# Patient Record
Sex: Male | Born: 1935
Health system: Southern US, Community
[De-identification: ages and names within clinical notes are randomized; demographics above are authoritative.]

## PROBLEM LIST (undated history)

## (undated) DIAGNOSIS — Z7901 Long term (current) use of anticoagulants: Secondary | ICD-10-CM

## (undated) DIAGNOSIS — Z974 Presence of external hearing-aid: Secondary | ICD-10-CM

## (undated) DIAGNOSIS — G709 Myoneural disorder, unspecified: Secondary | ICD-10-CM

## (undated) DIAGNOSIS — N183 Chronic kidney disease, stage 3 unspecified: Secondary | ICD-10-CM

## (undated) DIAGNOSIS — E781 Pure hyperglyceridemia: Secondary | ICD-10-CM

## (undated) DIAGNOSIS — I739 Peripheral vascular disease, unspecified: Secondary | ICD-10-CM

## (undated) DIAGNOSIS — I2510 Atherosclerotic heart disease of native coronary artery without angina pectoris: Secondary | ICD-10-CM

## (undated) DIAGNOSIS — Z972 Presence of dental prosthetic device (complete) (partial): Secondary | ICD-10-CM

## (undated) DIAGNOSIS — Z87442 Personal history of urinary calculi: Secondary | ICD-10-CM

## (undated) DIAGNOSIS — I10 Essential (primary) hypertension: Secondary | ICD-10-CM

## (undated) DIAGNOSIS — I4819 Other persistent atrial fibrillation: Secondary | ICD-10-CM

## (undated) DIAGNOSIS — H269 Unspecified cataract: Secondary | ICD-10-CM

## (undated) DIAGNOSIS — I4892 Unspecified atrial flutter: Secondary | ICD-10-CM

## (undated) DIAGNOSIS — G4733 Obstructive sleep apnea (adult) (pediatric): Secondary | ICD-10-CM

## (undated) HISTORY — DX: Atherosclerotic heart disease of native coronary artery without angina pectoris: I25.10

## (undated) HISTORY — PX: FRACTURE SURGERY: SHX138

## (undated) HISTORY — DX: Long term (current) use of anticoagulants: Z79.01

## (undated) HISTORY — DX: Pure hyperglyceridemia: E78.1

## (undated) HISTORY — DX: Chronic kidney disease, stage 3 (moderate): N18.3

## (undated) HISTORY — PX: OTHER SURGICAL HISTORY: SHX169

## (undated) HISTORY — DX: Myoneural disorder, unspecified: G70.9

## (undated) HISTORY — DX: Chronic kidney disease, stage 3 unspecified: N18.30

## (undated) HISTORY — DX: Peripheral vascular disease, unspecified: I73.9

## (undated) HISTORY — PX: CARPAL TUNNEL RELEASE: SHX101

## (undated) HISTORY — DX: Essential (primary) hypertension: I10

## (undated) HISTORY — DX: Obstructive sleep apnea (adult) (pediatric): G47.33

## (undated) HISTORY — PX: BLEPHAROPLASTY: SUR158

## (undated) HISTORY — DX: Unspecified atrial flutter: I48.92

## (undated) HISTORY — DX: Other persistent atrial fibrillation: I48.19

## (undated) HISTORY — DX: Unspecified cataract: H26.9

---

## 1997-08-10 HISTORY — PX: ANGIOPLASTY: SHX39

## 1997-11-19 ENCOUNTER — Encounter: Payer: Self-pay | Admitting: Family Medicine

## 1997-11-19 LAB — CONVERTED CEMR LAB: PSA: 0.4 ng/mL

## 1998-08-10 HISTORY — PX: CORONARY ARTERY BYPASS GRAFT: SHX141

## 1998-08-10 HISTORY — PX: CARDIAC CATHETERIZATION: SHX172

## 1998-09-10 ENCOUNTER — Observation Stay (HOSPITAL_COMMUNITY): Admission: AD | Admit: 1998-09-10 | Discharge: 1998-09-11 | Payer: Self-pay | Admitting: *Deleted

## 1998-09-18 ENCOUNTER — Encounter: Payer: Self-pay | Admitting: Emergency Medicine

## 1998-09-18 ENCOUNTER — Emergency Department (HOSPITAL_COMMUNITY): Admission: EM | Admit: 1998-09-18 | Discharge: 1998-09-18 | Payer: Self-pay | Admitting: Emergency Medicine

## 1999-01-20 ENCOUNTER — Emergency Department (HOSPITAL_COMMUNITY): Admission: EM | Admit: 1999-01-20 | Discharge: 1999-01-20 | Payer: Self-pay | Admitting: Emergency Medicine

## 1999-01-29 ENCOUNTER — Emergency Department (HOSPITAL_COMMUNITY): Admission: EM | Admit: 1999-01-29 | Discharge: 1999-01-29 | Payer: Self-pay | Admitting: Emergency Medicine

## 1999-04-15 ENCOUNTER — Inpatient Hospital Stay (HOSPITAL_COMMUNITY): Admission: AD | Admit: 1999-04-15 | Discharge: 1999-04-28 | Payer: Self-pay | Admitting: Cardiology

## 1999-04-16 ENCOUNTER — Encounter: Payer: Self-pay | Admitting: Surgery

## 1999-04-17 ENCOUNTER — Encounter: Payer: Self-pay | Admitting: Surgery

## 1999-04-18 ENCOUNTER — Encounter: Payer: Self-pay | Admitting: Surgery

## 1999-04-19 ENCOUNTER — Encounter: Payer: Self-pay | Admitting: Surgery

## 2000-05-12 ENCOUNTER — Encounter: Payer: Self-pay | Admitting: Internal Medicine

## 2000-05-12 ENCOUNTER — Ambulatory Visit (HOSPITAL_COMMUNITY): Admission: RE | Admit: 2000-05-12 | Discharge: 2000-05-13 | Payer: Self-pay | Admitting: Internal Medicine

## 2001-11-08 HISTORY — PX: CORONARY ANGIOPLASTY: SHX604

## 2001-11-29 ENCOUNTER — Inpatient Hospital Stay (HOSPITAL_COMMUNITY): Admission: AD | Admit: 2001-11-29 | Discharge: 2001-12-06 | Payer: Self-pay | Admitting: Cardiology

## 2001-11-29 ENCOUNTER — Encounter: Payer: Self-pay | Admitting: Cardiology

## 2002-08-10 HISTORY — PX: CARDIOVERSION: SHX1299

## 2002-09-07 ENCOUNTER — Ambulatory Visit (HOSPITAL_COMMUNITY): Admission: RE | Admit: 2002-09-07 | Discharge: 2002-09-07 | Payer: Self-pay | Admitting: Cardiology

## 2003-12-26 ENCOUNTER — Encounter: Admission: RE | Admit: 2003-12-26 | Discharge: 2003-12-26 | Payer: Self-pay | Admitting: Cardiology

## 2004-01-08 ENCOUNTER — Ambulatory Visit (HOSPITAL_COMMUNITY): Admission: RE | Admit: 2004-01-08 | Discharge: 2004-01-08 | Payer: Self-pay | Admitting: Cardiology

## 2004-01-10 ENCOUNTER — Emergency Department (HOSPITAL_COMMUNITY): Admission: EM | Admit: 2004-01-10 | Discharge: 2004-01-10 | Payer: Self-pay | Admitting: Emergency Medicine

## 2004-06-11 ENCOUNTER — Ambulatory Visit: Payer: Self-pay | Admitting: *Deleted

## 2004-07-02 ENCOUNTER — Ambulatory Visit: Payer: Self-pay | Admitting: Cardiology

## 2004-07-16 ENCOUNTER — Ambulatory Visit: Payer: Self-pay | Admitting: *Deleted

## 2004-07-22 ENCOUNTER — Ambulatory Visit: Payer: Self-pay | Admitting: Cardiology

## 2004-08-06 ENCOUNTER — Ambulatory Visit: Payer: Self-pay | Admitting: Internal Medicine

## 2004-08-20 ENCOUNTER — Ambulatory Visit: Payer: Self-pay | Admitting: Internal Medicine

## 2004-09-03 ENCOUNTER — Ambulatory Visit: Payer: Self-pay | Admitting: Cardiology

## 2004-09-16 ENCOUNTER — Encounter: Payer: Self-pay | Admitting: Family Medicine

## 2004-09-16 ENCOUNTER — Ambulatory Visit: Payer: Self-pay | Admitting: Family Medicine

## 2004-09-16 LAB — CONVERTED CEMR LAB: PSA: 0.55 ng/mL

## 2004-10-01 ENCOUNTER — Ambulatory Visit: Payer: Self-pay | Admitting: Cardiology

## 2004-10-08 HISTORY — PX: OTHER SURGICAL HISTORY: SHX169

## 2004-10-16 ENCOUNTER — Ambulatory Visit: Payer: Self-pay | Admitting: Cardiology

## 2004-10-22 ENCOUNTER — Ambulatory Visit: Payer: Self-pay

## 2004-10-29 ENCOUNTER — Ambulatory Visit: Payer: Self-pay | Admitting: Cardiovascular Disease

## 2004-11-26 ENCOUNTER — Ambulatory Visit: Payer: Self-pay | Admitting: Internal Medicine

## 2004-12-15 ENCOUNTER — Ambulatory Visit: Payer: Self-pay | Admitting: Family Medicine

## 2004-12-24 ENCOUNTER — Ambulatory Visit: Payer: Self-pay | Admitting: Cardiovascular Disease

## 2004-12-30 ENCOUNTER — Ambulatory Visit: Payer: Self-pay | Admitting: Internal Medicine

## 2005-01-14 ENCOUNTER — Ambulatory Visit: Payer: Self-pay | Admitting: Cardiovascular Disease

## 2005-01-30 ENCOUNTER — Ambulatory Visit: Payer: Self-pay | Admitting: Cardiology

## 2005-02-20 ENCOUNTER — Ambulatory Visit: Payer: Self-pay | Admitting: Cardiovascular Disease

## 2005-03-20 ENCOUNTER — Ambulatory Visit: Payer: Self-pay | Admitting: Cardiology

## 2005-04-15 ENCOUNTER — Ambulatory Visit: Payer: Self-pay | Admitting: Cardiology

## 2005-04-15 ENCOUNTER — Ambulatory Visit: Payer: Self-pay | Admitting: Cardiovascular Disease

## 2005-05-13 ENCOUNTER — Ambulatory Visit: Payer: Self-pay | Admitting: Cardiology

## 2005-05-27 ENCOUNTER — Ambulatory Visit: Payer: Self-pay | Admitting: Cardiovascular Disease

## 2005-06-10 ENCOUNTER — Ambulatory Visit: Payer: Self-pay | Admitting: Family Medicine

## 2005-06-17 ENCOUNTER — Ambulatory Visit: Payer: Self-pay | Admitting: Cardiology

## 2005-06-30 ENCOUNTER — Ambulatory Visit: Payer: Self-pay | Admitting: Cardiology

## 2005-07-07 ENCOUNTER — Ambulatory Visit: Payer: Self-pay | Admitting: *Deleted

## 2005-07-21 ENCOUNTER — Ambulatory Visit: Payer: Self-pay | Admitting: Cardiology

## 2005-08-05 ENCOUNTER — Ambulatory Visit: Payer: Self-pay | Admitting: Cardiology

## 2005-08-07 ENCOUNTER — Ambulatory Visit: Payer: Self-pay | Admitting: Family Medicine

## 2005-08-17 ENCOUNTER — Ambulatory Visit: Payer: Self-pay | Admitting: Family Medicine

## 2005-08-26 ENCOUNTER — Ambulatory Visit: Payer: Self-pay | Admitting: Cardiovascular Disease

## 2005-09-10 ENCOUNTER — Ambulatory Visit: Payer: Self-pay | Admitting: Cardiology

## 2005-10-01 ENCOUNTER — Ambulatory Visit: Payer: Self-pay | Admitting: Cardiology

## 2005-10-15 ENCOUNTER — Ambulatory Visit: Payer: Self-pay | Admitting: Cardiology

## 2005-10-29 ENCOUNTER — Ambulatory Visit: Payer: Self-pay | Admitting: Cardiology

## 2005-11-12 ENCOUNTER — Ambulatory Visit: Payer: Self-pay | Admitting: Cardiology

## 2005-11-26 ENCOUNTER — Ambulatory Visit: Payer: Self-pay | Admitting: Cardiology

## 2005-12-08 HISTORY — PX: CARDIOVERSION: SHX1299

## 2005-12-10 ENCOUNTER — Ambulatory Visit: Payer: Self-pay | Admitting: Cardiology

## 2005-12-10 ENCOUNTER — Inpatient Hospital Stay (HOSPITAL_COMMUNITY): Admission: EM | Admit: 2005-12-10 | Discharge: 2005-12-11 | Payer: Self-pay | Admitting: Emergency Medicine

## 2005-12-10 ENCOUNTER — Ambulatory Visit: Payer: Self-pay | Admitting: Family Medicine

## 2005-12-17 ENCOUNTER — Ambulatory Visit: Payer: Self-pay | Admitting: Cardiology

## 2006-01-05 ENCOUNTER — Ambulatory Visit: Payer: Self-pay | Admitting: Cardiology

## 2006-01-05 ENCOUNTER — Ambulatory Visit: Payer: Self-pay | Admitting: Internal Medicine

## 2006-01-19 ENCOUNTER — Ambulatory Visit: Payer: Self-pay | Admitting: *Deleted

## 2006-01-27 ENCOUNTER — Ambulatory Visit: Payer: Self-pay | Admitting: Internal Medicine

## 2006-02-16 ENCOUNTER — Ambulatory Visit: Payer: Self-pay | Admitting: Cardiology

## 2006-03-16 ENCOUNTER — Ambulatory Visit: Payer: Self-pay | Admitting: Cardiology

## 2006-04-06 ENCOUNTER — Ambulatory Visit: Payer: Self-pay | Admitting: Family Medicine

## 2006-04-13 ENCOUNTER — Ambulatory Visit: Payer: Self-pay | Admitting: Cardiovascular Disease

## 2006-04-20 ENCOUNTER — Ambulatory Visit: Payer: Self-pay | Admitting: Cardiology

## 2006-04-21 ENCOUNTER — Ambulatory Visit: Payer: Self-pay | Admitting: Family Medicine

## 2006-04-27 ENCOUNTER — Ambulatory Visit: Payer: Self-pay | Admitting: Cardiology

## 2006-04-28 ENCOUNTER — Ambulatory Visit: Payer: Self-pay | Admitting: Family Medicine

## 2006-05-25 ENCOUNTER — Ambulatory Visit: Payer: Self-pay | Admitting: Cardiology

## 2006-05-26 ENCOUNTER — Ambulatory Visit: Payer: Self-pay | Admitting: Family Medicine

## 2006-06-08 ENCOUNTER — Ambulatory Visit: Payer: Self-pay | Admitting: Cardiology

## 2006-07-06 ENCOUNTER — Ambulatory Visit: Payer: Self-pay | Admitting: Cardiology

## 2006-07-16 ENCOUNTER — Ambulatory Visit: Payer: Self-pay | Admitting: Cardiology

## 2006-07-27 ENCOUNTER — Emergency Department (HOSPITAL_COMMUNITY): Admission: EM | Admit: 2006-07-27 | Discharge: 2006-07-27 | Payer: Self-pay | Admitting: Emergency Medicine

## 2006-07-29 ENCOUNTER — Ambulatory Visit: Payer: Self-pay | Admitting: Cardiology

## 2006-07-30 ENCOUNTER — Ambulatory Visit: Payer: Self-pay | Admitting: Cardiovascular Disease

## 2006-08-16 ENCOUNTER — Ambulatory Visit: Payer: Self-pay | Admitting: Family Medicine

## 2006-08-18 ENCOUNTER — Ambulatory Visit: Payer: Self-pay

## 2006-08-20 ENCOUNTER — Ambulatory Visit: Payer: Self-pay | Admitting: Cardiology

## 2006-08-27 ENCOUNTER — Ambulatory Visit: Payer: Self-pay | Admitting: Cardiovascular Disease

## 2006-09-07 ENCOUNTER — Ambulatory Visit: Payer: Self-pay | Admitting: Family Medicine

## 2006-09-07 LAB — CONVERTED CEMR LAB
AST: 34 units/L (ref 0–37)
BUN: 15 mg/dL (ref 6–23)
Basophils Absolute: 0.1 10*3/uL (ref 0.0–0.1)
CO2: 29 meq/L (ref 19–32)
Chloride: 106 meq/L (ref 96–112)
Creatinine, Ser: 1.3 mg/dL (ref 0.4–1.5)
Eosinophils Absolute: 0.1 10*3/uL (ref 0.0–0.6)
GFR calc non Af Amer: 57 mL/min
Glucose, Bld: 135 mg/dL — ABNORMAL HIGH (ref 70–99)
HDL: 28.3 mg/dL — ABNORMAL LOW (ref >39.0)
MCV: 90.1 fL (ref 78.0–100.0)
Monocytes Relative: 8.9 % (ref 3.0–11.0)
Platelets: 154 10*3/uL (ref 150–400)
Potassium: 5.1 meq/L (ref 3.5–5.1)
Sodium: 140 meq/L (ref 135–145)
TSH: 1.88 microintl units/mL (ref 0.35–5.50)
Total CHOL/HDL Ratio: 4.7
Total Protein: 6.4 g/dL (ref 6.0–8.3)
Triglycerides: 460 mg/dL (ref 0–149)
WBC: 6.3 10*3/uL (ref 4.5–10.5)

## 2006-09-10 ENCOUNTER — Ambulatory Visit: Payer: Self-pay | Admitting: Family Medicine

## 2006-09-10 ENCOUNTER — Ambulatory Visit: Payer: Self-pay | Admitting: Cardiology

## 2006-09-21 ENCOUNTER — Ambulatory Visit: Payer: Self-pay | Admitting: Gastroenterology

## 2006-10-01 ENCOUNTER — Encounter (INDEPENDENT_AMBULATORY_CARE_PROVIDER_SITE_OTHER): Payer: Self-pay | Admitting: Specialist

## 2006-10-01 ENCOUNTER — Ambulatory Visit: Payer: Self-pay | Admitting: Gastroenterology

## 2006-10-01 ENCOUNTER — Encounter (INDEPENDENT_AMBULATORY_CARE_PROVIDER_SITE_OTHER): Payer: Self-pay | Admitting: *Deleted

## 2006-10-01 HISTORY — PX: COLONOSCOPY W/ BIOPSIES: SHX1374

## 2006-10-08 ENCOUNTER — Ambulatory Visit: Payer: Self-pay | Admitting: Cardiology

## 2006-10-15 ENCOUNTER — Ambulatory Visit: Payer: Self-pay | Admitting: Family Medicine

## 2006-10-15 LAB — CONVERTED CEMR LAB
ALT: 30 units/L (ref 0–40)
HDL: 31.5 mg/dL — ABNORMAL LOW (ref >39.0)
Hgb A1c MFr Bld: 7 %
Hgb A1c MFr Bld: 7 % — ABNORMAL HIGH (ref 4.6–6.0)
Total CHOL/HDL Ratio: 4.2
Triglycerides: 292 mg/dL (ref 0–149)
VLDL: 58 mg/dL — ABNORMAL HIGH (ref 0–40)

## 2006-10-22 ENCOUNTER — Ambulatory Visit: Payer: Self-pay | Admitting: Cardiology

## 2006-11-02 ENCOUNTER — Ambulatory Visit: Payer: Self-pay | Admitting: Family Medicine

## 2006-11-19 ENCOUNTER — Ambulatory Visit: Payer: Self-pay | Admitting: Internal Medicine

## 2006-11-24 ENCOUNTER — Encounter: Payer: Self-pay | Admitting: Family Medicine

## 2006-11-24 DIAGNOSIS — E781 Pure hyperglyceridemia: Secondary | ICD-10-CM

## 2006-11-24 DIAGNOSIS — E785 Hyperlipidemia, unspecified: Secondary | ICD-10-CM | POA: Insufficient documentation

## 2006-11-24 DIAGNOSIS — I10 Essential (primary) hypertension: Secondary | ICD-10-CM

## 2006-11-24 DIAGNOSIS — E1140 Type 2 diabetes mellitus with diabetic neuropathy, unspecified: Secondary | ICD-10-CM

## 2006-11-26 ENCOUNTER — Ambulatory Visit: Payer: Self-pay | Admitting: Cardiology

## 2006-12-08 ENCOUNTER — Ambulatory Visit: Payer: Self-pay | Admitting: Internal Medicine

## 2006-12-13 ENCOUNTER — Ambulatory Visit: Payer: Self-pay | Admitting: Family Medicine

## 2006-12-13 LAB — CONVERTED CEMR LAB
BUN: 15 mg/dL (ref 6–23)
CO2: 29 meq/L (ref 19–32)
Calcium: 8.8 mg/dL (ref 8.4–10.5)
Chloride: 106 meq/L (ref 96–112)
GFR calc Af Amer: 76 mL/min
GFR calc non Af Amer: 63 mL/min
Potassium: 4.6 meq/L (ref 3.5–5.1)
Sodium: 141 meq/L (ref 135–145)

## 2006-12-15 ENCOUNTER — Ambulatory Visit: Payer: Self-pay | Admitting: Family Medicine

## 2006-12-22 ENCOUNTER — Ambulatory Visit: Payer: Self-pay | Admitting: Cardiovascular Disease

## 2007-01-10 ENCOUNTER — Ambulatory Visit: Payer: Self-pay | Admitting: Internal Medicine

## 2007-02-08 ENCOUNTER — Ambulatory Visit: Payer: Self-pay | Admitting: Cardiology

## 2007-02-18 ENCOUNTER — Encounter: Payer: Self-pay | Admitting: Family Medicine

## 2007-02-18 ENCOUNTER — Ambulatory Visit: Payer: Self-pay

## 2007-02-18 HISTORY — PX: OTHER SURGICAL HISTORY: SHX169

## 2007-03-10 ENCOUNTER — Ambulatory Visit: Payer: Self-pay | Admitting: Cardiology

## 2007-03-11 ENCOUNTER — Ambulatory Visit: Payer: Self-pay | Admitting: Family Medicine

## 2007-03-11 LAB — CONVERTED CEMR LAB
Calcium: 9.2 mg/dL (ref 8.4–10.5)
Hgb A1c MFr Bld: 6.2 % — ABNORMAL HIGH (ref 4.6–6.0)
Sodium: 142 meq/L (ref 135–145)

## 2007-03-18 ENCOUNTER — Ambulatory Visit: Payer: Self-pay | Admitting: Family Medicine

## 2007-04-12 ENCOUNTER — Ambulatory Visit: Payer: Self-pay | Admitting: Internal Medicine

## 2007-05-10 ENCOUNTER — Ambulatory Visit: Payer: Self-pay | Admitting: Cardiology

## 2007-05-20 ENCOUNTER — Ambulatory Visit: Payer: Self-pay | Admitting: Family Medicine

## 2007-06-06 ENCOUNTER — Ambulatory Visit: Payer: Self-pay | Admitting: Cardiology

## 2007-06-17 ENCOUNTER — Ambulatory Visit: Payer: Self-pay | Admitting: Family Medicine

## 2007-06-28 ENCOUNTER — Ambulatory Visit: Payer: Self-pay | Admitting: Family Medicine

## 2007-07-06 ENCOUNTER — Ambulatory Visit: Payer: Self-pay | Admitting: Cardiology

## 2007-07-13 ENCOUNTER — Telehealth (INDEPENDENT_AMBULATORY_CARE_PROVIDER_SITE_OTHER): Payer: Self-pay | Admitting: *Deleted

## 2007-07-18 ENCOUNTER — Encounter: Payer: Self-pay | Admitting: Family Medicine

## 2007-07-21 ENCOUNTER — Telehealth (INDEPENDENT_AMBULATORY_CARE_PROVIDER_SITE_OTHER): Payer: Self-pay | Admitting: *Deleted

## 2007-07-27 ENCOUNTER — Ambulatory Visit: Payer: Self-pay | Admitting: Family Medicine

## 2007-07-27 ENCOUNTER — Ambulatory Visit: Payer: Self-pay | Admitting: Cardiology

## 2007-08-16 ENCOUNTER — Ambulatory Visit: Payer: Self-pay | Admitting: Cardiology

## 2007-08-23 ENCOUNTER — Ambulatory Visit: Payer: Self-pay | Admitting: Cardiovascular Disease

## 2007-09-14 ENCOUNTER — Ambulatory Visit: Payer: Self-pay | Admitting: Family Medicine

## 2007-09-14 LAB — CONVERTED CEMR LAB
AST: 37 units/L (ref 0–37)
Bilirubin, Direct: 0.1 mg/dL (ref 0.0–0.3)
Calcium: 9.6 mg/dL (ref 8.4–10.5)
Chloride: 103 meq/L (ref 96–112)
Cholesterol: 145 mg/dL (ref 0–200)
Creatinine, Ser: 1.1 mg/dL (ref 0.4–1.5)
Creatinine,U: 77.8 mg/dL
HDL: 34.2 mg/dL — ABNORMAL LOW (ref >39.0)
Microalb Creat Ratio: 60.4 mg/g — ABNORMAL HIGH (ref 0.0–30.0)
Microalb, Ur: 4.7 mg/dL — ABNORMAL HIGH (ref 0.0–1.9)
Potassium: 4.7 meq/L (ref 3.5–5.1)
Total Bilirubin: 1.1 mg/dL (ref 0.3–1.2)
Total Protein: 7.2 g/dL (ref 6.0–8.3)
Triglycerides: 291 mg/dL (ref 0–149)
VLDL: 58 mg/dL — ABNORMAL HIGH (ref 0–40)

## 2007-09-20 ENCOUNTER — Ambulatory Visit: Payer: Self-pay | Admitting: Internal Medicine

## 2007-10-12 ENCOUNTER — Ambulatory Visit: Payer: Self-pay | Admitting: Internal Medicine

## 2007-10-26 ENCOUNTER — Ambulatory Visit: Payer: Self-pay | Admitting: Cardiology

## 2007-10-27 ENCOUNTER — Ambulatory Visit: Payer: Self-pay | Admitting: Family Medicine

## 2007-11-16 ENCOUNTER — Ambulatory Visit: Payer: Self-pay | Admitting: Internal Medicine

## 2007-11-29 ENCOUNTER — Ambulatory Visit: Payer: Self-pay | Admitting: Cardiovascular Disease

## 2007-12-13 ENCOUNTER — Ambulatory Visit: Payer: Self-pay | Admitting: Cardiology

## 2007-12-22 ENCOUNTER — Ambulatory Visit: Payer: Self-pay | Admitting: Family Medicine

## 2007-12-22 LAB — CONVERTED CEMR LAB
Chloride: 103 meq/L (ref 96–112)
Creatinine, Ser: 1.1 mg/dL (ref 0.4–1.5)
GFR calc Af Amer: 83 mL/min
Glucose, Bld: 106 mg/dL — ABNORMAL HIGH (ref 70–99)
Potassium: 4.7 meq/L (ref 3.5–5.1)

## 2007-12-27 ENCOUNTER — Ambulatory Visit: Payer: Self-pay | Admitting: Internal Medicine

## 2008-01-17 ENCOUNTER — Ambulatory Visit: Payer: Self-pay | Admitting: Cardiovascular Disease

## 2008-02-01 ENCOUNTER — Telehealth: Payer: Self-pay | Admitting: Family Medicine

## 2008-02-07 ENCOUNTER — Ambulatory Visit: Payer: Self-pay | Admitting: Family Medicine

## 2008-02-07 LAB — CONVERTED CEMR LAB
ALT: 23 units/L (ref 0–53)
Alkaline Phosphatase: 42 units/L (ref 39–117)
Basophils Absolute: 0 10*3/uL (ref 0.0–0.1)
Basophils Relative: 0.5 % (ref 0.0–1.0)
Chloride: 98 meq/L (ref 96–112)
Eosinophils Relative: 0.5 % (ref 0.0–5.0)
Glucose, Bld: 133 mg/dL
Lymphocytes Relative: 8.1 % — ABNORMAL LOW (ref 12.0–46.0)
Monocytes Relative: 7.9 % (ref 3.0–12.0)
Neutro Abs: 8 10*3/uL — ABNORMAL HIGH (ref 1.4–7.7)
Potassium: 4.3 meq/L (ref 3.5–5.1)
Protein, U semiquant: 30
Sodium: 133 meq/L — ABNORMAL LOW (ref 135–145)
Specific Gravity, Urine: 1.01
Total Protein: 7 g/dL (ref 6.0–8.3)
Urobilinogen, UA: 0.2
WBC Urine, dipstick: NEGATIVE
WBC: 9.6 10*3/uL (ref 4.5–10.5)

## 2008-02-08 ENCOUNTER — Encounter: Payer: Self-pay | Admitting: Family Medicine

## 2008-02-08 ENCOUNTER — Telehealth: Payer: Self-pay | Admitting: Family Medicine

## 2008-02-09 ENCOUNTER — Ambulatory Visit: Payer: Self-pay | Admitting: Cardiology

## 2008-02-14 ENCOUNTER — Ambulatory Visit: Payer: Self-pay | Admitting: Cardiology

## 2008-03-08 ENCOUNTER — Ambulatory Visit: Payer: Self-pay | Admitting: Internal Medicine

## 2008-03-12 ENCOUNTER — Ambulatory Visit: Payer: Self-pay | Admitting: Family Medicine

## 2008-03-16 ENCOUNTER — Ambulatory Visit: Payer: Self-pay | Admitting: Cardiology

## 2008-03-26 ENCOUNTER — Ambulatory Visit: Payer: Self-pay | Admitting: Cardiology

## 2008-03-26 ENCOUNTER — Ambulatory Visit: Payer: Self-pay | Admitting: Internal Medicine

## 2008-03-26 LAB — CONVERTED CEMR LAB
CO2: 32 meq/L (ref 19–32)
Chloride: 105 meq/L (ref 96–112)
Sodium: 140 meq/L (ref 135–145)

## 2008-03-27 ENCOUNTER — Ambulatory Visit: Payer: Self-pay | Admitting: Family Medicine

## 2008-04-09 ENCOUNTER — Ambulatory Visit: Payer: Self-pay | Admitting: Internal Medicine

## 2008-04-30 ENCOUNTER — Ambulatory Visit: Payer: Self-pay | Admitting: Internal Medicine

## 2008-05-15 ENCOUNTER — Ambulatory Visit: Payer: Self-pay | Admitting: Cardiology

## 2008-05-21 ENCOUNTER — Ambulatory Visit: Payer: Self-pay | Admitting: Family Medicine

## 2008-06-12 ENCOUNTER — Ambulatory Visit: Payer: Self-pay | Admitting: Internal Medicine

## 2008-07-03 ENCOUNTER — Ambulatory Visit: Payer: Self-pay | Admitting: Cardiovascular Disease

## 2008-07-17 ENCOUNTER — Ambulatory Visit: Payer: Self-pay | Admitting: Cardiology

## 2008-08-07 ENCOUNTER — Ambulatory Visit: Payer: Self-pay | Admitting: Cardiology

## 2008-08-21 ENCOUNTER — Ambulatory Visit: Payer: Self-pay | Admitting: Internal Medicine

## 2008-09-04 ENCOUNTER — Ambulatory Visit: Payer: Self-pay | Admitting: Cardiology

## 2008-09-12 ENCOUNTER — Ambulatory Visit: Payer: Self-pay | Admitting: Family Medicine

## 2008-09-12 ENCOUNTER — Ambulatory Visit: Payer: Self-pay | Admitting: Cardiology

## 2008-09-12 LAB — CONVERTED CEMR LAB
AST: 35 units/L (ref 0–37)
BUN: 18 mg/dL (ref 6–23)
Basophils Absolute: 0 10*3/uL (ref 0.0–0.1)
Bilirubin, Direct: 0.1 mg/dL (ref 0.0–0.3)
CO2: 31 meq/L (ref 19–32)
Cholesterol: 156 mg/dL (ref 0–200)
Creatinine, Ser: 1.1 mg/dL (ref 0.4–1.5)
Direct LDL: 45.6 mg/dL
Eosinophils Absolute: 0.2 10*3/uL (ref 0.0–0.7)
Glucose, Bld: 117 mg/dL — ABNORMAL HIGH (ref 70–99)
Hemoglobin: 14.7 g/dL (ref 13.0–17.0)
Lymphocytes Relative: 27.8 % (ref 12.0–46.0)
Microalb Creat Ratio: 201.6 mg/g — ABNORMAL HIGH (ref 0.0–30.0)
Monocytes Absolute: 0.6 10*3/uL (ref 0.1–1.0)
Neutro Abs: 4.3 10*3/uL (ref 1.4–7.7)
Neutrophils Relative %: 61.2 % (ref 43.0–77.0)
Platelets: 159 10*3/uL (ref 150–400)
RDW: 13.7 % (ref 11.5–14.6)
TSH: 2.02 microintl units/mL (ref 0.35–5.50)
Total Bilirubin: 0.7 mg/dL (ref 0.3–1.2)
Total Protein: 6.8 g/dL (ref 6.0–8.3)
Triglycerides: 523 mg/dL (ref 0–149)
VLDL: 105 mg/dL — ABNORMAL HIGH (ref 0–40)
WBC: 7.1 10*3/uL (ref 4.5–10.5)

## 2008-09-18 ENCOUNTER — Ambulatory Visit: Payer: Self-pay | Admitting: Family Medicine

## 2008-09-19 ENCOUNTER — Telehealth: Payer: Self-pay | Admitting: Family Medicine

## 2008-09-24 ENCOUNTER — Ambulatory Visit: Payer: Self-pay | Admitting: Cardiology

## 2008-10-01 ENCOUNTER — Ambulatory Visit: Payer: Self-pay | Admitting: Cardiology

## 2008-10-22 ENCOUNTER — Ambulatory Visit: Payer: Self-pay | Admitting: Cardiovascular Disease

## 2008-11-01 ENCOUNTER — Ambulatory Visit: Payer: Self-pay | Admitting: Family Medicine

## 2008-11-01 LAB — CONVERTED CEMR LAB
ALT: 24 units/L (ref 0–53)
AST: 31 units/L (ref 0–37)

## 2008-11-08 ENCOUNTER — Encounter: Payer: Self-pay | Admitting: Family Medicine

## 2008-11-08 ENCOUNTER — Telehealth: Payer: Self-pay | Admitting: Family Medicine

## 2008-11-12 ENCOUNTER — Ambulatory Visit: Payer: Self-pay | Admitting: Cardiology

## 2008-12-05 ENCOUNTER — Ambulatory Visit: Payer: Self-pay | Admitting: Family Medicine

## 2008-12-10 ENCOUNTER — Ambulatory Visit: Payer: Self-pay | Admitting: Internal Medicine

## 2008-12-25 DIAGNOSIS — I4891 Unspecified atrial fibrillation: Secondary | ICD-10-CM | POA: Insufficient documentation

## 2008-12-25 DIAGNOSIS — I4892 Unspecified atrial flutter: Secondary | ICD-10-CM

## 2008-12-31 ENCOUNTER — Ambulatory Visit: Payer: Self-pay | Admitting: Cardiology

## 2009-01-08 ENCOUNTER — Encounter: Payer: Self-pay | Admitting: *Deleted

## 2009-01-10 ENCOUNTER — Ambulatory Visit: Payer: Self-pay | Admitting: Cardiology

## 2009-01-10 LAB — CONVERTED CEMR LAB: POC INR: 2.7

## 2009-02-07 ENCOUNTER — Ambulatory Visit: Payer: Self-pay | Admitting: Internal Medicine

## 2009-02-08 ENCOUNTER — Ambulatory Visit: Payer: Self-pay | Admitting: Cardiology

## 2009-02-13 ENCOUNTER — Encounter: Payer: Self-pay | Admitting: *Deleted

## 2009-02-15 ENCOUNTER — Ambulatory Visit: Payer: Self-pay

## 2009-02-22 ENCOUNTER — Ambulatory Visit: Payer: Self-pay | Admitting: Cardiology

## 2009-02-27 ENCOUNTER — Ambulatory Visit: Payer: Self-pay | Admitting: Family Medicine

## 2009-02-27 LAB — CONVERTED CEMR LAB
BUN: 26 mg/dL — ABNORMAL HIGH (ref 6–23)
CO2: 28 meq/L (ref 19–32)
Chloride: 106 meq/L (ref 96–112)
GFR calc non Af Amer: 44.55 mL/min (ref >60)
Glucose, Bld: 151 mg/dL — ABNORMAL HIGH (ref 70–99)
Hgb A1c MFr Bld: 6.2 % (ref 4.6–6.5)
Sodium: 140 meq/L (ref 135–145)
Vitamin B-12: 119 pg/mL — ABNORMAL LOW (ref 211–911)

## 2009-03-04 ENCOUNTER — Ambulatory Visit: Payer: Self-pay | Admitting: Family Medicine

## 2009-03-04 DIAGNOSIS — E538 Deficiency of other specified B group vitamins: Secondary | ICD-10-CM

## 2009-03-07 ENCOUNTER — Ambulatory Visit: Payer: Self-pay | Admitting: Cardiovascular Disease

## 2009-03-07 LAB — CONVERTED CEMR LAB
POC INR: 3.3
Prothrombin Time: 21.8 s

## 2009-04-04 ENCOUNTER — Ambulatory Visit: Payer: Self-pay | Admitting: Internal Medicine

## 2009-04-04 LAB — CONVERTED CEMR LAB: POC INR: 3.2

## 2009-04-05 ENCOUNTER — Ambulatory Visit: Payer: Self-pay | Admitting: Family Medicine

## 2009-04-06 LAB — CONVERTED CEMR LAB
HDL: 28.8 mg/dL — ABNORMAL LOW (ref >39.00)
Total CHOL/HDL Ratio: 6
Triglycerides: 168 mg/dL — ABNORMAL HIGH (ref 0.0–149.0)

## 2009-04-17 ENCOUNTER — Ambulatory Visit: Payer: Self-pay | Admitting: Family Medicine

## 2009-04-18 ENCOUNTER — Ambulatory Visit: Payer: Self-pay | Admitting: Cardiology

## 2009-05-01 ENCOUNTER — Ambulatory Visit: Payer: Self-pay | Admitting: Family Medicine

## 2009-05-16 ENCOUNTER — Ambulatory Visit: Payer: Self-pay | Admitting: Cardiology

## 2009-05-16 LAB — CONVERTED CEMR LAB: POC INR: 2.5

## 2009-05-17 ENCOUNTER — Ambulatory Visit: Payer: Self-pay | Admitting: Family Medicine

## 2009-05-21 ENCOUNTER — Ambulatory Visit: Payer: Self-pay | Admitting: Cardiology

## 2009-06-13 ENCOUNTER — Ambulatory Visit: Payer: Self-pay | Admitting: Internal Medicine

## 2009-06-13 LAB — CONVERTED CEMR LAB: POC INR: 2.7

## 2009-06-18 ENCOUNTER — Ambulatory Visit: Payer: Self-pay | Admitting: Family Medicine

## 2009-07-11 ENCOUNTER — Ambulatory Visit: Payer: Self-pay | Admitting: Cardiology

## 2009-07-19 ENCOUNTER — Ambulatory Visit: Payer: Self-pay | Admitting: Family Medicine

## 2009-07-26 ENCOUNTER — Telehealth: Payer: Self-pay | Admitting: Cardiology

## 2009-08-12 ENCOUNTER — Ambulatory Visit: Payer: Self-pay | Admitting: Cardiology

## 2009-08-12 LAB — CONVERTED CEMR LAB: POC INR: 2.3

## 2009-08-22 ENCOUNTER — Ambulatory Visit: Payer: Self-pay | Admitting: Family Medicine

## 2009-08-27 HISTORY — PX: HAND SURGERY: SHX662

## 2009-09-03 ENCOUNTER — Encounter (INDEPENDENT_AMBULATORY_CARE_PROVIDER_SITE_OTHER): Payer: Self-pay | Admitting: *Deleted

## 2009-09-09 ENCOUNTER — Encounter (INDEPENDENT_AMBULATORY_CARE_PROVIDER_SITE_OTHER): Payer: Self-pay | Admitting: Cardiology

## 2009-09-09 ENCOUNTER — Ambulatory Visit: Payer: Self-pay | Admitting: Cardiology

## 2009-09-09 LAB — CONVERTED CEMR LAB: POC INR: 1.7

## 2009-09-17 ENCOUNTER — Encounter (INDEPENDENT_AMBULATORY_CARE_PROVIDER_SITE_OTHER): Payer: Self-pay | Admitting: *Deleted

## 2009-09-20 ENCOUNTER — Ambulatory Visit: Payer: Self-pay | Admitting: Family Medicine

## 2009-09-21 LAB — CONVERTED CEMR LAB
AST: 36 units/L (ref 0–37)
Alkaline Phosphatase: 27 units/L — ABNORMAL LOW (ref 39–117)
Basophils Relative: 2.7 % (ref 0.0–3.0)
CO2: 27 meq/L (ref 19–32)
Cholesterol: 175 mg/dL (ref 0–200)
Creatinine,U: 85.6 mg/dL
GFR calc non Af Amer: 41.48 mL/min (ref >60)
Glucose, Bld: 104 mg/dL — ABNORMAL HIGH (ref 70–99)
HDL: 34.2 mg/dL — ABNORMAL LOW (ref >39.00)
Hemoglobin: 14.2 g/dL (ref 13.0–17.0)
Lymphocytes Relative: 26.2 % (ref 12.0–46.0)
MCV: 95.4 fL (ref 78.0–100.0)
Microalb, Ur: 3.4 mg/dL — ABNORMAL HIGH (ref 0.0–1.9)
Monocytes Relative: 6.7 % (ref 3.0–12.0)
Neutro Abs: 4.3 10*3/uL (ref 1.4–7.7)
Neutrophils Relative %: 61.9 % (ref 43.0–77.0)
PSA: 0.6 ng/mL (ref 0.10–4.00)
Platelets: 167 10*3/uL (ref 150.0–400.0)
Potassium: 5 meq/L (ref 3.5–5.1)
RDW: 14.1 % (ref 11.5–14.6)
Total Bilirubin: 0.4 mg/dL (ref 0.3–1.2)
Total CHOL/HDL Ratio: 5
Total Protein: 7 g/dL (ref 6.0–8.3)
Triglycerides: 203 mg/dL — ABNORMAL HIGH (ref 0.0–149.0)
Vit D, 25-Hydroxy: 26 ng/mL — ABNORMAL LOW (ref 30–89)
Vitamin B-12: 283 pg/mL (ref 211–911)

## 2009-09-25 ENCOUNTER — Ambulatory Visit: Payer: Self-pay | Admitting: Family Medicine

## 2009-09-30 ENCOUNTER — Telehealth: Payer: Self-pay | Admitting: Family Medicine

## 2009-09-30 ENCOUNTER — Ambulatory Visit: Payer: Self-pay | Admitting: Cardiovascular Disease

## 2009-09-30 LAB — CONVERTED CEMR LAB: POC INR: 3.4

## 2009-10-15 ENCOUNTER — Telehealth: Payer: Self-pay | Admitting: Family Medicine

## 2009-10-21 ENCOUNTER — Ambulatory Visit: Payer: Self-pay | Admitting: Cardiology

## 2009-10-25 ENCOUNTER — Encounter (INDEPENDENT_AMBULATORY_CARE_PROVIDER_SITE_OTHER): Payer: Self-pay | Admitting: *Deleted

## 2009-10-25 ENCOUNTER — Encounter: Payer: Self-pay | Admitting: Cardiology

## 2009-10-25 ENCOUNTER — Ambulatory Visit: Payer: Self-pay | Admitting: Gastroenterology

## 2009-10-25 DIAGNOSIS — Z860100 Personal history of colon polyps, unspecified: Secondary | ICD-10-CM | POA: Insufficient documentation

## 2009-11-05 ENCOUNTER — Telehealth (INDEPENDENT_AMBULATORY_CARE_PROVIDER_SITE_OTHER): Payer: Self-pay | Admitting: *Deleted

## 2009-11-08 ENCOUNTER — Ambulatory Visit: Payer: Self-pay | Admitting: Cardiology

## 2009-11-08 HISTORY — PX: NM MYOVIEW LTD: HXRAD82

## 2009-11-13 ENCOUNTER — Telehealth (INDEPENDENT_AMBULATORY_CARE_PROVIDER_SITE_OTHER): Payer: Self-pay | Admitting: *Deleted

## 2009-11-14 ENCOUNTER — Encounter: Payer: Self-pay | Admitting: Family Medicine

## 2009-11-14 ENCOUNTER — Ambulatory Visit: Payer: Self-pay | Admitting: Cardiology

## 2009-11-14 ENCOUNTER — Encounter (HOSPITAL_COMMUNITY): Admission: RE | Admit: 2009-11-14 | Discharge: 2010-01-08 | Payer: Self-pay | Admitting: Internal Medicine

## 2009-11-14 ENCOUNTER — Ambulatory Visit: Payer: Self-pay

## 2009-11-14 ENCOUNTER — Telehealth: Payer: Self-pay | Admitting: Family Medicine

## 2009-11-21 ENCOUNTER — Ambulatory Visit: Payer: Self-pay | Admitting: Family Medicine

## 2009-11-28 ENCOUNTER — Ambulatory Visit: Payer: Self-pay | Admitting: Internal Medicine

## 2009-12-04 ENCOUNTER — Ambulatory Visit: Payer: Self-pay | Admitting: Family Medicine

## 2009-12-04 LAB — CONVERTED CEMR LAB: AST: 39 units/L — ABNORMAL HIGH (ref 0–37)

## 2009-12-16 ENCOUNTER — Ambulatory Visit: Payer: Self-pay | Admitting: Internal Medicine

## 2009-12-18 ENCOUNTER — Encounter (INDEPENDENT_AMBULATORY_CARE_PROVIDER_SITE_OTHER): Payer: Self-pay | Admitting: *Deleted

## 2009-12-20 ENCOUNTER — Ambulatory Visit: Payer: Self-pay | Admitting: Family Medicine

## 2009-12-23 LAB — CONVERTED CEMR LAB
ALT: 25 units/L (ref 0–53)
HDL: 29.5 mg/dL — ABNORMAL LOW (ref >39.00)
VLDL: 37 mg/dL (ref 0.0–40.0)

## 2009-12-24 ENCOUNTER — Ambulatory Visit: Payer: Self-pay | Admitting: Family Medicine

## 2010-01-10 ENCOUNTER — Ambulatory Visit: Payer: Self-pay | Admitting: Gastroenterology

## 2010-01-15 ENCOUNTER — Encounter: Payer: Self-pay | Admitting: Gastroenterology

## 2010-01-17 ENCOUNTER — Ambulatory Visit: Payer: Self-pay | Admitting: Cardiology

## 2010-01-27 ENCOUNTER — Ambulatory Visit: Payer: Self-pay | Admitting: Cardiology

## 2010-01-29 ENCOUNTER — Ambulatory Visit: Payer: Self-pay | Admitting: Family Medicine

## 2010-01-29 LAB — HM DIABETES FOOT EXAM

## 2010-02-17 ENCOUNTER — Ambulatory Visit: Payer: Self-pay | Admitting: Cardiology

## 2010-02-17 LAB — CONVERTED CEMR LAB: POC INR: 2.8

## 2010-03-18 ENCOUNTER — Encounter (INDEPENDENT_AMBULATORY_CARE_PROVIDER_SITE_OTHER): Payer: Self-pay | Admitting: *Deleted

## 2010-03-18 ENCOUNTER — Ambulatory Visit: Payer: Self-pay | Admitting: Cardiology

## 2010-04-16 ENCOUNTER — Ambulatory Visit: Payer: Self-pay | Admitting: Cardiovascular Disease

## 2010-05-14 ENCOUNTER — Ambulatory Visit: Payer: Self-pay | Admitting: Cardiovascular Disease

## 2010-05-14 LAB — CONVERTED CEMR LAB: POC INR: 1.2

## 2010-05-21 ENCOUNTER — Ambulatory Visit: Payer: Self-pay | Admitting: Cardiology

## 2010-05-21 LAB — CONVERTED CEMR LAB: POC INR: 2.7

## 2010-05-28 ENCOUNTER — Ambulatory Visit: Payer: Self-pay | Admitting: Internal Medicine

## 2010-05-28 ENCOUNTER — Ambulatory Visit: Payer: Self-pay | Admitting: Family Medicine

## 2010-05-30 ENCOUNTER — Encounter: Payer: Self-pay | Admitting: Family Medicine

## 2010-06-02 ENCOUNTER — Encounter: Payer: Self-pay | Admitting: Family Medicine

## 2010-06-04 ENCOUNTER — Ambulatory Visit: Payer: Self-pay | Admitting: Cardiovascular Disease

## 2010-07-02 ENCOUNTER — Ambulatory Visit: Payer: Self-pay | Admitting: Internal Medicine

## 2010-07-10 DIAGNOSIS — G4733 Obstructive sleep apnea (adult) (pediatric): Secondary | ICD-10-CM

## 2010-07-10 HISTORY — DX: Obstructive sleep apnea (adult) (pediatric): G47.33

## 2010-07-18 ENCOUNTER — Ambulatory Visit: Payer: Self-pay | Admitting: Family Medicine

## 2010-07-24 ENCOUNTER — Encounter: Payer: Self-pay | Admitting: Family Medicine

## 2010-07-30 ENCOUNTER — Telehealth (INDEPENDENT_AMBULATORY_CARE_PROVIDER_SITE_OTHER): Payer: Self-pay | Admitting: *Deleted

## 2010-07-30 ENCOUNTER — Ambulatory Visit: Payer: Self-pay | Admitting: Cardiology

## 2010-07-30 ENCOUNTER — Ambulatory Visit: Payer: Self-pay

## 2010-07-30 ENCOUNTER — Telehealth: Payer: Self-pay | Admitting: Cardiology

## 2010-07-30 ENCOUNTER — Encounter: Payer: Self-pay | Admitting: Physician Assistant

## 2010-07-30 LAB — CONVERTED CEMR LAB: POC INR: 2.5

## 2010-08-01 ENCOUNTER — Ambulatory Visit: Payer: Self-pay | Admitting: Cardiology

## 2010-08-01 LAB — CONVERTED CEMR LAB
BUN: 31 mg/dL — ABNORMAL HIGH (ref 6–23)
CO2: 27 meq/L (ref 19–32)
Calcium: 9.4 mg/dL (ref 8.4–10.5)
Chloride: 102 meq/L (ref 96–112)
Creatinine, Ser: 1.7 mg/dL — ABNORMAL HIGH (ref 0.4–1.5)

## 2010-08-02 ENCOUNTER — Encounter: Payer: Self-pay | Admitting: Cardiology

## 2010-08-07 ENCOUNTER — Ambulatory Visit (HOSPITAL_BASED_OUTPATIENT_CLINIC_OR_DEPARTMENT_OTHER)
Admission: RE | Admit: 2010-08-07 | Discharge: 2010-08-07 | Payer: Self-pay | Source: Home / Self Care | Attending: Cardiology | Admitting: Cardiology

## 2010-08-07 ENCOUNTER — Encounter: Payer: Self-pay | Admitting: Pulmonary Disease

## 2010-08-12 LAB — CONVERTED CEMR LAB: Metanephrines, Ur: 58 — ABNORMAL LOW (ref 90–315)

## 2010-08-13 ENCOUNTER — Encounter: Payer: Self-pay | Admitting: Cardiology

## 2010-08-13 ENCOUNTER — Ambulatory Visit
Admission: RE | Admit: 2010-08-13 | Discharge: 2010-08-13 | Payer: Self-pay | Source: Home / Self Care | Attending: Cardiology | Admitting: Cardiology

## 2010-08-14 ENCOUNTER — Telehealth: Payer: Self-pay | Admitting: Cardiology

## 2010-08-21 ENCOUNTER — Encounter: Payer: Self-pay | Admitting: Family Medicine

## 2010-08-22 ENCOUNTER — Ambulatory Visit: Admit: 2010-08-22 | Payer: Self-pay | Admitting: Cardiology

## 2010-08-26 ENCOUNTER — Encounter
Admission: RE | Admit: 2010-08-26 | Discharge: 2010-08-26 | Payer: Self-pay | Source: Home / Self Care | Attending: Orthopedic Surgery | Admitting: Orthopedic Surgery

## 2010-08-26 ENCOUNTER — Ambulatory Visit
Admission: RE | Admit: 2010-08-26 | Discharge: 2010-08-26 | Payer: Self-pay | Source: Home / Self Care | Attending: Cardiology | Admitting: Cardiology

## 2010-08-26 ENCOUNTER — Other Ambulatory Visit: Payer: Self-pay | Admitting: Cardiology

## 2010-08-26 ENCOUNTER — Other Ambulatory Visit: Payer: Self-pay | Admitting: Family Medicine

## 2010-08-26 ENCOUNTER — Ambulatory Visit: Admission: RE | Admit: 2010-08-26 | Discharge: 2010-08-26 | Payer: Self-pay | Source: Home / Self Care

## 2010-08-26 ENCOUNTER — Ambulatory Visit
Admission: RE | Admit: 2010-08-26 | Discharge: 2010-08-26 | Payer: Self-pay | Source: Home / Self Care | Attending: Family Medicine | Admitting: Family Medicine

## 2010-08-26 DIAGNOSIS — G4730 Sleep apnea, unspecified: Secondary | ICD-10-CM | POA: Insufficient documentation

## 2010-08-26 LAB — BASIC METABOLIC PANEL
BUN: 36 mg/dL — ABNORMAL HIGH (ref 6–23)
CO2: 27 mEq/L (ref 19–32)
Calcium: 9.6 mg/dL (ref 8.4–10.5)
Chloride: 104 mEq/L (ref 96–112)
Creatinine, Ser: 1.6 mg/dL — ABNORMAL HIGH (ref 0.4–1.5)
GFR: 43.75 mL/min — ABNORMAL LOW (ref >60.00)
Glucose, Bld: 122 mg/dL — ABNORMAL HIGH (ref 70–99)
Potassium: 4.4 mEq/L (ref 3.5–5.1)
Sodium: 139 mEq/L (ref 135–145)

## 2010-08-26 LAB — HEMOGLOBIN A1C: Hgb A1c MFr Bld: 6.2 % (ref 4.6–6.5)

## 2010-08-27 ENCOUNTER — Ambulatory Visit
Admission: RE | Admit: 2010-08-27 | Discharge: 2010-08-27 | Payer: Self-pay | Source: Home / Self Care | Attending: Orthopedic Surgery | Admitting: Orthopedic Surgery

## 2010-08-27 LAB — BASIC METABOLIC PANEL
BUN: 34 mg/dL — ABNORMAL HIGH (ref 6–23)
CO2: 28 mEq/L (ref 19–32)
Calcium: 9.9 mg/dL (ref 8.4–10.5)
Chloride: 104 mEq/L (ref 96–112)
Creatinine, Ser: 1.63 mg/dL — ABNORMAL HIGH (ref 0.4–1.5)
GFR calc Af Amer: 50 mL/min — ABNORMAL LOW (ref >60)
GFR calc non Af Amer: 41 mL/min — ABNORMAL LOW (ref >60)
Glucose, Bld: 113 mg/dL — ABNORMAL HIGH (ref 70–99)
Potassium: 5 mEq/L (ref 3.5–5.1)
Sodium: 141 mEq/L (ref 135–145)

## 2010-08-27 LAB — PROTIME-INR
INR: 1.31 (ref 0.00–1.49)
Prothrombin Time: 16.5 seconds — ABNORMAL HIGH (ref 11.6–15.2)

## 2010-08-27 LAB — APTT: aPTT: 27 seconds (ref 24–37)

## 2010-09-01 LAB — GLUCOSE, CAPILLARY
Glucose-Capillary: 100 mg/dL — ABNORMAL HIGH (ref 70–99)
Glucose-Capillary: 84 mg/dL (ref 70–99)

## 2010-09-04 ENCOUNTER — Ambulatory Visit: Admission: RE | Admit: 2010-09-04 | Discharge: 2010-09-04 | Payer: Self-pay | Source: Home / Self Care

## 2010-09-07 LAB — CONVERTED CEMR LAB
Albumin: 3.8 g/dL (ref 3.5–5.2)
Alkaline Phosphatase: 27 units/L — ABNORMAL LOW (ref 39–117)
CO2: 28 meq/L (ref 19–32)
Chloride: 106 meq/L (ref 96–112)
Creatinine, Ser: 1.3 mg/dL (ref 0.4–1.5)
Sodium: 139 meq/L (ref 135–145)
TSH: 2.5 microintl units/mL (ref 0.35–5.50)
Total Protein: 6.7 g/dL (ref 6.0–8.3)

## 2010-09-09 NOTE — Progress Notes (Signed)
   Phone Note Outgoing Call   Summary of Call: Message left for pt. to call back as we have rec'd instructions re:  Coumadin prior to colon.from Dr.Wall. Initial call taken by: Teryl Lucy RN,  November 05, 2009 12:03 PM     Appended Document:  pt aware of Dr Vern Claude instructions regarding coumadin

## 2010-09-09 NOTE — Medication Information (Signed)
  Anticoagulant Therapy  Managed by: Shelby Dubin, PharmD, BCPS, CPP Referring MD: Valera Castle MD PCP: Laurita Quint MD Supervising MD: Myrtis Ser MD, Tinnie Gens Indication 1: Atrial Flutter (ICD-427.32) Lab Used: [facility] Site: [facility] INR POC 1.7 INR RANGE 2 - 3  Dietary changes: no    Health status changes: no    Bleeding/hemorrhagic complications: no    Recent/future hospitalizations: no    Any changes in medication regimen? no    Recent/future dental: no  Any missed doses?: no       Is patient compliant with meds? yes       Current Medications (verified): 1)  Metoprolol Tartrate 100 Mg Tabs (Metoprolol Tartrate) .... 1/2 Tab Qam..1 Whole Tab At Bedtime 2)  Lovastatin 40 Mg  Tabs (Lovastatin) .... 2 Tabs By Mouth At Bedtime 3)  Lisinopril 5 Mg Tabs (Lisinopril) .... Take One Tablet By Mouth in The Morning and 1/2 Tablet in The Evening. 4)  Aspir-Low 81 Mg Tbec (Aspirin) .[name].. 1  Daily By Mouth 5)  Fish Oil 1000 Mg Caps (Omega-3 Fatty Acids) .[name].. 1tablet Twice A Day By Mouth 6)  Daily Multiple Vitamins  Tabs (Multiple Vitamin) .[name].. 1 Tablet Daily By Mouth 7)  Metformin Hcl 500 Mg  Tabs (Metformin Hcl) .... One Tab By Mouth Two Times A Day 8)  Tylenol Extra Strength 500 Mg  Tabs (Acetaminophen) .... 2 Tablets By Mouth As Needed Pain 9)  Hydrochlorothiazide 12.5 Mg  Tabs (Hydrochlorothiazide) .[name].. 1 Daily By Mouth 10)  Fenofibrate 160 Mg Tabs (Fenofibrate) .... One Tab By Mouth Once Daily 11)  Coumadin 5 Mg Tabs (Warfarin Sodium) .... Take As Directed By Coumadin Clinic.  Allergies (verified): 1)  ! * Altace 2)  ! Imdur 3)  ! * Quinidine Gluconate 4)  ! * Reopro  Anticoagulation Management History:      The patient is taking warfarin and comes in today for a routine follow up visit.  Positive risk factors for bleeding include an age of 3 years or older and presence of serious comorbidities.  The bleeding index is 'intermediate risk'.  Positive CHADS2 values include  History of HTN, Age > 70 years old, and History of Diabetes.  The start date was 04/21/1999.  Anticoagulation responsible provider: Myrtis Ser MD, Tinnie Gens.  INR POC: 1.7.  Cuvette Lot#: [id number].  Exp: 11/2010.    Anticoagulation Management Assessment/Plan:      The patient's current anticoagulation dose is Coumadin 5 mg tabs: Take as directed by coumadin clinic..  The target INR is 2.0-3.0.  The next INR is due 09/30/2009.  Anticoagulation instructions were given to patient.  Results were reviewed/authorized by Shelby Dubin, PharmD, BCPS, CPP.  He was notified by Shelby Dubin PharmD, BCPS, CPP.         Prior Anticoagulation Instructions: INR 2.3  Continue on same dosage 2.5mg  daily.  Recheck in 4 weeks.    Current Anticoagulation Instructions: INR 1.7  Take 1 tab each Monday and 0.5 tab on all other days.    Recheck in 3 - 4 weeks.

## 2010-09-09 NOTE — Miscellaneous (Signed)
Summary: hand update  Per recommendations by Dr. Patsy Lager, will set up with ortho evaluation of scaphoid fracture.  Called [facility] and notified him of this, advised to wait for call from [place] later today to set up ortho early next week. Eric Boyden  MD  May 30, 2010 11:18 AM  Appt made with Hand Surgeon Dr Cindee Salt on  Monday 06/02/2010 at 10:30. Patient has been notified. Eric [name]  May 30, 2010 12:33 PM  Clinical Lists Changes  Orders: Added new Referral order of Orthopedic Referral (Ortho) - Signed

## 2010-09-09 NOTE — Letter (Signed)
Summary: [facility] Instructions  [facility]  [address] [place], [place] [address]   Phone: [phone]  Fax: [phone]       Eric [name]    1927/09/30    MRN: [id number]        Procedure Day /Date:12/04/09     Arrival Time:730 am     Procedure Time:8:30 am     Location of Procedure:                     X  [facility] ( Outpatient Registration)                        PREPARATION FOR COLONOSCOPY WITH MOVIPREP   Starting 5 days prior to your procedure 11/29/09 do not eat nuts, seeds, popcorn, corn, beans, peas,  salads, or any raw vegetables.  Do not take any fiber supplements (e.g. Metamucil, Citrucel, and Benefiber).  THE DAY BEFORE YOUR PROCEDURE         DATE: 12/03/09  DAY: TUE  1.  Drink clear liquids the entire day-NO SOLID FOOD  2.  Do not drink anything colored red or purple.  Avoid juices with pulp.  No orange juice.  3.  Drink at least 64 oz. (8 glasses) of fluid/clear liquids during the day to prevent dehydration and help the prep work efficiently.  CLEAR LIQUIDS INCLUDE: Water Jello Ice Popsicles Tea (sugar ok, no milk/cream) Powdered fruit flavored drinks Coffee (sugar ok, no milk/cream) Gatorade Juice: apple, white grape, white cranberry  Lemonade Clear bullion, consomm, broth Carbonated beverages (any kind) Strained chicken noodle soup Hard Candy                             4.  In the morning, mix first dose of MoviPrep solution:    Empty 1 Pouch A and 1 Pouch B into the disposable container    Add lukewarm drinking water to the top line of the container. Mix to dissolve    Refrigerate (mixed solution should be used within 24 hrs)  5.  Begin drinking the prep at 5:00 p.m. The MoviPrep container is divided by 4 marks.   Every 15 minutes drink the solution down to the next mark (approximately 8 oz) until the full liter is complete.   6.  Follow completed prep with 16 oz of clear liquid of your choice (Nothing red or  purple).  Continue to drink clear liquids until bedtime.  7.  Before going to bed, mix second dose of MoviPrep solution:    Empty 1 Pouch A and 1 Pouch B into the disposable container    Add lukewarm drinking water to the top line of the container. Mix to dissolve    Refrigerate  THE DAY OF YOUR PROCEDURE      DATE: 12/04/09 DAY: WED  Beginning at 330 a.m. (5 hours before procedure):         1. Every 15 minutes, drink the solution down to the next mark (approx 8 oz) until the full liter is complete.  2. Follow completed prep with 16 oz. of clear liquid of your choice.    3. You may drink clear liquids until 430 am (4HOURS BEFORE PROCEDURE).   MEDICATION INSTRUCTIONS  Unless otherwise instructed, you should take regular prescription medications with a small sip of water   as early as possible the morning of your procedure.  Diabetic  patients - see separate instructions.    Stop taking Coumadin on  11/29/09  (5 days before procedure).  You will be contaced by our office prior to your procedure for directions on holding your Coumadin/Warfarin.  If you do not hear from our office 1 week prior to your scheduled procedure, please call [phone] to discuss.           OTHER INSTRUCTIONS  You will need a responsible adult at least 75 years of age to accompany you and drive you home.   This person must remain in the waiting room during your procedure.  Wear loose fitting clothing that is easily removed.  Leave jewelry and other valuables at home.  However, you may wish to bring a book to read or  an iPod/MP3 player to listen to music as you wait for your procedure to start.  Remove all body piercing jewelry and leave at home.  Total time from sign-in until discharge is approximately 2-3 hours.  You should go home directly after your procedure and rest.  You can resume normal activities the  day after your procedure.  The day of your procedure you should not:   Drive    Make legal decisions   Operate machinery   Drink alcohol   Return to work  You will receive specific instructions about eating, activities and medications before you leave.    The above instructions have been reviewed and explained to me by   _______________________    I fully understand and can verbalize these instructions _____________________________ Date _________  Appended Document: Moviprep Instructions date changed to 12/18/09  Location [facility]

## 2010-09-09 NOTE — Procedures (Signed)
Summary: Colonoscopy  Patient: Lisa Blakeman Note: All result statuses are Final unless otherwise noted.  Tests: (1) Colonoscopy (COL)   COL Colonoscopy           DONE     [facility]     [address]     [place], [place]  [address]           COLONOSCOPY PROCEDURE REPORT           PATIENT:  Li, Fragoso  MR#:  [id number]     BIRTHDATE:  December 05, 1927, 81 yrs. old  GENDER:  male     ENDOSCOPIST:  Rachael Fee, MD     PROCEDURE DATE:  01/10/2010     PROCEDURE:  Colonoscopy with snare polypectomy     ASA CLASS:  Class II     INDICATIONS:  history of pre-cancerous (adenomatous) colon polyps           MEDICATIONS:   Fentanyl 25 mcg IV, Versed 3 mg IV           DESCRIPTION OF PROCEDURE:   After the risks benefits and     alternatives of the procedure were thoroughly explained, informed     consent was obtained.  Digital rectal exam was performed and     revealed no rectal masses.   The LB [id number] endoscope     was introduced through the anus and advanced to the cecum, which     was identified by both the appendix and ileocecal valve, without     limitations.  The quality of the prep was good, using MoviPrep.     The instrument was then slowly withdrawn as the colon was fully     examined.     <<PROCEDUREIMAGES>>           FINDINGS:  Moderate diverticulosis was found throughout the colon     (see image3).  There were multiple polyps identified and removed.     A total of four sessile polpys were found. All were removed with     cold snare and three were retrieved, sent to pathology in jar #.     These were 3-15mm across, located in transverse and sigmoid     segments(see image4, image5, and image6).  This was otherwise a     normal examination of the colon (see image2, image1, and image7).     Retroflexed views in the rectum revealed no abnormalities.    The     scope was then withdrawn from the patient and the procedure     completed.        COMPLICATIONS:  None     ENDOSCOPIC IMPRESSION:     1) Moderate diverticulosis throughout the colon     2) Four small polyps, all removed; three were retrieved and sent     to pathology     3) Otherwise normal examination           RECOMMENDATIONS:     1) You will receive a letter within 1-2 weeks with the results     of your biopsy as well as final recommendations. Please call my     office if you have not received a letter after 3 weeks.  Given     age, probably will not need further polyp surveillance     examinations.     2) Since your loose stools have improved with fiber supplements,     you should continue taking the supplements daily.  ______________________________     Rachael Fee, MD           n.     eSIGNED:   Rachael Fee at 01/10/2010 10:59 AM           Tamera Stands, [id number]  Note: An exclamation mark (!) indicates a result that was not dispersed into the flowsheet. Document Creation Date: 01/10/2010 11:00 AM _______________________________________________________________________  (1) Order result status: Final Collection or observation date-time: 01/10/2010 10:53 Requested date-time:  Receipt date-time:  Reported date-time:  Referring Physician:   Ordering Physician: Rob Bunting [phone]) Specimen Source:  Source: Launa Grill Order Number: [id number] Lab site:

## 2010-09-09 NOTE — Assessment & Plan Note (Signed)
Summary: ONE MTH F/U BP CHECK / LFW   Vital Signs:  Patient profile:   77 year old male Weight:      179.75 pounds Temp:     98.5 degrees F oral Pulse rate:   72 / minute Pulse rhythm:   irregular BP sitting:   140 / 84  (left arm) Cuff size:   large  Vitals Entered By: Sydell Axon LPN (January 29, 2010 8:41 AM) CC: One month follow-up on BP, BP seems to be up first thing in the morning and gradually comes down   History of Present Illness: Pt here for one month BP recheck. He has tolerated his Metoprolol increase in the AM well and finds his morning BP coming down quicker than previously. He finds his BP in the AM always higher. It then normalizes late morning but is doingt so earlier now with the increased Metoprolol dose. He feels well otherwise and has no other complaints.  Problems Prior to Update: 1)  Diarrhea  (ICD-787.91) 2)  Personal History of Colonic Polyps  (ICD-V12.72) 3)  Vitamin B12 Deficiency  (ICD-266.2) 4)  Coronary Artery Disease, S/p Cabg 2000  (ICD-414.00) 5)  Atrial Fibrillation  (ICD-427.31) 6)  Atrial Flutter  (ICD-427.32) 7)  Coumadin Therapy, Sec. A-fib  (ICD-V58.61) 8)  Hypertriglyceridemia  (ICD-272.1) 9)  Hypertension  (ICD-401.9) 10)  Diabetes Mellitus, Type II  (ICD-250.00) 11)  Special Screening Malignant Neoplasm of Prostate  (ICD-V76.44)  Medications Prior to Update: 1)  Metoprolol Tartrate 100 Mg Tabs (Metoprolol Tartrate) .[name].. 1 Tab By Mouth Two Times A Day 2)  Lisinopril 5 Mg Tabs (Lisinopril) .... Take One Tablet By Mouth in The Morning and 1/2 Tablet in The Evening. 3)  Aspir-Low 81 Mg Tbec (Aspirin) .[name].. 1  Daily By Mouth 4)  Fish Oil 1000 Mg Caps (Omega-3 Fatty Acids) .[name].. 1tablet Twice A Day By Mouth 5)  Daily Multiple Vitamins  Tabs (Multiple Vitamin) .[name].. 1 Tablet Daily By Mouth 6)  Metformin Hcl 500 Mg  Tabs (Metformin Hcl) .... One Tab By Mouth Two Times A Day 7)  Tylenol Extra Strength 500 Mg  Tabs (Acetaminophen) .... 2 Tablets By  Mouth As Needed Pain 8)  Hydrochlorothiazide 12.5 Mg  Tabs (Hydrochlorothiazide) .[name].. 1 Daily By Mouth 9)  Fenofibrate 160 Mg Tabs (Fenofibrate) .... One Tab By Mouth Once Daily 10)  Coumadin 5 Mg Tabs (Warfarin Sodium) .... Take As Directed By Coumadin Clinic. 11)  Simvastatin 40 Mg Tabs (Simvastatin) .... Take One Tablet By Mouth Daily At Bedtime 12)  B Complex-B12  Tabs (B Complex Vitamins) .[name].. 1000 Micrograms (1 Tablet) Daily. 13)  Vitamin D 1000 Unit Tabs (Cholecalciferol) .... Take Twice A Week 14)  Trimox 500 Mg Caps (Amoxicillin) .... 2 Tabs By Mouth Two Times A Day  Allergies: 1)  ! * Altace 2)  ! Imdur 3)  ! * Quinidine Gluconate 4)  ! * Reopro  Physical Exam  General:  Well-developed,well-nourished,in no acute distress; alert,appropriate and cooperative throughout examination, mildly obese and clear. Head:  Normocephalic and atraumatic without obvious abnormalities. No apparent alopecia but mild  balding. Sinuses NT.  Eyes:  Conjunctiva clear bilaterally. Ears:  External ear exam shows no significant lesions or deformities.  Otoscopic examination reveals clear canals, tympanic membranes are intact bilaterally without bulging, retraction, inflammation or discharge. Hearing is grossly normal bilaterally. Hearing aids in place bilat, remotely controlled. Nose:  External nasal examination shows no deformity or inflammation. Nasal mucosa are pink and moist without lesions  or exudates. Minimal congestion with clear discharge. Mouth:  Oral mucosa and oropharynx without lesions or exudates.  Teeth in good repair.  Neck:  No deformities, masses, or tenderness noted. Chest Wall:  No deformities, masses, tenderness or gynecomastia noted. Lungs:  Normal respiratory effort, chest expands symmetrically. Lungs are clear to auscultation, no crackles or wheezes.  Heart:  Normal rate and regular rhythm. S1 and S2 normal without gallop, murmur, click, rub or other extra sounds. Extremities:  No  clubbing, cyanosis, edema, or deformity noted with normal full range of motion of all joints.    Diabetes Management Exam:    Foot Exam (with socks and/or shoes not present):       Sensory-Pinprick/Light touch:          Left medial foot (L-4): normal          Left dorsal foot (L-5): normal          Left lateral foot (S-1): normal          Right medial foot (L-4): normal          Right dorsal foot (L-5): normal          Right lateral foot (S-1): normal       Sensory-Monofilament:          Left foot: normal          Right foot: normal       Inspection:          Left foot: normal          Right foot: normal       Nails:          Left foot: normal          Right foot: normal   Impression & Recommendations:  Problem # 1:  HYPERTENSION (ICD-401.9) Assessment Improved Continue curr meds. Will follow in the future and adjust as necessary. His updated medication list for this problem includes:    Metoprolol Tartrate 100 Mg Tabs (Metoprolol tartrate) .[name]... 1 tab by mouth two times a day    Lisinopril 5 Mg Tabs (Lisinopril) .[name]... Take one tablet by mouth in the morning and 1/2 tablet in the evening.    Hydrochlorothiazide 12.5 Mg Tabs (Hydrochlorothiazide) .[name]... 1 daily by mouth  BP today: 140/84 Prior BP: 160/100 (12/24/2009)  Labs Reviewed: K+: 5.0 (09/20/2009) Creat: : 1.7 (09/20/2009)   Chol: 159 (12/20/2009)   HDL: 29.50 (12/20/2009)   LDL: 93 (12/20/2009)   TG: 185.0 (12/20/2009)  Complete Medication List: 1)  Metoprolol Tartrate 100 Mg Tabs (Metoprolol tartrate) .[name].. 1 tab by mouth two times a day 2)  Lisinopril 5 Mg Tabs (Lisinopril) .... Take one tablet by mouth in the morning and 1/2 tablet in the evening. 3)  Aspir-low 81 Mg Tbec (Aspirin) .[name].. 1  daily by mouth 4)  Fish Oil 1000 Mg Caps (Omega-3 fatty acids) .[name].. 1tablet twice a day by mouth 5)  Daily Multiple Vitamins Tabs (Multiple vitamin) .[name].. 1 tablet daily by mouth 6)  Metformin Hcl 500 Mg Tabs (Metformin hcl) ....  One tab by mouth two times a day 7)  Tylenol Extra Strength 500 Mg Tabs (Acetaminophen) .... 2 tablets by mouth as needed pain 8)  Hydrochlorothiazide 12.5 Mg Tabs (Hydrochlorothiazide) .[name].. 1 daily by mouth 9)  Fenofibrate 160 Mg Tabs (Fenofibrate) .... One tab by mouth once daily 10)  Coumadin 5 Mg Tabs (Warfarin sodium) .... Take as directed by coumadin clinic. 11)  Simvastatin 40 Mg Tabs (Simvastatin) .Marland KitchenMarland KitchenMarland Kitchen  Take one tablet by mouth daily at bedtime 12)  B Complex-b12 Tabs (B complex vitamins) .[name].. 1000 micrograms (1 tablet) daily. 13)  Vitamin D 1000 Unit Tabs (Cholecalciferol) .... Take twice a week  Patient Instructions: 1)  RTC 6 mos, sooner as needed.  Current Allergies (reviewed today): ! * ALTACE ! IMDUR ! * QUINIDINE GLUCONATE ! * REOPRO

## 2010-09-09 NOTE — Letter (Signed)
Summary: CMN for [employer]/[facility]  CMN for [employer]/[facility]   Imported By: Lanelle Bal 09/28/2009 11:19:53  _____________________________________________________________________  External Attachment:    Type:   Image     Comment:   External Document

## 2010-09-09 NOTE — Medication Information (Signed)
Summary: rov/tm  Anticoagulant Therapy  Managed by: Elaina Pattee, PharmD Referring MD: Valera Castle, MD PCP: Laurita Quint MD Supervising MD: Myrtis Ser MD, Tinnie Gens Indication 1: Atrial Flutter (ICD-427.32) Lab Used: [facility] Site: [facility] INR POC 1.6 INR RANGE 2 - 3  Dietary changes: no    Health status changes: yes       Details: Several polyps removed during colonoscopy.  No report from biopsy yet. No bleeding noted.  Bleeding/hemorrhagic complications: no    Recent/future hospitalizations: no    Any changes in medication regimen? no    Recent/future dental: no  Any missed doses?: no       Is patient compliant with meds? yes       Allergies: 1)  ! * Altace 2)  ! Imdur 3)  ! * Quinidine Gluconate 4)  ! * Reopro  Anticoagulation Management History:      The patient is taking warfarin and comes in today for a routine follow up visit.  Positive risk factors for bleeding include an age of 75 years or older and presence of serious comorbidities.  The bleeding index is 'intermediate risk'.  Positive CHADS2 values include History of HTN, Age > 10 years old, and History of Diabetes.  The start date was 04/21/1999.  Anticoagulation responsible provider: Myrtis Ser MD, Tinnie Gens.  INR POC: 1.6.  Cuvette Lot#: [id number].  Exp: 01/2011.    Anticoagulation Management Assessment/Plan:      The patient's current anticoagulation dose is Coumadin 5 mg tabs: Take as directed by coumadin clinic..  The target INR is 2.0-3.0.  The next INR is due 01/27/2010.  Anticoagulation instructions were given to patient.  Results were reviewed/authorized by Elaina Pattee, PharmD.  He was notified by Elaina Pattee, PharmD.         Prior Anticoagulation Instructions: INR 2.6 Continue 1/2 piil everyday. Take last dose on 01/04/10. Colonoscopy has been rescheduled to 01/10/10. You have to be there at 930am.   Current Anticoagulation Instructions: INR 1.6. Take 1 tablet today, then continue 0.5 tablet daily.  Recheck in 10 days.

## 2010-09-09 NOTE — Procedures (Signed)
Summary: Colon   Colonoscopy  Procedure date:  10/01/2006  Findings:      Location:  [facility].   Patient Name: Eric [name], Eric [name]. MRN:  Procedure Procedures: Colonoscopy CPT: [phone].    with polypectomy. CPT: A3573898.  Personnel: Endoscopist: Rachael Fee, MD.  Referred By: Laurita Quint, MD.  Exam Location: Exam performed in Endoscopy Suite. Outpatient  Patient Consent: Procedure, Alternatives, Risks and Benefits discussed, consent obtained, from patient. Consent was obtained by the RN.  Indications  Increased Risk Screening: Family History of Polyps.  History  Current Medications: Patient is currently taking Coumadin.  Comments: Patient history reviewed and updated, pre-procedure physical performed prior to initiation of sedation? yes Pre-Exam Physical: Performed Oct 01, 2006. Cardio-pulmonary exam, Abdominal exam, Mental status exam WNL.  Exam Exam: Extent of exam reached: Cecum, extent intended: Cecum.  The cecum was identified by appendiceal orifice and IC valve. Patient position: on left side. Time to Cecum: 00:01:53. Time for Withdrawl: 00:12:08. Colon retroflexion performed. Images taken. ASA Classification: II. Tolerance: good.  Monitoring: Pulse and BP monitoring, Oximetry used. Supplemental O2 given.  Colon Prep Prep results: good.  Sedation Meds: Patient assessed and found to be appropriate for moderate (conscious) sedation. Fentanyl 50 mcg. given IV. Versed 5 mg. given IV.  Findings POLYP: Descending Colon, Maximum size: 3 mm. sessile polyp. Procedure:  snare without cautery, removed, retrieved, Polyp sent to pathology. Polyp sent to pathology.  POLYP: Ascending Colon, Maximum size: 2 mm. sessile polyp. Procedure:  snare without cautery, removed, retrieved, sent to pathology. sent to pathology.  - DIVERTICULOSIS: Cecum to Sigmoid Colon. Comments: multiple large diverticulum throughout entire colon.  POLYP: Sigmoid Colon,  Maximum size: 4 mm. sessile polyp. Procedure:  snare without cautery, removed, retrieved, sent to pathology. sent to pathology.  - NORMAL EXAM: Cecum to Rectum. Comments: otherwise normal examination.   Assessment Abnormal examination, see findings above.  Comments: Three small colon polyps, no cancers. Events  Unplanned Interventions: No intervention was required.  Unplanned Events: There were no complications. Plans Comments: If the polyps are tubular adenomas, will need a surveillance colonoscopy in 3 years.  Scheduling/Referral: Await pathology to schedule patient.  This report was created from the original endoscopy report, which was reviewed and signed by the above listed endoscopist.

## 2010-09-09 NOTE — Procedures (Signed)
Summary: Request to hold Coumadin prior to procedure/[facility]  Request to hold Coumadin prior to procedure/[facility]   Imported By: Sherian Rein 11/12/2009 08:21:02  _____________________________________________________________________  External Attachment:    Type:   Image     Comment:   External Document

## 2010-09-09 NOTE — Letter (Signed)
Summary: Diabetic Instructions  [facility]  [address] [place], [place] [address]   Phone: [phone]  Fax: [phone]    Eric [name] 1928-04-23 MRN: [id number]   X   ORAL DIABETIC MEDICATION INSTRUCTIONS  The day before your procedure:   Take your diabetic pill as you do normally  The day of your procedure:   Do not take your diabetic pill    We will check your blood sugar levels during the admission process and again in Recovery before discharging you home  ________________________________________________________________________

## 2010-09-09 NOTE — Assessment & Plan Note (Signed)
Summary: INJURED HAND   Vital Signs:  Patient profile:   77 year old male Weight:      178.75 pounds Temp:     97.4 degrees F oral Pulse rate:   66 / minute Pulse rhythm:   regular BP sitting:   160 / 100  (left arm) Cuff size:   large  Vitals Entered By: Selena Batten Dance CMA Duncan Dull) (May 28, 2010 9:11 AM) CC: Check Right Hand Comments Patient tripped and fell approx. 2 weeks ago and has had right hand pain and swelling since   History of Present Illness: CC: right hand injury  tripped over gas hose 2 wks ago today, fell to ground, hit right wrist and right forehead (05/14/2010).  Started hurting right away dorsal hand and wrist, ace wrapped, took a few tylenols, putting up with pain.  + swelling.  No previous injuries to hand/arm.  No numbness.  + weakness 2/2 pain.  Hit head as well, abrasion to R lateral to eye and had black eye, now resolved.  On coumadin too.  last week INR was 2.7.  Current Medications (verified): 1)  Metoprolol Tartrate 100 Mg Tabs (Metoprolol Tartrate) .[name].. 1 Tab By Mouth Two Times A Day 2)  Lisinopril 5 Mg Tabs (Lisinopril) .... Take One Tablet By Mouth in The Morning and 1 Tablet At Bedtime 3)  Aspir-Low 81 Mg Tbec (Aspirin) .[name].. 1  Daily By Mouth 4)  Fish Oil 1000 Mg Caps (Omega-3 Fatty Acids) .[name].. 1tablet Twice A Day By Mouth 5)  Daily Multiple Vitamins  Tabs (Multiple Vitamin) .[name].. 1 Tablet Daily By Mouth 6)  Metformin Hcl 500 Mg  Tabs (Metformin Hcl) .... One Tab By Mouth Two Times A Day 7)  Tylenol Extra Strength 500 Mg  Tabs (Acetaminophen) .... 2 Tablets By Mouth As Needed Pain 8)  Hydrochlorothiazide 12.5 Mg  Tabs (Hydrochlorothiazide) .[name].. 1 Daily By Mouth 9)  Fenofibrate 160 Mg Tabs (Fenofibrate) .... One Tab By Mouth Once Daily 10)  Coumadin 5 Mg Tabs (Warfarin Sodium) .... Take As Directed By Coumadin Clinic. 11)  Simvastatin 40 Mg Tabs (Simvastatin) .... Take One Tablet By Mouth Daily At Bedtime 12)  B Complex-B12  Tabs (B Complex Vitamins)  .[name].. 1000 Micrograms (1 Tablet) Daily. 13)  Vitamin D 1000 Unit Tabs (Cholecalciferol) .... Take Twice A Week  Allergies: 1)  ! * Altace 2)  ! Imdur 3)  ! * Quinidine Gluconate 4)  ! * Reopro  Past History:  Past Medical History: Last updated: 12/25/2008 CORONARY ARTERY DISEASE, S/P CABG 2000 (ICD-414.00) ATRIAL FIBRILLATION (ICD-427.31) ATRIAL FLUTTER (ICD-427.32) COUMADIN THERAPY, SEC. A-FIB (ICD-V58.61) HYPERTRIGLYCERIDEMIA (ICD-272.1) HYPERTENSION (ICD-401.9) DIABETES MELLITUS, TYPE II (ICD-250.00) BRONCHITIS- ACUTE (ICD-466.0) SPECIAL SCREENING MALIGNANT NEOPLASM OF PROSTATE (ICD-V76.44)    Social History: Last updated: 09/18/2008 Occupation: [employer] Retired   Now does antiques Married 5 Chidren Former Smoker Alcohol use-no Drug use-no  Review of Systems       per HPI  Physical Exam  General:  Well-developed,well-nourished,in no acute distress; alert,appropriate and cooperative throughout examination, mildly obese and clear. Head:  healed abrasion right lateral eyebrow.  resolving ecchymosis R medial superior eyelid Eyes:  Conjunctiva clear bilaterally. Msk:  L hand - FROM, no tenderness to palpation  R hand - no deformity, slight swelling, no warmth or ecchymosis.  decreased ROM at wrist 2/2 pain.  + tender to palpation 3rd and 4th MC shafts.  + slight tenderness anatomical snuff box, max tenderness at wrist flexion. Pulses:  2+  rad pulses Extremities:  no edema Neurologic:  sensation intact   Impression & Recommendations:  Problem # 1:  HAND PAIN, RIGHT (ICD-729.5) Xrays - scaphoid fracture, nondisplaced.  treat with immobilization x 2 wks and f/u with Dr. Patsy Lager.  Return if not improving as expected.  Possible wrist sprain as well.  Treat with thumb spica brace for 2 more wks, tylenol scheduled, ice.  Orders: T-Wrist Comp Right (73110TC)  Complete Medication List: 1)  Metoprolol Tartrate 100 Mg Tabs (Metoprolol tartrate) .[name].. 1 tab by mouth  two times a day 2)  Lisinopril 5 Mg Tabs (Lisinopril) .... Take one tablet by mouth in the morning and 1 tablet at bedtime 3)  Aspir-low 81 Mg Tbec (Aspirin) .[name].. 1  daily by mouth 4)  Fish Oil 1000 Mg Caps (Omega-3 fatty acids) .[name].. 1tablet twice a day by mouth 5)  Daily Multiple Vitamins Tabs (Multiple vitamin) .[name].. 1 tablet daily by mouth 6)  Metformin Hcl 500 Mg Tabs (Metformin hcl) .... One tab by mouth two times a day 7)  Tylenol Extra Strength 500 Mg Tabs (Acetaminophen) .... 2 tablets by mouth as needed pain 8)  Hydrochlorothiazide 12.5 Mg Tabs (Hydrochlorothiazide) .[name].. 1 daily by mouth 9)  Fenofibrate 160 Mg Tabs (Fenofibrate) .... One tab by mouth once daily 10)  Coumadin 5 Mg Tabs (Warfarin sodium) .... Take as directed by coumadin clinic. 11)  Simvastatin 40 Mg Tabs (Simvastatin) .... Take one tablet by mouth daily at bedtime 12)  B Complex-b12 Tabs (B complex vitamins) .[name].. 1000 micrograms (1 tablet) daily. 13)  Vitamin D 1000 Unit Tabs (Cholecalciferol) .... Take twice a week  Patient Instructions: 1)  Xray with scaphoid fracture.  Use brace daily and nightly, may remove for icing. 2)  Return to see Dr. Patsy Lager in 2 weeks for follow up, sooner if not improving as expected or any concerns. 3)  tylenol scheduled 1000mg  3 times a day.   Orders Added: 1)  T-Wrist Comp Right [73110TC] 2)  Est. Patient Level III [[address]]    Current Allergies (reviewed today): ! * ALTACE ! IMDUR ! * QUINIDINE GLUCONATE ! * REOPRO  Appended Document: INJURED HAND     Clinical Lists Changes  Orders: Added new Service order of Flu Vaccine 96yrs + [phone]) - Signed Added new Service order of Admin 1st Vaccine (09811) - Signed Observations: Added new observation of FLU VAX#1VIS: 03/04/10 version given May 28, 2010. (05/28/2010 12:23) Added new observation of FLU VAXLOT: [id number] (05/28/2010 12:23) Added new observation of FLU VAX EXP: 02/07/2011 (05/28/2010 12:23) Added new  observation of FLU VAXBY: Kim Dance CMA (AAMA) (05/28/2010 12:23) Added new observation of FLU VAXRTE: IM (05/28/2010 12:23) Added new observation of FLU VAX DSE: 0.5 ml (05/28/2010 12:23) Added new observation of FLU VAXMFR: GlaxoSmithKline (05/28/2010 12:23) Added new observation of FLU VAX SITE: left deltoid (05/28/2010 12:23) Added new observation of FLU VAX: Fluvax 3+ (05/28/2010 12:23)       Immunizations Administered:  Influenza Vaccine # 1:    Vaccine Type: Fluvax 3+    Site: left deltoid    Mfr: GlaxoSmithKline    Dose: 0.5 ml    Route: IM    Given by: Selena Batten Dance CMA (AAMA)    Exp. Date: 02/07/2011    Lot #: [id number]    VIS given: 03/04/10 version given May 28, 2010.  Flu Vaccine Consent Questions:    Do you have a history of severe allergic reactions to this vaccine? no    Any prior  history of allergic reactions to egg and/or gelatin? no    Do you have a sensitivity to the preservative Thimersol? no    Do you have a past history of Guillan-Barre Syndrome? no    Do you currently have an acute febrile illness? no    Have you ever had a severe reaction to latex? no    Vaccine information given and explained to patient? yes

## 2010-09-09 NOTE — Medication Information (Signed)
Summary: rov/sp   Anticoagulant Therapy  Managed by: Weston Brass, PharmD Referring MD: Valera Castle, MD PCP: Laurita Quint MD Supervising MD: Daleen Squibb MD, Maisie Fus Indication 1: Atrial Flutter (ICD-427.32) Lab Used: [facility] Site: [facility] INR POC 2.7 INR RANGE 2 - 3  Dietary changes: no    Health status changes: no    Bleeding/hemorrhagic complications: no    Recent/future hospitalizations: no    Any changes in medication regimen? no    Recent/future dental: no  Any missed doses?: no       Is patient compliant with meds? yes       Allergies: 1)  ! * Altace 2)  ! Imdur 3)  ! * Quinidine Gluconate 4)  ! * Reopro  Anticoagulation Management History:      The patient is taking warfarin and comes in today for a routine follow up visit.  Positive risk factors for bleeding include an age of 75 years or older and presence of serious comorbidities.  The bleeding index is 'intermediate risk'.  Positive CHADS2 values include History of HTN, Age > 25 years old, and History of Diabetes.  The start date was 04/21/1999.  Anticoagulation responsible Maveryck Bahri: Daleen Squibb MD, Maisie Fus.  INR POC: 2.7.  Cuvette Lot#: [id number].  Exp: 06/2011.    Anticoagulation Management Assessment/Plan:      The patient's current anticoagulation dose is Coumadin 5 mg tabs: Take as directed by coumadin clinic..  The target INR is 2.0-3.0.  The next INR is due 06/04/2010.  Anticoagulation instructions were given to patient.  Results were reviewed/authorized by Weston Brass, PharmD.  He was notified by Haynes Hoehn, PharmD Candidate.         Prior Anticoagulation Instructions: INR 1.2  Take an extra 1 tablet today, 1 tablet tomorrow then resume 1/2 tablet every day.  Recheck INR in 1 week.   Current Anticoagulation Instructions: INR 2.7  Continue Coumadin as scheduled:  1/2 tablet every day of the week.   Return in 2 weeks.

## 2010-09-09 NOTE — Medication Information (Signed)
Summary: rov/eac  Anticoagulant Therapy  Managed by: Cloyde Reams, RN, BSN Referring MD: Valera Castle MD PCP: Laurita Quint MD Supervising MD: Myrtis Ser MD, Tinnie Gens Indication 1: Atrial Flutter (ICD-427.32) Lab Used: [facility] Site: [facility] INR POC 2.7 INR RANGE 2 - 3  Dietary changes: no    Health status changes: no    Bleeding/hemorrhagic complications: no    Recent/future hospitalizations: no    Any changes in medication regimen? no    Recent/future dental: no  Any missed doses?: no       Is patient compliant with meds? yes       Allergies: 1)  ! * Altace 2)  ! Imdur 3)  ! * Quinidine Gluconate 4)  ! * Reopro  Anticoagulation Management History:      The patient is taking warfarin and comes in today for a routine follow up visit.  Positive risk factors for bleeding include an age of 65 years or older and presence of serious comorbidities.  The bleeding index is 'intermediate risk'.  Positive CHADS2 values include History of HTN, Age > 33 years old, and History of Diabetes.  The start date was 04/21/1999.  Anticoagulation responsible provider: Myrtis Ser MD, Tinnie Gens.  INR POC: 2.7.  Cuvette Lot#: [id number].  Exp: 12/2010.    Anticoagulation Management Assessment/Plan:      The patient's current anticoagulation dose is Coumadin 5 mg tabs: Take as directed by coumadin clinic..  The target INR is 2.0-3.0.  The next INR is due 11/18/2009.  Anticoagulation instructions were given to patient.  Results were reviewed/authorized by Cloyde Reams, RN, BSN.  He was notified by Cloyde Reams RN.         Prior Anticoagulation Instructions: INR 3.4  Skip tomorrow's dose.  Then on Wednesday, return to original dosing schedule of 1/2 tablet daily.  Return to clinic in 3 weeks.   Current Anticoagulation Instructions: INR 2.7  Continue on same dosage 1/2 tablet daily except 1 tbalet on Mondays.  Recheck in 4 weeks.

## 2010-09-09 NOTE — Assessment & Plan Note (Signed)
Summary: b12 dlo   Nurse Visit   Allergies: 1)  ! * Altace 2)  ! Imdur 3)  ! * Quinidine Gluconate 4)  ! * Reopro  Medication Administration  Injection # 1:    Medication: Vit B12 1000 mcg    Diagnosis: VITAMIN B12 DEFICIENCY (ICD-266.2)    Route: IM    Site: L deltoid    Exp Date: 12/09/2010    Lot #: [id number]    Mfr: [employer] Regent    Patient tolerated injection without complications    Given by: Mervin Hack CMA Duncan Dull) (August 22, 2009 10:17 AM)  Orders Added: 1)  Vit B12 1000 mcg [J3420] 2)  Admin of Therapeutic Inj  intramuscular or subcutaneous [96372]   Medication Administration  Injection # 1:    Medication: Vit B12 1000 mcg    Diagnosis: VITAMIN B12 DEFICIENCY (ICD-266.2)    Route: IM    Site: L deltoid    Exp Date: 12/09/2010    Lot #: [id number]    Mfr: [employer] Regent    Patient tolerated injection without complications    Given by: Mervin Hack CMA (AAMA) (August 22, 2009 10:17 AM)  Orders Added: 1)  Vit B12 1000 mcg [J3420] 2)  Admin of Therapeutic Inj  intramuscular or subcutaneous [54098]

## 2010-09-09 NOTE — Medication Information (Signed)
Summary: rov/ewj  Anticoagulant Therapy  Managed by: Bethena Midget, RN, BSN Referring MD: Valera Castle, MD PCP: Laurita Quint MD Supervising MD: Tenny Craw MD, Gunnar Fusi Indication 1: Atrial Flutter (ICD-427.32) Lab Used: [facility] Site: [facility] INR POC 2.6 INR RANGE 2 - 3  Dietary changes: no    Health status changes: no    Bleeding/hemorrhagic complications: no    Recent/future hospitalizations: no    Any changes in medication regimen? no    Recent/future dental: no  Any missed doses?: no       Is patient compliant with meds? yes      Comments: Pt has appt for Colonoscopy on 12/18/09 and he forgot. He is still on his coumadin. After speaking with Dr Christella Hartigan Nurse, we rescheduled to 01/10/10 at 930am arrival and procedure at 1030am.  Allergies: 1)  ! * Altace 2)  ! Imdur 3)  ! * Quinidine Gluconate 4)  ! * Reopro  Anticoagulation Management History:      The patient is taking warfarin and comes in today for a routine follow up visit.  Positive risk factors for bleeding include an age of 75 years or older and presence of serious comorbidities.  The bleeding index is 'intermediate risk'.  Positive CHADS2 values include History of HTN, Age > 78 years old, and History of Diabetes.  The start date was 04/21/1999.  Anticoagulation responsible provider: Tenny Craw MD, Gunnar Fusi.  INR POC: 2.6.  Cuvette Lot#: [id number].  Exp: 01/2011.    Anticoagulation Management Assessment/Plan:      The patient's current anticoagulation dose is Coumadin 5 mg tabs: Take as directed by coumadin clinic..  The target INR is 2.0-3.0.  The next INR is due 01/17/2010.  Anticoagulation instructions were given to patient.  Results were reviewed/authorized by Bethena Midget, RN, BSN.  He was notified by Bethena Midget, RN, BSN.         Prior Anticoagulation Instructions: INR 3.9  Skip tomorrow's dosage of coumadin, then start taking 1/2 tablet daily.Recheck in 2 weeks.    Current Anticoagulation Instructions: INR  2.6 Continue 1/2 piil everyday. Take last dose on 01/04/10. Colonoscopy has been rescheduled to 01/10/10. You have to be there at 930am.

## 2010-09-09 NOTE — Medication Information (Signed)
Summary: rov/cs   Anticoagulant Therapy  Managed by: Weston Brass, PharmD Referring MD: Valera Castle, MD PCP: Laurita Quint MD Supervising MD: Eden Emms MD, Theron Arista Indication 1: Atrial Flutter (ICD-427.32) Lab Used: [facility] Site: [facility] INR POC 2.6 INR RANGE 2 - 3  Dietary changes: no    Health status changes: no    Bleeding/hemorrhagic complications: no    Recent/future hospitalizations: no    Any changes in medication regimen? no    Recent/future dental: no  Any missed doses?: no       Is patient compliant with meds? yes       Allergies: 1)  ! * Altace 2)  ! Imdur 3)  ! * Quinidine Gluconate 4)  ! * Reopro  Anticoagulation Management History:      The patient is taking warfarin and comes in today for a routine follow up visit.  Positive risk factors for bleeding include an age of 75 years or older and presence of serious comorbidities.  The bleeding index is 'intermediate risk'.  Positive CHADS2 values include History of HTN, Age > 75 years old, and History of Diabetes.  The start date was 04/21/1999.  Anticoagulation responsible provider: Eden Emms MD, Theron Arista.  INR POC: 2.6.  Cuvette Lot#: [id number].  Exp: 06/2011.    Anticoagulation Management Assessment/Plan:      The patient's current anticoagulation dose is Coumadin 5 mg tabs: Take as directed by coumadin clinic..  The target INR is 2.0-3.0.  The next INR is due 07/02/2010.  Anticoagulation instructions were given to patient.  Results were reviewed/authorized by Weston Brass, PharmD.  He was notified by Ilean Skill D candidate.         Prior Anticoagulation Instructions: INR 2.7  Continue Coumadin as scheduled:  1/2 tablet every day of the week.   Return in 2 weeks.   Current Anticoagulation Instructions: INR 2.6  Continue taking same dose of 1/2 tablet everyday. Recheck in 4 weeks.

## 2010-09-09 NOTE — Letter (Signed)
Summary: Handout Printed  Printed Handout:  - Coumadin Instructions-w/out Meds 

## 2010-09-09 NOTE — Letter (Signed)
Summary: Anticoagulation Modification Letter  [facility]  [address] [place], [place] [address]   Phone: [phone]  Fax: [phone]    October 25, 2009  Re:    Eric [name] DOB:    11-03-1927 MRN:    [id number]    Dear Dr Daleen Squibb:  We have scheduled the above patient for an endoscopic procedure. Our records show that  he/she is on anticoagulation therapy. Please advise as to how long the patient may come off their therapy of coumadin prior to the scheduled procedure(s) on 12/04/09.   Please fax back/or route the completed form to Patty at [phone].  Thank you for your help with this matter.  Sincerely,  Chales Abrahams CMA Duncan Dull)   Physician Recommendation:  Hold Plavix 7 days prior ________________  Hold Coumadin 5 days prior ____________  Other ______________________________     Appended Document: Anticoagulation Modification Letter response recieved pt aware letter to be scanned

## 2010-09-09 NOTE — Medication Information (Signed)
Summary: rov/tm  Anticoagulant Therapy  Managed by: Cloyde Reams, RN, BSN Referring MD: Valera Castle MD PCP: Laurita Quint MD Supervising MD: Jens Som MD, Arlys John Indication 1: Atrial Flutter (ICD-427.32) Lab Used: [facility] Site: [facility] INR POC 2.3 INR RANGE 2 - 3  Dietary changes: no    Health status changes: no    Bleeding/hemorrhagic complications: no    Recent/future hospitalizations: no    Any changes in medication regimen? no    Recent/future dental: no  Any missed doses?: yes     Details: Missed 1 dose approx 1 month ago.  Is patient compliant with meds? yes       Current Medications (verified): 1)  Metoprolol Tartrate 100 Mg Tabs (Metoprolol Tartrate) .... 1/2 Tab Qam..1 Whole Tab At Bedtime 2)  Lovastatin 40 Mg  Tabs (Lovastatin) .... 2 Tabs By Mouth At Bedtime 3)  Lisinopril 5 Mg Tabs (Lisinopril) .... Take One Tablet By Mouth in The Morning and 1/2 Tablet in The Evening. 4)  Aspir-Low 81 Mg Tbec (Aspirin) .[name].. 1  Daily By Mouth 5)  Fish Oil 1000 Mg Caps (Omega-3 Fatty Acids) .[name].. 1tablet Twice A Day By Mouth 6)  Daily Multiple Vitamins  Tabs (Multiple Vitamin) .[name].. 1 Tablet Daily By Mouth 7)  Metformin Hcl 500 Mg  Tabs (Metformin Hcl) .... One Tab By Mouth Two Times A Day 8)  Tylenol Extra Strength 500 Mg  Tabs (Acetaminophen) .... 2 Tablets By Mouth As Needed Pain 9)  Hydrochlorothiazide 12.5 Mg  Tabs (Hydrochlorothiazide) .[name].. 1 Daily By Mouth 10)  Fenofibrate 160 Mg Tabs (Fenofibrate) .... One Tab By Mouth Once Daily 11)  Coumadin 5 Mg Tabs (Warfarin Sodium) .... Take As Directed By Coumadin Clinic.  Allergies (verified): 1)  ! * Altace 2)  ! Imdur 3)  ! * Quinidine Gluconate 4)  ! * Reopro  Anticoagulation Management History:      The patient is taking warfarin and comes in today for a routine follow up visit.  Positive risk factors for bleeding include an age of 47 years or older and presence of serious comorbidities.  The bleeding index is  'intermediate risk'.  Positive CHADS2 values include History of HTN, Age > 73 years old, and History of Diabetes.  The start date was 04/21/1999.  Anticoagulation responsible provider: Jens Som MD, Arlys John.  INR POC: 2.3.  Cuvette Lot#: [id number].  Exp: 09/2010.    Anticoagulation Management Assessment/Plan:      The patient's current anticoagulation dose is Coumadin 5 mg tabs: Take as directed by coumadin clinic..  The target INR is 2.0-3.0.  The next INR is due 09/09/2009.  Anticoagulation instructions were given to patient.  Results were reviewed/authorized by Cloyde Reams, RN, BSN.  He was notified by Cloyde Reams RN.         Prior Anticoagulation Instructions: INR 2.7 Continue 2.5mg s daily. Recheck in 4 weeks.   Current Anticoagulation Instructions: INR 2.3  Continue on same dosage 2.5mg  daily.  Recheck in 4 weeks.

## 2010-09-09 NOTE — Letter (Signed)
Summary: Colonoscopy Letter  [facility]  [address] [place], [place] [address]   Phone: [phone]  Fax: [phone]      September 03, 2009 MRN: [id number]   [facility] [address] [place], [place]  [address]   Dear Mr. THEARD,   According to your medical record, it is time for you to schedule a Colonoscopy. The American Cancer Society recommends this procedure as a method to detect early colon cancer. Patients with a family history of colon cancer, or a personal history of colon polyps or inflammatory bowel disease are at increased risk.  This letter has beeen generated based on the recommendations made at the time of your procedure. If you feel that in your particular situation this may no longer apply, please contact our office.  Please call our office at [phone] to schedule this appointment or to update your records at your earliest convenience.  Thank you for cooperating with [place] to provide you with the very best care possible.   Sincerely,  Rachael Fee, M.D.  [facility] [phone]

## 2010-09-09 NOTE — Medication Information (Signed)
Summary: rov/jm   Anticoagulant Therapy  Managed by: Weston Brass, PharmD Referring MD: Valera Castle, MD PCP: Laurita Quint MD Supervising MD: Joy Haegele Indication 1: Atrial Flutter (ICD-427.32) Lab Used: [facility] Site: [facility] INR POC 1.2 INR RANGE 2 - 3  Dietary changes: no    Health status changes: no    Bleeding/hemorrhagic complications: no    Recent/future hospitalizations: no    Any changes in medication regimen? no    Recent/future dental: no  Any missed doses?: no       Is patient compliant with meds? yes       Allergies: 1)  ! * Altace 2)  ! Imdur 3)  ! * Quinidine Gluconate 4)  ! * Reopro  Anticoagulation Management History:      The patient is taking warfarin and comes in today for a routine follow up visit.  Positive risk factors for bleeding include an age of 75 years or older and presence of serious comorbidities.  The bleeding index is 'intermediate risk'.  Positive CHADS2 values include History of HTN, Age > 65 years old, and History of Diabetes.  The start date was 04/21/1999.  Anticoagulation responsible provider: Iley Breeden.  INR POC: 1.2.  Exp: 05/2011.    Anticoagulation Management Assessment/Plan:      The patient's current anticoagulation dose is Coumadin 5 mg tabs: Take as directed by coumadin clinic..  The target INR is 2.0-3.0.  The next INR is due 05/21/2010.  Anticoagulation instructions were given to patient.  Results were reviewed/authorized by Weston Brass, PharmD.  He was notified by Weston Brass PharmD.         Prior Anticoagulation Instructions: INR 2.8   Continue taking one-half tablet every day.  We will see you in four weeks.  Current Anticoagulation Instructions: INR 1.2  Take an extra 1 tablet today, 1 tablet tomorrow then resume 1/2 tablet every day.  Recheck INR in 1 week.

## 2010-09-09 NOTE — Medication Information (Signed)
Summary: rov/tm  Anticoagulant Therapy  Managed by: Bethena Midget, RN, BSN Referring MD: Valera Castle, MD PCP: Laurita Quint MD Supervising MD: Shirlee Latch MD, Kallie Depolo Indication 1: Atrial Flutter (ICD-427.32) Lab Used: [facility] Site: [facility] INR POC 2.8 INR RANGE 2 - 3  Dietary changes: no    Health status changes: no    Bleeding/hemorrhagic complications: no    Recent/future hospitalizations: no    Any changes in medication regimen? no    Recent/future dental: no  Any missed doses?: no       Is patient compliant with meds? yes       Allergies: 1)  ! * Altace 2)  ! Imdur 3)  ! * Quinidine Gluconate 4)  ! * Reopro  Anticoagulation Management History:      The patient is taking warfarin and comes in today for a routine follow up visit.  Positive risk factors for bleeding include an age of 19 years or older and presence of serious comorbidities.  The bleeding index is 'intermediate risk'.  Positive CHADS2 values include History of HTN, Age > 29 years old, and History of Diabetes.  The start date was 04/21/1999.  Anticoagulation responsible provider: Shirlee Latch MD, Maisen Schmit.  INR POC: 2.8.  Cuvette Lot#: [id number].  Exp: 04/2011.    Anticoagulation Management Assessment/Plan:      The patient's current anticoagulation dose is Coumadin 5 mg tabs: Take as directed by coumadin clinic..  The target INR is 2.0-3.0.  The next INR is due 03/17/2010.  Anticoagulation instructions were given to patient.  Results were reviewed/authorized by Bethena Midget, RN, BSN.  He was notified by Bethena Midget, RN, BSN.         Prior Anticoagulation Instructions: INR 2.5 Continue 1/2 pill everyday. Recheck in 3 weeks.   Current Anticoagulation Instructions: INR 2.8 Continue 2.5mg s daily. Recheck in 4 weeks.

## 2010-09-09 NOTE — Medication Information (Signed)
Summary: Eric [name]      Allergies Added:  Anticoagulant Therapy  Managed by: Reina Fuse, PharmD Referring MD: Valera Castle, MD PCP: Laurita Quint MD Supervising MD: Daleen Squibb MD, Maisie Fus Indication 1: Atrial Flutter (ICD-427.32) Lab Used: [facility] Site: [facility] INR POC 2.8 INR RANGE 2 - 3  Dietary changes: no    Health status changes: no    Bleeding/hemorrhagic complications: no    Recent/future hospitalizations: no    Any changes in medication regimen? no    Recent/future dental: no  Any missed doses?: no       Is patient compliant with meds? yes       Current Medications (verified): 1)  Metoprolol Tartrate 100 Mg Tabs (Metoprolol Tartrate) .[name].. 1 Tab By Mouth Two Times A Day 2)  Lisinopril 5 Mg Tabs (Lisinopril) .... Take One Tablet By Mouth in The Morning and 1/2 Tablet in The Evening. 3)  Aspir-Low 81 Mg Tbec (Aspirin) .[name].. 1  Daily By Mouth 4)  Fish Oil 1000 Mg Caps (Omega-3 Fatty Acids) .[name].. 1tablet Twice A Day By Mouth 5)  Daily Multiple Vitamins  Tabs (Multiple Vitamin) .[name].. 1 Tablet Daily By Mouth 6)  Metformin Hcl 500 Mg  Tabs (Metformin Hcl) .... One Tab By Mouth Two Times A Day 7)  Tylenol Extra Strength 500 Mg  Tabs (Acetaminophen) .... 2 Tablets By Mouth As Needed Pain 8)  Hydrochlorothiazide 12.5 Mg  Tabs (Hydrochlorothiazide) .[name].. 1 Daily By Mouth 9)  Fenofibrate 160 Mg Tabs (Fenofibrate) .... One Tab By Mouth Once Daily 10)  Coumadin 5 Mg Tabs (Warfarin Sodium) .... Take As Directed By Coumadin Clinic. 11)  Simvastatin 40 Mg Tabs (Simvastatin) .... Take One Tablet By Mouth Daily At Bedtime 12)  B Complex-B12  Tabs (B Complex Vitamins) .[name].. 1000 Micrograms (1 Tablet) Daily. 13)  Vitamin D 1000 Unit Tabs (Cholecalciferol) .... Take Twice A Week  Allergies (verified): 1)  ! * Altace 2)  ! Imdur 3)  ! * Quinidine Gluconate 4)  ! * Reopro  Anticoagulation Management History:      The patient is taking warfarin and comes in today for a routine follow  up visit.  Positive risk factors for bleeding include an age of 75 years or older and presence of serious comorbidities.  The bleeding index is 'intermediate risk'.  Positive CHADS2 values include History of HTN, Age > 21 years old, and History of Diabetes.  The start date was 04/21/1999.  Anticoagulation responsible provider: Daleen Squibb MD, Maisie Fus.  INR POC: 2.8.  Cuvette Lot#: [id number].  Exp: 04/2011.    Anticoagulation Management Assessment/Plan:      The patient's current anticoagulation dose is Coumadin 5 mg tabs: Take as directed by coumadin clinic..  The target INR is 2.0-3.0.  The next INR is due 04/15/2010.  Anticoagulation instructions were given to patient.  Results were reviewed/authorized by Reina Fuse, PharmD.  He was notified by Reina Fuse PharmD.         Prior Anticoagulation Instructions: INR 2.8 Continue 2.5mg s daily. Recheck in 4 weeks.   Current Anticoagulation Instructions: INR 2.8  Take Coumadin 0.5 tab (2.5 mg) every day. Return to clinic in 4 weeks.

## 2010-09-09 NOTE — Assessment & Plan Note (Signed)
Review of gastrointestinal problems: 1. Adenomatous colon polyps removed from colon February 2008, recall colonoscopy at 3 year interval     History of Present Illness Visit Type: Initial Visit Primary GI MD: Rob Bunting MD Primary Provider: Laurita Quint MD Chief Complaint: consult colonoscopy History of Present Illness:     very pleasant 77 year old man whom I last saw about 3 ago. Since then he's had no serious medical issues (no major CAD, diagnosis of cancer). He's been taking coumadin for many years for a fibrillation, atrial flutter.  he has noticed a gradual loosening of his stools over the past year or so. He will go 3-6 times a day usually. This can be loose, never bloody however. He has never tried Imodium or fiber supplements to see if that makes a difference.           Current Medications (verified): 1)  Metoprolol Tartrate 100 Mg Tabs (Metoprolol Tartrate) .... 1/2 Tab Every Am..1 Whole Tab At Bedtime 2)  Lisinopril 5 Mg Tabs (Lisinopril) .... Take One Tablet By Mouth in The Morning and 1/2 Tablet in The Evening. 3)  Aspir-Low 81 Mg Tbec (Aspirin) .[name].. 1  Daily By Mouth 4)  Fish Oil 1000 Mg Caps (Omega-3 Fatty Acids) .[name].. 1tablet Twice A Day By Mouth 5)  Daily Multiple Vitamins  Tabs (Multiple Vitamin) .[name].. 1 Tablet Daily By Mouth 6)  Metformin Hcl 500 Mg  Tabs (Metformin Hcl) .... One Tab By Mouth Two Times A Day 7)  Tylenol Extra Strength 500 Mg  Tabs (Acetaminophen) .... 2 Tablets By Mouth As Needed Pain 8)  Hydrochlorothiazide 12.5 Mg  Tabs (Hydrochlorothiazide) .[name].. 1 Daily By Mouth 9)  Fenofibrate 160 Mg Tabs (Fenofibrate) .... One Tab By Mouth Once Daily 10)  Coumadin 5 Mg Tabs (Warfarin Sodium) .... Take As Directed By Coumadin Clinic. 11)  Lovastatin 40 Mg Tabs (Lovastatin) .... Take 2 Tablets By Mouth At Bedtime 12)  B Complex-B12  Tabs (B Complex Vitamins) .[name].. 1000 Micrograms (1 Tablet) Daily. 13)  Vitamin D 1000 Unit Tabs (Cholecalciferol) ....  Take Twice A Week  Allergies (verified): 1)  ! * Altace 2)  ! Imdur 3)  ! * Quinidine Gluconate 4)  ! * Reopro  Vital Signs:  Patient profile:   77 year old male Height:      65 inches Weight:      186 pounds BMI:     31.06 BSA:     1.92 Pulse rate:   64 / minute Pulse rhythm:   irregular BP sitting:   166 / 90  (left arm)  Vitals Entered By: Merri Ray CMA Duncan Dull) (October 25, 2009 1:16 PM)  Physical Exam  Additional Exam:  Constitutional: generally well appearing Psychiatric: alert and oriented times 3 Abdomen: soft, non-tender, non-distended, normal bowel sounds    Impression & Recommendations:  Problem # 1:  Personal history of precancerous colon polyps he is due for repeat colonoscopy around now. We will schedule this for many his soonest convenience. He is at increased risk for bleeding complications since he takes Coumadin for atrial fibrillation. We will contact his cardiologist to make sure that it is safe for him to hold for 5 days prior to his colonoscopy.  Problem # 2:  loose stools I recommended a trial of fiber supplements to see if that helps bulk his stools up a bit. He will continue this until his colonoscopy which we will not schedule for about one month from now so I can really get  a good idea of whether the fiber supplements have helped him or made it worse.  Patient Instructions: 1)  WE will get in contact with Dr. Daleen Squibb to consider holding your coumadin for 5 days prior to colonoscopy. 2)  You will be scheduled to have a colonoscopy. 3)  You should begin taking citrucel powder fiber supplement (orange flavor).  Start with a small spoonful and increase this over 1 week to a full, heaping spoonful daily.  You may notice some bloating when you first start the fiber, but that usually resolves after a few days. 4)  The medication list was reviewed and reconciled.  All changed / newly prescribed medications were explained.  A complete medication list was  provided to the patient / caregiver.  Appended Document: Orders Update/movi    Clinical Lists Changes  Problems: Added new problem of PERSONAL HISTORY OF COLONIC POLYPS (ICD-V12.72) Added new problem of DIARRHEA (ICD-787.91) Medications: Added new medication of MOVIPREP 100 GM  SOLR (PEG-KCL-NACL-NASULF-NA ASC-C) As per prep instructions. - Signed Rx of MOVIPREP 100 GM  SOLR (PEG-KCL-NACL-NASULF-NA ASC-C) As per prep instructions.;  #1 x 0;  Signed;  Entered by: Chales Abrahams CMA (AAMA);  Authorized by: Rachael Fee MD;  Method used: Electronically to [facility] [address], [place], [place], [place]  [address], Ph: [phone], Fax: [phone] Orders: Added new Test order of Colonoscopy (Colon) - Signed    Prescriptions: MOVIPREP 100 GM  SOLR (PEG-KCL-NACL-NASULF-NA ASC-C) As per prep instructions.  #1 x 0   Entered by:   Chales Abrahams CMA (AAMA)   Authorized by:   Rachael Fee MD   Signed by:   Chales Abrahams CMA (AAMA) on 10/25/2009   Method used:   Electronically to        [facility] [address] (retail)       [address], [address]       [place], [place]  [address]       Ph: [phone]       Fax: [phone]   RxID:   [id number]

## 2010-09-09 NOTE — Medication Information (Signed)
Summary: [facility] Physician Order  [facility] Physician Order   Imported By: Beau Fanny 11/15/2009 10:13:31  _____________________________________________________________________  External Attachment:    Type:   Image     Comment:   External Document

## 2010-09-09 NOTE — Assessment & Plan Note (Signed)
Summary: Cardiology Nuclear Study  Nuclear Med Background Indications for Stress Test: Evaluation for Ischemia, Graft Patency, PTCA Patency   History: Angioplasty, CABG, Cardioversion, COPD, Heart Catheterization, Myocardial Perfusion Study  History Comments: '00 CABG x 3; 4/03 PTCA-LAD; '07 Cardioversion:for atrial flutter;7/08 MPS: normal, EF=65%; h/o afib  Symptoms: DOE    Nuclear Pre-Procedure Cardiac Risk Factors: Family History - CAD, History of Smoking, Hypertension, Lipids, NIDDM Caffeine/Decaff Intake: none NPO After: 9:30 PM Lungs: Clear.  O2 Sat 98% on RA. IV 0.9% NS with Angio Cath: 20g     IV Site: (R) AC IV Started by: Stanton Kidney EMT-P Chest Size (in) 42     Height (in): 66.5 Weight (lb): 184 BMI: 29.36 Tech Comments: CBG= 106 @ 7:00 am, per Pt. Metoprolol held x 17 hours.  Nuclear Med Study 1 or 2 day study:  1 day     Stress Test Type:  Eugenie Birks Reading MD:  Marca Ancona, MD     Referring MD:  Valera Castle, MD Resting Radionuclide:  Technetium 55m Tetrofosmin     Resting Radionuclide Dose:  11 mCi  Stress Radionuclide:  Technetium 53m Tetrofosmin     Stress Radionuclide Dose:  33 mCi   Stress Protocol   Lexiscan: 0.4 mg   Stress Test Technologist:  Rea College CMA-N     Nuclear Technologist:  Domenic Polite CNMT  Rest Procedure  Myocardial perfusion imaging was performed at rest 45 minutes following the intravenous administration of Myoview Technetium 90m Tetrofosmin.  Stress Procedure  The patient received IV Lexiscan 0.4 mg over 15-seconds.  Myoview injected at 30-seconds.  There were no significant changes with lexiscan.  Quantitative spect images were obtained after a 45 minute delay.  QPS Raw Data Images:  Normal; no motion artifact; normal heart/lung ratio. Stress Images:  NI: Uniform and normal uptake of tracer in all myocardial segments. Rest Images:  Uniform and normal uptake of tracer in all myocardial segments. Subtraction (SDS):  There  is no evidence of scar or ischemia. Transient Ischemic Dilatation:  1.02  (Normal <1.22)  Lung/Heart Ratio:  .29  (Normal <0.45)  Quantitative Gated Spect Images QGS EDV:  94 ml QGS ESV:  36 ml QGS EF:  62 % QGS cine images:  Normal wall motion.    Overall Impression  Exercise Capacity: Lexiscan study BP Response: Normal blood pressure response. Clinical Symptoms: Throat tickling ECG Impression: Baseline atrial fibrillation, no significant change with Lexiscan.  Overall Impression: Normal stress nuclear study.  Appended Document: Cardiology Nuclear Study STABLE, REASSURANCE, NO CHANGE IN MEDS.  Appended Document: Cardiology Nuclear Study PT AWARW./CY

## 2010-09-09 NOTE — Assessment & Plan Note (Signed)
Summary: ROA 3 MTHS CYD   Vital Signs:  Patient profile:   77 year old male Weight:      182.75 pounds Temp:     98.1 degrees F oral Pulse rate:   76 / minute Pulse rhythm:   irregular BP sitting:   160 / 100  (left arm) Cuff size:   regular  Vitals Entered By: Sydell Axon LPN (Dec 24, 2009 9:58 AM) CC: 3 Month follow-up, patient states that his BP has been up recently in the mornings and usually comes down by lunch   History of Present Illness: Pt here for 3 mo followup. We switched his Lovastatin to  Simva and increased slightly to better control LDL. He is tolerating it fine. He complains today of BP being elevated in the morning, gets better as the day goes on.  He got over the coold I saw him for last month and is scheduled to see Dr Christella Hartigan for colonoscopy the beggining of next month. He saw Dr Daleen Squibb recently and was stable.  Problems Prior to Update: 1)  Diarrhea  (ICD-787.91) 2)  Personal History of Colonic Polyps  (ICD-V12.72) 3)  Vitamin B12 Deficiency  (ICD-266.2) 4)  Coronary Artery Disease, S/p Cabg 2000  (ICD-414.00) 5)  Atrial Fibrillation  (ICD-427.31) 6)  Atrial Flutter  (ICD-427.32) 7)  Coumadin Therapy, Sec. A-fib  (ICD-V58.61) 8)  Hypertriglyceridemia  (ICD-272.1) 9)  Hypertension  (ICD-401.9) 10)  Diabetes Mellitus, Type II  (ICD-250.00) 11)  Special Screening Malignant Neoplasm of Prostate  (ICD-V76.44)  Medications Prior to Update: 1)  Metoprolol Tartrate 100 Mg Tabs (Metoprolol Tartrate) .... 1/2 Tab Every Am..1 Whole Tab At Bedtime 2)  Lisinopril 5 Mg Tabs (Lisinopril) .... Take One Tablet By Mouth in The Morning and 1/2 Tablet in The Evening. 3)  Aspir-Low 81 Mg Tbec (Aspirin) .[name].. 1  Daily By Mouth 4)  Fish Oil 1000 Mg Caps (Omega-3 Fatty Acids) .[name].. 1tablet Twice A Day By Mouth 5)  Daily Multiple Vitamins  Tabs (Multiple Vitamin) .[name].. 1 Tablet Daily By Mouth 6)  Metformin Hcl 500 Mg  Tabs (Metformin Hcl) .... One Tab By Mouth Two Times A Day 7)   Tylenol Extra Strength 500 Mg  Tabs (Acetaminophen) .... 2 Tablets By Mouth As Needed Pain 8)  Hydrochlorothiazide 12.5 Mg  Tabs (Hydrochlorothiazide) .[name].. 1 Daily By Mouth 9)  Fenofibrate 160 Mg Tabs (Fenofibrate) .... One Tab By Mouth Once Daily 10)  Coumadin 5 Mg Tabs (Warfarin Sodium) .... Take As Directed By Coumadin Clinic. 11)  Simvastatin 40 Mg Tabs (Simvastatin) .... Take One Tablet By Mouth Daily At Bedtime 12)  B Complex-B12  Tabs (B Complex Vitamins) .[name].. 1000 Micrograms (1 Tablet) Daily. 13)  Vitamin D 1000 Unit Tabs (Cholecalciferol) .... Take Twice A Week 14)  Trimox 500 Mg Caps (Amoxicillin) .... 2 Tabs By Mouth Two Times A Day  Allergies: 1)  ! * Altace 2)  ! Imdur 3)  ! * Quinidine Gluconate 4)  ! * Reopro  Physical Exam  General:  Well-developed,well-nourished,in no acute distress; alert,appropriate and cooperative throughout examination, mildly obese and clear. Head:  Normocephalic and atraumatic without obvious abnormalities. No apparent alopecia but mild  balding. Sinuses NT.  Eyes:  Conjunctiva clear bilaterally. Ears:  External ear exam shows no significant lesions or deformities.  Otoscopic examination reveals clear canals, tympanic membranes are intact bilaterally without bulging, retraction, inflammation or discharge. Hearing is grossly normal bilaterally. Hearing aids in place bilat, remotely controlled. Nose:  External nasal  examination shows no deformity or inflammation. Nasal mucosa are pink and moist without lesions or exudates. Minimal congestion with clear discharge. Mouth:  Oral mucosa and oropharynx without lesions or exudates.  Teeth in good repair.  Neck:  No deformities, masses, or tenderness noted. Lungs:  Normal respiratory effort, chest expands symmetrically. Lungs are clear to auscultation, no crackles or wheezes.  Heart:  Normal rate and regular rhythm. S1 and S2 normal without gallop, murmur, click, rub or other extra sounds.   Impression &  Recommendations:  Problem # 1:  HYPERTENSION (ICD-401.9) Assessment Deteriorated  Incr Metoprolol to 100mg  two times a day. His updated medication list for this problem includes:    Metoprolol Tartrate 100 Mg Tabs (Metoprolol tartrate) .[name]... 1 tab by mouth two times a day    Lisinopril 5 Mg Tabs (Lisinopril) .[name]... Take one tablet by mouth in the morning and 1/2 tablet in the evening.    Hydrochlorothiazide 12.5 Mg Tabs (Hydrochlorothiazide) .[name]... 1 daily by mouth  BP today: 160/100 Prior BP: 122/78 (11/21/2009)  Labs Reviewed: K+: 5.0 (09/20/2009) Creat: : 1.7 (09/20/2009)   Chol: 159 (12/20/2009)   HDL: 29.50 (12/20/2009)   LDL: 93 (12/20/2009)   TG: 185.0 (12/20/2009)  Orders: Prescription Created Electronically [id number])  Problem # 2:  HYPERTRIGLYCERIDEMIA (ICD-272.1)  LDL better, HDL low and Trig high. Will try to avoid adding. Encouraged more exertional activity. His updated medication list for this problem includes:    Fenofibrate 160 Mg Tabs (Fenofibrate) ..... One tab by mouth once daily    Simvastatin 40 Mg Tabs (Simvastatin) .[name]... Take one tablet by mouth daily at bedtime  Labs Reviewed: SGOT: 33 (12/20/2009)   SGPT: 25 (12/20/2009)   HDL:29.50 (12/20/2009), 34.20 (09/20/2009)  LDL:93 (12/20/2009), 99 (04/05/2009)  Chol:159 (12/20/2009), 175 (09/20/2009)  Trig:185.0 (12/20/2009), 203.0 (09/20/2009)  Problem # 3:  DIABETES MELLITUS, TYPE II (ICD-250.00) Assessment: Unchanged  Encouraged to watch intake. His updated medication list for this problem includes:    Lisinopril 5 Mg Tabs (Lisinopril) .[name]... Take one tablet by mouth in the morning and 1/2 tablet in the evening.    Aspir-low 81 Mg Tbec (Aspirin) .[name]... 1  daily by mouth    Metformin Hcl 500 Mg Tabs (Metformin hcl) ..... One tab by mouth two times a day  Labs Reviewed: Creat: 1.7 (09/20/2009)     Last Eye Exam: normal (03/24/2009) Reviewed HgBA1c results: 6.3 (09/20/2009)  6.1 (04/05/2009)  Complete  Medication List: 1)  Metoprolol Tartrate 100 Mg Tabs (Metoprolol tartrate) .[name].. 1 tab by mouth two times a day 2)  Lisinopril 5 Mg Tabs (Lisinopril) .... Take one tablet by mouth in the morning and 1/2 tablet in the evening. 3)  Aspir-low 81 Mg Tbec (Aspirin) .[name].. 1  daily by mouth 4)  Fish Oil 1000 Mg Caps (Omega-3 fatty acids) .[name].. 1tablet twice a day by mouth 5)  Daily Multiple Vitamins Tabs (Multiple vitamin) .[name].. 1 tablet daily by mouth 6)  Metformin Hcl 500 Mg Tabs (Metformin hcl) .... One tab by mouth two times a day 7)  Tylenol Extra Strength 500 Mg Tabs (Acetaminophen) .... 2 tablets by mouth as needed pain 8)  Hydrochlorothiazide 12.5 Mg Tabs (Hydrochlorothiazide) .[name].. 1 daily by mouth 9)  Fenofibrate 160 Mg Tabs (Fenofibrate) .... One tab by mouth once daily 10)  Coumadin 5 Mg Tabs (Warfarin sodium) .... Take as directed by coumadin clinic. 11)  Simvastatin 40 Mg Tabs (Simvastatin) .... Take one tablet by mouth daily at bedtime 12)  B Complex-b12 Tabs (  B complex vitamins) .[name].. 1000 micrograms (1 tablet) daily. 13)  Vitamin D 1000 Unit Tabs (Cholecalciferol) .... Take twice a week 14)  Trimox 500 Mg Caps (Amoxicillin) .... 2 tabs by mouth two times a day  Patient Instructions: 1)  RTC one month for BP check.  2)  Increase Metoprolol to 100mg  two times a day.  Prescriptions: METOPROLOL TARTRATE 100 MG TABS (METOPROLOL TARTRATE) 1 tab by mouth two times a day  #60 x 12   Entered and Authorized by:   Shaune Leeks MD   Signed by:   Shaune Leeks MD on 12/24/2009   Method used:   Electronically to        [facility] [address] (retail)       [address]       [place], [place]  [address]       Ph: [phone]       Fax: [phone]   RxID:   [id number]   Current Allergies (reviewed today): ! * ALTACE ! IMDUR ! * QUINIDINE GLUCONATE ! * REOPRO

## 2010-09-09 NOTE — Progress Notes (Signed)
Summary: Nuclear Pre-Procedure  Phone Note Outgoing Call Call back at [facility] Phone [phone]   Call placed by: Stanton Kidney, EMT-P,  November 13, 2009 3:35 PM Call placed to: Patient Action Taken: Phone Call Completed Summary of Call: Reviewed information on Myoview Information Sheet (see scanned document for further details).  Spoke with Patient.    Nuclear Med Background Indications for Stress Test: Evaluation for Ischemia, Graft Patency, PTCA Patency   History: Angioplasty, CABG, Cardioversion, COPD, Heart Catheterization, Myocardial Perfusion Study  History Comments: '00 CABG x 3 4/03 Angioplasty:LAD '07 Cardioversion: A. Flutter 7/08 MPS: EF=65%, (-)  Symptoms: DOE    Nuclear Pre-Procedure Cardiac Risk Factors: Family History - CAD, History of Smoking, Hypertension, Lipids, NIDDM Height (in): 65  Nuclear Med Study Referring MD:  Valera Castle MD

## 2010-09-09 NOTE — Progress Notes (Signed)
Summary: form for diabetic supplies  Phone Note From Pharmacy   Caller: [employer] of Call: Form for diabetic supplies is on your shelf. Initial call taken by: Lowella Petties CMA,  November 14, 2009 3:56 PM  Follow-up for Phone Call        Signed. Thank You. Follow-up by: Shaune Leeks MD,  November 14, 2009 4:40 PM  Additional Follow-up for Phone Call Additional follow up Details #1::        Form faxed, placed up front for scanning. Additional Follow-up by: Lowella Petties CMA,  November 14, 2009 4:56 PM

## 2010-09-09 NOTE — Letter (Signed)
Summary: New Patient letter  [facility]  [address] [place], [place] [address]   Phone: [phone]  Fax: [phone]       09/17/2009 MRN: [id number]  [facility] [address] [place], [place]  [address]  Dear Eric [name],  Welcome to the Gastroenterology Division at [facility].    You are scheduled to see Dr. Christella Hartigan on 10-25-09 at 1:30p.m. on the 3rd floor at [facility], [address]  We ask that you try to arrive at our office 15 minutes prior to your appointment time to allow for check-in.  We would like you to complete the enclosed self-administered evaluation form prior to your visit and bring it with you on the day of your appointment.  We will review it with you.  Also, please bring a complete list of all your medications or, if you prefer, bring the medication bottles and we will list them.  Please bring your insurance card so that we may make a copy of it.  If your insurance requires a referral to see a specialist, please bring your referral form from your primary care physician.  Co-payments are due at the time of your visit and may be paid by cash, check or credit card.     Your office visit will consist of a consult with your physician (includes a physical exam), any laboratory testing he/she may order, scheduling of any necessary diagnostic testing (e.g. x-ray, ultrasound, CT-scan), and scheduling of a procedure (e.g. Endoscopy, Colonoscopy) if required.  Please allow enough time on your schedule to allow for any/all of these possibilities.    If you cannot keep your appointment, please call [phone] to cancel or reschedule prior to your appointment date.  This allows [place] the opportunity to schedule an appointment for another patient in need of care.  If you do not cancel or reschedule by 5 p.m. the business day prior to your appointment date, you will be charged a $50.00 late cancellation/no-show fee.    Thank you for  choosing [facility] for your medical needs.  We appreciate the opportunity to care for you.  Please visit [place] at our website  to learn more about our practice.                     Sincerely,                                                             The Gastroenterology Division

## 2010-09-09 NOTE — Assessment & Plan Note (Signed)
Summary: CPX/RBH   Vital Signs:  Patient profile:   77 year old male Weight:      183.75 pounds Temp:     98.3 degrees F oral Pulse rate:   68 / minute Pulse rhythm:   irregular BP sitting:   124 / 84  (left arm) Cuff size:   regular  Vitals Entered By: Sydell Axon LPN (September 25, 2009 9:25 AM) CC: 30 Minute checkup, scheduled to have a colonoscopy by Dr. Christella Hartigan in the next couple of months per patient   History of Present Illness: Pt here for Comp Exam. Occas he has mild anterior chest discomfort, nausea or diaphoresis. It happens irreg, doesn't seem to be able to provoke it. Rolling over to other side often helps. Rolling left to right eases it.  Preventive Screening-Counseling & Management  Alcohol-Tobacco     Alcohol drinks/day: 0     Smoking Status: never     Passive Smoke Exposure: no  Caffeine-Diet-Exercise     Caffeine use/day: 2     Does Patient Exercise: yes     Type of exercise: walks at mall     Times/week: <3  Problems Prior to Update: 1)  Upper Urinary Tract Infection  (ICD-590.80) 2)  Vitamin B12 Deficiency  (ICD-266.2) 3)  Coronary Artery Disease, S/p Cabg 2000  (ICD-414.00) 4)  Atrial Fibrillation  (ICD-427.31) 5)  Atrial Flutter  (ICD-427.32) 6)  Coumadin Therapy, Sec. A-fib  (ICD-V58.61) 7)  Hypertriglyceridemia  (ICD-272.1) 8)  Hypertension  (ICD-401.9) 9)  Diabetes Mellitus, Type II  (ICD-250.00) 10)  Special Screening Malignant Neoplasm of Prostate  (ICD-V76.44)  Medications Prior to Update: 1)  Metoprolol Tartrate 100 Mg Tabs (Metoprolol Tartrate) .... 1/2 Tab Qam..1 Whole Tab At Bedtime 2)  Lovastatin 40 Mg  Tabs (Lovastatin) .... 2 Tabs By Mouth At Bedtime 3)  Lisinopril 5 Mg Tabs (Lisinopril) .... Take One Tablet By Mouth in The Morning and 1/2 Tablet in The Evening. 4)  Aspir-Low 81 Mg Tbec (Aspirin) .[name].. 1  Daily By Mouth 5)  Fish Oil 1000 Mg Caps (Omega-3 Fatty Acids) .[name].. 1tablet Twice A Day By Mouth 6)  Daily Multiple Vitamins  Tabs  (Multiple Vitamin) .[name].. 1 Tablet Daily By Mouth 7)  Metformin Hcl 500 Mg  Tabs (Metformin Hcl) .... One Tab By Mouth Two Times A Day 8)  Tylenol Extra Strength 500 Mg  Tabs (Acetaminophen) .... 2 Tablets By Mouth As Needed Pain 9)  Hydrochlorothiazide 12.5 Mg  Tabs (Hydrochlorothiazide) .[name].. 1 Daily By Mouth 10)  Fenofibrate 160 Mg Tabs (Fenofibrate) .... One Tab By Mouth Once Daily 11)  Coumadin 5 Mg Tabs (Warfarin Sodium) .... Take As Directed By Coumadin Clinic.  Allergies: 1)  ! * Altace 2)  ! Imdur 3)  ! * Quinidine Gluconate 4)  ! * Reopro  Past History:  Past Medical History: Last updated: 12/25/2008 CORONARY ARTERY DISEASE, S/P CABG 2000 (ICD-414.00) ATRIAL FIBRILLATION (ICD-427.31) ATRIAL FLUTTER (ICD-427.32) COUMADIN THERAPY, SEC. A-FIB (ICD-V58.61) HYPERTRIGLYCERIDEMIA (ICD-272.1) HYPERTENSION (ICD-401.9) DIABETES MELLITUS, TYPE II (ICD-250.00) BRONCHITIS- ACUTE (ICD-466.0) SPECIAL SCREENING MALIGNANT NEOPLASM OF PROSTATE (ICD-V76.44)    Past Surgical History: Last updated: 03/03/2007 Angioplasty, CAD- diagonal  with rotational atherectomy 01/99 Carpel tunnel (?) bilateral Cath (cardiac ) angioplasty 01/00 Arthrectomy of LAD & PTCA CABG, CAD 2000 Admit unstable angina 04/03 PTCA Cardioversion 01/04 Hospital- a flutter/ cardioversion 05/07 Adenosine Myoview EF 56%, neg. Ischemia 03/06 Colonoscopy/ sigmoid polyps Bx. negative 10/01/06 (3 yrs) Adenosine Myoview nml 02/18/07  Family History: Last updated: 09/25/2009  Fatrher dec 59 CVA Aneurysm Htn Mother dec 85 Htn Brother dec CABG Prostate Ca  Brother dec Lung Ca (smoker) blood clot Brother dec RF Valve disorder (smoker) Brother dec Lung Ca (smoker) Sister dec  19 Cerebral hemm  Sister A 75  Sister dec Breast Ca   Social History: Last updated: 09/18/2008 Occupation: [employer] Retired   Now does Air traffic controller Married 5 Chidren Former Smoker Alcohol use-no Drug use-no  Risk Factors: Alcohol Use:  0 (09/25/2009) Caffeine Use: 2 (09/25/2009) Exercise: yes (09/25/2009)  Risk Factors: Smoking Status: never (09/25/2009) Passive Smoke Exposure: no (09/25/2009)  Family History: Fatrher dec 59 CVA Aneurysm Htn Mother dec 85 Htn Brother dec CABG Prostate Ca  Brother dec Lung Ca (smoker) blood clot Brother dec RF Valve disorder (smoker) Brother dec Lung Ca (smoker) Sister dec  20 Cerebral hemm  Sister A 24  Sister dec Breast Ca   Review of Systems General:  Denies chills, fatigue, fever, sweats, weakness, and weight loss. Eyes:  Denies blurring, discharge, and eye pain. ENT:  Complains of decreased hearing; denies earache and ringing in ears; wears hearing aids. CV:  Complains of chest pain or discomfort; denies fainting, fatigue, palpitations, shortness of breath with exertion, swelling of feet, and swelling of hands; see hpi. Resp:  Denies cough, shortness of breath, and wheezing. GI:  Denies abdominal pain, bloody stools, change in bowel habits, constipation, dark tarry stools, diarrhea, indigestion, loss of appetite, nausea, vomiting, vomiting blood, and yellowish skin color. GU:  Complains of nocturia; denies discharge, dysuria, and urinary frequency; ONE TO TWO. MS:  Complains of stiffness; denies joint pain, low back pain, muscle aches, and cramps; MILD. Derm:  Complains of dryness; denies itching and rash; L ankles during winter. Neuro:  Denies numbness, poor balance, tingling, and tremors.  Physical Exam  General:  Well-developed,well-nourished,in no acute distress; alert,appropriate and cooperative throughout examination, mildly obese. Head:  Normocephalic and atraumatic without obvious abnormalities. No apparent alopecia but mild  balding.  Eyes:  Conjunctiva clear bilaterally. Ears:  External ear exam shows no significant lesions or deformities.  Otoscopic examination reveals clear canals, tympanic membranes are intact bilaterally without bulging, retraction,  inflammation or discharge. Hearing is grossly normal bilaterally. Hearing aids in place bilat, remotely controlled. Nose:  External nasal examination shows no deformity or inflammation. Nasal mucosa are pink and moist without lesions or exudates. Minimal congestion. Mouth:  Oral mucosa and oropharynx without lesions or exudates.  Teeth in good repair. Neck:  No deformities, masses, or tenderness noted. Chest Wall:  No deformities, masses, tenderness or gynecomastia noted. Breasts:  No masses but mild  gynecomastia noted Lungs:  Normal respiratory effort, chest expands symmetrically. Lungs are clear to auscultation, no crackles or wheezes.  Heart:  Normal rate and regular rhythm. S1 and S2 normal without gallop, murmur, click, rub or other extra sounds. Abdomen:  Bowel sounds positive,abdomen soft and non-tender without masses or organomegaly. Chrronic right inguinal hernia into the scrotum, reducible. Protuberant abd. Rectal:  No external abnormalities noted. Normal sphincter tone. No rectal masses or tenderness. Mild deflated circumferential ext hemms. Genitalia:  Testes bilaterally descended without nodularity, tenderness or masses. No scrotal masses or lesions except hernia on the right descended. No penis lesions or urethral discharge. Prostate:  Prostate gland firm and smooth, no enlargement, nodularity, tenderness, mass, asymmetry or induration. 10-20mg s. Msk:  No deformity or scoliosis noted of thoracic or lumbar spine.   Pulses:  R and L carotid,radial,femoral,dorsalis pedis and posterior tibial pulses are  full and equal bilaterally Extremities:  No clubbing, cyanosis, edema, or deformity noted with normal full range of motion of all joints.   Neurologic:  No cranial nerve deficits noted. Station and gait are normal.  Sensory, motor and coordinative functions appear intact. Skin:  Intact without suspicious lesions or rashes Cervical Nodes:  No lymphadenopathy noted Inguinal Nodes:  No  significant adenopathy Psych:  Cognition and judgment appear intact. Alert and cooperative with normal attention span and concentration. No apparent delusions, illusions, hallucinations  Diabetes Management Exam:    Foot Exam (with socks and/or shoes not present):       Sensory-Pinprick/Light touch:          Left medial foot (L-4): diminished          Left dorsal foot (L-5): diminished          Left lateral foot (S-1): diminished          Right medial foot (L-4): diminished          Right dorsal foot (L-5): diminished          Right lateral foot (S-1): diminished       Sensory-Monofilament:          Left foot: diminished          Right foot: diminished       Inspection:          Left foot: normal          Right foot: normal       Nails:          Left foot: thickened          Right foot: thickened    Eye Exam:       Eye Exam done elsewhere          Date: 03/24/2009          Results: normal          Done by: [facility]   Impression & Recommendations:  Problem # 1:  VITAMIN B12 DEFICIENCY (ICD-266.2) Assessment Improved Start oral OTC repl.  Problem # 2:  ATRIAL FIBRILLATION (ICD-427.31) Assessment: Improved  Stable w/ good rate control. His updated medication list for this problem includes:    Metoprolol Tartrate 100 Mg Tabs (Metoprolol tartrate) .[name]... 1/2 tab every am..1 whole tab at bedtime    Aspir-low 81 Mg Tbec (Aspirin) .[name]... 1  daily by mouth    Coumadin 5 Mg Tabs (Warfarin sodium) .[name]... Take as directed by coumadin clinic.  Reviewed the following: PT: 21.8 (03/07/2009)    Coumadin Dose (weekly): 20 mg (09/09/2009) Prior Coumadin Dose (weekly): 20 mg (09/09/2009) Next Protime: 09/30/2009 (dated on 09/09/2009)  Problem # 3:  HYPERTRIGLYCERIDEMIA (ICD-272.1) Assessment: Unchanged  LDL too high, change Lovastatin to Smva. His updated medication list for this problem includes:    Simvastatin 40 Mg Tabs (Simvastatin) ..... One tab by mouth at night    Fenofibrate  160 Mg Tabs (Fenofibrate) ..... One tab by mouth once daily  Labs Reviewed: SGOT: 36 (09/20/2009)   SGPT: 26 (09/20/2009)   HDL:34.20 (09/20/2009), 28.80 (04/05/2009)  LDL:99 (04/05/2009), DEL (04/54/0981)  Chol:175 (09/20/2009), 161 (04/05/2009)  Trig:203.0 (09/20/2009), 168.0 (04/05/2009)  Problem # 4:  HYPERTENSION (ICD-401.9) Assessment: Unchanged Adequate, cont curr meds. His updated medication list for this problem includes:    Metoprolol Tartrate 100 Mg Tabs (Metoprolol tartrate) .[name]... 1/2 tab every am..1 whole tab at bedtime    Lisinopril 5 Mg Tabs (Lisinopril) .[name]... Take one tablet by mouth  in the morning and 1/2 tablet in the evening.    Hydrochlorothiazide 12.5 Mg Tabs (Hydrochlorothiazide) .[name]... 1 daily by mouth  BP today: 124/84 Prior BP: 118/70 (05/21/2009)  Labs Reviewed: K+: 5.0 (09/20/2009) Creat: : 1.7 (09/20/2009)   Chol: 175 (09/20/2009)   HDL: 34.20 (09/20/2009)   LDL: 99 (04/05/2009)   TG: 203.0 (09/20/2009)  Problem # 5:  DIABETES MELLITUS, TYPE II (ICD-250.00) Assessment: Unchanged Adequate control. His updated medication list for this problem includes:    Lisinopril 5 Mg Tabs (Lisinopril) .[name]... Take one tablet by mouth in the morning and 1/2 tablet in the evening.    Aspir-low 81 Mg Tbec (Aspirin) .[name]... 1  daily by mouth    Metformin Hcl 500 Mg Tabs (Metformin hcl) ..... One tab by mouth two times a day  Labs Reviewed: Creat: 1.7 (09/20/2009)     Last Eye Exam: normal (03/24/2009) Reviewed HgBA1c results: 6.3 (09/20/2009)  6.1 (04/05/2009)  Problem # 6:  SPECIAL SCREENING MALIGNANT NEOPLASM OF PROSTATE (ICD-V76.44) Assessment: Unchanged Stable PSA and exam.  Complete Medication List: 1)  Metoprolol Tartrate 100 Mg Tabs (Metoprolol tartrate) .... 1/2 tab every am..1 whole tab at bedtime 2)  Simvastatin 40 Mg Tabs (Simvastatin) .... One tab by mouth at night 3)  Lisinopril 5 Mg Tabs (Lisinopril) .... Take one tablet by mouth in the morning and 1/2  tablet in the evening. 4)  Aspir-low 81 Mg Tbec (Aspirin) .[name].. 1  daily by mouth 5)  Fish Oil 1000 Mg Caps (Omega-3 fatty acids) .[name].. 1tablet twice a day by mouth 6)  Daily Multiple Vitamins Tabs (Multiple vitamin) .[name].. 1 tablet daily by mouth 7)  Metformin Hcl 500 Mg Tabs (Metformin hcl) .... One tab by mouth two times a day 8)  Tylenol Extra Strength 500 Mg Tabs (Acetaminophen) .... 2 tablets by mouth as needed pain 9)  Hydrochlorothiazide 12.5 Mg Tabs (Hydrochlorothiazide) .[name].. 1 daily by mouth 10)  Fenofibrate 160 Mg Tabs (Fenofibrate) .... One tab by mouth once daily 11)  Coumadin 5 Mg Tabs (Warfarin sodium) .... Take as directed by coumadin clinic.  Patient Instructions: 1)  Stop Lovastatin. 2)  Start Simvastatin 40mg  at night. 3)  SGOT, SGPT 272.1    6 WEEKS 4)  See me in 3 mos, SGOT, SGPT, CHOL PROFILE  272.1     prior  5)  Forms for diabetic shoes filled out and signed. Prescriptions: SIMVASTATIN 40 MG TABS (SIMVASTATIN) one tab by mouth at night  #30 x 12   Entered and Authorized by:   Shaune Leeks MD   Signed by:   Shaune Leeks MD on 09/25/2009   Method used:   Electronically to        [facility]  [address] (retail)       [address]       [place], [place]  [address]       Ph: [phone]       Fax: [phone]   RxID:   [id number]   Current Allergies (reviewed today): ! * ALTACE ! IMDUR ! * QUINIDINE GLUCONATE ! * REOPRO

## 2010-09-09 NOTE — Medication Information (Signed)
Summary: rov/sl  Anticoagulant Therapy  Managed by: Eda Keys, PharmD Referring MD: Valera Castle, MD PCP: Laurita Quint MD Supervising MD: Eden Emms MD, Theron Arista Indication 1: Atrial Flutter (ICD-427.32) Lab Used: [facility] Site: [facility] INR POC 2.8 INR RANGE 2 - 3  Dietary changes: no    Health status changes: no    Bleeding/hemorrhagic complications: no    Recent/future hospitalizations: no    Any changes in medication regimen? no    Recent/future dental: no  Any missed doses?: no       Is patient compliant with meds? yes       Allergies: 1)  ! * Altace 2)  ! Imdur 3)  ! * Quinidine Gluconate 4)  ! * Reopro  Anticoagulation Management History:      Positive risk factors for bleeding include an age of 41 years or older and presence of serious comorbidities.  The bleeding index is 'intermediate risk'.  Positive CHADS2 values include History of HTN, Age > 34 years old, and History of Diabetes.  The start date was 04/21/1999.  Anticoagulation responsible provider: Eden Emms MD, Theron Arista.  INR POC: 2.8.  Cuvette Lot#: [id number].  Exp: 05/2011.    Anticoagulation Management Assessment/Plan:      The patient's current anticoagulation dose is Coumadin 5 mg tabs: Take as directed by coumadin clinic..  The target INR is 2.0-3.0.  The next INR is due 05/14/2010.  Anticoagulation instructions were given to patient.  Results were reviewed/authorized by Eda Keys, PharmD.         Prior Anticoagulation Instructions: INR 2.8  Take Coumadin 0.5 tab (2.5 mg) every day. Return to clinic in 4 weeks.   Current Anticoagulation Instructions: INR 2.8   Continue taking one-half tablet every day.  We will see you in four weeks.

## 2010-09-09 NOTE — Medication Information (Signed)
Summary: trov.mp  Anticoagulant Therapy  Managed by: Eda Keys, PharmD Referring MD: Valera Castle MD PCP: Laurita Quint MD Supervising MD: Eden Emms MD, Theron Arista Indication 1: Atrial Flutter (ICD-427.32) Lab Used: [facility] Site: [facility] INR POC 3.4 INR RANGE 2 - 3  Dietary changes: no    Health status changes: no    Bleeding/hemorrhagic complications: no    Recent/future hospitalizations: no    Any changes in medication regimen? yes       Details: see updated med list  Recent/future dental: no  Any missed doses?: no       Is patient compliant with meds? yes       Current Medications (verified): 1)  Metoprolol Tartrate 100 Mg Tabs (Metoprolol Tartrate) .... 1/2 Tab Every Am..1 Whole Tab At Bedtime 2)  Lisinopril 5 Mg Tabs (Lisinopril) .... Take One Tablet By Mouth in The Morning and 1/2 Tablet in The Evening. 3)  Aspir-Low 81 Mg Tbec (Aspirin) .[name].. 1  Daily By Mouth 4)  Fish Oil 1000 Mg Caps (Omega-3 Fatty Acids) .[name].. 1tablet Twice A Day By Mouth 5)  Daily Multiple Vitamins  Tabs (Multiple Vitamin) .[name].. 1 Tablet Daily By Mouth 6)  Metformin Hcl 500 Mg  Tabs (Metformin Hcl) .... One Tab By Mouth Two Times A Day 7)  Tylenol Extra Strength 500 Mg  Tabs (Acetaminophen) .... 2 Tablets By Mouth As Needed Pain 8)  Hydrochlorothiazide 12.5 Mg  Tabs (Hydrochlorothiazide) .[name].. 1 Daily By Mouth 9)  Fenofibrate 160 Mg Tabs (Fenofibrate) .... One Tab By Mouth Once Daily 10)  Coumadin 5 Mg Tabs (Warfarin Sodium) .... Take As Directed By Coumadin Clinic. 11)  Lovastatin 40 Mg Tabs (Lovastatin) .... Take 2 Tablets By Mouth At Bedtime 12)  B Complex-B12  Tabs (B Complex Vitamins) .[name].. 1000 Micrograms (1 Tablet) Daily.  Allergies (verified): 1)  ! * Altace 2)  ! Imdur 3)  ! * Quinidine Gluconate 4)  ! * Reopro  Anticoagulation Management History:      The patient is taking warfarin and comes in today for a routine follow up visit.  Positive risk factors for bleeding include  an age of 75 years or older and presence of serious comorbidities.  The bleeding index is 'intermediate risk'.  Positive CHADS2 values include History of HTN, Age > 73 years old, and History of Diabetes.  The start date was 04/21/1999.  Anticoagulation responsible provider: Eden Emms MD, Theron Arista.  INR POC: 3.4.  Cuvette Lot#: [id number].  Exp: 11/2010.    Anticoagulation Management Assessment/Plan:      The patient's current anticoagulation dose is Coumadin 5 mg tabs: Take as directed by coumadin clinic..  The target INR is 2.0-3.0.  The next INR is due 10/21/2009.  Anticoagulation instructions were given to patient.  Results were reviewed/authorized by Eda Keys, PharmD.  He was notified by Eda Keys.         Prior Anticoagulation Instructions: INR 1.7  Take 1 tab each Monday and 0.5 tab on all other days.    Recheck in 3 - 4 weeks.    Current Anticoagulation Instructions: INR 3.4  Skip tomorrow's dose.  Then on Wednesday, return to original dosing schedule of 1/2 tablet daily.  Return to clinic in 3 weeks.

## 2010-09-09 NOTE — Letter (Signed)
Summary: Results Letter  [facility]  [address] [place], [place] [address]   Phone: [phone]  Fax: [phone]        January 15, 2010 MRN: [id number]    [facility] [address] [place], [place]  [address]    Dear Eric [name],   At least one of the polyps removed during your recent procedure was proven to be adenomatous.  Given your age, I do not recommend further colonoscopies to check for new polyps.  This assumes that you have no new, concerning GI symptoms.  Please call if you have any questions or concerns.     Sincerely,  Rachael Fee MD  This letter has been electronically signed by your physician.  Appended Document: Results Letter letter mailed.

## 2010-09-09 NOTE — Consult Note (Signed)
Summary: [facility]   Imported By: Maryln Gottron 06/26/2010 16:00:30  _____________________________________________________________________  External Attachment:    Type:   Image     Comment:   External Document

## 2010-09-09 NOTE — Medication Information (Signed)
Summary: rov/tm  Anticoagulant Therapy  Managed by: Cloyde Reams, RN, BSN Referring MD: Valera Castle, MD PCP: Laurita Quint MD Supervising MD: Gala Romney MD, Reuel Boom Indication 1: Atrial Flutter (ICD-427.32) Lab Used: [facility] Site: [facility] INR POC 3.9 INR RANGE 2 - 3  Dietary changes: no    Health status changes: no    Bleeding/hemorrhagic complications: no    Recent/future hospitalizations: no    Any changes in medication regimen? yes       Details: Amoxicillin 500mg  bidx 10 days, 3 days left  Recent/future dental: no  Any missed doses?: no       Is patient compliant with meds? yes       Allergies: 1)  ! * Altace 2)  ! Imdur 3)  ! * Quinidine Gluconate 4)  ! * Reopro  Anticoagulation Management History:      The patient is taking warfarin and comes in today for a routine follow up visit.  Positive risk factors for bleeding include an age of 75 years or older and presence of serious comorbidities.  The bleeding index is 'intermediate risk'.  Positive CHADS2 values include History of HTN, Age > 57 years old, and History of Diabetes.  The start date was 04/21/1999.  Anticoagulation responsible provider: Bensimhon MD, Reuel Boom.  INR POC: 3.9.  Cuvette Lot#: [id number].  Exp: 01/2011.    Anticoagulation Management Assessment/Plan:      The patient's current anticoagulation dose is Coumadin 5 mg tabs: Take as directed by coumadin clinic..  The target INR is 2.0-3.0.  The next INR is due 12/16/2009.  Anticoagulation instructions were given to patient.  Results were reviewed/authorized by Cloyde Reams, RN, BSN.  He was notified by Cloyde Reams RN.         Prior Anticoagulation Instructions: INR 4.0 Skip today's dose then resume 2.5mg s daily except 5mg s on Mondays. Recheck in 2 weeks.   Current Anticoagulation Instructions: INR 3.9  Skip tomorrow's dosage of coumadin, then start taking 1/2 tablet daily.Recheck in 2 weeks.

## 2010-09-09 NOTE — Letter (Signed)
Summary: [facility] Surgical Clearance   [facility] Surgical Clearance   Imported By: Roderic Ovens 12/31/2009 11:52:23  _____________________________________________________________________  External Attachment:    Type:   Image     Comment:   External Document

## 2010-09-09 NOTE — Medication Information (Signed)
Summary: rov/tm  Anticoagulant Therapy  Managed by: Bethena Midget, RN, BSN Referring MD: Valera Castle MD PCP: Laurita Quint MD Supervising MD: Shirlee Latch MD, Pallie Swigert Indication 1: Atrial Flutter (ICD-427.32) Lab Used: [facility] Site: [facility] INR POC 4.0 INR RANGE 2 - 3  Dietary changes: no    Health status changes: no    Bleeding/hemorrhagic complications: no    Recent/future hospitalizations: yes       Details: Pending Colonoscopy in May   Any changes in medication regimen? no    Recent/future dental: no  Any missed doses?: no       Is patient compliant with meds? yes      Comments: Having Stress Test today. Also has on 5/2 Colonoscopy and has been approved to stop on 4/27.  Allergies: 1)  ! * Altace 2)  ! Imdur 3)  ! * Quinidine Gluconate 4)  ! * Reopro  Anticoagulation Management History:      The patient is taking warfarin and comes in today for a routine follow up visit.  Positive risk factors for bleeding include an age of 12 years or older and presence of serious comorbidities.  The bleeding index is 'intermediate risk'.  Positive CHADS2 values include History of HTN, Age > 38 years old, and History of Diabetes.  The start date was 04/21/1999.  Anticoagulation responsible provider: Shirlee Latch MD, Delailah Spieth.  INR POC: 4.0.  Cuvette Lot#: [id number].  Exp: 12/2010.    Anticoagulation Management Assessment/Plan:      The patient's current anticoagulation dose is Coumadin 5 mg tabs: Take as directed by coumadin clinic..  The target INR is 2.0-3.0.  The next INR is due 11/28/2009.  Anticoagulation instructions were given to patient.  Results were reviewed/authorized by Bethena Midget, RN, BSN.  He was notified by Bethena Midget, RN, BSN.         Prior Anticoagulation Instructions: INR 2.7  Continue on same dosage 1/2 tablet daily except 1 tbalet on Mondays.  Recheck in 4 weeks.    Current Anticoagulation Instructions: INR 4.0 Skip today's dose then resume 2.5mg s daily except  5mg s on Mondays. Recheck in 2 weeks.

## 2010-09-09 NOTE — Assessment & Plan Note (Signed)
Summary: SINUS SYMPTOMS/ 12:00   lb   Vital Signs:  Patient profile:   77 year old male Height:      66.5 inches Weight:      182.75 pounds BMI:     29.16 Temp:     97.6 degrees F oral Pulse rate:   66 / minute Pulse rhythm:   regular BP sitting:   130 / 92  (left arm) Cuff size:   large CC: congestion, ears, cough   History of Present Illness: CC: congestion, ears, cough  4-5 d h/o stuffed up sinuses, tends to have trouble with them sometimes.  + spitting up white thick sputum.  + coughing.  + ear pain L>R.  + sinus pressure.  ST but better today.  Feels like coughing up fine.  Main bother is nighttime congestion, awakens because so stopped up.  Robitussin helping cough.    No fevers/chills, abd pain, n/v, rashes, myalgias, arthralgias.  + wife sick as well, told had fluid in ears.  h/o CAD, afib, DM, HTN.  Allergies: 1)  ! * Altace 2)  ! Imdur 3)  ! * Quinidine Gluconate 4)  ! * Reopro  Past History:  Past Medical History: Last updated: 12/25/2008 CORONARY ARTERY DISEASE, S/P CABG 2000 (ICD-414.00) ATRIAL FIBRILLATION (ICD-427.31) ATRIAL FLUTTER (ICD-427.32) COUMADIN THERAPY, SEC. A-FIB (ICD-V58.61) HYPERTRIGLYCERIDEMIA (ICD-272.1) HYPERTENSION (ICD-401.9) DIABETES MELLITUS, TYPE II (ICD-250.00) BRONCHITIS- ACUTE (ICD-466.0) SPECIAL SCREENING MALIGNANT NEOPLASM OF PROSTATE (ICD-V76.44)    Social History: Reviewed history from 09/18/2008 and no changes required. 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 Head:  Normocephalic and atraumatic without obvious abnormalities. No apparent alopecia or balding. Eyes:  Conjunctiva clear bilaterally. Ears:  hearing aids.  L ear with dry cerumen  covering most of ear canal,  able to clear enough to get view of TM, pearly greay. R TM clear Nose:  External nasal examination shows no deformity or inflammation. Nasal mucosa are pink and moist without lesions or exudates. congestion Mouth:  Oral mucosa and oropharynx without lesions or exudates.  Teeth in good repair.  some PND Neck:  No deformities, masses, or tenderness noted. Lungs:  Normal respiratory effort, chest expands symmetrically. Lungs are clear to auscultation, no crackles or wheezes.  Heart:  Normal rate and regular rhythm. S1 and S2 normal without gallop, murmur, click, rub or other extra sounds. Pulses:  2+ rad pulses Extremities:  no edema.  R hand in flexible cast Skin:  Intact without suspicious lesions or rashes   Impression & Recommendations:  Problem # 1:  VIRAL URI (ICD-465.9) Instructed on symptomatic treatment. Call if symptoms persist or worsen.   given weekend and age/comorbidities, Azithro script to hold on to.  His updated medication list for this problem includes:    Aspir-low 81 Mg Tbec (Aspirin) .[name]... 1  daily by mouth    Tylenol Extra Strength 500 Mg Tabs (Acetaminophen) .[name]... 2 tablets by mouth as needed pain  Complete Medication List: 1)  Metoprolol Tartrate 100 Mg Tabs (Metoprolol tartrate) .[name].. 1 tab by mouth two times a day 2)  Lisinopril 5 Mg Tabs (Lisinopril) .... Take one tablet by mouth in the morning and 1 tablet at bedtime 3)  Aspir-low 81 Mg Tbec (Aspirin) .[name].. 1  daily by  mouth 4)  Fish Oil 1000 Mg Caps (Omega-3 fatty acids) .[name].. 1tablet twice a day by mouth 5)  Daily Multiple Vitamins Tabs (Multiple vitamin) .[name].. 1 tablet daily by mouth 6)  Metformin Hcl 500 Mg Tabs (Metformin hcl) .... One tab by mouth two times a day 7)  Tylenol Extra Strength 500 Mg Tabs (Acetaminophen) .... 2 tablets by mouth as needed pain 8)  Hydrochlorothiazide 12.5 Mg Tabs (Hydrochlorothiazide) .[name].. 1 daily by mouth 9)  Fenofibrate 160 Mg Tabs (Fenofibrate) ....  One tab by mouth once daily 10)  Coumadin 5 Mg Tabs (Warfarin sodium) .... Take as directed by coumadin clinic. 11)  Simvastatin 40 Mg Tabs (Simvastatin) .... Take one tablet by mouth daily at bedtime 12)  B Complex-b12 Tabs (B complex vitamins) .[name].. 1000 micrograms (1 tablet) daily. 13)  Vitamin D 1000 Unit Tabs (Cholecalciferol) .... Take twice a week 14)  Zithromax Z-pak 250 Mg Tabs (Azithromycin) .... Use as directed  Patient Instructions: 1)  Make sure robitussin is not decongestant (no pseudophed or phenylephrine) 2)  Sounds like you have a viral upper respiratory infection. 3)  Antibiotics are not needed for this.  Viral infections usually take 7-10 days to resolve.  The cough can last up to 4 weeks to go away. 4)  use guaifenesin 400mg  IR 1 pill in am and at noon with plenty of fluid to mobilize mucous out. 5)  Use nasal saline spray or neti pot to drain sinuses 6)  Please return if you are not improving as expected, or if you have high fevers (>101.5) or difficulty swallowing. 7)  Call clinic with questions.  Pleasure to see you today!  Prescriptions: ZITHROMAX Z-PAK 250 MG TABS (AZITHROMYCIN) use as directed  #1 x 0   Entered and Authorized by:   Eustaquio Boyden  MD   Signed by:   Eustaquio Boyden  MD on 07/18/2010   Method used:   Print then Give to Patient   RxID:   [id number]    Orders Added: 1)  Est. Patient Level III [14782]    Current Allergies (reviewed today): ! * ALTACE ! IMDUR ! * QUINIDINE GLUCONATE ! * REOPRO

## 2010-09-09 NOTE — Assessment & Plan Note (Signed)
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  History of Present Illness: Mr. Crisostomo returns today for evaluation and management of his complex cardiovascular issues.  He was pumping gas last Friday and tripped over the hose struck his head and arm. He headache that day but did not get it checked out. He remains on Coumadin. He has had no sleepiness, headache, or visual changes sense.  He also notes his systolic blood pressures over 324 checks after 10:00 at night. He doesn't take his p.m. dosages of metoprolol her lisinopril until bedtime which is around 10. He takes his morning doses around 7 to 7:30.  Denies any chest pain, orthopnea, palpitations, syncope.  Clinical Reports Reviewed:  Nuclear Study:  11/14/2009:  Excerise capacity: Lexiscan study  Blood Pressure response: Normal blood pressure response  Clinical symptoms: Throat tickling  ECG impression: Baseline atrial fibrillation, no significant change with Lexiscan  Overall impression: Normal stress nuclear study.  Marca Ancona, MD   02/18/2007:  Exercise capacity - [place]ne study with no exercise  Blood Pressure -  Clinical Symptoms -   ECG impression - No significant ST segment change suggestive of ischemia; patient in atrial fibrillation during the study  Overall impression -  Therre is no sign of scar or ischemia  10/22/2004:  Normal adenosine Myoview. Ejection fraction 56%. Baseline rhythm was atrial fibrillation.   Current Medications (verified): 1)  Metoprolol Tartrate 100 Mg Tabs (Metoprolol Tartrate) .[name].. 1 Tab By Mouth Two Times A Day 2)  Lisinopril 5 Mg Tabs (Lisinopril)  .... Take One Tablet By Mouth in The Morning and 1/2 Tablet in The Evening. 3)  Aspir-Low 81 Mg Tbec (Aspirin) .[name].. 1  Daily By Mouth 4)  Fish Oil 1000 Mg Caps (Omega-3 Fatty Acids) .[name].. 1tablet Twice A Day By Mouth 5)  Daily Multiple Vitamins  Tabs (Multiple Vitamin) .[name].. 1 Tablet Daily By Mouth 6)  Metformin Hcl 500 Mg  Tabs (Metformin Hcl) .... One Tab By Mouth Two Times A Day 7)  Tylenol Extra Strength 500 Mg  Tabs (Acetaminophen) .... 2 Tablets By Mouth As Needed Pain 8)  Hydrochlorothiazide 12.5 Mg  Tabs (Hydrochlorothiazide) .[name].. 1 Daily By Mouth 9)  Fenofibrate 160 Mg Tabs (Fenofibrate) .... One Tab By Mouth Once Daily 10)  Coumadin 5 Mg Tabs (Warfarin Sodium) .... Take As Directed By Coumadin Clinic. 11)  Simvastatin 40 Mg Tabs (Simvastatin) .... Take One Tablet By Mouth Daily At Bedtime 12)  B Complex-B12  Tabs (B Complex Vitamins) .[name].. 1000 Micrograms (1 Tablet) Daily. 13)  Vitamin D 1000 Unit Tabs (Cholecalciferol) .... Take Twice A Week  Allergies: 1)  ! * Altace 2)  ! Imdur 3)  ! * Quinidine Gluconate 4)  ! * Reopro  Past History:  Past Medical History: Last updated: 12/25/2008 CORONARY ARTERY DISEASE, S/P CABG 2000 (ICD-414.00) ATRIAL FIBRILLATION (ICD-427.31) ATRIAL FLUTTER (ICD-427.32) COUMADIN THERAPY, SEC. A-FIB (ICD-V58.61) HYPERTRIGLYCERIDEMIA (ICD-272.1) HYPERTENSION (ICD-401.9) DIABETES MELLITUS, TYPE II (ICD-250.00) BRONCHITIS- ACUTE (ICD-466.0) SPECIAL SCREENING MALIGNANT NEOPLASM OF PROSTATE (ICD-V76.44)    Past Surgical History: Last updated: 03/03/2007 Angioplasty, CAD- diagonal  with rotational atherectomy 01/99 Carpel tunnel (?)  bilateral Cath (cardiac ) angioplasty 01/00 Arthrectomy of LAD & PTCA CABG, CAD 2000 Admit unstable angina 04/03 PTCA Cardioversion 01/04 Hospital- a flutter/ cardioversion 05/07 Adenosine Myoview EF 56%, neg. Ischemia 03/06 Colonoscopy/ sigmoid polyps Bx. negative 10/01/06 (3 yrs) Adenosine Myoview nml  02/18/07  Family History: Last updated: 09/25/2009 Fatrher dec 59 CVA Aneurysm Htn Mother dec 85 Htn Brother dec CABG Prostate Ca  Brother dec Lung Ca (smoker) blood clot Brother dec RF Valve disorder (smoker) Brother dec Lung Ca (smoker) Sister dec  76 Cerebral hemm  Sister A 80  Sister dec Breast Ca   Social History: Last updated: 09/18/2008 Occupation: [employer] Retired   Now does Air traffic controller Married 5 Chidren Former Smoker Alcohol use-no Drug use-no  Risk Factors: Alcohol Use: 0 (09/25/2009) Caffeine Use: 2 (09/25/2009) Exercise: yes (09/25/2009)  Risk Factors: Smoking Status: never (09/25/2009) Passive Smoke Exposure: no (09/25/2009)  Review of Systems       negative other than history of present illness  Vital Signs:  Patient profile:   77 year old male Height:      66.5 inches Weight:      180.8 pounds BMI:     28.85 Pulse rate:   75 / minute Pulse rhythm:   irregular BP sitting:   150 / 72  (left arm) Cuff size:   large  Vitals Entered By: Danielle Rankin, CMA (May 21, 2010 3:14 PM)  Physical Exam  General:  obese.   Head:  normocephalic and atraumatic Eyes:  residual black eye on the right Neck:  Neck supple, no JVD. No masses, thyromegaly or abnormal cervical nodes. Chest Wall:  no deformities or breast masses noted Lungs:  Clear bilaterally to auscultation and percussion. Heart:  PMI nondisplaced, regular rate and rhythm, no obvious murmur, carotid upstrokes equal bilaterally without bruits Msk:  decreased ROM.   Pulses:  pulses normal in all 4 extremities Extremities:  No clubbing or cyanosis. Neurologic:  Alert and oriented x 3. Skin:  Intact without lesions or rashes. Psych:  Normal affect.   EKG  Procedure date:  05/21/2010  Findings:      particular relation with an occasional PVC, nonspecific changes,  Impression & Recommendations:  Problem # 1:  ATRIAL FIBRILLATION (ICD-427.31) Assessment Unchanged  His updated  medication list for this problem includes:    Metoprolol Tartrate 100 Mg Tabs (Metoprolol tartrate) .[name]... 1 tab by mouth two times a day    Aspir-low 81 Mg Tbec (Aspirin) .[name]... 1  daily by mouth    Coumadin 5 Mg Tabs (Warfarin sodium) .[name]... Take as directed by coumadin clinic.  Orders: EKG w/ Interpretation (93000)  Problem # 2:  ATRIAL FLUTTER (ICD-427.32) Assessment: Improved  His updated medication list for this problem includes:    Metoprolol Tartrate 100 Mg Tabs (Metoprolol tartrate) .[name]... 1 tab by mouth two times a day    Aspir-low 81 Mg Tbec (Aspirin) .[name]... 1  daily by mouth    Coumadin 5 Mg Tabs (Warfarin sodium) .[name]... Take as directed by coumadin clinic.  Problem # 3:  COUMADIN THERAPY, SEC. A-FIB (ICD-V58.61) Assessment: Unchanged He has been advised to have an immediate checkup if he hits his head again.  Problem # 4:  HYPERTENSION (ICD-401.9) He has been taking his medicines on her regular schedule particulars twice a day meds. His blood pressures been very high at night as noted. I have increased his lisinopril to 5 mg twice a day. He'll continue with metoprolol at the current dose and  HCTZ. He has had problems with low blood pressure the past where we were to aggressive. He will continue to monitor with blood pressure goal of less than 150 systolic over less than 90 diastolic. His updated medication list for this problem includes:    Metoprolol Tartrate 100 Mg Tabs (Metoprolol tartrate) .[name]... 1 tab by mouth two times a day    Lisinopril 5 Mg Tabs (Lisinopril) .[name]... Take one tablet by mouth in the morning and 1 tablet at bedtime    Aspir-low 81 Mg Tbec (Aspirin) .[name]... 1  daily by mouth    Hydrochlorothiazide 12.5 Mg Tabs (Hydrochlorothiazide) .[name]... 1 daily by mouth  Patient Instructions: 1)  Your physician recommends that you schedule a follow-up appointment in: 3 months with Dr. Daleen Squibb 2)  Your physician has recommended you make the following change in your medication:  3)   Your physician has requested that you regularly monitor and record your blood pressure readings at home.  Please use the same machine at the same time of day to check your readings and record them to bring to your follow-up visit.  Goal bp less than 150/90. 4)  If you should fall and/or hit your head again it is important that you have an immediate check up as you are on coumadin

## 2010-09-09 NOTE — Progress Notes (Signed)
Summary: regarding form for diabetic shoes  Phone Note Other Incoming   Caller: [employer] Summary of Call: Form was faxed last month to [facility] for diabetic shoes.  They said they didnt get this form and they are also asking for last office note.  Is it ok to fax the CPX notes from 2/16? Initial call taken by: Lowella Petties CMA,  October 15, 2009 3:57 PM  Follow-up for Phone Call        Yes. Follow-up by: Shaune Leeks MD,  October 15, 2009 4:59 PM  Additional Follow-up for Phone Call Additional follow up Details #1::        Notes and form faxed. Additional Follow-up by: Lowella Petties CMA,  October 15, 2009 5:12 PM

## 2010-09-09 NOTE — Assessment & Plan Note (Signed)
Summary: per check out/sf  Medications Added SIMVASTATIN 40 MG TABS (SIMVASTATIN) Take one tablet by mouth daily at bedtime      Allergies Added:   Visit Type:  Follow-up Referring Provider:  n/a Primary Provider:  Laurita Quint MD  CC:  no complaints.  History of Present Illness: Eric [name] comes in today for evaluation and management his coronary disease and history of atrial for ablation.  He is currently doing well except for some mild dyspnea on exertion. He's had no angina per se. His last stress Myoview was over 2-1/2 years ago. Please see the last report below.  He's had no palpitations, orthopnea, PND, syncope. His blood pressure done under good control.  Recent blood work by his primary care I reviewed in detail today. His lipids are not at goal. He is on simvastatin and fenofibrate.  Current Medications (verified): 1)  Metoprolol Tartrate 100 Mg Tabs (Metoprolol Tartrate) .... 1/2 Tab Every Am..1 Whole Tab At Bedtime 2)  Lisinopril 5 Mg Tabs (Lisinopril) .... Take One Tablet By Mouth in The Morning and 1/2 Tablet in The Evening. 3)  Aspir-Low 81 Mg Tbec (Aspirin) .[name].. 1  Daily By Mouth 4)  Fish Oil 1000 Mg Caps (Omega-3 Fatty Acids) .[name].. 1tablet Twice A Day By Mouth 5)  Daily Multiple Vitamins  Tabs (Multiple Vitamin) .[name].. 1 Tablet Daily By Mouth 6)  Metformin Hcl 500 Mg  Tabs (Metformin Hcl) .... One Tab By Mouth Two Times A Day 7)  Tylenol Extra Strength 500 Mg  Tabs (Acetaminophen) .... 2 Tablets By Mouth As Needed Pain 8)  Hydrochlorothiazide 12.5 Mg  Tabs (Hydrochlorothiazide) .[name].. 1 Daily By Mouth 9)  Fenofibrate 160 Mg Tabs (Fenofibrate) .... One Tab By Mouth Once Daily 10)  Coumadin 5 Mg Tabs (Warfarin Sodium) .... Take As Directed By Coumadin Clinic. 11)  Simvastatin 40 Mg Tabs (Simvastatin) .... Take One Tablet By Mouth Daily At Bedtime 12)  B Complex-B12  Tabs (B Complex Vitamins) .[name].. 1000 Micrograms (1 Tablet) Daily. 13)  Vitamin D 1000 Unit Tabs  (Cholecalciferol) .... Take Twice A Week  Allergies (verified): 1)  ! * Altace 2)  ! Imdur 3)  ! * Quinidine Gluconate 4)  ! * Reopro  Past History:  Past Medical History: Last updated: 12/25/2008 CORONARY ARTERY DISEASE, S/P CABG 2000 (ICD-414.00) ATRIAL FIBRILLATION (ICD-427.31) ATRIAL FLUTTER (ICD-427.32) COUMADIN THERAPY, SEC. A-FIB (ICD-V58.61) HYPERTRIGLYCERIDEMIA (ICD-272.1) HYPERTENSION (ICD-401.9) DIABETES MELLITUS, TYPE II (ICD-250.00) BRONCHITIS- ACUTE (ICD-466.0) SPECIAL SCREENING MALIGNANT NEOPLASM OF PROSTATE (ICD-V76.44)    Past Surgical History: Last updated: 03/03/2007 Angioplasty, CAD- diagonal  with rotational atherectomy 01/99 Carpel tunnel (?) bilateral Cath (cardiac ) angioplasty 01/00 Arthrectomy of LAD & PTCA CABG, CAD 2000 Admit unstable angina 04/03 PTCA Cardioversion 01/04 Hospital- a flutter/ cardioversion 05/07 Adenosine Myoview EF 56%, neg. Ischemia 03/06 Colonoscopy/ sigmoid polyps Bx. 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 5 Chidren Former Smoker Alcohol use-no Drug use-no  Risk Factors: Alcohol Use: 0 (09/25/2009) Caffeine Use: 2 (09/25/2009) Exercise: yes (09/25/2009)  Risk Factors: Smoking Status: never (09/25/2009) Passive Smoke Exposure: no (  09/25/2009)  Review of Systems       negative other than history of present illness  Vital Signs:  Patient profile:   77 year old male Height:      65 inches Weight:      185 pounds BMI:     30.90 Pulse rate:   75 / minute BP sitting:   146 / 74  (left  arm) Cuff size:   regular  Vitals Entered By: Hardin Negus, RMA (November 08, 2009 3:48 PM)  Physical Exam  General:  Well developed, well nourished, in no acute distress. Head:  normocephalic and atraumatic Eyes:  PERRLA/EOM intact; conjunctiva and lids normal. Neck:  Neck supple, no JVD. No masses, thyromegaly or abnormal cervical nodes. Chest Sumayya Muha:  no deformities or breast masses noted Lungs:  Clear bilaterally to auscultation and percussion. Heart:  Non-displaced PMI, chest non-tender; regular rate and rhythm, S1, S2 without murmurs, rubs or gallops. Carotid upstroke normal, no bruit. Normal abdominal aortic size, no bruits. Femorals normal pulses, no bruits. Pedals normal pulses. No edema, no varicosities. Msk:  decreased ROM.  decreased ROM.   Pulses:  pulses normal in all 4 extremities Extremities:  trace left pedal edema and trace right pedal edema.  trace left pedal edema.   Neurologic:  Alert and oriented x 3. Skin:  Intact without lesions or rashes. Psych:  Normal affect.   Impression & Recommendations:  Problem # 1:  CORONARY ARTERY DISEASE, S/P CABG 2000 (ICD-414.00) I have recommended a followup Myoview. His last objective assessment was 2-1/2 years ago. This is negative, we'll see him back again in 6 months. His updated medication list for this problem includes:    Metoprolol Tartrate 100 Mg Tabs (Metoprolol tartrate) .[name]... 1/2 tab every am..1 whole tab at bedtime    Lisinopril 5 Mg Tabs (Lisinopril) .[name]... Take one tablet by mouth in the morning and 1/2 tablet in the evening.    Aspir-low 81 Mg Tbec (Aspirin) .[name]... 1  daily by mouth    Coumadin 5 Mg Tabs (Warfarin sodium) .[name]... Take as directed by coumadin clinic.  Orders: Nuclear Stress Test (Nuc Stress Test)  Problem # 2:  ATRIAL FIBRILLATION (ICD-427.31) Assessment: Unchanged  His updated medication list for this problem includes:    Metoprolol Tartrate 100 Mg Tabs (Metoprolol tartrate) .[name]... 1/2 tab every  am..1 whole tab at bedtime    Aspir-low 81 Mg Tbec (Aspirin) .[name]... 1  daily by mouth    Coumadin 5 Mg Tabs (Warfarin sodium) .[name]... Take as directed by coumadin clinic.  Problem # 3:  ATRIAL FLUTTER (ICD-427.32) Assessment: Improved  His updated medication list for this problem includes:    Metoprolol Tartrate 100 Mg Tabs (Metoprolol tartrate) .[name]... 1/2 tab every am..1 whole tab at bedtime    Aspir-low 81 Mg Tbec (Aspirin) .[name]... 1  daily by mouth    Coumadin 5 Mg Tabs (Warfarin sodium) .[name]... Take as directed by coumadin clinic.  Problem # 4:  COUMADIN THERAPY, SEC. A-FIB (ICD-V58.61) Assessment: Unchanged  Problem # 5:  HYPERTENSION (ICD-401.9) Assessment: Unchanged  His updated medication list for this problem includes:    Metoprolol Tartrate 100 Mg Tabs (Metoprolol tartrate) .[name]... 1/2 tab every am..1 whole tab at bedtime    Lisinopril 5 Mg Tabs (Lisinopril) .[name]... Take one tablet by mouth in the morning and 1/2 tablet in the evening.    Aspir-low 81 Mg Tbec (Aspirin) .[name]... 1  daily by mouth    Hydrochlorothiazide 12.5 Mg Tabs (Hydrochlorothiazide) .Marland KitchenMarland KitchenMarland KitchenMarland Kitchen  1 daily by mouth  Problem # 6:  HYPERTRIGLYCERIDEMIA (ICD-272.1)  His updated medication list for this problem includes:    Fenofibrate 160 Mg Tabs (Fenofibrate) ..... One tab by mouth once daily    Simvastatin 40 Mg Tabs (Simvastatin) .[name]... Take one tablet by mouth daily at bedtime  His updated medication list for this problem includes:    Fenofibrate 160 Mg Tabs (Fenofibrate) ..... One tab by mouth once daily    Simvastatin 40 Mg Tabs (Simvastatin) .[name]... Take one tablet by mouth daily at bedtime  Problem # 7:  DIABETES MELLITUS, TYPE II (ICD-250.00) Assessment: Improved  His updated medication list for this problem includes:    Lisinopril 5 Mg Tabs (Lisinopril) .[name]... Take one tablet by mouth in the morning and 1/2 tablet in the evening.    Aspir-low 81 Mg Tbec (Aspirin) .[name]... 1  daily by mouth    Metformin Hcl 500 Mg Tabs  (Metformin hcl) ..... One tab by mouth two times a day  His updated medication list for this problem includes:    Lisinopril 5 Mg Tabs (Lisinopril) .[name]... Take one tablet by mouth in the morning and 1/2 tablet in the evening.    Aspir-low 81 Mg Tbec (Aspirin) .[name]... 1  daily by mouth    Metformin Hcl 500 Mg Tabs (Metformin hcl) ..... One tab by mouth two times a day  Clinical Reports Reviewed:  Cardiac Cath:  12/05/2001: Cardiac Cath Findings:  CONCLUSIONS: Successful percutaneous Cutting Balloon angioplasty of the left anterior descending artery.  DISPOSITION: The patient will be treated medically. Followup will be with Dr. Daleen Squibb. Dictated by:   Arturo Morton Riley Kill, M.D. [facility] Attending Physician:  Mirian Mo  12/02/2001: Cardiac Cath Findings:  CONCLUSIONS: 1. Preserved overall left ventricular function with mild mitral regurgitation. 2. No evidence of aortic dissection with evidence of occluded vein grafts. 3. Occlusion of the saphenous vein graft to the diagonal. 4. Occlusion of the saphenous vein graft to the nondominant right. 5. Patent internal mammary to the distal left anterior descending artery with    probable ostial spasm but 50% ostial stenosis cannot be excluded.  DISPOSITION:  I plan to review with my colleagues.  My leaning is in the direction of considering percutaneous intervention of the LAD leading into the diagonal as this is the only remaining vessel off of the LAD proper.  We would like to attempt this with a drug-eluting stent.  ADDENDUM:  Aortic root aortography was also performed. Dictated by:   Arturo Morton Riley Kill, M.D. [facility] Attending Physician:  Mirian Mo  Nuclear Study:  02/18/2007:  Exercise capacity - [place]ne study with no exercise  Blood Pressure -  Clinical Symptoms -   ECG impression - No significant ST segment change suggestive of ischemia; patient in atrial fibrillation during the study  Overall impression -  Therre is no  sign of scar or ischemia  10/22/2004:  Normal adenosine Myoview. Ejection fraction 56%. Baseline rhythm was atrial fibrillation.   Patient Instructions: 1)  Your physician recommends that you schedule a follow-up appointment in: 6 MONTHS WITH DR Theda Payer 2)  Your physician recommends that you continue on your current medications as directed. Please refer to the Current Medication list given to you today. 3)  Your physician has requested that you have an exercise stress myoview.  For further information please visit [email or web address]  Please follow instruction sheet, as given. Prescriptions: HYDROCHLOROTHIAZIDE 12.5 MG  TABS (HYDROCHLOROTHIAZIDE) 1 DAILY BY MOUTH  #90 x 3  Entered by:   Scherrie Bateman, LPN   Authorized by:   Gaylord Shih, MD, [facility]   Signed by:   Scherrie Bateman, LPN on 16/05/9603   Method used:   Electronically to        [facility] [address] (retail)       [address], [address]       [place], [place]  [address]       Ph: [phone]       Fax: [phone]   RxID:   [id number]

## 2010-09-09 NOTE — Medication Information (Signed)
Summary: ROV/SP   Anticoagulant Therapy  Managed by: Reina Fuse, PharmD Referring MD: Valera Castle, MD PCP: Laurita Quint MD Supervising MD: Gala Romney MD, Reuel Boom Indication 1: Atrial Flutter (ICD-427.32) Lab Used: [facility] Site: [facility] INR POC 2.4 INR RANGE 2 - 3  Dietary changes: no    Health status changes: no    Bleeding/hemorrhagic complications: no    Recent/future hospitalizations: no    Any changes in medication regimen? no    Recent/future dental: no  Any missed doses?: no       Is patient compliant with meds? yes       Allergies: 1)  ! * Altace 2)  ! Imdur 3)  ! * Quinidine Gluconate 4)  ! * Reopro  Anticoagulation Management History:      The patient is taking warfarin and comes in today for a routine follow up visit.  Positive risk factors for bleeding include an age of 15 years or older and presence of serious comorbidities.  The bleeding index is 'intermediate risk'.  Positive CHADS2 values include History of HTN, Age > 49 years old, and History of Diabetes.  The start date was 04/21/1999.  Anticoagulation responsible Alayne Estrella: Bensimhon MD, Reuel Boom.  INR POC: 2.4.  Cuvette Lot#: [id number].  Exp: 06/2011.    Anticoagulation Management Assessment/Plan:      The patient's current anticoagulation dose is Coumadin 5 mg tabs: Take as directed by coumadin clinic..  The target INR is 2.0-3.0.  The next INR is due 07/30/2010.  Anticoagulation instructions were given to patient.  Results were reviewed/authorized by Reina Fuse, PharmD.  He was notified by Reina Fuse PharmD.         Prior Anticoagulation Instructions: INR 2.6  Continue taking same dose of 1/2 tablet everyday. Recheck in 4 weeks.   Current Anticoagulation Instructions: INR 2.4  Continue taking Coumadin 0.5 tab (2.5 mg) every day. Return to clinic in 4 weeks.

## 2010-09-09 NOTE — Medication Information (Signed)
Summary: ccr/mt  Anticoagulant Therapy  Managed by: Bethena Midget, RN, BSN Referring MD: Valera Castle, MD PCP: Laurita Quint MD Supervising MD: Shirlee Latch MD, Payson Evrard Indication 1: Atrial Flutter (ICD-427.32) Lab Used: [facility] Site: [facility] INR POC 2.5 INR RANGE 2 - 3  Dietary changes: no    Health status changes: no    Bleeding/hemorrhagic complications: no    Recent/future hospitalizations: no    Any changes in medication regimen? no    Recent/future dental: no  Any missed doses?: no       Is patient compliant with meds? yes       Allergies: 1)  ! * Altace 2)  ! Imdur 3)  ! * Quinidine Gluconate 4)  ! * Reopro  Anticoagulation Management History:      The patient is taking warfarin and comes in today for a routine follow up visit.  Positive risk factors for bleeding include an age of 75 years or older and presence of serious comorbidities.  The bleeding index is 'intermediate risk'.  Positive CHADS2 values include History of HTN, Age > 73 years old, and History of Diabetes.  The start date was 04/21/1999.  Anticoagulation responsible provider: Shirlee Latch MD, Mayela Bullard.  INR POC: 2.5.  Cuvette Lot#: [id number].  Exp: 04/2011.    Anticoagulation Management Assessment/Plan:      The patient's current anticoagulation dose is Coumadin 5 mg tabs: Take as directed by coumadin clinic..  The target INR is 2.0-3.0.  The next INR is due 02/17/2010.  Anticoagulation instructions were given to patient.  Results were reviewed/authorized by Bethena Midget, RN, BSN.  He was notified by Bethena Midget, RN, BSN.         Prior Anticoagulation Instructions: INR 1.6. Take 1 tablet today, then continue 0.5 tablet daily. Recheck in 10 days.  Current Anticoagulation Instructions: INR 2.5 Continue 1/2 pill everyday. Recheck in 3 weeks.

## 2010-09-09 NOTE — Assessment & Plan Note (Signed)
Summary: COUGH AND CONGESTION/ 2:15  -lb   Vital Signs:  Patient profile:   77 year old male Weight:      184.12 pounds O2 Sat:      97 % on Room air Temp:     98.5 degrees F oral Pulse rate:   72 / minute Pulse rhythm:   irregular BP sitting:   122 / 78  (left arm) Cuff size:   regular  Vitals Entered By: Sydell Axon LPN (November 21, 2009 2:01 PM)  O2 Flow:  Room air CC: Productive cough/white, head and chest congestion, ears are sore   History of Present Illness: Pt here for acute appt for cough and congestion. He is not sure if the pollen is bothering him. He had trouble last year with Last year he had bronchitis and was treatred with Zithromax. He is not quite as bad as last year. He has not had fever (does not check at home), he has minimal to no headache, throat is raw and is sore behind his eardrums, rhinitis that is clear, cough productive white thick mucous. He is minimally SOB, no N/V.  He has not taken anything except tyl once 2 days ago.  Problems Prior to Update: 1)  Diarrhea  (ICD-787.91) 2)  Personal History of Colonic Polyps  (ICD-V12.72) 3)  Vitamin B12 Deficiency  (ICD-266.2) 4)  Coronary Artery Disease, S/p Cabg 2000  (ICD-414.00) 5)  Atrial Fibrillation  (ICD-427.31) 6)  Atrial Flutter  (ICD-427.32) 7)  Coumadin Therapy, Sec. A-fib  (ICD-V58.61) 8)  Hypertriglyceridemia  (ICD-272.1) 9)  Hypertension  (ICD-401.9) 10)  Diabetes Mellitus, Type II  (ICD-250.00) 11)  Special Screening Malignant Neoplasm of Prostate  (ICD-V76.44)  Medications Prior to Update: 1)  Metoprolol Tartrate 100 Mg Tabs (Metoprolol Tartrate) .... 1/2 Tab Every Am..1 Whole Tab At Bedtime 2)  Lisinopril 5 Mg Tabs (Lisinopril) .... Take One Tablet By Mouth in The Morning and 1/2 Tablet in The Evening. 3)  Aspir-Low 81 Mg Tbec (Aspirin) .[name].. 1  Daily By Mouth 4)  Fish Oil 1000 Mg Caps (Omega-3 Fatty Acids) .[name].. 1tablet Twice A Day By Mouth 5)  Daily Multiple Vitamins  Tabs (Multiple Vitamin)  .[name].. 1 Tablet Daily By Mouth 6)  Metformin Hcl 500 Mg  Tabs (Metformin Hcl) .... One Tab By Mouth Two Times A Day 7)  Tylenol Extra Strength 500 Mg  Tabs (Acetaminophen) .... 2 Tablets By Mouth As Needed Pain 8)  Hydrochlorothiazide 12.5 Mg  Tabs (Hydrochlorothiazide) .[name].. 1 Daily By Mouth 9)  Fenofibrate 160 Mg Tabs (Fenofibrate) .... One Tab By Mouth Once Daily 10)  Coumadin 5 Mg Tabs (Warfarin Sodium) .... Take As Directed By Coumadin Clinic. 11)  Simvastatin 40 Mg Tabs (Simvastatin) .... Take One Tablet By Mouth Daily At Bedtime 12)  B Complex-B12  Tabs (B Complex Vitamins) .[name].. 1000 Micrograms (1 Tablet) Daily. 13)  Vitamin D 1000 Unit Tabs (Cholecalciferol) .... Take Twice A Week  Allergies: 1)  ! * Altace 2)  ! Imdur 3)  ! * Quinidine Gluconate 4)  ! * Reopro  Physical Exam  General:  Well-developed,well-nourished,in no acute distress; alert,appropriate and cooperative throughout examination, mildly obese slightly congested. Head:  Normocephalic and atraumatic without obvious abnormalities. No apparent alopecia but mild  balding. Sinuses min  tender max sinuses  Eyes:  Conjunctiva clear bilaterally. Ears:  External ear exam shows no significant lesions or deformities.  Otoscopic examination reveals clear canals, tympanic membranes are intact bilaterally without bulging, retraction, inflammation or discharge.  Hearing is grossly normal bilaterally. Hearing aids in place bilat, remotely controlled. Nose:  External nasal examination shows no deformity or inflammation. Nasal mucosa are pink and moist without lesions or exudates. Minimal congestion with clear discharge. Mouth:  Oral mucosa and oropharynx without lesions or exudates.  Teeth in good repair. Mild thick white PND. Neck:  No deformities, masses, or tenderness noted. Chest Wall:  No deformities, masses, tenderness or gynecomastia noted. Lungs:  Normal respiratory effort, chest expands symmetrically. Lungs are clear to  auscultation, no crackles or wheezes. Sligjht anterior crackles. Heart:  Normal rate and regular rhythm. S1 and S2 normal without gallop, murmur, click, rub or other extra sounds.   Impression & Recommendations:  Problem # 1:  URI (ICD-465.9) Assessment New  See instructions. His updated medication list for this problem includes:    Aspir-low 81 Mg Tbec (Aspirin) .[name]... 1  daily by mouth    Tylenol Extra Strength 500 Mg Tabs (Acetaminophen) .[name]... 2 tablets by mouth as needed pain  Instructed on symptomatic treatment. Call if symptoms persist or worsen.   Orders: Prescription Created Electronically [id number])  Complete Medication List: 1)  Metoprolol Tartrate 100 Mg Tabs (Metoprolol tartrate) .... 1/2 tab every am..1 whole tab at bedtime 2)  Lisinopril 5 Mg Tabs (Lisinopril) .... Take one tablet by mouth in the morning and 1/2 tablet in the evening. 3)  Aspir-low 81 Mg Tbec (Aspirin) .[name].. 1  daily by mouth 4)  Fish Oil 1000 Mg Caps (Omega-3 fatty acids) .[name].. 1tablet twice a day by mouth 5)  Daily Multiple Vitamins Tabs (Multiple vitamin) .[name].. 1 tablet daily by mouth 6)  Metformin Hcl 500 Mg Tabs (Metformin hcl) .... One tab by mouth two times a day 7)  Tylenol Extra Strength 500 Mg Tabs (Acetaminophen) .... 2 tablets by mouth as needed pain 8)  Hydrochlorothiazide 12.5 Mg Tabs (Hydrochlorothiazide) .[name].. 1 daily by mouth 9)  Fenofibrate 160 Mg Tabs (Fenofibrate) .... One tab by mouth once daily 10)  Coumadin 5 Mg Tabs (Warfarin sodium) .... Take as directed by coumadin clinic. 11)  Simvastatin 40 Mg Tabs (Simvastatin) .... Take one tablet by mouth daily at bedtime 12)  B Complex-b12 Tabs (B complex vitamins) .[name].. 1000 micrograms (1 tablet) daily. 13)  Vitamin D 1000 Unit Tabs (Cholecalciferol) .... Take twice a week 14)  Trimox 500 Mg Caps (Amoxicillin) .... 2 tabs by mouth two times a day  Patient Instructions: 1)  Take Amox. 2)  Take Guaifenesin by going to [facility] or  [facility] and getting MUCOUS RELIEF CONGESTION (400mg ), take 11/2 tabs by mouth AM and NOON. 3)  Drink lots of fluids anytime taking Guaifenesin.  4)  Take Tyl ES 2 tabs by mouth three times a day  5)  Gargle with warm salt water every 1/2 hr for 2 days. 6)  Keep lozenge in mouth otherwise. Prescriptions: TRIMOX 500 MG CAPS (AMOXICILLIN) 2 tabs by mouth two times a day  #40 x 0   Entered and Authorized by:   Shaune Leeks MD   Signed by:   Shaune Leeks MD on 11/21/2009   Method used:   Electronically to        [facility] (retail)       [address]       [place], [place]  [address]       Ph: [phone]       Fax:  [phone]   RxID:   [id number]   Current Allergies (reviewed today): ! * ALTACE ! IMDUR ! * QUINIDINE GLUCONATE ! * REOPRO

## 2010-09-09 NOTE — Letter (Signed)
Summary: [place] No Show Letter  [place] [facility]  [address] [place], [place] [address]   Phone: [phone]  Fax: [phone]    12/18/2009 MRN: [id number]  [facility] [address] [place], [place]  [address]   Dear Eric [name],   Our records indicate that you missed your scheduled appointment with _lab____________________ on __5.11.11__________.  Please contact this office to reschedule your appointment as soon as possible.  It is important that you keep your scheduled appointments with your physician, so we can provide you the best care possible.  Please be advised that there may be a charge for "no show" appointments.    Sincerely,   [place] [facility]

## 2010-09-09 NOTE — Progress Notes (Signed)
Summary: form regarding possible drug interaction  Phone Note From Pharmacy   Caller: [employer] Summary of Call: Letter regarding possible drug interaction is on your shelf. Initial call taken by: Lowella Petties CMA,  September 30, 2009 12:58 PM  Follow-up for Phone Call        Noted. PT checked regularly so "interaction" is being monitored. Follow-up by: Shaune Leeks MD,  September 30, 2009 1:40 PM

## 2010-09-09 NOTE — Letter (Signed)
Summary: Nadara Eaton letter  [place] at [facility]  [address] [place], [place] [address]   Phone: [phone]  Fax: [phone]       03/18/2010 MRN: [id number]  [facility] [address] [place], [place]  [address]  Dear Mr. HARING,  [facility] [place], and [facility] announce the retirement of Arta Silence, M.D., from full-time practice at the [facility] office effective February 06, 2010 and his plans of returning part-time.  It is important to Dr. Hetty Ely and to our practice that you understand that [facility] has seven physicians in our office for your health care needs.  We will continue to offer the same exceptional care that you have today.    Dr. Hetty Ely has spoken to many of you about his plans for retirement and returning part-time in the fall.   We will continue to work with you through the transition to schedule appointments for you in the office and meet the high standards that [place] is committed to.   Again, it is with great pleasure that we share the news that Dr. Hetty Ely will return to [facility] at [facility] in October of 2011 with a reduced schedule.    If you have any questions, or would like to request an appointment with one of our physicians, please call us at [phone] and press the option for Scheduling an appointment.  We take pleasure in providing you with excellent patient care and look forward to seeing you at your next office visit.  Our [facility] Physicians are:  Tillman Abide, M.D. Laurita Quint, M.D. Roxy Manns, M.D. Kerby Nora, M.D. Hannah Beat, M.D. Ruthe Mannan, M.D. We proudly welcomed Raechel Ache, M.D. and Eustaquio Boyden, M.D. to the practice in July/August 2011.  Sincerely,  [facility]

## 2010-09-11 NOTE — Medication Information (Signed)
Summary: ccr   Anticoagulant Therapy  Managed by: Weston Brass, PharmD Referring MD: Valera Castle, MD PCP: Laurita Quint MD Supervising MD: Patty Sermons, MD Indication 1: Atrial Flutter (ICD-427.32) Lab Used: [facility]  Site: [facility] INR POC 2.2 INR RANGE 2 - 3  Dietary changes: no    Health status changes: no    Bleeding/hemorrhagic complications: no    Recent/future hospitalizations: no    Any changes in medication regimen? yes       Details: Started taking amlodipine  Recent/future dental: no  Any missed doses?: no       Is patient compliant with meds? yes       Allergies: 1)  ! * Altace 2)  ! Imdur 3)  ! * Quinidine Gluconate 4)  ! * Reopro  Anticoagulation Management History:      The patient is taking warfarin and comes in today for a routine follow up visit.  Positive risk factors for bleeding include an age of 75 years or older and presence of serious comorbidities.  The bleeding index is 'intermediate risk'.  Positive CHADS2 values include History of HTN, Age > 75 years old, and History of Diabetes.  The start date was 04/21/1999.  Anticoagulation responsible provider: Patty Sermons, MD.  INR POC: 2.2.  Cuvette Lot#: [id number].  Exp: 09/2011.    Anticoagulation Management Assessment/Plan:      The patient's current anticoagulation dose is Coumadin 5 mg tabs: Take as directed by coumadin clinic..  The target INR is 2.0-3.0.  The next INR is due 10/02/2010.  Anticoagulation instructions were given to patient.  Results were reviewed/authorized by Weston Brass, PharmD.  He was notified by Linward Headland, PharmD candidate.         Prior Anticoagulation Instructions: INR:  2.5  Your INR is at goal today.  Continue to take Coumadin 2.5 mg every day.  Please give [place] a call when you find our your surgery schedule with your doctor.    Current Anticoagulation Instructions: INR 2.2 (INR goal: 2-3)  Take 1/2 tablet everyday.  Recheck in 4 weeks.

## 2010-09-11 NOTE — Progress Notes (Signed)
   Walk in Patient Form Recieved " Pt left BP readings" sent to Message Nurse [facility]  July 30, 2010 11:47 AM

## 2010-09-11 NOTE — Assessment & Plan Note (Signed)
Summary: f70m/dfg  Medications Added LISINOPRIL 5 MG TABS (LISINOPRIL) Take 1 tablet by mouth two times a day LISINOPRIL 20 MG TABS (LISINOPRIL) Take one tablet by mouth daily      Allergies Added:   Visit Type:  Follow-up Referring Provider:  n/a Primary Provider:  Laurita Quint MD  CC:  no complaints.  History of Present Illness: Eric [name] comes in today for a close follow up of his hypertension. He saw recently Eric [name] in the office. Please see that note. 24-hour urine showed no evidence of a neuroendocrine tumor. He has had labile hypertension for years. We have done a renal scan in the past.  He is also here for preoperative clearance for right scaphoid surgery.  He brings in her blood pressure log today that shows systolics in the one 8190 range on occasion 2 hours after taking his meds. He is splitting his low-dose lisinopril which we reviewed today. He is also on a beta blocker. He's had problems blood blood pressures in the past as well.  He had a sleep study that we obtained this week. We are waiting on the results. This could be contributing to his labile hypertension.  Current Medications (verified): 1)  Metoprolol Tartrate 100 Mg Tabs (Metoprolol Tartrate) .[name].. 1 Tab By Mouth Two Times A Day 2)  Lisinopril 5 Mg Tabs (Lisinopril) .... Take 1 Tablet By Mouth Two Times A Day 3)  Aspir-Low 81 Mg Tbec (Aspirin) .[name].. 1  Daily By Mouth 4)  Fish Oil 1000 Mg Caps (Omega-3 Fatty Acids) .[name].. 1tablet Twice A Day By Mouth 5)  Daily Multiple Vitamins  Tabs (Multiple Vitamin) .[name].. 1 Tablet Daily By Mouth 6)  Metformin Hcl 500 Mg  Tabs (Metformin Hcl) .... One Tab By Mouth Two Times A Day 7)  Tylenol Extra Strength 500 Mg  Tabs (Acetaminophen) .... 2 Tablets By Mouth As Needed Pain 8)  Hydrochlorothiazide 12.5 Mg  Tabs (Hydrochlorothiazide) .[name].. 1 Daily By Mouth 9)  Fenofibrate 160 Mg Tabs (Fenofibrate) .... One Tab By Mouth Once Daily 10)  Coumadin 5 Mg Tabs (Warfarin Sodium)  .... Take As Directed By Coumadin Clinic. 11)  Simvastatin 40 Mg Tabs (Simvastatin) .... Take One Tablet By Mouth Daily At Bedtime 12)  B Complex-B12  Tabs (B Complex Vitamins) .[name].. 1000 Micrograms (1 Tablet) Daily. 13)  Vitamin D 1000 Unit Tabs (Cholecalciferol) .... Take Twice A Week  Allergies (verified): 1)  ! * Altace 2)  ! Imdur 3)  ! * Quinidine Gluconate 4)  ! * Reopro  Past History:  Past Medical History: Last updated: 07/30/2010 CORONARY ARTERY DISEASE, S/P CABG 2000 (ICD-414.00)   a.  cath 2003: occluded S-RCA, occluded S-Dx; L-LAD ok; s/p PTCA to LAD   b.  myoview 11/2009: normal ATRIAL FIBRILLATION (ICD-427.31) ATRIAL FLUTTER (ICD-427.32)   a.  s/p RFCA COUMADIN THERAPY, SEC. A-FIB (ICD-V58.61) HYPERTRIGLYCERIDEMIA (ICD-272.1) HYPERTENSION (ICD-401.9) DIABETES MELLITUS, TYPE II (ICD-250.00) BRONCHITIS- ACUTE (ICD-466.0) SPECIAL SCREENING MALIGNANT NEOPLASM OF PROSTATE (ICD-V76.44)    Past Surgical History: Last updated: 03/03/2007 Angioplasty, CAD- diagonal  with rotational atherectomy 01/99 Carpel tunnel (?) bilateral Cath (cardiac ) angioplasty 01/00 Arthrectomy of LAD & PTCA CABG, CAD 2000 Admit unstable angina 04/03 PTCA Cardioversion 01/04 Hospital- a flutter/ cardioversion 05/07 Adenosine Myoview EF 56%, neg. Ischemia 03/06 Colonoscopy/ sigmoid polyps Bx. negative 10/01/06 (3 yrs) Adenosine Myoview nml 02/18/07  Family History: Last updated: 09/25/2009 Fatrher dec 59 CVA Aneurysm Htn Mother dec 85 Htn Brother dec CABG Prostate Ca  Brother dec Lung  Ca (smoker) blood clot Brother dec RF Valve disorder (smoker) Brother dec Lung Ca (smoker) Sister dec  68 Cerebral hemm  Sister A 59  Sister dec Breast Ca   Social History: Last updated: 09/18/2008 Occupation: [employer] Retired   Now does Air traffic controller Married 5 Chidren Former Smoker Alcohol use-no Drug use-no  Risk Factors: Alcohol Use: 0 (09/25/2009) Caffeine Use: 2  (09/25/2009) Exercise: yes (09/25/2009)  Risk Factors: Smoking Status: never (09/25/2009) Passive Smoke Exposure: no (09/25/2009)  Review of Systems       negative other than history of present illness  Vital Signs:  Patient profile:   77 year old male Height:      66.5 inches Weight:      182 pounds BMI:     29.04 Pulse rate:   70 / minute BP sitting:   124 / 68  (left arm) Cuff size:   regular  Vitals Entered By: Hardin Negus, RMA (August 13, 2010 11:32 AM)  Physical Exam  General:  obese.  no acute distress Head:  normocephalic and atraumatic Eyes:  PERRLA/EOM intact; conjunctiva and lids normal. Neck:  Neck supple, no JVD. No masses, thyromegaly or abnormal cervical nodes. Lungs:  Clear bilaterally to auscultation and percussion. Heart:  PMI nondisplaced regular rate and rhythm, no murmur rub or gallop, no obvious carotid bruits Abdomen:  soft, nondistended, good bowel sounds Msk:  decreased ROM.  decreased ROM.   Pulses:  pulses normal in all 4 extremities Extremities:  trace left pedal edema and trace right pedal edema.  trace left pedal edema and trace right pedal edema.   Neurologic:  Alert and oriented x 3. Skin:  Intact without lesions or rashes. Psych:  Normal affect.   Impression & Recommendations:  Problem # 1:  HYPERTENSION (ICD-401.9)  His updated medication list for this problem includes:    Metoprolol Tartrate 100 Mg Tabs (Metoprolol tartrate) .[name]... 1 tab by mouth two times a day    Lisinopril 20 Mg Tabs (Lisinopril) .[name]... Take one tablet by mouth daily    Aspir-low 81 Mg Tbec (Aspirin) .[name]... 1  daily by mouth    Hydrochlorothiazide 12.5 Mg Tabs (Hydrochlorothiazide) .[name]... 1 daily by mouth  His updated medication list for this problem includes:    Metoprolol Tartrate 100 Mg Tabs (Metoprolol tartrate) .[name]... 1 tab by mouth two times a day    Lisinopril 5 Mg Tabs (Lisinopril) .[name]... Take 1 tablet by mouth two times a day    Aspir-low 81 Mg Tbec  (Aspirin) .[name]... 1  daily by mouth    Hydrochlorothiazide 12.5 Mg Tabs (Hydrochlorothiazide) .[name]... 1 daily by mouth  Problem # 2:  CORONARY ARTERY DISEASE, S/P CABG 2000 (ICD-414.00)  His updated medication list for this problem includes:    Metoprolol Tartrate 100 Mg Tabs (Metoprolol tartrate) .[name]... 1 tab by mouth two times a day    Lisinopril 20 Mg Tabs (Lisinopril) .[name]... Take one tablet by mouth daily    Aspir-low 81 Mg Tbec (Aspirin) .[name]... 1  daily by mouth    Coumadin 5 Mg Tabs (Warfarin sodium) .[name]... Take as directed by coumadin clinic.  His updated medication list for this problem includes:    Metoprolol Tartrate 100 Mg Tabs (Metoprolol tartrate) .[name]... 1 tab by mouth two times a day    Lisinopril 20 Mg Tabs (Lisinopril) .[name]... Take one tablet by mouth daily    Aspir-low 81 Mg Tbec (Aspirin) .[name]... 1  daily by mouth    Coumadin 5  Mg Tabs (Warfarin sodium) .[name]... Take as directed by coumadin clinic.  Problem # 3:  ATRIAL FIBRILLATION (ICD-427.31) Assessment: Unchanged  His updated medication list for this problem includes:    Metoprolol Tartrate 100 Mg Tabs (Metoprolol tartrate) .[name]... 1 tab by mouth two times a day    Aspir-low 81 Mg Tbec (Aspirin) .[name]... 1  daily by mouth    Coumadin 5 Mg Tabs (Warfarin sodium) .[name]... Take as directed by coumadin clinic.  His updated medication list for this problem includes:    Metoprolol Tartrate 100 Mg Tabs (Metoprolol tartrate) .[name]... 1 tab by mouth two times a day    Aspir-low 81 Mg Tbec (Aspirin) .[name]... 1  daily by mouth    Coumadin 5 Mg Tabs (Warfarin sodium) .[name]... Take as directed by coumadin clinic.  Problem # 4:  ATRIAL FLUTTER (ICD-427.32) Assessment: Improved  His updated medication list for this problem includes:    Metoprolol Tartrate 100 Mg Tabs (Metoprolol tartrate) .[name]... 1 tab by mouth two times a day    Aspir-low 81 Mg Tbec (Aspirin) .[name]... 1  daily by mouth    Coumadin 5 Mg Tabs (Warfarin sodium) .[name]... Take as directed by coumadin  clinic.  His updated medication list for this problem includes:    Metoprolol Tartrate 100 Mg Tabs (Metoprolol tartrate) .[name]... 1 tab by mouth two times a day    Aspir-low 81 Mg Tbec (Aspirin) .[name]... 1  daily by mouth    Coumadin 5 Mg Tabs (Warfarin sodium) .[name]... Take as directed by coumadin clinic.  Problem # 5:  COUMADIN THERAPY, SEC. A-FIB (ICD-V58.61) Assessment: Unchanged  Patient Instructions: 1)  Your physician recommends that you schedule a follow-up appointment in: 6 months with Dr. Daleen Squibb 2)  Follow up in 2 weeks with a nurse room visit here: Bring BP log book, blood pressure check 2 hours after you have taken your morning medications and lab work BMET 3)  Your physician recommends that you return for lab work in: 4)  Your physician has recommended you make the following change in your medication:  Prescriptions: LISINOPRIL 20 MG TABS (LISINOPRIL) Take one tablet by mouth daily  #90 x 3   Entered by:   Lisabeth Devoid RN   Authorized by:   Gaylord Shih, MD, [facility]   Signed by:   Gaylord Shih, MD, [facility] on 08/13/2010   Method used:   Historical   RxID:   [id number]

## 2010-09-11 NOTE — Progress Notes (Signed)
Summary: name of pharmacy    Phone Note Call from Patient   Caller: Spouse Reason for Call: Talk to Nurse Summary of Call: pt's wife calling to let us know which pharmacy he uses[facility] Initial call taken by: Glynda Jaeger,  August 14, 2010 9:32 AM    Prescriptions: LISINOPRIL 20 MG TABS (LISINOPRIL) Take one tablet by mouth daily  #90 x 3   Entered by:   Lisabeth Devoid RN   Authorized by:   Gaylord Shih, MD, [facility]   Signed by:   Lisabeth Devoid RN on 08/14/2010   Method used:   Electronically to        [facility] (mail-order)       [address] [place]  [address]       Ph: [phone]       Fax: [phone]   RxID:   [id number]

## 2010-09-11 NOTE — Assessment & Plan Note (Signed)
Summary: bp check/401.9/wa  Nurse Visit   Vital Signs:  Patient profile:   77 year old male Height:      66.5 inches Weight:      182 pounds Pulse rate:   64 / minute BP sitting:   132 / 72  (left arm)  Vitals Entered By: Ellender Hose RN (August 26, 2010 11:10 AM) CC: NO CARDIAC COMPLAINTS TODAY Pain Assessment Patient in pain? no      Comments patient came in to the office today for a BP check. He stated his BP has been running high @ home 180-190s/90-100s. His BP was 132/72 today and 130/80 in the right arm. He will call us if he continues to get those readings. He said his BP cuff has been checked with the one here and is accurate. Whitney Maeola Sarah RN  August 26, 2010 11:13 AM    Allergies: 1)  ! * Altace 2)  ! Imdur 3)  ! * Quinidine Gluconate 4)  ! * Reopro

## 2010-09-11 NOTE — Letter (Signed)
Summary: Clearance Letter  [facility], Main Office  [address]   [place], [place] [address]   Phone: [phone]  Fax: [phone]    August 13, 2010  Re:     [facility] Address:   [address]     [place], [place]  [address] DOB:     11-May-1928 MRN:     [id number]   Dear Barry Brunner:   I have seen Mr. Westwood in the office today for surgical clearance.  He is cleared from a cardiac standpoint for his orthopaedic surgery with a low operative risk.  His Coumadin should be stopped 3 days prior to surgery. Should you have any questions please feel free to call us at [phone].         Sincerely,     Dr. Valera Castle Lisabeth Devoid RN

## 2010-09-11 NOTE — Medication Information (Signed)
Summary: ccr/tp   Anticoagulant Therapy  Managed by: Geoffry Paradise, PharmD Referring MD: Valera Castle, MD PCP: Laurita Quint MD Supervising MD: Gala Romney MD, Reuel Boom Indication 1: Atrial Flutter (ICD-427.32) Lab Used: [facility] [place] Site: [facility] INR POC 2.5 INR RANGE 2 - 3  Dietary changes: no    Health status changes: no    Bleeding/hemorrhagic complications: no    Recent/future hospitalizations: no    Any changes in medication regimen? no    Recent/future dental: no  Any missed doses?: no       Is patient compliant with meds? yes       Allergies: 1)  ! * Altace 2)  ! Imdur 3)  ! * Quinidine Gluconate 4)  ! * Reopro  Anticoagulation Management History:      Positive risk factors for bleeding include an age of 75 years or older and presence of serious comorbidities.  The bleeding index is 'intermediate risk'.  Positive CHADS2 values include History of HTN, Age > 75 years old, and History of Diabetes.  The start date was 04/21/1999.  Anticoagulation responsible provider: Bensimhon MD, Reuel Boom.  INR POC: 2.5.  Cuvette Lot#: [id number].  Exp: 09/2011.    Anticoagulation Management Assessment/Plan:      The patient's current anticoagulation dose is Coumadin 5 mg tabs: Take as directed by coumadin clinic..  The target INR is 2.0-3.0.  The next INR is due 07/30/2010.  Anticoagulation instructions were given to patient.  Results were reviewed/authorized by Geoffry Paradise, PharmD.         Prior Anticoagulation Instructions: INR 2.4  Continue taking Coumadin 0.5 tab (2.5 mg) every day. Return to clinic in 4 weeks.   Current Anticoagulation Instructions: INR:  2.5  Your INR is at goal today.  Continue to take Coumadin 2.5 mg every day.  Please give [place] a call when you find our your surgery schedule with your doctor.

## 2010-09-11 NOTE — Letter (Signed)
Summary: [facility]  [facility]   Imported By: Maryln Gottron 09/01/2010 12:19:54  _____________________________________________________________________  External Attachment:    Type:   Image     Comment:   External Document

## 2010-09-11 NOTE — Progress Notes (Signed)
   Phone Note Outgoing Call   Call placed by: Lisabeth Devoid RN,  July 30, 2010 1:52 PM Call placed to: Patient Summary of Call: blood pressure  Follow-up for Phone Call        Eric [name] brought in recent blood pressures since last visit with Eric [name] in October.  Bp remained consistently 190;-200/95-113.  Pt denies any blurred vision or dizziness.  Does c/o headaches "once in a while or so".  He will see Dr. Vern Claude PA Eric [name] this afternoon.  He also has surgery scheduled for pin placement in his right thumb on January 18,2012. [name] RN

## 2010-09-11 NOTE — Letter (Signed)
Summary: [facility]   Imported By: Lanelle Bal 08/05/2010 14:57:31  _____________________________________________________________________  External Attachment:    Type:   Image     Comment:   External Document

## 2010-09-11 NOTE — Assessment & Plan Note (Signed)
Summary: 6 MTH FU/CLE  R/S FROM 08/28/10   Vital Signs:  Patient profile:   77 year old male Weight:      182 pounds Temp:     97.2 degrees F oral Pulse rate:   68 / minute Pulse rhythm:   irregular BP sitting:   160 / 98  (left arm) Cuff size:   regular  Vitals Entered By: Sydell Axon LPN (August 26, 2010 8:08 AM) CC: 6 Month follow-up   History of Present Illness: Pt here for followup. He has soft cast on his right wrist after falling about 14 weeks ago. Is to have screws placed tomm.  He has been seen by Cardiology a few times since here for BP control. His Lisinopril was increased to 20mg  and takes it in AM. His BP continues to vacillate.  His sugar has been pretty good....this AM 81, yest AM 80, day before 79.  He feels well. His wrist does not bother him..no pain per se. He was tested for Sleep Apnea which he has mildly but not felt necessary to treat. Could BP facility be reason to treat?  Problems Prior to Update: 1)  Hypertriglyceridemia  (ICD-272.1) 2)  Hypertension  (ICD-401.9) 3)  Diabetes Mellitus, Type II  (ICD-250.00) 4)  Fracture, Wrist, Right (SCAPHOID)  (ICD-814.00) 5)  Personal History of Colonic Polyps  (ICD-V12.72) 6)  Vitamin B12 Deficiency  (ICD-266.2) 7)  Coronary Artery Disease, S/p Cabg 2000  (ICD-414.00) 8)  Atrial Fibrillation  (ICD-427.31) 9)  Atrial Flutter  (ICD-427.32) 10)  Coumadin Therapy, Sec. A-fib  (ICD-V58.61) 11)  Special Screening Malignant Neoplasm of Prostate  (ICD-V76.44)  Medications Prior to Update: 1)  Metoprolol Tartrate 100 Mg Tabs (Metoprolol Tartrate) .[name].. 1 Tab By Mouth Two Times A Day 2)  Lisinopril 20 Mg Tabs (Lisinopril) .... Take One Tablet By Mouth Daily 3)  Aspir-Low 81 Mg Tbec (Aspirin) .[name].. 1  Daily By Mouth 4)  Fish Oil 1000 Mg Caps (Omega-3 Fatty Acids) .[name].. 1tablet Twice A Day By Mouth 5)  Daily Multiple Vitamins  Tabs (Multiple Vitamin) .[name].. 1 Tablet Daily By Mouth 6)  Metformin Hcl 500 Mg  Tabs (Metformin Hcl)  .... One Tab By Mouth Two Times A Day 7)  Tylenol Extra Strength 500 Mg  Tabs (Acetaminophen) .... 2 Tablets By Mouth As Needed Pain 8)  Hydrochlorothiazide 12.5 Mg  Tabs (Hydrochlorothiazide) .[name].. 1 Daily By Mouth 9)  Fenofibrate 160 Mg Tabs (Fenofibrate) .... One Tab By Mouth Once Daily 10)  Coumadin 5 Mg Tabs (Warfarin Sodium) .... Take As Directed By Coumadin Clinic. 11)  Simvastatin 40 Mg Tabs (Simvastatin) .... Take One Tablet By Mouth Daily At Bedtime 12)  B Complex-B12  Tabs (B Complex Vitamins) .[name].. 1000 Micrograms (1 Tablet) Daily. 13)  Vitamin D 1000 Unit Tabs (Cholecalciferol) .... Take Twice A Week  Allergies: 1)  ! * Altace 2)  ! Imdur 3)  ! * Quinidine Gluconate 4)  ! * Reopro  Physical Exam  General:  Well-developed,well-nourished,in no acute distress; alert,appropriate and cooperative throughout examination, mildly obese and clear. Head:  Normocephalic and atraumatic without obvious abnormalities. No apparent alopecia or balding. Eyes:  Conjunctiva clear bilaterally. Ears:  hearing aids.  L ear with dry cerumen covering most of ear canal,  able to clear enough to get view of TM, pearly greay. R TM clear Nose:  External nasal examination shows no deformity or inflammation. Nasal mucosa are pink and moist without lesions or exudates. congestion Mouth:  Oral mucosa  and oropharynx without lesions or exudates.  Teeth in good repair.  some PND Neck:  No deformities, masses, or tenderness noted. Lungs:  Normal respiratory effort, chest expands symmetrically. Lungs are clear to auscultation, no crackles or wheezes.  Heart:  Normal rate and regular rhythm. S1 and S2 normal without gallop, murmur, click, rub or other extra sounds.   Impression & Recommendations:  Problem # 1:  HYPERTENSION (ICD-401.9) Assessment Unchanged Continues out of control recently...add Amlodipine 5mg  at night. SWould be reasonable to wait until seen by Cardiology next week if he so desires. His  updated medication list for this problem includes:    Metoprolol Tartrate 100 Mg Tabs (Metoprolol tartrate) .[name]... 1 tab by mouth two times a day    Lisinopril 20 Mg Tabs (Lisinopril) .[name]... Take one tablet by mouth daily    Hydrochlorothiazide 12.5 Mg Tabs (Hydrochlorothiazide) .[name]... 1 daily by mouth    Amlodipine Besylate 5 Mg Tabs (Amlodipine besylate) ..... One tab by mouth at night  BP today: 160/98 Prior BP: 124/68 (08/13/2010)  Labs Reviewed: K+: 4.3 (07/30/2010) Creat: : 1.7 (07/30/2010)   Chol: 159 (12/20/2009)   HDL: 29.50 (12/20/2009)   LDL: 93 (12/20/2009)   TG: 185.0 (12/20/2009)  Problem # 2:  DIABETES MELLITUS, TYPE II (ICD-250.00) Get A1C today. Suspect he is not real careful with his diet. His updated medication list for this problem includes:    Lisinopril 20 Mg Tabs (Lisinopril) .[name]... Take one tablet by mouth daily    Aspir-low 81 Mg Tbec (Aspirin) .[name]... 1  daily by mouth    Metformin Hcl 500 Mg Tabs (Metformin hcl) ..... One tab by mouth two times a day  Labs Reviewed: Creat: 1.7 (07/30/2010)     Last Eye Exam: normal (03/24/2009) Reviewed HgBA1c results: 6.3 (09/20/2009)  6.1 (04/05/2009)  Orders: Venipuncture (11914) TLB-A1C / Hgb A1C (Glycohemoglobin) (83036-A1C)  Complete Medication List: 1)  Metoprolol Tartrate 100 Mg Tabs (Metoprolol tartrate) .[name].. 1 tab by mouth two times a day 2)  Lisinopril 20 Mg Tabs (Lisinopril) .... Take one tablet by mouth daily 3)  Aspir-low 81 Mg Tbec (Aspirin) .[name].. 1  daily by mouth 4)  Fish Oil 1000 Mg Caps (Omega-3 fatty acids) .[name].. 1tablet twice a day by mouth 5)  Daily Multiple Vitamins Tabs (Multiple vitamin) .[name].. 1 tablet daily by mouth 6)  Metformin Hcl 500 Mg Tabs (Metformin hcl) .... One tab by mouth two times a day 7)  Tylenol Extra Strength 500 Mg Tabs (Acetaminophen) .... 2 tablets by mouth as needed pain 8)  Hydrochlorothiazide 12.5 Mg Tabs (Hydrochlorothiazide) .[name].. 1 daily by mouth 9)  Fenofibrate 160 Mg Tabs  (Fenofibrate) .... One tab by mouth once daily 10)  Coumadin 5 Mg Tabs (Warfarin sodium) .... Take as directed by coumadin clinic. 11)  Simvastatin 40 Mg Tabs (Simvastatin) .... Take one tablet by mouth daily at bedtime 12)  B Complex-b12 Tabs (B complex vitamins) .[name].. 1000 micrograms (1 tablet) daily. 13)  Vitamin D 1000 Unit Tabs (Cholecalciferol) .... Take twice a week 14)  Amlodipine Besylate 5 Mg Tabs (Amlodipine besylate) .... One tab by mouth at night  Patient Instructions: 1)  RTC 6 mos for Comp Exam, labs prior. 2)  Add Amlodipine at night. Can wait until seen by Cardiology if so desires. 3)  Get eye exam. Prescriptions: AMLODIPINE BESYLATE 5 MG TABS (AMLODIPINE BESYLATE) one tab by mouth at night  #30 x 12   Entered and Authorized by:   Shaune Leeks MD  Signed by:   Shaune Leeks MD on 08/26/2010   Method used:   Print then Give to Patient   RxID:   [id number]    Orders Added: 1)  Venipuncture [14782] 2)  TLB-A1C / Hgb A1C (Glycohemoglobin) [83036-A1C] 3)  Est. Patient Level IV [95621]    Current Allergies (reviewed today): ! * ALTACE ! IMDUR ! * QUINIDINE GLUCONATE ! * REOPRO

## 2010-09-11 NOTE — Assessment & Plan Note (Signed)
Summary: rov/dfg    Visit Type:  rov Referring Provider:  n/a Primary Provider:  Laurita Quint MD  CC:  BP has been running high...denies any other cardiac complaints today.  History of Present Illness: Primary Cardiologist:  Dr. Valera Castle  Eric [name] is a 77 year old male with a history of CAD, s/p CABG in 2000, cath in 2003 demonstrating an occluded vein graft to the RCA, occluded vein graft to the diagonal and a patent L.-LAD.  He underwent cutting balloon angioplasty to the LAD in April 2003.  His most recent nuclear scan was done in April 2011 and was normal.  He has a history of labile hypertension, atrial fibrillation on chronic Coumadin therapy and atrial flutter status post ablation in the past.  He had medications adjusted recently by Dr. Daleen Squibb.  He dropped off blood pressure readings today.  These ranged anywhere from 222/112-151/90.  The nurse contacted him and brought him in for followup on his blood pressure.  He feels well.  He denies chest pain, shortness of breath or syncope.  He denies exertional chest heaviness or tightness.  He describes NYHA class II-IIb symptoms.  He is able to do such activities as vacuum a room without chest pain or shortness of breath.  He denies orthopnea, PND or edema.  He denies any headaches or blurry vision.  He does not report any focal neurologic symptoms.  He checks his blood pressure in the morning and at night around the same time he takes his medications.  He does admit to eating "anything he wants."  It sounds as though he does have a high salt diet.  Of note, he needs hand surgery 08/27/2009.  Current Medications (verified): 1)  Metoprolol Tartrate 100 Mg Tabs (Metoprolol Tartrate) .[name].. 1 Tab By Mouth Two Times A Day 2)  Lisinopril 5 Mg Tabs (Lisinopril) .... Take One Tablet By Mouth in The Morning and 1 Tablet At Bedtime 3)  Aspir-Low 81 Mg Tbec (Aspirin) .[name].. 1  Daily By Mouth 4)  Fish Oil 1000 Mg Caps (Omega-3 Fatty Acids) .[name]..  1tablet Twice A Day By Mouth 5)  Daily Multiple Vitamins  Tabs (Multiple Vitamin) .[name].. 1 Tablet Daily By Mouth 6)  Metformin Hcl 500 Mg  Tabs (Metformin Hcl) .... One Tab By Mouth Two Times A Day 7)  Tylenol Extra Strength 500 Mg  Tabs (Acetaminophen) .... 2 Tablets By Mouth As Needed Pain 8)  Hydrochlorothiazide 12.5 Mg  Tabs (Hydrochlorothiazide) .[name].. 1 Daily By Mouth 9)  Fenofibrate 160 Mg Tabs (Fenofibrate) .... One Tab By Mouth Once Daily 10)  Coumadin 5 Mg Tabs (Warfarin Sodium) .... Take As Directed By Coumadin Clinic. 11)  Simvastatin 40 Mg Tabs (Simvastatin) .... Take One Tablet By Mouth Daily At Bedtime 12)  B Complex-B12  Tabs (B Complex Vitamins) .[name].. 1000 Micrograms (1 Tablet) Daily. 13)  Vitamin D 1000 Unit Tabs (Cholecalciferol) .... Take Twice A Week  Allergies: 1)  ! * Altace 2)  ! Imdur 3)  ! * Quinidine Gluconate 4)  ! * Reopro  Past History:  Past Medical History: CORONARY ARTERY DISEASE, S/P CABG 2000 (ICD-414.00)   a.  cath 2003: occluded S-RCA, occluded S-Dx; L-LAD ok; s/p PTCA to LAD   b.  myoview 11/2009: normal ATRIAL FIBRILLATION (ICD-427.31) ATRIAL FLUTTER (ICD-427.32)   a.  s/p RFCA COUMADIN THERAPY, SEC. A-FIB (ICD-V58.61) HYPERTRIGLYCERIDEMIA (ICD-272.1) HYPERTENSION (ICD-401.9) DIABETES MELLITUS, TYPE II (ICD-250.00) BRONCHITIS- ACUTE (ICD-466.0) SPECIAL SCREENING MALIGNANT NEOPLASM OF PROSTATE (ICD-V76.44)    Social History:  Reviewed history from 09/18/2008 and no changes required. 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  All other systems reviewed and negative.   Vital Signs:  Patient profile:   77 year old male Height:      66.5 inches Weight:      184 pounds BMI:     29.36 Pulse rate:   58 / minute Pulse rhythm:   irregular BP sitting:   120 / 80  (left arm) Cuff size:   large  Vitals Entered By: Danielle Rankin,  CMA (July 30, 2010 3:24 PM)  Physical Exam  General:  Well nourished, well developed, in no acute distress HEENT: normal Neck: no JVD Cardiac:  normal S1, S2; irreg irreg Lungs:  clear to auscultation bilaterally, no wheezing, rhonchi or rales Abd: soft, nontender, no hepatomegaly; no bruits Ext: no  edema Vascular: no carotid  bruits; no supraclavicular bruits Skin: warm and dry Neuro:  CNs 2-12 intact, no focal abnormalities noted    EKG  Procedure date:  07/30/2010  Findings:      Atrial Fibrillation Heart rate 58 Normal axis T-wave inversions in 1, aVL and V6 No significant change compared to prior tracings  Impression & Recommendations:  Problem # 1:  HYPERTENSION (ICD-401.9)  His blood pressure upon my check is 110/60 on the right and 118/62 on the left.  His machine records a pressure of 124/68. He has no supraclavicular bruits.  He has no abdominal bruits.  He had a normal renal artery ultrasound in 2007. He raised the question of whether or not he could have pheochromocytoma.  I seriously doubt this.  However, we will go ahead and get a 24-hour urine.  I think his diet is suspect.  He is also checking his blood pressure before taking his medicines.  Therefore, I have recommended that he check his pressure one to 2 hours after taking his medicines in the morning and in the evening and record these.  I have asked him to reduce his salt intake.  His wife notes that he does snore and she has witnessed apneic episodes.  He reports some daytime hypersomnolence.  He should be evaluated for sleep apnea which may be impacting his blood pressure.  Therefore, we will arrange a sleep study.  He has hand surgery upcoming.  Therefore, the sleep study will be arranged in several weeks.  I will bring him back in a couple of weeks with either Dr. Daleen Squibb or myself to review his blood pressures and make further recommendations, if any.  Orders: EKG w/ Interpretation (93000) TLB-BMP  (Basic Metabolic Panel-BMET) (80048-METABOL) T-Urine 24 Hr. Catecholamines [phone]) T-Urine 24 Hr. Metanephrines [phone]) T-Urine 24hr. VMA/HVA (82570/[id number]) Sleep Study (Sleep Study)  Problem # 2:  FRACTURE, WRIST, RIGHT (SCAPHOID) (ICD-814.00) The patient had a normal Myoview in April of this year.  He denies chest pain or shortness of breath.  He does not have any unstable cardiac conditions.  He is able to achieve 4 METS without chest pain or shortness of breath.  His heart rate is controlled with his atrial fibrillation.  According to [facility] and AHA guidelines, he requires no further cardiac workup prior to his noncardiac surgery and should be at  acceptable risk.  Our service is available in the perioperative period as necessary.  He has no history of stroke, and can  hold his coumadin prior to his surgery.  It should be restarted post-op when felt to be safe.  Problem # 3:  CORONARY ARTERY DISEASE, S/P CABG 2000 (ICD-414.00)  Stable. No angina. See above.  Problem # 4:  ATRIAL FIBRILLATION (ICD-427.31)  Rate controlled.  On coumadin. See above regarding surgery.  Patient Instructions: 1)  Your physician recommends that you schedule a follow-up appointment in: 2WEEKS WITH DR.WALL 2)  Your physician recommends that you return for lab work [id number]:[id number] FOR BMET AND 24 HR URINES 3)  Your physician has requested that you limit the intake of sodium (salt) in your diet to four grams daily. Please see [facility] handout. 4)  Your physician has requested that you regularly monitor and record your blood pressure readings at home.  Please use the same machine at the same time of day to check your readings and record them to bring to your follow-up visit. CHECK BP 1-2 HOURS AFTER TAKING MEDICINE. 5)  Your physician has recommended that you have a sleep study.  This test records several body functions during sleep, including:  brain activity, eye movement, oxygen and carbon dioxide blood  levels, heart rate and rhythm, breathing rate and rhythm, the flow of air through your mouth and nose, snoring, body muscle movements, and chest and belly movement. TO HAVE THIS IN FEBRUARY AFTER HAND SURGERY.

## 2010-09-12 ENCOUNTER — Encounter: Payer: Self-pay | Admitting: Family Medicine

## 2010-09-12 NOTE — Consult Note (Signed)
Summary: [facility] - scaphoid [facility]  [facility]   Imported By: Lanelle Bal 06/13/2010 15:14:27  _____________________________________________________________________  External Attachment:    Type:   Image     Comment:   External Document

## 2010-09-15 ENCOUNTER — Institutional Professional Consult (permissible substitution) (INDEPENDENT_AMBULATORY_CARE_PROVIDER_SITE_OTHER): Payer: Medicare Other | Admitting: Pulmonary Disease

## 2010-09-15 ENCOUNTER — Encounter: Payer: Self-pay | Admitting: Pulmonary Disease

## 2010-09-15 DIAGNOSIS — G4733 Obstructive sleep apnea (adult) (pediatric): Secondary | ICD-10-CM

## 2010-09-15 DIAGNOSIS — G2589 Other specified extrapyramidal and movement disorders: Secondary | ICD-10-CM

## 2010-09-25 NOTE — Letter (Signed)
Summary: [facility]  [facility]   Imported By: Kassie Mends 09/15/2010 08:25:10  _____________________________________________________________________  External Attachment:    Type:   Image     Comment:   External Document

## 2010-09-29 NOTE — Op Note (Signed)
NAMECYNCERE, SONTAG               ACCOUNT NO.:  [id number]  MEDICAL RECORD NO.:  [id number]          PATIENT TYPE:  AMB  LOCATION:  [facility]                          FACILITY:  [facility]  PHYSICIAN:  Cindee Salt, M.D.       DATE OF BIRTH:  [birth year]  DATE OF PROCEDURE:  08/27/2010 DATE OF DISCHARGE:                              OPERATIVE REPORT   PREOPERATIVE DIAGNOSIS:  Carpometacarpal arthritis right thumb, nonunion of right scaphoid.  POSTOPERATIVE DIAGNOSIS:  Carpometacarpal arthritis right thumb, nonunion of right scaphoid.  OPERATION:  Excision trapezium with TightRope suspensionplasty, bone graft scaphoid nonunion with Acutrak screw fixation x2.  SURGEON:  Cindee Salt, MD  ASSISTANT:  Artist Pais. Mina Marble, MD  ANESTHESIA:  Axillary general.  ANESTHESIOLOGIST:  Janetta Hora. Gelene Mink, MD  HISTORY:  The patient is an 77 year old male who suffered a fall suffering a fracture of his scaphoid.  This has gone on to nonunion.  He has significant degenerative changes of carpometacarpal joint of his thumb with pain at both areas.  He has elected to undergo surgical repair of the scaphoid nonunion with bone graft.  We will also treat the [facility] arthritis with the plan on a suspensionplasty with excision of trapezium, stabilization with a TightRope, screw fixation of the scaphoid nonunion.  Pre and postoperative course have been discussed along with risks and complications.  He is aware that there is no guarantee with the surgery, possibility of infection, recurrence, injury to arteries, nerves, and tendons, incomplete relief of symptoms, dystrophy, breakdown of the TightRope, and the possibility of continued nonunion of the scaphoid.  He has elected to proceed.  In the preoperative area, the patient is seen.  The extremity marked by both the patient and surgeon.  Antibiotic given.  PROCEDURE:  The patient is brought to the operating room where an axillary block was carried out without  difficulty.  He was prepped using DuraPrep, supine position, left arm free after general anesthetic was also given.  This was done under the direction of Dr. Gelene Mink. A 3- minute dry time was allowed.  Time-out taken confirming the patient and procedure.  The limb was exsanguinated with an Esmarch bandage. Tourniquet placed high on the arm was inflated to 250 mmHg.  A curvilinear incision was made over the radial side of the hand to the volar aspect along the flexor carpi radialis tendon, carried down through subcutaneous tissue.  The radial sensory nerves were identified and protected.  Dissection was carried down to the interval between the extensor brevis and abductor pollicis longus.  The radial artery was identified.  Significant scarring was present with significant deformity of the trapezium was noted.  The trapezium well was then isolated after identification of both the carpometacarpal joint and STT joint.  The radial artery protected.  The trapezium was morselized and removed in pieces.  This was extremely large with significant osteophyte formation. There was no cartilage present either proximally or distally.  There was virtually no motion between the STT joint.  The scaphoid nonunion was then identified after making a volar incision through the radial scaphocapitate ligament.  The scaphoid nonunion was  immediately apparent.  This was debrided.  A bone graft was then selected from the trapezium.  This was shaped into a disk fitting into the nonunion site. The guide for the ankle pin was then selected.  This was placed over the proximal pole of scaphoid and used to align a guide pin down the shaft of the scaphoid.  X-rays confirmed its position, however, it was slightly ulnar.  A second guide pin was then placed.  This appeared to be in good position.  This was then drilled for a mini Acutrak screw. This measured at 18 mm.  This was inserted, it was slightly radial.  A second  screw was then placed after placing a third guide wire.  This was a 16-mm screw.  This firmly fixed the scaphoid.  After irrigation, the radial scaphocapitate ligament was repaired with 4-0 Vicryl sutures. The trapezium was then discarded.  The drill holes were placed for TightRope to the base of the metacarpal from a distal to proximal direction exiting through the base.  A drill hole was then placed through the base of the second metacarpal.  A small TightRope was then inserted from the thumb over to the index.  This was engaged into the index metacarpal.  A right-angle clamp was placed between the sutures and the button tied over the thumb metacarpal suspending the thumb from the index metacarpal.  This was firm and showed no proximal translation. The wounds were copiously irrigated with saline.  X-rays revealed that the scaphoid was in good position.  The trapezium was excised with a good suspension of the thumb from the index via the TightRope.  The wounds were irrigated.  The capsule was closed with figure-of-eight 4-0 Vicryl sutures, the subcutaneous tissue with interrupted 4-0 Vicryl, and skin with interrupted 4-0 Vicryl Rapide sutures.  Sterile compressive dressing, dorsal palmar thumb spica splint applied.  On deflation of the tourniquet, all fingers immediately pinked.  He was taken to the recovery room for observation in satisfactory condition.  He will be admitted for overnight stay.  If he is comfortable, he will be discharged to home on Percocet.  He is to return to the [facility] [place] [facility] in 1 week.          ______________________________ Cindee Salt, M.D.     GK/MEDQ  D:  08/27/2010  T:  08/28/2010  Job:  [id number]  cc:   Arta Silence, MD  Electronically Signed by Cindee Salt M.D. on 09/29/2010 84:69:62 PM

## 2010-10-01 DIAGNOSIS — I4892 Unspecified atrial flutter: Secondary | ICD-10-CM

## 2010-10-01 DIAGNOSIS — I4891 Unspecified atrial fibrillation: Secondary | ICD-10-CM

## 2010-10-01 NOTE — Assessment & Plan Note (Signed)
Summary: consult for osa, ?plms   Copy to:  Valera Castle Primary Provider/Referring Provider:  Laurita Quint MD  CC:  Sleep Consult.  History of Present Illness: The pt is an 77y/o male who I have been asked to see for an abnormal sleep study.  He had a npsg last month where he was found to have very mild osa with AHI 7/hr, and desat to 83% transiently.  He was also found to have 198 PLMS, with 3/hr resulting in arousal or awakening.  The pt has been noted to have loud snoring, as well as an abnormal breathing pattern during sleep.  He goes to bed at 11pm, and arises at 730am to start his day.  He feels rested most am's upon arising.  He denies classic RLS symptoms, but does note a "stinging in feet" during the night.  His wife sleeps in a different room, but he states the bedcovers are not dishevelled in the am upon arising.  The pt does not feel he is bothered by daytime sleepiness, but his wife states that he will doze quite a bit if he sits down for a period of time.  He admits that he can doze watching tv if not interesting.  He denies any sleepiness while driving.  He tells me that his weight is neutral over the past 2 years.    Medications Prior to Update: 1)  Metoprolol Tartrate 100 Mg Tabs (Metoprolol Tartrate) .[name].. 1 Tab By Mouth Two Times A Day 2)  Lisinopril 20 Mg Tabs (Lisinopril) .... Take One Tablet By Mouth Daily 3)  Aspir-Low 81 Mg Tbec (Aspirin) .[name].. 1  Daily By Mouth 4)  Fish Oil 1000 Mg Caps (Omega-3 Fatty Acids) .[name].. 1tablet Twice A Day By Mouth 5)  Daily Multiple Vitamins  Tabs (Multiple Vitamin) .[name].. 1 Tablet Daily By Mouth 6)  Metformin Hcl 500 Mg  Tabs (Metformin Hcl) .... One Tab By Mouth Two Times A Day 7)  Tylenol Extra Strength 500 Mg  Tabs (Acetaminophen) .... 2 Tablets By Mouth As Needed Pain 8)  Hydrochlorothiazide 12.5 Mg  Tabs (Hydrochlorothiazide) .[name].. 1 Daily By Mouth 9)  Fenofibrate 160 Mg Tabs (Fenofibrate) .... One Tab By Mouth Once Daily 10)  Coumadin 5  Mg Tabs (Warfarin Sodium) .... Take As Directed By Coumadin Clinic. 11)  Simvastatin 40 Mg Tabs (Simvastatin) .... Take One Tablet By Mouth Daily At Bedtime 12)  B Complex-B12  Tabs (B Complex Vitamins) .[name].. 1000 Micrograms (1 Tablet) Daily. 13)  Vitamin D 1000 Unit Tabs (Cholecalciferol) .... Take Twice A Week 14)  Amlodipine Besylate 5 Mg Tabs (Amlodipine Besylate) .... One Tab By Mouth At Night  Allergies (verified): 1)  ! * Altace 2)  ! Imdur 3)  ! * Quinidine Gluconate 4)  ! * Reopro  Past History:  Past Medical History: CORONARY ARTERY DISEASE, S/P CABG 2000 (ICD-414.00)   a.  cath 2003: occluded S-RCA, occluded S-Dx; L-LAD ok; s/p PTCA to LAD   b.  myoview 11/2009: normal ATRIAL FIBRILLATION (ICD-427.31) ATRIAL FLUTTER (ICD-427.32)   a.  s/p RFCA COUMADIN THERAPY, SEC. A-FIB (ICD-V58.61) HYPERTRIGLYCERIDEMIA (ICD-272.1) HYPERTENSION (ICD-401.9) DIABETES MELLITUS, TYPE II (ICD-250.00) OSA--very mild.  AHI 7/hr 07/2010 SPECIAL SCREENING MALIGNANT NEOPLASM OF PROSTATE (ICD-V76.44)    Past Surgical History: Reviewed history from 09/02/2010 and no changes required. Angioplasty, CAD- diagonal  with rotational atherectomy 01/99 Carpel tunnel (?) bilateral Cath (cardiac ) angioplasty 01/00 Arthrectomy of LAD & PTCA CABG, CAD 2000 Admit unstable angina 04/03 PTCA Cardioversion 01/04  Hospital- a flutter/ cardioversion 05/07 Adenosine Myoview EF 56%, neg. Ischemia 03/06 Colonoscopy/ sigmoid polyps Bx. negative 10/01/06 (3 yrs) Adenosine Myoview nml 02/18/07 R Thumb Procedure with Scaphoid Graft and Screws (Dr Merlyn Lot) 08/27/09  Family History: Reviewed history from 09/25/2009 and no changes required. Fatrher dec 59 CVA Aneurysm Htn Mother dec 85 Htn Brother dec CABG Prostate Ca  Brother dec Lung Ca (smoker) blood clot Brother dec RF Valve disorder (smoker) Brother dec Lung Ca (smoker) Sister dec  25 Cerebral hemm  Sister A 65  Sister dec Breast Ca   Social  History: Reviewed history from 09/18/2008 and no changes required. Occupation: [employer] Retired   Now does antiques Married 5 Chidren Never smoker.  Alcohol use-no Drug use-no  Review of Systems       The patient complains of shortness of breath with activity, non-productive cough, and irregular heartbeats.  The patient denies shortness of breath at rest, productive cough, coughing up blood, chest pain, acid heartburn, indigestion, loss of appetite, weight change, abdominal pain, difficulty swallowing, sore throat, tooth/dental problems, headaches, nasal congestion/difficulty breathing through nose, sneezing, itching, ear ache, anxiety, depression, hand/feet swelling, joint stiffness or pain, rash, change in color of mucus, and fever.    Vital Signs:  Patient profile:   77 year old male Height:      66.5 inches Weight:      186.13 pounds BMI:     29.70 O2 Sat:      97 % on Room air Temp:     97.5 degrees F oral Pulse rate:   58 / minute BP sitting:   132 / 80  (left arm) Cuff size:   regular  Vitals Entered By: Arman Filter LPN (September 15, 2010 1:39 PM)  O2 Flow:  Room air CC: Sleep Consult Comments Medications reviewed with patient. Arman Filter LPN  September 15, 2010 1:45 PM    Physical Exam  General:  4 male in nad Eyes:  PERRLA and EOMI.   Nose:  narrowed bilat, but patent Mouth:  moderate elongation of soft palate and uvula no exudates seen. Neck:  no jvd, tmg, LN Lungs:  clear to auscultation no wheezing or rhonchi Heart:  rrr, no mrg Abdomen:  soft and nontender, bs+ Extremities:  no significant edema, no cyanosis pulses intact distally Neurologic:  alert and oriented, moves all 4. does not appear sleepy today   Impression & Recommendations:  Problem # 1:  OBSTRUCTIVE SLEEP APNEA (ICD-327.23) the pt has very mild osa by his sleep study, and even though he snores, it is unclear if this is an issue for him wrt impact on QOL.  It is not  significant enough to increase his CV risks, and therefore the only reason to treat aggressively would be for QOL issues.  He does have some daytime sleepiness with periods of inactivity, but also has very large numbers of leg jerks during the night (see below).  For now, I have asked him to work on weight loss.  Problem # 2:  PERIODIC LIMB MOVEMENT DISORDER (ICD-333.99) Assessment: Unchanged the pt had large numbers of PLMS on his sleep study, with associated sleep disruption.  He denies any classic symptoms of RLS, but sleeps alone and therefore unclear if he kicks during the night.  He thinks he does not.  I think it is worth giving a trial of a dopamine agonist to see if he notices a difference, and the pt is agreeable.  Medications Added to  Medication List This Visit: 1)  Requip 0.5 Mg Tabs (Ropinirole hcl) .... Take one after dinner each night  Other Orders: Consultation Level IV (04540)  Patient Instructions: 1)  will try on requip for the next 3 weeks.  Take one after dinner 8-9pm on full stomach each night 2)  please call me in 3 weeks to let me know how things are going.  I am looking for improved sleep and better daytime alertness. 3)  work on weight loss to improve your snoring.     Prescriptions: REQUIP 0.5 MG  TABS (ROPINIROLE HCL) take one after dinner each night  #25 x 0   Entered and Authorized by:   Barbaraann Share MD   Signed by:   Barbaraann Share MD on 09/15/2010   Method used:   Print then Give to Patient   RxID:   [id number]

## 2010-10-02 ENCOUNTER — Encounter (INDEPENDENT_AMBULATORY_CARE_PROVIDER_SITE_OTHER): Payer: Medicare Other

## 2010-10-02 ENCOUNTER — Encounter: Payer: Self-pay | Admitting: Cardiology

## 2010-10-02 DIAGNOSIS — Z7901 Long term (current) use of anticoagulants: Secondary | ICD-10-CM

## 2010-10-02 DIAGNOSIS — I4892 Unspecified atrial flutter: Secondary | ICD-10-CM

## 2010-10-07 ENCOUNTER — Encounter: Payer: Self-pay | Admitting: Family Medicine

## 2010-10-07 LAB — HM DIABETES EYE EXAM: HM Diabetic Eye Exam: NORMAL

## 2010-10-07 NOTE — Medication Information (Signed)
Summary: rov/sp  Anticoagulant Therapy  Managed by: Eda Keys, PharmD Referring MD: Valera Castle, MD PCP: Laurita Quint MD Supervising MD: Myrtis Ser MD, Tinnie Gens Indication 1: Atrial Flutter (ICD-427.32) Lab Used: [facility] Site: [facility] INR POC 2.4 INR RANGE 2 - 3  Dietary changes: no    Health status changes: no    Bleeding/hemorrhagic complications: no    Recent/future hospitalizations: no    Any changes in medication regimen? no    Recent/future dental: no  Any missed doses?: no       Is patient compliant with meds? yes       Current Medications (verified): 1)  Metoprolol Tartrate 100 Mg Tabs (Metoprolol Tartrate) .[name].. 1 Tab By Mouth Two Times A Day 2)  Lisinopril 20 Mg Tabs (Lisinopril) .... Take One Tablet By Mouth Daily 3)  Aspir-Low 81 Mg Tbec (Aspirin) .[name].. 1  Daily By Mouth 4)  Fish Oil 1000 Mg Caps (Omega-3 Fatty Acids) .[name].. 1tablet Twice A Day By Mouth 5)  Daily Multiple Vitamins  Tabs (Multiple Vitamin) .[name].. 1 Tablet Daily By Mouth 6)  Metformin Hcl 500 Mg  Tabs (Metformin Hcl) .... One Tab By Mouth Two Times A Day 7)  Tylenol Extra Strength 500 Mg  Tabs (Acetaminophen) .... 2 Tablets By Mouth As Needed Pain 8)  Hydrochlorothiazide 12.5 Mg  Tabs (Hydrochlorothiazide) .[name].. 1 Daily By Mouth 9)  Fenofibrate 160 Mg Tabs (Fenofibrate) .... One Tab By Mouth Once Daily 10)  Coumadin 5 Mg Tabs (Warfarin Sodium) .... Take As Directed By Coumadin Clinic. 11)  Simvastatin 40 Mg Tabs (Simvastatin) .... Take One Tablet By Mouth Daily At Bedtime 12)  B Complex-B12  Tabs (B Complex Vitamins) .[name].. 1000 Micrograms (1 Tablet) Daily. 13)  Vitamin D 1000 Unit Tabs (Cholecalciferol) .... Take Twice A Week 14)  Amlodipine Besylate 5 Mg Tabs (Amlodipine Besylate) .... One Tab By Mouth At Night 15)  Requip 0.5 Mg  Tabs (Ropinirole Hcl) .... Take One After Dinner Each Night  Allergies (verified): 1)  ! * Altace 2)  ! Imdur 3)  ! * Quinidine Gluconate 4)  ! *  Reopro  Anticoagulation Management History:      The patient is taking warfarin and comes in today for a routine follow up visit.  Positive risk factors for bleeding include an age of 31 years or older and presence of serious comorbidities.  The bleeding index is 'intermediate risk'.  Positive CHADS2 values include History of HTN, Age > 65 years old, and History of Diabetes.  The start date was 04/21/1999.  Anticoagulation responsible provider: Myrtis Ser MD, Tinnie Gens.  INR POC: 2.4.  Cuvette Lot#: [id number].  Exp: 08/2011.    Anticoagulation Management Assessment/Plan:      The patient's current anticoagulation dose is Coumadin 5 mg tabs: Take as directed by coumadin clinic..  The target INR is 2.0-3.0.  The next INR is due 10/30/2010.  Anticoagulation instructions were given to patient.  Results were reviewed/authorized by Eda Keys, PharmD.  He was notified by Eda Keys.         Prior Anticoagulation Instructions: INR 2.2 (INR goal: 2-3)  Take 1/2 tablet everyday.  Recheck in 4 weeks.  Current Anticoagulation Instructions: INR 2.4  Continue taking 1/2 tablet every day.  Return to clinic in 4 weeks.

## 2010-10-07 NOTE — Letter (Signed)
Summary: [facility]   Imported By: Lanelle Bal 09/29/2010 15:59:28  _____________________________________________________________________  External Attachment:    Type:   Image     Comment:   External Document

## 2010-10-16 ENCOUNTER — Encounter: Payer: Self-pay | Admitting: Family Medicine

## 2010-10-21 NOTE — Miscellaneous (Signed)
   Clinical Lists Changes  Observations: Added new observation of DMEYEEXAMNXT: 10/2011 (10/16/2010 7:47) Added new observation of DMEYEEXMRES: normal (10/07/2010 7:48) Added new observation of EYE EXAM BY: Dr Fransico Michael (10/07/2010 7:48) Added new observation of DIAB EYE EX: normal (10/07/2010 7:48)       Diabetes Management Exam:    Eye Exam:       Eye Exam done elsewhere          Date: 10/07/2010          Results: normal          Done by: Dr Fransico Michael

## 2010-10-27 LAB — GLUCOSE, CAPILLARY: Glucose-Capillary: 90 mg/dL (ref 70–99)

## 2010-10-28 NOTE — Letter (Signed)
Summary: [facility]   Imported By: Kassie Mends 10/20/2010 10:20:59  _____________________________________________________________________  External Attachment:    Type:   Image     Comment:   External Document

## 2010-10-30 ENCOUNTER — Ambulatory Visit (INDEPENDENT_AMBULATORY_CARE_PROVIDER_SITE_OTHER): Payer: Medicare Other | Admitting: *Deleted

## 2010-10-30 DIAGNOSIS — Z7901 Long term (current) use of anticoagulants: Secondary | ICD-10-CM

## 2010-10-30 DIAGNOSIS — I4891 Unspecified atrial fibrillation: Secondary | ICD-10-CM

## 2010-10-30 DIAGNOSIS — I4892 Unspecified atrial flutter: Secondary | ICD-10-CM

## 2010-10-30 NOTE — Patient Instructions (Signed)
INR 2.7 Continue 1/2 pill everyday. Recheck in 4 weeks.

## 2010-11-27 ENCOUNTER — Ambulatory Visit (INDEPENDENT_AMBULATORY_CARE_PROVIDER_SITE_OTHER): Payer: Medicare Other | Admitting: *Deleted

## 2010-11-27 DIAGNOSIS — I4892 Unspecified atrial flutter: Secondary | ICD-10-CM

## 2010-11-27 DIAGNOSIS — I4891 Unspecified atrial fibrillation: Secondary | ICD-10-CM

## 2010-11-27 LAB — POCT INR: INR: 2.6

## 2010-12-23 ENCOUNTER — Ambulatory Visit (INDEPENDENT_AMBULATORY_CARE_PROVIDER_SITE_OTHER): Payer: Medicare Other | Admitting: *Deleted

## 2010-12-23 DIAGNOSIS — I4891 Unspecified atrial fibrillation: Secondary | ICD-10-CM

## 2010-12-23 DIAGNOSIS — I4892 Unspecified atrial flutter: Secondary | ICD-10-CM

## 2010-12-23 DIAGNOSIS — Z7901 Long term (current) use of anticoagulants: Secondary | ICD-10-CM | POA: Insufficient documentation

## 2010-12-23 LAB — POCT INR: INR: 2.4

## 2010-12-23 NOTE — Assessment & Plan Note (Signed)
Eric [name]                            CARDIOLOGY OFFICE NOTE   Eric [name], Eric [name]                      MRN:          [id number]  DATE:08/16/2007                            DOB:          September 24, 1927    Eric [name] returns today.  Other than some dizziness when he bends over  is doing well.   PROBLEM LIST:  1. Coronary disease.  Status post coronary bypass grafting in 2000.      Stress Myoview February 18, 2007 showed EF of 65% with no sign of scar      or ischemia.  2. Hyperlipidemia followed by Dr. Hetty Ely.  His LDL was at goal and      March 2008.  3. Labile hypertension which has been much better.  He has normal      renal arteries by renal ultrasound during a recent evaluation for      his hypertension.  4. Permanent atrial fibrillation on chronic Coumadin.  5. History of paroxysmal atrial flutter status post cardioversion      status post ablation 2001.   CURRENT MEDS:  1. Metoprolol 100 mg p.o. b.i.d.  2. Warfarin as directed.  3. Aspirin 81 mg a day.  4. Multivitamin.  5. Hydrochlorothiazide 12.5 mg a day.  6. Lovastatin 40 mg 2 tabs a day.  7. Metformin 500 mg p.o. b.i.d.   EXAM:  Blood pressure 133/80, his pulse 79 and regular.  Weight is 188.  HEENT:  Unchanged.  Carotids were equal bilaterally with without bruits.  There is no  thyromegaly.  Trachea is midline.  Lungs were clear.  HEART:  Reveals a nondisplaced PMI.  Soft S1-S2 irregular rate and  rhythm.  ABDOMEN:  Protuberant.  Good bowel sounds.  No midline bruit.  No  hepatomegaly.  EXTREMITIES:  No sinus clubbing or edema.  Pulses were reduced.  Feet  where slightly cool.  Capillary refill was down.  There is no sign of  DVT.  NEURO:  Exam is intact.   ASSESSMENT/PLAN:  Eric [name] is stable.  I have made no changes in  medical program.  We will see him back this summer.     Thomas C. Daleen Squibb, MD, [facility]  Electronically Signed    TCW/MedQ  DD: 08/16/2007  DT: 08/16/2007   Job #: [id number]   cc:   Arta Silence, MD

## 2010-12-23 NOTE — Assessment & Plan Note (Signed)
[facility]                            CARDIOLOGY OFFICE NOTE   Eric [name], Eric [name]                      MRN:          [id number]  DATE:09/12/2008                            DOB:          [birth year]    [name] comes in today for further management of his chronic atrial  fib which is relatively asymptomatic.  He has been doing remarkably  well, but he has gained about 9 pounds during the holidays.  He states  he has been cooking too much for his children!   He denies any orthopnea, PND, or peripheral edema.   He does have a history of atrial flutter, status post ablation.  He also  has a history of coronary artery disease, but is having no angina.  His  last stress Myoview was February 18, 2007.  EF 65% with no sinus scar  ischemia.   His blood pressure as well as his lipids are followed by Dr. Hetty Ely.   His meds are unchanged since his last visit.  Please refer to the  maintenance medication list.   PHYSICAL EXAMINATION:  VITAL SIGNS:  Blood pressure 130/90, pulse is 80  and irregular.  EKG shows atrial fib, well-controlled ventricular rate.  His weight is up to 189.  HEENT:  Normal.  NECK:  Carotids were equal bilaterally without bruits.  Thyroid is not  enlarged.  Trachea is midline.  LUNGS:  Clear to auscultation to percussion.  HEART:  Soft S1-S2.  PMI was hard to appreciate.  It is in regular rate  and rhythm.  ABDOMEN:  Protuberant, good bowel sounds.  No midline bruit.  No  pulsatile mass.  EXTREMITIES:  No cyanosis, clubbing, or edema.  Pulses were intact.  NEUROLOGIC:  Intact.  No sign of DVT.   [name] is doing well from my standpoint.  I have made no changes to  medical program.  I plan on seeing him back again in 6 months.  He will  need objective assessments of his coronaries at that time.     Thomas C. Daleen Squibb, MD, [facility]  Electronically Signed    TCW/MedQ  DD: 09/12/2008  DT: 09/13/2008  Job #: [id number]

## 2010-12-23 NOTE — Assessment & Plan Note (Signed)
[facility]                            CARDIOLOGY OFFICE NOTE   [name], [name]                      MRN:          [id number]  DATE:03/26/2008                            DOB:          April 19, 1928    Eric [name] comes in for close followup after having problems with  hypotension and some weakness and fatigue.  Please refer to my note on  February 14, 2008.   We started him back on metoprolol at lower dose of 50 mg b.i.d.  He has  been doing remarkably well with no fatigue and his blood pressures have  been good.  His blood pressure today is 128/88, his pulse is 78 and  regular, and his weight is 180.  Face is remarkable for a ruddy  complexion otherwise unchanged.  Carotid upstrokes are equal bilaterally  without bruits.  No JVD.  Thyroid is not enlarged.  Trachea is midline.  Lungs are clear.  Heart reveals soft S1 and S2.  Regular rate and  rhythm.  Abdomen examination soft, good bowel sounds.  No midline bruit.  No pulsatile mass. Extremities reveal no edema.  Pulses are intact.  Neuro exam is intact.   Eric [name] is doing well on metoprolol tartrate 50 b.i.d..  He is on low-  dose lisinopril 2.5 q.h.s. for his diabetes and hypertension.  All of  his other medications were reviewed.   I have made no change in his program.  We will plan on seeing him back  again in February 2010.     Eric C. Daleen Squibb, MD, [facility]  Electronically Signed    TCW/MedQ  DD: 03/26/2008  DT: 03/27/2008  Job #: [id number]   cc:   Eric Silence, MD

## 2010-12-23 NOTE — Assessment & Plan Note (Signed)
[facility]                            CARDIOLOGY OFFICE NOTE   Eric [name], Eric [name]                      MRN:          [id number]  DATE:02/14/2008                            DOB:          [birth year]    HISTORY OF PRESENT ILLNESS:  Eric [name] comes in today.  He had some  sort of viral illness last week that caused nausea and some upset  stomach and feeling weak and fatigued.   He saw Dr. Hetty Ely who apparently said his blood pressure was running  real low.  They discontinued his metoprolol at a 100 mg p.o. b.i.d.  He  has been checking his blood pressures since, and now his blood pressure  are running around 140 to about 176 over mid 90s.  His heart rate has  been running over 100 on most checks.   He is still fatigued, but he has increased shortness of breath while  climbing steps.   He has been placed on lisinopril 2.5 mg nightly since I last saw him.  I  guess, this is to protect his kidneys with his diabetes.   His other medications were unchanged.   PHYSICAL EXAMINATION:  VITAL SIGNS:  His blood pressure today is 142/85.  His pulse is 100 and irregular.  His EKG shows atrial fib with  nonspecific ST-segment changes.  His weight is 182, which is down 6.  SKIN:  Warm and dry.  GENERAL:  He  is alert and oriented x3.  NECK: Carotid upstrokes are equal bilaterally without bruits.  No JVD.  Thyroid is not enlarged.  Trachea is midline.  LUNGS: Clear.  HEART:  Reveals an irregular rate and rhythm.  No gallops.  ABDOMEN:  Soft.  EXTREMITIES:  Reveal trace edema. Pulses are intact.  NEURO:  Intact.   I had a long talk with Mr. and Ms. [name] today.  I have started his  metoprolol back at half the dose at 50 mg p.o. b.i.d.  He will check  blood pressures and heart rates for me, and I will see him back in the  office in about 4-6 weeks.     Thomas C. Daleen Squibb, MD, [facility]  Electronically Signed    TCW/MedQ  DD: 02/14/2008  DT: 02/15/2008  Job  #: [id number]

## 2010-12-23 NOTE — Assessment & Plan Note (Signed)
[facility]                            CARDIOLOGY OFFICE NOTE   ALEX, LEAHY                      MRN:          [id number]  DATE:02/08/2007                            DOB:          1928/07/07    Mr. Sheppard returns today for further management of the following issues:   1. Coronary artery disease, status post coronary artery bypass      grafting in 2000.  He is due for an adenosine Myoview.  He is      having no symptoms of ischemia.  He has normal left ventricular      systolic function.  2. Hyperlipidemia, followed by Dr. Hetty Ely.  3. Labile hypertension, which is improved.  4. Permanent atrial fibrillation, on chronic Coumadin therapy.  5. History of paroxysmal atrial flutter, status post cardioversion,      status post ablation in 2001.   He has had a cough for about 2 weeks.  It is really nonproductive.  He  denies any hemoptysis.  He has had no fever, chills, sweats.  He does  have a lot of allergies and hay fever.  These keep him awake at night.   MEDICATIONS:  1. Metoprolol 100 mg b.i.d.  2. Warfarin as directed.  3. Aspirin 81 mg a day.  4. Multivitamin.  5. Hydrochlorothiazide 12.5 mg daily.  6. Fish oil.  7. Lovastatin 40 mg q.h.s.  8. Metformin 500 mg b.i.d.   PHYSICAL EXAMINATION:  He coughed off and on throughout his interview  and exam.  Deep breaths seemed to provoke it.  His blood pressure was 140/88, pulse 76 and regular.  His weight is 181.  HEENT:  Normocephalic, atraumatic.  PERRLA.  Sclerae clear.  Facial  symmetry is normal.  NECK:  Supple.  Carotids are full without bruits.  There is no JVD.  Thyroid is not enlarged.  Trachea is midline.  LUNGS:  Clear breath sounds with no rhonchi or wheezes.  CARDIAC:  A normal S1, S2, without murmur or gallop.  ABDOMEN:  Protuberant, good bowel sounds.  EXTREMITIES:  No edema.  Pulses are present.  NEUROLOGIC:  Intact.   Chest x-ray was obtained, which showed no acute  cardiopulmonary disease.  He has some COPD, chronic degenerative joint disease in his back, and is  status post coronary bypass grafting.  We will get an official read from  radiology.  Electrocardiogram shows atrial fibrillation with a well-  controlled ventricular rate.   ASSESSMENT AND PLAN:  Mr. Hughett is stable from his cardiovascular  status.  He is due a stress Myoview.  We will arrange for this at his  convenience.  I will plan on seeing him back in 6 months.   It was suggested that he take Robitussin DM at night to suppress his  cough and try to ensure better sleep.  If it continues, he has been  asked to contact Dr. Hetty Ely.     Thomas C. Daleen Squibb, MD, [facility]  Electronically Signed    TCW/MedQ  DD: 02/08/2007  DT: 02/09/2007  Job #: [id number]   cc:   Molly Maduro  Madolyn Frieze, MD

## 2010-12-25 ENCOUNTER — Encounter: Payer: Medicare Other | Admitting: *Deleted

## 2010-12-26 NOTE — Op Note (Signed)
   NAME:  Eric [name], Eric [name] NO.:  [id number]   MEDICAL RECORD NO.:  [id number]                   PATIENT TYPE:  OIB   LOCATION:  2860                                 FACILITY:   PHYSICIAN:  Maisie Fus C. Wall, M.D. [facility]            DATE OF BIRTH:  [birth year]   DATE OF PROCEDURE:  09/07/2002  DATE OF DISCHARGE:                                 OPERATIVE REPORT   PROCEDURE:  DC cardioversion.   INDICATIONS FOR PROCEDURE:  Recurrent atrial flutter.   PROCEDURE IN DETAIL:  The patient was brought to the outpatient  cardioversion area.  Laboratory data is unremarkable with a potassium of  4.5, INR of 2.6.  After informed consent he was given 150 of Pentothal by  Dr. Katrinka Blazing of anesthesia.  He was cardioverted with 50 Joules.  He converted  to sinus rhythm with no complications.  Post procedure EKG is pending.   PLAN:  1. Continue current medications.  2. Follow up with Dr. Daleen Squibb in the office in 2-3 weeks.  Appointment will be     arranged.  3. If he reverts to atrial flutter EP consultation with question atrial     flutter ablation.                                               Thomas C. Daleen Squibb, M.D. [facility]    TCW/MEDQ  D:  09/07/2002  T:  09/07/2002  Job:  [id number]   cc:   Marne A. Tower, M.D. [facility]  [address], [place]  [place] [address]  Fax: 1

## 2010-12-26 NOTE — Discharge Summary (Signed)
Eric [name]               ACCOUNT NO.:  [id number]   MEDICAL RECORD NO.:  [id number]          PATIENT TYPE:  INP   LOCATION:  3704                         FACILITY:  [facility]   PHYSICIAN:  Charlton Haws, M.D.     DATE OF BIRTH:  February 04, 1928   DATE OF ADMISSION:  12/10/2005  DATE OF DISCHARGE:  12/11/2005                                 DISCHARGE SUMMARY   PROCEDURES:  Direct current cardioversion   DISCHARGE DIAGNOSES:  1.  Paroxysmal atrial flutter, status post ablation, in 2001, with direct      current cardioversion in 2004-2005 and this admission.  2.  Status post aortocoronary bypass surgery, in 2000, with left internal      mammary artery to left anterior descending artery, saphenous vein graft      to diagonal, and saphenous vein graft to right coronary artery.  3.  Status post cardiac catheterization, in 2003, with the saphenous vein      graft to right coronary artery and saphenous vein graft to diagonal      totaled, patent left internal mammary artery to left anterior descending      artery, and a cutting balloon percutaneous transluminal coronary      angioplasty to the left anterior descending artery.  4.  Hypertension.  5.  Hyperlipidemia.  6.  History of chronic thrombocytopenia, secondary ReoPro.  7.  Chronic anticoagulation with Coumadin.  8.  History of percutaneous transluminal coronary angioplasty and high-speed      rotational atherectomy in 1999, before his bypass.  9.  Status post gated exercise treadmill and Myoview, in 2006, showing no      ischemia and 56%.  10. Allergy or intolerance to quinidine and ReoPro.  11. Family history of coronary artery disease.   TIME OF DISCHARGE:  Thirty seven minutes.   HOSPITAL COURSE:  Eric [name] is a 77 year old male with known coronary  artery disease and known flutter.  Approximately 48 hours prior to  admission, he started having abdominal pain that he states started around  the umbilicus and radiated up into  some his subxiphoid area.  He had  palpitations associated with this.  He thought it was an irritated stomach,  although he has had these symptoms in the past when he had atrial flutter.  He went to his primary MD's office and because his symptoms, he was  transferred by EMS [facility].  He was admitted for further  evaluation and treatment.   His ventricular rate was controlled and so no medication changes were made  acutely.  His cardiac enzymes were cycled and an amylase and lipase were  obtained as well to make sure he had no acute process going on.  His INR was  therapeutic at 2.1.  His total bilirubin was slightly elevated at 1.3 but  other LFTs on his C-MET were within normal limits, except for a mildly low  albumin at 3.3.  Amylase was within normal limits at 36 and lipase was  slightly low at 16.  Serial CK-MB and troponin I were negative for MI.  Total  cholesterol was 125, triglycerides 101, HDL 28, LDL 77 on therapy.   The next day, he had not spontaneously converted.  He was evaluated by Dr.  Eden Emms who felt that direct current cardioversion was the best option.  This  was performed with sedation and biphasic defibrillation at 150 joules and  then 200 joules.  Initially, he converted to atrial fibrillation but then  sinus rhythm Wenckebach.  An EKG was performed which showed sinus rhythm  with first-degree AV block. The T waves were biphasic in some leads, but the  Wenckebach resolved.   By Dec 11, 2005 - p.m., Eric [name] was maintaining a sinus rhythm.  His vital  signs were stable.  He was considered stable for discharge with outpatient  followup arranged.   DISCHARGE INSTRUCTIONS:  1.  His activity level is to be increased gradually.  2.  He is to call our office for any signs of infection at the cardioversion      marks.  3.  He is to follow up with the [facility] as scheduled on May 10.  4.  He is to see Dr. Daleen Squibb on Jan 01, 2006 at 9:30.   DISCHARGE  MEDICATIONS:  1.  Vytorin 10/20 mg every day.  2.  Coumadin 5 mg, 1/2 tablet every day, except for one tablet on Monday and      Friday.  3.  Metoprolol 100 mg p.o. b.i.d.  4.  HCTZ 12.5 mg every day.  5.  TriCor 48 mg every day.  6.  Aspirin 81 mg every day.      Eric [name], P.A. [facility]    ______________________________  Charlton Haws, M.D.    RB/MEDQ  D:  12/11/2005  T:  12/12/2005  Job:  [id number]   cc:   Marne A. Tower, M.D. [facility]  [address], [place]  [place] [address]

## 2010-12-26 NOTE — Assessment & Plan Note (Signed)
[facility]                              CARDIOLOGY OFFICE NOTE   BELDON, NOWLING                      MRN:          [id number]  DATE:04/20/2006                            DOB:          06/25/1928    Mr. Alarie returns to day for management of his recurrent atrial  fibrillation.   Please see my note from Jan 05, 2006.  He has seen Dr. Graciela Husbands per my  recommendation.  I do not have a copy of Dr. Odessa Fleming note today, but Dr.  Graciela Husbands told me that he would just leave it alone with rate control and  anticoagulation.  I think this is quite reasonable.   He is not symptomatic.   MEDICATIONS:  His meds are unchanged since his last visit, except now his  Tricor is at 48 mg per day.   PHYSICAL EXAMINATION:  VITAL SIGNS:  His blood pressure is 126/72 with  pulses 72 and irregular.  He is in atrial fib by EKG.  His weight is 186.  SKIN:  Color is good.  NECK:  Carotids are full without bruits.  There is no JVD.  Thyroid is not  enlarged.  LUNGS: Clear.  HEART:  Irregular rate and rhythm.  ABDOMEN:  Clear with good bowel sounds.  EXTREMITIES:  Reveal no edema.  Pulses are present.   Mr. Highfill is doing well now with what appears to be chronic atrial fib.  I  have asked him to continue with his current medical program.  If he is  asymptomatic, we will plan on seeing him back in March of 2008.  At that  time, he will need a stress nuclear study.                               Thomas C. Daleen Squibb, MD, [facility]    TCW/MedQ  DD:  04/20/2006  DT:  04/21/2006  Job #:  [id number]   cc:   Arta Silence, MD

## 2010-12-26 NOTE — Procedures (Signed)
[place]. [facility]  Patient:    Eric [name], Eric [name]                      MRN: [id number] Proc. Date: 05/12/00 Adm. Date:  [id number] Disc. Date: [id number] Attending:  Nathen May                           Procedure Report  OPTICAL DISK:  150-A.  PREOPERATIVE DIAGNOSIS:  Atrial flutter.  POSTOPERATIVE DIAGNOSIS:  Atrial flutter.  PROCEDURE:  Invasive electrophysiological study and radiofrequency catheter ablation.  HISTORY:  Mr. Meininger is a 77 year old gentleman with a history of bypass surgery, atrial arrhythmias intermittent atrial flutter that appeared to be typical on surface electrocardiogram.  These were initially presented postoperatively because of recurrent arrhythmias, he was submitted for atrial flutter ablation.  DESCRIPTION OF PROCEDURE:  Cardiac catheterization was performed under general anesthesia.  At the end of the procedure, the catheters were removed, hemostasis was obtained and the patient was transferred to the PACU in stable condition.  CATHETERS:  A 5-French quadripolar catheter was inserted via the left femoral vein to the A-V junction.  A 7-French Duodecapolar catheter was inserted via the left femoral vein to the high right atrium and into the coronary sinus.  An 7-French, 8 mm deflectable tip catheter was inserted via the right femoral vein to ablation sites in the posterior isthmus.  Surface leads I, aVF and V1 were monitored continuously throughout the procedure.  Following insertion of the catheters, the stimulation protocol included atrial pacing.  Incremental ventricular pacing.  Burst atrial stimulation.  A single atrial extrastimuli paced cycle length of 400 msec.  Burst coronary sinus pacing.  RESULTS:  SURFACE ELECTROCARDIOGRAM:    Initial     Final. RHYTHM:                       Sinus       Sinus CYCLE LENGTH:                 1429        1524 msec. P-R INTERVAL:                  215          233 QRS DURATION:                   92         106 Q-T INTERVAL:                  501         538 P-WAVE DURATION:               131         159 PREEXCITATION:                Absent      Absent BUNDLE BRANCH BLOCK:          Absent      Absent  A-V NODAL FUNCTION: A-H INTERVAL:  110 msec with an A-V Wenckebach cycle length of 400 msec. A-V CONDUCTION:  Continuous.  HIS PURKINJE SYSTEM FUNCTION: H-V INTERVAL:  43 msec with a His bundle duration of 12 msec.  ACCESSORY PATHWAY:  No evidence of an accessory pathway was identified.  ARRHYTHMIAS INDUCED:  Atrial flutter was not inducible with programmed stimulation.  Repeated efforts to reduce atrial flutter  resulted in atrial fibrillation.  Because of that ablation was undertaken of the isthmus during the coronary sinus pacing.  This however proved to be unsuccessful despite multiple applications of RF energy ______ of which were recorded in the patients procedural folder.  Intermittent block was accomplished, but persistent block could not be accomplished.  FLUOROSCOPY TIME:  A total of 30 minutes of fluoroscopy time was utilized at 15 frames per second.  IMPRESSION: 1. Normal bradycardia. 2. Abnormal interatrial conduction with inducible atrial fibrillation  and    previous clinical atrial flutter. 3. Normal A-V nodal function. 4. Normal His Purkinje system function. 5. No accessory pathway. 6. Normal ventricular response to programmed stimulation as described above.  SUMMARY/CONCLUSION:  The results of electrophysiological testing failed to induce the patients clinical atrial flutter.  Because of that with the patients prior electrocardiograms, transisthmus conduction was subjected to ablation with the attempts of trying to eliminate the substrate for atrial flutter.  This however, could not be completely accomplished.  The patient had persistent transisthmus conduction at the end of the study despite numerous attempts to  eliminate this conduction.  RECOMMENDATIONS:  Consideration can be given to repeat ablation with alternative mapping systems, wherein transisthmus conduction and the break in the transisthmus block can be specifically identified or perhaps a voltage map accomplished to facilitate ablation. DD:  08/23/00 TD:  08/23/00 Job: [id number]

## 2010-12-26 NOTE — Op Note (Signed)
NAME:  Eric [name], Eric [name] NO.:  [id number]   MEDICAL RECORD NO.:  [id number]                   PATIENT TYPE:  OIB   LOCATION:  2899                                 FACILITY:  [facility]   PHYSICIAN:  Jesse Sans. Wall, M.D.                DATE OF BIRTH:  [birth year]   DATE OF PROCEDURE:  01/08/2004  DATE OF DISCHARGE:                                 OPERATIVE REPORT   PROCEDURE:  Cardioversion.   CARDIOLOGIST:  Jesse Sans. Wall, M.D.   INDICATIONS FOR PROCEDURE:  Recurrent atrial flutter, symptomatic with  fatigue and shortness of breath - code #427.32.   DESCRIPTION OF PROCEDURE:  After an informed consent, the patient was given  100 mg of Pentothal by Dr. Maren Beach.  A DC cardioversion was  successful with 50 joules.  He converted to sinus bradycardia with a rate of  58 beats per minute.  There were no complications.   PRE-PROCEDURE LABORATORY DATA:  Were within normal limits.  His INR was 2.9.  We are waiting for a post-procedure electrocardiogram.   DISPOSITION:  Discharge from the outpatient unit.  I will follow up with him  on January 30, 2004, at 2:15 p.m. at my office.                                               Thomas C. Wall, M.D.    TCW/MEDQ  D:  01/08/2004  T:  01/08/2004  Job:  [id number]   cc:   Thomas C. Wall, M.D.   Marne A. Milinda Antis, M.D. [facility]

## 2010-12-26 NOTE — Assessment & Plan Note (Signed)
[facility]                            CARDIOLOGY OFFICE NOTE   Eric [name], Eric [name]                      MRN:          [id number]  DATE:07/30/2006                            DOB:          1928-02-27    ADDENDUM   We did give Eric [name] a  prescription for Lisinopril 10 mg daily p.r.n.  He as instructed to take this if his blood pressure shot up like it had  in previous days.  In looking through the patient's chart, it was noted  that he has ALTACE LISTED AS AN ALLERGY.  The notes in the chart  indicate that Eric [name] had some tachy palpitations when he was placed  on ALTACE back in 2000.  There is no documented allergy in the records.  I will be in touch with Eric [name] by telephone to confirm this.  If he  did have a true allergy, we will tell him to discontinue the Lisinopril  and suggest another medication to take on a p.r.n. basis.   I actually spoke to Eric [name].  He notes a cough with Altace in the  past.  He can take the lisinopril prn.  If he needs a new, daily  antihypertensive, we should use an ARB instead of an ACE inhibitor.      Eric Newcomer, Eric [name]  Electronically Signed      Eric Pick. Eden Emms, MD, [facility]  Electronically Signed   SW/MedQ  DD: 07/30/2006  DT: 07/31/2006  Job #: [id number]

## 2010-12-26 NOTE — Cardiovascular Report (Signed)
[place]. [facility]  Patient:    Eric [name], Eric [name] Visit Number: [id number] MRN: [id number]          Service Type: MED Location: [phone] Attending Physician:  Mirian Mo Dictated by:   Arturo Morton Riley Kill, M.D. [facility] Proc. Date: 12/05/01 Admit Date:  11/29/2001   CC:         Thomas C. Daleen Squibb, M.D. [facility]  Roxy Manns, M.D. [facility]  CV Laboratory   Cardiac Catheterization  INDICATIONS: The patient is a delightful 77 year old, who has had previously revascularization surgery. This was done in 2000. He had occluded vein grafts to the nondominant right and also an occluded vein graft to a diagonal. The internal mammary is still intact, although there is some mild ostial narrowing of the mammary itself. The current study was done because of recurrent angina pectoris. I had reviewed the films with Dr. Juanda Chance and we had elected to recommend percutaneous angioplasty of the LAD leading into the diagonal and there was a lesion also in the nondominant right, which we felt should be left alone. There is also some segmental disease of the circumflex ______ PDA but this was also fairly distal and felt to be better treated medically. Preparations were then made for percutaneous coronary intervention after thorough discussion with the patient and consideration of all options.  PROCEDURES: Percutaneous angioplasty of the left anterior descending leading into the diagonal with a distally protected left anterior descending from an internal mammary.  DESCRIPTION OF PROCEDURE: The patient was brought to the catheterization lab and prepped and draped in the usual fashion. Through an anterior puncture, the left femoral artery was easily entered. A 7 French sheath was placed. Angiomax was given according to protocol. The patient had prior thrombocytopenia on abciximab. A JL4 guide was initially used then exchanged for a JL 3.5 guide for better seating. A Hi-Torque Floppy  wire was passed after the ACT was confirmed to be over 300 seconds. The lesion was then dilated using a 2.5 Cutting Balloon times 10 mm with fairly marked improvement in the appearance of the artery. To slightly improve on this, we used a 3 mm, Quantum Maverick balloon and this was taken up to 6 atmospheres or essentially 2.75. The vessel remained stable for 10 minutes after the procedure, and we therefore discontinued the procedure. I elected not to stent the lesion largely related to a septal perforator which had its exit right at the origin of this vessel. This is the proximal septal perforator, likely the supply source for the bundle of His. In addition, there was a fairly good angiographic result. We also did not elect to use drug-eluting stent in part related to the need for prolonged anticoagulation in a patient on Coumadin. The patient did have paroxysmal atrial fibrillation during the course of the procedure, and therefore will be started back on his Coumadin later tonight.  ANGIOGRAPHIC DATA: The LAD beyond an intermediate vessel demonstrates 95% stenosis leading into the diagonal. This covers the origin of a septal perforator which has its takeoff just beyond the stenosis. Following the dilatation, this was reduced from 80% down to 20% with good TIMI-3 flow. The remainder of the angiogram demonstrates no significant change in the remainder of the vessels from earlier. There is an intermediate multiple marginal branches. There is a PDA that is derived from the circumflex system that has some diffuse luminal irregularity with mid disease. We also took a picture of the left subclavian and there is good  flow down the internal mammary. There may a mild degree of constriction, but it does not appear to be critical. Also, following the LAD dilatation, the septal perforator remained intact without reduction in flow.  CONCLUSIONS: Successful percutaneous Cutting Balloon angioplasty of  the left anterior descending artery.  DISPOSITION: The patient will be treated medically. Followup will be with Dr. Daleen Squibb. Dictated by:   Arturo Morton Riley Kill, M.D. [facility] Attending Physician:  Mirian Mo DD:  12/05/01 TD:  12/06/01 Job: [id number]

## 2010-12-26 NOTE — Assessment & Plan Note (Signed)
[facility]                            CARDIOLOGY OFFICE NOTE   Eric [name], Eric [name]                      MRN:          [id number]  DATE:07/30/2006                            DOB:          1928-05-06    PRIMARY CARDIOLOGIST:  Dr. Valera Castle.   PRIMARY CARE PHYSICIAN:  Dr. Laurita Quint.   HISTORY OF PRESENT ILLNESS:  Eric [name] is a 77 year old male patient  followed by Dr. Daleen Squibb with a history of coronary artery disease status  post CABG in 2000, treated dyslipidemia, permanent atrial fibrillation  on chronic anticoagulation therapy and hypertension who presented to the  office today with complaints of labile hypertension. He went to the  emergency room 2 nights ago when his blood pressure went up into the  200s systolically. By the time he got to the ER, his blood pressure was  180/88. He was quite dizzy with this with unsteady gait. The ER actually  gave him Antivert prescription and sent him home. In the emergency room,  his pressure went down to 126/64 without treatment. He had lab work  done. His sodium was 132, potassium 4.5, BUN 29, creatinine 1.5. Point  of care markers negative x1. Hemoglobin 13.6, platelet count 172,000,  the white count 6400, INR 2.4. The patient was asked to followup with [place]  today. The patient notes that he has had fluctuations in his blood  pressure quite often since then. They have not gone as high as they were  the date he went to the emergency room. He has not had dizziness like he  had when he went to the emergency room but he often feels somewhat light  headed. There is no spinning sensation. He notes light headedness with  standing. Denies any syncope. The day he went to the emergency room he  did feel somewhat presyncopal but has not had any near syncope since  then. He denies orthopnea or paroxysmal nocturnal dyspnea. He denies any  lower extremity edema. He denies chest discomfort. He does note some  shortness of breath with exertion but this seems to be chronic without  changes.   CURRENT MEDICATIONS:  1. Metoprolol 100 mg twice daily.  2. Warfarin as directed.  3. Aspirin 81 mg daily.  4. Multivitamin.  5. Vytorin 10/20 mg daily.  6. HCTZ 12.5 mg a day.  7. Tricor 48 mg a day.  8. Fish oil.   ALLERGIES:  ALTACE, Respro , QUINIDINE.   PHYSICAL EXAMINATION:  GENERAL:  He is a well-nourished, well-developed  male in no distress.  VITAL SIGNS:  Blood pressure is 131/79, pulse 89, weight 185 pounds.  Orthostatic vital signs:  Blood pressure lying is 120/82, pulse 76;  sitting 115/76, pulse is 70; standing 133/79, pulse 72; after 2 minutes  144/88 with pulse 67; after 5 minutes 134/88 with a pulse 67.  HEENT:  Unremarkable without JVD.  CARDIOVASCULAR:  Normal S1, S2, irregularly irregular rhythm.  LUNGS:  Clear to auscultation bilaterally. No wheezing, rhonchi or  rales.  ABDOMEN:  Soft, nontender.  EXTREMITIES:  Without edema. Calves soft, nontender.  SKIN:  Warm and dry.  NEUROLOGIC:  He is alert and oriented x3. Cranial nerves II-XII grossly  intact.   Electrocardiogram  reveals atrial fibrillation with a heart rate of 76,  no acute changes.   IMPRESSION:  1. Labile hypertension - controlled today.  2. Coronary artery disease status post coronary artery bypass graft in      2000.      a.     Adenosine Myoview March 2006, ejection fraction 56%, no       ischemia.  3. Treated dyslipidemia.  4. Permanent atrial fibrillation.      a.     Chronic Coumadin therapy.  5. History of paroxysmal atrial flutter status post cardioversion and      status post ablation in 2001.   PLAN:  The patient has had fluctuations in his blood pressure since last  week. He is not having symptoms of angina. I discussed the patient's  case today with Dr. Eden Emms who also saw the patient. We have elected to  go ahead and get renal arterial Dopplers. Will also get a head CT since  he is on  Coumadin and had high blood pressures and dizziness. This will  be without contrast to rule out bleed. The patient will followup with  Dr. Daleen Squibb in the next couple of weeks.   ADDENDUM   We did give Eric [name] a  prescription for Lisinopril 10 mg daily p.r.n.  He as instructed to take this if his blood pressure shot up like it had  in previous days.  In looking through the patient's chart, it was noted  that he has ALTACE LISTED AS AN ALLERGY.  The notes in the chart  indicate that Eric [name] had some tachy palpitations when he was placed  on ALTACE back in 2000.  There is no documented allergy in the records.  I will be in touch with Eric [name] by telephone to confirm this.  If he  did have a true allergy, we will tell him to discontinue the Lisinopril  and suggest another medication to take on a p.r.n. basis.   I actually spoke to Eric [name].  He notes a cough with Altace in the  past.  He can take the lisinopril prn.  If he needs to start on a new,  daily anti-hypertensive, we should try an ARB instead of an ACE  inhibitor.      [name], PA-C  Electronically Signed      Noralyn Pick. Eden Emms, MD, [facility]  Electronically Signed   SW/MedQ  DD: 07/30/2006  DT: 07/31/2006  Job #: [id number]   cc:   Arta Silence, MD

## 2010-12-26 NOTE — Assessment & Plan Note (Signed)
[facility] [place] [facility]                            CARDIOLOGY OFFICE NOTE   Eric [name], Eric [name]                      MRN:          [id number]  DATE:08/20/2006                            DOB:          May 15, 1928    Eric [name] returns today for followup of his labile hypertension.  Please see the note from July 30, 2006.   PROBLEM LIST:  1. Coronary artery disease, status post coronary artery bypass      grafting in 2000.  Adenosine Myoview in March 2006 EF 56% with no      ischemia.  2. Hyperlipidemia, followed by Dr. Hetty Ely.  3. Labile hypertension, much better today.  4. Permanent atrial fib, chronic Coumadin therapy.  5. History of paroxysmal atrial flutter, status post cardioversion and      status post ablation 2001.   Tereso [name] obtained renal Dopplers, which were normal.  His aorta was  normal as well.  He had a head CT which was negative.  This was because  of some dizziness and headache.   His blood pressure has been good since then.   MEDICATIONS:  1. Metoprolol 100 b.i.d.  2. Warfarin.  3. Aspirin 81 mg a day.  4. Multivitamin.  5. Fish oil.  6. Hydrochlorothiazide 12.5 daily.  7. Lovastatin 40 mg two tabs daily.   He was given lisinopril, but has not taken it.  ALTACE CAUSES A COUGH.   PHYSICAL EXAMINATION:  His blood pressure today is 110/70, his pulse is  80 and regular.  His weight is 198.  HEENT:  Normocephalic, atraumatic.  PERRLA, extraocular movements  intact.  Skin is slightly ruddy.  Carotid upstrokes are equal bilaterally without bruits, no JVD.  The  thyroid is not enlarged.  Trachea is midline.  LUNGS:  Clear.  HEART:  Reveals a soft S1, S2.  ABDOMEN:  Exam is soft with good bowel sounds.  EXTREMITIES:  Reveal no edema.  Pulses are present.  NEUROLOGIC:  Exam is intact.   ASSESSMENT AND PLAN:  Eric [name] labile hypertension is now well  controlled.  His renal ultrasound and Dopplers were normal.  His head CT  was negative.   I made no change in his program today.  We will plan on seeing him back  again in 6 months.     Thomas C. Daleen Squibb, MD, [facility]  Electronically Signed    TCW/MedQ  DD: 08/20/2006  DT: 08/21/2006  Job #: [id number]   cc:   Arta Silence, MD

## 2010-12-26 NOTE — H&P (Signed)
NAMEELIBERTO, [name]               ACCOUNT NO.:  [id number]   MEDICAL RECORD NO.:  [id number]          PATIENT TYPE:  INP   LOCATION:  1844                         FACILITY:  [facility]   PHYSICIAN:  Theodore Demark, P.A. LHCDATE OF BIRTH:  [birth year]   DATE OF ADMISSION:  12/10/2005  DATE OF DISCHARGE:                                HISTORY & PHYSICAL   PRIMARY CARE PHYSICIAN:  Dr. Roxy Manns   PRIMARY CARDIOLOGIST:  Dr. Valera Castle   CHIEF COMPLAINT:  Abdominal pain and palpitations.   HISTORY OF PRESENT ILLNESS:  Eric [name] is a 77 year old male with a history  of coronary artery disease as well as atrial flutter.  He complains of  abdominal pain which started a little bit over 48 hours ago.  He thought he  had a irritated stomach and stated his stools were a little more loose than  usual but did not have diarrhea.  The pain became slightly worse yesterday  and he still having it today.  He went to his primary physician's office and  he was noted to be in atrial flutter.  He was seen by Dr. Clelia Croft who sent him  to [facility] ER by EMS.   Eric [name] describes the abdominal pain as a pressure.  He states that it  starts about his umbilicus  and radiates up towards the subxiphoid area.  He  states he also noticed palpitations but he did not have any shortness of  breath, dizziness or chest pain.  He has had these symptoms before and each  time he ended up getting cardioverted.  Until 2 days ago he was in his usual  state of health of having the symptoms.  He also does complain of some  dysuria and burning with urination.   PAST MEDICAL HISTORY:  1.  Status post aortocoronary bypass surgery in 2000 with LIMA to LAD, SVG      to diagonal and SVG to RCA.  2.  Status post cardiac catheterization in 2003 with cutting balloon PTCA to      the LAD, LIMA to LAD patent, but SVG to RCA and SVG to diagonal in      total.  3.  History of atrial flutter, status post ablation 2001.  4.  Status  post atrial flutter with direct current cardioversion in 2004 as      well as 2005.  5.  Treadmill Myoview in 2006 showing no ischemia and an ejection fraction      of 56%.  6.  Hypertension.  7.  Hyperlipidemia.  8.  History thrombocytopenia on ReoPro.  9.  Chronic anticoagulation with Coumadin.  10. History of PTCA and in ______________ 1999 prior to bypass surgery.  11. Hiatal hernia, possible reflux symptoms.   SURGICAL HISTORY:  Status post radiofrequency catheter ablation of atrial  flutter as well as cardiac catheterizations and bypass surgery.   ALLERGIES:  QUINIDINE AND REOPRO   MEDICATIONS:  1.  Tricor 48 mg a day.  2.  HCTZ 12.5 mg a day.  3.  Metoprolol 1 mg b.i.d.  4.  Vytorin 10/20  daily.  5.  Coumadin 5 mg 1/2 tablet daily.   SOCIAL HISTORY:  He lives in [place] with his wife.  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  He does not  abuse alcohol, tobacco or drugs.   FAMILY HISTORY:  His  mother died at age 36 with a history of arrhythmia but  no heart disease.  His father died at age 105 of a CVA, also had a history of  heart disease.  Two of his four brothers have a history of heart disease.   REVIEW OF SYSTEMS:  He has had a slight fever and chills today.  Dysuria and  abdominal pain as described above.  He has  occasional arthralgias.  He denies chest pain, shortness of breath,  coughing, wheezing.  Review of systems is otherwise negative.   PHYSICAL EXAMINATION:  VITAL SIGNS:  Temperature is 100.7 with a blood  pressure of 132/89, heart rate 113, respiratory rate 20, O2 saturation 100%  on three liters.  GENERAL:  He is a well-developed, elderly white male in no acute distress.  HEENT:  Head is normocephalic and atraumatic with pupils equal, round, and  react to light and accommodation.  Extraocular movements intact.  Sclera  clear.  Nares without discharge.  NECK:  There is no lymphadenopathy, thyromegaly,  bruit or JVD noted.  CARDIOVASCULAR:  Irregular rate and rhythm with an S1-S3 and no murmur, rub  or gallop is noted.  Distal pulses are 2+ in all her extremities and no  bruits are appreciated.  LUNGS:  Clear to auscultation bilaterally.  SKIN:  No rashes or lesions are noted.  ABDOMEN:  Is soft and has active bowel sounds and there is slight diffuse  tenderness.  EXTREMITIES:  There is no cyanosis, clubbing or edema.  MUSCULOSKELETAL:  There is no joint deformity or effusion. No spine or CVA  tenderness.  NEURO:  He is alert and oriented with cranial nerves II-XII  grossly intact.   CHEST X-RAY:  And labs are pending at the time of dictation.   EKG is atrial flutter rate 109 with no acute ischemic changes.   IMPRESSION:  1.  Recurrent atrial flutter with abdominal discomfort:  He also may have a      urinary tract infection or an upper respiratory tract infection.  He is      also chronically anticoagulated with Coumadin.  Plan will be to continue      his current medications.  Will check his INR and if his INR is greater      than 2.0, he will get a direct current cardioversion.  If his INR is      less than 2.0 and has not been greater than 2.0 for the last 4-6 weeks,      he will be scheduled for an TEE cardioversion.  We will also check a      chest x-ray and CBC for white count and urinalysis      with culture.  We will check liver function tests as well as an amylase      and lipase.  Will be followed closely while he is in the hospital.  At      this time has heart rate is generally less than 110.  Will continue him      on his prior dose of beta blockers with IV Cardizem p.r.n. for poorly      controlled heart rate.  Theodore Demark, P.A. [facility]     RB/MEDQ  D:  12/10/2005  T:  12/10/2005  Job:  [id number]   cc:   Marne A. Tower, M.D. [facility]  [address], [place]  [place] [address]

## 2010-12-26 NOTE — Discharge Summary (Signed)
[place]  Patient:    Eric [name], Eric [name] Visit Number: [id number] MRN: [id number]          Service Type: MED Location: [id number] Attending Physician:  Mirian Mo Dictated by:   Rozell Searing, P.A. Admit Date:  11/29/2001 Disc. Date: 12/06/01   CC:         Roxy Manns, M.D. [facility]   Referring Physician Discharge [place]  REASON FOR ADMISSION:  Please refer to dictated admission note.  LABORATORY DATA:  Cardiac enzymes:  CPK/MB negative x3; troponin I 0.01 (x2), less than 0.01.  Lipid profile:  Total cholesterol 148, triglycerides 215, HDL 39, LDL 66 (ratio 3.8).  TSH 1.741.  INR 3.7 on admission - 1.1 at discharge. WBC 9.5, HGB 12.8, HCT 36.7, platelets 167 at discharge.  Sodium 142, potassium 4.2, glucose 121, BUN 13, creatinine 1.1 at discharge.  Admission CXR:  NAD.  HOSPITAL COURSE:  The patient was admitted directly from the office for further evaluation of progressive dyspnea in the setting of previous bypass surgery and history of atrial flutter on chronic Coumadin.  Serial cardiac enzymes were negative for myocardial infarction.  Patient was treated with vitamin K for reversal of therapeutic INR.  Patient remained in NSR during his hospital stay save for a brief episode of paroxysmal atrial flutter following his catheterization.  He reverted back to NSR by time of discharge.  Coronary angiography performed December 02, 2001 by Dr. Riley Kill (see report for full details) notable for total occlusion of the SVG-DX and SVG-RCA grafts with 50% ostial LIMA-LAD graft and normal left ventricular function. Regarding the native arteries, there was a 75/80% LAD lesion at the bifurcation of the diagonal and septal perforator.  Dr. Riley Kill declined to use a drug eluting stent which would require treatment with Plavix for three months, given that the patient is on Coumadin.  He therefore proceeded with cutting balloon angioplasty of the lesion  with reduction to 20% residual stenosis.  He did have some transient paroxysmal atrial flutter and Coumadin was resumed.  As noted earlier, the patient had reverted back to NSR by time of discharge.  Dr. Riley Kill also recommended placement on Plavix for five to six days.  MEDICATIONS AT DISCHARGE: 1. Plavix 75 mg q.d. (x7 days). 2. Coated aspirin 81 mg q.d. 3. Coumadin as previously directed. 4. Tricor 160 mg q.d. 5. Lipitor 10 mg q.d. 6. Lopressor 75 mg b.i.d. 7. Multivitamin. 8. Nitrostat 0.4 mg p.r.n.  INSTRUCTIONS:  ACTIVITY:  No heavy lifting/driving x 2 days.  WOUND CARE:  Call the office if there is any swelling/bleeding of the groin.  DIET:  Low fat/cholesterol diet.  FOLLOW-UP:  Patient is scheduled to return for a protime check to the [facility] on Monday (Dec 12, 2001) at 12:45 p.m.  Patient is scheduled to follow up with Dr. Elijah Birk [facility] Clinic on Tuesday, Dec 20, 2001 at 10:30 a.m.  DISCHARGE DIAGNOSES: 1. Coronary artery disease progression.    a. Status post "cutting balloon" percutaneous transluminal coronary       angioplasty of 80% diagonal - December 05, 2001.    b. Residual 100% occlusion of saphenous vein to diagonal and saphenous       vein to right coronary artery grafts; patent left internal mammary       artery to left anterior descending artery graft with 50% ostial       stenosis (question spasm).    c. Normal left ventricular function.  d. Status post coronary artery bypass grafting in 2000. 2. Recurrent paroxysmal atrial flutter.    Chronic Coumadin. 3. History of thrombocytopenia.    On ReoPro. 4. Dislipidemia. 5. Hypertension. Dictated by:   Rozell Searing, P.A. Attending Physician:  Mirian Mo DD:  12/06/01 TD:  12/06/01 Job: [id number]

## 2010-12-26 NOTE — Cardiovascular Report (Signed)
[place]. [facility]  Patient:    Eric [name], Eric [name] Visit Number: [id number] MRN: [id number]          Service Type: MED Location: [phone] Attending Physician:  Mirian Mo Dictated by:   Arturo Morton Riley Kill, M.D. [facility] Proc. Date: 12/02/01 Admit Date:  11/29/2001   CC:         Cardiac Catheterization Lab  Eric [name]. Daleen Squibb, M.D. [facility]  Roxy Manns, M.D. [facility]   Cardiac Catheterization  INDICATIONS:  [name] is a delightful 77 year old who has had an extensive prior cardiac history.  He underwent percutaneous coronary rotational atherectomy in January 1999 and subsequently had restenosis requiring repeat intervention.  When he developed restenosis for the third time, he subsequently underwent revascularization surgery with an internal mammary placed to the LAD, a saphenous vein graft to a nondominant right coronary, and saphenous vein graft to a diagonal branch.  He has recently developed recurrent symptoms and findings have been suggestive of recurrent ischemia. Because of atrial flutter, he was on Coumadin and he was given vitamin K by Dr. Daleen Squibb to reverse this.  He was subsequently brought to the lab for further evaluation.  PROCEDURES: 1. Left heart catheterization. 2. Selective coronary arteriography. 3. Selective left ventriculography. 4. Saphenous vein graft angiography x2. 5. Selective left internal mammary artery angiography x1.  DESCRIPTION OF PROCEDURE:  The procedure was performed from the right femoral artery using #6 French catheters.  He tolerated the procedure well and there were no complications.  Because of elevated blood pressure at completion, he was given 1.25 mg of intravenous enalapril.  He was taken to the holding area in satisfactory clinical condition.  HEMODYNAMIC DATA: 1. The initial central aortic pressure was 136/78. 2. Left ventricular pressure was 159/22. 3. There is no gradient on pullback across the aortic  valve.  ANGIOGRAPHIC DATA: 1. Ventriculography was performed in the RAO projection.  Overall systolic    function was well preserved.  No definite segmental wall motion    abnormalities were noted.  Ejection fraction appeared to be well    preserved.  There was 1+ mitral regurgitation.  2. The aortic root was injected to attempt to identify the grafts and to    make sure that we were not missing any graft with selective injection.    Both rings appeared to be occluded.  There was no significant aortic    regurgitation.  No evidence of dissection noted.  3. The left main coronary artery was free of critical disease.  4. The LAD was totally occluded after the origin of the large diagonal.    Leading into the diagonal, there was an 80% LAD stenosis as has been    present on the previous studies at the bifurcation.  This involves the    diagonal branch.  The diagonal branch beyond this is modest in size.  It    would measure about a 2.5-mm artery angiographically.  The LAD proper is    occluded.  5. The internal mammary of the distal LAD is patent.  There is some narrowing    right at the ostium which, with the catheter in place, appeared to be about    50-60% but slightly less when the subselective injection suggesting that    there was some ostial spasm at the catheter tip.  There was excellent flow    into the distal LAD.  6. The saphenous vein graft to the diagonal branch was totally occluded at  its    ostium.  There is a small ramus intermedius with about 50% mid narrowing.  7. The circumflex proper was a dominant vessel that, in the main vessel, was    free of critical disease.  There was a distal marginal branch with about    50% ostial narrowing.  In the PDA which was relatively small, had about    70% mid narrowing and was segmentally diseased.  8. The right coronary artery was a dominant vessel with some ostial spasm and    then a mid 80% stenosis.  9. The saphenous vein  graft to the right coronary was occluded.  CONCLUSIONS: 1. Preserved overall left ventricular function with mild mitral regurgitation. 2. No evidence of aortic dissection with evidence of occluded vein grafts. 3. Occlusion of the saphenous vein graft to the diagonal. 4. Occlusion of the saphenous vein graft to the nondominant right. 5. Patent internal mammary to the distal left anterior descending artery with    probable ostial spasm but 50% ostial stenosis cannot be excluded.  DISPOSITION:  I plan to review with my colleagues.  My leaning is in the direction of considering percutaneous intervention of the LAD leading into the diagonal as this is the only remaining vessel off of the LAD proper.  We would like to attempt this with a drug-eluting stent.  ADDENDUM:  Aortic root aortography was also performed. Dictated by:   Arturo Morton Riley Kill, M.D. [facility] Attending Physician:  Mirian Mo DD:  12/02/01 TD:  12/02/01 Job: [id number]

## 2010-12-26 NOTE — Assessment & Plan Note (Signed)
[name]                         GASTROENTEROLOGY OFFICE NOTE   YAMIN, SWINGLER                      MRN:          [id number]  DATE:09/21/2006                            DOB:          Apr 03, 1928    REFERRING PHYSICIAN:  Arta Silence, MD   REASON FOR REFERRAL:  Dr. Hetty Ely asked me to evaluate [name] in  consultation regarding colorectal cancer screening.  He is currently  taking Coumadin for atrial fibrillation.   HISTORY OF PRESENT ILLNESS:  Eric [name] is a very pleasant 77 year old  man who had a colonoscopy 2 to 3 decades ago.  He remembers that it did  not show anything at that point.  He has had no troubles with his  bowels, including no constipation, no diarrhea, no bleeding.  He has not  had a repeat colorectal cancer screening since then.   REVIEW OF SYSTEMS:  Notable for stable weight.  Otherwise, essentially  normal and is available on his nursing intake sheet.   PAST MEDICAL HISTORY:  Coronary artery disease with a CABG in 2000.  Intermittent a fib currently on Coumadin, elevated cholesterol, kidney  stones.   CURRENT MEDICATIONS:  Metoprolol.  Coumadin.  Aspirin.  Multivitamin.  Hydrochlorothiazide.  Fish oil.  Lovastatin.   ALLERGIES:  ALTACE, __________, QUINIDINE.   SOCIAL HISTORY:  Married with 5 children.  Nonsmoker.  Nondrinker.   FAMILY HISTORY:  His son recently was told that he had colon polyps.  No  colon cancer in family.   PHYSICAL EXAM:  Height 5 feet 5 inches, weight 198 pounds.  Blood  pressure 140/82, pulse 54.  CONSTITUTIONAL:  Generally well-appearing.  NEUROLOGIC:  Alert and oriented x3.  EYES:  Extraocular muscles are intact.  MOUTH:  Oropharynx moist with no lesions.  NECK:  Supple.  No lymphadenopathy.  CARDIOVASCULAR:  Heart regular rate and rhythm.  LUNGS:  Clear to auscultation bilaterally.  ABDOMEN:  Soft and nontender.  Nondistended.  Normal bowel sounds.  EXTREMITIES:  No lower  extremity edema.  SKIN:  No rash or lesions on his lower extremities.   ASSESSMENT AND PLAN:  A 77 year old man at slightly elevated risk of  colon cancer with a family history of colon polyps, at elevated risk for  procedural complications given his ongoing Coumadin use.   I will arrange for Eric [name] to have a colonoscopy at his soonest  convenience.  He will hold his Coumadin for 5 days prior to then.  We  discussed the risks and benefits of the procedure and he understands and  wishes to proceed.  I seen no reason for any further blood tests or  imaging studies prior to then.  I did not note it above, but he has had  recent CBC 2 weeks ago and it was normal.  He also had a complete  metabolic profile and that was normal as well.     Rachael Fee, MD  Electronically Signed    DPJ/MedQ  DD: 09/21/2006  DT: 09/21/2006  Job #: [id number]   cc:   Arta Silence, MD

## 2011-01-20 ENCOUNTER — Ambulatory Visit (INDEPENDENT_AMBULATORY_CARE_PROVIDER_SITE_OTHER): Payer: Medicare Other | Admitting: *Deleted

## 2011-01-20 DIAGNOSIS — I4892 Unspecified atrial flutter: Secondary | ICD-10-CM

## 2011-01-20 DIAGNOSIS — I4891 Unspecified atrial fibrillation: Secondary | ICD-10-CM

## 2011-01-20 LAB — POCT INR: INR: 2.7

## 2011-02-09 ENCOUNTER — Telehealth: Payer: Self-pay | Admitting: Cardiology

## 2011-02-09 NOTE — Telephone Encounter (Signed)
Pt needs refill HTCZ 12.5mg  qd sent to [facility]

## 2011-02-10 MED ORDER — HYDROCHLOROTHIAZIDE 12.5 MG PO CAPS: 12.5000 mg | ORAL_CAPSULE | Freq: Every day | ORAL | Status: DC

## 2011-02-13 ENCOUNTER — Encounter: Payer: Self-pay | Admitting: Family Medicine

## 2011-02-17 ENCOUNTER — Ambulatory Visit (INDEPENDENT_AMBULATORY_CARE_PROVIDER_SITE_OTHER): Payer: Medicare Other | Admitting: *Deleted

## 2011-02-17 DIAGNOSIS — I4892 Unspecified atrial flutter: Secondary | ICD-10-CM

## 2011-02-17 DIAGNOSIS — I4891 Unspecified atrial fibrillation: Secondary | ICD-10-CM

## 2011-02-18 ENCOUNTER — Other Ambulatory Visit: Payer: Self-pay | Admitting: Family Medicine

## 2011-02-18 DIAGNOSIS — E781 Pure hyperglyceridemia: Secondary | ICD-10-CM

## 2011-02-18 DIAGNOSIS — G2589 Other specified extrapyramidal and movement disorders: Secondary | ICD-10-CM

## 2011-02-18 DIAGNOSIS — E538 Deficiency of other specified B group vitamins: Secondary | ICD-10-CM

## 2011-02-18 DIAGNOSIS — I4891 Unspecified atrial fibrillation: Secondary | ICD-10-CM

## 2011-02-18 DIAGNOSIS — E119 Type 2 diabetes mellitus without complications: Secondary | ICD-10-CM

## 2011-02-19 ENCOUNTER — Other Ambulatory Visit (INDEPENDENT_AMBULATORY_CARE_PROVIDER_SITE_OTHER): Payer: Medicare Other | Admitting: Family Medicine

## 2011-02-19 DIAGNOSIS — G2589 Other specified extrapyramidal and movement disorders: Secondary | ICD-10-CM

## 2011-02-19 DIAGNOSIS — E119 Type 2 diabetes mellitus without complications: Secondary | ICD-10-CM

## 2011-02-19 DIAGNOSIS — E781 Pure hyperglyceridemia: Secondary | ICD-10-CM

## 2011-02-19 DIAGNOSIS — I4891 Unspecified atrial fibrillation: Secondary | ICD-10-CM

## 2011-02-19 DIAGNOSIS — E538 Deficiency of other specified B group vitamins: Secondary | ICD-10-CM

## 2011-02-19 LAB — RENAL FUNCTION PANEL
Albumin: 4.2 g/dL (ref 3.5–5.2)
Calcium: 9.4 mg/dL (ref 8.4–10.5)
Chloride: 103 mEq/L (ref 96–112)
Potassium: 5.1 mEq/L (ref 3.5–5.1)
Sodium: 139 mEq/L (ref 135–145)

## 2011-02-19 LAB — LIPID PANEL
Total CHOL/HDL Ratio: 5
Triglycerides: 208 mg/dL — ABNORMAL HIGH (ref 0.0–149.0)
VLDL: 41.6 mg/dL — ABNORMAL HIGH (ref 0.0–40.0)

## 2011-02-19 LAB — HEPATIC FUNCTION PANEL
ALT: 23 U/L (ref 0–53)
AST: 33 U/L (ref 0–37)
Bilirubin, Direct: 0 mg/dL (ref 0.0–0.3)
Total Bilirubin: 0.5 mg/dL (ref 0.3–1.2)

## 2011-02-19 LAB — CBC WITH DIFFERENTIAL/PLATELET
Basophils Absolute: 0 10*3/uL (ref 0.0–0.1)
Eosinophils Absolute: 0.2 10*3/uL (ref 0.0–0.7)
HCT: 39.7 % (ref 39.0–52.0)
Hemoglobin: 13.4 g/dL (ref 13.0–17.0)
Lymphs Abs: 1.8 10*3/uL (ref 0.7–4.0)
MCHC: 33.6 g/dL (ref 30.0–36.0)
MCV: 93.2 fl (ref 78.0–100.0)
Monocytes Absolute: 0.6 10*3/uL (ref 0.1–1.0)
Neutro Abs: 4.8 10*3/uL (ref 1.4–7.7)
RDW: 16.1 % — ABNORMAL HIGH (ref 11.5–14.6)

## 2011-02-19 LAB — TSH: TSH: 1.09 u[IU]/mL (ref 0.35–5.50)

## 2011-02-19 LAB — LDL CHOLESTEROL, DIRECT: Direct LDL: 99.1 mg/dL

## 2011-02-19 LAB — MAGNESIUM: Magnesium: 2.1 mg/dL (ref 1.5–2.5)

## 2011-02-25 ENCOUNTER — Encounter: Payer: Self-pay | Admitting: Family Medicine

## 2011-02-25 ENCOUNTER — Ambulatory Visit (INDEPENDENT_AMBULATORY_CARE_PROVIDER_SITE_OTHER): Payer: Medicare Other | Admitting: Family Medicine

## 2011-02-25 VITALS — BP 120/70 | HR 80 | Temp 97.0°F | Ht 66.5 in | Wt 180.5 lb

## 2011-02-25 DIAGNOSIS — E781 Pure hyperglyceridemia: Secondary | ICD-10-CM

## 2011-02-25 DIAGNOSIS — I10 Essential (primary) hypertension: Secondary | ICD-10-CM

## 2011-02-25 DIAGNOSIS — Z0000 Encounter for general adult medical examination without abnormal findings: Secondary | ICD-10-CM

## 2011-02-25 DIAGNOSIS — E538 Deficiency of other specified B group vitamins: Secondary | ICD-10-CM

## 2011-02-25 DIAGNOSIS — E119 Type 2 diabetes mellitus without complications: Secondary | ICD-10-CM

## 2011-02-25 DIAGNOSIS — N289 Disorder of kidney and ureter, unspecified: Secondary | ICD-10-CM

## 2011-02-25 DIAGNOSIS — G4730 Sleep apnea, unspecified: Secondary | ICD-10-CM

## 2011-02-25 DIAGNOSIS — I4891 Unspecified atrial fibrillation: Secondary | ICD-10-CM

## 2011-02-25 MED ORDER — FUROSEMIDE 20 MG PO TABS: 20.0000 mg | ORAL_TABLET | Freq: Every day | ORAL | Status: DC

## 2011-02-25 NOTE — Assessment & Plan Note (Signed)
Sounds regular today via auscultation. Per cardiology.

## 2011-02-25 NOTE — Assessment & Plan Note (Signed)
Slightly high. Avoid sweets and carbs.

## 2011-02-25 NOTE — Assessment & Plan Note (Signed)
Need sugar and BP as low as possible.  Will change HCTZ to Lasix and recheck in 3 mos. Push fluids, avoid dehydration.

## 2011-02-25 NOTE — Assessment & Plan Note (Signed)
Adequate  On the low side. CBC ok. Cont as is.

## 2011-02-25 NOTE — Patient Instructions (Addendum)
RTC 2 mos for recheck, Bmet prior 401.9 RTC 6 mos with Dr Para March, get acquainted.

## 2011-02-25 NOTE — Assessment & Plan Note (Addendum)
Adequate control. Feet look good. Will prescribe shoes. Monofilament positive, does not feel it. Shoes important and checking his feet nightly important. Kidney function off slightly . Will continue Lisinopril. Might be good to change HCTZ to Lasix. Script for Lasix sent in, when arrives stop HCTZ and start Lasix.

## 2011-02-25 NOTE — Progress Notes (Signed)
Subjective:    Patient ID: Eric [name], male    DOB: 1928-02-14, 77 y.o.   MRN: [id number]  HPI Pt is here for Comp Exam. He hasn't seen me in a while as he is seen by cardiology frequently and doesn't like going to the doctor in general so stays away from here! He has a form for diabetic shoes to be filled out. He has pain on the lateral side of the right foot and also on the heels. The diabetic shoes help but he has worn them out and is wearing tennis shoes today.  He has no otnher complaints and feels well.     Review of Systems  Constitutional: Negative for fever, chills, diaphoresis, appetite change, fatigue and unexpected weight change.  HENT: Positive for hearing loss. Negative for ear pain, tinnitus and ear discharge.        He has hearing aids, wears to church. Doesn't wear as a rule because in the heat he sweats and they do not stay in well.  Eyes: Negative for pain, discharge and visual disturbance.       Saw eye doctor three months ago.  Respiratory: Negative for cough, shortness of breath and wheezing.   Cardiovascular: Negative for chest pain and palpitations.       No SOB w/ exertion  Gastrointestinal: Positive for diarrhea (mild episode a few weeks ago, resolved spontaneously. ). Negative for nausea, vomiting, abdominal pain, constipation and blood in stool.       No heartburn or swallowing problems.  Genitourinary: Negative for dysuria, frequency and difficulty urinating.       Has some  Nocturia with some hesitancy.   Musculoskeletal: Negative for myalgias, back pain and arthralgias.  Skin: Negative for rash.       No itching or dryness.  Neurological: Negative for tremors and numbness.       No tingling or balance problems.  Hematological: Negative for adenopathy. Does not bruise/bleed easily.  Psychiatric/Behavioral: Negative for dysphoric mood and agitation.       Objective:   Physical Exam  Constitutional: He is oriented to person, place, and time. He  appears well-developed and well-nourished. No distress.  HENT:  Head: Normocephalic and atraumatic.  Right Ear: External ear normal.  Left Ear: External ear normal.  Nose: Nose normal.  Mouth/Throat: Oropharynx is clear and moist.  Eyes: Conjunctivae and EOM are normal. Pupils are equal, round, and reactive to light. Right eye exhibits no discharge. Left eye exhibits no discharge. No scleral icterus.  Neck: Normal range of motion. Neck supple. No thyromegaly present.  Cardiovascular: Normal rate, regular rhythm, normal heart sounds and intact distal pulses.   No murmur heard. Pulmonary/Chest: Effort normal and breath sounds normal. No respiratory distress. He has no wheezes.  Abdominal: Soft. Bowel sounds are normal. He exhibits no distension and no mass. There is no tenderness. There is no rebound and no guarding.  Genitourinary: Rectum normal, prostate normal and penis normal. Guaiac negative stool.  Musculoskeletal: Normal range of motion. He exhibits no edema.  Lymphadenopathy:    He has no cervical adenopathy.  Neurological: He is alert and oriented to person, place, and time. Coordination normal.  Skin: Skin is warm and dry. No rash noted. He is not diaphoretic.  Psychiatric: He has a normal mood and affect. His behavior is normal. Judgment and thought content normal.          Assessment & Plan:  HMPE    I have personally reviewed the Medicare  Annual Wellness questionnaire and have noted 1. The patient's medical and social history 2. Their use of alcohol, tobacco or illicit drugs 3. Their current medications and supplements 4. The patient's functional ability including ADL's, fall risks, home safety risks and hearing or visual             impairment. 5. Diet and physical activities 6. Evidence for depression or mood disorders

## 2011-02-25 NOTE — Assessment & Plan Note (Signed)
Good control. Cont curr meds. 

## 2011-02-25 NOTE — Assessment & Plan Note (Signed)
Encouraged to use CPAP. 

## 2011-03-10 ENCOUNTER — Other Ambulatory Visit: Payer: Self-pay | Admitting: *Deleted

## 2011-03-10 ENCOUNTER — Ambulatory Visit (INDEPENDENT_AMBULATORY_CARE_PROVIDER_SITE_OTHER): Payer: Medicare Other | Admitting: *Deleted

## 2011-03-10 ENCOUNTER — Telehealth: Payer: Self-pay | Admitting: Cardiology

## 2011-03-10 DIAGNOSIS — I4891 Unspecified atrial fibrillation: Secondary | ICD-10-CM

## 2011-03-10 DIAGNOSIS — I4892 Unspecified atrial flutter: Secondary | ICD-10-CM

## 2011-03-10 MED ORDER — WARFARIN SODIUM 5 MG PO TABS: ORAL_TABLET | ORAL | Status: DC

## 2011-03-10 MED ORDER — FENOFIBRATE 160 MG PO TABS: 160.0000 mg | ORAL_TABLET | Freq: Every day | ORAL | Status: DC

## 2011-03-10 MED ORDER — METOPROLOL TARTRATE 100 MG PO TABS: 100.0000 mg | ORAL_TABLET | Freq: Two times a day (BID) | ORAL | Status: DC

## 2011-03-10 MED ORDER — SIMVASTATIN 40 MG PO TABS: 40.0000 mg | ORAL_TABLET | Freq: Every day | ORAL | Status: DC

## 2011-03-10 MED ORDER — METFORMIN HCL 500 MG PO TABS: 500.0000 mg | ORAL_TABLET | Freq: Two times a day (BID) | ORAL | Status: DC

## 2011-03-10 NOTE — Telephone Encounter (Signed)
Rx sent electronically.  

## 2011-03-10 NOTE — Telephone Encounter (Signed)
Walk In Pt Form " Pt Needs Prescription Filled" sent to [facility]  03/10/11/km

## 2011-03-17 ENCOUNTER — Telehealth: Payer: Self-pay | Admitting: *Deleted

## 2011-03-17 NOTE — Telephone Encounter (Signed)
Please have pt come in to discuss

## 2011-03-17 NOTE — Telephone Encounter (Signed)
Concerns about Amlodipine and high dose of Simvastatin as a medication reaction.  Generic Lipitor is now available as a substitute.  Please advise.

## 2011-03-18 NOTE — Telephone Encounter (Signed)
Left message on machine for patient to call back.

## 2011-03-19 NOTE — Telephone Encounter (Signed)
Patient notified as instructed by telephone. Appointment scheduled. 

## 2011-03-25 ENCOUNTER — Ambulatory Visit (INDEPENDENT_AMBULATORY_CARE_PROVIDER_SITE_OTHER): Payer: Medicare Other | Admitting: Cardiology

## 2011-03-25 ENCOUNTER — Telehealth: Payer: Self-pay | Admitting: Cardiology

## 2011-03-25 ENCOUNTER — Encounter: Payer: Self-pay | Admitting: Cardiology

## 2011-03-25 VITALS — BP 116/69 | HR 64 | Resp 14 | Ht 65.0 in | Wt 177.0 lb

## 2011-03-25 DIAGNOSIS — I4891 Unspecified atrial fibrillation: Secondary | ICD-10-CM

## 2011-03-25 DIAGNOSIS — Z7901 Long term (current) use of anticoagulants: Secondary | ICD-10-CM

## 2011-03-25 DIAGNOSIS — E781 Pure hyperglyceridemia: Secondary | ICD-10-CM

## 2011-03-25 DIAGNOSIS — I2510 Atherosclerotic heart disease of native coronary artery without angina pectoris: Secondary | ICD-10-CM

## 2011-03-25 MED ORDER — ATORVASTATIN CALCIUM 20 MG PO TABS: 20.0000 mg | ORAL_TABLET | Freq: Every day | ORAL | Status: DC

## 2011-03-25 NOTE — Assessment & Plan Note (Signed)
On simvastatin and amlodipine. We'll change to atorvastatin 20 mg q.h.s. Check labs in 6 weeks.

## 2011-03-25 NOTE — Assessment & Plan Note (Signed)
Stable. No change in treatment. 

## 2011-03-25 NOTE — Patient Instructions (Signed)
Your physician wants you to follow-up in: 6 Months. You will receive a reminder letter in the mail two months in advance. If you don't receive a letter, please call our office to schedule the follow-up appointment.  Your physician has recommended you make the following change in your medication: STOP SIMVASTATIN.  Please begin 20 mg Atorvastatin (Lipitor) one tablet daily.Your physician recommends that you return for a FASTING lipid profile: 6 Weeks.

## 2011-03-25 NOTE — Telephone Encounter (Signed)
Per pt wife call, pt was in office today and Dr. Daleen Squibb changed pt medications due to drug interactions. Pt was put on Lipitor generic today. However pt wife said Lipitor generic is $72. Pt wife wanting to know if Dr. Daleen Squibb can write pt RX for some other medication that is cheaper. Please return pt wife call.

## 2011-03-25 NOTE — Progress Notes (Signed)
HPI Mr. Murphyreturns today for evaluation and management of his chronic A. Fib, anticoagulation, hypertension, and hyperlipidemia, and CAD.  He's having no symptoms of atrial fib. He has not had any more dizzy spells. His blood pressures but under good control.  She's had no problems with Coumadin.  I Noticed he is in amlodipine and simvastatin.  EKG shows atrial fib with a PVC. Past Medical History  Diagnosis Date  . CAD (coronary artery disease)     cath 2003, occluded S-RCA, occluded S-Dx, L-LAD ok, s/p PTCA to LAD  . Atrial fibrillation   . Atrial flutter     s/p RFCA  . Encounter for long-term (current) use of anticoagulants   . Pure hyperglyceridemia   . Unspecified essential hypertension   . Diabetes mellitus   . OSA (obstructive sleep apnea) 12/11    very mild, AHI 7/hr    Past Surgical History  Procedure Date  . Nm myoview ltd 4/11    normal  . Angioplasty 1/99    CAD- diogonal with rotational artherectomy  . Carpal tunnel release     ? bilateral  . Cardiac catheterization 1/00  . Arthrectomy     of LAD & PTCA  . Coronary artery bypass graft 2000  . Ptca 4/03    admit unstable angina  . Cardioversion 1/04  . Cardioversion 5/07    hospital- a flutter  . Adenosine myoview 3/06    EF 56%, neg. Ischemia  . Adenosine myoview 02/18/07    nml  . Hand surgery 08/27/09    R thumb procedure wit Scaphoid Gragt and screws, Dr Merlyn Lot  . Colonoscopy w/ biopsies 10/01/06    sigmoid polyp bx neg, 3 years    Family History  Problem Relation Age of Onset  . Stroke Father   . Aneurysm Father   . Hypertension Father   . Prostate cancer Brother   . Hypertension Mother   . Lung cancer Brother     smoker  . Thrombosis Brother   . Other Brother     RF valve disorder (smoker)  . Lung cancer Brother     smoker  . Other Sister     cerebral hemm  . Breast cancer Sister     History   Social History  . Marital Status: Married    Spouse Name: N/A    Number of  Children: 5  . Years of Education: N/A   Occupational History  . [employer]    Social History Main Topics  . Smoking status: Never Smoker   . Smokeless tobacco: Former Neurosurgeon  . Alcohol Use: No  . Drug Use: No  . Sexually Active: Yes   Other Topics Concern  . Not on file   Social History Narrative   Retired now does antiques    Allergies  Allergen Reactions  . Isosorbide Mononitrate     REACTION: unspecified  . Quinidine Gluconate     REACTION: unspecified  . Ramipril     REACTION: cough    Current Outpatient Prescriptions  Medication Sig Dispense Refill  . acetaminophen (TYLENOL) 500 MG tablet 2 tablets by mouth as needed pain       . amLODipine (NORVASC) 5 MG tablet Take 5 mg by mouth every evening.        [name] aspirin 81 MG tablet Take 81 mg by mouth daily.        . B Complex Vitamins (B COMPLEX-B12) TABS Take 1 tablet by mouth. 1000 mcg  daily       . cholecalciferol (VITAMIN D) 1000 UNITS tablet Take 1,000 Units by mouth 2 (two) times a week.        [name] CINNAMON PO Take by mouth daily.        . fenofibrate 160 MG tablet Take 1 tablet (160 mg total) by mouth daily.  30 tablet  11  . fish oil-omega-3 fatty acids 1000 MG capsule Take 2 g by mouth 3 (three) times daily.       . hydrochlorothiazide (,MICROZIDE/HYDRODIURIL,) 12.5 MG capsule Take 1 tablet by mouth Daily.      [name] lisinopril (PRINIVIL,ZESTRIL) 20 MG tablet Take 20 mg by mouth daily.        . metFORMIN (GLUCOPHAGE) 500 MG tablet Take 1 tablet (500 mg total) by mouth 2 (two) times daily with a meal.  60 tablet  11  . metoprolol (LOPRESSOR) 100 MG tablet Take 1 tablet (100 mg total) by mouth 2 (two) times daily.  60 tablet  11  . Multiple Vitamin (DAILY MULTIVITAMIN PO) Take 1 tablet by mouth daily.        [name] rOPINIRole (REQUIP) 0.5 MG tablet Take 0.5 mg by mouth daily after supper.        . simvastatin (ZOCOR) 40 MG tablet Take 1 tablet (40 mg total) by mouth at bedtime.  30 tablet  11  . warfarin (COUMADIN) 5 MG  tablet Take as directed by Anticoagulation clinic   90 tablet  1    ROS Negative other than HPI.   PE General Appearance: well developed, well nourished in no acute distress, overweight HEENT: symmetrical face, PERRLA, good dentition  Neck: no JVD, thyromegaly, or adenopathy, trachea midline Chest: symmetric without deformity Cardiac: PMI non-displaced, irregular rate and rhythm, normal S1, S2, no gallop , soft systolic murmur at the apexLung: clear to ausculation and percussion Vascular: decreased pulses in the lower extremities  Abdominal: nondistended, nontender, good bowel sounds, no HSM, no bruits Extremities: no cyanosis, clubbing or edema, no sign of DVT, no varicosities  Skin: normal color, no rashes Neuro: alert and oriented x 3, non-focal Pysch: normal affect Filed Vitals:   03/25/11 1159  BP: 116/69  Pulse: 64  Resp: 14  Height: 5\' 5"  (1.651 m)  Weight: 177 lb (80.287 kg)    EKG  Labs and Studies Reviewed.   Lab Results  Component Value Date   WBC 7.5 02/19/2011   HGB 13.4 02/19/2011   HCT 39.7 02/19/2011   MCV 93.2 02/19/2011   PLT 155.0 02/19/2011      Chemistry      Component Value Date/Time   NA 139 02/19/2011 0823   K 5.1 02/19/2011 0823   CL 103 02/19/2011 0823   CO2 26 02/19/2011 0823   BUN 19 02/19/2011 0823   CREATININE 1.6* 02/19/2011 0823      Component Value Date/Time   CALCIUM 9.4 02/19/2011 0823   ALKPHOS 24* 02/19/2011 0823   AST 33 02/19/2011 0823   ALT 23 02/19/2011 0823   BILITOT 0.5 02/19/2011 0823       Lab Results  Component Value Date   CHOL 152 02/19/2011   CHOL 159 12/20/2009   CHOL 175 09/20/2009   Lab Results  Component Value Date   HDL 31.20* 02/19/2011   HDL 29.50* 12/20/2009   HDL 34.20* 09/20/2009   Lab Results  Component Value Date   LDLCALC 93 12/20/2009   LDLCALC 99 04/05/2009   Lab Results  Component Value Date  TRIG 208.0* 02/19/2011   TRIG 185.0* 12/20/2009   TRIG 203.0* 09/20/2009   Lab Results  Component Value  Date   CHOLHDL 5 02/19/2011   CHOLHDL 5 12/20/2009   CHOLHDL 5 09/20/2009   Lab Results  Component Value Date   HGBA1C 6.6* 02/19/2011   Lab Results  Component Value Date   ALT 23 02/19/2011   AST 33 02/19/2011   ALKPHOS 24* 02/19/2011   BILITOT 0.5 02/19/2011   Lab Results  Component Value Date   TSH 1.09 02/19/2011

## 2011-03-26 ENCOUNTER — Ambulatory Visit: Payer: Medicare Other | Admitting: Family Medicine

## 2011-03-27 MED ORDER — SIMVASTATIN 20 MG PO TABS: 20.0000 mg | ORAL_TABLET | Freq: Every day | ORAL | Status: DC

## 2011-03-27 NOTE — Telephone Encounter (Signed)
I spoke with pt wife about the atorvastatin.  Dr. Daleen Squibb has reviewed and pt can decrease simvastatin to acceptable dose in conjunction with his amlodipine.  She is aware the cholesterol results may not be at optimum but due to the cost of the medication pt is willing to accept change. Simvastatin is $8.00/90day  And the atorvastatin is $78.00/90day. Pt does not need repeat cholesterol labs with this change and is aware. [name] RN

## 2011-04-07 ENCOUNTER — Encounter: Payer: Medicare Other | Admitting: *Deleted

## 2011-04-07 ENCOUNTER — Ambulatory Visit (INDEPENDENT_AMBULATORY_CARE_PROVIDER_SITE_OTHER): Payer: Medicare Other | Admitting: *Deleted

## 2011-04-07 DIAGNOSIS — I4891 Unspecified atrial fibrillation: Secondary | ICD-10-CM

## 2011-04-07 DIAGNOSIS — I4892 Unspecified atrial flutter: Secondary | ICD-10-CM

## 2011-04-07 LAB — POCT INR: INR: 2.1

## 2011-04-17 ENCOUNTER — Other Ambulatory Visit: Payer: Self-pay | Admitting: Family Medicine

## 2011-04-17 DIAGNOSIS — E119 Type 2 diabetes mellitus without complications: Secondary | ICD-10-CM

## 2011-04-21 ENCOUNTER — Other Ambulatory Visit (INDEPENDENT_AMBULATORY_CARE_PROVIDER_SITE_OTHER): Payer: Medicare Other

## 2011-04-21 DIAGNOSIS — E119 Type 2 diabetes mellitus without complications: Secondary | ICD-10-CM

## 2011-04-22 LAB — RENAL FUNCTION PANEL
Albumin: 4 g/dL (ref 3.5–5.2)
GFR: 32.02 mL/min — ABNORMAL LOW (ref >60.00)
Glucose, Bld: 100 mg/dL — ABNORMAL HIGH (ref 70–99)
Phosphorus: 3.8 mg/dL (ref 2.3–4.6)
Potassium: 4.9 mEq/L (ref 3.5–5.1)
Sodium: 138 mEq/L (ref 135–145)

## 2011-05-04 ENCOUNTER — Other Ambulatory Visit (INDEPENDENT_AMBULATORY_CARE_PROVIDER_SITE_OTHER): Payer: Medicare Other | Admitting: *Deleted

## 2011-05-04 ENCOUNTER — Ambulatory Visit (INDEPENDENT_AMBULATORY_CARE_PROVIDER_SITE_OTHER): Payer: Medicare Other | Admitting: *Deleted

## 2011-05-04 DIAGNOSIS — I2510 Atherosclerotic heart disease of native coronary artery without angina pectoris: Secondary | ICD-10-CM

## 2011-05-04 DIAGNOSIS — E781 Pure hyperglyceridemia: Secondary | ICD-10-CM

## 2011-05-04 DIAGNOSIS — I4891 Unspecified atrial fibrillation: Secondary | ICD-10-CM

## 2011-05-04 DIAGNOSIS — I4892 Unspecified atrial flutter: Secondary | ICD-10-CM

## 2011-05-04 LAB — HEPATIC FUNCTION PANEL
ALT: 23 U/L (ref 0–53)
Total Bilirubin: 0.7 mg/dL (ref 0.3–1.2)

## 2011-05-04 LAB — LIPID PANEL
Cholesterol: 155 mg/dL (ref 0–200)
LDL Cholesterol: 89 mg/dL (ref 0–99)
Triglycerides: 160 mg/dL — ABNORMAL HIGH (ref 0.0–149.0)

## 2011-05-04 LAB — POCT INR: INR: 2.3

## 2011-05-06 ENCOUNTER — Telehealth: Payer: Self-pay | Admitting: Cardiology

## 2011-05-06 ENCOUNTER — Encounter: Payer: Medicare Other | Admitting: *Deleted

## 2011-05-06 NOTE — Telephone Encounter (Signed)
Pt returning call to [facility].Pt will be out of town until Monday. Please return pt call then.

## 2011-05-06 NOTE — Telephone Encounter (Signed)
LMTCB Debbie Ricco Dershem RN  

## 2011-05-06 NOTE — Telephone Encounter (Signed)
Returning your call. °

## 2011-05-11 ENCOUNTER — Encounter: Payer: Self-pay | Admitting: *Deleted

## 2011-05-13 NOTE — Telephone Encounter (Signed)
LMOVM Copy mailed to pt. [name] RN

## 2011-06-01 ENCOUNTER — Ambulatory Visit (INDEPENDENT_AMBULATORY_CARE_PROVIDER_SITE_OTHER): Payer: Medicare Other | Admitting: *Deleted

## 2011-06-01 DIAGNOSIS — I4892 Unspecified atrial flutter: Secondary | ICD-10-CM

## 2011-06-01 DIAGNOSIS — Z7901 Long term (current) use of anticoagulants: Secondary | ICD-10-CM

## 2011-06-01 DIAGNOSIS — I4891 Unspecified atrial fibrillation: Secondary | ICD-10-CM

## 2011-06-22 ENCOUNTER — Ambulatory Visit (INDEPENDENT_AMBULATORY_CARE_PROVIDER_SITE_OTHER): Payer: Medicare Other | Admitting: *Deleted

## 2011-06-22 DIAGNOSIS — Z7901 Long term (current) use of anticoagulants: Secondary | ICD-10-CM

## 2011-06-22 DIAGNOSIS — I4891 Unspecified atrial fibrillation: Secondary | ICD-10-CM

## 2011-06-22 DIAGNOSIS — I4892 Unspecified atrial flutter: Secondary | ICD-10-CM

## 2011-06-22 LAB — POCT INR: INR: 2.8

## 2011-07-14 ENCOUNTER — Ambulatory Visit (INDEPENDENT_AMBULATORY_CARE_PROVIDER_SITE_OTHER): Payer: Medicare Other

## 2011-07-14 DIAGNOSIS — Z23 Encounter for immunization: Secondary | ICD-10-CM

## 2011-07-20 ENCOUNTER — Ambulatory Visit (INDEPENDENT_AMBULATORY_CARE_PROVIDER_SITE_OTHER): Payer: Medicare Other | Admitting: *Deleted

## 2011-07-20 DIAGNOSIS — Z7901 Long term (current) use of anticoagulants: Secondary | ICD-10-CM

## 2011-07-20 DIAGNOSIS — I4891 Unspecified atrial fibrillation: Secondary | ICD-10-CM

## 2011-07-20 DIAGNOSIS — I4892 Unspecified atrial flutter: Secondary | ICD-10-CM

## 2011-07-20 LAB — POCT INR: INR: 3.3

## 2011-08-10 ENCOUNTER — Ambulatory Visit (INDEPENDENT_AMBULATORY_CARE_PROVIDER_SITE_OTHER): Payer: Medicare Other | Admitting: *Deleted

## 2011-08-10 DIAGNOSIS — Z7901 Long term (current) use of anticoagulants: Secondary | ICD-10-CM

## 2011-08-10 DIAGNOSIS — I4892 Unspecified atrial flutter: Secondary | ICD-10-CM

## 2011-08-10 DIAGNOSIS — I4891 Unspecified atrial fibrillation: Secondary | ICD-10-CM

## 2011-08-25 ENCOUNTER — Ambulatory Visit (INDEPENDENT_AMBULATORY_CARE_PROVIDER_SITE_OTHER): Payer: Medicare Other | Admitting: Family Medicine

## 2011-08-25 ENCOUNTER — Encounter: Payer: Self-pay | Admitting: Family Medicine

## 2011-08-25 VITALS — BP 142/82 | HR 60 | Temp 97.4°F | Wt 179.8 lb

## 2011-08-25 DIAGNOSIS — E119 Type 2 diabetes mellitus without complications: Secondary | ICD-10-CM

## 2011-08-25 DIAGNOSIS — I10 Essential (primary) hypertension: Secondary | ICD-10-CM

## 2011-08-25 DIAGNOSIS — M79606 Pain in leg, unspecified: Secondary | ICD-10-CM

## 2011-08-25 DIAGNOSIS — M79609 Pain in unspecified limb: Secondary | ICD-10-CM

## 2011-08-25 DIAGNOSIS — I4891 Unspecified atrial fibrillation: Secondary | ICD-10-CM

## 2011-08-25 DIAGNOSIS — N183 Chronic kidney disease, stage 3 unspecified: Secondary | ICD-10-CM

## 2011-08-25 LAB — BASIC METABOLIC PANEL
BUN: 39 mg/dL — ABNORMAL HIGH (ref 6–23)
Calcium: 9.6 mg/dL (ref 8.4–10.5)
GFR: 34.82 mL/min — ABNORMAL LOW (ref >60.00)
Glucose, Bld: 111 mg/dL — ABNORMAL HIGH (ref 70–99)
Potassium: 5 mEq/L (ref 3.5–5.1)

## 2011-08-25 NOTE — Patient Instructions (Addendum)
Check with your insurance to see if they will cover the shingles shot. You can get your results through our phone system.  Follow the instructions on the blue card. Check your feet daily and let me know if you get a red spot or an ulcer.  Come back and see me in 3 months.  30 min visit.  Check labs ahead of time.   Stop the metformin for now.

## 2011-08-25 NOTE — Progress Notes (Signed)
He had some R knee and shin pain, skin was itchy and sensitive over last week but is getting better now.  No fevers.  No sx on L leg.    Diabetes:  Using medications without difficulties:yes Hypoglycemic episodes: yes Hyperglycemic episodes:no Feet problems: no Blood Sugars averaging: ~100 eye exam within last year: yes  Afib Aflutter.  Anticoag per cards.  BP has been controlled usually, occ higher in AM.  Recently it has been controlled better. No edema. No CP.  No heart racing.    Elevated Cr.  Due for BMET.  Prev notes reviewed and d/w pt.   PMH and SH reviewed  Meds, vitals, and allergies reviewed.   ROS: See HPI.  Otherwise negative.    GEN: nad, alert and oriented HEENT: mucous membranes moist NECK: supple w/o LA CV: IRR PULM: ctab, no inc wob ABD: soft, +bs EXT: no edema SKIN: no acute rash  R knee with flaky skin and some mild erythema  (it looks like a resolving bursitis).  rom okay.  Can weight bear.   Diabetic foot exam: Normal inspection No skin breakdown calluses noted 1+ DP pulses no sensation to light touch and monofilament on feet Nails onychomycotic

## 2011-08-26 ENCOUNTER — Telehealth: Payer: Self-pay | Admitting: Family Medicine

## 2011-08-26 DIAGNOSIS — N179 Acute kidney failure, unspecified: Secondary | ICD-10-CM | POA: Insufficient documentation

## 2011-08-26 DIAGNOSIS — M79606 Pain in leg, unspecified: Secondary | ICD-10-CM | POA: Insufficient documentation

## 2011-08-26 DIAGNOSIS — N183 Chronic kidney disease, stage 3 unspecified: Secondary | ICD-10-CM

## 2011-08-26 NOTE — Assessment & Plan Note (Signed)
Stop metformin, recheck A1c in 3 months. D/wpt about foot care, diet.

## 2011-08-26 NOTE — Telephone Encounter (Signed)
Patient notified as instructed by telephone. Follow-up appointments already scheduled. Advised patient that Shirlee Limerick will be in touch with him regarding the referral to the renal clinic.

## 2011-08-26 NOTE — Assessment & Plan Note (Addendum)
A resolving bursitis and I don't think it's infected.  Observe for now and call back as needed.

## 2011-08-26 NOTE — Assessment & Plan Note (Signed)
Stop metfomin, refer to renal

## 2011-08-26 NOTE — Assessment & Plan Note (Signed)
No change in meds today

## 2011-08-26 NOTE — Telephone Encounter (Signed)
Please call pt.  I want him off the metformin totally.  We should recheck his sugar before and OV in 3 months.  In the meantime, I'd like him to see the renal clinic.  His kidney function had dropped off some during the last year.  It's about the same level as when [place] last checked it.  He isn't near the point of dialysis, but I want to make sure this doesn't get worse (so I'd like their input).  Orders are in.  Thanks.

## 2011-08-26 NOTE — Assessment & Plan Note (Signed)
Per cards  

## 2011-09-01 ENCOUNTER — Other Ambulatory Visit: Payer: Self-pay | Admitting: *Deleted

## 2011-09-01 MED ORDER — FUROSEMIDE 20 MG PO TABS: 20.0000 mg | ORAL_TABLET | Freq: Every day | ORAL | Status: DC

## 2011-09-04 ENCOUNTER — Other Ambulatory Visit: Payer: Self-pay | Admitting: Family Medicine

## 2011-09-04 NOTE — Telephone Encounter (Signed)
New pharmacy program and refills need to be sent to [facility].  Call back is [phone].  Thanks

## 2011-09-05 MED ORDER — METOPROLOL TARTRATE 100 MG PO TABS: 100.0000 mg | ORAL_TABLET | Freq: Two times a day (BID) | ORAL | Status: DC

## 2011-09-05 MED ORDER — FENOFIBRATE 160 MG PO TABS: 160.0000 mg | ORAL_TABLET | Freq: Every day | ORAL | Status: DC

## 2011-09-05 MED ORDER — LISINOPRIL 20 MG PO TABS: 20.0000 mg | ORAL_TABLET | Freq: Every day | ORAL | Status: DC

## 2011-09-05 MED ORDER — WARFARIN SODIUM 5 MG PO TABS: ORAL_TABLET | ORAL | Status: DC

## 2011-09-05 MED ORDER — FUROSEMIDE 20 MG PO TABS: 20.0000 mg | ORAL_TABLET | Freq: Every day | ORAL | Status: DC

## 2011-09-05 MED ORDER — AMLODIPINE BESYLATE 5 MG PO TABS: 5.0000 mg | ORAL_TABLET | Freq: Every evening | ORAL | Status: DC

## 2011-09-05 MED ORDER — ATORVASTATIN CALCIUM 20 MG PO TABS: 20.0000 mg | ORAL_TABLET | Freq: Every day | ORAL | Status: DC

## 2011-09-05 NOTE — Telephone Encounter (Signed)
I can't get these to go through electronically.  I've printed them.  Please give to patient.  Thanks.

## 2011-09-07 ENCOUNTER — Other Ambulatory Visit: Payer: Self-pay | Admitting: *Deleted

## 2011-09-07 ENCOUNTER — Ambulatory Visit (INDEPENDENT_AMBULATORY_CARE_PROVIDER_SITE_OTHER): Payer: Medicare Other

## 2011-09-07 DIAGNOSIS — I4891 Unspecified atrial fibrillation: Secondary | ICD-10-CM

## 2011-09-07 DIAGNOSIS — I4892 Unspecified atrial flutter: Secondary | ICD-10-CM

## 2011-09-07 DIAGNOSIS — Z7901 Long term (current) use of anticoagulants: Secondary | ICD-10-CM

## 2011-09-07 LAB — POCT INR: INR: 4.1

## 2011-09-07 MED ORDER — LISINOPRIL 20 MG PO TABS: 20.0000 mg | ORAL_TABLET | Freq: Every day | ORAL | Status: DC

## 2011-09-07 MED ORDER — FENOFIBRATE 160 MG PO TABS: 160.0000 mg | ORAL_TABLET | Freq: Every day | ORAL | Status: DC

## 2011-09-07 MED ORDER — WARFARIN SODIUM 5 MG PO TABS: ORAL_TABLET | ORAL | Status: DC

## 2011-09-07 MED ORDER — FUROSEMIDE 20 MG PO TABS: 20.0000 mg | ORAL_TABLET | Freq: Every day | ORAL | Status: DC

## 2011-09-07 MED ORDER — ATORVASTATIN CALCIUM 20 MG PO TABS: 20.0000 mg | ORAL_TABLET | Freq: Every day | ORAL | Status: DC

## 2011-09-07 MED ORDER — AMLODIPINE BESYLATE 5 MG PO TABS: 5.0000 mg | ORAL_TABLET | Freq: Every evening | ORAL | Status: DC

## 2011-09-07 MED ORDER — METOPROLOL TARTRATE 100 MG PO TABS: 100.0000 mg | ORAL_TABLET | Freq: Two times a day (BID) | ORAL | Status: DC

## 2011-09-07 NOTE — Telephone Encounter (Signed)
Wife phoned in and gave the Fax Number.  The mail order company is [employer].  Rx's faxed [phone]

## 2011-09-07 NOTE — Telephone Encounter (Signed)
LMOVM to return call for pickup or see if they can provide a fax number.

## 2011-09-10 ENCOUNTER — Encounter: Payer: Self-pay | Admitting: Family Medicine

## 2011-09-14 ENCOUNTER — Other Ambulatory Visit: Payer: Self-pay | Admitting: *Deleted

## 2011-09-14 MED ORDER — FENOFIBRATE 160 MG PO TABS: 160.0000 mg | ORAL_TABLET | Freq: Every day | ORAL | Status: DC

## 2011-09-14 MED ORDER — FUROSEMIDE 20 MG PO TABS: 20.0000 mg | ORAL_TABLET | Freq: Every day | ORAL | Status: DC

## 2011-09-14 MED ORDER — LISINOPRIL 20 MG PO TABS: 20.0000 mg | ORAL_TABLET | Freq: Every day | ORAL | Status: DC

## 2011-09-28 ENCOUNTER — Ambulatory Visit (INDEPENDENT_AMBULATORY_CARE_PROVIDER_SITE_OTHER): Payer: Medicare Other

## 2011-09-28 DIAGNOSIS — I4891 Unspecified atrial fibrillation: Secondary | ICD-10-CM

## 2011-09-28 DIAGNOSIS — Z7901 Long term (current) use of anticoagulants: Secondary | ICD-10-CM

## 2011-09-28 DIAGNOSIS — I4892 Unspecified atrial flutter: Secondary | ICD-10-CM

## 2011-10-15 ENCOUNTER — Ambulatory Visit (INDEPENDENT_AMBULATORY_CARE_PROVIDER_SITE_OTHER): Payer: Medicare Other | Admitting: Family Medicine

## 2011-10-15 ENCOUNTER — Encounter: Payer: Self-pay | Admitting: Family Medicine

## 2011-10-15 ENCOUNTER — Ambulatory Visit (INDEPENDENT_AMBULATORY_CARE_PROVIDER_SITE_OTHER)
Admission: RE | Admit: 2011-10-15 | Discharge: 2011-10-15 | Disposition: A | Discharge status: Home / Self Care | Payer: Medicare Other | Source: Ambulatory Visit | Attending: Family Medicine | Admitting: Family Medicine

## 2011-10-15 VITALS — BP 122/64 | HR 72 | Temp 97.5°F | Wt 180.0 lb

## 2011-10-15 DIAGNOSIS — L97509 Non-pressure chronic ulcer of other part of unspecified foot with unspecified severity: Secondary | ICD-10-CM

## 2011-10-15 DIAGNOSIS — E119 Type 2 diabetes mellitus without complications: Secondary | ICD-10-CM

## 2011-10-15 MED ORDER — DOXYCYCLINE HYCLATE 100 MG PO TABS: 100.0000 mg | ORAL_TABLET | Freq: Two times a day (BID) | ORAL | Status: AC

## 2011-10-15 MED ORDER — HYDROCODONE-ACETAMINOPHEN 5-500 MG PO TABS: 1.0000 | ORAL_TABLET | Freq: Four times a day (QID) | ORAL | Status: AC | PRN

## 2011-10-15 NOTE — Progress Notes (Signed)
R foot and ankle pain.  Had been having more pain over last few days.  Very sensitive to pressure, even the sheet laying on his foot at night.  No h/o trauma.  No known h/o gout.  R foot is red and slightly puffy, diffusely.  H/o DM2. Sugar usually 100-120 in AM.   nad ncat R foot with skin breakdown on plantar side of 1st digit on R foot, doesn't appear to probe to bone.   calluses noted  1+ DP pulses  no sensation to light touch and monofilament on feet  Nails onychomycotic   xrays reviewed.

## 2011-10-15 NOTE — Patient Instructions (Addendum)
See Shirlee Limerick about your referral before you leave today. Start the antibiotics today.  Take the pain meds as needed.  It can make your drowsy.  Take 2.5mg  of coumadin on Monday, Wednesday and Friday.  Recheck INR in 1 week here.

## 2011-10-19 NOTE — Assessment & Plan Note (Addendum)
Now with ulcer and possible fx.  Refer to ortho. D/w pt.  He's in post op shoe in meantime.  Appreciate ortho help. Will cut back coumadin in meantime as he'll be on doxy.  >25 min spent with face to face with patient, >50% counseling and/or coordinating care.

## 2011-10-21 ENCOUNTER — Telehealth: Payer: Self-pay | Admitting: *Deleted

## 2011-10-21 NOTE — Telephone Encounter (Signed)
Received form from [place] requesting, order for diabetic shoes, diagnosis and copy of office notes. Order written by Dr. Para March and office notes faxed back as requested. Sent to be scanned into the chart.

## 2011-10-22 ENCOUNTER — Ambulatory Visit (INDEPENDENT_AMBULATORY_CARE_PROVIDER_SITE_OTHER): Payer: Medicare Other | Admitting: Family Medicine

## 2011-10-22 DIAGNOSIS — I4891 Unspecified atrial fibrillation: Secondary | ICD-10-CM

## 2011-10-22 DIAGNOSIS — Z7902 Long term (current) use of antithrombotics/antiplatelets: Secondary | ICD-10-CM

## 2011-10-22 DIAGNOSIS — I4892 Unspecified atrial flutter: Secondary | ICD-10-CM

## 2011-10-22 DIAGNOSIS — Z5181 Encounter for therapeutic drug level monitoring: Secondary | ICD-10-CM

## 2011-10-22 DIAGNOSIS — Z7901 Long term (current) use of anticoagulants: Secondary | ICD-10-CM

## 2011-10-22 LAB — POCT INR: INR: 2

## 2011-10-22 NOTE — Progress Notes (Signed)
Continue current 2.5mg  qod until recheck Monday at coumadin clinic, due to pt being on abx.

## 2011-10-26 ENCOUNTER — Ambulatory Visit (INDEPENDENT_AMBULATORY_CARE_PROVIDER_SITE_OTHER): Payer: Medicare Other | Admitting: Pharmacist

## 2011-10-26 DIAGNOSIS — I4891 Unspecified atrial fibrillation: Secondary | ICD-10-CM

## 2011-10-26 DIAGNOSIS — Z7901 Long term (current) use of anticoagulants: Secondary | ICD-10-CM

## 2011-10-26 DIAGNOSIS — I4892 Unspecified atrial flutter: Secondary | ICD-10-CM

## 2011-10-26 LAB — POCT INR: INR: 1.6

## 2011-10-28 ENCOUNTER — Telehealth: Payer: Self-pay | Admitting: *Deleted

## 2011-10-28 NOTE — Telephone Encounter (Signed)
Done, please send in.  

## 2011-10-28 NOTE — Telephone Encounter (Signed)
Form faxed to [phone].

## 2011-10-28 NOTE — Telephone Encounter (Signed)
Received form for diabetes supplies, spoke with patient and he does use this company.

## 2011-10-30 ENCOUNTER — Other Ambulatory Visit: Payer: Self-pay

## 2011-10-30 NOTE — Telephone Encounter (Signed)
pts wife called to verify that form for  Diabetic supplies had been sent in. 10/28/11 note said form had been faxed in.

## 2011-11-03 ENCOUNTER — Encounter: Payer: Self-pay | Admitting: Cardiology

## 2011-11-03 ENCOUNTER — Ambulatory Visit (INDEPENDENT_AMBULATORY_CARE_PROVIDER_SITE_OTHER): Payer: Medicare Other | Admitting: Cardiology

## 2011-11-03 VITALS — BP 146/80 | HR 66 | Resp 18 | Ht 66.0 in | Wt 179.0 lb

## 2011-11-03 DIAGNOSIS — I4891 Unspecified atrial fibrillation: Secondary | ICD-10-CM

## 2011-11-03 DIAGNOSIS — I4892 Unspecified atrial flutter: Secondary | ICD-10-CM

## 2011-11-03 DIAGNOSIS — I10 Essential (primary) hypertension: Secondary | ICD-10-CM

## 2011-11-03 DIAGNOSIS — I2510 Atherosclerotic heart disease of native coronary artery without angina pectoris: Secondary | ICD-10-CM

## 2011-11-03 MED ORDER — NITROGLYCERIN 0.4 MG SL SUBL: 0.4000 mg | SUBLINGUAL_TABLET | SUBLINGUAL | Status: DC | PRN

## 2011-11-03 NOTE — Progress Notes (Signed)
HPI [name] comes in today  For the evaluation and management of his coronary disease and history of atrial fib  and flutter.  He's had no chest discomfort or angina. He denies any palpitations.his blood pressure running around 140/90 or less. He is compliant with his medications. Blood work followup Dr. Para March primary care.  Past Medical History  Diagnosis Date  . CAD (coronary artery disease)     cath 2003, occluded S-RCA, occluded S-Dx, L-LAD ok, s/p PTCA to LAD  . Atrial fibrillation   . Atrial flutter     s/p RFCA  . Encounter for long-term (current) use of anticoagulants   . Pure hyperglyceridemia   . Unspecified essential hypertension   . Diabetes mellitus   . OSA (obstructive sleep apnea) 12/11    very mild, AHI 7/hr    Current Outpatient Prescriptions  Medication Sig Dispense Refill  . acetaminophen (TYLENOL) 500 MG tablet 2 tablets by mouth as needed pain       . amLODipine (NORVASC) 5 MG tablet Take 1 tablet (5 mg total) by mouth every evening.  90 tablet  3  . aspirin 81 MG tablet Take 81 mg by mouth daily.        [name] atorvastatin (LIPITOR) 20 MG tablet Take 1 tablet (20 mg total) by mouth daily.  90 tablet  3  . B Complex Vitamins (B COMPLEX-B12) TABS Take 1 tablet by mouth. 1000 mcg daily       . cholecalciferol (VITAMIN D) 1000 UNITS tablet Take 1,000 Units by mouth 2 (two) times a week.        [name] CINNAMON PO Take by mouth daily.        . fenofibrate 160 MG tablet Take 1 tablet (160 mg total) by mouth daily.  90 tablet  3  . fish oil-omega-3 fatty acids 1000 MG capsule Take 2 g by mouth 3 (three) times daily.       . furosemide (LASIX) 20 MG tablet Take 1 tablet (20 mg total) by mouth daily.  90 tablet  3  . lisinopril (PRINIVIL,ZESTRIL) 20 MG tablet Take 1 tablet (20 mg total) by mouth daily.  90 tablet  3  . metoprolol (LOPRESSOR) 100 MG tablet Take 1 tablet (100 mg total) by mouth 2 (two) times daily.  180 tablet  3  . Multiple Vitamin (DAILY MULTIVITAMIN PO) Take  1 tablet by mouth daily.        [name] warfarin (COUMADIN) 5 MG tablet Take as directed by Anticoagulation clinic  90 tablet  3    Allergies  Allergen Reactions  . Isosorbide Mononitrate     REACTION: unspecified  . Quinidine Gluconate     REACTION: unspecified  . Ramipril     REACTION: cough    Family History  Problem Relation Age of Onset  . Stroke Father   . Aneurysm Father   . Hypertension Father   . Prostate cancer Brother   . Hypertension Mother   . Lung cancer Brother     smoker  . Thrombosis Brother   . Other Brother     RF valve disorder (smoker)  . Lung cancer Brother     smoker  . Other Sister     cerebral hemm  . Breast cancer Sister     History   Social History  . Marital Status: Married    Spouse Name: N/A    Number of Children: 5  . Years of Education: N/A   Occupational History  .  [employer]    Social History Main Topics  . Smoking status: Never Smoker   . Smokeless tobacco: Former Neurosurgeon  . Alcohol Use: No  . Drug Use: No  . Sexually Active: Yes   Other Topics Concern  . 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  Abdominal: nondistended, nontender, good bowel sounds, no HSM, no bruits Extremities: no cyanosis, clubbing or edema, no sign of DVT, no varicosities  Skin: normal color, no rashes Neuro: alert and oriented x 3, non-focal Pysch: normal affect  EKG Chronic A. Fib, rate well controlled  BMET      Component Value Date/Time   NA 140 08/25/2011 0943   K 5.0 08/25/2011 0943   CL 104 08/25/2011 0943   CO2 28 08/25/2011 0943   GLUCOSE 111* 08/25/2011 0943   BUN 39* 08/25/2011 0943   CREATININE 2.0* 08/25/2011 0943   CALCIUM 9.6 08/25/2011 0943   GFRNONAA 41* 08/26/2010 1146   GFRAA  Value: 50        The eGFR has been calculated using the MDRD equation. This calculation has not been validated in all clinical situations. eGFR's persistently <60 mL/min signify possible Chronic Kidney Disease.* 08/26/2010 1146    Lipid Panel     Component Value Date/Time   CHOL 155 05/04/2011 0835   TRIG 160.0* 05/04/2011 0835   HDL 34.00* 05/04/2011 0835   CHOLHDL 5 05/04/2011 0835   VLDL 32.0 05/04/2011 0835   LDLCALC 89 05/04/2011 0835    CBC    Component Value Date/Time   WBC 7.5 02/19/2011 0823   RBC 4.26 02/19/2011 0823   HGB 13.4 02/19/2011 0823   HCT 39.7 02/19/2011 0823   PLT 155.0 02/19/2011 0823   MCV 93.2 02/19/2011 0823   MCHC 33.6 02/19/2011 0823   RDW 16.1* 02/19/2011 0823   LYMPHSABS 1.8 02/19/2011 0823   MONOABS 0.6 02/19/2011 0823   EOSABS 0.2 02/19/2011 0823   BASOSABS 0.0 02/19/2011 6045

## 2011-11-03 NOTE — Patient Instructions (Signed)
Your physician wants you to follow-up in: 6 months with Dr. Daleen Squibb.  You will receive a reminder letter in the mail two months in advance. If you don't receive a letter, please call our office to schedule the follow-up appointment.  Keep Nitroglycerin 0.4mg  SL with you at all times and take as directed.

## 2011-11-03 NOTE — Assessment & Plan Note (Signed)
Continue rate control and anticoagulate him.

## 2011-11-03 NOTE — Assessment & Plan Note (Signed)
Stable. No change in medications. Nitroglycerin given with instructions.

## 2011-11-09 ENCOUNTER — Ambulatory Visit (INDEPENDENT_AMBULATORY_CARE_PROVIDER_SITE_OTHER): Payer: Medicare Other | Admitting: *Deleted

## 2011-11-09 DIAGNOSIS — I4891 Unspecified atrial fibrillation: Secondary | ICD-10-CM

## 2011-11-09 DIAGNOSIS — I4892 Unspecified atrial flutter: Secondary | ICD-10-CM

## 2011-11-09 DIAGNOSIS — Z7901 Long term (current) use of anticoagulants: Secondary | ICD-10-CM

## 2011-11-10 ENCOUNTER — Telehealth: Payer: Self-pay

## 2011-11-10 NOTE — Telephone Encounter (Signed)
pts wife called to see if we might know why [employer] denied sending diabetic test strips. I told pt Dr Para March filled out form and sent to [facility] and had pt checked with [employer] or insurance co to see why denied. Pts wife said she would ck with them and pt has appt to see Dr Para March soon and will talk with him then.

## 2011-11-16 ENCOUNTER — Encounter: Payer: Self-pay | Admitting: Family Medicine

## 2011-11-16 ENCOUNTER — Other Ambulatory Visit (INDEPENDENT_AMBULATORY_CARE_PROVIDER_SITE_OTHER): Payer: Medicare Other

## 2011-11-16 DIAGNOSIS — N400 Enlarged prostate without lower urinary tract symptoms: Secondary | ICD-10-CM | POA: Insufficient documentation

## 2011-11-16 DIAGNOSIS — E119 Type 2 diabetes mellitus without complications: Secondary | ICD-10-CM

## 2011-11-16 DIAGNOSIS — R3129 Other microscopic hematuria: Secondary | ICD-10-CM | POA: Insufficient documentation

## 2011-11-16 LAB — BASIC METABOLIC PANEL
BUN: 28 mg/dL — ABNORMAL HIGH (ref 6–23)
Calcium: 9.6 mg/dL (ref 8.4–10.5)
Creatinine, Ser: 1.8 mg/dL — ABNORMAL HIGH (ref 0.4–1.5)
GFR: 39.38 mL/min — ABNORMAL LOW (ref >60.00)
Glucose, Bld: 100 mg/dL — ABNORMAL HIGH (ref 70–99)
Sodium: 139 mEq/L (ref 135–145)

## 2011-11-16 NOTE — Progress Notes (Signed)
Addended by: Judithe Modest D on: 11/16/2011 09:54 AM   Modules accepted: Orders

## 2011-11-23 ENCOUNTER — Ambulatory Visit (INDEPENDENT_AMBULATORY_CARE_PROVIDER_SITE_OTHER): Payer: Medicare Other | Admitting: Family Medicine

## 2011-11-23 ENCOUNTER — Ambulatory Visit (INDEPENDENT_AMBULATORY_CARE_PROVIDER_SITE_OTHER): Payer: Medicare Other | Admitting: *Deleted

## 2011-11-23 ENCOUNTER — Encounter: Payer: Self-pay | Admitting: Family Medicine

## 2011-11-23 VITALS — BP 132/80 | HR 60 | Temp 97.5°F | Wt 183.0 lb

## 2011-11-23 DIAGNOSIS — N183 Chronic kidney disease, stage 3 unspecified: Secondary | ICD-10-CM

## 2011-11-23 DIAGNOSIS — E1140 Type 2 diabetes mellitus with diabetic neuropathy, unspecified: Secondary | ICD-10-CM

## 2011-11-23 DIAGNOSIS — Z7901 Long term (current) use of anticoagulants: Secondary | ICD-10-CM

## 2011-11-23 DIAGNOSIS — E1142 Type 2 diabetes mellitus with diabetic polyneuropathy: Secondary | ICD-10-CM

## 2011-11-23 DIAGNOSIS — E119 Type 2 diabetes mellitus without complications: Secondary | ICD-10-CM

## 2011-11-23 DIAGNOSIS — I4892 Unspecified atrial flutter: Secondary | ICD-10-CM

## 2011-11-23 DIAGNOSIS — E1149 Type 2 diabetes mellitus with other diabetic neurological complication: Secondary | ICD-10-CM

## 2011-11-23 DIAGNOSIS — I4891 Unspecified atrial fibrillation: Secondary | ICD-10-CM

## 2011-11-23 LAB — POCT INR: INR: 2.4

## 2011-11-23 NOTE — Progress Notes (Signed)
Diabetes:  Using medications without difficulties:no meds.  Hypoglycemic episodes:no Hyperglycemic episodes:no Feet problems: see below Blood Sugars averaging: ~80s-100.  He was seen by ortho for his foot ulcer.  Still with ulceration on plantar side of R 1st toe.  Pain is decreased.  No drainage recently.  He's been wearing a postop shoe.  He didn't have a fracture per ortho per patient.   CKD with Cr stable.  Labs noted and reviewed with patient.    PMH and SH reviewed  ROS: See HPI, otherwise noncontributory.  Meds, vitals, and allergies reviewed.   GEN: nad, alert and oriented HEENT: mucous membranes moist NECK: supple w/o LA CV: IRR. PULM: ctab, no inc wob ABD: soft, +bs EXT: no edema SKIN: no acute rash  Diabetic foot exam: Dec DP pulses Dec sensation to light touch and monofilament 15x53mm clean based ulcer on plantar side of R 1st toe.

## 2011-11-23 NOTE — Patient Instructions (Signed)
Recheck A1c in 3 months.  Come see me after that.   Keep the appointment with ortho about your foot ulcer.  If you can get a form for the diabetic strips, then send it to me.

## 2011-11-24 NOTE — Assessment & Plan Note (Signed)
A1c at goal. No change in meds.  D/w pt about labs, sugar, foot care, and complications.  He'll f/u with ortho and then back with me later in 2013. >25 min spent with face to face with patient, >50% counseling and/or coordinating care.

## 2011-11-24 NOTE — Assessment & Plan Note (Signed)
At baseline on current labs.  D/w pt.  No change in meds.

## 2011-12-07 ENCOUNTER — Telehealth: Payer: Self-pay | Admitting: *Deleted

## 2011-12-07 NOTE — Telephone Encounter (Signed)
Form is in your in box for diabetic supplies.  Patient has signified that he does wish to use this company.

## 2011-12-07 NOTE — Telephone Encounter (Signed)
Done, please send

## 2011-12-08 NOTE — Telephone Encounter (Signed)
Faxed form.

## 2011-12-21 ENCOUNTER — Ambulatory Visit (INDEPENDENT_AMBULATORY_CARE_PROVIDER_SITE_OTHER): Payer: Medicare Other | Admitting: Pharmacist

## 2011-12-21 DIAGNOSIS — I4892 Unspecified atrial flutter: Secondary | ICD-10-CM

## 2011-12-21 DIAGNOSIS — I4891 Unspecified atrial fibrillation: Secondary | ICD-10-CM

## 2011-12-21 DIAGNOSIS — Z7901 Long term (current) use of anticoagulants: Secondary | ICD-10-CM

## 2011-12-21 LAB — POCT INR: INR: 3.8

## 2012-01-11 ENCOUNTER — Ambulatory Visit (INDEPENDENT_AMBULATORY_CARE_PROVIDER_SITE_OTHER): Payer: Medicare Other

## 2012-01-11 DIAGNOSIS — Z7901 Long term (current) use of anticoagulants: Secondary | ICD-10-CM

## 2012-01-11 DIAGNOSIS — I4892 Unspecified atrial flutter: Secondary | ICD-10-CM

## 2012-01-11 DIAGNOSIS — I4891 Unspecified atrial fibrillation: Secondary | ICD-10-CM

## 2012-01-25 ENCOUNTER — Ambulatory Visit (INDEPENDENT_AMBULATORY_CARE_PROVIDER_SITE_OTHER): Payer: Medicare Other

## 2012-01-25 DIAGNOSIS — I4891 Unspecified atrial fibrillation: Secondary | ICD-10-CM

## 2012-01-25 DIAGNOSIS — Z7901 Long term (current) use of anticoagulants: Secondary | ICD-10-CM

## 2012-01-25 DIAGNOSIS — I4892 Unspecified atrial flutter: Secondary | ICD-10-CM

## 2012-02-15 ENCOUNTER — Ambulatory Visit (INDEPENDENT_AMBULATORY_CARE_PROVIDER_SITE_OTHER): Payer: Medicare Other | Admitting: *Deleted

## 2012-02-15 DIAGNOSIS — I4892 Unspecified atrial flutter: Secondary | ICD-10-CM

## 2012-02-15 DIAGNOSIS — Z7901 Long term (current) use of anticoagulants: Secondary | ICD-10-CM

## 2012-02-15 DIAGNOSIS — I4891 Unspecified atrial fibrillation: Secondary | ICD-10-CM

## 2012-02-18 ENCOUNTER — Other Ambulatory Visit: Payer: Medicare Other

## 2012-02-25 ENCOUNTER — Encounter: Payer: Self-pay | Admitting: Family Medicine

## 2012-02-25 ENCOUNTER — Ambulatory Visit (INDEPENDENT_AMBULATORY_CARE_PROVIDER_SITE_OTHER): Payer: Medicare Other | Admitting: Family Medicine

## 2012-02-25 VITALS — BP 130/72 | HR 62 | Temp 98.2°F | Wt 179.8 lb

## 2012-02-25 DIAGNOSIS — E1142 Type 2 diabetes mellitus with diabetic polyneuropathy: Secondary | ICD-10-CM

## 2012-02-25 DIAGNOSIS — E1140 Type 2 diabetes mellitus with diabetic neuropathy, unspecified: Secondary | ICD-10-CM

## 2012-02-25 DIAGNOSIS — E119 Type 2 diabetes mellitus without complications: Secondary | ICD-10-CM

## 2012-02-25 DIAGNOSIS — E1149 Type 2 diabetes mellitus with other diabetic neurological complication: Secondary | ICD-10-CM

## 2012-02-25 DIAGNOSIS — I10 Essential (primary) hypertension: Secondary | ICD-10-CM

## 2012-02-25 MED ORDER — AMLODIPINE BESYLATE 5 MG PO TABS: 2.5000 mg | ORAL_TABLET | Freq: Every evening | ORAL | Status: DC

## 2012-02-25 NOTE — Assessment & Plan Note (Signed)
No meds for DM at this point.  Check A1c today and recheck in 3 months.  Continue with current routine and he'll monitor foot.  D/w pt.

## 2012-02-25 NOTE — Progress Notes (Signed)
Diabetes:  No meds Hypoglycemic episodes:no Hyperglycemic episodes:no Feet problems: Prev with foot ulcer Blood Sugars averaging: 80-90s Due for A1c He's working on diet and weight.    HTN. Compliant with meds. He'll occ get lightheaded with he leans over for a period of time and then stands up quickly.  No other ADE.  No CP.   PMH and SH reviewed  Meds, vitals, and allergies reviewed.   ROS: See HPI.  Otherwise negative.    GEN: nad, alert and oriented HEENT: mucous membranes moist NECK: supple w/o LA CV: IRR not tachy PULM: ctab, no inc wob ABD: soft, +bs EXT: no edema SKIN: no acute rash  Diabetic foot exam: 13x6 mm callus at the prev ulcer site.  Not ttp. Dec sensation in the feet B Symmetric DP pulses, 1+ B

## 2012-02-25 NOTE — Assessment & Plan Note (Signed)
With likely postural hypotension.  Will dec amlodipine to 2.5mg  a day.  See instructions.

## 2012-02-25 NOTE — Patient Instructions (Addendum)
Come back for labs in 3 months and then see me a few days after that.   Cut the amlodipine back to 2.5 mg a day (1/2 tab of the 5mg ).  See if that helps the lightheaded feeling.  Keep checking your feet. Go to the lab on the way out. We'll contact you with your lab report.

## 2012-02-26 ENCOUNTER — Encounter: Payer: Self-pay | Admitting: Family Medicine

## 2012-02-29 ENCOUNTER — Encounter: Payer: Self-pay | Admitting: *Deleted

## 2012-03-14 ENCOUNTER — Ambulatory Visit (INDEPENDENT_AMBULATORY_CARE_PROVIDER_SITE_OTHER): Payer: Medicare Other

## 2012-03-14 DIAGNOSIS — Z7901 Long term (current) use of anticoagulants: Secondary | ICD-10-CM

## 2012-03-14 DIAGNOSIS — I4892 Unspecified atrial flutter: Secondary | ICD-10-CM

## 2012-03-14 DIAGNOSIS — I4891 Unspecified atrial fibrillation: Secondary | ICD-10-CM

## 2012-04-04 ENCOUNTER — Ambulatory Visit (INDEPENDENT_AMBULATORY_CARE_PROVIDER_SITE_OTHER): Payer: Medicare Other | Admitting: *Deleted

## 2012-04-04 ENCOUNTER — Telehealth: Payer: Self-pay | Admitting: Cardiology

## 2012-04-04 DIAGNOSIS — I4892 Unspecified atrial flutter: Secondary | ICD-10-CM

## 2012-04-04 DIAGNOSIS — I4891 Unspecified atrial fibrillation: Secondary | ICD-10-CM

## 2012-04-04 DIAGNOSIS — Z7901 Long term (current) use of anticoagulants: Secondary | ICD-10-CM

## 2012-04-04 NOTE — Telephone Encounter (Signed)
Walk In pt Form " Renewal Of Disability Card" Dropped Off By Pt Sent to Debby [facility]  04/04/12/KM

## 2012-04-28 ENCOUNTER — Ambulatory Visit: Payer: Self-pay | Admitting: Vascular Surgery

## 2012-04-28 LAB — BASIC METABOLIC PANEL
Anion Gap: 8 (ref 7–16)
BUN: 32 mg/dL — ABNORMAL HIGH (ref 7–18)
Calcium, Total: 8.9 mg/dL (ref 8.5–10.1)
Co2: 25 mmol/L (ref 21–32)
Creatinine: 1.65 mg/dL — ABNORMAL HIGH (ref 0.60–1.30)
EGFR (African American): 44 — ABNORMAL LOW
Sodium: 139 mmol/L (ref 136–145)

## 2012-04-28 LAB — PROTIME-INR: INR: 1.3

## 2012-05-01 ENCOUNTER — Encounter: Payer: Self-pay | Admitting: Family Medicine

## 2012-05-01 DIAGNOSIS — I739 Peripheral vascular disease, unspecified: Secondary | ICD-10-CM | POA: Insufficient documentation

## 2012-05-09 ENCOUNTER — Ambulatory Visit (INDEPENDENT_AMBULATORY_CARE_PROVIDER_SITE_OTHER): Payer: Medicare Other | Admitting: *Deleted

## 2012-05-09 DIAGNOSIS — I4892 Unspecified atrial flutter: Secondary | ICD-10-CM

## 2012-05-09 DIAGNOSIS — Z7901 Long term (current) use of anticoagulants: Secondary | ICD-10-CM

## 2012-05-09 DIAGNOSIS — I4891 Unspecified atrial fibrillation: Secondary | ICD-10-CM

## 2012-05-17 ENCOUNTER — Ambulatory Visit (INDEPENDENT_AMBULATORY_CARE_PROVIDER_SITE_OTHER): Payer: Medicare Other | Admitting: Cardiology

## 2012-05-17 ENCOUNTER — Ambulatory Visit (INDEPENDENT_AMBULATORY_CARE_PROVIDER_SITE_OTHER): Payer: Medicare Other | Admitting: *Deleted

## 2012-05-17 ENCOUNTER — Encounter: Payer: Self-pay | Admitting: Cardiology

## 2012-05-17 VITALS — BP 126/68 | HR 60 | Ht 65.0 in | Wt 185.0 lb

## 2012-05-17 DIAGNOSIS — E119 Type 2 diabetes mellitus without complications: Secondary | ICD-10-CM | POA: Insufficient documentation

## 2012-05-17 DIAGNOSIS — I4892 Unspecified atrial flutter: Secondary | ICD-10-CM

## 2012-05-17 DIAGNOSIS — E1159 Type 2 diabetes mellitus with other circulatory complications: Secondary | ICD-10-CM

## 2012-05-17 DIAGNOSIS — E1151 Type 2 diabetes mellitus with diabetic peripheral angiopathy without gangrene: Secondary | ICD-10-CM

## 2012-05-17 DIAGNOSIS — E1140 Type 2 diabetes mellitus with diabetic neuropathy, unspecified: Secondary | ICD-10-CM

## 2012-05-17 DIAGNOSIS — I4891 Unspecified atrial fibrillation: Secondary | ICD-10-CM

## 2012-05-17 DIAGNOSIS — I739 Peripheral vascular disease, unspecified: Secondary | ICD-10-CM

## 2012-05-17 DIAGNOSIS — I2510 Atherosclerotic heart disease of native coronary artery without angina pectoris: Secondary | ICD-10-CM

## 2012-05-17 DIAGNOSIS — Z7901 Long term (current) use of anticoagulants: Secondary | ICD-10-CM

## 2012-05-17 DIAGNOSIS — I10 Essential (primary) hypertension: Secondary | ICD-10-CM

## 2012-05-17 DIAGNOSIS — E1149 Type 2 diabetes mellitus with other diabetic neurological complication: Secondary | ICD-10-CM

## 2012-05-17 DIAGNOSIS — E1142 Type 2 diabetes mellitus with diabetic polyneuropathy: Secondary | ICD-10-CM

## 2012-05-17 NOTE — Progress Notes (Signed)
HPI Eric [name] returns today for evaluation and management of his coronary artery disease, history bypass surgery, history of chronic A. fib, history of a flutter status post ablation, long-term use of anticoagulation was, hypertension, and peripheral vascular disease.  He pulled a bunion off the bottom of his foot about 6 months ago. It did not heal. He ended up having what sounds like an angioplasty of his right lower extremity in [place] by Dr.[name].  It is now healed. He still cuts his own toenails.  He also has diabetes with peripheral neuropathy.  He denies any angina or chest discomfort. He will spontaneously have sharp chest pains that go away    He denies any palpitations, presyncope or syncope. He's had no bleeding from his anticoagulation.  Past Medical History  Diagnosis Date  . CAD (coronary artery disease)     cath 2003, occluded S-RCA, occluded S-Dx, L-LAD ok, s/p PTCA to LAD  . Atrial fibrillation   . Atrial flutter     s/p RFCA  . Long term (current) use of anticoagulants   . Pure hyperglyceridemia   . Unspecified essential hypertension   . Diabetes mellitus   . OSA (obstructive sleep apnea) 12/11    very mild, AHI 7/hr    Current Outpatient Prescriptions  Medication Sig Dispense Refill  . acetaminophen (TYLENOL) 500 MG tablet 2 tablets by mouth as needed pain       . amLODipine (NORVASC) 5 MG tablet Take 0.5 tablets (2.5 mg total) by mouth every evening.      [name] aspirin 81 MG tablet Take 81 mg by mouth daily.        [name] atorvastatin (LIPITOR) 20 MG tablet Take 1 tablet (20 mg total) by mouth daily.  90 tablet  3  . B Complex Vitamins (B COMPLEX-B12) TABS Take 1 tablet by mouth. 1000 mcg daily       . cholecalciferol (VITAMIN D) 1000 UNITS tablet Take 1,000 Units by mouth 2 (two) times a week.        [name] CINNAMON PO Take by mouth daily.        . fenofibrate 160 MG tablet Take 1 tablet (160 mg total) by mouth daily.  90 tablet  3  . fish oil-omega-3 fatty acids 1000  MG capsule Take 2 g by mouth 3 (three) times daily.       . furosemide (LASIX) 20 MG tablet Take 1 tablet (20 mg total) by mouth daily.  90 tablet  3  . lisinopril (PRINIVIL,ZESTRIL) 20 MG tablet Take 1 tablet (20 mg total) by mouth daily.  90 tablet  3  . metoprolol (LOPRESSOR) 100 MG tablet Take 1 tablet (100 mg total) by mouth 2 (two) times daily.  180 tablet  3  . Multiple Vitamin (DAILY MULTIVITAMIN PO) Take 1 tablet by mouth daily.        . nitroGLYCERIN (NITROSTAT) 0.4 MG SL tablet Place 1 tablet (0.4 mg total) under the tongue every 5 (five) minutes as needed for chest pain.  25 tablet  3  . Tamsulosin HCl (FLOMAX) 0.4 MG CAPS Take 0.4 mg by mouth daily after breakfast.      . warfarin (COUMADIN) 5 MG tablet Take as directed by Anticoagulation clinic  90 tablet  3    Allergies  Allergen Reactions  . Isosorbide Mononitrate     REACTION: unspecified  . Quinidine Gluconate     REACTION: unspecified  . Ramipril     REACTION: cough    Family  History  Problem Relation Age of Onset  . Stroke Father   . Aneurysm Father   . Hypertension Father   . Prostate cancer Brother   . Hypertension Mother   . Lung cancer Brother     smoker  . Thrombosis Brother   . Other Brother     RF valve disorder (smoker)  . Lung cancer Brother     smoker  . Other Sister     cerebral hemm  . Breast cancer Sister     History   Social History  . Marital Status: Married    Spouse Name: N/A    Number of Children: 5  . Years of Education: N/A   Occupational History  . [employer]    Social History Main Topics  . Smoking status: Never Smoker   . Smokeless tobacco: Former Neurosurgeon  . Alcohol Use: No  . Drug Use: No  . Sexually Active: Yes   Other Topics Concern  . 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 Abdominal: nondistended, nontender, good bowel sounds, no HSM, no bruits Extremities: no cyanosis, clubbing or edema, no sign of DVT, no varicosities , no lesions, toenails cut way too short. , Neuro: alert and oriented x 3, non-focal Pysch: normal affect  EKG Chronic A. fib, ST segment changes, no acute changes.  BMET    Component Value Date/Time   NA 139 11/16/2011 0846   K 5.1 11/16/2011 0846   CL 104 11/16/2011 0846   CO2 25 11/16/2011 0846   GLUCOSE 100* 11/16/2011 0846   BUN 28* 11/16/2011 0846   CREATININE 1.8* 11/16/2011 0846   CALCIUM 9.6 11/16/2011 0846   GFRNONAA 41* 08/26/2010 1146   GFRAA  Value: 50        The eGFR has been calculated using the MDRD equation. This calculation has not been validated in all clinical situations. eGFR's persistently <60 mL/min signify possible Chronic Kidney Disease.* 08/26/2010 1146    Lipid Panel     Component Value Date/Time   CHOL 155 05/04/2011 0835   TRIG 160.0* 05/04/2011 0835   HDL 34.00* 05/04/2011 0835   CHOLHDL 5 05/04/2011 0835   VLDL 32.0 05/04/2011 0835   LDLCALC 89 05/04/2011 0835    CBC    Component Value Date/Time   WBC 7.5 02/19/2011 0823   RBC 4.26 02/19/2011 0823   HGB 13.4 02/19/2011 0823   HCT 39.7 02/19/2011 0823   PLT 155.0 02/19/2011 0823   MCV 93.2 02/19/2011 0823   MCHC 33.6 02/19/2011 0823   RDW 16.1* 02/19/2011 0823   LYMPHSABS 1.8 02/19/2011 0823   MONOABS 0.6 02/19/2011 0823   EOSABS 0.2 02/19/2011 0823   BASOSABS 0.0 02/19/2011 9562

## 2012-05-17 NOTE — Patient Instructions (Signed)
**Note De-identified Eric [name] Obfuscation** Your physician recommends that you continue on your current medications as directed. Please refer to the Current Medication list given to you today.  Your physician wants you to follow-up in: 6 months. You will receive a reminder letter in the mail two months in advance. If you don't receive a letter, please call our office to schedule the follow-up appointment.  

## 2012-05-17 NOTE — Assessment & Plan Note (Signed)
Stable. Continue secondary preventative therapy. 

## 2012-05-17 NOTE — Assessment & Plan Note (Signed)
Continue rate control and anticoagulation.  

## 2012-05-17 NOTE — Assessment & Plan Note (Signed)
Patient and wife told to watch his feet almost on a daily basis. I've advised him not to cut his toenails anymore. I've also advised him to not go barefooted with his neuropathy. No changes in secondary preventative therapy. Return to the office in 6 months.

## 2012-05-27 ENCOUNTER — Other Ambulatory Visit (INDEPENDENT_AMBULATORY_CARE_PROVIDER_SITE_OTHER): Payer: Medicare Other

## 2012-05-27 DIAGNOSIS — Z79899 Other long term (current) drug therapy: Secondary | ICD-10-CM

## 2012-05-27 DIAGNOSIS — E1149 Type 2 diabetes mellitus with other diabetic neurological complication: Secondary | ICD-10-CM

## 2012-05-27 DIAGNOSIS — E1142 Type 2 diabetes mellitus with diabetic polyneuropathy: Secondary | ICD-10-CM

## 2012-05-27 DIAGNOSIS — E1140 Type 2 diabetes mellitus with diabetic neuropathy, unspecified: Secondary | ICD-10-CM

## 2012-05-27 LAB — HEMOGLOBIN A1C: Hgb A1c MFr Bld: 6.3 % (ref 4.6–6.5)

## 2012-06-02 ENCOUNTER — Ambulatory Visit (INDEPENDENT_AMBULATORY_CARE_PROVIDER_SITE_OTHER): Payer: Medicare Other | Admitting: *Deleted

## 2012-06-02 ENCOUNTER — Ambulatory Visit (INDEPENDENT_AMBULATORY_CARE_PROVIDER_SITE_OTHER): Payer: Medicare Other | Admitting: Family Medicine

## 2012-06-02 ENCOUNTER — Encounter: Payer: Self-pay | Admitting: Family Medicine

## 2012-06-02 VITALS — BP 122/62 | HR 80 | Temp 97.5°F | Wt 183.0 lb

## 2012-06-02 DIAGNOSIS — E1159 Type 2 diabetes mellitus with other circulatory complications: Secondary | ICD-10-CM

## 2012-06-02 DIAGNOSIS — Z23 Encounter for immunization: Secondary | ICD-10-CM

## 2012-06-02 DIAGNOSIS — Z7901 Long term (current) use of anticoagulants: Secondary | ICD-10-CM

## 2012-06-02 DIAGNOSIS — E1151 Type 2 diabetes mellitus with diabetic peripheral angiopathy without gangrene: Secondary | ICD-10-CM

## 2012-06-02 DIAGNOSIS — I4892 Unspecified atrial flutter: Secondary | ICD-10-CM

## 2012-06-02 DIAGNOSIS — I739 Peripheral vascular disease, unspecified: Secondary | ICD-10-CM | POA: Insufficient documentation

## 2012-06-02 DIAGNOSIS — I4891 Unspecified atrial fibrillation: Secondary | ICD-10-CM

## 2012-06-02 LAB — POCT INR: INR: 3

## 2012-06-02 NOTE — Patient Instructions (Addendum)
Schedule a physical in about 4-5 months with labs ahead of time. Take care.  Glad to see you.

## 2012-06-02 NOTE — Progress Notes (Signed)
PVD.  He pulled a bunion off the bottom of his foot about 6 months ago. It did not heal. He ended up having what sounds like an angioplasty of his right lower extremity in [place] by Dr.[name].  He is much improved.  He's walking much better.    Diabetes:  No meds.  Hypoglycemic episodes:no Hyperglycemic episodes:no Feet problems: as above. Blood Sugars averaging: ~100.   He has some recent URI/allergy sx with some transient lightheadedness.  No syncope.  No CP.  It has all resolved.    PMH and SH reviewed  Meds, vitals, and allergies reviewed.   ROS: See HPI.  Otherwise negative.    GEN: nad, alert and oriented HEENT: mucous membranes moist, TM and nasal/OP exam wnl NECK: supple w/o LA CV: IRR, not tachy PULM: ctab, no inc wob ABD: soft, +bs EXT: no edema SKIN: no acute rash  Diabetic foot exam: Normal inspection No skin breakdown Healing callus on R foot noted 1+ DP pulses Dec sensation to light touch and monofilament Nails thickened

## 2012-06-02 NOTE — Assessment & Plan Note (Signed)
A1c acceptable, continue to work on diet.  He'll monitor his feet and notify us of changes.  He does appear to be healing.

## 2012-06-02 NOTE — Assessment & Plan Note (Signed)
Improved healing and exercise tolerance after R leg angioplasty.  Continue risk factor modification.

## 2012-06-23 ENCOUNTER — Ambulatory Visit (INDEPENDENT_AMBULATORY_CARE_PROVIDER_SITE_OTHER): Payer: Medicare Other | Admitting: *Deleted

## 2012-06-23 DIAGNOSIS — I4892 Unspecified atrial flutter: Secondary | ICD-10-CM

## 2012-06-23 DIAGNOSIS — I4891 Unspecified atrial fibrillation: Secondary | ICD-10-CM

## 2012-06-23 DIAGNOSIS — Z7901 Long term (current) use of anticoagulants: Secondary | ICD-10-CM

## 2012-06-23 LAB — POCT INR: INR: 4.8

## 2012-07-04 ENCOUNTER — Ambulatory Visit (INDEPENDENT_AMBULATORY_CARE_PROVIDER_SITE_OTHER): Payer: Medicare Other | Admitting: Pharmacist

## 2012-07-04 DIAGNOSIS — I4891 Unspecified atrial fibrillation: Secondary | ICD-10-CM

## 2012-07-04 DIAGNOSIS — Z7901 Long term (current) use of anticoagulants: Secondary | ICD-10-CM

## 2012-07-04 DIAGNOSIS — I4892 Unspecified atrial flutter: Secondary | ICD-10-CM

## 2012-07-04 LAB — POCT INR: INR: 1.7

## 2012-07-18 ENCOUNTER — Ambulatory Visit (INDEPENDENT_AMBULATORY_CARE_PROVIDER_SITE_OTHER): Payer: Medicare Other | Admitting: *Deleted

## 2012-07-18 DIAGNOSIS — Z7901 Long term (current) use of anticoagulants: Secondary | ICD-10-CM

## 2012-07-18 DIAGNOSIS — I4891 Unspecified atrial fibrillation: Secondary | ICD-10-CM

## 2012-07-18 DIAGNOSIS — I4892 Unspecified atrial flutter: Secondary | ICD-10-CM

## 2012-07-18 LAB — POCT INR: INR: 2.3

## 2012-08-15 ENCOUNTER — Ambulatory Visit (INDEPENDENT_AMBULATORY_CARE_PROVIDER_SITE_OTHER): Payer: Medicare Other | Admitting: *Deleted

## 2012-08-15 DIAGNOSIS — Z7901 Long term (current) use of anticoagulants: Secondary | ICD-10-CM

## 2012-08-15 DIAGNOSIS — I4891 Unspecified atrial fibrillation: Secondary | ICD-10-CM

## 2012-08-15 DIAGNOSIS — I4892 Unspecified atrial flutter: Secondary | ICD-10-CM

## 2012-08-15 LAB — POCT INR: INR: 1.7

## 2012-08-29 ENCOUNTER — Ambulatory Visit (INDEPENDENT_AMBULATORY_CARE_PROVIDER_SITE_OTHER): Payer: Medicare Other | Admitting: *Deleted

## 2012-08-29 DIAGNOSIS — I4892 Unspecified atrial flutter: Secondary | ICD-10-CM

## 2012-08-29 DIAGNOSIS — I4891 Unspecified atrial fibrillation: Secondary | ICD-10-CM

## 2012-08-29 DIAGNOSIS — Z7901 Long term (current) use of anticoagulants: Secondary | ICD-10-CM

## 2012-08-29 LAB — POCT INR: INR: 1.7

## 2012-08-30 ENCOUNTER — Telehealth: Payer: Self-pay | Admitting: Family Medicine

## 2012-08-30 NOTE — Telephone Encounter (Signed)
Pt's wife called and stated that they lost paperwork that Dr. Para March gave them for pt to get diabetic shoes and is requesting another form.

## 2012-09-01 ENCOUNTER — Telehealth: Payer: Self-pay | Admitting: Family Medicine

## 2012-09-01 NOTE — Telephone Encounter (Signed)
LMOVM to return call.

## 2012-09-01 NOTE — Telephone Encounter (Signed)
Pt returned your call and requests you call her back. Thank you.

## 2012-09-01 NOTE — Telephone Encounter (Signed)
rx written for diabetic shoes. H/o decreased sensation and h/o foot calluses.

## 2012-09-01 NOTE — Telephone Encounter (Signed)
Ms. Anguiano says that they did not get the diabetic shoes immediately because they thought that his toe was going to be okay.  However, he saw vein and vascular and they found a blockage behind his knee and corrected that and recommended the shoes.  When they submitted the previous Rx for the shoes, they told him the Rx had expired and that he would need a new one.

## 2012-09-01 NOTE — Telephone Encounter (Signed)
Faxed Rx to patient per wife's request at home number.

## 2012-09-19 ENCOUNTER — Ambulatory Visit (INDEPENDENT_AMBULATORY_CARE_PROVIDER_SITE_OTHER): Payer: Medicare Other | Admitting: *Deleted

## 2012-09-19 DIAGNOSIS — I4891 Unspecified atrial fibrillation: Secondary | ICD-10-CM

## 2012-09-19 DIAGNOSIS — I4892 Unspecified atrial flutter: Secondary | ICD-10-CM

## 2012-09-19 DIAGNOSIS — Z7901 Long term (current) use of anticoagulants: Secondary | ICD-10-CM

## 2012-09-19 LAB — POCT INR: INR: 3.3

## 2012-09-21 ENCOUNTER — Other Ambulatory Visit: Payer: Self-pay | Admitting: Family Medicine

## 2012-09-21 ENCOUNTER — Other Ambulatory Visit: Payer: Self-pay | Admitting: *Deleted

## 2012-09-21 DIAGNOSIS — E538 Deficiency of other specified B group vitamins: Secondary | ICD-10-CM

## 2012-09-21 DIAGNOSIS — E119 Type 2 diabetes mellitus without complications: Secondary | ICD-10-CM

## 2012-09-21 DIAGNOSIS — I2510 Atherosclerotic heart disease of native coronary artery without angina pectoris: Secondary | ICD-10-CM

## 2012-09-21 MED ORDER — METOPROLOL TARTRATE 100 MG PO TABS: 100.0000 mg | ORAL_TABLET | Freq: Two times a day (BID) | ORAL | Status: DC

## 2012-09-21 MED ORDER — FENOFIBRATE 160 MG PO TABS: 160.0000 mg | ORAL_TABLET | Freq: Every day | ORAL | Status: DC

## 2012-09-21 MED ORDER — FUROSEMIDE 20 MG PO TABS: 20.0000 mg | ORAL_TABLET | Freq: Every day | ORAL | Status: DC

## 2012-09-21 MED ORDER — LISINOPRIL 20 MG PO TABS: 20.0000 mg | ORAL_TABLET | Freq: Every day | ORAL | Status: DC

## 2012-09-21 MED ORDER — AMLODIPINE BESYLATE 5 MG PO TABS: 2.5000 mg | ORAL_TABLET | Freq: Every evening | ORAL | Status: DC

## 2012-09-30 ENCOUNTER — Other Ambulatory Visit (INDEPENDENT_AMBULATORY_CARE_PROVIDER_SITE_OTHER): Payer: Medicare Other

## 2012-09-30 DIAGNOSIS — I2510 Atherosclerotic heart disease of native coronary artery without angina pectoris: Secondary | ICD-10-CM

## 2012-09-30 DIAGNOSIS — E538 Deficiency of other specified B group vitamins: Secondary | ICD-10-CM

## 2012-09-30 DIAGNOSIS — E119 Type 2 diabetes mellitus without complications: Secondary | ICD-10-CM

## 2012-09-30 LAB — HEMOGLOBIN A1C: Hgb A1c MFr Bld: 6.8 % — ABNORMAL HIGH (ref 4.6–6.5)

## 2012-09-30 LAB — CBC WITH DIFFERENTIAL/PLATELET
Basophils Absolute: 0.1 10*3/uL (ref 0.0–0.1)
Eosinophils Absolute: 0.2 10*3/uL (ref 0.0–0.7)
Lymphocytes Relative: 19.5 % (ref 12.0–46.0)
MCHC: 32.8 g/dL (ref 30.0–36.0)
MCV: 90 fl (ref 78.0–100.0)
Monocytes Absolute: 0.8 10*3/uL (ref 0.1–1.0)
Neutrophils Relative %: 68.7 % (ref 43.0–77.0)
Platelets: 166 10*3/uL (ref 150.0–400.0)
RBC: 4.38 Mil/uL (ref 4.22–5.81)
RDW: 16.5 % — ABNORMAL HIGH (ref 11.5–14.6)

## 2012-09-30 LAB — COMPREHENSIVE METABOLIC PANEL
ALT: 22 U/L (ref 0–53)
Albumin: 3.7 g/dL (ref 3.5–5.2)
CO2: 27 mEq/L (ref 19–32)
Calcium: 9.6 mg/dL (ref 8.4–10.5)
Chloride: 106 mEq/L (ref 96–112)
GFR: 47.2 mL/min — ABNORMAL LOW (ref >60.00)
Glucose, Bld: 101 mg/dL — ABNORMAL HIGH (ref 70–99)
Sodium: 138 mEq/L (ref 135–145)
Total Bilirubin: 0.8 mg/dL (ref 0.3–1.2)
Total Protein: 7 g/dL (ref 6.0–8.3)

## 2012-09-30 LAB — LIPID PANEL
HDL: 25.9 mg/dL — ABNORMAL LOW (ref >39.00)
Triglycerides: 123 mg/dL (ref 0.0–149.0)

## 2012-10-07 ENCOUNTER — Ambulatory Visit (INDEPENDENT_AMBULATORY_CARE_PROVIDER_SITE_OTHER): Payer: Medicare Other | Admitting: Family Medicine

## 2012-10-07 ENCOUNTER — Encounter: Payer: Self-pay | Admitting: Family Medicine

## 2012-10-07 VITALS — BP 128/76 | HR 76 | Temp 97.2°F | Ht 65.0 in | Wt 185.0 lb

## 2012-10-07 DIAGNOSIS — E119 Type 2 diabetes mellitus without complications: Secondary | ICD-10-CM

## 2012-10-07 DIAGNOSIS — N400 Enlarged prostate without lower urinary tract symptoms: Secondary | ICD-10-CM

## 2012-10-07 DIAGNOSIS — Z0000 Encounter for general adult medical examination without abnormal findings: Secondary | ICD-10-CM

## 2012-10-07 DIAGNOSIS — E1151 Type 2 diabetes mellitus with diabetic peripheral angiopathy without gangrene: Secondary | ICD-10-CM

## 2012-10-07 DIAGNOSIS — I4891 Unspecified atrial fibrillation: Secondary | ICD-10-CM

## 2012-10-07 DIAGNOSIS — I739 Peripheral vascular disease, unspecified: Secondary | ICD-10-CM

## 2012-10-07 NOTE — Patient Instructions (Addendum)
Check with your insurance to see if they will cover the shingles shot. Don't change your meds for now and recheck in 6 months with labs ahead of time.  You don't have to fast for the labs in 6 months.   Take care.

## 2012-10-07 NOTE — Progress Notes (Signed)
Patient did not get Medicare Form in the mail and doesn't have it completed.  He does not have a list of his meds either but states "everything is the same".  Ninetta Lights, CMA  Med list verified with patient.   I have personally reviewed the Medicare Annual Wellness questionnaire and have noted 1. The patient's medical and social history 2. Their use of alcohol, tobacco or illicit drugs 3. Their current medications and supplements 4. The patient's functional ability including ADL's, fall risks, home safety risks and hearing or visual             impairment. 5. Diet and physical activities 6. Evidence for depression or mood disorders  The patients weight, height, BMI have been recorded in the chart and visual acuity is per eye clinic.  I have made referrals, counseling and provided education to the patient based review of the above and I have provided the pt with a written personalized care plan for preventive services.  See scanned forms.  Routine anticipatory guidance given to patient.  See health maintenance. Flu 2013 Shingles d/w pt.  PNA d/w pt Tetanus 2005 Colonoscopy 2012 Prostate cancer screening per urology Advance directive d/w pt.  Wife designated if incapacitated.  Cognitive function addressed- see scanned forms- and if abnormal then additional documentation follows.  Fall risk clarified with patient- isolated incident prev and d/w pt about fall cautions.  BPH per urology.    H/o PVD.  He feels better and stronger in his legs after angioplasty.  No rest pain. No ulceration in the feet now.   Diabetes:  No meds Hypoglycemic episodes:no Hyperglycemic episodes:no Feet problems:not now, see above Blood Sugars averaging: 90-110  AF- no bleeding.  He occ feels an irregular pulse. No CP/SOB/BLE edema.   H/o CKD and labs d/w pt. Cr stable.  No BLE edema.  No recent changes in meds.    PMH and SH reviewed  Meds, vitals, and allergies reviewed.   ROS: See HPI.  Otherwise  negative.    GEN: nad, alert and oriented HEENT: mucous membranes moist NECK: supple w/o LA CV: sounds to be rrr w/o ectopy today.  PULM: ctab, no inc wob ABD: soft, +bs EXT: no edema SKIN: no acute rash  Diabetic foot exam: Normal inspection No skin breakdown Small callus on ball of R foot 1+ DP pulses Dec sensation to light touch and monofilament

## 2012-10-10 ENCOUNTER — Ambulatory Visit (INDEPENDENT_AMBULATORY_CARE_PROVIDER_SITE_OTHER): Payer: Medicare Other | Admitting: *Deleted

## 2012-10-10 DIAGNOSIS — I4892 Unspecified atrial flutter: Secondary | ICD-10-CM

## 2012-10-10 DIAGNOSIS — Z7901 Long term (current) use of anticoagulants: Secondary | ICD-10-CM

## 2012-10-10 DIAGNOSIS — I4891 Unspecified atrial fibrillation: Secondary | ICD-10-CM

## 2012-10-10 DIAGNOSIS — Z0000 Encounter for general adult medical examination without abnormal findings: Secondary | ICD-10-CM | POA: Insufficient documentation

## 2012-10-10 LAB — POCT INR: INR: 2.2

## 2012-10-10 NOTE — Assessment & Plan Note (Signed)
No meds.  Continue as is for now. Recheck later in 2014.  D/w pt about diet and A1c.

## 2012-10-10 NOTE — Assessment & Plan Note (Signed)
No reason to stop coumadin at this point.  CP free and rate is reasonable.

## 2012-10-10 NOTE — Assessment & Plan Note (Signed)
Per uro.  

## 2012-10-10 NOTE — Assessment & Plan Note (Signed)
See scanned forms.  Routine anticipatory guidance given to patient.  See health maintenance. Flu 2013 Shingles d/w pt.  PNA d/w pt Tetanus 2005 Colonoscopy 2012 Prostate cancer screening per urology Advance directive d/w pt.  Wife designated if incapacitated.  Cognitive function addressed- see scanned forms- and if abnormal then additional documentation follows.  Fall risk clarified with patient- isolated incident prev and d/w pt about fall cautions.

## 2012-10-10 NOTE — Assessment & Plan Note (Signed)
Cr stable.  No change in meds.

## 2012-10-10 NOTE — Assessment & Plan Note (Signed)
Improved exercise tolerance and no ulceration now.

## 2012-10-17 ENCOUNTER — Other Ambulatory Visit: Payer: Self-pay

## 2012-10-17 MED ORDER — ATORVASTATIN CALCIUM 20 MG PO TABS: 20.0000 mg | ORAL_TABLET | Freq: Every day | ORAL | Status: DC

## 2012-10-17 NOTE — Telephone Encounter (Signed)
pts wife request refill atorvastatin 20 mg to [facility]. Advised pts wife # [phone] x 1 done.

## 2012-11-07 ENCOUNTER — Ambulatory Visit (INDEPENDENT_AMBULATORY_CARE_PROVIDER_SITE_OTHER): Payer: Medicare Other | Admitting: Pharmacist

## 2012-11-07 DIAGNOSIS — Z7901 Long term (current) use of anticoagulants: Secondary | ICD-10-CM

## 2012-11-07 DIAGNOSIS — I4891 Unspecified atrial fibrillation: Secondary | ICD-10-CM

## 2012-11-07 DIAGNOSIS — I4892 Unspecified atrial flutter: Secondary | ICD-10-CM

## 2012-11-07 LAB — POCT INR: INR: 2.3

## 2012-11-29 ENCOUNTER — Encounter: Payer: Self-pay | Admitting: Cardiology

## 2012-11-29 ENCOUNTER — Ambulatory Visit (INDEPENDENT_AMBULATORY_CARE_PROVIDER_SITE_OTHER): Payer: Medicare Other

## 2012-11-29 ENCOUNTER — Ambulatory Visit (INDEPENDENT_AMBULATORY_CARE_PROVIDER_SITE_OTHER): Payer: Medicare Other | Admitting: Cardiology

## 2012-11-29 VITALS — BP 124/68 | HR 55 | Ht 65.0 in | Wt 187.0 lb

## 2012-11-29 DIAGNOSIS — Z7901 Long term (current) use of anticoagulants: Secondary | ICD-10-CM

## 2012-11-29 DIAGNOSIS — I2510 Atherosclerotic heart disease of native coronary artery without angina pectoris: Secondary | ICD-10-CM

## 2012-11-29 DIAGNOSIS — I4892 Unspecified atrial flutter: Secondary | ICD-10-CM

## 2012-11-29 DIAGNOSIS — I739 Peripheral vascular disease, unspecified: Secondary | ICD-10-CM

## 2012-11-29 DIAGNOSIS — I4891 Unspecified atrial fibrillation: Secondary | ICD-10-CM

## 2012-11-29 DIAGNOSIS — E1159 Type 2 diabetes mellitus with other circulatory complications: Secondary | ICD-10-CM

## 2012-11-29 DIAGNOSIS — I10 Essential (primary) hypertension: Secondary | ICD-10-CM

## 2012-11-29 DIAGNOSIS — E1151 Type 2 diabetes mellitus with diabetic peripheral angiopathy without gangrene: Secondary | ICD-10-CM

## 2012-11-29 DIAGNOSIS — G4733 Obstructive sleep apnea (adult) (pediatric): Secondary | ICD-10-CM

## 2012-11-29 LAB — POCT INR: INR: 2.1

## 2012-11-29 NOTE — Assessment & Plan Note (Signed)
Stable. Continue secondary preventative therapy. 

## 2012-11-29 NOTE — Progress Notes (Signed)
HPI [name] comes in today for evaluation and management of his coronary artery disease, history of coronary bypass grafting, history of chronic A. fib, history of anticoagulation, history of atrial flutter status post ablation, and peripheral vascular disease.  He's currently having no angina or ischemic symptoms. He denies any claudication. He denies any bleeding or melena. He denies any presyncope, palpitations.  Past Medical History  Diagnosis Date  . CAD (coronary artery disease)     cath 2003, occluded S-RCA, occluded S-Dx, L-LAD ok, s/p PTCA to LAD  . Atrial fibrillation   . Atrial flutter     s/p RFCA  . Long term (current) use of anticoagulants   . Pure hyperglyceridemia   . Unspecified essential hypertension   . Diabetes mellitus   . OSA (obstructive sleep apnea) 12/11    very mild, AHI 7/hr  . PVD (peripheral vascular disease)     angioplasty of his right lower extremity in [place] by Dr.[name] 2013    Current Outpatient Prescriptions  Medication Sig Dispense Refill  . acetaminophen (TYLENOL) 500 MG tablet 2 tablets by mouth as needed pain       . amLODipine (NORVASC) 5 MG tablet Take 0.5 tablets (2.5 mg total) by mouth every evening.  90 tablet  1  . aspirin 81 MG tablet Take 81 mg by mouth daily.        [name] atorvastatin (LIPITOR) 20 MG tablet Take 1 tablet (20 mg total) by mouth daily.  90 tablet  1  . B Complex Vitamins (B COMPLEX-B12) TABS Take 1 tablet by mouth. 1000 mcg daily       . cholecalciferol (VITAMIN D) 1000 UNITS tablet Take 1,000 Units by mouth 2 (two) times a week.        [name] CINNAMON PO Take by mouth daily.        . fenofibrate 160 MG tablet Take 1 tablet (160 mg total) by mouth daily.  90 tablet  1  . finasteride (PROSCAR) 5 MG tablet Take 1 tablet by mouth daily.      . fish oil-omega-3 fatty acids 1000 MG capsule Take 2 g by mouth 3 (three) times daily.       . furosemide (LASIX) 20 MG tablet Take 1 tablet (20 mg total) by mouth daily.  90 tablet  1   . lisinopril (PRINIVIL,ZESTRIL) 20 MG tablet Take 1 tablet (20 mg total) by mouth daily.  90 tablet  1  . metoprolol (LOPRESSOR) 100 MG tablet Take 1 tablet (100 mg total) by mouth 2 (two) times daily.  180 tablet  1  . Multiple Vitamin (DAILY MULTIVITAMIN PO) Take 1 tablet by mouth daily.        . Tamsulosin HCl (FLOMAX) 0.4 MG CAPS Take 0.4 mg by mouth daily after breakfast.      . warfarin (COUMADIN) 5 MG tablet As directed       No current facility-administered medications for this visit.    Allergies  Allergen Reactions  . Isosorbide Mononitrate     REACTION: unspecified  . Quinidine Gluconate     REACTION: unspecified  . Ramipril     REACTION: cough    Family History  Problem Relation Age of Onset  . Stroke Father   . Aneurysm Father   . Hypertension Father   . Prostate cancer Brother   . Hypertension Mother   . Lung cancer Brother     smoker  . Thrombosis Brother   . Other Brother  RF valve disorder (smoker)  . Lung cancer Brother     smoker  . Other Sister     cerebral hemm  . Breast cancer Sister     History   Social History  . Marital Status: Married    Spouse Name: N/A    Number of Children: 5  . Years of Education: N/A   Occupational History  . [employer]    Social History Main Topics  . Smoking status: Never Smoker   . Smokeless tobacco: Former Neurosurgeon  . Alcohol Use: No  . Drug Use: No  . Sexually Active: Yes   Other Topics Concern  . Not on file   Social History Narrative   Retired now does Air traffic controller   Retired from Architect and dye work   Married 1951   5 kids    ROS ALL NEGATIVE EXCEPT THOSE NOTED IN HPI  PE  General Appearance: well developed, well nourished in no acute distress, overweight HEENT: symmetrical face, PERRLA, good dentition  Neck: no JVD, thyromegaly, or adenopathy, trachea midline Chest: symmetric without deformity Cardiac: PMI non-displaced, irregular rate and rhythm, normal S1, S2, no gallop or  murmur Lung: clear to ausculation and percussion Vascular: all pulses full without bruits  Abdominal: nondistended, nontender, good bowel sounds,  Extremities: no cyanosis, clubbing or edema, no sign of DVT, no varicosities  Skin: normal color, no rashes Neuro: alert and oriented x 3, non-focal Pysch: normal affect  EKG Chronic A. fib with a well-controlled ventricular response, nonspecific ST segment changes, no acute changes. BMET    Component Value Date/Time   NA 138 09/30/2012 0838   K 4.7 09/30/2012 0838   CL 106 09/30/2012 0838   CO2 27 09/30/2012 0838   GLUCOSE 101* 09/30/2012 0838   BUN 23 09/30/2012 0838   CREATININE 1.5 09/30/2012 0838   CALCIUM 9.6 09/30/2012 0838   GFRNONAA 41* 08/26/2010 1146   GFRAA  Value: 50        The eGFR has been calculated using the MDRD equation. This calculation has not been validated in all clinical situations. eGFR's persistently <60 mL/min signify possible Chronic Kidney Disease.* 08/26/2010 1146    Lipid Panel     Component Value Date/Time   CHOL 124 09/30/2012 0838   TRIG 123.0 09/30/2012 0838   HDL 25.90* 09/30/2012 0838   CHOLHDL 5 09/30/2012 0838   VLDL 24.6 09/30/2012 0838   LDLCALC 74 09/30/2012 0838    CBC    Component Value Date/Time   WBC 8.9 09/30/2012 0838   RBC 4.38 09/30/2012 0838   HGB 12.9* 09/30/2012 0838   HCT 39.4 09/30/2012 0838   PLT 166.0 09/30/2012 0838   MCV 90.0 09/30/2012 0838   MCHC 32.8 09/30/2012 0838   RDW 16.5* 09/30/2012 0838   LYMPHSABS 1.7 09/30/2012 0838   MONOABS 0.8 09/30/2012 0838   EOSABS 0.2 09/30/2012 0838   BASOSABS 0.1 09/30/2012 1478

## 2012-11-29 NOTE — Patient Instructions (Addendum)
Your physician wants you to follow-up in: 6 months with Dr.Crenshaw. You will receive a reminder letter in the mail two months in advance. If you don't receive a letter, please call our office to schedule the follow-up appointment.  

## 2012-11-29 NOTE — Assessment & Plan Note (Signed)
Status post ablation. Now chronic A. fib.

## 2012-11-29 NOTE — Assessment & Plan Note (Signed)
Under good control. No change in current therapy.

## 2012-11-29 NOTE — Assessment & Plan Note (Signed)
Continue rate control and anticoagulation. Followup Dr. Jens Som.

## 2013-01-10 ENCOUNTER — Ambulatory Visit (INDEPENDENT_AMBULATORY_CARE_PROVIDER_SITE_OTHER): Payer: Medicare Other

## 2013-01-10 DIAGNOSIS — I4892 Unspecified atrial flutter: Secondary | ICD-10-CM

## 2013-01-10 DIAGNOSIS — I4891 Unspecified atrial fibrillation: Secondary | ICD-10-CM

## 2013-01-10 DIAGNOSIS — Z7901 Long term (current) use of anticoagulants: Secondary | ICD-10-CM

## 2013-01-10 LAB — POCT INR: INR: 1.9

## 2013-02-16 ENCOUNTER — Other Ambulatory Visit: Payer: Self-pay

## 2013-02-21 ENCOUNTER — Ambulatory Visit (INDEPENDENT_AMBULATORY_CARE_PROVIDER_SITE_OTHER): Payer: Self-pay | Admitting: *Deleted

## 2013-02-21 DIAGNOSIS — I4891 Unspecified atrial fibrillation: Secondary | ICD-10-CM

## 2013-02-21 DIAGNOSIS — I4892 Unspecified atrial flutter: Secondary | ICD-10-CM

## 2013-02-21 DIAGNOSIS — Z7901 Long term (current) use of anticoagulants: Secondary | ICD-10-CM

## 2013-02-21 LAB — POCT INR: INR: 2.6

## 2013-03-10 ENCOUNTER — Encounter: Payer: Self-pay | Admitting: Family Medicine

## 2013-03-28 ENCOUNTER — Other Ambulatory Visit: Payer: Self-pay | Admitting: *Deleted

## 2013-03-28 MED ORDER — FUROSEMIDE 20 MG PO TABS: 20.0000 mg | ORAL_TABLET | Freq: Every day | ORAL | Status: DC

## 2013-03-28 MED ORDER — FENOFIBRATE 160 MG PO TABS: 160.0000 mg | ORAL_TABLET | Freq: Every day | ORAL | Status: DC

## 2013-03-28 MED ORDER — LISINOPRIL 20 MG PO TABS: 20.0000 mg | ORAL_TABLET | Freq: Every day | ORAL | Status: DC

## 2013-03-28 MED ORDER — ATORVASTATIN CALCIUM 20 MG PO TABS: 20.0000 mg | ORAL_TABLET | Freq: Every day | ORAL | Status: DC

## 2013-04-03 ENCOUNTER — Other Ambulatory Visit (INDEPENDENT_AMBULATORY_CARE_PROVIDER_SITE_OTHER): Payer: Medicare Other

## 2013-04-03 DIAGNOSIS — E119 Type 2 diabetes mellitus without complications: Secondary | ICD-10-CM

## 2013-04-03 LAB — HEMOGLOBIN A1C: Hgb A1c MFr Bld: 6.7 % — ABNORMAL HIGH (ref 4.6–6.5)

## 2013-04-04 ENCOUNTER — Ambulatory Visit (INDEPENDENT_AMBULATORY_CARE_PROVIDER_SITE_OTHER): Payer: Medicare Other | Admitting: *Deleted

## 2013-04-04 ENCOUNTER — Telehealth: Payer: Self-pay | Admitting: Cardiology

## 2013-04-04 DIAGNOSIS — I4891 Unspecified atrial fibrillation: Secondary | ICD-10-CM

## 2013-04-04 DIAGNOSIS — Z7901 Long term (current) use of anticoagulants: Secondary | ICD-10-CM

## 2013-04-04 DIAGNOSIS — I4892 Unspecified atrial flutter: Secondary | ICD-10-CM

## 2013-04-04 LAB — POCT INR: INR: 2.1

## 2013-04-04 NOTE — Telephone Encounter (Signed)
New problem   [facility] want to know if it's ok to stop coumadin for 5 day and stay on Asprin

## 2013-04-04 NOTE — Telephone Encounter (Signed)
Pt is having cooled microwave thermotherapy. Pt has a follow up appt with dr Jens Som 05-29-13. His procedure is scheduled for 04-17-13. Will forward for dr Jens Som review

## 2013-04-06 ENCOUNTER — Ambulatory Visit: Payer: Medicare Other | Admitting: Family Medicine

## 2013-04-11 ENCOUNTER — Encounter: Payer: Self-pay | Admitting: Family Medicine

## 2013-04-11 ENCOUNTER — Ambulatory Visit (INDEPENDENT_AMBULATORY_CARE_PROVIDER_SITE_OTHER): Payer: Medicare Other | Admitting: Family Medicine

## 2013-04-11 VITALS — BP 124/68 | HR 63 | Temp 98.2°F | Ht 65.0 in | Wt 189.5 lb

## 2013-04-11 DIAGNOSIS — I10 Essential (primary) hypertension: Secondary | ICD-10-CM

## 2013-04-11 DIAGNOSIS — Z23 Encounter for immunization: Secondary | ICD-10-CM

## 2013-04-11 DIAGNOSIS — E1159 Type 2 diabetes mellitus with other circulatory complications: Secondary | ICD-10-CM

## 2013-04-11 DIAGNOSIS — E1151 Type 2 diabetes mellitus with diabetic peripheral angiopathy without gangrene: Secondary | ICD-10-CM

## 2013-04-11 DIAGNOSIS — I739 Peripheral vascular disease, unspecified: Secondary | ICD-10-CM

## 2013-04-11 NOTE — Patient Instructions (Addendum)
Call the foot clinic about getting seen.  Stop the amlodipine.  See if you have less lightheaded episodes.  Recheck in 6 months at a physical.

## 2013-04-11 NOTE — Assessment & Plan Note (Addendum)
Advised to f/u with the foot clinic.  He'll work on that.  He understands that he needs f/u about this. A1c controlled. No new meds for DM2. Recheck in about 6 months.

## 2013-04-11 NOTE — Progress Notes (Signed)
Diabetes:  No meds Hypoglycemic episodes: no Hyperglycemic episodes:no Feet problems:see below Blood Sugars averaging: ~95, usually in the 90s eye exam within last year:yes A1c <7.   Hypertension:    Using medication without problems or lightheadedness: occ lightheaded with position changes Chest pain with exertion: no Edema: no Short of breath:no  Vital signs, Meds and allergies reviewed.  ROS: See HPI.  Otherwise nontributory.   GEN: nad, alert and oriented HEENT: mucous membranes moist NECK: supple w/o LA CV: IRR PULM: ctab, no inc wob ABD: soft, +bs EXT: trace edema SKIN: no acute rash  Diabetic foot exam: Skin breakdown noted on R 1st toe, shallow 3mm ulcer on the plantar side of R 1st toe Calluses noted on R foot, at the 4th toe Decreased DP pulses Normal sensation to light tough and monofilament Nails thickened

## 2013-04-11 NOTE — Assessment & Plan Note (Signed)
Stop amlodipine since he has been lightheaded. He agrees.  Continue other meds. He has f/u with cards pending.

## 2013-04-13 ENCOUNTER — Ambulatory Visit (INDEPENDENT_AMBULATORY_CARE_PROVIDER_SITE_OTHER): Payer: Medicare Other | Admitting: Cardiology

## 2013-04-13 ENCOUNTER — Encounter: Payer: Self-pay | Admitting: Cardiology

## 2013-04-13 VITALS — BP 146/66 | HR 59 | Wt 188.0 lb

## 2013-04-13 DIAGNOSIS — I4891 Unspecified atrial fibrillation: Secondary | ICD-10-CM

## 2013-04-13 DIAGNOSIS — I10 Essential (primary) hypertension: Secondary | ICD-10-CM

## 2013-04-13 DIAGNOSIS — E781 Pure hyperglyceridemia: Secondary | ICD-10-CM

## 2013-04-13 DIAGNOSIS — I739 Peripheral vascular disease, unspecified: Secondary | ICD-10-CM

## 2013-04-13 DIAGNOSIS — Z01810 Encounter for preprocedural cardiovascular examination: Secondary | ICD-10-CM

## 2013-04-13 DIAGNOSIS — I4892 Unspecified atrial flutter: Secondary | ICD-10-CM

## 2013-04-13 DIAGNOSIS — I2510 Atherosclerotic heart disease of native coronary artery without angina pectoris: Secondary | ICD-10-CM

## 2013-04-13 NOTE — Assessment & Plan Note (Signed)
Continue statin. 

## 2013-04-13 NOTE — Telephone Encounter (Signed)
Follow up  Eric [name] w/ urology calling back about coumadin... Was told to advise Eric [name] that a response will be given after the pt is seen today at 11:15am.

## 2013-04-13 NOTE — Progress Notes (Signed)
HPI: 79 year old male previously followed by Dr. Daleen Squibb for followup of atrial fibrillation, coronary artery disease and preoperative evaluation. Patient is status post coronary artery bypassing graft in 2000 with a LIMA to the LAD, saphenous vein graft to the diagonal and saphenous vein graft to the PDA. Cardiac catheterization in 2003 revealed normal left main, occluded LAD after the first diagonal, 80% LAD leading into the diagonal, 50% ramus, 50% obtuse marginal 70% mid PDA and an 80% right coronary artery. The saphenous vein graft to the right coronary and diagonal were occluded. The LIMA to the LAD was patent. LV function was normal. Patient had PCI of the LAD at that time. Last nuclear study in April of 2011 showed an ejection fraction of 62% and normal perfusion. Patient also with peripheral vascular disease and has had prior angioplasty of his right lower extremity in [facility] 2013. He also has permanent atrial fibrillation. Since he was last seen he has some dyspnea on exertion which is unchanged. No orthopnea, PND, pedal edema, syncope or exertional chest pain. He is scheduled for urological surgery and cardiology was asked to evaluate preoperatively.  Current Outpatient Prescriptions  Medication Sig Dispense Refill  . acetaminophen (TYLENOL) 500 MG tablet 2 tablets by mouth as needed pain       . aspirin 81 MG tablet Take 81 mg by mouth daily.        [name] atorvastatin (LIPITOR) 20 MG tablet Take 1 tablet (20 mg total) by mouth daily.  90 tablet  1  . B Complex Vitamins (B COMPLEX-B12) TABS Take 1 tablet by mouth. 1000 mcg daily       . cholecalciferol (VITAMIN D) 1000 UNITS tablet Take 1,000 Units by mouth 2 (two) times a week.        [name] CINNAMON PO Take by mouth 2 (two) times daily.       . fenofibrate 160 MG tablet Take 1 tablet (160 mg total) by mouth daily.  90 tablet  1  . finasteride (PROSCAR) 5 MG tablet Take 1 tablet by mouth daily.      . fish oil-omega-3 fatty acids 1000 MG capsule  Take 2 g by mouth 3 (three) times daily.       . furosemide (LASIX) 20 MG tablet Take 1 tablet (20 mg total) by mouth daily.  90 tablet  1  . lisinopril (PRINIVIL,ZESTRIL) 20 MG tablet Take 1 tablet (20 mg total) by mouth daily.  90 tablet  1  . metoprolol (LOPRESSOR) 100 MG tablet Take 1 tablet (100 mg total) by mouth 2 (two) times daily.  180 tablet  1  . Multiple Vitamin (DAILY MULTIVITAMIN PO) Take 1 tablet by mouth daily.        . Tamsulosin HCl (FLOMAX) 0.4 MG CAPS Take 0.4 mg by mouth daily after breakfast.      . warfarin (COUMADIN) 5 MG tablet As directed       No current facility-administered medications for this visit.     Past Medical History  Diagnosis Date  . CAD (coronary artery disease)     cath 2003, occluded S-RCA, occluded S-Dx, L-LAD ok, s/p PTCA to LAD  . Atrial fibrillation   . Atrial flutter     s/p RFCA  . Long term (current) use of anticoagulants   . Pure hyperglyceridemia   . Unspecified essential hypertension   . Diabetes mellitus   . OSA (obstructive sleep apnea) 12/11    very mild, AHI 7/hr  . PVD (peripheral vascular  disease)     angioplasty of his right lower extremity in [place] by Dr.[name] 2013    Past Surgical History  Procedure Laterality Date  . Nm myoview ltd  4/11    normal  . Angioplasty  1/99    CAD- diogonal with rotational artherectomy  . Carpal tunnel release      ? bilateral  . Cardiac catheterization  1/00  . Arthrectomy      of LAD & PTCA  . Coronary artery bypass graft  2000  . Ptca  4/03    admit unstable angina  . Cardioversion  1/04  . Cardioversion  5/07    hospital- a flutter  . Adenosine myoview  3/06    EF 56%, neg. Ischemia  . Adenosine myoview  02/18/07    nml  . Hand surgery  08/27/09    R thumb procedure wit Scaphoid Gragt and screws, Dr Merlyn Lot  . Colonoscopy w/ biopsies  10/01/06    sigmoid polyp bx neg, 3 years    History   Social History  . Marital Status: Married    Spouse Name: N/A    Number of  Children: 5  . Years of Education: N/A   Occupational History  . [employer]    Social History Main Topics  . Smoking status: Never Smoker   . Smokeless tobacco: Former Neurosurgeon  . Alcohol Use: No  . Drug Use: No  . Sexual Activity: Yes   Other Topics Concern  . Not on file   Social History Narrative   Retired now does antiques   Retired from Architect and dye work   Married 1951   5 kids    ROS: no fevers or chills, productive cough, hemoptysis, dysphasia, odynophagia, melena, hematochezia, dysuria, hematuria, rash, seizure activity, orthopnea, PND, pedal edema, claudication. Remaining systems are negative.  Physical Exam: Well-developed well-nourished in no acute distress.  Skin is warm and dry.  HEENT is normal.  Neck is supple.  Chest is clear to auscultation with normal expansion.  Cardiovascular exam is irregular Abdominal exam nontender or distended. No masses palpated. Extremities show no edema. neuro grossly intact  ECG atrial fibrillation at a rate of 59. No significant ST changes.

## 2013-04-13 NOTE — Assessment & Plan Note (Signed)
Plan continue beta blocker for rate control. Continue Coumadin. I have given him the name of apixaban. He will check with his insurance company and if it is covered he will consider changing.

## 2013-04-13 NOTE — Assessment & Plan Note (Signed)
Last nuclear study showed no ischemia. No exertional chest pain. Patient may proceed with surgery without further cardiac evaluation. Hold Coumadin 4 days prior to procedure and resume date of procedure.

## 2013-04-13 NOTE — Assessment & Plan Note (Signed)
Continue present blood pressure medications. 

## 2013-04-13 NOTE — Patient Instructions (Addendum)
Your physician wants you to follow-up in: 6 MONTHS WITH DR Jens Som You will receive a reminder letter in the mail two months in advance. If you don't receive a letter, please call our office to schedule the follow-up appointment.   ELIQUIS TO REPLACE WARFARIN  STOP ASPIRIN AFTER RESTART COUMADIN

## 2013-04-13 NOTE — Assessment & Plan Note (Signed)
Continue present medications. 

## 2013-04-13 NOTE — Telephone Encounter (Signed)
Office note containing clearance and directions for coumadin placed in medical records for faxing.

## 2013-04-13 NOTE — Assessment & Plan Note (Signed)
History of ablation. 

## 2013-04-13 NOTE — Assessment & Plan Note (Signed)
Continue statin. Discontinue aspirin given need for Coumadin. 

## 2013-05-01 ENCOUNTER — Other Ambulatory Visit: Payer: Self-pay | Admitting: *Deleted

## 2013-05-01 MED ORDER — METOPROLOL TARTRATE 100 MG PO TABS: 100.0000 mg | ORAL_TABLET | Freq: Two times a day (BID) | ORAL | Status: DC

## 2013-05-16 ENCOUNTER — Ambulatory Visit (INDEPENDENT_AMBULATORY_CARE_PROVIDER_SITE_OTHER): Payer: Medicare Other | Admitting: General Practice

## 2013-05-16 DIAGNOSIS — Z7901 Long term (current) use of anticoagulants: Secondary | ICD-10-CM

## 2013-05-16 DIAGNOSIS — I4891 Unspecified atrial fibrillation: Secondary | ICD-10-CM

## 2013-05-16 DIAGNOSIS — I4892 Unspecified atrial flutter: Secondary | ICD-10-CM

## 2013-05-16 LAB — POCT INR: INR: 1.7

## 2013-05-29 ENCOUNTER — Encounter: Payer: Medicare Other | Admitting: Cardiology

## 2013-05-30 ENCOUNTER — Ambulatory Visit (INDEPENDENT_AMBULATORY_CARE_PROVIDER_SITE_OTHER): Payer: Medicare Other | Admitting: *Deleted

## 2013-05-30 DIAGNOSIS — I4891 Unspecified atrial fibrillation: Secondary | ICD-10-CM

## 2013-05-30 DIAGNOSIS — I4892 Unspecified atrial flutter: Secondary | ICD-10-CM

## 2013-05-30 DIAGNOSIS — Z7901 Long term (current) use of anticoagulants: Secondary | ICD-10-CM

## 2013-05-30 LAB — POCT INR: INR: 2.7

## 2013-06-27 ENCOUNTER — Ambulatory Visit (INDEPENDENT_AMBULATORY_CARE_PROVIDER_SITE_OTHER): Payer: Medicare Other | Admitting: *Deleted

## 2013-06-27 DIAGNOSIS — I4891 Unspecified atrial fibrillation: Secondary | ICD-10-CM

## 2013-06-27 DIAGNOSIS — I4892 Unspecified atrial flutter: Secondary | ICD-10-CM

## 2013-06-27 DIAGNOSIS — Z7901 Long term (current) use of anticoagulants: Secondary | ICD-10-CM

## 2013-07-25 ENCOUNTER — Ambulatory Visit (INDEPENDENT_AMBULATORY_CARE_PROVIDER_SITE_OTHER): Payer: Medicare Other | Admitting: General Practice

## 2013-07-25 DIAGNOSIS — Z7901 Long term (current) use of anticoagulants: Secondary | ICD-10-CM

## 2013-07-25 DIAGNOSIS — I4891 Unspecified atrial fibrillation: Secondary | ICD-10-CM

## 2013-07-25 DIAGNOSIS — I4892 Unspecified atrial flutter: Secondary | ICD-10-CM

## 2013-07-25 LAB — POCT INR: INR: 2.1

## 2013-09-05 ENCOUNTER — Ambulatory Visit (INDEPENDENT_AMBULATORY_CARE_PROVIDER_SITE_OTHER): Payer: Medicare Other | Admitting: Pharmacist

## 2013-09-05 DIAGNOSIS — I4891 Unspecified atrial fibrillation: Secondary | ICD-10-CM

## 2013-09-05 DIAGNOSIS — I4892 Unspecified atrial flutter: Secondary | ICD-10-CM

## 2013-09-05 DIAGNOSIS — Z7901 Long term (current) use of anticoagulants: Secondary | ICD-10-CM

## 2013-09-05 DIAGNOSIS — Z5181 Encounter for therapeutic drug level monitoring: Secondary | ICD-10-CM

## 2013-09-05 LAB — POCT INR: INR: 2.6

## 2013-09-22 ENCOUNTER — Other Ambulatory Visit: Payer: Self-pay | Admitting: *Deleted

## 2013-09-22 MED ORDER — LISINOPRIL 20 MG PO TABS: 20.0000 mg | ORAL_TABLET | Freq: Every day | ORAL | Status: DC

## 2013-09-22 MED ORDER — FENOFIBRATE 160 MG PO TABS: 160.0000 mg | ORAL_TABLET | Freq: Every day | ORAL | Status: DC

## 2013-09-22 MED ORDER — FUROSEMIDE 20 MG PO TABS: 20.0000 mg | ORAL_TABLET | Freq: Every day | ORAL | Status: DC

## 2013-10-01 ENCOUNTER — Other Ambulatory Visit: Payer: Self-pay | Admitting: Family Medicine

## 2013-10-01 DIAGNOSIS — E1140 Type 2 diabetes mellitus with diabetic neuropathy, unspecified: Secondary | ICD-10-CM

## 2013-10-01 DIAGNOSIS — I4891 Unspecified atrial fibrillation: Secondary | ICD-10-CM

## 2013-10-01 DIAGNOSIS — E538 Deficiency of other specified B group vitamins: Secondary | ICD-10-CM

## 2013-10-03 ENCOUNTER — Other Ambulatory Visit: Payer: Medicare Other

## 2013-10-10 ENCOUNTER — Encounter: Payer: Medicare Other | Admitting: Family Medicine

## 2013-10-10 ENCOUNTER — Other Ambulatory Visit (INDEPENDENT_AMBULATORY_CARE_PROVIDER_SITE_OTHER): Payer: Medicare Other

## 2013-10-10 DIAGNOSIS — I4891 Unspecified atrial fibrillation: Secondary | ICD-10-CM

## 2013-10-10 DIAGNOSIS — E1142 Type 2 diabetes mellitus with diabetic polyneuropathy: Secondary | ICD-10-CM

## 2013-10-10 DIAGNOSIS — E1140 Type 2 diabetes mellitus with diabetic neuropathy, unspecified: Secondary | ICD-10-CM

## 2013-10-10 DIAGNOSIS — E1149 Type 2 diabetes mellitus with other diabetic neurological complication: Secondary | ICD-10-CM

## 2013-10-10 DIAGNOSIS — E781 Pure hyperglyceridemia: Secondary | ICD-10-CM

## 2013-10-10 DIAGNOSIS — I798 Other disorders of arteries, arterioles and capillaries in diseases classified elsewhere: Secondary | ICD-10-CM

## 2013-10-10 DIAGNOSIS — E1151 Type 2 diabetes mellitus with diabetic peripheral angiopathy without gangrene: Secondary | ICD-10-CM

## 2013-10-10 DIAGNOSIS — E1159 Type 2 diabetes mellitus with other circulatory complications: Secondary | ICD-10-CM

## 2013-10-10 DIAGNOSIS — I10 Essential (primary) hypertension: Secondary | ICD-10-CM

## 2013-10-10 DIAGNOSIS — E538 Deficiency of other specified B group vitamins: Secondary | ICD-10-CM

## 2013-10-10 LAB — COMPREHENSIVE METABOLIC PANEL
ALK PHOS: 33 U/L — AB (ref 39–117)
ALT: 20 U/L (ref 0–53)
AST: 28 U/L (ref 0–37)
Albumin: 3.8 g/dL (ref 3.5–5.2)
BUN: 23 mg/dL (ref 6–23)
CALCIUM: 9.9 mg/dL (ref 8.4–10.5)
CHLORIDE: 103 meq/L (ref 96–112)
CO2: 29 mEq/L (ref 19–32)
CREATININE: 1.5 mg/dL (ref 0.4–1.5)
GFR: 48.95 mL/min — ABNORMAL LOW (ref >60.00)
Glucose, Bld: 105 mg/dL — ABNORMAL HIGH (ref 70–99)
Potassium: 4.7 mEq/L (ref 3.5–5.1)
Sodium: 137 mEq/L (ref 135–145)
Total Bilirubin: 0.8 mg/dL (ref 0.3–1.2)
Total Protein: 7.2 g/dL (ref 6.0–8.3)

## 2013-10-10 LAB — LIPID PANEL
Cholesterol: 145 mg/dL (ref 0–200)
HDL: 30.5 mg/dL — AB (ref >39.00)
LDL Cholesterol: 87 mg/dL (ref 0–99)
TRIGLYCERIDES: 139 mg/dL (ref 0.0–149.0)
Total CHOL/HDL Ratio: 5
VLDL: 27.8 mg/dL (ref 0.0–40.0)

## 2013-10-10 LAB — CBC WITH DIFFERENTIAL/PLATELET
BASOS ABS: 0 10*3/uL (ref 0.0–0.1)
Basophils Relative: 0.3 % (ref 0.0–3.0)
EOS ABS: 0.2 10*3/uL (ref 0.0–0.7)
Eosinophils Relative: 2 % (ref 0.0–5.0)
HCT: 43.2 % (ref 39.0–52.0)
HEMOGLOBIN: 14.1 g/dL (ref 13.0–17.0)
LYMPHS PCT: 20.1 % (ref 12.0–46.0)
Lymphs Abs: 1.8 10*3/uL (ref 0.7–4.0)
MCHC: 32.6 g/dL (ref 30.0–36.0)
MCV: 93.2 fl (ref 78.0–100.0)
MONO ABS: 0.6 10*3/uL (ref 0.1–1.0)
Monocytes Relative: 6.9 % (ref 3.0–12.0)
NEUTROS ABS: 6.5 10*3/uL (ref 1.4–7.7)
Neutrophils Relative %: 70.7 % (ref 43.0–77.0)
Platelets: 176 10*3/uL (ref 150.0–400.0)
RBC: 4.64 Mil/uL (ref 4.22–5.81)
RDW: 16.4 % — ABNORMAL HIGH (ref 11.5–14.6)
WBC: 9.1 10*3/uL (ref 4.5–10.5)

## 2013-10-10 LAB — HEMOGLOBIN A1C: Hgb A1c MFr Bld: 6.9 % — ABNORMAL HIGH (ref 4.6–6.5)

## 2013-10-10 LAB — VITAMIN B12: Vitamin B-12: 798 pg/mL (ref 211–911)

## 2013-10-11 ENCOUNTER — Telehealth: Payer: Self-pay | Admitting: Family Medicine

## 2013-10-11 NOTE — Telephone Encounter (Signed)
Relevant patient education mailed to patient.  

## 2013-10-12 ENCOUNTER — Encounter: Payer: Self-pay | Admitting: *Deleted

## 2013-10-12 ENCOUNTER — Ambulatory Visit (INDEPENDENT_AMBULATORY_CARE_PROVIDER_SITE_OTHER): Payer: Medicare Other | Admitting: Cardiology

## 2013-10-12 ENCOUNTER — Encounter: Payer: Self-pay | Admitting: Cardiology

## 2013-10-12 ENCOUNTER — Ambulatory Visit (INDEPENDENT_AMBULATORY_CARE_PROVIDER_SITE_OTHER): Payer: Medicare Other | Admitting: *Deleted

## 2013-10-12 VITALS — BP 150/74 | HR 50 | Ht 65.0 in | Wt 188.0 lb

## 2013-10-12 DIAGNOSIS — I2510 Atherosclerotic heart disease of native coronary artery without angina pectoris: Secondary | ICD-10-CM

## 2013-10-12 DIAGNOSIS — I739 Peripheral vascular disease, unspecified: Secondary | ICD-10-CM

## 2013-10-12 DIAGNOSIS — I4891 Unspecified atrial fibrillation: Secondary | ICD-10-CM

## 2013-10-12 DIAGNOSIS — Z5181 Encounter for therapeutic drug level monitoring: Secondary | ICD-10-CM

## 2013-10-12 DIAGNOSIS — I4892 Unspecified atrial flutter: Secondary | ICD-10-CM

## 2013-10-12 DIAGNOSIS — Z7901 Long term (current) use of anticoagulants: Secondary | ICD-10-CM

## 2013-10-12 LAB — POCT INR: INR: 2.3

## 2013-10-12 NOTE — Assessment & Plan Note (Signed)
Continue present blood pressure medications. 

## 2013-10-12 NOTE — Assessment & Plan Note (Signed)
Continue metoprolol for rate control. Continue Coumadin.

## 2013-10-12 NOTE — Patient Instructions (Signed)
Your physician wants you to follow-up in: ONE YEAR WITH DR CRENSHAW You will receive a reminder letter in the mail two months in advance. If you don't receive a letter, please call our office to schedule the follow-up appointment.   Your physician has requested that you have a lexiscan myoview. For further information please visit [email or web address]. Please follow instruction sheet, as given.   

## 2013-10-12 NOTE — Progress Notes (Signed)
HPI: FU atrial fibrillation, coronary artery disease. Patient is status post coronary artery bypassing graft in 2000 with a LIMA to the LAD, saphenous vein graft to the diagonal and saphenous vein graft to the PDA. Cardiac catheterization in 2003 revealed normal left main, occluded LAD after the first diagonal, 80% LAD leading into the diagonal, 50% ramus, 50% obtuse marginal 70% mid PDA and an 80% right coronary artery. The saphenous vein graft to the right coronary and diagonal were occluded. The LIMA to the LAD was patent. LV function was normal. Patient had PCI of the LAD at that time. Last nuclear study in April of 2011 showed an ejection fraction of 62% and normal perfusion. Patient also with peripheral vascular disease and has had prior angioplasty of his right lower extremity in [place] 2013. He also has permanent atrial fibrillation. Since he was last seen he has dyspnea with more extreme activities. Occasional brief chest pain for several minutes not related to activities. No associated symptoms. Occasional mild pedal edema.   Current Outpatient Prescriptions  Medication Sig Dispense Refill  . acetaminophen (TYLENOL) 500 MG tablet 2 tablets by mouth as needed pain       . atorvastatin (LIPITOR) 20 MG tablet Take 1 tablet (20 mg total) by mouth daily.  90 tablet  1  . B Complex Vitamins (B COMPLEX-B12) TABS Take 1 tablet by mouth. 1000 mcg daily       . cholecalciferol (VITAMIN D) 1000 UNITS tablet Take 1,000 Units by mouth 2 (two) times a week.        [name] CINNAMON PO Take by mouth 2 (two) times daily.       . fenofibrate 160 MG tablet Take 1 tablet (160 mg total) by mouth daily.  90 tablet  0  . fish oil-omega-3 fatty acids 1000 MG capsule Take 2 g by mouth 3 (three) times daily.       . furosemide (LASIX) 20 MG tablet Take 1 tablet (20 mg total) by mouth daily.  90 tablet  0  . lisinopril (PRINIVIL,ZESTRIL) 20 MG tablet Take 1 tablet (20 mg total) by mouth daily.  90 tablet  0  .  metoprolol (LOPRESSOR) 100 MG tablet Take 1 tablet (100 mg total) by mouth 2 (two) times daily.  180 tablet  1  . Multiple Vitamin (DAILY MULTIVITAMIN PO) Take 1 tablet by mouth daily.        [name] warfarin (COUMADIN) 5 MG tablet As directed       No current facility-administered medications for this visit.     Past Medical History  Diagnosis Date  . CAD (coronary artery disease)     cath 2003, occluded S-RCA, occluded S-Dx, L-LAD ok, s/p PTCA to LAD  . Atrial fibrillation   . Atrial flutter     s/p RFCA  . Long term (current) use of anticoagulants   . Pure hyperglyceridemia   . Unspecified essential hypertension   . Diabetes mellitus   . OSA (obstructive sleep apnea) 12/11    very mild, AHI 7/hr  . PVD (peripheral vascular disease)     angioplasty of his right lower extremity in [place] by Dr.[name] 2013    Past Surgical History  Procedure Laterality Date  . Nm myoview ltd  4/11    normal  . Angioplasty  1/99    CAD- diogonal with rotational artherectomy  . Carpal tunnel release      ? bilateral  . Cardiac catheterization  1/00  .  Arthrectomy      of LAD & PTCA  . Coronary artery bypass graft  2000  . Ptca  4/03    admit unstable angina  . Cardioversion  1/04  . Cardioversion  5/07    hospital- a flutter  . Adenosine myoview  3/06    EF 56%, neg. Ischemia  . Adenosine myoview  02/18/07    nml  . Hand surgery  08/27/09    R thumb procedure wit Scaphoid Gragt and screws, Dr [name]  . Colonoscopy w/ biopsies  10/01/06    sigmoid polyp bx neg, 3 years    History   Social History  . Marital Status: Married    Spouse Name: N/A    Number of Children: 14  . Years of Education: N/A   Occupational History  . [employer]    Social History Main Topics  . Smoking status: Never Smoker   . Smokeless tobacco: Former Systems developer  . Alcohol Use: No  . Drug Use: No  . Sexual Activity: Yes   Other Topics Concern  . Not on file   Social History Narrative   Retired now does  antiques   Retired from Theatre manager and dye work   Married 1951   5 kids    ROS: no fevers or chills, productive cough, hemoptysis, dysphasia, odynophagia, melena, hematochezia, dysuria, hematuria, rash, seizure activity, orthopnea, PND, claudication. Remaining systems are negative.  Physical Exam: Well-developed well-nourished in no acute distress.  Skin is warm and dry.  HEENT is normal.  Neck is supple.  Chest is clear to auscultation with normal expansion.  Cardiovascular exam is irregular Abdominal exam nontender or distended. No masses palpated. Extremities show trace edema. neuro grossly intact  ECG Atrial fibrillation at a rate of 50. Nonspecific ST changes.

## 2013-10-12 NOTE — Assessment & Plan Note (Signed)
Continue statin. Lipids and liver monitored by primary care. 

## 2013-10-12 NOTE — Assessment & Plan Note (Signed)
He is having occasional mild chest pain. Plan nuclear study for risk stratification. Continue statin.

## 2013-10-12 NOTE — Assessment & Plan Note (Signed)
Continue statin. Not on aspirin given need for Coumadin. 

## 2013-10-13 ENCOUNTER — Other Ambulatory Visit: Payer: Self-pay | Admitting: *Deleted

## 2013-10-13 MED ORDER — ATORVASTATIN CALCIUM 20 MG PO TABS: 20.0000 mg | ORAL_TABLET | Freq: Every day | ORAL | Status: DC

## 2013-10-17 ENCOUNTER — Encounter: Payer: Self-pay | Admitting: Family Medicine

## 2013-10-17 ENCOUNTER — Ambulatory Visit (INDEPENDENT_AMBULATORY_CARE_PROVIDER_SITE_OTHER): Payer: Medicare Other | Admitting: Family Medicine

## 2013-10-17 VITALS — BP 142/84 | HR 78 | Temp 97.7°F | Ht 65.5 in | Wt 186.5 lb

## 2013-10-17 DIAGNOSIS — I798 Other disorders of arteries, arterioles and capillaries in diseases classified elsewhere: Secondary | ICD-10-CM

## 2013-10-17 DIAGNOSIS — I10 Essential (primary) hypertension: Secondary | ICD-10-CM

## 2013-10-17 DIAGNOSIS — Z0000 Encounter for general adult medical examination without abnormal findings: Secondary | ICD-10-CM

## 2013-10-17 DIAGNOSIS — Z23 Encounter for immunization: Secondary | ICD-10-CM

## 2013-10-17 DIAGNOSIS — E1151 Type 2 diabetes mellitus with diabetic peripheral angiopathy without gangrene: Secondary | ICD-10-CM

## 2013-10-17 DIAGNOSIS — E1159 Type 2 diabetes mellitus with other circulatory complications: Secondary | ICD-10-CM

## 2013-10-17 DIAGNOSIS — N183 Chronic kidney disease, stage 3 unspecified: Secondary | ICD-10-CM

## 2013-10-17 DIAGNOSIS — I4891 Unspecified atrial fibrillation: Secondary | ICD-10-CM

## 2013-10-17 DIAGNOSIS — E781 Pure hyperglyceridemia: Secondary | ICD-10-CM

## 2013-10-17 NOTE — Patient Instructions (Addendum)
Check with your insurance to see if they will cover the shingles shot. Recheck A1c before a visit in about 6 months.   Take care.  Glad to see you.

## 2013-10-17 NOTE — Progress Notes (Signed)
Pre visit review using our clinic review tool, if applicable. No additional management support is needed unless otherwise documented below in the visit note.  Routine anticipatory guidance given to patient.  See health maintenance. Flu 2014 Shingles d/w pt.   PNA d/w pt.   Tetanus 2005 Colonoscopy not indicated.  Prostate cancer screening per uro Advance directive.  Wife designated if pt is incapacitated.  Cognitive function addressed- see scanned forms- and if abnormal then additional documentation follows.   Fall risk d/w pt.  Fall precautions d/w pt.  Had slipped getting out of the shower this AM.  Back is slightly sore.  Hasn't used ice yet.  No LOC.  He didn't really want to go to PT.  He isn't lightheaded.    Diabetes:  No meds Hypoglycemic episodes: no Hyperglycemic episodes:no Feet problems: not now.  He had vascular surgery intervention prev and his foot has healed.  Blood Sugars averaging: ~ 100 usually eye exam within last year:f/u pending, soon  Hypertension:    Using medication without problems or lightheadedness: yes Chest pain with exertion: see prev cards note.  Mild occ twinges in the chest Edema:no Short of breath: occ with exertion, likely age appropriate per patient  Elevated Cholesterol: Using medications without problems:yes Muscle aches: no Diet compliance:yes Exercise:yes  PMH and SH reviewed.   Vital signs, Meds and allergies reviewed.  ROS: See HPI.  Otherwise nontributory.   GEN: nad, alert and oriented HEENT: mucous membranes moist NECK: supple w/o LA CV: IRR PULM: ctab, no inc wob ABD: soft, +bs EXT: no edema SKIN: no acute rash  Diabetic foot exam: Normal inspection No skin breakdown Callus on R 1st toe and L distal 5th MT 1+ DP pulses Dec sensation to light tough and monofilament Nails normal

## 2013-10-18 NOTE — Assessment & Plan Note (Signed)
Controlled, continue current med.  Labs d/w pt.  

## 2013-10-18 NOTE — Assessment & Plan Note (Signed)
Not tachy today, continue warfarin for now.

## 2013-10-18 NOTE — Assessment & Plan Note (Signed)
Cr improved, continue as is.

## 2013-10-18 NOTE — Assessment & Plan Note (Signed)
Flu 2014 Shingles d/w pt.   PNA d/w pt.   Tetanus 2005 Colonoscopy not indicated.  Prostate cancer screening per uro Advance directive.  Wife designated if pt is incapacitated.  Cognitive function addressed- see scanned forms- and if abnormal then additional documentation follows.

## 2013-10-18 NOTE — Assessment & Plan Note (Signed)
Reasonable control, he has f/u with cards pending.

## 2013-10-18 NOTE — Assessment & Plan Note (Signed)
Foot exam improved, continue off DM2 meds.  Labs d/w pt.

## 2013-10-20 ENCOUNTER — Encounter: Payer: Self-pay | Admitting: Podiatry

## 2013-10-20 ENCOUNTER — Telehealth: Payer: Self-pay

## 2013-10-20 ENCOUNTER — Ambulatory Visit (INDEPENDENT_AMBULATORY_CARE_PROVIDER_SITE_OTHER): Payer: BLUE CROSS/BLUE SHIELD | Admitting: Podiatry

## 2013-10-20 VITALS — BP 178/91 | HR 53 | Resp 16 | Ht 65.0 in | Wt 182.0 lb

## 2013-10-20 DIAGNOSIS — Q828 Other specified congenital malformations of skin: Secondary | ICD-10-CM

## 2013-10-20 DIAGNOSIS — B351 Tinea unguium: Secondary | ICD-10-CM

## 2013-10-20 DIAGNOSIS — E1159 Type 2 diabetes mellitus with other circulatory complications: Secondary | ICD-10-CM

## 2013-10-20 DIAGNOSIS — M79609 Pain in unspecified limb: Secondary | ICD-10-CM

## 2013-10-20 NOTE — Telephone Encounter (Signed)
Relevant patient education mailed to patient.  

## 2013-10-20 NOTE — Progress Notes (Signed)
   Subjective:    Patient ID: Eric [name]., male    DOB: 10/24/1927, 80 y.o.   MRN: [id number]  HPI Comments: N sore L callus b/l under 5th digits D ? O ? C ? A ? T pt trims callus, dr regal trimmed callus      Review of Systems  Constitutional: Negative.   HENT:       Sinus problems Hearing loss  Eyes: Negative.   Respiratory: Negative.   Cardiovascular: Negative.   Gastrointestinal: Negative.   Endocrine: Negative.   Genitourinary: Negative.   Musculoskeletal:       Joint pain  Skin: Negative.   Allergic/Immunologic: Negative.   Neurological: Positive for numbness.  Hematological: Bruises/bleeds easily.  Psychiatric/Behavioral: Negative.        Objective:   Physical Exam        Assessment & Plan:

## 2013-10-20 NOTE — Progress Notes (Signed)
Subjective:     Patient ID: Eric [name]., male   DOB: 03-09-1928, 80 y.o.   MRN: [id number]  HPI patient presents stating he has problems with lesions under his feet his nails that he is a long-term diabetic who has vascular neurological issues and he has hammertoe deformity   Review of Systems  All other systems reviewed and are negative.       Objective:   Physical Exam  Nursing note and vitals reviewed. Constitutional: He is oriented to person, place, and time.  Musculoskeletal: Normal range of motion.  Neurological: He is oriented to person, place, and time.  Skin: Skin is dry.   patient has diminishment of vascular status with DP and PT pulses with coolness to his digits noted and diminished Fill time of both feet. Is found to have keratotic lesions sub-fifth metatarsal of both feet in someone of his left foot with pain and nail disease 1-5 both feet that are thick and incurvated in the corners. Range of motion and muscle strength is found to be diminished on both feet     Assessment:     At risk diabetic with keratotic Boro type lesions and nail disease that are painful 1-5 both feet and has prescription for diabetic shoes from family physician Dr. Damita Dunnings    Plan:     H&P and condition discussed. Debridement of nailbeds and debridement of lesions accomplished with no iatrogenic bleeding noted and he is casted for diabetic shoes with customized insole to reduce all plantar pressure on his feet

## 2013-10-30 ENCOUNTER — Encounter (HOSPITAL_COMMUNITY): Payer: Medicare Other

## 2013-10-30 ENCOUNTER — Encounter: Payer: Self-pay | Admitting: Cardiology

## 2013-10-31 ENCOUNTER — Ambulatory Visit (HOSPITAL_COMMUNITY): Payer: Medicare Other | Attending: Cardiology | Admitting: Radiology

## 2013-10-31 VITALS — BP 197/101 | HR 54 | Ht 65.5 in | Wt 181.0 lb

## 2013-10-31 DIAGNOSIS — I2510 Atherosclerotic heart disease of native coronary artery without angina pectoris: Secondary | ICD-10-CM

## 2013-10-31 DIAGNOSIS — I4891 Unspecified atrial fibrillation: Secondary | ICD-10-CM | POA: Insufficient documentation

## 2013-10-31 DIAGNOSIS — I739 Peripheral vascular disease, unspecified: Secondary | ICD-10-CM | POA: Insufficient documentation

## 2013-10-31 DIAGNOSIS — R079 Chest pain, unspecified: Secondary | ICD-10-CM

## 2013-10-31 MED ORDER — TECHNETIUM TC 99M SESTAMIBI GENERIC - CARDIOLITE
30.0000 | Freq: Once | INTRAVENOUS | Status: AC | PRN
  Administered 2013-10-31: 30 via INTRAVENOUS

## 2013-10-31 MED ORDER — REGADENOSON 0.4 MG/5ML IV SOLN
0.4000 mg | Freq: Once | INTRAVENOUS | Status: AC
  Administered 2013-10-31: 0.4 mg via INTRAVENOUS

## 2013-10-31 MED ORDER — TECHNETIUM TC 99M SESTAMIBI GENERIC - CARDIOLITE
10.0000 | Freq: Once | INTRAVENOUS | Status: AC | PRN
  Administered 2013-10-31: 10 via INTRAVENOUS

## 2013-10-31 NOTE — Progress Notes (Signed)
Eric [name], Eric [id number] [phone]    Cardiology Nuclear Med Study  Dhiren [name]. is a 80 y.o. male     MRN : [id number]     DOB: 1928/04/19  Procedure Date: 10/31/2013  Nuclear Med Background Indication for Stress Test:  Evaluation for Ischemia, Graft Patency and PTCA Patency History:  CAD, Cath, CABG, PTCA to LAD, Afib, Echo, MPI 2011 (normal) EF 62% Cardiac Risk Factors: Strong Family History - CAD, Hypertension, Lipids, NIDDM and PVD  Symptoms:  Chest Pain and DOE   Nuclear Pre-Procedure Caffeine/Decaff Intake:  None > 12 hrs NPO After: 10:00pm   Lungs:  clear O2 Sat: 96% on room air. IV 0.9% NS with Angio Cath:  22g  IV Site: R Antecubital x 1, tolerated well IV Started by:  Irven Baltimore, RN  Chest Size (in):  40 Cup Size: n/a  Height: 5' 5.5" (1.664 m)  Weight:  181 lb (82.101 kg)  BMI:  Body mass index is 29.65 kg/(m^2). Tech Comments:  Took lopressor this am. Fasting CBG was 108 at 0600 today. Irven Baltimore, RN.    Nuclear Med Study 1 or 2 day study: 1 day  Stress Test Type:  Carlton Adam  Reading MD: N/A  Order Authorizing Provider:  Kirk Ruths, MD  Resting Radionuclide: Technetium 33m Sestamibi  Resting Radionuclide Dose: 11.0 mCi   Stress Radionuclide:  Technetium 56m Sestamibi  Stress Radionuclide Dose: 33.0 mCi           Stress Protocol Rest HR: 54 Stress HR: 63  Rest BP: 197/101 Stress BP: 130/63  Exercise Time (min): n/a METS: n/a           Dose of Adenosine (mg):  n/a Dose of Lexiscan: 0.4 mg  Dose of Atropine (mg): n/a Dose of Dobutamine: n/a mcg/kg/min (at max HR)  Stress Test Technologist: Glade Lloyd, BS-ES  Nuclear Technologist:  Charlton Amor, CNMT     Rest Procedure:  Myocardial perfusion imaging was performed at rest 45 minutes following the intravenous administration of Technetium 8m Sestamibi. Rest ECG: Atrial Fibrilliation, no ST changes.  Stress Procedure:  The  patient received IV Lexiscan 0.4 mg over 15-seconds.  Technetium 59m Sestamibi injected at 30-seconds.  Quantitative spect images were obtained after a 45 minute delay.  During the infusion of Lexiscan, the patient complained of SOB.  This resolved in recovery.  Stress ECG: No significant change from baseline ECG  QPS Raw Data Images:  Mild diaphragmatic attenuation.  Normal left ventricular size. Stress Images:  There is subtle decreased uptake along mid anterior wall distrubution. Rest Images:  There is homogeneous radiotracer uptake.  Subtraction (SDS):  3. Small amount of ischemia can not be excluded.  Transient Ischemic Dilatation (Normal <1.22):  1.06 Lung/Heart Ratio (Normal <0.45):  0.35  Quantitative Gated Spect Images QGS EDV:  n/a ml QGS ESV:  n/a ml  Impression Exercise Capacity:  Lexiscan with no exercise. BP Response:  Normal blood pressure response. Clinical Symptoms:  There is dyspnea. Typical Lexiscan.  ECG Impression:  No significant ST segment change suggestive of ischemia. Comparison with Prior Nuclear Study: No images to compare  Overall Impression:  Low risk stress nuclear study with very small amount of ischemia which can not be excluded in the mid anterior wall distribution. .  LV Ejection Fraction: Study not gated.  LV Wall Motion:  NL LV Function; NL Wall Motion  If symptoms worsen or become more worrisome,  consider further cardiac testing.  Patient is status post coronary artery bypassing graft in 2000 with a LIMA to the LAD, saphenous vein graft to the diagonal and saphenous vein graft to the PDA. Cardiac catheterization in 2003 revealed normal left main, occluded LAD after the first diagonal, 80% LAD leading into the diagonal, 50% ramus, 50% obtuse marginal 70% mid PDA and an 80% right coronary artery. The saphenous vein graft to the right coronary and diagonal were occluded. The LIMA to the LAD was patent. LV function was normal. Patient had PCI of the LAD at  that time.   Candee Furbish, MD

## 2013-11-07 ENCOUNTER — Other Ambulatory Visit: Payer: Self-pay | Admitting: *Deleted

## 2013-11-07 LAB — HM DIABETES EYE EXAM

## 2013-11-07 MED ORDER — METOPROLOL TARTRATE 100 MG PO TABS: 100.0000 mg | ORAL_TABLET | Freq: Two times a day (BID) | ORAL | Status: DC

## 2013-11-17 ENCOUNTER — Telehealth: Payer: Self-pay | Admitting: *Deleted

## 2013-11-17 NOTE — Telephone Encounter (Signed)
CALLED AND L/M FOR PT TO CALL BACK AND SCHEDULE AN APPT TO PICK UP DIABETIC SHOES.

## 2013-11-20 ENCOUNTER — Encounter: Payer: Self-pay | Admitting: Family Medicine

## 2013-11-21 ENCOUNTER — Encounter: Payer: Self-pay | Admitting: Podiatry

## 2013-11-21 ENCOUNTER — Ambulatory Visit (INDEPENDENT_AMBULATORY_CARE_PROVIDER_SITE_OTHER): Payer: BLUE CROSS/BLUE SHIELD | Admitting: Podiatry

## 2013-11-21 VITALS — BP 167/86 | HR 86 | Resp 12

## 2013-11-21 DIAGNOSIS — E1159 Type 2 diabetes mellitus with other circulatory complications: Secondary | ICD-10-CM

## 2013-11-21 DIAGNOSIS — M2040 Other hammer toe(s) (acquired), unspecified foot: Secondary | ICD-10-CM

## 2013-11-21 NOTE — Progress Notes (Signed)
   Subjective:    Patient ID: Eric [name]., male    DOB: 1927/08/26, 80 y.o.   MRN: [id number]  HPI PT STATED THE DIABETIC SHOES ARE TOO BIG AND WANTS 7.5 INSTEAD OF 8.5.   Review of Systems     Objective:   Physical Exam        Assessment & Plan:

## 2013-11-22 NOTE — Progress Notes (Signed)
Subjective:     Patient ID: Eric [name]., male   DOB: 1927/08/30, 80 y.o.   MRN: [id number]  HPI presents to pick up diabetic shoes   Review of Systems     Objective:   Physical Exam Neurovascular status unchanged    Assessment:     At risk diabetic    Plan:     Shoes do not fit well and will need to be sent back for a smaller size

## 2013-11-23 ENCOUNTER — Ambulatory Visit (INDEPENDENT_AMBULATORY_CARE_PROVIDER_SITE_OTHER): Payer: Medicare Other | Admitting: Pharmacist Clinician (PhC)/ Clinical Pharmacy Specialist

## 2013-11-23 DIAGNOSIS — I4892 Unspecified atrial flutter: Secondary | ICD-10-CM

## 2013-11-23 DIAGNOSIS — I4891 Unspecified atrial fibrillation: Secondary | ICD-10-CM

## 2013-11-23 DIAGNOSIS — Z5181 Encounter for therapeutic drug level monitoring: Secondary | ICD-10-CM

## 2013-11-23 DIAGNOSIS — Z7901 Long term (current) use of anticoagulants: Secondary | ICD-10-CM

## 2013-11-23 LAB — POCT INR: INR: 3.8

## 2013-12-04 ENCOUNTER — Ambulatory Visit (INDEPENDENT_AMBULATORY_CARE_PROVIDER_SITE_OTHER): Payer: Medicare Other | Admitting: *Deleted

## 2013-12-04 ENCOUNTER — Ambulatory Visit (INDEPENDENT_AMBULATORY_CARE_PROVIDER_SITE_OTHER): Payer: Medicare Other | Admitting: Cardiology

## 2013-12-04 ENCOUNTER — Encounter: Payer: Self-pay | Admitting: Cardiology

## 2013-12-04 VITALS — BP 132/80 | HR 64 | Ht 65.0 in | Wt 187.1 lb

## 2013-12-04 DIAGNOSIS — I2510 Atherosclerotic heart disease of native coronary artery without angina pectoris: Secondary | ICD-10-CM

## 2013-12-04 DIAGNOSIS — I4891 Unspecified atrial fibrillation: Secondary | ICD-10-CM

## 2013-12-04 DIAGNOSIS — I4892 Unspecified atrial flutter: Secondary | ICD-10-CM

## 2013-12-04 DIAGNOSIS — I739 Peripheral vascular disease, unspecified: Secondary | ICD-10-CM

## 2013-12-04 DIAGNOSIS — Z5181 Encounter for therapeutic drug level monitoring: Secondary | ICD-10-CM

## 2013-12-04 DIAGNOSIS — I10 Essential (primary) hypertension: Secondary | ICD-10-CM

## 2013-12-04 DIAGNOSIS — E781 Pure hyperglyceridemia: Secondary | ICD-10-CM

## 2013-12-04 DIAGNOSIS — Z7901 Long term (current) use of anticoagulants: Secondary | ICD-10-CM

## 2013-12-04 LAB — POCT INR: INR: 3.1

## 2013-12-04 NOTE — Assessment & Plan Note (Signed)
Continue beta blocker and Coumadin. 

## 2013-12-04 NOTE — Progress Notes (Signed)
HPI: FU atrial fibrillation, coronary artery disease. Patient is status post coronary artery bypassing graft in 2000 with a LIMA to the LAD, saphenous vein graft to the diagonal and saphenous vein graft to the PDA. Cardiac catheterization in 2003 revealed normal left main, occluded LAD after the first diagonal, 80% LAD leading into the diagonal, 50% ramus, 50% obtuse marginal 70% mid PDA and an 80% right coronary artery. The saphenous vein graft to the right coronary and diagonal were occluded. The LIMA to the LAD was patent. LV function was normal. Patient had PCI of the LAD at that time. Patient also with peripheral vascular disease and has had prior angioplasty of his right lower extremity in [place] 2013. He also has permanent atrial fibrillation. Last nuclear study in March of 2015 was not gated. There was a small amount of ischemia in the mid anterior wall. Since he was last seen he has dyspnea with more extreme activities. Occasional brief chest pain for several minutes not related to activities. No associated symptoms. Occasional mild pedal edema.   Current Outpatient Prescriptions  Medication Sig Dispense Refill  . acetaminophen (TYLENOL) 500 MG tablet 2 tablets by mouth as needed pain       . atorvastatin (LIPITOR) 20 MG tablet Take 1 tablet (20 mg total) by mouth daily.  90 tablet  3  . B Complex Vitamins (B COMPLEX-B12) TABS Take 1 tablet by mouth. 1000 mcg daily       . cholecalciferol (VITAMIN D) 1000 UNITS tablet Take 1,000 Units by mouth 2 (two) times a week.        [name] CINNAMON PO Take by mouth 2 (two) times daily.       . fenofibrate 160 MG tablet Take 1 tablet (160 mg total) by mouth daily.  90 tablet  0  . fish oil-omega-3 fatty acids 1000 MG capsule Take 2 g by mouth 3 (three) times daily.       . furosemide (LASIX) 20 MG tablet Take 1 tablet (20 mg total) by mouth daily.  90 tablet  0  . lisinopril (PRINIVIL,ZESTRIL) 20 MG tablet Take 1 tablet (20 mg total) by mouth  daily.  90 tablet  0  . metoprolol (LOPRESSOR) 100 MG tablet Take 1 tablet (100 mg total) by mouth 2 (two) times daily.  180 tablet  01  . Multiple Vitamin (DAILY MULTIVITAMIN PO) Take 1 tablet by mouth daily.        [name] warfarin (COUMADIN) 5 MG tablet As directed       No current facility-administered medications for this visit.     Past Medical History  Diagnosis Date  . CAD (coronary artery disease)     cath 2003, occluded S-RCA, occluded S-Dx, L-LAD ok, s/p PTCA to LAD  . Atrial fibrillation   . Atrial flutter     s/p RFCA  . Long term (current) use of anticoagulants   . Pure hyperglyceridemia   . Unspecified essential hypertension   . Diabetes mellitus   . OSA (obstructive sleep apnea) 12/11    very mild, AHI 7/hr  . PVD (peripheral vascular disease)     angioplasty of his right lower extremity in [place] by Dr.[name] 2013    Past Surgical History  Procedure Laterality Date  . Nm myoview ltd  4/11    normal  . Angioplasty  1/99    CAD- diogonal with rotational artherectomy  . Carpal tunnel release      ? bilateral  .  Cardiac catheterization  1/00  . Arthrectomy      of LAD & PTCA  . Coronary artery bypass graft  2000  . Ptca  4/03    admit unstable angina  . Cardioversion  1/04  . Cardioversion  5/07    hospital- a flutter  . Adenosine myoview  3/06    EF 56%, neg. Ischemia  . Adenosine myoview  02/18/07    nml  . Hand surgery  08/27/09    R thumb procedure wit Scaphoid Gragt and screws, Dr [name]  . Colonoscopy w/ biopsies  10/01/06    sigmoid polyp bx neg, 3 years    History   Social History  . Marital Status: Married    Spouse Name: N/A    Number of Children: 72  . Years of Education: N/A   Occupational History  . [employer]    Social History Main Topics  . Smoking status: Never Smoker   . Smokeless tobacco: Former Systems developer  . Alcohol Use: No  . Drug Use: No  . Sexual Activity: Yes   Other Topics Concern  . Not on file   Social History  Narrative   Retired now does some antiques   Retired from Theatre manager and dye work   Married 1951   5 kids    ROS: no fevers or chills, productive cough, hemoptysis, dysphasia, odynophagia, melena, hematochezia, dysuria, hematuria, rash, seizure activity, orthopnea, PND, pedal edema, claudication. Remaining systems are negative.  Physical Exam: Well-developed well-nourished in no acute distress.  Skin is warm and dry.  HEENT is normal.  Neck is supple.  Chest is clear to auscultation with normal expansion.  Cardiovascular exam is regular rate and rhythm.  Abdominal exam nontender or distended. No masses palpated. Extremities show Trace edema. neuro grossly intact

## 2013-12-04 NOTE — Assessment & Plan Note (Signed)
Continue present medications. 

## 2013-12-04 NOTE — Assessment & Plan Note (Signed)
Continue statin. Not on aspirin given need for Coumadin. I reviewed his nuclear study with him today. There is possible mild anterior ischemia. However he is not particularly symptomatic and I do not consider this at high risk study. Plan medical therapy for now. If symptoms worsen we will consider cardiac catheterization.

## 2013-12-04 NOTE — Patient Instructions (Signed)
Your physician wants you to follow-up in: 6 MONTHS WITH DR CRENSHAW You will receive a reminder letter in the mail two months in advance. If you don't receive a letter, please call our office to schedule the follow-up appointment.  

## 2013-12-04 NOTE — Assessment & Plan Note (Signed)
Blood pressure controlled. Continue present medications. 

## 2013-12-04 NOTE — Assessment & Plan Note (Signed)
Continue statin. Not on aspirin given need for Coumadin. 

## 2013-12-05 ENCOUNTER — Encounter: Payer: Self-pay | Admitting: Podiatry

## 2013-12-05 ENCOUNTER — Ambulatory Visit (INDEPENDENT_AMBULATORY_CARE_PROVIDER_SITE_OTHER): Payer: BLUE CROSS/BLUE SHIELD | Admitting: Podiatry

## 2013-12-05 VITALS — BP 168/91 | HR 63 | Resp 18

## 2013-12-05 DIAGNOSIS — E1159 Type 2 diabetes mellitus with other circulatory complications: Secondary | ICD-10-CM

## 2013-12-05 DIAGNOSIS — M2040 Other hammer toe(s) (acquired), unspecified foot: Secondary | ICD-10-CM

## 2013-12-05 DIAGNOSIS — E1149 Type 2 diabetes mellitus with other diabetic neurological complication: Secondary | ICD-10-CM

## 2013-12-05 NOTE — Progress Notes (Signed)
Pick up diabetic shoes. 7.5 wide black shoe 510

## 2013-12-05 NOTE — Progress Notes (Signed)
Subjective:     Patient ID: Eric [name]., male   DOB: 05/19/1928, 80 y.o.   MRN: [id number]  HPI patient presents stating I'm here for my diabetic shoes because of my feet and my diabetes. I've had no changes   Review of Systems     Objective:   Physical Exam Neurovascular status unchanged with structural deformity and diminishment of circulatory status along with neurological status both feet with long-term diabetes    Assessment:     At risk diabetic with structural deformity    Plan:     Dispensed diabetic shoes with all instructions. They fit well and we gave him an instruction her how to break them in

## 2013-12-15 ENCOUNTER — Other Ambulatory Visit: Payer: Self-pay | Admitting: *Deleted

## 2013-12-15 NOTE — Telephone Encounter (Signed)
Patient faxed for refills on these medications to [facility].  Amlodipine was not on the current meds list.  I phoned the patient and he states he is taking one half tablet by mouth daily.  Please advise.

## 2013-12-17 MED ORDER — AMLODIPINE BESYLATE 5 MG PO TABS: ORAL_TABLET | ORAL | Status: DC

## 2013-12-17 MED ORDER — LISINOPRIL 20 MG PO TABS: 20.0000 mg | ORAL_TABLET | Freq: Every day | ORAL | Status: DC

## 2013-12-17 NOTE — Telephone Encounter (Signed)
Noted, thanks.  Sent.  

## 2013-12-18 ENCOUNTER — Ambulatory Visit (INDEPENDENT_AMBULATORY_CARE_PROVIDER_SITE_OTHER): Payer: Medicare Other | Admitting: *Deleted

## 2013-12-18 DIAGNOSIS — I4892 Unspecified atrial flutter: Secondary | ICD-10-CM

## 2013-12-18 DIAGNOSIS — Z5181 Encounter for therapeutic drug level monitoring: Secondary | ICD-10-CM

## 2013-12-18 DIAGNOSIS — I4891 Unspecified atrial fibrillation: Secondary | ICD-10-CM

## 2013-12-18 DIAGNOSIS — Z7901 Long term (current) use of anticoagulants: Secondary | ICD-10-CM

## 2013-12-18 LAB — POCT INR: INR: 3

## 2014-01-04 ENCOUNTER — Ambulatory Visit (INDEPENDENT_AMBULATORY_CARE_PROVIDER_SITE_OTHER): Payer: Medicare Other | Admitting: Pharmacist

## 2014-01-04 DIAGNOSIS — I4892 Unspecified atrial flutter: Secondary | ICD-10-CM

## 2014-01-04 DIAGNOSIS — Z7901 Long term (current) use of anticoagulants: Secondary | ICD-10-CM

## 2014-01-04 DIAGNOSIS — I4891 Unspecified atrial fibrillation: Secondary | ICD-10-CM

## 2014-01-04 DIAGNOSIS — Z5181 Encounter for therapeutic drug level monitoring: Secondary | ICD-10-CM

## 2014-01-04 LAB — POCT INR: INR: 3

## 2014-01-05 ENCOUNTER — Other Ambulatory Visit: Payer: Self-pay | Admitting: *Deleted

## 2014-01-05 MED ORDER — FUROSEMIDE 20 MG PO TABS: 20.0000 mg | ORAL_TABLET | Freq: Every day | ORAL | Status: DC

## 2014-01-05 MED ORDER — FENOFIBRATE 160 MG PO TABS: 160.0000 mg | ORAL_TABLET | Freq: Every day | ORAL | Status: DC

## 2014-02-01 ENCOUNTER — Ambulatory Visit (INDEPENDENT_AMBULATORY_CARE_PROVIDER_SITE_OTHER): Payer: Medicare Other | Admitting: Pharmacist Clinician (PhC)/ Clinical Pharmacy Specialist

## 2014-02-01 DIAGNOSIS — I4891 Unspecified atrial fibrillation: Secondary | ICD-10-CM

## 2014-02-01 DIAGNOSIS — I4892 Unspecified atrial flutter: Secondary | ICD-10-CM

## 2014-02-01 DIAGNOSIS — Z5181 Encounter for therapeutic drug level monitoring: Secondary | ICD-10-CM

## 2014-02-01 DIAGNOSIS — Z7901 Long term (current) use of anticoagulants: Secondary | ICD-10-CM

## 2014-02-01 LAB — POCT INR: INR: 3.5

## 2014-02-15 ENCOUNTER — Ambulatory Visit (INDEPENDENT_AMBULATORY_CARE_PROVIDER_SITE_OTHER): Payer: Medicare Other

## 2014-02-15 DIAGNOSIS — Z7901 Long term (current) use of anticoagulants: Secondary | ICD-10-CM

## 2014-02-15 DIAGNOSIS — I4891 Unspecified atrial fibrillation: Secondary | ICD-10-CM

## 2014-02-15 DIAGNOSIS — Z5181 Encounter for therapeutic drug level monitoring: Secondary | ICD-10-CM

## 2014-02-15 DIAGNOSIS — I4892 Unspecified atrial flutter: Secondary | ICD-10-CM

## 2014-02-15 LAB — POCT INR: INR: 2.4

## 2014-03-08 ENCOUNTER — Ambulatory Visit (INDEPENDENT_AMBULATORY_CARE_PROVIDER_SITE_OTHER): Payer: Medicare Other | Admitting: *Deleted

## 2014-03-08 DIAGNOSIS — Z7901 Long term (current) use of anticoagulants: Secondary | ICD-10-CM

## 2014-03-08 DIAGNOSIS — I4892 Unspecified atrial flutter: Secondary | ICD-10-CM

## 2014-03-08 DIAGNOSIS — I4891 Unspecified atrial fibrillation: Secondary | ICD-10-CM

## 2014-03-08 DIAGNOSIS — Z5181 Encounter for therapeutic drug level monitoring: Secondary | ICD-10-CM

## 2014-03-08 LAB — POCT INR: INR: 2.4

## 2014-04-06 ENCOUNTER — Ambulatory Visit (INDEPENDENT_AMBULATORY_CARE_PROVIDER_SITE_OTHER): Payer: Medicare Other | Admitting: *Deleted

## 2014-04-06 DIAGNOSIS — I4891 Unspecified atrial fibrillation: Secondary | ICD-10-CM

## 2014-04-06 DIAGNOSIS — Z7901 Long term (current) use of anticoagulants: Secondary | ICD-10-CM

## 2014-04-06 DIAGNOSIS — Z5181 Encounter for therapeutic drug level monitoring: Secondary | ICD-10-CM

## 2014-04-06 DIAGNOSIS — I4892 Unspecified atrial flutter: Secondary | ICD-10-CM

## 2014-04-06 LAB — POCT INR: INR: 3.1

## 2014-04-08 ENCOUNTER — Other Ambulatory Visit: Payer: Self-pay | Admitting: Family Medicine

## 2014-04-08 DIAGNOSIS — E1140 Type 2 diabetes mellitus with diabetic neuropathy, unspecified: Secondary | ICD-10-CM

## 2014-04-13 ENCOUNTER — Other Ambulatory Visit (INDEPENDENT_AMBULATORY_CARE_PROVIDER_SITE_OTHER): Payer: Medicare Other

## 2014-04-13 ENCOUNTER — Telehealth: Payer: Self-pay | Admitting: Family Medicine

## 2014-04-13 DIAGNOSIS — E1142 Type 2 diabetes mellitus with diabetic polyneuropathy: Secondary | ICD-10-CM

## 2014-04-13 DIAGNOSIS — E1149 Type 2 diabetes mellitus with other diabetic neurological complication: Secondary | ICD-10-CM

## 2014-04-13 DIAGNOSIS — E1140 Type 2 diabetes mellitus with diabetic neuropathy, unspecified: Secondary | ICD-10-CM

## 2014-04-13 LAB — HEMOGLOBIN A1C: Hgb A1c MFr Bld: 6.9 % — ABNORMAL HIGH (ref 4.6–6.5)

## 2014-04-13 NOTE — Telephone Encounter (Signed)
Pt left walk in form requesting enough Warfarin to be send to [facility] to get him through until he can get his normal refill from [facility].  He also needs refills sent to [facility].  Managed at [facility].

## 2014-04-17 ENCOUNTER — Other Ambulatory Visit: Payer: Self-pay | Admitting: *Deleted

## 2014-04-17 MED ORDER — WARFARIN SODIUM 5 MG PO TABS: 5.0000 mg | ORAL_TABLET | ORAL | Status: DC

## 2014-04-20 ENCOUNTER — Encounter: Payer: Self-pay | Admitting: Family Medicine

## 2014-04-20 ENCOUNTER — Ambulatory Visit (INDEPENDENT_AMBULATORY_CARE_PROVIDER_SITE_OTHER): Payer: Medicare Other | Admitting: Family Medicine

## 2014-04-20 VITALS — BP 124/72 | HR 55 | Temp 98.0°F | Wt 188.5 lb

## 2014-04-20 DIAGNOSIS — E1151 Type 2 diabetes mellitus with diabetic peripheral angiopathy without gangrene: Secondary | ICD-10-CM

## 2014-04-20 DIAGNOSIS — I10 Essential (primary) hypertension: Secondary | ICD-10-CM

## 2014-04-20 DIAGNOSIS — I798 Other disorders of arteries, arterioles and capillaries in diseases classified elsewhere: Secondary | ICD-10-CM

## 2014-04-20 DIAGNOSIS — M7020 Olecranon bursitis, unspecified elbow: Secondary | ICD-10-CM

## 2014-04-20 DIAGNOSIS — E1159 Type 2 diabetes mellitus with other circulatory complications: Secondary | ICD-10-CM

## 2014-04-20 DIAGNOSIS — Z23 Encounter for immunization: Secondary | ICD-10-CM

## 2014-04-20 NOTE — Progress Notes (Signed)
Pre visit review using our clinic review tool, if applicable. No additional management support is needed unless otherwise documented below in the visit note.  HTN/AF.  With exertion he has noted more SOB, not CP.  He thinks his exercise tolerance is decreased.  His pacing is slower.  These are gradual changes.  No BLE edema.  Compliant with meds.    Diabetes:  No rx meds Hypoglycemic episodes: no Hyperglycemic episodes:no Feet problems:no Blood Sugars averaging: ~100, 95 this AM eye exam within last year:yes A1c unchanged at 6.9.  D/w pt.   Flu shot today.    L olecranon bursitis, present for about 6-8 weeks.  Not sore.  Puffy.   Not red.  New problem, not prev evaluated.   PMH and SH reviewed  ROS: See HPI, otherwise noncontributory.  Meds, vitals, and allergies reviewed.   GEN: nad, alert and oriented HEENT: mucous membranes moist NECK: supple w/o LA CV: IRR, mildly brady on exam.   PULM: ctab, no inc wob ABD: soft, +bs EXT: no edema SKIN: no acute rash L olecranon puffy but not red or tender.  Not bruised.  Normal elbow ROM.  Distally nv intact.   Diabetic foot exam: Normal inspection No skin breakdown Calluses noted dec DP pulses B Dec sensation to light touch and monofilament Nails thickened

## 2014-04-20 NOTE — Patient Instructions (Signed)
Call cardiology about a follow up appointment if you don't hear from them first.  Don't change your meds for now.  Recheck at a physical in 6 months.  Labs ahead of time.  Glad to see you.  Take care.

## 2014-04-23 ENCOUNTER — Encounter: Payer: Self-pay | Admitting: Family Medicine

## 2014-04-23 DIAGNOSIS — M7020 Olecranon bursitis, unspecified elbow: Secondary | ICD-10-CM | POA: Insufficient documentation

## 2014-04-23 NOTE — Assessment & Plan Note (Signed)
Would not intervene given the risk of infection/bleeding with aspiration, the current lack of pain.  D/w pt.  He agree.

## 2014-04-23 NOTE — Assessment & Plan Note (Signed)
Controlled, but with dec in exercise tolerance.  Will d/w cards.  I didn't want to dec his BB in the meantime, before he saw cards.  He doesn't have chest pain and he appears euvolemic.

## 2014-04-23 NOTE — Assessment & Plan Note (Signed)
Controlled, no DM2 rx meds, continue as is with diet.  Labs d/w pt.

## 2014-04-27 ENCOUNTER — Ambulatory Visit (INDEPENDENT_AMBULATORY_CARE_PROVIDER_SITE_OTHER): Payer: Medicare Other | Admitting: Pharmacist

## 2014-04-27 DIAGNOSIS — Z5181 Encounter for therapeutic drug level monitoring: Secondary | ICD-10-CM

## 2014-04-27 DIAGNOSIS — I4891 Unspecified atrial fibrillation: Secondary | ICD-10-CM

## 2014-04-27 DIAGNOSIS — I4892 Unspecified atrial flutter: Secondary | ICD-10-CM

## 2014-04-27 DIAGNOSIS — Z7901 Long term (current) use of anticoagulants: Secondary | ICD-10-CM

## 2014-04-27 LAB — POCT INR: INR: 2.7

## 2014-05-07 ENCOUNTER — Ambulatory Visit (INDEPENDENT_AMBULATORY_CARE_PROVIDER_SITE_OTHER): Payer: Medicare Other | Admitting: Cardiology

## 2014-05-07 ENCOUNTER — Encounter: Payer: Self-pay | Admitting: Cardiology

## 2014-05-07 VITALS — BP 140/70 | HR 50 | Ht 65.5 in | Wt 187.9 lb

## 2014-05-07 DIAGNOSIS — I739 Peripheral vascular disease, unspecified: Secondary | ICD-10-CM

## 2014-05-07 DIAGNOSIS — I4891 Unspecified atrial fibrillation: Secondary | ICD-10-CM

## 2014-05-07 DIAGNOSIS — I10 Essential (primary) hypertension: Secondary | ICD-10-CM

## 2014-05-07 DIAGNOSIS — I2510 Atherosclerotic heart disease of native coronary artery without angina pectoris: Secondary | ICD-10-CM

## 2014-05-07 DIAGNOSIS — I482 Chronic atrial fibrillation, unspecified: Secondary | ICD-10-CM

## 2014-05-07 MED ORDER — AMLODIPINE BESYLATE 5 MG PO TABS: 5.0000 mg | ORAL_TABLET | Freq: Every day | ORAL | Status: DC

## 2014-05-07 MED ORDER — METOPROLOL TARTRATE 50 MG PO TABS: 75.0000 mg | ORAL_TABLET | Freq: Two times a day (BID) | ORAL | Status: DC

## 2014-05-07 NOTE — Progress Notes (Signed)
HPI: FU atrial fibrillation, coronary artery disease. Patient is status post coronary artery bypassing graft in 2000 with a LIMA to the LAD, saphenous vein graft to the diagonal and saphenous vein graft to the PDA. Cardiac catheterization in 2003 revealed normal left main, occluded LAD after the first diagonal, 80% LAD leading into the diagonal, 50% ramus, 50% obtuse marginal 70% mid PDA and an 80% right coronary artery. The saphenous vein graft to the right coronary and diagonal were occluded. The LIMA to the LAD was patent. LV function was normal. Patient had PCI of the LAD at that time. Patient also with peripheral vascular disease and has had prior angioplasty of his right lower extremity in [place] 2013. He also has permanent atrial fibrillation. Last nuclear study in March of 2015 was not gated. There was a small amount of ischemia in the mid anterior wall. Since he was last seen He noted some increased dyspnea on exertion. No orthopnea, PND or pedal edema. No chest pain.   Current Outpatient Prescriptions  Medication Sig Dispense Refill  . acetaminophen (TYLENOL) 500 MG tablet 2 tablets by mouth as needed pain       . amLODipine (NORVASC) 5 MG tablet Take one half (2.5 mg.) tablet by mouth daily.  45 tablet  3  . atorvastatin (LIPITOR) 20 MG tablet Take 1 tablet (20 mg total) by mouth daily.  90 tablet  3  . B Complex Vitamins (B COMPLEX-B12) TABS Take 1 tablet by mouth. 1000 mcg daily       . cholecalciferol (VITAMIN D) 1000 UNITS tablet Take 1,000 Units by mouth 2 (two) times a week.        [name] CINNAMON PO Take by mouth 2 (two) times daily.       . fenofibrate 160 MG tablet Take 1 tablet (160 mg total) by mouth daily.  90 tablet  3  . fish oil-omega-3 fatty acids 1000 MG capsule Take 2 g by mouth 3 (three) times daily.       . furosemide (LASIX) 20 MG tablet Take 1 tablet (20 mg total) by mouth daily.  90 tablet  3  . lisinopril (PRINIVIL,ZESTRIL) 20 MG tablet Take 1 tablet (20 mg  total) by mouth daily.  90 tablet  3  . metoprolol (LOPRESSOR) 100 MG tablet Take 1 tablet (100 mg total) by mouth 2 (two) times daily.  180 tablet  01  . Multiple Vitamin (DAILY MULTIVITAMIN PO) Take 1 tablet by mouth daily.        [name] warfarin (COUMADIN) 5 MG tablet Take 1 tablet (5 mg total) by mouth as directed. As directed  30 tablet  1   No current facility-administered medications for this visit.     Past Medical History  Diagnosis Date  . CAD (coronary artery disease)     cath 2003, occluded S-RCA, occluded S-Dx, L-LAD ok, s/p PTCA to LAD  . Atrial fibrillation   . Atrial flutter     s/p RFCA  . Long term (current) use of anticoagulants   . Pure hyperglyceridemia   . Unspecified essential hypertension   . Diabetes mellitus   . OSA (obstructive sleep apnea) 12/11    very mild, AHI 7/hr  . PVD (peripheral vascular disease)     angioplasty of his right lower extremity in [place] by Dr.[name] 2013    Past Surgical History  Procedure Laterality Date  . Nm myoview ltd  4/11    normal  . Angioplasty  1/99    CAD- diogonal with rotational artherectomy  . Carpal tunnel release      ? bilateral  . Cardiac catheterization  1/00  . Arthrectomy      of LAD & PTCA  . Coronary artery bypass graft  2000  . Ptca  4/03    admit unstable angina  . Cardioversion  1/04  . Cardioversion  5/07    hospital- a flutter  . Adenosine myoview  3/06    EF 56%, neg. Ischemia  . Adenosine myoview  02/18/07    nml  . Hand surgery  08/27/09    R thumb procedure wit Scaphoid Gragt and screws, Dr [name]  . Colonoscopy w/ biopsies  10/01/06    sigmoid polyp bx neg, 3 years    History   Social History  . Marital Status: Married    Spouse Name: N/A    Number of Children: 23  . Years of Education: N/A   Occupational History  . [employer]    Social History Main Topics  . Smoking status: Never Smoker   . Smokeless tobacco: Former Systems developer  . Alcohol Use: No  . Drug Use: No  . Sexual  Activity: Yes   Other Topics Concern  . Not on file   Social History Narrative   Retired now does some antiques   Retired from Theatre manager and dye work   Married 1951   5 kids    ROS: no fevers or chills, productive cough, hemoptysis, dysphasia, odynophagia, melena, hematochezia, dysuria, hematuria, rash, seizure activity, orthopnea, PND, pedal edema, claudication. Remaining systems are negative.  Physical Exam: Well-developed well-nourished in no acute distress.  Skin is warm and dry.  HEENT is normal.  Neck is supple.  Chest is clear to auscultation with normal expansion.  Cardiovascular exam is bradycardic and irregular Abdominal exam nontender or distended. No masses palpated. Extremities show no edema. neuro grossly intact  ECG Atrial fibrillation at a rate of 50. Nonspecific T-wave changes.

## 2014-05-07 NOTE — Assessment & Plan Note (Signed)
Continue statin. Not on aspirin given need for anticoagulation. 

## 2014-05-07 NOTE — Assessment & Plan Note (Signed)
Continue statin. 

## 2014-05-07 NOTE — Patient Instructions (Signed)
Your physician wants you to follow-up in: Eric [name] will receive a reminder letter in the mail two months in advance. If you don't receive a letter, please call our office to schedule the follow-up appointment.   DECREASE METOPROLOL TO 75 MG TWICE DAILY  INCREASE AMLODIPINE TO 5 MG ONCE DAILY

## 2014-05-07 NOTE — Assessment & Plan Note (Signed)
Continue present medications. 

## 2014-05-07 NOTE — Assessment & Plan Note (Signed)
Decrease metoprolol to 75 mg by mouth twice a day secondary to bradycardia. Increase amlodipine to 5 mg daily. Follow blood pressure and adjust regimen as needed.

## 2014-05-07 NOTE — Assessment & Plan Note (Signed)
Patient remains in permanent atrial fibrillation. Heart rate borderline. Decrease metoprolol to 75 mg by mouth twice a day. Continue Coumadin.

## 2014-05-08 ENCOUNTER — Ambulatory Visit: Payer: Medicare Other | Admitting: Cardiology

## 2014-05-24 ENCOUNTER — Telehealth: Payer: Self-pay

## 2014-05-24 NOTE — Telephone Encounter (Signed)
Pt sent msg via fax--stating that he has a "lump" on his elbow, and you stated at his last OV that it was a bag of fluid and it was best to let it come down on it's own. Pt states the lump has become larger and he is in a lot of pain. Pt wants to know if he should have an OV to be reevaluated or if you can send something in for her pain--please advise

## 2014-05-24 NOTE — Telephone Encounter (Signed)
See if he can get in tomorrow for [place] to recheck it.  If red, hot, febrile in the meantime, then to ER.  Thanks.

## 2014-05-24 NOTE — Telephone Encounter (Signed)
Spoke to pt and he is aware--pt made an appt for tomorrow at 10:30 to see you

## 2014-05-25 ENCOUNTER — Ambulatory Visit (INDEPENDENT_AMBULATORY_CARE_PROVIDER_SITE_OTHER): Payer: Medicare Other | Admitting: *Deleted

## 2014-05-25 ENCOUNTER — Encounter: Payer: Self-pay | Admitting: Family Medicine

## 2014-05-25 ENCOUNTER — Ambulatory Visit (INDEPENDENT_AMBULATORY_CARE_PROVIDER_SITE_OTHER): Payer: Medicare Other | Admitting: Family Medicine

## 2014-05-25 VITALS — BP 144/74 | HR 60 | Temp 97.6°F | Wt 186.2 lb

## 2014-05-25 DIAGNOSIS — I4892 Unspecified atrial flutter: Secondary | ICD-10-CM

## 2014-05-25 DIAGNOSIS — M7020 Olecranon bursitis, unspecified elbow: Secondary | ICD-10-CM

## 2014-05-25 DIAGNOSIS — I4891 Unspecified atrial fibrillation: Secondary | ICD-10-CM

## 2014-05-25 DIAGNOSIS — Z5181 Encounter for therapeutic drug level monitoring: Secondary | ICD-10-CM

## 2014-05-25 DIAGNOSIS — Z7901 Long term (current) use of anticoagulants: Secondary | ICD-10-CM

## 2014-05-25 LAB — POCT INR: INR: 7.1

## 2014-05-25 LAB — PROTIME-INR
INR: 7.6 ratio (ref 0.8–1.0)
PROTHROMBIN TIME: 79.4 s — AB (ref 9.6–13.1)

## 2014-05-25 MED ORDER — TRAMADOL HCL 50 MG PO TABS: 50.0000 mg | ORAL_TABLET | Freq: Two times a day (BID) | ORAL | Status: DC | PRN

## 2014-05-25 NOTE — Patient Instructions (Addendum)
When your INR is below 3, I can work on this.  Take tramadol for pain if needed.  If the elbow if red or suddenly very painful (or if you have a fever), then go to the ER.  Call back to schedule an appointment when your INR is below 3.  Take care.  No charge for visit.

## 2014-05-25 NOTE — Telephone Encounter (Signed)
Thanks

## 2014-05-25 NOTE — Progress Notes (Signed)
Pre visit review using our clinic review tool, if applicable. No additional management support is needed unless otherwise documented below in the visit note.  L olecranon bursitis.  Puffier recently.  Was more painful a few days ago, less so now.  No fevers.  Not draining.  Minimally sore to the touch, but as expected.   INR was 7 today.  D/w pt.  I didn't know about that until the OV.  He has some bruising on the B medial thighs.  Meds, vitals, and allergies reviewed.   ROS: See HPI.  Otherwise, noncontributory.  nad L olecranon bursitis minimally ttp, puffy but not red.  bruising on the B medial thighs noted.

## 2014-05-27 NOTE — Assessment & Plan Note (Signed)
Doesn't look infected now. Doesn't need emergent ortho eval.   I can't/shouldn't drain this with INR of 7.   No intervention now.  He'll notify if he wants aspiration after INR back to usual range.  If any emergent sx in the meantime, to ER.  Can use tramadol for discomfort in the meantime but if severe pain, then to ER.  He agrees.  No charge since no other intervention today.

## 2014-05-29 ENCOUNTER — Telehealth: Payer: Self-pay | Admitting: *Deleted

## 2014-05-29 ENCOUNTER — Ambulatory Visit (INDEPENDENT_AMBULATORY_CARE_PROVIDER_SITE_OTHER): Payer: Medicare Other | Admitting: *Deleted

## 2014-05-29 ENCOUNTER — Telehealth: Payer: Self-pay | Admitting: Family Medicine

## 2014-05-29 DIAGNOSIS — I4892 Unspecified atrial flutter: Secondary | ICD-10-CM

## 2014-05-29 DIAGNOSIS — Z7901 Long term (current) use of anticoagulants: Secondary | ICD-10-CM

## 2014-05-29 DIAGNOSIS — Z5181 Encounter for therapeutic drug level monitoring: Secondary | ICD-10-CM

## 2014-05-29 DIAGNOSIS — I4891 Unspecified atrial fibrillation: Secondary | ICD-10-CM

## 2014-05-29 LAB — POCT INR: INR: 1.7

## 2014-05-29 MED ORDER — WARFARIN SODIUM 2 MG PO TABS: ORAL_TABLET | ORAL | Status: DC

## 2014-05-29 NOTE — Telephone Encounter (Signed)
Spoke with patient and advised results appt made for 05/31/14 @ 3:15

## 2014-05-29 NOTE — Telephone Encounter (Signed)
Message copied by Margretta Sidle on Tue May 29, 2014  2:14 PM ------      Message from: Tonia Ghent      Created: Tue May 29, 2014 12:52 PM       We'll be in contact with patient.  Thanks.       Brigitte Pulse            ----- Message -----         From: Margretta Sidle, RN         Sent: 05/29/2014  11:44 AM           To: Tonia Ghent, MD            Dr Damita Dunnings saw pt in coumadin clinic today and his INR was 1.7. Still has edema and redness noted left elbow and pt states is not as painful but does continue to have stinging sensation. Did not know if you wanted to see him since INR is 1.7 today. Did adjust his coumadin dose and will recheck INR in 1 week       Thanks      Elbert Ewings RN       ------

## 2014-05-29 NOTE — Telephone Encounter (Signed)
Please call pt.  INR is now lower.  I can try to drain his bursa on his L elbow this week.  If he wants it done, then please set him up for later this week.  Thanks.

## 2014-05-29 NOTE — Telephone Encounter (Signed)
Message copied by Margretta Sidle on Tue May 29, 2014  1:31 PM ------      Message from: Tonia Ghent      Created: Tue May 29, 2014 12:52 PM       We'll be in contact with patient.  Thanks.       Brigitte Pulse            ----- Message -----         From: Margretta Sidle, RN         Sent: 05/29/2014  11:44 AM           To: Tonia Ghent, MD            Dr Damita Dunnings saw pt in coumadin clinic today and his INR was 1.7. Still has edema and redness noted left elbow and pt states is not as painful but does continue to have stinging sensation. Did not know if you wanted to see him since INR is 1.7 today. Did adjust his coumadin dose and will recheck INR in 1 week       Thanks      Elbert Ewings RN       ------

## 2014-05-29 NOTE — Telephone Encounter (Signed)
Spoke with pt's wife and informed her that had received message back from Dr Damita Dunnings that they will contact him regarding seeing him and she states understanding.

## 2014-05-30 ENCOUNTER — Telehealth: Payer: Self-pay

## 2014-05-30 ENCOUNTER — Telehealth: Payer: Self-pay | Admitting: Cardiology

## 2014-05-30 NOTE — Telephone Encounter (Signed)
Returned call to pt, pt states he did not take his dosage of Coumadin yesterday.  States pharmacy did not have new tablet size ordered and he was told it would be ready for pick up today.  Pt has appt with Dr Damita Dunnings tomorrow to drain elbow.  Advised pt to resume Coumadin today, INR was sub therapeutic yesterday at 1.7 and will continue to drop even lower since he missed yesterday's dosage of Coumadin.  Advised pt the dosage of Coumadin he takes today will not change his INR for 2-3 days so he should definitely pick up new rx of Warfarin and resume taking at directed at [place] on 05/29/14.  Pt verbalized understanding.

## 2014-05-30 NOTE — Telephone Encounter (Signed)
Pt is scheduled for elbow surgery on 05/31/14 and wants to know if needs to adjust dosage on blood thinner. Pt states Dr Jacalyn Lefevre office takes care of his blood thinner; transferred pts call to [facility] cardiology [phone].

## 2014-05-30 NOTE — Telephone Encounter (Signed)
New message           Pt wants to know if he should start his coumadin medication today

## 2014-05-31 ENCOUNTER — Ambulatory Visit (INDEPENDENT_AMBULATORY_CARE_PROVIDER_SITE_OTHER): Payer: Medicare Other | Admitting: Family Medicine

## 2014-05-31 ENCOUNTER — Encounter: Payer: Self-pay | Admitting: Family Medicine

## 2014-05-31 ENCOUNTER — Ambulatory Visit: Payer: Medicare Other | Admitting: Cardiology

## 2014-05-31 ENCOUNTER — Encounter (INDEPENDENT_AMBULATORY_CARE_PROVIDER_SITE_OTHER): Payer: Self-pay

## 2014-05-31 VITALS — BP 120/64 | HR 71 | Temp 97.6°F | Wt 186.0 lb

## 2014-05-31 DIAGNOSIS — M7020 Olecranon bursitis, unspecified elbow: Secondary | ICD-10-CM

## 2014-05-31 MED ORDER — CEPHALEXIN 500 MG PO CAPS: 500.0000 mg | ORAL_CAPSULE | Freq: Three times a day (TID) | ORAL | Status: DC

## 2014-05-31 NOTE — Patient Instructions (Signed)
Keep the dressing on for about 5 days.  If it gets soiled or wet, then replace the wrapping.  Keep it clean and covered after that.  If you have any bleeding, then put direct pressure on the area and keep it covered.  Start the antibiotics today.  If you have any fever or spreading redness, then go to the ER.  Take care.  Glad to see you.

## 2014-05-31 NOTE — Progress Notes (Signed)
Pre visit review using our clinic review tool, if applicable. No additional management support is needed unless otherwise documented below in the visit note.  Bursa aspiration.   Meds, vitals, and allergies reviewed.   Indication: pain and swelling, but no sign of infection.   Pt complaints of: pain, swelling  Location: L olecranon  Informed consent obtained.  Pt aware of risks not limited to but including infection, bleeding, damage to near by organs.  He is aware of risk of re-accumulation and need for intervention if it becomes infected.   Prep: etoh/betadine  Anesthesia: ethyl chloride  18 needle introduced easily and w/o pain.  ~10 cc of thick dark blood aspirated but the lesion couldn't be fully decompressed.    Tolerated well

## 2014-06-01 ENCOUNTER — Telehealth: Payer: Self-pay | Admitting: *Deleted

## 2014-06-01 NOTE — Telephone Encounter (Signed)
Message copied by Margretta Sidle on Fri Jun 01, 2014 10:19 AM ------      Message from: Tonia Ghent      Created: Fri Jun 01, 2014  6:03 AM       FYI.  Started on keflex. I didn't adjust his coumadin.  Please see office note. Thanks.       Brigitte Pulse       ------

## 2014-06-01 NOTE — Telephone Encounter (Signed)
Spoke with pt and instructed that there is no interaction between Keflex and Coumadin and he states understanding

## 2014-06-01 NOTE — Assessment & Plan Note (Signed)
Could not be fully decompressed. Partial aspiration. D/w pt.  Likely with blood in the bursa that has coagulated.   His INR wasn't elevated on last check.  See prev notes. At this point, covered with compression dressing.  Leave on for a few days.  If any sign/sx of infection, to ER or notify us.  Start keflex in the meantime to proph tx.  He'll f/u with the coumadin clinic.   All done with sterile technique, no complications.

## 2014-06-06 ENCOUNTER — Ambulatory Visit (INDEPENDENT_AMBULATORY_CARE_PROVIDER_SITE_OTHER): Payer: Medicare Other | Admitting: *Deleted

## 2014-06-06 DIAGNOSIS — I4892 Unspecified atrial flutter: Secondary | ICD-10-CM

## 2014-06-06 DIAGNOSIS — I4891 Unspecified atrial fibrillation: Secondary | ICD-10-CM

## 2014-06-06 DIAGNOSIS — Z5181 Encounter for therapeutic drug level monitoring: Secondary | ICD-10-CM

## 2014-06-06 DIAGNOSIS — Z7901 Long term (current) use of anticoagulants: Secondary | ICD-10-CM

## 2014-06-06 LAB — POCT INR: INR: 2.1

## 2014-06-13 ENCOUNTER — Encounter: Payer: Self-pay | Admitting: *Deleted

## 2014-06-20 ENCOUNTER — Ambulatory Visit (INDEPENDENT_AMBULATORY_CARE_PROVIDER_SITE_OTHER): Payer: Medicare Other | Admitting: *Deleted

## 2014-06-20 DIAGNOSIS — I4891 Unspecified atrial fibrillation: Secondary | ICD-10-CM

## 2014-06-20 DIAGNOSIS — I4892 Unspecified atrial flutter: Secondary | ICD-10-CM

## 2014-06-20 DIAGNOSIS — Z7901 Long term (current) use of anticoagulants: Secondary | ICD-10-CM

## 2014-06-20 DIAGNOSIS — Z5181 Encounter for therapeutic drug level monitoring: Secondary | ICD-10-CM

## 2014-06-20 LAB — POCT INR: INR: 1.6

## 2014-06-27 ENCOUNTER — Ambulatory Visit (INDEPENDENT_AMBULATORY_CARE_PROVIDER_SITE_OTHER): Payer: Medicare Other | Admitting: *Deleted

## 2014-06-27 DIAGNOSIS — Z5181 Encounter for therapeutic drug level monitoring: Secondary | ICD-10-CM

## 2014-06-27 DIAGNOSIS — Z7901 Long term (current) use of anticoagulants: Secondary | ICD-10-CM

## 2014-06-27 DIAGNOSIS — I4891 Unspecified atrial fibrillation: Secondary | ICD-10-CM

## 2014-06-27 DIAGNOSIS — I4892 Unspecified atrial flutter: Secondary | ICD-10-CM

## 2014-06-27 LAB — POCT INR: INR: 2

## 2014-07-02 ENCOUNTER — Other Ambulatory Visit: Payer: Self-pay

## 2014-07-02 ENCOUNTER — Other Ambulatory Visit: Payer: Self-pay | Admitting: Cardiology

## 2014-07-02 MED ORDER — WARFARIN SODIUM 2 MG PO TABS: ORAL_TABLET | ORAL | Status: DC

## 2014-07-11 ENCOUNTER — Ambulatory Visit (INDEPENDENT_AMBULATORY_CARE_PROVIDER_SITE_OTHER): Payer: Medicare Other | Admitting: *Deleted

## 2014-07-11 DIAGNOSIS — Z7901 Long term (current) use of anticoagulants: Secondary | ICD-10-CM

## 2014-07-11 DIAGNOSIS — I4892 Unspecified atrial flutter: Secondary | ICD-10-CM

## 2014-07-11 DIAGNOSIS — I4891 Unspecified atrial fibrillation: Secondary | ICD-10-CM

## 2014-07-11 DIAGNOSIS — Z5181 Encounter for therapeutic drug level monitoring: Secondary | ICD-10-CM

## 2014-07-11 LAB — POCT INR: INR: 1.7

## 2014-07-17 ENCOUNTER — Encounter: Payer: Self-pay | Admitting: Family Medicine

## 2014-07-17 ENCOUNTER — Ambulatory Visit (INDEPENDENT_AMBULATORY_CARE_PROVIDER_SITE_OTHER): Payer: BLUE CROSS/BLUE SHIELD | Admitting: Family Medicine

## 2014-07-17 VITALS — BP 128/80 | HR 60 | Temp 97.5°F | Resp 20 | Ht 65.0 in | Wt 185.0 lb

## 2014-07-17 DIAGNOSIS — E781 Pure hyperglyceridemia: Secondary | ICD-10-CM

## 2014-07-17 DIAGNOSIS — N183 Chronic kidney disease, stage 3 unspecified: Secondary | ICD-10-CM

## 2014-07-17 DIAGNOSIS — E1151 Type 2 diabetes mellitus with diabetic peripheral angiopathy without gangrene: Secondary | ICD-10-CM

## 2014-07-17 DIAGNOSIS — I739 Peripheral vascular disease, unspecified: Secondary | ICD-10-CM

## 2014-07-17 DIAGNOSIS — I482 Chronic atrial fibrillation, unspecified: Secondary | ICD-10-CM

## 2014-07-17 DIAGNOSIS — E1159 Type 2 diabetes mellitus with other circulatory complications: Secondary | ICD-10-CM

## 2014-07-17 NOTE — Progress Notes (Signed)
Subjective:    Patient ID: Eric [name]., male    DOB: 21-Jul-1928, 80 y.o.   MRN: [id number]  HPI ppatient is a very pleasant 80 year old white male here today to establish care. He has a significant history of coronary artery disease and peripheral vascular disease. He also has a history of atrial fibrillation. He is currently on long-term anticoagulation with Coumadin. He is rate controlled and still in atrial fibrillation. He also has a past medical history of hypertension, hyperlipidemia, and diet-controlled diabetes mellitus. He also has a history of stage III chronic kidney disease. His flu shot is up-to-date. He is due for Prevnar 13. He has had Pneumovax.  He has no major concerns today other than to establish care. His blood pressures well controlled 128/80. He denies any chest pain shortness of breath or dyspnea on exertion. His diabetic eye exam is up-to-date. His diabetic foot exam today shows pre-ulcerative calluses on the lateral aspects of both fifth MTP joints.  I recommended that he see his podiatrist Dr. Felisa Bonier. He is overdue for hemoglobin A1c. He is also overdue for fasting lipid panel.  He denies any myalgias or right upper quadrant pain. Past Medical History  Diagnosis Date  . CAD (coronary artery disease)     cath 2003, occluded S-RCA, occluded S-Dx, L-LAD ok, s/p PTCA to LAD  . Atrial fibrillation   . Atrial flutter     s/p RFCA  . Long term (current) use of anticoagulants   . Pure hyperglyceridemia   . Unspecified essential hypertension   . Diabetes mellitus   . OSA (obstructive sleep apnea) 12/11    very mild, AHI 7/hr  . PVD (peripheral vascular disease)     angioplasty of his right lower extremity in [place] by Dr.[name] 2013  . Cataract   . Neuromuscular disorder    Past Surgical History  Procedure Laterality Date  . Nm myoview ltd  4/11    normal  . Angioplasty  1/99    CAD- diogonal with rotational artherectomy  . Carpal tunnel release      ?  bilateral  . Cardiac catheterization  1/00  . Arthrectomy      of LAD & PTCA  . Coronary artery bypass graft  2000  . Ptca  4/03    admit unstable angina  . Cardioversion  1/04  . Cardioversion  5/07    hospital- a flutter  . Adenosine myoview  3/06    EF 56%, neg. Ischemia  . Adenosine myoview  02/18/07    nml  . Hand surgery  08/27/09    R thumb procedure wit Scaphoid Gragt and screws, Dr [name]  . Colonoscopy w/ biopsies  10/01/06    sigmoid polyp bx neg, 3 years  . Fracture surgery     Current Outpatient Prescriptions on File Prior to Visit  Medication Sig Dispense Refill  . acetaminophen (TYLENOL) 500 MG tablet 2 tablets by mouth as needed pain     . amLODipine (NORVASC) 5 MG tablet Take 1 tablet (5 mg total) by mouth daily. 90 tablet 3  . atorvastatin (LIPITOR) 20 MG tablet Take 1 tablet (20 mg total) by mouth daily. 90 tablet 3  . B Complex Vitamins (B COMPLEX-B12) TABS Take 1 tablet by mouth. 1000 mcg daily     . cholecalciferol (VITAMIN D) 1000 UNITS tablet Take 1,000 Units by mouth 2 (two) times a week.      [name] CINNAMON PO Take by mouth 2 (two) times daily.     [name]  fenofibrate 160 MG tablet Take 1 tablet (160 mg total) by mouth daily. 90 tablet 3  . fish oil-omega-3 fatty acids 1000 MG capsule Take 2 g by mouth 3 (three) times daily.     . furosemide (LASIX) 20 MG tablet Take 1 tablet (20 mg total) by mouth daily. 90 tablet 3  . lisinopril (PRINIVIL,ZESTRIL) 20 MG tablet Take 1 tablet (20 mg total) by mouth daily. 90 tablet 3  . metoprolol (LOPRESSOR) 50 MG tablet Take 1.5 tablets (75 mg total) by mouth 2 (two) times daily. 135 tablet 3  . Multiple Vitamin (DAILY MULTIVITAMIN PO) Take 1 tablet by mouth daily.      [name] warfarin (COUMADIN) 2 MG tablet Take as directed by coumadin clinic 90 tablet 1  . traMADol (ULTRAM) 50 MG tablet Take 1 tablet (50 mg total) by mouth every 12 (twelve) hours as needed for moderate pain. (Patient not taking: Reported on 07/17/2014) 20 tablet 0   No  current facility-administered medications on file prior to visit.   Allergies  Allergen Reactions  . Isosorbide Mononitrate     REACTION: unspecified  . Quinidine Gluconate     REACTION: unspecified  . Ramipril     REACTION: cough   History   Social History  . Marital Status: Married    Spouse Name: N/A    Number of Children: 31  . Years of Education: N/A   Occupational History  . [employer]    Social History Main Topics  . Smoking status: Never Smoker   . Smokeless tobacco: Former Systems developer  . Alcohol Use: No  . Drug Use: No  . Sexual Activity: No   Other Topics Concern  . 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      Objective:   Physical Exam  Constitutional: He is oriented to person, place, and time. He appears well-developed and well-nourished.  Eyes: Conjunctivae are normal.  Neck: Neck supple. No JVD present. No thyromegaly present.  Cardiovascular: Normal rate.  An irregularly irregular rhythm present. Exam reveals no gallop and no friction rub.   No murmur heard. Pulmonary/Chest: Effort normal and breath sounds normal. No respiratory distress. He has no wheezes. He has no rales. He exhibits no tenderness.  Abdominal: Soft. Bowel sounds are normal. He exhibits no distension and no mass. There is no tenderness. There is no rebound and no guarding.  Musculoskeletal: He exhibits no edema.  Lymphadenopathy:    He has no cervical adenopathy.  Neurological: He is alert and oriented to person, place, and time. He has normal reflexes. He displays normal reflexes. No cranial nerve deficit. He exhibits normal muscle tone. Coordination normal.  Skin: No rash noted. No erythema. No pallor.  Vitals reviewed.         Assessment & Plan:  Diabetes mellitus type 2 with peripheral artery  disease  Chronic atrial fibrillation  HYPERTRIGLYCERIDEMIA  Chronic kidney disease, stage III (moderate)  PVD (peripheral vascular disease)  Patient's blood pressures well controlled today. I like the patient return fasting so that I can check a hemoglobin A1c. He is also overdue for a CMP as well as a fasting lipid panel. LDL cholesterol is less than 70. Goal hemoglobin A1c is less than 6.5. I would also like  to monitor his creatinine to monitor his chronic kidney disease. Patient's diabetic foot exam is up-to-date. I recommended he follow-up with his podiatrist to monitor and treat the calluses that are forming on his feet particularly given his significant diabetic neuropathy. His immunizations are up-to-date. I did recommend Prevnar 13.

## 2014-07-18 ENCOUNTER — Other Ambulatory Visit: Payer: BLUE CROSS/BLUE SHIELD

## 2014-07-18 DIAGNOSIS — I739 Peripheral vascular disease, unspecified: Secondary | ICD-10-CM

## 2014-07-18 DIAGNOSIS — E1151 Type 2 diabetes mellitus with diabetic peripheral angiopathy without gangrene: Secondary | ICD-10-CM

## 2014-07-18 DIAGNOSIS — E781 Pure hyperglyceridemia: Secondary | ICD-10-CM

## 2014-07-18 DIAGNOSIS — I482 Chronic atrial fibrillation, unspecified: Secondary | ICD-10-CM

## 2014-07-18 LAB — COMPLETE METABOLIC PANEL WITH GFR
ALBUMIN: 4.3 g/dL (ref 3.5–5.2)
ALT: 23 U/L (ref 0–53)
AST: 34 U/L (ref 0–37)
Alkaline Phosphatase: 29 U/L — ABNORMAL LOW (ref 39–117)
BUN: 19 mg/dL (ref 6–23)
CO2: 27 meq/L (ref 19–32)
CREATININE: 1.4 mg/dL — AB (ref 0.50–1.35)
Calcium: 9.8 mg/dL (ref 8.4–10.5)
Chloride: 102 mEq/L (ref 96–112)
GFR, EST AFRICAN AMERICAN: 53 mL/min — AB
GFR, Est Non African American: 46 mL/min — ABNORMAL LOW
Glucose, Bld: 124 mg/dL — ABNORMAL HIGH (ref 70–99)
POTASSIUM: 5.3 meq/L (ref 3.5–5.3)
Sodium: 139 mEq/L (ref 135–145)
Total Bilirubin: 0.7 mg/dL (ref 0.2–1.2)
Total Protein: 7.4 g/dL (ref 6.0–8.3)

## 2014-07-18 LAB — CBC WITH DIFFERENTIAL/PLATELET
Basophils Absolute: 0.1 10*3/uL (ref 0.0–0.1)
Basophils Relative: 1 % (ref 0–1)
EOS PCT: 4 % (ref 0–5)
Eosinophils Absolute: 0.4 10*3/uL (ref 0.0–0.7)
HCT: 42.7 % (ref 39.0–52.0)
Hemoglobin: 14.6 g/dL (ref 13.0–17.0)
LYMPHS PCT: 32 % (ref 12–46)
Lymphs Abs: 2.8 10*3/uL (ref 0.7–4.0)
MCH: 31.3 pg (ref 26.0–34.0)
MCHC: 34.2 g/dL (ref 30.0–36.0)
MCV: 91.4 fL (ref 78.0–100.0)
MPV: 9.8 fL (ref 9.4–12.4)
Monocytes Absolute: 0.6 10*3/uL (ref 0.1–1.0)
Monocytes Relative: 7 % (ref 3–12)
Neutro Abs: 5 10*3/uL (ref 1.7–7.7)
Neutrophils Relative %: 56 % (ref 43–77)
PLATELETS: 191 10*3/uL (ref 150–400)
RBC: 4.67 MIL/uL (ref 4.22–5.81)
RDW: 15.5 % (ref 11.5–15.5)
WBC: 8.9 10*3/uL (ref 4.0–10.5)

## 2014-07-18 LAB — LIPID PANEL
Cholesterol: 183 mg/dL (ref 0–200)
HDL: 27 mg/dL — ABNORMAL LOW (ref >39)
LDL CALC: 102 mg/dL — AB (ref 0–99)
TRIGLYCERIDES: 269 mg/dL — AB (ref <150)
Total CHOL/HDL Ratio: 6.8 Ratio
VLDL: 54 mg/dL — ABNORMAL HIGH (ref 0–40)

## 2014-07-18 LAB — HEMOGLOBIN A1C
Hgb A1c MFr Bld: 6.4 % — ABNORMAL HIGH (ref <5.7)
Mean Plasma Glucose: 137 mg/dL — ABNORMAL HIGH (ref <117)

## 2014-07-18 LAB — MICROALBUMIN, URINE: MICROALB UR: 7.4 mg/dL — AB (ref <2.0)

## 2014-07-20 ENCOUNTER — Other Ambulatory Visit: Payer: Self-pay | Admitting: *Deleted

## 2014-07-20 MED ORDER — LISINOPRIL 40 MG PO TABS: 40.0000 mg | ORAL_TABLET | Freq: Every day | ORAL | Status: DC

## 2014-07-20 MED ORDER — ATORVASTATIN CALCIUM 40 MG PO TABS: 40.0000 mg | ORAL_TABLET | Freq: Every day | ORAL | Status: DC

## 2014-07-26 ENCOUNTER — Ambulatory Visit (INDEPENDENT_AMBULATORY_CARE_PROVIDER_SITE_OTHER): Payer: Medicare Other | Admitting: *Deleted

## 2014-07-26 DIAGNOSIS — I4892 Unspecified atrial flutter: Secondary | ICD-10-CM

## 2014-07-26 DIAGNOSIS — Z5181 Encounter for therapeutic drug level monitoring: Secondary | ICD-10-CM

## 2014-07-26 DIAGNOSIS — I482 Chronic atrial fibrillation, unspecified: Secondary | ICD-10-CM

## 2014-07-26 DIAGNOSIS — I4891 Unspecified atrial fibrillation: Secondary | ICD-10-CM

## 2014-07-26 DIAGNOSIS — Z7901 Long term (current) use of anticoagulants: Secondary | ICD-10-CM

## 2014-07-26 LAB — POCT INR: INR: 1.6

## 2014-08-09 ENCOUNTER — Ambulatory Visit (INDEPENDENT_AMBULATORY_CARE_PROVIDER_SITE_OTHER): Payer: Medicare Other | Admitting: Pharmacist

## 2014-08-09 DIAGNOSIS — Z7901 Long term (current) use of anticoagulants: Secondary | ICD-10-CM

## 2014-08-09 DIAGNOSIS — I482 Chronic atrial fibrillation, unspecified: Secondary | ICD-10-CM

## 2014-08-09 DIAGNOSIS — I4892 Unspecified atrial flutter: Secondary | ICD-10-CM

## 2014-08-09 DIAGNOSIS — Z5181 Encounter for therapeutic drug level monitoring: Secondary | ICD-10-CM

## 2014-08-09 DIAGNOSIS — I4891 Unspecified atrial fibrillation: Secondary | ICD-10-CM

## 2014-08-09 LAB — POCT INR: INR: 2

## 2014-08-29 ENCOUNTER — Ambulatory Visit (INDEPENDENT_AMBULATORY_CARE_PROVIDER_SITE_OTHER): Payer: PPO | Admitting: *Deleted

## 2014-08-29 DIAGNOSIS — I482 Chronic atrial fibrillation, unspecified: Secondary | ICD-10-CM

## 2014-08-29 DIAGNOSIS — I4891 Unspecified atrial fibrillation: Secondary | ICD-10-CM

## 2014-08-29 DIAGNOSIS — Z7901 Long term (current) use of anticoagulants: Secondary | ICD-10-CM

## 2014-08-29 DIAGNOSIS — I4892 Unspecified atrial flutter: Secondary | ICD-10-CM

## 2014-08-29 DIAGNOSIS — Z5181 Encounter for therapeutic drug level monitoring: Secondary | ICD-10-CM

## 2014-08-29 LAB — POCT INR: INR: 2.8

## 2014-09-08 ENCOUNTER — Encounter (HOSPITAL_COMMUNITY): Payer: Self-pay | Admitting: Emergency Medicine

## 2014-09-08 ENCOUNTER — Emergency Department (HOSPITAL_COMMUNITY)
Admission: EM | Admit: 2014-09-08 | Discharge: 2014-09-08 | Disposition: A | Discharge status: Home / Self Care | Payer: PPO | Attending: Emergency Medicine | Admitting: Emergency Medicine

## 2014-09-08 ENCOUNTER — Emergency Department (HOSPITAL_COMMUNITY): Payer: PPO

## 2014-09-08 DIAGNOSIS — Z9981 Dependence on supplemental oxygen: Secondary | ICD-10-CM | POA: Insufficient documentation

## 2014-09-08 DIAGNOSIS — Y9389 Activity, other specified: Secondary | ICD-10-CM | POA: Insufficient documentation

## 2014-09-08 DIAGNOSIS — Y998 Other external cause status: Secondary | ICD-10-CM | POA: Diagnosis not present

## 2014-09-08 DIAGNOSIS — Z79899 Other long term (current) drug therapy: Secondary | ICD-10-CM | POA: Insufficient documentation

## 2014-09-08 DIAGNOSIS — G4730 Sleep apnea, unspecified: Secondary | ICD-10-CM | POA: Diagnosis not present

## 2014-09-08 DIAGNOSIS — H269 Unspecified cataract: Secondary | ICD-10-CM | POA: Diagnosis not present

## 2014-09-08 DIAGNOSIS — E781 Pure hyperglyceridemia: Secondary | ICD-10-CM | POA: Insufficient documentation

## 2014-09-08 DIAGNOSIS — I2510 Atherosclerotic heart disease of native coronary artery without angina pectoris: Secondary | ICD-10-CM | POA: Diagnosis not present

## 2014-09-08 DIAGNOSIS — Z9861 Coronary angioplasty status: Secondary | ICD-10-CM | POA: Insufficient documentation

## 2014-09-08 DIAGNOSIS — I10 Essential (primary) hypertension: Secondary | ICD-10-CM | POA: Diagnosis not present

## 2014-09-08 DIAGNOSIS — Z951 Presence of aortocoronary bypass graft: Secondary | ICD-10-CM | POA: Insufficient documentation

## 2014-09-08 DIAGNOSIS — S42255A Nondisplaced fracture of greater tuberosity of left humerus, initial encounter for closed fracture: Secondary | ICD-10-CM | POA: Insufficient documentation

## 2014-09-08 DIAGNOSIS — E119 Type 2 diabetes mellitus without complications: Secondary | ICD-10-CM | POA: Diagnosis not present

## 2014-09-08 DIAGNOSIS — Z7901 Long term (current) use of anticoagulants: Secondary | ICD-10-CM | POA: Insufficient documentation

## 2014-09-08 DIAGNOSIS — Y9289 Other specified places as the place of occurrence of the external cause: Secondary | ICD-10-CM | POA: Insufficient documentation

## 2014-09-08 DIAGNOSIS — S42202A Unspecified fracture of upper end of left humerus, initial encounter for closed fracture: Secondary | ICD-10-CM

## 2014-09-08 DIAGNOSIS — I4891 Unspecified atrial fibrillation: Secondary | ICD-10-CM | POA: Diagnosis not present

## 2014-09-08 DIAGNOSIS — W010XXA Fall on same level from slipping, tripping and stumbling without subsequent striking against object, initial encounter: Secondary | ICD-10-CM | POA: Insufficient documentation

## 2014-09-08 DIAGNOSIS — S4992XA Unspecified injury of left shoulder and upper arm, initial encounter: Secondary | ICD-10-CM | POA: Diagnosis present

## 2014-09-08 NOTE — ED Notes (Addendum)
Pt reports slipped on ice approximately 2 hours ago. Pt reports is on coumadin. Pt denies hitting head or loc. Distal pulses moderate in strength.

## 2014-09-08 NOTE — Discharge Instructions (Signed)
Keep the sling in place. Follow-up with orthopedics. Take Tylenol as needed for pain. Return for any new or worse symptoms.

## 2014-09-08 NOTE — ED Provider Notes (Signed)
CSN: [id number]     Arrival date & time 09/08/14  1511 History   First MD Initiated Contact with Patient 09/08/14 1629     Chief Complaint  Patient presents with  . Fall     (Consider location/radiation/quality/duration/timing/severity/associated sxs/prior Treatment) Patient is a 81 y.o. male presenting with fall. The history is provided by the patient.  Fall This is a new problem. Pertinent negatives include no chest pain, no abdominal pain, no headaches and no shortness of breath.   Patient with history of fall today approximate 2 hours prior to arrival. Patient with complaint of pain to his left shoulder no other injuries. Did not strike his head no loss of consciousness. Patient is on Coumadin. Patient denies neck pain or back pain. No injuries to the legs.  Past Medical History  Diagnosis Date  . CAD (coronary artery disease)     cath 2003, occluded S-RCA, occluded S-Dx, L-LAD ok, s/p PTCA to LAD  . Atrial fibrillation   . Atrial flutter     s/p RFCA  . Long term (current) use of anticoagulants   . Pure hyperglyceridemia   . Unspecified essential hypertension   . Diabetes mellitus   . OSA (obstructive sleep apnea) 12/11    very mild, AHI 7/hr  . PVD (peripheral vascular disease)     angioplasty of his right lower extremity in [place] by Dr.[name] 2013  . Cataract   . Neuromuscular disorder    Past Surgical History  Procedure Laterality Date  . Nm myoview ltd  4/11    normal  . Angioplasty  1/99    CAD- diogonal with rotational artherectomy  . Carpal tunnel release      ? bilateral  . Cardiac catheterization  1/00  . Arthrectomy      of LAD & PTCA  . Coronary artery bypass graft  2000  . Ptca  4/03    admit unstable angina  . Cardioversion  1/04  . Cardioversion  5/07    hospital- a flutter  . Adenosine myoview  3/06    EF 56%, neg. Ischemia  . Adenosine myoview  02/18/07    nml  . Hand surgery  08/27/09    R thumb procedure wit Scaphoid Gragt and screws, Dr  [name]  . Colonoscopy w/ biopsies  10/01/06    sigmoid polyp bx neg, 3 years  . Fracture surgery     Family History  Problem Relation Age of Onset  . Stroke Father   . Aneurysm Father   . Hypertension Father   . Prostate cancer Brother   . Hypertension Mother   . Lung cancer Brother     smoker  . Thrombosis Brother   . Other Brother     RF valve disorder (smoker)  . Lung cancer Brother     smoker  . Other Sister     cerebral hemm  . Breast cancer Sister   . Liver cancer Brother    History  Substance Use Topics  . Smoking status: Never Smoker   . Smokeless tobacco: Former Systems developer  . Alcohol Use: No    Review of Systems  Constitutional: Negative for fever.  HENT: Negative for congestion.   Eyes: Negative for redness.  Respiratory: Negative for shortness of breath.   Cardiovascular: Negative for chest pain.  Gastrointestinal: Negative for nausea, vomiting and abdominal pain.  Genitourinary: Negative for dysuria.  Musculoskeletal: Negative for back pain and neck pain.  Skin: Negative for rash.  Neurological: Negative for headaches.  Hematological: Does not bruise/bleed easily.  Psychiatric/Behavioral: Negative for confusion.      Allergies  Isosorbide mononitrate; Quinidine gluconate; and Ramipril  Home Medications   Prior to Admission medications   Medication Sig Start Date End Date Taking? Authorizing Provider  amLODipine (NORVASC) 5 MG tablet Take 1 tablet (5 mg total) by mouth daily. 05/07/14  Yes Lelon Perla, MD  atorvastatin (LIPITOR) 40 MG tablet Take 1 tablet (40 mg total) by mouth daily. 07/20/14  Yes [name], MD  B Complex Vitamins (B COMPLEX-B12) TABS Take 1 tablet by mouth daily.    Yes Historical Provider, MD  cholecalciferol (VITAMIN D) 1000 UNITS tablet Take 1,000 Units by mouth 2 (two) times a week. Takes on Mon and Fri   Yes Historical Provider, MD  CINNAMON PO Take by mouth 2 (two) times daily.    Yes Historical Provider, MD   fenofibrate 160 MG tablet Take 1 tablet (160 mg total) by mouth daily. 01/05/14  Yes Tonia Ghent, MD  fish oil-omega-3 fatty acids 1000 MG capsule Take 3 g by mouth daily.    Yes Historical Provider, MD  furosemide (LASIX) 20 MG tablet Take 1 tablet (20 mg total) by mouth daily. 01/05/14  Yes Tonia Ghent, MD  lisinopril (PRINIVIL,ZESTRIL) 40 MG tablet Take 1 tablet (40 mg total) by mouth daily. 07/20/14  Yes [name], MD  metoprolol (LOPRESSOR) 50 MG tablet Take 1.5 tablets (75 mg total) by mouth 2 (two) times daily. 05/07/14  Yes Lelon Perla, MD  Multiple Vitamin (DAILY MULTIVITAMIN PO) Take 1 tablet by mouth daily.     Yes Historical Provider, MD  warfarin (COUMADIN) 2 MG tablet Take as directed by coumadin clinic Patient taking differently: Take 1-2 mg by mouth daily. Takes 2 mg everyday except for Wednesday when patient takes 1 mg 07/02/14  Yes Lelon Perla, MD  acetaminophen (TYLENOL) 500 MG tablet Take 1,000 mg by mouth every 8 (eight) hours as needed for mild pain.     Historical Provider, MD  traMADol (ULTRAM) 50 MG tablet Take 1 tablet (50 mg total) by mouth every 12 (twelve) hours as needed for moderate pain. Patient not taking: Reported on 07/17/2014 05/25/14   Tonia Ghent, MD   BP 171/66 mmHg  Pulse 59  Temp(Src) 97.7 F (36.5 C) (Oral)  Resp 16  SpO2 100% Physical Exam  Constitutional: He is oriented to person, place, and time. He appears well-developed and well-nourished. No distress.  HENT:  Head: Normocephalic and atraumatic.  Mouth/Throat: Oropharynx is clear and moist.  Eyes: Conjunctivae and EOM are normal. Pupils are equal, round, and reactive to light.  Neck: Normal range of motion.  Cardiovascular: Normal rate and normal heart sounds.   Pulmonary/Chest: Effort normal and breath sounds normal.  Abdominal: Soft. Bowel sounds are normal. There is no tenderness.  Musculoskeletal: Normal range of motion. He exhibits tenderness.  Except for left  shoulder with tenderness approximately no deformity. No tenderness at elbow no tenderness at the wrist. Sensation intact in the left hand cap refills are 1 second. Radial pulses 2+.  Neurological: He is alert and oriented to person, place, and time. No cranial nerve deficit. He exhibits normal muscle tone. Coordination normal.  Skin: Skin is warm. No rash noted.  Nursing note and vitals reviewed.   ED Course  Procedures (including critical care time) Labs Review Labs Reviewed - No data to display  Imaging Review Dg Humerus Left  09/08/2014   CLINICAL  DATA:  Status post fall on ice today. Left upper arm pain. Initial encounter.  EXAM: LEFT HUMERUS - 2+ VIEW  COMPARISON:  None.  FINDINGS: The patient has a mildly displaced fracture of the left greater tuberosity. No other acute bony or joint abnormality is identified.  IMPRESSION: Nondisplaced fracture left greater tuberosity.   Electronically Signed   By: Inge Rise M.D.   On: 09/08/2014 16:18     EKG Interpretation None      MDM   Final diagnoses:  Proximal humerus fracture, left, closed, initial encounter    Patient is on Coumadin. Patient with a fall today slipped on the ice pain to left shoulder. No other injuries. Denied his head. No loss of consciousness.   X-rays show a nondisplaced proximal humerus fracture. Patient was treated with the sling and follow-up with orthopedics. Patient does not want any significant pain medicine states he is comfortable with taking just Tylenol.    Fredia Sorrow, MD 09/08/14 1800

## 2014-09-17 ENCOUNTER — Ambulatory Visit (INDEPENDENT_AMBULATORY_CARE_PROVIDER_SITE_OTHER): Payer: PPO

## 2014-09-17 DIAGNOSIS — I4891 Unspecified atrial fibrillation: Secondary | ICD-10-CM

## 2014-09-17 DIAGNOSIS — I4892 Unspecified atrial flutter: Secondary | ICD-10-CM

## 2014-09-17 DIAGNOSIS — Z7901 Long term (current) use of anticoagulants: Secondary | ICD-10-CM

## 2014-09-17 DIAGNOSIS — Z5181 Encounter for therapeutic drug level monitoring: Secondary | ICD-10-CM

## 2014-09-17 LAB — POCT INR: INR: 2.4

## 2014-09-26 ENCOUNTER — Other Ambulatory Visit: Payer: Self-pay | Admitting: Family Medicine

## 2014-09-26 DIAGNOSIS — I739 Peripheral vascular disease, unspecified: Secondary | ICD-10-CM

## 2014-09-26 DIAGNOSIS — I482 Chronic atrial fibrillation, unspecified: Secondary | ICD-10-CM

## 2014-09-26 DIAGNOSIS — I10 Essential (primary) hypertension: Secondary | ICD-10-CM

## 2014-09-26 DIAGNOSIS — I2510 Atherosclerotic heart disease of native coronary artery without angina pectoris: Secondary | ICD-10-CM

## 2014-09-26 DIAGNOSIS — I4891 Unspecified atrial fibrillation: Secondary | ICD-10-CM

## 2014-09-26 MED ORDER — METOPROLOL TARTRATE 50 MG PO TABS: 75.0000 mg | ORAL_TABLET | Freq: Two times a day (BID) | ORAL | Status: DC

## 2014-09-26 NOTE — Telephone Encounter (Signed)
Pt sent hand written note stating that he needed a refill on Metoprolol to be sent to [facility] mail order - med sent to requested pharm.

## 2014-10-01 ENCOUNTER — Telehealth: Payer: Self-pay | Admitting: Family Medicine

## 2014-10-01 DIAGNOSIS — I10 Essential (primary) hypertension: Secondary | ICD-10-CM

## 2014-10-01 DIAGNOSIS — I4891 Unspecified atrial fibrillation: Secondary | ICD-10-CM

## 2014-10-01 DIAGNOSIS — I482 Chronic atrial fibrillation, unspecified: Secondary | ICD-10-CM

## 2014-10-01 DIAGNOSIS — I2510 Atherosclerotic heart disease of native coronary artery without angina pectoris: Secondary | ICD-10-CM

## 2014-10-01 DIAGNOSIS — I739 Peripheral vascular disease, unspecified: Secondary | ICD-10-CM

## 2014-10-01 NOTE — Telephone Encounter (Signed)
Patient calling to rx sent to [facility]  For test strips and lancets  Best number for patient is [phone] [place] is [phone]

## 2014-10-02 NOTE — Telephone Encounter (Signed)
Tried to call - no answer and no vm - Need to know what kind of test strips and lancets and how often ck BS

## 2014-10-03 NOTE — Telephone Encounter (Signed)
Tried to call no answer no vm 

## 2014-10-05 MED ORDER — FUROSEMIDE 20 MG PO TABS: 20.0000 mg | ORAL_TABLET | Freq: Every day | ORAL | Status: DC

## 2014-10-05 MED ORDER — ATORVASTATIN CALCIUM 40 MG PO TABS: 40.0000 mg | ORAL_TABLET | Freq: Every day | ORAL | Status: DC

## 2014-10-05 MED ORDER — FENOFIBRATE 160 MG PO TABS: 160.0000 mg | ORAL_TABLET | Freq: Every day | ORAL | Status: DC

## 2014-10-05 MED ORDER — METOPROLOL TARTRATE 50 MG PO TABS: 75.0000 mg | ORAL_TABLET | Freq: Two times a day (BID) | ORAL | Status: DC

## 2014-10-05 MED ORDER — LISINOPRIL 40 MG PO TABS: 40.0000 mg | ORAL_TABLET | Freq: Every day | ORAL | Status: DC

## 2014-10-05 MED ORDER — AMLODIPINE BESYLATE 5 MG PO TABS: 5.0000 mg | ORAL_TABLET | Freq: Every day | ORAL | Status: DC

## 2014-10-05 NOTE — Telephone Encounter (Signed)
Spoke to pt's wife and pt needed on med filled for 90 days and the rest of his meds as well but just to put on file. Med sent to pharm and refilled other meds to be put on file.

## 2014-10-15 ENCOUNTER — Ambulatory Visit (INDEPENDENT_AMBULATORY_CARE_PROVIDER_SITE_OTHER): Payer: PPO | Admitting: Pharmacist

## 2014-10-15 DIAGNOSIS — Z5181 Encounter for therapeutic drug level monitoring: Secondary | ICD-10-CM

## 2014-10-15 DIAGNOSIS — Z7901 Long term (current) use of anticoagulants: Secondary | ICD-10-CM

## 2014-10-15 DIAGNOSIS — I4891 Unspecified atrial fibrillation: Secondary | ICD-10-CM

## 2014-10-15 DIAGNOSIS — I4892 Unspecified atrial flutter: Secondary | ICD-10-CM

## 2014-10-15 LAB — POCT INR: INR: 2.3

## 2014-10-24 ENCOUNTER — Ambulatory Visit: Payer: PPO | Attending: Orthopaedic Surgery | Admitting: Physical Therapy

## 2014-10-24 DIAGNOSIS — M25512 Pain in left shoulder: Secondary | ICD-10-CM

## 2014-10-24 DIAGNOSIS — M25612 Stiffness of left shoulder, not elsewhere classified: Secondary | ICD-10-CM | POA: Diagnosis present

## 2014-10-24 DIAGNOSIS — R29898 Other symptoms and signs involving the musculoskeletal system: Secondary | ICD-10-CM | POA: Diagnosis not present

## 2014-10-24 DIAGNOSIS — R293 Abnormal posture: Secondary | ICD-10-CM | POA: Diagnosis not present

## 2014-10-24 NOTE — Patient Instructions (Signed)
Pendulum Circles    Let right arm move in circle clockwise, then counterclockwise, by rocking body weight in circular pattern. Circle __10__ times each direction per set. Do _1-2___ sets per session. Do _2___ sessions per day.  http://orth.exer.us/794      Copyright  VHI. All rights reserved.  Pendulum Side to Side   Bend forward 90 at waist, leaning on table for support. Rock body from side to side and let arm swing freely. Repeat _10___ times. Do __2__ sessions per day.  Copyright  VHI. All rights reserved.  Closed Chain: Shoulder Flexion - Counter Level   Hands forward on counter, step backward. Stepping causes shoulder flexion. THEN SLIDE HAND FORWARD x 5. SLIDE HAND OUT TO THE SIDE X5 If your back hurts, sit to slide hands forward and out to the side Step backward __5_ times, each foot, 2___ times per day.  http://ss.exer.us/256   Copyright  VHI. All rights reserved.

## 2014-10-24 NOTE — Therapy (Signed)
[place], [place], [address] Phone: [phone]   Fax:  [phone]  Physical Therapy Evaluation  Patient Details  Name: Eric [name]. MRN: [id number] Date of Birth: [birth year] Referring Provider:  [name], MD  Encounter Date: 10/24/2014      PT End of Session - 10/24/14 1010    Visit Number 1   Number of Visits 16   Date for PT Re-Evaluation 12/19/14   PT Start Time 0932   PT Stop Time 1010   PT Time Calculation (min) 38 min   Activity Tolerance Patient tolerated treatment well      Past Medical History  Diagnosis Date  . CAD (coronary artery disease)     cath 2003, occluded S-RCA, occluded S-Dx, L-LAD ok, s/p PTCA to LAD  . Atrial fibrillation   . Atrial flutter     s/p RFCA  . Long term (current) use of anticoagulants   . Pure hyperglyceridemia   . Unspecified essential hypertension   . Diabetes mellitus   . OSA (obstructive sleep apnea) 12/11    very mild, AHI 7/hr  . PVD (peripheral vascular disease)     angioplasty of his right lower extremity in [place] by Dr.[name] 2013  . Cataract   . Neuromuscular disorder     Past Surgical History  Procedure Laterality Date  . Nm myoview ltd  4/11    normal  . Angioplasty  1/99    CAD- diogonal with rotational artherectomy  . Carpal tunnel release      ? bilateral  . Cardiac catheterization  1/00  . Arthrectomy      of LAD & PTCA  . Coronary artery bypass graft  2000  . Ptca  4/03    admit unstable angina  . Cardioversion  1/04  . Cardioversion  5/07    hospital- a flutter  . Adenosine myoview  3/06    EF 56%, neg. Ischemia  . Adenosine myoview  02/18/07    nml  . Hand surgery  08/27/09    R thumb procedure wit Scaphoid Gragt and screws, Dr [name]  . Colonoscopy w/ biopsies  10/01/06    sigmoid polyp bx neg, 3 years  . Fracture surgery      There were no vitals filed for this visit.  Visit Diagnosis:  Stiffness of  shoulder joint, left  Weakness of left upper extremity  Pain in joint, shoulder region, left  Poor posture      Subjective Assessment - 10/24/14 0941    Symptoms Pt fell approx. 8 weeks ago on ice and suffered a fx.     Limitations Lifting;House hold activities   How long can you sit comfortably? as needed   How long can you stand comfortably? as needed as long as arm is supported   How long can you walk comfortably? as needed as long as arm is supported   Diagnostic tests XR   Patient Stated Goals Patient would like to gain more strength/use of LUE   Pain Score 2    Pain Location Arm   Pain Orientation Left;Anterior;Mid   Pain Descriptors / Indicators Sore;Aching   Pain Type Chronic pain   Pain Radiating Towards occ into lower arm   Pain Onset More than a month ago   Pain Frequency Intermittent   Aggravating Factors  using LUE, dependent position   Pain Relieving Factors sling, rest, Tylenol/Tramadol   Multiple Pain Sites No  [facility] PT Assessment - 10/24/14 0946    Assessment   Medical Diagnosis L Gr tubercle fx   Onset Date 09/08/14   Next MD Visit not known   Prior Therapy none   Precautions   Precautions None   Precaution Comments advised AAROM, PROM, pendulum   Restrictions   Weight Bearing Restrictions No   Balance Screen   Has the patient fallen in the past 6 months Yes   How many times? 1   Has the patient had a decrease in activity level because of a fear of falling?  No   Is the patient reluctant to leave their home because of a fear of falling?  No   Home Environment   Living Enviornment Private residence   Living Arrangements Spouse/significant other   Prior Function   Level of Independence Independent with basic ADLs   Cognition   Overall Cognitive Status Within Functional Limits for tasks assessed   Observation/Other Assessments   Quick DASH  34.1 (66% impaired)   Sensation   Light Touch Appears Intact   Posture/Postural Control    Posture/Postural Control Postural limitations   Postural Limitations Rounded Shoulders;Forward head;Posterior pelvic tilt   AROM   AROM Assessment Site --  seated   Right Shoulder Flexion 134 Degrees   Right Shoulder ABduction 135 Degrees   Left Shoulder Flexion 83 Degrees  seated   Left Shoulder ABduction 38 Degrees  seated   PROM   Left Shoulder Flexion 110 Degrees   Left Shoulder ABduction 90 Degrees   Left Shoulder Internal Rotation 75 Degrees   Left Shoulder External Rotation 55 Degrees   Strength   Right Shoulder Flexion 4-/5   Left Shoulder Flexion 3-/5  pain    Left Shoulder Internal Rotation 3+/5   Left Shoulder External Rotation 3/5   Right Elbow Flexion 5/5   Left Elbow Flexion 4/5   Palpation   Palpation min tenderness ant/lat deltoid     Demo pendulums and table slides in standing and sitting for AAROM, PROM.       PT Education - 10/24/14 1009    Education provided Yes   Education Details PT/POC, HEP   Person(s) Educated Patient   Methods Explanation   Comprehension Verbalized understanding;Returned demonstration;Need further instruction          PT Short Term Goals - 10/24/14 1416    PT SHORT TERM GOAL #1   Title Pt will be I with initial HEP for AAROM, strength   Time 4   Period Weeks   Status New   PT SHORT TERM GOAL #2   Title Pt will be able to demo 4/5 strength in LUE flexion and abd in avail. ROM   Time 4   Period Weeks   Status New   PT SHORT TERM GOAL #3   Title Pt will be able to complete ADLs with min difficulty   Time 4   Period Weeks   Status New           PT Long Term Goals - 10/24/14 1417    PT LONG TERM GOAL #1   Title Pt will be I with advanced HEP and concepts of RICE, posture    Time 8   Period Weeks   Status New   PT LONG TERM GOAL #2   Title Pt will be able to lift LUE to 120 deg with min trunk compensation for improved functional reach.    Time 4   Period Weeks   Status  New   PT LONG TERM GOAL #3   Title  Pt will be able to report no pain with UE activities below shoulder height   Time 8   Period Weeks   Status New   PT LONG TERM GOAL #4   Title Pt will be able to reach to shoulder height for lifting light items in the home without lasting pain   Time 8   Period Weeks   Status New               Plan - 2014-10-29 1010    Clinical Impression Statement This patient presents with decreased L UE strength, ROM and functional use of LUE for home activities.  Eric [name] will benefit from skilled PT to restore maximal functional mobility.    Pt will benefit from skilled therapeutic intervention in order to improve on the following deficits Pain;Decreased mobility;Increased fascial restricitons;Decreased strength;Decreased range of motion;Decreased activity tolerance;Impaired UE functional use   Rehab Potential Excellent   PT Frequency 2x / week   PT Duration 8 weeks   PT Treatment/Interventions ADLs/Self Care Home Management;Electrical Stimulation;Therapeutic exercise;Patient/family education;Passive range of motion;Manual techniques;Neuromuscular re-education;Functional mobility training;Cryotherapy;Ultrasound;Therapeutic activities   PT Next Visit Plan check PROM, AAROM advance as tolerated, begin isometrics or scapular pull, ask about balance   PT Home Exercise Plan pendulum, table slides   Consulted and Agree with Plan of Care Patient          G-Codes - 10/29/14 1023    Functional Assessment Tool Used Quick DASH   Functional Limitation Carrying, moving and handling objects   Carrying, Moving and Handling Objects Current Status (V3710) At least 60 percent but less than 80 percent impaired, limited or restricted   Carrying, Moving and Handling Objects Goal Status (G2694) At least 40 percent but less than 60 percent impaired, limited or restricted       Problem List Patient Active Problem List   Diagnosis Date Noted  . Olecranon bursitis 04/23/2014  . Encounter for therapeutic drug  monitoring 09/05/2013  . Preop cardiovascular exam 04/13/2013  . Routine general medical examination at a health care facility 10/10/2012  . PVD (peripheral vascular disease) 06/02/2012  . Diabetes mellitus type 2 with peripheral artery disease 05/17/2012  . Microhematuria 11/16/2011  . BPH (benign prostatic hyperplasia) 11/16/2011  . Chronic kidney disease, stage III (moderate) 08/26/2011  . Leg pain 08/26/2011  . Coronary artery disease 03/25/2011  . Encounter for long-term (current) use of anticoagulants 12/23/2010  . OBSTRUCTIVE SLEEP APNEA 09/15/2010  . PERIODIC LIMB MOVEMENT DISORDER 09/15/2010  . SLEEP APNEA 08/26/2010  . PERSONAL HISTORY OF COLONIC POLYPS 10/25/2009  . VITAMIN B12 DEFICIENCY 03/04/2009  . ATRIAL FIBRILLATION 12/25/2008  . ATRIAL FLUTTER 12/25/2008  . Diabetes mellitus with neuropathy 11/24/2006  . HYPERTRIGLYCERIDEMIA 11/24/2006  . HYPERTENSION 11/24/2006    Detron Carras 10-29-14, 2:21 PM  [facility] [address] [place], [place], [address] Phone: [phone]   Fax:  [phone]

## 2014-10-26 NOTE — Progress Notes (Signed)
HPI: FU atrial fibrillation, coronary artery disease. Patient is status post coronary artery bypassing graft in 2000 with a LIMA to the LAD, saphenous vein graft to the diagonal and saphenous vein graft to the PDA. Cardiac catheterization in 2003 revealed normal left main, occluded LAD after the first diagonal, 80% LAD leading into the diagonal, 50% ramus, 50% obtuse marginal 70% mid PDA and an 80% right coronary artery. The saphenous vein graft to the right coronary and diagonal were occluded. The LIMA to the LAD was patent. LV function was normal. Patient had PCI of the LAD at that time. Patient also with peripheral vascular disease and has had prior angioplasty of his right lower extremity in [place] 2013. He also has permanent atrial fibrillation. Last nuclear study in March of 2015 was not gated. There was a small amount of ischemia in the mid anterior wall. Since he was last seen he denies dyspnea, chest pain, palpitations or syncope.  Current Outpatient Prescriptions  Medication Sig Dispense Refill  . acetaminophen (TYLENOL) 500 MG tablet Take 1,000 mg by mouth every 8 (eight) hours as needed for mild pain.     [name] amLODipine (NORVASC) 5 MG tablet Take 1 tablet (5 mg total) by mouth daily. 90 tablet 3  . atorvastatin (LIPITOR) 40 MG tablet Take 1 tablet (40 mg total) by mouth daily. 90 tablet 3  . B Complex Vitamins (B COMPLEX-B12) TABS Take 1 tablet by mouth daily.     . cholecalciferol (VITAMIN D) 1000 UNITS tablet Take 1,000 Units by mouth 2 (two) times a week. Takes on Mon and Fri    . CINNAMON PO Take by mouth 2 (two) times daily.     . fenofibrate 160 MG tablet Take 1 tablet (160 mg total) by mouth daily. 90 tablet 3  . fish oil-omega-3 fatty acids 1000 MG capsule Take 3 g by mouth daily.     . furosemide (LASIX) 20 MG tablet Take 1 tablet (20 mg total) by mouth daily. 90 tablet 3  . glucose blood test strip Pt uses true test - test strips for true results meter    . lisinopril  (PRINIVIL,ZESTRIL) 40 MG tablet Take 1 tablet (40 mg total) by mouth daily. 90 tablet 3  . metoprolol (LOPRESSOR) 50 MG tablet Take 1.5 tablets (75 mg total) by mouth 2 (two) times daily. 435 tablet 3  . Multiple Vitamin (DAILY MULTIVITAMIN PO) Take 1 tablet by mouth daily.      . traMADol (ULTRAM) 50 MG tablet Take 1 tablet (50 mg total) by mouth every 12 (twelve) hours as needed for moderate pain. 20 tablet 0  . warfarin (COUMADIN) 2 MG tablet Take as directed by coumadin clinic (Patient taking differently: Take 1-2 mg by mouth daily. Takes 2 mg everyday except for Wednesday when patient takes 1 mg) 90 tablet 1   No current facility-administered medications for this visit.     Past Medical History  Diagnosis Date  . CAD (coronary artery disease)     cath 2003, occluded S-RCA, occluded S-Dx, L-LAD ok, s/p PTCA to LAD  . Atrial fibrillation   . Atrial flutter     s/p RFCA  . Long term (current) use of anticoagulants   . Pure hyperglyceridemia   . Unspecified essential hypertension   . Diabetes mellitus   . OSA (obstructive sleep apnea) 12/11    very mild, AHI 7/hr  . PVD (peripheral vascular disease)     angioplasty of his right lower  extremity in [place] by Dr.[name] 2013  . Cataract   . Neuromuscular disorder     Past Surgical History  Procedure Laterality Date  . Nm myoview ltd  4/11    normal  . Angioplasty  1/99    CAD- diogonal with rotational artherectomy  . Carpal tunnel release      ? bilateral  . Cardiac catheterization  1/00  . Arthrectomy      of LAD & PTCA  . Coronary artery bypass graft  2000  . Ptca  4/03    admit unstable angina  . Cardioversion  1/04  . Cardioversion  5/07    hospital- a flutter  . Adenosine myoview  3/06    EF 56%, neg. Ischemia  . Adenosine myoview  02/18/07    nml  . Hand surgery  08/27/09    R thumb procedure wit Scaphoid Gragt and screws, Dr [name]  . Colonoscopy w/ biopsies  10/01/06    sigmoid polyp bx neg, 3 years  . Fracture  surgery      History   Social History  . Marital Status: Married    Spouse Name: N/A  . Number of Children: 5  . Years of Education: N/A   Occupational History  . [employer]    Social History Main Topics  . Smoking status: Never Smoker   . Smokeless tobacco: Former Systems developer  . Alcohol Use: No  . Drug Use: No  . Sexual Activity: No   Other Topics Concern  . Not on file   Social History Narrative   Retired now does some antiques   Retired from Theatre manager and dye work   Married 1951   5 kids    ROS: no fevers or chills, productive cough, hemoptysis, dysphasia, odynophagia, melena, hematochezia, dysuria, hematuria, rash, seizure activity, orthopnea, PND, pedal edema, claudication. Remaining systems are negative.  Physical Exam: Well-developed well-nourished in no acute distress.  Skin is warm and dry.  HEENT is normal.  Neck is supple.  Chest is clear to auscultation with normal expansion.  Cardiovascular exam is irregular Abdominal exam nontender or distended. No masses palpated. Extremities show trace edema. neuro grossly intact  ECG atrial fibrillation at a rate of 51. Nonspecific ST changes.

## 2014-10-29 ENCOUNTER — Other Ambulatory Visit: Payer: Self-pay | Admitting: Family Medicine

## 2014-10-29 ENCOUNTER — Ambulatory Visit (INDEPENDENT_AMBULATORY_CARE_PROVIDER_SITE_OTHER): Payer: PPO | Admitting: Cardiology

## 2014-10-29 ENCOUNTER — Encounter: Payer: Self-pay | Admitting: Cardiology

## 2014-10-29 VITALS — BP 131/70 | HR 51 | Ht 65.0 in | Wt 187.0 lb

## 2014-10-29 DIAGNOSIS — I739 Peripheral vascular disease, unspecified: Secondary | ICD-10-CM | POA: Diagnosis not present

## 2014-10-29 DIAGNOSIS — I4891 Unspecified atrial fibrillation: Secondary | ICD-10-CM

## 2014-10-29 DIAGNOSIS — I10 Essential (primary) hypertension: Secondary | ICD-10-CM | POA: Diagnosis not present

## 2014-10-29 DIAGNOSIS — I2510 Atherosclerotic heart disease of native coronary artery without angina pectoris: Secondary | ICD-10-CM | POA: Diagnosis not present

## 2014-10-29 DIAGNOSIS — E781 Pure hyperglyceridemia: Secondary | ICD-10-CM

## 2014-10-29 DIAGNOSIS — I482 Chronic atrial fibrillation, unspecified: Secondary | ICD-10-CM

## 2014-10-29 MED ORDER — METOPROLOL TARTRATE 50 MG PO TABS: 50.0000 mg | ORAL_TABLET | Freq: Two times a day (BID) | ORAL | Status: DC

## 2014-10-29 NOTE — Telephone Encounter (Signed)
[phone] [facility] [place], [place] - [place] PT is needing a refill on diabetic supplies: He checks his sugar once daily He uses True Result Meter

## 2014-10-29 NOTE — Patient Instructions (Signed)
Your physician wants you to follow-up in: [place] will receive a reminder letter in the mail two months in advance. If you don't receive a letter, please call our office to schedule the follow-up appointment.   DECREASE METOPROLOL TO 50 MG TWICE DAILY

## 2014-10-29 NOTE — Assessment & Plan Note (Signed)
Continue statin. Not on aspirin given need for Coumadin. 

## 2014-10-29 NOTE — Assessment & Plan Note (Signed)
Continue present medications. 

## 2014-10-29 NOTE — Assessment & Plan Note (Signed)
Continue statin. 

## 2014-10-29 NOTE — Assessment & Plan Note (Signed)
Continue remaining blood pressure medications at present dose.

## 2014-10-29 NOTE — Assessment & Plan Note (Addendum)
Patient remains in permanent atrial fibrillation. Heart rate is decreased. Decrease metoprolol to 50 mg by mouth twice a day. Continue Coumadin. Hemoglobin monitored by primary care. Declines NOACs due to expense.

## 2014-10-30 MED ORDER — GLUCOSE BLOOD VI STRP: ORAL_STRIP | Status: DC

## 2014-10-30 NOTE — Telephone Encounter (Signed)
Test strips and lancets sent to pharmacy

## 2014-11-01 ENCOUNTER — Telehealth: Payer: Self-pay | Admitting: Family Medicine

## 2014-11-01 MED ORDER — ONETOUCH ULTRA SYSTEM W/DEVICE KIT: 1.0000 | PACK | Freq: Once | Status: DC

## 2014-11-01 MED ORDER — GLUCOSE BLOOD VI STRP: ORAL_STRIP | Status: DC

## 2014-11-01 MED ORDER — ONETOUCH ULTRASOFT LANCETS MISC: Status: DC

## 2014-11-01 NOTE — Telephone Encounter (Signed)
Rx for diabetic supplies printed and faxed to pharmacy

## 2014-11-05 ENCOUNTER — Ambulatory Visit: Payer: PPO | Admitting: Physical Therapy

## 2014-11-05 ENCOUNTER — Other Ambulatory Visit: Payer: Self-pay | Admitting: Family Medicine

## 2014-11-05 DIAGNOSIS — M25612 Stiffness of left shoulder, not elsewhere classified: Secondary | ICD-10-CM | POA: Diagnosis not present

## 2014-11-05 DIAGNOSIS — M25512 Pain in left shoulder: Secondary | ICD-10-CM

## 2014-11-05 DIAGNOSIS — R29898 Other symptoms and signs involving the musculoskeletal system: Secondary | ICD-10-CM

## 2014-11-05 MED ORDER — ONETOUCH DELICA LANCETS FINE MISC: 1.0000 | Freq: Every day | Status: DC

## 2014-11-05 NOTE — Patient Instructions (Addendum)
EXTENSION: Standing - Resistance Band: Stable (Active)   Stand, right arm at side. Against yellow resistance band, draw arm backward, as far as possible, keeping elbow straight. Complete _2__ sets of _10__ repetitions. Perform __2_ sessions per day.  Copyright  VHI. All rights reserved.  Resistive Band Rowing   With resistive band anchored in door, grasp both ends. Keeping elbows bent, pull back, squeezing shoulder blades together. Hold __5__ seconds. Repeat __10__ times. Do _2___ sessions per day.  http://gt2.exer.us/97   Copyright  VHI. All rights reserved.  SHOULDER: External Rotation - Supine (Cane)   Hold cane with both hands. Rotate arm away from body. Keep elbow on floor and next to body. _10-20__ reps per set, __2_ sets per day, _7__ days per week Add towel to keep elbow at side.  Copyright  VHI. All rights reserved.  Cane Horizontal - Supine   With straight arms holding cane above shoulders, bring cane out to right, center, out to left, and back to above head. Repeat __10-20_ times. Do __2_ times per day.  Copyright  VHI. All rights reserved.  Cane Exercise: Flexion   Lie on back, holding cane above chest. Keeping arms as straight as possible, lower cane toward floor beyond head. Hold __5__ seconds. Repeat _10-20___ times. Do __2__ sessions per day.  http://gt2.exer.us/91   Copyright  VHI. All rights reserved.

## 2014-11-05 NOTE — Therapy (Signed)
[place], [place], [address] Phone: [phone]   Fax:  [phone]  Physical Therapy Treatment  Patient Details  Name: Eric [name]. MRN: [id number] Date of Birth: 07/04/1928 Referring Provider:  [name], MD  Encounter Date: 11/05/2014      PT End of Session - 11/05/14 1153    Visit Number 2   Number of Visits 16   Date for PT Re-Evaluation 12/19/14   PT Start Time 1660   PT Stop Time 1230   PT Time Calculation (min) 45 min      Past Medical History  Diagnosis Date  . CAD (coronary artery disease)     cath 2003, occluded S-RCA, occluded S-Dx, L-LAD ok, s/p PTCA to LAD  . Atrial fibrillation   . Atrial flutter     s/p RFCA  . Long term (current) use of anticoagulants   . Pure hyperglyceridemia   . Unspecified essential hypertension   . Diabetes mellitus   . OSA (obstructive sleep apnea) 12/11    very mild, AHI 7/hr  . PVD (peripheral vascular disease)     angioplasty of his right lower extremity in [place] by Dr.[name] 2013  . Cataract   . Neuromuscular disorder     Past Surgical History  Procedure Laterality Date  . Nm myoview ltd  4/11    normal  . Angioplasty  1/99    CAD- diogonal with rotational artherectomy  . Carpal tunnel release      ? bilateral  . Cardiac catheterization  1/00  . Arthrectomy      of LAD & PTCA  . Coronary artery bypass graft  2000  . Ptca  4/03    admit unstable angina  . Cardioversion  1/04  . Cardioversion  5/07    hospital- a flutter  . Adenosine myoview  3/06    EF 56%, neg. Ischemia  . Adenosine myoview  02/18/07    nml  . Hand surgery  08/27/09    R thumb procedure wit Scaphoid Gragt and screws, Dr [name]  . Colonoscopy w/ biopsies  10/01/06    sigmoid polyp bx neg, 3 years  . Fracture surgery      There were no vitals filed for this visit.  Visit Diagnosis:  Stiffness of shoulder joint, left  Weakness of left upper  extremity  Pain in joint, shoulder region, left      Subjective Assessment - 11/05/14 1153    Currently in Pain? No/denies            [facility] PT Assessment - 11/05/14 1146    ROM / Strength   AROM / PROM / Strength AROM   AROM   AROM Assessment Site Shoulder   Right/Left Shoulder Left   Left Shoulder Flexion 100 Degrees   Left Shoulder ABduction 85 Degrees   Left Shoulder Internal Rotation --  reach to T-12   Left Shoulder External Rotation --  reach to T2   PROM   Left Shoulder Flexion 130 Degrees  supine   Left Shoulder ABduction 125 Degrees  supine   Left Shoulder Internal Rotation 80 Degrees  supine @ 50 degrees abdct   Left Shoulder External Rotation 72 Degrees  @ 45 degrees abdct                   [facility] Adult PT Treatment/Exercise - 11/05/14 1200    Standardized Balance Assessment   Standardized Balance Assessment Berg Balance Test  Berg Balance Test   Sit to Stand Able to stand without using hands and stabilize independently   Standing Unsupported Able to stand safely 2 minutes   Sitting with Back Unsupported but Feet Supported on Floor or Stool Able to sit safely and securely 2 minutes   Stand to Sit Sits safely with minimal use of hands   Transfers Able to transfer safely, minor use of hands   Standing Unsupported with Eyes Closed Able to stand 10 seconds safely   Standing Ubsupported with Feet Together Able to place feet together independently and stand 1 minute safely   From Standing, Reach Forward with Outstretched Arm Can reach confidently >25 cm (10")   From Standing Position, Pick up Object from Floor Able to pick up shoe safely and easily   From Standing Position, Turn to Look Behind Over each Shoulder Looks behind from both sides and weight shifts well   Turn 360 Degrees Able to turn 360 degrees safely in 4 seconds or less   Standing Unsupported, Alternately Place Feet on Step/Stool Able to stand independently and safely and complete 8  steps in 20 seconds   Standing Unsupported, One Foot in Front Able to place foot tandem independently and hold 30 seconds   Standing on One Leg Tries to lift leg/unable to hold 3 seconds but remains standing independently   Total Score 53   Shoulder Exercises: Supine   External Rotation Both;15 reps;Theraband   Other Supine Exercises Supine cane exercises x 20 each  most pulling felt with horiz abdct   Shoulder Exercises: Standing   Extension Both;15 reps   Theraband Level (Shoulder Extension) Level 1 (Yellow)   Row Both;15 reps   Theraband Level (Shoulder Row) Level 1 (Yellow)   Retraction AROM;Both;10 reps   Shoulder Exercises: Pulleys   Flexion 2 minutes   ABduction 1 minute   Shoulder Exercises: ROM/Strengthening   Other ROM/Strengthening Exercises Wall ladder x 5   flexion   Other ROM/Strengthening Exercises Wall slide x 10 flexion                PT Education - 11/05/14 1255    Education provided Yes   Education Details Rows, shoulder extension with yellow band, Supine cane   Person(s) Educated Patient   Methods Explanation;Handout   Comprehension Verbalized understanding          PT Short Term Goals - 11/05/14 1219    PT SHORT TERM GOAL #1   Title Pt will be I with initial HEP for AAROM, strength   Time 4   Period Weeks   Status Achieved   PT SHORT TERM GOAL #2   Title Pt will be able to demo 4/5 strength in LUE flexion and abd in avail. ROM   Time 4   Period Weeks   Status On-going   PT SHORT TERM GOAL #3   Title Pt will be able to complete ADLs with min difficulty   Time 4   Period Weeks   Status Achieved           PT Long Term Goals - 10/24/14 1417    PT LONG TERM GOAL #1   Title Pt will be I with advanced HEP and concepts of RICE, posture    Time 8   Period Weeks   Status New   PT LONG TERM GOAL #2   Title Pt will be able to lift LUE to 120 deg with min trunk compensation for improved functional reach.  Time 4   Period Weeks    Status New   PT LONG TERM GOAL #3   Title Pt will be able to report no pain with UE activities below shoulder height   Time 8   Period Weeks   Status New   PT LONG TERM GOAL #4   Title Pt will be able to reach to shoulder height for lifting light items in the home without lasting pain   Time 8   Period Weeks   Status New               Plan - 11/05/14 1220    Clinical Impression Statement  BERG 53/56. Pt reports he slipped on ice when he fell and broke his arm. He frequently walks with a walking stick. Pt reports no difficulty with dressing/bathing using LUE. He can drive with LUE for 20 minutes at a time before fatigue, not pain. No increased pain with therex today. Pt tolerated AAROM, AAROM and some resisted exercises without increased pain. Added supine cane and scapular band exercises.  Pt reports desire to finish P.T. quickly.    PT Next Visit Plan add rockwood to HEP if pt reports no increased pain after todays treatment        Problem List Patient Active Problem List   Diagnosis Date Noted  . Olecranon bursitis 04/23/2014  . Encounter for therapeutic drug monitoring 09/05/2013  . Preop cardiovascular exam 04/13/2013  . Routine general medical examination at a health care facility 10/10/2012  . PVD (peripheral vascular disease) 06/02/2012  . Diabetes mellitus type 2 with peripheral artery disease 05/17/2012  . Microhematuria 11/16/2011  . BPH (benign prostatic hyperplasia) 11/16/2011  . Chronic kidney disease, stage III (moderate) 08/26/2011  . Leg pain 08/26/2011  . Coronary artery disease 03/25/2011  . Encounter for long-term (current) use of anticoagulants 12/23/2010  . OBSTRUCTIVE SLEEP APNEA 09/15/2010  . PERIODIC LIMB MOVEMENT DISORDER 09/15/2010  . SLEEP APNEA 08/26/2010  . PERSONAL HISTORY OF COLONIC POLYPS 10/25/2009  . VITAMIN B12 DEFICIENCY 03/04/2009  . ATRIAL FIBRILLATION 12/25/2008  . ATRIAL FLUTTER 12/25/2008  . Diabetes mellitus with neuropathy  11/24/2006  . HYPERTRIGLYCERIDEMIA 11/24/2006  . Essential hypertension 11/24/2006    Dorene Ar, PTA 11/05/2014, 12:56 PM  [facility] [address] [place], [place], [address] Phone: [phone]   Fax:  [phone]

## 2014-11-05 NOTE — Telephone Encounter (Signed)
Lancets refilled for correct device

## 2014-11-07 ENCOUNTER — Ambulatory Visit: Payer: PPO | Admitting: Physical Therapy

## 2014-11-08 ENCOUNTER — Ambulatory Visit: Payer: PPO | Admitting: Physical Therapy

## 2014-11-08 DIAGNOSIS — R293 Abnormal posture: Secondary | ICD-10-CM

## 2014-11-08 DIAGNOSIS — R29898 Other symptoms and signs involving the musculoskeletal system: Secondary | ICD-10-CM

## 2014-11-08 DIAGNOSIS — M25612 Stiffness of left shoulder, not elsewhere classified: Secondary | ICD-10-CM

## 2014-11-08 DIAGNOSIS — M25512 Pain in left shoulder: Secondary | ICD-10-CM

## 2014-11-08 NOTE — Patient Instructions (Signed)
Strengthening: Resisted Internal Rotation   Hold tubing in left hand, elbow at side and forearm out. Rotate forearm in across body. Repeat __10__ times per set. Do __1-2__ sets per session. Do __2_ sessions per day.  http://orth.exer.us/830   Copyright  VHI. All rights reserved.  Strengthening: Resisted External Rotation   Hold tubing in right hand, elbow at side and forearm across body. Rotate forearm out. Repeat __10__ times per set. Do __1-2__ sets per session. Do ___2_ sessions per day.  http://orth.exer.us/828   Copyright  VHI. All rights reserved.  Strengthening: Resisted Flexion   Hold tubing with left arm at side. Pull forward and up. Move shoulder through pain-free range of motion. Repeat _10___ times per set. Do 1-2____ sets per session. Do _2__ sessions per day.  http://orth.exer.us/824   Copyright  VHI. All rights reserved.  Strengthening: Resisted Extension   Hold tubing in right hand, arm forward. Pull arm back, elbow straight. Repeat __10__ times per set. Do __1-2__ sets per session. Do _2_ sessions per day.  http://orth.exer.us/832   Copyright  VHI. All rights reserved.

## 2014-11-08 NOTE — Therapy (Signed)
[place], [place], [address] Phone: [phone]   Fax:  [phone]  Physical Therapy Treatment  Patient Details  Name: Eric [name]. MRN: [id number] Date of Birth: January 23, 1928 Referring Provider:  [name], MD  Encounter Date: 11/08/2014      PT End of Session - 11/08/14 1249    Visit Number 3   Number of Visits 16   Date for PT Re-Evaluation 12/19/14   PT Start Time 0933   PT Stop Time 1015   PT Time Calculation (min) 42 min      Past Medical History  Diagnosis Date  . CAD (coronary artery disease)     cath 2003, occluded S-RCA, occluded S-Dx, L-LAD ok, s/p PTCA to LAD  . Atrial fibrillation   . Atrial flutter     s/p RFCA  . Long term (current) use of anticoagulants   . Pure hyperglyceridemia   . Unspecified essential hypertension   . Diabetes mellitus   . OSA (obstructive sleep apnea) 12/11    very mild, AHI 7/hr  . PVD (peripheral vascular disease)     angioplasty of his right lower extremity in [place] by Dr.[name] 2013  . Cataract   . Neuromuscular disorder     Past Surgical History  Procedure Laterality Date  . Nm myoview ltd  4/11    normal  . Angioplasty  1/99    CAD- diogonal with rotational artherectomy  . Carpal tunnel release      ? bilateral  . Cardiac catheterization  1/00  . Arthrectomy      of LAD & PTCA  . Coronary artery bypass graft  2000  . Ptca  4/03    admit unstable angina  . Cardioversion  1/04  . Cardioversion  5/07    hospital- a flutter  . Adenosine myoview  3/06    EF 56%, neg. Ischemia  . Adenosine myoview  02/18/07    nml  . Hand surgery  08/27/09    R thumb procedure wit Scaphoid Gragt and screws, Dr [name]  . Colonoscopy w/ biopsies  10/01/06    sigmoid polyp bx neg, 3 years  . Fracture surgery      There were no vitals filed for this visit.  Visit Diagnosis:  Weakness of left upper extremity  Stiffness of shoulder joint,  left  Pain in joint, shoulder region, left  Poor posture      Subjective Assessment - 11/08/14 1257    Symptoms A little sore after last time   Currently in Pain? No/denies            [facility] PT Assessment - 11/08/14 0001    AROM   Left Shoulder Flexion 108 Degrees   Left Shoulder ABduction 88 Degrees   Left Shoulder Internal Rotation --  reach to T-12   Left Shoulder External Rotation --  reach to back of neck                   [facility] Adult PT Treatment/Exercise - 11/08/14 0001    Shoulder Exercises: Seated   Extension Both;20 reps;Theraband  cues to only pull back to hips   Theraband Level (Shoulder Extension) Level 1 (Yellow)   Row Both;20 reps;Theraband   Theraband Level (Shoulder Row) Level 1 (Yellow)   External Rotation Strengthening;Both;20 reps;Theraband   Theraband Level (Shoulder External Rotation) Other (comment)  beige band   Shoulder Exercises: Standing   External Rotation Left;10 reps  Theraband Level (Shoulder External Rotation) --  beige   Internal Rotation Left;10 reps  beige   Flexion 10 reps  punch   Theraband Level (Shoulder Flexion) --  beige   Other Standing Exercises ball on wall circles in flexion and abduction 1 minute flexion, 30 sec abduction   Shoulder Exercises: Pulleys   Flexion 2 minutes   ABduction 1 minute   Shoulder Exercises: ROM/Strengthening   Other ROM/Strengthening Exercises Wall ladder x 8 each   flexion and scaption   Other ROM/Strengthening Exercises Wall slide x 10 flexion  and abduction                  PT Short Term Goals - 11/05/14 1219    PT SHORT TERM GOAL #1   Title Pt will be I with initial HEP for AAROM, strength   Time 4   Period Weeks   Status Achieved   PT SHORT TERM GOAL #2   Title Pt will be able to demo 4/5 strength in LUE flexion and abd in avail. ROM   Time 4   Period Weeks   Status On-going   PT SHORT TERM GOAL #3   Title Pt will be able to complete ADLs with min  difficulty   Time 4   Period Weeks   Status Achieved           PT Long Term Goals - 10/24/14 1417    PT LONG TERM GOAL #1   Title Pt will be I with advanced HEP and concepts of RICE, posture    Time 8   Period Weeks   Status New   PT LONG TERM GOAL #2   Title Pt will be able to lift LUE to 120 deg with min trunk compensation for improved functional reach.    Time 4   Period Weeks   Status New   PT LONG TERM GOAL #3   Title Pt will be able to report no pain with UE activities below shoulder height   Time 8   Period Weeks   Status New   PT LONG TERM GOAL #4   Title Pt will be able to reach to shoulder height for lifting light items in the home without lasting pain   Time 8   Period Weeks   Status New               Plan - 11/08/14 1250    Clinical Impression Statement Left shoulder flexion and abduction improved. Pt reports increased soreness after last visit. He tolerates therex well in clinic with minimal increase pain. Pt educated to perform HEP without pain increase and to stop if painful. He declines ice after treatment today.   PT Next Visit Plan reprint and review [place] with beige band        Problem List Patient Active Problem List   Diagnosis Date Noted  . Olecranon bursitis 04/23/2014  . Encounter for therapeutic drug monitoring 09/05/2013  . Preop cardiovascular exam 04/13/2013  . Routine general medical examination at a health care facility 10/10/2012  . PVD (peripheral vascular disease) 06/02/2012  . Diabetes mellitus type 2 with peripheral artery disease 05/17/2012  . Microhematuria 11/16/2011  . BPH (benign prostatic hyperplasia) 11/16/2011  . Chronic kidney disease, stage III (moderate) 08/26/2011  . Leg pain 08/26/2011  . Coronary artery disease 03/25/2011  . Encounter for long-term (current) use of anticoagulants 12/23/2010  . OBSTRUCTIVE SLEEP APNEA 09/15/2010  . PERIODIC LIMB MOVEMENT DISORDER 09/15/2010  .  SLEEP APNEA 08/26/2010   . PERSONAL HISTORY OF COLONIC POLYPS 10/25/2009  . VITAMIN B12 DEFICIENCY 03/04/2009  . ATRIAL FIBRILLATION 12/25/2008  . ATRIAL FLUTTER 12/25/2008  . Diabetes mellitus with neuropathy 11/24/2006  . HYPERTRIGLYCERIDEMIA 11/24/2006  . Essential hypertension 11/24/2006    Dorene Ar, PTA 11/08/2014, 12:58 PM  [facility] [address] [place], [place], [address] Phone: [phone]   Fax:  [phone]

## 2014-11-12 ENCOUNTER — Ambulatory Visit: Payer: PPO | Attending: Orthopaedic Surgery | Admitting: Physical Therapy

## 2014-11-12 ENCOUNTER — Encounter: Payer: Self-pay | Admitting: Physical Therapy

## 2014-11-12 DIAGNOSIS — M25512 Pain in left shoulder: Secondary | ICD-10-CM | POA: Diagnosis not present

## 2014-11-12 DIAGNOSIS — R29898 Other symptoms and signs involving the musculoskeletal system: Secondary | ICD-10-CM | POA: Diagnosis not present

## 2014-11-12 DIAGNOSIS — R293 Abnormal posture: Secondary | ICD-10-CM | POA: Insufficient documentation

## 2014-11-12 DIAGNOSIS — M25612 Stiffness of left shoulder, not elsewhere classified: Secondary | ICD-10-CM | POA: Diagnosis not present

## 2014-11-12 NOTE — Patient Instructions (Signed)
Regular HEP! Reprinted full.

## 2014-11-12 NOTE — Therapy (Signed)
[place], [place], [address] Phone: [phone]   Fax:  [phone]  Physical Therapy Treatment  Patient Details  Name: Eric [name]. MRN: [id number] Date of Birth: 26-Mar-1928 Referring Provider:  [name], MD  Encounter Date: 11/12/2014      PT End of Session - 11/12/14 1007    Visit Number 4   Number of Visits 16   Date for PT Re-Evaluation 12/19/14   PT Start Time 0938   PT Stop Time 1027   PT Time Calculation (min) 49 min   Activity Tolerance Patient tolerated treatment well      Past Medical History  Diagnosis Date  . CAD (coronary artery disease)     cath 2003, occluded S-RCA, occluded S-Dx, L-LAD ok, s/p PTCA to LAD  . Atrial fibrillation   . Atrial flutter     s/p RFCA  . Long term (current) use of anticoagulants   . Pure hyperglyceridemia   . Unspecified essential hypertension   . Diabetes mellitus   . OSA (obstructive sleep apnea) 12/11    very mild, AHI 7/hr  . PVD (peripheral vascular disease)     angioplasty of his right lower extremity in [place] by Dr.[name] 2013  . Cataract   . Neuromuscular disorder     Past Surgical History  Procedure Laterality Date  . Nm myoview ltd  4/11    normal  . Angioplasty  1/99    CAD- diogonal with rotational artherectomy  . Carpal tunnel release      ? bilateral  . Cardiac catheterization  1/00  . Arthrectomy      of LAD & PTCA  . Coronary artery bypass graft  2000  . Ptca  4/03    admit unstable angina  . Cardioversion  1/04  . Cardioversion  5/07    hospital- a flutter  . Adenosine myoview  3/06    EF 56%, neg. Ischemia  . Adenosine myoview  02/18/07    nml  . Hand surgery  08/27/09    R thumb procedure wit Scaphoid Gragt and screws, Dr [name]  . Colonoscopy w/ biopsies  10/01/06    sigmoid polyp bx neg, 3 years  . Fracture surgery      There were no vitals filed for this visit.  Visit Diagnosis:  Stiffness of  shoulder joint, left  Weakness of left upper extremity  Pain in joint, shoulder region, left  Poor posture      Subjective Assessment - 11/12/14 0942    Subjective 1/10 today L shoulder. I slept on it.              [facility] Adult PT Treatment/Exercise - 11/12/14 0946    Shoulder Exercises: Supine   Protraction Strengthening;Both;10 reps   Horizontal ABduction AAROM;Both;20 reps   External Rotation AAROM;Both;20 reps   Flexion AAROM;Both;20 reps   Theraband Level (Shoulder Flexion) Other (comment)   Shoulder Flexion Weight (lbs) magic circle add   Shoulder Exercises: Standing   External Rotation Left;20 reps   Theraband Level (Shoulder External Rotation) Level 1 (Yellow)  beige   Internal Rotation Left;20 reps  beige   Theraband Level (Shoulder Internal Rotation) Level 1 (Yellow)   Flexion Strengthening;Left;20 reps   Theraband Level (Shoulder Flexion) Level 1 (Yellow)   ABduction Strengthening;Left;10 reps   Theraband Level (Shoulder ABduction) Level 1 (Yellow)   Shoulder ABduction Weight (lbs) --  min pain    Extension Strengthening;Both;20 reps;Theraband  Theraband Level (Shoulder Extension) Level 1 (Yellow)   Row Strengthening;Both;20 reps;Theraband   Theraband Level (Shoulder Row) Level 2 (Red)   Other Standing Exercises AAROM on wall flexion and scaption   Other Standing Exercises wall push ups x 20 , 2 sets elbows in and elbows out   Shoulder Exercises: Pulleys   Flexion 2 minutes   ABduction 2 minutes   Modalities   Modalities Moist Heat   Moist Heat Therapy   Number Minutes Moist Heat 10 Minutes   Moist Heat Location Shoulder   Manual Therapy   Manual Therapy Passive ROM   Passive ROM ER Flex and Abd to tolerance                PT Education - 11/12/14 1007    Education provided Yes   Education Details HEP review   Person(s) Educated Patient   Methods Explanation;Handout   Comprehension Verbalized understanding;Returned demonstration           PT Short Term Goals - 11/12/14 0959    PT SHORT TERM GOAL #1   Title Pt will be I with initial HEP for AAROM, strength   Status Achieved   PT SHORT TERM GOAL #2   Title Pt will be able to demo 4/5 strength in LUE flexion and abd in avail. ROM   Status On-going   PT SHORT TERM GOAL #3   Title Pt will be able to complete ADLs with min difficulty   Status Achieved           PT Long Term Goals - 11/12/14 0957    PT LONG TERM GOAL #1   Title Pt will be I with advanced HEP and concepts of RICE, posture    Status On-going   PT LONG TERM GOAL #2   Title Pt will be able to lift LUE to 120 deg with min trunk compensation for improved functional reach.    Status On-going   PT LONG TERM GOAL #3   Title Pt will be able to report no pain with UE activities below shoulder height   Status Achieved   PT LONG TERM GOAL #4   Title Pt will be able to reach to shoulder height for lifting light items in the home without lasting pain   Status On-going               Plan - 11/12/14 1016    Clinical Impression Statement Patient tolerates exercises well.  Still weak in all planes approx. 4-/5 throughout. Offered heat today, which felt good to him.         Problem List Patient Active Problem List   Diagnosis Date Noted  . Olecranon bursitis 04/23/2014  . Encounter for therapeutic drug monitoring 09/05/2013  . Preop cardiovascular exam 04/13/2013  . Routine general medical examination at a health care facility 10/10/2012  . PVD (peripheral vascular disease) 06/02/2012  . Diabetes mellitus type 2 with peripheral artery disease 05/17/2012  . Microhematuria 11/16/2011  . BPH (benign prostatic hyperplasia) 11/16/2011  . Chronic kidney disease, stage III (moderate) 08/26/2011  . Leg pain 08/26/2011  . Coronary artery disease 03/25/2011  . Encounter for long-term (current) use of anticoagulants 12/23/2010  . OBSTRUCTIVE SLEEP APNEA 09/15/2010  . PERIODIC LIMB MOVEMENT DISORDER  09/15/2010  . SLEEP APNEA 08/26/2010  . PERSONAL HISTORY OF COLONIC POLYPS 10/25/2009  . VITAMIN B12 DEFICIENCY 03/04/2009  . ATRIAL FIBRILLATION 12/25/2008  . ATRIAL FLUTTER 12/25/2008  . Diabetes mellitus with neuropathy 11/24/2006  .  HYPERTRIGLYCERIDEMIA 11/24/2006  . Essential hypertension 11/24/2006    PAA,JENNIFER 11/12/2014, 12:23 PM  [facility] [address] [place], [place], [address] Phone: [phone]   Fax:  [phone]

## 2014-11-14 ENCOUNTER — Ambulatory Visit: Payer: PPO | Admitting: Physical Therapy

## 2014-11-14 DIAGNOSIS — M25612 Stiffness of left shoulder, not elsewhere classified: Secondary | ICD-10-CM | POA: Diagnosis not present

## 2014-11-14 DIAGNOSIS — R29898 Other symptoms and signs involving the musculoskeletal system: Secondary | ICD-10-CM

## 2014-11-14 DIAGNOSIS — M25512 Pain in left shoulder: Secondary | ICD-10-CM

## 2014-11-14 DIAGNOSIS — R293 Abnormal posture: Secondary | ICD-10-CM

## 2014-11-14 NOTE — Therapy (Signed)
[place], [place], [address] Phone: [phone]   Fax:  [phone]  Physical Therapy Treatment  Patient Details  Name: Eric [name]. MRN: [id number] Date of Birth: Sep 06, 1927 Referring Provider:  [name], MD  Encounter Date: 11/14/2014      PT End of Session - 11/14/14 1040    Visit Number 5   Number of Visits 16   Date for PT Re-Evaluation 12/19/14   PT Start Time 0932   PT Stop Time 1025   PT Time Calculation (min) 53 min   Activity Tolerance Patient tolerated treatment well   Behavior During Therapy [facility] for tasks assessed/performed      Past Medical History  Diagnosis Date  . CAD (coronary artery disease)     cath 2003, occluded S-RCA, occluded S-Dx, L-LAD ok, s/p PTCA to LAD  . Atrial fibrillation   . Atrial flutter     s/p RFCA  . Long term (current) use of anticoagulants   . Pure hyperglyceridemia   . Unspecified essential hypertension   . Diabetes mellitus   . OSA (obstructive sleep apnea) 12/11    very mild, AHI 7/hr  . PVD (peripheral vascular disease)     angioplasty of his right lower extremity in [place] by Dr.[name] 2013  . Cataract   . Neuromuscular disorder     Past Surgical History  Procedure Laterality Date  . Nm myoview ltd  4/11    normal  . Angioplasty  1/99    CAD- diogonal with rotational artherectomy  . Carpal tunnel release      ? bilateral  . Cardiac catheterization  1/00  . Arthrectomy      of LAD & PTCA  . Coronary artery bypass graft  2000  . Ptca  4/03    admit unstable angina  . Cardioversion  1/04  . Cardioversion  5/07    hospital- a flutter  . Adenosine myoview  3/06    EF 56%, neg. Ischemia  . Adenosine myoview  02/18/07    nml  . Hand surgery  08/27/09    R thumb procedure wit Scaphoid Gragt and screws, Dr [name]  . Colonoscopy w/ biopsies  10/01/06    sigmoid polyp bx neg, 3 years  . Fracture surgery      There were no  vitals filed for this visit.  Visit Diagnosis:  Stiffness of shoulder joint, left  Weakness of left upper extremity  Pain in joint, shoulder region, left  Poor posture      Subjective Assessment - 11/14/14 0933    Subjective No pain today. Slept on left shoulder again without increase in pain. Loaded a trailer all by himself, lifting 35- 50 lbs and carrying it for short distance, states it didn't aggrevate his symptoms.   Limitations House hold activities   Currently in Pain? No/denies   Multiple Pain Sites No           [facility] Adult PT Treatment/Exercise - 11/14/14 0938    Shoulder Exercises: Standing   Horizontal ABduction Strengthening;20 reps;Theraband  2 sets   Theraband Level (Shoulder Horizontal ABduction) Level 2 (Red)   External Rotation 15 reps;Strengthening;Left;Theraband  2 sets   Theraband Level (Shoulder External Rotation) Level 4 (Blue)   Internal Rotation Strengthening;Left;15 reps;Theraband   Theraband Level (Shoulder Internal Rotation) Level 4 (Blue)   Flexion Strengthening;Both;20 reps;Theraband  2 sets   Theraband Level (Shoulder Flexion) Level 4 (Blue)   ABduction Strengthening;15 reps;Theraband  2 sets   Theraband Level (Shoulder ABduction) Level 2 (Red)   Extension Strengthening;Both;15 reps;Theraband  2 sets   Theraband Level (Shoulder Extension) Level 4 (Blue)   Row Strengthening;Both;15 reps;Theraband  2 sets   Theraband Level (Shoulder Row) Level 4 (Blue)   Other Standing Exercises biceps curls, 4 lbs, 15 reps, 2 sets   Other Standing Exercises shoulder flexion, elbow xtended, 2 lbs wt, 10 reps, 2 sets, limited ROM   Shoulder Exercises: Pulleys   Flexion 2 minutes   ABduction 2 minutes   Shoulder Exercises: ROM/Strengthening   UBE (Upper Arm Bike) 6 minutes level 1.5, 3 mins frwd 3 mins bckwrds   Rhythmic Stabilization, Supine All planes, shoulder slight abd, elbow 90 deg, wrist neutral, 5 minutes    Other ROM/Strengthening Exercises Wall  ladder abduction 10 reps, flexion 10 reps   Moist Heat Therapy   Number Minutes Moist Heat 10 Minutes   Moist Heat Location Shoulder   Manual Therapy   Manual Therapy Passive ROM   Passive ROM Flex/ER/Abd with humeral distraction for incr range and decr pain                 PT Education - 11/14/14 1040    Education provided Yes   Education Details Lifting precautions, body mechanics, not overdoing it.   Person(s) Educated Patient   Methods Explanation   Comprehension Verbalized understanding          PT Short Term Goals - 11/12/14 0959    PT SHORT TERM GOAL #1   Title Pt will be I with initial HEP for AAROM, strength   Status Achieved   PT SHORT TERM GOAL #2   Title Pt will be able to demo 4/5 strength in LUE flexion and abd in avail. ROM   Status On-going   PT SHORT TERM GOAL #3   Title Pt will be able to complete ADLs with min difficulty   Status Achieved           PT Long Term Goals - 11/12/14 0957    PT LONG TERM GOAL #1   Title Pt will be I with advanced HEP and concepts of RICE, posture    Status On-going   PT LONG TERM GOAL #2   Title Pt will be able to lift LUE to 120 deg with min trunk compensation for improved functional reach.    Status On-going   PT LONG TERM GOAL #3   Title Pt will be able to report no pain with UE activities below shoulder height   Status Achieved   PT LONG TERM GOAL #4   Title Pt will be able to reach to shoulder height for lifting light items in the home without lasting pain   Status On-going          Plan - 11/14/14 1041    Clinical Impression Statement Pt gains strength in LUE, states that it is rarely painful now and is not aggrevated by lifting heavy objects >30lbs. Pt requested more strengthening during the session, which he tolerated well, no pain post ther ex. MHP was applied at the end for comfort and soothing of the musculature.    PT Next Visit Plan Continue strengthening of LUE, review HEP and update for more  strengthening.    PT Home Exercise Plan as previous   Consulted and Agree with Plan of Care Patient        Problem List Patient Active Problem List   Diagnosis Date Noted  .  Olecranon bursitis 04/23/2014  . Encounter for therapeutic drug monitoring 09/05/2013  . Preop cardiovascular exam 04/13/2013  . Routine general medical examination at a health care facility 10/10/2012  . PVD (peripheral vascular disease) 06/02/2012  . Diabetes mellitus type 2 with peripheral artery disease 05/17/2012  . Microhematuria 11/16/2011  . BPH (benign prostatic hyperplasia) 11/16/2011  . Chronic kidney disease, stage III (moderate) 08/26/2011  . Leg pain 08/26/2011  . Coronary artery disease 03/25/2011  . Encounter for long-term (current) use of anticoagulants 12/23/2010  . OBSTRUCTIVE SLEEP APNEA 09/15/2010  . PERIODIC LIMB MOVEMENT DISORDER 09/15/2010  . SLEEP APNEA 08/26/2010  . PERSONAL HISTORY OF COLONIC POLYPS 10/25/2009  . VITAMIN B12 DEFICIENCY 03/04/2009  . ATRIAL FIBRILLATION 12/25/2008  . ATRIAL FLUTTER 12/25/2008  . Diabetes mellitus with neuropathy 11/24/2006  . HYPERTRIGLYCERIDEMIA 11/24/2006  . Essential hypertension 11/24/2006    Ileana Roup 11/14/2014, 10:50 AM  [place] [address] [place], [place], [address] Phone: [phone]   Fax:  [phone]

## 2014-11-20 ENCOUNTER — Ambulatory Visit: Payer: PPO | Admitting: Physical Therapy

## 2014-11-20 DIAGNOSIS — M25612 Stiffness of left shoulder, not elsewhere classified: Secondary | ICD-10-CM | POA: Diagnosis not present

## 2014-11-20 DIAGNOSIS — R29898 Other symptoms and signs involving the musculoskeletal system: Secondary | ICD-10-CM

## 2014-11-20 DIAGNOSIS — R293 Abnormal posture: Secondary | ICD-10-CM

## 2014-11-20 NOTE — Therapy (Signed)
[place], [place], [address] Phone: [phone]   Fax:  [phone]  Physical Therapy Treatment  Patient Details  Name: Eric [name]. MRN: [id number] Date of Birth: [birth year] Referring Provider:  [name], MD  Encounter Date: 11/20/2014      PT End of Session - 11/20/14 1019    Visit Number 6   Number of Visits 16   Date for PT Re-Evaluation 12/19/14   PT Start Time 0933   PT Stop Time 1019   PT Time Calculation (min) 46 min   Activity Tolerance Patient tolerated treatment well      Past Medical History  Diagnosis Date  . CAD (coronary artery disease)     cath 2003, occluded S-RCA, occluded S-Dx, L-LAD ok, s/p PTCA to LAD  . Atrial fibrillation   . Atrial flutter     s/p RFCA  . Long term (current) use of anticoagulants   . Pure hyperglyceridemia   . Unspecified essential hypertension   . Diabetes mellitus   . OSA (obstructive sleep apnea) 12/11    very mild, AHI 7/hr  . PVD (peripheral vascular disease)     angioplasty of his right lower extremity in [place] by Dr.[name] 2013  . Cataract   . Neuromuscular disorder     Past Surgical History  Procedure Laterality Date  . Nm myoview ltd  4/11    normal  . Angioplasty  1/99    CAD- diogonal with rotational artherectomy  . Carpal tunnel release      ? bilateral  . Cardiac catheterization  1/00  . Arthrectomy      of LAD & PTCA  . Coronary artery bypass graft  2000  . Ptca  4/03    admit unstable angina  . Cardioversion  1/04  . Cardioversion  5/07    hospital- a flutter  . Adenosine myoview  3/06    EF 56%, neg. Ischemia  . Adenosine myoview  02/18/07    nml  . Hand surgery  08/27/09    R thumb procedure wit Scaphoid Gragt and screws, Dr [name]  . Colonoscopy w/ biopsies  10/01/06    sigmoid polyp bx neg, 3 years  . Fracture surgery      There were no vitals filed for this visit.  Visit Diagnosis:  Stiffness of  shoulder joint, left  Weakness of left upper extremity  Poor posture      Subjective Assessment - 11/20/14 0937    Subjective No pain.  Able to do what he needs to do around house.  Worse with sleeping on it .     Currently in Pain? No/denies   Pain Score 2    Pain Location Shoulder  Joint line   Pain Orientation Left   Pain Type Chronic pain   Aggravating Factors  Sleeping on shoulder   Pain Relieving Factors Better with exercises.  (Has not taken pain meds in a long time)   Multiple Pain Sites No                       [facility] Adult PT Treatment/Exercise - 11/20/14 0941    Elbow Exercises   Elbow Flexion Left;10 reps;Supine;Bar weights/barbell  4 lbs.flex, Extension 20 reps, PTA holding elbow 4 LBS   Shoulder Exercises: Supine   Other Supine Exercises band exercises (Think osteo porosis course)  10 reps each, narrow, wide,horizontal Diagonal and ER, green   Manual Therapy  Manual Therapy Passive ROM   Passive ROM Flex/ER/Abd with humeral distraction for incr range and decr pain                   PT Short Term Goals - 11/12/14 0959    PT SHORT TERM GOAL #1   Title Pt will be I with initial HEP for AAROM, strength   Status Achieved   PT SHORT TERM GOAL #2   Title Pt will be able to demo 4/5 strength in LUE flexion and abd in avail. ROM   Status On-going   PT SHORT TERM GOAL #3   Title Pt will be able to complete ADLs with min difficulty   Status Achieved           PT Long Term Goals - 11/20/14 1020    PT LONG TERM GOAL #1   Title Pt will be I with advanced HEP and concepts of RICE, posture    Baseline understands, but does not do as often as he should.   Time 8   Period Weeks   Status On-going   PT LONG TERM GOAL #2   Title Pt will be able to lift LUE to 120 deg with min trunk compensation for improved functional reach.    Baseline 120 degree supine   Time 4   Period Weeks   Status On-going   PT LONG TERM GOAL #3   Title Pt will  be able to report no pain with UE activities below shoulder height   Status Achieved   PT LONG TERM GOAL #4   Title Pt will be able to reach to shoulder height for lifting light items in the home without lasting pain   Time 8   Period Weeks   Status Unable to assess               Problem List Patient Active Problem List   Diagnosis Date Noted  . Olecranon bursitis 04/23/2014  . Encounter for therapeutic drug monitoring 09/05/2013  . Preop cardiovascular exam 04/13/2013  . Routine general medical examination at a health care facility 10/10/2012  . PVD (peripheral vascular disease) 06/02/2012  . Diabetes mellitus type 2 with peripheral artery disease 05/17/2012  . Microhematuria 11/16/2011  . BPH (benign prostatic hyperplasia) 11/16/2011  . Chronic kidney disease, stage III (moderate) 08/26/2011  . Leg pain 08/26/2011  . Coronary artery disease 03/25/2011  . Encounter for long-term (current) use of anticoagulants 12/23/2010  . OBSTRUCTIVE SLEEP APNEA 09/15/2010  . PERIODIC LIMB MOVEMENT DISORDER 09/15/2010  . SLEEP APNEA 08/26/2010  . PERSONAL HISTORY OF COLONIC POLYPS 10/25/2009  . VITAMIN B12 DEFICIENCY 03/04/2009  . ATRIAL FIBRILLATION 12/25/2008  . ATRIAL FLUTTER 12/25/2008  . Diabetes mellitus with neuropathy 11/24/2006  . HYPERTRIGLYCERIDEMIA 11/24/2006  . Essential hypertension 11/24/2006    [facility] 11/20/2014, 10:22 AM  [facility] [address] [place], [place], [address] Phone: [phone]   Fax:  [phone]   Melvenia Needles, PTA 11/20/2014 10:22 AM Phone: [phone] Fax: [phone]

## 2014-11-21 ENCOUNTER — Encounter: Payer: Self-pay | Admitting: Physical Therapy

## 2014-11-23 ENCOUNTER — Ambulatory Visit: Payer: PPO | Admitting: Physical Therapy

## 2014-11-23 DIAGNOSIS — R293 Abnormal posture: Secondary | ICD-10-CM

## 2014-11-23 DIAGNOSIS — M25612 Stiffness of left shoulder, not elsewhere classified: Secondary | ICD-10-CM

## 2014-11-23 DIAGNOSIS — R29898 Other symptoms and signs involving the musculoskeletal system: Secondary | ICD-10-CM

## 2014-11-23 DIAGNOSIS — M25512 Pain in left shoulder: Secondary | ICD-10-CM

## 2014-11-23 NOTE — Therapy (Signed)
[place], [place], [address] Phone: [phone]   Fax:  [phone]  Physical Therapy Treatment  Patient Details  Name: Eric [name]. MRN: [id number] Date of Birth: 05/27/1928 Referring Provider:  [name], MD  Encounter Date: 11/23/2014      PT End of Session - 11/23/14 1025    Visit Number 7   Number of Visits 16   Date for PT Re-Evaluation 12/19/14   PT Start Time 0932   PT Stop Time 2878   PT Time Calculation (min) 63 min   Activity Tolerance Patient tolerated treatment well;No increased pain   Behavior During Therapy [facility] for tasks assessed/performed      Past Medical History  Diagnosis Date  . CAD (coronary artery disease)     cath 2003, occluded S-RCA, occluded S-Dx, L-LAD ok, s/p PTCA to LAD  . Atrial fibrillation   . Atrial flutter     s/p RFCA  . Long term (current) use of anticoagulants   . Pure hyperglyceridemia   . Unspecified essential hypertension   . Diabetes mellitus   . OSA (obstructive sleep apnea) 12/11    very mild, AHI 7/hr  . PVD (peripheral vascular disease)     angioplasty of his right lower extremity in [place] by Dr.[name] 2013  . Cataract   . Neuromuscular disorder     Past Surgical History  Procedure Laterality Date  . Nm myoview ltd  4/11    normal  . Angioplasty  1/99    CAD- diogonal with rotational artherectomy  . Carpal tunnel release      ? bilateral  . Cardiac catheterization  1/00  . Arthrectomy      of LAD & PTCA  . Coronary artery bypass graft  2000  . Ptca  4/03    admit unstable angina  . Cardioversion  1/04  . Cardioversion  5/07    hospital- a flutter  . Adenosine myoview  3/06    EF 56%, neg. Ischemia  . Adenosine myoview  02/18/07    nml  . Hand surgery  08/27/09    R thumb procedure wit Scaphoid Gragt and screws, Dr [name]  . Colonoscopy w/ biopsies  10/01/06    sigmoid polyp bx neg, 3 years  . Fracture surgery       There were no vitals filed for this visit.  Visit Diagnosis:  Weakness of left upper extremity  Stiffness of shoulder joint, left  Poor posture  Pain in joint, shoulder region, left      Subjective Assessment - 11/23/14 0952    Subjective Pt is in pain today because was lifting 5 ft 60-70 lbs cedar chest by himself. He thinks he strained a muscles in his left shoulder. Agreed to exercise today and states that it does not hurt to the point that he needs to see a doctor.       Currently in Pain? Yes   Pain Score 5    Pain Location Shoulder   Pain Orientation Left   Pain Descriptors / Indicators Aching;Sore;Tightness   Pain Type Chronic pain   Multiple Pain Sites No          [facility] Adult PT Treatment/Exercise - 11/23/14 0957    Shoulder Exercises: Seated   Row Strengthening;Both;20 reps;Theraband  2 sets, green   Internal Rotation Strengthening;Left;10 reps   Other Seated Exercises biceps curl with 3 lb wt x 30 reps, didn't want to stop  Shoulder Exercises: Standing   Horizontal ABduction Strengthening;Both;10 reps;Theraband  red, 2 sets   External Rotation Strengthening;Both;Theraband;15 reps  green   ABduction Left;15 reps;Strengthening;Theraband  red   Retraction Strengthening;Both;15 reps  2 sets   Shoulder Exercises: ROM/Strengthening   UBE (Upper Arm Bike) 7 minutes, level 2   Shoulder Exercises: Stretch   Wall Stretch - Flexion 5 reps;10 seconds  3 sets   Wall Stretch - ABduction 5 reps;10 seconds  3 sets   Moist Heat Therapy   Number Minutes Moist Heat 15 Minutes   Moist Heat Location Shoulder   Manual Therapy   Manual Therapy Passive ROM   Passive ROM scapular mobilization           PT Education - 11/23/14 1332    Education provided Yes   Education Details posture, body mechanics, and on danger of lifting heavy weights   Person(s) Educated Patient   Methods Explanation   Comprehension Verbalized understanding          PT Short Term  Goals - 11/23/14 1022    PT SHORT TERM GOAL #1   Title Pt will be I with initial HEP for AAROM, strength   Status Achieved   PT SHORT TERM GOAL #2   Title Pt will be able to demo 4/5 strength in LUE flexion and abd in avail. ROM   Status On-going   PT SHORT TERM GOAL #3   Title Pt will be able to complete ADLs with min difficulty   Status Achieved           PT Long Term Goals - 11/23/14 1023    PT LONG TERM GOAL #1   Title Pt will be I with advanced HEP and concepts of RICE, posture    Status On-going   PT LONG TERM GOAL #2   Title Pt will be able to lift LUE to 120 deg with min trunk compensation for improved functional reach.    Status On-going   PT LONG TERM GOAL #3   Title Pt will be able to report no pain with UE activities below shoulder height   Status Achieved   PT LONG TERM GOAL #4   Title Pt will be able to reach to shoulder height for lifting light items in the home without lasting pain   Status Achieved          Plan - 11/23/14 1334    Clinical Impression Statement Mr. Bevill is ready to be DC next week, agreed to be seen 2 more visits. He states he has minimal pain and is doing evrything functionally he needs to do around the house. Had increased pain today due to heavy lifting yesterday, pt was educated on necessity of protecting the healing shoulder. Tolerated treatment well without increase in pain.     PT Next Visit Plan Cont srengthening shoulder and scapular mm. Finalize home exercise? ( Patient's request)     Consulted and Agree with Plan of Care Patient        Problem List Patient Active Problem List   Diagnosis Date Noted  . Olecranon bursitis 04/23/2014  . Encounter for therapeutic drug monitoring 09/05/2013  . Preop cardiovascular exam 04/13/2013  . Routine general medical examination at a health care facility 10/10/2012  . PVD (peripheral vascular disease) 06/02/2012  . Diabetes mellitus type 2 with peripheral artery disease 05/17/2012  .  Microhematuria 11/16/2011  . BPH (benign prostatic hyperplasia) 11/16/2011  . Chronic kidney disease, stage III (moderate) 08/26/2011  .  Leg pain 08/26/2011  . Coronary artery disease 03/25/2011  . Long term (current) use of anticoagulants 12/23/2010  . OBSTRUCTIVE SLEEP APNEA 09/15/2010  . PERIODIC LIMB MOVEMENT DISORDER 09/15/2010  . SLEEP APNEA 08/26/2010  . PERSONAL HISTORY OF COLONIC POLYPS 10/25/2009  . VITAMIN B12 DEFICIENCY 03/04/2009  . ATRIAL FIBRILLATION 12/25/2008  . ATRIAL FLUTTER 12/25/2008  . Diabetes mellitus with neuropathy 11/24/2006  . HYPERTRIGLYCERIDEMIA 11/24/2006  . Essential hypertension 11/24/2006    Ileana Roup 11/23/2014, 1:42 PM  [facility] [address] [place], [place], [address] Phone: [phone]   Fax:  [phone]

## 2014-11-26 ENCOUNTER — Ambulatory Visit (INDEPENDENT_AMBULATORY_CARE_PROVIDER_SITE_OTHER): Payer: PPO | Admitting: *Deleted

## 2014-11-26 ENCOUNTER — Ambulatory Visit: Payer: PPO | Admitting: Physical Therapy

## 2014-11-26 DIAGNOSIS — R29898 Other symptoms and signs involving the musculoskeletal system: Secondary | ICD-10-CM

## 2014-11-26 DIAGNOSIS — I482 Chronic atrial fibrillation, unspecified: Secondary | ICD-10-CM

## 2014-11-26 DIAGNOSIS — Z5181 Encounter for therapeutic drug level monitoring: Secondary | ICD-10-CM

## 2014-11-26 DIAGNOSIS — M25612 Stiffness of left shoulder, not elsewhere classified: Secondary | ICD-10-CM

## 2014-11-26 DIAGNOSIS — Z7901 Long term (current) use of anticoagulants: Secondary | ICD-10-CM

## 2014-11-26 DIAGNOSIS — R293 Abnormal posture: Secondary | ICD-10-CM

## 2014-11-26 DIAGNOSIS — I4892 Unspecified atrial flutter: Secondary | ICD-10-CM | POA: Diagnosis not present

## 2014-11-26 DIAGNOSIS — I4891 Unspecified atrial fibrillation: Secondary | ICD-10-CM | POA: Diagnosis not present

## 2014-11-26 DIAGNOSIS — M25512 Pain in left shoulder: Secondary | ICD-10-CM

## 2014-11-26 LAB — POCT INR: INR: 2.5

## 2014-11-26 NOTE — Therapy (Signed)
[place], [place], [address] Phone: [phone]   Fax:  [phone]  Physical Therapy Treatment  Patient Details  Name: Eric [name]. MRN: [id number] Date of Birth: 1928-02-05 Referring Provider:  [name], MD  Encounter Date: 11/26/2014      PT End of Session - 11/26/14 1344    Visit Number 8   Number of Visits 16   Date for PT Re-Evaluation 12/19/14   PT Start Time 0800   PT Stop Time 0845   PT Time Calculation (min) 45 min   Activity Tolerance Patient tolerated treatment well;No increased pain   Behavior During Therapy [facility] for tasks assessed/performed      Past Medical History  Diagnosis Date  . CAD (coronary artery disease)     cath 2003, occluded S-RCA, occluded S-Dx, L-LAD ok, s/p PTCA to LAD  . Atrial fibrillation   . Atrial flutter     s/p RFCA  . Long term (current) use of anticoagulants   . Pure hyperglyceridemia   . Unspecified essential hypertension   . Diabetes mellitus   . OSA (obstructive sleep apnea) 12/11    very mild, AHI 7/hr  . PVD (peripheral vascular disease)     angioplasty of his right lower extremity in [place] by Dr.[name] 2013  . Cataract   . Neuromuscular disorder     Past Surgical History  Procedure Laterality Date  . Nm myoview ltd  4/11    normal  . Angioplasty  1/99    CAD- diogonal with rotational artherectomy  . Carpal tunnel release      ? bilateral  . Cardiac catheterization  1/00  . Arthrectomy      of LAD & PTCA  . Coronary artery bypass graft  2000  . Ptca  4/03    admit unstable angina  . Cardioversion  1/04  . Cardioversion  5/07    hospital- a flutter  . Adenosine myoview  3/06    EF 56%, neg. Ischemia  . Adenosine myoview  02/18/07    nml  . Hand surgery  08/27/09    R thumb procedure wit Scaphoid Gragt and screws, Dr [name]  . Colonoscopy w/ biopsies  10/01/06    sigmoid polyp bx neg, 3 years  . Fracture surgery       There were no vitals filed for this visit.  Visit Diagnosis:  Weakness of left upper extremity  Stiffness of shoulder joint, left  Poor posture  Pain in joint, shoulder region, left      Subjective Assessment - 11/26/14 0807    Subjective Pt describes his pain as stiffness in L shoulder at about 3/10. He states he does his HEP at home and also lifts and carries light objects as neded. He is ready to be D/C next visit, uses his L UE for all functional activities at home.    Patient Stated Goals Pt achieved his goals   Currently in Pain? Yes   Pain Score 3    Pain Location Shoulder   Pain Orientation Left   Pain Descriptors / Indicators Tightness   Pain Type Chronic pain   Multiple Pain Sites No            [facility] PT Assessment - 11/26/14 0001    AROM   Left Shoulder Flexion 120 Degrees  supine   PROM   Left Shoulder Flexion 140 Degrees  supine   Left Shoulder ABduction 135 Degrees  supine  [facility] Adult PT Treatment/Exercise - 11/26/14 0815    Shoulder Exercises: Standing   Horizontal ABduction Strengthening;Both;15 reps;Theraband   Theraband Level (Shoulder Horizontal ABduction) Level 2 (Red)   External Rotation Both;15 reps;Theraband  2 sets, green   Theraband Level (Shoulder External Rotation) Level 3 (Green)   Internal Rotation Strengthening;Right;15 reps;Theraband  2 sets   Theraband Level (Shoulder Internal Rotation) Level 4 (Blue)   Flexion Both;20 reps;Theraband  2 sets   Theraband Level (Shoulder Flexion) Level 3 (Green)   Extension Strengthening;Both;15 reps;Theraband   Theraband Level (Shoulder Extension) Level 3 (Green)   Row Strengthening;15 reps;Theraband   Theraband Level (Shoulder Row) Level 3 (Green)   Shoulder Exercises: Pulleys   Flexion 2 minutes   ABduction 2 minutes   Manual Therapy   Manual Therapy Passive ROM  contract/relax into ER/Abd/flex   Passive ROM flex/ad/ER           PT Education - 11/26/14 1342     Education provided Yes   Education Details HEP progression, posture   Person(s) Educated Patient   Methods Explanation   Comprehension Verbalized understanding          PT Short Term Goals - 11/23/14 1022    PT SHORT TERM GOAL #1   Title Pt will be I with initial HEP for AAROM, strength   Status Achieved   PT SHORT TERM GOAL #2   Title Pt will be able to demo 4/5 strength in LUE flexion and abd in avail. ROM   Status On-going   PT SHORT TERM GOAL #3   Title Pt will be able to complete ADLs with min difficulty   Status Achieved           PT Long Term Goals - 11/23/14 1023    PT LONG TERM GOAL #1   Title Pt will be I with advanced HEP and concepts of RICE, posture    Status On-going   PT LONG TERM GOAL #2   Title Pt will be able to lift LUE to 120 deg with min trunk compensation for improved functional reach.    Status On-going   PT LONG TERM GOAL #3   Title Pt will be able to report no pain with UE activities below shoulder height   Status Achieved   PT LONG TERM GOAL #4   Title Pt will be able to reach to shoulder height for lifting light items in the home without lasting pain   Status Achieved           Plan - 11/26/14 1344    Clinical Impression Statement Eric [name] is progressing in L shoulder strength and AROM, he uses LLE in all daily activities and doesn't feel like he has limitations usenh left shoulder. Discussed HEP tofay an dprogression at home, he agreed to be D/C next visit.      PT Next Visit Plan Go over HEP, print couple more exercises to cont strengthening at home, give blue thera band, D/C, fill out quick DASH, update goals   PT Home Exercise Plan as previous   Consulted and Agree with Plan of Care Patient        Problem List Patient Active Problem List   Diagnosis Date Noted  . Olecranon bursitis 04/23/2014  . Encounter for therapeutic drug monitoring 09/05/2013  . Preop cardiovascular exam 04/13/2013  . Routine general medical  examination at a health care facility 10/10/2012  . PVD (peripheral vascular disease) 06/02/2012  . Diabetes mellitus type 2 with peripheral  artery disease 05/17/2012  . Microhematuria 11/16/2011  . BPH (benign prostatic hyperplasia) 11/16/2011  . Chronic kidney disease, stage III (moderate) 08/26/2011  . Leg pain 08/26/2011  . Coronary artery disease 03/25/2011  . Long term (current) use of anticoagulants 12/23/2010  . OBSTRUCTIVE SLEEP APNEA 09/15/2010  . PERIODIC LIMB MOVEMENT DISORDER 09/15/2010  . SLEEP APNEA 08/26/2010  . PERSONAL HISTORY OF COLONIC POLYPS 10/25/2009  . VITAMIN B12 DEFICIENCY 03/04/2009  . ATRIAL FIBRILLATION 12/25/2008  . ATRIAL FLUTTER 12/25/2008  . Diabetes mellitus with neuropathy 11/24/2006  . HYPERTRIGLYCERIDEMIA 11/24/2006  . Essential hypertension 11/24/2006    Ileana Roup 11/26/2014, 1:51 PM  [facility] [address] [place], [place], [address] Phone: [phone]   Fax:  [phone]

## 2014-11-27 NOTE — Op Note (Signed)
PATIENT NAME:  Eric [name], HOFFMANN MR#:  [id number] DATE OF BIRTH:  10-11-1927  DATE OF PROCEDURE:  04/28/2012  PREOPERATIVE DIAGNOSES:  1. Peripheral arterial disease with ulceration, right lower extremity.  2. Claudication right lower extremity.  3. Hypertension.  4. Chronic kidney disease.   POSTOPERATIVE DIAGNOSES:  1. Peripheral arterial disease with ulceration, right lower extremity.  2. Claudication right lower extremity.  3. Hypertension.  4. Chronic kidney disease.   PROCEDURE: 1. Ultrasound guidance for vascular access, left femoral artery.  2. Catheter placement into right posterior tibial artery from left femoral approach.  3. Aortogram and selective right lower extremity angiogram.  4. Percutaneous transluminal angioplasty of right tibioperoneal trunk and popliteal artery with 4-mm diameter angioplasty balloon.  5. Percutaneous transluminal angioplasty of right popliteal artery with 5-mm diameter angioplasty balloon.  6. StarClose closure device, left femoral artery.   SURGEON: Leotis Pain, M.D.   ANESTHESIA: Local with moderate conscious sedation.   ESTIMATED BLOOD LOSS: Approximately 25 mL.  CONTRAST USED: 62 mL of Visipaque.   INDICATION FOR PROCEDURE: This is an 81 year old male with nonhealing ulcer of the right lower extremity as well as pain with activity. He had noninvasive studies showing significant reduced perfusion on the right. Intervention is performed for limb salvage. Risks and benefits were discussed.   DESCRIPTION OF PROCEDURE: The patient was brought to the vascular and interventional radiology suite. Groins were shaved and prepped and a sterile surgical field was created. The left femoral head was localized with fluoroscopy and the left femoral artery was accessed without difficulty with a Seldinger needle under direct ultrasound guidance. A permanent image was recorded. A J-wire and 5 French sheath were placed. Pigtail catheter was placed into the aorta at  the L1 level and AP aortogram was performed. This showed single renal arteries bilaterally which were widely patent. The aortoiliac segments were widely patent. I then hooked the aortic bifurcation and advanced to the right femoral head and selective right lower extremity angiogram was then performed. This showed no significant stenosis in the common femoral artery, profunda femoris artery, or superficial femoral artery. The popliteal artery just above the knee occluded and this occluded down through the tibioperoneal trunk with reconstitution into the posterior tibial artery as the best runoff distally. At the level of the ankle the posterior tibial artery actually went through collateral vessels into the anterior tibial artery into the foot. The anterior tibial artery had a flush occlusion proximally. The patient was systemically heparinized with 4000 units intravenous heparin for systemic anticoagulation and a 6 French high flex Ansel sheath was placed over a Terumo advantage wire. I then across the lesion without difficulty with the Terumo advantage wire and Kumpe catheter, confirming intraluminal flow in the posterior tibial artery. I then replaced the wire and treated the tibioperoneal trunk down to the origin of the posterior tibial artery and up into the popliteal artery with 4-mm diameter angioplasty balloon. Following this there was still residual waist at the level of the knee and a 5-mm diameter angioplasty balloon was inflated in the popliteal artery only and not in the tibioperoneal trunk. Completion angiogram following this showed markedly improved flow with a non-flow-limiting stenosis at the level of the knee in the 20% to 30% range, and maintained perfusion distally. At this point I elected to terminate the procedure. The sheath was pulled back to the ipsilateral external iliac artery and oblique arteriogram was performed. StarClose closure device was deployed in the usual fashion with excellent  hemostatic  result. The patient tolerated the procedure well and was taken to the recovery room in stable condition.    ____________________________ Algernon [name], MD [id number] D: 04/28/2012 08:57:01 ET T: 04/28/2012 13:49:40 ET JOB#: [id number]  cc: Algernon [name], MD, <Dictator> Elveria Rising. Damita Dunnings, MD Algernon [name] MD ELECTRONICALLY SIGNED 05/05/2012 13:42

## 2014-11-28 ENCOUNTER — Ambulatory Visit: Payer: PPO | Admitting: Physical Therapy

## 2014-11-28 DIAGNOSIS — R29898 Other symptoms and signs involving the musculoskeletal system: Secondary | ICD-10-CM

## 2014-11-28 DIAGNOSIS — M25512 Pain in left shoulder: Secondary | ICD-10-CM

## 2014-11-28 DIAGNOSIS — M25612 Stiffness of left shoulder, not elsewhere classified: Secondary | ICD-10-CM

## 2014-11-28 DIAGNOSIS — R293 Abnormal posture: Secondary | ICD-10-CM

## 2014-11-28 NOTE — Therapy (Signed)
[place], [place], [address] Phone: [phone]   Fax:  [phone]  Physical Therapy Evaluation / Discharge  Patient Details  Name: Eric [name]. MRN: [id number] Date of Birth: 08/14/1927 Referring Provider:  [name], MD  Encounter Date: 11/28/2014      PT End of Session - 11/28/14 1038    Visit Number 9   Number of Visits 9  DC today   Date for PT Re-Evaluation 12/19/14   PT Start Time 0930   PT Stop Time 6226   PT Time Calculation (min) 62 min   Activity Tolerance Patient tolerated treatment well;No increased pain   Behavior During Therapy [facility] for tasks assessed/performed      Past Medical History  Diagnosis Date  . CAD (coronary artery disease)     cath 2003, occluded S-RCA, occluded S-Dx, L-LAD ok, s/p PTCA to LAD  . Atrial fibrillation   . Atrial flutter     s/p RFCA  . Long term (current) use of anticoagulants   . Pure hyperglyceridemia   . Unspecified essential hypertension   . Diabetes mellitus   . OSA (obstructive sleep apnea) 12/11    very mild, AHI 7/hr  . PVD (peripheral vascular disease)     angioplasty of his right lower extremity in [place] by Eric [name] 2013  . Cataract   . Neuromuscular disorder     Past Surgical History  Procedure Laterality Date  . Nm myoview ltd  4/11    normal  . Angioplasty  1/99    CAD- diogonal with rotational artherectomy  . Carpal tunnel release      ? bilateral  . Cardiac catheterization  1/00  . Arthrectomy      of LAD & PTCA  . Coronary artery bypass graft  2000  . Ptca  4/03    admit unstable angina  . Cardioversion  1/04  . Cardioversion  5/07    hospital- a flutter  . Adenosine myoview  3/06    EF 56%, neg. Ischemia  . Adenosine myoview  02/18/07    nml  . Hand surgery  08/27/09    R thumb procedure wit Scaphoid Gragt and screws, Eric [name]  . Colonoscopy w/ biopsies  10/01/06    sigmoid polyp bx neg, 3 years  .  Fracture surgery      There were no vitals filed for this visit.  Visit Diagnosis:  Weakness of left upper extremity  Stiffness of shoulder joint, left  Poor posture  Pain in joint, shoulder region, left      Subjective Assessment - 11/28/14 1019    Subjective Eric [name] is in today for his last visit, he has no pain, and continues to use his LLE functionally including lifting and carrying objects, reaching to top shelves and cabinets, and peforming ADLs.      Limitations --  None   Patient Stated Goals Pt achieved his goals   Currently in Pain? No/denies          [facility] PT Assessment - 11/28/14 1024    Strength   Right Shoulder Flexion 4/5   Left Shoulder Flexion 4-/5   Left Shoulder ABduction 4-/5   Left Shoulder Internal Rotation 4/5   Left Shoulder External Rotation 4/5   Left Elbow Flexion 4+/5          [facility] Adult PT Treatment/Exercise - 11/28/14 1029    Shoulder Exercises: Supine   Horizontal ABduction AAROM;Left;20 reps;Other (comment)  cane   External Rotation AAROM;Left;20 reps;Other (comment)  cane   Flexion AAROM;Left;20 reps;Other (comment)  cane   ABduction AAROM;Left;20 reps;Other (comment)  cane   Shoulder Exercises: Standing   Horizontal ABduction Strengthening;Both;20 reps;Theraband   Theraband Level (Shoulder Horizontal ABduction) Level 4 (Blue)   External Rotation Strengthening;Left;20 reps;Theraband   Theraband Level (Shoulder External Rotation) Level 4 (Blue)   Internal Rotation Strengthening;Left;20 reps;Theraband   Theraband Level (Shoulder Internal Rotation) Level 4 (Blue)   Flexion Strengthening;Both;20 reps;Theraband   Theraband Level (Shoulder Flexion) Level 2 (Red)   Extension Strengthening;Both;20 reps;Theraband   Theraband Level (Shoulder Extension) Level 3 (Green)   Row Strengthening;Both;20 reps;Theraband   Theraband Level (Shoulder Row) Level 4 (Blue)   Shoulder Exercises: ROM/Strengthening   UBE (Upper Arm Bike) 6 minutes,  level 2   Modalities   Modalities Moist Heat   Moist Heat Therapy   Number Minutes Moist Heat 15 Minutes   Moist Heat Location Shoulder  Left   Manual Therapy   Manual Therapy Joint mobilization;Passive ROM   Joint Mobilization L GH joint AP grades 3-4 for incr flex, distraction grades 3-4 for abd   Passive ROM flex/abd/ER          PT Short Term Goals - 11/28/14 1037    PT SHORT TERM GOAL #1   Title Pt will be I with initial HEP for AAROM, strength   Status Achieved   PT SHORT TERM GOAL #2   Title Pt will be able to demo 4/5 strength in LUE flexion and abd in avail. ROM   Status Partially Met   PT SHORT TERM GOAL #3   Title Pt will be able to complete ADLs with min difficulty   Status Achieved           PT Long Term Goals - 11/28/14 1038    PT LONG TERM GOAL #1   Title Pt will be I with advanced HEP and concepts of RICE, posture    Status Achieved   PT LONG TERM GOAL #2   Title Pt will be able to lift LUE to 120 deg with min trunk compensation for improved functional reach.    Status Achieved   PT LONG TERM GOAL #3   Title Pt will be able to report no pain with UE activities below shoulder height   Status Achieved   PT LONG TERM GOAL #4   Title Pt will be able to reach to shoulder height for lifting light items in the home without lasting pain   Status Achieved           Plan - 11/28/14 1039    Clinical Impression Statement Eric [name] is being D/C today, he agrees to being DC as he met almost all of his goals and doesn't feel the need to continue therapy. He partially acieved strength goal (4-/5) into L shoulder flexion and abd. He functions without feeling any limitations in LUE or pain.  We reviewed all his HEP, including stretches and talked about ways to progress them. Eric [name] was made aware that he can contact our clinic if he needs to talk to a therapist.    PT Next [place], reviewed all today   Consulted and Agree  with Plan of Care Patient      Problem List Patient Active Problem List   Diagnosis Date Noted  . Olecranon bursitis 04/23/2014  . Encounter for therapeutic drug monitoring 09/05/2013  . Preop cardiovascular  exam 04/13/2013  . Routine general medical examination at a health care facility 10/10/2012  . PVD (peripheral vascular disease) 06/02/2012  . Diabetes mellitus type 2 with peripheral artery disease 05/17/2012  . Microhematuria 11/16/2011  . BPH (benign prostatic hyperplasia) 11/16/2011  . Chronic kidney disease, stage III (moderate) 08/26/2011  . Leg pain 08/26/2011  . Coronary artery disease 03/25/2011  . Long term (current) use of anticoagulants 12/23/2010  . OBSTRUCTIVE SLEEP APNEA 09/15/2010  . PERIODIC LIMB MOVEMENT DISORDER 09/15/2010  . SLEEP APNEA 08/26/2010  . PERSONAL HISTORY OF COLONIC POLYPS 10/25/2009  . VITAMIN B12 DEFICIENCY 03/04/2009  . ATRIAL FIBRILLATION 12/25/2008  . ATRIAL FLUTTER 12/25/2008  . Diabetes mellitus with neuropathy 12-09-06  . HYPERTRIGLYCERIDEMIA Dec 09, 2006  . Essential hypertension 12/09/06       G-Codes - Dec 13, 2014 1304    Functional Assessment Tool Used clinical judgement   Functional Limitation Carrying, moving and handling objects   Carrying, Moving and Handling Objects Current Status [phone]) At least 40 percent but less than 60 percent impaired, limited or restricted   Carrying, Moving and Handling Objects Goal Status (X0123) At least 40 percent but less than 60 percent impaired, limited or restricted   Carrying, Moving and Handling Objects Discharge Status [phone]) At least 40 percent but less than 60 percent impaired, limited or restricted        PHYSICAL THERAPY DISCHARGE SUMMARY  Visits from Start of Care: 9  Current functional level related to goals / functional outcomes: Met most goals, see above   Remaining deficits: LUE strength 4-/5 (goal 4/5)   Education / Equipment: HEP progression, use caution with heavy  lifting  Plan: Patient agrees to discharge.  Patient goals were partially met. Patient is being discharged due to being pleased with the current functional level.  ?????        Ileana Roup 12/13/2014, 1:07 PM  [facility] [address] [place], [place], [address] Phone: [phone]   Fax:  [phone]

## 2014-12-07 ENCOUNTER — Other Ambulatory Visit: Payer: Self-pay | Admitting: Physical Therapy

## 2014-12-07 DIAGNOSIS — R29898 Other symptoms and signs involving the musculoskeletal system: Secondary | ICD-10-CM

## 2014-12-07 DIAGNOSIS — M25512 Pain in left shoulder: Secondary | ICD-10-CM

## 2014-12-07 DIAGNOSIS — R293 Abnormal posture: Secondary | ICD-10-CM

## 2014-12-07 DIAGNOSIS — M25612 Stiffness of left shoulder, not elsewhere classified: Secondary | ICD-10-CM

## 2014-12-10 LAB — HM DIABETES EYE EXAM

## 2015-01-03 ENCOUNTER — Encounter: Payer: Self-pay | Admitting: Family Medicine

## 2015-01-08 ENCOUNTER — Ambulatory Visit (INDEPENDENT_AMBULATORY_CARE_PROVIDER_SITE_OTHER): Payer: PPO | Admitting: *Deleted

## 2015-01-08 DIAGNOSIS — I4892 Unspecified atrial flutter: Secondary | ICD-10-CM | POA: Diagnosis not present

## 2015-01-08 DIAGNOSIS — Z7901 Long term (current) use of anticoagulants: Secondary | ICD-10-CM

## 2015-01-08 DIAGNOSIS — I482 Chronic atrial fibrillation, unspecified: Secondary | ICD-10-CM

## 2015-01-08 DIAGNOSIS — Z5181 Encounter for therapeutic drug level monitoring: Secondary | ICD-10-CM

## 2015-01-08 DIAGNOSIS — I4891 Unspecified atrial fibrillation: Secondary | ICD-10-CM

## 2015-01-08 LAB — POCT INR: INR: 3.3

## 2015-01-11 ENCOUNTER — Other Ambulatory Visit: Payer: Self-pay | Admitting: *Deleted

## 2015-01-11 MED ORDER — WARFARIN SODIUM 2 MG PO TABS: ORAL_TABLET | ORAL | Status: DC

## 2015-01-22 ENCOUNTER — Ambulatory Visit (INDEPENDENT_AMBULATORY_CARE_PROVIDER_SITE_OTHER): Payer: PPO | Admitting: *Deleted

## 2015-01-22 DIAGNOSIS — I4891 Unspecified atrial fibrillation: Secondary | ICD-10-CM | POA: Diagnosis not present

## 2015-01-22 DIAGNOSIS — I4892 Unspecified atrial flutter: Secondary | ICD-10-CM | POA: Diagnosis not present

## 2015-01-22 DIAGNOSIS — Z5181 Encounter for therapeutic drug level monitoring: Secondary | ICD-10-CM | POA: Diagnosis not present

## 2015-01-22 DIAGNOSIS — I482 Chronic atrial fibrillation, unspecified: Secondary | ICD-10-CM

## 2015-01-22 DIAGNOSIS — Z7901 Long term (current) use of anticoagulants: Secondary | ICD-10-CM

## 2015-01-22 LAB — POCT INR: INR: 1.7

## 2015-02-04 ENCOUNTER — Other Ambulatory Visit: Payer: Self-pay

## 2015-02-05 ENCOUNTER — Ambulatory Visit (INDEPENDENT_AMBULATORY_CARE_PROVIDER_SITE_OTHER): Payer: PPO | Admitting: *Deleted

## 2015-02-05 DIAGNOSIS — I4892 Unspecified atrial flutter: Secondary | ICD-10-CM | POA: Diagnosis not present

## 2015-02-05 DIAGNOSIS — I4891 Unspecified atrial fibrillation: Secondary | ICD-10-CM | POA: Diagnosis not present

## 2015-02-05 DIAGNOSIS — Z7901 Long term (current) use of anticoagulants: Secondary | ICD-10-CM

## 2015-02-05 DIAGNOSIS — I482 Chronic atrial fibrillation, unspecified: Secondary | ICD-10-CM

## 2015-02-05 DIAGNOSIS — Z5181 Encounter for therapeutic drug level monitoring: Secondary | ICD-10-CM | POA: Diagnosis not present

## 2015-02-05 LAB — POCT INR: INR: 1.3

## 2015-02-18 ENCOUNTER — Ambulatory Visit (INDEPENDENT_AMBULATORY_CARE_PROVIDER_SITE_OTHER): Payer: PPO | Admitting: *Deleted

## 2015-02-18 DIAGNOSIS — I4892 Unspecified atrial flutter: Secondary | ICD-10-CM

## 2015-02-18 DIAGNOSIS — Z5181 Encounter for therapeutic drug level monitoring: Secondary | ICD-10-CM

## 2015-02-18 DIAGNOSIS — I4891 Unspecified atrial fibrillation: Secondary | ICD-10-CM

## 2015-02-18 DIAGNOSIS — I482 Chronic atrial fibrillation, unspecified: Secondary | ICD-10-CM

## 2015-02-18 DIAGNOSIS — Z7901 Long term (current) use of anticoagulants: Secondary | ICD-10-CM | POA: Diagnosis not present

## 2015-02-18 LAB — POCT INR: INR: 2.2

## 2015-03-04 ENCOUNTER — Ambulatory Visit (INDEPENDENT_AMBULATORY_CARE_PROVIDER_SITE_OTHER): Payer: PPO | Admitting: Pharmacist

## 2015-03-04 DIAGNOSIS — I4892 Unspecified atrial flutter: Secondary | ICD-10-CM

## 2015-03-04 DIAGNOSIS — Z7901 Long term (current) use of anticoagulants: Secondary | ICD-10-CM

## 2015-03-04 DIAGNOSIS — I4891 Unspecified atrial fibrillation: Secondary | ICD-10-CM

## 2015-03-04 DIAGNOSIS — Z5181 Encounter for therapeutic drug level monitoring: Secondary | ICD-10-CM | POA: Diagnosis not present

## 2015-03-04 DIAGNOSIS — I482 Chronic atrial fibrillation, unspecified: Secondary | ICD-10-CM

## 2015-03-04 LAB — POCT INR: INR: 1.8

## 2015-03-18 ENCOUNTER — Ambulatory Visit (INDEPENDENT_AMBULATORY_CARE_PROVIDER_SITE_OTHER): Payer: PPO | Admitting: *Deleted

## 2015-03-18 DIAGNOSIS — I482 Chronic atrial fibrillation, unspecified: Secondary | ICD-10-CM

## 2015-03-18 DIAGNOSIS — Z7901 Long term (current) use of anticoagulants: Secondary | ICD-10-CM

## 2015-03-18 DIAGNOSIS — Z5181 Encounter for therapeutic drug level monitoring: Secondary | ICD-10-CM

## 2015-03-18 DIAGNOSIS — I4892 Unspecified atrial flutter: Secondary | ICD-10-CM | POA: Diagnosis not present

## 2015-03-18 DIAGNOSIS — I4891 Unspecified atrial fibrillation: Secondary | ICD-10-CM | POA: Diagnosis not present

## 2015-03-18 LAB — POCT INR: INR: 4

## 2015-04-01 ENCOUNTER — Ambulatory Visit (INDEPENDENT_AMBULATORY_CARE_PROVIDER_SITE_OTHER): Payer: PPO

## 2015-04-01 DIAGNOSIS — Z7901 Long term (current) use of anticoagulants: Secondary | ICD-10-CM

## 2015-04-01 DIAGNOSIS — I482 Chronic atrial fibrillation, unspecified: Secondary | ICD-10-CM

## 2015-04-01 DIAGNOSIS — I4892 Unspecified atrial flutter: Secondary | ICD-10-CM | POA: Diagnosis not present

## 2015-04-01 DIAGNOSIS — Z5181 Encounter for therapeutic drug level monitoring: Secondary | ICD-10-CM

## 2015-04-01 DIAGNOSIS — I4891 Unspecified atrial fibrillation: Secondary | ICD-10-CM | POA: Diagnosis not present

## 2015-04-01 LAB — POCT INR: INR: 2.2

## 2015-04-22 ENCOUNTER — Ambulatory Visit (INDEPENDENT_AMBULATORY_CARE_PROVIDER_SITE_OTHER): Payer: PPO | Admitting: *Deleted

## 2015-04-22 DIAGNOSIS — I4892 Unspecified atrial flutter: Secondary | ICD-10-CM

## 2015-04-22 DIAGNOSIS — I482 Chronic atrial fibrillation, unspecified: Secondary | ICD-10-CM

## 2015-04-22 DIAGNOSIS — I4891 Unspecified atrial fibrillation: Secondary | ICD-10-CM

## 2015-04-22 DIAGNOSIS — Z7901 Long term (current) use of anticoagulants: Secondary | ICD-10-CM | POA: Diagnosis not present

## 2015-04-22 DIAGNOSIS — Z5181 Encounter for therapeutic drug level monitoring: Secondary | ICD-10-CM

## 2015-04-22 LAB — POCT INR: INR: 2.4

## 2015-04-24 ENCOUNTER — Ambulatory Visit (INDEPENDENT_AMBULATORY_CARE_PROVIDER_SITE_OTHER): Payer: PPO | Admitting: Podiatry

## 2015-04-24 ENCOUNTER — Encounter: Payer: Self-pay | Admitting: Podiatry

## 2015-04-24 VITALS — BP 142/64 | HR 56 | Resp 16

## 2015-04-24 DIAGNOSIS — E1151 Type 2 diabetes mellitus with diabetic peripheral angiopathy without gangrene: Secondary | ICD-10-CM | POA: Diagnosis not present

## 2015-04-24 DIAGNOSIS — M79676 Pain in unspecified toe(s): Secondary | ICD-10-CM | POA: Diagnosis not present

## 2015-04-24 DIAGNOSIS — B351 Tinea unguium: Secondary | ICD-10-CM | POA: Diagnosis not present

## 2015-04-24 DIAGNOSIS — E0940 Drug or chemical induced diabetes mellitus with neurological complications with diabetic neuropathy, unspecified: Secondary | ICD-10-CM | POA: Diagnosis not present

## 2015-04-24 DIAGNOSIS — Q828 Other specified congenital malformations of skin: Secondary | ICD-10-CM | POA: Diagnosis not present

## 2015-04-24 NOTE — Progress Notes (Signed)
Subjective:     Patient ID: Eric [name]., male   DOB: 06-14-1928, 81 y.o.   MRN: [id number]  HPI long-term diabetic who presents with lesion underneath the fifth metatarsal head bilateral with slight breakdown of tissue underneath the right fifth metatarsal and admitting he should've been here earlier. Also complains and nail disease bilateral that he cannot cut and can become painful   Review of Systems     Objective:   Physical Exam Neurovascular status mildly diminished bilateral with diminishment of sharp Dole vibratory and noted to have a keratotic lesion sub-fifth right that has hemorrhagic changes and localized discomfort but no proximal edema erythema drainage and lesion also on the left with nail disease 1-5 both feet that are thick yellow with brittle debris    Assessment:     Lesion that is secondary to trauma with neuropathy and diabetic vascular disease plan oval along with symptomatic nail disease 1-5 both feet with mycosis noted    Plan:     Reviewed both condition and diabetic education rendered. Debridement of nailbeds 1-5 both feet accomplished and debridement of lesions on both feet accomplished with no iatrogenic bleeding and encouraged him that this needs to be done on a regular basis. Reappoint in 3 months

## 2015-04-30 ENCOUNTER — Other Ambulatory Visit: Payer: PPO

## 2015-04-30 ENCOUNTER — Telehealth: Payer: Self-pay | Admitting: Family Medicine

## 2015-04-30 DIAGNOSIS — N400 Enlarged prostate without lower urinary tract symptoms: Secondary | ICD-10-CM

## 2015-04-30 DIAGNOSIS — E785 Hyperlipidemia, unspecified: Secondary | ICD-10-CM

## 2015-04-30 DIAGNOSIS — E119 Type 2 diabetes mellitus without complications: Secondary | ICD-10-CM

## 2015-04-30 DIAGNOSIS — Z79899 Other long term (current) drug therapy: Secondary | ICD-10-CM

## 2015-04-30 DIAGNOSIS — I10 Essential (primary) hypertension: Secondary | ICD-10-CM

## 2015-04-30 LAB — COMPLETE METABOLIC PANEL WITH GFR
ALT: 17 U/L (ref 9–46)
AST: 24 U/L (ref 10–35)
Albumin: 4.1 g/dL (ref 3.6–5.1)
Alkaline Phosphatase: 25 U/L — ABNORMAL LOW (ref 40–115)
BUN: 22 mg/dL (ref 7–25)
CALCIUM: 9.4 mg/dL (ref 8.6–10.3)
CHLORIDE: 103 mmol/L (ref 98–110)
CO2: 26 mmol/L (ref 20–31)
CREATININE: 1.53 mg/dL — AB (ref 0.70–1.11)
GFR, Est African American: 47 mL/min — ABNORMAL LOW (ref >=60)
GFR, Est Non African American: 41 mL/min — ABNORMAL LOW (ref >=60)
GLUCOSE: 107 mg/dL — AB (ref 70–99)
POTASSIUM: 4.2 mmol/L (ref 3.5–5.3)
Sodium: 139 mmol/L (ref 135–146)
Total Bilirubin: 0.7 mg/dL (ref 0.2–1.2)
Total Protein: 6.8 g/dL (ref 6.1–8.1)

## 2015-04-30 LAB — CBC WITH DIFFERENTIAL/PLATELET
Basophils Absolute: 0.1 10*3/uL (ref 0.0–0.1)
Basophils Relative: 1 % (ref 0–1)
EOS ABS: 0.2 10*3/uL (ref 0.0–0.7)
EOS PCT: 3 % (ref 0–5)
HEMATOCRIT: 39.9 % (ref 39.0–52.0)
Hemoglobin: 13 g/dL (ref 13.0–17.0)
LYMPHS PCT: 30 % (ref 12–46)
Lymphs Abs: 1.9 10*3/uL (ref 0.7–4.0)
MCH: 30.9 pg (ref 26.0–34.0)
MCHC: 32.6 g/dL (ref 30.0–36.0)
MCV: 94.8 fL (ref 78.0–100.0)
MONO ABS: 0.5 10*3/uL (ref 0.1–1.0)
MPV: 10.6 fL (ref 8.6–12.4)
Monocytes Relative: 8 % (ref 3–12)
Neutro Abs: 3.7 10*3/uL (ref 1.7–7.7)
Neutrophils Relative %: 58 % (ref 43–77)
PLATELETS: 177 10*3/uL (ref 150–400)
RBC: 4.21 MIL/uL — AB (ref 4.22–5.81)
RDW: 15.2 % (ref 11.5–15.5)
WBC: 6.4 10*3/uL (ref 4.0–10.5)

## 2015-04-30 LAB — HEMOGLOBIN A1C
Hgb A1c MFr Bld: 6.7 % — ABNORMAL HIGH (ref <5.7)
MEAN PLASMA GLUCOSE: 146 mg/dL — AB (ref <117)

## 2015-04-30 LAB — LIPID PANEL
CHOL/HDL RATIO: 8.9 ratio — AB (ref <=5.0)
Cholesterol: 143 mg/dL (ref 125–200)
HDL: 16 mg/dL — ABNORMAL LOW (ref >=40)
LDL Cholesterol: 88 mg/dL (ref <130)
Triglycerides: 196 mg/dL — ABNORMAL HIGH (ref <150)
VLDL: 39 mg/dL — ABNORMAL HIGH (ref <30)

## 2015-04-30 LAB — TSH: TSH: 3.428 u[IU]/mL (ref 0.350–4.500)

## 2015-04-30 MED ORDER — FENOFIBRATE 160 MG PO TABS: 160.0000 mg | ORAL_TABLET | Freq: Every day | ORAL | Status: DC

## 2015-04-30 NOTE — Telephone Encounter (Signed)
Medication called/sent to requested pharmacy  

## 2015-04-30 NOTE — Telephone Encounter (Signed)
Patient needs refill on fenofibrate [facility] at [phone]

## 2015-05-01 LAB — PSA, MEDICARE: PSA: 0.4 ng/mL (ref <=4.00)

## 2015-05-06 ENCOUNTER — Encounter: Payer: Self-pay | Admitting: Family Medicine

## 2015-05-06 ENCOUNTER — Ambulatory Visit (INDEPENDENT_AMBULATORY_CARE_PROVIDER_SITE_OTHER): Payer: PPO | Admitting: Family Medicine

## 2015-05-06 VITALS — BP 102/58 | HR 56 | Temp 98.1°F | Resp 22 | Wt 184.0 lb

## 2015-05-06 DIAGNOSIS — I10 Essential (primary) hypertension: Secondary | ICD-10-CM | POA: Diagnosis not present

## 2015-05-06 DIAGNOSIS — I2510 Atherosclerotic heart disease of native coronary artery without angina pectoris: Secondary | ICD-10-CM | POA: Diagnosis not present

## 2015-05-06 DIAGNOSIS — Z23 Encounter for immunization: Secondary | ICD-10-CM

## 2015-05-06 DIAGNOSIS — N183 Chronic kidney disease, stage 3 (moderate): Secondary | ICD-10-CM | POA: Diagnosis not present

## 2015-05-06 DIAGNOSIS — E785 Hyperlipidemia, unspecified: Secondary | ICD-10-CM

## 2015-05-06 DIAGNOSIS — E119 Type 2 diabetes mellitus without complications: Secondary | ICD-10-CM

## 2015-05-06 DIAGNOSIS — I739 Peripheral vascular disease, unspecified: Secondary | ICD-10-CM

## 2015-05-06 MED ORDER — ROSUVASTATIN CALCIUM 40 MG PO TABS: 40.0000 mg | ORAL_TABLET | Freq: Every day | ORAL | Status: DC

## 2015-05-06 NOTE — Progress Notes (Signed)
Subjective:    Patient ID: Eric [name]., male    DOB: 24-Jun-1928, 81 y.o.   MRN: [id number]  HPI  Patient is a very pleasant 81 year old white male with a history of type 2 diabetes mellitus, chronic kidney disease stage III, hypertension, hyperlipidemia, hypertriglyceridemia, coronary artery disease status post CABG, and peripheral vascular disease. He is here today for follow-up of his chronic medical problems. He denies any chest pain shortness of breath or dyspnea on exertion. He denies any myalgias or right upper quadrant pain. He denies any polyuria, polydipsia, or blurred vision. He denies any hypoglycemic episodes. He denies significant claudication with ambulation. He is due for a flu shot as well as Prevnar 13. His Coumadin was checked at his cardiologist office. He denies any syncope or presyncope or tachycardia. Lab on 04/30/2015  Component Date Value Ref Range Status  . PSA 04/30/2015 0.40  <=4.00 ng/mL Final   Comment: Test Methodology: ECLIA PSA (Electrochemiluminescence Immunoassay)   For PSA values from 2.5-4.0, particularly in younger men <34 years old, the AUA and NCCN suggest testing for % Free PSA (3515) and evaluation of the rate of increase in PSA (PSA velocity).   Footnotes:  (1) ** Please note change in unit of measure and reference range(s). **     . Sodium 04/30/2015 139  135 - 146 mmol/L Final  . Potassium 04/30/2015 4.2  3.5 - 5.3 mmol/L Final  . Chloride 04/30/2015 103  98 - 110 mmol/L Final  . CO2 04/30/2015 26  20 - 31 mmol/L Final  . Glucose, Bld 04/30/2015 107* 70 - 99 mg/dL Final  . BUN 04/30/2015 22  7 - 25 mg/dL Final  . Creat 04/30/2015 1.53* 0.70 - 1.11 mg/dL Final  . Total Bilirubin 04/30/2015 0.7  0.2 - 1.2 mg/dL Final  . Alkaline Phosphatase 04/30/2015 25* 40 - 115 U/L Final  . AST 04/30/2015 24  10 - 35 U/L Final  . ALT 04/30/2015 17  9 - 46 U/L Final  . Total Protein 04/30/2015 6.8  6.1 - 8.1 g/dL Final  . Albumin 04/30/2015 4.1   3.6 - 5.1 g/dL Final  . Calcium 04/30/2015 9.4  8.6 - 10.3 mg/dL Final  . GFR, Est African American 04/30/2015 47* >=60 mL/min Final  . GFR, Est Non African American 04/30/2015 41* >=60 mL/min Final   Comment:   The estimated GFR is a calculation valid for adults (>=22 years old) that uses the CKD-EPI algorithm to adjust for age and sex. It is   not to be used for children, pregnant women, hospitalized patients,    patients on dialysis, or with rapidly changing kidney function. According to the NKDEP, eGFR >89 is normal, 60-89 shows mild impairment, 30-59 shows moderate impairment, 15-29 shows severe impairment and <15 is ESRD.     . TSH 04/30/2015 3.428  0.350 - 4.500 uIU/mL Final  . Cholesterol 04/30/2015 143  125 - 200 mg/dL Final  . Triglycerides 04/30/2015 196* <150 mg/dL Final  . HDL 04/30/2015 16* >=40 mg/dL Final  . Total CHOL/HDL Ratio 04/30/2015 8.9* <=5.0 Ratio Final  . VLDL 04/30/2015 39* <30 mg/dL Final  . LDL Cholesterol 04/30/2015 88  <130 mg/dL Final   Comment:   Total Cholesterol/HDL Ratio:CHD Risk                        Coronary Heart Disease Risk Table  Men       Women          1/2 Average Risk              3.4        3.3              Average Risk              5.0        4.4           2X Average Risk              9.6        7.1           3X Average Risk             23.4       11.0 Use the calculated Patient Ratio above and the CHD Risk table  to determine the patient's CHD Risk.   . WBC 04/30/2015 6.4  4.0 - 10.5 K/uL Final  . RBC 04/30/2015 4.21* 4.22 - 5.81 MIL/uL Final  . Hemoglobin 04/30/2015 13.0  13.0 - 17.0 g/dL Final  . HCT 04/30/2015 39.9  39.0 - 52.0 % Final  . MCV 04/30/2015 94.8  78.0 - 100.0 fL Final  . MCH 04/30/2015 30.9  26.0 - 34.0 pg Final  . MCHC 04/30/2015 32.6  30.0 - 36.0 g/dL Final  . RDW 04/30/2015 15.2  11.5 - 15.5 % Final  . Platelets 04/30/2015 177  150 - 400 K/uL Final  . MPV 04/30/2015 10.6   8.6 - 12.4 fL Final  . Neutrophils Relative % 04/30/2015 58  43 - 77 % Final  . Neutro Abs 04/30/2015 3.7  1.7 - 7.7 K/uL Final  . Lymphocytes Relative 04/30/2015 30  12 - 46 % Final  . Lymphs Abs 04/30/2015 1.9  0.7 - 4.0 K/uL Final  . Monocytes Relative 04/30/2015 8  3 - 12 % Final  . Monocytes Absolute 04/30/2015 0.5  0.1 - 1.0 K/uL Final  . Eosinophils Relative 04/30/2015 3  0 - 5 % Final  . Eosinophils Absolute 04/30/2015 0.2  0.0 - 0.7 K/uL Final  . Basophils Relative 04/30/2015 1  0 - 1 % Final  . Basophils Absolute 04/30/2015 0.1  0.0 - 0.1 K/uL Final  . Smear Review 04/30/2015 Criteria for review not met   Final  . Hgb A1c MFr Bld 04/30/2015 6.7* <5.7 % Final   Comment:                                                                        According to the ADA Clinical Practice Recommendations for 2011, when HbA1c is used as a screening test:     >=6.5%   Diagnostic of Diabetes Mellitus            (if abnormal result is confirmed)   5.7-6.4%   Increased risk of developing Diabetes Mellitus   References:Diagnosis and Classification of Diabetes Mellitus,Diabetes TKWI,0973,53(GDJME 1):S62-S69 and Standards of Medical Care in         Diabetes - 2011,Diabetes QAST,4196,22 (Suppl 1):S11-S61.     . Mean Plasma Glucose 04/30/2015 146* <117 mg/dL Final  Anti-coag visit on 04/22/2015  Component  Date Value Ref Range Status  . INR 04/22/2015 2.4   Final   Past Medical History  Diagnosis Date  . CAD (coronary artery disease)     cath 2003, occluded S-RCA, occluded S-Dx, L-LAD ok, s/p PTCA to LAD  . Atrial fibrillation   . Atrial flutter     s/p RFCA  . Long term (current) use of anticoagulants   . Pure hyperglyceridemia   . Unspecified essential hypertension   . Diabetes mellitus   . OSA (obstructive sleep apnea) 12/11    very mild, AHI 7/hr  . PVD (peripheral vascular disease)     angioplasty of his right lower extremity in [place] by Dr.[name] 2013  . Cataract   .  Neuromuscular disorder    Past Surgical History  Procedure Laterality Date  . Nm myoview ltd  4/11    normal  . Angioplasty  1/99    CAD- diogonal with rotational artherectomy  . Carpal tunnel release      ? bilateral  . Cardiac catheterization  1/00  . Arthrectomy      of LAD & PTCA  . Coronary artery bypass graft  2000  . Ptca  4/03    admit unstable angina  . Cardioversion  1/04  . Cardioversion  5/07    hospital- a flutter  . Adenosine myoview  3/06    EF 56%, neg. Ischemia  . Adenosine myoview  02/18/07    nml  . Hand surgery  08/27/09    R thumb procedure wit Scaphoid Gragt and screws, Dr [name]  . Colonoscopy w/ biopsies  10/01/06    sigmoid polyp bx neg, 3 years  . Fracture surgery     Current Outpatient Prescriptions on File Prior to Visit  Medication Sig Dispense Refill  . acetaminophen (TYLENOL) 500 MG tablet Take 1,000 mg by mouth every 8 (eight) hours as needed for mild pain.     [name] amLODipine (NORVASC) 5 MG tablet Take 1 tablet (5 mg total) by mouth daily. 90 tablet 3  . atorvastatin (LIPITOR) 40 MG tablet Take 1 tablet (40 mg total) by mouth daily. 90 tablet 3  . B Complex Vitamins (B COMPLEX-B12) TABS Take 1 tablet by mouth daily.     . Blood Glucose Monitoring Suppl (ONE TOUCH ULTRA SYSTEM KIT) W/DEVICE KIT 1 kit by Does not apply route once. 1 each 0  . cholecalciferol (VITAMIN D) 1000 UNITS tablet Take 1,000 Units by mouth 2 (two) times a week. Takes on Mon and Fri    . CINNAMON PO Take by mouth 2 (two) times daily.     . fenofibrate 160 MG tablet Take 1 tablet (160 mg total) by mouth daily. 90 tablet 3  . fish oil-omega-3 fatty acids 1000 MG capsule Take 3 g by mouth daily.     . furosemide (LASIX) 20 MG tablet Take 1 tablet (20 mg total) by mouth daily. 90 tablet 3  . glucose blood test strip Use as instructed 100 each 3  . lisinopril (PRINIVIL,ZESTRIL) 40 MG tablet Take 1 tablet (40 mg total) by mouth daily. 90 tablet 3  . metoprolol (LOPRESSOR) 50 MG  tablet Take 1 tablet (50 mg total) by mouth 2 (two) times daily. 180 tablet 3  . Multiple Vitamin (DAILY MULTIVITAMIN PO) Take 1 tablet by mouth daily.      [name] DELICA LANCETS FINE MISC 1 each by Does not apply route daily. 100 each 3  . traMADol (ULTRAM) 50 MG tablet Take 1  tablet (50 mg total) by mouth every 12 (twelve) hours as needed for moderate pain. 20 tablet 0  . warfarin (COUMADIN) 2 MG tablet Take as directed by coumadin clinic 90 tablet 1   No current facility-administered medications on file prior to visit.   Allergies  Allergen Reactions  . Isosorbide Mononitrate Other (See Comments)    Unknown   . Quinidine Gluconate Other (See Comments)    Unknown  . Ramipril Other (See Comments)    Unknown   Social History   Social History  . Marital Status: Married    Spouse Name: N/A  . Number of Children: 5  . Years of Education: N/A   Occupational History  . [employer]    Social History Main Topics  . Smoking status: Never Smoker   . Smokeless tobacco: Former Systems developer  . Alcohol Use: No  . Drug Use: No  . Sexual Activity: No   Other Topics Concern  . Not on file   Social History Narrative   Retired now does some antiques   Retired from Theatre manager and dye work   Married 1951   5 kids       Review of Systems  All other systems reviewed and are negative.      Objective:   Physical Exam  Constitutional: He is oriented to person, place, and time. He appears well-developed and well-nourished. No distress.  HENT:  Mouth/Throat: No oropharyngeal exudate.  Eyes: Conjunctivae are normal.  Neck: Neck supple. No JVD present. No thyromegaly present.  Cardiovascular: Normal rate and normal heart sounds.  An irregularly irregular rhythm present. Exam reveals no gallop and no friction rub.   No murmur heard. Pulmonary/Chest: Effort normal and breath sounds normal. No respiratory distress. He has no wheezes. He has no rales.  Abdominal: Soft. Bowel sounds are  normal. He exhibits no distension. There is no tenderness. There is no rebound and no guarding.  Musculoskeletal: He exhibits no edema.  Lymphadenopathy:    He has no cervical adenopathy.  Neurological: He is alert and oriented to person, place, and time. He has normal reflexes. No cranial nerve deficit.  Skin: He is not diaphoretic.  Vitals reviewed.         Assessment & Plan:  HLD (hyperlipidemia) - Plan: rosuvastatin (CRESTOR) 40 MG tablet  Coronary artery disease involving native coronary artery of native heart without angina pectoris  PVD (peripheral vascular disease)  Essential hypertension  Diabetes mellitus type II, controlled  CKD (chronic kidney disease), stage 3 (moderate)  I reviewed the patient's labs were significant for an LDL cholesterol of 88 and an HDL cholesterol 16. For that reason I will discontinue Lipitor and placed the patient on maximum dose Crestor 40 mg by mouth daily and recheck fasting lab work in 3 months. Hemoglobin A1c is 6.7 is acceptable. Blood pressure today is outstanding. Renal function is stable. Diabetic foot exam is normal. Recommended diabetic eye exam. Patient received a flu shot today as well as Prevnar 13.

## 2015-05-06 NOTE — Addendum Note (Signed)
Addended by: Shary Decamp B on: 05/06/2015 10:04 AM   Modules accepted: Orders

## 2015-05-13 ENCOUNTER — Telehealth: Payer: Self-pay | Admitting: Family Medicine

## 2015-05-13 MED ORDER — GLUCOSE BLOOD VI STRP: ORAL_STRIP | Status: DC

## 2015-05-13 NOTE — Telephone Encounter (Signed)
Medication called/sent to requested pharmacy  

## 2015-05-13 NOTE — Telephone Encounter (Signed)
Patient is calling to get rx [facility] for test strips  [phone]

## 2015-05-14 ENCOUNTER — Other Ambulatory Visit: Payer: Self-pay | Admitting: *Deleted

## 2015-05-14 MED ORDER — ONETOUCH DELICA LANCETS FINE MISC: 1.0000 | Freq: Every day | Status: DC

## 2015-05-14 NOTE — Telephone Encounter (Signed)
Pt wife called stating that he never received refill on lancets, I refilled and sent to pharmacy

## 2015-05-20 ENCOUNTER — Ambulatory Visit (INDEPENDENT_AMBULATORY_CARE_PROVIDER_SITE_OTHER): Payer: PPO | Admitting: *Deleted

## 2015-05-20 DIAGNOSIS — I4892 Unspecified atrial flutter: Secondary | ICD-10-CM

## 2015-05-20 DIAGNOSIS — Z5181 Encounter for therapeutic drug level monitoring: Secondary | ICD-10-CM

## 2015-05-20 DIAGNOSIS — I482 Chronic atrial fibrillation, unspecified: Secondary | ICD-10-CM

## 2015-05-20 DIAGNOSIS — Z7901 Long term (current) use of anticoagulants: Secondary | ICD-10-CM

## 2015-05-20 DIAGNOSIS — I4891 Unspecified atrial fibrillation: Secondary | ICD-10-CM

## 2015-05-20 LAB — POCT INR: INR: 2.9

## 2015-06-03 ENCOUNTER — Other Ambulatory Visit: Payer: Self-pay

## 2015-06-03 DIAGNOSIS — I739 Peripheral vascular disease, unspecified: Secondary | ICD-10-CM

## 2015-06-03 DIAGNOSIS — I4891 Unspecified atrial fibrillation: Secondary | ICD-10-CM

## 2015-06-03 DIAGNOSIS — I482 Chronic atrial fibrillation, unspecified: Secondary | ICD-10-CM

## 2015-06-03 DIAGNOSIS — I10 Essential (primary) hypertension: Secondary | ICD-10-CM

## 2015-06-03 DIAGNOSIS — I2510 Atherosclerotic heart disease of native coronary artery without angina pectoris: Secondary | ICD-10-CM

## 2015-06-03 MED ORDER — AMLODIPINE BESYLATE 5 MG PO TABS: 5.0000 mg | ORAL_TABLET | Freq: Every day | ORAL | Status: DC

## 2015-06-17 ENCOUNTER — Ambulatory Visit (INDEPENDENT_AMBULATORY_CARE_PROVIDER_SITE_OTHER): Payer: PPO | Admitting: *Deleted

## 2015-06-17 DIAGNOSIS — I4891 Unspecified atrial fibrillation: Secondary | ICD-10-CM | POA: Diagnosis not present

## 2015-06-17 DIAGNOSIS — I482 Chronic atrial fibrillation, unspecified: Secondary | ICD-10-CM

## 2015-06-17 DIAGNOSIS — Z5181 Encounter for therapeutic drug level monitoring: Secondary | ICD-10-CM

## 2015-06-17 DIAGNOSIS — I4892 Unspecified atrial flutter: Secondary | ICD-10-CM

## 2015-06-17 DIAGNOSIS — Z7901 Long term (current) use of anticoagulants: Secondary | ICD-10-CM | POA: Diagnosis not present

## 2015-06-17 LAB — POCT INR: INR: 2.2

## 2015-06-24 ENCOUNTER — Other Ambulatory Visit: Payer: Self-pay | Admitting: Vascular Surgery

## 2015-06-27 ENCOUNTER — Encounter
Admission: RE | Disposition: A | Discharge status: Home / Self Care | Payer: Self-pay | Source: Ambulatory Visit | Attending: Vascular Surgery

## 2015-06-27 ENCOUNTER — Encounter: Payer: Self-pay | Admitting: *Deleted

## 2015-06-27 ENCOUNTER — Ambulatory Visit
Admission: RE | Admit: 2015-06-27 | Discharge: 2015-06-27 | Disposition: A | Discharge status: Home / Self Care | Payer: PPO | Source: Ambulatory Visit | Attending: Vascular Surgery | Admitting: Vascular Surgery

## 2015-06-27 DIAGNOSIS — E119 Type 2 diabetes mellitus without complications: Secondary | ICD-10-CM | POA: Diagnosis not present

## 2015-06-27 DIAGNOSIS — L97519 Non-pressure chronic ulcer of other part of right foot with unspecified severity: Secondary | ICD-10-CM | POA: Insufficient documentation

## 2015-06-27 DIAGNOSIS — E785 Hyperlipidemia, unspecified: Secondary | ICD-10-CM | POA: Insufficient documentation

## 2015-06-27 DIAGNOSIS — I499 Cardiac arrhythmia, unspecified: Secondary | ICD-10-CM | POA: Diagnosis not present

## 2015-06-27 DIAGNOSIS — I70235 Atherosclerosis of native arteries of right leg with ulceration of other part of foot: Secondary | ICD-10-CM | POA: Diagnosis present

## 2015-06-27 DIAGNOSIS — Z79899 Other long term (current) drug therapy: Secondary | ICD-10-CM | POA: Insufficient documentation

## 2015-06-27 DIAGNOSIS — I10 Essential (primary) hypertension: Secondary | ICD-10-CM | POA: Insufficient documentation

## 2015-06-27 DIAGNOSIS — Z7901 Long term (current) use of anticoagulants: Secondary | ICD-10-CM | POA: Insufficient documentation

## 2015-06-27 HISTORY — PX: PERIPHERAL VASCULAR CATHETERIZATION: SHX172C

## 2015-06-27 LAB — BASIC METABOLIC PANEL
ANION GAP: 10 (ref 5–15)
BUN: 30 mg/dL — ABNORMAL HIGH (ref 6–20)
CO2: 25 mmol/L (ref 22–32)
Calcium: 9.8 mg/dL (ref 8.9–10.3)
Chloride: 104 mmol/L (ref 101–111)
Creatinine, Ser: 1.52 mg/dL — ABNORMAL HIGH (ref 0.61–1.24)
GFR, EST AFRICAN AMERICAN: 46 mL/min — AB (ref >60)
GFR, EST NON AFRICAN AMERICAN: 40 mL/min — AB (ref >60)
GLUCOSE: 121 mg/dL — AB (ref 65–99)
POTASSIUM: 4.7 mmol/L (ref 3.5–5.1)
SODIUM: 139 mmol/L (ref 135–145)

## 2015-06-27 LAB — PROTIME-INR
INR: 1.67
PROTHROMBIN TIME: 19.7 s — AB (ref 11.4–15.0)

## 2015-06-27 SURGERY — LOWER EXTREMITY ANGIOGRAPHY
Anesthesia: Moderate Sedation | Laterality: Right

## 2015-06-27 MED ORDER — FENTANYL CITRATE (PF) 100 MCG/2ML IJ SOLN
INTRAMUSCULAR | Status: DC | PRN
  Administered 2015-06-27: 50 ug via INTRAVENOUS

## 2015-06-27 MED ORDER — LIDOCAINE HCL (PF) 1 % IJ SOLN
INTRAMUSCULAR | Status: AC
  Filled 2015-06-27: qty 30

## 2015-06-27 MED ORDER — HEPARIN (PORCINE) IN NACL 2-0.9 UNIT/ML-% IJ SOLN
INTRAMUSCULAR | Status: AC
  Filled 2015-06-27: qty 1000

## 2015-06-27 MED ORDER — ONDANSETRON HCL 4 MG/2ML IJ SOLN: 4.0000 mg | Freq: Four times a day (QID) | INTRAMUSCULAR | Status: DC | PRN

## 2015-06-27 MED ORDER — PHENOL 1.4 % MT LIQD: 1.0000 | OROMUCOSAL | Status: DC | PRN

## 2015-06-27 MED ORDER — HEPARIN SODIUM (PORCINE) 1000 UNIT/ML IJ SOLN
INTRAMUSCULAR | Status: AC
  Filled 2015-06-27: qty 1

## 2015-06-27 MED ORDER — MIDAZOLAM HCL 2 MG/2ML IJ SOLN
INTRAMUSCULAR | Status: AC
  Filled 2015-06-27: qty 2

## 2015-06-27 MED ORDER — ACETAMINOPHEN 325 MG PO TABS: 325.0000 mg | ORAL_TABLET | ORAL | Status: DC | PRN

## 2015-06-27 MED ORDER — GUAIFENESIN-DM 100-10 MG/5ML PO SYRP: 15.0000 mL | ORAL_SOLUTION | ORAL | Status: DC | PRN

## 2015-06-27 MED ORDER — MIDAZOLAM HCL 2 MG/2ML IJ SOLN
INTRAMUSCULAR | Status: DC | PRN
  Administered 2015-06-27: 2 mg via INTRAVENOUS

## 2015-06-27 MED ORDER — METOPROLOL TARTRATE 1 MG/ML IV SOLN: 2.0000 mg | INTRAVENOUS | Status: DC | PRN

## 2015-06-27 MED ORDER — SODIUM BICARBONATE 8.4 % IV SOLN
Freq: Once | INTRAVENOUS | Status: AC
  Administered 2015-06-27: 10:00:00 via INTRAVENOUS
  Filled 2015-06-27: qty 1000

## 2015-06-27 MED ORDER — FENTANYL CITRATE (PF) 100 MCG/2ML IJ SOLN
INTRAMUSCULAR | Status: AC
  Filled 2015-06-27: qty 2

## 2015-06-27 MED ORDER — SODIUM CHLORIDE 0.9 % IV SOLN
INTRAVENOUS | Status: DC
  Administered 2015-06-27: 08:00:00 via INTRAVENOUS

## 2015-06-27 MED ORDER — LABETALOL HCL 5 MG/ML IV SOLN: 10.0000 mg | INTRAVENOUS | Status: DC | PRN

## 2015-06-27 MED ORDER — SODIUM CHLORIDE 0.9 % IV SOLN: 500.0000 mL | Freq: Once | INTRAVENOUS | Status: DC | PRN

## 2015-06-27 MED ORDER — IOHEXOL 300 MG/ML  SOLN
INTRAMUSCULAR | Status: DC | PRN
  Administered 2015-06-27: 65 mL via INTRA_ARTERIAL

## 2015-06-27 MED ORDER — DEXTROSE 5 % IV SOLN
1.5000 g | INTRAVENOUS | Status: AC
  Administered 2015-06-27: 1.5 g via INTRAVENOUS
  Filled 2015-06-27: qty 1.5

## 2015-06-27 MED ORDER — OXYCODONE-ACETAMINOPHEN 5-325 MG PO TABS: 1.0000 | ORAL_TABLET | ORAL | Status: DC | PRN

## 2015-06-27 MED ORDER — LIDOCAINE-EPINEPHRINE (PF) 1 %-1:200000 IJ SOLN
INTRAMUSCULAR | Status: DC | PRN
  Administered 2015-06-27: 10 mL

## 2015-06-27 MED ORDER — MIDAZOLAM HCL 5 MG/5ML IJ SOLN
INTRAMUSCULAR | Status: AC
  Filled 2015-06-27: qty 5

## 2015-06-27 MED ORDER — ACETAMINOPHEN 325 MG RE SUPP: 325.0000 mg | RECTAL | Status: DC | PRN

## 2015-06-27 MED ORDER — HYDRALAZINE HCL 20 MG/ML IJ SOLN: 5.0000 mg | INTRAMUSCULAR | Status: DC | PRN

## 2015-06-27 MED ORDER — HYDROMORPHONE HCL 1 MG/ML IJ SOLN: 0.5000 mg | INTRAMUSCULAR | Status: DC | PRN

## 2015-06-27 MED ORDER — INSULIN ASPART 100 UNIT/ML ~~LOC~~ SOLN: 0.0000 [IU] | SUBCUTANEOUS | Status: DC

## 2015-06-27 MED ORDER — HEPARIN SODIUM (PORCINE) 1000 UNIT/ML IJ SOLN
INTRAMUSCULAR | Status: DC | PRN
  Administered 2015-06-27: 4000 [IU] via INTRAVENOUS

## 2015-06-27 MED ORDER — SODIUM BICARBONATE BOLUS VIA INFUSION
Freq: Once | INTRAVENOUS | Status: AC
  Administered 2015-06-27: 10:00:00 via INTRAVENOUS
  Filled 2015-06-27: qty 1

## 2015-06-27 SURGICAL SUPPLY — 16 items
BALLN LUTONIX 5X150X130 (BALLOONS) ×4
BALLN ULTRVRSE 3X100X130C (BALLOONS) ×4
BALLOON LUTONIX 5X150X130 (BALLOONS) IMPLANT
BALLOON ULTRVRSE 3X100X130C (BALLOONS) IMPLANT
CATH PIG 70CM (CATHETERS) ×2 IMPLANT
CATH VERT 100CM (CATHETERS) ×2 IMPLANT
DEVICE PRESTO INFLATION (MISCELLANEOUS) ×2 IMPLANT
DEVICE STARCLOSE SE CLOSURE (Vascular Products) ×2 IMPLANT
GLIDEWIRE ADV .035X260CM (WIRE) ×2 IMPLANT
KIT 5FR STIFF NT/TG (MISCELLANEOUS) ×2 IMPLANT
PACK ANGIOGRAPHY (CUSTOM PROCEDURE TRAY) ×2 IMPLANT
SHEATH ANL2 6FRX45 HC (SHEATH) ×2 IMPLANT
SHEATH BRITE TIP 5FRX11 (SHEATH) ×2 IMPLANT
SYR MEDRAD MARK V 150ML (SYRINGE) ×2 IMPLANT
TUBING CONTRAST HIGH PRESS 72 (TUBING) ×2 IMPLANT
WIRE J 3MM .035X145CM (WIRE) ×2 IMPLANT

## 2015-06-27 NOTE — Discharge Instructions (Signed)
Angiogram, Care After °Refer to this sheet in the next few weeks. These instructions provide you with information about caring for yourself after your procedure. Your health care provider may also give you more specific instructions. Your treatment has been planned according to current medical practices, but problems sometimes occur. Call your health care provider if you have any problems or questions after your procedure. °WHAT TO EXPECT AFTER THE PROCEDURE °After your procedure, it is typical to have the following: °· Bruising at the catheter insertion site that usually fades within 1-2 weeks. °· Blood collecting in the tissue (hematoma) that may be painful to the touch. It should usually decrease in size and tenderness within 1-2 weeks. °HOME CARE INSTRUCTIONS °· Take medicines only as directed by your health care provider. °· You may shower 24-48 hours after the procedure or as directed by your health care provider. Remove the bandage (dressing) and gently wash the site with plain soap and water. Pat the area dry with a clean towel. Do not rub the site, because this may cause bleeding. °· Do not take baths, swim, or use a hot tub until your health care provider approves. °· Check your insertion site every day for redness, swelling, or drainage. °· Do not apply powder or lotion to the site. °· Do not lift over 10 lb (4.5 kg) for 5 days after your procedure or as directed by your health care provider. °· Ask your health care provider when it is okay to: °¨ Return to work or school. °¨ Resume usual physical activities or sports. °¨ Resume sexual activity. °· Do not drive home if you are discharged the same day as the procedure. Have someone else drive you. °· You may drive 24 hours after the procedure unless otherwise instructed by your health care provider. °· Do not operate machinery or power tools for 24 hours after the procedure or as directed by your health care provider. °· If your procedure was done as an  outpatient procedure, which means that you went home the same day as your procedure, a responsible adult should be with you for the first 24 hours after you arrive home. °· Keep all follow-up visits as directed by your health care provider. This is important. °SEEK MEDICAL CARE IF: °· You have a fever. °· You have chills. °· You have increased bleeding from the catheter insertion site. Hold pressure on the site. °SEEK IMMEDIATE MEDICAL CARE IF: °· You have unusual pain at the catheter insertion site. °· You have redness, warmth, or swelling at the catheter insertion site. °· You have drainage (other than a small amount of blood on the dressing) from the catheter insertion site. °· The catheter insertion site is bleeding, and the bleeding does not stop after 30 minutes of holding steady pressure on the site. °· The area near or just beyond the catheter insertion site becomes pale, cool, tingly, or numb. °  °This information is not intended to replace advice given to you by your health care provider. Make sure you discuss any questions you have with your health care provider. °  °Document Released: 02/12/2005 Document Revised: 08/17/2014 Document Reviewed: 12/28/2012 °Elsevier Interactive Patient Education ©2016 [employer]. ° °

## 2015-06-27 NOTE — Op Note (Signed)
Eric [name] Percutaneous Study/Intervention Procedural Note   Date of Surgery: 06/27/2015  Surgeon(s):Ayra Hodgdon   Assistants:none  Pre-operative Diagnosis: PAD with ulceration RLE, DM, HTN  Post-operative diagnosis: Same  Procedure(s) Performed: 1. Ultrasound guidance for vascular access  left femoral artery 2. Catheter placement into  Right posterior tibial artery from left femoral approach 3. Aortogram and selective  right lower extremity angiogram 4. Percutaneous transluminal angioplasty of  Distal right posterior tibial artery with 3 mm diameter by 10 cm length angioplasty balloon 5.  Percutaneous transluminal angioplasty of right popliteal artery with 5 mm diameter by 15 cm length Lutonix drug-coated angioplasty balloon  6.   StarClose closure device  left femoral artery  EBL:  minimal  Indications: Patient is a  81 year old male with  Diabetes and a nonhealing ulceration of the right foot. Has known peripheral vascular disease and is status post previous intervention.. The patient has noninvasive study showing  Diffuse atherosclerosis with what appeared to be severe tibial disease and possible SFA and popliteal disease. The patient is brought in for angiography for further evaluation and potential treatment. Risks and benefits are discussed and informed consent is obtained  Procedure: The patient was identified and appropriate procedural time out was performed. The patient was then placed supine on the table and prepped and draped in the usual sterile fashion. Ultrasound was used to evaluate the  left common femoral artery. It was patent . A digital ultrasound image was acquired. A Seldinger needle was used to access the  left common femoral artery under direct ultrasound guidance and a permanent image was performed. A 0.035 J wire was advanced without resistance and a 5Fr  sheath was placed. Pigtail catheter was placed into the aorta and an AP aortogram was performed. This demonstrated normal renal arteries and normal aorta and iliac segments without significant stenosis. I then crossed the aortic bifurcation and advanced to the  right femoral head and into the proximal superficial femoral artery. Selective  right lower extremity angiogram was then performed. This demonstrated  SFA with a couple of areas of 20-40% stenosis which was not flow limiting. The popliteal artery below the knee had about an 80-85% stenosis. The posterior tibial artery was the dominant runoff but it had about a 75-80% stenosis in the distal calf area. The posterior tibial artery then had a large collateral that reconstituted the anterior tibial artery at the ankle. The peroneal artery was small. The patient was systemically heparinized and a 6 [place] Ansell sheath was then placed over the [employer] wire. I then used a Kumpe catheter and the advantage wire to  Navigate through the popliteal artery stenosis in the posterior tibial artery stenosis. To treat the posterior tibial artery, a 3 mm diameter by 10 cm length angioplasty balloon was inflated in the mid to distal posterior tibial artery area this was inflated to 10 atm for 1 minute. Completion angiogram showed only about a 10-15% residual stenosis. I then turned my attention to the popliteal artery stenosis. A 5 mm diameter by 15 cm length Lutonix drug-coated angioplasty balloon was used to treat the lesion. This was inflated to 12 atm for 1 minute. Completion angiogram following angioplasty showed markedly improved flow with about a 15% residual stenosis in the popliteal artery. At this point, his perfusion had been markedly improved. I elected to terminate the procedure. The sheath was removed and StarClose closure device was deployed in the left femoral artery with excellent hemostatic result. The patient was taken  to the recovery room in stable  condition having tolerated the procedure well.  Findings:  Aortogram: normal renal arteries, normal aorta and iliac segments Right Lower Extremity: <40% SFA stenosis, 80-85% below knee popliteal stenosis, PT artery runoff distally with 75-80% stenosis distally, peroneal artery small, AT with long segment occlusion   Disposition: Patient was taken to the recovery room in stable condition having tolerated the procedure well.  Complications: None  Eric [name] 06/27/2015 10:52 AM

## 2015-06-27 NOTE — OR Nursing (Signed)
Dr dew in to speak with pt and family, plan follow up in 3-4 weeks with ABI's

## 2015-06-27 NOTE — H&P (Signed)
  [facility] History & Physical Update  The patient was interviewed and re-examined.  The patient's previous History and Physical has been reviewed and is unchanged.  There is no change in the plan of care. We plan to proceed with the scheduled procedure.  Tahra Hitzeman, MD  06/27/2015, 8:12 AM

## 2015-06-28 ENCOUNTER — Encounter: Payer: Self-pay | Admitting: Vascular Surgery

## 2015-07-26 ENCOUNTER — Other Ambulatory Visit: Payer: Self-pay | Admitting: Pharmacist Clinician (PhC)/ Clinical Pharmacy Specialist

## 2015-07-26 MED ORDER — WARFARIN SODIUM 2 MG PO TABS: ORAL_TABLET | ORAL | Status: DC

## 2015-07-29 ENCOUNTER — Other Ambulatory Visit: Payer: Self-pay | Admitting: Cardiology

## 2015-07-29 NOTE — Telephone Encounter (Addendum)
°*  STAT* If patient is at the pharmacy, call can be transferred to refill team.   1. Which medications need to be refilled? (please list name of each medication and dose if known) Warfarin   2. Which pharmacy/location (including street and city if local pharmacy) is medication to be sent to? [facility] on [address] in [place]  3. Do they need a 30 day or 90 day supply? [place]

## 2015-07-30 ENCOUNTER — Encounter: Payer: Self-pay | Admitting: Podiatry

## 2015-07-30 ENCOUNTER — Other Ambulatory Visit: Payer: Self-pay

## 2015-07-30 ENCOUNTER — Ambulatory Visit (INDEPENDENT_AMBULATORY_CARE_PROVIDER_SITE_OTHER): Payer: PPO | Admitting: Podiatry

## 2015-07-30 DIAGNOSIS — M79676 Pain in unspecified toe(s): Secondary | ICD-10-CM

## 2015-07-30 DIAGNOSIS — E1151 Type 2 diabetes mellitus with diabetic peripheral angiopathy without gangrene: Secondary | ICD-10-CM

## 2015-07-30 DIAGNOSIS — Q828 Other specified congenital malformations of skin: Secondary | ICD-10-CM

## 2015-07-30 DIAGNOSIS — E0940 Drug or chemical induced diabetes mellitus with neurological complications with diabetic neuropathy, unspecified: Secondary | ICD-10-CM

## 2015-07-30 DIAGNOSIS — B351 Tinea unguium: Secondary | ICD-10-CM

## 2015-07-30 MED ORDER — WARFARIN SODIUM 2 MG PO TABS: ORAL_TABLET | ORAL | Status: DC

## 2015-07-30 NOTE — Telephone Encounter (Signed)
rx for 30 days was send into [facility], [facility] was already send in by Erasmo Downer (pharmacist) to [facility]

## 2015-07-30 NOTE — Progress Notes (Signed)
Patient ID: Eric [name]., male   DOB: 16-Sep-1927, 81 y.o.   MRN: [id number] Complaint:  Visit Type: Patient returns to my office for continued preventative foot care services. Complaint: Patient states" my nails have grown long and thick and become painful to walk and wear shoes" Patient has been diagnosed with DM with neuropathy.. The patient presents for preventative foot care services. No changes to ROS.  He has painful callus under the outside ball of both feet.  He has developed black discoloration extending from callus right foot.  Podiatric Exam: Vascular: dorsalis pedis and posterior tibial pulses are mildly palpable   bilateral. Capillary return is immediate. Temperature gradient is WNL. Skin turgor WNL  Sensorium: Absent  Semmes Weinstein monofilament test. Normal tactile sensation bilaterally. Nail Exam: Pt has thick disfigured discolored nails with subungual debris noted bilateral entire nail hallux through fifth toenails  Orthopedic Exam: Muscle tone and strength are WNL. No limitations in general ROM. No crepitus or effusions noted. Foot type and digits show no abnormalities. Bony prominences are unremarkable. Skin: Porokeratosis sub 5th metatarsal left foot.  There is blackened porokeratotic lesion sub 5th met right foot.  Diagnosis:  Onychomycosis, , Pain in right toe, pain in left toes,  Porokeratosis sub5th met B/L  Treatment & Plan Procedures and Treatment: Consent by patient was obtained for treatment procedures. The patient understood the discussion of treatment and procedures well. All questions were answered thoroughly reviewed. Debridement of mycotic and hypertrophic toenails, 1 through 5 bilateral and clearing of subungual debris. No ulceration, no infection noted.  Debridement of porokeratosis.  Reviewing 09/14 note, this patient had this same blackened porokeratosis right foot.  There appears to be a skin necrosis lateral to porokeratosis.  No fluctuance or drainage  noted.   Padding added to diabetic insole.  Patient asks to initiate diabetic shoe paperwork. Return Visit-Office Procedure: Patient instructed to return to the office for a follow up visit 10 weeks. for continued evaluation and treatment.    Gardiner Barefoot DPM

## 2015-07-31 MED ORDER — WARFARIN SODIUM 2 MG PO TABS: ORAL_TABLET | ORAL | Status: DC

## 2015-08-08 ENCOUNTER — Ambulatory Visit (INDEPENDENT_AMBULATORY_CARE_PROVIDER_SITE_OTHER): Payer: PPO | Admitting: Pharmacist

## 2015-08-08 DIAGNOSIS — I4892 Unspecified atrial flutter: Secondary | ICD-10-CM | POA: Diagnosis not present

## 2015-08-08 DIAGNOSIS — I4891 Unspecified atrial fibrillation: Secondary | ICD-10-CM

## 2015-08-08 DIAGNOSIS — Z7901 Long term (current) use of anticoagulants: Secondary | ICD-10-CM | POA: Diagnosis not present

## 2015-08-08 DIAGNOSIS — I482 Chronic atrial fibrillation, unspecified: Secondary | ICD-10-CM

## 2015-08-08 DIAGNOSIS — Z5181 Encounter for therapeutic drug level monitoring: Secondary | ICD-10-CM | POA: Diagnosis not present

## 2015-08-08 LAB — POCT INR: INR: 3.2

## 2015-08-23 ENCOUNTER — Encounter: Payer: Self-pay | Admitting: Family Medicine

## 2015-08-23 ENCOUNTER — Ambulatory Visit (INDEPENDENT_AMBULATORY_CARE_PROVIDER_SITE_OTHER): Payer: PPO | Admitting: Family Medicine

## 2015-08-23 VITALS — BP 130/74 | HR 56 | Temp 98.6°F | Resp 16 | Ht 65.0 in | Wt 186.0 lb

## 2015-08-23 DIAGNOSIS — E119 Type 2 diabetes mellitus without complications: Secondary | ICD-10-CM | POA: Diagnosis not present

## 2015-08-23 DIAGNOSIS — I2510 Atherosclerotic heart disease of native coronary artery without angina pectoris: Secondary | ICD-10-CM

## 2015-08-23 DIAGNOSIS — I10 Essential (primary) hypertension: Secondary | ICD-10-CM

## 2015-08-23 DIAGNOSIS — I739 Peripheral vascular disease, unspecified: Secondary | ICD-10-CM | POA: Diagnosis not present

## 2015-08-23 DIAGNOSIS — R7303 Prediabetes: Secondary | ICD-10-CM | POA: Diagnosis not present

## 2015-08-23 DIAGNOSIS — N183 Chronic kidney disease, stage 3 unspecified: Secondary | ICD-10-CM

## 2015-08-23 DIAGNOSIS — E785 Hyperlipidemia, unspecified: Secondary | ICD-10-CM | POA: Diagnosis not present

## 2015-08-23 LAB — CBC WITH DIFFERENTIAL/PLATELET
Basophils Absolute: 0.1 10*3/uL (ref 0.0–0.1)
Basophils Relative: 1 % (ref 0–1)
EOS ABS: 0.2 10*3/uL (ref 0.0–0.7)
EOS PCT: 2 % (ref 0–5)
HEMATOCRIT: 41.6 % (ref 39.0–52.0)
Hemoglobin: 13.4 g/dL (ref 13.0–17.0)
LYMPHS ABS: 2.4 10*3/uL (ref 0.7–4.0)
LYMPHS PCT: 28 % (ref 12–46)
MCH: 30.5 pg (ref 26.0–34.0)
MCHC: 32.2 g/dL (ref 30.0–36.0)
MCV: 94.5 fL (ref 78.0–100.0)
MONOS PCT: 6 % (ref 3–12)
MPV: 10.3 fL (ref 8.6–12.4)
Monocytes Absolute: 0.5 10*3/uL (ref 0.1–1.0)
Neutro Abs: 5.3 10*3/uL (ref 1.7–7.7)
Neutrophils Relative %: 63 % (ref 43–77)
Platelets: 182 10*3/uL (ref 150–400)
RBC: 4.4 MIL/uL (ref 4.22–5.81)
RDW: 14.9 % (ref 11.5–15.5)
WBC: 8.4 10*3/uL (ref 4.0–10.5)

## 2015-08-23 LAB — COMPLETE METABOLIC PANEL WITH GFR
ALK PHOS: 29 U/L — AB (ref 40–115)
ALT: 19 U/L (ref 9–46)
AST: 35 U/L (ref 10–35)
Albumin: 4.3 g/dL (ref 3.6–5.1)
BILIRUBIN TOTAL: 0.7 mg/dL (ref 0.2–1.2)
BUN: 25 mg/dL (ref 7–25)
CALCIUM: 10 mg/dL (ref 8.6–10.3)
CO2: 27 mmol/L (ref 20–31)
CREATININE: 1.57 mg/dL — AB (ref 0.70–1.11)
Chloride: 100 mmol/L (ref 98–110)
GFR, EST AFRICAN AMERICAN: 46 mL/min — AB (ref >=60)
GFR, EST NON AFRICAN AMERICAN: 40 mL/min — AB (ref >=60)
Glucose, Bld: 94 mg/dL (ref 70–99)
Potassium: 4.7 mmol/L (ref 3.5–5.3)
Sodium: 137 mmol/L (ref 135–146)
TOTAL PROTEIN: 7.2 g/dL (ref 6.1–8.1)

## 2015-08-23 LAB — LIPID PANEL
CHOLESTEROL: 137 mg/dL (ref 125–200)
HDL: 26 mg/dL — ABNORMAL LOW (ref >=40)
LDL Cholesterol: 81 mg/dL (ref <130)
TRIGLYCERIDES: 151 mg/dL — AB (ref <150)
Total CHOL/HDL Ratio: 5.3 Ratio — ABNORMAL HIGH (ref <=5.0)
VLDL: 30 mg/dL (ref <30)

## 2015-08-23 LAB — HEMOGLOBIN A1C
Hgb A1c MFr Bld: 6.5 % — ABNORMAL HIGH (ref <5.7)
Mean Plasma Glucose: 140 mg/dL — ABNORMAL HIGH (ref <117)

## 2015-08-23 NOTE — Progress Notes (Signed)
Subjective:    Patient ID: Eric [name]., male    DOB: [birth year], 82 y.o.   MRN: [id number]  HPI  05/06/15 Patient is a very pleasant 82 year old white male with a history of type 2 diabetes mellitus, chronic kidney disease stage III, hypertension, hyperlipidemia, hypertriglyceridemia, coronary artery disease status post CABG, and peripheral vascular disease. He is here today for follow-up of his chronic medical problems. He denies any chest pain shortness of breath or dyspnea on exertion. He denies any myalgias or right upper quadrant pain. He denies any polyuria, polydipsia, or blurred vision. He denies any hypoglycemic episodes. He denies significant claudication with ambulation. He is due for a flu shot as well as Prevnar 13. His Coumadin was checked at his cardiologist office. He denies any syncope or presyncope or tachycardia.  At that time, my plan was: I reviewed the patient's labs were significant for an LDL cholesterol of 88 and an HDL cholesterol 16. For that reason I will discontinue Lipitor and placed the patient on maximum dose Crestor 40 mg by mouth daily and recheck fasting lab work in 3 months. Hemoglobin A1c is 6.7 is acceptable. Blood pressure today is outstanding. Renal function is stable. Diabetic foot exam is normal. Recommended diabetic eye exam. Patient received a flu shot today as well as Prevnar 13.  08/23/15 He is here today for follow-up of his diabetes. He denies any myalgias or right upper quadrant pain. He denies any chest pain or shortness of breath. His blood pressure today is well controlled at 130/74. He denies any bleeding or bruising on Coumadin which is currently being monitored at his cardiologist's office. Immunizations are up-to-date. He is due to recheck a fasting lipid panel. At his last visit we discontinued Lipitor and replaced with Crestor to try to drive his LDL cholesterol below 70. Anti-coag visit on 08/08/2015  Component Date Value Ref Range  Status  . INR 08/08/2015 3.2   Final   Past Medical History  Diagnosis Date  . CAD (coronary artery disease)     cath 2003, occluded S-RCA, occluded S-Dx, L-LAD ok, s/p PTCA to LAD  . Atrial fibrillation ([place])   . Atrial flutter ([place])     s/p RFCA  . Long term (current) use of anticoagulants   . Pure hyperglyceridemia   . Unspecified essential hypertension   . Diabetes mellitus   . OSA (obstructive sleep apnea) 12/11    very mild, AHI 7/hr  . PVD (peripheral vascular disease) ([place])     angioplasty of his right lower extremity in [place] by Dr.[name] 2013  . Cataract   . Neuromuscular disorder [facility])    Past Surgical History  Procedure Laterality Date  . Nm myoview ltd  4/11    normal  . Angioplasty  1/99    CAD- diogonal with rotational artherectomy  . Carpal tunnel release      ? bilateral  . Cardiac catheterization  1/00  . Arthrectomy      of LAD & PTCA  . Coronary artery bypass graft  2000  . Ptca  4/03    admit unstable angina  . Cardioversion  1/04  . Cardioversion  5/07    hospital- a flutter  . Adenosine myoview  3/06    EF 56%, neg. Ischemia  . Adenosine myoview  02/18/07    nml  . Hand surgery  08/27/09    R thumb procedure wit Scaphoid Gragt and screws, Dr [name]  . Colonoscopy w/ biopsies  10/01/06  sigmoid polyp bx neg, 3 years  . Fracture surgery    . Peripheral vascular catheterization Right 06/27/2015    Procedure: Lower Extremity Angiography;  Surgeon: Algernon [name], MD;  Location: [facility];  Service: Cardiovascular;  Laterality: Right;  . Peripheral vascular catheterization  06/27/2015    Procedure: Lower Extremity Intervention;  Surgeon: Algernon [name], MD;  Location: [facility];  Service: Cardiovascular;;   Current Outpatient Prescriptions on File Prior to Visit  Medication Sig Dispense Refill  . acetaminophen (TYLENOL) 500 MG tablet Take 1,000 mg by mouth every 8 (eight) hours as needed for mild pain.     [name] amLODipine (NORVASC)  5 MG tablet Take 1 tablet (5 mg total) by mouth daily. 90 tablet 1  . atorvastatin (LIPITOR) 40 MG tablet Take 1 tablet (40 mg total) by mouth daily. 90 tablet 3  . B Complex Vitamins (B COMPLEX-B12) TABS Take 1 tablet by mouth daily.     . Blood Glucose Monitoring Suppl (ONE TOUCH ULTRA SYSTEM KIT) W/DEVICE KIT 1 kit by Does not apply route once. 1 each 0  . cholecalciferol (VITAMIN D) 1000 UNITS tablet Take 1,000 Units by mouth 2 (two) times a week. Takes on Mon and Fri    . CINNAMON PO Take by mouth 2 (two) times daily.     . fenofibrate 160 MG tablet Take 1 tablet (160 mg total) by mouth daily. 90 tablet 3  . fish oil-omega-3 fatty acids 1000 MG capsule Take 3 g by mouth daily.     . furosemide (LASIX) 20 MG tablet Take 1 tablet (20 mg total) by mouth daily. 90 tablet 3  . glucose blood test strip Use as instructed 100 each 3  . lisinopril (PRINIVIL,ZESTRIL) 40 MG tablet Take 1 tablet (40 mg total) by mouth daily. 90 tablet 3  . metoprolol (LOPRESSOR) 50 MG tablet Take 1 tablet (50 mg total) by mouth 2 (two) times daily. 180 tablet 3  . Multiple Vitamin (DAILY MULTIVITAMIN PO) Take 1 tablet by mouth daily.      [name] DELICA LANCETS FINE MISC 1 each by Does not apply route daily. 100 each 3  . rosuvastatin (CRESTOR) 40 MG tablet Take 1 tablet (40 mg total) by mouth daily. 90 tablet 3  . traMADol (ULTRAM) 50 MG tablet Take 1 tablet (50 mg total) by mouth every 12 (twelve) hours as needed for moderate pain. 20 tablet 0  . warfarin (COUMADIN) 2 MG tablet Take 1 tablet by mouth daily or as directed by coumadin clinic 30 tablet 0   No current facility-administered medications on file prior to visit.   Allergies  Allergen Reactions  . Isosorbide Mononitrate Other (See Comments)    Unknown   . Quinidine Gluconate Other (See Comments)    Unknown  . Ramipril Other (See Comments)    Unknown   Social History   Social History  . Marital Status: Married    Spouse Name: N/A  . Number of  Children: 5  . Years of Education: N/A   Occupational History  . [employer]    Social History Main Topics  . Smoking status: Never Smoker   . Smokeless tobacco: Former Systems developer  . Alcohol Use: No  . Drug Use: No  . Sexual Activity: No   Other Topics Concern  . Not on file   Social History Narrative   Retired now does some antiques   Retired from Theatre manager and dye work  Married 1951   5 kids       Review of Systems  All other systems reviewed and are negative.      Objective:   Physical Exam  Constitutional: He is oriented to person, place, and time. He appears well-developed and well-nourished. No distress.  HENT:  Mouth/Throat: No oropharyngeal exudate.  Eyes: Conjunctivae are normal.  Neck: Neck supple. No JVD present. No thyromegaly present.  Cardiovascular: Normal rate and normal heart sounds.  An irregularly irregular rhythm present. Exam reveals no gallop and no friction rub.   No murmur heard. Pulmonary/Chest: Effort normal and breath sounds normal. No respiratory distress. He has no wheezes. He has no rales.  Abdominal: Soft. Bowel sounds are normal. He exhibits no distension. There is no tenderness. There is no rebound and no guarding.  Musculoskeletal: He exhibits no edema.  Lymphadenopathy:    He has no cervical adenopathy.  Neurological: He is alert and oriented to person, place, and time. He has normal reflexes. No cranial nerve deficit.  Skin: He is not diaphoretic.  Vitals reviewed.         Assessment & Plan:  Prediabetes - Plan: CBC with Differential/Platelet, COMPLETE METABOLIC PANEL WITH GFR, Lipid panel, Hemoglobin A1c  HLD (hyperlipidemia)  Chronic kidney disease, stage III (moderate)  Coronary artery disease involving native coronary artery of native heart without angina pectoris  PVD (peripheral vascular disease) ([place])  Essential hypertension  Patient denies any symptoms of angina or unstable angina. His blood pressures well  controlled. I will check a fasting lipid panel. Goal LDL cholesterol is less than 70. He denies any claudication or leg pain. He denies any peripheral neuropathy. I will check a hemoglobin A1c. Diagnosis listed in assessment and plan is incorrect. It should state diabetes mellitus type 2 controlled by diet. I will also monitor his renal function. Immunizations are up-to-date. Patient is not due for any cancer screening due to his advanced age.

## 2015-08-27 ENCOUNTER — Encounter: Payer: Self-pay | Admitting: Family Medicine

## 2015-09-03 DIAGNOSIS — H02132 Senile ectropion of right lower eyelid: Secondary | ICD-10-CM | POA: Diagnosis not present

## 2015-09-03 DIAGNOSIS — H02135 Senile ectropion of left lower eyelid: Secondary | ICD-10-CM | POA: Diagnosis not present

## 2015-09-06 ENCOUNTER — Ambulatory Visit (INDEPENDENT_AMBULATORY_CARE_PROVIDER_SITE_OTHER): Payer: PPO | Admitting: Pharmacist

## 2015-09-06 DIAGNOSIS — Z7901 Long term (current) use of anticoagulants: Secondary | ICD-10-CM | POA: Diagnosis not present

## 2015-09-06 DIAGNOSIS — Z5181 Encounter for therapeutic drug level monitoring: Secondary | ICD-10-CM | POA: Diagnosis not present

## 2015-09-06 DIAGNOSIS — I4891 Unspecified atrial fibrillation: Secondary | ICD-10-CM | POA: Diagnosis not present

## 2015-09-06 DIAGNOSIS — I4892 Unspecified atrial flutter: Secondary | ICD-10-CM

## 2015-09-06 DIAGNOSIS — I482 Chronic atrial fibrillation, unspecified: Secondary | ICD-10-CM

## 2015-09-06 LAB — POCT INR: INR: 2.8

## 2015-09-26 ENCOUNTER — Encounter: Payer: Self-pay | Admitting: Physician Assistant

## 2015-09-26 ENCOUNTER — Ambulatory Visit (INDEPENDENT_AMBULATORY_CARE_PROVIDER_SITE_OTHER): Payer: PPO | Admitting: Physician Assistant

## 2015-09-26 VITALS — BP 140/70 | HR 68 | Ht 65.0 in | Wt 188.0 lb

## 2015-09-26 DIAGNOSIS — I4819 Other persistent atrial fibrillation: Secondary | ICD-10-CM

## 2015-09-26 DIAGNOSIS — I481 Persistent atrial fibrillation: Secondary | ICD-10-CM | POA: Diagnosis not present

## 2015-09-26 DIAGNOSIS — I480 Paroxysmal atrial fibrillation: Secondary | ICD-10-CM | POA: Diagnosis not present

## 2015-09-26 DIAGNOSIS — I10 Essential (primary) hypertension: Secondary | ICD-10-CM

## 2015-09-26 DIAGNOSIS — Z9229 Personal history of other drug therapy: Secondary | ICD-10-CM | POA: Insufficient documentation

## 2015-09-26 DIAGNOSIS — Z7901 Long term (current) use of anticoagulants: Secondary | ICD-10-CM

## 2015-09-26 DIAGNOSIS — I25810 Atherosclerosis of coronary artery bypass graft(s) without angina pectoris: Secondary | ICD-10-CM

## 2015-09-26 HISTORY — DX: Other persistent atrial fibrillation: I48.19

## 2015-09-26 NOTE — Progress Notes (Signed)
Cardiology Office Note   Date:  09/26/2015   ID:  Eric Hough., DOB 12/23/1927, MRN [id number]  PCP:  Odette Fraction, MD  Cardiologist:  Dr Alcide Evener, PA-C   Chief Complaint  Patient presents with  . Follow-up    occ chest pain, some shortness of breath, no swelling, occ cramping, no dizziness or lightheadedness    History of Present Illness: Eric Lasecki. is a 82 y.o. male with a history of DM, CKD III, HTN, HLD, CABG, Perm Afib on coumadin, PAD.  06/2015 had PV cath w/ PTA R-post tib & R-popliteal arteries  Eric Hough. presents for preop evaluation for eye surgery.  He follows his BP at home. It is high in the morning, frequently greater than 150. It gets better during the day, generally in the 130s, but occasionally as low as 101. He is not aware of the atrial fib, never gets palpitations, presyncope or syncope. His heart rate occasionally reads low on his home BP machine, but he is never aware of it.  He has not been having chest pain. He does not do stairs anymore at home (has a Chief Operating Officer), but is able to keep wood in a wood stove without problems. He is also able to walk distances on flat ground without problems.   He needs eye surgery to have a small lesion removed from his upper lid and possibly fix the ectropion of his lower lid. He will need to be off coumadin for this.  His wife is on Eliquis and he is interested in getting on something besides coumadin. He worries about the cost but will ask his pharmacist. They are currently affording Eliquis for his wife.   Past Medical History  Diagnosis Date  . CAD (coronary artery disease)     cath 2003, occluded S-RCA, occluded S-Dx, L-LAD ok, s/p PTCA to LAD  . Atrial fibrillation ([place])   . Atrial flutter ([place])     s/p RFCA  . Long term (current) use of anticoagulants   . Pure hyperglyceridemia   . Unspecified essential hypertension   . Diabetes mellitus   . OSA (obstructive  sleep apnea) 12/11    very mild, AHI 7/hr  . PVD (peripheral vascular disease) ([place])     angioplasty of his right lower extremity in [place] by Dr.[name] 2013  . Cataract   . Neuromuscular disorder [facility])     Past Surgical History  Procedure Laterality Date  . Nm myoview ltd  4/11    normal  . Angioplasty  1/99    CAD- diogonal with rotational artherectomy  . Carpal tunnel release      ? bilateral  . Cardiac catheterization  1/00  . Arthrectomy      of LAD & PTCA  . Coronary artery bypass graft  2000    LIMA-LAD, SVG-RCA, SVG-Diag; SVG-Diag & SVG-RCA occluded 2003  . Coronary angioplasty  4/03    cutting balloon PTCA pLAD into Diag  . Cardioversion  1/04  . Cardioversion  5/07    hospital- a flutter  . Adenosine myoview  3/06    EF 56%, neg. Ischemia  . Adenosine myoview  02/18/07    nml  . Hand surgery  08/27/09    R thumb procedure wit Scaphoid Gragt and screws, Dr [name]  . Colonoscopy w/ biopsies  10/01/06    sigmoid polyp bx neg, 3 years  . Fracture surgery    . Peripheral vascular catheterization Right  06/27/2015    Procedure: Lower Extremity Angiography;  Surgeon: Algernon [name], MD;  Location: [place] [facility];  Service: Cardiovascular;  Laterality: Right;  . Peripheral vascular catheterization  06/27/2015    Procedure: Lower Extremity Intervention;  Surgeon: Algernon [name], MD;  Location: [place] [facility];  Service: Cardiovascular;;    Current Outpatient Prescriptions  Medication Sig Dispense Refill  . acetaminophen (TYLENOL) 500 MG tablet Take 1,000 mg by mouth every 8 (eight) hours as needed for mild pain.     [name] amLODipine (NORVASC) 5 MG tablet Take 1 tablet (5 mg total) by mouth daily. 90 tablet 1  . atorvastatin (LIPITOR) 40 MG tablet Take 1 tablet (40 mg total) by mouth daily. 90 tablet 3  . B Complex Vitamins (B COMPLEX-B12) TABS Take 1 tablet by mouth daily.     . Blood Glucose Monitoring Suppl (ONE TOUCH ULTRA SYSTEM KIT) W/DEVICE KIT 1 kit by Does not  apply route once. 1 each 0  . cholecalciferol (VITAMIN D) 1000 UNITS tablet Take 1,000 Units by mouth 2 (two) times a week. Takes on Mon and Fri    . CINNAMON PO Take by mouth 2 (two) times daily.     . fenofibrate 160 MG tablet Take 1 tablet (160 mg total) by mouth daily. 90 tablet 3  . fish oil-omega-3 fatty acids 1000 MG capsule Take 3 g by mouth daily.     . furosemide (LASIX) 20 MG tablet Take 1 tablet (20 mg total) by mouth daily. 90 tablet 3  . glucose blood test strip Use as instructed 100 each 3  . lisinopril (PRINIVIL,ZESTRIL) 40 MG tablet Take 1 tablet (40 mg total) by mouth daily. 90 tablet 3  . metoprolol (LOPRESSOR) 50 MG tablet Take 1 tablet (50 mg total) by mouth 2 (two) times daily. 180 tablet 3  . Multiple Vitamin (DAILY MULTIVITAMIN PO) Take 1 tablet by mouth daily.      [name] DELICA LANCETS FINE MISC 1 each by Does not apply route daily. 100 each 3  . rosuvastatin (CRESTOR) 40 MG tablet Take 1 tablet (40 mg total) by mouth daily. 90 tablet 3  . traMADol (ULTRAM) 50 MG tablet Take 1 tablet (50 mg total) by mouth every 12 (twelve) hours as needed for moderate pain. 20 tablet 0  . warfarin (COUMADIN) 2 MG tablet Take 1 tablet by mouth daily or as directed by coumadin clinic 30 tablet 0   No current facility-administered medications for this visit.    Allergies:   Isosorbide mononitrate; Quinidine gluconate; and Ramipril    Social History:  The patient  reports that he has never smoked. He has quit using smokeless tobacco. He reports that he does not drink alcohol or use illicit drugs.   Family History:  The patient's family history includes Aneurysm in his father; Breast cancer in his sister; Hypertension in his father and mother; Liver cancer in his brother; Lung cancer in his brother and brother; Other in his brother and sister; Prostate cancer in his brother; Stroke in his father; Thrombosis in his brother.    ROS:  Please see the history of present illness. All  other systems are reviewed and negative.    PHYSICAL EXAM: VS:  BP 140/70 mmHg  Pulse 68  Ht _0  (1.651 m)  Wt 188 lb (85.276 kg)  BMI 31.28 kg/m2 , BMI Body mass index is 31.28 kg/(m^2). GEN: Well nourished, well developed, male in no acute distress HEENT: normal for  age with ectropion. Neck: no JVD, no carotid bruit, no masses Cardiac: Irreg R&R; no murmur, no rubs, or gallops Respiratory:  clear to auscultation bilaterally, normal work of breathing GI: soft, nontender, nondistended, + BS MS: no deformity or atrophy; no edema; distal pulses are 2+ in upper extremities; decreased but palpable in both LE, R>L  Skin: warm and dry, no rash Neuro:  Strength and sensation are intact Psych: euthymic mood, full affect   EKG:  EKG is ordered today. The ekg ordered today demonstrates atrial fib, rate 52, no acute changes   Recent Labs: 04/30/2015: TSH 3.428 08/23/2015: ALT 19; BUN 25; Creat 1.57*; Hemoglobin 13.4; Platelets 182; Potassium 4.7; Sodium 137    Lipid Panel    Component Value Date/Time   CHOL 137 08/23/2015 0905   TRIG 151* 08/23/2015 0905   HDL 26* 08/23/2015 0905   CHOLHDL 5.3* 08/23/2015 0905   VLDL 30 08/23/2015 0905   LDLCALC 81 08/23/2015 0905   LDLDIRECT 99.1 02/19/2011 0823     Wt Readings from Last 3 Encounters:  09/26/15 188 lb (85.276 kg)  08/23/15 186 lb (84.369 kg)  06/27/15 186 lb (84.369 kg)     Other studies Reviewed: Additional studies/ records that were reviewed today include: office visits, testing.  ASSESSMENT AND PLAN:  1.  CAD: no ischemic symptoms, MV 2015 was low risk w/ nl EF. Continue current therapy w/ BB, statin, ACE. No ASA 2nd coumadin.  2. Atrial fib: on BB, no change due to bradycardia. He has no sx, so no dose decrease needed  3. Chronic anticoagulation w/ coumadin: He is at acceptable risk to come off the coumadin for the surgery. As he will already be off coumadin, will schedule him an appointment with Cyril Mourning at that  time to review NOAC and see if he would want one.   4. HTN: his BP is consistently high in the morning, but better controlled by the afternoon. Change Norvasc to bedtime to try to get more even BP control.  Current medicines are reviewed at length with the patient today.  The patient does not have concerns regarding medicines.  The following changes have been made:  Norvasc at bedtime, no dose change  Labs/ tests ordered today include:   Orders Placed This Encounter  Procedures  . EKG 12-Lead     Disposition:   FU with Dr Stanford Breed  Signed, Lenoard Aden  09/26/2015 12:25 PM    [facility] [address], [place], [place]  [address] Phone: [phone]; Fax: [phone]

## 2015-09-26 NOTE — Patient Instructions (Signed)
Your physician has recommended you make the following change in your medication: if you are not already, take the amlodipine at night.  You have been cleared to have your surgery. Letter has been provided to you today.  Your physician recommends that you schedule a follow-up appointment on February 23 rd to see [facility] for medication change.  Your physician recommends that you schedule a follow-up appointment in: 3 months with Dr Stanford Breed.

## 2015-09-27 DIAGNOSIS — N183 Chronic kidney disease, stage 3 (moderate): Secondary | ICD-10-CM | POA: Diagnosis not present

## 2015-09-27 DIAGNOSIS — I4891 Unspecified atrial fibrillation: Secondary | ICD-10-CM | POA: Diagnosis not present

## 2015-09-27 DIAGNOSIS — Z01818 Encounter for other preprocedural examination: Secondary | ICD-10-CM | POA: Diagnosis not present

## 2015-09-27 DIAGNOSIS — G4733 Obstructive sleep apnea (adult) (pediatric): Secondary | ICD-10-CM | POA: Diagnosis not present

## 2015-09-27 DIAGNOSIS — I10 Essential (primary) hypertension: Secondary | ICD-10-CM | POA: Diagnosis not present

## 2015-09-27 DIAGNOSIS — I2510 Atherosclerotic heart disease of native coronary artery without angina pectoris: Secondary | ICD-10-CM | POA: Diagnosis not present

## 2015-10-03 ENCOUNTER — Ambulatory Visit (INDEPENDENT_AMBULATORY_CARE_PROVIDER_SITE_OTHER): Payer: PPO | Admitting: Pharmacist Clinician (PhC)/ Clinical Pharmacy Specialist

## 2015-10-03 DIAGNOSIS — I481 Persistent atrial fibrillation: Secondary | ICD-10-CM | POA: Diagnosis not present

## 2015-10-03 DIAGNOSIS — I4819 Other persistent atrial fibrillation: Secondary | ICD-10-CM

## 2015-10-03 MED ORDER — APIXABAN 2.5 MG PO TABS: 2.5000 mg | ORAL_TABLET | Freq: Two times a day (BID) | ORAL | Status: DC

## 2015-10-04 DIAGNOSIS — G4733 Obstructive sleep apnea (adult) (pediatric): Secondary | ICD-10-CM | POA: Diagnosis not present

## 2015-10-04 DIAGNOSIS — S01101A Unspecified open wound of right eyelid and periocular area, initial encounter: Secondary | ICD-10-CM | POA: Diagnosis not present

## 2015-10-04 DIAGNOSIS — S01102A Unspecified open wound of left eyelid and periocular area, initial encounter: Secondary | ICD-10-CM | POA: Diagnosis not present

## 2015-10-04 DIAGNOSIS — H02206 Unspecified lagophthalmos left eye, unspecified eyelid: Secondary | ICD-10-CM | POA: Diagnosis not present

## 2015-10-04 DIAGNOSIS — H02135 Senile ectropion of left lower eyelid: Secondary | ICD-10-CM | POA: Diagnosis not present

## 2015-10-04 DIAGNOSIS — H02203 Unspecified lagophthalmos right eye, unspecified eyelid: Secondary | ICD-10-CM | POA: Diagnosis not present

## 2015-10-04 DIAGNOSIS — Z951 Presence of aortocoronary bypass graft: Secondary | ICD-10-CM | POA: Diagnosis not present

## 2015-10-04 DIAGNOSIS — H02132 Senile ectropion of right lower eyelid: Secondary | ICD-10-CM | POA: Diagnosis not present

## 2015-10-04 DIAGNOSIS — Z79891 Long term (current) use of opiate analgesic: Secondary | ICD-10-CM | POA: Diagnosis not present

## 2015-10-04 DIAGNOSIS — I4891 Unspecified atrial fibrillation: Secondary | ICD-10-CM | POA: Diagnosis not present

## 2015-10-04 DIAGNOSIS — N400 Enlarged prostate without lower urinary tract symptoms: Secondary | ICD-10-CM | POA: Diagnosis not present

## 2015-10-04 DIAGNOSIS — N183 Chronic kidney disease, stage 3 (moderate): Secondary | ICD-10-CM | POA: Diagnosis not present

## 2015-10-04 DIAGNOSIS — I2510 Atherosclerotic heart disease of native coronary artery without angina pectoris: Secondary | ICD-10-CM | POA: Diagnosis not present

## 2015-10-04 DIAGNOSIS — I129 Hypertensive chronic kidney disease with stage 1 through stage 4 chronic kidney disease, or unspecified chronic kidney disease: Secondary | ICD-10-CM | POA: Diagnosis not present

## 2015-10-04 DIAGNOSIS — D492 Neoplasm of unspecified behavior of bone, soft tissue, and skin: Secondary | ICD-10-CM | POA: Diagnosis not present

## 2015-10-04 DIAGNOSIS — Z888 Allergy status to other drugs, medicaments and biological substances status: Secondary | ICD-10-CM | POA: Diagnosis not present

## 2015-10-04 DIAGNOSIS — B079 Viral wart, unspecified: Secondary | ICD-10-CM | POA: Diagnosis not present

## 2015-10-04 DIAGNOSIS — Z7901 Long term (current) use of anticoagulants: Secondary | ICD-10-CM | POA: Diagnosis not present

## 2015-10-04 DIAGNOSIS — E539 Vitamin B deficiency, unspecified: Secondary | ICD-10-CM | POA: Diagnosis not present

## 2015-10-04 DIAGNOSIS — E1122 Type 2 diabetes mellitus with diabetic chronic kidney disease: Secondary | ICD-10-CM | POA: Diagnosis not present

## 2015-10-04 DIAGNOSIS — E785 Hyperlipidemia, unspecified: Secondary | ICD-10-CM | POA: Diagnosis not present

## 2015-10-04 DIAGNOSIS — E11618 Type 2 diabetes mellitus with other diabetic arthropathy: Secondary | ICD-10-CM | POA: Diagnosis not present

## 2015-10-04 DIAGNOSIS — Z79899 Other long term (current) drug therapy: Secondary | ICD-10-CM | POA: Diagnosis not present

## 2015-10-05 ENCOUNTER — Ambulatory Visit (INDEPENDENT_AMBULATORY_CARE_PROVIDER_SITE_OTHER): Payer: PPO | Admitting: Pharmacist Clinician (PhC)/ Clinical Pharmacy Specialist

## 2015-10-05 DIAGNOSIS — I482 Chronic atrial fibrillation, unspecified: Secondary | ICD-10-CM

## 2015-10-05 DIAGNOSIS — Z5181 Encounter for therapeutic drug level monitoring: Secondary | ICD-10-CM

## 2015-10-05 DIAGNOSIS — I4892 Unspecified atrial flutter: Secondary | ICD-10-CM

## 2015-10-05 MED ORDER — APIXABAN 2.5 MG PO TABS: 2.5000 mg | ORAL_TABLET | Freq: Two times a day (BID) | ORAL | Status: DC

## 2015-10-05 NOTE — Progress Notes (Signed)
Patient off warfarin for upcoming eye surgery on Monday.  Had previously discusses switching to Eliquis, as his wife takes that, and figured this would be a good time to make that change.  Copay is around $90 for 90 day supply.  Pt was started on Eliquis for Tuesday Feb 28 for atrial fibrillation.    Reviewed patients medication list.  Pt is not currently on any combined P-gp and strong CYP3A4 inhibitors/inducers (ketoconazole, traconazole, ritonavir, carbamazepine, phenytoin, rifampin, St. John's wort).  Reviewed labs.  SCr 1.57, Weight 85.3 kg, CrCl- 41.5.  Dose appropriate based on CrCl.   Hgb and HCT within normal limits  A full discussion of the nature of anticoagulants has been carried out.  A benefit/risk analysis has been presented to the patient, so that they understand the justification for choosing anticoagulation with Eliquis at this time.  The need for compliance is stressed.  Pt is aware to take the medication twice daily.  Side effects of potential bleeding are discussed, including unusual colored urine or stools, coughing up blood or coffee ground emesis, nose bleeds or serious fall or head trauma.  Discussed signs and symptoms of stroke. The patient should avoid any OTC items containing aspirin or ibuprofen.  Avoid alcohol consumption.   Call if any signs of abnormal bleeding.  Discussed financial obligations and resolved any difficulty in obtaining medication.

## 2015-10-08 ENCOUNTER — Ambulatory Visit: Payer: PPO | Admitting: Podiatry

## 2015-10-08 ENCOUNTER — Ambulatory Visit: Payer: PPO | Admitting: Family Medicine

## 2015-10-10 ENCOUNTER — Encounter: Payer: Self-pay | Admitting: Cardiology

## 2015-10-10 ENCOUNTER — Telehealth: Payer: Self-pay | Admitting: Cardiology

## 2015-10-11 NOTE — Telephone Encounter (Signed)
Close encounter 

## 2015-10-14 ENCOUNTER — Encounter: Payer: Self-pay | Admitting: Pharmacist Clinician (PhC)/ Clinical Pharmacy Specialist

## 2015-10-18 ENCOUNTER — Encounter: Payer: Self-pay | Admitting: Podiatry

## 2015-10-18 ENCOUNTER — Ambulatory Visit (INDEPENDENT_AMBULATORY_CARE_PROVIDER_SITE_OTHER): Payer: PPO | Admitting: Podiatry

## 2015-10-18 DIAGNOSIS — Q828 Other specified congenital malformations of skin: Secondary | ICD-10-CM

## 2015-10-18 DIAGNOSIS — E1151 Type 2 diabetes mellitus with diabetic peripheral angiopathy without gangrene: Secondary | ICD-10-CM

## 2015-10-18 DIAGNOSIS — B351 Tinea unguium: Secondary | ICD-10-CM | POA: Diagnosis not present

## 2015-10-18 DIAGNOSIS — M79673 Pain in unspecified foot: Secondary | ICD-10-CM

## 2015-10-18 NOTE — Progress Notes (Signed)
Patient ID: Eric W Folse [name], male   DOB: 06/27/1928, 82 y.o.   MRN: [id number] Complaint:  Visit Type: Patient returns to my office for continued preventative foot care services. Complaint: Patient states" my nails have grown long and thick and become painful to walk and wear shoes" Patient has been diagnosed with DM with neuropathy.. The patient presents for preventative foot care services. No changes to ROS.  He has painful callus under the outside ball of both feet.  He has developed black discoloration extending from callus right foot.  Podiatric Exam: Vascular: dorsalis pedis and posterior tibial pulses are mildly palpable   bilateral. Capillary return is immediate. Temperature gradient is WNL. Skin turgor WNL  Sensorium: Absent  Semmes Weinstein monofilament test. Normal tactile sensation bilaterally. Nail Exam: Pt has thick disfigured discolored nails with subungual debris noted bilateral entire nail hallux through fifth toenails  Orthopedic Exam: Muscle tone and strength are WNL. No limitations in general ROM. No crepitus or effusions noted. Foot type and digits show no abnormalities. Bony prominences are unremarkable. Skin: Porokeratosis sub 5th metatarsal left foot.  There is blackened porokeratotic lesion sub 5th met right foot.  Diagnosis:  Onychomycosis, , Pain in right toe, pain in left toes,  Porokeratosis sub 5th met B/L  Treatment & Plan Procedures and Treatment: Consent by patient was obtained for treatment procedures. The patient understood the discussion of treatment and procedures well. All questions were answered thoroughly reviewed. Debridement of mycotic and hypertrophic toenails, 1 through 5 bilateral and clearing of subungual debris. No ulceration, no infection noted.  Debridement of porokeratosis.  Reviewing 09/14 note, this patient had this same blackened porokeratosis right foot. .  No fluctuance or drainage noted.    Return Visit-Office Procedure: Patient instructed  to return to the office for a follow up visit 10 weeks. for continued evaluation and treatment.    Gregory Mayer DPM 

## 2015-10-29 DIAGNOSIS — I70239 Atherosclerosis of native arteries of right leg with ulceration of unspecified site: Secondary | ICD-10-CM | POA: Diagnosis not present

## 2015-10-29 DIAGNOSIS — I739 Peripheral vascular disease, unspecified: Secondary | ICD-10-CM | POA: Diagnosis not present

## 2015-10-29 DIAGNOSIS — I10 Essential (primary) hypertension: Secondary | ICD-10-CM | POA: Diagnosis not present

## 2015-10-29 DIAGNOSIS — E119 Type 2 diabetes mellitus without complications: Secondary | ICD-10-CM | POA: Diagnosis not present

## 2015-10-29 DIAGNOSIS — L97511 Non-pressure chronic ulcer of other part of right foot limited to breakdown of skin: Secondary | ICD-10-CM | POA: Diagnosis not present

## 2015-10-29 DIAGNOSIS — I70235 Atherosclerosis of native arteries of right leg with ulceration of other part of foot: Secondary | ICD-10-CM | POA: Diagnosis not present

## 2015-11-05 ENCOUNTER — Other Ambulatory Visit: Payer: Self-pay | Admitting: Family Medicine

## 2015-11-05 MED ORDER — LISINOPRIL 40 MG PO TABS: 40.0000 mg | ORAL_TABLET | Freq: Every day | ORAL | Status: DC

## 2015-11-18 ENCOUNTER — Other Ambulatory Visit: Payer: Self-pay

## 2015-11-18 DIAGNOSIS — I10 Essential (primary) hypertension: Secondary | ICD-10-CM

## 2015-11-18 DIAGNOSIS — I739 Peripheral vascular disease, unspecified: Secondary | ICD-10-CM

## 2015-11-18 DIAGNOSIS — I482 Chronic atrial fibrillation, unspecified: Secondary | ICD-10-CM

## 2015-11-18 DIAGNOSIS — I4891 Unspecified atrial fibrillation: Secondary | ICD-10-CM

## 2015-11-18 DIAGNOSIS — I2510 Atherosclerotic heart disease of native coronary artery without angina pectoris: Secondary | ICD-10-CM

## 2015-11-18 MED ORDER — AMLODIPINE BESYLATE 5 MG PO TABS: 5.0000 mg | ORAL_TABLET | Freq: Every day | ORAL | Status: DC

## 2015-12-16 ENCOUNTER — Ambulatory Visit (INDEPENDENT_AMBULATORY_CARE_PROVIDER_SITE_OTHER): Payer: PPO | Admitting: Family Medicine

## 2015-12-16 ENCOUNTER — Other Ambulatory Visit: Payer: PPO

## 2015-12-16 ENCOUNTER — Other Ambulatory Visit: Payer: Self-pay | Admitting: Family Medicine

## 2015-12-16 ENCOUNTER — Encounter: Payer: Self-pay | Admitting: Family Medicine

## 2015-12-16 VITALS — BP 110/58 | HR 76 | Temp 97.6°F | Resp 14 | Wt 178.0 lb

## 2015-12-16 DIAGNOSIS — N400 Enlarged prostate without lower urinary tract symptoms: Secondary | ICD-10-CM | POA: Diagnosis not present

## 2015-12-16 DIAGNOSIS — Z79899 Other long term (current) drug therapy: Secondary | ICD-10-CM

## 2015-12-16 DIAGNOSIS — I10 Essential (primary) hypertension: Secondary | ICD-10-CM

## 2015-12-16 DIAGNOSIS — N410 Acute prostatitis: Secondary | ICD-10-CM

## 2015-12-16 DIAGNOSIS — R350 Frequency of micturition: Secondary | ICD-10-CM | POA: Diagnosis not present

## 2015-12-16 DIAGNOSIS — E119 Type 2 diabetes mellitus without complications: Secondary | ICD-10-CM | POA: Diagnosis not present

## 2015-12-16 DIAGNOSIS — E785 Hyperlipidemia, unspecified: Secondary | ICD-10-CM

## 2015-12-16 LAB — URINALYSIS, MICROSCOPIC ONLY
CASTS: NONE SEEN [LPF]
CRYSTALS: NONE SEEN [HPF]
Squamous Epithelial / LPF: NONE SEEN [HPF] (ref <=5)
YEAST: NONE SEEN [HPF]

## 2015-12-16 LAB — CBC WITH DIFFERENTIAL/PLATELET
BASOS PCT: 1 %
Basophils Absolute: 94 cells/uL (ref 0–200)
EOS PCT: 2 %
Eosinophils Absolute: 188 cells/uL (ref 15–500)
HCT: 40.2 % (ref 38.5–50.0)
Hemoglobin: 13 g/dL (ref 13.0–17.0)
Lymphocytes Relative: 43 %
Lymphs Abs: 4042 cells/uL — ABNORMAL HIGH (ref 850–3900)
MCH: 30.1 pg (ref 27.0–33.0)
MCHC: 32.3 g/dL (ref 32.0–36.0)
MCV: 93.1 fL (ref 80.0–100.0)
MONO ABS: 752 {cells}/uL (ref 200–950)
MONOS PCT: 8 %
MPV: 9.5 fL (ref 7.5–12.5)
Neutro Abs: 4324 cells/uL (ref 1500–7800)
Neutrophils Relative %: 46 %
Platelets: 218 10*3/uL (ref 140–400)
RBC: 4.32 MIL/uL (ref 4.20–5.80)
RDW: 16.4 % — AB (ref 11.0–15.0)
WBC: 9.4 10*3/uL (ref 3.8–10.8)

## 2015-12-16 LAB — URINALYSIS, ROUTINE W REFLEX MICROSCOPIC
Bilirubin Urine: NEGATIVE
GLUCOSE, UA: NEGATIVE
KETONES UR: NEGATIVE
NITRITE: NEGATIVE
PH: 6 (ref 5.0–8.0)
Specific Gravity, Urine: 1.015 (ref 1.001–1.035)

## 2015-12-16 MED ORDER — TAMSULOSIN HCL 0.4 MG PO CAPS: 0.4000 mg | ORAL_CAPSULE | Freq: Every day | ORAL | Status: DC

## 2015-12-16 MED ORDER — CIPROFLOXACIN HCL 500 MG PO TABS: 500.0000 mg | ORAL_TABLET | Freq: Two times a day (BID) | ORAL | Status: DC

## 2015-12-16 NOTE — Progress Notes (Signed)
Subjective:    Patient ID: Eric [name]., male    DOB: October 13, 1927, 82 y.o.   MRN: [id number]  HPI  Patient presents with 6 weeks of dysuria, urgency, and hesitancy. He reports 3-4 episodes of nocturia every evening. He reports weak dribbling stream. He denies any fever or chills. He is also due for fasting lab work pertaining to his diabetes. Past Medical History  Diagnosis Date  . CAD (coronary artery disease)     cath 2003, occluded S-RCA, occluded S-Dx, L-LAD ok, s/p PTCA to LAD  . Atrial fibrillation ([place])   . Atrial flutter ([place])     s/p RFCA  . Long term (current) use of anticoagulants   . Pure hyperglyceridemia   . Unspecified essential hypertension   . Diabetes mellitus   . OSA (obstructive sleep apnea) 12/11    very mild, AHI 7/hr  . PVD (peripheral vascular disease) ([place])     angioplasty of his right lower extremity in [place] by Dr.[name] 2013  . Cataract   . Neuromuscular disorder ([place])   . Persistent atrial fibrillation ([place]) 09/26/2015   Past Surgical History  Procedure Laterality Date  . Nm myoview ltd  4/11    normal  . Angioplasty  1/99    CAD- diogonal with rotational artherectomy  . Carpal tunnel release      ? bilateral  . Cardiac catheterization  1/00  . Arthrectomy      of LAD & PTCA  . Coronary artery bypass graft  2000    LIMA-LAD, SVG-RCA, SVG-Diag; SVG-Diag & SVG-RCA occluded 2003  . Coronary angioplasty  4/03    cutting balloon PTCA pLAD into Diag  . Cardioversion  1/04  . Cardioversion  5/07    hospital- a flutter  . Adenosine myoview  3/06    EF 56%, neg. Ischemia  . Adenosine myoview  02/18/07    nml  . Hand surgery  08/27/09    R thumb procedure wit Scaphoid Gragt and screws, Dr [name]  . Colonoscopy w/ biopsies  10/01/06    sigmoid polyp bx neg, 3 years  . Fracture surgery    . Peripheral vascular catheterization Right 06/27/2015    Procedure: Lower Extremity Angiography;  Surgeon: Algernon [name], MD;  Location: [place] [facility];  Service: Cardiovascular;  Laterality: Right;  . Peripheral vascular catheterization  06/27/2015    Procedure: Lower Extremity Intervention;  Surgeon: Algernon [name], MD;  Location: [place] [facility];  Service: Cardiovascular;;   Current Outpatient Prescriptions on File Prior to Visit  Medication Sig Dispense Refill  . acetaminophen (TYLENOL) 500 MG tablet Take 1,000 mg by mouth every 8 (eight) hours as needed for mild pain.     [name] amLODipine (NORVASC) 5 MG tablet Take 1 tablet (5 mg total) by mouth daily. 90 tablet 3  . apixaban (ELIQUIS) 2.5 MG TABS tablet Take 1 tablet (2.5 mg total) by mouth 2 (two) times daily. 60 tablet 2  . apixaban (ELIQUIS) 2.5 MG TABS tablet Take 1 tablet (2.5 mg total) by mouth 2 (two) times daily. 180 tablet 1  . atorvastatin (LIPITOR) 40 MG tablet Take 1 tablet (40 mg total) by mouth daily. 90 tablet 3  . B Complex Vitamins (B COMPLEX-B12) TABS Take 1 tablet by mouth daily.     . Blood Glucose Monitoring Suppl (ONE TOUCH ULTRA SYSTEM KIT) W/DEVICE KIT 1 kit by Does not apply route once. 1 each 0  . cholecalciferol (VITAMIN D) 1000 UNITS  tablet Take 1,000 Units by mouth 2 (two) times a week. Takes on Mon and Fri    . CINNAMON PO Take by mouth 2 (two) times daily.     . fenofibrate 160 MG tablet Take 1 tablet (160 mg total) by mouth daily. 90 tablet 3  . fish oil-omega-3 fatty acids 1000 MG capsule Take 3 g by mouth daily.     . furosemide (LASIX) 20 MG tablet Take 1 tablet (20 mg total) by mouth daily. 90 tablet 3  . glucose blood test strip Use as instructed 100 each 3  . lisinopril (PRINIVIL,ZESTRIL) 40 MG tablet Take 1 tablet (40 mg total) by mouth daily. 90 tablet 3  . metoprolol (LOPRESSOR) 50 MG tablet Take 1 tablet (50 mg total) by mouth 2 (two) times daily. 180 tablet 3  . Multiple Vitamin (DAILY MULTIVITAMIN PO) Take 1 tablet by mouth daily.      [name] DELICA LANCETS FINE MISC 1 each by Does not apply route daily. 100 each 3  . rosuvastatin  (CRESTOR) 40 MG tablet Take 1 tablet (40 mg total) by mouth daily. 90 tablet 3  . traMADol (ULTRAM) 50 MG tablet Take 1 tablet (50 mg total) by mouth every 12 (twelve) hours as needed for moderate pain. 20 tablet 0   No current facility-administered medications on file prior to visit.   Allergies  Allergen Reactions  . Isosorbide Mononitrate Other (See Comments)    Unknown   . Quinidine Gluconate Other (See Comments)    Unknown  . Ramipril Other (See Comments)    Unknown   Social History   Social History  . Marital Status: Married    Spouse Name: N/A  . Number of Children: 5  . Years of Education: N/A   Occupational History  . [employer]    Social History Main Topics  . Smoking status: Never Smoker   . Smokeless tobacco: Former Systems developer  . Alcohol Use: No  . Drug Use: No  . Sexual Activity: No   Other Topics Concern  . 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      Objective:   Physical Exam  Cardiovascular: Normal rate and normal heart sounds.   Pulmonary/Chest: Effort normal and breath sounds normal.  Genitourinary: Prostate is enlarged and tender.  Vitals reviewed.         Assessment & Plan:  Prostatitis, acute - Plan: PSA, tamsulosin (FLOMAX) 0.4 MG CAPS capsule, ciprofloxacin (CIPRO) 500 MG tablet, DISCONTINUED: ciprofloxacin (CIPRO) 500 MG tablet, DISCONTINUED: tamsulosin (FLOMAX) 0.4 MG CAPS capsule  Controlled type 2 diabetes mellitus without complication, without long-term current use of insulin (HCC) - Plan: CBC with Differential/Platelet, COMPLETE METABOLIC PANEL WITH GFR, Lipid panel, Hemoglobin A1c  Begin Flomax 0.4 mg by mouth daily at bedtime along with Cipro 500 mg by mouth twice a day for 10 days. Recheck here in 2 weeks. Low pressure is low, I will have the patient discontinue  amlodipine. While drawing lab work, I will check a PSA, CBC, CMP, fasting lipid panel, and hemoglobin A1c.

## 2015-12-17 LAB — COMPREHENSIVE METABOLIC PANEL
ALK PHOS: 32 U/L — AB (ref 40–115)
ALT: 20 U/L (ref 9–46)
AST: 35 U/L (ref 10–35)
Albumin: 3.6 g/dL (ref 3.6–5.1)
BILIRUBIN TOTAL: 0.8 mg/dL (ref 0.2–1.2)
BUN: 24 mg/dL (ref 7–25)
CO2: 26 mmol/L (ref 20–31)
Calcium: 9.5 mg/dL (ref 8.6–10.3)
Chloride: 104 mmol/L (ref 98–110)
Creat: 1.57 mg/dL — ABNORMAL HIGH (ref 0.70–1.11)
GLUCOSE: 137 mg/dL — AB (ref 70–99)
Potassium: 4.9 mmol/L (ref 3.5–5.3)
SODIUM: 138 mmol/L (ref 135–146)
Total Protein: 7.1 g/dL (ref 6.1–8.1)

## 2015-12-17 LAB — LIPID PANEL
CHOL/HDL RATIO: 11.4 ratio — AB (ref <=5.0)
Cholesterol: 114 mg/dL — ABNORMAL LOW (ref 125–200)
HDL: 10 mg/dL — ABNORMAL LOW (ref >=40)
LDL CALC: 57 mg/dL (ref <130)
Triglycerides: 235 mg/dL — ABNORMAL HIGH (ref <150)
VLDL: 47 mg/dL — AB (ref <30)

## 2015-12-17 LAB — HEMOGLOBIN A1C
Hgb A1c MFr Bld: 7.2 % — ABNORMAL HIGH (ref <5.7)
Mean Plasma Glucose: 160 mg/dL

## 2015-12-18 LAB — PSA: PSA: 0.7 ng/mL (ref <=4.00)

## 2015-12-23 ENCOUNTER — Encounter: Payer: Self-pay | Admitting: *Deleted

## 2015-12-23 NOTE — Progress Notes (Signed)
    HPI: FU atrial fibrillation, coronary artery disease. Patient is status post coronary artery bypassing graft in 2000 with a LIMA to the LAD, saphenous vein graft to the diagonal and saphenous vein graft to the PDA. Cardiac catheterization in 2003 revealed normal left main, occluded LAD after the first diagonal, 80% LAD leading into the diagonal, 50% ramus, 50% obtuse marginal 70% mid PDA and an 80% right coronary artery. The saphenous vein graft to the right coronary and diagonal were occluded. The LIMA to the LAD was patent. LV function was normal. Patient had PCI of the LAD at that time. Patient also with peripheral vascular disease and has had prior angioplasty of his right lower extremity in [place] 2013. He also has permanent atrial fibrillation. Last nuclear study in March of 2015 was not gated. There was a small amount of ischemia in the mid anterior wall. Since he was last seen He denies dyspnea, chest pain, palpitations or syncope. His blood pressure has been running low and his amlodipine was recently discontinued. He still states his systolic is occasionally 100 and even lower.  Current Outpatient Prescriptions  Medication Sig Dispense Refill  . acetaminophen (TYLENOL) 500 MG tablet Take 1,000 mg by mouth every 8 (eight) hours as needed for mild pain.     . apixaban (ELIQUIS) 2.5 MG TABS tablet Take 1 tablet (2.5 mg total) by mouth 2 (two) times daily. 60 tablet 2  . atorvastatin (LIPITOR) 40 MG tablet Take 1 tablet (40 mg total) by mouth daily. 90 tablet 3  . B Complex Vitamins (B COMPLEX-B12) TABS Take 1 tablet by mouth daily.     . Blood Glucose Monitoring Suppl (ONE TOUCH ULTRA SYSTEM KIT) W/DEVICE KIT 1 kit by Does not apply route once. 1 each 0  . cholecalciferol (VITAMIN D) 1000 UNITS tablet Take 1,000 Units by mouth 2 (two) times a week. Takes on Mon and Fri    . CINNAMON PO Take by mouth 2 (two) times daily.     . fenofibrate 160 MG tablet Take 1 tablet (160 mg total) by  mouth daily. 90 tablet 3  . fish oil-omega-3 fatty acids 1000 MG capsule Take 3 g by mouth daily.     . furosemide (LASIX) 20 MG tablet Take 1 tablet (20 mg total) by mouth daily. 90 tablet 3  . glucose blood test strip Use as instructed 100 each 3  . lisinopril (PRINIVIL,ZESTRIL) 40 MG tablet Take 1 tablet (40 mg total) by mouth daily. 90 tablet 3  . metoprolol (LOPRESSOR) 50 MG tablet Take 1 tablet (50 mg total) by mouth 2 (two) times daily. 180 tablet 3  . Multiple Vitamin (DAILY MULTIVITAMIN PO) Take 1 tablet by mouth daily.      . ONETOUCH DELICA LANCETS FINE MISC 1 each by Does not apply route daily. 100 each 3   No current facility-administered medications for this visit.     Past Medical History  Diagnosis Date  . CAD (coronary artery disease)     cath 2003, occluded S-RCA, occluded S-Dx, L-LAD ok, s/p PTCA to LAD  . Atrial fibrillation (HCC)   . Atrial flutter (HCC)     s/p RFCA  . Long term (current) use of anticoagulants   . Pure hyperglyceridemia   . Unspecified essential hypertension   . Diabetes mellitus   . OSA (obstructive sleep apnea) 12/11    very mild, AHI 7/hr  . PVD (peripheral vascular disease) (HCC)     angioplasty of his   right lower extremity in [place] by Dr.[name] 2013  . Cataract   . Neuromuscular disorder ([place])   . Persistent atrial fibrillation ([place]) 09/26/2015    Past Surgical History  Procedure Laterality Date  . Nm myoview ltd  4/11    normal  . Angioplasty  1/99    CAD- diogonal with rotational artherectomy  . Carpal tunnel release      ? bilateral  . Cardiac catheterization  1/00  . Arthrectomy      of LAD & PTCA  . Coronary artery bypass graft  2000    LIMA-LAD, SVG-RCA, SVG-Diag; SVG-Diag & SVG-RCA occluded 2003  . Coronary angioplasty  4/03    cutting balloon PTCA pLAD into Diag  . Cardioversion  1/04  . Cardioversion  5/07    hospital- a flutter  . Adenosine myoview  3/06    EF 56%, neg. Ischemia  . Adenosine myoview  02/18/07      nml  . Hand surgery  08/27/09    R thumb procedure wit Scaphoid Gragt and screws, Dr [name]  . Colonoscopy w/ biopsies  10/01/06    sigmoid polyp bx neg, 3 years  . Fracture surgery    . Peripheral vascular catheterization Right 06/27/2015    Procedure: Lower Extremity Angiography;  Surgeon: Algernon [name], MD;  Location: [facility];  Service: Cardiovascular;  Laterality: Right;  . Peripheral vascular catheterization  06/27/2015    Procedure: Lower Extremity Intervention;  Surgeon: Algernon [name], MD;  Location: [facility];  Service: Cardiovascular;;    Social History   Social History  . Marital Status: Married    Spouse Name: N/A  . Number of Children: 5  . Years of Education: N/A   Occupational History  . [employer]    Social History Main Topics  . Smoking status: Never Smoker   . Smokeless tobacco: Former Systems developer  . Alcohol Use: No  . Drug Use: No  . Sexual Activity: No   Other Topics Concern  . Not on file   Social History Narrative   Retired now does some antiques   Retired from Theatre manager and dye work   Married 1951   5 kids    Family History  Problem Relation Age of Onset  . Stroke Father   . Aneurysm Father   . Hypertension Father   . Prostate cancer Brother   . Hypertension Mother   . Lung cancer Brother     smoker  . Thrombosis Brother   . Other Brother     RF valve disorder (smoker)  . Lung cancer Brother     smoker  . Breast cancer Sister   . Liver cancer Brother   . Other Sister     cerebral hemorrhage    ROS: no fevers or chills, productive cough, hemoptysis, dysphasia, odynophagia, melena, hematochezia, dysuria, hematuria, rash, seizure activity, orthopnea, PND, pedal edema, claudication. Remaining systems are negative.  Physical Exam: Well-developed well-nourished in no acute distress.  Skin is warm and dry.  HEENT is normal.  Neck is supple.  Chest is clear to auscultation with normal expansion.  Cardiovascular exam is  irregular Abdominal exam nontender or distended. No masses palpated. Extremities show no edema. neuro grossly intact

## 2015-12-24 ENCOUNTER — Ambulatory Visit (INDEPENDENT_AMBULATORY_CARE_PROVIDER_SITE_OTHER): Payer: PPO | Admitting: Family Medicine

## 2015-12-24 ENCOUNTER — Encounter: Payer: Self-pay | Admitting: Family Medicine

## 2015-12-24 VITALS — BP 114/82 | HR 64 | Temp 97.6°F | Resp 18 | Wt 181.0 lb

## 2015-12-24 DIAGNOSIS — N410 Acute prostatitis: Secondary | ICD-10-CM | POA: Diagnosis not present

## 2015-12-24 NOTE — Progress Notes (Signed)
Subjective:    Patient ID: Eric Hough., male    DOB: 1928/08/09, 82 y.o.   MRN: [id number]  HPI  12/16/15 Patient presents with 6 weeks of dysuria, urgency, and hesitancy. He reports 3-4 episodes of nocturia every evening. He reports weak dribbling stream. He denies any fever or chills. He is also due for fasting lab work pertaining to his diabetes.  At that time, my plan was: Begin Flomax 0.4 mg by mouth daily at bedtime along with Cipro 500 mg by mouth twice a day for 10 days. Recheck here in 2 weeks. Low pressure is low, I will have the patient discontinue amlodipine. While drawing lab work, I will check a PSA, CBC, CMP, fasting lipid panel, and hemoglobin A1c.  12/24/15 Patient is doing much better today. He denies any urinary hesitancy. He denies any urinary retention. He denies any dribbling or dysuria. Past Medical History  Diagnosis Date  . CAD (coronary artery disease)     cath 2003, occluded S-RCA, occluded S-Dx, L-LAD ok, s/p PTCA to LAD  . Atrial fibrillation ([place])   . Atrial flutter ([place])     s/p RFCA  . Long term (current) use of anticoagulants   . Pure hyperglyceridemia   . Unspecified essential hypertension   . Diabetes mellitus   . OSA (obstructive sleep apnea) 12/11    very mild, AHI 7/hr  . PVD (peripheral vascular disease) ([place])     angioplasty of his right lower extremity in [place] by Dr.[name] 2013  . Cataract   . Neuromuscular disorder ([place])   . Persistent atrial fibrillation ([place]) 09/26/2015   Past Surgical History  Procedure Laterality Date  . Nm myoview ltd  4/11    normal  . Angioplasty  1/99    CAD- diogonal with rotational artherectomy  . Carpal tunnel release      ? bilateral  . Cardiac catheterization  1/00  . Arthrectomy      of LAD & PTCA  . Coronary artery bypass graft  2000    LIMA-LAD, SVG-RCA, SVG-Diag; SVG-Diag & SVG-RCA occluded 2003  . Coronary angioplasty  4/03    cutting balloon PTCA pLAD into Diag  . Cardioversion  1/04    . Cardioversion  5/07    hospital- a flutter  . Adenosine myoview  3/06    EF 56%, neg. Ischemia  . Adenosine myoview  02/18/07    nml  . Hand surgery  08/27/09    R thumb procedure wit Scaphoid Gragt and screws, Dr [name]  . Colonoscopy w/ biopsies  10/01/06    sigmoid polyp bx neg, 3 years  . Fracture surgery    . Peripheral vascular catheterization Right 06/27/2015    Procedure: Lower Extremity Angiography;  Surgeon: Algernon [name], MD;  Location: [facility];  Service: Cardiovascular;  Laterality: Right;  . Peripheral vascular catheterization  06/27/2015    Procedure: Lower Extremity Intervention;  Surgeon: Algernon [name], MD;  Location: [facility];  Service: Cardiovascular;;   Current Outpatient Prescriptions on File Prior to Visit  Medication Sig Dispense Refill  . acetaminophen (TYLENOL) 500 MG tablet Take 1,000 mg by mouth every 8 (eight) hours as needed for mild pain.     [name] apixaban (ELIQUIS) 2.5 MG TABS tablet Take 1 tablet (2.5 mg total) by mouth 2 (two) times daily. 60 tablet 2  . atorvastatin (LIPITOR) 40 MG tablet Take 1 tablet (40 mg total) by mouth daily. 90 tablet 3  .  B Complex Vitamins (B COMPLEX-B12) TABS Take 1 tablet by mouth daily.     . Blood Glucose Monitoring Suppl (ONE TOUCH ULTRA SYSTEM KIT) W/DEVICE KIT 1 kit by Does not apply route once. 1 each 0  . cholecalciferol (VITAMIN D) 1000 UNITS tablet Take 1,000 Units by mouth 2 (two) times a week. Takes on Mon and Fri    . CINNAMON PO Take by mouth 2 (two) times daily.     . ciprofloxacin (CIPRO) 500 MG tablet Take 1 tablet (500 mg total) by mouth 2 (two) times daily. 20 tablet 0  . fenofibrate 160 MG tablet Take 1 tablet (160 mg total) by mouth daily. 90 tablet 3  . fish oil-omega-3 fatty acids 1000 MG capsule Take 3 g by mouth daily.     . furosemide (LASIX) 20 MG tablet Take 1 tablet (20 mg total) by mouth daily. 90 tablet 3  . glucose blood test strip Use as instructed 100 each 3  . lisinopril  (PRINIVIL,ZESTRIL) 40 MG tablet Take 1 tablet (40 mg total) by mouth daily. 90 tablet 3  . metoprolol (LOPRESSOR) 50 MG tablet Take 1 tablet (50 mg total) by mouth 2 (two) times daily. 180 tablet 3  . Multiple Vitamin (DAILY MULTIVITAMIN PO) Take 1 tablet by mouth daily.      [name] DELICA LANCETS FINE MISC 1 each by Does not apply route daily. 100 each 3  . rosuvastatin (CRESTOR) 40 MG tablet Take 1 tablet (40 mg total) by mouth daily. 90 tablet 3  . tamsulosin (FLOMAX) 0.4 MG CAPS capsule Take 1 capsule (0.4 mg total) by mouth daily. 30 capsule 3  . traMADol (ULTRAM) 50 MG tablet Take 1 tablet (50 mg total) by mouth every 12 (twelve) hours as needed for moderate pain. 20 tablet 0   No current facility-administered medications on file prior to visit.   Allergies  Allergen Reactions  . Isosorbide Mononitrate Other (See Comments)    Unknown   . Quinidine Gluconate Other (See Comments)    Unknown  . Ramipril Other (See Comments)    Unknown   Social History   Social History  . Marital Status: Married    Spouse Name: N/A  . Number of Children: 5  . Years of Education: N/A   Occupational History  . [employer]    Social History Main Topics  . Smoking status: Never Smoker   . Smokeless tobacco: Former Systems developer  . Alcohol Use: No  . Drug Use: No  . Sexual Activity: No   Other Topics Concern  . 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      Objective:   Physical Exam  Cardiovascular: Normal rate and normal heart sounds.   Pulmonary/Chest: Effort normal and breath sounds normal.  Genitourinary: Prostate is enlarged and tender.  Vitals reviewed.         Assessment & Plan:  Acute prostatitis Symptomatically resolved. Complete Cipro. Finish a full one-month prescription for Flomax and discontinue the  Flomax. Follow up if symptoms return

## 2015-12-25 ENCOUNTER — Other Ambulatory Visit: Payer: Self-pay | Admitting: *Deleted

## 2015-12-25 MED ORDER — FUROSEMIDE 20 MG PO TABS: 20.0000 mg | ORAL_TABLET | Freq: Every day | ORAL | Status: DC

## 2015-12-25 NOTE — Telephone Encounter (Signed)
Received fax requesting refill on lasix.   Refill appropriate and filled per protocol.

## 2015-12-27 ENCOUNTER — Encounter: Payer: Self-pay | Admitting: Cardiology

## 2015-12-27 ENCOUNTER — Ambulatory Visit (INDEPENDENT_AMBULATORY_CARE_PROVIDER_SITE_OTHER): Payer: PPO | Admitting: Cardiology

## 2015-12-27 ENCOUNTER — Ambulatory Visit: Payer: PPO | Admitting: Cardiology

## 2015-12-27 VITALS — BP 116/64 | HR 55 | Ht 65.0 in | Wt 179.0 lb

## 2015-12-27 DIAGNOSIS — I4819 Other persistent atrial fibrillation: Secondary | ICD-10-CM

## 2015-12-27 DIAGNOSIS — I10 Essential (primary) hypertension: Secondary | ICD-10-CM | POA: Diagnosis not present

## 2015-12-27 DIAGNOSIS — I2510 Atherosclerotic heart disease of native coronary artery without angina pectoris: Secondary | ICD-10-CM | POA: Diagnosis not present

## 2015-12-27 DIAGNOSIS — I2583 Coronary atherosclerosis due to lipid rich plaque: Secondary | ICD-10-CM

## 2015-12-27 DIAGNOSIS — I481 Persistent atrial fibrillation: Secondary | ICD-10-CM

## 2015-12-27 DIAGNOSIS — I739 Peripheral vascular disease, unspecified: Secondary | ICD-10-CM | POA: Diagnosis not present

## 2015-12-27 DIAGNOSIS — E781 Pure hyperglyceridemia: Secondary | ICD-10-CM

## 2015-12-27 MED ORDER — LISINOPRIL 40 MG PO TABS: 20.0000 mg | ORAL_TABLET | Freq: Every day | ORAL | Status: DC

## 2015-12-27 NOTE — Assessment & Plan Note (Signed)
Continue present medications. 

## 2015-12-27 NOTE — Assessment & Plan Note (Signed)
- 

## 2015-12-27 NOTE — Assessment & Plan Note (Signed)
Continue statin. No aspirin given the for anticoagulation.

## 2015-12-27 NOTE — Patient Instructions (Signed)
DECREASE TO 20 MG  FROM 40 MG -- 1/2 TABLET OF LINSINOPRIL  NO OTHER CHANGES AT PRESENT TIME   Your physician wants you to follow-up in [place].  You will receive a reminder letter in the mail two months in advance. If you don't receive a letter, please call our office to schedule the follow-up appointment.  If you need a refill on your cardiac medications before your next appointment, please call your pharmacy.

## 2015-12-27 NOTE — Assessment & Plan Note (Signed)
Blood pressure is running low.Change lisinopril to 20 mg daily and follow.

## 2015-12-27 NOTE — Assessment & Plan Note (Signed)
Continue statin. 

## 2016-01-03 ENCOUNTER — Ambulatory Visit (INDEPENDENT_AMBULATORY_CARE_PROVIDER_SITE_OTHER): Payer: PPO | Admitting: Podiatry

## 2016-01-03 DIAGNOSIS — E1140 Type 2 diabetes mellitus with diabetic neuropathy, unspecified: Secondary | ICD-10-CM

## 2016-01-03 DIAGNOSIS — L84 Corns and callosities: Secondary | ICD-10-CM | POA: Diagnosis not present

## 2016-01-03 DIAGNOSIS — M2040 Other hammer toe(s) (acquired), unspecified foot: Secondary | ICD-10-CM | POA: Diagnosis not present

## 2016-01-03 DIAGNOSIS — E1159 Type 2 diabetes mellitus with other circulatory complications: Secondary | ICD-10-CM

## 2016-01-03 NOTE — Progress Notes (Signed)
Patient ID: Eric Franca., male   DOB: [birth year], 82 y.o.   MRN: [id number] Patient presents for diabetic shoe pick up, shoes are tried on for good fit.  Patient received 1 Pair Apex B3000M Ambulatory single Velcro black in men's size 8 wide and 3 pairs custom molded diabetic inserts with bilateral 5th met head pockets.  Verbal and written break in and wear instructions given.  This patient presents to the office to pick up his diabetic shoes.  He has diabetic neuropathy with angiopathy  Dorsalis pedis and posterior tibial pulses are mildly palpable.  CR WNL  Cold feet Semmes wienstein diminished.Muscle power WNL Patient has blackened porokeratotic lesion   Diabetic neuropathy with angiopathy.   Porokeratosis  TX.  Dispense diabetic shoes.  Patient presents today and was dispensed 0ne pair ( two units) of medically necessary extra depth shoes with three pair( six units) of custom molded multiple density inserts. The shoes and the inserts are fitted to the patients ' feet and are noted to fit well and are free of defect.  Length and width of the shoes are also acceptable.  Patient was given written and verbal  instructions for wearing.  If any concerns arrive with the shoes or inserts, the patient is to call the office.Patient is to follow up with doctor in six weeks.    Gardiner Barefoot DPM

## 2016-01-03 NOTE — Patient Instructions (Signed)

## 2016-01-23 DIAGNOSIS — H2513 Age-related nuclear cataract, bilateral: Secondary | ICD-10-CM | POA: Diagnosis not present

## 2016-01-23 LAB — HM DIABETES EYE EXAM

## 2016-01-24 ENCOUNTER — Encounter: Payer: Self-pay | Admitting: Podiatry

## 2016-01-24 ENCOUNTER — Ambulatory Visit (INDEPENDENT_AMBULATORY_CARE_PROVIDER_SITE_OTHER): Payer: PPO | Admitting: Podiatry

## 2016-01-24 VITALS — BP 153/76 | HR 64 | Resp 14

## 2016-01-24 DIAGNOSIS — M79676 Pain in unspecified toe(s): Secondary | ICD-10-CM | POA: Diagnosis not present

## 2016-01-24 DIAGNOSIS — E1140 Type 2 diabetes mellitus with diabetic neuropathy, unspecified: Secondary | ICD-10-CM

## 2016-01-24 DIAGNOSIS — L84 Corns and callosities: Secondary | ICD-10-CM

## 2016-01-24 DIAGNOSIS — B351 Tinea unguium: Secondary | ICD-10-CM | POA: Diagnosis not present

## 2016-01-24 NOTE — Progress Notes (Signed)
Patient ID: Eric Rion., male   DOB: 24-Dec-1927, 82 y.o.   MRN: [id number] Complaint:  Visit Type: Patient returns to my office for continued preventative foot care services. Complaint: Patient states" my nails have grown long and thick and become painful to walk and wear shoes" Patient has been diagnosed with DM with neuropathy.. The patient presents for preventative foot care services. No changes to ROS.  He has painful callus under the outside ball of both feet.  He has picked up his diabetic shoes and has been wearing the shoes without problems.  He has drainage on his sock left foot which he was unaware of being there.  Podiatric Exam: Vascular: dorsalis pedis and posterior tibial pulses are mildly palpable   bilateral. Capillary return is immediate. Temperature gradient is WNL. Skin turgor WNL  Sensorium: Absent  Semmes Weinstein monofilament test. Normal tactile sensation bilaterally. Nail Exam: Pt has thick disfigured discolored nails with subungual debris noted bilateral entire nail hallux through fifth toenails  Orthopedic Exam: Muscle tone and strength are WNL. No limitations in general ROM. No crepitus or effusions noted. Foot type and digits show no abnormalities. Bony prominences are unremarkable. Skin: Porokeratosis sub 5th metatarsal left foot.  There is porokeratotic lesion sub 5th met right foot. Distal clavi 3rd toe left foot with small ulceration noted.  Diagnosis:  Onychomycosis, , Pain in right toe, pain in left toes,  Porokeratosis sub 5th met B/L. Distal clavi 3rd toe left foot.  Treatment & Plan Procedures and Treatment: Consent by patient was obtained for treatment procedures. The patient understood the discussion of treatment and procedures well. All questions were answered thoroughly reviewed. Debridement of mycotic and hypertrophic toenails, 1 through 5 bilateral and clearing of subungual debris. No ulceration, no infection noted.  Debridement of porokeratosis.   .   No fluctuance or drainage noted.  Debride distal clavi.  Silvadene/DSD.  Digital pad on 3rd toe left foot.   Return Visit-Office Procedure: Patient instructed to return to the office for a follow up visit 10 weeks. for continued evaluation and treatment.    Gardiner Barefoot DPM

## 2016-02-07 ENCOUNTER — Other Ambulatory Visit: Payer: Self-pay | Admitting: Family Medicine

## 2016-02-07 DIAGNOSIS — I482 Chronic atrial fibrillation, unspecified: Secondary | ICD-10-CM

## 2016-02-07 MED ORDER — METOPROLOL TARTRATE 50 MG PO TABS: 50.0000 mg | ORAL_TABLET | Freq: Two times a day (BID) | ORAL | Status: DC

## 2016-03-13 ENCOUNTER — Encounter: Payer: Self-pay | Admitting: Family Medicine

## 2016-03-13 ENCOUNTER — Ambulatory Visit (INDEPENDENT_AMBULATORY_CARE_PROVIDER_SITE_OTHER): Payer: PPO | Admitting: Family Medicine

## 2016-03-13 VITALS — BP 126/64 | HR 68 | Temp 97.5°F | Resp 14 | Wt 182.0 lb

## 2016-03-13 DIAGNOSIS — R300 Dysuria: Secondary | ICD-10-CM | POA: Diagnosis not present

## 2016-03-13 DIAGNOSIS — R1031 Right lower quadrant pain: Secondary | ICD-10-CM | POA: Diagnosis not present

## 2016-03-13 LAB — URINALYSIS, MICROSCOPIC ONLY

## 2016-03-13 LAB — URINALYSIS, ROUTINE W REFLEX MICROSCOPIC
BILIRUBIN URINE: NEGATIVE
Glucose, UA: NEGATIVE
Ketones, ur: NEGATIVE
LEUKOCYTES UA: NEGATIVE
NITRITE: NEGATIVE
PROTEIN: NEGATIVE
Specific Gravity, Urine: 1.02 (ref 1.001–1.035)
pH: 6.5 (ref 5.0–8.0)

## 2016-03-13 NOTE — Progress Notes (Signed)
Subjective:    Patient ID: Eric [name]., male    DOB: 1928-03-07, 82 y.o.   MRN: [id number]  HPI Patient reports a three-week history of pain in the right lower quadrant of his abdomen. The pain is actually in his pannus at the very superior portion of the right inguinal canal. He is extremely sensitive to touch in that area and a superficial fashion. Patient states that it hurts when he does Valsalva maneuver. It hurts when he bears down hard to cough. It hurts to get up and move or to bend over. The pain is sharp. It is certainly exacerbated by movement and twisting. He denies any hematuria or dysuria. He denies any fevers or chills. He denies any change in his bowel habits. He denies any blood in his stool or black tarry stools. The pain certainly sounds muscular in nature. I'm suspicious that he may have put on his lower abdominal muscle and be developing a direct inguinal hernia. However on his exam I cannot palpate any hernia Past Medical History:  Diagnosis Date  . Atrial fibrillation ([place])   . Atrial flutter ([place])    s/p RFCA  . CAD (coronary artery disease)    cath 2003, occluded S-RCA, occluded S-Dx, L-LAD ok, s/p PTCA to LAD  . Cataract   . Diabetes mellitus   . Long term (current) use of anticoagulants   . Neuromuscular disorder ([place])   . OSA (obstructive sleep apnea) 12/11   very mild, AHI 7/hr  . Persistent atrial fibrillation ([place]) 09/26/2015  . Pure hyperglyceridemia   . PVD (peripheral vascular disease) ([place])    angioplasty of his right lower extremity in [place] by Dr.[name] 2013  . Unspecified essential hypertension    Past Surgical History:  Procedure Laterality Date  . Adenosine Myoview  3/06   EF 56%, neg. Ischemia  . Adenosine Myoview  02/18/07   nml  . ANGIOPLASTY  1/99   CAD- diogonal with rotational artherectomy  . Arthrectomy     of LAD & PTCA  . CARDIAC CATHETERIZATION  1/00  . CARDIOVERSION  1/04  . CARDIOVERSION  5/07   hospital- a flutter    . CARPAL TUNNEL RELEASE     ? bilateral  . COLONOSCOPY W/ BIOPSIES  10/01/06   sigmoid polyp bx neg, 3 years  . CORONARY ANGIOPLASTY  4/03   cutting balloon PTCA pLAD into Diag  . CORONARY ARTERY BYPASS GRAFT  2000   LIMA-LAD, SVG-RCA, SVG-Diag; SVG-Diag & SVG-RCA occluded 2003  . FRACTURE SURGERY    . HAND SURGERY  08/27/09   R thumb procedure wit Scaphoid Gragt and screws, Dr [name]  . NM MYOVIEW LTD  4/11   normal  . PERIPHERAL VASCULAR CATHETERIZATION Right 06/27/2015   Procedure: Lower Extremity Angiography;  Surgeon: Algernon [name], MD;  Location: [facility];  Service: Cardiovascular;  Laterality: Right;  . PERIPHERAL VASCULAR CATHETERIZATION  06/27/2015   Procedure: Lower Extremity Intervention;  Surgeon: Algernon [name], MD;  Location: [facility];  Service: Cardiovascular;;   Current Outpatient Prescriptions on File Prior to Visit  Medication Sig Dispense Refill  . acetaminophen (TYLENOL) 500 MG tablet Take 1,000 mg by mouth every 8 (eight) hours as needed for mild pain.     [name] apixaban (ELIQUIS) 2.5 MG TABS tablet Take 1 tablet (2.5 mg total) by mouth 2 (two) times daily. 60 tablet 2  . atorvastatin (LIPITOR) 40 MG tablet Take 1 tablet (40 mg total) by  mouth daily. 90 tablet 3  . B Complex Vitamins (B COMPLEX-B12) TABS Take 1 tablet by mouth daily.     . Blood Glucose Monitoring Suppl (ONE TOUCH ULTRA SYSTEM KIT) W/DEVICE KIT 1 kit by Does not apply route once. 1 each 0  . cholecalciferol (VITAMIN D) 1000 UNITS tablet Take 1,000 Units by mouth 2 (two) times a week. Takes on Mon and Fri    . CINNAMON PO Take by mouth 2 (two) times daily.     . fenofibrate 160 MG tablet Take 1 tablet (160 mg total) by mouth daily. 90 tablet 3  . fish oil-omega-3 fatty acids 1000 MG capsule Take 3 g by mouth daily.     . furosemide (LASIX) 20 MG tablet Take 1 tablet (20 mg total) by mouth daily. 90 tablet 3  . glucose blood test strip Use as instructed 100 each 3  . lisinopril  (PRINIVIL,ZESTRIL) 40 MG tablet Take 0.5 tablets (20 mg total) by mouth daily. 90 tablet 3  . metoprolol (LOPRESSOR) 50 MG tablet Take 1 tablet (50 mg total) by mouth 2 (two) times daily. 180 tablet 3  . Multiple Vitamin (DAILY MULTIVITAMIN PO) Take 1 tablet by mouth daily.      [name] DELICA LANCETS FINE MISC 1 each by Does not apply route daily. 100 each 3   No current facility-administered medications on file prior to visit.    Allergies  Allergen Reactions  . Isosorbide Mononitrate Other (See Comments)    Unknown   . Quinidine Gluconate Other (See Comments)    Unknown  . Ramipril Other (See Comments)    Unknown   Social History   Social History  . Marital status: Married    Spouse name: N/A  . Number of children: 5  . Years of education: N/A   Occupational History  . [employer] Retired   Social History Main Topics  . Smoking status: Never Smoker  . Smokeless tobacco: Former Systems developer  . Alcohol use No  . Drug use: No  . Sexual activity: No   Other Topics Concern  . 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      Objective:   Physical Exam  Cardiovascular: Normal rate, regular rhythm and normal heart sounds.   Pulmonary/Chest: Breath sounds normal. No respiratory distress. He has no wheezes. He has no rales.  Abdominal: Soft. He exhibits no distension and no mass. There is tenderness. There is no rebound and no guarding. Hernia confirmed negative in the right inguinal area and confirmed negative in the left inguinal area.  Genitourinary: Testes normal.    Right testis shows no mass and no tenderness. Left testis shows no mass.  Lymphadenopathy:       Right: No inguinal adenopathy present.       Left: No inguinal adenopathy present.  Vitals reviewed.  location of the pain is marked with a red  "X"        Assessment & Plan:  Dysuria - Plan: Urinalysis, Routine w reflex microscopic (not at [facility])  RLQ abdominal pain  The patient's pain is very superficial in nature and certainly seems to be in the lower abdominal muscle. His history sounds like he may have poor and that muscle and be developing  a direct inguinal hernia. However there are no physical exam signs of an inguinal hernia. Therefore I recommended tincture of time for the next 2-3 weeks. Pain gradually improves, given his advanced age, I would recommend no further workup. If pain persists, I would reevaluate the patient. If there is no palpable hernia at that point, I recommend a CT scan to verify. Even if he has a hernia we may not operate to fix a given his age I think it would be important to confirm that that is in fact what it is versus just a simple muscle pull in that area is a pathologic process

## 2016-04-02 ENCOUNTER — Telehealth: Payer: Self-pay | Admitting: Family Medicine

## 2016-04-02 NOTE — Telephone Encounter (Signed)
Ms. Quenneville called asking if Jakim's A1C can be checked during his appointment here tomorrow. She said he only checks his blood sugar levels once a day and usually in the morning it's around 130 or so. She said he wasn't placed on Metformin due to his history of renal issues. She'd like a phone call regarding this if he will or won't be able to have the A1C checked tomorrow.   [facility] ph# [phone] Thank you.

## 2016-04-03 ENCOUNTER — Ambulatory Visit (INDEPENDENT_AMBULATORY_CARE_PROVIDER_SITE_OTHER): Payer: PPO | Admitting: Family Medicine

## 2016-04-03 ENCOUNTER — Encounter: Payer: Self-pay | Admitting: Family Medicine

## 2016-04-03 ENCOUNTER — Encounter: Payer: Self-pay | Admitting: Podiatry

## 2016-04-03 ENCOUNTER — Other Ambulatory Visit: Payer: Self-pay | Admitting: Family Medicine

## 2016-04-03 ENCOUNTER — Ambulatory Visit (INDEPENDENT_AMBULATORY_CARE_PROVIDER_SITE_OTHER): Payer: PPO | Admitting: Podiatry

## 2016-04-03 VITALS — BP 140/70 | HR 58 | Temp 97.4°F | Resp 14 | Wt 179.0 lb

## 2016-04-03 DIAGNOSIS — E1140 Type 2 diabetes mellitus with diabetic neuropathy, unspecified: Secondary | ICD-10-CM

## 2016-04-03 DIAGNOSIS — R1031 Right lower quadrant pain: Secondary | ICD-10-CM

## 2016-04-03 DIAGNOSIS — M79676 Pain in unspecified toe(s): Secondary | ICD-10-CM | POA: Diagnosis not present

## 2016-04-03 DIAGNOSIS — Q828 Other specified congenital malformations of skin: Secondary | ICD-10-CM | POA: Diagnosis not present

## 2016-04-03 DIAGNOSIS — E119 Type 2 diabetes mellitus without complications: Secondary | ICD-10-CM | POA: Diagnosis not present

## 2016-04-03 DIAGNOSIS — B351 Tinea unguium: Secondary | ICD-10-CM

## 2016-04-03 LAB — COMPLETE METABOLIC PANEL WITH GFR
ALT: 17 U/L (ref 9–46)
AST: 33 U/L (ref 10–35)
Albumin: 4 g/dL (ref 3.6–5.1)
Alkaline Phosphatase: 24 U/L — ABNORMAL LOW (ref 40–115)
BUN: 22 mg/dL (ref 7–25)
CALCIUM: 10.2 mg/dL (ref 8.6–10.3)
CHLORIDE: 101 mmol/L (ref 98–110)
CO2: 23 mmol/L (ref 20–31)
Creat: 1.42 mg/dL — ABNORMAL HIGH (ref 0.70–1.11)
GFR, EST AFRICAN AMERICAN: 52 mL/min — AB (ref >=60)
GFR, Est Non African American: 45 mL/min — ABNORMAL LOW (ref >=60)
Glucose, Bld: 83 mg/dL (ref 70–99)
POTASSIUM: 4.7 mmol/L (ref 3.5–5.3)
Sodium: 136 mmol/L (ref 135–146)
Total Bilirubin: 0.8 mg/dL (ref 0.2–1.2)
Total Protein: 7.1 g/dL (ref 6.1–8.1)

## 2016-04-03 LAB — CBC WITH DIFFERENTIAL/PLATELET
BASOS ABS: 104 {cells}/uL (ref 0–200)
BASOS PCT: 1 %
EOS PCT: 2 %
Eosinophils Absolute: 208 cells/uL (ref 15–500)
HCT: 41.2 % (ref 38.5–50.0)
Hemoglobin: 13.7 g/dL (ref 13.0–17.0)
Lymphocytes Relative: 37 %
Lymphs Abs: 3848 cells/uL (ref 850–3900)
MCH: 31 pg (ref 27.0–33.0)
MCHC: 33.3 g/dL (ref 32.0–36.0)
MCV: 93.2 fL (ref 80.0–100.0)
MONOS PCT: 6 %
MPV: 10.8 fL (ref 7.5–12.5)
Monocytes Absolute: 624 cells/uL (ref 200–950)
NEUTROS ABS: 5616 {cells}/uL (ref 1500–7800)
Neutrophils Relative %: 54 %
PLATELETS: 196 10*3/uL (ref 140–400)
RBC: 4.42 MIL/uL (ref 4.20–5.80)
RDW: 14.7 % (ref 11.0–15.0)
WBC: 10.4 10*3/uL (ref 3.8–10.8)

## 2016-04-03 NOTE — Progress Notes (Signed)
Subjective:    Patient ID: Eric [name]., male    DOB: 1928/06/26, 82 y.o.   MRN: [id number]  HPI  03/13/16 Patient reports a three-week history of pain in the right lower quadrant of his abdomen. The pain is actually in his pannus at the very superior portion of the right inguinal canal. He is extremely sensitive to touch in that area and a superficial fashion. Patient states that it hurts when he does Valsalva maneuver. It hurts when he bears down hard to cough. It hurts to get up and move or to bend over. The pain is sharp. It is certainly exacerbated by movement and twisting. He denies any hematuria or dysuria. He denies any fevers or chills. He denies any change in his bowel habits. He denies any blood in his stool or black tarry stools. The pain certainly sounds muscular in nature. I'm suspicious that he may have put on his lower abdominal muscle and be developing a direct inguinal hernia. However on his exam I cannot palpate any hernia.  At that time, my plan was: The patient's pain is very superficial in nature and certainly seems to be in the lower abdominal muscle. His history sounds like he may have strained a muscle or be developing a direct inguinal hernia. However there are no physical exam signs of an inguinal hernia. Therefore I recommended tincture of time for the next 2-3 weeks. Pain gradually improves, given his advanced age, I would recommend no further workup. If pain persists, I would reevaluate the patient. If there is no palpable hernia at that point, I recommend a CT scan to verify. Even if he has a hernia we may not operate to fix a given his age I think it would be important to confirm that that is in fact what it is versus just a simple muscle pull in that area is a pathologic process  04/03/16 Patient is here today for follow-up. He states that the pain in the right lower quadrant is getting worse. He is now tender to palpation even gentle palpation in the right lower  quadrant. He denies any nausea or vomiting or constipation. He denies any blood in his stools. He denies any fevers or chills. He is nontoxic. He does not appear ill. However he walks very gingerly because the pain in his right lower quadrant. He has no pain with internal or external rotation of the right hip. He has a negative FABER/FADIR.  However he literally jumps on the exam table palpate in that area. There is no palpable inguinal hernia. There is no visible or palpable mass in that area Past Medical History:  Diagnosis Date  . Atrial fibrillation ([place])   . Atrial flutter ([place])    s/p RFCA  . CAD (coronary artery disease)    cath 2003, occluded S-RCA, occluded S-Dx, L-LAD ok, s/p PTCA to LAD  . Cataract   . Diabetes mellitus   . Long term (current) use of anticoagulants   . Neuromuscular disorder ([place])   . OSA (obstructive sleep apnea) 12/11   very mild, AHI 7/hr  . Persistent atrial fibrillation ([place]) 09/26/2015  . Pure hyperglyceridemia   . PVD (peripheral vascular disease) ([place])    angioplasty of his right lower extremity in [place] by Dr.[name] 2013  . Unspecified essential hypertension    Past Surgical History:  Procedure Laterality Date  . Adenosine Myoview  3/06   EF 56%, neg. Ischemia  . Adenosine Myoview  02/18/07   nml  .  ANGIOPLASTY  1/99   CAD- diogonal with rotational artherectomy  . Arthrectomy     of LAD & PTCA  . CARDIAC CATHETERIZATION  1/00  . CARDIOVERSION  1/04  . CARDIOVERSION  5/07   hospital- a flutter  . CARPAL TUNNEL RELEASE     ? bilateral  . COLONOSCOPY W/ BIOPSIES  10/01/06   sigmoid polyp bx neg, 3 years  . CORONARY ANGIOPLASTY  4/03   cutting balloon PTCA pLAD into Diag  . CORONARY ARTERY BYPASS GRAFT  2000   LIMA-LAD, SVG-RCA, SVG-Diag; SVG-Diag & SVG-RCA occluded 2003  . FRACTURE SURGERY    . HAND SURGERY  08/27/09   R thumb procedure wit Scaphoid Gragt and screws, Dr [name]  . NM MYOVIEW LTD  4/11   normal  . PERIPHERAL VASCULAR  CATHETERIZATION Right 06/27/2015   Procedure: Lower Extremity Angiography;  Surgeon: Algernon [name], MD;  Location: [facility];  Service: Cardiovascular;  Laterality: Right;  . PERIPHERAL VASCULAR CATHETERIZATION  06/27/2015   Procedure: Lower Extremity Intervention;  Surgeon: Algernon [name], MD;  Location: [facility];  Service: Cardiovascular;;   Current Outpatient Prescriptions on File Prior to Visit  Medication Sig Dispense Refill  . acetaminophen (TYLENOL) 500 MG tablet Take 1,000 mg by mouth every 8 (eight) hours as needed for mild pain.     [name] apixaban (ELIQUIS) 2.5 MG TABS tablet Take 1 tablet (2.5 mg total) by mouth 2 (two) times daily. 60 tablet 2  . atorvastatin (LIPITOR) 40 MG tablet Take 1 tablet (40 mg total) by mouth daily. 90 tablet 3  . B Complex Vitamins (B COMPLEX-B12) TABS Take 1 tablet by mouth daily.     . Blood Glucose Monitoring Suppl (ONE TOUCH ULTRA SYSTEM KIT) W/DEVICE KIT 1 kit by Does not apply route once. 1 each 0  . cholecalciferol (VITAMIN D) 1000 UNITS tablet Take 1,000 Units by mouth 2 (two) times a week. Takes on Mon and Fri    . CINNAMON PO Take by mouth 2 (two) times daily.     . fenofibrate 160 MG tablet Take 1 tablet (160 mg total) by mouth daily. 90 tablet 3  . fish oil-omega-3 fatty acids 1000 MG capsule Take 3 g by mouth daily.     . furosemide (LASIX) 20 MG tablet Take 1 tablet (20 mg total) by mouth daily. 90 tablet 3  . glucose blood test strip Use as instructed 100 each 3  . lisinopril (PRINIVIL,ZESTRIL) 40 MG tablet Take 0.5 tablets (20 mg total) by mouth daily. 90 tablet 3  . metoprolol (LOPRESSOR) 50 MG tablet Take 1 tablet (50 mg total) by mouth 2 (two) times daily. 180 tablet 3  . Multiple Vitamin (DAILY MULTIVITAMIN PO) Take 1 tablet by mouth daily.      [name] DELICA LANCETS FINE MISC 1 each by Does not apply route daily. 100 each 3   No current facility-administered medications on file prior to visit.    Allergies    Allergen Reactions  . Isosorbide Mononitrate Other (See Comments)    Unknown   . Quinidine Gluconate Other (See Comments)    Unknown  . Ramipril Other (See Comments)    Unknown   Social History   Social History  . Marital status: Married    Spouse name: N/A  . Number of children: 5  . Years of education: N/A   Occupational History  . [employer] Retired   Social History Main Topics  .  Smoking status: Never Smoker  . Smokeless tobacco: Former Systems developer  . Alcohol use No  . Drug use: No  . Sexual activity: No   Other Topics Concern  . 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      Objective:   Physical Exam  Cardiovascular: Normal rate, regular rhythm and normal heart sounds.   Pulmonary/Chest: Breath sounds normal. No respiratory distress. He has no wheezes. He has no rales.  Abdominal: Soft. He exhibits no distension and no mass. There is tenderness. There is no rebound and no guarding. Hernia confirmed negative in the right inguinal area and confirmed negative in the left inguinal area.  Genitourinary: Testes normal.    Right testis shows no mass and no tenderness. Left testis shows no mass.  Lymphadenopathy:       Right: No inguinal adenopathy present.       Left: No inguinal adenopathy present.  Vitals reviewed.  location of the pain is marked with a red "X"        Assessment & Plan:  Controlled type 2 diabetes mellitus without complication, without long-term current use of insulin ([place]) - Plan: COMPLETE METABOLIC PANEL WITH GFR, Hemoglobin A1c, CBC with Differential/Platelet  RLQ abdominal pain - Plan: CT Abdomen Pelvis Wo Contrast  This could certainly be muscular however the symptoms have been persisting 6 weeks and now seem to be getting worse. The pain is worsening. This point I believe  we need to get imaging of the right lower quadrant to evaluate further to ensure no pathologic process. I doubt appendicitis given the chronicity. I will arrange a CT scan without contrast given the patient's chronic kidney disease. Patient is also requesting lab work was here. In May his hemoglobin A1c was acceptable at 7.2. His cholesterol was good. I will repeat his hemoglobin A1c today.

## 2016-04-03 NOTE — Addendum Note (Signed)
Addended by: Shary Decamp B on: 04/03/2016 04:01 PM   Modules accepted: Orders

## 2016-04-03 NOTE — Telephone Encounter (Signed)
Pt's wife aware that we can do a1c today at [facility]

## 2016-04-03 NOTE — Progress Notes (Signed)
Patient ID: [name]., male   DOB: 1928/07/25, 82 y.o.   MRN: [id number] Complaint:  Visit Type: Patient returns to my office for continued preventative foot care services. Complaint: Patient states" my nails have grown long and thick and become painful to walk and wear shoes" Patient has been diagnosed with DM with neuropathy.. The patient presents for preventative foot care services. No changes to ROS.  He has painful callus under the outside ball of both feet.  He has picked up his diabetic shoes and has been wearing the shoes without problems.    Podiatric Exam: Vascular: dorsalis pedis and posterior tibial pulses are mildly palpable   bilateral. Capillary return is immediate. Temperature gradient is WNL. Skin turgor WNL  Sensorium: Absent  Semmes Weinstein monofilament test. Normal tactile sensation bilaterally. Nail Exam: Pt has thick disfigured discolored nails with subungual debris noted bilateral entire nail hallux through fifth toenails  Orthopedic Exam: Muscle tone and strength are WNL. No limitations in general ROM. No crepitus or effusions noted. Foot type and digits show no abnormalities. Bony prominences are unremarkable. Skin: Porokeratosis sub 5th metatarsal left foot.  There is porokeratotic lesion sub 5th met right foot. Distal clavi 3rd toe left foot .  Diagnosis:  Onychomycosis, , Pain in right toe, pain in left toes,  Porokeratosis sub 5th met B/L. Distal clavi 3rd toe left foot.  Treatment & Plan Procedures and Treatment: Consent by patient was obtained for treatment procedures. The patient understood the discussion of treatment and procedures well. All questions were answered thoroughly reviewed. Debridement of mycotic and hypertrophic toenails, 1 through 5 bilateral and clearing of subungual debris. No ulceration, no infection noted.  Debridement of porokeratosis and clavi.  Return Visit-Office Procedure: Patient instructed to return to the office for a follow up  visit 10 weeks. for continued evaluation and treatment.    Gardiner Barefoot DPM

## 2016-04-04 LAB — HEMOGLOBIN A1C
HEMOGLOBIN A1C: 6.2 % — AB (ref <5.7)
MEAN PLASMA GLUCOSE: 131 mg/dL

## 2016-04-06 ENCOUNTER — Encounter: Payer: Self-pay | Admitting: *Deleted

## 2016-04-09 ENCOUNTER — Other Ambulatory Visit: Payer: Self-pay | Admitting: Family Medicine

## 2016-04-09 DIAGNOSIS — R1031 Right lower quadrant pain: Secondary | ICD-10-CM

## 2016-04-15 ENCOUNTER — Ambulatory Visit: Payer: PPO | Admitting: Physician Assistant

## 2016-04-21 ENCOUNTER — Ambulatory Visit
Admission: RE | Admit: 2016-04-21 | Discharge: 2016-04-21 | Disposition: A | Discharge status: Home / Self Care | Payer: PPO | Source: Ambulatory Visit | Attending: Physician Assistant | Admitting: Physician Assistant

## 2016-04-21 DIAGNOSIS — R1031 Right lower quadrant pain: Secondary | ICD-10-CM

## 2016-04-21 DIAGNOSIS — K5790 Diverticulosis of intestine, part unspecified, without perforation or abscess without bleeding: Secondary | ICD-10-CM | POA: Diagnosis not present

## 2016-04-23 ENCOUNTER — Encounter: Payer: Self-pay | Admitting: Family Medicine

## 2016-04-29 ENCOUNTER — Other Ambulatory Visit: Payer: Self-pay | Admitting: Family Medicine

## 2016-04-29 MED ORDER — FENOFIBRATE 160 MG PO TABS: 160.0000 mg | ORAL_TABLET | Freq: Every day | ORAL | 3 refills | Status: DC

## 2016-04-29 NOTE — Telephone Encounter (Signed)
Medication called/sent to requested pharmacy  

## 2016-05-01 ENCOUNTER — Other Ambulatory Visit: Payer: Self-pay | Admitting: Cardiology

## 2016-05-01 NOTE — Telephone Encounter (Signed)
SCr now less than 1.5, though all previous have been >1.5.   He is now technically a candidate for 5mg  BID Eliquis (Scr<1.5, Wt>60kg). Will leave on current dose for now and follow trend of Scr.

## 2016-05-06 ENCOUNTER — Other Ambulatory Visit: Payer: Self-pay | Admitting: Family Medicine

## 2016-05-06 DIAGNOSIS — I70239 Atherosclerosis of native arteries of right leg with ulceration of unspecified site: Secondary | ICD-10-CM | POA: Diagnosis not present

## 2016-05-06 DIAGNOSIS — I70235 Atherosclerosis of native arteries of right leg with ulceration of other part of foot: Secondary | ICD-10-CM | POA: Diagnosis not present

## 2016-05-06 DIAGNOSIS — M79609 Pain in unspecified limb: Secondary | ICD-10-CM | POA: Diagnosis not present

## 2016-05-06 DIAGNOSIS — L97511 Non-pressure chronic ulcer of other part of right foot limited to breakdown of skin: Secondary | ICD-10-CM | POA: Diagnosis not present

## 2016-05-06 DIAGNOSIS — I739 Peripheral vascular disease, unspecified: Secondary | ICD-10-CM | POA: Diagnosis not present

## 2016-05-06 DIAGNOSIS — E785 Hyperlipidemia, unspecified: Secondary | ICD-10-CM | POA: Diagnosis not present

## 2016-05-06 DIAGNOSIS — E119 Type 2 diabetes mellitus without complications: Secondary | ICD-10-CM | POA: Diagnosis not present

## 2016-05-06 DIAGNOSIS — I10 Essential (primary) hypertension: Secondary | ICD-10-CM | POA: Diagnosis not present

## 2016-05-06 MED ORDER — GLUCOSE BLOOD VI STRP: ORAL_STRIP | 3 refills | Status: DC

## 2016-05-20 ENCOUNTER — Other Ambulatory Visit: Payer: Self-pay | Admitting: Family Medicine

## 2016-05-20 MED ORDER — ROSUVASTATIN CALCIUM 40 MG PO TABS: 40.0000 mg | ORAL_TABLET | Freq: Every day | ORAL | 3 refills | Status: DC

## 2016-05-20 NOTE — Telephone Encounter (Signed)
Medication called/sent to requested pharmacy  

## 2016-06-02 ENCOUNTER — Ambulatory Visit (INDEPENDENT_AMBULATORY_CARE_PROVIDER_SITE_OTHER): Payer: PPO

## 2016-06-02 DIAGNOSIS — Z23 Encounter for immunization: Secondary | ICD-10-CM

## 2016-06-19 ENCOUNTER — Ambulatory Visit (INDEPENDENT_AMBULATORY_CARE_PROVIDER_SITE_OTHER): Payer: PPO | Admitting: Podiatry

## 2016-06-19 VITALS — Resp 14 | Ht 65.0 in | Wt 179.0 lb

## 2016-06-19 DIAGNOSIS — Q828 Other specified congenital malformations of skin: Secondary | ICD-10-CM

## 2016-06-19 DIAGNOSIS — E1140 Type 2 diabetes mellitus with diabetic neuropathy, unspecified: Secondary | ICD-10-CM | POA: Diagnosis not present

## 2016-06-19 DIAGNOSIS — B351 Tinea unguium: Secondary | ICD-10-CM

## 2016-06-19 DIAGNOSIS — M79676 Pain in unspecified toe(s): Secondary | ICD-10-CM | POA: Diagnosis not present

## 2016-06-19 DIAGNOSIS — L84 Corns and callosities: Secondary | ICD-10-CM

## 2016-06-19 NOTE — Progress Notes (Signed)
Patient ID: Eric Beem., male   DOB: 1928/08/10, 82 y.o.   MRN: [id number] Complaint:  Visit Type: Patient returns to my office for continued preventative foot care services. Complaint: Patient states" my nails have grown long and thick and become painful to walk and wear shoes" Patient has been diagnosed with DM with neuropathy.. The patient presents for preventative foot care services. No changes to ROS.  He has painful callus under the outside ball of both feet.  He has picked up his diabetic shoes and has been wearing the shoes without problems.    Podiatric Exam: Vascular: dorsalis pedis and posterior tibial pulses are mildly palpable   bilateral. Capillary return is immediate. Temperature gradient is WNL. Skin turgor WNL  Sensorium: Absent  Semmes Weinstein monofilament test. Normal tactile sensation bilaterally. Nail Exam: Pt has thick disfigured discolored nails with subungual debris noted bilateral entire nail hallux through fifth toenails  Orthopedic Exam: Muscle tone and strength are WNL. No limitations in general ROM. No crepitus or effusions noted. Foot type and digits show no abnormalities. Bony prominences are unremarkable. Skin: Porokeratosis sub 5th metatarsal left foot.  There is porokeratotic lesion sub 5th met B/L.[name] Distal clavi 3rd toe left foot .  Diagnosis:  Onychomycosis, , Pain in right toe, pain in left toes,  Porokeratosis sub 5th met B/L. Distal clavi 3rd toe left foot.  Treatment & Plan Procedures and Treatment: Consent by patient was obtained for treatment procedures. The patient understood the discussion of treatment and procedures well. All questions were answered thoroughly reviewed. Debridement of mycotic and hypertrophic toenails, 1 through 5 bilateral and clearing of subungual debris. No ulceration, no infection noted.  Debridement of porokeratosis and clavi.  Return Visit-Office Procedure: Patient instructed to return to the office for a follow up visit 10  weeks. for continued evaluation and treatment.    Gardiner Barefoot DPM

## 2016-07-17 ENCOUNTER — Other Ambulatory Visit: Payer: Self-pay | Admitting: Family Medicine

## 2016-07-17 MED ORDER — ONETOUCH DELICA LANCETS FINE MISC: 1.0000 | Freq: Every day | 3 refills | Status: DC

## 2016-07-20 ENCOUNTER — Encounter: Payer: Self-pay | Admitting: Cardiology

## 2016-08-04 ENCOUNTER — Encounter: Payer: Self-pay | Admitting: Family Medicine

## 2016-08-05 ENCOUNTER — Encounter: Payer: Self-pay | Admitting: Family Medicine

## 2016-08-07 ENCOUNTER — Other Ambulatory Visit: Payer: Self-pay | Admitting: Pharmacist

## 2016-08-07 ENCOUNTER — Other Ambulatory Visit: Payer: Self-pay | Admitting: Cardiology

## 2016-08-07 ENCOUNTER — Encounter: Payer: Self-pay | Admitting: Family Medicine

## 2016-08-07 MED ORDER — APIXABAN 2.5 MG PO TABS: 2.5000 mg | ORAL_TABLET | Freq: Two times a day (BID) | ORAL | 0 refills | Status: DC

## 2016-08-11 ENCOUNTER — Encounter: Payer: Self-pay | Admitting: *Deleted

## 2016-08-11 NOTE — Telephone Encounter (Signed)
This encounter was created in error - please disregard.

## 2016-08-12 ENCOUNTER — Other Ambulatory Visit: Payer: Self-pay | Admitting: *Deleted

## 2016-08-12 MED ORDER — APIXABAN 2.5 MG PO TABS: 2.5000 mg | ORAL_TABLET | Freq: Two times a day (BID) | ORAL | 0 refills | Status: DC

## 2016-08-13 ENCOUNTER — Other Ambulatory Visit: Payer: PPO

## 2016-08-13 DIAGNOSIS — E1151 Type 2 diabetes mellitus with diabetic peripheral angiopathy without gangrene: Secondary | ICD-10-CM | POA: Diagnosis not present

## 2016-08-13 DIAGNOSIS — N189 Chronic kidney disease, unspecified: Secondary | ICD-10-CM

## 2016-08-13 DIAGNOSIS — E119 Type 2 diabetes mellitus without complications: Secondary | ICD-10-CM

## 2016-08-13 DIAGNOSIS — Z79899 Other long term (current) drug therapy: Secondary | ICD-10-CM

## 2016-08-13 DIAGNOSIS — E785 Hyperlipidemia, unspecified: Secondary | ICD-10-CM | POA: Diagnosis not present

## 2016-08-13 DIAGNOSIS — E1142 Type 2 diabetes mellitus with diabetic polyneuropathy: Secondary | ICD-10-CM | POA: Diagnosis not present

## 2016-08-13 DIAGNOSIS — I10 Essential (primary) hypertension: Secondary | ICD-10-CM

## 2016-08-13 LAB — CBC WITH DIFFERENTIAL/PLATELET
BASOS PCT: 1 %
Basophils Absolute: 82 cells/uL (ref 0–200)
EOS ABS: 246 {cells}/uL (ref 15–500)
Eosinophils Relative: 3 %
HEMATOCRIT: 41.9 % (ref 38.5–50.0)
Hemoglobin: 13.4 g/dL (ref 13.0–17.0)
LYMPHS ABS: 3116 {cells}/uL (ref 850–3900)
Lymphocytes Relative: 38 %
MCH: 30.8 pg (ref 27.0–33.0)
MCHC: 32 g/dL (ref 32.0–36.0)
MCV: 96.3 fL (ref 80.0–100.0)
MONO ABS: 574 {cells}/uL (ref 200–950)
MPV: 10.4 fL (ref 7.5–12.5)
Monocytes Relative: 7 %
NEUTROS ABS: 4182 {cells}/uL (ref 1500–7800)
Neutrophils Relative %: 51 %
Platelets: 185 10*3/uL (ref 140–400)
RBC: 4.35 MIL/uL (ref 4.20–5.80)
RDW: 14.7 % (ref 11.0–15.0)
WBC: 8.2 10*3/uL (ref 3.8–10.8)

## 2016-08-13 LAB — COMPREHENSIVE METABOLIC PANEL
ALK PHOS: 28 U/L — AB (ref 40–115)
ALT: 20 U/L (ref 9–46)
AST: 38 U/L — AB (ref 10–35)
Albumin: 4.2 g/dL (ref 3.6–5.1)
BILIRUBIN TOTAL: 0.7 mg/dL (ref 0.2–1.2)
BUN: 39 mg/dL — AB (ref 7–25)
CALCIUM: 10 mg/dL (ref 8.6–10.3)
CO2: 23 mmol/L (ref 20–31)
Chloride: 102 mmol/L (ref 98–110)
Creat: 1.65 mg/dL — ABNORMAL HIGH (ref 0.70–1.11)
GLUCOSE: 123 mg/dL — AB (ref 70–99)
POTASSIUM: 5.3 mmol/L (ref 3.5–5.3)
Sodium: 138 mmol/L (ref 135–146)
TOTAL PROTEIN: 7.4 g/dL (ref 6.1–8.1)

## 2016-08-13 LAB — HEMOGLOBIN A1C
HEMOGLOBIN A1C: 6.2 % — AB (ref <5.7)
Mean Plasma Glucose: 131 mg/dL

## 2016-08-13 LAB — LIPID PANEL
CHOLESTEROL: 131 mg/dL (ref <200)
HDL: 24 mg/dL — ABNORMAL LOW (ref >40)
LDL CALC: 77 mg/dL (ref <100)
TRIGLYCERIDES: 148 mg/dL (ref <150)
Total CHOL/HDL Ratio: 5.5 Ratio — ABNORMAL HIGH (ref <5.0)
VLDL: 30 mg/dL (ref <30)

## 2016-08-14 ENCOUNTER — Ambulatory Visit (INDEPENDENT_AMBULATORY_CARE_PROVIDER_SITE_OTHER): Payer: PPO | Admitting: Family Medicine

## 2016-08-14 ENCOUNTER — Encounter: Payer: Self-pay | Admitting: Family Medicine

## 2016-08-14 VITALS — BP 120/60 | HR 74 | Temp 98.5°F | Resp 18 | Wt 184.0 lb

## 2016-08-14 DIAGNOSIS — L84 Corns and callosities: Secondary | ICD-10-CM

## 2016-08-14 DIAGNOSIS — E119 Type 2 diabetes mellitus without complications: Secondary | ICD-10-CM

## 2016-08-14 DIAGNOSIS — I739 Peripheral vascular disease, unspecified: Secondary | ICD-10-CM | POA: Diagnosis not present

## 2016-08-14 DIAGNOSIS — I2510 Atherosclerotic heart disease of native coronary artery without angina pectoris: Secondary | ICD-10-CM | POA: Diagnosis not present

## 2016-08-14 DIAGNOSIS — E7800 Pure hypercholesterolemia, unspecified: Secondary | ICD-10-CM

## 2016-08-14 DIAGNOSIS — N183 Chronic kidney disease, stage 3 unspecified: Secondary | ICD-10-CM

## 2016-08-14 LAB — MICROALBUMIN / CREATININE URINE RATIO
CREATININE, URINE: 62 mg/dL (ref 20–370)
MICROALB UR: 4.8 mg/dL
Microalb Creat Ratio: 77 mcg/mg creat — ABNORMAL HIGH (ref <30)

## 2016-08-14 NOTE — Progress Notes (Signed)
Subjective:    Patient ID: Eric [name]., male    DOB: 1928/07/10, 83 y.o.   MRN: [id number]  Medication Refill    Patient is doing very well. He denies any chest pain shortness of breath or dyspnea on exertion. He denies any myalgias or right upper quadrant pain. He denies any nausea vomiting diarrhea. He denies any polyuria polydipsia or blurry vision. He does have daily neuropathy in his feet with numbness and tingling in his toes. Otherwise he's been doing well with no problems. Past Medical History:  Diagnosis Date  . Atrial fibrillation ([place])   . Atrial flutter ([place])    s/p RFCA  . CAD (coronary artery disease)    cath 2003, occluded S-RCA, occluded S-Dx, L-LAD ok, s/p PTCA to LAD  . Cataract   . Diabetes mellitus   . Long term (current) use of anticoagulants   . Neuromuscular disorder ([place])   . OSA (obstructive sleep apnea) 12/11   very mild, AHI 7/hr  . Persistent atrial fibrillation ([place]) 09/26/2015  . Pure hyperglyceridemia   . PVD (peripheral vascular disease) ([place])    angioplasty of his right lower extremity in [place] by Dr.[name] 2013  . Unspecified essential hypertension    Past Surgical History:  Procedure Laterality Date  . Adenosine Myoview  3/06   EF 56%, neg. Ischemia  . Adenosine Myoview  02/18/07   nml  . ANGIOPLASTY  1/99   CAD- diogonal with rotational artherectomy  . Arthrectomy     of LAD & PTCA  . CARDIAC CATHETERIZATION  1/00  . CARDIOVERSION  1/04  . CARDIOVERSION  5/07   hospital- a flutter  . CARPAL TUNNEL RELEASE     ? bilateral  . COLONOSCOPY W/ BIOPSIES  10/01/06   sigmoid polyp bx neg, 3 years  . CORONARY ANGIOPLASTY  4/03   cutting balloon PTCA pLAD into Diag  . CORONARY ARTERY BYPASS GRAFT  2000   LIMA-LAD, SVG-RCA, SVG-Diag; SVG-Diag & SVG-RCA occluded 2003  . FRACTURE SURGERY    . HAND SURGERY  08/27/09   R thumb procedure wit Scaphoid Gragt and screws, Dr [name]  . NM MYOVIEW LTD  4/11   normal  . PERIPHERAL VASCULAR  CATHETERIZATION Right 06/27/2015   Procedure: Lower Extremity Angiography;  Surgeon: Algernon [name], MD;  Location: [facility];  Service: Cardiovascular;  Laterality: Right;  . PERIPHERAL VASCULAR CATHETERIZATION  06/27/2015   Procedure: Lower Extremity Intervention;  Surgeon: Algernon [name], MD;  Location: [facility];  Service: Cardiovascular;;   Current Outpatient Prescriptions on File Prior to Visit  Medication Sig Dispense Refill  . acetaminophen (TYLENOL) 500 MG tablet Take 1,000 mg by mouth every 8 (eight) hours as needed for mild pain.     [name] apixaban (ELIQUIS) 2.5 MG TABS tablet Take 1 tablet (2.5 mg total) by mouth 2 (two) times daily. 60 tablet 0  . B Complex Vitamins (B COMPLEX-B12) TABS Take 1 tablet by mouth daily.     . Blood Glucose Monitoring Suppl (ONE TOUCH ULTRA SYSTEM KIT) W/DEVICE KIT 1 kit by Does not apply route once. 1 each 0  . cholecalciferol (VITAMIN D) 1000 UNITS tablet Take 1,000 Units by mouth 2 (two) times a week. Takes on Mon and Fri    . CINNAMON PO Take by mouth 2 (two) times daily.     . fenofibrate 160 MG tablet Take 1 tablet (160 mg total) by mouth daily. 90 tablet 3  . fish  oil-omega-3 fatty acids 1000 MG capsule Take 3 g by mouth daily.     . furosemide (LASIX) 20 MG tablet Take 1 tablet (20 mg total) by mouth daily. 90 tablet 3  . glucose blood test strip Use as instructed 100 each 3  . lisinopril (PRINIVIL,ZESTRIL) 40 MG tablet Take 0.5 tablets (20 mg total) by mouth daily. 90 tablet 3  . metoprolol (LOPRESSOR) 50 MG tablet Take 1 tablet (50 mg total) by mouth 2 (two) times daily. 180 tablet 3  . Multiple Vitamin (DAILY MULTIVITAMIN PO) Take 1 tablet by mouth daily.      [name] DELICA LANCETS FINE MISC 1 each by Does not apply route daily. 100 each 3  . rosuvastatin (CRESTOR) 40 MG tablet Take 1 tablet (40 mg total) by mouth daily. 90 tablet 3   No current facility-administered medications on file prior to visit.    Allergies    Allergen Reactions  . Isosorbide Mononitrate Other (See Comments)    Unknown   . Quinidine Gluconate Other (See Comments)    Unknown  . Ramipril Other (See Comments)    Unknown   Social History   Social History  . Marital status: Married    Spouse name: N/A  . Number of children: 5  . Years of education: N/A   Occupational History  . [employer] Retired   Social History Main Topics  . Smoking status: Never Smoker  . Smokeless tobacco: Former Systems developer  . Alcohol use No  . Drug use: No  . Sexual activity: No   Other Topics Concern  . 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      Objective:   Physical Exam  Cardiovascular: Normal rate, regular rhythm and normal heart sounds.   Pulmonary/Chest: Breath sounds normal. No respiratory distress. He has no wheezes. He has no rales.  Abdominal: Soft. He exhibits no distension and no mass. There is no tenderness. There is no rebound and no guarding. Hernia confirmed negative in the right inguinal area and confirmed negative in the left inguinal area.  Genitourinary: Testes normal.    Right testis shows no mass and no tenderness. Left testis shows no mass.  Lymphadenopathy:       Right: No inguinal adenopathy present.       Left: No inguinal adenopathy present.  Vitals reviewed.         Assessment & Plan:  Controlled type 2 diabetes mellitus without complication, without long-term current use of insulin (H[facility])  Pure hypercholesterolemia  Coronary artery disease involving native coronary artery of native heart without angina pectoris  Chronic kidney disease, stage III (moderate)  PVD (peripheral vascular disease) ([place])  Lab on 08/13/2016  Component Date Value Ref Range Status  . Creatinine, Urine 08/14/2016 62  20 - 370 mg/dL Final  . Microalb, Ur  08/14/2016 4.8  Not estab mg/dL Final  . Microalb Creat Ratio 08/14/2016 77* <30 mcg/mg creat Final   Comment: The ADA has defined abnormalities in albumin excretion as follows:           Category           Result                            (  mcg/mg creatinine)                 Normal:    <30       Microalbuminuria:    30 - 299   Clinical albuminuria:    > or = 300   The ADA recommends that at least two of three specimens collected within a 3 - 6 month period be abnormal before considering a patient to be within a diagnostic category.     . WBC 08/13/2016 8.2  3.8 - 10.8 K/uL Final  . RBC 08/13/2016 4.35  4.20 - 5.80 MIL/uL Final  . Hemoglobin 08/13/2016 13.4  13.0 - 17.0 g/dL Final  . HCT 08/13/2016 41.9  38.5 - 50.0 % Final  . MCV 08/13/2016 96.3  80.0 - 100.0 fL Final  . MCH 08/13/2016 30.8  27.0 - 33.0 pg Final  . MCHC 08/13/2016 32.0  32.0 - 36.0 g/dL Final  . RDW 08/13/2016 14.7  11.0 - 15.0 % Final  . Platelets 08/13/2016 185  140 - 400 K/uL Final  . MPV 08/13/2016 10.4  7.5 - 12.5 fL Final  . Neutro Abs 08/13/2016 4182  1,500 - 7,800 cells/uL Final  . Lymphs Abs 08/13/2016 3116  850 - 3,900 cells/uL Final  . Monocytes Absolute 08/13/2016 574  200 - 950 cells/uL Final  . Eosinophils Absolute 08/13/2016 246  15 - 500 cells/uL Final  . Basophils Absolute 08/13/2016 82  0 - 200 cells/uL Final  . Neutrophils Relative % 08/13/2016 51  % Final  . Lymphocytes Relative 08/13/2016 38  % Final  . Monocytes Relative 08/13/2016 7  % Final  . Eosinophils Relative 08/13/2016 3  % Final  . Basophils Relative 08/13/2016 1  % Final  . Smear Review 08/13/2016 Criteria for review not met   Final  . Sodium 08/13/2016 138  135 - 146 mmol/L Final  . Potassium 08/13/2016 5.3  3.5 - 5.3 mmol/L Final  . Chloride 08/13/2016 102  98 - 110 mmol/L Final  . CO2 08/13/2016 23  20 - 31 mmol/L Final  . Glucose, Bld 08/13/2016 123* 70 - 99 mg/dL Final  . BUN 08/13/2016 39* 7 - 25 mg/dL Final  . Creat  08/13/2016 1.65* 0.70 - 1.11 mg/dL Final   Comment:   For patients > or = 81 years of age: The upper reference limit for Creatinine is approximately 13% higher for people identified as African-American.     . Total Bilirubin 08/13/2016 0.7  0.2 - 1.2 mg/dL Final  . Alkaline Phosphatase 08/13/2016 28* 40 - 115 U/L Final  . AST 08/13/2016 38* 10 - 35 U/L Final  . ALT 08/13/2016 20  9 - 46 U/L Final  . Total Protein 08/13/2016 7.4  6.1 - 8.1 g/dL Final  . Albumin 08/13/2016 4.2  3.6 - 5.1 g/dL Final  . Calcium 08/13/2016 10.0  8.6 - 10.3 mg/dL Final  . Hgb A1c MFr Bld 08/13/2016 6.2* <5.7 % Final   Comment:   For someone without known diabetes, a hemoglobin A1c value between 5.7% and 6.4% is consistent with prediabetes and should be confirmed with a follow-up test.   For someone with known diabetes, a value <7% indicates that their diabetes is well controlled. A1c targets should be individualized based on duration of diabetes, age, co-morbid conditions and other considerations.   This assay result is consistent with an increased risk of diabetes.   Currently, no consensus exists regarding use of hemoglobin A1c for diagnosis of diabetes in  children.     . Mean Plasma Glucose 08/13/2016 131  mg/dL Final  . Cholesterol 08/13/2016 131  <200 mg/dL Final  . Triglycerides 08/13/2016 148  <150 mg/dL Final  . HDL 08/13/2016 24* >40 mg/dL Final  . Total CHOL/HDL Ratio 08/13/2016 5.5* <5.0 Ratio Final  . VLDL 08/13/2016 30  <30 mg/dL Final  . LDL Cholesterol 08/13/2016 77  <100 mg/dL Final   CKD is stable. Hemoglobin A1c is excellent at 6.2. LDL cholesterol is very close to his goal of 70 at 77. Liver function test and CBC are within normal limits. Blood pressures excellent.  Patient has a pre-ulcerative callus on the tip of his left third toe. I spent 20 minutes debriding the thick hyperkeratotic skin using a razor blade down to the healthy viable underlying tissue. There is no skin  breakdown. We need to continue to do this every 2 weeks until the pre-ulcerative callus resolves. Recheck the patient in 2 weeks

## 2016-08-28 ENCOUNTER — Ambulatory Visit (INDEPENDENT_AMBULATORY_CARE_PROVIDER_SITE_OTHER): Payer: PPO | Admitting: Family Medicine

## 2016-08-28 ENCOUNTER — Encounter: Payer: Self-pay | Admitting: Family Medicine

## 2016-08-28 VITALS — BP 132/76 | HR 56 | Temp 98.2°F | Resp 18 | Wt 185.0 lb

## 2016-08-28 DIAGNOSIS — L97501 Non-pressure chronic ulcer of other part of unspecified foot limited to breakdown of skin: Secondary | ICD-10-CM

## 2016-08-28 DIAGNOSIS — E08621 Diabetes mellitus due to underlying condition with foot ulcer: Secondary | ICD-10-CM | POA: Diagnosis not present

## 2016-08-28 NOTE — Progress Notes (Signed)
Subjective:    Patient ID: Eric [name]., male    DOB: [birth year], 83 y.o.   MRN: [id number]  Medication Refill   Please see the last office visit. At the patient's last visit for.I found an ulcerative callus on the tip of his third left toe. It is approximately 1 cm in diameter. I debrided down to healthy tissue removing the hyperkeratotic tissue. He is here today for follow-up. On the tip of the left third toe is a patch approximately 1.3 cm in diameter of thick hyperkeratotic tissue. However the central ulcer appears almost completely healed. It is now 2 mm in size. Past Medical History:  Diagnosis Date  . Atrial fibrillation ([place])   . Atrial flutter ([place])    s/p RFCA  . CAD (coronary artery disease)    cath 2003, occluded S-RCA, occluded S-Dx, L-LAD ok, s/p PTCA to LAD  . Cataract   . Diabetes mellitus   . Long term (current) use of anticoagulants   . Neuromuscular disorder ([place])   . OSA (obstructive sleep apnea) 12/11   very mild, AHI 7/hr  . Persistent atrial fibrillation ([place]) 09/26/2015  . Pure hyperglyceridemia   . PVD (peripheral vascular disease) ([place])    angioplasty of his right lower extremity in [place] by Dr.[name] 2013  . Unspecified essential hypertension    Past Surgical History:  Procedure Laterality Date  . Adenosine Myoview  3/06   EF 56%, neg. Ischemia  . Adenosine Myoview  02/18/07   nml  . ANGIOPLASTY  1/99   CAD- diogonal with rotational artherectomy  . Arthrectomy     of LAD & PTCA  . CARDIAC CATHETERIZATION  1/00  . CARDIOVERSION  1/04  . CARDIOVERSION  5/07   hospital- a flutter  . CARPAL TUNNEL RELEASE     ? bilateral  . COLONOSCOPY W/ BIOPSIES  10/01/06   sigmoid polyp bx neg, 3 years  . CORONARY ANGIOPLASTY  4/03   cutting balloon PTCA pLAD into Diag  . CORONARY ARTERY BYPASS GRAFT  2000   LIMA-LAD, SVG-RCA, SVG-Diag; SVG-Diag & SVG-RCA occluded 2003  . FRACTURE SURGERY    . HAND SURGERY  08/27/09   R thumb procedure wit Scaphoid  Gragt and screws, Dr [name]  . NM MYOVIEW LTD  4/11   normal  . PERIPHERAL VASCULAR CATHETERIZATION Right 06/27/2015   Procedure: Lower Extremity Angiography;  Surgeon: Algernon [name], MD;  Location: [facility];  Service: Cardiovascular;  Laterality: Right;  . PERIPHERAL VASCULAR CATHETERIZATION  06/27/2015   Procedure: Lower Extremity Intervention;  Surgeon: Algernon [name], MD;  Location: [facility];  Service: Cardiovascular;;   Current Outpatient Prescriptions on File Prior to Visit  Medication Sig Dispense Refill  . acetaminophen (TYLENOL) 500 MG tablet Take 1,000 mg by mouth every 8 (eight) hours as needed for mild pain.     [name] apixaban (ELIQUIS) 2.5 MG TABS tablet Take 1 tablet (2.5 mg total) by mouth 2 (two) times daily. 60 tablet 0  . B Complex Vitamins (B COMPLEX-B12) TABS Take 1 tablet by mouth daily.     . Blood Glucose Monitoring Suppl (ONE TOUCH ULTRA SYSTEM KIT) W/DEVICE KIT 1 kit by Does not apply route once. 1 each 0  . cholecalciferol (VITAMIN D) 1000 UNITS tablet Take 1,000 Units by mouth 2 (two) times a week. Takes on Mon and Fri    . CINNAMON PO Take by mouth 2 (two) times daily.     . fenofibrate  160 MG tablet Take 1 tablet (160 mg total) by mouth daily. 90 tablet 3  . fish oil-omega-3 fatty acids 1000 MG capsule Take 3 g by mouth daily.     . furosemide (LASIX) 20 MG tablet Take 1 tablet (20 mg total) by mouth daily. 90 tablet 3  . glucose blood test strip Use as instructed 100 each 3  . lisinopril (PRINIVIL,ZESTRIL) 40 MG tablet Take 0.5 tablets (20 mg total) by mouth daily. 90 tablet 3  . metoprolol (LOPRESSOR) 50 MG tablet Take 1 tablet (50 mg total) by mouth 2 (two) times daily. 180 tablet 3  . Multiple Vitamin (DAILY MULTIVITAMIN PO) Take 1 tablet by mouth daily.      [name] DELICA LANCETS FINE MISC 1 each by Does not apply route daily. 100 each 3  . rosuvastatin (CRESTOR) 40 MG tablet Take 1 tablet (40 mg total) by mouth daily. 90 tablet 3   No  current facility-administered medications on file prior to visit.    Allergies  Allergen Reactions  . Isosorbide Mononitrate Other (See Comments)    Unknown   . Quinidine Gluconate Other (See Comments)    Unknown  . Ramipril Other (See Comments)    Unknown   Social History   Social History  . Marital status: Married    Spouse name: N/A  . Number of children: 5  . Years of education: N/A   Occupational History  . [employer] Retired   Social History Main Topics  . Smoking status: Never Smoker  . Smokeless tobacco: Former Systems developer  . Alcohol use No  . Drug use: No  . Sexual activity: No   Other Topics Concern  . 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      Objective:   Physical Exam  Cardiovascular: Normal rate, regular rhythm and normal heart sounds.   Pulmonary/Chest: Breath sounds normal. No respiratory distress. He has no wheezes. He has no rales.  Vitals reviewed.         Assessment & Plan:  Diabetic ulcer on the tip of the left third toe. I debrided the hyperkeratotic tissue from around the ulcer. Ulcers possibly 2 mm in size and there is no evidence of cellulitis. Is extremely superficial. Recheck in 2 weeks and repeat process until healed

## 2016-09-08 NOTE — Progress Notes (Signed)
HPI: FU atrial fibrillation, coronary artery disease. Patient is status post coronary artery bypassing graft in 2000 with a LIMA to the LAD, saphenous vein graft to the diagonal and saphenous vein graft to the PDA. Cardiac catheterization in 2003 revealed normal left main, occluded LAD after the first diagonal, 80% LAD leading into the diagonal, 50% ramus, 50% obtuse marginal 70% mid PDA and an 80% right coronary artery. The saphenous vein graft to the right coronary and diagonal were occluded. The LIMA to the LAD was patent. LV function was normal. Patient had PCI of the LAD at that time. Patient also with peripheral vascular disease and has had prior angioplasty of his right lower extremity in [place] 2013. He also has permanent atrial fibrillation. Last nuclear study in March of 2015 was not gated. There was a small amount of ischemia in the mid anterior wall. Since he was last seen patient has dyspnea with more extreme activities. Not routine activities. No orthopnea, PND, pedal edema, palpitations, syncope, chest pain or bleeding.  Current Outpatient Prescriptions  Medication Sig Dispense Refill  . acetaminophen (TYLENOL) 500 MG tablet Take 1,000 mg by mouth every 8 (eight) hours as needed for mild pain.     [name] apixaban (ELIQUIS) 2.5 MG TABS tablet Take 1 tablet (2.5 mg total) by mouth 2 (two) times daily. 60 tablet 0  . B Complex Vitamins (B COMPLEX-B12) TABS Take 1 tablet by mouth daily.     . Blood Glucose Monitoring Suppl (ONE TOUCH ULTRA SYSTEM KIT) W/DEVICE KIT 1 kit by Does not apply route once. 1 each 0  . cholecalciferol (VITAMIN D) 1000 UNITS tablet Take 1,000 Units by mouth 2 (two) times a week. Takes on Mon and Fri    . CINNAMON PO Take by mouth 2 (two) times daily.     . fenofibrate 160 MG tablet Take 1 tablet (160 mg total) by mouth daily. 90 tablet 3  . fish oil-omega-3 fatty acids 1000 MG capsule Take 3 g by mouth daily.     . furosemide (LASIX) 20 MG tablet Take 1 tablet  (20 mg total) by mouth daily. 90 tablet 3  . glucose blood test strip Use as instructed 100 each 3  . lisinopril (PRINIVIL,ZESTRIL) 40 MG tablet Take 0.5 tablets (20 mg total) by mouth daily. 90 tablet 3  . metoprolol (LOPRESSOR) 50 MG tablet Take 1 tablet (50 mg total) by mouth 2 (two) times daily. 180 tablet 3  . Multiple Vitamin (DAILY MULTIVITAMIN PO) Take 1 tablet by mouth daily.      [name] DELICA LANCETS FINE MISC 1 each by Does not apply route daily. 100 each 3  . rosuvastatin (CRESTOR) 40 MG tablet Take 1 tablet (40 mg total) by mouth daily. 90 tablet 3   No current facility-administered medications for this visit.      Past Medical History:  Diagnosis Date  . Atrial fibrillation ([place])   . Atrial flutter ([place])    s/p RFCA  . CAD (coronary artery disease)    cath 2003, occluded S-RCA, occluded S-Dx, L-LAD ok, s/p PTCA to LAD  . Cataract   . Diabetes mellitus   . Long term (current) use of anticoagulants   . Neuromuscular disorder ([place])   . OSA (obstructive sleep apnea) 12/11   very mild, AHI 7/hr  . Persistent atrial fibrillation ([place]) 09/26/2015  . Pure hyperglyceridemia   . PVD (peripheral vascular disease) ([place])    angioplasty of his right lower extremity  in [place] by Dr.[name] 2013  . Unspecified essential hypertension     Past Surgical History:  Procedure Laterality Date  . Adenosine Myoview  3/06   EF 56%, neg. Ischemia  . Adenosine Myoview  02/18/07   nml  . ANGIOPLASTY  1/99   CAD- diogonal with rotational artherectomy  . Arthrectomy     of LAD & PTCA  . CARDIAC CATHETERIZATION  1/00  . CARDIOVERSION  1/04  . CARDIOVERSION  5/07   hospital- a flutter  . CARPAL TUNNEL RELEASE     ? bilateral  . COLONOSCOPY W/ BIOPSIES  10/01/06   sigmoid polyp bx neg, 3 years  . CORONARY ANGIOPLASTY  4/03   cutting balloon PTCA pLAD into Diag  . CORONARY ARTERY BYPASS GRAFT  2000   LIMA-LAD, SVG-RCA, SVG-Diag; SVG-Diag & SVG-RCA occluded 2003  . FRACTURE SURGERY     . HAND SURGERY  08/27/09   R thumb procedure wit Scaphoid Gragt and screws, Dr [name]  . NM MYOVIEW LTD  4/11   normal  . PERIPHERAL VASCULAR CATHETERIZATION Right 06/27/2015   Procedure: Lower Extremity Angiography;  Surgeon: Algernon [name], MD;  Location: [facility];  Service: Cardiovascular;  Laterality: Right;  . PERIPHERAL VASCULAR CATHETERIZATION  06/27/2015   Procedure: Lower Extremity Intervention;  Surgeon: Algernon [name], MD;  Location: [facility];  Service: Cardiovascular;;    Social History   Social History  . Marital status: Married    Spouse name: N/A  . Number of children: 5  . Years of education: N/A   Occupational History  . [employer] Retired   Social History Main Topics  . Smoking status: Never Smoker  . Smokeless tobacco: Former Systems developer  . Alcohol use No  . Drug use: No  . Sexual activity: No   Other Topics Concern  . Not on file   Social History Narrative   Retired now does some antiques   Retired from Theatre manager and dye work   Married 1951   5 kids    Family History  Problem Relation Age of Onset  . Stroke Father   . Aneurysm Father   . Hypertension Father   . Prostate cancer Brother   . Hypertension Mother   . Lung cancer Brother     smoker  . Thrombosis Brother   . Other Brother     RF valve disorder (smoker)  . Lung cancer Brother     smoker  . Breast cancer Sister   . Liver cancer Brother   . Other Sister     cerebral hemorrhage    ROS: no fevers or chills, productive cough, hemoptysis, dysphasia, odynophagia, melena, hematochezia, dysuria, hematuria, rash, seizure activity, orthopnea, PND, pedal edema, claudication. Remaining systems are negative.  Physical Exam: Well-developed well-nourished in no acute distress.  Skin is warm and dry.  HEENT is normal.  Neck is supple. No bruits Chest is clear to auscultation with normal expansion.  Cardiovascular exam is irregular Abdominal exam nontender or distended. No  masses palpated. Extremities show no edema. neuro grossly intact  ECG-Atrial fibrillation at a rate of 60. Nonspecific ST changes.   A/P  1 Coronary artery disease-continue statin. No aspirin given need for anticoagulation.  2 permanent atrial fibrillation-continue metoprolol for rate control. Continue apixaban. Recent creatinine 1.65. Continue 2.5 mg twice a day.  3 Hypertension-blood pressure mildly elevated and elevated at home per pt; add amlodipine 2.5 mg q hs and follow.  4 peripheral  vascular disease-continue statin.  5 hyperlipidemia-continue present medications.   Kirk Ruths, MD

## 2016-09-11 ENCOUNTER — Ambulatory Visit (INDEPENDENT_AMBULATORY_CARE_PROVIDER_SITE_OTHER): Payer: PPO | Admitting: Family Medicine

## 2016-09-11 ENCOUNTER — Encounter: Payer: Self-pay | Admitting: Family Medicine

## 2016-09-11 VITALS — BP 140/70 | HR 58 | Temp 98.4°F | Resp 18 | Ht 65.0 in | Wt 184.0 lb

## 2016-09-11 DIAGNOSIS — L97501 Non-pressure chronic ulcer of other part of unspecified foot limited to breakdown of skin: Secondary | ICD-10-CM | POA: Diagnosis not present

## 2016-09-11 DIAGNOSIS — E08621 Diabetes mellitus due to underlying condition with foot ulcer: Secondary | ICD-10-CM | POA: Diagnosis not present

## 2016-09-11 NOTE — Progress Notes (Signed)
Subjective:    Patient ID: Eric [name]., male    DOB: [birth year], 83 y.o.   MRN: [id number]  Medication Refill   Please see the last office visit. At the patient's last visit for.I found an ulcerative callus on the tip of his third left toe. It is approximately 1 cm in diameter. I debrided down to healthy tissue removing the hyperkeratotic tissue. He is here today for follow-up. On the tip of the left third toe is a patch approximately 1.3 cm in diameter of thick hyperkeratotic tissue. However the central ulcer appears almost completely healed. It is now 2 mm in size.  AT that time, my plan was: I debrided the hyperkeratotic tissue from around the ulcer. Ulcers possibly 2 mm in size and there is no evidence of cellulitis. Is extremely superficial. Recheck in 2 weeks and repeat process until healed  09/11/16 Here today for recheck. The ulcer has completely healed and with healthy tissue. There is still a halo of thick hyperkeratotic pre-ulcerative callus which I removed with a razor blade down to healthy tissue. There is no evidence of cellulitis. Thankfully there is no longer any skin breakdown Past Medical History:  Diagnosis Date  . Atrial fibrillation ([place])   . Atrial flutter ([place])    s/p RFCA  . CAD (coronary artery disease)    cath 2003, occluded S-RCA, occluded S-Dx, L-LAD ok, s/p PTCA to LAD  . Cataract   . Diabetes mellitus   . Long term (current) use of anticoagulants   . Neuromuscular disorder ([place])   . OSA (obstructive sleep apnea) 12/11   very mild, AHI 7/hr  . Persistent atrial fibrillation ([place]) 09/26/2015  . Pure hyperglyceridemia   . PVD (peripheral vascular disease) ([place])    angioplasty of his right lower extremity in [place] by Dr.[name] 2013  . Unspecified essential hypertension    Past Surgical History:  Procedure Laterality Date  . Adenosine Myoview  3/06   EF 56%, neg. Ischemia  . Adenosine Myoview  02/18/07   nml  . ANGIOPLASTY  1/99   CAD- diogonal  with rotational artherectomy  . Arthrectomy     of LAD & PTCA  . CARDIAC CATHETERIZATION  1/00  . CARDIOVERSION  1/04  . CARDIOVERSION  5/07   hospital- a flutter  . CARPAL TUNNEL RELEASE     ? bilateral  . COLONOSCOPY W/ BIOPSIES  10/01/06   sigmoid polyp bx neg, 3 years  . CORONARY ANGIOPLASTY  4/03   cutting balloon PTCA pLAD into Diag  . CORONARY ARTERY BYPASS GRAFT  2000   LIMA-LAD, SVG-RCA, SVG-Diag; SVG-Diag & SVG-RCA occluded 2003  . FRACTURE SURGERY    . HAND SURGERY  08/27/09   R thumb procedure wit Scaphoid Gragt and screws, Dr [name]  . NM MYOVIEW LTD  4/11   normal  . PERIPHERAL VASCULAR CATHETERIZATION Right 06/27/2015   Procedure: Lower Extremity Angiography;  Surgeon: Algernon [name], MD;  Location: [facility];  Service: Cardiovascular;  Laterality: Right;  . PERIPHERAL VASCULAR CATHETERIZATION  06/27/2015   Procedure: Lower Extremity Intervention;  Surgeon: Algernon [name], MD;  Location: [facility];  Service: Cardiovascular;;   Current Outpatient Prescriptions on File Prior to Visit  Medication Sig Dispense Refill  . acetaminophen (TYLENOL) 500 MG tablet Take 1,000 mg by mouth every 8 (eight) hours as needed for mild pain.     [name] apixaban (ELIQUIS) 2.5 MG TABS tablet Take 1 tablet (2.5 mg total) by  mouth 2 (two) times daily. 60 tablet 0  . B Complex Vitamins (B COMPLEX-B12) TABS Take 1 tablet by mouth daily.     . Blood Glucose Monitoring Suppl (ONE TOUCH ULTRA SYSTEM KIT) W/DEVICE KIT 1 kit by Does not apply route once. 1 each 0  . cholecalciferol (VITAMIN D) 1000 UNITS tablet Take 1,000 Units by mouth 2 (two) times a week. Takes on Mon and Fri    . CINNAMON PO Take by mouth 2 (two) times daily.     . fenofibrate 160 MG tablet Take 1 tablet (160 mg total) by mouth daily. 90 tablet 3  . fish oil-omega-3 fatty acids 1000 MG capsule Take 3 g by mouth daily.     . furosemide (LASIX) 20 MG tablet Take 1 tablet (20 mg total) by mouth daily. 90 tablet 3  .  glucose blood test strip Use as instructed 100 each 3  . lisinopril (PRINIVIL,ZESTRIL) 40 MG tablet Take 0.5 tablets (20 mg total) by mouth daily. 90 tablet 3  . metoprolol (LOPRESSOR) 50 MG tablet Take 1 tablet (50 mg total) by mouth 2 (two) times daily. 180 tablet 3  . Multiple Vitamin (DAILY MULTIVITAMIN PO) Take 1 tablet by mouth daily.      [name] DELICA LANCETS FINE MISC 1 each by Does not apply route daily. 100 each 3  . rosuvastatin (CRESTOR) 40 MG tablet Take 1 tablet (40 mg total) by mouth daily. 90 tablet 3   No current facility-administered medications on file prior to visit.    Allergies  Allergen Reactions  . Isosorbide Mononitrate Other (See Comments)    Unknown   . Quinidine Gluconate Other (See Comments)    Unknown  . Ramipril Other (See Comments)    Unknown   Social History   Social History  . Marital status: Married    Spouse name: N/A  . Number of children: 5  . Years of education: N/A   Occupational History  . [employer] Retired   Social History Main Topics  . Smoking status: Never Smoker  . Smokeless tobacco: Former Systems developer  . Alcohol use No  . Drug use: No  . Sexual activity: No   Other Topics Concern  . 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      Objective:   Physical Exam  Cardiovascular: Normal rate, regular rhythm and normal heart sounds.   Pulmonary/Chest: Breath sounds normal. No respiratory distress. He has no wheezes. He has no rales.  Vitals reviewed.         Assessment & Plan:  Diabetic ulcer on the tip of the left third toe.  Now healed. We discussed modifying his orthotic to prevent this from recurring. I explained to the patient that this is a pressure sore. Therefore I recommended taking his orthotic out and removing material were the toe is rubbing to  create a pocket/pad for this toe.

## 2016-09-14 ENCOUNTER — Encounter: Payer: Self-pay | Admitting: Cardiology

## 2016-09-14 ENCOUNTER — Ambulatory Visit (INDEPENDENT_AMBULATORY_CARE_PROVIDER_SITE_OTHER): Payer: PPO | Admitting: Cardiology

## 2016-09-14 VITALS — BP 144/82 | HR 60 | Ht 65.0 in | Wt 189.0 lb

## 2016-09-14 DIAGNOSIS — I10 Essential (primary) hypertension: Secondary | ICD-10-CM | POA: Diagnosis not present

## 2016-09-14 DIAGNOSIS — I2510 Atherosclerotic heart disease of native coronary artery without angina pectoris: Secondary | ICD-10-CM

## 2016-09-14 DIAGNOSIS — I252 Old myocardial infarction: Secondary | ICD-10-CM | POA: Diagnosis not present

## 2016-09-14 DIAGNOSIS — E7800 Pure hypercholesterolemia, unspecified: Secondary | ICD-10-CM | POA: Diagnosis not present

## 2016-09-14 MED ORDER — AMLODIPINE BESYLATE 2.5 MG PO TABS
2.5000 mg | ORAL_TABLET | Freq: Every day | ORAL | 3 refills | Status: DC
Start: 2016-09-14 — End: 2016-10-28

## 2016-09-14 NOTE — Patient Instructions (Signed)
Medication Instructions:   START AMLODIPINE 2.5 MG ONCE DAILY AT BEDTIME  Follow-Up:  Your physician wants you to follow-up in: [place] will receive a reminder letter in the mail two months in advance. If you don't receive a letter, please call our office to schedule the follow-up appointment.    If you need a refill on your cardiac medications before your next appointment, please call your pharmacy.

## 2016-09-18 ENCOUNTER — Ambulatory Visit (INDEPENDENT_AMBULATORY_CARE_PROVIDER_SITE_OTHER): Payer: PPO | Admitting: Podiatry

## 2016-09-18 ENCOUNTER — Encounter: Payer: Self-pay | Admitting: Podiatry

## 2016-09-18 DIAGNOSIS — Q828 Other specified congenital malformations of skin: Secondary | ICD-10-CM | POA: Diagnosis not present

## 2016-09-18 DIAGNOSIS — E1140 Type 2 diabetes mellitus with diabetic neuropathy, unspecified: Secondary | ICD-10-CM

## 2016-09-18 DIAGNOSIS — B351 Tinea unguium: Secondary | ICD-10-CM | POA: Diagnosis not present

## 2016-09-18 DIAGNOSIS — M79676 Pain in unspecified toe(s): Secondary | ICD-10-CM

## 2016-09-18 NOTE — Progress Notes (Signed)
Patient ID: Eric [name]., male   DOB: [birth year], 83 y.o.   MRN: [id number] Complaint:  Visit Type: Patient returns to my office for continued preventative foot care services. Complaint: Patient states" my nails have grown long and thick and become painful to walk and wear shoes" Patient has been diagnosed with DM with neuropathy.. The patient presents for preventative foot care services. No changes to ROS.  He has painful callus under the outside ball of both feet.  He has picked up his diabetic shoes and has been wearing the shoes without problems.    Podiatric Exam: Vascular: dorsalis pedis and posterior tibial pulses are mildly palpable   bilateral. Capillary return is immediate. Temperature gradient is WNL. Skin turgor WNL  Sensorium: Absent  Semmes Weinstein monofilament test. Normal tactile sensation bilaterally. Nail Exam: Pt has thick disfigured discolored nails with subungual debris noted bilateral entire nail hallux through fifth toenails  Orthopedic Exam: Muscle tone and strength are WNL. No limitations in general ROM. No crepitus or effusions noted. Foot type and digits show no abnormalities. Bony prominences are unremarkable. Initiate diabetic shoe paperwork for DPN and pre-ulcer skin lesion sub 5th  B/L Skin: Porokeratosis sub 5th metatarsal left foot.  There is porokeratotic lesion sub 5th met B/L.[name] Distal clavi 3rd toe left foot .  Diagnosis:  Onychomycosis, , Pain in right toe, pain in left toes,  Porokeratosis sub 5th met B/L. Distal clavi 3rd toe left foot.  Treatment & Plan Procedures and Treatment: Consent by patient was obtained for treatment procedures. The patient understood the discussion of treatment and procedures well. All questions were answered thoroughly reviewed. Debridement of mycotic and hypertrophic toenails, 1 through 5 bilateral and clearing of subungual debris. No ulceration, no infection noted.  Debridement of porokeratosis and clavi.  Return Visit-Office  Procedure: Patient instructed to return to the office for a follow up visit 10 weeks. for continued evaluation and treatment.    Gardiner Barefoot DPM

## 2016-10-28 ENCOUNTER — Telehealth: Payer: Self-pay

## 2016-10-28 DIAGNOSIS — I2510 Atherosclerotic heart disease of native coronary artery without angina pectoris: Secondary | ICD-10-CM

## 2016-10-28 DIAGNOSIS — I252 Old myocardial infarction: Principal | ICD-10-CM

## 2016-10-28 MED ORDER — AMLODIPINE BESYLATE 5 MG PO TABS: 5.0000 mg | ORAL_TABLET | Freq: Every day | ORAL | 3 refills | Status: DC

## 2016-10-28 NOTE — Telephone Encounter (Signed)
Pt wife (ok per pt) notified she will have pt take 2 pills of current amlodipine 2.5mg  then when finished and receive new rx just 1 tab. Pt notified

## 2016-10-28 NOTE — Telephone Encounter (Signed)
Increase amlodipine to 5 mg daily and follow Kirk Ruths

## 2016-10-28 NOTE — Telephone Encounter (Signed)
Lm2cb Per e-mail sent on wife's account: I am sending this message concerning Eric [name]. When he was in the office last time Dr. Stanford Breed gave him another Medication for his blood pressure. His blood pressure still checks in the morning 200+over 100+. It does come down some durning the day but not normal. I wonder if there is more that can be done  Pt denies any other symptoms, nausea, vomiting, chest pain or pressure, shortness of breath, no lightheaded or dizziness

## 2016-10-28 NOTE — Telephone Encounter (Signed)
Spoke with pt/wife states that pt's BP has been high ever since las OV. Pt denies any symptoms, nausea, vomiting, chest pain or pressure, shortness of breath, no headache, lightheaded or dizziness(only when bend over), states that he is taking all medications as ordered.  BP has been running  today@ 1215pm 162/80 This morn 220/108 HR 54 Yesterday 225/105 HR 54                  176/87 HR 54  Please advise

## 2016-11-06 ENCOUNTER — Ambulatory Visit (INDEPENDENT_AMBULATORY_CARE_PROVIDER_SITE_OTHER): Payer: PPO | Admitting: Vascular Surgery

## 2016-11-06 ENCOUNTER — Encounter (INDEPENDENT_AMBULATORY_CARE_PROVIDER_SITE_OTHER): Payer: PPO

## 2016-11-09 ENCOUNTER — Telehealth: Payer: Self-pay | Admitting: *Deleted

## 2016-11-09 DIAGNOSIS — I2510 Atherosclerotic heart disease of native coronary artery without angina pectoris: Secondary | ICD-10-CM

## 2016-11-09 DIAGNOSIS — I252 Old myocardial infarction: Principal | ICD-10-CM

## 2016-11-09 MED ORDER — AMLODIPINE BESYLATE 10 MG PO TABS: 10.0000 mg | ORAL_TABLET | Freq: Every day | ORAL | 3 refills | Status: DC

## 2016-11-09 NOTE — Telephone Encounter (Signed)
Increase norvasc to 10 mg daily Kirk Ruths

## 2016-11-09 NOTE — Telephone Encounter (Signed)
Spoke with wife she states that pt has been taking his medications as ordered and his BP is still runnig high  Today 9am  155/73 HR 52,  8am 195/93 HR 58.  Yesterday 7am 169/82 HR 56, 8am 205/92 HR 55, 11 am 176/92 hr 49, 8pm 156/77 hr 60.  Wife states that pt is not having any other sx was just wondering if he should continue the amlodipine 5mg  daily.please advise

## 2016-11-09 NOTE — Telephone Encounter (Signed)
called about  Amlodipine to see about adjustments  bc his BP is still running high in the 200's please give [name] a call as the husband cant hear well,

## 2016-11-09 NOTE — Telephone Encounter (Signed)
Spoke with pt wife, Aware of dr crenshaw's recommendations.  New script sent to the pharmacy  

## 2016-11-12 ENCOUNTER — Other Ambulatory Visit: Payer: Self-pay | Admitting: Family Medicine

## 2016-11-12 MED ORDER — LISINOPRIL 40 MG PO TABS: 20.0000 mg | ORAL_TABLET | Freq: Every day | ORAL | 1 refills | Status: DC

## 2016-11-12 NOTE — Telephone Encounter (Signed)
Medication refilled per protocol. 

## 2016-11-13 ENCOUNTER — Other Ambulatory Visit: Payer: Self-pay | Admitting: Cardiology

## 2016-11-17 ENCOUNTER — Other Ambulatory Visit: Payer: Self-pay | Admitting: Family Medicine

## 2016-11-17 NOTE — Telephone Encounter (Signed)
Pharm calling to clarify Lisinopril prescription.  Pt is ordered Lisinopril 40 mg to take 1/2 tablet (20mg ) daily.  Pharm called and clarified.

## 2016-11-19 ENCOUNTER — Encounter: Payer: Self-pay | Admitting: Family Medicine

## 2016-11-19 MED ORDER — LISINOPRIL 40 MG PO TABS: 20.0000 mg | ORAL_TABLET | Freq: Every day | ORAL | 1 refills | Status: DC

## 2016-11-25 ENCOUNTER — Encounter: Payer: Self-pay | Admitting: Family Medicine

## 2016-11-26 MED ORDER — LISINOPRIL 40 MG PO TABS: 40.0000 mg | ORAL_TABLET | Freq: Every day | ORAL | 1 refills | Status: DC

## 2016-11-27 ENCOUNTER — Encounter: Payer: Self-pay | Admitting: Podiatry

## 2016-11-27 ENCOUNTER — Ambulatory Visit (INDEPENDENT_AMBULATORY_CARE_PROVIDER_SITE_OTHER): Payer: PPO | Admitting: Podiatry

## 2016-11-27 DIAGNOSIS — B351 Tinea unguium: Secondary | ICD-10-CM

## 2016-11-27 DIAGNOSIS — M79676 Pain in unspecified toe(s): Secondary | ICD-10-CM

## 2016-11-27 DIAGNOSIS — Q828 Other specified congenital malformations of skin: Secondary | ICD-10-CM

## 2016-11-27 NOTE — Progress Notes (Signed)
Patient ID: Eric Tisdel., male   DOB: 1927-10-30, 83 y.o.   MRN: [id number] Complaint:  Visit Type: Patient returns to my office for continued preventative foot care services. Complaint: Patient states" my nails have grown long and thick and become painful to walk and wear shoes" Patient has been diagnosed with DM with neuropathy.. The patient presents for preventative foot care services. No changes to ROS.  He has painful callus under the outside ball of both feet.    Podiatric Exam: Vascular: dorsalis pedis and posterior tibial pulses are mildly palpable   bilateral. Capillary return is immediate. Temperature gradient is WNL. Skin turgor WNL  Sensorium: Absent  Semmes Weinstein monofilament test. Normal tactile sensation bilaterally. Nail Exam: Pt has thick disfigured discolored nails with subungual debris noted bilateral entire nail hallux through fifth toenails  Orthopedic Exam: Muscle tone and strength are WNL. No limitations in general ROM. No crepitus or effusions noted. Foot type and digits show no abnormalities.  Skin:   There is porokeratotic lesion sub 5th met B/L. Corn 5th toe left foot.  Diagnosis:  Onychomycosis, , Pain in right toe, pain in left toes,  Porokeratosis sub 5th met B/L. Distal clavi 3rd toe left foot.  Treatment & Plan Procedures and Treatment: Consent by patient was obtained for treatment procedures. The patient understood the discussion of treatment and procedures well. All questions were answered thoroughly reviewed. Debridement of mycotic and hypertrophic toenails, 1 through 5 bilateral and clearing of subungual debris. No ulceration, no infection noted.  Debridement of porokeratosis   Return Visit-Office Procedure: Patient instructed to return to the office for a follow up visit 10 weeks. for continued evaluation and treatment. Check on diabetic paperwork.    Gardiner Barefoot DPM

## 2016-12-09 ENCOUNTER — Encounter: Payer: Self-pay | Admitting: Family Medicine

## 2016-12-16 ENCOUNTER — Other Ambulatory Visit: Payer: Self-pay | Admitting: Family Medicine

## 2016-12-16 MED ORDER — FUROSEMIDE 20 MG PO TABS: 20.0000 mg | ORAL_TABLET | Freq: Every day | ORAL | 3 refills | Status: DC

## 2016-12-22 ENCOUNTER — Ambulatory Visit (INDEPENDENT_AMBULATORY_CARE_PROVIDER_SITE_OTHER): Payer: PPO

## 2016-12-22 ENCOUNTER — Ambulatory Visit (INDEPENDENT_AMBULATORY_CARE_PROVIDER_SITE_OTHER): Payer: PPO | Admitting: Vascular Surgery

## 2016-12-22 ENCOUNTER — Encounter (INDEPENDENT_AMBULATORY_CARE_PROVIDER_SITE_OTHER): Payer: Self-pay | Admitting: Vascular Surgery

## 2016-12-22 ENCOUNTER — Other Ambulatory Visit (INDEPENDENT_AMBULATORY_CARE_PROVIDER_SITE_OTHER): Payer: Self-pay | Admitting: Vascular Surgery

## 2016-12-22 VITALS — BP 119/63 | HR 49 | Resp 16 | Wt 187.0 lb

## 2016-12-22 DIAGNOSIS — I10 Essential (primary) hypertension: Secondary | ICD-10-CM

## 2016-12-22 DIAGNOSIS — I739 Peripheral vascular disease, unspecified: Secondary | ICD-10-CM

## 2016-12-22 DIAGNOSIS — E0940 Drug or chemical induced diabetes mellitus with neurological complications with diabetic neuropathy, unspecified: Secondary | ICD-10-CM | POA: Diagnosis not present

## 2016-12-22 NOTE — Assessment & Plan Note (Signed)
blood glucose control important in reducing the progression of atherosclerotic disease. Also, involved in wound healing. On appropriate medications.  

## 2016-12-22 NOTE — Assessment & Plan Note (Signed)
His ABIs today are normal at 1.04 on the right and 1.13 on the left. The right anterior tibial artery has a hemodynamically significant stenosis, but the remainder of the tibial vessels appear to have pretty good flow as can be identified. All vessels do have significant medial calcification. Although his digital pressures are decreased, his waveforms are pretty good. His perfusion is currently fairly good and no intervention would be required. He does not have any current limb threatening symptoms. Plan to recheck in 1 year.

## 2016-12-22 NOTE — Progress Notes (Signed)
MRN : [id number]  Eric [name]. is a 83 y.o. (February 07, 1928) male who presents with chief complaint of  Chief Complaint  Patient presents with  . Follow-up  .  History of Present Illness: Patient returns today in follow up of PAD. He is doing well and currently has no ischemic rest pain, ulceration, or infection. He denies lifestyle limiting claudication and has only mild calf pain with activity. He has undergone previous revascularization in the past, but none recently. His ABIs today are normal at 1.04 on the right and 1.13 on the left. The right anterior tibial artery has a hemodynamically significant stenosis, but the remainder of the tibial vessels appear to have pretty good flow as can be identified. All vessels do have significant medial calcification. Although his digital pressures are decreased, his waveforms are pretty good.  Current Outpatient Prescriptions  Medication Sig Dispense Refill  . acetaminophen (TYLENOL) 500 MG tablet Take 1,000 mg by mouth every 8 (eight) hours as needed for mild pain.     [name] amLODipine (NORVASC) 10 MG tablet Take 1 tablet (10 mg total) by mouth at bedtime. 90 tablet 3  . apixaban (ELIQUIS) 2.5 MG TABS tablet Take 1 tablet (2.5 mg total) by mouth 2 (two) times daily. 60 tablet 0  . B Complex Vitamins (B COMPLEX-B12) TABS Take 1 tablet by mouth daily.     . Blood Glucose Monitoring Suppl (ONE TOUCH ULTRA SYSTEM KIT) W/DEVICE KIT 1 kit by Does not apply route once. 1 each 0  . cholecalciferol (VITAMIN D) 1000 UNITS tablet Take 1,000 Units by mouth 2 (two) times a week. Takes on Mon and Fri    . CINNAMON PO Take by mouth 2 (two) times daily.     [name] ELIQUIS 2.5 MG TABS tablet Take 1 tablet by mouth twice a day 180 tablet 0  . fenofibrate 160 MG tablet Take 1 tablet (160 mg total) by mouth daily. 90 tablet 3  . fish oil-omega-3 fatty acids 1000 MG capsule Take 3 g by mouth daily.     . furosemide (LASIX) 20 MG tablet Take 1 tablet (20 mg total) by mouth  daily. 90 tablet 3  . glucose blood test strip Use as instructed 100 each 3  . lisinopril (PRINIVIL,ZESTRIL) 40 MG tablet Take 1 tablet (40 mg total) by mouth daily. 90 tablet 1  . metoprolol (LOPRESSOR) 50 MG tablet Take 1 tablet (50 mg total) by mouth 2 (two) times daily. 180 tablet 3  . Multiple Vitamin (DAILY MULTIVITAMIN PO) Take 1 tablet by mouth daily.      [name] DELICA LANCETS FINE MISC 1 each by Does not apply route daily. 100 each 3  . rosuvastatin (CRESTOR) 40 MG tablet Take 1 tablet (40 mg total) by mouth daily. 90 tablet 3   No current facility-administered medications for this visit.     Past Medical History:  Diagnosis Date  . Atrial fibrillation ([place])   . Atrial flutter ([place])    s/p RFCA  . CAD (coronary artery disease)    cath 2003, occluded S-RCA, occluded S-Dx, L-LAD ok, s/p PTCA to LAD  . Cataract   . Diabetes mellitus   . Long term (current) use of anticoagulants   . Neuromuscular disorder ([place])   . OSA (obstructive sleep apnea) 12/11   very mild, AHI 7/hr  . Persistent atrial fibrillation ([place]) 09/26/2015  . Pure hyperglyceridemia   . PVD (peripheral vascular disease) ([place])    angioplasty of his  right lower extremity in [place] by Dr.Belenda Alviar 2013  . Unspecified essential hypertension     Past Surgical History:  Procedure Laterality Date  . Adenosine Myoview  3/06   EF 56%, neg. Ischemia  . Adenosine Myoview  02/18/07   nml  . ANGIOPLASTY  1/99   CAD- diogonal with rotational artherectomy  . Arthrectomy     of LAD & PTCA  . CARDIAC CATHETERIZATION  1/00  . CARDIOVERSION  1/04  . CARDIOVERSION  5/07   hospital- a flutter  . CARPAL TUNNEL RELEASE     ? bilateral  . COLONOSCOPY W/ BIOPSIES  10/01/06   sigmoid polyp bx neg, 3 years  . CORONARY ANGIOPLASTY  4/03   cutting balloon PTCA pLAD into Diag  . CORONARY ARTERY BYPASS GRAFT  2000   LIMA-LAD, SVG-RCA, SVG-Diag; SVG-Diag & SVG-RCA occluded 2003  . FRACTURE SURGERY    . HAND SURGERY  08/27/09     R thumb procedure wit Scaphoid Gragt and screws, Dr [name]  . NM MYOVIEW LTD  4/11   normal  . PERIPHERAL VASCULAR CATHETERIZATION Right 06/27/2015   Procedure: Lower Extremity Angiography;  Surgeon: Algernon [name], MD;  Location: [facility];  Service: Cardiovascular;  Laterality: Right;  . PERIPHERAL VASCULAR CATHETERIZATION  06/27/2015   Procedure: Lower Extremity Intervention;  Surgeon: Algernon [name], MD;  Location: [facility];  Service: Cardiovascular;;    Social History Social History  Substance Use Topics  . Smoking status: Never Smoker  . Smokeless tobacco: Former Systems developer  . Alcohol use No     Family History Family History  Problem Relation Age of Onset  . Stroke Father   . Aneurysm Father   . Hypertension Father   . Prostate cancer Brother   . Hypertension Mother   . Lung cancer Brother        smoker  . Thrombosis Brother   . Other Brother        RF valve disorder (smoker)  . Lung cancer Brother        smoker  . Breast cancer Sister   . Liver cancer Brother   . Other Sister        cerebral hemorrhage    Allergies  Allergen Reactions  . Isosorbide Mononitrate Other (See Comments)    Unknown   . Quinidine Gluconate Other (See Comments)    Unknown  . Ramipril Other (See Comments)    Unknown     REVIEW OF SYSTEMS (Negative unless checked)  Constitutional: '[]' Weight loss  '[]' Fever  '[]' Chills Cardiac: '[]' Chest pain   '[]' Chest pressure   '[x]' Palpitations   '[]' Shortness of breath when laying flat   '[]' Shortness of breath at rest   '[x]' Shortness of breath with exertion. Vascular:  '[x]' Pain in legs with walking   '[]' Pain in legs at rest   '[]' Pain in legs when laying flat   '[x]' Claudication   '[]' Pain in feet when walking  '[]' Pain in feet at rest  '[]' Pain in feet when laying flat   '[]' History of DVT   '[]' Phlebitis   '[]' Swelling in legs   '[]' Varicose veins   '[]' Non-healing ulcers Pulmonary:   '[]' Uses home oxygen   '[]' Productive cough   '[]' Hemoptysis   '[]' Wheeze  '[]' COPD    '[]' Asthma Neurologic:  '[]' Dizziness  '[]' Blackouts   '[]' Seizures   '[]' History of stroke   '[]' History of TIA  '[]' Aphasia   '[]' Temporary blindness   '[]' Dysphagia   '[]' Weakness or numbness in arms   '[x]' Weakness or numbness in legs  Musculoskeletal:  '[]' Arthritis   '[]' Joint swelling   '[]' Joint pain   '[]' Low back pain Hematologic:  '[]' Easy bruising  '[]' Easy bleeding   '[]' Hypercoagulable state   '[]' Anemic   Gastrointestinal:  '[]' Blood in stool   '[]' Vomiting blood  '[]' Gastroesophageal reflux/heartburn   '[]' Abdominal pain Genitourinary:  '[]' Chronic kidney disease   '[]' Difficult urination  '[]' Frequent urination  '[]' Burning with urination   '[]' Hematuria Skin:  '[]' Rashes   '[]' Ulcers   '[]' Wounds Psychological:  '[]' History of anxiety   '[]'  History of major depression.  Physical Examination  BP 119/63   Pulse (!) 49   Resp 16   Wt 187 lb (84.8 kg)   BMI 31.12 kg/m  Gen:  WD/WN, NAD. Appears younger than stated age. Head: [place]/AT, No temporalis wasting. Ear/Nose/Throat: Hearing grossly intact, nares w/o erythema or drainage, trachea midline Eyes: Conjunctiva clear. Sclera non-icteric Neck: Supple.  No JVD.  Pulmonary:  Good air movement, no use of accessory muscles.  Cardiac: Bradycardic and irregular Vascular:  Vessel Right Left  Radial Palpable Palpable  Ulnar Palpable Palpable  Brachial Palpable Palpable  Carotid Palpable, without bruit Palpable, without bruit  Aorta Not palpable N/A  Femoral Palpable Palpable  Popliteal Palpable Palpable  PT Palpable Palpable  DP Not Palpable Palpable   Gastrointestinal: soft, non-tender/non-distended.  Musculoskeletal: M/S 5/5 throughout.  No deformity or atrophy. 1-2+ BLE edema. Neurologic: Sensation grossly intact in extremities.  Symmetrical.  Speech is fluent.  Psychiatric: Judgment intact, Mood & affect appropriate for pt's clinical situation. Dermatologic: No rashes or ulcers noted.  No cellulitis or open wounds.       Labs No results found for this or any previous visit  (from the past 2160 hour(s)).  Radiology No results found.    Assessment/Plan  Diabetes mellitus with neuropathy blood glucose control important in reducing the progression of atherosclerotic disease. Also, involved in wound healing. On appropriate medications.   Essential hypertension blood pressure control important in reducing the progression of atherosclerotic disease. On appropriate oral medications.   PVD (peripheral vascular disease) His ABIs today are normal at 1.04 on the right and 1.13 on the left. The right anterior tibial artery has a hemodynamically significant stenosis, but the remainder of the tibial vessels appear to have pretty good flow as can be identified. All vessels do have significant medial calcification. Although his digital pressures are decreased, his waveforms are pretty good. His perfusion is currently fairly good and no intervention would be required. He does not have any current limb threatening symptoms. Plan to recheck in 1 year.    Leotis Pain, MD  12/22/2016 1:06 PM    This note was created with Dragon medical transcription system.  Any errors from dictation are purely unintentional

## 2016-12-22 NOTE — Patient Instructions (Signed)
Peripheral Vascular Disease Peripheral vascular disease (PVD) is a disease of the blood vessels that are not part of your heart and brain. A simple term for PVD is poor circulation. In most cases, PVD narrows the blood vessels that carry blood from your heart to the rest of your body. This can result in a decreased supply of blood to your arms, legs, and internal organs, like your stomach or kidneys. However, it most often affects a person's lower legs and feet. There are two types of PVD.  Organic PVD. This is the more common type. It is caused by damage to the structure of blood vessels.  Functional PVD. This is caused by conditions that make blood vessels contract and tighten (spasm).  Without treatment, PVD tends to get worse over time. PVD can also lead to acute ischemic limb. This is when an arm or limb suddenly has trouble getting enough blood. This is a medical emergency. What are the causes? Each type of PVD has many different causes. The most common cause of PVD is buildup of a fatty material (plaque) inside of your arteries (atherosclerosis). Small amounts of plaque can break off from the walls of the blood vessels and become lodged in a smaller artery. This blocks blood flow and can cause acute ischemic limb. Other common causes of PVD include:  Blood clots that form inside of blood vessels.  Injuries to blood vessels.  Diseases that cause inflammation of blood vessels or cause blood vessel spasms.  Health behaviors and health history that increase your risk of developing PVD.  What increases the risk? You may have a greater risk of PVD if you:  Have a family history of PVD.  Have certain medical conditions, including: ? High cholesterol. ? Diabetes. ? High blood pressure (hypertension). ? Coronary heart disease. ? Past problems with blood clots. ? Past injury, such as burns or a broken bone. These may have damaged blood vessels in your limbs. ? Buerger disease. This is  caused by inflamed blood vessels in your hands and feet. ? Some forms of arthritis. ? Rare birth defects that affect the arteries in your legs.  Use tobacco.  Do not get enough exercise.  Are obese.  Are age 50 or older.  What are the signs or symptoms? PVD may cause many different symptoms. Your symptoms depend on what part of your body is not getting enough blood. Some common signs and symptoms include:  Cramps in your lower legs. This may be a symptom of poor leg circulation (claudication).  Pain and weakness in your legs while you are physically active that goes away when you rest (intermittent claudication).  Leg pain when at rest.  Leg numbness, tingling, or weakness.  Coldness in a leg or foot, especially when compared with the other leg.  Skin or hair changes. These can include: ? Hair loss. ? Shiny skin. ? Pale or bluish skin. ? Thick toenails.  Inability to get or maintain an erection (erectile dysfunction).  People with PVD are more prone to developing ulcers and sores on their toes, feet, or legs. These may take longer than normal to heal. How is this diagnosed? Your health care provider may diagnose PVD from your signs and symptoms. The health care provider will also do a physical exam. You may have tests to find out what is causing your PVD and determine its severity. Tests may include:  Blood pressure recordings from your arms and legs and measurements of the strength of your pulses (  pulse volume recordings).  Imaging studies using sound waves to take pictures of the blood flow through your blood vessels (Doppler ultrasound).  Injecting a dye into your blood vessels before having imaging studies using: ? X-rays (angiogram or arteriogram). ? Computer-generated X-rays (CT angiogram). ? A powerful electromagnetic field and a computer (magnetic resonance angiogram or MRA).  How is this treated? Treatment for PVD depends on the cause of your condition and the  severity of your symptoms. It also depends on your age. Underlying causes need to be treated and controlled. These include long-lasting (chronic) conditions, such as diabetes, high cholesterol, and high blood pressure. You may need to first try making lifestyle changes and taking medicines. Surgery may be needed if these do not work. Lifestyle changes may include:  Quitting smoking.  Exercising regularly.  Following a low-fat, low-cholesterol diet.  Medicines may include:  Blood thinners to prevent blood clots.  Medicines to improve blood flow.  Medicines to improve your blood cholesterol levels.  Surgical procedures may include:  A procedure that uses an inflated balloon to open a blocked artery and improve blood flow (angioplasty).  A procedure to put in a tube (stent) to keep a blocked artery open (stent implant).  Surgery to reroute blood flow around a blocked artery (peripheral bypass surgery).  Surgery to remove dead tissue from an infected wound on the affected limb.  Amputation. This is surgical removal of the affected limb. This may be necessary in cases of acute ischemic limb that are not improved through medical or surgical treatments.  Follow these instructions at home:  Take medicines only as directed by your health care provider.  Do not use any tobacco products, including cigarettes, chewing tobacco, or electronic cigarettes. If you need help quitting, ask your health care provider.  Lose weight if you are overweight, and maintain a healthy weight as directed by your health care provider.  Eat a diet that is low in fat and cholesterol. If you need help, ask your health care provider.  Exercise regularly. Ask your health care provider to suggest some good activities for you.  Use compression stockings or other mechanical devices as directed by your health care provider.  Take good care of your feet. ? Wear comfortable shoes that fit well. ? Check your feet  often for any cuts or sores. Contact a health care provider if:  You have cramps in your legs while walking.  You have leg pain when you are at rest.  You have coldness in a leg or foot.  Your skin changes.  You have erectile dysfunction.  You have cuts or sores on your feet that are not healing. Get help right away if:  Your arm or leg turns cold and blue.  Your arms or legs become red, warm, swollen, painful, or numb.  You have chest pain or trouble breathing.  You suddenly have weakness in your face, arm, or leg.  You become very confused or lose the ability to speak.  You suddenly have a very bad headache or lose your vision. This information is not intended to replace advice given to you by your health care provider. Make sure you discuss any questions you have with your health care provider. Document Released: 09/03/2004 Document Revised: 01/02/2016 Document Reviewed: 01/04/2014 Elsevier Interactive Patient Education  2017 [employer]  

## 2016-12-22 NOTE — Assessment & Plan Note (Signed)
blood pressure control important in reducing the progression of atherosclerotic disease. On appropriate oral medications.  

## 2017-02-19 ENCOUNTER — Ambulatory Visit: Payer: PPO | Admitting: Family Medicine

## 2017-02-22 ENCOUNTER — Other Ambulatory Visit: Payer: Self-pay | Admitting: Family Medicine

## 2017-02-22 DIAGNOSIS — I482 Chronic atrial fibrillation, unspecified: Secondary | ICD-10-CM

## 2017-02-22 MED ORDER — METOPROLOL TARTRATE 50 MG PO TABS: 50.0000 mg | ORAL_TABLET | Freq: Two times a day (BID) | ORAL | 3 refills | Status: DC

## 2017-02-26 ENCOUNTER — Encounter: Payer: Self-pay | Admitting: Podiatry

## 2017-02-26 ENCOUNTER — Ambulatory Visit (INDEPENDENT_AMBULATORY_CARE_PROVIDER_SITE_OTHER): Payer: PPO | Admitting: Podiatry

## 2017-02-26 DIAGNOSIS — E1151 Type 2 diabetes mellitus with diabetic peripheral angiopathy without gangrene: Secondary | ICD-10-CM | POA: Diagnosis not present

## 2017-02-26 DIAGNOSIS — M2021 Hallux rigidus, right foot: Secondary | ICD-10-CM

## 2017-02-26 DIAGNOSIS — Q828 Other specified congenital malformations of skin: Secondary | ICD-10-CM

## 2017-02-26 DIAGNOSIS — M79676 Pain in unspecified toe(s): Secondary | ICD-10-CM

## 2017-02-26 DIAGNOSIS — M205X1 Other deformities of toe(s) (acquired), right foot: Secondary | ICD-10-CM

## 2017-02-26 DIAGNOSIS — M205X2 Other deformities of toe(s) (acquired), left foot: Secondary | ICD-10-CM

## 2017-02-26 DIAGNOSIS — B351 Tinea unguium: Secondary | ICD-10-CM

## 2017-02-26 DIAGNOSIS — E1142 Type 2 diabetes mellitus with diabetic polyneuropathy: Secondary | ICD-10-CM

## 2017-02-26 NOTE — Progress Notes (Addendum)
Patient ID: Eric Lantzy., male   DOB: Dec 18, 1927, 83 y.o.   MRN: [id number] Complaint:  Visit Type: Patient returns to my office for continued preventative foot care services. Complaint: Patient states" my nails have grown long and thick and become painful to walk and wear shoes" Patient has been diagnosed with DM with neuropathy.. The patient presents for preventative foot care services. No changes to ROS.  He has painful callus under the outside ball of both feet.    Podiatric Exam: Vascular: dorsalis pedis and posterior tibial pulses are mildly palpable   bilateral. Capillary return is immediate. Temperature gradient is WNL. Skin turgor WNL  Sensorium: Absent  Semmes Weinstein monofilament test. Normal tactile sensation bilaterally. Nail Exam: Pt has thick disfigured discolored nails with subungual debris noted bilateral entire nail hallux through fifth toenails  Orthopedic Exam: Muscle tone and strength are WNL. No limitations in general ROM. No crepitus or effusions noted. Foot type and digits show no abnormalities.  Skin:   There is porokeratotic lesion sub 5th met B/L. Corn 5th toe left foot.  Diagnosis:  Onychomycosis, , Pain in right toe, pain in left toes,  Porokeratosis sub 5th met B/L. Distal clavi 3rd toe left foot.  Treatment & Plan Procedures and Treatment: Consent by patient was obtained for treatment procedures. The patient understood the discussion of treatment and procedures well. All questions were answered thoroughly reviewed. Debridement of mycotic and hypertrophic toenails, 1 through 5 bilateral and clearing of subungual debris. No ulceration, no infection noted.  Debridement of porokeratosis  .  Initiate diabetic footgear for DPN and hallux limitus  B/L Return Visit-Office Procedure: Patient instructed to return to the office for a follow up visit 10 weeks. for continued evaluation and treatment. Check on diabetic paperwork.    Gardiner Barefoot DPM

## 2017-03-01 ENCOUNTER — Encounter: Payer: Self-pay | Admitting: Family Medicine

## 2017-03-01 ENCOUNTER — Ambulatory Visit (INDEPENDENT_AMBULATORY_CARE_PROVIDER_SITE_OTHER): Payer: PPO | Admitting: Family Medicine

## 2017-03-01 VITALS — BP 134/60 | HR 56 | Temp 97.6°F | Resp 16 | Ht 65.0 in | Wt 182.0 lb

## 2017-03-01 DIAGNOSIS — N183 Chronic kidney disease, stage 3 unspecified: Secondary | ICD-10-CM

## 2017-03-01 DIAGNOSIS — I2510 Atherosclerotic heart disease of native coronary artery without angina pectoris: Secondary | ICD-10-CM

## 2017-03-01 DIAGNOSIS — E119 Type 2 diabetes mellitus without complications: Secondary | ICD-10-CM

## 2017-03-01 DIAGNOSIS — I739 Peripheral vascular disease, unspecified: Secondary | ICD-10-CM

## 2017-03-01 DIAGNOSIS — E7800 Pure hypercholesterolemia, unspecified: Secondary | ICD-10-CM | POA: Diagnosis not present

## 2017-03-01 DIAGNOSIS — I10 Essential (primary) hypertension: Secondary | ICD-10-CM

## 2017-03-01 LAB — COMPLETE METABOLIC PANEL WITH GFR
ALBUMIN: 4.2 g/dL (ref 3.6–5.1)
ALK PHOS: 28 U/L — AB (ref 40–115)
ALT: 20 U/L (ref 9–46)
AST: 35 U/L (ref 10–35)
BUN: 24 mg/dL (ref 7–25)
CHLORIDE: 105 mmol/L (ref 98–110)
CO2: 25 mmol/L (ref 20–31)
Calcium: 9.7 mg/dL (ref 8.6–10.3)
Creat: 1.63 mg/dL — ABNORMAL HIGH (ref 0.70–1.11)
GFR, EST NON AFRICAN AMERICAN: 38 mL/min — AB (ref >=60)
GFR, Est African American: 43 mL/min — ABNORMAL LOW (ref >=60)
GLUCOSE: 111 mg/dL — AB (ref 70–99)
POTASSIUM: 5.2 mmol/L (ref 3.5–5.3)
SODIUM: 138 mmol/L (ref 135–146)
Total Bilirubin: 0.7 mg/dL (ref 0.2–1.2)
Total Protein: 7.1 g/dL (ref 6.1–8.1)

## 2017-03-01 LAB — CBC WITH DIFFERENTIAL/PLATELET
BASOS ABS: 91 {cells}/uL (ref 0–200)
Basophils Relative: 1 %
EOS PCT: 3 %
Eosinophils Absolute: 273 cells/uL (ref 15–500)
HCT: 37.7 % — ABNORMAL LOW (ref 38.5–50.0)
Hemoglobin: 11.9 g/dL — ABNORMAL LOW (ref 13.0–17.0)
LYMPHS PCT: 33 %
Lymphs Abs: 3003 cells/uL (ref 850–3900)
MCH: 30.7 pg (ref 27.0–33.0)
MCHC: 31.6 g/dL — AB (ref 32.0–36.0)
MCV: 97.4 fL (ref 80.0–100.0)
MPV: 10.6 fL (ref 7.5–12.5)
Monocytes Absolute: 637 cells/uL (ref 200–950)
Monocytes Relative: 7 %
NEUTROS PCT: 56 %
Neutro Abs: 5096 cells/uL (ref 1500–7800)
PLATELETS: 196 10*3/uL (ref 140–400)
RBC: 3.87 MIL/uL — ABNORMAL LOW (ref 4.20–5.80)
RDW: 16.1 % — AB (ref 11.0–15.0)
WBC: 9.1 10*3/uL (ref 3.8–10.8)

## 2017-03-01 LAB — LIPID PANEL
CHOL/HDL RATIO: 16.7 ratio — AB (ref <5.0)
Cholesterol: 100 mg/dL (ref <200)
HDL: 6 mg/dL — AB (ref >40)
LDL Cholesterol: 55 mg/dL (ref <100)
Triglycerides: 193 mg/dL — ABNORMAL HIGH (ref <150)
VLDL: 39 mg/dL — ABNORMAL HIGH (ref <30)

## 2017-03-01 NOTE — Progress Notes (Signed)
Subjective:    Patient ID: Eric [name]., male    DOB: 1927/09/21, 83 y.o.   MRN: [id number]  Medication Refill   Please see the last office visit. At the patient's last visit for.I found an ulcerative callus on the tip of his third left toe. It is approximately 1 cm in diameter. I debrided down to healthy tissue removing the hyperkeratotic tissue. He is here today for follow-up. On the tip of the left third toe is a patch approximately 1.3 cm in diameter of thick hyperkeratotic tissue. However the central ulcer appears almost completely healed. It is now 2 mm in size.  AT that time, my plan was: I debrided the hyperkeratotic tissue from around the ulcer. Ulcers possibly 2 mm in size and there is no evidence of cellulitis. Is extremely superficial. Recheck in 2 weeks and repeat process until healed  09/11/16 Here today for recheck. The ulcer has completely healed and with healthy tissue. There is still a halo of thick hyperkeratotic pre-ulcerative callus which I removed with a razor blade down to healthy tissue. There is no evidence of cellulitis. Thankfully there is no longer any skin breakdown.  At that time, my plan was: Now healed. We discussed modifying his orthotic to prevent this from recurring. I explained to the patient that this is a pressure sore. Therefore I recommended taking his orthotic out and removing material where the toe is rubbing to create a pocket/pad for this toe.  03/01/17 Here for follow up.  Patient's blood pressure today is well controlled. He denies any chest pain shortness of breath or dyspnea on exertion. He denies any angina. He denies any palpitations. He denies any syncope. He saw his podiatrist recently who made some changes in his orthotics. Diabetic foot exam is performed today and is significant for a pre-ulcerative callus on the plantar aspects of the fifth MTP joint bilaterally as well as on the tip of the left third toe similar to where he had the ulcer  previously. He denies any polyuria polydipsia or blurry vision. He denies any myalgias or right upper quadrant pain. He is compliant taking his anticoagulant. He denies any tachycardia or lightheadedness Past Medical History:  Diagnosis Date  . Atrial fibrillation ([place])   . Atrial flutter ([place])    s/p RFCA  . CAD (coronary artery disease)    cath 2003, occluded S-RCA, occluded S-Dx, L-LAD ok, s/p PTCA to LAD  . Cataract   . Diabetes mellitus   . Long term (current) use of anticoagulants   . Neuromuscular disorder ([place])   . OSA (obstructive sleep apnea) 12/11   very mild, AHI 7/hr  . Persistent atrial fibrillation ([place]) 09/26/2015  . Pure hyperglyceridemia   . PVD (peripheral vascular disease) ([place])    angioplasty of his right lower extremity in [place] by Dr.[name] 2013  . Unspecified essential hypertension    Past Surgical History:  Procedure Laterality Date  . Adenosine Myoview  3/06   EF 56%, neg. Ischemia  . Adenosine Myoview  02/18/07   nml  . ANGIOPLASTY  1/99   CAD- diogonal with rotational artherectomy  . Arthrectomy     of LAD & PTCA  . CARDIAC CATHETERIZATION  1/00  . CARDIOVERSION  1/04  . CARDIOVERSION  5/07   hospital- a flutter  . CARPAL TUNNEL RELEASE     ? bilateral  . COLONOSCOPY W/ BIOPSIES  10/01/06   sigmoid polyp bx neg, 3 years  . CORONARY ANGIOPLASTY  4/03  cutting balloon PTCA pLAD into Diag  . CORONARY ARTERY BYPASS GRAFT  2000   LIMA-LAD, SVG-RCA, SVG-Diag; SVG-Diag & SVG-RCA occluded 2003  . FRACTURE SURGERY    . HAND SURGERY  08/27/09   R thumb procedure wit Scaphoid Gragt and screws, Dr [name]  . NM MYOVIEW LTD  4/11   normal  . PERIPHERAL VASCULAR CATHETERIZATION Right 06/27/2015   Procedure: Lower Extremity Angiography;  Surgeon: Algernon [name], MD;  Location: [facility];  Service: Cardiovascular;  Laterality: Right;  . PERIPHERAL VASCULAR CATHETERIZATION  06/27/2015   Procedure: Lower Extremity Intervention;  Surgeon: Algernon [name],  MD;  Location: [facility];  Service: Cardiovascular;;   Current Outpatient Prescriptions on File Prior to Visit  Medication Sig Dispense Refill  . acetaminophen (TYLENOL) 500 MG tablet Take 1,000 mg by mouth every 8 (eight) hours as needed for mild pain.     [name] amLODipine (NORVASC) 10 MG tablet Take 1 tablet (10 mg total) by mouth at bedtime. 90 tablet 3  . apixaban (ELIQUIS) 2.5 MG TABS tablet Take 1 tablet (2.5 mg total) by mouth 2 (two) times daily. 60 tablet 0  . B Complex Vitamins (B COMPLEX-B12) TABS Take 1 tablet by mouth daily.     . Blood Glucose Monitoring Suppl (ONE TOUCH ULTRA SYSTEM KIT) W/DEVICE KIT 1 kit by Does not apply route once. 1 each 0  . cholecalciferol (VITAMIN D) 1000 UNITS tablet Take 1,000 Units by mouth 2 (two) times a week. Takes on Mon and Fri    . CINNAMON PO Take by mouth 2 (two) times daily.     [name] ELIQUIS 2.5 MG TABS tablet Take 1 tablet by mouth twice a day 180 tablet 0  . fenofibrate 160 MG tablet Take 1 tablet (160 mg total) by mouth daily. 90 tablet 3  . fish oil-omega-3 fatty acids 1000 MG capsule Take 3 g by mouth daily.     . furosemide (LASIX) 20 MG tablet Take 1 tablet (20 mg total) by mouth daily. 90 tablet 3  . glucose blood test strip Use as instructed 100 each 3  . lisinopril (PRINIVIL,ZESTRIL) 40 MG tablet Take 1 tablet (40 mg total) by mouth daily. 90 tablet 1  . metoprolol tartrate (LOPRESSOR) 50 MG tablet Take 1 tablet (50 mg total) by mouth 2 (two) times daily. 180 tablet 3  . Multiple Vitamin (DAILY MULTIVITAMIN PO) Take 1 tablet by mouth daily.      [name] DELICA LANCETS FINE MISC 1 each by Does not apply route daily. 100 each 3  . rosuvastatin (CRESTOR) 40 MG tablet Take 1 tablet (40 mg total) by mouth daily. 90 tablet 3   No current facility-administered medications on file prior to visit.    Allergies  Allergen Reactions  . Isosorbide Mononitrate Other (See Comments)    Unknown   . Quinidine Gluconate Other (See  Comments)    Unknown  . Ramipril Other (See Comments)    Unknown   Social History   Social History  . Marital status: Married    Spouse name: N/A  . Number of children: 5  . Years of education: N/A   Occupational History  . [employer] Retired   Social History Main Topics  . Smoking status: Never Smoker  . Smokeless tobacco: Former Systems developer  . Alcohol use No  . Drug use: No  . Sexual activity: No   Other Topics Concern  . Not on file  Social History Narrative   Retired now does some antiques   Retired from Theatre manager and dye work   Married 1951   5 kids      Review of [place] other systems reviewed and are negative.      Objective:   Physical Exam  Constitutional: He appears well-developed and well-nourished. No distress.  HENT:  Mouth/Throat: Oropharynx is clear and moist.  Neck: Neck supple. No JVD present. No thyromegaly present.  Cardiovascular: Normal rate and normal heart sounds.  An irregularly irregular rhythm present.  Pulmonary/Chest: Breath sounds normal. No respiratory distress. He has no wheezes. He has no rales.  Abdominal: Soft. Bowel sounds are normal. He exhibits no distension and no mass. There is no tenderness. There is no rebound and no guarding.  Musculoskeletal: He exhibits no edema.  Lymphadenopathy:    He has no cervical adenopathy.  Skin: No rash noted. He is not diaphoretic. No erythema.  Vitals reviewed.         Assessment & Plan:  Controlled type 2 diabetes mellitus without complication, without long-term current use of insulin ([place]) - Plan: CBC with Differential/Platelet, COMPLETE METABOLIC PANEL WITH GFR, Lipid panel, Microalbumin, urine, Hemoglobin A1c  Pure hypercholesterolemia - Plan: CBC with Differential/Platelet, COMPLETE METABOLIC PANEL WITH GFR, Lipid panel  Coronary artery disease involving native coronary artery of native heart without angina pectoris - Plan: CBC with Differential/Platelet, COMPLETE METABOLIC  PANEL WITH GFR, Lipid panel  PVD (peripheral vascular disease) ([facility])  Chronic kidney disease, stage III (moderate)  Essential hypertension  Blood pressure today is well controlled. I will make no changes in his antihypertensives. I will check a fasting lipid panel. Goal LDL cholesterol is less than 70. I will also check his hemoglobin A1c. I'll hemoglobin A1c is less than 6.5. Immunizations are up-to-date. Pre-ulcerative calluses have been addressed by his podiatrist by altering his orthotic. I will check a urine microalbumin. I will also monitor his renal function with a BMP.

## 2017-03-02 ENCOUNTER — Encounter: Payer: Self-pay | Admitting: Family Medicine

## 2017-03-02 LAB — HEMOGLOBIN A1C
HEMOGLOBIN A1C: 5.9 % — AB (ref <5.7)
Mean Plasma Glucose: 123 mg/dL

## 2017-03-02 LAB — MICROALBUMIN, URINE: MICROALB UR: 2.4 mg/dL

## 2017-03-10 ENCOUNTER — Ambulatory Visit (INDEPENDENT_AMBULATORY_CARE_PROVIDER_SITE_OTHER): Payer: PPO | Admitting: Physician Assistant

## 2017-03-10 ENCOUNTER — Encounter: Payer: Self-pay | Admitting: Physician Assistant

## 2017-03-10 VITALS — BP 128/64 | HR 52 | Ht 65.0 in | Wt 183.0 lb

## 2017-03-10 DIAGNOSIS — I10 Essential (primary) hypertension: Secondary | ICD-10-CM | POA: Diagnosis not present

## 2017-03-10 DIAGNOSIS — I25810 Atherosclerosis of coronary artery bypass graft(s) without angina pectoris: Secondary | ICD-10-CM

## 2017-03-10 DIAGNOSIS — I4821 Permanent atrial fibrillation: Secondary | ICD-10-CM

## 2017-03-10 DIAGNOSIS — Z7901 Long term (current) use of anticoagulants: Secondary | ICD-10-CM | POA: Diagnosis not present

## 2017-03-10 DIAGNOSIS — I482 Chronic atrial fibrillation: Secondary | ICD-10-CM | POA: Diagnosis not present

## 2017-03-10 NOTE — Patient Instructions (Signed)
Medication Instructions:  Your physician recommends that you continue on your current medications as directed. Please refer to the Current Medication list given to you today.   Labwork: None ordered  Testing/Procedures: None ordered  Follow-Up: Your physician wants you to follow-up in: [place] DR. CRENSHAW  You will receive a reminder letter in the mail two months in advance. If you don't receive a letter, please call our office to schedule the follow-up appointment.   Any Other Special Instructions Will Be Listed Below (If Applicable).  1.  CALL OUR OFFICE IF YOU HAVE INCREASED LOWER EXTREMITY EDEMA (SWELLING) 2.  Your physician recommends that you weigh, daily, at the same time every day, and in the same amount of clothing. Please record your daily weights on the handout provided and bring it to your next appointment. 3.  CALL OUR OFFICE IF YOU HAVE INCREASED DIZZINESS 4.  CONTIUE TO CHECK YOUR BLOOD PRESSURE PERIODICALLY (1-2 X'S A WEEK) CALL us IF YOU NOTICE THE TOP # IS CONTINUOUSLY RUNNING 160 OR HIGHER       If you need a refill on your cardiac medications before your next appointment, please call your pharmacy.

## 2017-03-10 NOTE — Progress Notes (Signed)
Cardiology Office Note   Date:  03/10/2017   ID:  Eric [name]., DOB [birth year], MRN [id number]  PCP:  [name], MD  Cardiologist:  Dr. Stanford Breed, 09/14/2016  Eric Ferries, PA-C    History of Present Illness: Eric [name]. is a 83 y.o. male with a history of CABG 2000 w/ LIMA-LAD, SVG-Diag, SVG-PDA, Cath 2003 w/ SVG-Diag & SVG-PDA 100% & PTCA LAD/Diag, perm afib, aflutter w/ ablation, CKD III, DM, PAD w/ PTCA R lower extrem, MV 2015 w/ sm amt ischemia, med rx, on Eliquis w/ CHADS2VASC= (DM, CAD, age x 2)  Eric [name]. presents for cardiology follow up.   He is not having any bleeding problems with the Eliquis.   His BP is still running high in the afternoons. It will get up to 180s/80s. His BP was up this am as well.  He is not having SOB, minimal daytime LE edema. His Cr is elevated, but has been stable at 1.5-1.6.   He never gets chest pain, never did. His only anginal symptoms was SOB. He exerts himself regularly bringing in wood for heat, he works on [employer]. He has not been getting the SOB he was getting prior to his CABG.    His feet have been numb for years, no recent change. No cramping pain with ambulation. He uses a cane when he walks long distance.  He will get a little light-headed when he bends over and stays. Otherwise, no dizziness or presyncope.  He is never aware of his HR, never feels palpitations or his heart skipping/racing     Past Medical History:  Diagnosis Date  . Atrial fibrillation ([place])   . Atrial flutter ([place])    s/p RFCA  . CAD (coronary artery disease)    cath 2003, occluded S-RCA, occluded S-Dx, L-LAD ok, s/p PTCA to LAD  . Cataract   . CKD (chronic kidney disease) stage 3, GFR 30-59 ml/min   . Diabetes mellitus   . Long term (current) use of anticoagulants   . Neuromuscular disorder ([place])   . OSA (obstructive sleep apnea) 12/11   very mild, AHI 7/hr  . Persistent atrial fibrillation ([place]) 09/26/2015   . Pure hyperglyceridemia   . PVD (peripheral vascular disease) ([place])    angioplasty of his right lower extremity in [place] by Dr.[name] 2013  . Unspecified essential hypertension     Past Surgical History:  Procedure Laterality Date  . Adenosine Myoview  3/06   EF 56%, neg. Ischemia  . Adenosine Myoview  02/18/07   nml  . ANGIOPLASTY  1/99   CAD- diogonal with rotational artherectomy  . Arthrectomy     of LAD & PTCA  . CARDIAC CATHETERIZATION  1/00  . CARDIOVERSION  1/04  . CARDIOVERSION  5/07   hospital- a flutter  . CARPAL TUNNEL RELEASE     ? bilateral  . COLONOSCOPY W/ BIOPSIES  10/01/06   sigmoid polyp bx neg, 3 years  . CORONARY ANGIOPLASTY  4/03   cutting balloon PTCA pLAD into Diag  . CORONARY ARTERY BYPASS GRAFT  2000   LIMA-LAD, SVG-RCA, SVG-Diag; SVG-Diag & SVG-RCA occluded 2003  . FRACTURE SURGERY    . HAND SURGERY  08/27/09   R thumb procedure wit Scaphoid Gragt and screws, Dr [name]  . NM MYOVIEW LTD  4/11   normal  . PERIPHERAL VASCULAR CATHETERIZATION Right 06/27/2015   Procedure: Lower Extremity Angiography;  Surgeon: Algernon [name], MD;  Location:  [facility];  Service: Cardiovascular;  Laterality: Right;  . PERIPHERAL VASCULAR CATHETERIZATION  06/27/2015   Procedure: Lower Extremity Intervention;  Surgeon: Algernon [name], MD;  Location: [facility];  Service: Cardiovascular;;    Current Outpatient Prescriptions  Medication Sig Dispense Refill  . acetaminophen (TYLENOL) 500 MG tablet Take 1,000 mg by mouth every 8 (eight) hours as needed for mild pain.     [name] apixaban (ELIQUIS) 2.5 MG TABS tablet Take 1 tablet (2.5 mg total) by mouth 2 (two) times daily. 60 tablet 0  . B Complex Vitamins (B COMPLEX-B12) TABS Take 1 tablet by mouth daily.     . Blood Glucose Monitoring Suppl (ONE TOUCH ULTRA SYSTEM KIT) W/DEVICE KIT 1 kit by Does not apply route once. 1 each 0  . cholecalciferol (VITAMIN D) 1000 UNITS tablet Take 1,000 Units by mouth 2 (two)  times a week. Takes on Mon and Fri    . CINNAMON PO Take by mouth 2 (two) times daily.     [name] ELIQUIS 2.5 MG TABS tablet Take 1 tablet by mouth twice a day 180 tablet 0  . fenofibrate 160 MG tablet Take 1 tablet (160 mg total) by mouth daily. 90 tablet 3  . fish oil-omega-3 fatty acids 1000 MG capsule Take 3 g by mouth daily.     . furosemide (LASIX) 20 MG tablet Take 1 tablet (20 mg total) by mouth daily. 90 tablet 3  . glucose blood test strip Use as instructed 100 each 3  . lisinopril (PRINIVIL,ZESTRIL) 40 MG tablet Take 1 tablet (40 mg total) by mouth daily. 90 tablet 1  . metoprolol tartrate (LOPRESSOR) 50 MG tablet Take 1 tablet (50 mg total) by mouth 2 (two) times daily. 180 tablet 3  . Multiple Vitamin (DAILY MULTIVITAMIN PO) Take 1 tablet by mouth daily.      [name] DELICA LANCETS FINE MISC 1 each by Does not apply route daily. 100 each 3  . rosuvastatin (CRESTOR) 40 MG tablet Take 1 tablet (40 mg total) by mouth daily. 90 tablet 3  . amLODipine (NORVASC) 10 MG tablet Take 1 tablet (10 mg total) by mouth at bedtime. 90 tablet 3   No current facility-administered medications for this visit.     Allergies:   Isosorbide mononitrate; Quinidine gluconate; and Ramipril    Social History:  The patient  reports that he has never smoked. He has quit using smokeless tobacco. He reports that he does not drink alcohol or use drugs.   Family History:  The patient's family history includes Aneurysm in his father; Breast cancer in his sister; Hypertension in his father and mother; Liver cancer in his brother; Lung cancer in his brother and brother; Other in his brother and sister; Prostate cancer in his brother; Stroke in his father; Thrombosis in his brother.    ROS:  Please see the history of present illness. All other systems are reviewed and negative.    PHYSICAL EXAM: VS:  BP 128/64   Pulse (!) 52   Ht '5\' 5"'  (1.651 m)   Wt 183 lb (83 kg)   BMI 30.45 kg/m  , BMI Body mass index is  30.45 kg/m. GEN: Well nourished, well developed, male in no acute distress  HEENT: normal for age  Neck: no JVD, no carotid bruit, no masses Cardiac: Irreg R&R; soft murmur, no rubs, or gallops Respiratory:  clear to auscultation bilaterally, normal work of breathing GI: soft, nontender, nondistended, + BS MS:  no deformity or atrophy; 1+ edema; distal pulses are 1-2+ in all 4 extremities   Skin: warm and dry, no rash; mild erythema and multiple healing minor scratches and abrasions are noted. Neuro:  Strength and sensation are intact Psych: euthymic mood, full affect   EKG:  EKG is not ordered today.   Recent Labs: 03/01/2017: ALT 20; BUN 24; Creat 1.63; Hemoglobin 11.9; Platelets 196; Potassium 5.2; Sodium 138    Lipid Panel    Component Value Date/Time   CHOL 100 03/01/2017 0828   TRIG 193 (H) 03/01/2017 0828   HDL 6 (L) 03/01/2017 0828   CHOLHDL 16.7 (H) 03/01/2017 0828   VLDL 39 (H) 03/01/2017 0828   LDLCALC 55 03/01/2017 0828   LDLDIRECT 99.1 02/19/2011 0823     Wt Readings from Last 3 Encounters:  03/10/17 183 lb (83 kg)  03/01/17 182 lb (82.6 kg)  12/22/16 187 lb (84.8 kg)     Other studies Reviewed: Additional studies/ records that were reviewed today include: office notes, hospital records and testing.  ASSESSMENT AND PLAN:  1.  CAD: No ongoing ischemic sx. He and his wife are to watch for the Shortness of breath that he had prior to his bypass surgery. He has not been having. He is encouraged to maintain a good level of activity. He is not on aspirin because of the Eliquis. He is on an ACE inhibitor and metoprolol as well as omega-3 fatty acids, fenofibrate, and high-dose Crestor.  2. Hypertension: I explained to the patient and his wife that he is on the maximum dose of amlodipine, the maximum dose of lisinopril, and I could not increase the metoprolol because of resting bradycardia. I advised that I could get him hydralazine to take in the afternoon as needed  for high blood pressure but that might make his orthostatic dizziness worse. He says he will hold off for now and is long as his blood pressure is only up for a little while in the afternoons, he feels this will be okay. He is to let us know if he gets any worse or if his blood pressure is up all the time.  3. Permanent atrial fibrillation: His heart rate is in the low 50s today at rest. On exam, there were no significant pauses. The only time he gets disease when he bends over. He is instructed to contact us at the dizziness gets worse. He does not feel like he is in danger of falling.  Discuss with Dr. Stanford Breed if we should decrease the metoprolol.  4. Chronic anticoagulation: He is not having problems with the Eliquis and there are no signs of anemia.  5. Chronic kidney disease stage III: He is followed closely by Dr. Debroah Baller. His recent labs showed a BUN of 24 and creatinine 1.63. This is slightly improved from January labs and not significantly different from January 2017 labs.   Current medicines are reviewed at length with the patient today.  The patient does not have concerns regarding medicines.  The following changes have been made:  no change  Labs/ tests ordered today include:  No orders of the defined types were placed in this encounter.    Disposition:   FU with Dr. Stanford Breed  Signed, Eric Ferries, PA-C  03/10/2017 8:06 AM    [place] Phone: [phone]; Fax: [phone]  This note was written with the assistance of speech recognition software. Please excuse any transcriptional errors.

## 2017-03-12 ENCOUNTER — Telehealth: Payer: Self-pay | Admitting: Podiatry

## 2017-03-12 NOTE — Telephone Encounter (Signed)
Left message for pt to call to schedule an appt for diabetic shoe measurements °

## 2017-03-17 ENCOUNTER — Other Ambulatory Visit: Payer: PPO

## 2017-04-06 ENCOUNTER — Ambulatory Visit (INDEPENDENT_AMBULATORY_CARE_PROVIDER_SITE_OTHER): Payer: PPO | Admitting: Orthotics

## 2017-04-06 DIAGNOSIS — M205X1 Other deformities of toe(s) (acquired), right foot: Secondary | ICD-10-CM

## 2017-04-06 DIAGNOSIS — L84 Corns and callosities: Secondary | ICD-10-CM

## 2017-04-06 DIAGNOSIS — E1151 Type 2 diabetes mellitus with diabetic peripheral angiopathy without gangrene: Secondary | ICD-10-CM | POA: Diagnosis not present

## 2017-04-06 DIAGNOSIS — M2021 Hallux rigidus, right foot: Secondary | ICD-10-CM

## 2017-04-06 NOTE — Progress Notes (Signed)

## 2017-04-14 ENCOUNTER — Other Ambulatory Visit: Payer: Self-pay | Admitting: Cardiology

## 2017-04-20 ENCOUNTER — Other Ambulatory Visit: Payer: Self-pay | Admitting: Family Medicine

## 2017-04-23 ENCOUNTER — Encounter: Payer: Self-pay | Admitting: Family Medicine

## 2017-04-23 MED ORDER — FENOFIBRATE 160 MG PO TABS: 160.0000 mg | ORAL_TABLET | Freq: Every day | ORAL | 2 refills | Status: DC

## 2017-04-27 DIAGNOSIS — E119 Type 2 diabetes mellitus without complications: Secondary | ICD-10-CM | POA: Diagnosis not present

## 2017-04-27 LAB — HM DIABETES EYE EXAM

## 2017-05-03 ENCOUNTER — Ambulatory Visit (INDEPENDENT_AMBULATORY_CARE_PROVIDER_SITE_OTHER): Payer: PPO | Admitting: Family Medicine

## 2017-05-03 ENCOUNTER — Encounter: Payer: Self-pay | Admitting: Family Medicine

## 2017-05-03 VITALS — BP 142/72 | HR 54 | Temp 97.8°F | Resp 16 | Ht 65.0 in | Wt 178.0 lb

## 2017-05-03 DIAGNOSIS — Z0000 Encounter for general adult medical examination without abnormal findings: Secondary | ICD-10-CM

## 2017-05-03 DIAGNOSIS — Z23 Encounter for immunization: Secondary | ICD-10-CM | POA: Diagnosis not present

## 2017-05-03 NOTE — Addendum Note (Signed)
Addended by: Shary Decamp B on: 05/03/2017 01:00 PM   Modules accepted: Orders

## 2017-05-03 NOTE — Progress Notes (Signed)
Subjective:    Patient ID: Eric [name]., male    DOB: 02-02-1928, 83 y.o.   MRN: [id number]  Medication Refill   Please see the last office visit. At the patient's last visit for.I found an ulcerative callus on the tip of his third left toe. It is approximately 1 cm in diameter. I debrided down to healthy tissue removing the hyperkeratotic tissue. He is here today for follow-up. On the tip of the left third toe is a patch approximately 1.3 cm in diameter of thick hyperkeratotic tissue. However the central ulcer appears almost completely healed. It is now 2 mm in size.  AT that time, my plan was: I debrided the hyperkeratotic tissue from around the ulcer. Ulcers possibly 2 mm in size and there is no evidence of cellulitis. Is extremely superficial. Recheck in 2 weeks and repeat process until healed  09/11/16 Here today for recheck. The ulcer has completely healed and with healthy tissue. There is still a halo of thick hyperkeratotic pre-ulcerative callus which I removed with a razor blade down to healthy tissue. There is no evidence of cellulitis. Thankfully there is no longer any skin breakdown.  At that time, my plan was: Now healed. We discussed modifying his orthotic to prevent this from recurring. I explained to the patient that this is a pressure sore. Therefore I recommended taking his orthotic out and removing material where the toe is rubbing to create a pocket/pad for this toe.  03/01/17 Here for follow up.  Patient's blood pressure today is well controlled. He denies any chest pain shortness of breath or dyspnea on exertion. He denies any angina. He denies any palpitations. He denies any syncope. He saw his podiatrist recently who made some changes in his orthotics. Diabetic foot exam is performed today and is significant for a pre-ulcerative callus on the plantar aspects of the fifth MTP joint bilaterally as well as on the tip of the left third toe similar to where he had the ulcer  previously. He denies any polyuria polydipsia or blurry vision. He denies any myalgias or right upper quadrant pain. He is compliant taking his anticoagulant. He denies any tachycardia or lightheadedness.  At that time, my plan was:  Blood pressure today is well controlled. I will make no changes in his antihypertensives. I will check a fasting lipid panel. Goal LDL cholesterol is less than 70. I will also check his hemoglobin A1c. I'll hemoglobin A1c is less than 6.5. Immunizations are up-to-date. Pre-ulcerative calluses have been addressed by his podiatrist by altering his orthotic. I will check a urine microalbumin. I will also monitor his renal function with a BMP.  05/03/17 Patient is here today for his complete physical exam. He denies any concerns. He denies any falls in the last year. He denies any symptoms of depression. Lab work from July was outstanding as evidenced below: Office Visit on 03/01/2017  Component Date Value Ref Range Status  . WBC 03/01/2017 9.1  3.8 - 10.8 K/uL Final  . RBC 03/01/2017 3.87* 4.20 - 5.80 MIL/uL Final  . Hemoglobin 03/01/2017 11.9* 13.0 - 17.0 g/dL Final  . HCT 03/01/2017 37.7* 38.5 - 50.0 % Final  . MCV 03/01/2017 97.4  80.0 - 100.0 fL Final  . MCH 03/01/2017 30.7  27.0 - 33.0 pg Final  . MCHC 03/01/2017 31.6* 32.0 - 36.0 g/dL Final  . RDW 03/01/2017 16.1* 11.0 - 15.0 % Final  . Platelets 03/01/2017 196  140 - 400 K/uL Final  .  MPV 03/01/2017 10.6  7.5 - 12.5 fL Final  . Neutro Abs 03/01/2017 5096  1,500 - 7,800 cells/uL Final  . Lymphs Abs 03/01/2017 3003  850 - 3,900 cells/uL Final  . Monocytes Absolute 03/01/2017 637  200 - 950 cells/uL Final  . Eosinophils Absolute 03/01/2017 273  15 - 500 cells/uL Final  . Basophils Absolute 03/01/2017 91  0 - 200 cells/uL Final  . Neutrophils Relative % 03/01/2017 56  % Final  . Lymphocytes Relative 03/01/2017 33  % Final  . Monocytes Relative 03/01/2017 7  % Final  . Eosinophils Relative 03/01/2017 3  % Final    . Basophils Relative 03/01/2017 1  % Final  . Smear Review 03/01/2017 Criteria for review not met   Final  . Sodium 03/01/2017 138  135 - 146 mmol/L Final  . Potassium 03/01/2017 5.2  3.5 - 5.3 mmol/L Final  . Chloride 03/01/2017 105  98 - 110 mmol/L Final  . CO2 03/01/2017 25  20 - 31 mmol/L Final  . Glucose, Bld 03/01/2017 111* 70 - 99 mg/dL Final  . BUN 03/01/2017 24  7 - 25 mg/dL Final  . Creat 03/01/2017 1.63* 0.70 - 1.11 mg/dL Final   Comment:   For patients > or = 81 years of age: The upper reference limit for Creatinine is approximately 13% higher for people identified as African-American.     . Total Bilirubin 03/01/2017 0.7  0.2 - 1.2 mg/dL Final  . Alkaline Phosphatase 03/01/2017 28* 40 - 115 U/L Final  . AST 03/01/2017 35  10 - 35 U/L Final  . ALT 03/01/2017 20  9 - 46 U/L Final  . Total Protein 03/01/2017 7.1  6.1 - 8.1 g/dL Final  . Albumin 03/01/2017 4.2  3.6 - 5.1 g/dL Final  . Calcium 03/01/2017 9.7  8.6 - 10.3 mg/dL Final  . GFR, Est African American 03/01/2017 43* >=60 mL/min Final  . GFR, Est Non African American 03/01/2017 38* >=60 mL/min Final  . Cholesterol 03/01/2017 100  <200 mg/dL Final  . Triglycerides 03/01/2017 193* <150 mg/dL Final  . HDL 03/01/2017 6* >40 mg/dL Final  . Total CHOL/HDL Ratio 03/01/2017 16.7* <5.0 Ratio Final  . VLDL 03/01/2017 39* <30 mg/dL Final  . LDL Cholesterol 03/01/2017 55  <100 mg/dL Final  . Microalb, Ur 03/01/2017 2.4  Not estab mg/dL Final   Comment: The ADA has defined abnormalities in albumin excretion as follows:           Category           Result                            (mcg/mg creatinine)                 Normal:    <30       Microalbuminuria:    30 - 299   Clinical albuminuria:    > or = 300   The ADA recommends that at least two of three specimens collected within a 3 - 6 month period be abnormal before considering a patient to be within a diagnostic category.     . Hgb A1c MFr Bld 03/01/2017 5.9* <5.7 %  Final   Comment:   For someone without known diabetes, a hemoglobin A1c value between 5.7% and 6.4% is consistent with prediabetes and should be confirmed with a follow-up test.   For someone with known diabetes, a value <7%  indicates that their diabetes is well controlled. A1c targets should be individualized based on duration of diabetes, age, co-morbid conditions and other considerations.   This assay result is consistent with an increased risk of diabetes.   Currently, no consensus exists regarding use of hemoglobin A1c for diagnosis of diabetes in children.     . Mean Plasma Glucose 03/01/2017 123  mg/dL Final   Hemoglobin A1c was excellent and LDL cholesterol was well below goal. Last significant only for mild anemia likely related to chronic disease/chronic kidney disease/chronic anticoagulation. Due to his advanced age, he no long requires prostate cancer screening or colonoscopies. His immunizations are listed below: Immunization History  Administered Date(s) Administered  . Influenza Split 07/14/2011, 06/02/2012  . Influenza Whole 05/24/2005, 05/20/2007, 05/21/2008, 07/19/2009, 05/28/2010  . Influenza,inj,Quad PF,6+ Mos 04/11/2013, 04/20/2014, 05/06/2015, 06/02/2016  . Pneumococcal Conjugate-13 05/06/2015  . Pneumococcal Polysaccharide-23 10/17/2013  . Td 03/13/2004   Patient is due for a flu shot today. He is a good candidate for high-dose flu shot. Diabetic foot exam was performed today and does show pre-ulcerative calluses on the plantar surfaces of both fifth MTP joints. He has diminished sensation bilaterally to 10 g monofilament in that he cannot feel it at all. He has adequate circulation. Patient sees his podiatrist 3 times year and has custom fitting diabetic shoes to prevent this from worsening. Past Medical History:  Diagnosis Date  . Atrial fibrillation ([place])   . Atrial flutter ([place])    s/p RFCA  . CAD (coronary artery disease)    cath 2003, occluded S-RCA,  occluded S-Dx, L-LAD ok, s/p PTCA to LAD  . Cataract   . CKD (chronic kidney disease) stage 3, GFR 30-59 ml/min   . Diabetes mellitus   . Long term (current) use of anticoagulants   . Neuromuscular disorder ([place])   . OSA (obstructive sleep apnea) 12/11   very mild, AHI 7/hr  . Persistent atrial fibrillation ([place]) 09/26/2015  . Pure hyperglyceridemia   . PVD (peripheral vascular disease) ([place])    angioplasty of his right lower extremity in [place] by Dr.[name] 2013  . Unspecified essential hypertension    Past Surgical History:  Procedure Laterality Date  . Adenosine Myoview  3/06   EF 56%, neg. Ischemia  . Adenosine Myoview  02/18/07   nml  . ANGIOPLASTY  1/99   CAD- diogonal with rotational artherectomy  . Arthrectomy     of LAD & PTCA  . CARDIAC CATHETERIZATION  1/00  . CARDIOVERSION  1/04  . CARDIOVERSION  5/07   hospital- a flutter  . CARPAL TUNNEL RELEASE     ? bilateral  . COLONOSCOPY W/ BIOPSIES  10/01/06   sigmoid polyp bx neg, 3 years  . CORONARY ANGIOPLASTY  4/03   cutting balloon PTCA pLAD into Diag  . CORONARY ARTERY BYPASS GRAFT  2000   LIMA-LAD, SVG-RCA, SVG-Diag; SVG-Diag & SVG-RCA occluded 2003  . FRACTURE SURGERY    . HAND SURGERY  08/27/09   R thumb procedure wit Scaphoid Gragt and screws, Dr [name]  . NM MYOVIEW LTD  4/11   normal  . PERIPHERAL VASCULAR CATHETERIZATION Right 06/27/2015   Procedure: Lower Extremity Angiography;  Surgeon: Algernon [name], MD;  Location: [facility];  Service: Cardiovascular;  Laterality: Right;  . PERIPHERAL VASCULAR CATHETERIZATION  06/27/2015   Procedure: Lower Extremity Intervention;  Surgeon: Algernon [name], MD;  Location: [facility];  Service: Cardiovascular;;   Current Outpatient Prescriptions on File Prior to  Visit  Medication Sig Dispense Refill  . acetaminophen (TYLENOL) 500 MG tablet Take 1,000 mg by mouth every 8 (eight) hours as needed for mild pain.     [name] amLODipine (NORVASC) 10 MG tablet Take 1  tablet (10 mg total) by mouth at bedtime. 90 tablet 3  . apixaban (ELIQUIS) 2.5 MG TABS tablet Take 1 tablet (2.5 mg total) by mouth 2 (two) times daily. 60 tablet 0  . B Complex Vitamins (B COMPLEX-B12) TABS Take 1 tablet by mouth daily.     . Blood Glucose Monitoring Suppl (ONE TOUCH ULTRA SYSTEM KIT) W/DEVICE KIT 1 kit by Does not apply route once. 1 each 0  . cholecalciferol (VITAMIN D) 1000 UNITS tablet Take 1,000 Units by mouth 2 (two) times a week. Takes on Mon and Fri    . CINNAMON PO Take by mouth 2 (two) times daily.     [name] ELIQUIS 2.5 MG TABS tablet Take 1 tablet by mouth twice a day 180 tablet 1  . fenofibrate 160 MG tablet Take 1 tablet (160 mg total) by mouth daily. 90 tablet 2  . fish oil-omega-3 fatty acids 1000 MG capsule Take 3 g by mouth daily.     . furosemide (LASIX) 20 MG tablet Take 1 tablet (20 mg total) by mouth daily. 90 tablet 3  . glucose blood test strip Use as instructed 100 each 3  . lisinopril (PRINIVIL,ZESTRIL) 40 MG tablet Take 1 tablet (40 mg total) by mouth daily. 90 tablet 1  . metoprolol tartrate (LOPRESSOR) 50 MG tablet Take 1 tablet (50 mg total) by mouth 2 (two) times daily. 180 tablet 3  . Multiple Vitamin (DAILY MULTIVITAMIN PO) Take 1 tablet by mouth daily.      [name] DELICA LANCETS FINE MISC 1 each by Does not apply route daily. 100 each 3  . rosuvastatin (CRESTOR) 40 MG tablet Take 1 tablet (40 mg total) by mouth daily. 90 tablet 3   No current facility-administered medications on file prior to visit.    Allergies  Allergen Reactions  . Isosorbide Mononitrate Other (See Comments)    Unknown   . Quinidine Gluconate Other (See Comments)    Unknown  . Ramipril Other (See Comments)    Unknown   Social History   Social History  . Marital status: Married    Spouse name: N/A  . Number of children: 5  . Years of education: N/A   Occupational History  . [employer] Retired   Social History Main Topics  . Smoking status: Never  Smoker  . Smokeless tobacco: Former Systems developer  . Alcohol use No  . Drug use: No  . Sexual activity: No   Other Topics Concern  . Not on file   Social History Narrative   Retired now does some antiques   Retired from Theatre manager and dye work   Married 1951   5 kids    Family History  Problem Relation Age of Onset  . Stroke Father   . Aneurysm Father   . Hypertension Father   . Prostate cancer Brother   . Hypertension Mother   . Lung cancer Brother        smoker  . Thrombosis Brother   . Other Brother        RF valve disorder (smoker)  . Lung cancer Brother        smoker  . Breast cancer Sister   . Liver cancer Brother   . Other Sister  cerebral hemorrhage     Review of Systems  All other systems reviewed and are negative.      Objective:   Physical Exam  Constitutional: He is oriented to person, place, and time. He appears well-developed and well-nourished. No distress.  HENT:  Head: Normocephalic and atraumatic.  Right Ear: External ear normal.  Left Ear: External ear normal.  Nose: Nose normal.  Mouth/Throat: Oropharynx is clear and moist. No oropharyngeal exudate.  Eyes: Pupils are equal, round, and reactive to light. Conjunctivae and EOM are normal. Right eye exhibits no discharge. Left eye exhibits no discharge. No scleral icterus.  Neck: Neck supple. No JVD present. No tracheal deviation present. No thyromegaly present.  Cardiovascular: Normal rate and normal heart sounds.  An irregularly irregular rhythm present.  No murmur heard. Pulmonary/Chest: Breath sounds normal. No stridor. No respiratory distress. He has no wheezes. He has no rales. He exhibits no tenderness.  Abdominal: Soft. Bowel sounds are normal. He exhibits no distension and no mass. There is no tenderness. There is no rebound and no guarding.  Musculoskeletal: He exhibits no edema, tenderness or deformity.  Lymphadenopathy:    He has no cervical adenopathy.  Neurological: He is alert and  oriented to person, place, and time. He has normal reflexes. He displays normal reflexes. No cranial nerve deficit. He exhibits normal muscle tone. Coordination normal.  Skin: No rash noted. He is not diaphoretic. No erythema. No pallor.  Psychiatric: He has a normal mood and affect. His behavior is normal. Judgment and thought content normal.  Vitals reviewed.         Assessment & Plan:  General medical exam physical exam today is completely normal aside from his neuropathy in his feet in the pre-ulcerative calluses over the plantar surfaces of the fifth MTP joints bilaterally. His blood pressure today is acceptable. His immunizations are now up-to-date. He is not due for any cancer screening. Fall and depression screenings are normal. Diabetic eye exam is up-to-date. I reviewed his lab work from July which was outstanding. I recommended repeating this again in December or January. No changes at the present time

## 2017-05-07 ENCOUNTER — Other Ambulatory Visit: Payer: Self-pay | Admitting: Family Medicine

## 2017-05-10 NOTE — Telephone Encounter (Signed)
Refill appropriate 

## 2017-05-25 ENCOUNTER — Encounter: Payer: Self-pay | Admitting: Family Medicine

## 2017-05-25 ENCOUNTER — Telehealth: Payer: Self-pay | Admitting: Family Medicine

## 2017-05-25 NOTE — Telephone Encounter (Signed)
Wife called requesting a refill on the test strips for patient. She states he has a one touch meter and she would like these called into [facility].   CB# [phone]

## 2017-05-26 MED ORDER — GLUCOSE BLOOD VI STRP: ORAL_STRIP | 3 refills | Status: DC

## 2017-05-26 NOTE — Telephone Encounter (Signed)
Test strip refills sent to the [facility] as requested.  Pt made aware

## 2017-06-02 ENCOUNTER — Ambulatory Visit (INDEPENDENT_AMBULATORY_CARE_PROVIDER_SITE_OTHER): Payer: PPO | Admitting: Podiatry

## 2017-06-02 ENCOUNTER — Encounter: Payer: Self-pay | Admitting: Podiatry

## 2017-06-02 DIAGNOSIS — B351 Tinea unguium: Secondary | ICD-10-CM | POA: Diagnosis not present

## 2017-06-02 DIAGNOSIS — H2513 Age-related nuclear cataract, bilateral: Secondary | ICD-10-CM | POA: Diagnosis not present

## 2017-06-02 DIAGNOSIS — E1151 Type 2 diabetes mellitus with diabetic peripheral angiopathy without gangrene: Secondary | ICD-10-CM

## 2017-06-02 DIAGNOSIS — Q828 Other specified congenital malformations of skin: Secondary | ICD-10-CM

## 2017-06-02 DIAGNOSIS — M79676 Pain in unspecified toe(s): Secondary | ICD-10-CM

## 2017-06-02 DIAGNOSIS — E1142 Type 2 diabetes mellitus with diabetic polyneuropathy: Secondary | ICD-10-CM | POA: Diagnosis not present

## 2017-06-02 NOTE — Progress Notes (Signed)
Patient ID: Eric [name]., male   DOB: 10-Jul-1928, 83 y.o.   MRN: [id number] Complaint:  Visit Type: Patient returns to my office for continued preventative foot care services. Complaint: Patient states" my nails have grown long and thick and become painful to walk and wear shoes" Patient has been diagnosed with DM with neuropathy.. The patient presents for preventative foot care services. No changes to ROS.  He has painful callus under the outside ball of both feet.    Podiatric Exam: Vascular: dorsalis pedis and posterior tibial pulses are mildly palpable   bilateral. Capillary return is immediate. Temperature gradient is WNL. Skin turgor WNL  Sensorium: Absent  Semmes Weinstein monofilament test. Normal tactile sensation bilaterally. Nail Exam: Pt has thick disfigured discolored nails with subungual debris noted bilateral entire nail hallux through fifth toenails  Orthopedic Exam: Muscle tone and strength are WNL. No limitations in general ROM. No crepitus or effusions noted. Foot type and digits show no abnormalities.  Skin:   There is porokeratotic lesion sub 5th met B/L.   Diagnosis:  Onychomycosis, , Pain in right toe, pain in left toes,  Porokeratosis sub 5th met B/L.  Treatment & Plan Procedures and Treatment: Consent by patient was obtained for treatment procedures. The patient understood the discussion of treatment and procedures well. All questions were answered thoroughly reviewed. Debridement of mycotic and hypertrophic toenails, 1 through 5 bilateral and clearing of subungual debris. No ulceration, no infection noted.  Debridement of porokeratosis  .   Return Visit-Office Procedure: Patient instructed to return to the office for a follow up visit 10 weeks. for continued evaluation and treatment.     Gardiner Barefoot DPM

## 2017-06-30 ENCOUNTER — Encounter: Payer: Self-pay | Admitting: Family Medicine

## 2017-06-30 ENCOUNTER — Other Ambulatory Visit: Payer: Self-pay

## 2017-06-30 ENCOUNTER — Ambulatory Visit: Payer: PPO | Admitting: Family Medicine

## 2017-06-30 VITALS — BP 130/60 | HR 52 | Temp 98.4°F | Resp 12 | Ht 65.0 in | Wt 184.0 lb

## 2017-06-30 DIAGNOSIS — M7989 Other specified soft tissue disorders: Secondary | ICD-10-CM

## 2017-06-30 LAB — BASIC METABOLIC PANEL WITH GFR
BUN/Creatinine Ratio: 16 (calc) (ref 6–22)
BUN: 26 mg/dL — AB (ref 7–25)
CALCIUM: 9.8 mg/dL (ref 8.6–10.3)
CO2: 26 mmol/L (ref 20–32)
CREATININE: 1.59 mg/dL — AB (ref 0.70–1.11)
Chloride: 103 mmol/L (ref 98–110)
GFR, EST NON AFRICAN AMERICAN: 38 mL/min/{1.73_m2} — AB (ref >=60)
GFR, Est African American: 45 mL/min/{1.73_m2} — ABNORMAL LOW (ref >=60)
Glucose, Bld: 81 mg/dL (ref 65–99)
Potassium: 5.2 mmol/L (ref 3.5–5.3)
SODIUM: 137 mmol/L (ref 135–146)

## 2017-06-30 MED ORDER — FUROSEMIDE 40 MG PO TABS: 40.0000 mg | ORAL_TABLET | Freq: Every day | ORAL | 3 refills | Status: DC

## 2017-06-30 MED ORDER — MOMETASONE FUROATE 0.1 % EX CREA: 1.0000 "application " | TOPICAL_CREAM | Freq: Every day | CUTANEOUS | 0 refills | Status: DC

## 2017-06-30 NOTE — Progress Notes (Signed)
Subjective:    Patient ID: Eric Hough., male    DOB: 01/15/1928, 83 y.o.   MRN: [id number]  HPI  Patient is here today with a week of leg swelling.  His weight is up 6 pounds since his last office visit.  He has +1 pitting edema in both legs to the knees.  There is an erythematous papular rash on the anterior surfaces of both shins that has only occurred since the leg swelling has begun.  He denies any change in his diet.  He denies any more salt.  He has persistent atrial fibrillation but he is not in RVR today.  His lungs are clear to auscultation without evidence of pulmonary edema.  He denies any chest pain.  He denies any shortness of breath or dyspnea on exertion.  He denies any orthopnea. Past Medical History:  Diagnosis Date  . Atrial fibrillation ([place])   . Atrial flutter ([place])    s/p RFCA  . CAD (coronary artery disease)    cath 2003, occluded S-RCA, occluded S-Dx, L-LAD ok, s/p PTCA to LAD  . Cataract   . CKD (chronic kidney disease) stage 3, GFR 30-59 ml/min ([facility])   . Diabetes mellitus   . Long term (current) use of anticoagulants   . Neuromuscular disorder ([place])   . OSA (obstructive sleep apnea) 12/11   very mild, AHI 7/hr  . Persistent atrial fibrillation ([place]) 09/26/2015  . Pure hyperglyceridemia   . PVD (peripheral vascular disease) ([place])    angioplasty of his right lower extremity in [place] by Dr.[name] 2013  . Unspecified essential hypertension    Past Surgical History:  Procedure Laterality Date  . Adenosine Myoview  3/06   EF 56%, neg. Ischemia  . Adenosine Myoview  02/18/07   nml  . ANGIOPLASTY  1/99   CAD- diogonal with rotational artherectomy  . Arthrectomy     of LAD & PTCA  . CARDIAC CATHETERIZATION  1/00  . CARDIOVERSION  1/04  . CARDIOVERSION  5/07   hospital- a flutter  . CARPAL TUNNEL RELEASE     ? bilateral  . COLONOSCOPY W/ BIOPSIES  10/01/06   sigmoid polyp bx neg, 3 years  . CORONARY ANGIOPLASTY  4/03   cutting balloon PTCA pLAD  into Diag  . CORONARY ARTERY BYPASS GRAFT  2000   LIMA-LAD, SVG-RCA, SVG-Diag; SVG-Diag & SVG-RCA occluded 2003  . FRACTURE SURGERY    . HAND SURGERY  08/27/09   R thumb procedure wit Scaphoid Gragt and screws, Dr [name]  . NM MYOVIEW LTD  4/11   normal  . PERIPHERAL VASCULAR CATHETERIZATION Right 06/27/2015   Procedure: Lower Extremity Angiography;  Surgeon: Algernon [name], MD;  Location: [facility];  Service: Cardiovascular;  Laterality: Right;  . PERIPHERAL VASCULAR CATHETERIZATION  06/27/2015   Procedure: Lower Extremity Intervention;  Surgeon: Algernon [name], MD;  Location: [facility];  Service: Cardiovascular;;   Current Outpatient Medications on File Prior to Visit  Medication Sig Dispense Refill  . acetaminophen (TYLENOL) 500 MG tablet Take 1,000 mg by mouth every 8 (eight) hours as needed for mild pain.     [name] amLODipine (NORVASC) 10 MG tablet Take 1 tablet (10 mg total) by mouth at bedtime. 90 tablet 3  . apixaban (ELIQUIS) 2.5 MG TABS tablet Take 1 tablet (2.5 mg total) by mouth 2 (two) times daily. 60 tablet 0  . B Complex Vitamins (B COMPLEX-B12) TABS Take 1 tablet by mouth daily.     [name]  Blood Glucose Monitoring Suppl (ONE TOUCH ULTRA SYSTEM KIT) W/DEVICE KIT 1 kit by Does not apply route once. 1 each 0  . cholecalciferol (VITAMIN D) 1000 UNITS tablet Take 1,000 Units by mouth 2 (two) times a week. Takes on Mon and Fri    . CINNAMON PO Take by mouth 2 (two) times daily.     [name] ELIQUIS 2.5 MG TABS tablet Take 1 tablet by mouth twice a day 180 tablet 1  . fenofibrate 160 MG tablet Take 1 tablet (160 mg total) by mouth daily. 90 tablet 2  . fish oil-omega-3 fatty acids 1000 MG capsule Take 3 g by mouth daily.     [name] glucose blood test strip Use as instructed 100 each 3  . lisinopril (PRINIVIL,ZESTRIL) 40 MG tablet Take 1 tablet by mouth every day 90 tablet 0  . metoprolol tartrate (LOPRESSOR) 50 MG tablet Take 1 tablet (50 mg total) by mouth 2 (two) times daily. 180  tablet 3  . Multiple Vitamin (DAILY MULTIVITAMIN PO) Take 1 tablet by mouth daily.      [name] DELICA LANCETS FINE MISC 1 each by Does not apply route daily. 100 each 3  . rosuvastatin (CRESTOR) 40 MG tablet Take 1 tablet by mouth daily 90 tablet 2   No current facility-administered medications on file prior to visit.    Allergies  Allergen Reactions  . Isosorbide Mononitrate Other (See Comments)    Unknown   . Quinidine Gluconate Other (See Comments)    Unknown  . Ramipril Other (See Comments)    Unknown   Social History   Socioeconomic History  . Marital status: Married    Spouse name: Not on file  . Number of children: 5  . Years of education: Not on file  . Highest education level: Not on file  Social Needs  . Financial resource strain: Not on file  . Food insecurity - worry: Not on file  . Food insecurity - inability: Not on file  . Transportation needs - medical: Not on file  . Transportation needs - non-medical: Not on file  Occupational History  . Occupation: [employer]    Employer: RETIRED  Tobacco Use  . Smoking status: Never Smoker  . Smokeless tobacco: Former Network engineer and Sexual Activity  . Alcohol use: No  . Drug use: No  . Sexual activity: No  Other Topics Concern  . Not on file  Social History Narrative   Retired now does some antiques   Retired from Theatre manager and dye work   Married 1951   5 kids      Review of Systems  All other systems reviewed and are negative.      Objective:   Physical Exam  Constitutional: He appears well-developed and well-nourished. No distress.  HENT:  Mouth/Throat: Oropharynx is clear and moist.  Neck: Neck supple. No JVD present. No thyromegaly present.  Cardiovascular: Normal rate and normal heart sounds. An irregularly irregular rhythm present.  Pulmonary/Chest: Breath sounds normal. No respiratory distress. He has no wheezes. He has no rales.  Abdominal: Soft. Bowel sounds are normal. He exhibits  no distension and no mass. There is no tenderness. There is no rebound and no guarding.  Musculoskeletal: He exhibits edema.  Lymphadenopathy:    He has no cervical adenopathy.  Skin: Rash noted. He is not diaphoretic. No erythema.  Vitals reviewed.   +1 edema to the knee bilaterally with venous stasis dermatitis on both legs (anterior shins)  Assessment & Plan:  Leg swelling - Plan: BASIC METABOLIC PANEL WITH GFR  Check BMP to monitor renal function.  Discontinue amlodipine as I believe this is contributing to the swelling.  Recheck blood pressure in 1 week to ensure blood pressure is not adversely affected by discontinuation of amlodipine.  Increase Lasix from 20 mg a day to 40 mg a day.  Also recheck potassium in 1 week on the higher dose diuretic.  Hopefully by increasing the dose of the diuretic and discontinuing the amlodipine, the swelling of the legs will improve.  I believe the rash is venous stasis dermatitis.  If the swelling improves, the rash will improve.  However I will treat the rash symptomatically with Elocon cream applied once daily for comfort.  Reassess the rash in 1 week.

## 2017-07-07 ENCOUNTER — Other Ambulatory Visit: Payer: Self-pay

## 2017-07-07 ENCOUNTER — Encounter: Payer: Self-pay | Admitting: Family Medicine

## 2017-07-07 ENCOUNTER — Ambulatory Visit: Payer: PPO | Admitting: Family Medicine

## 2017-07-07 VITALS — BP 136/70 | HR 58 | Temp 97.9°F | Resp 14 | Ht 65.0 in | Wt 178.0 lb

## 2017-07-07 DIAGNOSIS — I10 Essential (primary) hypertension: Secondary | ICD-10-CM | POA: Diagnosis not present

## 2017-07-07 DIAGNOSIS — I872 Venous insufficiency (chronic) (peripheral): Secondary | ICD-10-CM | POA: Diagnosis not present

## 2017-07-07 DIAGNOSIS — N183 Chronic kidney disease, stage 3 unspecified: Secondary | ICD-10-CM

## 2017-07-07 DIAGNOSIS — I739 Peripheral vascular disease, unspecified: Secondary | ICD-10-CM | POA: Diagnosis not present

## 2017-07-07 DIAGNOSIS — R609 Edema, unspecified: Secondary | ICD-10-CM

## 2017-07-07 LAB — BASIC METABOLIC PANEL WITH GFR
BUN/Creatinine Ratio: 19 (calc) (ref 6–22)
BUN: 36 mg/dL — AB (ref 7–25)
CALCIUM: 10.3 mg/dL (ref 8.6–10.3)
CO2: 28 mmol/L (ref 20–32)
CREATININE: 1.91 mg/dL — AB (ref 0.70–1.11)
Chloride: 99 mmol/L (ref 98–110)
GFR, EST AFRICAN AMERICAN: 36 mL/min/{1.73_m2} — AB (ref >=60)
GFR, EST NON AFRICAN AMERICAN: 31 mL/min/{1.73_m2} — AB (ref >=60)
Glucose, Bld: 133 mg/dL — ABNORMAL HIGH (ref 65–99)
POTASSIUM: 4.8 mmol/L (ref 3.5–5.3)
Sodium: 135 mmol/L (ref 135–146)

## 2017-07-07 NOTE — Assessment & Plan Note (Signed)
Recheck renal function Overall weight down to baseline, edema has resolved. Blood pressuer looks good Stay off amlodipine Will recheck labs on the 40mg  of lasix, may be able to drop back to 20mg  once a a day and see how he does.  Elocon has helped per patient, he can use as needed and try to moiturize dry areas

## 2017-07-07 NOTE — Patient Instructions (Addendum)
We will call with lab results  Continue the potassium 40mg  once a day, until we call  Stay off the amlodipine  F/U 3-4 months with [name]

## 2017-07-07 NOTE — Progress Notes (Signed)
   Subjective:    Patient ID: Eric [name]., male    DOB: [birth year], 83 y.o.   MRN: [id number]  Patient presents for Follow-up (is not fasting)  Patient here for interim follow-up.  He was seen by PCP on November 21 at that time he presented with leg swelling and his weight was up 6 pounds.  He 83 years old has history of heart disease as well as A. fib chronic kidney disease His amlodipine was discontinued as this could be a culprit to his edema.  His Lasix was increased from 20 mg a day to 40 mg a day.  He also had venous stasis rash in which Elocon cream was prescribed. Review of labs from the 21st BUN was 26 creatinine 1.59 potassium was normal at 5.2  His previous BUN was 24 creatinine 1.63 in July 2018 His weight on the 21st was 184 pounds today 178 pounds  No concerns today, no SOB, no chest pain     Review Of Systems:  GEN- denies fatigue, fever, weight loss,weakness, recent illness HEENT- denies eye drainage, change in vision, nasal discharge, CVS- denies chest pain, palpitations RESP- denies SOB, cough, wheeze ABD- denies N/V, change in stools, abd pain GU- denies dysuria, hematuria, dribbling, incontinence MSK- denies joint pain, muscle aches, injury Neuro- denies headache, dizziness, syncope, seizure activity       Objective:    BP 136/70   Pulse (!) 58   Temp 97.9 F (36.6 C) (Oral)   Resp 14   Ht 5\' 5"  (1.651 m)   Wt 178 lb (80.7 kg)   SpO2 96%   BMI 29.62 kg/m  GEN- NAD, alert and oriented x3 HEENT- MMM  CVS- irregular rhythem, no murmur RESP-CTAB EXT- No edema, venous stasis changes, dry skin  Pulses- Radial,  2+        Assessment & Plan:      Problem List Items Addressed This Visit      Unprioritized   PVD (peripheral vascular disease) ([place])   Essential hypertension   Relevant Orders   BASIC METABOLIC PANEL WITH GFR   Chronic kidney disease, stage III (moderate) (HCC) - Primary    Recheck renal function Overall weight down to  baseline, edema has resolved. Blood pressuer looks good Stay off amlodipine Will recheck labs on the 40mg  of lasix, may be able to drop back to 20mg  once a a day and see how he does.  Elocon has helped per patient, he can use as needed and try to moiturize dry areas      Relevant Orders   BASIC METABOLIC PANEL WITH GFR    Other Visit Diagnoses    Peripheral edema       Venous stasis dermatitis of both lower extremities          Note: This dictation was prepared with Dragon dictation along with smaller phrase technology. Any transcriptional errors that result from this process are unintentional.

## 2017-07-08 ENCOUNTER — Other Ambulatory Visit: Payer: Self-pay | Admitting: *Deleted

## 2017-07-08 DIAGNOSIS — R7989 Other specified abnormal findings of blood chemistry: Secondary | ICD-10-CM

## 2017-07-08 MED ORDER — FUROSEMIDE 20 MG PO TABS: 20.0000 mg | ORAL_TABLET | Freq: Every day | ORAL | 3 refills | Status: DC

## 2017-07-13 DIAGNOSIS — H2511 Age-related nuclear cataract, right eye: Secondary | ICD-10-CM | POA: Diagnosis not present

## 2017-07-21 ENCOUNTER — Encounter: Payer: Self-pay | Admitting: *Deleted

## 2017-07-21 ENCOUNTER — Other Ambulatory Visit: Payer: Self-pay

## 2017-07-21 NOTE — Discharge Instructions (Signed)

## 2017-07-28 ENCOUNTER — Ambulatory Visit: Payer: PPO | Admitting: Anesthesiology

## 2017-07-28 ENCOUNTER — Encounter
Admission: RE | Disposition: A | Discharge status: Home / Self Care | Payer: Self-pay | Source: Ambulatory Visit | Attending: Ophthalmology

## 2017-07-28 ENCOUNTER — Ambulatory Visit
Admission: RE | Admit: 2017-07-28 | Discharge: 2017-07-28 | Disposition: A | Discharge status: Home / Self Care | Payer: PPO | Source: Ambulatory Visit | Attending: Ophthalmology | Admitting: Ophthalmology

## 2017-07-28 DIAGNOSIS — E1151 Type 2 diabetes mellitus with diabetic peripheral angiopathy without gangrene: Secondary | ICD-10-CM | POA: Diagnosis not present

## 2017-07-28 DIAGNOSIS — N189 Chronic kidney disease, unspecified: Secondary | ICD-10-CM | POA: Insufficient documentation

## 2017-07-28 DIAGNOSIS — Z888 Allergy status to other drugs, medicaments and biological substances status: Secondary | ICD-10-CM | POA: Diagnosis not present

## 2017-07-28 DIAGNOSIS — H2511 Age-related nuclear cataract, right eye: Secondary | ICD-10-CM | POA: Diagnosis not present

## 2017-07-28 DIAGNOSIS — I4891 Unspecified atrial fibrillation: Secondary | ICD-10-CM | POA: Insufficient documentation

## 2017-07-28 DIAGNOSIS — E7800 Pure hypercholesterolemia, unspecified: Secondary | ICD-10-CM | POA: Insufficient documentation

## 2017-07-28 DIAGNOSIS — I2510 Atherosclerotic heart disease of native coronary artery without angina pectoris: Secondary | ICD-10-CM | POA: Insufficient documentation

## 2017-07-28 DIAGNOSIS — G4730 Sleep apnea, unspecified: Secondary | ICD-10-CM | POA: Insufficient documentation

## 2017-07-28 DIAGNOSIS — Z951 Presence of aortocoronary bypass graft: Secondary | ICD-10-CM | POA: Insufficient documentation

## 2017-07-28 DIAGNOSIS — I129 Hypertensive chronic kidney disease with stage 1 through stage 4 chronic kidney disease, or unspecified chronic kidney disease: Secondary | ICD-10-CM | POA: Diagnosis not present

## 2017-07-28 DIAGNOSIS — E1122 Type 2 diabetes mellitus with diabetic chronic kidney disease: Secondary | ICD-10-CM | POA: Insufficient documentation

## 2017-07-28 DIAGNOSIS — M1990 Unspecified osteoarthritis, unspecified site: Secondary | ICD-10-CM | POA: Insufficient documentation

## 2017-07-28 HISTORY — DX: Presence of dental prosthetic device (complete) (partial): Z97.2

## 2017-07-28 HISTORY — PX: CATARACT EXTRACTION W/PHACO: SHX586

## 2017-07-28 HISTORY — DX: Presence of external hearing-aid: Z97.4

## 2017-07-28 LAB — GLUCOSE, CAPILLARY: GLUCOSE-CAPILLARY: 113 mg/dL — AB (ref 65–99)

## 2017-07-28 SURGERY — PHACOEMULSIFICATION, CATARACT, WITH IOL INSERTION
Anesthesia: Monitor Anesthesia Care | Site: Eye | Laterality: Right | Wound class: Clean

## 2017-07-28 MED ORDER — ARMC OPHTHALMIC DILATING DROPS
1.0000 "application " | OPHTHALMIC | Status: DC | PRN
  Administered 2017-07-28 (×3): 1 via OPHTHALMIC

## 2017-07-28 MED ORDER — NA HYALUR & NA CHOND-NA HYALUR 0.4-0.35 ML IO KIT
PACK | INTRAOCULAR | Status: DC | PRN
  Administered 2017-07-28: 1 mL via INTRAOCULAR

## 2017-07-28 MED ORDER — MOXIFLOXACIN HCL 0.5 % OP SOLN
1.0000 [drp] | OPHTHALMIC | Status: DC | PRN
  Administered 2017-07-28 (×3): 1 [drp] via OPHTHALMIC

## 2017-07-28 MED ORDER — GLYCOPYRROLATE 0.2 MG/ML IJ SOLN
INTRAMUSCULAR | Status: DC | PRN
  Administered 2017-07-28: 0.2 mg via INTRAVENOUS

## 2017-07-28 MED ORDER — BRIMONIDINE TARTRATE-TIMOLOL 0.2-0.5 % OP SOLN
OPHTHALMIC | Status: DC | PRN
  Administered 2017-07-28: 1 [drp] via OPHTHALMIC

## 2017-07-28 MED ORDER — FENTANYL CITRATE (PF) 100 MCG/2ML IJ SOLN
INTRAMUSCULAR | Status: DC | PRN
  Administered 2017-07-28: 50 ug via INTRAVENOUS

## 2017-07-28 MED ORDER — LIDOCAINE HCL (PF) 2 % IJ SOLN
INTRAOCULAR | Status: DC | PRN
  Administered 2017-07-28: 1 mL via INTRAOCULAR

## 2017-07-28 MED ORDER — CEFUROXIME OPHTHALMIC INJECTION 1 MG/0.1 ML
INJECTION | OPHTHALMIC | Status: DC | PRN
  Administered 2017-07-28: 0.1 mL via INTRACAMERAL

## 2017-07-28 MED ORDER — MIDAZOLAM HCL 2 MG/2ML IJ SOLN
INTRAMUSCULAR | Status: DC | PRN
  Administered 2017-07-28: 0.5 mg via INTRAVENOUS

## 2017-07-28 MED ORDER — EPINEPHRINE PF 1 MG/ML IJ SOLN
INTRAOCULAR | Status: DC | PRN
  Administered 2017-07-28: 75 mL via OPHTHALMIC

## 2017-07-28 SURGICAL SUPPLY — 26 items
CANNULA ANT/CHMB 27G (MISCELLANEOUS) ×1 IMPLANT
CANNULA ANT/CHMB 27GA (MISCELLANEOUS) ×3 IMPLANT
GLOVE SURG LX 7.5 STRW (GLOVE) ×2
GLOVE SURG LX STRL 7.5 STRW (GLOVE) ×1 IMPLANT
GLOVE SURG TRIUMPH 8.0 PF LTX (GLOVE) ×3 IMPLANT
GOWN STRL REUS W/ TWL LRG LVL3 (GOWN DISPOSABLE) ×2 IMPLANT
GOWN STRL REUS W/TWL LRG LVL3 (GOWN DISPOSABLE) ×6
LENS IOL TECNIS ITEC 21.0 (Intraocular Lens) ×2 IMPLANT
MARKER SKIN DUAL TIP RULER LAB (MISCELLANEOUS) ×3 IMPLANT
NDL FILTER BLUNT 18X1 1/2 (NEEDLE) ×1 IMPLANT
NDL RETROBULBAR .5 NSTRL (NEEDLE) IMPLANT
NEEDLE FILTER BLUNT 18X 1/2SAF (NEEDLE) ×2
NEEDLE FILTER BLUNT 18X1 1/2 (NEEDLE) ×1 IMPLANT
PACK CATARACT BRASINGTON (MISCELLANEOUS) ×3 IMPLANT
PACK EYE AFTER SURG (MISCELLANEOUS) ×3 IMPLANT
PACK OPTHALMIC (MISCELLANEOUS) ×3 IMPLANT
RING MALYGIN 7.0 (MISCELLANEOUS) IMPLANT
SUT ETHILON 10-0 CS-B-6CS-B-6 (SUTURE)
SUT VICRYL  9 0 (SUTURE)
SUT VICRYL 9 0 (SUTURE) IMPLANT
SUTURE EHLN 10-0 CS-B-6CS-B-6 (SUTURE) IMPLANT
SYR 3ML LL SCALE MARK (SYRINGE) ×3 IMPLANT
SYR 5ML LL (SYRINGE) ×3 IMPLANT
SYR TB 1ML LUER SLIP (SYRINGE) ×3 IMPLANT
WATER STERILE IRR 250ML POUR (IV SOLUTION) ×3 IMPLANT
WIPE NON LINTING 3.25X3.25 (MISCELLANEOUS) ×3 IMPLANT

## 2017-07-28 NOTE — H&P (Signed)
The History and Physical notes are on paper, have been signed, and are to be scanned. The patient remains stable and unchanged from the H&P.   Previous H&P reviewed, patient examined, and there are no changes.  Eric [name] 07/28/2017 7:46 AM   

## 2017-07-28 NOTE — Anesthesia Procedure Notes (Signed)
Procedure Name: MAC Date/Time: 07/28/2017 8:25 AM Performed by: Janna Arch, CRNA Pre-anesthesia Checklist: Patient identified, Emergency Drugs available, Suction available and Patient being monitored Patient Re-evaluated:Patient Re-evaluated prior to induction Oxygen Delivery Method: Nasal cannula

## 2017-07-28 NOTE — Transfer of Care (Signed)
Immediate Anesthesia Transfer of Care Note  Patient: Eric [name].  Procedure(s) Performed: CATARACT EXTRACTION PHACO AND INTRAOCULAR LENS PLACEMENT (IOC) RIGHT DIABETIC (Right Eye)  Patient Location: PACU  Anesthesia Type: MAC  Level of Consciousness: awake, alert  and patient cooperative  Airway and Oxygen Therapy: Patient Spontanous Breathing and Patient connected to supplemental oxygen  Post-op Assessment: Post-op Vital signs reviewed, Patient's Cardiovascular Status Stable, Respiratory Function Stable, Patent Airway and No signs of Nausea or vomiting  Post-op Vital Signs: Reviewed and stable  Complications: No apparent anesthesia complications

## 2017-07-28 NOTE — Anesthesia Preprocedure Evaluation (Signed)
Anesthesia Evaluation  Patient identified by MRN, date of birth, ID band Patient awake    Reviewed: Allergy & Precautions, H&P , NPO status , Patient's Chart, lab work & pertinent test results, reviewed documented beta blocker date and time   Airway Mallampati: II  TM Distance: >3 FB Neck ROM: full    Dental no notable dental hx.    Pulmonary sleep apnea (mild) ,    Pulmonary exam normal breath sounds clear to auscultation       Cardiovascular Exercise Tolerance: Good hypertension, + CAD and + Peripheral Vascular Disease  + dysrhythmias Atrial Fibrillation  Rhythm:regular Rate:Normal     Neuro/Psych negative neurological ROS  negative psych ROS   GI/Hepatic negative GI ROS, Neg liver ROS,   Endo/Other  diabetes, Type 2  Renal/GU CRFRenal disease  negative genitourinary   Musculoskeletal   Abdominal   Peds  Hematology negative hematology ROS (+)   Anesthesia Other Findings   Reproductive/Obstetrics negative OB ROS                             Anesthesia Physical  Anesthesia Plan  ASA: II  Anesthesia Plan: MAC   Post-op Pain Management:    Induction:   PONV Risk Score and Plan:   Airway Management Planned:   Additional Equipment:   Intra-op Plan:   Post-operative Plan:   Informed Consent: I have reviewed the patients History and Physical, chart, labs and discussed the procedure including the risks, benefits and alternatives for the proposed anesthesia with the patient or authorized representative who has indicated his/her understanding and acceptance.   Dental Advisory Given  Plan Discussed with: CRNA  Anesthesia Plan Comments:         Anesthesia Quick Evaluation  

## 2017-07-28 NOTE — Anesthesia Postprocedure Evaluation (Signed)
Anesthesia Post Note  Patient: Eric [name].  Procedure(s) Performed: CATARACT EXTRACTION PHACO AND INTRAOCULAR LENS PLACEMENT (IOC) RIGHT DIABETIC (Right Eye)  Patient location during evaluation: PACU Anesthesia Type: MAC Level of consciousness: awake and alert Pain management: pain level controlled Vital Signs Assessment: post-procedure vital signs reviewed and stable Respiratory status: spontaneous breathing, nonlabored ventilation, respiratory function stable and patient connected to nasal cannula oxygen Cardiovascular status: stable and blood pressure returned to baseline Postop Assessment: no apparent nausea or vomiting Anesthetic complications: no    Alisa Graff

## 2017-07-28 NOTE — Op Note (Signed)
LOCATION:  [place]   PREOPERATIVE DIAGNOSIS:    Nuclear sclerotic cataract right eye. H25.11   POSTOPERATIVE DIAGNOSIS:  Nuclear sclerotic cataract right eye.     PROCEDURE:  Phacoemusification with posterior chamber intraocular lens placement of the right eye   LENS:   Implant Name Type Inv. Item Serial No. Manufacturer Lot No. LRB No. Used  LENS IOL DIOP 21.0 - [id number] Intraocular Lens LENS IOL DIOP 21.0 [id number]  Right 1        ULTRASOUND TIME: 26 % of 1 minutes, 44 seconds.  CDE 27.4   SURGEON:  [name], MD   ANESTHESIA:  Topical with tetracaine drops and 2% Xylocaine jelly, augmented with 1% preservative-free intracameral lidocaine.    COMPLICATIONS:  None.   DESCRIPTION OF PROCEDURE:  The patient was identified in the holding room and transported to the operating room and placed in the supine position under the operating microscope.  The right eye was identified as the operative eye and it was prepped and draped in the usual sterile ophthalmic fashion.   A 1 millimeter clear-corneal paracentesis was made at the 12:00 position.  0.5 ml of preservative-free 1% lidocaine was injected into the anterior chamber. The anterior chamber was filled with Viscoat viscoelastic.  A 2.4 millimeter keratome was used to make a near-clear corneal incision at the 9:00 position.  A curvilinear capsulorrhexis was made with a cystotome and capsulorrhexis forceps.  Balanced salt solution was used to hydrodissect and hydrodelineate the nucleus.   Phacoemulsification was then used in stop and chop fashion to remove the lens nucleus and epinucleus.  The remaining cortex was then removed using the irrigation and aspiration handpiece. Provisc was then placed into the capsular bag to distend it for lens placement.  A lens was then injected into the capsular bag.  The remaining viscoelastic was aspirated.   Wounds were hydrated with balanced salt solution.  The anterior  chamber was inflated to a physiologic pressure with balanced salt solution.  No wound leaks were noted. Cefuroxime 0.1 ml of a 10mg /ml solution was injected into the anterior chamber for a dose of 1 mg of intracameral antibiotic at the completion of the case.   Timolol and Brimonidine drops were applied to the eye.  The patient was taken to the recovery room in stable condition without complications of anesthesia or surgery.   Jeremy Mclamb 07/28/2017, 8:46 AM

## 2017-07-30 ENCOUNTER — Encounter: Payer: Self-pay | Admitting: Ophthalmology

## 2017-08-04 DIAGNOSIS — H2512 Age-related nuclear cataract, left eye: Secondary | ICD-10-CM | POA: Diagnosis not present

## 2017-08-05 ENCOUNTER — Other Ambulatory Visit: Payer: Self-pay

## 2017-08-05 ENCOUNTER — Encounter: Payer: Self-pay | Admitting: *Deleted

## 2017-08-11 NOTE — Discharge Instructions (Signed)

## 2017-08-16 ENCOUNTER — Other Ambulatory Visit: Payer: PPO

## 2017-08-16 ENCOUNTER — Other Ambulatory Visit: Payer: Self-pay | Admitting: Family Medicine

## 2017-08-16 DIAGNOSIS — R7989 Other specified abnormal findings of blood chemistry: Secondary | ICD-10-CM

## 2017-08-16 LAB — COMPLETE METABOLIC PANEL WITH GFR
AG RATIO: 1.4 (calc) (ref 1.0–2.5)
ALBUMIN MSPROF: 4.1 g/dL (ref 3.6–5.1)
ALT: 17 U/L (ref 9–46)
AST: 31 U/L (ref 10–35)
Alkaline phosphatase (APISO): 37 U/L — ABNORMAL LOW (ref 40–115)
BUN / CREAT RATIO: 23 (calc) — AB (ref 6–22)
BUN: 31 mg/dL — ABNORMAL HIGH (ref 7–25)
CO2: 25 mmol/L (ref 20–32)
Calcium: 9.6 mg/dL (ref 8.6–10.3)
Chloride: 102 mmol/L (ref 98–110)
Creat: 1.32 mg/dL — ABNORMAL HIGH (ref 0.70–1.11)
GFR, EST AFRICAN AMERICAN: 56 mL/min/{1.73_m2} — AB (ref >=60)
GFR, Est Non African American: 48 mL/min/{1.73_m2} — ABNORMAL LOW (ref >=60)
GLOBULIN: 2.9 g/dL (ref 1.9–3.7)
Glucose, Bld: 121 mg/dL — ABNORMAL HIGH (ref 65–99)
POTASSIUM: 4.6 mmol/L (ref 3.5–5.3)
SODIUM: 135 mmol/L (ref 135–146)
TOTAL PROTEIN: 7 g/dL (ref 6.1–8.1)
Total Bilirubin: 0.6 mg/dL (ref 0.2–1.2)

## 2017-08-16 LAB — EXTRA LAV TOP TUBE

## 2017-08-18 ENCOUNTER — Ambulatory Visit: Payer: PPO | Admitting: Anesthesiology

## 2017-08-18 ENCOUNTER — Encounter
Admission: RE | Disposition: A | Discharge status: Home / Self Care | Payer: Self-pay | Source: Ambulatory Visit | Attending: Ophthalmology

## 2017-08-18 ENCOUNTER — Ambulatory Visit
Admission: RE | Admit: 2017-08-18 | Discharge: 2017-08-18 | Disposition: A | Discharge status: Home / Self Care | Payer: PPO | Source: Ambulatory Visit | Attending: Ophthalmology | Admitting: Ophthalmology

## 2017-08-18 DIAGNOSIS — I739 Peripheral vascular disease, unspecified: Secondary | ICD-10-CM | POA: Diagnosis not present

## 2017-08-18 DIAGNOSIS — I10 Essential (primary) hypertension: Secondary | ICD-10-CM | POA: Insufficient documentation

## 2017-08-18 DIAGNOSIS — I4891 Unspecified atrial fibrillation: Secondary | ICD-10-CM | POA: Diagnosis not present

## 2017-08-18 DIAGNOSIS — E119 Type 2 diabetes mellitus without complications: Secondary | ICD-10-CM | POA: Insufficient documentation

## 2017-08-18 DIAGNOSIS — I2510 Atherosclerotic heart disease of native coronary artery without angina pectoris: Secondary | ICD-10-CM | POA: Diagnosis not present

## 2017-08-18 DIAGNOSIS — H2512 Age-related nuclear cataract, left eye: Secondary | ICD-10-CM | POA: Diagnosis not present

## 2017-08-18 DIAGNOSIS — G4730 Sleep apnea, unspecified: Secondary | ICD-10-CM | POA: Insufficient documentation

## 2017-08-18 HISTORY — PX: CATARACT EXTRACTION W/PHACO: SHX586

## 2017-08-18 LAB — GLUCOSE, CAPILLARY: Glucose-Capillary: 100 mg/dL — ABNORMAL HIGH (ref 65–99)

## 2017-08-18 SURGERY — PHACOEMULSIFICATION, CATARACT, WITH IOL INSERTION
Anesthesia: Monitor Anesthesia Care | Site: Eye | Laterality: Left | Wound class: Clean

## 2017-08-18 MED ORDER — EPINEPHRINE PF 1 MG/ML IJ SOLN
INTRAOCULAR | Status: DC | PRN
  Administered 2017-08-18: 61 mL via OPHTHALMIC

## 2017-08-18 MED ORDER — NA HYALUR & NA CHOND-NA HYALUR 0.4-0.35 ML IO KIT
PACK | INTRAOCULAR | Status: DC | PRN
  Administered 2017-08-18: 1 mL via INTRAOCULAR

## 2017-08-18 MED ORDER — GLYCOPYRROLATE 0.2 MG/ML IJ SOLN
INTRAMUSCULAR | Status: DC | PRN
  Administered 2017-08-18: 0.2 mg via INTRAVENOUS

## 2017-08-18 MED ORDER — FENTANYL CITRATE (PF) 100 MCG/2ML IJ SOLN
INTRAMUSCULAR | Status: DC | PRN
  Administered 2017-08-18: 50 ug via INTRAVENOUS

## 2017-08-18 MED ORDER — LIDOCAINE HCL (PF) 2 % IJ SOLN
INTRAOCULAR | Status: DC | PRN
  Administered 2017-08-18: 1 mL

## 2017-08-18 MED ORDER — BRIMONIDINE TARTRATE-TIMOLOL 0.2-0.5 % OP SOLN
OPHTHALMIC | Status: DC | PRN
  Administered 2017-08-18: 1 [drp] via OPHTHALMIC

## 2017-08-18 MED ORDER — MIDAZOLAM HCL 2 MG/2ML IJ SOLN
INTRAMUSCULAR | Status: DC | PRN
  Administered 2017-08-18: 1 mg via INTRAVENOUS

## 2017-08-18 MED ORDER — CEFUROXIME OPHTHALMIC INJECTION 1 MG/0.1 ML
INJECTION | OPHTHALMIC | Status: DC | PRN
  Administered 2017-08-18: 0.1 mL via INTRACAMERAL

## 2017-08-18 MED ORDER — LACTATED RINGERS IV SOLN: INTRAVENOUS | Status: DC

## 2017-08-18 MED ORDER — MOXIFLOXACIN HCL 0.5 % OP SOLN
1.0000 [drp] | OPHTHALMIC | Status: DC | PRN
  Administered 2017-08-18 (×3): 1 [drp] via OPHTHALMIC

## 2017-08-18 MED ORDER — ARMC OPHTHALMIC DILATING DROPS
1.0000 "application " | OPHTHALMIC | Status: DC | PRN
  Administered 2017-08-18 (×3): 1 via OPHTHALMIC

## 2017-08-18 SURGICAL SUPPLY — 27 items
CANNULA ANT/CHMB 27G (MISCELLANEOUS) ×1 IMPLANT
CANNULA ANT/CHMB 27GA (MISCELLANEOUS) ×3 IMPLANT
CARTRIDGE ABBOTT (MISCELLANEOUS) IMPLANT
GLOVE SURG LX 7.5 STRW (GLOVE) ×4
GLOVE SURG LX STRL 7.5 STRW (GLOVE) ×1 IMPLANT
GLOVE SURG TRIUMPH 8.0 PF LTX (GLOVE) ×3 IMPLANT
GOWN STRL REUS W/ TWL LRG LVL3 (GOWN DISPOSABLE) ×2 IMPLANT
GOWN STRL REUS W/TWL LRG LVL3 (GOWN DISPOSABLE) ×6
LENS IOL TECNIS ITEC 21.0 (Intraocular Lens) ×2 IMPLANT
MARKER SKIN DUAL TIP RULER LAB (MISCELLANEOUS) ×3 IMPLANT
NDL FILTER BLUNT 18X1 1/2 (NEEDLE) ×1 IMPLANT
NDL RETROBULBAR .5 NSTRL (NEEDLE) IMPLANT
NEEDLE FILTER BLUNT 18X 1/2SAF (NEEDLE) ×2
NEEDLE FILTER BLUNT 18X1 1/2 (NEEDLE) ×1 IMPLANT
PACK CATARACT BRASINGTON (MISCELLANEOUS) ×3 IMPLANT
PACK EYE AFTER SURG (MISCELLANEOUS) ×3 IMPLANT
PACK OPTHALMIC (MISCELLANEOUS) ×3 IMPLANT
RING MALYGIN 7.0 (MISCELLANEOUS) IMPLANT
SUT ETHILON 10-0 CS-B-6CS-B-6 (SUTURE)
SUT VICRYL  9 0 (SUTURE)
SUT VICRYL 9 0 (SUTURE) IMPLANT
SUTURE EHLN 10-0 CS-B-6CS-B-6 (SUTURE) IMPLANT
SYR 3ML LL SCALE MARK (SYRINGE) ×3 IMPLANT
SYR 5ML LL (SYRINGE) ×3 IMPLANT
SYR TB 1ML LUER SLIP (SYRINGE) ×3 IMPLANT
WATER STERILE IRR 250ML POUR (IV SOLUTION) ×3 IMPLANT
WIPE NON LINTING 3.25X3.25 (MISCELLANEOUS) ×3 IMPLANT

## 2017-08-18 NOTE — H&P (Signed)
The History and Physical notes are on paper, have been signed, and are to be scanned. The patient remains stable and unchanged from the H&P.   Previous H&P reviewed, patient examined, and there are no changes.  Eric [name] 08/18/2017 7:20 AM '

## 2017-08-18 NOTE — Op Note (Signed)
OPERATIVE NOTE  Eric [name] [id number] 08/18/2017   PREOPERATIVE DIAGNOSIS:  Nuclear sclerotic cataract left eye. H25.12   POSTOPERATIVE DIAGNOSIS:    Nuclear sclerotic cataract left eye.     PROCEDURE:  Phacoemusification with posterior chamber intraocular lens placement of the left eye   LENS:   Implant Name Type Inv. Item Serial No. Manufacturer Lot No. LRB No. Used  LENS IOL DIOP 21.0 - [id number] Intraocular Lens LENS IOL DIOP 21.0 [id number] AMO  Left 1        ULTRASOUND TIME: 19  % of 1 minutes 24 seconds, CDE 15.9  SURGEON:  [name], MD   ANESTHESIA:  Topical with tetracaine drops and 2% Xylocaine jelly, augmented with 1% preservative-free intracameral lidocaine.    COMPLICATIONS:  None.   DESCRIPTION OF PROCEDURE:  The patient was identified in the holding room and transported to the operating room and placed in the supine position under the operating microscope.  The left eye was identified as the operative eye and it was prepped and draped in the usual sterile ophthalmic fashion.   A 1 millimeter clear-corneal paracentesis was made at the 1:30 position.  0.5 ml of preservative-free 1% lidocaine was injected into the anterior chamber.  The anterior chamber was filled with Viscoat viscoelastic.  A 2.4 millimeter keratome was used to make a near-clear corneal incision at the 10:30 position.  .  A curvilinear capsulorrhexis was made with a cystotome and capsulorrhexis forceps.  Balanced salt solution was used to hydrodissect and hydrodelineate the nucleus.   Phacoemulsification was then used in stop and chop fashion to remove the lens nucleus and epinucleus.  The remaining cortex was then removed using the irrigation and aspiration handpiece. Provisc was then placed into the capsular bag to distend it for lens placement.  A lens was then injected into the capsular bag.  The remaining viscoelastic was aspirated.   Wounds were hydrated with balanced salt  solution.  The anterior chamber was inflated to a physiologic pressure with balanced salt solution.  No wound leaks were noted. Cefuroxime 0.1 ml of a 10mg /ml solution was injected into the anterior chamber for a dose of 1 mg of intracameral antibiotic at the completion of the case.   Timolol and Brimonidine drops were applied to the eye.  The patient was taken to the recovery room in stable condition without complications of anesthesia or surgery.  [name] 08/18/2017, 7:43 AM

## 2017-08-18 NOTE — Anesthesia Postprocedure Evaluation (Signed)
Anesthesia Post Note  Patient: Eric [name].  Procedure(s) Performed: CATARACT EXTRACTION PHACO AND INTRAOCULAR LENS PLACEMENT (IOC) (Left Eye)  Patient location during evaluation: PACU Anesthesia Type: MAC Level of consciousness: awake and alert Pain management: pain level controlled Vital Signs Assessment: post-procedure vital signs reviewed and stable Respiratory status: spontaneous breathing, nonlabored ventilation, respiratory function stable and patient connected to nasal cannula oxygen Cardiovascular status: blood pressure returned to baseline and stable Postop Assessment: no apparent nausea or vomiting Anesthetic complications: no    SCOURAS, NICOLE ELAINE

## 2017-08-18 NOTE — Transfer of Care (Signed)
Immediate Anesthesia Transfer of Care Note  Patient: Eric [name].  Procedure(s) Performed: CATARACT EXTRACTION PHACO AND INTRAOCULAR LENS PLACEMENT (IOC) (Left Eye)  Patient Location: PACU  Anesthesia Type: MAC  Level of Consciousness: awake, alert  and patient cooperative  Airway and Oxygen Therapy: Patient Spontanous Breathing and Patient connected to supplemental oxygen  Post-op Assessment: Post-op Vital signs reviewed, Patient's Cardiovascular Status Stable, Respiratory Function Stable, Patent Airway and No signs of Nausea or vomiting  Post-op Vital Signs: Reviewed and stable  Complications: No apparent anesthesia complications

## 2017-08-18 NOTE — Anesthesia Preprocedure Evaluation (Signed)
Anesthesia Evaluation  Patient identified by MRN, date of birth, ID band Patient awake    Reviewed: Allergy & Precautions, H&P , NPO status , Patient's Chart, lab work & pertinent test results, reviewed documented beta blocker date and time   Airway Mallampati: II  TM Distance: >3 FB Neck ROM: full    Dental no notable dental hx.    Pulmonary sleep apnea (mild) ,    Pulmonary exam normal breath sounds clear to auscultation       Cardiovascular Exercise Tolerance: Good hypertension, + CAD and + Peripheral Vascular Disease  + dysrhythmias Atrial Fibrillation  Rhythm:regular Rate:Normal     Neuro/Psych negative neurological ROS  negative psych ROS   GI/Hepatic negative GI ROS, Neg liver ROS,   Endo/Other  diabetes, Type 2  Renal/GU CRFRenal disease  negative genitourinary   Musculoskeletal   Abdominal   Peds  Hematology negative hematology ROS (+)   Anesthesia Other Findings   Reproductive/Obstetrics negative OB ROS                             Anesthesia Physical  Anesthesia Plan  ASA: II  Anesthesia Plan: MAC   Post-op Pain Management:    Induction:   PONV Risk Score and Plan:   Airway Management Planned:   Additional Equipment:   Intra-op Plan:   Post-operative Plan:   Informed Consent: I have reviewed the patients History and Physical, chart, labs and discussed the procedure including the risks, benefits and alternatives for the proposed anesthesia with the patient or authorized representative who has indicated his/her understanding and acceptance.   Dental Advisory Given  Plan Discussed with: CRNA  Anesthesia Plan Comments:         Anesthesia Quick Evaluation

## 2017-08-18 NOTE — Anesthesia Procedure Notes (Signed)
Procedure Name: MAC Date/Time: 08/18/2017 7:23 AM Performed by: Janna Arch, CRNA Pre-anesthesia Checklist: Patient identified, Emergency Drugs available, Suction available and Patient being monitored Patient Re-evaluated:Patient Re-evaluated prior to induction Oxygen Delivery Method: Nasal cannula

## 2017-08-20 ENCOUNTER — Encounter: Payer: Self-pay | Admitting: Family Medicine

## 2017-08-20 DIAGNOSIS — I482 Chronic atrial fibrillation, unspecified: Secondary | ICD-10-CM

## 2017-08-20 MED ORDER — METOPROLOL TARTRATE 50 MG PO TABS: 50.0000 mg | ORAL_TABLET | Freq: Two times a day (BID) | ORAL | 3 refills | Status: DC

## 2017-08-20 MED ORDER — LISINOPRIL 40 MG PO TABS: 40.0000 mg | ORAL_TABLET | Freq: Every day | ORAL | 3 refills | Status: DC

## 2017-08-20 MED ORDER — ROSUVASTATIN CALCIUM 40 MG PO TABS: 40.0000 mg | ORAL_TABLET | Freq: Every day | ORAL | 2 refills | Status: DC

## 2017-09-01 ENCOUNTER — Ambulatory Visit: Payer: PPO | Admitting: Podiatry

## 2017-09-01 ENCOUNTER — Encounter: Payer: Self-pay | Admitting: Podiatry

## 2017-09-01 DIAGNOSIS — M79676 Pain in unspecified toe(s): Secondary | ICD-10-CM | POA: Diagnosis not present

## 2017-09-01 DIAGNOSIS — Q828 Other specified congenital malformations of skin: Secondary | ICD-10-CM | POA: Diagnosis not present

## 2017-09-01 DIAGNOSIS — B351 Tinea unguium: Secondary | ICD-10-CM

## 2017-09-01 DIAGNOSIS — L089 Local infection of the skin and subcutaneous tissue, unspecified: Secondary | ICD-10-CM

## 2017-09-01 DIAGNOSIS — E1151 Type 2 diabetes mellitus with diabetic peripheral angiopathy without gangrene: Secondary | ICD-10-CM

## 2017-09-01 DIAGNOSIS — E1142 Type 2 diabetes mellitus with diabetic polyneuropathy: Secondary | ICD-10-CM

## 2017-09-01 DIAGNOSIS — S90821A Blister (nonthermal), right foot, initial encounter: Secondary | ICD-10-CM | POA: Diagnosis not present

## 2017-09-01 MED ORDER — DOXYCYCLINE HYCLATE 100 MG PO TABS: 100.0000 mg | ORAL_TABLET | Freq: Two times a day (BID) | ORAL | 0 refills | Status: DC

## 2017-09-01 NOTE — Progress Notes (Signed)
Patient ID: Eric Rendell., male   DOB: 29-Dec-1927, 84 y.o.   MRN: [id number] Complaint:  Visit Type: Patient returns to my office for continued preventative foot care services. Complaint: Patient states" my nails have grown long and thick and become painful to walk and wear shoes" Patient has been diagnosed with DM with neuropathy.. The patient presents for preventative foot care services. No changes to ROS.  He has painful callus under the outside ball of right foot greater than his left foot.   Podiatric Exam: Vascular: dorsalis pedis and posterior tibial pulses are mildly palpable   bilateral. Capillary return is immediate. Temperature gradient is WNL. Skin turgor WNL  Sensorium: Absent  Semmes Weinstein monofilament test. Absent  tactile sensation bilaterally. Nail Exam: Pt has thick disfigured discolored nails with subungual debris noted bilateral entire nail hallux through fifth toenails  Orthopedic Exam: Muscle tone and strength are WNL. No limitations in general ROM. No crepitus or effusions noted. Foot type and digits show no abnormalities.  Skin:   There is porokeratotic lesion sub 5th met B/L.  there are 2 blood-filled blisters noted distal to the poor keratoses sub-fifth right foot and proximal to the fifth porokeratosis right foot..  These 2 blisters do not seem to be communicating.  The proximal blister has broken and drained and the distal blister has fluctuance and needs to be drained.  Diagnosis:  Onychomycosis, , Pain in right toe, pain in left toes,  Porokeratosis sub 5th met B/L. Infected blister right foot.  Treatment & Plan Procedures and Treatment: Consent by patient was obtained for treatment procedures. The patient understood the discussion of treatment and procedures well. All questions were answered thoroughly reviewed. Debridement of mycotic and hypertrophic toenails, 1 through 5 bilateral and clearing of subungual debris. No ulceration, no infection noted.   Debridement of porokeratosis   incision and drainage of fluctuant blister right foot.  The distal  was mostly blood-filled but there was some coagulation noted in the drainage.. This blister was bandaged with Neosporin and a dry sterile dressing.  . Home instructions were given to this patient for soaks.  Prescribed antibiotics to be taken.  Return to the clinic in one week.   Return Visit-Office Procedure: Patient instructed to return to the office for a follow up visit 10 weeks. for continued evaluation and treatment.     Gardiner Barefoot DPM

## 2017-09-02 ENCOUNTER — Encounter: Payer: Self-pay | Admitting: Family Medicine

## 2017-09-02 ENCOUNTER — Other Ambulatory Visit: Payer: Self-pay | Admitting: Family Medicine

## 2017-09-02 DIAGNOSIS — E781 Pure hyperglyceridemia: Secondary | ICD-10-CM

## 2017-09-02 DIAGNOSIS — E0940 Drug or chemical induced diabetes mellitus with neurological complications with diabetic neuropathy, unspecified: Secondary | ICD-10-CM

## 2017-09-02 DIAGNOSIS — E1151 Type 2 diabetes mellitus with diabetic peripheral angiopathy without gangrene: Secondary | ICD-10-CM

## 2017-09-03 ENCOUNTER — Other Ambulatory Visit: Payer: Self-pay | Admitting: Family Medicine

## 2017-09-03 DIAGNOSIS — E781 Pure hyperglyceridemia: Secondary | ICD-10-CM

## 2017-09-03 DIAGNOSIS — Z79899 Other long term (current) drug therapy: Secondary | ICD-10-CM

## 2017-09-03 DIAGNOSIS — E0940 Drug or chemical induced diabetes mellitus with neurological complications with diabetic neuropathy, unspecified: Secondary | ICD-10-CM

## 2017-09-07 ENCOUNTER — Other Ambulatory Visit: Payer: PPO

## 2017-09-07 DIAGNOSIS — E781 Pure hyperglyceridemia: Secondary | ICD-10-CM | POA: Diagnosis not present

## 2017-09-07 DIAGNOSIS — E1151 Type 2 diabetes mellitus with diabetic peripheral angiopathy without gangrene: Secondary | ICD-10-CM | POA: Diagnosis not present

## 2017-09-08 ENCOUNTER — Ambulatory Visit: Payer: PPO | Admitting: Podiatry

## 2017-09-08 ENCOUNTER — Encounter: Payer: Self-pay | Admitting: Podiatry

## 2017-09-08 DIAGNOSIS — L089 Local infection of the skin and subcutaneous tissue, unspecified: Secondary | ICD-10-CM | POA: Diagnosis not present

## 2017-09-08 DIAGNOSIS — S90821A Blister (nonthermal), right foot, initial encounter: Secondary | ICD-10-CM

## 2017-09-08 DIAGNOSIS — D689 Coagulation defect, unspecified: Secondary | ICD-10-CM

## 2017-09-08 DIAGNOSIS — E1142 Type 2 diabetes mellitus with diabetic polyneuropathy: Secondary | ICD-10-CM

## 2017-09-08 LAB — LIPID PANEL
CHOL/HDL RATIO: 4.9 (calc) (ref <5.0)
CHOLESTEROL: 103 mg/dL (ref <200)
HDL: 21 mg/dL — AB (ref >40)
LDL CHOLESTEROL (CALC): 61 mg/dL
Non-HDL Cholesterol (Calc): 82 mg/dL (calc) (ref <130)
Triglycerides: 128 mg/dL (ref <150)

## 2017-09-08 LAB — HEMOGLOBIN A1C
HEMOGLOBIN A1C: 6 %{Hb} — AB (ref <5.7)
Mean Plasma Glucose: 126 (calc)
eAG (mmol/L): 7 (calc)

## 2017-09-08 NOTE — Progress Notes (Signed)
This patient presents the office follow-up for an evaluation of an infected blister on his right foot.  He says that his foot is better but there still is bleeding and drainage on the bandages.  He says he has been taking his antibiotics as prescribed.  He is walking with diabetic insoles in his shoes.  . He states that there is not much discomfort, but he knows there still needs to be more healing at the site of the blister.  [name] He presents the office today for continued evaluation and treatment of his previously infected blister.   General Appearance  Alert, conversant and in no acute stress.  Vascular  Dorsalis pedis and posterior pulses are mildly  palpable  bilaterally.  Capillary return is within normal limits  bilaterally. Temperature is within normal limits  Bilaterally.  Neurologic  Senn-Weinstein monofilament wire test absent  bilaterally. Muscle power within normal limits bilaterally.  Nails Thick disfigured discolored nails with subungual debris bilaterally from hallux to fifth toes bilaterally. No evidence of bacterial infection or drainage bilaterally.  Orthopedic  No limitations of motion of motion feet bilaterally.  No crepitus or effusions noted.  No bony pathology or digital deformities noted.  Skin  normotropic skin with no porokeratosis noted bilaterally.  No signs of infections or ulcers noted.  He blisters under the fifth metatarsal head on the right foot have healed and no evidence of any fluctuance is noted.  There is no evidence of any redness or streaking noted.  There is blackened necrotic tissue noted at the site of the blister/ulcer.    S/P infected blister right foot.   ROV.  Patient was told to continue taking his antibiotics until completed.  He is to continue soaks and bandages on his right foot until the drainage stopped.  He is to return to the office in 2 weeks for continued evaluation and treatment.  Dispersion padding was applied to his diabetic insole in an  effort to off weight bear his fifth metatarsal head, right foot.   Gardiner Barefoot DPM

## 2017-09-10 ENCOUNTER — Encounter: Payer: Self-pay | Admitting: Family Medicine

## 2017-09-10 MED ORDER — FUROSEMIDE 20 MG PO TABS: 20.0000 mg | ORAL_TABLET | Freq: Every day | ORAL | 3 refills | Status: DC

## 2017-09-24 ENCOUNTER — Encounter: Payer: Self-pay | Admitting: Podiatry

## 2017-09-24 ENCOUNTER — Ambulatory Visit: Payer: PPO | Admitting: Podiatry

## 2017-09-24 DIAGNOSIS — L089 Local infection of the skin and subcutaneous tissue, unspecified: Secondary | ICD-10-CM

## 2017-09-24 DIAGNOSIS — D689 Coagulation defect, unspecified: Secondary | ICD-10-CM

## 2017-09-24 DIAGNOSIS — E1142 Type 2 diabetes mellitus with diabetic polyneuropathy: Secondary | ICD-10-CM | POA: Diagnosis not present

## 2017-09-24 DIAGNOSIS — S90821A Blister (nonthermal), right foot, initial encounter: Secondary | ICD-10-CM

## 2017-09-24 NOTE — Progress Notes (Signed)
This patient presents the office follow-up for an evaluation of an infected blister on his right foot.  He says that his foot is better and he is still wearing a bandage.  He says there is no pain or drainage from the blister site.  He says he is having no problems with the dispersion padding added to his insole.  He presents the office today for continued evaluation and treatment of this blister right foot   General Appearance  Alert, conversant and in no acute stress.  Vascular  Dorsalis pedis and posterior pulses are mildly  palpable  bilaterally.  Capillary return is within normal limits  bilaterally. Temperature is within normal limits  Bilaterally.  Neurologic  Senn-Weinstein monofilament wire test absent  bilaterally. Muscle power within normal limits bilaterally.  Nails Thick disfigured discolored nails with subungual debris bilaterally from hallux to fifth toes bilaterally. No evidence of bacterial infection or drainage bilaterally.  Orthopedic  No limitations of motion of motion feet bilaterally.  No crepitus or effusions noted.  No bony pathology or digital deformities noted.  Skin  normotropic skin with no porokeratosis noted bilaterally.  No signs of infections or ulcers noted.  The  blister under the fifth metatarsal head on the right foot has  healed and no evidence of any fluctuance is noted.  There is no evidence of any redness or streaking noted.  Hard crust is covering the forefoot sub 5th in the absence of infection or drainage.    S/P infected blister right foot.   ROV.  Patient was told to discontinue any soaks or bandaging since there is no evidence of any drainage.  I chose to leave the crusted cover at the site of the blister since it will serve as a biological cover to the blister site.  He is to continue to wear the dispersion pad on his orthotic. In his shoe  . He was told to return to the office as needed for treatment of the blister.  He was also told to return to the  office before preventative foot care services as needed   Gardiner Barefoot DPM

## 2017-09-27 DIAGNOSIS — Z961 Presence of intraocular lens: Secondary | ICD-10-CM | POA: Diagnosis not present

## 2017-09-29 ENCOUNTER — Encounter (HOSPITAL_COMMUNITY): Payer: Self-pay | Admitting: Emergency Medicine

## 2017-09-29 ENCOUNTER — Emergency Department (HOSPITAL_COMMUNITY): Payer: PPO

## 2017-09-29 ENCOUNTER — Other Ambulatory Visit: Payer: Self-pay

## 2017-09-29 ENCOUNTER — Emergency Department (HOSPITAL_COMMUNITY)
Admission: EM | Admit: 2017-09-29 | Discharge: 2017-09-29 | Disposition: A | Discharge status: Home / Self Care | Payer: PPO | Attending: Emergency Medicine | Admitting: Emergency Medicine

## 2017-09-29 DIAGNOSIS — Z79899 Other long term (current) drug therapy: Secondary | ICD-10-CM | POA: Diagnosis not present

## 2017-09-29 DIAGNOSIS — Z951 Presence of aortocoronary bypass graft: Secondary | ICD-10-CM | POA: Diagnosis not present

## 2017-09-29 DIAGNOSIS — I2510 Atherosclerotic heart disease of native coronary artery without angina pectoris: Secondary | ICD-10-CM | POA: Diagnosis not present

## 2017-09-29 DIAGNOSIS — M545 Low back pain: Secondary | ICD-10-CM | POA: Diagnosis not present

## 2017-09-29 DIAGNOSIS — M5431 Sciatica, right side: Secondary | ICD-10-CM | POA: Insufficient documentation

## 2017-09-29 DIAGNOSIS — N183 Chronic kidney disease, stage 3 (moderate): Secondary | ICD-10-CM | POA: Insufficient documentation

## 2017-09-29 DIAGNOSIS — M5441 Lumbago with sciatica, right side: Secondary | ICD-10-CM | POA: Diagnosis not present

## 2017-09-29 DIAGNOSIS — Z7901 Long term (current) use of anticoagulants: Secondary | ICD-10-CM | POA: Insufficient documentation

## 2017-09-29 DIAGNOSIS — E1122 Type 2 diabetes mellitus with diabetic chronic kidney disease: Secondary | ICD-10-CM | POA: Diagnosis not present

## 2017-09-29 DIAGNOSIS — I129 Hypertensive chronic kidney disease with stage 1 through stage 4 chronic kidney disease, or unspecified chronic kidney disease: Secondary | ICD-10-CM | POA: Diagnosis not present

## 2017-09-29 LAB — URINALYSIS, ROUTINE W REFLEX MICROSCOPIC
BACTERIA UA: NONE SEEN
Bilirubin Urine: NEGATIVE
GLUCOSE, UA: NEGATIVE mg/dL
HGB URINE DIPSTICK: NEGATIVE
Ketones, ur: NEGATIVE mg/dL
NITRITE: NEGATIVE
PH: 6 (ref 5.0–8.0)
Protein, ur: 30 mg/dL — AB
SPECIFIC GRAVITY, URINE: 1.014 (ref 1.005–1.030)
Squamous Epithelial / LPF: NONE SEEN

## 2017-09-29 MED ORDER — TRAMADOL HCL 50 MG PO TABS
50.0000 mg | ORAL_TABLET | Freq: Once | ORAL | Status: AC
  Administered 2017-09-29: 50 mg via ORAL
  Filled 2017-09-29: qty 1

## 2017-09-29 MED ORDER — DEXAMETHASONE SODIUM PHOSPHATE 10 MG/ML IJ SOLN
10.0000 mg | Freq: Once | INTRAMUSCULAR | Status: AC
  Administered 2017-09-29: 10 mg via INTRAMUSCULAR
  Filled 2017-09-29: qty 1

## 2017-09-29 MED ORDER — TRAMADOL HCL 50 MG PO TABS: 50.0000 mg | ORAL_TABLET | Freq: Four times a day (QID) | ORAL | 0 refills | Status: DC | PRN

## 2017-09-29 NOTE — ED Triage Notes (Signed)
Back pain for the last 2 weeks   Has been able to get around until last night now "when I walk it is like I got a couple of sticks up there"

## 2017-09-29 NOTE — Discharge Instructions (Signed)
Do not drive within 4 hours of taking tramadol as this will make you drowsy.  Avoid lifting,  Bending,  Twisting or any other activity that worsens your pain over the next week.  Apply a heating pad to your lower back for 15 minutes several times daily.  You should get rechecked if your symptoms are not better over the next 5 days,  Or you develop increased pain,  Weakness in your leg(s) or loss of bladder or bowel function - these are symptoms of a worse injury.

## 2017-09-29 NOTE — ED Provider Notes (Signed)
[facility] EMERGENCY DEPARTMENT Provider Note   CSN: [id number] Arrival date & time: 09/29/17  1102     History   Chief Complaint Chief Complaint  Patient presents with  . Back Pain    radiates to Leg    HPI Eric [name]. is a 84 y.o. male with a history as outlined below, pertinent for atrial fibrillation on Xarelto, CAD, chronic stage III kidney disease and distant history of kidney stones presenting with right lower back pain which radiates into his lateral distal thigh and has been present since last night.  He denies any falls or injury and denies previous history of problems with his back.  He took Tylenol yesterday evening and was able to sleep so feels it may have been helpful but his pain has returned today.  He denies abdominal pain, distention, nausea, vomiting or fevers.  He also denies dysuria or hematuria, but reports a long-standing history of a weakened urine stream.  He denies weakness or numbness in his extremities.  The history is provided by the patient, the spouse and a relative.    Past Medical History:  Diagnosis Date  . Atrial fibrillation ([place])   . Atrial flutter ([place])    s/p RFCA  . CAD (coronary artery disease)    cath 2003, occluded S-RCA, occluded S-Dx, L-LAD ok, s/p PTCA to LAD  . Cataract   . CKD (chronic kidney disease) stage 3, GFR 30-59 ml/min (HCC)   . Diabetes mellitus    diet controlled  . Hearing aid worn    bilateral  . Long term (current) use of anticoagulants   . Neuromuscular disorder ([place])   . OSA (obstructive sleep apnea) 12/11   very mild, AHI 7/hr  . Persistent atrial fibrillation ([place]) 09/26/2015  . Pure hyperglyceridemia   . PVD (peripheral vascular disease) ([place])    angioplasty of his right lower extremity in [place] by Dr.[name] 2013  . Unspecified essential hypertension   . Wears dentures    full upper and lower    Patient Active Problem List   Diagnosis Date Noted  . Chronic anticoagulation 09/26/2015  .  Persistent atrial fibrillation ([place]) 09/26/2015  . Olecranon bursitis 04/23/2014  . Preop cardiovascular exam 04/13/2013  . Routine general medical examination at a health care facility 10/10/2012  . PVD (peripheral vascular disease) ([place]) 06/02/2012  . Diabetes mellitus type 2 with peripheral artery disease ([place]) 05/17/2012  . Microhematuria 11/16/2011  . BPH (benign prostatic hyperplasia) 11/16/2011  . Chronic kidney disease, stage III (moderate) ([place]) 08/26/2011  . Leg pain 08/26/2011  . Coronary artery disease 03/25/2011  . Long term (current) use of anticoagulants 12/23/2010  . OBSTRUCTIVE SLEEP APNEA 09/15/2010  . PERIODIC LIMB MOVEMENT DISORDER 09/15/2010  . SLEEP APNEA 08/26/2010  . PERSONAL HISTORY OF COLONIC POLYPS 10/25/2009  . VITAMIN B12 DEFICIENCY 03/04/2009  . Diabetes mellitus with neuropathy ([place]) 11/24/2006  . HYPERTRIGLYCERIDEMIA 11/24/2006  . Essential hypertension 11/24/2006    Past Surgical History:  Procedure Laterality Date  . Adenosine Myoview  3/06   EF 56%, neg. Ischemia  . Adenosine Myoview  02/18/07   nml  . ANGIOPLASTY  1/99   CAD- diogonal with rotational artherectomy  . Arthrectomy     of LAD & PTCA  . BLEPHAROPLASTY Bilateral   . CARDIAC CATHETERIZATION  1/00  . CARDIOVERSION  1/04  . CARDIOVERSION  5/07   hospital- a flutter  . CARPAL TUNNEL RELEASE     ? bilateral  . CATARACT EXTRACTION  W/PHACO Right 07/28/2017   Procedure: CATARACT EXTRACTION PHACO AND INTRAOCULAR LENS PLACEMENT ([place]) RIGHT DIABETIC;  Surgeon: [name], MD;  Location: [place];  Service: Ophthalmology;  Laterality: Right;  Diabetic - diet controlled  . CATARACT EXTRACTION W/PHACO Left 08/18/2017   Procedure: CATARACT EXTRACTION PHACO AND INTRAOCULAR LENS PLACEMENT (IOC);  Surgeon: [name], MD;  Location: [place];  Service: Ophthalmology;  Laterality: Left;  DIABETES - oral meds  . COLONOSCOPY W/ BIOPSIES  10/01/06   sigmoid  polyp bx neg, 3 years  . CORONARY ANGIOPLASTY  4/03   cutting balloon PTCA pLAD into Diag  . CORONARY ARTERY BYPASS GRAFT  2000   LIMA-LAD, SVG-RCA, SVG-Diag; SVG-Diag & SVG-RCA occluded 2003  . FRACTURE SURGERY    . HAND SURGERY  08/27/09   R thumb procedure wit Scaphoid Gragt and screws, Dr [name]  . NM MYOVIEW LTD  4/11   normal  . PERIPHERAL VASCULAR CATHETERIZATION Right 06/27/2015   Procedure: Lower Extremity Angiography;  Surgeon: Algernon [name], MD;  Location: [place] [facility];  Service: Cardiovascular;  Laterality: Right;  . PERIPHERAL VASCULAR CATHETERIZATION  06/27/2015   Procedure: Lower Extremity Intervention;  Surgeon: Algernon [name], MD;  Location: [place] [facility];  Service: Cardiovascular;;       Home Medications    Prior to Admission medications   Medication Sig Start Date End Date Taking? Authorizing Provider  acetaminophen (TYLENOL) 500 MG tablet Take 1,000 mg by mouth every 8 (eight) hours as needed for mild pain.     [provider]  B Complex Vitamins (B COMPLEX-B12) TABS Take 1 tablet by mouth daily.     [provider]  Blood Glucose Monitoring Suppl (ONE TOUCH ULTRA SYSTEM KIT) W/DEVICE KIT 1 kit by Does not apply route once. 11/01/14   [name], MD  cholecalciferol (VITAMIN D) 1000 UNITS tablet Take 1,000 Units by mouth 2 (two) times a week. Takes on Mon and Fri    [provider]  CINNAMON PO Take by mouth 2 (two) times daily.     [provider]  doxycycline (VIBRA-TABS) 100 MG tablet Take 1 tablet (100 mg total) by mouth 2 (two) times daily. 09/01/17   Gardiner Barefoot, DPM  ELIQUIS 2.5 MG TABS tablet Take 1 tablet by mouth twice a day 04/14/17   Lelon Perla, MD  fenofibrate 160 MG tablet Take 1 tablet (160 mg total) by mouth daily. 04/23/17   [name], MD  fish oil-omega-3 fatty acids 1000 MG capsule Take 3 g by mouth daily.     [provider]  furosemide (LASIX) 20 MG tablet Take 1 tablet  (20 mg total) by mouth daily. 09/10/17   [name], MD  glucose blood test strip Use as instructed 05/26/17   [name], MD  lisinopril (PRINIVIL,ZESTRIL) 40 MG tablet Take 1 tablet (40 mg total) by mouth daily. 08/20/17   [name], MD  metoprolol tartrate (LOPRESSOR) 50 MG tablet Take 1 tablet (50 mg total) by mouth 2 (two) times daily. 08/20/17   [name], MD  mometasone (ELOCON) 0.1 % cream Apply 1 application topically daily. 06/30/17   [name], MD  Multiple Vitamin (DAILY MULTIVITAMIN PO) Take 1 tablet by mouth daily.      [provider]  [facility] DELICA LANCETS FINE MISC check sugar once daily 08/17/17   [name], MD  rosuvastatin (CRESTOR) 40 MG tablet Take 1 tablet (40  mg total) by mouth daily. 08/20/17   [name], MD  traMADol (ULTRAM) 50 MG tablet Take 1 tablet (50 mg total) by mouth every 6 (six) hours as needed. 09/29/17   Evalee Jefferson, PA-C    Family History Family History  Problem Relation Age of Onset  . Stroke Father   . Aneurysm Father   . Hypertension Father   . Prostate cancer Brother   . Hypertension Mother   . Lung cancer Brother        smoker  . Thrombosis Brother   . Other Brother        RF valve disorder (smoker)  . Lung cancer Brother        smoker  . Breast cancer Sister   . Liver cancer Brother   . Other Sister        cerebral hemorrhage    Social History Social History   Tobacco Use  . Smoking status: Never Smoker  . Smokeless tobacco: Former Network engineer Use Topics  . Alcohol use: No  . Drug use: No     Allergies   Isosorbide mononitrate; Ramipril; and Quinidine gluconate   Review of Systems Review of Systems  Constitutional: Negative for fever.  Respiratory: Negative for shortness of breath.   Cardiovascular: Negative for chest pain and leg swelling.  Gastrointestinal: Negative for abdominal distention, abdominal pain and constipation.  Genitourinary: Positive for  difficulty urinating (chronic). Negative for dysuria, flank pain, frequency and urgency.       (chronic)  Musculoskeletal: Positive for back pain. Negative for gait problem and joint swelling.  Skin: Negative for rash.  Neurological: Negative for weakness and numbness.     Physical Exam Updated Vital Signs BP (!) 167/76   Pulse (!) 49   Temp (!) 96.8 F (36 C) (Oral)   Resp 17   Ht 5' 5.5" (1.664 m)   Wt 81.6 kg (180 lb)   SpO2 100%   BMI 29.50 kg/m   Physical Exam  Constitutional: He appears well-developed and well-nourished.  HENT:  Head: Normocephalic.  Eyes: Conjunctivae are normal.  Neck: Normal range of motion. Neck supple.  Cardiovascular: Normal rate and intact distal pulses.  Pedal pulses normal.  Pulmonary/Chest: Effort normal.  Abdominal: Soft. Bowel sounds are normal. He exhibits no distension and no mass.  Musculoskeletal: Normal range of motion. He exhibits no edema.       Lumbar back: He exhibits tenderness. He exhibits no bony tenderness, no swelling, no edema and no spasm.       Back:  Positive straight leg raise right.  Neurological: He is alert. He has normal strength. He displays no atrophy and no tremor. No sensory deficit. Gait normal.  Reflex Scores:      Patellar reflexes are 2+ on the right side and 2+ on the left side. No strength deficit noted in hip and knee flexor and extensor muscle groups.  Ankle flexion and extension intact.  Skin: Skin is warm and dry.  Psychiatric: He has a normal mood and affect.  Nursing note and vitals reviewed.    ED Treatments / Results  Labs (all labs ordered are listed, but only abnormal results are displayed) Labs Reviewed  URINALYSIS, ROUTINE W REFLEX MICROSCOPIC - Abnormal; Notable for the following components:      Result Value   Protein, ur 30 (*)    Leukocytes, UA TRACE (*)    All other components within normal limits    EKG  EKG Interpretation None  Radiology Dg Lumbar Spine  Complete  Result Date: 09/29/2017 CLINICAL DATA:  Low back pain EXAM: LUMBAR SPINE - COMPLETE 4+ VIEW COMPARISON:  None. FINDINGS: No fracture. Trace anterolisthesis of L5 on S1 associated with mild loss of disc height. Lower lumbar facet degeneration evident. SI joints unremarkable. Atherosclerotic calcification noted in the wall of the abdominal aorta with cholelithiasis again noted. IMPRESSION: 1. Mild spondylolisthesis at L5-S1. 2. Cholelithiasis. 3.  Aortic Atherosclerois (ICD10-170.0) Electronically Signed   By: Misty Stanley M.D.   On: 09/29/2017 12:53    Procedures Procedures (including critical care time)  Medications Ordered in ED Medications  traMADol (ULTRAM) tablet 50 mg (50 mg Oral Given 09/29/17 1230)  dexamethasone (DECADRON) injection 10 mg (10 mg Intramuscular Given 09/29/17 1341)     Initial Impression / Assessment and Plan / ED Course  I have reviewed the triage vital signs and the nursing notes.  Pertinent labs & imaging results that were available during my care of the patient were reviewed by me and considered in my medical decision making (see chart for details).     Imaging and labs reviewed and discussed with patient and family.  He was given tramadol here along with a Decadron injection and he had significant improvement in his symptoms.  He was prescribed a small quantity of tramadol and caution regarding possible sedation.  Also discussed heat therapy.  Close watch of his CBGs for the next couple of days given the steroid injection.  He states his blood sugars run in the 100-120 range.  Advised follow-up with his PCP for any persistent or worsening symptoms.  Of note patient had bradycardia during this visit however review of the chart reveals that this is actually a chronic finding for him.  He is asymptomatic without complaint of dizziness or weakness.  Patient was seen by Dr. Gilford Raid during this ED visit.  Final Clinical Impressions(s) / ED Diagnoses   Final  diagnoses:  Sciatica of right side    ED Discharge Orders        Ordered    traMADol (ULTRAM) 50 MG tablet  Every 6 hours PRN     09/29/17 1433       Evalee Jefferson, PA-C 09/29/17 1746    Isla Pence, MD 10/01/17 [phone]

## 2017-10-11 ENCOUNTER — Ambulatory Visit (INDEPENDENT_AMBULATORY_CARE_PROVIDER_SITE_OTHER): Payer: PPO | Admitting: Family Medicine

## 2017-10-11 VITALS — BP 172/68 | HR 66 | Temp 97.6°F | Wt 178.0 lb

## 2017-10-11 DIAGNOSIS — N183 Chronic kidney disease, stage 3 unspecified: Secondary | ICD-10-CM

## 2017-10-11 DIAGNOSIS — E119 Type 2 diabetes mellitus without complications: Secondary | ICD-10-CM | POA: Diagnosis not present

## 2017-10-11 DIAGNOSIS — I482 Chronic atrial fibrillation, unspecified: Secondary | ICD-10-CM

## 2017-10-11 DIAGNOSIS — E7800 Pure hypercholesterolemia, unspecified: Secondary | ICD-10-CM

## 2017-10-11 DIAGNOSIS — I739 Peripheral vascular disease, unspecified: Secondary | ICD-10-CM

## 2017-10-11 DIAGNOSIS — I10 Essential (primary) hypertension: Secondary | ICD-10-CM | POA: Diagnosis not present

## 2017-10-11 MED ORDER — AMLODIPINE BESYLATE 10 MG PO TABS: 10.0000 mg | ORAL_TABLET | Freq: Every day | ORAL | 3 refills | Status: DC

## 2017-10-11 NOTE — Progress Notes (Signed)
Subjective:    Patient ID: Eric Hough., male    DOB: [birth year], 84 y.o.   MRN: [id number]  HPI   Here for follow up.  Blood pressure recently at home has been elevated.  Blood pressure averages between 160 and 200/70-90.  Here today his blood pressure is 172/68.  He states that he is compliant with his lisinopril and his metoprolol.  He denies any chest pain shortness of breath or dyspnea on exertion.  Overall he states that he feels well.  He does have neuropathy in both feet and diminished sensation.  On the plantar surface of his right fifth MTP joint, there is a pre-ulcerative callus roughly the size of a nickel.  There is no skin breakdown.  There is some bruising within the callus.  He denies any myalgias or right upper quadrant pain.  He denies any orthopnea or paroxysmal nocturnal dyspnea.  Immunizations are up-to-date.  Past Medical History:  Diagnosis Date  . Atrial fibrillation ([place])   . Atrial flutter ([place])    s/p RFCA  . CAD (coronary artery disease)    cath 2003, occluded S-RCA, occluded S-Dx, L-LAD ok, s/p PTCA to LAD  . Cataract   . CKD (chronic kidney disease) stage 3, GFR 30-59 ml/min (HCC)   . Diabetes mellitus    diet controlled  . Hearing aid worn    bilateral  . Long term (current) use of anticoagulants   . Neuromuscular disorder ([place])   . OSA (obstructive sleep apnea) 12/11   very mild, AHI 7/hr  . Persistent atrial fibrillation ([place]) 09/26/2015  . Pure hyperglyceridemia   . PVD (peripheral vascular disease) ([place])    angioplasty of his right lower extremity in [place] by Dr.[name] 2013  . Unspecified essential hypertension   . Wears dentures    full upper and lower   Past Surgical History:  Procedure Laterality Date  . Adenosine Myoview  3/06   EF 56%, neg. Ischemia  . Adenosine Myoview  02/18/07   nml  . ANGIOPLASTY  1/99   CAD- diogonal with rotational artherectomy  . Arthrectomy     of LAD & PTCA  . BLEPHAROPLASTY Bilateral   . CARDIAC  CATHETERIZATION  1/00  . CARDIOVERSION  1/04  . CARDIOVERSION  5/07   hospital- a flutter  . CARPAL TUNNEL RELEASE     ? bilateral  . CATARACT EXTRACTION W/PHACO Right 07/28/2017   Procedure: CATARACT EXTRACTION PHACO AND INTRAOCULAR LENS PLACEMENT ([place]) RIGHT DIABETIC;  Surgeon: [name], MD;  Location: [place];  Service: Ophthalmology;  Laterality: Right;  Diabetic - diet controlled  . CATARACT EXTRACTION W/PHACO Left 08/18/2017   Procedure: CATARACT EXTRACTION PHACO AND INTRAOCULAR LENS PLACEMENT ([facility]);  Surgeon: [name], MD;  Location: [place];  Service: Ophthalmology;  Laterality: Left;  DIABETES - oral meds  . COLONOSCOPY W/ BIOPSIES  10/01/06   sigmoid polyp bx neg, 3 years  . CORONARY ANGIOPLASTY  4/03   cutting balloon PTCA pLAD into Diag  . CORONARY ARTERY BYPASS GRAFT  2000   LIMA-LAD, SVG-RCA, SVG-Diag; SVG-Diag & SVG-RCA occluded 2003  . FRACTURE SURGERY    . HAND SURGERY  08/27/09   R thumb procedure wit Scaphoid Gragt and screws, Dr [name]  . NM MYOVIEW LTD  4/11   normal  . PERIPHERAL VASCULAR CATHETERIZATION Right 06/27/2015   Procedure: Lower Extremity Angiography;  Surgeon: Algernon [name], MD;  Location: [place] [facility];  Service: Cardiovascular;  Laterality: Right;  .  PERIPHERAL VASCULAR CATHETERIZATION  06/27/2015   Procedure: Lower Extremity Intervention;  Surgeon: Algernon [name], MD;  Location: [place] [facility];  Service: Cardiovascular;;   Current Outpatient Medications on File Prior to Visit  Medication Sig Dispense Refill  . acetaminophen (TYLENOL) 500 MG tablet Take 1,000 mg by mouth every 8 (eight) hours as needed for mild pain.     . B Complex Vitamins (B COMPLEX-B12) TABS Take 1 tablet by mouth daily.     . Blood Glucose Monitoring Suppl (ONE TOUCH ULTRA SYSTEM KIT) W/DEVICE KIT 1 kit by Does not apply route once. 1 each 0  . cholecalciferol (VITAMIN D) 1000 UNITS tablet Take 1,000 Units by mouth 2 (two) times  a week. Takes on Mon and Fri    . CINNAMON PO Take by mouth 2 (two) times daily.     [name] doxycycline (VIBRA-TABS) 100 MG tablet Take 1 tablet (100 mg total) by mouth 2 (two) times daily. 20 tablet 0  . ELIQUIS 2.5 MG TABS tablet Take 1 tablet by mouth twice a day 180 tablet 1  . fenofibrate 160 MG tablet Take 1 tablet (160 mg total) by mouth daily. 90 tablet 2  . fish oil-omega-3 fatty acids 1000 MG capsule Take 3 g by mouth daily.     . furosemide (LASIX) 20 MG tablet Take 1 tablet (20 mg total) by mouth daily. 90 tablet 3  . glucose blood test strip Use as instructed 100 each 3  . lisinopril (PRINIVIL,ZESTRIL) 40 MG tablet Take 1 tablet (40 mg total) by mouth daily. 90 tablet 3  . metoprolol tartrate (LOPRESSOR) 50 MG tablet Take 1 tablet (50 mg total) by mouth 2 (two) times daily. 180 tablet 3  . mometasone (ELOCON) 0.1 % cream Apply 1 application topically daily. 45 g 0  . Multiple Vitamin (DAILY MULTIVITAMIN PO) Take 1 tablet by mouth daily.      [name] DELICA LANCETS FINE MISC check sugar once daily 100 each 2  . rosuvastatin (CRESTOR) 40 MG tablet Take 1 tablet (40 mg total) by mouth daily. 90 tablet 2  . traMADol (ULTRAM) 50 MG tablet Take 1 tablet (50 mg total) by mouth every 6 (six) hours as needed. 20 tablet 0   No current facility-administered medications on file prior to visit.    Allergies  Allergen Reactions  . Isosorbide Mononitrate Other (See Comments)    Unknown, doesn't remember   . Ramipril Cough  . Quinidine Gluconate Rash   Social History   Socioeconomic History  . Marital status: Married    Spouse name: Not on file  . Number of children: 5  . Years of education: Not on file  . Highest education level: Not on file  Social Needs  . Financial resource strain: Not on file  . Food insecurity - worry: Not on file  . Food insecurity - inability: Not on file  . Transportation needs - medical: Not on file  . Transportation needs - non-medical: Not on file    Occupational History  . Occupation: [employer]    Employer: RETIRED  Tobacco Use  . Smoking status: Never Smoker  . Smokeless tobacco: Former Network engineer and Sexual Activity  . Alcohol use: No  . Drug use: No  . Sexual activity: No  Other Topics Concern  . Not on file  Social History Narrative   Retired now does some antiques   Retired from Theatre manager and dye work   Married 1951  5 kids      Review of Systems  All other systems reviewed and are negative.      Objective:   Physical Exam  Constitutional: He appears well-developed and well-nourished. No distress.  HENT:  Mouth/Throat: Oropharynx is clear and moist.  Neck: Neck supple. No JVD present. No thyromegaly present.  Cardiovascular: Normal rate and normal heart sounds. An irregularly irregular rhythm present.  Pulmonary/Chest: Breath sounds normal. No respiratory distress. He has no wheezes. He has no rales.  Abdominal: Soft. Bowel sounds are normal. He exhibits no distension and no mass. There is no tenderness. There is no rebound and no guarding.  Musculoskeletal: He exhibits no edema.  Lymphadenopathy:    He has no cervical adenopathy.  Skin: No rash noted. He is not diaphoretic. No erythema.  Vitals reviewed.   Most recent labs: Admission on 09/29/2017, Discharged on 09/29/2017  Component Date Value Ref Range Status  . Color, Urine 09/29/2017 YELLOW  YELLOW Final  . APPearance 09/29/2017 CLEAR  CLEAR Final  . Specific Gravity, Urine 09/29/2017 1.014  1.005 - 1.030 Final  . pH 09/29/2017 6.0  5.0 - 8.0 Final  . Glucose, UA 09/29/2017 NEGATIVE  NEGATIVE mg/dL Final  . Hgb urine dipstick 09/29/2017 NEGATIVE  NEGATIVE Final  . Bilirubin Urine 09/29/2017 NEGATIVE  NEGATIVE Final  . Ketones, ur 09/29/2017 NEGATIVE  NEGATIVE mg/dL Final  . Protein, ur 09/29/2017 30* NEGATIVE mg/dL Final  . Nitrite 09/29/2017 NEGATIVE  NEGATIVE Final  . Leukocytes, UA 09/29/2017 TRACE* NEGATIVE Final  . RBC / HPF  09/29/2017 0-5  0 - 5 RBC/hpf Final  . WBC, UA 09/29/2017 0-5  0 - 5 WBC/hpf Final  . Bacteria, UA 09/29/2017 NONE SEEN  NONE SEEN Final  . Squamous Epithelial / LPF 09/29/2017 NONE SEEN  NONE SEEN Final   Performed at [facility], [address] [place],  [address]  Appointment on 09/07/2017  Component Date Value Ref Range Status  . Hgb A1c MFr Bld 09/07/2017 6.0* <5.7 % of total Hgb Final   Comment: For someone without known diabetes, a hemoglobin  A1c value between 5.7% and 6.4% is consistent with prediabetes and should be confirmed with a  follow-up test. . For someone with known diabetes, a value <7% indicates that their diabetes is well controlled. A1c targets should be individualized based on duration of diabetes, age, comorbid conditions, and other considerations. . This assay result is consistent with an increased risk of diabetes. . Currently, no consensus exists regarding use of hemoglobin A1c for diagnosis of diabetes for children. .   . Mean Plasma Glucose 09/07/2017 126  (calc) Final  . eAG (mmol/L) 09/07/2017 7.0  (calc) Final  . Cholesterol 09/07/2017 103  <200 mg/dL Final  . HDL 09/07/2017 21* >40 mg/dL Final  . Triglycerides 09/07/2017 128  <150 mg/dL Final  . LDL Cholesterol (Calc) 09/07/2017 61  mg/dL (calc) Final   Comment: Reference range: <100 . Desirable range <100 mg/dL for primary prevention;   <70 mg/dL for patients with CHD or diabetic patients  with > or = 2 CHD risk factors. [name] LDL-C is now calculated using the Martin-Hopkins  calculation, which is a validated novel method providing  better accuracy than the Friedewald equation in the  estimation of LDL-C.  Cresenciano Genre et al. Annamaria Helling. 6384;665(99): 2061-2068  (http://education.[facility].com/faq/FAQ164)   . Total CHOL/HDL Ratio 09/07/2017 4.9  <5.0 (calc) Final  . Non-HDL Cholesterol (Calc) 09/07/2017 82  <130 mg/dL (calc) Final   Comment: For patients  with diabetes plus 1 major  ASCVD risk  factor, treating to a non-HDL-C goal of <100 mg/dL  (LDL-C of <70 mg/dL) is considered a therapeutic  option.   Admission on 08/18/2017, Discharged on 08/18/2017  Component Date Value Ref Range Status  . Glucose-Capillary 08/18/2017 100* 65 - 99 mg/dL Final  Appointment on 08/16/2017  Component Date Value Ref Range Status  . Glucose, Bld 08/16/2017 121* 65 - 99 mg/dL Final   Comment: .            Fasting reference interval . For someone without known diabetes, a glucose value between 100 and 125 mg/dL is consistent with prediabetes and should be confirmed with a follow-up test. .   . BUN 08/16/2017 31* 7 - 25 mg/dL Final  . Creat 08/16/2017 1.32* 0.70 - 1.11 mg/dL Final   Comment: For patients >51 years of age, the reference limit for Creatinine is approximately 13% higher for people identified as African-American. .   . GFR, Est Non African American 08/16/2017 48* > OR = 60 mL/min/1.64m Final  . GFR, Est African American 08/16/2017 56* > OR = 60 mL/min/1.761mFinal  . BUN/Creatinine Ratio 08/16/2017 23* 6 - 22 (calc) Final  . Sodium 08/16/2017 135  135 - 146 mmol/L Final  . Potassium 08/16/2017 4.6  3.5 - 5.3 mmol/L Final  . Chloride 08/16/2017 102  98 - 110 mmol/L Final  . CO2 08/16/2017 25  20 - 32 mmol/L Final  . Calcium 08/16/2017 9.6  8.6 - 10.3 mg/dL Final  . Total Protein 08/16/2017 7.0  6.1 - 8.1 g/dL Final  . Albumin 08/16/2017 4.1  3.6 - 5.1 g/dL Final  . Globulin 08/16/2017 2.9  1.9 - 3.7 g/dL (calc) Final  . AG Ratio 08/16/2017 1.4  1.0 - 2.5 (calc) Final  . Total Bilirubin 08/16/2017 0.6  0.2 - 1.2 mg/dL Final  . Alkaline phosphatase (APISO) 08/16/2017 37* 40 - 115 U/L Final  . AST 08/16/2017 31  10 - 35 U/L Final  . ALT 08/16/2017 17  9 - 46 U/L Final  . EXTRA LAVENDER-TOP TUBE 08/16/2017    Final   Comment: We received an extra specimen with no test requested. If any test is desired for this specimen please call client services and advise.            Assessment & Plan:  Chronic atrial fibrillation (HCC)  Chronic kidney disease, stage III (moderate) (HCC)  PVD (peripheral vascular disease) ([place] Essential hypertension  Controlled type 2 diabetes mellitus without complication, without long-term current use of insulin ([place] Pure hypercholesterolemia  His lab work is excellent.  Hemoglobin A1c indicates excellent control.  LDL cholesterol is below 70.  Immunizations are up-to-date.  I am very concerned by his blood pressure.  Add amlodipine 10 mg a day and recheck blood pressure in 3 weeks.  Also recheck pre-ulcerative callus on the fifth MTP joint in 3 weeks and debride hyperkeratotic tissue if necessary

## 2017-11-01 ENCOUNTER — Ambulatory Visit (INDEPENDENT_AMBULATORY_CARE_PROVIDER_SITE_OTHER): Payer: PPO | Admitting: Family Medicine

## 2017-11-01 ENCOUNTER — Encounter: Payer: Self-pay | Admitting: Family Medicine

## 2017-11-01 VITALS — BP 158/64 | HR 46 | Temp 98.4°F | Resp 16 | Ht 65.0 in | Wt 183.0 lb

## 2017-11-01 DIAGNOSIS — I10 Essential (primary) hypertension: Secondary | ICD-10-CM | POA: Diagnosis not present

## 2017-11-01 MED ORDER — HYDROCHLOROTHIAZIDE 25 MG PO TABS: 25.0000 mg | ORAL_TABLET | Freq: Every day | ORAL | 3 refills | Status: DC

## 2017-11-01 NOTE — Progress Notes (Signed)
Subjective:    Patient ID: Eric [name]., male    DOB: 1928/04/15, 84 y.o.   MRN: [id number]  HPI  10/11/17 Here for follow up.  Blood pressure recently at home has been elevated.  Blood pressure averages between 160 and 200/70-90.  Here today his blood pressure is 172/68.  He states that he is compliant with his lisinopril and his metoprolol.  He denies any chest pain shortness of breath or dyspnea on exertion.  Overall he states that he feels well.  He does have neuropathy in both feet and diminished sensation.  On the plantar surface of his right fifth MTP joint, there is a pre-ulcerative callus roughly the size of a nickel.  There is no skin breakdown.  There is some bruising within the callus.  He denies any myalgias or right upper quadrant pain.  He denies any orthopnea or paroxysmal nocturnal dyspnea.  Immunizations are up-to-date.  At that time, my plan was: His lab work is excellent.  Hemoglobin A1c indicates excellent control.  LDL cholesterol is below 70.  Immunizations are up-to-date.  I am very concerned by his blood pressure.  Add amlodipine 10 mg a day and recheck blood pressure in 3 weeks.  Also recheck pre-ulcerative callus on the fifth MTP joint in 3 weeks and debride hyperkeratotic tissue if necessary  11/01/17 Since starting amlodipine 10 mg a day, his blood pressure has been averaging between 786 and 754 systolic over 49-20 diastolic.  He denies any significant leg swelling.  He denies any chest pain shortness of breath or dyspnea on exertion.  He is checking his blood pressure multiple times a day and seeing no improvement on the medication.  He denies any hypotensive episodes.  He is taking Lasix every day as needed for leg swelling however he has very little leg swelling and no evidence of pulmonary edema  Past Medical History:  Diagnosis Date  . Atrial fibrillation ([place])   . Atrial flutter ([place])    s/p RFCA  . CAD (coronary artery disease)    cath 2003, occluded S-RCA,  occluded S-Dx, L-LAD ok, s/p PTCA to LAD  . Cataract   . CKD (chronic kidney disease) stage 3, GFR 30-59 ml/min (HCC)   . Diabetes mellitus    diet controlled  . Hearing aid worn    bilateral  . Long term (current) use of anticoagulants   . Neuromuscular disorder ([place])   . OSA (obstructive sleep apnea) 12/11   very mild, AHI 7/hr  . Persistent atrial fibrillation ([place]) 09/26/2015  . Pure hyperglyceridemia   . PVD (peripheral vascular disease) ([place])    angioplasty of his right lower extremity in [place] by Dr.[name] 2013  . Unspecified essential hypertension   . Wears dentures    full upper and lower   Past Surgical History:  Procedure Laterality Date  . Adenosine Myoview  3/06   EF 56%, neg. Ischemia  . Adenosine Myoview  02/18/07   nml  . ANGIOPLASTY  1/99   CAD- diogonal with rotational artherectomy  . Arthrectomy     of LAD & PTCA  . BLEPHAROPLASTY Bilateral   . CARDIAC CATHETERIZATION  1/00  . CARDIOVERSION  1/04  . CARDIOVERSION  5/07   hospital- a flutter  . CARPAL TUNNEL RELEASE     ? bilateral  . CATARACT EXTRACTION W/PHACO Right 07/28/2017   Procedure: CATARACT EXTRACTION PHACO AND INTRAOCULAR LENS PLACEMENT (IOC) RIGHT DIABETIC;  Surgeon: [name], MD;  Location: [facility];  Service: Ophthalmology;  Laterality: Right;  Diabetic - diet controlled  . CATARACT EXTRACTION W/PHACO Left 08/18/2017   Procedure: CATARACT EXTRACTION PHACO AND INTRAOCULAR LENS PLACEMENT (IOC);  Surgeon: [name], MD;  Location: [facility];  Service: Ophthalmology;  Laterality: Left;  DIABETES - oral meds  . COLONOSCOPY W/ BIOPSIES  10/01/06   sigmoid polyp bx neg, 3 years  . CORONARY ANGIOPLASTY  4/03   cutting balloon PTCA pLAD into Diag  . CORONARY ARTERY BYPASS GRAFT  2000   LIMA-LAD, SVG-RCA, SVG-Diag; SVG-Diag & SVG-RCA occluded 2003  . FRACTURE SURGERY    . HAND SURGERY  08/27/09   R thumb procedure wit Scaphoid Gragt and screws, Dr [name]  .  NM MYOVIEW LTD  4/11   normal  . PERIPHERAL VASCULAR CATHETERIZATION Right 06/27/2015   Procedure: Lower Extremity Angiography;  Surgeon: Algernon [name], MD;  Location: [facility];  Service: Cardiovascular;  Laterality: Right;  . PERIPHERAL VASCULAR CATHETERIZATION  06/27/2015   Procedure: Lower Extremity Intervention;  Surgeon: Algernon [name], MD;  Location: [facility];  Service: Cardiovascular;;   Current Outpatient Medications on File Prior to Visit  Medication Sig Dispense Refill  . acetaminophen (TYLENOL) 500 MG tablet Take 1,000 mg by mouth every 8 (eight) hours as needed for mild pain.     [name] amLODipine (NORVASC) 10 MG tablet Take 1 tablet (10 mg total) by mouth daily. 90 tablet 3  . B Complex Vitamins (B COMPLEX-B12) TABS Take 1 tablet by mouth daily.     . Blood Glucose Monitoring Suppl (ONE TOUCH ULTRA SYSTEM KIT) W/DEVICE KIT 1 kit by Does not apply route once. 1 each 0  . cholecalciferol (VITAMIN D) 1000 UNITS tablet Take 1,000 Units by mouth 2 (two) times a week. Takes on Mon and Fri    . CINNAMON PO Take by mouth 2 (two) times daily.     [name] ELIQUIS 2.5 MG TABS tablet Take 1 tablet by mouth twice a day 180 tablet 1  . fenofibrate 160 MG tablet Take 1 tablet (160 mg total) by mouth daily. 90 tablet 2  . fish oil-omega-3 fatty acids 1000 MG capsule Take 3 g by mouth daily.     . furosemide (LASIX) 20 MG tablet Take 1 tablet (20 mg total) by mouth daily. 90 tablet 3  . glucose blood test strip Use as instructed 100 each 3  . lisinopril (PRINIVIL,ZESTRIL) 40 MG tablet Take 1 tablet (40 mg total) by mouth daily. 90 tablet 3  . metoprolol tartrate (LOPRESSOR) 50 MG tablet Take 1 tablet (50 mg total) by mouth 2 (two) times daily. 180 tablet 3  . mometasone (ELOCON) 0.1 % cream Apply 1 application topically daily. (Patient not taking: Reported on 10/11/2017) 45 g 0  . Multiple Vitamin (DAILY MULTIVITAMIN PO) Take 1 tablet by mouth daily.      [name] DELICA LANCETS FINE MISC  check sugar once daily 100 each 2  . rosuvastatin (CRESTOR) 40 MG tablet Take 1 tablet (40 mg total) by mouth daily. 90 tablet 2  . traMADol (ULTRAM) 50 MG tablet Take 1 tablet (50 mg total) by mouth every 6 (six) hours as needed. (Patient not taking: Reported on 10/11/2017) 20 tablet 0   No current facility-administered medications on file prior to visit.    Allergies  Allergen Reactions  . Isosorbide Mononitrate Other (See Comments)    Unknown, doesn't remember   . Ramipril Cough  . Quinidine Gluconate Rash  Social History   Socioeconomic History  . Marital status: Married    Spouse name: Not on file  . Number of children: 5  . Years of education: Not on file  . Highest education level: Not on file  Occupational History  . Occupation: [employer]    Employer: RETIRED  Social Needs  . Financial resource strain: Not on file  . Food insecurity:    Worry: Not on file    Inability: Not on file  . Transportation needs:    Medical: Not on file    Non-medical: Not on file  Tobacco Use  . Smoking status: Never Smoker  . Smokeless tobacco: Former Network engineer and Sexual Activity  . Alcohol use: No  . Drug use: No  . Sexual activity: Never  Lifestyle  . Physical activity:    Days per week: Not on file    Minutes per session: Not on file  . Stress: Not on file  Relationships  . Social connections:    Talks on phone: Not on file    Gets together: Not on file    Attends religious service: Not on file    Active member of club or organization: Not on file    Attends meetings of clubs or organizations: Not on file    Relationship status: Not on file  . Intimate partner violence:    Fear of current or ex partner: Not on file    Emotionally abused: Not on file    Physically abused: Not on file    Forced sexual activity: Not on file  Other Topics Concern  . Not on file  Social History Narrative   Retired now does some antiques   Retired from Theatre manager and dye work    Married 1951   5 kids      Review of Systems  All other systems reviewed and are negative.      Objective:   Physical Exam  Constitutional: He appears well-developed and well-nourished. No distress.  HENT:  Mouth/Throat: Oropharynx is clear and moist.  Neck: Neck supple. No JVD present. No thyromegaly present.  Cardiovascular: Normal rate and normal heart sounds. An irregularly irregular rhythm present.  Pulmonary/Chest: Breath sounds normal. No respiratory distress. He has no wheezes. He has no rales.  Abdominal: Soft. Bowel sounds are normal. He exhibits no distension and no mass. There is no tenderness. There is no rebound and no guarding.  Musculoskeletal: He exhibits no edema.  Lymphadenopathy:    He has no cervical adenopathy.  Skin: No rash noted. He is not diaphoretic. No erythema.  Vitals reviewed.   Most recent labs: Admission on 09/29/2017, Discharged on 09/29/2017  Component Date Value Ref Range Status  . Color, Urine 09/29/2017 YELLOW  YELLOW Final  . APPearance 09/29/2017 CLEAR  CLEAR Final  . Specific Gravity, Urine 09/29/2017 1.014  1.005 - 1.030 Final  . pH 09/29/2017 6.0  5.0 - 8.0 Final  . Glucose, UA 09/29/2017 NEGATIVE  NEGATIVE mg/dL Final  . Hgb urine dipstick 09/29/2017 NEGATIVE  NEGATIVE Final  . Bilirubin Urine 09/29/2017 NEGATIVE  NEGATIVE Final  . Ketones, ur 09/29/2017 NEGATIVE  NEGATIVE mg/dL Final  . Protein, ur 09/29/2017 30* NEGATIVE mg/dL Final  . Nitrite 09/29/2017 NEGATIVE  NEGATIVE Final  . Leukocytes, UA 09/29/2017 TRACE* NEGATIVE Final  . RBC / HPF 09/29/2017 0-5  0 - 5 RBC/hpf Final  . WBC, UA 09/29/2017 0-5  0 - 5 WBC/hpf Final  . Bacteria, UA 09/29/2017  NONE SEEN  NONE SEEN Final  . Squamous Epithelial / LPF 09/29/2017 NONE SEEN  NONE SEEN Final   Performed at [facility], [address] [place], [place] [address]  Appointment on 09/07/2017  Component Date Value Ref Range Status  . Hgb A1c MFr Bld 09/07/2017 6.0* <5.7 % of  total Hgb Final   Comment: For someone without known diabetes, a hemoglobin  A1c value between 5.7% and 6.4% is consistent with prediabetes and should be confirmed with a  follow-up test. . For someone with known diabetes, a value <7% indicates that their diabetes is well controlled. A1c targets should be individualized based on duration of diabetes, age, comorbid conditions, and other considerations. . This assay result is consistent with an increased risk of diabetes. . Currently, no consensus exists regarding use of hemoglobin A1c for diagnosis of diabetes for children. .   . Mean Plasma Glucose 09/07/2017 126  (calc) Final  . eAG (mmol/L) 09/07/2017 7.0  (calc) Final  . Cholesterol 09/07/2017 103  <200 mg/dL Final  . HDL 09/07/2017 21* >40 mg/dL Final  . Triglycerides 09/07/2017 128  <150 mg/dL Final  . LDL Cholesterol (Calc) 09/07/2017 61  mg/dL (calc) Final   Comment: Reference range: <100 . Desirable range <100 mg/dL for primary prevention;   <70 mg/dL for patients with CHD or diabetic patients  with > or = 2 CHD risk factors. [name] LDL-C is now calculated using the Martin-Hopkins  calculation, which is a validated novel method providing  better accuracy than the Friedewald equation in the  estimation of LDL-C.  Cresenciano Genre et al. Annamaria Helling. 3568;616(83): 2061-2068  (http://education.[employer].com/faq/FAQ164)   . Total CHOL/HDL Ratio 09/07/2017 4.9  <5.0 (calc) Final  . Non-HDL Cholesterol (Calc) 09/07/2017 82  <130 mg/dL (calc) Final   Comment: For patients with diabetes plus 1 major ASCVD risk  factor, treating to a non-HDL-C goal of <100 mg/dL  (LDL-C of <70 mg/dL) is considered a therapeutic  option.          Assessment & Plan:  Essential hypertension - Plan: hydrochlorothiazide (HYDRODIURIL) 25 MG tablet  Discontinue Lasix and replace with hydrochlorothiazide 25 mg a day.  I do not feel that the patient needs a strong diuretic but would benefit from a  better more effective blood pressure medication.  Continue amlodipine 10 mg a day and recheck blood pressure in 1 month.

## 2017-11-05 ENCOUNTER — Encounter: Payer: Self-pay | Admitting: Family Medicine

## 2017-11-05 MED ORDER — APIXABAN 2.5 MG PO TABS: 2.5000 mg | ORAL_TABLET | Freq: Two times a day (BID) | ORAL | 1 refills | Status: DC

## 2017-11-09 ENCOUNTER — Ambulatory Visit (INDEPENDENT_AMBULATORY_CARE_PROVIDER_SITE_OTHER): Payer: PPO | Admitting: Family Medicine

## 2017-11-09 VITALS — BP 136/58 | HR 60 | Temp 97.8°F | Resp 18 | Ht 65.0 in | Wt 184.0 lb

## 2017-11-09 DIAGNOSIS — L03116 Cellulitis of left lower limb: Secondary | ICD-10-CM

## 2017-11-09 MED ORDER — SULFAMETHOXAZOLE-TRIMETHOPRIM 800-160 MG PO TABS: 1.0000 | ORAL_TABLET | Freq: Two times a day (BID) | ORAL | 0 refills | Status: DC

## 2017-11-09 MED ORDER — AMOXICILLIN-POT CLAVULANATE 875-125 MG PO TABS: 1.0000 | ORAL_TABLET | Freq: Two times a day (BID) | ORAL | 0 refills | Status: DC

## 2017-11-09 NOTE — Progress Notes (Signed)
Subjective:    Patient ID: Eric Hough., male    DOB: 10-20-1927, 84 y.o.   MRN: [id number]  HPI  Patient presents with redness and swelling in his left foot.  There is an area approximately the size of my hand on the dorsal surface of his left foot that is erythematous indurated and painful.  It is warm to the touch.  On the medial aspect of the left great toe is a large blood blister that appears to be the point of entry for infection.  Patient appears to have developed a superficial ulcer on the medial aspect of the great toe.  This appears to have become secondarily infected and now has cellulitis on the dorsum of the foot Past Medical History:  Diagnosis Date  . Atrial fibrillation ([place])   . Atrial flutter ([place])    s/p RFCA  . CAD (coronary artery disease)    cath 2003, occluded S-RCA, occluded S-Dx, L-LAD ok, s/p PTCA to LAD  . Cataract   . CKD (chronic kidney disease) stage 3, GFR 30-59 ml/min (HCC)   . Diabetes mellitus    diet controlled  . Hearing aid worn    bilateral  . Long term (current) use of anticoagulants   . Neuromuscular disorder ([place])   . OSA (obstructive sleep apnea) 12/11   very mild, AHI 7/hr  . Persistent atrial fibrillation ([place]) 09/26/2015  . Pure hyperglyceridemia   . PVD (peripheral vascular disease) ([place])    angioplasty of his right lower extremity in [place] by Dr.[name] 2013  . Unspecified essential hypertension   . Wears dentures    full upper and lower   Past Surgical History:  Procedure Laterality Date  . Adenosine Myoview  3/06   EF 56%, neg. Ischemia  . Adenosine Myoview  02/18/07   nml  . ANGIOPLASTY  1/99   CAD- diogonal with rotational artherectomy  . Arthrectomy     of LAD & PTCA  . BLEPHAROPLASTY Bilateral   . CARDIAC CATHETERIZATION  1/00  . CARDIOVERSION  1/04  . CARDIOVERSION  5/07   hospital- a flutter  . CARPAL TUNNEL RELEASE     ? bilateral  . CATARACT EXTRACTION W/PHACO Right 07/28/2017   Procedure: CATARACT  EXTRACTION PHACO AND INTRAOCULAR LENS PLACEMENT ([place]) RIGHT DIABETIC;  Surgeon: [name], MD;  Location: [facility];  Service: Ophthalmology;  Laterality: Right;  Diabetic - diet controlled  . CATARACT EXTRACTION W/PHACO Left 08/18/2017   Procedure: CATARACT EXTRACTION PHACO AND INTRAOCULAR LENS PLACEMENT (IOC);  Surgeon: [name], MD;  Location: [facility];  Service: Ophthalmology;  Laterality: Left;  DIABETES - oral meds  . COLONOSCOPY W/ BIOPSIES  10/01/06   sigmoid polyp bx neg, 3 years  . CORONARY ANGIOPLASTY  4/03   cutting balloon PTCA pLAD into Diag  . CORONARY ARTERY BYPASS GRAFT  2000   LIMA-LAD, SVG-RCA, SVG-Diag; SVG-Diag & SVG-RCA occluded 2003  . FRACTURE SURGERY    . HAND SURGERY  08/27/09   R thumb procedure wit Scaphoid Gragt and screws, Dr [name]  . NM MYOVIEW LTD  4/11   normal  . PERIPHERAL VASCULAR CATHETERIZATION Right 06/27/2015   Procedure: Lower Extremity Angiography;  Surgeon: Algernon [name], MD;  Location: [facility];  Service: Cardiovascular;  Laterality: Right;  . PERIPHERAL VASCULAR CATHETERIZATION  06/27/2015   Procedure: Lower Extremity Intervention;  Surgeon: Algernon [name], MD;  Location: [facility];  Service: Cardiovascular;;   Current Outpatient Medications  on File Prior to Visit  Medication Sig Dispense Refill  . acetaminophen (TYLENOL) 500 MG tablet Take 1,000 mg by mouth every 8 (eight) hours as needed for mild pain.     [name] amLODipine (NORVASC) 10 MG tablet Take 1 tablet (10 mg total) by mouth daily. 90 tablet 3  . apixaban (ELIQUIS) 2.5 MG TABS tablet Take 1 tablet (2.5 mg total) by mouth 2 (two) times daily. 180 tablet 1  . B Complex Vitamins (B COMPLEX-B12) TABS Take 1 tablet by mouth daily.     . Blood Glucose Monitoring Suppl (ONE TOUCH ULTRA SYSTEM KIT) W/DEVICE KIT 1 kit by Does not apply route once. 1 each 0  . cholecalciferol (VITAMIN D) 1000 UNITS tablet Take 1,000 Units by mouth 2 (two) times  a week. Takes on Mon and Fri    . CINNAMON PO Take by mouth 2 (two) times daily.     . fenofibrate 160 MG tablet Take 1 tablet (160 mg total) by mouth daily. 90 tablet 2  . fish oil-omega-3 fatty acids 1000 MG capsule Take 3 g by mouth daily.     [name] glucose blood test strip Use as instructed 100 each 3  . hydrochlorothiazide (HYDRODIURIL) 25 MG tablet Take 1 tablet (25 mg total) by mouth daily. 90 tablet 3  . lisinopril (PRINIVIL,ZESTRIL) 40 MG tablet Take 1 tablet (40 mg total) by mouth daily. 90 tablet 3  . metoprolol tartrate (LOPRESSOR) 50 MG tablet Take 1 tablet (50 mg total) by mouth 2 (two) times daily. 180 tablet 3  . Multiple Vitamin (DAILY MULTIVITAMIN PO) Take 1 tablet by mouth daily.      [name] DELICA LANCETS FINE MISC check sugar once daily 100 each 2  . rosuvastatin (CRESTOR) 40 MG tablet Take 1 tablet (40 mg total) by mouth daily. 90 tablet 2   No current facility-administered medications on file prior to visit.    Allergies  Allergen Reactions  . Isosorbide Mononitrate Other (See Comments)    Unknown, doesn't remember   . Ramipril Cough  . Quinidine Gluconate Rash   Social History   Socioeconomic History  . Marital status: Married    Spouse name: Not on file  . Number of children: 5  . Years of education: Not on file  . Highest education level: Not on file  Occupational History  . Occupation: [employer]    Employer: RETIRED  Social Needs  . Financial resource strain: Not on file  . Food insecurity:    Worry: Not on file    Inability: Not on file  . Transportation needs:    Medical: Not on file    Non-medical: Not on file  Tobacco Use  . Smoking status: Never Smoker  . Smokeless tobacco: Former Network engineer and Sexual Activity  . Alcohol use: No  . Drug use: No  . Sexual activity: Never  Lifestyle  . Physical activity:    Days per week: Not on file    Minutes per session: Not on file  . Stress: Not on file  Relationships  . Social  connections:    Talks on phone: Not on file    Gets together: Not on file    Attends religious service: Not on file    Active member of club or organization: Not on file    Attends meetings of clubs or organizations: Not on file    Relationship status: Not on file  . Intimate partner violence:  Fear of current or ex partner: Not on file    Emotionally abused: Not on file    Physically abused: Not on file    Forced sexual activity: Not on file  Other Topics Concern  . Not on file  Social History Narrative   Retired now does some antiques   Retired from Theatre manager and dye work   Married 1951   5 kids     Review of Systems  All other systems reviewed and are negative.      Objective:   Physical Exam  Cardiovascular: Normal rate, regular rhythm and normal heart sounds.  Pulmonary/Chest: Effort normal and breath sounds normal. No respiratory distress. He has no wheezes.  Musculoskeletal:       Left foot: There is tenderness, swelling and deformity.       Feet:  Skin: Skin is warm. There is erythema.  Vitals reviewed.         Assessment & Plan:  Cellulitis of left lower extremity  The wound on the medial aspect of the left great toe is covered in Silvadene and then wrapped in nonadherent gauze and the foot was wrapped in Coban.  I would like him to return tomorrow for dressing change.  I will treat the cellulitis in the left foot is a diabetic foot infection.  I will start the patient on Augmentin for coverage of strep as well as anaerobes.  I will also add Bactrim to cover for MRSA.  Patient will be rechecked tomorrow by my partner.  The area of cellulitis is outlined in black cheek on the dorsum of the foot.  If erythema streaking beyond the black ink, the patient will need to go the emergency room for IV antibiotics.  I will receive recheck the patient on Thrusday.

## 2017-11-10 ENCOUNTER — Encounter: Payer: Self-pay | Admitting: Family Medicine

## 2017-11-10 ENCOUNTER — Encounter (HOSPITAL_COMMUNITY): Payer: Self-pay | Admitting: *Deleted

## 2017-11-10 ENCOUNTER — Ambulatory Visit (INDEPENDENT_AMBULATORY_CARE_PROVIDER_SITE_OTHER): Payer: PPO | Admitting: Family Medicine

## 2017-11-10 ENCOUNTER — Other Ambulatory Visit: Payer: Self-pay

## 2017-11-10 ENCOUNTER — Inpatient Hospital Stay (HOSPITAL_COMMUNITY)
Admission: EM | Admit: 2017-11-10 | Discharge: 2017-11-16 | DRG: 300 | Disposition: A | Discharge status: Home / Self Care | Payer: PPO | Attending: Internal Medicine | Admitting: Internal Medicine

## 2017-11-10 ENCOUNTER — Emergency Department (HOSPITAL_COMMUNITY): Payer: PPO

## 2017-11-10 VITALS — BP 128/66 | HR 64 | Temp 98.1°F | Resp 16 | Ht 65.0 in | Wt 182.0 lb

## 2017-11-10 DIAGNOSIS — M7989 Other specified soft tissue disorders: Secondary | ICD-10-CM | POA: Diagnosis not present

## 2017-11-10 DIAGNOSIS — E1152 Type 2 diabetes mellitus with diabetic peripheral angiopathy with gangrene: Secondary | ICD-10-CM | POA: Diagnosis not present

## 2017-11-10 DIAGNOSIS — E781 Pure hyperglyceridemia: Secondary | ICD-10-CM | POA: Diagnosis not present

## 2017-11-10 DIAGNOSIS — E11621 Type 2 diabetes mellitus with foot ulcer: Secondary | ICD-10-CM | POA: Diagnosis not present

## 2017-11-10 DIAGNOSIS — I4892 Unspecified atrial flutter: Secondary | ICD-10-CM | POA: Diagnosis present

## 2017-11-10 DIAGNOSIS — Z800 Family history of malignant neoplasm of digestive organs: Secondary | ICD-10-CM

## 2017-11-10 DIAGNOSIS — L0390 Cellulitis, unspecified: Secondary | ICD-10-CM | POA: Diagnosis present

## 2017-11-10 DIAGNOSIS — Z7901 Long term (current) use of anticoagulants: Secondary | ICD-10-CM | POA: Diagnosis not present

## 2017-11-10 DIAGNOSIS — I481 Persistent atrial fibrillation: Secondary | ICD-10-CM | POA: Diagnosis not present

## 2017-11-10 DIAGNOSIS — Z951 Presence of aortocoronary bypass graft: Secondary | ICD-10-CM | POA: Diagnosis not present

## 2017-11-10 DIAGNOSIS — E875 Hyperkalemia: Secondary | ICD-10-CM | POA: Diagnosis not present

## 2017-11-10 DIAGNOSIS — L03116 Cellulitis of left lower limb: Secondary | ICD-10-CM | POA: Diagnosis not present

## 2017-11-10 DIAGNOSIS — Z803 Family history of malignant neoplasm of breast: Secondary | ICD-10-CM

## 2017-11-10 DIAGNOSIS — Z9842 Cataract extraction status, left eye: Secondary | ICD-10-CM

## 2017-11-10 DIAGNOSIS — N183 Chronic kidney disease, stage 3 (moderate): Secondary | ICD-10-CM | POA: Diagnosis present

## 2017-11-10 DIAGNOSIS — I129 Hypertensive chronic kidney disease with stage 1 through stage 4 chronic kidney disease, or unspecified chronic kidney disease: Secondary | ICD-10-CM | POA: Diagnosis not present

## 2017-11-10 DIAGNOSIS — I70262 Atherosclerosis of native arteries of extremities with gangrene, left leg: Secondary | ICD-10-CM | POA: Diagnosis not present

## 2017-11-10 DIAGNOSIS — I2510 Atherosclerotic heart disease of native coronary artery without angina pectoris: Secondary | ICD-10-CM | POA: Diagnosis not present

## 2017-11-10 DIAGNOSIS — N400 Enlarged prostate without lower urinary tract symptoms: Secondary | ICD-10-CM | POA: Diagnosis present

## 2017-11-10 DIAGNOSIS — L97529 Non-pressure chronic ulcer of other part of left foot with unspecified severity: Secondary | ICD-10-CM | POA: Diagnosis not present

## 2017-11-10 DIAGNOSIS — E119 Type 2 diabetes mellitus without complications: Secondary | ICD-10-CM | POA: Diagnosis present

## 2017-11-10 DIAGNOSIS — Z974 Presence of external hearing-aid: Secondary | ICD-10-CM | POA: Diagnosis not present

## 2017-11-10 DIAGNOSIS — N179 Acute kidney failure, unspecified: Secondary | ICD-10-CM | POA: Diagnosis present

## 2017-11-10 DIAGNOSIS — E1151 Type 2 diabetes mellitus with diabetic peripheral angiopathy without gangrene: Secondary | ICD-10-CM

## 2017-11-10 DIAGNOSIS — E1122 Type 2 diabetes mellitus with diabetic chronic kidney disease: Secondary | ICD-10-CM | POA: Diagnosis present

## 2017-11-10 DIAGNOSIS — I10 Essential (primary) hypertension: Secondary | ICD-10-CM | POA: Diagnosis present

## 2017-11-10 DIAGNOSIS — G4733 Obstructive sleep apnea (adult) (pediatric): Secondary | ICD-10-CM | POA: Diagnosis not present

## 2017-11-10 DIAGNOSIS — G4761 Periodic limb movement disorder: Secondary | ICD-10-CM | POA: Diagnosis present

## 2017-11-10 DIAGNOSIS — M79672 Pain in left foot: Secondary | ICD-10-CM | POA: Diagnosis not present

## 2017-11-10 DIAGNOSIS — I4819 Other persistent atrial fibrillation: Secondary | ICD-10-CM

## 2017-11-10 DIAGNOSIS — Z9841 Cataract extraction status, right eye: Secondary | ICD-10-CM

## 2017-11-10 DIAGNOSIS — Z823 Family history of stroke: Secondary | ICD-10-CM

## 2017-11-10 DIAGNOSIS — Z8249 Family history of ischemic heart disease and other diseases of the circulatory system: Secondary | ICD-10-CM

## 2017-11-10 DIAGNOSIS — I739 Peripheral vascular disease, unspecified: Secondary | ICD-10-CM | POA: Diagnosis not present

## 2017-11-10 DIAGNOSIS — Z801 Family history of malignant neoplasm of trachea, bronchus and lung: Secondary | ICD-10-CM

## 2017-11-10 DIAGNOSIS — Z961 Presence of intraocular lens: Secondary | ICD-10-CM | POA: Diagnosis present

## 2017-11-10 DIAGNOSIS — S91302A Unspecified open wound, left foot, initial encounter: Secondary | ICD-10-CM | POA: Diagnosis not present

## 2017-11-10 DIAGNOSIS — I96 Gangrene, not elsewhere classified: Secondary | ICD-10-CM | POA: Diagnosis not present

## 2017-11-10 DIAGNOSIS — Z86010 Personal history of colonic polyps: Secondary | ICD-10-CM | POA: Diagnosis not present

## 2017-11-10 DIAGNOSIS — Z8042 Family history of malignant neoplasm of prostate: Secondary | ICD-10-CM

## 2017-11-10 DIAGNOSIS — Z888 Allergy status to other drugs, medicaments and biological substances status: Secondary | ICD-10-CM

## 2017-11-10 DIAGNOSIS — Z9861 Coronary angioplasty status: Secondary | ICD-10-CM

## 2017-11-10 DIAGNOSIS — Z87891 Personal history of nicotine dependence: Secondary | ICD-10-CM

## 2017-11-10 DIAGNOSIS — E1140 Type 2 diabetes mellitus with diabetic neuropathy, unspecified: Secondary | ICD-10-CM | POA: Diagnosis present

## 2017-11-10 LAB — CBC WITH DIFFERENTIAL/PLATELET
BASOS ABS: 0 10*3/uL (ref 0.0–0.1)
Basophils Relative: 0 %
Eosinophils Absolute: 0.1 10*3/uL (ref 0.0–0.7)
Eosinophils Relative: 1 %
HEMATOCRIT: 34.2 % — AB (ref 39.0–52.0)
Hemoglobin: 11.2 g/dL — ABNORMAL LOW (ref 13.0–17.0)
LYMPHS ABS: 2.7 10*3/uL (ref 0.7–4.0)
LYMPHS PCT: 24 %
MCH: 30.5 pg (ref 26.0–34.0)
MCHC: 32.7 g/dL (ref 30.0–36.0)
MCV: 93.2 fL (ref 78.0–100.0)
MONO ABS: 1 10*3/uL (ref 0.1–1.0)
Monocytes Relative: 9 %
NEUTROS ABS: 7.3 10*3/uL (ref 1.7–7.7)
Neutrophils Relative %: 66 %
Platelets: 221 10*3/uL (ref 150–400)
RBC: 3.67 MIL/uL — ABNORMAL LOW (ref 4.22–5.81)
RDW: 15.2 % (ref 11.5–15.5)
WBC: 11.2 10*3/uL — ABNORMAL HIGH (ref 4.0–10.5)

## 2017-11-10 LAB — URINALYSIS, ROUTINE W REFLEX MICROSCOPIC
Bilirubin Urine: NEGATIVE
GLUCOSE, UA: NEGATIVE mg/dL
Hgb urine dipstick: NEGATIVE
Ketones, ur: NEGATIVE mg/dL
LEUKOCYTES UA: NEGATIVE
Nitrite: NEGATIVE
PH: 6 (ref 5.0–8.0)
Protein, ur: NEGATIVE mg/dL
Specific Gravity, Urine: 1.009 (ref 1.005–1.030)

## 2017-11-10 LAB — COMPREHENSIVE METABOLIC PANEL
ALBUMIN: 3.3 g/dL — AB (ref 3.5–5.0)
ALT: 19 U/L (ref 17–63)
AST: 38 U/L (ref 15–41)
Alkaline Phosphatase: 38 U/L (ref 38–126)
Anion gap: 9 (ref 5–15)
BILIRUBIN TOTAL: 0.8 mg/dL (ref 0.3–1.2)
BUN: 32 mg/dL — ABNORMAL HIGH (ref 6–20)
CHLORIDE: 98 mmol/L — AB (ref 101–111)
CO2: 23 mmol/L (ref 22–32)
Calcium: 9.4 mg/dL (ref 8.9–10.3)
Creatinine, Ser: 1.79 mg/dL — ABNORMAL HIGH (ref 0.61–1.24)
GFR calc Af Amer: 38 mL/min — ABNORMAL LOW (ref >60)
GFR calc non Af Amer: 32 mL/min — ABNORMAL LOW (ref >60)
GLUCOSE: 87 mg/dL (ref 65–99)
POTASSIUM: 5.7 mmol/L — AB (ref 3.5–5.1)
Sodium: 130 mmol/L — ABNORMAL LOW (ref 135–145)
TOTAL PROTEIN: 6.9 g/dL (ref 6.5–8.1)

## 2017-11-10 LAB — I-STAT CG4 LACTIC ACID, ED: LACTIC ACID, VENOUS: 1.2 mmol/L (ref 0.5–1.9)

## 2017-11-10 LAB — C-REACTIVE PROTEIN: CRP: 7.1 mg/dL — AB (ref <1.0)

## 2017-11-10 LAB — SEDIMENTATION RATE: SED RATE: 78 mm/h — AB (ref 0–16)

## 2017-11-10 LAB — GLUCOSE, CAPILLARY
Glucose-Capillary: 94 mg/dL (ref 65–99)
Glucose-Capillary: 119 mg/dL — ABNORMAL HIGH (ref 65–99)

## 2017-11-10 LAB — PREALBUMIN: Prealbumin: 12.3 mg/dL — ABNORMAL LOW (ref 18–38)

## 2017-11-10 MED ORDER — ONDANSETRON HCL 4 MG PO TABS: 4.0000 mg | ORAL_TABLET | Freq: Four times a day (QID) | ORAL | Status: DC | PRN

## 2017-11-10 MED ORDER — AMLODIPINE BESYLATE 10 MG PO TABS
10.0000 mg | ORAL_TABLET | Freq: Every day | ORAL | Status: DC
  Administered 2017-11-11 – 2017-11-16 (×6): 10 mg via ORAL
  Filled 2017-11-10 (×6): qty 1

## 2017-11-10 MED ORDER — ROSUVASTATIN CALCIUM 20 MG PO TABS
40.0000 mg | ORAL_TABLET | Freq: Every day | ORAL | Status: DC
  Administered 2017-11-11 – 2017-11-16 (×6): 40 mg via ORAL
  Filled 2017-11-10 (×6): qty 2

## 2017-11-10 MED ORDER — ACETAMINOPHEN 325 MG PO TABS
650.0000 mg | ORAL_TABLET | Freq: Four times a day (QID) | ORAL | Status: DC | PRN
  Administered 2017-11-13: 650 mg via ORAL

## 2017-11-10 MED ORDER — ONDANSETRON HCL 4 MG/2ML IJ SOLN: 4.0000 mg | Freq: Four times a day (QID) | INTRAMUSCULAR | Status: DC | PRN

## 2017-11-10 MED ORDER — SILVER SULFADIAZINE 1 % EX CREA
TOPICAL_CREAM | Freq: Every day | CUTANEOUS | Status: DC
  Administered 2017-11-11 – 2017-11-16 (×5): via TOPICAL
  Filled 2017-11-10 (×2): qty 85

## 2017-11-10 MED ORDER — ACETAMINOPHEN 650 MG RE SUPP: 650.0000 mg | Freq: Four times a day (QID) | RECTAL | Status: DC | PRN

## 2017-11-10 MED ORDER — MORPHINE SULFATE (PF) 2 MG/ML IV SOLN: 2.0000 mg | INTRAVENOUS | Status: DC | PRN

## 2017-11-10 MED ORDER — DOCUSATE SODIUM 100 MG PO CAPS
100.0000 mg | ORAL_CAPSULE | Freq: Two times a day (BID) | ORAL | Status: DC
  Administered 2017-11-11 – 2017-11-16 (×8): 100 mg via ORAL
  Filled 2017-11-10 (×10): qty 1

## 2017-11-10 MED ORDER — VANCOMYCIN HCL IN DEXTROSE 750-5 MG/150ML-% IV SOLN
750.0000 mg | INTRAVENOUS | Status: DC
Start: 2017-11-11 — End: 2017-11-13
  Administered 2017-11-11 – 2017-11-12 (×2): 750 mg via INTRAVENOUS
  Filled 2017-11-10 (×3): qty 150

## 2017-11-10 MED ORDER — METOPROLOL TARTRATE 25 MG PO TABS
50.0000 mg | ORAL_TABLET | Freq: Two times a day (BID) | ORAL | Status: DC
  Administered 2017-11-10 – 2017-11-16 (×10): 50 mg via ORAL
  Filled 2017-11-10: qty 2
  Filled 2017-11-10 (×5): qty 1
  Filled 2017-11-10 (×2): qty 2
  Filled 2017-11-10 (×3): qty 1

## 2017-11-10 MED ORDER — SODIUM CHLORIDE 0.9 % IV BOLUS
1000.0000 mL | Freq: Once | INTRAVENOUS | Status: AC
  Administered 2017-11-10: 1000 mL via INTRAVENOUS

## 2017-11-10 MED ORDER — SODIUM CHLORIDE 0.9 % IV SOLN
2.0000 g | INTRAVENOUS | Status: DC
  Administered 2017-11-10 – 2017-11-15 (×6): 2 g via INTRAVENOUS
  Filled 2017-11-10 (×8): qty 20

## 2017-11-10 MED ORDER — VANCOMYCIN HCL 10 G IV SOLR
1250.0000 mg | Freq: Once | INTRAVENOUS | Status: AC
  Administered 2017-11-10: 1250 mg via INTRAVENOUS
  Filled 2017-11-10: qty 1250

## 2017-11-10 MED ORDER — METRONIDAZOLE IN NACL 5-0.79 MG/ML-% IV SOLN
500.0000 mg | Freq: Three times a day (TID) | INTRAVENOUS | Status: DC
  Administered 2017-11-10 – 2017-11-16 (×17): 500 mg via INTRAVENOUS
  Filled 2017-11-10 (×18): qty 100

## 2017-11-10 MED ORDER — PIPERACILLIN-TAZOBACTAM 3.375 G IVPB 30 MIN
3.3750 g | Freq: Once | INTRAVENOUS | Status: AC
  Administered 2017-11-10: 3.375 g via INTRAVENOUS
  Filled 2017-11-10: qty 50

## 2017-11-10 MED ORDER — APIXABAN 2.5 MG PO TABS
2.5000 mg | ORAL_TABLET | Freq: Two times a day (BID) | ORAL | Status: DC
  Administered 2017-11-10: 2.5 mg via ORAL
  Filled 2017-11-10 (×2): qty 1

## 2017-11-10 MED ORDER — LACTATED RINGERS IV SOLN
INTRAVENOUS | Status: DC
  Administered 2017-11-10 – 2017-11-12 (×4): via INTRAVENOUS

## 2017-11-10 NOTE — ED Triage Notes (Signed)
Pt sent from PCP office for IV antibiotics, pt has a wound to the bottom of his left foot, redness up to mid calf, denies fever

## 2017-11-10 NOTE — Patient Instructions (Signed)
Go directly to [facility] ER

## 2017-11-10 NOTE — Progress Notes (Signed)
   Subjective:    Patient ID: Eric [name]., male    DOB: 23-Dec-1927, 84 y.o.   MRN: [id number]  Patient presents for Follow-up (left Foot Cellulitis)  Here for interim follow-up on left foot cellulitis.  He was seen yesterday by my partner.  Dates that he does not remember a particular injury to his foot but the skin was cut and then started to peel off.  Here to be a possible blood blister which she is actually had before.  He has peripheral vascular disease as well.  He has been using some type of salve at home for about a week before coming in.  He denies any fever or chills but states that he does have significant pain of his foot to his mid shin. Yesterday Silvadene was applied and his foot was wrapped he was also started on Augmentin and Bactrim as he is a diabetic.  Outline was placed because of his history 24-hour follow-up was arranged to make sure that this was not progressing.  Today he has progression of the cellulitis through his previous markings as well as increased pain up the leg.   Review Of Systems:  GEN- denies fatigue, fever, weight loss,weakness, recent illness HEENT- denies eye drainage, change in vision, nasal discharge, CVS- denies chest pain, palpitations RESP- denies SOB, cough, wheeze ABD- denies N/V, change in stools, abd pain GU- denies dysuria, hematuria, dribbling, incontinence MSK-+joint pain, muscle aches, injury Neuro- denies headache, dizziness, syncope, seizure activity       Objective:    BP 128/66   Pulse 64   Temp 98.1 F (36.7 C) (Oral)   Resp 16   Ht 5\' 5"  (1.651 m)   Wt 182 lb (82.6 kg)   SpO2 96%   BMI 30.29 kg/m  GEN- NAD, alert and oriented x3, sitting comfortably   Right foot- has crusted blister right sole,  Left foot, cellulutis covering most of foot spreading laterally, mild erythema on shin, TTP across entire foot and up to mid shin, sloughing skin between toes, great toe large black appearing blister vs early  necrosis Pulse- diminished bilat        Assessment & Plan:      Problem List Items Addressed This Visit      Unprioritized   PVD (peripheral vascular disease) ([place])    Other Visit Diagnoses    Cellulitis of left foot    -  Primary   Worsening cellulitis and pain, no systemic symptoms, heis also diabetic, PVD. recommend xray of foot, IV antibiotics with his comorbidites and age 84 to [facility], pt and son agreeable to plan      Note: This dictation was prepared with Dragon dictation along with smaller phrase technology. Any transcriptional errors that result from this process are unintentional.

## 2017-11-10 NOTE — Progress Notes (Signed)
ANTIBIOTIC CONSULT NOTE - INITIAL  Pharmacy Consult for Vancomycin Indication: Cellulitis  Allergies  Allergen Reactions  . Isosorbide Mononitrate Other (See Comments)    Unknown, doesn't remember   . Ramipril Cough  . Quinidine Gluconate Rash    Patient Measurements:   Adjusted Body Weight: 82.6  Vital Signs: Temp: 97.7 F (36.5 C) (04/03 1022) Temp Source: Oral (04/03 1022) BP: 113/66 (04/03 1022) Pulse Rate: 59 (04/03 1022) Intake/Output from previous day: No intake/output data recorded. Intake/Output from this shift: No intake/output data recorded.  Labs: Recent Labs    11/10/17 1036  WBC 11.2*  HGB 11.2*  PLT 221  CREATININE 1.79*   Estimated Creatinine Clearance: 28.7 mL/min (A) (by C-G formula based on SCr of 1.79 mg/dL (H)). No results for input(s): VANCOTROUGH, VANCOPEAK, VANCORANDOM, GENTTROUGH, GENTPEAK, GENTRANDOM, TOBRATROUGH, TOBRAPEAK, TOBRARND, AMIKACINPEAK, AMIKACINTROU, AMIKACIN in the last 72 hours.   Microbiology: No results found for this or any previous visit (from the past 720 hour(s)).  Medical History: Past Medical History:  Diagnosis Date  . Atrial fibrillation ([place])   . Atrial flutter ([place])    s/p RFCA  . CAD (coronary artery disease)    cath 2003, occluded S-RCA, occluded S-Dx, L-LAD ok, s/p PTCA to LAD  . Cataract   . CKD (chronic kidney disease) stage 3, GFR 30-59 ml/min (HCC)   . Diabetes mellitus    diet controlled  . Hearing aid worn    bilateral  . Long term (current) use of anticoagulants   . Neuromuscular disorder ([place])   . OSA (obstructive sleep apnea) 12/11   very mild, AHI 7/hr  . Persistent atrial fibrillation ([place]) 09/26/2015  . Pure hyperglyceridemia   . PVD (peripheral vascular disease) ([place])    angioplasty of his right lower extremity in [place] by Dr.[name] 2013  . Unspecified essential hypertension   . Wears dentures    full upper and lower     Assessment:84 yo male who presents with left foot  cellulitis who was seen yesterday and started on PO augmentin and Bactrim for cellulitis and now has progression. CrCl ~28.7 ml/min.Afebrile, WBC 11.2. Pharmacy is consulted for vancomycin therapy.   Goal of Therapy:  Vancomycin trough level 10-15 mcg/ml  Plan:  Vancomycin 1250mg  IV x 1, then 750mg  IV q24h Troughs when clinically appropriate Will follow fever curve, LOT, and cultures  Theotis Burrow, PharmD BCPS Clinical Pharmacist 11/10/2017,12:18 PM

## 2017-11-10 NOTE — H&P (Signed)
History and Physical    Eric [name]. LFY:[id number] DOB: 04-Jul-1928 DOA: 11/10/2017  PCP: [name], MD Consultants:  Stanford Breed - cardiology; [name] - vasscular Patient coming from: Home - lives with wife; NOK: wife, [phone]  Chief Complaint:  Foot infection  HPI: Eric [name]. is a 84 y.o. male with medical history significant of HTNl HLD; PVD s/p RLE angioplasty; afib on AC; OSA; DM; stage 3 CKD; and CAD s/p PCI presenting with left foot cellulitis.  When asked why he came to the hospital, he said, "The doctor said I needed to".  He has a sore on his foot and "it's real red, raised, and they're afraid it's gonna go in deep if he don't get something done about it".  It's been there "a while".  No fevers.  He feels well otherwise.  He previously had an infected blister on his right foot for which he was followed by podiatry.  He was seen yesterday by [name] for this issue and started on Bactrim and Augmentin.  He was seen again today by [name] and sent to the ER due to progressive infection.   ED Course:  R foot cellulitis.  Xray negative for osteo.  Diet-controlled DM.  Great toe is concerning for gangrene.  AKI.  Given Vanc/Zosyn.  Started on outpatient antibiotics yesterday without improvement.  Review of Systems: As per HPI; otherwise review of systems reviewed and negative.   Ambulatory Status:  Ambulated without assistance until recently, now uses a cane while oiutside on uneven ground  Past Medical History:  Diagnosis Date  . Atrial flutter ([place])    s/p RFCA  . CAD (coronary artery disease)    cath 2003, occluded S-RCA, occluded S-Dx, L-LAD ok, s/p PTCA to LAD  . Cataract   . CKD (chronic kidney disease) stage 3, GFR 30-59 ml/min (HCC)   . Diabetes mellitus    diet controlled  . Hearing aid worn    bilateral  . Long term (current) use of anticoagulants   . Neuromuscular disorder ([place])   . OSA (obstructive sleep apnea) 12/11   very mild, AHI 7/hr    . Persistent atrial fibrillation ([place]) 09/26/2015  . Pure hyperglyceridemia   . PVD (peripheral vascular disease) ([place])    angioplasty of his right lower extremity in [place] by Dr.[name] 2013  . Unspecified essential hypertension   . Wears dentures    full upper and lower    Past Surgical History:  Procedure Laterality Date  . Adenosine Myoview  3/06   EF 56%, neg. Ischemia  . Adenosine Myoview  02/18/07   nml  . ANGIOPLASTY  1/99   CAD- diogonal with rotational artherectomy  . Arthrectomy     of LAD & PTCA  . BLEPHAROPLASTY Bilateral   . CARDIAC CATHETERIZATION  1/00  . CARDIOVERSION  1/04  . CARDIOVERSION  5/07   hospital- a flutter  . CARPAL TUNNEL RELEASE     ? bilateral  . CATARACT EXTRACTION W/PHACO Right 07/28/2017   Procedure: CATARACT EXTRACTION PHACO AND INTRAOCULAR LENS PLACEMENT ([place]) RIGHT DIABETIC;  Surgeon: [name], MD;  Location: [facility];  Service: Ophthalmology;  Laterality: Right;  Diabetic - diet controlled  . CATARACT EXTRACTION W/PHACO Left 08/18/2017   Procedure: CATARACT EXTRACTION PHACO AND INTRAOCULAR LENS PLACEMENT ([facility]);  Surgeon: [name], MD;  Location: [facility];  Service: Ophthalmology;  Laterality: Left;  DIABETES - oral meds  . COLONOSCOPY W/ BIOPSIES  10/01/06  sigmoid polyp bx neg, 3 years  . CORONARY ANGIOPLASTY  4/03   cutting balloon PTCA pLAD into Diag  . CORONARY ARTERY BYPASS GRAFT  2000   LIMA-LAD, SVG-RCA, SVG-Diag; SVG-Diag & SVG-RCA occluded 2003  . FRACTURE SURGERY    . HAND SURGERY  08/27/09   R thumb procedure wit Scaphoid Gragt and screws, Dr [name]  . NM MYOVIEW LTD  4/11   normal  . PERIPHERAL VASCULAR CATHETERIZATION Right 06/27/2015   Procedure: Lower Extremity Angiography;  Surgeon: Algernon [name], MD;  Location: [facility];  Service: Cardiovascular;  Laterality: Right;  . PERIPHERAL VASCULAR CATHETERIZATION  06/27/2015   Procedure: Lower Extremity Intervention;   Surgeon: Algernon [name], MD;  Location: [facility];  Service: Cardiovascular;;    Social History   Socioeconomic History  . Marital status: Married    Spouse name: Not on file  . Number of children: 5  . Years of education: Not on file  . Highest education level: Not on file  Occupational History  . Occupation: [employer]    Employer: RETIRED  Social Needs  . Financial resource strain: Not on file  . Food insecurity:    Worry: Not on file    Inability: Not on file  . Transportation needs:    Medical: Not on file    Non-medical: Not on file  Tobacco Use  . Smoking status: Never Smoker  . Smokeless tobacco: Former Network engineer and Sexual Activity  . Alcohol use: No  . Drug use: No  . Sexual activity: Never  Lifestyle  . Physical activity:    Days per week: Not on file    Minutes per session: Not on file  . Stress: Not on file  Relationships  . Social connections:    Talks on phone: Not on file    Gets together: Not on file    Attends religious service: Not on file    Active member of club or organization: Not on file    Attends meetings of clubs or organizations: Not on file    Relationship status: Not on file  . Intimate partner violence:    Fear of current or ex partner: Not on file    Emotionally abused: Not on file    Physically abused: Not on file    Forced sexual activity: Not on file  Other Topics Concern  . Not on file  Social History Narrative   Retired now does some antiques   Retired from Theatre manager and dye work   Married 1951   5 kids    Allergies  Allergen Reactions  . Isosorbide Mononitrate Other (See Comments)    Unknown, doesn't remember   . Ramipril Cough  . Quinidine Gluconate Rash    Family History  Problem Relation Age of Onset  . Stroke Father   . Aneurysm Father   . Hypertension Father   . Prostate cancer Brother   . Hypertension Mother   . Lung cancer Brother        smoker  . Thrombosis Brother   . Other Brother         RF valve disorder (smoker)  . Lung cancer Brother        smoker  . Breast cancer Sister   . Liver cancer Brother   . Other Sister        cerebral hemorrhage    Prior to Admission medications   Medication Sig Start Date End Date Taking?  Authorizing Provider  acetaminophen (TYLENOL) 500 MG tablet Take 1,000 mg by mouth every 8 (eight) hours as needed for mild pain.     [provider]  amLODipine (NORVASC) 10 MG tablet Take 1 tablet (10 mg total) by mouth daily. 10/11/17   [name], MD  amoxicillin-clavulanate (AUGMENTIN) 875-125 MG tablet Take 1 tablet by mouth 2 (two) times daily. 11/09/17   [name], MD  apixaban (ELIQUIS) 2.5 MG TABS tablet Take 1 tablet (2.5 mg total) by mouth 2 (two) times daily. 11/05/17   [name], MD  B Complex Vitamins (B COMPLEX-B12) TABS Take 1 tablet by mouth daily.     [provider]  Blood Glucose Monitoring Suppl (ONE TOUCH ULTRA SYSTEM KIT) W/DEVICE KIT 1 kit by Does not apply route once. 11/01/14   [name], MD  cholecalciferol (VITAMIN D) 1000 UNITS tablet Take 1,000 Units by mouth 2 (two) times a week. Takes on Mon and Fri    [provider]  CINNAMON PO Take by mouth 2 (two) times daily.     [provider]  fenofibrate 160 MG tablet Take 1 tablet (160 mg total) by mouth daily. 04/23/17   [name], MD  fish oil-omega-3 fatty acids 1000 MG capsule Take 3 g by mouth daily.     [provider]  glucose blood test strip Use as instructed 05/26/17   [name], MD  hydrochlorothiazide (HYDRODIURIL) 25 MG tablet Take 1 tablet (25 mg total) by mouth daily. 11/01/17   [name], MD  lisinopril (PRINIVIL,ZESTRIL) 40 MG tablet Take 1 tablet (40 mg total) by mouth daily. 08/20/17   [name], MD  metoprolol tartrate (LOPRESSOR) 50 MG tablet Take 1 tablet (50 mg total) by mouth 2 (two) times daily. 08/20/17   [name], MD  Multiple Vitamin (DAILY  MULTIVITAMIN PO) Take 1 tablet by mouth daily.      [provider]  [facility] DELICA LANCETS FINE MISC check sugar once daily 08/17/17   [name], MD  rosuvastatin (CRESTOR) 40 MG tablet Take 1 tablet (40 mg total) by mouth daily. 08/20/17   [name], MD  sulfamethoxazole-trimethoprim (BACTRIM DS,SEPTRA DS) 800-160 MG tablet Take 1 tablet by mouth 2 (two) times daily. 11/09/17   [name], MD    Physical Exam: Vitals:   11/10/17 1022 11/10/17 1200 11/10/17 1215 11/10/17 1330  BP: 113/66 (!) 132/59 (!) 134/57 127/60  Pulse: (!) 59 (!) 54 (!) 57 (!) 56  Resp: 20     Temp: 97.7 F (36.5 C)     TempSrc: Oral     SpO2: 100% 100% 100% 98%     General:  Appears calm and comfortable and is NAD Eyes:  PERRL, EOMI, normal lids, iris ENT:  grossly normal hearing, lips & tongue, mmm; dentures in place Neck:  no LAD, masses or thyromegaly Cardiovascular:  Irregularly irregular, no m/r/g. No LE edema.  Respiratory:   CTA bilaterally with no wheezes/rales/rhonchi.  Normal respiratory effort. Abdomen:  soft, NT, ND, NABS Skin:  no rash or induration seen on limited exam other than as described below Musculoskeletal:  grossly normal tone BUE/BLE, good ROM, no bony abnormality other than as described below Lower extremity: Diminished but reportedly Dopplerable pulses on the LLE.  Ulceration with eschar along plantar great toe.  Surrounding cellulitis of the left dorsal foot extending to near the ankle.  Patchy erythema of the lower leg, as  well.        Psychiatric:  grossly normal mood and affect, speech fluent and appropriate, AOx3 Neurologic:  CN 2-12 grossly intact, moves all extremities in coordinated fashion, sensation intact other than the left foot (limited exam due to current issue)    Radiological Exams on Admission: Dg Foot Complete Left  Result Date: 11/10/2017 CLINICAL DATA:  Wound at bottom of LEFT foot with redness up to mid calf, history of  cellulitis, diabetes mellitus EXAM: LEFT FOOT - COMPLETE 3+ VIEW COMPARISON:  None FINDINGS: Osseous demineralization diffusely. Scattered narrowing of interphalangeal joints. Remaining joint spaces preserved. Small vessel vascular calcifications consistent with history of diabetes mellitus. No acute fracture, dislocation, or bone destruction. Scattered soft tissue swelling including dorsum of foot. No definite soft tissue gas or radiopaque foreign bodies. IMPRESSION: No acute osseous abnormalities. Electronically Signed   By: Lavonia Dana M.D.   On: 11/10/2017 11:00    EKG: not done   Labs on Admission: I have personally reviewed the available labs and imaging studies at the time of the admission.  Pertinent labs:   Na++ 130; prior 135 in 1/19 K+ 5.7 BUN 32/Creatinine 1.79/GFR 32; 31/1.32/48 in 1/19 Albumin 3.3 Lactate 1.2 WBC 11.2 Hgb 11.2 LDL 61 in 1/19 A1c 6.0 in 1/19  Assessment/Plan Principal Problem:   Cellulitis Active Problems:   Essential hypertension   Chronic kidney disease, stage III (moderate) (HCC)   Diabetes mellitus type 2 with peripheral artery disease (HCC)   PVD (peripheral vascular disease) (HCC)   Chronic anticoagulation   Persistent atrial fibrillation (HCC)   Lower extremity wound infection with cellulitis and PVD -While this may simply be a superficial wound with cellulitis, this has the potential to be a much deeper infection and is concerning for osteomyelitis -Prior ABI in 5/18 showed stable severe stenosis of right anterior tibial artery; he appeared to not have marked disease on the left at that time.  Will repeat. -Will admit, Med Surg -Antibiotics with Rocephin, Flagyl, and Vancomycin per lower extremity foot ulcer order set -With negative X-ray, will need MRI to assess for osteomyelitis -Likely to need consult by vascular and/or orthopedics, depending on ABI and MRI results -Lower extremity foot infection order set utilized, including orders for  CM, SW, wound care, and nutrition consult. -Goal would be for glucose <150 to facilitate wound healing. -Patient should be on bed rest, non-weight bearing. -Excellent BP control is needed  -He was only started yesterday on Augmentin and Bactrim but worsened on it.  DM -09/07/17 A1c was 6.0 -He is diet controlled -Will monitor with AM labs for now  Afib on Eliquis -Rate controlled on Lopressor -Continue Eliquis for now, but if there needs to be intervention for his foot infection this will need to be held or changed  HTN -Continue Norvasc and Lopressor -Hold HCTZ and lisinopril due to renal failure (see below)  CKD -Appears to be at/near baseline -Would hold and consider discontinuation of both HCTZ and lisinopril -Gentle hydration and follow daily BMP for now  DVT prophylaxis: Coumadin Code Status:  Full - confirmed with patient/family.  They have not discussed this as a family and I have strongly encouraged them to discuss it amongst themselves and with [name] once the patient is clinically improved. Family Communication: Wife and son present throughout evaluation  Disposition Plan:  Home once clinically improved Consults called: SM, CM, wound care, nutrition  Admission status: Admit - It is my clinical opinion that admission to INPATIENT is reasonable  and necessary because of the expectation that this patient will require hospital care that crosses at least 2 midnights to treat this condition based on the medical complexity of the problems presented.  Given the aforementioned information, the predictability of an adverse outcome is felt to be significant.    [name] MD [facility]  If note is complete, please contact covering daytime or nighttime physician. [email or web address] Password [email or web address]  11/10/2017, 3:15 PM

## 2017-11-10 NOTE — ED Triage Notes (Signed)
Patient sent from MD office due to worsening cellulitis after starting po antibiotics yesterday

## 2017-11-10 NOTE — Consult Note (Addendum)
[facility] Nurse wound consult note Reason for Consult: Consult requested for left foot and toe.  Reviewed photo in the EMR and progress notes. Wound type: Continue Silvadene for enzymatic debridement of nonviable tissue.Full thickness necrotic area to left great toe, with erythremia and edema extending to the left foot and leg surrounding the wound. X-ray did not indicate osteomyelitis, but sometimes this is not definitive.  Pt could benefit from an ABI to assess perfusion and an ortho consult for possible debridement of nonviable tissue. Called ER nurse to inform  them of need for ortho consult. Please re-consult if further assistance is needed.  Thank-you,  Julien Girt MSN, [place], [place], [place], [place]

## 2017-11-10 NOTE — ED Provider Notes (Signed)
[facility] EMERGENCY DEPARTMENT Provider Note   CSN: [id number] Arrival date & time: 11/10/17  1005     History   Chief Complaint Chief Complaint  Patient presents with  . cellulitis/foot redness    HPI Eric [name]. is a 84 y.o. male.  HPI  83 year old male with a history of atrial fibrillation and flutter, diet-controlled diabetes, peripheral vascular disease presents with foot swelling and redness.  He states overall this is started about 2 weeks ago.  Starts with a wound in his left great toe.  He has had swelling and redness both of his foot as well as his lower leg.  No fevers but has had warmth over the extremity.  Occasionally has drained blood but no other drainage.  He states it is moderately painful.  He saw his doctor yesterday where they wrapped it and placed him on an antibiotic.  He was on Augmentin and Bactrim.  Went to see the PCP for follow-up today and he is having worsening redness so was sent to the ER.  Past Medical History:  Diagnosis Date  . Atrial flutter ([place])    s/p RFCA  . CAD (coronary artery disease)    cath 2003, occluded S-RCA, occluded S-Dx, L-LAD ok, s/p PTCA to LAD  . Cataract   . CKD (chronic kidney disease) stage 3, GFR 30-59 ml/min (HCC)   . Diabetes mellitus    diet controlled  . Hearing aid worn    bilateral  . Long term (current) use of anticoagulants   . Neuromuscular disorder ([place])   . OSA (obstructive sleep apnea) 12/11   very mild, AHI 7/hr  . Persistent atrial fibrillation ([place]) 09/26/2015  . Pure hyperglyceridemia   . PVD (peripheral vascular disease) ([place])    angioplasty of his right lower extremity in [place] by Dr.[name] 2013  . Unspecified essential hypertension   . Wears dentures    full upper and lower    Patient Active Problem List   Diagnosis Date Noted  . Chronic anticoagulation 09/26/2015  . Persistent atrial fibrillation ([place]) 09/26/2015  . Olecranon bursitis 04/23/2014  . Preop  cardiovascular exam 04/13/2013  . Routine general medical examination at a health care facility 10/10/2012  . PVD (peripheral vascular disease) ([place]) 06/02/2012  . Diabetes mellitus type 2 with peripheral artery disease ([place]) 05/17/2012  . Microhematuria 11/16/2011  . BPH (benign prostatic hyperplasia) 11/16/2011  . Chronic kidney disease, stage III (moderate) ([place]) 08/26/2011  . Leg pain 08/26/2011  . Coronary artery disease 03/25/2011  . Long term (current) use of anticoagulants 12/23/2010  . OBSTRUCTIVE SLEEP APNEA 09/15/2010  . PERIODIC LIMB MOVEMENT DISORDER 09/15/2010  . SLEEP APNEA 08/26/2010  . PERSONAL HISTORY OF COLONIC POLYPS 10/25/2009  . VITAMIN B12 DEFICIENCY 03/04/2009  . Diabetes mellitus with neuropathy ([place]) 11/24/2006  . HYPERTRIGLYCERIDEMIA 11/24/2006  . Essential hypertension 11/24/2006    Past Surgical History:  Procedure Laterality Date  . Adenosine Myoview  3/06   EF 56%, neg. Ischemia  . Adenosine Myoview  02/18/07   nml  . ANGIOPLASTY  1/99   CAD- diogonal with rotational artherectomy  . Arthrectomy     of LAD & PTCA  . BLEPHAROPLASTY Bilateral   . CARDIAC CATHETERIZATION  1/00  . CARDIOVERSION  1/04  . CARDIOVERSION  5/07   hospital- a flutter  . CARPAL TUNNEL RELEASE     ? bilateral  . CATARACT EXTRACTION W/PHACO Right 07/28/2017   Procedure: CATARACT EXTRACTION PHACO AND INTRAOCULAR LENS PLACEMENT (  IOC) RIGHT DIABETIC;  Surgeon: [name], MD;  Location: [place];  Service: Ophthalmology;  Laterality: Right;  Diabetic - diet controlled  . CATARACT EXTRACTION W/PHACO Left 08/18/2017   Procedure: CATARACT EXTRACTION PHACO AND INTRAOCULAR LENS PLACEMENT (IOC);  Surgeon: [name], MD;  Location: [place];  Service: Ophthalmology;  Laterality: Left;  DIABETES - oral meds  . COLONOSCOPY W/ BIOPSIES  10/01/06   sigmoid polyp bx neg, 3 years  . CORONARY ANGIOPLASTY  4/03   cutting balloon PTCA pLAD into Diag  .  CORONARY ARTERY BYPASS GRAFT  2000   LIMA-LAD, SVG-RCA, SVG-Diag; SVG-Diag & SVG-RCA occluded 2003  . FRACTURE SURGERY    . HAND SURGERY  08/27/09   R thumb procedure wit Scaphoid Gragt and screws, Dr [name]  . NM MYOVIEW LTD  4/11   normal  . PERIPHERAL VASCULAR CATHETERIZATION Right 06/27/2015   Procedure: Lower Extremity Angiography;  Surgeon: Algernon [name], MD;  Location: [facility];  Service: Cardiovascular;  Laterality: Right;  . PERIPHERAL VASCULAR CATHETERIZATION  06/27/2015   Procedure: Lower Extremity Intervention;  Surgeon: Algernon [name], MD;  Location: [facility];  Service: Cardiovascular;;        Home Medications    Prior to Admission medications   Medication Sig Start Date End Date Taking? Authorizing Provider  acetaminophen (TYLENOL) 500 MG tablet Take 1,000 mg by mouth every 8 (eight) hours as needed for mild pain.     [provider]  amLODipine (NORVASC) 10 MG tablet Take 1 tablet (10 mg total) by mouth daily. 10/11/17   [name], MD  amoxicillin-clavulanate (AUGMENTIN) 875-125 MG tablet Take 1 tablet by mouth 2 (two) times daily. 11/09/17   [name], MD  apixaban (ELIQUIS) 2.5 MG TABS tablet Take 1 tablet (2.5 mg total) by mouth 2 (two) times daily. 11/05/17   [name], MD  B Complex Vitamins (B COMPLEX-B12) TABS Take 1 tablet by mouth daily.     [provider]  Blood Glucose Monitoring Suppl (ONE TOUCH ULTRA SYSTEM KIT) W/DEVICE KIT 1 kit by Does not apply route once. 11/01/14   [name], MD  cholecalciferol (VITAMIN D) 1000 UNITS tablet Take 1,000 Units by mouth 2 (two) times a week. Takes on Mon and Fri    [provider]  CINNAMON PO Take by mouth 2 (two) times daily.     [provider]  fenofibrate 160 MG tablet Take 1 tablet (160 mg total) by mouth daily. 04/23/17   [name], MD  fish oil-omega-3 fatty acids 1000 MG capsule Take 3 g by mouth daily.     [provider]  glucose blood test strip Use as instructed 05/26/17   [name], MD  hydrochlorothiazide (HYDRODIURIL) 25 MG tablet Take 1 tablet (25 mg total) by mouth daily. 11/01/17   [name], MD  lisinopril (PRINIVIL,ZESTRIL) 40 MG tablet Take 1 tablet (40 mg total) by mouth daily. 08/20/17   [name], MD  metoprolol tartrate (LOPRESSOR) 50 MG tablet Take 1 tablet (50 mg total) by mouth 2 (two) times daily. 08/20/17   [name], MD  Multiple Vitamin (DAILY MULTIVITAMIN PO) Take 1 tablet by mouth daily.      [provider]  [facility] DELICA LANCETS FINE MISC check sugar once daily 08/17/17   [name], MD  rosuvastatin (CRESTOR) 40 MG tablet Take 1 tablet (40 mg total) by mouth daily.  08/20/17   [name], MD  sulfamethoxazole-trimethoprim (BACTRIM DS,SEPTRA DS) 800-160 MG tablet Take 1 tablet by mouth 2 (two) times daily. 11/09/17   [name], MD    Family History Family History  Problem Relation Age of Onset  . Stroke Father   . Aneurysm Father   . Hypertension Father   . Prostate cancer Brother   . Hypertension Mother   . Lung cancer Brother        smoker  . Thrombosis Brother   . Other Brother        RF valve disorder (smoker)  . Lung cancer Brother        smoker  . Breast cancer Sister   . Liver cancer Brother   . Other Sister        cerebral hemorrhage    Social History Social History   Tobacco Use  . Smoking status: Never Smoker  . Smokeless tobacco: Former Network engineer Use Topics  . Alcohol use: No  . Drug use: No     Allergies   Isosorbide mononitrate; Ramipril; and Quinidine gluconate   Review of Systems Review of Systems  Constitutional: Negative for fever.  Musculoskeletal: Positive for arthralgias and joint swelling.  Skin: Positive for color change and wound.  All other systems reviewed and are negative.    Physical Exam Updated Vital Signs BP 113/66 (BP Location: Left Arm)   Pulse (!) 59    Temp 97.7 F (36.5 C) (Oral)   Resp 20   SpO2 100%   Physical Exam  Constitutional: He is oriented to person, place, and time. He appears well-developed and well-nourished. No distress.  HENT:  Head: Normocephalic and atraumatic.  Right Ear: External ear normal.  Left Ear: External ear normal.  Nose: Nose normal.  Eyes: Right eye exhibits no discharge. Left eye exhibits no discharge.  Neck: Neck supple.  Cardiovascular: Normal rate and regular rhythm.  Pulses:      Dorsalis pedis pulses are Detected w/ doppler on the right side, and Detected w/ doppler on the left side.  Pulmonary/Chest: Effort normal.  Musculoskeletal: He exhibits edema.  Left foot with dorsal cellulitis. Mild erythema to anterior shin. Diffuse symmetric swelling of LLE below knee. Black covering to left great toe.  Feet:  Left Foot:  Skin Integrity: Positive for skin breakdown and erythema.  Neurological: He is alert and oriented to person, place, and time.  Skin: Skin is warm and dry. He is not diaphoretic.  Nursing note and vitals reviewed.        ED Treatments / Results  Labs (all labs ordered are listed, but only abnormal results are displayed) Labs Reviewed  COMPREHENSIVE METABOLIC PANEL - Abnormal; Notable for the following components:      Result Value   Sodium 130 (*)    Potassium 5.7 (*)    Chloride 98 (*)    BUN 32 (*)    Creatinine, Ser 1.79 (*)    Albumin 3.3 (*)    GFR calc non Af Amer 32 (*)    GFR calc Af Amer 38 (*)    All other components within normal limits  CBC WITH DIFFERENTIAL/PLATELET - Abnormal; Notable for the following components:   WBC 11.2 (*)    RBC 3.67 (*)    Hemoglobin 11.2 (*)    HCT 34.2 (*)    All other components within normal limits  CULTURE, BLOOD (ROUTINE X 2)  CULTURE, BLOOD (ROUTINE X 2)  URINALYSIS, ROUTINE W  REFLEX MICROSCOPIC  I-STAT CG4 LACTIC ACID, ED    EKG None  Radiology Dg Foot Complete Left  Result Date: 11/10/2017 CLINICAL DATA:   Wound at bottom of LEFT foot with redness up to mid calf, history of cellulitis, diabetes mellitus EXAM: LEFT FOOT - COMPLETE 3+ VIEW COMPARISON:  None FINDINGS: Osseous demineralization diffusely. Scattered narrowing of interphalangeal joints. Remaining joint spaces preserved. Small vessel vascular calcifications consistent with history of diabetes mellitus. No acute fracture, dislocation, or bone destruction. Scattered soft tissue swelling including dorsum of foot. No definite soft tissue gas or radiopaque foreign bodies. IMPRESSION: No acute osseous abnormalities. Electronically Signed   By: Lavonia Dana M.D.   On: 11/10/2017 11:00    Procedures Procedures (including critical care time)  Medications Ordered in ED Medications  piperacillin-tazobactam (ZOSYN) IVPB 3.375 g (3.375 g Intravenous New Bag/Given 11/10/17 1313)  sodium chloride 0.9 % bolus 1,000 mL (1,000 mLs Intravenous New Bag/Given 11/10/17 1313)  vancomycin (VANCOCIN) 1,250 mg in sodium chloride 0.9 % 250 mL IVPB (has no administration in time range)  vancomycin (VANCOCIN) IVPB 750 mg/150 ml premix (has no administration in time range)     Initial Impression / Assessment and Plan / ED Course  I have reviewed the triage vital signs and the nursing notes.  Pertinent labs & imaging results that were available during my care of the patient were reviewed by me and considered in my medical decision making (see chart for details).     Vital signs are stable.  The patient is in no distress and his initial lactic acid is normal.  However he does have a mild bump in his WBC as well as a bump in his creatinine with some mild hyperkalemia.  His foot appears obviously infected, concerned that this could be coming from his toe given the black covering.  Initial x-ray negative but will need further workup for osteomyelitis and IV antibiotics.  Discussed with Dr. Lorin Mercy who will admit to the hospitalist service.  Final Clinical Impressions(s) / ED  Diagnoses   Final diagnoses:  Cellulitis of left foot  Acute kidney injury [facility])  Hyperkalemia    ED Discharge Orders    None       Sherwood Gambler, MD 11/10/17 1319

## 2017-11-11 ENCOUNTER — Inpatient Hospital Stay (HOSPITAL_COMMUNITY): Payer: PPO

## 2017-11-11 ENCOUNTER — Ambulatory Visit: Payer: PPO | Admitting: Family Medicine

## 2017-11-11 DIAGNOSIS — E875 Hyperkalemia: Secondary | ICD-10-CM

## 2017-11-11 DIAGNOSIS — L03116 Cellulitis of left lower limb: Secondary | ICD-10-CM

## 2017-11-11 DIAGNOSIS — I739 Peripheral vascular disease, unspecified: Secondary | ICD-10-CM

## 2017-11-11 DIAGNOSIS — I96 Gangrene, not elsewhere classified: Secondary | ICD-10-CM

## 2017-11-11 DIAGNOSIS — I481 Persistent atrial fibrillation: Secondary | ICD-10-CM

## 2017-11-11 DIAGNOSIS — I10 Essential (primary) hypertension: Secondary | ICD-10-CM

## 2017-11-11 DIAGNOSIS — Z7901 Long term (current) use of anticoagulants: Secondary | ICD-10-CM

## 2017-11-11 DIAGNOSIS — N179 Acute kidney failure, unspecified: Secondary | ICD-10-CM

## 2017-11-11 DIAGNOSIS — E1151 Type 2 diabetes mellitus with diabetic peripheral angiopathy without gangrene: Secondary | ICD-10-CM

## 2017-11-11 DIAGNOSIS — N183 Chronic kidney disease, stage 3 (moderate): Secondary | ICD-10-CM

## 2017-11-11 DIAGNOSIS — L0390 Cellulitis, unspecified: Secondary | ICD-10-CM

## 2017-11-11 LAB — BASIC METABOLIC PANEL
ANION GAP: 9 (ref 5–15)
BUN: 27 mg/dL — AB (ref 6–20)
CALCIUM: 8.7 mg/dL — AB (ref 8.9–10.3)
CO2: 19 mmol/L — AB (ref 22–32)
CREATININE: 1.71 mg/dL — AB (ref 0.61–1.24)
Chloride: 106 mmol/L (ref 101–111)
GFR calc Af Amer: 40 mL/min — ABNORMAL LOW (ref >60)
GFR, EST NON AFRICAN AMERICAN: 34 mL/min — AB (ref >60)
GLUCOSE: 86 mg/dL (ref 65–99)
Potassium: 4.9 mmol/L (ref 3.5–5.1)
Sodium: 134 mmol/L — ABNORMAL LOW (ref 135–145)

## 2017-11-11 LAB — GLUCOSE, CAPILLARY
GLUCOSE-CAPILLARY: 81 mg/dL (ref 65–99)
GLUCOSE-CAPILLARY: 90 mg/dL (ref 65–99)
GLUCOSE-CAPILLARY: 108 mg/dL — AB (ref 65–99)
Glucose-Capillary: 138 mg/dL — ABNORMAL HIGH (ref 65–99)

## 2017-11-11 LAB — HIV ANTIBODY (ROUTINE TESTING W REFLEX): HIV SCREEN 4TH GENERATION: NONREACTIVE

## 2017-11-11 LAB — CBC
HCT: 31.8 % — ABNORMAL LOW (ref 39.0–52.0)
Hemoglobin: 10.6 g/dL — ABNORMAL LOW (ref 13.0–17.0)
MCH: 31 pg (ref 26.0–34.0)
MCHC: 33.3 g/dL (ref 30.0–36.0)
MCV: 93 fL (ref 78.0–100.0)
PLATELETS: 206 10*3/uL (ref 150–400)
RBC: 3.42 MIL/uL — ABNORMAL LOW (ref 4.22–5.81)
RDW: 15.3 % (ref 11.5–15.5)
WBC: 10.2 10*3/uL (ref 4.0–10.5)

## 2017-11-11 LAB — APTT: aPTT: 40 seconds — ABNORMAL HIGH (ref 24–36)

## 2017-11-11 LAB — HEPARIN LEVEL (UNFRACTIONATED): Heparin Unfractionated: 0.84 IU/mL — ABNORMAL HIGH (ref 0.30–0.70)

## 2017-11-11 MED ORDER — ADULT MULTIVITAMIN LIQUID CH
15.0000 mL | Freq: Every day | ORAL | Status: DC
  Administered 2017-11-11 – 2017-11-16 (×5): 15 mL via ORAL
  Filled 2017-11-11 (×6): qty 15

## 2017-11-11 MED ORDER — JUVEN PO PACK
1.0000 | PACK | Freq: Two times a day (BID) | ORAL | Status: DC
  Administered 2017-11-11 – 2017-11-16 (×7): 1 via ORAL
  Filled 2017-11-11 (×10): qty 1

## 2017-11-11 MED ORDER — SODIUM CHLORIDE 0.9% FLUSH
10.0000 mL | Freq: Two times a day (BID) | INTRAVENOUS | Status: DC
  Administered 2017-11-16: 10 mL

## 2017-11-11 MED ORDER — HEPARIN (PORCINE) IN NACL 100-0.45 UNIT/ML-% IJ SOLN
1400.0000 [IU]/h | INTRAMUSCULAR | Status: DC
  Administered 2017-11-11 – 2017-11-12 (×2): 1050 [IU]/h via INTRAVENOUS
  Administered 2017-11-14: 1400 [IU]/h via INTRAVENOUS
  Administered 2017-11-14: 1450 [IU]/h via INTRAVENOUS
  Filled 2017-11-11 (×6): qty 250

## 2017-11-11 MED ORDER — SODIUM CHLORIDE 0.9% FLUSH
10.0000 mL | INTRAVENOUS | Status: DC | PRN
  Administered 2017-11-13: 10 mL
  Filled 2017-11-11: qty 40

## 2017-11-11 NOTE — Clinical Social Work Note (Signed)
CSW received consult for access meds for discharge. Inappropriate consult for social work. Nurse case manager will be advised of request. [place] signing off as this consult will be handed off to RN case manager.  Cieanna Stormes Givens, MSW, LCSW Licensed Clinical Social Worker [place] [phone]

## 2017-11-11 NOTE — Progress Notes (Signed)
PROGRESS NOTE    [name]  QGB:[id number] DOB: February 01, 1928 DOA: 11/10/2017 PCP: [name], MD   Brief Narrative:  HPI on 11/10/2017 by [name] [name] Rayquan Amrhein. is a 84 y.o. male with medical history significant of HTNl HLD; PVD s/p RLE angioplasty; afib on AC; OSA; DM; stage 3 CKD; and CAD s/p PCI presenting with left foot cellulitis.  When asked why he came to the hospital, he said, "The doctor said I needed to".  He has a sore on his foot and "it's real red, raised, and they're afraid it's gonna go in deep if he don't get something done about it".  It's been there "a while".  No fevers.  He feels well otherwise.  He previously had an infected blister on his right foot for which he was followed by podiatry.  He was seen yesterday by [name] for this issue and started on Bactrim and Augmentin.  He was seen again today by [name] and sent to the ER due to progressive infection.  Assessment & Plan   Left lower extremity colitis and wound infection/PVD -Patient was placed on oral antibiotics, Augmentin and Bactrim prior to admission.  He states that his wound has been there for several weeks.  Patient was seeing a foot doctor for his right foot previously. -Left foot x-ray showed no acute osseous abnormalities -Obtained MRI without contrast of the left foot: Showing no septic arthritis or osteomyelitis.  Cellulitis without discrete drainable soft tissue abscess, mild fasciitis or pyomyositis -Currently on vancomycin, Flagyl, ceftriaxone -CRP 7.1, ESR 78 -ABI R 0.91, L 0.89 -Given wound appearance, have insulted orthopedics for further evaluation -Cellulitis does appear to be improving  Diabetes mellitus, type II -Currently diet controlled, last hemoglobin A1c was 6 on 09/07/2017  Atrial fibrillation -Eliquis currently held, will place on heparin in case of possible surgical intervention -Currently rate controlled, continue Lopressor  Essential  hypertension -Continue metoprolol, amlodipine -Lisinopril and HCTZ currently held  Chronic kidney disease, stage III -Creatinine currently within stage III, continue to monitor BMP  DVT Prophylaxis  heparin  Code Status: Full  Family Communication: Wife at bedside.  Disposition Plan: Admitted. Pending further improvement in cellulitis as well as evaluation by orthopedics  Consultants Orthopedic surgery  Procedures  ABI  Antibiotics   Anti-infectives (From admission, onward)   Start     Dose/Rate Route Frequency Ordered Stop   11/11/17 1300  vancomycin (VANCOCIN) IVPB 750 mg/150 ml premix     750 mg 150 mL/hr over 60 Minutes Intravenous Every 24 hours 11/10/17 1229     11/10/17 1830  cefTRIAXone (ROCEPHIN) 2 g in sodium chloride 0.9 % 100 mL IVPB     2 g 200 mL/hr over 30 Minutes Intravenous Every 24 hours 11/10/17 1737     11/10/17 1830  metroNIDAZOLE (FLAGYL) IVPB 500 mg     500 mg 100 mL/hr over 60 Minutes Intravenous Every 8 hours 11/10/17 1737     11/10/17 1300  vancomycin (VANCOCIN) 1,250 mg in sodium chloride 0.9 % 250 mL IVPB     1,250 mg 166.7 mL/hr over 90 Minutes Intravenous  Once 11/10/17 1229 11/10/17 1527   11/10/17 1215  piperacillin-tazobactam (ZOSYN) IVPB 3.375 g     3.375 g 100 mL/hr over 30 Minutes Intravenous  Once 11/10/17 1211 11/10/17 1356      Subjective:   [name] seen and examined today.  Patient states the pain in his left leg and foot have  improved.  Feels the redness is also improved mildly.  Denies current chest pain, shortness breath, abdominal pain, nausea vomiting, diarrhea constipation.  Objective:   Vitals:   11/10/17 1733 11/10/17 2122 11/11/17 0530 11/11/17 1258  BP: (!) 155/95 128/64 (!) 149/71 (!) 146/60  Pulse: 67 (!) 54 (!) 56 62  Resp: '20 18 18   ' Temp: 98.5 F (36.9 C) 99.3 F (37.4 C) 97.9 F (36.6 C)   TempSrc: Oral Oral Oral   SpO2: 100% 99% 100% 99%  Weight: 79 kg (174 lb 2.6 oz) 78.8 kg (173 lb 11.6 oz)     Height: 5' 5.2" (1.656 m)       Intake/Output Summary (Last 24 hours) at 11/11/2017 1611 Last data filed at 11/11/2017 1419 Gross per 24 hour  Intake 1223.33 ml  Output 0 ml  Net 1223.33 ml   Filed Weights   11/10/17 1733 11/10/17 2122  Weight: 79 kg (174 lb 2.6 oz) 78.8 kg (173 lb 11.6 oz)    Exam  General: Well developed, well nourished, NAD, appears stated age  HEENT: NCAT, mucous membranes moist.   Neck: Supple  Cardiovascular: S1 S2 auscultated, no rubs, murmurs or gallops. Regular rate and rhythm.  Respiratory: Clear to auscultation bilaterally with equal chest rise  Abdomen: Soft, nontender, nondistended, + bowel sounds  Extremities: warm dry without cyanosis clubbing.  L Great toe wound with surrounding erythema  Neuro: AAOx3, nonfocal  Skin: Erythema noted on LLE, currently marked  Psych: Normal affect and demeanor with intact judgement and insight  Data Reviewed: I have personally reviewed following labs and imaging studies  CBC: Recent Labs  Lab 11/10/17 1036 11/11/17 0512  WBC 11.2* 10.2  NEUTROABS 7.3  --   HGB 11.2* 10.6*  HCT 34.2* 31.8*  MCV 93.2 93.0  PLT 221 073   Basic Metabolic Panel: Recent Labs  Lab 11/10/17 1036 11/11/17 0512  NA 130* 134*  K 5.7* 4.9  CL 98* 106  CO2 23 19*  GLUCOSE 87 86  BUN 32* 27*  CREATININE 1.79* 1.71*  CALCIUM 9.4 8.7*   GFR: Estimated Creatinine Clearance: 29.6 mL/min (A) (by C-G formula based on SCr of 1.71 mg/dL (H)). Liver Function Tests: Recent Labs  Lab 11/10/17 1036  AST 38  ALT 19  ALKPHOS 38  BILITOT 0.8  PROT 6.9  ALBUMIN 3.3*   No results for input(s): LIPASE, AMYLASE in the last 168 hours. No results for input(s): AMMONIA in the last 168 hours. Coagulation Profile: No results for input(s): INR, PROTIME in the last 168 hours. Cardiac Enzymes: No results for input(s): CKTOTAL, CKMB, CKMBINDEX, TROPONINI in the last 168 hours. BNP (last 3 results) No results for input(s): PROBNP  in the last 8760 hours. HbA1C: No results for input(s): HGBA1C in the last 72 hours. CBG: Recent Labs  Lab 11/10/17 1731 11/10/17 2118 11/11/17 0745 11/11/17 1115  GLUCAP 94 119* 81 90   Lipid Profile: No results for input(s): CHOL, HDL, LDLCALC, TRIG, CHOLHDL, LDLDIRECT in the last 72 hours. Thyroid Function Tests: No results for input(s): TSH, T4TOTAL, FREET4, T3FREE, THYROIDAB in the last 72 hours. Anemia Panel: No results for input(s): VITAMINB12, FOLATE, FERRITIN, TIBC, IRON, RETICCTPCT in the last 72 hours. Urine analysis:    Component Value Date/Time   COLORURINE YELLOW 11/10/2017 [place] 11/10/2017 1545   LABSPEC 1.009 11/10/2017 1545   PHURINE 6.0 11/10/2017 1545   GLUCOSEU NEGATIVE 11/10/2017 1545   HGBUR NEGATIVE 11/10/2017 1545   HGBUR trace-lysed  02/07/2008 [place] 11/10/2017 [place] 11/10/2017 1545   PROTEINUR NEGATIVE 11/10/2017 1545   UROBILINOGEN 0.2 02/07/2008 1101   NITRITE NEGATIVE 11/10/2017 1545   LEUKOCYTESUR NEGATIVE 11/10/2017 1545   Sepsis Labs: '@LABRCNTIP' (procalcitonin:4,lacticidven:4)  ) Recent Results (from the past 240 hour(s))  Blood culture (routine x 2)     Status: None (Preliminary result)   Collection Time: 11/10/17  1:08 PM  Result Value Ref Range Status   Specimen Description BLOOD RIGHT ANTECUBITAL  Final   Special Requests   Final    BOTTLES DRAWN AEROBIC AND ANAEROBIC Blood Culture adequate volume   Culture   Final    NO GROWTH < 24 HOURS Performed at [facility], [place] [address] [place], [place] [address]    Report Status PENDING  Incomplete  Blood culture (routine x 2)     Status: None (Preliminary result)   Collection Time: 11/10/17  1:08 PM  Result Value Ref Range Status   Specimen Description BLOOD BLOOD RIGHT WRIST  Final   Special Requests   Final    BOTTLES DRAWN AEROBIC ONLY Blood Culture adequate volume   Culture   Final    NO GROWTH < 24  HOURS Performed at [facility], [place] [address], [place],  [address]    Report Status PENDING  Incomplete      Radiology Studies: Mr Foot Left Wo Contrast  Result Date: 11/11/2017 CLINICAL DATA:  Pain and swelling.  Great toe ulcer. EXAM: MRI OF THE LEFT FOOT WITHOUT CONTRAST TECHNIQUE: Multiplanar, multisequence MR imaging of the left foot was performed. No intravenous contrast was administered. COMPARISON:  Left foot radiographs 11/10/2017 FINDINGS: Examination is quite limited by patient motion. Subcutaneous soft tissue swelling/edema suggesting cellulitis. No findings for myofasciitis or pyomyositis. No discrete drainable fluid collection is identified. No definite findings for septic arthritis or osteomyelitis. IMPRESSION: Very limited examination but no definite MR findings to suggest septic arthritis or osteomyelitis. Cellulitis without discrete drainable soft tissue abscess, myofasciitis or pyomyositis. Electronically Signed   By: Marijo Sanes M.D.   On: 11/11/2017 11:06   Dg Foot Complete Left  Result Date: 11/10/2017 CLINICAL DATA:  Wound at bottom of LEFT foot with redness up to mid calf, history of cellulitis, diabetes mellitus EXAM: LEFT FOOT - COMPLETE 3+ VIEW COMPARISON:  None FINDINGS: Osseous demineralization diffusely. Scattered narrowing of interphalangeal joints. Remaining joint spaces preserved. Small vessel vascular calcifications consistent with history of diabetes mellitus. No acute fracture, dislocation, or bone destruction. Scattered soft tissue swelling including dorsum of foot. No definite soft tissue gas or radiopaque foreign bodies. IMPRESSION: No acute osseous abnormalities. Electronically Signed   By: Lavonia Dana M.D.   On: 11/10/2017 11:00     Scheduled Meds: . amLODipine  10 mg Oral Daily  . docusate sodium  100 mg Oral BID  . metoprolol tartrate  50 mg Oral BID  . multivitamin  15 mL Oral Daily  . nutrition supplement (JUVEN)  1 packet Oral BID BM   . rosuvastatin  40 mg Oral Daily  . silver sulfADIAZINE   Topical Daily   Continuous Infusions: . cefTRIAXone (ROCEPHIN)  IV Stopped (11/10/17 2151)  . heparin 1,050 Units/hr (11/11/17 1606)  . lactated ringers 50 mL/hr at 11/10/17 1844  . metronidazole Stopped (11/11/17 1606)  . vancomycin Stopped (11/11/17 1606)     LOS: 1 day   Time Spent in minutes   30 minutes  Augusta Mirkin D.O. on  11/11/2017 at 4:11 PM  Between 7am to 7pm - Pager - [phone]  After 7pm go to [email or web address] - password [email or web address]  And look for the night coverage person covering for me after hours  [facility] Office  [phone]

## 2017-11-11 NOTE — Progress Notes (Signed)
ANTICOAGULATION CONSULT NOTE - Initial Consult  Pharmacy Consult for Heparin (while Apixaban held) Indication:  H/o Afib   Allergies  Allergen Reactions  . Isosorbide Mononitrate Other (See Comments)    Unknown, doesn't remember   . Ramipril Cough  . Quinidine Gluconate Rash    Patient Measurements: Height: 5' 5.2" (165.6 cm) Weight: 173 lb 11.6 oz (78.8 kg) IBW/kg (Calculated) : 61.96 Heparin Dosing Weight: 77.8 kg  Vital Signs: Temp: 97.9 F (36.6 C) (04/04 0530) Temp Source: Oral (04/04 0530) BP: 146/60 (04/04 1258) Pulse Rate: 62 (04/04 1258)  Labs: Recent Labs    11/10/17 1036 11/11/17 0512 11/11/17 1309  HGB 11.2* 10.6*  --   HCT 34.2* 31.8*  --   PLT 221 206  --   HEPARINUNFRC  --   --  0.84*  CREATININE 1.79* 1.71*  --     Estimated Creatinine Clearance: 29.6 mL/min (A) (by C-G formula based on SCr of 1.71 mg/dL (H)).   Medical History: Past Medical History:  Diagnosis Date  . Atrial flutter ([place])    s/p RFCA  . CAD (coronary artery disease)    cath 2003, occluded S-RCA, occluded S-Dx, L-LAD ok, s/p PTCA to LAD  . Cataract   . CKD (chronic kidney disease) stage 3, GFR 30-59 ml/min (HCC)   . Diabetes mellitus    diet controlled  . Hearing aid worn    bilateral  . Long term (current) use of anticoagulants   . Neuromuscular disorder ([place])   . OSA (obstructive sleep apnea) 12/11   very mild, AHI 7/hr  . Persistent atrial fibrillation ([place]) 09/26/2015  . Pure hyperglyceridemia   . PVD (peripheral vascular disease) ([place])    angioplasty of his right lower extremity in [place] by Dr.[name] 2013  . Unspecified essential hypertension   . Wears dentures    full upper and lower    Medications:  Medications Prior to Admission  Medication Sig Dispense Refill Last Dose  . acetaminophen (TYLENOL) 500 MG tablet Take 1,000 mg by mouth every 8 (eight) hours as needed for mild pain.    11/09/2017 at Unknown time  . amLODipine (NORVASC) 10 MG tablet Take 1  tablet (10 mg total) by mouth daily. 90 tablet 3 11/10/2017 at Unknown time  . amoxicillin-clavulanate (AUGMENTIN) 875-125 MG tablet Take 1 tablet by mouth 2 (two) times daily. 20 tablet 0 11/09/2017 at Unknown time  . apixaban (ELIQUIS) 2.5 MG TABS tablet Take 1 tablet (2.5 mg total) by mouth 2 (two) times daily. 180 tablet 1 11/10/2017 at 8a  . B Complex Vitamins (B COMPLEX-B12) TABS Take 1 tablet by mouth daily.    11/09/2017 at Unknown time  . Blood Glucose Monitoring Suppl (ONE TOUCH ULTRA SYSTEM KIT) W/DEVICE KIT 1 kit by Does not apply route once. 1 each 0 11/09/2017 at Unknown time  . cholecalciferol (VITAMIN D) 1000 UNITS tablet Take 1,000 Units by mouth 2 (two) times a week. Takes on Mon and Fri   11/09/2017 at Unknown time  . CINNAMON PO Take by mouth 2 (two) times daily.    11/09/2017 at Unknown time  . fenofibrate 160 MG tablet Take 1 tablet (160 mg total) by mouth daily. 90 tablet 2 11/09/2017 at Unknown time  . fish oil-omega-3 fatty acids 1000 MG capsule Take 3 g by mouth daily.    11/09/2017 at Unknown time  . glucose blood test strip Use as instructed 100 each 3 Past Week at Unknown time  . hydrochlorothiazide (HYDRODIURIL) 25 MG  tablet Take 1 tablet (25 mg total) by mouth daily. 90 tablet 3 11/09/2017 at Unknown time  . lisinopril (PRINIVIL,ZESTRIL) 40 MG tablet Take 1 tablet (40 mg total) by mouth daily. 90 tablet 3 11/09/2017 at Unknown time  . metoprolol tartrate (LOPRESSOR) 50 MG tablet Take 1 tablet (50 mg total) by mouth 2 (two) times daily. 180 tablet 3 11/09/2017 at 8a  . Multiple Vitamin (DAILY MULTIVITAMIN PO) Take 1 tablet by mouth daily.     11/09/2017 at Unknown time  . ONETOUCH DELICA LANCETS FINE MISC check sugar once daily 100 each 2 11/09/2017 at Unknown time  . rosuvastatin (CRESTOR) 40 MG tablet Take 1 tablet (40 mg total) by mouth daily. 90 tablet 2 11/09/2017 at Unknown time  . sulfamethoxazole-trimethoprim (BACTRIM DS,SEPTRA DS) 800-160 MG tablet Take 1 tablet by mouth 2 (two) times daily.  14 tablet 0 11/09/2017 at Unknown time    Assessment: 84 y.o male admitted 11/10/17 with left foot cellulitis.  He takes apixaban prior to admission for h/o Afib.  Apixaban last given on 4/3 at 21:23 on admit. Now Apixaban held for possible need for surgery for L foot infection. Ortho consult pending. H/o CKD stage 3, Scr 1.71, CrCl ~ 29 ml/min.   STAT heparin level (baseline) = 0.84,  Elevated 2/2 on Apixaban PTA.  STAT aPTT pending We will use aPTTs to monitor/adjust heparin drip rate until effect of apixaban on heparin levels has stopped.  Goal of Therapy:  aPTT 66-102 seconds Monitor platelets by anticoagulation protocol: Yes   Plan:  Check baseline PTT and heparin level -drawn,aPTT pending. Start IV heparin drip at 1050 units/hr (no bolus) Check aPTT 8 hours after heparin started.  Daily aPTT and Heparin level   Nicole Cella, RPh Clinical Pharmacist Pager: [phone] 11/11/2017,1:44 PM

## 2017-11-11 NOTE — Progress Notes (Signed)
VASCULAR LAB PRELIMINARY    ABI completed:    RIGHT    LEFT    PRESSURE WAVEFORM  PRESSURE WAVEFORM  BRACHIAL 150 Triphasic BRACHIAL 151 triphasic  DP 137 biphasic DP 134 monophasic  AT   AT    PT 136 biphasic PT 95 monophasic  PER   PER    GREAT TOE  NA GREAT TOE  NA    RIGHT LEFT  ABI 0.91 0.89     [name]  Eric [name], RVT 11/11/2017, 10:52 AM

## 2017-11-11 NOTE — Consult Note (Signed)
ORTHOPAEDIC CONSULTATION  REQUESTING PHYSICIAN: Cristal Ford, DO  Chief Complaint: Cellulitis with gangrenous ulcer left great toe.  HPI: Eric [name]. is a 84 y.o. male who presents with history of peripheral vascular disease with increasing swelling redness and black gangrenous changes to the medial border of the left great toe.  Patient reports a history that he did have popliteal angioplasty performed in [place] which took his ABI from 40% to 80%.  Patient currently has an ABI that was obtained in the hospital which states his ABI is approximately 89% with monophasic flow.  Past Medical History:  Diagnosis Date  . Atrial flutter ([place])    s/p RFCA  . CAD (coronary artery disease)    cath 2003, occluded S-RCA, occluded S-Dx, L-LAD ok, s/p PTCA to LAD  . Cataract   . CKD (chronic kidney disease) stage 3, GFR 30-59 ml/min (HCC)   . Diabetes mellitus    diet controlled  . Hearing aid worn    bilateral  . Long term (current) use of anticoagulants   . Neuromuscular disorder ([place])   . OSA (obstructive sleep apnea) 12/11   very mild, AHI 7/hr  . Persistent atrial fibrillation ([place]) 09/26/2015  . Pure hyperglyceridemia   . PVD (peripheral vascular disease) ([place])    angioplasty of his right lower extremity in [place] by Dr.[name] 2013  . Unspecified essential hypertension   . Wears dentures    full upper and lower   Past Surgical History:  Procedure Laterality Date  . Adenosine Myoview  3/06   EF 56%, neg. Ischemia  . Adenosine Myoview  02/18/07   nml  . ANGIOPLASTY  1/99   CAD- diogonal with rotational artherectomy  . Arthrectomy     of LAD & PTCA  . BLEPHAROPLASTY Bilateral   . CARDIAC CATHETERIZATION  1/00  . CARDIOVERSION  1/04  . CARDIOVERSION  5/07   hospital- a flutter  . CARPAL TUNNEL RELEASE     ? bilateral  . CATARACT EXTRACTION W/PHACO Right 07/28/2017   Procedure: CATARACT EXTRACTION PHACO AND INTRAOCULAR LENS PLACEMENT ([place]) RIGHT DIABETIC;   Surgeon: [name], MD;  Location: [facility];  Service: Ophthalmology;  Laterality: Right;  Diabetic - diet controlled  . CATARACT EXTRACTION W/PHACO Left 08/18/2017   Procedure: CATARACT EXTRACTION PHACO AND INTRAOCULAR LENS PLACEMENT ([facility]);  Surgeon: [name], MD;  Location: [facility];  Service: Ophthalmology;  Laterality: Left;  DIABETES - oral meds  . COLONOSCOPY W/ BIOPSIES  10/01/06   sigmoid polyp bx neg, 3 years  . CORONARY ANGIOPLASTY  4/03   cutting balloon PTCA pLAD into Diag  . CORONARY ARTERY BYPASS GRAFT  2000   LIMA-LAD, SVG-RCA, SVG-Diag; SVG-Diag & SVG-RCA occluded 2003  . FRACTURE SURGERY    . HAND SURGERY  08/27/09   R thumb procedure wit Scaphoid Gragt and screws, Dr [name]  . NM MYOVIEW LTD  4/11   normal  . PERIPHERAL VASCULAR CATHETERIZATION Right 06/27/2015   Procedure: Lower Extremity Angiography;  Surgeon: Algernon [name], MD;  Location: [facility];  Service: Cardiovascular;  Laterality: Right;  . PERIPHERAL VASCULAR CATHETERIZATION  06/27/2015   Procedure: Lower Extremity Intervention;  Surgeon: Algernon [name], MD;  Location: [facility];  Service: Cardiovascular;;   Social History   Socioeconomic History  . Marital status: Married    Spouse name: Not on file  . Number of children: 5  . Years of education: Not on file  . Highest  education level: Not on file  Occupational History  . Occupation: [employer]    Employer: RETIRED  Social Needs  . Financial resource strain: Not on file  . Food insecurity:    Worry: Not on file    Inability: Not on file  . Transportation needs:    Medical: Not on file    Non-medical: Not on file  Tobacco Use  . Smoking status: Never Smoker  . Smokeless tobacco: Former Network engineer and Sexual Activity  . Alcohol use: No  . Drug use: No  . Sexual activity: Never  Lifestyle  . Physical activity:    Days per week: Not on file    Minutes per session: Not on file    . Stress: Not on file  Relationships  . Social connections:    Talks on phone: Not on file    Gets together: Not on file    Attends religious service: Not on file    Active member of club or organization: Not on file    Attends meetings of clubs or organizations: Not on file    Relationship status: Not on file  Other Topics Concern  . Not on file  Social History Narrative   Retired now does some antiques   Retired from Theatre manager and dye work   Married 1951   5 kids   Family History  Problem Relation Age of Onset  . Stroke Father   . Aneurysm Father   . Hypertension Father   . Prostate cancer Brother   . Hypertension Mother   . Lung cancer Brother        smoker  . Thrombosis Brother   . Other Brother        RF valve disorder (smoker)  . Lung cancer Brother        smoker  . Breast cancer Sister   . Liver cancer Brother   . Other Sister        cerebral hemorrhage   - negative except otherwise stated in the family history section Allergies  Allergen Reactions  . Isosorbide Mononitrate Other (See Comments)    Unknown, doesn't remember   . Ramipril Cough  . Quinidine Gluconate Rash   Prior to Admission medications   Medication Sig Start Date End Date Taking? Authorizing Provider  acetaminophen (TYLENOL) 500 MG tablet Take 1,000 mg by mouth every 8 (eight) hours as needed for mild pain.    Yes [provider]  amLODipine (NORVASC) 10 MG tablet Take 1 tablet (10 mg total) by mouth daily. 10/11/17  Yes [name], MD  amoxicillin-clavulanate (AUGMENTIN) 875-125 MG tablet Take 1 tablet by mouth 2 (two) times daily. 11/09/17  Yes [name], MD  apixaban (ELIQUIS) 2.5 MG TABS tablet Take 1 tablet (2.5 mg total) by mouth 2 (two) times daily. 11/05/17  Yes [name], MD  B Complex Vitamins (B COMPLEX-B12) TABS Take 1 tablet by mouth daily.    Yes [provider]  Blood Glucose Monitoring Suppl (ONE TOUCH ULTRA SYSTEM KIT) W/DEVICE KIT 1 kit by  Does not apply route once. 11/01/14  Yes [name], MD  cholecalciferol (VITAMIN D) 1000 UNITS tablet Take 1,000 Units by mouth 2 (two) times a week. Takes on Mon and Fri   Yes [provider]  CINNAMON PO Take by mouth 2 (two) times daily.    Yes [provider]  fenofibrate 160 MG tablet Take 1 tablet (160 mg total) by mouth  daily. 04/23/17  Yes [name], MD  fish oil-omega-3 fatty acids 1000 MG capsule Take 3 g by mouth daily.    Yes [provider]  glucose blood test strip Use as instructed 05/26/17  Yes Pickard, Cammie Mcgee, MD  hydrochlorothiazide (HYDRODIURIL) 25 MG tablet Take 1 tablet (25 mg total) by mouth daily. 11/01/17  Yes [name], MD  lisinopril (PRINIVIL,ZESTRIL) 40 MG tablet Take 1 tablet (40 mg total) by mouth daily. 08/20/17  Yes [name], MD  metoprolol tartrate (LOPRESSOR) 50 MG tablet Take 1 tablet (50 mg total) by mouth 2 (two) times daily. 08/20/17  Yes [name], MD  Multiple Vitamin (DAILY MULTIVITAMIN PO) Take 1 tablet by mouth daily.     Yes [provider]  [facility] DELICA LANCETS FINE MISC check sugar once daily 08/17/17  Yes [name], MD  rosuvastatin (CRESTOR) 40 MG tablet Take 1 tablet (40 mg total) by mouth daily. 08/20/17  Yes [name], MD  sulfamethoxazole-trimethoprim (BACTRIM DS,SEPTRA DS) 800-160 MG tablet Take 1 tablet by mouth 2 (two) times daily. 11/09/17  Yes [name], MD   Mr Foot Left Wo Contrast  Result Date: 11/11/2017 CLINICAL DATA:  Pain and swelling.  Great toe ulcer. EXAM: MRI OF THE LEFT FOOT WITHOUT CONTRAST TECHNIQUE: Multiplanar, multisequence MR imaging of the left foot was performed. No intravenous contrast was administered. COMPARISON:  Left foot radiographs 11/10/2017 FINDINGS: Examination is quite limited by patient motion. Subcutaneous soft tissue swelling/edema suggesting cellulitis. No findings for myofasciitis or pyomyositis. No discrete  drainable fluid collection is identified. No definite findings for septic arthritis or osteomyelitis. IMPRESSION: Very limited examination but no definite MR findings to suggest septic arthritis or osteomyelitis. Cellulitis without discrete drainable soft tissue abscess, myofasciitis or pyomyositis. Electronically Signed   By: Marijo Sanes M.D.   On: 11/11/2017 11:06   Dg Foot Complete Left  Result Date: 11/10/2017 CLINICAL DATA:  Wound at bottom of LEFT foot with redness up to mid calf, history of cellulitis, diabetes mellitus EXAM: LEFT FOOT - COMPLETE 3+ VIEW COMPARISON:  None FINDINGS: Osseous demineralization diffusely. Scattered narrowing of interphalangeal joints. Remaining joint spaces preserved. Small vessel vascular calcifications consistent with history of diabetes mellitus. No acute fracture, dislocation, or bone destruction. Scattered soft tissue swelling including dorsum of foot. No definite soft tissue gas or radiopaque foreign bodies. IMPRESSION: No acute osseous abnormalities. Electronically Signed   By: Lavonia Dana M.D.   On: 11/10/2017 11:00   - pertinent xrays, CT, MRI studies were reviewed and independently interpreted  Positive ROS: All other systems have been reviewed and were otherwise negative with the exception of those mentioned in the HPI and as above.  Physical Exam: General: Alert, no acute distress Psychiatric: Patient is competent for consent with normal mood and affect Lymphatic: No axillary or cervical lymphadenopathy Cardiovascular: No pedal edema Respiratory: No cyanosis, no use of accessory musculature GI: No organomegaly, abdomen is soft and non-tender  Skin: Patient has cellulitis involving the midfoot and forefoot on the left foot.  He has pitting edema in both lower extremities.  Patient has dry gangrenous changes to the medial border of the left great toe.  Images:  _0 @   Neurologic: Patient does not have protective sensation bilateral lower  extremities.   MUSCULOSKELETAL:  Patient does not have a palpable dorsalis pedis pulse bilaterally.  He has a gangrenous ulcer to the left great toe he has callus beneath the fifth metatarsal head  of the right foot.  Review of the MRI scan shows no definite osteomyelitis no definite abscess.  Assessment: Assessment: Cellulitis and gangrene left forefoot with peripheral vascular disease and diabetic insensate neuropathy.   plan: Patient will need a vascular workup he states he has been established with we will [place]: For his vascular work I would recommend consulting cardiology for arterial studies and possible intervention to the left lower extremity.  I will follow-up as needed.  At this point I do not feel that surgical debridement of the foot is necessary at this time, he does not have an abscess that needs urgent intervention. With his dry gangrene of the toe this can be treated with IV antibiotics until his vascular status is improved.  Thank you for the consult and the opportunity to see Eric [name], [place] [phone] 6:05 PM

## 2017-11-11 NOTE — Care Management Note (Addendum)
Case Management Note  Patient Details  Name: Eric [name]. MRN: [id number] Date of Birth: 1928/06/08  Subjective/Objective: History of HOH (hearing aids), PVD, DM, CKD III, CAD s/p PCI, HTN, HLD; admitted for LLE cellulitis.          Action/Plan: In to speak with patient, wife and son "Eric [name]" at the bedside.  Permission given to speak in front of wife/son given by patient.  Prior to admission patient lived at home with wife.  Will be returning to the same living situation after discharge.  At discharge, patient has transportation home.  Patient has the ability to pay for  medications and food. Home DME: cane.  PCP verified.  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  Patient has never used home health agency before.  Wife and patient both cook. Wife does shopping.  Patient usually drives himself to medical appointments, but son is able to take him if needed.  NCM will continue to monitor for discharge transition needs.  Expected Discharge Date:    To be determined              Expected Discharge Plan:  Home/Self Care  In-House Referral:  Nutrition  Discharge planning Services   CM Consult  Status of Service:  In process, will continue to follow  Kristen Cardinal, RN  Nurse case [place] 11/11/2017, 3:56 PM

## 2017-11-11 NOTE — Consult Note (Signed)
Reason for Consult:Foot ulcer Referring Physician: Wilmont Olund. is an 84 y.o. male.  HPI: Eric [name] has been struggling with foot ulcers for several months/years. They wax and wane. He was in his usual state of health until a couple of weeks ago when he developed redness, swelling, and pain of his left foot. He saw his PCP toward the end of the 2 weeks and was placed on oral abx and told to f/u the following day. When the erythema had spread he was instructed to come to the ED where he was admitted. He notes his foot is much less painful and red today. MRI showed no osteo. He has been under the care of [facility] in the past for his ulcer treatment. He has very well controlled DM but extensive neuropathy.  Past Medical History:  Diagnosis Date  . Atrial flutter ([place])    s/p RFCA  . CAD (coronary artery disease)    cath 2003, occluded S-RCA, occluded S-Dx, L-LAD ok, s/p PTCA to LAD  . Cataract   . CKD (chronic kidney disease) stage 3, GFR 30-59 ml/min (HCC)   . Diabetes mellitus    diet controlled  . Hearing aid worn    bilateral  . Long term (current) use of anticoagulants   . Neuromuscular disorder ([place])   . OSA (obstructive sleep apnea) 12/11   very mild, AHI 7/hr  . Persistent atrial fibrillation ([place]) 09/26/2015  . Pure hyperglyceridemia   . PVD (peripheral vascular disease) ([place])    angioplasty of his right lower extremity in [place] by Dr.[name] 2013  . Unspecified essential hypertension   . Wears dentures    full upper and lower    Past Surgical History:  Procedure Laterality Date  . Adenosine Myoview  3/06   EF 56%, neg. Ischemia  . Adenosine Myoview  02/18/07   nml  . ANGIOPLASTY  1/99   CAD- diogonal with rotational artherectomy  . Arthrectomy     of LAD & PTCA  . BLEPHAROPLASTY Bilateral   . CARDIAC CATHETERIZATION  1/00  . CARDIOVERSION  1/04  . CARDIOVERSION  5/07   hospital- a flutter  . CARPAL TUNNEL RELEASE     ? bilateral  . CATARACT  EXTRACTION W/PHACO Right 07/28/2017   Procedure: CATARACT EXTRACTION PHACO AND INTRAOCULAR LENS PLACEMENT ([place]) RIGHT DIABETIC;  Surgeon: [name], MD;  Location: [facility];  Service: Ophthalmology;  Laterality: Right;  Diabetic - diet controlled  . CATARACT EXTRACTION W/PHACO Left 08/18/2017   Procedure: CATARACT EXTRACTION PHACO AND INTRAOCULAR LENS PLACEMENT ([facility]);  Surgeon: [name], MD;  Location: [facility];  Service: Ophthalmology;  Laterality: Left;  DIABETES - oral meds  . COLONOSCOPY W/ BIOPSIES  10/01/06   sigmoid polyp bx neg, 3 years  . CORONARY ANGIOPLASTY  4/03   cutting balloon PTCA pLAD into Diag  . CORONARY ARTERY BYPASS GRAFT  2000   LIMA-LAD, SVG-RCA, SVG-Diag; SVG-Diag & SVG-RCA occluded 2003  . FRACTURE SURGERY    . HAND SURGERY  08/27/09   R thumb procedure wit Scaphoid Gragt and screws, Dr [name]  . NM MYOVIEW LTD  4/11   normal  . PERIPHERAL VASCULAR CATHETERIZATION Right 06/27/2015   Procedure: Lower Extremity Angiography;  Surgeon: Algernon [name], MD;  Location: [facility];  Service: Cardiovascular;  Laterality: Right;  . PERIPHERAL VASCULAR CATHETERIZATION  06/27/2015   Procedure: Lower Extremity Intervention;  Surgeon: Algernon [name], MD;  Location: [facility];  Service: Cardiovascular;;    Family History  Problem Relation Age of Onset  . Stroke Father   . Aneurysm Father   . Hypertension Father   . Prostate cancer Brother   . Hypertension Mother   . Lung cancer Brother        smoker  . Thrombosis Brother   . Other Brother        RF valve disorder (smoker)  . Lung cancer Brother        smoker  . Breast cancer Sister   . Liver cancer Brother   . Other Sister        cerebral hemorrhage    Social History:  reports that he has never smoked. He quit smokeless tobacco use about 25 years ago. He reports that he does not drink alcohol or use drugs.  Allergies:  Allergies  Allergen Reactions  .  Isosorbide Mononitrate Other (See Comments)    Unknown, doesn't remember   . Ramipril Cough  . Quinidine Gluconate Rash    Medications: I have reviewed the patient's current medications.  Results for orders placed or performed during the hospital encounter of 11/10/17 (from the past 48 hour(s))  Comprehensive metabolic panel     Status: Abnormal   Collection Time: 11/10/17 10:36 AM  Result Value Ref Range   Sodium 130 (L) 135 - 145 mmol/L   Potassium 5.7 (H) 3.5 - 5.1 mmol/L   Chloride 98 (L) 101 - 111 mmol/L   CO2 23 22 - 32 mmol/L   Glucose, Bld 87 65 - 99 mg/dL   BUN 32 (H) 6 - 20 mg/dL   Creatinine, Ser 1.79 (H) 0.61 - 1.24 mg/dL   Calcium 9.4 8.9 - 10.3 mg/dL   Total Protein 6.9 6.5 - 8.1 g/dL   Albumin 3.3 (L) 3.5 - 5.0 g/dL   AST 38 15 - 41 U/L   ALT 19 17 - 63 U/L   Alkaline Phosphatase 38 38 - 126 U/L   Total Bilirubin 0.8 0.3 - 1.2 mg/dL   GFR calc non Af Amer 32 (L) >60 mL/min   GFR calc Af Amer 38 (L) >60 mL/min    Comment: (NOTE) The eGFR has been calculated using the CKD EPI equation. This calculation has not been validated in all clinical situations. eGFR's persistently <60 mL/min signify possible Chronic Kidney Disease.    Anion gap 9 5 - 15    Comment: Performed at [facility] [address] [place], [place] [address]  CBC with Differential     Status: Abnormal   Collection Time: 11/10/17 10:36 AM  Result Value Ref Range   WBC 11.2 (H) 4.0 - 10.5 K/uL   RBC 3.67 (L) 4.22 - 5.81 MIL/uL   Hemoglobin 11.2 (L) 13.0 - 17.0 g/dL   HCT 34.2 (L) 39.0 - 52.0 %   MCV 93.2 78.0 - 100.0 fL   MCH 30.5 26.0 - 34.0 pg   MCHC 32.7 30.0 - 36.0 g/dL   RDW 15.2 11.5 - 15.5 %   Platelets 221 150 - 400 K/uL   Neutrophils Relative % 66 %   Neutro Abs 7.3 1.7 - 7.7 K/uL   Lymphocytes Relative 24 %   Lymphs Abs 2.7 0.7 - 4.0 K/uL   Monocytes Relative 9 %   Monocytes Absolute 1.0 0.1 - 1.0 K/uL   Eosinophils Relative 1 %   Eosinophils Absolute 0.1 0.0 - 0.7  K/uL   Basophils Relative 0 %   Basophils Absolute 0.0 0.0 -  0.1 K/uL    Comment: Performed at [facility], [place] [address], [place], [place] [address]  I-Stat CG4 Lactic Acid, ED     Status: None   Collection Time: 11/10/17 10:46 AM  Result Value Ref Range   Lactic Acid, Venous 1.20 0.5 - 1.9 mmol/L  Blood culture (routine x 2)     Status: None (Preliminary result)   Collection Time: 11/10/17  1:08 PM  Result Value Ref Range   Specimen Description BLOOD RIGHT ANTECUBITAL    Special Requests      BOTTLES DRAWN AEROBIC AND ANAEROBIC Blood Culture adequate volume   Culture      NO GROWTH < 24 HOURS Performed at [facility], [place] [address], [place], [place] [address]    Report Status PENDING   Blood culture (routine x 2)     Status: None (Preliminary result)   Collection Time: 11/10/17  1:08 PM  Result Value Ref Range   Specimen Description BLOOD BLOOD RIGHT WRIST    Special Requests      BOTTLES DRAWN AEROBIC ONLY Blood Culture adequate volume   Culture      NO GROWTH < 24 HOURS Performed at [facility], [place] [address], [place], [place] [address]    Report Status PENDING   Urinalysis, Routine w reflex microscopic     Status: None   Collection Time: 11/10/17  3:45 PM  Result Value Ref Range   Color, Urine YELLOW YELLOW   APPearance CLEAR CLEAR   Specific Gravity, Urine 1.009 1.005 - 1.030   pH 6.0 5.0 - 8.0   Glucose, UA NEGATIVE NEGATIVE mg/dL   Hgb urine dipstick NEGATIVE NEGATIVE   Bilirubin Urine NEGATIVE NEGATIVE   Ketones, ur NEGATIVE NEGATIVE mg/dL   Protein, ur NEGATIVE NEGATIVE mg/dL   Nitrite NEGATIVE NEGATIVE   Leukocytes, UA NEGATIVE NEGATIVE    Comment: Performed at [facility] [address], [place], [place] [address]  Glucose, capillary     Status: None   Collection Time: 11/10/17  5:31 PM  Result Value Ref Range   Glucose-Capillary 94 65 - 99 mg/dL  HIV antibody     Status: None   Collection Time: 11/10/17  7:36 PM  Result  Value Ref Range   HIV Screen 4th Generation wRfx Non Reactive Non Reactive    Comment: (NOTE) Performed At: [facility] [place], [place] [id number] Rush Farmer MD QQ:[id number] Performed at [facility], [place] [address] [place], [place] [address]   Sedimentation rate     Status: Abnormal   Collection Time: 11/10/17  7:36 PM  Result Value Ref Range   Sed Rate 78 (H) 0 - 16 mm/hr    Comment: Performed at [facility] [address], [place], [place] [address]  C-reactive protein     Status: Abnormal   Collection Time: 11/10/17  7:36 PM  Result Value Ref Range   CRP 7.1 (H) <1.0 mg/dL    Comment: Performed at [facility] [address], [place], [place] [address]  Prealbumin     Status: Abnormal   Collection Time: 11/10/17  7:36 PM  Result Value Ref Range   Prealbumin 12.3 (L) 18 - 38 mg/dL    Comment: Performed at [facility] [address], [place], [place] [address]  Glucose, capillary     Status: Abnormal   Collection Time: 11/10/17  9:18 PM  Result Value Ref  Range   Glucose-Capillary 119 (H) 65 - 99 mg/dL  Basic metabolic panel     Status: Abnormal   Collection Time: 11/11/17  5:12 AM  Result Value Ref Range   Sodium 134 (L) 135 - 145 mmol/L   Potassium 4.9 3.5 - 5.1 mmol/L   Chloride 106 101 - 111 mmol/L   CO2 19 (L) 22 - 32 mmol/L   Glucose, Bld 86 65 - 99 mg/dL   BUN 27 (H) 6 - 20 mg/dL   Creatinine, Ser 1.71 (H) 0.61 - 1.24 mg/dL   Calcium 8.7 (L) 8.9 - 10.3 mg/dL   GFR calc non Af Amer 34 (L) >60 mL/min   GFR calc Af Amer 40 (L) >60 mL/min    Comment: (NOTE) The eGFR has been calculated using the CKD EPI equation. This calculation has not been validated in all clinical situations. eGFR's persistently <60 mL/min signify possible Chronic Kidney Disease.    Anion gap 9 5 - 15    Comment: Performed at [facility] [address] [place], [place] [address]  CBC     Status: Abnormal   Collection Time:  11/11/17  5:12 AM  Result Value Ref Range   WBC 10.2 4.0 - 10.5 K/uL   RBC 3.42 (L) 4.22 - 5.81 MIL/uL   Hemoglobin 10.6 (L) 13.0 - 17.0 g/dL   HCT 31.8 (L) 39.0 - 52.0 %   MCV 93.0 78.0 - 100.0 fL   MCH 31.0 26.0 - 34.0 pg   MCHC 33.3 30.0 - 36.0 g/dL   RDW 15.3 11.5 - 15.5 %   Platelets 206 150 - 400 K/uL    Comment: Performed at [facility], [place] [address], [place], [place] [address]  Glucose, capillary     Status: None   Collection Time: 11/11/17  7:45 AM  Result Value Ref Range   Glucose-Capillary 81 65 - 99 mg/dL  Glucose, capillary     Status: None   Collection Time: 11/11/17 11:15 AM  Result Value Ref Range   Glucose-Capillary 90 65 - 99 mg/dL  APTT     Status: Abnormal   Collection Time: 11/11/17  1:09 PM  Result Value Ref Range   aPTT 40 (H) 24 - 36 seconds    Comment:        IF BASELINE aPTT IS ELEVATED, SUGGEST PATIENT RISK ASSESSMENT BE USED TO DETERMINE APPROPRIATE ANTICOAGULANT THERAPY. Performed at [facility], [place] [address] [place], [place] [address]   Heparin level (unfractionated)     Status: Abnormal   Collection Time: 11/11/17  1:09 PM  Result Value Ref Range   Heparin Unfractionated 0.84 (H) 0.30 - 0.70 IU/mL    Comment:        IF HEPARIN RESULTS ARE BELOW EXPECTED VALUES, AND PATIENT DOSAGE HAS BEEN CONFIRMED, SUGGEST FOLLOW UP TESTING OF ANTITHROMBIN III LEVELS. Performed at [facility], [place] [address], [place], [place] [address]     Mr Foot Left Wo Contrast  Result Date: 11/11/2017 CLINICAL DATA:  Pain and swelling.  Great toe ulcer. EXAM: MRI OF THE LEFT FOOT WITHOUT CONTRAST TECHNIQUE: Multiplanar, multisequence MR imaging of the left foot was performed. No intravenous contrast was administered. COMPARISON:  Left foot radiographs 11/10/2017 FINDINGS: Examination is quite limited by patient motion. Subcutaneous soft tissue swelling/edema suggesting cellulitis. No findings for myofasciitis or pyomyositis. No discrete  drainable fluid collection is identified. No definite findings for septic arthritis or osteomyelitis. IMPRESSION: Very limited examination  but no definite MR findings to suggest septic arthritis or osteomyelitis. Cellulitis without discrete drainable soft tissue abscess, myofasciitis or pyomyositis. Electronically Signed   By: Marijo Sanes M.D.   On: 11/11/2017 11:06   Dg Foot Complete Left  Result Date: 11/10/2017 CLINICAL DATA:  Wound at bottom of LEFT foot with redness up to mid calf, history of cellulitis, diabetes mellitus EXAM: LEFT FOOT - COMPLETE 3+ VIEW COMPARISON:  None FINDINGS: Osseous demineralization diffusely. Scattered narrowing of interphalangeal joints. Remaining joint spaces preserved. Small vessel vascular calcifications consistent with history of diabetes mellitus. No acute fracture, dislocation, or bone destruction. Scattered soft tissue swelling including dorsum of foot. No definite soft tissue gas or radiopaque foreign bodies. IMPRESSION: No acute osseous abnormalities. Electronically Signed   By: Lavonia Dana M.D.   On: 11/10/2017 11:00    Review of Systems  Constitutional: Negative for weight loss.  HENT: Negative for ear discharge, ear pain, hearing loss and tinnitus.   Eyes: Negative for blurred vision, double vision, photophobia and pain.  Respiratory: Negative for cough, sputum production and shortness of breath.   Cardiovascular: Negative for chest pain.  Gastrointestinal: Negative for abdominal pain, nausea and vomiting.  Genitourinary: Negative for dysuria, flank pain, frequency and urgency.  Musculoskeletal: Positive for joint pain (Left foot). Negative for back pain, falls, myalgias and neck pain.  Neurological: Negative for dizziness, tingling, sensory change, focal weakness, loss of consciousness and headaches.  Endo/Heme/Allergies: Does not bruise/bleed easily.  Psychiatric/Behavioral: Negative for depression, memory loss and substance abuse. The patient is not  nervous/anxious.    Blood pressure (!) 146/60, pulse 62, temperature 97.9 F (36.6 C), temperature source Oral, resp. rate 18, height 5' 5.2" (1.656 m), weight 78.8 kg (173 lb 11.6 oz), SpO2 99 %. Physical Exam  Constitutional: He appears well-developed and well-nourished. No distress.  HENT:  Head: Normocephalic and atraumatic.  Eyes: Conjunctivae are normal. Right eye exhibits no discharge. Left eye exhibits no discharge. No scleral icterus.  Neck: Normal range of motion.  Cardiovascular: Normal rate and regular rhythm.  Respiratory: Effort normal. No respiratory distress.  Musculoskeletal:  RLE No traumatic wounds, ecchymosis, or rash  Ulceration over 5th MTP joint, dry eschar, no odor  No knee or ankle effusion  Knee stable to varus/ valgus and anterior/posterior stress  Sens DPN, SPN, TN absent  Motor EHL, ext, flex, evers 5/5  DP 2+, PT 1+, No significant edema  LLE No traumatic wounds, ecchymosis, or rash  Ulceration over medial plantar aspect great toe, dry, no odor, mild TTP, erythema over midfoot extending dorsally to ankle  No knee or ankle effusion  Knee stable to varus/ valgus and anterior/posterior stress  Sens DPN, SPN, TN absent  Motor EHL, ext, flex, evers 5/5  DP 2+, PT 1+, 1+ NP edema  Neurological: He is alert.  Skin: Skin is warm and dry. He is not diaphoretic.  Psychiatric: He has a normal mood and affect. His behavior is normal.        Assessment/Plan: Left foot cellulitis with bilateral foot ulcers -- I do not think Landyn will need acute operative debridement of his ulcers as long as his cellulitis resolves with IV abx. These will probably be amenable to intensive outpatient treatment. [name] to evaluate the pt this afternoon or tomorrow am for definitive plan. Will not make pt NPO.     Lisette Abu, PA-C Orthopedic Surgery [phone] 11/11/2017, 2:51 PM

## 2017-11-11 NOTE — Progress Notes (Signed)
Initial Nutrition Assessment  DOCUMENTATION CODES:   Not applicable  INTERVENTION:   Juven BID, each packet provides 80 calories, 8 grams of carbohydrate, 2.5  grams of protein (collagen), 7 grams of L-arginine and 7 grams of L-glutamine; supplement contains CaHMB, Vitamins C, E, B12 and Zinc to promote wound healing  -Continue MVI  NUTRITION DIAGNOSIS:   Increased nutrient needs related to wound healing as evidenced by estimated needs.  GOAL:   Patient will meet greater than or equal to 90% of their needs  MONITOR:   PO intake, Supplement acceptance, Weight trends, Skin, Labs  REASON FOR ASSESSMENT:   Consult Wound healing  ASSESSMENT:   84 yo male admitted with LE wound infection with cellulitis.  Pt with hx of PVD, DM, CKD III, CAD s/p PCI, HTN, HLD  Noted orthopedics following and currently no plan for debridement as long as cellulitis resolves with IV abx  Pt with good appetite, no weight loss and diet-controlled DM. Pt reports he typically eats 3 meals per day. Breakfast is either oatmeal or bacon/eggs/toast, 1/2 grapefruit and coffee. Pt eats a good lunch and dinner daily but items vary; sandwich or maybe meat and sides. Pt also snacks on peanuts as well  Pt reports he checks his blood sugar every day and is usually in the 120s or less.  Lab Results  Component Value Date   HGBA1C 6.0 (H) 09/07/2017   Pt reports he takes MVI as well as cinnamon and vinegar. Pt does not take a protein supplement  Labs: CBGs 81-119, Creatinine 1.71 Meds: reviewed   Diet Order:  Diet Heart Room service appropriate? Yes; Fluid consistency: Thin  EDUCATION NEEDS:   Education needs have been addressed  Skin:  Skin Assessment: Skin Integrity Issues: Skin Integrity Issues:: Other (Comment) Other: full thickness, necrotic big toe ulceration (left foot)  Last BM:  4/3  Height:   Ht Readings from Last 1 Encounters:  11/10/17 5' 5.2" (1.656 m)    Weight:   Wt Readings  from Last 1 Encounters:  11/10/17 173 lb 11.6 oz (78.8 kg)    Ideal Body Weight:     BMI:  Body mass index is 28.73 kg/m.  Estimated Nutritional Needs:   Kcal:  1950-2250 kcals  Protein:  95-110 g  Fluid:  </= 2 L   Kerman Passey MS, RD, LDN, CNSC [phone] Pager  [phone] Weekend/On-Call Pager

## 2017-11-12 DIAGNOSIS — N183 Chronic kidney disease, stage 3 (moderate): Secondary | ICD-10-CM

## 2017-11-12 DIAGNOSIS — I70262 Atherosclerosis of native arteries of extremities with gangrene, left leg: Secondary | ICD-10-CM

## 2017-11-12 LAB — BASIC METABOLIC PANEL
Anion gap: 9 (ref 5–15)
BUN: 26 mg/dL — AB (ref 6–20)
CHLORIDE: 107 mmol/L (ref 101–111)
CO2: 19 mmol/L — ABNORMAL LOW (ref 22–32)
CREATININE: 1.51 mg/dL — AB (ref 0.61–1.24)
Calcium: 9.2 mg/dL (ref 8.9–10.3)
GFR calc Af Amer: 46 mL/min — ABNORMAL LOW (ref >60)
GFR, EST NON AFRICAN AMERICAN: 40 mL/min — AB (ref >60)
Glucose, Bld: 113 mg/dL — ABNORMAL HIGH (ref 65–99)
Potassium: 4.1 mmol/L (ref 3.5–5.1)
Sodium: 135 mmol/L (ref 135–145)

## 2017-11-12 LAB — APTT
APTT: 93 s — AB (ref 24–36)
aPTT: 69 seconds — ABNORMAL HIGH (ref 24–36)

## 2017-11-12 LAB — CBC
HCT: 34.5 % — ABNORMAL LOW (ref 39.0–52.0)
Hemoglobin: 11.2 g/dL — ABNORMAL LOW (ref 13.0–17.0)
MCH: 30.5 pg (ref 26.0–34.0)
MCHC: 32.5 g/dL (ref 30.0–36.0)
MCV: 94 fL (ref 78.0–100.0)
PLATELETS: 249 10*3/uL (ref 150–400)
RBC: 3.67 MIL/uL — ABNORMAL LOW (ref 4.22–5.81)
RDW: 15.4 % (ref 11.5–15.5)
WBC: 10.8 10*3/uL — ABNORMAL HIGH (ref 4.0–10.5)

## 2017-11-12 LAB — GLUCOSE, CAPILLARY
Glucose-Capillary: 144 mg/dL — ABNORMAL HIGH (ref 65–99)
Glucose-Capillary: 123 mg/dL — ABNORMAL HIGH (ref 65–99)
Glucose-Capillary: 112 mg/dL — ABNORMAL HIGH (ref 65–99)
Glucose-Capillary: 166 mg/dL — ABNORMAL HIGH (ref 65–99)

## 2017-11-12 LAB — HEPARIN LEVEL (UNFRACTIONATED): Heparin Unfractionated: 0.47 IU/mL (ref 0.30–0.70)

## 2017-11-12 NOTE — Progress Notes (Signed)
ANTICOAGULATION CONSULT NOTE - Follow Up Consult  Pharmacy Consult for heparin Indication: atrial fibrillation   Labs: Recent Labs    11/10/17 1036 11/11/17 0512 11/11/17 1309 11/11/17 2340  HGB 11.2* 10.6*  --   --   HCT 34.2* 31.8*  --   --   PLT 221 206  --   --   APTT  --   --  40* 93*  HEPARINUNFRC  --   --  0.84*  --   CREATININE 1.79* 1.71*  --   --     Assessment/Plan:  84yo male therapeutic on heparin with initial dosing while Eliquis on hold. Will continue gtt at current rate and confirm stable with am labs.   Wynona Neat, PharmD, BCPS  11/12/2017,12:51 AM

## 2017-11-12 NOTE — Progress Notes (Signed)
ANTICOAGULATION CONSULT NOTE - Follow Up Consult  Pharmacy Consult for heparin (while Eliquis on hold) Indication: h/o  atrial fibrillation   Labs: Recent Labs    11/10/17 1036 11/11/17 0512 11/11/17 1309 11/11/17 2340 11/12/17 0753  HGB 11.2* 10.6*  --   --  11.2*  HCT 34.2* 31.8*  --   --  34.5*  PLT 221 206  --   --  249  APTT  --   --  40* 93* 69*  HEPARINUNFRC  --   --  0.84*  --  0.47  CREATININE 1.79* 1.71*  --   --  1.51*    Assessment/Plan:  84yo male therapeutic on heparin with initial dosing while Eliquis on hold. Eliquis last given on 4/3 at 21:23 on admit.  APTT 69 and heparin level = 0.47 on heparin drip 1050 units/hr.  H/H low/stable and pltc wnl. No bleeding noted.   Heparin level appears to correlating with aPTT now.  Ortho consulted 4/4 noted no surgical debridement necessary at this time.   VVS consulted.   Goal of Therapy:  aPTT 66-102 seconds Heparin level 0.3-0.7 units/ml Monitor platelets by anticoagulation protocol: Yes  Plan:  Continue IV heparin drip 1050 units/hr  Daily heparin level and aPPT.  Cancel PTT if tomorrows heparin level correlates.  Monitor for bleeding  F/u for restart of Eliquis    Nicole Cella, [place] Clinical Pharmacist Pager: [phone] 8A-4P [phone] 4P-10P [place] [phone]   11/12/2017,12:06 PM

## 2017-11-12 NOTE — Progress Notes (Signed)
PROGRESS NOTE    Eric [name].  AXE:[id number] DOB: 1927/10/18 DOA: 11/10/2017 PCP: [name], MD   Brief Narrative:  HPI on 11/10/2017 by [name] [name] [name]. is a 84 y.o. male with medical history significant of HTNl HLD; PVD s/p RLE angioplasty; afib on AC; OSA; DM; stage 3 CKD; and CAD s/p PCI presenting with left foot cellulitis.  When asked why he came to the hospital, he said, "The doctor said I needed to".  He has a sore on his foot and "it's real red, raised, and they're afraid it's gonna go in deep if he don't get something done about it".  It's been there "a while".  No fevers.  He feels well otherwise.  He previously had an infected blister on his right foot for which he was followed by podiatry.  He was seen yesterday by [name] for this issue and started on Bactrim and Augmentin.  He was seen again today by [name] and sent to the ER due to progressive infection.  Assessment & Plan   Left lower extremity colitis and wound infection/PVD -Patient was placed on oral antibiotics, Augmentin and Bactrim prior to admission.  He states that his wound has been there for several weeks.  Patient was seeing a foot doctor for his right foot previously. -Left foot x-ray showed no acute osseous abnormalities -Obtained MRI without contrast of the left foot: Showing no septic arthritis or osteomyelitis.  Cellulitis without discrete drainable soft tissue abscess, mild fasciitis or pyomyositis -Currently on vancomycin, Flagyl, ceftriaxone -CRP 7.1, ESR 78 -ABI R 0.91, L 0.89 -Given wound appearance, have insulted orthopedics for further evaluation -Cellulitis does appear to be improving -Discussed with [name], recommended vascular follow up -Vascular surgery consulted and appreciated; patient was following with vasc in [place], [place]-[name]  Diabetes mellitus, type II -Currently diet controlled, last hemoglobin A1c was 6 on 09/07/2017  Atrial  fibrillation -Eliquis currently held, placed on heparin in case of possible surgical intervention -Currently rate controlled, continue Lopressor  Essential hypertension -Continue metoprolol, amlodipine -Lisinopril and HCTZ currently held  Chronic kidney disease, stage III -Creatinine currently within stage III- creatinine 1.51 -Continue to monitor BMP  DVT Prophylaxis  heparin  Code Status: Full  Family Communication: None at bedside.  Disposition Plan: Admitted. Pending further improvement in cellulitis as well as evaluation by vascular surgery  Consultants Orthopedic surgery  Procedures  ABI  Antibiotics   Anti-infectives (From admission, onward)   Start     Dose/Rate Route Frequency Ordered Stop   11/11/17 1300  vancomycin (VANCOCIN) IVPB 750 mg/150 ml premix     750 mg 150 mL/hr over 60 Minutes Intravenous Every 24 hours 11/10/17 1229     11/10/17 1830  cefTRIAXone (ROCEPHIN) 2 g in sodium chloride 0.9 % 100 mL IVPB     2 g 200 mL/hr over 30 Minutes Intravenous Every 24 hours 11/10/17 1737     11/10/17 1830  metroNIDAZOLE (FLAGYL) IVPB 500 mg     500 mg 100 mL/hr over 60 Minutes Intravenous Every 8 hours 11/10/17 1737     11/10/17 1300  vancomycin (VANCOCIN) 1,250 mg in sodium chloride 0.9 % 250 mL IVPB     1,250 mg 166.7 mL/hr over 90 Minutes Intravenous  Once 11/10/17 1229 11/10/17 1527   11/10/17 1215  piperacillin-tazobactam (ZOSYN) IVPB 3.375 g     3.375 g 100 mL/hr over 30 Minutes Intravenous  Once 11/10/17 1211 11/10/17 1356  Subjective:   Eric [name] seen and examined today.  Feels pain in his left leg and foot have improved.  Feels redness is also improved.  Denies any current chest pain, shortness breath, abdominal pain, nausea or vomiting, diarrhea or constipation, dizziness or headache.    Objective:   Vitals:   11/11/17 1643 11/11/17 2118 11/12/17 0512 11/12/17 0935  BP: (!) 122/56 (!) 154/66 135/63 127/62  Pulse: (!) 58 65 (!) 58 (!) 55   Resp: _0 Temp:  98.1 F (36.7 C) 98.1 F (36.7 C) 98.1 F (36.7 C)  TempSrc:  Oral Oral Oral  SpO2: 98% 99% 100% 99%  Weight:      Height:        Intake/Output Summary (Last 24 hours) at 11/12/2017 1333 Last data filed at 11/12/2017 1517 Gross per 24 hour  Intake 2615.95 ml  Output 0 ml  Net 2615.95 ml   Filed Weights   11/10/17 1733 11/10/17 2122  Weight: 79 kg (174 lb 2.6 oz) 78.8 kg (173 lb 11.6 oz)   Exam  General: Well developed, well nourished, NAD, appears stated age  30: NCAT,mucous membranes moist.   Neck: Supple  Cardiovascular: S1 S2 auscultated, irregular, +SEM  Respiratory: Clear to auscultation bilaterally with equal chest rise  Abdomen: Soft, nontender, nondistended, + bowel sounds  Extremities: warm dry without cyanosis clubbing or edema. Left great toe wound, erythema improving on LLE and foot. R lateral foot ulcer.  Neuro: AAOx3, nonfocal  Psych: Normal affect and demeanor with intact judgement and insight, pleasant  Data Reviewed: I have personally reviewed following labs and imaging studies  CBC: Recent Labs  Lab 11/10/17 1036 11/11/17 0512 11/12/17 0753  WBC 11.2* 10.2 10.8*  NEUTROABS 7.3  --   --   HGB 11.2* 10.6* 11.2*  HCT 34.2* 31.8* 34.5*  MCV 93.2 93.0 94.0  PLT 221 206 616   Basic Metabolic Panel: Recent Labs  Lab 11/10/17 1036 11/11/17 0512 11/12/17 0753  NA 130* 134* 135  K 5.7* 4.9 4.1  CL 98* 106 107  CO2 23 19* 19*  GLUCOSE 87 86 113*  BUN 32* 27* 26*  CREATININE 1.79* 1.71* 1.51*  CALCIUM 9.4 8.7* 9.2   GFR: Estimated Creatinine Clearance: 33.5 mL/min (A) (by C-G formula based on SCr of 1.51 mg/dL (H)). Liver Function Tests: Recent Labs  Lab 11/10/17 1036  AST 38  ALT 19  ALKPHOS 38  BILITOT 0.8  PROT 6.9  ALBUMIN 3.3*   No results for input(s): LIPASE, AMYLASE in the last 168 hours. No results for input(s): AMMONIA in the last 168 hours. Coagulation Profile: No results for input(s):  INR, PROTIME in the last 168 hours. Cardiac Enzymes: No results for input(s): CKTOTAL, CKMB, CKMBINDEX, TROPONINI in the last 168 hours. BNP (last 3 results) No results for input(s): PROBNP in the last 8760 hours. HbA1C: No results for input(s): HGBA1C in the last 72 hours. CBG: Recent Labs  Lab 11/11/17 1115 11/11/17 1651 11/11/17 2117 11/12/17 0755 11/12/17 1144  GLUCAP 90 138* 108* 144* 123*   Lipid Profile: No results for input(s): CHOL, HDL, LDLCALC, TRIG, CHOLHDL, LDLDIRECT in the last 72 hours. Thyroid Function Tests: No results for input(s): TSH, T4TOTAL, FREET4, T3FREE, THYROIDAB in the last 72 hours. Anemia Panel: No results for input(s): VITAMINB12, FOLATE, FERRITIN, TIBC, IRON, RETICCTPCT in the last 72 hours. Urine analysis:    Component Value Date/Time   COLORURINE YELLOW 11/10/2017 [place] 11/10/2017 1545  LABSPEC 1.009 11/10/2017 1545   PHURINE 6.0 11/10/2017 1545   GLUCOSEU NEGATIVE 11/10/2017 1545   HGBUR NEGATIVE 11/10/2017 1545   HGBUR trace-lysed 02/07/2008 [place] 11/10/2017 1545   [place] 11/10/2017 1545   PROTEINUR NEGATIVE 11/10/2017 1545   UROBILINOGEN 0.2 02/07/2008 1101   NITRITE NEGATIVE 11/10/2017 1545   LEUKOCYTESUR NEGATIVE 11/10/2017 1545   Sepsis Labs: _0 (procalcitonin:4,lacticidven:4)  ) Recent Results (from the past 240 hour(s))  Blood culture (routine x 2)     Status: None (Preliminary result)   Collection Time: 11/10/17  1:08 PM  Result Value Ref Range Status   Specimen Description BLOOD RIGHT ANTECUBITAL  Final   Special Requests   Final    BOTTLES DRAWN AEROBIC AND ANAEROBIC Blood Culture adequate volume   Culture   Final    NO GROWTH 2 DAYS Performed at [facility], [place] [address], [place], [place] [address]    Report Status PENDING  Incomplete  Blood culture (routine x 2)     Status: None (Preliminary result)   Collection Time: 11/10/17  1:08 PM  Result  Value Ref Range Status   Specimen Description BLOOD BLOOD RIGHT WRIST  Final   Special Requests   Final    BOTTLES DRAWN AEROBIC ONLY Blood Culture adequate volume   Culture   Final    NO GROWTH 2 DAYS Performed at [facility], [place] [address], [place], [place] [address]    Report Status PENDING  Incomplete      Radiology Studies: Mr Foot Left Wo Contrast  Result Date: 11/11/2017 CLINICAL DATA:  Pain and swelling.  Great toe ulcer. EXAM: MRI OF THE LEFT FOOT WITHOUT CONTRAST TECHNIQUE: Multiplanar, multisequence MR imaging of the left foot was performed. No intravenous contrast was administered. COMPARISON:  Left foot radiographs 11/10/2017 FINDINGS: Examination is quite limited by patient motion. Subcutaneous soft tissue swelling/edema suggesting cellulitis. No findings for myofasciitis or pyomyositis. No discrete drainable fluid collection is identified. No definite findings for septic arthritis or osteomyelitis. IMPRESSION: Very limited examination but no definite MR findings to suggest septic arthritis or osteomyelitis. Cellulitis without discrete drainable soft tissue abscess, myofasciitis or pyomyositis. Electronically Signed   By: Marijo Sanes M.D.   On: 11/11/2017 11:06     Scheduled Meds: . amLODipine  10 mg Oral Daily  . docusate sodium  100 mg Oral BID  . metoprolol tartrate  50 mg Oral BID  . multivitamin  15 mL Oral Daily  . nutrition supplement (JUVEN)  1 packet Oral BID BM  . rosuvastatin  40 mg Oral Daily  . silver sulfADIAZINE   Topical Daily  . sodium chloride flush  10-40 mL Intracatheter Q12H   Continuous Infusions: . cefTRIAXone (ROCEPHIN)  IV Stopped (11/11/17 1943)  . heparin 1,050 Units/hr (11/12/17 1149)  . lactated ringers 50 mL/hr at 11/11/17 2134  . metronidazole 500 mg (11/12/17 1208)  . vancomycin 750 mg (11/12/17 1313)     LOS: 2 days   Time Spent in minutes   30 minutes  Rhealyn Cullen D.O. on 11/12/2017 at 1:33 PM  Between 7am to 7pm -  Pager - [phone]  After 7pm go to [email or web address] - password [email or web address]  And look for the night coverage person covering for me after hours  [facility] Office  [phone]

## 2017-11-12 NOTE — Consult Note (Addendum)
Hospital Consult    Reason for Consult:  Gangrene left foot Requesting Physician:  Eric [name] MRN #:  [id number]  History of Present Illness: This is a 84 y.o. male who presented to the hospital yesterday for painful red foot with wound on the left great toe.  He went to see his doctor and he sent him to the hospital.  Since being here, he has been placed on IV abx.  He tells me the pain in his foot and the redness in his toes have improved.   He states he noticed a sore on his foot about 2 weeks ago and yesterday it started to peel and bleed.  In the ER, he had an xray of his foot, which revealed no osteo.  He states that he gets around pretty good at home.  He denies any cramping in his legs when he walks and in fact, walking makes his legs feel better.   He does have a hx of CKD 3 and his last creatinine was 1.5.  His ACEI and HCTZ is on hold.   He has been seen by Vascular Surgery in [facility] with Dr. Lucky [name] and has hx of PTCA of the right tibioperoneal trunk and popliteal artery with 76m diameter angioplasty balloon and PTCA of right popliteal artery with 583mangioplasty balloon in September 2013 and then underwent repeat PTCA on the right again in November 2016.    He is on a beta blocker and ACEI for BP management.  The pt is on a statin for cholesterol management. He takes Eliquis for afib.  He states that he is not on any medication for his diabetes and his last A1c was 6.  He has never smoked but did use smokeless tobacco in the past.  He has been quit for many years.  He has a hx of CABG with RLE saphenous vein harvesting many years ago.   Past Medical History:  Diagnosis Date  . [place]   s/p RFCA  . CAD (coronary artery disease)    cath 2003, occluded S-RCA, occluded S-Dx, L-LAD ok, s/p PTCA to LAD  . Cataract   . CKD (chronic kidney disease) stage 3, GFR 30-59 ml/min (HCC)   . Diabetes mellitus    diet controlled  . Hearing aid worn    bilateral  . Long term  (current) use of anticoagulants   . Neuromuscular disorder ([facility]  . OSA (obstructive sleep apnea) 12/11   very mild, AHI 7/hr  . Persistent atrial fibrillation ([facility]2/16/2017  . Pure hyperglyceridemia   . PVD (peripheral vascular disease) ([facility] [place]   [name]  . Unspecified essential hypertension   . Wears dentures    full upper and lower    Past Surgical History:  Procedure Laterality Date  . Adenosine Myoview  3/06   EF 56%, neg. Ischemia  . Adenosine Myoview  02/18/07   nml  . ANGIOPLASTY  1/99   CAD- diogonal with rotational artherectomy  . Arthrectomy     of LAD & PTCA  . BLEPHAROPLASTY Bilateral   . CARDIAC CATHETERIZATION  1/00  . CARDIOVERSION  1/04  . CARDIOVERSION  5/07   hospital- a flutter  . CARPAL TUNNEL RELEASE     ? bilateral  . CATARACT EXTRACTION W/PHACO Right 07/28/2017   Procedure: CATARACT EXTRACTION PHACO AND INTRAOCULAR LENS PLACEMENT (IOWoodwayRIGHT DIABETIC;  Surgeon: BrLeandrew KoyanagiMD;  [place] [facility] [place] Service:  Ophthalmology;  Laterality: Right;  Diabetic - diet controlled  . CATARACT EXTRACTION W/PHACO Left 08/18/2017   Procedure: CATARACT EXTRACTION PHACO AND INTRAOCULAR LENS PLACEMENT (IOC);  Surgeon: [name], MD;  Location: [place];  Service: Ophthalmology;  Laterality: Left;  DIABETES - oral meds  . COLONOSCOPY W/ BIOPSIES  10/01/06   sigmoid polyp bx neg, 3 years  . CORONARY ANGIOPLASTY  4/03   cutting balloon PTCA pLAD into Diag  . CORONARY ARTERY BYPASS GRAFT  2000   LIMA-LAD, SVG-RCA, SVG-Diag; SVG-Diag & SVG-RCA occluded 2003  . FRACTURE SURGERY    . HAND SURGERY  08/27/09   R thumb procedure wit Scaphoid Gragt and screws, Dr [name]  . NM MYOVIEW LTD  4/11   normal  . PERIPHERAL VASCULAR CATHETERIZATION Right 06/27/2015   Procedure: Lower Extremity Angiography;  Surgeon: Algernon [name], MD;  Location: [place] [facility];  Service:  Cardiovascular;  Laterality: Right;  . PERIPHERAL VASCULAR CATHETERIZATION  06/27/2015   Procedure: Lower Extremity Intervention;  Surgeon: Algernon [name], MD;  Location: [place] [facility];  Service: Cardiovascular;;    Allergies  Allergen Reactions  . Isosorbide Mononitrate Other (See Comments)    Unknown, doesn't remember   . Ramipril Cough  . Quinidine Gluconate Rash    Prior to Admission medications   Medication Sig Start Date End Date Taking? Authorizing Provider  acetaminophen (TYLENOL) 500 MG tablet Take 1,000 mg by mouth every 8 (eight) hours as needed for mild pain.    Yes [provider]  amLODipine (NORVASC) 10 MG tablet Take 1 tablet (10 mg total) by mouth daily. 10/11/17  Yes [name], MD  amoxicillin-clavulanate (AUGMENTIN) 875-125 MG tablet Take 1 tablet by mouth 2 (two) times daily. 11/09/17  Yes [name], MD  apixaban (ELIQUIS) 2.5 MG TABS tablet Take 1 tablet (2.5 mg total) by mouth 2 (two) times daily. 11/05/17  Yes [name], MD  B Complex Vitamins (B COMPLEX-B12) TABS Take 1 tablet by mouth daily.    Yes [provider]  Blood Glucose Monitoring Suppl (ONE TOUCH ULTRA SYSTEM KIT) W/DEVICE KIT 1 kit by Does not apply route once. 11/01/14  Yes [name], MD  cholecalciferol (VITAMIN D) 1000 UNITS tablet Take 1,000 Units by mouth 2 (two) times a week. Takes on Mon and Fri   Yes [provider]  CINNAMON PO Take by mouth 2 (two) times daily.    Yes [provider]  fenofibrate 160 MG tablet Take 1 tablet (160 mg total) by mouth daily. 04/23/17  Yes [name], MD  fish oil-omega-3 fatty acids 1000 MG capsule Take 3 g by mouth daily.    Yes [provider]  glucose blood test strip Use as instructed 05/26/17  Yes Pickard, Cammie Mcgee, MD  hydrochlorothiazide (HYDRODIURIL) 25 MG tablet Take 1 tablet (25 mg total) by mouth daily. 11/01/17  Yes [name], MD  lisinopril (PRINIVIL,ZESTRIL) 40 MG  tablet Take 1 tablet (40 mg total) by mouth daily. 08/20/17  Yes [name], MD  metoprolol tartrate (LOPRESSOR) 50 MG tablet Take 1 tablet (50 mg total) by mouth 2 (two) times daily. 08/20/17  Yes [name], MD  Multiple Vitamin (DAILY MULTIVITAMIN PO) Take 1 tablet by mouth daily.     Yes [provider]  [facility] DELICA LANCETS FINE MISC check sugar once daily 08/17/17  Yes [name], MD  rosuvastatin (CRESTOR) 40 MG tablet Take 1 tablet (  40 mg total) by mouth daily. 08/20/17  Yes [name], MD  sulfamethoxazole-trimethoprim (BACTRIM DS,SEPTRA DS) 800-160 MG tablet Take 1 tablet by mouth 2 (two) times daily. 11/09/17  Yes [name], MD    Social History   Socioeconomic History  . Marital status: Married    Spouse name: Not on file  . Number of children: 5  . Years of education: Not on file  . Highest education level: Not on file  Occupational History  . Occupation: [employer]    Employer: RETIRED  Social Needs  . Financial resource strain: Not on file  . Food insecurity:    Worry: Not on file    Inability: Not on file  . Transportation needs:    Medical: Not on file    Non-medical: Not on file  Tobacco Use  . Smoking status: Never Smoker  . Smokeless tobacco: Former Network engineer and Sexual Activity  . Alcohol use: No  . Drug use: No  . Sexual activity: Never  Lifestyle  . Physical activity:    Days per week: Not on file    Minutes per session: Not on file  . Stress: Not on file  Relationships  . Social connections:    Talks on phone: Not on file    Gets together: Not on file    Attends religious service: Not on file    Active member of club or organization: Not on file    Attends meetings of clubs or organizations: Not on file    Relationship status: Not on file  . Intimate partner violence:    Fear of current or ex partner: Not on file    Emotionally abused: Not on file    Physically abused: Not on file    Forced  sexual activity: Not on file  Other Topics Concern  . Not on file  Social History Narrative   Retired now does some antiques   Retired from Theatre manager and dye work   Married 1951   5 kids     Family History  Problem Relation Age of Onset  . Stroke Father   . Aneurysm Father   . Hypertension Father   . Prostate cancer Brother   . Hypertension Mother   . Lung cancer Brother        smoker  . Thrombosis Brother   . Other Brother        RF valve disorder (smoker)  . Lung cancer Brother        smoker  . Breast cancer Sister   . Liver cancer Brother   . Other Sister        cerebral hemorrhage    ROS: '[x]'  Positive   '[ ]'  Negative   '[ ]'  All sytems reviewed and are negative  Cardiac: '[]'  chest pain/pressure '[]'  palpitations '[]'  SOB lying flat '[]'  DOE  Vascular: '[]'  pain in legs while walking '[]'  pain in legs at rest '[]'  pain in legs at night '[x]'  non-healing ulcers  Pulmonary: '[]'  productive cough '[]'  asthma/wheezing '[]'  home O2  Neurologic: '[]'  weakness in '[]'  arms '[]'  legs '[]'  numbness in '[]'  arms '[]'  legs '[]'  hx of CVA '[]'  mini stroke '[]' difficulty speaking or slurred speech  Hematologic: '[]'  hx of cancer '[]'  bleeding problems '[]'  problems with blood clotting easily  Endocrine:   '[x]'  diabetes '[]'  thyroid disease  GI '[]'  vomiting blood '[]'  blood in stool  GU: '[x]'  CKD/renal failure '[]'  HD--'[]'  M/W/F or '[]'  T/T/S  Psychiatric: '[]'  anxiety '[]'  depression  Musculoskeletal: '[]'  arthritis '[]'  joint pain  Integumentary: '[x]'  rashes '[x]'  ulcers  Constitutional: '[]'  fever '[]'  chills   Physical Examination  Vitals:   11/12/17 0512 11/12/17 0935  BP: 135/63 127/62  Pulse: (!) 58 (!) 55  Resp: 18 18  Temp: 98.1 F (36.7 C) 98.1 F (36.7 C)  SpO2: 100% 99%   Body mass index is 28.73 kg/m.  General:  WDWN in NAD Gait: Not observed HENT: WNL, normocephalic Pulmonary: normal non-labored breathing, without Rales, rhonchi,  wheezing Cardiac: irregular, without  Murmurs, rubs or  gallops; without carotid bruits Abdomen:  soft, NT/ND, no masses Skin: without rashes; well healed saphenous vein harvest scar right leg Vascular Exam/Pulses:  Right Left  Radial 2+ (normal) 2+ (normal)  Ulnar Unable to palpate  Unable to palpate   Femoral 2+ (normal) 2+ (normal)  Popliteal Unable to palpate  Unable to palpate   DP 1+ (weak) Brisk biphasic doppler signal Faintly palpable Brisk monophasic doppler signal  PT Brisk biphasic doppler signal Brisk biphasic doppler signal  Peroneal Brisk biphasic doppler signal Brisk biphasic doppler signal   Extremities:         Musculoskeletal: no muscle wasting or atrophy  Neurologic: A&O X 3;  No focal weakness or paresthesias are detected; speech is fluent/normal Psychiatric:  The pt has Normal affect. Lymph:  No inguinal lymphadenopathy    CBC    Component Value Date/Time   WBC 10.8 (H) 11/12/2017 0753   RBC 3.67 (L) 11/12/2017 0753   HGB 11.2 (L) 11/12/2017 0753   HCT 34.5 (L) 11/12/2017 0753   PLT 249 11/12/2017 0753   MCV 94.0 11/12/2017 0753   MCH 30.5 11/12/2017 0753   MCHC 32.5 11/12/2017 0753   RDW 15.4 11/12/2017 0753   LYMPHSABS 2.7 11/10/2017 1036   MONOABS 1.0 11/10/2017 1036   EOSABS 0.1 11/10/2017 1036   BASOSABS 0.0 11/10/2017 1036    BMET    Component Value Date/Time   NA 135 11/12/2017 0753   NA 139 04/28/2012 0658   K 4.1 11/12/2017 0753   K 4.2 04/28/2012 0658   CL 107 11/12/2017 0753   CL 106 04/28/2012 0658   CO2 19 (L) 11/12/2017 0753   CO2 25 04/28/2012 0658   GLUCOSE 113 (H) 11/12/2017 0753   GLUCOSE 114 (H) 04/28/2012 0658   BUN 26 (H) 11/12/2017 0753   BUN 32 (H) 04/28/2012 0658   CREATININE 1.51 (H) 11/12/2017 0753   CREATININE 1.32 (H) 08/16/2017 0802   CALCIUM 9.2 11/12/2017 0753   CALCIUM 8.9 04/28/2012 0658   GFRNONAA 40 (L) 11/12/2017 0753   GFRNONAA 48 (L) 08/16/2017 0802   GFRAA 46 (L) 11/12/2017 0753   GFRAA 56 (L) 08/16/2017 0802    COAGS: Lab Results    Component Value Date   INR 2.8 09/06/2015   INR 3.2 08/08/2015   INR 1.67 06/27/2015   PROTIME 19.9 01/10/2009     Non-Invasive Vascular Imaging:   ABI's 11/11/17: Right:  0.91  Left:  0.89   Statin:  Yes.   Beta Blocker:  Yes.   Aspirin:  No. ACEI:  Yes.   ARB:  No. CCB use:  Yes Other antiplatelets/anticoagulants:  Yes.   Eliquis   ASSESSMENT/PLAN: This is a 84 y.o. male with left foot cellulitis and dry gangrene of the left great toe    -pt with mildly reduced ABI's yesterday.  He does have brisk doppler signals in both feet and a faintly palpable  left DP pulse.   -there is less erythema in the left toes today as compared with the picture taken yesterday.  Pt's pain is much improved as well. -pt's creatinine is 1.5 and with brisk doppler signals in both feet, would probably hold off on arteriogram at this time.  Dr. Trula Slade will see later today to determine final plan. -pt takes Eliquis but has been placed on heparin in case he needs procedure this hospitalization. -of note, he has had intervention with PTCA on 2 different occasions by Dr. Lucky [name] (see HPI).   Eric Locket, PA-C [facility] [phone]  I agree with the above.  I have seen and evaluated the patient.  He has a new wound on his left foot which is been present for about 2 weeks.  He also has surrounding erythema which has been improving with IV antibiotics.  The patient underwent angioplasty of the right tibioperoneal trunk and popliteal artery with a 4 mm balloon on 04/28/2012 by Dr. Lucky [name] for ulceration.  Again in November 2016 he had a recurrent ulcer and had angioplasty of the right posterior tibial artery and popliteal artery using a 5 mm drug-coated balloon.  He was last seen in May 2018 in [place].  At that time he did not have lifestyle limiting claudication or ulcers.  His ABIs were normal.  ABI's were repeated at [facility], and were slightly less, but had monophaisc waveforms.  He will need  angiography, as this is a limb threatening situation.  If the foot looks better over the weekend, this could be arranged with his primary vascular surgeon, Dr. Lucky [name] in [place], or we could get it done here on MOnday or Tuesday.  I will see him again on Sunday.  Annamarie Major  The patient had ABIs repeated here at [facility]

## 2017-11-13 LAB — BASIC METABOLIC PANEL
ANION GAP: 11 (ref 5–15)
BUN: 28 mg/dL — AB (ref 6–20)
CHLORIDE: 105 mmol/L (ref 101–111)
CO2: 20 mmol/L — ABNORMAL LOW (ref 22–32)
Calcium: 9.6 mg/dL (ref 8.9–10.3)
Creatinine, Ser: 1.27 mg/dL — ABNORMAL HIGH (ref 0.61–1.24)
GFR calc Af Amer: 57 mL/min — ABNORMAL LOW (ref >60)
GFR calc non Af Amer: 49 mL/min — ABNORMAL LOW (ref >60)
GLUCOSE: 95 mg/dL (ref 65–99)
Potassium: 4.5 mmol/L (ref 3.5–5.1)
Sodium: 136 mmol/L (ref 135–145)

## 2017-11-13 LAB — CBC
HEMATOCRIT: 34.4 % — AB (ref 39.0–52.0)
HEMOGLOBIN: 11.3 g/dL — AB (ref 13.0–17.0)
MCH: 30.9 pg (ref 26.0–34.0)
MCHC: 32.8 g/dL (ref 30.0–36.0)
MCV: 94 fL (ref 78.0–100.0)
Platelets: 294 10*3/uL (ref 150–400)
RBC: 3.66 MIL/uL — ABNORMAL LOW (ref 4.22–5.81)
RDW: 15.6 % — ABNORMAL HIGH (ref 11.5–15.5)
WBC: 12.3 10*3/uL — AB (ref 4.0–10.5)

## 2017-11-13 LAB — GLUCOSE, CAPILLARY
GLUCOSE-CAPILLARY: 93 mg/dL (ref 65–99)
GLUCOSE-CAPILLARY: 93 mg/dL (ref 65–99)
Glucose-Capillary: 116 mg/dL — ABNORMAL HIGH (ref 65–99)
Glucose-Capillary: 102 mg/dL — ABNORMAL HIGH (ref 65–99)
Glucose-Capillary: 145 mg/dL — ABNORMAL HIGH (ref 65–99)

## 2017-11-13 LAB — APTT: aPTT: 64 seconds — ABNORMAL HIGH (ref 24–36)

## 2017-11-13 LAB — HEPARIN LEVEL (UNFRACTIONATED)
Heparin Unfractionated: 0.14 IU/mL — ABNORMAL LOW (ref 0.30–0.70)
Heparin Unfractionated: 0.29 IU/mL — ABNORMAL LOW (ref 0.30–0.70)

## 2017-11-13 MED ORDER — HEPARIN BOLUS VIA INFUSION
2000.0000 [IU] | Freq: Once | INTRAVENOUS | Status: AC
  Administered 2017-11-13: 2000 [IU] via INTRAVENOUS
  Filled 2017-11-13: qty 2000

## 2017-11-13 NOTE — Progress Notes (Signed)
ANTICOAGULATION CONSULT NOTE - Follow Up Consult  Pharmacy Consult for heparin Indication: atrial fibrillation  Labs: Recent Labs    11/11/17 0512  11/11/17 1309 11/11/17 2340 11/12/17 0753 11/13/17 0423  HGB 10.6*  --   --   --  11.2* 11.3*  HCT 31.8*  --   --   --  34.5* 34.4*  PLT 206  --   --   --  249 294  APTT  --    < > 40* 93* 69* 64*  HEPARINUNFRC  --   --  0.84*  --  0.47 0.14*  CREATININE 1.71*  --   --   --  1.51* 1.27*   < > = values in this interval not displayed.    Assessment: 84yo male now subtherapeutic on heparin after two PTTs at goal, likely effect from Eliquis wearing off.  Goal of Therapy:  Heparin level 0.3-0.7 units/ml   Plan:  Will give small heparin bolus of 2000 units and increase heparin gtt by 3 units/kg/hr to 1300 units/hr and check level in 8 hours.    Wynona Neat, PharmD, BCPS  11/13/2017,6:09 AM

## 2017-11-13 NOTE — Progress Notes (Signed)
PROGRESS NOTE    [name]  AJG:[id number] DOB: [birth year] DOA: 11/10/2017 PCP: [name], MD   Brief Narrative:  HPI on 11/10/2017 by [name] [name] Ignatius Kloos. is a 84 y.o. male with medical history significant of HTNl HLD; PVD s/p RLE angioplasty; afib on AC; OSA; DM; stage 3 CKD; and CAD s/p PCI presenting with left foot cellulitis.  When asked why he came to the hospital, he said, "The doctor said I needed to".  He has a sore on his foot and "it's real red, raised, and they're afraid it's gonna go in deep if he don't get something done about it".  It's been there "a while".  No fevers.  He feels well otherwise.  He previously had an infected blister on his right foot for which he was followed by podiatry.  He was seen yesterday by [name] for this issue and started on Bactrim and Augmentin.  He was seen again today by [name] and sent to the ER due to progressive infection.  Assessment & Plan   Left lower extremity colitis and wound infection/PVD -Patient was placed on oral antibiotics, Augmentin and Bactrim prior to admission.  He states that his wound has been there for several weeks.  Patient was seeing a foot doctor for his right foot previously. -Left foot x-ray showed no acute osseous abnormalities -Obtained MRI without contrast of the left foot: Showing no septic arthritis or osteomyelitis.  Cellulitis without discrete drainable soft tissue abscess, mild fasciitis or pyomyositis -Was placed on vancomycin, Flagyl, ceftriaxone. Will discontinue vancomycin today. -CRP 7.1, ESR 78 -blood cultures show no growth to date -ABI R 0.91, L 0.89 (with monophasic waves??) -Cellulitis does appear to be improving -Discussed with [name], patient could still have osteo, but recommended vascular follow up -Vascular surgery consulted and appreciated; patient sees with vasc in [place], [place]-[name] and has intervention on RLE -Vascular surgery recommended  angiography- however this could be down inpatient vs outpatient- Dr. Trula Slade will discuss with the patient on 11/14/2017  Diabetes mellitus, type II -Currently diet controlled, last hemoglobin A1c was 6 on 09/07/2017  Atrial fibrillation -Eliquis currently held, placed on heparin in case of possible surgical intervention -Currently rate controlled, continue Lopressor  Essential hypertension -Continue metoprolol, amlodipine -Lisinopril and HCTZ currently held  Chronic kidney disease, stage III -Creatinine currently within stage III- creatinine 1.27 -Continue to monitor BMP  Leukocytosis -not sure why patient's WBC is increasing, currently 12.3 -currently on antibiotics -patient afebrile, with no complaints of urinary issues or upper respiratory issues -blood cultures shows no growth to date -will continue to monitor CBC  DVT Prophylaxis  heparin  Code Status: Full  Family Communication: None at bedside.  Disposition Plan: Admitted. Pending   Consultants Orthopedic surgery  Procedures  ABI  Antibiotics   Anti-infectives (From admission, onward)   Start     Dose/Rate Route Frequency Ordered Stop   11/11/17 1300  vancomycin (VANCOCIN) IVPB 750 mg/150 ml premix     750 mg 150 mL/hr over 60 Minutes Intravenous Every 24 hours 11/10/17 1229     11/10/17 1830  cefTRIAXone (ROCEPHIN) 2 g in sodium chloride 0.9 % 100 mL IVPB     2 g 200 mL/hr over 30 Minutes Intravenous Every 24 hours 11/10/17 1737     11/10/17 1830  metroNIDAZOLE (FLAGYL) IVPB 500 mg     500 mg 100 mL/hr over 60 Minutes Intravenous Every 8 hours 11/10/17 1737  11/10/17 1300  vancomycin (VANCOCIN) 1,250 mg in sodium chloride 0.9 % 250 mL IVPB     1,250 mg 166.7 mL/hr over 90 Minutes Intravenous  Once 11/10/17 1229 11/10/17 1527   11/10/17 1215  piperacillin-tazobactam (ZOSYN) IVPB 3.375 g     3.375 g 100 mL/hr over 30 Minutes Intravenous  Once 11/10/17 1211 11/10/17 1356      Subjective:   [name] seen and examined today.  Feels pain in his left leg and foot have improved.  Feels redness is also improved.  Denies any current chest pain, shortness breath, abdominal pain, nausea or vomiting, diarrhea or constipation, dizziness or headache.    Objective:   Vitals:   11/12/17 1801 11/12/17 2045 11/13/17 0622 11/13/17 1001  BP: (!) 155/69 140/63 (!) 143/66 (!) 141/61  Pulse: 64 (!) 51 (!) 56 (!) 50  Resp: '18  14 16  ' Temp: 97.7 F (36.5 C) 98.3 F (36.8 C) 98.3 F (36.8 C) 98.1 F (36.7 C)  TempSrc: Oral Oral Oral Oral  SpO2: 100% 100% 99% 100%  Weight:      Height:        Intake/Output Summary (Last 24 hours) at 11/13/2017 1126 Last data filed at 11/13/2017 0935 Gross per 24 hour  Intake 2754.56 ml  Output 0 ml  Net 2754.56 ml   Filed Weights   11/10/17 1733 11/10/17 2122  Weight: 79 kg (174 lb 2.6 oz) 78.8 kg (173 lb 11.6 oz)   Exam  General: Well developed, well nourished, NAD, appears stated age  HEENT: NCAT, mucous membranes moist.   Neck: Supple  Cardiovascular: S1 S2 auscultated, SEM, irregular  Respiratory: Clear to auscultation bilaterally with equal chest rise  Abdomen: Soft, nontender, nondistended, + bowel sounds   Extremities: warm dry without cyanosis clubbing or edema. Left great toe wound, erythema improving on LLE. Right lateral foot ucler  Neuro: AAOx3, nonfocal  Psych: appropriate mood and affect, pleasant  Data Reviewed: I have personally reviewed following labs and imaging studies  CBC: Recent Labs  Lab 11/10/17 1036 11/11/17 0512 11/12/17 0753 11/13/17 0423  WBC 11.2* 10.2 10.8* 12.3*  NEUTROABS 7.3  --   --   --   HGB 11.2* 10.6* 11.2* 11.3*  HCT 34.2* 31.8* 34.5* 34.4*  MCV 93.2 93.0 94.0 94.0  PLT 221 206 249 620   Basic Metabolic Panel: Recent Labs  Lab 11/10/17 1036 11/11/17 0512 11/12/17 0753 11/13/17 0423  NA 130* 134* 135 136  K 5.7* 4.9 4.1 4.5  CL 98* 106 107 105  CO2 23 19* 19* 20*  GLUCOSE 87 86 113*  95  BUN 32* 27* 26* 28*  CREATININE 1.79* 1.71* 1.51* 1.27*  CALCIUM 9.4 8.7* 9.2 9.6   GFR: Estimated Creatinine Clearance: 39.8 mL/min (A) (by C-G formula based on SCr of 1.27 mg/dL (H)). Liver Function Tests: Recent Labs  Lab 11/10/17 1036  AST 38  ALT 19  ALKPHOS 38  BILITOT 0.8  PROT 6.9  ALBUMIN 3.3*   No results for input(s): LIPASE, AMYLASE in the last 168 hours. No results for input(s): AMMONIA in the last 168 hours. Coagulation Profile: No results for input(s): INR, PROTIME in the last 168 hours. Cardiac Enzymes: No results for input(s): CKTOTAL, CKMB, CKMBINDEX, TROPONINI in the last 168 hours. BNP (last 3 results) No results for input(s): PROBNP in the last 8760 hours. HbA1C: No results for input(s): HGBA1C in the last 72 hours. CBG: Recent Labs  Lab 11/12/17 1144 11/12/17 1641 11/12/17 2047  11/13/17 0819 11/13/17 0837  GLUCAP 123* 112* 166* 93 93   Lipid Profile: No results for input(s): CHOL, HDL, LDLCALC, TRIG, CHOLHDL, LDLDIRECT in the last 72 hours. Thyroid Function Tests: No results for input(s): TSH, T4TOTAL, FREET4, T3FREE, THYROIDAB in the last 72 hours. Anemia Panel: No results for input(s): VITAMINB12, FOLATE, FERRITIN, TIBC, IRON, RETICCTPCT in the last 72 hours. Urine analysis:    Component Value Date/Time   COLORURINE YELLOW 11/10/2017 [place] 11/10/2017 1545   LABSPEC 1.009 11/10/2017 1545   PHURINE 6.0 11/10/2017 1545   GLUCOSEU NEGATIVE 11/10/2017 1545   HGBUR NEGATIVE 11/10/2017 1545   HGBUR trace-lysed 02/07/2008 1101   BILIRUBINUR NEGATIVE 11/10/2017 1545   KETONESUR NEGATIVE 11/10/2017 1545   PROTEINUR NEGATIVE 11/10/2017 1545   UROBILINOGEN 0.2 02/07/2008 1101   NITRITE NEGATIVE 11/10/2017 1545   LEUKOCYTESUR NEGATIVE 11/10/2017 1545   Sepsis Labs: '@[email or web address]' (procalcitonin:4,lacticidven:4)  ) Recent Results (from the past 240 hour(s))  Blood culture (routine x 2)     Status: None (Preliminary result)    Collection Time: 11/10/17  1:08 PM  Result Value Ref Range Status   Specimen Description BLOOD RIGHT ANTECUBITAL  Final   Special Requests   Final    BOTTLES DRAWN AEROBIC AND ANAEROBIC Blood Culture adequate volume   Culture   Final    NO GROWTH 2 DAYS Performed at [facility], [place] [address], [place], [place] [address]    Report Status PENDING  Incomplete  Blood culture (routine x 2)     Status: None (Preliminary result)   Collection Time: 11/10/17  1:08 PM  Result Value Ref Range Status   Specimen Description BLOOD BLOOD RIGHT WRIST  Final   Special Requests   Final    BOTTLES DRAWN AEROBIC ONLY Blood Culture adequate volume   Culture   Final    NO GROWTH 2 DAYS Performed at [facility], [place] [address], [place], [place] [address]    Report Status PENDING  Incomplete      Radiology Studies: No results found.   Scheduled Meds: . amLODipine  10 mg Oral Daily  . docusate sodium  100 mg Oral BID  . metoprolol tartrate  50 mg Oral BID  . multivitamin  15 mL Oral Daily  . nutrition supplement (JUVEN)  1 packet Oral BID BM  . rosuvastatin  40 mg Oral Daily  . silver sulfADIAZINE   Topical Daily  . sodium chloride flush  10-40 mL Intracatheter Q12H   Continuous Infusions: . cefTRIAXone (ROCEPHIN)  IV 2 g (11/12/17 1741)  . heparin 1,300 Units/hr (11/13/17 0102)  . lactated ringers 50 mL/hr at 11/12/17 2146  . metronidazole Stopped (11/13/17 0251)  . vancomycin 750 mg (11/12/17 1313)     LOS: 3 days   Time Spent in minutes   30 minutes  Zarria Towell D.O. on 11/13/2017 at 11:26 AM  Between 7am to 7pm - Pager - [phone]  After 7pm go to [email or web address] - password [email or web address]  And look for the night coverage person covering for me after hours  [facility] Office  [phone]

## 2017-11-13 NOTE — Progress Notes (Signed)
ANTICOAGULATION CONSULT NOTE - [place] for Heparin (while Apixaban held) Indication:  H/o Afib   Patient Measurements: Height: 5' 5.2" (165.6 cm) Weight: 173 lb 11.6 oz (78.8 kg) IBW/kg (Calculated) : 61.96 Heparin Dosing Weight: 78 kg  Vital Signs: Temp: 98.1 F (36.7 C) (04/06 1001) Temp Source: Oral (04/06 1001) BP: 141/61 (04/06 1001) Pulse Rate: 50 (04/06 1001)  Labs: Recent Labs    11/11/17 0512  11/11/17 2340 11/12/17 0753 11/13/17 0423 11/13/17 1440  HGB 10.6*  --   --  11.2* 11.3*  --   HCT 31.8*  --   --  34.5* 34.4*  --   PLT 206  --   --  249 294  --   APTT  --    < > 93* 69* 64*  --   HEPARINUNFRC  --    < >  --  0.47 0.14* 0.29*  CREATININE 1.71*  --   --  1.51* 1.27*  --    < > = values in this interval not displayed.    Estimated Creatinine Clearance: 39.8 mL/min (A) (by C-G formula based on SCr of 1.27 mg/dL (H)).   Medical History: Past Medical History:  Diagnosis Date  . Atrial flutter ([place])    s/p RFCA  . CAD (coronary artery disease)    cath 2003, occluded S-RCA, occluded S-Dx, L-LAD ok, s/p PTCA to LAD  . Cataract   . CKD (chronic kidney disease) stage 3, GFR 30-59 ml/min (HCC)   . Diabetes mellitus    diet controlled  . Hearing aid worn    bilateral  . Long term (current) use of anticoagulants   . Neuromuscular disorder ([place])   . OSA (obstructive sleep apnea) 12/11   very mild, AHI 7/hr  . Persistent atrial fibrillation ([place]) 09/26/2015  . Pure hyperglyceridemia   . PVD (peripheral vascular disease) ([place])    angioplasty of his right lower extremity in [place] by Dr.[name] 2013  . Unspecified essential hypertension   . Wears dentures    full upper and lower    Assessment: 84 y.o male admitted 11/10/17 with left foot cellulitis.  He takes apixaban prior to admission for h/o Afib.  Apixaban last given on 4/3 at 21:23 on admit. Now Apixaban held for possible need for surgery for L foot infection. Ortho  consult pending. H/o CKD stage 3, Scr 1.27.  Heparin level slightly subtherapeutic this afternoon, CBC stable. No bleeding noted.  Goal of Therapy:  Heparin level 0.3-0.7 Monitor platelets by anticoagulation protocol: Yes   Plan:  Increase IV heparin drip at 1450 units/hr  Daily heparin level, CBC, monitor signs/symptoms of bleeding   Choua Chalker L. Kyung Rudd, PharmD, [place] PGY1 Pharmacy Resident Pager: [phone]

## 2017-11-14 LAB — HEPARIN LEVEL (UNFRACTIONATED)
Heparin Unfractionated: 0.39 IU/mL (ref 0.30–0.70)
Heparin Unfractionated: 0.67 IU/mL (ref 0.30–0.70)
Heparin Unfractionated: 2.04 IU/mL — ABNORMAL HIGH (ref 0.30–0.70)

## 2017-11-14 LAB — CBC
HEMATOCRIT: 31.8 % — AB (ref 39.0–52.0)
HEMOGLOBIN: 10.5 g/dL — AB (ref 13.0–17.0)
MCH: 30.7 pg (ref 26.0–34.0)
MCHC: 33 g/dL (ref 30.0–36.0)
MCV: 93 fL (ref 78.0–100.0)
Platelets: 247 10*3/uL (ref 150–400)
RBC: 3.42 MIL/uL — AB (ref 4.22–5.81)
RDW: 15.3 % (ref 11.5–15.5)
WBC: 9.9 10*3/uL (ref 4.0–10.5)

## 2017-11-14 LAB — GLUCOSE, CAPILLARY
GLUCOSE-CAPILLARY: 107 mg/dL — AB (ref 65–99)
GLUCOSE-CAPILLARY: 107 mg/dL — AB (ref 65–99)
GLUCOSE-CAPILLARY: 158 mg/dL — AB (ref 65–99)
Glucose-Capillary: 126 mg/dL — ABNORMAL HIGH (ref 65–99)

## 2017-11-14 NOTE — Progress Notes (Signed)
PROGRESS NOTE    Eric [name].  LXB:[id number] DOB: 03-14-1928 DOA: 11/10/2017 PCP: [name], MD   Brief Narrative:  HPI on 11/10/2017 by [name] Eric [name]. is a 84 y.o. male with medical history significant of HTNl HLD; PVD s/p RLE angioplasty; afib on AC; OSA; DM; stage 3 CKD; and CAD s/p PCI presenting with left foot cellulitis.  When asked why he came to the hospital, he said, "The doctor said I needed to".  He has a sore on his foot and "it's real red, raised, and they're afraid it's gonna go in deep if he don't get something done about it".  It's been there "a while".  No fevers.  He feels well otherwise.  He previously had an infected blister on his right foot for which he was followed by podiatry.  He was seen yesterday by [name] for this issue and started on Bactrim and Augmentin.  He was seen again today by [name] and sent to the ER due to progressive infection.  Assessment & Plan   Left lower extremity colitis and wound infection/PVD -Patient was placed on oral antibiotics, Augmentin and Bactrim prior to admission.  He states that his wound has been there for several weeks.  Patient was seeing a foot doctor for his right foot previously. -Left foot x-ray showed no acute osseous abnormalities -Obtained MRI without contrast of the left foot: Showing no septic arthritis or osteomyelitis.  Cellulitis without discrete drainable soft tissue abscess, mild fasciitis or pyomyositis -Was placed on vancomycin, Flagyl, ceftriaxone. Will discontinue vancomycin today. -CRP 7.1, ESR 78 -blood cultures show no growth to date -ABI R 0.91, L 0.89 (with monophasic waves??) -Cellulitis does appear to be improving -Discussed with [name], patient could still have osteo, but recommended vascular follow up -Vascular surgery consulted and appreciated; patient sees with vasc in [place], [place]-[name] and has intervention on RLE -Vascular surgery recommended  angiography- however this could be down inpatient vs outpatient- Dr. Trula Slade will discuss with the patient on 11/14/2017 -patient is decided that he would like to move forward with angiography on 11/15/2017 while admitted  Diabetes mellitus, type II -Currently diet controlled, last hemoglobin A1c was 6 on 09/07/2017  Atrial fibrillation -Eliquis currently held, placed on heparin in case of possible surgical intervention -Continue metoprolol   Essential hypertension -Continue metoprolol, amlodipine -Lisinopril and HCTZ currently held  Chronic kidney disease, stage III -Creatinine currently within stage III- creatinine 1.27 -Continue to monitor BMP  Leukocytosis -Resolved, currently 9.9 -currently on antibiotics -patient afebrile, with no complaints of urinary issues or upper respiratory issues -blood cultures shows no growth to date -Continue to monitor CBC  DVT Prophylaxis  heparin  Code Status: Full  Family Communication: None at bedside.  Disposition Plan: Admitted. Pending angiography on 11/15/2017  Consultants Orthopedic surgery  Procedures  ABI  Antibiotics   Anti-infectives (From admission, onward)   Start     Dose/Rate Route Frequency Ordered Stop   11/11/17 1300  vancomycin (VANCOCIN) IVPB 750 mg/150 ml premix  Status:  Discontinued     750 mg 150 mL/hr over 60 Minutes Intravenous Every 24 hours 11/10/17 1229 11/13/17 1134   11/10/17 1830  cefTRIAXone (ROCEPHIN) 2 g in sodium chloride 0.9 % 100 mL IVPB     2 g 200 mL/hr over 30 Minutes Intravenous Every 24 hours 11/10/17 1737     11/10/17 1830  metroNIDAZOLE (FLAGYL) IVPB 500 mg     500 mg  100 mL/hr over 60 Minutes Intravenous Every 8 hours 11/10/17 1737     11/10/17 1300  vancomycin (VANCOCIN) 1,250 mg in sodium chloride 0.9 % 250 mL IVPB     1,250 mg 166.7 mL/hr over 90 Minutes Intravenous  Once 11/10/17 1229 11/10/17 1527   11/10/17 1215  piperacillin-tazobactam (ZOSYN) IVPB 3.375 g     3.375 g 100 mL/hr  over 30 Minutes Intravenous  Once 11/10/17 1211 11/10/17 1356      Subjective:   Eric [name] seen and examined today.  Feels his foot pain and redness have improved. Denies chest pain, shortness of breath, abdominal pain, nausea or vomiting, diarrhea or constipation.     Objective:   Vitals:   11/13/17 1722 11/13/17 2044 11/14/17 0601 11/14/17 0936  BP: (!) 147/65 127/65 (!) 162/70 130/74  Pulse: (!) 53 (!) 54 (!) 49 (!) 56  Resp: '18 18 19 16  ' Temp: 97.9 F (36.6 C) 98.4 F (36.9 C) 98.2 F (36.8 C) 98.4 F (36.9 C)  TempSrc: Oral Oral Oral Oral  SpO2: 100% 100% 100% 100%  Weight:  78.9 kg (174 lb)    Height:        Intake/Output Summary (Last 24 hours) at 11/14/2017 1121 Last data filed at 11/14/2017 0900 Gross per 24 hour  Intake 2307.06 ml  Output 0 ml  Net 2307.06 ml   Filed Weights   11/10/17 1733 11/10/17 2122 11/13/17 2044  Weight: 79 kg (174 lb 2.6 oz) 78.8 kg (173 lb 11.6 oz) 78.9 kg (174 lb)   Exam  General: Well developed, well nourished, NAD, appears stated age  HEENT: NCAT, mucous membranes moist.   Neck: Supple  Cardiovascular: S1 S2 auscultated, SEM, IRR  Respiratory: Clear to auscultation bilaterally with equal chest rise  Abdomen: Soft, nontender, nondistended, + bowel sounds  Extremities: warm dry without cyanosis clubbing.  Left great toe wound/eschar, erythema improving on left lower extremity and foot.  Right lateral foot ulcer  Neuro: AAOx3, nonfocal  Psych: pleasant, appropriate mood and affect  Data Reviewed: I have personally reviewed following labs and imaging studies  CBC: Recent Labs  Lab 11/10/17 1036 11/11/17 0512 11/12/17 0753 11/13/17 0423 11/14/17 0359  WBC 11.2* 10.2 10.8* 12.3* 9.9  NEUTROABS 7.3  --   --   --   --   HGB 11.2* 10.6* 11.2* 11.3* 10.5*  HCT 34.2* 31.8* 34.5* 34.4* 31.8*  MCV 93.2 93.0 94.0 94.0 93.0  PLT 221 206 249 294 585   Basic Metabolic Panel: Recent Labs  Lab 11/10/17 1036 11/11/17 0512  11/12/17 0753 11/13/17 0423  NA 130* 134* 135 136  K 5.7* 4.9 4.1 4.5  CL 98* 106 107 105  CO2 23 19* 19* 20*  GLUCOSE 87 86 113* 95  BUN 32* 27* 26* 28*  CREATININE 1.79* 1.71* 1.51* 1.27*  CALCIUM 9.4 8.7* 9.2 9.6   GFR: Estimated Creatinine Clearance: 39.9 mL/min (A) (by C-G formula based on SCr of 1.27 mg/dL (H)). Liver Function Tests: Recent Labs  Lab 11/10/17 1036  AST 38  ALT 19  ALKPHOS 38  BILITOT 0.8  PROT 6.9  ALBUMIN 3.3*   No results for input(s): LIPASE, AMYLASE in the last 168 hours. No results for input(s): AMMONIA in the last 168 hours. Coagulation Profile: No results for input(s): INR, PROTIME in the last 168 hours. Cardiac Enzymes: No results for input(s): CKTOTAL, CKMB, CKMBINDEX, TROPONINI in the last 168 hours. BNP (last 3 results) No results for input(s): PROBNP in the  last 8760 hours. HbA1C: No results for input(s): HGBA1C in the last 72 hours. CBG: Recent Labs  Lab 11/13/17 0837 11/13/17 1144 11/13/17 1645 11/13/17 2045 11/14/17 0732  GLUCAP 93 116* 102* 145* 107*   Lipid Profile: No results for input(s): CHOL, HDL, LDLCALC, TRIG, CHOLHDL, LDLDIRECT in the last 72 hours. Thyroid Function Tests: No results for input(s): TSH, T4TOTAL, FREET4, T3FREE, THYROIDAB in the last 72 hours. Anemia Panel: No results for input(s): VITAMINB12, FOLATE, FERRITIN, TIBC, IRON, RETICCTPCT in the last 72 hours. Urine analysis:    Component Value Date/Time   COLORURINE YELLOW 11/10/2017 [place] 11/10/2017 1545   LABSPEC 1.009 11/10/2017 1545   PHURINE 6.0 11/10/2017 1545   GLUCOSEU NEGATIVE 11/10/2017 1545   HGBUR NEGATIVE 11/10/2017 1545   HGBUR trace-lysed 02/07/2008 1101   BILIRUBINUR NEGATIVE 11/10/2017 1545   KETONESUR NEGATIVE 11/10/2017 1545   PROTEINUR NEGATIVE 11/10/2017 1545   UROBILINOGEN 0.2 02/07/2008 1101   NITRITE NEGATIVE 11/10/2017 1545   LEUKOCYTESUR NEGATIVE 11/10/2017 1545   Sepsis  Labs: '@LABRCNTIP' (procalcitonin:4,lacticidven:4)  ) Recent Results (from the past 240 hour(s))  Blood culture (routine x 2)     Status: None (Preliminary result)   Collection Time: 11/10/17  1:08 PM  Result Value Ref Range Status   Specimen Description BLOOD RIGHT ANTECUBITAL  Final   Special Requests   Final    BOTTLES DRAWN AEROBIC AND ANAEROBIC Blood Culture adequate volume   Culture   Final    NO GROWTH 3 DAYS Performed at [facility], [place] [address], [place], [place] [address]    Report Status PENDING  Incomplete  Blood culture (routine x 2)     Status: None (Preliminary result)   Collection Time: 11/10/17  1:08 PM  Result Value Ref Range Status   Specimen Description BLOOD BLOOD RIGHT WRIST  Final   Special Requests   Final    BOTTLES DRAWN AEROBIC ONLY Blood Culture adequate volume   Culture   Final    NO GROWTH 3 DAYS Performed at [facility], [place] [address], [place],  [address]    Report Status PENDING  Incomplete      Radiology Studies: No results found.   Scheduled Meds: . amLODipine  10 mg Oral Daily  . docusate sodium  100 mg Oral BID  . metoprolol tartrate  50 mg Oral BID  . multivitamin  15 mL Oral Daily  . nutrition supplement (JUVEN)  1 packet Oral BID BM  . rosuvastatin  40 mg Oral Daily  . silver sulfADIAZINE   Topical Daily  . sodium chloride flush  10-40 mL Intracatheter Q12H   Continuous Infusions: . cefTRIAXone (ROCEPHIN)  IV 2 g (11/13/17 1647)  . heparin 1,400 Units/hr (11/14/17 0905)  . metronidazole 500 mg (11/14/17 1020)     LOS: 4 days   Time Spent in minutes   30 minutes  Hensley Treat D.O. on 11/14/2017 at 11:21 AM  Between 7am to 7pm - Pager - [phone]  After 7pm go to [email or web address] - password [email or web address]  And look for the night coverage person covering for me after hours  [facility] Office  [phone]

## 2017-11-14 NOTE — Progress Notes (Signed)
ANTICOAGULATION CONSULT NOTE  Pharmacy Consult for Heparin (while Apixaban held) Indication:  H/o Afib   Patient Measurements: Height: 5' 5.2" (165.6 cm) Weight: 174 lb (78.9 kg) IBW/kg (Calculated) : 61.96 Heparin Dosing Weight: 78 kg  Vital Signs: Temp: 98.4 F (36.9 C) (04/06 2044) Temp Source: Oral (04/06 2044) BP: 127/65 (04/06 2044) Pulse Rate: 54 (04/06 2044)  Labs: Recent Labs    11/11/17 0512  11/11/17 2340 11/12/17 0753 11/13/17 0423 11/13/17 1440 11/14/17 0006  HGB 10.6*  --   --  11.2* 11.3*  --   --   HCT 31.8*  --   --  34.5* 34.4*  --   --   PLT 206  --   --  249 294  --   --   APTT  --    < > 93* 69* 64*  --   --   HEPARINUNFRC  --    < >  --  0.47 0.14* 0.29* 0.67  CREATININE 1.71*  --   --  1.51* 1.27*  --   --    < > = values in this interval not displayed.    Assessment: 84 y.o male admitted 11/10/17 with left foot cellulitis.  He takes apixaban prior to admission for h/o Afib.  Apixaban last given on 4/3 at 21:23 on admit. Now Apixaban held for possible need for surgery for L foot infection. Ortho consult pending. H/o CKD stage 3, Scr 1.27.  Heparin level is wnl. CBC ok.  Goal of Therapy:  Heparin level 0.3-0.7 Monitor platelets by anticoagulation protocol: Yes   Plan:  Continue heparin drip at 1450 units/hr  Daily heparin level, CBC, monitor signs/symptoms of bleeding   Harvel Quale 11/14/2017 1:08 AM

## 2017-11-14 NOTE — H&P (View-Only) (Signed)
    Subjective  -   No interval changes. States his left foot feel better   Physical Exam:  Stable erythema and eschar to the left great toe       Assessment/Plan:    Discussed proceeding with angiography and intervention at [facility] tomorrow.  Continue IV heparin until on call to cath lab.  Details of procedure discussed with patient.  All questions answered.  This will be from a right femoral approach.  Wells Brabham 11/14/2017 11:25 AM --  Vitals:   11/14/17 0601 11/14/17 0936  BP: (!) 162/70 130/74  Pulse: (!) 49 (!) 56  Resp: 19 16  Temp: 98.2 F (36.8 C) 98.4 F (36.9 C)  SpO2: 100% 100%    Intake/Output Summary (Last 24 hours) at 11/14/2017 1125 Last data filed at 11/14/2017 0900 Gross per 24 hour  Intake 2307.06 ml  Output 0 ml  Net 2307.06 ml     Laboratory CBC    Component Value Date/Time   WBC 9.9 11/14/2017 0359   HGB 10.5 (L) 11/14/2017 0359   HCT 31.8 (L) 11/14/2017 0359   PLT 247 11/14/2017 0359    BMET    Component Value Date/Time   NA 136 11/13/2017 0423   NA 139 04/28/2012 0658   K 4.5 11/13/2017 0423   K 4.2 04/28/2012 0658   CL 105 11/13/2017 0423   CL 106 04/28/2012 0658   CO2 20 (L) 11/13/2017 0423   CO2 25 04/28/2012 0658   GLUCOSE 95 11/13/2017 0423   GLUCOSE 114 (H) 04/28/2012 0658   BUN 28 (H) 11/13/2017 0423   BUN 32 (H) 04/28/2012 0658   CREATININE 1.27 (H) 11/13/2017 0423   CREATININE 1.32 (H) 08/16/2017 0802   CALCIUM 9.6 11/13/2017 0423   CALCIUM 8.9 04/28/2012 0658   GFRNONAA 49 (L) 11/13/2017 0423   GFRNONAA 48 (L) 08/16/2017 0802   GFRAA 57 (L) 11/13/2017 0423   GFRAA 56 (L) 08/16/2017 0802    COAG Lab Results  Component Value Date   INR 2.8 09/06/2015   INR 3.2 08/08/2015   INR 1.67 06/27/2015   PROTIME 19.9 01/10/2009   No results found for: PTT  Antibiotics Anti-infectives (From admission, onward)   Start     Dose/Rate Route Frequency Ordered Stop   11/11/17 1300  vancomycin (VANCOCIN) IVPB 750  mg/150 ml premix  Status:  Discontinued     750 mg 150 mL/hr over 60 Minutes Intravenous Every 24 hours 11/10/17 1229 11/13/17 1134   11/10/17 1830  cefTRIAXone (ROCEPHIN) 2 g in sodium chloride 0.9 % 100 mL IVPB     2 g 200 mL/hr over 30 Minutes Intravenous Every 24 hours 11/10/17 1737     11/10/17 1830  metroNIDAZOLE (FLAGYL) IVPB 500 mg     500 mg 100 mL/hr over 60 Minutes Intravenous Every 8 hours 11/10/17 1737     11/10/17 1300  vancomycin (VANCOCIN) 1,250 mg in sodium chloride 0.9 % 250 mL IVPB     1,250 mg 166.7 mL/hr over 90 Minutes Intravenous  Once 11/10/17 1229 11/10/17 1527   11/10/17 1215  piperacillin-tazobactam (ZOSYN) IVPB 3.375 g     3.375 g 100 mL/hr over 30 Minutes Intravenous  Once 11/10/17 1211 11/10/17 1356       V. Leia Alf, M.D. [facility] [place] Office: [phone] Pager:  [phone]

## 2017-11-14 NOTE — Progress Notes (Signed)
    Subjective  -   No interval changes. States his left foot feel better   Physical Exam:  Stable erythema and eschar to the left great toe       Assessment/Plan:    Discussed proceeding with angiography and intervention at [facility] tomorrow.  Continue IV heparin until on call to cath lab.  Details of procedure discussed with patient.  All questions answered.  This will be from a right femoral approach.  Eric [name] 11/14/2017 11:25 AM --  Vitals:   11/14/17 0601 11/14/17 0936  BP: (!) 162/70 130/74  Pulse: (!) 49 (!) 56  Resp: 19 16  Temp: 98.2 F (36.8 C) 98.4 F (36.9 C)  SpO2: 100% 100%    Intake/Output Summary (Last 24 hours) at 11/14/2017 1125 Last data filed at 11/14/2017 0900 Gross per 24 hour  Intake 2307.06 ml  Output 0 ml  Net 2307.06 ml     Laboratory CBC    Component Value Date/Time   WBC 9.9 11/14/2017 0359   HGB 10.5 (L) 11/14/2017 0359   HCT 31.8 (L) 11/14/2017 0359   PLT 247 11/14/2017 0359    BMET    Component Value Date/Time   NA 136 11/13/2017 0423   NA 139 04/28/2012 0658   K 4.5 11/13/2017 0423   K 4.2 04/28/2012 0658   CL 105 11/13/2017 0423   CL 106 04/28/2012 0658   CO2 20 (L) 11/13/2017 0423   CO2 25 04/28/2012 0658   GLUCOSE 95 11/13/2017 0423   GLUCOSE 114 (H) 04/28/2012 0658   BUN 28 (H) 11/13/2017 0423   BUN 32 (H) 04/28/2012 0658   CREATININE 1.27 (H) 11/13/2017 0423   CREATININE 1.32 (H) 08/16/2017 0802   CALCIUM 9.6 11/13/2017 0423   CALCIUM 8.9 04/28/2012 0658   GFRNONAA 49 (L) 11/13/2017 0423   GFRNONAA 48 (L) 08/16/2017 0802   GFRAA 57 (L) 11/13/2017 0423   GFRAA 56 (L) 08/16/2017 0802    COAG Lab Results  Component Value Date   INR 2.8 09/06/2015   INR 3.2 08/08/2015   INR 1.67 06/27/2015   PROTIME 19.9 01/10/2009   No results found for: PTT  Antibiotics Anti-infectives (From admission, onward)   Start     Dose/Rate Route Frequency Ordered Stop   11/11/17 1300  vancomycin (VANCOCIN) IVPB 750  mg/150 ml premix  Status:  Discontinued     750 mg 150 mL/hr over 60 Minutes Intravenous Every 24 hours 11/10/17 1229 11/13/17 1134   11/10/17 1830  cefTRIAXone (ROCEPHIN) 2 g in sodium chloride 0.9 % 100 mL IVPB     2 g 200 mL/hr over 30 Minutes Intravenous Every 24 hours 11/10/17 1737     11/10/17 1830  metroNIDAZOLE (FLAGYL) IVPB 500 mg     500 mg 100 mL/hr over 60 Minutes Intravenous Every 8 hours 11/10/17 1737     11/10/17 1300  vancomycin (VANCOCIN) 1,250 mg in sodium chloride 0.9 % 250 mL IVPB     1,250 mg 166.7 mL/hr over 90 Minutes Intravenous  Once 11/10/17 1229 11/10/17 1527   11/10/17 1215  piperacillin-tazobactam (ZOSYN) IVPB 3.375 g     3.375 g 100 mL/hr over 30 Minutes Intravenous  Once 11/10/17 1211 11/10/17 1356       Eric [name], M.D. [facility] [place] Office: [phone] Pager:  [phone]

## 2017-11-14 NOTE — Progress Notes (Signed)
Messaged MD on call regarding Pulse of 48 and scheduled Lopressor. Awaiting response.   Eleanora Neighbor, RN

## 2017-11-14 NOTE — Progress Notes (Signed)
ANTICOAGULATION CONSULT NOTE - [place] for Heparin (while Apixaban held) Indication:  H/o Afib   Patient Measurements: Height: 5' 5.2" (165.6 cm) Weight: 174 lb (78.9 kg) IBW/kg (Calculated) : 61.96 Heparin Dosing Weight: 78 kg  Vital Signs: Temp: 98.4 F (36.9 C) (04/07 0936) Temp Source: Oral (04/07 0936) BP: 130/74 (04/07 0936) Pulse Rate: 56 (04/07 0936)  Labs: Recent Labs    11/11/17 2340  11/12/17 0753 11/13/17 0423  11/14/17 0006 11/14/17 0339 11/14/17 0359 11/14/17 0813  HGB  --    < > 11.2* 11.3*  --   --   --  10.5*  --   HCT  --   --  34.5* 34.4*  --   --   --  31.8*  --   PLT  --   --  249 294  --   --   --  247  --   APTT 93*  --  69* 64*  --   --   --   --   --   HEPARINUNFRC  --   --  0.47 0.14*   < > 0.67 2.04*  --  0.39  CREATININE  --   --  1.51* 1.27*  --   --   --   --   --    < > = values in this interval not displayed.   Assessment: 84 y.o male admitted 11/10/17 with left foot cellulitis.  He takes apixaban prior to admission for h/o Afib.  Apixaban last given on 4/3 at 21:23 on admit. Now Apixaban held for possible need for surgery for L foot infection. Ortho consult pending. H/o CKD stage 3, Scr 1.27.  *0300 Heparin Level came back >2 after being therapeutic overnight. Got a STAT level to confirm after speaking with RN and came back therapeutic*   Heparin level therapeutic after verification this morning, CBC stable. No bleeding noted.  Goal of Therapy:  Heparin level 0.3-0.7 Monitor platelets by anticoagulation protocol: Yes   Plan:  Continue heparin 1,400 units/hr Daily heparin level, CBC, monitor signs/symptoms of bleeding   Diana L. Kyung Rudd, PharmD, [place] PGY1 Pharmacy Resident Pager: [phone]

## 2017-11-15 ENCOUNTER — Inpatient Hospital Stay (HOSPITAL_COMMUNITY)
Admission: EM | Disposition: A | Discharge status: Home / Self Care | Payer: Self-pay | Source: Home / Self Care | Attending: Internal Medicine

## 2017-11-15 HISTORY — PX: ABDOMINAL AORTOGRAM W/LOWER EXTREMITY: CATH118223

## 2017-11-15 LAB — GLUCOSE, CAPILLARY
GLUCOSE-CAPILLARY: 178 mg/dL — AB (ref 65–99)
Glucose-Capillary: 103 mg/dL — ABNORMAL HIGH (ref 65–99)
Glucose-Capillary: 86 mg/dL (ref 65–99)
Glucose-Capillary: 125 mg/dL — ABNORMAL HIGH (ref 65–99)

## 2017-11-15 LAB — CBC
HCT: 36.2 % — ABNORMAL LOW (ref 39.0–52.0)
HEMOGLOBIN: 11.6 g/dL — AB (ref 13.0–17.0)
MCH: 30 pg (ref 26.0–34.0)
MCHC: 32 g/dL (ref 30.0–36.0)
MCV: 93.5 fL (ref 78.0–100.0)
Platelets: 315 10*3/uL (ref 150–400)
RBC: 3.87 MIL/uL — AB (ref 4.22–5.81)
RDW: 15.3 % (ref 11.5–15.5)
WBC: 12.7 10*3/uL — ABNORMAL HIGH (ref 4.0–10.5)

## 2017-11-15 LAB — BASIC METABOLIC PANEL
ANION GAP: 11 (ref 5–15)
BUN: 23 mg/dL — ABNORMAL HIGH (ref 6–20)
CHLORIDE: 106 mmol/L (ref 101–111)
CO2: 19 mmol/L — AB (ref 22–32)
Calcium: 9.6 mg/dL (ref 8.9–10.3)
Creatinine, Ser: 1.14 mg/dL (ref 0.61–1.24)
GFR calc non Af Amer: 56 mL/min — ABNORMAL LOW (ref >60)
GLUCOSE: 95 mg/dL (ref 65–99)
Potassium: 3.9 mmol/L (ref 3.5–5.1)
Sodium: 136 mmol/L (ref 135–145)

## 2017-11-15 LAB — SURGICAL PCR SCREEN
MRSA, PCR: NEGATIVE
STAPHYLOCOCCUS AUREUS: NEGATIVE

## 2017-11-15 LAB — PROTIME-INR
INR: 1.27
PROTHROMBIN TIME: 15.8 s — AB (ref 11.4–15.2)

## 2017-11-15 LAB — CULTURE, BLOOD (ROUTINE X 2)
CULTURE: NO GROWTH
Culture: NO GROWTH
SPECIAL REQUESTS: ADEQUATE
Special Requests: ADEQUATE

## 2017-11-15 LAB — HEPARIN LEVEL (UNFRACTIONATED): Heparin Unfractionated: 0.64 IU/mL (ref 0.30–0.70)

## 2017-11-15 SURGERY — ABDOMINAL AORTOGRAM W/LOWER EXTREMITY
Anesthesia: LOCAL

## 2017-11-15 MED ORDER — HEPARIN (PORCINE) IN NACL 100-0.45 UNIT/ML-% IJ SOLN: 1400.0000 [IU]/h | INTRAMUSCULAR | Status: DC

## 2017-11-15 MED ORDER — LABETALOL HCL 5 MG/ML IV SOLN: 10.0000 mg | INTRAVENOUS | Status: DC | PRN

## 2017-11-15 MED ORDER — SODIUM CHLORIDE 0.9% FLUSH: 3.0000 mL | INTRAVENOUS | Status: DC | PRN

## 2017-11-15 MED ORDER — SODIUM CHLORIDE 0.9 % IV SOLN: 250.0000 mL | INTRAVENOUS | Status: DC | PRN

## 2017-11-15 MED ORDER — LIDOCAINE HCL 1 % IJ SOLN
INTRAMUSCULAR | Status: AC
  Filled 2017-11-15: qty 20

## 2017-11-15 MED ORDER — SODIUM CHLORIDE 0.9 % IV SOLN
INTRAVENOUS | Status: DC
  Administered 2017-11-15: 07:00:00 via INTRAVENOUS

## 2017-11-15 MED ORDER — SODIUM CHLORIDE 0.9 % IV SOLN: INTRAVENOUS | Status: AC

## 2017-11-15 MED ORDER — SODIUM CHLORIDE 0.9% FLUSH
3.0000 mL | Freq: Two times a day (BID) | INTRAVENOUS | Status: DC
  Administered 2017-11-15 – 2017-11-16 (×2): 3 mL via INTRAVENOUS

## 2017-11-15 MED ORDER — HEPARIN (PORCINE) IN NACL 100-0.45 UNIT/ML-% IJ SOLN
1400.0000 [IU]/h | INTRAMUSCULAR | Status: DC
  Administered 2017-11-15 – 2017-11-16 (×2): 1400 [IU]/h via INTRAVENOUS
  Filled 2017-11-15 (×2): qty 250

## 2017-11-15 MED ORDER — LIDOCAINE HCL (PF) 1 % IJ SOLN
INTRAMUSCULAR | Status: DC | PRN
  Administered 2017-11-15: 15 mL

## 2017-11-15 MED ORDER — HEPARIN (PORCINE) IN NACL 2-0.9 UNIT/ML-% IJ SOLN
INTRAMUSCULAR | Status: AC | PRN
  Administered 2017-11-15: 1000 mL via INTRA_ARTERIAL

## 2017-11-15 MED ORDER — HYDRALAZINE HCL 20 MG/ML IJ SOLN
5.0000 mg | INTRAMUSCULAR | Status: DC | PRN
  Administered 2017-11-15: 18:00:00 5 mg via INTRAVENOUS
  Filled 2017-11-15: qty 1

## 2017-11-15 MED ORDER — ONDANSETRON HCL 4 MG/2ML IJ SOLN: 4.0000 mg | Freq: Four times a day (QID) | INTRAMUSCULAR | Status: DC | PRN

## 2017-11-15 MED ORDER — IODIXANOL 320 MG/ML IV SOLN
INTRAVENOUS | Status: DC | PRN
  Administered 2017-11-15: 147 mL via INTRA_ARTERIAL

## 2017-11-15 MED ORDER — ACETAMINOPHEN 325 MG PO TABS: 650.0000 mg | ORAL_TABLET | ORAL | Status: DC | PRN

## 2017-11-15 MED ORDER — HEPARIN (PORCINE) IN NACL 2-0.9 UNIT/ML-% IJ SOLN
INTRAMUSCULAR | Status: AC
  Filled 2017-11-15: qty 1000

## 2017-11-15 SURGICAL SUPPLY — 12 items
CATH ANGIO 5F PIGTAIL 65CM (CATHETERS) ×1 IMPLANT
CATH STRAIGHT 5FR 65CM (CATHETERS) ×1 IMPLANT
CATH TEMPO 5F RIM 65CM (CATHETERS) ×1 IMPLANT
COVER PRB 48X5XTLSCP FOLD TPE (BAG) IMPLANT
COVER PROBE 5X48 (BAG) ×4
KIT PV (KITS) ×2 IMPLANT
SHEATH AVANTI 11CM 5FR (SHEATH) ×1 IMPLANT
SYR MEDRAD MARK V 150ML (SYRINGE) ×2 IMPLANT
TRANSDUCER W/STOPCOCK (MISCELLANEOUS) ×2 IMPLANT
TRAY PV CATH (CUSTOM PROCEDURE TRAY) ×2 IMPLANT
WIRE AQUATRAK .035X150 ANG (WIRE) ×1 IMPLANT
WIRE HITORQ VERSACORE ST 145CM (WIRE) ×1 IMPLANT

## 2017-11-15 NOTE — Op Note (Signed)
Procedure: Abdominal aortogram with bilateral lower extremity runoff third order catheterization left superficial femoral artery  Preoperative diagnosis: Cellulitis left foot  Postoperative diagnosis: Same  Anesthesia: Local  Operative findings: #1 primarily pedal disease with intact in-line flow left lower extremity one-vessel posterior tibial artery runoff  Operative details: After obtaining informed consent, the patient was taken the PV lab.  The patient was placed in supine position Angio table.  Both groins were prepped and draped in usual sterile fashion.  Local anesthesia was infiltrated over the right common femoral artery.  Ultrasound was used to identify the right common femoral artery and femoral bifurcation.  An introducer needle was then used to cannulate the right common femoral artery and 035 versacore wire threaded in the abdominal aorta under fluoroscopic guidance.  Next a 5 French sheath was placed over the guidewire in the right common femoral artery.  This was thoroughly flushed with heparinized saline.  At this point a 5 French pigtail catheter was advanced over the guidewire in the abdominal aorta and abdominal aortogram was obtained in AP projection.  Left and right renal arteries are widely patent.  The infrarenal abdominal aorta is patent.  The left and right common external and internal iliac arteries are widely patent.  Next the pigtail catheter was pulled down just above the aortic bifurcation and oblique view of the pelvis was performed to confirm the above findings.  At this point bilateral lower extremity runoff views were obtained through the pigtail catheter.  In the right lower extremity the right common femoral profunda femoris and superficial femoral artery is widely patent.  The right popliteal is patent.  There is one vessel runoff via the peroneal artery.  In the left lower extremity the left common femoral and superficial femoral artery is patent.  The left  popliteal artery was poorly opacified and some haziness suggesting calcification.  The tibial peroneal trunk is patent.  The peroneal artery is patent proximally but occludes in the distal leg.  There is one vessel runoff via the posterior tibial artery however there is very poor filling of digital vessels in the left foot.  At this point to get better opacification of the left lower extremity the pigtail catheter was removed over guidewire and exchanged for a 5 [place] crossover catheter.  This was used to selectively catheterize the left common iliac artery.  An 035 angled Glidewire was then advanced down into the left superficial femoral artery and across her catheter removed and exchanged for a 5 French straight catheter.  Additional left lower extremity runoff views were then obtained.  The left superficial femoral artery is widely patent.  The left popliteal artery is widely patent.  The posterior tibial artery is the runoff vessel to the left foot the peroneal artery occludes in the distal leg the anterior tibial artery is occluded.  There are scattered collaterals coming off the posterior tibial artery and one less than 1 mm segment of subtotal occlusion at the level of the ankle.  However, no obvious flow-limiting lesion that would be amenable to a percutaneous procedure.  At this point the straight catheter was removed over guidewire and a 5 French sheath left in place to be pulled in the holding area.  The patient tired procedure well and there were no complications.  The patient was taken in the holding area in stable condition.  Operative management: The patient has in-line flow to his left lower extremity.  He has unreconstructable pedal disease.  Would continue medical management  with IV antibiotics and local wound care.  No vascular intervention possible for the left lower extremity.  Patent previous intervention on the right lower extremity is intact.  Patient can resume his heparin and 8 hours  after his sheath pull.  Ruta Hinds, MD [facility] [place] Office: [phone] Pager: [phone]

## 2017-11-15 NOTE — Progress Notes (Signed)
PROGRESS NOTE    Eric [name].  JOI:[id number] DOB: 01/02/1928 DOA: 11/10/2017 PCP: [name], MD   Brief Narrative:  HPI on 11/10/2017 by [name] Eric [name]. is a 84 y.o. male with medical history significant of HTNl HLD; PVD s/p RLE angioplasty; afib on AC; OSA; DM; stage 3 CKD; and CAD s/p PCI presenting with left foot cellulitis.  When asked why he came to the hospital, he said, "The doctor said I needed to".  He has a sore on his foot and "it's real red, raised, and they're afraid it's gonna go in deep if he don't get something done about it".  It's been there "a while".  No fevers.  He feels well otherwise.  He previously had an infected blister on his right foot for which he was followed by podiatry.  He was seen yesterday by [name] for this issue and started on Bactrim and Augmentin.  He was seen again today by [name] and sent to the ER due to progressive infection.  Interim history Admitted for left lower extremity cellulitis and wound infection, placed on IV antibiotics. S/p angiography. Assessment & Plan   Left lower extremity cellulitis and wound infection/PVD -Patient was placed on oral antibiotics, Augmentin and Bactrim prior to admission.  He states that his wound has been there for several weeks.  Patient was seeing a foot doctor for his right foot previously. -Left foot x-ray showed no acute osseous abnormalities -Obtained MRI without contrast of the left foot: Showing no septic arthritis or osteomyelitis.  Cellulitis without discrete drainable soft tissue abscess, mild fasciitis or pyomyositis -Was placed on vancomycin, Flagyl, ceftriaxone. Will discontinue vancomycin today. -CRP 7.1, ESR 78 -blood cultures show no growth to date -ABI R 0.91, L 0.89 (with monophasic waves??) -Cellulitis does appear to be improving -Discussed with [name], patient could still have osteo, but recommended vascular follow up -Vascular surgery consulted and  appreciated; patient sees with vasc in [place], [place]-[name] and has intervention on RLE -Vascular surgery recommended angiography-has unreconstructable pedal disease, recommend continued medical management with IV antibiotics and local wound care.  No vascular intervention possible for the left lower extremity.  Diabetes mellitus, type II -Currently diet controlled, last hemoglobin A1c was 6 on 09/07/2017  Atrial fibrillation -Eliquis currently held, placed on heparin in case of possible surgical intervention -Continue metoprolol   Essential hypertension -Continue metoprolol, amlodipine -Lisinopril and HCTZ currently held  Chronic kidney disease, stage III -Creatinine currently within stage III- creatinine 1.14 -Continue to monitor BMP  Leukocytosis -currently on antibiotics -patient afebrile, with no complaints of urinary issues or upper respiratory issues -blood cultures shows no growth to date -Continue to monitor CBC  DVT Prophylaxis  heparin  Code Status: Full  Family Communication: None at bedside.  Disposition Plan: Admitted. Continue IV antibiotics, will discuss angiography results with [name].   Consultants Orthopedic surgery Vascular surgery  Procedures  ABI Abdominal aortogram with bilateral lower extremity runoff third order catheterization left superficial femoral artery  Antibiotics   Anti-infectives (From admission, onward)   Start     Dose/Rate Route Frequency Ordered Stop   11/11/17 1300  vancomycin (VANCOCIN) IVPB 750 mg/150 ml premix  Status:  Discontinued     750 mg 150 mL/hr over 60 Minutes Intravenous Every 24 hours 11/10/17 1229 11/13/17 1134   11/10/17 1830  [MAR Hold]  cefTRIAXone (ROCEPHIN) 2 g in sodium chloride 0.9 % 100 mL IVPB     (  MAR Hold since Mon 11/15/2017 at 0733. Reason: Transfer to a Procedural area.)   2 g 200 mL/hr over 30 Minutes Intravenous Every 24 hours 11/10/17 1737     11/10/17 1830  [MAR Hold]  metroNIDAZOLE (FLAGYL)  IVPB 500 mg     (MAR Hold since Mon 11/15/2017 at 0733. Reason: Transfer to a Procedural area.)   500 mg 100 mL/hr over 60 Minutes Intravenous Every 8 hours 11/10/17 1737     11/10/17 1300  vancomycin (VANCOCIN) 1,250 mg in sodium chloride 0.9 % 250 mL IVPB     1,250 mg 166.7 mL/hr over 90 Minutes Intravenous  Once 11/10/17 1229 11/10/17 1527   11/10/17 1215  piperacillin-tazobactam (ZOSYN) IVPB 3.375 g     3.375 g 100 mL/hr over 30 Minutes Intravenous  Once 11/10/17 1211 11/10/17 1356      Subjective:   Eric [name] seen and examined today.  Feels that pain has improved and redness is improved.  Denies any current chest pain, shortness breath, abdominal pain, nausea or vomiting, diarrhea or constipation.   Objective:   Vitals:   11/15/17 1300 11/15/17 1305 11/15/17 1310 11/15/17 1315  BP: (!) 198/94   (!) 155/93  Pulse: 93     Resp: _0 (!) 24  Temp:      TempSrc:      SpO2: 99% 98% 98% 99%  Weight:      Height:        Intake/Output Summary (Last 24 hours) at 11/15/2017 1507 Last data filed at 11/15/2017 0648 Gross per 24 hour  Intake 360 ml  Output 0 ml  Net 360 ml   Filed Weights   11/10/17 1733 11/10/17 2122 11/13/17 2044  Weight: 79 kg (174 lb 2.6 oz) 78.8 kg (173 lb 11.6 oz) 78.9 kg (174 lb)   Exam  General: Well developed, well nourished, NAD, appears stated age  24: NCAT, mucous membranes moist.   Neck: Supple  Cardiovascular: S1 S2 auscultated, IRR, SEM  Respiratory: Clear to auscultation bilaterally with equal chest rise  Abdomen: Soft, nontender, nondistended, + bowel sounds  Extremities: warm dry without cyanosis clubbing  Neuro: AAOx3, nonfocal  Psych: Pleasant, appropriate mood and affect  Data Reviewed: I have personally reviewed following labs and imaging studies  CBC: Recent Labs  Lab 11/10/17 1036 11/11/17 0512 11/12/17 0753 11/13/17 0423 11/14/17 0359 11/15/17 0342  WBC 11.2* 10.2 10.8* 12.3* 9.9 12.7*  NEUTROABS 7.3  --    --   --   --   --   HGB 11.2* 10.6* 11.2* 11.3* 10.5* 11.6*  HCT 34.2* 31.8* 34.5* 34.4* 31.8* 36.2*  MCV 93.2 93.0 94.0 94.0 93.0 93.5  PLT 221 206 249 294 247 888   Basic Metabolic Panel: Recent Labs  Lab 11/10/17 1036 11/11/17 0512 11/12/17 0753 11/13/17 0423 11/15/17 0342  NA 130* 134* 135 136 136  K 5.7* 4.9 4.1 4.5 3.9  CL 98* 106 107 105 106  CO2 23 19* 19* 20* 19*  GLUCOSE 87 86 113* 95 95  BUN 32* 27* 26* 28* 23*  CREATININE 1.79* 1.71* 1.51* 1.27* 1.14  CALCIUM 9.4 8.7* 9.2 9.6 9.6   GFR: Estimated Creatinine Clearance: 44.4 mL/min (by C-G formula based on SCr of 1.14 mg/dL). Liver Function Tests: Recent Labs  Lab 11/10/17 1036  AST 38  ALT 19  ALKPHOS 38  BILITOT 0.8  PROT 6.9  ALBUMIN 3.3*   No results for input(s): LIPASE, AMYLASE in the last 168 hours. No results  for input(s): AMMONIA in the last 168 hours. Coagulation Profile: Recent Labs  Lab 11/15/17 0342  INR 1.27   Cardiac Enzymes: No results for input(s): CKTOTAL, CKMB, CKMBINDEX, TROPONINI in the last 168 hours. BNP (last 3 results) No results for input(s): PROBNP in the last 8760 hours. HbA1C: No results for input(s): HGBA1C in the last 72 hours. CBG: Recent Labs  Lab 11/14/17 1127 11/14/17 1636 11/14/17 2056 11/15/17 0708 11/15/17 0905  GLUCAP 126* 107* 158* 103* 86   Lipid Profile: No results for input(s): CHOL, HDL, LDLCALC, TRIG, CHOLHDL, LDLDIRECT in the last 72 hours. Thyroid Function Tests: No results for input(s): TSH, T4TOTAL, FREET4, T3FREE, THYROIDAB in the last 72 hours. Anemia Panel: No results for input(s): VITAMINB12, FOLATE, FERRITIN, TIBC, IRON, RETICCTPCT in the last 72 hours. Urine analysis:    Component Value Date/Time   COLORURINE YELLOW 11/10/2017 [place] 11/10/2017 1545   LABSPEC 1.009 11/10/2017 1545   PHURINE 6.0 11/10/2017 1545   GLUCOSEU NEGATIVE 11/10/2017 1545   HGBUR NEGATIVE 11/10/2017 1545   HGBUR trace-lysed 02/07/2008  1101   BILIRUBINUR NEGATIVE 11/10/2017 1545   KETONESUR NEGATIVE 11/10/2017 1545   PROTEINUR NEGATIVE 11/10/2017 1545   UROBILINOGEN 0.2 02/07/2008 1101   NITRITE NEGATIVE 11/10/2017 1545   LEUKOCYTESUR NEGATIVE 11/10/2017 1545   Sepsis Labs: _0 (procalcitonin:4,lacticidven:4)  ) Recent Results (from the past 240 hour(s))  Blood culture (routine x 2)     Status: None   Collection Time: 11/10/17  1:08 PM  Result Value Ref Range Status   Specimen Description BLOOD RIGHT ANTECUBITAL  Final   Special Requests   Final    BOTTLES DRAWN AEROBIC AND ANAEROBIC Blood Culture adequate volume   Culture   Final    NO GROWTH 5 DAYS Performed at [facility], [place] [address] [place], [place] [address]    Report Status 11/15/2017 FINAL  Final  Blood culture (routine x 2)     Status: None   Collection Time: 11/10/17  1:08 PM  Result Value Ref Range Status   Specimen Description BLOOD BLOOD RIGHT WRIST  Final   Special Requests   Final    BOTTLES DRAWN AEROBIC ONLY Blood Culture adequate volume   Culture   Final    NO GROWTH 5 DAYS Performed at [facility], [place] [address], [place], [place] [address]    Report Status 11/15/2017 FINAL  Final  Surgical pcr screen     Status: None   Collection Time: 11/14/17 10:17 PM  Result Value Ref Range Status   MRSA, PCR NEGATIVE NEGATIVE Final   Staphylococcus aureus NEGATIVE NEGATIVE Final    Comment: (NOTE) The Xpert SA Assay (FDA approved for NASAL specimens in patients 29 years of age and older), is one component of a comprehensive surveillance program. It is not intended to diagnose infection nor to guide or monitor treatment. Performed at [facility], [place] [address] [place], [place] [address]       Radiology Studies: No results found.   Scheduled Meds: . [MAR Hold] amLODipine  10 mg Oral Daily  . [MAR Hold] docusate sodium  100 mg Oral BID  . [MAR Hold] metoprolol tartrate  50 mg Oral BID  . [MAR Hold]  multivitamin  15 mL Oral Daily  . [MAR Hold] nutrition supplement (JUVEN)  1 packet Oral BID BM  . [MAR Hold] rosuvastatin  40 mg Oral Daily  . [MAR Hold] silver sulfADIAZINE   Topical Daily  . [  MAR Hold] sodium chloride flush  10-40 mL Intracatheter Q12H   Continuous Infusions: . sodium chloride 100 mL/hr at 11/15/17 0635  . [MAR Hold] cefTRIAXone (ROCEPHIN)  IV 2 g (11/14/17 1825)  . heparin    . [MAR Hold] metronidazole Stopped (11/15/17 0430)     LOS: 5 days   Time Spent in minutes   30 minutes  Mistina Coatney D.O. on 11/15/2017 at 3:07 PM  Between 7am to 7pm - Pager - [phone]  After 7pm go to [email or web address] - password [email or web address]  And look for the night coverage person covering for me after hours  [facility] Office  [phone]

## 2017-11-15 NOTE — Interval H&P Note (Signed)
History and Physical Interval Note:  11/15/2017 7:57 AM  Eric [name].  has presented today for surgery, with the diagnosis of claudication  The various methods of treatment have been discussed with the patient and family. After consideration of risks, benefits and other options for treatment, the patient has consented to  Procedure(s): ABDOMINAL AORTOGRAM W/LOWER EXTREMITY (N/A) as a surgical intervention .  The patient's history has been reviewed, patient examined, no change in status, stable for surgery.  I have reviewed the patient's chart and labs.  Questions were answered to the patient's satisfaction.     Ruta Hinds

## 2017-11-15 NOTE — Progress Notes (Addendum)
Site area: RFA Site Prior to Removal:  Level 0 Pressure Applied For:20 min Manual:   yes Patient Status During Pull:  stable Post Pull Site:  Level 0 Post Pull Instructions Given:  yes Post Pull Pulses Present: rt PT by doppler Dressing Applied:  clear Bedrest begins @ 8786 till 1325 Comments: removed by Suella Broad

## 2017-11-15 NOTE — Progress Notes (Signed)
ANTICOAGULATION CONSULT NOTE - Follow-Up Consult  Pharmacy Consult for Heparin (while apixaban held) Indication:  Afib   Patient Measurements: Height: 5' 5.2" (165.6 cm) Weight: 174 lb (78.9 kg) IBW/kg (Calculated) : 61.96 Heparin Dosing Weight: 78 kg  Vital Signs: Temp: 98.6 F (37 C) (04/08 0405) Temp Source: Oral (04/08 0405) BP: 178/59 (04/08 0933) Pulse Rate: 66 (04/08 0933)  Labs: Recent Labs    11/13/17 0423  11/14/17 0339 11/14/17 0359 11/14/17 0813 11/15/17 0342  HGB 11.3*  --   --  10.5*  --  11.6*  HCT 34.4*  --   --  31.8*  --  36.2*  PLT 294  --   --  247  --  315  APTT 64*  --   --   --   --   --   LABPROT  --   --   --   --   --  15.8*  INR  --   --   --   --   --  1.27  HEPARINUNFRC 0.14*   < > 2.04*  --  0.39 0.64  CREATININE 1.27*  --   --   --   --  1.14   < > = values in this interval not displayed.   Assessment: 84 y.o male admitted 11/10/17 with left foot cellulitis.  He takes apixaban prior to admission for h/o Afib.  Apixaban last given on 4/3. Apixaban held for possible need for surgery for L foot infection. He has abdominal aortogram today. Pharmacy consulted to resume heparin drip 8 hrs after sheath removed (out ~9:15a). No bleeding noted, Hgb stable in 10-11s, platelets are normal. Last heparin level was therapeutic on 1400 units/hr.  Goal of Therapy:  Heparin level 0.3-0.7 units/ml Monitor platelets by anticoagulation protocol: Yes   Plan:  Resume heparin drip at 1400 units/hr with no bolus at 17:30 8 hr heparin level Daily heparin level, CBC Monitor signs/symptoms of bleeding F/U resuming apixaban if no procedures planned   Renold Genta, PharmD, BCPS Clinical Pharmacist Clinical phone for 11/15/2017 until 4p is [phone] After 4p, please call Main Rx at [phone] for assistance 11/15/2017 9:57 AM

## 2017-11-16 ENCOUNTER — Encounter (HOSPITAL_COMMUNITY): Payer: Self-pay | Admitting: Vascular Surgery

## 2017-11-16 LAB — HEPARIN LEVEL (UNFRACTIONATED): HEPARIN UNFRACTIONATED: 0.57 [IU]/mL (ref 0.30–0.70)

## 2017-11-16 LAB — CBC
HCT: 34.8 % — ABNORMAL LOW (ref 39.0–52.0)
HEMOGLOBIN: 11.1 g/dL — AB (ref 13.0–17.0)
MCH: 29.8 pg (ref 26.0–34.0)
MCHC: 31.9 g/dL (ref 30.0–36.0)
MCV: 93.5 fL (ref 78.0–100.0)
PLATELETS: 255 10*3/uL (ref 150–400)
RBC: 3.72 MIL/uL — ABNORMAL LOW (ref 4.22–5.81)
RDW: 15.4 % (ref 11.5–15.5)
WBC: 9.8 10*3/uL (ref 4.0–10.5)

## 2017-11-16 LAB — POCT ACTIVATED CLOTTING TIME: Activated Clotting Time: 164 seconds

## 2017-11-16 LAB — BASIC METABOLIC PANEL
Anion gap: 7 (ref 5–15)
BUN: 16 mg/dL (ref 6–20)
CALCIUM: 8.8 mg/dL — AB (ref 8.9–10.3)
CHLORIDE: 108 mmol/L (ref 101–111)
CO2: 21 mmol/L — ABNORMAL LOW (ref 22–32)
CREATININE: 1.1 mg/dL (ref 0.61–1.24)
GFR calc Af Amer: >60 mL/min (ref >60)
GFR, EST NON AFRICAN AMERICAN: 58 mL/min — AB (ref >60)
Glucose, Bld: 107 mg/dL — ABNORMAL HIGH (ref 65–99)
Potassium: 3.6 mmol/L (ref 3.5–5.1)
SODIUM: 136 mmol/L (ref 135–145)

## 2017-11-16 LAB — GLUCOSE, CAPILLARY: GLUCOSE-CAPILLARY: 95 mg/dL (ref 65–99)

## 2017-11-16 MED ORDER — JUVEN PO PACK: 1.0000 | PACK | Freq: Two times a day (BID) | ORAL | 0 refills | Status: DC

## 2017-11-16 MED FILL — Heparin Sodium (Porcine) 2 Unit/ML in Sodium Chloride 0.9%: INTRAMUSCULAR | Qty: 1000 | Status: AC

## 2017-11-16 MED FILL — Lidocaine HCl Local Inj 1%: INTRAMUSCULAR | Qty: 20 | Status: AC

## 2017-11-16 NOTE — Progress Notes (Addendum)
  Progress Note    11/16/2017 7:56 AM 1 Day Post-Op  Subjective:  Patient states tenderness to L foot and leg has greatly improved since admission   Vitals:   11/15/17 2001 11/16/17 0257  BP: (!) 127/46 (!) 143/60  Pulse: 62 69  Resp: 18 13  Temp: 98.6 F (37 C) 98.5 F (36.9 C)  SpO2: 97% 96%   Physical Exam: Cardiac:  Irregular Lungs:  Non labored Incisions:  R groin catheterization site is soft without palpable pseudo or hematoma Extremities:  R AT and peroneal by doppler; L soft PT, brisk ATA but very diminished DP Abdomen:  Soft Neurologic: baseline  CBC    Component Value Date/Time   WBC 9.8 11/16/2017 0254   RBC 3.72 (L) 11/16/2017 0254   HGB 11.1 (L) 11/16/2017 0254   HCT 34.8 (L) 11/16/2017 0254   PLT 255 11/16/2017 0254   MCV 93.5 11/16/2017 0254   MCH 29.8 11/16/2017 0254   MCHC 31.9 11/16/2017 0254   RDW 15.4 11/16/2017 0254   LYMPHSABS 2.7 11/10/2017 1036   MONOABS 1.0 11/10/2017 1036   EOSABS 0.1 11/10/2017 1036   BASOSABS 0.0 11/10/2017 1036    BMET    Component Value Date/Time   NA 136 11/16/2017 0254   NA 139 04/28/2012 0658   K 3.6 11/16/2017 0254   K 4.2 04/28/2012 0658   CL 108 11/16/2017 0254   CL 106 04/28/2012 0658   CO2 21 (L) 11/16/2017 0254   CO2 25 04/28/2012 0658   GLUCOSE 107 (H) 11/16/2017 0254   GLUCOSE 114 (H) 04/28/2012 0658   BUN 16 11/16/2017 0254   BUN 32 (H) 04/28/2012 0658   CREATININE 1.10 11/16/2017 0254   CREATININE 1.32 (H) 08/16/2017 0802   CALCIUM 8.8 (L) 11/16/2017 0254   CALCIUM 8.9 04/28/2012 0658   GFRNONAA 58 (L) 11/16/2017 0254   GFRNONAA 48 (L) 08/16/2017 0802   GFRAA >60 11/16/2017 0254   GFRAA 56 (L) 08/16/2017 0802    INR    Component Value Date/Time   INR 1.27 11/15/2017 0342   INR 1.3 04/28/2012 0658   INR 2.4 10/02/2010 0816     Intake/Output Summary (Last 24 hours) at 11/16/2017 0756 Last data filed at 11/16/2017 0600 Gross per 24 hour  Intake 2167.41 ml  Output -  Net 2167.41 ml       Assessment/Plan:  84 y.o. male is s/p diagnostic BLE runoff 1 Day Post-Op   R groin catheterization site unremarkable Unreconstructable pedal disease, not a revascularization candidate Continue medical management with IV antibiotics Defer wound care to Ortho  DVT prophylaxis:  IV heparin  Dagoberto Ligas, PA-C [facility] [phone] 11/16/2017 7:56 AM  No intervention performed as his disease is mainly out on the foot.  OK to d/c home with PO abx.  Resume Eliquis.  F/u with [facility]  WB

## 2017-11-16 NOTE — Progress Notes (Deleted)
HPI: FU atrial fibrillation, coronary artery disease. Patient is status post coronary artery bypassing graft in 2000 with a LIMA to the LAD, saphenous vein graft to the diagonal and saphenous vein graft to the PDA. Cardiac catheterization in 2003 revealed normal left main, occluded LAD after the first diagonal, 80% LAD leading into the diagonal, 50% ramus, 50% obtuse marginal 70% mid PDA and an 80% right coronary artery. The saphenous vein graft to the right coronary and diagonal were occluded. The LIMA to the LAD was patent. LV function was normal. Patient had PCI of the LAD at that time. Patient also with peripheral vascular disease and has had prior angioplasty of his right lower extremity in [place] 2013. He also has permanent atrial fibrillation. Last nuclear study in March of 2015 was not gated. There was a small amount of ischemia in the mid anterior wall.  Admitted with left foot cellulitis April.  Patient was treated with antibiotics.  He had an arteriogram but no vascular intervention was felt possible for left lower extremity.  Patient was asked to follow-up in [place] with Dr.  [name]. Since he was last seen    Current Outpatient Medications  Medication Sig Dispense Refill  . acetaminophen (TYLENOL) 500 MG tablet Take 1,000 mg by mouth every 8 (eight) hours as needed for mild pain.     [name] amLODipine (NORVASC) 10 MG tablet Take 1 tablet (10 mg total) by mouth daily. 90 tablet 3  . amoxicillin-clavulanate (AUGMENTIN) 875-125 MG tablet Take 1 tablet by mouth 2 (two) times daily. 20 tablet 0  . apixaban (ELIQUIS) 2.5 MG TABS tablet Take 1 tablet (2.5 mg total) by mouth 2 (two) times daily. 180 tablet 1  . B Complex Vitamins (B COMPLEX-B12) TABS Take 1 tablet by mouth daily.     . Blood Glucose Monitoring Suppl (ONE TOUCH ULTRA SYSTEM KIT) W/DEVICE KIT 1 kit by Does not apply route once. 1 each 0  . cholecalciferol (VITAMIN D) 1000 UNITS tablet Take 1,000 Units by mouth 2 (two) times a  week. Takes on Mon and Fri    . CINNAMON PO Take by mouth 2 (two) times daily.     . fenofibrate 160 MG tablet Take 1 tablet (160 mg total) by mouth daily. 90 tablet 2  . fish oil-omega-3 fatty acids 1000 MG capsule Take 3 g by mouth daily.     [name] glucose blood test strip Use as instructed 100 each 3  . hydrochlorothiazide (HYDRODIURIL) 25 MG tablet Take 1 tablet (25 mg total) by mouth daily. 90 tablet 3  . lisinopril (PRINIVIL,ZESTRIL) 40 MG tablet Take 1 tablet (40 mg total) by mouth daily. 90 tablet 3  . metoprolol tartrate (LOPRESSOR) 50 MG tablet Take 1 tablet (50 mg total) by mouth 2 (two) times daily. 180 tablet 3  . Multiple Vitamin (DAILY MULTIVITAMIN PO) Take 1 tablet by mouth daily.      . nutrition supplement, JUVEN, (JUVEN) PACK Take 1 packet by mouth 2 (two) times daily between meals. 60 packet 0  . ONETOUCH DELICA LANCETS FINE MISC check sugar once daily 100 each 2  . rosuvastatin (CRESTOR) 40 MG tablet Take 1 tablet (40 mg total) by mouth daily. 90 tablet 2   No current facility-administered medications for this visit.    Facility-Administered Medications Ordered in Other Visits  Medication Dose Route Frequency Provider Last Rate Last Dose  . 0.9 %  sodium chloride infusion   Intravenous Continuous [name], MD  10 mL/hr at 11/16/17 0600    . 0.9 %  sodium chloride infusion  250 mL Intravenous PRN [name], MD      . acetaminophen (TYLENOL) tablet 650 mg  650 mg Oral Q6H PRN [name], MD   650 mg at 11/13/17 2110   Or  . acetaminophen (TYLENOL) suppository 650 mg  650 mg Rectal Q6H PRN [name], MD      . acetaminophen (TYLENOL) tablet 650 mg  650 mg Oral Q4H PRN [name], MD      . amLODipine (NORVASC) tablet 10 mg  10 mg Oral Daily [name], MD   10 mg at 11/16/17 9892  . cefTRIAXone (ROCEPHIN) 2 g in sodium chloride 0.9 % 100 mL IVPB  2 g Intravenous Q24H [name], MD   Stopped at 11/15/17 2030  . docusate sodium  (COLACE) capsule 100 mg  100 mg Oral BID [name], MD   100 mg at 11/16/17 1194  . heparin ADULT infusion 100 units/mL (25000 units/224m sodium chloride 0.45%)  1,400 Units/hr Intravenous Continuous WLyndee Leo RPH 14 mL/hr at 11/16/17 017401,400 Units/hr at 11/16/17 0952  . hydrALAZINE (APRESOLINE) injection 5 mg  5 mg Intravenous Q20 Min PRN FElam Dutch MD   5 mg at 11/15/17 1733  . labetalol (NORMODYNE,TRANDATE) injection 10 mg  10 mg Intravenous Q10 min PRN FElam Dutch MD      . metoprolol tartrate (LOPRESSOR) tablet 50 mg  50 mg Oral BID FElam Dutch MD   50 mg at 11/16/17 08144 . metroNIDAZOLE (FLAGYL) IVPB 500 mg  500 mg Intravenous Q8H FElam Dutch MD 100 mL/hr at 11/16/17 0950 500 mg at 11/16/17 0950  . morphine 2 MG/ML injection 2 mg  2 mg Intravenous Q2H PRN FElam Dutch MD      . multivitamin liquid 15 mL  15 mL Oral Daily FElam Dutch MD   15 mL at 11/16/17 08185 . nutrition supplement (JUVEN) (JUVEN) powder packet 1 packet  1 packet Oral BID BM FElam Dutch MD   1 packet at 11/16/17 0940  . ondansetron (ZOFRAN) injection 4 mg  4 mg Intravenous Q6H PRN FElam Dutch MD      . ondansetron ([facility] tablet 4 mg  4 mg Oral Q6H PRN FElam Dutch MD      . rosuvastatin (CRESTOR) tablet 40 mg  40 mg Oral Daily FElam Dutch MD   40 mg at 11/16/17 06314 . silver sulfADIAZINE (SILVADENE) 1 % cream   Topical Daily Fields, Charles E, MD      . sodium chloride flush (NS) 0.9 % injection 10-40 mL  10-40 mL Intracatheter Q12H FElam Dutch MD   10 mL at 11/16/17 0941  . sodium chloride flush (NS) 0.9 % injection 10-40 mL  10-40 mL Intracatheter PRN FElam Dutch MD   10 mL at 11/13/17 0433  . sodium chloride flush (NS) 0.9 % injection 3 mL  3 mL Intravenous Q12H FElam Dutch MD   3 mL at 11/16/17 0941     Past Medical History:  Diagnosis Date  . Atrial flutter ([place]    s/p RFCA  . CAD (coronary artery  disease)    cath 2003, occluded S-RCA, occluded S-Dx, L-LAD ok, s/p PTCA to LAD  . Cataract   . CKD (chronic kidney disease) stage 3, GFR 30-59 ml/min (HCC)   .  Diabetes mellitus    diet controlled  . Hearing aid worn    bilateral  . Long term (current) use of anticoagulants   . Neuromuscular disorder ([place])   . OSA (obstructive sleep apnea) 12/11   very mild, AHI 7/hr  . Persistent atrial fibrillation ([place]) 09/26/2015  . Pure hyperglyceridemia   . PVD (peripheral vascular disease) ([place])    angioplasty of his right lower extremity in [place] by Dr.[name] 2013  . Unspecified essential hypertension   . Wears dentures    full upper and lower    Past Surgical History:  Procedure Laterality Date  . ABDOMINAL AORTOGRAM W/LOWER EXTREMITY N/A 11/15/2017   Procedure: ABDOMINAL AORTOGRAM W/LOWER EXTREMITY;  Surgeon: [name], MD;  Location: [place] [facility];  Service: Cardiovascular;  Laterality: N/A;  . Adenosine Myoview  3/06   EF 56%, neg. Ischemia  . Adenosine Myoview  02/18/07   nml  . ANGIOPLASTY  1/99   CAD- diogonal with rotational artherectomy  . Arthrectomy     of LAD & PTCA  . BLEPHAROPLASTY Bilateral   . CARDIAC CATHETERIZATION  1/00  . CARDIOVERSION  1/04  . CARDIOVERSION  5/07   hospital- a flutter  . CARPAL TUNNEL RELEASE     ? bilateral  . CATARACT EXTRACTION W/PHACO Right 07/28/2017   Procedure: CATARACT EXTRACTION PHACO AND INTRAOCULAR LENS PLACEMENT ([place]) RIGHT DIABETIC;  Surgeon: [name], MD;  Location: [place];  Service: Ophthalmology;  Laterality: Right;  Diabetic - diet controlled  . CATARACT EXTRACTION W/PHACO Left 08/18/2017   Procedure: CATARACT EXTRACTION PHACO AND INTRAOCULAR LENS PLACEMENT ([facility]);  Surgeon: [name], MD;  Location: [place];  Service: Ophthalmology;  Laterality: Left;  DIABETES - oral meds  . COLONOSCOPY W/ BIOPSIES  10/01/06   sigmoid polyp bx neg, 3 years  . CORONARY ANGIOPLASTY  4/03    cutting balloon PTCA pLAD into Diag  . CORONARY ARTERY BYPASS GRAFT  2000   LIMA-LAD, SVG-RCA, SVG-Diag; SVG-Diag & SVG-RCA occluded 2003  . FRACTURE SURGERY    . HAND SURGERY  08/27/09   R thumb procedure wit Scaphoid Gragt and screws, Dr [name]  . NM MYOVIEW LTD  4/11   normal  . PERIPHERAL VASCULAR CATHETERIZATION Right 06/27/2015   Procedure: Lower Extremity Angiography;  Surgeon: Algernon [name], MD;  Location: [facility] [place] [facility];  Service: Cardiovascular;  Laterality: Right;  . PERIPHERAL VASCULAR CATHETERIZATION  06/27/2015   Procedure: Lower Extremity Intervention;  Surgeon: Algernon [name], MD;  Location: [place] [facility];  Service: Cardiovascular;;    Social History   Socioeconomic History  . Marital status: Married    Spouse name: Not on file  . Number of children: 5  . Years of education: Not on file  . Highest education level: Not on file  Occupational History  . Occupation: [employer]    Employer: RETIRED  Social Needs  . Financial resource strain: Not on file  . Food insecurity:    Worry: Not on file    Inability: Not on file  . Transportation needs:    Medical: Not on file    Non-medical: Not on file  Tobacco Use  . Smoking status: Never Smoker  . Smokeless tobacco: Former Network engineer and Sexual Activity  . Alcohol use: No  . Drug use: No  . Sexual activity: Never  Lifestyle  . Physical activity:    Days per week: Not on file    Minutes per  session: Not on file  . Stress: Not on file  Relationships  . Social connections:    Talks on phone: Not on file    Gets together: Not on file    Attends religious service: Not on file    Active member of club or organization: Not on file    Attends meetings of clubs or organizations: Not on file    Relationship status: Not on file  . Intimate partner violence:    Fear of current or ex partner: Not on file    Emotionally abused: Not on file    Physically abused: Not on file    Forced sexual  activity: Not on file  Other Topics Concern  . Not on file  Social History Narrative   Retired now does some antiques   Retired from Theatre manager and dye work   Married 1951   5 kids    Family History  Problem Relation Age of Onset  . Stroke Father   . Aneurysm Father   . Hypertension Father   . Prostate cancer Brother   . Hypertension Mother   . Lung cancer Brother        smoker  . Thrombosis Brother   . Other Brother        RF valve disorder (smoker)  . Lung cancer Brother        smoker  . Breast cancer Sister   . Liver cancer Brother   . Other Sister        cerebral hemorrhage    ROS: no fevers or chills, productive cough, hemoptysis, dysphasia, odynophagia, melena, hematochezia, dysuria, hematuria, rash, seizure activity, orthopnea, PND, pedal edema, claudication. Remaining systems are negative.  Physical Exam: Well-developed well-nourished in no acute distress.  Skin is warm and dry.  HEENT is normal.  Neck is supple.  Chest is clear to auscultation with normal expansion.  Cardiovascular exam is regular rate and rhythm.  Abdominal exam nontender or distended. No masses palpated. Extremities show no edema. neuro grossly intact  ECG- personally reviewed  A/P  1  Kirk Ruths, MD

## 2017-11-16 NOTE — Discharge Summary (Signed)
Physician Discharge Summary  Eric [name]. HYW:[id number] DOB: 09/24/1927 DOA: 11/10/2017  PCP: Eric Frizzle, MD  Admit date: 11/10/2017 Discharge date: 11/16/2017  Time spent: 45 minutes  Recommendations for Outpatient Follow-up:  Patient will be discharged to home.  Patient will need to follow up with primary care provider within one week of discharge.  Follow up with [name], vascular surgery. Follow up with [name], orthopedics, in one week. Patient should continue medications as prescribed.  Patient should follow a heart healthy/carb modified diet.   Discharge Diagnoses:  Left lower extremity cellulitis and wound infection/PVD Diabetes mellitus, type II Atrial fibrillation Essential hypertension Chronic kidney disease, stage III Leukocytosis  Discharge Condition: Stable  Diet recommendation: heart healthy/carb modified  Filed Weights   11/10/17 2122 11/13/17 2044 11/16/17 0257  Weight: 78.8 kg (173 lb 11.6 oz) 78.9 kg (174 lb) 80.4 kg (177 lb 4 oz)    History of present illness:  on 11/10/2017 by [name] Eric Mckayis a 84 y.o.malewith medical history significant ofHTNl HLD; PVD s/p RLE angioplasty; afib on AC; OSA; DM; stage 3 CKD; and CAD s/p PCI presenting withleftfoot cellulitis. When asked why he came to the hospital, he said, "The doctor said I needed to". He has a sore on his foot and "it's real red, raised, and they're afraid it's gonna go in deep if he don'tget something done about it". It's been there "a while". No fevers. He feels well otherwise.He previously had an infected blister on his right foot for which he was followed by podiatry. He was seen yesterday by [name] for this issue and started on Bactrim and Augmentin. He was seen again today by [name] and sent to the ER due to progressive infection.  Hospital Course:  Left lower extremity cellulitis and wound infection/PVD -Patient was placed on oral antibiotics,  Augmentin and Bactrim prior to admission.  He states that his wound has been there for several weeks.  Patient was seeing a foot doctor for his right foot previously. -Left foot x-ray showed no acute osseous abnormalities -Obtained MRI without contrast of the left foot: Showing no septic arthritis or osteomyelitis.  Cellulitis without discrete drainable soft tissue abscess, mild fasciitis or pyomyositis -Was placed on vancomycin, Flagyl, ceftriaxone.  -CRP 7.1, ESR 78 -blood cultures show no growth to date -ABI R 0.91, L 0.89 (with monophasic waves??) -Cellulitis does appear to be improving -Discussed with [name], patient could still have osteo, but recommended vascular follow up -Vascular surgery consulted and appreciated; patient sees with vasc in [place], [place][name] and has intervention on RLE -Vascular surgery recommended angiography-has unreconstructable pedal disease, recommend continued medical management with IV antibiotics and local wound care.  No vascular intervention possible for the left lower extremity. -Discussed with [name] and [name]. Will place on oral antibiotics and patient can follow up with [name] (vasc surg) and [name] upon discharge. -Will discharge on Augmentin for additional 9 days  Diabetes mellitus, type II -Currently diet controlled, last hemoglobin A1c was 6 on 09/07/2017  Atrial fibrillation -Continue Eliquis -Continue metoprolol   Essential hypertension -Continue metoprolol, amlodipine -Lisinopril and HCTZ currently held- may resume on discharge  Chronic kidney disease, stage III -Creatinine currently within stage III- creatinine 1.10 -Continue to monitor BMP  Leukocytosis -currently on antibiotics -Resolved -patient afebrile, with no complaints of urinary issues or upper respiratory issues -blood cultures shows no growth to date  Consultants Orthopedic surgery Vascular surgery  Procedures  ABI Abdominal aortogram with  bilateral lower extremity runoff third order catheterization left superficial femoral artery  Discharge Exam: Vitals:   11/16/17 0257 11/16/17 0822  BP: (!) 143/60 (!) 161/53  Pulse: 69 69  Resp: 13 20  Temp: 98.5 F (36.9 C) 98.3 F (36.8 C)  SpO2: 96% 97%   Chest pain, shortness breath, abdominal pain, nausea vomiting, diarrhea constipation.  Feels his redness and pain have gotten better in his foot.   General: Well developed, well nourished, NAD, appears stated age  HEENT: NCAT, mucous membranes moist.  Cardiovascular: S1 S2 auscultated, irregular, +SEM  Respiratory: Clear to auscultation bilaterally with equal chest rise  Abdomen: Soft, nontender, nondistended, + bowel sounds  Extremities: warm dry without cyanosis clubbing or edema. Left great toe with eschar, erythema improving.  Neuro: AAOx3, nonfocal  Psych: Normal affect and demeanor with intact judgement and insight  Discharge Instructions Discharge Instructions    Discharge instructions   Complete by:  As directed    Patient will be discharged to home.  Patient will need to follow up with primary care provider within one week of discharge.  Follow up with [name], vascular surgery. Follow up with [name], orthopedics, in one week. Patient should continue medications as prescribed.  Patient should follow a heart healthy/carb modified diet.     Allergies as of 11/16/2017      Reactions   Isosorbide Mononitrate Other (See Comments)   Unknown, doesn't remember    Ramipril Cough   Quinidine Gluconate Rash      Medication List    STOP taking these medications   sulfamethoxazole-trimethoprim 800-160 MG tablet Commonly known as:  BACTRIM DS,SEPTRA DS     TAKE these medications   acetaminophen 500 MG tablet Commonly known as:  TYLENOL Take 1,000 mg by mouth every 8 (eight) hours as needed for mild pain.   amLODipine 10 MG tablet Commonly known as:  NORVASC Take 1 tablet (10 mg total) by mouth daily.     amoxicillin-clavulanate 875-125 MG tablet Commonly known as:  AUGMENTIN Take 1 tablet by mouth 2 (two) times daily.   apixaban 2.5 MG Tabs tablet Commonly known as:  ELIQUIS Take 1 tablet (2.5 mg total) by mouth 2 (two) times daily.   B Complex-B12 Tabs Take 1 tablet by mouth daily.   cholecalciferol 1000 units tablet Commonly known as:  VITAMIN D Take 1,000 Units by mouth 2 (two) times a week. Takes on Mon and Fri   CINNAMON PO Take by mouth 2 (two) times daily.   DAILY MULTIVITAMIN PO Take 1 tablet by mouth daily.   fenofibrate 160 MG tablet Take 1 tablet (160 mg total) by mouth daily.   fish oil-omega-3 fatty acids 1000 MG capsule Take 3 g by mouth daily.   glucose blood test strip Use as instructed   hydrochlorothiazide 25 MG tablet Commonly known as:  HYDRODIURIL Take 1 tablet (25 mg total) by mouth daily.   lisinopril 40 MG tablet Commonly known as:  PRINIVIL,ZESTRIL Take 1 tablet (40 mg total) by mouth daily.   metoprolol tartrate 50 MG tablet Commonly known as:  LOPRESSOR Take 1 tablet (50 mg total) by mouth 2 (two) times daily.   nutrition supplement (JUVEN) Pack Take 1 packet by mouth 2 (two) times daily between meals.   ONE TOUCH ULTRA SYSTEM KIT w/Device Kit 1 kit by Does not apply route once.   ONETOUCH DELICA LANCETS FINE Misc check sugar once daily   rosuvastatin 40  MG tablet Commonly known as:  CRESTOR Take 1 tablet (40 mg total) by mouth daily.      Allergies  Allergen Reactions  . Isosorbide Mononitrate Other (See Comments)    Unknown, doesn't remember   . Ramipril Cough  . Quinidine Gluconate Rash   Follow-up Information    Eric Frizzle, MD Follow up.   Specialty:  Family Medicine Contact information: [address] [place] [address] [phone]        Algernon [name], MD. Schedule an appointment as soon as possible for a visit in 1 week(s).   Specialties:  Vascular Surgery, Radiology, Interventional  Cardiology Why:  Hospital follow up Contact information: [address] [place] [address] [phone]        Newt Minion, MD. Schedule an appointment as soon as possible for a visit in 1 week(s).   Specialty:  Orthopedic Surgery Why:  Hospital follow up Contact information: [place] [address] [phone]            The results of significant diagnostics from this hospitalization (including imaging, microbiology, ancillary and laboratory) are listed below for reference.    Significant Diagnostic Studies: Mr Foot Left Wo Contrast  Result Date: 11/11/2017 CLINICAL DATA:  Pain and swelling.  Great toe ulcer. EXAM: MRI OF THE LEFT FOOT WITHOUT CONTRAST TECHNIQUE: Multiplanar, multisequence MR imaging of the left foot was performed. No intravenous contrast was administered. COMPARISON:  Left foot radiographs 11/10/2017 FINDINGS: Examination is quite limited by patient motion. Subcutaneous soft tissue swelling/edema suggesting cellulitis. No findings for myofasciitis or pyomyositis. No discrete drainable fluid collection is identified. No definite findings for septic arthritis or osteomyelitis. IMPRESSION: Very limited examination but no definite MR findings to suggest septic arthritis or osteomyelitis. Cellulitis without discrete drainable soft tissue abscess, myofasciitis or pyomyositis. Electronically Signed   By: Marijo Sanes M.D.   On: 11/11/2017 11:06   Dg Foot Complete Left  Result Date: 11/10/2017 CLINICAL DATA:  Wound at bottom of LEFT foot with redness up to mid calf, history of cellulitis, diabetes mellitus EXAM: LEFT FOOT - COMPLETE 3+ VIEW COMPARISON:  None FINDINGS: Osseous demineralization diffusely. Scattered narrowing of interphalangeal joints. Remaining joint spaces preserved. Small vessel vascular calcifications consistent with history of diabetes mellitus. No acute fracture, dislocation, or bone destruction. Scattered soft tissue  swelling including dorsum of foot. No definite soft tissue gas or radiopaque foreign bodies. IMPRESSION: No acute osseous abnormalities. Electronically Signed   By: Lavonia Dana M.D.   On: 11/10/2017 11:00    Microbiology: Recent Results (from the past 240 hour(s))  Blood culture (routine x 2)     Status: None   Collection Time: 11/10/17  1:08 PM  Result Value Ref Range Status   Specimen Description BLOOD RIGHT ANTECUBITAL  Final   Special Requests   Final    BOTTLES DRAWN AEROBIC AND ANAEROBIC Blood Culture adequate volume   Culture   Final    NO GROWTH 5 DAYS Performed at [facility], [address], [place], [place] [address]    Report Status 11/15/2017 FINAL  Final  Blood culture (routine x 2)     Status: None   Collection Time: 11/10/17  1:08 PM  Result Value Ref Range Status   Specimen Description BLOOD BLOOD RIGHT WRIST  Final   Special Requests   Final    BOTTLES DRAWN AEROBIC ONLY Blood Culture adequate volume   Culture   Final  NO GROWTH 5 DAYS Performed at [facility], [place] [address] [place], [place] [address]    Report Status 11/15/2017 FINAL  Final  Surgical pcr screen     Status: None   Collection Time: 11/14/17 10:17 PM  Result Value Ref Range Status   MRSA, PCR NEGATIVE NEGATIVE Final   Staphylococcus aureus NEGATIVE NEGATIVE Final    Comment: (NOTE) The Xpert SA Assay (FDA approved for NASAL specimens in patients 24 years of age and older), is one component of a comprehensive surveillance program. It is not intended to diagnose infection nor to guide or monitor treatment. Performed at [facility], [place] [address], [place], [place] [address]      Labs: Basic Metabolic Panel: Recent Labs  Lab 11/11/17 [id number] 11/12/17 0753 11/13/17 0423 11/15/17 0342 11/16/17 0254  NA 134* 135 136 136 136  K 4.9 4.1 4.5 3.9 3.6  CL 106 107 105 106 108  CO2 19* 19* 20* 19* 21*  GLUCOSE 86 113* 95 95 107*  BUN 27* 26* 28* 23* 16  CREATININE  1.71* 1.51* 1.27* 1.14 1.10  CALCIUM 8.7* 9.2 9.6 9.6 8.8*   Liver Function Tests: Recent Labs  Lab 11/10/17 1036  AST 38  ALT 19  ALKPHOS 38  BILITOT 0.8  PROT 6.9  ALBUMIN 3.3*   No results for input(s): LIPASE, AMYLASE in the last 168 hours. No results for input(s): AMMONIA in the last 168 hours. CBC: Recent Labs  Lab 11/10/17 1036  11/12/17 0753 11/13/17 0423 11/14/17 0359 11/15/17 0342 11/16/17 0254  WBC 11.2*   < > 10.8* 12.3* 9.9 12.7* 9.8  NEUTROABS 7.3  --   --   --   --   --   --   HGB 11.2*   < > 11.2* 11.3* 10.5* 11.6* 11.1*  HCT 34.2*   < > 34.5* 34.4* 31.8* 36.2* 34.8*  MCV 93.2   < > 94.0 94.0 93.0 93.5 93.5  PLT 221   < > 249 294 247 315 255   < > = values in this interval not displayed.   Cardiac Enzymes: No results for input(s): CKTOTAL, CKMB, CKMBINDEX, TROPONINI in the last 168 hours. BNP: BNP (last 3 results) No results for input(s): BNP in the last 8760 hours.  ProBNP (last 3 results) No results for input(s): PROBNP in the last 8760 hours.  CBG: Recent Labs  Lab 11/15/17 0708 11/15/17 0905 11/15/17 1553 11/15/17 2101 11/16/17 0701  GLUCAP 103* 86 125* 178* 95       Signed:  Delynn Olvera  [facility] 11/16/2017, 10:44 AM

## 2017-11-16 NOTE — Progress Notes (Signed)
ANTICOAGULATION CONSULT NOTE - [place] for Heparin (while apixaban held) Indication:  Afib   Patient Measurements: Height: 5' 5.2" (165.6 cm) Weight: 174 lb (78.9 kg) IBW/kg (Calculated) : 61.96 Heparin Dosing Weight: 78 kg  Vital Signs: Temp: 98.6 F (37 C) (04/08 2001) Temp Source: Oral (04/08 2001) BP: 127/46 (04/08 2001) Pulse Rate: 62 (04/08 2001)  Labs: Recent Labs    11/13/17 0423  11/14/17 0359 11/14/17 0813 11/15/17 0342 11/16/17 0254  HGB 11.3*  --  10.5*  --  11.6* 11.1*  HCT 34.4*  --  31.8*  --  36.2* 34.8*  PLT 294  --  247  --  315 255  APTT 64*  --   --   --   --   --   LABPROT  --   --   --   --  15.8*  --   INR  --   --   --   --  1.27  --   HEPARINUNFRC 0.14*   < >  --  0.39 0.64 0.57  CREATININE 1.27*  --   --   --  1.14  --    < > = values in this interval not displayed.    Assessment: 84 yo male admitted 11/10/17 with left foot cellulitis.  He takes apixaban prior to admission for h/o Afib.  Apixaban last given on 4/3. Apixaban held for possible need for surgery for L foot infection. He had an abdominal aortogram yesterday.  Heparin drip was resumed 8 hrs after sheath removed. Heparin level is therapeutic.    Goal of Therapy:  Heparin level 0.3-0.7 units/ml Monitor platelets by anticoagulation protocol: Yes    Plan:  Continue heparin at 1400 units/hr Confirmatory level later today Daily heparin level, CBC    Harvel Quale 11/16/2017 3:23 AM

## 2017-11-16 NOTE — Consult Note (Signed)
[facility] CM Primary Care Navigator  11/16/2017  Eric [name]. 1928-05-16 [id number]  Met with patient and wife Eric [name]) at the bedsideto identify possible discharge needs. Patientreports having "increased pain, redness, drainage, swelling and warm to touch on left foot"that had led to this admission (left lower extremity cellulitis and wound infection/peripheral vascular disease).  PatientendorsesDr.Warren [name] with [place] primary care provider.   [facility] [address] [place] [facility] [facility].   Patientstatesthat he has been managinghismedications at [employer] wife's assistance using "pill box" system filled weekly.  Patient reports that he has been driving prior to admission but his wife will be able toprovidetransportation to hisdoctors'appointments if needed after discharge.  Patientliveswith wife at home whowill serve ashisprimary caregiver whendischarged.   Anticipated discharge plan ishomeperpatient.  Patient and wife voiced understanding to call primary care provider's office whenhereturns home, for a post discharge follow-up appointment within1- 2 weeksor sooner if needed.Patient letter (with PCP's contact number) was provided asareminder.  Explained topatientregardingTHN CM services available for health managementat homebut hedeniesanycurrent needs or concernsat thispoint and he statesbeing able to manage himselfso far with wife's assistance.  Patientand wife expressedunderstandingto seek referral from primary care provider to [facility] care management ifnecessaryand deemed appropriate for anyservices in the future.  [name] care management information provided for future needs thathemay have.  Patienthowever,verbally agreedand opted forEMMIcalls tofollowup his recoveryat home.  Referral made for [facility]  General calls after discharge.   For additional questions please contact:  Eric [name], BSN, RN-BC [facility] PRIMARY CARE Navigator Cell: [phone]

## 2017-11-16 NOTE — Discharge Instructions (Signed)

## 2017-11-16 NOTE — Care Management Important Message (Signed)
Important Message  Patient Details  Name: Eric [name]. MRN: [id number] Date of Birth: 21-Apr-1928   Medicare Important Message Given:  Yes    Barb Merino Monisha Siebel 11/16/2017, 12:48 PM

## 2017-11-18 ENCOUNTER — Encounter: Payer: Self-pay | Admitting: Family Medicine

## 2017-11-19 ENCOUNTER — Other Ambulatory Visit: Payer: Self-pay | Admitting: Family Medicine

## 2017-11-19 NOTE — Telephone Encounter (Signed)
Pharm sent refill request for Amox - pt has a hosp f/u on 11/25/17 - OK to refill

## 2017-11-21 ENCOUNTER — Encounter: Payer: Self-pay | Admitting: Cardiology

## 2017-11-23 ENCOUNTER — Ambulatory Visit (INDEPENDENT_AMBULATORY_CARE_PROVIDER_SITE_OTHER): Payer: PPO

## 2017-11-23 ENCOUNTER — Encounter (INDEPENDENT_AMBULATORY_CARE_PROVIDER_SITE_OTHER): Payer: Self-pay | Admitting: Vascular Surgery

## 2017-11-23 ENCOUNTER — Ambulatory Visit (INDEPENDENT_AMBULATORY_CARE_PROVIDER_SITE_OTHER): Payer: PPO | Admitting: Vascular Surgery

## 2017-11-23 VITALS — BP 135/71 | HR 60 | Resp 16 | Ht 65.0 in | Wt 179.0 lb

## 2017-11-23 DIAGNOSIS — I7025 Atherosclerosis of native arteries of other extremities with ulceration: Secondary | ICD-10-CM | POA: Insufficient documentation

## 2017-11-23 DIAGNOSIS — I10 Essential (primary) hypertension: Secondary | ICD-10-CM | POA: Diagnosis not present

## 2017-11-23 DIAGNOSIS — I739 Peripheral vascular disease, unspecified: Secondary | ICD-10-CM

## 2017-11-23 DIAGNOSIS — E1151 Type 2 diabetes mellitus with diabetic peripheral angiopathy without gangrene: Secondary | ICD-10-CM

## 2017-11-23 DIAGNOSIS — I7090 Unspecified atherosclerosis: Secondary | ICD-10-CM | POA: Insufficient documentation

## 2017-11-23 NOTE — Patient Instructions (Signed)

## 2017-11-23 NOTE — Progress Notes (Signed)
MRN : [id number]  Dimitri Shakespeare. is a 84 y.o. (03-Sep-1927) male who presents with chief complaint of  Chief Complaint  Patient presents with  . Follow-up  .  History of Present Illness: Patient returns today in follow up of peripheral arterial disease.  He has developed a left great toe ulceration and infection.  He has been treated with antibiotics and local wound care.  They are putting Silvadene on the toe.  He has what appears to be a dark eschar on the medial and inferior aspect of the toe with mild to moderate surrounding erythema.  There is some mild drainage.  No foul odor is present.  He was recently admitted to [facility] where an angiogram was performed showing only small vessel disease in the foot.  His ABIs today were normal bilaterally but his digital pressure was only 30 on the left and 70 on the right.  He has no fevers or chills.  This is intermittently painful.  Current Outpatient Medications  Medication Sig Dispense Refill  . acetaminophen (TYLENOL) 500 MG tablet Take 1,000 mg by mouth every 8 (eight) hours as needed for mild pain.     [name] amLODipine (NORVASC) 10 MG tablet Take 1 tablet (10 mg total) by mouth daily. 90 tablet 3  . amoxicillin-clavulanate (AUGMENTIN) 875-125 MG tablet TAKE 1 TABLET BY MOUTH TWICE DAILY 20 tablet 0  . apixaban (ELIQUIS) 2.5 MG TABS tablet Take 1 tablet (2.5 mg total) by mouth 2 (two) times daily. 180 tablet 1  . B Complex Vitamins (B COMPLEX-B12) TABS Take 1 tablet by mouth daily.     . Blood Glucose Monitoring Suppl (ONE TOUCH ULTRA SYSTEM KIT) W/DEVICE KIT 1 kit by Does not apply route once. 1 each 0  . cholecalciferol (VITAMIN D) 1000 UNITS tablet Take 1,000 Units by mouth 2 (two) times a week. Takes on Mon and Fri    . CINNAMON PO Take by mouth 2 (two) times daily.     . fenofibrate 160 MG tablet Take 1 tablet (160 mg total) by mouth daily. 90 tablet 2  . fish oil-omega-3 fatty acids 1000 MG capsule Take 3 g by mouth daily.      [name] glucose blood test strip Use as instructed 100 each 3  . hydrochlorothiazide (HYDRODIURIL) 25 MG tablet Take 1 tablet (25 mg total) by mouth daily. 90 tablet 3  . lisinopril (PRINIVIL,ZESTRIL) 40 MG tablet Take 1 tablet (40 mg total) by mouth daily. 90 tablet 3  . metoprolol tartrate (LOPRESSOR) 50 MG tablet Take 1 tablet (50 mg total) by mouth 2 (two) times daily. 180 tablet 3  . Multiple Vitamin (DAILY MULTIVITAMIN PO) Take 1 tablet by mouth daily.      . nutrition supplement, JUVEN, (JUVEN) PACK Take 1 packet by mouth 2 (two) times daily between meals. 60 packet 0  . ONETOUCH DELICA LANCETS FINE MISC check sugar once daily 100 each 2  . rosuvastatin (CRESTOR) 40 MG tablet Take 1 tablet (40 mg total) by mouth daily. 90 tablet 2   No current facility-administered medications for this visit.     Past Medical History:  Diagnosis Date  . Atrial flutter ([place])    s/p RFCA  . CAD (coronary artery disease)    cath 2003, occluded S-RCA, occluded S-Dx, L-LAD ok, s/p PTCA to LAD  . Cataract   . CKD (chronic kidney disease) stage 3, GFR 30-59 ml/min (HCC)   . Diabetes mellitus    diet  controlled  . Hearing aid worn    bilateral  . Long term (current) use of anticoagulants   . Neuromuscular disorder ([place])   . OSA (obstructive sleep apnea) 12/11   very mild, AHI 7/hr  . Persistent atrial fibrillation ([place]) 09/26/2015  . Pure hyperglyceridemia   . PVD (peripheral vascular disease) ([place])    angioplasty of his right lower extremity in [place] by Dr.[name] 2013  . Unspecified essential hypertension   . Wears dentures    full upper and lower    Past Surgical History:  Procedure Laterality Date  . ABDOMINAL AORTOGRAM W/LOWER EXTREMITY N/A 11/15/2017   Procedure: ABDOMINAL AORTOGRAM W/LOWER EXTREMITY;  Surgeon: [name], MD;  Location: [facility] [facility];  Service: Cardiovascular;  Laterality: N/A;  . Adenosine Myoview  3/06   EF 56%, neg. Ischemia  . Adenosine Myoview  02/18/07    nml  . ANGIOPLASTY  1/99   CAD- diogonal with rotational artherectomy  . Arthrectomy     of LAD & PTCA  . BLEPHAROPLASTY Bilateral   . CARDIAC CATHETERIZATION  1/00  . CARDIOVERSION  1/04  . CARDIOVERSION  5/07   hospital- a flutter  . CARPAL TUNNEL RELEASE     ? bilateral  . CATARACT EXTRACTION W/PHACO Right 07/28/2017   Procedure: CATARACT EXTRACTION PHACO AND INTRAOCULAR LENS PLACEMENT ([place]) RIGHT DIABETIC;  Surgeon: [name], MD;  Location: [facility];  Service: Ophthalmology;  Laterality: Right;  Diabetic - diet controlled  . CATARACT EXTRACTION W/PHACO Left 08/18/2017   Procedure: CATARACT EXTRACTION PHACO AND INTRAOCULAR LENS PLACEMENT (I[facility]);  Surgeon: [name], MD;  Location: [facility];  Service: Ophthalmology;  Laterality: Left;  DIABETES - oral meds  . COLONOSCOPY W/ BIOPSIES  10/01/06   sigmoid polyp bx neg, 3 years  . CORONARY ANGIOPLASTY  4/03   cutting balloon PTCA pLAD into Diag  . CORONARY ARTERY BYPASS GRAFT  2000   LIMA-LAD, SVG-RCA, SVG-Diag; SVG-Diag & SVG-RCA occluded 2003  . FRACTURE SURGERY    . HAND SURGERY  08/27/09   R thumb procedure wit Scaphoid Gragt and screws, Dr [name]  . NM MYOVIEW LTD  4/11   normal  . PERIPHERAL VASCULAR CATHETERIZATION Right 06/27/2015   Procedure: Lower Extremity Angiography;  Surgeon: Algernon [name], MD;  Location: [facility];  Service: Cardiovascular;  Laterality: Right;  . PERIPHERAL VASCULAR CATHETERIZATION  06/27/2015   Procedure: Lower Extremity Intervention;  Surgeon: Algernon [name], MD;  Location: [facility];  Service: Cardiovascular;;    Social History Social History   Tobacco Use  . Smoking status: Never Smoker  . Smokeless tobacco: Former Network engineer Use Topics  . Alcohol use: No  . Drug use: No    Family History Family History  Problem Relation Age of Onset  . Stroke Father   . Aneurysm Father   . Hypertension Father   . Prostate cancer Brother     . Hypertension Mother   . Lung cancer Brother        smoker  . Thrombosis Brother   . Other Brother        RF valve disorder (smoker)  . Lung cancer Brother        smoker  . Breast cancer Sister   . Liver cancer Brother   . Other Sister        cerebral hemorrhage    Allergies  Allergen Reactions  . Isosorbide Mononitrate Other (See Comments)    Unknown,  doesn't remember   . Ramipril Cough  . Quinidine Gluconate Rash    REVIEW OF SYSTEMS (Negative unless checked)  Constitutional: '[]' Weight loss  '[]' Fever  '[]' Chills Cardiac: '[]' Chest pain   '[]' Chest pressure   '[x]' Palpitations   '[]' Shortness of breath when laying flat   '[]' Shortness of breath at rest   '[x]' Shortness of breath with exertion. Vascular:  '[x]' Pain in legs with walking   '[]' Pain in legs at rest   '[]' Pain in legs when laying flat   '[x]' Claudication   '[]' Pain in feet when walking  '[]' Pain in feet at rest  '[]' Pain in feet when laying flat   '[]' History of DVT   '[]' Phlebitis   '[]' Swelling in legs   '[]' Varicose veins   '[]' Non-healing ulcers Pulmonary:   '[]' Uses home oxygen   '[]' Productive cough   '[]' Hemoptysis   '[]' Wheeze  '[]' COPD   '[]' Asthma Neurologic:  '[]' Dizziness  '[]' Blackouts   '[]' Seizures   '[]' History of stroke   '[]' History of TIA  '[]' Aphasia   '[]' Temporary blindness   '[]' Dysphagia   '[]' Weakness or numbness in arms   '[x]' Weakness or numbness in legs Musculoskeletal:  '[x]' Arthritis   '[]' Joint swelling   '[]' Joint pain   '[]' Low back pain Hematologic:  '[]' Easy bruising  '[]' Easy bleeding   '[]' Hypercoagulable state   '[]' Anemic   Gastrointestinal:  '[]' Blood in stool   '[]' Vomiting blood  '[]' Gastroesophageal reflux/heartburn   '[]' Abdominal pain Genitourinary:  '[]' Chronic kidney disease   '[]' Difficult urination  '[]' Frequent urination  '[]' Burning with urination   '[]' Hematuria Skin:  '[]' Rashes   '[x]' Ulcers   '[x]' Wounds Psychological:  '[]' History of anxiety   '[]'  History of major depression.   Physical Examination  BP 135/71 (BP Location: Right Arm)   Pulse 60   Resp 16   Ht '5\' 5"'   (1.651 m)   Wt 81.2 kg (179 lb)   BMI 29.79 kg/m  Gen:  WD/WN, NAD.  Appears younger than stated age Head: [place]/AT, No temporalis wasting. Ear/Nose/Throat: Hearing grossly intact, nares w/o erythema or drainage Eyes: Conjunctiva clear. Sclera non-icteric Neck: Supple.  Trachea midline Pulmonary:  Good air movement, no use of accessory muscles.  Cardiac: RRR, no JVD Vascular:  Vessel Right Left  Radial Palpable Palpable                          PT  1+ palpable  trace palpable  DP  1+ palpable  1+ palpable    Musculoskeletal: M/S 5/5 throughout.  No deformity or atrophy. Left great toe with ulceration and black eschar as described above. Mild to moderate erythema. Mild swelling.   Neurologic: Sensation grossly intact in extremities.  Symmetrical.  Speech is fluent.  Psychiatric: Judgment intact, Mood & affect appropriate for pt's clinical situation. Dermatologic: left great toe as above.        Labs Recent Results (from the past 2160 hour(s))  Hemoglobin A1c     Status: Abnormal   Collection Time: 09/07/17  8:09 AM  Result Value Ref Range   Hgb A1c MFr Bld 6.0 (H) <5.7 % of total Hgb    Comment: For someone without known diabetes, a hemoglobin  A1c value between 5.7% and 6.4% is consistent with prediabetes and should be confirmed with a  follow-up test. . For someone with known diabetes, a value <7% indicates that their diabetes is well controlled. A1c targets should be individualized based on duration of diabetes, age, comorbid conditions, and other considerations. . This assay result is consistent with an increased risk of  diabetes. . Currently, no consensus exists regarding use of hemoglobin A1c for diagnosis of diabetes for children. .    Mean Plasma Glucose 126 (calc)   eAG (mmol/L) 7.0 (calc)  Lipid panel     Status: Abnormal   Collection Time: 09/07/17  8:09 AM  Result Value Ref Range   Cholesterol 103 <200 mg/dL   HDL 21 (L) >40 mg/dL    Triglycerides 128 <150 mg/dL   LDL Cholesterol (Calc) 61 mg/dL (calc)    Comment: Reference range: <100 . Desirable range <100 mg/dL for primary prevention;   <70 mg/dL for patients with CHD or diabetic patients  with > or = 2 CHD risk factors. [name] LDL-C is now calculated using the Martin-Hopkins  calculation, which is a validated novel method providing  better accuracy than the Friedewald equation in the  estimation of LDL-C.  Cresenciano Genre et al. Annamaria Helling. 0258;527(78): 2061-2068  (http://education.[employer].com/faq/FAQ164)    Total CHOL/HDL Ratio 4.9 <5.0 (calc)   Non-HDL Cholesterol (Calc) 82 <130 mg/dL (calc)    Comment: For patients with diabetes plus 1 major ASCVD risk  factor, treating to a non-HDL-C goal of <100 mg/dL  (LDL-C of <70 mg/dL) is considered a therapeutic  option.   Urinalysis, Routine w reflex microscopic     Status: Abnormal   Collection Time: 09/29/17 12:25 PM  Result Value Ref Range   Color, Urine YELLOW YELLOW   APPearance CLEAR CLEAR   Specific Gravity, Urine 1.014 1.005 - 1.030   pH 6.0 5.0 - 8.0   Glucose, UA NEGATIVE NEGATIVE mg/dL   Hgb urine dipstick NEGATIVE NEGATIVE   Bilirubin Urine NEGATIVE NEGATIVE   Ketones, ur NEGATIVE NEGATIVE mg/dL   Protein, ur 30 (A) NEGATIVE mg/dL   Nitrite NEGATIVE NEGATIVE   Leukocytes, UA TRACE (A) NEGATIVE   RBC / HPF 0-5 0 - 5 RBC/hpf   WBC, UA 0-5 0 - 5 WBC/hpf   Bacteria, UA NONE SEEN NONE SEEN   Squamous Epithelial / LPF NONE SEEN NONE SEEN    Comment: Performed at [facility], [address] [place], [place] [address]  Comprehensive metabolic panel     Status: Abnormal   Collection Time: 11/10/17 10:36 AM  Result Value Ref Range   Sodium 130 (L) 135 - 145 mmol/L   Potassium 5.7 (H) 3.5 - 5.1 mmol/L   Chloride 98 (L) 101 - 111 mmol/L   CO2 23 22 - 32 mmol/L   Glucose, Bld 87 65 - 99 mg/dL   BUN 32 (H) 6 - 20 mg/dL   Creatinine, Ser 1.79 (H) 0.61 - 1.24 mg/dL   Calcium 9.4 8.9 - 10.3 mg/dL   Total  Protein 6.9 6.5 - 8.1 g/dL   Albumin 3.3 (L) 3.5 - 5.0 g/dL   AST 38 15 - 41 U/L   ALT 19 17 - 63 U/L   Alkaline Phosphatase 38 38 - 126 U/L   Total Bilirubin 0.8 0.3 - 1.2 mg/dL   GFR calc non Af Amer 32 (L) >60 mL/min   GFR calc Af Amer 38 (L) >60 mL/min    Comment: (NOTE) The eGFR has been calculated using the CKD EPI equation. This calculation has not been validated in all clinical situations. eGFR's persistently <60 mL/min signify possible Chronic Kidney Disease.    Anion gap 9 5 - 15    Comment: Performed at [facility] [address], [place], [place] [address]  CBC with Differential     Status: Abnormal   Collection  Time: 11/10/17 10:36 AM  Result Value Ref Range   WBC 11.2 (H) 4.0 - 10.5 K/uL   RBC 3.67 (L) 4.22 - 5.81 MIL/uL   Hemoglobin 11.2 (L) 13.0 - 17.0 g/dL   HCT 34.2 (L) 39.0 - 52.0 %   MCV 93.2 78.0 - 100.0 fL   MCH 30.5 26.0 - 34.0 pg   MCHC 32.7 30.0 - 36.0 g/dL   RDW 15.2 11.5 - 15.5 %   Platelets 221 150 - 400 K/uL   Neutrophils Relative % 66 %   Neutro Abs 7.3 1.7 - 7.7 K/uL   Lymphocytes Relative 24 %   Lymphs Abs 2.7 0.7 - 4.0 K/uL   Monocytes Relative 9 %   Monocytes Absolute 1.0 0.1 - 1.0 K/uL   Eosinophils Relative 1 %   Eosinophils Absolute 0.1 0.0 - 0.7 K/uL   Basophils Relative 0 %   Basophils Absolute 0.0 0.0 - 0.1 K/uL    Comment: Performed at [facility], [address] [place], [place] [address]  I-Stat CG4 Lactic Acid, ED     Status: None   Collection Time: 11/10/17 10:46 AM  Result Value Ref Range   Lactic Acid, Venous 1.20 0.5 - 1.9 mmol/L  Blood culture (routine x 2)     Status: None   Collection Time: 11/10/17  1:08 PM  Result Value Ref Range   Specimen Description BLOOD RIGHT ANTECUBITAL    Special Requests      BOTTLES DRAWN AEROBIC AND ANAEROBIC Blood Culture adequate volume   Culture      NO GROWTH 5 DAYS Performed at [facility], [place] [address], [place], [place] [address]    Report Status  11/15/2017 FINAL   Blood culture (routine x 2)     Status: None   Collection Time: 11/10/17  1:08 PM  Result Value Ref Range   Specimen Description BLOOD BLOOD RIGHT WRIST    Special Requests      BOTTLES DRAWN AEROBIC ONLY Blood Culture adequate volume   Culture      NO GROWTH 5 DAYS Performed at [facility], [place] [address] [place], [place] [address]    Report Status 11/15/2017 FINAL   Urinalysis, Routine w reflex microscopic     Status: None   Collection Time: 11/10/17  3:45 PM  Result Value Ref Range   Color, Urine YELLOW YELLOW   APPearance CLEAR CLEAR   Specific Gravity, Urine 1.009 1.005 - 1.030   pH 6.0 5.0 - 8.0   Glucose, UA NEGATIVE NEGATIVE mg/dL   Hgb urine dipstick NEGATIVE NEGATIVE   Bilirubin Urine NEGATIVE NEGATIVE   Ketones, ur NEGATIVE NEGATIVE mg/dL   Protein, ur NEGATIVE NEGATIVE mg/dL   Nitrite NEGATIVE NEGATIVE   Leukocytes, UA NEGATIVE NEGATIVE    Comment: Performed at [facility] [address], [place], [place] [address]  Glucose, capillary     Status: None   Collection Time: 11/10/17  5:31 PM  Result Value Ref Range   Glucose-Capillary 94 65 - 99 mg/dL  HIV antibody     Status: None   Collection Time: 11/10/17  7:36 PM  Result Value Ref Range   HIV Screen 4th Generation wRfx Non Reactive Non Reactive    Comment: (NOTE) Performed At: [facility] [place], [place] [id number] Rush Farmer MD [id number] Performed at [facility], [place] [address] [place], [place] [address]   Sedimentation rate     Status:  Abnormal   Collection Time: 11/10/17  7:36 PM  Result Value Ref Range   Sed Rate 78 (H) 0 - 16 mm/hr    Comment: Performed at [facility] [address] [place], [place] [address]  C-reactive protein     Status: Abnormal   Collection Time: 11/10/17  7:36 PM  Result Value Ref Range   CRP 7.1 (H) <1.0 mg/dL    Comment: Performed at [facility] [address] [place], [place]  [address]  Prealbumin     Status: Abnormal   Collection Time: 11/10/17  7:36 PM  Result Value Ref Range   Prealbumin 12.3 (L) 18 - 38 mg/dL    Comment: Performed at [facility] [address], [place], [place] [address]  Glucose, capillary     Status: Abnormal   Collection Time: 11/10/17  9:18 PM  Result Value Ref Range   Glucose-Capillary 119 (H) 65 - 99 mg/dL  Basic metabolic panel     Status: Abnormal   Collection Time: 11/11/17  5:12 AM  Result Value Ref Range   Sodium 134 (L) 135 - 145 mmol/L   Potassium 4.9 3.5 - 5.1 mmol/L   Chloride 106 101 - 111 mmol/L   CO2 19 (L) 22 - 32 mmol/L   Glucose, Bld 86 65 - 99 mg/dL   BUN 27 (H) 6 - 20 mg/dL   Creatinine, Ser 1.71 (H) 0.61 - 1.24 mg/dL   Calcium 8.7 (L) 8.9 - 10.3 mg/dL   GFR calc non Af Amer 34 (L) >60 mL/min   GFR calc Af Amer 40 (L) >60 mL/min    Comment: (NOTE) The eGFR has been calculated using the CKD EPI equation. This calculation has not been validated in all clinical situations. eGFR's persistently <60 mL/min signify possible Chronic Kidney Disease.    Anion gap 9 5 - 15    Comment: Performed at [facility] [address] [place], [place] [address]  CBC     Status: Abnormal   Collection Time: 11/11/17  5:12 AM  Result Value Ref Range   WBC 10.2 4.0 - 10.5 K/uL   RBC 3.42 (L) 4.22 - 5.81 MIL/uL   Hemoglobin 10.6 (L) 13.0 - 17.0 g/dL   HCT 31.8 (L) 39.0 - 52.0 %   MCV 93.0 78.0 - 100.0 fL   MCH 31.0 26.0 - 34.0 pg   MCHC 33.3 30.0 - 36.0 g/dL   RDW 15.3 11.5 - 15.5 %   Platelets 206 150 - 400 K/uL    Comment: Performed at [facility], [place] [address] [place], [place] [address]  Glucose, capillary     Status: None   Collection Time: 11/11/17  7:45 AM  Result Value Ref Range   Glucose-Capillary 81 65 - 99 mg/dL  Glucose, capillary     Status: None   Collection Time: 11/11/17 11:15 AM  Result Value Ref Range   Glucose-Capillary 90 65 - 99 mg/dL  APTT     Status: Abnormal   Collection  Time: 11/11/17  1:09 PM  Result Value Ref Range   aPTT 40 (H) 24 - 36 seconds    Comment:        IF BASELINE aPTT IS ELEVATED, SUGGEST PATIENT RISK ASSESSMENT BE USED TO DETERMINE APPROPRIATE ANTICOAGULANT THERAPY. Performed at [facility], [place] [address], [place], [place] [address]   Heparin level (unfractionated)     Status: Abnormal   Collection Time: 11/11/17  1:09 PM  Result Value Ref Range   Heparin Unfractionated 0.84 (H) 0.30 - 0.70 IU/mL    Comment:        IF HEPARIN RESULTS ARE BELOW EXPECTED VALUES, AND PATIENT DOSAGE HAS BEEN CONFIRMED, SUGGEST FOLLOW UP TESTING OF ANTITHROMBIN III LEVELS. Performed at [facility], [place] [address], [place], [place] [address]   Glucose, capillary     Status: Abnormal   Collection Time: 11/11/17  4:51 PM  Result Value Ref Range   Glucose-Capillary 138 (H) 65 - 99 mg/dL  Glucose, capillary     Status: Abnormal   Collection Time: 11/11/17  9:17 PM  Result Value Ref Range   Glucose-Capillary 108 (H) 65 - 99 mg/dL  APTT     Status: Abnormal   Collection Time: 11/11/17 11:40 PM  Result Value Ref Range   aPTT 93 (H) 24 - 36 seconds    Comment:        IF BASELINE aPTT IS ELEVATED, SUGGEST PATIENT RISK ASSESSMENT BE USED TO DETERMINE APPROPRIATE ANTICOAGULANT THERAPY. Performed at [facility], [place] [address], [place], [place] [address]   APTT     Status: Abnormal   Collection Time: 11/12/17  7:53 AM  Result Value Ref Range   aPTT 69 (H) 24 - 36 seconds    Comment:        IF BASELINE aPTT IS ELEVATED, SUGGEST PATIENT RISK ASSESSMENT BE USED TO DETERMINE APPROPRIATE ANTICOAGULANT THERAPY. Performed at [facility], [place] [address] [place], [place] [address]   Heparin level (unfractionated)     Status: None   Collection Time: 11/12/17  7:53 AM  Result Value Ref Range   Heparin Unfractionated 0.47 0.30 - 0.70 IU/mL    Comment:        IF HEPARIN RESULTS ARE BELOW EXPECTED VALUES, AND PATIENT DOSAGE HAS  BEEN CONFIRMED, SUGGEST FOLLOW UP TESTING OF ANTITHROMBIN III LEVELS. Performed at [facility], [place] [address] [place], [place] [address]   CBC     Status: Abnormal   Collection Time: 11/12/17  7:53 AM  Result Value Ref Range   WBC 10.8 (H) 4.0 - 10.5 K/uL   RBC 3.67 (L) 4.22 - 5.81 MIL/uL   Hemoglobin 11.2 (L) 13.0 - 17.0 g/dL   HCT 34.5 (L) 39.0 - 52.0 %   MCV 94.0 78.0 - 100.0 fL   MCH 30.5 26.0 - 34.0 pg   MCHC 32.5 30.0 - 36.0 g/dL   RDW 15.4 11.5 - 15.5 %   Platelets 249 150 - 400 K/uL    Comment: Performed at [facility], [place] [address], [place], [place] [address]  Basic metabolic panel     Status: Abnormal   Collection Time: 11/12/17  7:53 AM  Result Value Ref Range   Sodium 135 135 - 145 mmol/L   Potassium 4.1 3.5 - 5.1 mmol/L   Chloride 107 101 - 111 mmol/L   CO2 19 (L) 22 - 32 mmol/L   Glucose, Bld 113 (H) 65 - 99 mg/dL   BUN 26 (H) 6 - 20 mg/dL   Creatinine, Ser 1.51 (H) 0.61 - 1.24 mg/dL   Calcium 9.2 8.9 - 10.3 mg/dL   GFR calc non Af Amer 40 (L) >60 mL/min   GFR calc Af Amer 46 (L) >60 mL/min    Comment: (NOTE) The eGFR has been calculated using the CKD EPI equation. This calculation has not been validated in all clinical situations. eGFR's persistently <  60 mL/min signify possible Chronic Kidney Disease.    Anion gap 9 5 - 15    Comment: Performed at [facility] [address] [place], [place] [address]  Glucose, capillary     Status: Abnormal   Collection Time: 11/12/17  7:55 AM  Result Value Ref Range   Glucose-Capillary 144 (H) 65 - 99 mg/dL  Glucose, capillary     Status: Abnormal   Collection Time: 11/12/17 11:44 AM  Result Value Ref Range   Glucose-Capillary 123 (H) 65 - 99 mg/dL  Glucose, capillary     Status: Abnormal   Collection Time: 11/12/17  4:41 PM  Result Value Ref Range   Glucose-Capillary 112 (H) 65 - 99 mg/dL  Glucose, capillary     Status: Abnormal   Collection Time: 11/12/17  8:47 PM  Result Value Ref  Range   Glucose-Capillary 166 (H) 65 - 99 mg/dL  APTT     Status: Abnormal   Collection Time: 11/13/17  4:23 AM  Result Value Ref Range   aPTT 64 (H) 24 - 36 seconds    Comment:        IF BASELINE aPTT IS ELEVATED, SUGGEST PATIENT RISK ASSESSMENT BE USED TO DETERMINE APPROPRIATE ANTICOAGULANT THERAPY. Performed at [facility], [place] [address], [place], [place] [address]   Heparin level (unfractionated)     Status: Abnormal   Collection Time: 11/13/17  4:23 AM  Result Value Ref Range   Heparin Unfractionated 0.14 (L) 0.30 - 0.70 IU/mL    Comment:        IF HEPARIN RESULTS ARE BELOW EXPECTED VALUES, AND PATIENT DOSAGE HAS BEEN CONFIRMED, SUGGEST FOLLOW UP TESTING OF ANTITHROMBIN III LEVELS. Performed at [facility], [place] [address], [place], [place] [address]   CBC     Status: Abnormal   Collection Time: 11/13/17  4:23 AM  Result Value Ref Range   WBC 12.3 (H) 4.0 - 10.5 K/uL   RBC 3.66 (L) 4.22 - 5.81 MIL/uL   Hemoglobin 11.3 (L) 13.0 - 17.0 g/dL   HCT 34.4 (L) 39.0 - 52.0 %   MCV 94.0 78.0 - 100.0 fL   MCH 30.9 26.0 - 34.0 pg   MCHC 32.8 30.0 - 36.0 g/dL   RDW 15.6 (H) 11.5 - 15.5 %   Platelets 294 150 - 400 K/uL    Comment: Performed at [facility], [place] [address], [place],  [address]  Basic metabolic panel     Status: Abnormal   Collection Time: 11/13/17  4:23 AM  Result Value Ref Range   Sodium 136 135 - 145 mmol/L   Potassium 4.5 3.5 - 5.1 mmol/L   Chloride 105 101 - 111 mmol/L   CO2 20 (L) 22 - 32 mmol/L   Glucose, Bld 95 65 - 99 mg/dL   BUN 28 (H) 6 - 20 mg/dL   Creatinine, Ser 1.27 (H) 0.61 - 1.24 mg/dL   Calcium 9.6 8.9 - 10.3 mg/dL   GFR calc non Af Amer 49 (L) >60 mL/min   GFR calc Af Amer 57 (L) >60 mL/min    Comment: (NOTE) The eGFR has been calculated using the CKD EPI equation. This calculation has not been validated in all clinical situations. eGFR's persistently <60 mL/min signify possible Chronic Kidney Disease.     Anion gap 11 5 - 15    Comment: Performed at [facility] [address], [place], [place] [address]  Glucose, capillary  Status: None   Collection Time: 11/13/17  8:19 AM  Result Value Ref Range   Glucose-Capillary 93 65 - 99 mg/dL  Glucose, capillary     Status: None   Collection Time: 11/13/17  8:37 AM  Result Value Ref Range   Glucose-Capillary 93 65 - 99 mg/dL  Glucose, capillary     Status: Abnormal   Collection Time: 11/13/17 11:44 AM  Result Value Ref Range   Glucose-Capillary 116 (H) 65 - 99 mg/dL  Heparin level (unfractionated)     Status: Abnormal   Collection Time: 11/13/17  2:40 PM  Result Value Ref Range   Heparin Unfractionated 0.29 (L) 0.30 - 0.70 IU/mL    Comment:        IF HEPARIN RESULTS ARE BELOW EXPECTED VALUES, AND PATIENT DOSAGE HAS BEEN CONFIRMED, SUGGEST FOLLOW UP TESTING OF ANTITHROMBIN III LEVELS. Performed at [facility], [place] [address], [place], [place] [address]   Glucose, capillary     Status: Abnormal   Collection Time: 11/13/17  4:45 PM  Result Value Ref Range   Glucose-Capillary 102 (H) 65 - 99 mg/dL  Glucose, capillary     Status: Abnormal   Collection Time: 11/13/17  8:45 PM  Result Value Ref Range   Glucose-Capillary 145 (H) 65 - 99 mg/dL  Heparin level (unfractionated)     Status: None   Collection Time: 11/14/17 12:06 AM  Result Value Ref Range   Heparin Unfractionated 0.67 0.30 - 0.70 IU/mL    Comment:        IF HEPARIN RESULTS ARE BELOW EXPECTED VALUES, AND PATIENT DOSAGE HAS BEEN CONFIRMED, SUGGEST FOLLOW UP TESTING OF ANTITHROMBIN III LEVELS. Performed at [facility], [place] [address], [place], [place] [address]   Heparin level (unfractionated)     Status: Abnormal   Collection Time: 11/14/17  3:39 AM  Result Value Ref Range   Heparin Unfractionated 2.04 (H) 0.30 - 0.70 IU/mL    Comment: RESULTS CONFIRMED BY MANUAL DILUTION IF HEPARIN RESULTS ARE BELOW EXPECTED VALUES, AND PATIENT DOSAGE HAS BEEN  CONFIRMED, SUGGEST FOLLOW UP TESTING OF ANTITHROMBIN III LEVELS. Performed at [facility], [place] [address] [place], [place] [address]   CBC     Status: Abnormal   Collection Time: 11/14/17  3:59 AM  Result Value Ref Range   WBC 9.9 4.0 - 10.5 K/uL   RBC 3.42 (L) 4.22 - 5.81 MIL/uL   Hemoglobin 10.5 (L) 13.0 - 17.0 g/dL   HCT 31.8 (L) 39.0 - 52.0 %   MCV 93.0 78.0 - 100.0 fL   MCH 30.7 26.0 - 34.0 pg   MCHC 33.0 30.0 - 36.0 g/dL   RDW 15.3 11.5 - 15.5 %   Platelets 247 150 - 400 K/uL    Comment: Performed at [facility], [place] [address] [place], [place] [address]  Glucose, capillary     Status: Abnormal   Collection Time: 11/14/17  7:32 AM  Result Value Ref Range   Glucose-Capillary 107 (H) 65 - 99 mg/dL  Heparin level (unfractionated)     Status: None   Collection Time: 11/14/17  8:13 AM  Result Value Ref Range   Heparin Unfractionated 0.39 0.30 - 0.70 IU/mL    Comment:        IF HEPARIN RESULTS ARE BELOW EXPECTED VALUES, AND PATIENT DOSAGE HAS BEEN CONFIRMED, SUGGEST FOLLOW UP TESTING OF ANTITHROMBIN III LEVELS. Performed at [facility], [place] [address] [place], [place] [address]  Glucose, capillary     Status: Abnormal   Collection Time: 11/14/17 11:27 AM  Result Value Ref Range   Glucose-Capillary 126 (H) 65 - 99 mg/dL  Glucose, capillary     Status: Abnormal   Collection Time: 11/14/17  4:36 PM  Result Value Ref Range   Glucose-Capillary 107 (H) 65 - 99 mg/dL  Glucose, capillary     Status: Abnormal   Collection Time: 11/14/17  8:56 PM  Result Value Ref Range   Glucose-Capillary 158 (H) 65 - 99 mg/dL  Surgical pcr screen     Status: None   Collection Time: 11/14/17 10:17 PM  Result Value Ref Range   MRSA, PCR NEGATIVE NEGATIVE   Staphylococcus aureus NEGATIVE NEGATIVE    Comment: (NOTE) The Xpert SA Assay (FDA approved for NASAL specimens in patients 73 years of age and older), is one component of a comprehensive surveillance program. It  is not intended to diagnose infection nor to guide or monitor treatment. Performed at [facility], [place] [address], [place], [place] [address]   Heparin level (unfractionated)     Status: None   Collection Time: 11/15/17  3:42 AM  Result Value Ref Range   Heparin Unfractionated 0.64 0.30 - 0.70 IU/mL    Comment:        IF HEPARIN RESULTS ARE BELOW EXPECTED VALUES, AND PATIENT DOSAGE HAS BEEN CONFIRMED, SUGGEST FOLLOW UP TESTING OF ANTITHROMBIN III LEVELS. Performed at [facility], [place] [address], [place], [place] [address]   CBC     Status: Abnormal   Collection Time: 11/15/17  3:42 AM  Result Value Ref Range   WBC 12.7 (H) 4.0 - 10.5 K/uL   RBC 3.87 (L) 4.22 - 5.81 MIL/uL   Hemoglobin 11.6 (L) 13.0 - 17.0 g/dL   HCT 36.2 (L) 39.0 - 52.0 %   MCV 93.5 78.0 - 100.0 fL   MCH 30.0 26.0 - 34.0 pg   MCHC 32.0 30.0 - 36.0 g/dL   RDW 15.3 11.5 - 15.5 %   Platelets 315 150 - 400 K/uL    Comment: Performed at [facility], [place] [address], [place], [place] [address]  Protime-INR     Status: Abnormal   Collection Time: 11/15/17  3:42 AM  Result Value Ref Range   Prothrombin Time 15.8 (H) 11.4 - 15.2 seconds   INR 1.27     Comment: Performed at [facility] [address] [place], [place] [address]  Basic metabolic panel     Status: Abnormal   Collection Time: 11/15/17  3:42 AM  Result Value Ref Range   Sodium 136 135 - 145 mmol/L   Potassium 3.9 3.5 - 5.1 mmol/L   Chloride 106 101 - 111 mmol/L   CO2 19 (L) 22 - 32 mmol/L   Glucose, Bld 95 65 - 99 mg/dL   BUN 23 (H) 6 - 20 mg/dL   Creatinine, Ser 1.14 0.61 - 1.24 mg/dL   Calcium 9.6 8.9 - 10.3 mg/dL   GFR calc non Af Amer 56 (L) >60 mL/min   GFR calc Af Amer >60 >60 mL/min    Comment: (NOTE) The eGFR has been calculated using the CKD EPI equation. This calculation has not been validated in all clinical situations. eGFR's persistently <60 mL/min signify possible Chronic Kidney Disease.    Anion gap  11 5 - 15    Comment: Performed at [facility] [address], [place],  [address]  Glucose, capillary     Status: Abnormal   Collection Time: 11/15/17  7:08 AM  Result Value Ref Range   Glucose-Capillary 103 (H) 65 - 99 mg/dL  POCT Activated clotting time     Status: None   Collection Time: 11/15/17  8:41 AM  Result Value Ref Range   Activated Clotting Time 164 seconds  Glucose, capillary     Status: None   Collection Time: 11/15/17  9:05 AM  Result Value Ref Range   Glucose-Capillary 86 65 - 99 mg/dL  Glucose, capillary     Status: Abnormal   Collection Time: 11/15/17  3:53 PM  Result Value Ref Range   Glucose-Capillary 125 (H) 65 - 99 mg/dL  Glucose, capillary     Status: Abnormal   Collection Time: 11/15/17  9:01 PM  Result Value Ref Range   Glucose-Capillary 178 (H) 65 - 99 mg/dL   Comment 1 Notify RN    Comment 2 Document in Chart   Heparin level (unfractionated)     Status: None   Collection Time: 11/16/17  2:54 AM  Result Value Ref Range   Heparin Unfractionated 0.57 0.30 - 0.70 IU/mL    Comment:        IF HEPARIN RESULTS ARE BELOW EXPECTED VALUES, AND PATIENT DOSAGE HAS BEEN CONFIRMED, SUGGEST FOLLOW UP TESTING OF ANTITHROMBIN III LEVELS. Performed at [facility], [place] [address] [place], [place] [address]   CBC     Status: Abnormal   Collection Time: 11/16/17  2:54 AM  Result Value Ref Range   WBC 9.8 4.0 - 10.5 K/uL   RBC 3.72 (L) 4.22 - 5.81 MIL/uL   Hemoglobin 11.1 (L) 13.0 - 17.0 g/dL   HCT 34.8 (L) 39.0 - 52.0 %   MCV 93.5 78.0 - 100.0 fL   MCH 29.8 26.0 - 34.0 pg   MCHC 31.9 30.0 - 36.0 g/dL   RDW 15.4 11.5 - 15.5 %   Platelets 255 150 - 400 K/uL    Comment: Performed at [facility], [place] [address] [place], [place] [address]  Basic metabolic panel     Status: Abnormal   Collection Time: 11/16/17  2:54 AM  Result Value Ref Range   Sodium 136 135 - 145 mmol/L   Potassium 3.6 3.5 - 5.1 mmol/L   Chloride 108 101 - 111  mmol/L   CO2 21 (L) 22 - 32 mmol/L   Glucose, Bld 107 (H) 65 - 99 mg/dL   BUN 16 6 - 20 mg/dL   Creatinine, Ser 1.10 0.61 - 1.24 mg/dL   Calcium 8.8 (L) 8.9 - 10.3 mg/dL   GFR calc non Af Amer 58 (L) >60 mL/min   GFR calc Af Amer >60 >60 mL/min    Comment: (NOTE) The eGFR has been calculated using the CKD EPI equation. This calculation has not been validated in all clinical situations. eGFR's persistently <60 mL/min signify possible Chronic Kidney Disease.    Anion gap 7 5 - 15    Comment: Performed at [facility] [address], [place], [place] [address]  Glucose, capillary     Status: None   Collection Time: 11/16/17  7:01 AM  Result Value Ref Range   Glucose-Capillary 95 65 - 99 mg/dL   Comment 1 Notify RN    Comment 2 Document in Chart     Radiology Mr Foot Left Wo Contrast  Result Date: 11/11/2017 CLINICAL DATA:  Pain and swelling.  Great toe ulcer.  EXAM: MRI OF THE LEFT FOOT WITHOUT CONTRAST TECHNIQUE: Multiplanar, multisequence MR imaging of the left foot was performed. No intravenous contrast was administered. COMPARISON:  Left foot radiographs 11/10/2017 FINDINGS: Examination is quite limited by patient motion. Subcutaneous soft tissue swelling/edema suggesting cellulitis. No findings for myofasciitis or pyomyositis. No discrete drainable fluid collection is identified. No definite findings for septic arthritis or osteomyelitis. IMPRESSION: Very limited examination but no definite MR findings to suggest septic arthritis or osteomyelitis. Cellulitis without discrete drainable soft tissue abscess, myofasciitis or pyomyositis. Electronically Signed   By: Marijo Sanes M.D.   On: 11/11/2017 11:06   Dg Foot Complete Left  Result Date: 11/10/2017 CLINICAL DATA:  Wound at bottom of LEFT foot with redness up to mid calf, history of cellulitis, diabetes mellitus EXAM: LEFT FOOT - COMPLETE 3+ VIEW COMPARISON:  None FINDINGS: Osseous demineralization diffusely. Scattered narrowing  of interphalangeal joints. Remaining joint spaces preserved. Small vessel vascular calcifications consistent with history of diabetes mellitus. No acute fracture, dislocation, or bone destruction. Scattered soft tissue swelling including dorsum of foot. No definite soft tissue gas or radiopaque foreign bodies. IMPRESSION: No acute osseous abnormalities. Electronically Signed   By: Lavonia Dana M.D.   On: 11/10/2017 11:00    Assessment/Plan Diabetes mellitus with neuropathy blood glucose control important in reducing the progression of atherosclerotic disease. Also, involved in wound healing. On appropriate medications.   Essential hypertension blood pressure control important in reducing the progression of atherosclerotic disease. On appropriate oral medications.   Atherosclerosis of native arteries of the extremities with ulceration [facility]) He was recently admitted to [facility] where an angiogram was performed showing only small vessel disease in the foot.  His ABIs today were normal bilaterally but his digital pressure was only 30 on the left and 70 on the right. At this point, I do not think there is anything further from a vascular standpoint that can be done other than local wound care on the foot and toe.  I think the likely outcome is a left great toe amputation as I am afraid the large eschar will essentially become an open wound down to the bone.  Have discussed this with he and his wife.  We will continue to monitor this closely and I will see him back in about 2-3 weeks.    Leotis Pain, MD  11/23/2017 11:21 AM    This note was created with Dragon medical transcription system.  Any errors from dictation are purely unintentional

## 2017-11-23 NOTE — Assessment & Plan Note (Signed)
He was recently admitted to [facility] where an angiogram was performed showing only small vessel disease in the foot.  His ABIs today were normal bilaterally but his digital pressure was only 30 on the left and 70 on the right. At this point, I do not think there is anything further from a vascular standpoint that can be done other than local wound care on the foot and toe.  I think the likely outcome is a left great toe amputation as I am afraid the large eschar will essentially become an open wound down to the bone.  Have discussed this with he and his wife.  We will continue to monitor this closely and I will see him back in about 2-3 weeks.

## 2017-11-24 ENCOUNTER — Encounter (INDEPENDENT_AMBULATORY_CARE_PROVIDER_SITE_OTHER): Payer: Self-pay | Admitting: Orthopedic Surgery

## 2017-11-24 ENCOUNTER — Encounter: Payer: Self-pay | Admitting: Family Medicine

## 2017-11-24 ENCOUNTER — Ambulatory Visit (INDEPENDENT_AMBULATORY_CARE_PROVIDER_SITE_OTHER): Payer: PPO | Admitting: Orthopedic Surgery

## 2017-11-24 DIAGNOSIS — I7025 Atherosclerosis of native arteries of other extremities with ulceration: Secondary | ICD-10-CM

## 2017-11-24 MED ORDER — NITROGLYCERIN 0.2 MG/HR TD PT24: 0.2000 mg | MEDICATED_PATCH | Freq: Every day | TRANSDERMAL | 12 refills | Status: DC

## 2017-11-24 MED ORDER — PENTOXIFYLLINE ER 400 MG PO TBCR: 400.0000 mg | EXTENDED_RELEASE_TABLET | Freq: Three times a day (TID) | ORAL | 3 refills | Status: DC

## 2017-11-24 NOTE — Progress Notes (Signed)
Office Visit Note   Patient: Eric [name].           Date of Birth: 08-Feb-1928           MRN: [id number] Visit Date: 11/24/2017              Requested by: [name], MD [address] [place], [place] [address] PCP: [name], MD  Chief Complaint  Patient presents with  . Left Foot - Pain, Edema      HPI: Patient is a 84 year old gentleman with severe peripheral vascular disease.  He has a gangrenous ulcer to the left great toe.  Patient has been worked up with vascular vein surgery and has good in-line flow down to the ankle and no microcirculation distal to the ankle.  He has had an MRI scan as well as radiographs which were reviewed which shows no definite osteomyelitis no definite abscess.  Patient does have A. fib and is on Eliquis for this.  Assessment & Plan: Visit Diagnoses:  1. Atherosclerosis of native arteries of the extremities with ulceration ([place])     Plan: We will add Trental and nitroglycerin to help improve the microcirculation to his foot he will continue his antibiotic ointment and continue with oral antibiotics.  Will reevaluate in 1 week.  Follow-Up Instructions: Return in about 1 week (around 12/01/2017).   Ortho Exam  Patient is alert, oriented, no adenopathy, well-dressed, normal affect, normal respiratory effort. Examination I cannot palpate a dorsalis pedis or posterior tibial pulse.  Doppler was used he has a dampened monophasic dorsalis pedis and posterior tibial pulse on the left.  He has an irregular rate consistent with atrial fibrillation.  He has a black eschar over the medial aspect of the MTP joint with dermatitis.  There is no exposed bone or tendon.  Imaging: No results found. No images are attached to the encounter.  Labs: Lab Results  Component Value Date   HGBA1C 6.0 (H) 09/07/2017   HGBA1C 5.9 (H) 03/01/2017   HGBA1C 6.2 (H) 08/13/2016   ESRSEDRATE 78 (H) 11/10/2017   CRP 7.1 (H) 11/10/2017   REPTSTATUS 11/15/2017 FINAL 11/10/2017   REPTSTATUS 11/15/2017 FINAL 11/10/2017   CULT  11/10/2017    NO GROWTH 5 DAYS Performed at [facility], [place] [address] [place], [place] [address]    CULT  11/10/2017    NO GROWTH 5 DAYS Performed at [facility] [address] [place], [place] [address]     @LABSALLVALUES (HGBA1)@  There is no height or weight on file to calculate BMI.  Orders:  No orders of the defined types were placed in this encounter.  Meds ordered this encounter  Medications  . nitroGLYCERIN (NITRODUR - DOSED IN MG/24 HR) 0.2 mg/hr patch    Sig: Place 1 patch (0.2 mg total) onto the skin daily.    Dispense:  30 patch    Refill:  12  . pentoxifylline (TRENTAL) 400 MG CR tablet    Sig: Take 1 tablet (400 mg total) by mouth 3 (three) times daily with meals.    Dispense:  90 tablet    Refill:  3     Procedures: No procedures performed  Clinical Data: No additional findings.  ROS:  All other systems negative, except as noted in the HPI. Review of Systems  Objective: Vital Signs: There were no vitals taken for this visit.  Specialty Comments:  No specialty comments available.  PMFS History:  Patient Active Problem List   Diagnosis Date Noted  . Atherosclerosis of native arteries of the extremities with ulceration ([place]) 11/23/2017  . Cellulitis 11/10/2017  . Chronic anticoagulation 09/26/2015  . Persistent atrial fibrillation ([place]) 09/26/2015  . Olecranon bursitis 04/23/2014  . Preop cardiovascular exam 04/13/2013  . Routine general medical examination at a health care facility 10/10/2012  . PVD (peripheral vascular disease) ([place]) 06/02/2012  . Diabetes mellitus type 2 with peripheral artery disease ([place]) 05/17/2012  . Microhematuria 11/16/2011  . BPH (benign prostatic hyperplasia) 11/16/2011  . Chronic kidney disease, stage III (moderate) ([place]) 08/26/2011  . Leg pain 08/26/2011  . Coronary artery disease 03/25/2011  . OBSTRUCTIVE  SLEEP APNEA 09/15/2010  . PERIODIC LIMB MOVEMENT DISORDER 09/15/2010  . SLEEP APNEA 08/26/2010  . PERSONAL HISTORY OF COLONIC POLYPS 10/25/2009  . VITAMIN B12 DEFICIENCY 03/04/2009  . Diabetes mellitus with neuropathy ([place]) 11/24/2006  . HYPERTRIGLYCERIDEMIA 11/24/2006  . Essential hypertension 11/24/2006   Past Medical History:  Diagnosis Date  . Atrial flutter ([place])    s/p RFCA  . CAD (coronary artery disease)    cath 2003, occluded S-RCA, occluded S-Dx, L-LAD ok, s/p PTCA to LAD  . Cataract   . CKD (chronic kidney disease) stage 3, GFR 30-59 ml/min (H[facility])   . Diabetes mellitus    diet controlled  . Hearing aid worn    bilateral  . Long term (current) use of anticoagulants   . Neuromuscular disorder ([place])   . OSA (obstructive sleep apnea) 12/11   very mild, AHI 7/hr  . Persistent atrial fibrillation ([place]) 09/26/2015  . Pure hyperglyceridemia   . PVD (peripheral vascular disease) ([place])    angioplasty of his right lower extremity in [place] by Dr.[name] 2013  . Unspecified essential hypertension   . Wears dentures    full upper and lower    Family History  Problem Relation Age of Onset  . Stroke Father   . Aneurysm Father   . Hypertension Father   . Prostate cancer Brother   . Hypertension Mother   . Lung cancer Brother        smoker  . Thrombosis Brother   . Other Brother        RF valve disorder (smoker)  . Lung cancer Brother        smoker  . Breast cancer Sister   . Liver cancer Brother   . Other Sister        cerebral hemorrhage    Past Surgical History:  Procedure Laterality Date  . ABDOMINAL AORTOGRAM W/LOWER EXTREMITY N/A 11/15/2017   Procedure: ABDOMINAL AORTOGRAM W/LOWER EXTREMITY;  Surgeon: [name], MD;  Location: [facility];  Service: Cardiovascular;  Laterality: N/A;  . Adenosine Myoview  3/06   EF 56%, neg. Ischemia  . Adenosine Myoview  02/18/07   nml  . ANGIOPLASTY  1/99   CAD- diogonal with rotational artherectomy  .  Arthrectomy     of LAD & PTCA  . BLEPHAROPLASTY Bilateral   . CARDIAC CATHETERIZATION  1/00  . CARDIOVERSION  1/04  . CARDIOVERSION  5/07   hospital- a flutter  . CARPAL TUNNEL RELEASE     ? bilateral  . CATARACT EXTRACTION W/PHACO Right 07/28/2017   Procedure: CATARACT EXTRACTION PHACO AND INTRAOCULAR LENS PLACEMENT ([place]) RIGHT DIABETIC;  Surgeon: [name], MD;  Location: [facility];  Service: Ophthalmology;  Laterality: Right;  Diabetic - diet controlled  . CATARACT EXTRACTION W/PHACO Left 08/18/2017   Procedure: CATARACT  EXTRACTION PHACO AND INTRAOCULAR LENS PLACEMENT (I[facility]);  Surgeon: [name], MD;  Location: [facility];  Service: Ophthalmology;  Laterality: Left;  DIABETES - oral meds  . COLONOSCOPY W/ BIOPSIES  10/01/06   sigmoid polyp bx neg, 3 years  . CORONARY ANGIOPLASTY  4/03   cutting balloon PTCA pLAD into Diag  . CORONARY ARTERY BYPASS GRAFT  2000   LIMA-LAD, SVG-RCA, SVG-Diag; SVG-Diag & SVG-RCA occluded 2003  . FRACTURE SURGERY    . HAND SURGERY  08/27/09   R thumb procedure wit Scaphoid Gragt and screws, Dr [name]  . NM MYOVIEW LTD  4/11   normal  . PERIPHERAL VASCULAR CATHETERIZATION Right 06/27/2015   Procedure: Lower Extremity Angiography;  Surgeon: Algernon [name], MD;  Location: [facility];  Service: Cardiovascular;  Laterality: Right;  . PERIPHERAL VASCULAR CATHETERIZATION  06/27/2015   Procedure: Lower Extremity Intervention;  Surgeon: Algernon [name], MD;  Location: [facility];  Service: Cardiovascular;;   Social History   Occupational History  . Occupation: [employer]    Employer: RETIRED  Tobacco Use  . Smoking status: Never Smoker  . Smokeless tobacco: Former Network engineer and Sexual Activity  . Alcohol use: No  . Drug use: No  . Sexual activity: Never

## 2017-11-25 ENCOUNTER — Encounter: Payer: Self-pay | Admitting: Family Medicine

## 2017-11-25 ENCOUNTER — Ambulatory Visit (INDEPENDENT_AMBULATORY_CARE_PROVIDER_SITE_OTHER): Payer: PPO | Admitting: Family Medicine

## 2017-11-25 VITALS — BP 134/58 | HR 48 | Temp 97.6°F | Resp 14 | Ht 65.0 in | Wt 169.0 lb

## 2017-11-25 DIAGNOSIS — Z09 Encounter for follow-up examination after completed treatment for conditions other than malignant neoplasm: Secondary | ICD-10-CM | POA: Diagnosis not present

## 2017-11-25 MED ORDER — GLUCOSE BLOOD VI STRP: ORAL_STRIP | 3 refills | Status: DC

## 2017-11-25 NOTE — Progress Notes (Signed)
Subjective:    Patient ID: Eric [name]., male    DOB: 1927/09/06, 84 y.o.   MRN: [id number]  HPI  Patient was admitted to the hospital.  I have copied relevant portions of the discharge summary below for my reference:  Admit date: 11/10/2017 Discharge date: 11/16/2017  Time spent: 45 minutes  Recommendations for Outpatient Follow-up:  Patient will be discharged to home.  Patient will need to follow up with primary care provider within one week of discharge.  Follow up with [name], vascular surgery. Follow up with [name], orthopedics, in one week. Patient should continue medications as prescribed.  Patient should follow a heart healthy/carb modified diet.   Discharge Diagnoses:  Left lower extremitycellulitisand wound infection/PVD Diabetes mellitus, type II Atrial fibrillation Essential hypertension Chronic kidney disease, stage III Leukocytosis  Discharge Condition: Stable  Diet recommendation: heart healthy/carb modified       Filed Weights   11/10/17 2122 11/13/17 2044 11/16/17 0257  Weight: 78.8 kg (173 lb 11.6 oz) 78.9 kg (174 lb) 80.4 kg (177 lb 4 oz)    History of present illness:  on 11/10/2017 by Eric [name] Eric Mckayis a 84 y.o.malewith medical history significant ofHTNl HLD; PVD s/p RLE angioplasty; afib on AC; OSA; DM; stage 3 CKD; and CAD s/p PCI presenting withleftfoot cellulitis. When asked why he came to the hospital, he said, "The doctor said I needed to". He has a sore on his foot and "it's real red, raised, and they're afraid it's gonna go in deep if he don'tget something done about it". It's been there "a while". No fevers. He feels well otherwise.He previously had an infected blister on his right foot for which he was followed by podiatry. He was seen yesterday by [name] for this issue and started on Bactrim and Augmentin. He was seen again today by [name] and sent to the ER due to progressive  infection.  Hospital Course:  Left lower extremitycellulitisand wound infection/PVD -Patient was placed on oral antibiotics, Augmentin and Bactrim prior to admission. He states that his wound has been there for several weeks. Patient was seeing a foot doctor for his right foot previously. -Left foot x-ray showed no acute osseous abnormalities -Obtained MRI without contrast of the left foot: Showing no septic arthritis or osteomyelitis. Cellulitis without discrete drainable soft tissue abscess, mild fasciitis or pyomyositis -Was placed on vancomycin, Flagyl, ceftriaxone.  -CRP 7.1, ESR 78 -blood cultures show no growth to date -ABI R 0.91, L 0.89 (with monophasic waves??) -Cellulitis does appear to be improving -Discussed with [name], patient could still have osteo, but recommended vascular follow up -Vascular surgery consulted and appreciated; patient sees with vasc in [place], [place]-[name] and has intervention on RLE -Vascular surgery recommended angiography-has unreconstructable pedal disease, recommend continued medical management with IV antibiotics and local wound care. No vascular intervention possible for the left lower extremity. -Discussed with [name] and [name]. Will place on oral antibiotics and patient can follow up with [name] (vasc surg) and [name] upon discharge. -Will discharge on Augmentin for additional 9 days  Diabetes mellitus, type II -Currently diet controlled, last hemoglobin A1c was 6 on 09/07/2017  Atrial fibrillation -Continue Eliquis -Continue metoprolol   Essential hypertension -Continue metoprolol, amlodipine -Lisinopril and HCTZ currently held- may resume on discharge  Chronic kidney disease, stage III -Creatinine currently within stage III- creatinine 1.10 -Continue to monitor BMP  Leukocytosis -currently on antibiotics -Resolved -patient afebrile,  with no complaints of urinary issues or upper respiratory issues -blood  cultures shows no growth to date  Consultants Orthopedic surgery Vascular surgery  Procedures  ABI Abdominal aortogram with bilateral lower extremity runoff third order catheterization left superficial femoral artery   Patient is here today for follow-up.  The erythema in his left leg has improved dramatically after antibiotics.  There is still residual erythema on the distal third of his midfoot however there is no warmth or pain.  There is black eschar on the medial aspect of the left great toe.  There is also desquamation around the first MTP joint with callus formation on the plantar aspect of the first MTP joint.  Patient has palpable pedal pulses however his issues have been distal to the MTP joints due to inadequate blood flow to the toes.  Patient has seen his orthopedist as well as the vascular surgeon this week.  They recommended continuing Augmentin for an additional week and have started the patient on nitroglycerin patches to be applied to the dorsum of the foot in an effort to improve blood flow to the distal toes. Past Medical History:  Diagnosis Date  . Atrial flutter ([place])    s/p RFCA  . CAD (coronary artery disease)    cath 2003, occluded S-RCA, occluded S-Dx, L-LAD ok, s/p PTCA to LAD  . Cataract   . CKD (chronic kidney disease) stage 3, GFR 30-59 ml/min (HCC)   . Diabetes mellitus    diet controlled  . Hearing aid worn    bilateral  . Long term (current) use of anticoagulants   . Neuromuscular disorder ([place])   . OSA (obstructive sleep apnea) 12/11   very mild, AHI 7/hr  . Persistent atrial fibrillation ([place]) 09/26/2015  . Pure hyperglyceridemia   . PVD (peripheral vascular disease) ([place])    angioplasty of his right lower extremity in [place] by EricDew 2013  . Unspecified essential hypertension   . Wears dentures    full upper and lower   Past Surgical History:  Procedure Laterality Date  . ABDOMINAL AORTOGRAM W/LOWER EXTREMITY N/A 11/15/2017   Procedure:  ABDOMINAL AORTOGRAM W/LOWER EXTREMITY;  Surgeon: [name], MD;  Location: [facility];  Service: Cardiovascular;  Laterality: N/A;  . Adenosine Myoview  3/06   EF 56%, neg. Ischemia  . Adenosine Myoview  02/18/07   nml  . ANGIOPLASTY  1/99   CAD- diogonal with rotational artherectomy  . Arthrectomy     of LAD & PTCA  . BLEPHAROPLASTY Bilateral   . CARDIAC CATHETERIZATION  1/00  . CARDIOVERSION  1/04  . CARDIOVERSION  5/07   hospital- a flutter  . CARPAL TUNNEL RELEASE     ? bilateral  . CATARACT EXTRACTION W/PHACO Right 07/28/2017   Procedure: CATARACT EXTRACTION PHACO AND INTRAOCULAR LENS PLACEMENT ([place]) RIGHT DIABETIC;  Surgeon: [name], MD;  Location: [facility];  Service: Ophthalmology;  Laterality: Right;  Diabetic - diet controlled  . CATARACT EXTRACTION W/PHACO Left 08/18/2017   Procedure: CATARACT EXTRACTION PHACO AND INTRAOCULAR LENS PLACEMENT ([facility]);  Surgeon: [name], MD;  Location: [facility];  Service: Ophthalmology;  Laterality: Left;  DIABETES - oral meds  . COLONOSCOPY W/ BIOPSIES  10/01/06   sigmoid polyp bx neg, 3 years  . CORONARY ANGIOPLASTY  4/03   cutting balloon PTCA pLAD into Diag  . CORONARY ARTERY BYPASS GRAFT  2000   LIMA-LAD, SVG-RCA, SVG-Diag; SVG-Diag & SVG-RCA occluded 2003  . FRACTURE SURGERY    .  HAND SURGERY  08/27/09   R thumb procedure wit Scaphoid Gragt and screws, Dr [name]  . NM MYOVIEW LTD  4/11   normal  . PERIPHERAL VASCULAR CATHETERIZATION Right 06/27/2015   Procedure: Lower Extremity Angiography;  Surgeon: Algernon [name], MD;  Location: [facility];  Service: Cardiovascular;  Laterality: Right;  . PERIPHERAL VASCULAR CATHETERIZATION  06/27/2015   Procedure: Lower Extremity Intervention;  Surgeon: Algernon [name], MD;  Location: [facility];  Service: Cardiovascular;;   Current Outpatient Medications on File Prior to Visit  Medication Sig Dispense Refill  . acetaminophen  (TYLENOL) 500 MG tablet Take 1,000 mg by mouth every 8 (eight) hours as needed for mild pain.     [name] amLODipine (NORVASC) 10 MG tablet Take 1 tablet (10 mg total) by mouth daily. 90 tablet 3  . amoxicillin-clavulanate (AUGMENTIN) 875-125 MG tablet TAKE 1 TABLET BY MOUTH TWICE DAILY 20 tablet 0  . apixaban (ELIQUIS) 2.5 MG TABS tablet Take 1 tablet (2.5 mg total) by mouth 2 (two) times daily. 180 tablet 1  . B Complex Vitamins (B COMPLEX-B12) TABS Take 1 tablet by mouth daily.     . Blood Glucose Monitoring Suppl (ONE TOUCH ULTRA SYSTEM KIT) W/DEVICE KIT 1 kit by Does not apply route once. 1 each 0  . cholecalciferol (VITAMIN D) 1000 UNITS tablet Take 1,000 Units by mouth 2 (two) times a week. Takes on Mon and Fri    . CINNAMON PO Take by mouth 2 (two) times daily.     . fenofibrate 160 MG tablet Take 1 tablet (160 mg total) by mouth daily. 90 tablet 2  . fish oil-omega-3 fatty acids 1000 MG capsule Take 3 g by mouth daily.     [name] glucose blood test strip Use as instructed 100 each 3  . hydrochlorothiazide (HYDRODIURIL) 25 MG tablet Take 1 tablet (25 mg total) by mouth daily. 90 tablet 3  . lisinopril (PRINIVIL,ZESTRIL) 40 MG tablet Take 1 tablet (40 mg total) by mouth daily. 90 tablet 3  . metoprolol tartrate (LOPRESSOR) 50 MG tablet Take 1 tablet (50 mg total) by mouth 2 (two) times daily. 180 tablet 3  . Multiple Vitamin (DAILY MULTIVITAMIN PO) Take 1 tablet by mouth daily.      . nitroGLYCERIN (NITRODUR - DOSED IN MG/24 HR) 0.2 mg/hr patch Place 1 patch (0.2 mg total) onto the skin daily. 30 patch 12  . nutrition supplement, JUVEN, (JUVEN) PACK Take 1 packet by mouth 2 (two) times daily between meals. 60 packet 0  . ONETOUCH DELICA LANCETS FINE MISC check sugar once daily 100 each 2  . pentoxifylline (TRENTAL) 400 MG CR tablet Take 1 tablet (400 mg total) by mouth 3 (three) times daily with meals. 90 tablet 3  . rosuvastatin (CRESTOR) 40 MG tablet Take 1 tablet (40 mg total) by mouth daily. 90  tablet 2   No current facility-administered medications on file prior to visit.    Allergies  Allergen Reactions  . Isosorbide Mononitrate Other (See Comments)    Unknown, doesn't remember   . Ramipril Cough  . Quinidine Gluconate Rash   Social History   Socioeconomic History  . Marital status: Married    Spouse name: Not on file  . Number of children: 5  . Years of education: Not on file  . Highest education level: Not on file  Occupational History  . Occupation: [employer]    Employer: RETIRED  Social Needs  . Financial  resource strain: Not on file  . Food insecurity:    Worry: Not on file    Inability: Not on file  . Transportation needs:    Medical: Not on file    Non-medical: Not on file  Tobacco Use  . Smoking status: Never Smoker  . Smokeless tobacco: Former Network engineer and Sexual Activity  . Alcohol use: No  . Drug use: No  . Sexual activity: Never  Lifestyle  . Physical activity:    Days per week: Not on file    Minutes per session: Not on file  . Stress: Not on file  Relationships  . Social connections:    Talks on phone: Not on file    Gets together: Not on file    Attends religious service: Not on file    Active member of club or organization: Not on file    Attends meetings of clubs or organizations: Not on file    Relationship status: Not on file  . Intimate partner violence:    Fear of current or ex partner: Not on file    Emotionally abused: Not on file    Physically abused: Not on file    Forced sexual activity: Not on file  Other Topics Concern  . Not on file  Social History Narrative   Retired now does some antiques   Retired from Theatre manager and dye work   Married 1951   5 kids     Review of Systems  All other systems reviewed and are negative.      Objective:   Physical Exam  Cardiovascular: Normal rate and normal heart sounds. An irregularly irregular rhythm present.  No murmur heard. Pulmonary/Chest: Effort normal and  breath sounds normal. No respiratory distress. He has no wheezes. He has no rales.  Musculoskeletal:       Feet:  Skin: There is erythema.  Vitals reviewed.         Assessment & Plan:  Hospital discharge follow-up.  There has been dramatic improvement in the erythema of his foot however there is persistent erythema in the area marked and red.  This is much lighter and appears to be fading suggest possibly resolving infection.  There is black eschar suggesting dry gangrene on the medial aspect of the left great toe is marked in black.  If this area is not healing, the patient may ultimately require amputation of the toe.  He is following up with his vascular surgeon and his orthopedist next week.  I would like him to remain on Augmentin until seen back in follow-up.  I will recheck the patient myself in 1 week to monitor progression of the erythema

## 2017-11-26 ENCOUNTER — Encounter: Payer: Self-pay | Admitting: Family Medicine

## 2017-11-29 ENCOUNTER — Encounter: Payer: Self-pay | Admitting: Family Medicine

## 2017-11-29 DIAGNOSIS — I482 Chronic atrial fibrillation, unspecified: Secondary | ICD-10-CM

## 2017-11-29 MED ORDER — LISINOPRIL 40 MG PO TABS: 40.0000 mg | ORAL_TABLET | Freq: Every day | ORAL | 3 refills | Status: DC

## 2017-11-29 MED ORDER — AMOXICILLIN-POT CLAVULANATE 875-125 MG PO TABS: 1.0000 | ORAL_TABLET | Freq: Two times a day (BID) | ORAL | 0 refills | Status: DC

## 2017-11-29 MED ORDER — METOPROLOL TARTRATE 50 MG PO TABS: 50.0000 mg | ORAL_TABLET | Freq: Two times a day (BID) | ORAL | 3 refills | Status: DC

## 2017-11-30 ENCOUNTER — Ambulatory Visit: Payer: PPO | Admitting: Cardiology

## 2017-12-02 ENCOUNTER — Encounter (INDEPENDENT_AMBULATORY_CARE_PROVIDER_SITE_OTHER): Payer: Self-pay | Admitting: Orthopedic Surgery

## 2017-12-02 ENCOUNTER — Ambulatory Visit (INDEPENDENT_AMBULATORY_CARE_PROVIDER_SITE_OTHER): Payer: PPO | Admitting: Orthopedic Surgery

## 2017-12-02 ENCOUNTER — Encounter: Payer: Self-pay | Admitting: Family Medicine

## 2017-12-02 ENCOUNTER — Ambulatory Visit (INDEPENDENT_AMBULATORY_CARE_PROVIDER_SITE_OTHER): Payer: PPO | Admitting: Family Medicine

## 2017-12-02 VITALS — BP 118/60 | HR 52 | Temp 97.8°F | Resp 18 | Ht 65.0 in | Wt 174.0 lb

## 2017-12-02 DIAGNOSIS — I7025 Atherosclerosis of native arteries of other extremities with ulceration: Secondary | ICD-10-CM | POA: Diagnosis not present

## 2017-12-02 DIAGNOSIS — I96 Gangrene, not elsewhere classified: Secondary | ICD-10-CM

## 2017-12-02 NOTE — Progress Notes (Signed)
Subjective:    Patient ID: Eric [name]., male    DOB: 1928-05-12, 84 y.o.   MRN: [id number]  HPI   Patient was admitted to the hospital.  I have copied relevant portions of the discharge summary below for my reference:  Admit date: 11/10/2017 Discharge date: 11/16/2017  Time spent: 45 minutes  Recommendations for Outpatient Follow-up:  Patient will be discharged to home.  Patient will need to follow up with primary care provider within one week of discharge.  Follow up with [name], vascular surgery. Follow up with [name], orthopedics, in one week. Patient should continue medications as prescribed.  Patient should follow a heart healthy/carb modified diet.   Discharge Diagnoses:  Left lower extremitycellulitisand wound infection/PVD Diabetes mellitus, type II Atrial fibrillation Essential hypertension Chronic kidney disease, stage III Leukocytosis  Discharge Condition: Stable  Diet recommendation: heart healthy/carb modified       Filed Weights   11/10/17 2122 11/13/17 2044 11/16/17 0257  Weight: 78.8 kg (173 lb 11.6 oz) 78.9 kg (174 lb) 80.4 kg (177 lb 4 oz)    History of present illness:  on 11/10/2017 by [name] Eric [name].is a 84 y.o.malewith medical history significant ofHTNl HLD; PVD s/p RLE angioplasty; afib on AC; OSA; DM; stage 3 CKD; and CAD s/p PCI presenting withleftfoot cellulitis. When asked why he came to the hospital, he said, "The doctor said I needed to". He has a sore on his foot and "it's real red, raised, and they're afraid it's gonna go in deep if he don'tget something done about it". It's been there "a while". No fevers. He feels well otherwise.He previously had an infected blister on his right foot for which he was followed by podiatry. He was seen yesterday by [name] for this issue and started on Bactrim and Augmentin. He was seen again today by [name] and sent to the ER due to progressive  infection.  Hospital Course:  Left lower extremitycellulitisand wound infection/PVD -Patient was placed on oral antibiotics, Augmentin and Bactrim prior to admission. He states that his wound has been there for several weeks. Patient was seeing a foot doctor for his right foot previously. -Left foot x-ray showed no acute osseous abnormalities -Obtained MRI without contrast of the left foot: Showing no septic arthritis or osteomyelitis. Cellulitis without discrete drainable soft tissue abscess, mild fasciitis or pyomyositis -Was placed on vancomycin, Flagyl, ceftriaxone.  -CRP 7.1, ESR 78 -blood cultures show no growth to date -ABI R 0.91, L 0.89 (with monophasic waves??) -Cellulitis does appear to be improving -Discussed with [name], patient could still have osteo, but recommended vascular follow up -Vascular surgery consulted and appreciated; patient sees with vasc in [place], [place]-[name] and has intervention on RLE -Vascular surgery recommended angiography-has unreconstructable pedal disease, recommend continued medical management with IV antibiotics and local wound care. No vascular intervention possible for the left lower extremity. -Discussed with [name] and [name]. Will place on oral antibiotics and patient can follow up with [name] (vasc surg) and [name] upon discharge. -Will discharge on Augmentin for additional 9 days  Diabetes mellitus, type II -Currently diet controlled, last hemoglobin A1c was 6 on 09/07/2017  Atrial fibrillation -Continue Eliquis -Continue metoprolol   Essential hypertension -Continue metoprolol, amlodipine -Lisinopril and HCTZ currently held- may resume on discharge  Chronic kidney disease, stage III -Creatinine currently within stage III- creatinine 1.10 -Continue to monitor BMP  Leukocytosis -currently on antibiotics -Resolved -patient  afebrile, with no complaints of urinary issues or upper respiratory issues -blood  cultures shows no growth to date  Consultants Orthopedic surgery Vascular surgery  Procedures  ABI Abdominal aortogram with bilateral lower extremity runoff third order catheterization left superficial femoral artery   Patient is here today for follow-up.  The erythema in his left leg has improved dramatically after antibiotics.  There is still residual erythema on the distal third of his midfoot however there is no warmth or pain.  There is black eschar on the medial aspect of the left great toe.  There is also desquamation around the first MTP joint with callus formation on the plantar aspect of the first MTP joint.  Patient has palpable pedal pulses however his issues have been distal to the MTP joints due to inadequate blood flow to the toes.  Patient has seen his orthopedist as well as the vascular surgeon this week.  They recommended continuing Augmentin for an additional week and have started the patient on nitroglycerin patches to be applied to the dorsum of the foot in an effort to improve blood flow to the distal toes. At that time, my plan was: Hospital discharge follow-up.  There has been dramatic improvement in the erythema of his foot however there is persistent erythema in the area marked and red.  This is much lighter and appears to be fading suggest possibly resolving infection.  There is black eschar suggesting dry gangrene on the medial aspect of the left great toe is marked in black.  If this area is not healing, the patient may ultimately require amputation of the toe.  He is following up with his vascular surgeon and his orthopedist next week.  I would like him to remain on Augmentin until seen back in follow-up.  I will recheck the patient myself in 1 week to monitor progression of the erythema  12/02/17 The improvement has stalled.  Despite continuing to remain on Augmentin, there is persistent erythema distal to the MTP joint.  Patient continues to have an area of black  necrotic eschar and dry gangrene.  However there is now erythema spreading onto the surface of the toe around the dry gangrene.  The skin is becoming white and macerated.  Skin is gradually peeling away all over the toe distal to the MTP joint.  There is also now breakdown of the skin on the plantar and lateral surface of the MTP joint with ulceration. Past Medical History:  Diagnosis Date  . Atrial flutter ([place])    s/p RFCA  . CAD (coronary artery disease)    cath 2003, occluded S-RCA, occluded S-Dx, L-LAD ok, s/p PTCA to LAD  . Cataract   . CKD (chronic kidney disease) stage 3, GFR 30-59 ml/min (HCC)   . Diabetes mellitus    diet controlled  . Hearing aid worn    bilateral  . Long term (current) use of anticoagulants   . Neuromuscular disorder ([place])   . OSA (obstructive sleep apnea) 12/11   very mild, AHI 7/hr  . Persistent atrial fibrillation ([place]) 09/26/2015  . Pure hyperglyceridemia   . PVD (peripheral vascular disease) ([place])    angioplasty of his right lower extremity in [place] by Dr.[name] 2013  . Unspecified essential hypertension   . Wears dentures    full upper and lower   Past Surgical History:  Procedure Laterality Date  . ABDOMINAL AORTOGRAM W/LOWER EXTREMITY N/A 11/15/2017   Procedure: ABDOMINAL AORTOGRAM W/LOWER EXTREMITY;  Surgeon: [name], MD;  Location: [facility];  Service: Cardiovascular;  Laterality: N/A;  . Adenosine Myoview  3/06   EF 56%, neg. Ischemia  . Adenosine Myoview  02/18/07   nml  . ANGIOPLASTY  1/99   CAD- diogonal with rotational artherectomy  . Arthrectomy     of LAD & PTCA  . BLEPHAROPLASTY Bilateral   . CARDIAC CATHETERIZATION  1/00  . CARDIOVERSION  1/04  . CARDIOVERSION  5/07   hospital- a flutter  . CARPAL TUNNEL RELEASE     ? bilateral  . CATARACT EXTRACTION W/PHACO Right 07/28/2017   Procedure: CATARACT EXTRACTION PHACO AND INTRAOCULAR LENS PLACEMENT ([place]) RIGHT DIABETIC;  Surgeon: [name], MD;   Location: [facility];  Service: Ophthalmology;  Laterality: Right;  Diabetic - diet controlled  . CATARACT EXTRACTION W/PHACO Left 08/18/2017   Procedure: CATARACT EXTRACTION PHACO AND INTRAOCULAR LENS PLACEMENT ([facility]);  Surgeon: [name], MD;  Location: [facility];  Service: Ophthalmology;  Laterality: Left;  DIABETES - oral meds  . COLONOSCOPY W/ BIOPSIES  10/01/06   sigmoid polyp bx neg, 3 years  . CORONARY ANGIOPLASTY  4/03   cutting balloon PTCA pLAD into Diag  . CORONARY ARTERY BYPASS GRAFT  2000   LIMA-LAD, SVG-RCA, SVG-Diag; SVG-Diag & SVG-RCA occluded 2003  . FRACTURE SURGERY    . HAND SURGERY  08/27/09   R thumb procedure wit Scaphoid Gragt and screws, Dr [name]  . NM MYOVIEW LTD  4/11   normal  . PERIPHERAL VASCULAR CATHETERIZATION Right 06/27/2015   Procedure: Lower Extremity Angiography;  Surgeon: Algernon [name], MD;  Location: [facility];  Service: Cardiovascular;  Laterality: Right;  . PERIPHERAL VASCULAR CATHETERIZATION  06/27/2015   Procedure: Lower Extremity Intervention;  Surgeon: Algernon [name], MD;  Location: [facility];  Service: Cardiovascular;;   Current Outpatient Medications on File Prior to Visit  Medication Sig Dispense Refill  . acetaminophen (TYLENOL) 500 MG tablet Take 1,000 mg by mouth every 8 (eight) hours as needed for mild pain.     [name] amLODipine (NORVASC) 10 MG tablet Take 1 tablet (10 mg total) by mouth daily. 90 tablet 3  . amoxicillin-clavulanate (AUGMENTIN) 875-125 MG tablet Take 1 tablet by mouth 2 (two) times daily. 20 tablet 0  . apixaban (ELIQUIS) 2.5 MG TABS tablet Take 1 tablet (2.5 mg total) by mouth 2 (two) times daily. 180 tablet 1  . B Complex Vitamins (B COMPLEX-B12) TABS Take 1 tablet by mouth daily.     . Blood Glucose Monitoring Suppl (ONE TOUCH ULTRA SYSTEM KIT) W/DEVICE KIT 1 kit by Does not apply route once. 1 each 0  . cholecalciferol (VITAMIN D) 1000 UNITS tablet Take 1,000 Units by mouth 2  (two) times a week. Takes on Mon and Fri    . CINNAMON PO Take by mouth 2 (two) times daily.     . fenofibrate 160 MG tablet Take 1 tablet (160 mg total) by mouth daily. 90 tablet 2  . fish oil-omega-3 fatty acids 1000 MG capsule Take 3 g by mouth daily.     [name] glucose blood test strip Use as instructed 100 each 3  . hydrochlorothiazide (HYDRODIURIL) 25 MG tablet Take 1 tablet (25 mg total) by mouth daily. 90 tablet 3  . lisinopril (PRINIVIL,ZESTRIL) 40 MG tablet Take 1 tablet (40 mg total) by mouth daily. 90 tablet 3  . metoprolol tartrate (LOPRESSOR) 50 MG tablet Take 1 tablet (50 mg total) by mouth 2 (two) times daily.  180 tablet 3  . Multiple Vitamin (DAILY MULTIVITAMIN PO) Take 1 tablet by mouth daily.      . nitroGLYCERIN (NITRODUR - DOSED IN MG/24 HR) 0.2 mg/hr patch Place 1 patch (0.2 mg total) onto the skin daily. 30 patch 12  . nutrition supplement, JUVEN, (JUVEN) PACK Take 1 packet by mouth 2 (two) times daily between meals. 60 packet 0  . ONETOUCH DELICA LANCETS FINE MISC check sugar once daily 100 each 2  . pentoxifylline (TRENTAL) 400 MG CR tablet Take 1 tablet (400 mg total) by mouth 3 (three) times daily with meals. 90 tablet 3  . rosuvastatin (CRESTOR) 40 MG tablet Take 1 tablet (40 mg total) by mouth daily. 90 tablet 2   No current facility-administered medications on file prior to visit.    Allergies  Allergen Reactions  . Isosorbide Mononitrate Other (See Comments)    Unknown, doesn't remember   . Ramipril Cough  . Quinidine Gluconate Rash   Social History   Socioeconomic History  . Marital status: Married    Spouse name: Not on file  . Number of children: 5  . Years of education: Not on file  . Highest education level: Not on file  Occupational History  . Occupation: [employer]    Employer: RETIRED  Social Needs  . Financial resource strain: Not on file  . Food insecurity:    Worry: Not on file    Inability: Not on file  . Transportation needs:     Medical: Not on file    Non-medical: Not on file  Tobacco Use  . Smoking status: Never Smoker  . Smokeless tobacco: Former Network engineer and Sexual Activity  . Alcohol use: No  . Drug use: No  . Sexual activity: Never  Lifestyle  . Physical activity:    Days per week: Not on file    Minutes per session: Not on file  . Stress: Not on file  Relationships  . Social connections:    Talks on phone: Not on file    Gets together: Not on file    Attends religious service: Not on file    Active member of club or organization: Not on file    Attends meetings of clubs or organizations: Not on file    Relationship status: Not on file  . Intimate partner violence:    Fear of current or ex partner: Not on file    Emotionally abused: Not on file    Physically abused: Not on file    Forced sexual activity: Not on file  Other Topics Concern  . Not on file  Social History Narrative   Retired now does some antiques   Retired from Theatre manager and dye work   Married 1951   5 kids     Review of Systems  All other systems reviewed and are negative.      Objective:   Physical Exam  Cardiovascular: Normal rate and normal heart sounds. An irregularly irregular rhythm present.  No murmur heard. Pulmonary/Chest: Effort normal and breath sounds normal. No respiratory distress. He has no wheezes. He has no rales.  Musculoskeletal:       Feet:  Skin: There is erythema.  Vitals reviewed.         Assessment & Plan:  Dry gangrene ([place])  I believe that the tissue distal to the MTP joint is not viable due to vascular insufficiency.  I believe that there may be an element of osteomyelitis in  addition to dry gangrene.  Despite antibiotics and dressing changes and topical nitroglycerin, the situation distal to the MTP joint appears to be worsening.  Patient has an appointment today with his orthopedist.  Unfortunately it appears as though he needs to have the toe removed distal to the MTP joint.   Continue Augmentin at the present time and await the results of the orthopedic consultation this afternoon

## 2017-12-02 NOTE — Progress Notes (Signed)
Office Visit Note   Patient: Eric [name].           Date of Birth: 06-Jun-1928           MRN: [id number] Visit Date: 12/02/2017              Requested by: [name], MD [address] [place], [place] [address] PCP: [name], MD  Chief Complaint  Patient presents with  . Left Foot - Edema, Pain      HPI: Patient is an 84 year old gentleman with peripheral vascular disease left lower extremity.  Patient was consulted with vascular vein surgery arteriogram showed a microcirculatory disease involving the foot he does not have revascularization options.  Patient has been using Trileptal to improve circulation nitroglycerin patch and has increasing pain and increasing ischemic changes to the great toe.  Assessment & Plan: Visit Diagnoses:  1. Atherosclerosis of native arteries of the extremities with ulceration ([place])     Plan: Discussed the patient with the microcirculatory disease of the foot I do not feel that a first ray amputation would heal.  Feel that his best option would be a transtibial amputation.  Patient states that with his increased pain he cannot continue like this.  I have discussed with his family recommendations for below-knee amputation with discharge to skilled nursing.  Patient states he would like to have some time to think about this and he will call us back and anticipate surgery next week.  Follow-Up Instructions: Return in about 2 weeks (around 12/16/2017).   Ortho Exam  Patient is alert, oriented, no adenopathy, well-dressed, normal affect, normal respiratory effort. Examination patient has a dampened monophasic dorsalis pedis and posterior tibial pulse.  He has redness around the first metatarsal head with gangrenous changes to the great toe.  There is no purulent drainage there is no abscess there is no ascending cellulitis he does not have palpable pulses.  Imaging: No results found. No images are attached to the  encounter.  Labs: Lab Results  Component Value Date   HGBA1C 6.0 (H) 09/07/2017   HGBA1C 5.9 (H) 03/01/2017   HGBA1C 6.2 (H) 08/13/2016   ESRSEDRATE 78 (H) 11/10/2017   CRP 7.1 (H) 11/10/2017   REPTSTATUS 11/15/2017 FINAL 11/10/2017   REPTSTATUS 11/15/2017 FINAL 11/10/2017   CULT  11/10/2017    NO GROWTH 5 DAYS Performed at [facility], [place] [address], [place], [place] [address]    CULT  11/10/2017    NO GROWTH 5 DAYS Performed at [facility] [address] [place], [place] [address]     @LABSALLVALUES (HGBA1)@  There is no height or weight on file to calculate BMI.  Orders:  No orders of the defined types were placed in this encounter.  No orders of the defined types were placed in this encounter.    Procedures: No procedures performed  Clinical Data: No additional findings.  ROS:  All other systems negative, except as noted in the HPI. Review of Systems  Objective: Vital Signs: There were no vitals taken for this visit.  Specialty Comments:  No specialty comments available.  PMFS History: Patient Active Problem List   Diagnosis Date Noted  . Atherosclerosis of native arteries of the extremities with ulceration ([place]) 11/23/2017  . Cellulitis 11/10/2017  . Chronic anticoagulation 09/26/2015  . Persistent atrial fibrillation ([place]) 09/26/2015  . Olecranon bursitis 04/23/2014  . Preop cardiovascular exam 04/13/2013  . Routine general medical examination at  a health care facility 10/10/2012  . PVD (peripheral vascular disease) ([place]) 06/02/2012  . Diabetes mellitus type 2 with peripheral artery disease ([place]) 05/17/2012  . Microhematuria 11/16/2011  . BPH (benign prostatic hyperplasia) 11/16/2011  . Chronic kidney disease, stage III (moderate) ([place]) 08/26/2011  . Leg pain 08/26/2011  . Coronary artery disease 03/25/2011  . OBSTRUCTIVE SLEEP APNEA 09/15/2010  . PERIODIC LIMB MOVEMENT DISORDER 09/15/2010  . SLEEP APNEA 08/26/2010  . PERSONAL  HISTORY OF COLONIC POLYPS 10/25/2009  . VITAMIN B12 DEFICIENCY 03/04/2009  . Diabetes mellitus with neuropathy ([place]) 11/24/2006  . HYPERTRIGLYCERIDEMIA 11/24/2006  . Essential hypertension 11/24/2006   Past Medical History:  Diagnosis Date  . Atrial flutter ([place])    s/p RFCA  . CAD (coronary artery disease)    cath 2003, occluded S-RCA, occluded S-Dx, L-LAD ok, s/p PTCA to LAD  . Cataract   . CKD (chronic kidney disease) stage 3, GFR 30-59 ml/min (HCC)   . Diabetes mellitus    diet controlled  . Hearing aid worn    bilateral  . Long term (current) use of anticoagulants   . Neuromuscular disorder ([place])   . OSA (obstructive sleep apnea) 12/11   very mild, AHI 7/hr  . Persistent atrial fibrillation ([place]) 09/26/2015  . Pure hyperglyceridemia   . PVD (peripheral vascular disease) ([place])    angioplasty of his right lower extremity in [place] by Dr.[name] 2013  . Unspecified essential hypertension   . Wears dentures    full upper and lower    Family History  Problem Relation Age of Onset  . Stroke Father   . Aneurysm Father   . Hypertension Father   . Prostate cancer Brother   . Hypertension Mother   . Lung cancer Brother        smoker  . Thrombosis Brother   . Other Brother        RF valve disorder (smoker)  . Lung cancer Brother        smoker  . Breast cancer Sister   . Liver cancer Brother   . Other Sister        cerebral hemorrhage    Past Surgical History:  Procedure Laterality Date  . ABDOMINAL AORTOGRAM W/LOWER EXTREMITY N/A 11/15/2017   Procedure: ABDOMINAL AORTOGRAM W/LOWER EXTREMITY;  Surgeon: [name], MD;  Location: [facility];  Service: Cardiovascular;  Laterality: N/A;  . Adenosine Myoview  3/06   EF 56%, neg. Ischemia  . Adenosine Myoview  02/18/07   nml  . ANGIOPLASTY  1/99   CAD- diogonal with rotational artherectomy  . Arthrectomy     of LAD & PTCA  . BLEPHAROPLASTY Bilateral   . CARDIAC CATHETERIZATION  1/00  . CARDIOVERSION  1/04   . CARDIOVERSION  5/07   hospital- a flutter  . CARPAL TUNNEL RELEASE     ? bilateral  . CATARACT EXTRACTION W/PHACO Right 07/28/2017   Procedure: CATARACT EXTRACTION PHACO AND INTRAOCULAR LENS PLACEMENT ([place]) RIGHT DIABETIC;  Surgeon: [name], MD;  Location: [facility];  Service: Ophthalmology;  Laterality: Right;  Diabetic - diet controlled  . CATARACT EXTRACTION W/PHACO Left 08/18/2017   Procedure: CATARACT EXTRACTION PHACO AND INTRAOCULAR LENS PLACEMENT ([facility]);  Surgeon: [name], MD;  Location: [facility];  Service: Ophthalmology;  Laterality: Left;  DIABETES - oral meds  . COLONOSCOPY W/ BIOPSIES  10/01/06   sigmoid polyp bx neg, 3 years  . CORONARY ANGIOPLASTY  4/03   cutting balloon PTCA pLAD into  Diag  . CORONARY ARTERY BYPASS GRAFT  2000   LIMA-LAD, SVG-RCA, SVG-Diag; SVG-Diag & SVG-RCA occluded 2003  . FRACTURE SURGERY    . HAND SURGERY  08/27/09   R thumb procedure wit Scaphoid Gragt and screws, Dr [name]  . NM MYOVIEW LTD  4/11   normal  . PERIPHERAL VASCULAR CATHETERIZATION Right 06/27/2015   Procedure: Lower Extremity Angiography;  Surgeon: Algernon [name], MD;  Location: [facility];  Service: Cardiovascular;  Laterality: Right;  . PERIPHERAL VASCULAR CATHETERIZATION  06/27/2015   Procedure: Lower Extremity Intervention;  Surgeon: Algernon [name], MD;  Location: [facility];  Service: Cardiovascular;;   Social History   Occupational History  . Occupation: [employer]    Employer: RETIRED  Tobacco Use  . Smoking status: Never Smoker  . Smokeless tobacco: Former Network engineer and Sexual Activity  . Alcohol use: No  . Drug use: No  . Sexual activity: Never

## 2017-12-07 ENCOUNTER — Encounter (INDEPENDENT_AMBULATORY_CARE_PROVIDER_SITE_OTHER): Payer: Self-pay | Admitting: Vascular Surgery

## 2017-12-07 ENCOUNTER — Telehealth (INDEPENDENT_AMBULATORY_CARE_PROVIDER_SITE_OTHER): Payer: Self-pay | Admitting: Vascular Surgery

## 2017-12-07 ENCOUNTER — Ambulatory Visit (INDEPENDENT_AMBULATORY_CARE_PROVIDER_SITE_OTHER): Payer: PPO | Admitting: Vascular Surgery

## 2017-12-07 VITALS — BP 105/63 | HR 46 | Resp 16 | Ht 67.0 in | Wt 172.0 lb

## 2017-12-07 DIAGNOSIS — I7025 Atherosclerosis of native arteries of other extremities with ulceration: Secondary | ICD-10-CM

## 2017-12-07 DIAGNOSIS — I739 Peripheral vascular disease, unspecified: Secondary | ICD-10-CM | POA: Diagnosis not present

## 2017-12-07 DIAGNOSIS — M79605 Pain in left leg: Secondary | ICD-10-CM

## 2017-12-07 NOTE — Progress Notes (Signed)
Subjective:    Patient ID: Eric [name]., male    DOB: 1927-09-25, 84 y.o.   MRN: [id number] Chief Complaint  Patient presents with  . Follow-up    2 week follow up of toe   Patient presents for a 2-week wound follow-up.  The patient was seen by an orthopedic surgeon from [facility] [name] who recommends left below the knee amputation.  The patient was seen with his wife and son.  The patient's toe ulceration is not improving.  The surrounding erythema continues to track further up the foot now ending at the patient's midfoot.  The area of necrosis to the lateral aspect of the big toe is stable however there are two new areas of necrosis to the plantar aspect of the big toe.  It continues to be dry gangrene.  We had a long conversation with [name], the patient, his wife, his son and me present in the room discussing the patient's options.  The patient has the option of a left transmetatarsal amputation.  We discussed how this amputation may not be successful due to the low probability that he will be able to heal the amputation site.  [name] recommends an above-the-knee amputation as this would offer the patient the best chance at healing his amputation site and eventually walking with a prosthetic.  We do not recommend a toe amputation at this point due to the necrosis/ischemia progressing.  He understands that if he chooses this option that he is at risk for both sepsis and osteomyelitis.  The patient does note his pain progressively worsens.  The patient at this point denies any fever, nausea or vomiting.  Review of Systems  Constitutional: Negative.   HENT: Negative.   Eyes: Negative.   Respiratory: Negative.   Cardiovascular:       PAD  Gastrointestinal: Negative.   Endocrine: Negative.   Genitourinary: Negative.   Musculoskeletal: Negative.   Skin: Positive for wound.  Allergic/Immunologic: Negative.   Neurological: Negative.   Hematological: Negative.     Psychiatric/Behavioral: Negative.       Objective:   Physical Exam  Constitutional: He is oriented to person, place, and time. He appears well-developed and well-nourished. No distress.  HENT:  Head: Normocephalic and atraumatic.  Right Ear: External ear normal.  Left Ear: External ear normal.  Eyes: Pupils are equal, round, and reactive to light. EOM are normal.  Neck: Normal range of motion.  Cardiovascular: Normal rate, regular rhythm, normal heart sounds and intact distal pulses.  Pulses:      Radial pulses are 2+ on the right side, and 2+ on the left side.  Hard to palpate pedal pulses to the bilateral feet  Pulmonary/Chest: Effort normal and breath sounds normal.  Musculoskeletal: Normal range of motion. He exhibits no edema.  Neurological: He is alert and oriented to person, place, and time.  Skin: He is not diaphoretic.  Left foot: Erythema is now tracking to approximately mid midfoot.  Area of necrotic tissue to the lateral aspect of the left first toe is stable.  There are 2 new areas of necrotic tissue to the plantar aspect of the left first toe.  He continues to be dry gangrene.  Psychiatric: He has a normal mood and affect. His behavior is normal. Judgment and thought content normal.  Vitals reviewed.  BP 105/63 (BP Location: Right Arm, Patient Position: Sitting)   Pulse (!) 46   Resp 16   Ht _0  (1.702 m)  Wt 172 lb (78 kg)   BMI 26.94 kg/m   Past Medical History:  Diagnosis Date  . Atrial flutter ([place])    s/p RFCA  . CAD (coronary artery disease)    cath 2003, occluded S-RCA, occluded S-Dx, L-LAD ok, s/p PTCA to LAD  . Cataract   . CKD (chronic kidney disease) stage 3, GFR 30-59 ml/min (HCC)   . Diabetes mellitus    diet controlled  . Hearing aid worn    bilateral  . Long term (current) use of anticoagulants   . Neuromuscular disorder ([place])   . OSA (obstructive sleep apnea) 12/11   very mild, AHI 7/hr  . Persistent atrial fibrillation ([place])  09/26/2015  . Pure hyperglyceridemia   . PVD (peripheral vascular disease) ([place])    angioplasty of his right lower extremity in [place] by Dr.[name] 2013  . Unspecified essential hypertension   . Wears dentures    full upper and lower   Social History   Socioeconomic History  . Marital status: Married    Spouse name: Not on file  . Number of children: 5  . Years of education: Not on file  . Highest education level: Not on file  Occupational History  . Occupation: [employer]    Employer: RETIRED  Social Needs  . Financial resource strain: Not on file  . Food insecurity:    Worry: Not on file    Inability: Not on file  . Transportation needs:    Medical: Not on file    Non-medical: Not on file  Tobacco Use  . Smoking status: Never Smoker  . Smokeless tobacco: Former Network engineer and Sexual Activity  . Alcohol use: No  . Drug use: No  . Sexual activity: Never  Lifestyle  . Physical activity:    Days per week: Not on file    Minutes per session: Not on file  . Stress: Not on file  Relationships  . Social connections:    Talks on phone: Not on file    Gets together: Not on file    Attends religious service: Not on file    Active member of club or organization: Not on file    Attends meetings of clubs or organizations: Not on file    Relationship status: Not on file  . Intimate partner violence:    Fear of current or ex partner: Not on file    Emotionally abused: Not on file    Physically abused: Not on file    Forced sexual activity: Not on file  Other Topics Concern  . Not on file  Social History Narrative   Retired now does some antiques   Retired from Theatre manager and dye work   Married 1951   5 kids   Past Surgical History:  Procedure Laterality Date  . ABDOMINAL AORTOGRAM W/LOWER EXTREMITY N/A 11/15/2017   Procedure: ABDOMINAL AORTOGRAM W/LOWER EXTREMITY;  Surgeon: [name], MD;  Location: [place] [facility];  Service: Cardiovascular;  Laterality:  N/A;  . Adenosine Myoview  3/06   EF 56%, neg. Ischemia  . Adenosine Myoview  02/18/07   nml  . ANGIOPLASTY  1/99   CAD- diogonal with rotational artherectomy  . Arthrectomy     of LAD & PTCA  . BLEPHAROPLASTY Bilateral   . CARDIAC CATHETERIZATION  1/00  . CARDIOVERSION  1/04  . CARDIOVERSION  5/07   hospital- a flutter  . CARPAL TUNNEL RELEASE     ? bilateral  .  CATARACT EXTRACTION W/PHACO Right 07/28/2017   Procedure: CATARACT EXTRACTION PHACO AND INTRAOCULAR LENS PLACEMENT ([place]) RIGHT DIABETIC;  Surgeon: [name], MD;  Location: [place];  Service: Ophthalmology;  Laterality: Right;  Diabetic - diet controlled  . CATARACT EXTRACTION W/PHACO Left 08/18/2017   Procedure: CATARACT EXTRACTION PHACO AND INTRAOCULAR LENS PLACEMENT ([facility]);  Surgeon: [name], MD;  Location: [facility];  Service: Ophthalmology;  Laterality: Left;  DIABETES - oral meds  . COLONOSCOPY W/ BIOPSIES  10/01/06   sigmoid polyp bx neg, 3 years  . CORONARY ANGIOPLASTY  4/03   cutting balloon PTCA pLAD into Diag  . CORONARY ARTERY BYPASS GRAFT  2000   LIMA-LAD, SVG-RCA, SVG-Diag; SVG-Diag & SVG-RCA occluded 2003  . FRACTURE SURGERY    . HAND SURGERY  08/27/09   R thumb procedure wit Scaphoid Gragt and screws, Dr [name]  . NM MYOVIEW LTD  4/11   normal  . PERIPHERAL VASCULAR CATHETERIZATION Right 06/27/2015   Procedure: Lower Extremity Angiography;  Surgeon: Algernon [name], MD;  Location: [facility];  Service: Cardiovascular;  Laterality: Right;  . PERIPHERAL VASCULAR CATHETERIZATION  06/27/2015   Procedure: Lower Extremity Intervention;  Surgeon: Algernon [name], MD;  Location: [place];  Service: Cardiovascular;;   Family History  Problem Relation Age of Onset  . Stroke Father   . Aneurysm Father   . Hypertension Father   . Prostate cancer Brother   . Hypertension Mother   . Lung cancer Brother        smoker  . Thrombosis Brother   . Other Brother         RF valve disorder (smoker)  . Lung cancer Brother        smoker  . Breast cancer Sister   . Liver cancer Brother   . Other Sister        cerebral hemorrhage   Allergies  Allergen Reactions  . Isosorbide Mononitrate Other (See Comments)    Unknown, doesn't remember   . Ramipril Cough  . Quinidine Gluconate Rash      Assessment & Plan:  Patient presents for a 2-week wound follow-up.  The patient was seen by an orthopedic surgeon from [facility] [name] who recommends left below the knee amputation.  The patient was seen with his wife and son.  The patient's toe ulceration is not improving.  The surrounding erythema continues to track further up the foot now ending at the patient's midfoot.  The area of necrosis to the lateral aspect of the big toe is stable however there are two new areas of necrosis to the plantar aspect of the big toe.  It continues to be dry gangrene.  We had a long conversation with [name], the patient, his wife, his son and me present in the room discussing the patient's options.  The patient has the option of a left transmetatarsal amputation.  We discussed how this amputation may not be successful due to the low probability that he will be able to heal the amputation site.  [name] recommends an above-the-knee amputation as this would offer the patient the best chance at healing his amputation site and eventually walking with a prosthetic.  We do not recommend a toe amputation at this point due to the necrosis/ischemia progressing.  He understands that if he chooses this option that he is at risk for both sepsis and osteomyelitis.  The patient does note his pain progressively worsens.  The patient at  this point denies any fever, nausea or vomiting.  1. PVD (peripheral vascular disease) ([place]) - Stable Patient with micro vascular disease to the left foot. Relatively normal ABIs with dampened great toe waveforms. Unfortunately we are unable to offer any endovascular  intervention for microvascular disease to the toes.  2. Atherosclerosis of native arteries of the extremities with ulceration ([place]) - Stable Due to the progressive nature of the patient's left foot ischemia.  We do not recommend transmetatarsal amputation or auto amputation.  Recommends a left below the knee amputation.  This will give the patient the best option to heal and walk with the prosthesis moving forward The patient is not ready to make a decision at this time.  I have written down all of this information and given it to the patient.  Once the patient makes his decision he should call the office and if he would like [name] to do his amputation we will place him on the OR schedule.  3. Pain of left lower extremity - Stable Due to the progressive nature of the patient's left foot ischemia  Current Outpatient Medications on File Prior to Visit  Medication Sig Dispense Refill  . acetaminophen (TYLENOL) 500 MG tablet Take 1,000 mg by mouth every 8 (eight) hours as needed for mild pain.     [name] amLODipine (NORVASC) 10 MG tablet Take 1 tablet (10 mg total) by mouth daily. 90 tablet 3  . amoxicillin-clavulanate (AUGMENTIN) 875-125 MG tablet Take 1 tablet by mouth 2 (two) times daily. 20 tablet 0  . apixaban (ELIQUIS) 2.5 MG TABS tablet Take 1 tablet (2.5 mg total) by mouth 2 (two) times daily. 180 tablet 1  . B Complex Vitamins (B COMPLEX-B12) TABS Take 1 tablet by mouth daily.     . Blood Glucose Monitoring Suppl (ONE TOUCH ULTRA SYSTEM KIT) W/DEVICE KIT 1 kit by Does not apply route once. 1 each 0  . cholecalciferol (VITAMIN D) 1000 UNITS tablet Take 1,000 Units by mouth 2 (two) times a week. Takes on Mon and Fri    . CINNAMON PO Take by mouth 2 (two) times daily.     . fenofibrate 160 MG tablet Take 1 tablet (160 mg total) by mouth daily. 90 tablet 2  . fish oil-omega-3 fatty acids 1000 MG capsule Take 3 g by mouth daily.     [name] glucose blood test strip Use as instructed 100 each 3  .  hydrochlorothiazide (HYDRODIURIL) 25 MG tablet Take 1 tablet (25 mg total) by mouth daily. 90 tablet 3  . lisinopril (PRINIVIL,ZESTRIL) 40 MG tablet Take 1 tablet (40 mg total) by mouth daily. 90 tablet 3  . metoprolol tartrate (LOPRESSOR) 50 MG tablet Take 1 tablet (50 mg total) by mouth 2 (two) times daily. 180 tablet 3  . Multiple Vitamin (DAILY MULTIVITAMIN PO) Take 1 tablet by mouth daily.      . nitroGLYCERIN (NITRODUR - DOSED IN MG/24 HR) 0.2 mg/hr patch Place 1 patch (0.2 mg total) onto the skin daily. 30 patch 12  . nutrition supplement, JUVEN, (JUVEN) PACK Take 1 packet by mouth 2 (two) times daily between meals. 60 packet 0  . ONETOUCH DELICA LANCETS FINE MISC check sugar once daily 100 each 2  . pentoxifylline (TRENTAL) 400 MG CR tablet Take 1 tablet (400 mg total) by mouth 3 (three) times daily with meals. 90 tablet 3  . rosuvastatin (CRESTOR) 40 MG tablet Take 1 tablet (40 mg total) by mouth daily. 90 tablet 2  No current facility-administered medications on file prior to visit.    There are no Patient Instructions on file for this visit. No follow-ups on file.  Xaviera Flaten A Alma Muegge, PA-C

## 2017-12-07 NOTE — Telephone Encounter (Signed)
As per the request of the patient and his family I placed a call to Eric [name] [phone].  The patient and his family wanted me to update Eric [name] as to his condition and what his options were as we discussed today during his visit.  Eric [name] did not answer the phone so I left a message asking her to please return my phone call at her convenience.

## 2017-12-08 ENCOUNTER — Telehealth (INDEPENDENT_AMBULATORY_CARE_PROVIDER_SITE_OTHER): Payer: Self-pay | Admitting: Vascular Surgery

## 2017-12-08 NOTE — Telephone Encounter (Signed)
I have called the patient back and spoken to his daughter-in-law.

## 2017-12-09 ENCOUNTER — Encounter (INDEPENDENT_AMBULATORY_CARE_PROVIDER_SITE_OTHER): Payer: Self-pay

## 2017-12-09 ENCOUNTER — Other Ambulatory Visit (INDEPENDENT_AMBULATORY_CARE_PROVIDER_SITE_OTHER): Payer: Self-pay | Admitting: Vascular Surgery

## 2017-12-10 ENCOUNTER — Ambulatory Visit (INDEPENDENT_AMBULATORY_CARE_PROVIDER_SITE_OTHER): Payer: PPO | Admitting: Family Medicine

## 2017-12-10 ENCOUNTER — Encounter: Payer: Self-pay | Admitting: Family Medicine

## 2017-12-10 VITALS — BP 120/50 | HR 54 | Temp 97.4°F | Resp 18 | Ht 65.0 in | Wt 167.0 lb

## 2017-12-10 DIAGNOSIS — I96 Gangrene, not elsewhere classified: Secondary | ICD-10-CM | POA: Diagnosis not present

## 2017-12-10 DIAGNOSIS — R112 Nausea with vomiting, unspecified: Secondary | ICD-10-CM | POA: Diagnosis not present

## 2017-12-10 LAB — COMPLETE METABOLIC PANEL WITH GFR
AG Ratio: 1.3 (calc) (ref 1.0–2.5)
ALT: 16 U/L (ref 9–46)
AST: 29 U/L (ref 10–35)
Albumin: 3.7 g/dL (ref 3.6–5.1)
Alkaline phosphatase (APISO): 39 U/L — ABNORMAL LOW (ref 40–115)
BUN/Creatinine Ratio: 20 (calc) (ref 6–22)
BUN: 29 mg/dL — AB (ref 7–25)
CO2: 26 mmol/L (ref 20–32)
CREATININE: 1.44 mg/dL — AB (ref 0.70–1.11)
Calcium: 9.8 mg/dL (ref 8.6–10.3)
Chloride: 99 mmol/L (ref 98–110)
GFR, EST NON AFRICAN AMERICAN: 43 mL/min/{1.73_m2} — AB (ref >=60)
GFR, Est African American: 50 mL/min/{1.73_m2} — ABNORMAL LOW (ref >=60)
Globulin: 2.9 g/dL (calc) (ref 1.9–3.7)
Glucose, Bld: 128 mg/dL — ABNORMAL HIGH (ref 65–99)
POTASSIUM: 4.2 mmol/L (ref 3.5–5.3)
Sodium: 131 mmol/L — ABNORMAL LOW (ref 135–146)
Total Bilirubin: 0.6 mg/dL (ref 0.2–1.2)
Total Protein: 6.6 g/dL (ref 6.1–8.1)

## 2017-12-10 NOTE — Progress Notes (Signed)
Subjective:    Patient ID: Eric [name]., male    DOB: 1927/09/06, 84 y.o.   MRN: [id number]  HPI  Patient was admitted to the hospital.  I have copied relevant portions of the discharge summary below for my reference:  Admit date: 11/10/2017 Discharge date: 11/16/2017  Time spent: 45 minutes  Recommendations for Outpatient Follow-up:  Patient will be discharged to home.  Patient will need to follow up with primary care provider within one week of discharge.  Follow up with [name], vascular surgery. Follow up with [name], orthopedics, in one week. Patient should continue medications as prescribed.  Patient should follow a heart healthy/carb modified diet.   Discharge Diagnoses:  Left lower extremitycellulitisand wound infection/PVD Diabetes mellitus, type II Atrial fibrillation Essential hypertension Chronic kidney disease, stage III Leukocytosis  Discharge Condition: Stable  Diet recommendation: heart healthy/carb modified       Filed Weights   11/10/17 2122 11/13/17 2044 11/16/17 0257  Weight: 78.8 kg (173 lb 11.6 oz) 78.9 kg (174 lb) 80.4 kg (177 lb 4 oz)    History of present illness:  on 11/10/2017 by [name] Eric Mckayis a 84 y.o.malewith medical history significant ofHTNl HLD; PVD s/p RLE angioplasty; afib on AC; OSA; DM; stage 3 CKD; and CAD s/p PCI presenting withleftfoot cellulitis. When asked why he came to the hospital, he said, "The doctor said I needed to". He has a sore on his foot and "it's real red, raised, and they're afraid it's gonna go in deep if he don'tget something done about it". It's been there "a while". No fevers. He feels well otherwise.He previously had an infected blister on his right foot for which he was followed by podiatry. He was seen yesterday by [name] for this issue and started on Bactrim and Augmentin. He was seen again today by [name] and sent to the ER due to progressive  infection.  Hospital Course:  Left lower extremitycellulitisand wound infection/PVD -Patient was placed on oral antibiotics, Augmentin and Bactrim prior to admission. He states that his wound has been there for several weeks. Patient was seeing a foot doctor for his right foot previously. -Left foot x-ray showed no acute osseous abnormalities -Obtained MRI without contrast of the left foot: Showing no septic arthritis or osteomyelitis. Cellulitis without discrete drainable soft tissue abscess, mild fasciitis or pyomyositis -Was placed on vancomycin, Flagyl, ceftriaxone.  -CRP 7.1, ESR 78 -blood cultures show no growth to date -ABI R 0.91, L 0.89 (with monophasic waves??) -Cellulitis does appear to be improving -Discussed with [name], patient could still have osteo, but recommended vascular follow up -Vascular surgery consulted and appreciated; patient sees with vasc in [place], [place]-[name] and has intervention on RLE -Vascular surgery recommended angiography-has unreconstructable pedal disease, recommend continued medical management with IV antibiotics and local wound care. No vascular intervention possible for the left lower extremity. -Discussed with [name] and [name]. Will place on oral antibiotics and patient can follow up with [name] (vasc surg) and [name] upon discharge. -Will discharge on Augmentin for additional 9 days  Diabetes mellitus, type II -Currently diet controlled, last hemoglobin A1c was 6 on 09/07/2017  Atrial fibrillation -Continue Eliquis -Continue metoprolol   Essential hypertension -Continue metoprolol, amlodipine -Lisinopril and HCTZ currently held- may resume on discharge  Chronic kidney disease, stage III -Creatinine currently within stage III- creatinine 1.10 -Continue to monitor BMP  Leukocytosis -currently on antibiotics -Resolved -patient afebrile,  with no complaints of urinary issues or upper respiratory issues -blood  cultures shows no growth to date  Consultants Orthopedic surgery Vascular surgery  Procedures  ABI Abdominal aortogram with bilateral lower extremity runoff third order catheterization left superficial femoral artery   Patient is here today for follow-up.  The erythema in his left leg has improved dramatically after antibiotics.  There is still residual erythema on the distal third of his midfoot however there is no warmth or pain.  There is black eschar on the medial aspect of the left great toe.  There is also desquamation around the first MTP joint with callus formation on the plantar aspect of the first MTP joint.  Patient has palpable pedal pulses however his issues have been distal to the MTP joints due to inadequate blood flow to the toes.  Patient has seen his orthopedist as well as the vascular surgeon this week.  They recommended continuing Augmentin for an additional week and have started the patient on nitroglycerin patches to be applied to the dorsum of the foot in an effort to improve blood flow to the distal toes. At that time, my plan was: Hospital discharge follow-up.  There has been dramatic improvement in the erythema of his foot however there is persistent erythema in the area marked and red.  This is much lighter and appears to be fading suggest possibly resolving infection.  There is black eschar suggesting dry gangrene on the medial aspect of the left great toe is marked in black.  If this area is not healing, the patient may ultimately require amputation of the toe.  He is following up with his vascular surgeon and his orthopedist next week.  I would like him to remain on Augmentin until seen back in follow-up.  I will recheck the patient myself in 1 week to monitor progression of the erythema  12/02/17 The improvement has stalled.  Despite continuing to remain on Augmentin, there is persistent erythema distal to the MTP joint.  Patient continues to have an area of black  necrotic eschar and dry gangrene.  However there is now erythema spreading onto the surface of the toe around the dry gangrene.  The skin is becoming white and macerated.  Skin is gradually peeling away all over the toe distal to the MTP joint.  There is also now breakdown of the skin on the plantar and lateral surface of the MTP joint with ulceration.  At that time, my plan was: I believe that the tissue distal to the MTP joint is not viable due to vascular insufficiency.  I believe that there may be an element of osteomyelitis in addition to dry gangrene.  Despite antibiotics and dressing changes and topical nitroglycerin, the situation distal to the MTP joint appears to be worsening.  Patient has an appointment today with his orthopedist.  Unfortunately it appears as though he needs to have the toe removed distal to the MTP joint.  Continue Augmentin at the present time and await the results of the orthopedic consultation this afternoon  12/10/17 Unfortunately, the patient saw his orthopedist, [name] to as well as his vascular surgeon [name].  They have both recommended amputation however below the knee due to vascular insufficiency in order to allow the wound to heal.  He is scheduled to have the surgery performed next week on May 9.  However over the last 48 hours, patient started developing nausea.  Last night he experienced vomiting.  He states that he believes  the antibiotics are upsetting his stomach.  He also reports a burning sensation with food in his epigastric area.  He denies any fevers or chills.  He is reporting some black stool however he is on an iron tablet.  Rectal exam was performed today.  Stool was yellowish-brown.  It was guaiac negative.  The left great toe distal to the MTP joint shows dry gangrene in addition to erythema proximal to the MCP joint.  However there is no spreading erythema up the leg.  It seems to be confined just to area around the MTP joint and distally.  I see no  evidence of spreading cellulitis. Past Medical History:  Diagnosis Date  . Atrial flutter ([place])    s/p RFCA  . CAD (coronary artery disease)    cath 2003, occluded S-RCA, occluded S-Dx, L-LAD ok, s/p PTCA to LAD  . Cataract   . CKD (chronic kidney disease) stage 3, GFR 30-59 ml/min (HCC)   . Diabetes mellitus    diet controlled  . Hearing aid worn    bilateral  . Long term (current) use of anticoagulants   . Neuromuscular disorder ([place])   . OSA (obstructive sleep apnea) 12/11   very mild, AHI 7/hr  . Persistent atrial fibrillation ([place]) 09/26/2015  . Pure hyperglyceridemia   . PVD (peripheral vascular disease) ([place])    angioplasty of his right lower extremity in [place] by Dr.[name] 2013  . Unspecified essential hypertension   . Wears dentures    full upper and lower   Past Surgical History:  Procedure Laterality Date  . ABDOMINAL AORTOGRAM W/LOWER EXTREMITY N/A 11/15/2017   Procedure: ABDOMINAL AORTOGRAM W/LOWER EXTREMITY;  Surgeon: [name], MD;  Location: [facility];  Service: Cardiovascular;  Laterality: N/A;  . Adenosine Myoview  3/06   EF 56%, neg. Ischemia  . Adenosine Myoview  02/18/07   nml  . ANGIOPLASTY  1/99   CAD- diogonal with rotational artherectomy  . Arthrectomy     of LAD & PTCA  . BLEPHAROPLASTY Bilateral   . CARDIAC CATHETERIZATION  1/00  . CARDIOVERSION  1/04  . CARDIOVERSION  5/07   hospital- a flutter  . CARPAL TUNNEL RELEASE     ? bilateral  . CATARACT EXTRACTION W/PHACO Right 07/28/2017   Procedure: CATARACT EXTRACTION PHACO AND INTRAOCULAR LENS PLACEMENT ([place]) RIGHT DIABETIC;  Surgeon: [name], MD;  Location: [facility];  Service: Ophthalmology;  Laterality: Right;  Diabetic - diet controlled  . CATARACT EXTRACTION W/PHACO Left 08/18/2017   Procedure: CATARACT EXTRACTION PHACO AND INTRAOCULAR LENS PLACEMENT ([facility]);  Surgeon: [name], MD;  Location: [facility];  Service: Ophthalmology;   Laterality: Left;  DIABETES - oral meds  . COLONOSCOPY W/ BIOPSIES  10/01/06   sigmoid polyp bx neg, 3 years  . CORONARY ANGIOPLASTY  4/03   cutting balloon PTCA pLAD into Diag  . CORONARY ARTERY BYPASS GRAFT  2000   LIMA-LAD, SVG-RCA, SVG-Diag; SVG-Diag & SVG-RCA occluded 2003  . FRACTURE SURGERY    . HAND SURGERY  08/27/09   R thumb procedure wit Scaphoid Gragt and screws, Dr [name]  . NM MYOVIEW LTD  4/11   normal  . PERIPHERAL VASCULAR CATHETERIZATION Right 06/27/2015   Procedure: Lower Extremity Angiography;  Surgeon: Algernon [name], MD;  Location: [facility];  Service: Cardiovascular;  Laterality: Right;  . PERIPHERAL VASCULAR CATHETERIZATION  06/27/2015   Procedure: Lower Extremity Intervention;  Surgeon: Algernon [name], MD;  Location: [facility];  Service: Cardiovascular;;   Current Outpatient Medications on File Prior to Visit  Medication Sig Dispense Refill  . acetaminophen (TYLENOL) 500 MG tablet Take 1,000 mg by mouth every 8 (eight) hours as needed for mild pain.     [name] amLODipine (NORVASC) 10 MG tablet Take 1 tablet (10 mg total) by mouth daily. 90 tablet 3  . amoxicillin-clavulanate (AUGMENTIN) 875-125 MG tablet Take 1 tablet by mouth 2 (two) times daily. 20 tablet 0  . apixaban (ELIQUIS) 2.5 MG TABS tablet Take 1 tablet (2.5 mg total) by mouth 2 (two) times daily. 180 tablet 1  . B Complex Vitamins (B COMPLEX-B12) TABS Take 1 tablet by mouth daily.     . Blood Glucose Monitoring Suppl (ONE TOUCH ULTRA SYSTEM KIT) W/DEVICE KIT 1 kit by Does not apply route once. 1 each 0  . calcium carbonate (TUMS - DOSED IN MG ELEMENTAL CALCIUM) 500 MG chewable tablet Chew 1 tablet by mouth daily as needed for indigestion or heartburn.    . cholecalciferol (VITAMIN D) 1000 UNITS tablet Take 1,000 Units by mouth 2 (two) times a week. Takes on Mon and Fri    . CINNAMON PO Take 1,000 mg by mouth 2 (two) times daily.     . fenofibrate 160 MG tablet Take 1 tablet (160 mg total) by  mouth daily. 90 tablet 2  . fish oil-omega-3 fatty acids 1000 MG capsule Take 1,000 mg by mouth daily.     [name] glucose blood test strip Use as instructed 100 each 3  . hydrochlorothiazide (HYDRODIURIL) 25 MG tablet Take 1 tablet (25 mg total) by mouth daily. 90 tablet 3  . lisinopril (PRINIVIL,ZESTRIL) 40 MG tablet Take 1 tablet (40 mg total) by mouth daily. 90 tablet 3  . metoprolol tartrate (LOPRESSOR) 50 MG tablet Take 1 tablet (50 mg total) by mouth 2 (two) times daily. 180 tablet 3  . Multiple Vitamin (DAILY MULTIVITAMIN PO) Take 1 tablet by mouth daily.      . nitroGLYCERIN (NITRODUR - DOSED IN MG/24 HR) 0.2 mg/hr patch Place 1 patch (0.2 mg total) onto the skin daily. 30 patch 12  . nutrition supplement, JUVEN, (JUVEN) PACK Take 1 packet by mouth 2 (two) times daily between meals. 60 packet 0  . ONETOUCH DELICA LANCETS FINE MISC check sugar once daily 100 each 2  . pentoxifylline (TRENTAL) 400 MG CR tablet Take 1 tablet (400 mg total) by mouth 3 (three) times daily with meals. 90 tablet 3  . rosuvastatin (CRESTOR) 40 MG tablet Take 1 tablet (40 mg total) by mouth daily. 90 tablet 2   No current facility-administered medications on file prior to visit.    Allergies  Allergen Reactions  . Isosorbide Mononitrate Other (See Comments)    Unknown, doesn't remember   . Ramipril Cough  . Quinidine Gluconate Rash   Social History   Socioeconomic History  . Marital status: Married    Spouse name: Not on file  . Number of children: 5  . Years of education: Not on file  . Highest education level: Not on file  Occupational History  . Occupation: [employer]    Employer: RETIRED  Social Needs  . Financial resource strain: Not on file  . Food insecurity:    Worry: Not on file    Inability: Not on file  . Transportation needs:    Medical: Not on file    Non-medical: Not on file  Tobacco Use  . Smoking status:  Never Smoker  . Smokeless tobacco: Former Network engineer and Sexual  Activity  . Alcohol use: No  . Drug use: No  . Sexual activity: Never  Lifestyle  . Physical activity:    Days per week: Not on file    Minutes per session: Not on file  . Stress: Not on file  Relationships  . Social connections:    Talks on phone: Not on file    Gets together: Not on file    Attends religious service: Not on file    Active member of club or organization: Not on file    Attends meetings of clubs or organizations: Not on file    Relationship status: Not on file  . Intimate partner violence:    Fear of current or ex partner: Not on file    Emotionally abused: Not on file    Physically abused: Not on file    Forced sexual activity: Not on file  Other Topics Concern  . Not on file  Social History Narrative   Retired now does some antiques   Retired from Theatre manager and dye work   Married 1951   5 kids     Review of Systems  All other systems reviewed and are negative.      Objective:   Physical Exam  Cardiovascular: Normal rate and normal heart sounds. An irregularly irregular rhythm present.  No murmur heard. Pulmonary/Chest: Effort normal and breath sounds normal. No respiratory distress. He has no wheezes. He has no rales.  Musculoskeletal:       Feet:  Skin: There is erythema.  Vitals reviewed.         Assessment & Plan:  Dry gangrene ([place]) - Plan: CBC with Differential/Platelet, COMPLETE METABOLIC PANEL WITH GFR  Non-intractable vomiting with nausea, unspecified vomiting type It is possible that the patient is experiencing nausea secondary to the antibiotics after a prolonged course.  I have recommended discontinuation of Augmentin today as there is no evidence of active infection.  Instead there is gangrenous tissue distal to the MTP joint and I do not believe that the antibiotics are providing any benefit to the patient at the present time.  In fact due to arterial insufficiency, I doubt that the antibiotic is penetrating into the affected area.   Therefore I believe it is only likely causing complications.  Given the burning epigastric discomfort, I will also empirically start the patient on dexilant 60 mg a day.  He is guaiac negative today and there is no acute sign of GI bleed.  If the patient's nausea and vomiting get worse, it could be a sign of systemic illness and I would recommend going to the hospital for IV antibiotics however he is already scheduled for surgery next week to remove the necrotic tissue and at the present time there is no indication that this needs to be expedited.

## 2017-12-11 LAB — CBC WITH DIFFERENTIAL/PLATELET
Basophils Absolute: 124 cells/uL (ref 0–200)
Basophils Relative: 1 %
EOS PCT: 6.6 %
Eosinophils Absolute: 818 cells/uL — ABNORMAL HIGH (ref 15–500)
HCT: 34.8 % — ABNORMAL LOW (ref 38.5–50.0)
Hemoglobin: 11.8 g/dL — ABNORMAL LOW (ref 13.2–17.1)
LYMPHS ABS: 3112 {cells}/uL (ref 850–3900)
MCH: 30.9 pg (ref 27.0–33.0)
MCHC: 33.9 g/dL (ref 32.0–36.0)
MCV: 91.1 fL (ref 80.0–100.0)
MONOS PCT: 7.8 %
MPV: 10 fL (ref 7.5–12.5)
NEUTROS PCT: 59.5 %
Neutro Abs: 7378 cells/uL (ref 1500–7800)
PLATELETS: 264 10*3/uL (ref 140–400)
RBC: 3.82 10*6/uL — AB (ref 4.20–5.80)
RDW: 13.6 % (ref 11.0–15.0)
Total Lymphocyte: 25.1 %
WBC mixed population: 967 cells/uL — ABNORMAL HIGH (ref 200–950)
WBC: 12.4 10*3/uL — ABNORMAL HIGH (ref 3.8–10.8)

## 2017-12-13 ENCOUNTER — Other Ambulatory Visit: Payer: Self-pay

## 2017-12-13 ENCOUNTER — Encounter
Admission: RE | Admit: 2017-12-13 | Discharge: 2017-12-13 | Disposition: A | Discharge status: Home / Self Care | Payer: PPO | Source: Ambulatory Visit | Attending: Vascular Surgery | Admitting: Vascular Surgery

## 2017-12-13 DIAGNOSIS — E1152 Type 2 diabetes mellitus with diabetic peripheral angiopathy with gangrene: Secondary | ICD-10-CM | POA: Diagnosis present

## 2017-12-13 DIAGNOSIS — I4892 Unspecified atrial flutter: Secondary | ICD-10-CM | POA: Diagnosis not present

## 2017-12-13 DIAGNOSIS — E1151 Type 2 diabetes mellitus with diabetic peripheral angiopathy without gangrene: Secondary | ICD-10-CM | POA: Diagnosis not present

## 2017-12-13 DIAGNOSIS — I70262 Atherosclerosis of native arteries of extremities with gangrene, left leg: Secondary | ICD-10-CM | POA: Diagnosis not present

## 2017-12-13 DIAGNOSIS — I739 Peripheral vascular disease, unspecified: Secondary | ICD-10-CM | POA: Diagnosis not present

## 2017-12-13 DIAGNOSIS — I10 Essential (primary) hypertension: Secondary | ICD-10-CM | POA: Diagnosis not present

## 2017-12-13 DIAGNOSIS — Z7401 Bed confinement status: Secondary | ICD-10-CM | POA: Diagnosis not present

## 2017-12-13 DIAGNOSIS — E871 Hypo-osmolality and hyponatremia: Secondary | ICD-10-CM | POA: Diagnosis present

## 2017-12-13 DIAGNOSIS — N179 Acute kidney failure, unspecified: Secondary | ICD-10-CM | POA: Diagnosis not present

## 2017-12-13 DIAGNOSIS — R279 Unspecified lack of coordination: Secondary | ICD-10-CM | POA: Diagnosis not present

## 2017-12-13 DIAGNOSIS — E1122 Type 2 diabetes mellitus with diabetic chronic kidney disease: Secondary | ICD-10-CM | POA: Diagnosis present

## 2017-12-13 DIAGNOSIS — Z978 Presence of other specified devices: Secondary | ICD-10-CM | POA: Diagnosis not present

## 2017-12-13 DIAGNOSIS — I481 Persistent atrial fibrillation: Secondary | ICD-10-CM | POA: Diagnosis not present

## 2017-12-13 DIAGNOSIS — R001 Bradycardia, unspecified: Secondary | ICD-10-CM | POA: Diagnosis not present

## 2017-12-13 DIAGNOSIS — E876 Hypokalemia: Secondary | ICD-10-CM | POA: Diagnosis not present

## 2017-12-13 DIAGNOSIS — Z7901 Long term (current) use of anticoagulants: Secondary | ICD-10-CM | POA: Diagnosis not present

## 2017-12-13 DIAGNOSIS — R339 Retention of urine, unspecified: Secondary | ICD-10-CM | POA: Diagnosis present

## 2017-12-13 DIAGNOSIS — E781 Pure hyperglyceridemia: Secondary | ICD-10-CM | POA: Diagnosis not present

## 2017-12-13 DIAGNOSIS — Z01810 Encounter for preprocedural cardiovascular examination: Secondary | ICD-10-CM | POA: Diagnosis not present

## 2017-12-13 DIAGNOSIS — M6281 Muscle weakness (generalized): Secondary | ICD-10-CM | POA: Diagnosis not present

## 2017-12-13 DIAGNOSIS — I7389 Other specified peripheral vascular diseases: Secondary | ICD-10-CM | POA: Diagnosis not present

## 2017-12-13 DIAGNOSIS — S88112D Complete traumatic amputation at level between knee and ankle, left lower leg, subsequent encounter: Secondary | ICD-10-CM | POA: Diagnosis not present

## 2017-12-13 DIAGNOSIS — G4739 Other sleep apnea: Secondary | ICD-10-CM | POA: Diagnosis not present

## 2017-12-13 DIAGNOSIS — N183 Chronic kidney disease, stage 3 (moderate): Secondary | ICD-10-CM | POA: Diagnosis present

## 2017-12-13 DIAGNOSIS — E785 Hyperlipidemia, unspecified: Secondary | ICD-10-CM | POA: Diagnosis present

## 2017-12-13 DIAGNOSIS — A0472 Enterocolitis due to Clostridium difficile, not specified as recurrent: Secondary | ICD-10-CM | POA: Diagnosis present

## 2017-12-13 DIAGNOSIS — R197 Diarrhea, unspecified: Secondary | ICD-10-CM

## 2017-12-13 DIAGNOSIS — Z8679 Personal history of other diseases of the circulatory system: Secondary | ICD-10-CM | POA: Diagnosis not present

## 2017-12-13 DIAGNOSIS — Z974 Presence of external hearing-aid: Secondary | ICD-10-CM | POA: Diagnosis not present

## 2017-12-13 DIAGNOSIS — I4891 Unspecified atrial fibrillation: Secondary | ICD-10-CM | POA: Diagnosis present

## 2017-12-13 DIAGNOSIS — I2510 Atherosclerotic heart disease of native coronary artery without angina pectoris: Secondary | ICD-10-CM | POA: Diagnosis present

## 2017-12-13 DIAGNOSIS — I129 Hypertensive chronic kidney disease with stage 1 through stage 4 chronic kidney disease, or unspecified chronic kidney disease: Secondary | ICD-10-CM | POA: Diagnosis present

## 2017-12-13 DIAGNOSIS — R262 Difficulty in walking, not elsewhere classified: Secondary | ICD-10-CM | POA: Diagnosis not present

## 2017-12-13 DIAGNOSIS — I96 Gangrene, not elsewhere classified: Secondary | ICD-10-CM | POA: Diagnosis present

## 2017-12-13 DIAGNOSIS — Z955 Presence of coronary angioplasty implant and graft: Secondary | ICD-10-CM | POA: Diagnosis not present

## 2017-12-13 HISTORY — DX: Personal history of urinary calculi: Z87.442

## 2017-12-13 LAB — PROTIME-INR
INR: 1.46
PROTHROMBIN TIME: 17.6 s — AB (ref 11.4–15.2)

## 2017-12-13 LAB — TYPE AND SCREEN
ABO/RH(D): B POS
Antibody Screen: NEGATIVE

## 2017-12-13 LAB — APTT: APTT: 40 s — AB (ref 24–36)

## 2017-12-13 NOTE — Patient Instructions (Signed)
Your procedure is scheduled on: Thursday, Dec 16, 2017 Report to Day Surgery on the 2nd floor of the [facility]. To find out your arrival time, please call [phone] between 1PM - 3PM on: Wednesday, Dec 15, 2017  REMEMBER: Instructions that are not followed completely may result in serious medical risk, up to and including death; or upon the discretion of your surgeon and anesthesiologist your surgery may need to be rescheduled.  Do not eat food after midnight the night before your procedure.  No gum chewing, lozengers or hard candies.  You may however, drink water up to 2 hours before you are scheduled to arrive for your surgery. Do not drink anything within 2 hours of the start of your surgery.  No Alcohol for 24 hours before or after surgery.  No Smoking including e-cigarettes for 24 hours prior to surgery.  No chewable tobacco products for at least 6 hours prior to surgery.  No nicotine patches on the day of surgery.  On the morning of surgery brush your teeth with toothpaste and water, you may rinse your mouth with mouthwash if you wish. Do not swallow any toothpaste or mouthwash.  Notify your doctor if there is any change in your medical condition (cold, fever, infection).  Do not wear jewelry, make-up, hairpins, clips or nail polish.  Do not wear lotions, powders, or perfumes. You may wear deodorant.  Do not shave 48 hours prior to surgery. Men may shave face and neck.  Contacts and dentures may not be worn into surgery.  Do not bring valuables to the hospital, including drivers license, insurance or credit cards.  [facility] is not responsible for any belongings or valuables.   TAKE THESE MEDICATIONS THE MORNING OF SURGERY:  1.  AMLODIPINE 2.  METOPROLOL  Use CHG Soap or wipes as directed on instruction sheet.  Follow recommendations from Cardiologist, Pulmonologist or PCP regarding stopping Eliquis.   NOW!  Stop Anti-inflammatories (NSAIDS) such as Advil,  Aleve, Ibuprofen, Motrin, Naproxen, Naprosyn and Aspirin based products such as Excedrin, Goodys Powder, BC Powder. (May take Tylenol or Acetaminophen if needed.)  NOW!  Stop ANY OVER THE COUNTER supplements until after surgery. (CINNAMON, FISH OIL) (May continue Vitamin D, Vitamin B, and multivitamin.)  Wear comfortable clothing (specific to your surgery type) to the hospital.  Plan for stool softeners for home use.  If you are being admitted to the hospital overnight, leave your suitcase in the car. After surgery it may be brought to your room.  Please call [phone] if you have any questions about these instructions.

## 2017-12-14 ENCOUNTER — Other Ambulatory Visit: Payer: PPO

## 2017-12-14 DIAGNOSIS — R197 Diarrhea, unspecified: Secondary | ICD-10-CM

## 2017-12-14 MED ORDER — CEFAZOLIN SODIUM-DEXTROSE 2-4 GM/100ML-% IV SOLN
2.0000 g | INTRAVENOUS | Status: AC
  Administered 2017-12-15: 2 g via INTRAVENOUS

## 2017-12-15 ENCOUNTER — Inpatient Hospital Stay: Payer: PPO | Admitting: Anesthesiology

## 2017-12-15 ENCOUNTER — Other Ambulatory Visit: Payer: Self-pay

## 2017-12-15 ENCOUNTER — Inpatient Hospital Stay
Admission: RE | Admit: 2017-12-15 | Discharge: 2017-12-21 | DRG: 240 | Disposition: A | Discharge status: Skilled Nursing Facility | Payer: PPO | Source: Ambulatory Visit | Attending: Internal Medicine | Admitting: Internal Medicine

## 2017-12-15 ENCOUNTER — Encounter
Admission: RE | Disposition: A | Discharge status: Skilled Nursing Facility | Payer: Self-pay | Source: Ambulatory Visit | Attending: Internal Medicine

## 2017-12-15 DIAGNOSIS — E785 Hyperlipidemia, unspecified: Secondary | ICD-10-CM | POA: Diagnosis present

## 2017-12-15 DIAGNOSIS — N183 Chronic kidney disease, stage 3 (moderate): Secondary | ICD-10-CM | POA: Diagnosis present

## 2017-12-15 DIAGNOSIS — I129 Hypertensive chronic kidney disease with stage 1 through stage 4 chronic kidney disease, or unspecified chronic kidney disease: Secondary | ICD-10-CM | POA: Diagnosis present

## 2017-12-15 DIAGNOSIS — N179 Acute kidney failure, unspecified: Secondary | ICD-10-CM | POA: Diagnosis not present

## 2017-12-15 DIAGNOSIS — Z7901 Long term (current) use of anticoagulants: Secondary | ICD-10-CM | POA: Diagnosis not present

## 2017-12-15 DIAGNOSIS — I2510 Atherosclerotic heart disease of native coronary artery without angina pectoris: Secondary | ICD-10-CM | POA: Diagnosis present

## 2017-12-15 DIAGNOSIS — I4891 Unspecified atrial fibrillation: Secondary | ICD-10-CM | POA: Diagnosis present

## 2017-12-15 DIAGNOSIS — R339 Retention of urine, unspecified: Secondary | ICD-10-CM | POA: Diagnosis present

## 2017-12-15 DIAGNOSIS — E876 Hypokalemia: Secondary | ICD-10-CM | POA: Diagnosis not present

## 2017-12-15 DIAGNOSIS — Z955 Presence of coronary angioplasty implant and graft: Secondary | ICD-10-CM | POA: Diagnosis not present

## 2017-12-15 DIAGNOSIS — E1122 Type 2 diabetes mellitus with diabetic chronic kidney disease: Secondary | ICD-10-CM | POA: Diagnosis present

## 2017-12-15 DIAGNOSIS — E871 Hypo-osmolality and hyponatremia: Secondary | ICD-10-CM | POA: Diagnosis present

## 2017-12-15 DIAGNOSIS — E1152 Type 2 diabetes mellitus with diabetic peripheral angiopathy with gangrene: Secondary | ICD-10-CM | POA: Diagnosis present

## 2017-12-15 DIAGNOSIS — I70262 Atherosclerosis of native arteries of extremities with gangrene, left leg: Secondary | ICD-10-CM | POA: Diagnosis not present

## 2017-12-15 DIAGNOSIS — A0472 Enterocolitis due to Clostridium difficile, not specified as recurrent: Secondary | ICD-10-CM | POA: Diagnosis present

## 2017-12-15 DIAGNOSIS — I4892 Unspecified atrial flutter: Secondary | ICD-10-CM | POA: Diagnosis not present

## 2017-12-15 DIAGNOSIS — I96 Gangrene, not elsewhere classified: Secondary | ICD-10-CM | POA: Diagnosis present

## 2017-12-15 HISTORY — PX: AMPUTATION: SHX166

## 2017-12-15 LAB — CBC
HEMATOCRIT: 32.5 % — AB (ref 40.0–52.0)
Hemoglobin: 10.9 g/dL — ABNORMAL LOW (ref 13.0–18.0)
MCH: 31 pg (ref 26.0–34.0)
MCHC: 33.5 g/dL (ref 32.0–36.0)
MCV: 92.4 fL (ref 80.0–100.0)
Platelets: 232 10*3/uL (ref 150–440)
RBC: 3.52 MIL/uL — ABNORMAL LOW (ref 4.40–5.90)
RDW: 15.4 % — AB (ref 11.5–14.5)
WBC: 7.4 10*3/uL (ref 3.8–10.6)

## 2017-12-15 LAB — PROTIME-INR
INR: 1.11
PROTHROMBIN TIME: 14.2 s (ref 11.4–15.2)

## 2017-12-15 LAB — GLUCOSE, CAPILLARY
GLUCOSE-CAPILLARY: 76 mg/dL (ref 65–99)
Glucose-Capillary: 102 mg/dL — ABNORMAL HIGH (ref 65–99)

## 2017-12-15 LAB — C. DIFFICILE GDH AND TOXIN A/B
GDH ANTIGEN: DETECTED
MICRO NUMBER: 90555602
SPECIMEN QUALITY: ADEQUATE
TOXIN A AND B: DETECTED

## 2017-12-15 LAB — CREATININE, SERUM
CREATININE: 1.46 mg/dL — AB (ref 0.61–1.24)
GFR calc Af Amer: 48 mL/min — ABNORMAL LOW (ref >60)
GFR calc non Af Amer: 41 mL/min — ABNORMAL LOW (ref >60)

## 2017-12-15 LAB — ABO/RH: ABO/RH(D): B POS

## 2017-12-15 SURGERY — AMPUTATION BELOW KNEE
Anesthesia: General | Site: Leg Lower | Laterality: Left | Wound class: Dirty or Infected

## 2017-12-15 MED ORDER — FENOFIBRATE 160 MG PO TABS
160.0000 mg | ORAL_TABLET | Freq: Every day | ORAL | Status: DC
  Administered 2017-12-15 – 2017-12-20 (×6): 160 mg via ORAL
  Filled 2017-12-15 (×7): qty 1

## 2017-12-15 MED ORDER — FENTANYL CITRATE (PF) 100 MCG/2ML IJ SOLN
25.0000 ug | INTRAMUSCULAR | Status: DC | PRN
  Administered 2017-12-15 (×2): 50 ug via INTRAVENOUS
  Administered 2017-12-15 (×2): 25 ug via INTRAVENOUS

## 2017-12-15 MED ORDER — ACETAMINOPHEN 650 MG RE SUPP: 325.0000 mg | RECTAL | Status: DC | PRN

## 2017-12-15 MED ORDER — FAMOTIDINE IN NACL 20-0.9 MG/50ML-% IV SOLN
20.0000 mg | INTRAVENOUS | Status: DC
  Administered 2017-12-16 – 2017-12-20 (×5): 20 mg via INTRAVENOUS
  Filled 2017-12-15 (×6): qty 50

## 2017-12-15 MED ORDER — PROPOFOL 10 MG/ML IV BOLUS
INTRAVENOUS | Status: AC
  Filled 2017-12-15: qty 20

## 2017-12-15 MED ORDER — FENTANYL CITRATE (PF) 100 MCG/2ML IJ SOLN
INTRAMUSCULAR | Status: AC
  Filled 2017-12-15: qty 2

## 2017-12-15 MED ORDER — ONDANSETRON HCL 4 MG/2ML IJ SOLN
INTRAMUSCULAR | Status: AC
  Filled 2017-12-15: qty 2

## 2017-12-15 MED ORDER — PROPOFOL 10 MG/ML IV BOLUS
INTRAVENOUS | Status: DC | PRN
  Administered 2017-12-15: 100 mg via INTRAVENOUS

## 2017-12-15 MED ORDER — OXYCODONE HCL 5 MG PO TABS: 5.0000 mg | ORAL_TABLET | Freq: Once | ORAL | Status: DC | PRN

## 2017-12-15 MED ORDER — VITAMIN D 1000 UNITS PO TABS
1000.0000 [IU] | ORAL_TABLET | ORAL | Status: DC
  Administered 2017-12-17 – 2017-12-20 (×2): 1000 [IU] via ORAL
  Filled 2017-12-15 (×2): qty 1

## 2017-12-15 MED ORDER — ALUM & MAG HYDROXIDE-SIMETH 200-200-20 MG/5ML PO SUSP: 15.0000 mL | ORAL | Status: DC | PRN

## 2017-12-15 MED ORDER — ACETAMINOPHEN 10 MG/ML IV SOLN
INTRAVENOUS | Status: DC | PRN
  Administered 2017-12-15: 1000 mg via INTRAVENOUS

## 2017-12-15 MED ORDER — FAMOTIDINE 20 MG PO TABS
20.0000 mg | ORAL_TABLET | Freq: Once | ORAL | Status: AC
  Administered 2017-12-15: 20 mg via ORAL

## 2017-12-15 MED ORDER — HYDROCHLOROTHIAZIDE 25 MG PO TABS
25.0000 mg | ORAL_TABLET | Freq: Every day | ORAL | Status: DC
  Administered 2017-12-15 – 2017-12-18 (×4): 25 mg via ORAL
  Filled 2017-12-15 (×4): qty 1

## 2017-12-15 MED ORDER — ACETAMINOPHEN 500 MG PO TABS: 1000.0000 mg | ORAL_TABLET | Freq: Three times a day (TID) | ORAL | Status: DC | PRN

## 2017-12-15 MED ORDER — ROSUVASTATIN CALCIUM 10 MG PO TABS
40.0000 mg | ORAL_TABLET | Freq: Every day | ORAL | Status: DC
  Administered 2017-12-15 – 2017-12-20 (×6): 40 mg via ORAL
  Filled 2017-12-15 (×6): qty 4

## 2017-12-15 MED ORDER — FAMOTIDINE 20 MG PO TABS
ORAL_TABLET | ORAL | Status: AC
  Administered 2017-12-15: 20 mg via ORAL
  Filled 2017-12-15: qty 1

## 2017-12-15 MED ORDER — METOPROLOL TARTRATE 5 MG/5ML IV SOLN: 2.0000 mg | INTRAVENOUS | Status: DC | PRN

## 2017-12-15 MED ORDER — POTASSIUM CHLORIDE CRYS ER 20 MEQ PO TBCR: 20.0000 meq | EXTENDED_RELEASE_TABLET | Freq: Every day | ORAL | Status: DC | PRN

## 2017-12-15 MED ORDER — PHENOL 1.4 % MT LIQD
1.0000 | OROMUCOSAL | Status: DC | PRN
  Filled 2017-12-15: qty 177

## 2017-12-15 MED ORDER — HYDROMORPHONE HCL 1 MG/ML IJ SOLN
INTRAMUSCULAR | Status: AC
  Administered 2017-12-15: 0.25 mg via INTRAVENOUS
  Filled 2017-12-15: qty 1

## 2017-12-15 MED ORDER — FENTANYL CITRATE (PF) 100 MCG/2ML IJ SOLN
INTRAMUSCULAR | Status: DC | PRN
  Administered 2017-12-15: 25 ug via INTRAVENOUS
  Administered 2017-12-15: 50 ug via INTRAVENOUS
  Administered 2017-12-15: 25 ug via INTRAVENOUS

## 2017-12-15 MED ORDER — LIDOCAINE HCL (PF) 2 % IJ SOLN
INTRAMUSCULAR | Status: AC
  Filled 2017-12-15: qty 20

## 2017-12-15 MED ORDER — METOPROLOL TARTRATE 50 MG PO TABS
50.0000 mg | ORAL_TABLET | Freq: Two times a day (BID) | ORAL | Status: DC
  Administered 2017-12-15 – 2017-12-18 (×7): 50 mg via ORAL
  Filled 2017-12-15 (×8): qty 1

## 2017-12-15 MED ORDER — OXYCODONE-ACETAMINOPHEN 5-325 MG PO TABS
1.0000 | ORAL_TABLET | ORAL | Status: DC | PRN
  Administered 2017-12-15 – 2017-12-17 (×7): 2 via ORAL
  Administered 2017-12-17 – 2017-12-19 (×2): 1 via ORAL
  Filled 2017-12-15 (×2): qty 2
  Filled 2017-12-15: qty 1
  Filled 2017-12-15 (×2): qty 2
  Filled 2017-12-15: qty 1
  Filled 2017-12-15 (×3): qty 2

## 2017-12-15 MED ORDER — EPHEDRINE SULFATE 50 MG/ML IJ SOLN
INTRAMUSCULAR | Status: DC | PRN
  Administered 2017-12-15: 10 mg via INTRAVENOUS

## 2017-12-15 MED ORDER — SORBITOL 70 % SOLN
30.0000 mL | Freq: Every day | Status: DC | PRN
  Filled 2017-12-15: qty 30

## 2017-12-15 MED ORDER — DEXAMETHASONE SODIUM PHOSPHATE 10 MG/ML IJ SOLN
INTRAMUSCULAR | Status: AC
  Filled 2017-12-15: qty 1

## 2017-12-15 MED ORDER — ONDANSETRON HCL 4 MG/2ML IJ SOLN
4.0000 mg | Freq: Four times a day (QID) | INTRAMUSCULAR | Status: DC | PRN
  Administered 2017-12-17 (×3): 4 mg via INTRAVENOUS
  Filled 2017-12-15 (×4): qty 2

## 2017-12-15 MED ORDER — HYDRALAZINE HCL 20 MG/ML IJ SOLN: 5.0000 mg | INTRAMUSCULAR | Status: DC | PRN

## 2017-12-15 MED ORDER — PENTOXIFYLLINE ER 400 MG PO TBCR
400.0000 mg | EXTENDED_RELEASE_TABLET | Freq: Three times a day (TID) | ORAL | Status: DC
  Administered 2017-12-15 – 2017-12-21 (×18): 400 mg via ORAL
  Filled 2017-12-15 (×19): qty 1

## 2017-12-15 MED ORDER — PHENYLEPHRINE HCL 10 MG/ML IJ SOLN
INTRAMUSCULAR | Status: DC | PRN
  Administered 2017-12-15 (×2): 80 ug via INTRAVENOUS

## 2017-12-15 MED ORDER — LISINOPRIL 20 MG PO TABS
40.0000 mg | ORAL_TABLET | Freq: Every day | ORAL | Status: DC
  Administered 2017-12-15 – 2017-12-18 (×4): 40 mg via ORAL
  Filled 2017-12-15 (×5): qty 2

## 2017-12-15 MED ORDER — ONDANSETRON HCL 4 MG/2ML IJ SOLN
INTRAMUSCULAR | Status: DC | PRN
  Administered 2017-12-15: 4 mg via INTRAVENOUS

## 2017-12-15 MED ORDER — APIXABAN 2.5 MG PO TABS
2.5000 mg | ORAL_TABLET | Freq: Two times a day (BID) | ORAL | Status: DC
  Administered 2017-12-15 – 2017-12-20 (×11): 2.5 mg via ORAL
  Filled 2017-12-15 (×11): qty 1

## 2017-12-15 MED ORDER — ADULT MULTIVITAMIN W/MINERALS CH
1.0000 | ORAL_TABLET | Freq: Every day | ORAL | Status: DC
  Administered 2017-12-15 – 2017-12-20 (×6): 1 via ORAL
  Filled 2017-12-15 (×6): qty 1

## 2017-12-15 MED ORDER — ACETAMINOPHEN 325 MG PO TABS
325.0000 mg | ORAL_TABLET | ORAL | Status: DC | PRN
  Administered 2017-12-19: 650 mg via ORAL
  Filled 2017-12-15: qty 2

## 2017-12-15 MED ORDER — SODIUM CHLORIDE 0.9 % IV SOLN
INTRAVENOUS | Status: DC
  Administered 2017-12-15: 07:00:00 via INTRAVENOUS

## 2017-12-15 MED ORDER — LABETALOL HCL 5 MG/ML IV SOLN: 10.0000 mg | INTRAVENOUS | Status: DC | PRN

## 2017-12-15 MED ORDER — FAMOTIDINE IN NACL 20-0.9 MG/50ML-% IV SOLN: 20.0000 mg | Freq: Two times a day (BID) | INTRAVENOUS | Status: DC

## 2017-12-15 MED ORDER — SODIUM CHLORIDE 0.9 % IV SOLN
INTRAVENOUS | Status: DC
  Administered 2017-12-16 – 2017-12-19 (×6): via INTRAVENOUS

## 2017-12-15 MED ORDER — HYDROMORPHONE HCL 1 MG/ML IJ SOLN
0.2500 mg | INTRAMUSCULAR | Status: DC | PRN
  Administered 2017-12-15 (×4): 0.25 mg via INTRAVENOUS

## 2017-12-15 MED ORDER — DOCUSATE SODIUM 100 MG PO CAPS
100.0000 mg | ORAL_CAPSULE | Freq: Every day | ORAL | Status: DC
  Administered 2017-12-16 – 2017-12-20 (×3): 100 mg via ORAL
  Filled 2017-12-15 (×3): qty 1

## 2017-12-15 MED ORDER — MAGNESIUM SULFATE 2 GM/50ML IV SOLN: 2.0000 g | Freq: Every day | INTRAVENOUS | Status: DC | PRN

## 2017-12-15 MED ORDER — MORPHINE SULFATE (PF) 2 MG/ML IV SOLN
2.0000 mg | INTRAVENOUS | Status: DC | PRN
  Administered 2017-12-15: 2 mg via INTRAVENOUS
  Administered 2017-12-15 (×2): 4 mg via INTRAVENOUS
  Administered 2017-12-15: 3 mg via INTRAVENOUS
  Administered 2017-12-16: 4 mg via INTRAVENOUS
  Administered 2017-12-16: 2 mg via INTRAVENOUS
  Administered 2017-12-17: 4 mg via INTRAVENOUS
  Administered 2017-12-19: 2 mg via INTRAVENOUS
  Filled 2017-12-15 (×2): qty 1
  Filled 2017-12-15 (×3): qty 2
  Filled 2017-12-15: qty 1
  Filled 2017-12-15 (×2): qty 2
  Filled 2017-12-15: qty 1

## 2017-12-15 MED ORDER — DEXAMETHASONE SODIUM PHOSPHATE 10 MG/ML IJ SOLN
INTRAMUSCULAR | Status: DC | PRN
  Administered 2017-12-15: 10 mg via INTRAVENOUS

## 2017-12-15 MED ORDER — FENTANYL CITRATE (PF) 100 MCG/2ML IJ SOLN
INTRAMUSCULAR | Status: AC
  Administered 2017-12-15: 25 ug via INTRAVENOUS
  Filled 2017-12-15: qty 2

## 2017-12-15 MED ORDER — CINNAMON 500 MG PO CAPS: 1000.0000 mg | ORAL_CAPSULE | Freq: Two times a day (BID) | ORAL | Status: DC

## 2017-12-15 MED ORDER — AMLODIPINE BESYLATE 10 MG PO TABS
10.0000 mg | ORAL_TABLET | Freq: Every day | ORAL | Status: DC
  Administered 2017-12-16 – 2017-12-18 (×3): 10 mg via ORAL
  Filled 2017-12-15 (×4): qty 1

## 2017-12-15 MED ORDER — OMEGA-3-ACID ETHYL ESTERS 1 G PO CAPS
1000.0000 mg | ORAL_CAPSULE | Freq: Every day | ORAL | Status: DC
  Administered 2017-12-15 – 2017-12-20 (×6): 1000 mg via ORAL
  Filled 2017-12-15 (×6): qty 1

## 2017-12-15 MED ORDER — MEPERIDINE HCL 50 MG/ML IJ SOLN
6.2500 mg | INTRAMUSCULAR | Status: DC | PRN
Start: 2017-12-15 — End: 2017-12-15

## 2017-12-15 MED ORDER — GUAIFENESIN-DM 100-10 MG/5ML PO SYRP: 15.0000 mL | ORAL_SOLUTION | ORAL | Status: DC | PRN

## 2017-12-15 MED ORDER — HEPARIN SODIUM (PORCINE) 5000 UNIT/ML IJ SOLN: 5000.0000 [IU] | Freq: Three times a day (TID) | INTRAMUSCULAR | Status: DC

## 2017-12-15 MED ORDER — B COMPLEX-C PO TABS
1.0000 | ORAL_TABLET | Freq: Every day | ORAL | Status: DC
  Administered 2017-12-16 – 2017-12-20 (×5): 1 via ORAL
  Filled 2017-12-15 (×7): qty 1

## 2017-12-15 MED ORDER — CEFAZOLIN SODIUM-DEXTROSE 2-4 GM/100ML-% IV SOLN
INTRAVENOUS | Status: AC
  Filled 2017-12-15: qty 100

## 2017-12-15 MED ORDER — FENTANYL CITRATE (PF) 100 MCG/2ML IJ SOLN
INTRAMUSCULAR | Status: AC
  Administered 2017-12-15: 50 ug via INTRAVENOUS
  Filled 2017-12-15: qty 2

## 2017-12-15 MED ORDER — CALCIUM CARBONATE ANTACID 500 MG PO CHEW: 1.0000 | CHEWABLE_TABLET | Freq: Every day | ORAL | Status: DC | PRN

## 2017-12-15 MED ORDER — PROMETHAZINE HCL 25 MG/ML IJ SOLN: 6.2500 mg | INTRAMUSCULAR | Status: DC | PRN

## 2017-12-15 MED ORDER — ONETOUCH ULTRA SYSTEM W/DEVICE KIT: 1.0000 | PACK | Freq: Once | Status: DC

## 2017-12-15 MED ORDER — CHLORHEXIDINE GLUCONATE CLOTH 2 % EX PADS: 6.0000 | MEDICATED_PAD | Freq: Once | CUTANEOUS | Status: DC

## 2017-12-15 MED ORDER — LIDOCAINE 2% (20 MG/ML) 5 ML SYRINGE
INTRAMUSCULAR | Status: DC | PRN
  Administered 2017-12-15: 100 mg via INTRAVENOUS

## 2017-12-15 MED ORDER — JUVEN PO PACK
1.0000 | PACK | Freq: Two times a day (BID) | ORAL | Status: DC
  Administered 2017-12-15 – 2017-12-19 (×8): 1 via ORAL

## 2017-12-15 MED ORDER — POLYETHYLENE GLYCOL 3350 17 G PO PACK: 17.0000 g | PACK | Freq: Every day | ORAL | Status: DC | PRN

## 2017-12-15 MED ORDER — ACETAMINOPHEN 10 MG/ML IV SOLN
INTRAVENOUS | Status: AC
  Filled 2017-12-15: qty 100

## 2017-12-15 MED ORDER — OXYCODONE HCL 5 MG/5ML PO SOLN: 5.0000 mg | Freq: Once | ORAL | Status: DC | PRN

## 2017-12-15 MED ORDER — CEFAZOLIN SODIUM-DEXTROSE 2-4 GM/100ML-% IV SOLN
2.0000 g | Freq: Three times a day (TID) | INTRAVENOUS | Status: AC
  Administered 2017-12-15 – 2017-12-16 (×2): 2 g via INTRAVENOUS
  Filled 2017-12-15 (×3): qty 100

## 2017-12-15 MED ORDER — ROCURONIUM BROMIDE 50 MG/5ML IV SOLN
INTRAVENOUS | Status: AC
  Filled 2017-12-15: qty 1

## 2017-12-15 SURGICAL SUPPLY — 37 items
BANDAGE ELASTIC 6 LF NS (GAUZE/BANDAGES/DRESSINGS) ×3 IMPLANT
BLADE SAGITTAL WIDE XTHICK NO (BLADE) ×3 IMPLANT
BNDG CMPR MED 5X6 ELC HKLP NS (GAUZE/BANDAGES/DRESSINGS) ×1
BNDG COHESIVE 4X5 TAN STRL (GAUZE/BANDAGES/DRESSINGS) ×3 IMPLANT
BNDG GAUZE 4.5X4.1 6PLY STRL (MISCELLANEOUS) ×6 IMPLANT
BRUSH SCRUB EZ  4% CHG (MISCELLANEOUS) ×2
BRUSH SCRUB EZ 4% CHG (MISCELLANEOUS) ×1 IMPLANT
CANISTER SUCT 1200ML W/VALVE (MISCELLANEOUS) ×3 IMPLANT
DRAIN PENROSE 1/4X12 LTX (DRAIN) ×3 IMPLANT
DURAPREP 26ML APPLICATOR (WOUND CARE) ×5 IMPLANT
ELECT CAUTERY BLADE 6.4 (BLADE) ×3 IMPLANT
ELECT REM PT RETURN 9FT ADLT (ELECTROSURGICAL) ×3
ELECTRODE REM PT RTRN 9FT ADLT (ELECTROSURGICAL) ×1 IMPLANT
GAUZE PETRO XEROFOAM 1X8 (MISCELLANEOUS) ×6 IMPLANT
GLOVE BIO SURGEON STRL SZ7 (GLOVE) ×6 IMPLANT
GLOVE INDICATOR 7.5 STRL GRN (GLOVE) ×3 IMPLANT
GOWN STRL REUS W/ TWL LRG LVL3 (GOWN DISPOSABLE) ×2 IMPLANT
GOWN STRL REUS W/ TWL XL LVL3 (GOWN DISPOSABLE) ×1 IMPLANT
GOWN STRL REUS W/TWL LRG LVL3 (GOWN DISPOSABLE) ×3
GOWN STRL REUS W/TWL XL LVL3 (GOWN DISPOSABLE) ×3
HANDLE YANKAUER SUCT BULB TIP (MISCELLANEOUS) ×3 IMPLANT
KIT TURNOVER KIT A (KITS) ×3 IMPLANT
LABEL OR SOLS (LABEL) ×3 IMPLANT
NS IRRIG 1000ML POUR BTL (IV SOLUTION) ×3 IMPLANT
PACK EXTREMITY ARMC (MISCELLANEOUS) ×3 IMPLANT
PAD ABD DERMACEA PRESS 5X9 (GAUZE/BANDAGES/DRESSINGS) ×6 IMPLANT
PAD PREP 24X41 OB/GYN DISP (PERSONAL CARE ITEMS) ×3 IMPLANT
SPONGE LAP 18X18 5 PK (GAUZE/BANDAGES/DRESSINGS) ×5 IMPLANT
STAPLER SKIN PROX 35W (STAPLE) ×5 IMPLANT
STOCKINETTE M/LG 89821 (MISCELLANEOUS) ×3 IMPLANT
SUT SILK 2 0 (SUTURE) ×3
SUT SILK 2 0 SH (SUTURE) ×6 IMPLANT
SUT SILK 2-0 18XBRD TIE 12 (SUTURE) ×1 IMPLANT
SUT SILK 3 0 (SUTURE) ×3
SUT SILK 3-0 18XBRD TIE 12 (SUTURE) ×1 IMPLANT
SUT VIC AB 0 CT1 36 (SUTURE) ×8 IMPLANT
SUT VIC AB 2-0 CT1 (SUTURE) ×6 IMPLANT

## 2017-12-15 NOTE — H&P (Signed)
[facility] History & Physical Update  The patient was interviewed and re-examined.  The patient's previous History and Physical has been reviewed and is unchanged.  There is no change in the plan of care. We plan to proceed with the scheduled procedure.  Leotis Pain, MD  12/15/2017, 7:23 AM

## 2017-12-15 NOTE — Anesthesia Procedure Notes (Signed)
Procedure Name: LMA Insertion Date/Time: 12/15/2017 8:05 AM Performed by: Marsh Dolly, CRNA Pre-anesthesia Checklist: Patient identified, Patient being monitored, Timeout performed, Emergency Drugs available and Suction available Patient Re-evaluated:Patient Re-evaluated prior to induction Oxygen Delivery Method: Circle system utilized Preoxygenation: Pre-oxygenation with 100% oxygen Induction Type: IV induction Ventilation: Mask ventilation without difficulty LMA: LMA inserted LMA Size: 5.0 Tube type: Oral Number of attempts: 1 Placement Confirmation: positive ETCO2 and breath sounds checked- equal and bilateral Tube secured with: Tape Dental Injury: Teeth and Oropharynx as per pre-operative assessment

## 2017-12-15 NOTE — Anesthesia Preprocedure Evaluation (Addendum)
Anesthesia Evaluation  Patient identified by MRN, date of birth, ID band Patient awake    Reviewed: Allergy & Precautions, NPO status , Patient's Chart, lab work & pertinent test results  History of Anesthesia Complications Negative for: history of anesthetic complications  Airway Mallampati: III  TM Distance: >3 FB Neck ROM: Full    Dental  (+) Edentulous Upper, Edentulous Lower   Pulmonary sleep apnea , neg COPD,    breath sounds clear to auscultation- rhonchi (-) wheezing      Cardiovascular hypertension, + CAD, + Cardiac Stents, + CABG and + Peripheral Vascular Disease  + dysrhythmias Atrial Fibrillation  Rhythm:Regular Rate:Normal - Systolic murmurs and - Diastolic murmurs    Neuro/Psych negative neurological ROS  negative psych ROS   GI/Hepatic negative GI ROS, Neg liver ROS,   Endo/Other  diabetes (diet controlled)  Renal/GU Renal InsufficiencyRenal disease     Musculoskeletal negative musculoskeletal ROS (+)   Abdominal (+) - obese,   Peds  Hematology negative hematology ROS (+)   Anesthesia Other Findings Past Medical History: No date: Atrial flutter (HCC)     Comment:  s/p RFCA No date: CAD (coronary artery disease)     Comment:  cath 2003, occluded S-RCA, occluded S-Dx, L-LAD ok, s/p               PTCA to LAD No date: Cataract No date: CKD (chronic kidney disease) stage 3, GFR 30-59 ml/min (HCC) No date: Diabetes mellitus     Comment:  diet controlled No date: Hearing aid worn     Comment:  bilateral No date: History of kidney stones No date: Long term (current) use of anticoagulants No date: Neuromuscular disorder ([place]) 12/11: OSA (obstructive sleep apnea)     Comment:  very mild, AHI 7/hr 09/26/2015: Persistent atrial fibrillation (HCC) No date: Pure hyperglyceridemia No date: PVD (peripheral vascular disease) ([place])     Comment:  angioplasty of his right lower extremity in [place]         by Dr.[name] 2013 No date: Unspecified essential hypertension No date: Wears dentures     Comment:  full upper and lower   Reproductive/Obstetrics                             Anesthesia Physical Anesthesia Plan  ASA: III  Anesthesia Plan: General   Post-op Pain Management:    Induction: Intravenous  PONV Risk Score and Plan: 1 and Ondansetron  Airway Management Planned: LMA  Additional Equipment:   Intra-op Plan:   Post-operative Plan:   Informed Consent: I have reviewed the patients History and Physical, chart, labs and discussed the procedure including the risks, benefits and alternatives for the proposed anesthesia with the patient or authorized representative who has indicated his/her understanding and acceptance.   Dental advisory given  Plan Discussed with: CRNA and Anesthesiologist  Anesthesia Plan Comments:         Anesthesia Quick Evaluation

## 2017-12-15 NOTE — Progress Notes (Signed)
[facility]  Daily Progress Note   Subjective  - Day of Surgery  Doing quite well.  Mild pain.  Able to keep his knee fairly straight  Objective Vitals:   12/15/17 1100 12/15/17 1150 12/15/17 1205 12/15/17 1212  BP:  130/74 132/65 (!) 141/51  Pulse:  64 69 62  Resp:  17 14 16   Temp:  98 F (36.7 C) 97.6 F (36.4 C)   TempSrc:  Oral Oral   SpO2:  100% 100% 90%  Weight: 173 lb 11.6 oz (78.8 kg)     Height: 5\' 5"  (1.651 m)       Intake/Output Summary (Last 24 hours) at 12/15/2017 1734 Last data filed at 12/15/2017 1705 Gross per 24 hour  Intake 1167.5 ml  Output 50 ml  Net 1117.5 ml     VASC  dressing clean, dry, and intact  Laboratory CBC    Component Value Date/Time   WBC 7.4 12/15/2017 1052   HGB 10.9 (L) 12/15/2017 1052   HCT 32.5 (L) 12/15/2017 1052   PLT 232 12/15/2017 1052    BMET    Component Value Date/Time   NA 131 (L) 12/10/2017 1038   NA 139 04/28/2012 0658   K 4.2 12/10/2017 1038   K 4.2 04/28/2012 0658   CL 99 12/10/2017 1038   CL 106 04/28/2012 0658   CO2 26 12/10/2017 1038   CO2 25 04/28/2012 0658   GLUCOSE 128 (H) 12/10/2017 1038   GLUCOSE 114 (H) 04/28/2012 0658   BUN 29 (H) 12/10/2017 1038   BUN 32 (H) 04/28/2012 0658   CREATININE 1.46 (H) 12/15/2017 1052   CREATININE 1.44 (H) 12/10/2017 1038   CALCIUM 9.8 12/10/2017 1038   CALCIUM 8.9 04/28/2012 0658   GFRNONAA 41 (L) 12/15/2017 1052   GFRNONAA 43 (L) 12/10/2017 1038   GFRAA 48 (L) 12/15/2017 1052   GFRAA 50 (L) 12/10/2017 1038    Assessment/Planning: POD #0 s/p left BKA   Has already been out of bed  PT and OT tomorrow  Likely discharge to rehab on Friday    Eric [name]  12/15/2017, 5:34 PM

## 2017-12-15 NOTE — Transfer of Care (Signed)
Immediate Anesthesia Transfer of Care Note  Patient: Eric [name].  Procedure(s) Performed: AMPUTATION BELOW KNEE (Left Leg Lower)  Patient Location: PACU  Anesthesia Type:General  Level of Consciousness: awake, alert  and oriented  Airway & Oxygen Therapy: Patient Spontanous Breathing and Patient connected to face mask oxygen  Post-op Assessment: Report given to RN  Post vital signs: Reviewed and stable  Last Vitals:  Vitals Value Taken Time  BP    Temp    Pulse    Resp    SpO2      Last Pain:  Vitals:   12/15/17 0609  TempSrc: Tympanic  PainSc: 10-Worst pain ever         Complications: No apparent anesthesia complications

## 2017-12-15 NOTE — NC FL2 (Signed)
[place] LEVEL OF CARE SCREENING TOOL     IDENTIFICATION  Patient Name: Eric [name]. Birthdate: 12/26/1927 Sex: male Admission Date (Current Location): 12/15/2017  [place] and [place] Number:  Engineering geologist and Address:  [facility], [address] [place], [place] [address]      Provider Number: [id number]  Attending Physician Name and Address:  Algernon [name], MD  Relative Name and Phone Number:       Current Level of Care: Hospital Recommended Level of Care: [place] Prior Approval Number:    Date Approved/Denied:   PASRR Number: [id number]  Discharge Plan: SNF    Current Diagnoses: Patient Active Problem List   Diagnosis Date Noted  . Gangrene of left foot ([place]) 12/15/2017  . Atherosclerosis of native arteries of the extremities with ulceration ([place]) 11/23/2017  . Cellulitis 11/10/2017  . Chronic anticoagulation 09/26/2015  . Persistent atrial fibrillation ([place]) 09/26/2015  . Olecranon bursitis 04/23/2014  . Preop cardiovascular exam 04/13/2013  . Routine general medical examination at a health care facility 10/10/2012  . PVD (peripheral vascular disease) ([place]) 06/02/2012  . Diabetes mellitus type 2 with peripheral artery disease ([place]) 05/17/2012  . Microhematuria 11/16/2011  . BPH (benign prostatic hyperplasia) 11/16/2011  . Chronic kidney disease, stage III (moderate) ([place]) 08/26/2011  . Leg pain 08/26/2011  . Coronary artery disease 03/25/2011  . OBSTRUCTIVE SLEEP APNEA 09/15/2010  . PERIODIC LIMB MOVEMENT DISORDER 09/15/2010  . SLEEP APNEA 08/26/2010  . PERSONAL HISTORY OF COLONIC POLYPS 10/25/2009  . VITAMIN B12 DEFICIENCY 03/04/2009  . Diabetes mellitus with neuropathy ([place]) 11/24/2006  . HYPERTRIGLYCERIDEMIA 11/24/2006  . Essential hypertension 11/24/2006    Orientation RESPIRATION BLADDER Height & Weight     Self, Time, Situation, Place  Normal Incontinent Weight: 173 lb 11.6  oz (78.8 kg) Height:  5\' 5"  (165.1 cm)  BEHAVIORAL SYMPTOMS/MOOD NEUROLOGICAL BOWEL NUTRITION STATUS  (none) (none) Continent Diet(Carb Modified )  AMBULATORY STATUS COMMUNICATION OF NEEDS Skin   Extensive Assist Verbally Surgical wounds(New BKA)                       Personal Care Assistance Level of Assistance  Bathing, Feeding, Dressing Bathing Assistance: Limited assistance Feeding assistance: Independent Dressing Assistance: Limited assistance     Functional Limitations Info  Sight, Hearing, Speech Sight Info: Adequate Hearing Info: Impaired Speech Info: Adequate    SPECIAL CARE FACTORS FREQUENCY  OT (By licensed OT), PT (By licensed PT)                    Contractures Contractures Info: Not present    Additional Factors Info  Code Status, Allergies Code Status Info: Full code  Allergies Info: Isosorbide Mononitrate, Ramipril, Quinidine Gluconate           Current Medications (12/15/2017):  This is the current hospital active medication list Current Facility-Administered Medications  Medication Dose Route Frequency Provider Last Rate Last Dose  . 0.9 %  sodium chloride infusion   Intravenous Continuous Algernon [name], MD 75 mL/hr at 12/15/17 1243    . acetaminophen (TYLENOL) tablet 325-650 mg  325-650 mg Oral Q4H PRN Algernon [name], MD       Or  . acetaminophen (TYLENOL) suppository 325-650 mg  325-650 mg Rectal Q4H PRN Algernon [name], MD      . alum & mag hydroxide-simeth (MAALOX/MYLANTA) 200-200-20 MG/5ML suspension 15-30 mL  15-30 mL Oral Q2H PRN [name],  Erskine Squibb, MD      . Derrill Memo ON 12/16/2017] amLODipine (NORVASC) tablet 10 mg  10 mg Oral Daily [name], Erskine Squibb, MD      . apixaban (ELIQUIS) tablet 2.5 mg  2.5 mg Oral BID Algernon [name], MD      . B-complex with vitamin C tablet 1 tablet  1 tablet Oral Daily [name], Erskine Squibb, MD      . calcium carbonate (TUMS - dosed in mg elemental calcium) chewable tablet 200 mg of elemental calcium  1 tablet Oral Daily PRN Algernon [name],  MD      . ceFAZolin (ANCEF) 2-4 GM/100ML-% IVPB           . ceFAZolin (ANCEF) IVPB 2g/100 mL premix  2 g Intravenous Q8H Algernon [name], MD      . Derrill Memo ON 12/17/2017] cholecalciferol (VITAMIN D) tablet 1,000 Units  1,000 Units Oral Once per day on Mon Fri [name], Jason S, MD      . Derrill Memo ON 12/16/2017] docusate sodium (COLACE) capsule 100 mg  100 mg Oral Daily Algernon [name], MD      . Derrill Memo ON 12/16/2017] famotidine (PEPCID) IVPB 20 mg premix  20 mg Intravenous Q24H Algernon [name], MD      . fenofibrate tablet 160 mg  160 mg Oral Daily Algernon [name], MD   160 mg at 12/15/17 1257  . guaiFENesin-dextromethorphan (ROBITUSSIN DM) 100-10 MG/5ML syrup 15 mL  15 mL Oral Q4H PRN Algernon [name], MD      . hydrALAZINE (APRESOLINE) injection 5 mg  5 mg Intravenous Q20 Min PRN Algernon [name], MD      . hydrochlorothiazide (HYDRODIURIL) tablet 25 mg  25 mg Oral Daily Algernon [name], MD   25 mg at 12/15/17 1252  . labetalol (NORMODYNE,TRANDATE) injection 10 mg  10 mg Intravenous Q10 min PRN Algernon [name], MD      . lisinopril (PRINIVIL,ZESTRIL) tablet 40 mg  40 mg Oral Daily Algernon [name], MD   40 mg at 12/15/17 1252  . magnesium sulfate IVPB 2 g 50 mL  2 g Intravenous Daily PRN Algernon [name], MD      . metoprolol tartrate (LOPRESSOR) injection 2-5 mg  2-5 mg Intravenous Q2H PRN Algernon [name], MD      . metoprolol tartrate (LOPRESSOR) tablet 50 mg  50 mg Oral BID Algernon [name], MD      . morphine 2 MG/ML injection 2-5 mg  2-5 mg Intravenous Q1H PRN Algernon [name], MD   3 mg at 12/15/17 1251  . multivitamin with minerals tablet 1 tablet  1 tablet Oral Daily Algernon [name], MD   1 tablet at 12/15/17 1252  . nutrition supplement (JUVEN) (JUVEN) powder packet 1 packet  1 packet Oral BID BM Algernon [name], MD   1 packet at 12/15/17 1300  . omega-3 acid ethyl esters (LOVAZA) capsule 1,000 mg  1,000 mg Oral Daily Algernon [name], MD   1,000 mg at 12/15/17 1252  . ondansetron (ZOFRAN) injection 4 mg  4 mg Intravenous Q6H PRN Algernon [name], MD       . oxyCODONE-acetaminophen (PERCOCET/ROXICET) 5-325 MG per tablet 1-2 tablet  1-2 tablet Oral Q4H PRN Algernon [name], MD   2 tablet at 12/15/17 1201  . pentoxifylline (TRENTAL) CR tablet 400 mg  400 mg Oral TID WC Algernon [name], MD   400 mg at 12/15/17 1257  . phenol (  CHLORASEPTIC) mouth spray 1 spray  1 spray Mouth/Throat PRN [name], Erskine Squibb, MD      . polyethylene glycol (MIRALAX / GLYCOLAX) packet 17 g  17 g Oral Daily PRN Algernon [name], MD      . potassium chloride SA (K-DUR,KLOR-CON) CR tablet 20-40 mEq  20-40 mEq Oral Daily PRN Algernon [name], MD      . rosuvastatin (CRESTOR) tablet 40 mg  40 mg Oral Daily Algernon [name], MD   40 mg at 12/15/17 1252  . sorbitol 70 % solution 30 mL  30 mL Oral Daily PRN Algernon [name], MD         Discharge Medications: Please see discharge summary for a list of discharge medications.  Relevant Imaging Results:  Relevant Lab Results:   Additional Information SSN [id number]  Annamaria Boots, [place]

## 2017-12-15 NOTE — Evaluation (Signed)
Physical Therapy Evaluation Patient Details Name: Eric [name]. MRN: [id number] DOB: 1927/09/14 Today's Date: 12/15/2017   History of Present Illness  Patient is an 84 year old male admitted s/p L BKA following gangrene of the foot.  PMH includes PVD, hyperglycemia, persistent atrial fibrillation, NM disorder, DM, CKD III, cataracts, CAD and atrial flutter.  Clinical Impression  Patient is an 84 year old male who lives in a multi-story home with his wife.  He is generally active at baseline with occasional use of a SPC.  Pt is in bed upon PT arrival with L LE elevated and reports 9/10 pain.  He is able to perform bed mobility mod I and sit at EOB with min VC's for RLE placement.  Pt attempted a STS with RW and achieved partial range without assistance but required max A to stand upright.  Pt able to complete sliding board transfer bed to chair with mod A for education concerning proper use of sliding board and safety.  PT assisted pt in positioning L LE and answered pt's and family's questions regarding rehabilitation going forward.  Pt is eager to work with PT and appears to be in good spirits.  Pt will continue to benefit from skilled PT with focus on strength, functional mobility, use of AD and balance.    Follow Up Recommendations SNF    Equipment Recommendations  (To be determined at next venue of care.)    Recommendations for Other Services       Precautions / Restrictions Precautions Precautions: Fall Restrictions Weight Bearing Restrictions: Yes      Mobility  Bed Mobility Overal bed mobility: Modified Independent             General bed mobility comments: Pt performed bed mobility slowly but refused PT's hand when offered.  Transfers Overall transfer level: Needs assistance Equipment used: Rolling walker (2 wheeled)(Sliding board) Transfers: Sit to/from Stand;Lateral/Scoot Transfers Sit to Stand: Max assist        Lateral/Scoot Transfers: Mod assist General  transfer comment: Performed partial STS but was unable to produce enough R LE strength to complete transfer without Max A.  Also unable to use RW properly due to struggle.  Pt was able to complete sliding board transfer bed to chair with mod A for education regarding hand placement and use of sliding board as well as some physical assist for safe positioning.  Ambulation/Gait                Stairs            Wheelchair Mobility    Modified Rankin (Stroke Patients Only)       Balance Overall balance assessment: Needs assistance Sitting-balance support: Feet supported   Sitting balance - Comments: Able to balance with unilateral foot support on floor and no UE   Standing balance support: Bilateral upper extremity supported                                 Pertinent Vitals/Pain Pain Assessment: 0-10 Pain Score: 9  Pain Location: Area of surgical site L LE Pain Intervention(s): Limited activity within patient's tolerance;Monitored during session;Premedicated before session    Home Living Family/patient expects to be discharged to:: Skilled nursing facility Living Arrangements: Spouse/significant other                    Prior Function Level of Independence: Independent with assistive device(s)  Comments: Used a cane intermittently. Has a lift chair for use of multiple steps to basement.     Hand Dominance        Extremity/Trunk Assessment   Upper Extremity Assessment Upper Extremity Assessment: Overall WFL for tasks assessed    Lower Extremity Assessment Lower Extremity Assessment: Generalized weakness    Cervical / Trunk Assessment Cervical / Trunk Assessment: Normal  Communication   Communication: No difficulties  Cognition Arousal/Alertness: Awake/alert Behavior During Therapy: WFL for tasks assessed/performed Overall Cognitive Status: Within Functional Limits for tasks assessed                                  General Comments: Pt A&O x4, pleasant and eager to work with PT.      General Comments      Exercises Other Exercises Other Exercises: Educated regarding positioning of L LE and answered questions regarding rehabilitation timelines and what to expect.   Assessment/Plan    PT Assessment Patient needs continued PT services  PT Problem List Decreased strength;Decreased mobility;Decreased balance;Decreased knowledge of use of DME       PT Treatment Interventions DME instruction;Therapeutic activities;Gait training;Therapeutic exercise;Balance training;Functional mobility training;Neuromuscular re-education;Patient/family education;Wheelchair mobility training    PT Goals (Current goals can be found in the Care Plan section)  Acute Rehab PT Goals Patient Stated Goal: To eventually return home and be as mobile as possible. PT Goal Formulation: With patient Time For Goal Achievement: 12/29/17 Potential to Achieve Goals: Good    Frequency 7X/week   Barriers to discharge        Co-evaluation               AM-PAC PT "6 Clicks" Daily Activity  Outcome Measure Difficulty turning over in bed (including adjusting bedclothes, sheets and blankets)?: A Little Difficulty moving from lying on back to sitting on the side of the bed? : A Little Difficulty sitting down on and standing up from a chair with arms (e.g., wheelchair, bedside commode, etc,.)?: A Little Help needed moving to and from a bed to chair (including a wheelchair)?: A Lot Help needed walking in hospital room?: A Lot Help needed climbing 3-5 steps with a railing? : A Lot 6 Click Score: 15    End of Session Equipment Utilized During Treatment: Gait belt Activity Tolerance: Patient tolerated treatment well Patient left: in chair;with call bell/phone within reach;with chair alarm set Nurse Communication: Mobility status PT Visit Diagnosis: Unsteadiness on feet (R26.81);Muscle weakness (generalized)  (M62.81)    Time: 1340-1405 PT Time Calculation (min) (ACUTE ONLY): 25 min   Charges:   PT Evaluation $PT Eval Moderate Complexity: 1 Mod PT Treatments $Therapeutic Activity: 8-22 mins   PT G Codes:   PT G-Codes **NOT FOR INPATIENT CLASS** Functional Assessment Tool Used: AM-PAC 6 Clicks Basic Mobility    Roxanne Gates, PT, DPT   Roxanne Gates 12/15/2017, 2:20 PM

## 2017-12-15 NOTE — Progress Notes (Signed)
Patient with CrCl < 90ml/min with famotidine IV ordered 20mg  IV q12h. Per [facility] protocol dose has been adjusted to 20mg  IV q24h.

## 2017-12-15 NOTE — Op Note (Signed)
   OPERATIVE NOTE   PROCEDURE: Left below-the-knee amputation  PRE-OPERATIVE DIAGNOSIS: Left foot gangrene  POST-OPERATIVE DIAGNOSIS: same as above  SURGEON: Leotis Pain, MD  ASSISTANT(S): none  ANESTHESIA: general  ESTIMATED BLOOD LOSS: 150 cc  FINDING(S): none  SPECIMEN(S):  Left below-the-knee amputation  INDICATIONS:   Eric [name]. is a 84 y.o. male who presents with left leg gangrene.  The patient is scheduled for a left below-the-knee amputation.  I discussed in depth with the patient the risks, benefits, and alternatives to this procedure.  The patient is aware that the risk of this operation included but are not limited to:  bleeding, infection, myocardial infarction, stroke, death, failure to heal amputation wound, and possible need for more proximal amputation.  The patient is aware of the risks and agrees proceed forward with the procedure.  DESCRIPTION:  After full informed written consent was obtained from the patient, the patient was brought back to the operating room, and placed supine upon the operating table.  Prior to induction, the patient received IV antibiotics.  The patient was then prepped and draped in the standard fashion for a below-the-knee amputation.  After obtaining adequate anesthesia, the patient was prepped and draped in the standard fashion for a left below-the-knee amputation.  I marked out the anterior incision two finger breadths below the tibial tuberosity and then the marked out a posterior flap that was one third of the circumference of the calf in length.   I made the incisions for these flaps, and then dissected through the subcutaneous tissue, fascia, and muscle anteriorly.  I elevated  the periosteal tissue superiorly so that the tibia was about 3-4 cm shorter than the anterior skin flap.  I then transected the tibia with a power saw and then took a wedge off the tibia anteriorly with the power saw.  Then I smoothed out the rough edges.  In  a similar fashion, I cut back the fibula about two centimeters higher than the level of the tibia with a bone cutter.  I put a bone hook into the distal tibia and then used a large amputation knife to sharply develop a tissue plane through the muscle along the fibula.  In such fashion, the posterior flap was developed.  At this point, the specimen was passed off the field as the below-the-knee amputation.  At this point, I clamped all visibly bleeding arteries and veins using a combination of suture ligation with Silk suture and electrocautery.  Bleeding continued to be controlled with electrocautery and suture ligature.  The stump was washed off with sterile normal saline and no further active bleeding was noted.  I reapproximated the anterior and posterior fascia  with interrupted stitches of 0 Vicryl.  This was completed along the entire length of anterior and posterior fascia until there were no more loose space in the fascial line. I then placed a layer of 2-0 Vicryl sutures in the subcutaneous tissue. The skin was then  reapproximated with staples.  The stump was washed off and dried.  The incision was dressed with Xeroform and  then fluffs were applied.  Kerlix was wrapped around the leg and then gently an ACE wrap was applied.    COMPLICATIONS: none  CONDITION: stable   Leotis Pain  12/15/2017, 8:57 AM    This note was created with Dragon Medical transcription system. Any errors in dictation are purely unintentional.

## 2017-12-15 NOTE — Anesthesia Post-op Follow-up Note (Signed)
Anesthesia QCDR form completed.        

## 2017-12-15 NOTE — Anesthesia Postprocedure Evaluation (Signed)
Anesthesia Post Note  Patient: Eric [name].  Procedure(s) Performed: AMPUTATION BELOW KNEE (Left Leg Lower)  Patient location during evaluation: PACU Anesthesia Type: General Level of consciousness: awake and alert and oriented Pain management: pain level controlled Vital Signs Assessment: post-procedure vital signs reviewed and stable Respiratory status: spontaneous breathing, nonlabored ventilation and respiratory function stable Cardiovascular status: blood pressure returned to baseline and stable Postop Assessment: no signs of nausea or vomiting Anesthetic complications: no     Last Vitals:  Vitals:   12/15/17 1000 12/15/17 1020  BP: 125/62 (!) 125/58  Pulse: 62 (!) 58  Resp: 15 16  Temp: 36.6 C 36.7 C  SpO2: 100% 100%    Last Pain:  Vitals:   12/15/17 1020  TempSrc: Oral  PainSc:                  Laycee Fitzsimmons

## 2017-12-15 NOTE — Progress Notes (Signed)
PHARMACIST - PHYSICIAN ORDER COMMUNICATION  CONCERNING: P&T Medication Policy on Herbal Medications  DESCRIPTION:  This patient's order for:  cinnamon  has been noted.  This product(s) is classified as an "herbal" or natural product. Due to a lack of definitive safety studies or FDA approval, nonstandard manufacturing practices, plus the potential risk of unknown drug-drug interactions while on inpatient medications, the Pharmacy and Therapeutics Committee does not permit the use of "herbal" or natural products of this type within [facility].   ACTION TAKEN: The pharmacy department is unable to verify this order at this time.  Please reevaluate patient's clinical condition at discharge and address if the herbal or natural product(s) should be resumed at that time.

## 2017-12-16 ENCOUNTER — Encounter: Payer: Self-pay | Admitting: Vascular Surgery

## 2017-12-16 LAB — CBC
HEMATOCRIT: 30.2 % — AB (ref 40.0–52.0)
Hemoglobin: 10.3 g/dL — ABNORMAL LOW (ref 13.0–18.0)
MCH: 31.8 pg (ref 26.0–34.0)
MCHC: 34 g/dL (ref 32.0–36.0)
MCV: 93.4 fL (ref 80.0–100.0)
Platelets: 230 10*3/uL (ref 150–440)
RBC: 3.23 MIL/uL — ABNORMAL LOW (ref 4.40–5.90)
RDW: 15.7 % — AB (ref 11.5–14.5)
WBC: 9.3 10*3/uL (ref 3.8–10.6)

## 2017-12-16 LAB — BASIC METABOLIC PANEL
Anion gap: 5 (ref 5–15)
BUN: 26 mg/dL — AB (ref 6–20)
CHLORIDE: 106 mmol/L (ref 101–111)
CO2: 22 mmol/L (ref 22–32)
CREATININE: 1.25 mg/dL — AB (ref 0.61–1.24)
Calcium: 7.9 mg/dL — ABNORMAL LOW (ref 8.9–10.3)
GFR calc Af Amer: 58 mL/min — ABNORMAL LOW (ref >60)
GFR calc non Af Amer: 50 mL/min — ABNORMAL LOW (ref >60)
Glucose, Bld: 131 mg/dL — ABNORMAL HIGH (ref 65–99)
POTASSIUM: 4.5 mmol/L (ref 3.5–5.1)
SODIUM: 133 mmol/L — AB (ref 135–145)

## 2017-12-16 MED ORDER — VANCOMYCIN 50 MG/ML ORAL SOLUTION
125.0000 mg | Freq: Four times a day (QID) | ORAL | Status: DC
Start: 2017-12-16 — End: 2017-12-21
  Administered 2017-12-16 – 2017-12-21 (×20): 125 mg via ORAL
  Filled 2017-12-16 (×23): qty 2.5

## 2017-12-16 NOTE — Progress Notes (Signed)
Pt alarm went off and as soon as I was going to room I saw pt sitting on side of bed saw him stand up and I ran in. Pt fell on his right knee and left stump. Did not hit no other part of his body. Called for help. Team assisted pt to sit on bottom to use lift to place back in bed. rn for this pt in room and post fall instructions followed and family notified.

## 2017-12-16 NOTE — Consult Note (Signed)
Eric Bergeson [name] ZHG:[id number] DOB: Dec 26, 1927 DOA: 12/15/2017 PCP: [name], MD   Requesting physician: Dr [name] Date of consultation: 12/16/2017 Reason for consultation: C. difficile colitis  CHIEF COMPLAINT: Diarrhea infectious  HISTORY OF PRESENT ILLNESS: Eric [name]  is a 84 y.o. male with a known history of atrial flutter, coronary artery disease, chronic kidney disease stage III, diabetes mellitus diet-controlled, sleep apnea, peripheral vascular disease currently under vascular surgery service.  Patient has left lower extremity gangrene and underwent left below-knee amputation patient tolerated procedure well.. He had watery diarrhea for couple of days prior to admission.  Patient completed a course of antibiotics as outpatient prior to getting admitted to the hospital and after finishing antibiotics he had this watery diarrhea which was foul-smelling.  Stool was tested by primary care physician which showed C. difficile toxin.  Hospitalist service was consulted for diarrhea.  No new episodes of diarrhea in the hospital.  Tolerating diet well.  No abdominal pain, no nausea and vomiting.  PAST MEDICAL HISTORY:   Past Medical History:  Diagnosis Date  . Atrial flutter ([place])    s/p RFCA  . CAD (coronary artery disease)    cath 2003, occluded S-RCA, occluded S-Dx, L-LAD ok, s/p PTCA to LAD  . Cataract   . CKD (chronic kidney disease) stage 3, GFR 30-59 ml/min (H[facility])   . Diabetes mellitus    diet controlled  . Hearing aid worn    bilateral  . History of kidney stones   . Long term (current) use of anticoagulants   . Neuromuscular disorder ([place])   . OSA (obstructive sleep apnea) 12/11   very mild, AHI 7/hr  . Persistent atrial fibrillation ([place]) 09/26/2015  . Pure hyperglyceridemia   . PVD (peripheral vascular disease) ([place])    angioplasty of his right lower extremity in [place] by Dr.[name] 2013  . Unspecified essential hypertension    . Wears dentures    full upper and lower    PAST SURGICAL HISTORY:  Past Surgical History:  Procedure Laterality Date  . ABDOMINAL AORTOGRAM W/LOWER EXTREMITY N/A 11/15/2017   Procedure: ABDOMINAL AORTOGRAM W/LOWER EXTREMITY;  Surgeon: [name], MD;  Location: [facility];  Service: Cardiovascular;  Laterality: N/A;  . Adenosine Myoview  3/06   EF 56%, neg. Ischemia  . Adenosine Myoview  02/18/07   nml  . AMPUTATION Left 12/15/2017   Procedure: AMPUTATION BELOW KNEE;  Surgeon: Algernon [name], MD;  Location: [facility];  Service: Vascular;  Laterality: Left;  . ANGIOPLASTY  1/99   CAD- diogonal with rotational artherectomy  . Arthrectomy     of LAD & PTCA  . BLEPHAROPLASTY Bilateral   . CARDIAC CATHETERIZATION  1/00  . CARDIOVERSION  1/04  . CARDIOVERSION  5/07   hospital- a flutter  . CARPAL TUNNEL RELEASE     ? bilateral  . CATARACT EXTRACTION W/PHACO Right 07/28/2017   Procedure: CATARACT EXTRACTION PHACO AND INTRAOCULAR LENS PLACEMENT ([place]) RIGHT DIABETIC;  Surgeon: [name], MD;  Location: [facility];  Service: Ophthalmology;  Laterality: Right;  Diabetic - diet controlled  . CATARACT EXTRACTION W/PHACO Left 08/18/2017   Procedure: CATARACT EXTRACTION PHACO AND INTRAOCULAR LENS PLACEMENT ([facility]);  Surgeon: [name], MD;  Location: [place];  Service: Ophthalmology;  Laterality: Left;  DIABETES - oral meds  . COLONOSCOPY W/ BIOPSIES  10/01/06   sigmoid polyp bx neg, 3 years  . CORONARY ANGIOPLASTY  4/03   cutting  balloon PTCA pLAD into Diag  . CORONARY ARTERY BYPASS GRAFT  2000   LIMA-LAD, SVG-RCA, SVG-Diag; SVG-Diag & SVG-RCA occluded 2003  . FRACTURE SURGERY    . HAND SURGERY  08/27/09   R thumb procedure wit Scaphoid Gragt and screws, Dr [name]  . NM MYOVIEW LTD  4/11   normal  . PERIPHERAL VASCULAR CATHETERIZATION Right 06/27/2015   Procedure: Lower Extremity Angiography;  Surgeon: Algernon [name], MD;  Location: [facility];  Service: Cardiovascular;  Laterality: Right;  . PERIPHERAL VASCULAR CATHETERIZATION  06/27/2015   Procedure: Lower Extremity Intervention;  Surgeon: Algernon [name], MD;  Location: [facility];  Service: Cardiovascular;;    SOCIAL HISTORY:  Social History   Tobacco Use  . Smoking status: Never Smoker  . Smokeless tobacco: Former Network engineer Use Topics  . Alcohol use: No    FAMILY HISTORY:  Family History  Problem Relation Age of Onset  . Stroke Father   . Aneurysm Father   . Hypertension Father   . Prostate cancer Brother   . Hypertension Mother   . Lung cancer Brother        smoker  . Thrombosis Brother   . Other Brother        RF valve disorder (smoker)  . Lung cancer Brother        smoker  . Breast cancer Sister   . Liver cancer Brother   . Other Sister        cerebral hemorrhage    DRUG ALLERGIES:  Allergies  Allergen Reactions  . Isosorbide Mononitrate Other (See Comments)    Unknown, doesn't remember   . Ramipril Cough  . Quinidine Gluconate Rash    REVIEW OF SYSTEMS:   CONSTITUTIONAL: No fever, fatigue or weakness.  EYES: No blurred or double vision.  EARS, NOSE, AND THROAT: No tinnitus or ear pain.  RESPIRATORY: No cough, shortness of breath, wheezing or hemoptysis.  CARDIOVASCULAR: No chest pain, orthopnea, edema.  GASTROINTESTINAL: No nausea, vomiting, diarrhea or abdominal pain.  GENITOURINARY: No dysuria, hematuria.  ENDOCRINE: No polyuria, nocturia,  HEMATOLOGY: No anemia, easy bruising or bleeding SKIN: No rash or lesion. MUSCULOSKELETAL: Left lower extremity pain NEUROLOGIC: No tingling, numbness, weakness.  PSYCHIATRY: No anxiety or depression.   MEDICATIONS AT HOME:  Prior to Admission medications   Medication Sig Start Date End Date Taking? Authorizing Provider  acetaminophen (TYLENOL) 500 MG tablet Take 1,000 mg by mouth every 8 (eight) hours as needed for mild pain.    Yes [provider]   amLODipine (NORVASC) 10 MG tablet Take 1 tablet (10 mg total) by mouth daily. 10/11/17  Yes [name], MD  apixaban (ELIQUIS) 2.5 MG TABS tablet Take 1 tablet (2.5 mg total) by mouth 2 (two) times daily. 11/05/17  Yes [name], MD  B Complex Vitamins (B COMPLEX-B12) TABS Take 1 tablet by mouth daily.    Yes [provider]  Blood Glucose Monitoring Suppl (ONE TOUCH ULTRA SYSTEM KIT) W/DEVICE KIT 1 kit by Does not apply route once. 11/01/14  Yes [name], MD  calcium carbonate (TUMS - DOSED IN MG ELEMENTAL CALCIUM) 500 MG chewable tablet Chew 1 tablet by mouth daily as needed for indigestion or heartburn.   Yes [provider]  cholecalciferol (VITAMIN D) 1000 UNITS tablet Take 1,000 Units by mouth 2 (two) times a week. Takes on Mon and Fri   Yes [provider]  CINNAMON PO Take 1,000 mg  by mouth 2 (two) times daily.    Yes [provider]  fenofibrate 160 MG tablet Take 1 tablet (160 mg total) by mouth daily. 04/23/17  Yes [name], MD  fish oil-omega-3 fatty acids 1000 MG capsule Take 1,000 mg by mouth daily.    Yes [provider]  glucose blood test strip Use as instructed 11/25/17  Yes Pickard, Cammie Mcgee, MD  hydrochlorothiazide (HYDRODIURIL) 25 MG tablet Take 1 tablet (25 mg total) by mouth daily. 11/01/17  Yes [name], MD  lisinopril (PRINIVIL,ZESTRIL) 40 MG tablet Take 1 tablet (40 mg total) by mouth daily. 11/29/17  Yes [name], MD  metoprolol tartrate (LOPRESSOR) 50 MG tablet Take 1 tablet (50 mg total) by mouth 2 (two) times daily. 11/29/17  Yes [name], MD  Multiple Vitamin (DAILY MULTIVITAMIN PO) Take 1 tablet by mouth daily.     Yes [provider]  nutrition supplement, JUVEN, (JUVEN) PACK Take 1 packet by mouth 2 (two) times daily between meals. 11/16/17  Yes Mikhail, [place], DO  [facility] DELICA LANCETS FINE MISC check sugar once daily 08/17/17  Yes [name], MD   pentoxifylline (TRENTAL) 400 MG CR tablet Take 1 tablet (400 mg total) by mouth 3 (three) times daily with meals. 11/24/17  Yes Newt Minion, MD  rosuvastatin (CRESTOR) 40 MG tablet Take 1 tablet (40 mg total) by mouth daily. 08/20/17  Yes [name], MD  amoxicillin-clavulanate (AUGMENTIN) 875-125 MG tablet Take 1 tablet by mouth 2 (two) times daily. Patient not taking: Reported on 12/15/2017 11/29/17   [name], MD  nitroGLYCERIN (NITRODUR - DOSED IN MG/24 HR) 0.2 mg/hr patch Place 1 patch (0.2 mg total) onto the skin daily. Patient not taking: Reported on 12/15/2017 11/24/17   Newt Minion, MD      PHYSICAL EXAMINATION:   VITAL SIGNS: Blood pressure 113/81, pulse (!) 52, temperature 97.9 F (36.6 C), temperature source Oral, resp. rate 20, height _0  (1.651 m), weight 78.8 kg (173 lb 11.6 oz), SpO2 98 %.  GENERAL:  84 y.o.-year-old patient lying in the bed with no acute distress.  EYES: Pupils equal, round, reactive to light and accommodation. No scleral icterus. Extraocular muscles intact.  HEENT: Head atraumatic, normocephalic. Oropharynx and nasopharynx clear.  NECK:  Supple, no jugular venous distention. No thyroid enlargement, no tenderness.  LUNGS: Normal breath sounds bilaterally, no wheezing, rales,rhonchi or crepitation. No use of accessory muscles of respiration.  CARDIOVASCULAR: S1, S2 normal. No murmurs, rubs, or gallops.  ABDOMEN: Soft, nontender, nondistended. Bowel sounds present. No organomegaly or mass.  EXTREMITIES: No pedal edema right lower extremity Has left BKA Has bandage to stump NEUROLOGIC: Cranial nerves II through XII are intact. Muscle strength 5/5 in all extremities. Sensation intact. Gait not checked.  PSYCHIATRIC: The patient is alert and oriented x 3.  SKIN: No obvious rash, lesion, or ulcer.   LABORATORY PANEL:   CBC Recent Labs  Lab 12/10/17 1038 12/15/17 1052 12/16/17 0420  WBC 12.4* 7.4 9.3  HGB 11.8* 10.9* 10.3*  HCT 34.8*  32.5* 30.2*  PLT 264 232 230  MCV 91.1 92.4 93.4  MCH 30.9 31.0 31.8  MCHC 33.9 33.5 34.0  RDW 13.6 15.4* 15.7*  LYMPHSABS 3,112  --   --   EOSABS 818*  --   --   BASOSABS 124  --   --    ------------------------------------------------------------------------------------------------------------------  Chemistries  Recent Labs  Lab 12/10/17 1038 12/15/17 1052 12/16/17  0420  NA 131*  --  133*  K 4.2  --  4.5  CL 99  --  106  CO2 26  --  22  GLUCOSE 128*  --  131*  BUN 29*  --  26*  CREATININE 1.44* 1.46* 1.25*  CALCIUM 9.8  --  7.9*  AST 29  --   --   ALT 16  --   --   BILITOT 0.6  --   --    ------------------------------------------------------------------------------------------------------------------ estimated creatinine clearance is 40.3 mL/min (A) (by C-G formula based on SCr of 1.25 mg/dL (H)). ------------------------------------------------------------------------------------------------------------------ No results for input(s): TSH, T4TOTAL, T3FREE, THYROIDAB in the last 72 hours.  Invalid input(s): FREET3   Coagulation profile Recent Labs  Lab 12/13/17 0839 12/15/17 0630  INR 1.46 1.11   ------------------------------------------------------------------------------------------------------------------- No results for input(s): DDIMER in the last 72 hours. -------------------------------------------------------------------------------------------------------------------  Cardiac Enzymes No results for input(s): CKMB, TROPONINI, MYOGLOBIN in the last 168 hours.  Invalid input(s): CK ------------------------------------------------------------------------------------------------------------------ Invalid input(s): POCBNP  ---------------------------------------------------------------------------------------------------------------  Urinalysis    Component Value Date/Time   COLORURINE YELLOW 11/10/2017 [place] 11/10/2017 1545    LABSPEC 1.009 11/10/2017 1545   PHURINE 6.0 11/10/2017 1545   GLUCOSEU NEGATIVE 11/10/2017 1545   HGBUR NEGATIVE 11/10/2017 1545   HGBUR trace-lysed 02/07/2008 1101   BILIRUBINUR NEGATIVE 11/10/2017 1545   KETONESUR NEGATIVE 11/10/2017 1545   PROTEINUR NEGATIVE 11/10/2017 1545   UROBILINOGEN 0.2 02/07/2008 1101   NITRITE NEGATIVE 11/10/2017 1545   LEUKOCYTESUR NEGATIVE 11/10/2017 1545     RADIOLOGY: No results found.  EKG: Orders placed or performed during the hospital encounter of 12/13/17  . EKG test  . EKG test    IMPRESSION AND PLAN: Eric [name]  is a 84 y.o. male with a known history of atrial flutter, coronary artery disease, chronic kidney disease stage III, diabetes mellitus diet-controlled, sleep apnea, peripheral vascular disease currently under vascular surgery service.  -C. difficile toxigenic colitis Continue oral vancomycin 125 mg every 6 hourly for 14 days Enteric precautions  -Hyponatremia IV fluid hydration Monitor electrolytes  -Dry gangrene left lower extremity Status post left below-knee amputation Vascular surgery follow-up and wound care management Daily wound dressings  -DVT prophylaxis with oral Eliquis  -Diet controlled diabetes mellitus  consistent carb diet  -Atrial flutter Continue metoprolol for rate control  -Thank you for consultation We will follow patient closely   All the records are reviewed and case discussed with ED provider. Management plans discussed with the patient, family and they are in agreement.  CODE STATUS:Full code    Code Status Orders  (From admission, onward)        Start     Ordered   12/15/17 1045  Full code  Continuous     12/15/17 1044    Code Status History    Date Active Date Inactive Code Status Order ID Comments User Context   11/10/2017 1737 11/16/2017 1744 Full Code [id number]  [name], MD Inpatient   06/27/2015 1123 06/27/2015 1813 Full Code [id number]  Algernon [name], MD Inpatient        TOTAL TIME TAKING CARE OF THIS PATIENT: 52 minutes.    Saundra Shelling M.D on 12/16/2017 at 2:44 PM  Between 7am to 6pm - Pager - [phone]  After 6pm go to [email or web address] - password [email or web address] [facility]  [phone]  CC: Primary care physician; [name], MD

## 2017-12-16 NOTE — Progress Notes (Signed)
Encouraged frequent po intake, acknowledged; Attempted to void X 2; unsuccessful; bladder scanned >750 ml; [name] paged; awaiting callback; Barbaraann Faster, RN 6:57 AM 12/16/2017

## 2017-12-16 NOTE — Evaluation (Signed)
Occupational Therapy Evaluation Patient Details Name: Eric [name]. MRN: [id number] DOB: [birth year] Today's Date: 12/16/2017    History of Present Illness Patient is an 84 year old male admitted s/p L BKA following gangrene of the foot.  PMH includes PVD, hyperglycemia, persistent atrial fibrillation, NM disorder, DM, CKD III, cataracts, CAD and atrial flutter.   Clinical Impression   Pt seen for OT evaluation this date. Prior to hospital admission, pt was independent with ADL, driving, and intermittently using a SPC for mobility.  Pt lives in a 2 story home (main floor and walk-up basement) with a chair lift for stairs from main floor to basement.  Currently pt demonstrates impairments in strength, edema in residual limb, decreased knowledge of AE/DME, and now NWBing on LLE secondary to the BKA, significantly impairing pt's functional mobility requiring min assist for LB ADL from bed level or sitting EOB and for functional transfers and OOB mobility. Pt educated in positioning of LLE and desensitization strategies to support pain management and maximize potentional for possible prosthesis in the future. Pt also educated in risk of contracture with knee in flexion. Pt would benefit from skilled OT to address noted impairments and functional limitations (see below for any additional details) in order to maximize safety and independence while minimizing falls risk and caregiver burden while recovering from recent amputation. Upon hospital discharge, recommend pt discharge to SNF for further rehab.    Follow Up Recommendations  SNF    Equipment Recommendations  3 in 1 bedside commode(3:1 with drop arm; likely require a wheelchair)    Recommendations for Other Services       Precautions / Restrictions Precautions Precautions: Fall Precaution Comments: enteric Restrictions Weight Bearing Restrictions: Yes LLE Weight Bearing: Non weight bearing      Mobility Bed Mobility Overal bed  mobility: Modified Independent             General bed mobility comments: additional time/effort  Transfers                      Balance Overall balance assessment: Needs assistance Sitting-balance support: Feet supported;Feet unsupported Sitting balance-Leahy Scale: Good                                     ADL either performed or assessed with clinical judgement   ADL Overall ADL's : Needs assistance/impaired Eating/Feeding: Sitting;Independent   Grooming: Sitting;Independent   Upper Body Bathing: Sitting;Supervision/ safety;Set up   Lower Body Bathing: Sitting/lateral leans;Min guard;Set up   Upper Body Dressing : Sitting;Set up;Supervision/safety   Lower Body Dressing: Minimal assistance;Sitting/lateral leans   Toilet Transfer: BSC;Transfer board;Moderate assistance;Cueing for sequencing   Toileting- Clothing Manipulation and Hygiene: Min guard;Sitting/lateral lean               Vision Patient Visual Report: No change from baseline       Perception     Praxis      Pertinent Vitals/Pain Pain Assessment: 0-10 Pain Score: 1  Pain Location: Area of surgical site L LE Pain Descriptors / Indicators: Aching Pain Intervention(s): Limited activity within patient's tolerance;Monitored during session     Hand Dominance Right(has become more left handed due to decreased sensation/light touch in R hand)   Extremity/Trunk Assessment Upper Extremity Assessment Upper Extremity Assessment: Overall WFL for tasks assessed(grossly 4+/5 bilaterally, decreased sensation/light touch on R hand but no difficulty holding heavier objects in  R hand)   Lower Extremity Assessment Lower Extremity Assessment: Overall WFL for tasks assessed(L BKA; RLE grossly 4+/5 )   Cervical / Trunk Assessment Cervical / Trunk Assessment: Normal   Communication Communication Communication: No difficulties   Cognition Arousal/Alertness: Awake/alert Behavior During  Therapy: WFL for tasks assessed/performed Overall Cognitive Status: Within Functional Limits for tasks assessed                                     General Comments       Exercises Other Exercises Other Exercises: Pt educated in positioning of LLE and desensitization strategies to support pain management and maximize potentional for possible prosthesis in the future Other Exercises: Pt instructed in importance of keeping L knee straight to minimize risk of contracture   Shoulder Instructions      Home Living Family/patient expects to be discharged to:: Skilled nursing facility Living Arrangements: Spouse/significant other                               Additional Comments: level entry walk in basement with chair lift to main floor.       Prior Functioning/Environment Level of Independence: Independent with assistive device(s)        Comments: Used a cane or walking stick intermittently when out walking. Has a lift chair for use of multiple steps to basement. Indep with ADL, med mgt. Pt is the primary driver, but spouse does drive        OT Problem List: Decreased strength;Decreased knowledge of use of DME or AE;Increased edema;Decreased range of motion;Decreased knowledge of precautions;Impaired balance (sitting and/or standing)      OT Treatment/Interventions: Self-care/ADL training;Balance training;Therapeutic exercise;Therapeutic activities;DME and/or AE instruction;Patient/family education;Manual therapy    OT Goals(Current goals can be found in the care plan section) Acute Rehab OT Goals Patient Stated Goal: To eventually return home and be as mobile as possible. OT Goal Formulation: With patient Time For Goal Achievement: 12/30/17 Potential to Achieve Goals: Good ADL Goals Pt Will Perform Lower Body Dressing: with supervision;with set-up;sitting/lateral leans Pt Will Transfer to Toilet: with min guard assist;bedside commode;with transfer  board Additional ADL Goal #1: Pt will return demo proper techniques for desensitization of residual limb and implement into daily routines.  OT Frequency: Min 1X/week   Barriers to D/C:            Co-evaluation              AM-PAC PT "6 Clicks" Daily Activity     Outcome Measure Help from another person eating meals?: None Help from another person taking care of personal grooming?: None Help from another person toileting, which includes using toliet, bedpan, or urinal?: A Lot Help from another person bathing (including washing, rinsing, drying)?: A Lot Help from another person to put on and taking off regular upper body clothing?: A Little Help from another person to put on and taking off regular lower body clothing?: A Little 6 Click Score: 18   End of Session    Activity Tolerance: Patient tolerated treatment well Patient left: in bed;with call bell/phone within reach;with bed alarm set;Other (comment)(LLE elevated)  OT Visit Diagnosis: Other abnormalities of gait and mobility (R26.89);Muscle weakness (generalized) (M62.81)                Time: 1517-6160 OT Time Calculation (min): 24 min Charges:  OT General Charges $OT Visit: 1 Visit OT Evaluation $OT Eval Moderate Complexity: 1 Mod OT Treatments $Self Care/Home Management : 8-22 mins  Jeni Salles, MPH, MS, OTR/L ascom [phone] 12/16/17, 10:14 AM

## 2017-12-16 NOTE — Progress Notes (Signed)
2205: Patient fall.  Patient stated that he was getting up because "..I didn't want to mess on myself..." Patient didn't call out for assistance to [facility], got up onto side of bed, alarm went off, staff responded immediately, patient then stood up and immediately fell onto right knee and grabbed end of bed; incontinent of loose stool (enteric precautions in place);   2215: assisted back to bed with use of hoyer lift and multiple staff members; right knee pink in color w/o skin tear or hematoma; VSS; peri care and foley care given; bed alarm reengaged. [name] paged (on call). 2220: [name] paged. Morphine given for Left BKA pain, no bleeding noted on Left BKA dsg. Left stump elevated on pilllow. 2230: [name] cell phone called, no answer, just rang.  Patient's wife, [name], notified of fall, thanked staff for the notification. 2244: [name] home phone called, disconnected. 2300: Tele-sitter initiated for fall prevention. Patient stated that his stump was "easing up". 2313: Dr. Duane Boston notified of patient fall (consulted with Internal Medicine/Hospitalist). Awaiting callback from [name] (on call MD). 2325: Supervisor, [name] notified of Dr. Duane Boston notification and no callback from [name]. Barbaraann Faster, RN 11:35 PM 12/16/2017

## 2017-12-16 NOTE — Progress Notes (Signed)
[facility]  Daily Progress Note   Subjective  - 1 Day Post-Op  Doing quite well Working with PT.  Pain is well controlled No major events overnight  Objective Vitals:   12/15/17 2033 12/15/17 2125 12/16/17 0448 12/16/17 1021  BP: (!) 118/51  132/61 113/81  Pulse: (!) 110 (!) 54 60 (!) 52  Resp: 18  20   Temp: (!) 97.5 F (36.4 C)  97.9 F (36.6 C)   TempSrc: Oral  Oral   SpO2: 99%  98%   Weight:      Height:        Intake/Output Summary (Last 24 hours) at 12/16/2017 1627 Last data filed at 12/16/2017 1450 Gross per 24 hour  Intake 1998.5 ml  Output 1400 ml  Net 598.5 ml    PULM  CTAB CV  RRR VASC  Dressing left BKA C/D/I  Laboratory CBC    Component Value Date/Time   WBC 9.3 12/16/2017 0420   HGB 10.3 (L) 12/16/2017 0420   HCT 30.2 (L) 12/16/2017 0420   PLT 230 12/16/2017 0420    BMET    Component Value Date/Time   NA 133 (L) 12/16/2017 0420   NA 139 04/28/2012 0658   K 4.5 12/16/2017 0420   K 4.2 04/28/2012 0658   CL 106 12/16/2017 0420   CL 106 04/28/2012 0658   CO2 22 12/16/2017 0420   CO2 25 04/28/2012 0658   GLUCOSE 131 (H) 12/16/2017 0420   GLUCOSE 114 (H) 04/28/2012 0658   BUN 26 (H) 12/16/2017 0420   BUN 32 (H) 04/28/2012 0658   CREATININE 1.25 (H) 12/16/2017 0420   CREATININE 1.44 (H) 12/10/2017 1038   CALCIUM 7.9 (L) 12/16/2017 0420   CALCIUM 8.9 04/28/2012 0658   GFRNONAA 50 (L) 12/16/2017 0420   GFRNONAA 43 (L) 12/10/2017 1038   GFRAA 58 (L) 12/16/2017 0420   GFRAA 50 (L) 12/10/2017 1038    Assessment/Planning: POD #1 s/p left BKA   Doing quite well  Care managers looking for facility  Plan remove OR dressing tomorrow and replacing wrap.  Oral vanc for C. Diff positive as an outpatient  Plan discharge to rehab facility tomorrow.    Leotis Pain  12/16/2017, 4:27 PM

## 2017-12-16 NOTE — Clinical Social Work Note (Addendum)
Clinical Social Work Assessment  Patient Details  Name: Eric [name]. MRN: [id number] Date of Birth: [birth year]  Date of referral:  12/16/17               Reason for consult:  Facility Placement                Permission sought to share information with:  Facility Sport and exercise psychologist, Tourist information centre manager, Family Supports Permission granted to share information::  Yes, Verbal Permission Granted  Name::      SNF   Agency::   [place]   Relationship::     Contact Information:     Housing/Transportation Living arrangements for the past 2 months:  [place] of Information:  Patient Patient Interpreter Needed:  None Criminal Activity/Legal Involvement Pertinent to Current Situation/Hospitalization:  No - Comment as needed Significant Relationships:  Adult Children, Spouse, Community Support Lives with:  Spouse Do you feel safe going back to the place where you live?  Yes Need for family participation in patient care:  Yes (Comment)  Care giving concerns: Patient lives with wife in [place].    Social Worker assessment / plan:  CSW consulted due to patient needs SNF at discharge. CSW met with patient, wife Uvaldo Rybacki [phone] and son [name] [phone] to discuss discharge needs. Patient states that he was "free as a bird" before coming to have surgery (BKA). Patient used a cane at home and was independent with most activities of daily living. Patient and wife are in agreement for patient to go to SNF at discharge for STR. Patient would like to go to a facility in [place] because it is closer to his home and his children live there. CSW explained the bed search process and that we would look into facilities in [place] but if they did not have availability then we would have to look at [facility]. Patient gave CSW permission to do a bed search in [place] and [place]. CSW explained to patient and wife that we would have to  get authorization from [place] before going to SNF. CSW provided patient with a list of SNF facilities in the area. CSW will continue with bed search and inform family with bed offers.   Employment status:  Retired Nurse, adult PT Recommendations:  [place] / Referral to community resources:  [place]  Patient/Family's Response to care:  Patient expressed understanding and acceptance of SNF at discharge.   Patient/Family's Understanding of and Emotional Response to Diagnosis, Current Treatment, and Prognosis:  Patient, wife and son all express understanding and acceptance of patient's current status as well as SNF placement.   Emotional Assessment Appearance:  Appears younger than stated age Attitude/Demeanor/Rapport:    Affect (typically observed):  Accepting, Calm, Appropriate Orientation:  Oriented to Self, Oriented to Place, Oriented to  Time, Oriented to Situation Alcohol / Substance use:  Not Applicable Psych involvement (Current and /or in the community):  No (Comment)  Discharge Needs  Concerns to be addressed:  Discharge Planning Concerns Readmission within the last 30 days:  No Current discharge risk:  Dependent with Mobility Barriers to Discharge:  Continued Medical Work up   [employer], [place] 12/16/2017, 11:32 AM

## 2017-12-16 NOTE — Clinical Social Work Note (Signed)
CSW gave patient bed offers. Patient's family chose [facility]. CSW attempted to contact [facility] but admissions coordinator has already left for the day. CSW will contact tomorrow regarding bed offer.   [place], [place]

## 2017-12-16 NOTE — Plan of Care (Signed)
Pts pain has been tolerable today. Sat in chair most of the day

## 2017-12-16 NOTE — Progress Notes (Signed)
Physical Therapy Treatment Patient Details Name: Eric [name]. MRN: [id number] DOB: November 11, 1927 Today's Date: 12/16/2017    History of Present Illness Patient is an 84 year old male admitted s/p L BKA following gangrene of the foot.  PMH includes PVD, hyperglycemia, persistent atrial fibrillation, NM disorder, DM, CKD III, cataracts, CAD and atrial flutter.    PT Comments    Pt in bed, ready for session.  Participated in exercises as described below.  Pt transitioned to edge of bed with rail and increased time.  During static sitting balance is good.  Once initiating sliding board transfer, pt with frequent post LOB requiring assist to prevent post fall.  He was able to scoot without physical assist.  He remained up in recliner after session.   Follow Up Recommendations  SNF     Equipment Recommendations       Recommendations for Other Services       Precautions / Restrictions Precautions Precautions: Fall Precaution Comments: enteric Restrictions Weight Bearing Restrictions: Yes LLE Weight Bearing: Non weight bearing    Mobility  Bed Mobility Overal bed mobility: Modified Independent             General bed mobility comments: additional time/effort  Transfers Overall transfer level: Needs assistance Equipment used: None Transfers: Lateral/Scoot Transfers          Lateral/Scoot Transfers: Min assist;Mod assist General transfer comment: for balance, able to scoot without assist.  Ambulation/Gait                 Stairs             Wheelchair Mobility    Modified Rankin (Stroke Patients Only)       Balance Overall balance assessment: Needs assistance Sitting-balance support: Feet supported;Feet unsupported Sitting balance-Leahy Scale: Poor Sitting balance - Comments: good balance static, poor with sliding board transfer with frequent LOB post and assist needed to prevent post fall.       Standing balance comment: deferred                             Cognition Arousal/Alertness: Awake/alert Behavior During Therapy: WFL for tasks assessed/performed Overall Cognitive Status: Within Functional Limits for tasks assessed                                 General Comments: pt joking a lot during session.      Exercises Other Exercises Other Exercises: LLE arom in supine and sitting for quad sets, SLR, Ab/Ad, hip/knee flexion, LAQ  Other Exercises: Pt instructed in importance of keeping L knee straight to minimize risk of contracture    General Comments        Pertinent Vitals/Pain Pain Assessment: 0-10 Pain Score: 1  Pain Location: Area of surgical site L LE Pain Descriptors / Indicators: Sore Pain Intervention(s): Monitored during session;Limited activity within patient's tolerance    Home Living Family/patient expects to be discharged to:: Skilled nursing facility Living Arrangements: Spouse/significant other             Additional Comments: level entry walk in basement with chair lift to main floor.     Prior Function Level of Independence: Independent with assistive device(s)      Comments: Used a cane or walking stick intermittently when out walking. Has a lift chair for use of multiple steps to basement. Indep with ADL,  med mgt. Pt is the primary driver, but spouse does drive   PT Goals (current goals can now be found in the care plan section) Acute Rehab PT Goals Patient Stated Goal: To eventually return home and be as mobile as possible. Progress towards PT goals: Progressing toward goals    Frequency    7X/week      PT Plan Current plan remains appropriate    Co-evaluation              AM-PAC PT "6 Clicks" Daily Activity  Outcome Measure  Difficulty turning over in bed (including adjusting bedclothes, sheets and blankets)?: None Difficulty moving from lying on back to sitting on the side of the bed? : None Difficulty sitting down on and standing up  from a chair with arms (e.g., wheelchair, bedside commode, etc,.)?: Unable Help needed moving to and from a bed to chair (including a wheelchair)?: A Lot Help needed walking in hospital room?: Total Help needed climbing 3-5 steps with a railing? : Total 6 Click Score: 13    End of Session Equipment Utilized During Treatment: Gait belt Activity Tolerance: Patient tolerated treatment well Patient left: in chair;with call bell/phone within reach;with chair alarm set         Time: 0946-1010 PT Time Calculation (min) (ACUTE ONLY): 24 min  Charges:  $Therapeutic Exercise: 8-22 mins $Therapeutic Activity: 8-22 mins                    G Codes:       Chesley Noon, PTA 12/16/17, 10:58 AM

## 2017-12-17 LAB — CBC
HEMATOCRIT: 29.4 % — AB (ref 40.0–52.0)
HEMOGLOBIN: 10 g/dL — AB (ref 13.0–18.0)
MCH: 31.4 pg (ref 26.0–34.0)
MCHC: 34.1 g/dL (ref 32.0–36.0)
MCV: 92.1 fL (ref 80.0–100.0)
Platelets: 236 10*3/uL (ref 150–440)
RBC: 3.19 MIL/uL — ABNORMAL LOW (ref 4.40–5.90)
RDW: 15.6 % — AB (ref 11.5–14.5)
WBC: 7.4 10*3/uL (ref 3.8–10.6)

## 2017-12-17 LAB — BASIC METABOLIC PANEL
ANION GAP: 6 (ref 5–15)
BUN: 33 mg/dL — AB (ref 6–20)
CALCIUM: 8 mg/dL — AB (ref 8.9–10.3)
CO2: 22 mmol/L (ref 22–32)
Chloride: 105 mmol/L (ref 101–111)
Creatinine, Ser: 1.18 mg/dL (ref 0.61–1.24)
GFR calc Af Amer: >60 mL/min (ref >60)
GFR calc non Af Amer: 54 mL/min — ABNORMAL LOW (ref >60)
GLUCOSE: 106 mg/dL — AB (ref 65–99)
Potassium: 4.1 mmol/L (ref 3.5–5.1)
Sodium: 133 mmol/L — ABNORMAL LOW (ref 135–145)

## 2017-12-17 LAB — SURGICAL PATHOLOGY

## 2017-12-17 LAB — GLUCOSE, CAPILLARY: GLUCOSE-CAPILLARY: 81 mg/dL (ref 65–99)

## 2017-12-17 MED ORDER — OXYCODONE HCL 5 MG PO TABS: ORAL_TABLET | ORAL | 0 refills | Status: DC

## 2017-12-17 MED ORDER — SODIUM CHLORIDE 1 G PO TABS
2.0000 g | ORAL_TABLET | Freq: Once | ORAL | Status: AC
  Administered 2017-12-17: 2 g via ORAL
  Filled 2017-12-17: qty 2

## 2017-12-17 MED ORDER — VANCOMYCIN 50 MG/ML ORAL SOLUTION: 125.0000 mg | Freq: Four times a day (QID) | ORAL | 0 refills | Status: DC

## 2017-12-17 MED ORDER — ENSURE ENLIVE PO LIQD
237.0000 mL | Freq: Three times a day (TID) | ORAL | Status: DC
  Administered 2017-12-18 – 2017-12-20 (×5): 237 mL via ORAL

## 2017-12-17 MED ORDER — ONDANSETRON HCL 4 MG/2ML IJ SOLN
4.0000 mg | Freq: Three times a day (TID) | INTRAMUSCULAR | Status: DC
  Administered 2017-12-17 – 2017-12-19 (×6): 4 mg via INTRAVENOUS
  Filled 2017-12-17 (×6): qty 2

## 2017-12-17 NOTE — Discharge Summary (Addendum)
[facility]    Discharge Summary   Patient ID:  Eric W Billig [name] MRN: [id number] DOB/AGE: 01/30/1928 84 y.o.  Admit date: 12/15/2017 Discharge date: 12/21/17 Date of Surgery: 12/15/2017 Surgeon: Surgeon(s): [name], Jason S, MD  Admission Diagnosis: ASO WITH GANGRENE C. Diff  Discharge Diagnoses:  ASO WITH GANGRENE C. Diff  Secondary Diagnoses: Past Medical History:  Diagnosis Date  . Atrial flutter (HCC)    s/p RFCA  . CAD (coronary artery disease)    cath 2003, occluded S-RCA, occluded S-Dx, L-LAD ok, s/p PTCA to LAD  . Cataract   . CKD (chronic kidney disease) stage 3, GFR 30-59 ml/min (HCC)   . Diabetes mellitus    diet controlled  . Hearing aid worn    bilateral  . History of kidney stones   . Long term (current) use of anticoagulants   . Neuromuscular disorder (HCC)   . OSA (obstructive sleep apnea) 12/11   very mild, AHI 7/hr  . Persistent atrial fibrillation (HCC) 09/26/2015  . Pure hyperglyceridemia   . PVD (peripheral vascular disease) (HCC)    angioplasty of his right lower extremity in [place] by Dr.[name] 2013  . Unspecified essential hypertension   . Wears dentures    full upper and lower   Procedure(s): AMPUTATION BELOW KNEE  Discharged Condition: fair  HPI:  The patient is an 84-year-old male with a known history of atrial flutter, coronary artery disease, chronic kidney disease stage III, diabetes mitis diet-controlled, sleep apnea, peripheral vascular disease who is status post a left lower extremity below the knee amputation which was completed on Dec 15, 2017.  The patient tolerated the procedure well and was transferred to the surgical floor without issue.  The patient's night of surgery was unremarkable.  During the patient's brief inpatient stay, the patient began to experience multiple loose watery foul-smelling diarrhea.  The patient was positive for C. difficile.  He began treatment with oral vancomycin on Dec 12, 2017.  This should be continued for a total of 14 days.  Postop day 2 the patient had an accident and unfortunately fell out of bed.  There was no injury to his stump.  The patient received physical therapy.  He was also followed by our medicine service. He has had some persistent diarrhea from his C. Diff colitis and some concern about dehydration over the weekend.  A couple of days of hydration and continued PT and he was ready for discharge on 5/14. Upon discharge, the patient was tolerating a regular diet, his pain was controlled through the use of PO pain medication and urinating / experiencing bowel movements.  Hospital Course:  Eric W Ambs [name] is a 84 y.o. male is S/P Left  Procedure(s): AMPUTATION BELOW KNEE  Extubated: POD # 0  Physical exam:  A&Ox3, NAD CV: RRR Pulmonary: CTA Bilaterally Abdomen: Soft, Nontender, Nondistended, Hyperactive bowel sounds Vascular:             Left lower extremity: OR dressing intact.  Dressing clean and dry.  Thigh is soft  Post-op wounds clean, dry, intact or healing well  Pt. voiding and taking PO diet without difficulty.  Pt pain controlled with PO pain meds.  Labs as below  Complications: None  Consults:  Internal Medicine  Significant Diagnostic Studies: CBC Lab Results  Component Value Date   WBC 7.4 12/17/2017   HGB 10.0 (L) 12/17/2017   HCT 29.4 (L) 12/17/2017   MCV 92.1 12/17/2017   PLT 236   12/17/2017   BMET    Component Value Date/Time   NA 133 (L) 12/17/2017 0340   NA 139 04/28/2012 0658   K 4.1 12/17/2017 0340   K 4.2 04/28/2012 0658   CL 105 12/17/2017 0340   CL 106 04/28/2012 0658   CO2 22 12/17/2017 0340   CO2 25 04/28/2012 0658   GLUCOSE 106 (H) 12/17/2017 0340   GLUCOSE 114 (H) 04/28/2012 0658   BUN 33 (H) 12/17/2017 0340   BUN 32 (H) 04/28/2012 0658   CREATININE 1.18 12/17/2017 0340   CREATININE 1.44 (H) 12/10/2017 1038   CALCIUM 8.0 (L) 12/17/2017 0340   CALCIUM 8.9 04/28/2012 0658   GFRNONAA  54 (L) 12/17/2017 0340   GFRNONAA 43 (L) 12/10/2017 1038   GFRAA >60 12/17/2017 0340   GFRAA 50 (L) 12/10/2017 1038   COAG Lab Results  Component Value Date   INR 1.11 12/15/2017   INR 1.46 12/13/2017   INR 1.27 11/15/2017   PROTIME 19.9 01/10/2009   Disposition:  Discharge to :Rehab  Allergies as of 12/17/2017      Reactions   Isosorbide Mononitrate Other (See Comments)   Unknown, doesn't remember    Ramipril Cough   Quinidine Gluconate Rash      Medication List    STOP taking these medications   amoxicillin-clavulanate 875-125 MG tablet Commonly known as:  AUGMENTIN     TAKE these medications   acetaminophen 500 MG tablet Commonly known as:  TYLENOL Take 1,000 mg by mouth every 8 (eight) hours as needed for mild pain.   amLODipine 10 MG tablet Commonly known as:  NORVASC Take 1 tablet (10 mg total) by mouth daily.   apixaban 2.5 MG Tabs tablet Commonly known as:  ELIQUIS Take 1 tablet (2.5 mg total) by mouth 2 (two) times daily.   B Complex-B12 Tabs Take 1 tablet by mouth daily.   calcium carbonate 500 MG chewable tablet Commonly known as:  TUMS - dosed in mg elemental calcium Chew 1 tablet by mouth daily as needed for indigestion or heartburn.   cholecalciferol 1000 units tablet Commonly known as:  VITAMIN D Take 1,000 Units by mouth 2 (two) times a week. Takes on Mon and Fri   CINNAMON PO Take 1,000 mg by mouth 2 (two) times daily.   DAILY MULTIVITAMIN PO Take 1 tablet by mouth daily.   fenofibrate 160 MG tablet Take 1 tablet (160 mg total) by mouth daily.   fish oil-omega-3 fatty acids 1000 MG capsule Take 1,000 mg by mouth daily.   glucose blood test strip Use as instructed   hydrochlorothiazide 25 MG tablet Commonly known as:  HYDRODIURIL Take 1 tablet (25 mg total) by mouth daily.   lisinopril 40 MG tablet Commonly known as:  PRINIVIL,ZESTRIL Take 1 tablet (40 mg total) by mouth daily.   metoprolol tartrate 50 MG tablet Commonly  known as:  LOPRESSOR Take 1 tablet (50 mg total) by mouth 2 (two) times daily.   nitroGLYCERIN 0.2 mg/hr patch Commonly known as:  NITRODUR - Dosed in mg/24 hr Place 1 patch (0.2 mg total) onto the skin daily.   nutrition supplement (JUVEN) Pack Take 1 packet by mouth 2 (two) times daily between meals.   ONE TOUCH ULTRA SYSTEM KIT w/Device Kit 1 kit by Does not apply route once.   ONETOUCH DELICA LANCETS FINE Misc check sugar once daily   oxyCODONE 5 MG immediate release tablet Commonly known as:  Oxy IR/ROXICODONE One to two tabs every four to   six hours as needed for pain   pentoxifylline 400 MG CR tablet Commonly known as:  TRENTAL Take 1 tablet (400 mg total) by mouth 3 (three) times daily with meals.   rosuvastatin 40 MG tablet Commonly known as:  CRESTOR Take 1 tablet (40 mg total) by mouth daily.   vancomycin 50 mg/mL oral solution Commonly known as:  VANCOCIN Take 2.5 mLs (125 mg total) by mouth every 6 (six) hours for 14 days.      Verbal and written Discharge instructions given to the patient. Wound care per Discharge AVS  Contact information for follow-up providers    [name], Jason S, MD Follow up in 10 day(s).   Specialties:  Vascular Surgery, Radiology, Interventional Cardiology Why:  First post-op visit. First staple removal.  Contact information: [address] [place] [address] [phone]            Contact information for after-discharge care    Destination    [facility] .   Service:  Skilled Nursing Contact information: [address] [place] [address] [phone]                 Signed: KIMBERLY A STEGMAYER, PA-C  12/17/2017, 2:47 PM   

## 2017-12-17 NOTE — Progress Notes (Signed)
[place] at [facility]                                                                                                                                                                                  Patient Demographics   Eric [name], is a 84 y.o. male, DOB - [birth year], DXI:[id number]  Admit date - 12/15/2017   Admitting Physician Algernon [name], MD  Outpatient Primary MD for the patient is [name], MD   LOS - 2  Subjective: Patient continues to have a lot of diarrhea.  He is not eating or drinking much    Review of Systems:   CONSTITUTIONAL: No documented fever. No fatigue, weakness. No weight gain, no weight loss.  EYES: No blurry or double vision.  ENT: No tinnitus. No postnasal drip. No redness of the oropharynx.  RESPIRATORY: No cough, no wheeze, no hemoptysis. No dyspnea.  CARDIOVASCULAR: No chest pain. No orthopnea. No palpitations. No syncope.  GASTROINTESTINAL: No nausea, no vomiting or positive diarrhea. No abdominal pain. No melena or hematochezia.  GENITOURINARY: No dysuria or hematuria.  ENDOCRINE: No polyuria or nocturia. No heat or cold intolerance.  HEMATOLOGY: No anemia. No bruising. No bleeding.  INTEGUMENTARY: No rashes. No lesions.  MUSCULOSKELETAL: No arthritis. No swelling. No gout.  NEUROLOGIC: No numbness, tingling, or ataxia. No seizure-type activity.  PSYCHIATRIC: No anxiety. No insomnia. No ADD.    Vitals:   Vitals:   12/17/17 0630 12/17/17 0821 12/17/17 1008 12/17/17 1229  BP: (!) 126/56 (!) 141/55  125/67  Pulse:  69  64  Resp:    14  Temp:   98.7 F (37.1 C) 98.7 F (37.1 C)  TempSrc:   Axillary Oral  SpO2: 98%   97%  Weight:      Height:        Wt Readings from Last 3 Encounters:  12/15/17 78.8 kg (173 lb 11.6 oz)  12/13/17 75.8 kg (167 lb)  12/10/17 75.8 kg (167 lb)     Intake/Output Summary (Last 24 hours) at 12/17/2017 1502 Last data filed at 12/17/2017 1112 Gross per 24 hour  Intake 1145  ml  Output 2650 ml  Net -1505 ml    Physical Exam:   GENERAL: Pleasant-appearing in no apparent distress.  HEAD, EYES, EARS, NOSE AND THROAT: Atraumatic, normocephalic. Extraocular muscles are intact. Pupils equal and reactive to light. Sclerae anicteric. No conjunctival injection. No oro-pharyngeal erythema.  NECK: Supple. There is no jugular venous distention. No bruits, no lymphadenopathy, no thyromegaly.  HEART: Regular rate and rhythm,. No murmurs, no rubs, no clicks.  LUNGS: Clear to auscultation bilaterally. No rales or rhonchi. No wheezes.  ABDOMEN:  Soft, flat, nontender, nondistended. Has good bowel sounds. No hepatosplenomegaly appreciated.  EXTREMITIES: No evidence of any cyanosis, clubbing, or peripheral edema.  +2 pedal and radial pulses bilaterally.  NEUROLOGIC: The patient is alert, awake, and oriented x3 with no focal motor or sensory deficits appreciated bilaterally.  SKIN: Moist and warm with no rashes appreciated.  Psych: Not anxious, depressed LN: No inguinal LN enlargement    Antibiotics   Anti-infectives (From admission, onward)   Start     Dose/Rate Route Frequency Ordered Stop   12/17/17 0000  vancomycin (VANCOCIN) 50 mg/mL oral solution     125 mg Oral Every 6 hours 12/17/17 1445 12/31/17 2359   12/16/17 1200  vancomycin (VANCOCIN) 50 mg/mL oral solution 125 mg     125 mg Oral Every 6 hours 12/16/17 1127     12/15/17 1600  ceFAZolin (ANCEF) IVPB 2g/100 mL premix     2 g 200 mL/hr over 30 Minutes Intravenous Every 8 hours 12/15/17 1044 12/16/17 0217   12/15/17 0603  ceFAZolin (ANCEF) 2-4 GM/100ML-% IVPB    Note to Pharmacy:  [place]: cabinet override      12/15/17 0603 12/15/17 1814   12/15/17 0600  ceFAZolin (ANCEF) IVPB 2g/100 mL premix     2 g 200 mL/hr over 30 Minutes Intravenous On call to O.R. 12/14/17 2249 12/15/17 0745      Medications   Scheduled Meds: . amLODipine  10 mg Oral Daily  . apixaban  2.5 mg Oral BID  . B-complex  with vitamin C  1 tablet Oral Daily  . cholecalciferol  1,000 Units Oral Once per day on Mon Fri  . docusate sodium  100 mg Oral Daily  . feeding supplement (ENSURE ENLIVE)  237 mL Oral TID BM  . fenofibrate  160 mg Oral Daily  . hydrochlorothiazide  25 mg Oral Daily  . lisinopril  40 mg Oral Daily  . metoprolol tartrate  50 mg Oral BID  . multivitamin with minerals  1 tablet Oral Daily  . nutrition supplement (JUVEN)  1 packet Oral BID BM  . omega-3 acid ethyl esters  1,000 mg Oral Daily  . ondansetron (ZOFRAN) IV  4 mg Intravenous Q8H  . pentoxifylline  400 mg Oral TID WC  . rosuvastatin  40 mg Oral Daily  . vancomycin  125 mg Oral Q6H   Continuous Infusions: . sodium chloride 75 mL/hr at 12/17/17 0649  . famotidine (PEPCID) IV Stopped (12/17/17 0945)  . magnesium sulfate 1 - 4 g bolus IVPB     PRN Meds:.acetaminophen **OR** acetaminophen, alum & mag hydroxide-simeth, calcium carbonate, guaiFENesin-dextromethorphan, hydrALAZINE, labetalol, magnesium sulfate 1 - 4 g bolus IVPB, metoprolol tartrate, morphine injection, ondansetron, oxyCODONE-acetaminophen, phenol, polyethylene glycol, potassium chloride, sorbitol   Data Review:   Micro Results No results found for this or any previous visit (from the past 240 hour(s)).  Radiology Reports No results found.   CBC Recent Labs  Lab 12/15/17 1052 12/16/17 0420 12/17/17 0340  WBC 7.4 9.3 7.4  HGB 10.9* 10.3* 10.0*  HCT 32.5* 30.2* 29.4*  PLT 232 230 236  MCV 92.4 93.4 92.1  MCH 31.0 31.8 31.4  MCHC 33.5 34.0 34.1  RDW 15.4* 15.7* 15.6*    Chemistries  Recent Labs  Lab 12/15/17 1052 12/16/17 0420 12/17/17 0340  NA  --  133* 133*  K  --  4.5 4.1  CL  --  106 105  CO2  --  22 22  GLUCOSE  --  131* 106*  BUN  --  26* 33*  CREATININE 1.46* 1.25* 1.18  CALCIUM  --  7.9* 8.0*   ------------------------------------------------------------------------------------------------------------------ estimated creatinine  clearance is 42.7 mL/min (by C-G formula based on SCr of 1.18 mg/dL). ------------------------------------------------------------------------------------------------------------------ No results for input(s): HGBA1C in the last 72 hours. ------------------------------------------------------------------------------------------------------------------ No results for input(s): CHOL, HDL, LDLCALC, TRIG, CHOLHDL, LDLDIRECT in the last 72 hours. ------------------------------------------------------------------------------------------------------------------ No results for input(s): TSH, T4TOTAL, T3FREE, THYROIDAB in the last 72 hours.  Invalid input(s): FREET3 ------------------------------------------------------------------------------------------------------------------ No results for input(s): VITAMINB12, FOLATE, FERRITIN, TIBC, IRON, RETICCTPCT in the last 72 hours.  Coagulation profile Recent Labs  Lab 12/13/17 0839 12/15/17 0630  INR 1.46 1.11    No results for input(s): DDIMER in the last 72 hours.  Cardiac Enzymes No results for input(s): CKMB, TROPONINI, MYOGLOBIN in the last 168 hours.  Invalid input(s): CK ------------------------------------------------------------------------------------------------------------------ Invalid input(s): [place]   Andriy Sherk  is a 84 y.o. male with a known history of atrial flutter, coronary artery disease, chronic kidney disease stage III, diabetes mellitus diet-controlled, sleep apnea, peripheral vascular disease currently under vascular surgery service.  -C. difficile toxigenic colitis Continue oral vancomycin 125 mg every 6 hourly for 14 days Enteric precautions Patient still having significant diarrhea I would continue IV fluids for now  -Hyponatremia IV fluid hydration Monitor electrolytes   - ARF due to dehydration -improved with IV fluids  -Dry gangrene left lower extremity Status post left  below-knee amputation Vascular surgery follow-up and wound care management Daily wound dressings   -Diet controlled diabetes mellitus  consistent carb diet contiue ssi  -Atrial flutter Continue metoprolol for rate control, continue eliquis         Code Status Orders  (From admission, onward)        Start     Ordered   12/15/17 1045  Full code  Continuous     12/15/17 1044    Code Status History    Date Active Date Inactive Code Status Order ID Comments User Context   11/10/2017 1737 11/16/2017 1744 Full Code [id number]  [name], MD Inpatient   06/27/2015 1123 06/27/2015 1813 Full Code [id number]  Algernon [name], MD Inpatient             DVT Prophylaxis eliquis  Lab Results  Component Value Date   PLT 236 12/17/2017     Time Spent in minutes   5min Greater than 50% of time spent in care coordination and counseling patient regarding the condition and plan of care.   Dustin Flock M.D on 12/17/2017 at 3:02 PM  Between 7am to 6pm - Pager - [phone]  After 6pm go to [email or web address] - Proofreader  [employer]   Office  [phone]

## 2017-12-17 NOTE — Progress Notes (Signed)
PT Cancellation Note  Patient Details Name: Eric [name]. MRN: [id number] DOB: 03-13-1928   Cancelled Treatment:    Reason Eval/Treat Not Completed: (Chart reviewed for planned treatment session.  Per primary RN, vascular group requested hold on therapy this date (note not yet entered) due to patient not feeling well, unable to tolerate this date.  Will continue efforts next date as medically appropriate.)   Shahidah Nesbitt H. Owens Shark, PT, DPT, NCS 12/17/17, 11:11 AM [phone]

## 2017-12-17 NOTE — Progress Notes (Signed)
Patient called out to desk, stated that he had to "get up"; found patient fidgeting with covers and depends and seemingly in a hurry; encouraged patient to slow down, that we were here to help him and not try to try to use the potty or try to get OOB without assistance; instructed patient that he was not getting OOB tonight, that he would be using the bedpan; tele-sitter suspended; assisted patient with bedpan for BM; peri care given; alarm re-engaged; telesitter reinstated; warm blanket given; lights out; all green lights at end of bed. Barbaraann Faster, RN 1:14 AM 12/17/2017

## 2017-12-17 NOTE — Clinical Social Work Note (Signed)
CSW spoke with Abigail Butts at [facility] to accept bed offer for patient. CSW explained that patient would not be ready for discharge today due to fall last night and tele sitter. CSW also informed Abigail Butts that patient was positive for C-diff and would need a private room. Abigail Butts reported that patient was already slated for a private room and that would not be a problem. Also Abigail Butts stated that weekend discharge would be fine if needed. Will continue to follow for discharge needs.   Annamaria Boots MSW, [place] [phone]

## 2017-12-17 NOTE — Progress Notes (Signed)
[name] called back; notified of uninjured fall; VSS; left stump elevated on pillow; bed alarm engaged; acknowledged; appologized for not calling back earlier, stated that he had his phone with him, but was otherwise engaged and was not told that his phone had rung.  Stated that the numbers were correct. Thanked him for his callback. Will continue to monitor patient. Barbaraann Faster, RN

## 2017-12-17 NOTE — Progress Notes (Signed)
[facility]  Daily Progress Note   Subjective: 2 Days Post-Op: Left below-the-knee amputation  Patient status post fall last night.  Patient has C. difficile and was experiencing a bowel movement.  The patient did not want to soil himself in bed tried to get up to use the commode and fell.  A sitter was placed in his room.  The patient is complaining of being "very tired" today.  Objective: Vitals:   12/17/17 0533 12/17/17 0630 12/17/17 0821 12/17/17 1008  BP: 117/69 (!) 126/56 (!) 141/55   Pulse:   69   Resp:      Temp:    98.7 F (37.1 C)  TempSrc:    Axillary  SpO2:  98%    Weight:      Height:        Intake/Output Summary (Last 24 hours) at 12/17/2017 1022 Last data filed at 12/17/2017 7262 Gross per 24 hour  Intake 1856 ml  Output 2200 ml  Net -344 ml   Physical Exam: A&Ox3, NAD CV: RRR Pulmonary: CTA Bilaterally Abdomen: Soft, Nontender, Nondistended Vascular:  Left lower extremity: OR dressing intact.  Dressing clean and dry.  Thigh is soft.   Laboratory: CBC    Component Value Date/Time   WBC 7.4 12/17/2017 0340   HGB 10.0 (L) 12/17/2017 0340   HCT 29.4 (L) 12/17/2017 0340   PLT 236 12/17/2017 0340   BMET    Component Value Date/Time   NA 133 (L) 12/17/2017 0340   NA 139 04/28/2012 0658   K 4.1 12/17/2017 0340   K 4.2 04/28/2012 0658   CL 105 12/17/2017 0340   CL 106 04/28/2012 0658   CO2 22 12/17/2017 0340   CO2 25 04/28/2012 0658   GLUCOSE 106 (H) 12/17/2017 0340   GLUCOSE 114 (H) 04/28/2012 0658   BUN 33 (H) 12/17/2017 0340   BUN 32 (H) 04/28/2012 0658   CREATININE 1.18 12/17/2017 0340   CREATININE 1.44 (H) 12/10/2017 1038   CALCIUM 8.0 (L) 12/17/2017 0340   CALCIUM 8.9 04/28/2012 0658   GFRNONAA 54 (L) 12/17/2017 0340   GFRNONAA 43 (L) 12/10/2017 1038   GFRAA >60 12/17/2017 0340   GFRAA 50 (L) 12/10/2017 1038   Assessment/Planning: The patient is a 84 year old male status post a left below the knee amputation for  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  The patient is also very lethargic.  Will hold off on discharge today.  If the patient's condition improves possible discharge to rehab tomorrow. 2) C.Diff: On vancomycin.  Patient's rehab is going to accept patient with C. difficile.  He will have a private room. 3) AM Labs 4) Remove OR dressing before discharge to rehab.  Discussed with Dr. Ellis Parents Daily Crate PA-C 12/17/2017 10:22 AM

## 2017-12-17 NOTE — Clinical Social Work Note (Signed)
CSW spoke with Tammy at [facility] to begin SNF authorization.   Annamaria Boots MSW, [place]  [phone]

## 2017-12-17 NOTE — Discharge Instructions (Signed)
Please remove the patient's operating room dressing on Monday Dec 20, 2017. The patient's incision has been closed with staples.  The patient will follow-up in our office for his first postoperative visit in approximately 10 days to have his first set of staples removed. Please keep the stump incision site clean and dry.  Please keep the stump covered with a Kerlix and an Ace wrap.  Please change the dressing on a daily basis or sooner as needed if drainage is noted. Please keep the patient's extremity elevated. Please encourage the patient to straighten his left knee joint to avoid contracture.

## 2017-12-17 NOTE — Clinical Social Work Note (Signed)
CSW received call back from [place] at [facility] with authorization for patient to transfer to [facility] in [place]. Auth provided: [id number]. Vascular to prepare discharge info for today as they will have the hospitalist round tomorrow to officially clear and place the discharge order. Shela Leff MSW,LCSW [phone]

## 2017-12-17 NOTE — Care Management Important Message (Signed)
Important Message  Patient Details  Name: Eric [name]. MRN: [id number] Date of Birth: 1928-03-23   Medicare Important Message Given:  Yes    Marshell Garfinkel, RN 12/17/2017, 1:41 PM

## 2017-12-17 NOTE — Progress Notes (Signed)
OT Cancellation Note  Patient Details Name: Eric [name]. MRN: [id number] DOB: March 11, 1928   Cancelled Treatment:    Reason Eval/Treat Not Completed: Other (comment). Spoke with PT. Per primary RN, vascular group requested hold on therapy this date (note not yet entered) due to patient not feeling well, unable to tolerate this date.  Will continue efforts next date as medically appropriate.  Jeni Salles, MPH, MS, OTR/L ascom [phone] 12/17/17, 1:12 PM

## 2017-12-18 LAB — CBC
HCT: 29.3 % — ABNORMAL LOW (ref 40.0–52.0)
Hemoglobin: 10 g/dL — ABNORMAL LOW (ref 13.0–18.0)
MCH: 31.5 pg (ref 26.0–34.0)
MCHC: 34.2 g/dL (ref 32.0–36.0)
MCV: 92.1 fL (ref 80.0–100.0)
Platelets: 229 10*3/uL (ref 150–440)
RBC: 3.18 MIL/uL — ABNORMAL LOW (ref 4.40–5.90)
RDW: 15.2 % — AB (ref 11.5–14.5)
WBC: 8.8 10*3/uL (ref 3.8–10.6)

## 2017-12-18 LAB — BASIC METABOLIC PANEL
Anion gap: 4 — ABNORMAL LOW (ref 5–15)
BUN: 22 mg/dL — AB (ref 6–20)
CALCIUM: 8.2 mg/dL — AB (ref 8.9–10.3)
CO2: 25 mmol/L (ref 22–32)
Chloride: 103 mmol/L (ref 101–111)
Creatinine, Ser: 0.99 mg/dL (ref 0.61–1.24)
GFR calc Af Amer: >60 mL/min (ref >60)
GFR calc non Af Amer: >60 mL/min (ref >60)
GLUCOSE: 118 mg/dL — AB (ref 65–99)
Potassium: 3.7 mmol/L (ref 3.5–5.1)
Sodium: 132 mmol/L — ABNORMAL LOW (ref 135–145)

## 2017-12-18 LAB — GLUCOSE, CAPILLARY: Glucose-Capillary: 100 mg/dL — ABNORMAL HIGH (ref 65–99)

## 2017-12-18 LAB — MAGNESIUM: Magnesium: 1.3 mg/dL — ABNORMAL LOW (ref 1.7–2.4)

## 2017-12-18 MED ORDER — MAGNESIUM SULFATE 4 GM/100ML IV SOLN
4.0000 g | Freq: Once | INTRAVENOUS | Status: AC
Start: 2017-12-18 — End: 2017-12-18
  Administered 2017-12-18: 4 g via INTRAVENOUS
  Filled 2017-12-18: qty 100

## 2017-12-18 MED ORDER — POTASSIUM CHLORIDE CRYS ER 20 MEQ PO TBCR
40.0000 meq | EXTENDED_RELEASE_TABLET | Freq: Once | ORAL | Status: AC
  Administered 2017-12-18: 40 meq via ORAL
  Filled 2017-12-18: qty 2

## 2017-12-18 NOTE — Progress Notes (Signed)
Physical Therapy Treatment Patient Details Name: Eric [name]. MRN: [id number] DOB: 1927-12-23 Today's Date: 12/18/2017    History of Present Illness Patient is an 84 year old male admitted s/p L BKA following gangrene of the foot.  PMH includes PVD, hyperglycemia, persistent atrial fibrillation, NM disorder, DM, CKD III, cataracts, CAD and atrial flutter.    PT Comments    Chart reviewed.  Aware of fall, pt reported no increased soreness during session.  Participated in exercises as described below.  To edge of bed with rail and increased time but no physical assist.  Remained sitting with min guard.  Lateral scoot transfer to drop arm recliner.  Sliding board not used today.  Pt with overall improved transfer today.  He was given increased time to figure out movements.  Remained up and comfortable in chair.  Reviewed use of call bell.   Follow Up Recommendations  SNF     Equipment Recommendations       Recommendations for Other Services       Precautions / Restrictions Precautions Precautions: Fall Precaution Comments: enteric Restrictions Weight Bearing Restrictions: Yes LLE Weight Bearing: Non weight bearing    Mobility  Bed Mobility Overal bed mobility: Needs Assistance Bed Mobility: Supine to Sit           General bed mobility comments: additional time/effort and supervision for  Transfers Overall transfer level: Needs assistance Equipment used: None Transfers: Lateral/Scoot Transfers          Lateral/Scoot Transfers: Min assist;Min guard General transfer comment: for balance, able to scoot without assist.  Ambulation/Gait                 Stairs             Wheelchair Mobility    Modified Rankin (Stroke Patients Only)       Balance Overall balance assessment: Needs assistance Sitting-balance support: Feet supported;Feet unsupported Sitting balance-Leahy Scale: Poor         Standing balance comment: deferred                             Cognition Arousal/Alertness: Awake/alert Behavior During Therapy: WFL for tasks assessed/performed Overall Cognitive Status: Within Functional Limits for tasks assessed                                        Exercises Other Exercises Other Exercises: LLE arom in supine and sitting for quad sets, SLR, Ab/Ad, hip/knee flexion, LAQ     General Comments        Pertinent Vitals/Pain Pain Assessment: 0-10 Pain Score: 1  Pain Location: Area of surgical site L LE Pain Descriptors / Indicators: Sore Pain Intervention(s): Limited activity within patient's tolerance    Home Living                      Prior Function            PT Goals (current goals can now be found in the care plan section) Progress towards PT goals: Progressing toward goals    Frequency    7X/week      PT Plan Current plan remains appropriate    Co-evaluation              AM-PAC PT "6 Clicks" Daily Activity  Outcome Measure  Difficulty turning  over in bed (including adjusting bedclothes, sheets and blankets)?: None Difficulty moving from lying on back to sitting on the side of the bed? : A Little Difficulty sitting down on and standing up from a chair with arms (e.g., wheelchair, bedside commode, etc,.)?: Unable Help needed moving to and from a bed to chair (including a wheelchair)?: A Lot Help needed walking in hospital room?: Total Help needed climbing 3-5 steps with a railing? : Total 6 Click Score: 12    End of Session Equipment Utilized During Treatment: Gait belt Activity Tolerance: Patient tolerated treatment well Patient left: in chair;with call bell/phone within reach;with chair alarm set         Time: 7628-3151 PT Time Calculation (min) (ACUTE ONLY): 23 min  Charges:  $Therapeutic Exercise: 8-22 mins $Therapeutic Activity: 8-22 mins                    G Codes:       Chesley Noon, PTA 12/18/17, 12:03 PM

## 2017-12-18 NOTE — Plan of Care (Signed)
Pt worked with PT today and did well. Sat in the chair for awhile

## 2017-12-18 NOTE — Progress Notes (Signed)
Occupational Therapy Treatment Patient Details Name: Eric [name]. MRN: [id number] DOB: August 01, 1928 Today's Date: 12/18/2017    History of present illness Patient is an 84 year old male who was admitted s/p L BKA following gangrene of the foot.  PMH includes PVD, hyperglycemia, persistent atrial fibrillation, NM disorder, DM, CKD III, cataracts, CAD and atrial flutter.   OT comments  Pt. Continues to be on contact precautions.Pt. education was provided about A/E use for LE ADLs. Pt. worked on challenging static, and dynamic unsupported sitting balance. Pt. Could benefit from OT services for ADL training, A/E training, UE there. Ex, and pt. Education about home modification, and DME. Pt. Continues benefit from follow-up OT services at SNF level of care.   Follow Up Recommendations  SNF    Equipment Recommendations  3 in 1 bedside commode    Recommendations for Other Services      Precautions / Restrictions Precautions Precautions: Fall Restrictions Weight Bearing Restrictions: Yes LLE Weight Bearing: Non weight bearing       Mobility Bed Mobility Overal bed mobility: Needs Assistance Bed Mobility: Supine to Sit           General bed mobility comments: Supervison  Transfers                      Balance Overall balance assessment: Needs assistance   Sitting balance-Leahy Scale: Fair                                     ADL either performed or assessed with clinical judgement   ADL Overall ADL's : Needs assistance/impaired Eating/Feeding: Independent;Sitting   Grooming: Sitting;Independent   Upper Body Bathing: Sitting;Supervision/ safety   Lower Body Bathing: Sitting/lateral leans;Min guard;Set up   Upper Body Dressing : Sitting;Set up;Supervision/safety   Lower Body Dressing: Minimal assistance;Sitting/lateral leans                 General ADL Comments: Pt. edcuation was porvided about A/E for LE ADLs.      Vision  Patient Visual Report: No change from baseline     Perception     Praxis      Cognition Arousal/Alertness: Awake/alert Behavior During Therapy: WFL for tasks assessed/performed Overall Cognitive Status: Within Functional Limits for tasks assessed                                          Exercises     Shoulder Instructions       General Comments      Pertinent Vitals/ Pain       Pain Assessment: 0-10  Home Living                                          Prior Functioning/Environment              Frequency  Min 1X/week        Progress Toward Goals  OT Goals(current goals can now be found in the care plan section)     Acute Rehab OT Goals Patient Stated Goal: To regain independence Time For Goal Achievement: 12/30/17 Potential to Achieve Goals: Good  Plan      Co-evaluation  AM-PAC PT "6 Clicks" Daily Activity     Outcome Measure   Help from another person eating meals?: None Help from another person taking care of personal grooming?: None Help from another person toileting, which includes using toliet, bedpan, or urinal?: A Lot Help from another person bathing (including washing, rinsing, drying)?: A Lot Help from another person to put on and taking off regular upper body clothing?: A Little Help from another person to put on and taking off regular lower body clothing?: A Little 6 Click Score: 18    End of Session        Activity Tolerance Patient tolerated treatment well   Patient Left in bed;with call bell/phone within reach;with bed alarm set;Other (comment)   Nurse Communication          Time: 9518-8416 OT Time Calculation (min): 24 min  Charges: OT General Charges $OT Visit: 1 Visit OT Treatments $Self Care/Home Management : 23-37 mins  Harrel Carina, MS, OTR/L    Harrel Carina, MS, OTR/L 12/18/2017, 5:46 PM

## 2017-12-18 NOTE — Progress Notes (Signed)
Patient ID: Eric Hilbun., male   DOB: Apr 07, 1928, 84 y.o.   MRN: [id number]  [facility] PROGRESS NOTE  Eric [name]. [id number] DOB: 16-Jun-1928 DOA: 12/15/2017 PCP: [name], MD  HPI/Subjective: Patient thinks his diarrhea is settling down.  Some abdominal discomfort.  Some nausea.  Tolerating diet.  Objective: Vitals:   12/18/17 0829 12/18/17 1120  BP: (!) 159/66 (!) 149/72  Pulse: 62 75  Resp:  15  Temp:  98.7 F (37.1 C)  SpO2:  100%    Filed Weights   12/15/17 1100  Weight: 78.8 kg (173 lb 11.6 oz)    ROS: Review of Systems  Constitutional: Negative for chills and fever.  Eyes: Negative for blurred vision.  Respiratory: Negative for cough and shortness of breath.   Cardiovascular: Negative for chest pain.  Gastrointestinal: Positive for abdominal pain and diarrhea. Negative for constipation, nausea and vomiting.  Genitourinary: Negative for dysuria.  Musculoskeletal: Negative for joint pain.  Neurological: Negative for dizziness and headaches.   Exam: Physical Exam  HENT:  Nose: No mucosal edema.  Mouth/Throat: No oropharyngeal exudate or posterior oropharyngeal edema.  Eyes: Pupils are equal, round, and reactive to light. Conjunctivae, EOM and lids are normal.  Neck: No JVD present. Carotid bruit is not present. No edema present. No thyroid mass and no thyromegaly present.  Cardiovascular: S1 normal and S2 normal. Exam reveals no gallop.  No murmur heard. Pulses:      Dorsalis pedis pulses are 2+ on the right side.  Respiratory: No respiratory distress. He has no wheezes. He has no rhonchi. He has no rales.  GI: Soft. Bowel sounds are normal. There is no tenderness.  Musculoskeletal:       Left knee: He exhibits swelling.       Right ankle: He exhibits no swelling.  Lymphadenopathy:    He has no cervical adenopathy.  Neurological: He is alert. No cranial nerve deficit.  Skin: Skin is warm. No rash noted. Nails show no clubbing.   Psychiatric: He has a normal mood and affect.      Data Reviewed: Basic Metabolic Panel: Recent Labs  Lab 12/15/17 1052 12/16/17 0420 12/17/17 0340 12/18/17 0630  NA  --  133* 133* 132*  K  --  4.5 4.1 3.7  CL  --  106 105 103  CO2  --  22 22 25   GLUCOSE  --  131* 106* 118*  BUN  --  26* 33* 22*  CREATININE 1.46* 1.25* 1.18 0.99  CALCIUM  --  7.9* 8.0* 8.2*  MG  --   --   --  1.3*   CBC: Recent Labs  Lab 12/15/17 1052 12/16/17 0420 12/17/17 0340 12/18/17 0630  WBC 7.4 9.3 7.4 8.8  HGB 10.9* 10.3* 10.0* 10.0*  HCT 32.5* 30.2* 29.4* 29.3*  MCV 92.4 93.4 92.1 92.1  PLT 232 230 236 229   CBG: Recent Labs  Lab 12/15/17 0611 12/15/17 0909 12/17/17 0640 12/18/17 0749  GLUCAP 76 102* 81 100*     Scheduled Meds: . amLODipine  10 mg Oral Daily  . apixaban  2.5 mg Oral BID  . B-complex with vitamin C  1 tablet Oral Daily  . cholecalciferol  1,000 Units Oral Once per day on Mon Fri  . docusate sodium  100 mg Oral Daily  . feeding supplement (ENSURE ENLIVE)  237 mL Oral TID BM  . fenofibrate  160 mg Oral Daily  . lisinopril  40 mg Oral Daily  .  metoprolol tartrate  50 mg Oral BID  . multivitamin with minerals  1 tablet Oral Daily  . nutrition supplement (JUVEN)  1 packet Oral BID BM  . omega-3 acid ethyl esters  1,000 mg Oral Daily  . ondansetron (ZOFRAN) IV  4 mg Intravenous Q8H  . pentoxifylline  400 mg Oral TID WC  . potassium chloride  40 mEq Oral Once  . rosuvastatin  40 mg Oral Daily  . vancomycin  125 mg Oral Q6H   Continuous Infusions: . sodium chloride 75 mL/hr at 12/18/17 0824  . famotidine (PEPCID) IV Stopped (12/18/17 0957)  . magnesium sulfate 1 - 4 g bolus IVPB      Assessment/Plan:  1. C. difficile colitis.  Continue oral vancomycin.  Diarrhea is starting to slow down than previous.  Hopefully will be able to go to rehab either tomorrow or Monday depending on how much diarrhea as today. 2. Hypo-Magness anemia and hypokalemia.  Replace  magnesium IV and potassium orally 3. Gangrene of the left lower extremity status post left below-knee amputation 4. Hyponatremia.  Discontinue hydrochlorothiazide.  This should also help the magnesium and potassium too. 5. Acute kidney injury improved with IV fluids 6. Atrial flutter on Eliquis and metoprolol 7. Hypertension on Norvasc and metoprolol 8. Hyperlipidemia unspecified on Crestor and fenofibrate 9. Type 2 diabetes mellitus.  All sugars are good with diet control.  Last hemoglobin A1c 6.0.  Code Status:     Code Status Orders  (From admission, onward)        Start     Ordered   12/15/17 1045  Full code  Continuous     12/15/17 1044    Code Status History    Date Active Date Inactive Code Status Order ID Comments User Context   11/10/2017 1737 11/16/2017 1744 Full Code [id number]  [name], MD Inpatient   06/27/2015 1123 06/27/2015 1813 Full Code [id number]  Algernon [name], MD Inpatient     Family Communication: Family at the bedside Disposition Plan: If diarrhea subsides may be able to go out to rehab tomorrow  Antibiotics:  Oral vancomycin  Time spent: 28 minutes  [place]

## 2017-12-19 LAB — BASIC METABOLIC PANEL
ANION GAP: 3 — AB (ref 5–15)
BUN: 21 mg/dL — ABNORMAL HIGH (ref 6–20)
CHLORIDE: 106 mmol/L (ref 101–111)
CO2: 26 mmol/L (ref 22–32)
Calcium: 8.3 mg/dL — ABNORMAL LOW (ref 8.9–10.3)
Creatinine, Ser: 1.03 mg/dL (ref 0.61–1.24)
GFR calc Af Amer: >60 mL/min (ref >60)
GLUCOSE: 109 mg/dL — AB (ref 65–99)
POTASSIUM: 3.9 mmol/L (ref 3.5–5.1)
Sodium: 135 mmol/L (ref 135–145)

## 2017-12-19 LAB — MAGNESIUM: Magnesium: 2 mg/dL (ref 1.7–2.4)

## 2017-12-19 MED ORDER — METOPROLOL TARTRATE 25 MG PO TABS
12.5000 mg | ORAL_TABLET | Freq: Two times a day (BID) | ORAL | Status: DC
  Administered 2017-12-19 – 2017-12-20 (×3): 12.5 mg via ORAL
  Filled 2017-12-19 (×4): qty 1

## 2017-12-19 MED ORDER — LISINOPRIL 20 MG PO TABS
20.0000 mg | ORAL_TABLET | Freq: Every day | ORAL | Status: DC
  Administered 2017-12-19 – 2017-12-20 (×2): 20 mg via ORAL
  Filled 2017-12-19: qty 1

## 2017-12-19 MED ORDER — AMLODIPINE BESYLATE 5 MG PO TABS
2.5000 mg | ORAL_TABLET | Freq: Every day | ORAL | Status: DC
  Administered 2017-12-19 – 2017-12-20 (×2): 2.5 mg via ORAL
  Filled 2017-12-19 (×2): qty 1

## 2017-12-19 NOTE — Progress Notes (Signed)
Physical Therapy Treatment Patient Details Name: Eric [name]. MRN: [id number] DOB: April 06, 1928 Today's Date: 12/19/2017    History of Present Illness Patient is an 84 year old male who was admitted s/p L BKA following gangrene of the foot.  PMH includes PVD, hyperglycemia, persistent atrial fibrillation, NM disorder, DM, CKD III, cataracts, CAD and atrial flutter.    PT Comments    Feeling better.  Participated in exercises as described below.  Pt with overall improved sitting balance and transfer today.  Was able to transition to chair with set up and min guard for balance only during lateral scoot transfer.  Pt was given increased time and overall did well with transfer.     Follow Up Recommendations  SNF     Equipment Recommendations       Recommendations for Other Services       Precautions / Restrictions Precautions Precautions: Fall Precaution Comments: enteric Restrictions Weight Bearing Restrictions: Yes LLE Weight Bearing: Non weight bearing    Mobility  Bed Mobility Overal bed mobility: Needs Assistance Bed Mobility: Supine to Sit           General bed mobility comments: Supervison  Transfers Overall transfer level: Needs assistance Equipment used: None Transfers: Lateral/Scoot Transfers          Lateral/Scoot Transfers: Min guard General transfer comment: Pt able to scoot today with no physcial assist but min gaurd for balance.    Ambulation/Gait                 Stairs             Wheelchair Mobility    Modified Rankin (Stroke Patients Only)       Balance Overall balance assessment: Needs assistance Sitting-balance support: Feet supported;Feet unsupported Sitting balance-Leahy Scale: Fair Sitting balance - Comments: Overall improved static and dynamic balance today.       Standing balance comment: deferred                            Cognition Arousal/Alertness: Awake/alert Behavior During Therapy:  WFL for tasks assessed/performed Overall Cognitive Status: Within Functional Limits for tasks assessed                                        Exercises Other Exercises Other Exercises: LLE arom in supine and sitting for quad sets, SLR, Ab/Ad, hip/knee flexion, LAQ  Other Exercises: Pt instructed in importance of keeping L knee straight to minimize risk of contracture    General Comments        Pertinent Vitals/Pain Pain Assessment: 0-10 Pain Score: 1  Pain Location: Area of surgical site L LE Pain Descriptors / Indicators: Sore Pain Intervention(s): Limited activity within patient's tolerance    Home Living                      Prior Function            PT Goals (current goals can now be found in the care plan section) Progress towards PT goals: Progressing toward goals    Frequency    7X/week      PT Plan Current plan remains appropriate    Co-evaluation              AM-PAC PT "6 Clicks" Daily Activity  Outcome Measure  Difficulty turning  over in bed (including adjusting bedclothes, sheets and blankets)?: None Difficulty moving from lying on back to sitting on the side of the bed? : A Little Difficulty sitting down on and standing up from a chair with arms (e.g., wheelchair, bedside commode, etc,.)?: Unable Help needed moving to and from a bed to chair (including a wheelchair)?: Total Help needed walking in hospital room?: Total Help needed climbing 3-5 steps with a railing? : Total 6 Click Score: 11    End of Session Equipment Utilized During Treatment: Gait belt Activity Tolerance: Patient tolerated treatment well Patient left: in chair;with call bell/phone within reach;with chair alarm set         Time: 3545-6256 PT Time Calculation (min) (ACUTE ONLY): 16 min  Charges:  $Therapeutic Exercise: 8-22 mins                    G Codes:       Chesley Noon, PTA 12/19/17, 10:25 AM

## 2017-12-19 NOTE — Progress Notes (Signed)
Patient ID: Eric [name]., male   DOB: 1927-10-19, 84 y.o.   MRN: [id number]   [facility] PROGRESS NOTE  Eric [name] . [name] DOB: 12-18-1927 DOA: 12/15/2017 PCP: Eric Frizzle, MD  HPI/Subjective: Patient seen this morning.  He had quite a few bowel movements yesterday evening.  Feels okay.  Some abdominal discomfort.  Objective: Vitals:   12/19/17 1023 12/19/17 1437  BP: (!) 137/57 (!) 136/50  Pulse:  64  Resp:  16  Temp:  98.1 F (36.7 C)  SpO2:  100%    Filed Weights   12/15/17 1100  Weight: 78.8 kg (173 lb 11.6 oz)    ROS: Review of Systems  Constitutional: Negative for chills and fever.  Eyes: Negative for blurred vision.  Respiratory: Negative for cough and shortness of breath.   Cardiovascular: Negative for chest pain.  Gastrointestinal: Positive for abdominal pain and diarrhea. Negative for constipation, nausea and vomiting.  Genitourinary: Negative for dysuria.  Musculoskeletal: Negative for joint pain.  Neurological: Negative for dizziness and headaches.   Exam: Physical Exam  HENT:  Nose: No mucosal edema.  Mouth/Throat: No oropharyngeal exudate or posterior oropharyngeal edema.  Eyes: Pupils are equal, round, and reactive to light. Conjunctivae, EOM and lids are normal.  Neck: No JVD present. Carotid bruit is not present. No edema present. No thyroid mass and no thyromegaly present.  Cardiovascular: S1 normal and S2 normal. Exam reveals no gallop.  No murmur heard. Pulses:      Dorsalis pedis pulses are 2+ on the right side.  Respiratory: No respiratory distress. He has no wheezes. He has no rhonchi. He has no rales.  GI: Soft. Bowel sounds are normal. There is no tenderness.  Musculoskeletal:       Left knee: He exhibits swelling.       Right ankle: He exhibits no swelling.  Lymphadenopathy:    He has no cervical adenopathy.  Neurological: He is alert. No cranial nerve deficit.  Skin: Skin is warm. No rash noted. Nails  show no clubbing.  Psychiatric: He has a normal mood and affect.      Data Reviewed: Basic Metabolic Panel: Recent Labs  Lab 12/15/17 1052 12/16/17 0420 12/17/17 0340 12/18/17 0630 12/19/17 0606  NA  --  133* 133* 132* 135  K  --  4.5 4.1 3.7 3.9  CL  --  106 105 103 106  CO2  --  22 22 25 26   GLUCOSE  --  131* 106* 118* 109*  BUN  --  26* 33* 22* 21*  CREATININE 1.46* 1.25* 1.18 0.99 1.03  CALCIUM  --  7.9* 8.0* 8.2* 8.3*  MG  --   --   --  1.3* 2.0   CBC: Recent Labs  Lab 12/15/17 1052 12/16/17 0420 12/17/17 0340 12/18/17 0630  WBC 7.4 9.3 7.4 8.8  HGB 10.9* 10.3* 10.0* 10.0*  HCT 32.5* 30.2* 29.4* 29.3*  MCV 92.4 93.4 92.1 92.1  PLT 232 230 236 229   CBG: Recent Labs  Lab 12/15/17 0611 12/15/17 0909 12/17/17 0640 12/18/17 0749  GLUCAP 76 102* 81 100*     Scheduled Meds: . amLODipine  2.5 mg Oral Daily  . apixaban  2.5 mg Oral BID  . B-complex with vitamin C  1 tablet Oral Daily  . cholecalciferol  1,000 Units Oral Once per day on Mon Fri  . docusate sodium  100 mg Oral Daily  . feeding supplement (ENSURE ENLIVE)  237 mL Oral TID BM  .  fenofibrate  160 mg Oral Daily  . lisinopril  20 mg Oral Daily  . metoprolol tartrate  12.5 mg Oral BID  . multivitamin with minerals  1 tablet Oral Daily  . nutrition supplement (JUVEN)  1 packet Oral BID BM  . omega-3 acid ethyl esters  1,000 mg Oral Daily  . ondansetron (ZOFRAN) IV  4 mg Intravenous Q8H  . pentoxifylline  400 mg Oral TID WC  . rosuvastatin  40 mg Oral Daily  . vancomycin  125 mg Oral Q6H   Continuous Infusions: . famotidine (PEPCID) IV Stopped (12/19/17 1024)    Assessment/Plan:  1. C. difficile colitis.  Continue oral vancomycin.  Diarrhea is starting to slow down than previous.  Hopefully will be able to go to rehab Monday depending on how much diarrhea today. 2. Hypomagnesemia and hypokalemia.  Replaced 3. Gangrene of the left lower extremity status post left below-knee  amputation 4. Hyponatremia.  Discontinue hydrochlorothiazide.  This should also help the magnesium and potassium too. 5. Acute kidney injury improved with IV fluids 6. Bradycardia with atrial flutter on Eliquis.  Decrease dose of metoprolol 7. Hypertension.  Cut back on dosages of Norvasc and lisinopril 8. Hyperlipidemia unspecified on Crestor and fenofibrate 9. Type 2 diabetes mellitus.  All sugars are good with diet control.  Last hemoglobin A1c 6.0.  Code Status:     Code Status Orders  (From admission, onward)        Start     Ordered   12/15/17 1045  Full code  Continuous     12/15/17 1044    Code Status History    Date Active Date Inactive Code Status Order ID Comments User Context   11/10/2017 1737 11/16/2017 1744 Full Code [id number]  [name], MD Inpatient   06/27/2015 1123 06/27/2015 1813 Full Code [id number]  Algernon [name], MD Inpatient     Family Communication: Family at the bedside Disposition Plan: If diarrhea subsides may be able to go out to rehab tomorrow  Antibiotics:  Oral vancomycin  Time spent: 27 minutes  [place]

## 2017-12-20 MED ORDER — TAMSULOSIN HCL 0.4 MG PO CAPS
0.4000 mg | ORAL_CAPSULE | Freq: Every day | ORAL | Status: DC
Start: 2017-12-20 — End: 2017-12-21
  Administered 2017-12-20: 0.4 mg via ORAL
  Filled 2017-12-20: qty 1

## 2017-12-20 NOTE — Progress Notes (Signed)
Patient ID: Eric [name]., male   DOB: [birth year], 84 y.o.   MRN: [id number]   [facility] PROGRESS NOTE  Eric [name]. [id number] DOB: 1928-05-23 DOA: 12/15/2017 PCP: [name], MD  HPI/Subjective: Patient feeling better.  Less diarrhea.  I did not realize the telemetry sitter was in the room this was discontinued today.  Objective: Vitals:   12/20/17 1135 12/20/17 1507  BP: 132/70 (!) 128/51  Pulse: 76 64  Resp:  18  Temp:  98.4 F (36.9 C)  SpO2:  100%    Filed Weights   12/15/17 1100  Weight: 78.8 kg (173 lb 11.6 oz)    ROS: Review of Systems  Constitutional: Negative for chills and fever.  Eyes: Negative for blurred vision.  Respiratory: Negative for cough and shortness of breath.   Cardiovascular: Negative for chest pain.  Gastrointestinal: Positive for abdominal pain and diarrhea. Negative for constipation, nausea and vomiting.  Genitourinary: Negative for dysuria.  Musculoskeletal: Negative for joint pain.  Neurological: Negative for dizziness and headaches.   Exam: Physical Exam  HENT:  Nose: No mucosal edema.  Mouth/Throat: No oropharyngeal exudate or posterior oropharyngeal edema.  Eyes: Pupils are equal, round, and reactive to light. Conjunctivae, EOM and lids are normal.  Neck: No JVD present. Carotid bruit is not present. No edema present. No thyroid mass and no thyromegaly present.  Cardiovascular: S1 normal and S2 normal. Exam reveals no gallop.  No murmur heard. Pulses:      Dorsalis pedis pulses are 2+ on the right side.  Respiratory: No respiratory distress. He has no wheezes. He has no rhonchi. He has no rales.  GI: Soft. Bowel sounds are normal. There is no tenderness.  Musculoskeletal:       Left knee: He exhibits swelling.       Right ankle: He exhibits no swelling.  Lymphadenopathy:    He has no cervical adenopathy.  Neurological: He is alert. No cranial nerve deficit.  Skin: Skin is warm. No rash noted. Nails  show no clubbing.  Psychiatric: He has a normal mood and affect.      Data Reviewed: Basic Metabolic Panel: Recent Labs  Lab 12/15/17 1052 12/16/17 0420 12/17/17 0340 12/18/17 0630 12/19/17 0606  NA  --  133* 133* 132* 135  K  --  4.5 4.1 3.7 3.9  CL  --  106 105 103 106  CO2  --  22 22 25 26   GLUCOSE  --  131* 106* 118* 109*  BUN  --  26* 33* 22* 21*  CREATININE 1.46* 1.25* 1.18 0.99 1.03  CALCIUM  --  7.9* 8.0* 8.2* 8.3*  MG  --   --   --  1.3* 2.0   CBC: Recent Labs  Lab 12/15/17 1052 12/16/17 0420 12/17/17 0340 12/18/17 0630  WBC 7.4 9.3 7.4 8.8  HGB 10.9* 10.3* 10.0* 10.0*  HCT 32.5* 30.2* 29.4* 29.3*  MCV 92.4 93.4 92.1 92.1  PLT 232 230 236 229   CBG: Recent Labs  Lab 12/15/17 0611 12/15/17 0909 12/17/17 0640 12/18/17 0749  GLUCAP 76 102* 81 100*     Scheduled Meds: . amLODipine  2.5 mg Oral Daily  . apixaban  2.5 mg Oral BID  . B-complex with vitamin C  1 tablet Oral Daily  . cholecalciferol  1,000 Units Oral Once per day on Mon Fri  . docusate sodium  100 mg Oral Daily  . feeding supplement (ENSURE ENLIVE)  237 mL Oral TID BM  .  fenofibrate  160 mg Oral Daily  . lisinopril  20 mg Oral Daily  . metoprolol tartrate  12.5 mg Oral BID  . multivitamin with minerals  1 tablet Oral Daily  . nutrition supplement (JUVEN)  1 packet Oral BID BM  . omega-3 acid ethyl esters  1,000 mg Oral Daily  . ondansetron (ZOFRAN) IV  4 mg Intravenous Q8H  . pentoxifylline  400 mg Oral TID WC  . rosuvastatin  40 mg Oral Daily  . vancomycin  125 mg Oral Q6H   Continuous Infusions: . famotidine (PEPCID) IV Stopped (12/20/17 0831)    Assessment/Plan:  1. C. difficile colitis.  Continue oral vancomycin.  Diarrhea  she has slowed down and started to form.  Was hoping to get out of hospital today to rehab but there was a [employer] manager.  Unable to get out to rehab today.  2. Hypomagnesemia and hypokalemia.  Replaced 3. Gangrene of the left lower extremity status  post left below-knee amputation 4. Hyponatremia.  Discontinued hydrochlorothiazide.  This should also help the magnesium and potassium too. 5. Acute kidney injury improved with IV fluids 6. Bradycardia with atrial flutter on Eliquis.  Decrease dose of metoprolol 7. Hypertension.  Cut back on dosages of Norvasc and lisinopril 8. Hyperlipidemia unspecified on Crestor and fenofibrate 9. Type 2 diabetes mellitus.  All sugars are good with diet control.  Last hemoglobin A1c 6.0. 10. Discontinue foley  Code Status:     Code Status Orders  (From admission, onward)        Start     Ordered   12/15/17 1045  Full code  Continuous     12/15/17 1044    Code Status History    Date Active Date Inactive Code Status Order ID Comments User Context   11/10/2017 1737 11/16/2017 1744 Full Code [id number]  [name], MD Inpatient   06/27/2015 1123 06/27/2015 1813 Full Code [id number]  Algernon [name], MD Inpatient     Family Communication: Family at the bedside Disposition Plan: Out to rehab tomorrow.  Discontinue Foley.  Antibiotics:  Oral vancomycin  Time spent: 26 minutes  [place]

## 2017-12-20 NOTE — Clinical Social Work Note (Signed)
Patient could not discharge because no one discontinued the tele sitter. Tele sitter discontinued today and will need to be without it for 24 hours prior to transfer to [facility]. Shela Leff MSW,LCSW [phone]

## 2017-12-20 NOTE — Progress Notes (Signed)
Physical Therapy Treatment Patient Details Name: Eric [name]. MRN: [id number] DOB: Dec 29, 1927 Today's Date: 12/20/2017    History of Present Illness Patient is an 84 year old male who was admitted s/p L BKA following gangrene of the foot.  PMH includes PVD, hyperglycemia, persistent atrial fibrillation, NM disorder, DM, CKD III, cataracts, CAD and atrial flutter.    PT Comments    Pt reported increased pain last night in LE but has improved this afternoon.  Participated in exercises as described below. Transferred to drop arm recliner at bedside with set up and min guard x 1.  No physical assist but requires +1 for balance.  Good safety awareness by pt.  Discussed standing attempts with walker today.  Pt hesitant stating his RLE is weak and has "never been strong".  Pt voiced fear it would not hold him.  Deferred standing trials at this time as pt may do better in parallel bar set up for extra support and safety.  Anticipate discharge to rehab tomorrow.   Follow Up Recommendations  SNF     Equipment Recommendations       Recommendations for Other Services       Precautions / Restrictions Precautions Precautions: Fall Precaution Comments: enteric Restrictions Weight Bearing Restrictions: Yes LLE Weight Bearing: Non weight bearing    Mobility  Bed Mobility Overal bed mobility: Needs Assistance Bed Mobility: Supine to Sit           General bed mobility comments: Supervison - unsafe to do alone due to balance  Transfers Overall transfer level: Needs assistance Equipment used: None Transfers: Lateral/Scoot Transfers          Lateral/Scoot Transfers: Min guard;Min assist General transfer comment: Pt able to scoot today with no physcial assist but min gaurd for balance.    Ambulation/Gait                 Stairs             Wheelchair Mobility    Modified Rankin (Stroke Patients Only)       Balance Overall balance assessment: Needs  assistance Sitting-balance support: Feet supported;Feet unsupported Sitting balance-Leahy Scale: Fair Sitting balance - Comments: continues to need min gaurd for transfers due to balance with lateral scoot. good safety awareness       Standing balance comment: deferred                            Cognition Arousal/Alertness: Awake/alert Behavior During Therapy: WFL for tasks assessed/performed Overall Cognitive Status: Within Functional Limits for tasks assessed                                        Exercises Other Exercises Other Exercises: BLE arom in supine and sitting for quad sets, SLR, Ab/Ad, hip/knee flexion, LAQ, LE push downs into bed - 2 x 10 Other Exercises: Pt instructed in importance of keeping L knee straight to minimize risk of contracture    General Comments        Pertinent Vitals/Pain Pain Assessment: 0-10 Pain Score: 2  Pain Location: Area of surgical site L LE Pain Descriptors / Indicators: Sore    Home Living                      Prior Function  PT Goals (current goals can now be found in the care plan section) Progress towards PT goals: Progressing toward goals    Frequency    7X/week      PT Plan Current plan remains appropriate    Co-evaluation              AM-PAC PT "6 Clicks" Daily Activity  Outcome Measure  Difficulty turning over in bed (including adjusting bedclothes, sheets and blankets)?: None Difficulty moving from lying on back to sitting on the side of the bed? : A Little Difficulty sitting down on and standing up from a chair with arms (e.g., wheelchair, bedside commode, etc,.)?: Unable Help needed moving to and from a bed to chair (including a wheelchair)?: Total Help needed walking in hospital room?: Total Help needed climbing 3-5 steps with a railing? : Total 6 Click Score: 11    End of Session Equipment Utilized During Treatment: Gait belt Activity Tolerance:  Patient tolerated treatment well Patient left: in chair;with call bell/phone within reach;with chair alarm set         Time: 1350-1414 PT Time Calculation (min) (ACUTE ONLY): 24 min  Charges:  $Therapeutic Exercise: 8-22 mins $Therapeutic Activity: 8-22 mins                    G Codes:       Chesley Noon, PTA 12/20/17, 2:29 PM

## 2017-12-20 NOTE — Progress Notes (Signed)
[facility]  Daily Progress Note   Subjective  - 5 Days Post-Op  Doing better today.  Some pain overnight.  Diarrhea better    Objective Vitals:   12/19/17 1437 12/19/17 2020 12/20/17 0646 12/20/17 1135  BP: (!) 136/50 (!) 132/53 (!) 154/71 132/70  Pulse: 64 75 78 76  Resp: 16  16   Temp: 98.1 F (36.7 C) 98.9 F (37.2 C) 98.5 F (36.9 C)   TempSrc: Oral Oral Oral   SpO2: 100% 99% 99%   Weight:      Height:        Intake/Output Summary (Last 24 hours) at 12/20/2017 1359 Last data filed at 12/20/2017 0800 Gross per 24 hour  Intake -  Output 2615 ml  Net -2615 ml    PULM  CTAB CV  RRR VASC  Dressing removed and wound looks good.  No bleeding drainage, or erythema  Laboratory CBC    Component Value Date/Time   WBC 8.8 12/18/2017 0630   HGB 10.0 (L) 12/18/2017 0630   HCT 29.3 (L) 12/18/2017 0630   PLT 229 12/18/2017 0630    BMET    Component Value Date/Time   NA 135 12/19/2017 0606   NA 139 04/28/2012 0658   K 3.9 12/19/2017 0606   K 4.2 04/28/2012 0658   CL 106 12/19/2017 0606   CL 106 04/28/2012 0658   CO2 26 12/19/2017 0606   CO2 25 04/28/2012 0658   GLUCOSE 109 (H) 12/19/2017 0606   GLUCOSE 114 (H) 04/28/2012 0658   BUN 21 (H) 12/19/2017 0606   BUN 32 (H) 04/28/2012 0658   CREATININE 1.03 12/19/2017 0606   CREATININE 1.44 (H) 12/10/2017 1038   CALCIUM 8.3 (L) 12/19/2017 0606   CALCIUM 8.9 04/28/2012 0658   GFRNONAA >60 12/19/2017 0606   GFRNONAA 43 (L) 12/10/2017 1038   GFRAA >60 12/19/2017 0606   GFRAA 50 (L) 12/10/2017 1038    Assessment/Planning: POD #5 s/p left BKA   Doing well with PT  C. Diff colitis. On oral VANC and seems to be doing better  Likely discharge to rehab tomorrow    Leotis Pain  12/20/2017, 1:59 PM

## 2017-12-21 ENCOUNTER — Inpatient Hospital Stay
Admission: RE | Admit: 2017-12-21 | Discharge: 2017-12-22 | Disposition: A | Discharge status: Other Acute Inpatient Hospital | Payer: PPO | Source: Ambulatory Visit | Attending: Internal Medicine | Admitting: Internal Medicine

## 2017-12-21 DIAGNOSIS — Z96652 Presence of left artificial knee joint: Secondary | ICD-10-CM | POA: Diagnosis not present

## 2017-12-21 DIAGNOSIS — I481 Persistent atrial fibrillation: Secondary | ICD-10-CM | POA: Diagnosis not present

## 2017-12-21 DIAGNOSIS — Z7901 Long term (current) use of anticoagulants: Secondary | ICD-10-CM | POA: Diagnosis not present

## 2017-12-21 DIAGNOSIS — N182 Chronic kidney disease, stage 2 (mild): Secondary | ICD-10-CM | POA: Diagnosis not present

## 2017-12-21 DIAGNOSIS — N390 Urinary tract infection, site not specified: Secondary | ICD-10-CM | POA: Diagnosis not present

## 2017-12-21 DIAGNOSIS — R509 Fever, unspecified: Secondary | ICD-10-CM | POA: Diagnosis not present

## 2017-12-21 DIAGNOSIS — I2583 Coronary atherosclerosis due to lipid rich plaque: Secondary | ICD-10-CM | POA: Diagnosis not present

## 2017-12-21 DIAGNOSIS — I96 Gangrene, not elsewhere classified: Secondary | ICD-10-CM | POA: Diagnosis not present

## 2017-12-21 DIAGNOSIS — D631 Anemia in chronic kidney disease: Secondary | ICD-10-CM | POA: Diagnosis not present

## 2017-12-21 DIAGNOSIS — E861 Hypovolemia: Secondary | ICD-10-CM | POA: Diagnosis not present

## 2017-12-21 DIAGNOSIS — E1122 Type 2 diabetes mellitus with diabetic chronic kidney disease: Secondary | ICD-10-CM | POA: Diagnosis not present

## 2017-12-21 DIAGNOSIS — E1165 Type 2 diabetes mellitus with hyperglycemia: Secondary | ICD-10-CM | POA: Diagnosis not present

## 2017-12-21 DIAGNOSIS — Z974 Presence of external hearing-aid: Secondary | ICD-10-CM | POA: Diagnosis not present

## 2017-12-21 DIAGNOSIS — R338 Other retention of urine: Secondary | ICD-10-CM | POA: Diagnosis not present

## 2017-12-21 DIAGNOSIS — S88112D Complete traumatic amputation at level between knee and ankle, left lower leg, subsequent encounter: Secondary | ICD-10-CM | POA: Diagnosis not present

## 2017-12-21 DIAGNOSIS — I10 Essential (primary) hypertension: Secondary | ICD-10-CM | POA: Diagnosis not present

## 2017-12-21 DIAGNOSIS — Z89512 Acquired absence of left leg below knee: Secondary | ICD-10-CM | POA: Diagnosis not present

## 2017-12-21 DIAGNOSIS — N401 Enlarged prostate with lower urinary tract symptoms: Secondary | ICD-10-CM | POA: Diagnosis not present

## 2017-12-21 DIAGNOSIS — E781 Pure hyperglyceridemia: Secondary | ICD-10-CM | POA: Diagnosis not present

## 2017-12-21 DIAGNOSIS — E1152 Type 2 diabetes mellitus with diabetic peripheral angiopathy with gangrene: Secondary | ICD-10-CM | POA: Diagnosis not present

## 2017-12-21 DIAGNOSIS — E871 Hypo-osmolality and hyponatremia: Secondary | ICD-10-CM | POA: Diagnosis not present

## 2017-12-21 DIAGNOSIS — Z951 Presence of aortocoronary bypass graft: Secondary | ICD-10-CM | POA: Diagnosis not present

## 2017-12-21 DIAGNOSIS — B965 Pseudomonas (aeruginosa) (mallei) (pseudomallei) as the cause of diseases classified elsewhere: Secondary | ICD-10-CM | POA: Diagnosis not present

## 2017-12-21 DIAGNOSIS — Z87891 Personal history of nicotine dependence: Secondary | ICD-10-CM | POA: Diagnosis not present

## 2017-12-21 DIAGNOSIS — Z978 Presence of other specified devices: Secondary | ICD-10-CM | POA: Diagnosis not present

## 2017-12-21 DIAGNOSIS — N183 Chronic kidney disease, stage 3 (moderate): Secondary | ICD-10-CM | POA: Diagnosis not present

## 2017-12-21 DIAGNOSIS — E876 Hypokalemia: Secondary | ICD-10-CM | POA: Diagnosis not present

## 2017-12-21 DIAGNOSIS — A498 Other bacterial infections of unspecified site: Secondary | ICD-10-CM | POA: Diagnosis not present

## 2017-12-21 DIAGNOSIS — R262 Difficulty in walking, not elsewhere classified: Secondary | ICD-10-CM | POA: Diagnosis not present

## 2017-12-21 DIAGNOSIS — R339 Retention of urine, unspecified: Secondary | ICD-10-CM | POA: Diagnosis not present

## 2017-12-21 DIAGNOSIS — R001 Bradycardia, unspecified: Secondary | ICD-10-CM | POA: Diagnosis not present

## 2017-12-21 DIAGNOSIS — K8020 Calculus of gallbladder without cholecystitis without obstruction: Secondary | ICD-10-CM | POA: Diagnosis not present

## 2017-12-21 DIAGNOSIS — I739 Peripheral vascular disease, unspecified: Secondary | ICD-10-CM | POA: Diagnosis not present

## 2017-12-21 DIAGNOSIS — Z7401 Bed confinement status: Secondary | ICD-10-CM | POA: Diagnosis not present

## 2017-12-21 DIAGNOSIS — I2510 Atherosclerotic heart disease of native coronary artery without angina pectoris: Secondary | ICD-10-CM | POA: Diagnosis not present

## 2017-12-21 DIAGNOSIS — I129 Hypertensive chronic kidney disease with stage 1 through stage 4 chronic kidney disease, or unspecified chronic kidney disease: Secondary | ICD-10-CM | POA: Diagnosis not present

## 2017-12-21 DIAGNOSIS — A0472 Enterocolitis due to Clostridium difficile, not specified as recurrent: Secondary | ICD-10-CM | POA: Diagnosis not present

## 2017-12-21 DIAGNOSIS — A419 Sepsis, unspecified organism: Secondary | ICD-10-CM | POA: Diagnosis not present

## 2017-12-21 DIAGNOSIS — R918 Other nonspecific abnormal finding of lung field: Secondary | ICD-10-CM | POA: Diagnosis not present

## 2017-12-21 DIAGNOSIS — M6281 Muscle weakness (generalized): Secondary | ICD-10-CM | POA: Diagnosis not present

## 2017-12-21 DIAGNOSIS — Z79899 Other long term (current) drug therapy: Secondary | ICD-10-CM | POA: Diagnosis not present

## 2017-12-21 DIAGNOSIS — L8990 Pressure ulcer of unspecified site, unspecified stage: Secondary | ICD-10-CM | POA: Diagnosis not present

## 2017-12-21 DIAGNOSIS — R279 Unspecified lack of coordination: Secondary | ICD-10-CM | POA: Diagnosis not present

## 2017-12-21 DIAGNOSIS — D649 Anemia, unspecified: Secondary | ICD-10-CM | POA: Diagnosis not present

## 2017-12-21 DIAGNOSIS — G4733 Obstructive sleep apnea (adult) (pediatric): Secondary | ICD-10-CM | POA: Diagnosis not present

## 2017-12-21 DIAGNOSIS — G4739 Other sleep apnea: Secondary | ICD-10-CM | POA: Diagnosis not present

## 2017-12-21 MED ORDER — FINASTERIDE 5 MG PO TABS: 5.0000 mg | ORAL_TABLET | Freq: Every day | ORAL | 0 refills | Status: DC

## 2017-12-21 MED ORDER — AMLODIPINE BESYLATE 2.5 MG PO TABS: 2.5000 mg | ORAL_TABLET | Freq: Every day | ORAL | 0 refills | Status: DC

## 2017-12-21 MED ORDER — FINASTERIDE 5 MG PO TABS: 5.0000 mg | ORAL_TABLET | Freq: Every day | ORAL | Status: DC

## 2017-12-21 MED ORDER — LISINOPRIL 20 MG PO TABS: 20.0000 mg | ORAL_TABLET | Freq: Every day | ORAL | 0 refills | Status: DC

## 2017-12-21 MED ORDER — OXYCODONE HCL 5 MG PO TABS: ORAL_TABLET | ORAL | 0 refills | Status: DC

## 2017-12-21 MED ORDER — VANCOMYCIN 50 MG/ML ORAL SOLUTION: 125.0000 mg | Freq: Four times a day (QID) | ORAL | 0 refills | Status: DC

## 2017-12-21 MED ORDER — METOPROLOL TARTRATE 25 MG PO TABS: 12.5000 mg | ORAL_TABLET | Freq: Two times a day (BID) | ORAL | 0 refills | Status: DC

## 2017-12-21 MED ORDER — TAMSULOSIN HCL 0.4 MG PO CAPS: 0.4000 mg | ORAL_CAPSULE | Freq: Every day | ORAL | 0 refills | Status: DC

## 2017-12-21 MED ORDER — ACETAMINOPHEN 325 MG PO TABS: 650.0000 mg | ORAL_TABLET | Freq: Four times a day (QID) | ORAL | Status: AC | PRN

## 2017-12-21 NOTE — Clinical Social Work Note (Signed)
Patient discharged earlier this morning approximately at 10:30 am. Patient's nurse stated that she did not place the pain med script in patient's packet before he left. CSW left message for [facility] Admission's Coordinator regarding the pain med script not going with patient. The Admission's Coordinator never returned [place] phone call and so CSW attempted to call multiple times to [facility] this afternoon and the receptionist answering kept transferring me to different people but no voice mails picked up. CSW will attempt to call in the morning. Shela Leff MSW,LCSW [phone]

## 2017-12-21 NOTE — Clinical Social Work Note (Signed)
Physician discharging patient. CSW has notified admission's coordinator at [facility] and sent discharge information. Patient's family is aware of discharge and nurse to call report and patient to transport via EMS.  Shela Leff MSW,LCSW [phone]

## 2017-12-21 NOTE — Progress Notes (Signed)
Report given to EMS. Update given to pt. All questions answered to pt satisfaction.  IV removed. Pt discharged via EMS.

## 2017-12-21 NOTE — Discharge Summary (Signed)
[facility] [place] NAME: Eric [name]    MR#:  [id number]  DATE OF BIRTH:  [birth year]  DATE OF ADMISSION:  12/15/2017 ADMITTING PHYSICIAN: Algernon [name], MD  DATE OF DISCHARGE: 12/21/2017  PRIMARY CARE PHYSICIAN: [name], MD    ADMISSION DIAGNOSIS:  ASO WITH GANGRENE  DISCHARGE DIAGNOSIS:  Active Problems:   Gangrene of left foot ([place])   SECONDARY DIAGNOSIS:   Past Medical History:  Diagnosis Date  . Atrial flutter ([place])    s/p RFCA  . CAD (coronary artery disease)    cath 2003, occluded S-RCA, occluded S-Dx, L-LAD ok, s/p PTCA to LAD  . Cataract   . CKD (chronic kidney disease) stage 3, GFR 30-59 ml/min (HCC)   . Diabetes mellitus    diet controlled  . Hearing aid worn    bilateral  . History of kidney stones   . Long term (current) use of anticoagulants   . Neuromuscular disorder ([place])   . OSA (obstructive sleep apnea) 12/11   very mild, AHI 7/hr  . Persistent atrial fibrillation ([place]) 09/26/2015  . Pure hyperglyceridemia   . PVD (peripheral vascular disease) ([place])    angioplasty of his right lower extremity in [place] by Dr.[name] 2013  . Unspecified essential hypertension   . Wears dentures    full upper and lower    HOSPITAL COURSE:   1.  C. difficile colitis.  Continue oral vancomycin.  Diarrhea has slowed down and starting to form.  Continue vancomycin orally for another 11 days.  I prescribed the liquid form since this is what we have in our hospital but it is okay to substitute with pills.  Dosage is 125 mg orally 4 times a day. 2.  Hypomagnesemia and hypokalemia this was replaced during the hospital course.  Hydrochlorothiazide was discontinued. 3.  Gangrene of the left lower extremity status post below knee amputation on the left.  Patient will go out to rehab.  When swelling goes down can potentially be set up for prosthesis.  Follow-up with Dr. do as outpatient 4.  Hyponatremia.  Discontinue  hydrochlorothiazide 5.  Bradycardia with atrial  fibrillation on Eliquis.  I decrease the dose of the metoprolol.  Heart rate has improved.  6.  Acute kidney injury improved with IV fluids 7.  Essential hypertension I cut back on the dosages of Norvasc, lisinopril and metoprolol. 8.  Hyperlipidemia unspecified on Crestor and fenofibrate 9.  Impaired fasting glucose.  Last hemoglobin A1c 6.0 just watch with diet at this point. 10.  Urinary retention.  Started on Flomax and finasteride.Foley catheter had to be replaced.  Can follow-up with urology as outpatient.  Will need a voiding trial in 9 to 10 days.   DISCHARGE CONDITIONS:   Satisfactory  CONSULTS OBTAINED:  Vascular surgery  DRUG ALLERGIES:   Allergies  Allergen Reactions  . Isosorbide Mononitrate Other (See Comments)    Unknown, doesn't remember   . Ramipril Cough  . Quinidine Gluconate Rash    DISCHARGE MEDICATIONS:   Allergies as of 12/21/2017      Reactions   Isosorbide Mononitrate Other (See Comments)   Unknown, doesn't remember    Ramipril Cough   Quinidine Gluconate Rash      Medication List    STOP taking these medications   amoxicillin-clavulanate 875-125 MG tablet Commonly known as:  AUGMENTIN   hydrochlorothiazide 25 MG tablet Commonly known as:  HYDRODIURIL     TAKE these medications  acetaminophen 325 MG tablet Commonly known as:  TYLENOL Take 2 tablets (650 mg total) by mouth every 6 (six) hours as needed for mild pain (or temp >/= 101 F). What changed:    medication strength  how much to take  when to take this  reasons to take this   amLODipine 2.5 MG tablet Commonly known as:  NORVASC Take 1 tablet (2.5 mg total) by mouth daily. What changed:    medication strength  how much to take   apixaban 2.5 MG Tabs tablet Commonly known as:  ELIQUIS Take 1 tablet (2.5 mg total) by mouth 2 (two) times daily.   B Complex-B12 Tabs Take 1 tablet by mouth daily.   calcium carbonate  500 MG chewable tablet Commonly known as:  TUMS - dosed in mg elemental calcium Chew 1 tablet by mouth daily as needed for indigestion or heartburn.   cholecalciferol 1000 units tablet Commonly known as:  VITAMIN D Take 1,000 Units by mouth 2 (two) times a week. Takes on Mon and Fri   CINNAMON PO Take 1,000 mg by mouth 2 (two) times daily.   DAILY MULTIVITAMIN PO Take 1 tablet by mouth daily.   fenofibrate 160 MG tablet Take 1 tablet (160 mg total) by mouth daily.   finasteride 5 MG tablet Commonly known as:  PROSCAR Take 1 tablet (5 mg total) by mouth daily.   fish oil-omega-3 fatty acids 1000 MG capsule Take 1,000 mg by mouth daily.   glucose blood test strip Use as instructed   lisinopril 20 MG tablet Commonly known as:  PRINIVIL,ZESTRIL Take 1 tablet (20 mg total) by mouth daily. What changed:    medication strength  how much to take   metoprolol tartrate 25 MG tablet Commonly known as:  LOPRESSOR Take 0.5 tablets (12.5 mg total) by mouth 2 (two) times daily. What changed:    medication strength  how much to take   nitroGLYCERIN 0.2 mg/hr patch Commonly known as:  NITRODUR - Dosed in mg/24 hr Place 1 patch (0.2 mg total) onto the skin daily.   nutrition supplement (JUVEN) Pack Take 1 packet by mouth 2 (two) times daily between meals.   ONE TOUCH ULTRA SYSTEM KIT w/Device Kit 1 kit by Does not apply route once.   ONETOUCH DELICA LANCETS FINE Misc check sugar once daily   oxyCODONE 5 MG immediate release tablet Commonly known as:  Oxy IR/ROXICODONE One tab every six hours as needed for pain   pentoxifylline 400 MG CR tablet Commonly known as:  TRENTAL Take 1 tablet (400 mg total) by mouth 3 (three) times daily with meals.   rosuvastatin 40 MG tablet Commonly known as:  CRESTOR Take 1 tablet (40 mg total) by mouth daily.   tamsulosin 0.4 MG Caps capsule Commonly known as:  FLOMAX Take 1 capsule (0.4 mg total) by mouth daily.   vancomycin 50  mg/mL oral solution Commonly known as:  VANCOCIN Take 2.5 mLs (125 mg total) by mouth every 6 (six) hours for 11 days.        DISCHARGE INSTRUCTIONS:    Follow-up with Dr. at rehab 1 day Follow-up with vascular surgery 10 days Follow-up with urology 9 days   If you experience worsening of your admission symptoms, develop shortness of breath, life threatening emergency, suicidal or homicidal thoughts you must seek medical attention immediately by calling 911 or calling your MD immediately  if symptoms less severe.  You Must read complete instructions/literature along with all the possible  adverse reactions/side effects for all the Medicines you take and that have been prescribed to you. Take any new Medicines after you have completely understood and accept all the possible adverse reactions/side effects.   Please note  You were cared for by a hospitalist during your hospital stay. If you have any questions about your discharge medications or the care you received while you were in the hospital after you are discharged, you can call the unit and asked to speak with the hospitalist on call if the hospitalist that took care of you is not available. Once you are discharged, your primary care physician will handle any further medical issues. Please note that NO REFILLS for any discharge medications will be authorized once you are discharged, as it is imperative that you return to your primary care physician (or establish a relationship with a primary care physician if you do not have one) for your aftercare needs so that they can reassess your need for medications and monitor your lab values.    Today   CHIEF COMPLAINT:  No chief complaint on file.   HISTORY OF PRESENT ILLNESS:  Eric [name]  is a 84 y.o. male presented with gangrene   VITAL SIGNS:  Blood pressure 129/65, pulse 67, temperature 98.3 F (36.8 C), temperature source Oral, resp. rate 16, height '5\' 5"'  (1.651 m), weight  78.8 kg (173 lb 11.6 oz), SpO2 100 %.    PHYSICAL EXAMINATION:  GENERAL:  84 y.o.-year-old patient lying in the bed with no acute distress.  EYES: Pupils equal, round, reactive to light and accommodation. No scleral icterus. Extraocular muscles intact.  HEENT: Head atraumatic, normocephalic. Oropharynx and nasopharynx clear.  NECK:  Supple, no jugular venous distention. No thyroid enlargement, no tenderness.  LUNGS: Normal breath sounds bilaterally, no wheezing, rales,rhonchi or crepitation. No use of accessory muscles of respiration.  CARDIOVASCULAR: S1, S2 normal. No murmurs, rubs, or gallops.  ABDOMEN: Soft, non-tender, non-distended. Bowel sounds present. No organomegaly or mass.  EXTREMITIES: No pedal edema, cyanosis, or clubbing.  NEUROLOGIC: Cranial nerves II through XII are intact. Muscle strength 5/5 in all extremities. Sensation intact. Gait not checked.  PSYCHIATRIC: The patient is alert and oriented x 3.  SKIN:  left leg stump covered with Kerlix wrap  DATA REVIEW:   CBC Recent Labs  Lab 12/18/17 0630  WBC 8.8  HGB 10.0*  HCT 29.3*  PLT 229    Chemistries  Recent Labs  Lab 12/19/17 0606  NA 135  K 3.9  CL 106  CO2 26  GLUCOSE 109*  BUN 21*  CREATININE 1.03  CALCIUM 8.3*  MG 2.0     Microbiology Results  Results for orders placed or performed during the hospital encounter of 11/10/17  Blood culture (routine x 2)     Status: None   Collection Time: 11/10/17  1:08 PM  Result Value Ref Range Status   Specimen Description BLOOD RIGHT ANTECUBITAL  Final   Special Requests   Final    BOTTLES DRAWN AEROBIC AND ANAEROBIC Blood Culture adequate volume   Culture   Final    NO GROWTH 5 DAYS Performed at [facility], [place] [address], [place], [place] [address]    Report Status 11/15/2017 FINAL  Final  Blood culture (routine x 2)     Status: None   Collection Time: 11/10/17  1:08 PM  Result Value Ref Range Status   Specimen Description BLOOD BLOOD  RIGHT WRIST  Final   Special Requests  Final    BOTTLES DRAWN AEROBIC ONLY Blood Culture adequate volume   Culture   Final    NO GROWTH 5 DAYS Performed at [facility], [place] [address], [place], [place] [address]    Report Status 11/15/2017 FINAL  Final  Surgical pcr screen     Status: None   Collection Time: 11/14/17 10:17 PM  Result Value Ref Range Status   MRSA, PCR NEGATIVE NEGATIVE Final   Staphylococcus aureus NEGATIVE NEGATIVE Final    Comment: (NOTE) The Xpert SA Assay (FDA approved for NASAL specimens in patients 44 years of age and older), is one component of a comprehensive surveillance program. It is not intended to diagnose infection nor to guide or monitor treatment. Performed at [facility], [place] [address], [place], [place] [address]      Management plans discussed with the patient, family and they are in agreement.  CODE STATUS:     Code Status Orders  (From admission, onward)        Start     Ordered   12/15/17 1045  Full code  Continuous     12/15/17 1044    Code Status History    Date Active Date Inactive Code Status Order ID Comments User Context   11/10/2017 1737 11/16/2017 1744 Full Code [id number]  [name], MD Inpatient   06/27/2015 1123 06/27/2015 1813 Full Code [id number]  Algernon [name], MD Inpatient      TOTAL TIME TAKING CARE OF THIS PATIENT: 35 minutes.    Loletha Grayer M.D on 12/21/2017 at 7:55 AM  Between 7am to 6pm - Pager - [phone]  After 6pm go to [email or web address] - password [email or web address] [facility]  [phone]  CC: Primary care physician; [name], MD

## 2017-12-21 NOTE — Progress Notes (Signed)
Called report to [place] at [facility]. Called for EMS transport.

## 2017-12-22 ENCOUNTER — Inpatient Hospital Stay (HOSPITAL_COMMUNITY)
Admission: EM | Admit: 2017-12-22 | Discharge: 2017-12-25 | DRG: 690 | Disposition: A | Discharge status: Skilled Nursing Facility | Payer: PPO | Attending: Family Medicine | Admitting: Family Medicine

## 2017-12-22 ENCOUNTER — Encounter: Payer: Self-pay | Admitting: Internal Medicine

## 2017-12-22 ENCOUNTER — Non-Acute Institutional Stay (SKILLED_NURSING_FACILITY): Payer: PPO | Admitting: Internal Medicine

## 2017-12-22 ENCOUNTER — Inpatient Hospital Stay (HOSPITAL_COMMUNITY): Payer: PPO | Attending: Internal Medicine

## 2017-12-22 ENCOUNTER — Encounter (HOSPITAL_COMMUNITY): Payer: Self-pay

## 2017-12-22 ENCOUNTER — Emergency Department (HOSPITAL_COMMUNITY): Payer: PPO

## 2017-12-22 ENCOUNTER — Encounter (HOSPITAL_COMMUNITY)
Admission: AD | Admit: 2017-12-22 | Discharge: 2017-12-22 | Disposition: A | Discharge status: Home / Self Care | Payer: PPO | Source: Other Acute Inpatient Hospital | Attending: Internal Medicine | Admitting: Internal Medicine

## 2017-12-22 DIAGNOSIS — M6281 Muscle weakness (generalized): Secondary | ICD-10-CM | POA: Diagnosis not present

## 2017-12-22 DIAGNOSIS — Z978 Presence of other specified devices: Secondary | ICD-10-CM | POA: Diagnosis not present

## 2017-12-22 DIAGNOSIS — A419 Sepsis, unspecified organism: Secondary | ICD-10-CM

## 2017-12-22 DIAGNOSIS — R001 Bradycardia, unspecified: Secondary | ICD-10-CM | POA: Diagnosis not present

## 2017-12-22 DIAGNOSIS — E1152 Type 2 diabetes mellitus with diabetic peripheral angiopathy with gangrene: Secondary | ICD-10-CM | POA: Diagnosis not present

## 2017-12-22 DIAGNOSIS — I481 Persistent atrial fibrillation: Secondary | ICD-10-CM

## 2017-12-22 DIAGNOSIS — Z951 Presence of aortocoronary bypass graft: Secondary | ICD-10-CM | POA: Diagnosis not present

## 2017-12-22 DIAGNOSIS — E781 Pure hyperglyceridemia: Secondary | ICD-10-CM | POA: Diagnosis present

## 2017-12-22 DIAGNOSIS — D649 Anemia, unspecified: Secondary | ICD-10-CM

## 2017-12-22 DIAGNOSIS — Z96652 Presence of left artificial knee joint: Secondary | ICD-10-CM | POA: Diagnosis not present

## 2017-12-22 DIAGNOSIS — Z89512 Acquired absence of left leg below knee: Secondary | ICD-10-CM | POA: Diagnosis not present

## 2017-12-22 DIAGNOSIS — R509 Fever, unspecified: Secondary | ICD-10-CM | POA: Diagnosis not present

## 2017-12-22 DIAGNOSIS — S88112D Complete traumatic amputation at level between knee and ankle, left lower leg, subsequent encounter: Secondary | ICD-10-CM | POA: Diagnosis not present

## 2017-12-22 DIAGNOSIS — Z974 Presence of external hearing-aid: Secondary | ICD-10-CM | POA: Diagnosis not present

## 2017-12-22 DIAGNOSIS — Z7901 Long term (current) use of anticoagulants: Secondary | ICD-10-CM | POA: Diagnosis not present

## 2017-12-22 DIAGNOSIS — N390 Urinary tract infection, site not specified: Principal | ICD-10-CM | POA: Diagnosis present

## 2017-12-22 DIAGNOSIS — I4892 Unspecified atrial flutter: Secondary | ICD-10-CM | POA: Diagnosis present

## 2017-12-22 DIAGNOSIS — G4739 Other sleep apnea: Secondary | ICD-10-CM | POA: Diagnosis not present

## 2017-12-22 DIAGNOSIS — B965 Pseudomonas (aeruginosa) (mallei) (pseudomallei) as the cause of diseases classified elsewhere: Secondary | ICD-10-CM | POA: Diagnosis present

## 2017-12-22 DIAGNOSIS — I2510 Atherosclerotic heart disease of native coronary artery without angina pectoris: Secondary | ICD-10-CM | POA: Diagnosis present

## 2017-12-22 DIAGNOSIS — E861 Hypovolemia: Secondary | ICD-10-CM | POA: Diagnosis present

## 2017-12-22 DIAGNOSIS — I10 Essential (primary) hypertension: Secondary | ICD-10-CM

## 2017-12-22 DIAGNOSIS — N401 Enlarged prostate with lower urinary tract symptoms: Secondary | ICD-10-CM | POA: Diagnosis present

## 2017-12-22 DIAGNOSIS — G4733 Obstructive sleep apnea (adult) (pediatric): Secondary | ICD-10-CM | POA: Diagnosis not present

## 2017-12-22 DIAGNOSIS — E871 Hypo-osmolality and hyponatremia: Secondary | ICD-10-CM | POA: Diagnosis not present

## 2017-12-22 DIAGNOSIS — R338 Other retention of urine: Secondary | ICD-10-CM | POA: Diagnosis present

## 2017-12-22 DIAGNOSIS — L8990 Pressure ulcer of unspecified site, unspecified stage: Secondary | ICD-10-CM | POA: Diagnosis not present

## 2017-12-22 DIAGNOSIS — Z79899 Other long term (current) drug therapy: Secondary | ICD-10-CM

## 2017-12-22 DIAGNOSIS — A0472 Enterocolitis due to Clostridium difficile, not specified as recurrent: Secondary | ICD-10-CM

## 2017-12-22 DIAGNOSIS — I96 Gangrene, not elsewhere classified: Secondary | ICD-10-CM | POA: Diagnosis not present

## 2017-12-22 DIAGNOSIS — E1165 Type 2 diabetes mellitus with hyperglycemia: Secondary | ICD-10-CM | POA: Diagnosis not present

## 2017-12-22 DIAGNOSIS — R279 Unspecified lack of coordination: Secondary | ICD-10-CM | POA: Diagnosis not present

## 2017-12-22 DIAGNOSIS — I129 Hypertensive chronic kidney disease with stage 1 through stage 4 chronic kidney disease, or unspecified chronic kidney disease: Secondary | ICD-10-CM | POA: Diagnosis not present

## 2017-12-22 DIAGNOSIS — I2583 Coronary atherosclerosis due to lipid rich plaque: Secondary | ICD-10-CM | POA: Diagnosis not present

## 2017-12-22 DIAGNOSIS — I739 Peripheral vascular disease, unspecified: Secondary | ICD-10-CM | POA: Diagnosis not present

## 2017-12-22 DIAGNOSIS — Z87891 Personal history of nicotine dependence: Secondary | ICD-10-CM | POA: Diagnosis not present

## 2017-12-22 DIAGNOSIS — A498 Other bacterial infections of unspecified site: Secondary | ICD-10-CM | POA: Diagnosis not present

## 2017-12-22 DIAGNOSIS — D631 Anemia in chronic kidney disease: Secondary | ICD-10-CM | POA: Diagnosis not present

## 2017-12-22 DIAGNOSIS — N179 Acute kidney failure, unspecified: Secondary | ICD-10-CM | POA: Diagnosis present

## 2017-12-22 DIAGNOSIS — N183 Chronic kidney disease, stage 3 unspecified: Secondary | ICD-10-CM

## 2017-12-22 DIAGNOSIS — E1122 Type 2 diabetes mellitus with diabetic chronic kidney disease: Secondary | ICD-10-CM | POA: Diagnosis not present

## 2017-12-22 DIAGNOSIS — I4819 Other persistent atrial fibrillation: Secondary | ICD-10-CM

## 2017-12-22 DIAGNOSIS — R339 Retention of urine, unspecified: Secondary | ICD-10-CM | POA: Diagnosis not present

## 2017-12-22 DIAGNOSIS — N182 Chronic kidney disease, stage 2 (mild): Secondary | ICD-10-CM | POA: Diagnosis not present

## 2017-12-22 DIAGNOSIS — R918 Other nonspecific abnormal finding of lung field: Secondary | ICD-10-CM | POA: Diagnosis not present

## 2017-12-22 DIAGNOSIS — K8020 Calculus of gallbladder without cholecystitis without obstruction: Secondary | ICD-10-CM | POA: Diagnosis not present

## 2017-12-22 LAB — COMPREHENSIVE METABOLIC PANEL
ALBUMIN: 2.3 g/dL — AB (ref 3.5–5.0)
ALK PHOS: 71 U/L (ref 38–126)
ALT: 31 U/L (ref 17–63)
ALT: 32 U/L (ref 17–63)
ANION GAP: 8 (ref 5–15)
AST: 54 U/L — ABNORMAL HIGH (ref 15–41)
AST: 53 U/L — ABNORMAL HIGH (ref 15–41)
Albumin: 2.5 g/dL — ABNORMAL LOW (ref 3.5–5.0)
Alkaline Phosphatase: 76 U/L (ref 38–126)
Anion gap: 7 (ref 5–15)
BILIRUBIN TOTAL: 0.6 mg/dL (ref 0.3–1.2)
BUN: 24 mg/dL — ABNORMAL HIGH (ref 6–20)
BUN: 24 mg/dL — ABNORMAL HIGH (ref 6–20)
CALCIUM: 8.4 mg/dL — AB (ref 8.9–10.3)
CO2: 23 mmol/L (ref 22–32)
CO2: 24 mmol/L (ref 22–32)
CREATININE: 1 mg/dL (ref 0.61–1.24)
Calcium: 8.5 mg/dL — ABNORMAL LOW (ref 8.9–10.3)
Chloride: 96 mmol/L — ABNORMAL LOW (ref 101–111)
Chloride: 95 mmol/L — ABNORMAL LOW (ref 101–111)
Creatinine, Ser: 1.08 mg/dL (ref 0.61–1.24)
GFR calc Af Amer: >60 mL/min (ref >60)
GFR calc Af Amer: >60 mL/min (ref >60)
GFR calc non Af Amer: >60 mL/min (ref >60)
GFR calc non Af Amer: 60 mL/min — ABNORMAL LOW (ref >60)
GLUCOSE: 263 mg/dL — AB (ref 65–99)
GLUCOSE: 177 mg/dL — AB (ref 65–99)
POTASSIUM: 4.1 mmol/L (ref 3.5–5.1)
Potassium: 4.2 mmol/L (ref 3.5–5.1)
SODIUM: 126 mmol/L — AB (ref 135–145)
Sodium: 127 mmol/L — ABNORMAL LOW (ref 135–145)
TOTAL PROTEIN: 5.6 g/dL — AB (ref 6.5–8.1)
TOTAL PROTEIN: 6 g/dL — AB (ref 6.5–8.1)
Total Bilirubin: 0.9 mg/dL (ref 0.3–1.2)

## 2017-12-22 LAB — CBC WITH DIFFERENTIAL/PLATELET
BASOS ABS: 0 10*3/uL (ref 0.0–0.1)
BASOS PCT: 0 %
Basophils Absolute: 0 10*3/uL (ref 0.0–0.1)
Basophils Relative: 0 %
Eosinophils Absolute: 0.1 10*3/uL (ref 0.0–0.7)
Eosinophils Absolute: 0.1 10*3/uL (ref 0.0–0.7)
Eosinophils Relative: 0 %
Eosinophils Relative: 0 %
HEMATOCRIT: 27.9 % — AB (ref 39.0–52.0)
HEMATOCRIT: 28.3 % — AB (ref 39.0–52.0)
HEMOGLOBIN: 9.2 g/dL — AB (ref 13.0–17.0)
HEMOGLOBIN: 9.4 g/dL — AB (ref 13.0–17.0)
LYMPHS PCT: 10 %
LYMPHS PCT: 11 %
Lymphs Abs: 2.9 10*3/uL (ref 0.7–4.0)
Lymphs Abs: 3.4 10*3/uL (ref 0.7–4.0)
MCH: 30.1 pg (ref 26.0–34.0)
MCH: 30.6 pg (ref 26.0–34.0)
MCHC: 33 g/dL (ref 30.0–36.0)
MCHC: 33.2 g/dL (ref 30.0–36.0)
MCV: 91.2 fL (ref 78.0–100.0)
MCV: 92.2 fL (ref 78.0–100.0)
MONO ABS: 1.9 10*3/uL (ref 0.1–1.0)
MONOS PCT: 7 %
Monocytes Absolute: 2.3 10*3/uL — ABNORMAL HIGH (ref 0.1–1.0)
Monocytes Relative: 8 %
NEUTROS ABS: 24.4 10*3/uL (ref 1.7–7.7)
NEUTROS ABS: 24.9 10*3/uL — AB (ref 1.7–7.7)
NEUTROS PCT: 83 %
Neutrophils Relative %: 81 %
Platelets: 304 10*3/uL (ref 150–400)
Platelets: 315 10*3/uL (ref 150–400)
RBC: 3.06 MIL/uL — ABNORMAL LOW (ref 4.22–5.81)
RBC: 3.07 MIL/uL — AB (ref 4.22–5.81)
RDW: 15 % (ref 11.5–15.5)
RDW: 15.1 % (ref 11.5–15.5)
WBC: 29.3 10*3/uL — ABNORMAL HIGH (ref 4.0–10.5)
WBC: 30.7 10*3/uL — AB (ref 4.0–10.5)

## 2017-12-22 LAB — URINALYSIS, ROUTINE W REFLEX MICROSCOPIC
BILIRUBIN URINE: NEGATIVE
Glucose, UA: >=500 mg/dL — AB
KETONES UR: NEGATIVE mg/dL
Nitrite: NEGATIVE
Protein, ur: 100 mg/dL — AB
RBC / HPF: >50 RBC/hpf — ABNORMAL HIGH (ref 0–5)
SPECIFIC GRAVITY, URINE: 1.015 (ref 1.005–1.030)
WBC, UA: >50 WBC/hpf — ABNORMAL HIGH (ref 0–5)
pH: 5 (ref 5.0–8.0)

## 2017-12-22 LAB — I-STAT CG4 LACTIC ACID, ED: Lactic Acid, Venous: 1.49 mmol/L (ref 0.5–1.9)

## 2017-12-22 LAB — GLUCOSE, CAPILLARY: GLUCOSE-CAPILLARY: 132 mg/dL — AB (ref 65–99)

## 2017-12-22 MED ORDER — NITROGLYCERIN 0.2 MG/HR TD PT24
0.2000 mg | MEDICATED_PATCH | Freq: Every day | TRANSDERMAL | Status: DC
  Administered 2017-12-23 – 2017-12-25 (×3): 0.2 mg via TRANSDERMAL
  Filled 2017-12-22 (×5): qty 1

## 2017-12-22 MED ORDER — SODIUM CHLORIDE 0.9% FLUSH: 3.0000 mL | INTRAVENOUS | Status: DC | PRN

## 2017-12-22 MED ORDER — TAMSULOSIN HCL 0.4 MG PO CAPS
0.4000 mg | ORAL_CAPSULE | Freq: Every day | ORAL | Status: DC
  Administered 2017-12-23 – 2017-12-25 (×3): 0.4 mg via ORAL
  Filled 2017-12-22 (×3): qty 1

## 2017-12-22 MED ORDER — B COMPLEX-C PO TABS
1.0000 | ORAL_TABLET | Freq: Every day | ORAL | Status: DC
  Administered 2017-12-23 – 2017-12-25 (×3): 1 via ORAL
  Filled 2017-12-22 (×5): qty 1

## 2017-12-22 MED ORDER — AMLODIPINE BESYLATE 5 MG PO TABS
2.5000 mg | ORAL_TABLET | Freq: Every day | ORAL | Status: DC
  Administered 2017-12-23 – 2017-12-25 (×3): 2.5 mg via ORAL
  Filled 2017-12-22 (×3): qty 1

## 2017-12-22 MED ORDER — ADULT MULTIVITAMIN W/MINERALS CH
ORAL_TABLET | Freq: Every day | ORAL | Status: DC
  Administered 2017-12-23 – 2017-12-25 (×3): 1 via ORAL
  Filled 2017-12-22 (×3): qty 1

## 2017-12-22 MED ORDER — SODIUM CHLORIDE 0.9 % IV SOLN
1.0000 g | Freq: Once | INTRAVENOUS | Status: AC
  Administered 2017-12-22: 1 g via INTRAVENOUS
  Filled 2017-12-22: qty 10

## 2017-12-22 MED ORDER — LISINOPRIL 10 MG PO TABS
20.0000 mg | ORAL_TABLET | Freq: Every day | ORAL | Status: DC
  Administered 2017-12-23 – 2017-12-25 (×3): 20 mg via ORAL
  Filled 2017-12-22 (×2): qty 2
  Filled 2017-12-22: qty 4

## 2017-12-22 MED ORDER — SODIUM CHLORIDE 0.9% FLUSH
3.0000 mL | Freq: Two times a day (BID) | INTRAVENOUS | Status: DC
  Administered 2017-12-24 – 2017-12-25 (×3): 3 mL via INTRAVENOUS

## 2017-12-22 MED ORDER — JUVEN PO PACK
1.0000 | PACK | Freq: Two times a day (BID) | ORAL | Status: DC
  Administered 2017-12-23 (×2): 1 via ORAL
  Filled 2017-12-22 (×3): qty 1

## 2017-12-22 MED ORDER — APIXABAN 2.5 MG PO TABS
2.5000 mg | ORAL_TABLET | Freq: Two times a day (BID) | ORAL | Status: DC
  Administered 2017-12-23 – 2017-12-25 (×5): 2.5 mg via ORAL
  Filled 2017-12-22 (×9): qty 1

## 2017-12-22 MED ORDER — ACETAMINOPHEN 650 MG RE SUPP: 650.0000 mg | Freq: Four times a day (QID) | RECTAL | Status: DC | PRN

## 2017-12-22 MED ORDER — SODIUM CHLORIDE 0.9 % IV SOLN: 250.0000 mL | INTRAVENOUS | Status: DC | PRN

## 2017-12-22 MED ORDER — ONDANSETRON HCL 4 MG/2ML IJ SOLN
4.0000 mg | Freq: Four times a day (QID) | INTRAMUSCULAR | Status: DC | PRN
  Administered 2017-12-23: 4 mg via INTRAVENOUS
  Filled 2017-12-22 (×2): qty 2

## 2017-12-22 MED ORDER — VANCOMYCIN 50 MG/ML ORAL SOLUTION
125.0000 mg | Freq: Four times a day (QID) | ORAL | Status: DC
  Administered 2017-12-23 – 2017-12-25 (×10): 125 mg via ORAL
  Filled 2017-12-22 (×22): qty 2.5

## 2017-12-22 MED ORDER — OMEGA-3-ACID ETHYL ESTERS 1 G PO CAPS
1000.0000 mg | ORAL_CAPSULE | Freq: Every day | ORAL | Status: DC
  Administered 2017-12-23 – 2017-12-25 (×3): 1000 mg via ORAL
  Filled 2017-12-22 (×4): qty 1

## 2017-12-22 MED ORDER — PENTOXIFYLLINE ER 400 MG PO TBCR
400.0000 mg | EXTENDED_RELEASE_TABLET | Freq: Three times a day (TID) | ORAL | Status: DC
  Administered 2017-12-23 – 2017-12-25 (×8): 400 mg via ORAL
  Filled 2017-12-22 (×8): qty 1

## 2017-12-22 MED ORDER — ONDANSETRON HCL 4 MG PO TABS: 4.0000 mg | ORAL_TABLET | Freq: Four times a day (QID) | ORAL | Status: DC | PRN

## 2017-12-22 MED ORDER — SODIUM CHLORIDE 0.9 % IV BOLUS
500.0000 mL | Freq: Once | INTRAVENOUS | Status: AC
  Administered 2017-12-22: 500 mL via INTRAVENOUS

## 2017-12-22 MED ORDER — INSULIN ASPART 100 UNIT/ML ~~LOC~~ SOLN: 0.0000 [IU] | Freq: Every day | SUBCUTANEOUS | Status: DC

## 2017-12-22 MED ORDER — OXYCODONE HCL 5 MG PO TABS
5.0000 mg | ORAL_TABLET | Freq: Four times a day (QID) | ORAL | Status: DC | PRN
  Administered 2017-12-23 – 2017-12-24 (×2): 5 mg via ORAL
  Filled 2017-12-22 (×2): qty 1

## 2017-12-22 MED ORDER — FINASTERIDE 5 MG PO TABS
5.0000 mg | ORAL_TABLET | Freq: Every day | ORAL | Status: DC
  Administered 2017-12-23 – 2017-12-25 (×3): 5 mg via ORAL
  Filled 2017-12-22 (×5): qty 1

## 2017-12-22 MED ORDER — ACETAMINOPHEN 325 MG PO TABS
650.0000 mg | ORAL_TABLET | Freq: Four times a day (QID) | ORAL | Status: DC | PRN
  Administered 2017-12-24: 650 mg via ORAL
  Filled 2017-12-22: qty 2

## 2017-12-22 MED ORDER — INSULIN ASPART 100 UNIT/ML ~~LOC~~ SOLN
0.0000 [IU] | Freq: Three times a day (TID) | SUBCUTANEOUS | Status: DC
  Administered 2017-12-23 (×3): 1 [IU] via SUBCUTANEOUS
  Administered 2017-12-24: 2 [IU] via SUBCUTANEOUS
  Administered 2017-12-24: 1 [IU] via SUBCUTANEOUS

## 2017-12-22 MED ORDER — ROSUVASTATIN CALCIUM 20 MG PO TABS
40.0000 mg | ORAL_TABLET | Freq: Every day | ORAL | Status: DC
  Administered 2017-12-23 – 2017-12-24 (×2): 40 mg via ORAL
  Filled 2017-12-22: qty 2
  Filled 2017-12-22: qty 1
  Filled 2017-12-22: qty 2

## 2017-12-22 MED ORDER — VENELEX EX OINT: 1.0000 "application " | TOPICAL_OINTMENT | Freq: Three times a day (TID) | CUTANEOUS | Status: DC

## 2017-12-22 MED ORDER — METOPROLOL TARTRATE 25 MG PO TABS
12.5000 mg | ORAL_TABLET | Freq: Two times a day (BID) | ORAL | Status: DC
  Administered 2017-12-23 – 2017-12-25 (×5): 12.5 mg via ORAL
  Filled 2017-12-22 (×5): qty 1

## 2017-12-22 MED ORDER — CALCIUM CARBONATE ANTACID 500 MG PO CHEW: 1.0000 | CHEWABLE_TABLET | Freq: Every day | ORAL | Status: DC | PRN

## 2017-12-22 NOTE — H&P (Signed)
History and Physical    Eric [name]. GUY:[id number] DOB: 1928-07-22 DOA: 12/22/2017  PCP: [name], MD   Patient coming from: SNF   Chief Complaint: Fever, lethargy   HPI: Eric Gladu. is a 84 y.o. male with medical history significant for coronary artery disease, atrial fibrillation on Eliquis, chronic kidney disease stage II, diet-controlled diabetes mellitus, gangrene of the left foot status post left BKA on 12/15/2017, and recent C. difficile colitis, now presenting from his SNF for evaluation of fever and lethargy.  Patient was discharged from the hospital to SNF yesterday after management of C. difficile colitis.  His diarrhea had essentially resolved and he had been doing much better until today when he was noted to have fever and lethargy.  He denies abdominal pain, but reports nausea without vomiting.  He has a general lethargy, but denies focal numbness or weakness.  He continues oral vancomycin for C. difficile.  Was evaluated by his vascular surgeon 2 days ago and the BKA stump was healing well at that time.  ED Course: Upon arrival to the ED, patient is found to be afebrile, saturating well on room air, and with vitals otherwise normal.  Chest x-ray is negative for acute cardiopulmonary disease, notable for mild cardiomegaly.  CT of the abdomen and pelvis reveals thick-walled irregular appearance to the bladder, but otherwise negative for acute intra-abdominal or pelvic process.  Chemistry panel is notable for a hyponatremia to 127 and creatinine 1.08, similar to priors.  CBC is notable for leukocytosis to 30,700 and a normocytic anemia with hemoglobin of 9.4.  Lactic acid is reassuring at 1.49.  Patient was given Rocephin in the ED, remains hemodynamically stable, and will be admitted for ongoing evaluation and management of acute UTI.  Review of Systems:  All other systems reviewed and apart from HPI, are negative.  Past Medical History:  Diagnosis Date  .  Atrial flutter ([place])    s/p RFCA  . CAD (coronary artery disease)    cath 2003, occluded S-RCA, occluded S-Dx, L-LAD ok, s/p PTCA to LAD  . Cataract   . CKD (chronic kidney disease) stage 3, GFR 30-59 ml/min (HCC)   . Diabetes mellitus    diet controlled  . Hearing aid worn    bilateral  . History of kidney stones   . Long term (current) use of anticoagulants   . Neuromuscular disorder ([place])   . OSA (obstructive sleep apnea) 12/11   very mild, AHI 7/hr  . Persistent atrial fibrillation ([place]) 09/26/2015  . Pure hyperglyceridemia   . PVD (peripheral vascular disease) ([place])    angioplasty of his right lower extremity in [place] by Dr.[name] 2013  . Unspecified essential hypertension   . Wears dentures    full upper and lower    Past Surgical History:  Procedure Laterality Date  . ABDOMINAL AORTOGRAM W/LOWER EXTREMITY N/A 11/15/2017   Procedure: ABDOMINAL AORTOGRAM W/LOWER EXTREMITY;  Surgeon: [name], MD;  Location: [place] [facility];  Service: Cardiovascular;  Laterality: N/A;  . Adenosine Myoview  3/06   EF 56%, neg. Ischemia  . Adenosine Myoview  02/18/07   nml  . AMPUTATION Left 12/15/2017   Procedure: AMPUTATION BELOW KNEE;  Surgeon: Algernon [name], MD;  Location: [facility];  Service: Vascular;  Laterality: Left;  . ANGIOPLASTY  1/99   CAD- diogonal with rotational artherectomy  . Arthrectomy     of LAD & PTCA  . BLEPHAROPLASTY Bilateral   . CARDIAC  CATHETERIZATION  1/00  . CARDIOVERSION  1/04  . CARDIOVERSION  5/07   hospital- a flutter  . CARPAL TUNNEL RELEASE     ? bilateral  . CATARACT EXTRACTION W/PHACO Right 07/28/2017   Procedure: CATARACT EXTRACTION PHACO AND INTRAOCULAR LENS PLACEMENT ([place]) RIGHT DIABETIC;  Surgeon: [name], MD;  Location: [place];  Service: Ophthalmology;  Laterality: Right;  Diabetic - diet controlled  . CATARACT EXTRACTION W/PHACO Left 08/18/2017   Procedure: CATARACT EXTRACTION PHACO AND INTRAOCULAR LENS  PLACEMENT (I[facility]);  Surgeon: [name], MD;  Location: [place];  Service: Ophthalmology;  Laterality: Left;  DIABETES - oral meds  . COLONOSCOPY W/ BIOPSIES  10/01/06   sigmoid polyp bx neg, 3 years  . CORONARY ANGIOPLASTY  4/03   cutting balloon PTCA pLAD into Diag  . CORONARY ARTERY BYPASS GRAFT  2000   LIMA-LAD, SVG-RCA, SVG-Diag; SVG-Diag & SVG-RCA occluded 2003  . FRACTURE SURGERY    . HAND SURGERY  08/27/09   R thumb procedure wit Scaphoid Gragt and screws, Dr [name]  . NM MYOVIEW LTD  4/11   normal  . PERIPHERAL VASCULAR CATHETERIZATION Right 06/27/2015   Procedure: Lower Extremity Angiography;  Surgeon: Algernon [name], MD;  Location: [place] [facility];  Service: Cardiovascular;  Laterality: Right;  . PERIPHERAL VASCULAR CATHETERIZATION  06/27/2015   Procedure: Lower Extremity Intervention;  Surgeon: Algernon [name], MD;  Location: [place] [facility];  Service: Cardiovascular;;     reports that he has never smoked. He quit smokeless tobacco use about 25 years ago. He reports that he does not drink alcohol or use drugs.  Allergies  Allergen Reactions  . Isosorbide Mononitrate Other (See Comments)    Unknown, doesn't remember   . Ramipril Cough  . Quinidine Gluconate Rash    Family History  Problem Relation Age of Onset  . Stroke Father   . Aneurysm Father   . Hypertension Father   . Prostate cancer Brother   . Hypertension Mother   . Lung cancer Brother        smoker  . Thrombosis Brother   . Other Brother        RF valve disorder (smoker)  . Lung cancer Brother        smoker  . Breast cancer Sister   . Liver cancer Brother   . Other Sister        cerebral hemorrhage     Prior to Admission medications   Medication Sig Start Date End Date Taking? Authorizing Provider  acetaminophen (TYLENOL) 325 MG tablet Take 2 tablets (650 mg total) by mouth every 6 (six) hours as needed for mild pain (or temp >/= 101 F). 12/21/17  Yes Wieting, Richard, MD    amLODipine (NORVASC) 2.5 MG tablet Take 1 tablet (2.5 mg total) by mouth daily. 12/21/17  Yes Wieting, Richard, MD  apixaban (ELIQUIS) 2.5 MG TABS tablet Take 1 tablet (2.5 mg total) by mouth 2 (two) times daily. 11/05/17  Yes [name], MD  B Complex Vitamins (B COMPLEX-B12) TABS Take 1 tablet by mouth daily.    Yes [provider]  Janne Lab Oil [facility]) OINT Apply 1 application topically 3 (three) times daily. Applied at every shift day, evening, and night to bilateral buttocks/sacrum area   Yes [provider]  calcium carbonate (TUMS - DOSED IN MG ELEMENTAL CALCIUM) 500 MG chewable tablet Chew 1 tablet by mouth daily as needed for indigestion or heartburn.   Yes [provider]  cholecalciferol (VITAMIN D) 1000 UNITS tablet Take 1,000 Units by mouth 2 (two) times a week. Takes on Mon and Fri   Yes [provider]  Cinnamon 500 MG capsule Take 1,000 mg by mouth 2 (two) times daily.    Yes [provider]  fenofibrate 160 MG tablet Take 1 tablet (160 mg total) by mouth daily. 04/23/17  Yes [name], MD  finasteride (PROSCAR) 5 MG tablet Take 1 tablet (5 mg total) by mouth daily. 12/21/17  Yes Wieting, Richard, MD  fish oil-omega-3 fatty acids 1000 MG capsule Take 1,000 mg by mouth daily.    Yes [provider]  lisinopril (PRINIVIL,ZESTRIL) 20 MG tablet Take 1 tablet (20 mg total) by mouth daily. 12/21/17  Yes Wieting, Richard, MD  metoprolol tartrate (LOPRESSOR) 25 MG tablet Take 0.5 tablets (12.5 mg total) by mouth 2 (two) times daily. 12/21/17  Yes Wieting, Richard, MD  Multiple Vitamin (DAILY MULTIVITAMIN PO) Take 1 tablet by mouth daily.     Yes [provider]  nitroGLYCERIN (NITRODUR - DOSED IN MG/24 HR) 0.2 mg/hr patch Place 1 patch (0.2 mg total) onto the skin daily. 11/24/17  Yes Newt Minion, MD  ondansetron (ZOFRAN) 4 MG tablet Take 4 mg by mouth every 6 (six) hours as needed for nausea or vomiting.    Yes [provider]  oxyCODONE (OXY IR/ROXICODONE) 5 MG immediate release tablet One tab every six hours as needed for pain Patient taking differently: Take 5 mg by mouth every 6 (six) hours as needed for moderate pain.  12/21/17  Yes Wieting, Richard, MD  pentoxifylline (TRENTAL) 400 MG CR tablet Take 1 tablet (400 mg total) by mouth 3 (three) times daily with meals. 11/24/17  Yes Newt Minion, MD  rosuvastatin (CRESTOR) 40 MG tablet Take 1 tablet (40 mg total) by mouth daily. 08/20/17  Yes [name], MD  tamsulosin (FLOMAX) 0.4 MG CAPS capsule Take 1 capsule (0.4 mg total) by mouth daily. 12/21/17  Yes Wieting, Richard, MD  vancomycin (VANCOCIN) 50 mg/mL oral solution Take 2.5 mLs (125 mg total) by mouth every 6 (six) hours for 11 days. 12/21/17 01/01/18 Yes Wieting, Richard, MD  Blood Glucose Monitoring Suppl (ONE TOUCH ULTRA SYSTEM KIT) W/DEVICE KIT 1 kit by Does not apply route once. 11/01/14   [name], MD  glucose blood test strip Use as instructed 11/25/17   [name], MD  nutrition supplement, JUVEN, Fanny Dance) PACK Take 1 packet by mouth 2 (two) times daily between meals. 11/16/17   Cristal Ford, DO  [facility] DELICA LANCETS FINE MISC check sugar once daily 08/17/17   [name], MD    Physical Exam: Vitals:   12/22/17 2000 12/22/17 2125  BP: 118/60 133/75  Pulse: 85 81  Resp: 15 20  Temp: 98.9 F (37.2 C)   TempSrc: Oral   SpO2: 98% 100%  Weight: 72.6 kg (160 lb)   Height: '5\' 5"'  (1.651 m)       Constitutional: NAD, calm, lethargic Eyes: PERTLA, lids and conjunctivae normal ENMT: Mucous membranes are moist. Posterior pharynx clear of any exudate or lesions.   Neck: normal, supple, no masses, no thyromegaly Respiratory: clear to auscultation bilaterally, no wheezing, no crackles. Normal respiratory effort.    Cardiovascular: Rate ~80 and irregular. Mild pretibial edema. No significant JVD. Abdomen: No distension, no tenderness, soft. Bowel  sounds normal.  Musculoskeletal: no clubbing / cyanosis. Status-post left BKA.  Skin: no significant rashes,  lesions, ulcers. Warm, dry, well-perfused. Neurologic: CN 2-12 grossly intact. Sensation intact. Strength 5/5 in all 4 limbs.  Psychiatric: lethargic, easily roused and oriented x 3. Normal mood and affect.     Labs on Admission: I have personally reviewed following labs and imaging studies  CBC: Recent Labs  Lab 12/16/17 0420 12/17/17 0340 12/18/17 0630 12/22/17 1750 12/22/17 2035  WBC 9.3 7.4 8.8 29.3* 30.7*  NEUTROABS  --   --   --  24.4 24.9*  HGB 10.3* 10.0* 10.0* 9.2* 9.4*  HCT 30.2* 29.4* 29.3* 27.9* 28.3*  MCV 93.4 92.1 92.1 91.2 92.2  PLT 230 236 229 304 097   Basic Metabolic Panel: Recent Labs  Lab 12/17/17 0340 12/18/17 0630 12/19/17 0606 12/22/17 1750 12/22/17 2035  NA 133* 132* 135 126* 127*  K 4.1 3.7 3.9 4.2 4.1  CL 105 103 106 96* 95*  CO2 '22 25 26 23 24  ' GLUCOSE 106* 118* 109* 263* 177*  BUN 33* 22* 21* 24* 24*  CREATININE 1.18 0.99 1.03 1.00 1.08  CALCIUM 8.0* 8.2* 8.3* 8.4* 8.5*  MG  --  1.3* 2.0  --   --    GFR: Estimated Creatinine Clearance: 41.9 mL/min (by C-G formula based on SCr of 1.08 mg/dL). Liver Function Tests: Recent Labs  Lab 12/22/17 1750 12/22/17 2035  AST 54* 53*  ALT 31 32  ALKPHOS 71 76  BILITOT 0.6 0.9  PROT 5.6* 6.0*  ALBUMIN 2.3* 2.5*   No results for input(s): LIPASE, AMYLASE in the last 168 hours. No results for input(s): AMMONIA in the last 168 hours. Coagulation Profile: No results for input(s): INR, PROTIME in the last 168 hours. Cardiac Enzymes: No results for input(s): CKTOTAL, CKMB, CKMBINDEX, TROPONINI in the last 168 hours. BNP (last 3 results) No results for input(s): PROBNP in the last 8760 hours. HbA1C: No results for input(s): HGBA1C in the last 72 hours. CBG: Recent Labs  Lab 12/17/17 0640 12/18/17 0749  GLUCAP 81 100*   Lipid Profile: No results for input(s): CHOL, HDL, LDLCALC,  TRIG, CHOLHDL, LDLDIRECT in the last 72 hours. Thyroid Function Tests: No results for input(s): TSH, T4TOTAL, FREET4, T3FREE, THYROIDAB in the last 72 hours. Anemia Panel: No results for input(s): VITAMINB12, FOLATE, FERRITIN, TIBC, IRON, RETICCTPCT in the last 72 hours. Urine analysis:    Component Value Date/Time   COLORURINE YELLOW 12/22/2017 1750   APPEARANCEUR CLOUDY (A) 12/22/2017 1750   LABSPEC 1.015 12/22/2017 1750   PHURINE 5.0 12/22/2017 1750   GLUCOSEU >=500 (A) 12/22/2017 1750   HGBUR LARGE (A) 12/22/2017 1750   HGBUR trace-lysed 02/07/2008 1101   BILIRUBINUR NEGATIVE 12/22/2017 1750   KETONESUR NEGATIVE 12/22/2017 1750   PROTEINUR 100 (A) 12/22/2017 1750   UROBILINOGEN 0.2 02/07/2008 1101   NITRITE NEGATIVE 12/22/2017 1750   LEUKOCYTESUR LARGE (A) 12/22/2017 1750   Sepsis Labs: '@LABRCNTIP' (procalcitonin:4,lacticidven:4) )No results found for this or any previous visit (from the past 240 hour(s)).   Radiological Exams on Admission: Ct Abdomen Pelvis Wo Contrast  Result Date: 12/22/2017 CLINICAL DATA:  Fever and elevated white count EXAM: CT ABDOMEN AND PELVIS WITHOUT CONTRAST TECHNIQUE: Multidetector CT imaging of the abdomen and pelvis was performed following the standard protocol without IV contrast. COMPARISON:  04/21/2016 FINDINGS: Lower chest: Lung bases demonstrate no acute consolidation or pleural effusion. Lower sternotomy changes. Cardiomegaly with coronary vascular calcification. Hepatobiliary: Cyst in the left hepatic lobe. Additional subcentimeter hypodensities in the right lobe too small to further characterize. Calcified stone in the gallbladder. No  biliary dilatation. Pancreas: Unremarkable. No pancreatic ductal dilatation or surrounding inflammatory changes. Spleen: Normal in size without focal abnormality. Adrenals/Urinary Tract: Adrenal glands are within normal limits. No hydronephrosis. Air in the bladder. Foley catheter in the bladder. Thick-walled  appearance of the bladder with irregular margin. Stomach/Bowel: Stomach is within normal limits. Appendix appears normal. No evidence of bowel wall thickening, distention, or inflammatory changes. Sigmoid and descending colon diverticular disease without acute inflammation Vascular/Lymphatic: Moderate aortic atherosclerosis. No aneurysmal dilatation. No significantly enlarged lymph nodes Reproductive: Enlarged prostate gland Other: Negative for free air or free fluid. Musculoskeletal: Degenerative changes. No acute or suspicious abnormality. IMPRESSION: 1. Thick wall irregular appearance of the bladder, which may be secondary to cystitis or chronic obstruction. Bladder is decompressed by Foley catheter. 2. Otherwise no CT evidence for acute intra-abdominal or pelvic abnormality. 3. Gallstone 4. Diverticular disease of the colon without acute inflammation 5. Cardiomegaly Electronically Signed   By: Donavan Foil M.D.   On: 12/22/2017 22:12   Dg Chest 2 View  Result Date: 12/22/2017 CLINICAL DATA:  Fever with weakness and nausea EXAM: CHEST - 2 VIEW COMPARISON:  08/26/2010 FINDINGS: Post sternotomy changes. Mild hyperinflation. No focal airspace disease or pleural effusion. Mild cardiomegaly. No pneumothorax. Mild degenerative changes of the spine. IMPRESSION: No active cardiopulmonary disease.  Mild cardiomegaly. Electronically Signed   By: Donavan Foil M.D.   On: 12/22/2017 20:05    EKG: Not performed.   Assessment/Plan  1. Acute UTI  - Presents with fever and lethargy, found to have UA suggestive of UTI  - Urine being cultured at SNF, empiric Rocephin administered in ED  - Continue empiric abx with cefepime while following culture and clinical course   2. C diff colitis  - Diarrhea has resolved  - CT abd/pelvis without acute inflammation or complication  - Continue oral vancomycin to complete course   3. Hyponatremia  - Serum sodium is 127 on admission  - Appears euvolemic; mild  hyperglycemia noted  - HCTZ discontinued last week  - Given 500 cc NS in ED  - Repeat chem panel in am    4. Normocytic anemia  - Hgb is 9.4 on admission, slightly lower than recent priors  - No active bleeding, likely d/t chronic disease and recent amputation  - Continue b12 supplementation, repeat CBC in am    5. Atrial fibrillation  - In rate-controlled a fib on admission  - CHADS-VASc is 33 (age x2, hx DM, HTN, CAD)  - Continue Eliquis and metoprolol    6. CKD stage II  - SCr is 1.08 on admission, improved from recent AKI  - Renally-dose medications, avoid nephrotoxins   7. POD #7 left BKA  - Underwent left DKA on 12/15/17 for tx of left foot gangrene  - Appears stable, saw surgeon 12/20/17  - Continue PT    8. Type II DM  - Diet-controlled  - Check CBG's and use low-intensity SSI with Novolog as needed while in hospital    DVT prophylaxis: Eliquis  Code Status: Full  Family Communication: Daughter and wife updated at bedside Consults called: none Admission status: Inpatient    Eric Bulls, MD [facility] Pager [phone]  If 7PM-7AM, please contact night-coverage [email or web address] Password [facility]  12/22/2017, 10:51 PM

## 2017-12-22 NOTE — Progress Notes (Signed)
Location:   [facility] Room Number: 133/P Place of Service:  SNF (31) Provider:  Elige Radon, MD  Patient Care Team: [name], MD as PCP - [facility])  Extended Emergency Contact Information Primary Emergency Contact: Wiley,Lillie Address: [address]          [place], [place] [address] [name] of [place] Phone: [phone] Mobile Phone: [phone] Relation: Spouse Secondary Emergency Contact: [place], [place], [place] [address] [name] of [place] Mobile Phone: [phone] Relation: Son  Code Status:  Full Code Goals of care: Advanced Directive information Advanced Directives 12/22/2017  Does Patient Have a Medical Advance Directive? Yes  Type of Advance Directive (No Data)  Does patient want to make changes to medical advance directive? No - Patient declined  Copy of [place] in Chart? -  Would patient like information on creating a medical advance directive? No - Patient declined     Chief Complaint  Patient presents with  . Hospitalization Follow-up    Patients being seen for Hospitalization F/U  With history of C. difficile- also history of gangrene in the left lower extremity status post left below the knee amputation  HPI:  Pt is a 84 y.o. male seen today for hospitalization follow-up.  Patient does not have a history of gangrene left lower extremity with vascular insufficiency.  It was decided by his vascular doctor as well as orthopedist [name] that amputation would be necessary since wound apparently did not heal because of the insufficiency.  It appears this was performed on May 8.  He also developed C. difficile colitis it appears and has been discharged on oral vancomycin diarrhea apparently was improving recommendation was to continue for an additional 11 days status post discharge.  He also had low magnesium and potassium which  was supplemented his hydrochlorothiazide was discontinued.  Regards to amputation this was secondary to gangrene of the left lower extremity-follow-up with orthopedics will be needed.  He also had bradycardia with atrial fibrillation his Lopressor was decreased he is on Eliquis for anticoagulation.  Regards to hypertension his dosages of Norvasc lisinopril metoprolol apparently were reduced in the hospital because of hypotension concerns.  He also has urinary retention and was started on Flomax and Proscar and needed replacement of his Foley catheter he will need urology follow-up.  Currently he is complaining of some nausea and we have prescribed Zofran says he just does not feel good-apparently he had some of this in the hospital as well thought to be antibiotic related.  He has developed a low-grade fever of high 99's and up to 100.1.  He is not complaining of any shortness of breath increased cough or chest pain.           Past Medical History:  Diagnosis Date  . Atrial flutter ([place])    s/p RFCA  . CAD (coronary artery disease)    cath 2003, occluded S-RCA, occluded S-Dx, L-LAD ok, s/p PTCA to LAD  . Cataract   . CKD (chronic kidney disease) stage 3, GFR 30-59 ml/min (HCC)   . Diabetes mellitus    diet controlled  . Hearing aid worn    bilateral  . History of kidney stones   . Long term (current) use of anticoagulants   . Neuromuscular disorder ([place])   . OSA (obstructive sleep apnea) 12/11   very mild, AHI 7/hr  .  Persistent atrial fibrillation ([place]) 09/26/2015  . Pure hyperglyceridemia   . PVD (peripheral vascular disease) ([place])    angioplasty of his right lower extremity in [place] by Dr.[name] 2013  . Unspecified essential hypertension   . Wears dentures    full upper and lower   Past Surgical History:  Procedure Laterality Date  . ABDOMINAL AORTOGRAM W/LOWER EXTREMITY N/A 11/15/2017   Procedure: ABDOMINAL AORTOGRAM W/LOWER EXTREMITY;  Surgeon: [name], MD;  Location: [facility];  Service: Cardiovascular;  Laterality: N/A;  . Adenosine Myoview  3/06   EF 56%, neg. Ischemia  . Adenosine Myoview  02/18/07   nml  . AMPUTATION Left 12/15/2017   Procedure: AMPUTATION BELOW KNEE;  Surgeon: Algernon [name], MD;  Location: [facility];  Service: Vascular;  Laterality: Left;  . ANGIOPLASTY  1/99   CAD- diogonal with rotational artherectomy  . Arthrectomy     of LAD & PTCA  . BLEPHAROPLASTY Bilateral   . CARDIAC CATHETERIZATION  1/00  . CARDIOVERSION  1/04  . CARDIOVERSION  5/07   hospital- a flutter  . CARPAL TUNNEL RELEASE     ? bilateral  . CATARACT EXTRACTION W/PHACO Right 07/28/2017   Procedure: CATARACT EXTRACTION PHACO AND INTRAOCULAR LENS PLACEMENT ([place]) RIGHT DIABETIC;  Surgeon: [name], MD;  Location: [facility];  Service: Ophthalmology;  Laterality: Right;  Diabetic - diet controlled  . CATARACT EXTRACTION W/PHACO Left 08/18/2017   Procedure: CATARACT EXTRACTION PHACO AND INTRAOCULAR LENS PLACEMENT (IOC);  Surgeon: [name], MD;  Location: [facility];  Service: Ophthalmology;  Laterality: Left;  DIABETES - oral meds  . COLONOSCOPY W/ BIOPSIES  10/01/06   sigmoid polyp bx neg, 3 years  . CORONARY ANGIOPLASTY  4/03   cutting balloon PTCA pLAD into Diag  . CORONARY ARTERY BYPASS GRAFT  2000   LIMA-LAD, SVG-RCA, SVG-Diag; SVG-Diag & SVG-RCA occluded 2003  . FRACTURE SURGERY    . HAND SURGERY  08/27/09   R thumb procedure wit Scaphoid Gragt and screws, Dr [name]  . NM MYOVIEW LTD  4/11   normal  . PERIPHERAL VASCULAR CATHETERIZATION Right 06/27/2015   Procedure: Lower Extremity Angiography;  Surgeon: Algernon [name], MD;  Location: [facility];  Service: Cardiovascular;  Laterality: Right;  . PERIPHERAL VASCULAR CATHETERIZATION  06/27/2015   Procedure: Lower Extremity Intervention;  Surgeon: Algernon [name], MD;  Location: [facility];  Service: Cardiovascular;;    Allergies    Allergen Reactions  . Isosorbide Mononitrate Other (See Comments)    Unknown, doesn't remember   . Ramipril Cough  . Quinidine Gluconate Rash    Outpatient Encounter Medications as of 12/22/2017  Medication Sig  . acetaminophen (TYLENOL) 325 MG tablet Take 2 tablets (650 mg total) by mouth every 6 (six) hours as needed for mild pain (or temp >/= 101 F).  [name] amLODipine (NORVASC) 2.5 MG tablet Take 1 tablet (2.5 mg total) by mouth daily.  [name] apixaban (ELIQUIS) 2.5 MG TABS tablet Take 1 tablet (2.5 mg total) by mouth 2 (two) times daily.  . B Complex Vitamins (B COMPLEX-B12) TABS Take 1 tablet by mouth daily.   . Blood Glucose Monitoring Suppl (ONE TOUCH ULTRA SYSTEM KIT) W/DEVICE KIT 1 kit by Does not apply route once.  . calcium carbonate (TUMS - DOSED IN MG ELEMENTAL CALCIUM) 500 MG chewable tablet Chew 1 tablet by mouth daily as needed for indigestion or heartburn.  . cholecalciferol (VITAMIN D) 1000 UNITS tablet Take  1,000 Units by mouth 2 (two) times a week. Takes on Mon and Fri  . CINNAMON PO Take 1,000 mg by mouth 2 (two) times daily.   . fenofibrate 160 MG tablet Take 1 tablet (160 mg total) by mouth daily.  . finasteride (PROSCAR) 5 MG tablet Take 1 tablet (5 mg total) by mouth daily.  . fish oil-omega-3 fatty acids 1000 MG capsule Take 1,000 mg by mouth daily.   [name] glucose blood test strip Use as instructed  . lisinopril (PRINIVIL,ZESTRIL) 20 MG tablet Take 1 tablet (20 mg total) by mouth daily.  . metoprolol tartrate (LOPRESSOR) 25 MG tablet Take 0.5 tablets (12.5 mg total) by mouth 2 (two) times daily.  . Multiple Vitamin (DAILY MULTIVITAMIN PO) Take 1 tablet by mouth daily.    . nitroGLYCERIN (NITRODUR - DOSED IN MG/24 HR) 0.2 mg/hr patch Place 1 patch (0.2 mg total) onto the skin daily.  . nutrition supplement, JUVEN, (JUVEN) PACK Take 1 packet by mouth 2 (two) times daily between meals.  [name] DELICA LANCETS FINE MISC check sugar once daily  . oxyCODONE (OXY IR/ROXICODONE)  5 MG immediate release tablet One tab every six hours as needed for pain  . pentoxifylline (TRENTAL) 400 MG CR tablet Take 1 tablet (400 mg total) by mouth 3 (three) times daily with meals.  . rosuvastatin (CRESTOR) 40 MG tablet Take 1 tablet (40 mg total) by mouth daily.  . tamsulosin (FLOMAX) 0.4 MG CAPS capsule Take 1 capsule (0.4 mg total) by mouth daily.  . vancomycin (VANCOCIN) 50 mg/mL oral solution Take 2.5 mLs (125 mg total) by mouth every 6 (six) hours for 11 days.   No facility-administered encounter medications on file as of 12/22/2017.      Review of Systems   In general is not complaining of any fever or chills.  Skin does not complain of rashes or itching or diaphoresis surgical site left stump site staples in place without drainage there is some tenderness which appears to be somewhat chronic since operation.  He also has a small hardened lesion lateral right foot.   Eyes nose mouth and throat does not complain of visual changes or sore throat.  Respiratory does not complain of increased shortness of breath or cough.  Cardiac denies chest pain does not have really much significant lower extremity edema.  GI does not complain of acute abdominal discomfort says it feels more like a sour stomach- is being treated for C. difficile does have nausea apparently with a small amount of vomiting earlier.  GU does have an indwelling Foley catheter is not really complaining of dysuria.  Musculoskeletal has weakness but is not complaining of joint pain.  Neurologic does not complain of dizziness headache or syncope.  Psych is not complaining of depression or anxiety but does appear to be feeling somewhat down because of the nausea.  .    Immunization History  Administered Date(s) Administered  . Influenza Split 07/14/2011, 06/02/2012  . Influenza Whole 05/24/2005, 05/20/2007, 05/21/2008, 07/19/2009, 05/28/2010  . Influenza, High Dose Seasonal PF 05/03/2017  .  Influenza,inj,Quad PF,6+ Mos 04/11/2013, 04/20/2014, 05/06/2015, 06/02/2016  . Influenza-Unspecified 05/03/2017  . Pneumococcal Conjugate-13 05/06/2015  . Pneumococcal Polysaccharide-23 10/17/2013  . Td 03/13/2004   Pertinent  Health Maintenance Due  Topic Date Due  . FOOT EXAM  03/01/2018  . HEMOGLOBIN A1C  03/07/2018  . INFLUENZA VACCINE  03/10/2018  . OPHTHALMOLOGY EXAM  05/03/2018  . PNA vac Low Risk Adult  Completed   Fall  Risk  07/07/2017 06/30/2017 05/03/2017 05/03/2017 12/24/2015  Falls in the past year? No No No Yes No  Number falls in past yr: - - - 1 -  Injury with Fall? - - - No -  Risk for fall due to : - - - - -  Risk for fall due to: Comment - - - - -  Follow up - - Falls evaluation completed;Falls prevention discussed Falls evaluation completed -   Functional Status Survey:    Vitals:   12/22/17 1107  BP: (!) 149/67  Pulse: 95  Resp: 20  Temp: 99.3 F (37.4 C)  TempSrc: Oral  Recent Of note manual blood pressure this afternoon was 140/60-  Temperatures have ranged from 99 up to 100.1  Physical Exam   In general this is a pleasant elderly male in no acute distress -- He appears well-nourished.   His skin is warm and dry amputation site left stump site staples are in place I do not really see concerning drainage or erythema there is some tenderness to the area.  He also has a small hardened--hyperpigmented area lateral right foot without sign of infection.  Eyes sclera and conjunctive are clear visual acuity appears grossly intact.  Oropharynx clear mucous membranes moist.  Chest is clear to auscultation with shallow air entry I could not really appreciate any labored breathing.  Heart is irregular irregular rate and rhythm without murmur gallop or rub he has scant lower extremity edema pedal pulse on the right is difficult to palpate.  Abdomen is somewhat obese soft is not acutely tender to palpation-bowel sounds are active.    GU he does have an  indwelling Foley catheter draining amber-colored urine    Musculoskeletal Limited exam since he is in bed he is status post left below the knee amputation with surgical site as noted above he does have a protective boot over his right foot-.  Able to move his upper extremities at baseline it appears he turns in bed without difficulty.  Neurologic as noted above I could not really appreciate lateralizing findings cranial nerves appear to be intact his speech is clear.  Psych he is alert and oriented pleasant and appropriate   Labs reviewed: Recent Labs    12/17/17 0340 12/18/17 0630 12/19/17 0606  NA 133* 132* 135  K 4.1 3.7 3.9  CL 105 103 106  CO2 '22 25 26  ' GLUCOSE 106* 118* 109*  BUN 33* 22* 21*  CREATININE 1.18 0.99 1.03  CALCIUM 8.0* 8.2* 8.3*  MG  --  1.3* 2.0   Recent Labs    03/01/17 0828 08/16/17 0802 11/10/17 1036 12/10/17 1038  AST 35 31 38 29  ALT '20 17 19 16  ' ALKPHOS 28*  --  38  --   BILITOT 0.7 0.6 0.8 0.6  PROT 7.1 7.0 6.9 6.6  ALBUMIN 4.2  --  3.3*  --    Recent Labs    03/01/17 0828 11/10/17 1036  12/10/17 1038  12/16/17 0420 12/17/17 0340 12/18/17 0630  WBC 9.1 11.2*   < > 12.4*   < > 9.3 7.4 8.8  NEUTROABS 5,096 7.3  --  7,378  --   --   --   --   HGB 11.9* 11.2*   < > 11.8*   < > 10.3* 10.0* 10.0*  HCT 37.7* 34.2*   < > 34.8*   < > 30.2* 29.4* 29.3*  MCV 97.4 93.2   < > 91.1   < > 93.4  92.1 92.1  PLT 196 221   < > 264   < > 230 236 229   < > = values in this interval not displayed.   Lab Results  Component Value Date   TSH 3.428 04/30/2015   Lab Results  Component Value Date   HGBA1C 6.0 (H) 09/07/2017   Lab Results  Component Value Date   CHOL 103 09/07/2017   HDL 21 (L) 09/07/2017   LDLCALC 61 09/07/2017   LDLDIRECT 99.1 02/19/2011   TRIG 128 09/07/2017   CHOLHDL 4.9 09/07/2017    Significant Diagnostic Results in last 30 days:  No results found.  Assessment/Plan  #1- low-grade fever- he is being treated for C.  difficile with oral vancomycin for appears an additional 10 days- will order a chest x-ray as well as a UA CNS-also will order a stat CBC with differential and metabolic panel-monitor vital signs every 4 hours x 4 and then every shift.  Also will order blood cultures for fever greater than 101    He does not appear overtly septic but does not appear to be feeling well and I suspect the nausea is contributing to this as well.  2.  --#2- history of hypo-kalemia and low magnesium and this was replenished and hydrochlorothiazide was discontinued again will update labs.  3.  C. difficile he is on oral vancomycin for 11 days status post discharge apparently diarrhea has improved somewhat he is having some nausea as noted above.  4.  Atrial fibrillation metoprolol was decreased in hospital because of bradycardia concerned she is on Eliquis for anticoagulation at this point appears to be rate controlled.  5.  Hypertension he is on lisinopril as well as low-dose Norvasc and Lopressor- systolic is mildly elevated today at this point will monitor would be hesitant to be too aggressive here without more readings.  6.  History of acute kidney injury apparently this improved with IV fluids in the hospital creatinine was 1.03 BUN 21 on lab done on May 12 again this is being updated.  7.  History of nausea again we have started PRN Zofran this will have to be monitored as well.  8.  History of urinary retention he does have a Foley catheter he has been started on Flomax and Proscar urology follow-up will be scheduled.  10.  History of left below the knee amputation secondary to gangrene and nonhealing wound- he will have orthopedic follow-up- he does have oxycodone as needed for pain have to be monitored whether this may be contributing to his nausea as well.  [id number] of note greater than 50 minutes spent assessing patient reviewing his chart and labs discussing status with nursing staff as well as with  his wife at bedside- coordinating and formulating a plan of care for numerous diagnoses- of note greater than 50% of time spent coordinating plan of care   --Addendum we have received the updated lab work which is concerning for a significantly elevated white count of 29,300.  Nursing staff has contacted the patient who is actually in radiology getting a chest x-ray-will send him to the ER for expedient evaluation

## 2017-12-22 NOTE — ED Triage Notes (Signed)
Pt sent from the [facility] due to fever and elevated wbc.  Pt states he feels "weak all over"  With mild abd discomfort.

## 2017-12-22 NOTE — ED Notes (Signed)
Patient states that he is not having any pain, but feeling very nauseated.

## 2017-12-22 NOTE — ED Provider Notes (Signed)
[facility] EMERGENCY DEPARTMENT Provider Note   CSN: [id number] Arrival date & time: 12/22/17  1945     History   Chief Complaint Chief Complaint  Patient presents with  . Fever    HPI Eric Petro. is a 83 y.o. male.  HPI Patient presents from our adjacent rehabilitation center due to concern of nausea, vomiting, fatigue. Patient arrived at that facility yesterday after a hospitalization for abdominal discomfort, with discovery of C. difficile infection. Family notes that on arrival patient had been improving since his initial diagnosis, with cessation of loose stool, no notable changes. He is currently taking vancomycin orally, no other recent medicine changes. Today, without clear precipitant he developed nausea, has had several episodes of vomiting, has developed p.o. intolerance as well as diffuse abdominal discomfort. With concern of fever as well, the patient was sent here for evaluation. The patient himself states that he feels very poorly, nauseous. He denies abdominal pain, but with minimal palpation does have discomfort.  Past Medical History:  Diagnosis Date  . Atrial flutter ([place])    s/p RFCA  . CAD (coronary artery disease)    cath 2003, occluded S-RCA, occluded S-Dx, L-LAD ok, s/p PTCA to LAD  . Cataract   . CKD (chronic kidney disease) stage 3, GFR 30-59 ml/min (HCC)   . Diabetes mellitus    diet controlled  . Hearing aid worn    bilateral  . History of kidney stones   . Long term (current) use of anticoagulants   . Neuromuscular disorder ([place])   . OSA (obstructive sleep apnea) 12/11   very mild, AHI 7/hr  . Persistent atrial fibrillation ([place]) 09/26/2015  . Pure hyperglyceridemia   . PVD (peripheral vascular disease) ([place])    angioplasty of his right lower extremity in [place] by Dr.[name] 2013  . Unspecified essential hypertension   . Wears dentures    full upper and lower    Patient Active Problem List   Diagnosis Date Noted  .  Gangrene of left foot ([place]) 12/15/2017  . Atherosclerosis of native arteries of the extremities with ulceration ([place]) 11/23/2017  . Cellulitis 11/10/2017  . Chronic anticoagulation 09/26/2015  . Persistent atrial fibrillation ([place]) 09/26/2015  . Olecranon bursitis 04/23/2014  . Preop cardiovascular exam 04/13/2013  . Routine general medical examination at a health care facility 10/10/2012  . PVD (peripheral vascular disease) ([place]) 06/02/2012  . Diabetes mellitus type 2 with peripheral artery disease ([place]) 05/17/2012  . Microhematuria 11/16/2011  . BPH (benign prostatic hyperplasia) 11/16/2011  . Chronic kidney disease, stage III (moderate) ([place]) 08/26/2011  . Leg pain 08/26/2011  . Coronary artery disease 03/25/2011  . OBSTRUCTIVE SLEEP APNEA 09/15/2010  . PERIODIC LIMB MOVEMENT DISORDER 09/15/2010  . SLEEP APNEA 08/26/2010  . PERSONAL HISTORY OF COLONIC POLYPS 10/25/2009  . VITAMIN B12 DEFICIENCY 03/04/2009  . Diabetes mellitus with neuropathy ([place]) 11/24/2006  . HYPERTRIGLYCERIDEMIA 11/24/2006  . Essential hypertension 11/24/2006    Past Surgical History:  Procedure Laterality Date  . ABDOMINAL AORTOGRAM W/LOWER EXTREMITY N/A 11/15/2017   Procedure: ABDOMINAL AORTOGRAM W/LOWER EXTREMITY;  Surgeon: [name], MD;  Location: [place];  Service: Cardiovascular;  Laterality: N/A;  . Adenosine Myoview  3/06   EF 56%, neg. Ischemia  . Adenosine Myoview  02/18/07   nml  . AMPUTATION Left 12/15/2017   Procedure: AMPUTATION BELOW KNEE;  Surgeon: Algernon [name], MD;  Location: [facility];  Service: Vascular;  Laterality: Left;  . ANGIOPLASTY  1/99  CAD- diogonal with rotational artherectomy  . Arthrectomy     of LAD & PTCA  . BLEPHAROPLASTY Bilateral   . CARDIAC CATHETERIZATION  1/00  . CARDIOVERSION  1/04  . CARDIOVERSION  5/07   hospital- a flutter  . CARPAL TUNNEL RELEASE     ? bilateral  . CATARACT EXTRACTION W/PHACO Right 07/28/2017   Procedure: CATARACT  EXTRACTION PHACO AND INTRAOCULAR LENS PLACEMENT ([place]) RIGHT DIABETIC;  Surgeon: [name], MD;  Location: [facility];  Service: Ophthalmology;  Laterality: Right;  Diabetic - diet controlled  . CATARACT EXTRACTION W/PHACO Left 08/18/2017   Procedure: CATARACT EXTRACTION PHACO AND INTRAOCULAR LENS PLACEMENT (IOC);  Surgeon: [name], MD;  Location: [facility];  Service: Ophthalmology;  Laterality: Left;  DIABETES - oral meds  . COLONOSCOPY W/ BIOPSIES  10/01/06   sigmoid polyp bx neg, 3 years  . CORONARY ANGIOPLASTY  4/03   cutting balloon PTCA pLAD into Diag  . CORONARY ARTERY BYPASS GRAFT  2000   LIMA-LAD, SVG-RCA, SVG-Diag; SVG-Diag & SVG-RCA occluded 2003  . FRACTURE SURGERY    . HAND SURGERY  08/27/09   R thumb procedure wit Scaphoid Gragt and screws, Dr [name]  . NM MYOVIEW LTD  4/11   normal  . PERIPHERAL VASCULAR CATHETERIZATION Right 06/27/2015   Procedure: Lower Extremity Angiography;  Surgeon: Algernon [name], MD;  Location: [facility];  Service: Cardiovascular;  Laterality: Right;  . PERIPHERAL VASCULAR CATHETERIZATION  06/27/2015   Procedure: Lower Extremity Intervention;  Surgeon: Algernon [name], MD;  Location: [facility];  Service: Cardiovascular;;        Home Medications    Prior to Admission medications   Medication Sig Start Date End Date Taking? Authorizing Provider  acetaminophen (TYLENOL) 325 MG tablet Take 2 tablets (650 mg total) by mouth every 6 (six) hours as needed for mild pain (or temp >/= 101 F). 12/21/17   Loletha Grayer, MD  amLODipine (NORVASC) 2.5 MG tablet Take 1 tablet (2.5 mg total) by mouth daily. 12/21/17   Loletha Grayer, MD  apixaban (ELIQUIS) 2.5 MG TABS tablet Take 1 tablet (2.5 mg total) by mouth 2 (two) times daily. 11/05/17   [name], MD  B Complex Vitamins (B COMPLEX-B12) TABS Take 1 tablet by mouth daily.     [provider]  Blood Glucose Monitoring Suppl (ONE TOUCH ULTRA  SYSTEM KIT) W/DEVICE KIT 1 kit by Does not apply route once. 11/01/14   [name], MD  calcium carbonate (TUMS - DOSED IN MG ELEMENTAL CALCIUM) 500 MG chewable tablet Chew 1 tablet by mouth daily as needed for indigestion or heartburn.    [provider]  cholecalciferol (VITAMIN D) 1000 UNITS tablet Take 1,000 Units by mouth 2 (two) times a week. Takes on Mon and Fri    [provider]  CINNAMON PO Take 1,000 mg by mouth 2 (two) times daily.     [provider]  fenofibrate 160 MG tablet Take 1 tablet (160 mg total) by mouth daily. 04/23/17   [name], MD  finasteride (PROSCAR) 5 MG tablet Take 1 tablet (5 mg total) by mouth daily. 12/21/17   Loletha Grayer, MD  fish oil-omega-3 fatty acids 1000 MG capsule Take 1,000 mg by mouth daily.     [provider]  glucose blood test strip Use as instructed 11/25/17   [name], MD  lisinopril (PRINIVIL,ZESTRIL) 20 MG tablet Take 1 tablet (20 mg total) by  mouth daily. 12/21/17   Loletha Grayer, MD  metoprolol tartrate (LOPRESSOR) 25 MG tablet Take 0.5 tablets (12.5 mg total) by mouth 2 (two) times daily. 12/21/17   Loletha Grayer, MD  Multiple Vitamin (DAILY MULTIVITAMIN PO) Take 1 tablet by mouth daily.      [provider]  nitroGLYCERIN (NITRODUR - DOSED IN MG/24 HR) 0.2 mg/hr patch Place 1 patch (0.2 mg total) onto the skin daily. 11/24/17   Newt Minion, MD  nutrition supplement, JUVEN, Fanny Dance) PACK Take 1 packet by mouth 2 (two) times daily between meals. 11/16/17   Cristal Ford, DO  [facility] DELICA LANCETS FINE MISC check sugar once daily 08/17/17   [name], MD  oxyCODONE (OXY IR/ROXICODONE) 5 MG immediate release tablet One tab every six hours as needed for pain 12/21/17   Loletha Grayer, MD  pentoxifylline (TRENTAL) 400 MG CR tablet Take 1 tablet (400 mg total) by mouth 3 (three) times daily with meals. 11/24/17   Newt Minion, MD  rosuvastatin (CRESTOR) 40 MG tablet  Take 1 tablet (40 mg total) by mouth daily. 08/20/17   [name], MD  tamsulosin (FLOMAX) 0.4 MG CAPS capsule Take 1 capsule (0.4 mg total) by mouth daily. 12/21/17   Loletha Grayer, MD  vancomycin (VANCOCIN) 50 mg/mL oral solution Take 2.5 mLs (125 mg total) by mouth every 6 (six) hours for 11 days. 12/21/17 01/01/18  Loletha Grayer, MD    Family History Family History  Problem Relation Age of Onset  . Stroke Father   . Aneurysm Father   . Hypertension Father   . Prostate cancer Brother   . Hypertension Mother   . Lung cancer Brother        smoker  . Thrombosis Brother   . Other Brother        RF valve disorder (smoker)  . Lung cancer Brother        smoker  . Breast cancer Sister   . Liver cancer Brother   . Other Sister        cerebral hemorrhage    Social History Social History   Tobacco Use  . Smoking status: Never Smoker  . Smokeless tobacco: Former Network engineer Use Topics  . Alcohol use: No  . Drug use: No     Allergies   Isosorbide mononitrate; Ramipril; and Quinidine gluconate   Review of Systems Review of Systems  Constitutional:       Per HPI, otherwise negative  HENT:       Per HPI, otherwise negative  Respiratory:       Per HPI, otherwise negative  Cardiovascular:       Per HPI, otherwise negative  Gastrointestinal: Positive for nausea and vomiting.  Endocrine:       Negative aside from HPI  Genitourinary:       Neg aside from HPI   Musculoskeletal:       Per HPI, otherwise negative  Skin: Positive for pallor.  Neurological: Positive for weakness. Negative for syncope.     Physical Exam Updated Vital Signs BP 118/60 (BP Location: Left Arm)   Pulse 85   Temp 98.9 F (37.2 C) (Oral)   Resp 15   Ht '5\' 5"'$  (1.651 m)   Wt 72.6 kg (160 lb)   SpO2 98%   BMI 26.63 kg/m   Physical Exam  Constitutional: He is oriented to person, place, and time. He appears well-developed.  unwell-appearing elderly male  HENT:  Head:  Normocephalic  and atraumatic.  Eyes: Conjunctivae and EOM are normal.  Cardiovascular: Normal rate and regular rhythm.  Pulmonary/Chest: Effort normal. No stridor. No respiratory distress.  Abdominal: He exhibits no distension. There is generalized tenderness. There is guarding.  Genitourinary:  Genitourinary Comments: Foley catheter in place draining yellow urine  Musculoskeletal: He exhibits no deformity.  Neurological: He is oriented to person, place, and time.  Patient is oriented appropriately, but has towel over his eyes, is uncomfortable in appearance, but moving all extremities spontaneously, has no gross asymmetry of strength, normal coordination.  Skin: Skin is warm and dry.  Psychiatric: He is withdrawn.  Nursing note and vitals reviewed.    ED Treatments / Results  Labs (all labs ordered are listed, but only abnormal results are displayed) Labs Reviewed  COMPREHENSIVE METABOLIC PANEL - Abnormal; Notable for the following components:      Result Value   Sodium 127 (*)    Chloride 95 (*)    Glucose, Bld 177 (*)    BUN 24 (*)    Calcium 8.5 (*)    Total Protein 6.0 (*)    Albumin 2.5 (*)    AST 53 (*)    GFR calc non Af Amer 60 (*)    All other components within normal limits  CBC WITH DIFFERENTIAL/PLATELET  I-STAT CG4 LACTIC ACID, ED  I-STAT CG4 LACTIC ACID, ED     Radiology No results found.  Procedures Procedures (including critical care time)  Medications Ordered in ED Medications  sodium chloride 0.9 % bolus 500 mL (has no administration in time range)     Initial Impression / Assessment and Plan / ED Course  I have reviewed the triage vital signs and the nursing notes.  Pertinent labs & imaging results that were available during my care of the patient were reviewed by me and considered in my medical decision making (see chart for details).     After the initial evaluation I reviewed the patient's chart including lab studies from earlier tonight,  with notation of sodium value 126, leukocytosis of 29,000 Patient also has evidence for urinary tract infection, with innumerable white blood cells, patient will receive ceftriaxone, he is receiving IV fluids.   Update: Labs here also notable for hyponatremia, 127.   10:07 PM Patient in similar condition, weak in appearance, labs notable for leukocytosis similar to prior, hyponatremia similar to prior, and with her for urinary tract infection, patient has already received ceftriaxone.  Vision is already taking oral vancomycin for his prior C. difficile infection. CT scan reviewed by me not notable. Given the patient's indwelling Foley catheter, concern for sepsis, with evidence for possible urine as a source, the patient will require admission for further evaluation and management.  Final Clinical Impressions(s) / ED Diagnoses  Sepsis Hyponatremia  CRITICAL CARE Performed by: Carmin Muskrat Total critical care time: 35 minutes Critical care time was exclusive of separately billable procedures and treating other patients. Critical care was necessary to treat or prevent imminent or life-threatening deterioration. Critical care was time spent personally by me on the following activities: development of treatment plan with patient and/or surrogate as well as nursing, discussions with consultants, evaluation of patient's response to treatment, examination of patient, obtaining history from patient or surrogate, ordering and performing treatments and interventions, ordering and review of laboratory studies, ordering and review of radiographic studies, pulse oximetry and re-evaluation of patient's condition.    Carmin Muskrat, MD 12/22/17 2215

## 2017-12-23 ENCOUNTER — Encounter (HOSPITAL_COMMUNITY): Payer: Self-pay | Admitting: *Deleted

## 2017-12-23 ENCOUNTER — Other Ambulatory Visit: Payer: Self-pay

## 2017-12-23 ENCOUNTER — Encounter (HOSPITAL_COMMUNITY)
Admission: AD | Admit: 2017-12-23 | Discharge: 2017-12-23 | Disposition: A | Discharge status: Home / Self Care | Payer: PPO | Source: Skilled Nursing Facility | Attending: Internal Medicine | Admitting: Internal Medicine

## 2017-12-23 DIAGNOSIS — S88112D Complete traumatic amputation at level between knee and ankle, left lower leg, subsequent encounter: Secondary | ICD-10-CM | POA: Insufficient documentation

## 2017-12-23 DIAGNOSIS — Z7901 Long term (current) use of anticoagulants: Secondary | ICD-10-CM | POA: Insufficient documentation

## 2017-12-23 DIAGNOSIS — Z4781 Encounter for orthopedic aftercare following surgical amputation: Secondary | ICD-10-CM | POA: Insufficient documentation

## 2017-12-23 DIAGNOSIS — L8990 Pressure ulcer of unspecified site, unspecified stage: Secondary | ICD-10-CM

## 2017-12-23 DIAGNOSIS — I10 Essential (primary) hypertension: Secondary | ICD-10-CM | POA: Insufficient documentation

## 2017-12-23 LAB — CBC WITH DIFFERENTIAL/PLATELET
BASOS PCT: 0 %
Basophils Absolute: 0 10*3/uL (ref 0.0–0.1)
EOS PCT: 0 %
Eosinophils Absolute: 0 10*3/uL (ref 0.0–0.7)
HEMATOCRIT: 27.6 % — AB (ref 39.0–52.0)
Hemoglobin: 9.3 g/dL — ABNORMAL LOW (ref 13.0–17.0)
LYMPHS ABS: 2.2 10*3/uL (ref 0.7–4.0)
Lymphocytes Relative: 8 %
MCH: 30.8 pg (ref 26.0–34.0)
MCHC: 33.7 g/dL (ref 30.0–36.0)
MCV: 91.4 fL (ref 78.0–100.0)
MONOS PCT: 5 %
Monocytes Absolute: 1.4 10*3/uL — ABNORMAL HIGH (ref 0.1–1.0)
NEUTROS ABS: 24.5 10*3/uL — AB (ref 1.7–7.7)
Neutrophils Relative %: 87 %
Platelets: 306 10*3/uL (ref 150–400)
RBC: 3.02 MIL/uL — AB (ref 4.22–5.81)
RDW: 15 % (ref 11.5–15.5)
WBC: 28.1 10*3/uL — AB (ref 4.0–10.5)

## 2017-12-23 LAB — GLUCOSE, CAPILLARY
GLUCOSE-CAPILLARY: 142 mg/dL — AB (ref 65–99)
GLUCOSE-CAPILLARY: 145 mg/dL — AB (ref 65–99)
GLUCOSE-CAPILLARY: 115 mg/dL — AB (ref 65–99)
Glucose-Capillary: 123 mg/dL — ABNORMAL HIGH (ref 65–99)

## 2017-12-23 LAB — BASIC METABOLIC PANEL
Anion gap: 8 (ref 5–15)
BUN: 20 mg/dL (ref 6–20)
CHLORIDE: 97 mmol/L — AB (ref 101–111)
CO2: 25 mmol/L (ref 22–32)
Calcium: 8.4 mg/dL — ABNORMAL LOW (ref 8.9–10.3)
Creatinine, Ser: 0.96 mg/dL (ref 0.61–1.24)
GFR calc non Af Amer: >60 mL/min (ref >60)
Glucose, Bld: 149 mg/dL — ABNORMAL HIGH (ref 65–99)
Potassium: 3.9 mmol/L (ref 3.5–5.1)
SODIUM: 130 mmol/L — AB (ref 135–145)

## 2017-12-23 LAB — MRSA PCR SCREENING: MRSA by PCR: NEGATIVE

## 2017-12-23 MED ORDER — ZINC OXIDE 20 % EX OINT
TOPICAL_OINTMENT | Freq: Three times a day (TID) | CUTANEOUS | Status: DC
  Administered 2017-12-23 – 2017-12-24 (×3): via TOPICAL
  Filled 2017-12-23: qty 28.35

## 2017-12-23 MED ORDER — ONDANSETRON HCL 4 MG/2ML IJ SOLN
4.0000 mg | Freq: Once | INTRAMUSCULAR | Status: AC
  Administered 2017-12-23: 4 mg via INTRAVENOUS

## 2017-12-23 MED ORDER — SODIUM CHLORIDE 0.9 % IV SOLN
1.0000 g | Freq: Two times a day (BID) | INTRAVENOUS | Status: DC
Start: 2017-12-23 — End: 2017-12-25
  Administered 2017-12-23 – 2017-12-25 (×5): 1 g via INTRAVENOUS
  Filled 2017-12-23 (×9): qty 1

## 2017-12-23 MED ORDER — PROMETHAZINE HCL 25 MG/ML IJ SOLN: 12.5000 mg | Freq: Four times a day (QID) | INTRAMUSCULAR | Status: DC | PRN

## 2017-12-23 MED ORDER — ZINC OXIDE 20 % EX OINT
TOPICAL_OINTMENT | Freq: Three times a day (TID) | CUTANEOUS | Status: DC
  Filled 2017-12-23: qty 28.35

## 2017-12-23 MED ORDER — ZINC OXIDE 40 % EX OINT
TOPICAL_OINTMENT | Freq: Three times a day (TID) | CUTANEOUS | Status: DC
  Administered 2017-12-24 – 2017-12-25 (×3): via TOPICAL
  Filled 2017-12-23: qty 113

## 2017-12-23 MED ORDER — SODIUM CHLORIDE 0.9 % IV SOLN
INTRAVENOUS | Status: AC
  Administered 2017-12-23: 10:00:00 via INTRAVENOUS

## 2017-12-23 NOTE — Progress Notes (Signed)
Pharmacy Antibiotic Note  Eric [name]. is a 84 y.o. male admitted on 12/22/2017 with UTI.  Pharmacy has been consulted for cefepime dosing.  Plan: Cefepime 1gm IV q12h F/U cxs and clinical progress Monitor V/S and labs  Height: 5\' 5"  (165.1 cm) Weight: 163 lb 12.8 oz (74.3 kg) IBW/kg (Calculated) : 61.5  Temp (24hrs), Avg:98.9 F (37.2 C), Min:98.6 F (37 C), Max:99.3 F (37.4 C)  Recent Labs  Lab 12/17/17 0340 12/18/17 0630 12/19/17 0606 12/22/17 1750 12/22/17 2035 12/22/17 2045 12/23/17 0700  WBC 7.4 8.8  --  29.3* 30.7*  --  28.1*  CREATININE 1.18 0.99 1.03 1.00 1.08  --  0.96  LATICACIDVEN  --   --   --   --   --  1.49  --     Estimated Creatinine Clearance: 51.1 mL/min (by C-G formula based on SCr of 0.96 mg/dL).    Allergies  Allergen Reactions  . Isosorbide Mononitrate Other (See Comments)    Unknown, doesn't remember   . Ramipril Cough  . Quinidine Gluconate Rash    Antimicrobials this admission: Cefepime 5/16 >>  Ceftriaxone 1gm IV x 1 in ED 5/15 Dose adjustments this admission: N/a  Microbiology results: 5/15 UCx: pending 5/16 MRSA PCR: negative  Thank you for allowing pharmacy to be a part of this patient's care.  Isac Sarna, BS Pharm D, BCPS Clinical Pharmacist Pager [phone] 12/23/2017 9:20 AM

## 2017-12-23 NOTE — Consult Note (Signed)
[facility] Nurse wound consult note Reason for Consult: gluteal fold/buttock area previously treated with a product at home, not on our formulary.  Staff wanted recommendations for what to do. Wound type: MASD to gluteal fold.  Treatment plan is for Desitin 40% three times daily.  While on remote phone cart consult primary RN, Lilia Pro, asked me to look at the darkened area on the lateral right foot close to the fifth toe.  According to the answers I received, there is no drainage from the wound, no odor, no erythema.  I have ordered betadine to be applied twice daily and area covered with Kerlex and the use of a Prevalon boot to the right foot at all times that the patient is in bed.  The patient is diabetic.  The area appears dark purple and per the nurse "Like a blister has burst". Monitor the wound area(s) for worsening of condition such as: Signs/symptoms of infection,  Increase in size,  Development of or worsening of odor, Development of pain, or increased pain at the affected locations.  Notify the medical team if any of these develop.  Thank you for the consult.  Discussed plan of care with the patient and bedside nurse.  [facility] nurse will not follow at this time.  Please re-consult the [facility] team if needed.  Val Riles, RN, MSN, CWOCN, CNS-BC, pager [phone]

## 2017-12-23 NOTE — Progress Notes (Signed)
Pt and his son both state that pt doesn't wear CPAP and has never worn CPAP. Pt is refusing use of CPAP while at [facility]

## 2017-12-23 NOTE — Plan of Care (Signed)
  Problem: Acute Rehab PT Goals(only PT should resolve) Goal: Pt Will Go Supine/Side To Sit Outcome: Progressing Flowsheets (Taken 12/23/2017 1216) Pt will go Supine/Side to Sit: with modified independence Goal: Patient Will Transfer Sit To/From Stand Outcome: Progressing Flowsheets (Taken 12/23/2017 1216) Patient will transfer sit to/from stand: with moderate assist Goal: Pt Will Transfer Bed To Chair/Chair To Bed Outcome: Progressing Flowsheets (Taken 12/23/2017 1216) Pt will Transfer Bed to Chair/Chair to Bed: with mod assist   12:16 PM, 12/23/17 Lonell Grandchild, MPT Physical Therapist with [facility] [phone] office [phone] mobile phone

## 2017-12-23 NOTE — Evaluation (Signed)
Physical Therapy Evaluation Patient Details Name: Eric [name]. MRN: [id number] DOB: 1928-03-27 Today's Date: 12/23/2017   History of Present Illness  Eric [name]. is a 84 y.o. male with medical history significant for coronary artery disease, atrial fibrillation on Eliquis, chronic kidney disease stage II, diet-controlled diabetes mellitus, gangrene of the left foot status post left BKA on 12/15/2017, and recent C. difficile colitis, now presenting from his SNF for evaluation of fever and lethargy.  Patient was discharged from the hospital to SNF yesterday after management of C. difficile colitis.  His diarrhea had essentially resolved and he had been doing much better until today when he was noted to have fever and lethargy.  He denies abdominal pain, but reports nausea without vomiting.  He has a general lethargy, but denies focal numbness or weakness.  He continues oral vancomycin for C. difficile.  Was evaluated by his vascular surgeon 2 days ago and the BKA stump was healing well at that time.    Clinical Impression  Patient demonstrates slow labored movement for sitting up at bedside and scooting forward/laterally at bedside.  Patent apprehensive to attempt sit to stands due to c/o fatigue and sit to stands/transfers not attempted due to bleeding from wound on lateral side of right foot - RN aware in room to dress wound.  Patient will benefit from continued physical therapy in hospital and recommended venue below to increase strength, balance, endurance for safe ADLs, functional mobility and pre-gait.    Follow Up Recommendations SNF;Supervision for mobility/OOB    Equipment Recommendations  None recommended by PT    Recommendations for Other Services       Precautions / Restrictions Precautions Precautions: Fall Precaution Comments: enteric Restrictions Weight Bearing Restrictions: Yes LLE Weight Bearing: Non weight bearing      Mobility  Bed Mobility Overal bed  mobility: Needs Assistance Bed Mobility: Supine to Sit;Sit to Supine     Supine to sit: Min guard Sit to supine: Min guard      Transfers                    Ambulation/Gait                Stairs            Wheelchair Mobility    Modified Rankin (Stroke Patients Only)       Balance Overall balance assessment: Needs assistance Sitting-balance support: Feet supported;No upper extremity supported Sitting balance-Leahy Scale: Good                                       Pertinent Vitals/Pain Pain Assessment: Faces Faces Pain Scale: Hurts little more Pain Location: chest/abdominal areas due to coughing Pain Descriptors / Indicators: Sore;Discomfort Pain Intervention(s): Limited activity within patient's tolerance;Monitored during session    Home Living   Living Arrangements: Spouse/significant other Available Help at Discharge: Family Type of Home: House Home Access: Level entry     Home Layout: Laundry or work area in [place]: Kasandra Knudsen - single point;Walker - 4 wheels;Bedside commode;Shower seat;Grab bars - tub/shower Additional Comments: level entry walk in basement with chair lift to main floor.     Prior Function Level of Independence: Independent with assistive device(s)         Comments: h[place]uehold, short distanced community with cane, drives     Hand Dominance   Dominant Hand:  Right    Extremity/Trunk Assessment   Upper Extremity Assessment Upper Extremity Assessment: Generalized weakness    Lower Extremity Assessment Lower Extremity Assessment: Generalized weakness;LLE deficits/detail LLE Deficits / Details: grossly -3/5    Cervical / Trunk Assessment Cervical / Trunk Assessment: Normal  Communication   Communication: No difficulties  Cognition Arousal/Alertness: Awake/alert Behavior During Therapy: WFL for tasks assessed/performed Overall Cognitive Status: Within Functional Limits for tasks  assessed                                        General Comments      Exercises     Assessment/Plan    PT Assessment Patient needs continued PT services  PT Problem List Decreased strength;Decreased activity tolerance;Decreased balance;Decreased mobility;Decreased knowledge of use of DME       PT Treatment Interventions Gait training;Functional mobility training;Therapeutic activities;Therapeutic exercise;Patient/family education;Wheelchair mobility training    PT Goals (Current goals can be found in the Care Plan section)  Acute Rehab PT Goals Patient Stated Goal: return home after rehab PT Goal Formulation: With patient/family Time For Goal Achievement: 01/06/18 Potential to Achieve Goals: Good    Frequency Min 4X/week   Barriers to discharge        Co-evaluation               AM-PAC PT "6 Clicks" Daily Activity  Outcome Measure Difficulty turning over in bed (including adjusting bedclothes, sheets and blankets)?: A Little Difficulty moving from lying on back to sitting on the side of the bed? : A Little Difficulty sitting down on and standing up from a chair with arms (e.g., wheelchair, bedside commode, etc,.)?: Unable Help needed moving to and from a bed to chair (including a wheelchair)?: Total Help needed walking in hospital room?: Total Help needed climbing 3-5 steps with a railing? : Total 6 Click Score: 10    End of Session   Activity Tolerance: Patient tolerated treatment well;Patient limited by fatigue(Patient limited due to bleeding from wound lateral side right foot) Patient left: in bed;with call bell/phone within reach Nurse Communication: Mobility status PT Visit Diagnosis: Unsteadiness on feet (R26.81);Other abnormalities of gait and mobility (R26.89);Muscle weakness (generalized) (M62.81)    Time: 4268-3419 PT Time Calculation (min) (ACUTE ONLY): 27 min   Charges:   PT Evaluation $PT Eval Moderate Complexity: 1 Mod PT  Treatments $Therapeutic Activity: 23-37 mins   PT G Codes:        12:13 PM, 01/11/2018 Lonell Grandchild, MPT Physical Therapist with [facility] [phone] office [phone] mobile phone

## 2017-12-23 NOTE — Progress Notes (Addendum)
PROGRESS NOTE    Eric [name].  CHE:[id number]  DOB: 1928-08-01  DOA: 12/22/2017 PCP: Eric Frizzle, MD   Brief Admission Hx: [name]. is a 84 y.o. male with medical history significant for coronary artery disease, atrial fibrillation on Eliquis, chronic kidney disease stage II, diet-controlled diabetes mellitus, gangrene of the left foot status post left BKA on 12/15/2017, and recent C. difficile colitis, now presenting from his SNF for evaluation of fever and lethargy.  He presented with UTI.   MDM/Assessment & Plan:   1. UTI - continue empiric antibiotic coverage pending urine culture findings.  Continue supportive care. 2. C. difficile colitis- he is having formed stools now.  Continue oral vancomycin to complete course of treatment.  The CT of the abdomen and pelvis did not show acute inflammation or complications. 3. Hyponatremia-likely secondary to hypovolemia-treating with normal saline.  Recheck in the morning.  Discontinued hydrochlorothiazide last week. 4. Normocytic anemia- will follow closely, continue B12 supplementation.  Suspect this is secondary to chronic disease and recent amputation. 5. Chronic atrial fibrillation-rate is controlled, continue Eliquis for anticoagulation and metoprolol for rate control. 6. Stage II CKD- follow closely with serial BMP testing.  Avoid nephrotoxins. 7. Postop day #8 status post left BKA-patient had left TKA on 12/15/2017 for treatment of left foot gangrene, he did see the surgeon on 5/13 and was doing well.  Continue physical therapy and wound care. 8. Diet-controlled type 2 diabetes mellitus-continue supplemental sliding scale as needed while in the hospital and test blood glucose regularly. 9. BPH with urinary retention-resume Flomax therapy, continue foley cath, follow up with urology outpatient for voiding trial foley removal.  DVT prophylaxis: Eliquis Code Status: Full Family Communication: Daughter and wife at  bedside Disposition Plan: Ongoing acute inpatient treatment needed  Subjective: The patient reports that he feels very tired and worn out.  Objective: Vitals:   12/22/17 2200 12/22/17 2230 12/23/17 0045 12/23/17 0451  BP: (!) 142/68 (!) 154/72 (!) 143/65 (!) 143/57  Pulse: 86 91 90 90  Resp:   18 16  Temp:   98.9 F (37.2 C) 98.6 F (37 C)  TempSrc:   Oral Oral  SpO2: 96% 100% 97% 97%  Weight:   74.2 kg (163 lb 9.3 oz) 74.3 kg (163 lb 12.8 oz)  Height:   5\' 5"  (1.651 m)     Intake/Output Summary (Last 24 hours) at 12/23/2017 1119 Last data filed at 12/23/2017 0800 Gross per 24 hour  Intake 120 ml  Output 1075 ml  Net -955 ml   Filed Weights   12/22/17 2000 12/23/17 0045 12/23/17 0451  Weight: 72.6 kg (160 lb) 74.2 kg (163 lb 9.3 oz) 74.3 kg (163 lb 12.8 oz)     REVIEW OF SYSTEMS  As per history otherwise all reviewed and reported negative  Exam:  General exam: Chronically ill-appearing elderly male awake alert in no apparent distress cooperative. Respiratory system: Clear. No increased work of breathing. Cardiovascular system: Normal S1 & S2 heard. No JVD, murmurs, gallops, clicks or pedal edema. Gastrointestinal system: Abdomen is nondistended, soft and nontender. Normal bowel sounds heard. Central nervous system: Alert and oriented. No focal neurological deficits. Extremities: Left BKA wound healing well.  Data Reviewed: Basic Metabolic Panel: Recent Labs  Lab 12/18/17 0630 12/19/17 0606 12/22/17 1750 12/22/17 2035 12/23/17 0700  NA 132* 135 126* 127* 130*  K 3.7 3.9 4.2 4.1 3.9  CL 103 106 96* 95* 97*  CO2 25 26 23  24  25  GLUCOSE 118* 109* 263* 177* 149*  BUN 22* 21* 24* 24* 20  CREATININE 0.99 1.03 1.00 1.08 0.96  CALCIUM 8.2* 8.3* 8.4* 8.5* 8.4*  MG 1.3* 2.0  --   --   --    Liver Function Tests: Recent Labs  Lab 12/22/17 1750 12/22/17 2035  AST 54* 53*  ALT 31 32  ALKPHOS 71 76  BILITOT 0.6 0.9  PROT 5.6* 6.0*  ALBUMIN 2.3* 2.5*   No  results for input(s): LIPASE, AMYLASE in the last 168 hours. No results for input(s): AMMONIA in the last 168 hours. CBC: Recent Labs  Lab 12/17/17 0340 12/18/17 0630 12/22/17 1750 12/22/17 2035 12/23/17 0700  WBC 7.4 8.8 29.3* 30.7* 28.1*  NEUTROABS  --   --  24.4 24.9* 24.5*  HGB 10.0* 10.0* 9.2* 9.4* 9.3*  HCT 29.4* 29.3* 27.9* 28.3* 27.6*  MCV 92.1 92.1 91.2 92.2 91.4  PLT 236 229 304 315 306   Cardiac Enzymes: No results for input(s): CKTOTAL, CKMB, CKMBINDEX, TROPONINI in the last 168 hours. CBG (last 3)  Recent Labs    12/22/17 2355 12/23/17 0715 12/23/17 1115  GLUCAP 132* 142* 145*   Recent Results (from the past 240 hour(s))  MRSA PCR Screening     Status: None   Collection Time: 12/23/17  4:33 AM  Result Value Ref Range Status   MRSA by PCR NEGATIVE NEGATIVE Final    Comment:        The GeneXpert MRSA Assay (FDA approved for NASAL specimens only), is one component of a comprehensive MRSA colonization surveillance program. It is not intended to diagnose MRSA infection nor to guide or monitor treatment for MRSA infections. Performed at [facility], [address], [place], [place] [address]      Studies: Ct Abdomen Pelvis Wo Contrast  Result Date: 12/22/2017 CLINICAL DATA:  Fever and elevated white count EXAM: CT ABDOMEN AND PELVIS WITHOUT CONTRAST TECHNIQUE: Multidetector CT imaging of the abdomen and pelvis was performed following the standard protocol without IV contrast. COMPARISON:  04/21/2016 FINDINGS: Lower chest: Lung bases demonstrate no acute consolidation or pleural effusion. Lower sternotomy changes. Cardiomegaly with coronary vascular calcification. Hepatobiliary: Cyst in the left hepatic lobe. Additional subcentimeter hypodensities in the right lobe too small to further characterize. Calcified stone in the gallbladder. No biliary dilatation. Pancreas: Unremarkable. No pancreatic ductal dilatation or surrounding inflammatory changes. Spleen:  Normal in size without focal abnormality. Adrenals/Urinary Tract: Adrenal glands are within normal limits. No hydronephrosis. Air in the bladder. Foley catheter in the bladder. Thick-walled appearance of the bladder with irregular margin. Stomach/Bowel: Stomach is within normal limits. Appendix appears normal. No evidence of bowel wall thickening, distention, or inflammatory changes. Sigmoid and descending colon diverticular disease without acute inflammation Vascular/Lymphatic: Moderate aortic atherosclerosis. No aneurysmal dilatation. No significantly enlarged lymph nodes Reproductive: Enlarged prostate gland Other: Negative for free air or free fluid. Musculoskeletal: Degenerative changes. No acute or suspicious abnormality. IMPRESSION: 1. Thick wall irregular appearance of the bladder, which may be secondary to cystitis or chronic obstruction. Bladder is decompressed by Foley catheter. 2. Otherwise no CT evidence for acute intra-abdominal or pelvic abnormality. 3. Gallstone 4. Diverticular disease of the colon without acute inflammation 5. Cardiomegaly Electronically Signed   By: Donavan Foil M.D.   On: 12/22/2017 22:12   Dg Chest 2 View  Result Date: 12/22/2017 CLINICAL DATA:  Fever with weakness and nausea EXAM: CHEST - 2 VIEW COMPARISON:  08/26/2010 FINDINGS: Post sternotomy changes. Mild hyperinflation. No focal  airspace disease or pleural effusion. Mild cardiomegaly. No pneumothorax. Mild degenerative changes of the spine. IMPRESSION: No active cardiopulmonary disease.  Mild cardiomegaly. Electronically Signed   By: Donavan Foil M.D.   On: 12/22/2017 20:05     Scheduled Meds: . amLODipine  2.5 mg Oral Daily  . apixaban  2.5 mg Oral BID  . B-complex with vitamin C  1 tablet Oral Daily  . finasteride  5 mg Oral Daily  . insulin aspart  0-5 Units Subcutaneous QHS  . insulin aspart  0-9 Units Subcutaneous TID WC  . lisinopril  20 mg Oral Daily  . liver oil-zinc oxide   Topical TID BM  .  metoprolol tartrate  12.5 mg Oral BID  . multivitamin with minerals   Oral Daily  . nitroGLYCERIN  0.2 mg Transdermal Daily  . nutrition supplement (JUVEN)  1 packet Oral BID BM  . omega-3 acid ethyl esters  1,000 mg Oral Daily  . pentoxifylline  400 mg Oral TID WC  . rosuvastatin  40 mg Oral q1800  . sodium chloride flush  3 mL Intravenous Q12H  . tamsulosin  0.4 mg Oral Daily  . vancomycin  125 mg Oral Q6H  . VENELEX  1 application Apply externally TID   Continuous Infusions: . sodium chloride    . sodium chloride 60 mL/hr at 12/23/17 0943  . ceFEPime (MAXIPIME) IV      Principal Problem:   Acute lower UTI Active Problems:   Essential hypertension   Coronary artery disease   CKD (chronic kidney disease), stage II   Persistent atrial fibrillation (HCC)   Gangrene of left foot (HCC)   C. difficile colitis   Hyponatremia   Normocytic anemia   Acute UTI   Pressure injury of skin   Time spent:   Irwin Brakeman, MD, FAAFP [facility] Pager [phone]  If 7PM-7AM, please contact night-coverage [email or web address] Password [email or web address] 12/23/2017, 11:19 AM    LOS: 1 day

## 2017-12-23 NOTE — Progress Notes (Signed)
Rehab Admissions Coordinator Note:  Patient was screened by Retta Diones for appropriateness for an Inpatient Acute Rehab Consult.  At this time, we are recommending [place].  Patient was admitted from SNF and is most appropriate for SNF at this point.  Retta Diones 12/23/2017, 1:59 PM  I can be reached at [phone].

## 2017-12-23 NOTE — Clinical Social Work Note (Signed)
LCSW notified [facility] that patient had been rehospitalized. LCSW restarted new [facility] STR SNF authorization.     Kaileia Flow, Clydene Pugh, LCSW

## 2017-12-24 ENCOUNTER — Ambulatory Visit (INDEPENDENT_AMBULATORY_CARE_PROVIDER_SITE_OTHER): Payer: PPO | Admitting: Vascular Surgery

## 2017-12-24 ENCOUNTER — Encounter (INDEPENDENT_AMBULATORY_CARE_PROVIDER_SITE_OTHER): Payer: PPO

## 2017-12-24 LAB — CBC WITH DIFFERENTIAL/PLATELET
BASOS ABS: 0 10*3/uL (ref 0.0–0.1)
BASOS PCT: 0 %
EOS ABS: 0.4 10*3/uL (ref 0.0–0.7)
EOS PCT: 2 %
HCT: 26.5 % — ABNORMAL LOW (ref 39.0–52.0)
Hemoglobin: 8.7 g/dL — ABNORMAL LOW (ref 13.0–17.0)
Lymphocytes Relative: 12 %
Lymphs Abs: 2.3 10*3/uL (ref 0.7–4.0)
MCH: 30.4 pg (ref 26.0–34.0)
MCHC: 32.8 g/dL (ref 30.0–36.0)
MCV: 92.7 fL (ref 78.0–100.0)
MONO ABS: 0.9 10*3/uL (ref 0.1–1.0)
Monocytes Relative: 4 %
Neutro Abs: 15.8 10*3/uL — ABNORMAL HIGH (ref 1.7–7.7)
Neutrophils Relative %: 82 %
PLATELETS: 303 10*3/uL (ref 150–400)
RBC: 2.86 MIL/uL — ABNORMAL LOW (ref 4.22–5.81)
RDW: 15.1 % (ref 11.5–15.5)
WBC: 19.4 10*3/uL — ABNORMAL HIGH (ref 4.0–10.5)

## 2017-12-24 LAB — COMPREHENSIVE METABOLIC PANEL
ALT: 27 U/L (ref 17–63)
AST: 42 U/L — ABNORMAL HIGH (ref 15–41)
Albumin: 2.1 g/dL — ABNORMAL LOW (ref 3.5–5.0)
Alkaline Phosphatase: 69 U/L (ref 38–126)
Anion gap: 6 (ref 5–15)
BUN: 21 mg/dL — ABNORMAL HIGH (ref 6–20)
CO2: 23 mmol/L (ref 22–32)
Calcium: 8.2 mg/dL — ABNORMAL LOW (ref 8.9–10.3)
Chloride: 101 mmol/L (ref 101–111)
Creatinine, Ser: 0.88 mg/dL (ref 0.61–1.24)
GFR calc Af Amer: >60 mL/min (ref >60)
GFR calc non Af Amer: >60 mL/min (ref >60)
Glucose, Bld: 180 mg/dL — ABNORMAL HIGH (ref 65–99)
Potassium: 4.3 mmol/L (ref 3.5–5.1)
Sodium: 130 mmol/L — ABNORMAL LOW (ref 135–145)
Total Bilirubin: 0.5 mg/dL (ref 0.3–1.2)
Total Protein: 5.4 g/dL — ABNORMAL LOW (ref 6.5–8.1)

## 2017-12-24 LAB — GLUCOSE, CAPILLARY
GLUCOSE-CAPILLARY: 156 mg/dL — AB (ref 65–99)
Glucose-Capillary: 118 mg/dL — ABNORMAL HIGH (ref 65–99)
Glucose-Capillary: 130 mg/dL — ABNORMAL HIGH (ref 65–99)
Glucose-Capillary: 140 mg/dL — ABNORMAL HIGH (ref 65–99)

## 2017-12-24 LAB — MAGNESIUM: Magnesium: 1.4 mg/dL — ABNORMAL LOW (ref 1.7–2.4)

## 2017-12-24 NOTE — Progress Notes (Signed)
PROGRESS NOTE    Eric [name].  EVO:[id number]  DOB: 20-May-1928  DOA: 12/22/2017 PCP: [name], MD   Brief Admission Hx: Audrey Eller. is a 84 y.o. male with medical history significant for coronary artery disease, atrial fibrillation on Eliquis, chronic kidney disease stage II, diet-controlled diabetes mellitus, gangrene of the left foot status post left BKA on 12/15/2017, and recent C. difficile colitis, now presenting from his SNF for evaluation of fever and lethargy.  He presented with UTI.   MDM/Assessment & Plan:   1. UTI - continue empiric antibiotic coverage pending urine culture findings.  Continue supportive care. 2. C. difficile colitis- he is having formed stools now.  Continue oral vancomycin to complete course of treatment.  The CT of the abdomen and pelvis did not show acute inflammation or complications. 3. Hyponatremia-likely secondary to hypovolemia-treating with normal saline.  Recheck in the morning.  Discontinued hydrochlorothiazide last week. 4. Normocytic anemia- will follow closely, continue B12 supplementation.  Suspect this is secondary to chronic disease and recent amputation. 5. Chronic atrial fibrillation-rate is controlled, continue Eliquis for anticoagulation and metoprolol for rate control. 6. Stage II CKD- follow closely with serial BMP testing.  Avoid nephrotoxins. 7. Postop status post left BKA-patient had left TKA on 12/15/2017 for treatment of left foot gangrene, he did see the surgeon on 5/13 and was doing well.  Continue physical therapy and wound care. 8. Diet-controlled type 2 diabetes mellitus-continue supplemental sliding scale as needed while in the hospital and test blood glucose regularly. 9. BPH with urinary retention-resume Flomax therapy, continue foley cath, follow up with urology outpatient for voiding trial foley removal.  DVT prophylaxis: Eliquis Code Status: Full Family Communication: Daughter and wife at  bedside Disposition Plan: Ongoing acute inpatient treatment needed  Subjective: The patient says that he feels better today.  He is eating better this morning.    Objective: Vitals:   12/23/17 2132 12/24/17 0500 12/24/17 0545 12/24/17 1018  BP: 129/66  (!) 161/77 140/72  Pulse: 79  80 81  Resp: 17  16   Temp: 98.8 F (37.1 C)     TempSrc: Oral     SpO2: 100%  100% 100%  Weight:  75.6 kg (166 lb 10.7 oz)    Height:        Intake/Output Summary (Last 24 hours) at 12/24/2017 1348 Last data filed at 12/24/2017 1032 Gross per 24 hour  Intake 2140 ml  Output 2850 ml  Net -710 ml   Filed Weights   12/23/17 0045 12/23/17 0451 12/24/17 0500  Weight: 74.2 kg (163 lb 9.3 oz) 74.3 kg (163 lb 12.8 oz) 75.6 kg (166 lb 10.7 oz)     REVIEW OF SYSTEMS  As per history otherwise all reviewed and reported negative  Exam:  General exam: Chronically ill-appearing elderly male awake alert in no apparent distress cooperative. Respiratory system: Clear. No increased work of breathing. Cardiovascular system: Normal S1 & S2 heard. No JVD, murmurs, gallops, clicks or pedal edema. Gastrointestinal system: Abdomen is nondistended, soft and nontender. Normal bowel sounds heard. Central nervous system: Alert and oriented. No focal neurological deficits. Extremities: Left BKA wound healing well.  Data Reviewed: Basic Metabolic Panel: Recent Labs  Lab 12/18/17 0630 12/19/17 0606 12/22/17 1750 12/22/17 2035 12/23/17 0700 12/24/17 0529  NA 132* 135 126* 127* 130* 130*  K 3.7 3.9 4.2 4.1 3.9 4.3  CL 103 106 96* 95* 97* 101  CO2 25 26 23 24 25  23  GLUCOSE 118* 109* 263* 177* 149* 180*  BUN 22* 21* 24* 24* 20 21*  CREATININE 0.99 1.03 1.00 1.08 0.96 0.88  CALCIUM 8.2* 8.3* 8.4* 8.5* 8.4* 8.2*  MG 1.3* 2.0  --   --   --  1.4*   Liver Function Tests: Recent Labs  Lab 12/22/17 1750 12/22/17 2035 12/24/17 0529  AST 54* 53* 42*  ALT 31 32 27  ALKPHOS 71 76 69  BILITOT 0.6 0.9 0.5  PROT  5.6* 6.0* 5.4*  ALBUMIN 2.3* 2.5* 2.1*   No results for input(s): LIPASE, AMYLASE in the last 168 hours. No results for input(s): AMMONIA in the last 168 hours. CBC: Recent Labs  Lab 12/18/17 0630 12/22/17 1750 12/22/17 2035 12/23/17 0700 12/24/17 0529  WBC 8.8 29.3* 30.7* 28.1* 19.4*  NEUTROABS  --  24.4 24.9* 24.5* 15.8*  HGB 10.0* 9.2* 9.4* 9.3* 8.7*  HCT 29.3* 27.9* 28.3* 27.6* 26.5*  MCV 92.1 91.2 92.2 91.4 92.7  PLT 229 304 315 306 303   Cardiac Enzymes: No results for input(s): CKTOTAL, CKMB, CKMBINDEX, TROPONINI in the last 168 hours. CBG (last 3)  Recent Labs    12/23/17 2133 12/24/17 0731 12/24/17 1127  GLUCAP 115* 118* 130*   Recent Results (from the past 240 hour(s))  Culture, Urine     Status: Abnormal (Preliminary result)   Collection Time: 12/22/17  5:50 PM  Result Value Ref Range Status   Specimen Description   Final    URINE, CATHETERIZED Performed at [facility], [address] [place], [place] [address]    Special Requests   Final    NONE Performed at [facility], [address] [place], [place] [address]    Culture >=100,000 COLONIES/mL PSEUDOMONAS AERUGINOSA (A)  Final   Report Status PENDING  Incomplete  MRSA PCR Screening     Status: None   Collection Time: 12/23/17  4:33 AM  Result Value Ref Range Status   MRSA by PCR NEGATIVE NEGATIVE Final    Comment:        The GeneXpert MRSA Assay (FDA approved for NASAL specimens only), is one component of a comprehensive MRSA colonization surveillance program. It is not intended to diagnose MRSA infection nor to guide or monitor treatment for MRSA infections. Performed at [facility], [address], [place], [place] [address]      Studies: Ct Abdomen Pelvis Wo Contrast  Result Date: 12/22/2017 CLINICAL DATA:  Fever and elevated white count EXAM: CT ABDOMEN AND PELVIS WITHOUT CONTRAST TECHNIQUE: Multidetector CT imaging of the abdomen and pelvis was performed following the standard  protocol without IV contrast. COMPARISON:  04/21/2016 FINDINGS: Lower chest: Lung bases demonstrate no acute consolidation or pleural effusion. Lower sternotomy changes. Cardiomegaly with coronary vascular calcification. Hepatobiliary: Cyst in the left hepatic lobe. Additional subcentimeter hypodensities in the right lobe too small to further characterize. Calcified stone in the gallbladder. No biliary dilatation. Pancreas: Unremarkable. No pancreatic ductal dilatation or surrounding inflammatory changes. Spleen: Normal in size without focal abnormality. Adrenals/Urinary Tract: Adrenal glands are within normal limits. No hydronephrosis. Air in the bladder. Foley catheter in the bladder. Thick-walled appearance of the bladder with irregular margin. Stomach/Bowel: Stomach is within normal limits. Appendix appears normal. No evidence of bowel wall thickening, distention, or inflammatory changes. Sigmoid and descending colon diverticular disease without acute inflammation Vascular/Lymphatic: Moderate aortic atherosclerosis. No aneurysmal dilatation. No significantly enlarged lymph nodes Reproductive: Enlarged prostate gland Other: Negative for free air or free fluid. Musculoskeletal: Degenerative changes. No acute or  suspicious abnormality. IMPRESSION: 1. Thick wall irregular appearance of the bladder, which may be secondary to cystitis or chronic obstruction. Bladder is decompressed by Foley catheter. 2. Otherwise no CT evidence for acute intra-abdominal or pelvic abnormality. 3. Gallstone 4. Diverticular disease of the colon without acute inflammation 5. Cardiomegaly Electronically Signed   By: Donavan Foil M.D.   On: 12/22/2017 22:12   Dg Chest 2 View  Result Date: 12/22/2017 CLINICAL DATA:  Fever with weakness and nausea EXAM: CHEST - 2 VIEW COMPARISON:  08/26/2010 FINDINGS: Post sternotomy changes. Mild hyperinflation. No focal airspace disease or pleural effusion. Mild cardiomegaly. No pneumothorax. Mild  degenerative changes of the spine. IMPRESSION: No active cardiopulmonary disease.  Mild cardiomegaly. Electronically Signed   By: Donavan Foil M.D.   On: 12/22/2017 20:05     Scheduled Meds: . amLODipine  2.5 mg Oral Daily  . apixaban  2.5 mg Oral BID  . B-complex with vitamin C  1 tablet Oral Daily  . finasteride  5 mg Oral Daily  . insulin aspart  0-5 Units Subcutaneous QHS  . insulin aspart  0-9 Units Subcutaneous TID WC  . lisinopril  20 mg Oral Daily  . liver oil-zinc oxide   Topical TID BM  . metoprolol tartrate  12.5 mg Oral BID  . multivitamin with minerals   Oral Daily  . nitroGLYCERIN  0.2 mg Transdermal Daily  . nutrition supplement (JUVEN)  1 packet Oral BID BM  . omega-3 acid ethyl esters  1,000 mg Oral Daily  . pentoxifylline  400 mg Oral TID WC  . rosuvastatin  40 mg Oral q1800  . sodium chloride flush  3 mL Intravenous Q12H  . tamsulosin  0.4 mg Oral Daily  . vancomycin  125 mg Oral Q6H   Continuous Infusions: . sodium chloride    . ceFEPime (MAXIPIME) IV Stopped (12/24/17 1120)    Principal Problem:   Acute lower UTI Active Problems:   Essential hypertension   Coronary artery disease   CKD (chronic kidney disease), stage II   Persistent atrial fibrillation (HCC)   Gangrene of left foot (HCC)   C. difficile colitis   Hyponatremia   Normocytic anemia   Acute UTI   Pressure injury of skin  Time spent:   Irwin Brakeman, MD, FAAFP [facility] Pager [phone]  If 7PM-7AM, please contact night-coverage [email or web address] Password [email or web address] 12/24/2017, 1:48 PM    LOS: 2 days

## 2017-12-24 NOTE — Progress Notes (Signed)
Pt has refused nutritional supplement Juven. Pt states that he feels "very sick" whenever he drinks it.

## 2017-12-24 NOTE — Care Management Important Message (Signed)
Important Message  Patient Details  Name: Eric [name]. MRN: [id number] Date of Birth: 1927-12-17   Medicare Important Message Given:  Yes    Shelda Altes 12/24/2017, 11:09 AM

## 2017-12-25 DIAGNOSIS — E1165 Type 2 diabetes mellitus with hyperglycemia: Secondary | ICD-10-CM | POA: Diagnosis not present

## 2017-12-25 DIAGNOSIS — Z87891 Personal history of nicotine dependence: Secondary | ICD-10-CM | POA: Diagnosis not present

## 2017-12-25 DIAGNOSIS — T83511D Infection and inflammatory reaction due to indwelling urethral catheter, subsequent encounter: Secondary | ICD-10-CM | POA: Diagnosis not present

## 2017-12-25 DIAGNOSIS — N183 Chronic kidney disease, stage 3 (moderate): Secondary | ICD-10-CM | POA: Diagnosis not present

## 2017-12-25 DIAGNOSIS — I4891 Unspecified atrial fibrillation: Secondary | ICD-10-CM | POA: Diagnosis not present

## 2017-12-25 DIAGNOSIS — I739 Peripheral vascular disease, unspecified: Secondary | ICD-10-CM | POA: Diagnosis not present

## 2017-12-25 DIAGNOSIS — Z7901 Long term (current) use of anticoagulants: Secondary | ICD-10-CM | POA: Diagnosis not present

## 2017-12-25 DIAGNOSIS — A498 Other bacterial infections of unspecified site: Secondary | ICD-10-CM

## 2017-12-25 DIAGNOSIS — T368X5A Adverse effect of other systemic antibiotics, initial encounter: Secondary | ICD-10-CM | POA: Diagnosis not present

## 2017-12-25 DIAGNOSIS — D649 Anemia, unspecified: Secondary | ICD-10-CM | POA: Diagnosis not present

## 2017-12-25 DIAGNOSIS — I10 Essential (primary) hypertension: Secondary | ICD-10-CM | POA: Diagnosis not present

## 2017-12-25 DIAGNOSIS — Z9861 Coronary angioplasty status: Secondary | ICD-10-CM | POA: Diagnosis not present

## 2017-12-25 DIAGNOSIS — I2583 Coronary atherosclerosis due to lipid rich plaque: Secondary | ICD-10-CM | POA: Diagnosis not present

## 2017-12-25 DIAGNOSIS — R279 Unspecified lack of coordination: Secondary | ICD-10-CM | POA: Diagnosis not present

## 2017-12-25 DIAGNOSIS — I481 Persistent atrial fibrillation: Secondary | ICD-10-CM | POA: Diagnosis not present

## 2017-12-25 DIAGNOSIS — R001 Bradycardia, unspecified: Secondary | ICD-10-CM | POA: Diagnosis not present

## 2017-12-25 DIAGNOSIS — R339 Retention of urine, unspecified: Secondary | ICD-10-CM | POA: Diagnosis not present

## 2017-12-25 DIAGNOSIS — E1151 Type 2 diabetes mellitus with diabetic peripheral angiopathy without gangrene: Secondary | ICD-10-CM | POA: Diagnosis not present

## 2017-12-25 DIAGNOSIS — Z89512 Acquired absence of left leg below knee: Secondary | ICD-10-CM | POA: Diagnosis not present

## 2017-12-25 DIAGNOSIS — M6281 Muscle weakness (generalized): Secondary | ICD-10-CM | POA: Diagnosis not present

## 2017-12-25 DIAGNOSIS — I2510 Atherosclerotic heart disease of native coronary artery without angina pectoris: Secondary | ICD-10-CM | POA: Diagnosis not present

## 2017-12-25 DIAGNOSIS — R1111 Vomiting without nausea: Secondary | ICD-10-CM | POA: Diagnosis not present

## 2017-12-25 DIAGNOSIS — Z978 Presence of other specified devices: Secondary | ICD-10-CM | POA: Diagnosis not present

## 2017-12-25 DIAGNOSIS — Z8249 Family history of ischemic heart disease and other diseases of the circulatory system: Secondary | ICD-10-CM | POA: Diagnosis not present

## 2017-12-25 DIAGNOSIS — Z974 Presence of external hearing-aid: Secondary | ICD-10-CM | POA: Diagnosis not present

## 2017-12-25 DIAGNOSIS — G4733 Obstructive sleep apnea (adult) (pediatric): Secondary | ICD-10-CM | POA: Diagnosis not present

## 2017-12-25 DIAGNOSIS — E1152 Type 2 diabetes mellitus with diabetic peripheral angiopathy with gangrene: Secondary | ICD-10-CM | POA: Diagnosis not present

## 2017-12-25 DIAGNOSIS — N401 Enlarged prostate with lower urinary tract symptoms: Secondary | ICD-10-CM | POA: Diagnosis not present

## 2017-12-25 DIAGNOSIS — E1122 Type 2 diabetes mellitus with diabetic chronic kidney disease: Secondary | ICD-10-CM | POA: Diagnosis not present

## 2017-12-25 DIAGNOSIS — I1310 Hypertensive heart and chronic kidney disease without heart failure, with stage 1 through stage 4 chronic kidney disease, or unspecified chronic kidney disease: Secondary | ICD-10-CM | POA: Diagnosis not present

## 2017-12-25 DIAGNOSIS — N182 Chronic kidney disease, stage 2 (mild): Secondary | ICD-10-CM | POA: Diagnosis not present

## 2017-12-25 DIAGNOSIS — N179 Acute kidney failure, unspecified: Secondary | ICD-10-CM | POA: Diagnosis not present

## 2017-12-25 DIAGNOSIS — B965 Pseudomonas (aeruginosa) (mallei) (pseudomallei) as the cause of diseases classified elsewhere: Secondary | ICD-10-CM | POA: Diagnosis not present

## 2017-12-25 DIAGNOSIS — R112 Nausea with vomiting, unspecified: Secondary | ICD-10-CM | POA: Diagnosis not present

## 2017-12-25 DIAGNOSIS — K858 Other acute pancreatitis without necrosis or infection: Secondary | ICD-10-CM | POA: Diagnosis not present

## 2017-12-25 DIAGNOSIS — A0472 Enterocolitis due to Clostridium difficile, not specified as recurrent: Secondary | ICD-10-CM | POA: Diagnosis not present

## 2017-12-25 DIAGNOSIS — E781 Pure hyperglyceridemia: Secondary | ICD-10-CM | POA: Diagnosis not present

## 2017-12-25 DIAGNOSIS — I96 Gangrene, not elsewhere classified: Secondary | ICD-10-CM | POA: Diagnosis not present

## 2017-12-25 DIAGNOSIS — G4739 Other sleep apnea: Secondary | ICD-10-CM | POA: Diagnosis not present

## 2017-12-25 DIAGNOSIS — S88112D Complete traumatic amputation at level between knee and ankle, left lower leg, subsequent encounter: Secondary | ICD-10-CM | POA: Diagnosis not present

## 2017-12-25 DIAGNOSIS — N390 Urinary tract infection, site not specified: Secondary | ICD-10-CM | POA: Diagnosis not present

## 2017-12-25 DIAGNOSIS — E871 Hypo-osmolality and hyponatremia: Secondary | ICD-10-CM | POA: Diagnosis not present

## 2017-12-25 LAB — CBC WITH DIFFERENTIAL/PLATELET
BASOS ABS: 0.1 10*3/uL (ref 0.0–0.1)
Basophils Relative: 0 %
EOS PCT: 3 %
Eosinophils Absolute: 0.5 10*3/uL (ref 0.0–0.7)
HCT: 32.6 % — ABNORMAL LOW (ref 39.0–52.0)
Hemoglobin: 10.4 g/dL — ABNORMAL LOW (ref 13.0–17.0)
LYMPHS PCT: 16 %
Lymphs Abs: 2.6 10*3/uL (ref 0.7–4.0)
MCH: 29.6 pg (ref 26.0–34.0)
MCHC: 31.9 g/dL (ref 30.0–36.0)
MCV: 92.9 fL (ref 78.0–100.0)
MONO ABS: 0.8 10*3/uL (ref 0.1–1.0)
Monocytes Relative: 5 %
Neutro Abs: 11.9 10*3/uL — ABNORMAL HIGH (ref 1.7–7.7)
Neutrophils Relative %: 76 %
Platelets: 364 10*3/uL (ref 150–400)
RBC: 3.51 MIL/uL — ABNORMAL LOW (ref 4.22–5.81)
RDW: 15.1 % (ref 11.5–15.5)
WBC: 15.9 10*3/uL — ABNORMAL HIGH (ref 4.0–10.5)

## 2017-12-25 LAB — COMPREHENSIVE METABOLIC PANEL
ALK PHOS: 82 U/L (ref 38–126)
ALT: 35 U/L (ref 17–63)
ANION GAP: 8 (ref 5–15)
AST: 60 U/L — ABNORMAL HIGH (ref 15–41)
Albumin: 2.6 g/dL — ABNORMAL LOW (ref 3.5–5.0)
BUN: 19 mg/dL (ref 6–20)
CO2: 25 mmol/L (ref 22–32)
CREATININE: 0.79 mg/dL (ref 0.61–1.24)
Calcium: 8.8 mg/dL — ABNORMAL LOW (ref 8.9–10.3)
Chloride: 100 mmol/L — ABNORMAL LOW (ref 101–111)
Glucose, Bld: 115 mg/dL — ABNORMAL HIGH (ref 65–99)
Potassium: 4.7 mmol/L (ref 3.5–5.1)
SODIUM: 133 mmol/L — AB (ref 135–145)
TOTAL PROTEIN: 6.3 g/dL — AB (ref 6.5–8.1)
Total Bilirubin: 0.6 mg/dL (ref 0.3–1.2)

## 2017-12-25 LAB — MAGNESIUM: MAGNESIUM: 1.5 mg/dL — AB (ref 1.7–2.4)

## 2017-12-25 LAB — URINE CULTURE: Culture: >=100000 — AB

## 2017-12-25 LAB — GLUCOSE, CAPILLARY
GLUCOSE-CAPILLARY: 107 mg/dL — AB (ref 65–99)
GLUCOSE-CAPILLARY: 109 mg/dL — AB (ref 65–99)

## 2017-12-25 MED ORDER — VANCOMYCIN 50 MG/ML ORAL SOLUTION: 125.0000 mg | Freq: Four times a day (QID) | ORAL | 0 refills | Status: AC

## 2017-12-25 MED ORDER — CIPROFLOXACIN HCL 500 MG PO TABS: 500.0000 mg | ORAL_TABLET | Freq: Two times a day (BID) | ORAL | 0 refills | Status: DC

## 2017-12-25 MED ORDER — ZINC OXIDE 40 % EX OINT: TOPICAL_OINTMENT | Freq: Three times a day (TID) | CUTANEOUS | 0 refills | Status: DC

## 2017-12-25 NOTE — Progress Notes (Signed)
Pt's IV catheter removed and intact. Pt's IV site clean dry and intact. Report called and given to [facility], [facility]. All questions were answered and no further questions at this time. Pt in stable condition and in no acute distress at time of discharge. Pt will be escorted by nurse tech.

## 2017-12-25 NOTE — Discharge Instructions (Signed)
Follow with Primary MD  [name], MD  and other consultant's as instructed your Hospitalist MD  Please get a complete blood count and chemistry panel checked by your Primary MD at your next visit, and again as instructed by your Primary MD.  Get Medicines reviewed and adjusted: Please take all your medications with you for your next visit with your Primary MD  Laboratory/radiological data: Please request your Primary MD to go over all hospital tests and procedure/radiological results at the follow up, please ask your Primary MD to get all Hospital records sent to his/her office.  In some cases, they will be blood work, cultures and biopsy results pending at the time of your discharge. Please request that your primary care M.D. follows up on these results.  Also Note the following: If you experience worsening of your admission symptoms, develop shortness of breath, life threatening emergency, suicidal or homicidal thoughts you must seek medical attention immediately by calling 911 or calling your MD immediately  if symptoms less severe.  You must read complete instructions/literature along with all the possible adverse reactions/side effects for all the Medicines you take and that have been prescribed to you. Take any new Medicines after you have completely understood and accpet all the possible adverse reactions/side effects.   Do not drive when taking Pain medications or sleeping medications (Benzodaizepines)  Do not take more than prescribed Pain, Sleep and Anxiety Medications. It is not advisable to combine anxiety,sleep and pain medications without talking with your primary care practitioner  Special Instructions: If you have smoked or chewed Tobacco  in the last 2 yrs please stop smoking, stop any regular Alcohol  and or any Recreational drug use.  Wear Seat belts while driving.  Please note: You were cared for by a hospitalist during your hospital stay. Once you are  discharged, your primary care physician will handle any further medical issues. Please note that NO REFILLS for any discharge medications will be authorized once you are discharged, as it is imperative that you return to your primary care physician (or establish a relationship with a primary care physician if you do not have one) for your post hospital discharge needs so that they can reassess your need for medications and monitor your lab values.   Acute Urinary Retention, Male Acute urinary retention is when you are unable to pee (urinate). Acute urinary retention is common in older men. Prostates can get bigger, which blocks the flow of pee. Follow these instructions at home:  Drink enough fluids to keep your pee clear or pale yellow.  If you are sent home with a tube that drains the bladder (catheter), there will be a drainage bag attached to it. There are two types of bags. One is big that you can wear at night without having to empty it. One is smaller and needs to be emptied more often. ? Keep the drainage bag empty. ? Keep the drainage bag lower than your catheter.  Only take medicine as told by your doctor. Contact a doctor if:  You have a low-grade fever.  You have spasms or you are leaking pee when you have spasms. Get help right away if:  You have chills or a fever.  Your catheter stops draining pee.  Your catheter falls out.  You have increased bleeding that does not stop after you have rested and increased the amount of fluids you had been drinking. This information is not intended to replace advice given to you by  your health care provider. Make sure you discuss any questions you have with your health care provider. Document Released: 01/13/2008 Document Revised: 01/02/2016 Document Reviewed: 01/05/2013 Elsevier Interactive Patient Education  2017 [employer]   Indwelling Urinary Catheter Care, Adult Take good care of your catheter to keep it working and to prevent  problems. How to wear your catheter Attach your catheter to your leg with tape (adhesive tape) or a leg strap. Make sure it is not too tight. If you use tape, remove any bits of tape that are already on the catheter. How to wear a drainage bag You should have:  A large overnight bag.  A small leg bag.  Overnight Bag You may wear the overnight bag at any time. Always keep the bag below the level of your bladder but off the floor. When you sleep, put a clean plastic bag in a wastebasket. Then hang the bag inside the wastebasket. Leg Bag Never wear the leg bag at night. Always wear the leg bag below your knee. Keep the leg bag secure with a leg strap or tape. How to care for your skin  Clean the skin around the catheter at least once every day.  Shower every day. Do not take baths.  Put creams, lotions, or ointments on your genital area only as told by your doctor.  Do not use powders, sprays, or lotions on your genital area. How to clean your catheter and your skin 1. Wash your hands with soap and water. 2. Wet a washcloth in warm water and gentle (mild) soap. 3. Use the washcloth to clean the skin where the catheter enters your body. Clean downward and wipe away from the catheter in small circles. Do not wipe toward the catheter. 4. Pat the area dry with a clean towel. Make sure to clean off all soap. How to care for your drainage bags Empty your drainage bag when it is ?- full or at least 2-3 times a day. Replace your drainage bag once a month or sooner if it starts to smell bad or look dirty. Do not clean your drainage bag unless told by your doctor. Emptying a drainage bag  Supplies Needed  Rubbing alcohol.  Gauze pad or cotton ball.  Tape or a leg strap.  Steps 1. Wash your hands with soap and water. 2. Separate (detach) the bag from your leg. 3. Hold the bag over the toilet or a clean container. Keep the bag below your hips and bladder. This stops pee (urine) from  going back into the tube. 4. Open the pour spout at the bottom of the bag. 5. Empty the pee into the toilet or container. Do not let the pour spout touch any surface. 6. Put rubbing alcohol on a gauze pad or cotton ball. 7. Use the gauze pad or cotton ball to clean the pour spout. 8. Close the pour spout. 9. Attach the bag to your leg with tape or a leg strap. 10. Wash your hands.  Changing a drainage bag Supplies Needed  Alcohol wipes.  A clean drainage bag.  Adhesive tape or a leg strap.  Steps 1. Wash your hands with soap and water. 2. Separate the dirty bag from your leg. 3. Pinch the rubber catheter with your fingers so that pee does not spill out. 4. Separate the catheter tube from the drainage tube where these tubes connect (at the connection valve). Do not let the tubes touch any surface. 5. Clean the end of the catheter  tube with an alcohol wipe. Use a different alcohol wipe to clean the end of the drainage tube. 6. Connect the catheter tube to the drainage tube of the clean bag. 7. Attach the new bag to the leg with adhesive tape or a leg strap. 8. Wash your hands.  How to prevent infection and other problems  Never pull on your catheter or try to remove it. Pulling can damage tissue in your body.  Always wash your hands before and after touching your catheter.  If a leg strap gets wet, replace it with a dry one.  Drink enough fluids to keep your pee clear or pale yellow, or as told by your doctor.  Do not let the drainage bag or tubing touch the floor.  Wear cotton underwear.  If you are male, wipe from front to back after you poop (have a bowel movement).  Check on the catheter often to make sure it works and the tubing is not twisted. Get help if:  Your pee is cloudy.  Your pee smells unusually bad.  Your pee is not draining into the bag.  Your tube gets clogged.  Your catheter starts to leak.  Your bladder feels full. Get help right away  if:  You have redness, swelling, or pain where the catheter enters your body.  You have fluid, pus, or a bad smell coming from the area where the catheter enters your body.  The area where the catheter enters your body feels warm.  You have a fever.  You have pain in your: ? Stomach (abdomen). ? Legs. ? Lower back. ? Bladder.  You see blood fill the catheter.  Your pee is pink or red.  You feel sick to your stomach (nauseous).  You throw up (vomit).  You have chills.  Your catheter gets pulled out. This information is not intended to replace advice given to you by your health care provider. Make sure you discuss any questions you have with your health care provider. Document Released: 11/21/2012 Document Revised: 06/24/2016 Document Reviewed: 01/09/2014 Elsevier Interactive Patient Education  2018 [employer].   Urinary Tract Infection, Adult A urinary tract infection (UTI) is an infection of any part of the urinary tract. The urinary tract includes the:  Kidneys.  Ureters.  Bladder.  Urethra.  These organs make, store, and get rid of pee (urine) in the body. Follow these instructions at home:  Take over-the-counter and prescription medicines only as told by your doctor.  If you were prescribed an antibiotic medicine, take it as told by your doctor. Do not stop taking the antibiotic even if you start to feel better.  Avoid the following drinks: ? Alcohol. ? Caffeine. ? Tea. ? Carbonated drinks.  Drink enough fluid to keep your pee clear or pale yellow.  Keep all follow-up visits as told by your doctor. This is important.  Make sure to: ? Empty your bladder often and completely. Do not to hold pee for long periods of time. ? Empty your bladder before and after sex. ? Wipe from front to back after a bowel movement if you are male. Use each tissue one time when you wipe. Contact a doctor if:  You have back pain.  You have a fever.  You feel sick  to your stomach (nauseous).  You throw up (vomit).  Your symptoms do not get better after 3 days.  Your symptoms go away and then come back. Get help right away if:  You have very bad  back pain.  You have very bad lower belly (abdominal) pain.  You are throwing up and cannot keep down any medicines or water. This information is not intended to replace advice given to you by your health care provider. Make sure you discuss any questions you have with your health care provider. Document Released: 01/13/2008 Document Revised: 01/02/2016 Document Reviewed: 06/17/2015 Elsevier Interactive Patient Education  [employer].

## 2017-12-25 NOTE — Progress Notes (Signed)
Pt's family stated that Nitroglycerin (Nitrodur) transdermal patch is to be applied to pt's foot per their MD's order. Pharmacy was contacted about irregular placement of patch and I was informed that there are no contraindications to placing patch on patient's foot per family request. Pt's family states that Nitrodur transdermal patch was not for chest pain but rather circulation. Patch was placed on patient's right foot.

## 2017-12-25 NOTE — Progress Notes (Signed)
Patient will Discharge To:[facility] Anticipated DC Date:12/25/17 Family Notified:yes, Damarius Karnes [phone] Transport By: Harriette Bouillon   Per MD patient ready for DC to [facility] . RN, patient, patient's family, and facility notified of DC. Assessment, Fl2/Pasrr, and Discharge Summary sent to facility. RN given number for report ( Ask for [facility] [phone]). DC packet on chart. Ambulance transport requested for patient.   CSW signing off.  Reed Breech LCSWA [phone]

## 2017-12-25 NOTE — Discharge Summary (Signed)
Physician Discharge Summary  [name] NAT:[id number] DOB: 1928-04-08 DOA: 12/22/2017  PCP: [name], MD  Admit date: 12/22/2017 Discharge date: 12/25/2017  Admitted From: SNF Disposition: SNF  Recommendations for Outpatient Follow-up:  1. Follow up with urologist in 1 week for void trial and follow up urinary retention 2. Please obtain BMP/CBC in one week 3. Please follow up on the following pending results: Final Culture Data 4. Resume wound care per skin care protocols.  5. Continue ciprofloxacin x 5 days, then discontinue  Discharge Condition: STABLE   CODE STATUS: FULL    Brief Hospitalization Summary: Please see all hospital notes, images, labs for full details of the hospitalization.  HPI: Eric [name]. is a 84 y.o. male with medical history significant for coronary artery disease, atrial fibrillation on Eliquis, chronic kidney disease stage II, diet-controlled diabetes mellitus, gangrene of the left foot status post left BKA on 12/15/2017, and recent C. difficile colitis, now presenting from his SNF for evaluation of fever and lethargy.  Patient was discharged from the hospital to SNF yesterday after management of C. difficile colitis.  His diarrhea had essentially resolved and he had been doing much better until today when he was noted to have fever and lethargy.  He denies abdominal pain, but reports nausea without vomiting.  He has a general lethargy, but denies focal numbness or weakness.  He continues oral vancomycin for C. difficile.  Was evaluated by his vascular surgeon 2 days ago and the BKA stump was healing well at that time.  ED Course: Upon arrival to the ED, patient is found to be afebrile, saturating well on room air, and with vitals otherwise normal.  Chest x-ray is negative for acute cardiopulmonary disease, notable for mild cardiomegaly.  CT of the abdomen and pelvis reveals thick-walled irregular appearance to the bladder, but otherwise negative  for acute intra-abdominal or pelvic process.  Chemistry panel is notable for a hyponatremia to 127 and creatinine 1.08, similar to priors.  CBC is notable for leukocytosis to 30,700 and a normocytic anemia with hemoglobin of 9.4.  Lactic acid is reassuring at 1.49.  Patient was given Rocephin in the ED, remains hemodynamically stable, and will be admitted for ongoing evaluation and management of acute UTI.   Brief Admission Hx: Eric Mckayis a 84 y.o.malewith medical history significant forcoronary artery disease, atrial fibrillation on Eliquis, chronic kidney disease stage II, diet-controlled diabetes mellitus, gangrene of the left foot status post left BKA on 12/15/2017, and recent C. difficile colitis, now presenting from his SNF for evaluation of fever and lethargy.  He presented with UTI.   MDM/Assessment & Plan:   1. Pseudomonas UTI - Treated with cefepime with good results and improvement, discharging on oral ciprofloxacin x 5 more days, final culture and sensitivities not back yet, will need to follow up with them outpatient.   2. C. difficile colitis- he is having formed stools now.  Continue oral vancomycin to complete course of treatment for 5 more days, then stop the oral vancomycin.  The CT of the abdomen and pelvis did not show acute inflammation or complications. 3. Hyponatremia-much improved.  Likely secondary to hypovolemia-treated with normal saline.   Discontinued hydrochlorothiazide last week. 4. Normocytic anemia- Hg improved to 10.4.   Suspect this is secondary to chronic disease and recent amputation acute blood loss anemia. 5. Chronic atrial fibrillation-rate is controlled, continue Eliquis for anticoagulation and metoprolol for rate control. 6. Stage II CKD- follow closely with  serial BMP testing.  Avoid nephrotoxins. 7. Postop status post left BKA-patient had left TKA on 12/15/2017 for treatment of left foot gangrene, he did see the surgeon on 5/13 and was doing well.   Continue physical therapy and wound care. 8. Diet-controlled type 2 diabetes mellitus-continue supplemental sliding scale as needed while in the hospital and test blood glucose regularly. 9. BPH with urinary retention-resume Flomax therapy, continue foley cath, follow up with urology outpatient in 1 week for voiding trial foley removal.  DVT prophylaxis: Eliquis Code Status: Full Family Communication: Daughter and wife at bedside Disposition Plan: Discharge to SNF   Discharge Diagnoses:  Principal Problem:   Acute lower UTI Active Problems:   Essential hypertension   Coronary artery disease   CKD (chronic kidney disease), stage II   Persistent atrial fibrillation (HCC)   Gangrene of left foot (HCC)   C. difficile colitis   Hyponatremia   Normocytic anemia   Acute UTI   Pressure injury of skin  Discharge Instructions: Discharge Instructions    Call MD for:  difficulty breathing, headache or visual disturbances   Complete by:  As directed    Call MD for:  extreme fatigue   Complete by:  As directed    Call MD for:  hives   Complete by:  As directed    Call MD for:  persistant dizziness or light-headedness   Complete by:  As directed    Call MD for:  persistant nausea and vomiting   Complete by:  As directed    Call MD for:  severe uncontrolled pain   Complete by:  As directed    Diet - low sodium heart healthy   Complete by:  As directed    Increase activity slowly   Complete by:  As directed      Allergies as of 12/25/2017      Reactions   Isosorbide Mononitrate Other (See Comments)   Unknown, doesn't remember    Ramipril Cough   Quinidine Gluconate Rash      Medication List    STOP taking these medications   oxyCODONE 5 MG immediate release tablet Commonly known as:  Oxy IR/ROXICODONE     TAKE these medications   acetaminophen 325 MG tablet Commonly known as:  TYLENOL Take 2 tablets (650 mg total) by mouth every 6 (six) hours as needed for mild pain (or  temp >/= 101 F).   amLODipine 2.5 MG tablet Commonly known as:  NORVASC Take 1 tablet (2.5 mg total) by mouth daily.   apixaban 2.5 MG Tabs tablet Commonly known as:  ELIQUIS Take 1 tablet (2.5 mg total) by mouth 2 (two) times daily.   B Complex-B12 Tabs Take 1 tablet by mouth daily.   calcium carbonate 500 MG chewable tablet Commonly known as:  TUMS - dosed in mg elemental calcium Chew 1 tablet by mouth daily as needed for indigestion or heartburn.   cholecalciferol 1000 units tablet Commonly known as:  VITAMIN D Take 1,000 Units by mouth 2 (two) times a week. Takes on Mon and Fri   Cinnamon 500 MG capsule Take 1,000 mg by mouth 2 (two) times daily.   ciprofloxacin 500 MG tablet Commonly known as:  CIPRO Take 1 tablet (500 mg total) by mouth 2 (two) times daily for 5 days.   DAILY MULTIVITAMIN PO Take 1 tablet by mouth daily.   fenofibrate 160 MG tablet Take 1 tablet (160 mg total) by mouth daily.   finasteride 5 MG tablet  Commonly known as:  PROSCAR Take 1 tablet (5 mg total) by mouth daily.   fish oil-omega-3 fatty acids 1000 MG capsule Take 1,000 mg by mouth daily.   glucose blood test strip Use as instructed   lisinopril 20 MG tablet Commonly known as:  PRINIVIL,ZESTRIL Take 1 tablet (20 mg total) by mouth daily.   liver oil-zinc oxide 40 % ointment Commonly known as:  DESITIN Apply topically 3 (three) times daily between meals.   metoprolol tartrate 25 MG tablet Commonly known as:  LOPRESSOR Take 0.5 tablets (12.5 mg total) by mouth 2 (two) times daily.   nitroGLYCERIN 0.2 mg/hr patch Commonly known as:  NITRODUR - Dosed in mg/24 hr Place 1 patch (0.2 mg total) onto the skin daily.   nutrition supplement (JUVEN) Pack Take 1 packet by mouth 2 (two) times daily between meals.   ondansetron 4 MG tablet Commonly known as:  ZOFRAN Take 4 mg by mouth every 6 (six) hours as needed for nausea or vomiting.   ONE TOUCH ULTRA SYSTEM KIT w/Device Kit 1  kit by Does not apply route once.   ONETOUCH DELICA LANCETS FINE Misc check sugar once daily   pentoxifylline 400 MG CR tablet Commonly known as:  TRENTAL Take 1 tablet (400 mg total) by mouth 3 (three) times daily with meals.   rosuvastatin 40 MG tablet Commonly known as:  CRESTOR Take 1 tablet (40 mg total) by mouth daily.   tamsulosin 0.4 MG Caps capsule Commonly known as:  FLOMAX Take 1 capsule (0.4 mg total) by mouth daily.   vancomycin 50 mg/mL oral solution Commonly known as:  VANCOCIN Take 2.5 mLs (125 mg total) by mouth every 6 (six) hours for 5 days.   VENELEX Oint Apply 1 application topically 3 (three) times daily. Applied at every shift day, evening, and night to bilateral buttocks/sacrum area      Follow-up [place]. Schedule an appointment as soon as possible for a visit in 1 week(s).   Why:  Follow up urinary retention, foley catheter care Contact information: [address], [address] [place] [address] [phone]         Allergies  Allergen Reactions  . Isosorbide Mononitrate Other (See Comments)    Unknown, doesn't remember   . Ramipril Cough  . Quinidine Gluconate Rash   Allergies as of 12/25/2017      Reactions   Isosorbide Mononitrate Other (See Comments)   Unknown, doesn't remember    Ramipril Cough   Quinidine Gluconate Rash      Medication List    STOP taking these medications   oxyCODONE 5 MG immediate release tablet Commonly known as:  Oxy IR/ROXICODONE     TAKE these medications   acetaminophen 325 MG tablet Commonly known as:  TYLENOL Take 2 tablets (650 mg total) by mouth every 6 (six) hours as needed for mild pain (or temp >/= 101 F).   amLODipine 2.5 MG tablet Commonly known as:  NORVASC Take 1 tablet (2.5 mg total) by mouth daily.   apixaban 2.5 MG Tabs tablet Commonly known as:  ELIQUIS Take 1 tablet (2.5 mg total) by mouth 2 (two) times daily.   B Complex-B12  Tabs Take 1 tablet by mouth daily.   calcium carbonate 500 MG chewable tablet Commonly known as:  TUMS - dosed in mg elemental calcium Chew 1 tablet by mouth daily as needed for indigestion or heartburn.   cholecalciferol 1000 units tablet Commonly  known as:  VITAMIN D Take 1,000 Units by mouth 2 (two) times a week. Takes on Mon and Fri   Cinnamon 500 MG capsule Take 1,000 mg by mouth 2 (two) times daily.   ciprofloxacin 500 MG tablet Commonly known as:  CIPRO Take 1 tablet (500 mg total) by mouth 2 (two) times daily for 5 days.   DAILY MULTIVITAMIN PO Take 1 tablet by mouth daily.   fenofibrate 160 MG tablet Take 1 tablet (160 mg total) by mouth daily.   finasteride 5 MG tablet Commonly known as:  PROSCAR Take 1 tablet (5 mg total) by mouth daily.   fish oil-omega-3 fatty acids 1000 MG capsule Take 1,000 mg by mouth daily.   glucose blood test strip Use as instructed   lisinopril 20 MG tablet Commonly known as:  PRINIVIL,ZESTRIL Take 1 tablet (20 mg total) by mouth daily.   liver oil-zinc oxide 40 % ointment Commonly known as:  DESITIN Apply topically 3 (three) times daily between meals.   metoprolol tartrate 25 MG tablet Commonly known as:  LOPRESSOR Take 0.5 tablets (12.5 mg total) by mouth 2 (two) times daily.   nitroGLYCERIN 0.2 mg/hr patch Commonly known as:  NITRODUR - Dosed in mg/24 hr Place 1 patch (0.2 mg total) onto the skin daily.   nutrition supplement (JUVEN) Pack Take 1 packet by mouth 2 (two) times daily between meals.   ondansetron 4 MG tablet Commonly known as:  ZOFRAN Take 4 mg by mouth every 6 (six) hours as needed for nausea or vomiting.   ONE TOUCH ULTRA SYSTEM KIT w/Device Kit 1 kit by Does not apply route once.   ONETOUCH DELICA LANCETS FINE Misc check sugar once daily   pentoxifylline 400 MG CR tablet Commonly known as:  TRENTAL Take 1 tablet (400 mg total) by mouth 3 (three) times daily with meals.   rosuvastatin 40 MG  tablet Commonly known as:  CRESTOR Take 1 tablet (40 mg total) by mouth daily.   tamsulosin 0.4 MG Caps capsule Commonly known as:  FLOMAX Take 1 capsule (0.4 mg total) by mouth daily.   vancomycin 50 mg/mL oral solution Commonly known as:  VANCOCIN Take 2.5 mLs (125 mg total) by mouth every 6 (six) hours for 5 days.   VENELEX Oint Apply 1 application topically 3 (three) times daily. Applied at every shift day, evening, and night to bilateral buttocks/sacrum area       Procedures/Studies: Ct Abdomen Pelvis Wo Contrast  Result Date: 12/22/2017 CLINICAL DATA:  Fever and elevated white count EXAM: CT ABDOMEN AND PELVIS WITHOUT CONTRAST TECHNIQUE: Multidetector CT imaging of the abdomen and pelvis was performed following the standard protocol without IV contrast. COMPARISON:  04/21/2016 FINDINGS: Lower chest: Lung bases demonstrate no acute consolidation or pleural effusion. Lower sternotomy changes. Cardiomegaly with coronary vascular calcification. Hepatobiliary: Cyst in the left hepatic lobe. Additional subcentimeter hypodensities in the right lobe too small to further characterize. Calcified stone in the gallbladder. No biliary dilatation. Pancreas: Unremarkable. No pancreatic ductal dilatation or surrounding inflammatory changes. Spleen: Normal in size without focal abnormality. Adrenals/Urinary Tract: Adrenal glands are within normal limits. No hydronephrosis. Air in the bladder. Foley catheter in the bladder. Thick-walled appearance of the bladder with irregular margin. Stomach/Bowel: Stomach is within normal limits. Appendix appears normal. No evidence of bowel wall thickening, distention, or inflammatory changes. Sigmoid and descending colon diverticular disease without acute inflammation Vascular/Lymphatic: Moderate aortic atherosclerosis. No aneurysmal dilatation. No significantly enlarged lymph nodes Reproductive: Enlarged prostate gland Other:  Negative for free air or free fluid.  Musculoskeletal: Degenerative changes. No acute or suspicious abnormality. IMPRESSION: 1. Thick wall irregular appearance of the bladder, which may be secondary to cystitis or chronic obstruction. Bladder is decompressed by Foley catheter. 2. Otherwise no CT evidence for acute intra-abdominal or pelvic abnormality. 3. Gallstone 4. Diverticular disease of the colon without acute inflammation 5. Cardiomegaly Electronically Signed   By: Donavan Foil M.D.   On: 12/22/2017 22:12   Dg Chest 2 View  Result Date: 12/22/2017 CLINICAL DATA:  Fever with weakness and nausea EXAM: CHEST - 2 VIEW COMPARISON:  08/26/2010 FINDINGS: Post sternotomy changes. Mild hyperinflation. No focal airspace disease or pleural effusion. Mild cardiomegaly. No pneumothorax. Mild degenerative changes of the spine. IMPRESSION: No active cardiopulmonary disease.  Mild cardiomegaly. Electronically Signed   By: Donavan Foil M.D.   On: 12/22/2017 20:05      Subjective: Pt says that he feels much better and feels back to his baseline.    Discharge Exam: Vitals:   12/24/17 2235 12/25/17 0658  BP: 140/69 (!) 166/84  Pulse: 74 66  Resp: 15 20  Temp: 98.8 F (37.1 C) (!) 97.5 F (36.4 C)  SpO2: 99% 100%   Vitals:   12/24/17 1458 12/24/17 2235 12/25/17 0500 12/25/17 0658  BP: 139/62 140/69  (!) 166/84  Pulse: 71 74  66  Resp: _0 Temp: 98.6 F (37 C) 98.8 F (37.1 C)  (!) 97.5 F (36.4 C)  TempSrc: Oral Oral  Oral  SpO2: 98% 99%  100%  Weight:   73.4 kg (161 lb 13.1 oz)   Height:        General exam: Chronically ill-appearing elderly male awake alert in no apparent distress cooperative. Respiratory system: Clear. No increased work of breathing. Cardiovascular system: Normal S1 & S2 heard. No JVD, murmurs, gallops, clicks or pedal edema. Gastrointestinal system: Abdomen is nondistended, soft and nontender. Normal bowel sounds heard. Central nervous system: Alert and oriented. No focal neurological  deficits. Extremities: Left BKA wound healing well.   The results of significant diagnostics from this hospitalization (including imaging, microbiology, ancillary and laboratory) are listed below for reference.     Microbiology: Recent Results (from the past 240 hour(s))  Culture, Urine     Status: Abnormal (Preliminary result)   Collection Time: 12/22/17  5:50 PM  Result Value Ref Range Status   Specimen Description   Final    URINE, CATHETERIZED Performed at [facility], [address] [place], [place] [address]    Special Requests   Final    NONE Performed at [facility], [address], [place], [place] [address]    Culture >=100,000 COLONIES/mL PSEUDOMONAS AERUGINOSA (A)  Final   Report Status PENDING  Incomplete  MRSA PCR Screening     Status: None   Collection Time: 12/23/17  4:33 AM  Result Value Ref Range Status   MRSA by PCR NEGATIVE NEGATIVE Final    Comment:        The GeneXpert MRSA Assay (FDA approved for NASAL specimens only), is one component of a comprehensive MRSA colonization surveillance program. It is not intended to diagnose MRSA infection nor to guide or monitor treatment for MRSA infections. Performed at [facility], [address] [place], [place] [address]      Labs: BNP (last 3 results) No results for input(s): BNP in the last 8760 hours. Basic Metabolic Panel: Recent Labs  Lab 12/19/17 0606 12/22/17 1750 12/22/17 2035 12/23/17  0700 12/24/17 0529 12/25/17 0715  NA 135 126* 127* 130* 130* 133*  K 3.9 4.2 4.1 3.9 4.3 4.7  CL 106 96* 95* 97* 101 100*  CO2 _0 GLUCOSE 109* 263* 177* 149* 180* 115*  BUN 21* 24* 24* 20 21* 19  CREATININE 1.03 1.00 1.08 0.96 0.88 0.79  CALCIUM 8.3* 8.4* 8.5* 8.4* 8.2* 8.8*  MG 2.0  --   --   --  1.4* 1.5*   Liver Function Tests: Recent Labs  Lab 12/22/17 1750 12/22/17 2035 12/24/17 0529 12/25/17 0715  AST 54* 53* 42* 60*  ALT 31 32 27 35  ALKPHOS 71 76 69 82  BILITOT 0.6 0.9  0.5 0.6  PROT 5.6* 6.0* 5.4* 6.3*  ALBUMIN 2.3* 2.5* 2.1* 2.6*   No results for input(s): LIPASE, AMYLASE in the last 168 hours. No results for input(s): AMMONIA in the last 168 hours. CBC: Recent Labs  Lab 12/22/17 1750 12/22/17 2035 12/23/17 0700 12/24/17 0529 12/25/17 0715  WBC 29.3* 30.7* 28.1* 19.4* 15.9*  NEUTROABS 24.4 24.9* 24.5* 15.8* 11.9*  HGB 9.2* 9.4* 9.3* 8.7* 10.4*  HCT 27.9* 28.3* 27.6* 26.5* 32.6*  MCV 91.2 92.2 91.4 92.7 92.9  PLT 304 315 306 303 364   Cardiac Enzymes: No results for input(s): CKTOTAL, CKMB, CKMBINDEX, TROPONINI in the last 168 hours. BNP: Invalid input(s): POCBNP CBG: Recent Labs  Lab 12/24/17 0731 12/24/17 1127 12/24/17 1709 12/24/17 2150 12/25/17 0800  GLUCAP 118* 130* 156* 140* 107*   D-Dimer No results for input(s): DDIMER in the last 72 hours. Hgb A1c No results for input(s): HGBA1C in the last 72 hours. Lipid Profile No results for input(s): CHOL, HDL, LDLCALC, TRIG, CHOLHDL, LDLDIRECT in the last 72 hours. Thyroid function studies No results for input(s): TSH, T4TOTAL, T3FREE, THYROIDAB in the last 72 hours.  Invalid input(s): FREET3 Anemia work up No results for input(s): VITAMINB12, FOLATE, FERRITIN, TIBC, IRON, RETICCTPCT in the last 72 hours. Urinalysis    Component Value Date/Time   COLORURINE YELLOW 12/22/2017 1750   APPEARANCEUR CLOUDY (A) 12/22/2017 1750   LABSPEC 1.015 12/22/2017 1750   PHURINE 5.0 12/22/2017 1750   GLUCOSEU >=500 (A) 12/22/2017 1750   HGBUR LARGE (A) 12/22/2017 1750   HGBUR trace-lysed 02/07/2008 1101   BILIRUBINUR NEGATIVE 12/22/2017 1750   KETONESUR NEGATIVE 12/22/2017 1750   PROTEINUR 100 (A) 12/22/2017 1750   UROBILINOGEN 0.2 02/07/2008 1101   NITRITE NEGATIVE 12/22/2017 1750   LEUKOCYTESUR LARGE (A) 12/22/2017 1750   Sepsis Labs Invalid input(s): PROCALCITONIN,  WBC,  LACTICIDVEN Microbiology Recent Results (from the past 240 hour(s))  Culture, Urine     Status: Abnormal  (Preliminary result)   Collection Time: 12/22/17  5:50 PM  Result Value Ref Range Status   Specimen Description   Final    URINE, CATHETERIZED Performed at [facility], [address], [place], [place] [address]    Special Requests   Final    NONE Performed at [facility], [address] [place], [place] [address]    Culture >=100,000 COLONIES/mL PSEUDOMONAS AERUGINOSA (A)  Final   Report Status PENDING  Incomplete  MRSA PCR Screening     Status: None   Collection Time: 12/23/17  4:33 AM  Result Value Ref Range Status   MRSA by PCR NEGATIVE NEGATIVE Final    Comment:        The GeneXpert MRSA Assay (FDA approved for NASAL specimens only), is one component of a comprehensive MRSA colonization  surveillance program. It is not intended to diagnose MRSA infection nor to guide or monitor treatment for MRSA infections. Performed at [facility], [address] [place], [place] [address]    Time coordinating discharge: 34 minutes  SIGNED:  Irwin Brakeman, MD  [facility] 12/25/2017, 9:33 AM Pager [phone]  If 7PM-7AM, please contact night-coverage [email or web address] Password [email or web address]

## 2017-12-26 ENCOUNTER — Inpatient Hospital Stay (HOSPITAL_COMMUNITY)
Admission: EM | Admit: 2017-12-26 | Discharge: 2017-12-28 | DRG: 683 | Disposition: A | Discharge status: Skilled Nursing Facility | Payer: PPO | Attending: Internal Medicine | Admitting: Internal Medicine

## 2017-12-26 ENCOUNTER — Other Ambulatory Visit: Payer: Self-pay

## 2017-12-26 ENCOUNTER — Emergency Department (HOSPITAL_COMMUNITY): Payer: PPO

## 2017-12-26 ENCOUNTER — Encounter (HOSPITAL_COMMUNITY): Payer: Self-pay | Admitting: Emergency Medicine

## 2017-12-26 DIAGNOSIS — T83511D Infection and inflammatory reaction due to indwelling urethral catheter, subsequent encounter: Secondary | ICD-10-CM

## 2017-12-26 DIAGNOSIS — B965 Pseudomonas (aeruginosa) (mallei) (pseudomallei) as the cause of diseases classified elsewhere: Secondary | ICD-10-CM | POA: Diagnosis present

## 2017-12-26 DIAGNOSIS — I481 Persistent atrial fibrillation: Secondary | ICD-10-CM | POA: Diagnosis not present

## 2017-12-26 DIAGNOSIS — E1122 Type 2 diabetes mellitus with diabetic chronic kidney disease: Secondary | ICD-10-CM | POA: Diagnosis not present

## 2017-12-26 DIAGNOSIS — Z9861 Coronary angioplasty status: Secondary | ICD-10-CM | POA: Diagnosis not present

## 2017-12-26 DIAGNOSIS — N179 Acute kidney failure, unspecified: Secondary | ICD-10-CM | POA: Diagnosis not present

## 2017-12-26 DIAGNOSIS — I1310 Hypertensive heart and chronic kidney disease without heart failure, with stage 1 through stage 4 chronic kidney disease, or unspecified chronic kidney disease: Secondary | ICD-10-CM | POA: Diagnosis not present

## 2017-12-26 DIAGNOSIS — E781 Pure hyperglyceridemia: Secondary | ICD-10-CM | POA: Diagnosis present

## 2017-12-26 DIAGNOSIS — T368X5A Adverse effect of other systemic antibiotics, initial encounter: Secondary | ICD-10-CM | POA: Diagnosis not present

## 2017-12-26 DIAGNOSIS — Z974 Presence of external hearing-aid: Secondary | ICD-10-CM | POA: Diagnosis not present

## 2017-12-26 DIAGNOSIS — R339 Retention of urine, unspecified: Secondary | ICD-10-CM | POA: Diagnosis not present

## 2017-12-26 DIAGNOSIS — Z4781 Encounter for orthopedic aftercare following surgical amputation: Secondary | ICD-10-CM | POA: Diagnosis not present

## 2017-12-26 DIAGNOSIS — R279 Unspecified lack of coordination: Secondary | ICD-10-CM | POA: Diagnosis not present

## 2017-12-26 DIAGNOSIS — I4819 Other persistent atrial fibrillation: Secondary | ICD-10-CM

## 2017-12-26 DIAGNOSIS — D631 Anemia in chronic kidney disease: Secondary | ICD-10-CM | POA: Diagnosis not present

## 2017-12-26 DIAGNOSIS — I4891 Unspecified atrial fibrillation: Secondary | ICD-10-CM | POA: Diagnosis not present

## 2017-12-26 DIAGNOSIS — R262 Difficulty in walking, not elsewhere classified: Secondary | ICD-10-CM | POA: Diagnosis not present

## 2017-12-26 DIAGNOSIS — Z89512 Acquired absence of left leg below knee: Secondary | ICD-10-CM | POA: Diagnosis not present

## 2017-12-26 DIAGNOSIS — I2510 Atherosclerotic heart disease of native coronary artery without angina pectoris: Secondary | ICD-10-CM | POA: Diagnosis not present

## 2017-12-26 DIAGNOSIS — I739 Peripheral vascular disease, unspecified: Secondary | ICD-10-CM | POA: Diagnosis not present

## 2017-12-26 DIAGNOSIS — G4739 Other sleep apnea: Secondary | ICD-10-CM | POA: Diagnosis not present

## 2017-12-26 DIAGNOSIS — N401 Enlarged prostate with lower urinary tract symptoms: Secondary | ICD-10-CM | POA: Diagnosis not present

## 2017-12-26 DIAGNOSIS — G4733 Obstructive sleep apnea (adult) (pediatric): Secondary | ICD-10-CM | POA: Diagnosis present

## 2017-12-26 DIAGNOSIS — Z87891 Personal history of nicotine dependence: Secondary | ICD-10-CM

## 2017-12-26 DIAGNOSIS — E1165 Type 2 diabetes mellitus with hyperglycemia: Secondary | ICD-10-CM | POA: Diagnosis not present

## 2017-12-26 DIAGNOSIS — I10 Essential (primary) hypertension: Secondary | ICD-10-CM | POA: Diagnosis present

## 2017-12-26 DIAGNOSIS — K858 Other acute pancreatitis without necrosis or infection: Secondary | ICD-10-CM

## 2017-12-26 DIAGNOSIS — G9009 Other idiopathic peripheral autonomic neuropathy: Secondary | ICD-10-CM | POA: Diagnosis not present

## 2017-12-26 DIAGNOSIS — E1152 Type 2 diabetes mellitus with diabetic peripheral angiopathy with gangrene: Secondary | ICD-10-CM | POA: Diagnosis not present

## 2017-12-26 DIAGNOSIS — R112 Nausea with vomiting, unspecified: Secondary | ICD-10-CM

## 2017-12-26 DIAGNOSIS — Z8249 Family history of ischemic heart disease and other diseases of the circulatory system: Secondary | ICD-10-CM

## 2017-12-26 DIAGNOSIS — A0472 Enterocolitis due to Clostridium difficile, not specified as recurrent: Secondary | ICD-10-CM | POA: Diagnosis not present

## 2017-12-26 DIAGNOSIS — N183 Chronic kidney disease, stage 3 (moderate): Secondary | ICD-10-CM | POA: Diagnosis not present

## 2017-12-26 DIAGNOSIS — I4892 Unspecified atrial flutter: Secondary | ICD-10-CM | POA: Diagnosis present

## 2017-12-26 DIAGNOSIS — N390 Urinary tract infection, site not specified: Secondary | ICD-10-CM | POA: Diagnosis not present

## 2017-12-26 DIAGNOSIS — Z7901 Long term (current) use of anticoagulants: Secondary | ICD-10-CM

## 2017-12-26 DIAGNOSIS — R1111 Vomiting without nausea: Secondary | ICD-10-CM | POA: Diagnosis not present

## 2017-12-26 DIAGNOSIS — E871 Hypo-osmolality and hyponatremia: Secondary | ICD-10-CM

## 2017-12-26 DIAGNOSIS — E1151 Type 2 diabetes mellitus with diabetic peripheral angiopathy without gangrene: Secondary | ICD-10-CM | POA: Diagnosis not present

## 2017-12-26 DIAGNOSIS — S88112D Complete traumatic amputation at level between knee and ankle, left lower leg, subsequent encounter: Secondary | ICD-10-CM | POA: Diagnosis not present

## 2017-12-26 DIAGNOSIS — D649 Anemia, unspecified: Secondary | ICD-10-CM | POA: Diagnosis not present

## 2017-12-26 DIAGNOSIS — R001 Bradycardia, unspecified: Secondary | ICD-10-CM | POA: Diagnosis not present

## 2017-12-26 LAB — URINALYSIS, ROUTINE W REFLEX MICROSCOPIC
Bilirubin Urine: NEGATIVE
GLUCOSE, UA: NEGATIVE mg/dL
KETONES UR: NEGATIVE mg/dL
Nitrite: NEGATIVE
PROTEIN: 100 mg/dL — AB
RBC / HPF: >50 RBC/hpf — ABNORMAL HIGH (ref 0–5)
Specific Gravity, Urine: 1.016 (ref 1.005–1.030)
WBC, UA: >50 WBC/hpf — ABNORMAL HIGH (ref 0–5)
pH: 5 (ref 5.0–8.0)

## 2017-12-26 LAB — COMPREHENSIVE METABOLIC PANEL
ALBUMIN: 2.6 g/dL — AB (ref 3.5–5.0)
ALT: 44 U/L (ref 17–63)
AST: 70 U/L — AB (ref 15–41)
Alkaline Phosphatase: 98 U/L (ref 38–126)
Anion gap: 8 (ref 5–15)
BUN: 36 mg/dL — AB (ref 6–20)
CALCIUM: 9.1 mg/dL (ref 8.9–10.3)
CO2: 26 mmol/L (ref 22–32)
Chloride: 95 mmol/L — ABNORMAL LOW (ref 101–111)
Creatinine, Ser: 1.36 mg/dL — ABNORMAL HIGH (ref 0.61–1.24)
GFR calc Af Amer: 52 mL/min — ABNORMAL LOW (ref >60)
GFR calc non Af Amer: 45 mL/min — ABNORMAL LOW (ref >60)
GLUCOSE: 211 mg/dL — AB (ref 65–99)
Potassium: 4.7 mmol/L (ref 3.5–5.1)
SODIUM: 129 mmol/L — AB (ref 135–145)
TOTAL PROTEIN: 6.2 g/dL — AB (ref 6.5–8.1)
Total Bilirubin: 0.6 mg/dL (ref 0.3–1.2)

## 2017-12-26 LAB — CBC WITH DIFFERENTIAL/PLATELET
BASOS PCT: 1 %
Basophils Absolute: 0.1 10*3/uL (ref 0.0–0.1)
EOS ABS: 0.2 10*3/uL (ref 0.0–0.7)
EOS PCT: 1 %
HCT: 30.2 % — ABNORMAL LOW (ref 39.0–52.0)
HEMOGLOBIN: 9.9 g/dL — AB (ref 13.0–17.0)
LYMPHS ABS: 2 10*3/uL (ref 0.7–4.0)
Lymphocytes Relative: 13 %
MCH: 30.1 pg (ref 26.0–34.0)
MCHC: 32.8 g/dL (ref 30.0–36.0)
MCV: 91.8 fL (ref 78.0–100.0)
MONOS PCT: 6 %
Monocytes Absolute: 0.9 10*3/uL (ref 0.1–1.0)
NEUTROS PCT: 79 %
Neutro Abs: 12.9 10*3/uL — ABNORMAL HIGH (ref 1.7–7.7)
PLATELETS: 345 10*3/uL (ref 150–400)
RBC: 3.29 MIL/uL — ABNORMAL LOW (ref 4.22–5.81)
RDW: 14.7 % (ref 11.5–15.5)
WBC: 16 10*3/uL — AB (ref 4.0–10.5)

## 2017-12-26 LAB — LACTIC ACID, PLASMA: Lactic Acid, Venous: 2 mmol/L (ref 0.5–1.9)

## 2017-12-26 LAB — TROPONIN I

## 2017-12-26 LAB — LIPASE, BLOOD: Lipase: 66 U/L — ABNORMAL HIGH (ref 11–51)

## 2017-12-26 MED ORDER — ONDANSETRON HCL 4 MG/2ML IJ SOLN: 4.0000 mg | Freq: Four times a day (QID) | INTRAMUSCULAR | Status: DC | PRN

## 2017-12-26 MED ORDER — PENTOXIFYLLINE ER 400 MG PO TBCR
400.0000 mg | EXTENDED_RELEASE_TABLET | Freq: Three times a day (TID) | ORAL | Status: DC
  Administered 2017-12-27 – 2017-12-28 (×6): 400 mg via ORAL
  Filled 2017-12-26 (×7): qty 1

## 2017-12-26 MED ORDER — TAMSULOSIN HCL 0.4 MG PO CAPS
0.4000 mg | ORAL_CAPSULE | Freq: Every day | ORAL | Status: DC
  Administered 2017-12-27 – 2017-12-28 (×2): 0.4 mg via ORAL
  Filled 2017-12-26 (×2): qty 1

## 2017-12-26 MED ORDER — JUVEN PO PACK
1.0000 | PACK | Freq: Two times a day (BID) | ORAL | Status: DC
  Administered 2017-12-27 – 2017-12-28 (×2): 1 via ORAL
  Filled 2017-12-26 (×2): qty 1

## 2017-12-26 MED ORDER — SODIUM CHLORIDE 0.9 % IV SOLN
2.0000 g | Freq: Two times a day (BID) | INTRAVENOUS | Status: DC
  Administered 2017-12-27: 2 g via INTRAVENOUS
  Filled 2017-12-26 (×4): qty 2

## 2017-12-26 MED ORDER — SODIUM CHLORIDE 0.9 % IV BOLUS
500.0000 mL | Freq: Once | INTRAVENOUS | Status: AC
  Administered 2017-12-26: 500 mL via INTRAVENOUS

## 2017-12-26 MED ORDER — ROSUVASTATIN CALCIUM 20 MG PO TABS
40.0000 mg | ORAL_TABLET | Freq: Every day | ORAL | Status: DC
  Administered 2017-12-27 – 2017-12-28 (×2): 40 mg via ORAL
  Filled 2017-12-26 (×2): qty 1
  Filled 2017-12-26 (×3): qty 2

## 2017-12-26 MED ORDER — FAMOTIDINE IN NACL 20-0.9 MG/50ML-% IV SOLN
20.0000 mg | Freq: Once | INTRAVENOUS | Status: AC
  Administered 2017-12-26: 20 mg via INTRAVENOUS
  Filled 2017-12-26: qty 50

## 2017-12-26 MED ORDER — HYDROCODONE-ACETAMINOPHEN 5-325 MG PO TABS
1.0000 | ORAL_TABLET | ORAL | Status: DC | PRN
  Administered 2017-12-27: 2 via ORAL
  Filled 2017-12-26 (×2): qty 1

## 2017-12-26 MED ORDER — VANCOMYCIN 50 MG/ML ORAL SOLUTION
125.0000 mg | Freq: Four times a day (QID) | ORAL | Status: DC
  Administered 2017-12-27 – 2017-12-28 (×6): 125 mg via ORAL
  Filled 2017-12-26 (×13): qty 2.5

## 2017-12-26 MED ORDER — B COMPLEX-C PO TABS
1.0000 | ORAL_TABLET | Freq: Every day | ORAL | Status: DC
  Administered 2017-12-27 – 2017-12-28 (×2): 1 via ORAL
  Filled 2017-12-26 (×2): qty 1

## 2017-12-26 MED ORDER — FINASTERIDE 5 MG PO TABS
5.0000 mg | ORAL_TABLET | Freq: Every day | ORAL | Status: DC
Start: 2017-12-27 — End: 2017-12-28
  Administered 2017-12-27 – 2017-12-28 (×2): 5 mg via ORAL
  Filled 2017-12-26 (×3): qty 1

## 2017-12-26 MED ORDER — ONDANSETRON HCL 4 MG PO TABS: 4.0000 mg | ORAL_TABLET | Freq: Four times a day (QID) | ORAL | Status: DC | PRN

## 2017-12-26 MED ORDER — SODIUM CHLORIDE 0.9 % IV SOLN
INTRAVENOUS | Status: AC
  Administered 2017-12-27: 03:00:00 via INTRAVENOUS

## 2017-12-26 MED ORDER — SODIUM CHLORIDE 0.9 % IV SOLN
INTRAVENOUS | Status: DC
  Administered 2017-12-26: 21:00:00 via INTRAVENOUS

## 2017-12-26 MED ORDER — ACETAMINOPHEN 650 MG RE SUPP
650.0000 mg | Freq: Four times a day (QID) | RECTAL | Status: DC | PRN
Start: 2017-12-26 — End: 2017-12-28

## 2017-12-26 MED ORDER — APIXABAN 2.5 MG PO TABS
2.5000 mg | ORAL_TABLET | Freq: Two times a day (BID) | ORAL | Status: DC
  Administered 2017-12-27 – 2017-12-28 (×4): 2.5 mg via ORAL
  Filled 2017-12-26 (×6): qty 1

## 2017-12-26 MED ORDER — ONDANSETRON HCL 4 MG/2ML IJ SOLN: 4.0000 mg | INTRAMUSCULAR | Status: DC | PRN

## 2017-12-26 MED ORDER — VITAMIN D 1000 UNITS PO TABS
1000.0000 [IU] | ORAL_TABLET | ORAL | Status: DC
  Administered 2017-12-27: 1000 [IU] via ORAL
  Filled 2017-12-26: qty 1

## 2017-12-26 MED ORDER — INSULIN ASPART 100 UNIT/ML ~~LOC~~ SOLN: 0.0000 [IU] | Freq: Every day | SUBCUTANEOUS | Status: DC

## 2017-12-26 MED ORDER — INSULIN ASPART 100 UNIT/ML ~~LOC~~ SOLN
0.0000 [IU] | Freq: Three times a day (TID) | SUBCUTANEOUS | Status: DC
  Administered 2017-12-28: 1 [IU] via SUBCUTANEOUS

## 2017-12-26 MED ORDER — METOPROLOL TARTRATE 25 MG PO TABS
12.5000 mg | ORAL_TABLET | Freq: Two times a day (BID) | ORAL | Status: DC
  Administered 2017-12-27 – 2017-12-28 (×4): 12.5 mg via ORAL
  Filled 2017-12-26 (×4): qty 1

## 2017-12-26 MED ORDER — ACETAMINOPHEN 325 MG PO TABS
650.0000 mg | ORAL_TABLET | Freq: Four times a day (QID) | ORAL | Status: DC | PRN
Start: 2017-12-26 — End: 2017-12-28

## 2017-12-26 MED ORDER — ZINC OXIDE 40 % EX OINT
TOPICAL_OINTMENT | Freq: Three times a day (TID) | CUTANEOUS | Status: DC
  Administered 2017-12-27 (×3): via TOPICAL
  Filled 2017-12-26: qty 113

## 2017-12-26 MED ORDER — OMEGA-3-ACID ETHYL ESTERS 1 G PO CAPS
1000.0000 mg | ORAL_CAPSULE | Freq: Every day | ORAL | Status: DC
  Administered 2017-12-27 – 2017-12-28 (×2): 1000 mg via ORAL
  Filled 2017-12-26 (×2): qty 1

## 2017-12-26 MED ORDER — AMLODIPINE BESYLATE 5 MG PO TABS
2.5000 mg | ORAL_TABLET | Freq: Every day | ORAL | Status: DC
  Administered 2017-12-27 – 2017-12-28 (×2): 2.5 mg via ORAL
  Filled 2017-12-26 (×2): qty 1

## 2017-12-26 NOTE — H&P (Signed)
History and Physical    Eric [name]. GQQ:[id number] DOB: 02/26/1928 DOA: 12/26/2017  PCP: [name], MD   Patient coming from: SNF   Chief Complaint: Nausea and vomiting   HPI: Eric [name]. is a 84 y.o. male with medical history significant for persistent atrial fibrillation on Eliquis, coronary artery disease, diet-controlled diabetes mellitus, recent left BKA, recent C. difficile colitis with diarrhea resolved, and recent pseudomonal UTI, discharged to SNF yesterday, now returning with nausea and vomiting.  Patient was discharged from the hospital yesterday after treatment for UTI with culture growing Pseudomonas.  He had been treated with cefepime in the hospital beginning 12/22/2017 and was discharged yesterday with ciprofloxacin.  He had an uneventful morning before developing acute nausea with nonbloody vomiting this afternoon.  He denies fevers, chills, or abdominal pain.  He has not had any diarrhea in several days.  There has been several episodes of vomiting and he was unable to tolerate his oral medications at the SNF.  ED Course: Upon arrival to the ED, patient is found to be afebrile, saturating well on room air, and with vitals otherwise stable.  EKG features atrial fibrillation and chest x-ray is negative for acute cardiopulmonary disease.  Chemistry panel is notable for sodium of 129, glucose 211, and creatinine of 1.36, up from 0.79 yesterday.  CBC is notable for stable leukocytosis at 16,000 and a stable normocytic anemia with hemoglobin 9.9.  Lactic acid is 2.0, troponin is undetectable, and lipase is 66.  CT of the abdomen and pelvis features possible minimal stranding about the pancreatic head.  Patient was given 500 cc normal saline, Pepcid, and Zofran in the ED.  He remains hemodynamically stable, in no apparent respiratory distress, and will be admitted for ongoing evaluation and management of nausea and vomiting with acute kidney injury.  Review of Systems:   All other systems reviewed and apart from HPI, are negative.  Past Medical History:  Diagnosis Date  . Atrial flutter ([place])    s/p RFCA  . CAD (coronary artery disease)    cath 2003, occluded S-RCA, occluded S-Dx, L-LAD ok, s/p PTCA to LAD  . Cataract   . CKD (chronic kidney disease) stage 3, GFR 30-59 ml/min (HCC)   . Diabetes mellitus    diet controlled  . Hearing aid worn    bilateral  . History of kidney stones   . Long term (current) use of anticoagulants   . Neuromuscular disorder ([place])   . OSA (obstructive sleep apnea) 12/11   very mild, AHI 7/hr  . Persistent atrial fibrillation ([place]) 09/26/2015  . Pure hyperglyceridemia   . PVD (peripheral vascular disease) ([place])    angioplasty of his right lower extremity in [place] by Dr.[name] 2013  . Unspecified essential hypertension   . Wears dentures    full upper and lower    Past Surgical History:  Procedure Laterality Date  . ABDOMINAL AORTOGRAM W/LOWER EXTREMITY N/A 11/15/2017   Procedure: ABDOMINAL AORTOGRAM W/LOWER EXTREMITY;  Surgeon: [name], MD;  Location: [facility];  Service: Cardiovascular;  Laterality: N/A;  . Adenosine Myoview  3/06   EF 56%, neg. Ischemia  . Adenosine Myoview  02/18/07   nml  . AMPUTATION Left 12/15/2017   Procedure: AMPUTATION BELOW KNEE;  Surgeon: Algernon [name], MD;  Location: [facility];  Service: Vascular;  Laterality: Left;  . ANGIOPLASTY  1/99   CAD- diogonal with rotational artherectomy  . Arthrectomy     of  LAD & PTCA  . BLEPHAROPLASTY Bilateral   . CARDIAC CATHETERIZATION  1/00  . CARDIOVERSION  1/04  . CARDIOVERSION  5/07   hospital- a flutter  . CARPAL TUNNEL RELEASE     ? bilateral  . CATARACT EXTRACTION W/PHACO Right 07/28/2017   Procedure: CATARACT EXTRACTION PHACO AND INTRAOCULAR LENS PLACEMENT ([place]) RIGHT DIABETIC;  Surgeon: [name], MD;  Location: [facility];  Service: Ophthalmology;  Laterality: Right;  Diabetic - diet controlled  .  CATARACT EXTRACTION W/PHACO Left 08/18/2017   Procedure: CATARACT EXTRACTION PHACO AND INTRAOCULAR LENS PLACEMENT (I[facility]);  Surgeon: [name], MD;  Location: [place];  Service: Ophthalmology;  Laterality: Left;  DIABETES - oral meds  . COLONOSCOPY W/ BIOPSIES  10/01/06   sigmoid polyp bx neg, 3 years  . CORONARY ANGIOPLASTY  4/03   cutting balloon PTCA pLAD into Diag  . CORONARY ARTERY BYPASS GRAFT  2000   LIMA-LAD, SVG-RCA, SVG-Diag; SVG-Diag & SVG-RCA occluded 2003  . FRACTURE SURGERY    . HAND SURGERY  08/27/09   R thumb procedure wit Scaphoid Gragt and screws, Dr [name]  . NM MYOVIEW LTD  4/11   normal  . PERIPHERAL VASCULAR CATHETERIZATION Right 06/27/2015   Procedure: Lower Extremity Angiography;  Surgeon: Algernon [name], MD;  Location: [facility] [facility];  Service: Cardiovascular;  Laterality: Right;  . PERIPHERAL VASCULAR CATHETERIZATION  06/27/2015   Procedure: Lower Extremity Intervention;  Surgeon: Algernon [name], MD;  Location: [place] [facility];  Service: Cardiovascular;;     reports that he has never smoked. He quit smokeless tobacco use about 25 years ago. He reports that he does not drink alcohol or use drugs.  Allergies  Allergen Reactions  . Isosorbide Mononitrate Other (See Comments)    Unknown, doesn't remember   . Ramipril Cough  . Quinidine Gluconate Rash    Family History  Problem Relation Age of Onset  . Stroke Father   . Aneurysm Father   . Hypertension Father   . Prostate cancer Brother   . Hypertension Mother   . Lung cancer Brother        smoker  . Thrombosis Brother   . Other Brother        RF valve disorder (smoker)  . Lung cancer Brother        smoker  . Breast cancer Sister   . Liver cancer Brother   . Other Sister        cerebral hemorrhage     Prior to Admission medications   Medication Sig Start Date End Date Taking? Authorizing Provider  amLODipine (NORVASC) 2.5 MG tablet Take 1 tablet (2.5 mg total) by mouth  daily. 12/21/17  Yes Wieting, Richard, MD  apixaban (ELIQUIS) 2.5 MG TABS tablet Take 1 tablet (2.5 mg total) by mouth 2 (two) times daily. 11/05/17  Yes [name], MD  acetaminophen (TYLENOL) 325 MG tablet Take 2 tablets (650 mg total) by mouth every 6 (six) hours as needed for mild pain (or temp >/= 101 F). 12/21/17   Loletha Grayer, MD  B Complex Vitamins (B COMPLEX-B12) TABS Take 1 tablet by mouth daily.     [provider]  [facility] Oil [facility]) OINT Apply 1 application topically 3 (three) times daily. Applied at every shift day, evening, and night to bilateral buttocks/sacrum area    [provider]  Blood Glucose Monitoring Suppl (ONE TOUCH ULTRA SYSTEM KIT) W/DEVICE KIT 1 kit by Does not apply route once.  11/01/14   [name], MD  calcium carbonate (TUMS - DOSED IN MG ELEMENTAL CALCIUM) 500 MG chewable tablet Chew 1 tablet by mouth daily as needed for indigestion or heartburn.    [provider]  cholecalciferol (VITAMIN D) 1000 UNITS tablet Take 1,000 Units by mouth 2 (two) times a week. Takes on Mon and Fri    [provider]  Cinnamon 500 MG capsule Take 1,000 mg by mouth 2 (two) times daily.     [provider]  ciprofloxacin (CIPRO) 500 MG tablet Take 1 tablet (500 mg total) by mouth 2 (two) times daily for 5 days. 12/25/17 12/30/17  Johnson, Clanford L, MD  fenofibrate 160 MG tablet Take 1 tablet (160 mg total) by mouth daily. 04/23/17   [name], MD  finasteride (PROSCAR) 5 MG tablet Take 1 tablet (5 mg total) by mouth daily. 12/21/17   Loletha Grayer, MD  fish oil-omega-3 fatty acids 1000 MG capsule Take 1,000 mg by mouth daily.     [provider]  glucose blood test strip Use as instructed 11/25/17   [name], MD  lisinopril (PRINIVIL,ZESTRIL) 20 MG tablet Take 1 tablet (20 mg total) by mouth daily. 12/21/17   Loletha Grayer, MD  liver oil-zinc oxide (DESITIN) 40 % ointment Apply topically  3 (three) times daily between meals. 12/25/17   Johnson, Clanford L, MD  metoprolol tartrate (LOPRESSOR) 25 MG tablet Take 0.5 tablets (12.5 mg total) by mouth 2 (two) times daily. 12/21/17   Loletha Grayer, MD  Multiple Vitamin (DAILY MULTIVITAMIN PO) Take 1 tablet by mouth daily.      [provider]  nutrition supplement, JUVEN, (JUVEN) PACK Take 1 packet by mouth 2 (two) times daily between meals. 11/16/17   Mikhail, Velta Addison, DO  ondansetron (ZOFRAN) 4 MG tablet Take 4 mg by mouth every 6 (six) hours as needed for nausea or vomiting.    [provider]  [facility] DELICA LANCETS FINE MISC check sugar once daily 08/17/17   [name], MD  pentoxifylline (TRENTAL) 400 MG CR tablet Take 1 tablet (400 mg total) by mouth 3 (three) times daily with meals. 11/24/17   Newt Minion, MD  rosuvastatin (CRESTOR) 40 MG tablet Take 1 tablet (40 mg total) by mouth daily. 08/20/17   [name], MD  tamsulosin (FLOMAX) 0.4 MG CAPS capsule Take 1 capsule (0.4 mg total) by mouth daily. 12/21/17   Loletha Grayer, MD  vancomycin (VANCOCIN) 50 mg/mL oral solution Take 2.5 mLs (125 mg total) by mouth every 6 (six) hours for 5 days. 12/25/17 12/30/17  Murlean Iba, MD    Physical Exam: Vitals:   12/26/17 2020 12/26/17 2021  BP: 125/60   Pulse: 80   Resp: 16   Temp: 97.9 F (36.6 C)   TempSrc: Oral   SpO2: 99%   Weight:  73 kg (161 lb)  Height:  '5\' 5"'  (1.651 m)     Constitutional: NAD, calm  Eyes: PERTLA, lids and conjunctivae normal ENMT: Mucous membranes are moist. Posterior pharynx clear of any exudate or lesions.   Neck: normal, supple, no masses, no thyromegaly Respiratory: clear to auscultation bilaterally, no wheezing, no crackles. Normal respiratory effort.    Cardiovascular: Rate ~80 and irregular. Mild pretibial edema. No significant JVD. Abdomen: No distension, no tenderness, soft. Bowel sounds normal.  Musculoskeletal: no clubbing / cyanosis. Status-post left  BKA.  Skin: no significant rashes, lesions, ulcers. Warm, dry, well-perfused. Neurologic: CN 2-12 grossly  intact. Sensation intact. Strength 5/5 in all 4 limbs.  Psychiatric: Alert and oriented x 3. Pleasant and cooperative.    Labs on Admission: I have personally reviewed following labs and imaging studies  CBC: Recent Labs  Lab 12/22/17 2035 12/23/17 0700 12/24/17 0529 12/25/17 0715 12/26/17 2041  WBC 30.7* 28.1* 19.4* 15.9* 16.0*  NEUTROABS 24.9* 24.5* 15.8* 11.9* 12.9*  HGB 9.4* 9.3* 8.7* 10.4* 9.9*  HCT 28.3* 27.6* 26.5* 32.6* 30.2*  MCV 92.2 91.4 92.7 92.9 91.8  PLT 315 306 303 364 494   Basic Metabolic Panel: Recent Labs  Lab 12/22/17 2035 12/23/17 0700 12/24/17 0529 12/25/17 0715 12/26/17 2041  NA 127* 130* 130* 133* 129*  K 4.1 3.9 4.3 4.7 4.7  CL 95* 97* 101 100* 95*  CO2 '24 25 23 25 26  ' GLUCOSE 177* 149* 180* 115* 211*  BUN 24* 20 21* 19 36*  CREATININE 1.08 0.96 0.88 0.79 1.36*  CALCIUM 8.5* 8.4* 8.2* 8.8* 9.1  MG  --   --  1.4* 1.5*  --    GFR: Estimated Creatinine Clearance: 33.3 mL/min (A) (by C-G formula based on SCr of 1.36 mg/dL (H)). Liver Function Tests: Recent Labs  Lab 12/22/17 1750 12/22/17 2035 12/24/17 0529 12/25/17 0715 12/26/17 2041  AST 54* 53* 42* 60* 70*  ALT 31 32 27 35 44  ALKPHOS 71 76 69 82 98  BILITOT 0.6 0.9 0.5 0.6 0.6  PROT 5.6* 6.0* 5.4* 6.3* 6.2*  ALBUMIN 2.3* 2.5* 2.1* 2.6* 2.6*   Recent Labs  Lab 12/26/17 2041  LIPASE 66*   No results for input(s): AMMONIA in the last 168 hours. Coagulation Profile: No results for input(s): INR, PROTIME in the last 168 hours. Cardiac Enzymes: Recent Labs  Lab 12/26/17 2041  TROPONINI <0.03   BNP (last 3 results) No results for input(s): PROBNP in the last 8760 hours. HbA1C: No results for input(s): HGBA1C in the last 72 hours. CBG: Recent Labs  Lab 12/24/17 1127 12/24/17 1709 12/24/17 2150 12/25/17 0800 12/25/17 1153  GLUCAP 130* 156* 140* 107* 109*   Lipid  Profile: No results for input(s): CHOL, HDL, LDLCALC, TRIG, CHOLHDL, LDLDIRECT in the last 72 hours. Thyroid Function Tests: No results for input(s): TSH, T4TOTAL, FREET4, T3FREE, THYROIDAB in the last 72 hours. Anemia Panel: No results for input(s): VITAMINB12, FOLATE, FERRITIN, TIBC, IRON, RETICCTPCT in the last 72 hours. Urine analysis:    Component Value Date/Time   COLORURINE AMBER (A) 12/26/2017 2140   APPEARANCEUR CLOUDY (A) 12/26/2017 2140   LABSPEC 1.016 12/26/2017 2140   PHURINE 5.0 12/26/2017 2140   GLUCOSEU NEGATIVE 12/26/2017 2140   HGBUR LARGE (A) 12/26/2017 2140   HGBUR trace-lysed 02/07/2008 1101   BILIRUBINUR NEGATIVE 12/26/2017 2140   KETONESUR NEGATIVE 12/26/2017 2140   PROTEINUR 100 (A) 12/26/2017 2140   UROBILINOGEN 0.2 02/07/2008 1101   NITRITE NEGATIVE 12/26/2017 2140   LEUKOCYTESUR SMALL (A) 12/26/2017 2140   Sepsis Labs: '@LABRCNTIP' (procalcitonin:4,lacticidven:4) ) Recent Results (from the past 240 hour(s))  Culture, Urine     Status: Abnormal   Collection Time: 12/22/17  5:50 PM  Result Value Ref Range Status   Specimen Description   Final    URINE, CATHETERIZED Performed at [facility], [address], [place], [place] [address]    Special Requests   Final    NONE Performed at [facility], [address] [place], [place] [address]    Culture >=100,000 COLONIES/mL PSEUDOMONAS AERUGINOSA (A)  Final   Report Status 12/25/2017 FINAL  Final  Organism ID, Bacteria PSEUDOMONAS AERUGINOSA (A)  Final      Susceptibility   Pseudomonas aeruginosa - MIC*    CEFTAZIDIME 4 SENSITIVE Sensitive     CIPROFLOXACIN <=0.25 SENSITIVE Sensitive     GENTAMICIN 2 SENSITIVE Sensitive     IMIPENEM 2 SENSITIVE Sensitive     PIP/TAZO 8 SENSITIVE Sensitive     CEFEPIME 2 SENSITIVE Sensitive     * >=100,000 COLONIES/mL PSEUDOMONAS AERUGINOSA  MRSA PCR Screening     Status: None   Collection Time: 12/23/17  4:33 AM  Result Value Ref Range Status   MRSA by PCR  NEGATIVE NEGATIVE Final    Comment:        The GeneXpert MRSA Assay (FDA approved for NASAL specimens only), is one component of a comprehensive MRSA colonization surveillance program. It is not intended to diagnose MRSA infection nor to guide or monitor treatment for MRSA infections. Performed at [facility], [address] [place], [place] [address]      Radiological Exams on Admission: Ct Abdomen Pelvis Wo Contrast  Result Date: 12/26/2017 CLINICAL DATA:  84 year old male with acute abdominal pain, nausea and vomiting today. EXAM: CT ABDOMEN AND PELVIS WITHOUT CONTRAST TECHNIQUE: Multidetector CT imaging of the abdomen and pelvis was performed following the standard protocol without IV contrast. COMPARISON:  12/22/2017 and prior CTs FINDINGS: Please note that parenchymal abnormalities may be missed without intravenous contrast. Lower chest: No acute abnormality.  Cardiomegaly identified. Hepatobiliary: Probable hepatic cysts again noted. A 1.5 cm gallstone is identified without CT evidence of acute cholecystitis. No biliary dilatation. Pancreas: Equivocal minimal stranding along the pancreatic head noted and could represent mild pancreatitis. No other pancreatic abnormalities noted. Spleen: Unremarkable Adrenals/Urinary Tract: The kidneys and adrenal glands are unremarkable except for bilateral renal cortical atrophy. No hydronephrosis or urinary calculi. A Foley catheter is present within the bladder. Bladder diverticula again identified. Stomach/Bowel: Stomach is within normal limits. Appendix appears normal. No evidence of bowel wall thickening, distention, or inflammatory changes. Colonic diverticulosis noted without evidence of diverticulitis. Vascular/Lymphatic: Aortic atherosclerosis. No enlarged abdominal or pelvic lymph nodes. Reproductive: Prostate is unremarkable. Other: No free fluid, focal collection or pneumoperitoneum. Musculoskeletal: No acute bony abnormality. Bilateral L5 pars  defects with grade 1 anterolisthesis of L5 on S1 again noted. No suspicious bony lesions identified. IMPRESSION: 1. Equivocal minimal stranding along the pancreatic head which could represent mild pancreatitis. No acute fluid collection. 2. Cholelithiasis without CT evidence of acute cholecystitis 3. Cardiomegaly 4. Unchanged bilateral L5 pars defects with grade 1 spondylolisthesis at L5-S1 5.  Aortic Atherosclerosis (ICD10-I70.0). Electronically Signed   By: Margarette Canada M.D.   On: 12/26/2017 21:41   Dg Chest 2 View  Result Date: 12/26/2017 CLINICAL DATA:  Nausea and vomiting EXAM: CHEST - 2 VIEW COMPARISON:  12/22/2017, 08/26/2010 FINDINGS: Post sternotomy changes. No pleural effusion. Cardiomegaly. No focal airspace disease. No pneumothorax. Mild ankylosis of the spine with mild mid to lower vertebral wedging. IMPRESSION: No active cardiopulmonary disease.  Cardiomegaly Electronically Signed   By: Donavan Foil M.D.   On: 12/26/2017 21:41    EKG: Independently reviewed. Atrial fibrillation.   Assessment/Plan   1. Acute kidney injury  - SCr is 1.36 on admission, up from 0.79 the day before  - No hydronephrosis on CT  - Likely prerenal in setting of nausea and vomiting  - Treated in ED with 500 cc NS  - Continue gentle IVF hydration, renally-dose medications, avoid nephrotoxins, repeat chem panel in am  2. Nausea, vomiting  - Presents from SNF with N/V, no diarrhea, no abd pain, and no fever/chills  - CT abd/pelvis with possible stranding about the pancreas and lipase is slightly elevated at 66  - Doubt pancreatitis given absence of abdominal pain  - Suspected secondary to ciprofloxacin which he had just started; may only need 1-2 days more of abx for UTI as discussed below and will be treated with cefepime for now which he tolerated well recently - Continue gentle IVF hydration and prn antiemetics    3. Hyponatremia  - Serum sodium is 129 on admission  - Hyperglycemia is contributing  some and he may be hypovolemic after N/V  - Treated in ED with 500 cc NS  - Continue gentle IVF hydration, correct sugars, treat N/V, repeat chem panel in am    4. UTI  - Was admitted on 5/15 with UTI, culture growing pan-sensitive pseudomonas  - He was treated with cefepime for 4 days, switched to ciprofloxacin at discharge on 5/18 and now presents with N/V that is possible secondary to ciprofloxacin  - Plan to resume cefepime for now, repeat UA and culture, and consider stopping abx in the next 1-2 days    5. C diff colitis  - Diarrhea has resolved  - CT abd/pelvis without acute inflammation or complication  - Continue oral vancomycin to complete course   6. Atrial fibrillation  - In rate-controlled a fib on admission  - CHADS-VASc is 58 (age x2, hx DM, HTN, CAD)  - Continue Eliquis and metoprolol    7. Type II DM  - Currently diet-controlled  - Serum glucose low 200's  - Check CBG's and use low-intensity SSI while in hospital    8. Anemia  - Hgb is stable on admission at 9.9  - No bleeding  - Continue B12 supplementation     DVT prophylaxis: Eliquis  Code Status: Full  Family Communication: Wife and son updated at bedside  Consults called: None Admission status: Inpatient    Vianne Bulls, MD [facility] Pager [phone]  If 7PM-7AM, please contact night-coverage [email or web address] Password [facility]  12/26/2017, 10:22 PM

## 2017-12-26 NOTE — ED Triage Notes (Signed)
Pt brought from [facility] for evaluation d/t N/V/ that started after given oral antibx. Being tx for c-diff and UTI per SNF staff. Pt arrived with foley in place.

## 2017-12-26 NOTE — ED Provider Notes (Signed)
[facility] EMERGENCY DEPARTMENT Provider Note   CSN: [id number] Arrival date & time: 12/26/17  2016     History   Chief Complaint Chief Complaint  Patient presents with  . Emesis    HPI Eric [name]. is a 84 y.o. male.  HPI  Pt was seen at 2030.  Per NH report, family, and pt, c/o gradual onset and persistence of multiple intermittent episodes of N/V that began approximately 1630 PTA. States he was given zofran without improvement. Has been associated with "sour feeling" in his mid-upper abd. Pt was d/c from hospital yesterday for dx cdiff, hyponatremia, UTI.  Denies abd pain, no CP/SOB, no back pain, no fevers, no black or blood in stools or emesis.    Past Medical History:  Diagnosis Date  . Atrial flutter ([place])    s/p RFCA  . CAD (coronary artery disease)    cath 2003, occluded S-RCA, occluded S-Dx, L-LAD ok, s/p PTCA to LAD  . Cataract   . CKD (chronic kidney disease) stage 3, GFR 30-59 ml/min (HCC)   . Diabetes mellitus    diet controlled  . Hearing aid worn    bilateral  . History of kidney stones   . Long term (current) use of anticoagulants   . Neuromuscular disorder ([place])   . OSA (obstructive sleep apnea) 12/11   very mild, AHI 7/hr  . Persistent atrial fibrillation ([place]) 09/26/2015  . Pure hyperglyceridemia   . PVD (peripheral vascular disease) ([place])    angioplasty of his right lower extremity in [place] by Dr.[name] 2013  . Unspecified essential hypertension   . Wears dentures    full upper and lower    Patient Active Problem List   Diagnosis Date Noted  . Pressure injury of skin 12/23/2017  . C. difficile colitis 12/22/2017  . Acute lower UTI 12/22/2017  . Hyponatremia 12/22/2017  . Normocytic anemia 12/22/2017  . Acute UTI 12/22/2017  . Gangrene of left foot ([place]) 12/15/2017  . Chronic anticoagulation 09/26/2015  . Persistent atrial fibrillation ([place]) 09/26/2015  . Olecranon bursitis 04/23/2014  . PVD (peripheral vascular disease) ([place])  06/02/2012  . Microhematuria 11/16/2011  . BPH (benign prostatic hyperplasia) 11/16/2011  . CKD (chronic kidney disease), stage II 08/26/2011  . Leg pain 08/26/2011  . Coronary artery disease 03/25/2011  . OBSTRUCTIVE SLEEP APNEA 09/15/2010  . PERIODIC LIMB MOVEMENT DISORDER 09/15/2010  . PERSONAL HISTORY OF COLONIC POLYPS 10/25/2009  . VITAMIN B12 DEFICIENCY 03/04/2009  . HYPERTRIGLYCERIDEMIA 11/24/2006  . Essential hypertension 11/24/2006    Past Surgical History:  Procedure Laterality Date  . ABDOMINAL AORTOGRAM W/LOWER EXTREMITY N/A 11/15/2017   Procedure: ABDOMINAL AORTOGRAM W/LOWER EXTREMITY;  Surgeon: [name], MD;  Location: [place] [facility];  Service: Cardiovascular;  Laterality: N/A;  . Adenosine Myoview  3/06   EF 56%, neg. Ischemia  . Adenosine Myoview  02/18/07   nml  . AMPUTATION Left 12/15/2017   Procedure: AMPUTATION BELOW KNEE;  Surgeon: Algernon [name], MD;  Location: [facility];  Service: Vascular;  Laterality: Left;  . ANGIOPLASTY  1/99   CAD- diogonal with rotational artherectomy  . Arthrectomy     of LAD & PTCA  . BLEPHAROPLASTY Bilateral   . CARDIAC CATHETERIZATION  1/00  . CARDIOVERSION  1/04  . CARDIOVERSION  5/07   hospital- a flutter  . CARPAL TUNNEL RELEASE     ? bilateral  . CATARACT EXTRACTION W/PHACO Right 07/28/2017   Procedure: CATARACT EXTRACTION PHACO AND INTRAOCULAR LENS PLACEMENT (  IOC) RIGHT DIABETIC;  Surgeon: [name], MD;  Location: [place];  Service: Ophthalmology;  Laterality: Right;  Diabetic - diet controlled  . CATARACT EXTRACTION W/PHACO Left 08/18/2017   Procedure: CATARACT EXTRACTION PHACO AND INTRAOCULAR LENS PLACEMENT (IOC);  Surgeon: [name], MD;  Location: [place];  Service: Ophthalmology;  Laterality: Left;  DIABETES - oral meds  . COLONOSCOPY W/ BIOPSIES  10/01/06   sigmoid polyp bx neg, 3 years  . CORONARY ANGIOPLASTY  4/03   cutting balloon PTCA pLAD into Diag  . CORONARY  ARTERY BYPASS GRAFT  2000   LIMA-LAD, SVG-RCA, SVG-Diag; SVG-Diag & SVG-RCA occluded 2003  . FRACTURE SURGERY    . HAND SURGERY  08/27/09   R thumb procedure wit Scaphoid Gragt and screws, Dr [name]  . NM MYOVIEW LTD  4/11   normal  . PERIPHERAL VASCULAR CATHETERIZATION Right 06/27/2015   Procedure: Lower Extremity Angiography;  Surgeon: Algernon [name], MD;  Location: [place] [facility];  Service: Cardiovascular;  Laterality: Right;  . PERIPHERAL VASCULAR CATHETERIZATION  06/27/2015   Procedure: Lower Extremity Intervention;  Surgeon: Algernon [name], MD;  Location: [place] [facility];  Service: Cardiovascular;;        Home Medications    Prior to Admission medications   Medication Sig Start Date End Date Taking? Authorizing Provider  acetaminophen (TYLENOL) 325 MG tablet Take 2 tablets (650 mg total) by mouth every 6 (six) hours as needed for mild pain (or temp >/= 101 F). 12/21/17   Loletha Grayer, MD  amLODipine (NORVASC) 2.5 MG tablet Take 1 tablet (2.5 mg total) by mouth daily. 12/21/17   Loletha Grayer, MD  apixaban (ELIQUIS) 2.5 MG TABS tablet Take 1 tablet (2.5 mg total) by mouth 2 (two) times daily. 11/05/17   [name], MD  B Complex Vitamins (B COMPLEX-B12) TABS Take 1 tablet by mouth daily.     [provider]  Janne [facility] Oil [facility]) OINT Apply 1 application topically 3 (three) times daily. Applied at every shift day, evening, and night to bilateral buttocks/sacrum area    [provider]  Blood Glucose Monitoring Suppl (ONE TOUCH ULTRA SYSTEM KIT) W/DEVICE KIT 1 kit by Does not apply route once. 11/01/14   [name], MD  calcium carbonate (TUMS - DOSED IN MG ELEMENTAL CALCIUM) 500 MG chewable tablet Chew 1 tablet by mouth daily as needed for indigestion or heartburn.    [provider]  cholecalciferol (VITAMIN D) 1000 UNITS tablet Take 1,000 Units by mouth 2 (two) times a week. Takes on Mon and Fri    [provider]    Cinnamon 500 MG capsule Take 1,000 mg by mouth 2 (two) times daily.     [provider]  ciprofloxacin (CIPRO) 500 MG tablet Take 1 tablet (500 mg total) by mouth 2 (two) times daily for 5 days. 12/25/17 12/30/17  Johnson, Clanford L, MD  fenofibrate 160 MG tablet Take 1 tablet (160 mg total) by mouth daily. 04/23/17   [name], MD  finasteride (PROSCAR) 5 MG tablet Take 1 tablet (5 mg total) by mouth daily. 12/21/17   Loletha Grayer, MD  fish oil-omega-3 fatty acids 1000 MG capsule Take 1,000 mg by mouth daily.     [provider]  glucose blood test strip Use as instructed 11/25/17   [name], MD  lisinopril (PRINIVIL,ZESTRIL) 20 MG tablet Take 1 tablet (20 mg total) by mouth daily. 12/21/17   Wieting, Richard,  MD  liver oil-zinc oxide (DESITIN) 40 % ointment Apply topically 3 (three) times daily between meals. 12/25/17   Johnson, Clanford L, MD  metoprolol tartrate (LOPRESSOR) 25 MG tablet Take 0.5 tablets (12.5 mg total) by mouth 2 (two) times daily. 12/21/17   Loletha Grayer, MD  Multiple Vitamin (DAILY MULTIVITAMIN PO) Take 1 tablet by mouth daily.      [provider]  nitroGLYCERIN (NITRODUR - DOSED IN MG/24 HR) 0.2 mg/hr patch Place 1 patch (0.2 mg total) onto the skin daily. 11/24/17   Newt Minion, MD  nutrition supplement, JUVEN, Fanny Dance) PACK Take 1 packet by mouth 2 (two) times daily between meals. 11/16/17   Mikhail, Velta Addison, DO  ondansetron (ZOFRAN) 4 MG tablet Take 4 mg by mouth every 6 (six) hours as needed for nausea or vomiting.    [provider]  [facility] DELICA LANCETS FINE MISC check sugar once daily 08/17/17   [name], MD  pentoxifylline (TRENTAL) 400 MG CR tablet Take 1 tablet (400 mg total) by mouth 3 (three) times daily with meals. 11/24/17   Newt Minion, MD  rosuvastatin (CRESTOR) 40 MG tablet Take 1 tablet (40 mg total) by mouth daily. 08/20/17   [name], MD  tamsulosin (FLOMAX) 0.4 MG CAPS capsule Take  1 capsule (0.4 mg total) by mouth daily. 12/21/17   Loletha Grayer, MD  vancomycin (VANCOCIN) 50 mg/mL oral solution Take 2.5 mLs (125 mg total) by mouth every 6 (six) hours for 5 days. 12/25/17 12/30/17  Murlean Iba, MD    Family History Family History  Problem Relation Age of Onset  . Stroke Father   . Aneurysm Father   . Hypertension Father   . Prostate cancer Brother   . Hypertension Mother   . Lung cancer Brother        smoker  . Thrombosis Brother   . Other Brother        RF valve disorder (smoker)  . Lung cancer Brother        smoker  . Breast cancer Sister   . Liver cancer Brother   . Other Sister        cerebral hemorrhage    Social History Social History   Tobacco Use  . Smoking status: Never Smoker  . Smokeless tobacco: Former Network engineer Use Topics  . Alcohol use: No  . Drug use: No     Allergies   Isosorbide mononitrate; Ramipril; and Quinidine gluconate   Review of Systems Review of Systems ROS: Statement: All systems negative except as marked or noted in the HPI; Constitutional: Negative for fever and chills. ; ; Eyes: Negative for eye pain, redness and discharge. ; ; ENMT: Negative for ear pain, hoarseness, nasal congestion, sinus pressure and sore throat. ; ; Cardiovascular: Negative for chest pain, palpitations, diaphoresis, dyspnea and peripheral edema. ; ; Respiratory: Negative for cough, wheezing and stridor. ; ; Gastrointestinal: +N/V. Negative for diarrhea, abdominal pain, blood in stool, hematemesis, jaundice and rectal bleeding. . ; ; Genitourinary: Negative for dysuria, flank pain and hematuria. ; ; Musculoskeletal: Negative for back pain and neck pain. Negative for swelling and trauma.; ; Skin: Negative for pruritus, rash, abrasions, blisters, bruising and skin lesion.; ; Neuro: Negative for headache, lightheadedness and neck stiffness. Negative for weakness, altered level of consciousness, altered mental status, extremity weakness,  paresthesias, involuntary movement, seizure and syncope.       Physical Exam Updated Vital Signs BP 125/60 (BP Location: Right Arm)  Pulse 80   Temp 97.9 F (36.6 C) (Oral)   Resp 16   Ht 5' 5" (1.651 m)   Wt 73 kg (161 lb)   SpO2 99%   BMI 26.79 kg/m    Patient Vitals for the past 24 hrs:  BP Temp Temp src Pulse Resp SpO2 Height Weight  12/26/17 2021 - - - - - - 5' 5" (1.651 m) 73 kg (161 lb)  12/26/17 2020 125/60 97.9 F (36.6 C) Oral 80 16 99 % - -     Physical Exam 2035: Physical examination:  Nursing notes reviewed; Vital signs and O2 SAT reviewed;  Constitutional: Well developed, Well nourished, In no acute distress; Head:  Normocephalic, atraumatic; Eyes: EOMI, PERRL, No scleral icterus; ENMT: Mouth and pharynx normal, Mucous membranes dry; Neck: Supple, Full range of motion, No lymphadenopathy; Cardiovascular: Irregular rate and rhythm, No gallop; Respiratory: Breath sounds clear & equal bilaterally, No wheezes.  Speaking full sentences with ease, Normal respiratory effort/excursion; Chest: Nontender, Movement normal; Abdomen: Soft, +mid-epigastric tenderness to palp. No rebound or guarding. Nondistended, Normal bowel sounds; Genitourinary: No CVA tenderness. Foley draining dark yellow urine.; Extremities: Peripheral pulses normal, No tenderness, No edema, +left BKA..; Neuro: AA&Ox3, Major CN grossly intact.  Speech clear. No gross focal motor or sensory deficits in extremities.; Skin: Color normal, Warm, Dry.   ED Treatments / Results  Labs (all labs ordered are listed, but only abnormal results are displayed)   EKG EKG Interpretation  Date/Time:  Sunday Dec 26 2017 20:45:29 EDT Ventricular Rate:  79 PR Interval:    QRS Duration: 94 QT Interval:  389 QTC Calculation: 446 R Axis:   19 Text Interpretation:  Atrial fibrillation When compared with ECG of 12/13/2017 No significant change was found Confirmed by Francine Graven [id number]) on 12/26/2017 8:49:36  PM   Radiology   Procedures Procedures (including critical care time)  Medications Ordered in ED Medications  0.9 %  sodium chloride infusion (has no administration in time range)  ondansetron (ZOFRAN) injection 4 mg (has no administration in time range)  famotidine (PEPCID) IVPB 20 mg premix (has no administration in time range)     Initial Impression / Assessment and Plan / ED Course  I have reviewed the triage vital signs and the nursing notes.  Pertinent labs & imaging results that were available during my care of the patient were reviewed by me and considered in my medical decision making (see chart for details).  MDM Reviewed: previous chart, nursing note and vitals Reviewed previous: labs, ECG and CT scan Interpretation: labs, ECG, x-ray and CT scan Total time providing critical care: 30-74 minutes. This excludes time spent performing separately reportable procedures and services. Consults: admitting MD   CRITICAL CARE Performed by: Alfonzo Feller Total critical care time: 35 minutes Critical care time was exclusive of separately billable procedures and treating other patients. Critical care was necessary to treat or prevent imminent or life-threatening deterioration. Critical care was time spent personally by me on the following activities: development of treatment plan with patient and/or surrogate as well as nursing, discussions with consultants, evaluation of patient's response to treatment, examination of patient, obtaining history from patient or surrogate, ordering and performing treatments and interventions, ordering and review of laboratory studies, ordering and review of radiographic studies, pulse oximetry and re-evaluation of patient's condition.   Results for orders placed or performed during the hospital encounter of 12/26/17  Lipase, blood  Result Value Ref Range   Lipase 66 (H) 11 -  51 U/L  Comprehensive metabolic panel  Result Value Ref Range    Sodium 129 (L) 135 - 145 mmol/L   Potassium 4.7 3.5 - 5.1 mmol/L   Chloride 95 (L) 101 - 111 mmol/L   CO2 26 22 - 32 mmol/L   Glucose, Bld 211 (H) 65 - 99 mg/dL   BUN 36 (H) 6 - 20 mg/dL   Creatinine, Ser 1.36 (H) 0.61 - 1.24 mg/dL   Calcium 9.1 8.9 - 10.3 mg/dL   Total Protein 6.2 (L) 6.5 - 8.1 g/dL   Albumin 2.6 (L) 3.5 - 5.0 g/dL   AST 70 (H) 15 - 41 U/L   ALT 44 17 - 63 U/L   Alkaline Phosphatase 98 38 - 126 U/L   Total Bilirubin 0.6 0.3 - 1.2 mg/dL   GFR calc non Af Amer 45 (L) >60 mL/min   GFR calc Af Amer 52 (L) >60 mL/min   Anion gap 8 5 - 15  Troponin I  Result Value Ref Range   Troponin I <0.03 <0.03 ng/mL  Lactic acid, plasma  Result Value Ref Range   Lactic Acid, Venous 2.0 (HH) 0.5 - 1.9 mmol/L  CBC with Differential  Result Value Ref Range   WBC 16.0 (H) 4.0 - 10.5 K/uL   RBC 3.29 (L) 4.22 - 5.81 MIL/uL   Hemoglobin 9.9 (L) 13.0 - 17.0 g/dL   HCT 30.2 (L) 39.0 - 52.0 %   MCV 91.8 78.0 - 100.0 fL   MCH 30.1 26.0 - 34.0 pg   MCHC 32.8 30.0 - 36.0 g/dL   RDW 14.7 11.5 - 15.5 %   Platelets 345 150 - 400 K/uL   Neutrophils Relative % 79 %   Neutro Abs 12.9 (H) 1.7 - 7.7 K/uL   Lymphocytes Relative 13 %   Lymphs Abs 2.0 0.7 - 4.0 K/uL   Monocytes Relative 6 %   Monocytes Absolute 0.9 0.1 - 1.0 K/uL   Eosinophils Relative 1 %   Eosinophils Absolute 0.2 0.0 - 0.7 K/uL   Basophils Relative 1 %   Basophils Absolute 0.1 0.0 - 0.1 K/uL   Ct Abdomen Pelvis Wo Contrast Result Date: 12/26/2017 CLINICAL DATA:  84 year old male with acute abdominal pain, nausea and vomiting today. EXAM: CT ABDOMEN AND PELVIS WITHOUT CONTRAST TECHNIQUE: Multidetector CT imaging of the abdomen and pelvis was performed following the standard protocol without IV contrast. COMPARISON:  12/22/2017 and prior CTs FINDINGS: Please note that parenchymal abnormalities may be missed without intravenous contrast. Lower chest: No acute abnormality.  Cardiomegaly identified. Hepatobiliary: Probable  hepatic cysts again noted. A 1.5 cm gallstone is identified without CT evidence of acute cholecystitis. No biliary dilatation. Pancreas: Equivocal minimal stranding along the pancreatic head noted and could represent mild pancreatitis. No other pancreatic abnormalities noted. Spleen: Unremarkable Adrenals/Urinary Tract: The kidneys and adrenal glands are unremarkable except for bilateral renal cortical atrophy. No hydronephrosis or urinary calculi. A Foley catheter is present within the bladder. Bladder diverticula again identified. Stomach/Bowel: Stomach is within normal limits. Appendix appears normal. No evidence of bowel wall thickening, distention, or inflammatory changes. Colonic diverticulosis noted without evidence of diverticulitis. Vascular/Lymphatic: Aortic atherosclerosis. No enlarged abdominal or pelvic lymph nodes. Reproductive: Prostate is unremarkable. Other: No free fluid, focal collection or pneumoperitoneum. Musculoskeletal: No acute bony abnormality. Bilateral L5 pars defects with grade 1 anterolisthesis of L5 on S1 again noted. No suspicious bony lesions identified. IMPRESSION: 1. Equivocal minimal stranding along the pancreatic head which could represent mild pancreatitis. No acute  fluid collection. 2. Cholelithiasis without CT evidence of acute cholecystitis 3. Cardiomegaly 4. Unchanged bilateral L5 pars defects with grade 1 spondylolisthesis at L5-S1 5.  Aortic Atherosclerosis (ICD10-I70.0). Electronically Signed   By: Margarette Canada M.D.   On: 12/26/2017 21:41    Dg Chest 2 View Result Date: 12/26/2017 CLINICAL DATA:  Nausea and vomiting EXAM: CHEST - 2 VIEW COMPARISON:  12/22/2017, 08/26/2010 FINDINGS: Post sternotomy changes. No pleural effusion. Cardiomegaly. No focal airspace disease. No pneumothorax. Mild ankylosis of the spine with mild mid to lower vertebral wedging. IMPRESSION: No active cardiopulmonary disease.  Cardiomegaly Electronically Signed   By: Donavan Foil M.D.   On:  12/26/2017 21:41     Results for Eric [name], Eric [name] (MRN [id number]) as of 12/26/2017 21:18  Ref. Range 12/23/2017 07:00 12/24/2017 05:29 12/25/2017 07:15 12/26/2017 20:41  Hemoglobin Latest Ref Range: 13.0 - 17.0 g/dL 9.3 (L) 8.7 (L) 10.4 (L) 9.9 (L)  HCT Latest Ref Range: 39.0 - 52.0 % 27.6 (L) 26.5 (L) 32.6 (L) 30.2 (L)    Results for Eric [name], Eric [name] (MRN [id number]) as of 12/26/2017 21:18  Ref. Range 12/23/2017 07:00 12/24/2017 05:29 12/25/2017 07:15 12/26/2017 20:41  Sodium Latest Ref Range: 135 - 145 mmol/L 130 (L) 130 (L) 133 (L) 129 (L)  Potassium Latest Ref Range: 3.5 - 5.1 mmol/L 3.9 4.3 4.7 4.7  BUN Latest Ref Range: 6 - 20 mg/dL 20 21 (H) 19 36 (H)  Creatinine Latest Ref Range: 0.61 - 1.24 mg/dL 0.96 0.88 0.79 1.36 (H)    2205:  Recurrent/new hyponatremia on labs; judicious IVF given. Lipase mildly elevated and CT with mild pancreatitis. Pt already being tx for UTI with cipro. Will admit.  T/C returned from [facility] Dr. Myna Hidalgo, case discussed, including:  HPI, pertinent PM/SHx, VS/PE, dx testing, ED course and treatment:  Agreeable to admit.     Final Clinical Impressions(s) / ED Diagnoses   Final diagnoses:  None    ED Discharge Orders    None       Francine Graven, DO 12/29/17 [id number]

## 2017-12-26 NOTE — ED Notes (Addendum)
Critical Lactic Acid of 2.0 called by lab  Dr Lenna Sciara apprised

## 2017-12-27 ENCOUNTER — Encounter (HOSPITAL_COMMUNITY)
Admission: RE | Admit: 2017-12-27 | Discharge: 2017-12-27 | Disposition: A | Discharge status: Home / Self Care | Payer: PPO | Source: Skilled Nursing Facility | Attending: Internal Medicine | Admitting: Internal Medicine

## 2017-12-27 DIAGNOSIS — I10 Essential (primary) hypertension: Secondary | ICD-10-CM | POA: Insufficient documentation

## 2017-12-27 DIAGNOSIS — S88112D Complete traumatic amputation at level between knee and ankle, left lower leg, subsequent encounter: Secondary | ICD-10-CM | POA: Insufficient documentation

## 2017-12-27 DIAGNOSIS — N390 Urinary tract infection, site not specified: Secondary | ICD-10-CM | POA: Insufficient documentation

## 2017-12-27 DIAGNOSIS — Z4781 Encounter for orthopedic aftercare following surgical amputation: Secondary | ICD-10-CM | POA: Insufficient documentation

## 2017-12-27 DIAGNOSIS — N179 Acute kidney failure, unspecified: Principal | ICD-10-CM

## 2017-12-27 LAB — COMPREHENSIVE METABOLIC PANEL
ALBUMIN: 2.3 g/dL — AB (ref 3.5–5.0)
ALK PHOS: 77 U/L (ref 38–126)
ALT: 35 U/L (ref 17–63)
ANION GAP: 7 (ref 5–15)
AST: 50 U/L — ABNORMAL HIGH (ref 15–41)
BILIRUBIN TOTAL: 0.6 mg/dL (ref 0.3–1.2)
BUN: 34 mg/dL — ABNORMAL HIGH (ref 6–20)
CALCIUM: 8.6 mg/dL — AB (ref 8.9–10.3)
CO2: 24 mmol/L (ref 22–32)
CREATININE: 1.23 mg/dL (ref 0.61–1.24)
Chloride: 101 mmol/L (ref 101–111)
GFR calc non Af Amer: 51 mL/min — ABNORMAL LOW (ref >60)
GFR, EST AFRICAN AMERICAN: 59 mL/min — AB (ref >60)
GLUCOSE: 103 mg/dL — AB (ref 65–99)
Potassium: 4.9 mmol/L (ref 3.5–5.1)
Sodium: 132 mmol/L — ABNORMAL LOW (ref 135–145)
TOTAL PROTEIN: 5.5 g/dL — AB (ref 6.5–8.1)

## 2017-12-27 LAB — GLUCOSE, CAPILLARY
GLUCOSE-CAPILLARY: 100 mg/dL — AB (ref 65–99)
GLUCOSE-CAPILLARY: 83 mg/dL (ref 65–99)
Glucose-Capillary: 87 mg/dL (ref 65–99)
Glucose-Capillary: 102 mg/dL — ABNORMAL HIGH (ref 65–99)
Glucose-Capillary: 94 mg/dL (ref 65–99)

## 2017-12-27 MED ORDER — SODIUM CHLORIDE 0.9 % IV SOLN
2.0000 g | INTRAVENOUS | Status: DC
  Administered 2017-12-28: 2 g via INTRAVENOUS
  Filled 2017-12-27 (×2): qty 2

## 2017-12-27 MED ORDER — SODIUM CHLORIDE 0.9 % IV SOLN
INTRAVENOUS | Status: AC
  Administered 2017-12-27: 18:00:00 via INTRAVENOUS

## 2017-12-27 NOTE — Progress Notes (Signed)
PROGRESS NOTE    Eric [name].  RWE:[id number] DOB: 1927/09/07 DOA: 12/26/2017 PCP: [name], MD     Brief Narrative:  84 year old man admitted from SNF on 5/19 due to vomiting.  He was recently discharged with a Pseudomonas UTI.  In the ED he was found to have acute renal failure and admission was requested.   Assessment & Plan:   Principal Problem:   AKI (acute kidney injury) ([facility]) Active Problems:   Essential hypertension   Coronary artery disease   Persistent atrial fibrillation ([facility])   C. difficile colitis   UTI (urinary tract infection)   Hyponatremia   Normocytic anemia   Nausea and vomiting in adult   Acute renal failure -Creatinine was 1.36 on admission, today is down to 1.23. -Suspect due to prerenal azotemia and dehydration with acute GI losses. -No abnormalities on CT scan. -Did have some emesis today after breakfast, suspect related to Cipro that he was sent home on to complete treatment for his pseudomonal UTI. -Give some more IV fluids over the next 12 hours.  Vomiting -As above, suspect related to stomach upset from Cipro.  No emesis since this morning. -Will observe another 24 hours to proceed with discharge back to SNF if stable.  Pseudomonas UTI -1 more day of cefepime remaining.  C. difficile colitis -Diarrhea has resolved, continue oral vancomycin to complete course.  Type 2 diabetes -Well-controlled.   DVT prophylaxis: Eliquis Code Status: Full code Family Communication: Patient's wife and granddaughter at bedside updated on plan of care and questions answered Disposition Plan: Back to SNF, suspect in 24 hours  Consultants:   None  Procedures:   None  Antimicrobials:  Anti-infectives (From admission, onward)   Start     Dose/Rate Route Frequency Ordered Stop   12/28/17 0156  ceFEPIme (MAXIPIME) 2 g in sodium chloride 0.9 % 100 mL IVPB     2 g 200 mL/hr over 30 Minutes Intravenous Every 24 hours 12/27/17 1143     12/27/17 0000  vancomycin (VANCOCIN) 50 mg/mL oral solution 125 mg     125 mg Oral Every 6 hours 12/26/17 2242 12/31/17 0159   12/26/17 2245  ceFEPIme (MAXIPIME) 2 g in sodium chloride 0.9 % 100 mL IVPB  Status:  Discontinued     2 g 200 mL/hr over 30 Minutes Intravenous Every 12 hours 12/26/17 2242 12/27/17 1143       Subjective: Lying in bed, currently feels well but had some vomiting after breakfast, denies abdominal pain  Objective: Vitals:   12/26/17 2300 12/26/17 2344 12/27/17 0621 12/27/17 1345  BP: 130/63 (!) 155/61 119/65 129/60  Pulse: 80 72 (!) 55 68  Resp: 16 19 15 18   Temp:  98.3 F (36.8 C) 98.3 F (36.8 C) 98.5 F (36.9 C)  TempSrc:  Oral Oral Oral  SpO2: 93% 99% 100% 97%  Weight:  73.6 kg (162 lb 4.1 oz) 74.5 kg (164 lb 3.9 oz)   Height:  5\' 5"  (1.651 m)      Intake/Output Summary (Last 24 hours) at 12/27/2017 1607 Last data filed at 12/27/2017 1200 Gross per 24 hour  Intake 828.75 ml  Output 350 ml  Net 478.75 ml   Filed Weights   12/26/17 2021 12/26/17 2344 12/27/17 0621  Weight: 73 kg (161 lb) 73.6 kg (162 lb 4.1 oz) 74.5 kg (164 lb 3.9 oz)    Examination:  General exam: Alert, awake, oriented x 3 Respiratory system: Clear to auscultation. Respiratory effort normal. Cardiovascular  system:RRR. No murmurs, rubs, gallops. Gastrointestinal system: Abdomen is nondistended, soft and nontender. No organomegaly or masses felt. Normal bowel sounds heard. Central nervous system: Alert and oriented. No focal neurological deficits. Extremities: No C/C/E, +pedal pulses Skin: No rashes, lesions or ulcers Psychiatry: Judgement and insight appear normal. Mood & affect appropriate.     Data Reviewed: I have personally reviewed following labs and imaging studies  CBC: Recent Labs  Lab 12/22/17 2035 12/23/17 0700 12/24/17 0529 12/25/17 0715 12/26/17 2041  WBC 30.7* 28.1* 19.4* 15.9* 16.0*  NEUTROABS 24.9* 24.5* 15.8* 11.9* 12.9*  HGB 9.4* 9.3* 8.7* 10.4*  9.9*  HCT 28.3* 27.6* 26.5* 32.6* 30.2*  MCV 92.2 91.4 92.7 92.9 91.8  PLT 315 306 303 364 409   Basic Metabolic Panel: Recent Labs  Lab 12/23/17 0700 12/24/17 0529 12/25/17 0715 12/26/17 2041 12/27/17 0457  NA 130* 130* 133* 129* 132*  K 3.9 4.3 4.7 4.7 4.9  CL 97* 101 100* 95* 101  CO2 25 23 25 26 24   GLUCOSE 149* 180* 115* 211* 103*  BUN 20 21* 19 36* 34*  CREATININE 0.96 0.88 0.79 1.36* 1.23  CALCIUM 8.4* 8.2* 8.8* 9.1 8.6*  MG  --  1.4* 1.5*  --   --    GFR: Estimated Creatinine Clearance: 39.9 mL/min (by C-G formula based on SCr of 1.23 mg/dL). Liver Function Tests: Recent Labs  Lab 12/22/17 2035 12/24/17 0529 12/25/17 0715 12/26/17 2041 12/27/17 0457  AST 53* 42* 60* 70* 50*  ALT 32 27 35 44 35  ALKPHOS 76 69 82 98 77  BILITOT 0.9 0.5 0.6 0.6 0.6  PROT 6.0* 5.4* 6.3* 6.2* 5.5*  ALBUMIN 2.5* 2.1* 2.6* 2.6* 2.3*   Recent Labs  Lab 12/26/17 2041  LIPASE 66*   No results for input(s): AMMONIA in the last 168 hours. Coagulation Profile: No results for input(s): INR, PROTIME in the last 168 hours. Cardiac Enzymes: Recent Labs  Lab 12/26/17 2041  TROPONINI <0.03   BNP (last 3 results) No results for input(s): PROBNP in the last 8760 hours. HbA1C: No results for input(s): HGBA1C in the last 72 hours. CBG: Recent Labs  Lab 12/25/17 0800 12/25/17 1153 12/27/17 0026 12/27/17 0722 12/27/17 1130  GLUCAP 107* 109* 83 87 102*   Lipid Profile: No results for input(s): CHOL, HDL, LDLCALC, TRIG, CHOLHDL, LDLDIRECT in the last 72 hours. Thyroid Function Tests: No results for input(s): TSH, T4TOTAL, FREET4, T3FREE, THYROIDAB in the last 72 hours. Anemia Panel: No results for input(s): VITAMINB12, FOLATE, FERRITIN, TIBC, IRON, RETICCTPCT in the last 72 hours. Urine analysis:    Component Value Date/Time   COLORURINE AMBER (A) 12/26/2017 2140   APPEARANCEUR CLOUDY (A) 12/26/2017 2140   LABSPEC 1.016 12/26/2017 2140   PHURINE 5.0 12/26/2017 2140    GLUCOSEU NEGATIVE 12/26/2017 2140   HGBUR LARGE (A) 12/26/2017 2140   HGBUR trace-lysed 02/07/2008 1101   BILIRUBINUR NEGATIVE 12/26/2017 2140   KETONESUR NEGATIVE 12/26/2017 2140   PROTEINUR 100 (A) 12/26/2017 2140   UROBILINOGEN 0.2 02/07/2008 1101   NITRITE NEGATIVE 12/26/2017 2140   LEUKOCYTESUR SMALL (A) 12/26/2017 2140   Sepsis Labs: @LABRCNTIP (procalcitonin:4,lacticidven:4)  ) Recent Results (from the past 240 hour(s))  Culture, Urine     Status: Abnormal   Collection Time: 12/22/17  5:50 PM  Result Value Ref Range Status   Specimen Description   Final    URINE, CATHETERIZED Performed at [facility], [address], [place], [place] [address]    Special Requests   Final  NONE Performed at [facility], [address] [place], [place] [address]    Culture >=100,000 COLONIES/mL PSEUDOMONAS AERUGINOSA (A)  Final   Report Status 12/25/2017 FINAL  Final   Organism ID, Bacteria PSEUDOMONAS AERUGINOSA (A)  Final      Susceptibility   Pseudomonas aeruginosa - MIC*    CEFTAZIDIME 4 SENSITIVE Sensitive     CIPROFLOXACIN <=0.25 SENSITIVE Sensitive     GENTAMICIN 2 SENSITIVE Sensitive     IMIPENEM 2 SENSITIVE Sensitive     PIP/TAZO 8 SENSITIVE Sensitive     CEFEPIME 2 SENSITIVE Sensitive     * >=100,000 COLONIES/mL PSEUDOMONAS AERUGINOSA  MRSA PCR Screening     Status: None   Collection Time: 12/23/17  4:33 AM  Result Value Ref Range Status   MRSA by PCR NEGATIVE NEGATIVE Final    Comment:        The GeneXpert MRSA Assay (FDA approved for NASAL specimens only), is one component of a comprehensive MRSA colonization surveillance program. It is not intended to diagnose MRSA infection nor to guide or monitor treatment for MRSA infections. Performed at [facility], [address], [place], [place] [address]          Radiology Studies: Ct Abdomen Pelvis Wo Contrast  Result Date: 12/26/2017 CLINICAL DATA:  84 year old male with acute abdominal pain, nausea and  vomiting today. EXAM: CT ABDOMEN AND PELVIS WITHOUT CONTRAST TECHNIQUE: Multidetector CT imaging of the abdomen and pelvis was performed following the standard protocol without IV contrast. COMPARISON:  12/22/2017 and prior CTs FINDINGS: Please note that parenchymal abnormalities may be missed without intravenous contrast. Lower chest: No acute abnormality.  Cardiomegaly identified. Hepatobiliary: Probable hepatic cysts again noted. A 1.5 cm gallstone is identified without CT evidence of acute cholecystitis. No biliary dilatation. Pancreas: Equivocal minimal stranding along the pancreatic head noted and could represent mild pancreatitis. No other pancreatic abnormalities noted. Spleen: Unremarkable Adrenals/Urinary Tract: The kidneys and adrenal glands are unremarkable except for bilateral renal cortical atrophy. No hydronephrosis or urinary calculi. A Foley catheter is present within the bladder. Bladder diverticula again identified. Stomach/Bowel: Stomach is within normal limits. Appendix appears normal. No evidence of bowel wall thickening, distention, or inflammatory changes. Colonic diverticulosis noted without evidence of diverticulitis. Vascular/Lymphatic: Aortic atherosclerosis. No enlarged abdominal or pelvic lymph nodes. Reproductive: Prostate is unremarkable. Other: No free fluid, focal collection or pneumoperitoneum. Musculoskeletal: No acute bony abnormality. Bilateral L5 pars defects with grade 1 anterolisthesis of L5 on S1 again noted. No suspicious bony lesions identified. IMPRESSION: 1. Equivocal minimal stranding along the pancreatic head which could represent mild pancreatitis. No acute fluid collection. 2. Cholelithiasis without CT evidence of acute cholecystitis 3. Cardiomegaly 4. Unchanged bilateral L5 pars defects with grade 1 spondylolisthesis at L5-S1 5.  Aortic Atherosclerosis (ICD10-I70.0). Electronically Signed   By: Margarette Canada M.D.   On: 12/26/2017 21:41   Dg Chest 2 View  Result  Date: 12/26/2017 CLINICAL DATA:  Nausea and vomiting EXAM: CHEST - 2 VIEW COMPARISON:  12/22/2017, 08/26/2010 FINDINGS: Post sternotomy changes. No pleural effusion. Cardiomegaly. No focal airspace disease. No pneumothorax. Mild ankylosis of the spine with mild mid to lower vertebral wedging. IMPRESSION: No active cardiopulmonary disease.  Cardiomegaly Electronically Signed   By: Donavan Foil M.D.   On: 12/26/2017 21:41        Scheduled Meds: . amLODipine  2.5 mg Oral Daily  . apixaban  2.5 mg Oral BID  . B-complex with vitamin C  1 tablet Oral Daily  .  cholecalciferol  1,000 Units Oral Once per day on Mon Thu  . finasteride  5 mg Oral Daily  . insulin aspart  0-5 Units Subcutaneous QHS  . insulin aspart  0-9 Units Subcutaneous TID WC  . liver oil-zinc oxide   Topical TID BM  . metoprolol tartrate  12.5 mg Oral BID  . nutrition supplement (JUVEN)  1 packet Oral BID BM  . omega-3 acid ethyl esters  1,000 mg Oral Daily  . pentoxifylline  400 mg Oral TID WC  . rosuvastatin  40 mg Oral q1800  . tamsulosin  0.4 mg Oral Daily  . vancomycin  125 mg Oral Q6H   Continuous Infusions: . [START ON 12/28/2017] ceFEPime (MAXIPIME) IV       LOS: 1 day    Time spent: 25 minutes.    Lelon Frohlich, MD [facility] Pager [phone]  If 7PM-7AM, please contact night-coverage [email or web address] Password [email or web address] 12/27/2017, 4:07 PM

## 2017-12-28 ENCOUNTER — Inpatient Hospital Stay
Admission: RE | Admit: 2017-12-28 | Discharge: 2018-01-22 | Disposition: A | Discharge status: Home / Self Care | Payer: PPO | Source: Ambulatory Visit | Attending: Internal Medicine | Admitting: Internal Medicine

## 2017-12-28 DIAGNOSIS — N390 Urinary tract infection, site not specified: Secondary | ICD-10-CM | POA: Diagnosis not present

## 2017-12-28 DIAGNOSIS — R262 Difficulty in walking, not elsewhere classified: Secondary | ICD-10-CM | POA: Diagnosis not present

## 2017-12-28 DIAGNOSIS — E1122 Type 2 diabetes mellitus with diabetic chronic kidney disease: Secondary | ICD-10-CM | POA: Diagnosis not present

## 2017-12-28 DIAGNOSIS — I739 Peripheral vascular disease, unspecified: Secondary | ICD-10-CM | POA: Diagnosis not present

## 2017-12-28 DIAGNOSIS — I2510 Atherosclerotic heart disease of native coronary artery without angina pectoris: Secondary | ICD-10-CM | POA: Diagnosis not present

## 2017-12-28 DIAGNOSIS — Z7901 Long term (current) use of anticoagulants: Secondary | ICD-10-CM | POA: Diagnosis not present

## 2017-12-28 DIAGNOSIS — N183 Chronic kidney disease, stage 3 (moderate): Secondary | ICD-10-CM | POA: Diagnosis not present

## 2017-12-28 DIAGNOSIS — T83511D Infection and inflammatory reaction due to indwelling urethral catheter, subsequent encounter: Secondary | ICD-10-CM | POA: Diagnosis not present

## 2017-12-28 DIAGNOSIS — E875 Hyperkalemia: Secondary | ICD-10-CM | POA: Diagnosis not present

## 2017-12-28 DIAGNOSIS — Z974 Presence of external hearing-aid: Secondary | ICD-10-CM | POA: Diagnosis not present

## 2017-12-28 DIAGNOSIS — E781 Pure hyperglyceridemia: Secondary | ICD-10-CM | POA: Diagnosis not present

## 2017-12-28 DIAGNOSIS — R112 Nausea with vomiting, unspecified: Secondary | ICD-10-CM | POA: Diagnosis not present

## 2017-12-28 DIAGNOSIS — N138 Other obstructive and reflux uropathy: Secondary | ICD-10-CM | POA: Diagnosis not present

## 2017-12-28 DIAGNOSIS — R279 Unspecified lack of coordination: Secondary | ICD-10-CM | POA: Diagnosis not present

## 2017-12-28 DIAGNOSIS — A0472 Enterocolitis due to Clostridium difficile, not specified as recurrent: Secondary | ICD-10-CM | POA: Diagnosis not present

## 2017-12-28 DIAGNOSIS — R001 Bradycardia, unspecified: Secondary | ICD-10-CM | POA: Diagnosis not present

## 2017-12-28 DIAGNOSIS — R339 Retention of urine, unspecified: Secondary | ICD-10-CM | POA: Diagnosis not present

## 2017-12-28 DIAGNOSIS — E1152 Type 2 diabetes mellitus with diabetic peripheral angiopathy with gangrene: Secondary | ICD-10-CM | POA: Diagnosis not present

## 2017-12-28 DIAGNOSIS — S88112D Complete traumatic amputation at level between knee and ankle, left lower leg, subsequent encounter: Secondary | ICD-10-CM | POA: Diagnosis not present

## 2017-12-28 DIAGNOSIS — R338 Other retention of urine: Secondary | ICD-10-CM | POA: Diagnosis not present

## 2017-12-28 DIAGNOSIS — N401 Enlarged prostate with lower urinary tract symptoms: Secondary | ICD-10-CM | POA: Diagnosis not present

## 2017-12-28 DIAGNOSIS — G4739 Other sleep apnea: Secondary | ICD-10-CM | POA: Diagnosis not present

## 2017-12-28 DIAGNOSIS — N179 Acute kidney failure, unspecified: Secondary | ICD-10-CM | POA: Diagnosis not present

## 2017-12-28 DIAGNOSIS — I10 Essential (primary) hypertension: Secondary | ICD-10-CM | POA: Diagnosis not present

## 2017-12-28 DIAGNOSIS — Z89512 Acquired absence of left leg below knee: Secondary | ICD-10-CM | POA: Diagnosis not present

## 2017-12-28 DIAGNOSIS — D72829 Elevated white blood cell count, unspecified: Secondary | ICD-10-CM | POA: Diagnosis not present

## 2017-12-28 DIAGNOSIS — D649 Anemia, unspecified: Secondary | ICD-10-CM | POA: Diagnosis not present

## 2017-12-28 DIAGNOSIS — I481 Persistent atrial fibrillation: Secondary | ICD-10-CM | POA: Diagnosis not present

## 2017-12-28 DIAGNOSIS — B965 Pseudomonas (aeruginosa) (mallei) (pseudomallei) as the cause of diseases classified elsewhere: Secondary | ICD-10-CM | POA: Diagnosis not present

## 2017-12-28 DIAGNOSIS — G9009 Other idiopathic peripheral autonomic neuropathy: Secondary | ICD-10-CM | POA: Diagnosis not present

## 2017-12-28 DIAGNOSIS — D631 Anemia in chronic kidney disease: Secondary | ICD-10-CM | POA: Diagnosis not present

## 2017-12-28 DIAGNOSIS — E871 Hypo-osmolality and hyponatremia: Secondary | ICD-10-CM | POA: Diagnosis not present

## 2017-12-28 DIAGNOSIS — Z4781 Encounter for orthopedic aftercare following surgical amputation: Secondary | ICD-10-CM | POA: Diagnosis not present

## 2017-12-28 LAB — URINE CULTURE: Culture: NO GROWTH

## 2017-12-28 LAB — GLUCOSE, CAPILLARY
GLUCOSE-CAPILLARY: 96 mg/dL (ref 65–99)
GLUCOSE-CAPILLARY: 120 mg/dL — AB (ref 65–99)
Glucose-Capillary: 141 mg/dL — ABNORMAL HIGH (ref 65–99)

## 2017-12-28 LAB — BASIC METABOLIC PANEL
Anion gap: 6 (ref 5–15)
BUN: 28 mg/dL — AB (ref 6–20)
CHLORIDE: 104 mmol/L (ref 101–111)
CO2: 26 mmol/L (ref 22–32)
Calcium: 9 mg/dL (ref 8.9–10.3)
Creatinine, Ser: 1.1 mg/dL (ref 0.61–1.24)
GFR, EST NON AFRICAN AMERICAN: 58 mL/min — AB (ref >60)
Glucose, Bld: 104 mg/dL — ABNORMAL HIGH (ref 65–99)
POTASSIUM: 5.2 mmol/L — AB (ref 3.5–5.1)
SODIUM: 136 mmol/L (ref 135–145)

## 2017-12-28 NOTE — Discharge Summary (Signed)
Physician Discharge Summary  Eric [name]. ZOX:[id number] DOB: 1928-07-25 DOA: 12/26/2017  PCP: [name], MD  Admit date: 12/26/2017 Discharge date: 12/28/2017  Time spent: 45 minutes  Recommendations for Outpatient Follow-up:  -To be discharged home today. -Advise follow-up with PCP in 2 weeks.  Discharge Diagnoses:  Principal Problem:   AKI (acute kidney injury) ([place]) Active Problems:   Essential hypertension   Coronary artery disease   Persistent atrial fibrillation (HCC)   C. difficile colitis   UTI (urinary tract infection)   Hyponatremia   Normocytic anemia   Nausea and vomiting in adult   Discharge Condition: Stable and improved  Filed Weights   12/26/17 2344 12/27/17 0621 12/28/17 0455  Weight: 73.6 kg (162 lb 4.1 oz) 74.5 kg (164 lb 3.9 oz) 72.8 kg (160 lb 7.9 oz)    History of present illness:  As per Dr. Myna Hidalgo on 5/19: Eric Sauer Arvle Grabe. is a 84 y.o. male with medical history significant for persistent atrial fibrillation on Eliquis, coronary artery disease, diet-controlled diabetes mellitus, recent left BKA, recent C. difficile colitis with diarrhea resolved, and recent pseudomonal UTI, discharged to SNF yesterday, now returning with nausea and vomiting.  Patient was discharged from the hospital yesterday after treatment for UTI with culture growing Pseudomonas.  He had been treated with cefepime in the hospital beginning 12/22/2017 and was discharged yesterday with ciprofloxacin.  He had an uneventful morning before developing acute nausea with nonbloody vomiting this afternoon.  He denies fevers, chills, or abdominal pain.  He has not had any diarrhea in several days.  There has been several episodes of vomiting and he was unable to tolerate his oral medications at the SNF.  ED Course: Upon arrival to the ED, patient is found to be afebrile, saturating well on room air, and with vitals otherwise stable.  EKG features atrial fibrillation and chest  x-ray is negative for acute cardiopulmonary disease.  Chemistry panel is notable for sodium of 129, glucose 211, and creatinine of 1.36, up from 0.79 yesterday.  CBC is notable for stable leukocytosis at 16,000 and a stable normocytic anemia with hemoglobin 9.9.  Lactic acid is 2.0, troponin is undetectable, and lipase is 66.  CT of the abdomen and pelvis features possible minimal stranding about the pancreatic head.  Patient was given 500 cc normal saline, Pepcid, and Zofran in the ED.  He remains hemodynamically stable, in no apparent respiratory distress, and will be admitted for ongoing evaluation and management of nausea and vomiting with acute kidney injury.    Hospital Course:   Acute renal failure -Creatinine was 1.36 on admission, today is down to 1.10 on DC. -Suspect due to prerenal azotemia and dehydration with acute GI losses. -No abnormalities on CT scan.  Vomiting -As above, suspect related to stomach upset from Cipro, less likely acute viral gastroenteritis.  Only one episode of emesis throughout his entire hospitalization and none over the past 24 hours.  Pseudomonas UTI - has completed antibiotic course.  C. difficile colitis -Diarrhea has resolved, continue oral vancomycin to complete course until 5/23..  Type 2 diabetes -Well-controlled.  Procedures:  None   Consultations:  None  Discharge Instructions  Discharge Instructions    Diet - low sodium heart healthy   Complete by:  As directed    Increase activity slowly   Complete by:  As directed      Allergies as of 12/28/2017      Reactions   Isosorbide Mononitrate Other (  See Comments)   Unknown, doesn't remember    Ramipril Cough   Quinidine Gluconate Rash      Medication List    STOP taking these medications   ciprofloxacin 500 MG tablet Commonly known as:  CIPRO   nitroGLYCERIN 0.2 mg/hr patch Commonly known as:  NITRODUR - Dosed in mg/24 hr     TAKE these medications   acetaminophen  325 MG tablet Commonly known as:  TYLENOL Take 2 tablets (650 mg total) by mouth every 6 (six) hours as needed for mild pain (or temp >/= 101 F).   amLODipine 2.5 MG tablet Commonly known as:  NORVASC Take 1 tablet (2.5 mg total) by mouth daily.   apixaban 2.5 MG Tabs tablet Commonly known as:  ELIQUIS Take 1 tablet (2.5 mg total) by mouth 2 (two) times daily.   B Complex-B12 Tabs Take 1 tablet by mouth daily.   calcium carbonate 500 MG chewable tablet Commonly known as:  TUMS - dosed in mg elemental calcium Chew 1 tablet by mouth daily as needed for indigestion or heartburn.   cholecalciferol 1000 units tablet Commonly known as:  VITAMIN D Take 1,000 Units by mouth 2 (two) times a week. Takes on Mon and Fri   Cinnamon 500 MG capsule Take 1,000 mg by mouth 2 (two) times daily.   DAILY MULTIVITAMIN PO Take 1 tablet by mouth daily.   fenofibrate 160 MG tablet Take 1 tablet (160 mg total) by mouth daily.   finasteride 5 MG tablet Commonly known as:  PROSCAR Take 1 tablet (5 mg total) by mouth daily.   fish oil-omega-3 fatty acids 1000 MG capsule Take 1,000 mg by mouth daily.   glucose blood test strip Use as instructed   lisinopril 20 MG tablet Commonly known as:  PRINIVIL,ZESTRIL Take 1 tablet (20 mg total) by mouth daily.   liver oil-zinc oxide 40 % ointment Commonly known as:  DESITIN Apply topically 3 (three) times daily between meals.   metoprolol tartrate 25 MG tablet Commonly known as:  LOPRESSOR Take 0.5 tablets (12.5 mg total) by mouth 2 (two) times daily.   nutrition supplement (JUVEN) Pack Take 1 packet by mouth 2 (two) times daily between meals.   ondansetron 4 MG tablet Commonly known as:  ZOFRAN Take 4 mg by mouth every 6 (six) hours as needed for nausea or vomiting.   ONE TOUCH ULTRA SYSTEM KIT w/Device Kit 1 kit by Does not apply route once.   ONETOUCH DELICA LANCETS FINE Misc check sugar once daily   pentoxifylline 400 MG CR  tablet Commonly known as:  TRENTAL Take 1 tablet (400 mg total) by mouth 3 (three) times daily with meals.   rosuvastatin 40 MG tablet Commonly known as:  CRESTOR Take 1 tablet (40 mg total) by mouth daily.   tamsulosin 0.4 MG Caps capsule Commonly known as:  FLOMAX Take 1 capsule (0.4 mg total) by mouth daily.   vancomycin 50 mg/mL oral solution Commonly known as:  VANCOCIN Take 2.5 mLs (125 mg total) by mouth every 6 (six) hours for 5 days.   VENELEX Oint Apply 1 application topically 3 (three) times daily. Applied at every shift day, evening, and night to bilateral buttocks/sacrum area      Allergies  Allergen Reactions  . Isosorbide Mononitrate Other (See Comments)    Unknown, doesn't remember   . Ramipril Cough  . Quinidine Gluconate Rash      The results of significant diagnostics from this hospitalization (including imaging, microbiology,  ancillary and laboratory) are listed below for reference.    Significant Diagnostic Studies: Ct Abdomen Pelvis Wo Contrast  Result Date: 12/26/2017 CLINICAL DATA:  84 year old male with acute abdominal pain, nausea and vomiting today. EXAM: CT ABDOMEN AND PELVIS WITHOUT CONTRAST TECHNIQUE: Multidetector CT imaging of the abdomen and pelvis was performed following the standard protocol without IV contrast. COMPARISON:  12/22/2017 and prior CTs FINDINGS: Please note that parenchymal abnormalities may be missed without intravenous contrast. Lower chest: No acute abnormality.  Cardiomegaly identified. Hepatobiliary: Probable hepatic cysts again noted. A 1.5 cm gallstone is identified without CT evidence of acute cholecystitis. No biliary dilatation. Pancreas: Equivocal minimal stranding along the pancreatic head noted and could represent mild pancreatitis. No other pancreatic abnormalities noted. Spleen: Unremarkable Adrenals/Urinary Tract: The kidneys and adrenal glands are unremarkable except for bilateral renal cortical atrophy. No  hydronephrosis or urinary calculi. A Foley catheter is present within the bladder. Bladder diverticula again identified. Stomach/Bowel: Stomach is within normal limits. Appendix appears normal. No evidence of bowel wall thickening, distention, or inflammatory changes. Colonic diverticulosis noted without evidence of diverticulitis. Vascular/Lymphatic: Aortic atherosclerosis. No enlarged abdominal or pelvic lymph nodes. Reproductive: Prostate is unremarkable. Other: No free fluid, focal collection or pneumoperitoneum. Musculoskeletal: No acute bony abnormality. Bilateral L5 pars defects with grade 1 anterolisthesis of L5 on S1 again noted. No suspicious bony lesions identified. IMPRESSION: 1. Equivocal minimal stranding along the pancreatic head which could represent mild pancreatitis. No acute fluid collection. 2. Cholelithiasis without CT evidence of acute cholecystitis 3. Cardiomegaly 4. Unchanged bilateral L5 pars defects with grade 1 spondylolisthesis at L5-S1 5.  Aortic Atherosclerosis (ICD10-I70.0). Electronically Signed   By: Margarette Canada M.D.   On: 12/26/2017 21:41   Ct Abdomen Pelvis Wo Contrast  Result Date: 12/22/2017 CLINICAL DATA:  Fever and elevated white count EXAM: CT ABDOMEN AND PELVIS WITHOUT CONTRAST TECHNIQUE: Multidetector CT imaging of the abdomen and pelvis was performed following the standard protocol without IV contrast. COMPARISON:  04/21/2016 FINDINGS: Lower chest: Lung bases demonstrate no acute consolidation or pleural effusion. Lower sternotomy changes. Cardiomegaly with coronary vascular calcification. Hepatobiliary: Cyst in the left hepatic lobe. Additional subcentimeter hypodensities in the right lobe too small to further characterize. Calcified stone in the gallbladder. No biliary dilatation. Pancreas: Unremarkable. No pancreatic ductal dilatation or surrounding inflammatory changes. Spleen: Normal in size without focal abnormality. Adrenals/Urinary Tract: Adrenal glands are  within normal limits. No hydronephrosis. Air in the bladder. Foley catheter in the bladder. Thick-walled appearance of the bladder with irregular margin. Stomach/Bowel: Stomach is within normal limits. Appendix appears normal. No evidence of bowel wall thickening, distention, or inflammatory changes. Sigmoid and descending colon diverticular disease without acute inflammation Vascular/Lymphatic: Moderate aortic atherosclerosis. No aneurysmal dilatation. No significantly enlarged lymph nodes Reproductive: Enlarged prostate gland Other: Negative for free air or free fluid. Musculoskeletal: Degenerative changes. No acute or suspicious abnormality. IMPRESSION: 1. Thick wall irregular appearance of the bladder, which may be secondary to cystitis or chronic obstruction. Bladder is decompressed by Foley catheter. 2. Otherwise no CT evidence for acute intra-abdominal or pelvic abnormality. 3. Gallstone 4. Diverticular disease of the colon without acute inflammation 5. Cardiomegaly Electronically Signed   By: Donavan Foil M.D.   On: 12/22/2017 22:12   Dg Chest 2 View  Result Date: 12/26/2017 CLINICAL DATA:  Nausea and vomiting EXAM: CHEST - 2 VIEW COMPARISON:  12/22/2017, 08/26/2010 FINDINGS: Post sternotomy changes. No pleural effusion. Cardiomegaly. No focal airspace disease. No pneumothorax. Mild ankylosis of the spine with mild  mid to lower vertebral wedging. IMPRESSION: No active cardiopulmonary disease.  Cardiomegaly Electronically Signed   By: Donavan Foil M.D.   On: 12/26/2017 21:41   Dg Chest 2 View  Result Date: 12/22/2017 CLINICAL DATA:  Fever with weakness and nausea EXAM: CHEST - 2 VIEW COMPARISON:  08/26/2010 FINDINGS: Post sternotomy changes. Mild hyperinflation. No focal airspace disease or pleural effusion. Mild cardiomegaly. No pneumothorax. Mild degenerative changes of the spine. IMPRESSION: No active cardiopulmonary disease.  Mild cardiomegaly. Electronically Signed   By: Donavan Foil M.D.    On: 12/22/2017 20:05    Microbiology: Recent Results (from the past 240 hour(s))  Culture, Urine     Status: Abnormal   Collection Time: 12/22/17  5:50 PM  Result Value Ref Range Status   Specimen Description   Final    URINE, CATHETERIZED Performed at [facility], [address], [place], [place] [address]    Special Requests   Final    NONE Performed at [facility], [address], [place], [place] [address]    Culture >=100,000 COLONIES/mL PSEUDOMONAS AERUGINOSA (A)  Final   Report Status 12/25/2017 FINAL  Final   Organism ID, Bacteria PSEUDOMONAS AERUGINOSA (A)  Final      Susceptibility   Pseudomonas aeruginosa - MIC*    CEFTAZIDIME 4 SENSITIVE Sensitive     CIPROFLOXACIN <=0.25 SENSITIVE Sensitive     GENTAMICIN 2 SENSITIVE Sensitive     IMIPENEM 2 SENSITIVE Sensitive     PIP/TAZO 8 SENSITIVE Sensitive     CEFEPIME 2 SENSITIVE Sensitive     * >=100,000 COLONIES/mL PSEUDOMONAS AERUGINOSA  MRSA PCR Screening     Status: None   Collection Time: 12/23/17  4:33 AM  Result Value Ref Range Status   MRSA by PCR NEGATIVE NEGATIVE Final    Comment:        The GeneXpert MRSA Assay (FDA approved for NASAL specimens only), is one component of a comprehensive MRSA colonization surveillance program. It is not intended to diagnose MRSA infection nor to guide or monitor treatment for MRSA infections. Performed at [facility], [address], [place], [place] [address]   Urine culture     Status: None   Collection Time: 12/26/17  9:40 PM  Result Value Ref Range Status   Specimen Description   Final    URINE, CLEAN CATCH Performed at [facility], [address], [place], [place] [address]    Special Requests   Final    NONE Performed at [facility], [address], [place], [place] [address]    Culture   Final    NO GROWTH Performed at [facility], [place] [address] [place], [place] [address]    Report Status 12/28/2017 FINAL  Final     Labs: Basic Metabolic  Panel: Recent Labs  Lab 12/24/17 0529 12/25/17 0715 12/26/17 2041 12/27/17 0457 12/28/17 0516  NA 130* 133* 129* 132* 136  K 4.3 4.7 4.7 4.9 5.2*  CL 101 100* 95* 101 104  CO2 _0 GLUCOSE 180* 115* 211* 103* 104*  BUN 21* 19 36* 34* 28*  CREATININE 0.88 0.79 1.36* 1.23 1.10  CALCIUM 8.2* 8.8* 9.1 8.6* 9.0  MG 1.4* 1.5*  --   --   --    Liver Function Tests: Recent Labs  Lab 12/22/17 2035 12/24/17 0529 12/25/17 0715 12/26/17 2041 12/27/17 0457  AST 53* 42* 60* 70* 50*  ALT 32 27 35 44 35  ALKPHOS 76 69  82 98 77  BILITOT 0.9 0.5 0.6 0.6 0.6  PROT 6.0* 5.4* 6.3* 6.2* 5.5*  ALBUMIN 2.5* 2.1* 2.6* 2.6* 2.3*   Recent Labs  Lab 12/26/17 2041  LIPASE 66*   No results for input(s): AMMONIA in the last 168 hours. CBC: Recent Labs  Lab 12/22/17 2035 12/23/17 0700 12/24/17 0529 12/25/17 0715 12/26/17 2041  WBC 30.7* 28.1* 19.4* 15.9* 16.0*  NEUTROABS 24.9* 24.5* 15.8* 11.9* 12.9*  HGB 9.4* 9.3* 8.7* 10.4* 9.9*  HCT 28.3* 27.6* 26.5* 32.6* 30.2*  MCV 92.2 91.4 92.7 92.9 91.8  PLT 315 306 303 364 345   Cardiac Enzymes: Recent Labs  Lab 12/26/17 2041  TROPONINI <0.03   BNP: BNP (last 3 results) No results for input(s): BNP in the last 8760 hours.  ProBNP (last 3 results) No results for input(s): PROBNP in the last 8760 hours.  CBG: Recent Labs  Lab 12/27/17 1130 12/27/17 1627 12/27/17 2028 12/28/17 0726 12/28/17 1127  GLUCAP 102* 100* 94 96 120*       Signed:  [facility] Pager: [phone] 12/28/2017, 12:23 PM

## 2017-12-28 NOTE — Progress Notes (Signed)
Report called to the [facility], Patient will eat supper here, then will be taken to the [facility]

## 2017-12-28 NOTE — Clinical Social Work Note (Signed)
Patient's authorization number is [id number]. Medical director approved SNF and skilled him based on his foot wound.   Facility aware. Family aware.   LCSW signing off.     Nozomi Mettler, Clydene Pugh, LCSW

## 2017-12-28 NOTE — Evaluation (Signed)
Physical Therapy Evaluation Patient Details Name: Eric [name]. MRN: [id number] DOB: April 19, 1928 Today's Date: 12/28/2017   History of Present Illness  Eric [name]. is a 84 y.o. male with medical history significant for coronary artery disease, atrial fibrillation on Eliquis, chronic kidney disease stage II, diet-controlled diabetes mellitus, gangrene of the left foot status post left BKA on 12/15/2017, and recent C. difficile colitis, now presenting from his SNF for evaluation of fever and lethargy.  Patient was discharged from the hospital to SNF yesterday after management of C. difficile colitis.  His diarrhea had essentially resolved and he had been doing much better until today when he was noted to have fever and lethargy.  He denies abdominal pain, but reports nausea without vomiting.  He has a general lethargy, but denies focal numbness or weakness.  He continues oral vancomycin for C. difficile.  Was evaluated by his vascular surgeon 2 days ago and the BKA stump was healing well at that time.    Clinical Impression  Patient was able to perform sit-to-stand transfer for first time since Lt BKA surgery. Min guard for bed mobility; min guard to min assist for safety with lateral scoots; min to mod assist for sit-to-stands limited by muscle weakness and fatigue. Pt admitted with above diagnosis. Pt currently with functional limitations due to the deficits listed below (see PT Problem List).  Pt will benefit from skilled PT to increase their independence and safety with mobility to allow discharge to the venue listed below.       Follow Up Recommendations SNF;Supervision for mobility/OOB    Equipment Recommendations  None recommended by PT    Recommendations for Other Services       Precautions / Restrictions Precautions Precautions: Fall Restrictions Weight Bearing Restrictions: Yes LLE Weight Bearing: Non weight bearing      Mobility  Bed Mobility Overal bed mobility:  Needs Assistance Bed Mobility: Supine to Sit     Supine to sit: Min guard        Transfers Overall transfer level: Needs assistance Equipment used: None;Rolling walker (2 wheeled)(RW for sit-to-stand) Transfers: Lateral/Scoot Transfers;Sit to/from Stand Sit to Stand: Mod assist        Lateral/Scoot Transfers: Min guard General transfer comment: Pt able to scoot today with no physcial assist but min gaurd for balance; sit-to-stand x4 w/ one complete stand for 30 seconds. Other 3 unable to attain trunk extension.  Ambulation/Gait                Stairs            Wheelchair Mobility    Modified Rankin (Stroke Patients Only)       Balance Overall balance assessment: Needs assistance Sitting-balance support: Feet supported;Feet unsupported Sitting balance-Leahy Scale: Fair Sitting balance - Comments: continues to need min gaurd for transfers due to balance with lateral scoot. good safety awareness   Standing balance support: Bilateral upper extremity supported;During functional activity Standing balance-Leahy Scale: Poor                               Pertinent Vitals/Pain Pain Assessment: No/denies pain    Home Living Family/patient expects to be discharged to:: Skilled nursing facility Living Arrangements: Spouse/significant other Available Help at Discharge: Family Type of Home: House Home Access: Level entry     Home Layout: Laundry or work area in [place]: Kasandra Knudsen - single point;Walker - 4 wheels;Bedside commode;Shower  seat;Grab bars - tub/shower Additional Comments: level entry walk in basement with chair lift to main floor.     Prior Function Level of Independence: Independent with assistive device(s)         Comments: hosuehold, short distanced community with cane, drives     Hand Dominance   Dominant Hand: Right    Extremity/Trunk Assessment        Lower Extremity Assessment LLE Deficits / Details:  grossly -3/5; Lt BKA    Cervical / Trunk Assessment Cervical / Trunk Assessment: Normal  Communication   Communication: No difficulties  Cognition Arousal/Alertness: Awake/alert Behavior During Therapy: WFL for tasks assessed/performed Overall Cognitive Status: Within Functional Limits for tasks assessed                                        General Comments      Exercises     Assessment/Plan    PT Assessment Patient needs continued PT services  PT Problem List Decreased strength;Decreased activity tolerance;Decreased balance;Decreased mobility;Decreased knowledge of use of DME       PT Treatment Interventions Gait training;Functional mobility training;Therapeutic activities;Therapeutic exercise;Patient/family education;Wheelchair mobility training    PT Goals (Current goals can be found in the Care Plan section)       Frequency Min 4X/week   Barriers to discharge        Co-evaluation               AM-PAC PT "6 Clicks" Daily Activity  Outcome Measure Difficulty turning over in bed (including adjusting bedclothes, sheets and blankets)?: A Little Difficulty moving from lying on back to sitting on the side of the bed? : A Little Difficulty sitting down on and standing up from a chair with arms (e.g., wheelchair, bedside commode, etc,.)?: A Lot Help needed moving to and from a bed to chair (including a wheelchair)?: Total Help needed walking in hospital room?: Total Help needed climbing 3-5 steps with a railing? : Total 6 Click Score: 11    End of Session Equipment Utilized During Treatment: Gait belt Activity Tolerance: Patient tolerated treatment well;Patient limited by fatigue Patient left: with call bell/phone within reach(in bathroom on commode.) Nurse Communication: Mobility status PT Visit Diagnosis: Unsteadiness on feet (R26.81);Other abnormalities of gait and mobility (R26.89);Muscle weakness (generalized) (M62.81)    Time:  1030-1100 PT Time Calculation (min) (ACUTE ONLY): 30 min   Charges:   PT Evaluation $PT Eval Moderate Complexity: 1 Mod PT Treatments $Therapeutic Activity: 8-22 mins   PT G Codes:        Nivek Powley D. Hartnett-Rands, MS, PT Per [place] [phone] 12/28/2017, 11:11 AM

## 2017-12-28 NOTE — Clinical Social Work Note (Signed)
Pt discharged to [facility] over the weekend and was re-admitted the next day. Pt now ready for dc back to [facility]. Awaiting insurance re-authorization and then pt can dc.   Message left for Kerri at [facility]. Will update RN.

## 2017-12-28 NOTE — Plan of Care (Signed)
  Problem: Acute Rehab PT Goals(only PT should resolve) Goal: Pt will Roll Supine to Side 12/28/2017 1122 by Hartnett-Rands, Pamala Hurry, PT Flowsheets (Taken 12/28/2017 1122) Pt will Roll Supine to Side: with supervision 12/28/2017 1113 by Hartnett-Rands, Pamala Hurry, PT Outcome: Progressing Goal: Pt Will Go Supine/Side To Sit 12/28/2017 1122 by Hartnett-Rands, Pamala Hurry, [place] (Taken 12/28/2017 1122) Pt will go Supine/Side to Sit: with supervision 12/28/2017 1113 by Hartnett-Rands, Pamala Hurry, PT Outcome: Progressing Goal: Pt Will Go Sit To Supine/Side 12/28/2017 1122 by Hartnett-Rands, Pamala Hurry, [place] (Taken 12/28/2017 1122) Pt will go Sit to Supine/Side: with supervision 12/28/2017 1113 by Hartnett-Rands, Pamala Hurry, PT Outcome: Progressing Goal: Patient Will Perform Sitting Balance 12/28/2017 1122 by Jeannie Done, [place] (Taken 12/28/2017 1122) Patient will perform sitting balance: with min guard assist 12/28/2017 1113 by Hartnett-Rands, Pamala Hurry, PT Outcome: Progressing Goal: Patient Will Transfer Sit To/From Stand 12/28/2017 1122 by Jeannie Done, [place] (Taken 12/28/2017 1122) Patient will transfer sit to/from stand: with minimal assist 12/28/2017 1113 by Hartnett-Rands, Pamala Hurry, PT Outcome: Progressing Goal: Pt Will Transfer Bed To Chair/Chair To Bed 12/28/2017 1122 by Hartnett-Rands, Pamala Hurry, [place] (Taken 12/28/2017 1122) Pt will Transfer Bed to Chair/Chair to Bed: min guard assist Note:  Lateral scoots  12/28/2017 1113 by Hartnett-Rands, Pamala Hurry, PT Outcome: Progressing Goal: Pt/caregiver will Perform Home Exercise Program 12/28/2017 1122 by Jeannie Done, [place] (Taken 12/28/2017 1122) Pt/caregiver will Perform Home Exercise Program: For increased strengthening;For improved balance;With minimal assist 12/28/2017 1113 by Jeannie Done, PT Outcome: [place] D. Hartnett-Rands, MS, PT Per [place] [phone]

## 2017-12-29 ENCOUNTER — Non-Acute Institutional Stay (SKILLED_NURSING_FACILITY): Payer: PPO | Admitting: Internal Medicine

## 2017-12-29 ENCOUNTER — Encounter: Payer: Self-pay | Admitting: Internal Medicine

## 2017-12-29 DIAGNOSIS — A0472 Enterocolitis due to Clostridium difficile, not specified as recurrent: Secondary | ICD-10-CM

## 2017-12-29 DIAGNOSIS — Z89512 Acquired absence of left leg below knee: Secondary | ICD-10-CM | POA: Diagnosis not present

## 2017-12-29 DIAGNOSIS — N179 Acute kidney failure, unspecified: Secondary | ICD-10-CM

## 2017-12-29 DIAGNOSIS — N390 Urinary tract infection, site not specified: Secondary | ICD-10-CM

## 2017-12-29 DIAGNOSIS — I10 Essential (primary) hypertension: Secondary | ICD-10-CM | POA: Diagnosis not present

## 2017-12-29 DIAGNOSIS — E871 Hypo-osmolality and hyponatremia: Secondary | ICD-10-CM | POA: Diagnosis not present

## 2017-12-29 DIAGNOSIS — T83511D Infection and inflammatory reaction due to indwelling urethral catheter, subsequent encounter: Secondary | ICD-10-CM

## 2017-12-29 DIAGNOSIS — R112 Nausea with vomiting, unspecified: Secondary | ICD-10-CM

## 2017-12-29 NOTE — Progress Notes (Signed)
Location:   [facility] Room Number: 133/P Place of Service:  SNF (31) Provider:  Elige Radon, MD  Patient Care Team: [name], MD as PCP - General [facility] Medicine)  Extended Emergency Contact Information Primary Emergency Contact: Kauffmann,Lillie Address: [address]          [place], [place] [address] [name] of [place] Phone: [phone] Mobile Phone: [phone] Relation: Spouse Secondary Emergency Contact: Amarion, [place], [place] [address] [name] of [place] Mobile Phone: [phone] Relation: Son  Code Status:  Full Code Goals of care: Advanced Directive information Advanced Directives 12/29/2017  Does Patient Have a Medical Advance Directive? Yes  Type of Advance Directive (No Data)  Does patient want to make changes to medical advance directive? No - Patient declined  Copy of [place] in Chart? -  Would patient like information on creating a medical advance directive? No - Patient declined     Chief complaint- acute visit follow-up hospitalization for nausea and vomiting with history of UTI- previous history of left BKA secondary to gangrene- also history of C. difficile  HPI:  Pt is a 84 y.o. male seen today status post hospitalization for recurrent nausea and vomiting.  Patient has a medical history including atrial fibrillation coronary artery disease diet-controlled diabetes as well as a recent left BKA and recent C. difficile colitis she is completing a prolonged course of vancomycin.  Also recent pseudomonal UTI.  He was here for a short time initially and was sent back secondary to fever and elevated white count.--And was found to have a Pseudomonas UTI.  He was treated with Cefepime with good results-been discharged on oral Cipro.  However shortly after readmission he developed recurrent nausea and vomiting could not really tolerate anything p.o.  and did go back to the hospital.  He was found to have a rising creatinine of 1.36 and had been apparently 0.79 previously.  Is thought secondary to dehydration with acute GI losses CT scan did not show anything acutely abnormal.  Was thought the vomiting was secondary to the Cipro less likely gastroenteritis- this resolved during his hospitalization.  Regards C. difficile colitis diarrhea also resolved he is on oral vancomycin until later this month.  His other medical issues appear to be stable in the hospital.  He appears to be feeling better today he is up in his wheelchair is still having trouble eating some food he is taking fluids well however.  Currently his left knee amputation site is wrapped he still has staples in place and would like expedient evaluation by surgeon which we are trying to arrange.  Vital signs appear to be stable he is afebrile he is actually up in chair visiting with family and friends today.            Past Medical History:  Diagnosis Date  . Atrial flutter ([place])    s/p RFCA  . CAD (coronary artery disease)    cath 2003, occluded S-RCA, occluded S-Dx, L-LAD ok, s/p PTCA to LAD  . Cataract   . CKD (chronic kidney disease) stage 3, GFR 30-59 ml/min (H[facility])   . Diabetes mellitus    diet controlled  . Hearing aid worn    bilateral  . History of kidney stones   . Long term (current) use of anticoagulants   . Neuromuscular disorder ([place])   . OSA (obstructive sleep apnea) 12/11  very mild, AHI 7/hr  . Persistent atrial fibrillation ([place]) 09/26/2015  . Pure hyperglyceridemia   . PVD (peripheral vascular disease) ([place])    angioplasty of his right lower extremity in [place] by Dr.[name] 2013  . Unspecified essential hypertension   . Wears dentures    full upper and lower   Past Surgical History:  Procedure Laterality Date  . ABDOMINAL AORTOGRAM W/LOWER EXTREMITY N/A 11/15/2017   Procedure: ABDOMINAL AORTOGRAM W/LOWER EXTREMITY;  Surgeon:  [name], MD;  Location: [facility];  Service: Cardiovascular;  Laterality: N/A;  . Adenosine Myoview  3/06   EF 56%, neg. Ischemia  . Adenosine Myoview  02/18/07   nml  . AMPUTATION Left 12/15/2017   Procedure: AMPUTATION BELOW KNEE;  Surgeon: Algernon [name], MD;  Location: [facility] ORS;  Service: Vascular;  Laterality: Left;  . ANGIOPLASTY  1/99   CAD- diogonal with rotational artherectomy  . Arthrectomy     of LAD & PTCA  . BLEPHAROPLASTY Bilateral   . CARDIAC CATHETERIZATION  1/00  . CARDIOVERSION  1/04  . CARDIOVERSION  5/07   hospital- a flutter  . CARPAL TUNNEL RELEASE     ? bilateral  . CATARACT EXTRACTION W/PHACO Right 07/28/2017   Procedure: CATARACT EXTRACTION PHACO AND INTRAOCULAR LENS PLACEMENT ([place]) RIGHT DIABETIC;  Surgeon: [name], MD;  Location: [facility];  Service: Ophthalmology;  Laterality: Right;  Diabetic - diet controlled  . CATARACT EXTRACTION W/PHACO Left 08/18/2017   Procedure: CATARACT EXTRACTION PHACO AND INTRAOCULAR LENS PLACEMENT ([facility]);  Surgeon: [name], MD;  Location: [facility];  Service: Ophthalmology;  Laterality: Left;  DIABETES - oral meds  . COLONOSCOPY W/ BIOPSIES  10/01/06   sigmoid polyp bx neg, 3 years  . CORONARY ANGIOPLASTY  4/03   cutting balloon PTCA pLAD into Diag  . CORONARY ARTERY BYPASS GRAFT  2000   LIMA-LAD, SVG-RCA, SVG-Diag; SVG-Diag & SVG-RCA occluded 2003  . FRACTURE SURGERY    . HAND SURGERY  08/27/09   R thumb procedure wit Scaphoid Gragt and screws, Dr [name]  . NM MYOVIEW LTD  4/11   normal  . PERIPHERAL VASCULAR CATHETERIZATION Right 06/27/2015   Procedure: Lower Extremity Angiography;  Surgeon: Algernon [name], MD;  Location: [facility];  Service: Cardiovascular;  Laterality: Right;  . PERIPHERAL VASCULAR CATHETERIZATION  06/27/2015   Procedure: Lower Extremity Intervention;  Surgeon: Algernon [name], MD;  Location: [facility];  Service: Cardiovascular;;     Allergies  Allergen Reactions  . Isosorbide Mononitrate Other (See Comments)    Unknown, doesn't remember   . Ramipril Cough  . Quinidine Gluconate Rash    Outpatient Encounter Medications as of 12/29/2017  Medication Sig  . acetaminophen (TYLENOL) 325 MG tablet Take 2 tablets (650 mg total) by mouth every 6 (six) hours as needed for mild pain (or temp >/= 101 F).  [name] amLODipine (NORVASC) 2.5 MG tablet Take 1 tablet (2.5 mg total) by mouth daily.  [name] apixaban (ELIQUIS) 2.5 MG TABS tablet Take 1 tablet (2.5 mg total) by mouth 2 (two) times daily.  . B Complex Vitamins (B COMPLEX-B12) TABS Take 1 tablet by mouth daily.   Roseanne Kaufman [place]-Castor Oil (VENELEX) OINT Apply 1 application topically 3 (three) times daily. Applied at every shift day, evening, and night to bilateral buttocks/sacrum area  . Blood Glucose Monitoring Suppl (ONE TOUCH ULTRA SYSTEM KIT) W/DEVICE KIT 1 kit by Does not apply route once.  . calcium carbonate (  TUMS - DOSED IN MG ELEMENTAL CALCIUM) 500 MG chewable tablet Chew 1 tablet by mouth daily as needed for indigestion or heartburn.  . cholecalciferol (VITAMIN D) 1000 UNITS tablet Take 1,000 Units by mouth 2 (two) times a week. Takes on Mon and Fri  . Cinnamon 500 MG capsule Take 1,000 mg by mouth 2 (two) times daily.   . fenofibrate 160 MG tablet Take 1 tablet (160 mg total) by mouth daily.  . finasteride (PROSCAR) 5 MG tablet Take 1 tablet (5 mg total) by mouth daily.  . fish oil-omega-3 fatty acids 1000 MG capsule Take 1,000 mg by mouth daily.   [name] glucose blood test strip Use as instructed  . lisinopril (PRINIVIL,ZESTRIL) 20 MG tablet Take 1 tablet (20 mg total) by mouth daily.  [name] liver oil-zinc oxide (DESITIN) 40 % ointment Apply topically 3 (three) times daily between meals.  . metoprolol tartrate (LOPRESSOR) 25 MG tablet Take 0.5 tablets (12.5 mg total) by mouth 2 (two) times daily.  . Multiple Vitamin (DAILY MULTIVITAMIN PO) Take 1 tablet by mouth daily.    .  nutrition supplement, JUVEN, (JUVEN) PACK Take 1 packet by mouth 2 (two) times daily between meals.  . ondansetron (ZOFRAN) 4 MG tablet Take 4 mg by mouth every 6 (six) hours as needed for nausea or vomiting.  [name] DELICA LANCETS FINE MISC check sugar once daily  . pentoxifylline (TRENTAL) 400 MG CR tablet Take 1 tablet (400 mg total) by mouth 3 (three) times daily with meals.  . rosuvastatin (CRESTOR) 40 MG tablet Take 1 tablet (40 mg total) by mouth daily.  . tamsulosin (FLOMAX) 0.4 MG CAPS capsule Take 1 capsule (0.4 mg total) by mouth daily.  . vancomycin (VANCOCIN) 50 mg/mL oral solution Take 2.5 mLs (125 mg total) by mouth every 6 (six) hours for 5 days.   No facility-administered encounter medications on file as of 12/29/2017.      Review of Systems   General is not complaining of fever chills.  Skin does not complain of rashes or itching he does have staples in place left below the knee amputation as well as apparently a foot lesion which is currently covered.  Head ears eyes nose mouth and throat is not complaining of visual changes or sore throat.  Respiratory is not complaining shortness of breath or cough.  Cardiac does not complain of chest pain has some quite mild right lower extremity edema.  GI is not complaining of abdominal pain nausea vomiting or constipation says his stools now have some form to it but are not completely solid.  GU does have a history of urinary retention he is on Flomax and Proscar as well as an indwelling Foley catheter.  Musculoskeletal is not complaining of joint pain at this time.  Neurologic does not complain of dizziness headache syncope or numbness.  And psych does not complain of being overtly depressed or anxious  Immunization History  Administered Date(s) Administered  . Influenza Split 07/14/2011, 06/02/2012  . Influenza Whole 05/24/2005, 05/20/2007, 05/21/2008, 07/19/2009, 05/28/2010  . Influenza, High Dose Seasonal PF  05/03/2017  . Influenza,inj,Quad PF,6+ Mos 04/11/2013, 04/20/2014, 05/06/2015, 06/02/2016  . Influenza-Unspecified 05/03/2017  . Pneumococcal Conjugate-13 05/06/2015  . Pneumococcal Polysaccharide-23 10/17/2013  . Td 03/13/2004   Pertinent  Health Maintenance Due  Topic Date Due  . FOOT EXAM  03/01/2018  . HEMOGLOBIN A1C  03/07/2018  . INFLUENZA VACCINE  03/10/2018  . OPHTHALMOLOGY EXAM  05/03/2018  . PNA vac Low Risk Adult  Completed   Fall Risk  07/07/2017 06/30/2017 05/03/2017 05/03/2017 12/24/2015  Falls in the past year? No No No Yes No  Number falls in past yr: - - - 1 -  Injury with Fall? - - - No -  Risk for fall due to : - - - - -  Risk for fall due to: Comment - - - - -  Follow up - - Falls evaluation completed;Falls prevention discussed Falls evaluation completed -   Functional Status Survey:    Vitals:   12/29/17 1105  BP: (!) 141/70  Pulse: 80  Resp: 20  Temp: 98.3 F (36.8 C)  TempSrc: Oral  Of note manual blood pressure was 112/68  Physical Exam   In general this is a pleasant elderly male in no distress sitting in his wheelchair.  His skin is warm and dry he is not diaphoretic.  Eyes sclera conjunctive are clear visual acuity appears grossly intact.  Oropharynx is clear mucous membranes moist.  Chest is clear to auscultation there is no labored breathing.  Heart is regular irregular rate and rhythm without murmur gallop or rub he has mild right lower extremity edema.  Abdomen is soft nontender with positive bowel sounds.  GU he has an indwelling Foley catheter draining amber-colored urine.  Musculoskeletal he is status post left below the knee amputation area is currently wrapped he also has wrapping of his right foot-strength appears to be intact upper extremities as well as right lower extremity but limited exam since he is in wheelchair.  Neurologic as noted above his speech is clear no lateralizing findings.  Psych he is alert and oriented  pleasant and appropriate  Labs reviewed: Recent Labs    12/19/17 0606  12/24/17 0529 12/25/17 0715 12/26/17 2041 12/27/17 0457 12/28/17 0516  NA 135   < > 130* 133* 129* 132* 136  K 3.9   < > 4.3 4.7 4.7 4.9 5.2*  CL 106   < > 101 100* 95* 101 104  CO2 26   < > '23 25 26 24 26  ' GLUCOSE 109*   < > 180* 115* 211* 103* 104*  BUN 21*   < > 21* 19 36* 34* 28*  CREATININE 1.03   < > 0.88 0.79 1.36* 1.23 1.10  CALCIUM 8.3*   < > 8.2* 8.8* 9.1 8.6* 9.0  MG 2.0  --  1.4* 1.5*  --   --   --    < > = values in this interval not displayed.   Recent Labs    12/25/17 0715 12/26/17 2041 12/27/17 0457  AST 60* 70* 50*  ALT 35 44 35  ALKPHOS 82 98 77  BILITOT 0.6 0.6 0.6  PROT 6.3* 6.2* 5.5*  ALBUMIN 2.6* 2.6* 2.3*   Recent Labs    12/24/17 0529 12/25/17 0715 12/26/17 2041  WBC 19.4* 15.9* 16.0*  NEUTROABS 15.8* 11.9* 12.9*  HGB 8.7* 10.4* 9.9*  HCT 26.5* 32.6* 30.2*  MCV 92.7 92.9 91.8  PLT 303 364 345   Lab Results  Component Value Date   TSH 3.428 04/30/2015   Lab Results  Component Value Date   HGBA1C 6.0 (H) 09/07/2017   Lab Results  Component Value Date   CHOL 103 09/07/2017   HDL 21 (L) 09/07/2017   LDLCALC 61 09/07/2017   LDLDIRECT 99.1 02/19/2011   TRIG 128 09/07/2017   CHOLHDL 4.9 09/07/2017    Significant Diagnostic Results in last 30 days:  No results found.  Assessment/Plan  #  1-UTI he is completed antibiotic treatment he appears to be feeling better at this point will monitor-    white count was 16,000 done on May 19 will update this he is afebrile appears to be feeling better  #2 nausea and vomiting again this was thought secondary to the Cipro which he is completed-he is taking fluids well but still does not have a good solid food intake will monitor for now.  3.  History of acute renal insufficiency this thought secondary to dehydration from GI losses with nausea and vomiting--creatinine has improved somewhat at 1.23 on most recent lab will  update this.  Result this was most likely caused by the Cipro.  Less likely viral gastroenteritis.   #4- History of C. difficile-this appears to be improving on vancomycin but he is improving  #5 history of left BKA with history of gangrenous left foot- he will need expedient follow-up with surgery-also will need follow-up of his left stump site.  At this point pain appears to be controlled.  6.-  History of atrial fibrillation he continues on low-dose Eliquis as well as Lopressor for rate control blood pressure previously was reduced secondary to bradycardia.  7  History o urinary retention he has been started on Proscar and Flomax he does have an indwelling Foley catheterf and most likely will need urology follow-up.  8.-History of type 2 diabetes this appears to be stable diet-controlled CBGs this morning was 120 appear to be well controlled in the hospital as well.  9.  History of coronary artery disease this appears to be relatively stable he is on Crestor as well as anticoagulation and Lopressor.  10.  History of PVDhe is on [place].  --- #11 history of leukocytosis this was thought secondary to UTI.  Slowly trending down was 16,000 on lab done on May 19 will update this is.   #12-  History of hyponatremia this appears somewhat chronic thought to be volume related previously-last sodium was 132 will update this as well   #13--he..  History of low magnesium and potassium in the past-this is been supplemented and normalized metabolic panel will be updated  again will update a CBC to keep an eye on his white count as well as a metabolic panel to look at renal function and sodium-also will need expedient surgical follow-up with history of recent amputation   [id number]-of note greater than 40 minutes spent assessing patient reviewing his chart labs and coordinating and formulating plan of care for numerous diagnoses-of note greater than 50% of time coordinating a plan of  care

## 2017-12-30 ENCOUNTER — Ambulatory Visit: Payer: Self-pay

## 2017-12-30 ENCOUNTER — Encounter: Payer: Self-pay | Admitting: Internal Medicine

## 2017-12-30 ENCOUNTER — Encounter (HOSPITAL_COMMUNITY)
Admission: RE | Admit: 2017-12-30 | Discharge: 2017-12-30 | Disposition: A | Discharge status: Home / Self Care | Payer: PPO | Source: Skilled Nursing Facility | Attending: Internal Medicine | Admitting: Internal Medicine

## 2017-12-30 ENCOUNTER — Non-Acute Institutional Stay (SKILLED_NURSING_FACILITY): Payer: PPO | Admitting: Internal Medicine

## 2017-12-30 DIAGNOSIS — I10 Essential (primary) hypertension: Secondary | ICD-10-CM | POA: Diagnosis not present

## 2017-12-30 DIAGNOSIS — Z7901 Long term (current) use of anticoagulants: Secondary | ICD-10-CM | POA: Diagnosis not present

## 2017-12-30 DIAGNOSIS — N401 Enlarged prostate with lower urinary tract symptoms: Secondary | ICD-10-CM | POA: Diagnosis not present

## 2017-12-30 DIAGNOSIS — Z4781 Encounter for orthopedic aftercare following surgical amputation: Secondary | ICD-10-CM | POA: Diagnosis not present

## 2017-12-30 DIAGNOSIS — N179 Acute kidney failure, unspecified: Secondary | ICD-10-CM | POA: Diagnosis not present

## 2017-12-30 DIAGNOSIS — N390 Urinary tract infection, site not specified: Secondary | ICD-10-CM

## 2017-12-30 DIAGNOSIS — D649 Anemia, unspecified: Secondary | ICD-10-CM

## 2017-12-30 DIAGNOSIS — I481 Persistent atrial fibrillation: Secondary | ICD-10-CM

## 2017-12-30 DIAGNOSIS — N138 Other obstructive and reflux uropathy: Secondary | ICD-10-CM

## 2017-12-30 DIAGNOSIS — R112 Nausea with vomiting, unspecified: Secondary | ICD-10-CM | POA: Diagnosis not present

## 2017-12-30 DIAGNOSIS — S88112D Complete traumatic amputation at level between knee and ankle, left lower leg, subsequent encounter: Secondary | ICD-10-CM | POA: Insufficient documentation

## 2017-12-30 DIAGNOSIS — T83511D Infection and inflammatory reaction due to indwelling urethral catheter, subsequent encounter: Secondary | ICD-10-CM

## 2017-12-30 DIAGNOSIS — I739 Peripheral vascular disease, unspecified: Secondary | ICD-10-CM | POA: Diagnosis not present

## 2017-12-30 DIAGNOSIS — A0472 Enterocolitis due to Clostridium difficile, not specified as recurrent: Secondary | ICD-10-CM | POA: Diagnosis not present

## 2017-12-30 DIAGNOSIS — I4819 Other persistent atrial fibrillation: Secondary | ICD-10-CM

## 2017-12-30 LAB — CBC WITH DIFFERENTIAL/PLATELET
BASOS ABS: 0.1 10*3/uL (ref 0.0–0.1)
Basophils Relative: 1 %
EOS PCT: 6 %
Eosinophils Absolute: 0.6 10*3/uL (ref 0.0–0.7)
HEMATOCRIT: 30.3 % — AB (ref 39.0–52.0)
HEMOGLOBIN: 9.9 g/dL — AB (ref 13.0–17.0)
LYMPHS PCT: 22 %
Lymphs Abs: 2.3 10*3/uL (ref 0.7–4.0)
MCH: 30.3 pg (ref 26.0–34.0)
MCHC: 32.7 g/dL (ref 30.0–36.0)
MCV: 92.7 fL (ref 78.0–100.0)
Monocytes Absolute: 0.8 10*3/uL (ref 0.1–1.0)
Monocytes Relative: 8 %
NEUTROS ABS: 6.3 10*3/uL (ref 1.7–7.7)
NEUTROS PCT: 63 %
PLATELETS: 376 10*3/uL (ref 150–400)
RBC: 3.27 MIL/uL — AB (ref 4.22–5.81)
RDW: 15 % (ref 11.5–15.5)
WBC: 10.2 10*3/uL (ref 4.0–10.5)

## 2017-12-30 LAB — BASIC METABOLIC PANEL
Anion gap: 6 (ref 5–15)
BUN: 23 mg/dL — ABNORMAL HIGH (ref 6–20)
CO2: 25 mmol/L (ref 22–32)
Calcium: 8.9 mg/dL (ref 8.9–10.3)
Chloride: 103 mmol/L (ref 101–111)
Creatinine, Ser: 1.12 mg/dL (ref 0.61–1.24)
GFR calc non Af Amer: 57 mL/min — ABNORMAL LOW (ref >60)
GLUCOSE: 101 mg/dL — AB (ref 65–99)
POTASSIUM: 4.8 mmol/L (ref 3.5–5.1)
SODIUM: 134 mmol/L — AB (ref 135–145)

## 2017-12-30 NOTE — Progress Notes (Signed)
Provider: Veleta [name]  Location:   [facility] Room Number: 133/P Place of Service:  SNF (31)  PCP: Eric Frizzle, MD Patient Care Team: Eric Frizzle, MD as PCP - [facility])  Extended Emergency Contact Information Primary Emergency Contact: Eric [name] Address: [address] [place], [place] [address] [name] of [place] Phone: [phone] Mobile Phone: [phone] Relation: Spouse Secondary Emergency Contact: [place], [place], [place] [address] [name] of [employer] Phone: [phone] Relation: Son  Code Status: Full Code Goals of Care: Advanced Directive information Advanced Directives 12/30/2017  Does Patient Have a Medical Advance Directive? Yes  Type of Advance Directive (No Data)  Does patient want to make changes to medical advance directive? No - Patient declined  Copy of [place] in Chart? -  Would patient like information on creating a medical advance directive? No - Patient declined      Chief Complaint  Patient presents with  . New Admit To SNF    Patient being seen for New Admission Visit    HPI: Patient is a 84 y.o. male seen today for admission to [facility] for therapy.  Patient has h/o CAD ,Atrial Fibrillation on Eliquis, CKD Diabetes Mellitus , PAD and Hypertension He underwent BKA of Left Foot on 05/09 followed by C. Diff Colitis. He was discharged to SNF on 05/15 to SNF but was send back to hospital for Fever.and Leucocytosis. He was found to have Pseudomonas Urosepsis. Was send back to SNF on 05/18 but was send back to the hospital again for Nausea and vomiting.This time he stayed from 05/19-05/21. His vomiting resolved and he is back in the SNF for therapy. Patient says he continues to have Nausea but no Vomiting. His Appetite is still suppressed. He denies any Abdominal Pain. His diarrhea is now resolved. Pain in the stump is controlled. He  was very active before the BKA. And was driving. He lives with his wife and had son and daughter in the room He is already able to balance himself on his 1 foot.  Past Medical History:  Diagnosis Date  . Atrial flutter ([place])    s/p RFCA  . CAD (coronary artery disease)    cath 2003, occluded S-RCA, occluded S-Dx, L-LAD ok, s/p PTCA to LAD  . Cataract   . CKD (chronic kidney disease) stage 3, GFR 30-59 ml/min (HCC)   . Diabetes mellitus    diet controlled  . Hearing aid worn    bilateral  . History of kidney stones   . Long term (current) use of anticoagulants   . Neuromuscular disorder ([place])   . OSA (obstructive sleep apnea) 12/11   very mild, AHI 7/hr  . Persistent atrial fibrillation ([place]) 09/26/2015  . Pure hyperglyceridemia   . PVD (peripheral vascular disease) ([place])    angioplasty of his right lower extremity in [place] by Dr.[name] 2013  . Unspecified essential hypertension   . Wears dentures    full upper and lower   Past Surgical History:  Procedure Laterality Date  . ABDOMINAL AORTOGRAM W/LOWER EXTREMITY N/A 11/15/2017   Procedure: ABDOMINAL AORTOGRAM W/LOWER EXTREMITY;  Surgeon: [name], MD;  Location: [facility] CV LAB;  Service: Cardiovascular;  Laterality: N/A;  . Adenosine Myoview  3/06   EF 56%, neg. Ischemia  . Adenosine Myoview  02/18/07   nml  . AMPUTATION Left  12/15/2017   Procedure: AMPUTATION BELOW KNEE;  Surgeon: Algernon [name], MD;  Location: [facility] ORS;  Service: Vascular;  Laterality: Left;  . ANGIOPLASTY  1/99   CAD- diogonal with rotational artherectomy  . Arthrectomy     of LAD & PTCA  . BLEPHAROPLASTY Bilateral   . CARDIAC CATHETERIZATION  1/00  . CARDIOVERSION  1/04  . CARDIOVERSION  5/07   hospital- a flutter  . CARPAL TUNNEL RELEASE     ? bilateral  . CATARACT EXTRACTION W/PHACO Right 07/28/2017   Procedure: CATARACT EXTRACTION PHACO AND INTRAOCULAR LENS PLACEMENT ([place]) RIGHT DIABETIC;  Surgeon: [name], MD;  Location:  [place] [facility];  Service: Ophthalmology;  Laterality: Right;  Diabetic - diet controlled  . CATARACT EXTRACTION W/PHACO Left 08/18/2017   Procedure: CATARACT EXTRACTION PHACO AND INTRAOCULAR LENS PLACEMENT ([facility]);  Surgeon: [name], MD;  Location: [facility];  Service: Ophthalmology;  Laterality: Left;  DIABETES - oral meds  . COLONOSCOPY W/ BIOPSIES  10/01/06   sigmoid polyp bx neg, 3 years  . CORONARY ANGIOPLASTY  4/03   cutting balloon PTCA pLAD into Diag  . CORONARY ARTERY BYPASS GRAFT  2000   LIMA-LAD, SVG-RCA, SVG-Diag; SVG-Diag & SVG-RCA occluded 2003  . FRACTURE SURGERY    . HAND SURGERY  08/27/09   R thumb procedure wit Scaphoid Gragt and screws, Dr [name]  . NM MYOVIEW LTD  4/11   normal  . PERIPHERAL VASCULAR CATHETERIZATION Right 06/27/2015   Procedure: Lower Extremity Angiography;  Surgeon: Algernon [name], MD;  Location: [facility];  Service: Cardiovascular;  Laterality: Right;  . PERIPHERAL VASCULAR CATHETERIZATION  06/27/2015   Procedure: Lower Extremity Intervention;  Surgeon: Algernon [name], MD;  Location: [place] CV [facility];  Service: Cardiovascular;;    reports that he has never smoked. He quit smokeless tobacco use about 25 years ago. He reports that he does not drink alcohol or use drugs. Social History   Socioeconomic History  . Marital status: Married    Spouse name: Not on file  . Number of children: 5  . Years of education: Not on file  . Highest education level: Not on file  Occupational History  . Occupation: [employer]    Employer: RETIRED  Social Needs  . Financial resource strain: Not on file  . Food insecurity:    Worry: Not on file    Inability: Not on file  . Transportation needs:    Medical: Not on file    Non-medical: Not on file  Tobacco Use  . Smoking status: Never Smoker  . Smokeless tobacco: Former Network engineer and Sexual Activity  . Alcohol use: No  . Drug use: No  . Sexual activity: Never    Lifestyle  . Physical activity:    Days per week: Not on file    Minutes per session: Not on file  . Stress: Not on file  Relationships  . Social connections:    Talks on phone: Not on file    Gets together: Not on file    Attends religious service: Not on file    Active member of club or organization: Not on file    Attends meetings of clubs or organizations: Not on file    Relationship status: Not on file  . Intimate partner violence:    Fear of current or ex partner: Not on file    Emotionally abused: Not on file    Physically abused: Not on file  Forced sexual activity: Not on file  Other Topics Concern  . Not on file  Social History Narrative   Retired now does some antiques   Retired from Theatre manager and dye work   Married 1951   5 kids    Functional Status Survey:    Family History  Problem Relation Age of Onset  . Stroke Father   . Aneurysm Father   . Hypertension Father   . Prostate cancer Brother   . Hypertension Mother   . Lung cancer Brother        smoker  . Thrombosis Brother   . Other Brother        RF valve disorder (smoker)  . Lung cancer Brother        smoker  . Breast cancer Sister   . Liver cancer Brother   . Other Sister        cerebral hemorrhage    Health Maintenance  Topic Date Due  . TETANUS/TDAP  01/22/2018 (Originally 03/13/2014)  . FOOT EXAM  03/01/2018  . HEMOGLOBIN A1C  03/07/2018  . INFLUENZA VACCINE  03/10/2018  . OPHTHALMOLOGY EXAM  05/03/2018  . PNA vac Low Risk Adult  Completed    Allergies  Allergen Reactions  . Isosorbide Mononitrate Other (See Comments)    Unknown, doesn't remember   . Ramipril Cough  . Quinidine Gluconate Rash    Outpatient Encounter Medications as of 12/30/2017  Medication Sig  . acetaminophen (TYLENOL) 325 MG tablet Take 2 tablets (650 mg total) by mouth every 6 (six) hours as needed for mild pain (or temp >/= 101 F).  [name] amLODipine (NORVASC) 2.5 MG tablet Take 1 tablet (2.5 mg total) by mouth  daily.  [name] apixaban (ELIQUIS) 2.5 MG TABS tablet Take 1 tablet (2.5 mg total) by mouth 2 (two) times daily.  . B Complex Vitamins (B COMPLEX-B12) TABS Take 1 tablet by mouth daily.   Roseanne Kaufman [place]-Castor Oil (VENELEX) OINT Apply 1 application topically 3 (three) times daily. Applied at every shift day, evening, and night to bilateral buttocks/sacrum area  . Blood Glucose Monitoring Suppl (ONE TOUCH ULTRA SYSTEM KIT) W/DEVICE KIT 1 kit by Does not apply route once.  . calcium carbonate (TUMS - DOSED IN MG ELEMENTAL CALCIUM) 500 MG chewable tablet Chew 1 tablet by mouth daily as needed for indigestion or heartburn.  . cholecalciferol (VITAMIN D) 1000 UNITS tablet Take 1,000 Units by mouth 2 (two) times a week. Takes on Mon and Fri  . Cinnamon 500 MG capsule Take 1,000 mg by mouth 2 (two) times daily.   . fenofibrate 160 MG tablet Take 1 tablet (160 mg total) by mouth daily.  . finasteride (PROSCAR) 5 MG tablet Take 1 tablet (5 mg total) by mouth daily.  . fish oil-omega-3 fatty acids 1000 MG capsule Take 1,000 mg by mouth daily.   [name] glucose blood test strip Use as instructed  . lisinopril (PRINIVIL,ZESTRIL) 20 MG tablet Take 1 tablet (20 mg total) by mouth daily.  [name] liver oil-zinc oxide (DESITIN) 40 % ointment Apply topically 3 (three) times daily between meals.  . metoprolol tartrate (LOPRESSOR) 25 MG tablet Take 0.5 tablets (12.5 mg total) by mouth 2 (two) times daily.  . Multiple Vitamin (DAILY MULTIVITAMIN PO) Take 1 tablet by mouth daily.    . nutrition supplement, JUVEN, (JUVEN) PACK Take 1 packet by mouth 2 (two) times daily between meals.  . ondansetron (ZOFRAN) 4 MG tablet Take 4 mg by mouth every 6 (six) hours  as needed for nausea or vomiting.  [name] DELICA LANCETS FINE MISC check sugar once daily  . pentoxifylline (TRENTAL) 400 MG CR tablet Take 1 tablet (400 mg total) by mouth 3 (three) times daily with meals.  . rosuvastatin (CRESTOR) 40 MG tablet Take 1 tablet (40 mg total) by  mouth daily.  . tamsulosin (FLOMAX) 0.4 MG CAPS capsule Take 1 capsule (0.4 mg total) by mouth daily.  . vancomycin (VANCOCIN) 50 mg/mL oral solution Take 2.5 mLs (125 mg total) by mouth every 6 (six) hours for 5 days.   No facility-administered encounter medications on file as of 12/30/2017.      Review of Systems  Review of Systems  Constitutional: Negative for activity change, appetite change, chills, diaphoresis, fatigue and fever.  HENT: Negative for mouth sores, postnasal drip, rhinorrhea, sinus pain and sore throat.   Respiratory: Negative for apnea, cough, chest tightness, shortness of breath and wheezing.   Cardiovascular: Negative for chest pain, palpitations and leg swelling.  Gastrointestinal: Negative for abdominal distention, abdominal pain, constipation, diarrhea, Genitourinary: Negative for dysuria and frequency. Has Foley Cathter. Musculoskeletal: Negative for arthralgias, joint swelling and myalgias.  Skin: Negative for rash.  Neurological: Negative for dizziness, syncope, weakness, light-headedness and numbness.  Psychiatric/Behavioral: Negative for behavioral problems, confusion and sleep disturbance.     Vitals:   12/30/17 1031  BP: 129/69  Pulse: 68  Resp: 18  Temp: 98.4 F (36.9 C)  TempSrc: Oral   There is no height or weight on file to calculate BMI. Physical Exam  Constitutional: He is oriented to person, place, and time. He appears well-developed and well-nourished.  HENT:  Head: Normocephalic.  Mouth/Throat: Oropharynx is clear and moist.  Eyes: Pupils are equal, round, and reactive to light.  Neck: Neck supple.  Cardiovascular: Normal rate. An irregular rhythm present.  No murmur heard. Pulmonary/Chest: Effort normal and breath sounds normal. No stridor. No respiratory distress. He has no wheezes.  Abdominal: Soft. Bowel sounds are normal. He exhibits no distension. There is no tenderness. There is no guarding.  Musculoskeletal: He exhibits no  edema.  Has Callous in Right Foot. D/W the Wound care nurse.  Lymphadenopathy:    He has no cervical adenopathy.  Neurological: He is alert and oriented to person, place, and time.  No Focal deficit  Skin: Skin is warm and dry.  Psychiatric: He has a normal mood and affect. His behavior is normal. Thought content normal.    Labs reviewed: Basic Metabolic Panel: Recent Labs    12/19/17 0606  12/24/17 0529 12/25/17 0715  12/27/17 0457 12/28/17 0516 12/30/17 0740  NA 135   < > 130* 133*   < > 132* 136 134*  K 3.9   < > 4.3 4.7   < > 4.9 5.2* 4.8  CL 106   < > 101 100*   < > 101 104 103  CO2 26   < > 23 25   < > _0 GLUCOSE 109*   < > 180* 115*   < > 103* 104* 101*  BUN 21*   < > 21* 19   < > 34* 28* 23*  CREATININE 1.03   < > 0.88 0.79   < > 1.23 1.10 1.12  CALCIUM 8.3*   < > 8.2* 8.8*   < > 8.6* 9.0 8.9  MG 2.0  --  1.4* 1.5*  --   --   --   --    < > =  values in this interval not displayed.   Liver Function Tests: Recent Labs    12/25/17 0715 12/26/17 2041 12/27/17 0457  AST 60* 70* 50*  ALT 35 44 35  ALKPHOS 82 98 77  BILITOT 0.6 0.6 0.6  PROT 6.3* 6.2* 5.5*  ALBUMIN 2.6* 2.6* 2.3*   Recent Labs    12/26/17 2041  LIPASE 66*   No results for input(s): AMMONIA in the last 8760 hours. CBC: Recent Labs    12/25/17 0715 12/26/17 2041 12/30/17 0740  WBC 15.9* 16.0* 10.2  NEUTROABS 11.9* 12.9* 6.3  HGB 10.4* 9.9* 9.9*  HCT 32.6* 30.2* 30.3*  MCV 92.9 91.8 92.7  PLT 364 345 376   Cardiac Enzymes: Recent Labs    12/26/17 2041  TROPONINI <0.03   BNP: Invalid input(s): POCBNP Lab Results  Component Value Date   HGBA1C 6.0 (H) 09/07/2017   Lab Results  Component Value Date   TSH 3.428 04/30/2015   Lab Results  Component Value Date   [id number] 536 10/10/2013   No results found for: FOLATE No results found for: IRON, TIBC, FERRITIN  Imaging and Procedures obtained prior to SNF admission: No results found.  Assessment/Plan  PVD  (peripheral vascular disease)  S/P BKA Patient doing well. Pain is controlled Has follow up tomorrow with Dr [name] Patient is working with therapy and is doing well. Continue Statin and Pentoxyfilline UTI Due to Pseudomonas  Afebrile Completed the Course of Antibiotics Foley Cathter Due to Urinary Obstruction  Will try Voiding trial in Facility. Urology Consult On Flomax and Finasteride.  C. difficile colitis Continue PO Vanco till 05/23 Nausea and Vomiting Vomiting resolved Continues to have Nausea Will monitor weight and PO intake. Normocytic anemia Hgb Stable  Atrial Fibrillation On Chronic Eliquis Rate Control on Metoprolol  Hypertension On Metoprolol and Lisinopril and Amlodipine. Disposition Patient plan to go home with his wife.        Family/ staff Communication:   Labs/tests ordered: Total time spent in this patient care encounter was 45_ minutes; greater than 50% of the visit spent counseling patient, reviewing records , Labs and coordinating care for problems addressed at this encounter.

## 2017-12-31 ENCOUNTER — Encounter (INDEPENDENT_AMBULATORY_CARE_PROVIDER_SITE_OTHER): Payer: Self-pay | Admitting: Vascular Surgery

## 2017-12-31 ENCOUNTER — Ambulatory Visit (INDEPENDENT_AMBULATORY_CARE_PROVIDER_SITE_OTHER): Payer: PPO | Admitting: Vascular Surgery

## 2017-12-31 VITALS — BP 139/63 | HR 70 | Resp 15 | Ht 69.0 in | Wt 160.0 lb

## 2017-12-31 DIAGNOSIS — I739 Peripheral vascular disease, unspecified: Secondary | ICD-10-CM

## 2017-12-31 NOTE — Progress Notes (Signed)
Subjective:    Patient ID: Eric [name]., male    DOB: 1927/12/29, 84 y.o.   MRN: [id number] Chief Complaint  Patient presents with  . Follow-up    10 day Amputation follow up   Patient presents for his first postoperative follow-up.  The patient continues to reside at rehab.  The patient's postoperative course has remained uneventful.  Patient seen with wife and family member.  The patient is able to completely straighten his leg.  The patient notes having a blood blister located to the right lateral aspect of his foot which he says was " popped and cut" by someone at rehab.  Patient denies any drainage or skin breakdown to his stump.  Patient denies any fever, nausea vomiting.  Review of Systems  Constitutional: Negative.   HENT: Negative.   Eyes: Negative.   Respiratory: Negative.   Cardiovascular:       S/p Left BKA  Gastrointestinal: Negative.   Endocrine: Negative.   Genitourinary: Negative.   Musculoskeletal: Negative.   Skin: Positive for wound.  Allergic/Immunologic: Negative.   Neurological: Negative.   Hematological: Negative.   Psychiatric/Behavioral: Negative.       Objective:   Physical Exam  Constitutional: He is oriented to person, place, and time. He appears well-developed and well-nourished. No distress.  HENT:  Head: Normocephalic and atraumatic.  Right Ear: External ear normal.  Left Ear: External ear normal.  Eyes: Pupils are equal, round, and reactive to light. Conjunctivae and EOM are normal.  Neck: Normal range of motion.  Cardiovascular: Normal rate, regular rhythm and normal heart sounds.  Pulses:      Radial pulses are 2+ on the right side, and 2+ on the left side.  Status post left BKA To palpate right pedal pulses due to edema  Pulmonary/Chest: Effort normal and breath sounds normal.  Musculoskeletal: Normal range of motion. He exhibits edema (Mild to moderate right lower extremity edema).  Neurological: He is alert and oriented to  person, place, and time.  Skin: He is not diaphoretic.  Left BKA: Some ecchymosis noted surrounding the stump.  Stump is healing relatively well.  There is a small area at the lateral aspect of the stump incision line which looks like possible beginning of dehiscence.  Otherwise there is some scab along the incision however the incision is clean dry and intact.  Staples are intact.  Right foot: 4 cm x 2 cm wound noted to the lateral aspect just by the fifth toe.  There is no drainage there is no necrotic tissue there is no cellulitis.  Vitals reviewed.  BP 139/63 (BP Location: Right Arm, Patient Position: Sitting)   Pulse 70   Resp 15   Ht _0  (1.753 m)   Wt 160 lb (72.6 kg)   BMI 23.63 kg/m   Past Medical History:  Diagnosis Date  . Atrial flutter ([place])    s/p RFCA  . CAD (coronary artery disease)    cath 2003, occluded S-RCA, occluded S-Dx, L-LAD ok, s/p PTCA to LAD  . Cataract   . CKD (chronic kidney disease) stage 3, GFR 30-59 ml/min (HCC)   . Diabetes mellitus    diet controlled  . Hearing aid worn    bilateral  . History of kidney stones   . Long term (current) use of anticoagulants   . Neuromuscular disorder ([place])   . OSA (obstructive sleep apnea) 12/11   very mild, AHI 7/hr  . Persistent atrial fibrillation ([place]) 09/26/2015  .  Pure hyperglyceridemia   . PVD (peripheral vascular disease) ([place])    angioplasty of his right lower extremity in [place] by Dr.[name] 2013  . Unspecified essential hypertension   . Wears dentures    full upper and lower   Social History   Socioeconomic History  . Marital status: Married    Spouse name: Not on file  . Number of children: 5  . Years of education: Not on file  . Highest education level: Not on file  Occupational History  . Occupation: [employer]    Employer: RETIRED  Social Needs  . Financial resource strain: Not on file  . Food insecurity:    Worry: Not on file    Inability: Not on file  . Transportation  needs:    Medical: Not on file    Non-medical: Not on file  Tobacco Use  . Smoking status: Never Smoker  . Smokeless tobacco: Former Network engineer and Sexual Activity  . Alcohol use: No  . Drug use: No  . Sexual activity: Never  Lifestyle  . Physical activity:    Days per week: Not on file    Minutes per session: Not on file  . Stress: Not on file  Relationships  . Social connections:    Talks on phone: Not on file    Gets together: Not on file    Attends religious service: Not on file    Active member of club or organization: Not on file    Attends meetings of clubs or organizations: Not on file    Relationship status: Not on file  . Intimate partner violence:    Fear of current or ex partner: Not on file    Emotionally abused: Not on file    Physically abused: Not on file    Forced sexual activity: Not on file  Other Topics Concern  . Not on file  Social History Narrative   Retired now does some antiques   Retired from Theatre manager and dye work   Married 1951   5 kids   Past Surgical History:  Procedure Laterality Date  . ABDOMINAL AORTOGRAM W/LOWER EXTREMITY N/A 11/15/2017   Procedure: ABDOMINAL AORTOGRAM W/LOWER EXTREMITY;  Surgeon: [name], MD;  Location: [facility];  Service: Cardiovascular;  Laterality: N/A;  . Adenosine Myoview  3/06   EF 56%, neg. Ischemia  . Adenosine Myoview  02/18/07   nml  . AMPUTATION Left 12/15/2017   Procedure: AMPUTATION BELOW KNEE;  Surgeon: Algernon [name], MD;  Location: [facility];  Service: Vascular;  Laterality: Left;  . ANGIOPLASTY  1/99   CAD- diogonal with rotational artherectomy  . Arthrectomy     of LAD & PTCA  . BLEPHAROPLASTY Bilateral   . CARDIAC CATHETERIZATION  1/00  . CARDIOVERSION  1/04  . CARDIOVERSION  5/07   hospital- a flutter  . CARPAL TUNNEL RELEASE     ? bilateral  . CATARACT EXTRACTION W/PHACO Right 07/28/2017   Procedure: CATARACT EXTRACTION PHACO AND INTRAOCULAR LENS PLACEMENT ([place]) RIGHT  DIABETIC;  Surgeon: [name], MD;  Location: [facility];  Service: Ophthalmology;  Laterality: Right;  Diabetic - diet controlled  . CATARACT EXTRACTION W/PHACO Left 08/18/2017   Procedure: CATARACT EXTRACTION PHACO AND INTRAOCULAR LENS PLACEMENT ([facility]);  Surgeon: [name], MD;  Location: [facility];  Service: Ophthalmology;  Laterality: Left;  DIABETES - oral meds  . COLONOSCOPY W/ BIOPSIES  10/01/06   sigmoid polyp bx neg, 3 years  .  CORONARY ANGIOPLASTY  4/03   cutting balloon PTCA pLAD into Diag  . CORONARY ARTERY BYPASS GRAFT  2000   LIMA-LAD, SVG-RCA, SVG-Diag; SVG-Diag & SVG-RCA occluded 2003  . FRACTURE SURGERY    . HAND SURGERY  08/27/09   R thumb procedure wit Scaphoid Gragt and screws, Dr [name]  . NM MYOVIEW LTD  4/11   normal  . PERIPHERAL VASCULAR CATHETERIZATION Right 06/27/2015   Procedure: Lower Extremity Angiography;  Surgeon: Algernon [name], MD;  Location: [facility];  Service: Cardiovascular;  Laterality: Right;  . PERIPHERAL VASCULAR CATHETERIZATION  06/27/2015   Procedure: Lower Extremity Intervention;  Surgeon: Algernon [name], MD;  Location: [facility];  Service: Cardiovascular;;   Family History  Problem Relation Age of Onset  . Stroke Father   . Aneurysm Father   . Hypertension Father   . Prostate cancer Brother   . Hypertension Mother   . Lung cancer Brother        smoker  . Thrombosis Brother   . Other Brother        RF valve disorder (smoker)  . Lung cancer Brother        smoker  . Breast cancer Sister   . Liver cancer Brother   . Other Sister        cerebral hemorrhage   Allergies  Allergen Reactions  . Isosorbide Mononitrate Other (See Comments)    Unknown, doesn't remember   . Ramipril Cough  . Quinidine Gluconate Rash      Assessment & Plan:  Patient presents for his first postoperative follow-up.  The patient continues to reside at rehab.  The patient's postoperative course has remained  uneventful.  Patient seen with wife and family member.  The patient is able to completely straighten his leg.  The patient notes having a blood blister located to the right lateral aspect of his foot which he says was " popped and cut" by someone at rehab.  Patient denies any drainage or skin breakdown to his stump.  Patient denies any fever, nausea vomiting.  1) s/p Left BKA - New Stump is healing relatively well.  Will have to watch out for that lateral aspect that looks like it may be starting to dehisce.  I have written to the habilitation facility to please place Silvadene to that area.  When the patient is not involved in activities/physical therapy to please leave his stump open to air.  The patient is to follow-up in 1 week for possible removal of the first set of staples.  2) right foot wound - new Unsure as to why staff at the rehabilitation facility would pop a blister however now there is a wound that we will have to watch closely.  I have advised the rehabilitation center to place Silvadene to this area as well.  Encourage the patient to elevate his right leg as there is some edema.  Wound check in 1 week  Current Outpatient Medications on File Prior to Visit  Medication Sig Dispense Refill  . acetaminophen (TYLENOL) 325 MG tablet Take 2 tablets (650 mg total) by mouth every 6 (six) hours as needed for mild pain (or temp >/= 101 F).    [name] amLODipine (NORVASC) 2.5 MG tablet Take 1 tablet (2.5 mg total) by mouth daily. 30 tablet 0  . apixaban (ELIQUIS) 2.5 MG TABS tablet Take 1 tablet (2.5 mg total) by mouth 2 (two) times daily. 180 tablet 1  . B Complex Vitamins (B  COMPLEX-B12) TABS Take 1 tablet by mouth daily.     Roseanne Kaufman [place]-Castor Oil (VENELEX) OINT Apply 1 application topically 3 (three) times daily. Applied at every shift day, evening, and night to bilateral buttocks/sacrum area    . Blood Glucose Monitoring Suppl (ONE TOUCH ULTRA SYSTEM KIT) W/DEVICE KIT 1 kit by Does not apply  route once. 1 each 0  . calcium carbonate (TUMS - DOSED IN MG ELEMENTAL CALCIUM) 500 MG chewable tablet Chew 1 tablet by mouth daily as needed for indigestion or heartburn.    . cholecalciferol (VITAMIN D) 1000 UNITS tablet Take 1,000 Units by mouth 2 (two) times a week. Takes on Mon and Fri    . Cinnamon 500 MG capsule Take 1,000 mg by mouth 2 (two) times daily.     . fenofibrate 160 MG tablet Take 1 tablet (160 mg total) by mouth daily. 90 tablet 2  . finasteride (PROSCAR) 5 MG tablet Take 1 tablet (5 mg total) by mouth daily. 30 tablet 0  . fish oil-omega-3 fatty acids 1000 MG capsule Take 1,000 mg by mouth daily.     [name] glucose blood test strip Use as instructed 100 each 3  . lisinopril (PRINIVIL,ZESTRIL) 20 MG tablet Take 1 tablet (20 mg total) by mouth daily. 30 tablet 0  . liver oil-zinc oxide (DESITIN) 40 % ointment Apply topically 3 (three) times daily between meals. 56.7 g 0  . metoprolol tartrate (LOPRESSOR) 25 MG tablet Take 0.5 tablets (12.5 mg total) by mouth 2 (two) times daily. 30 tablet 0  . Multiple Vitamin (DAILY MULTIVITAMIN PO) Take 1 tablet by mouth daily.      . nutrition supplement, JUVEN, (JUVEN) PACK Take 1 packet by mouth 2 (two) times daily between meals. 60 packet 0  . ondansetron (ZOFRAN) 4 MG tablet Take 4 mg by mouth every 6 (six) hours as needed for nausea or vomiting.    [name] DELICA LANCETS FINE MISC check sugar once daily 100 each 2  . pentoxifylline (TRENTAL) 400 MG CR tablet Take 1 tablet (400 mg total) by mouth 3 (three) times daily with meals. 90 tablet 3  . rosuvastatin (CRESTOR) 40 MG tablet Take 1 tablet (40 mg total) by mouth daily. 90 tablet 2  . tamsulosin (FLOMAX) 0.4 MG CAPS capsule Take 1 capsule (0.4 mg total) by mouth daily. 30 capsule 0   No current facility-administered medications on file prior to visit.    There are no Patient Instructions on file for this visit. No follow-ups on file.  Collier Bohnet A Lorilyn Laitinen, PA-C

## 2018-01-04 ENCOUNTER — Telehealth: Payer: Self-pay | Admitting: Cardiology

## 2018-01-04 NOTE — Telephone Encounter (Signed)
Any of the 3 would be good [facility]

## 2018-01-04 NOTE — Telephone Encounter (Signed)
New message    Janett Billow from [facility] calling on behalf of the patient, requesting to switch to [facility]. The family has issues with transportation. [facility] would be closer. Please advise who would be willing to accept as new patient.

## 2018-01-06 ENCOUNTER — Encounter (HOSPITAL_COMMUNITY)
Admission: RE | Admit: 2018-01-06 | Discharge: 2018-01-06 | Disposition: A | Discharge status: Home / Self Care | Payer: PPO | Source: Skilled Nursing Facility | Attending: Internal Medicine | Admitting: Internal Medicine

## 2018-01-06 ENCOUNTER — Ambulatory Visit: Payer: PPO | Admitting: Cardiology

## 2018-01-06 DIAGNOSIS — N179 Acute kidney failure, unspecified: Secondary | ICD-10-CM | POA: Insufficient documentation

## 2018-01-06 DIAGNOSIS — Z4781 Encounter for orthopedic aftercare following surgical amputation: Secondary | ICD-10-CM | POA: Insufficient documentation

## 2018-01-06 DIAGNOSIS — S88112D Complete traumatic amputation at level between knee and ankle, left lower leg, subsequent encounter: Secondary | ICD-10-CM | POA: Diagnosis not present

## 2018-01-06 DIAGNOSIS — D631 Anemia in chronic kidney disease: Secondary | ICD-10-CM | POA: Diagnosis not present

## 2018-01-06 LAB — BASIC METABOLIC PANEL
Anion gap: 9 (ref 5–15)
BUN: 32 mg/dL — ABNORMAL HIGH (ref 6–20)
CHLORIDE: 102 mmol/L (ref 101–111)
CO2: 24 mmol/L (ref 22–32)
Calcium: 9.3 mg/dL (ref 8.9–10.3)
Creatinine, Ser: 1.32 mg/dL — ABNORMAL HIGH (ref 0.61–1.24)
GFR calc Af Amer: 54 mL/min — ABNORMAL LOW (ref >60)
GFR calc non Af Amer: 47 mL/min — ABNORMAL LOW (ref >60)
Glucose, Bld: 113 mg/dL — ABNORMAL HIGH (ref 65–99)
POTASSIUM: 4.2 mmol/L (ref 3.5–5.1)
SODIUM: 135 mmol/L (ref 135–145)

## 2018-01-06 LAB — CBC WITH DIFFERENTIAL/PLATELET
Basophils Absolute: 0.1 10*3/uL (ref 0.0–0.1)
Basophils Relative: 1 %
EOS ABS: 0.3 10*3/uL (ref 0.0–0.7)
Eosinophils Relative: 3 %
HEMATOCRIT: 35.1 % — AB (ref 39.0–52.0)
HEMOGLOBIN: 11.2 g/dL — AB (ref 13.0–17.0)
LYMPHS ABS: 3 10*3/uL (ref 0.7–4.0)
Lymphocytes Relative: 31 %
MCH: 29.6 pg (ref 26.0–34.0)
MCHC: 31.9 g/dL (ref 30.0–36.0)
MCV: 92.9 fL (ref 78.0–100.0)
Monocytes Absolute: 0.6 10*3/uL (ref 0.1–1.0)
Monocytes Relative: 6 %
NEUTROS ABS: 5.7 10*3/uL (ref 1.7–7.7)
NEUTROS PCT: 59 %
Platelets: 343 10*3/uL (ref 150–400)
RBC: 3.78 MIL/uL — AB (ref 4.22–5.81)
RDW: 15.2 % (ref 11.5–15.5)
WBC: 9.7 10*3/uL (ref 4.0–10.5)

## 2018-01-07 ENCOUNTER — Ambulatory Visit: Payer: Self-pay | Admitting: Urology

## 2018-01-11 ENCOUNTER — Ambulatory Visit (INDEPENDENT_AMBULATORY_CARE_PROVIDER_SITE_OTHER): Payer: PPO | Admitting: Vascular Surgery

## 2018-01-11 DIAGNOSIS — R338 Other retention of urine: Secondary | ICD-10-CM | POA: Diagnosis not present

## 2018-01-13 ENCOUNTER — Encounter (HOSPITAL_COMMUNITY)
Admission: RE | Admit: 2018-01-13 | Discharge: 2018-01-13 | Disposition: A | Discharge status: Home / Self Care | Payer: PPO | Source: Skilled Nursing Facility | Attending: Internal Medicine | Admitting: Internal Medicine

## 2018-01-13 ENCOUNTER — Ambulatory Visit (INDEPENDENT_AMBULATORY_CARE_PROVIDER_SITE_OTHER): Payer: PPO | Admitting: Vascular Surgery

## 2018-01-13 ENCOUNTER — Non-Acute Institutional Stay (SKILLED_NURSING_FACILITY): Payer: PPO | Admitting: Internal Medicine

## 2018-01-13 ENCOUNTER — Encounter (INDEPENDENT_AMBULATORY_CARE_PROVIDER_SITE_OTHER): Payer: Self-pay | Admitting: Vascular Surgery

## 2018-01-13 VITALS — BP 111/62 | HR 68 | Resp 16 | Ht 65.0 in | Wt 156.8 lb

## 2018-01-13 DIAGNOSIS — T83511D Infection and inflammatory reaction due to indwelling urethral catheter, subsequent encounter: Secondary | ICD-10-CM

## 2018-01-13 DIAGNOSIS — Z4781 Encounter for orthopedic aftercare following surgical amputation: Secondary | ICD-10-CM | POA: Insufficient documentation

## 2018-01-13 DIAGNOSIS — E871 Hypo-osmolality and hyponatremia: Secondary | ICD-10-CM | POA: Diagnosis not present

## 2018-01-13 DIAGNOSIS — D72829 Elevated white blood cell count, unspecified: Secondary | ICD-10-CM | POA: Diagnosis not present

## 2018-01-13 DIAGNOSIS — N390 Urinary tract infection, site not specified: Secondary | ICD-10-CM | POA: Diagnosis not present

## 2018-01-13 DIAGNOSIS — I481 Persistent atrial fibrillation: Secondary | ICD-10-CM | POA: Diagnosis not present

## 2018-01-13 DIAGNOSIS — N179 Acute kidney failure, unspecified: Secondary | ICD-10-CM | POA: Diagnosis not present

## 2018-01-13 DIAGNOSIS — Z89512 Acquired absence of left leg below knee: Secondary | ICD-10-CM | POA: Diagnosis not present

## 2018-01-13 DIAGNOSIS — S88112D Complete traumatic amputation at level between knee and ankle, left lower leg, subsequent encounter: Secondary | ICD-10-CM | POA: Insufficient documentation

## 2018-01-13 DIAGNOSIS — D631 Anemia in chronic kidney disease: Secondary | ICD-10-CM | POA: Insufficient documentation

## 2018-01-13 DIAGNOSIS — I4819 Other persistent atrial fibrillation: Secondary | ICD-10-CM

## 2018-01-13 LAB — CBC
HCT: 34.3 % — ABNORMAL LOW (ref 39.0–52.0)
HEMOGLOBIN: 10.9 g/dL — AB (ref 13.0–17.0)
MCH: 29.8 pg (ref 26.0–34.0)
MCHC: 31.8 g/dL (ref 30.0–36.0)
MCV: 93.7 fL (ref 78.0–100.0)
Platelets: 292 10*3/uL (ref 150–400)
RBC: 3.66 MIL/uL — ABNORMAL LOW (ref 4.22–5.81)
RDW: 15.4 % (ref 11.5–15.5)
WBC: 14.2 10*3/uL — ABNORMAL HIGH (ref 4.0–10.5)

## 2018-01-13 LAB — BASIC METABOLIC PANEL
Anion gap: 8 (ref 5–15)
BUN: 42 mg/dL — AB (ref 6–20)
CALCIUM: 9.4 mg/dL (ref 8.9–10.3)
CHLORIDE: 101 mmol/L (ref 101–111)
CO2: 24 mmol/L (ref 22–32)
CREATININE: 1.66 mg/dL — AB (ref 0.61–1.24)
GFR calc Af Amer: 41 mL/min — ABNORMAL LOW (ref >60)
GFR calc non Af Amer: 35 mL/min — ABNORMAL LOW (ref >60)
Glucose, Bld: 109 mg/dL — ABNORMAL HIGH (ref 65–99)
Potassium: 4.4 mmol/L (ref 3.5–5.1)
SODIUM: 133 mmol/L — AB (ref 135–145)

## 2018-01-13 LAB — URINALYSIS, ROUTINE W REFLEX MICROSCOPIC
Bilirubin Urine: NEGATIVE
Glucose, UA: NEGATIVE mg/dL
Ketones, ur: NEGATIVE mg/dL
Nitrite: POSITIVE — AB
PROTEIN: NEGATIVE mg/dL
Specific Gravity, Urine: 1.006 (ref 1.005–1.030)
WBC, UA: >50 WBC/hpf — ABNORMAL HIGH (ref 0–5)
pH: 6 (ref 5.0–8.0)

## 2018-01-13 NOTE — Progress Notes (Signed)
Subjective:    Patient ID: Eric [name]., male    DOB: 10/14/1927, 84 y.o.   MRN: [id number] Chief Complaint  Patient presents with  . Follow-up    14monthwound check   Patient presents for his second postoperative follow-up/wound check.  The patient's rehab center is refusing to allow his stump to be left open to air.  They said "it would not be a good idea".  They have been placing Silvadene to the left stump incision and his right lateral foot wound.  The patient and his family seem to feel the Silvadene has made improvement to the appearance of both wounds.  Patient continues to reside at rehab.  His postoperative course continues to be unremarkable. Denies any drainage from his stump site. Denies any wound dehiscence. Patient denies any fever, nausea vomiting.  Review of Systems  Constitutional: Negative.   HENT: Negative.   Eyes: Negative.   Respiratory: Negative.   Cardiovascular:       S/p Left BKA  Gastrointestinal: Negative.   Endocrine: Negative.   Genitourinary: Negative.   Musculoskeletal: Negative.   Skin: Positive for wound.  Allergic/Immunologic: Negative.   Neurological: Negative.   Hematological: Negative.   Psychiatric/Behavioral: Negative.       Objective:   Physical Exam  Constitutional: He is oriented to person, place, and time. He appears well-developed and well-nourished. No distress.  HENT:  Head: Normocephalic and atraumatic.  Right Ear: External ear normal.  Left Ear: External ear normal.  Eyes: Pupils are equal, round, and reactive to light. Conjunctivae and EOM are normal.  Neck: Normal range of motion.  Cardiovascular: Normal rate, regular rhythm, normal heart sounds and intact distal pulses.  Status post left BKA. Unable to palpate pedal pulses to the right foot however the foot is warm.  Pulmonary/Chest: Effort normal and breath sounds normal.  Musculoskeletal: Normal range of motion. He exhibits edema (Mild non-pitting edema to the  right lower extremity).  Neurological: He is alert and oriented to person, place, and time.  Skin: He is not diaphoretic.  Left BKA incision: Less erythema noted at this visit.  Some dehiscence noted to the lateral aspect of the wound.  No drainage.  No necrotic tissue. Right lateral foot wound: Is dry now a scab.  No necrotic tissue or cellulitis noted.  Psychiatric: He has a normal mood and affect. His behavior is normal. Judgment and thought content normal.  Vitals reviewed.  BP 111/62 (BP Location: Right Arm)   Pulse 68   Resp 16   Ht _0  (1.651 m)   Wt 156 lb 12.8 oz (71.1 kg)   BMI 26.09 kg/m   Past Medical History:  Diagnosis Date  . [place]    s/p RFCA  . CAD (coronary artery disease)    cath 2003, occluded S-RCA, occluded S-Dx, L-LAD ok, s/p PTCA to LAD  . Cataract   . CKD (chronic kidney disease) stage 3, GFR 30-59 ml/min (HCC)   . Diabetes mellitus    diet controlled  . Hearing aid worn    bilateral  . History of kidney stones   . Long term (current) use of anticoagulants   . Neuromuscular disorder ([place]   . OSA (obstructive sleep apnea) 12/11   very mild, AHI 7/hr  . Persistent atrial fibrillation ([place] 09/26/2015  . Pure hyperglyceridemia   . PVD [place]ipheral vascular disease) [place]    [name]  .  Unspecified essential hypertension   . Wears dentures    full upper and lower   Social History   Socioeconomic History  . Marital status: Married    Spouse name: Not on file  . Number of children: 5  . Years of education: Not on file  . Highest education level: Not on file  Occupational History  . Occupation: [employer]    Employer: RETIRED  Social Needs  . Financial resource strain: Not on file  . Food insecurity:    Worry: Not on file    Inability: Not on file  . Transportation needs:    Medical: Not on file    Non-medical: Not on file  Tobacco Use  . Smoking status:  Never Smoker  . Smokeless tobacco: Former Network engineer and Sexual Activity  . Alcohol use: No  . Drug use: No  . Sexual activity: Never  Lifestyle  . Physical activity:    Days per week: Not on file    Minutes per session: Not on file  . Stress: Not on file  Relationships  . Social connections:    Talks on phone: Not on file    Gets together: Not on file    Attends religious service: Not on file    Active member of club or organization: Not on file    Attends meetings of clubs or organizations: Not on file    Relationship status: Not on file  . Intimate partner violence:    Fear of current or ex partner: Not on file    Emotionally abused: Not on file    Physically abused: Not on file    Forced sexual activity: Not on file  Other Topics Concern  . Not on file  Social History Narrative   Retired now does some antiques   Retired from Theatre manager and dye work   Married 1951   5 kids   Past Surgical History:  Procedure Laterality Date  . ABDOMINAL AORTOGRAM W/LOWER EXTREMITY N/A 11/15/2017   Procedure: ABDOMINAL AORTOGRAM W/LOWER EXTREMITY;  Surgeon: [name], MD;  Location: [facility];  Service: Cardiovascular;  Laterality: N/A;  . Adenosine Myoview  3/06   EF 56%, neg. Ischemia  . Adenosine Myoview  02/18/07   nml  . AMPUTATION Left 12/15/2017   Procedure: AMPUTATION BELOW KNEE;  Surgeon: Algernon [name], MD;  Location: [facility];  Service: Vascular;  Laterality: Left;  . ANGIOPLASTY  1/99   CAD- diogonal with rotational artherectomy  . Arthrectomy     of LAD & PTCA  . BLEPHAROPLASTY Bilateral   . CARDIAC CATHETERIZATION  1/00  . CARDIOVERSION  1/04  . CARDIOVERSION  5/07   hospital- a flutter  . CARPAL TUNNEL RELEASE     ? bilateral  . CATARACT EXTRACTION W/PHACO Right 07/28/2017   Procedure: CATARACT EXTRACTION PHACO AND INTRAOCULAR LENS PLACEMENT ([place]) RIGHT DIABETIC;  Surgeon: [name], MD;  Location: [facility];  Service: Ophthalmology;   Laterality: Right;  Diabetic - diet controlled  . CATARACT EXTRACTION W/PHACO Left 08/18/2017   Procedure: CATARACT EXTRACTION PHACO AND INTRAOCULAR LENS PLACEMENT ([facility]);  Surgeon: [name], MD;  Location: [facility];  Service: Ophthalmology;  Laterality: Left;  DIABETES - oral meds  . COLONOSCOPY W/ BIOPSIES  10/01/06   sigmoid polyp bx neg, 3 years  . CORONARY ANGIOPLASTY  4/03   cutting balloon PTCA pLAD into Diag  . CORONARY ARTERY BYPASS GRAFT  2000   LIMA-LAD, SVG-RCA,  SVG-Diag; SVG-Diag & SVG-RCA occluded 2003  . FRACTURE SURGERY    . HAND SURGERY  08/27/09   R thumb procedure wit Scaphoid Gragt and screws, Dr [name]  . NM MYOVIEW LTD  4/11   normal  . PERIPHERAL VASCULAR CATHETERIZATION Right 06/27/2015   Procedure: Lower Extremity Angiography;  Surgeon: Algernon [name], MD;  Location: [facility];  Service: Cardiovascular;  Laterality: Right;  . PERIPHERAL VASCULAR CATHETERIZATION  06/27/2015   Procedure: Lower Extremity Intervention;  Surgeon: Algernon [name], MD;  Location: [facility];  Service: Cardiovascular;;   Family History  Problem Relation Age of Onset  . Stroke Father   . Aneurysm Father   . Hypertension Father   . Prostate cancer Brother   . Hypertension Mother   . Lung cancer Brother        smoker  . Thrombosis Brother   . Other Brother        RF valve disorder (smoker)  . Lung cancer Brother        smoker  . Breast cancer Sister   . Liver cancer Brother   . Other Sister        cerebral hemorrhage   Allergies  Allergen Reactions  . Isosorbide Mononitrate Other (See Comments)    Unknown, doesn't remember   . Ramipril Cough  . Quinidine Gluconate Rash      Assessment & Plan:  Patient presents for his second postoperative follow-up/wound check.  The patient's rehab center is refusing to allow his stump to be left open to air.  They said "it would not be a good idea".  They have been placing Silvadene to the left stump  incision and his right lateral foot wound.  The patient and his family seem to feel the Silvadene has made improvement to the appearance of both wounds.  Patient continues to reside at rehab.  His postoperative course continues to be unremarkable. Denies any drainage from his stump site. Denies any wound dehiscence. Patient denies any fever, nausea vomiting.  1. Hx of BKA, left ([place]) - Stable The patient's incision is looking better this week however I do not feel comfortable removing his staples just yet. I fear if I remove his staples due to the erythema and inflammation the top letter made to his. The patient is to continue placing the Silvadene dressing to the incision daily.  This seems to be helping with the erythema and irritation. I will see the patient back in 2 weeks hopefully at that time we can start to remove some staples.  2) Right Foot Wound - Stable Continue to place Silvadene to this area as well. Will have to continue to watch this area is dry without necrosis or cellulitis however is not showing much signs of improvement.  Current Outpatient Medications on File Prior to Visit  Medication Sig Dispense Refill  . acetaminophen (TYLENOL) 325 MG tablet Take 2 tablets (650 mg total) by mouth every 6 (six) hours as needed for mild pain (or temp >/= 101 F).    [name] amLODipine (NORVASC) 2.5 MG tablet Take 1 tablet (2.5 mg total) by mouth daily. 30 tablet 0  . apixaban (ELIQUIS) 2.5 MG TABS tablet Take 1 tablet (2.5 mg total) by mouth 2 (two) times daily. 180 tablet 1  . B Complex Vitamins (B COMPLEX-B12) TABS Take 1 tablet by mouth daily.     Roseanne Kaufman [place]-Castor Oil (VENELEX) OINT Apply 1 application topically 3 (three) times daily. Applied at every  shift day, evening, and night to bilateral buttocks/sacrum area    . Blood Glucose Monitoring Suppl (ONE TOUCH ULTRA SYSTEM KIT) W/DEVICE KIT 1 kit by Does not apply route once. 1 each 0  . calcium carbonate (TUMS - DOSED IN MG ELEMENTAL  CALCIUM) 500 MG chewable tablet Chew 1 tablet by mouth daily as needed for indigestion or heartburn.    . cholecalciferol (VITAMIN D) 1000 UNITS tablet Take 1,000 Units by mouth 2 (two) times a week. Takes on Mon and Fri    . Cinnamon 500 MG capsule Take 1,000 mg by mouth 2 (two) times daily.     . fenofibrate 160 MG tablet Take 1 tablet (160 mg total) by mouth daily. 90 tablet 2  . finasteride (PROSCAR) 5 MG tablet Take 1 tablet (5 mg total) by mouth daily. 30 tablet 0  . fish oil-omega-3 fatty acids 1000 MG capsule Take 1,000 mg by mouth daily.     [name] glucose blood test strip Use as instructed 100 each 3  . lisinopril (PRINIVIL,ZESTRIL) 20 MG tablet Take 1 tablet (20 mg total) by mouth daily. 30 tablet 0  . liver oil-zinc oxide (DESITIN) 40 % ointment Apply topically 3 (three) times daily between meals. 56.7 g 0  . metoprolol tartrate (LOPRESSOR) 25 MG tablet Take 0.5 tablets (12.5 mg total) by mouth 2 (two) times daily. 30 tablet 0  . Multiple Vitamin (DAILY MULTIVITAMIN PO) Take 1 tablet by mouth daily.      . nutrition supplement, JUVEN, (JUVEN) PACK Take 1 packet by mouth 2 (two) times daily between meals. 60 packet 0  . ondansetron (ZOFRAN) 4 MG tablet Take 4 mg by mouth every 6 (six) hours as needed for nausea or vomiting.    [name] DELICA LANCETS FINE MISC check sugar once daily 100 each 2  . pentoxifylline (TRENTAL) 400 MG CR tablet Take 1 tablet (400 mg total) by mouth 3 (three) times daily with meals. 90 tablet 3  . rosuvastatin (CRESTOR) 40 MG tablet Take 1 tablet (40 mg total) by mouth daily. 90 tablet 2  . tamsulosin (FLOMAX) 0.4 MG CAPS capsule Take 1 capsule (0.4 mg total) by mouth daily. 30 capsule 0   No current facility-administered medications on file prior to visit.    There are no Patient Instructions on file for this visit. No follow-ups on file.  Kien Mirsky A Jasline Buskirk, PA-C

## 2018-01-13 NOTE — Progress Notes (Signed)
This is an acute visit.  Level of care is skilled.  Facility is [facility].  Chief complaint.  Acute visit secondary to elevated white count   History of present illness.  Patient is a pleasant 84 year old maleseen today for an elevated white count of 14,200  He does have a history of coronary artery disease as well as atrial fibrillation chronic kidney disease type 2 diabetes peripheral arterial disease and hypertension  He is status post left below the knee amputation.as well as a right foot wound that apparently was previously a blister. He did see vein and vascular today and provider did make recommendations to--treatment the stump site with Silvadene and when he is not involved in activities physical therapy to leave it open to air open to air-with follow-up in one week for possible removal of staples Regards to the right foot wound  recommendation also is for Silvadene with a wound recheck next week  He underwent the BKA of the left leg in early May followed by a bout of C. Difficile colitis.  He was discharged to skilled nursingwas sent back for fever and elevated white count.  He was found to had Pseudomonas urosepsis.  He was sent back to skilled nursing and was sent back to the hospital for nausea and vomiting he  His vomiting resolved. Thought possibly secondary to antibiotics Cipro.  He continues to have some poor appetite but the vomiting has resolvedstill has some nausea at times and is on Zofran as needed.  Routine lab work done today shows an elevated white count of 14,200.  He is not really complaining of any fever or chills says he has a small amount of burning with urination he does have urinary retention and is on Proscar as well as Flomax.  He is not complaining of any fever or chills or increased cough or congestion  Vital signs are stable he is not running a fever  Past Medical History:  Diagnosis Date  . Atrial flutter ([place])    s/p RFCA  .  CAD (coronary artery disease)    cath 2003, occluded S-RCA, occluded S-Dx, L-LAD ok, s/p PTCA to LAD  . Cataract   . CKD (chronic kidney disease) stage 3, GFR 30-59 ml/min (HCC)   . Diabetes mellitus    diet controlled  . Hearing aid worn    bilateral  . History of kidney stones   . Long term (current) use of anticoagulants   . Neuromuscular disorder ([place])   . OSA (obstructive sleep apnea) 12/11   very mild, AHI 7/hr  . Persistent atrial fibrillation ([place]) 09/26/2015  . Pure hyperglyceridemia   . PVD (peripheral vascular disease) ([place])    angioplasty of his right lower extremity in [place] by Dr.[name] 2013  . Unspecified essential hypertension   . Wears dentures    full upper and lower        Past Surgical History:  Procedure Laterality Date  . ABDOMINAL AORTOGRAM W/LOWER EXTREMITY N/A 11/15/2017   Procedure: ABDOMINAL AORTOGRAM W/LOWER EXTREMITY;  Surgeon: [name], MD;  Location: [facility];  Service: Cardiovascular;  Laterality: N/A;  . Adenosine Myoview  3/06   EF 56%, neg. Ischemia  . Adenosine Myoview  02/18/07   nml  . AMPUTATION Left 12/15/2017   Procedure: AMPUTATION BELOW KNEE;  Surgeon: Algernon [name], MD;  Location: [facility];  Service: Vascular;  Laterality: Left;  . ANGIOPLASTY  1/99   CAD- diogonal with rotational artherectomy  . Arthrectomy  of LAD & PTCA  . BLEPHAROPLASTY Bilateral   . CARDIAC CATHETERIZATION  1/00  . CARDIOVERSION  1/04  . CARDIOVERSION  5/07   hospital- a flutter  . CARPAL TUNNEL RELEASE     ? bilateral  . CATARACT EXTRACTION W/PHACO Right 07/28/2017   Procedure: CATARACT EXTRACTION PHACO AND INTRAOCULAR LENS PLACEMENT ([place]) RIGHT DIABETIC;  Surgeon: [name], MD;  Location: [facility];  Service: Ophthalmology;  Laterality: Right;  Diabetic - diet controlled  . CATARACT EXTRACTION W/PHACO Left 08/18/2017   Procedure: CATARACT EXTRACTION PHACO AND INTRAOCULAR LENS  PLACEMENT ([facility]);  Surgeon: [name], MD;  Location: [facility];  Service: Ophthalmology;  Laterality: Left;  DIABETES - oral meds  . COLONOSCOPY W/ BIOPSIES  10/01/06   sigmoid polyp bx neg, 3 years  . CORONARY ANGIOPLASTY  4/03   cutting balloon PTCA pLAD into Diag  . CORONARY ARTERY BYPASS GRAFT  2000   LIMA-LAD, SVG-RCA, SVG-Diag; SVG-Diag & SVG-RCA occluded 2003  . FRACTURE SURGERY    . HAND SURGERY  08/27/09   R thumb procedure wit Scaphoid Gragt and screws, Dr [name]  . NM MYOVIEW LTD  4/11   normal  . PERIPHERAL VASCULAR CATHETERIZATION Right 06/27/2015   Procedure: Lower Extremity Angiography;  Surgeon: Algernon [name], MD;  Location: [facility];  Service: Cardiovascular;  Laterality: Right;  . PERIPHERAL VASCULAR CATHETERIZATION  06/27/2015   Procedure: Lower Extremity Intervention;  Surgeon: Algernon [name], MD;  Location: [facility];  Service: Cardiovascular;;    reports that he has never smoked. He quit smokeless tobacco use about 25 years ago. He reports that he does not drink alcohol or use drugs. Social History        Socioeconomic History  . Marital status: Married    Spouse name: Not on file  . Number of children: 5  . Years of education: Not on file  . Highest education level: Not on file  Occupational History  . Occupation: [employer]    Employer: RETIRED  Social Needs  . Financial resource strain: Not on file  . Food insecurity:    Worry: Not on file    Inability: Not on file  . Transportation needs:    Medical: Not on file    Non-medical: Not on file  Tobacco Use  . Smoking status: Never Smoker  . Smokeless tobacco: Former Network engineer and Sexual Activity  . Alcohol use: No  . Drug use: No  . Sexual activity: Never  Lifestyle  . Physical activity:    Days per week: Not on file    Minutes per session: Not on file  . Stress: Not on file  Relationships  . Social connections:      Talks on phone: Not on file    Gets together: Not on file    Attends religious service: Not on file    Active member of club or organization: Not on file    Attends meetings of clubs or organizations: Not on file    Relationship status: Not on file  . Intimate partner violence:    Fear of current or ex partner: Not on file    Emotionally abused: Not on file    Physically abused: Not on file    Forced sexual activity: Not on file  Other Topics Concern  . Not on file  Social History Narrative   Retired now does some antiques   Retired from Theatre manager and dye work  Married 1951   5 kids    Functional Status Survey:       Family History  Problem Relation Age of Onset  . Stroke Father   . Aneurysm Father   . Hypertension Father   . Prostate cancer Brother   . Hypertension Mother   . Lung cancer Brother        smoker  . Thrombosis Brother   . Other Brother        RF valve disorder (smoker)  . Lung cancer Brother        smoker  . Breast cancer Sister   . Liver cancer Brother   . Other Sister        cerebral hemorrhage        Health Maintenance  Topic Date Due  . TETANUS/TDAP  01/22/2018 (Originally 03/13/2014)  . FOOT EXAM  03/01/2018  . HEMOGLOBIN A1C  03/07/2018  . INFLUENZA VACCINE  03/10/2018  . OPHTHALMOLOGY EXAM  05/03/2018  . PNA vac Low Risk Adult  Completed         Allergies  Allergen Reactions  . Isosorbide Mononitrate Other (See Comments)    Unknown, doesn't remember   . Ramipril Cough  . Quinidine Gluconate Rash        Outpatient Encounter Medications as of 12/30/2017  Medication Sig  . acetaminophen (TYLENOL) 325 MG tablet Take 2 tablets (650 mg total) by mouth every 6 (six) hours as needed for mild pain (or temp >/= 101 F).  [name] amLODipine (NORVASC) 2.5 MG tablet Take 1 tablet (2.5 mg total) by mouth daily.  [name] apixaban (ELIQUIS) 2.5 MG TABS tablet Take 1 tablet (2.5 mg total) by mouth 2 (two) times  daily.  . B Complex Vitamins (B COMPLEX-B12) TABS Take 1 tablet by mouth daily.   Roseanne Kaufman Peru-Castor Oil (VENELEX) OINT Apply 1 application topically 3 (three) times daily. Applied at every shift day, evening, and night to bilateral buttocks/sacrum area  . Blood Glucose Monitoring Suppl (ONE TOUCH ULTRA SYSTEM KIT) W/DEVICE KIT 1 kit by Does not apply route once.  . calcium carbonate (TUMS - DOSED IN MG ELEMENTAL CALCIUM) 500 MG chewable tablet Chew 1 tablet by mouth daily as needed for indigestion or heartburn.  . cholecalciferol (VITAMIN D) 1000 UNITS tablet Take 1,000 Units by mouth 2 (two) times a week. Takes on Mon and Fri  . Cinnamon 500 MG capsule Take 1,000 mg by mouth 2 (two) times daily.   . fenofibrate 160 MG tablet Take 1 tablet (160 mg total) by mouth daily.  . finasteride (PROSCAR) 5 MG tablet Take 1 tablet (5 mg total) by mouth daily.  . fish oil-omega-3 fatty acids 1000 MG capsule Take 1,000 mg by mouth daily.   [name] glucose blood test strip Use as instructed  . lisinopril (PRINIVIL,ZESTRIL) 20 MG tablet Take 1 tablet (20 mg total) by mouth daily.  [name] liver oil-zinc oxide (DESITIN) 40 % ointment Apply topically 3 (three) times daily between meals.  . metoprolol tartrate (LOPRESSOR) 25 MG tablet Take 0.5 tablets (12.5 mg total) by mouth 2 (two) times daily.  . Multiple Vitamin (DAILY MULTIVITAMIN PO) Take 1 tablet by mouth daily.    . nutrition supplement, JUVEN, (JUVEN) PACK Take 1 packet by mouth 2 (two) times daily between meals.  . ondansetron (ZOFRAN) 4 MG tablet Take 4 mg by mouth every 6 (six) hours as needed for nausea or vomiting.  [name] DELICA LANCETS FINE MISC check sugar once daily  . pentoxifylline (  TRENTAL) 400 MG CR tablet Take 1 tablet (400 mg total) by mouth 3 (three) times daily with meals.  . rosuvastatin (CRESTOR) 40 MG tablet Take 1 tablet (40 mg total) by mouth daily.  . tamsulosin (FLOMAX) 0.4 MG CAPS capsule Take 1 capsule (0.4 mg total) by mouth daily.    . vancomycin (VANCOCIN) 50 mg/mL oral solution Take 2.5 mLs (125 mg total) by mouth every 6 (six) hours for 5 days.   No facility-administered encounter medications on file as of 12/30/2017.       review of systems.  In general he is not complaining of any fever or chills.  Skin does not complain of diaphoresis rashes or itching.  Head ears eyes nose mouth and throat does not complaining of sore throat or visual changes says he has an occasional headache   Respiratory is not complaining of increased shortness of breath or cough beyond baseline says occasionally has a little dry throat and subsequent cough  \cardiac is not complaining of chest pain.  GI is not complaining of constipation or any further diarrhea-he does continue to have some nausea is on Zofran  GU is not complaining of overt burning with urination but says it does sting a little bit when he passes urine.  Musculoskeletal is not complaining of joint pain at this time.  Neurologic is not complaining ofdizziness or syncope again says he has an occasional headache.  Psych he is not complaining of overt anxiety or depression does have somewhat of a flat effect   Physical exam.  Temperature is 97.5 pulse89 respirations 20--blood pressure is 120/65  n general this is a pleasant elderly male in no distress sitting omfortably in his wheelchair  His skin is warm and dry he is not diaphoretic.  Eyes sclera and conjunctiva are clear visual acuity appears grossly intact  Oropharynx is clear mucous membranes moist  Chest is clear to auscultation there is no labored breathing.  Heart is regular  irregular rate and rhythm without murmur gallop or rub.  Abdomen is soft does not appear to be tender there are positive bowel sounds.  Musculoskeletal --he is status post below the knee amputationon the left-stump site is currently wrapped perassessment by vein and vascular today this is thought to be healing fairly  well-some concern for possibility of Dehiscence on the lateral aspec  No sign of infection  In regards to the right foot wound this is also wrapped--per vein and  vascular there is a wound that will have to be watched--apparently this was a blister previously  He appears to move his other extremities at baseline  Neurologic is grossly intact to speech is clear no true lateralizing findings.  Psych he is alert and oriented pleasant and appropriate.  Labs.  01/13/2018.  Sodium is 133 potassium 4.4 BUN 42 creatinine 1.66.  White count is 14,200 hemoglobin 10.9 platelets 292  Assessment and plan.  Leukocytosis-was discussed with Dr. Bethena Midget he does have a history of urosepsis will obtain urinalysis and culture  He is also complaining of some mild stinging with urination.  Per assessment by vein vascular today apparently there is not a sign of infection of the stump side or the foot wound  These areas are currently covered.  He is not complaining of any increased cough or shortness of breath either  per patient he is not having diarrhea and staff does not note this either  Will also order vital signs pulse ox every shift notify provider of increased temperature  And will update a CBC with differential and metabolic panel on Monday, June 10.  I do note his creatinine has risen somewhat to 1.66 as well we will need to keep an eye on this.  #2 history of renal insufficiency creatinine has risen somewhat up to 1.66 baseline is in the lower ones we will monitor this and update a lab on Monday this was discussed with Dr. Lyndel Safe as well.    #3 history of C. Difficile this appears resolved it does not appear the elevated white count is recurrent C. Difficile but this l have to be monitored  #4-atrial fibrillation this appears to be rate controlled on Lopressor he continues on ELiquis  for anticoagulation  #5 history of urinary retention he continues on Proscar and Flomax and will  have urology follow-up apparently he still has some hesitancy when urinating  #6 history of type 2 diabetes this appears stable recent CBGs have been in the lower to mid 100s this is diet-controlled  #7 history of hyponatremia  he does have some chronicity to this this appears stable with a sodium of 133 on today's lab again this will be updated early next week  #8 history of peripheral vascular disease he is on [place] all he is followed by veinand vascular as noted above  And is status post left but note the knee amputation  He will have follow-up by vein and vascular next week  9 continued nausea he does have an order for Zofran as needed apparently this is helping some--I did speak with patient and family-I also spoke with Dr. Lyndel Safe about possible GI consult again will monitor   --consultcould be a real possibility here in the near future if does not improve  Patient does not report having a previous significant GI history or seeing a gastroenterologist in the past  [id number]-of note greater than 40 minutes spent assessing patient-reviewing his chart and labs as well as consult notes-discussing his status with nursing as well as with Dr. Corrinne Eagle coordinating and formulating a plan of care for numerous diagnoses-of note greater than 50% of time spent coordinating a plan of care with input as noted above

## 2018-01-14 ENCOUNTER — Other Ambulatory Visit: Payer: Self-pay | Admitting: *Deleted

## 2018-01-14 ENCOUNTER — Encounter: Payer: Self-pay | Admitting: Internal Medicine

## 2018-01-14 NOTE — Patient Outreach (Signed)
[facility]) Care Management  01/14/2018  Eric [name]. 04-18-1928 [id number]  Met with patient, his wife, and his daughter Eric [name]. Patient sitting up in wheelchair, neatly groomed and dressed. He states he is much better, he still has the contact precautions on door, however he says that they are not in effect anymore.  Patient looking forward to going home soon, however, he has not heard of a discharge date.  Patient wife asking about transportation resources, they actually live in [place].   RNCM reviewed [facility] program and patient agrees to program.  Plan to refer to [facility] LCSW to follow while in the SNF and to check for any available transportation resources.  Will request that LCSW refer to [facility] for Transition of care upon discharge.  Eric [name]. Eric Purser, RN, BSN, [place] [phone]) Business Cell  [phone]) Toll Free Office

## 2018-01-16 LAB — URINE CULTURE: Culture: >=100000 — AB

## 2018-01-17 ENCOUNTER — Encounter (HOSPITAL_COMMUNITY)
Admission: RE | Admit: 2018-01-17 | Discharge: 2018-01-17 | Disposition: A | Discharge status: Home / Self Care | Payer: PPO | Source: Skilled Nursing Facility | Attending: Internal Medicine | Admitting: Internal Medicine

## 2018-01-17 DIAGNOSIS — D631 Anemia in chronic kidney disease: Secondary | ICD-10-CM | POA: Insufficient documentation

## 2018-01-17 DIAGNOSIS — S88112D Complete traumatic amputation at level between knee and ankle, left lower leg, subsequent encounter: Secondary | ICD-10-CM | POA: Insufficient documentation

## 2018-01-17 DIAGNOSIS — Z4781 Encounter for orthopedic aftercare following surgical amputation: Secondary | ICD-10-CM | POA: Insufficient documentation

## 2018-01-17 DIAGNOSIS — N179 Acute kidney failure, unspecified: Secondary | ICD-10-CM | POA: Insufficient documentation

## 2018-01-18 ENCOUNTER — Encounter (HOSPITAL_COMMUNITY)
Admission: RE | Admit: 2018-01-18 | Discharge: 2018-01-18 | Disposition: A | Discharge status: Home / Self Care | Payer: PPO | Source: Skilled Nursing Facility | Attending: Internal Medicine | Admitting: Internal Medicine

## 2018-01-18 ENCOUNTER — Other Ambulatory Visit: Payer: Self-pay | Admitting: *Deleted

## 2018-01-18 DIAGNOSIS — R338 Other retention of urine: Secondary | ICD-10-CM | POA: Diagnosis not present

## 2018-01-18 DIAGNOSIS — M6281 Muscle weakness (generalized): Secondary | ICD-10-CM | POA: Insufficient documentation

## 2018-01-18 LAB — CBC WITH DIFFERENTIAL/PLATELET
BASOS PCT: 1 %
Basophils Absolute: 0.1 10*3/uL (ref 0.0–0.1)
EOS ABS: 0.5 10*3/uL (ref 0.0–0.7)
Eosinophils Relative: 5 %
HCT: 32.2 % — ABNORMAL LOW (ref 39.0–52.0)
HEMOGLOBIN: 10.5 g/dL — AB (ref 13.0–17.0)
Lymphocytes Relative: 29 %
Lymphs Abs: 3.2 10*3/uL (ref 0.7–4.0)
MCH: 29.8 pg (ref 26.0–34.0)
MCHC: 32.6 g/dL (ref 30.0–36.0)
MCV: 91.5 fL (ref 78.0–100.0)
Monocytes Absolute: 1 10*3/uL (ref 0.1–1.0)
Monocytes Relative: 9 %
NEUTROS PCT: 56 %
Neutro Abs: 6.1 10*3/uL (ref 1.7–7.7)
PLATELETS: 244 10*3/uL (ref 150–400)
RBC: 3.52 MIL/uL — AB (ref 4.22–5.81)
RDW: 15.2 % (ref 11.5–15.5)
WBC: 10.9 10*3/uL — AB (ref 4.0–10.5)

## 2018-01-18 LAB — BASIC METABOLIC PANEL
Anion gap: 8 (ref 5–15)
BUN: 35 mg/dL — ABNORMAL HIGH (ref 6–20)
CALCIUM: 9.2 mg/dL (ref 8.9–10.3)
CO2: 23 mmol/L (ref 22–32)
CREATININE: 1.34 mg/dL — AB (ref 0.61–1.24)
Chloride: 100 mmol/L — ABNORMAL LOW (ref 101–111)
GFR, EST AFRICAN AMERICAN: 53 mL/min — AB (ref >60)
GFR, EST NON AFRICAN AMERICAN: 46 mL/min — AB (ref >60)
Glucose, Bld: 84 mg/dL (ref 65–99)
Potassium: 5.3 mmol/L — ABNORMAL HIGH (ref 3.5–5.1)
SODIUM: 131 mmol/L — AB (ref 135–145)

## 2018-01-18 NOTE — Patient Outreach (Signed)
[facility]) Care Management  01/18/2018  Eric [name]. 10-07-1927 [id number]   CSW had received referral from [facility] that patient was admitted to [facility] SNF. CSW met with patient at SNF (room#: [id number]) to discuss discharge plans. Patient reports that he plans to return home with his wife, Eric [name] and help from his 5 children, 11 grandchildren & 14 great-grandchildren. Patient has a very positive attitude, despite having had left leg amputated. Patient requested information regarding transportation as he will now be wheelchair bound until he is able to get a prosthesis. CSW spoke with patient about SCAT and application process. CSW will plan to visit patient in 1 week to provide application for SCAT.    Raynaldo Opitz, LCSW [facility]  Clinical Social Worker cell #: [phone]

## 2018-01-19 ENCOUNTER — Non-Acute Institutional Stay (SKILLED_NURSING_FACILITY): Payer: PPO | Admitting: Internal Medicine

## 2018-01-19 ENCOUNTER — Encounter: Payer: Self-pay | Admitting: Internal Medicine

## 2018-01-19 DIAGNOSIS — E871 Hypo-osmolality and hyponatremia: Secondary | ICD-10-CM | POA: Diagnosis not present

## 2018-01-19 DIAGNOSIS — N390 Urinary tract infection, site not specified: Secondary | ICD-10-CM | POA: Diagnosis not present

## 2018-01-19 DIAGNOSIS — E875 Hyperkalemia: Secondary | ICD-10-CM

## 2018-01-19 DIAGNOSIS — I10 Essential (primary) hypertension: Secondary | ICD-10-CM

## 2018-01-19 DIAGNOSIS — T83511D Infection and inflammatory reaction due to indwelling urethral catheter, subsequent encounter: Secondary | ICD-10-CM | POA: Diagnosis not present

## 2018-01-19 DIAGNOSIS — I481 Persistent atrial fibrillation: Secondary | ICD-10-CM | POA: Diagnosis not present

## 2018-01-19 DIAGNOSIS — I4819 Other persistent atrial fibrillation: Secondary | ICD-10-CM

## 2018-01-19 NOTE — Progress Notes (Signed)
Location:   [facility] Room Number: 133/P Place of Service:  SNF [phone]) Provider:  Elige Radon, MD  Patient Care Team: [name], MD as PCP - General (Family Medicine) Standley Brooking, LCSW as [facility] (Licensed Clinical Social Worker)  Extended Emergency Contact Information Primary Emergency Contact: Omara,Lillie Address: [address], [place] [address] [name] of [place] Phone: [phone] Mobile Phone: [phone] Relation: Spouse Secondary Emergency Contact: Larri, Yehle,  [address] [name] of [place] Phone: [phone] Relation: Son  Code Status:  Full Code Goals of care: Advanced Directive information Advanced Directives 01/19/2018  Does Patient Have a Medical Advance Directive? Yes  Type of Advance Directive (No Data)  Does patient want to make changes to medical advance directive? No - Patient declined  Copy of [place] in Chart? -  Would patient like information on creating a medical advance directive? No - Patient declined     Chief Complaint  Patient presents with  . Acute Visit    Patient being seen for Hyperkalemia  Also follow-up UTI  HPI:  Pt is a 84 y.o. male seen today for an acute visit for follow-up of mild hyperkalemia- as well as follow-up of UTI  He does have a history of coronary artery disease as well as atrial fibrillation chronic kidney disease type 2 diabetes peripheral arterial disease and hypertension   He is status post left below the knee amputation.as well as a right foot wound that apparently was previously a blister. He did see vein and vascular last week   and provider did make recommendations to--treatment the stump site with Silvadene and when he is not involved in activities physical therapy to leave it open to air open to air-with follow-up in one week for  possible removal of staples Regards to the right foot wound  recommendation also is for Silvadene with a wound recheck in one week.[name]  He did undergo a BKA of the left leg in early May followed by course of C. difficile colitis-.  Subsequently he was discharged to skilled nursing but was sent back for fever and elevated white count and was found to have Pseudomonas urosepsis  He was treated and discharged on Cipro but was sent back to the hospital because of nausea and vomiting and this did resolve a thought secondary to the antibiotic.  When I saw him last week he continued to have a poor appetite -- vomiting had improved he did receive Zofran as needed for nausea  He was noted to have a white count of over 14,000- we ordered a urinalysis and culture which came back positive for Pseudomonas.  He was started on a course of Rocephin which he is completing In today he appears to be feeling better.  White count has come down to 10,900 he is afebrile and actually was rolling about the facility in his wheelchair when I found.him  He says he is feeling significantly better   The lab done yesterday did show mild hyperkalemia at 5.3 it appears he has had some of this in the past with a potassium of 5.4 about 3 weeks ago and then became normal.  He is not on any potassium supplementation he is on lisinopril 20 mg a day  His blood pressure appears to be stable at about 120/58  manually today I see previous readings 122/65- 123/62-141/70- I do see one 100/59 but this appears fairly rare a         Past Medical History:  Diagnosis Date  . Atrial flutter ([place] [facility])    s/p RFCA  . CAD (coronary artery disease)    cath 2003, occluded S-RCA, occluded S-Dx, L-LAD ok, s/p PTCA to LAD  . Cataract   . CKD (chronic kidney disease) stage 3, GFR 30-59 ml/min (HCC)   . Diabetes mellitus    diet controlled  . Hearing aid worn    bilateral  . History of kidney stones   . Long term (current) use of  anticoagulants   . Neuromuscular disorder ([place])   . OSA (obstructive sleep apnea) 12/11   very mild, AHI 7/hr  . Persistent atrial fibrillation ([facility]) 09/26/2015  . Pure hyperglyceridemia   . PVD (peripheral vascular disease) ([facility] [place])    angioplasty of his right lower extremity in [place] by Dr.[name] 2013  . Unspecified essential hypertension   . Wears dentures    full upper and lower   Past Surgical History:  Procedure Laterality Date  . ABDOMINAL AORTOGRAM W/LOWER EXTREMITY N/A 11/15/2017   Procedure: ABDOMINAL AORTOGRAM W/LOWER EXTREMITY;  Surgeon: [name], MD;  Location: [facility];  Service: Cardiovascular;  Laterality: N/A;  . Adenosine Myoview  3/06   EF 56%, neg. Ischemia  . Adenosine Myoview  02/18/07   nml  . AMPUTATION Left 12/15/2017   Procedure: AMPUTATION BELOW KNEE;  Surgeon: Algernon [name], MD;  Location: [facility];  Service: Vascular;  Laterality: Left;  . ANGIOPLASTY  1/99   CAD- diogonal with rotational artherectomy  . Arthrectomy     of LAD & PTCA  . BLEPHAROPLASTY Bilateral   . CARDIAC CATHETERIZATION  1/00  . CARDIOVERSION  1/04  . CARDIOVERSION  5/07   hospital- a flutter  . CARPAL TUNNEL RELEASE     ? bilateral  . CATARACT EXTRACTION W/PHACO Right 07/28/2017   Procedure: CATARACT EXTRACTION PHACO AND INTRAOCULAR LENS PLACEMENT ([facility]) RIGHT DIABETIC;  Surgeon: [name], MD;  Location: [facility];  Service: Ophthalmology;  Laterality: Right;  Diabetic - diet controlled  . CATARACT EXTRACTION W/PHACO Left 08/18/2017   Procedure: CATARACT EXTRACTION PHACO AND INTRAOCULAR LENS PLACEMENT ([facility]);  Surgeon: [name], MD;  Location: [facility];  Service: Ophthalmology;  Laterality: Left;  DIABETES - oral meds  . COLONOSCOPY W/ BIOPSIES  10/01/06   sigmoid polyp bx neg, 3 years  . CORONARY ANGIOPLASTY  4/03   cutting balloon PTCA pLAD into Diag  . CORONARY ARTERY BYPASS GRAFT  2000   LIMA-LAD, SVG-RCA, SVG-Diag;  SVG-Diag & SVG-RCA occluded 2003  . FRACTURE SURGERY    . HAND SURGERY  08/27/09   R thumb procedure wit Scaphoid Gragt and screws, Dr [name]  . NM MYOVIEW LTD  4/11   normal  . PERIPHERAL VASCULAR CATHETERIZATION Right 06/27/2015   Procedure: Lower Extremity Angiography;  Surgeon: Algernon [name], MD;  Location: [facility];  Service: Cardiovascular;  Laterality: Right;  . PERIPHERAL VASCULAR CATHETERIZATION  06/27/2015   Procedure: Lower Extremity Intervention;  Surgeon: Algernon [name], MD;  Location: [facility];  Service: Cardiovascular;;    Allergies  Allergen Reactions  . Isosorbide Mononitrate Other (See Comments)    Unknown, doesn't remember   . Ramipril Cough  . Quinidine Gluconate Rash    Outpatient Encounter Medications as of 01/19/2018  Medication Sig  .  acetaminophen (TYLENOL) 325 MG tablet Take 2 tablets (650 mg total) by mouth every 6 (six) hours as needed for mild pain (or temp >/= 101 F).  [name] amLODipine (NORVASC) 2.5 MG tablet Take 1 tablet (2.5 mg total) by mouth daily.  [name] apixaban (ELIQUIS) 2.5 MG TABS tablet Take 1 tablet (2.5 mg total) by mouth 2 (two) times daily.  . B Complex Vitamins (B COMPLEX-B12) TABS Take 1 tablet by mouth daily.   Roseanne Kaufman [place]-Castor Oil (VENELEX) OINT Apply 1 application topically 3 (three) times daily. Applied at every shift day, evening, and night to bilateral buttocks/sacrum area  . Blood Glucose Monitoring Suppl (ONE TOUCH ULTRA SYSTEM KIT) W/DEVICE KIT 1 kit by Does not apply route once.  . calcium carbonate (TUMS - DOSED IN MG ELEMENTAL CALCIUM) 500 MG chewable tablet Chew 1 tablet by mouth daily as needed for indigestion or heartburn.  . cefTRIAXone (ROCEPHIN) 1 g injection Inject 1 g into the muscle daily. Form 01/15/2018-01/22/2018  . cholecalciferol (VITAMIN D) 1000 UNITS tablet Take 1,000 Units by mouth 2 (two) times a week. Takes on Mon and Fri  . Cinnamon 500 MG capsule Take 1,000 mg by mouth 2 (two) times daily.   .  fenofibrate 160 MG tablet Take 1 tablet (160 mg total) by mouth daily.  . finasteride (PROSCAR) 5 MG tablet Take 1 tablet (5 mg total) by mouth daily.  . fish oil-omega-3 fatty acids 1000 MG capsule Take 1,000 mg by mouth daily.   [name] glucose blood test strip Use as instructed  . lisinopril (PRINIVIL,ZESTRIL) 20 MG tablet Take 1 tablet (20 mg total) by mouth daily.  [name] liver oil-zinc oxide (DESITIN) 40 % ointment Apply topically 3 (three) times daily between meals.  . metoprolol tartrate (LOPRESSOR) 25 MG tablet Take 0.5 tablets (12.5 mg total) by mouth 2 (two) times daily.  . Multiple Vitamin (DAILY MULTIVITAMIN PO) Take 1 tablet by mouth daily.    . ondansetron (ZOFRAN) 4 MG tablet Take 4 mg by mouth every 6 (six) hours as needed for nausea or vomiting.  [name] DELICA LANCETS FINE MISC check sugar once daily  . pentoxifylline (TRENTAL) 400 MG CR tablet Take 1 tablet (400 mg total) by mouth 3 (three) times daily with meals.  . Probiotic Product (RISA-BID PROBIOTIC) TABS Give 1 tablet by mouth twice a day from 01/15/2018-01/25/2018  . rosuvastatin (CRESTOR) 40 MG tablet Take 1 tablet (40 mg total) by mouth daily.  . silver sulfADIAZINE (SILVADENE) 1 % cream Apply 1 application topically daily. Place to left lateral staple incision also place to right lateral foot  . tamsulosin (FLOMAX) 0.4 MG CAPS capsule Take 1 capsule (0.4 mg total) by mouth daily.  . [DISCONTINUED] nutrition supplement, JUVEN, (JUVEN) PACK Take 1 packet by mouth 2 (two) times daily between meals.   No facility-administered encounter medications on file as of 01/19/2018.     Review of Systems   In general he is not complaining of fever chills says he feels significantly better  Head ears eyes nose mouth and throat does not complain of any sore throat or visual changes  Respiratory does not complain of increased shortness of breath or cough.  Cardiac is not complaining of any chest pain.  GI- says his nausea has improved  -- says his appetite has improved some--does not complain of constipation or diarrhea does have a history of C. Difficile  GU s he has occasional some burning with urination  Musculoskeletal is not complaining of joint  pain.   Neurologic is not complaining of dizziness syncope says occasionally he will have a headache but is not complaining of that today  Psych he does not appear anxious or depressed says he is going home later this week and looking forward to it  Immunization History  Administered Date(s) Administered  . Influenza Split 07/14/2011, 06/02/2012  . Influenza Whole 05/24/2005, 05/20/2007, 05/21/2008, 07/19/2009, 05/28/2010  . Influenza, High Dose Seasonal PF 05/03/2017  . Influenza,inj,Quad PF,6+ Mos 04/11/2013, 04/20/2014, 05/06/2015, 06/02/2016  . Influenza-Unspecified 05/03/2017  . Pneumococcal Conjugate-13 05/06/2015  . Pneumococcal Polysaccharide-23 10/17/2013  . Td 03/13/2004   Pertinent  Health Maintenance Due  Topic Date Due  . FOOT EXAM  03/01/2018  . HEMOGLOBIN A1C  03/07/2018  . INFLUENZA VACCINE  03/10/2018  . OPHTHALMOLOGY EXAM  05/03/2018  . PNA vac Low Risk Adult  Completed   Fall Risk  07/07/2017 06/30/2017 05/03/2017 05/03/2017 12/24/2015  Falls in the past year? No No No Yes No  Number falls in past yr: - - - 1 -  Injury with Fall? - - - No -  Risk for fall due to : - - - - -  Risk for fall due to: Comment - - - - -  Follow up - - Falls evaluation completed;Falls prevention discussed Falls evaluation completed -   Functional Status Survey:     He is afebrile pulse of 70 respirations of 18 blood pressure of 120/58 Physical Exam   In general this is a pleasant elderly male in no distress sitting comfortably in his wheelchair.  He appears to be a bit more energetic than I have seen him previously.  His skin is warm and dry.  Eyes sclera and conjunctive are clear visual acuity appears intact.  Oropharynx is clear mucous membranes  moist.  Chest is clear to auscultation there is no labored breathing.  Heart is irregular irregular rate and rhythm without murmur gallop or rub.  Abdomen is soft nontender with positive bowel sounds.  Muscle skeletal is status post below the knee amputation on the left-stump site currently is wrapped.  Moves other extremities at baseline is ambulatory in wheelchair   Also has a right foot wound which is wrapped.  Neurologic is grossly intact his speech is clear no lateralizing findings.  Psych he continues to be alert and oriented pleasant and appropriate   Labs reviewed: Recent Labs    12/19/17 0606  12/24/17 0529 12/25/17 0715  01/06/18 0749 01/13/18 0700 01/18/18 0230  NA 135   < > 130* 133*   < > 135 133* 131*  K 3.9   < > 4.3 4.7   < > 4.2 4.4 5.3*  CL 106   < > 101 100*   < > 102 101 100*  CO2 26   < > 23 25   < > '24 24 23  ' GLUCOSE 109*   < > 180* 115*   < > 113* 109* 84  BUN 21*   < > 21* 19   < > 32* 42* 35*  CREATININE 1.03   < > 0.88 0.79   < > 1.32* 1.66* 1.34*  CALCIUM 8.3*   < > 8.2* 8.8*   < > 9.3 9.4 9.2  MG 2.0  --  1.4* 1.5*  --   --   --   --    < > = values in this interval not displayed.   Recent Labs    12/25/17 0715 12/26/17 2041 12/27/17 0457  AST 60* 70* 50*  ALT 35 44 35  ALKPHOS 82 98 77  BILITOT 0.6 0.6 0.6  PROT 6.3* 6.2* 5.5*  ALBUMIN 2.6* 2.6* 2.3*   Recent Labs    12/30/17 0740 01/06/18 0749 01/13/18 0700 01/18/18 0230  WBC 10.2 9.7 14.2* 10.9*  NEUTROABS 6.3 5.7  --  6.1  HGB 9.9* 11.2* 10.9* 10.5*  HCT 30.3* 35.1* 34.3* 32.2*  MCV 92.7 92.9 93.7 91.5  PLT 376 343 292 244   Lab Results  Component Value Date   TSH 3.428 04/30/2015   Lab Results  Component Value Date   HGBA1C 6.0 (H) 09/07/2017   Lab Results  Component Value Date   CHOL 103 09/07/2017   HDL 21 (L) 09/07/2017   LDLCALC 61 09/07/2017   LDLDIRECT 99.1 02/19/2011   TRIG 128 09/07/2017   CHOLHDL 4.9 09/07/2017    Significant Diagnostic  Results in last 30 days:  Ct Abdomen Pelvis Wo Contrast  Result Date: 12/26/2017 CLINICAL DATA:  84 year old male with acute abdominal pain, nausea and vomiting today. EXAM: CT ABDOMEN AND PELVIS WITHOUT CONTRAST TECHNIQUE: Multidetector CT imaging of the abdomen and pelvis was performed following the standard protocol without IV contrast. COMPARISON:  12/22/2017 and prior CTs FINDINGS: Please note that parenchymal abnormalities may be missed without intravenous contrast. Lower chest: No acute abnormality.  Cardiomegaly identified. Hepatobiliary: Probable hepatic cysts again noted. A 1.5 cm gallstone is identified without CT evidence of acute cholecystitis. No biliary dilatation. Pancreas: Equivocal minimal stranding along the pancreatic head noted and could represent mild pancreatitis. No other pancreatic abnormalities noted. Spleen: Unremarkable Adrenals/Urinary Tract: The kidneys and adrenal glands are unremarkable except for bilateral renal cortical atrophy. No hydronephrosis or urinary calculi. A Foley catheter is present within the bladder. Bladder diverticula again identified. Stomach/Bowel: Stomach is within normal limits. Appendix appears normal. No evidence of bowel wall thickening, distention, or inflammatory changes. Colonic diverticulosis noted without evidence of diverticulitis. Vascular/Lymphatic: Aortic atherosclerosis. No enlarged abdominal or pelvic lymph nodes. Reproductive: Prostate is unremarkable. Other: No free fluid, focal collection or pneumoperitoneum. Musculoskeletal: No acute bony abnormality. Bilateral L5 pars defects with grade 1 anterolisthesis of L5 on S1 again noted. No suspicious bony lesions identified. IMPRESSION: 1. Equivocal minimal stranding along the pancreatic head which could represent mild pancreatitis. No acute fluid collection. 2. Cholelithiasis without CT evidence of acute cholecystitis 3. Cardiomegaly 4. Unchanged bilateral L5 pars defects with grade 1  spondylolisthesis at L5-S1 5.  Aortic Atherosclerosis (ICD10-I70.0). Electronically Signed   By: Margarette Canada M.D.   On: 12/26/2017 21:41   Ct Abdomen Pelvis Wo Contrast  Result Date: 12/22/2017 CLINICAL DATA:  Fever and elevated white count EXAM: CT ABDOMEN AND PELVIS WITHOUT CONTRAST TECHNIQUE: Multidetector CT imaging of the abdomen and pelvis was performed following the standard protocol without IV contrast. COMPARISON:  04/21/2016 FINDINGS: Lower chest: Lung bases demonstrate no acute consolidation or pleural effusion. Lower sternotomy changes. Cardiomegaly with coronary vascular calcification. Hepatobiliary: Cyst in the left hepatic lobe. Additional subcentimeter hypodensities in the right lobe too small to further characterize. Calcified stone in the gallbladder. No biliary dilatation. Pancreas: Unremarkable. No pancreatic ductal dilatation or surrounding inflammatory changes. Spleen: Normal in size without focal abnormality. Adrenals/Urinary Tract: Adrenal glands are within normal limits. No hydronephrosis. Air in the bladder. Foley catheter in the bladder. Thick-walled appearance of the bladder with irregular margin. Stomach/Bowel: Stomach is within normal limits. Appendix appears normal. No evidence of bowel wall thickening, distention, or inflammatory changes. Sigmoid and descending colon  diverticular disease without acute inflammation Vascular/Lymphatic: Moderate aortic atherosclerosis. No aneurysmal dilatation. No significantly enlarged lymph nodes Reproductive: Enlarged prostate gland Other: Negative for free air or free fluid. Musculoskeletal: Degenerative changes. No acute or suspicious abnormality. IMPRESSION: 1. Thick wall irregular appearance of the bladder, which may be secondary to cystitis or chronic obstruction. Bladder is decompressed by Foley catheter. 2. Otherwise no CT evidence for acute intra-abdominal or pelvic abnormality. 3. Gallstone 4. Diverticular disease of the colon without  acute inflammation 5. Cardiomegaly Electronically Signed   By: Donavan Foil M.D.   On: 12/22/2017 22:12   Dg Chest 2 View  Result Date: 12/26/2017 CLINICAL DATA:  Nausea and vomiting EXAM: CHEST - 2 VIEW COMPARISON:  12/22/2017, 08/26/2010 FINDINGS: Post sternotomy changes. No pleural effusion. Cardiomegaly. No focal airspace disease. No pneumothorax. Mild ankylosis of the spine with mild mid to lower vertebral wedging. IMPRESSION: No active cardiopulmonary disease.  Cardiomegaly Electronically Signed   By: Donavan Foil M.D.   On: 12/26/2017 21:41   Dg Chest 2 View  Result Date: 12/22/2017 CLINICAL DATA:  Fever with weakness and nausea EXAM: CHEST - 2 VIEW COMPARISON:  08/26/2010 FINDINGS: Post sternotomy changes. Mild hyperinflation. No focal airspace disease or pleural effusion. Mild cardiomegaly. No pneumothorax. Mild degenerative changes of the spine. IMPRESSION: No active cardiopulmonary disease.  Mild cardiomegaly. Electronically Signed   By: Donavan Foil M.D.   On: 12/22/2017 20:05    Assessment/Plan  #1 UTI positive for Pseudomonas he is completing a course of Rocephin --leukocytosis has improved- is afebrile he says he is feeling better--at this point will monitor.  He is also on a probiotic he does have a history of C. difficile in the past.  2.  Minimal hyperkalemia- this was discussed with Dr. Lyndel Safe and will monitor for now- will update a metabolic panel on Friday-he is not on potassium he is on lisinopril but blood pressures appear to be well controlled with this- again at this point will monitor it appears at times he will have mild elevations that resolved  #3 history of renal insufficiency this actually appears improved from previous level creatinine was 1.66 on earlier lab but on lab yesterday is down to 1.34--suspect treatment for UTI may be helping with this.  4.  Atrial fibrillation-- he is on Lopressor and Eliquis for anticoagulation   5 history of urinary retention he  is on Proscar and Flomax and will have urology follow-up again he is being treated for UTI  #6 history of hyponatremia appears relatively stable with a sodium of 131 on lab done yesterday again this will be updated on Friday  [id number]

## 2018-01-20 ENCOUNTER — Non-Acute Institutional Stay (SKILLED_NURSING_FACILITY): Payer: PPO | Admitting: Internal Medicine

## 2018-01-20 ENCOUNTER — Encounter: Payer: Self-pay | Admitting: Internal Medicine

## 2018-01-20 DIAGNOSIS — T83511D Infection and inflammatory reaction due to indwelling urethral catheter, subsequent encounter: Secondary | ICD-10-CM | POA: Diagnosis not present

## 2018-01-20 DIAGNOSIS — N390 Urinary tract infection, site not specified: Secondary | ICD-10-CM

## 2018-01-20 DIAGNOSIS — I739 Peripheral vascular disease, unspecified: Secondary | ICD-10-CM

## 2018-01-20 DIAGNOSIS — I4819 Other persistent atrial fibrillation: Secondary | ICD-10-CM

## 2018-01-20 DIAGNOSIS — R112 Nausea with vomiting, unspecified: Secondary | ICD-10-CM | POA: Diagnosis not present

## 2018-01-20 DIAGNOSIS — I481 Persistent atrial fibrillation: Secondary | ICD-10-CM | POA: Diagnosis not present

## 2018-01-20 DIAGNOSIS — Z89512 Acquired absence of left leg below knee: Secondary | ICD-10-CM

## 2018-01-20 DIAGNOSIS — I10 Essential (primary) hypertension: Secondary | ICD-10-CM | POA: Diagnosis not present

## 2018-01-20 NOTE — Progress Notes (Signed)
Location:   [facility] Room Number: 133/P Place of Service:  SNF (31)  Provider: Granville Lewis  PCP: [name], MD Patient Care Team: [name], MD as PCP - General (Family Medicine) Estill Dooms as [place] Management (Licensed Clinical Social Worker)  Extended Emergency Contact Information Primary Emergency Contact: Griffing,Lillie Address: [address], [place] [address] [name] of [place] Phone: [phone] Mobile Phone: [phone] Relation: Spouse Secondary Emergency Contact: [place], [place], [place] [address] [name] of [place] Phone: [phone] Relation: Son  Code Status: Full Code  Goals of care:  Advanced Directive information Advanced Directives 01/20/2018  Does Patient Have a Medical Advance Directive? Yes  Type of Advance Directive (No Data)  Does patient want to make changes to medical advance directive? No - Patient declined  Copy of [place] in Chart? -  Would patient like information on creating a medical advance directive? No - Patient declined     Allergies  Allergen Reactions  . Isosorbide Mononitrate Other (See Comments)    Unknown, doesn't remember   . Ramipril Cough  . Quinidine Gluconate Rash    Chief Complaint  Patient presents with  . Discharge Note    Patient being sen for Discharge Visit    HPI:  84 y.o. male is seen today for discharge from facility this weekend   He does have a history of coronary artery disease as well as atrial fibrillation chronic kidney disease type 2 diabetes peripheral arterial disease and hypertension   He is status post left below the knee amputation.as well as a right foot wound that apparently was previously a blister. He did see vein and vascular last week   and provider did make recommendations to--treatment the stump site with Silvadene and when he is  not involved in activities  like physical therapy to leave it open to air open to air-with follow-up in one week for possible removal of staples Regards to the right foot wound recommendation awas for Silvadene with a wound recheck in one week.[name]  He did undergo a BKA of the left leg in early May followed by course of C. difficile colitis-.  Subsequently he was discharged to skilled nursing but was sent back for fever and elevated white count and was found to have Pseudomonas urosepsis  He was treated and discharged on Cipro but was sent back to the hospital because of nausea and vomiting and this did resolve a thought secondary to the antibiotic.  When I saw him last week he continued to have a poor appetite -- vomiting had improved he did receive Zofran as needed for nausea  He was noted to have a white count of over 14,000- we ordered a urinalysis and culture which came back positive for Pseudomonas.  He was started on a course of Rocephin which he is completing last dose will be on June 15 his white count has come down to 10,900 doing better he is afebrile continues to say his nausea has improved  he is sitting in his wheelchair visiting with family this afternoon--continues to be in good spirits looking forward to going home--he will be with his wife agraffe he will need continued PT and OT as well as follow-up by vascular  and nursing support  Lab done earlier this week did show mild hyperkalemia at  5.3- we are updating this tomorrow-he is not on any potassium-he is on an ACE inhibitor-  He appears to have some mild hyperkalemia in the past that resolved Past Medical History:  Diagnosis Date  . Atrial flutter ([place])    s/p RFCA  . CAD (coronary artery disease)    cath 2003, occluded S-RCA, occluded S-Dx, L-LAD ok, s/p PTCA to LAD  . Cataract   . CKD (chronic kidney disease) stage 3, GFR 30-59 ml/min (HCC)   . Diabetes mellitus    diet controlled  . Hearing aid worn     bilateral  . History of kidney stones   . Long term (current) use of anticoagulants   . Neuromuscular disorder ([place])   . OSA (obstructive sleep apnea) 12/11   very mild, AHI 7/hr  . Persistent atrial fibrillation ([place]) 09/26/2015  . Pure hyperglyceridemia   . PVD (peripheral vascular disease) ([place])    angioplasty of his right lower extremity in [place] by Dr.[name] 2013  . Unspecified essential hypertension   . Wears dentures    full upper and lower    Past Surgical History:  Procedure Laterality Date  . ABDOMINAL AORTOGRAM W/LOWER EXTREMITY N/A 11/15/2017   Procedure: ABDOMINAL AORTOGRAM W/LOWER EXTREMITY;  Surgeon: [name], MD;  Location: [place] [facility];  Service: Cardiovascular;  Laterality: N/A;  . Adenosine Myoview  3/06   EF 56%, neg. Ischemia  . Adenosine Myoview  02/18/07   nml  . AMPUTATION Left 12/15/2017   Procedure: AMPUTATION BELOW KNEE;  Surgeon: Algernon [name], MD;  Location: [facility] ORS;  Service: Vascular;  Laterality: Left;  . ANGIOPLASTY  1/99   CAD- diogonal with rotational artherectomy  . Arthrectomy     of LAD & PTCA  . BLEPHAROPLASTY Bilateral   . CARDIAC CATHETERIZATION  1/00  . CARDIOVERSION  1/04  . CARDIOVERSION  5/07   hospital- a flutter  . CARPAL TUNNEL RELEASE     ? bilateral  . CATARACT EXTRACTION W/PHACO Right 07/28/2017   Procedure: CATARACT EXTRACTION PHACO AND INTRAOCULAR LENS PLACEMENT ([place]) RIGHT DIABETIC;  Surgeon: [name], MD;  Location: [facility];  Service: Ophthalmology;  Laterality: Right;  Diabetic - diet controlled  . CATARACT EXTRACTION W/PHACO Left 08/18/2017   Procedure: CATARACT EXTRACTION PHACO AND INTRAOCULAR LENS PLACEMENT (IOC);  Surgeon: [name], MD;  Location: [facility];  Service: Ophthalmology;  Laterality: Left;  DIABETES - oral meds  . COLONOSCOPY W/ BIOPSIES  10/01/06   sigmoid polyp bx neg, 3 years  . CORONARY ANGIOPLASTY  4/03   cutting balloon PTCA pLAD into Diag  .  CORONARY ARTERY BYPASS GRAFT  2000   LIMA-LAD, SVG-RCA, SVG-Diag; SVG-Diag & SVG-RCA occluded 2003  . FRACTURE SURGERY    . HAND SURGERY  08/27/09   R thumb procedure wit Scaphoid Gragt and screws, Dr [name]  . NM MYOVIEW LTD  4/11   normal  . PERIPHERAL VASCULAR CATHETERIZATION Right 06/27/2015   Procedure: Lower Extremity Angiography;  Surgeon: Algernon [name], MD;  Location: [facility];  Service: Cardiovascular;  Laterality: Right;  . PERIPHERAL VASCULAR CATHETERIZATION  06/27/2015   Procedure: Lower Extremity Intervention;  Surgeon: Algernon [name], MD;  Location: [facility];  Service: Cardiovascular;;      reports that he has never smoked. He quit smokeless tobacco use about 25 years ago. He reports that he does not drink alcohol or use drugs. Social History   Socioeconomic History  . Marital status:  Married    Spouse name: Not on file  . Number of children: 5  . Years of education: Not on file  . Highest education level: Not on file  Occupational History  . Occupation: [employer]    Employer: RETIRED  Social Needs  . Financial resource strain: Not on file  . Food insecurity:    Worry: Not on file    Inability: Not on file  . Transportation needs:    Medical: Not on file    Non-medical: Not on file  Tobacco Use  . Smoking status: Never Smoker  . Smokeless tobacco: Former Network engineer and Sexual Activity  . Alcohol use: No  . Drug use: No  . Sexual activity: Never  Lifestyle  . Physical activity:    Days per week: Not on file    Minutes per session: Not on file  . Stress: Not on file  Relationships  . Social connections:    Talks on phone: Not on file    Gets together: Not on file    Attends religious service: Not on file    Active member of club or organization: Not on file    Attends meetings of clubs or organizations: Not on file    Relationship status: Not on file  . Intimate partner violence:    Fear of current or ex partner: Not on  file    Emotionally abused: Not on file    Physically abused: Not on file    Forced sexual activity: Not on file  Other Topics Concern  . Not on file  Social History Narrative   Retired now does some antiques   Retired from Theatre manager and dye work   Married 1951   5 kids   Functional Status Survey:    Allergies  Allergen Reactions  . Isosorbide Mononitrate Other (See Comments)    Unknown, doesn't remember   . Ramipril Cough  . Quinidine Gluconate Rash    Pertinent  Health Maintenance Due  Topic Date Due  . FOOT EXAM  03/01/2018  . HEMOGLOBIN A1C  03/07/2018  . INFLUENZA VACCINE  03/10/2018  . OPHTHALMOLOGY EXAM  05/03/2018  . PNA vac Low Risk Adult  Completed    Medications: Outpatient Encounter Medications as of 01/20/2018  Medication Sig  . acetaminophen (TYLENOL) 325 MG tablet Take 2 tablets (650 mg total) by mouth every 6 (six) hours as needed for mild pain (or temp >/= 101 F).  [name] amLODipine (NORVASC) 2.5 MG tablet Take 1 tablet (2.5 mg total) by mouth daily.  [name] apixaban (ELIQUIS) 2.5 MG TABS tablet Take 1 tablet (2.5 mg total) by mouth 2 (two) times daily.  . B Complex Vitamins (B COMPLEX-B12) TABS Take 1 tablet by mouth daily.   Roseanne Kaufman [place]-Castor Oil (VENELEX) OINT Apply 1 application topically 3 (three) times daily. Applied at every shift day, evening, and night to bilateral buttocks/sacrum area  . Blood Glucose Monitoring Suppl (ONE TOUCH ULTRA SYSTEM KIT) W/DEVICE KIT 1 kit by Does not apply route once.  . calcium carbonate (TUMS - DOSED IN MG ELEMENTAL CALCIUM) 500 MG chewable tablet Chew 1 tablet by mouth daily as needed for indigestion or heartburn.  . cefTRIAXone (ROCEPHIN) 1 g injection Inject 1 g into the muscle daily. Form 01/15/2018-01/22/2018  . cholecalciferol (VITAMIN D) 1000 UNITS tablet Take 1,000 Units by mouth 2 (two) times a week. Takes on Mon and Fri  . Cinnamon 500 MG capsule Take 1,000 mg by mouth 2 (  two) times daily.   . fenofibrate 160 MG tablet  Take 1 tablet (160 mg total) by mouth daily.  . finasteride (PROSCAR) 5 MG tablet Take 1 tablet (5 mg total) by mouth daily.  . fish oil-omega-3 fatty acids 1000 MG capsule Take 1,000 mg by mouth daily.   [name] glucose blood test strip Use as instructed  . lisinopril (PRINIVIL,ZESTRIL) 20 MG tablet Take 1 tablet (20 mg total) by mouth daily.  [name] liver oil-zinc oxide (DESITIN) 40 % ointment Apply topically 3 (three) times daily between meals.  . metoprolol tartrate (LOPRESSOR) 25 MG tablet Take 0.5 tablets (12.5 mg total) by mouth 2 (two) times daily.  . Multiple Vitamin (DAILY MULTIVITAMIN PO) Take 1 tablet by mouth daily.    . ondansetron (ZOFRAN) 4 MG tablet Take 4 mg by mouth every 6 (six) hours as needed for nausea or vomiting.  [name] DELICA LANCETS FINE MISC check sugar once daily  . pentoxifylline (TRENTAL) 400 MG CR tablet Take 1 tablet (400 mg total) by mouth 3 (three) times daily with meals.  . Probiotic Product (RISA-BID PROBIOTIC) TABS Give 1 tablet by mouth twice a day from 01/15/2018-01/25/2018  . rosuvastatin (CRESTOR) 40 MG tablet Take 1 tablet (40 mg total) by mouth daily.  . silver sulfADIAZINE (SILVADENE) 1 % cream Apply 1 application topically daily. Place to left lateral staple incision also place to right lateral foot  . tamsulosin (FLOMAX) 0.4 MG CAPS capsule Take 1 capsule (0.4 mg total) by mouth daily.   No facility-administered encounter medications on file as of 01/20/2018.      Review of Systems   In general he is not complaining of fever chills says he continues to feel better  Head ears eyes nose mouth and throat does not complain of any sore throat or visual changes  Respiratory does not complain of increased shortness of breath or cough.  Cardiac is not complaining of any chest pain.  Or increased edema  GI-  Says his nausea continues to improve is not really complaining about today does not complain of abdominal pain diarrhea constipation  GU s he has  occasional some burning with urination--but indicates this is quite minor  Musculoskeletal is not complaining of joint pain.   Neurologic is not complaining of dizziness syncope  Is not complaining of a headache today has complained of this at times in the past  Psych he does not appear anxious or depressed --looking forward to going home  Vitals:   01/20/18 1532  BP: (!) 147/83  Pulse: 92  Resp: 20  Temp: (!) 96.9 F (36.1 C)  TempSrc: Oral  Recent blood pressures vary from systolics in the low 201E up to systolics in the 071Q I do not see consistent elevations.  Weight is 158.2 Physical Exam In general this is a pleasant elderly male in no distress sitting comfortably in his wheelchair.--Visiting with his wife and daughter    His skin is warm and dry.  Eyes sclera and conjunctive are clear visual acuity appears intact.  Oropharynx is clear mucous membranes moist.  Chest is clear to auscultation there is no labored breathing.  Heart is irregular irregular rate and rhythm without murmur gallop or rub.  Abdomen is soft nontender with active bowel sounds.  Muscle skeletal is status post below the knee amputation on the left-stump site currently is wrapped per nursing staples are in place  Moves other extremities at baseline is ambulatory in wheelchair   Also has a right  foot wound which is wrapped.--Per nursing this is a chronic callus  Neurologic is grossly intact his speech is clear no lateralizing findings.  Psych he continues to be alert and oriented pleasant and appropriate--appears to be in improving spirits and feeling significantly better than earlier in his stay   Labs reviewed: Basic Metabolic Panel: Recent Labs    12/19/17 0606  12/24/17 0529 12/25/17 0715  01/06/18 0749 01/13/18 0700 01/18/18 0230  NA 135   < > 130* 133*   < > 135 133* 131*  K 3.9   < > 4.3 4.7   < > 4.2 4.4 5.3*  CL 106   < > 101 100*   < > 102 101 100*  CO2  26   < > 23 25   < > '24 24 23  ' GLUCOSE 109*   < > 180* 115*   < > 113* 109* 84  BUN 21*   < > 21* 19   < > 32* 42* 35*  CREATININE 1.03   < > 0.88 0.79   < > 1.32* 1.66* 1.34*  CALCIUM 8.3*   < > 8.2* 8.8*   < > 9.3 9.4 9.2  MG 2.0  --  1.4* 1.5*  --   --   --   --    < > = values in this interval not displayed.   Liver Function Tests: Recent Labs    12/25/17 0715 12/26/17 2041 12/27/17 0457  AST 60* 70* 50*  ALT 35 44 35  ALKPHOS 82 98 77  BILITOT 0.6 0.6 0.6  PROT 6.3* 6.2* 5.5*  ALBUMIN 2.6* 2.6* 2.3*   Recent Labs    12/26/17 2041  LIPASE 66*   No results for input(s): AMMONIA in the last 8760 hours. CBC: Recent Labs    12/30/17 0740 01/06/18 0749 01/13/18 0700 01/18/18 0230  WBC 10.2 9.7 14.2* 10.9*  NEUTROABS 6.3 5.7  --  6.1  HGB 9.9* 11.2* 10.9* 10.5*  HCT 30.3* 35.1* 34.3* 32.2*  MCV 92.7 92.9 93.7 91.5  PLT 376 343 292 244   Cardiac Enzymes: Recent Labs    12/26/17 2041  TROPONINI <0.03   BNP: Invalid input(s): POCBNP CBG: Recent Labs    12/28/17 0726 12/28/17 1127 12/28/17 1627  GLUCAP 96 120* 141*    Procedures and Imaging Studies During Stay: Ct Abdomen Pelvis Wo Contrast  Result Date: 12/26/2017 CLINICAL DATA:  84 year old male with acute abdominal pain, nausea and vomiting today. EXAM: CT ABDOMEN AND PELVIS WITHOUT CONTRAST TECHNIQUE: Multidetector CT imaging of the abdomen and pelvis was performed following the standard protocol without IV contrast. COMPARISON:  12/22/2017 and prior CTs FINDINGS: Please note that parenchymal abnormalities may be missed without intravenous contrast. Lower chest: No acute abnormality.  Cardiomegaly identified. Hepatobiliary: Probable hepatic cysts again noted. A 1.5 cm gallstone is identified without CT evidence of acute cholecystitis. No biliary dilatation. Pancreas: Equivocal minimal stranding along the pancreatic head noted and could represent mild pancreatitis. No other pancreatic abnormalities noted.  Spleen: Unremarkable Adrenals/Urinary Tract: The kidneys and adrenal glands are unremarkable except for bilateral renal cortical atrophy. No hydronephrosis or urinary calculi. A Foley catheter is present within the bladder. Bladder diverticula again identified. Stomach/Bowel: Stomach is within normal limits. Appendix appears normal. No evidence of bowel wall thickening, distention, or inflammatory changes. Colonic diverticulosis noted without evidence of diverticulitis. Vascular/Lymphatic: Aortic atherosclerosis. No enlarged abdominal or pelvic lymph nodes. Reproductive: Prostate is unremarkable. Other: No free fluid, focal collection or pneumoperitoneum. Musculoskeletal:  No acute bony abnormality. Bilateral L5 pars defects with grade 1 anterolisthesis of L5 on S1 again noted. No suspicious bony lesions identified. IMPRESSION: 1. Equivocal minimal stranding along the pancreatic head which could represent mild pancreatitis. No acute fluid collection. 2. Cholelithiasis without CT evidence of acute cholecystitis 3. Cardiomegaly 4. Unchanged bilateral L5 pars defects with grade 1 spondylolisthesis at L5-S1 5.  Aortic Atherosclerosis (ICD10-I70.0). Electronically Signed   By: Margarette Canada M.D.   On: 12/26/2017 21:41   Ct Abdomen Pelvis Wo Contrast  Result Date: 12/22/2017 CLINICAL DATA:  Fever and elevated white count EXAM: CT ABDOMEN AND PELVIS WITHOUT CONTRAST TECHNIQUE: Multidetector CT imaging of the abdomen and pelvis was performed following the standard protocol without IV contrast. COMPARISON:  04/21/2016 FINDINGS: Lower chest: Lung bases demonstrate no acute consolidation or pleural effusion. Lower sternotomy changes. Cardiomegaly with coronary vascular calcification. Hepatobiliary: Cyst in the left hepatic lobe. Additional subcentimeter hypodensities in the right lobe too small to further characterize. Calcified stone in the gallbladder. No biliary dilatation. Pancreas: Unremarkable. No pancreatic ductal  dilatation or surrounding inflammatory changes. Spleen: Normal in size without focal abnormality. Adrenals/Urinary Tract: Adrenal glands are within normal limits. No hydronephrosis. Air in the bladder. Foley catheter in the bladder. Thick-walled appearance of the bladder with irregular margin. Stomach/Bowel: Stomach is within normal limits. Appendix appears normal. No evidence of bowel wall thickening, distention, or inflammatory changes. Sigmoid and descending colon diverticular disease without acute inflammation Vascular/Lymphatic: Moderate aortic atherosclerosis. No aneurysmal dilatation. No significantly enlarged lymph nodes Reproductive: Enlarged prostate gland Other: Negative for free air or free fluid. Musculoskeletal: Degenerative changes. No acute or suspicious abnormality. IMPRESSION: 1. Thick wall irregular appearance of the bladder, which may be secondary to cystitis or chronic obstruction. Bladder is decompressed by Foley catheter. 2. Otherwise no CT evidence for acute intra-abdominal or pelvic abnormality. 3. Gallstone 4. Diverticular disease of the colon without acute inflammation 5. Cardiomegaly Electronically Signed   By: Donavan Foil M.D.   On: 12/22/2017 22:12   Dg Chest 2 View  Result Date: 12/26/2017 CLINICAL DATA:  Nausea and vomiting EXAM: CHEST - 2 VIEW COMPARISON:  12/22/2017, 08/26/2010 FINDINGS: Post sternotomy changes. No pleural effusion. Cardiomegaly. No focal airspace disease. No pneumothorax. Mild ankylosis of the spine with mild mid to lower vertebral wedging. IMPRESSION: No active cardiopulmonary disease.  Cardiomegaly Electronically Signed   By: Donavan Foil M.D.   On: 12/26/2017 21:41   Dg Chest 2 View  Result Date: 12/22/2017 CLINICAL DATA:  Fever with weakness and nausea EXAM: CHEST - 2 VIEW COMPARISON:  08/26/2010 FINDINGS: Post sternotomy changes. Mild hyperinflation. No focal airspace disease or pleural effusion. Mild cardiomegaly. No pneumothorax. Mild degenerative  changes of the spine. IMPRESSION: No active cardiopulmonary disease.  Mild cardiomegaly. Electronically Signed   By: Donavan Foil M.D.   On: 12/22/2017 20:05    Assessment/Plan:   : #1 history of peripheral vascular disease status post BKA-he is doing well with therapy he will need continued PT and OT at home as well as nursing support.  He is followed by vascular and has follow-up scheduled with a themto monitor stump site as well as callus wound on ankle.  He is on a statin as well as Pentoxyfilline  Currently he is receiving Silvadene to the limb site.  2.  History of UTI with Pseudomonas he is completing a course of Rocephin and appears to be feeling better white count has come down update lab is pending for tomorrow.  3.  History of urinary obstruction at one point had a Foley catheter he is now on Proscar and Flomax and will have follow-up by urology apparently this has improved on this medication.  4.  History of C. difficile this appears to be resolved completed a course of vancomycin last month.  5 history of nausea and vomiting he does have PRN Zofran but apparently this is significantly improved again was thought most likely due to Cipro antibiotic he was receiving previously for UTI.   #6 atrial fibrillation He is on Eliquis chronically rate is controlled on metoprolol.  7.  History of hypertension as noted above this appears stable he is on numerous medications including metoprolol as well as lisinopril and Norvasc.  8.-  History of slight hyperkalemia again update lab is pending he is not on potassium he is on lisinopril- it appears this has resolved unremarkably previously but will await lab results.  9.  History of renal insufficiency this has improved with a creatinine of 1.34 on lab done yesterday it was 1.66- update lab is pending.  10 history of hyponatremia appears relatively stabilized in the low 130s again will await updated lab.  #11 history of diabetes--this  is diet controlled and stable with CBGs largely in the 90s to lower mid 100s  Again he will be going home with his wife he has a very supportive family he will need a wheelchair status post amputation and will need a sling arm on the wheelchair- he also will need continued physical and occupational therapy as well as nursing to help with his multiple medical issues  He also well follow-up with primary care provider as well as the vein and vascular specialist.  [phone] note greater than 30 minutes spent on this discharge summary- greater than 50% of time spent coordinating a plan of care for numerous diagnoses     .

## 2018-01-21 ENCOUNTER — Encounter (HOSPITAL_COMMUNITY)
Admission: RE | Admit: 2018-01-21 | Discharge: 2018-01-21 | Disposition: A | Discharge status: Home / Self Care | Payer: PPO | Source: Skilled Nursing Facility | Attending: Internal Medicine | Admitting: Internal Medicine

## 2018-01-21 DIAGNOSIS — I10 Essential (primary) hypertension: Secondary | ICD-10-CM | POA: Insufficient documentation

## 2018-01-21 DIAGNOSIS — S88112D Complete traumatic amputation at level between knee and ankle, left lower leg, subsequent encounter: Secondary | ICD-10-CM | POA: Insufficient documentation

## 2018-01-21 DIAGNOSIS — Z4781 Encounter for orthopedic aftercare following surgical amputation: Secondary | ICD-10-CM | POA: Insufficient documentation

## 2018-01-21 DIAGNOSIS — D631 Anemia in chronic kidney disease: Secondary | ICD-10-CM | POA: Insufficient documentation

## 2018-01-21 LAB — CBC WITH DIFFERENTIAL/PLATELET
BASOS PCT: 1 %
Basophils Absolute: 0.1 10*3/uL (ref 0.0–0.1)
EOS ABS: 0.5 10*3/uL (ref 0.0–0.7)
EOS PCT: 4 %
HCT: 37.6 % — ABNORMAL LOW (ref 39.0–52.0)
HEMOGLOBIN: 12 g/dL — AB (ref 13.0–17.0)
Lymphocytes Relative: 27 %
Lymphs Abs: 3.1 10*3/uL (ref 0.7–4.0)
MCH: 29.9 pg (ref 26.0–34.0)
MCHC: 31.9 g/dL (ref 30.0–36.0)
MCV: 93.5 fL (ref 78.0–100.0)
MONOS PCT: 6 %
Monocytes Absolute: 0.7 10*3/uL (ref 0.1–1.0)
NEUTROS PCT: 62 %
Neutro Abs: 7 10*3/uL (ref 1.7–7.7)
PLATELETS: 236 10*3/uL (ref 150–400)
RBC: 4.02 MIL/uL — ABNORMAL LOW (ref 4.22–5.81)
RDW: 15.3 % (ref 11.5–15.5)
WBC: 11.3 10*3/uL — ABNORMAL HIGH (ref 4.0–10.5)

## 2018-01-21 LAB — BASIC METABOLIC PANEL
Anion gap: 9 (ref 5–15)
BUN: 31 mg/dL — AB (ref 6–20)
CALCIUM: 9.4 mg/dL (ref 8.9–10.3)
CO2: 24 mmol/L (ref 22–32)
CREATININE: 1.22 mg/dL (ref 0.61–1.24)
Chloride: 104 mmol/L (ref 101–111)
GFR calc non Af Amer: 51 mL/min — ABNORMAL LOW (ref >60)
GFR, EST AFRICAN AMERICAN: 60 mL/min — AB (ref >60)
Glucose, Bld: 107 mg/dL — ABNORMAL HIGH (ref 65–99)
Potassium: 4.5 mmol/L (ref 3.5–5.1)
SODIUM: 137 mmol/L (ref 135–145)

## 2018-01-24 ENCOUNTER — Other Ambulatory Visit: Payer: Self-pay | Admitting: *Deleted

## 2018-01-24 DIAGNOSIS — S88112D Complete traumatic amputation at level between knee and ankle, left lower leg, subsequent encounter: Secondary | ICD-10-CM | POA: Diagnosis not present

## 2018-01-24 NOTE — Patient Outreach (Signed)
[facility]) Care Management  01/24/2018  Eric [name]. 1927/10/30 [id number]   CSW had received notification from [place] that patient had discharged home from [facility] this past Saturday, 01/22/18. CSW called & confirmed with patient and his wife, [name] that he has been doing well at home and is looking forward to having [facility] NP, Deloria Lair follow-up for Transition of Care. CSW confirmed that his home address is: [address], [place] [address] & home phone number: [phone]. CSW sent inbasket message to [facility] BSW to assist with transportation needs.    Raynaldo Opitz, LCSW [facility]  Clinical Social Worker cell #: [phone]

## 2018-01-25 ENCOUNTER — Ambulatory Visit: Payer: PPO | Admitting: *Deleted

## 2018-01-25 ENCOUNTER — Other Ambulatory Visit: Payer: Self-pay | Admitting: *Deleted

## 2018-01-25 ENCOUNTER — Other Ambulatory Visit: Payer: Self-pay

## 2018-01-25 NOTE — Patient Outreach (Signed)
Initial transition of care call completed. Pt is doing very well. He has not heard from [facility] about his home health orders. He has all his medications and will see his primary care MD tomorrow and vascular surgeon next week.  Patient was recently discharged from hospital and all medications have been reviewed.  Outpatient Encounter Medications as of 01/25/2018  Medication Sig Note  . acetaminophen (TYLENOL) 325 MG tablet Take 2 tablets (650 mg total) by mouth every 6 (six) hours as needed for mild pain (or temp >/= 101 F).   [name] amLODipine (NORVASC) 2.5 MG tablet Take 1 tablet (2.5 mg total) by mouth daily.   [name] apixaban (ELIQUIS) 2.5 MG TABS tablet Take 1 tablet (2.5 mg total) by mouth 2 (two) times daily.   . B Complex Vitamins (B COMPLEX-B12) TABS Take 1 tablet by mouth daily.    Roseanne Kaufman Peru-Castor Oil (VENELEX) OINT Apply 1 application topically 3 (three) times daily. Applied at every shift day, evening, and night to bilateral buttocks/sacrum area   . Blood Glucose Monitoring Suppl (ONE TOUCH ULTRA SYSTEM KIT) W/DEVICE KIT 1 kit by Does not apply route once.   . calcium carbonate (TUMS - DOSED IN MG ELEMENTAL CALCIUM) 500 MG chewable tablet Chew 1 tablet by mouth daily as needed for indigestion or heartburn.   . cholecalciferol (VITAMIN D) 1000 UNITS tablet Take 1,000 Units by mouth 2 (two) times a week. Takes on Mon and Fri   . Cinnamon 500 MG capsule Take 1,000 mg by mouth 2 (two) times daily.    . fenofibrate 160 MG tablet Take 1 tablet (160 mg total) by mouth daily.   . fish oil-omega-3 fatty acids 1000 MG capsule Take 1,000 mg by mouth daily.    [name] glucose blood test strip Use as instructed   . lisinopril (PRINIVIL,ZESTRIL) 20 MG tablet Take 1 tablet (20 mg total) by mouth daily.   [name] liver oil-zinc oxide (DESITIN) 40 % ointment Apply topically 3 (three) times daily between meals.   . metoprolol tartrate (LOPRESSOR) 25 MG tablet Take 0.5 tablets (12.5 mg total) by mouth 2 (two)  times daily.   . Multiple Vitamin (DAILY MULTIVITAMIN PO) Take 1 tablet by mouth daily.     . ondansetron (ZOFRAN) 4 MG tablet Take 4 mg by mouth every 6 (six) hours as needed for nausea or vomiting.   [name] DELICA LANCETS FINE MISC check sugar once daily   . rosuvastatin (CRESTOR) 40 MG tablet Take 1 tablet (40 mg total) by mouth daily.   . silver sulfADIAZINE (SILVADENE) 1 % cream Apply 1 application topically daily. Place to left lateral staple incision also place to right lateral foot   . tamsulosin (FLOMAX) 0.4 MG CAPS capsule Take 1 capsule (0.4 mg total) by mouth daily.   . finasteride (PROSCAR) 5 MG tablet Take 1 tablet (5 mg total) by mouth daily. (Patient not taking: Reported on 01/25/2018)   . pentoxifylline (TRENTAL) 400 MG CR tablet Take 1 tablet (400 mg total) by mouth 3 (three) times daily with meals. (Patient not taking: Reported on 01/25/2018) 01/25/2018: Pt actually does have this and does take it tid.  . [DISCONTINUED] cefTRIAXone (ROCEPHIN) 1 g injection Inject 1 g into the muscle daily. Form 01/15/2018-01/22/2018   . [DISCONTINUED] Probiotic Product (RISA-BID PROBIOTIC) TABS Give 1 tablet by mouth twice a day from 01/15/2018-01/25/2018    No facility-administered encounter medications on file as of 01/25/2018.    Depression screen [facility] 2/9 01/25/2018 07/07/2017  06/30/2017 05/03/2017 05/03/2017  Decreased Interest 1 0 0 0 0  Down, Depressed, Hopeless 0 0 0 0 0  PHQ - 2 Score 1 0 0 0 0  Some recent data might be hidden   Fall Risk  01/25/2018 07/07/2017 06/30/2017 05/03/2017 05/03/2017  Falls in the past year? Yes No No No Yes  Number falls in past yr: 2 or more - - - 1  Injury with Fall? No - - - No  Risk Factor Category  High Fall Risk - - - -  Risk for fall due to : History of fall(s);Impaired balance/gait;Impaired mobility - - - -  Risk for fall due to: Comment - - - - -  Follow up Falls evaluation completed;Falls prevention discussed - - Falls evaluation completed;Falls  prevention discussed Falls evaluation completed   THN CM Care Plan Problem One     Most Recent Value  Care Plan Problem One  New RBKA  Role Documenting the Problem One  Care Management [place] for Problem One  Active  THN Long Term Goal   Pt will progress per his report in his adaptation to being an amputee over next 31 days.  THN Long Term Goal Start Date  01/25/18  Interventions for Problem One Long Term Goal  Verified has a wheelchair and mobiity status, verified that home health has not started services and call to investigate.  THN CM Short Term Goal #1   Pt will work with PT/OT for optimal and safe mobility over the next 30 days.  THN CM Short Term Goal #1 Start Date  01/25/18  Interventions for Short Term Goal #1  Investigated when start up with home health will begin, waiting call back.  THN CM Short Term Goal #2   Pt will attend his MD appointments over the next week.  THN CM Short Term Goal #2 Start Date  01/25/18  Interventions for Short Term Goal #2  Verified appointments and transportation.     I will see pt next week myself.  Eulah Pont. Myrtie Neither, MSN, [facility] Gerontological Nurse Practitioner [facility] Care Management [phone]

## 2018-01-25 NOTE — Patient Outreach (Signed)
[facility]) Care Management  01/25/2018  Eric [name]. 06/06/1928 [id number]   BSW received referral from LCSW, Raynaldo Opitz, to assist patient with transportation needs and applying for [employer].  At the patient's request, BSW spoke with his spouse, Eric [name], regarding this transportation request.  BSW contacted [employer] prior to reaching out to patient to ensure that they are able to transport from patient's home.  Per Loma Sousa at [facility], patient lives outside of the area that [place] can transport.  BSW informed Mrs. Remo of this.   BSW informed Mrs. Zellars of [facility] which can often assist patients that are outside of the [place] area.  BSW offered to complete and submit application for this service.  Mrs. Sanluis spoke with patient and they declined applying.  Mrs. Mota reported that they have two sons that can assist with transportation.   BSW is signing off at this time but encouraged patient to call if any other social work needs arise.    Ronn Melena, BSW Social Worker [phone]

## 2018-01-26 ENCOUNTER — Encounter: Payer: Self-pay | Admitting: Family Medicine

## 2018-01-26 ENCOUNTER — Ambulatory Visit (INDEPENDENT_AMBULATORY_CARE_PROVIDER_SITE_OTHER): Payer: PPO | Admitting: Family Medicine

## 2018-01-26 VITALS — BP 100/68 | HR 56 | Temp 97.9°F | Resp 14 | Ht 65.0 in

## 2018-01-26 DIAGNOSIS — Z09 Encounter for follow-up examination after completed treatment for conditions other than malignant neoplasm: Secondary | ICD-10-CM | POA: Diagnosis not present

## 2018-01-26 DIAGNOSIS — S88112D Complete traumatic amputation at level between knee and ankle, left lower leg, subsequent encounter: Secondary | ICD-10-CM | POA: Diagnosis not present

## 2018-01-26 NOTE — Progress Notes (Signed)
Subjective:    Patient ID: Eric [name]., male    DOB: [birth year], 84 y.o.   MRN: [id number]  HPI  Patient is here today for hospital discharge follow-up.  When I last saw the patient, he had a necrotic nonhealing ulcer on the left first MTP joint extending over into the left great toe and onto the dorsum of the left foot with dry gangrene.  He been treated with numerous rounds of antibiotics and that also subsequently developed C. difficile colitis.  He was admitted to the hospital and underwent a below the knee amputation of the left leg under the care of [name].  His C. difficile colitis was treated in the hospital with oral vancomycin.  Patient was discharged from the hospital to a skilled nursing facility for rehab.  He was readmitted to the hospital on 2 separate occasions once with dehydration and Pseudomonas UTI and the second time for dehydration and acute kidney injury.  He is here today for follow-up.  I examined the patient's stump.  Staples are still in place.  The operative site is intact.  There is black scab in the lateral aspect of the operative site but there is no wound dehiscence and there is no evidence of cellulitis.  This appears to be a thick dry scab.  He is seeing his vascular surgeon again on Tuesday for another wound check.  He has not been fitted yet for a prosthetic device he is still confined to a wheelchair but he is back home.  He denies any diarrhea.  He has 3-4 bowel movements a day.  They are soft but he denies any watery, melanotic, or foul-smelling stool.  He denies any dysuria fevers chills or urgency. Past Medical History:  Diagnosis Date  . Atrial flutter ([place])    s/p RFCA  . CAD (coronary artery disease)    cath 2003, occluded S-RCA, occluded S-Dx, L-LAD ok, s/p PTCA to LAD  . Cataract   . CKD (chronic kidney disease) stage 3, GFR 30-59 ml/min (HCC)   . Diabetes mellitus    diet controlled  . Hearing aid worn    bilateral  . History of kidney  stones   . Long term (current) use of anticoagulants   . Neuromuscular disorder ([place])   . OSA (obstructive sleep apnea) 12/11   very mild, AHI 7/hr  . Persistent atrial fibrillation ([place]) 09/26/2015  . Pure hyperglyceridemia   . PVD (peripheral vascular disease) ([place])    angioplasty of his right lower extremity in [place] by Dr.[name] 2013  . Unspecified essential hypertension   . Wears dentures    full upper and lower   Past Surgical History:  Procedure Laterality Date  . ABDOMINAL AORTOGRAM W/LOWER EXTREMITY N/A 11/15/2017   Procedure: ABDOMINAL AORTOGRAM W/LOWER EXTREMITY;  Surgeon: [name], MD;  Location: [facility];  Service: Cardiovascular;  Laterality: N/A;  . Adenosine Myoview  3/06   EF 56%, neg. Ischemia  . Adenosine Myoview  02/18/07   nml  . AMPUTATION Left 12/15/2017   Procedure: AMPUTATION BELOW KNEE;  Surgeon: Algernon [name], MD;  Location: [facility] OR[facility];  Service: Vascular;  Laterality: Left;  . ANGIOPLASTY  1/99   CAD- diogonal with rotational artherectomy  . Arthrectomy     of LAD & PTCA  . BLEPHAROPLASTY Bilateral   . CARDIAC CATHETERIZATION  1/00  . CARDIOVERSION  1/04  . CARDIOVERSION  5/07   hospital- a flutter  . CARPAL TUNNEL RELEASE     ?  bilateral  . CATARACT EXTRACTION W/PHACO Right 07/28/2017   Procedure: CATARACT EXTRACTION PHACO AND INTRAOCULAR LENS PLACEMENT ([place]) RIGHT DIABETIC;  Surgeon: [name], MD;  Location: [facility];  Service: Ophthalmology;  Laterality: Right;  Diabetic - diet controlled  . CATARACT EXTRACTION W/PHACO Left 08/18/2017   Procedure: CATARACT EXTRACTION PHACO AND INTRAOCULAR LENS PLACEMENT (IOC);  Surgeon: [name], MD;  Location: [facility];  Service: Ophthalmology;  Laterality: Left;  DIABETES - oral meds  . COLONOSCOPY W/ BIOPSIES  10/01/06   sigmoid polyp bx neg, 3 years  . CORONARY ANGIOPLASTY  4/03   cutting balloon PTCA pLAD into Diag  . CORONARY ARTERY BYPASS GRAFT  2000    LIMA-LAD, SVG-RCA, SVG-Diag; SVG-Diag & SVG-RCA occluded 2003  . FRACTURE SURGERY    . HAND SURGERY  08/27/09   R thumb procedure wit Scaphoid Gragt and screws, Dr [name]  . NM MYOVIEW LTD  4/11   normal  . PERIPHERAL VASCULAR CATHETERIZATION Right 06/27/2015   Procedure: Lower Extremity Angiography;  Surgeon: Algernon [name], MD;  Location: [facility];  Service: Cardiovascular;  Laterality: Right;  . PERIPHERAL VASCULAR CATHETERIZATION  06/27/2015   Procedure: Lower Extremity Intervention;  Surgeon: Algernon [name], MD;  Location: [facility];  Service: Cardiovascular;;   Current Outpatient Medications on File Prior to Visit  Medication Sig Dispense Refill  . acetaminophen (TYLENOL) 325 MG tablet Take 2 tablets (650 mg total) by mouth every 6 (six) hours as needed for mild pain (or temp >/= 101 F).    [name] amLODipine (NORVASC) 2.5 MG tablet Take 1 tablet (2.5 mg total) by mouth daily. 30 tablet 0  . apixaban (ELIQUIS) 2.5 MG TABS tablet Take 1 tablet (2.5 mg total) by mouth 2 (two) times daily. 180 tablet 1  . B Complex Vitamins (B COMPLEX-B12) TABS Take 1 tablet by mouth daily.     Roseanne Kaufman Peru-Castor Oil (VENELEX) OINT Apply 1 application topically 3 (three) times daily. Applied at every shift day, evening, and night to bilateral buttocks/sacrum area    . Blood Glucose Monitoring Suppl (ONE TOUCH ULTRA SYSTEM KIT) W/DEVICE KIT 1 kit by Does not apply route once. 1 each 0  . calcium carbonate (TUMS - DOSED IN MG ELEMENTAL CALCIUM) 500 MG chewable tablet Chew 1 tablet by mouth daily as needed for indigestion or heartburn.    . cholecalciferol (VITAMIN D) 1000 UNITS tablet Take 1,000 Units by mouth 2 (two) times a week. Takes on Mon and Fri    . Cinnamon 500 MG capsule Take 1,000 mg by mouth 2 (two) times daily.     . fenofibrate 160 MG tablet Take 1 tablet (160 mg total) by mouth daily. 90 tablet 2  . fish oil-omega-3 fatty acids 1000 MG capsule Take 1,000 mg by mouth daily.     [name]  glucose blood test strip Use as instructed 100 each 3  . lisinopril (PRINIVIL,ZESTRIL) 20 MG tablet Take 1 tablet (20 mg total) by mouth daily. 30 tablet 0  . liver oil-zinc oxide (DESITIN) 40 % ointment Apply topically 3 (three) times daily between meals. 56.7 g 0  . metoprolol tartrate (LOPRESSOR) 25 MG tablet Take 0.5 tablets (12.5 mg total) by mouth 2 (two) times daily. 30 tablet 0  . Multiple Vitamin (DAILY MULTIVITAMIN PO) Take 1 tablet by mouth daily.      . ondansetron (ZOFRAN) 4 MG tablet Take 4 mg by mouth every 6 (six) hours as needed for nausea  or vomiting.    [name] DELICA LANCETS FINE MISC check sugar once daily 100 each 2  . pentoxifylline (TRENTAL) 400 MG CR tablet Take 1 tablet (400 mg total) by mouth 3 (three) times daily with meals. 90 tablet 3  . rosuvastatin (CRESTOR) 40 MG tablet Take 1 tablet (40 mg total) by mouth daily. 90 tablet 2  . silver sulfADIAZINE (SILVADENE) 1 % cream Apply 1 application topically daily. Place to left lateral staple incision also place to right lateral foot    . tamsulosin (FLOMAX) 0.4 MG CAPS capsule Take 1 capsule (0.4 mg total) by mouth daily. 30 capsule 0  . finasteride (PROSCAR) 5 MG tablet Take 1 tablet (5 mg total) by mouth daily. (Patient not taking: Reported on 01/26/2018) 30 tablet 0   No current facility-administered medications on file prior to visit.    Allergies  Allergen Reactions  . Isosorbide Mononitrate Other (See Comments)    Unknown, doesn't remember   . Ramipril Cough  . Quinidine Gluconate Rash   Social History   Socioeconomic History  . Marital status: Married    Spouse name: Not on file  . Number of children: 5  . Years of education: Not on file  . Highest education level: Not on file  Occupational History  . Occupation: [employer]    Employer: RETIRED  Social Needs  . Financial resource strain: Not on file  . Food insecurity:    Worry: Not on file    Inability: Not on file  . Transportation  needs:    Medical: Not on file    Non-medical: Not on file  Tobacco Use  . Smoking status: Never Smoker  . Smokeless tobacco: Former Network engineer and Sexual Activity  . Alcohol use: No  . Drug use: No  . Sexual activity: Never  Lifestyle  . Physical activity:    Days per week: Not on file    Minutes per session: Not on file  . Stress: Not on file  Relationships  . Social connections:    Talks on phone: Not on file    Gets together: Not on file    Attends religious service: Not on file    Active member of club or organization: Not on file    Attends meetings of clubs or organizations: Not on file    Relationship status: Not on file  . Intimate partner violence:    Fear of current or ex partner: Not on file    Emotionally abused: Not on file    Physically abused: Not on file    Forced sexual activity: Not on file  Other Topics Concern  . Not on file  Social History Narrative   Retired now does some antiques   Retired from Theatre manager and dye work   Married 1951   5 kids     Review of Systems  All other systems reviewed and are negative.      Objective:   Physical Exam  Constitutional: He appears well-developed and well-nourished. No distress.  Eyes: Conjunctivae are normal. Right eye exhibits no discharge. Left eye exhibits no discharge. No scleral icterus.  Neck: Neck supple. No JVD present.  Cardiovascular: Normal rate and normal heart sounds. An irregularly irregular rhythm present.  Pulmonary/Chest: Effort normal and breath sounds normal. No stridor. No respiratory distress. He has no wheezes. He has no rales.  Abdominal: Soft. Bowel sounds are normal. He exhibits no distension. There is no tenderness. There is no guarding.  Musculoskeletal: He exhibits no edema.  Lymphadenopathy:    He has no cervical adenopathy.  Skin: He is not diaphoretic.  Vitals reviewed.   Please see the history of present illness.  I examined the patient's wound stump on his left below  the knee amputation.  Staples are still in place and have not yet been removed due to concerns over dehiscence.  There is no secondary cellulitis although there is a thick black scab over the lateral aspect of the wound.  There is no drainage.  There is no foul odor.  It appears to be vascularly intact      Assessment & Plan:  Hospital discharge follow-up - Plan: CBC with Differential/Platelet, COMPLETE METABOLIC PANEL WITH GFR, Urine Culture  Follow-up with [name] as planned.  Once staples have been removed and the patient has been cleared, I would like to have him fitted for a prosthetic device.  We will arrange this if not already done so by his surgeon once he has been surgically cleared.  In the meantime patient is nonweightbearing on his left leg and is confined to a wheelchair.  I would like to repeat a urine sample to ensure complete resolution of his Pseudomonas UTI.  I would obtain a CBC to monitor for anemia postoperatively.  I will also check a CMP to evaluate for any electrolyte disturbances or evidence of dehydration.  Patient is still taking a probiotic which given his recent severe C. difficile colitis I believe is a good idea.  The wound was dressed today with Silvadene over the operative site, covered with nonadherent gauze, and then wrapping Coban.

## 2018-01-27 ENCOUNTER — Other Ambulatory Visit: Payer: Self-pay | Admitting: *Deleted

## 2018-01-27 NOTE — Patient Outreach (Addendum)
Called pt to ensure they have received a call from [facility] or had a home health visit. They have still not heard from anyone. I assured I would continue to investigate and get the services started he was to have (nursing, PT/OT).  I have called [facility] again and left a message for Tammy, LCSW, that the pt hasnt' received services and this is not satisfactory, the pt was discharged on Saturday and now it is Thursday. I asked that she return my call.  Eric Pont. Myrtie Neither, MSN, GNP-BC Gerontological Nurse Practitioner [facility] Management [phone]  Subsequently, I have talked with [name] LCSW at [facility] and [place] home care representative at [facility]. They have the verification form that the orders were sent but there is a question as to if the second page of orders was perhaps overlooked. They have refaxed the orders asking for service to begin 01/28/18. I will call pt to ensure that it has tomoroow.  Eric Pont. Myrtie Neither, MSN, [facility] Gerontological Nurse Practitioner [facility] Care Management [phone]

## 2018-01-28 ENCOUNTER — Encounter: Payer: Self-pay | Admitting: Family Medicine

## 2018-01-28 DIAGNOSIS — E1122 Type 2 diabetes mellitus with diabetic chronic kidney disease: Secondary | ICD-10-CM | POA: Diagnosis not present

## 2018-01-28 DIAGNOSIS — I2510 Atherosclerotic heart disease of native coronary artery without angina pectoris: Secondary | ICD-10-CM | POA: Diagnosis not present

## 2018-01-28 DIAGNOSIS — G4733 Obstructive sleep apnea (adult) (pediatric): Secondary | ICD-10-CM | POA: Diagnosis not present

## 2018-01-28 DIAGNOSIS — Z4781 Encounter for orthopedic aftercare following surgical amputation: Secondary | ICD-10-CM | POA: Diagnosis not present

## 2018-01-28 DIAGNOSIS — I481 Persistent atrial fibrillation: Secondary | ICD-10-CM | POA: Diagnosis not present

## 2018-01-28 DIAGNOSIS — I129 Hypertensive chronic kidney disease with stage 1 through stage 4 chronic kidney disease, or unspecified chronic kidney disease: Secondary | ICD-10-CM | POA: Diagnosis not present

## 2018-01-28 DIAGNOSIS — E1151 Type 2 diabetes mellitus with diabetic peripheral angiopathy without gangrene: Secondary | ICD-10-CM | POA: Diagnosis not present

## 2018-01-28 DIAGNOSIS — I4892 Unspecified atrial flutter: Secondary | ICD-10-CM | POA: Diagnosis not present

## 2018-01-28 DIAGNOSIS — N183 Chronic kidney disease, stage 3 (moderate): Secondary | ICD-10-CM | POA: Diagnosis not present

## 2018-01-28 DIAGNOSIS — Z7901 Long term (current) use of anticoagulants: Secondary | ICD-10-CM | POA: Diagnosis not present

## 2018-01-28 DIAGNOSIS — Z89512 Acquired absence of left leg below knee: Secondary | ICD-10-CM | POA: Diagnosis not present

## 2018-01-28 LAB — URINE CULTURE
MICRO NUMBER: 90734234
SPECIMEN QUALITY: ADEQUATE

## 2018-01-28 MED ORDER — SACCHAROMYCES BOULARDII 250 MG PO CAPS: 250.0000 mg | ORAL_CAPSULE | Freq: Every day | ORAL | 3 refills | Status: DC

## 2018-01-31 DIAGNOSIS — E1122 Type 2 diabetes mellitus with diabetic chronic kidney disease: Secondary | ICD-10-CM | POA: Diagnosis not present

## 2018-01-31 DIAGNOSIS — I129 Hypertensive chronic kidney disease with stage 1 through stage 4 chronic kidney disease, or unspecified chronic kidney disease: Secondary | ICD-10-CM | POA: Diagnosis not present

## 2018-01-31 DIAGNOSIS — Z89512 Acquired absence of left leg below knee: Secondary | ICD-10-CM | POA: Diagnosis not present

## 2018-01-31 DIAGNOSIS — Z4781 Encounter for orthopedic aftercare following surgical amputation: Secondary | ICD-10-CM | POA: Diagnosis not present

## 2018-01-31 DIAGNOSIS — Z7901 Long term (current) use of anticoagulants: Secondary | ICD-10-CM | POA: Diagnosis not present

## 2018-02-01 ENCOUNTER — Encounter (INDEPENDENT_AMBULATORY_CARE_PROVIDER_SITE_OTHER): Payer: Self-pay | Admitting: Vascular Surgery

## 2018-02-01 ENCOUNTER — Ambulatory Visit (INDEPENDENT_AMBULATORY_CARE_PROVIDER_SITE_OTHER): Payer: PPO | Admitting: Vascular Surgery

## 2018-02-01 VITALS — BP 96/57 | HR 63 | Resp 16 | Ht 65.0 in

## 2018-02-01 DIAGNOSIS — Z89512 Acquired absence of left leg below knee: Secondary | ICD-10-CM

## 2018-02-01 DIAGNOSIS — E11621 Type 2 diabetes mellitus with foot ulcer: Secondary | ICD-10-CM | POA: Diagnosis not present

## 2018-02-01 DIAGNOSIS — S88112D Complete traumatic amputation at level between knee and ankle, left lower leg, subsequent encounter: Secondary | ICD-10-CM | POA: Diagnosis not present

## 2018-02-01 NOTE — Assessment & Plan Note (Signed)
Wound is healing well.  Half of the staples were removed today.  Can return in about 2 weeks to remove the remainder of the staples.  At that point, should begin prosthesis evaluation.

## 2018-02-01 NOTE — Progress Notes (Signed)
Patient ID: Eric Schifano., male   DOB: Jan 30, 1928, 84 y.o.   MRN: [id number]  Chief Complaint  Patient presents with  . Follow-up    2wk wound check    HPI Eric [name]. is a 84 y.o. male.  Patient returns about 6 weeks after left BKA.  He is doing well.  He has full range of motion in his knee.  His wound is doing well.   Past Medical History:  Diagnosis Date  . Atrial flutter ([place])    s/p RFCA  . CAD (coronary artery disease)    cath 2003, occluded S-RCA, occluded S-Dx, L-LAD ok, s/p PTCA to LAD  . Cataract   . CKD (chronic kidney disease) stage 3, GFR 30-59 ml/min (HCC)   . Diabetes mellitus    diet controlled  . Hearing aid worn    bilateral  . History of kidney stones   . Long term (current) use of anticoagulants   . Neuromuscular disorder ([place])   . OSA (obstructive sleep apnea) 12/11   very mild, AHI 7/hr  . Persistent atrial fibrillation ([place]) 09/26/2015  . Pure hyperglyceridemia   . PVD (peripheral vascular disease) ([place])    angioplasty of his right lower extremity in [place] by Dr.[name] 2013  . Unspecified essential hypertension   . Wears dentures    full upper and lower    Past Surgical History:  Procedure Laterality Date  . ABDOMINAL AORTOGRAM W/LOWER EXTREMITY N/A 11/15/2017   Procedure: ABDOMINAL AORTOGRAM W/LOWER EXTREMITY;  Surgeon: [name], MD;  Location: [facility];  Service: Cardiovascular;  Laterality: N/A;  . Adenosine Myoview  3/06   EF 56%, neg. Ischemia  . Adenosine Myoview  02/18/07   nml  . AMPUTATION Left 12/15/2017   Procedure: AMPUTATION BELOW KNEE;  Surgeon: Algernon [name], MD;  Location: [facility];  Service: Vascular;  Laterality: Left;  . ANGIOPLASTY  1/99   CAD- diogonal with rotational artherectomy  . Arthrectomy     of LAD & PTCA  . BLEPHAROPLASTY Bilateral   . CARDIAC CATHETERIZATION  1/00  . CARDIOVERSION  1/04  . CARDIOVERSION  5/07   hospital- a flutter  . CARPAL TUNNEL RELEASE     ? bilateral    . CATARACT EXTRACTION W/PHACO Right 07/28/2017   Procedure: CATARACT EXTRACTION PHACO AND INTRAOCULAR LENS PLACEMENT ([place]) RIGHT DIABETIC;  Surgeon: [name], MD;  Location: [facility];  Service: Ophthalmology;  Laterality: Right;  Diabetic - diet controlled  . CATARACT EXTRACTION W/PHACO Left 08/18/2017   Procedure: CATARACT EXTRACTION PHACO AND INTRAOCULAR LENS PLACEMENT (IOC);  Surgeon: [name], MD;  Location: [facility];  Service: Ophthalmology;  Laterality: Left;  DIABETES - oral meds  . COLONOSCOPY W/ BIOPSIES  10/01/06   sigmoid polyp bx neg, 3 years  . CORONARY ANGIOPLASTY  4/03   cutting balloon PTCA pLAD into Diag  . CORONARY ARTERY BYPASS GRAFT  2000   LIMA-LAD, SVG-RCA, SVG-Diag; SVG-Diag & SVG-RCA occluded 2003  . FRACTURE SURGERY    . HAND SURGERY  08/27/09   R thumb procedure wit Scaphoid Gragt and screws, Dr [name]  . NM MYOVIEW LTD  4/11   normal  . PERIPHERAL VASCULAR CATHETERIZATION Right 06/27/2015   Procedure: Lower Extremity Angiography;  Surgeon: Algernon [name], MD;  Location: [facility];  Service: Cardiovascular;  Laterality: Right;  . PERIPHERAL VASCULAR CATHETERIZATION  06/27/2015   Procedure: Lower Extremity Intervention;  Surgeon: Algernon [name],  MD;  Location: [facility];  Service: Cardiovascular;;      Allergies  Allergen Reactions  . Isosorbide Mononitrate Other (See Comments)    Unknown, doesn't remember   . Ramipril Cough  . Quinidine Gluconate Rash    Current Outpatient Medications  Medication Sig Dispense Refill  . acetaminophen (TYLENOL) 325 MG tablet Take 2 tablets (650 mg total) by mouth every 6 (six) hours as needed for mild pain (or temp >/= 101 F).    [name] amLODipine (NORVASC) 2.5 MG tablet Take 1 tablet (2.5 mg total) by mouth daily. 30 tablet 0  . apixaban (ELIQUIS) 2.5 MG TABS tablet Take 1 tablet (2.5 mg total) by mouth 2 (two) times daily. 180 tablet 1  . B Complex Vitamins (B  COMPLEX-B12) TABS Take 1 tablet by mouth daily.     Roseanne Kaufman Peru-Castor Oil (VENELEX) OINT Apply 1 application topically 3 (three) times daily. Applied at every shift day, evening, and night to bilateral buttocks/sacrum area    . Blood Glucose Monitoring Suppl (ONE TOUCH ULTRA SYSTEM KIT) W/DEVICE KIT 1 kit by Does not apply route once. 1 each 0  . calcium carbonate (TUMS - DOSED IN MG ELEMENTAL CALCIUM) 500 MG chewable tablet Chew 1 tablet by mouth daily as needed for indigestion or heartburn.    . cholecalciferol (VITAMIN D) 1000 UNITS tablet Take 1,000 Units by mouth 2 (two) times a week. Takes on Mon and Fri    . Cinnamon 500 MG capsule Take 1,000 mg by mouth 2 (two) times daily.     . fenofibrate 160 MG tablet Take 1 tablet (160 mg total) by mouth daily. 90 tablet 2  . fish oil-omega-3 fatty acids 1000 MG capsule Take 1,000 mg by mouth daily.     [name] glucose blood test strip Use as instructed 100 each 3  . lisinopril (PRINIVIL,ZESTRIL) 20 MG tablet Take 1 tablet (20 mg total) by mouth daily. 30 tablet 0  . liver oil-zinc oxide (DESITIN) 40 % ointment Apply topically 3 (three) times daily between meals. 56.7 g 0  . metoprolol tartrate (LOPRESSOR) 25 MG tablet Take 0.5 tablets (12.5 mg total) by mouth 2 (two) times daily. 30 tablet 0  . Multiple Vitamin (DAILY MULTIVITAMIN PO) Take 1 tablet by mouth daily.      . ondansetron (ZOFRAN) 4 MG tablet Take 4 mg by mouth every 6 (six) hours as needed for nausea or vomiting.    [name] DELICA LANCETS FINE MISC check sugar once daily 100 each 2  . pentoxifylline (TRENTAL) 400 MG CR tablet Take 1 tablet (400 mg total) by mouth 3 (three) times daily with meals. 90 tablet 3  . rosuvastatin (CRESTOR) 40 MG tablet Take 1 tablet (40 mg total) by mouth daily. 90 tablet 2  . saccharomyces boulardii (FLORASTOR) 250 MG capsule Take 1 capsule (250 mg total) by mouth daily. 90 capsule 3  . silver sulfADIAZINE (SILVADENE) 1 % cream Apply 1 application topically  daily. Place to left lateral staple incision also place to right lateral foot    . tamsulosin (FLOMAX) 0.4 MG CAPS capsule Take 1 capsule (0.4 mg total) by mouth daily. 30 capsule 0  . finasteride (PROSCAR) 5 MG tablet Take 1 tablet (5 mg total) by mouth daily. (Patient not taking: Reported on 01/26/2018) 30 tablet 0   No current facility-administered medications for this visit.         Physical Exam BP (!) 96/57 (BP Location: Left Arm)   Pulse  63   Resp 16   Ht '5\' 5"'  (1.651 m)   BMI 26.09 kg/m  Gen:  WD/WN, NAD Skin: incision C/D/I     Assessment/Plan:  Hx of BKA, left (HCC) Wound is healing well.  Half of the staples were removed today.  Can return in about 2 weeks to remove the remainder of the staples.  At that point, should begin prosthesis evaluation.      Leotis Pain 02/01/2018, 1:03 PM   This note was created with Dragon medical transcription system.  Any errors from dictation are unintentional.

## 2018-02-02 ENCOUNTER — Other Ambulatory Visit: Payer: Self-pay | Admitting: *Deleted

## 2018-02-02 NOTE — Patient Outreach (Signed)
[facility]) Care Management   02/02/2018  Eric [name]. [birth year] [id number]  Eric [name]. is an 84 y.o. male  Subjective: Initial home visit for high risk HTA patient. I met with Mr. And Mrs. [name] today. Eric [name] came home from SNF last week, post op LBKA.  Objective:  Eric [name] is extremely stoic. Review of Systems  Constitutional: Negative.   HENT: Negative.   Eyes: Negative.   Respiratory: Negative.   Cardiovascular: Negative.   Gastrointestinal: Negative.   Genitourinary: Negative.   Musculoskeletal: Negative.   Skin: Negative.   Neurological: Negative.   Endo/Heme/Allergies: Negative.   Psychiatric/Behavioral: Negative.    BP (!) 120/50   Pulse 60   Resp 18   Ht 1.651 m ('5\' 5"'$ )   Wt 158 lb (71.7 kg)   SpO2 98%   BMI 26.29 kg/m  FBS 115.  Physical Exam  Constitutional: He is oriented to person, place, and time. He appears well-developed and well-nourished.  Cardiovascular:  Regular irregularity  Respiratory: Effort normal and breath sounds normal.  GI: Soft. Bowel sounds are normal.  Musculoskeletal:  Post op LBKA, dressing in place.  Neurological: He is alert and oriented to person, place, and time.  Skin: Skin is warm and dry.  Psychiatric: He has a normal mood and affect.   Patient was recently discharged from hospital and all medications have been reviewed.  Encounter Medications:   Outpatient Encounter Medications as of 02/02/2018  Medication Sig Note  . acetaminophen (TYLENOL) 325 MG tablet Take 2 tablets (650 mg total) by mouth every 6 (six) hours as needed for mild pain (or temp >/= 101 F).   [name] amLODipine (NORVASC) 2.5 MG tablet Take 1 tablet (2.5 mg total) by mouth daily.   [name] apixaban (ELIQUIS) 2.5 MG TABS tablet Take 1 tablet (2.5 mg total) by mouth 2 (two) times daily.   . B Complex Vitamins (B COMPLEX-B12) TABS Take 1 tablet by mouth daily.    . Blood Glucose Monitoring Suppl (ONE TOUCH ULTRA SYSTEM KIT)  W/DEVICE KIT 1 kit by Does not apply route once.   . calcium carbonate (TUMS - DOSED IN MG ELEMENTAL CALCIUM) 500 MG chewable tablet Chew 1 tablet by mouth daily as needed for indigestion or heartburn.   . cholecalciferol (VITAMIN D) 1000 UNITS tablet Take 1,000 Units by mouth 2 (two) times a week. Takes on Mon and Fri   . Cinnamon 500 MG capsule Take 1,000 mg by mouth 2 (two) times daily.    . feeding supplement, GLUCERNA SHAKE, (GLUCERNA SHAKE) LIQD Take 237 mLs by mouth daily.   . fenofibrate 160 MG tablet Take 1 tablet (160 mg total) by mouth daily.   . ferrous sulfate 325 (65 FE) MG EC tablet Take 325 mg by mouth 3 (three) times daily with meals.   . finasteride (PROSCAR) 5 MG tablet Take 1 tablet (5 mg total) by mouth daily.   . fish oil-omega-3 fatty acids 1000 MG capsule Take 1,000 mg by mouth daily.    [name] glucose blood test strip Use as instructed   . lisinopril (PRINIVIL,ZESTRIL) 20 MG tablet Take 1 tablet (20 mg total) by mouth daily.   . metoprolol tartrate (LOPRESSOR) 25 MG tablet Take 0.5 tablets (12.5 mg total) by mouth 2 (two) times daily.   . Multiple Vitamin (DAILY MULTIVITAMIN PO) Take 1 tablet by mouth daily.     . ondansetron (ZOFRAN) 4 MG tablet Take 4 mg by mouth every 6 (six)  hours as needed for nausea or vomiting.   [name] DELICA LANCETS FINE MISC check sugar once daily   . pentoxifylline (TRENTAL) 400 MG CR tablet Take 1 tablet (400 mg total) by mouth 3 (three) times daily with meals. 01/25/2018: Pt actually does have this and does take it tid.  . rosuvastatin (CRESTOR) 40 MG tablet Take 1 tablet (40 mg total) by mouth daily.   [name] saccharomyces boulardii (FLORASTOR) 250 MG capsule Take 1 capsule (250 mg total) by mouth daily.   . silver sulfADIAZINE (SILVADENE) 1 % cream Apply 1 application topically daily. Place to left lateral staple incision also place to right lateral foot   . tamsulosin (FLOMAX) 0.4 MG CAPS capsule Take 1 capsule (0.4 mg total) by mouth daily.   .  [DISCONTINUED] Balsam [place]-Castor Oil (VENELEX) OINT Apply 1 application topically 3 (three) times daily. Applied at every shift day, evening, and night to bilateral buttocks/sacrum area   . [DISCONTINUED] liver oil-zinc oxide (DESITIN) 40 % ointment Apply topically 3 (three) times daily between meals.    No facility-administered encounter medications on file as of 02/02/2018.     Functional Status:   In your present state of health, do you have any difficulty performing the following activities: 01/25/2018 12/27/2017  Hearing? Y Y  Comment - -  Vision? N N  Difficulty concentrating or making decisions? N N  Walking or climbing stairs? Y Y  Comment - -  Dressing or bathing? N Y  Comment - -  Doing errands, shopping? Y N  Preparing Food and eating ? N -  Using the Toilet? N -  In the past six months, have you accidently leaked urine? N -  Do you have problems with loss of bowel control? N -  Managing your Medications? Y -  Managing your Finances? N -  Housekeeping or managing your Housekeeping? Y -  Some recent data might be hidden    Fall/Depression Screening:    Fall Risk  01/25/2018 07/07/2017 06/30/2017  Falls in the past year? Yes No No  Number falls in past yr: 2 or more - -  Injury with Fall? No - -  Risk Factor Category  High Fall Risk - -  Risk for fall due to : History of fall(s);Impaired balance/gait;Impaired mobility - -  Risk for fall due to: Comment - - -  Follow up Falls evaluation completed;Falls prevention discussed - -   PHQ 2/9 Scores 01/25/2018 07/07/2017 06/30/2017 05/03/2017 05/03/2017 12/24/2015 10/17/2013  PHQ - 2 Score 1 0 0 0 0 0 0    Assessment:  Post op LBKA                          Diabetes, well controlled by diet alone.                          No Advanced Directives  THN CM Care Plan Problem One     Most Recent Value  Care Plan Problem One  New RBKA  Role Documenting the Problem One  Care Management Coordinator  Care Plan for Problem One  Active   THN Long Term Goal   Pt will progress per his report in his adaptation to being an amputee over next 31 days.  THN Long Term Goal Start Date  01/25/18  Interventions for Problem One Long Term Goal  Encouraged continued exercises to maintian muscle strength.  (Pended)  THN CM Short Term Goal #1   Pt will work with PT/OT for optimal and safe mobility over the next 30 days.  THN CM Short Term Goal #1 Start Date  01/25/18  Interventions for Short Term Goal #1  Will call [facility] to verify what services they are providing.  (Pended)   THN CM Short Term Goal #2   Pt will attend his MD appointments over the next week.  THN CM Short Term Goal #2 Start Date  01/25/18  [facility] CM Short Term Goal #2 Met Date  02/01/18    [facility] CM Care Plan Problem Two     Most Recent Value  Care Plan Problem Two  No Advanced Directives (only has POA)  (Pended)   Role Documenting the Problem Two  Care Management Coordinator  (Pended)   Care Plan for Problem Two  Active  (Pended)       Eulah Pont. Myrtie Neither, MSN, [facility] Gerontological Nurse Practitioner [facility] Care Management [phone]

## 2018-02-06 ENCOUNTER — Encounter: Payer: Self-pay | Admitting: Family Medicine

## 2018-02-07 MED ORDER — METOPROLOL TARTRATE 25 MG PO TABS: 12.5000 mg | ORAL_TABLET | Freq: Two times a day (BID) | ORAL | 0 refills | Status: DC

## 2018-02-09 ENCOUNTER — Other Ambulatory Visit: Payer: Self-pay | Admitting: *Deleted

## 2018-02-09 NOTE — Patient Outreach (Addendum)
Transition of care call post SNF stay. Mrs. Lobato reported to me on her husband today. He is out in his yard enjoying the early morning coolness. Mrs. Aust says everything is fine. Home health has been out to check on his new LBKA. He is using his manual w/c inside the house. His son purchased an IT trainer wheelchair for him to use outside the home.  Mrs. Zanni asks me about the status of the manual wheelchair which should have been a purchased item, not a rental. I will call [facility] and see if I can straighten this out. I did call and the procedure is that the pt pays 20%, insurance 80% for 13 months, this is really a rental fee, however after 13 months the pt does own the chair. I will advise pt and wife of this next week.  I will call them again next week.  Eulah Pont. Myrtie Neither, MSN, [facility] Gerontological Nurse Practitioner [facility] Care Management [phone]

## 2018-02-15 ENCOUNTER — Encounter (INDEPENDENT_AMBULATORY_CARE_PROVIDER_SITE_OTHER): Payer: Self-pay | Admitting: Vascular Surgery

## 2018-02-15 ENCOUNTER — Ambulatory Visit (INDEPENDENT_AMBULATORY_CARE_PROVIDER_SITE_OTHER): Payer: PPO | Admitting: Vascular Surgery

## 2018-02-15 VITALS — BP 104/59 | HR 65 | Resp 16 | Ht 65.0 in | Wt 155.0 lb

## 2018-02-15 DIAGNOSIS — Z89512 Acquired absence of left leg below knee: Secondary | ICD-10-CM

## 2018-02-15 DIAGNOSIS — I739 Peripheral vascular disease, unspecified: Secondary | ICD-10-CM

## 2018-02-15 NOTE — Progress Notes (Signed)
Subjective:    Patient ID: Eric [name]., male    DOB: 06-May-1928, 84 y.o.   MRN: [id number] Chief Complaint  Patient presents with  . Follow-up    Staple removal   Patient presents for his third postoperative follow-up and for removal of his final staples.  The patient is now out of rehab been residing back at home.  The patient presents today without complaint.  The patient's postoperative course continues to be uneventful.  The patient is able to completely straighten his leg.  The patient's incision continues to heal however does have a few areas with scabs on it.  The patient denies any erythema or drainage to the incision site.  The stump is healing well.  Denies any issues with the stump.  Patient denies any fever, nausea vomiting.  Review of Systems  Constitutional: Negative.   HENT: Negative.   Eyes: Negative.   Respiratory: Negative.   Cardiovascular: Negative.   Gastrointestinal: Negative.   Endocrine: Negative.   Genitourinary: Negative.   Musculoskeletal: Negative.   Skin: Negative.   Allergic/Immunologic: Negative.   Neurological: Negative.   Hematological: Negative.   Psychiatric/Behavioral: Negative.       Objective:   Physical Exam  Constitutional: He is oriented to person, place, and time. He appears well-developed and well-nourished. No distress.  HENT:  Head: Normocephalic and atraumatic.  Right Ear: External ear normal.  Left Ear: External ear normal.  Eyes: Pupils are equal, round, and reactive to light. Conjunctivae and EOM are normal.  Neck: Normal range of motion.  Cardiovascular: Normal rate, regular rhythm, normal heart sounds and intact distal pulses.  Pulses:      Radial pulses are 2+ on the right side, and 2+ on the left side.  Left below the knee amputation: Stump is healing well.  Remaining staples removed without complication.  There are 3 areas that have scabs noted to them.  At the medial aspect of the incision, in the middle which is  the largest and at the lateral aspect of the incision.  There is no erythema, cellulitis, drainage.  Incision is clean dry and intact.  Pulmonary/Chest: Effort normal and breath sounds normal.  Musculoskeletal: Normal range of motion. He exhibits no edema.  Neurological: He is alert and oriented to person, place, and time.  Skin: He is not diaphoretic.  Psychiatric: He has a normal mood and affect. His behavior is normal. Judgment and thought content normal.  Vitals reviewed.  BP (!) 104/59 (BP Location: Left Arm, Patient Position: Sitting)   Pulse 65   Resp 16   Ht 5' 5" (1.651 m)   Wt 155 lb (70.3 kg)   BMI 25.79 kg/m   Past Medical History:  Diagnosis Date  . Atrial flutter ([place])    s/p RFCA  . CAD (coronary artery disease)    cath 2003, occluded S-RCA, occluded S-Dx, L-LAD ok, s/p PTCA to LAD  . Cataract   . CKD (chronic kidney disease) stage 3, GFR 30-59 ml/min (HCC)   . Diabetes mellitus    diet controlled  . Hearing aid worn    bilateral  . History of kidney stones   . Long term (current) use of anticoagulants   . Neuromuscular disorder ([place])   . OSA (obstructive sleep apnea) 12/11   very mild, AHI 7/hr  . Persistent atrial fibrillation ([place]) 09/26/2015  . Pure hyperglyceridemia   . PVD (peripheral vascular disease) ([place])    angioplasty of his right lower extremity in  [place] by Dr.[name] 2013  . Unspecified essential hypertension   . Wears dentures    full upper and lower   Social History   Socioeconomic History  . Marital status: Married    Spouse name: Not on file  . Number of children: 5  . Years of education: Not on file  . Highest education level: Not on file  Occupational History  . Occupation: [employer]    Employer: RETIRED  Social Needs  . Financial resource strain: Not on file  . Food insecurity:    Worry: Not on file    Inability: Not on file  . Transportation needs:    Medical: Not on file    Non-medical: Not on file  Tobacco Use    . Smoking status: Never Smoker  . Smokeless tobacco: Former Network engineer and Sexual Activity  . Alcohol use: No  . Drug use: No  . Sexual activity: Never  Lifestyle  . Physical activity:    Days per week: Not on file    Minutes per session: Not on file  . Stress: Not on file  Relationships  . Social connections:    Talks on phone: Not on file    Gets together: Not on file    Attends religious service: Not on file    Active member of club or organization: Not on file    Attends meetings of clubs or organizations: Not on file    Relationship status: Not on file  . Intimate partner violence:    Fear of current or ex partner: Not on file    Emotionally abused: Not on file    Physically abused: Not on file    Forced sexual activity: Not on file  Other Topics Concern  . Not on file  Social History Narrative   Retired now does some antiques   Retired from Theatre manager and dye work   Married 1951   5 kids   Past Surgical History:  Procedure Laterality Date  . ABDOMINAL AORTOGRAM W/LOWER EXTREMITY N/A 11/15/2017   Procedure: ABDOMINAL AORTOGRAM W/LOWER EXTREMITY;  Surgeon: [name], MD;  Location: [place] [facility];  Service: Cardiovascular;  Laterality: N/A;  . Adenosine Myoview  3/06   EF 56%, neg. Ischemia  . Adenosine Myoview  02/18/07   nml  . AMPUTATION Left 12/15/2017   Procedure: AMPUTATION BELOW KNEE;  Surgeon: Algernon [name], MD;  Location: [facility];  Service: Vascular;  Laterality: Left;  . ANGIOPLASTY  1/99   CAD- diogonal with rotational artherectomy  . Arthrectomy     of LAD & PTCA  . BLEPHAROPLASTY Bilateral   . CARDIAC CATHETERIZATION  1/00  . CARDIOVERSION  1/04  . CARDIOVERSION  5/07   hospital- a flutter  . CARPAL TUNNEL RELEASE     ? bilateral  . CATARACT EXTRACTION W/PHACO Right 07/28/2017   Procedure: CATARACT EXTRACTION PHACO AND INTRAOCULAR LENS PLACEMENT ([place]) RIGHT DIABETIC;  Surgeon: [name], MD;  Location: [place];   Service: Ophthalmology;  Laterality: Right;  Diabetic - diet controlled  . CATARACT EXTRACTION W/PHACO Left 08/18/2017   Procedure: CATARACT EXTRACTION PHACO AND INTRAOCULAR LENS PLACEMENT ([facility]);  Surgeon: [name], MD;  Location: [place];  Service: Ophthalmology;  Laterality: Left;  DIABETES - oral meds  . COLONOSCOPY W/ BIOPSIES  10/01/06   sigmoid polyp bx neg, 3 years  . CORONARY ANGIOPLASTY  4/03   cutting balloon PTCA pLAD into Diag  . CORONARY ARTERY BYPASS  GRAFT  2000   LIMA-LAD, SVG-RCA, SVG-Diag; SVG-Diag & SVG-RCA occluded 2003  . FRACTURE SURGERY    . HAND SURGERY  08/27/09   R thumb procedure wit Scaphoid Gragt and screws, Dr [name]  . NM MYOVIEW LTD  4/11   normal  . PERIPHERAL VASCULAR CATHETERIZATION Right 06/27/2015   Procedure: Lower Extremity Angiography;  Surgeon: Algernon [name], MD;  Location: [place] [facility];  Service: Cardiovascular;  Laterality: Right;  . PERIPHERAL VASCULAR CATHETERIZATION  06/27/2015   Procedure: Lower Extremity Intervention;  Surgeon: Algernon [name], MD;  Location: [place] [facility];  Service: Cardiovascular;;   Family History  Problem Relation Age of Onset  . Stroke Father   . Aneurysm Father   . Hypertension Father   . Prostate cancer Brother   . Hypertension Mother   . Lung cancer Brother        smoker  . Thrombosis Brother   . Other Brother        RF valve disorder (smoker)  . Lung cancer Brother        smoker  . Breast cancer Sister   . Liver cancer Brother   . Other Sister        cerebral hemorrhage   Allergies  Allergen Reactions  . Isosorbide Mononitrate Other (See Comments)    Unknown, doesn't remember   . Ramipril Cough  . Quinidine Gluconate Rash      Assessment & Plan:  Patient presents for his third postoperative follow-up and for removal of his final staples.  The patient is now out of rehab been residing back at home.  The patient presents today without complaint.  The patient's  postoperative course continues to be uneventful.  The patient is able to completely straighten his leg.  The patient's incision continues to heal however does have a few areas with scabs on it.  The patient denies any erythema or drainage to the incision site.  The stump is healing well.  Denies any issues with the stump.  Patient denies any fever, nausea vomiting.  1. Hx of BKA, left ([place]) - Stable Remaining staples removed without complication There are 3 areas of scabs noted to the incision.  There are not necrotic areas.  Otherwise the incision is clean dry and intact. The stump is healing well The patient is able to completely straighten his leg I would like to see him back in 2 weeks just to monitor the areas with scabs on it to make sure the stump continues to heal well If there is any change in the condition of his stump the patient is to call the office sooner I will send the patient's information to biotech to start the prosthetic process  2. PVD (peripheral vascular disease) ([place]) - Stable We will continue to monitor the patient's right lower extremity peripheral artery disease At this time the patient is asymptomatic This is followed regularly  Current Outpatient Medications on File Prior to Visit  Medication Sig Dispense Refill  . acetaminophen (TYLENOL) 325 MG tablet Take 2 tablets (650 mg total) by mouth every 6 (six) hours as needed for mild pain (or temp >/= 101 F).    [name] amLODipine (NORVASC) 10 MG tablet     . apixaban (ELIQUIS) 2.5 MG TABS tablet Take 1 tablet (2.5 mg total) by mouth 2 (two) times daily. 180 tablet 1  . B Complex Vitamins (B COMPLEX-B12) TABS Take 1 tablet by mouth daily.     . Blood Glucose Monitoring  Suppl (ONE TOUCH ULTRA SYSTEM KIT) W/DEVICE KIT 1 kit by Does not apply route once. 1 each 0  . calcium carbonate (TUMS - DOSED IN MG ELEMENTAL CALCIUM) 500 MG chewable tablet Chew 1 tablet by mouth daily as needed for indigestion or heartburn.    .  cholecalciferol (VITAMIN D) 1000 UNITS tablet Take 1,000 Units by mouth 2 (two) times a week. Takes on Mon and Fri    . Cinnamon 500 MG capsule Take 1,000 mg by mouth 2 (two) times daily.     . feeding supplement, GLUCERNA SHAKE, (GLUCERNA SHAKE) LIQD Take 237 mLs by mouth daily.    . fenofibrate 160 MG tablet Take 1 tablet (160 mg total) by mouth daily. 90 tablet 2  . ferrous sulfate 325 (65 FE) MG EC tablet Take 325 mg by mouth 3 (three) times daily with meals.    . finasteride (PROSCAR) 5 MG tablet Take 1 tablet (5 mg total) by mouth daily. 30 tablet 0  . fish oil-omega-3 fatty acids 1000 MG capsule Take 1,000 mg by mouth daily.     [name] glucose blood test strip Use as instructed 100 each 3  . lisinopril (PRINIVIL,ZESTRIL) 20 MG tablet Take 1 tablet (20 mg total) by mouth daily. 30 tablet 0  . metoprolol tartrate (LOPRESSOR) 25 MG tablet Take 0.5 tablets (12.5 mg total) by mouth 2 (two) times daily. 30 tablet 0  . Multiple Vitamin (DAILY MULTIVITAMIN PO) Take 1 tablet by mouth daily.      . ondansetron (ZOFRAN) 4 MG tablet Take 4 mg by mouth every 6 (six) hours as needed for nausea or vomiting.    [name] DELICA LANCETS FINE MISC check sugar once daily 100 each 2  . pentoxifylline (TRENTAL) 400 MG CR tablet Take 1 tablet (400 mg total) by mouth 3 (three) times daily with meals. 90 tablet 3  . rosuvastatin (CRESTOR) 40 MG tablet Take 1 tablet (40 mg total) by mouth daily. 90 tablet 2  . saccharomyces boulardii (FLORASTOR) 250 MG capsule Take 1 capsule (250 mg total) by mouth daily. 90 capsule 3  . silver sulfADIAZINE (SILVADENE) 1 % cream Apply 1 application topically daily. Place to left lateral staple incision also place to right lateral foot    . tamsulosin (FLOMAX) 0.4 MG CAPS capsule Take 1 capsule (0.4 mg total) by mouth daily. 30 capsule 0   No current facility-administered medications on file prior to visit.    There are no Patient Instructions on file for this visit. No follow-ups on  file.  KIMBERLY A STEGMAYER, PA-C

## 2018-02-16 ENCOUNTER — Other Ambulatory Visit: Payer: Self-pay | Admitting: *Deleted

## 2018-02-16 NOTE — Patient Outreach (Signed)
[facility]) Care Management  02/16/2018  Eric [name]. [birth year] [id number]   Home visit in lieu of a transition of care call. Pt has been receiving Nursing & PT for 2 weeks and he will receive one more visit. He had his staples removed from his stump incision. Steristrips were put in place. He was given instructions to clean the wound daily and apply silvadene to the scabbed areas and then a bandage wrap.  BP (!) 120/50   Pulse 78   Resp 18   SpO2 98%  RRRLungs are clear Extremities: LBKA, surgical line is approximated in approx 75% of the incision. 25% of the edges are slightly open, no purulent drainage the wound bed is moist and not odorous.                     R leg, no edema, 1+ Pulses, long nails, 3rd nail is discolored and thickened. He has a lateral, 5th metatarsal head, plantar callus which is scabbed and the dead skin is easily                          debrided with the nail trimmers. No bleeding or discomfort was noted.  A: Diabetes with PVD, LBKA, R metatarsal callus     Unwillingness to complete his Advanced DIrectives  P:  THN CM Care Plan Problem One     Most Recent Value  Care Plan Problem One  New RBKA  (Pended)   Role Documenting the Problem One  Care Management Coordinator  (Pended)   Care Plan for Problem One  Active  (Pended)   THN Long Term Goal   Pt will progress per his report in his adaptation to being an amputee over next 31 days.  (Pended)   THN Long Term Goal Start Date  01/25/18  (Pended)   Interventions for Problem One Long Term Goal  REinforced use of either wheel chair but the manual will help keep his upper extremiity strength.  (Pended)   THN CM Short Term Goal #1   Pt will work with PT/OT for optimal and safe mobility over the next 30 days.  (Pended)   THN CM Short Term Goal #1 Start Date  01/25/18  (Pended)   Interventions for Short Term Goal #1  --  (Pended)  [Advised: Safety first in all situations.]  THN CM Short Term  Goal #2   Pt will attend his MD appointments over the next week.  (Pended)   THN CM Short Term Goal #2 Start Date  01/25/18  (Pended)   THN CM Short Term Goal #2 Met Date  02/01/18  (Pended)     THN CM Care Plan Problem Two     Most Recent Value  Care Plan Problem Two  No Advanced Directives (only has POA)  (Pended)   Role Documenting the Problem Two  Care Management Coordinator  (Pended)   Comfort for Problem Two  Active  (Pended)   Interventions for Problem Two Long Term Goal   Educated and encouraged pt to put his decisions in writing.  (Pended)   THN Long Term Goal  Pt will complete HCPOA, Livng will and MOST form by the end of 60 days.  (Pended)   THN Long Term Goal Start Date  02/02/18  (Pended)      I will call him next week and see him in two weeks.  Eulah Pont. Eric Mosqueda, MSN, GNP-BC  Gerontological Nurse Practitioner [facility] Care Management [phone]

## 2018-02-21 ENCOUNTER — Encounter: Payer: Self-pay | Admitting: Family Medicine

## 2018-02-21 DIAGNOSIS — R338 Other retention of urine: Secondary | ICD-10-CM | POA: Diagnosis not present

## 2018-02-22 MED ORDER — TAMSULOSIN HCL 0.4 MG PO CAPS: 0.4000 mg | ORAL_CAPSULE | Freq: Every day | ORAL | 3 refills | Status: DC

## 2018-02-23 ENCOUNTER — Encounter: Payer: Self-pay | Admitting: Family Medicine

## 2018-02-23 ENCOUNTER — Other Ambulatory Visit: Payer: Self-pay | Admitting: *Deleted

## 2018-02-23 DIAGNOSIS — S88112D Complete traumatic amputation at level between knee and ankle, left lower leg, subsequent encounter: Secondary | ICD-10-CM | POA: Diagnosis not present

## 2018-02-23 NOTE — Patient Outreach (Addendum)
Transition of care call. Mrs. Cordial reports her husband is doing fine. His mobility and transfers and his independence has improved greatly. He has still been getting some PT. He has not had any falls.  I asked Mrs. Pavone if they had talked about their end of life decisions and worked on [employer], Living Will and MOST form. She said they discussed it briefly and they don't like the idea of anyone telling them what to do. I explained that is exactly the point, to have your own wishes written down in these documents so YOUR WISHES can be followed, NOT someone else. She says they are pretty set in their thinking that they will not do this. I will forward this note to [name], perhaps he can have an influence to encourage them to complete these important documents.  I will visit them next Wednesday.  THN CM Care Plan Problem One     Most Recent Value  Care Plan Problem One  New RBKA  Role Documenting the Problem One  Care Management Coordinator  Care Plan for Problem One  Active  THN Long Term Goal   Pt will progress per his report in his adaptation to being an amputee over next 31 days.  THN Long Term Goal Start Date  01/25/18  THN Long Term Goal Met Date  02/23/18  THN CM Short Term Goal #1   Pt will work with PT/OT for optimal and safe mobility over the next 30 days.  THN CM Short Term Goal #1 Start Date  01/25/18  THN CM Short Term Goal #1 Met Date  02/23/18  THN CM Short Term Goal #2   Pt will attend his MD appointments over the next week.  THN CM Short Term Goal #2 Start Date  01/25/18  THN CM Short Term Goal #2 Met Date  02/01/18  THN CM Short Term Goal #3  Pt will do daily wound are as intructed and if any sign of infection is seen he or his wife will call MD for instructions, until the wound is healed (30 days).  THN CM Short Term Goal #3 Start Date  02/16/18  Interventions for Short Tern Goal #3  Encouraged to do wound care daily and follow MD instructions.    THN CM Care Plan  Problem Two     Most Recent Value  Care Plan Problem Two  No Advanced Directives (only has POA)  Role Documenting the Problem Two  Care Management Coordinator  Care Plan for Problem Two  Active  Interventions for Problem Two Long Term Goal   Discussed and reinforced these doucments are so that YOUR WISHES are carried out not someone elses. Will advise PCP to discuss.  THN Long Term Goal  Pt will complete HCPOA, Livng will and MOST form by the end of 60 days.  THN Long Term Goal Start Date  02/02/18     Eulah Pont. Myrtie Neither, MSN, [facility] Gerontological Nurse Practitioner [facility] Care Management [phone]

## 2018-02-24 ENCOUNTER — Encounter: Payer: Self-pay | Admitting: Family Medicine

## 2018-02-24 ENCOUNTER — Ambulatory Visit (INDEPENDENT_AMBULATORY_CARE_PROVIDER_SITE_OTHER): Payer: PPO | Admitting: Family Medicine

## 2018-02-24 VITALS — BP 108/48 | HR 63 | Temp 98.2°F | Resp 14

## 2018-02-24 DIAGNOSIS — A09 Infectious gastroenteritis and colitis, unspecified: Secondary | ICD-10-CM | POA: Diagnosis not present

## 2018-02-24 MED ORDER — VANCOMYCIN HCL 125 MG PO CAPS: 125.0000 mg | ORAL_CAPSULE | Freq: Four times a day (QID) | ORAL | 0 refills | Status: DC

## 2018-02-24 NOTE — Progress Notes (Signed)
Subjective:    Patient ID: Eric [name]., male    DOB: 08-26-1927, 84 y.o.   MRN: [id number]  HPI 01/26/18 Patient is here today for hospital discharge follow-up.  When I last saw the patient, he had a necrotic nonhealing ulcer on the left first MTP joint extending over into the left great toe and onto the dorsum of the left foot with dry gangrene.  He been treated with numerous rounds of antibiotics and that also subsequently developed C. difficile colitis.  He was admitted to the hospital and underwent a below the knee amputation of the left leg under the care of [name].  His C. difficile colitis was treated in the hospital with oral vancomycin.  Patient was discharged from the hospital to a skilled nursing facility for rehab.  He was readmitted to the hospital on 2 separate occasions once with dehydration and Pseudomonas UTI and the second time for dehydration and acute kidney injury.  He is here today for follow-up.  I examined the patient's stump.  Staples are still in place.  The operative site is intact.  There is black scab in the lateral aspect of the operative site but there is no wound dehiscence and there is no evidence of cellulitis.  This appears to be a thick dry scab.  He is seeing his vascular surgeon again on Tuesday for another wound check.  He has not been fitted yet for a prosthetic device he is still confined to a wheelchair but he is back home.  He denies any diarrhea.  He has 3-4 bowel movements a day.  They are soft but he denies any watery, melanotic, or foul-smelling stool.  He denies any dysuria fevers chills or urgency.  AT that time, my plan was: Follow-up with [name] as planned.  Once staples have been removed and the patient has been cleared, I would like to have him fitted for a prosthetic device.  We will arrange this if not already done so by his surgeon once he has been surgically cleared.  In the meantime patient is nonweightbearing on his left leg and is confined  to a wheelchair.  I would like to repeat a urine sample to ensure complete resolution of his Pseudomonas UTI.  I would obtain a CBC to monitor for anemia postoperatively.  I will also check a CMP to evaluate for any electrolyte disturbances or evidence of dehydration.  Patient is still taking a probiotic which given his recent severe C. difficile colitis I believe is a good idea.  The wound was dressed today with Silvadene over the operative site, covered with nonadherent gauze, and then wrapping Coban.  02/24/18 Patient has a history of C. difficile colitis prior to his hospitalization.  He states that 10 days ago he started developing watery diarrhea.  Over the last 2 to 3 days the diarrhea has become constant.  He is now having loose bowel movements every 1-2 hours at home.  The majority the time is just water.  He denies any fevers or chills or abdominal pain however the diarrhea is extremely foul-smelling.  He is concerned that this could be a recurrence.  He denies any recent travel.  He denies any medication changes.  He denies any sick contacts. Past Medical History:  Diagnosis Date  . Atrial flutter ([place])    s/p RFCA  . CAD (coronary artery disease)    cath 2003, occluded S-RCA, occluded S-Dx, L-LAD ok, s/p PTCA to LAD  .  Cataract   . CKD (chronic kidney disease) stage 3, GFR 30-59 ml/min (HCC)   . Diabetes mellitus    diet controlled  . Hearing aid worn    bilateral  . History of kidney stones   . Long term (current) use of anticoagulants   . Neuromuscular disorder ([place])   . OSA (obstructive sleep apnea) 12/11   very mild, AHI 7/hr  . Persistent atrial fibrillation ([place]) 09/26/2015  . Pure hyperglyceridemia   . PVD (peripheral vascular disease) ([place])    angioplasty of his right lower extremity in [place] by Dr.[name] 2013  . Unspecified essential hypertension   . Wears dentures    full upper and lower   Past Surgical History:  Procedure Laterality Date  . ABDOMINAL AORTOGRAM  W/LOWER EXTREMITY N/A 11/15/2017   Procedure: ABDOMINAL AORTOGRAM W/LOWER EXTREMITY;  Surgeon: [name], MD;  Location: [place] [facility];  Service: Cardiovascular;  Laterality: N/A;  . Adenosine Myoview  3/06   EF 56%, neg. Ischemia  . Adenosine Myoview  02/18/07   nml  . AMPUTATION Left 12/15/2017   Procedure: AMPUTATION BELOW KNEE;  Surgeon: Algernon [name], MD;  Location: [facility];  Service: Vascular;  Laterality: Left;  . ANGIOPLASTY  1/99   CAD- diogonal with rotational artherectomy  . Arthrectomy     of LAD & PTCA  . BLEPHAROPLASTY Bilateral   . CARDIAC CATHETERIZATION  1/00  . CARDIOVERSION  1/04  . CARDIOVERSION  5/07   hospital- a flutter  . CARPAL TUNNEL RELEASE     ? bilateral  . CATARACT EXTRACTION W/PHACO Right 07/28/2017   Procedure: CATARACT EXTRACTION PHACO AND INTRAOCULAR LENS PLACEMENT ([place]) RIGHT DIABETIC;  Surgeon: [name], MD;  Location: [place];  Service: Ophthalmology;  Laterality: Right;  Diabetic - diet controlled  . CATARACT EXTRACTION W/PHACO Left 08/18/2017   Procedure: CATARACT EXTRACTION PHACO AND INTRAOCULAR LENS PLACEMENT (IOC);  Surgeon: [name], MD;  Location: [place];  Service: Ophthalmology;  Laterality: Left;  DIABETES - oral meds  . COLONOSCOPY W/ BIOPSIES  10/01/06   sigmoid polyp bx neg, 3 years  . CORONARY ANGIOPLASTY  4/03   cutting balloon PTCA pLAD into Diag  . CORONARY ARTERY BYPASS GRAFT  2000   LIMA-LAD, SVG-RCA, SVG-Diag; SVG-Diag & SVG-RCA occluded 2003  . FRACTURE SURGERY    . HAND SURGERY  08/27/09   R thumb procedure wit Scaphoid Gragt and screws, Dr [name]  . NM MYOVIEW LTD  4/11   normal  . PERIPHERAL VASCULAR CATHETERIZATION Right 06/27/2015   Procedure: Lower Extremity Angiography;  Surgeon: Algernon [name], MD;  Location: [place] [facility];  Service: Cardiovascular;  Laterality: Right;  . PERIPHERAL VASCULAR CATHETERIZATION  06/27/2015   Procedure: Lower Extremity Intervention;   Surgeon: Algernon [name], MD;  Location: [place] [facility];  Service: Cardiovascular;;   Current Outpatient Medications on File Prior to Visit  Medication Sig Dispense Refill  . acetaminophen (TYLENOL) 325 MG tablet Take 2 tablets (650 mg total) by mouth every 6 (six) hours as needed for mild pain (or temp >/= 101 F).    [name] amLODipine (NORVASC) 10 MG tablet     . apixaban (ELIQUIS) 2.5 MG TABS tablet Take 1 tablet (2.5 mg total) by mouth 2 (two) times daily. 180 tablet 1  . B Complex Vitamins (B COMPLEX-B12) TABS Take 1 tablet by mouth daily.     . Blood Glucose Monitoring Suppl (ONE TOUCH ULTRA SYSTEM KIT) W/DEVICE KIT 1  kit by Does not apply route once. 1 each 0  . calcium carbonate (TUMS - DOSED IN MG ELEMENTAL CALCIUM) 500 MG chewable tablet Chew 1 tablet by mouth daily as needed for indigestion or heartburn.    . cholecalciferol (VITAMIN D) 1000 UNITS tablet Take 1,000 Units by mouth 2 (two) times a week. Takes on Mon and Fri    . Cinnamon 500 MG capsule Take 1,000 mg by mouth 2 (two) times daily.     . feeding supplement, GLUCERNA SHAKE, (GLUCERNA SHAKE) LIQD Take 237 mLs by mouth daily.    . fenofibrate 160 MG tablet Take 1 tablet (160 mg total) by mouth daily. 90 tablet 2  . ferrous sulfate 325 (65 FE) MG EC tablet Take 325 mg by mouth 3 (three) times daily with meals.    . finasteride (PROSCAR) 5 MG tablet Take 1 tablet (5 mg total) by mouth daily. 30 tablet 0  . fish oil-omega-3 fatty acids 1000 MG capsule Take 1,000 mg by mouth daily.     [name] glucose blood test strip Use as instructed 100 each 3  . lisinopril (PRINIVIL,ZESTRIL) 20 MG tablet Take 1 tablet (20 mg total) by mouth daily. 30 tablet 0  . metoprolol tartrate (LOPRESSOR) 25 MG tablet Take 0.5 tablets (12.5 mg total) by mouth 2 (two) times daily. 30 tablet 0  . Multiple Vitamin (DAILY MULTIVITAMIN PO) Take 1 tablet by mouth daily.      . ondansetron (ZOFRAN) 4 MG tablet Take 4 mg by mouth every 6 (six) hours as needed for nausea  or vomiting.    [name] DELICA LANCETS FINE MISC check sugar once daily 100 each 2  . pentoxifylline (TRENTAL) 400 MG CR tablet Take 1 tablet (400 mg total) by mouth 3 (three) times daily with meals. 90 tablet 3  . rosuvastatin (CRESTOR) 40 MG tablet Take 1 tablet (40 mg total) by mouth daily. 90 tablet 2  . saccharomyces boulardii (FLORASTOR) 250 MG capsule Take 1 capsule (250 mg total) by mouth daily. 90 capsule 3  . silver sulfADIAZINE (SILVADENE) 1 % cream Apply 1 application topically daily. Place to left lateral staple incision also place to right lateral foot    . tamsulosin (FLOMAX) 0.4 MG CAPS capsule Take 1 capsule (0.4 mg total) by mouth daily. 90 capsule 3   No current facility-administered medications on file prior to visit.    Allergies  Allergen Reactions  . Isosorbide Mononitrate Other (See Comments)    Unknown, doesn't remember   . Ramipril Cough  . Quinidine Gluconate Rash   Social History   Socioeconomic History  . Marital status: Married    Spouse name: Not on file  . Number of children: 5  . Years of education: Not on file  . Highest education level: Not on file  Occupational History  . Occupation: [employer]    Employer: RETIRED  Social Needs  . Financial resource strain: Not on file  . Food insecurity:    Worry: Not on file    Inability: Not on file  . Transportation needs:    Medical: Not on file    Non-medical: Not on file  Tobacco Use  . Smoking status: Never Smoker  . Smokeless tobacco: Former Network engineer and Sexual Activity  . Alcohol use: No  . Drug use: No  . Sexual activity: Never  Lifestyle  . Physical activity:    Days per week: Not on file    Minutes per session:  Not on file  . Stress: Not on file  Relationships  . Social connections:    Talks on phone: Not on file    Gets together: Not on file    Attends religious service: Not on file    Active member of club or organization: Not on file    Attends meetings of clubs  or organizations: Not on file    Relationship status: Not on file  . Intimate partner violence:    Fear of current or ex partner: Not on file    Emotionally abused: Not on file    Physically abused: Not on file    Forced sexual activity: Not on file  Other Topics Concern  . Not on file  Social History Narrative   Retired now does some antiques   Retired from Theatre manager and dye work   Married 1951   5 kids     Review of Systems  All other systems reviewed and are negative.      Objective:   Physical Exam  Constitutional: He appears well-developed and well-nourished. No distress.  Eyes: Conjunctivae are normal. Right eye exhibits no discharge. Left eye exhibits no discharge. No scleral icterus.  Neck: Neck supple. No JVD present.  Cardiovascular: Normal rate and normal heart sounds. An irregularly irregular rhythm present.  Pulmonary/Chest: Effort normal and breath sounds normal. No stridor. No respiratory distress. He has no wheezes. He has no rales.  Abdominal: Soft. Bowel sounds are normal. He exhibits no distension. There is no tenderness. There is no guarding.  Musculoskeletal: He exhibits no edema.  Lymphadenopathy:    He has no cervical adenopathy.  Skin: He is not diaphoretic.  Vitals reviewed.       Assessment & Plan:  Diarrhea of infectious origin - Plan: C difficile Toxins A+B W/Rflx, Gastrointestinal Pathogen Panel PCR  Given his previous history, I am concerned about C. difficile colitis as well.  Given his frailty and his advanced age, I do not want to delay treatment.  I will send him home with stool collection kit tonight to perform a GI pathogen panel as well as to evaluate for C. difficile toxin A and B.  As soon as he obtains the specimen, I want him to start vancomycin 125 mg p.o. 4 times daily for at least 14 days.  We may need to perform a prolonged taper if in fact this is C. difficile colitis.  Recommended that he push Gatorade to avoid dehydration and  recheck on Monday

## 2018-02-25 ENCOUNTER — Encounter: Payer: Self-pay | Admitting: Family Medicine

## 2018-02-25 ENCOUNTER — Other Ambulatory Visit: Payer: PPO

## 2018-02-25 DIAGNOSIS — S88112D Complete traumatic amputation at level between knee and ankle, left lower leg, subsequent encounter: Secondary | ICD-10-CM | POA: Diagnosis not present

## 2018-02-25 DIAGNOSIS — E11621 Type 2 diabetes mellitus with foot ulcer: Secondary | ICD-10-CM | POA: Diagnosis not present

## 2018-02-25 DIAGNOSIS — A09 Infectious gastroenteritis and colitis, unspecified: Secondary | ICD-10-CM | POA: Diagnosis not present

## 2018-02-25 MED ORDER — FINASTERIDE 5 MG PO TABS: 5.0000 mg | ORAL_TABLET | Freq: Every day | ORAL | 0 refills | Status: DC

## 2018-02-25 NOTE — Telephone Encounter (Signed)
Ok to refill 

## 2018-03-01 ENCOUNTER — Ambulatory Visit (INDEPENDENT_AMBULATORY_CARE_PROVIDER_SITE_OTHER): Payer: PPO | Admitting: Vascular Surgery

## 2018-03-01 ENCOUNTER — Encounter (INDEPENDENT_AMBULATORY_CARE_PROVIDER_SITE_OTHER): Payer: Self-pay | Admitting: Vascular Surgery

## 2018-03-01 ENCOUNTER — Encounter: Payer: Self-pay | Admitting: Family Medicine

## 2018-03-01 VITALS — BP 122/60 | HR 70 | Resp 16 | Ht 65.0 in | Wt 155.0 lb

## 2018-03-01 DIAGNOSIS — I739 Peripheral vascular disease, unspecified: Secondary | ICD-10-CM

## 2018-03-01 DIAGNOSIS — Z89512 Acquired absence of left leg below knee: Secondary | ICD-10-CM

## 2018-03-01 LAB — C DIFFICILE TOXINS A+B W/RFLX: C difficile Toxins A+B, EIA: POSITIVE — AB

## 2018-03-01 NOTE — Assessment & Plan Note (Signed)
Healing reasonably well.  Okay to go ahead and start using the stump shrinker and having his prosthetic evaluation.  Recheck in 3 months

## 2018-03-01 NOTE — Assessment & Plan Note (Signed)
Check right ABI in about 3 months.

## 2018-03-01 NOTE — Progress Notes (Signed)
Patient ID: Eric [name]., male   DOB: 09-06-1927, 84 y.o.   MRN: [id number]  Chief Complaint  Patient presents with  . Follow-up    2 week wound check    HPI Eric [name]. is a 84 y.o. male.  Patient returns in follow up.  His wound is doing well.  He has 3 scabs on his below-knee amputation but no signs of infection or drainage.  No significant pain.  The prosthetic company has contacted him.   Past Medical History:  Diagnosis Date  . Atrial flutter ([name])    s/p RFCA  . CAD (coronary artery disease)    cath 2003, occluded S-RCA, occluded S-Dx, L-LAD ok, s/p PTCA to LAD  . Cataract   . CKD (chronic kidney disease) stage 3, GFR 30-59 ml/min (HCC)   . Diabetes mellitus    diet controlled  . Hearing aid worn    bilateral  . History of kidney stones   . Long term (current) use of anticoagulants   . Neuromuscular disorder ([name])   . OSA (obstructive sleep apnea) 12/11   very mild, AHI 7/hr  . Persistent atrial fibrillation ([name]) 09/26/2015  . Pure hyperglyceridemia   . PVD (peripheral vascular disease) ([place])    angioplasty of his right lower extremity in [place] by Dr.[name] 2013  . Unspecified essential hypertension   . Wears dentures    full upper and lower    Past Surgical History:  Procedure Laterality Date  . ABDOMINAL AORTOGRAM W/LOWER EXTREMITY N/A 11/15/2017   Procedure: ABDOMINAL AORTOGRAM W/LOWER EXTREMITY;  Surgeon: [name], MD;  Location: [place] [facility];  Service: Cardiovascular;  Laterality: N/A;  . Adenosine Myoview  3/06   EF 56%, neg. Ischemia  . Adenosine Myoview  02/18/07   nml  . AMPUTATION Left 12/15/2017   Procedure: AMPUTATION BELOW KNEE;  Surgeon: Algernon [name], MD;  Location: [facility];  Service: Vascular;  Laterality: Left;  . ANGIOPLASTY  1/99   CAD- diogonal with rotational artherectomy  . Arthrectomy     of LAD & PTCA  . BLEPHAROPLASTY Bilateral   . CARDIAC CATHETERIZATION  1/00  . CARDIOVERSION  1/04  . CARDIOVERSION   5/07   hospital- a flutter  . CARPAL TUNNEL RELEASE     ? bilateral  . CATARACT EXTRACTION W/PHACO Right 07/28/2017   Procedure: CATARACT EXTRACTION PHACO AND INTRAOCULAR LENS PLACEMENT ([place]) RIGHT DIABETIC;  Surgeon: [name], MD;  Location: [place];  Service: Ophthalmology;  Laterality: Right;  Diabetic - diet controlled  . CATARACT EXTRACTION W/PHACO Left 08/18/2017   Procedure: CATARACT EXTRACTION PHACO AND INTRAOCULAR LENS PLACEMENT ([facility]);  Surgeon: [name], MD;  Location: [place];  Service: Ophthalmology;  Laterality: Left;  DIABETES - oral meds  . COLONOSCOPY W/ BIOPSIES  10/01/06   sigmoid polyp bx neg, 3 years  . CORONARY ANGIOPLASTY  4/03   cutting balloon PTCA pLAD into Diag  . CORONARY ARTERY BYPASS GRAFT  2000   LIMA-LAD, SVG-RCA, SVG-Diag; SVG-Diag & SVG-RCA occluded 2003  . FRACTURE SURGERY    . HAND SURGERY  08/27/09   R thumb procedure wit Scaphoid Gragt and screws, Dr [name]  . NM MYOVIEW LTD  4/11   normal  . PERIPHERAL VASCULAR CATHETERIZATION Right 06/27/2015   Procedure: Lower Extremity Angiography;  Surgeon: Algernon [name], MD;  Location: [place] [facility];  Service: Cardiovascular;  Laterality: Right;  . PERIPHERAL VASCULAR CATHETERIZATION  06/27/2015  Procedure: Lower Extremity Intervention;  Surgeon: Algernon [name], MD;  Location: [place] [facility];  Service: Cardiovascular;;      Allergies  Allergen Reactions  . Isosorbide Mononitrate Other (See Comments)    Unknown, doesn't remember   . Ramipril Cough  . Quinidine Gluconate Rash    Current Outpatient Medications  Medication Sig Dispense Refill  . acetaminophen (TYLENOL) 325 MG tablet Take 2 tablets (650 mg total) by mouth every 6 (six) hours as needed for mild pain (or temp >/= 101 F).    [name] amLODipine (NORVASC) 10 MG tablet     . apixaban (ELIQUIS) 2.5 MG TABS tablet Take 1 tablet (2.5 mg total) by mouth 2 (two) times daily. 180 tablet 1  . B Complex  Vitamins (B COMPLEX-B12) TABS Take 1 tablet by mouth daily.     . Blood Glucose Monitoring Suppl (ONE TOUCH ULTRA SYSTEM KIT) W/DEVICE KIT 1 kit by Does not apply route once. 1 each 0  . calcium carbonate (TUMS - DOSED IN MG ELEMENTAL CALCIUM) 500 MG chewable tablet Chew 1 tablet by mouth daily as needed for indigestion or heartburn.    . cholecalciferol (VITAMIN D) 1000 UNITS tablet Take 1,000 Units by mouth 2 (two) times a week. Takes on Mon and Fri    . Cinnamon 500 MG capsule Take 1,000 mg by mouth 2 (two) times daily.     . feeding supplement, GLUCERNA SHAKE, (GLUCERNA SHAKE) LIQD Take 237 mLs by mouth daily.    . fenofibrate 160 MG tablet Take 1 tablet (160 mg total) by mouth daily. 90 tablet 2  . ferrous sulfate 325 (65 FE) MG EC tablet Take 325 mg by mouth 3 (three) times daily with meals.    . finasteride (PROSCAR) 5 MG tablet Take 1 tablet (5 mg total) by mouth daily. 30 tablet 0  . fish oil-omega-3 fatty acids 1000 MG capsule Take 1,000 mg by mouth daily.     [name] glucose blood test strip Use as instructed 100 each 3  . lisinopril (PRINIVIL,ZESTRIL) 20 MG tablet Take 1 tablet (20 mg total) by mouth daily. 30 tablet 0  . metoprolol tartrate (LOPRESSOR) 25 MG tablet Take 0.5 tablets (12.5 mg total) by mouth 2 (two) times daily. 30 tablet 0  . Multiple Vitamin (DAILY MULTIVITAMIN PO) Take 1 tablet by mouth daily.      . ondansetron (ZOFRAN) 4 MG tablet Take 4 mg by mouth every 6 (six) hours as needed for nausea or vomiting.    [name] DELICA LANCETS FINE MISC check sugar once daily 100 each 2  . pentoxifylline (TRENTAL) 400 MG CR tablet Take 1 tablet (400 mg total) by mouth 3 (three) times daily with meals. 90 tablet 3  . rosuvastatin (CRESTOR) 40 MG tablet Take 1 tablet (40 mg total) by mouth daily. 90 tablet 2  . saccharomyces boulardii (FLORASTOR) 250 MG capsule Take 1 capsule (250 mg total) by mouth daily. 90 capsule 3  . silver sulfADIAZINE (SILVADENE) 1 % cream Apply 1 application  topically daily. Place to left lateral staple incision also place to right lateral foot    . tamsulosin (FLOMAX) 0.4 MG CAPS capsule Take 1 capsule (0.4 mg total) by mouth daily. 90 capsule 3  . vancomycin (VANCOCIN) 125 MG capsule Take 1 capsule (125 mg total) by mouth 4 (four) times daily. 40 capsule 0   No current facility-administered medications for this visit.         Physical Exam BP 122/60 (BP  Location: Left Arm)   Pulse 70   Resp 16   Ht '5\' 5"'  (1.651 m)   Wt 155 lb (70.3 kg)   BMI 25.79 kg/m  Gen:  WD/WN, NAD Skin: incision C/D/I with three small to medium sized scabs present.  The one in the mid wound has some fibrinous exudate below.     Assessment/Plan:  PVD (peripheral vascular disease) ([place]) Check right ABI in about 3 months.  Hx of BKA, left ([place]) Healing reasonably well.  Okay to go ahead and start using the stump shrinker and having his prosthetic evaluation.  Recheck in 3 months      Leotis Pain 03/01/2018, 10:55 AM   This note was created with Dragon medical transcription system.  Any errors from dictation are unintentional.

## 2018-03-02 ENCOUNTER — Other Ambulatory Visit: Payer: Self-pay | Admitting: *Deleted

## 2018-03-02 ENCOUNTER — Other Ambulatory Visit: Payer: Self-pay | Admitting: Family Medicine

## 2018-03-02 ENCOUNTER — Telehealth (INDEPENDENT_AMBULATORY_CARE_PROVIDER_SITE_OTHER): Payer: Self-pay

## 2018-03-02 DIAGNOSIS — I129 Hypertensive chronic kidney disease with stage 1 through stage 4 chronic kidney disease, or unspecified chronic kidney disease: Secondary | ICD-10-CM | POA: Diagnosis not present

## 2018-03-02 DIAGNOSIS — Z4781 Encounter for orthopedic aftercare following surgical amputation: Secondary | ICD-10-CM | POA: Diagnosis not present

## 2018-03-02 DIAGNOSIS — E1151 Type 2 diabetes mellitus with diabetic peripheral angiopathy without gangrene: Secondary | ICD-10-CM | POA: Diagnosis not present

## 2018-03-02 DIAGNOSIS — I2510 Atherosclerotic heart disease of native coronary artery without angina pectoris: Secondary | ICD-10-CM | POA: Diagnosis not present

## 2018-03-02 DIAGNOSIS — Z89512 Acquired absence of left leg below knee: Secondary | ICD-10-CM | POA: Diagnosis not present

## 2018-03-02 DIAGNOSIS — Z7901 Long term (current) use of anticoagulants: Secondary | ICD-10-CM | POA: Diagnosis not present

## 2018-03-02 DIAGNOSIS — E1122 Type 2 diabetes mellitus with diabetic chronic kidney disease: Secondary | ICD-10-CM | POA: Diagnosis not present

## 2018-03-02 DIAGNOSIS — N183 Chronic kidney disease, stage 3 (moderate): Secondary | ICD-10-CM | POA: Diagnosis not present

## 2018-03-02 DIAGNOSIS — I481 Persistent atrial fibrillation: Secondary | ICD-10-CM | POA: Diagnosis not present

## 2018-03-02 DIAGNOSIS — I4892 Unspecified atrial flutter: Secondary | ICD-10-CM | POA: Diagnosis not present

## 2018-03-02 DIAGNOSIS — G4733 Obstructive sleep apnea (adult) (pediatric): Secondary | ICD-10-CM | POA: Diagnosis not present

## 2018-03-02 MED ORDER — VANCOMYCIN HCL 125 MG PO CAPS: 125.0000 mg | ORAL_CAPSULE | ORAL | 0 refills | Status: DC

## 2018-03-02 NOTE — Telephone Encounter (Signed)
Eric [name] ([facility]) [phone] called for orders to continue nursing visits for the patient and also a prescription for Santyl to put on stump.I spoke KS and she said it was fine and also Annitta Needs has been called into [facility] on [address]

## 2018-03-02 NOTE — Patient Outreach (Signed)
Home visit in lieu of transition of care. Pt sitting and visiting with [facility] RN Evalee Jefferson) She has inspected his wound and is not happy with the "unhealthy looking eschar on the surgical sitel Today was supposed to be his last wound care visit but Verdis Frederickson will be asking for additional visits and to start Santyl to debride the current eschar.   Pt is still getting PT also. He denies falls.  Pt denies pain.  BP (!) 116/50   Pulse 68   Temp (!) 97.4 F (36.3 C)   Resp 16   SpO2 98% FBS 109 RRR Lungs clear LBKA with ace bandaid.  A: Healing LBKA      DM with very good control      No Advanced Directives  P: This will be my final home visit, unless pt calls for a need. I will continue to follow him telephonically for another month.  [facility] CM Care Plan Problem One     Most Recent Value  Care Plan Problem One  New RBKA  (Pended)   Role Documenting the Problem One  Care Management Coordinator  (Pended)   Care Plan for Problem One  Active  (Pended)   THN Long Term Goal   Pt will progress per his report in his adaptation to being an amputee over next 31 days.  (Pended)   THN Long Term Goal Start Date  01/25/18  (Pended)   THN Long Term Goal Met Date  02/23/18  (Pended)   [facility]N CM Short Term Goal #1   Pt will work with PT/OT for optimal and safe mobility over the next 30 days.  (Pended)   THN CM Short Term Goal #1 Start Date  01/25/18  (Pended)   THN CM Short Term Goal #1 Met Date  02/23/18  (Pended)   THN CM Short Term Goal #2   Pt will attend his MD appointments over the next week.  (Pended)   THN CM Short Term Goal #2 Start Date  01/25/18  (Pended)   THN CM Short Term Goal #2 Met Date  02/01/18  (Pended)   THN CM Short Term Goal #3  Pt will do daily wound are as intructed and if any sign of infection is seen he or his wife will call MD for instructions, until the wound is healed (30 days).  (Pended)   THN CM Short Term Goal #3 Start Date  02/16/18  (Pended)     [facility] CM Care  Plan Problem Two     Most Recent Value  Care Plan Problem Two  No Advanced Directives (only has POA)  (Pended)   Role Documenting the Problem Two  Care Management Coordinator  (Pended)   [place] for Problem Two  Active  (Pended)   THN Long Term Goal  Pt will complete HCPOA, Livng will and MOST form by the end of 60 days.  (Pended)   THN Long Term Goal Start Date  02/02/18  (Pended)      DR. PICKARD: PT WAS GIVEN ADVANCED DIRECTIVES BUT IS RELUCTANT TO COMPLETE THEM, PERHAPS YOU CAN DISCUSS THIS WITH HIM.   Eulah Pont. Myrtie Neither, MSN, [facility] Gerontological Nurse Practitioner [facility] Care Management [phone]

## 2018-03-03 DIAGNOSIS — I129 Hypertensive chronic kidney disease with stage 1 through stage 4 chronic kidney disease, or unspecified chronic kidney disease: Secondary | ICD-10-CM | POA: Diagnosis not present

## 2018-03-03 DIAGNOSIS — Z4781 Encounter for orthopedic aftercare following surgical amputation: Secondary | ICD-10-CM | POA: Diagnosis not present

## 2018-03-03 DIAGNOSIS — Z89512 Acquired absence of left leg below knee: Secondary | ICD-10-CM | POA: Diagnosis not present

## 2018-03-03 DIAGNOSIS — Z7901 Long term (current) use of anticoagulants: Secondary | ICD-10-CM | POA: Diagnosis not present

## 2018-03-03 DIAGNOSIS — E1122 Type 2 diabetes mellitus with diabetic chronic kidney disease: Secondary | ICD-10-CM | POA: Diagnosis not present

## 2018-03-07 ENCOUNTER — Telehealth (INDEPENDENT_AMBULATORY_CARE_PROVIDER_SITE_OTHER): Payer: Self-pay

## 2018-03-07 NOTE — Telephone Encounter (Signed)
Patient's wife called stating the patient has yellow oozing coming from his incision with redness.

## 2018-03-07 NOTE — Telephone Encounter (Signed)
Please place him on my schedule.

## 2018-03-08 ENCOUNTER — Ambulatory Visit (INDEPENDENT_AMBULATORY_CARE_PROVIDER_SITE_OTHER): Payer: PPO | Admitting: Vascular Surgery

## 2018-03-08 ENCOUNTER — Encounter (INDEPENDENT_AMBULATORY_CARE_PROVIDER_SITE_OTHER): Payer: Self-pay | Admitting: Vascular Surgery

## 2018-03-08 VITALS — BP 125/71 | HR 72 | Resp 15 | Ht 70.0 in | Wt 155.0 lb

## 2018-03-08 DIAGNOSIS — Z89512 Acquired absence of left leg below knee: Secondary | ICD-10-CM

## 2018-03-08 DIAGNOSIS — I739 Peripheral vascular disease, unspecified: Secondary | ICD-10-CM

## 2018-03-08 NOTE — Progress Notes (Signed)
Subjective:    Patient ID: Eric [name]., male    DOB: February 25, 1928, 84 y.o.   MRN: [id number] Chief Complaint  Patient presents with  . Follow-up    Wound check   Last seen on March 01, 2018 the left stump wound check.  At the time, the wound was healing well and the patient was directed to start wearing his shrinker sock. The patient's wife called the office yesterday stating that his stump was red and draining pus.  The patient was placed on my schedule emergently to be examined today.  The patient was seen with his wife and family member.  The wife states that the patient has started to use the Santyl about 2 days ago and there has been a market improvement in his stump wound.  States that the wound is no longer red or draining.  The patient denies any pain to the stump.  Patient denies any new wound formation.  Patient denies any fever nausea or vomiting.  Review of Systems  Constitutional: Negative.   HENT: Negative.   Eyes: Negative.   Respiratory: Negative.   Cardiovascular: Negative.   Gastrointestinal: Negative.   Endocrine: Negative.   Genitourinary: Negative.   Musculoskeletal: Negative.   Skin: Negative.   Allergic/Immunologic: Negative.   Neurological: Negative.   Hematological: Negative.   Psychiatric/Behavioral: Negative.       Objective:   Physical Exam  Constitutional: He is oriented to person, place, and time. He appears well-developed and well-nourished. No distress.  HENT:  Head: Normocephalic and atraumatic.  Right Ear: External ear normal.  Left Ear: External ear normal.  Eyes: Pupils are equal, round, and reactive to light. Conjunctivae and EOM are normal.  Neck: Normal range of motion.  Cardiovascular: Normal rate, regular rhythm and normal heart sounds.  Pulmonary/Chest: Effort normal and breath sounds normal.  Musculoskeletal: Normal range of motion. He exhibits edema (Wound right lower extremity edema noted.).  Neurological: He is alert and  oriented to person, place, and time.  Skin: He is not diaphoretic.  Left below the knee amputation stump: 3 cm x 3 cm circular wound towards the lateral aspect of the incision.  The wound bed is 100% fibrinous exudate.  I gently debrided this area.  There is no necrotic tissue.  There is no surrounding erythema.  There is no cellulitis.  There is no drainage.  There are no signs of infection.  Psychiatric: He has a normal mood and affect. His behavior is normal. Judgment and thought content normal.  Vitals reviewed.  BP 125/71 (BP Location: Right Arm, Patient Position: Sitting)   Pulse 72   Resp 15   Ht _0  (1.778 m)   Wt 155 lb (70.3 kg)   BMI 22.24 kg/m   Past Medical History:  Diagnosis Date  . Atrial flutter ([place])    s/p RFCA  . CAD (coronary artery disease)    cath 2003, occluded S-RCA, occluded S-Dx, L-LAD ok, s/p PTCA to LAD  . Cataract   . CKD (chronic kidney disease) stage 3, GFR 30-59 ml/min (HCC)   . Diabetes mellitus    diet controlled  . Hearing aid worn    bilateral  . History of kidney stones   . Long term (current) use of anticoagulants   . Neuromuscular disorder ([place])   . OSA (obstructive sleep apnea) 12/11   very mild, AHI 7/hr  . Persistent atrial fibrillation ([place]) 09/26/2015  . Pure hyperglyceridemia   . PVD (peripheral vascular  disease) [facility])    angioplasty of his right lower extremity in [place] by Dr.[name] 2013  . Unspecified essential hypertension   . Wears dentures    full upper and lower   Social History   Socioeconomic History  . Marital status: Married    Spouse name: Not on file  . Number of children: 5  . Years of education: Not on file  . Highest education level: Not on file  Occupational History  . Occupation: [employer]    Employer: RETIRED  Social Needs  . Financial resource strain: Not on file  . Food insecurity:    Worry: Not on file    Inability: Not on file  . Transportation needs:    Medical: Not on file     Non-medical: Not on file  Tobacco Use  . Smoking status: Never Smoker  . Smokeless tobacco: Former Network engineer and Sexual Activity  . Alcohol use: No  . Drug use: No  . Sexual activity: Never  Lifestyle  . Physical activity:    Days per week: Not on file    Minutes per session: Not on file  . Stress: Not on file  Relationships  . Social connections:    Talks on phone: Not on file    Gets together: Not on file    Attends religious service: Not on file    Active member of club or organization: Not on file    Attends meetings of clubs or organizations: Not on file    Relationship status: Not on file  . Intimate partner violence:    Fear of current or ex partner: Not on file    Emotionally abused: Not on file    Physically abused: Not on file    Forced sexual activity: Not on file  Other Topics Concern  . Not on file  Social History Narrative   Retired now does some antiques   Retired from Theatre manager and dye work   Married 1951   5 kids   Past Surgical History:  Procedure Laterality Date  . ABDOMINAL AORTOGRAM W/LOWER EXTREMITY N/A 11/15/2017   Procedure: ABDOMINAL AORTOGRAM W/LOWER EXTREMITY;  Surgeon: [name], MD;  Location: [place] [facility];  Service: Cardiovascular;  Laterality: N/A;  . Adenosine Myoview  3/06   EF 56%, neg. Ischemia  . Adenosine Myoview  02/18/07   nml  . AMPUTATION Left 12/15/2017   Procedure: AMPUTATION BELOW KNEE;  Surgeon: Algernon [name], MD;  Location: [facility] ORS;  Service: Vascular;  Laterality: Left;  . ANGIOPLASTY  1/99   CAD- diogonal with rotational artherectomy  . Arthrectomy     of LAD & PTCA  . BLEPHAROPLASTY Bilateral   . CARDIAC CATHETERIZATION  1/00  . CARDIOVERSION  1/04  . CARDIOVERSION  5/07   hospital- a flutter  . CARPAL TUNNEL RELEASE     ? bilateral  . CATARACT EXTRACTION W/PHACO Right 07/28/2017   Procedure: CATARACT EXTRACTION PHACO AND INTRAOCULAR LENS PLACEMENT ([place]) RIGHT DIABETIC;  Surgeon: [name],  MD;  Location: [place];  Service: Ophthalmology;  Laterality: Right;  Diabetic - diet controlled  . CATARACT EXTRACTION W/PHACO Left 08/18/2017   Procedure: CATARACT EXTRACTION PHACO AND INTRAOCULAR LENS PLACEMENT (IOC);  Surgeon: [name], MD;  Location: [place];  Service: Ophthalmology;  Laterality: Left;  DIABETES - oral meds  . COLONOSCOPY W/ BIOPSIES  10/01/06   sigmoid polyp bx neg, 3 years  . CORONARY ANGIOPLASTY  4/03  cutting balloon PTCA pLAD into Diag  . CORONARY ARTERY BYPASS GRAFT  2000   LIMA-LAD, SVG-RCA, SVG-Diag; SVG-Diag & SVG-RCA occluded 2003  . FRACTURE SURGERY    . HAND SURGERY  08/27/09   R thumb procedure wit Scaphoid Gragt and screws, Dr [name]  . NM MYOVIEW LTD  4/11   normal  . PERIPHERAL VASCULAR CATHETERIZATION Right 06/27/2015   Procedure: Lower Extremity Angiography;  Surgeon: Algernon [name], MD;  Location: [place] [facility];  Service: Cardiovascular;  Laterality: Right;  . PERIPHERAL VASCULAR CATHETERIZATION  06/27/2015   Procedure: Lower Extremity Intervention;  Surgeon: Algernon [name], MD;  Location: [place] [facility];  Service: Cardiovascular;;   Family History  Problem Relation Age of Onset  . Stroke Father   . Aneurysm Father   . Hypertension Father   . Prostate cancer Brother   . Hypertension Mother   . Lung cancer Brother        smoker  . Thrombosis Brother   . Other Brother        RF valve disorder (smoker)  . Lung cancer Brother        smoker  . Breast cancer Sister   . Liver cancer Brother   . Other Sister        cerebral hemorrhage   Allergies  Allergen Reactions  . Isosorbide Mononitrate Other (See Comments)    Unknown, doesn't remember   . Ramipril Cough  . Quinidine Gluconate Rash      Assessment & Plan:  Last seen on March 01, 2018 the left stump wound check.  At the time, the wound was healing well and the patient was directed to start wearing his shrinker sock. The patient's wife called the  office yesterday stating that his stump was red and draining pus.  The patient was placed on my schedule emergently to be examined today.  The patient was seen with his wife and family member.  The wife states that the patient has started to use the Santyl about 2 days ago and there has been a market improvement in his stump wound.  States that the wound is no longer red or draining.  The patient denies any pain to the stump.  Patient denies any new wound formation.  Patient denies any fever nausea or vomiting.  1. Hx of BKA, left ([place]) - Stable Gently debrided the wound located towards the lateral aspect of the patient's left below the knee amputation stump. The wound bed is 100% fibrinous exudate.  I gently debrided this area as far down as I could without hurting the patient. The patient is to continue applying Santyl to the area daily. I will see the patient back in 1 week to assess his progress with Santyl If the wound changes and there is increased pain, redness, draining or foul odor the patient is to call the office sooner  2. PVD (peripheral vascular disease) ([place]) - Stable Patient presents without symptoms to the right lower extremity This is checked on a regular basis  Current Outpatient Medications on File Prior to Visit  Medication Sig Dispense Refill  . acetaminophen (TYLENOL) 325 MG tablet Take 2 tablets (650 mg total) by mouth every 6 (six) hours as needed for mild pain (or temp >/= 101 F).    [name] amLODipine (NORVASC) 10 MG tablet     . apixaban (ELIQUIS) 2.5 MG TABS tablet Take 1 tablet (2.5 mg total) by mouth 2 (two) times daily. 180 tablet 1  .  B Complex Vitamins (B COMPLEX-B12) TABS Take 1 tablet by mouth daily.     . Blood Glucose Monitoring Suppl (ONE TOUCH ULTRA SYSTEM KIT) W/DEVICE KIT 1 kit by Does not apply route once. 1 each 0  . calcium carbonate (TUMS - DOSED IN MG ELEMENTAL CALCIUM) 500 MG chewable tablet Chew 1 tablet by mouth daily as needed for indigestion or  heartburn.    . cholecalciferol (VITAMIN D) 1000 UNITS tablet Take 1,000 Units by mouth 2 (two) times a week. Takes on Mon and Fri    . Cinnamon 500 MG capsule Take 1,000 mg by mouth 2 (two) times daily.     . feeding supplement, GLUCERNA SHAKE, (GLUCERNA SHAKE) LIQD Take 237 mLs by mouth daily.    . fenofibrate 160 MG tablet Take 1 tablet (160 mg total) by mouth daily. 90 tablet 2  . ferrous sulfate 325 (65 FE) MG EC tablet Take 325 mg by mouth 3 (three) times daily with meals.    . finasteride (PROSCAR) 5 MG tablet Take 1 tablet (5 mg total) by mouth daily. 30 tablet 0  . fish oil-omega-3 fatty acids 1000 MG capsule Take 1,000 mg by mouth daily.     [name] glucose blood test strip Use as instructed 100 each 3  . lisinopril (PRINIVIL,ZESTRIL) 20 MG tablet Take 1 tablet (20 mg total) by mouth daily. 30 tablet 0  . metoprolol tartrate (LOPRESSOR) 25 MG tablet Take 0.5 tablets (12.5 mg total) by mouth 2 (two) times daily. 30 tablet 0  . Multiple Vitamin (DAILY MULTIVITAMIN PO) Take 1 tablet by mouth daily.      . ondansetron (ZOFRAN) 4 MG tablet Take 4 mg by mouth every 6 (six) hours as needed for nausea or vomiting.    [name] DELICA LANCETS FINE MISC check sugar once daily 100 each 2  . pentoxifylline (TRENTAL) 400 MG CR tablet Take 1 tablet (400 mg total) by mouth 3 (three) times daily with meals. 90 tablet 3  . rosuvastatin (CRESTOR) 40 MG tablet Take 1 tablet (40 mg total) by mouth daily. 90 tablet 2  . saccharomyces boulardii (FLORASTOR) 250 MG capsule Take 1 capsule (250 mg total) by mouth daily. 90 capsule 3  . SANTYL ointment APPLY DAILY TO WOUND AS DIRECTED  5  . silver sulfADIAZINE (SILVADENE) 1 % cream Apply 1 application topically daily. Place to left lateral staple incision also place to right lateral foot    . tamsulosin (FLOMAX) 0.4 MG CAPS capsule Take 1 capsule (0.4 mg total) by mouth daily. 90 capsule 3  . vancomycin (VANCOCIN) 125 MG capsule Take 1 capsule (125 mg total) by mouth  as directed. 52 capsule 0   No current facility-administered medications on file prior to visit.    There are no Patient Instructions on file for this visit. No follow-ups on file.  Kayde Atkerson A Latonyia Lopata, PA-C

## 2018-03-10 LAB — GASTROINTESTINAL PATHOGEN PANEL PCR

## 2018-03-11 ENCOUNTER — Encounter: Payer: Self-pay | Admitting: Family Medicine

## 2018-03-11 MED ORDER — METOPROLOL TARTRATE 25 MG PO TABS: 12.5000 mg | ORAL_TABLET | Freq: Two times a day (BID) | ORAL | 3 refills | Status: DC

## 2018-03-11 MED ORDER — FENOFIBRATE 160 MG PO TABS: 160.0000 mg | ORAL_TABLET | Freq: Every day | ORAL | 3 refills | Status: DC

## 2018-03-14 DIAGNOSIS — E1122 Type 2 diabetes mellitus with diabetic chronic kidney disease: Secondary | ICD-10-CM | POA: Diagnosis not present

## 2018-03-14 DIAGNOSIS — Z4781 Encounter for orthopedic aftercare following surgical amputation: Secondary | ICD-10-CM | POA: Diagnosis not present

## 2018-03-14 DIAGNOSIS — Z7901 Long term (current) use of anticoagulants: Secondary | ICD-10-CM | POA: Diagnosis not present

## 2018-03-14 DIAGNOSIS — Z89512 Acquired absence of left leg below knee: Secondary | ICD-10-CM | POA: Diagnosis not present

## 2018-03-14 DIAGNOSIS — I129 Hypertensive chronic kidney disease with stage 1 through stage 4 chronic kidney disease, or unspecified chronic kidney disease: Secondary | ICD-10-CM | POA: Diagnosis not present

## 2018-03-15 ENCOUNTER — Ambulatory Visit (INDEPENDENT_AMBULATORY_CARE_PROVIDER_SITE_OTHER): Payer: PPO | Admitting: Vascular Surgery

## 2018-03-15 ENCOUNTER — Encounter: Payer: Self-pay | Admitting: Family Medicine

## 2018-03-15 ENCOUNTER — Encounter (INDEPENDENT_AMBULATORY_CARE_PROVIDER_SITE_OTHER): Payer: Self-pay | Admitting: Vascular Surgery

## 2018-03-15 VITALS — BP 124/62 | HR 54 | Resp 16

## 2018-03-15 DIAGNOSIS — Z89512 Acquired absence of left leg below knee: Secondary | ICD-10-CM

## 2018-03-15 DIAGNOSIS — I10 Essential (primary) hypertension: Secondary | ICD-10-CM

## 2018-03-15 NOTE — Progress Notes (Signed)
Patient ID: Eric Janoski., male   DOB: 25-Jul-1928, 84 y.o.   MRN: [id number]  Chief Complaint  Patient presents with  . Wound Check    HPI Eric Farooqui. is a 84 y.o. male.  Patient returns in follow-up for a wound check.  He has about a centimeter opening in the midportion and about a centimeter scab medial and laterally. No erythema.  Minimal [name].   Past Medical History:  Diagnosis Date  . Atrial flutter ([place])    s/p RFCA  . CAD (coronary artery disease)    cath 2003, occluded S-RCA, occluded S-Dx, L-LAD ok, s/p PTCA to LAD  . Cataract   . CKD (chronic kidney disease) stage 3, GFR 30-59 ml/min (HCC)   . Diabetes mellitus    diet controlled  . Hearing aid worn    bilateral  . History of kidney stones   . Long term (current) use of anticoagulants   . Neuromuscular disorder ([place])   . OSA (obstructive sleep apnea) 12/11   very mild, AHI 7/hr  . Persistent atrial fibrillation ([place]) 09/26/2015  . Pure hyperglyceridemia   . PVD (peripheral vascular disease) ([place])    angioplasty of his right lower extremity in [place] by Dr.Billyjoe Go 2013  . Unspecified essential hypertension   . Wears dentures    full upper and lower    Past Surgical History:  Procedure Laterality Date  . ABDOMINAL AORTOGRAM W/LOWER EXTREMITY N/A 11/15/2017   Procedure: ABDOMINAL AORTOGRAM W/LOWER EXTREMITY;  Surgeon: [name], MD;  Location: [place] [facility];  Service: Cardiovascular;  Laterality: N/A;  . Adenosine Myoview  3/06   EF 56%, neg. Ischemia  . Adenosine Myoview  02/18/07   nml  . AMPUTATION Left 12/15/2017   Procedure: AMPUTATION BELOW KNEE;  Surgeon: Algernon [name], MD;  Location: [facility];  Service: Vascular;  Laterality: Left;  . ANGIOPLASTY  1/99   CAD- diogonal with rotational artherectomy  . Arthrectomy     of LAD & PTCA  . BLEPHAROPLASTY Bilateral   . CARDIAC CATHETERIZATION  1/00  . CARDIOVERSION  1/04  . CARDIOVERSION  5/07   hospital- a flutter  . CARPAL TUNNEL  RELEASE     ? bilateral  . CATARACT EXTRACTION W/PHACO Right 07/28/2017   Procedure: CATARACT EXTRACTION PHACO AND INTRAOCULAR LENS PLACEMENT ([place]) RIGHT DIABETIC;  Surgeon: [name], MD;  Location: [place];  Service: Ophthalmology;  Laterality: Right;  Diabetic - diet controlled  . CATARACT EXTRACTION W/PHACO Left 08/18/2017   Procedure: CATARACT EXTRACTION PHACO AND INTRAOCULAR LENS PLACEMENT ([facility]);  Surgeon: [name], MD;  Location: [place];  Service: Ophthalmology;  Laterality: Left;  DIABETES - oral meds  . COLONOSCOPY W/ BIOPSIES  10/01/06   sigmoid polyp bx neg, 3 years  . CORONARY ANGIOPLASTY  4/03   cutting balloon PTCA pLAD into Diag  . CORONARY ARTERY BYPASS GRAFT  2000   LIMA-LAD, SVG-RCA, SVG-Diag; SVG-Diag & SVG-RCA occluded 2003  . FRACTURE SURGERY    . HAND SURGERY  08/27/09   R thumb procedure wit Scaphoid Gragt and screws, Dr [name]  . NM MYOVIEW LTD  4/11   normal  . PERIPHERAL VASCULAR CATHETERIZATION Right 06/27/2015   Procedure: Lower Extremity Angiography;  Surgeon: Algernon [name], MD;  Location: [place] [facility];  Service: Cardiovascular;  Laterality: Right;  . PERIPHERAL VASCULAR CATHETERIZATION  06/27/2015   Procedure: Lower Extremity Intervention;  Surgeon: Algernon [name], MD;  Location: [facility]  INVASIVE CV LAB;  Service: Cardiovascular;;      Allergies  Allergen Reactions  . Isosorbide Mononitrate Other (See Comments)    Unknown, doesn't remember   . Ramipril Cough  . Quinidine Gluconate Rash    Current Outpatient Medications  Medication Sig Dispense Refill  . acetaminophen (TYLENOL) 325 MG tablet Take 2 tablets (650 mg total) by mouth every 6 (six) hours as needed for mild [name] (or temp >/= 101 F).    [name] amLODipine (NORVASC) 10 MG tablet     . apixaban (ELIQUIS) 2.5 MG TABS tablet Take 1 tablet (2.5 mg total) by mouth 2 (two) times daily. 180 tablet 1  . B Complex Vitamins (B COMPLEX-B12) TABS Take 1 tablet by  mouth daily.     . Blood Glucose Monitoring Suppl (ONE TOUCH ULTRA SYSTEM KIT) W/DEVICE KIT 1 kit by Does not apply route once. 1 each 0  . calcium carbonate (TUMS - DOSED IN MG ELEMENTAL CALCIUM) 500 MG chewable tablet Chew 1 tablet by mouth daily as needed for indigestion or heartburn.    . cholecalciferol (VITAMIN D) 1000 UNITS tablet Take 1,000 Units by mouth 2 (two) times a week. Takes on Mon and Fri    . Cinnamon 500 MG capsule Take 1,000 mg by mouth 2 (two) times daily.     . feeding supplement, GLUCERNA SHAKE, (GLUCERNA SHAKE) LIQD Take 237 mLs by mouth daily.    . fenofibrate 160 MG tablet Take 1 tablet (160 mg total) by mouth daily. 90 tablet 3  . ferrous sulfate 325 (65 FE) MG EC tablet Take 325 mg by mouth 3 (three) times daily with meals.    . finasteride (PROSCAR) 5 MG tablet Take 1 tablet (5 mg total) by mouth daily. 30 tablet 0  . fish oil-omega-3 fatty acids 1000 MG capsule Take 1,000 mg by mouth daily.     [name] glucose blood test strip Use as instructed 100 each 3  . lisinopril (PRINIVIL,ZESTRIL) 20 MG tablet Take 1 tablet (20 mg total) by mouth daily. 30 tablet 0  . metoprolol tartrate (LOPRESSOR) 25 MG tablet Take 0.5 tablets (12.5 mg total) by mouth 2 (two) times daily. 90 tablet 3  . Multiple Vitamin (DAILY MULTIVITAMIN PO) Take 1 tablet by mouth daily.      . ondansetron (ZOFRAN) 4 MG tablet Take 4 mg by mouth every 6 (six) hours as needed for nausea or vomiting.    [name] DELICA LANCETS FINE MISC check sugar once daily 100 each 2  . pentoxifylline (TRENTAL) 400 MG CR tablet Take 1 tablet (400 mg total) by mouth 3 (three) times daily with meals. 90 tablet 3  . rosuvastatin (CRESTOR) 40 MG tablet Take 1 tablet (40 mg total) by mouth daily. 90 tablet 2  . saccharomyces boulardii (FLORASTOR) 250 MG capsule Take 1 capsule (250 mg total) by mouth daily. 90 capsule 3  . SANTYL ointment APPLY DAILY TO WOUND AS DIRECTED  5  . silver sulfADIAZINE (SILVADENE) 1 % cream Apply 1  application topically daily. Place to left lateral staple incision also place to right lateral foot    . tamsulosin (FLOMAX) 0.4 MG CAPS capsule Take 1 capsule (0.4 mg total) by mouth daily. 90 capsule 3  . vancomycin (VANCOCIN) 125 MG capsule Take 1 capsule (125 mg total) by mouth as directed. 52 capsule 0   No current facility-administered medications for this visit.         Physical Exam BP 124/62 (BP Location: Right Arm)  Pulse (!) 54   Resp 16  Gen:  WD/WN, NAD Skin: incision of left BKA as above, improving     Assessment/Plan:  Essential hypertension blood pressure control important in reducing the progression of atherosclerotic disease. On appropriate oral medications.   Hx of BKA, left ([place]) At this point, we should go ahead and get him in a stump shrinker with the hope of him being able to get his prosthesis a few weeks later.  His stump is doing reasonably well and these areas should heal in over the next few weeks.  I will see him back in 3 months to check the ABIs on his right leg      Eric [name] 03/15/2018, 3:01 PM   This note was created with Dragon medical transcription system.  Any errors from dictation are unintentional.

## 2018-03-15 NOTE — Assessment & Plan Note (Signed)
At this point, we should go ahead and get him in a stump shrinker with the hope of him being able to get his prosthesis a few weeks later.  His stump is doing reasonably well and these areas should heal in over the next few weeks.  I will see him back in 3 months to check the ABIs on his right leg

## 2018-03-15 NOTE — Assessment & Plan Note (Signed)
blood pressure control important in reducing the progression of atherosclerotic disease. On appropriate oral medications.  

## 2018-03-16 ENCOUNTER — Ambulatory Visit: Payer: Self-pay | Admitting: *Deleted

## 2018-03-16 ENCOUNTER — Encounter: Payer: Self-pay | Admitting: Family Medicine

## 2018-03-17 DIAGNOSIS — E11621 Type 2 diabetes mellitus with foot ulcer: Secondary | ICD-10-CM | POA: Diagnosis not present

## 2018-03-17 DIAGNOSIS — S88112D Complete traumatic amputation at level between knee and ankle, left lower leg, subsequent encounter: Secondary | ICD-10-CM | POA: Diagnosis not present

## 2018-03-22 ENCOUNTER — Other Ambulatory Visit: Payer: PPO

## 2018-03-22 DIAGNOSIS — E0940 Drug or chemical induced diabetes mellitus with neurological complications with diabetic neuropathy, unspecified: Secondary | ICD-10-CM

## 2018-03-22 DIAGNOSIS — Z79899 Other long term (current) drug therapy: Secondary | ICD-10-CM | POA: Diagnosis not present

## 2018-03-22 DIAGNOSIS — E781 Pure hyperglyceridemia: Secondary | ICD-10-CM | POA: Diagnosis not present

## 2018-03-23 LAB — HEMOGLOBIN A1C
EAG (MMOL/L): 6 (calc)
HEMOGLOBIN A1C: 5.4 %{Hb} (ref <5.7)
Mean Plasma Glucose: 108 (calc)

## 2018-03-23 LAB — LIPID PANEL
Cholesterol: 111 mg/dL (ref <200)
HDL: 28 mg/dL — ABNORMAL LOW (ref >40)
LDL Cholesterol (Calc): 62 mg/dL (calc)
NON-HDL CHOLESTEROL (CALC): 83 mg/dL (ref <130)
TRIGLYCERIDES: 124 mg/dL (ref <150)
Total CHOL/HDL Ratio: 4 (calc) (ref <5.0)

## 2018-03-26 ENCOUNTER — Other Ambulatory Visit: Payer: Self-pay | Admitting: Family Medicine

## 2018-03-26 DIAGNOSIS — S88112D Complete traumatic amputation at level between knee and ankle, left lower leg, subsequent encounter: Secondary | ICD-10-CM | POA: Diagnosis not present

## 2018-03-28 DIAGNOSIS — S88112D Complete traumatic amputation at level between knee and ankle, left lower leg, subsequent encounter: Secondary | ICD-10-CM | POA: Diagnosis not present

## 2018-03-28 DIAGNOSIS — E11621 Type 2 diabetes mellitus with foot ulcer: Secondary | ICD-10-CM | POA: Diagnosis not present

## 2018-03-29 ENCOUNTER — Encounter: Payer: Self-pay | Admitting: Family Medicine

## 2018-03-29 DIAGNOSIS — H353131 Nonexudative age-related macular degeneration, bilateral, early dry stage: Secondary | ICD-10-CM | POA: Diagnosis not present

## 2018-03-29 LAB — HM DIABETES EYE EXAM

## 2018-03-30 ENCOUNTER — Ambulatory Visit: Payer: Self-pay | Admitting: *Deleted

## 2018-04-07 ENCOUNTER — Encounter: Payer: Self-pay | Admitting: *Deleted

## 2018-04-12 ENCOUNTER — Other Ambulatory Visit: Payer: Self-pay | Admitting: *Deleted

## 2018-04-12 NOTE — Patient Outreach (Addendum)
Case Closure phone call. Called left message and requested a return phone call.  Eulah Pont. Myrtie Neither, MSN, GNP-BC Gerontological Nurse Practitioner [place] [facility] [phone]  Called again at 10:45 am, still no answer, left another message to return my call.  Eulah Pont. Myrtie Neither, MSN, GNP-BC Gerontological Nurse Practitioner [facility] Care Management [phone]  Pt returned my call. He reports he is doing very well. He still has home health checking on his leg wound occasionally but mostly he is taking care of his post op RBKA site himself. He is wearing a shrinker now and it to get a tighter one next week and then will start working on getting his leg prosthesis.  He denies any further care management needs and expressed gratitude for our services.  I have advised him I am closing his case. I also advised that I would be available to him if he needs anything in the future.  THN CM Care Plan Problem One     Most Recent Value  Care Plan Problem One  New RBKA  Role Documenting the Problem One  Care Management Coordinator  Care Plan for Problem One  Active  THN Long Term Goal   Pt will progress per his report in his adaptation to being an amputee over next 31 days.  THN Long Term Goal Start Date  01/25/18  THN Long Term Goal Met Date  02/23/18  THN CM Short Term Goal #1   Pt will work with PT/OT for optimal and safe mobility over the next 30 days.  THN CM Short Term Goal #1 Start Date  01/25/18  THN CM Short Term Goal #1 Met Date  02/23/18  THN CM Short Term Goal #2   Pt will attend his MD appointments over the next week.  THN CM Short Term Goal #2 Start Date  01/25/18  THN CM Short Term Goal #2 Met Date  02/01/18  THN CM Short Term Goal #3  Pt will do daily wound are as intructed and if any sign of infection is seen he or his wife will call MD for instructions, until the wound is healed (30 days).  THN CM Short Term Goal #3 Start Date  02/16/18  [facility] CM Short Term Goal #3 Met Date   04/13/18  Interventions for Short Tern Goal #3  Praised for his aptitude for caring for himself.    THN CM Care Plan Problem Two     Most Recent Value  Care Plan Problem Two  No Advanced Directives (only has POA)  Role Documenting the Problem Two  Care Management [place] for Problem Two  Active  THN Long Term Goal  Pt will complete HCPOA, Livng will and MOST form by the end of 60 days.  THN Long Term Goal Start Date  02/02/18  [facility] Long Term Goal Met Date  -- [Pt does not wish to complete Advanced Directives.]      Viyaan Champine C. Myrtie Neither, MSN, [facility] Gerontological Nurse Practitioner [facility] Care Management [phone]

## 2018-04-13 ENCOUNTER — Encounter: Payer: Self-pay | Admitting: *Deleted

## 2018-04-26 DIAGNOSIS — S88112D Complete traumatic amputation at level between knee and ankle, left lower leg, subsequent encounter: Secondary | ICD-10-CM | POA: Diagnosis not present

## 2018-04-28 ENCOUNTER — Encounter: Payer: Self-pay | Admitting: Cardiology

## 2018-04-28 ENCOUNTER — Ambulatory Visit: Payer: PPO | Admitting: Cardiology

## 2018-04-28 VITALS — BP 128/78 | HR 55 | Ht 65.5 in | Wt 164.0 lb

## 2018-04-28 DIAGNOSIS — I10 Essential (primary) hypertension: Secondary | ICD-10-CM

## 2018-04-28 DIAGNOSIS — E782 Mixed hyperlipidemia: Secondary | ICD-10-CM

## 2018-04-28 DIAGNOSIS — I4891 Unspecified atrial fibrillation: Secondary | ICD-10-CM | POA: Diagnosis not present

## 2018-04-28 DIAGNOSIS — S88112D Complete traumatic amputation at level between knee and ankle, left lower leg, subsequent encounter: Secondary | ICD-10-CM | POA: Diagnosis not present

## 2018-04-28 DIAGNOSIS — I2510 Atherosclerotic heart disease of native coronary artery without angina pectoris: Secondary | ICD-10-CM

## 2018-04-28 DIAGNOSIS — E11621 Type 2 diabetes mellitus with foot ulcer: Secondary | ICD-10-CM | POA: Diagnosis not present

## 2018-04-28 NOTE — Patient Instructions (Signed)

## 2018-04-28 NOTE — Progress Notes (Signed)
Clinical Summary [name] is a 84 y.o.male previously followed by Dr [name], this is our first visit together.  1. CAD  - CABG 2000 w/ LIMA-LAD, SVG-Diag, SVG-PDA. Subsequent PTCA of LAD in 2003 - no recent chest pain. No recent SOB/DOE - compliant with meds  2. Permanent afib - no recent palpitations - no bleeding on eliquis.   3. CKD III - followed by nephrology at [facility]  4. DM2 - followed by pcp   5. PAD - history of prior intervention - recent BKA  6. HTN - notes mention some prior orthostatic symptoms. - tolerating current medication regimen   7. Hyperlipidemia - 03/2018 TC 111 HDL 28 TG 124 LDL 62 - compliant with statin  Past Medical History:  Diagnosis Date  . Atrial flutter ([name])    s/p RFCA  . CAD (coronary artery disease)    cath 2003, occluded S-RCA, occluded S-Dx, L-LAD ok, s/p PTCA to LAD  . Cataract   . CKD (chronic kidney disease) stage 3, GFR 30-59 ml/min (HCC)   . Diabetes mellitus    diet controlled  . Hearing aid worn    bilateral  . History of kidney stones   . Long term (current) use of anticoagulants   . Neuromuscular disorder ([name])   . OSA (obstructive sleep apnea) 12/11   very mild, AHI 7/hr  . Persistent atrial fibrillation ([name]) 09/26/2015  . Pure hyperglyceridemia   . PVD (peripheral vascular disease) ([place])    angioplasty of his right lower extremity in [name] by Dr.[name] 2013  . Unspecified essential hypertension   . Wears dentures    full upper and lower     Allergies  Allergen Reactions  . Isosorbide Mononitrate Other (See Comments)    Unknown, doesn't remember   . Ramipril Cough  . Quinidine Gluconate Rash     Current Outpatient Medications  Medication Sig Dispense Refill  . acetaminophen (TYLENOL) 325 MG tablet Take 2 tablets (650 mg total) by mouth every 6 (six) hours as needed for mild pain (or temp >/= 101 F).    [name] amLODipine (NORVASC) 10 MG tablet     . apixaban (ELIQUIS) 2.5 MG TABS  tablet Take 1 tablet (2.5 mg total) by mouth 2 (two) times daily. 180 tablet 1  . B Complex Vitamins (B COMPLEX-B12) TABS Take 1 tablet by mouth daily.     . Blood Glucose Monitoring Suppl (ONE TOUCH ULTRA SYSTEM KIT) W/DEVICE KIT 1 kit by Does not apply route once. 1 each 0  . calcium carbonate (TUMS - DOSED IN MG ELEMENTAL CALCIUM) 500 MG chewable tablet Chew 1 tablet by mouth daily as needed for indigestion or heartburn.    . cholecalciferol (VITAMIN D) 1000 UNITS tablet Take 1,000 Units by mouth 2 (two) times a week. Takes on Mon and Fri    . Cinnamon 500 MG capsule Take 1,000 mg by mouth 2 (two) times daily.     . feeding supplement, GLUCERNA SHAKE, (GLUCERNA SHAKE) LIQD Take 237 mLs by mouth daily.    . fenofibrate 160 MG tablet Take 1 tablet (160 mg total) by mouth daily. 90 tablet 3  . ferrous sulfate 325 (65 FE) MG EC tablet Take 325 mg by mouth 3 (three) times daily with meals.    . finasteride (PROSCAR) 5 MG tablet TAKE 1 TABLET BY MOUTH ONCE DAILY 90 tablet 3  . fish oil-omega-3 fatty acids 1000 MG capsule Take 1,000 mg by mouth daily.     [name]  glucose blood test strip Use as instructed 100 each 3  . lisinopril (PRINIVIL,ZESTRIL) 20 MG tablet Take 1 tablet (20 mg total) by mouth daily. 30 tablet 0  . metoprolol tartrate (LOPRESSOR) 25 MG tablet Take 0.5 tablets (12.5 mg total) by mouth 2 (two) times daily. 90 tablet 3  . Multiple Vitamin (DAILY MULTIVITAMIN PO) Take 1 tablet by mouth daily.      . ondansetron (ZOFRAN) 4 MG tablet Take 4 mg by mouth every 6 (six) hours as needed for nausea or vomiting.    [name] DELICA LANCETS FINE MISC check sugar once daily 100 each 2  . pentoxifylline (TRENTAL) 400 MG CR tablet Take 1 tablet (400 mg total) by mouth 3 (three) times daily with meals. 90 tablet 3  . rosuvastatin (CRESTOR) 40 MG tablet Take 1 tablet (40 mg total) by mouth daily. 90 tablet 2  . saccharomyces boulardii (FLORASTOR) 250 MG capsule Take 1 capsule (250 mg total) by mouth  daily. 90 capsule 3  . SANTYL ointment APPLY DAILY TO WOUND AS DIRECTED  5  . silver sulfADIAZINE (SILVADENE) 1 % cream Apply 1 application topically daily. Place to left lateral staple incision also place to right lateral foot    . tamsulosin (FLOMAX) 0.4 MG CAPS capsule Take 1 capsule (0.4 mg total) by mouth daily. 90 capsule 3  . vancomycin (VANCOCIN) 125 MG capsule Take 1 capsule (125 mg total) by mouth as directed. 52 capsule 0   No current facility-administered medications for this visit.      Past Surgical History:  Procedure Laterality Date  . ABDOMINAL AORTOGRAM W/LOWER EXTREMITY N/A 11/15/2017   Procedure: ABDOMINAL AORTOGRAM W/LOWER EXTREMITY;  Surgeon: Eric Dutch, MD;  Location: [name];  Service: Cardiovascular;  Laterality: N/A;  . Adenosine Myoview  3/06   EF 56%, neg. Ischemia  . Adenosine Myoview  02/18/07   nml  . AMPUTATION Left 12/15/2017   Procedure: AMPUTATION BELOW KNEE;  Surgeon: Eric [name], MD;  Location: [name];  Service: Vascular;  Laterality: Left;  . ANGIOPLASTY  1/99   CAD- diogonal with rotational artherectomy  . Arthrectomy     of LAD & PTCA  . BLEPHAROPLASTY Bilateral   . CARDIAC CATHETERIZATION  1/00  . CARDIOVERSION  1/04  . CARDIOVERSION  5/07   hospital- a flutter  . CARPAL TUNNEL RELEASE     ? bilateral  . CATARACT EXTRACTION W/PHACO Right 07/28/2017   Procedure: CATARACT EXTRACTION PHACO AND INTRAOCULAR LENS PLACEMENT ([name]) RIGHT DIABETIC;  Surgeon: Eric Koyanagi, MD;  Location: [name];  Service: Ophthalmology;  Laterality: Right;  Diabetic - diet controlled  . CATARACT EXTRACTION W/PHACO Left 08/18/2017   Procedure: CATARACT EXTRACTION PHACO AND INTRAOCULAR LENS PLACEMENT ([facility]);  Surgeon: Eric Koyanagi, MD;  Location: [name];  Service: Ophthalmology;  Laterality: Left;  DIABETES - oral meds  . COLONOSCOPY W/ BIOPSIES  10/01/06   sigmoid polyp bx neg, 3 years  . CORONARY ANGIOPLASTY  4/03    cutting balloon PTCA pLAD into Diag  . CORONARY ARTERY BYPASS GRAFT  2000   LIMA-LAD, SVG-RCA, SVG-Diag; SVG-Diag & SVG-RCA occluded 2003  . FRACTURE SURGERY    . HAND SURGERY  08/27/09   R thumb procedure wit Scaphoid Gragt and screws, Dr [name]  . NM MYOVIEW LTD  4/11   normal  . PERIPHERAL VASCULAR CATHETERIZATION Right 06/27/2015   Procedure: Lower Extremity Angiography;  Surgeon: Eric [name], MD;  Location: [facility];  Service: Cardiovascular;  Laterality: Right;  . PERIPHERAL VASCULAR CATHETERIZATION  06/27/2015   Procedure: Lower Extremity Intervention;  Surgeon: Eric [name], MD;  Location: [facility];  Service: Cardiovascular;;     Allergies  Allergen Reactions  . Isosorbide Mononitrate Other (See Comments)    Unknown, doesn't remember   . Ramipril Cough  . Quinidine Gluconate Rash      Family History  Problem Relation Age of Onset  . Stroke Father   . Aneurysm Father   . Hypertension Father   . Prostate cancer Brother   . Hypertension Mother   . Lung cancer Brother        smoker  . Thrombosis Brother   . Other Brother        RF valve disorder (smoker)  . Lung cancer Brother        smoker  . Breast cancer Sister   . Liver cancer Brother   . Other Sister        cerebral hemorrhage     Social History Mr. Zeimet reports that he has never smoked. He quit smokeless tobacco use about 25 years ago. Mr. Edgell reports that he does not drink alcohol.   Review of Systems CONSTITUTIONAL: No weight loss, fever, chills, weakness or fatigue.  HEENT: Eyes: No visual loss, blurred vision, double vision or yellow sclerae.No hearing loss, sneezing, congestion, runny nose or sore throat.  SKIN: No rash or itching.  CARDIOVASCULAR: per hpi RESPIRATORY: No shortness of breath, cough or sputum.  GASTROINTESTINAL: No anorexia, nausea, vomiting or diarrhea. No abdominal pain or blood.  GENITOURINARY: No burning on urination, no polyuria NEUROLOGICAL:  No headache, dizziness, syncope, paralysis, ataxia, numbness or tingling in the extremities. No change in bowel or bladder control.  MUSCULOSKELETAL: No muscle, back pain, joint pain or stiffness.  LYMPHATICS: No enlarged nodes. No history of splenectomy.  PSYCHIATRIC: No history of depression or anxiety.  ENDOCRINOLOGIC: No reports of sweating, cold or heat intolerance. No polyuria or polydipsia.  [name]   Physical Examination Vitals:   04/28/18 1125  BP: 128/78  Pulse: (!) 55  SpO2: 98%   Vitals:   04/28/18 1125  Weight: 164 lb (74.4 kg)  Height: 5' 5.5" (1.664 m)    Gen: resting comfortably, no acute distress HEENT: no scleral icterus, pupils equal round and reactive, no palptable cervical adenopathy,  CV: irreg, no m/r/g, no jvd Resp: Clear to auscultation bilaterally GI: abdomen is soft, non-tender, non-distended, normal bowel sounds, no hepatosplenomegaly MSK: extremities are warm, no edema.  Skin: warm, no rash Neuro:  no focal deficits Psych: appropriate affect      Assessment and Plan  1. CAD - no recent symptoms, continue current meds  2. Afib - no symptoms, continue current meds. Renal function somewhat labile, we will continue the eliquis 2.33m bid dosing at this time.  3. PAD - continue medical therapy  4. HTN - at goal, continue current meds   5. Hyperlipidemia - continue statin.    F/u 6 months   JArnoldo Lenis M.D.

## 2018-05-10 LAB — COMPLETE METABOLIC PANEL WITH GFR
AG RATIO: 1.2 (calc) (ref 1.0–2.5)
ALKALINE PHOSPHATASE (APISO): 47 U/L (ref 40–115)
ALT: 23 U/L (ref 9–46)
AST: 32 U/L (ref 10–35)
Albumin: 3.3 g/dL — ABNORMAL LOW (ref 3.6–5.1)
BUN/Creatinine Ratio: 24 (calc) — ABNORMAL HIGH (ref 6–22)
BUN: 34 mg/dL — ABNORMAL HIGH (ref 7–25)
CO2: 24 mmol/L (ref 20–32)
Calcium: 9.3 mg/dL (ref 8.6–10.3)
Chloride: 106 mmol/L (ref 98–110)
Creat: 1.41 mg/dL — ABNORMAL HIGH (ref 0.70–1.11)
GFR, Est African American: 52 mL/min/{1.73_m2} — ABNORMAL LOW (ref >=60)
GFR, Est Non African American: 44 mL/min/{1.73_m2} — ABNORMAL LOW (ref >=60)
GLOBULIN: 2.7 g/dL (ref 1.9–3.7)
Glucose, Bld: 103 mg/dL — ABNORMAL HIGH (ref 65–99)
POTASSIUM: 5 mmol/L (ref 3.5–5.3)
SODIUM: 135 mmol/L (ref 135–146)
Total Bilirubin: 0.5 mg/dL (ref 0.2–1.2)
Total Protein: 6 g/dL — ABNORMAL LOW (ref 6.1–8.1)

## 2018-05-10 LAB — CBC WITH DIFFERENTIAL/PLATELET
BASOS ABS: 78 {cells}/uL (ref 0–200)
Basophils Relative: 0.7 %
EOS PCT: 2.3 %
Eosinophils Absolute: 258 cells/uL (ref 15–500)
HEMATOCRIT: 31.5 % — AB (ref 38.5–50.0)
Hemoglobin: 10.3 g/dL — ABNORMAL LOW (ref 13.2–17.1)
LYMPHS ABS: 2766 {cells}/uL (ref 850–3900)
MCH: 30 pg (ref 27.0–33.0)
MCHC: 32.7 g/dL (ref 32.0–36.0)
MCV: 91.8 fL (ref 80.0–100.0)
MPV: 10 fL (ref 7.5–12.5)
Monocytes Relative: 6 %
NEUTROS PCT: 66.3 %
Neutro Abs: 7426 cells/uL (ref 1500–7800)
Platelets: 198 10*3/uL (ref 140–400)
RBC: 3.43 10*6/uL — ABNORMAL LOW (ref 4.20–5.80)
RDW: 14 % (ref 11.0–15.0)
Total Lymphocyte: 24.7 %
WBC: 11.2 10*3/uL — ABNORMAL HIGH (ref 3.8–10.8)
WBCMIX: 672 {cells}/uL (ref 200–950)

## 2018-05-13 ENCOUNTER — Ambulatory Visit (INDEPENDENT_AMBULATORY_CARE_PROVIDER_SITE_OTHER): Payer: PPO | Admitting: *Deleted

## 2018-05-13 DIAGNOSIS — Z23 Encounter for immunization: Secondary | ICD-10-CM

## 2018-05-26 DIAGNOSIS — S88112D Complete traumatic amputation at level between knee and ankle, left lower leg, subsequent encounter: Secondary | ICD-10-CM | POA: Diagnosis not present

## 2018-05-28 DIAGNOSIS — S88112D Complete traumatic amputation at level between knee and ankle, left lower leg, subsequent encounter: Secondary | ICD-10-CM | POA: Diagnosis not present

## 2018-05-28 DIAGNOSIS — E11621 Type 2 diabetes mellitus with foot ulcer: Secondary | ICD-10-CM | POA: Diagnosis not present

## 2018-05-31 ENCOUNTER — Encounter: Payer: Self-pay | Admitting: Family Medicine

## 2018-06-02 DIAGNOSIS — Z89512 Acquired absence of left leg below knee: Secondary | ICD-10-CM | POA: Diagnosis not present

## 2018-06-03 ENCOUNTER — Encounter (INDEPENDENT_AMBULATORY_CARE_PROVIDER_SITE_OTHER): Payer: PPO

## 2018-06-03 ENCOUNTER — Ambulatory Visit (INDEPENDENT_AMBULATORY_CARE_PROVIDER_SITE_OTHER): Payer: PPO | Admitting: Vascular Surgery

## 2018-06-07 ENCOUNTER — Ambulatory Visit: Payer: PPO | Attending: Vascular Surgery | Admitting: Physical Therapy

## 2018-06-07 ENCOUNTER — Encounter: Payer: Self-pay | Admitting: Physical Therapy

## 2018-06-07 ENCOUNTER — Other Ambulatory Visit: Payer: Self-pay

## 2018-06-07 DIAGNOSIS — M6281 Muscle weakness (generalized): Secondary | ICD-10-CM | POA: Diagnosis not present

## 2018-06-07 DIAGNOSIS — R293 Abnormal posture: Secondary | ICD-10-CM | POA: Diagnosis not present

## 2018-06-07 DIAGNOSIS — R2689 Other abnormalities of gait and mobility: Secondary | ICD-10-CM | POA: Diagnosis not present

## 2018-06-07 DIAGNOSIS — R2681 Unsteadiness on feet: Secondary | ICD-10-CM | POA: Diagnosis not present

## 2018-06-07 NOTE — Therapy (Signed)
[facility] [address] [place], [place], [address] Phone: [phone]   Fax:  [phone]  Physical Therapy Evaluation  Patient Details  Name: Eric [name]. MRN: [id number] Date of Birth: 09-17-1927 Referring Provider (PT): Eric Pain, MD   Encounter Date: 06/07/2018  PT End of Session - 06/07/18 1051    Visit Number  1    Number of Visits  25    Date for PT Re-Evaluation  09/05/18    Authorization Type  [employer]-Needs 10th visit PN    PT Start Time  0932    PT Stop Time  1017    PT Time Calculation (min)  45 min    Equipment Utilized During Treatment  Gait belt    Activity Tolerance  Patient tolerated treatment well    Behavior During Therapy  WFL for tasks assessed/performed       Past Medical History:  Diagnosis Date  . Atrial flutter ([place])    s/p RFCA  . CAD (coronary artery disease)    cath 2003, occluded S-RCA, occluded S-Dx, L-LAD ok, s/p PTCA to LAD  . Cataract   . CKD (chronic kidney disease) stage 3, GFR 30-59 ml/min (HCC)   . Diabetes mellitus    diet controlled  . Hearing aid worn    bilateral  . History of kidney stones   . Long term (current) use of anticoagulants   . Neuromuscular disorder ([place])   . OSA (obstructive sleep apnea) 12/11   very mild, AHI 7/hr  . Persistent atrial fibrillation 09/26/2015  . Pure hyperglyceridemia   . PVD (peripheral vascular disease) ([place])    angioplasty of his right lower extremity in [place] by Eric [name] 2013  . Unspecified essential hypertension   . Wears dentures    full upper and lower    Past Surgical History:  Procedure Laterality Date  . ABDOMINAL AORTOGRAM W/LOWER EXTREMITY N/A 11/15/2017   Procedure: ABDOMINAL AORTOGRAM W/LOWER EXTREMITY;  Surgeon: Eric Dutch, MD;  Location: [facility];  Service: Cardiovascular;  Laterality: N/A;  . Adenosine Myoview  3/06   EF 56%, neg. Ischemia  . Adenosine Myoview  02/18/07   nml  . AMPUTATION Left 12/15/2017   Procedure: AMPUTATION BELOW KNEE;  Surgeon: Eric [name], MD;  Location: [facility];  Service: Vascular;  Laterality: Left;  . ANGIOPLASTY  1/99   CAD- diogonal with rotational artherectomy  . Arthrectomy     of LAD & PTCA  . BLEPHAROPLASTY Bilateral   . CARDIAC CATHETERIZATION  1/00  . CARDIOVERSION  1/04  . CARDIOVERSION  5/07   hospital- a flutter  . CARPAL TUNNEL RELEASE     ? bilateral  . CATARACT EXTRACTION W/PHACO Right 07/28/2017   Procedure: CATARACT EXTRACTION PHACO AND INTRAOCULAR LENS PLACEMENT (Eric [name]) RIGHT DIABETIC;  Surgeon: Eric Koyanagi, MD;  Location: [facility];  Service: Ophthalmology;  Laterality: Right;  Diabetic - diet controlled  . CATARACT EXTRACTION W/PHACO Left 08/18/2017   Procedure: CATARACT EXTRACTION PHACO AND INTRAOCULAR LENS PLACEMENT ([facility]);  Surgeon: Eric Koyanagi, MD;  Location: [facility];  Service: Ophthalmology;  Laterality: Left;  DIABETES - oral meds  . COLONOSCOPY W/ BIOPSIES  10/01/06   sigmoid polyp bx neg, 3 years  . CORONARY ANGIOPLASTY  4/03   cutting balloon PTCA pLAD into Diag  . CORONARY ARTERY BYPASS GRAFT  2000   LIMA-LAD, SVG-RCA, SVG-Diag; SVG-Diag & SVG-RCA occluded 2003  . FRACTURE SURGERY    . HAND  SURGERY  08/27/09   R thumb procedure wit Scaphoid Gragt and screws, Eric [name]  . NM MYOVIEW LTD  4/11   normal  . PERIPHERAL VASCULAR CATHETERIZATION Right 06/27/2015   Procedure: Lower Extremity Angiography;  Surgeon: Eric [name], MD;  Location: [facility];  Service: Cardiovascular;  Laterality: Right;  . PERIPHERAL VASCULAR CATHETERIZATION  06/27/2015   Procedure: Lower Extremity Intervention;  Surgeon: Eric [name], MD;  Location: [facility];  Service: Cardiovascular;;    There were no vitals filed for this visit.   Subjective Assessment - 06/07/18 0938    Subjective  This 84yo male was referred by Eric Pain, MD for PT amputee evaluation. He  underwent a left Transtibial Amputation on 12/15/2017 due to gangrene. He had C-Diff with hospitalization. He received prosthesis 06/02/2018.    Patient is accompained by:  Family member   wife, Eric [name] & son, Eric [name]"    Pertinent History  L TTA, CAD, A-Fib, CKD3, DM2, PAD, HTN, HLD    Patient Stated Goals  He wants to use prosthesis so can walk & no longer needs his kids to transport & assist.     Currently in [name]?  No/denies         [facility] PT Assessment - 06/07/18 0930      Assessment   Medical Diagnosis  Left Transtibial Amputation    Referring Provider (PT)  Eric Pain, MD    Onset Date/Surgical Date  06/02/18   Prosthesis delivery    Hand Dominance  Right    Prior [place] in [place]      Precautions   Precautions  Fall      Balance Screen   Has the patient fallen in the past 6 months  Yes    How many times?  1   no injuries   Has the patient had a decrease in activity level because of a fear of falling?   Yes    Is the patient reluctant to leave their home because of a fear of falling?   Yes      [place]  Private residence    Living Arrangements  Spouse/significant other   5 children, 4 live close   Type of [place] entrance   other entrances basement with chair lift   [place]  One level   basement has extra room that he likes to there   Alternate Level Stairs-Number of Steps  13    Alternate Level Stairs-Rails  --   chair lift   Home Equipment  Wheelchair - manual;Walker - standard;Walker - 4 wheels;Wheelchair - power;Cane - single point;Tub bench    Additional Comments  TTA prosthesis       Prior Function   Level of Independence  Independent;Independent with household mobility without device;Independent with community mobility without device   used walking stick for uneven & long distance   Vocation  Retired    Leisure  restore antinques,       Editor, commissioning   Impaired Detail    Light Touch Impaired Details  Impaired RLE   history of neuropathy in R foot   Hot/Cold  Appears Intact    Proprioception  Appears Intact      Coordination   Gross Motor Movements are Fluid and Coordinated  Yes    Fine Motor Movements are Fluid and Coordinated  No  Posture/Postural Control   Posture/Postural Control  Postural limitations    Postural Limitations  Rounded Shoulders;Forward head;Flexed trunk;Weight shift left;Decreased lumbar lordosis      ROM / Strength   AROM / PROM / Strength  Strength;AROM      AROM   Overall AROM   Within functional limits for tasks performed      Strength   Overall Strength  Deficits    Overall Strength Comments  Seated MMT:  B hip flex 3+/5, B knee flex/ext 4/5, R ankle DF 3/5      Flexibility   Soft Tissue Assessment /Muscle Length  yes    Hamstrings  B knees grossly have full ROM      Transfers   Transfers  Sit to Stand;Stand to Sit    Sit to Stand  5: Supervision;With upper extremity assist    Sit to Stand Details  Verbal cues for sequencing;Verbal cues for technique;Verbal cues for precautions/safety    Sit to Stand Details (indicate cue type and reason)  Pt requires use of UEs to stand with support of BLEs on back of chair.      Stand to Sit  5: Supervision    Stand to Sit Details (indicate cue type and reason)  Verbal cues for sequencing;Verbal cues for technique;Verbal cues for precautions/safety    Stand to Sit Details  Cues for safety and technique      Ambulation/Gait   Ambulation/Gait  Yes    Ambulation/Gait Assistance  4: Min assist    Ambulation/Gait Assistance Details  Pt ambulates with moderate reliance of UEs on RW with only partial WB through L prothesis.  Note that L knee remains flexed during stance and note BLEs adduct during gait causing him to step on his own foot.  LIkely an indicator of B hip abd weakness.     Ambulation Distance (Feet)  100 Feet    Assistive device  Rolling walker    Gait  Pattern  Step-through pattern;Decreased stance time - left;Decreased weight shift to left;Left flexed knee in stance;Lateral hip instability;Trunk flexed;Narrow base of support;Scissoring    Ambulation Surface  Level;Indoor    Gait velocity  1.15 ft/sec with RW and min A      Standardized Balance Assessment   Standardized Balance Assessment  Berg Balance Test      Berg Balance Test   Sit to Stand  Able to stand  independently using hands    Standing Unsupported  Able to stand 2 minutes with supervision    Sitting with Back Unsupported but Feet Supported on Floor or Stool  Able to sit safely and securely 2 minutes    Stand to Sit  Uses backs of legs against chair to control descent    Transfers  Able to transfer safely, definite need of hands    Standing Unsupported with Eyes Closed  Needs help to keep from falling    Standing Ubsupported with Feet Together  Needs help to attain position and unable to hold for 15 seconds    From Standing, Reach Forward with Outstretched Arm  Loses balance while trying/requires external support    From Standing Position, Pick up Object from Floor  Unable to try/needs assist to keep balance    From Standing Position, Turn to Look Behind Over each Shoulder  Needs supervision when turning    Turn 360 Degrees  Needs assistance while turning    Standing Unsupported, Alternately Place Feet on Step/Stool  Needs assistance to keep from falling or  unable to try    Standing Unsupported, One Foot in [employer] balance while stepping or standing    Standing on One Leg  Unable to try or needs assist to prevent fall    Total Score  16    Berg comment:  < 36 high risk for falls (close to 100%)       Prosthetics Assessment - 06/07/18 0930      Prosthetics   Prosthetic Care Dependent with  Skin check;Residual limb care;Care of non-amputated limb;Prosthetic cleaning;Ply sock cleaning;Correct ply sock adjustment;Proper wear schedule/adjustment;Proper weight-bearing  schedule/adjustment    Donning prosthesis   Supervision    Doffing prosthesis   Supervision    Current prosthetic wear tolerance (days/week)   Pt reports worn 5 of 5 days since delivery    Current prosthetic wear tolerance (#hours/day)   Pt reports wearing 45 minutes 2x/day    Current prosthetic weight-bearing tolerance (hours/day)   Patient tolerated standing for 5 minutes with partial weight on prosthesis without c/o [name] or discomfort    Edema  pitting    Residual limb condition   4 dry scabs on incision, no hair growth, shiny skin, normal color & temperature, cylinderical shape    K code/activity level with prosthetic use   K2 basic community level                Objective measurements completed on examination: See above findings.      [facility] Adult PT Treatment/Exercise - 06/07/18 0930      Prosthetics   Prosthetic Care Comments   PT recommended wear 2hrs 2x/day & increase 7 days if no issues    Education Provided  Skin check;Residual limb care;Prosthetic cleaning;Correct ply sock adjustment;Proper Donning;Proper wear schedule/adjustment;Other (comment)   see prosthetic care comments   Person(s) Educated  Patient;Spouse;Child(ren)    Education Method  Explanation;Demonstration;Tactile cues;Verbal cues    Education Method  Verbalized understanding;Verbal cues required;Needs further instruction               PT Short Term Goals - 06/07/18 1956      PT SHORT TERM GOAL #1   Title  Pt will verbalize/demonstrate understanding of prosthetic care and appropriate wear time/ply sock adjustment to increase independence with functional mobility.  (Target Date: 07/07/18-please make sure and set new STGs once these are checked)    Time  4    Period  Weeks    Status  New    Target Date  07/07/18      PT SHORT TERM GOAL #2   Title  Pt will improve BERG balance test to 20/56 in order to indicate decreased fall risk.      Time  4    Period  Weeks    Status  New      PT  SHORT TERM GOAL #3   Title  Pt will tolerate wearing prosthesis >/=60% of awake hours in order to increase pts functional mobility.      Time  4    Period  Weeks    Status  New      PT SHORT TERM GOAL #4   Title  Pt will ambulate x 150' with RW and prosthesis at S level over indoor surfaces in order to indicate safe household ambulation.     Time  4    Period  Weeks    Status  New      PT SHORT TERM GOAL #5   Title  Pt will improve gait speed  w/ LRAD to >/=1.65 ft/sec in order to indicate decreased fall risk.     Time  4    Period  Weeks    Status  New        PT Long Term Goals - 06/07/18 2007      PT LONG TERM GOAL #1   Title  Pt will be independent with HEP along with prosthetic care in order to indicate improved functional mobility and safety with prosthesis.  (Target Date: 09/05/17)    Time  12    Period  Weeks    Status  New    Target Date  09/05/18      PT LONG TERM GOAL #2   Title  Pt will tolerate wearing prosthesis >/=90% of awake hours without negative skin complications.     Time  12    Period  Weeks    Status  New      PT LONG TERM GOAL #3   Title  Pt will ambulate w/ LRAD >500' over indoor and unlevel paved outdoor surfaces at mod I level in order to indicate safe home and limited community negotiation.     Time  12    Period  Weeks    Status  New      PT LONG TERM GOAL #4   Title  Pt will improve BERG balance score to >/=28/56 in order to indicate decreased fall risk.     Time  12    Period  Weeks    Status  New      PT LONG TERM GOAL #5   Title  Pt will improve gait speed to >/=2.25 ft/sec w/ LRAD in order to indicate decreased fall risk.      Time  12    Period  Weeks    Status  New             Plan - 06/07/18 1052    Clinical Impression Statement  Pt presents s/p L transtibial amputation on 12/15/17 with D/C from hospital to [facility] then home (no home health PT).  Note delivery of L prosthesis on 06/02/18.  He has began to wear leg 5/5  days for 45 mins, 2x/day so far without issues.  Note history of CAD, afib, DMII with neuropathy, CKD III, and PAD that could all impact progress with therapy.  Upon PT evaluation, note pt unable to fully WB through prothesis with heavy UE reliance on RW, BERG balance score of 16/56 indicative of 100% fall risk, and gait speed of 1.15 ft/sec with RW indicative of high fall risk.  Pt will benefit from skilled OP neuro PT in order to address deficits and increase independence with prothesis    History and Personal Factors relevant to plan of care:  see above    Clinical Presentation  Evolving    Clinical Presentation due to:  see above    Clinical Decision Making  Moderate    Rehab Potential  Good    Clinical Impairments Affecting Rehab Potential  Pt motivated to progress and become as independent as possible, limited by number of co-morbidities    PT Frequency  2x / week    PT Duration  12 weeks    PT Treatment/Interventions  ADLs/Self Care Home Management;DME Instruction;Gait training;Stair training;Functional mobility training;Therapeutic activities;Therapeutic exercise;Balance training;Neuromuscular re-education;Prosthetic Training;Passive range of motion;Energy conservation;Vestibular    PT Next Visit Plan  Continue to educate on prosthetic wear/care (sock education), provide pt with sink HEP, gait with RW,  address B hip abd weakness    Consulted and Agree with Plan of Care  Patient;Family member/caregiver    Family Member Consulted  wife, son       Patient will benefit from skilled therapeutic intervention in order to improve the following deficits and impairments:  Abnormal gait, Decreased activity tolerance, Decreased balance, Decreased endurance, Decreased knowledge of precautions, Decreased knowledge of use of DME, Decreased mobility, Decreased strength, Difficulty walking, Impaired perceived functional ability, Impaired flexibility, Impaired sensation, Prosthetic Dependency, Postural  dysfunction  Visit Diagnosis: Unsteadiness on feet  Other abnormalities of gait and mobility  Muscle weakness (generalized)  Abnormal posture     Problem List Patient Active Problem List   Diagnosis Date Noted  . Hx of BKA, left ([place]) 01/13/2018  . Nausea and vomiting in adult 12/26/2017  . Pressure injury of skin 12/23/2017  . C. difficile colitis 12/22/2017  . UTI (urinary tract infection) 12/22/2017  . Hyponatremia 12/22/2017  . Normocytic anemia 12/22/2017  . Chronic anticoagulation 09/26/2015  . Persistent atrial fibrillation 09/26/2015  . PVD (peripheral vascular disease) ([place]) 06/02/2012  . BPH (benign prostatic hyperplasia) 11/16/2011  . AKI (acute kidney injury) ([place]) 08/26/2011  . Coronary artery disease 03/25/2011  . OBSTRUCTIVE SLEEP APNEA 09/15/2010  . PERIODIC LIMB MOVEMENT DISORDER 09/15/2010  . PERSONAL HISTORY OF COLONIC POLYPS 10/25/2009  . VITAMIN B12 DEFICIENCY 03/04/2009  . HYPERTRIGLYCERIDEMIA 11/24/2006  . Essential hypertension 11/24/2006    Cameron Sprang, PT, MPT [facility] [address] [place], [place], [address] Phone: [phone]   Fax:  [phone] 06/07/18, 8:17 PM  Name: Eric [name]. MRN: [id number] Date of Birth: 1928-07-26

## 2018-06-09 ENCOUNTER — Ambulatory Visit (INDEPENDENT_AMBULATORY_CARE_PROVIDER_SITE_OTHER): Payer: PPO

## 2018-06-09 ENCOUNTER — Encounter (INDEPENDENT_AMBULATORY_CARE_PROVIDER_SITE_OTHER): Payer: Self-pay | Admitting: Nurse Practitioner

## 2018-06-09 ENCOUNTER — Ambulatory Visit (INDEPENDENT_AMBULATORY_CARE_PROVIDER_SITE_OTHER): Payer: PPO | Admitting: Nurse Practitioner

## 2018-06-09 VITALS — BP 118/55 | HR 51 | Resp 16 | Ht 65.5 in

## 2018-06-09 DIAGNOSIS — I10 Essential (primary) hypertension: Secondary | ICD-10-CM

## 2018-06-09 DIAGNOSIS — I739 Peripheral vascular disease, unspecified: Secondary | ICD-10-CM

## 2018-06-09 DIAGNOSIS — E781 Pure hyperglyceridemia: Secondary | ICD-10-CM | POA: Diagnosis not present

## 2018-06-09 DIAGNOSIS — Z89512 Acquired absence of left leg below knee: Secondary | ICD-10-CM

## 2018-06-10 ENCOUNTER — Ambulatory Visit: Payer: PPO | Attending: Vascular Surgery

## 2018-06-10 ENCOUNTER — Ambulatory Visit (INDEPENDENT_AMBULATORY_CARE_PROVIDER_SITE_OTHER): Payer: PPO | Admitting: Family Medicine

## 2018-06-10 ENCOUNTER — Encounter: Payer: Self-pay | Admitting: Family Medicine

## 2018-06-10 ENCOUNTER — Other Ambulatory Visit: Payer: Self-pay

## 2018-06-10 VITALS — BP 120/60 | HR 57 | Wt 165.0 lb

## 2018-06-10 DIAGNOSIS — M6281 Muscle weakness (generalized): Secondary | ICD-10-CM | POA: Diagnosis not present

## 2018-06-10 DIAGNOSIS — L84 Corns and callosities: Secondary | ICD-10-CM | POA: Diagnosis not present

## 2018-06-10 DIAGNOSIS — R2689 Other abnormalities of gait and mobility: Secondary | ICD-10-CM | POA: Diagnosis not present

## 2018-06-10 DIAGNOSIS — R293 Abnormal posture: Secondary | ICD-10-CM | POA: Diagnosis not present

## 2018-06-10 DIAGNOSIS — R2681 Unsteadiness on feet: Secondary | ICD-10-CM

## 2018-06-10 DIAGNOSIS — A09 Infectious gastroenteritis and colitis, unspecified: Secondary | ICD-10-CM

## 2018-06-10 MED ORDER — SACCHAROMYCES BOULARDII 250 MG PO CAPS: 250.0000 mg | ORAL_CAPSULE | Freq: Every day | ORAL | 3 refills | Status: DC

## 2018-06-10 NOTE — Therapy (Signed)
[facility] [address] [place], [place], [address] Phone: [phone]   Fax:  [phone]  Physical Therapy Treatment  Patient Details  Name: Eric [name]. MRN: [id number] Date of Birth: 1928/04/07 Referring Provider (PT): Eric Pain, MD   Encounter Date: 06/10/2018  PT End of Session - 06/10/18 1142    Visit Number  2    Number of Visits  25    Date for PT Re-Evaluation  09/05/18    Authorization Type  [employer]-Needs 10th visit PN    PT Start Time  1030    PT Stop Time  1100    PT Time Calculation (min)  30 min    Equipment Utilized During Treatment  Gait belt    Activity Tolerance  Patient tolerated treatment well    Behavior During Therapy  WFL for tasks assessed/performed       Past Medical History:  Diagnosis Date  . Atrial flutter ([place])    s/p RFCA  . CAD (coronary artery disease)    cath 2003, occluded S-RCA, occluded S-Dx, L-LAD ok, s/p PTCA to LAD  . Cataract   . CKD (chronic kidney disease) stage 3, GFR 30-59 ml/min ([facility])   . Diabetes mellitus    diet controlled  . Hearing aid worn    bilateral  . History of kidney stones   . Long term (current) use of anticoagulants   . Neuromuscular disorder ([place])   . OSA (obstructive sleep apnea) 12/11   very mild, AHI 7/hr  . Persistent atrial fibrillation 09/26/2015  . Pure hyperglyceridemia   . PVD (peripheral vascular disease) ([place])    angioplasty of his right lower extremity in [place] by Dr.[name] 2013  . Unspecified essential hypertension   . Wears dentures    full upper and lower    Past Surgical History:  Procedure Laterality Date  . ABDOMINAL AORTOGRAM W/LOWER EXTREMITY N/A 11/15/2017   Procedure: ABDOMINAL AORTOGRAM W/LOWER EXTREMITY;  Surgeon: Eric Dutch, MD;  Location: [facility];  Service: Cardiovascular;  Laterality: N/A;  . Adenosine Myoview  3/06   EF 56%, neg. Ischemia  . Adenosine Myoview  02/18/07    nml  . AMPUTATION Left 12/15/2017   Procedure: AMPUTATION BELOW KNEE;  Surgeon: Eric [name], MD;  Location: [facility];  Service: Vascular;  Laterality: Left;  . ANGIOPLASTY  1/99   CAD- diogonal with rotational artherectomy  . Arthrectomy     of LAD & PTCA  . BLEPHAROPLASTY Bilateral   . CARDIAC CATHETERIZATION  1/00  . CARDIOVERSION  1/04  . CARDIOVERSION  5/07   hospital- a flutter  . CARPAL TUNNEL RELEASE     ? bilateral  . CATARACT EXTRACTION W/PHACO Right 07/28/2017   Procedure: CATARACT EXTRACTION PHACO AND INTRAOCULAR LENS PLACEMENT ([place]) RIGHT DIABETIC;  Surgeon: Eric Koyanagi, MD;  Location: [facility];  Service: Ophthalmology;  Laterality: Right;  Diabetic - diet controlled  . CATARACT EXTRACTION W/PHACO Left 08/18/2017   Procedure: CATARACT EXTRACTION PHACO AND INTRAOCULAR LENS PLACEMENT ([facility]);  Surgeon: Eric Koyanagi, MD;  Location: [facility];  Service: Ophthalmology;  Laterality: Left;  DIABETES - oral meds  . COLONOSCOPY W/ BIOPSIES  10/01/06   sigmoid polyp bx neg, 3 years  . CORONARY ANGIOPLASTY  4/03   cutting balloon PTCA pLAD into Diag  . CORONARY ARTERY BYPASS GRAFT  2000   LIMA-LAD, SVG-RCA, SVG-Diag; SVG-Diag & SVG-RCA occluded 2003  . FRACTURE SURGERY    .  HAND SURGERY  08/27/09   R thumb procedure wit Scaphoid Gragt and screws, Dr Eric [name]  . NM MYOVIEW LTD  4/11   normal  . PERIPHERAL VASCULAR CATHETERIZATION Right 06/27/2015   Procedure: Lower Extremity Angiography;  Surgeon: Eric [name], MD;  Location: [facility];  Service: Cardiovascular;  Laterality: Right;  . PERIPHERAL VASCULAR CATHETERIZATION  06/27/2015   Procedure: Lower Extremity Intervention;  Surgeon: Eric [name], MD;  Location: [facility];  Service: Cardiovascular;;    There were no vitals filed for this visit.  Subjective Assessment - 06/10/18 0936    Subjective  Pt states he is wearing his prosthesis the suggested 2hr 2x a day. States he is not  experiencing any issues with donning/doffing or abnormal skin issues.     Patient is accompained by:  Family member   wife, Eric [name] & son, Eric [name]"    Pertinent History  L TTA, CAD, A-Fib, CKD3, DM2, PAD, HTN, HLD    Patient Stated Goals  He wants to use prosthesis so can walk & no longer needs his kids to transport & assist.     Currently in [name]?  No/denies                       [facility] Adult PT Treatment/Exercise - 06/10/18 1110      Transfers   Transfers  Sit to Stand    Sit to Stand  5: Supervision;With upper extremity assist;With armrests;From chair/3-in-1    Sit to Stand Details  Verbal cues for precautions/safety;Verbal cues for safe use of DME/AE   verbal cues to lock wheelchair    Stand to Sit  5: Supervision;Without upper extremity assist;To chair/3-in-1    Stand to Sit Details (indicate cue type and reason)  Visual cues/gestures for precautions/safety      Ambulation/Gait   Ambulation/Gait  Yes    Ambulation/Gait Assistance  4: Min guard;4: Min assist    Ambulation/Gait Assistance Details  Pt required cueing to increase weight bearing on L and to abduct prosthesis to keep from stepping on his feet.     Ambulation Distance (Feet)  200 Feet    Assistive device  Rolling walker    Gait Pattern  Step-through pattern;Decreased stance time - right;Decreased weight shift to left;Lateral hip instability;Trunk flexed;Narrow base of support;Left flexed knee in stance    Ambulation Surface  Level;Indoor      High Level Balance   High Level Balance Comments  Pt performed prosthesis sink program with verbal cues to increase weight bearing on left and to keep prosthesis abducted for equal weight bearing and equal distance from tape.        Prosthetics   Current prosthetic wear tolerance (#hours/day)   Pt reports wearing 2 hrs 2x a day and sometimes longer.     Current prosthetic weight-bearing tolerance (hours/day)   Pt reports being about to tolerate 6 minutes  off standing     Education Provided  Proper wear schedule/adjustment;Skin check    Person(s) Educated  Patient    Education Method  Explanation    Education Method  Verbalized understanding             PT Education - 06/10/18 1528    Education Details  Sink HEP    Person(s) Educated  Patient    Methods  Explanation;Demonstration;Verbal cues;Handout    Comprehension  Verbalized understanding;Returned demonstration;Verbal cues required       PT Short Term Goals -  06/07/18 1956      PT SHORT TERM GOAL #1   Title  Pt will verbalize/demonstrate understanding of prosthetic care and appropriate wear time/ply sock adjustment to increase independence with functional mobility.  (Target Date: 07/07/18-please make sure and set new STGs once these are checked)    Time  4    Period  Weeks    Status  New    Target Date  07/07/18      PT SHORT TERM GOAL #2   Title  Pt will improve BERG balance test to 20/56 in order to indicate decreased fall risk.      Time  4    Period  Weeks    Status  New      PT SHORT TERM GOAL #3   Title  Pt will tolerate wearing prosthesis >/=60% of awake hours in order to increase pts functional mobility.      Time  4    Period  Weeks    Status  New      PT SHORT TERM GOAL #4   Title  Pt will ambulate x 150' with RW and prosthesis at S level over indoor surfaces in order to indicate safe household ambulation.     Time  4    Period  Weeks    Status  New      PT SHORT TERM GOAL #5   Title  Pt will improve gait speed w/ LRAD to >/=1.65 ft/sec in order to indicate decreased fall risk.     Time  4    Period  Weeks    Status  New        PT Long Term Goals - 06/07/18 2007      PT LONG TERM GOAL #1   Title  Pt will be independent with HEP along with prosthetic care in order to indicate improved functional mobility and safety with prosthesis.  (Target Date: 09/05/17)    Time  12    Period  Weeks    Status  New    Target Date  09/05/18      PT LONG  TERM GOAL #2   Title  Pt will tolerate wearing prosthesis >/=90% of awake hours without negative skin complications.     Time  12    Period  Weeks    Status  New      PT LONG TERM GOAL #3   Title  Pt will ambulate w/ LRAD >500' over indoor and unlevel paved outdoor surfaces at mod I level in order to indicate safe home and limited community negotiation.     Time  12    Period  Weeks    Status  New      PT LONG TERM GOAL #4   Title  Pt will improve BERG balance score to >/=28/56 in order to indicate decreased fall risk.     Time  12    Period  Weeks    Status  New      PT LONG TERM GOAL #5   Title  Pt will improve gait speed to >/=2.25 ft/sec w/ LRAD in order to indicate decreased fall risk.      Time  12    Period  Weeks    Status  New            Plan - 06/10/18 1135    Clinical Impression Statement  Pt initally left his liner at home. Pt was scheduled for next appointment time while his  son brought his liner. Pt is compliant with recommended wear schedule with no complications. Today's session focused on the initiation of the sink program to emphasize balane and weight shitfing as well as gait with RW. Pt required min a and verbal cues to increase weight bearing on prosthesis, to keep a wider BOS, and to prevent adduction on left during ambulation. Pt would benefit from continued PT in order to increase independence with prosthesis.      Rehab Potential  Good    Clinical Impairments Affecting Rehab Potential  Pt motivated to progress and become as independent as possible, limited by number of co-morbidities    PT Frequency  2x / week    PT Duration  12 weeks    PT Treatment/Interventions  ADLs/Self Care Home Management;DME Instruction;Gait training;Stair training;Functional mobility training;Therapeutic activities;Therapeutic exercise;Balance training;Neuromuscular re-education;Prosthetic Training;Passive range of motion;Energy conservation;Vestibular    PT Next Visit Plan   Continue to educate on prosthetic wear/care (sock education), provide pt with sink HEP, gait with RW, address B hip abd weakness    Consulted and Agree with Plan of Care  Patient;Family member/caregiver    Family Member Consulted  wife, son       Patient will benefit from skilled therapeutic intervention in order to improve the following deficits and impairments:  Abnormal gait, Decreased activity tolerance, Decreased balance, Decreased endurance, Decreased knowledge of precautions, Decreased knowledge of use of DME, Decreased mobility, Decreased strength, Difficulty walking, Impaired perceived functional ability, Impaired flexibility, Impaired sensation, Prosthetic Dependency, Postural dysfunction  Visit Diagnosis: Unsteadiness on feet  Other abnormalities of gait and mobility  Muscle weakness (generalized)  Abnormal posture     Problem List Patient Active Problem List   Diagnosis Date Noted  . Hx of BKA, left ([place]) 01/13/2018  . Nausea and vomiting in adult 12/26/2017  . Pressure injury of skin 12/23/2017  . C. difficile colitis 12/22/2017  . UTI (urinary tract infection) 12/22/2017  . Hyponatremia 12/22/2017  . Normocytic anemia 12/22/2017  . Chronic anticoagulation 09/26/2015  . Persistent atrial fibrillation 09/26/2015  . PVD (peripheral vascular disease) ([place]) 06/02/2012  . BPH (benign prostatic hyperplasia) 11/16/2011  . AKI (acute kidney injury) ([place]) 08/26/2011  . Coronary artery disease 03/25/2011  . OBSTRUCTIVE SLEEP APNEA 09/15/2010  . PERIODIC LIMB MOVEMENT DISORDER 09/15/2010  . PERSONAL HISTORY OF COLONIC POLYPS 10/25/2009  . VITAMIN B12 DEFICIENCY 03/04/2009  . HYPERTRIGLYCERIDEMIA 11/24/2006  . Essential hypertension 11/24/2006   Cecile Sheerer, SPTA  06/10/2018, 3:29 PM  [facility] [address] [place], [place], [address] Phone: [phone]   Fax:  [phone]  Name: Eric [name]. MRN: [id number] Date of Birth: 24-Feb-1928

## 2018-06-10 NOTE — Progress Notes (Signed)
Subjective:    Patient ID: Eric [name]., male    DOB: Nov 26, 1927, 84 y.o.   MRN: [id number]  HPI 01/26/18 Patient is here today for hospital discharge follow-up.  When I last saw the patient, he had a necrotic nonhealing ulcer on the left first MTP joint extending over into the left great toe and onto the dorsum of the left foot with dry gangrene.  He been treated with numerous rounds of antibiotics and that also subsequently developed C. difficile colitis.  He was admitted to the hospital and underwent a below the knee amputation of the left leg under the care of [name].  His C. difficile colitis was treated in the hospital with oral vancomycin.  Patient was discharged from the hospital to a skilled nursing facility for rehab.  He was readmitted to the hospital on 2 separate occasions once with dehydration and Pseudomonas UTI and the second time for dehydration and acute kidney injury.  He is here today for follow-up.  I examined the patient's stump.  Staples are still in place.  The operative site is intact.  There is black scab in the lateral aspect of the operative site but there is no wound dehiscence and there is no evidence of cellulitis.  This appears to be a thick dry scab.  He is seeing his vascular surgeon again on Tuesday for another wound check.  He has not been fitted yet for a prosthetic device he is still confined to a wheelchair but he is back home.  He denies any diarrhea.  He has 3-4 bowel movements a day.  They are soft but he denies any watery, melanotic, or foul-smelling stool.  He denies any dysuria fevers chills or urgency.  AT that time, my plan was: Follow-up with [name] as planned.  Once staples have been removed and the patient has been cleared, I would like to have him fitted for a prosthetic device.  We will arrange this if not already done so by his surgeon once he has been surgically cleared.  In the meantime patient is nonweightbearing on his left leg and is confined  to a wheelchair.  I would like to repeat a urine sample to ensure complete resolution of his Pseudomonas UTI.  I would obtain a CBC to monitor for anemia postoperatively.  I will also check a CMP to evaluate for any electrolyte disturbances or evidence of dehydration.  Patient is still taking a probiotic which given his recent severe C. difficile colitis I believe is a good idea.  The wound was dressed today with Silvadene over the operative site, covered with nonadherent gauze, and then wrapping Coban.  02/24/18 Patient has a history of C. difficile colitis prior to his hospitalization.  He states that 10 days ago he started developing watery diarrhea.  Over the last 2 to 3 days the diarrhea has become constant.  He is now having loose bowel movements every 1-2 hours at home.  The majority the time is just water.  He denies any fevers or chills or abdominal pain however the diarrhea is extremely foul-smelling.  He is concerned that this could be a recurrence.  He denies any recent travel.  He denies any medication changes.  He denies any sick contacts.  At that time, my plan was: Given his previous history, I am concerned about C. difficile colitis as well.  Given his frailty and his advanced age, I do not want to delay treatment.  I will send  him home with stool collection kit tonight to perform a GI pathogen panel as well as to evaluate for C. difficile toxin A and B.  As soon as he obtains the specimen, I want him to start vancomycin 125 mg p.o. 4 times daily for at least 14 days.  We may need to perform a prolonged taper if in fact this is C. difficile colitis.  Recommended that he push Gatorade to avoid dehydration and recheck on Monday  06/10/18 Patient was ultimately discovered to have C. difficile colitis.  He completed a prolonged taper of vancomycin as documented in his chart.  Been off the medication now for 2 months and has had no further diarrhea.  Approximately 2 weeks ago, he started developing  watery diarrhea.  He states that he will go to the bathroom 4-6 times in the day.  Occasionally he would have fecal incontinence.  He reports watery stool.  He denies any blood in the stool.  He denies any fevers or chills or abdominal pain.  Over the last few days however the diarrhea has lessened and today he has had no episodes of diarrhea.  Yesterday he only had 2 episodes.  He is very concerned however about possible re-exposure to C. difficile.  Denies any travel.  He denies any sick contacts.  He also reports a sore forming on the plantar aspect of his right fifth MTP joint.  On visual inspection there is a 2 cm pre-ulcerative callus on the plantar aspect of the fifth MTP joint Past Medical History:  Diagnosis Date  . Atrial flutter ([place])    s/p RFCA  . CAD (coronary artery disease)    cath 2003, occluded S-RCA, occluded S-Dx, L-LAD ok, s/p PTCA to LAD  . Cataract   . CKD (chronic kidney disease) stage 3, GFR 30-59 ml/min ([facility])   . Diabetes mellitus    diet controlled  . Hearing aid worn    bilateral  . History of kidney stones   . Long term (current) use of anticoagulants   . Neuromuscular disorder ([place])   . OSA (obstructive sleep apnea) 12/11   very mild, AHI 7/hr  . Persistent atrial fibrillation 09/26/2015  . Pure hyperglyceridemia   . PVD (peripheral vascular disease) ([place])    angioplasty of his right lower extremity in [place] by Dr.[name] 2013  . Unspecified essential hypertension   . Wears dentures    full upper and lower   Past Surgical History:  Procedure Laterality Date  . ABDOMINAL AORTOGRAM W/LOWER EXTREMITY N/A 11/15/2017   Procedure: ABDOMINAL AORTOGRAM W/LOWER EXTREMITY;  Surgeon: [name], MD;  Location: [facility];  Service: Cardiovascular;  Laterality: N/A;  . Adenosine Myoview  3/06   EF 56%, neg. Ischemia  . Adenosine Myoview  02/18/07   nml  . AMPUTATION Left 12/15/2017   Procedure: AMPUTATION BELOW KNEE;  Surgeon: Algernon [name], MD;   Location: [facility];  Service: Vascular;  Laterality: Left;  . ANGIOPLASTY  1/99   CAD- diogonal with rotational artherectomy  . Arthrectomy     of LAD & PTCA  . BLEPHAROPLASTY Bilateral   . CARDIAC CATHETERIZATION  1/00  . CARDIOVERSION  1/04  . CARDIOVERSION  5/07   hospital- a flutter  . CARPAL TUNNEL RELEASE     ? bilateral  . CATARACT EXTRACTION W/PHACO Right 07/28/2017   Procedure: CATARACT EXTRACTION PHACO AND INTRAOCULAR LENS PLACEMENT ([place]) RIGHT DIABETIC;  Surgeon: [name], MD;  Location: [facility];  Service:  Ophthalmology;  Laterality: Right;  Diabetic - diet controlled  . CATARACT EXTRACTION W/PHACO Left 08/18/2017   Procedure: CATARACT EXTRACTION PHACO AND INTRAOCULAR LENS PLACEMENT ([facility]);  Surgeon: [name], MD;  Location: [facility];  Service: Ophthalmology;  Laterality: Left;  DIABETES - oral meds  . COLONOSCOPY W/ BIOPSIES  10/01/06   sigmoid polyp bx neg, 3 years  . CORONARY ANGIOPLASTY  4/03   cutting balloon PTCA pLAD into Diag  . CORONARY ARTERY BYPASS GRAFT  2000   LIMA-LAD, SVG-RCA, SVG-Diag; SVG-Diag & SVG-RCA occluded 2003  . FRACTURE SURGERY    . HAND SURGERY  08/27/09   R thumb procedure wit Scaphoid Gragt and screws, Dr [name]  . NM MYOVIEW LTD  4/11   normal  . PERIPHERAL VASCULAR CATHETERIZATION Right 06/27/2015   Procedure: Lower Extremity Angiography;  Surgeon: Algernon [name], MD;  Location: [facility];  Service: Cardiovascular;  Laterality: Right;  . PERIPHERAL VASCULAR CATHETERIZATION  06/27/2015   Procedure: Lower Extremity Intervention;  Surgeon: Algernon [name], MD;  Location: [facility];  Service: Cardiovascular;;   Current Outpatient Medications on File Prior to Visit  Medication Sig Dispense Refill  . acetaminophen (TYLENOL) 325 MG tablet Take 2 tablets (650 mg total) by mouth every 6 (six) hours as needed for mild pain (or temp >/= 101 F).    [name] amLODipine (NORVASC) 10 MG tablet     .  apixaban (ELIQUIS) 2.5 MG TABS tablet Take 1 tablet (2.5 mg total) by mouth 2 (two) times daily. 180 tablet 1  . B Complex Vitamins (B COMPLEX-B12) TABS Take 1 tablet by mouth daily.     . Blood Glucose Monitoring Suppl (ONE TOUCH ULTRA SYSTEM KIT) W/DEVICE KIT 1 kit by Does not apply route once. 1 each 0  . calcium carbonate (TUMS - DOSED IN MG ELEMENTAL CALCIUM) 500 MG chewable tablet Chew 1 tablet by mouth daily as needed for indigestion or heartburn.    . cholecalciferol (VITAMIN D) 1000 UNITS tablet Take 1,000 Units by mouth 2 (two) times a week. Takes on Mon and Fri    . Cinnamon 500 MG capsule Take 1,000 mg by mouth 2 (two) times daily.     . feeding supplement, GLUCERNA SHAKE, (GLUCERNA SHAKE) LIQD Take 237 mLs by mouth daily.    . fenofibrate 160 MG tablet Take 1 tablet (160 mg total) by mouth daily. 90 tablet 3  . ferrous sulfate 325 (65 FE) MG EC tablet Take 325 mg by mouth 3 (three) times daily with meals.    . finasteride (PROSCAR) 5 MG tablet TAKE 1 TABLET BY MOUTH ONCE DAILY 90 tablet 3  . fish oil-omega-3 fatty acids 1000 MG capsule Take 1,000 mg by mouth daily.     [name] glucose blood test strip Use as instructed 100 each 3  . lisinopril (PRINIVIL,ZESTRIL) 20 MG tablet Take 1 tablet (20 mg total) by mouth daily. 30 tablet 0  . metoprolol tartrate (LOPRESSOR) 25 MG tablet Take 0.5 tablets (12.5 mg total) by mouth 2 (two) times daily. 90 tablet 3  . Multiple Vitamin (DAILY MULTIVITAMIN PO) Take 1 tablet by mouth daily.      . ondansetron (ZOFRAN) 4 MG tablet Take 4 mg by mouth every 6 (six) hours as needed for nausea or vomiting.    [name] DELICA LANCETS FINE MISC check sugar once daily 100 each 2  . pentoxifylline (TRENTAL) 400 MG CR tablet Take 1 tablet (400 mg total) by mouth 3 (  three) times daily with meals. 90 tablet 3  . rosuvastatin (CRESTOR) 40 MG tablet Take 1 tablet (40 mg total) by mouth daily. 90 tablet 2  . SANTYL ointment APPLY DAILY TO WOUND AS DIRECTED  5  . silver  sulfADIAZINE (SILVADENE) 1 % cream Apply 1 application topically daily. Place to left lateral staple incision also place to right lateral foot    . tamsulosin (FLOMAX) 0.4 MG CAPS capsule Take 1 capsule (0.4 mg total) by mouth daily. 90 capsule 3  . vancomycin (VANCOCIN) 125 MG capsule Take 1 capsule (125 mg total) by mouth as directed. 52 capsule 0   No current facility-administered medications on file prior to visit.    Allergies  Allergen Reactions  . Isosorbide Mononitrate Other (See Comments)    Unknown, doesn't remember   . Ramipril Cough  . Quinidine Gluconate Rash   Social History   Socioeconomic History  . Marital status: Married    Spouse name: Not on file  . Number of children: 5  . Years of education: Not on file  . Highest education level: Not on file  Occupational History  . Occupation: [employer]    Employer: RETIRED  Social Needs  . Financial resource strain: Not on file  . Food insecurity:    Worry: Not on file    Inability: Not on file  . Transportation needs:    Medical: Not on file    Non-medical: Not on file  Tobacco Use  . Smoking status: Never Smoker  . Smokeless tobacco: Former Network engineer and Sexual Activity  . Alcohol use: No  . Drug use: No  . Sexual activity: Never  Lifestyle  . Physical activity:    Days per week: Not on file    Minutes per session: Not on file  . Stress: Not on file  Relationships  . Social connections:    Talks on phone: Not on file    Gets together: Not on file    Attends religious service: Not on file    Active member of club or organization: Not on file    Attends meetings of clubs or organizations: Not on file    Relationship status: Not on file  . Intimate partner violence:    Fear of current or ex partner: Not on file    Emotionally abused: Not on file    Physically abused: Not on file    Forced sexual activity: Not on file  Other Topics Concern  . Not on file  Social History Narrative    Retired now does some antiques   Retired from Theatre manager and dye work   Married 1951   5 kids     Review of Systems  Gastrointestinal: Positive for diarrhea.  All other systems reviewed and are negative.      Objective:   Physical Exam  Constitutional: He appears well-developed and well-nourished. No distress.  Eyes: Conjunctivae are normal. Right eye exhibits no discharge. Left eye exhibits no discharge. No scleral icterus.  Neck: Neck supple. No JVD present.  Cardiovascular: Normal rate and normal heart sounds. An irregularly irregular rhythm present.  Pulmonary/Chest: Effort normal and breath sounds normal. No stridor. No respiratory distress. He has no wheezes. He has no rales.  Abdominal: Soft. Bowel sounds are normal. He exhibits no distension. There is no tenderness. There is no guarding.  Musculoskeletal: He exhibits no edema.       Feet:  Lymphadenopathy:    He has no cervical  adenopathy.  Skin: He is not diaphoretic.  Vitals reviewed.       Assessment & Plan:  Diarrhea of infectious origin - Plan: C. difficile GDH and Toxin A/B, Clostridium difficile Toxin B, Qualitative, Real-Time PCR([facility]), saccharomyces boulardii (FLORASTOR) 250 MG capsule  Pre-ulcerative corn or callous  I pared away the thick pre-ulcerative callus using a razor blade down to the underlying viable epidermis.  I applied Silvadene and a bandage.  I recommended rechecking in a week until healed.  I will check his stool for C. difficile toxin AMB as well as a PCR test.  Meanwhile begin Florastor 250 mg daily as a probiotic and also add a fiber supplement.  Hopefully this was just a viral diarrhea and the patient is symptomatically starting to improve.  However obviously if his stool test are positive for C. difficile, we will start the patient back on a prolonged taper of vancomycin.

## 2018-06-10 NOTE — Patient Instructions (Signed)
Do each exercise 2  times per day Do each exercise 10 repetitions Hold each exercise for 3 seconds to feel your location  AT SINK FIND YOUR MIDLINE POSITION AND PLACE FEET EQUAL DISTANCE FROM THE MIDLINE.  USE TAPE ON FLOOR TO MARK THE MIDLINE POSITION. You also should try to feel with your limb pressure in socket.  You are trying to feel with limb what you used to feel with the bottom of your foot.  1. Side to Side Shift: Moving your hips only (not shoulders): move weight onto your left leg, HOLD/FEEL.  Move back to equal weight on each leg, HOLD/FEEL. Move weight onto your right leg, HOLD/FEEL. Move back to equal weight on each leg, HOLD/FEEL. Repeat. 2. Front to Back Shift: Moving your hips only (not shoulders): move your weight forward onto your toes, HOLD/FEEL. Move your weight back to equal Flat Foot on both legs, HOLD/FEEL. Move your weight back onto your heels, HOLD/FEEL. Move your weight back to equal on both legs, HOLD/FEEL. Repeat. 3. Moving Cones / Cups: With equal weight on each leg: Hold on with one hand the first time, then progress to no hand supports. Move cups from one side of sink to the other. Place cups ~2" out of your reach, progress to 10" beyond reach. 4. Overhead/Upward Reaching: alternated reaching up to top cabinets or ceiling if no cabinets present. Keep equal weight on each leg. Start with one hand support on counter while other hand reaches and progress to no hand support with reaching. 5.   Looking Over Shoulders: With equal weight on each leg: alternate turning to look over your shoulders with one hand support on counter as needed. Shift weight to             side looking, pull hip then shoulder then head/eyes around to look behind you. Start with one hand support & progress to no hand support. 

## 2018-06-13 ENCOUNTER — Other Ambulatory Visit: Payer: PPO

## 2018-06-13 ENCOUNTER — Encounter (INDEPENDENT_AMBULATORY_CARE_PROVIDER_SITE_OTHER): Payer: Self-pay | Admitting: Nurse Practitioner

## 2018-06-13 ENCOUNTER — Encounter: Payer: Self-pay | Admitting: Rehabilitation

## 2018-06-13 ENCOUNTER — Ambulatory Visit: Payer: PPO | Admitting: Rehabilitation

## 2018-06-13 DIAGNOSIS — M6281 Muscle weakness (generalized): Secondary | ICD-10-CM

## 2018-06-13 DIAGNOSIS — A09 Infectious gastroenteritis and colitis, unspecified: Secondary | ICD-10-CM | POA: Diagnosis not present

## 2018-06-13 DIAGNOSIS — R2689 Other abnormalities of gait and mobility: Secondary | ICD-10-CM

## 2018-06-13 DIAGNOSIS — R2681 Unsteadiness on feet: Secondary | ICD-10-CM | POA: Diagnosis not present

## 2018-06-13 DIAGNOSIS — R293 Abnormal posture: Secondary | ICD-10-CM

## 2018-06-13 NOTE — Patient Instructions (Addendum)
Abduction: Clam (Eccentric) - Side-Lying    Lie on side with knees bent. Lift top knee, keeping feet together. Keep trunk steady. Slowly lower for 3-5 seconds. _10__ reps per set, _2__ sets per day, _5__ days per week.  Make sure you keep your hip forward.    http://ecce.exer.us/64   Copyright  VHI. All rights reserved.   Bridging    Slowly raise buttocks from floor, keeping stomach tight. Repeat _10___ times per set. Do __2_ sets per session. Do __1-2__ sessions per day.  http://orth.exer.us/1096   Copyright  VHI. All rights reserved.   Hip Flexor Stretch    Lying on back near edge of bed but angle yourself so that you aren't so close to the edge, bend one leg, foot flat. Hang left leg over edge, relaxed, thigh resting entirely on bed for __2__ minutes. Repeat _2___ times. Do __1-2__ sessions per day. Advanced Exercise: Bend knee back keeping thigh in contact with bed.  http://gt2.exer.us/346   Copyright  VHI. All rights reserved.

## 2018-06-13 NOTE — Progress Notes (Signed)
Subjective:    Patient ID: Eric [name]., male    DOB: 1928-03-12, 84 y.o.   MRN: [id number] Chief Complaint  Patient presents with  . Follow-up    8monthabi    HPI  Eric [name] is a 84y.o. male The patient returns to the office for followup and review of the noninvasive studies. There have been no interval changes in lower extremity symptoms. No interval shortening of the patient's claudication distance or development of rest pain symptoms. No new ulcers or wounds have occurred since the last visit.  The patient has a left lower extremity below the knee amputation which is healing well.  The patient states that he has been fitted for his prosthesis and is going to start physical therapy and training for his prosthesis soon.  There have been no significant changes to the patient's overall health care.  The patient denies amaurosis fugax or recent TIA symptoms. There are no recent neurological changes noted. The patient denies history of DVT, PE or superficial thrombophlebitis. The patient denies recent episodes of angina or shortness of breath.   ABI Rt=1.81 and TBI=0.33  (previous ABI's Rt=1.20 and tbi=0.47).  There are biphasic waveforms with strong toe waveforms.   Past Medical History:  Diagnosis Date  . [place]rial [place]utter ([place]    s/p RFCA  . CAD (coronary artery disease)    cath 2003, occluded S-RCA, occluded S-Dx, L-LAD ok, s/p PTCA to LAD  . Cataract   . CKD (chronic kidney disease) stage 3, GFR 30-59 ml/min (HCC)   . Diabetes mellitus    diet controlled  . Hearing aid worn    bilateral  . History of kidney stones   . Long term (current) use of anticoagulants   . Neuromuscular disorder ([place]   . OSA (obstructive sleep apnea) 12/11   very mild, AHI 7/hr  . Persistent atrial fibrillation 09/26/2015  . Pure hyperglyceridemia   . PVD (peripheral vascular disease) ([place]    [name]  . Unspecified  essential hypertension   . Wears dentures    full upper and lower    Past Surgical History:  Procedure Laterality Date  . ABDOMINAL AORTOGRAM W/LOWER EXTREMITY N/A 11/15/2017   Procedure: ABDOMINAL AORTOGRAM W/LOWER EXTREMITY;  Surgeon: FElam Dutch MD;  [facility];  Service: Cardiovascular;  Laterality: N/A;  . Adenosine Myoview  3/06   EF 56%, neg. Ischemia  . Adenosine Myoview  02/18/07   nml  . AMPUTATION Left 12/15/2017   Procedure: AMPUTATION BELOW KNEE;  Surgeon: DAlgernon [name] MD;  Location: [facility] OR[facility];  Service: Vascular;  Laterality: Left;  . ANGIOPLASTY  1/99   CAD- diogonal with rotational artherectomy  . Arthrectomy     of LAD & PTCA  . BLEPHAROPLASTY Bilateral   . CARDIAC CATHETERIZATION  1/00  . CARDIOVERSION  1/04  . CARDIOVERSION  5/07   hospital- a flutter  . CARPAL TUNNEL RELEASE     ? bilateral  . CATARACT EXTRACTION W/PHACO Right 07/28/2017   Procedure: CATARACT EXTRACTION PHACO AND INTRAOCULAR LENS PLACEMENT ([place] DIABETIC;  Surgeon: BLeandrew Koyanagi MD;  [facility]  Service: Ophthalmology;  Laterality: Right;  Diabetic - diet controlled  . CATARACT EXTRACTION W/PHACO Left 08/18/2017   Procedure: CATARACT EXTRACTION PHACO AND INTRAOCULAR LENS PLACEMENT (I[facility]);  Surgeon: BLeandrew Koyanagi MD;  [place] [facility]  Service: Ophthalmology;  Laterality: Left;  DIABETES -  oral meds  . COLONOSCOPY W/ BIOPSIES  10/01/06   sigmoid polyp bx neg, 3 years  . CORONARY ANGIOPLASTY  4/03   cutting balloon PTCA pLAD into Diag  . CORONARY ARTERY BYPASS GRAFT  2000   LIMA-LAD, SVG-RCA, SVG-Diag; SVG-Diag & SVG-RCA occluded 2003  . FRACTURE SURGERY    . HAND SURGERY  08/27/09   R thumb procedure wit Scaphoid Gragt and screws, Dr Kuzma  . NM MYOVIEW LTD  4/11   normal  . PERIPHERAL VASCULAR CATHETERIZATION Right 06/27/2015   Procedure: Lower Extremity Angiography;  Surgeon: Jason S [name], MD;  Location: [facility] IN[facility]  [facility] LAB;  Service: Cardiovascular;  Laterality: Right;  . PERIPHERAL VASCULAR CATHETERIZATION  06/27/2015   Procedure: Lower Extremity Intervention;  Surgeon: Jason S [name], MD;  Location: [facility] LAB;  Service: Cardiovascular;;    Social History   Socioeconomic History  . Marital status: Married    Spouse name: Not on file  . Number of children: 5  . Years of education: Not on file  . Highest education level: Not on file  Occupational History  . Occupation: [employer]    Employer: RETIRED  Social Needs  . Financial resource strain: Not on file  . Food insecurity:    Worry: Not on file    Inability: Not on file  . Transportation needs:    Medical: Not on file    Non-medical: Not on file  Tobacco Use  . Smoking status: Never Smoker  . Smokeless tobacco: Former User  Substance and Sexual Activity  . Alcohol use: No  . Drug use: No  . Sexual activity: Never  Lifestyle  . Physical activity:    Days per week: Not on file    Minutes per session: Not on file  . Stress: Not on file  Relationships  . Social connections:    Talks on phone: Not on file    Gets together: Not on file    Attends religious service: Not on file    Active member of club or organization: Not on file    Attends meetings of clubs or organizations: Not on file    Relationship status: Not on file  . Intimate partner violence:    Fear of current or ex partner: Not on file    Emotionally abused: Not on file    Physically abused: Not on file    Forced sexual activity: Not on file  Other Topics Concern  . Not on file  Social History Narrative   Retired now does some antiques   Retired from tool and dye work   Married 1951   5 kids    Family History  Problem Relation Age of Onset  . Stroke Father   . Aneurysm Father   . Hypertension Father   . Prostate cancer Brother   . Hypertension Mother   . Lung cancer Brother        smoker  . Thrombosis Brother   . Other Brother        RF  valve disorder (smoker)  . Lung cancer Brother        smoker  . Breast cancer Sister   . Liver cancer Brother   . Other Sister        cerebral hemorrhage    Allergies  Allergen Reactions  . Isosorbide Mononitrate Other (See Comments)    Unknown, doesn't remember   . Ramipril Cough  . Quinidine Gluconate Rash     Review   of Systems   Review of Systems: Negative Unless Checked Constitutional: []Weight loss  []Fever  []Chills Cardiac: []Chest pain   [] Atrial Fibrillation  []Palpitations   []Shortness of breath when laying flat   []Shortness of breath with exertion. Vascular:  []Pain in legs with walking   []Pain in legs with standing  []History of DVT   []Phlebitis   []Swelling in legs   []Varicose veins   []Non-healing ulcers Pulmonary:   []Uses home oxygen   []Productive cough   []Hemoptysis   []Wheeze  []COPD   []Asthma Neurologic:  []Dizziness   []Seizures   []History of stroke   []History of TIA  []Aphasia   []Vissual changes   []Weakness or numbness in arm   [x]Weakness or numbness in leg Musculoskeletal:   []Joint swelling   []Joint pain   []Low back pain  [] History of Knee Replacement Hematologic:  []Easy bruising  []Easy bleeding   []Hypercoagulable state   []Anemic Gastrointestinal:  []Diarrhea   []Vomiting  []Gastroesophageal reflux/heartburn   []Difficulty swallowing. Genitourinary:  []Chronic kidney disease   []Difficult urination  []Anuric   []Blood in urine Skin:  []Rashes   []Ulcers  Psychological:  []History of anxiety   [] History of major depression  [] Memory Difficulties     Objective:   Physical Exam  BP (!) 118/55 (BP Location: Right Arm)   Pulse (!) 51   Resp 16   Ht 5' 5.5" (1.664 m)   BMI 26.88 kg/m   Gen: WD/WN, NAD Head: [place]/AT, No temporalis wasting.  Ear/Nose/Throat: Hearing grossly intact, nares w/o erythema or drainage Eyes: PER, EOMI, sclera nonicteric.  Neck: Supple, no masses.  No JVD.  Pulmonary:  Good air movement, no use of accessory  muscles.  Cardiac: RRR Vascular:  Vessel Right Left  Radial Palpable Palpable  Dorsalis Pedis Trace Palpable    Gastrointestinal: soft, non-distended. No guarding/no peritoneal signs.  Musculoskeletal: Left BKA , not yet using prosthetic Neurologic: Pain and light touch intact in extremities.  Symmetrical.  Speech is fluent. Motor exam as listed above. Psychiatric: Judgment intact, Mood & affect appropriate for pt's clinical situation. Dermatologic: No Venous rashes. No Ulcers Noted.  No changes consistent with cellulitis.Well healed incision on left stump.  Lymph : No Cervical lymphadenopathy, no lichenification or skin changes of chronic lymphedema.      Assessment & Plan:   1. PVD (peripheral vascular disease) (HCC)   Recommend:  The patient has evidence of atherosclerosis of the lower extremities.  The patient does not voice lifestyle limiting changes at this point in time.  Noninvasive studies do not suggest clinically significant change.  No invasive studies, angiography or surgery at this time The patient should continue walking and begin a more formal exercise program.  The patient should continue antiplatelet therapy and aggressive treatment of the lipid abnormalities  No changes in the patient's medications at this time  The patient should continue wearing graduated compression socks 10-15 mmHg strength to control the mild edema.   - VAS US ABI WITH/WO TBI; Future - VAS US LOWER EXTREMITY ARTERIAL DUPLEX; Future  2. Essential hypertension Continue antihypertensive medications as already ordered, these medications have been reviewed and there are no changes at this time.   3. HYPERTRIGLYCERIDEMIA Continue statin as ordered and reviewed, no changes at this time     Current Outpatient Medications on File Prior to Visit  Medication Sig Dispense Refill  . acetaminophen (TYLENOL) 325 MG tablet Take 2 tablets (650 mg   total) by mouth every 6 (six) hours as needed  for mild pain (or temp >/= 101 F).    . amLODipine (NORVASC) 10 MG tablet     . apixaban (ELIQUIS) 2.5 MG TABS tablet Take 1 tablet (2.5 mg total) by mouth 2 (two) times daily. 180 tablet 1  . B Complex Vitamins (B COMPLEX-B12) TABS Take 1 tablet by mouth daily.     . Blood Glucose Monitoring Suppl (ONE TOUCH ULTRA SYSTEM KIT) W/DEVICE KIT 1 kit by Does not apply route once. 1 each 0  . calcium carbonate (TUMS - DOSED IN MG ELEMENTAL CALCIUM) 500 MG chewable tablet Chew 1 tablet by mouth daily as needed for indigestion or heartburn.    . cholecalciferol (VITAMIN D) 1000 UNITS tablet Take 1,000 Units by mouth 2 (two) times a week. Takes on Mon and Fri    . Cinnamon 500 MG capsule Take 1,000 mg by mouth 2 (two) times daily.     . feeding supplement, GLUCERNA SHAKE, (GLUCERNA SHAKE) LIQD Take 237 mLs by mouth daily.    . fenofibrate 160 MG tablet Take 1 tablet (160 mg total) by mouth daily. 90 tablet 3  . ferrous sulfate 325 (65 FE) MG EC tablet Take 325 mg by mouth 3 (three) times daily with meals.    . finasteride (PROSCAR) 5 MG tablet TAKE 1 TABLET BY MOUTH ONCE DAILY 90 tablet 3  . fish oil-omega-3 fatty acids 1000 MG capsule Take 1,000 mg by mouth daily.     . glucose blood test strip Use as instructed 100 each 3  . lisinopril (PRINIVIL,ZESTRIL) 20 MG tablet Take 1 tablet (20 mg total) by mouth daily. 30 tablet 0  . metoprolol tartrate (LOPRESSOR) 25 MG tablet Take 0.5 tablets (12.5 mg total) by mouth 2 (two) times daily. 90 tablet 3  . Multiple Vitamin (DAILY MULTIVITAMIN PO) Take 1 tablet by mouth daily.      . ondansetron (ZOFRAN) 4 MG tablet Take 4 mg by mouth every 6 (six) hours as needed for nausea or vomiting.    . ONETOUCH DELICA LANCETS FINE MISC check sugar once daily 100 each 2  . pentoxifylline (TRENTAL) 400 MG CR tablet Take 1 tablet (400 mg total) by mouth 3 (three) times daily with meals. 90 tablet 3  . rosuvastatin (CRESTOR) 40 MG tablet Take 1 tablet (40 mg total) by mouth  daily. 90 tablet 2  . SANTYL ointment APPLY DAILY TO WOUND AS DIRECTED  5  . silver sulfADIAZINE (SILVADENE) 1 % cream Apply 1 application topically daily. Place to left lateral staple incision also place to right lateral foot    . tamsulosin (FLOMAX) 0.4 MG CAPS capsule Take 1 capsule (0.4 mg total) by mouth daily. 90 capsule 3  . vancomycin (VANCOCIN) 125 MG capsule Take 1 capsule (125 mg total) by mouth as directed. 52 capsule 0   No current facility-administered medications on file prior to visit.     There are no Patient Instructions on file for this visit. No follow-ups on file.   Fallon E Brown, NP  This note was completed with Dragon Dictation.  Any errors are purely unintentional.  

## 2018-06-13 NOTE — Therapy (Signed)
[facility] [address] [place], [place], [address] Phone: [phone]   Fax:  [phone]  Physical Therapy Treatment  Patient Details  Name: Eric [name]. MRN: [id number] Date of Birth: 1928/03/02 Referring Provider (PT): Leotis Pain, MD   Encounter Date: 06/13/2018  PT End of Session - 06/13/18 1625    Visit Number  3    Number of Visits  25    Date for PT Re-Evaluation  09/05/18    Authorization Type  [employer]-Needs 10th visit PN    PT Start Time  1104    PT Stop Time  1146    PT Time Calculation (min)  42 min    Equipment Utilized During Treatment  Gait belt    Activity Tolerance  Patient tolerated treatment well    Behavior During Therapy  WFL for tasks assessed/performed       Past Medical History:  Diagnosis Date  . Atrial flutter ([place])    s/p RFCA  . CAD (coronary artery disease)    cath 2003, occluded S-RCA, occluded S-Dx, L-LAD ok, s/p PTCA to LAD  . Cataract   . CKD (chronic kidney disease) stage 3, GFR 30-59 ml/min (HCC)   . Diabetes mellitus    diet controlled  . Hearing aid worn    bilateral  . History of kidney stones   . Long term (current) use of anticoagulants   . Neuromuscular disorder ([place])   . OSA (obstructive sleep apnea) 12/11   very mild, AHI 7/hr  . Persistent atrial fibrillation 09/26/2015  . Pure hyperglyceridemia   . PVD (peripheral vascular disease) ([place])    angioplasty of his right lower extremity in [place] by Dr.[name] 2013  . Unspecified essential hypertension   . Wears dentures    full upper and lower    Past Surgical History:  Procedure Laterality Date  . ABDOMINAL AORTOGRAM W/LOWER EXTREMITY N/A 11/15/2017   Procedure: ABDOMINAL AORTOGRAM W/LOWER EXTREMITY;  Surgeon: [name], MD;  Location: [place] [facility];  Service: Cardiovascular;  Laterality: N/A;  . Adenosine Myoview  3/06   EF 56%, neg. Ischemia  . Adenosine Myoview  02/18/07   nml  . AMPUTATION Left 12/15/2017   Procedure: AMPUTATION BELOW KNEE;  Surgeon: Algernon [name], MD;  Location: [facility]S;  Service: Vascular;  Laterality: Left;  . ANGIOPLASTY  1/99   CAD- diogonal with rotational artherectomy  . Arthrectomy     of LAD & PTCA  . BLEPHAROPLASTY Bilateral   . CARDIAC CATHETERIZATION  1/00  . CARDIOVERSION  1/04  . CARDIOVERSION  5/07   hospital- a flutter  . CARPAL TUNNEL RELEASE     ? bilateral  . CATARACT EXTRACTION W/PHACO Right 07/28/2017   Procedure: CATARACT EXTRACTION PHACO AND INTRAOCULAR LENS PLACEMENT ([place]) RIGHT DIABETIC;  Surgeon: [name], MD;  Location: [place];  Service: Ophthalmology;  Laterality: Right;  Diabetic - diet controlled  . CATARACT EXTRACTION W/PHACO Left 08/18/2017   Procedure: CATARACT EXTRACTION PHACO AND INTRAOCULAR LENS PLACEMENT (I[facility]);  Surgeon: [name], MD;  Location: [place];  Service: Ophthalmology;  Laterality: Left;  DIABETES - oral meds  . COLONOSCOPY W/ BIOPSIES  10/01/06   sigmoid polyp bx neg, 3 years  . CORONARY ANGIOPLASTY  4/03   cutting balloon PTCA pLAD into Diag  . CORONARY ARTERY BYPASS GRAFT  2000   LIMA-LAD, SVG-RCA, SVG-Diag; SVG-Diag & SVG-RCA occluded 2003  . FRACTURE SURGERY    . HAND  SURGERY  08/27/09   R thumb procedure wit Scaphoid Gragt and screws, Dr [name]  . NM MYOVIEW LTD  4/11   normal  . PERIPHERAL VASCULAR CATHETERIZATION Right 06/27/2015   Procedure: Lower Extremity Angiography;  Surgeon: Algernon [name], MD;  Location: [place] [facility];  Service: Cardiovascular;  Laterality: Right;  . PERIPHERAL VASCULAR CATHETERIZATION  06/27/2015   Procedure: Lower Extremity Intervention;  Surgeon: Algernon [name], MD;  Location: [place] [facility];  Service: Cardiovascular;;    There were no vitals filed for this visit.  Subjective Assessment - 06/13/18 1108    Subjective  Pt reports he did wear his prosthesis a little longer than 2 hours on Sunday      Patient is accompained by:  Family member    Pertinent History  L TTA, CAD, A-Fib, CKD3, DM2, PAD, HTN, HLD    Patient Stated Goals  He wants to use prosthesis so can walk & no longer needs his kids to transport & assist.     Currently in Pain?  No/denies                       [facility] Adult PT Treatment/Exercise - 06/13/18 1110      Transfers   Transfers  Sit to Stand;Stand to Sit    Sit to Stand  5: Supervision;4: Min guard;Without upper extremity assist    Sit to Stand Details  Verbal cues for sequencing;Verbal cues for technique;Verbal cues for precautions/safety;Manual facilitation for weight shifting    Sit to Stand Details (indicate cue type and reason)  worked on sit<>stand without UE support (RW placed in front for safety) in order to improve BLE strength and LLE WB.  Pt is able to perform but needs cues for scooting to EOM and for increased forward weight shift and trunk lean with sitting and standing.     Stand to Sit  4: Min guard;5: Supervision    Stand to Sit Details (indicate cue type and reason)  Verbal cues for sequencing;Verbal cues for technique;Verbal cues for precautions/safety;Manual facilitation for weight shifting      Ambulation/Gait   Ambulation/Gait  Yes    Ambulation/Gait Assistance  4: Min guard    Ambulation/Gait Assistance Details  Pt ambulated x 2 laps today with RW and L prosthesis at min/guard level.  Provided cues for increased step width with pt demonstrating marked improvement in gait pattern overall since last two visits.  They have standard walker at home and provided education on getting either front legs to have wheels or getting a RW from [employer] type store so that he can begin walking at home when sons are there to assist.  Pt and family verbalized understanding.  Provided cues for more upright posture, relaxed shoulders and increasing WB through prothesis as able.      Ambulation Distance (Feet)  115 Feet   x 2   Assistive device   Rolling walker;Prosthesis    Gait Pattern  Step-through pattern;Decreased stance time - right;Decreased weight shift to left;Lateral hip instability;Trunk flexed;Narrow base of support;Left flexed knee in stance    Ambulation Surface  Level;Indoor      High Level Balance   High Level Balance Comments  Briefly reviewed sink program standing at counter top.  Provided verbal and visual cue for improved L lateral weight shift and holding x 5 secs then return to midline and then to the R x 5 reps total.  Progressed to L lateral weight shift  to look over L shoulder x 5 reps and L lateral weight shift to reach up into cabinet with LUE x 5 reps.        Exercises   Exercises  Other Exercises    Other Exercises   Initiated B hip strengthening as well as L hip flexor stretch during session and added to HEP, see pt instruction for details.              PT Education - 06/13/18 1624    Education Details  additional strength exercises    Person(s) Educated  Patient    Methods  Explanation    Comprehension  Verbalized understanding       PT Short Term Goals - 06/07/18 1956      PT SHORT TERM GOAL #1   Title  Pt will verbalize/demonstrate understanding of prosthetic care and appropriate wear time/ply sock adjustment to increase independence with functional mobility.  (Target Date: 07/07/18-please make sure and set new STGs once these are checked)    Time  4    Period  Weeks    Status  New    Target Date  07/07/18      PT SHORT TERM GOAL #2   Title  Pt will improve BERG balance test to 20/56 in order to indicate decreased fall risk.      Time  4    Period  Weeks    Status  New      PT SHORT TERM GOAL #3   Title  Pt will tolerate wearing prosthesis >/=60% of awake hours in order to increase pts functional mobility.      Time  4    Period  Weeks    Status  New      PT SHORT TERM GOAL #4   Title  Pt will ambulate x 150' with RW and prosthesis at S level over indoor surfaces in order to  indicate safe household ambulation.     Time  4    Period  Weeks    Status  New      PT SHORT TERM GOAL #5   Title  Pt will improve gait speed w/ LRAD to >/=1.65 ft/sec in order to indicate decreased fall risk.     Time  4    Period  Weeks    Status  New        PT Long Term Goals - 06/07/18 2007      PT LONG TERM GOAL #1   Title  Pt will be independent with HEP along with prosthetic care in order to indicate improved functional mobility and safety with prosthesis.  (Target Date: 09/05/17)    Time  12    Period  Weeks    Status  New    Target Date  09/05/18      PT LONG TERM GOAL #2   Title  Pt will tolerate wearing prosthesis >/=90% of awake hours without negative skin complications.     Time  12    Period  Weeks    Status  New      PT LONG TERM GOAL #3   Title  Pt will ambulate w/ LRAD >500' over indoor and unlevel paved outdoor surfaces at mod I level in order to indicate safe home and limited community negotiation.     Time  12    Period  Weeks    Status  New      PT LONG TERM GOAL #4   Title  Pt will improve BERG balance score to >/=28/56 in order to indicate decreased fall risk.     Time  12    Period  Weeks    Status  New      PT LONG TERM GOAL #5   Title  Pt will improve gait speed to >/=2.25 ft/sec w/ LRAD in order to indicate decreased fall risk.      Time  12    Period  Weeks    Status  New            Plan - 06/13/18 1625    Clinical Impression Statement  Skilled session focused on additional hip strength and flexibility exercises to HEP, gait with RW with education to get RW for home and beginning to ambulate with sons once they have RW, and also ensuring proper technique with lateral weight shift at counter top/sink.      Rehab Potential  Good    Clinical Impairments Affecting Rehab Potential  Pt motivated to progress and become as independent as possible, limited by number of co-morbidities    PT Frequency  2x / week    PT Duration  12 weeks     PT Treatment/Interventions  ADLs/Self Care Home Management;DME Instruction;Gait training;Stair training;Functional mobility training;Therapeutic activities;Therapeutic exercise;Balance training;Neuromuscular re-education;Prosthetic Training;Passive range of motion;Energy conservation;Vestibular    PT Next Visit Plan  Did they get RW yet? Continue to educate on prosthetic wear/care (sock education), provide pt with sink HEP, gait with RW, address B hip abd weakness    Consulted and Agree with Plan of Care  Patient;Family member/caregiver    Family Member Consulted  wife, son       Patient will benefit from skilled therapeutic intervention in order to improve the following deficits and impairments:  Abnormal gait, Decreased activity tolerance, Decreased balance, Decreased endurance, Decreased knowledge of precautions, Decreased knowledge of use of DME, Decreased mobility, Decreased strength, Difficulty walking, Impaired perceived functional ability, Impaired flexibility, Impaired sensation, Prosthetic Dependency, Postural dysfunction  Visit Diagnosis: Unsteadiness on feet  Other abnormalities of gait and mobility  Muscle weakness (generalized)  Abnormal posture     Problem List Patient Active Problem List   Diagnosis Date Noted  . Hx of BKA, left ([place]) 01/13/2018  . Nausea and vomiting in adult 12/26/2017  . Pressure injury of skin 12/23/2017  . C. difficile colitis 12/22/2017  . UTI (urinary tract infection) 12/22/2017  . Hyponatremia 12/22/2017  . Normocytic anemia 12/22/2017  . Chronic anticoagulation 09/26/2015  . Persistent atrial fibrillation 09/26/2015  . PVD (peripheral vascular disease) ([place]) 06/02/2012  . BPH (benign prostatic hyperplasia) 11/16/2011  . AKI (acute kidney injury) ([place]) 08/26/2011  . Coronary artery disease 03/25/2011  . OBSTRUCTIVE SLEEP APNEA 09/15/2010  . PERIODIC LIMB MOVEMENT DISORDER 09/15/2010  . PERSONAL HISTORY OF COLONIC POLYPS 10/25/2009  .  VITAMIN B12 DEFICIENCY 03/04/2009  . HYPERTRIGLYCERIDEMIA 11/24/2006  . Essential hypertension 11/24/2006    Cameron Sprang, PT, MPT [facility] [address] [place], [place], [address] Phone: [phone]   Fax:  [phone] 06/13/18, 4:27 PM  Name: Eric [name]. MRN: [id number] Date of Birth: Jun 06, 1928

## 2018-06-14 ENCOUNTER — Other Ambulatory Visit: Payer: Self-pay | Admitting: Family Medicine

## 2018-06-14 LAB — C. DIFFICILE GDH AND TOXIN A/B
GDH ANTIGEN: DETECTED
MICRO NUMBER: 91324117
SPECIMEN QUALITY:: ADEQUATE
TOXIN A AND B: NOT DETECTED

## 2018-06-14 LAB — CLOSTRIDIUM DIFFICILE TOXIN B, QUALITATIVE, REAL-TIME PCR: Toxigenic C. Difficile by PCR: DETECTED — AB

## 2018-06-14 MED ORDER — VANCOMYCIN HCL 125 MG PO CAPS: 125.0000 mg | ORAL_CAPSULE | ORAL | 0 refills | Status: DC

## 2018-06-16 ENCOUNTER — Ambulatory Visit: Payer: PPO | Admitting: Physical Therapy

## 2018-06-16 ENCOUNTER — Encounter: Payer: Self-pay | Admitting: Physical Therapy

## 2018-06-16 DIAGNOSIS — R2689 Other abnormalities of gait and mobility: Secondary | ICD-10-CM

## 2018-06-16 DIAGNOSIS — R293 Abnormal posture: Secondary | ICD-10-CM

## 2018-06-16 DIAGNOSIS — M6281 Muscle weakness (generalized): Secondary | ICD-10-CM

## 2018-06-16 DIAGNOSIS — R2681 Unsteadiness on feet: Secondary | ICD-10-CM | POA: Diagnosis not present

## 2018-06-17 ENCOUNTER — Encounter: Payer: Self-pay | Admitting: Family Medicine

## 2018-06-17 ENCOUNTER — Ambulatory Visit (INDEPENDENT_AMBULATORY_CARE_PROVIDER_SITE_OTHER): Payer: PPO | Admitting: Family Medicine

## 2018-06-17 VITALS — BP 158/66 | HR 62 | Temp 97.9°F | Resp 16 | Ht 70.0 in | Wt 168.0 lb

## 2018-06-17 DIAGNOSIS — A0472 Enterocolitis due to Clostridium difficile, not specified as recurrent: Secondary | ICD-10-CM

## 2018-06-17 MED ORDER — VANCOMYCIN HCL 125 MG PO CAPS: 125.0000 mg | ORAL_CAPSULE | Freq: Four times a day (QID) | ORAL | 0 refills | Status: DC

## 2018-06-17 NOTE — Progress Notes (Signed)
Subjective:    Patient ID: Eric [name]., male    DOB: 1927/08/23, 84 y.o.   MRN: [id number]  Diarrhea     01/26/18 Patient is here today for hospital discharge follow-up.  When I last saw the patient, he had a necrotic nonhealing ulcer on the left first MTP joint extending over into the left great toe and onto the dorsum of the left foot with dry gangrene.  He been treated with numerous rounds of antibiotics and that also subsequently developed C. difficile colitis.  He was admitted to the hospital and underwent a below the knee amputation of the left leg under the care of [name].  His C. difficile colitis was treated in the hospital with oral vancomycin.  Patient was discharged from the hospital to a skilled nursing facility for rehab.  He was readmitted to the hospital on 2 separate occasions once with dehydration and Pseudomonas UTI and the second time for dehydration and acute kidney injury.  He is here today for follow-up.  I examined the patient's stump.  Staples are still in place.  The operative site is intact.  There is black scab in the lateral aspect of the operative site but there is no wound dehiscence and there is no evidence of cellulitis.  This appears to be a thick dry scab.  He is seeing his vascular surgeon again on Tuesday for another wound check.  He has not been fitted yet for a prosthetic device he is still confined to a wheelchair but he is back home.  He denies any diarrhea.  He has 3-4 bowel movements a day.  They are soft but he denies any watery, melanotic, or foul-smelling stool.  He denies any dysuria fevers chills or urgency.  AT that time, my plan was: Follow-up with [name] as planned.  Once staples have been removed and the patient has been cleared, I would like to have him fitted for a prosthetic device.  We will arrange this if not already done so by his surgeon once he has been surgically cleared.  In the meantime patient is nonweightbearing on his left leg and  is confined to a wheelchair.  I would like to repeat a urine sample to ensure complete resolution of his Pseudomonas UTI.  I would obtain a CBC to monitor for anemia postoperatively.  I will also check a CMP to evaluate for any electrolyte disturbances or evidence of dehydration.  Patient is still taking a probiotic which given his recent severe C. difficile colitis I believe is a good idea.  The wound was dressed today with Silvadene over the operative site, covered with nonadherent gauze, and then wrapping Coban.  02/24/18 Patient has a history of C. difficile colitis prior to his hospitalization.  He states that 10 days ago he started developing watery diarrhea.  Over the last 2 to 3 days the diarrhea has become constant.  He is now having loose bowel movements every 1-2 hours at home.  The majority the time is just water.  He denies any fevers or chills or abdominal pain however the diarrhea is extremely foul-smelling.  He is concerned that this could be a recurrence.  He denies any recent travel.  He denies any medication changes.  He denies any sick contacts.  At that time, my plan was: Given his previous history, I am concerned about C. difficile colitis as well.  Given his frailty and his advanced age, I do not want to delay treatment.  I will send him home with stool collection kit tonight to perform a GI pathogen panel as well as to evaluate for C. difficile toxin A and B.  As soon as he obtains the specimen, I want him to start vancomycin 125 mg p.o. 4 times daily for at least 14 days.  We may need to perform a prolonged taper if in fact this is C. difficile colitis.  Recommended that he push Gatorade to avoid dehydration and recheck on Monday  06/10/18 Patient was ultimately discovered to have C. difficile colitis.  He completed a prolonged taper of vancomycin as documented in his chart.  Been off the medication now for 2 months and has had no further diarrhea.  Approximately 2 weeks ago, he started  developing watery diarrhea.  He states that he will go to the bathroom 4-6 times in the day.  Occasionally he would have fecal incontinence.  He reports watery stool.  He denies any blood in the stool.  He denies any fevers or chills or abdominal pain.  Over the last few days however the diarrhea has lessened and today he has had no episodes of diarrhea.  Yesterday he only had 2 episodes.  He is very concerned however about possible re-exposure to C. difficile.  Denies any travel.  He denies any sick contacts.  He also reports a sore forming on the plantar aspect of his right fifth MTP joint.  On visual inspection there is a 2 cm pre-ulcerative callus on the plantar aspect of the fifth MTP joint.  At that time, my plan was: I pared away the thick pre-ulcerative callus using a razor blade down to the underlying viable epidermis.  I applied Silvadene and a bandage.  I recommended rechecking in a week until healed.  I will check his stool for C. difficile toxin AMB as well as a PCR test.  Meanwhile begin Florastor 250 mg daily as a probiotic and also add a fiber supplement.  Hopefully this was just a viral diarrhea and the patient is symptomatically starting to improve.  However obviously if his stool test are positive for C. difficile, we will start the patient back on a prolonged taper of vancomycin.  06/17/18 Labs again confirmed C. difficile colitis unfortunately.  Patient begin a prolonged taper of vancomycin.  He is currently taking 125 mg 4 times daily for 14 days.  He will then be switched to 125 mg twice a day for a week, then daily for a week, then every other day until the prescription runs out which should be an additional 3 weeks.  He states that the diarrhea has already started to improve.  He denies any fevers chills or abdominal pain.  Today he states that his stomach feels almost back to normal.  He is also here to re-address the pre-ulcerative callus on the plantar aspect of his right fifth MTP  joint.  Today is approximately 1.5 cm in diameter.  There is less hyperkeratotic material.  There is no skin breakdown and there is no erythema. Past Medical History:  Diagnosis Date  . Atrial flutter ([place])    s/p RFCA  . CAD (coronary artery disease)    cath 2003, occluded S-RCA, occluded S-Dx, L-LAD ok, s/p PTCA to LAD  . Cataract   . CKD (chronic kidney disease) stage 3, GFR 30-59 ml/min ([facility])   . Diabetes mellitus    diet controlled  . Hearing aid worn    bilateral  . History of kidney stones   .  Long term (current) use of anticoagulants   . Neuromuscular disorder ([place])   . OSA (obstructive sleep apnea) 12/11   very mild, AHI 7/hr  . Persistent atrial fibrillation 09/26/2015  . Pure hyperglyceridemia   . PVD (peripheral vascular disease) ([place])    angioplasty of his right lower extremity in [place] by Dr.[name] 2013  . Unspecified essential hypertension   . Wears dentures    full upper and lower   Past Surgical History:  Procedure Laterality Date  . ABDOMINAL AORTOGRAM W/LOWER EXTREMITY N/A 11/15/2017   Procedure: ABDOMINAL AORTOGRAM W/LOWER EXTREMITY;  Surgeon: [name], MD;  Location: [facility];  Service: Cardiovascular;  Laterality: N/A;  . Adenosine Myoview  3/06   EF 56%, neg. Ischemia  . Adenosine Myoview  02/18/07   nml  . AMPUTATION Left 12/15/2017   Procedure: AMPUTATION BELOW KNEE;  Surgeon: Algernon [name], MD;  Location: [facility];  Service: Vascular;  Laterality: Left;  . ANGIOPLASTY  1/99   CAD- diogonal with rotational artherectomy  . Arthrectomy     of LAD & PTCA  . BLEPHAROPLASTY Bilateral   . CARDIAC CATHETERIZATION  1/00  . CARDIOVERSION  1/04  . CARDIOVERSION  5/07   hospital- a flutter  . CARPAL TUNNEL RELEASE     ? bilateral  . CATARACT EXTRACTION W/PHACO Right 07/28/2017   Procedure: CATARACT EXTRACTION PHACO AND INTRAOCULAR LENS PLACEMENT ([place]) RIGHT DIABETIC;  Surgeon: [name], MD;  Location: [facility];   Service: Ophthalmology;  Laterality: Right;  Diabetic - diet controlled  . CATARACT EXTRACTION W/PHACO Left 08/18/2017   Procedure: CATARACT EXTRACTION PHACO AND INTRAOCULAR LENS PLACEMENT ([facility]);  Surgeon: [name], MD;  Location: [facility];  Service: Ophthalmology;  Laterality: Left;  DIABETES - oral meds  . COLONOSCOPY W/ BIOPSIES  10/01/06   sigmoid polyp bx neg, 3 years  . CORONARY ANGIOPLASTY  4/03   cutting balloon PTCA pLAD into Diag  . CORONARY ARTERY BYPASS GRAFT  2000   LIMA-LAD, SVG-RCA, SVG-Diag; SVG-Diag & SVG-RCA occluded 2003  . FRACTURE SURGERY    . HAND SURGERY  08/27/09   R thumb procedure wit Scaphoid Gragt and screws, Dr [name]  . NM MYOVIEW LTD  4/11   normal  . PERIPHERAL VASCULAR CATHETERIZATION Right 06/27/2015   Procedure: Lower Extremity Angiography;  Surgeon: Algernon [name], MD;  Location: [facility];  Service: Cardiovascular;  Laterality: Right;  . PERIPHERAL VASCULAR CATHETERIZATION  06/27/2015   Procedure: Lower Extremity Intervention;  Surgeon: Algernon [name], MD;  Location: [facility];  Service: Cardiovascular;;   Current Outpatient Medications on File Prior to Visit  Medication Sig Dispense Refill  . acetaminophen (TYLENOL) 325 MG tablet Take 2 tablets (650 mg total) by mouth every 6 (six) hours as needed for mild pain (or temp >/= 101 F).    [name] amLODipine (NORVASC) 10 MG tablet     . apixaban (ELIQUIS) 2.5 MG TABS tablet Take 1 tablet (2.5 mg total) by mouth 2 (two) times daily. 180 tablet 1  . B Complex Vitamins (B COMPLEX-B12) TABS Take 1 tablet by mouth daily.     . Blood Glucose Monitoring Suppl (ONE TOUCH ULTRA SYSTEM KIT) W/DEVICE KIT 1 kit by Does not apply route once. 1 each 0  . calcium carbonate (TUMS - DOSED IN MG ELEMENTAL CALCIUM) 500 MG chewable tablet Chew 1 tablet by mouth daily as needed for indigestion or heartburn.    . cholecalciferol (VITAMIN D)  1000 UNITS tablet Take 1,000 Units by mouth 2 (two) times a  week. Takes on Mon and Fri    . Cinnamon 500 MG capsule Take 1,000 mg by mouth 2 (two) times daily.     . feeding supplement, GLUCERNA SHAKE, (GLUCERNA SHAKE) LIQD Take 237 mLs by mouth daily.    . fenofibrate 160 MG tablet Take 1 tablet (160 mg total) by mouth daily. 90 tablet 3  . ferrous sulfate 325 (65 FE) MG EC tablet Take 325 mg by mouth 3 (three) times daily with meals.    . finasteride (PROSCAR) 5 MG tablet TAKE 1 TABLET BY MOUTH ONCE DAILY 90 tablet 3  . fish oil-omega-3 fatty acids 1000 MG capsule Take 1,000 mg by mouth daily.     [name] glucose blood test strip Use as instructed 100 each 3  . lisinopril (PRINIVIL,ZESTRIL) 20 MG tablet Take 1 tablet (20 mg total) by mouth daily. 30 tablet 0  . metoprolol tartrate (LOPRESSOR) 25 MG tablet Take 0.5 tablets (12.5 mg total) by mouth 2 (two) times daily. 90 tablet 3  . Multiple Vitamin (DAILY MULTIVITAMIN PO) Take 1 tablet by mouth daily.      . ondansetron (ZOFRAN) 4 MG tablet Take 4 mg by mouth every 6 (six) hours as needed for nausea or vomiting.    [name] DELICA LANCETS FINE MISC check sugar once daily 100 each 2  . pentoxifylline (TRENTAL) 400 MG CR tablet Take 1 tablet (400 mg total) by mouth 3 (three) times daily with meals. 90 tablet 3  . rosuvastatin (CRESTOR) 40 MG tablet Take 1 tablet (40 mg total) by mouth daily. 90 tablet 2  . saccharomyces boulardii (FLORASTOR) 250 MG capsule Take 1 capsule (250 mg total) by mouth daily. 90 capsule 3  . SANTYL ointment APPLY DAILY TO WOUND AS DIRECTED  5  . silver sulfADIAZINE (SILVADENE) 1 % cream Apply 1 application topically daily. Place to left lateral staple incision also place to right lateral foot    . tamsulosin (FLOMAX) 0.4 MG CAPS capsule Take 1 capsule (0.4 mg total) by mouth daily. 90 capsule 3  . vancomycin (VANCOCIN) 125 MG capsule Take 1 capsule (125 mg total) by mouth as directed. 52 capsule 0   No current facility-administered medications on file prior to visit.    Allergies    Allergen Reactions  . Isosorbide Mononitrate Other (See Comments)    Unknown, doesn't remember   . Ramipril Cough  . Quinidine Gluconate Rash   Social History   Socioeconomic History  . Marital status: Married    Spouse name: Not on file  . Number of children: 5  . Years of education: Not on file  . Highest education level: Not on file  Occupational History  . Occupation: [employer]    Employer: RETIRED  Social Needs  . Financial resource strain: Not on file  . Food insecurity:    Worry: Not on file    Inability: Not on file  . Transportation needs:    Medical: Not on file    Non-medical: Not on file  Tobacco Use  . Smoking status: Never Smoker  . Smokeless tobacco: Former Network engineer and Sexual Activity  . Alcohol use: No  . Drug use: No  . Sexual activity: Never  Lifestyle  . Physical activity:    Days per week: Not on file    Minutes per session: Not on file  . Stress: Not on file  Relationships  .  Social connections:    Talks on phone: Not on file    Gets together: Not on file    Attends religious service: Not on file    Active member of club or organization: Not on file    Attends meetings of clubs or organizations: Not on file    Relationship status: Not on file  . Intimate partner violence:    Fear of current or ex partner: Not on file    Emotionally abused: Not on file    Physically abused: Not on file    Forced sexual activity: Not on file  Other Topics Concern  . Not on file  Social History Narrative   Retired now does some antiques   Retired from Theatre manager and dye work   Married 1951   5 kids     Review of Systems  Gastrointestinal: Positive for diarrhea.  All other systems reviewed and are negative.      Objective:   Physical Exam  Constitutional: He appears well-developed and well-nourished. No distress.  Eyes: Conjunctivae are normal. Right eye exhibits no discharge. Left eye exhibits no discharge. No scleral icterus.  Neck:  Neck supple. No JVD present.  Cardiovascular: Normal rate and normal heart sounds. An irregularly irregular rhythm present.  Pulmonary/Chest: Effort normal and breath sounds normal. No stridor. No respiratory distress. He has no wheezes. He has no rales.  Abdominal: Soft. Bowel sounds are normal. He exhibits no distension. There is no tenderness. There is no guarding.  Musculoskeletal: He exhibits no edema.       Feet:  Lymphadenopathy:    He has no cervical adenopathy.  Skin: He is not diaphoretic.  Vitals reviewed.       Assessment & Plan:  C. difficile colitis  Vancomycin 125 mg p.o. 4 times daily for 14 days, then twice daily for 7 days, then every day for 7 days, then every other day for 2 to 3 weeks.  If this returns, we may need to add rifaximin.  I again pared down the hyperkeratotic tissue from the plantar surface of the MTP joint.  The wound was dressed and wound care was discussed.

## 2018-06-17 NOTE — Therapy (Signed)
[facility] [address] [place], [place], [address] Phone: [phone]   Fax:  [phone]  Physical Therapy Treatment  Patient Details  Name: Eric [name]. MRN: [id number] Date of Birth: [birth year] Referring Provider (PT): Leotis Pain, MD   Encounter Date: 06/16/2018  PT End of Session - 06/16/18 1900    Visit Number  4    Number of Visits  25    Date for PT Re-Evaluation  09/05/18    Authorization Type  [employer]-Needs 10th visit PN    PT Start Time  0930    PT Stop Time  1015    PT Time Calculation (min)  45 min    Equipment Utilized During Treatment  Gait belt    Activity Tolerance  Patient tolerated treatment well    Behavior During Therapy  WFL for tasks assessed/performed       Past Medical History:  Diagnosis Date  . Atrial flutter ([place])    s/p RFCA  . CAD (coronary artery disease)    cath 2003, occluded S-RCA, occluded S-Dx, L-LAD ok, s/p PTCA to LAD  . Cataract   . CKD (chronic kidney disease) stage 3, GFR 30-59 ml/min (HCC)   . Diabetes mellitus    diet controlled  . Hearing aid worn    bilateral  . History of kidney stones   . Long term (current) use of anticoagulants   . Neuromuscular disorder ([place])   . OSA (obstructive sleep apnea) 12/11   very mild, AHI 7/hr  . Persistent atrial fibrillation 09/26/2015  . Pure hyperglyceridemia   . PVD (peripheral vascular disease) ([place])    angioplasty of his right lower extremity in [place] by Dr.[name] 2013  . Unspecified essential hypertension   . Wears dentures    full upper and lower    Past Surgical History:  Procedure Laterality Date  . ABDOMINAL AORTOGRAM W/LOWER EXTREMITY N/A 11/15/2017   Procedure: ABDOMINAL AORTOGRAM W/LOWER EXTREMITY;  Surgeon: [name], MD;  Location: [facility];  Service: Cardiovascular;  Laterality: N/A;  . Adenosine Myoview  3/06   EF 56%, neg. Ischemia  . Adenosine Myoview  02/18/07   nml  . AMPUTATION Left 12/15/2017   Procedure: AMPUTATION BELOW KNEE;  Surgeon: Algernon [name], MD;  Location: [facility];  Service: Vascular;  Laterality: Left;  . ANGIOPLASTY  1/99   CAD- diogonal with rotational artherectomy  . Arthrectomy     of LAD & PTCA  . BLEPHAROPLASTY Bilateral   . CARDIAC CATHETERIZATION  1/00  . CARDIOVERSION  1/04  . CARDIOVERSION  5/07   hospital- a flutter  . CARPAL TUNNEL RELEASE     ? bilateral  . CATARACT EXTRACTION W/PHACO Right 07/28/2017   Procedure: CATARACT EXTRACTION PHACO AND INTRAOCULAR LENS PLACEMENT ([place]) RIGHT DIABETIC;  Surgeon: [name], MD;  Location: [facility];  Service: Ophthalmology;  Laterality: Right;  Diabetic - diet controlled  . CATARACT EXTRACTION W/PHACO Left 08/18/2017   Procedure: CATARACT EXTRACTION PHACO AND INTRAOCULAR LENS PLACEMENT ([facility]);  Surgeon: [name], MD;  Location: [facility];  Service: Ophthalmology;  Laterality: Left;  DIABETES - oral meds  . COLONOSCOPY W/ BIOPSIES  10/01/06   sigmoid polyp bx neg, 3 years  . CORONARY ANGIOPLASTY  4/03   cutting balloon PTCA pLAD into Diag  . CORONARY ARTERY BYPASS GRAFT  2000   LIMA-LAD, SVG-RCA, SVG-Diag; SVG-Diag & SVG-RCA occluded 2003  . FRACTURE SURGERY    . HAND  SURGERY  08/27/09   R thumb procedure wit Scaphoid Gragt and screws, Dr [name]  . NM MYOVIEW LTD  4/11   normal  . PERIPHERAL VASCULAR CATHETERIZATION Right 06/27/2015   Procedure: Lower Extremity Angiography;  Surgeon: Algernon [name], MD;  Location: [facility];  Service: Cardiovascular;  Laterality: Right;  . PERIPHERAL VASCULAR CATHETERIZATION  06/27/2015   Procedure: Lower Extremity Intervention;  Surgeon: Algernon [name], MD;  Location: [facility];  Service: Cardiovascular;;    There were no vitals filed for this visit.  Subjective Assessment - 06/16/18 0937    Subjective  He is wearing prosthesis 3 hrs 2x/day. No issues.    Patient is accompained by:   Family member    Pertinent History  L TTA, CAD, A-Fib, CKD3, DM2, PAD, HTN, HLD    Patient Stated Goals  He wants to use prosthesis so can walk & no longer needs his kids to transport & assist.     Currently in Pain?  No/denies                       [facility] Adult PT Treatment/Exercise - 06/16/18 0930      Transfers   Transfers  Sit to Stand;Stand to Sit    Sit to Stand  5: Supervision;With armrests;With upper extremity assist;From chair/3-in-1   to RW   Sit to Stand Details  Verbal cues for sequencing;Verbal cues for technique;Verbal cues for precautions/safety;Manual facilitation for weight shifting    Stand to Sit  5: Supervision;With upper extremity assist;With armrests;To chair/3-in-1   from RW   Stand to Sit Details (indicate cue type and reason)  Verbal cues for sequencing;Verbal cues for technique;Verbal cues for precautions/safety;Manual facilitation for weight shifting      Ambulation/Gait   Ambulation/Gait  Yes    Ambulation/Gait Assistance  4: Min guard;5: Supervision    Ambulation/Gait Assistance Details  visual & verbal cues on step length/step through with fluent RW movement and step width with theraband markers    Ambulation Distance (Feet)  150 Feet   150' x 2   Assistive device  Rolling walker;Prosthesis    Gait Pattern  Step-through pattern;Decreased stance time - right;Decreased weight shift to left;Lateral hip instability;Trunk flexed;Narrow base of support;Left flexed knee in stance    Ambulation Surface  Indoor;Level    Stairs  Yes    Stairs Assistance  5: Supervision    Stairs Assistance Details (indicate cue type and reason)  PT verbal & visual cues on sequence & technique    Stair Management Technique  Two rails;Step to pattern;Forwards    Number of Stairs  4    Height of Stairs  6    Ramp  4: Min assist   RW & prosthesis   Ramp Details (indicate cue type and reason)  demo & verbal cues on technique with upright posture & wt shift    Curb  4:  Min assist   RW & prosthesis   Curb Details (indicate cue type and reason)  demo & verbal cues in technique / sequence      High Level Balance   High Level Balance Comments  --      Exercises   Exercises  --    Other Exercises   --      Prosthetics   Prosthetic Care Comments   Increase wear to 4hrs 2x/day drying limb/liner half way.     Current prosthetic wear tolerance (days/week)   daily  Current prosthetic wear tolerance (#hours/day)   3 hrs 2x/day    Edema  pitting    Residual limb condition   4 dry scabs on incision, no hair growth, shiny skin, normal color & temperature, cylinderical shape    Education Provided  Skin check;Residual limb care;Proper Donning;Proper wear schedule/adjustment    Person(s) Educated  Patient;Spouse;Child(ren)    Education Method  Explanation;Demonstration;Tactile cues;Verbal cues    Education Method  Verbalized understanding;Tactile cues required;Returned demonstration;Verbal cues required;Needs further instruction    Donning Prosthesis  Moderate assist             PT Education - 06/16/18 0954    Education Details  getting wheel attachments for his std 4 footed walker    Person(s) Educated  Patient;Spouse;Child(ren)    Methods  Explanation;Verbal cues    Comprehension  Verbalized understanding;Verbal cues required;Need further instruction       PT Short Term Goals - 06/16/18 1948      PT SHORT TERM GOAL #1   Title  Pt will verbalize/demonstrate understanding of prosthetic care and appropriate wear time/ply sock adjustment to increase independence with functional mobility.  (Target Date: 07/07/18-please make sure and set new STGs once these are checked)    Time  4    Period  Weeks    Status  On-going    Target Date  07/07/18      PT SHORT TERM GOAL #2   Title  Pt will improve BERG balance test to 20/56 in order to indicate decreased fall risk.      Time  4    Period  Weeks    Status  On-going    Target Date  07/07/18      PT  SHORT TERM GOAL #3   Title  Pt will tolerate wearing prosthesis >/=60% of awake hours in order to increase pts functional mobility.      Time  4    Period  Weeks    Status  On-going    Target Date  07/07/18      PT SHORT TERM GOAL #4   Title  Pt will ambulate x 150' with RW and prosthesis at S level over indoor surfaces in order to indicate safe household ambulation.     Time  4    Period  Weeks    Status  On-going    Target Date  07/07/18      PT SHORT TERM GOAL #5   Title  Pt will improve gait speed w/ LRAD to >/=1.65 ft/sec in order to indicate decreased fall risk.     Time  4    Period  Weeks    Status  On-going    Target Date  07/07/18        PT Long Term Goals - 06/16/18 1949      PT LONG TERM GOAL #1   Title  Pt will be independent with HEP along with prosthetic care in order to indicate improved functional mobility and safety with prosthesis.  (Target Date: 09/05/17)    Time  12    Period  Weeks    Status  On-going    Target Date  09/05/17      PT LONG TERM GOAL #2   Title  Pt will tolerate wearing prosthesis >/=90% of awake hours without negative skin complications.     Time  12    Period  Weeks    Status  On-going    Target Date  09/05/17  PT LONG TERM GOAL #3   Title  Pt will ambulate w/ LRAD >500' over indoor and unlevel paved outdoor surfaces at mod I level in order to indicate safe home and limited community negotiation.     Time  12    Period  Weeks    Status  On-going    Target Date  09/05/17      PT LONG TERM GOAL #4   Title  Pt will improve BERG balance score to >/=28/56 in order to indicate decreased fall risk.     Time  12    Period  Weeks    Status  On-going    Target Date  09/05/17      PT LONG TERM GOAL #5   Title  Pt will improve gait speed to >/=2.25 ft/sec w/ LRAD in order to indicate decreased fall risk.      Time  12    Period  Weeks    Status  On-going    Target Date  09/05/17            Plan - 06/16/18 1950     Clinical Impression Statement  Patient improved prosthetic gait with rolling walker with instruction in step length/step through & step width and how to negotiate barriers. His limb has scabs still but are not increasing with limited wear.    Rehab Potential  Good    Clinical Impairments Affecting Rehab Potential  Pt motivated to progress and become as independent as possible, limited by number of co-morbidities    PT Frequency  2x / week    PT Duration  12 weeks    PT Treatment/Interventions  ADLs/Self Care Home Management;DME Instruction;Gait training;Stair training;Functional mobility training;Therapeutic activities;Therapeutic exercise;Balance training;Neuromuscular re-education;Prosthetic Training;Passive range of motion;Energy conservation;Vestibular    PT Next Visit Plan  Continue to educate on prosthetic wear/care (sock education, pt to bring his socks to next session), provide pt with sink HEP, gait with RW, address B hip abd weakness    Consulted and Agree with Plan of Care  Patient;Family member/caregiver    Family Member Consulted  wife, son       Patient will benefit from skilled therapeutic intervention in order to improve the following deficits and impairments:  Abnormal gait, Decreased activity tolerance, Decreased balance, Decreased endurance, Decreased knowledge of precautions, Decreased knowledge of use of DME, Decreased mobility, Decreased strength, Difficulty walking, Impaired perceived functional ability, Impaired flexibility, Impaired sensation, Prosthetic Dependency, Postural dysfunction  Visit Diagnosis: Unsteadiness on feet  Other abnormalities of gait and mobility  Muscle weakness (generalized)  Abnormal posture     Problem List Patient Active Problem List   Diagnosis Date Noted  . Hx of BKA, left ([place]) 01/13/2018  . Nausea and vomiting in adult 12/26/2017  . Pressure injury of skin 12/23/2017  . C. difficile colitis 12/22/2017  . UTI (urinary tract  infection) 12/22/2017  . Hyponatremia 12/22/2017  . Normocytic anemia 12/22/2017  . Chronic anticoagulation 09/26/2015  . Persistent atrial fibrillation 09/26/2015  . PVD (peripheral vascular disease) ([place]) 06/02/2012  . BPH (benign prostatic hyperplasia) 11/16/2011  . AKI (acute kidney injury) ([place]) 08/26/2011  . Coronary artery disease 03/25/2011  . OBSTRUCTIVE SLEEP APNEA 09/15/2010  . PERIODIC LIMB MOVEMENT DISORDER 09/15/2010  . PERSONAL HISTORY OF COLONIC POLYPS 10/25/2009  . VITAMIN B12 DEFICIENCY 03/04/2009  . HYPERTRIGLYCERIDEMIA 11/24/2006  . Essential hypertension 11/24/2006    Carson Bogden PT, DPT 06/17/2018, 10:03 AM  [facility] [address]  [place], [place], [address] Phone: [phone]   Fax:  [phone]  Name: Eric [name]. MRN: [id number] Date of Birth: Jun 14, 1928

## 2018-06-21 ENCOUNTER — Ambulatory Visit: Payer: PPO | Admitting: Physical Therapy

## 2018-06-21 DIAGNOSIS — R2681 Unsteadiness on feet: Secondary | ICD-10-CM | POA: Diagnosis not present

## 2018-06-21 DIAGNOSIS — R293 Abnormal posture: Secondary | ICD-10-CM

## 2018-06-21 DIAGNOSIS — M6281 Muscle weakness (generalized): Secondary | ICD-10-CM

## 2018-06-21 DIAGNOSIS — R2689 Other abnormalities of gait and mobility: Secondary | ICD-10-CM

## 2018-06-21 NOTE — Therapy (Signed)
[facility] [address] [place], [place], [address] Phone: [phone]   Fax:  [phone]  Physical Therapy Treatment  Patient Details  Name: Eric [name]. MRN: [id number] Date of Birth: [birth year] Referring Provider (PT): Leotis Pain, MD   Encounter Date: 06/21/2018  PT End of Session - 06/21/18 0946    Visit Number  5    Number of Visits  25    Date for PT Re-Evaluation  09/05/18    Authorization Type  [employer]-Needs 10th visit PN    PT Start Time  0845    PT Stop Time  0929    PT Time Calculation (min)  44 min    Activity Tolerance  Patient tolerated treatment well    Behavior During Therapy  [facility] for tasks assessed/performed       Past Medical History:  Diagnosis Date  . Atrial flutter ([place])    s/p RFCA  . CAD (coronary artery disease)    cath 2003, occluded S-RCA, occluded S-Dx, L-LAD ok, s/p PTCA to LAD  . Cataract   . CKD (chronic kidney disease) stage 3, GFR 30-59 ml/min (HCC)   . Diabetes mellitus    diet controlled  . Hearing aid worn    bilateral  . History of kidney stones   . Long term (current) use of anticoagulants   . Neuromuscular disorder ([place])   . OSA (obstructive sleep apnea) 12/11   very mild, AHI 7/hr  . Persistent atrial fibrillation 09/26/2015  . Pure hyperglyceridemia   . PVD (peripheral vascular disease) ([place])    angioplasty of his right lower extremity in [place] by Dr.[name] 2013  . Unspecified essential hypertension   . Wears dentures    full upper and lower    Past Surgical History:  Procedure Laterality Date  . ABDOMINAL AORTOGRAM W/LOWER EXTREMITY N/A 11/15/2017   Procedure: ABDOMINAL AORTOGRAM W/LOWER EXTREMITY;  Surgeon: [name], MD;  Location: [facility];  Service: Cardiovascular;  Laterality: N/A;  . Adenosine Myoview  3/06   EF 56%, neg. Ischemia  . Adenosine Myoview  02/18/07   nml  . AMPUTATION Left 12/15/2017   Procedure:  AMPUTATION BELOW KNEE;  Surgeon: Algernon [name], MD;  Location: [facility];  Service: Vascular;  Laterality: Left;  . ANGIOPLASTY  1/99   CAD- diogonal with rotational artherectomy  . Arthrectomy     of LAD & PTCA  . BLEPHAROPLASTY Bilateral   . CARDIAC CATHETERIZATION  1/00  . CARDIOVERSION  1/04  . CARDIOVERSION  5/07   hospital- a flutter  . CARPAL TUNNEL RELEASE     ? bilateral  . CATARACT EXTRACTION W/PHACO Right 07/28/2017   Procedure: CATARACT EXTRACTION PHACO AND INTRAOCULAR LENS PLACEMENT ([place]) RIGHT DIABETIC;  Surgeon: [name], MD;  Location: [facility];  Service: Ophthalmology;  Laterality: Right;  Diabetic - diet controlled  . CATARACT EXTRACTION W/PHACO Left 08/18/2017   Procedure: CATARACT EXTRACTION PHACO AND INTRAOCULAR LENS PLACEMENT ([facility]);  Surgeon: [name], MD;  Location: [facility];  Service: Ophthalmology;  Laterality: Left;  DIABETES - oral meds  . COLONOSCOPY W/ BIOPSIES  10/01/06   sigmoid polyp bx neg, 3 years  . CORONARY ANGIOPLASTY  4/03   cutting balloon PTCA pLAD into Diag  . CORONARY ARTERY BYPASS GRAFT  2000   LIMA-LAD, SVG-RCA, SVG-Diag; SVG-Diag & SVG-RCA occluded 2003  . FRACTURE SURGERY    . HAND SURGERY  08/27/09   R thumb procedure wit  Scaphoid Gragt and screws, Dr [name]  . NM MYOVIEW LTD  4/11   normal  . PERIPHERAL VASCULAR CATHETERIZATION Right 06/27/2015   Procedure: Lower Extremity Angiography;  Surgeon: Algernon [name], MD;  Location: [facility];  Service: Cardiovascular;  Laterality: Right;  . PERIPHERAL VASCULAR CATHETERIZATION  06/27/2015   Procedure: Lower Extremity Intervention;  Surgeon: Algernon [name], MD;  Location: [facility];  Service: Cardiovascular;;    There were no vitals filed for this visit.  Subjective Assessment - 06/21/18 0839    Subjective  He is wearing prosthesis 4-5 hours x2 a day. He stated he is not cleaning/drying as much as he should. He feels he should wear more  socks and is currently only wearing one-ply.    Patient is accompained by:  Family member    Pertinent History  L TTA, CAD, A-Fib, CKD3, DM2, PAD, HTN, HLD    Patient Stated Goals  He wants to use prosthesis so can walk & no longer needs his kids to transport & assist.     Currently in Pain?  No/denies        06/21/18 0834  Transfers  Transfers Sit to Stand;Stand to Sit (transfer with WC; transfer to/from chair without armrests)  Sit to Stand 5: Supervision;With armrests;With upper extremity assist;From chair/3-in-1 (to RW, chairs with & without armrests)  Sit to Stand Details Verbal cues for technique;Verbal cues for sequencing;Verbal cues for precautions/safety;Other (comment) (Demonstration for transfer from chair without armrests )  Stand to Sit 5: Supervision;With upper extremity assist;With armrests;To chair/3-in-1 (from RW, chairs with & without armrests)  Stand to Sit Details (indicate cue type and reason) Verbal cues for sequencing;Verbal cues for technique;Verbal cues for precautions/safety  Comments Patient performed 2 reps from chair with out armrests to increase patient independence. Verbal cues given to secure chair during descent were given.  Ambulation/Gait  Ambulation/Gait Yes  Ambulation/Gait Assistance 4: Min guard;5: Supervision  Ambulation/Gait Assistance Details Verbal cues givenfor step length, dereased speed when negotiatiing turns for safety, and theraband markers for step alignment. Patient demonstrated 2x toe catch but was able to correct without assistance.  Ambulation Distance (Feet) 230 Feet (40' X 3, 230' X 1)  Assistive device Rolling walker;Prosthesis  Gait Pattern Step-through pattern;Decreased stance time - right;Decreased weight shift to left;Lateral hip instability;Trunk flexed;Narrow base of support;Left flexed knee in stance  Ambulation Surface Level;Indoor  Stairs Yes  Stairs Assistance 5: Supervision;4: Min guard  Stairs Assistance Details  (indicate cue type and reason) Demonstration and verbal cues for correct technique with one rail & cane.   Stair Management Technique One rail Left;With cane;Step to pattern;Forwards  Number of Stairs 4  Height of Stairs 6  Ramp 4: Min assist (RW & prosthesis)  Ramp Details (indicate cue type and reason) Verbal cues for safety.  Curb 4: Min assist (RW & prosthesis)  Curb Details (indicate cue type and reason) Verbal cues to remain close within walker and close to curb.  Gait Comments Pt presented with unsteadiness while negotiatiing steps and would benefit from further training.   Prosthetics  Prosthetic Care Comments  PT instructed in adjusting ply socks with too few, too many & proper amount based on standing/gait limb pressure &  guidance with how donnes, clicks, patella location and ht.  Recommended to pt that he increase wear time to 5-6 hrs x 2 a day with drying and cleaning during two hour break. Discussed the improtance of  keeping limb dry and proper sock  amount.   Current prosthetic wear tolerance (days/week)  daily  Current prosthetic wear tolerance (#hours/day)  4-5hrs x2 a day  Edema pitting  Residual limb condition  3 dry scabs on incision, no hair growth, shiny skin, normal color & temperature, cylinderical shape  Education Provided Skin check;Residual limb care;Correct ply sock adjustment;Proper Donning;Proper wear schedule/adjustment;Other (comment) (see prosthetic care comments)  Person(s) Educated Patient;Spouse;Child(ren)  Education Method Explanation;Demonstration;Tactile cues;Verbal cues  Education Method Verbalized understanding;Needs further instruction;Tactile cues required;Verbal cues required  Donning Prosthesis 4                        PT Short Term Goals - 06/16/18 1948      PT SHORT TERM GOAL #1   Title  Pt will verbalize/demonstrate understanding of prosthetic care and appropriate wear time/ply sock adjustment to increase independence  with functional mobility.  (Target Date: 07/07/18-please make sure and set new STGs once these are checked)    Time  4    Period  Weeks    Status  On-going    Target Date  07/07/18      PT SHORT TERM GOAL #2   Title  Pt will improve BERG balance test to 20/56 in order to indicate decreased fall risk.      Time  4    Period  Weeks    Status  On-going    Target Date  07/07/18      PT SHORT TERM GOAL #3   Title  Pt will tolerate wearing prosthesis >/=60% of awake hours in order to increase pts functional mobility.      Time  4    Period  Weeks    Status  On-going    Target Date  07/07/18      PT SHORT TERM GOAL #4   Title  Pt will ambulate x 150' with RW and prosthesis at S level over indoor surfaces in order to indicate safe household ambulation.     Time  4    Period  Weeks    Status  On-going    Target Date  07/07/18      PT SHORT TERM GOAL #5   Title  Pt will improve gait speed w/ LRAD to >/=1.65 ft/sec in order to indicate decreased fall risk.     Time  4    Period  Weeks    Status  On-going    Target Date  07/07/18        PT Long Term Goals - 06/16/18 1949      PT LONG TERM GOAL #1   Title  Pt will be independent with HEP along with prosthetic care in order to indicate improved functional mobility and safety with prosthesis.  (Target Date: 09/05/17)    Time  12    Period  Weeks    Status  On-going    Target Date  09/05/17      PT LONG TERM GOAL #2   Title  Pt will tolerate wearing prosthesis >/=90% of awake hours without negative skin complications.     Time  12    Period  Weeks    Status  On-going    Target Date  09/05/17      PT LONG TERM GOAL #3   Title  Pt will ambulate w/ LRAD >500' over indoor and unlevel paved outdoor surfaces at mod I level in order to indicate safe home and limited community negotiation.     Time  12    Period  Weeks    Status  On-going    Target Date  09/05/17      PT LONG TERM GOAL #4   Title  Pt will improve BERG balance  score to >/=28/56 in order to indicate decreased fall risk.     Time  12    Period  Weeks    Status  On-going    Target Date  09/05/17      PT LONG TERM GOAL #5   Title  Pt will improve gait speed to >/=2.25 ft/sec w/ LRAD in order to indicate decreased fall risk.      Time  12    Period  Weeks    Status  On-going    Target Date  09/05/17            Plan - 06/21/18 0946    Clinical Impression Statement  Treatment today focused on education about the correct sock amount and gradual increase in wear time. Patient was able to ambulate throughout clinic without complaint. Stair training with one rail and a cane was performed and pt would benefit from further training. Pt would benefit from further physical therapy to increase selfc-care and promote functional independence.    Rehab Potential  Good    Clinical Impairments Affecting Rehab Potential  Pt motivated to progress and become as independent as possible, limited by number of co-morbidities    PT Frequency  2x / week    PT Duration  12 weeks    PT Treatment/Interventions  ADLs/Self Care Home Management;DME Instruction;Gait training;Stair training;Functional mobility training;Therapeutic activities;Therapeutic exercise;Balance training;Neuromuscular re-education;Prosthetic Training;Passive range of motion;Energy conservation;Vestibular    PT Next Visit Plan  review sock education/limb care, continue stair training with cane and gait training with walker    Consulted and Agree with Plan of Care  Patient;Family member/caregiver    Family Member Consulted  wife, son       Patient will benefit from skilled therapeutic intervention in order to improve the following deficits and impairments:  Abnormal gait, Decreased activity tolerance, Decreased balance, Decreased endurance, Decreased knowledge of precautions, Decreased knowledge of use of DME, Decreased mobility, Decreased strength, Difficulty walking, Impaired perceived functional  ability, Impaired flexibility, Impaired sensation, Prosthetic Dependency, Postural dysfunction  Visit Diagnosis: Muscle weakness (generalized)  Other abnormalities of gait and mobility  Unsteadiness on feet  Abnormal posture     Problem List Patient Active Problem List   Diagnosis Date Noted  . Hx of BKA, left ([place]) 01/13/2018  . Nausea and vomiting in adult 12/26/2017  . Pressure injury of skin 12/23/2017  . C. difficile colitis 12/22/2017  . UTI (urinary tract infection) 12/22/2017  . Hyponatremia 12/22/2017  . Normocytic anemia 12/22/2017  . Chronic anticoagulation 09/26/2015  . Persistent atrial fibrillation 09/26/2015  . PVD (peripheral vascular disease) ([place]) 06/02/2012  . BPH (benign prostatic hyperplasia) 11/16/2011  . AKI (acute kidney injury) ([place]) 08/26/2011  . Coronary artery disease 03/25/2011  . OBSTRUCTIVE SLEEP APNEA 09/15/2010  . PERIODIC LIMB MOVEMENT DISORDER 09/15/2010  . PERSONAL HISTORY OF COLONIC POLYPS 10/25/2009  . VITAMIN B12 DEFICIENCY 03/04/2009  . HYPERTRIGLYCERIDEMIA 11/24/2006  . Essential hypertension 11/24/2006   Donnald Garre, SPTA 06/21/2018, 9:50 AM  Jamey Reas PT, DPT 06/21/2018, 12:48 PM  [facility] [address] [place], [place], [address] Phone: [phone]   Fax:  [phone]  Name: Eric [name]. MRN: [id number] Date of Birth: 08-17-1927

## 2018-06-23 ENCOUNTER — Ambulatory Visit: Payer: PPO | Admitting: Physical Therapy

## 2018-06-23 DIAGNOSIS — R2689 Other abnormalities of gait and mobility: Secondary | ICD-10-CM

## 2018-06-23 DIAGNOSIS — R2681 Unsteadiness on feet: Secondary | ICD-10-CM | POA: Diagnosis not present

## 2018-06-23 DIAGNOSIS — M6281 Muscle weakness (generalized): Secondary | ICD-10-CM

## 2018-06-23 NOTE — Therapy (Signed)
[facility] [address] [place], [place], [address] Phone: [phone]   Fax:  [phone]  Physical Therapy Treatment  Patient Details  Name: Eric [name]. MRN: [id number] Date of Birth: 1927/11/21 Referring Provider (PT): Leotis Pain, MD   Encounter Date: 06/23/2018  PT End of Session - 06/23/18 1028    Visit Number  6    Number of Visits  25    Date for PT Re-Evaluation  09/05/18    Authorization Type  [employer]-Needs 10th visit PN    PT Start Time  0930    PT Stop Time  1015    PT Time Calculation (min)  45 min    Equipment Utilized During Treatment  Gait belt    Activity Tolerance  Patient tolerated treatment well    Behavior During Therapy  WFL for tasks assessed/performed       Past Medical History:  Diagnosis Date  . Atrial flutter ([place])    s/p RFCA  . CAD (coronary artery disease)    cath 2003, occluded S-RCA, occluded S-Dx, L-LAD ok, s/p PTCA to LAD  . Cataract   . CKD (chronic kidney disease) stage 3, GFR 30-59 ml/min (HCC)   . Diabetes mellitus    diet controlled  . Hearing aid worn    bilateral  . History of kidney stones   . Long term (current) use of anticoagulants   . Neuromuscular disorder ([place])   . OSA (obstructive sleep apnea) 12/11   very mild, AHI 7/hr  . Persistent atrial fibrillation 09/26/2015  . Pure hyperglyceridemia   . PVD (peripheral vascular disease) ([place])    angioplasty of his right lower extremity in [place] by Dr.[name] 2013  . Unspecified essential hypertension   . Wears dentures    full upper and lower    Past Surgical History:  Procedure Laterality Date  . ABDOMINAL AORTOGRAM W/LOWER EXTREMITY N/A 11/15/2017   Procedure: ABDOMINAL AORTOGRAM W/LOWER EXTREMITY;  Surgeon: [name], MD;  Location: [facility];  Service: Cardiovascular;  Laterality: N/A;  . Adenosine Myoview  3/06   EF 56%, neg. Ischemia  . Adenosine Myoview  02/18/07   nml  . AMPUTATION Left 12/15/2017   Procedure: AMPUTATION BELOW KNEE;  Surgeon: Algernon [name], MD;  Location: [facility];  Service: Vascular;  Laterality: Left;  . ANGIOPLASTY  1/99   CAD- diogonal with rotational artherectomy  . Arthrectomy     of LAD & PTCA  . BLEPHAROPLASTY Bilateral   . CARDIAC CATHETERIZATION  1/00  . CARDIOVERSION  1/04  . CARDIOVERSION  5/07   hospital- a flutter  . CARPAL TUNNEL RELEASE     ? bilateral  . CATARACT EXTRACTION W/PHACO Right 07/28/2017   Procedure: CATARACT EXTRACTION PHACO AND INTRAOCULAR LENS PLACEMENT ([place]) RIGHT DIABETIC;  Surgeon: [name], MD;  Location: [facility];  Service: Ophthalmology;  Laterality: Right;  Diabetic - diet controlled  . CATARACT EXTRACTION W/PHACO Left 08/18/2017   Procedure: CATARACT EXTRACTION PHACO AND INTRAOCULAR LENS PLACEMENT ([facility]);  Surgeon: [name], MD;  Location: [facility];  Service: Ophthalmology;  Laterality: Left;  DIABETES - oral meds  . COLONOSCOPY W/ BIOPSIES  10/01/06   sigmoid polyp bx neg, 3 years  . CORONARY ANGIOPLASTY  4/03   cutting balloon PTCA pLAD into Diag  . CORONARY ARTERY BYPASS GRAFT  2000   LIMA-LAD, SVG-RCA, SVG-Diag; SVG-Diag & SVG-RCA occluded 2003  . FRACTURE SURGERY    . HAND  SURGERY  08/27/09   R thumb procedure wit Scaphoid Gragt and screws, Dr [name]  . NM MYOVIEW LTD  4/11   normal  . PERIPHERAL VASCULAR CATHETERIZATION Right 06/27/2015   Procedure: Lower Extremity Angiography;  Surgeon: Algernon [name], MD;  Location: [facility];  Service: Cardiovascular;  Laterality: Right;  . PERIPHERAL VASCULAR CATHETERIZATION  06/27/2015   Procedure: Lower Extremity Intervention;  Surgeon: Algernon [name], MD;  Location: [facility];  Service: Cardiovascular;;    There were no vitals filed for this visit.  Subjective Assessment - 06/23/18 0936    Subjective  He was having a hard time getting the right amount of socks this morning. He has not  had any falls since last visit.    Patient is accompained by:  Family member    Pertinent History  L TTA, CAD, A-Fib, CKD3, DM2, PAD, HTN, HLD    Patient Stated Goals  He wants to use prosthesis so can walk & no longer needs his kids to [facility] Adult PT Treatment/Exercise - 06/23/18 1002      Transfers   Transfers  Sit to Stand;Stand to Sit   transfer with WC; transfer to/from mat table to RW,    Sit to Stand  5: Supervision;With armrests;With upper extremity assist;From chair/3-in-1   to RW, chairs with & without armrests, and from mat table.   Sit to Stand Details  Verbal cues for technique;Verbal cues for sequencing;Verbal cues for precautions/safety;Other (comment)   VCs to scoot forward with feet underneath.   Stand to Sit  5: Supervision;With upper extremity assist;With armrests;To chair/3-in-1   from RW, chairs with & without armrests   Stand to Sit Details (indicate cue type and reason)  Verbal cues for sequencing;Verbal cues for technique;Verbal cues for precautions/safety    Comments  Performed 3 reps from [facility] and 5 reps from mat table with/without use of UE.      Ambulation/Gait   Ambulation/Gait  Yes    Ambulation/Gait Assistance  4: Min guard;5: Supervision    Ambulation/Gait Assistance Details  Verbal cues for step width and use of theraband marker on RW for visual cues.    Ambulation Distance (Feet)  230 Feet   230x2, 115x1   Assistive device  Rolling walker;Prosthesis    Gait Pattern  Step-through pattern;Decreased stance time - right;Decreased weight shift to left;Lateral hip instability;Trunk flexed;Narrow base of support;Left flexed knee in stance    Stairs  Yes    Stairs Assistance  5: Supervision;4: Min guard    Stair Management Technique  One rail Left;With cane;Step to pattern;Forwards;One rail Right    Number of Stairs  4   x3 reps   Height of Stairs  6    Gait Comments  Pt demonstrated improved  technique while negotiating steps. He required verbal cues for step width to increase BOS. Pt had difficulty with foot placement while on stairs requiring multiple attempts to clear step. 2 reps with right rail/cane, one rep with left rail/cane.       Balance   Balance Assessed  Yes      High Level Balance   High Level Balance Activities  Side stepping   4 laps at with counter support   High Level Balance Comments  Verbal cues for keeping toes pointed toward counter to emphasize abductors.  Prosthetics   Prosthetic Care Comments   Reviewed proper sock wear with demonstration. Reviewed recommended wear time of 5-6 hours in the morning with a break for drying/cleaning; then another 5-6 wear time.    Current prosthetic wear tolerance (days/week)   daily    Current prosthetic wear tolerance (#hours/day)   4-5hrs x2 a day    Edema  pitting    Residual limb condition   3 dry scabs on incision, no hair growth, shiny skin, normal color & temperature, cylinderical shape    Education Provided  Skin check;Residual limb care;Correct ply sock adjustment;Proper Donning;Proper wear schedule/adjustment;Other (comment)   see prosthetic care comments            PT Education - 06/23/18 1028    Education Details  See prosthetic care comments.    Person(s) Educated  Patient;Spouse;Child(ren)    Methods  Explanation;Demonstration;Tactile cues;Verbal cues    Comprehension  Verbalized understanding;Need further instruction       PT Short Term Goals - 06/16/18 1948      PT SHORT TERM GOAL #1   Title  Pt will verbalize/demonstrate understanding of prosthetic care and appropriate wear time/ply sock adjustment to increase independence with functional mobility.  (Target Date: 07/07/18-please make sure and set new STGs once these are checked)    Time  4    Period  Weeks    Status  On-going    Target Date  07/07/18      PT SHORT TERM GOAL #2   Title  Pt will improve BERG balance test to 20/56 in  order to indicate decreased fall risk.      Time  4    Period  Weeks    Status  On-going    Target Date  07/07/18      PT SHORT TERM GOAL #3   Title  Pt will tolerate wearing prosthesis >/=60% of awake hours in order to increase pts functional mobility.      Time  4    Period  Weeks    Status  On-going    Target Date  07/07/18      PT SHORT TERM GOAL #4   Title  Pt will ambulate x 150' with RW and prosthesis at S level over indoor surfaces in order to indicate safe household ambulation.     Time  4    Period  Weeks    Status  On-going    Target Date  07/07/18      PT SHORT TERM GOAL #5   Title  Pt will improve gait speed w/ LRAD to >/=1.65 ft/sec in order to indicate decreased fall risk.     Time  4    Period  Weeks    Status  On-going    Target Date  07/07/18        PT Long Term Goals - 06/16/18 1949      PT LONG TERM GOAL #1   Title  Pt will be independent with HEP along with prosthetic care in order to indicate improved functional mobility and safety with prosthesis.  (Target Date: 09/05/17)    Time  12    Period  Weeks    Status  On-going    Target Date  09/05/17      PT LONG TERM GOAL #2   Title  Pt will tolerate wearing prosthesis >/=90% of awake hours without negative skin complications.     Time  12    Period  Weeks  Status  On-going    Target Date  09/05/17      PT LONG TERM GOAL #3   Title  Pt will ambulate w/ LRAD >500' over indoor and unlevel paved outdoor surfaces at mod I level in order to indicate safe home and limited community negotiation.     Time  12    Period  Weeks    Status  On-going    Target Date  09/05/17      PT LONG TERM GOAL #4   Title  Pt will improve BERG balance score to >/=28/56 in order to indicate decreased fall risk.     Time  12    Period  Weeks    Status  On-going    Target Date  09/05/17      PT LONG TERM GOAL #5   Title  Pt will improve gait speed to >/=2.25 ft/sec w/ LRAD in order to indicate decreased fall risk.       Time  12    Period  Weeks    Status  On-going    Target Date  09/05/17            Plan - 06/23/18 1029    Clinical Impression Statement  Treatment today review prosthetic care and wear tolerance. Pt had not increased wear time since recommendation from last visit. Reviewed recommended wear time with pt. Pt demonstrated improved gait pattern as treatment progressed after correct sock adjustment was made.  Pt would beneft from further therapy to increase strength, activity tolerance, and improve gait deviations.    Clinical Presentation  Evolving    Clinical Decision Making  Moderate    Rehab Potential  Good    Clinical Impairments Affecting Rehab Potential  Pt motivated to progress and become as independent as possible, limited by number of co-morbidities    PT Frequency  2x / week    PT Duration  12 weeks    PT Treatment/Interventions  ADLs/Self Care Home Management;DME Instruction;Gait training;Stair training;Functional mobility training;Therapeutic activities;Therapeutic exercise;Balance training;Neuromuscular re-education;Prosthetic Training;Passive range of motion;Energy conservation;Vestibular    PT Next Visit Plan  Continue gait training, prosthetci care eductation, stairs/ramps and balance activities.    Consulted and Agree with Plan of Care  Patient;Family member/caregiver    Family Member Consulted  wife, son       Patient will benefit from skilled therapeutic intervention in order to improve the following deficits and impairments:  Abnormal gait, Decreased activity tolerance, Decreased balance, Decreased endurance, Decreased knowledge of precautions, Decreased knowledge of use of DME, Decreased mobility, Decreased strength, Difficulty walking, Impaired perceived functional ability, Impaired flexibility, Impaired sensation, Prosthetic Dependency, Postural dysfunction  Visit Diagnosis: Other abnormalities of gait and mobility  Muscle weakness (generalized)     Problem  List Patient Active Problem List   Diagnosis Date Noted  . Hx of BKA, left ([place]) 01/13/2018  . Nausea and vomiting in adult 12/26/2017  . Pressure injury of skin 12/23/2017  . C. difficile colitis 12/22/2017  . UTI (urinary tract infection) 12/22/2017  . Hyponatremia 12/22/2017  . Normocytic anemia 12/22/2017  . Chronic anticoagulation 09/26/2015  . Persistent atrial fibrillation 09/26/2015  . PVD (peripheral vascular disease) ([place]) 06/02/2012  . BPH (benign prostatic hyperplasia) 11/16/2011  . AKI (acute kidney injury) ([place]) 08/26/2011  . Coronary artery disease 03/25/2011  . OBSTRUCTIVE SLEEP APNEA 09/15/2010  . PERIODIC LIMB MOVEMENT DISORDER 09/15/2010  . PERSONAL HISTORY OF COLONIC POLYPS 10/25/2009  . VITAMIN B12 DEFICIENCY 03/04/2009  . HYPERTRIGLYCERIDEMIA 11/24/2006  .  Essential hypertension 11/24/2006    Tomicka Lover SPTA 06/23/2018, 10:32 AM  [facility] [address] [place], [place], [address] Phone: [phone]   Fax:  [phone]  Name: Eric [name]. MRN: [id number] Date of Birth: [birth year]

## 2018-06-26 DIAGNOSIS — S88112D Complete traumatic amputation at level between knee and ankle, left lower leg, subsequent encounter: Secondary | ICD-10-CM | POA: Diagnosis not present

## 2018-06-28 ENCOUNTER — Ambulatory Visit: Payer: PPO | Admitting: Physical Therapy

## 2018-06-28 DIAGNOSIS — R2689 Other abnormalities of gait and mobility: Secondary | ICD-10-CM

## 2018-06-28 DIAGNOSIS — M6281 Muscle weakness (generalized): Secondary | ICD-10-CM

## 2018-06-28 DIAGNOSIS — R293 Abnormal posture: Secondary | ICD-10-CM

## 2018-06-28 DIAGNOSIS — R2681 Unsteadiness on feet: Secondary | ICD-10-CM | POA: Diagnosis not present

## 2018-06-28 DIAGNOSIS — S88112D Complete traumatic amputation at level between knee and ankle, left lower leg, subsequent encounter: Secondary | ICD-10-CM | POA: Diagnosis not present

## 2018-06-28 DIAGNOSIS — E11621 Type 2 diabetes mellitus with foot ulcer: Secondary | ICD-10-CM | POA: Diagnosis not present

## 2018-06-28 NOTE — Therapy (Signed)
[facility] [address] [place], [place], [address] Phone: [phone]   Fax:  [phone]  Physical Therapy Treatment  Patient Details  Name: Eric [name]. MRN: [id number] Date of Birth: September 27, 1927 Referring Provider (PT): Leotis Pain, MD   Encounter Date: 06/28/2018  PT End of Session - 06/28/18 1111    Visit Number  7    Number of Visits  25    Date for PT Re-Evaluation  09/05/18    Authorization Type  [employer]-Needs 10th visit PN    PT Start Time  0930    PT Stop Time  1014    PT Time Calculation (min)  44 min    Equipment Utilized During Treatment  Gait belt    Activity Tolerance  Patient tolerated treatment well    Behavior During Therapy  WFL for tasks assessed/performed       Past Medical History:  Diagnosis Date  . Atrial flutter ([place])    s/p RFCA  . CAD (coronary artery disease)    cath 2003, occluded S-RCA, occluded S-Dx, L-LAD ok, s/p PTCA to LAD  . Cataract   . CKD (chronic kidney disease) stage 3, GFR 30-59 ml/min ([facility])   . Diabetes mellitus    diet controlled  . Hearing aid worn    bilateral  . History of kidney stones   . Long term (current) use of anticoagulants   . Neuromuscular disorder ([place])   . OSA (obstructive sleep apnea) 12/11   very mild, AHI 7/hr  . Persistent atrial fibrillation 09/26/2015  . Pure hyperglyceridemia   . PVD (peripheral vascular disease) ([place])    angioplasty of his right lower extremity in [place] by Dr.[name] 2013  . Unspecified essential hypertension   . Wears dentures    full upper and lower    Past Surgical History:  Procedure Laterality Date  . ABDOMINAL AORTOGRAM W/LOWER EXTREMITY N/A 11/15/2017   Procedure: ABDOMINAL AORTOGRAM W/LOWER EXTREMITY;  Surgeon: [name], MD;  Location: [facility];  Service: Cardiovascular;  Laterality: N/A;  . Adenosine Myoview  3/06   EF 56%, neg. Ischemia  . Adenosine Myoview  02/18/07    nml  . AMPUTATION Left 12/15/2017   Procedure: AMPUTATION BELOW KNEE;  Surgeon: Algernon [name], MD;  Location: [facility];  Service: Vascular;  Laterality: Left;  . ANGIOPLASTY  1/99   CAD- diogonal with rotational artherectomy  . Arthrectomy     of LAD & PTCA  . BLEPHAROPLASTY Bilateral   . CARDIAC CATHETERIZATION  1/00  . CARDIOVERSION  1/04  . CARDIOVERSION  5/07   hospital- a flutter  . CARPAL TUNNEL RELEASE     ? bilateral  . CATARACT EXTRACTION W/PHACO Right 07/28/2017   Procedure: CATARACT EXTRACTION PHACO AND INTRAOCULAR LENS PLACEMENT ([place]) RIGHT DIABETIC;  Surgeon: [name], MD;  Location: [facility];  Service: Ophthalmology;  Laterality: Right;  Diabetic - diet controlled  . CATARACT EXTRACTION W/PHACO Left 08/18/2017   Procedure: CATARACT EXTRACTION PHACO AND INTRAOCULAR LENS PLACEMENT ([facility]);  Surgeon: [name], MD;  Location: [facility];  Service: Ophthalmology;  Laterality: Left;  DIABETES - oral meds  . COLONOSCOPY W/ BIOPSIES  10/01/06   sigmoid polyp bx neg, 3 years  . CORONARY ANGIOPLASTY  4/03   cutting balloon PTCA pLAD into Diag  . CORONARY ARTERY BYPASS GRAFT  2000   LIMA-LAD, SVG-RCA, SVG-Diag; SVG-Diag & SVG-RCA occluded 2003  . FRACTURE SURGERY    .  HAND SURGERY  08/27/09   R thumb procedure wit Scaphoid Gragt and screws, Dr [name]  . NM MYOVIEW LTD  4/11   normal  . PERIPHERAL VASCULAR CATHETERIZATION Right 06/27/2015   Procedure: Lower Extremity Angiography;  Surgeon: Algernon [name], MD;  Location: [facility];  Service: Cardiovascular;  Laterality: Right;  . PERIPHERAL VASCULAR CATHETERIZATION  06/27/2015   Procedure: Lower Extremity Intervention;  Surgeon: Algernon [name], MD;  Location: [facility];  Service: Cardiovascular;;    There were no vitals filed for this visit.  Subjective Assessment - 06/28/18 0934    Subjective  He has not had falls.    Patient is accompained by:  Family member    Pertinent  History  L TTA, CAD, A-Fib, CKD3, DM2, PAD, HTN, HLD    Patient Stated Goals  He wants to use prosthesis so can walk & no longer needs his kids to transport & assist.     Currently in Pain?  No/denies               [facility] Adult PT Treatment/Exercise - 06/28/18 0935      Transfers   Transfers  Sit to Stand;Stand to Sit   transfer with WC; transfer to/from mat table to RW,    Sit to Stand  5: Supervision;With armrests;With upper extremity assist;From chair/3-in-1   to RW, chairs with  armrests   Sit to Stand Details  Verbal cues for technique;Verbal cues for sequencing;Verbal cues for precautions/safety;Other (comment)   VCs to scoot forward with feet underneath.   Stand to Sit  5: Supervision;With upper extremity assist;With armrests;To chair/3-in-1   from RW, chairs with armrests   Stand to Sit Details (indicate cue type and reason)  Verbal cues for sequencing;Verbal cues for technique;Verbal cues for precautions/safety    Comments  Performed 5 reps from chair with armrests. Pt was supervion due to increased effort and static balance. Pt demonstrated controlled descent without use of UE. Pt attempted transfer without use of UE but was able to despite VCs for technique.      Ambulation/Gait   Ambulation/Gait  Yes    Ambulation/Gait Assistance  4: Min guard;5: Supervision    Ambulation/Gait Assistance Details  Verbal cues for step width with theraband as visual marker.     Ambulation Distance (Feet)  230 Feet   230x2, 115x2   Assistive device  Rolling walker;Prosthesis    Gait Pattern  Step-through pattern;Decreased stance time - right;Decreased weight shift to left;Lateral hip instability;Trunk flexed;Narrow base of support;Left flexed knee in stance    Stairs  Yes    Stairs Assistance  5: Supervision;4: Min guard    Stair Management Technique  One rail Left;With cane;Step to pattern;Forwards;One rail Right    Number of Stairs  4   x3 reps   Height of Stairs  6    Ramp  5:  Supervision    Ramp Details (indicate cue type and reason)  VCs for posture and increase BOS    Curb  5: Supervision    Curb Details (indicate cue type and reason)  VCs given for technique and safety.    Gait Comments  Pt intially demonstrated improved gait with good step length but required increase in VCs after fatigue. Pt presents with forward flexed posture requiring VCs.  VCs for sequencing on curb/ramp/stairs were given for safety.     Balance   Balance Assessed  Yes      High Level Balance  High Level Balance Activities  Side stepping   4 laps at with counter support, require   High Level Balance Comments  Verbal cues for keeping toes pointed toward counter to emphasize abductors and VCs to decrease speed for saftety.      Prosthetics   Prosthetic Care Comments   Reviewed sock mgt, wear time, the importance, and the use of baby oil to reduce the rubbing from the liner.`    Current prosthetic wear tolerance (days/week)   daily    Current prosthetic wear tolerance (#hours/day)   4-5hrs x2 a day   8-10 hrs, takes it and cleans at some point during the day. Not consisitant with time.   Edema  pitting    Residual limb condition   3 dry scabs on incision, red irritation on medial/superior thigh, no hair growth, shiny skin, normal color & temperature, cylinderical shape    Education Provided  Skin check;Residual limb care;Correct ply sock adjustment;Proper Donning;Proper wear schedule/adjustment;Other (comment)   see prosthetic care comments            PT Short Term Goals - 06/16/18 1948      PT SHORT TERM GOAL #1   Title  Pt will verbalize/demonstrate understanding of prosthetic care and appropriate wear time/ply sock adjustment to increase independence with functional mobility.  (Target Date: 07/07/18-please make sure and set new STGs once these are checked)    Time  4    Period  Weeks    Status  On-going    Target Date  07/07/18      PT SHORT TERM GOAL #2   Title  Pt  will improve BERG balance test to 20/56 in order to indicate decreased fall risk.      Time  4    Period  Weeks    Status  On-going    Target Date  07/07/18      PT SHORT TERM GOAL #3   Title  Pt will tolerate wearing prosthesis >/=60% of awake hours in order to increase pts functional mobility.      Time  4    Period  Weeks    Status  On-going    Target Date  07/07/18      PT SHORT TERM GOAL #4   Title  Pt will ambulate x 150' with RW and prosthesis at S level over indoor surfaces in order to indicate safe household ambulation.     Time  4    Period  Weeks    Status  On-going    Target Date  07/07/18      PT SHORT TERM GOAL #5   Title  Pt will improve gait speed w/ LRAD to >/=1.65 ft/sec in order to indicate decreased fall risk.     Time  4    Period  Weeks    Status  On-going    Target Date  07/07/18        PT Long Term Goals - 06/16/18 1949      PT LONG TERM GOAL #1   Title  Pt will be independent with HEP along with prosthetic care in order to indicate improved functional mobility and safety with prosthesis.  (Target Date: 09/05/17)    Time  12    Period  Weeks    Status  On-going    Target Date  09/05/17      PT LONG TERM GOAL #2   Title  Pt will tolerate wearing prosthesis >/=90% of awake hours without negative  skin complications.     Time  12    Period  Weeks    Status  On-going    Target Date  09/05/17      PT LONG TERM GOAL #3   Title  Pt will ambulate w/ LRAD >500' over indoor and unlevel paved outdoor surfaces at mod I level in order to indicate safe home and limited community negotiation.     Time  12    Period  Weeks    Status  On-going    Target Date  09/05/17      PT LONG TERM GOAL #4   Title  Pt will improve BERG balance score to >/=28/56 in order to indicate decreased fall risk.     Time  12    Period  Weeks    Status  On-going    Target Date  09/05/17      PT LONG TERM GOAL #5   Title  Pt will improve gait speed to >/=2.25 ft/sec w/ LRAD  in order to indicate decreased fall risk.      Time  12    Period  Weeks    Status  On-going    Target Date  09/05/17            Plan - 06/28/18 1111    Clinical Impression Statement  Treatment today focused on limb care due to scratches and skin irritation on superior/medial thigh from liner. Pt demonstrated improved gait but could benefit from core strengthening to assist with functional independence. Pt would benefit from skilled therapy to increase strength, balance, and self care.    Clinical Presentation  Evolving    Clinical Decision Making  Moderate    Rehab Potential  Good    Clinical Impairments Affecting Rehab Potential  Pt motivated to progress and become as independent as possible, limited by number of co-morbidities    PT Frequency  2x / week    PT Duration  12 weeks    PT Treatment/Interventions  ADLs/Self Care Home Management;DME Instruction;Gait training;Stair training;Functional mobility training;Therapeutic activities;Therapeutic exercise;Balance training;Neuromuscular re-education;Prosthetic Training;Passive range of motion;Energy conservation;Vestibular    PT Next Visit Plan  Continue gait training, prosthetic care eductation, stairs/ramps and balance activities. Work on [employer].    Consulted and Agree with Plan of Care  Patient;Family member/caregiver    Family Member Consulted  son       Patient will benefit from skilled therapeutic intervention in order to improve the following deficits and impairments:  Abnormal gait, Decreased activity tolerance, Decreased balance, Decreased endurance, Decreased knowledge of precautions, Decreased knowledge of use of DME, Decreased mobility, Decreased strength, Difficulty walking, Impaired perceived functional ability, Impaired flexibility, Impaired sensation, Prosthetic Dependency, Postural dysfunction  Visit Diagnosis: Other abnormalities of gait and mobility  Unsteadiness on feet  Muscle weakness  (generalized)  Abnormal posture     Problem List Patient Active Problem List   Diagnosis Date Noted  . Hx of BKA, left ([place]) 01/13/2018  . Nausea and vomiting in adult 12/26/2017  . Pressure injury of skin 12/23/2017  . C. difficile colitis 12/22/2017  . UTI (urinary tract infection) 12/22/2017  . Hyponatremia 12/22/2017  . Normocytic anemia 12/22/2017  . Chronic anticoagulation 09/26/2015  . Persistent atrial fibrillation 09/26/2015  . PVD (peripheral vascular disease) ([place]) 06/02/2012  . BPH (benign prostatic hyperplasia) 11/16/2011  . AKI (acute kidney injury) ([place]) 08/26/2011  . Coronary artery disease 03/25/2011  . OBSTRUCTIVE SLEEP APNEA 09/15/2010  . PERIODIC LIMB MOVEMENT DISORDER 09/15/2010  .  PERSONAL HISTORY OF COLONIC POLYPS 10/25/2009  . VITAMIN B12 DEFICIENCY 03/04/2009  . HYPERTRIGLYCERIDEMIA 11/24/2006  . Essential hypertension 11/24/2006    Alesia Oshields SPTA 06/28/2018, 11:14 AM  [facility] [address] [place], [place], [address] Phone: [phone]   Fax:  [phone]  Name: Jonty Morrical. MRN: [id number] Date of Birth: [birth year]

## 2018-07-01 ENCOUNTER — Encounter: Payer: Self-pay | Admitting: Rehabilitation

## 2018-07-01 ENCOUNTER — Encounter: Payer: Self-pay | Admitting: Family Medicine

## 2018-07-01 ENCOUNTER — Ambulatory Visit: Payer: PPO | Admitting: Rehabilitation

## 2018-07-01 DIAGNOSIS — M6281 Muscle weakness (generalized): Secondary | ICD-10-CM

## 2018-07-01 DIAGNOSIS — R293 Abnormal posture: Secondary | ICD-10-CM

## 2018-07-01 DIAGNOSIS — R2681 Unsteadiness on feet: Secondary | ICD-10-CM | POA: Diagnosis not present

## 2018-07-01 DIAGNOSIS — R2689 Other abnormalities of gait and mobility: Secondary | ICD-10-CM

## 2018-07-01 NOTE — Therapy (Signed)
[facility] [address] [place], [place], [address] Phone: [phone]   Fax:  [phone]  Physical Therapy Treatment  Patient Details  Name: Eric [name]. MRN: [id number] Date of Birth: 08/06/1928 Referring Provider (PT): Leotis Pain, MD   Encounter Date: 07/01/2018  PT End of Session - 07/01/18 1135    Visit Number  8    Number of Visits  25    Date for PT Re-Evaluation  09/05/18    Authorization Type  [employer]-Needs 10th visit PN    PT Start Time  0938    PT Stop Time  1017    PT Time Calculation (min)  39 min    Equipment Utilized During Treatment  Gait belt    Activity Tolerance  Patient tolerated treatment well    Behavior During Therapy  WFL for tasks assessed/performed       Past Medical History:  Diagnosis Date  . Atrial flutter ([place])    s/p RFCA  . CAD (coronary artery disease)    cath 2003, occluded S-RCA, occluded S-Dx, L-LAD ok, s/p PTCA to LAD  . Cataract   . CKD (chronic kidney disease) stage 3, GFR 30-59 ml/min (HCC)   . Diabetes mellitus    diet controlled  . Hearing aid worn    bilateral  . History of kidney stones   . Long term (current) use of anticoagulants   . Neuromuscular disorder ([place])   . OSA (obstructive sleep apnea) 12/11   very mild, AHI 7/hr  . Persistent atrial fibrillation 09/26/2015  . Pure hyperglyceridemia   . PVD (peripheral vascular disease) ([place])    angioplasty of his right lower extremity in [place] by Dr.[name] 2013  . Unspecified essential hypertension   . Wears dentures    full upper and lower    Past Surgical History:  Procedure Laterality Date  . ABDOMINAL AORTOGRAM W/LOWER EXTREMITY N/A 11/15/2017   Procedure: ABDOMINAL AORTOGRAM W/LOWER EXTREMITY;  Surgeon: [name], MD;  Location: [facility];  Service: Cardiovascular;  Laterality: N/A;  . Adenosine Myoview  3/06   EF 56%, neg. Ischemia  . Adenosine Myoview  02/18/07   nml  . AMPUTATION Left 12/15/2017   Procedure: AMPUTATION BELOW KNEE;  Surgeon: Algernon [name], MD;  Location: [facility];  Service: Vascular;  Laterality: Left;  . ANGIOPLASTY  1/99   CAD- diogonal with rotational artherectomy  . Arthrectomy     of LAD & PTCA  . BLEPHAROPLASTY Bilateral   . CARDIAC CATHETERIZATION  1/00  . CARDIOVERSION  1/04  . CARDIOVERSION  5/07   hospital- a flutter  . CARPAL TUNNEL RELEASE     ? bilateral  . CATARACT EXTRACTION W/PHACO Right 07/28/2017   Procedure: CATARACT EXTRACTION PHACO AND INTRAOCULAR LENS PLACEMENT ([place]) RIGHT DIABETIC;  Surgeon: [name], MD;  Location: [facility];  Service: Ophthalmology;  Laterality: Right;  Diabetic - diet controlled  . CATARACT EXTRACTION W/PHACO Left 08/18/2017   Procedure: CATARACT EXTRACTION PHACO AND INTRAOCULAR LENS PLACEMENT ([facility]);  Surgeon: [name], MD;  Location: [facility];  Service: Ophthalmology;  Laterality: Left;  DIABETES - oral meds  . COLONOSCOPY W/ BIOPSIES  10/01/06   sigmoid polyp bx neg, 3 years  . CORONARY ANGIOPLASTY  4/03   cutting balloon PTCA pLAD into Diag  . CORONARY ARTERY BYPASS GRAFT  2000   LIMA-LAD, SVG-RCA, SVG-Diag; SVG-Diag & SVG-RCA occluded 2003  . FRACTURE SURGERY    . HAND  SURGERY  08/27/09   R thumb procedure wit Scaphoid Gragt and screws, Dr [name]  . NM MYOVIEW LTD  4/11   normal  . PERIPHERAL VASCULAR CATHETERIZATION Right 06/27/2015   Procedure: Lower Extremity Angiography;  Surgeon: Algernon [name], MD;  Location: [facility];  Service: Cardiovascular;  Laterality: Right;  . PERIPHERAL VASCULAR CATHETERIZATION  06/27/2015   Procedure: Lower Extremity Intervention;  Surgeon: Algernon [name], MD;  Location: [facility];  Service: Cardiovascular;;    There were no vitals filed for this visit.  Subjective Assessment - 07/01/18 0943    Subjective  He has not had falls, no new complaints. Reports he has been walking with cane around  house without someone with him. Has been using a little bit of baby oil.     Patient is accompained by:  Family member    Pertinent History  L TTA, CAD, A-Fib, CKD3, DM2, PAD, HTN, HLD    Patient Stated Goals  He wants to use prosthesis so can walk & no longer needs his kids to transport & assist.     Currently in Pain?  No/denies                       [facility] Adult PT Treatment/Exercise - 07/01/18 0944      Transfers   Transfers  Sit to Stand;Stand to Sit    Sit to Stand  5: Supervision;4: Min guard;With upper extremity assist;From chair/3-in-1    Sit to Stand Details  Verbal cues for precautions/safety;Verbal cues for technique;Verbal cues for sequencing    Stand to Sit  5: Supervision;To chair/3-in-1;Without upper extremity assist    Comments  Pt stands without warning with noted LOB. pt then instructed on proper technique. pt pushes back of legs against mat table for added stability. Pt recommended to stand from stable surface or from chair against wall. Pt peforms several reps STS with verbal and tactile cues to ascend with equal weight bearing on LEs and verbal cues to scoot foward to avoid stabilizing legs on mat table.       Ambulation/Gait   Ambulation/Gait  Yes    Ambulation/Gait Assistance  5: Supervision;4: Min guard    Ambulation/Gait Assistance Details  verbal cues for proper sequencing with straight cane, posture, and to slow pace for safety.     Ambulation Distance (Feet)  345 Feet   242ft with RW; 196ft with straight cane    Assistive device  Rolling walker;Straight cane;Prosthesis    Gait Pattern  Step-through pattern;Decreased stance time - right;Decreased weight shift to left;Trunk flexed;Left flexed knee in stance    Stairs  Yes    Stairs Assistance  5: Supervision;4: Min guard    Stairs Assistance Details (indicate cue type and reason)  Pt performs stairs with cane and right handrail ascending with step to pattern with verbal cues for proper foot  placement. Pt then attempts descending with alternating pattern with cane requiring min assist with noted instability and verbal cues for proper sequencing with cane.     Stair Management Technique  One rail Left;With cane    Number of Stairs  4    Height of Stairs  6    Ramp  5: Supervision;4: Min assist    Ramp Details (indicate cue type and reason)  pt performs with step to pattern and verbal cues for proper sequencing with cane.    Curb  5: Supervision;4: Min assist   Hand held assist  to descend and ascend   Curb Details (indicate cue type and reason)  pt hesitant to do curb requiring hand held assist and cane. verbal cues to ensure prosthesis clearance when ascending.       Prosthetics   Prosthetic Care Comments   Reviewed use of baby oil to reduce friction and rubbing of liner.     Current prosthetic wear tolerance (days/week)   daily     Current prosthetic wear tolerance (#hours/day)   8-12hrs/day    Residual limb condition   3 dry scaps on incision; small redness on left side     Education Provided  Skin check    Donning Prosthesis  Supervision             PT Education - 07/01/18 1252    Education Details  See prosthetic care comments and gait details    Person(s) Educated  Patient;Child(ren)    Methods  Explanation    Comprehension  Verbalized understanding       PT Short Term Goals - 06/16/18 1948      PT SHORT TERM GOAL #1   Title  Pt will verbalize/demonstrate understanding of prosthetic care and appropriate wear time/ply sock adjustment to increase independence with functional mobility.  (Target Date: 07/07/18-please make sure and set new STGs once these are checked)    Time  4    Period  Weeks    Status  On-going    Target Date  07/07/18      PT SHORT TERM GOAL #2   Title  Pt will improve BERG balance test to 20/56 in order to indicate decreased fall risk.      Time  4    Period  Weeks    Status  On-going    Target Date  07/07/18      PT SHORT TERM GOAL  #3   Title  Pt will tolerate wearing prosthesis >/=60% of awake hours in order to increase pts functional mobility.      Time  4    Period  Weeks    Status  On-going    Target Date  07/07/18      PT SHORT TERM GOAL #4   Title  Pt will ambulate x 150' with RW and prosthesis at S level over indoor surfaces in order to indicate safe household ambulation.     Time  4    Period  Weeks    Status  On-going    Target Date  07/07/18      PT SHORT TERM GOAL #5   Title  Pt will improve gait speed w/ LRAD to >/=1.65 ft/sec in order to indicate decreased fall risk.     Time  4    Period  Weeks    Status  On-going    Target Date  07/07/18        PT Long Term Goals - 06/16/18 1949      PT LONG TERM GOAL #1   Title  Pt will be independent with HEP along with prosthetic care in order to indicate improved functional mobility and safety with prosthesis.  (Target Date: 09/05/17)    Time  12    Period  Weeks    Status  On-going    Target Date  09/05/17      PT LONG TERM GOAL #2   Title  Pt will tolerate wearing prosthesis >/=90% of awake hours without negative skin complications.     Time  12  Period  Weeks    Status  On-going    Target Date  09/05/17      PT LONG TERM GOAL #3   Title  Pt will ambulate w/ LRAD >500' over indoor and unlevel paved outdoor surfaces at mod I level in order to indicate safe home and limited community negotiation.     Time  12    Period  Weeks    Status  On-going    Target Date  09/05/17      PT LONG TERM GOAL #4   Title  Pt will improve BERG balance score to >/=28/56 in order to indicate decreased fall risk.     Time  12    Period  Weeks    Status  On-going    Target Date  09/05/17      PT LONG TERM GOAL #5   Title  Pt will improve gait speed to >/=2.25 ft/sec w/ LRAD in order to indicate decreased fall risk.      Time  12    Period  Weeks    Status  On-going    Target Date  09/05/17            Plan - 07/01/18 1246    Rehab Potential  Good     Clinical Impairments Affecting Rehab Potential  Pt motivated to progress and become as independent as possible, limited by number of co-morbidities    PT Frequency  2x / week    PT Duration  12 weeks    PT Treatment/Interventions  ADLs/Self Care Home Management;DME Instruction;Gait training;Stair training;Functional mobility training;Therapeutic activities;Therapeutic exercise;Balance training;Neuromuscular re-education;Prosthetic Training;Passive range of motion;Energy conservation;Vestibular    PT Next Visit Plan  Continue gait training, prosthetci care eductation, stairs/ramps and balance activities. Work on [employer], [employer].    Consulted and Agree with Plan of Care  Patient;Family member/caregiver    Family Member Consulted  son       Patient will benefit from skilled therapeutic intervention in order to improve the following deficits and impairments:  Abnormal gait, Decreased activity tolerance, Decreased balance, Decreased endurance, Decreased knowledge of precautions, Decreased knowledge of use of DME, Decreased mobility, Decreased strength, Difficulty walking, Impaired perceived functional ability, Impaired flexibility, Impaired sensation, Prosthetic Dependency, Postural dysfunction  Visit Diagnosis: Other abnormalities of gait and mobility  Unsteadiness on feet  Muscle weakness (generalized)  Abnormal posture     Problem List Patient Active Problem List   Diagnosis Date Noted  . Hx of BKA, left ([place]) 01/13/2018  . Nausea and vomiting in adult 12/26/2017  . Pressure injury of skin 12/23/2017  . C. difficile colitis 12/22/2017  . UTI (urinary tract infection) 12/22/2017  . Hyponatremia 12/22/2017  . Normocytic anemia 12/22/2017  . Chronic anticoagulation 09/26/2015  . Persistent atrial fibrillation 09/26/2015  . PVD (peripheral vascular disease) ([place]) 06/02/2012  . BPH (benign prostatic hyperplasia) 11/16/2011  . AKI (acute kidney injury) ([place]) 08/26/2011  .  Coronary artery disease 03/25/2011  . OBSTRUCTIVE SLEEP APNEA 09/15/2010  . PERIODIC LIMB MOVEMENT DISORDER 09/15/2010  . PERSONAL HISTORY OF COLONIC POLYPS 10/25/2009  . VITAMIN B12 DEFICIENCY 03/04/2009  . HYPERTRIGLYCERIDEMIA 11/24/2006  . Essential hypertension 11/24/2006    Cecile Sheerer, SPTA 07/01/2018, 12:52 PM  [facility] [address] [place], [place], [address] Phone: [phone]   Fax:  [phone]  Name: Eric [name]. MRN: [id number] Date of Birth: [birth year]

## 2018-07-04 ENCOUNTER — Ambulatory Visit: Payer: PPO | Admitting: Physical Therapy

## 2018-07-04 DIAGNOSIS — R2689 Other abnormalities of gait and mobility: Secondary | ICD-10-CM

## 2018-07-04 DIAGNOSIS — R2681 Unsteadiness on feet: Secondary | ICD-10-CM

## 2018-07-04 DIAGNOSIS — R293 Abnormal posture: Secondary | ICD-10-CM

## 2018-07-04 DIAGNOSIS — M6281 Muscle weakness (generalized): Secondary | ICD-10-CM

## 2018-07-04 MED ORDER — PENTOXIFYLLINE ER 400 MG PO TBCR: 400.0000 mg | EXTENDED_RELEASE_TABLET | Freq: Three times a day (TID) | ORAL | 3 refills | Status: DC

## 2018-07-04 NOTE — Therapy (Signed)
[facility] [address] [place], [place], [address] Phone: [phone]   Fax:  [phone]  Physical Therapy Treatment  Patient Details  Name: Eric [name]. MRN: [id number] Date of Birth: [birth year] Referring Provider (PT): Leotis Pain, MD   Encounter Date: 07/04/2018  PT End of Session - 07/04/18 0944    Visit Number  9    Number of Visits  25    Date for PT Re-Evaluation  09/05/18    Authorization Type  [employer]-Needs 10th visit PN    PT Start Time  0848    PT Stop Time  0930    PT Time Calculation (min)  42 min    Equipment Utilized During Treatment  Gait belt    Activity Tolerance  Patient tolerated treatment well    Behavior During Therapy  WFL for tasks assessed/performed       Past Medical History:  Diagnosis Date  . Atrial flutter ([place])    s/p RFCA  . CAD (coronary artery disease)    cath 2003, occluded S-RCA, occluded S-Dx, L-LAD ok, s/p PTCA to LAD  . Cataract   . CKD (chronic kidney disease) stage 3, GFR 30-59 ml/min (HCC)   . Diabetes mellitus    diet controlled  . Hearing aid worn    bilateral  . History of kidney stones   . Long term (current) use of anticoagulants   . Neuromuscular disorder ([place])   . OSA (obstructive sleep apnea) 12/11   very mild, AHI 7/hr  . Persistent atrial fibrillation 09/26/2015  . Pure hyperglyceridemia   . PVD (peripheral vascular disease) ([place])    angioplasty of his right lower extremity in [place] by Dr.[name] 2013  . Unspecified essential hypertension   . Wears dentures    full upper and lower    Past Surgical History:  Procedure Laterality Date  . ABDOMINAL AORTOGRAM W/LOWER EXTREMITY N/A 11/15/2017   Procedure: ABDOMINAL AORTOGRAM W/LOWER EXTREMITY;  Surgeon: [name], MD;  Location: [facility];  Service: Cardiovascular;  Laterality: N/A;  . Adenosine Myoview  3/06   EF 56%, neg. Ischemia  . Adenosine Myoview  02/18/07    nml  . AMPUTATION Left 12/15/2017   Procedure: AMPUTATION BELOW KNEE;  Surgeon: Algernon [name], MD;  Location: [facility];  Service: Vascular;  Laterality: Left;  . ANGIOPLASTY  1/99   CAD- diogonal with rotational artherectomy  . Arthrectomy     of LAD & PTCA  . BLEPHAROPLASTY Bilateral   . CARDIAC CATHETERIZATION  1/00  . CARDIOVERSION  1/04  . CARDIOVERSION  5/07   hospital- a flutter  . CARPAL TUNNEL RELEASE     ? bilateral  . CATARACT EXTRACTION W/PHACO Right 07/28/2017   Procedure: CATARACT EXTRACTION PHACO AND INTRAOCULAR LENS PLACEMENT ([place]) RIGHT DIABETIC;  Surgeon: [name], MD;  Location: [facility];  Service: Ophthalmology;  Laterality: Right;  Diabetic - diet controlled  . CATARACT EXTRACTION W/PHACO Left 08/18/2017   Procedure: CATARACT EXTRACTION PHACO AND INTRAOCULAR LENS PLACEMENT ([facility]);  Surgeon: [name], MD;  Location: [facility];  Service: Ophthalmology;  Laterality: Left;  DIABETES - oral meds  . COLONOSCOPY W/ BIOPSIES  10/01/06   sigmoid polyp bx neg, 3 years  . CORONARY ANGIOPLASTY  4/03   cutting balloon PTCA pLAD into Diag  . CORONARY ARTERY BYPASS GRAFT  2000   LIMA-LAD, SVG-RCA, SVG-Diag; SVG-Diag & SVG-RCA occluded 2003  . FRACTURE SURGERY    .  HAND SURGERY  08/27/09   R thumb procedure wit Scaphoid Gragt and screws, Dr [name]  . NM MYOVIEW LTD  4/11   normal  . PERIPHERAL VASCULAR CATHETERIZATION Right 06/27/2015   Procedure: Lower Extremity Angiography;  Surgeon: Algernon [name], MD;  Location: [facility];  Service: Cardiovascular;  Laterality: Right;  . PERIPHERAL VASCULAR CATHETERIZATION  06/27/2015   Procedure: Lower Extremity Intervention;  Surgeon: Algernon [name], MD;  Location: [facility];  Service: Cardiovascular;;    There were no vitals filed for this visit.  Subjective Assessment - 07/04/18 0853    Subjective  He has not fallen. He has been using RW to access community with his wife and drove  to PT today as his son was busy.     Patient is accompained by:  Family member    Pertinent History  L TTA, CAD, A-Fib, CKD3, DM2, PAD, HTN, HLD    Patient Stated Goals  He wants to use prosthesis so can walk & no longer needs his kids to transport & assist.     Currently in Pain?  No/denies                       [facility] Adult PT Treatment/Exercise - 07/04/18 0935      Transfers   Transfers  Sit to Stand;Stand to Sit    Sit to Stand  6: Modified independent (Device/Increase time);With upper extremity assist;With armrests;From chair/3-in-1   to RW   Sit to Stand Details  --    Sit to Stand Details (indicate cue type and reason)  Pt requires increased time and use of BLE    Stand to Sit  6: Modified independent (Device/Increase time);With upper extremity assist;With armrests;To chair/3-in-1   from RW   Comments  Pt performed x3 reps from chair with armrests; 2x to RW, 1x to cane. Pt requires use of BUE to perfrorm transfer and displayed no LOB today.      Ambulation/Gait   Ambulation/Gait  Yes    Ambulation/Gait Assistance  5: Supervision;4: Min assist    Ambulation/Gait Assistance Details  Pt ambulated at supervision level with RW and visual markers for step width. Pt ambulated with SPC at minA due to instability of hips and narrowed BOS.    Ambulation Distance (Feet)  350 Feet   350' X 2 with RW and 100' X 2 with cane   Assistive device  Rolling walker;Straight cane;Prosthesis    Ambulation Surface  Indoor;Level    Ramp  5: Supervision   RW &  prosthesis   Ramp Details (indicate cue type and reason)  verbal cues for sequence and safety.    Curb  5: Supervision   RW & prosthesis   Curb Details (indicate cue type and reason)  verbal cues for safety. Pt caught RW on edge of ramp but was able to maintain balance.    Gait Comments  Pt met STG with walker today with improved gait speed. RW height was adjusted and pt demonstrated improved posture and step. Pt ambulated with  cane but demonstrated 2xLOB with minA to correct.      Self-Care   Self-Care  --      Therapeutic Activites    Therapeutic Activities  Other Therapeutic Activities    Other Therapeutic Activities  PT demo, instructed in loading /unloading RW into car      Prosthetics   Prosthetic Care Comments   PT instructed in proper cleaning and  how to remove oil based substances that get imbedded into silicon area.  PT attempted to remove dry scabs but still too adhered.  PT advised to increase wear to all awake hours except one hour mid-day and dry limb/liner half way both wears.      Current prosthetic wear tolerance (days/week)   daily    Current prosthetic wear tolerance (#hours/day)   5-6 hrs x2    Residual limb condition   3 dry scaps on incision; small redness on left side, suture appears to be surfacing.    Education Provided  Skin check;Residual limb care;Prosthetic cleaning;Correct ply sock adjustment;Proper Donning;Proper wear schedule/adjustment    Person(s) Educated  Patient;Spouse    Education Method  Explanation;Demonstration;Tactile cues;Verbal cues    Education Method  Verbalized understanding;Needs further instruction;Returned Optician, dispensing Prosthesis  Supervision   alignment of pin            PT Education - 07/04/18 0944    Education Details  See prosthetic care comments    Person(s) Educated  Patient;Spouse    Methods  Explanation;Demonstration;Verbal cues;Tactile cues    Comprehension  Verbalized understanding;Need further instruction       PT Short Term Goals - 07/04/18 0854      PT SHORT TERM GOAL #1   Title  Pt will verbalize/demonstrate understanding of prosthetic care and appropriate wear time/ply sock adjustment to increase independence with functional mobility.  (Target Date: 07/07/18-please make sure and set new STGs once these are checked)    Baseline  07/04/2018  Pt verbalized proper prosthetic care.    Time  4    Period  Weeks    Status   Achieved      PT SHORT TERM GOAL #2   Title  Pt will improve BERG balance test to 20/56 in order to indicate decreased fall risk.      Time  4    Period  Weeks    Status  On-going    Target Date  07/07/18      PT SHORT TERM GOAL #3   Title  Pt will tolerate wearing prosthesis >/=60% of awake hours in order to increase pts functional mobility.      Baseline  MET 07/04/2018  patient wearing prothesis 8-10 of his 14 hr day.     Status  Achieved      PT SHORT TERM GOAL #4   Title  Pt will ambulate x 150' with RW and prosthesis at S level over indoor surfaces in order to indicate safe household ambulation.     Baseline  230 ft with RW; 07/04/2018    Time  4    Period  Weeks    Status  Achieved      PT SHORT TERM GOAL #5   Title  Pt will improve gait speed w/ LRAD to >/=1.65 ft/sec in order to indicate decreased fall risk.     Baseline  2.39 ft/sec with RW; 07/04/2018    Time  4    Status  Achieved        PT Long Term Goals - 06/16/18 1949      PT LONG TERM GOAL #1   Title  Pt will be independent with HEP along with prosthetic care in order to indicate improved functional mobility and safety with prosthesis.  (Target Date: 09/05/17)    Time  12    Period  Weeks    Status  On-going    Target Date  09/05/17  PT LONG TERM GOAL #2   Title  Pt will tolerate wearing prosthesis >/=90% of awake hours without negative skin complications.     Time  12    Period  Weeks    Status  On-going    Target Date  09/05/17      PT LONG TERM GOAL #3   Title  Pt will ambulate w/ LRAD >500' over indoor and unlevel paved outdoor surfaces at mod I level in order to indicate safe home and limited community negotiation.     Time  12    Period  Weeks    Status  On-going    Target Date  09/05/17      PT LONG TERM GOAL #4   Title  Pt will improve BERG balance score to >/=28/56 in order to indicate decreased fall risk.     Time  12    Period  Weeks    Status  On-going    Target Date  09/05/17       PT LONG TERM GOAL #5   Title  Pt will improve gait speed to >/=2.25 ft/sec w/ LRAD in order to indicate decreased fall risk.      Time  12    Period  Weeks    Status  On-going    Target Date  09/05/17            Plan - 07/04/18 0945    Clinical Impression Statement  Todays treatment involved checking STGs, prosthetic care, and gait training with cane. Pt is progressing ( See STGs). Pt was educated to continue to use RW due to mild instability while using cane. Pt would benefit from further physical therapy to improve gait with LRAD and improve funtional independence.    Clinical Presentation  Evolving    Clinical Decision Making  Moderate    Rehab Potential  Good    Clinical Impairments Affecting Rehab Potential  Pt motivated to progress and become as independent as possible, limited by number of co-morbidities    PT Frequency  2x / week    PT Duration  12 weeks    PT Treatment/Interventions  ADLs/Self Care Home Management;DME Instruction;Gait training;Stair training;Functional mobility training;Therapeutic activities;Therapeutic exercise;Balance training;Neuromuscular re-education;Prosthetic Training;Passive range of motion;Energy conservation;Vestibular    PT Next Visit Plan  Do berg (STG), have PT set updated STGs,  continue gait training with cane, ramps, curbs, and stair.    Consulted and Agree with Plan of Care  Patient;Family member/caregiver    Family Member Consulted  spouse.       Patient will benefit from skilled therapeutic intervention in order to improve the following deficits and impairments:  Abnormal gait, Decreased activity tolerance, Decreased balance, Decreased endurance, Decreased knowledge of precautions, Decreased knowledge of use of DME, Decreased mobility, Decreased strength, Difficulty walking, Impaired perceived functional ability, Impaired flexibility, Impaired sensation, Prosthetic Dependency, Postural dysfunction  Visit Diagnosis: Unsteadiness on  feet  Other abnormalities of gait and mobility  Muscle weakness (generalized)  Abnormal posture     Problem List Patient Active Problem List   Diagnosis Date Noted  . Hx of BKA, left ([place]) 01/13/2018  . Nausea and vomiting in adult 12/26/2017  . Pressure injury of skin 12/23/2017  . C. difficile colitis 12/22/2017  . UTI (urinary tract infection) 12/22/2017  . Hyponatremia 12/22/2017  . Normocytic anemia 12/22/2017  . Chronic anticoagulation 09/26/2015  . Persistent atrial fibrillation 09/26/2015  . PVD (peripheral vascular disease) ([place]) 06/02/2012  . BPH (benign prostatic hyperplasia)  11/16/2011  . AKI (acute kidney injury) ([place]) 08/26/2011  . Coronary artery disease 03/25/2011  . OBSTRUCTIVE SLEEP APNEA 09/15/2010  . PERIODIC LIMB MOVEMENT DISORDER 09/15/2010  . PERSONAL HISTORY OF COLONIC POLYPS 10/25/2009  . VITAMIN B12 DEFICIENCY 03/04/2009  . HYPERTRIGLYCERIDEMIA 11/24/2006  . Essential hypertension 11/24/2006   Arsal Tappan SPTA 07/04/2018, 9:49 AM  Jamey Reas PT, DPT 07/04/2018, 12:06 PM  [facility] [address] [place], [place], [address] Phone: [phone]   Fax:  [phone]  Name: Eric [name]. MRN: [id number] Date of Birth: 09-30-1927

## 2018-07-06 ENCOUNTER — Ambulatory Visit: Payer: PPO | Admitting: Physical Therapy

## 2018-07-06 DIAGNOSIS — R2681 Unsteadiness on feet: Secondary | ICD-10-CM

## 2018-07-06 DIAGNOSIS — R2689 Other abnormalities of gait and mobility: Secondary | ICD-10-CM

## 2018-07-06 NOTE — Therapy (Addendum)
[facility] [address] [place], [place], [address] Phone: [phone]   Fax:  [phone]   Progress Note Reporting Period 06/07/2018 to 07/06/2018  See note below for Objective Data and Assessment of Progress/Goals.   PT Short Term Goals - 07/11/18 0630      PT SHORT TERM GOAL #1   Title  Patient verbalizes adjusting ply socks with limb volume changes. (All STGs updated Target Date: 08/05/2018)    Time  1    Period  Months    Status  New    Target Date  08/05/18      PT SHORT TERM GOAL #2   Title  Berg Balance >36/56    Time  1    Period  Months    Status  New    Target Date  08/05/18      PT SHORT TERM GOAL #3   Title  Patient tolerates wear >12hrs total/day.     Time  1    Period  Months    Status  Revised    Target Date  08/05/18      PT SHORT TERM GOAL #4   Title  Patient ambulates 200' with cane & prosthesis with minA.     Time  1    Period  Months    Status  New    Target Date  08/05/18      PT SHORT TERM GOAL #5   Title  Patient negotiates ramps & curbs with cane with minA.     Time  1    Period  Months    Status  New    Target Date  08/05/18      PT Long Term Goals - 07/11/18 6546      PT LONG TERM GOAL #1   Title  Pt will be independent with HEP along with prosthetic care in order to indicate improved functional mobility and safety with prosthesis.  (Target Date: 09/05/17)    Time  12    Period  Weeks    Status  On-going    Target Date  09/05/17      PT LONG TERM GOAL #2   Title  Pt will tolerate wearing prosthesis >/=90% of awake hours without negative skin complications.     Time  12    Period  Weeks    Status  On-going    Target Date  09/05/17      PT LONG TERM GOAL #3   Title  Pt will ambulate w/ LRAD >500' over indoor and unlevel paved outdoor surfaces at mod I level in order to indicate safe home and limited community negotiation.     Time  12    Period  Weeks    Status   On-going    Target Date  09/05/17      PT LONG TERM GOAL #4   Title  Pt will improve BERG balance score to >/=45/56 in order to indicate decreased fall risk.     Time  12    Period  Weeks    Status  Revised      PT LONG TERM GOAL #5   Title  Patient negotiates ramps, curbs & stairs with LRAD & prosthesis modified Independent.     Time  12    Period  Weeks    Status  New    Target Date  09/05/17      Jamey Reas, PT, DPT PT Specializing in Prosthetics &  Orthotics 07/11/18 6:36 AM Phone:  [phone]  Fax:  [phone] [facility] [address] [place], [place] [address]       Physical Therapy Treatment  Patient Details  Name: Eric [name]. MRN: [id number] Date of Birth: 1928-05-15 Referring Provider (PT): Leotis Pain, MD   Encounter Date: 07/06/2018  PT End of Session - 07/06/18 0948    Visit Number  10    Number of Visits  25    Date for PT Re-Evaluation  09/05/18    Authorization Type  [employer]-Needs 10th visit PN    PT Start Time  0845    PT Stop Time  0930    PT Time Calculation (min)  45 min    Equipment Utilized During Treatment  Gait belt    Activity Tolerance  Patient tolerated treatment well    Behavior During Therapy  WFL for tasks assessed/performed       Past Medical History:  Diagnosis Date  . Atrial flutter ([facility])    s/p RFCA  . CAD (coronary artery disease)    cath 2003, occluded S-RCA, occluded S-Dx, L-LAD ok, s/p PTCA to LAD  . Cataract   . CKD (chronic kidney disease) stage 3, GFR 30-59 ml/min ([facility])   . Diabetes mellitus    diet controlled  . Hearing aid worn    bilateral  . History of kidney stones   . Long term (current) use of anticoagulants   . Neuromuscular disorder ([facility])   . OSA (obstructive sleep apnea) 12/11   very mild, AHI 7/hr  . Persistent atrial fibrillation 09/26/2015  . Pure hyperglyceridemia   . PVD (peripheral vascular disease) ([facility])    angioplasty of his right lower  extremity in [place] by Dr.[name] 2013  . Unspecified essential hypertension   . Wears dentures    full upper and lower    Past Surgical History:  Procedure Laterality Date  . ABDOMINAL AORTOGRAM W/LOWER EXTREMITY N/A 11/15/2017   Procedure: ABDOMINAL AORTOGRAM W/LOWER EXTREMITY;  Surgeon: [name], MD;  Location: [facility];  Service: Cardiovascular;  Laterality: N/A;  . Adenosine Myoview  3/06   EF 56%, neg. Ischemia  . Adenosine Myoview  02/18/07   nml  . AMPUTATION Left 12/15/2017   Procedure: AMPUTATION BELOW KNEE;  Surgeon: Algernon [name], MD;  Location: [facility];  Service: Vascular;  Laterality: Left;  . ANGIOPLASTY  1/99   CAD- diogonal with rotational artherectomy  . Arthrectomy     of LAD & PTCA  . BLEPHAROPLASTY Bilateral   . CARDIAC CATHETERIZATION  1/00  . CARDIOVERSION  1/04  . CARDIOVERSION  5/07   hospital- a flutter  . CARPAL TUNNEL RELEASE     ? bilateral  . CATARACT EXTRACTION W/PHACO Right 07/28/2017   Procedure: CATARACT EXTRACTION PHACO AND INTRAOCULAR LENS PLACEMENT ([facility]) RIGHT DIABETIC;  Surgeon: [name], MD;  Location: [facility];  Service: Ophthalmology;  Laterality: Right;  Diabetic - diet controlled  . CATARACT EXTRACTION W/PHACO Left 08/18/2017   Procedure: CATARACT EXTRACTION PHACO AND INTRAOCULAR LENS PLACEMENT ([facility]);  Surgeon: [name], MD;  Location: [facility];  Service: Ophthalmology;  Laterality: Left;  DIABETES - oral meds  . COLONOSCOPY W/ BIOPSIES  10/01/06   sigmoid polyp bx neg, 3 years  . CORONARY ANGIOPLASTY  4/03   cutting balloon PTCA pLAD into Diag  . CORONARY ARTERY BYPASS GRAFT  2000   LIMA-LAD, SVG-RCA, SVG-Diag; SVG-Diag & SVG-RCA occluded 2003  .  FRACTURE SURGERY    . HAND SURGERY  08/27/09   R thumb procedure wit Scaphoid Gragt and screws, Dr [name]  . NM MYOVIEW LTD  4/11   normal  . PERIPHERAL VASCULAR CATHETERIZATION Right 06/27/2015   Procedure: Lower Extremity  Angiography;  Surgeon: Algernon [name], MD;  Location: [facility];  Service: Cardiovascular;  Laterality: Right;  . PERIPHERAL VASCULAR CATHETERIZATION  06/27/2015   Procedure: Lower Extremity Intervention;  Surgeon: Algernon [name], MD;  Location: [facility];  Service: Cardiovascular;;    There were no vitals filed for this visit.  Subjective Assessment - 07/06/18 0852    Subjective  He has not had any falls since last visit. He is scheduled to see biotech later this morning and feels his socket is rubbing in the wrong place ( patella)    Patient is accompained by:  Family member    Pertinent History  L TTA, CAD, A-Fib, CKD3, DM2, PAD, HTN, HLD    Patient Stated Goals  He wants to use prosthesis so can walk & no longer needs his kids to transport & assist.     Currently in Pain?  No/denies         [facility] PT Assessment - 07/06/18 0854      Berg Balance Test   Sit to Stand  Able to stand  independently using hands    Standing Unsupported  Able to stand safely 2 minutes    Sitting with Back Unsupported but Feet Supported on Floor or Stool  Able to sit safely and securely 2 minutes    Stand to Sit  Controls descent by using hands    Transfers  Able to transfer safely, definite need of hands    Standing Unsupported with Eyes Closed  Able to stand 10 seconds with supervision    Standing Ubsupported with Feet Together  Able to place feet together independently and stand for 1 minute with supervision    From Standing, Reach Forward with Outstretched Arm  Reaches forward but needs supervision    From Standing Position, Pick up Object from [place] to pick up shoe, needs supervision    From Standing Position, Turn to Look Behind Over each Shoulder  Needs supervision when turning    Turn 360 Degrees  Needs close supervision or verbal cueing    Standing Unsupported, Alternately Place Feet on Step/Stool  Needs assistance to keep from falling or unable to try    Standing Unsupported,  One Foot in Front  Loses balance while stepping or standing    Standing on One Leg  Tries to lift leg/unable to hold 3 seconds but remains standing independently    Total Score  30    Berg comment:  07/06/2018          [facility] Adult PT Treatment/Exercise - 07/06/18 0909      Ambulation/Gait   Ambulation/Gait  Yes    Ambulation/Gait Assistance  5: Supervision;4: Min assist    Ambulation/Gait Assistance Details  Pt demonstrated unsteadiness requiring min assist to maintain balance while negotiating tunrs    Ambulation Distance (Feet)  115 Feet    Assistive device  Straight cane;Prosthesis    Gait Pattern  Step-through pattern;Decreased stance time - right;Decreased weight shift to left;Trunk flexed;Left flexed knee in stance    Ambulation Surface  Level;Indoor    Ramp  --    Curb  --    Gait Comments  Gait trained with cane today with min  LOB around curves. Pt ambulated around obstacles with instruction for decreased step length with LE on inside and increased step length with LE on outside.      Prosthetics   Prosthetic Care Comments   Reviewed skin care and proper wear time with patient.     Current prosthetic wear tolerance (days/week)   daily    Current prosthetic wear tolerance (#hours/day)   Most of day with two hour break sometime after 5-6 hours.    Residual limb condition   3 dry scaps on incision; small redness on left side, suture appears to be surfacing.    Education Provided  Skin check;Residual limb care;Prosthetic cleaning;Correct ply sock adjustment;Proper Donning;Proper wear schedule/adjustment    Person(s) Educated  Patient;Spouse    Education Method  Explanation    Education Method  Verbalized understanding         PT Short Term Goals - 07/06/18 0909      PT SHORT TERM GOAL #1   Title  Pt will verbalize/demonstrate understanding of prosthetic care and appropriate wear time/ply sock adjustment to increase independence with functional mobility.  (Target Date:  07/07/18-please make sure and set new STGs once these are checked)    Baseline  07/04/2018  Pt verbalized proper prosthetic care.    Time  --    Period  --    Status  Achieved      PT SHORT TERM GOAL #2   Title  Pt will improve BERG balance test to 20/56 in order to indicate decreased fall risk.      Baseline  07/06/18: 30/56 scored today    Time  --    Period  --    Status  Achieved      PT SHORT TERM GOAL #3   Title  Pt will tolerate wearing prosthesis >/=60% of awake hours in order to increase pts functional mobility.      Baseline  MET 07/04/2018  patient wearing prothesis 8-10 of his 14 hr day.     Time  --    Period  --    Status  Achieved      PT SHORT TERM GOAL #4   Title  Pt will ambulate x 150' with RW and prosthesis at S level over indoor surfaces in order to indicate safe household ambulation.     Baseline  230 ft with RW; 07/04/2018    Time  --    Period  --    Status  Achieved      PT SHORT TERM GOAL #5   Title  Pt will improve gait speed w/ LRAD to >/=1.65 ft/sec in order to indicate decreased fall risk.     Baseline  2.39 ft/sec with RW; 07/04/2018    Time  --    Period  --    Status  Achieved           PT Long Term Goals - 06/16/18 1949      PT LONG TERM GOAL #1   Title  Pt will be independent with HEP along with prosthetic care in order to indicate improved functional mobility and safety with prosthesis.  (Target Date: 09/05/17)    Time  12    Period  Weeks    Status  On-going    Target Date  09/05/17      PT LONG TERM GOAL #2   Title  Pt will tolerate wearing prosthesis >/=90% of awake hours without negative skin complications.     Time  12    Period  Weeks    Status  On-going    Target Date  09/05/17      PT LONG TERM GOAL #3   Title  Pt will ambulate w/ LRAD >500' over indoor and unlevel paved outdoor surfaces at mod I level in order to indicate safe home and limited community negotiation.     Time  12    Period  Weeks    Status   On-going    Target Date  09/05/17      PT LONG TERM GOAL #4   Title  Pt will improve BERG balance score to >/=28/56 in order to indicate decreased fall risk.     Time  12    Period  Weeks    Status  On-going    Target Date  09/05/17      PT LONG TERM GOAL #5   Title  Pt will improve gait speed to >/=2.25 ft/sec w/ LRAD in order to indicate decreased fall risk.      Time  12    Period  Weeks    Status  On-going    Target Date  09/05/17            Plan - 07/06/18 0949    Clinical Impression Statement  Todays treatment focused on gait training with a cane and assessing pt's balance. Performed Berg Balance Test today and pt scored 30/56 reaching STG. Pt would beenfit from further physicall therapy to increase balance and improve gait.    Clinical Presentation  Evolving    Clinical Decision Making  Moderate    Rehab Potential  Good    PT Frequency  2x / week    PT Duration  12 weeks    PT Treatment/Interventions  ADLs/Self Care Home Management;DME Instruction;Gait training;Stair training;Functional mobility training;Therapeutic activities;Therapeutic exercise;Balance training;Neuromuscular re-education;Prosthetic Training;Passive range of motion;Energy conservation;Vestibular    PT Next Visit Plan  Pt stated he would like to use his rollator outdoors so he can feed his dogs again. Progress gait and balance training.    Consulted and Agree with Plan of Care  Patient       Patient will benefit from skilled therapeutic intervention in order to improve the following deficits and impairments:     Visit Diagnosis: Other abnormalities of gait and mobility  Unsteadiness on feet     Problem List Patient Active Problem List   Diagnosis Date Noted  . Hx of BKA, left ([facility]) 01/13/2018  . Nausea and vomiting in adult 12/26/2017  . Pressure injury of skin 12/23/2017  . C. difficile colitis 12/22/2017  . UTI (urinary tract infection) 12/22/2017  . Hyponatremia 12/22/2017  .  Normocytic anemia 12/22/2017  . Chronic anticoagulation 09/26/2015  . Persistent atrial fibrillation 09/26/2015  . PVD (peripheral vascular disease) ([facility]) 06/02/2012  . BPH (benign prostatic hyperplasia) 11/16/2011  . AKI (acute kidney injury) ([facility]) 08/26/2011  . Coronary artery disease 03/25/2011  . OBSTRUCTIVE SLEEP APNEA 09/15/2010  . PERIODIC LIMB MOVEMENT DISORDER 09/15/2010  . PERSONAL HISTORY OF COLONIC POLYPS 10/25/2009  . VITAMIN B12 DEFICIENCY 03/04/2009  . HYPERTRIGLYCERIDEMIA 11/24/2006  . Essential hypertension 11/24/2006    Donnald Garre SPTA 07/06/2018, 9:54 AM  [facility] [address] [place], [place], [address] Phone: [phone]   Fax:  [phone]  Name: Eric [name]. MRN: [id number] Date of Birth: [birth year]

## 2018-07-09 ENCOUNTER — Encounter: Payer: Self-pay | Admitting: Family Medicine

## 2018-07-11 ENCOUNTER — Encounter: Payer: Self-pay | Admitting: Physical Therapy

## 2018-07-11 ENCOUNTER — Ambulatory Visit: Payer: PPO | Attending: Vascular Surgery | Admitting: Physical Therapy

## 2018-07-11 DIAGNOSIS — R2681 Unsteadiness on feet: Secondary | ICD-10-CM | POA: Diagnosis not present

## 2018-07-11 DIAGNOSIS — R2689 Other abnormalities of gait and mobility: Secondary | ICD-10-CM | POA: Diagnosis not present

## 2018-07-11 DIAGNOSIS — R293 Abnormal posture: Secondary | ICD-10-CM | POA: Diagnosis not present

## 2018-07-11 DIAGNOSIS — M6281 Muscle weakness (generalized): Secondary | ICD-10-CM | POA: Insufficient documentation

## 2018-07-11 MED ORDER — ROSUVASTATIN CALCIUM 40 MG PO TABS: 40.0000 mg | ORAL_TABLET | Freq: Every day | ORAL | 2 refills | Status: DC

## 2018-07-11 NOTE — Therapy (Signed)
[facility] [address] [place], [place], [address] Phone: [phone]   Fax:  [phone]  Physical Therapy Treatment  Patient Details  Name: Eric [name]. MRN: [id number] Date of Birth: 18-Feb-1928 Referring Provider (PT): Leotis Pain, MD   Encounter Date: 07/11/2018  PT End of Session - 07/11/18 1228    Visit Number  11    Number of Visits  25    Date for PT Re-Evaluation  09/05/18    Authorization Type  [employer]-Needs 10th visit PN    PT Start Time  0930    PT Stop Time  1015    PT Time Calculation (min)  45 min    Equipment Utilized During Treatment  Gait belt    Activity Tolerance  Patient tolerated treatment well    Behavior During Therapy  WFL for tasks assessed/performed       Past Medical History:  Diagnosis Date  . Atrial flutter ([facility])    s/p RFCA  . CAD (coronary artery disease)    cath 2003, occluded S-RCA, occluded S-Dx, L-LAD ok, s/p PTCA to LAD  . Cataract   . CKD (chronic kidney disease) stage 3, GFR 30-59 ml/min (HCC)   . Diabetes mellitus    diet controlled  . Hearing aid worn    bilateral  . History of kidney stones   . Long term (current) use of anticoagulants   . Neuromuscular disorder ([facility])   . OSA (obstructive sleep apnea) 12/11   very mild, AHI 7/hr  . Persistent atrial fibrillation 09/26/2015  . Pure hyperglyceridemia   . PVD (peripheral vascular disease) ([place])    angioplasty of his right lower extremity in [place] by Dr.[name] 2013  . Unspecified essential hypertension   . Wears dentures    full upper and lower    Past Surgical History:  Procedure Laterality Date  . ABDOMINAL AORTOGRAM W/LOWER EXTREMITY N/A 11/15/2017   Procedure: ABDOMINAL AORTOGRAM W/LOWER EXTREMITY;  Surgeon: [name], MD;  Location: [facility];  Service: Cardiovascular;  Laterality: N/A;  . Adenosine Myoview  3/06   EF 56%, neg. Ischemia  . Adenosine Myoview  02/18/07   nml  . AMPUTATION Left 12/15/2017   Procedure: AMPUTATION BELOW KNEE;  Surgeon: Algernon [name], MD;  Location: [facility];  Service: Vascular;  Laterality: Left;  . ANGIOPLASTY  1/99   CAD- diogonal with rotational artherectomy  . Arthrectomy     of LAD & PTCA  . BLEPHAROPLASTY Bilateral   . CARDIAC CATHETERIZATION  1/00  . CARDIOVERSION  1/04  . CARDIOVERSION  5/07   hospital- a flutter  . CARPAL TUNNEL RELEASE     ? bilateral  . CATARACT EXTRACTION W/PHACO Right 07/28/2017   Procedure: CATARACT EXTRACTION PHACO AND INTRAOCULAR LENS PLACEMENT ([facility]) RIGHT DIABETIC;  Surgeon: [name], MD;  Location: [facility];  Service: Ophthalmology;  Laterality: Right;  Diabetic - diet controlled  . CATARACT EXTRACTION W/PHACO Left 08/18/2017   Procedure: CATARACT EXTRACTION PHACO AND INTRAOCULAR LENS PLACEMENT ([facility]);  Surgeon: [name], MD;  Location: [facility];  Service: Ophthalmology;  Laterality: Left;  DIABETES - oral meds  . COLONOSCOPY W/ BIOPSIES  10/01/06   sigmoid polyp bx neg, 3 years  . CORONARY ANGIOPLASTY  4/03   cutting balloon PTCA pLAD into Diag  . CORONARY ARTERY BYPASS GRAFT  2000   LIMA-LAD, SVG-RCA, SVG-Diag; SVG-Diag & SVG-RCA occluded 2003  . FRACTURE SURGERY    . HAND  SURGERY  08/27/09   R thumb procedure wit Scaphoid Gragt and screws, Dr [name]  . NM MYOVIEW LTD  4/11   normal  . PERIPHERAL VASCULAR CATHETERIZATION Right 06/27/2015   Procedure: Lower Extremity Angiography;  Surgeon: Algernon [name], MD;  Location: [facility];  Service: Cardiovascular;  Laterality: Right;  . PERIPHERAL VASCULAR CATHETERIZATION  06/27/2015   Procedure: Lower Extremity Intervention;  Surgeon: Algernon [name], MD;  Location: [facility];  Service: Cardiovascular;;    There were no vitals filed for this visit.  Subjective Assessment - 07/11/18 0929    Subjective  No falls. He is wearing prosthesis most of awake hours except 1-2 hours midday only  because that is what PT recommended    Patient is accompained by:  Family member    Pertinent History  L TTA, CAD, A-Fib, CKD3, DM2, PAD, HTN, HLD    Patient Stated Goals  He wants to use prosthesis so can walk & no longer needs his kids to transport & assist.     Currently in Pain?  No/denies                       [facility] Adult PT Treatment/Exercise - 07/11/18 0930      Ambulation/Gait   Ambulation/Gait  Yes    Ambulation/Gait Assistance  5: Supervision;4: Min assist    Ambulation/Gait Assistance Details  tactile & verbal cues on upright posture, step width & weight shift over prosthesis in stance.     Ambulation Distance (Feet)  250 Feet   250' X 2   Assistive device  Straight cane;Prosthesis    Gait Pattern  Step-through pattern;Decreased stance time - right;Decreased weight shift to left;Trunk flexed;Left flexed knee in stance    Ambulation Surface  Indoor;Level    Stairs  Yes    Stairs Assistance  5: Supervision    Stairs Assistance Details (indicate cue type and reason)  cues on alternate technique with TTA prosthesis    Stair Management Technique  Two rails;Alternating pattern;Forwards;One rail Left;With cane;Step to pattern    Number of Stairs  4   1 rep cane/rail step-to & 2 reps 2 rails alternating   Ramp  4: Min assist   cane & prosthesis   Ramp Details (indicate cue type and reason)  tactile & verbal cues on technique with cane & TTA prosthesis    Curb  4: Min assist   cane & prosthesis   Curb Details (indicate cue type and reason)  tactile & verbal cues on technique with cane & TTA prosthesis      High Level Balance   High Level Balance Activities  Negotiating over obstacles;Head turns   cane & prosthesis   High Level Balance Comments  tactile & verbal cues on technique with cane & TTA prosthesis      Self-Care   Self-Care  Lifting    Lifting  PT demo, instructed in technique with TTA prosthesis & recommends chair behind/RW or counter in front to  practice outside PT.  Pt performed 5 reps with 2 balance losses when arising.       Prosthetics   Prosthetic Care Comments   PT instructed in signs of sweat & need to dry limb/liner.  Increase wear to all awake hours & dry prn.     Current prosthetic wear tolerance (days/week)   daily    Current prosthetic wear tolerance (#hours/day)   most of awake hours except 1-2 hrs midday  Residual limb condition   1 dry scaps on incision; small redness on left side, suture appears to be surfacing.    Education Provided  Skin check;Residual limb care;Prosthetic cleaning;Correct ply sock adjustment;Proper Donning;Proper wear schedule/adjustment    Person(s) Educated  Patient;Spouse;Child(ren)    Education Method  Explanation;Verbal cues    Education Method  Verbalized understanding;Returned demonstration;Tactile cues required;Verbal cues required;Needs further instruction    Donning Prosthesis  Modified independent (device/increased time)               PT Short Term Goals - 07/11/18 0630      PT SHORT TERM GOAL #1   Title  Patient verbalizes adjusting ply socks with limb volume changes. (All STGs updated Target Date: 08/05/2018)    Time  1    Period  Months    Status  New    Target Date  08/05/18      PT SHORT TERM GOAL #2   Title  Berg Balance >36/56    Time  1    Period  Months    Status  New    Target Date  08/05/18      PT SHORT TERM GOAL #3   Title  Patient tolerates wear >12hrs total/day.     Time  1    Period  Months    Status  Revised    Target Date  08/05/18      PT SHORT TERM GOAL #4   Title  Patient ambulates 200' with cane & prosthesis with minA.     Time  1    Period  Months    Status  New    Target Date  08/05/18      PT SHORT TERM GOAL #5   Title  Patient negotiates ramps & curbs with cane with minA.     Time  1    Period  Months    Status  New    Target Date  08/05/18        PT Long Term Goals - 07/11/18 7169      PT LONG TERM GOAL #1   Title  Pt  will be independent with HEP along with prosthetic care in order to indicate improved functional mobility and safety with prosthesis.  (Target Date: 09/05/17)    Time  12    Period  Weeks    Status  On-going    Target Date  09/05/17      PT LONG TERM GOAL #2   Title  Pt will tolerate wearing prosthesis >/=90% of awake hours without negative skin complications.     Time  12    Period  Weeks    Status  On-going    Target Date  09/05/17      PT LONG TERM GOAL #3   Title  Pt will ambulate w/ LRAD >500' over indoor and unlevel paved outdoor surfaces at mod I level in order to indicate safe home and limited community negotiation.     Time  12    Period  Weeks    Status  On-going    Target Date  09/05/17      PT LONG TERM GOAL #4   Title  Pt will improve BERG balance score to >/=45/56 in order to indicate decreased fall risk.     Time  12    Period  Weeks    Status  Revised      PT LONG TERM GOAL #5   Title  Patient negotiates  ramps, curbs & stairs with LRAD & prosthesis modified Independent.     Time  12    Period  Weeks    Status  New    Target Date  09/05/17            Plan - 07/11/18 1228    Clinical Impression Statement  PT instructed patient & family in technique with TTA prosthesis in stepping over obstacles, picking up items from floor and negotiating stairs with 2 rails with alternate pattern. Patient has general understanding but needs additional training to improve independence and safety. His prosthetic gait with cane is also improving.     Rehab Potential  Good    PT Frequency  2x / week    PT Duration  12 weeks    PT Treatment/Interventions  ADLs/Self Care Home Management;DME Instruction;Gait training;Stair training;Functional mobility training;Therapeutic activities;Therapeutic exercise;Balance training;Neuromuscular re-education;Prosthetic Training;Passive range of motion;Energy conservation;Vestibular    PT Next Visit Plan  Pt stated he would like to use his  rollator outdoors so he can feed his dogs again. Progress gait and balance training.    Consulted and Agree with Plan of Care  Patient    Family Member Consulted  spouse.       Patient will benefit from skilled therapeutic intervention in order to improve the following deficits and impairments:     Visit Diagnosis: Other abnormalities of gait and mobility  Unsteadiness on feet  Muscle weakness (generalized)  Abnormal posture     Problem List Patient Active Problem List   Diagnosis Date Noted  . Hx of BKA, left ([facility]) 01/13/2018  . Nausea and vomiting in adult 12/26/2017  . Pressure injury of skin 12/23/2017  . C. difficile colitis 12/22/2017  . UTI (urinary tract infection) 12/22/2017  . Hyponatremia 12/22/2017  . Normocytic anemia 12/22/2017  . Chronic anticoagulation 09/26/2015  . Persistent atrial fibrillation 09/26/2015  . PVD (peripheral vascular disease) ([facility]) 06/02/2012  . BPH (benign prostatic hyperplasia) 11/16/2011  . AKI (acute kidney injury) ([facility]) 08/26/2011  . Coronary artery disease 03/25/2011  . OBSTRUCTIVE SLEEP APNEA 09/15/2010  . PERIODIC LIMB MOVEMENT DISORDER 09/15/2010  . PERSONAL HISTORY OF COLONIC POLYPS 10/25/2009  . VITAMIN B12 DEFICIENCY 03/04/2009  . HYPERTRIGLYCERIDEMIA 11/24/2006  . Essential hypertension 11/24/2006    Jamey Reas PT, DPT 07/11/2018, 12:38 PM  [facility] [address] [place], [place], [address] Phone: [phone]   Fax:  [phone]  Name: Eric [name]. MRN: [id number] Date of Birth: 05/18/1928

## 2018-07-13 ENCOUNTER — Ambulatory Visit: Payer: PPO | Admitting: Physical Therapy

## 2018-07-13 DIAGNOSIS — R2689 Other abnormalities of gait and mobility: Secondary | ICD-10-CM | POA: Diagnosis not present

## 2018-07-13 DIAGNOSIS — R2681 Unsteadiness on feet: Secondary | ICD-10-CM

## 2018-07-13 NOTE — Therapy (Signed)
[facility] [address] [place], [place], [address] Phone: [phone]   Fax:  [phone]  Physical Therapy Treatment  Patient Details  Name: Eric [name]. MRN: [id number] Date of Birth: [birth year] Referring Provider (PT): Leotis Pain, MD   Encounter Date: 07/13/2018  PT End of Session - 07/13/18 1150    Visit Number  12    Number of Visits  25    Date for PT Re-Evaluation  09/05/18    Authorization Type  [employer]-Needs 10th visit PN    PT Start Time  0845    PT Stop Time  0930    PT Time Calculation (min)  45 min    Equipment Utilized During Treatment  Gait belt    Activity Tolerance  Patient tolerated treatment well    Behavior During Therapy  WFL for tasks assessed/performed       Past Medical History:  Diagnosis Date  . Atrial flutter ([facility])    s/p RFCA  . CAD (coronary artery disease)    cath 2003, occluded S-RCA, occluded S-Dx, L-LAD ok, s/p PTCA to LAD  . Cataract   . CKD (chronic kidney disease) stage 3, GFR 30-59 ml/min ([facility])   . Diabetes mellitus    diet controlled  . Hearing aid worn    bilateral  . History of kidney stones   . Long term (current) use of anticoagulants   . Neuromuscular disorder ([facility])   . OSA (obstructive sleep apnea) 12/11   very mild, AHI 7/hr  . Persistent atrial fibrillation 09/26/2015  . Pure hyperglyceridemia   . PVD (peripheral vascular disease) ([facility])    angioplasty of his right lower extremity in [place] by Dr.[name] 2013  . Unspecified essential hypertension   . Wears dentures    full upper and lower    Past Surgical History:  Procedure Laterality Date  . ABDOMINAL AORTOGRAM W/LOWER EXTREMITY N/A 11/15/2017   Procedure: ABDOMINAL AORTOGRAM W/LOWER EXTREMITY;  Surgeon: [name], MD;  Location: [facility];  Service: Cardiovascular;  Laterality: N/A;  . Adenosine Myoview  3/06   EF 56%, neg. Ischemia  . Adenosine Myoview  02/18/07   nml  . AMPUTATION Left 12/15/2017   Procedure: AMPUTATION BELOW KNEE;  Surgeon: Algernon [name], MD;  Location: [facility];  Service: Vascular;  Laterality: Left;  . ANGIOPLASTY  1/99   CAD- diogonal with rotational artherectomy  . Arthrectomy     of LAD & PTCA  . BLEPHAROPLASTY Bilateral   . CARDIAC CATHETERIZATION  1/00  . CARDIOVERSION  1/04  . CARDIOVERSION  5/07   hospital- a flutter  . CARPAL TUNNEL RELEASE     ? bilateral  . CATARACT EXTRACTION W/PHACO Right 07/28/2017   Procedure: CATARACT EXTRACTION PHACO AND INTRAOCULAR LENS PLACEMENT ([facility]) RIGHT DIABETIC;  Surgeon: [name], MD;  Location: [facility];  Service: Ophthalmology;  Laterality: Right;  Diabetic - diet controlled  . CATARACT EXTRACTION W/PHACO Left 08/18/2017   Procedure: CATARACT EXTRACTION PHACO AND INTRAOCULAR LENS PLACEMENT ([facility]);  Surgeon: [name], MD;  Location: [facility];  Service: Ophthalmology;  Laterality: Left;  DIABETES - oral meds  . COLONOSCOPY W/ BIOPSIES  10/01/06   sigmoid polyp bx neg, 3 years  . CORONARY ANGIOPLASTY  4/03   cutting balloon PTCA pLAD into Diag  . CORONARY ARTERY BYPASS GRAFT  2000   LIMA-LAD, SVG-RCA, SVG-Diag; SVG-Diag & SVG-RCA occluded 2003  . FRACTURE SURGERY    . HAND  SURGERY  08/27/09   R thumb procedure wit Scaphoid Gragt and screws, Dr [name]  . NM MYOVIEW LTD  4/11   normal  . PERIPHERAL VASCULAR CATHETERIZATION Right 06/27/2015   Procedure: Lower Extremity Angiography;  Surgeon: Algernon [name], MD;  Location: [facility];  Service: Cardiovascular;  Laterality: Right;  . PERIPHERAL VASCULAR CATHETERIZATION  06/27/2015   Procedure: Lower Extremity Intervention;  Surgeon: Algernon [name], MD;  Location: [facility];  Service: Cardiovascular;;    There were no vitals filed for this visit.  Subjective Assessment - 07/13/18 0852    Subjective  He has not had any falls. He is hopeful his chair lift will be replaced soon so he  can get down to his dog. he has a 65 pound dog at home.    Patient is accompained by:  Family member    Pertinent History  L TTA, CAD, A-Fib, CKD3, DM2, PAD, HTN, HLD    Patient Stated Goals  He wants to use prosthesis so can walk & no longer needs his kids to transport & assist.     Currently in Pain?  No/denies                       [facility] Adult PT Treatment/Exercise - 07/13/18 0920      Transfers   Transfers  Sit to Stand;Stand to Sit    Sit to Stand  6: Modified independent (Device/Increase time);With upper extremity assist;With armrests;From chair/3-in-1      Ambulation/Gait   Ambulation/Gait  Yes    Ambulation/Gait Assistance  5: Supervision;4: Min assist    Ambulation Distance (Feet)  230 Feet   additional 100 ft during treatment   Assistive device  Straight cane;Prosthesis   single point and quad tip used.   Gait Pattern  Step-through pattern;Decreased stance time - right;Decreased weight shift to left;Trunk flexed;Left flexed knee in stance    Ambulation Surface  Level;Indoor    Stairs  --    Stairs Assistance  --    Stair Management Technique  --    Number of Stairs  --    Ramp  4: Min assist;3: Mod assist   cane & prosthesis   Ramp Details (indicate cue type and reason)  Pt experienced 1 xLOB requiring min assist for balance. Verbal cues and demonstration fiven to perform ramp.    Curb  4: Min assist   cane & prosthesis   Gait Comments  Pt ambulated with cane today for one lap with single point tip and one lap with quad tip point. Pt did not display significant difference in stability and prefered single tip.  During ramp training, pt experienced LOB due to toe catch of L LE requiring mod assist to regain balance. Pt required a rest break after fall due to discomfort in residual limb. Pt complained of a pulling senstion and the prosthesis was doffed/donned for alignment check. Pt performed ramp again at min assist levels and no LOB but continued complaint of  discomfort. Remainder of treatment focused on static tanding balance due to discomfort.      High Level Balance   High Level Balance Activities  Other (comment)   cane & prosthesis   High Level Balance Comments  Pt performed standing in corner on level ground standing static balance progressing to head turns horizontal/vertical for 30 sec each. Pt then performed standing balance with eyes closed and feet shoulder width apart for 30 sec. Pt required  intermittent use of UE and chair to maintain balance.       Prosthetics   Prosthetic Care Comments   Pt educated on benefit of using his RW outdoors in yard with his dog. Education given regarding if patient were to fall with possible broken prosthesis or bone to not stand and call for medical assistance.  Pt educated on what parts of prosthesis should have movement and if movement is occuring to call prosthetist to resolve issue.    Current prosthetic wear tolerance (days/week)   daily    Current prosthetic wear tolerance (#hours/day)   most of awake hours except 1-2 hrs midday    Residual limb condition   1 dry scaps on incision; small redness on left side, suture appears to be surfacing.    Education Provided  Skin check;Residual limb care;Prosthetic cleaning;Correct ply sock adjustment;Proper Donning;Proper wear schedule/adjustment             PT Education - 07/13/18 1149    Education Details  See prosthetic care comments.    Person(s) Educated  Patient;Child(ren)    Methods  Explanation;Verbal cues    Comprehension  Verbalized understanding;Need further instruction       PT Short Term Goals - 07/11/18 0630      PT SHORT TERM GOAL #1   Title  Patient verbalizes adjusting ply socks with limb volume changes. (All STGs updated Target Date: 08/05/2018)    Time  1    Period  Months    Status  New    Target Date  08/05/18      PT SHORT TERM GOAL #2   Title  Berg Balance >36/56    Time  1    Period  Months    Status  New    Target  Date  08/05/18      PT SHORT TERM GOAL #3   Title  Patient tolerates wear >12hrs total/day.     Time  1    Period  Months    Status  Revised    Target Date  08/05/18      PT SHORT TERM GOAL #4   Title  Patient ambulates 200' with cane & prosthesis with minA.     Time  1    Period  Months    Status  New    Target Date  08/05/18      PT SHORT TERM GOAL #5   Title  Patient negotiates ramps & curbs with cane with minA.     Time  1    Period  Months    Status  New    Target Date  08/05/18        PT Long Term Goals - 07/11/18 9702      PT LONG TERM GOAL #1   Title  Pt will be independent with HEP along with prosthetic care in order to indicate improved functional mobility and safety with prosthesis.  (Target Date: 09/05/17)    Time  12    Period  Weeks    Status  On-going    Target Date  09/05/17      PT LONG TERM GOAL #2   Title  Pt will tolerate wearing prosthesis >/=90% of awake hours without negative skin complications.     Time  12    Period  Weeks    Status  On-going    Target Date  09/05/17      PT LONG TERM GOAL #3   Title  Pt will  ambulate w/ LRAD >500' over indoor and unlevel paved outdoor surfaces at mod I level in order to indicate safe home and limited community negotiation.     Time  12    Period  Weeks    Status  On-going    Target Date  09/05/17      PT LONG TERM GOAL #4   Title  Pt will improve BERG balance score to >/=45/56 in order to indicate decreased fall risk.     Time  12    Period  Weeks    Status  Revised      PT LONG TERM GOAL #5   Title  Patient negotiates ramps, curbs & stairs with LRAD & prosthesis modified Independent.     Time  12    Period  Weeks    Status  New    Target Date  09/05/17            Plan - 07/13/18 1150    Clinical Impression Statement  Treatment today initially focused on gait training with cane. Pt experienced LOB while negotiating the ramp requiring mod assist to resestablish balance. During LOB pt  complained of a twisting of knee of L LE and treatment focused on balance activities to prevent further irritation. Pt would benefit from further physcial therapy to increase balance and improve functional gait.    Clinical Presentation  Evolving    Clinical Decision Making  Moderate    Clinical Impairments Affecting Rehab Potential  Pt motivated to progress and become as independent as possible, limited by number of co-morbidities    PT Frequency  2x / week    PT Duration  12 weeks    PT Treatment/Interventions  ADLs/Self Care Home Management;DME Instruction;Gait training;Stair training;Functional mobility training;Therapeutic activities;Therapeutic exercise;Balance training;Neuromuscular re-education;Prosthetic Training;Passive range of motion;Energy conservation;Vestibular    PT Next Visit Plan  Progress gait and balance trainging.    Consulted and Agree with Plan of Care  Patient;Family member/caregiver       Patient will benefit from skilled therapeutic intervention in order to improve the following deficits and impairments:     Visit Diagnosis: Other abnormalities of gait and mobility  Unsteadiness on feet     Problem List Patient Active Problem List   Diagnosis Date Noted  . Hx of BKA, left ([facility]) 01/13/2018  . Nausea and vomiting in adult 12/26/2017  . Pressure injury of skin 12/23/2017  . C. difficile colitis 12/22/2017  . UTI (urinary tract infection) 12/22/2017  . Hyponatremia 12/22/2017  . Normocytic anemia 12/22/2017  . Chronic anticoagulation 09/26/2015  . Persistent atrial fibrillation 09/26/2015  . PVD (peripheral vascular disease) ([facility]) 06/02/2012  . BPH (benign prostatic hyperplasia) 11/16/2011  . AKI (acute kidney injury) ([facility]) 08/26/2011  . Coronary artery disease 03/25/2011  . OBSTRUCTIVE SLEEP APNEA 09/15/2010  . PERIODIC LIMB MOVEMENT DISORDER 09/15/2010  . PERSONAL HISTORY OF COLONIC POLYPS 10/25/2009  . VITAMIN B12 DEFICIENCY 03/04/2009  .  HYPERTRIGLYCERIDEMIA 11/24/2006  . Essential hypertension 11/24/2006    Dameir Gentzler SPTA 07/13/2018, 11:58 AM  [facility] [address] [place], [place], [address] Phone: [phone]   Fax:  [phone]  Name: Asad Keeven. MRN: [id number] Date of Birth: 1928/06/03

## 2018-07-18 ENCOUNTER — Ambulatory Visit: Payer: PPO | Admitting: Rehabilitation

## 2018-07-18 ENCOUNTER — Encounter: Payer: Self-pay | Admitting: Rehabilitation

## 2018-07-18 DIAGNOSIS — R2681 Unsteadiness on feet: Secondary | ICD-10-CM

## 2018-07-18 DIAGNOSIS — M6281 Muscle weakness (generalized): Secondary | ICD-10-CM

## 2018-07-18 DIAGNOSIS — R293 Abnormal posture: Secondary | ICD-10-CM

## 2018-07-18 DIAGNOSIS — R2689 Other abnormalities of gait and mobility: Secondary | ICD-10-CM

## 2018-07-18 NOTE — Therapy (Signed)
[facility] [address] [place], [place], [address] Phone: [phone]   Fax:  [phone]  Physical Therapy Treatment  Patient Details  Name: Eric [name]. MRN: [id number] Date of Birth: 1928/02/05 Referring Provider (PT): Leotis Pain, MD   Encounter Date: 07/18/2018  PT End of Session - 07/18/18 0943    Visit Number  13    Number of Visits  25    Date for PT Re-Evaluation  09/05/18    Authorization Type  [employer]-Needs 10th visit PN    PT Start Time  0846    PT Stop Time  0930    PT Time Calculation (min)  44 min    Equipment Utilized During Treatment  Gait belt    Activity Tolerance  Patient tolerated treatment well    Behavior During Therapy  WFL for tasks assessed/performed       Past Medical History:  Diagnosis Date  . Atrial flutter ([place])    s/p RFCA  . CAD (coronary artery disease)    cath 2003, occluded S-RCA, occluded S-Dx, L-LAD ok, s/p PTCA to LAD  . Cataract   . CKD (chronic kidney disease) stage 3, GFR 30-59 ml/min (HCC)   . Diabetes mellitus    diet controlled  . Hearing aid worn    bilateral  . History of kidney stones   . Long term (current) use of anticoagulants   . Neuromuscular disorder ([place])   . OSA (obstructive sleep apnea) 12/11   very mild, AHI 7/hr  . Persistent atrial fibrillation 09/26/2015  . Pure hyperglyceridemia   . PVD (peripheral vascular disease) ([place])    angioplasty of his right lower extremity in [place] by Dr.[name] 2013  . Unspecified essential hypertension   . Wears dentures    full upper and lower    Past Surgical History:  Procedure Laterality Date  . ABDOMINAL AORTOGRAM W/LOWER EXTREMITY N/A 11/15/2017   Procedure: ABDOMINAL AORTOGRAM W/LOWER EXTREMITY;  Surgeon: [name], MD;  Location: [facility];  Service: Cardiovascular;  Laterality: N/A;  . Adenosine Myoview  3/06   EF 56%, neg. Ischemia  . Adenosine Myoview  02/18/07    nml  . AMPUTATION Left 12/15/2017   Procedure: AMPUTATION BELOW KNEE;  Surgeon: Algernon [name], MD;  Location: [facility];  Service: Vascular;  Laterality: Left;  . ANGIOPLASTY  1/99   CAD- diogonal with rotational artherectomy  . Arthrectomy     of LAD & PTCA  . BLEPHAROPLASTY Bilateral   . CARDIAC CATHETERIZATION  1/00  . CARDIOVERSION  1/04  . CARDIOVERSION  5/07   hospital- a flutter  . CARPAL TUNNEL RELEASE     ? bilateral  . CATARACT EXTRACTION W/PHACO Right 07/28/2017   Procedure: CATARACT EXTRACTION PHACO AND INTRAOCULAR LENS PLACEMENT ([place]) RIGHT DIABETIC;  Surgeon: [name], MD;  Location: [facility];  Service: Ophthalmology;  Laterality: Right;  Diabetic - diet controlled  . CATARACT EXTRACTION W/PHACO Left 08/18/2017   Procedure: CATARACT EXTRACTION PHACO AND INTRAOCULAR LENS PLACEMENT ([facility]);  Surgeon: [name], MD;  Location: [facility];  Service: Ophthalmology;  Laterality: Left;  DIABETES - oral meds  . COLONOSCOPY W/ BIOPSIES  10/01/06   sigmoid polyp bx neg, 3 years  . CORONARY ANGIOPLASTY  4/03   cutting balloon PTCA pLAD into Diag  . CORONARY ARTERY BYPASS GRAFT  2000   LIMA-LAD, SVG-RCA, SVG-Diag; SVG-Diag & SVG-RCA occluded 2003  . FRACTURE SURGERY    .  HAND SURGERY  08/27/09   R thumb procedure wit Scaphoid Gragt and screws, Dr [name]  . NM MYOVIEW LTD  4/11   normal  . PERIPHERAL VASCULAR CATHETERIZATION Right 06/27/2015   Procedure: Lower Extremity Angiography;  Surgeon: Algernon [name], MD;  Location: [facility];  Service: Cardiovascular;  Laterality: Right;  . PERIPHERAL VASCULAR CATHETERIZATION  06/27/2015   Procedure: Lower Extremity Intervention;  Surgeon: Algernon [name], MD;  Location: [facility];  Service: Cardiovascular;;    There were no vitals filed for this visit.  Subjective Assessment - 07/18/18 0849    Subjective  He has not had any falls. Got his chair lift replaced. States that it does block the  rail but it can still be reached. Uses cane a little inside the household and continues to use RW outside the house. Has used his rollator a little as well.     Patient is accompained by:  Family member    Pertinent History  L TTA, CAD, A-Fib, CKD3, DM2, PAD, HTN, HLD    Patient Stated Goals  He wants to use prosthesis so can walk & no longer needs his kids to transport & assist.     Currently in Pain?  Yes    Pain Score  3     Pain Location  Back    Pain Orientation  Lower    Pain Descriptors / Indicators  Aching    Pain Type  Chronic pain    Pain Onset  More than a month ago    Pain Frequency  Intermittent                       [facility] Adult PT Treatment/Exercise - 07/18/18 0854      Ambulation/Gait   Ambulation/Gait  Yes    Ambulation/Gait Assistance  5: Supervision    Ambulation/Gait Assistance Details  Vebal cues for safe use of straight cane and for increased weight shift to left on prosthesis.     Ambulation Distance (Feet)  230 Feet    Assistive device  Straight cane;Prosthesis    Gait Pattern  Step-through pattern;Decreased stance time - right;Decreased weight shift to left;Trunk flexed;Left flexed knee in stance    Ambulation Surface  Level;Indoor    Ramp  4: Min assist    Ramp Details (indicate cue type and reason)  Verbal cues for proper sequencing with straight cane.    Curb  4: Min assist;Other (comment)   Hand held Assist on decsencing   Curb Details (indicate cue type and reason)  Required HHA descending curb and did not feel confident that his RLE would lift him to ascend curb this morning.       Exercises   Exercises  Knee/Hip    Other Exercises   Alternating steps ups to 4in and 6in platform with BUE support progressing to UUE to simulate use of cane. LAQ x20 each with 6lb ankle weight; hip flexion and abduction with counter support x5 each with verbal cues to keep toes pointed forward with abduction and to avoid trunk lean.       Prosthetics    Prosthetic Care Comments   Pt comes in with no issues from last session.     Current prosthetic wear tolerance (days/week)   daily    Current prosthetic wear tolerance (#hours/day)   wearing all awake hours    Residual limb condition   intact per pt report     Education Provided  Skin check;Proper wear schedule/adjustment    Person(s) Educated  Patient    Education Method  Explanation    Education Method  Verbalized understanding             PT Education - 07/18/18 [phone]    Education Details  Importance of BLE strengthing to help with functional mobility especially negotiating curbs. Recommened to continue doing his regular arm exercises at home with counter support.     Person(s) Educated  Patient;Child(ren)    Methods  Explanation;Verbal cues;Demonstration    Comprehension  Verbalized understanding;Returned demonstration       PT Short Term Goals - 07/11/18 0630      PT SHORT TERM GOAL #1   Title  Patient verbalizes adjusting ply socks with limb volume changes. (All STGs updated Target Date: 08/05/2018)    Time  1    Period  Months    Status  New    Target Date  08/05/18      PT SHORT TERM GOAL #2   Title  Berg Balance >36/56    Time  1    Period  Months    Status  New    Target Date  08/05/18      PT SHORT TERM GOAL #3   Title  Patient tolerates wear >12hrs total/day.     Time  1    Period  Months    Status  Revised    Target Date  08/05/18      PT SHORT TERM GOAL #4   Title  Patient ambulates 200' with cane & prosthesis with minA.     Time  1    Period  Months    Status  New    Target Date  08/05/18      PT SHORT TERM GOAL #5   Title  Patient negotiates ramps & curbs with cane with minA.     Time  1    Period  Months    Status  New    Target Date  08/05/18        PT Long Term Goals - 07/11/18 5053      PT LONG TERM GOAL #1   Title  Pt will be independent with HEP along with prosthetic care in order to indicate improved functional mobility and  safety with prosthesis.  (Target Date: 09/05/17)    Time  12    Period  Weeks    Status  On-going    Target Date  09/05/17      PT LONG TERM GOAL #2   Title  Pt will tolerate wearing prosthesis >/=90% of awake hours without negative skin complications.     Time  12    Period  Weeks    Status  On-going    Target Date  09/05/17      PT LONG TERM GOAL #3   Title  Pt will ambulate w/ LRAD >500' over indoor and unlevel paved outdoor surfaces at mod I level in order to indicate safe home and limited community negotiation.     Time  12    Period  Weeks    Status  On-going    Target Date  09/05/17      PT LONG TERM GOAL #4   Title  Pt will improve BERG balance score to >/=45/56 in order to indicate decreased fall risk.     Time  12    Period  Weeks    Status  Revised  PT LONG TERM GOAL #5   Title  Patient negotiates ramps, curbs & stairs with LRAD & prosthesis modified Independent.     Time  12    Period  Weeks    Status  New    Target Date  09/05/17            Plan - 07/18/18 0943    Clinical Impression Statement  Today's treatment focused on gait training with straight cane as well as negotiating ramps and curbs. Pt did not feel confident in ascending curb with RLE this morning. Remainder of session focused on BLE strengthening with pt able to tolerate without complications, however pt fatigues easily with step ups. Pt would benefit from further PT to improve functioal independence with prosthesis.     Clinical Impairments Affecting Rehab Potential  Pt motivated to progress and become as independent as possible, limited by number of co-morbidities    PT Frequency  2x / week    PT Duration  12 weeks    PT Treatment/Interventions  ADLs/Self Care Home Management;DME Instruction;Gait training;Stair training;Functional mobility training;Therapeutic activities;Therapeutic exercise;Balance training;Neuromuscular re-education;Prosthetic Training;Passive range of motion;Energy  conservation;Vestibular    PT Next Visit Plan  Add hip/knee strengthening to HEP. Continue gait training with straight cane, balance training, and BLE strengthening.    Consulted and Agree with Plan of Care  Patient;Family member/caregiver       Patient will benefit from skilled therapeutic intervention in order to improve the following deficits and impairments:     Visit Diagnosis: Other abnormalities of gait and mobility  Unsteadiness on feet  Muscle weakness (generalized)  Abnormal posture     Problem List Patient Active Problem List   Diagnosis Date Noted  . Hx of BKA, left ([place]) 01/13/2018  . Nausea and vomiting in adult 12/26/2017  . Pressure injury of skin 12/23/2017  . C. difficile colitis 12/22/2017  . UTI (urinary tract infection) 12/22/2017  . Hyponatremia 12/22/2017  . Normocytic anemia 12/22/2017  . Chronic anticoagulation 09/26/2015  . Persistent atrial fibrillation 09/26/2015  . PVD (peripheral vascular disease) ([place]) 06/02/2012  . BPH (benign prostatic hyperplasia) 11/16/2011  . AKI (acute kidney injury) ([place]) 08/26/2011  . Coronary artery disease 03/25/2011  . OBSTRUCTIVE SLEEP APNEA 09/15/2010  . PERIODIC LIMB MOVEMENT DISORDER 09/15/2010  . PERSONAL HISTORY OF COLONIC POLYPS 10/25/2009  . VITAMIN B12 DEFICIENCY 03/04/2009  . HYPERTRIGLYCERIDEMIA 11/24/2006  . Essential hypertension 11/24/2006    Cecile Sheerer, SPTA 07/18/2018, 1:28 PM  [facility] [address] [place], [place], [address] Phone: [phone]   Fax:  [phone]  Name: Eric [name]. MRN: [id number] Date of Birth: October 15, 1927

## 2018-07-20 ENCOUNTER — Encounter: Payer: Self-pay | Admitting: Physical Therapy

## 2018-07-20 ENCOUNTER — Ambulatory Visit: Payer: PPO | Admitting: Physical Therapy

## 2018-07-20 DIAGNOSIS — R2689 Other abnormalities of gait and mobility: Secondary | ICD-10-CM

## 2018-07-20 DIAGNOSIS — M6281 Muscle weakness (generalized): Secondary | ICD-10-CM

## 2018-07-20 DIAGNOSIS — R2681 Unsteadiness on feet: Secondary | ICD-10-CM

## 2018-07-20 DIAGNOSIS — R293 Abnormal posture: Secondary | ICD-10-CM

## 2018-07-20 NOTE — Therapy (Signed)
[facility] [address] [place], [place], [address] Phone: [phone]   Fax:  [phone]  Physical Therapy Treatment  Patient Details  Name: Eric [name]. MRN: [id number] Date of Birth: 1928/01/08 Referring Provider (PT): Leotis Pain, MD   Encounter Date: 07/20/2018  PT End of Session - 07/20/18 1016    Visit Number  14    Number of Visits  25    Date for PT Re-Evaluation  09/05/18    Authorization Type  [employer]-Needs 10th visit PN    PT Start Time  0934    PT Stop Time  1015    PT Time Calculation (min)  41 min    Equipment Utilized During Treatment  Gait belt    Activity Tolerance  Patient tolerated treatment well    Behavior During Therapy  WFL for tasks assessed/performed       Past Medical History:  Diagnosis Date  . Atrial flutter ([place])    s/p RFCA  . CAD (coronary artery disease)    cath 2003, occluded S-RCA, occluded S-Dx, L-LAD ok, s/p PTCA to LAD  . Cataract   . CKD (chronic kidney disease) stage 3, GFR 30-59 ml/min (HCC)   . Diabetes mellitus    diet controlled  . Hearing aid worn    bilateral  . History of kidney stones   . Long term (current) use of anticoagulants   . Neuromuscular disorder ([facility])   . OSA (obstructive sleep apnea) 12/11   very mild, AHI 7/hr  . Persistent atrial fibrillation 09/26/2015  . Pure hyperglyceridemia   . PVD (peripheral vascular disease) ([place])    angioplasty of his right lower extremity in [place] by Dr.[name] 2013  . Unspecified essential hypertension   . Wears dentures    full upper and lower    Past Surgical History:  Procedure Laterality Date  . ABDOMINAL AORTOGRAM W/LOWER EXTREMITY N/A 11/15/2017   Procedure: ABDOMINAL AORTOGRAM W/LOWER EXTREMITY;  Surgeon: [name], MD;  Location: [facility];  Service: Cardiovascular;  Laterality: N/A;  . Adenosine Myoview  3/06   EF 56%, neg. Ischemia  . Adenosine Myoview  02/18/07   nml  . AMPUTATION Left 12/15/2017   Procedure: AMPUTATION BELOW KNEE;  Surgeon: Algernon [name], MD;  Location: [facility];  Service: Vascular;  Laterality: Left;  . ANGIOPLASTY  1/99   CAD- diogonal with rotational artherectomy  . Arthrectomy     of LAD & PTCA  . BLEPHAROPLASTY Bilateral   . CARDIAC CATHETERIZATION  1/00  . CARDIOVERSION  1/04  . CARDIOVERSION  5/07   hospital- a flutter  . CARPAL TUNNEL RELEASE     ? bilateral  . CATARACT EXTRACTION W/PHACO Right 07/28/2017   Procedure: CATARACT EXTRACTION PHACO AND INTRAOCULAR LENS PLACEMENT ([facility]) RIGHT DIABETIC;  Surgeon: [name], MD;  Location: [facility];  Service: Ophthalmology;  Laterality: Right;  Diabetic - diet controlled  . CATARACT EXTRACTION W/PHACO Left 08/18/2017   Procedure: CATARACT EXTRACTION PHACO AND INTRAOCULAR LENS PLACEMENT ([facility]);  Surgeon: [name], MD;  Location: [facility];  Service: Ophthalmology;  Laterality: Left;  DIABETES - oral meds  . COLONOSCOPY W/ BIOPSIES  10/01/06   sigmoid polyp bx neg, 3 years  . CORONARY ANGIOPLASTY  4/03   cutting balloon PTCA pLAD into Diag  . CORONARY ARTERY BYPASS GRAFT  2000   LIMA-LAD, SVG-RCA, SVG-Diag; SVG-Diag & SVG-RCA occluded 2003  . FRACTURE SURGERY    . HAND  SURGERY  08/27/09   R thumb procedure wit Scaphoid Gragt and screws, Dr [name]  . NM MYOVIEW LTD  4/11   normal  . PERIPHERAL VASCULAR CATHETERIZATION Right 06/27/2015   Procedure: Lower Extremity Angiography;  Surgeon: Algernon [name], MD;  Location: [facility];  Service: Cardiovascular;  Laterality: Right;  . PERIPHERAL VASCULAR CATHETERIZATION  06/27/2015   Procedure: Lower Extremity Intervention;  Surgeon: Algernon [name], MD;  Location: [facility];  Service: Cardiovascular;;    There were no vitals filed for this visit.  Subjective Assessment - 07/20/18 0937    Subjective   No falls.    Patient is accompained by:  Family member    Pertinent History  L  TTA, CAD, A-Fib, CKD3, DM2, PAD, HTN, HLD    Patient Stated Goals  He wants to use prosthesis so can walk & no longer needs his kids to transport & assist.     Currently in Pain?  No/denies    Pain Onset  More than a month ago                       [facility] Adult PT Treatment/Exercise - 07/20/18 0001      Transfers   Five time sit to stand comments   working on strength and balance with only 1 UE support sit>stand and no UE support stand>sit 5x2; min guard.      Ambulation/Gait   Ambulation/Gait  Yes    Ambulation/Gait Assistance  5: Supervision    Ambulation/Gait Assistance Details  Working on increasing activity tolerance, balance with obstacle negotiation- over, around, and side stepping small spaces. Cues for upright gaze and visual scanning.                             Ambulation Distance (Feet)  230 Feet    Assistive device  Straight cane;Prosthesis    Gait Pattern  Step-through pattern;Decreased stance time - right;Decreased weight shift to left;Trunk flexed;Left flexed knee in stance    Ambulation Surface  Level;Indoor    Ramp  4: Min assist    Ramp Details (indicate cue type and reason)  cues for weight shifting and sequence with cane      Exercises   Exercises  Knee/Hip      Knee/Hip Exercises: Standing   Forward Step Up  5 reps;Hand Hold: 1;Step Height: 4";2 sets;Right;Left      Prosthetics   Prosthetic Care Comments   Reviewed ply sock adjustment    Current prosthetic wear tolerance (days/week)   daily    Current prosthetic wear tolerance (#hours/day)   wearing all awake hours    Residual limb condition   intact per pt report     Education Provided  Skin check;Proper wear schedule/adjustment    Person(s) Educated  Patient;Spouse;Child(ren)    Education Method  Explanation    Education Method  Verbalized understanding               PT Short Term Goals - 07/11/18 0630      PT SHORT TERM GOAL #1   Title  Patient verbalizes adjusting ply socks  with limb volume changes. (All STGs updated Target Date: 08/05/2018)    Time  1    Period  Months    Status  New    Target Date  08/05/18      PT SHORT TERM GOAL #2   Title  Merrilee Jansky  Balance >36/56    Time  1    Period  Months    Status  New    Target Date  08/05/18      PT SHORT TERM GOAL #3   Title  Patient tolerates wear >12hrs total/day.     Time  1    Period  Months    Status  Revised    Target Date  08/05/18      PT SHORT TERM GOAL #4   Title  Patient ambulates 200' with cane & prosthesis with minA.     Time  1    Period  Months    Status  New    Target Date  08/05/18      PT SHORT TERM GOAL #5   Title  Patient negotiates ramps & curbs with cane with minA.     Time  1    Period  Months    Status  New    Target Date  08/05/18        PT Long Term Goals - 07/11/18 3762      PT LONG TERM GOAL #1   Title  Pt will be independent with HEP along with prosthetic care in order to indicate improved functional mobility and safety with prosthesis.  (Target Date: 09/05/17)    Time  12    Period  Weeks    Status  On-going    Target Date  09/05/17      PT LONG TERM GOAL #2   Title  Pt will tolerate wearing prosthesis >/=90% of awake hours without negative skin complications.     Time  12    Period  Weeks    Status  On-going    Target Date  09/05/17      PT LONG TERM GOAL #3   Title  Pt will ambulate w/ LRAD >500' over indoor and unlevel paved outdoor surfaces at mod I level in order to indicate safe home and limited community negotiation.     Time  12    Period  Weeks    Status  On-going    Target Date  09/05/17      PT LONG TERM GOAL #4   Title  Pt will improve BERG balance score to >/=45/56 in order to indicate decreased fall risk.     Time  12    Period  Weeks    Status  Revised      PT LONG TERM GOAL #5   Title  Patient negotiates ramps, curbs & stairs with LRAD & prosthesis modified Independent.     Time  12    Period  Weeks    Status  New    Target Date   09/05/17            Plan - 07/20/18 1016    Clinical Impression Statement  Skilled session focused on dynamic gait training with SPC and LE strengthening.  Pt requires close supervision to min guard during gait with household type obstacles and min A with community obstacles when using a SPC.  Pt tolerated LE strengthening well and is challenged with sit<>stands and step ups with only 1 UE support.  Clinical Impairments Affecting Rehab Potential  Pt motivated to progress and become as independent as possible, limited by number of co-morbidities    PT Frequency  2x / week    PT Duration  12 weeks    PT Treatment/Interventions  ADLs/Self Care Home Management;DME Instruction;Gait training;Stair training;Functional mobility training;Therapeutic activities;Therapeutic exercise;Balance training;Neuromuscular re-education;Prosthetic Training;Passive range of motion;Energy conservation;Vestibular    PT Next Visit Plan  Add hip/knee strengthening to HEP. Continue gait training with straight cane, balance training, and BLE strengthening.    Consulted and Agree with Plan of Care  Patient;Family member/caregiver       Patient will benefit from skilled therapeutic intervention in order to improve the following deficits and impairments:  Abnormal gait, Decreased activity tolerance, Decreased balance, Decreased endurance, Decreased knowledge of precautions, Decreased knowledge of use of DME, Decreased mobility, Decreased strength, Difficulty walking, Impaired perceived functional ability, Impaired flexibility, Impaired sensation, Prosthetic Dependency, Postural dysfunction  Visit Diagnosis: Other abnormalities of gait and mobility  Unsteadiness on feet  Muscle weakness (generalized)  Abnormal posture     Problem List Patient Active Problem List   Diagnosis Date Noted  . Hx of BKA, left ([place])  01/13/2018  . Nausea and vomiting in adult 12/26/2017  . Pressure injury of skin 12/23/2017  . C. difficile colitis 12/22/2017  . UTI (urinary tract infection) 12/22/2017  . Hyponatremia 12/22/2017  . Normocytic anemia 12/22/2017  . Chronic anticoagulation 09/26/2015  . Persistent atrial fibrillation 09/26/2015  . PVD (peripheral vascular disease) ([place]) 06/02/2012  . BPH (benign prostatic hyperplasia) 11/16/2011  . AKI (acute kidney injury) ([facility]) 08/26/2011  . Coronary artery disease 03/25/2011  . OBSTRUCTIVE SLEEP APNEA 09/15/2010  . PERIODIC LIMB MOVEMENT DISORDER 09/15/2010  . PERSONAL HISTORY OF COLONIC POLYPS 10/25/2009  . VITAMIN B12 DEFICIENCY 03/04/2009  . HYPERTRIGLYCERIDEMIA 11/24/2006  . Essential hypertension 11/24/2006    Bjorn Loser, PTA  07/20/18, 1:22 PM [facility] [address] [place], [place], [address] Phone: [phone]   Fax:  [phone]  Name: Eric [name]. MRN: [id number] Date of Birth: 11-07-1927

## 2018-07-21 ENCOUNTER — Ambulatory Visit: Payer: PPO | Admitting: Physical Therapy

## 2018-07-24 ENCOUNTER — Encounter: Payer: Self-pay | Admitting: Family Medicine

## 2018-07-25 ENCOUNTER — Encounter: Payer: Self-pay | Admitting: Family Medicine

## 2018-07-25 ENCOUNTER — Ambulatory Visit: Payer: PPO | Admitting: Rehabilitation

## 2018-07-25 ENCOUNTER — Encounter: Payer: Self-pay | Admitting: Rehabilitation

## 2018-07-25 DIAGNOSIS — R293 Abnormal posture: Secondary | ICD-10-CM

## 2018-07-25 DIAGNOSIS — M6281 Muscle weakness (generalized): Secondary | ICD-10-CM

## 2018-07-25 DIAGNOSIS — R2681 Unsteadiness on feet: Secondary | ICD-10-CM

## 2018-07-25 DIAGNOSIS — R2689 Other abnormalities of gait and mobility: Secondary | ICD-10-CM

## 2018-07-25 NOTE — Patient Instructions (Signed)
Access Code: [email or web address]  URL: [email or web address]  Date: 07/25/2018  Prepared by: Cameron Sprang   Exercises  Standing Marching - 10 reps - 1 sets - 1-2x daily - 7x weekly  Standing Hip Abduction with Counter Support - 10 reps - 1 sets - 1-2x daily - 7x weekly  Walking - 4 reps - 1-2x daily - 7x weekly  Clamshell - 10 reps - 1 sets - 1-2x daily - 7x weekly

## 2018-07-25 NOTE — Therapy (Signed)
[facility] [address] [place], [place], [address] Phone: [phone]   Fax:  [phone]  Physical Therapy Treatment  Patient Details  Name: Eric [name]. MRN: [id number] Date of Birth: 1927/11/13 Referring Provider (PT): Leotis Pain, MD   Encounter Date: 07/25/2018  PT End of Session - 07/25/18 0939    Visit Number  15    Number of Visits  25    Date for PT Re-Evaluation  09/05/18    Authorization Type  [employer]-Needs 10th visit PN    PT Start Time  0933    PT Stop Time  1016    PT Time Calculation (min)  43 min    Equipment Utilized During Treatment  Gait belt    Activity Tolerance  Patient tolerated treatment well    Behavior During Therapy  WFL for tasks assessed/performed       Past Medical History:  Diagnosis Date  . Atrial flutter ([place])    s/p RFCA  . CAD (coronary artery disease)    cath 2003, occluded S-RCA, occluded S-Dx, L-LAD ok, s/p PTCA to LAD  . Cataract   . CKD (chronic kidney disease) stage 3, GFR 30-59 ml/min (HCC)   . Diabetes mellitus    diet controlled  . Hearing aid worn    bilateral  . History of kidney stones   . Long term (current) use of anticoagulants   . Neuromuscular disorder ([place])   . OSA (obstructive sleep apnea) 12/11   very mild, AHI 7/hr  . Persistent atrial fibrillation 09/26/2015  . Pure hyperglyceridemia   . PVD (peripheral vascular disease) ([place])    angioplasty of his right lower extremity in [place] by Dr.[name] 2013  . Unspecified essential hypertension   . Wears dentures    full upper and lower    Past Surgical History:  Procedure Laterality Date  . ABDOMINAL AORTOGRAM W/LOWER EXTREMITY N/A 11/15/2017   Procedure: ABDOMINAL AORTOGRAM W/LOWER EXTREMITY;  Surgeon: [name], MD;  Location: [facility];  Service: Cardiovascular;  Laterality: N/A;  . Adenosine Myoview  3/06   EF 56%, neg. Ischemia  . Adenosine Myoview  02/18/07    nml  . AMPUTATION Left 12/15/2017   Procedure: AMPUTATION BELOW KNEE;  Surgeon: Algernon [name], MD;  Location: [facility];  Service: Vascular;  Laterality: Left;  . ANGIOPLASTY  1/99   CAD- diogonal with rotational artherectomy  . Arthrectomy     of LAD & PTCA  . BLEPHAROPLASTY Bilateral   . CARDIAC CATHETERIZATION  1/00  . CARDIOVERSION  1/04  . CARDIOVERSION  5/07   hospital- a flutter  . CARPAL TUNNEL RELEASE     ? bilateral  . CATARACT EXTRACTION W/PHACO Right 07/28/2017   Procedure: CATARACT EXTRACTION PHACO AND INTRAOCULAR LENS PLACEMENT ([place]) RIGHT DIABETIC;  Surgeon: [name], MD;  Location: [facility];  Service: Ophthalmology;  Laterality: Right;  Diabetic - diet controlled  . CATARACT EXTRACTION W/PHACO Left 08/18/2017   Procedure: CATARACT EXTRACTION PHACO AND INTRAOCULAR LENS PLACEMENT ([facility]);  Surgeon: [name], MD;  Location: [facility];  Service: Ophthalmology;  Laterality: Left;  DIABETES - oral meds  . COLONOSCOPY W/ BIOPSIES  10/01/06   sigmoid polyp bx neg, 3 years  . CORONARY ANGIOPLASTY  4/03   cutting balloon PTCA pLAD into Diag  . CORONARY ARTERY BYPASS GRAFT  2000   LIMA-LAD, SVG-RCA, SVG-Diag; SVG-Diag & SVG-RCA occluded 2003  . FRACTURE SURGERY    .  HAND SURGERY  08/27/09   R thumb procedure wit Scaphoid Gragt and screws, Dr [name]  . NM MYOVIEW LTD  4/11   normal  . PERIPHERAL VASCULAR CATHETERIZATION Right 06/27/2015   Procedure: Lower Extremity Angiography;  Surgeon: Algernon [name], MD;  Location: [facility];  Service: Cardiovascular;  Laterality: Right;  . PERIPHERAL VASCULAR CATHETERIZATION  06/27/2015   Procedure: Lower Extremity Intervention;  Surgeon: Algernon [name], MD;  Location: [facility];  Service: Cardiovascular;;    There were no vitals filed for this visit.  Subjective Assessment - 07/25/18 0936    Subjective  Pt reports some pain in L patellar tendon region after walking all day.      Patient  is accompained by:  Family member    Pertinent History  L TTA, CAD, A-Fib, CKD3, DM2, PAD, HTN, HLD    Patient Stated Goals  He wants to use prosthesis so can walk & no longer needs his kids to transport & assist.     Currently in Pain?  No/denies                       [facility] Adult PT Treatment/Exercise - 07/25/18 0940      Ambulation/Gait   Ambulation/Gait  Yes    Ambulation/Gait Assistance  5: Supervision;4: Min guard    Ambulation/Gait Assistance Details  Continue to address gait with SPC and L prosthesis.  Pt doing very well with prosthesis and is mostly S level with intermittent min/guard when making turns.  Note Trendelenburg type gait pattern due to L hip weakness.  Therefore addressed this during session and added standing hip strength to HEP.      Ambulation Distance (Feet)  230 Feet    Assistive device  Straight cane;Prosthesis    Gait Pattern  Step-through pattern;Decreased stance time - right;Decreased weight shift to left;Trunk flexed;Left flexed knee in stance    Ambulation Surface  Level;Indoor    Stairs  Yes    Stairs Assistance  5: Supervision    Stairs Assistance Details (indicate cue type and reason)  Min cues for leading with LLE and descending with RLE due to LLE being stronger.     Stair Management Technique  One rail Left;With cane;Step to pattern;Forwards    Number of Stairs  8    Height of Stairs  6    Gait Comments  Addressed gait without AD in // bars.  Pt does well, just continue to note hip drop therefore provided cues for this during session.  Educated pt to perform at counter top at home if trying to walk without AD for safety.  Did this during session x 4 laps.        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  Demonstrated correct way to perform getting down to knees  and back up to standing/sitting.  Encouraged pt to perform both ways however he was able to do better when leading up with LLE.  Recommend he practice with assist if doing at home from the RLE.  Pt verbalized understanding.       Exercises   Exercises  Knee/Hip      Knee/Hip Exercises: Standing   Hip Flexion  Stengthening;Both;1 set;20 reps   marching at counter top    Hip  Abduction  Stengthening;Both;1 set;10 reps    Abduction Limitations  With emphasis on L hip stability when moving R LE into abduction.              PT Education - 07/25/18 1139    Education Details  safety with getting down/up from floor, walking without AD only at counter top at home.     Person(s) Educated  Patient;Spouse    Methods  Explanation;Demonstration    Comprehension  Verbalized understanding;Returned demonstration       PT Short Term Goals - 07/11/18 0630      PT SHORT TERM GOAL #1   Title  Patient verbalizes adjusting ply socks with limb volume changes. (All STGs updated Target Date: 08/05/2018)    Time  1    Period  Months    Status  New    Target Date  08/05/18      PT SHORT TERM GOAL #2   Title  Berg Balance >36/56    Time  1    Period  Months    Status  New    Target Date  08/05/18      PT SHORT TERM GOAL #3   Title  Patient tolerates wear >12hrs total/day.     Time  1    Period  Months    Status  Revised    Target Date  08/05/18      PT SHORT TERM GOAL #4   Title  Patient ambulates 200' with cane & prosthesis with minA.     Time  1    Period  Months    Status  New    Target Date  08/05/18      PT SHORT TERM GOAL #5   Title  Patient negotiates ramps & curbs with cane with minA.     Time  1    Period  Months    Status  New    Target Date  08/05/18        PT Long Term Goals - 07/11/18 1017      PT LONG TERM GOAL #1   Title  Pt will be independent with HEP along with prosthetic care in order to indicate improved functional mobility and safety with prosthesis.   (Target Date: 09/05/17)    Time  12    Period  Weeks    Status  On-going    Target Date  09/05/17      PT LONG TERM GOAL #2   Title  Pt will tolerate wearing prosthesis >/=90% of awake hours without negative skin complications.     Time  12    Period  Weeks    Status  On-going    Target Date  09/05/17      PT LONG TERM GOAL #3   Title  Pt will ambulate w/ LRAD >500' over indoor and unlevel paved outdoor surfaces at mod I level in order to indicate safe home and limited community negotiation.     Time  12    Period  Weeks    Status  On-going    Target Date  09/05/17      PT LONG TERM GOAL #4   Title  Pt will improve BERG balance score to >/=45/56 in order to indicate decreased fall risk.     Time  12    Period  Weeks    Status  Revised      PT LONG TERM GOAL #5   Title  Patient negotiates ramps,  curbs & stairs with LRAD & prosthesis modified Independent.     Time  12    Period  Weeks    Status  New    Target Date  09/05/17            Plan - 07/25/18 0939    Clinical Impression Statement  Skilled session focused on gait safety and quality with SPC and prosthesis, B hip strengthening and safety with floor recovery.  Pt progressing well with SPC, however feels that he is not progressing as quickly as he would like.  Also briefly discussed using rollator to get to work shop.  He reports he has done this at home safely.     Clinical Impairments Affecting Rehab Potential  Pt motivated to progress and become as independent as possible, limited by number of co-morbidities    PT Frequency  2x / week    PT Duration  12 weeks    PT Treatment/Interventions  ADLs/Self Care Home Management;DME Instruction;Gait training;Stair training;Functional mobility training;Therapeutic activities;Therapeutic exercise;Balance training;Neuromuscular re-education;Prosthetic Training;Passive range of motion;Energy conservation;Vestibular    PT Next Visit Plan   Continue gait training with straight  cane, balance training, and BLE strengthening esp hip strength.    Consulted and Agree with Plan of Care  Patient;Family member/caregiver       Patient will benefit from skilled therapeutic intervention in order to improve the following deficits and impairments:  Abnormal gait, Decreased activity tolerance, Decreased balance, Decreased endurance, Decreased knowledge of precautions, Decreased knowledge of use of DME, Decreased mobility, Decreased strength, Difficulty walking, Impaired perceived functional ability, Impaired flexibility, Impaired sensation, Prosthetic Dependency, Postural dysfunction  Visit Diagnosis: Other abnormalities of gait and mobility  Unsteadiness on feet  Muscle weakness (generalized)  Abnormal posture     Problem List Patient Active Problem List   Diagnosis Date Noted  . Hx of BKA, left ([place]) 01/13/2018  . Nausea and vomiting in adult 12/26/2017  . Pressure injury of skin 12/23/2017  . C. difficile colitis 12/22/2017  . UTI (urinary tract infection) 12/22/2017  . Hyponatremia 12/22/2017  . Normocytic anemia 12/22/2017  . Chronic anticoagulation 09/26/2015  . Persistent atrial fibrillation 09/26/2015  . PVD (peripheral vascular disease) ([place]) 06/02/2012  . BPH (benign prostatic hyperplasia) 11/16/2011  . AKI (acute kidney injury) ([place]) 08/26/2011  . Coronary artery disease 03/25/2011  . OBSTRUCTIVE SLEEP APNEA 09/15/2010  . PERIODIC LIMB MOVEMENT DISORDER 09/15/2010  . PERSONAL HISTORY OF COLONIC POLYPS 10/25/2009  . VITAMIN B12 DEFICIENCY 03/04/2009  . HYPERTRIGLYCERIDEMIA 11/24/2006  . Essential hypertension 11/24/2006    Cameron Sprang, PT, MPT [facility] [address] [place], [place], [address] Phone: [phone]   Fax:  [phone] 07/25/18, 11:43 AM  Name: Eric [name]. MRN: [id number] Date of Birth: 08/19/1927

## 2018-07-26 DIAGNOSIS — S88112D Complete traumatic amputation at level between knee and ankle, left lower leg, subsequent encounter: Secondary | ICD-10-CM | POA: Diagnosis not present

## 2018-07-28 ENCOUNTER — Ambulatory Visit: Payer: PPO | Admitting: Rehabilitation

## 2018-07-28 DIAGNOSIS — S88112D Complete traumatic amputation at level between knee and ankle, left lower leg, subsequent encounter: Secondary | ICD-10-CM | POA: Diagnosis not present

## 2018-07-28 DIAGNOSIS — E11621 Type 2 diabetes mellitus with foot ulcer: Secondary | ICD-10-CM | POA: Diagnosis not present

## 2018-08-01 ENCOUNTER — Encounter: Payer: Self-pay | Admitting: Physical Therapy

## 2018-08-01 ENCOUNTER — Ambulatory Visit: Payer: PPO | Admitting: Physical Therapy

## 2018-08-01 DIAGNOSIS — R2681 Unsteadiness on feet: Secondary | ICD-10-CM

## 2018-08-01 DIAGNOSIS — R2689 Other abnormalities of gait and mobility: Secondary | ICD-10-CM

## 2018-08-01 DIAGNOSIS — R293 Abnormal posture: Secondary | ICD-10-CM

## 2018-08-01 DIAGNOSIS — M6281 Muscle weakness (generalized): Secondary | ICD-10-CM

## 2018-08-01 NOTE — Therapy (Signed)
[facility] [address] [place], [place], [address] Phone: [phone]   Fax:  [phone]  Physical Therapy Treatment  Patient Details  Name: Eric [name]. MRN: [id number] Date of Birth: February 15, 1928 Referring Provider (PT): Leotis Pain, MD   Encounter Date: 08/01/2018  PT End of Session - 08/01/18 0932    Visit Number  16    Number of Visits  25    Date for PT Re-Evaluation  09/05/18    Authorization Type  [employer]-Needs 10th visit PN    PT Start Time  0845    PT Stop Time  0930    PT Time Calculation (min)  45 min    Equipment Utilized During Treatment  Gait belt    Activity Tolerance  Patient tolerated treatment well    Behavior During Therapy  WFL for tasks assessed/performed       Past Medical History:  Diagnosis Date  . Atrial flutter ([place])    s/p RFCA  . CAD (coronary artery disease)    cath 2003, occluded S-RCA, occluded S-Dx, L-LAD ok, s/p PTCA to LAD  . Cataract   . CKD (chronic kidney disease) stage 3, GFR 30-59 ml/min (HCC)   . Diabetes mellitus    diet controlled  . Hearing aid worn    bilateral  . History of kidney stones   . Long term (current) use of anticoagulants   . Neuromuscular disorder ([place])   . OSA (obstructive sleep apnea) 12/11   very mild, AHI 7/hr  . Persistent atrial fibrillation 09/26/2015  . Pure hyperglyceridemia   . PVD (peripheral vascular disease) ([place])    angioplasty of his right lower extremity in [place] by Dr.[name] 2013  . Unspecified essential hypertension   . Wears dentures    full upper and lower    Past Surgical History:  Procedure Laterality Date  . ABDOMINAL AORTOGRAM W/LOWER EXTREMITY N/A 11/15/2017   Procedure: ABDOMINAL AORTOGRAM W/LOWER EXTREMITY;  Surgeon: [name], MD;  Location: [facility];  Service: Cardiovascular;  Laterality: N/A;  . Adenosine Myoview  3/06   EF 56%, neg. Ischemia  . Adenosine Myoview  02/18/07   nml  . AMPUTATION Left 12/15/2017   Procedure: AMPUTATION BELOW KNEE;  Surgeon: Algernon [name], MD;  Location: [facility];  Service: Vascular;  Laterality: Left;  . ANGIOPLASTY  1/99   CAD- diogonal with rotational artherectomy  . Arthrectomy     of LAD & PTCA  . BLEPHAROPLASTY Bilateral   . CARDIAC CATHETERIZATION  1/00  . CARDIOVERSION  1/04  . CARDIOVERSION  5/07   hospital- a flutter  . CARPAL TUNNEL RELEASE     ? bilateral  . CATARACT EXTRACTION W/PHACO Right 07/28/2017   Procedure: CATARACT EXTRACTION PHACO AND INTRAOCULAR LENS PLACEMENT ([place]) RIGHT DIABETIC;  Surgeon: [name], MD;  Location: [facility];  Service: Ophthalmology;  Laterality: Right;  Diabetic - diet controlled  . CATARACT EXTRACTION W/PHACO Left 08/18/2017   Procedure: CATARACT EXTRACTION PHACO AND INTRAOCULAR LENS PLACEMENT ([facility]);  Surgeon: [name], MD;  Location: [facility];  Service: Ophthalmology;  Laterality: Left;  DIABETES - oral meds  . COLONOSCOPY W/ BIOPSIES  10/01/06   sigmoid polyp bx neg, 3 years  . CORONARY ANGIOPLASTY  4/03   cutting balloon PTCA pLAD into Diag  . CORONARY ARTERY BYPASS GRAFT  2000   LIMA-LAD, SVG-RCA, SVG-Diag; SVG-Diag & SVG-RCA occluded 2003  . FRACTURE SURGERY    . HAND  SURGERY  08/27/09   R thumb procedure wit Scaphoid Gragt and screws, Dr [name]  . NM MYOVIEW LTD  4/11   normal  . PERIPHERAL VASCULAR CATHETERIZATION Right 06/27/2015   Procedure: Lower Extremity Angiography;  Surgeon: Algernon [name], MD;  Location: [facility];  Service: Cardiovascular;  Laterality: Right;  . PERIPHERAL VASCULAR CATHETERIZATION  06/27/2015   Procedure: Lower Extremity Intervention;  Surgeon: Algernon [name], MD;  Location: [facility];  Service: Cardiovascular;;    There were no vitals filed for this visit.  Subjective Assessment - 08/01/18 0845    Subjective  No falls. He is a little shaky this morning as did not eat breakfast.     Patient is  accompained by:  Family member    Pertinent History  L TTA, CAD, A-Fib, CKD3, DM2, PAD, HTN, HLD    Patient Stated Goals  He wants to use prosthesis so can walk & no longer needs his kids to transport & assist.     Currently in Pain?  No/denies         [facility] PT Assessment - 08/01/18 0845      Berg Balance Test   Sit to Stand  Able to stand  independently using hands    Standing Unsupported  Able to stand safely 2 minutes    Sitting with Back Unsupported but Feet Supported on Floor or Stool  Able to sit safely and securely 2 minutes    Stand to Sit  Sits safely with minimal use of hands    Transfers  Able to transfer safely, minor use of hands    Standing Unsupported with Eyes Closed  Able to stand 10 seconds with supervision    Standing Ubsupported with Feet Together  Able to place feet together independently and stand for 1 minute with supervision    From Standing, Reach Forward with Outstretched Arm  Can reach forward >5 cm safely (2")    From Standing Position, Pick up Object from Floor  Able to pick up shoe, needs supervision    From Standing Position, Turn to Look Behind Over each Shoulder  Turn sideways only but maintains balance    Turn 360 Degrees  Needs close supervision or verbal cueing    Standing Unsupported, Alternately Place Feet on Step/Stool  Needs assistance to keep from falling or unable to try    Standing Unsupported, One Foot in Front  Able to take small step independently and hold 30 seconds    Standing on One Leg  Tries to lift leg/unable to hold 3 seconds but remains standing independently    Total Score  36    Berg comment:  Initial Merrilee Jansky 16/56                   [facility] Adult PT Treatment/Exercise - 08/01/18 0001      Ambulation/Gait   Ambulation/Gait  Yes    Ambulation/Gait Assistance  5: Supervision    Ambulation Distance (Feet)  250 Feet    Assistive device  Straight cane;Prosthesis    Gait Pattern  Step-through pattern;Decreased stance time -  right;Decreased weight shift to left;Trunk flexed;Left flexed knee in stance    Ambulation Surface  Indoor;Level    Ramp  4: Min assist;5: Supervision   cane & prosthesis   Ramp Details (indicate cue type and reason)  cues on posture & wt shift    Curb  4: Min assist   cane & prosthesis   Curb Details (  indicate cue type and reason)  cues on sequence & wt shift      High Level Balance   High Level Balance Activities  Negotiating over obstacles    High Level Balance Comments  PT demo & instructed in technique to step over obstacles with TTA prosthesis. Pt return demo with cane with min to modA.       Prosthetics   Prosthetic Care Comments   fall risk when not wearing prosthesis; recommendation to wear prosthesis even when ill.     Current prosthetic wear tolerance (days/week)   daily    Current prosthetic wear tolerance (#hours/day)   wearing all awake hours    Education Provided  Other (comment)   see prosthetic care comments   Person(s) Educated  Patient;Spouse    Education Method  Explanation;Verbal cues    Education Method  Verbalized understanding;Verbal cues required;Needs further instruction               PT Short Term Goals - 08/01/18 1026      PT SHORT TERM GOAL #1   Title  Patient verbalizes adjusting ply socks with limb volume changes. (All STGs updated Target Date: 08/05/2018)    Baseline  MET 08/01/2018    Time  1    Period  Months    Status  Achieved      PT SHORT TERM GOAL #2   Title  Berg Balance >36/56    Baseline  MET 08/01/2018  Merrilee Jansky 36/56    Time  1    Period  Months    Status  Achieved      PT SHORT TERM GOAL #3   Title  Patient tolerates wear >12hrs total/day.     Baseline  MET 08/01/2018    Time  1    Period  Months    Status  Achieved      PT SHORT TERM GOAL #4   Title  Patient ambulates 200' with cane & prosthesis with minA.     Baseline  MET 08/01/2018    Time  1    Period  Months    Status  Achieved      PT SHORT TERM GOAL #5    Title  Patient negotiates ramps & curbs with cane with minA.     Baseline  MET 08/01/2018    Time  1    Period  Months    Status  Achieved        PT Long Term Goals - 07/11/18 7035      PT LONG TERM GOAL #1   Title  Pt will be independent with HEP along with prosthetic care in order to indicate improved functional mobility and safety with prosthesis.  (Target Date: 09/05/17)    Time  12    Period  Weeks    Status  On-going    Target Date  09/05/17      PT LONG TERM GOAL #2   Title  Pt will tolerate wearing prosthesis >/=90% of awake hours without negative skin complications.     Time  12    Period  Weeks    Status  On-going    Target Date  09/05/17      PT LONG TERM GOAL #3   Title  Pt will ambulate w/ LRAD >500' over indoor and unlevel paved outdoor surfaces at mod I level in order to indicate safe home and limited community negotiation.     Time  12    Period  Weeks    Status  On-going    Target Date  09/05/17      PT LONG TERM GOAL #4   Title  Pt will improve BERG balance score to >/=45/56 in order to indicate decreased fall risk.     Time  12    Period  Weeks    Status  Revised      PT LONG TERM GOAL #5   Title  Patient negotiates ramps, curbs & stairs with LRAD & prosthesis modified Independent.     Time  12    Period  Weeks    Status  New    Target Date  09/05/17            Plan - 08/01/18 1027    Clinical Impression Statement  Patient met all STGs set for this 30-day period. Patient improved Berg Balance from 16/56 to 36/56 indicating high fall risk still.     Clinical Impairments Affecting Rehab Potential  Pt motivated to progress and become as independent as possible, limited by number of co-morbidities    PT Frequency  2x / week    PT Duration  12 weeks    PT Treatment/Interventions  ADLs/Self Care Home Management;DME Instruction;Gait training;Stair training;Functional mobility training;Therapeutic activities;Therapeutic exercise;Balance  training;Neuromuscular re-education;Prosthetic Training;Passive range of motion;Energy conservation;Vestibular    PT Next Visit Plan  work towards [place],  Continue gait training with straight cane, balance training, and BLE strengthening esp hip strength.    Consulted and Agree with Plan of Care  Patient;Family member/caregiver    Family Member Consulted  wife       Patient will benefit from skilled therapeutic intervention in order to improve the following deficits and impairments:  Abnormal gait, Decreased activity tolerance, Decreased balance, Decreased endurance, Decreased knowledge of precautions, Decreased knowledge of use of DME, Decreased mobility, Decreased strength, Difficulty walking, Impaired perceived functional ability, Impaired flexibility, Impaired sensation, Prosthetic Dependency, Postural dysfunction  Visit Diagnosis: Other abnormalities of gait and mobility  Unsteadiness on feet  Muscle weakness (generalized)  Abnormal posture     Problem List Patient Active Problem List   Diagnosis Date Noted  . Hx of BKA, left ([place]) 01/13/2018  . Nausea and vomiting in adult 12/26/2017  . Pressure injury of skin 12/23/2017  . C. difficile colitis 12/22/2017  . UTI (urinary tract infection) 12/22/2017  . Hyponatremia 12/22/2017  . Normocytic anemia 12/22/2017  . Chronic anticoagulation 09/26/2015  . Persistent atrial fibrillation 09/26/2015  . PVD (peripheral vascular disease) ([place]) 06/02/2012  . BPH (benign prostatic hyperplasia) 11/16/2011  . AKI (acute kidney injury) ([place]) 08/26/2011  . Coronary artery disease 03/25/2011  . OBSTRUCTIVE SLEEP APNEA 09/15/2010  . PERIODIC LIMB MOVEMENT DISORDER 09/15/2010  . PERSONAL HISTORY OF COLONIC POLYPS 10/25/2009  . VITAMIN B12 DEFICIENCY 03/04/2009  . HYPERTRIGLYCERIDEMIA 11/24/2006  . Essential hypertension 11/24/2006    Jamey Reas PT, DPT 08/01/2018, 12:11 PM  [facility] [address] [place], [place], [address] Phone: [phone]   Fax:  [phone]  Name: Halton Neas. MRN: [id number] Date of Birth: [birth year]

## 2018-08-04 ENCOUNTER — Ambulatory Visit: Payer: PPO | Admitting: Physical Therapy

## 2018-08-04 ENCOUNTER — Encounter: Payer: Self-pay | Admitting: Physical Therapy

## 2018-08-04 DIAGNOSIS — R2689 Other abnormalities of gait and mobility: Secondary | ICD-10-CM

## 2018-08-04 DIAGNOSIS — M6281 Muscle weakness (generalized): Secondary | ICD-10-CM

## 2018-08-04 DIAGNOSIS — R2681 Unsteadiness on feet: Secondary | ICD-10-CM

## 2018-08-04 NOTE — Therapy (Signed)
[facility] [address] [place], [place], [address] Phone: [phone]   Fax:  [phone]  Physical Therapy Treatment  Patient Details  Name: Eric [name]. MRN: [id number] Date of Birth: Mar 16, 1928 Referring Provider (PT): Leotis Pain, MD   Encounter Date: 08/04/2018  PT End of Session - 08/04/18 0935    Visit Number  17    Number of Visits  25    Date for PT Re-Evaluation  09/05/18    Authorization Type  [employer]-Needs 10th visit PN    PT Start Time  0932    PT Stop Time  1011    PT Time Calculation (min)  39 min    Equipment Utilized During Treatment  Gait belt    Activity Tolerance  Patient tolerated treatment well    Behavior During Therapy  WFL for tasks assessed/performed       Past Medical History:  Diagnosis Date  . Atrial flutter ([place])    s/p RFCA  . CAD (coronary artery disease)    cath 2003, occluded S-RCA, occluded S-Dx, L-LAD ok, s/p PTCA to LAD  . Cataract   . CKD (chronic kidney disease) stage 3, GFR 30-59 ml/min (HCC)   . Diabetes mellitus    diet controlled  . Hearing aid worn    bilateral  . History of kidney stones   . Long term (current) use of anticoagulants   . Neuromuscular disorder ([place])   . OSA (obstructive sleep apnea) 12/11   very mild, AHI 7/hr  . Persistent atrial fibrillation 09/26/2015  . Pure hyperglyceridemia   . PVD (peripheral vascular disease) ([place])    angioplasty of his right lower extremity in [place] by Dr.[name] 2013  . Unspecified essential hypertension   . Wears dentures    full upper and lower    Past Surgical History:  Procedure Laterality Date  . ABDOMINAL AORTOGRAM W/LOWER EXTREMITY N/A 11/15/2017   Procedure: ABDOMINAL AORTOGRAM W/LOWER EXTREMITY;  Surgeon: [name], MD;  Location: [facility];  Service: Cardiovascular;  Laterality: N/A;  . Adenosine Myoview  3/06   EF 56%, neg. Ischemia  . Adenosine Myoview  02/18/07    nml  . AMPUTATION Left 12/15/2017   Procedure: AMPUTATION BELOW KNEE;  Surgeon: Algernon [name], MD;  Location: [facility];  Service: Vascular;  Laterality: Left;  . ANGIOPLASTY  1/99   CAD- diogonal with rotational artherectomy  . Arthrectomy     of LAD & PTCA  . BLEPHAROPLASTY Bilateral   . CARDIAC CATHETERIZATION  1/00  . CARDIOVERSION  1/04  . CARDIOVERSION  5/07   hospital- a flutter  . CARPAL TUNNEL RELEASE     ? bilateral  . CATARACT EXTRACTION W/PHACO Right 07/28/2017   Procedure: CATARACT EXTRACTION PHACO AND INTRAOCULAR LENS PLACEMENT ([place]) RIGHT DIABETIC;  Surgeon: [name], MD;  Location: [facility];  Service: Ophthalmology;  Laterality: Right;  Diabetic - diet controlled  . CATARACT EXTRACTION W/PHACO Left 08/18/2017   Procedure: CATARACT EXTRACTION PHACO AND INTRAOCULAR LENS PLACEMENT ([facility]);  Surgeon: [name], MD;  Location: [facility];  Service: Ophthalmology;  Laterality: Left;  DIABETES - oral meds  . COLONOSCOPY W/ BIOPSIES  10/01/06   sigmoid polyp bx neg, 3 years  . CORONARY ANGIOPLASTY  4/03   cutting balloon PTCA pLAD into Diag  . CORONARY ARTERY BYPASS GRAFT  2000   LIMA-LAD, SVG-RCA, SVG-Diag; SVG-Diag & SVG-RCA occluded 2003  . FRACTURE SURGERY    .  HAND SURGERY  08/27/09   R thumb procedure wit Scaphoid Gragt and screws, Dr [name]  . NM MYOVIEW LTD  4/11   normal  . PERIPHERAL VASCULAR CATHETERIZATION Right 06/27/2015   Procedure: Lower Extremity Angiography;  Surgeon: Algernon [name], MD;  Location: [facility];  Service: Cardiovascular;  Laterality: Right;  . PERIPHERAL VASCULAR CATHETERIZATION  06/27/2015   Procedure: Lower Extremity Intervention;  Surgeon: Algernon [name], MD;  Location: [facility];  Service: Cardiovascular;;    There were no vitals filed for this visit.  Subjective Assessment - 08/04/18 0934    Subjective  No new complaints. No falls or pain to report.  Does have a "tender" spot on his  limb.    Patient is accompained by:  Family member    Pertinent History  L TTA, CAD, A-Fib, CKD3, DM2, PAD, HTN, HLD    Patient Stated Goals  He wants to use prosthesis so can walk & no longer needs his kids to transport & assist.     Currently in Pain?  No/denies    Pain Score  0-No pain                       [facility] Adult PT Treatment/Exercise - 08/04/18 0943      Transfers   Transfers  Sit to Stand;Stand to Sit    Sit to Stand  6: Modified independent (Device/Increase time);With upper extremity assist;With armrests;From chair/3-in-1    Stand to Sit  6: Modified independent (Device/Increase time);With upper extremity assist;With armrests;To chair/3-in-1      Ambulation/Gait   Ambulation/Gait  Yes    Ambulation/Gait Assistance  5: Supervision;4: Min guard    Ambulation/Gait Assistance Details  cues on posture, step length and weight shifting with gait.     Ambulation Distance (Feet)  230 Feet   x1, plus around gym with activity   Assistive device  Straight cane;Prosthesis    Gait Pattern  Step-through pattern;Decreased stance time - right;Decreased weight shift to left;Trunk flexed;Left flexed knee in stance    Ambulation Surface  Level;Indoor    Stairs  Yes    Stairs Assistance  5: Supervision;4: Min guard    Stairs Assistance Details (indicate cue type and reason)  cues for weight shifting to assist with balance    Stair Management Technique  One rail Right;One rail Left;Alternating pattern;Forwards;With cane    Number of Stairs  4   x2   Height of Stairs  6    Ramp  4: Min assist    Ramp Details (indicate cue type and reason)  x2 reps with cane/prosthesis with cues on posture/technique    Curb  4: Min assist    Curb Details (indicate cue type and reason)  x2 reps with cane/prosthesis with cues on posture and sequenicng.       High Level Balance   High Level Balance Activities  Negotitating around obstacles;Negotiating over obstacles    High Level Balance  Comments  with cane/prosthesis: fwd stepping over bolsters of varied heights, figure 8's around hoola hoops. cues on posture, sequencing and step length.       Prosthetics   Prosthetic Care Comments   use of Tegaderm over open areas when wearing liner/prosthesis, leave open to air with shrinker donned otherwise.     Current prosthetic wear tolerance (days/week)   daily    Current prosthetic wear tolerance (#hours/day)   wearing all awake hours    Residual limb condition  blisters on lateral aspect of limb next to tibial crest. covered with Tegaderm     Education Provided  Residual limb care;Proper wear schedule/adjustment;Proper weight-bearing schedule/adjustment    Person(s) Educated  Patient;Spouse    Education Method  Explanation;Demonstration;Verbal cues    Education Method  Verbalized understanding;Verbal cues required;Returned demonstration    Donning Prosthesis  Supervision    Doffing Prosthesis  Modified independent (device/increased time)               PT Short Term Goals - 08/01/18 1026      PT SHORT TERM GOAL #1   Title  Patient verbalizes adjusting ply socks with limb volume changes. (All STGs updated Target Date: 08/05/2018)    Baseline  MET 08/01/2018    Time  1    Period  Months    Status  Achieved      PT SHORT TERM GOAL #2   Title  Berg Balance >36/56    Baseline  MET 08/01/2018  Merrilee Jansky 36/56    Time  1    Period  Months    Status  Achieved      PT SHORT TERM GOAL #3   Title  Patient tolerates wear >12hrs total/day.     Baseline  MET 08/01/2018    Time  1    Period  Months    Status  Achieved      PT SHORT TERM GOAL #4   Title  Patient ambulates 200' with cane & prosthesis with minA.     Baseline  MET 08/01/2018    Time  1    Period  Months    Status  Achieved      PT SHORT TERM GOAL #5   Title  Patient negotiates ramps & curbs with cane with minA.     Baseline  MET 08/01/2018    Time  1    Period  Months    Status  Achieved        PT  Long Term Goals - 07/11/18 0938      PT LONG TERM GOAL #1   Title  Pt will be independent with HEP along with prosthetic care in order to indicate improved functional mobility and safety with prosthesis.  (Target Date: 09/05/17)    Time  12    Period  Weeks    Status  On-going    Target Date  09/05/17      PT LONG TERM GOAL #2   Title  Pt will tolerate wearing prosthesis >/=90% of awake hours without negative skin complications.     Time  12    Period  Weeks    Status  On-going    Target Date  09/05/17      PT LONG TERM GOAL #3   Title  Pt will ambulate w/ LRAD >500' over indoor and unlevel paved outdoor surfaces at mod I level in order to indicate safe home and limited community negotiation.     Time  12    Period  Weeks    Status  On-going    Target Date  09/05/17      PT LONG TERM GOAL #4   Title  Pt will improve BERG balance score to >/=45/56 in order to indicate decreased fall risk.     Time  12    Period  Weeks    Status  Revised      PT LONG TERM GOAL #5   Title  Patient negotiates ramps, curbs & stairs  with LRAD & prosthesis modified Independent.     Time  12    Period  Weeks    Status  New    Target Date  09/05/17            Plan - 08/04/18 0936    Clinical Impression Statement  Today's skilled session continued to focus on use of cane/prosthesis with mobility with no issues. The pt is to call [employer] to get appt for adjustments (most likely have pads added) due to new skin breakdown. The pt is progressing toward goals and should benefit from continued PT to progress toward unmet goals.     Clinical Impairments Affecting Rehab Potential  Pt motivated to progress and become as independent as possible, limited by number of co-morbidities    PT Frequency  2x / week    PT Duration  12 weeks    PT Treatment/Interventions  ADLs/Self Care Home Management;DME Instruction;Gait training;Stair training;Functional mobility training;Therapeutic activities;Therapeutic  exercise;Balance training;Neuromuscular re-education;Prosthetic Training;Passive range of motion;Energy conservation;Vestibular    PT Next Visit Plan  work towards [place],  Continue gait training with straight cane, balance training, and BLE strengthening esp hip strength.    Consulted and Agree with Plan of Care  Patient;Family member/caregiver    Family Member Consulted  wife       Patient will benefit from skilled therapeutic intervention in order to improve the following deficits and impairments:  Abnormal gait, Decreased activity tolerance, Decreased balance, Decreased endurance, Decreased knowledge of precautions, Decreased knowledge of use of DME, Decreased mobility, Decreased strength, Difficulty walking, Impaired perceived functional ability, Impaired flexibility, Impaired sensation, Prosthetic Dependency, Postural dysfunction  Visit Diagnosis: Other abnormalities of gait and mobility  Unsteadiness on feet  Muscle weakness (generalized)     Problem List Patient Active Problem List   Diagnosis Date Noted  . Hx of BKA, left ([place]) 01/13/2018  . Nausea and vomiting in adult 12/26/2017  . Pressure injury of skin 12/23/2017  . C. difficile colitis 12/22/2017  . UTI (urinary tract infection) 12/22/2017  . Hyponatremia 12/22/2017  . Normocytic anemia 12/22/2017  . Chronic anticoagulation 09/26/2015  . Persistent atrial fibrillation 09/26/2015  . PVD (peripheral vascular disease) ([place]) 06/02/2012  . BPH (benign prostatic hyperplasia) 11/16/2011  . AKI (acute kidney injury) ([place]) 08/26/2011  . Coronary artery disease 03/25/2011  . OBSTRUCTIVE SLEEP APNEA 09/15/2010  . PERIODIC LIMB MOVEMENT DISORDER 09/15/2010  . PERSONAL HISTORY OF COLONIC POLYPS 10/25/2009  . VITAMIN B12 DEFICIENCY 03/04/2009  . HYPERTRIGLYCERIDEMIA 11/24/2006  . Essential hypertension 11/24/2006    Willow Ora, PTA, [facility] [address] [place], [place]  [id number] [phone] 08/04/18, 11:43 AM   Name: Eric [name]. MRN: [id number] Date of Birth: August 07, 1928

## 2018-08-08 ENCOUNTER — Ambulatory Visit: Payer: PPO | Admitting: Physical Therapy

## 2018-08-08 ENCOUNTER — Encounter: Payer: Self-pay | Admitting: Physical Therapy

## 2018-08-08 DIAGNOSIS — M6281 Muscle weakness (generalized): Secondary | ICD-10-CM

## 2018-08-08 DIAGNOSIS — R2681 Unsteadiness on feet: Secondary | ICD-10-CM

## 2018-08-08 DIAGNOSIS — R2689 Other abnormalities of gait and mobility: Secondary | ICD-10-CM | POA: Diagnosis not present

## 2018-08-08 DIAGNOSIS — R293 Abnormal posture: Secondary | ICD-10-CM

## 2018-08-08 NOTE — Therapy (Signed)
[facility] [address] [place], [place], [address] Phone: [phone]   Fax:  [phone]  Physical Therapy Treatment  Patient Details  Name: Eric [name]. MRN: [id number] Date of Birth: [birth year] Referring Provider (PT): Leotis Pain, MD   Encounter Date: 08/08/2018  PT End of Session - 08/08/18 1011    Visit Number  18    Number of Visits  25    Date for PT Re-Evaluation  09/05/18    Authorization Type  [employer]-Needs 10th visit PN    PT Start Time  0930    PT Stop Time  1009    PT Time Calculation (min)  39 min    Equipment Utilized During Treatment  Gait belt    Activity Tolerance  Patient tolerated treatment well    Behavior During Therapy  WFL for tasks assessed/performed       Past Medical History:  Diagnosis Date  . Atrial flutter ([place])    s/p RFCA  . CAD (coronary artery disease)    cath 2003, occluded S-RCA, occluded S-Dx, L-LAD ok, s/p PTCA to LAD  . Cataract   . CKD (chronic kidney disease) stage 3, GFR 30-59 ml/min (HCC)   . Diabetes mellitus    diet controlled  . Hearing aid worn    bilateral  . History of kidney stones   . Long term (current) use of anticoagulants   . Neuromuscular disorder ([facility])   . OSA (obstructive sleep apnea) 12/11   very mild, AHI 7/hr  . Persistent atrial fibrillation 09/26/2015  . Pure hyperglyceridemia   . PVD (peripheral vascular disease) ([place])    angioplasty of his right lower extremity in [place] by Dr.[name] 2013  . Unspecified essential hypertension   . Wears dentures    full upper and lower    Past Surgical History:  Procedure Laterality Date  . ABDOMINAL AORTOGRAM W/LOWER EXTREMITY N/A 11/15/2017   Procedure: ABDOMINAL AORTOGRAM W/LOWER EXTREMITY;  Surgeon: [name], MD;  Location: [facility];  Service: Cardiovascular;  Laterality: N/A;  . Adenosine Myoview  3/06   EF 56%, neg. Ischemia  . Adenosine Myoview  02/18/07   nml  . AMPUTATION Left 12/15/2017   Procedure: AMPUTATION BELOW KNEE;  Surgeon: Algernon [name], MD;  Location: [facility];  Service: Vascular;  Laterality: Left;  . ANGIOPLASTY  1/99   CAD- diogonal with rotational artherectomy  . Arthrectomy     of LAD & PTCA  . BLEPHAROPLASTY Bilateral   . CARDIAC CATHETERIZATION  1/00  . CARDIOVERSION  1/04  . CARDIOVERSION  5/07   hospital- a flutter  . CARPAL TUNNEL RELEASE     ? bilateral  . CATARACT EXTRACTION W/PHACO Right 07/28/2017   Procedure: CATARACT EXTRACTION PHACO AND INTRAOCULAR LENS PLACEMENT ([facility]) RIGHT DIABETIC;  Surgeon: [name], MD;  Location: [facility];  Service: Ophthalmology;  Laterality: Right;  Diabetic - diet controlled  . CATARACT EXTRACTION W/PHACO Left 08/18/2017   Procedure: CATARACT EXTRACTION PHACO AND INTRAOCULAR LENS PLACEMENT ([facility]);  Surgeon: [name], MD;  Location: [facility];  Service: Ophthalmology;  Laterality: Left;  DIABETES - oral meds  . COLONOSCOPY W/ BIOPSIES  10/01/06   sigmoid polyp bx neg, 3 years  . CORONARY ANGIOPLASTY  4/03   cutting balloon PTCA pLAD into Diag  . CORONARY ARTERY BYPASS GRAFT  2000   LIMA-LAD, SVG-RCA, SVG-Diag; SVG-Diag & SVG-RCA occluded 2003  . FRACTURE SURGERY    . HAND  SURGERY  08/27/09   R thumb procedure wit Scaphoid Gragt and screws, Dr [name]  . NM MYOVIEW LTD  4/11   normal  . PERIPHERAL VASCULAR CATHETERIZATION Right 06/27/2015   Procedure: Lower Extremity Angiography;  Surgeon: Algernon [name], MD;  Location: [facility];  Service: Cardiovascular;  Laterality: Right;  . PERIPHERAL VASCULAR CATHETERIZATION  06/27/2015   Procedure: Lower Extremity Intervention;  Surgeon: Algernon [name], MD;  Location: [facility];  Service: Cardiovascular;;    There were no vitals filed for this visit.  Subjective Assessment - 08/08/18 0930    Subjective  He has been using prosthesis for community activities. His left hip has been feeling  "quirky" but bareable    Patient is accompained by:  Family member    Pertinent History  L TTA, CAD, A-Fib, CKD3, DM2, PAD, HTN, HLD    Patient Stated Goals  He wants to use prosthesis so can walk & no longer needs his kids to transport & assist.     Currently in Pain?  No/denies                       [facility] Adult PT Treatment/Exercise - 08/08/18 0930      Transfers   Transfers  Sit to Stand;Stand to Sit    Sit to Stand  6: Modified independent (Device/Increase time);With upper extremity assist;With armrests;From chair/3-in-1    Stand to Sit  6: Modified independent (Device/Increase time);With upper extremity assist;With armrests;To chair/3-in-1      Ambulation/Gait   Ambulation/Gait  Yes    Ambulation/Gait Assistance  5: Supervision;4: Min assist;4: Min guard   MinA/guard no device, supervision cane   Ambulation/Gait Assistance Details  cues on gait similar to household without device & community with cane. His prosthesis appears short ~3/8" but may be partially due to his limb seating deeper into socket. To have prosthetist check after adding pads.      Ambulation Distance (Feet)  100 Feet   100' X 2 with cane & 50' no device   Assistive device  Straight cane;Prosthesis    Gait Pattern  Step-through pattern;Decreased stance time - right;Decreased weight shift to left;Trunk flexed;Left flexed knee in stance    Ambulation Surface  Indoor;Level    Stairs  --    Stairs Assistance  --    Stair Management Technique  --    Number of Stairs  --    Height of Stairs  --    Ramp  4: Min assist   cane & prosthesis   Ramp Details (indicate cue type and reason)  tactile cues on wt shift    Curb  4: Min assist   cane & prosthesis    Curb Details (indicate cue type and reason)  hand hold assist due to left hip pain      High Level Balance   High Level Balance Activities  Negotitating around obstacles   no device to negotiate furniture   High Level Balance Comments  tactile  cues for balance reactions      Prosthetics   Prosthetic Care Comments   PT debrided dry scab from incision with no bleeding present. PT covered healing lateral blister with Tegaderm.     Current prosthetic wear tolerance (days/week)   daily    Current prosthetic wear tolerance (#hours/day)   wearing all awake hours    Residual limb condition   34m superficial blister open with healthy dry blood cover on lateral  limb, no other signs of issues or skin change.    Education Provided  Residual limb care;Proper weight-bearing schedule/adjustment;Correct ply sock adjustment    Person(s) Educated  Patient;Spouse    Education Method  Explanation;Verbal cues    Education Method  Verbalized understanding;Verbal cues required;Needs further instruction               PT Short Term Goals - 08/01/18 1026      PT SHORT TERM GOAL #1   Title  Patient verbalizes adjusting ply socks with limb volume changes. (All STGs updated Target Date: 08/05/2018)    Baseline  MET 08/01/2018    Time  1    Period  Months    Status  Achieved      PT SHORT TERM GOAL #2   Title  Berg Balance >36/56    Baseline  MET 08/01/2018  Merrilee Jansky 36/56    Time  1    Period  Months    Status  Achieved      PT SHORT TERM GOAL #3   Title  Patient tolerates wear >12hrs total/day.     Baseline  MET 08/01/2018    Time  1    Period  Months    Status  Achieved      PT SHORT TERM GOAL #4   Title  Patient ambulates 200' with cane & prosthesis with minA.     Baseline  MET 08/01/2018    Time  1    Period  Months    Status  Achieved      PT SHORT TERM GOAL #5   Title  Patient negotiates ramps & curbs with cane with minA.     Baseline  MET 08/01/2018    Time  1    Period  Months    Status  Achieved        PT Long Term Goals - 07/11/18 6295      PT LONG TERM GOAL #1   Title  Pt will be independent with HEP along with prosthetic care in order to indicate improved functional mobility and safety with prosthesis.  (Target  Date: 09/05/17)    Time  12    Period  Weeks    Status  On-going    Target Date  09/05/17      PT LONG TERM GOAL #2   Title  Pt will tolerate wearing prosthesis >/=90% of awake hours without negative skin complications.     Time  12    Period  Weeks    Status  On-going    Target Date  09/05/17      PT LONG TERM GOAL #3   Title  Pt will ambulate w/ LRAD >500' over indoor and unlevel paved outdoor surfaces at mod I level in order to indicate safe home and limited community negotiation.     Time  12    Period  Weeks    Status  On-going    Target Date  09/05/17      PT LONG TERM GOAL #4   Title  Pt will improve BERG balance score to >/=45/56 in order to indicate decreased fall risk.     Time  12    Period  Weeks    Status  Revised      PT LONG TERM GOAL #5   Title  Patient negotiates ramps, curbs & stairs with LRAD & prosthesis modified Independent.     Time  12    Period  Weeks  Status  New    Target Date  09/05/17            Plan - 08/08/18 1012    Clinical Impression Statement  Patient's prosthesis appears to be slightly short which may be due to limb volume changes or prosthetic height issue. He has appointment with prosthetist after PT today and will discuss pads & ht as PT recommended. Patient appears to have potential to ambulate household distances without device to enable carrying light items and using cane for community outings.     Clinical Impairments Affecting Rehab Potential  Pt motivated to progress and become as independent as possible, limited by number of co-morbidities    PT Frequency  2x / week    PT Duration  12 weeks    PT Treatment/Interventions  ADLs/Self Care Home Management;DME Instruction;Gait training;Stair training;Functional mobility training;Therapeutic activities;Therapeutic exercise;Balance training;Neuromuscular re-education;Prosthetic Training;Passive range of motion;Energy conservation;Vestibular    PT Next Visit Plan  work towards [place],   Continue gait training with straight cane for community & no device for household, balance training, and BLE strengthening esp hip strength.    Consulted and Agree with Plan of Care  Patient;Family member/caregiver    Family Member Consulted  wife       Patient will benefit from skilled therapeutic intervention in order to improve the following deficits and impairments:  Abnormal gait, Decreased activity tolerance, Decreased balance, Decreased endurance, Decreased knowledge of precautions, Decreased knowledge of use of DME, Decreased mobility, Decreased strength, Difficulty walking, Impaired perceived functional ability, Impaired flexibility, Impaired sensation, Prosthetic Dependency, Postural dysfunction  Visit Diagnosis: Other abnormalities of gait and mobility  Unsteadiness on feet  Muscle weakness (generalized)  Abnormal posture     Problem List Patient Active Problem List   Diagnosis Date Noted  . Hx of BKA, left ([place]) 01/13/2018  . Nausea and vomiting in adult 12/26/2017  . Pressure injury of skin 12/23/2017  . C. difficile colitis 12/22/2017  . UTI (urinary tract infection) 12/22/2017  . Hyponatremia 12/22/2017  . Normocytic anemia 12/22/2017  . Chronic anticoagulation 09/26/2015  . Persistent atrial fibrillation 09/26/2015  . PVD (peripheral vascular disease) ([place]) 06/02/2012  . BPH (benign prostatic hyperplasia) 11/16/2011  . AKI (acute kidney injury) ([place]) 08/26/2011  . Coronary artery disease 03/25/2011  . OBSTRUCTIVE SLEEP APNEA 09/15/2010  . PERIODIC LIMB MOVEMENT DISORDER 09/15/2010  . PERSONAL HISTORY OF COLONIC POLYPS 10/25/2009  . VITAMIN B12 DEFICIENCY 03/04/2009  . HYPERTRIGLYCERIDEMIA 11/24/2006  . Essential hypertension 11/24/2006    Jamey Reas PT, DPT 08/08/2018, 10:46 AM  [facility] [address] [place], [place], [address] Phone: [phone]   Fax:  [phone]  Name: Keagan Brislin. MRN: [id number] Date of Birth: 05/02/1928

## 2018-08-11 ENCOUNTER — Encounter: Payer: PPO | Admitting: Physical Therapy

## 2018-08-12 ENCOUNTER — Ambulatory Visit: Payer: PPO

## 2018-08-15 ENCOUNTER — Ambulatory Visit: Payer: PPO | Attending: Vascular Surgery | Admitting: Physical Therapy

## 2018-08-15 ENCOUNTER — Encounter: Payer: Self-pay | Admitting: Physical Therapy

## 2018-08-15 DIAGNOSIS — R2689 Other abnormalities of gait and mobility: Secondary | ICD-10-CM | POA: Diagnosis not present

## 2018-08-15 DIAGNOSIS — R293 Abnormal posture: Secondary | ICD-10-CM | POA: Diagnosis not present

## 2018-08-15 DIAGNOSIS — M6281 Muscle weakness (generalized): Secondary | ICD-10-CM | POA: Diagnosis not present

## 2018-08-15 DIAGNOSIS — R2681 Unsteadiness on feet: Secondary | ICD-10-CM | POA: Diagnosis not present

## 2018-08-16 NOTE — Therapy (Signed)
[facility] [address] [place], [place], [address] Phone: [phone]   Fax:  [phone]  Physical Therapy Treatment  Patient Details  Name: Eric [name]. MRN: [id number] Date of Birth: 1928-03-23 Referring Provider (PT): Leotis Pain, MD   Encounter Date: 08/15/2018  PT End of Session - 08/15/18 1212    Visit Number  19    Number of Visits  25    Date for PT Re-Evaluation  09/05/18    Authorization Type  [employer]-Needs 10th visit PN    PT Start Time  0930    PT Stop Time  1015    PT Time Calculation (min)  45 min    Equipment Utilized During Treatment  Gait belt    Activity Tolerance  Patient tolerated treatment well    Behavior During Therapy  WFL for tasks assessed/performed       Past Medical History:  Diagnosis Date  . Atrial flutter ([place])    s/p RFCA  . CAD (coronary artery disease)    cath 2003, occluded S-RCA, occluded S-Dx, L-LAD ok, s/p PTCA to LAD  . Cataract   . CKD (chronic kidney disease) stage 3, GFR 30-59 ml/min (HCC)   . Diabetes mellitus    diet controlled  . Hearing aid worn    bilateral  . History of kidney stones   . Long term (current) use of anticoagulants   . Neuromuscular disorder ([place])   . OSA (obstructive sleep apnea) 12/11   very mild, AHI 7/hr  . Persistent atrial fibrillation 09/26/2015  . Pure hyperglyceridemia   . PVD (peripheral vascular disease) ([place])    angioplasty of his right lower extremity in [place] by Dr.[name] 2013  . Unspecified essential hypertension   . Wears dentures    full upper and lower    Past Surgical History:  Procedure Laterality Date  . ABDOMINAL AORTOGRAM W/LOWER EXTREMITY N/A 11/15/2017   Procedure: ABDOMINAL AORTOGRAM W/LOWER EXTREMITY;  Surgeon: [name], MD;  Location: [facility];  Service: Cardiovascular;  Laterality: N/A;  . Adenosine Myoview  3/06   EF 56%, neg. Ischemia  . Adenosine Myoview  02/18/07   nml  . AMPUTATION Left 12/15/2017   Procedure: AMPUTATION BELOW KNEE;  Surgeon: Algernon [name], MD;  Location: [facility];  Service: Vascular;  Laterality: Left;  . ANGIOPLASTY  1/99   CAD- diogonal with rotational artherectomy  . Arthrectomy     of LAD & PTCA  . BLEPHAROPLASTY Bilateral   . CARDIAC CATHETERIZATION  1/00  . CARDIOVERSION  1/04  . CARDIOVERSION  5/07   hospital- a flutter  . CARPAL TUNNEL RELEASE     ? bilateral  . CATARACT EXTRACTION W/PHACO Right 07/28/2017   Procedure: CATARACT EXTRACTION PHACO AND INTRAOCULAR LENS PLACEMENT ([place]) RIGHT DIABETIC;  Surgeon: [name], MD;  Location: [facility];  Service: Ophthalmology;  Laterality: Right;  Diabetic - diet controlled  . CATARACT EXTRACTION W/PHACO Left 08/18/2017   Procedure: CATARACT EXTRACTION PHACO AND INTRAOCULAR LENS PLACEMENT ([facility]);  Surgeon: [name], MD;  Location: [facility];  Service: Ophthalmology;  Laterality: Left;  DIABETES - oral meds  . COLONOSCOPY W/ BIOPSIES  10/01/06   sigmoid polyp bx neg, 3 years  . CORONARY ANGIOPLASTY  4/03   cutting balloon PTCA pLAD into Diag  . CORONARY ARTERY BYPASS GRAFT  2000   LIMA-LAD, SVG-RCA, SVG-Diag; SVG-Diag & SVG-RCA occluded 2003  . FRACTURE SURGERY    . HAND  SURGERY  08/27/09   R thumb procedure wit Scaphoid Gragt and screws, Dr [name]  . NM MYOVIEW LTD  4/11   normal  . PERIPHERAL VASCULAR CATHETERIZATION Right 06/27/2015   Procedure: Lower Extremity Angiography;  Surgeon: Algernon [name], MD;  Location: [facility];  Service: Cardiovascular;  Laterality: Right;  . PERIPHERAL VASCULAR CATHETERIZATION  06/27/2015   Procedure: Lower Extremity Intervention;  Surgeon: Algernon [name], MD;  Location: [facility];  Service: Cardiovascular;;    There were no vitals filed for this visit.  Subjective Assessment - 08/15/18 0930    Subjective  He has been using cane to go out in community.     Patient is accompained by:   Family member    Pertinent History  L TTA, CAD, A-Fib, CKD3, DM2, PAD, HTN, HLD    Patient Stated Goals  He wants to use prosthesis so can walk & no longer needs his kids to transport & assist.     Currently in Pain?  No/denies         [facility] PT Assessment - 08/15/18 0930      Berg Balance Test   Sit to Stand  Able to stand  independently using hands    Standing Unsupported  Able to stand safely 2 minutes    Sitting with Back Unsupported but Feet Supported on Floor or Stool  Able to sit safely and securely 2 minutes    Stand to Sit  Sits safely with minimal use of hands    Transfers  Able to transfer safely, minor use of hands    Standing Unsupported with Eyes Closed  Able to stand 10 seconds safely    Standing Ubsupported with Feet Together  Able to place feet together independently and stand for 1 minute with supervision    From Standing, Reach Forward with Outstretched Arm  Can reach forward >12 cm safely (5")    From Standing Position, Pick up Object from [place] to pick up shoe safely and easily    From Standing Position, Turn to Look Behind Over each Shoulder  Turn sideways only but maintains balance    Turn 360 Degrees  Needs close supervision or verbal cueing    Standing Unsupported, Alternately Place Feet on Step/Stool  Needs assistance to keep from falling or unable to try    Standing Unsupported, One Foot in [place] to take small step independently and hold 30 seconds    Standing on One Leg  Tries to lift leg/unable to hold 3 seconds but remains standing independently    Total Score  39    Berg comment:  Initial Berg 16/56, on 08/01/18 was 36/56                   [facility] Adult PT Treatment/Exercise - 08/15/18 0930      Ambulation/Gait   Ambulation/Gait  Yes    Ambulation/Gait Assistance  5: Supervision;4: Min assist   supervision cane & minA no device   Ambulation/Gait Assistance Details  tactile & verbal cues on upright posture    Ambulation Distance  (Feet)  300 Feet   300' X 2 cane & 60' no device   Assistive device  Straight cane;Prosthesis;None    Ambulation Surface  Indoor;Level;Outdoor;Paved;Grass   grass 20'   Stairs  Yes    Stairs Assistance  5: Supervision    Stairs Assistance Details (indicate cue type and reason)  2 steps leading RLE & 2 steps  leading LLE    Stair Management Technique  One rail Left;With cane;Step to pattern;Forwards    Number of Stairs  4    Ramp  5: Supervision   cane & prosthesis   Ramp Details (indicate cue type and reason)  verbal cues on upright posture & wt shift    Curb  4: Min assist   cane & prosthesis, supervision with support on car hood   Curb Details (indicate cue type and reason)  LE strength requires HHA or touch on car hood. Worked on leading with both LEs.      Prosthetics   Prosthetic Care Comments   PT instructed on using wet preferrably or dry cloth if need to rub LE when removing prosthesis to decrease scratching & risk of breaking skin. Residual limb may itch if light coating of sweat when exposed to air.     Current prosthetic wear tolerance (days/week)   daily    Current prosthetic wear tolerance (#hours/day)   wearing all awake hours    Residual limb condition   no open areas.     Education Provided  Skin check;Residual limb care;Other (comment)   see prosthetic care comments   Person(s) Educated  Patient;Spouse    Education Method  Explanation;Verbal cues;Demonstration    Education Method  Verbalized understanding;Verbal cues required;Needs further instruction    Donning Prosthesis  Modified independent (device/increased time)    Doffing Prosthesis  Modified independent (device/increased time)             PT Education - 08/15/18 1000    Education Details  using stairs at his home with 2 wide rails leading 2 steps with RLE & 2 steps with LLE to strength LEs functionally. Only do when adult child present.    Person(s) Educated  Patient;Spouse    Methods   Explanation;Demonstration;Verbal cues    Comprehension  Verbalized understanding;Need further instruction       PT Short Term Goals - 08/01/18 1026      PT SHORT TERM GOAL #1   Title  Patient verbalizes adjusting ply socks with limb volume changes. (All STGs updated Target Date: 08/05/2018)    Baseline  MET 08/01/2018    Time  1    Period  Months    Status  Achieved      PT SHORT TERM GOAL #2   Title  Berg Balance >36/56    Baseline  MET 08/01/2018  Merrilee Jansky 36/56    Time  1    Period  Months    Status  Achieved      PT SHORT TERM GOAL #3   Title  Patient tolerates wear >12hrs total/day.     Baseline  MET 08/01/2018    Time  1    Period  Months    Status  Achieved      PT SHORT TERM GOAL #4   Title  Patient ambulates 200' with cane & prosthesis with minA.     Baseline  MET 08/01/2018    Time  1    Period  Months    Status  Achieved      PT SHORT TERM GOAL #5   Title  Patient negotiates ramps & curbs with cane with minA.     Baseline  MET 08/01/2018    Time  1    Period  Months    Status  Achieved        PT Long Term Goals - 07/11/18 9794      PT  LONG TERM GOAL #1   Title  Pt will be independent with HEP along with prosthetic care in order to indicate improved functional mobility and safety with prosthesis.  (Target Date: 09/05/17)    Time  12    Period  Weeks    Status  On-going    Target Date  09/05/17      PT LONG TERM GOAL #2   Title  Pt will tolerate wearing prosthesis >/=90% of awake hours without negative skin complications.     Time  12    Period  Weeks    Status  On-going    Target Date  09/05/17      PT LONG TERM GOAL #3   Title  Pt will ambulate w/ LRAD >500' over indoor and unlevel paved outdoor surfaces at mod I level in order to indicate safe home and limited community negotiation.     Time  12    Period  Weeks    Status  On-going    Target Date  09/05/17      PT LONG TERM GOAL #4   Title  Pt will improve BERG balance score to >/=45/56 in  order to indicate decreased fall risk.     Time  12    Period  Weeks    Status  Revised      PT LONG TERM GOAL #5   Title  Patient negotiates ramps, curbs & stairs with LRAD & prosthesis modified Independent.     Time  12    Period  Weeks    Status  New    Target Date  09/05/17            Plan - 08/15/18 1800    Clinical Impression Statement  Patient's lower extremity strength is limiting his ability to negotiate curb >4" in height. He seems to understand PT recommendation to perform his steps with 1 rail & cane with adult child supervision to work on strength. PT also worked on household gait without cane.    Clinical Impairments Affecting Rehab Potential  Pt motivated to progress and become as independent as possible, limited by number of co-morbidities    PT Frequency  2x / week    PT Duration  12 weeks    PT Treatment/Interventions  ADLs/Self Care Home Management;DME Instruction;Gait training;Stair training;Functional mobility training;Therapeutic activities;Therapeutic exercise;Balance training;Neuromuscular re-education;Prosthetic Training;Passive range of motion;Energy conservation;Vestibular    PT Next Visit Plan  work towards [place],  Continue gait training with straight cane for community & no device for household, balance training, and BLE strengthening esp hip strength.    Consulted and Agree with Plan of Care  Patient;Family member/caregiver    Family Member Consulted  wife       Patient will benefit from skilled therapeutic intervention in order to improve the following deficits and impairments:  Abnormal gait, Decreased activity tolerance, Decreased balance, Decreased endurance, Decreased knowledge of precautions, Decreased knowledge of use of DME, Decreased mobility, Decreased strength, Difficulty walking, Impaired perceived functional ability, Impaired flexibility, Impaired sensation, Prosthetic Dependency, Postural dysfunction  Visit Diagnosis: Other abnormalities of  gait and mobility  Unsteadiness on feet  Muscle weakness (generalized)  Abnormal posture     Problem List Patient Active Problem List   Diagnosis Date Noted  . Hx of BKA, left ([place]) 01/13/2018  . Nausea and vomiting in adult 12/26/2017  . Pressure injury of skin 12/23/2017  . C. difficile colitis 12/22/2017  . UTI (urinary tract infection) 12/22/2017  . Hyponatremia  12/22/2017  . Normocytic anemia 12/22/2017  . Chronic anticoagulation 09/26/2015  . Persistent atrial fibrillation 09/26/2015  . PVD (peripheral vascular disease) ([place]) 06/02/2012  . BPH (benign prostatic hyperplasia) 11/16/2011  . AKI (acute kidney injury) ([place]) 08/26/2011  . Coronary artery disease 03/25/2011  . OBSTRUCTIVE SLEEP APNEA 09/15/2010  . PERIODIC LIMB MOVEMENT DISORDER 09/15/2010  . PERSONAL HISTORY OF COLONIC POLYPS 10/25/2009  . VITAMIN B12 DEFICIENCY 03/04/2009  . HYPERTRIGLYCERIDEMIA 11/24/2006  . Essential hypertension 11/24/2006    Jamey Reas PT, DPT 08/16/2018, 7:06 AM  [facility] [address] [place], [place], [address] Phone: [phone]   Fax:  [phone]  Name: Eric [name]. MRN: [id number] Date of Birth: Apr 18, 1928

## 2018-08-17 DIAGNOSIS — N401 Enlarged prostate with lower urinary tract symptoms: Secondary | ICD-10-CM | POA: Diagnosis not present

## 2018-08-17 DIAGNOSIS — N261 Atrophy of kidney (terminal): Secondary | ICD-10-CM | POA: Diagnosis not present

## 2018-08-17 DIAGNOSIS — R3914 Feeling of incomplete bladder emptying: Secondary | ICD-10-CM | POA: Diagnosis not present

## 2018-08-17 DIAGNOSIS — R8271 Bacteriuria: Secondary | ICD-10-CM | POA: Diagnosis not present

## 2018-08-18 ENCOUNTER — Ambulatory Visit: Payer: PPO | Admitting: Rehabilitation

## 2018-08-19 ENCOUNTER — Encounter: Payer: Self-pay | Admitting: Family Medicine

## 2018-08-19 ENCOUNTER — Encounter: Payer: Self-pay | Admitting: Physical Therapy

## 2018-08-19 ENCOUNTER — Ambulatory Visit: Payer: PPO | Admitting: Physical Therapy

## 2018-08-19 DIAGNOSIS — R2681 Unsteadiness on feet: Secondary | ICD-10-CM

## 2018-08-19 DIAGNOSIS — M6281 Muscle weakness (generalized): Secondary | ICD-10-CM

## 2018-08-19 DIAGNOSIS — R2689 Other abnormalities of gait and mobility: Secondary | ICD-10-CM | POA: Diagnosis not present

## 2018-08-19 DIAGNOSIS — R293 Abnormal posture: Secondary | ICD-10-CM

## 2018-08-19 NOTE — Patient Instructions (Signed)
Access Code: [email or web address]  URL: [email or web address]  Date: 08/19/2018  Prepared by: Willow Ora   Exercises  Seated March with Resistance - 10 reps - 1 sets - 1x daily - 5x weekly  Seated Hip Abduction - 10 reps - 1 sets - 5 hold - 1x daily - 5x weekly

## 2018-08-21 NOTE — Therapy (Addendum)
[facility] [address] [place], [place], [address] Phone: [phone]   Fax:  [phone]  Physical Therapy Treatment  Patient Details  Name: Eric [name]. MRN: [id number] Date of Birth: 1927/12/03 Referring Provider (PT): Eric Pain, MD   Encounter Date: 08/19/2018  Progress Note Reporting Period 07/11/2018 to 08/19/2018  See note below for Objective Data and Assessment of Progress/Goals.   Eric [name], PT, DPT PT Specializing in [place] 08/23/18 7:02 AM Phone:  [phone]  Fax:  [phone] [address] [place],  [address]        08/19/18 [phone]  PT Visits / Re-Eval  Visit Number 20  Number of Visits 25  Date for PT Re-Evaluation 09/05/18  Authorization  Authorization Type [employer]-Needs 10th visit PN  PT Time Calculation  PT Start Time 0933  PT Stop Time 1015  PT Time Calculation (min) 42 min  PT - End of Session  Equipment Utilized During Treatment Gait belt  Activity Tolerance Patient tolerated treatment well  Behavior During Therapy WFL for tasks assessed/performed    Past Medical History:  Diagnosis Date  . Atrial flutter ([place])    s/p RFCA  . CAD (coronary artery disease)    cath 2003, occluded S-RCA, occluded S-Dx, L-LAD ok, s/p PTCA to LAD  . Cataract   . CKD (chronic kidney disease) stage 3, GFR 30-59 ml/min (HCC)   . Diabetes mellitus    diet controlled  . Hearing aid worn    bilateral  . History of kidney stones   . Long term (current) use of anticoagulants   . Neuromuscular disorder ([place])   . OSA (obstructive sleep apnea) 12/11   very mild, AHI 7/hr  . Persistent atrial fibrillation 09/26/2015  . Pure hyperglyceridemia   . PVD (peripheral vascular disease) ([place])    angioplasty of his right lower extremity in [place] by Dr.[name] 2013  . Unspecified essential hypertension   . Wears dentures     full upper and lower    Past Surgical History:  Procedure Laterality Date  . ABDOMINAL AORTOGRAM W/LOWER EXTREMITY N/A 11/15/2017   Procedure: ABDOMINAL AORTOGRAM W/LOWER EXTREMITY;  Surgeon: [name], MD;  Location: [facility];  Service: Cardiovascular;  Laterality: N/A;  . Adenosine Myoview  3/06   EF 56%, neg. Ischemia  . Adenosine Myoview  02/18/07   nml  . AMPUTATION Left 12/15/2017   Procedure: AMPUTATION BELOW KNEE;  Surgeon: Algernon [name], MD;  Location: [facility];  Service: Vascular;  Laterality: Left;  . ANGIOPLASTY  1/99   CAD- diogonal with rotational artherectomy  . Arthrectomy     of LAD & PTCA  . BLEPHAROPLASTY Bilateral   . CARDIAC CATHETERIZATION  1/00  . CARDIOVERSION  1/04  . CARDIOVERSION  5/07   hospital- a flutter  . CARPAL TUNNEL RELEASE     ? bilateral  . CATARACT EXTRACTION W/PHACO Right 07/28/2017   Procedure: CATARACT EXTRACTION PHACO AND INTRAOCULAR LENS PLACEMENT ([place]) RIGHT DIABETIC;  Surgeon: [name], MD;  Location: [facility];  Service: Ophthalmology;  Laterality: Right;  Diabetic - diet controlled  . CATARACT EXTRACTION W/PHACO Left 08/18/2017   Procedure: CATARACT EXTRACTION PHACO AND INTRAOCULAR LENS PLACEMENT ([facility]);  Surgeon: [name], MD;  Location: [facility];  Service: Ophthalmology;  Laterality: Left;  DIABETES - oral meds  . COLONOSCOPY W/ BIOPSIES  10/01/06   sigmoid polyp bx neg, 3 years  .  CORONARY ANGIOPLASTY  4/03   cutting balloon PTCA pLAD into Diag  . CORONARY ARTERY BYPASS GRAFT  2000   LIMA-LAD, SVG-RCA, SVG-Diag; SVG-Diag & SVG-RCA occluded 2003  . FRACTURE SURGERY    . HAND SURGERY  08/27/09   R thumb procedure wit Scaphoid Gragt and screws, Dr [name]  . NM MYOVIEW LTD  4/11   normal  . PERIPHERAL VASCULAR CATHETERIZATION Right 06/27/2015   Procedure: Lower Extremity Angiography;  Surgeon: Algernon [name], MD;  Location: [facility];  Service: Cardiovascular;  Laterality:  Right;  . PERIPHERAL VASCULAR CATHETERIZATION  06/27/2015   Procedure: Lower Extremity Intervention;  Surgeon: Algernon [name], MD;  Location: [facility];  Service: Cardiovascular;;    There were no vitals filed for this visit.     08/19/18 0935  Symptoms/Limitations  Subjective No new complaitns. No falls to report or [name].  Patient is accompained by: Family member  Pertinent History L TTA, CAD, A-Fib, CKD3, DM2, PAD, HTN, HLD  Patient Stated Goals He wants to use prosthesis so can walk & no longer needs his kids to transport & assist.   [name] Assessment  Currently in [name]? No/denies  [name] Score 0      08/19/18 0947  Transfers  Transfers Sit to Stand;Stand to Sit  Sit to Stand 6: Modified independent (Device/Increase time);With upper extremity assist;With armrests;From chair/3-in-1  Stand to Sit 6: Modified independent (Device/Increase time);With upper extremity assist;With armrests;To chair/3-in-1  Ambulation/Gait  Ambulation/Gait Yes  Ambulation/Gait Assistance 5: Supervision  Ambulation/Gait Assistance Details cues for posture and equal step length with use of cane/prosthesis  Ambulation Distance (Feet) 430 Feet (x 2 cane; 120 x1 with no AD)  Assistive device Straight cane;Prosthesis;None  Gait Pattern Step-through pattern;Decreased stance time - right;Decreased weight shift to left;Trunk flexed;Left flexed knee in stance  Ambulation Surface Level;Indoor  Knee/Hip Exercises: Seated  Clamshell with TheraBand Blue (10 reps with 5 sec holds)  Marching AROM;Strengthening;Both;1 set;10 reps;Limitations  Marching Limitations with blue band, alteranating LE"s with 3 sec holds.   Prosthetics  Current prosthetic wear tolerance (days/week)  daily  Current prosthetic wear tolerance (#hours/day)  wearing all awake hours  Residual limb condition  intact per pt report, scab still present  Education Provided Residual limb care;Proper weight-bearing schedule/adjustment  Person(s)  Educated Patient;Spouse  Education Method Explanation;Demonstration;Verbal cues  Education Method Verbalized understanding;Verbal cues required;Needs further instruction  Donning Prosthesis 6  Doffing Prosthesis 6       08/19/18 2136  PT Education  Education Details added 2 new hip strengthening ex's to HEP.  Person(s) Educated Patient  Methods Explanation;Demonstration;Verbal cues;Handout  Comprehension Verbalized understanding;Returned demonstration;Verbal cues required;Need further instruction        PT Short Term Goals - 08/01/18 1026      PT SHORT TERM GOAL #1   Title  Patient verbalizes adjusting ply socks with limb volume changes. (All STGs updated Target Date: 08/05/2018)    Baseline  MET 08/01/2018    Time  1    Period  Months    Status  Achieved      PT SHORT TERM GOAL #2   Title  Berg Balance >36/56    Baseline  MET 08/01/2018  Eric [name] 36/56    Time  1    Period  Months    Status  Achieved      PT SHORT TERM GOAL #3   Title  Patient tolerates wear >12hrs total/day.     Baseline  MET 08/01/2018  Time  1    Period  Months    Status  Achieved      PT SHORT TERM GOAL #4   Title  Patient ambulates 200' with cane & prosthesis with minA.     Baseline  MET 08/01/2018    Time  1    Period  Months    Status  Achieved      PT SHORT TERM GOAL #5   Title  Patient negotiates ramps & curbs with cane with minA.     Baseline  MET 08/01/2018    Time  1    Period  Months    Status  Achieved        PT Long Term Goals - 07/11/18 7654      PT LONG TERM GOAL #1   Title  Pt will be independent with HEP along with prosthetic care in order to indicate improved functional mobility and safety with prosthesis.  (Target Date: 09/05/17)    Time  12    Period  Weeks    Status  On-going    Target Date  09/05/17      PT LONG TERM GOAL #2   Title  Pt will tolerate wearing prosthesis >/=90% of awake hours without negative skin complications.     Time  12    Period  Weeks     Status  On-going    Target Date  09/05/17      PT LONG TERM GOAL #3   Title  Pt will ambulate w/ LRAD >500' over indoor and unlevel paved outdoor surfaces at mod I level in order to indicate safe home and limited community negotiation.     Time  12    Period  Weeks    Status  On-going    Target Date  09/05/17      PT LONG TERM GOAL #4   Title  Pt will improve BERG balance score to >/=45/56 in order to indicate decreased fall risk.     Time  12    Period  Weeks    Status  Revised      PT LONG TERM GOAL #5   Title  Patient negotiates ramps, curbs & stairs with LRAD & prosthesis modified Independent.     Time  12    Period  Weeks    Status  New    Target Date  09/05/17          08/19/18 0946  Plan  Clinical Impression Statement Today's skilled session continued to focus on gait with cane vs no AD and on LE strengthening. No issues reported in session with gait/exercises. The pt is progressing toward goals and should benefit from continued PT to progress toward unmet goals.   Pt will benefit from skilled therapeutic intervention in order to improve on the following deficits Abnormal gait;Decreased activity tolerance;Decreased balance;Decreased endurance;Decreased knowledge of precautions;Decreased knowledge of use of DME;Decreased mobility;Decreased strength;Difficulty walking;Impaired perceived functional ability;Impaired flexibility;Impaired sensation;Prosthetic Dependency;Postural dysfunction  Clinical Impairments Affecting Rehab Potential Pt motivated to progress and become as independent as possible, limited by number of co-morbidities  PT Frequency 2x / week  PT Duration 12 weeks  PT Treatment/Interventions ADLs/Self Care Home Management;DME Instruction;Gait training;Stair training;Functional mobility training;Therapeutic activities;Therapeutic exercise;Balance training;Neuromuscular re-education;Prosthetic Training;Passive range of motion;Energy conservation;Vestibular  PT  Next Visit Plan work towards [place],  Continue gait training with straight cane for community & no device for household, balance training, and BLE strengthening esp hip strength.  Consulted and Agree with  Plan of Care Patient;Family member/caregiver  Family Member Consulted wife         Patient will benefit from skilled therapeutic intervention in order to improve the following deficits and impairments:  Abnormal gait, Decreased activity tolerance, Decreased balance, Decreased endurance, Decreased knowledge of precautions, Decreased knowledge of use of DME, Decreased mobility, Decreased strength, Difficulty walking, Impaired perceived functional ability, Impaired flexibility, Impaired sensation, Prosthetic Dependency, Postural dysfunction  Visit Diagnosis: Other abnormalities of gait and mobility  Unsteadiness on feet  Muscle weakness (generalized)  Abnormal posture     Problem List Patient Active Problem List   Diagnosis Date Noted  . Hx of BKA, left ([place]) 01/13/2018  . Nausea and vomiting in adult 12/26/2017  . Pressure injury of skin 12/23/2017  . C. difficile colitis 12/22/2017  . UTI (urinary tract infection) 12/22/2017  . Hyponatremia 12/22/2017  . Normocytic anemia 12/22/2017  . Chronic anticoagulation 09/26/2015  . Persistent atrial fibrillation 09/26/2015  . PVD (peripheral vascular disease) ([place]) 06/02/2012  . BPH (benign prostatic hyperplasia) 11/16/2011  . AKI (acute kidney injury) ([place]) 08/26/2011  . Coronary artery disease 03/25/2011  . OBSTRUCTIVE SLEEP APNEA 09/15/2010  . PERIODIC LIMB MOVEMENT DISORDER 09/15/2010  . PERSONAL HISTORY OF COLONIC POLYPS 10/25/2009  . VITAMIN B12 DEFICIENCY 03/04/2009  . HYPERTRIGLYCERIDEMIA 11/24/2006  . Essential hypertension 11/24/2006    Eric [name], Eric [name], Eric [name] [address] [place], [place] [address] [phone] 08/21/18, 9:32 PM   Name: Eric [name]. MRN:  [id number] Date of Birth: 1928-07-13

## 2018-08-22 ENCOUNTER — Encounter: Payer: Self-pay | Admitting: Physical Therapy

## 2018-08-22 ENCOUNTER — Ambulatory Visit: Payer: PPO | Admitting: Physical Therapy

## 2018-08-22 DIAGNOSIS — R2681 Unsteadiness on feet: Secondary | ICD-10-CM

## 2018-08-22 DIAGNOSIS — R2689 Other abnormalities of gait and mobility: Secondary | ICD-10-CM | POA: Diagnosis not present

## 2018-08-22 DIAGNOSIS — M6281 Muscle weakness (generalized): Secondary | ICD-10-CM

## 2018-08-22 MED ORDER — APIXABAN 2.5 MG PO TABS: 2.5000 mg | ORAL_TABLET | Freq: Two times a day (BID) | ORAL | 1 refills | Status: DC

## 2018-08-22 NOTE — Therapy (Signed)
[facility] [address] [place], [place], [address] Phone: [phone]   Fax:  [phone]  Physical Therapy Treatment  Patient Details  Name: Eric [name]. MRN: [id number] Date of Birth: 02-28-1928 Referring Provider (PT): Leotis Pain, MD   Encounter Date: 08/22/2018  PT End of Session - 08/22/18 0937    Visit Number  21    Number of Visits  25    Date for PT Re-Evaluation  09/05/18    Authorization Type  [employer]-Needs 10th visit PN    PT Start Time  0933    PT Stop Time  1015    PT Time Calculation (min)  42 min    Equipment Utilized During Treatment  Gait belt    Activity Tolerance  Patient tolerated treatment well;No increased pain    Behavior During Therapy  WFL for tasks assessed/performed       Past Medical History:  Diagnosis Date  . Atrial flutter ([place])    s/p RFCA  . CAD (coronary artery disease)    cath 2003, occluded S-RCA, occluded S-Dx, L-LAD ok, s/p PTCA to LAD  . Cataract   . CKD (chronic kidney disease) stage 3, GFR 30-59 ml/min (HCC)   . Diabetes mellitus    diet controlled  . Hearing aid worn    bilateral  . History of kidney stones   . Long term (current) use of anticoagulants   . Neuromuscular disorder ([place])   . OSA (obstructive sleep apnea) 12/11   very mild, AHI 7/hr  . Persistent atrial fibrillation 09/26/2015  . Pure hyperglyceridemia   . PVD (peripheral vascular disease) ([place])    angioplasty of his right lower extremity in [place] by Dr.[name] 2013  . Unspecified essential hypertension   . Wears dentures    full upper and lower    Past Surgical History:  Procedure Laterality Date  . ABDOMINAL AORTOGRAM W/LOWER EXTREMITY N/A 11/15/2017   Procedure: ABDOMINAL AORTOGRAM W/LOWER EXTREMITY;  Surgeon: [name], MD;  Location: [facility];  Service: Cardiovascular;  Laterality: N/A;  . Adenosine Myoview  3/06   EF 56%, neg. Ischemia  . Adenosine  Myoview  02/18/07   nml  . AMPUTATION Left 12/15/2017   Procedure: AMPUTATION BELOW KNEE;  Surgeon: Algernon [name], MD;  Location: [facility];  Service: Vascular;  Laterality: Left;  . ANGIOPLASTY  1/99   CAD- diogonal with rotational artherectomy  . Arthrectomy     of LAD & PTCA  . BLEPHAROPLASTY Bilateral   . CARDIAC CATHETERIZATION  1/00  . CARDIOVERSION  1/04  . CARDIOVERSION  5/07   hospital- a flutter  . CARPAL TUNNEL RELEASE     ? bilateral  . CATARACT EXTRACTION W/PHACO Right 07/28/2017   Procedure: CATARACT EXTRACTION PHACO AND INTRAOCULAR LENS PLACEMENT ([facility]) RIGHT DIABETIC;  Surgeon: [name], MD;  Location: [facility];  Service: Ophthalmology;  Laterality: Right;  Diabetic - diet controlled  . CATARACT EXTRACTION W/PHACO Left 08/18/2017   Procedure: CATARACT EXTRACTION PHACO AND INTRAOCULAR LENS PLACEMENT ([facility]);  Surgeon: [name], MD;  Location: [facility];  Service: Ophthalmology;  Laterality: Left;  DIABETES - oral meds  . COLONOSCOPY W/ BIOPSIES  10/01/06   sigmoid polyp bx neg, 3 years  . CORONARY ANGIOPLASTY  4/03   cutting balloon PTCA pLAD into Diag  . CORONARY ARTERY BYPASS GRAFT  2000   LIMA-LAD, SVG-RCA, SVG-Diag; SVG-Diag & SVG-RCA occluded 2003  . FRACTURE SURGERY    .  HAND SURGERY  08/27/09   R thumb procedure wit Scaphoid Gragt and screws, Dr [name]  . NM MYOVIEW LTD  4/11   normal  . PERIPHERAL VASCULAR CATHETERIZATION Right 06/27/2015   Procedure: Lower Extremity Angiography;  Surgeon: Algernon [name], MD;  Location: [facility];  Service: Cardiovascular;  Laterality: Right;  . PERIPHERAL VASCULAR CATHETERIZATION  06/27/2015   Procedure: Lower Extremity Intervention;  Surgeon: Algernon [name], MD;  Location: [facility];  Service: Cardiovascular;;    There were no vitals filed for this visit.  Subjective Assessment - 08/22/18 0936    Subjective  Does report having a spot on his leg that gets sore with  prolonged walking/prosthetic wear.     Patient is accompained by:  Family member    Pertinent History  L TTA, CAD, A-Fib, CKD3, DM2, PAD, HTN, HLD    Patient Stated Goals  He wants to use prosthesis so can walk & no longer needs his kids to transport & assist.     Currently in Pain?  No/denies    Pain Score  0-No pain         [facility] Adult PT Treatment/Exercise - 08/22/18 0938      Transfers   Transfers  Sit to Stand;Stand to Sit    Sit to Stand  6: Modified independent (Device/Increase time);With upper extremity assist;With armrests;From chair/3-in-1    Stand to Sit  6: Modified independent (Device/Increase time);With upper extremity assist;With armrests;To chair/3-in-1      Ambulation/Gait   Ambulation/Gait  Yes    Ambulation/Gait Assistance  5: Supervision;4: Min guard;4: Min assist    Ambulation/Gait Assistance Details  supervision with cane; min guard to min assist for balance with gait with prosthesis only with cues to increase her base of support.      Ambulation Distance (Feet)  230 Feet   x1 cane; 120 x1 no AD   Assistive device  Straight cane;Prosthesis;None    Gait Pattern  Step-through pattern;Decreased stance time - right;Decreased weight shift to left;Trunk flexed;Left flexed knee in stance    Ambulation Surface  Level;Indoor      High Level Balance   High Level Balance Activities  Side stepping;Marching forwards;Marching backwards    High Level Balance Comments  at counter top: side stepping x 3 laps each way with min guard assist, cues to lift foot each time. fwd/bwd marching x 3 laps each way with loss of balance on 3rd rep backwards needing up to mod assist to correct.       Knee/Hip Exercises: Standing   Hip Abduction  AROM;Stengthening;Both;1 set;10 reps;Knee straight;Limitations    Abduction Limitations  with blue band resistance and UE support: alternating LE's with cues on technique/form    Hip Extension  AROM;Stengthening;Both;1 set;10 reps;Knee  straight;Limitations    Extension Limitations  with blue band resistance with UE support: alternating LE"s with cues on technique/form    Forward Step Up  Both;10 reps;Hand Hold: 2;Step Height: 6";Limitations      Knee/Hip Exercises: Seated   Clamshell with TheraBand  --   black x 10 reps with 5 sec holds.    Marching  AROM;Strengthening;Both;1 set;10 reps;Limitations    Marching Limitations  with black band, alternating LE's with cues on technique.       Prosthetics   Prosthetic Care Comments   small area of heat rash on uppert thigh/next to knee. applied baby oil with education to pt/spouse on how to use this at home.  Current prosthetic wear tolerance (days/week)   daily    Current prosthetic wear tolerance (#hours/day)   wearing all awake hours    Residual limb condition   intact per pt report, scab still present    Education Provided  Residual limb care;Proper weight-bearing schedule/adjustment    Person(s) Educated  Patient;Spouse    Education Method  Explanation;Demonstration;Verbal cues    Education Method  Verbalized understanding;Returned demonstration;Verbal cues required;Needs further instruction    Donning Prosthesis  Modified independent (device/increased time)    Doffing Prosthesis  Modified independent (device/increased time)               PT Short Term Goals - 08/01/18 1026      PT SHORT TERM GOAL #1   Title  Patient verbalizes adjusting ply socks with limb volume changes. (All STGs updated Target Date: 08/05/2018)    Baseline  MET 08/01/2018    Time  1    Period  Months    Status  Achieved      PT SHORT TERM GOAL #2   Title  Berg Balance >36/56    Baseline  MET 08/01/2018  Merrilee Jansky 36/56    Time  1    Period  Months    Status  Achieved      PT SHORT TERM GOAL #3   Title  Patient tolerates wear >12hrs total/day.     Baseline  MET 08/01/2018    Time  1    Period  Months    Status  Achieved      PT SHORT TERM GOAL #4   Title  Patient ambulates  200' with cane & prosthesis with minA.     Baseline  MET 08/01/2018    Time  1    Period  Months    Status  Achieved      PT SHORT TERM GOAL #5   Title  Patient negotiates ramps & curbs with cane with minA.     Baseline  MET 08/01/2018    Time  1    Period  Months    Status  Achieved        PT Long Term Goals - 07/11/18 5638      PT LONG TERM GOAL #1   Title  Pt will be independent with HEP along with prosthetic care in order to indicate improved functional mobility and safety with prosthesis.  (Target Date: 09/05/17)    Time  12    Period  Weeks    Status  On-going    Target Date  09/05/17      PT LONG TERM GOAL #2   Title  Pt will tolerate wearing prosthesis >/=90% of awake hours without negative skin complications.     Time  12    Period  Weeks    Status  On-going    Target Date  09/05/17      PT LONG TERM GOAL #3   Title  Pt will ambulate w/ LRAD >500' over indoor and unlevel paved outdoor surfaces at mod I level in order to indicate safe home and limited community negotiation.     Time  12    Period  Weeks    Status  On-going    Target Date  09/05/17      PT LONG TERM GOAL #4   Title  Pt will improve BERG balance score to >/=45/56 in order to indicate decreased fall risk.     Time  12    Period  Weeks  Status  Revised      PT LONG TERM GOAL #5   Title  Patient negotiates ramps, curbs & stairs with LRAD & prosthesis modified Independent.     Time  12    Period  Weeks    Status  New    Target Date  09/05/17            Plan - 08/22/18 3893    Clinical Impression Statement  Today's skilled session continued to focus on gait with cane vs no AD, balance and strengthening. The pt is making steady progress toward goals and should benefit from continued PT to progress toward unmet goals.     Clinical Impairments Affecting Rehab Potential  Pt motivated to progress and become as independent as possible, limited by number of co-morbidities    PT Frequency  2x /  week    PT Duration  12 weeks    PT Treatment/Interventions  ADLs/Self Care Home Management;DME Instruction;Gait training;Stair training;Functional mobility training;Therapeutic activities;Therapeutic exercise;Balance training;Neuromuscular re-education;Prosthetic Training;Passive range of motion;Energy conservation;Vestibular    PT Next Visit Plan  work towards [place],  Continue gait training with straight cane for community & no device for household, balance training, and BLE strengthening esp hip strength.    Consulted and Agree with Plan of Care  Patient;Family member/caregiver    Family Member Consulted  wife       Patient will benefit from skilled therapeutic intervention in order to improve the following deficits and impairments:  Abnormal gait, Decreased activity tolerance, Decreased balance, Decreased endurance, Decreased knowledge of precautions, Decreased knowledge of use of DME, Decreased mobility, Decreased strength, Difficulty walking, Impaired perceived functional ability, Impaired flexibility, Impaired sensation, Prosthetic Dependency, Postural dysfunction  Visit Diagnosis: Other abnormalities of gait and mobility  Unsteadiness on feet  Muscle weakness (generalized)     Problem List Patient Active Problem List   Diagnosis Date Noted  . Hx of BKA, left ([place]) 01/13/2018  . Nausea and vomiting in adult 12/26/2017  . Pressure injury of skin 12/23/2017  . C. difficile colitis 12/22/2017  . UTI (urinary tract infection) 12/22/2017  . Hyponatremia 12/22/2017  . Normocytic anemia 12/22/2017  . Chronic anticoagulation 09/26/2015  . Persistent atrial fibrillation 09/26/2015  . PVD (peripheral vascular disease) ([place]) 06/02/2012  . BPH (benign prostatic hyperplasia) 11/16/2011  . AKI (acute kidney injury) ([place]) 08/26/2011  . Coronary artery disease 03/25/2011  . OBSTRUCTIVE SLEEP APNEA 09/15/2010  . PERIODIC LIMB MOVEMENT DISORDER 09/15/2010  . PERSONAL HISTORY OF COLONIC  POLYPS 10/25/2009  . VITAMIN B12 DEFICIENCY 03/04/2009  . HYPERTRIGLYCERIDEMIA 11/24/2006  . Essential hypertension 11/24/2006    Willow Ora, PTA, [facility] [address] [place], [place] [address] [phone] 08/22/18, 12:08 PM   Name: Devun Anna. MRN: [id number] Date of Birth: 1928-01-20

## 2018-08-25 ENCOUNTER — Encounter: Payer: Self-pay | Admitting: Physical Therapy

## 2018-08-25 ENCOUNTER — Ambulatory Visit: Payer: PPO | Admitting: Physical Therapy

## 2018-08-25 DIAGNOSIS — R2689 Other abnormalities of gait and mobility: Secondary | ICD-10-CM

## 2018-08-25 DIAGNOSIS — R293 Abnormal posture: Secondary | ICD-10-CM

## 2018-08-25 DIAGNOSIS — R2681 Unsteadiness on feet: Secondary | ICD-10-CM

## 2018-08-25 DIAGNOSIS — M6281 Muscle weakness (generalized): Secondary | ICD-10-CM

## 2018-08-25 NOTE — Therapy (Signed)
[facility] [address] [place], [place], [address] Phone: [phone]   Fax:  [phone]  Physical Therapy Treatment  Patient Details  Name: Eric [name]. MRN: [id number] Date of Birth: 1928/05/21 Referring Provider (PT): Leotis Pain, MD   Encounter Date: 08/25/2018  PT End of Session - 08/25/18 2045    Visit Number  22    Number of Visits  25    Date for PT Re-Evaluation  09/05/18    Authorization Type  [employer]-Needs 10th visit PN    PT Start Time  0930    PT Stop Time  1015    PT Time Calculation (min)  45 min    Equipment Utilized During Treatment  Gait belt    Activity Tolerance  Patient tolerated treatment well;No increased pain    Behavior During Therapy  WFL for tasks assessed/performed       Past Medical History:  Diagnosis Date  . Atrial flutter ([place])    s/p RFCA  . CAD (coronary artery disease)    cath 2003, occluded S-RCA, occluded S-Dx, L-LAD ok, s/p PTCA to LAD  . Cataract   . CKD (chronic kidney disease) stage 3, GFR 30-59 ml/min (HCC)   . Diabetes mellitus    diet controlled  . Hearing aid worn    bilateral  . History of kidney stones   . Long term (current) use of anticoagulants   . Neuromuscular disorder ([place])   . OSA (obstructive sleep apnea) 12/11   very mild, AHI 7/hr  . Persistent atrial fibrillation 09/26/2015  . Pure hyperglyceridemia   . PVD (peripheral vascular disease) ([place])    angioplasty of his right lower extremity in [place] by Dr.[name] 2013  . Unspecified essential hypertension   . Wears dentures    full upper and lower    Past Surgical History:  Procedure Laterality Date  . ABDOMINAL AORTOGRAM W/LOWER EXTREMITY N/A 11/15/2017   Procedure: ABDOMINAL AORTOGRAM W/LOWER EXTREMITY;  Surgeon: [name], MD;  Location: [facility];  Service: Cardiovascular;  Laterality: N/A;  . Adenosine Myoview  3/06   EF 56%, neg. Ischemia  . Adenosine  Myoview  02/18/07   nml  . AMPUTATION Left 12/15/2017   Procedure: AMPUTATION BELOW KNEE;  Surgeon: Algernon [name], MD;  Location: [facility];  Service: Vascular;  Laterality: Left;  . ANGIOPLASTY  1/99   CAD- diogonal with rotational artherectomy  . Arthrectomy     of LAD & PTCA  . BLEPHAROPLASTY Bilateral   . CARDIAC CATHETERIZATION  1/00  . CARDIOVERSION  1/04  . CARDIOVERSION  5/07   hospital- a flutter  . CARPAL TUNNEL RELEASE     ? bilateral  . CATARACT EXTRACTION W/PHACO Right 07/28/2017   Procedure: CATARACT EXTRACTION PHACO AND INTRAOCULAR LENS PLACEMENT ([place]) RIGHT DIABETIC;  Surgeon: [name], MD;  Location: [facility];  Service: Ophthalmology;  Laterality: Right;  Diabetic - diet controlled  . CATARACT EXTRACTION W/PHACO Left 08/18/2017   Procedure: CATARACT EXTRACTION PHACO AND INTRAOCULAR LENS PLACEMENT ([facility]);  Surgeon: [name], MD;  Location: [facility];  Service: Ophthalmology;  Laterality: Left;  DIABETES - oral meds  . COLONOSCOPY W/ BIOPSIES  10/01/06   sigmoid polyp bx neg, 3 years  . CORONARY ANGIOPLASTY  4/03   cutting balloon PTCA pLAD into Diag  . CORONARY ARTERY BYPASS GRAFT  2000   LIMA-LAD, SVG-RCA, SVG-Diag; SVG-Diag & SVG-RCA occluded 2003  . FRACTURE SURGERY    .  HAND SURGERY  08/27/09   R thumb procedure wit Scaphoid Gragt and screws, Dr [name]  . NM MYOVIEW LTD  4/11   normal  . PERIPHERAL VASCULAR CATHETERIZATION Right 06/27/2015   Procedure: Lower Extremity Angiography;  Surgeon: Algernon [name], MD;  Location: [facility];  Service: Cardiovascular;  Laterality: Right;  . PERIPHERAL VASCULAR CATHETERIZATION  06/27/2015   Procedure: Lower Extremity Intervention;  Surgeon: Algernon [name], MD;  Location: [facility];  Service: Cardiovascular;;    There were no vitals filed for this visit.  Subjective Assessment - 08/25/18 0931    Subjective  No falls. He uses stick to walk outside.     Patient is  accompained by:  Family member    Pertinent History  L TTA, CAD, A-Fib, CKD3, DM2, PAD, HTN, HLD    Patient Stated Goals  He wants to use prosthesis so can walk & no longer needs his kids to transport & assist.     Currently in Pain?  No/denies                       [facility] Adult PT Treatment/Exercise - 08/25/18 0930      Transfers   Transfers  Sit to Stand;Stand to Sit    Sit to Stand  6: Modified independent (Device/Increase time);With upper extremity assist;With armrests;From chair/3-in-1    Stand to Sit  6: Modified independent (Device/Increase time);With upper extremity assist;With armrests;To chair/3-in-1      Ambulation/Gait   Ambulation/Gait  Yes    Ambulation/Gait Assistance  5: Supervision    Ambulation/Gait Assistance Details  Verbal & tactile cues gait household with prosthesis only, limited community level with cane, walking in yard with walking stick and using his rollator for long distances. He reports that he likes to attend [employer] in [place]. PT recommended using rollator so can rest on seat.     Ambulation Distance (Feet)  400 Feet   100' prosthesis only, 400' cane, outdoors with walking stick   Assistive device  Straight cane;Prosthesis;None;Other (Comment)   Walking Stick for outdoors   Gait Pattern  Step-through pattern;Decreased stance time - right;Decreased weight shift to left;Trunk flexed;Left flexed knee in stance    Ambulation Surface  Indoor;Level;Outdoor;Paved;Gravel;Grass    Ramp  5: Supervision   cane & prosthesis   Curb  5: Supervision   cane & prosthesis     High Level Balance   High Level Balance Activities  Turns;Head turns;Negotitating around obstacles;Negotiating over obstacles   cane & prosthesis   High Level Balance Comments  verbal & tactile cues on technique & wt shift onto prosthesis      Knee/Hip Exercises: Standing   Hip Abduction  --    Abduction Limitations  --    Hip Extension  --    Extension  Limitations  --    Forward Step Up  --      Knee/Hip Exercises: Seated   Clamshell with TheraBand  --    Marching  --    Marching Limitations  --      Prosthetics   Prosthetic Care Comments   signs of sweating & need to dry limb/liner to prevent skin issues. Follow up with prosthetist after PT discharge and call prosthetist if issue arises between visits.     Current prosthetic wear tolerance (days/week)   daily    Current prosthetic wear tolerance (#hours/day)   wearing all awake hours    Residual  limb condition   intact, sweaty at distal limb    Education Provided  Residual limb care;Other (comment)   see prosthetic care comments   Person(s) Educated  Patient;Spouse    Education Method  Explanation;Demonstration;Verbal cues    Education Method  Verbalized understanding               PT Short Term Goals - 08/01/18 1026      PT SHORT TERM GOAL #1   Title  Patient verbalizes adjusting ply socks with limb volume changes. (All STGs updated Target Date: 08/05/2018)    Baseline  MET 08/01/2018    Time  1    Period  Months    Status  Achieved      PT SHORT TERM GOAL #2   Title  Berg Balance >36/56    Baseline  MET 08/01/2018  Merrilee Jansky 36/56    Time  1    Period  Months    Status  Achieved      PT SHORT TERM GOAL #3   Title  Patient tolerates wear >12hrs total/day.     Baseline  MET 08/01/2018    Time  1    Period  Months    Status  Achieved      PT SHORT TERM GOAL #4   Title  Patient ambulates 200' with cane & prosthesis with minA.     Baseline  MET 08/01/2018    Time  1    Period  Months    Status  Achieved      PT SHORT TERM GOAL #5   Title  Patient negotiates ramps & curbs with cane with minA.     Baseline  MET 08/01/2018    Time  1    Period  Months    Status  Achieved        PT Long Term Goals - 07/11/18 3235      PT LONG TERM GOAL #1   Title  Pt will be independent with HEP along with prosthetic care in order to indicate improved functional mobility  and safety with prosthesis.  (Target Date: 09/05/17)    Time  12    Period  Weeks    Status  On-going    Target Date  09/05/17      PT LONG TERM GOAL #2   Title  Pt will tolerate wearing prosthesis >/=90% of awake hours without negative skin complications.     Time  12    Period  Weeks    Status  On-going    Target Date  09/05/17      PT LONG TERM GOAL #3   Title  Pt will ambulate w/ LRAD >500' over indoor and unlevel paved outdoor surfaces at mod I level in order to indicate safe home and limited community negotiation.     Time  12    Period  Weeks    Status  On-going    Target Date  09/05/17      PT LONG TERM GOAL #4   Title  Pt will improve BERG balance score to >/=45/56 in order to indicate decreased fall risk.     Time  12    Period  Weeks    Status  Revised      PT LONG TERM GOAL #5   Title  Patient negotiates ramps, curbs & stairs with LRAD & prosthesis modified Independent.     Time  12    Period  Weeks    Status  New    Target Date  09/05/17            Plan - 08/25/18 2100    Clinical Impression Statement  Today's skilled session focused on instruction in optimal assistive device for various gait activities. Patient appears on target to meet LTGs next week.     Clinical Impairments Affecting Rehab Potential  Pt motivated to progress and become as independent as possible, limited by number of co-morbidities    PT Frequency  2x / week    PT Duration  12 weeks    PT Treatment/Interventions  ADLs/Self Care Home Management;DME Instruction;Gait training;Stair training;Functional mobility training;Therapeutic activities;Therapeutic exercise;Balance training;Neuromuscular re-education;Prosthetic Training;Passive range of motion;Energy conservation;Vestibular    PT Next Visit Plan  begin to assess LTGs with plan to discharge next week, Continue gait training with straight cane for community & no device for household, balance training    Consulted and Agree with Plan of  Care  Patient;Family member/caregiver    Family Member Consulted  wife       Patient will benefit from skilled therapeutic intervention in order to improve the following deficits and impairments:  Abnormal gait, Decreased activity tolerance, Decreased balance, Decreased endurance, Decreased knowledge of precautions, Decreased knowledge of use of DME, Decreased mobility, Decreased strength, Difficulty walking, Impaired perceived functional ability, Impaired flexibility, Impaired sensation, Prosthetic Dependency, Postural dysfunction  Visit Diagnosis: Other abnormalities of gait and mobility  Unsteadiness on feet  Muscle weakness (generalized)  Abnormal posture     Problem List Patient Active Problem List   Diagnosis Date Noted  . Hx of BKA, left ([place]) 01/13/2018  . Nausea and vomiting in adult 12/26/2017  . Pressure injury of skin 12/23/2017  . C. difficile colitis 12/22/2017  . UTI (urinary tract infection) 12/22/2017  . Hyponatremia 12/22/2017  . Normocytic anemia 12/22/2017  . Chronic anticoagulation 09/26/2015  . Persistent atrial fibrillation 09/26/2015  . PVD (peripheral vascular disease) ([place]) 06/02/2012  . BPH (benign prostatic hyperplasia) 11/16/2011  . AKI (acute kidney injury) ([place]) 08/26/2011  . Coronary artery disease 03/25/2011  . OBSTRUCTIVE SLEEP APNEA 09/15/2010  . PERIODIC LIMB MOVEMENT DISORDER 09/15/2010  . PERSONAL HISTORY OF COLONIC POLYPS 10/25/2009  . VITAMIN B12 DEFICIENCY 03/04/2009  . HYPERTRIGLYCERIDEMIA 11/24/2006  . Essential hypertension 11/24/2006    Jamey Reas PT, DPT 08/25/2018, 9:03 PM  [facility] [address] [place], [place], [address] Phone: [phone]   Fax:  [phone]  Name: Eric [name]. MRN: [id number] Date of Birth: 02/15/1928

## 2018-08-26 DIAGNOSIS — S88112D Complete traumatic amputation at level between knee and ankle, left lower leg, subsequent encounter: Secondary | ICD-10-CM | POA: Diagnosis not present

## 2018-08-28 DIAGNOSIS — S88112D Complete traumatic amputation at level between knee and ankle, left lower leg, subsequent encounter: Secondary | ICD-10-CM | POA: Diagnosis not present

## 2018-08-28 DIAGNOSIS — E11621 Type 2 diabetes mellitus with foot ulcer: Secondary | ICD-10-CM | POA: Diagnosis not present

## 2018-08-29 ENCOUNTER — Encounter: Payer: Self-pay | Admitting: Rehabilitation

## 2018-08-29 ENCOUNTER — Ambulatory Visit: Payer: PPO | Admitting: Rehabilitation

## 2018-08-29 DIAGNOSIS — R2689 Other abnormalities of gait and mobility: Secondary | ICD-10-CM

## 2018-08-29 DIAGNOSIS — M6281 Muscle weakness (generalized): Secondary | ICD-10-CM

## 2018-08-29 DIAGNOSIS — R293 Abnormal posture: Secondary | ICD-10-CM

## 2018-08-29 DIAGNOSIS — R2681 Unsteadiness on feet: Secondary | ICD-10-CM

## 2018-08-29 NOTE — Therapy (Signed)
[facility] [address] [place], [place], [address] Phone: [phone]   Fax:  [phone]  Physical Therapy Treatment  Patient Details  Name: Eric [name]. MRN: [id number] Date of Birth: [birth year] Referring Provider (PT): Leotis Pain, MD   Encounter Date: 08/29/2018  PT End of Session - 08/29/18 1212    Visit Number  23    Number of Visits  25    Date for PT Re-Evaluation  09/05/18    Authorization Type  [employer]-Needs 10th visit PN    PT Start Time  0930    PT Stop Time  1010    PT Time Calculation (min)  40 min    Equipment Utilized During Treatment  Gait belt    Activity Tolerance  Patient tolerated treatment well;No increased pain    Behavior During Therapy  WFL for tasks assessed/performed       Past Medical History:  Diagnosis Date  . Atrial flutter ([place])    s/p RFCA  . CAD (coronary artery disease)    cath 2003, occluded S-RCA, occluded S-Dx, L-LAD ok, s/p PTCA to LAD  . Cataract   . CKD (chronic kidney disease) stage 3, GFR 30-59 ml/min (HCC)   . Diabetes mellitus    diet controlled  . Hearing aid worn    bilateral  . History of kidney stones   . Long term (current) use of anticoagulants   . Neuromuscular disorder ([place])   . OSA (obstructive sleep apnea) 12/11   very mild, AHI 7/hr  . Persistent atrial fibrillation 09/26/2015  . Pure hyperglyceridemia   . PVD (peripheral vascular disease) ([place])    angioplasty of his right lower extremity in [place] by Dr.[name] 2013  . Unspecified essential hypertension   . Wears dentures    full upper and lower    Past Surgical History:  Procedure Laterality Date  . ABDOMINAL AORTOGRAM W/LOWER EXTREMITY N/A 11/15/2017   Procedure: ABDOMINAL AORTOGRAM W/LOWER EXTREMITY;  Surgeon: [name], MD;  Location: [facility];  Service: Cardiovascular;  Laterality: N/A;  . Adenosine Myoview  3/06   EF 56%, neg. Ischemia  . Adenosine  Myoview  02/18/07   nml  . AMPUTATION Left 12/15/2017   Procedure: AMPUTATION BELOW KNEE;  Surgeon: Algernon [name], MD;  Location: [facility];  Service: Vascular;  Laterality: Left;  . ANGIOPLASTY  1/99   CAD- diogonal with rotational artherectomy  . Arthrectomy     of LAD & PTCA  . BLEPHAROPLASTY Bilateral   . CARDIAC CATHETERIZATION  1/00  . CARDIOVERSION  1/04  . CARDIOVERSION  5/07   hospital- a flutter  . CARPAL TUNNEL RELEASE     ? bilateral  . CATARACT EXTRACTION W/PHACO Right 07/28/2017   Procedure: CATARACT EXTRACTION PHACO AND INTRAOCULAR LENS PLACEMENT ([place]) RIGHT DIABETIC;  Surgeon: [name], MD;  Location: [facility];  Service: Ophthalmology;  Laterality: Right;  Diabetic - diet controlled  . CATARACT EXTRACTION W/PHACO Left 08/18/2017   Procedure: CATARACT EXTRACTION PHACO AND INTRAOCULAR LENS PLACEMENT ([facility]);  Surgeon: [name], MD;  Location: [facility];  Service: Ophthalmology;  Laterality: Left;  DIABETES - oral meds  . COLONOSCOPY W/ BIOPSIES  10/01/06   sigmoid polyp bx neg, 3 years  . CORONARY ANGIOPLASTY  4/03   cutting balloon PTCA pLAD into Diag  . CORONARY ARTERY BYPASS GRAFT  2000   LIMA-LAD, SVG-RCA, SVG-Diag; SVG-Diag & SVG-RCA occluded 2003  . FRACTURE SURGERY    .  HAND SURGERY  08/27/09   R thumb procedure wit Scaphoid Gragt and screws, Dr [name]  . NM MYOVIEW LTD  4/11   normal  . PERIPHERAL VASCULAR CATHETERIZATION Right 06/27/2015   Procedure: Lower Extremity Angiography;  Surgeon: Algernon [name], MD;  Location: [facility];  Service: Cardiovascular;  Laterality: Right;  . PERIPHERAL VASCULAR CATHETERIZATION  06/27/2015   Procedure: Lower Extremity Intervention;  Surgeon: Algernon [name], MD;  Location: [facility];  Service: Cardiovascular;;    There were no vitals filed for this visit.  Subjective Assessment - 08/29/18 0923    Subjective  No changes since last visit.     Pertinent History  L TTA, CAD,  A-Fib, CKD3, DM2, PAD, HTN, HLD    Patient Stated Goals  He wants to use prosthesis so can walk & no longer needs his kids to transport & assist.     Currently in Pain?  No/denies                       [facility] Adult PT Treatment/Exercise - 08/29/18 0924      Ambulation/Gait   Ambulation/Gait  Yes    Ambulation/Gait Assistance  4: Min guard    Ambulation/Gait Assistance Details  Pt performed short distances during session without AD (prosthesis only) at close S to min/guard level.  Pt intially needing more assist, but as he continued progressed to close S level.      Ambulation Distance (Feet)  20 Feet   x 2, then 30 x 1    Assistive device  None    Gait Pattern  Step-through pattern;Decreased stance time - right;Decreased weight shift to left;Trunk flexed;Left flexed knee in stance    Ambulation Surface  Level;Indoor      Standardized Balance Assessment   Standardized Balance Assessment  Berg Balance Test      Berg Balance Test   Sit to Stand  Able to stand without using hands and stabilize independently    Standing Unsupported  Able to stand safely 2 minutes    Sitting with Back Unsupported but Feet Supported on Floor or Stool  Able to sit safely and securely 2 minutes    Stand to Sit  Sits safely with minimal use of hands    Transfers  Able to transfer safely, minor use of hands    Standing Unsupported with Eyes Closed  Able to stand 10 seconds with supervision    Standing Ubsupported with Feet Together  Able to place feet together independently and stand 1 minute safely    From Standing, Reach Forward with Outstretched Arm  Can reach forward >12 cm safely (5")    From Standing Position, Pick up Object from Floor  Able to pick up shoe safely and easily    From Standing Position, Turn to Look Behind Over each Shoulder  Looks behind one side only/other side shows less weight shift    Turn 360 Degrees  Needs close supervision or verbal cueing    Standing Unsupported,  Alternately Place Feet on Step/Stool  Able to complete >2 steps/needs minimal assist    Standing Unsupported, One Foot in Front  Able to take small step independently and hold 30 seconds    Standing on One Leg  Tries to lift leg/unable to hold 3 seconds but remains standing independently    Total Score  42      Neuro Re-ed    Neuro Re-ed Details   Due to  deficits noted during BERG balance and difficulties pt reports at home, performed several balance tasks and added to HEP today.  Performed staggered stance with LLE posterior to RLE maintaining balance x 2 sets of 20 secs, tandem gait along // bars for light single UE support x 2 reps.  Added this to HEP along counter top.  See pt instruction for details.  Also attempted standing on foam airex with feet apart EC. This was extremely difficult for pt, therefore transitioned back to solid ground feet apart EC x 2 reps of 20 secs.  See pt instructions.         Prosthetic training: Performed stairs x 2 reps during session with single/intermittent double UE support in reciprocal pattern for improved LLE weight bearing/weight shift.  Note that he continues to have more difficulty when leading with RLE as he reports this is weaker leg.         PT Short Term Goals - 08/01/18 1026      PT SHORT TERM GOAL #1   Title  Patient verbalizes adjusting ply socks with limb volume changes. (All STGs updated Target Date: 08/05/2018)    Baseline  MET 08/01/2018    Time  1    Period  Months    Status  Achieved      PT SHORT TERM GOAL #2   Title  Berg Balance >36/56    Baseline  MET 08/01/2018  Merrilee Jansky 36/56    Time  1    Period  Months    Status  Achieved      PT SHORT TERM GOAL #3   Title  Patient tolerates wear >12hrs total/day.     Baseline  MET 08/01/2018    Time  1    Period  Months    Status  Achieved      PT SHORT TERM GOAL #4   Title  Patient ambulates 200' with cane & prosthesis with minA.     Baseline  MET 08/01/2018    Time  1    Period   Months    Status  Achieved      PT SHORT TERM GOAL #5   Title  Patient negotiates ramps & curbs with cane with minA.     Baseline  MET 08/01/2018    Time  1    Period  Months    Status  Achieved        PT Long Term Goals - 08/29/18 0949      PT LONG TERM GOAL #1   Title  Pt will be independent with HEP along with prosthetic care in order to indicate improved functional mobility and safety with prosthesis.  (Target Date: 09/05/17)    Time  12    Period  Weeks    Status  On-going      PT LONG TERM GOAL #2   Title  Pt will tolerate wearing prosthesis >/=90% of awake hours without negative skin complications.     Time  12    Period  Weeks    Status  On-going      PT LONG TERM GOAL #3   Title  Pt will ambulate w/ LRAD >500' over indoor and unlevel paved outdoor surfaces at mod I level in order to indicate safe home and limited community negotiation.     Time  12    Period  Weeks    Status  On-going      PT LONG TERM GOAL #4   Title  Pt will improve BERG balance score to >/=45/56 in order to indicate decreased fall risk.     Baseline  42/56 on 08/29/18 (improved from 36 at STG)    Time  12    Period  Weeks    Status  Partially Met      PT LONG TERM GOAL #5   Title  Patient negotiates ramps, curbs & stairs with LRAD & prosthesis modified Independent.     Time  12    Period  Weeks    Status  New            Plan - 08/29/18 1209    Clinical Impression Statement  Skilled session began to look at [place] for DC at next session.  Pt has improved BERG from 36/56 to 42/56, however did not quite meet goal for 45/56.  Based on deficits during testing, provided exercises to HEP to address standing balance.      Clinical Impairments Affecting Rehab Potential  Pt motivated to progress and become as independent as possible, limited by number of co-morbidities    PT Frequency  2x / week    PT Duration  12 weeks    PT Treatment/Interventions  ADLs/Self Care Home Management;DME  Instruction;Gait training;Stair training;Functional mobility training;Therapeutic activities;Therapeutic exercise;Balance training;Neuromuscular re-education;Prosthetic Training;Passive range of motion;Energy conservation;Vestibular    PT Next Visit Plan  Fix his HEP to forward tandem instead of backwards. check remaining LTGs and D/C     Consulted and Agree with Plan of Care  Patient;Family member/caregiver    Family Member Consulted  wife       Patient will benefit from skilled therapeutic intervention in order to improve the following deficits and impairments:  Abnormal gait, Decreased activity tolerance, Decreased balance, Decreased endurance, Decreased knowledge of precautions, Decreased knowledge of use of DME, Decreased mobility, Decreased strength, Difficulty walking, Impaired perceived functional ability, Impaired flexibility, Impaired sensation, Prosthetic Dependency, Postural dysfunction  Visit Diagnosis: Other abnormalities of gait and mobility  Unsteadiness on feet  Abnormal posture  Muscle weakness (generalized)     Problem List Patient Active Problem List   Diagnosis Date Noted  . Hx of BKA, left ([place]) 01/13/2018  . Nausea and vomiting in adult 12/26/2017  . Pressure injury of skin 12/23/2017  . C. difficile colitis 12/22/2017  . UTI (urinary tract infection) 12/22/2017  . Hyponatremia 12/22/2017  . Normocytic anemia 12/22/2017  . Chronic anticoagulation 09/26/2015  . Persistent atrial fibrillation 09/26/2015  . PVD (peripheral vascular disease) ([place]) 06/02/2012  . BPH (benign prostatic hyperplasia) 11/16/2011  . AKI (acute kidney injury) ([place]) 08/26/2011  . Coronary artery disease 03/25/2011  . OBSTRUCTIVE SLEEP APNEA 09/15/2010  . PERIODIC LIMB MOVEMENT DISORDER 09/15/2010  . PERSONAL HISTORY OF COLONIC POLYPS 10/25/2009  . VITAMIN B12 DEFICIENCY 03/04/2009  . HYPERTRIGLYCERIDEMIA 11/24/2006  . Essential hypertension 11/24/2006    Cameron Sprang, PT,  MPT [facility] [address] [place], [place], [address] Phone: [phone]   Fax:  [phone] 08/29/18, 12:16 PM  Name: Eric [name]. MRN: [id number] Date of Birth: [birth year]

## 2018-08-29 NOTE — Patient Instructions (Signed)
Access Code: [email or web address]  URL: [email or web address]  Date: 08/29/2018  Prepared by: Cameron Sprang   Exercises  Backward Tandem Walking with Counter Support - 10 reps - 3 sets - 1x daily - 7x weekly  Staggered Stance Forward Backward Weight Shift with Unilateral Counter Support - 3 reps - 1 sets - 20 hold - 1x daily - 7x weekly  Wide Stance with Eyes Closed - 3 reps - 1 sets - 15 hold - 1x daily - 7x weekly

## 2018-09-01 ENCOUNTER — Ambulatory Visit: Payer: PPO | Admitting: Rehabilitation

## 2018-09-01 ENCOUNTER — Encounter: Payer: Self-pay | Admitting: Rehabilitation

## 2018-09-01 DIAGNOSIS — R2681 Unsteadiness on feet: Secondary | ICD-10-CM

## 2018-09-01 DIAGNOSIS — M6281 Muscle weakness (generalized): Secondary | ICD-10-CM

## 2018-09-01 DIAGNOSIS — R2689 Other abnormalities of gait and mobility: Secondary | ICD-10-CM

## 2018-09-01 DIAGNOSIS — R293 Abnormal posture: Secondary | ICD-10-CM

## 2018-09-01 NOTE — Therapy (Signed)
[facility] [address] [place], [place], [address] Phone: [phone]   Fax:  [phone]  Physical Therapy Treatment and D/C Summary   Patient Details  Name: Eric [name]. MRN: [id number] Date of Birth: 03/13/1928 Referring Provider (PT): Leotis Pain, MD   Encounter Date: 09/01/2018  PT End of Session - 09/01/18 1030    Visit Number  24    Number of Visits  25    Date for PT Re-Evaluation  09/05/18    Authorization Type  [employer]-Needs 10th visit PN    PT Start Time  0935    PT Stop Time  1017    PT Time Calculation (min)  42 min    Equipment Utilized During Treatment  Gait belt    Activity Tolerance  Patient tolerated treatment well;No increased pain    Behavior During Therapy  WFL for tasks assessed/performed       Past Medical History:  Diagnosis Date  . Atrial flutter ([place])    s/p RFCA  . CAD (coronary artery disease)    cath 2003, occluded S-RCA, occluded S-Dx, L-LAD ok, s/p PTCA to LAD  . Cataract   . CKD (chronic kidney disease) stage 3, GFR 30-59 ml/min (HCC)   . Diabetes mellitus    diet controlled  . Hearing aid worn    bilateral  . History of kidney stones   . Long term (current) use of anticoagulants   . Neuromuscular disorder ([place])   . OSA (obstructive sleep apnea) 12/11   very mild, AHI 7/hr  . Persistent atrial fibrillation 09/26/2015  . Pure hyperglyceridemia   . PVD (peripheral vascular disease) ([place])    angioplasty of his right lower extremity in [place] by Dr.[name] 2013  . Unspecified essential hypertension   . Wears dentures    full upper and lower    Past Surgical History:  Procedure Laterality Date  . ABDOMINAL AORTOGRAM W/LOWER EXTREMITY N/A 11/15/2017   Procedure: ABDOMINAL AORTOGRAM W/LOWER EXTREMITY;  Surgeon: [name], MD;  Location: [facility];  Service: Cardiovascular;  Laterality: N/A;  . Adenosine Myoview  3/06   EF 56%, neg. Ischemia   . Adenosine Myoview  02/18/07   nml  . AMPUTATION Left 12/15/2017   Procedure: AMPUTATION BELOW KNEE;  Surgeon: Algernon [name], MD;  Location: [facility];  Service: Vascular;  Laterality: Left;  . ANGIOPLASTY  1/99   CAD- diogonal with rotational artherectomy  . Arthrectomy     of LAD & PTCA  . BLEPHAROPLASTY Bilateral   . CARDIAC CATHETERIZATION  1/00  . CARDIOVERSION  1/04  . CARDIOVERSION  5/07   hospital- a flutter  . CARPAL TUNNEL RELEASE     ? bilateral  . CATARACT EXTRACTION W/PHACO Right 07/28/2017   Procedure: CATARACT EXTRACTION PHACO AND INTRAOCULAR LENS PLACEMENT ([place]) RIGHT DIABETIC;  Surgeon: [name], MD;  Location: [facility];  Service: Ophthalmology;  Laterality: Right;  Diabetic - diet controlled  . CATARACT EXTRACTION W/PHACO Left 08/18/2017   Procedure: CATARACT EXTRACTION PHACO AND INTRAOCULAR LENS PLACEMENT ([facility]);  Surgeon: [name], MD;  Location: [facility];  Service: Ophthalmology;  Laterality: Left;  DIABETES - oral meds  . COLONOSCOPY W/ BIOPSIES  10/01/06   sigmoid polyp bx neg, 3 years  . CORONARY ANGIOPLASTY  4/03   cutting balloon PTCA pLAD into Diag  . CORONARY ARTERY BYPASS GRAFT  2000   LIMA-LAD, SVG-RCA, SVG-Diag; SVG-Diag & SVG-RCA occluded 2003  .  FRACTURE SURGERY    . HAND SURGERY  08/27/09   R thumb procedure wit Scaphoid Gragt and screws, Dr [name]  . NM MYOVIEW LTD  4/11   normal  . PERIPHERAL VASCULAR CATHETERIZATION Right 06/27/2015   Procedure: Lower Extremity Angiography;  Surgeon: Algernon [name], MD;  Location: [facility];  Service: Cardiovascular;  Laterality: Right;  . PERIPHERAL VASCULAR CATHETERIZATION  06/27/2015   Procedure: Lower Extremity Intervention;  Surgeon: Algernon [name], MD;  Location: [facility];  Service: Cardiovascular;;    There were no vitals filed for this visit.  Subjective Assessment - 09/01/18 0945    Subjective  Pt reports feeling ready some days and not on some  days (regarding D/C)    Patient is accompained by:  Family member    Pertinent History  L TTA, CAD, A-Fib, CKD3, DM2, PAD, HTN, HLD    Patient Stated Goals  He wants to use prosthesis so can walk & no longer needs his kids to transport & assist.     Currently in Pain?  No/denies                       [facility] Adult PT Treatment/Exercise - 09/01/18 0940      Ambulation/Gait   Ambulation/Gait  Yes    Ambulation/Gait Assistance  6: Modified independent (Device/Increase time);5: Supervision    Ambulation/Gait Assistance Details  Pt ambulatory with his SPC during session over indoor surfaces at mod I level.  Pt able to ambulate over unlevel paved outtdoor surfaces with SPC at mod I level as well.  He was able to do outdoor curb at mod I level, however note that when doing indoors (higher) needs S to min/guard to prevent LOB.  Pt ambulatory for short distances indoors without AD briefly during session.  Recommend S at this time for safety, esp when making turns.     Ambulation Distance (Feet)  600 Feet    Assistive device  Straight cane;None    Gait Pattern  Step-through pattern;Decreased stance time - right;Decreased weight shift to left;Trunk flexed;Left flexed knee in stance    Ambulation Surface  Level;Unlevel;Indoor;Outdoor;Paved    Stairs  Yes    Stairs Assistance  6: Modified independent (Device/Increase time)    Stair Management Technique  One rail Left;Forwards;Alternating pattern;With cane    Number of Stairs  4    Height of Stairs  6    Ramp  6: Modified independent (Device)   with SPC   Curb  6: Modified independent (Device/increase time);4: Min assist;5: Supervision    Curb Details (indicate cue type and reason)  Pt able to do outdoors at mod I to S level, however over indoor curb due to increased height, needs min A to prevent LOB.       High Level Balance   High Level Balance Comments  Reviewed corner balance standing with feet apart EC x 2 sets of 20 secs, added  feet more narrow with EO performing head turns up/down and side/side x 10 reps.  Also performed marching at counter top for strnegthening and balance.  See pt instruction for full HEP.              PT Education - 09/01/18 1025    Education Details  additions to HEP, returning to therapy if needed in a few months.     Person(s) Educated  Patient    Methods  Explanation;Demonstration;Handout    Comprehension  Verbalized understanding;Returned  demonstration       PT Short Term Goals - 08/01/18 1026      PT SHORT TERM GOAL #1   Title  Patient verbalizes adjusting ply socks with limb volume changes. (All STGs updated Target Date: 08/05/2018)    Baseline  MET 08/01/2018    Time  1    Period  Months    Status  Achieved      PT SHORT TERM GOAL #2   Title  Berg Balance >36/56    Baseline  MET 08/01/2018  Merrilee Jansky 36/56    Time  1    Period  Months    Status  Achieved      PT SHORT TERM GOAL #3   Title  Patient tolerates wear >12hrs total/day.     Baseline  MET 08/01/2018    Time  1    Period  Months    Status  Achieved      PT SHORT TERM GOAL #4   Title  Patient ambulates 200' with cane & prosthesis with minA.     Baseline  MET 08/01/2018    Time  1    Period  Months    Status  Achieved      PT SHORT TERM GOAL #5   Title  Patient negotiates ramps & curbs with cane with minA.     Baseline  MET 08/01/2018    Time  1    Period  Months    Status  Achieved        PT Long Term Goals - 09/01/18 0948      PT LONG TERM GOAL #1   Title  Pt will be independent with HEP along with prosthetic care in order to indicate improved functional mobility and safety with prosthesis.  (Target Date: 09/05/17)    Baseline  met, but not performing consistently     Time  12    Period  Weeks    Status  Achieved      PT LONG TERM GOAL #2   Title  Pt will tolerate wearing prosthesis >/=90% of awake hours without negative skin complications.     Time  12    Period  Weeks    Status  Achieved       PT LONG TERM GOAL #3   Title  Pt will ambulate w/ LRAD >500' over indoor and unlevel paved outdoor surfaces at mod I level in order to indicate safe home and limited community negotiation.     Baseline  met with SPC    Time  12    Period  Weeks    Status  Achieved      PT LONG TERM GOAL #4   Title  Pt will improve BERG balance score to >/=45/56 in order to indicate decreased fall risk.     Baseline  42/56 on 08/29/18 (improved from 36 at STG)    Time  12    Period  Weeks    Status  Partially Met      PT LONG TERM GOAL #5   Title  Patient negotiates ramps, curbs & stairs with LRAD & prosthesis modified Independent.     Baseline  S for ascending curb step, otherwise is mod I with his SPC    Time  12    Period  Weeks    Status  Partially Met         PHYSICAL THERAPY DISCHARGE SUMMARY  Visits from Start of Care: 24  Current functional  level related to goals / functional outcomes: See LTGs above   Remaining deficits: Pt continues to require use of SPC over outdoor surfaces and longer distances due to balance and endurance deficits. Has HEP to address along with walking program to improve endurance.    Education / Equipment: HEP   Plan: Patient agrees to discharge.  Patient goals were partially met. Patient is being discharged due to meeting the stated rehab goals.  ?????         Plan - 09/01/18 1030    Clinical Impression Statement  Skilled session assessed remaining LTGs and ensuring HEP is up to date and of moderate difficulty in order to continue gains at home.  Pt has met 3/5 LTGs and partially meeting stair/curb goal as well as BERG goal.  Educated that pt should work at home on these balance and strengthening exercises along with endurance, but can return if needing to work on higher level tasks or if there is a decline in function.  Pt verbalized understanding and is ready for D/C.     Clinical Impairments Affecting Rehab Potential  Pt motivated to progress and  become as independent as possible, limited by number of co-morbidities    PT Frequency  2x / week    PT Duration  12 weeks    PT Treatment/Interventions  ADLs/Self Care Home Management;DME Instruction;Gait training;Stair training;Functional mobility training;Therapeutic activities;Therapeutic exercise;Balance training;Neuromuscular re-education;Prosthetic Training;Passive range of motion;Energy conservation;Vestibular    PT Next Visit Plan  --    Consulted and Agree with Plan of Care  Patient;Family member/caregiver    Family Member Consulted  wife       Patient will benefit from skilled therapeutic intervention in order to improve the following deficits and impairments:  Abnormal gait, Decreased activity tolerance, Decreased balance, Decreased endurance, Decreased knowledge of precautions, Decreased knowledge of use of DME, Decreased mobility, Decreased strength, Difficulty walking, Impaired perceived functional ability, Impaired flexibility, Impaired sensation, Prosthetic Dependency, Postural dysfunction  Visit Diagnosis: Other abnormalities of gait and mobility  Unsteadiness on feet  Abnormal posture  Muscle weakness (generalized)     Problem List Patient Active Problem List   Diagnosis Date Noted  . Hx of BKA, left ([place]) 01/13/2018  . Nausea and vomiting in adult 12/26/2017  . Pressure injury of skin 12/23/2017  . C. difficile colitis 12/22/2017  . UTI (urinary tract infection) 12/22/2017  . Hyponatremia 12/22/2017  . Normocytic anemia 12/22/2017  . Chronic anticoagulation 09/26/2015  . Persistent atrial fibrillation 09/26/2015  . PVD (peripheral vascular disease) ([place]) 06/02/2012  . BPH (benign prostatic hyperplasia) 11/16/2011  . AKI (acute kidney injury) ([place]) 08/26/2011  . Coronary artery disease 03/25/2011  . OBSTRUCTIVE SLEEP APNEA 09/15/2010  . PERIODIC LIMB MOVEMENT DISORDER 09/15/2010  . PERSONAL HISTORY OF COLONIC POLYPS 10/25/2009  . VITAMIN B12 DEFICIENCY  03/04/2009  . HYPERTRIGLYCERIDEMIA 11/24/2006  . Essential hypertension 11/24/2006    Cameron Sprang, PT, MPT [facility] [address] [place], [place], [address] Phone: [phone]   Fax:  [phone] 09/01/18, 10:35 AM  Name: Eric [name]. MRN: [id number] Date of Birth: [birth year]

## 2018-09-01 NOTE — Patient Instructions (Signed)
Access Code: [email or web address]  URL: [email or web address]  Date: 09/01/2018  Prepared by: Cameron Sprang   Exercises  Backward Tandem Walking with Counter Support - 10 reps - 3 sets - 1x daily - 7x weekly  Staggered Stance Forward Backward Weight Shift with Unilateral Counter Support - 3 reps - 1 sets - 20 hold - 1x daily - 7x weekly  Wide Stance with Eyes Closed - 3 reps - 1 sets - 15 hold - 1x daily - 7x weekly  Standing Marching - 10 reps - 1 sets - 1-2x daily - 7x weekly  Romberg Stance with Head Rotation - 10 reps - 1 sets - 2x daily - 7x weekly  Romberg Stance with Head Nods on Foam Pad - 10 reps - 1 sets - 2x daily - 7x weekly

## 2018-09-05 ENCOUNTER — Encounter: Payer: PPO | Admitting: Physical Therapy

## 2018-09-08 ENCOUNTER — Encounter: Payer: PPO | Admitting: Rehabilitation

## 2018-09-13 ENCOUNTER — Encounter: Payer: Self-pay | Admitting: Family Medicine

## 2018-09-13 ENCOUNTER — Ambulatory Visit (INDEPENDENT_AMBULATORY_CARE_PROVIDER_SITE_OTHER): Payer: PPO | Admitting: Family Medicine

## 2018-09-13 VITALS — BP 110/60 | HR 84 | Temp 97.7°F | Resp 12 | Ht 65.0 in

## 2018-09-13 DIAGNOSIS — J40 Bronchitis, not specified as acute or chronic: Secondary | ICD-10-CM | POA: Diagnosis not present

## 2018-09-13 MED ORDER — DOXYCYCLINE HYCLATE 100 MG PO TABS: 100.0000 mg | ORAL_TABLET | Freq: Two times a day (BID) | ORAL | 0 refills | Status: DC

## 2018-09-13 NOTE — Progress Notes (Signed)
Subjective:    Patient ID: Eric [name]., male    DOB: 22-Mar-1928, 85 y.o.   MRN: [id number]  Patient is here today with his wife.  Is urgently worked in.  He has had a cough now for 2 weeks.  It is steadily worsening.  His wife has bronchitis.  Patient reports mild shortness of breath with activity.  He reports burning pain in both of his lungs with coughing.  He denies any fever.  However he does report chills.  He also reports weakness.  Weakness is steadily been worsening over the last week.  In fact he fell last week.  He denies any angina.  He denies any orthopnea.  He denies any swelling in his legs.  Pulmonary exam was performed and the patient has faint expiratory wheezes bilaterally and prominent rhonchi in his left lung. Past Medical History:  Diagnosis Date  . Atrial flutter ([place])    s/p RFCA  . CAD (coronary artery disease)    cath 2003, occluded S-RCA, occluded S-Dx, L-LAD ok, s/p PTCA to LAD  . Cataract   . CKD (chronic kidney disease) stage 3, GFR 30-59 ml/min (HCC)   . Diabetes mellitus    diet controlled  . Hearing aid worn    bilateral  . History of kidney stones   . Long term (current) use of anticoagulants   . Neuromuscular disorder ([place])   . OSA (obstructive sleep apnea) 12/11   very mild, AHI 7/hr  . Persistent atrial fibrillation 09/26/2015  . Pure hyperglyceridemia   . PVD (peripheral vascular disease) ([place])    angioplasty of his right lower extremity in [place] by Dr.[name] 2013  . Unspecified essential hypertension   . Wears dentures    full upper and lower   Past Surgical History:  Procedure Laterality Date  . ABDOMINAL AORTOGRAM W/LOWER EXTREMITY N/A 11/15/2017   Procedure: ABDOMINAL AORTOGRAM W/LOWER EXTREMITY;  Surgeon: [name], MD;  Location: [facility];  Service: Cardiovascular;  Laterality: N/A;  . Adenosine Myoview  3/06   EF 56%, neg. Ischemia  . Adenosine Myoview  02/18/07   nml  . AMPUTATION Left 12/15/2017   Procedure:  AMPUTATION BELOW KNEE;  Surgeon: Algernon [name], MD;  Location: [facility];  Service: Vascular;  Laterality: Left;  . ANGIOPLASTY  1/99   CAD- diogonal with rotational artherectomy  . Arthrectomy     of LAD & PTCA  . BLEPHAROPLASTY Bilateral   . CARDIAC CATHETERIZATION  1/00  . CARDIOVERSION  1/04  . CARDIOVERSION  5/07   hospital- a flutter  . CARPAL TUNNEL RELEASE     ? bilateral  . CATARACT EXTRACTION W/PHACO Right 07/28/2017   Procedure: CATARACT EXTRACTION PHACO AND INTRAOCULAR LENS PLACEMENT ([place]) RIGHT DIABETIC;  Surgeon: [name], MD;  Location: [facility];  Service: Ophthalmology;  Laterality: Right;  Diabetic - diet controlled  . CATARACT EXTRACTION W/PHACO Left 08/18/2017   Procedure: CATARACT EXTRACTION PHACO AND INTRAOCULAR LENS PLACEMENT ([facility]);  Surgeon: [name], MD;  Location: [facility];  Service: Ophthalmology;  Laterality: Left;  DIABETES - oral meds  . COLONOSCOPY W/ BIOPSIES  10/01/06   sigmoid polyp bx neg, 3 years  . CORONARY ANGIOPLASTY  4/03   cutting balloon PTCA pLAD into Diag  . CORONARY ARTERY BYPASS GRAFT  2000   LIMA-LAD, SVG-RCA, SVG-Diag; SVG-Diag & SVG-RCA occluded 2003  . FRACTURE SURGERY    . HAND SURGERY  08/27/09   R thumb procedure wit  Scaphoid Gragt and screws, Dr [name]  . NM MYOVIEW LTD  4/11   normal  . PERIPHERAL VASCULAR CATHETERIZATION Right 06/27/2015   Procedure: Lower Extremity Angiography;  Surgeon: Algernon [name], MD;  Location: [facility];  Service: Cardiovascular;  Laterality: Right;  . PERIPHERAL VASCULAR CATHETERIZATION  06/27/2015   Procedure: Lower Extremity Intervention;  Surgeon: Algernon [name], MD;  Location: [facility];  Service: Cardiovascular;;   Current Outpatient Medications on File Prior to Visit  Medication Sig Dispense Refill  . acetaminophen (TYLENOL) 325 MG tablet Take 2 tablets (650 mg total) by mouth every 6 (six) hours as needed for mild pain (or temp >/= 101 F).     [name] amLODipine (NORVASC) 10 MG tablet     . apixaban (ELIQUIS) 2.5 MG TABS tablet Take 1 tablet (2.5 mg total) by mouth 2 (two) times daily. 180 tablet 1  . B Complex Vitamins (B COMPLEX-B12) TABS Take 1 tablet by mouth daily.     . Blood Glucose Monitoring Suppl (ONE TOUCH ULTRA SYSTEM KIT) W/DEVICE KIT 1 kit by Does not apply route once. 1 each 0  . calcium carbonate (TUMS - DOSED IN MG ELEMENTAL CALCIUM) 500 MG chewable tablet Chew 1 tablet by mouth daily as needed for indigestion or heartburn.    . cholecalciferol (VITAMIN D) 1000 UNITS tablet Take 1,000 Units by mouth 2 (two) times a week. Takes on Mon and Fri    . Cinnamon 500 MG capsule Take 1,000 mg by mouth 2 (two) times daily.     . feeding supplement, GLUCERNA SHAKE, (GLUCERNA SHAKE) LIQD Take 237 mLs by mouth daily.    . fenofibrate 160 MG tablet Take 1 tablet (160 mg total) by mouth daily. 90 tablet 3  . ferrous sulfate 325 (65 FE) MG EC tablet Take 325 mg by mouth 3 (three) times daily with meals.    . finasteride (PROSCAR) 5 MG tablet TAKE 1 TABLET BY MOUTH ONCE DAILY 90 tablet 3  . fish oil-omega-3 fatty acids 1000 MG capsule Take 1,000 mg by mouth daily.     [name] glucose blood test strip Use as instructed 100 each 3  . lisinopril (PRINIVIL,ZESTRIL) 20 MG tablet Take 1 tablet (20 mg total) by mouth daily. 30 tablet 0  . metoprolol tartrate (LOPRESSOR) 25 MG tablet Take 0.5 tablets (12.5 mg total) by mouth 2 (two) times daily. 90 tablet 3  . Multiple Vitamin (DAILY MULTIVITAMIN PO) Take 1 tablet by mouth daily.      . ondansetron (ZOFRAN) 4 MG tablet Take 4 mg by mouth every 6 (six) hours as needed for nausea or vomiting.    [name] DELICA LANCETS FINE MISC check sugar once daily 100 each 2  . pentoxifylline (TRENTAL) 400 MG CR tablet Take 1 tablet (400 mg total) by mouth 3 (three) times daily with meals. 90 tablet 3  . rosuvastatin (CRESTOR) 40 MG tablet Take 1 tablet (40 mg total) by mouth daily. 90 tablet 2  . saccharomyces  boulardii (FLORASTOR) 250 MG capsule Take 1 capsule (250 mg total) by mouth daily. 90 capsule 3  . SANTYL ointment APPLY DAILY TO WOUND AS DIRECTED  5  . silver sulfADIAZINE (SILVADENE) 1 % cream Apply 1 application topically daily. Place to left lateral staple incision also place to right lateral foot    . tamsulosin (FLOMAX) 0.4 MG CAPS capsule Take 1 capsule (0.4 mg total) by mouth daily. 90 capsule 3   No current facility-administered medications  on file prior to visit.    Allergies  Allergen Reactions  . Isosorbide Mononitrate Other (See Comments)    Unknown, doesn't remember   . Ramipril Cough  . Quinidine Gluconate Rash   Social History   Socioeconomic History  . Marital status: Married    Spouse name: Not on file  . Number of children: 5  . Years of education: Not on file  . Highest education level: Not on file  Occupational History  . Occupation: [employer]    Employer: RETIRED  Social Needs  . Financial resource strain: Not on file  . Food insecurity:    Worry: Not on file    Inability: Not on file  . Transportation needs:    Medical: Not on file    Non-medical: Not on file  Tobacco Use  . Smoking status: Never Smoker  . Smokeless tobacco: Former Network engineer and Sexual Activity  . Alcohol use: No  . Drug use: No  . Sexual activity: Never  Lifestyle  . Physical activity:    Days per week: Not on file    Minutes per session: Not on file  . Stress: Not on file  Relationships  . Social connections:    Talks on phone: Not on file    Gets together: Not on file    Attends religious service: Not on file    Active member of club or organization: Not on file    Attends meetings of clubs or organizations: Not on file    Relationship status: Not on file  . Intimate partner violence:    Fear of current or ex partner: Not on file    Emotionally abused: Not on file    Physically abused: Not on file    Forced sexual activity: Not on file  Other Topics  Concern  . Not on file  Social History Narrative   Retired now does some antiques   Retired from Theatre manager and dye work   Married 1951   5 kids     Review of Systems  Gastrointestinal: Positive for diarrhea.  All other systems reviewed and are negative.      Objective:   Physical Exam Vitals signs reviewed.  Constitutional:      General: He is not in acute distress.    Appearance: He is well-developed. He is not diaphoretic.  Eyes:     General: No scleral icterus.       Right eye: No discharge.        Left eye: No discharge.     Conjunctiva/sclera: Conjunctivae normal.  Neck:     Musculoskeletal: Neck supple.     Vascular: No JVD.  Cardiovascular:     Rate and Rhythm: Normal rate. Rhythm irregularly irregular.     Heart sounds: Normal heart sounds.  Pulmonary:     Effort: Pulmonary effort is normal. No tachypnea, bradypnea, prolonged expiration or respiratory distress.     Breath sounds: Decreased air movement present. No stridor. Examination of the left-lower field reveals rhonchi. Wheezing and rhonchi present. No rales.  Abdominal:     General: Bowel sounds are normal. There is no distension.     Palpations: Abdomen is soft.     Tenderness: There is no abdominal tenderness. There is no guarding.  Lymphadenopathy:     Cervical: No cervical adenopathy.         Assessment & Plan:  Bronchitis - Plan: DG Chest 2 View Patient symptoms are concerning for bronchitis and  possibly developing early community-acquired pneumonia.  I will start the patient on doxycycline 100 mg p.o. twice daily for 10 days and have recommended that he go get a chest x-ray immediately.  He can use Mucinex 400 mg every 4-6 hours as needed for cough.  Recheck in 48 hours if no better or sooner if worse.

## 2018-09-14 ENCOUNTER — Ambulatory Visit (HOSPITAL_COMMUNITY)
Admission: RE | Admit: 2018-09-14 | Discharge: 2018-09-14 | Disposition: A | Discharge status: Home / Self Care | Payer: PPO | Source: Ambulatory Visit | Attending: Family Medicine | Admitting: Family Medicine

## 2018-09-14 ENCOUNTER — Other Ambulatory Visit: Payer: Self-pay | Admitting: Family Medicine

## 2018-09-14 DIAGNOSIS — J40 Bronchitis, not specified as acute or chronic: Secondary | ICD-10-CM | POA: Insufficient documentation

## 2018-09-14 DIAGNOSIS — M545 Low back pain, unspecified: Secondary | ICD-10-CM

## 2018-09-14 DIAGNOSIS — R05 Cough: Secondary | ICD-10-CM | POA: Diagnosis not present

## 2018-09-16 ENCOUNTER — Other Ambulatory Visit: Payer: Self-pay | Admitting: Family Medicine

## 2018-09-26 DIAGNOSIS — S88112D Complete traumatic amputation at level between knee and ankle, left lower leg, subsequent encounter: Secondary | ICD-10-CM | POA: Diagnosis not present

## 2018-09-28 DIAGNOSIS — S88112D Complete traumatic amputation at level between knee and ankle, left lower leg, subsequent encounter: Secondary | ICD-10-CM | POA: Diagnosis not present

## 2018-09-28 DIAGNOSIS — E11621 Type 2 diabetes mellitus with foot ulcer: Secondary | ICD-10-CM | POA: Diagnosis not present

## 2018-10-06 DIAGNOSIS — M9902 Segmental and somatic dysfunction of thoracic region: Secondary | ICD-10-CM | POA: Diagnosis not present

## 2018-10-06 DIAGNOSIS — M9903 Segmental and somatic dysfunction of lumbar region: Secondary | ICD-10-CM | POA: Diagnosis not present

## 2018-10-06 DIAGNOSIS — M545 Low back pain: Secondary | ICD-10-CM | POA: Diagnosis not present

## 2018-10-06 DIAGNOSIS — M9905 Segmental and somatic dysfunction of pelvic region: Secondary | ICD-10-CM | POA: Diagnosis not present

## 2018-10-10 DIAGNOSIS — M9902 Segmental and somatic dysfunction of thoracic region: Secondary | ICD-10-CM | POA: Diagnosis not present

## 2018-10-10 DIAGNOSIS — S32020A Wedge compression fracture of second lumbar vertebra, initial encounter for closed fracture: Secondary | ICD-10-CM | POA: Diagnosis not present

## 2018-10-10 DIAGNOSIS — M9905 Segmental and somatic dysfunction of pelvic region: Secondary | ICD-10-CM | POA: Diagnosis not present

## 2018-10-10 DIAGNOSIS — M9903 Segmental and somatic dysfunction of lumbar region: Secondary | ICD-10-CM | POA: Diagnosis not present

## 2018-10-10 DIAGNOSIS — M545 Low back pain: Secondary | ICD-10-CM | POA: Diagnosis not present

## 2018-10-13 DIAGNOSIS — M545 Low back pain: Secondary | ICD-10-CM | POA: Diagnosis not present

## 2018-10-13 DIAGNOSIS — M9905 Segmental and somatic dysfunction of pelvic region: Secondary | ICD-10-CM | POA: Diagnosis not present

## 2018-10-13 DIAGNOSIS — M9903 Segmental and somatic dysfunction of lumbar region: Secondary | ICD-10-CM | POA: Diagnosis not present

## 2018-10-13 DIAGNOSIS — S32020A Wedge compression fracture of second lumbar vertebra, initial encounter for closed fracture: Secondary | ICD-10-CM | POA: Diagnosis not present

## 2018-10-13 DIAGNOSIS — M9902 Segmental and somatic dysfunction of thoracic region: Secondary | ICD-10-CM | POA: Diagnosis not present

## 2018-10-17 ENCOUNTER — Encounter: Payer: Self-pay | Admitting: Family Medicine

## 2018-10-17 DIAGNOSIS — M9903 Segmental and somatic dysfunction of lumbar region: Secondary | ICD-10-CM | POA: Diagnosis not present

## 2018-10-17 DIAGNOSIS — M545 Low back pain: Secondary | ICD-10-CM | POA: Diagnosis not present

## 2018-10-17 DIAGNOSIS — M9902 Segmental and somatic dysfunction of thoracic region: Secondary | ICD-10-CM | POA: Diagnosis not present

## 2018-10-17 DIAGNOSIS — M9905 Segmental and somatic dysfunction of pelvic region: Secondary | ICD-10-CM | POA: Diagnosis not present

## 2018-10-17 DIAGNOSIS — S32020A Wedge compression fracture of second lumbar vertebra, initial encounter for closed fracture: Secondary | ICD-10-CM | POA: Diagnosis not present

## 2018-10-20 ENCOUNTER — Encounter: Payer: Self-pay | Admitting: Family Medicine

## 2018-10-20 ENCOUNTER — Other Ambulatory Visit: Payer: Self-pay

## 2018-10-20 ENCOUNTER — Ambulatory Visit (INDEPENDENT_AMBULATORY_CARE_PROVIDER_SITE_OTHER): Payer: PPO | Admitting: Family Medicine

## 2018-10-20 VITALS — BP 160/70 | HR 60 | Temp 97.6°F | Resp 14 | Ht 65.0 in | Wt 164.0 lb

## 2018-10-20 DIAGNOSIS — Z79899 Other long term (current) drug therapy: Secondary | ICD-10-CM | POA: Diagnosis not present

## 2018-10-20 DIAGNOSIS — M9905 Segmental and somatic dysfunction of pelvic region: Secondary | ICD-10-CM | POA: Diagnosis not present

## 2018-10-20 DIAGNOSIS — E781 Pure hyperglyceridemia: Secondary | ICD-10-CM

## 2018-10-20 DIAGNOSIS — S32020A Wedge compression fracture of second lumbar vertebra, initial encounter for closed fracture: Secondary | ICD-10-CM | POA: Diagnosis not present

## 2018-10-20 DIAGNOSIS — L84 Corns and callosities: Secondary | ICD-10-CM | POA: Diagnosis not present

## 2018-10-20 DIAGNOSIS — M545 Low back pain: Secondary | ICD-10-CM | POA: Diagnosis not present

## 2018-10-20 DIAGNOSIS — I10 Essential (primary) hypertension: Secondary | ICD-10-CM

## 2018-10-20 DIAGNOSIS — M9902 Segmental and somatic dysfunction of thoracic region: Secondary | ICD-10-CM | POA: Diagnosis not present

## 2018-10-20 DIAGNOSIS — M9903 Segmental and somatic dysfunction of lumbar region: Secondary | ICD-10-CM | POA: Diagnosis not present

## 2018-10-20 NOTE — Addendum Note (Signed)
Addended by: Shary Decamp B on: 10/20/2018 11:21 AM   Modules accepted: Orders

## 2018-10-20 NOTE — Addendum Note (Signed)
Addended by: Shary Decamp B on: 10/20/2018 11:24 AM   Modules accepted: Orders

## 2018-10-20 NOTE — Progress Notes (Signed)
Subjective:    Patient ID: Eric [name]., male    DOB: 14-Jun-1928, 85 y.o.   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  1 week ago, he saw blood on his floor.  He noticed a wound on the plantar aspect of his right foot.  There is a thick callus forming on the plantar aspect of the fifth MTP joint.  There is a hematoma within the callus.  The callus is the diameter of a nickel with thick hyperkeratotic skin.  The hematoma under the callus is roughly 1 cm x 1 cm Past Medical History:  Diagnosis Date  . Atrial flutter ([place])    s/p RFCA  . CAD (coronary artery disease)    cath 2003, occluded S-RCA, occluded S-Dx, L-LAD ok, s/p PTCA to LAD  . Cataract   . CKD (chronic kidney disease) stage 3, GFR 30-59 ml/min (HCC)   . Diabetes mellitus    diet controlled  . Hearing aid worn    bilateral  . History of kidney stones   . Long term (current) use of anticoagulants   . Neuromuscular disorder ([place])   . OSA (obstructive sleep apnea) 12/11   very mild, AHI 7/hr  . Persistent atrial fibrillation 09/26/2015  . Pure hyperglyceridemia   . PVD (peripheral vascular disease) ([place])    angioplasty of his right lower extremity in [place] by Dr.[name] 2013  . Unspecified essential hypertension   . Wears dentures    full upper and lower   Past Surgical History:  Procedure Laterality Date  . ABDOMINAL AORTOGRAM W/LOWER EXTREMITY N/A 11/15/2017   Procedure: ABDOMINAL AORTOGRAM W/LOWER EXTREMITY;  Surgeon: [name], MD;  Location: [place] [facility];  Service: Cardiovascular;  Laterality: N/A;  . Adenosine Myoview  3/06   EF 56%, neg. Ischemia  . Adenosine Myoview  02/18/07   nml  . AMPUTATION Left 12/15/2017   Procedure: AMPUTATION BELOW KNEE;  Surgeon: Algernon [name], MD;  Location: [facility];  Service: Vascular;  Laterality: Left;  . ANGIOPLASTY  1/99   CAD- diogonal with rotational artherectomy   . Arthrectomy     of LAD & PTCA  . BLEPHAROPLASTY Bilateral   . CARDIAC CATHETERIZATION  1/00  . CARDIOVERSION  1/04  . CARDIOVERSION  5/07   hospital- a flutter  . CARPAL TUNNEL RELEASE     ? bilateral  . CATARACT EXTRACTION W/PHACO Right 07/28/2017   Procedure: CATARACT EXTRACTION PHACO AND INTRAOCULAR LENS PLACEMENT ([place]) RIGHT DIABETIC;  Surgeon: [name], MD;  Location: [place];  Service: Ophthalmology;  Laterality: Right;  Diabetic - diet controlled  . CATARACT EXTRACTION W/PHACO Left 08/18/2017   Procedure: CATARACT EXTRACTION PHACO AND INTRAOCULAR LENS PLACEMENT ([facility]);  Surgeon: [name], MD;  Location: [place];  Service: Ophthalmology;  Laterality: Left;  DIABETES - oral meds  . COLONOSCOPY W/ BIOPSIES  10/01/06   sigmoid polyp bx neg, 3 years  . CORONARY ANGIOPLASTY  4/03   cutting balloon PTCA pLAD into Diag  . CORONARY ARTERY BYPASS GRAFT  2000   LIMA-LAD, SVG-RCA, SVG-Diag; SVG-Diag & SVG-RCA occluded 2003  . FRACTURE SURGERY    . HAND SURGERY  08/27/09   R thumb procedure wit Scaphoid Gragt and screws, Dr [name]  . NM MYOVIEW LTD  4/11   normal  . PERIPHERAL VASCULAR CATHETERIZATION Right 06/27/2015   Procedure: Lower Extremity Angiography;  Surgeon: Corene Cornea  Bunnie Domino, MD;  Location: [facility];  Service: Cardiovascular;  Laterality: Right;  . PERIPHERAL VASCULAR CATHETERIZATION  06/27/2015   Procedure: Lower Extremity Intervention;  Surgeon: Algernon [name], MD;  Location: [facility];  Service: Cardiovascular;;   Current Outpatient Medications on File Prior to Visit  Medication Sig Dispense Refill  . acetaminophen (TYLENOL) 325 MG tablet Take 2 tablets (650 mg total) by mouth every 6 (six) hours as needed for mild pain (or temp >/= 101 F).    [name] amLODipine (NORVASC) 10 MG tablet     . apixaban (ELIQUIS) 2.5 MG TABS tablet Take 1 tablet (2.5 mg total) by mouth 2 (two) times daily. 180 tablet 1  . B Complex Vitamins  (B COMPLEX-B12) TABS Take 1 tablet by mouth daily.     . Blood Glucose Monitoring Suppl (ONE TOUCH ULTRA SYSTEM KIT) W/DEVICE KIT 1 kit by Does not apply route once. 1 each 0  . calcium carbonate (TUMS - DOSED IN MG ELEMENTAL CALCIUM) 500 MG chewable tablet Chew 1 tablet by mouth daily as needed for indigestion or heartburn.    . cholecalciferol (VITAMIN D) 1000 UNITS tablet Take 1,000 Units by mouth 2 (two) times a week. Takes on Mon and Fri    . Cinnamon 500 MG capsule Take 1,000 mg by mouth 2 (two) times daily.     [name] doxycycline (VIBRA-TABS) 100 MG tablet Take 1 tablet (100 mg total) by mouth 2 (two) times daily. 20 tablet 0  . feeding supplement, GLUCERNA SHAKE, (GLUCERNA SHAKE) LIQD Take 237 mLs by mouth daily.    . fenofibrate 160 MG tablet Take 1 tablet (160 mg total) by mouth daily. 90 tablet 3  . ferrous sulfate 325 (65 FE) MG EC tablet Take 325 mg by mouth 3 (three) times daily with meals.    . finasteride (PROSCAR) 5 MG tablet TAKE 1 TABLET BY MOUTH ONCE DAILY 90 tablet 3  . fish oil-omega-3 fatty acids 1000 MG capsule Take 1,000 mg by mouth daily.     [name] glucose blood test strip Use as instructed 100 each 3  . lisinopril (PRINIVIL,ZESTRIL) 20 MG tablet Take 1 tablet (20 mg total) by mouth daily. 30 tablet 0  . metoprolol tartrate (LOPRESSOR) 25 MG tablet Take 0.5 tablets (12.5 mg total) by mouth 2 (two) times daily. 90 tablet 3  . Multiple Vitamin (DAILY MULTIVITAMIN PO) Take 1 tablet by mouth daily.      . ondansetron (ZOFRAN) 4 MG tablet Take 4 mg by mouth every 6 (six) hours as needed for nausea or vomiting.    [name] DELICA LANCETS FINE MISC check sugar once daily 100 each 2  . pentoxifylline (TRENTAL) 400 MG CR tablet Take 1 tablet (400 mg total) by mouth 3 (three) times daily with meals. 90 tablet 3  . rosuvastatin (CRESTOR) 40 MG tablet Take 1 tablet (40 mg total) by mouth daily. 90 tablet 2  . saccharomyces boulardii (FLORASTOR) 250 MG capsule Take 1 capsule (250 mg total)  by mouth daily. 90 capsule 3  . SANTYL ointment APPLY DAILY TO WOUND AS DIRECTED  5  . silver sulfADIAZINE (SILVADENE) 1 % cream Apply 1 application topically daily. Place to left lateral staple incision also place to right lateral foot    . tamsulosin (FLOMAX) 0.4 MG CAPS capsule Take 1 capsule (0.4 mg total) by mouth daily. 90 capsule 3   No current facility-administered medications on file prior to visit.    Allergies  Allergen Reactions  . Isosorbide Mononitrate Other (See Comments)    Unknown, doesn't remember   . Ramipril Cough  . Quinidine Gluconate Rash   Social History   Socioeconomic History  . Marital status: Married    Spouse name: Not on file  . Number of children: 5  . Years of education: Not on file  . Highest education level: Not on file  Occupational History  . Occupation: [employer]    Employer: RETIRED  Social Needs  . Financial resource strain: Not on file  . Food insecurity:    Worry: Not on file    Inability: Not on file  . Transportation needs:    Medical: Not on file    Non-medical: Not on file  Tobacco Use  . Smoking status: Never Smoker  . Smokeless tobacco: Former Network engineer and Sexual Activity  . Alcohol use: No  . Drug use: No  . Sexual activity: Never  Lifestyle  . Physical activity:    Days per week: Not on file    Minutes per session: Not on file  . Stress: Not on file  Relationships  . Social connections:    Talks on phone: Not on file    Gets together: Not on file    Attends religious service: Not on file    Active member of club or organization: Not on file    Attends meetings of clubs or organizations: Not on file    Relationship status: Not on file  . Intimate partner violence:    Fear of current or ex partner: Not on file    Emotionally abused: Not on file    Physically abused: Not on file    Forced sexual activity: Not on file  Other Topics Concern  . Not on file  Social History Narrative   Retired now does  some antiques   Retired from Theatre manager and dye work   Married 1951   5 kids     Review of Systems  Gastrointestinal: Positive for diarrhea.  All other systems reviewed and are negative.      Objective:   Physical Exam Vitals signs reviewed.  Constitutional:      General: He is not in acute distress.    Appearance: He is well-developed. He is not diaphoretic.  Eyes:     General: No scleral icterus.       Right eye: No discharge.        Left eye: No discharge.     Conjunctiva/sclera: Conjunctivae normal.  Neck:     Musculoskeletal: Neck supple.     Vascular: No JVD.  Cardiovascular:     Rate and Rhythm: Normal rate. Rhythm irregularly irregular.     Heart sounds: Normal heart sounds.  Pulmonary:     Effort: Pulmonary effort is normal. No tachypnea, bradypnea, prolonged expiration or respiratory distress.     Breath sounds: Decreased air movement present. No stridor. No wheezing, rhonchi or rales.  Abdominal:     General: Bowel sounds are normal. There is no distension.     Palpations: Abdomen is soft.     Tenderness: There is no abdominal tenderness. There is no guarding.  Musculoskeletal:     Right foot: Normal range of motion. Deformity present. No tenderness.       Feet:  Lymphadenopathy:     Cervical: No cervical adenopathy.         Assessment & Plan:  Patient has a pre-ulcerative callus forming on the plantar  aspect of his right fifth MTP joint.  There is also a hematoma below the callus.  Using a razor blade, I debrided the hyperkeratotic tissue and the hematoma.  This area was raised and thick and was having excessive pressure due to its location and its raised state.  By debulking this, the patient will now be able to carry his weight on his foot not on the callus itself.  I covered the wound with Silvadene and nonadherent gauze and wrapped it with coban.  Recommended the patient stay off his feet as much as possible and recheck the patient on Monday.  Continue wound  checks twice weekly until healed.  Patient will need to see a podiatrist if not healing appropriately

## 2018-10-21 ENCOUNTER — Encounter: Payer: Self-pay | Admitting: *Deleted

## 2018-10-21 LAB — CBC WITH DIFFERENTIAL/PLATELET
Absolute Monocytes: 606 cells/uL (ref 200–950)
BASOS ABS: 101 {cells}/uL (ref 0–200)
Basophils Relative: 1 %
Eosinophils Absolute: 333 cells/uL (ref 15–500)
Eosinophils Relative: 3.3 %
HCT: 34.5 % — ABNORMAL LOW (ref 38.5–50.0)
HEMOGLOBIN: 11 g/dL — AB (ref 13.2–17.1)
Lymphs Abs: 3343 cells/uL (ref 850–3900)
MCH: 30.1 pg (ref 27.0–33.0)
MCHC: 31.9 g/dL — ABNORMAL LOW (ref 32.0–36.0)
MCV: 94.3 fL (ref 80.0–100.0)
MPV: 10.6 fL (ref 7.5–12.5)
Monocytes Relative: 6 %
NEUTROS ABS: 5717 {cells}/uL (ref 1500–7800)
Neutrophils Relative %: 56.6 %
Platelets: 224 10*3/uL (ref 140–400)
RBC: 3.66 10*6/uL — ABNORMAL LOW (ref 4.20–5.80)
RDW: 14.5 % (ref 11.0–15.0)
Total Lymphocyte: 33.1 %
WBC: 10.1 10*3/uL (ref 3.8–10.8)

## 2018-10-21 LAB — LIPID PANEL
CHOL/HDL RATIO: 3.9 (calc) (ref <5.0)
Cholesterol: 98 mg/dL (ref <200)
HDL: 25 mg/dL — ABNORMAL LOW (ref >=40)
LDL Cholesterol (Calc): 52 mg/dL (calc)
Non-HDL Cholesterol (Calc): 73 mg/dL (calc) (ref <130)
Triglycerides: 120 mg/dL (ref <150)

## 2018-10-21 LAB — COMPREHENSIVE METABOLIC PANEL
AG Ratio: 1.3 (calc) (ref 1.0–2.5)
ALT: 16 U/L (ref 9–46)
AST: 29 U/L (ref 10–35)
Albumin: 3.7 g/dL (ref 3.6–5.1)
Alkaline phosphatase (APISO): 36 U/L (ref 35–144)
BUN/Creatinine Ratio: 17 (calc) (ref 6–22)
BUN: 24 mg/dL (ref 7–25)
CO2: 25 mmol/L (ref 20–32)
Calcium: 9.6 mg/dL (ref 8.6–10.3)
Chloride: 109 mmol/L (ref 98–110)
Creat: 1.45 mg/dL — ABNORMAL HIGH (ref 0.70–1.11)
Globulin: 2.8 g/dL (calc) (ref 1.9–3.7)
Glucose, Bld: 81 mg/dL (ref 65–99)
POTASSIUM: 5.2 mmol/L (ref 3.5–5.3)
Sodium: 141 mmol/L (ref 135–146)
Total Bilirubin: 0.6 mg/dL (ref 0.2–1.2)
Total Protein: 6.5 g/dL (ref 6.1–8.1)

## 2018-10-21 LAB — HEMOGLOBIN A1C
HEMOGLOBIN A1C: 5.5 %{Hb} (ref <5.7)
Mean Plasma Glucose: 111 (calc)
eAG (mmol/L): 6.2 (calc)

## 2018-10-24 ENCOUNTER — Ambulatory Visit (INDEPENDENT_AMBULATORY_CARE_PROVIDER_SITE_OTHER): Payer: PPO | Admitting: Family Medicine

## 2018-10-24 ENCOUNTER — Other Ambulatory Visit: Payer: Self-pay

## 2018-10-24 ENCOUNTER — Encounter: Payer: Self-pay | Admitting: Family Medicine

## 2018-10-24 VITALS — BP 122/64 | HR 52 | Temp 97.4°F | Ht 65.0 in | Wt 163.0 lb

## 2018-10-24 DIAGNOSIS — L84 Corns and callosities: Secondary | ICD-10-CM | POA: Diagnosis not present

## 2018-10-24 NOTE — Progress Notes (Signed)
Subjective:    Patient ID: Eric [name]., male    DOB: 1928/07/03, 85 y.o.   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  1 week ago, he saw blood on his floor.  He noticed a wound on the plantar aspect of his right foot.  There is a thick callus forming on the plantar aspect of the fifth MTP joint.  There is a hematoma within the callus.  The callus is the diameter of a nickel with thick hyperkeratotic skin.  The hematoma under the callus is roughly 1 cm x 1 cm.  At that time, my plan was: Patient has a pre-ulcerative callus forming on the plantar aspect of his right fifth MTP joint.  There is also a hematoma below the callus.  Using a razor blade, I debrided the hyperkeratotic tissue and the hematoma.  This area was raised and thick and was having excessive pressure due to its location and its raised state.  By debulking this, the patient will now be able to carry his weight on his foot not on the callus itself.  I covered the wound with Silvadene and nonadherent gauze and wrapped it with coban.  Recommended the patient stay off his feet as much as possible and recheck the patient on Monday.  Continue wound checks twice weekly until healed.  Patient will need to see a podiatrist if not healing appropriately  10/24/18 Wound looks much better on the foot.  There is no breakdown of the skin.  The center area that was debrided last time is now much softer and supple.  There is a halo of thick yellow clear hyperkeratotic tissue without dermal ridges that requires debridement.  There is no residual hematoma.  There is no evidence of cellulitis.  There is no breakdown in skin. Past Medical History:  Diagnosis Date  . Atrial flutter ([place])    s/p RFCA  . CAD (coronary artery disease)    cath 2003, occluded S-RCA, occluded S-Dx, L-LAD ok, s/p PTCA to LAD  . Cataract   . CKD (chronic kidney  disease) stage 3, GFR 30-59 ml/min (HCC)   . Diabetes mellitus    diet controlled  . Hearing aid worn    bilateral  . History of kidney stones   . Long term (current) use of anticoagulants   . Neuromuscular disorder ([place])   . OSA (obstructive sleep apnea) 12/11   very mild, AHI 7/hr  . Persistent atrial fibrillation 09/26/2015  . Pure hyperglyceridemia   . PVD (peripheral vascular disease) ([place])    angioplasty of his right lower extremity in [place] by Dr.[name] 2013  . Unspecified essential hypertension   . Wears dentures    full upper and lower   Past Surgical History:  Procedure Laterality Date  . ABDOMINAL AORTOGRAM W/LOWER EXTREMITY N/A 11/15/2017   Procedure: ABDOMINAL AORTOGRAM W/LOWER EXTREMITY;  Surgeon: [name], MD;  Location: [facility];  Service: Cardiovascular;  Laterality: N/A;  . Adenosine Myoview  3/06   EF 56%, neg. Ischemia  . Adenosine Myoview  02/18/07   nml  . AMPUTATION Left 12/15/2017   Procedure: AMPUTATION BELOW KNEE;  Surgeon: Algernon [name], MD;  Location: [facility];  Service: Vascular;  Laterality: Left;  . ANGIOPLASTY  1/99   CAD- diogonal with rotational artherectomy  . Arthrectomy     of LAD & PTCA  . BLEPHAROPLASTY  Bilateral   . CARDIAC CATHETERIZATION  1/00  . CARDIOVERSION  1/04  . CARDIOVERSION  5/07   hospital- a flutter  . CARPAL TUNNEL RELEASE     ? bilateral  . CATARACT EXTRACTION W/PHACO Right 07/28/2017   Procedure: CATARACT EXTRACTION PHACO AND INTRAOCULAR LENS PLACEMENT ([place]) RIGHT DIABETIC;  Surgeon: [name], MD;  Location: [place] [facility];  Service: Ophthalmology;  Laterality: Right;  Diabetic - diet controlled  . CATARACT EXTRACTION W/PHACO Left 08/18/2017   Procedure: CATARACT EXTRACTION PHACO AND INTRAOCULAR LENS PLACEMENT ([facility]);  Surgeon: [name], MD;  Location: [facility];  Service: Ophthalmology;  Laterality: Left;  DIABETES - oral meds  . COLONOSCOPY W/ BIOPSIES  10/01/06    sigmoid polyp bx neg, 3 years  . CORONARY ANGIOPLASTY  4/03   cutting balloon PTCA pLAD into Diag  . CORONARY ARTERY BYPASS GRAFT  2000   LIMA-LAD, SVG-RCA, SVG-Diag; SVG-Diag & SVG-RCA occluded 2003  . FRACTURE SURGERY    . HAND SURGERY  08/27/09   R thumb procedure wit Scaphoid Gragt and screws, Dr [name]  . NM MYOVIEW LTD  4/11   normal  . PERIPHERAL VASCULAR CATHETERIZATION Right 06/27/2015   Procedure: Lower Extremity Angiography;  Surgeon: Algernon [name], MD;  Location: [facility];  Service: Cardiovascular;  Laterality: Right;  . PERIPHERAL VASCULAR CATHETERIZATION  06/27/2015   Procedure: Lower Extremity Intervention;  Surgeon: Algernon [name], MD;  Location: [facility];  Service: Cardiovascular;;   Current Outpatient Medications on File Prior to Visit  Medication Sig Dispense Refill  . acetaminophen (TYLENOL) 325 MG tablet Take 2 tablets (650 mg total) by mouth every 6 (six) hours as needed for mild pain (or temp >/= 101 F).    [name] amLODipine (NORVASC) 10 MG tablet     . apixaban (ELIQUIS) 2.5 MG TABS tablet Take 1 tablet (2.5 mg total) by mouth 2 (two) times daily. 180 tablet 1  . B Complex Vitamins (B COMPLEX-B12) TABS Take 1 tablet by mouth daily.     . Blood Glucose Monitoring Suppl (ONE TOUCH ULTRA SYSTEM KIT) W/DEVICE KIT 1 kit by Does not apply route once. 1 each 0  . calcium carbonate (TUMS - DOSED IN MG ELEMENTAL CALCIUM) 500 MG chewable tablet Chew 1 tablet by mouth daily as needed for indigestion or heartburn.    . cholecalciferol (VITAMIN D) 1000 UNITS tablet Take 1,000 Units by mouth 2 (two) times a week. Takes on Mon and Fri    . Cinnamon 500 MG capsule Take 1,000 mg by mouth 2 (two) times daily.     [name] doxycycline (VIBRA-TABS) 100 MG tablet Take 1 tablet (100 mg total) by mouth 2 (two) times daily. 20 tablet 0  . fenofibrate 160 MG tablet Take 1 tablet (160 mg total) by mouth daily. 90 tablet 3  . ferrous sulfate 325 (65 FE) MG EC tablet Take 325 mg by mouth  3 (three) times daily with meals.    . finasteride (PROSCAR) 5 MG tablet TAKE 1 TABLET BY MOUTH ONCE DAILY 90 tablet 3  . fish oil-omega-3 fatty acids 1000 MG capsule Take 1,000 mg by mouth daily.     [name] glucose blood test strip Use as instructed 100 each 3  . lisinopril (PRINIVIL,ZESTRIL) 20 MG tablet Take 1 tablet (20 mg total) by mouth daily. 30 tablet 0  . metoprolol tartrate (LOPRESSOR) 25 MG tablet Take 0.5 tablets (12.5 mg total) by mouth 2 (two) times daily. 90 tablet 3  .  Multiple Vitamin (DAILY MULTIVITAMIN PO) Take 1 tablet by mouth daily.      . ondansetron (ZOFRAN) 4 MG tablet Take 4 mg by mouth every 6 (six) hours as needed for nausea or vomiting.    [name] DELICA LANCETS FINE MISC check sugar once daily 100 each 2  . pentoxifylline (TRENTAL) 400 MG CR tablet Take 1 tablet (400 mg total) by mouth 3 (three) times daily with meals. 90 tablet 3  . rosuvastatin (CRESTOR) 40 MG tablet Take 1 tablet (40 mg total) by mouth daily. 90 tablet 2  . saccharomyces boulardii (FLORASTOR) 250 MG capsule Take 1 capsule (250 mg total) by mouth daily. 90 capsule 3  . tamsulosin (FLOMAX) 0.4 MG CAPS capsule Take 1 capsule (0.4 mg total) by mouth daily. 90 capsule 3   No current facility-administered medications on file prior to visit.    Allergies  Allergen Reactions  . Isosorbide Mononitrate Other (See Comments)    Unknown, doesn't remember   . Ramipril Cough  . Quinidine Gluconate Rash   Social History   Socioeconomic History  . Marital status: Married    Spouse name: Not on file  . Number of children: 5  . Years of education: Not on file  . Highest education level: Not on file  Occupational History  . Occupation: [employer]    Employer: RETIRED  Social Needs  . Financial resource strain: Not on file  . Food insecurity:    Worry: Not on file    Inability: Not on file  . Transportation needs:    Medical: Not on file    Non-medical: Not on file  Tobacco Use  . Smoking  status: Never Smoker  . Smokeless tobacco: Former Network engineer and Sexual Activity  . Alcohol use: No  . Drug use: No  . Sexual activity: Never  Lifestyle  . Physical activity:    Days per week: Not on file    Minutes per session: Not on file  . Stress: Not on file  Relationships  . Social connections:    Talks on phone: Not on file    Gets together: Not on file    Attends religious service: Not on file    Active member of club or organization: Not on file    Attends meetings of clubs or organizations: Not on file    Relationship status: Not on file  . Intimate partner violence:    Fear of current or ex partner: Not on file    Emotionally abused: Not on file    Physically abused: Not on file    Forced sexual activity: Not on file  Other Topics Concern  . Not on file  Social History Narrative   Retired now does some antiques   Retired from Theatre manager and dye work   Married 1951   5 kids     Review of Systems  Gastrointestinal: Positive for diarrhea.  All other systems reviewed and are negative.      Objective:   Physical Exam Vitals signs reviewed.  Constitutional:      General: He is not in acute distress.    Appearance: He is well-developed. He is not diaphoretic.  Eyes:     General: No scleral icterus.       Right eye: No discharge.        Left eye: No discharge.     Conjunctiva/sclera: Conjunctivae normal.  Neck:     Musculoskeletal: Neck supple.  Vascular: No JVD.  Cardiovascular:     Rate and Rhythm: Normal rate. Rhythm irregularly irregular.     Heart sounds: Normal heart sounds.  Pulmonary:     Effort: Pulmonary effort is normal. No tachypnea, bradypnea, prolonged expiration or respiratory distress.     Breath sounds: Decreased air movement present. No stridor. No wheezing, rhonchi or rales.  Abdominal:     General: Bowel sounds are normal. There is no distension.     Palpations: Abdomen is soft.     Tenderness: There is no abdominal tenderness.  There is no guarding.  Musculoskeletal:     Right foot: Normal range of motion. Deformity present. No tenderness.       Feet:  Lymphadenopathy:     Cervical: No cervical adenopathy.         Assessment & Plan:  Pre-ulcerative corn or callous - Plan: Ambulatory referral to Podiatry  I debrided the halo of hyperkeratotic tissue there remain today.  The underlying skin is now much softer and more supple.  There is no residual hematoma in that area.  There is no evidence of cellulitis or skin breakdown.  I will consult podiatry for future follow-up.  The patient may require changes to his diabetic foot wear to help prevent this in the future.  The wound was covered with Silvadene and wrapped in Coban.  Care was discussed.  Recommended daily dressing changes until the skin has returned to a normal texture and feel

## 2018-10-27 DIAGNOSIS — E11621 Type 2 diabetes mellitus with foot ulcer: Secondary | ICD-10-CM | POA: Diagnosis not present

## 2018-10-27 DIAGNOSIS — S88112D Complete traumatic amputation at level between knee and ankle, left lower leg, subsequent encounter: Secondary | ICD-10-CM | POA: Diagnosis not present

## 2018-11-04 ENCOUNTER — Encounter: Payer: Self-pay | Admitting: Podiatry

## 2018-11-04 ENCOUNTER — Ambulatory Visit: Payer: PPO | Admitting: Podiatry

## 2018-11-04 ENCOUNTER — Other Ambulatory Visit: Payer: Self-pay

## 2018-11-04 DIAGNOSIS — M79676 Pain in unspecified toe(s): Secondary | ICD-10-CM | POA: Diagnosis not present

## 2018-11-04 DIAGNOSIS — M79609 Pain in unspecified limb: Secondary | ICD-10-CM

## 2018-11-04 DIAGNOSIS — Z89512 Acquired absence of left leg below knee: Secondary | ICD-10-CM

## 2018-11-04 DIAGNOSIS — B351 Tinea unguium: Secondary | ICD-10-CM | POA: Diagnosis not present

## 2018-11-04 DIAGNOSIS — L989 Disorder of the skin and subcutaneous tissue, unspecified: Secondary | ICD-10-CM | POA: Diagnosis not present

## 2018-11-04 NOTE — Patient Instructions (Signed)
Corn and Callus Remover at the pharmacy. It is over-the-counter.

## 2018-11-04 NOTE — Progress Notes (Signed)
   Subjective: Patient h/o BKA LLE 12/2018 presents to the office today for chief complaint of painful callus lesions of the feet. Patient states that the pain is ongoing and is affecting their ability to ambulate without pain. Callus has been managed by PCP [name] and there has been improvement of the wound.  Patient also states that he is recently been unable to trim his own nails.  His wife trims his nails however even she is having a hard time doing it.  He does have some pain with ambulation of the toenails.  Past Medical History:  Diagnosis Date  . Atrial flutter ([place])    s/p RFCA  . CAD (coronary artery disease)    cath 2003, occluded S-RCA, occluded S-Dx, L-LAD ok, s/p PTCA to LAD  . Cataract   . CKD (chronic kidney disease) stage 3, GFR 30-59 ml/min (HCC)   . Diabetes mellitus    diet controlled  . Hearing aid worn    bilateral  . History of kidney stones   . Long term (current) use of anticoagulants   . Neuromuscular disorder ([place])   . OSA (obstructive sleep apnea) 12/11   very mild, AHI 7/hr  . Persistent atrial fibrillation 09/26/2015  . Pure hyperglyceridemia   . PVD (peripheral vascular disease) ([place])    angioplasty of his right lower extremity in [place] by Dr.[name] 2013  . Unspecified essential hypertension   . Wears dentures    full upper and lower     Objective:  Physical Exam General: Alert and oriented x3 in no acute distress  Dermatology: Hyperkeratotic lesion present on the sub-fifth MTPJ of the right foot. Pain on palpation with a central nucleated core noted. Skin is warm, dry and supple bilateral lower extremities. Negative for open lesions or macerations. Elongated, thickened, hyperkeratotic nails noted 1-5 right lower extremity  Vascular: Diminished pedal pulses bilaterally. No edema or erythema noted. Capillary refill within normal limits.  Neurological: Epicritic and protective threshold grossly intact bilaterally.   Musculoskeletal Exam:  Pain on palpation at the keratotic lesion noted.  History of below-knee amputation left lower extremity  Assessment: 1. H/o BKA LLE 2.  Pre-ulcerative callus sub-fifth MTPJ right 3.  Pain due to onychomycosis of toenails 1-5 right   Plan of Care:  1. Patient evaluated 2. Excisional debridement of keratoic lesion using a chisel blade was performed without incident.  3. Dressed area with light dressing. 4.  Continue antibiotic ointment and a Band-Aid daily to the pre-ulcerative callus area  5.  Recommend over-the-counter corn and callus remover as needed  6.  Mechanical debridement of nails 1-5 right was performed using a nail nipper without incident or bleeding  7.  Patient is to return to the clinic PRN.   Edrick Kins, DPM [facility]  Dr. Edrick Kins, [address]                                        [place], [place] [address]                Office [phone]  Fax [phone]

## 2018-11-09 ENCOUNTER — Ambulatory Visit: Payer: PPO | Admitting: Cardiology

## 2018-11-18 ENCOUNTER — Other Ambulatory Visit: Payer: Self-pay | Admitting: Family Medicine

## 2018-11-25 DIAGNOSIS — S88112D Complete traumatic amputation at level between knee and ankle, left lower leg, subsequent encounter: Secondary | ICD-10-CM | POA: Diagnosis not present

## 2018-11-27 DIAGNOSIS — S88112D Complete traumatic amputation at level between knee and ankle, left lower leg, subsequent encounter: Secondary | ICD-10-CM | POA: Diagnosis not present

## 2018-12-17 ENCOUNTER — Other Ambulatory Visit: Payer: Self-pay | Admitting: Family Medicine

## 2018-12-23 ENCOUNTER — Other Ambulatory Visit: Payer: Self-pay | Admitting: Family Medicine

## 2018-12-25 DIAGNOSIS — S88112D Complete traumatic amputation at level between knee and ankle, left lower leg, subsequent encounter: Secondary | ICD-10-CM | POA: Diagnosis not present

## 2018-12-27 DIAGNOSIS — S88112D Complete traumatic amputation at level between knee and ankle, left lower leg, subsequent encounter: Secondary | ICD-10-CM | POA: Diagnosis not present

## 2019-01-03 ENCOUNTER — Other Ambulatory Visit (INDEPENDENT_AMBULATORY_CARE_PROVIDER_SITE_OTHER): Payer: Self-pay | Admitting: Nurse Practitioner

## 2019-01-03 DIAGNOSIS — I739 Peripheral vascular disease, unspecified: Secondary | ICD-10-CM

## 2019-01-05 ENCOUNTER — Other Ambulatory Visit: Payer: Self-pay

## 2019-01-05 ENCOUNTER — Ambulatory Visit (INDEPENDENT_AMBULATORY_CARE_PROVIDER_SITE_OTHER): Payer: PPO | Admitting: Nurse Practitioner

## 2019-01-05 ENCOUNTER — Ambulatory Visit (INDEPENDENT_AMBULATORY_CARE_PROVIDER_SITE_OTHER): Payer: PPO

## 2019-01-05 ENCOUNTER — Telehealth (INDEPENDENT_AMBULATORY_CARE_PROVIDER_SITE_OTHER): Payer: Self-pay

## 2019-01-05 ENCOUNTER — Encounter (INDEPENDENT_AMBULATORY_CARE_PROVIDER_SITE_OTHER): Payer: Self-pay | Admitting: Nurse Practitioner

## 2019-01-05 VITALS — BP 128/64 | HR 48 | Resp 12 | Ht 65.5 in | Wt 163.0 lb

## 2019-01-05 DIAGNOSIS — Z79899 Other long term (current) drug therapy: Secondary | ICD-10-CM

## 2019-01-05 DIAGNOSIS — I739 Peripheral vascular disease, unspecified: Secondary | ICD-10-CM

## 2019-01-05 DIAGNOSIS — Z89512 Acquired absence of left leg below knee: Secondary | ICD-10-CM

## 2019-01-05 DIAGNOSIS — I10 Essential (primary) hypertension: Secondary | ICD-10-CM

## 2019-01-05 DIAGNOSIS — Z9862 Peripheral vascular angioplasty status: Secondary | ICD-10-CM | POA: Diagnosis not present

## 2019-01-05 NOTE — Progress Notes (Signed)
SUBJECTIVE:  Patient ID: Eric Hough., male    DOB: 30-Sep-1927, 85 y.o.   MRN: [id number] Chief Complaint  Patient presents with  . Follow-up    HPI  Eric Watkinson. is a 85 y.o. male that presents today for noninvasive follow-up studies for his peripheral artery disease the patient has a previous history of a left below-knee amputation.  The patient has recently been seeing his podiatrist.  On his last visit it was noticed that there were some pre-ulcerative changes underneath his calluses.  The patient states that these areas that were debrided are still not quite healed and are still somewhat sore.  The patient also endorses walking somewhat less due to having a fall on his stump recently.  He states that the stump is swollen however the pain is greatly decreased at this time.  He denies any dehiscence of his wound or abrasions.  He denies any fever, chills, nausea, vomiting or diarrhea.  He denies any chest pain or shortness of breath.  He underwent ABIs today which reveal an ABI of 1.03 on his right lower extremity.  His duplex ultrasound of the right lower extremity reveals triphasic flow to the level of the anterior tibial artery which is occluded.  The posterior tibial artery is monophasic with a biphasic peroneal artery.  Past Medical History:  Diagnosis Date  . Atrial flutter ([place])    s/p RFCA  . CAD (coronary artery disease)    cath 2003, occluded S-RCA, occluded S-Dx, L-LAD ok, s/p PTCA to LAD  . Cataract   . CKD (chronic kidney disease) stage 3, GFR 30-59 ml/min (HCC)   . Diabetes mellitus    diet controlled  . Hearing aid worn    bilateral  . History of kidney stones   . Long term (current) use of anticoagulants   . Neuromuscular disorder ([place])   . OSA (obstructive sleep apnea) 12/11   very mild, AHI 7/hr  . Persistent atrial fibrillation 09/26/2015  . Pure hyperglyceridemia   . PVD (peripheral vascular disease) ([place])    angioplasty of his right lower  extremity in [place] by Dr.[name] 2013  . Unspecified essential hypertension   . Wears dentures    full upper and lower    Past Surgical History:  Procedure Laterality Date  . ABDOMINAL AORTOGRAM W/LOWER EXTREMITY N/A 11/15/2017   Procedure: ABDOMINAL AORTOGRAM W/LOWER EXTREMITY;  Surgeon: [name], MD;  Location: [facility];  Service: Cardiovascular;  Laterality: N/A;  . Adenosine Myoview  3/06   EF 56%, neg. Ischemia  . Adenosine Myoview  02/18/07   nml  . AMPUTATION Left 12/15/2017   Procedure: AMPUTATION BELOW KNEE;  Surgeon: Algernon [name], MD;  Location: [facility];  Service: Vascular;  Laterality: Left;  . ANGIOPLASTY  1/99   CAD- diogonal with rotational artherectomy  . Arthrectomy     of LAD & PTCA  . BLEPHAROPLASTY Bilateral   . CARDIAC CATHETERIZATION  1/00  . CARDIOVERSION  1/04  . CARDIOVERSION  5/07   hospital- a flutter  . CARPAL TUNNEL RELEASE     ? bilateral  . CATARACT EXTRACTION W/PHACO Right 07/28/2017   Procedure: CATARACT EXTRACTION PHACO AND INTRAOCULAR LENS PLACEMENT ([place]) RIGHT DIABETIC;  Surgeon: [name], MD;  Location: [facility];  Service: Ophthalmology;  Laterality: Right;  Diabetic - diet controlled  . CATARACT EXTRACTION W/PHACO Left 08/18/2017   Procedure: CATARACT EXTRACTION PHACO AND INTRAOCULAR LENS PLACEMENT (I[facility]);  Surgeon: [name],  MD;  Location: [facility];  Service: Ophthalmology;  Laterality: Left;  DIABETES - oral meds  . COLONOSCOPY W/ BIOPSIES  10/01/06   sigmoid polyp bx neg, 3 years  . CORONARY ANGIOPLASTY  4/03   cutting balloon PTCA pLAD into Diag  . CORONARY ARTERY BYPASS GRAFT  2000   LIMA-LAD, SVG-RCA, SVG-Diag; SVG-Diag & SVG-RCA occluded 2003  . FRACTURE SURGERY    . HAND SURGERY  08/27/09   R thumb procedure wit Scaphoid Gragt and screws, Dr [name]  . NM MYOVIEW LTD  4/11   normal  . PERIPHERAL VASCULAR CATHETERIZATION Right 06/27/2015   Procedure: Lower Extremity  Angiography;  Surgeon: Algernon [name], MD;  Location: [facility];  Service: Cardiovascular;  Laterality: Right;  . PERIPHERAL VASCULAR CATHETERIZATION  06/27/2015   Procedure: Lower Extremity Intervention;  Surgeon: Algernon [name], MD;  Location: [facility];  Service: Cardiovascular;;    Social History   Socioeconomic History  . Marital status: Married    Spouse name: Not on file  . Number of children: 5  . Years of education: Not on file  . Highest education level: Not on file  Occupational History  . Occupation: [employer]    Employer: RETIRED  Social Needs  . Financial resource strain: Not on file  . Food insecurity:    Worry: Not on file    Inability: Not on file  . Transportation needs:    Medical: Not on file    Non-medical: Not on file  Tobacco Use  . Smoking status: Never Smoker  . Smokeless tobacco: Former Network engineer and Sexual Activity  . Alcohol use: No  . Drug use: No  . Sexual activity: Never  Lifestyle  . Physical activity:    Days per week: Not on file    Minutes per session: Not on file  . Stress: Not on file  Relationships  . Social connections:    Talks on phone: Not on file    Gets together: Not on file    Attends religious service: Not on file    Active member of club or organization: Not on file    Attends meetings of clubs or organizations: Not on file    Relationship status: Not on file  . Intimate partner violence:    Fear of current or ex partner: Not on file    Emotionally abused: Not on file    Physically abused: Not on file    Forced sexual activity: Not on file  Other Topics Concern  . Not on file  Social History Narrative   Retired now does some antiques   Retired from Theatre manager and dye work   Married 1951   5 kids    Family History  Problem Relation Age of Onset  . Stroke Father   . Aneurysm Father   . Hypertension Father   . Prostate cancer Brother   . Hypertension Mother   . Lung cancer Brother         smoker  . Thrombosis Brother   . Other Brother        RF valve disorder (smoker)  . Lung cancer Brother        smoker  . Breast cancer Sister   . Liver cancer Brother   . Other Sister        cerebral hemorrhage    Allergies  Allergen Reactions  . Isosorbide Mononitrate Other (See Comments)    Unknown, doesn't remember   .  Ramipril Cough  . Quinidine Gluconate Rash     Review of Systems   Review of Systems: Negative Unless Checked Constitutional: '[]'$ Weight loss  '[]'$ Fever  '[]'$ Chills Cardiac: '[]'$ Chest pain   '[x]'$  Atrial Fibrillation  '[]'$ Palpitations   '[]'$ Shortness of breath when laying flat   '[]'$ Shortness of breath with exertion. '[]'$ Shortness of breath at rest Vascular:  '[]'$ Pain in legs with walking   '[]'$ Pain in legs with standing '[]'$ Pain in legs when laying flat   '[]'$ Claudication    '[]'$ Pain in feet when laying flat    '[]'$ History of DVT   '[]'$ Phlebitis   '[x]'$ Swelling in legs   '[]'$ Varicose veins   '[]'$ Non-healing ulcers Pulmonary:   '[]'$ Uses home oxygen   '[]'$ Productive cough   '[]'$ Hemoptysis   '[]'$ Wheeze  '[]'$ COPD   '[]'$ Asthma Neurologic:  '[]'$ Dizziness   '[]'$ Seizures  '[]'$ Blackouts '[]'$ History of stroke   '[]'$ History of TIA  '[]'$ Aphasia   '[]'$ Temporary Blindness   '[]'$ Weakness or numbness in arm   '[x]'$ Weakness or numbness in leg Musculoskeletal:   '[]'$ Joint swelling   '[]'$ Joint pain   '[]'$ Low back pain  '[]'$  History of Knee Replacement '[]'$ Arthritis '[]'$ back Surgeries  '[]'$  Spinal Stenosis    Hematologic:  '[]'$ Easy bruising  '[]'$ Easy bleeding   '[]'$ Hypercoagulable state   '[]'$ Anemic Gastrointestinal:  '[]'$ Diarrhea   '[]'$ Vomiting  '[]'$ Gastroesophageal reflux/heartburn   '[]'$ Difficulty swallowing. '[]'$ Abdominal pain Genitourinary:  '[]'$ Chronic kidney disease   '[]'$ Difficult urination  '[]'$ Anuric   '[]'$ Blood in urine '[]'$ Frequent urination  '[]'$ Burning with urination   '[]'$ Hematuria Skin:  '[]'$ Rashes   '[]'$ Ulcers '[x]'$ Wounds Psychological:  '[]'$ History of anxiety   '[]'$  History of major depression  '[]'$  Memory Difficulties      OBJECTIVE:   Physical Exam  BP 128/64 (BP Location: Right  Arm, Patient Position: Sitting, Cuff Size: Small)   Pulse (!) 48   Resp 12   Ht 5' 5.5" (1.664 m)   Wt 163 lb (73.9 kg)   BMI 26.71 kg/m   Gen: WD/WN, NAD Head: [place]/AT, No temporalis wasting.  Ear/Nose/Throat: Hearing grossly intact, nares w/o erythema or drainage Eyes: PER, EOMI, sclera nonicteric.  Neck: Supple, no masses.  No JVD.  Pulmonary:  Good air movement, no use of accessory muscles.  Cardiac: RRR Vascular:  Vessel Right Left  Radial Palpable Palpable  Dorsalis Pedis Not Palpable   Posterior Tibial Not Palpable    Gastrointestinal: soft, non-distended. No guarding/no peritoneal signs.  Musculoskeletal: M/S 5/5 throughout. Left Below Knee Amputation Neurologic: Pain and light touch intact in extremities.  Symmetrical.  Speech is fluent. Motor exam as listed above. Psychiatric: Judgment intact, Mood & affect appropriate for pt's clinical situation. Dermatologic: No Venous rashes. No Ulcers Noted.  No changes consistent with cellulitis. Lymph : No Cervical lymphadenopathy, no lichenification or skin changes of chronic lymphedema.       ASSESSMENT AND PLAN:  1. PVD (peripheral vascular disease) ([place]) Due to the fact that the patient has a current amputation and has pre ulcerative changes, I feel that given his non-invasive tests it would be prudent to intervene prior to worsening of the wounds or vascular status.    Recommend:  The patient has evidence of severe atherosclerotic changes of both lower extremities associated with ulceration and tissue loss of the foot.  This represents a limb threatening ischemia and places the patient at the risk for limb loss.  Patient should undergo angiography of the right lower extremities with the hope for intervention for limb salvage.  The risks and benefits as well as the alternative therapies was discussed in  detail with the patient.  All questions were answered.  Patient agrees to proceed with angiography.  The patient will  follow up with me in the office after the procedure.    2. Essential hypertension Continue antihypertensive medications as already ordered, these medications have been reviewed and there are no changes at this time.   3. Hx of BKA, left ([place]) Stump is slightly swollen however well on its way to healing.  Patient advised to use ice for swelling and to elevate.  He should attempt to stay off his stump while it is still sore.    Current Outpatient Medications on File Prior to Visit  Medication Sig Dispense Refill  . acetaminophen (TYLENOL) 325 MG tablet Take 2 tablets (650 mg total) by mouth every 6 (six) hours as needed for mild pain (or temp >/= 101 F).    [name] amLODipine (NORVASC) 10 MG tablet Take 1 tablet by mouth once daily 90 tablet 0  . apixaban (ELIQUIS) 2.5 MG TABS tablet Take 1 tablet (2.5 mg total) by mouth 2 (two) times daily. 180 tablet 1  . B Complex Vitamins (B COMPLEX-B12) TABS Take 1 tablet by mouth daily.     . calcium carbonate (TUMS - DOSED IN MG ELEMENTAL CALCIUM) 500 MG chewable tablet Chew 1 tablet by mouth daily as needed for indigestion or heartburn.    . cholecalciferol (VITAMIN D) 1000 UNITS tablet Take 1,000 Units by mouth 2 (two) times a week. Takes on Mon and Fri    . Cinnamon 500 MG capsule Take 1,000 mg by mouth 2 (two) times daily.     [name] doxycycline (VIBRA-TABS) 100 MG tablet Take 1 tablet (100 mg total) by mouth 2 (two) times daily. 20 tablet 0  . fenofibrate 160 MG tablet Take 1 tablet (160 mg total) by mouth daily. 90 tablet 3  . ferrous sulfate 325 (65 FE) MG EC tablet Take 325 mg by mouth 3 (three) times daily with meals.    . finasteride (PROSCAR) 5 MG tablet TAKE 1 TABLET BY MOUTH ONCE DAILY 90 tablet 3  . fish oil-omega-3 fatty acids 1000 MG capsule Take 1,000 mg by mouth daily.     [name] lisinopril (PRINIVIL,ZESTRIL) 20 MG tablet Take 1 tablet (20 mg total) by mouth daily. 30 tablet 0  . metoprolol tartrate (LOPRESSOR) 25 MG tablet Take 0.5 tablets (12.5 mg  total) by mouth 2 (two) times daily. 90 tablet 3  . Multiple Vitamin (DAILY MULTIVITAMIN PO) Take 1 tablet by mouth daily.      . pentoxifylline (TRENTAL) 400 MG CR tablet TAKE 1 TABLET BY MOUTH THREE TIMES DAILY WITH MEALS 90 tablet 0  . rosuvastatin (CRESTOR) 40 MG tablet Take 1 tablet (40 mg total) by mouth daily. 90 tablet 2  . Blood Glucose Monitoring Suppl (ONE TOUCH ULTRA SYSTEM KIT) W/DEVICE KIT 1 kit by Does not apply route once. 1 each 0  . glucose blood test strip Use as instructed 100 each 3  . ondansetron (ZOFRAN) 4 MG tablet Take 4 mg by mouth every 6 (six) hours as needed for nausea or vomiting.    [name] DELICA LANCETS FINE MISC check sugar once daily 100 each 2  . saccharomyces boulardii (FLORASTOR) 250 MG capsule Take 1 capsule (250 mg total) by mouth daily. 90 capsule 3  . tamsulosin (FLOMAX) 0.4 MG CAPS capsule Take 1 capsule (0.4 mg total) by mouth daily. 90 capsule 3   No current facility-administered medications on file prior to visit.  There are no Patient Instructions on file for this visit. No follow-ups on file.   Kris Hartmann, NP  This note was completed with Sales executive.  Any errors are purely unintentional.

## 2019-01-05 NOTE — Telephone Encounter (Signed)
Patient was called to schedule his leg angio and a message was left for a return call.[name]

## 2019-01-10 NOTE — Telephone Encounter (Signed)
I attempted to contact the patient again and had to leave a message for a return call.

## 2019-01-11 ENCOUNTER — Telehealth: Payer: Self-pay | Admitting: Cardiology

## 2019-01-11 NOTE — Telephone Encounter (Signed)
Virtual Visit Pre-Appointment Phone Call  "(Name), I am calling you today to discuss your upcoming appointment. We are currently trying to limit exposure to the virus that causes COVID-19 by seeing patients at home rather than in the office."  1. "What is the BEST phone number to call the day of the visit?" - include this in appointment notes  2. Do you have or have access to (through a family member/friend) a smartphone with video capability that we can use for your visit?" a. If yes - list this number in appt notes as cell (if different from BEST phone #) and list the appointment type as a VIDEO visit in appointment notes b. If no - list the appointment type as a PHONE visit in appointment notes  3. Confirm consent - "In the setting of the current Covid19 crisis, you are scheduled for a (phone or video) visit with your provider on (date) at (time).  Just as we do with many in-office visits, in order for you to participate in this visit, we must obtain consent.  If you'd like, I can send this to your mychart (if signed up) or email for you to review.  Otherwise, I can obtain your verbal consent now.  All virtual visits are billed to your insurance company just like a normal visit would be.  By agreeing to a virtual visit, we'd like you to understand that the technology does not allow for your provider to perform an examination, and thus may limit your provider's ability to fully assess your condition. If your provider identifies any concerns that need to be evaluated in person, we will make arrangements to do so.  Finally, though the technology is pretty good, we cannot assure that it will always work on either your or our end, and in the setting of a video visit, we may have to convert it to a phone-only visit.  In either situation, we cannot ensure that we have a secure connection.  Are you willing to proceed?" STAFF: Did the patient verbally acknowledge consent to telehealth visit? Document  YES/NO here: Yes  4. Advise patient to be prepared - "Two hours prior to your appointment, go ahead and check your blood pressure, pulse, oxygen saturation, and your weight (if you have the equipment to check those) and write them all down. When your visit starts, your provider will ask you for this information. If you have an Apple Watch or Kardia device, please plan to have heart rate information ready on the day of your appointment. Please have a pen and paper handy nearby the day of the visit as well."  5. Give patient instructions for MyChart download to smartphone OR [email or web address] as below if video visit (depending on what platform provider is using)  6. Inform patient they will receive a phone call 15 minutes prior to their appointment time (may be from unknown caller ID) so they should be prepared to answer    [place]. has been deemed a candidate for a follow-up tele-health visit to limit community exposure during the Covid-19 pandemic. I spoke with the patient via phone to ensure availability of phone/video source, confirm preferred email & phone number, and discuss instructions and expectations.  I reminded Eric [name]. to be prepared with any vital sign and/or heart rhythm information that could potentially be obtained via home monitoring, at the time of his visit. I reminded Eric [name]. to expect a phone  call prior to his visit.  Eric [name] 01/11/2019 10:57 AM

## 2019-01-13 ENCOUNTER — Encounter: Payer: Self-pay | Admitting: Cardiology

## 2019-01-13 ENCOUNTER — Telehealth (INDEPENDENT_AMBULATORY_CARE_PROVIDER_SITE_OTHER): Payer: PPO | Admitting: Cardiology

## 2019-01-13 VITALS — BP 128/88 | HR 51 | Ht 65.0 in | Wt 160.0 lb

## 2019-01-13 DIAGNOSIS — I10 Essential (primary) hypertension: Secondary | ICD-10-CM

## 2019-01-13 DIAGNOSIS — E782 Mixed hyperlipidemia: Secondary | ICD-10-CM

## 2019-01-13 DIAGNOSIS — I2510 Atherosclerotic heart disease of native coronary artery without angina pectoris: Secondary | ICD-10-CM | POA: Diagnosis not present

## 2019-01-13 DIAGNOSIS — I4891 Unspecified atrial fibrillation: Secondary | ICD-10-CM

## 2019-01-13 NOTE — Progress Notes (Signed)
  Medication Instructions:  STOP FENOFIBRATE   Labwork: NONE  Testing/Procedures: NONE  Follow-Up: Your physician wants you to follow-up in: 6 MONTHS. You will receive a reminder letter in the mail two months in advance. If you don't receive a letter, please call our office to schedule the follow-up appointment.   Any Other Special Instructions Will Be Listed Below (If Applicable).     If you need a refill on your cardiac medications before your next appointment, please call your pharmacy.

## 2019-01-13 NOTE — Progress Notes (Signed)
Virtual Visit via Telephone Note   This visit type was conducted due to national recommendations for restrictions regarding the COVID-19 Pandemic (e.g. social distancing) in an effort to limit this patient's exposure and mitigate transmission in our community.  Due to his co-morbid illnesses, this patient is at least at moderate risk for complications without adequate follow up.  This format is felt to be most appropriate for this patient at this time.  The patient did not have access to video technology/had technical difficulties with video requiring transitioning to audio format only (telephone).  All issues noted in this document were discussed and addressed.  No physical exam could be performed with this format.  Please refer to the patient's chart for his  consent to telehealth for [facility].   Date:  01/13/2019   ID:  [name], DOB 11/16/1927, MRN [id number]  Patient Location: Home Provider Location: Home  PCP:  [name], MD  Cardiologist:  Dr Carlyle Dolly Electrophysiologist:  None   Evaluation Performed:  Follow-Up Visit  Chief Complaint:  6 month follow up  History of Present Illness:    Eric [name]. is a 85 y.o. male seen today for follow up of the following medical problems.    1. CAD - CABG 2000 w/ LIMA-LAD, SVG-Diag, SVG-PDA. Subsequent PTCA of LAD in 2003  - denies any chest pain. No SOB/DOE - compliant with meds  2. Permanent afib - no recent palpitations. No lightheadedness or dizziness - no bleeding on eliquis. Cr right abour 1.5, has been on eliquis 2.24m bid dosing  3. CKD III - [facility]  4. DM2 - followed by pcp   5. PAD - history of prior intervention - prior BKA  6. HTN - notes mention some prior orthostatic symptoms. -compliant with meds, no recent symptoms   7. Hyperlipidemia - 03/2018 TC 111 HDL 28 TG 124 LDL 62 - compliant with statin. Has also been on fenofibrate   10/2018 TC 98 HDL 25 TG 120 LDL 52  The patient does not have symptoms concerning for COVID-19 infection (fever, chills, cough, or new shortness of breath).    Past Medical History:  Diagnosis Date  . At[place] fl[place] [place]    s/p RFCA  . CAD (coronary artery disease)    cath 2003, occluded S-RCA, occluded S-Dx, L-LAD ok, s/p PTCA to LAD  . Cataract   . CKD (chronic kidney disease) stage 3, GFR 30-59 ml/min (HCC)   . Diabetes mellitus    diet controlled  . Hearing aid worn    bilateral  . History of kidney stones   . Long term (current) use of anticoagulants   . Neuromuscular disorder ([place]   . OSA (obstructive sleep apnea) 12/11   very mild, AHI 7/hr  . Persistent atrial fibrillation 09/26/2015  . Pure hyperglyceridemia   . PVD (peripheral vascular disease) ([place]    [name]  . Unspecified essential hypertension   . Wears dentures    full upper and lower   Past Surgical History:  Procedure Laterality Date  . ABDOMINAL AORTOGRAM W/LOWER EXTREMITY N/A 11/15/2017   Procedure: ABDOMINAL AORTOGRAM W/LOWER EXTREMITY;  Surgeon: FElam Dutch MD;  [facility];  Service: Cardiovascular;  Laterality: N/A;  . Adenosine Myoview  3/06   EF 56%, neg. Ischemia  . Adenosine Myoview  02/18/07   nml  . AMPUTATION Left 12/15/2017  Procedure: AMPUTATION BELOW KNEE;  Surgeon: Algernon [name], MD;  Location: [facility];  Service: Vascular;  Laterality: Left;  . ANGIOPLASTY  1/99   CAD- diogonal with rotational artherectomy  . Arthrectomy     of LAD & PTCA  . BLEPHAROPLASTY Bilateral   . CARDIAC CATHETERIZATION  1/00  . CARDIOVERSION  1/04  . CARDIOVERSION  5/07   hospital- a flutter  . CARPAL TUNNEL RELEASE     ? bilateral  . CATARACT EXTRACTION W/PHACO Right 07/28/2017   Procedure: CATARACT EXTRACTION PHACO AND INTRAOCULAR LENS PLACEMENT ([place]) RIGHT DIABETIC;  Surgeon: [name], MD;  Location: [place];  Service: Ophthalmology;  Laterality: Right;  Diabetic - diet controlled  . CATARACT EXTRACTION W/PHACO Left 08/18/2017   Procedure: CATARACT EXTRACTION PHACO AND INTRAOCULAR LENS PLACEMENT ([facility]);  Surgeon: [name], MD;  Location: [place];  Service: Ophthalmology;  Laterality: Left;  DIABETES - oral meds  . COLONOSCOPY W/ BIOPSIES  10/01/06   sigmoid polyp bx neg, 3 years  . CORONARY ANGIOPLASTY  4/03   cutting balloon PTCA pLAD into Diag  . CORONARY ARTERY BYPASS GRAFT  2000   LIMA-LAD, SVG-RCA, SVG-Diag; SVG-Diag & SVG-RCA occluded 2003  . FRACTURE SURGERY    . HAND SURGERY  08/27/09   R thumb procedure wit Scaphoid Gragt and screws, Dr [name]  . NM MYOVIEW LTD  4/11   normal  . PERIPHERAL VASCULAR CATHETERIZATION Right 06/27/2015   Procedure: Lower Extremity Angiography;  Surgeon: Algernon [name], MD;  Location: [place] [facility];  Service: Cardiovascular;  Laterality: Right;  . PERIPHERAL VASCULAR CATHETERIZATION  06/27/2015   Procedure: Lower Extremity Intervention;  Surgeon: Algernon [name], MD;  Location: [place] [facility];  Service: Cardiovascular;;     Current Meds  Medication Sig  . acetaminophen (TYLENOL) 325 MG tablet Take 2 tablets (650 mg total) by mouth every 6 (six) hours as needed for mild pain (or temp >/= 101 F).  [name] amLODipine (NORVASC) 10 MG tablet Take 1 tablet by mouth once daily  . apixaban (ELIQUIS) 2.5 MG TABS tablet Take 1 tablet (2.5 mg total) by mouth 2 (two) times daily.  . B Complex Vitamins (B COMPLEX-B12) TABS Take 1 tablet by mouth daily.   . Blood Glucose Monitoring Suppl (ONE TOUCH ULTRA SYSTEM KIT) W/DEVICE KIT 1 kit by Does not apply route once.  . cholecalciferol (VITAMIN D) 1000 UNITS tablet Take 1,000 Units by mouth 2 (two) times a week. Takes on Mon and Fri  . Cinnamon 500 MG capsule Take 1,000 mg by mouth 2 (two) times daily.   . fenofibrate 160 MG tablet Take 1 tablet (160 mg total) by mouth daily.  . ferrous  sulfate 325 (65 FE) MG EC tablet Take 325 mg by mouth 3 (three) times daily with meals.  . finasteride (PROSCAR) 5 MG tablet TAKE 1 TABLET BY MOUTH ONCE DAILY  . fish oil-omega-3 fatty acids 1000 MG capsule Take 1,000 mg by mouth daily.   [name] glucose blood test strip Use as instructed  . lisinopril (PRINIVIL,ZESTRIL) 20 MG tablet Take 1 tablet (20 mg total) by mouth daily.  . metoprolol tartrate (LOPRESSOR) 25 MG tablet Take 0.5 tablets (12.5 mg total) by mouth 2 (two) times daily.  . Multiple Vitamin (DAILY MULTIVITAMIN PO) Take 1 tablet by mouth daily.    [name] DELICA LANCETS FINE MISC check sugar once daily  . pentoxifylline (TRENTAL) 400 MG CR tablet TAKE 1 TABLET BY MOUTH THREE  TIMES DAILY WITH MEALS  . rosuvastatin (CRESTOR) 40 MG tablet Take 1 tablet (40 mg total) by mouth daily.  [name] saccharomyces boulardii (FLORASTOR) 250 MG capsule Take 1 capsule (250 mg total) by mouth daily.  . [DISCONTINUED] calcium carbonate (TUMS - DOSED IN MG ELEMENTAL CALCIUM) 500 MG chewable tablet Chew 1 tablet by mouth daily as needed for indigestion or heartburn.  . [DISCONTINUED] doxycycline (VIBRA-TABS) 100 MG tablet Take 1 tablet (100 mg total) by mouth 2 (two) times daily.  . [DISCONTINUED] tamsulosin (FLOMAX) 0.4 MG CAPS capsule Take 1 capsule (0.4 mg total) by mouth daily.     Allergies:   Isosorbide mononitrate; Ramipril; and Quinidine gluconate   Social History   Tobacco Use  . Smoking status: Never Smoker  . Smokeless tobacco: Former Network engineer Use Topics  . Alcohol use: No  . Drug use: No     Family Hx: The patient's family history includes Aneurysm in his father; Breast cancer in his sister; Hypertension in his father and mother; Liver cancer in his brother; Lung cancer in his brother and brother; Other in his brother and sister; Prostate cancer in his brother; Stroke in his father; Thrombosis in his brother.  ROS:   Please see the history of present illness.     All other  systems reviewed and are negative.   Prior CV studies:   The following studies were reviewed today:  na  Labs/Other Tests and Data Reviewed:    EKG:  No ECG reviewed.  Recent Labs: 10/20/2018: ALT 16; BUN 24; Creat 1.45; Hemoglobin 11.0; Platelets 224; Potassium 5.2; Sodium 141   Recent Lipid Panel Lab Results  Component Value Date/Time   CHOL 98 10/20/2018 08:55 AM   TRIG 120 10/20/2018 08:55 AM   HDL 25 (L) 10/20/2018 08:55 AM   CHOLHDL 3.9 10/20/2018 08:55 AM   LDLCALC 52 10/20/2018 08:55 AM   LDLDIRECT 99.1 02/19/2011 08:23 AM    Wt Readings from Last 3 Encounters:  01/13/19 160 lb (72.6 kg)  01/05/19 163 lb (73.9 kg)  10/24/18 163 lb (73.9 kg)     Objective:    Vital Signs:  BP 128/88   Pulse (!) 51   Ht 5' 5" (1.651 m)   Wt 160 lb (72.6 kg)   BMI 26.63 kg/m    Today's Vitals   01/13/19 0932  BP: 128/88  Pulse: (!) 51  Weight: 160 lb (72.6 kg)  Height: 5' 5" (1.651 m)   Body mass index is 26.63 kg/m.  Normal affect. Normal speech pattern and tone. Comfortable, no apparent distress. No audible signs of SOB or wheezing  ASSESSMENT & PLAN:   1. CAD - doing well without symptoms, continue current meds  2. Afib - no recent symptoms, continue current meds  Renal function somewhat labile, we will continue the eliquis 2.43m bid dosing at this time.   3. HTN - he is at goal, conitnue current meds   4. Hyperlipidemia - LDL at goal. No real clinical benefit to fenofibrate in this setting, we will d/c   COVID-19 Education: The signs and symptoms of COVID-19 were discussed with the patient and how to seek care for testing (follow up with PCP or arrange E-visit).  The importance of social distancing was discussed today.  Time:   Today, I have spent 19 minutes with the patient with telehealth technology discussing the above problems.     Medication Adjustments/Labs and Tests Ordered: Current medicines are reviewed at length with the patient  today.  Concerns regarding medicines are outlined above.   Tests Ordered: No orders of the defined types were placed in this encounter.   Medication Changes: No orders of the defined types were placed in this encounter.   Disposition:  Follow up 6 months  Signed, Carlyle Dolly, MD  01/13/2019 10:54 AM    [place]

## 2019-01-13 NOTE — Addendum Note (Signed)
Addended byDebbora Lacrosse R on: 01/13/2019 11:30 AM   Modules accepted: Orders

## 2019-01-13 NOTE — Telephone Encounter (Signed)
I attempted to return the call to the patient and had to leave a message for a return call.

## 2019-01-16 ENCOUNTER — Encounter (INDEPENDENT_AMBULATORY_CARE_PROVIDER_SITE_OTHER): Payer: Self-pay

## 2019-01-16 ENCOUNTER — Encounter: Payer: Self-pay | Admitting: Family Medicine

## 2019-01-16 NOTE — Telephone Encounter (Signed)
Spoke with the patient on Friday regarding his procedure with Dr. Delana Meyer on 01/25/2019, patient has  8:30 am arrival time and his Covid testing is on 01/20/2019 between 10:30-12:30pm. Pre-procedure instructions as well as zero visitor policy was discussed. The paperwork will be mailed to the patient.

## 2019-01-17 NOTE — Telephone Encounter (Signed)
Spoke with the patient and his wife and gave them his new arrival time of 10:00 am for his procedure on 01/25/2019. We also discussed the patient Covid testing day and time of 01/20/2019 between 10;30-12:30pm.

## 2019-01-18 ENCOUNTER — Other Ambulatory Visit (INDEPENDENT_AMBULATORY_CARE_PROVIDER_SITE_OTHER): Payer: Self-pay | Admitting: Nurse Practitioner

## 2019-01-20 ENCOUNTER — Other Ambulatory Visit: Payer: Self-pay

## 2019-01-20 ENCOUNTER — Other Ambulatory Visit
Admission: RE | Admit: 2019-01-20 | Discharge: 2019-01-20 | Disposition: A | Discharge status: Home / Self Care | Payer: PPO | Source: Ambulatory Visit | Attending: Vascular Surgery | Admitting: Vascular Surgery

## 2019-01-20 ENCOUNTER — Other Ambulatory Visit: Admission: RE | Admit: 2019-01-20 | Payer: PPO | Source: Home / Self Care | Admitting: *Deleted

## 2019-01-20 DIAGNOSIS — Z1159 Encounter for screening for other viral diseases: Secondary | ICD-10-CM | POA: Diagnosis not present

## 2019-01-20 DIAGNOSIS — Z01812 Encounter for preprocedural laboratory examination: Secondary | ICD-10-CM | POA: Diagnosis not present

## 2019-01-21 LAB — NOVEL CORONAVIRUS, NAA (HOSP ORDER, SEND-OUT TO REF LAB; TAT 18-24 HRS): SARS-CoV-2, NAA: NOT DETECTED

## 2019-01-24 MED ORDER — CEFAZOLIN SODIUM-DEXTROSE 2-4 GM/100ML-% IV SOLN
2.0000 g | Freq: Once | INTRAVENOUS | Status: AC
  Administered 2019-01-25: 2 g via INTRAVENOUS

## 2019-01-25 ENCOUNTER — Other Ambulatory Visit: Payer: Self-pay

## 2019-01-25 ENCOUNTER — Ambulatory Visit
Admission: RE | Admit: 2019-01-25 | Discharge: 2019-01-25 | Disposition: A | Discharge status: Home / Self Care | Payer: PPO | Attending: Vascular Surgery | Admitting: Vascular Surgery

## 2019-01-25 ENCOUNTER — Encounter
Admission: RE | Disposition: A | Discharge status: Home / Self Care | Payer: Self-pay | Source: Home / Self Care | Attending: Vascular Surgery

## 2019-01-25 DIAGNOSIS — S88112D Complete traumatic amputation at level between knee and ankle, left lower leg, subsequent encounter: Secondary | ICD-10-CM | POA: Diagnosis not present

## 2019-01-25 DIAGNOSIS — E1151 Type 2 diabetes mellitus with diabetic peripheral angiopathy without gangrene: Secondary | ICD-10-CM | POA: Insufficient documentation

## 2019-01-25 DIAGNOSIS — E11621 Type 2 diabetes mellitus with foot ulcer: Secondary | ICD-10-CM | POA: Insufficient documentation

## 2019-01-25 DIAGNOSIS — Z7901 Long term (current) use of anticoagulants: Secondary | ICD-10-CM | POA: Diagnosis not present

## 2019-01-25 DIAGNOSIS — L97519 Non-pressure chronic ulcer of other part of right foot with unspecified severity: Secondary | ICD-10-CM | POA: Insufficient documentation

## 2019-01-25 DIAGNOSIS — I4819 Other persistent atrial fibrillation: Secondary | ICD-10-CM | POA: Insufficient documentation

## 2019-01-25 DIAGNOSIS — G4733 Obstructive sleep apnea (adult) (pediatric): Secondary | ICD-10-CM | POA: Insufficient documentation

## 2019-01-25 DIAGNOSIS — Z89512 Acquired absence of left leg below knee: Secondary | ICD-10-CM | POA: Diagnosis not present

## 2019-01-25 DIAGNOSIS — Z79899 Other long term (current) drug therapy: Secondary | ICD-10-CM | POA: Diagnosis not present

## 2019-01-25 DIAGNOSIS — I4892 Unspecified atrial flutter: Secondary | ICD-10-CM | POA: Diagnosis not present

## 2019-01-25 DIAGNOSIS — I70299 Other atherosclerosis of native arteries of extremities, unspecified extremity: Secondary | ICD-10-CM

## 2019-01-25 DIAGNOSIS — I129 Hypertensive chronic kidney disease with stage 1 through stage 4 chronic kidney disease, or unspecified chronic kidney disease: Secondary | ICD-10-CM | POA: Insufficient documentation

## 2019-01-25 DIAGNOSIS — L97909 Non-pressure chronic ulcer of unspecified part of unspecified lower leg with unspecified severity: Secondary | ICD-10-CM

## 2019-01-25 DIAGNOSIS — I2510 Atherosclerotic heart disease of native coronary artery without angina pectoris: Secondary | ICD-10-CM | POA: Insufficient documentation

## 2019-01-25 DIAGNOSIS — E1122 Type 2 diabetes mellitus with diabetic chronic kidney disease: Secondary | ICD-10-CM | POA: Diagnosis not present

## 2019-01-25 DIAGNOSIS — N183 Chronic kidney disease, stage 3 (moderate): Secondary | ICD-10-CM | POA: Diagnosis not present

## 2019-01-25 DIAGNOSIS — I70235 Atherosclerosis of native arteries of right leg with ulceration of other part of foot: Secondary | ICD-10-CM | POA: Diagnosis not present

## 2019-01-25 DIAGNOSIS — I70203 Unspecified atherosclerosis of native arteries of extremities, bilateral legs: Secondary | ICD-10-CM | POA: Insufficient documentation

## 2019-01-25 HISTORY — PX: LOWER EXTREMITY ANGIOGRAPHY: CATH118251

## 2019-01-25 LAB — CREATININE, SERUM
Creatinine, Ser: 1.27 mg/dL — ABNORMAL HIGH (ref 0.61–1.24)
GFR calc Af Amer: 58 mL/min — ABNORMAL LOW (ref >60)
GFR calc non Af Amer: 50 mL/min — ABNORMAL LOW (ref >60)

## 2019-01-25 LAB — GLUCOSE, CAPILLARY
Glucose-Capillary: 80 mg/dL (ref 70–99)
Glucose-Capillary: 81 mg/dL (ref 70–99)

## 2019-01-25 LAB — BUN: BUN: 24 mg/dL — ABNORMAL HIGH (ref 8–23)

## 2019-01-25 SURGERY — LOWER EXTREMITY ANGIOGRAPHY
Anesthesia: Moderate Sedation | Laterality: Right

## 2019-01-25 MED ORDER — MIDAZOLAM HCL 2 MG/2ML IJ SOLN
INTRAMUSCULAR | Status: DC | PRN
  Administered 2019-01-25: 2 mg via INTRAVENOUS

## 2019-01-25 MED ORDER — FAMOTIDINE 20 MG PO TABS: 40.0000 mg | ORAL_TABLET | Freq: Once | ORAL | Status: DC | PRN

## 2019-01-25 MED ORDER — ACETAMINOPHEN 325 MG PO TABS: 650.0000 mg | ORAL_TABLET | ORAL | Status: DC | PRN

## 2019-01-25 MED ORDER — METHYLPREDNISOLONE SODIUM SUCC 125 MG IJ SOLR: 125.0000 mg | Freq: Once | INTRAMUSCULAR | Status: DC | PRN

## 2019-01-25 MED ORDER — SODIUM CHLORIDE 0.9 % IV SOLN
INTRAVENOUS | Status: DC
  Administered 2019-01-25: 09:00:00 via INTRAVENOUS

## 2019-01-25 MED ORDER — MIDAZOLAM HCL 5 MG/5ML IJ SOLN
INTRAMUSCULAR | Status: AC
  Filled 2019-01-25: qty 5

## 2019-01-25 MED ORDER — LIDOCAINE-EPINEPHRINE (PF) 1 %-1:200000 IJ SOLN
INTRAMUSCULAR | Status: AC
  Filled 2019-01-25: qty 10

## 2019-01-25 MED ORDER — MIDAZOLAM HCL 2 MG/ML PO SYRP: 8.0000 mg | ORAL_SOLUTION | Freq: Once | ORAL | Status: DC | PRN

## 2019-01-25 MED ORDER — SODIUM CHLORIDE 0.9 % IV SOLN: 250.0000 mL | INTRAVENOUS | Status: DC | PRN

## 2019-01-25 MED ORDER — SODIUM CHLORIDE 0.9% FLUSH: 3.0000 mL | Freq: Two times a day (BID) | INTRAVENOUS | Status: DC

## 2019-01-25 MED ORDER — ONDANSETRON HCL 4 MG/2ML IJ SOLN: 4.0000 mg | Freq: Four times a day (QID) | INTRAMUSCULAR | Status: DC | PRN

## 2019-01-25 MED ORDER — MORPHINE SULFATE (PF) 4 MG/ML IV SOLN: 2.0000 mg | INTRAVENOUS | Status: DC | PRN

## 2019-01-25 MED ORDER — SODIUM CHLORIDE 0.9 % IV SOLN: INTRAVENOUS | Status: DC

## 2019-01-25 MED ORDER — LABETALOL HCL 5 MG/ML IV SOLN: 10.0000 mg | INTRAVENOUS | Status: DC | PRN

## 2019-01-25 MED ORDER — HYDROMORPHONE HCL 1 MG/ML IJ SOLN: 1.0000 mg | Freq: Once | INTRAMUSCULAR | Status: DC | PRN

## 2019-01-25 MED ORDER — HEPARIN SODIUM (PORCINE) 1000 UNIT/ML IJ SOLN
INTRAMUSCULAR | Status: DC | PRN
  Administered 2019-01-25: 5000 [IU] via INTRAVENOUS

## 2019-01-25 MED ORDER — FENTANYL CITRATE (PF) 100 MCG/2ML IJ SOLN
INTRAMUSCULAR | Status: DC | PRN
  Administered 2019-01-25: 50 ug via INTRAVENOUS

## 2019-01-25 MED ORDER — SODIUM CHLORIDE 0.9% FLUSH: 3.0000 mL | INTRAVENOUS | Status: DC | PRN

## 2019-01-25 MED ORDER — DIPHENHYDRAMINE HCL 50 MG/ML IJ SOLN: 50.0000 mg | Freq: Once | INTRAMUSCULAR | Status: DC | PRN

## 2019-01-25 MED ORDER — FENTANYL CITRATE (PF) 100 MCG/2ML IJ SOLN
INTRAMUSCULAR | Status: AC
  Filled 2019-01-25: qty 2

## 2019-01-25 MED ORDER — HEPARIN SODIUM (PORCINE) 1000 UNIT/ML IJ SOLN
INTRAMUSCULAR | Status: AC
  Filled 2019-01-25: qty 1

## 2019-01-25 MED ORDER — OXYCODONE HCL 5 MG PO TABS: 5.0000 mg | ORAL_TABLET | ORAL | Status: DC | PRN

## 2019-01-25 MED ORDER — HYDRALAZINE HCL 20 MG/ML IJ SOLN: 5.0000 mg | INTRAMUSCULAR | Status: DC | PRN

## 2019-01-25 SURGICAL SUPPLY — 10 items
CATH PIG 70CM (CATHETERS) ×2 IMPLANT
DEVICE STARCLOSE SE CLOSURE (Vascular Products) ×2 IMPLANT
GLIDEWIRE ADV .035X260CM (WIRE) ×2 IMPLANT
NDL ENTRY 21GA 7CM ECHOTIP (NEEDLE) IMPLANT
NEEDLE ENTRY 21GA 7CM ECHOTIP (NEEDLE) ×3 IMPLANT
PACK ANGIOGRAPHY (CUSTOM PROCEDURE TRAY) ×3 IMPLANT
SET INTRO CAPELLA COAXIAL (SET/KITS/TRAYS/PACK) ×2 IMPLANT
SHEATH BRITE TIP 5FRX11 (SHEATH) ×2 IMPLANT
TUBING CONTRAST HIGH PRESS 72 (TUBING) ×2 IMPLANT
WIRE J 3MM .035X145CM (WIRE) ×2 IMPLANT

## 2019-01-25 NOTE — Progress Notes (Signed)
Left groin injected with lido-epi by Steffanie Dunn. Np apparent bleeding post. Dressed with gauze and tegaderm.

## 2019-01-25 NOTE — H&P (Signed)
[facility] History & Physical Update  The patient was interviewed and re-examined.  The patient's previous History and Physical has been reviewed and is unchanged.  There is no change in the plan of care. We plan to proceed with the scheduled procedure.  Hortencia Pilar, MD  01/25/2019, 9:49 AM

## 2019-01-25 NOTE — Discharge Instructions (Signed)
Angiogram, Care After °This sheet gives you information about how to care for yourself after your procedure. Your doctor may also give you more specific instructions. If you have problems or questions, contact your doctor. °Follow these instructions at home: °Insertion site care °· Follow instructions from your doctor about how to take care of your long, thin tube (catheter) insertion area. Make sure you: °? Wash your hands with soap and water before you change your bandage (dressing). If you cannot use soap and water, use hand sanitizer. °? Change your bandage as told by your doctor. °? Leave stitches (sutures), skin glue, or skin tape (adhesive) strips in place. They may need to stay in place for 2 weeks or longer. If tape strips get loose and curl up, you may trim the loose edges. Do not remove tape strips completely unless your doctor says it is okay. °· Do not take baths, swim, or use a hot tub until your doctor says it is okay. °· You may shower 24-48 hours after the procedure or as told by your doctor. °? Gently wash the area with plain soap and water. °? Pat the area dry with a clean towel. °? Do not rub the area. This may cause bleeding. °· Do not apply powder or lotion to the area. Keep the area clean and dry. °· Check your insertion area every day for signs of infection. Check for: °? More redness, swelling, or pain. °? Fluid or blood. °? Warmth. °? Pus or a bad smell. °Activity °· Rest as told by your doctor, usually for 1-2 days. °· Do not lift anything that is heavier than 10 lbs. (4.5 kg) or as told by your doctor. °· Do not drive for 24 hours if you were given a medicine to help you relax (sedative). °· Do not drive or use heavy machinery while taking prescription pain medicine. °General instructions ° °· Go back to your normal activities as told by your doctor, usually in about a week. Ask your doctor what activities are safe for you. °· If the insertion area starts to bleed, lie flat and put  pressure on the area. If the bleeding does not stop, get help right away. This is an emergency. °· Drink enough fluid to keep your pee (urine) clear or pale yellow. °· Take over-the-counter and prescription medicines only as told by your doctor. °· Keep all follow-up visits as told by your doctor. This is important. °Contact a doctor if: °· You have a fever. °· You have chills. °· You have more redness, swelling, or pain around your insertion area. °· You have fluid or blood coming from your insertion area. °· The insertion area feels warm to the touch. °· You have pus or a bad smell coming from your insertion area. °· You have more bruising around the insertion area. °· Blood collects in the tissue around the insertion area (hematoma) that may be painful to the touch. °Get help right away if: °· You have a lot of pain in the insertion area. °· The insertion area swells very fast. °· The insertion area is bleeding, and the bleeding does not stop after holding steady pressure on the area. °· The area near or just beyond the insertion area becomes pale, cool, tingly, or numb. °These symptoms may be an emergency. Do not wait to see if the symptoms will go away. Get medical help right away. Call your local emergency services (911 in the [place] Do not drive yourself to the hospital. °  Summary °· After the procedure, it is common to have bruising and tenderness at the long, thin tube insertion area. °· After the procedure, it is important to rest and drink plenty of fluids. °· Do not take baths, swim, or use a hot tub until your doctor says it is okay to do so. You may shower 24-48 hours after the procedure or as told by your doctor. °· If the insertion area starts to bleed, lie flat and put pressure on the area. If the bleeding does not stop, get help right away. This is an emergency. °This information is not intended to replace advice given to you by your health care provider. Make sure you discuss any questions you have  with your health care provider. °Document Released: 10/23/2008 Document Revised: 07/21/2016 Document Reviewed: 07/21/2016 °Elsevier Interactive Patient Education © 2019 [employer] ° ° ° °Moderate Conscious Sedation, Adult, Care After °These instructions provide you with information about caring for yourself after your procedure. Your health care provider may also give you more specific instructions. Your treatment has been planned according to current medical practices, but problems sometimes occur. Call your health care provider if you have any problems or questions after your procedure. °What can I expect after the procedure? °After your procedure, it is common: °· To feel sleepy for several hours. °· To feel clumsy and have poor balance for several hours. °· To have poor judgment for several hours. °· To vomit if you eat too soon. °Follow these instructions at home: °For at least 24 hours after the procedure: ° °· Do not: °? Participate in activities where you could fall or become injured. °? Drive. °? Use heavy machinery. °? Drink alcohol. °? Take sleeping pills or medicines that cause drowsiness. °? Make important decisions or sign legal documents. °? Take care of children on your own. °· Rest. °Eating and drinking °· Follow the diet recommended by your health care provider. °· If you vomit: °? Drink water, juice, or soup when you can drink without vomiting. °? Make sure you have little or no nausea before eating solid foods. °General instructions °· Have a responsible adult stay with you until you are awake and alert. °· Take over-the-counter and prescription medicines only as told by your health care provider. °· If you smoke, do not smoke without supervision. °· Keep all follow-up visits as told by your health care provider. This is important. °Contact a health care provider if: °· You keep feeling nauseous or you keep vomiting. °· You feel light-headed. °· You develop a rash. °· You have a fever. °Get  help right away if: °· You have trouble breathing. °This information is not intended to replace advice given to you by your health care provider. Make sure you discuss any questions you have with your health care provider. °Document Released: 05/17/2013 Document Revised: 12/30/2015 Document Reviewed: 11/16/2015 °Elsevier Interactive Patient Education © 2019 Elsevier Inc. ° °

## 2019-01-25 NOTE — Progress Notes (Signed)
Dr. Delana Meyer informed of oozing left groin site. Per Dr. Delana Meyer hold pressure for 15 mins then apply PAD, site will be injected later if needed. PAD with 40 ml's of air applied.

## 2019-01-25 NOTE — Progress Notes (Signed)
Patient restarted bleeding once he began to dress. Pressure bandage was applied and in/out cath was done to alleviate urination need. 500 ml of urine was removed, soon after bleeding from groin ceased. Verbal orders from Dr. Delana Meyer placed in system.

## 2019-01-25 NOTE — Op Note (Signed)
[name]  Percutaneous Study/Intervention Procedural Note   Date of Surgery: 01/25/2019,12:14 PM  Surgeon:Lycan Davee, Dolores Lory   Pre-operative Diagnosis: Atherosclerotic occlusive disease bilateral lower extremities with ulcerations of the right foot; he is status post left below-knee amputation  Post-operative diagnosis:  Same  Procedure(s) Performed:  1.  Abdominal aortogram  2.  Right lower extremity distal runoff third order catheter placement  3.  Star close left common femoral    Anesthesia: Conscious sedation was administered by the interventional radiology RN under my direct supervision. IV Versed plus fentanyl were utilized. Continuous ECG, pulse oximetry and blood pressure was monitored throughout the entire procedure.  Conscious sedation was administered for a total of 25 minutes.  Sheath: 5 French 11 cm sheath retrograde left common femoral  Contrast: 65 cc   Fluoroscopy Time: 9.9 minutes  Indications:  The patient presents to [facility] with ulcerations to the right foot and heel.  Pedal pulses are nonpalpable bilaterally suggesting atherosclerotic occlusive disease.  The risks and benefits as well as alternative therapies for lower extremity revascularization are reviewed with the patient all questions are answered the patient agrees to proceed.  The patient is therefore undergoing angiography with the hope for intervention for limb salvage.   Procedure:  Eric Mckayis a 85 y.o. male who was identified and appropriate procedural time out was performed.  The patient was then placed supine on the table and prepped and draped in the usual sterile fashion.  Ultrasound was used to evaluate the left common femoral artery.  It was echolucent and pulsatile indicating it is patent .  An ultrasound image was acquired for the permanent record.  A micropuncture needle was used to access the left common femoral artery under direct ultrasound  guidance.  The microwire was then advanced under fluoroscopic guidance without difficulty followed by the micro-sheath.  A 0.035 J wire was advanced without resistance and a 5Fr sheath was placed.    Pigtail catheter was then advanced to the level of T12 and AP projection of the aorta was obtained. Pigtail catheter was then repositioned to above the bifurcation and LAO view of the pelvis was obtained. Stiff angled Glidewire and pigtail catheter was then used across the bifurcation and the catheter was positioned in the distal external iliac artery.  RAO of the right groin was then obtained. Wire was reintroduced and negotiated into the SFA and the catheter was advanced into the SFA. Distal runoff was then performed.  After review of the images the catheter was removed over wire and an LAO view of the groin was obtained. StarClose device was deployed without difficulty.   Findings:   Aortogram: Abdominal aorta is opacified with a bolus injection of contrast.  There are no hemodynamically significant lesions.  Single renal arteries are noted bilaterally there does not appear to be any hemodynamically significant ostial stenosis.  The bilateral common internal and external iliac arteries are widely patent.  Right Lower Extremity: The right common femoral profunda femoris are widely patent with minimal atherosclerotic changes.  SFA becomes somewhat irregular and there are several 30 to 40% stenoses but these are not hemodynamically significant.  Popliteal is patent as well.  Trifurcation demonstrates extensive disease the origin of the anterior tibial cannot be identified in the anterior tibial appears occluded throughout its entire course.  The tibioperoneal trunk is moderately diseased as is the peroneal and posterior tibial arteries.  The peroneal does not collateralize across the ankle.  The posterior  tibial occludes at the level of the medial malleolus with a large collateral branch that reconstitutes the  dorsalis pedis.  There is nonvisualization of the medial and lateral plantars.  Left Lower Extremity: The common femoral and visualized portions of the SFA and profunda femoris are patent.  Summary: At this point there is a possibility for a popliteal to dorsalis pedis bypass.  I will investigate whether saphenous vein exists for this bypass.  Were his ulcerations to continue to worsen to the point that he is at threat for amputation and consideration for pedal access with a retrograde approach to recanalizing the anterior tibial could be made however based on the anatomy of visualized this would be a very difficult situation.   Disposition: Patient was taken to the recovery room in stable condition having tolerated the procedure well.  Eric [name] 01/25/2019,12:14 PM

## 2019-01-27 ENCOUNTER — Other Ambulatory Visit: Payer: Self-pay | Admitting: Family Medicine

## 2019-01-27 DIAGNOSIS — S88112D Complete traumatic amputation at level between knee and ankle, left lower leg, subsequent encounter: Secondary | ICD-10-CM | POA: Diagnosis not present

## 2019-02-08 NOTE — Progress Notes (Signed)
MRN : [id number]  Eric [name]. is a 85 y.o. ([birth year]) male who presents with chief complaint of No chief complaint on file. [name]  History of Present Illness:   The patient returns to the office for followup and review status post angiogram without intervention on 01/25/2019. The patient notes the lower extremity symptoms are unchanged. No interval shortening of the patient's claudication distance or rest pain symptoms. Previous wounds about the same.  No new ulcers or wounds have occurred since the last visit.  There have been no significant changes to the patient's overall health care.  The patient denies amaurosis fugax or recent TIA symptoms. There are no recent neurological changes noted. The patient denies history of DVT, PE or superficial thrombophlebitis. The patient denies recent episodes of angina or shortness of breath.     No outpatient medications have been marked as taking for the 02/09/19 encounter (Appointment) with Delana Meyer, Dolores Lory, MD.    Past Medical History:  Diagnosis Date   Atrial flutter [facility])    s/p RFCA   CAD (coronary artery disease)    cath 2003, occluded S-RCA, occluded S-Dx, L-LAD ok, s/p PTCA to LAD   Cataract    CKD (chronic kidney disease) stage 3, GFR 30-59 ml/min (HCC)    Diabetes mellitus    diet controlled   Hearing aid worn    bilateral   History of kidney stones    Long term (current) use of anticoagulants    Neuromuscular disorder ([place])    OSA (obstructive sleep apnea) 12/11   very mild, AHI 7/hr   Persistent atrial fibrillation 09/26/2015   Pure hyperglyceridemia    PVD (peripheral vascular disease) ([place])    angioplasty of his right lower extremity in [place] by Dr.[name] 2013   Unspecified essential hypertension    Wears dentures    full upper and lower    Past Surgical History:  Procedure Laterality Date   ABDOMINAL AORTOGRAM W/LOWER EXTREMITY N/A 11/15/2017   Procedure: ABDOMINAL AORTOGRAM W/LOWER  EXTREMITY;  Surgeon: [name], MD;  Location: [place] [facility];  Service: Cardiovascular;  Laterality: N/A;   Adenosine Myoview  3/06   EF 56%, neg. Ischemia   Adenosine Myoview  02/18/07   nml   AMPUTATION Left 12/15/2017   Procedure: AMPUTATION BELOW KNEE;  Surgeon: Algernon [name], MD;  Location: [facility];  Service: Vascular;  Laterality: Left;   ANGIOPLASTY  1/99   CAD- diogonal with rotational artherectomy   Arthrectomy     of LAD & PTCA   BLEPHAROPLASTY Bilateral    CARDIAC CATHETERIZATION  1/00   CARDIOVERSION  1/04   CARDIOVERSION  5/07   hospital- a flutter   CARPAL TUNNEL RELEASE     ? bilateral   CATARACT EXTRACTION W/PHACO Right 07/28/2017   Procedure: CATARACT EXTRACTION PHACO AND INTRAOCULAR LENS PLACEMENT ([place]) RIGHT DIABETIC;  Surgeon: [name], MD;  Location: [place];  Service: Ophthalmology;  Laterality: Right;  Diabetic - diet controlled   CATARACT EXTRACTION W/PHACO Left 08/18/2017   Procedure: CATARACT EXTRACTION PHACO AND INTRAOCULAR LENS PLACEMENT ([facility]);  Surgeon: [name], MD;  Location: [place];  Service: Ophthalmology;  Laterality: Left;  DIABETES - oral meds   COLONOSCOPY W/ BIOPSIES  10/01/06   sigmoid polyp bx neg, 3 years   CORONARY ANGIOPLASTY  4/03   cutting balloon PTCA pLAD into Diag   CORONARY ARTERY BYPASS GRAFT  2000   LIMA-LAD, SVG-RCA, SVG-Diag; SVG-Diag & SVG-RCA occluded  2003   FRACTURE SURGERY     HAND SURGERY  08/27/09   R thumb procedure wit Scaphoid Gragt and screws, Dr [name]   LOWER EXTREMITY ANGIOGRAPHY Right 01/25/2019   Procedure: LOWER EXTREMITY ANGIOGRAPHY;  Surgeon: Katha Cabal, MD;  Location: [place] [facility];  Service: Cardiovascular;  Laterality: Right;   NM MYOVIEW LTD  4/11   normal   PERIPHERAL VASCULAR CATHETERIZATION Right 06/27/2015   Procedure: Lower Extremity Angiography;  Surgeon: Algernon [name], MD;  Location: [place] [facility];  Service:  Cardiovascular;  Laterality: Right;   PERIPHERAL VASCULAR CATHETERIZATION  06/27/2015   Procedure: Lower Extremity Intervention;  Surgeon: Algernon [name], MD;  Location: [place] [facility];  Service: Cardiovascular;;    Social History Social History   Tobacco Use   Smoking status: Never Smoker   Smokeless tobacco: Former Network engineer Use Topics   Alcohol use: No   Drug use: No    Family History Family History  Problem Relation Age of Onset   Stroke Father    Aneurysm Father    Hypertension Father    Prostate cancer Brother    Hypertension Mother    Lung cancer Brother        smoker   Thrombosis Brother    Other Brother        RF valve disorder (smoker)   Lung cancer Brother        smoker   Breast cancer Sister    Liver cancer Brother    Other Sister        cerebral hemorrhage    Allergies  Allergen Reactions   Isosorbide Mononitrate Other (See Comments)    Unknown, doesn't remember    Ramipril Cough   Quinidine Gluconate Rash     REVIEW OF SYSTEMS (Negative unless checked)  Constitutional: [] Weight loss  [] Fever  [] Chills Cardiac: [] Chest pain   [] Chest pressure   [] Palpitations   [] Shortness of breath when laying flat   [] Shortness of breath with exertion. Vascular:  [] Pain in legs with walking   [] Pain in legs at rest  [] History of DVT   [] Phlebitis   [x] Swelling in legs   [] Varicose veins   [] Non-healing ulcers Pulmonary:   [] Uses home oxygen   [] Productive cough   [] Hemoptysis   [] Wheeze  [] COPD   [] Asthma Neurologic:  [] Dizziness   [] Seizures   [] History of stroke   [] History of TIA  [] Aphasia   [] Vissual changes   [] Weakness or numbness in arm   [] Weakness or numbness in leg Musculoskeletal:   [] Joint swelling   [] Joint pain   [] Low back pain Hematologic:  [] Easy bruising  [] Easy bleeding   [] Hypercoagulable state   [] Anemic Gastrointestinal:  [] Diarrhea   [] Vomiting  [] Gastroesophageal reflux/heartburn   [] Difficulty  swallowing. Genitourinary:  [] Chronic kidney disease   [] Difficult urination  [] Frequent urination   [] Blood in urine Skin:  [] Rashes   [] Ulcers  Psychological:  [] History of anxiety   []  History of major depression.  Physical Examination  There were no vitals filed for this visit. There is no height or weight on file to calculate BMI. Gen: WD/WN, NAD Head: [place]/AT, No temporalis wasting.  Ear/Nose/Throat: Hearing grossly intact, nares w/o erythema or drainage Eyes: PER, EOMI, sclera nonicteric.  Neck: Supple, no large masses.   Pulmonary:  Good air movement, no audible wheezing bilaterally, no use of accessory muscles.  Cardiac: RRR, no JVD Vascular: scattered varicosities present bilaterally.  Mild venous stasis changes to the  legs bilaterally.  2+ soft pitting edema Vessel Right Left  Radial Palpable Palpable  Gastrointestinal: Non-distended. No guarding/no peritoneal signs.  Musculoskeletal: M/S 5/5 throughout.  No deformity or atrophy.  Neurologic: CN 2-12 intact. Symmetrical.  Speech is fluent. Motor exam as listed above. Psychiatric: Judgment intact, Mood & affect appropriate for pt's clinical situation. Dermatologic: Venous rashes no ulcers noted.  No changes consistent with cellulitis. Lymph : No lichenification or skin changes of chronic lymphedema.  CBC Lab Results  Component Value Date   WBC 10.1 10/20/2018   HGB 11.0 (L) 10/20/2018   HCT 34.5 (L) 10/20/2018   MCV 94.3 10/20/2018   PLT 224 10/20/2018    BMET    Component Value Date/Time   NA 141 10/20/2018 0855   NA 139 04/28/2012 0658   K 5.2 10/20/2018 0855   K 4.2 04/28/2012 0658   CL 109 10/20/2018 0855   CL 106 04/28/2012 0658   CO2 25 10/20/2018 0855   CO2 25 04/28/2012 0658   GLUCOSE 81 10/20/2018 0855   GLUCOSE 114 (H) 04/28/2012 0658   BUN 24 (H) 01/25/2019 0903   BUN 32 (H) 04/28/2012 0658   CREATININE 1.27 (H) 01/25/2019 0903   CREATININE 1.45 (H) 10/20/2018 0855   CALCIUM 9.6 10/20/2018 0855    CALCIUM 8.9 04/28/2012 0658   GFRNONAA 50 (L) 01/25/2019 0903   GFRNONAA 44 (L) 01/26/2018 1221   GFRAA 58 (L) 01/25/2019 0903   GFRAA 52 (L) 01/26/2018 1221   Estimated Creatinine Clearance: 38 mL/min (A) (by C-G formula based on SCr of 1.27 mg/dL (H)).  COAG Lab Results  Component Value Date   INR 1.11 12/15/2017   INR 1.46 12/13/2017   INR 1.27 11/15/2017   PROTIME 19.9 01/10/2009    Radiology No results found.   Assessment/Plan 1. Atherosclerosis of native arteries of the extremities with ulceration ([place])  Recommend:  The patient has evidence of atherosclerosis of the lower extremities with claudication.  The patient does not voice lifestyle limiting changes at this point in time.  Noninvasive studies do not suggest clinically significant change.  No invasive studies, angiography or surgery at this time The patient should continue walking and begin a more formal exercise program.  The patient should continue antiplatelet therapy and aggressive treatment of the lipid abnormalities  No changes in the patient's medications at this time  The patient should continue wearing graduated compression socks 10-15 mmHg strength to control the mild edema.    2. Coronary artery disease due to lipid rich plaque Continue cardiac and antihypertensive medications as already ordered and reviewed, no changes at this time.  Continue statin as ordered and reviewed, no changes at this time  Nitrates PRN for chest pain   3. Persistent atrial fibrillation Continue antiarrhythmia medications as already ordered, these medications have been reviewed and there are no changes at this time.  Continue anticoagulation as ordered by Cardiology Service   4. Essential hypertension Continue antihypertensive medications as already ordered, these medications have been reviewed and there are no changes at this time.     Hortencia Pilar, MD  02/08/2019 8:27 AM

## 2019-02-09 ENCOUNTER — Ambulatory Visit (INDEPENDENT_AMBULATORY_CARE_PROVIDER_SITE_OTHER): Payer: PPO | Admitting: Vascular Surgery

## 2019-02-09 ENCOUNTER — Encounter (INDEPENDENT_AMBULATORY_CARE_PROVIDER_SITE_OTHER): Payer: Self-pay | Admitting: Vascular Surgery

## 2019-02-09 ENCOUNTER — Other Ambulatory Visit: Payer: Self-pay

## 2019-02-09 VITALS — BP 134/54 | HR 60 | Resp 15 | Ht 65.0 in | Wt 168.8 lb

## 2019-02-09 DIAGNOSIS — I2510 Atherosclerotic heart disease of native coronary artery without angina pectoris: Secondary | ICD-10-CM | POA: Diagnosis not present

## 2019-02-09 DIAGNOSIS — I4819 Other persistent atrial fibrillation: Secondary | ICD-10-CM | POA: Diagnosis not present

## 2019-02-09 DIAGNOSIS — Z79899 Other long term (current) drug therapy: Secondary | ICD-10-CM | POA: Diagnosis not present

## 2019-02-09 DIAGNOSIS — I2583 Coronary atherosclerosis due to lipid rich plaque: Secondary | ICD-10-CM

## 2019-02-09 DIAGNOSIS — I70213 Atherosclerosis of native arteries of extremities with intermittent claudication, bilateral legs: Secondary | ICD-10-CM | POA: Diagnosis not present

## 2019-02-09 DIAGNOSIS — I10 Essential (primary) hypertension: Secondary | ICD-10-CM

## 2019-02-09 DIAGNOSIS — I7025 Atherosclerosis of native arteries of other extremities with ulceration: Secondary | ICD-10-CM

## 2019-02-24 ENCOUNTER — Other Ambulatory Visit: Payer: Self-pay | Admitting: Family Medicine

## 2019-02-24 DIAGNOSIS — S88112D Complete traumatic amputation at level between knee and ankle, left lower leg, subsequent encounter: Secondary | ICD-10-CM | POA: Diagnosis not present

## 2019-02-26 ENCOUNTER — Encounter (INDEPENDENT_AMBULATORY_CARE_PROVIDER_SITE_OTHER): Payer: Self-pay | Admitting: Vascular Surgery

## 2019-03-01 DIAGNOSIS — N3020 Other chronic cystitis without hematuria: Secondary | ICD-10-CM | POA: Diagnosis not present

## 2019-03-01 DIAGNOSIS — N401 Enlarged prostate with lower urinary tract symptoms: Secondary | ICD-10-CM | POA: Diagnosis not present

## 2019-03-01 DIAGNOSIS — R3914 Feeling of incomplete bladder emptying: Secondary | ICD-10-CM | POA: Diagnosis not present

## 2019-03-07 ENCOUNTER — Telehealth: Payer: Self-pay | Admitting: Cardiology

## 2019-03-07 NOTE — Telephone Encounter (Signed)
Husband BP recently has been higher than normal

## 2019-03-07 NOTE — Telephone Encounter (Signed)
Returned wife call. NA. Left msg to call back.

## 2019-03-08 ENCOUNTER — Emergency Department (HOSPITAL_COMMUNITY): Payer: PPO

## 2019-03-08 ENCOUNTER — Observation Stay (HOSPITAL_COMMUNITY)
Admission: EM | Admit: 2019-03-08 | Discharge: 2019-03-09 | Disposition: A | Discharge status: Home / Self Care | Payer: PPO | Attending: Internal Medicine | Admitting: Internal Medicine

## 2019-03-08 ENCOUNTER — Other Ambulatory Visit: Payer: Self-pay

## 2019-03-08 ENCOUNTER — Encounter (HOSPITAL_COMMUNITY): Payer: Self-pay | Admitting: Emergency Medicine

## 2019-03-08 DIAGNOSIS — Z1159 Encounter for screening for other viral diseases: Secondary | ICD-10-CM | POA: Insufficient documentation

## 2019-03-08 DIAGNOSIS — N183 Chronic kidney disease, stage 3 (moderate): Secondary | ICD-10-CM | POA: Insufficient documentation

## 2019-03-08 DIAGNOSIS — Z888 Allergy status to other drugs, medicaments and biological substances status: Secondary | ICD-10-CM | POA: Diagnosis not present

## 2019-03-08 DIAGNOSIS — I517 Cardiomegaly: Secondary | ICD-10-CM | POA: Diagnosis not present

## 2019-03-08 DIAGNOSIS — I10 Essential (primary) hypertension: Secondary | ICD-10-CM | POA: Diagnosis not present

## 2019-03-08 DIAGNOSIS — Z8249 Family history of ischemic heart disease and other diseases of the circulatory system: Secondary | ICD-10-CM | POA: Insufficient documentation

## 2019-03-08 DIAGNOSIS — G4733 Obstructive sleep apnea (adult) (pediatric): Secondary | ICD-10-CM | POA: Diagnosis not present

## 2019-03-08 DIAGNOSIS — I2510 Atherosclerotic heart disease of native coronary artery without angina pectoris: Secondary | ICD-10-CM | POA: Insufficient documentation

## 2019-03-08 DIAGNOSIS — E1151 Type 2 diabetes mellitus with diabetic peripheral angiopathy without gangrene: Secondary | ICD-10-CM | POA: Insufficient documentation

## 2019-03-08 DIAGNOSIS — E1122 Type 2 diabetes mellitus with diabetic chronic kidney disease: Secondary | ICD-10-CM | POA: Diagnosis not present

## 2019-03-08 DIAGNOSIS — R079 Chest pain, unspecified: Secondary | ICD-10-CM | POA: Diagnosis not present

## 2019-03-08 DIAGNOSIS — E785 Hyperlipidemia, unspecified: Secondary | ICD-10-CM | POA: Insufficient documentation

## 2019-03-08 DIAGNOSIS — E1136 Type 2 diabetes mellitus with diabetic cataract: Secondary | ICD-10-CM | POA: Diagnosis not present

## 2019-03-08 DIAGNOSIS — I1310 Hypertensive heart and chronic kidney disease without heart failure, with stage 1 through stage 4 chronic kidney disease, or unspecified chronic kidney disease: Secondary | ICD-10-CM | POA: Diagnosis not present

## 2019-03-08 DIAGNOSIS — Z7901 Long term (current) use of anticoagulants: Secondary | ICD-10-CM | POA: Diagnosis not present

## 2019-03-08 DIAGNOSIS — I160 Hypertensive urgency: Principal | ICD-10-CM | POA: Diagnosis present

## 2019-03-08 DIAGNOSIS — R0789 Other chest pain: Secondary | ICD-10-CM | POA: Diagnosis not present

## 2019-03-08 DIAGNOSIS — Z89512 Acquired absence of left leg below knee: Secondary | ICD-10-CM | POA: Insufficient documentation

## 2019-03-08 DIAGNOSIS — I4821 Permanent atrial fibrillation: Secondary | ICD-10-CM | POA: Insufficient documentation

## 2019-03-08 DIAGNOSIS — Z87891 Personal history of nicotine dependence: Secondary | ICD-10-CM | POA: Insufficient documentation

## 2019-03-08 DIAGNOSIS — R001 Bradycardia, unspecified: Secondary | ICD-10-CM

## 2019-03-08 DIAGNOSIS — Z20828 Contact with and (suspected) exposure to other viral communicable diseases: Secondary | ICD-10-CM | POA: Diagnosis not present

## 2019-03-08 DIAGNOSIS — Z951 Presence of aortocoronary bypass graft: Secondary | ICD-10-CM | POA: Insufficient documentation

## 2019-03-08 DIAGNOSIS — Z79899 Other long term (current) drug therapy: Secondary | ICD-10-CM | POA: Insufficient documentation

## 2019-03-08 LAB — BASIC METABOLIC PANEL
Anion gap: 6 (ref 5–15)
BUN: 16 mg/dL (ref 8–23)
CO2: 25 mmol/L (ref 22–32)
Calcium: 9.1 mg/dL (ref 8.9–10.3)
Chloride: 107 mmol/L (ref 98–111)
Creatinine, Ser: 0.96 mg/dL (ref 0.61–1.24)
GFR calc Af Amer: >60 mL/min (ref >60)
GFR calc non Af Amer: >60 mL/min (ref >60)
Glucose, Bld: 92 mg/dL (ref 70–99)
Potassium: 4.2 mmol/L (ref 3.5–5.1)
Sodium: 138 mmol/L (ref 135–145)

## 2019-03-08 LAB — TROPONIN I (HIGH SENSITIVITY)
Troponin I (High Sensitivity): 12 ng/L (ref <18)
Troponin I (High Sensitivity): 12 ng/L (ref <18)
Troponin I (High Sensitivity): 11 ng/L

## 2019-03-08 LAB — URINALYSIS, ROUTINE W REFLEX MICROSCOPIC
Bacteria, UA: NONE SEEN
Bilirubin Urine: NEGATIVE
Glucose, UA: NEGATIVE mg/dL
Ketones, ur: NEGATIVE mg/dL
Nitrite: NEGATIVE
Protein, ur: 100 mg/dL — AB
Specific Gravity, Urine: 1.009 (ref 1.005–1.030)
WBC, UA: >50 WBC/hpf — ABNORMAL HIGH (ref 0–5)
pH: 6 (ref 5.0–8.0)

## 2019-03-08 LAB — MRSA PCR SCREENING: MRSA by PCR: NEGATIVE

## 2019-03-08 LAB — CBC
HCT: 38.8 % — ABNORMAL LOW (ref 39.0–52.0)
Hemoglobin: 12.3 g/dL — ABNORMAL LOW (ref 13.0–17.0)
MCH: 30.7 pg (ref 26.0–34.0)
MCHC: 31.7 g/dL (ref 30.0–36.0)
MCV: 96.8 fL (ref 80.0–100.0)
Platelets: 133 10*3/uL — ABNORMAL LOW (ref 150–400)
RBC: 4.01 MIL/uL — ABNORMAL LOW (ref 4.22–5.81)
RDW: 14.5 % (ref 11.5–15.5)
WBC: 8.6 10*3/uL (ref 4.0–10.5)
nRBC: 0 % (ref 0.0–0.2)

## 2019-03-08 LAB — SARS CORONAVIRUS 2 BY RT PCR (HOSPITAL ORDER, PERFORMED IN ~~LOC~~ HOSPITAL LAB): SARS Coronavirus 2: NEGATIVE

## 2019-03-08 LAB — CBG MONITORING, ED: Glucose-Capillary: 85 mg/dL (ref 70–99)

## 2019-03-08 MED ORDER — CINNAMON 500 MG PO CAPS: 1000.0000 mg | ORAL_CAPSULE | Freq: Two times a day (BID) | ORAL | Status: DC

## 2019-03-08 MED ORDER — HYDRALAZINE HCL 25 MG PO TABS
50.0000 mg | ORAL_TABLET | Freq: Three times a day (TID) | ORAL | Status: DC
  Administered 2019-03-08 – 2019-03-09 (×2): 50 mg via ORAL
  Filled 2019-03-08 (×2): qty 2

## 2019-03-08 MED ORDER — HYDRALAZINE HCL 20 MG/ML IJ SOLN
10.0000 mg | Freq: Once | INTRAMUSCULAR | Status: AC
  Administered 2019-03-08: 10 mg via INTRAVENOUS
  Filled 2019-03-08: qty 1

## 2019-03-08 MED ORDER — OMEGA-3-ACID ETHYL ESTERS 1 G PO CAPS
1000.0000 mg | ORAL_CAPSULE | Freq: Every day | ORAL | Status: DC
  Administered 2019-03-09: 1000 mg via ORAL
  Filled 2019-03-08: qty 1

## 2019-03-08 MED ORDER — ONDANSETRON HCL 4 MG PO TABS: 4.0000 mg | ORAL_TABLET | Freq: Four times a day (QID) | ORAL | Status: DC | PRN

## 2019-03-08 MED ORDER — ACETAMINOPHEN 325 MG PO TABS: 650.0000 mg | ORAL_TABLET | Freq: Four times a day (QID) | ORAL | Status: DC | PRN

## 2019-03-08 MED ORDER — SODIUM CHLORIDE 0.9% FLUSH: 3.0000 mL | INTRAVENOUS | Status: DC | PRN

## 2019-03-08 MED ORDER — ONDANSETRON HCL 4 MG/2ML IJ SOLN: 4.0000 mg | Freq: Four times a day (QID) | INTRAMUSCULAR | Status: DC | PRN

## 2019-03-08 MED ORDER — FINASTERIDE 5 MG PO TABS
5.0000 mg | ORAL_TABLET | Freq: Every day | ORAL | Status: DC
  Administered 2019-03-09: 5 mg via ORAL
  Filled 2019-03-08: qty 1

## 2019-03-08 MED ORDER — APIXABAN 5 MG PO TABS
5.0000 mg | ORAL_TABLET | Freq: Two times a day (BID) | ORAL | Status: DC
  Administered 2019-03-08 – 2019-03-09 (×2): 5 mg via ORAL
  Filled 2019-03-08 (×2): qty 1

## 2019-03-08 MED ORDER — SACCHAROMYCES BOULARDII 250 MG PO CAPS
250.0000 mg | ORAL_CAPSULE | Freq: Every day | ORAL | Status: DC
  Administered 2019-03-09: 250 mg via ORAL
  Filled 2019-03-08: qty 1

## 2019-03-08 MED ORDER — ACETAMINOPHEN 650 MG RE SUPP: 650.0000 mg | Freq: Four times a day (QID) | RECTAL | Status: DC | PRN

## 2019-03-08 MED ORDER — LISINOPRIL 10 MG PO TABS
20.0000 mg | ORAL_TABLET | Freq: Every day | ORAL | Status: DC
  Administered 2019-03-09: 20 mg via ORAL
  Filled 2019-03-08: qty 2

## 2019-03-08 MED ORDER — SODIUM CHLORIDE 0.9 % IV SOLN: 250.0000 mL | INTRAVENOUS | Status: DC | PRN

## 2019-03-08 MED ORDER — FERROUS SULFATE 325 (65 FE) MG PO TABS
325.0000 mg | ORAL_TABLET | Freq: Three times a day (TID) | ORAL | Status: DC
  Administered 2019-03-08 – 2019-03-09 (×3): 325 mg via ORAL
  Filled 2019-03-08 (×3): qty 1

## 2019-03-08 MED ORDER — VITAMIN D 25 MCG (1000 UNIT) PO TABS
1000.0000 [IU] | ORAL_TABLET | ORAL | Status: DC
  Administered 2019-03-09: 1000 [IU] via ORAL
  Filled 2019-03-08: qty 1

## 2019-03-08 MED ORDER — HYDRALAZINE HCL 20 MG/ML IJ SOLN: 10.0000 mg | INTRAMUSCULAR | Status: DC | PRN

## 2019-03-08 MED ORDER — AMLODIPINE BESYLATE 5 MG PO TABS
10.0000 mg | ORAL_TABLET | Freq: Every day | ORAL | Status: DC
  Administered 2019-03-09: 10 mg via ORAL
  Filled 2019-03-08: qty 2

## 2019-03-08 MED ORDER — ROSUVASTATIN CALCIUM 20 MG PO TABS
40.0000 mg | ORAL_TABLET | Freq: Every day | ORAL | Status: DC
  Administered 2019-03-09: 40 mg via ORAL
  Filled 2019-03-08: qty 2

## 2019-03-08 MED ORDER — PENTOXIFYLLINE ER 400 MG PO TBCR
400.0000 mg | EXTENDED_RELEASE_TABLET | Freq: Three times a day (TID) | ORAL | Status: DC
  Administered 2019-03-08 – 2019-03-09 (×3): 400 mg via ORAL
  Filled 2019-03-08 (×3): qty 1

## 2019-03-08 MED ORDER — SODIUM CHLORIDE 0.9% FLUSH
3.0000 mL | Freq: Two times a day (BID) | INTRAVENOUS | Status: DC
  Administered 2019-03-08 – 2019-03-09 (×2): 3 mL via INTRAVENOUS

## 2019-03-08 MED ORDER — VITAMIN B-12 1000 MCG PO TABS
500.0000 ug | ORAL_TABLET | Freq: Every day | ORAL | Status: DC
  Administered 2019-03-09: 500 ug via ORAL
  Filled 2019-03-08: qty 1

## 2019-03-08 NOTE — ED Triage Notes (Signed)
Patient complains of left sided chest pain and hypertension. Home reading of 213/111 this morning. States chest pain x 3 weeks intermittently. Hypertension started 1 week ago. Denies shortness of breath.

## 2019-03-08 NOTE — ED Provider Notes (Signed)
[facility] EMERGENCY DEPARTMENT Provider Note   CSN: [id number] Arrival date & time: 03/08/19  0909    History   Chief Complaint Chief Complaint  Patient presents with  . Chest Pain    HPI Eric [name]. is a 85 y.o. male with a history of HTN, hypercholesterolemia, chronic afib on xaralto, , CAD with CABG in 2000, peripheral vascular disease who is s/p left bka in 2019, CKD stage 3, DM which is diet controlled presenting with elevated blood pressures despite being compliant with his medications (states he missed a dose 4 mornings ago only). He has been checking his bp's about 4 times daily and has his machine here, most recent bp this am was 233/121.   He also reports intermittent left sided chest pain, described as sharp and non radiating associated with mild nausea which has been more frequent recently but has been present for months.  His pain is not exertional, last episode occurring ytd am, lasted about an hour.  He denies palpitations, diaphoresis, sob.  He also denies peripheral edema, vision changes. He does endorse mild intermittent headache, not necessarily at times when his bp is elevated.  He has found no alleviators.     The history is provided by the patient, a relative and medical records (son at bedside).    Past Medical History:  Diagnosis Date  . Atrial flutter ([place])    s/p RFCA  . CAD (coronary artery disease)    cath 2003, occluded S-RCA, occluded S-Dx, L-LAD ok, s/p PTCA to LAD  . Cataract   . CKD (chronic kidney disease) stage 3, GFR 30-59 ml/min (HCC)   . Diabetes mellitus    diet controlled  . Hearing aid worn    bilateral  . History of kidney stones   . Long term (current) use of anticoagulants   . Neuromuscular disorder ([place])   . OSA (obstructive sleep apnea) 12/11   very mild, AHI 7/hr  . Persistent atrial fibrillation 09/26/2015  . Pure hyperglyceridemia   . PVD (peripheral vascular disease) ([place])    angioplasty of his right lower extremity  in [place] by Dr.[name] 2013  . Unspecified essential hypertension   . Wears dentures    full upper and lower    Patient Active Problem List   Diagnosis Date Noted  . Hx of BKA, left ([place]) 01/13/2018  . Nausea and vomiting in adult 12/26/2017  . Pressure injury of skin 12/23/2017  . C. difficile colitis 12/22/2017  . UTI (urinary tract infection) 12/22/2017  . Hyponatremia 12/22/2017  . Normocytic anemia 12/22/2017  . Atherosclerosis of native arteries of the extremities with ulceration ([place]) 11/23/2017  . Chronic anticoagulation 09/26/2015  . Persistent atrial fibrillation 09/26/2015  . PVD (peripheral vascular disease) ([place]) 06/02/2012  . BPH (benign prostatic hyperplasia) 11/16/2011  . AKI (acute kidney injury) ([place]) 08/26/2011  . Coronary artery disease 03/25/2011  . OBSTRUCTIVE SLEEP APNEA 09/15/2010  . PERIODIC LIMB MOVEMENT DISORDER 09/15/2010  . PERSONAL HISTORY OF COLONIC POLYPS 10/25/2009  . VITAMIN B12 DEFICIENCY 03/04/2009  . HYPERTRIGLYCERIDEMIA 11/24/2006  . Essential hypertension 11/24/2006    Past Surgical History:  Procedure Laterality Date  . ABDOMINAL AORTOGRAM W/LOWER EXTREMITY N/A 11/15/2017   Procedure: ABDOMINAL AORTOGRAM W/LOWER EXTREMITY;  Surgeon: [name], MD;  Location: [place] [facility];  Service: Cardiovascular;  Laterality: N/A;  . Adenosine Myoview  3/06   EF 56%, neg. Ischemia  . Adenosine Myoview  02/18/07   nml  . AMPUTATION Left 12/15/2017  Procedure: AMPUTATION BELOW KNEE;  Surgeon: Algernon [name], MD;  Location: [facility];  Service: Vascular;  Laterality: Left;  . ANGIOPLASTY  1/99   CAD- diogonal with rotational artherectomy  . Arthrectomy     of LAD & PTCA  . BLEPHAROPLASTY Bilateral   . CARDIAC CATHETERIZATION  1/00  . CARDIOVERSION  1/04  . CARDIOVERSION  5/07   hospital- a flutter  . CARPAL TUNNEL RELEASE     ? bilateral  . CATARACT EXTRACTION W/PHACO Right 07/28/2017   Procedure: CATARACT EXTRACTION PHACO AND  INTRAOCULAR LENS PLACEMENT ([place]) RIGHT DIABETIC;  Surgeon: [name], MD;  Location: [place];  Service: Ophthalmology;  Laterality: Right;  Diabetic - diet controlled  . CATARACT EXTRACTION W/PHACO Left 08/18/2017   Procedure: CATARACT EXTRACTION PHACO AND INTRAOCULAR LENS PLACEMENT ([facility]);  Surgeon: [name], MD;  Location: [place];  Service: Ophthalmology;  Laterality: Left;  DIABETES - oral meds  . COLONOSCOPY W/ BIOPSIES  10/01/06   sigmoid polyp bx neg, 3 years  . CORONARY ANGIOPLASTY  4/03   cutting balloon PTCA pLAD into Diag  . CORONARY ARTERY BYPASS GRAFT  2000   LIMA-LAD, SVG-RCA, SVG-Diag; SVG-Diag & SVG-RCA occluded 2003  . FRACTURE SURGERY    . HAND SURGERY  08/27/09   R thumb procedure wit Scaphoid Gragt and screws, Dr [name]  . LOWER EXTREMITY ANGIOGRAPHY Right 01/25/2019   Procedure: LOWER EXTREMITY ANGIOGRAPHY;  Surgeon: Katha Cabal, MD;  Location: [place] [facility];  Service: Cardiovascular;  Laterality: Right;  . NM MYOVIEW LTD  4/11   normal  . PERIPHERAL VASCULAR CATHETERIZATION Right 06/27/2015   Procedure: Lower Extremity Angiography;  Surgeon: Algernon [name], MD;  Location: [place] [facility];  Service: Cardiovascular;  Laterality: Right;  . PERIPHERAL VASCULAR CATHETERIZATION  06/27/2015   Procedure: Lower Extremity Intervention;  Surgeon: Algernon [name], MD;  Location: [place] [facility];  Service: Cardiovascular;;        Home Medications    Prior to Admission medications   Medication Sig Start Date End Date Taking? Authorizing Provider  acetaminophen (TYLENOL) 325 MG tablet Take 2 tablets (650 mg total) by mouth every 6 (six) hours as needed for mild pain (or temp >/= 101 F). 12/21/17  Yes Loletha Grayer, MD  amLODipine (NORVASC) 10 MG tablet Take 1 tablet by mouth once daily 11/21/18  Yes Pickard, Cammie Mcgee, MD  apixaban (ELIQUIS) 2.5 MG TABS tablet Take 1 tablet (2.5 mg total) by mouth 2 (two) times daily.  08/22/18  Yes [name], MD  B Complex Vitamins (B COMPLEX-B12) TABS Take 1 tablet by mouth daily.    Yes [provider]  cholecalciferol (VITAMIN D) 1000 UNITS tablet Take 1,000 Units by mouth 2 (two) times a week. Takes on Mon and Fri   Yes [provider]  Cinnamon 500 MG capsule Take 1,000 mg by mouth 2 (two) times daily.    Yes [provider]  ferrous sulfate 325 (65 FE) MG EC tablet Take 325 mg by mouth 3 (three) times daily with meals.   Yes [provider]  finasteride (PROSCAR) 5 MG tablet TAKE 1 TABLET BY MOUTH ONCE DAILY 03/28/18  Yes [name], MD  fish oil-omega-3 fatty acids 1000 MG capsule Take 1,000 mg by mouth daily.    Yes [provider]  lisinopril (PRINIVIL,ZESTRIL) 20 MG tablet Take 1 tablet (20 mg total) by mouth daily. 12/21/17  Yes Loletha Grayer, MD  metoprolol tartrate (  LOPRESSOR) 25 MG tablet Take 0.5 tablets (12.5 mg total) by mouth 2 (two) times daily. 03/11/18  Yes [name], MD  Multiple Vitamin (DAILY MULTIVITAMIN PO) Take 1 tablet by mouth daily.     Yes [provider]  ondansetron (ZOFRAN) 4 MG tablet Take 4 mg by mouth every 6 (six) hours as needed for nausea or vomiting.   Yes [provider]  pentoxifylline (TRENTAL) 400 MG CR tablet TAKE 1 TABLET BY MOUTH THREE TIMES DAILY WITH MEALS 01/27/19  Yes [name], MD  rosuvastatin (CRESTOR) 40 MG tablet Take 1 tablet (40 mg total) by mouth daily. 07/11/18  Yes [name], MD  saccharomyces boulardii (FLORASTOR) 250 MG capsule Take 1 capsule (250 mg total) by mouth daily. 06/10/18  Yes [name], MD    Family History Family History  Problem Relation Age of Onset  . Stroke Father   . Aneurysm Father   . Hypertension Father   . Prostate cancer Brother   . Hypertension Mother   . Lung cancer Brother        smoker  . Thrombosis Brother   . Other Brother        RF valve disorder (smoker)  . Lung cancer  Brother        smoker  . Breast cancer Sister   . Liver cancer Brother   . Other Sister        cerebral hemorrhage    Social History Social History   Tobacco Use  . Smoking status: Never Smoker  . Smokeless tobacco: Former Network engineer Use Topics  . Alcohol use: No  . Drug use: No     Allergies   Isosorbide mononitrate, Ramipril, and Quinidine gluconate   Review of Systems Review of Systems   Physical Exam Updated Vital Signs BP (!) 177/74   Pulse (!) 49   Temp 97.8 F (36.6 C) (Oral)   Resp 15   Ht 5\' 5"  (1.651 m)   Wt 72.6 kg   SpO2 100%   BMI 26.63 kg/m   Physical Exam Vitals signs and nursing note reviewed.  Constitutional:      Appearance: He is well-developed.  HENT:     Head: Normocephalic and atraumatic.  Eyes:     Conjunctiva/sclera: Conjunctivae normal.  Neck:     Musculoskeletal: Normal range of motion.  Cardiovascular:     Rate and Rhythm: Normal rate. Rhythm irregular.     Heart sounds: Normal heart sounds.  Pulmonary:     Effort: Pulmonary effort is normal.     Breath sounds: Normal breath sounds. No wheezing.  Abdominal:     General: Bowel sounds are normal.     Palpations: Abdomen is soft.     Tenderness: There is no abdominal tenderness.  Musculoskeletal: Normal range of motion.     Right lower leg: He exhibits no tenderness. No edema.     Comments: Left bka.  Skin:    General: Skin is warm and dry.  Neurological:     General: No focal deficit present.     Mental Status: He is alert.      ED Treatments / Results  Labs (all labs ordered are listed, but only abnormal results are displayed) Labs Reviewed  CBC - Abnormal; Notable for the following components:      Result Value   RBC 4.01 (*)    Hemoglobin 12.3 (*)    HCT 38.8 (*)    Platelets 133 (*)  All other components within normal limits  URINALYSIS, ROUTINE W REFLEX MICROSCOPIC - Abnormal; Notable for the following components:   APPearance CLOUDY (*)    Hgb  urine dipstick MODERATE (*)    Protein, ur 100 (*)    Leukocytes,Ua LARGE (*)    WBC, UA >50 (*)    All other components within normal limits  URINE CULTURE  BASIC METABOLIC PANEL  CBG MONITORING, ED  TROPONIN I (HIGH SENSITIVITY)  TROPONIN I (HIGH SENSITIVITY)    EKG EKG Interpretation  Date/Time:  Wednesday March 08 2019 09:39:50 EDT Ventricular Rate:  48 PR Interval:    QRS Duration: 97 QT Interval:  435 QTC Calculation: 389 R Axis:   47 Text Interpretation:  Atrial fibrillation Confirmed by [place] Ferguson [phone]) on 03/08/2019 1:09:52 PM   Radiology Dg Chest Portable 1 View  Result Date: 03/08/2019 CLINICAL DATA:  Left-sided chest pain. Symptoms intermittently for 3 weeks. EXAM: PORTABLE CHEST 1 VIEW COMPARISON:  Chest x-ray 09/14/2018 FINDINGS: The heart is enlarged. Mild tortuosity of the thoracic aorta. The pulmonary hila appear normal. Mild chronic bronchitic type changes but no infiltrates, edema or effusions. The bony thorax is intact. IMPRESSION: Cardiac enlargement and chronic pulmonary changes but no acute pulmonary findings. Electronically Signed   By: Marijo Sanes M.D.   On: 03/08/2019 10:38    Procedures Procedures (including critical care time)  Medications Ordered in ED Medications - No data to display   Initial Impression / Assessment and Plan / ED Course  I have reviewed the triage vital signs and the nursing notes.  Pertinent labs & imaging results that were available during my care of the patient were reviewed by me and considered in my medical decision making (see chart for details).       Pt with chest pain and increasing bp's despite compliance with medications.  Discussed with Dr. Harl Bowie who will consult pt, advises admission to medicine service.  Suspect chest pain demand from bp elevation.  Adding IV hydralazine for bp control.  Call placed to hospitalist for admission.  Final Clinical Impressions(s) / ED Diagnoses   Final diagnoses:   Essential hypertension  Chest pain, unspecified type    ED Discharge Orders    None       Landis Martins 03/08/19 1357    [place] Ferguson, MD 03/08/19 Lurena Nida

## 2019-03-08 NOTE — Telephone Encounter (Signed)
Spoke with Pt and wife who state that pt has had elevated BP for several days. BP today is 213/111 HR before taking morning meds.  And 207/95 HR 50 on yesterday. Pt does report having headache, intermediate CP on, some nausea, and left arm pain over the last two days. Pt denies being dizzy and increased SOB. Pt encouraged to be seen in ER. Please advised.

## 2019-03-08 NOTE — Consult Note (Signed)
Cardiology Consultation:   Patient ID: Eric [name]. MRN: [id number]; DOB: 1927-12-23  Admit date: 03/08/2019 Date of Consult: 03/08/2019  Primary Care Provider: [name], MD Primary Cardiologist: Dr Carlyle Dolly MD Primary Electrophysiologist:  None    Patient Profile:   Eric [name]. is a 85 y.o. male with a hx of CAD, HTN, afib who is being seen today for the evaluation of severe HTN and ches tpain at the request of Dr Roderic Palau.  History of Present Illness:   Mr. [name] 85 yo male history of CABG in 2000w/ LIMA-LAD, SVG-Diag, SVG-PDA. Subsequent PTCA of LAD in 2003, permanent afib, CKD III, DM2, PAD with prior interventinos and BKA followed by vascular, HTN, hyperlipidemia presents with severe HTN and chest pain  Reports SBPs over 200s at times at home. Since having high bp's has had intermittent chest pain. Left sided sharp pain 2-3/10 no other associated symptoms, lasting 20-30 min at a time. Not positional. He reports compliance with all his bp meds  K 4.2 Cr 0.96 BUN 16 WBC 8.6 Hgb 12.3 Plt 133  hstrop 12-->12 CXR: no acute process EKG afib slow rate 48    Heart Pathway Score:     Past Medical History:  Diagnosis Date  . Atrial flutter ([place])    s/p RFCA  . CAD (coronary artery disease)    cath 2003, occluded S-RCA, occluded S-Dx, L-LAD ok, s/p PTCA to LAD  . Cataract   . CKD (chronic kidney disease) stage 3, GFR 30-59 ml/min (HCC)   . Diabetes mellitus    diet controlled  . Hearing aid worn    bilateral  . History of kidney stones   . Long term (current) use of anticoagulants   . Neuromuscular disorder ([place])   . OSA (obstructive sleep apnea) 12/11   very mild, AHI 7/hr  . Persistent atrial fibrillation 09/26/2015  . Pure hyperglyceridemia   . PVD (peripheral vascular disease) ([place])    angioplasty of his right lower extremity in [place] by Dr.[name] 2013  . Unspecified essential hypertension   . Wears dentures    full upper and lower    Past Surgical History:  Procedure Laterality Date  . ABDOMINAL AORTOGRAM W/LOWER EXTREMITY N/A 11/15/2017   Procedure: ABDOMINAL AORTOGRAM W/LOWER EXTREMITY;  Surgeon: [name], MD;  Location: [place] [facility];  Service: Cardiovascular;  Laterality: N/A;  . Adenosine Myoview  3/06   EF 56%, neg. Ischemia  . Adenosine Myoview  02/18/07   nml  . AMPUTATION Left 12/15/2017   Procedure: AMPUTATION BELOW KNEE;  Surgeon: Algernon [name], MD;  Location: [facility];  Service: Vascular;  Laterality: Left;  . ANGIOPLASTY  1/99   CAD- diogonal with rotational artherectomy  . Arthrectomy     of LAD & PTCA  . BLEPHAROPLASTY Bilateral   . CARDIAC CATHETERIZATION  1/00  . CARDIOVERSION  1/04  . CARDIOVERSION  5/07   hospital- a flutter  . CARPAL TUNNEL RELEASE     ? bilateral  . CATARACT EXTRACTION W/PHACO Right 07/28/2017   Procedure: CATARACT EXTRACTION PHACO AND INTRAOCULAR LENS PLACEMENT ([place]) RIGHT DIABETIC;  Surgeon: [name], MD;  Location: [facility];  Service: Ophthalmology;  Laterality: Right;  Diabetic - diet controlled  . CATARACT EXTRACTION W/PHACO Left 08/18/2017   Procedure: CATARACT EXTRACTION PHACO AND INTRAOCULAR LENS PLACEMENT ([facility]);  Surgeon: [name], MD;  Location: [place];  Service: Ophthalmology;  Laterality: Left;  DIABETES - oral meds  . COLONOSCOPY  W/ BIOPSIES  10/01/06   sigmoid polyp bx neg, 3 years  . CORONARY ANGIOPLASTY  4/03   cutting balloon PTCA pLAD into Diag  . CORONARY ARTERY BYPASS GRAFT  2000   LIMA-LAD, SVG-RCA, SVG-Diag; SVG-Diag & SVG-RCA occluded 2003  . FRACTURE SURGERY    . HAND SURGERY  08/27/09   R thumb procedure wit Scaphoid Gragt and screws, Dr [name]  . LOWER EXTREMITY ANGIOGRAPHY Right 01/25/2019   Procedure: LOWER EXTREMITY ANGIOGRAPHY;  Surgeon: Katha Cabal, MD;  Location: [place] [facility];  Service: Cardiovascular;  Laterality: Right;  . NM MYOVIEW LTD  4/11   normal  . PERIPHERAL  VASCULAR CATHETERIZATION Right 06/27/2015   Procedure: Lower Extremity Angiography;  Surgeon: Algernon [name], MD;  Location: [facility];  Service: Cardiovascular;  Laterality: Right;  . PERIPHERAL VASCULAR CATHETERIZATION  06/27/2015   Procedure: Lower Extremity Intervention;  Surgeon: Algernon [name], MD;  Location: [facility];  Service: Cardiovascular;;     Inpatient Medications: Scheduled Meds: . hydrALAZINE  10 mg Intravenous Once   Continuous Infusions:  PRN Meds:   Allergies:    Allergies  Allergen Reactions  . Isosorbide Mononitrate Other (See Comments)    Unknown, doesn't remember   . Ramipril Cough  . Quinidine Gluconate Rash    Social History:   Social History   Socioeconomic History  . Marital status: Married    Spouse name: Not on file  . Number of children: 5  . Years of education: Not on file  . Highest education level: Not on file  Occupational History  . Occupation: [employer]    Employer: RETIRED  Social Needs  . Financial resource strain: Not on file  . Food insecurity    Worry: Not on file    Inability: Not on file  . Transportation needs    Medical: Not on file    Non-medical: Not on file  Tobacco Use  . Smoking status: Never Smoker  . Smokeless tobacco: Former Network engineer and Sexual Activity  . Alcohol use: No  . Drug use: No  . Sexual activity: Never  Lifestyle  . Physical activity    Days per week: Not on file    Minutes per session: Not on file  . Stress: Not on file  Relationships  . Social Herbalist on phone: Not on file    Gets together: Not on file    Attends religious service: Not on file    Active member of club or organization: Not on file    Attends meetings of clubs or organizations: Not on file    Relationship status: Not on file  . Intimate partner violence    Fear of current or ex partner: Not on file    Emotionally abused: Not on file    Physically abused: Not on file    Forced  sexual activity: Not on file  Other Topics Concern  . Not on file  Social History Narrative   Retired now does some antiques   Retired from Theatre manager and dye work   Married 1951   5 kids    Family History:    Family History  Problem Relation Age of Onset  . Stroke Father   . Aneurysm Father   . Hypertension Father   . Prostate cancer Brother   . Hypertension Mother   . Lung cancer Brother        smoker  . Thrombosis  Brother   . Other Brother        RF valve disorder (smoker)  . Lung cancer Brother        smoker  . Breast cancer Sister   . Liver cancer Brother   . Other Sister        cerebral hemorrhage     ROS:  Please see the history of present illness.  All other ROS reviewed and negative.     Physical Exam/Data:   Vitals:   03/08/19 1000 03/08/19 1015 03/08/19 1030 03/08/19 1100  BP: (!) 193/85  (!) 181/58 (!) 177/74  Pulse:  (!) 46  (!) 49  Resp: 15 14 14 15   Temp:      TempSrc:      SpO2:  99%  100%  Weight:      Height:       No intake or output data in the 24 hours ending 03/08/19 1346 Last 3 Weights 03/08/2019 02/09/2019 01/25/2019  Weight (lbs) 160 lb 168 lb 12.8 oz 165 lb  Weight (kg) 72.576 kg 76.567 kg 74.844 kg     Body mass index is 26.63 kg/m.  General:  Well nourished, well developed, in no acute distress HEENT: normal Lymph: no adenopathy Neck: no JVD Endocrine:  No thryomegaly Vascular: No carotid bruits; FA pulses 2+ bilaterally without bruits  Cardiac:  Irreg, no m/r/g Lungs:  clear to auscultation bilaterally, no wheezing, rhonchi or rales  Abd: soft, nontender, no hepatomegaly  Ext: left prosthetic, right trace edema Musculoskeletal:  No deformities, BUE and BLE strength normal and equal Skin: warm and dry  Neuro:  CNs 2-12 intact, no focal abnormalities noted Psych:  Normal affect    Laboratory Data:  High Sensitivity Troponin:   Recent Labs  Lab 03/08/19 1017 03/08/19 1232  TROPONINIHS 12 12     Cardiac EnzymesNo results  for input(s): TROPONINI in the last 168 hours. No results for input(s): TROPIPOC in the last 168 hours.  Chemistry Recent Labs  Lab 03/08/19 1017  NA 138  K 4.2  CL 107  CO2 25  GLUCOSE 92  BUN 16  CREATININE 0.96  CALCIUM 9.1  GFRNONAA >60  GFRAA >60  ANIONGAP 6    No results for input(s): PROT, ALBUMIN, AST, ALT, ALKPHOS, BILITOT in the last 168 hours. Hematology Recent Labs  Lab 03/08/19 1017  WBC 8.6  RBC 4.01*  HGB 12.3*  HCT 38.8*  MCV 96.8  MCH 30.7  MCHC 31.7  RDW 14.5  PLT 133*   BNPNo results for input(s): BNP, PROBNP in the last 168 hours.  DDimer No results for input(s): DDIMER in the last 168 hours.   Radiology/Studies:  Dg Chest Portable 1 View  Result Date: 03/08/2019 CLINICAL DATA:  Left-sided chest pain. Symptoms intermittently for 3 weeks. EXAM: PORTABLE CHEST 1 VIEW COMPARISON:  Chest x-ray 09/14/2018 FINDINGS: The heart is enlarged. Mild tortuosity of the thoracic aorta. The pulmonary hila appear normal. Mild chronic bronchitic type changes but no infiltrates, edema or effusions. The bony thorax is intact. IMPRESSION: Cardiac enlargement and chronic pulmonary changes but no acute pulmonary findings. Electronically Signed   By: Marijo Sanes M.D.   On: 03/08/2019 10:38    Assessment and Plan:   1. Chest pain/CAD - hstrops neg x 2, EKG without acute ischemic changes - symptom themself not overally consistent with ischemia - I supsect related to his severe HTN  - Follow symptoms with bp control    2. HTN - severe HTN  with SBPs >200s at home, here have been in the 190s -historically renal function has been labile, I would avoid aggressive titration of bp agents that may affect his renal function, Cr as high as 1.6 a year ago - he is on norvasc 10, lisinopril 20, metoprolol 12.5mg  bid  - would stop lopressor due to bradycardia. Start hydralazine 50mg  tid.     3. Afib/bradycardia - some rates high 30s in ER. He denies any significant  symptoms. On lopressor 12.5mg  bid at home, would d/c and monitor rates. Last dose of lopressor this morning at 8AM - continue eliquis, he has been on renally dosed eliquis based on prior renal function. Based on current function should increase to 5mg  bid.     Observation overnight for bp control, monitoring of chest pain, and monitoring of bradycardia  For questions or updates, please contact [facility] Please consult [email or web address] for contact info under     Signed, Carlyle Dolly, MD  03/08/2019 1:46 PM

## 2019-03-08 NOTE — Telephone Encounter (Signed)
I agree with your assessment, looks like he is being seen in the ER   Zandra Abts MD

## 2019-03-08 NOTE — H&P (Signed)
History and Physical    Eric [name]. DPO:[id number] DOB: [birth year] DOA: 03/08/2019  PCP: [name], MD   Patient coming from: Home  Chief Complaint: Severe BP and chest pain-intermittent  HPI: Eric [name]. is a 85 y.o. male with medical history significant for CAD with prior CABG in 2000, permanent atrial fibrillation on Eliquis, CKD stage III, type 2 diabetes, PAD with left BKA, dyslipidemia, and hypertension who presented to the emergency department with significantly elevated blood pressure readings of systolics over 431 at home.  He has noted intermittent episodes of left substernal chest pain without any radiation.  He denies any associated dyspnea, diaphoresis, nausea, vomiting and states that the pain does not appear to be exertional.  He seems to have episodes that last 20 to 30 minutes at a time and has been taking all of his blood pressure medications as otherwise prescribed.  He denies any lightheadedness or dizziness.   ED Course: Vital signs demonstrating elevated blood pressure readings with systolics in the 540G.  He was given 1 dose of IV hydralazine in the ED with some improvement noted.  Bradycardia still noted with heart rate in the 30 to 40 bpm range, the patient is essentially asymptomatic.  Laboratory data reviewed with no acute abnormalities identified.  Hemoglobin 12.3 and stable.  EKG with no other acute findings aside from bradycardia.  High-sensitivity troponin noted to be 12 on 2 sets.  1 view chest x-ray with cardiac enlargement, but no other acute findings.  He has been seen by cardiology with recommendations to stop beta-blocker on account of bradycardia and to remain on hydralazine for blood pressure control.  Review of Systems: All others reviewed and otherwise negative.  Past Medical History:  Diagnosis Date  . Atrial flutter ([place])    s/p RFCA  . CAD (coronary artery disease)    cath 2003, occluded S-RCA, occluded S-Dx, L-LAD ok, s/p PTCA  to LAD  . Cataract   . CKD (chronic kidney disease) stage 3, GFR 30-59 ml/min (HCC)   . Diabetes mellitus    diet controlled  . Hearing aid worn    bilateral  . History of kidney stones   . Long term (current) use of anticoagulants   . Neuromuscular disorder ([place])   . OSA (obstructive sleep apnea) 12/11   very mild, AHI 7/hr  . Persistent atrial fibrillation 09/26/2015  . Pure hyperglyceridemia   . PVD (peripheral vascular disease) ([place])    angioplasty of his right lower extremity in [place] by Dr.[name] 2013  . Unspecified essential hypertension   . Wears dentures    full upper and lower    Past Surgical History:  Procedure Laterality Date  . ABDOMINAL AORTOGRAM W/LOWER EXTREMITY N/A 11/15/2017   Procedure: ABDOMINAL AORTOGRAM W/LOWER EXTREMITY;  Surgeon: [name], MD;  Location: [facility];  Service: Cardiovascular;  Laterality: N/A;  . Adenosine Myoview  3/06   EF 56%, neg. Ischemia  . Adenosine Myoview  02/18/07   nml  . AMPUTATION Left 12/15/2017   Procedure: AMPUTATION BELOW KNEE;  Surgeon: Algernon [name], MD;  Location: [facility];  Service: Vascular;  Laterality: Left;  . ANGIOPLASTY  1/99   CAD- diogonal with rotational artherectomy  . Arthrectomy     of LAD & PTCA  . BLEPHAROPLASTY Bilateral   . CARDIAC CATHETERIZATION  1/00  . CARDIOVERSION  1/04  . CARDIOVERSION  5/07   hospital- a flutter  . CARPAL TUNNEL RELEASE     ?  bilateral  . CATARACT EXTRACTION W/PHACO Right 07/28/2017   Procedure: CATARACT EXTRACTION PHACO AND INTRAOCULAR LENS PLACEMENT ([place]) RIGHT DIABETIC;  Surgeon: [name], MD;  Location: [facility];  Service: Ophthalmology;  Laterality: Right;  Diabetic - diet controlled  . CATARACT EXTRACTION W/PHACO Left 08/18/2017   Procedure: CATARACT EXTRACTION PHACO AND INTRAOCULAR LENS PLACEMENT ([facility]);  Surgeon: [name], MD;  Location: [facility];  Service: Ophthalmology;  Laterality: Left;  DIABETES - oral  meds  . COLONOSCOPY W/ BIOPSIES  10/01/06   sigmoid polyp bx neg, 3 years  . CORONARY ANGIOPLASTY  4/03   cutting balloon PTCA pLAD into Diag  . CORONARY ARTERY BYPASS GRAFT  2000   LIMA-LAD, SVG-RCA, SVG-Diag; SVG-Diag & SVG-RCA occluded 2003  . FRACTURE SURGERY    . HAND SURGERY  08/27/09   R thumb procedure wit Scaphoid Gragt and screws, Dr [name]  . LOWER EXTREMITY ANGIOGRAPHY Right 01/25/2019   Procedure: LOWER EXTREMITY ANGIOGRAPHY;  Surgeon: Katha Cabal, MD;  Location: [facility];  Service: Cardiovascular;  Laterality: Right;  . NM MYOVIEW LTD  4/11   normal  . PERIPHERAL VASCULAR CATHETERIZATION Right 06/27/2015   Procedure: Lower Extremity Angiography;  Surgeon: Algernon [name], MD;  Location: [facility];  Service: Cardiovascular;  Laterality: Right;  . PERIPHERAL VASCULAR CATHETERIZATION  06/27/2015   Procedure: Lower Extremity Intervention;  Surgeon: Algernon [name], MD;  Location: [facility];  Service: Cardiovascular;;     reports that he has never smoked. He quit smokeless tobacco use about 26 years ago. He reports that he does not drink alcohol or use drugs.  Allergies  Allergen Reactions  . Isosorbide Mononitrate Other (See Comments)    Unknown, doesn't remember   . Ramipril Cough  . Quinidine Gluconate Rash    Family History  Problem Relation Age of Onset  . Stroke Father   . Aneurysm Father   . Hypertension Father   . Prostate cancer Brother   . Hypertension Mother   . Lung cancer Brother        smoker  . Thrombosis Brother   . Other Brother        RF valve disorder (smoker)  . Lung cancer Brother        smoker  . Breast cancer Sister   . Liver cancer Brother   . Other Sister        cerebral hemorrhage    Prior to Admission medications   Medication Sig Start Date End Date Taking? Authorizing Provider  acetaminophen (TYLENOL) 325 MG tablet Take 2 tablets (650 mg total) by mouth every 6 (six) hours as needed for mild pain (or  temp >/= 101 F). 12/21/17  Yes Loletha Grayer, MD  amLODipine (NORVASC) 10 MG tablet Take 1 tablet by mouth once daily 11/21/18  Yes Pickard, Cammie Mcgee, MD  apixaban (ELIQUIS) 2.5 MG TABS tablet Take 1 tablet (2.5 mg total) by mouth 2 (two) times daily. 08/22/18  Yes [name], MD  B Complex Vitamins (B COMPLEX-B12) TABS Take 1 tablet by mouth daily.    Yes [provider]  cholecalciferol (VITAMIN D) 1000 UNITS tablet Take 1,000 Units by mouth 2 (two) times a week. Takes on Mon and Fri   Yes [provider]  Cinnamon 500 MG capsule Take 1,000 mg by mouth 2 (two) times daily.    Yes [provider]  ferrous sulfate 325 (65 FE) MG EC tablet Take 325 mg by  mouth 3 (three) times daily with meals.   Yes [provider]  finasteride (PROSCAR) 5 MG tablet TAKE 1 TABLET BY MOUTH ONCE DAILY 03/28/18  Yes [name], MD  fish oil-omega-3 fatty acids 1000 MG capsule Take 1,000 mg by mouth daily.    Yes [provider]  lisinopril (PRINIVIL,ZESTRIL) 20 MG tablet Take 1 tablet (20 mg total) by mouth daily. 12/21/17  Yes Wieting, Richard, MD  metoprolol tartrate (LOPRESSOR) 25 MG tablet Take 0.5 tablets (12.5 mg total) by mouth 2 (two) times daily. 03/11/18  Yes [name], MD  Multiple Vitamin (DAILY MULTIVITAMIN PO) Take 1 tablet by mouth daily.     Yes [provider]  ondansetron (ZOFRAN) 4 MG tablet Take 4 mg by mouth every 6 (six) hours as needed for nausea or vomiting.   Yes [provider]  pentoxifylline (TRENTAL) 400 MG CR tablet TAKE 1 TABLET BY MOUTH THREE TIMES DAILY WITH MEALS 01/27/19  Yes [name], MD  rosuvastatin (CRESTOR) 40 MG tablet Take 1 tablet (40 mg total) by mouth daily. 07/11/18  Yes [name], MD  saccharomyces boulardii (FLORASTOR) 250 MG capsule Take 1 capsule (250 mg total) by mouth daily. 06/10/18  Yes [name], MD    Physical Exam: Vitals:   03/08/19 1015 03/08/19 1030  03/08/19 1100 03/08/19 1400  BP:  (!) 181/58 (!) 177/74 (!) 212/75  Pulse: (!) 46  (!) 49 (!) 50  Resp: 14 14 15 12   Temp:      TempSrc:      SpO2: 99%  100% 100%  Weight:      Height:        Constitutional: NAD, calm, comfortable Vitals:   03/08/19 1015 03/08/19 1030 03/08/19 1100 03/08/19 1400  BP:  (!) 181/58 (!) 177/74 (!) 212/75  Pulse: (!) 46  (!) 49 (!) 50  Resp: 14 14 15 12   Temp:      TempSrc:      SpO2: 99%  100% 100%  Weight:      Height:       Eyes: lids and conjunctivae normal ENMT: Mucous membranes are moist.  Neck: normal, supple Respiratory: clear to auscultation bilaterally. Normal respiratory effort. No accessory muscle use.  Cardiovascular: Irregular rhythm with bradycardia 30-40 bpm, no murmurs. No extremity edema. Abdomen: no tenderness, no distention. Bowel sounds positive.  Musculoskeletal:  No joint deformity upper and lower extremities.   Skin: no rashes, lesions, ulcers.  Psychiatric: Normal judgment and insight. Alert and oriented x 3. Normal mood.   Labs on Admission: I have personally reviewed following labs and imaging studies  CBC: Recent Labs  Lab 03/08/19 1017  WBC 8.6  HGB 12.3*  HCT 38.8*  MCV 96.8  PLT 623*   Basic Metabolic Panel: Recent Labs  Lab 03/08/19 1017  NA 138  K 4.2  CL 107  CO2 25  GLUCOSE 92  BUN 16  CREATININE 0.96  CALCIUM 9.1   GFR: Estimated Creatinine Clearance: 46.3 mL/min (by C-G formula based on SCr of 0.96 mg/dL). Liver Function Tests: No results for input(s): AST, ALT, ALKPHOS, BILITOT, PROT, ALBUMIN in the last 168 hours. No results for input(s): LIPASE, AMYLASE in the last 168 hours. No results for input(s): AMMONIA in the last 168 hours. Coagulation Profile: No results for input(s): INR, PROTIME in the last 168 hours. Cardiac Enzymes: No results for input(s): CKTOTAL, CKMB, CKMBINDEX, TROPONINI in the last 168 hours. BNP (last 3 results) No  results for input(s): PROBNP in the last 8760  hours. HbA1C: No results for input(s): HGBA1C in the last 72 hours. CBG: Recent Labs  Lab 03/08/19 1034  GLUCAP 85   Lipid Profile: No results for input(s): CHOL, HDL, LDLCALC, TRIG, CHOLHDL, LDLDIRECT in the last 72 hours. Thyroid Function Tests: No results for input(s): TSH, T4TOTAL, FREET4, T3FREE, THYROIDAB in the last 72 hours. Anemia Panel: No results for input(s): VITAMINB12, FOLATE, FERRITIN, TIBC, IRON, RETICCTPCT in the last 72 hours. Urine analysis:    Component Value Date/Time   COLORURINE YELLOW 03/08/2019 1101   APPEARANCEUR CLOUDY (A) 03/08/2019 1101   LABSPEC 1.009 03/08/2019 1101   PHURINE 6.0 03/08/2019 1101   GLUCOSEU NEGATIVE 03/08/2019 1101   HGBUR MODERATE (A) 03/08/2019 1101   HGBUR trace-lysed 02/07/2008 1101   BILIRUBINUR NEGATIVE 03/08/2019 1101   KETONESUR NEGATIVE 03/08/2019 1101   PROTEINUR 100 (A) 03/08/2019 1101   UROBILINOGEN 0.2 02/07/2008 1101   NITRITE NEGATIVE 03/08/2019 1101   LEUKOCYTESUR LARGE (A) 03/08/2019 1101    Radiological Exams on Admission: Dg Chest Portable 1 View  Result Date: 03/08/2019 CLINICAL DATA:  Left-sided chest pain. Symptoms intermittently for 3 weeks. EXAM: PORTABLE CHEST 1 VIEW COMPARISON:  Chest x-ray 09/14/2018 FINDINGS: The heart is enlarged. Mild tortuosity of the thoracic aorta. The pulmonary hila appear normal. Mild chronic bronchitic type changes but no infiltrates, edema or effusions. The bony thorax is intact. IMPRESSION: Cardiac enlargement and chronic pulmonary changes but no acute pulmonary findings. Electronically Signed   By: Marijo Sanes M.D.   On: 03/08/2019 10:38    EKG: Independently reviewed. Afib 48bpm.  Assessment/Plan Active Problems:   Hypertensive urgency    Hypertensive urgency with associated chest pain -Appreciate cardiology evaluation -Continue on Norvasc, lisinopril and hold Lopressor due to bradycardia -Start on hydralazine 50 mg 3 times daily -IV hydralazine as needed for  significant elevations  History of CAD -Continue home medications  Bradycardia -Likely secondary to Lopressor which will currently be held  Atrial fibrillation -Currently with slow rhythm and will hold beta-blocker -Remain on Eliquis for anticoagulation   DVT prophylaxis: Eliquis Code Status: Full Family Communication: Spoke with son on phone Disposition Plan:Observe for BP control Consults called:Cardiology Admission status: Obs, SDU   Pratik Darleen Crocker DO [facility] Pager [phone]  If 7PM-7AM, please contact night-coverage [email or web address] Password [email or web address]  03/08/2019, 3:10 PM

## 2019-03-09 ENCOUNTER — Encounter: Payer: Self-pay | Admitting: Family Medicine

## 2019-03-09 DIAGNOSIS — R0789 Other chest pain: Secondary | ICD-10-CM | POA: Diagnosis not present

## 2019-03-09 DIAGNOSIS — N3001 Acute cystitis with hematuria: Secondary | ICD-10-CM

## 2019-03-09 DIAGNOSIS — I160 Hypertensive urgency: Secondary | ICD-10-CM | POA: Diagnosis not present

## 2019-03-09 DIAGNOSIS — R001 Bradycardia, unspecified: Secondary | ICD-10-CM | POA: Diagnosis not present

## 2019-03-09 LAB — BASIC METABOLIC PANEL
Anion gap: 9 (ref 5–15)
BUN: 18 mg/dL (ref 8–23)
CO2: 24 mmol/L (ref 22–32)
Calcium: 9.1 mg/dL (ref 8.9–10.3)
Chloride: 108 mmol/L (ref 98–111)
Creatinine, Ser: 1.03 mg/dL (ref 0.61–1.24)
GFR calc Af Amer: >60 mL/min (ref >60)
GFR calc non Af Amer: >60 mL/min (ref >60)
Glucose, Bld: 96 mg/dL (ref 70–99)
Potassium: 4 mmol/L (ref 3.5–5.1)
Sodium: 141 mmol/L (ref 135–145)

## 2019-03-09 LAB — CBC
HCT: 38.5 % — ABNORMAL LOW (ref 39.0–52.0)
Hemoglobin: 12 g/dL — ABNORMAL LOW (ref 13.0–17.0)
MCH: 30.4 pg (ref 26.0–34.0)
MCHC: 31.2 g/dL (ref 30.0–36.0)
MCV: 97.5 fL (ref 80.0–100.0)
Platelets: 135 10*3/uL — ABNORMAL LOW (ref 150–400)
RBC: 3.95 MIL/uL — ABNORMAL LOW (ref 4.22–5.81)
RDW: 14.2 % (ref 11.5–15.5)
WBC: 9.5 10*3/uL (ref 4.0–10.5)
nRBC: 0 % (ref 0.0–0.2)

## 2019-03-09 MED ORDER — HYDRALAZINE HCL 25 MG PO TABS
25.0000 mg | ORAL_TABLET | Freq: Once | ORAL | Status: AC
  Administered 2019-03-09: 25 mg via ORAL
  Filled 2019-03-09: qty 1

## 2019-03-09 MED ORDER — APIXABAN 5 MG PO TABS: 5.0000 mg | ORAL_TABLET | Freq: Two times a day (BID) | ORAL | 1 refills | Status: DC

## 2019-03-09 MED ORDER — HYDRALAZINE HCL 25 MG PO TABS: 75.0000 mg | ORAL_TABLET | Freq: Three times a day (TID) | ORAL | 3 refills | Status: DC

## 2019-03-09 MED ORDER — HYDRALAZINE HCL 25 MG PO TABS: 75.0000 mg | ORAL_TABLET | Freq: Three times a day (TID) | ORAL | Status: DC

## 2019-03-09 NOTE — Progress Notes (Signed)
Progress Note  Patient Name: Eric [name]. Date of Encounter: 03/09/2019  Primary Cardiologist: Dr Harl Bowie  Subjective   No complaints  Inpatient Medications    Scheduled Meds: . amLODipine  10 mg Oral Daily  . apixaban  5 mg Oral BID  . cholecalciferol  1,000 Units Oral 2 times weekly  . ferrous sulfate  325 mg Oral TID WC  . finasteride  5 mg Oral Daily  . hydrALAZINE  50 mg Oral Q8H  . lisinopril  20 mg Oral Daily  . omega-3 acid ethyl esters  1,000 mg Oral Daily  . pentoxifylline  400 mg Oral TID WC  . rosuvastatin  40 mg Oral Daily  . saccharomyces boulardii  250 mg Oral Daily  . sodium chloride flush  3 mL Intravenous Q12H  . vitamin B-12  500 mcg Oral Daily   Continuous Infusions: . sodium chloride     PRN Meds: sodium chloride, acetaminophen **OR** acetaminophen, acetaminophen, hydrALAZINE, ondansetron **OR** ondansetron (ZOFRAN) IV, sodium chloride flush   Vital Signs    Vitals:   03/09/19 0500 03/09/19 0535 03/09/19 0600 03/09/19 0800  BP: (!) 176/79 (!) 176/79 (!) 194/72 (!) 163/78  Pulse: 62  60 (!) 53  Resp: 11  13 14   Temp:      TempSrc:      SpO2: 98%  98% 98%  Weight:      Height:        Intake/Output Summary (Last 24 hours) at 03/09/2019 0820 Last data filed at 03/08/2019 1700 Gross per 24 hour  Intake -  Output 200 ml  Net -200 ml   Last 3 Weights 03/08/2019 03/08/2019 02/09/2019  Weight (lbs) 155 lb 6.8 oz 160 lb 168 lb 12.8 oz  Weight (kg) 70.5 kg 72.576 kg 76.567 kg      Telemetry    afib 60s-80s - Personally Reviewed  ECG    n/a - Personally Reviewed  Physical Exam   GEN: No acute distress.   Neck: No JVD Cardiac: irreg, no murmurs, rubs, or gallops.  Respiratory: Clear to auscultation bilaterally. GI: Soft, nontender, non-distended  MS: No edema; No deformity. Neuro:  Nonfocal  Psych: Normal affect   Labs    High Sensitivity Troponin:   Recent Labs  Lab 03/08/19 1017 03/08/19 1232 03/08/19 1541   TROPONINIHS 12 12 11       Cardiac EnzymesNo results for input(s): TROPONINI in the last 168 hours. No results for input(s): TROPIPOC in the last 168 hours.   Chemistry Recent Labs  Lab 03/08/19 1017 03/09/19 0427  NA 138 141  K 4.2 4.0  CL 107 108  CO2 25 24  GLUCOSE 92 96  BUN 16 18  CREATININE 0.96 1.03  CALCIUM 9.1 9.1  GFRNONAA >60 >60  GFRAA >60 >60  ANIONGAP 6 9     Hematology Recent Labs  Lab 03/08/19 1017 03/09/19 0427  WBC 8.6 9.5  RBC 4.01* 3.95*  HGB 12.3* 12.0*  HCT 38.8* 38.5*  MCV 96.8 97.5  MCH 30.7 30.4  MCHC 31.7 31.2  RDW 14.5 14.2  PLT 133* 135*    BNPNo results for input(s): BNP, PROBNP in the last 168 hours.   DDimer No results for input(s): DDIMER in the last 168 hours.   Radiology    Dg Chest Portable 1 View  Result Date: 03/08/2019 CLINICAL DATA:  Left-sided chest pain. Symptoms intermittently for 3 weeks. EXAM: PORTABLE CHEST 1 VIEW COMPARISON:  Chest x-ray 09/14/2018 FINDINGS: The heart is  enlarged. Mild tortuosity of the thoracic aorta. The pulmonary hila appear normal. Mild chronic bronchitic type changes but no infiltrates, edema or effusions. The bony thorax is intact. IMPRESSION: Cardiac enlargement and chronic pulmonary changes but no acute pulmonary findings. Electronically Signed   By: Marijo Sanes M.D.   On: 03/08/2019 10:38    Cardiac Studies    Patient Profile     Eric Lant. is a 85 y.o. male with a hx of CAD, HTN, afib who is being seen today for the evaluation of severe HTN and ches tpain at the request of Dr Roderic Palau.  Assessment & Plan    1. Chest pain/CAD - hstrops neg x 3, EKG without acute ischemic changes - symptoms not overally consistent with ischemia - I supsect related to his severe HTN  - no recurrent pain since admission - no further ischemic workup planned at this time    2. HTN - severe HTN with SBPs >200s at home, here have been in the 190s -historically renal function has been  labile, I would avoid aggressive titration of bp agents that may affect his renal function, Cr as high as 1.6 a year ago - he is on norvasc 10, lisinopril 20, hydral 50mg  tid (started this admit)  - bp's remain elevated, increase hydral to 75mg  tid.     3. Afib/bradycardia - some rates high 30s in ER. He denies any significant symptoms. On lopressor 12.5mg  bid at home,we stopped on admission. Rates 60s-80s. Continue to monitor for any potential rebound of heart rates and afib. - continue eliquis, he has been on renally dosed eliquis based on prior renal function. Based on current function we have increased to 5mg  bid.    Monitor heart rates and bp's into the afternoon, possibly dishcharge later today if not then likely tomorrow   For questions or updates, please contact [place] Please consult [email or web address] for contact info under        Signed, Carlyle Dolly, MD  03/09/2019, 8:20 AM

## 2019-03-09 NOTE — Discharge Summary (Signed)
Physician Discharge Summary  Eric [name]. HUT:[id number] DOB: 1927/09/23 DOA: 03/08/2019  PCP: [name], MD  Admit date: 03/08/2019  Discharge date: 03/09/2019  Admitted From:Home  Disposition:  Home  Recommendations for Outpatient Follow-up:  1. Follow up with PCP in 1-2 weeks 2. Follow-up with cardiology as scheduled 3. Discontinue metoprolol 4. Continue Eliquis 5 mg twice daily as prescribed 5. Continue hydralazine 75 mg 3 times daily as prescribed  Home Health: None  Equipment/Devices: None  Discharge Condition: Stable  CODE STATUS: Full  Diet recommendation: Heart Healthy/carb modified  Brief/Interim Summary: Per HPI: Eric [name]. is a 85 y.o. male with medical history significant for CAD with prior CABG in 2000, permanent atrial fibrillation on Eliquis, CKD stage III, type 2 diabetes, PAD with left BKA, dyslipidemia, and hypertension who presented to the emergency department with significantly elevated blood pressure readings of systolics over 656 at home.  He has noted intermittent episodes of left substernal chest pain without any radiation.  He denies any associated dyspnea, diaphoresis, nausea, vomiting and states that the pain does not appear to be exertional.  He seems to have episodes that last 20 to 30 minutes at a time and has been taking all of his blood pressure medications as otherwise prescribed.  He denies any lightheadedness or dizziness.  Patient was admitted with hypertensive urgency with associated intermittent chest pain that has resolved since admission.  His blood pressures have been much better controlled on hydralazine that was initially started at 50 mg 3 times daily and has been increased to 75 mg 3 times daily for better control.  He appears to be doing quite well and otherwise asymptomatic at this time.  ACS work-up has been negative for any acute findings at this time and he has been seen by cardiology.  He was noted to initially  have some bradycardia with heart rates in the 30 to 40 bpm range which has now improved into the 60 bpm range after discontinuation of Lopressor.  Lopressor will not be discontinued on discharge given his bradycardia and he will remain on hydralazine and will need close follow-up with PCP to recheck heart rate and blood pressure as well as cardiology.  His atrial fibrillation continues to persist at a slow rate and his Eliquis dose has been increased to 5 mg twice daily given good renal function.  Discharge Diagnoses:  Active Problems:   Hypertensive urgency  Principal discharge diagnosis: Atypical chest pain secondary to hypertensive urgency.  Discharge Instructions  Discharge Instructions    Diet - low sodium heart healthy   Complete by: As directed    Increase activity slowly   Complete by: As directed      Allergies as of 03/09/2019      Reactions   Isosorbide Mononitrate Other (See Comments)   Unknown, doesn't remember    Ramipril Cough   Quinidine Gluconate Rash      Medication List    STOP taking these medications   metoprolol tartrate 25 MG tablet Commonly known as: LOPRESSOR     TAKE these medications   acetaminophen 325 MG tablet Commonly known as: TYLENOL Take 2 tablets (650 mg total) by mouth every 6 (six) hours as needed for mild pain (or temp >/= 101 F).   amLODipine 10 MG tablet Commonly known as: NORVASC Take 1 tablet by mouth once daily   apixaban 5 MG Tabs tablet Commonly known as: ELIQUIS Take 1 tablet (5 mg total) by mouth 2 (two)  times daily. What changed:   medication strength  how much to take   B Complex-B12 Tabs Take 1 tablet by mouth daily.   cholecalciferol 1000 units tablet Commonly known as: VITAMIN D Take 1,000 Units by mouth 2 (two) times a week. Takes on Mon and Fri   Cinnamon 500 MG capsule Take 1,000 mg by mouth 2 (two) times daily.   DAILY MULTIVITAMIN PO Take 1 tablet by mouth daily.   ferrous sulfate 325 (65 FE) MG  EC tablet Take 325 mg by mouth 3 (three) times daily with meals.   finasteride 5 MG tablet Commonly known as: PROSCAR TAKE 1 TABLET BY MOUTH ONCE DAILY   fish oil-omega-3 fatty acids 1000 MG capsule Take 1,000 mg by mouth daily.   hydrALAZINE 25 MG tablet Commonly known as: APRESOLINE Take 3 tablets (75 mg total) by mouth every 8 (eight) hours.   lisinopril 20 MG tablet Commonly known as: ZESTRIL Take 1 tablet (20 mg total) by mouth daily.   ondansetron 4 MG tablet Commonly known as: ZOFRAN Take 4 mg by mouth every 6 (six) hours as needed for nausea or vomiting.   pentoxifylline 400 MG CR tablet Commonly known as: TRENTAL TAKE 1 TABLET BY MOUTH THREE TIMES DAILY WITH MEALS   rosuvastatin 40 MG tablet Commonly known as: CRESTOR Take 1 tablet (40 mg total) by mouth daily.   saccharomyces boulardii 250 MG capsule Commonly known as: Florastor Take 1 capsule (250 mg total) by mouth daily.      Follow-up Information    [name], MD Follow up in 1 week(s).   Specialty: Family Medicine Contact information: [address] [place] [address] [phone]          Allergies  Allergen Reactions  . Isosorbide Mononitrate Other (See Comments)    Unknown, doesn't remember   . Ramipril Cough  . Quinidine Gluconate Rash    Consultations:  Cardiology   Procedures/Studies: Dg Chest Portable 1 View  Result Date: 03/08/2019 CLINICAL DATA:  Left-sided chest pain. Symptoms intermittently for 3 weeks. EXAM: PORTABLE CHEST 1 VIEW COMPARISON:  Chest x-ray 09/14/2018 FINDINGS: The heart is enlarged. Mild tortuosity of the thoracic aorta. The pulmonary hila appear normal. Mild chronic bronchitic type changes but no infiltrates, edema or effusions. The bony thorax is intact. IMPRESSION: Cardiac enlargement and chronic pulmonary changes but no acute pulmonary findings. Electronically Signed   By: Marijo Sanes M.D.   On: 03/08/2019 10:38     Discharge  Exam: Vitals:   03/09/19 0800 03/09/19 0944  BP: (!) 163/78 (!) 112/58  Pulse: (!) 53   Resp: 14 18  Temp: 98 F (36.7 C)   SpO2: 98%    Vitals:   03/09/19 0535 03/09/19 0600 03/09/19 0800 03/09/19 0944  BP: (!) 176/79 (!) 194/72 (!) 163/78 (!) 112/58  Pulse:  60 (!) 53   Resp:  13 14 18   Temp:   98 F (36.7 C)   TempSrc:   Oral   SpO2:  98% 98%   Weight:      Height:        General: Pt is alert, awake, not in acute distress Cardiovascular: Irregular, S1/S2 +, no rubs, no gallops Respiratory: CTA bilaterally, no wheezing, no rhonchi, currently on room air Abdominal: Soft, NT, ND, bowel sounds + Extremities: no edema, no cyanosis    The results of significant diagnostics from this hospitalization (including imaging, microbiology, ancillary and laboratory) are listed below for reference.  Microbiology: Recent Results (from the past 240 hour(s))  SARS Coronavirus 2 (CEPHEID - Performed in [place] hospital lab), Hosp Order     Status: None   Collection Time: 03/08/19  1:57 PM   Specimen: Nasopharyngeal Swab  Result Value Ref Range Status   SARS Coronavirus 2 NEGATIVE NEGATIVE Final    Comment: (NOTE) If result is NEGATIVE SARS-CoV-2 target nucleic acids are NOT DETECTED. The SARS-CoV-2 RNA is generally detectable in upper and lower  respiratory specimens during the acute phase of infection. The lowest  concentration of SARS-CoV-2 viral copies this assay can detect is 250  copies / mL. A negative result does not preclude SARS-CoV-2 infection  and should not be used as the sole basis for treatment or other  patient management decisions.  A negative result may occur with  improper specimen collection / handling, submission of specimen other  than nasopharyngeal swab, presence of viral mutation(s) within the  areas targeted by this assay, and inadequate number of viral copies  (<250 copies / mL). A negative result must be combined with clinical  observations,  patient history, and epidemiological information. If result is POSITIVE SARS-CoV-2 target nucleic acids are DETECTED. The SARS-CoV-2 RNA is generally detectable in upper and lower  respiratory specimens dur ing the acute phase of infection.  Positive  results are indicative of active infection with SARS-CoV-2.  Clinical  correlation with patient history and other diagnostic information is  necessary to determine patient infection status.  Positive results do  not rule out bacterial infection or co-infection with other viruses. If result is PRESUMPTIVE POSTIVE SARS-CoV-2 nucleic acids MAY BE PRESENT.   A presumptive positive result was obtained on the submitted specimen  and confirmed on repeat testing.  While 2019 novel coronavirus  (SARS-CoV-2) nucleic acids may be present in the submitted sample  additional confirmatory testing may be necessary for epidemiological  and / or clinical management purposes  to differentiate between  SARS-CoV-2 and other Sarbecovirus currently known to infect humans.  If clinically indicated additional testing with an alternate test  methodology [phone]) is advised. The SARS-CoV-2 RNA is generally  detectable in upper and lower respiratory sp ecimens during the acute  phase of infection. The expected result is Negative. Fact Sheet for Patients:  StrictlyIdeas.no Fact Sheet for Healthcare Providers: BankingDealers.co.za This test is not yet approved or cleared by the [place] FDA and has been authorized for detection and/or diagnosis of SARS-CoV-2 by FDA under an Emergency Use Authorization (EUA).  This EUA will remain in effect (meaning this test can be used) for the duration of the COVID-19 declaration under Section 564(b)(1) of the Act, 21 U.S.C. section 360bbb-3(b)(1), unless the authorization is terminated or revoked sooner. Performed at [facility], [address] [place], [place] [address]    MRSA PCR Screening     Status: None   Collection Time: 03/08/19  4:16 PM   Specimen: Nasal Mucosa; Nasopharyngeal  Result Value Ref Range Status   MRSA by PCR NEGATIVE NEGATIVE Final    Comment:        The GeneXpert MRSA Assay (FDA approved for NASAL specimens only), is one component of a comprehensive MRSA colonization surveillance program. It is not intended to diagnose MRSA infection nor to guide or monitor treatment for MRSA infections. Performed at [facility], [address], [place], [place] [address]      Labs: BNP (last 3 results) No results for input(s): BNP in the last 8760 hours. Basic Metabolic Panel: Recent  Labs  Lab 03/08/19 1017 03/09/19 0427  NA 138 141  K 4.2 4.0  CL 107 108  CO2 25 24  GLUCOSE 92 96  BUN 16 18  CREATININE 0.96 1.03  CALCIUM 9.1 9.1   Liver Function Tests: No results for input(s): AST, ALT, ALKPHOS, BILITOT, PROT, ALBUMIN in the last 168 hours. No results for input(s): LIPASE, AMYLASE in the last 168 hours. No results for input(s): AMMONIA in the last 168 hours. CBC: Recent Labs  Lab 03/08/19 1017 03/09/19 0427  WBC 8.6 9.5  HGB 12.3* 12.0*  HCT 38.8* 38.5*  MCV 96.8 97.5  PLT 133* 135*   Cardiac Enzymes: No results for input(s): CKTOTAL, CKMB, CKMBINDEX, TROPONINI in the last 168 hours. BNP: Invalid input(s): POCBNP CBG: Recent Labs  Lab 03/08/19 1034  GLUCAP 85   D-Dimer No results for input(s): DDIMER in the last 72 hours. Hgb A1c No results for input(s): HGBA1C in the last 72 hours. Lipid Profile No results for input(s): CHOL, HDL, LDLCALC, TRIG, CHOLHDL, LDLDIRECT in the last 72 hours. Thyroid function studies No results for input(s): TSH, T4TOTAL, T3FREE, THYROIDAB in the last 72 hours.  Invalid input(s): FREET3 Anemia work up No results for input(s): VITAMINB12, FOLATE, FERRITIN, TIBC, IRON, RETICCTPCT in the last 72 hours. Urinalysis    Component Value Date/Time   COLORURINE YELLOW 03/08/2019 1101    APPEARANCEUR CLOUDY (A) 03/08/2019 1101   LABSPEC 1.009 03/08/2019 1101   PHURINE 6.0 03/08/2019 1101   GLUCOSEU NEGATIVE 03/08/2019 1101   HGBUR MODERATE (A) 03/08/2019 1101   HGBUR trace-lysed 02/07/2008 1101   BILIRUBINUR NEGATIVE 03/08/2019 1101   KETONESUR NEGATIVE 03/08/2019 1101   PROTEINUR 100 (A) 03/08/2019 1101   UROBILINOGEN 0.2 02/07/2008 1101   NITRITE NEGATIVE 03/08/2019 1101   LEUKOCYTESUR LARGE (A) 03/08/2019 1101   Sepsis Labs Invalid input(s): PROCALCITONIN,  WBC,  LACTICIDVEN Microbiology Recent Results (from the past 240 hour(s))  SARS Coronavirus 2 (CEPHEID - Performed in [place] hospital lab), Hosp Order     Status: None   Collection Time: 03/08/19  1:57 PM   Specimen: Nasopharyngeal Swab  Result Value Ref Range Status   SARS Coronavirus 2 NEGATIVE NEGATIVE Final    Comment: (NOTE) If result is NEGATIVE SARS-CoV-2 target nucleic acids are NOT DETECTED. The SARS-CoV-2 RNA is generally detectable in upper and lower  respiratory specimens during the acute phase of infection. The lowest  concentration of SARS-CoV-2 viral copies this assay can detect is 250  copies / mL. A negative result does not preclude SARS-CoV-2 infection  and should not be used as the sole basis for treatment or other  patient management decisions.  A negative result may occur with  improper specimen collection / handling, submission of specimen other  than nasopharyngeal swab, presence of viral mutation(s) within the  areas targeted by this assay, and inadequate number of viral copies  (<250 copies / mL). A negative result must be combined with clinical  observations, patient history, and epidemiological information. If result is POSITIVE SARS-CoV-2 target nucleic acids are DETECTED. The SARS-CoV-2 RNA is generally detectable in upper and lower  respiratory specimens dur ing the acute phase of infection.  Positive  results are indicative of active infection with SARS-CoV-2.   Clinical  correlation with patient history and other diagnostic information is  necessary to determine patient infection status.  Positive results do  not rule out bacterial infection or co-infection with other viruses. If result is PRESUMPTIVE POSTIVE SARS-CoV-2 nucleic acids MAY BE  PRESENT.   A presumptive positive result was obtained on the submitted specimen  and confirmed on repeat testing.  While 2019 novel coronavirus  (SARS-CoV-2) nucleic acids may be present in the submitted sample  additional confirmatory testing may be necessary for epidemiological  and / or clinical management purposes  to differentiate between  SARS-CoV-2 and other Sarbecovirus currently known to infect humans.  If clinically indicated additional testing with an alternate test  methodology [phone]) is advised. The SARS-CoV-2 RNA is generally  detectable in upper and lower respiratory sp ecimens during the acute  phase of infection. The expected result is Negative. Fact Sheet for Patients:  StrictlyIdeas.no Fact Sheet for Healthcare Providers: BankingDealers.co.za This test is not yet approved or cleared by the [place] FDA and has been authorized for detection and/or diagnosis of SARS-CoV-2 by FDA under an Emergency Use Authorization (EUA).  This EUA will remain in effect (meaning this test can be used) for the duration of the COVID-19 declaration under Section 564(b)(1) of the Act, 21 U.S.C. section 360bbb-3(b)(1), unless the authorization is terminated or revoked sooner. Performed at [facility] [address], [place], [place] [address]   MRSA PCR Screening     Status: None   Collection Time: 03/08/19  4:16 PM   Specimen: Nasal Mucosa; Nasopharyngeal  Result Value Ref Range Status   MRSA by PCR NEGATIVE NEGATIVE Final    Comment:        The GeneXpert MRSA Assay (FDA approved for NASAL specimens only), is one component of a comprehensive MRSA  colonization surveillance program. It is not intended to diagnose MRSA infection nor to guide or monitor treatment for MRSA infections. Performed at [facility], [address] [place], [place] [address]      Time coordinating discharge: 35 minutes  SIGNED:   Rodena Goldmann, DO [facility] 03/09/2019, 10:54 AM  If 7PM-7AM, please contact night-coverage [email or web address] Password [email or web address]

## 2019-03-09 NOTE — Progress Notes (Signed)
Patient received discharge instructions. Patient verbalized understanding. Patient's blood pressure stable. One peripheral IV removed from patient prior to discharge. Patient to be picked up by wife at short stay to be discharged to home.

## 2019-03-10 ENCOUNTER — Telehealth (HOSPITAL_COMMUNITY): Payer: Self-pay | Admitting: Emergency Medicine

## 2019-03-10 LAB — URINE CULTURE: Culture: >=100000 — AB

## 2019-03-10 MED ORDER — CIPROFLOXACIN HCL 500 MG PO TABS: 500.0000 mg | ORAL_TABLET | Freq: Two times a day (BID) | ORAL | 0 refills | Status: DC

## 2019-03-10 NOTE — Telephone Encounter (Signed)
Pt was discharged from inpatient status here yesterday, today urine culture results positive for pseudomonas.  He was not tx with abx while here.  Spoke with pt at home who denies dysuria but has had increased urinary frequency.  No fevers. Prescribed cipro 500 mg bid x 5 days to his pharmacy. He was advised to pick up this medication.  Has f/u with his pcp in 7 days.

## 2019-03-16 ENCOUNTER — Ambulatory Visit (INDEPENDENT_AMBULATORY_CARE_PROVIDER_SITE_OTHER): Payer: PPO | Admitting: Family Medicine

## 2019-03-16 ENCOUNTER — Other Ambulatory Visit: Payer: Self-pay

## 2019-03-16 VITALS — BP 130/60 | HR 92 | Temp 97.9°F | Resp 14

## 2019-03-16 DIAGNOSIS — Z09 Encounter for follow-up examination after completed treatment for conditions other than malignant neoplasm: Secondary | ICD-10-CM | POA: Diagnosis not present

## 2019-03-16 DIAGNOSIS — I4819 Other persistent atrial fibrillation: Secondary | ICD-10-CM

## 2019-03-16 DIAGNOSIS — I10 Essential (primary) hypertension: Secondary | ICD-10-CM

## 2019-03-16 NOTE — Progress Notes (Signed)
Subjective:    Patient ID: Eric [name]., male    DOB: 1928-06-29, 85 y.o.   MRN: [id number]  Patient was recently admitted to the hospital with uncontrolled hypertension and bradycardia.  I have copied relevant portions of his discharge summary below for my reference but essentially the patient was placed on hydralazine 75 mg p.o. 3 times daily which manage his blood pressure well, his Lopressor was discontinued due to bradycardia, and his Eliquis was increased to 5 mg twice daily..  Admit date: 03/08/2019  Discharge date: 03/09/2019  Admitted From:Home  Disposition:  Home  Recommendations for Outpatient Follow-up:  1. Follow up with PCP in 1-2 weeks 2. Follow-up with cardiology as scheduled 3. Discontinue metoprolol 4. Continue Eliquis 5 mg twice daily as prescribed 5. Continue hydralazine 75 mg 3 times daily as prescribed  Home Health: None  Equipment/Devices: None  Discharge Condition: Stable  CODE STATUS: Full  Diet recommendation: Heart Healthy/carb modified  Brief/Interim Summary: Per HPI: RISHARD DELANGE Mckayis a 85 y.o.malewith medical history significant forCAD with prior CABG in 2000, permanent atrial fibrillation on Eliquis, CKD stage III, type 2 diabetes, PAD with left BKA, dyslipidemia, and hypertension who presented to the emergency department with significantly elevated blood pressure readings of systolics over 409 at home. He has noted intermittent episodes of left substernal chest pain without any radiation. He denies any associated dyspnea, diaphoresis, nausea, vomiting and states that the pain does not appear to be exertional. He seems to have episodes that last 20 to 30 minutes at a time and has been taking all of his blood pressure medications as otherwise prescribed. He denies any lightheadedness or dizziness.  Patient was admitted with hypertensive urgency with associated intermittent chest pain that has resolved since admission.  His  blood pressures have been much better controlled on hydralazine that was initially started at 50 mg 3 times daily and has been increased to 75 mg 3 times daily for better control.  He appears to be doing quite well and otherwise asymptomatic at this time.  ACS work-up has been negative for any acute findings at this time and he has been seen by cardiology.  He was noted to initially have some bradycardia with heart rates in the 30 to 40 bpm range which has now improved into the 60 bpm range after discontinuation of Lopressor.  Lopressor will not be discontinued on discharge given his bradycardia and he will remain on hydralazine and will need close follow-up with PCP to recheck heart rate and blood pressure as well as cardiology.  His atrial fibrillation continues to persist at a slow rate and his Eliquis dose has been increased to 5 mg twice daily given good renal function.  Discharge Diagnoses:  Active Problems:   Hypertensive urgency  Principal discharge diagnosis: Atypical chest pain secondary to hypertensive urgency.  03/16/19 He is here today for discharge follow-up.  Since he has been home from the hospital the last week, he has had 2 instances where he is felt his heart race.  He stated that one time his heart rate got up into the 120s and he can actually feel it beating quickly.  However this subsided on its own without any intervention.  Since that time he is randomly checked his heart rate when he checks his blood pressure with his blood pressure cuff.  He seen it as high as in the 90s.  Today his heart rate manually checked for 60 seconds is 78 to 80 bpm.  Obviously his heart rate is irregular due to the atrial fibrillation.  He denies any chest pain or shortness of breath or dyspnea on exertion.  He denies any paroxysmal nocturnal dyspnea, orthopnea, or angina.  He denies any syncope.  He denies any lightheadedness.  He denies any presyncope.  He denies any bleeding or bruising since  increasing his Eliquis to 5 mg twice daily.  He did fall recently at the grocery store and landed on his 2 arms outstretched.  He suffered a bruise to the tip of his nose.  He also suffered a bruise over his left lateral epicondyle and also his base of his left thumb.  He denies any loss of consciousness.  He denies any headache, blurry vision, nausea or vomiting. Past Medical History:  Diagnosis Date   Atrial flutter ([place])    s/p RFCA   CAD (coronary artery disease)    cath 2003, occluded S-RCA, occluded S-Dx, L-LAD ok, s/p PTCA to LAD   Cataract    CKD (chronic kidney disease) stage 3, GFR 30-59 ml/min (HCC)    Diabetes mellitus    diet controlled   Hearing aid worn    bilateral   History of kidney stones    Long term (current) use of anticoagulants    Neuromuscular disorder (HCC)    OSA (obstructive sleep apnea) 12/11   very mild, AHI 7/hr   Persistent atrial fibrillation 09/26/2015   Pure hyperglyceridemia    PVD (peripheral vascular disease) ([place])    angioplasty of his right lower extremity in [place] by Dr.[name] 2013   Unspecified essential hypertension    Wears dentures    full upper and lower   Past Surgical History:  Procedure Laterality Date   ABDOMINAL AORTOGRAM W/LOWER EXTREMITY N/A 11/15/2017   Procedure: ABDOMINAL AORTOGRAM W/LOWER EXTREMITY;  Surgeon: [name], MD;  Location: [facility];  Service: Cardiovascular;  Laterality: N/A;   Adenosine Myoview  3/06   EF 56%, neg. Ischemia   Adenosine Myoview  02/18/07   nml   AMPUTATION Left 12/15/2017   Procedure: AMPUTATION BELOW KNEE;  Surgeon: Algernon [name], MD;  Location: [facility];  Service: Vascular;  Laterality: Left;   ANGIOPLASTY  1/99   CAD- diogonal with rotational artherectomy   Arthrectomy     of LAD & PTCA   BLEPHAROPLASTY Bilateral    CARDIAC CATHETERIZATION  1/00   CARDIOVERSION  1/04   CARDIOVERSION  5/07   hospital- a flutter   CARPAL TUNNEL RELEASE     ?  bilateral   CATARACT EXTRACTION W/PHACO Right 07/28/2017   Procedure: CATARACT EXTRACTION PHACO AND INTRAOCULAR LENS PLACEMENT ([place]) RIGHT DIABETIC;  Surgeon: [name], MD;  Location: [facility];  Service: Ophthalmology;  Laterality: Right;  Diabetic - diet controlled   CATARACT EXTRACTION W/PHACO Left 08/18/2017   Procedure: CATARACT EXTRACTION PHACO AND INTRAOCULAR LENS PLACEMENT ([facility]);  Surgeon: [name], MD;  Location: [facility];  Service: Ophthalmology;  Laterality: Left;  DIABETES - oral meds   COLONOSCOPY W/ BIOPSIES  10/01/06   sigmoid polyp bx neg, 3 years   CORONARY ANGIOPLASTY  4/03   cutting balloon PTCA pLAD into Diag   CORONARY ARTERY BYPASS GRAFT  2000   LIMA-LAD, SVG-RCA, SVG-Diag; SVG-Diag & SVG-RCA occluded 2003   FRACTURE SURGERY     HAND SURGERY  08/27/09   R thumb procedure wit Scaphoid Gragt and screws, Dr [name]   LOWER EXTREMITY ANGIOGRAPHY Right 01/25/2019   Procedure: LOWER EXTREMITY  ANGIOGRAPHY;  Surgeon: Katha Cabal, MD;  Location: [facility];  Service: Cardiovascular;  Laterality: Right;   NM MYOVIEW LTD  4/11   normal   PERIPHERAL VASCULAR CATHETERIZATION Right 06/27/2015   Procedure: Lower Extremity Angiography;  Surgeon: Algernon [name], MD;  Location: [facility];  Service: Cardiovascular;  Laterality: Right;   PERIPHERAL VASCULAR CATHETERIZATION  06/27/2015   Procedure: Lower Extremity Intervention;  Surgeon: Algernon [name], MD;  Location: [facility];  Service: Cardiovascular;;   Current Outpatient Medications on File Prior to Visit  Medication Sig Dispense Refill   acetaminophen (TYLENOL) 325 MG tablet Take 2 tablets (650 mg total) by mouth every 6 (six) hours as needed for mild pain (or temp >/= 101 F).     amLODipine (NORVASC) 10 MG tablet Take 1 tablet by mouth once daily 90 tablet 0   apixaban (ELIQUIS) 5 MG TABS tablet Take 1 tablet (5 mg total) by mouth 2 (two) times  daily. 60 tablet 1   B Complex Vitamins (B COMPLEX-B12) TABS Take 1 tablet by mouth daily.      cholecalciferol (VITAMIN D) 1000 UNITS tablet Take 1,000 Units by mouth 2 (two) times a week. Takes on Mon and Fri     Cinnamon 500 MG capsule Take 1,000 mg by mouth 2 (two) times daily.      ferrous sulfate 325 (65 FE) MG EC tablet Take 325 mg by mouth 3 (three) times daily with meals.     finasteride (PROSCAR) 5 MG tablet TAKE 1 TABLET BY MOUTH ONCE DAILY 90 tablet 3   fish oil-omega-3 fatty acids 1000 MG capsule Take 1,000 mg by mouth daily.      hydrALAZINE (APRESOLINE) 25 MG tablet Take 3 tablets (75 mg total) by mouth every 8 (eight) hours. 270 tablet 3   lisinopril (PRINIVIL,ZESTRIL) 20 MG tablet Take 1 tablet (20 mg total) by mouth daily. 30 tablet 0   Multiple Vitamin (DAILY MULTIVITAMIN PO) Take 1 tablet by mouth daily.       ondansetron (ZOFRAN) 4 MG tablet Take 4 mg by mouth every 6 (six) hours as needed for nausea or vomiting.     pentoxifylline (TRENTAL) 400 MG CR tablet TAKE 1 TABLET BY MOUTH THREE TIMES DAILY WITH MEALS 90 tablet 3   rosuvastatin (CRESTOR) 40 MG tablet Take 1 tablet (40 mg total) by mouth daily. 90 tablet 2   saccharomyces boulardii (FLORASTOR) 250 MG capsule Take 1 capsule (250 mg total) by mouth daily. 90 capsule 3   No current facility-administered medications on file prior to visit.    Allergies  Allergen Reactions   Isosorbide Mononitrate Other (See Comments)    Unknown, doesn't remember    Ramipril Cough   Quinidine Gluconate Rash   Social History   Socioeconomic History   Marital status: Married    Spouse name: Not on file   Number of children: 5   Years of education: Not on file   Highest education level: Not on file  Occupational History   Occupation: [employer]    Employer: RETIRED  Social Designer, fashion/clothing strain: Not on file   Food insecurity    Worry: Not on file    Inability: Not on file    Transportation needs    Medical: Not on file    Non-medical: Not on file  Tobacco Use   Smoking status: Never Smoker   Smokeless tobacco: Former Network engineer and  Sexual Activity   Alcohol use: No   Drug use: No   Sexual activity: Never  Lifestyle   Physical activity    Days per week: Not on file    Minutes per session: Not on file   Stress: Not on file  Relationships   Social connections    Talks on phone: Not on file    Gets together: Not on file    Attends religious service: Not on file    Active member of club or organization: Not on file    Attends meetings of clubs or organizations: Not on file    Relationship status: Not on file   Intimate partner violence    Fear of current or ex partner: Not on file    Emotionally abused: Not on file    Physically abused: Not on file    Forced sexual activity: Not on file  Other Topics Concern   Not on file  Social History Narrative   Retired now does some antiques   Retired from Theatre manager and dye work   Married 1951   5 kids     Review of Systems  Gastrointestinal: Positive for diarrhea.  All other systems reviewed and are negative.      Objective:   Physical Exam Vitals signs reviewed.  Constitutional:      General: He is not in acute distress.    Appearance: He is well-developed. He is not diaphoretic.  Eyes:     General: No scleral icterus.       Right eye: No discharge.        Left eye: No discharge.     Conjunctiva/sclera: Conjunctivae normal.  Neck:     Musculoskeletal: Neck supple.     Vascular: No JVD.  Cardiovascular:     Rate and Rhythm: Normal rate. Rhythm irregular.     Heart sounds: Normal heart sounds.  Pulmonary:     Effort: Pulmonary effort is normal. No tachypnea, bradypnea, prolonged expiration or respiratory distress.     Breath sounds: Normal breath sounds. No stridor or decreased air movement. No wheezing, rhonchi or rales.  Abdominal:     General: Bowel sounds are normal. There is  no distension.     Palpations: Abdomen is soft.     Tenderness: There is no abdominal tenderness. There is no guarding.  Musculoskeletal:        General: Deformity present.  Lymphadenopathy:     Cervical: No cervical adenopathy.   Patient has a history of a left below knee amputation      Assessment & Plan:  The primary encounter diagnosis was Hospital discharge follow-up. Diagnoses of Persistent atrial fibrillation and Essential hypertension were also pertinent to this visit. Spent 25 minutes today with the patient and his wife explaining the situation.  Right now his blood pressure is well controlled after the addition of hydralazine.  Therefore will not make any changes in this.  However this patient is at high risk for tachybradycardia syndrome.  Therefore I recommended that the patient check his heart rate twice a day.  If his heart rate is much higher than 100 he can take a half a tablet of the 25 mg Lopressor tablets he has on an as-needed basis.  He can also check his heart rate anytime he feels it racing and if is greater than 100 take a half a tablet of the Lopressor.  He will email me or call me periodically and give me an update on how often this  occurs.  If this is occurring quite regularly, we will need to permanently resume the Lopressor.  Otherwise we can use it on a as needed basis.  If patient continues to fall, we may need to consider discontinuing Eliquis if the risk outweighs the benefit.  At the present time I feel that the benefit outweighs the risk and so we will continue Eliquis.

## 2019-03-18 ENCOUNTER — Other Ambulatory Visit: Payer: Self-pay | Admitting: Family Medicine

## 2019-03-20 ENCOUNTER — Encounter: Payer: Self-pay | Admitting: Family Medicine

## 2019-03-31 ENCOUNTER — Encounter: Payer: Self-pay | Admitting: Family Medicine

## 2019-04-05 ENCOUNTER — Other Ambulatory Visit: Payer: Self-pay | Admitting: Family Medicine

## 2019-04-12 ENCOUNTER — Other Ambulatory Visit: Payer: Self-pay | Admitting: Family Medicine

## 2019-04-12 ENCOUNTER — Encounter: Payer: Self-pay | Admitting: Family Medicine

## 2019-04-13 ENCOUNTER — Other Ambulatory Visit: Payer: Self-pay

## 2019-04-13 ENCOUNTER — Ambulatory Visit (INDEPENDENT_AMBULATORY_CARE_PROVIDER_SITE_OTHER): Payer: PPO | Admitting: Vascular Surgery

## 2019-04-13 ENCOUNTER — Encounter (INDEPENDENT_AMBULATORY_CARE_PROVIDER_SITE_OTHER): Payer: Self-pay | Admitting: Vascular Surgery

## 2019-04-13 VITALS — BP 161/89 | HR 88 | Resp 14 | Ht 65.0 in | Wt 165.0 lb

## 2019-04-13 DIAGNOSIS — I7025 Atherosclerosis of native arteries of other extremities with ulceration: Secondary | ICD-10-CM | POA: Diagnosis not present

## 2019-04-13 DIAGNOSIS — I2510 Atherosclerotic heart disease of native coronary artery without angina pectoris: Secondary | ICD-10-CM | POA: Diagnosis not present

## 2019-04-13 DIAGNOSIS — I10 Essential (primary) hypertension: Secondary | ICD-10-CM | POA: Diagnosis not present

## 2019-04-13 DIAGNOSIS — I4892 Unspecified atrial flutter: Secondary | ICD-10-CM

## 2019-04-13 IMAGING — MR MR FOOT*L* W/O CM
11 of 14 series · 37 of 40 positions shown · non-contrast
Comparison: Left foot radiographs 11/10/2017

CLINICAL DATA: Pain and swelling.  Great toe ulcer.

EXAM:
MRI OF THE LEFT FOOT WITHOUT CONTRAST
TECHNIQUE: Multiplanar, multisequence MR imaging of the left foot was
performed. No intravenous contrast was administered.

[Series 4001: T1 · axial · left · 2.0mm · 0.47mm/px · z∈[-89,+0]mm · 3 of 43 slices shown (1 of 5)]
[im 1/43]
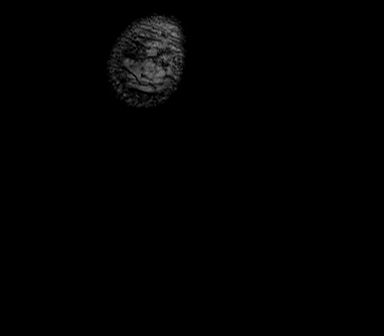
[im 22/43]
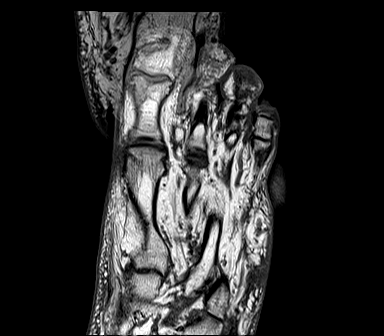
[im 43/43]
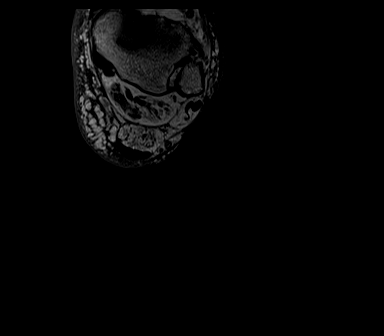

[Series 5001: [id number] · axial · left · 2.0mm · 0.40mm/px · z∈[-94,-5]mm · 4 of 43 slices shown]
[im 1/43]
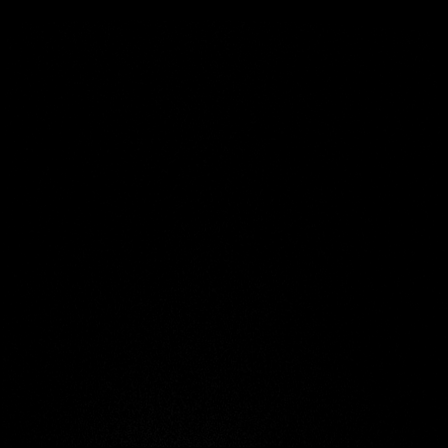
[im 15/43]
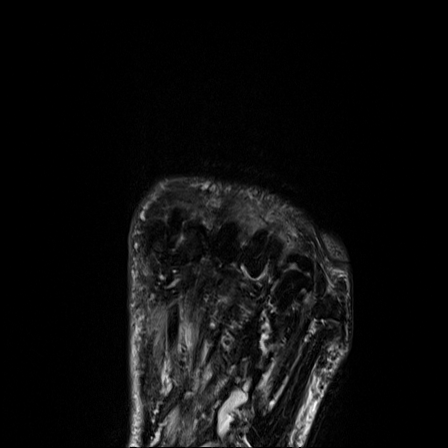
[im 29/43]
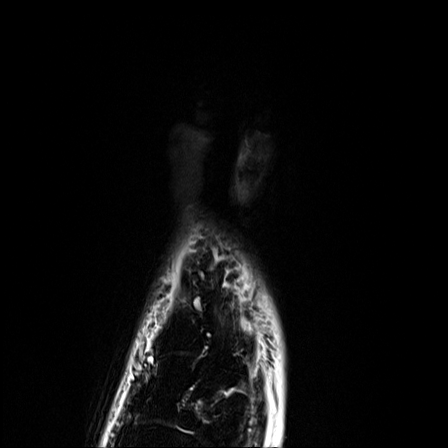
[im 43/43]
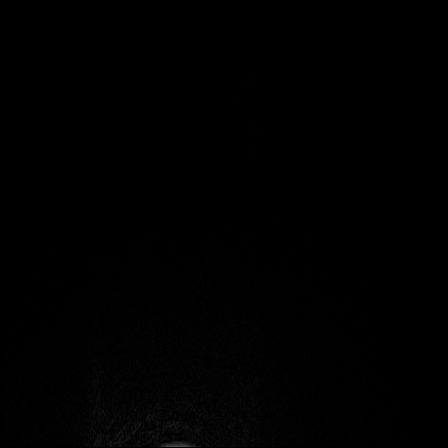

[Series 6001: T1 · axial · left · 2.0mm · 0.47mm/px · z∈[-89,+0]mm · 4 of 43 slices shown (2 of 5)]
[im 1/43]
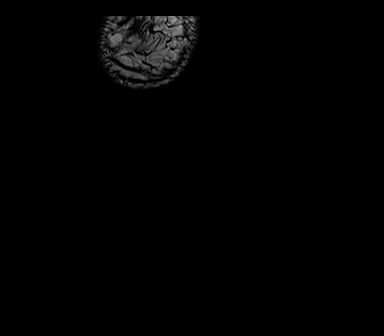
[im 15/43]
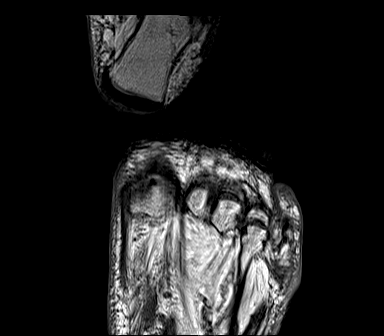
[im 29/43]
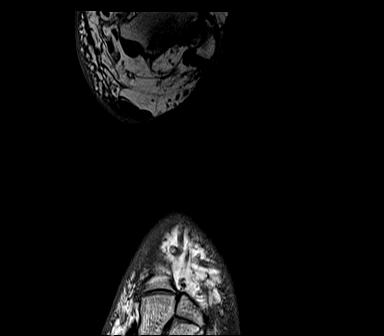
[im 43/43]
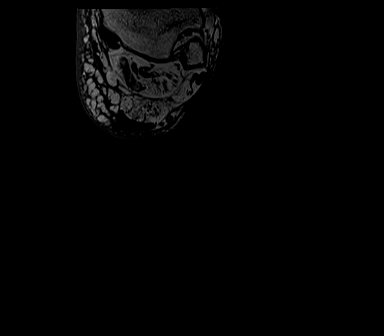

[Series 7001: T1 · coronal · left · 2.5mm · 0.52mm/px · 4 of 48 slices shown (3 of 5)]
[im 1/48]
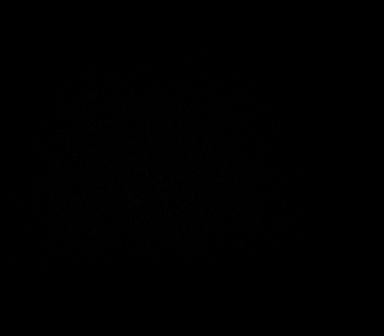
[im 16/48]
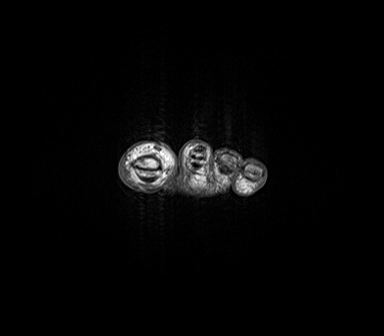
[im 32/48]
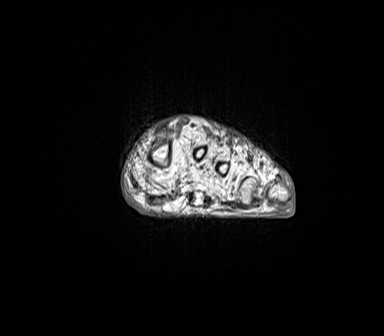
[im 48/48]
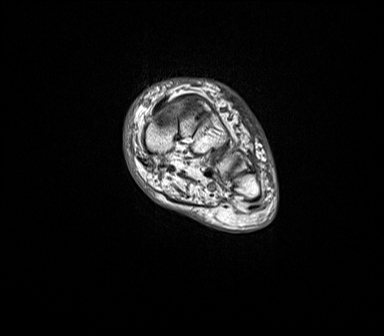

[Series 8001: T2 · coronal · left · 2.5mm · 0.62mm/px · 4 of 48 slices shown (1 of 2)]
[im 1/48]
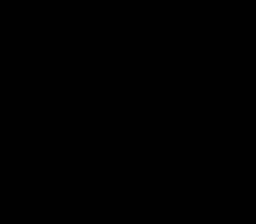
[im 16/48]
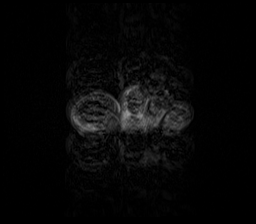
[im 32/48]
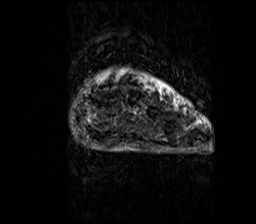
[im 48/48]
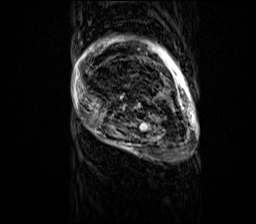

[Series 9001: T2 · coronal · left · 2.5mm · 0.62mm/px · 4 of 46 slices shown (2 of 2)]
[im 1/46]
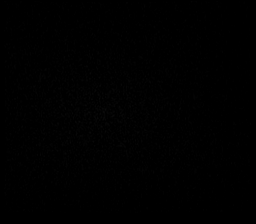
[im 16/46]
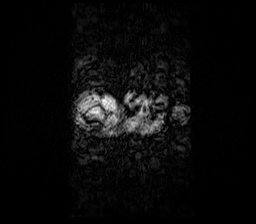
[im 31/46]
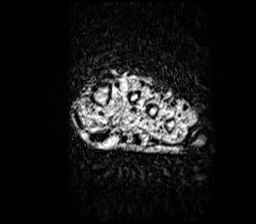
[im 46/46]
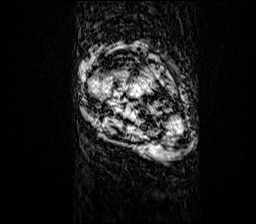

[T1 · axial · left · 3.0mm · 0.62mm/px · z∈[-90,+2]mm · 2 of 30 slices shown (4 of 5)]
[im 1/30]
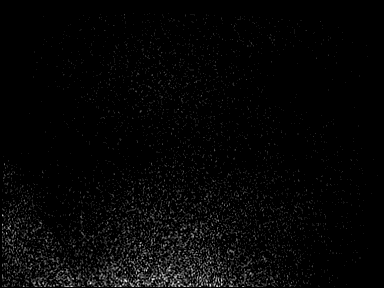
[im 30/30]
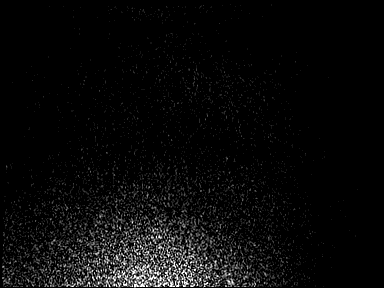

[[id number] · coronal · left · 2.5mm · 0.39mm/px · 4 of 48 slices shown]
[im 1/48]
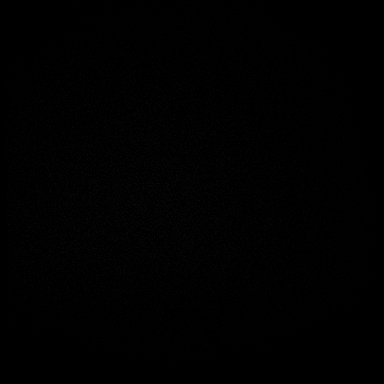
[im 16/48]
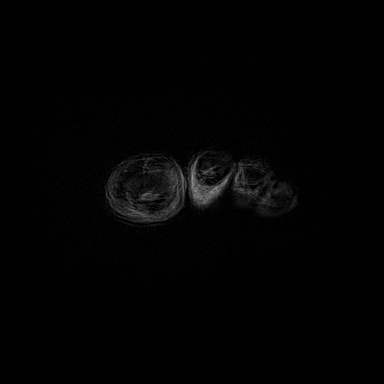
[im 32/48]
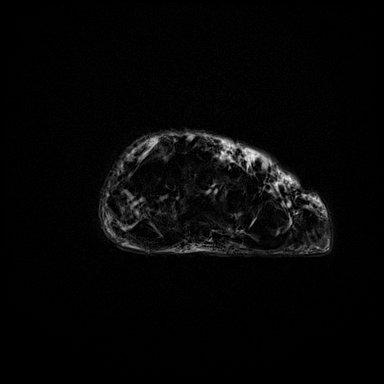
[im 48/48]
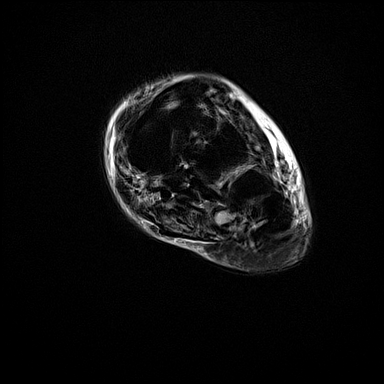

[STIR · axial · left · 3.0mm · 0.78mm/px · z∈[-85,+6]mm · 2 of 29 slices shown (1 of 2)]
[im 1/29]
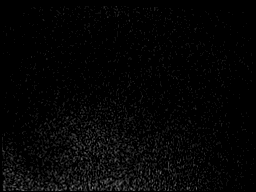
[im 29/29]
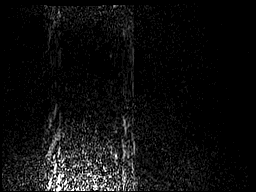

[T1 · axial · left · 3.0mm · 0.52mm/px · z∈[-88,+3]mm · 2 of 30 slices shown (5 of 5)]
[im 1/30]
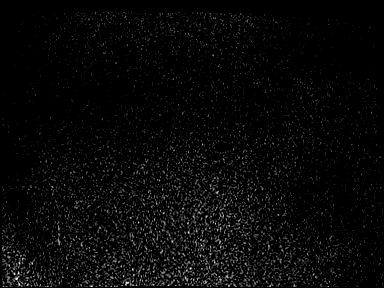
[im 30/30]
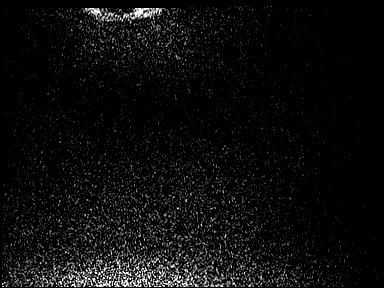

[STIR · sagittal · left · 2.0mm · 0.70mm/px · 4 of 45 slices shown (2 of 2)]
[im 1/45]
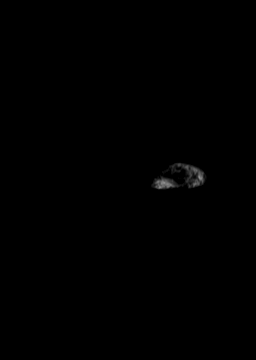
[im 15/45]
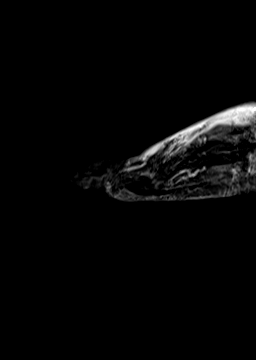
[im 30/45]
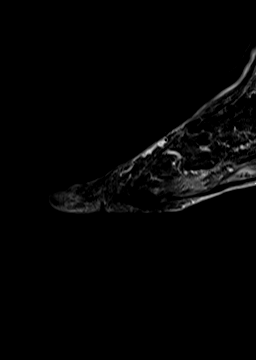
[im 45/45]
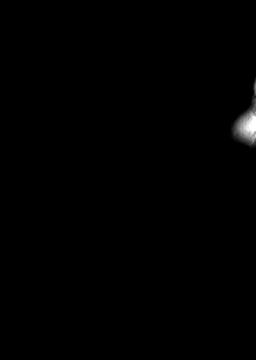

[37 of 40 positions shown; findings below may reference images not displayed]

FINDINGS: Examination is quite limited by patient motion.

Subcutaneous soft tissue swelling/edema suggesting cellulitis. No
findings for myofasciitis or pyomyositis. No discrete drainable
fluid collection is identified.

No definite findings for septic arthritis or osteomyelitis.
IMPRESSION: Very limited examination but no definite MR findings to suggest
septic arthritis or osteomyelitis.

Cellulitis without discrete drainable soft tissue abscess,
myofasciitis or pyomyositis.

## 2019-04-14 ENCOUNTER — Ambulatory Visit (INDEPENDENT_AMBULATORY_CARE_PROVIDER_SITE_OTHER): Payer: PPO

## 2019-04-14 DIAGNOSIS — Z23 Encounter for immunization: Secondary | ICD-10-CM

## 2019-04-14 DIAGNOSIS — T148XXA Other injury of unspecified body region, initial encounter: Secondary | ICD-10-CM

## 2019-04-14 DIAGNOSIS — S80811A Abrasion, right lower leg, initial encounter: Secondary | ICD-10-CM | POA: Diagnosis not present

## 2019-04-16 NOTE — Progress Notes (Signed)
MRN : [id number]  Eric [name]. is a 85 y.o. ([birth year]) male who presents with chief complaint of  Chief Complaint  Patient presents with  . Follow-up  .  History of Present Illness:   The patient is seen for evaluation of painful lower extremities and diminished pulses associated with ulceration of the foot.  The patient notes the ulcer has been present for multiple weeks and has not been improving.  It is very painful and has had some drainage.  No specific history of trauma noted by the patient.  The patient denies fever or chills.  the patient does have diabetes which has been difficult to control.  Since the last visit the wound has been improved.  The patient denies rest pain or dangling of an extremity off the side of the bed during the night for relief. No prior interventions or surgeries.  No history of back problems or DJD of the lumbar sacral spine.    Current Meds  Medication Sig  . acetaminophen (TYLENOL) 325 MG tablet Take 2 tablets (650 mg total) by mouth every 6 (six) hours as needed for mild pain (or temp >/= 101 F).  [name] amLODipine (NORVASC) 10 MG tablet Take 1 tablet by mouth once daily  . apixaban (ELIQUIS) 5 MG TABS tablet Take 1 tablet (5 mg total) by mouth 2 (two) times daily.  . B Complex Vitamins (B COMPLEX-B12) TABS Take 1 tablet by mouth daily.   . cholecalciferol (VITAMIN D) 1000 UNITS tablet Take 1,000 Units by mouth 2 (two) times a week. Takes on Mon and Fri  . Cinnamon 500 MG capsule Take 1,000 mg by mouth 2 (two) times daily.   . ferrous sulfate 325 (65 FE) MG EC tablet Take 325 mg by mouth 3 (three) times daily with meals.  . finasteride (PROSCAR) 5 MG tablet TAKE 1 TABLET BY MOUTH ONCE DAILY  . fish oil-omega-3 fatty acids 1000 MG capsule Take 1,000 mg by mouth daily.   . hydrALAZINE (APRESOLINE) 25 MG tablet Take 3 tablets (75 mg total) by mouth every 8 (eight) hours.  [name] lisinopril (PRINIVIL,ZESTRIL) 20 MG tablet Take 1 tablet (20 mg total)  by mouth daily.  . Multiple Vitamin (DAILY MULTIVITAMIN PO) Take 1 tablet by mouth daily.    . pentoxifylline (TRENTAL) 400 MG CR tablet TAKE 1 TABLET BY MOUTH THREE TIMES DAILY WITH MEALS (Patient taking differently: Per patient he is only taking it every 12 hrs.)  . rosuvastatin (CRESTOR) 40 MG tablet Take 1 tablet by mouth once daily  . saccharomyces boulardii (FLORASTOR) 250 MG capsule Take 1 capsule (250 mg total) by mouth daily.  . tamsulosin (FLOMAX) 0.4 MG CAPS capsule Take 1 capsule by mouth once daily    Past Medical History:  Diagnosis Date  . Atrial flutter ([place])    s/p RFCA  . CAD (coronary artery disease)    cath 2003, occluded S-RCA, occluded S-Dx, L-LAD ok, s/p PTCA to LAD  . Cataract   . CKD (chronic kidney disease) stage 3, GFR 30-59 ml/min (HCC)   . Diabetes mellitus    diet controlled  . Hearing aid worn    bilateral  . History of kidney stones   . Long term (current) use of anticoagulants   . Neuromuscular disorder ([place])   . OSA (obstructive sleep apnea) 12/11   very mild, AHI 7/hr  . Persistent atrial fibrillation 09/26/2015  . Pure hyperglyceridemia   . PVD (peripheral vascular disease) ([place])  angioplasty of his right lower extremity in [place] by Dr.[name] 2013  . Unspecified essential hypertension   . Wears dentures    full upper and lower    Past Surgical History:  Procedure Laterality Date  . ABDOMINAL AORTOGRAM W/LOWER EXTREMITY N/A 11/15/2017   Procedure: ABDOMINAL AORTOGRAM W/LOWER EXTREMITY;  Surgeon: [name], MD;  Location: [facility];  Service: Cardiovascular;  Laterality: N/A;  . Adenosine Myoview  3/06   EF 56%, neg. Ischemia  . Adenosine Myoview  02/18/07   nml  . AMPUTATION Left 12/15/2017   Procedure: AMPUTATION BELOW KNEE;  Surgeon: Algernon [name], MD;  Location: [facility];  Service: Vascular;  Laterality: Left;  . ANGIOPLASTY  1/99   CAD- diogonal with rotational artherectomy  . Arthrectomy     of LAD & PTCA  .  BLEPHAROPLASTY Bilateral   . CARDIAC CATHETERIZATION  1/00  . CARDIOVERSION  1/04  . CARDIOVERSION  5/07   hospital- a flutter  . CARPAL TUNNEL RELEASE     ? bilateral  . CATARACT EXTRACTION W/PHACO Right 07/28/2017   Procedure: CATARACT EXTRACTION PHACO AND INTRAOCULAR LENS PLACEMENT ([place]) RIGHT DIABETIC;  Surgeon: [name], MD;  Location: [facility];  Service: Ophthalmology;  Laterality: Right;  Diabetic - diet controlled  . CATARACT EXTRACTION W/PHACO Left 08/18/2017   Procedure: CATARACT EXTRACTION PHACO AND INTRAOCULAR LENS PLACEMENT ([facility]);  Surgeon: [name], MD;  Location: [facility];  Service: Ophthalmology;  Laterality: Left;  DIABETES - oral meds  . COLONOSCOPY W/ BIOPSIES  10/01/06   sigmoid polyp bx neg, 3 years  . CORONARY ANGIOPLASTY  4/03   cutting balloon PTCA pLAD into Diag  . CORONARY ARTERY BYPASS GRAFT  2000   LIMA-LAD, SVG-RCA, SVG-Diag; SVG-Diag & SVG-RCA occluded 2003  . FRACTURE SURGERY    . HAND SURGERY  08/27/09   R thumb procedure wit Scaphoid Gragt and screws, Dr [name]  . LOWER EXTREMITY ANGIOGRAPHY Right 01/25/2019   Procedure: LOWER EXTREMITY ANGIOGRAPHY;  Surgeon: Katha Cabal, MD;  Location: [facility];  Service: Cardiovascular;  Laterality: Right;  . NM MYOVIEW LTD  4/11   normal  . PERIPHERAL VASCULAR CATHETERIZATION Right 06/27/2015   Procedure: Lower Extremity Angiography;  Surgeon: Algernon [name], MD;  Location: [facility];  Service: Cardiovascular;  Laterality: Right;  . PERIPHERAL VASCULAR CATHETERIZATION  06/27/2015   Procedure: Lower Extremity Intervention;  Surgeon: Algernon [name], MD;  Location: [facility];  Service: Cardiovascular;;    Social History Social History   Tobacco Use  . Smoking status: Never Smoker  . Smokeless tobacco: Former Network engineer Use Topics  . Alcohol use: No  . Drug use: No    Family History Family History  Problem Relation Age of Onset   . Stroke Father   . Aneurysm Father   . Hypertension Father   . Prostate cancer Brother   . Hypertension Mother   . Lung cancer Brother        smoker  . Thrombosis Brother   . Other Brother        RF valve disorder (smoker)  . Lung cancer Brother        smoker  . Breast cancer Sister   . Liver cancer Brother   . Other Sister        cerebral hemorrhage    Allergies  Allergen Reactions  . Isosorbide Mononitrate Other (See Comments)    Unknown, doesn't remember   .  Ramipril Cough and Other (See Comments)  . Quinidine Gluconate Rash  . Quinidine Gluconate Other (See Comments) and Rash     REVIEW OF SYSTEMS (Negative unless checked)  Constitutional: [] Weight loss  [] Fever  [] Chills Cardiac: [] Chest pain   [] Chest pressure   [] Palpitations   [] Shortness of breath when laying flat   [] Shortness of breath with exertion. Vascular:  [] Pain in legs with walking   [] Pain in legs at rest  [] History of DVT   [] Phlebitis   [] Swelling in legs   [] Varicose veins   [] Non-healing ulcers Pulmonary:   [] Uses home oxygen   [] Productive cough   [] Hemoptysis   [] Wheeze  [] COPD   [] Asthma Neurologic:  [] Dizziness   [] Seizures   [] History of stroke   [] History of TIA  [] Aphasia   [] Vissual changes   [] Weakness or numbness in arm   [] Weakness or numbness in leg Musculoskeletal:   [] Joint swelling   [] Joint pain   [] Low back pain Hematologic:  [] Easy bruising  [] Easy bleeding   [] Hypercoagulable state   [] Anemic Gastrointestinal:  [] Diarrhea   [] Vomiting  [] Gastroesophageal reflux/heartburn   [] Difficulty swallowing. Genitourinary:  [] Chronic kidney disease   [] Difficult urination  [] Frequent urination   [] Blood in urine Skin:  [] Rashes   [] Ulcers  Psychological:  [] History of anxiety   []  History of major depression.  Physical Examination  Vitals:   04/13/19 0950  BP: (!) 161/89  Pulse: 88  Resp: 14  Weight: 165 lb (74.8 kg)  Height: 5\' 5"  (1.651 m)   Body mass index is 27.46 kg/m. Gen:  WD/WN, NAD Head: [place]/AT, No temporalis wasting.  Ear/Nose/Throat: Hearing grossly intact, nares w/o erythema or drainage Eyes: PER, EOMI, sclera nonicteric.  Neck: Supple, no large masses.   Pulmonary:  Good air movement, no audible wheezing bilaterally, no use of accessory muscles.  Cardiac: RRR, no JVD Vascular: scattered varicosities present bilaterally.  Mild venous stasis changes to the legs bilaterally.  2+ soft pitting edema.   Vessel Right Left  Radial Palpable Palpable  PT Not Palpable Not Palpable  DP Not Palpable Not Palpable  Gastrointestinal: Non-distended. No guarding/no peritoneal signs.  Musculoskeletal: M/S 5/5 throughout.  No deformity or atrophy.  Neurologic: CN 2-12 intact. Symmetrical.  Speech is fluent. Motor exam as listed above. Psychiatric: Judgment intact, Mood & affect appropriate for pt's clinical situation. Dermatologic: venous rashes foot ulcer noted no infection.  No changes consistent with cellulitis. Lymph : No lichenification or skin changes of chronic lymphedema.  CBC Lab Results  Component Value Date   WBC 9.5 03/09/2019   HGB 12.0 (L) 03/09/2019   HCT 38.5 (L) 03/09/2019   MCV 97.5 03/09/2019   PLT 135 (L) 03/09/2019    BMET    Component Value Date/Time   NA 141 03/09/2019 0427   NA 139 04/28/2012 0658   K 4.0 03/09/2019 0427   K 4.2 04/28/2012 0658   CL 108 03/09/2019 0427   CL 106 04/28/2012 0658   CO2 24 03/09/2019 0427   CO2 25 04/28/2012 0658   GLUCOSE 96 03/09/2019 0427   GLUCOSE 114 (H) 04/28/2012 0658   BUN 18 03/09/2019 0427   BUN 32 (H) 04/28/2012 0658   CREATININE 1.03 03/09/2019 0427   CREATININE 1.45 (H) 10/20/2018 0855   CALCIUM 9.1 03/09/2019 0427   CALCIUM 8.9 04/28/2012 0658   GFRNONAA >60 03/09/2019 0427   GFRNONAA 44 (L) 01/26/2018 1221   GFRAA >60 03/09/2019 0427   GFRAA 52 (L) 01/26/2018 1221  CrCl cannot be calculated (Patient's most recent lab result is older than the maximum 21 days allowed.).  COAG  Lab Results  Component Value Date   INR 1.11 12/15/2017   INR 1.46 12/13/2017   INR 1.27 11/15/2017   PROTIME 19.9 01/10/2009    Radiology No results found.   Assessment/Plan 1. Atherosclerosis of native arteries of the extremities with ulceration ([place]) Recommend:  The patient has evidence of atherosclerosis of the lower extremities with claudication.  The patient does not voice lifestyle limiting changes at this point in time.  Noninvasive studies do not suggest clinically significant change.  No invasive studies, angiography or surgery at this time The patient should continue walking and begin a more formal exercise program.  The patient should continue antiplatelet therapy and aggressive treatment of the lipid abnormalities  No changes in the patient's medications at this time  The patient should continue wearing graduated compression socks 10-15 mmHg strength to control the mild edema.   2. Chronic coronary artery disease Continue cardiac and antihypertensive medications as already ordered and reviewed, no changes at this time.  Continue statin as ordered and reviewed, no changes at this time  Nitrates PRN for chest pain   3. Atrial flutter, unspecified type ([place]) Continue antiarrhythmia medications as already ordered, these medications have been reviewed and there are no changes at this time.  Continue anticoagulation as ordered by Cardiology Service   4. Essential hypertension Continue antihypertensive medications as already ordered, these medications have been reviewed and there are no changes at this time.    Hortencia Pilar, MD  04/16/2019 1:45 PM

## 2019-04-18 NOTE — Progress Notes (Signed)
Patient came in with abrasion to his right  Lower shin. Patient states he cut his leg on a piece of tin at home. TDAP was given in the right deltoid. Patient tolerated well

## 2019-05-02 ENCOUNTER — Other Ambulatory Visit: Payer: Self-pay | Admitting: Family Medicine

## 2019-05-02 DIAGNOSIS — N183 Chronic kidney disease, stage 3 unspecified: Secondary | ICD-10-CM

## 2019-05-02 DIAGNOSIS — E781 Pure hyperglyceridemia: Secondary | ICD-10-CM

## 2019-05-02 DIAGNOSIS — Z79899 Other long term (current) drug therapy: Secondary | ICD-10-CM

## 2019-05-02 DIAGNOSIS — I10 Essential (primary) hypertension: Secondary | ICD-10-CM

## 2019-05-02 DIAGNOSIS — I739 Peripheral vascular disease, unspecified: Secondary | ICD-10-CM

## 2019-05-02 DIAGNOSIS — E1151 Type 2 diabetes mellitus with diabetic peripheral angiopathy without gangrene: Secondary | ICD-10-CM

## 2019-05-03 ENCOUNTER — Ambulatory Visit (INDEPENDENT_AMBULATORY_CARE_PROVIDER_SITE_OTHER): Payer: PPO | Admitting: Family Medicine

## 2019-05-03 ENCOUNTER — Other Ambulatory Visit: Payer: PPO

## 2019-05-03 ENCOUNTER — Other Ambulatory Visit: Payer: Self-pay

## 2019-05-03 DIAGNOSIS — I10 Essential (primary) hypertension: Secondary | ICD-10-CM

## 2019-05-03 DIAGNOSIS — E781 Pure hyperglyceridemia: Secondary | ICD-10-CM | POA: Diagnosis not present

## 2019-05-03 DIAGNOSIS — E1151 Type 2 diabetes mellitus with diabetic peripheral angiopathy without gangrene: Secondary | ICD-10-CM

## 2019-05-03 DIAGNOSIS — I739 Peripheral vascular disease, unspecified: Secondary | ICD-10-CM

## 2019-05-03 DIAGNOSIS — Z23 Encounter for immunization: Secondary | ICD-10-CM

## 2019-05-03 DIAGNOSIS — Z79899 Other long term (current) drug therapy: Secondary | ICD-10-CM

## 2019-05-03 DIAGNOSIS — N183 Chronic kidney disease, stage 3 unspecified: Secondary | ICD-10-CM

## 2019-05-04 LAB — CBC WITH DIFFERENTIAL/PLATELET
Absolute Monocytes: 894 cells/uL (ref 200–950)
Basophils Absolute: 98 cells/uL (ref 0–200)
Basophils Relative: 0.9 %
Eosinophils Absolute: 305 cells/uL (ref 15–500)
Eosinophils Relative: 2.8 %
HCT: 34.6 % — ABNORMAL LOW (ref 38.5–50.0)
Hemoglobin: 11.5 g/dL — ABNORMAL LOW (ref 13.2–17.1)
Lymphs Abs: 3750 cells/uL (ref 850–3900)
MCH: 30.8 pg (ref 27.0–33.0)
MCHC: 33.2 g/dL (ref 32.0–36.0)
MCV: 92.8 fL (ref 80.0–100.0)
MPV: 10.5 fL (ref 7.5–12.5)
Monocytes Relative: 8.2 %
Neutro Abs: 5853 cells/uL (ref 1500–7800)
Neutrophils Relative %: 53.7 %
Platelets: 201 10*3/uL (ref 140–400)
RBC: 3.73 10*6/uL — ABNORMAL LOW (ref 4.20–5.80)
RDW: 13.1 % (ref 11.0–15.0)
Total Lymphocyte: 34.4 %
WBC: 10.9 10*3/uL — ABNORMAL HIGH (ref 3.8–10.8)

## 2019-05-04 LAB — COMPREHENSIVE METABOLIC PANEL
AG Ratio: 1.2 (calc) (ref 1.0–2.5)
ALT: 19 U/L (ref 9–46)
AST: 27 U/L (ref 10–35)
Albumin: 3.6 g/dL (ref 3.6–5.1)
Alkaline phosphatase (APISO): 60 U/L (ref 35–144)
BUN/Creatinine Ratio: 16 (calc) (ref 6–22)
BUN: 19 mg/dL (ref 7–25)
CO2: 22 mmol/L (ref 20–32)
Calcium: 9.5 mg/dL (ref 8.6–10.3)
Chloride: 109 mmol/L (ref 98–110)
Creat: 1.17 mg/dL — ABNORMAL HIGH (ref 0.70–1.11)
Globulin: 2.9 g/dL (calc) (ref 1.9–3.7)
Glucose, Bld: 105 mg/dL — ABNORMAL HIGH (ref 65–99)
Potassium: 4.8 mmol/L (ref 3.5–5.3)
Sodium: 139 mmol/L (ref 135–146)
Total Bilirubin: 0.5 mg/dL (ref 0.2–1.2)
Total Protein: 6.5 g/dL (ref 6.1–8.1)

## 2019-05-04 LAB — LIPID PANEL
Cholesterol: 98 mg/dL (ref <200)
HDL: 33 mg/dL — ABNORMAL LOW (ref >=40)
LDL Cholesterol (Calc): 44 mg/dL (calc)
Non-HDL Cholesterol (Calc): 65 mg/dL (calc) (ref <130)
Total CHOL/HDL Ratio: 3 (calc) (ref <5.0)
Triglycerides: 120 mg/dL (ref <150)

## 2019-05-04 LAB — HEMOGLOBIN A1C
Hgb A1c MFr Bld: 5.3 % of total Hgb (ref <5.7)
Mean Plasma Glucose: 105 (calc)
eAG (mmol/L): 5.8 (calc)

## 2019-05-11 ENCOUNTER — Telehealth (INDEPENDENT_AMBULATORY_CARE_PROVIDER_SITE_OTHER): Payer: Self-pay | Admitting: Vascular Surgery

## 2019-05-15 ENCOUNTER — Encounter (INDEPENDENT_AMBULATORY_CARE_PROVIDER_SITE_OTHER): Payer: Self-pay | Admitting: Vascular Surgery

## 2019-05-15 ENCOUNTER — Other Ambulatory Visit: Payer: Self-pay

## 2019-05-15 ENCOUNTER — Ambulatory Visit (INDEPENDENT_AMBULATORY_CARE_PROVIDER_SITE_OTHER): Payer: PPO | Admitting: Vascular Surgery

## 2019-05-15 VITALS — BP 173/66 | HR 80 | Resp 12 | Ht 65.0 in | Wt 167.0 lb

## 2019-05-15 DIAGNOSIS — I10 Essential (primary) hypertension: Secondary | ICD-10-CM | POA: Diagnosis not present

## 2019-05-15 DIAGNOSIS — I2510 Atherosclerotic heart disease of native coronary artery without angina pectoris: Secondary | ICD-10-CM

## 2019-05-15 DIAGNOSIS — E781 Pure hyperglyceridemia: Secondary | ICD-10-CM

## 2019-05-15 DIAGNOSIS — I7025 Atherosclerosis of native arteries of other extremities with ulceration: Secondary | ICD-10-CM

## 2019-05-15 DIAGNOSIS — Z89512 Acquired absence of left leg below knee: Secondary | ICD-10-CM

## 2019-05-15 DIAGNOSIS — E1152 Type 2 diabetes mellitus with diabetic peripheral angiopathy with gangrene: Secondary | ICD-10-CM

## 2019-05-15 NOTE — Progress Notes (Signed)
MRN : [id number]  Eric [name]. is a 85 y.o. (1928-06-27) male who presents with chief complaint of  Chief Complaint  Patient presents with   Follow-up  .  History of Present Illness:   Atherosclerotic occlusive disease bilateral lower extremities with ulcerations of the right foot; he is status post left below-knee amputation.  The patient is here for followup. There have been no interval changes in lower extremity symptoms. No interval shortening of the patient's claudication distance or development of rest pain symptoms. No new ulcers or wounds have occurred since the last visit.  There have been no significant changes to the patient's overall health care.  The patient denies amaurosis fugax or recent TIA symptoms. There are no recent neurological changes noted. The patient denies history of DVT, PE or superficial thrombophlebitis. The patient denies recent episodes of angina or shortness of breath.     Current Meds  Medication Sig   acetaminophen (TYLENOL) 325 MG tablet Take 2 tablets (650 mg total) by mouth every 6 (six) hours as needed for mild pain (or temp >/= 101 F).   amLODipine (NORVASC) 10 MG tablet Take 1 tablet by mouth once daily   apixaban (ELIQUIS) 5 MG TABS tablet Take 1 tablet (5 mg total) by mouth 2 (two) times daily.   B Complex Vitamins (B COMPLEX-B12) TABS Take 1 tablet by mouth daily.    cholecalciferol (VITAMIN D) 1000 UNITS tablet Take 1,000 Units by mouth 2 (two) times a week. Takes on Mon and Fri   Cinnamon 500 MG capsule Take 1,000 mg by mouth 2 (two) times daily.    ferrous sulfate 325 (65 FE) MG EC tablet Take 325 mg by mouth 3 (three) times daily with meals.   finasteride (PROSCAR) 5 MG tablet TAKE 1 TABLET BY MOUTH ONCE DAILY   fish oil-omega-3 fatty acids 1000 MG capsule Take 1,000 mg by mouth daily.    hydrALAZINE (APRESOLINE) 25 MG tablet Take 3 tablets (75 mg total) by mouth every 8 (eight) hours.   lisinopril  (PRINIVIL,ZESTRIL) 20 MG tablet Take 1 tablet (20 mg total) by mouth daily.   Multiple Vitamin (DAILY MULTIVITAMIN PO) Take 1 tablet by mouth daily.     ONETOUCH ULTRA test strip USE 1 STRIP TO CHECK GLUCOSE ONCE DAILY   pentoxifylline (TRENTAL) 400 MG CR tablet TAKE 1 TABLET BY MOUTH THREE TIMES DAILY WITH MEALS (Patient taking differently: Per patient he is only taking it every 12 hrs.)   rosuvastatin (CRESTOR) 40 MG tablet Take 1 tablet by mouth once daily   saccharomyces boulardii (FLORASTOR) 250 MG capsule Take 1 capsule (250 mg total) by mouth daily.   tamsulosin (FLOMAX) 0.4 MG CAPS capsule Take 1 capsule by mouth once daily    Past Medical History:  Diagnosis Date   Atrial flutter (HCC)    s/p RFCA   CAD (coronary artery disease)    cath 2003, occluded S-RCA, occluded S-Dx, L-LAD ok, s/p PTCA to LAD   Cataract    CKD (chronic kidney disease) stage 3, GFR 30-59 ml/min    Diabetes mellitus    diet controlled   Hearing aid worn    bilateral   History of kidney stones    Long term (current) use of anticoagulants    Neuromuscular disorder (HCC)    OSA (obstructive sleep apnea) 12/11   very mild, AHI 7/hr   Persistent atrial fibrillation ([place]) 09/26/2015   Pure hyperglyceridemia    PVD (peripheral vascular disease) ([place])  angioplasty of his right lower extremity in [place] by Dr.[name] 2013   Unspecified essential hypertension    Wears dentures    full upper and lower    Past Surgical History:  Procedure Laterality Date   ABDOMINAL AORTOGRAM W/LOWER EXTREMITY N/A 11/15/2017   Procedure: ABDOMINAL AORTOGRAM W/LOWER EXTREMITY;  Surgeon: [name], MD;  Location: [facility];  Service: Cardiovascular;  Laterality: N/A;   Adenosine Myoview  3/06   EF 56%, neg. Ischemia   Adenosine Myoview  02/18/07   nml   AMPUTATION Left 12/15/2017   Procedure: AMPUTATION BELOW KNEE;  Surgeon: Algernon [name], MD;  Location: [facility];  Service: Vascular;   Laterality: Left;   ANGIOPLASTY  1/99   CAD- diogonal with rotational artherectomy   Arthrectomy     of LAD & PTCA   BLEPHAROPLASTY Bilateral    CARDIAC CATHETERIZATION  1/00   CARDIOVERSION  1/04   CARDIOVERSION  5/07   hospital- a flutter   CARPAL TUNNEL RELEASE     ? bilateral   CATARACT EXTRACTION W/PHACO Right 07/28/2017   Procedure: CATARACT EXTRACTION PHACO AND INTRAOCULAR LENS PLACEMENT ([place]) RIGHT DIABETIC;  Surgeon: [name], MD;  Location: [facility];  Service: Ophthalmology;  Laterality: Right;  Diabetic - diet controlled   CATARACT EXTRACTION W/PHACO Left 08/18/2017   Procedure: CATARACT EXTRACTION PHACO AND INTRAOCULAR LENS PLACEMENT ([facility]);  Surgeon: [name], MD;  Location: [facility];  Service: Ophthalmology;  Laterality: Left;  DIABETES - oral meds   COLONOSCOPY W/ BIOPSIES  10/01/06   sigmoid polyp bx neg, 3 years   CORONARY ANGIOPLASTY  4/03   cutting balloon PTCA pLAD into Diag   CORONARY ARTERY BYPASS GRAFT  2000   LIMA-LAD, SVG-RCA, SVG-Diag; SVG-Diag & SVG-RCA occluded 2003   FRACTURE SURGERY     HAND SURGERY  08/27/09   R thumb procedure wit Scaphoid Gragt and screws, Dr [name]   LOWER EXTREMITY ANGIOGRAPHY Right 01/25/2019   Procedure: LOWER EXTREMITY ANGIOGRAPHY;  Surgeon: Katha Cabal, MD;  Location: [facility];  Service: Cardiovascular;  Laterality: Right;   NM MYOVIEW LTD  4/11   normal   PERIPHERAL VASCULAR CATHETERIZATION Right 06/27/2015   Procedure: Lower Extremity Angiography;  Surgeon: Algernon [name], MD;  Location: [facility];  Service: Cardiovascular;  Laterality: Right;   PERIPHERAL VASCULAR CATHETERIZATION  06/27/2015   Procedure: Lower Extremity Intervention;  Surgeon: Algernon [name], MD;  Location: [facility];  Service: Cardiovascular;;    Social History Social History   Tobacco Use   Smoking status: Never Smoker   Smokeless tobacco: Former Dance movement psychotherapist Use Topics   Alcohol use: No   Drug use: No    Family History Family History  Problem Relation Age of Onset   Stroke Father    Aneurysm Father    Hypertension Father    Prostate cancer Brother    Hypertension Mother    Lung cancer Brother        smoker   Thrombosis Brother    Other Brother        RF valve disorder (smoker)   Lung cancer Brother        smoker   Breast cancer Sister    Liver cancer Brother    Other Sister        cerebral hemorrhage    Allergies  Allergen Reactions   Isosorbide Mononitrate Other (See Comments)    Unknown, doesn't remember  Ramipril Cough and Other (See Comments)   Quinidine Gluconate Rash   Quinidine Gluconate Other (See Comments) and Rash     REVIEW OF SYSTEMS (Negative unless checked)  Constitutional: [] Weight loss  [] Fever  [] Chills Cardiac: [] Chest pain   [] Chest pressure   [] Palpitations   [] Shortness of breath when laying flat   [x] Shortness of breath with exertion. Vascular:  [x] Pain in leg with walking   [] Pain in legs at rest  [] History of DVT   [] Phlebitis   [] Swelling in legs   [] Varicose veins   [] Non-healing ulcers Pulmonary:   [] Uses home oxygen   [] Productive cough   [] Hemoptysis   [] Wheeze  [] COPD   [] Asthma Neurologic:  [] Dizziness   [] Seizures   [] History of stroke   [] History of TIA  [] Aphasia   [] Vissual changes   [] Weakness or numbness in arm   [] Weakness or numbness in leg Musculoskeletal:   [] Joint swelling   [] Joint pain   [] Low back pain Hematologic:  [] Easy bruising  [] Easy bleeding   [] Hypercoagulable state   [] Anemic Gastrointestinal:  [] Diarrhea   [] Vomiting  [] Gastroesophageal reflux/heartburn   [] Difficulty swallowing. Genitourinary:  [] Chronic kidney disease   [] Difficult urination  [] Frequent urination   [] Blood in urine Skin:  [] Rashes   [] Ulcers  Psychological:  [] History of anxiety   []  History of major depression.  Physical Examination  Vitals:   05/15/19 0930  BP:  (!) 173/66  Pulse: 80  Resp: 12  Weight: 167 lb (75.8 kg)  Height: 5\' 5"  (1.651 m)   Body mass index is 27.79 kg/m. Gen: WD/WN, NAD Head: [place]/AT, No temporalis wasting.  Ear/Nose/Throat: Hearing grossly intact, nares w/o erythema or drainage Eyes: PER, EOMI, sclera nonicteric.  Neck: Supple, no large masses.   Pulmonary:  Good air movement, no audible wheezing bilaterally, no use of accessory muscles.  Cardiac: RRR, no JVD Vascular:  Left BKA well healed Vessel Right Left  PT Not Palpable BKA  DP Not Palpable BKA  Gastrointestinal: Non-distended. No guarding/no peritoneal signs.  Musculoskeletal: M/S 5/5 throughout.  No deformity or atrophy.  Neurologic: CN 2-12 intact. Symmetrical.  Speech is fluent. Motor exam as listed above. Psychiatric: Judgment intact, Mood & affect appropriate for pt's clinical situation. Dermatologic: No rashes or ulcers noted.  No changes consistent with cellulitis. Lymph : No lichenification or skin changes of chronic lymphedema.  CBC Lab Results  Component Value Date   WBC 10.9 (H) 05/03/2019   HGB 11.5 (L) 05/03/2019   HCT 34.6 (L) 05/03/2019   MCV 92.8 05/03/2019   PLT 201 05/03/2019    BMET    Component Value Date/Time   NA 139 05/03/2019 0840   NA 139 04/28/2012 0658   K 4.8 05/03/2019 0840   K 4.2 04/28/2012 0658   CL 109 05/03/2019 0840   CL 106 04/28/2012 0658   CO2 22 05/03/2019 0840   CO2 25 04/28/2012 0658   GLUCOSE 105 (H) 05/03/2019 0840   GLUCOSE 114 (H) 04/28/2012 0658   BUN 19 05/03/2019 0840   BUN 32 (H) 04/28/2012 0658   CREATININE 1.17 (H) 05/03/2019 0840   CALCIUM 9.5 05/03/2019 0840   CALCIUM 8.9 04/28/2012 0658   GFRNONAA >60 03/09/2019 0427   GFRNONAA 44 (L) 01/26/2018 1221   GFRAA >60 03/09/2019 0427   GFRAA 52 (L) 01/26/2018 1221   Estimated Creatinine Clearance: 40.7 mL/min (A) (by C-G formula based on SCr of 1.17 mg/dL (H)).  COAG Lab Results  Component Value Date   INR  1.11 12/15/2017   INR 1.46  12/13/2017   INR 1.27 11/15/2017   PROTIME 19.9 01/10/2009    Radiology No results found.   Assessment/Plan 1. Atherosclerosis of native arteries of the extremities with ulceration ([place])  Recommend:  The patient has evidence of atherosclerosis of the lower extremities with claudication.  The patient does not voice lifestyle limiting changes at this point in time.  Noninvasive studies do not suggest clinically significant change.  No invasive studies, angiography or surgery at this time The patient should continue walking and begin a more formal exercise program.  The patient should continue antiplatelet therapy and aggressive treatment of the lipid abnormalities  No changes in the patient's medications at this time  The patient should continue wearing graduated compression socks 10-15 mmHg strength to control the mild edema.   - VAS [place] ABI WITH/WO TBI; Future  2. Hx of BKA, left ([place]) Will contact [facility] for evaluation  3. Essential hypertension Continue antihypertensive medications as already ordered, these medications have been reviewed and there are no changes at this time.   4. Chronic coronary artery disease Continue cardiac and antihypertensive medications as already ordered and reviewed, no changes at this time.  Continue statin as ordered and reviewed, no changes at this time  Nitrates PRN for chest pain   5. Type 2 diabetes mellitus with diabetic peripheral angiopathy with gangrene, unspecified whether long term insulin use (HCC) Continue hypoglycemic medications as already ordered, these medications have been reviewed and there are no changes at this time.  Hgb A1C to be monitored as already arranged by primary service   6. Hypertriglyceridemia Continue statin as ordered and reviewed, no changes at this time    Hortencia Pilar, MD  05/15/2019 9:37 AM

## 2019-06-07 ENCOUNTER — Encounter: Payer: Self-pay | Admitting: Family Medicine

## 2019-06-08 ENCOUNTER — Ambulatory Visit (INDEPENDENT_AMBULATORY_CARE_PROVIDER_SITE_OTHER): Payer: PPO | Admitting: Family Medicine

## 2019-06-08 ENCOUNTER — Other Ambulatory Visit: Payer: Self-pay

## 2019-06-08 VITALS — BP 132/74 | HR 90 | Temp 97.5°F | Resp 18 | Ht 65.0 in

## 2019-06-08 DIAGNOSIS — R82998 Other abnormal findings in urine: Secondary | ICD-10-CM

## 2019-06-08 LAB — URINALYSIS, ROUTINE W REFLEX MICROSCOPIC
Bilirubin Urine: NEGATIVE
Glucose, UA: NEGATIVE
Hyaline Cast: NONE SEEN /LPF
Ketones, ur: NEGATIVE
Nitrite: POSITIVE — AB
Specific Gravity, Urine: 1.02 (ref 1.001–1.03)
Squamous Epithelial / HPF: NONE SEEN /HPF (ref <=5)
WBC, UA: >=60 /HPF — AB (ref 0–5)
pH: 5.5 (ref 5.0–8.0)

## 2019-06-08 LAB — MICROSCOPIC MESSAGE

## 2019-06-08 MED ORDER — SULFAMETHOXAZOLE-TRIMETHOPRIM 800-160 MG PO TABS: 1.0000 | ORAL_TABLET | Freq: Two times a day (BID) | ORAL | 0 refills | Status: DC

## 2019-06-08 NOTE — Progress Notes (Signed)
Subjective:    Patient ID: Eric Hough., male    DOB: 1927-11-29, 85 y.o.   MRN: [id number]   Patient states that 1 week ago, he noticed his urine becoming dark.  It was never opaque however it was dark and at times red.  He denies any dysuria.  He denies any urgency.  He denies any frequency.  He denies any hesitancy.  He denies any low back pain.  He denies any fever.  He denies any nausea or vomiting.  He states that over the last 48 hours, his urine has lightened in color and is now looking "normal".  However urinalysis today shows dark urine with +3 blood, +2 protein, +3 leukocyte esterase, and positive nitrites. Past Medical History:  Diagnosis Date  . Atrial flutter ([place])    s/p RFCA  . CAD (coronary artery disease)    cath 2003, occluded S-RCA, occluded S-Dx, L-LAD ok, s/p PTCA to LAD  . Cataract   . CKD (chronic kidney disease) stage 3, GFR 30-59 ml/min   . Diabetes mellitus    diet controlled  . Hearing aid worn    bilateral  . History of kidney stones   . Long term (current) use of anticoagulants   . Neuromuscular disorder ([place])   . OSA (obstructive sleep apnea) 12/11   very mild, AHI 7/hr  . Persistent atrial fibrillation ([place]) 09/26/2015  . Pure hyperglyceridemia   . PVD (peripheral vascular disease) ([place])    angioplasty of his right lower extremity in [place] by Dr.[name] 2013  . Unspecified essential hypertension   . Wears dentures    full upper and lower   Past Surgical History:  Procedure Laterality Date  . ABDOMINAL AORTOGRAM W/LOWER EXTREMITY N/A 11/15/2017   Procedure: ABDOMINAL AORTOGRAM W/LOWER EXTREMITY;  Surgeon: [name], MD;  Location: [facility];  Service: Cardiovascular;  Laterality: N/A;  . Adenosine Myoview  3/06   EF 56%, neg. Ischemia  . Adenosine Myoview  02/18/07   nml  . AMPUTATION Left 12/15/2017   Procedure: AMPUTATION BELOW KNEE;  Surgeon: Algernon [name], MD;  Location: [facility];  Service: Vascular;  Laterality: Left;  .  ANGIOPLASTY  1/99   CAD- diogonal with rotational artherectomy  . Arthrectomy     of LAD & PTCA  . BLEPHAROPLASTY Bilateral   . CARDIAC CATHETERIZATION  1/00  . CARDIOVERSION  1/04  . CARDIOVERSION  5/07   hospital- a flutter  . CARPAL TUNNEL RELEASE     ? bilateral  . CATARACT EXTRACTION W/PHACO Right 07/28/2017   Procedure: CATARACT EXTRACTION PHACO AND INTRAOCULAR LENS PLACEMENT ([place]) RIGHT DIABETIC;  Surgeon: [name], MD;  Location: [place];  Service: Ophthalmology;  Laterality: Right;  Diabetic - diet controlled  . CATARACT EXTRACTION W/PHACO Left 08/18/2017   Procedure: CATARACT EXTRACTION PHACO AND INTRAOCULAR LENS PLACEMENT (IOC);  Surgeon: [name], MD;  Location: [place];  Service: Ophthalmology;  Laterality: Left;  DIABETES - oral meds  . COLONOSCOPY W/ BIOPSIES  10/01/06   sigmoid polyp bx neg, 3 years  . CORONARY ANGIOPLASTY  4/03   cutting balloon PTCA pLAD into Diag  . CORONARY ARTERY BYPASS GRAFT  2000   LIMA-LAD, SVG-RCA, SVG-Diag; SVG-Diag & SVG-RCA occluded 2003  . FRACTURE SURGERY    . HAND SURGERY  08/27/09   R thumb procedure wit Scaphoid Gragt and screws, Dr [name]  . LOWER EXTREMITY ANGIOGRAPHY Right 01/25/2019   Procedure: LOWER EXTREMITY ANGIOGRAPHY;  Surgeon:  Schnier, Dolores Lory, MD;  Location: [facility];  Service: Cardiovascular;  Laterality: Right;  . NM MYOVIEW LTD  4/11   normal  . PERIPHERAL VASCULAR CATHETERIZATION Right 06/27/2015   Procedure: Lower Extremity Angiography;  Surgeon: Algernon [name], MD;  Location: [facility];  Service: Cardiovascular;  Laterality: Right;  . PERIPHERAL VASCULAR CATHETERIZATION  06/27/2015   Procedure: Lower Extremity Intervention;  Surgeon: Algernon [name], MD;  Location: [facility];  Service: Cardiovascular;;   Current Outpatient Medications on File Prior to Visit  Medication Sig Dispense Refill  . acetaminophen (TYLENOL) 325 MG tablet Take 2 tablets  (650 mg total) by mouth every 6 (six) hours as needed for mild pain (or temp >/= 101 F).    [name] amLODipine (NORVASC) 10 MG tablet Take 1 tablet by mouth once daily 90 tablet 3  . apixaban (ELIQUIS) 5 MG TABS tablet Take 1 tablet (5 mg total) by mouth 2 (two) times daily. 60 tablet 1  . B Complex Vitamins (B COMPLEX-B12) TABS Take 1 tablet by mouth daily.     . cholecalciferol (VITAMIN D) 1000 UNITS tablet Take 1,000 Units by mouth 2 (two) times a week. Takes on Mon and Fri    . Cinnamon 500 MG capsule Take 1,000 mg by mouth 2 (two) times daily.     . ferrous sulfate 325 (65 FE) MG EC tablet Take 325 mg by mouth 3 (three) times daily with meals.    . finasteride (PROSCAR) 5 MG tablet TAKE 1 TABLET BY MOUTH ONCE DAILY 90 tablet 3  . fish oil-omega-3 fatty acids 1000 MG capsule Take 1,000 mg by mouth daily.     . hydrALAZINE (APRESOLINE) 25 MG tablet Take 3 tablets (75 mg total) by mouth every 8 (eight) hours. 270 tablet 3  . lisinopril (PRINIVIL,ZESTRIL) 20 MG tablet Take 1 tablet (20 mg total) by mouth daily. 30 tablet 0  . Multiple Vitamin (DAILY MULTIVITAMIN PO) Take 1 tablet by mouth daily.      [name] ULTRA test strip USE 1 STRIP TO CHECK GLUCOSE ONCE DAILY 50 each 5  . pentoxifylline (TRENTAL) 400 MG CR tablet TAKE 1 TABLET BY MOUTH THREE TIMES DAILY WITH MEALS (Patient taking differently: Per patient he is only taking it every 12 hrs.) 90 tablet 3  . rosuvastatin (CRESTOR) 40 MG tablet Take 1 tablet by mouth once daily 90 tablet 3  . saccharomyces boulardii (FLORASTOR) 250 MG capsule Take 1 capsule (250 mg total) by mouth daily. 90 capsule 3  . tamsulosin (FLOMAX) 0.4 MG CAPS capsule Take 1 capsule by mouth once daily 90 capsule 3   No current facility-administered medications on file prior to visit.    Allergies  Allergen Reactions  . Isosorbide Mononitrate Other (See Comments)    Unknown, doesn't remember   . Ramipril Cough and Other (See Comments)  . Quinidine Gluconate Rash  .  Quinidine Gluconate Other (See Comments) and Rash   Social History   Socioeconomic History  . Marital status: Married    Spouse name: Not on file  . Number of children: 5  . Years of education: Not on file  . Highest education level: Not on file  Occupational History  . Occupation: [employer]    Employer: RETIRED  Social Needs  . Financial resource strain: Not on file  . Food insecurity    Worry: Not on file    Inability: Not on file  . Transportation needs  Medical: Not on file    Non-medical: Not on file  Tobacco Use  . Smoking status: Never Smoker  . Smokeless tobacco: Former Network engineer and Sexual Activity  . Alcohol use: No  . Drug use: No  . Sexual activity: Never  Lifestyle  . Physical activity    Days per week: Not on file    Minutes per session: Not on file  . Stress: Not on file  Relationships  . Social Herbalist on phone: Not on file    Gets together: Not on file    Attends religious service: Not on file    Active member of club or organization: Not on file    Attends meetings of clubs or organizations: Not on file    Relationship status: Not on file  . Intimate partner violence    Fear of current or ex partner: Not on file    Emotionally abused: Not on file    Physically abused: Not on file    Forced sexual activity: Not on file  Other Topics Concern  . Not on file  Social History Narrative   Retired now does some antiques   Retired from Theatre manager and dye work   Married 1951   5 kids     Review of Systems  All other systems reviewed and are negative.      Objective:   Physical Exam Vitals signs reviewed.  Constitutional:      General: He is not in acute distress.    Appearance: He is well-developed. He is not diaphoretic.  Eyes:     General: No scleral icterus.       Right eye: No discharge.        Left eye: No discharge.     Conjunctiva/sclera: Conjunctivae normal.  Neck:     Musculoskeletal: Neck supple.      Vascular: No JVD.  Cardiovascular:     Rate and Rhythm: Normal rate. Rhythm irregular.     Heart sounds: Normal heart sounds.  Pulmonary:     Effort: Pulmonary effort is normal. No tachypnea, bradypnea, prolonged expiration or respiratory distress.     Breath sounds: Normal breath sounds. No stridor or decreased air movement. No wheezing, rhonchi or rales.  Abdominal:     General: Bowel sounds are normal. There is no distension.     Palpations: Abdomen is soft.     Tenderness: There is no abdominal tenderness. There is no guarding.  Musculoskeletal:        General: Deformity present.  Lymphadenopathy:     Cervical: No cervical adenopathy.   Patient has a history of a left below knee amputation      Assessment & Plan:  Dark brown-colored urine - Plan: Urinalysis, Routine w reflex microscopic, Urine Culture  Patient appears to have had gross hematuria.  Urinalysis suggest possible urinary tract infection versus contamination with blood.  Will send urine culture given his atypical symptoms.  Meanwhile begin Bactrim double strength tablets twice daily for 1 week.  If urine culture shows no evidence of infection, would recommend urology consultation for cystoscopy for gross hematuria as well as a renal CT scan to evaluate for renal cell carcinoma.

## 2019-06-10 LAB — URINE CULTURE
MICRO NUMBER:: 1044813
SPECIMEN QUALITY:: ADEQUATE

## 2019-06-12 ENCOUNTER — Other Ambulatory Visit: Payer: Self-pay | Admitting: Family Medicine

## 2019-06-12 DIAGNOSIS — R310 Gross hematuria: Secondary | ICD-10-CM

## 2019-06-12 DIAGNOSIS — R82998 Other abnormal findings in urine: Secondary | ICD-10-CM

## 2019-06-12 NOTE — Addendum Note (Signed)
Addended by: Shary Decamp B on: 06/12/2019 11:04 AM   Modules accepted: Orders

## 2019-06-15 ENCOUNTER — Other Ambulatory Visit: Payer: Self-pay

## 2019-06-15 ENCOUNTER — Ambulatory Visit
Admission: RE | Admit: 2019-06-15 | Discharge: 2019-06-15 | Disposition: A | Discharge status: Home / Self Care | Payer: PPO | Source: Ambulatory Visit | Attending: Family Medicine | Admitting: Family Medicine

## 2019-06-15 DIAGNOSIS — N323 Diverticulum of bladder: Secondary | ICD-10-CM | POA: Diagnosis not present

## 2019-06-15 DIAGNOSIS — R310 Gross hematuria: Secondary | ICD-10-CM

## 2019-06-15 DIAGNOSIS — R82998 Other abnormal findings in urine: Secondary | ICD-10-CM

## 2019-06-15 MED ORDER — IOPAMIDOL (ISOVUE-300) INJECTION 61%
100.0000 mL | Freq: Once | INTRAVENOUS | Status: AC | PRN
  Administered 2019-06-15: 100 mL via INTRAVENOUS

## 2019-06-19 DIAGNOSIS — R310 Gross hematuria: Secondary | ICD-10-CM | POA: Diagnosis not present

## 2019-06-19 DIAGNOSIS — N323 Diverticulum of bladder: Secondary | ICD-10-CM | POA: Diagnosis not present

## 2019-06-19 DIAGNOSIS — Z89512 Acquired absence of left leg below knee: Secondary | ICD-10-CM | POA: Diagnosis not present

## 2019-06-23 ENCOUNTER — Encounter: Payer: Self-pay | Admitting: Family Medicine

## 2019-06-26 ENCOUNTER — Encounter: Payer: Self-pay | Admitting: Family Medicine

## 2019-06-29 ENCOUNTER — Other Ambulatory Visit: Payer: Self-pay

## 2019-06-29 ENCOUNTER — Encounter: Payer: Self-pay | Admitting: Family Medicine

## 2019-06-29 ENCOUNTER — Ambulatory Visit (INDEPENDENT_AMBULATORY_CARE_PROVIDER_SITE_OTHER): Payer: PPO | Admitting: Family Medicine

## 2019-06-29 VITALS — BP 142/60 | HR 80 | Temp 97.2°F | Resp 16 | Ht 65.0 in

## 2019-06-29 DIAGNOSIS — H6121 Impacted cerumen, right ear: Secondary | ICD-10-CM

## 2019-06-29 NOTE — Progress Notes (Signed)
Subjective:    Patient ID: Eric [name]., male    DOB: 01/03/1928, 85 y.o.   MRN: [id number]  HPI Patient presents today with right-sided cerumen impaction.  This was discovered when he went to have his hearing aids evaluated and was found to have a cerumen impaction causing right-sided hearing loss.  He is here today to have this removed Past Medical History:  Diagnosis Date   Atrial flutter ([place])    s/p RFCA   CAD (coronary artery disease)    cath 2003, occluded S-RCA, occluded S-Dx, L-LAD ok, s/p PTCA to LAD   Cataract    CKD (chronic kidney disease) stage 3, GFR 30-59 ml/min    Diabetes mellitus    diet controlled   Hearing aid worn    bilateral   History of kidney stones    Long term (current) use of anticoagulants    Neuromuscular disorder ([facility])    OSA (obstructive sleep apnea) 12/11   very mild, AHI 7/hr   Persistent atrial fibrillation ([place]) 09/26/2015   Pure hyperglyceridemia    PVD (peripheral vascular disease) ([place])    angioplasty of his right lower extremity in [place] by Dr.[name] 2013   Unspecified essential hypertension    Wears dentures    full upper and lower   Past Surgical History:  Procedure Laterality Date   ABDOMINAL AORTOGRAM W/LOWER EXTREMITY N/A 11/15/2017   Procedure: ABDOMINAL AORTOGRAM W/LOWER EXTREMITY;  Surgeon: [name], MD;  Location: [facility];  Service: Cardiovascular;  Laterality: N/A;   Adenosine Myoview  3/06   EF 56%, neg. Ischemia   Adenosine Myoview  02/18/07   nml   AMPUTATION Left 12/15/2017   Procedure: AMPUTATION BELOW KNEE;  Surgeon: Algernon [name], MD;  Location: [facility];  Service: Vascular;  Laterality: Left;   ANGIOPLASTY  1/99   CAD- diogonal with rotational artherectomy   Arthrectomy     of LAD & PTCA   BLEPHAROPLASTY Bilateral    CARDIAC CATHETERIZATION  1/00   CARDIOVERSION  1/04   CARDIOVERSION  5/07   hospital- a flutter   CARPAL TUNNEL RELEASE     ? bilateral    CATARACT EXTRACTION W/PHACO Right 07/28/2017   Procedure: CATARACT EXTRACTION PHACO AND INTRAOCULAR LENS PLACEMENT ([place]) RIGHT DIABETIC;  Surgeon: [name], MD;  Location: [place];  Service: Ophthalmology;  Laterality: Right;  Diabetic - diet controlled   CATARACT EXTRACTION W/PHACO Left 08/18/2017   Procedure: CATARACT EXTRACTION PHACO AND INTRAOCULAR LENS PLACEMENT ([facility]);  Surgeon: [name], MD;  Location: [place];  Service: Ophthalmology;  Laterality: Left;  DIABETES - oral meds   COLONOSCOPY W/ BIOPSIES  10/01/06   sigmoid polyp bx neg, 3 years   CORONARY ANGIOPLASTY  4/03   cutting balloon PTCA pLAD into Diag   CORONARY ARTERY BYPASS GRAFT  2000   LIMA-LAD, SVG-RCA, SVG-Diag; SVG-Diag & SVG-RCA occluded 2003   FRACTURE SURGERY     HAND SURGERY  08/27/09   R thumb procedure wit Scaphoid Gragt and screws, Dr [name]   LOWER EXTREMITY ANGIOGRAPHY Right 01/25/2019   Procedure: LOWER EXTREMITY ANGIOGRAPHY;  Surgeon: Katha Cabal, MD;  Location: [facility];  Service: Cardiovascular;  Laterality: Right;   NM MYOVIEW LTD  4/11   normal   PERIPHERAL VASCULAR CATHETERIZATION Right 06/27/2015   Procedure: Lower Extremity Angiography;  Surgeon: Algernon [name], MD;  Location: [facility];  Service: Cardiovascular;  Laterality: Right;   PERIPHERAL VASCULAR  CATHETERIZATION  06/27/2015   Procedure: Lower Extremity Intervention;  Surgeon: Algernon [name], MD;  Location: [facility];  Service: Cardiovascular;;   Current Outpatient Medications on File Prior to Visit  Medication Sig Dispense Refill   acetaminophen (TYLENOL) 325 MG tablet Take 2 tablets (650 mg total) by mouth every 6 (six) hours as needed for mild pain (or temp >/= 101 F).     amLODipine (NORVASC) 10 MG tablet Take 1 tablet by mouth once daily 90 tablet 3   apixaban (ELIQUIS) 5 MG TABS tablet Take 1 tablet (5 mg total) by mouth 2 (two) times daily. 60 tablet  1   B Complex Vitamins (B COMPLEX-B12) TABS Take 1 tablet by mouth daily.      cholecalciferol (VITAMIN D) 1000 UNITS tablet Take 1,000 Units by mouth 2 (two) times a week. Takes on Mon and Fri     Cinnamon 500 MG capsule Take 1,000 mg by mouth 2 (two) times daily.      ferrous sulfate 325 (65 FE) MG EC tablet Take 325 mg by mouth 3 (three) times daily with meals.     finasteride (PROSCAR) 5 MG tablet TAKE 1 TABLET BY MOUTH ONCE DAILY 90 tablet 3   fish oil-omega-3 fatty acids 1000 MG capsule Take 1,000 mg by mouth daily.      lisinopril (PRINIVIL,ZESTRIL) 20 MG tablet Take 1 tablet (20 mg total) by mouth daily. 30 tablet 0   Multiple Vitamin (DAILY MULTIVITAMIN PO) Take 1 tablet by mouth daily.       ONETOUCH ULTRA test strip USE 1 STRIP TO CHECK GLUCOSE ONCE DAILY 50 each 5   pentoxifylline (TRENTAL) 400 MG CR tablet TAKE 1 TABLET BY MOUTH THREE TIMES DAILY WITH MEALS (Patient taking differently: Per patient he is only taking it every 12 hrs.) 90 tablet 3   rosuvastatin (CRESTOR) 40 MG tablet Take 1 tablet by mouth once daily 90 tablet 3   saccharomyces boulardii (FLORASTOR) 250 MG capsule Take 1 capsule (250 mg total) by mouth daily. 90 capsule 3   tamsulosin (FLOMAX) 0.4 MG CAPS capsule Take 1 capsule by mouth once daily 90 capsule 3   hydrALAZINE (APRESOLINE) 25 MG tablet Take 3 tablets (75 mg total) by mouth every 8 (eight) hours. 270 tablet 3   No current facility-administered medications on file prior to visit.    Allergies  Allergen Reactions   Isosorbide Mononitrate Other (See Comments)    Unknown, doesn't remember    Ramipril Cough and Other (See Comments)   Quinidine Gluconate Rash   Quinidine Gluconate Other (See Comments) and Rash   Social History   Socioeconomic History   Marital status: Married    Spouse name: Not on file   Number of children: 5   Years of education: Not on file   Highest education level: Not on file  Occupational History    Occupation: [employer]    Employer: RETIRED  Social Designer, fashion/clothing strain: Not on file   Food insecurity    Worry: Not on file    Inability: Not on file   Transportation needs    Medical: Not on file    Non-medical: Not on file  Tobacco Use   Smoking status: Never Smoker   Smokeless tobacco: Former Systems developer  Substance and Sexual Activity   Alcohol use: No   Drug use: No   Sexual activity: Never  Lifestyle   Physical activity    Days per week:  Not on file    Minutes per session: Not on file   Stress: Not on file  Relationships   Social connections    Talks on phone: Not on file    Gets together: Not on file    Attends religious service: Not on file    Active member of club or organization: Not on file    Attends meetings of clubs or organizations: Not on file    Relationship status: Not on file   Intimate partner violence    Fear of current or ex partner: Not on file    Emotionally abused: Not on file    Physically abused: Not on file    Forced sexual activity: Not on file  Other Topics Concern   Not on file  Social History Narrative   Retired now does some antiques   Retired from Theatre manager and dye work   Married 1951   5 kids      Review of Systems  All other systems reviewed and are negative.      Objective:   Physical Exam Vitals signs reviewed.  Constitutional:      Appearance: Normal appearance.  Cardiovascular:     Rate and Rhythm: Normal rate. Rhythm irregular.     Heart sounds: Normal heart sounds.  Pulmonary:     Effort: Pulmonary effort is normal.     Breath sounds: Normal breath sounds.  Neurological:     Mental Status: He is alert.    Right-sided cerumen impaction       Assessment & Plan:  Hearing loss due to cerumen impaction, right  This was removed using a combination of irrigation and lavage, curette, and alligator forceps.  Patient tolerated the procedure without complication

## 2019-07-11 ENCOUNTER — Telehealth: Payer: Self-pay | Admitting: Cardiology

## 2019-07-11 DIAGNOSIS — R8271 Bacteriuria: Secondary | ICD-10-CM | POA: Diagnosis not present

## 2019-07-11 NOTE — Telephone Encounter (Signed)

## 2019-07-12 ENCOUNTER — Encounter: Payer: Self-pay | Admitting: Cardiology

## 2019-07-12 ENCOUNTER — Telehealth (INDEPENDENT_AMBULATORY_CARE_PROVIDER_SITE_OTHER): Payer: PPO | Admitting: Cardiology

## 2019-07-12 VITALS — BP 104/76 | HR 76 | Ht 65.5 in | Wt 165.0 lb

## 2019-07-12 DIAGNOSIS — I4891 Unspecified atrial fibrillation: Secondary | ICD-10-CM

## 2019-07-12 DIAGNOSIS — E785 Hyperlipidemia, unspecified: Secondary | ICD-10-CM

## 2019-07-12 DIAGNOSIS — E782 Mixed hyperlipidemia: Secondary | ICD-10-CM

## 2019-07-12 DIAGNOSIS — I10 Essential (primary) hypertension: Secondary | ICD-10-CM

## 2019-07-12 DIAGNOSIS — R8271 Bacteriuria: Secondary | ICD-10-CM | POA: Diagnosis not present

## 2019-07-12 DIAGNOSIS — I2510 Atherosclerotic heart disease of native coronary artery without angina pectoris: Secondary | ICD-10-CM

## 2019-07-12 NOTE — Patient Instructions (Signed)

## 2019-07-12 NOTE — Progress Notes (Signed)
Virtual Visit via Telephone Note   This visit type was conducted due to national recommendations for restrictions regarding the COVID-19 Pandemic (e.g. social distancing) in an effort to limit this patient's exposure and mitigate transmission in our community.  Due to his co-morbid illnesses, this patient is at least at moderate risk for complications without adequate follow up.  This format is felt to be most appropriate for this patient at this time.  The patient did not have access to video technology/had technical difficulties with video requiring transitioning to audio format only (telephone).  All issues noted in this document were discussed and addressed.  No physical exam could be performed with this format.  Please refer to the patient's chart for his  consent to telehealth for [facility].   Date:  07/12/2019   ID:  Eric [name]., DOB 09-28-1927, MRN [id number]  Patient Location: Home Provider Location: Home  PCP:  [name], MD  Cardiologist:  Dr Carlyle Dolly MD Electrophysiologist:  None   Evaluation Performed:  Follow-Up Visit  Chief Complaint:  Follow up  History of Present Illness:    Eric [name]. is a 85 y.o. male seen today for follow up of the following medical problems.    1. CAD -CABG 2000 w/ LIMA-LAD, SVG-Diag, SVG-PDA. Subsequent PTCA of LAD in 2003   - no recent chest pain. No SOB or DOE - compliant with meds  2. Permanent afib - no recent palpitations.  - had some hematuria that cleared, has f/u with urology.   3. CKD III - followed by nephrology at [facility]  4. DM2 - followed by pcp   5. PAD - history of prior intervention - prior BKA - followed by vascular  6. HTN - notes mention some prior orthostatic symptoms. -compliant with meds, no recent symptoms  - taking hydralzine 75mg  bid  7. Hyperlipidemia 04/2019 TC 98 TG 120 HDL 33 LDL 44    The patient does not have symptoms concerning for  COVID-19 infection (fever, chills, cough, or new shortness of breath).    Past Medical History:  Diagnosis Date  . Atrial flutter ([place])    s/p RFCA  . CAD (coronary artery disease)    cath 2003, occluded S-RCA, occluded S-Dx, L-LAD ok, s/p PTCA to LAD  . Cataract   . CKD (chronic kidney disease) stage 3, GFR 30-59 ml/min   . Diabetes mellitus    diet controlled  . Hearing aid worn    bilateral  . History of kidney stones   . Long term (current) use of anticoagulants   . Neuromuscular disorder ([place])   . OSA (obstructive sleep apnea) 12/11   very mild, AHI 7/hr  . Persistent atrial fibrillation ([place]) 09/26/2015  . Pure hyperglyceridemia   . PVD (peripheral vascular disease) ([place])    angioplasty of his right lower extremity in [place] by Dr.[name] 2013  . Unspecified essential hypertension   . Wears dentures    full upper and lower   Past Surgical History:  Procedure Laterality Date  . ABDOMINAL AORTOGRAM W/LOWER EXTREMITY N/A 11/15/2017   Procedure: ABDOMINAL AORTOGRAM W/LOWER EXTREMITY;  Surgeon: [name], MD;  Location: [place] [facility];  Service: Cardiovascular;  Laterality: N/A;  . Adenosine Myoview  3/06   EF 56%, neg. Ischemia  . Adenosine Myoview  02/18/07   nml  . AMPUTATION Left 12/15/2017   Procedure: AMPUTATION BELOW KNEE;  Surgeon: Algernon [name], MD;  Location: [facility];  Service: Vascular;  Laterality: Left;  . ANGIOPLASTY  1/99   CAD- diogonal with rotational artherectomy  . Arthrectomy     of LAD & PTCA  . BLEPHAROPLASTY Bilateral   . CARDIAC CATHETERIZATION  1/00  . CARDIOVERSION  1/04  . CARDIOVERSION  5/07   hospital- a flutter  . CARPAL TUNNEL RELEASE     ? bilateral  . CATARACT EXTRACTION W/PHACO Right 07/28/2017   Procedure: CATARACT EXTRACTION PHACO AND INTRAOCULAR LENS PLACEMENT ([place]) RIGHT DIABETIC;  Surgeon: [name], MD;  Location: [facility];  Service: Ophthalmology;  Laterality: Right;  Diabetic - diet controlled  .  CATARACT EXTRACTION W/PHACO Left 08/18/2017   Procedure: CATARACT EXTRACTION PHACO AND INTRAOCULAR LENS PLACEMENT ([facility]);  Surgeon: [name], MD;  Location: [place];  Service: Ophthalmology;  Laterality: Left;  DIABETES - oral meds  . COLONOSCOPY W/ BIOPSIES  10/01/06   sigmoid polyp bx neg, 3 years  . CORONARY ANGIOPLASTY  4/03   cutting balloon PTCA pLAD into Diag  . CORONARY ARTERY BYPASS GRAFT  2000   LIMA-LAD, SVG-RCA, SVG-Diag; SVG-Diag & SVG-RCA occluded 2003  . FRACTURE SURGERY    . HAND SURGERY  08/27/09   R thumb procedure wit Scaphoid Gragt and screws, Dr [name]  . LOWER EXTREMITY ANGIOGRAPHY Right 01/25/2019   Procedure: LOWER EXTREMITY ANGIOGRAPHY;  Surgeon: Katha Cabal, MD;  Location: [place] [facility];  Service: Cardiovascular;  Laterality: Right;  . NM MYOVIEW LTD  4/11   normal  . PERIPHERAL VASCULAR CATHETERIZATION Right 06/27/2015   Procedure: Lower Extremity Angiography;  Surgeon: Algernon [name], MD;  Location: [facility];  Service: Cardiovascular;  Laterality: Right;  . PERIPHERAL VASCULAR CATHETERIZATION  06/27/2015   Procedure: Lower Extremity Intervention;  Surgeon: Algernon [name], MD;  Location: [place] [facility];  Service: Cardiovascular;;     No outpatient medications have been marked as taking for the 07/12/19 encounter (Appointment) with Arnoldo Lenis, MD.     Allergies:   Isosorbide mononitrate, Ramipril, Quinidine gluconate, and Quinidine gluconate   Social History   Tobacco Use  . Smoking status: Never Smoker  . Smokeless tobacco: Former Network engineer Use Topics  . Alcohol use: No  . Drug use: No     Family Hx: The patient's family history includes Aneurysm in his father; Breast cancer in his sister; Hypertension in his father and mother; Liver cancer in his brother; Lung cancer in his brother and brother; Other in his brother and sister; Prostate cancer in his brother; Stroke in his father; Thrombosis in his  brother.  ROS:   Please see the history of present illness.     All other systems reviewed and are negative.   Prior CV studies:   The following studies were reviewed today:    Labs/Other Tests and Data Reviewed:    EKG:  No ECG reviewed.  Recent Labs: 05/03/2019: ALT 19; BUN 19; Creat 1.17; Hemoglobin 11.5; Platelets 201; Potassium 4.8; Sodium 139   Recent Lipid Panel Lab Results  Component Value Date/Time   CHOL 98 05/03/2019 08:40 AM   TRIG 120 05/03/2019 08:40 AM   HDL 33 (L) 05/03/2019 08:40 AM   CHOLHDL 3.0 05/03/2019 08:40 AM   LDLCALC 44 05/03/2019 08:40 AM   LDLDIRECT 99.1 02/19/2011 08:23 AM    Wt Readings from Last 3 Encounters:  05/15/19 167 lb (75.8 kg)  04/13/19 165 lb (74.8 kg)  03/08/19 155 lb 6.8 oz (70.5 kg)  Objective:    Vital Signs:   Today's Vitals   07/12/19 1451 07/12/19 1455  BP: (!) 172/86 104/76  Pulse: (!) 58 76  Weight: 165 lb (74.8 kg)   Height: 5' 5.5" (1.664 m)    Body mass index is 27.04 kg/m. Normal affect. Normal speech pattern andtone. Comfortable, no apparnet distress  ASSESSMENT & PLAN:    1. CAD - no symptoms, conitnue currentmeds  2. Afib - no symptoms, continue current meds   3. HTN - initially elevated but recheck at goal, continue current meds   4. Hyperlipidemia - at goal, continue current meds  COVID-19 Education: The signs and symptoms of COVID-19 were discussed with the patient and how to seek care for testing (follow up with PCP or arrange E-visit).  The importance of social distancing was discussed today.  Time:   Today, I have spent 15 minutes with the patient with telehealth technology discussing the above problems.     Medication Adjustments/Labs and Tests Ordered: Current medicines are reviewed at length with the patient today.  Concerns regarding medicines are outlined above.   Tests Ordered: No orders of the defined types were placed in this encounter.   Medication Changes:  No orders of the defined types were placed in this encounter.   Follow Up:  Either In Person or Virtual in 6 month(s)  Signed, Carlyle Dolly, MD  07/12/2019 2:27 PM    [place]

## 2019-07-13 DIAGNOSIS — N323 Diverticulum of bladder: Secondary | ICD-10-CM | POA: Diagnosis not present

## 2019-07-13 DIAGNOSIS — N3020 Other chronic cystitis without hematuria: Secondary | ICD-10-CM | POA: Diagnosis not present

## 2019-07-13 DIAGNOSIS — N401 Enlarged prostate with lower urinary tract symptoms: Secondary | ICD-10-CM | POA: Diagnosis not present

## 2019-07-13 DIAGNOSIS — R3914 Feeling of incomplete bladder emptying: Secondary | ICD-10-CM | POA: Diagnosis not present

## 2019-07-13 DIAGNOSIS — R310 Gross hematuria: Secondary | ICD-10-CM | POA: Diagnosis not present

## 2019-07-24 ENCOUNTER — Ambulatory Visit (INDEPENDENT_AMBULATORY_CARE_PROVIDER_SITE_OTHER): Payer: PPO | Admitting: Family Medicine

## 2019-07-24 ENCOUNTER — Other Ambulatory Visit: Payer: Self-pay

## 2019-07-24 ENCOUNTER — Encounter: Payer: Self-pay | Admitting: Family Medicine

## 2019-07-24 VITALS — BP 130/54 | HR 68 | Temp 96.8°F | Resp 20 | Ht 65.0 in | Wt 172.0 lb

## 2019-07-24 DIAGNOSIS — M7989 Other specified soft tissue disorders: Secondary | ICD-10-CM | POA: Diagnosis not present

## 2019-07-24 MED ORDER — FUROSEMIDE 40 MG PO TABS: 40.0000 mg | ORAL_TABLET | Freq: Every day | ORAL | 0 refills | Status: DC | PRN

## 2019-07-24 NOTE — Progress Notes (Signed)
Subjective:    Patient ID: Eric [name]., male    DOB: November 17, 1927, 85 y.o.   MRN: [id number]  HPI Patient reports that he has been eating more salt over the last few days.  Over the last few days his right leg has swollen considerably below the level of his knee.  He has +2 pitting edema in his right leg distal to the knee.  He is missing his leg distal to the knee on his left side.  He denies any chest pain or shortness of breath.  His blood pressure today is excellent.  He is not hypoxic.  His lungs are clear to auscultation bilaterally with no crackles or rails.  Patient appears slightly fluid overloaded on exam but is in no respiratory distress.  He does take amlodipine however he has been on this medication for years. Past Medical History:  Diagnosis Date  . Atrial flutter ([place])    s/p RFCA  . CAD (coronary artery disease)    cath 2003, occluded S-RCA, occluded S-Dx, L-LAD ok, s/p PTCA to LAD  . Cataract   . CKD (chronic kidney disease) stage 3, GFR 30-59 ml/min   . Diabetes mellitus    diet controlled  . Hearing aid worn    bilateral  . History of kidney stones   . Long term (current) use of anticoagulants   . Neuromuscular disorder ([place])   . OSA (obstructive sleep apnea) 12/11   very mild, AHI 7/hr  . Persistent atrial fibrillation ([place]) 09/26/2015  . Pure hyperglyceridemia   . PVD (peripheral vascular disease) ([place])    angioplasty of his right lower extremity in [place] by Dr.[name] 2013  . Unspecified essential hypertension   . Wears dentures    full upper and lower   Past Surgical History:  Procedure Laterality Date  . ABDOMINAL AORTOGRAM W/LOWER EXTREMITY N/A 11/15/2017   Procedure: ABDOMINAL AORTOGRAM W/LOWER EXTREMITY;  Surgeon: [name], MD;  Location: [facility];  Service: Cardiovascular;  Laterality: N/A;  . Adenosine Myoview  3/06   EF 56%, neg. Ischemia  . Adenosine Myoview  02/18/07   nml  . AMPUTATION Left 12/15/2017   Procedure:  AMPUTATION BELOW KNEE;  Surgeon: Algernon [name], MD;  Location: [facility];  Service: Vascular;  Laterality: Left;  . ANGIOPLASTY  1/99   CAD- diogonal with rotational artherectomy  . Arthrectomy     of LAD & PTCA  . BLEPHAROPLASTY Bilateral   . CARDIAC CATHETERIZATION  1/00  . CARDIOVERSION  1/04  . CARDIOVERSION  5/07   hospital- a flutter  . CARPAL TUNNEL RELEASE     ? bilateral  . CATARACT EXTRACTION W/PHACO Right 07/28/2017   Procedure: CATARACT EXTRACTION PHACO AND INTRAOCULAR LENS PLACEMENT ([place]) RIGHT DIABETIC;  Surgeon: [name], MD;  Location: [facility];  Service: Ophthalmology;  Laterality: Right;  Diabetic - diet controlled  . CATARACT EXTRACTION W/PHACO Left 08/18/2017   Procedure: CATARACT EXTRACTION PHACO AND INTRAOCULAR LENS PLACEMENT ([facility]);  Surgeon: [name], MD;  Location: [facility];  Service: Ophthalmology;  Laterality: Left;  DIABETES - oral meds  . COLONOSCOPY W/ BIOPSIES  10/01/06   sigmoid polyp bx neg, 3 years  . CORONARY ANGIOPLASTY  4/03   cutting balloon PTCA pLAD into Diag  . CORONARY ARTERY BYPASS GRAFT  2000   LIMA-LAD, SVG-RCA, SVG-Diag; SVG-Diag & SVG-RCA occluded 2003  . FRACTURE SURGERY    . HAND SURGERY  08/27/09   R thumb procedure wit  Scaphoid Gragt and screws, Dr [name]  . LOWER EXTREMITY ANGIOGRAPHY Right 01/25/2019   Procedure: LOWER EXTREMITY ANGIOGRAPHY;  Surgeon: Katha Cabal, MD;  Location: [facility];  Service: Cardiovascular;  Laterality: Right;  . NM MYOVIEW LTD  4/11   normal  . PERIPHERAL VASCULAR CATHETERIZATION Right 06/27/2015   Procedure: Lower Extremity Angiography;  Surgeon: Algernon [name], MD;  Location: [place] [facility];  Service: Cardiovascular;  Laterality: Right;  . PERIPHERAL VASCULAR CATHETERIZATION  06/27/2015   Procedure: Lower Extremity Intervention;  Surgeon: Algernon [name], MD;  Location: [facility];  Service: Cardiovascular;;   Current Outpatient Medications  on File Prior to Visit  Medication Sig Dispense Refill  . acetaminophen (TYLENOL) 325 MG tablet Take 2 tablets (650 mg total) by mouth every 6 (six) hours as needed for mild pain (or temp >/= 101 F).    [name] amLODipine (NORVASC) 10 MG tablet Take 1 tablet by mouth once daily 90 tablet 3  . B Complex Vitamins (B COMPLEX-B12) TABS Take 1 tablet by mouth daily.     . cholecalciferol (VITAMIN D) 1000 UNITS tablet Take 1,000 Units by mouth 2 (two) times a week. Takes on Mon and Fri    . Cinnamon 500 MG capsule Take 1,000 mg by mouth 2 (two) times daily.     . ferrous sulfate 325 (65 FE) MG EC tablet Take 325 mg by mouth 3 (three) times daily with meals.    . finasteride (PROSCAR) 5 MG tablet TAKE 1 TABLET BY MOUTH ONCE DAILY 90 tablet 3  . fish oil-omega-3 fatty acids 1000 MG capsule Take 1,000 mg by mouth daily.     [name] lisinopril (PRINIVIL,ZESTRIL) 20 MG tablet Take 1 tablet (20 mg total) by mouth daily. 30 tablet 0  . Multiple Vitamin (DAILY MULTIVITAMIN PO) Take 1 tablet by mouth daily.      [name] ULTRA test strip USE 1 STRIP TO CHECK GLUCOSE ONCE DAILY 50 each 5  . pentoxifylline (TRENTAL) 400 MG CR tablet TAKE 1 TABLET BY MOUTH THREE TIMES DAILY WITH MEALS (Patient taking differently: Per patient he is only taking it every 12 hrs.) 90 tablet 3  . rosuvastatin (CRESTOR) 40 MG tablet Take 1 tablet by mouth once daily 90 tablet 3  . saccharomyces boulardii (FLORASTOR) 250 MG capsule Take 1 capsule (250 mg total) by mouth daily. 90 capsule 3  . tamsulosin (FLOMAX) 0.4 MG CAPS capsule Take 1 capsule by mouth once daily 90 capsule 3  . apixaban (ELIQUIS) 5 MG TABS tablet Take 1 tablet (5 mg total) by mouth 2 (two) times daily. 60 tablet 1  . hydrALAZINE (APRESOLINE) 25 MG tablet Take 3 tablets (75 mg total) by mouth every 8 (eight) hours. (Patient taking differently: Take 75 mg by mouth 2 (two) times daily. ) 270 tablet 3   No current facility-administered medications on file prior to visit.    Allergies  Allergen Reactions  . Isosorbide Mononitrate Other (See Comments)    Unknown, doesn't remember   . Ramipril Cough and Other (See Comments)  . Quinidine Gluconate Rash  . Quinidine Gluconate Other (See Comments) and Rash   Social History   Socioeconomic History  . Marital status: Married    Spouse name: Not on file  . Number of children: 5  . Years of education: Not on file  . Highest education level: Not on file  Occupational History  . Occupation: [employer]    Employer: RETIRED  Tobacco Use  . Smoking status: Never Smoker  . Smokeless tobacco: Former Network engineer and Sexual Activity  . Alcohol use: No  . Drug use: No  . Sexual activity: Never  Other Topics Concern  . Not on file  Social History Narrative   Retired now does some antiques   Retired from Theatre manager and dye work   Married 1951   5 kids   Social Determinants of Radio broadcast assistant Strain:   . Difficulty of Paying Living Expenses: Not on file  Food Insecurity:   . Worried About Charity fundraiser in the Last Year: Not on file  . Ran Out of Food in the Last Year: Not on file  Transportation Needs:   . Lack of Transportation (Medical): Not on file  . Lack of Transportation (Non-Medical): Not on file  Physical Activity:   . Days of Exercise per Week: Not on file  . Minutes of Exercise per Session: Not on file  Stress:   . Feeling of Stress : Not on file  Social Connections:   . Frequency of Communication with Friends and Family: Not on file  . Frequency of Social Gatherings with Friends and Family: Not on file  . Attends Religious Services: Not on file  . Active Member of Clubs or Organizations: Not on file  . Attends Archivist Meetings: Not on file  . Marital Status: Not on file  Intimate Partner Violence:   . Fear of Current or Ex-Partner: Not on file  . Emotionally Abused: Not on file  . Physically Abused: Not on file  . Sexually Abused: Not on file       Review of Systems  All other systems reviewed and are negative.      Objective:   Physical Exam Vitals reviewed.  Constitutional:      Appearance: Normal appearance.  Cardiovascular:     Rate and Rhythm: Normal rate and regular rhythm.     Pulses: Normal pulses.     Heart sounds: Normal heart sounds.  Pulmonary:     Effort: Pulmonary effort is normal.     Breath sounds: Normal breath sounds. No wheezing, rhonchi or rales.  Abdominal:     General: Abdomen is flat.     Palpations: Abdomen is soft.  Musculoskeletal:     Right lower leg: Edema present.  Neurological:     Mental Status: He is alert.           Assessment & Plan:  Leg swelling - Plan: furosemide (LASIX) 40 MG tablet, BASIC METABOLIC PANEL WITH GFR  I suspect the patient has pitting edema due to consumption of sodium over the last several days.  Patient does not appear to have pulmonary edema however.  I will start the patient on Lasix 40 mg daily for the next 3 days and have him call me later this week with an update.  If the swelling has improved at that point we will discontinue the Lasix and monitor for recurrence.  If swelling does not improve or becomes recurrent I would recommend an echocardiogram to evaluate for decreased systolic function.

## 2019-07-25 LAB — BASIC METABOLIC PANEL WITH GFR
BUN/Creatinine Ratio: 18 (calc) (ref 6–22)
BUN: 27 mg/dL — ABNORMAL HIGH (ref 7–25)
CO2: 23 mmol/L (ref 20–32)
Calcium: 8.6 mg/dL (ref 8.6–10.3)
Chloride: 108 mmol/L (ref 98–110)
Creat: 1.54 mg/dL — ABNORMAL HIGH (ref 0.70–1.11)
GFR, Est African American: 46 mL/min/{1.73_m2} — ABNORMAL LOW (ref >=60)
GFR, Est Non African American: 39 mL/min/{1.73_m2} — ABNORMAL LOW (ref >=60)
Glucose, Bld: 127 mg/dL — ABNORMAL HIGH (ref 65–99)
Potassium: 5 mmol/L (ref 3.5–5.3)
Sodium: 139 mmol/L (ref 135–146)

## 2019-07-28 ENCOUNTER — Encounter: Payer: Self-pay | Admitting: Family Medicine

## 2019-07-28 ENCOUNTER — Ambulatory Visit (INDEPENDENT_AMBULATORY_CARE_PROVIDER_SITE_OTHER): Payer: PPO | Admitting: Family Medicine

## 2019-07-28 ENCOUNTER — Other Ambulatory Visit: Payer: Self-pay

## 2019-07-28 VITALS — BP 102/50 | HR 82 | Temp 97.2°F | Resp 14 | Ht 65.0 in | Wt 168.0 lb

## 2019-07-28 DIAGNOSIS — M7989 Other specified soft tissue disorders: Secondary | ICD-10-CM

## 2019-07-28 NOTE — Progress Notes (Signed)
Subjective:    Patient ID: Eric [name]., male    DOB: June 01, 1928, 85 y.o.   MRN: [id number]  HPI  07/24/19 Patient reports that he has been eating more salt over the last few days.  Over the last few days his right leg has swollen considerably below the level of his knee.  He has +2 pitting edema in his right leg distal to the knee.  He is missing his leg distal to the knee on his left side.  He denies any chest pain or shortness of breath.  His blood pressure today is excellent.  He is not hypoxic.  His lungs are clear to auscultation bilaterally with no crackles or rails.  Patient appears slightly fluid overloaded on exam but is in no respiratory distress.  He does take amlodipine however he has been on this medication for years.  At that time, my plan was: I suspect the patient has pitting edema due to consumption of sodium over the last several days.  Patient does not appear to have pulmonary edema however.  I will start the patient on Lasix 40 mg daily for the next 3 days and have him call me later this week with an update.  If the swelling has improved at that point we will discontinue the Lasix and monitor for recurrence.  If swelling does not improve or becomes recurrent I would recommend an echocardiogram to evaluate for decreased systolic function.  07/28/19 Here for follow up.  Despite having pitting edema at his last visit his creatinine had risen from 1.17-1.54.  However we still have the patient Lasix 40 mg a day for 3 days. Wt Readings from Last 3 Encounters:  07/28/19 168 lb (76.2 kg)  07/24/19 172 lb (78 kg)  07/12/19 165 lb (74.8 kg)   Patient has lost 4 pounds since I last saw him.  Unfortunately he also seems more wobbly on his feet.  Patient's weight has dropped dramatically.  The swelling has also improved dramatically although he still has trace edema around his right ankle.  The edema is much less than when I last saw him.  His lungs are continuing to be clear to  auscultation.  There is no residual swelling seen in his fingers or at his wrist.  The patient appears euvolemic.  His blood pressure today is slightly low however he states that he checks it at home and earlier this morning it was 150/80.  Office Visit on 07/24/2019  Component Date Value Ref Range Status  . Glucose, Bld 07/24/2019 127* 65 - 99 mg/dL Final   Comment: .            Fasting reference interval . For someone without known diabetes, a glucose value >125 mg/dL indicates that they may have diabetes and this should be confirmed with a follow-up test. .   . BUN 07/24/2019 27* 7 - 25 mg/dL Final  . Creat 07/24/2019 1.54* 0.70 - 1.11 mg/dL Final   Comment: For patients >46 years of age, the reference limit for Creatinine is approximately 13% higher for people identified as African-American. .   . GFR, Est Non African American 07/24/2019 39* > OR = 60 mL/min/1.33m2 Final  . GFR, Est African American 07/24/2019 46* > OR = 60 mL/min/1.58m2 Final  . BUN/Creatinine Ratio 07/24/2019 18  6 - 22 (calc) Final  . Sodium 07/24/2019 139  135 - 146 mmol/L Final  . Potassium 07/24/2019 5.0  3.5 - 5.3 mmol/L Final  . Chloride  07/24/2019 108  98 - 110 mmol/L Final  . CO2 07/24/2019 23  20 - 32 mmol/L Final  . Calcium 07/24/2019 8.6  8.6 - 10.3 mg/dL Final    Past Medical History:  Diagnosis Date  . Atrial flutter ([place])    s/p RFCA  . CAD (coronary artery disease)    cath 2003, occluded S-RCA, occluded S-Dx, L-LAD ok, s/p PTCA to LAD  . Cataract   . CKD (chronic kidney disease) stage 3, GFR 30-59 ml/min   . Diabetes mellitus    diet controlled  . Hearing aid worn    bilateral  . History of kidney stones   . Long term (current) use of anticoagulants   . Neuromuscular disorder ([place])   . OSA (obstructive sleep apnea) 12/11   very mild, AHI 7/hr  . Persistent atrial fibrillation ([place]) 09/26/2015  . Pure hyperglyceridemia   . PVD (peripheral vascular disease) ([place])    angioplasty of  his right lower extremity in [place] by Dr.[name] 2013  . Unspecified essential hypertension   . Wears dentures    full upper and lower   Past Surgical History:  Procedure Laterality Date  . ABDOMINAL AORTOGRAM W/LOWER EXTREMITY N/A 11/15/2017   Procedure: ABDOMINAL AORTOGRAM W/LOWER EXTREMITY;  Surgeon: [name], MD;  Location: [facility];  Service: Cardiovascular;  Laterality: N/A;  . Adenosine Myoview  3/06   EF 56%, neg. Ischemia  . Adenosine Myoview  02/18/07   nml  . AMPUTATION Left 12/15/2017   Procedure: AMPUTATION BELOW KNEE;  Surgeon: Algernon [name], MD;  Location: [facility];  Service: Vascular;  Laterality: Left;  . ANGIOPLASTY  1/99   CAD- diogonal with rotational artherectomy  . Arthrectomy     of LAD & PTCA  . BLEPHAROPLASTY Bilateral   . CARDIAC CATHETERIZATION  1/00  . CARDIOVERSION  1/04  . CARDIOVERSION  5/07   hospital- a flutter  . CARPAL TUNNEL RELEASE     ? bilateral  . CATARACT EXTRACTION W/PHACO Right 07/28/2017   Procedure: CATARACT EXTRACTION PHACO AND INTRAOCULAR LENS PLACEMENT ([place]) RIGHT DIABETIC;  Surgeon: [name], MD;  Location: [facility];  Service: Ophthalmology;  Laterality: Right;  Diabetic - diet controlled  . CATARACT EXTRACTION W/PHACO Left 08/18/2017   Procedure: CATARACT EXTRACTION PHACO AND INTRAOCULAR LENS PLACEMENT (IOC);  Surgeon: [name], MD;  Location: [facility];  Service: Ophthalmology;  Laterality: Left;  DIABETES - oral meds  . COLONOSCOPY W/ BIOPSIES  10/01/06   sigmoid polyp bx neg, 3 years  . CORONARY ANGIOPLASTY  4/03   cutting balloon PTCA pLAD into Diag  . CORONARY ARTERY BYPASS GRAFT  2000   LIMA-LAD, SVG-RCA, SVG-Diag; SVG-Diag & SVG-RCA occluded 2003  . FRACTURE SURGERY    . HAND SURGERY  08/27/09   R thumb procedure wit Scaphoid Gragt and screws, Dr [name]  . LOWER EXTREMITY ANGIOGRAPHY Right 01/25/2019   Procedure: LOWER EXTREMITY ANGIOGRAPHY;  Surgeon: Katha Cabal, MD;  Location: [facility];  Service: Cardiovascular;  Laterality: Right;  . NM MYOVIEW LTD  4/11   normal  . PERIPHERAL VASCULAR CATHETERIZATION Right 06/27/2015   Procedure: Lower Extremity Angiography;  Surgeon: Algernon [name], MD;  Location: [facility];  Service: Cardiovascular;  Laterality: Right;  . PERIPHERAL VASCULAR CATHETERIZATION  06/27/2015   Procedure: Lower Extremity Intervention;  Surgeon: Algernon [name], MD;  Location: [facility];  Service: Cardiovascular;;   Current Outpatient Medications on File Prior  to Visit  Medication Sig Dispense Refill  . acetaminophen (TYLENOL) 325 MG tablet Take 2 tablets (650 mg total) by mouth every 6 (six) hours as needed for mild pain (or temp >/= 101 F).    [name] amLODipine (NORVASC) 10 MG tablet Take 1 tablet by mouth once daily 90 tablet 3  . B Complex Vitamins (B COMPLEX-B12) TABS Take 1 tablet by mouth daily.     . cholecalciferol (VITAMIN D) 1000 UNITS tablet Take 1,000 Units by mouth 2 (two) times a week. Takes on Mon and Fri    . Cinnamon 500 MG capsule Take 1,000 mg by mouth 2 (two) times daily.     . ferrous sulfate 325 (65 FE) MG EC tablet Take 325 mg by mouth 3 (three) times daily with meals.    . finasteride (PROSCAR) 5 MG tablet TAKE 1 TABLET BY MOUTH ONCE DAILY 90 tablet 3  . fish oil-omega-3 fatty acids 1000 MG capsule Take 1,000 mg by mouth daily.     . furosemide (LASIX) 40 MG tablet Take 1 tablet (40 mg total) by mouth daily as needed (leg swelling). 30 tablet 0  . lisinopril (PRINIVIL,ZESTRIL) 20 MG tablet Take 1 tablet (20 mg total) by mouth daily. 30 tablet 0  . Multiple Vitamin (DAILY MULTIVITAMIN PO) Take 1 tablet by mouth daily.      [name] ULTRA test strip USE 1 STRIP TO CHECK GLUCOSE ONCE DAILY 50 each 5  . pentoxifylline (TRENTAL) 400 MG CR tablet TAKE 1 TABLET BY MOUTH THREE TIMES DAILY WITH MEALS (Patient taking differently: Per patient he is only taking it every 12 hrs.) 90 tablet 3  .  rosuvastatin (CRESTOR) 40 MG tablet Take 1 tablet by mouth once daily 90 tablet 3  . saccharomyces boulardii (FLORASTOR) 250 MG capsule Take 1 capsule (250 mg total) by mouth daily. 90 capsule 3  . tamsulosin (FLOMAX) 0.4 MG CAPS capsule Take 1 capsule by mouth once daily 90 capsule 3  . apixaban (ELIQUIS) 5 MG TABS tablet Take 1 tablet (5 mg total) by mouth 2 (two) times daily. 60 tablet 1  . hydrALAZINE (APRESOLINE) 25 MG tablet Take 3 tablets (75 mg total) by mouth every 8 (eight) hours. (Patient taking differently: Take 75 mg by mouth 2 (two) times daily. ) 270 tablet 3   No current facility-administered medications on file prior to visit.   Allergies  Allergen Reactions  . Isosorbide Mononitrate Other (See Comments)    Unknown, doesn't remember   . Ramipril Cough and Other (See Comments)  . Quinidine Gluconate Rash  . Quinidine Gluconate Other (See Comments) and Rash   Social History   Socioeconomic History  . Marital status: Married    Spouse name: Not on file  . Number of children: 5  . Years of education: Not on file  . Highest education level: Not on file  Occupational History  . Occupation: [employer]    Employer: RETIRED  Tobacco Use  . Smoking status: Never Smoker  . Smokeless tobacco: Former Network engineer and Sexual Activity  . Alcohol use: No  . Drug use: No  . Sexual activity: Never  Other Topics Concern  . Not on file  Social History Narrative   Retired now does some antiques   Retired from Theatre manager and dye work   Married 1951   5 kids   Social Determinants of Radio broadcast assistant Strain:   . Difficulty of Paying Living Expenses: Not on  file  Food Insecurity:   . Worried About Charity fundraiser in the Last Year: Not on file  . Ran Out of Food in the Last Year: Not on file  Transportation Needs:   . Lack of Transportation (Medical): Not on file  . Lack of Transportation (Non-Medical): Not on file  Physical Activity:   . Days of Exercise  per Week: Not on file  . Minutes of Exercise per Session: Not on file  Stress:   . Feeling of Stress : Not on file  Social Connections:   . Frequency of Communication with Friends and Family: Not on file  . Frequency of Social Gatherings with Friends and Family: Not on file  . Attends Religious Services: Not on file  . Active Member of Clubs or Organizations: Not on file  . Attends Archivist Meetings: Not on file  . Marital Status: Not on file  Intimate Partner Violence:   . Fear of Current or Ex-Partner: Not on file  . Emotionally Abused: Not on file  . Physically Abused: Not on file  . Sexually Abused: Not on file      Review of Systems  All other systems reviewed and are negative.      Objective:   Physical Exam Vitals reviewed.  Constitutional:      Appearance: Normal appearance.  Cardiovascular:     Rate and Rhythm: Normal rate and regular rhythm.     Pulses: Normal pulses.     Heart sounds: Normal heart sounds.  Pulmonary:     Effort: Pulmonary effort is normal.     Breath sounds: Normal breath sounds. No wheezing, rhonchi or rales.  Abdominal:     General: Abdomen is flat.     Palpations: Abdomen is soft.  Musculoskeletal:     Right lower leg: Edema present.  Neurological:     Mental Status: He is alert.           Assessment & Plan:  Leg swelling - Plan: BASIC METABOLIC PANEL WITH GFR  Leg swelling is much better.  However patient already had an elevated creatinine prior to starting the Lasix.  I suspect that it will be slightly worse.  Therefore I recommended that he discontinue Lasix and will use this sparingly only as needed for swelling.  Instead I encouraged the patient to stop eating so much salt as this is most likely what caused the swelling.  If he experiences the swelling again after discontinuing the Lasix, we may need to consider stopping the amlodipine as this may be contributing.

## 2019-07-29 LAB — BASIC METABOLIC PANEL WITH GFR
BUN/Creatinine Ratio: 16 (calc) (ref 6–22)
BUN: 29 mg/dL — ABNORMAL HIGH (ref 7–25)
CO2: 23 mmol/L (ref 20–32)
Calcium: 9.3 mg/dL (ref 8.6–10.3)
Chloride: 106 mmol/L (ref 98–110)
Creat: 1.79 mg/dL — ABNORMAL HIGH (ref 0.70–1.11)
GFR, Est African American: 38 mL/min/{1.73_m2} — ABNORMAL LOW (ref >=60)
GFR, Est Non African American: 33 mL/min/{1.73_m2} — ABNORMAL LOW (ref >=60)
Glucose, Bld: 124 mg/dL — ABNORMAL HIGH (ref 65–99)
Potassium: 5.1 mmol/L (ref 3.5–5.3)
Sodium: 139 mmol/L (ref 135–146)

## 2019-07-31 ENCOUNTER — Other Ambulatory Visit: Payer: Self-pay | Admitting: Family Medicine

## 2019-07-31 DIAGNOSIS — R944 Abnormal results of kidney function studies: Secondary | ICD-10-CM

## 2019-08-01 ENCOUNTER — Other Ambulatory Visit: Payer: Self-pay

## 2019-08-01 ENCOUNTER — Other Ambulatory Visit: Payer: PPO

## 2019-08-01 DIAGNOSIS — R944 Abnormal results of kidney function studies: Secondary | ICD-10-CM

## 2019-08-02 LAB — BASIC METABOLIC PANEL
BUN/Creatinine Ratio: 24 (calc) — ABNORMAL HIGH (ref 6–22)
BUN: 36 mg/dL — ABNORMAL HIGH (ref 7–25)
CO2: 22 mmol/L (ref 20–32)
Calcium: 8.8 mg/dL (ref 8.6–10.3)
Chloride: 108 mmol/L (ref 98–110)
Creat: 1.5 mg/dL — ABNORMAL HIGH (ref 0.70–1.11)
Glucose, Bld: 103 mg/dL — ABNORMAL HIGH (ref 65–99)
Potassium: 5.1 mmol/L (ref 3.5–5.3)
Sodium: 140 mmol/L (ref 135–146)

## 2019-08-14 ENCOUNTER — Encounter: Payer: Self-pay | Admitting: Family Medicine

## 2019-08-21 DIAGNOSIS — R310 Gross hematuria: Secondary | ICD-10-CM | POA: Diagnosis not present

## 2019-08-21 DIAGNOSIS — N323 Diverticulum of bladder: Secondary | ICD-10-CM | POA: Diagnosis not present

## 2019-08-21 DIAGNOSIS — N3020 Other chronic cystitis without hematuria: Secondary | ICD-10-CM | POA: Diagnosis not present

## 2019-08-24 ENCOUNTER — Other Ambulatory Visit: Payer: Self-pay | Admitting: Family Medicine

## 2019-08-29 DIAGNOSIS — Z0184 Encounter for antibody response examination: Secondary | ICD-10-CM | POA: Diagnosis not present

## 2019-09-13 ENCOUNTER — Encounter: Payer: Self-pay | Admitting: Family Medicine

## 2019-09-15 ENCOUNTER — Encounter: Payer: Self-pay | Admitting: Family Medicine

## 2019-09-15 MED ORDER — APIXABAN 5 MG PO TABS: 5.0000 mg | ORAL_TABLET | Freq: Two times a day (BID) | ORAL | 3 refills | Status: DC

## 2019-09-15 MED ORDER — HYDRALAZINE HCL 25 MG PO TABS: 75.0000 mg | ORAL_TABLET | Freq: Three times a day (TID) | ORAL | 3 refills | Status: DC

## 2019-10-05 ENCOUNTER — Encounter: Payer: Self-pay | Admitting: Family Medicine

## 2019-10-05 ENCOUNTER — Other Ambulatory Visit: Payer: Self-pay

## 2019-10-06 ENCOUNTER — Ambulatory Visit (INDEPENDENT_AMBULATORY_CARE_PROVIDER_SITE_OTHER): Payer: PPO | Admitting: Family Medicine

## 2019-10-06 ENCOUNTER — Other Ambulatory Visit: Payer: Self-pay

## 2019-10-06 ENCOUNTER — Encounter: Payer: Self-pay | Admitting: Family Medicine

## 2019-10-06 VITALS — BP 130/60 | HR 60 | Temp 97.1°F | Resp 14 | Ht 65.0 in

## 2019-10-06 DIAGNOSIS — M7989 Other specified soft tissue disorders: Secondary | ICD-10-CM

## 2019-10-06 NOTE — Progress Notes (Signed)
Subjective:    Patient ID: Eric [name]., male    DOB: 1927-11-03, 86 y.o.   MRN: [id number]  HPI Initially saw the patient in December for swelling in his right leg.  Gave the patient Lasix to be used as needed.  Swelling improved after just a few days.  Patient presents today with again swelling in his right leg distal to his knee.  The swelling is +1 to +2 in the lower ankle and +1 near the knee.  Swelling does not extend above the knee.  He has palpable dorsalis pedis and posterior tibialis pulses in the foot.  There is a surgical scar along his medial shin due to previous surgery for peripheral vascular disease.  There is no erythema or evidence of cellulitis.  He is taking Eliquis for A. fib and therefore I do not believe the patient has a DVT.  Lab work in December showed no baseline potassium greater than 5 and therefore I do not feel that the patient requires potassium supplementation with diuretic. Past Medical History:  Diagnosis Date  . Atrial flutter ([place])    s/p RFCA  . CAD (coronary artery disease)    cath 2003, occluded S-RCA, occluded S-Dx, L-LAD ok, s/p PTCA to LAD  . Cataract   . CKD (chronic kidney disease) stage 3, GFR 30-59 ml/min   . Diabetes mellitus    diet controlled  . Hearing aid worn    bilateral  . History of kidney stones   . Long term (current) use of anticoagulants   . Neuromuscular disorder ([place])   . OSA (obstructive sleep apnea) 12/11   very mild, AHI 7/hr  . Persistent atrial fibrillation ([place]) 09/26/2015  . Pure hyperglyceridemia   . PVD (peripheral vascular disease) ([place])    angioplasty of his right lower extremity in [place] by Dr.[name] 2013  . Unspecified essential hypertension   . Wears dentures    full upper and lower   Past Surgical History:  Procedure Laterality Date  . ABDOMINAL AORTOGRAM W/LOWER EXTREMITY N/A 11/15/2017   Procedure: ABDOMINAL AORTOGRAM W/LOWER EXTREMITY;  Surgeon: [name], MD;  Location: [facility];  Service: Cardiovascular;  Laterality: N/A;  . Adenosine Myoview  3/06   EF 56%, neg. Ischemia  . Adenosine Myoview  02/18/07   nml  . AMPUTATION Left 12/15/2017   Procedure: AMPUTATION BELOW KNEE;  Surgeon: Algernon [name], MD;  Location: [facility];  Service: Vascular;  Laterality: Left;  . ANGIOPLASTY  1/99   CAD- diogonal with rotational artherectomy  . Arthrectomy     of LAD & PTCA  . BLEPHAROPLASTY Bilateral   . CARDIAC CATHETERIZATION  1/00  . CARDIOVERSION  1/04  . CARDIOVERSION  5/07   hospital- a flutter  . CARPAL TUNNEL RELEASE     ? bilateral  . CATARACT EXTRACTION W/PHACO Right 07/28/2017   Procedure: CATARACT EXTRACTION PHACO AND INTRAOCULAR LENS PLACEMENT ([place]) RIGHT DIABETIC;  Surgeon: [name], MD;  Location: [facility];  Service: Ophthalmology;  Laterality: Right;  Diabetic - diet controlled  . CATARACT EXTRACTION W/PHACO Left 08/18/2017   Procedure: CATARACT EXTRACTION PHACO AND INTRAOCULAR LENS PLACEMENT ([facility]);  Surgeon: [name], MD;  Location: [facility];  Service: Ophthalmology;  Laterality: Left;  DIABETES - oral meds  . COLONOSCOPY W/ BIOPSIES  10/01/06   sigmoid polyp bx neg, 3 years  . CORONARY ANGIOPLASTY  4/03   cutting balloon PTCA pLAD into Diag  . CORONARY ARTERY BYPASS  GRAFT  2000   LIMA-LAD, SVG-RCA, SVG-Diag; SVG-Diag & SVG-RCA occluded 2003  . FRACTURE SURGERY    . HAND SURGERY  08/27/09   R thumb procedure wit Scaphoid Gragt and screws, Dr [name]  . LOWER EXTREMITY ANGIOGRAPHY Right 01/25/2019   Procedure: LOWER EXTREMITY ANGIOGRAPHY;  Surgeon: Katha Cabal, MD;  Location: [facility];  Service: Cardiovascular;  Laterality: Right;  . NM MYOVIEW LTD  4/11   normal  . PERIPHERAL VASCULAR CATHETERIZATION Right 06/27/2015   Procedure: Lower Extremity Angiography;  Surgeon: Algernon [name], MD;  Location: [facility];  Service: Cardiovascular;  Laterality: Right;  . PERIPHERAL VASCULAR  CATHETERIZATION  06/27/2015   Procedure: Lower Extremity Intervention;  Surgeon: Algernon [name], MD;  Location: [facility];  Service: Cardiovascular;;   Current Outpatient Medications on File Prior to Visit  Medication Sig Dispense Refill  . acetaminophen (TYLENOL) 325 MG tablet Take 2 tablets (650 mg total) by mouth every 6 (six) hours as needed for mild pain (or temp >/= 101 F).    [name] amLODipine (NORVASC) 10 MG tablet Take 1 tablet by mouth once daily 90 tablet 3  . apixaban (ELIQUIS) 5 MG TABS tablet Take 1 tablet (5 mg total) by mouth 2 (two) times daily. 180 tablet 3  . B Complex Vitamins (B COMPLEX-B12) TABS Take 1 tablet by mouth daily.     . cholecalciferol (VITAMIN D) 1000 UNITS tablet Take 1,000 Units by mouth 2 (two) times a week. Takes on Mon and Fri    . Cinnamon 500 MG capsule Take 1,000 mg by mouth 2 (two) times daily.     . ferrous sulfate 325 (65 FE) MG EC tablet Take 325 mg by mouth 3 (three) times daily with meals.    . finasteride (PROSCAR) 5 MG tablet TAKE 1 TABLET BY MOUTH ONCE DAILY 90 tablet 3  . fish oil-omega-3 fatty acids 1000 MG capsule Take 1,000 mg by mouth daily.     . furosemide (LASIX) 40 MG tablet Take 1 tablet (40 mg total) by mouth daily as needed (leg swelling). 30 tablet 0  . hydrALAZINE (APRESOLINE) 25 MG tablet Take 3 tablets (75 mg total) by mouth every 8 (eight) hours. 270 tablet 3  . lisinopril (PRINIVIL,ZESTRIL) 20 MG tablet Take 1 tablet (20 mg total) by mouth daily. 30 tablet 0  . Multiple Vitamin (DAILY MULTIVITAMIN PO) Take 1 tablet by mouth daily.      [name] ULTRA test strip USE 1 STRIP TO CHECK GLUCOSE ONCE DAILY 50 each 5  . pentoxifylline (TRENTAL) 400 MG CR tablet Per patient he is only taking it every 12 hrs. 90 tablet 2  . rosuvastatin (CRESTOR) 40 MG tablet Take 1 tablet by mouth once daily 90 tablet 3  . saccharomyces boulardii (FLORASTOR) 250 MG capsule Take 1 capsule (250 mg total) by mouth daily. 90 capsule 3  . tamsulosin  (FLOMAX) 0.4 MG CAPS capsule Take 1 capsule by mouth once daily 90 capsule 3   No current facility-administered medications on file prior to visit.   Allergies  Allergen Reactions  . Isosorbide Mononitrate Other (See Comments)    Unknown, doesn't remember   . Ramipril Cough and Other (See Comments)  . Quinidine Gluconate Rash  . Quinidine Gluconate Other (See Comments) and Rash   Social History   Socioeconomic History  . Marital status: Married    Spouse name: Not on file  . Number of children: 5  . Years  of education: Not on file  . Highest education level: Not on file  Occupational History  . Occupation: [employer]    Employer: RETIRED  Tobacco Use  . Smoking status: Never Smoker  . Smokeless tobacco: Former Network engineer and Sexual Activity  . Alcohol use: No  . Drug use: No  . Sexual activity: Never  Other Topics Concern  . Not on file  Social History Narrative   Retired now does some antiques   Retired from Theatre manager and dye work   Married 1951   5 kids   Social Determinants of Radio broadcast assistant Strain:   . Difficulty of Paying Living Expenses: Not on file  Food Insecurity:   . Worried About Charity fundraiser in the Last Year: Not on file  . Ran Out of Food in the Last Year: Not on file  Transportation Needs:   . Lack of Transportation (Medical): Not on file  . Lack of Transportation (Non-Medical): Not on file  Physical Activity:   . Days of Exercise per Week: Not on file  . Minutes of Exercise per Session: Not on file  Stress:   . Feeling of Stress : Not on file  Social Connections:   . Frequency of Communication with Friends and Family: Not on file  . Frequency of Social Gatherings with Friends and Family: Not on file  . Attends Religious Services: Not on file  . Active Member of Clubs or Organizations: Not on file  . Attends Archivist Meetings: Not on file  . Marital Status: Not on file  Intimate Partner Violence:   . Fear  of Current or Ex-Partner: Not on file  . Emotionally Abused: Not on file  . Physically Abused: Not on file  . Sexually Abused: Not on file      Review of Systems  All other systems reviewed and are negative.      Objective:   Physical Exam Vitals reviewed.  Constitutional:      Appearance: Normal appearance.  Cardiovascular:     Rate and Rhythm: Normal rate and regular rhythm.     Pulses: Normal pulses.     Heart sounds: Normal heart sounds.  Pulmonary:     Effort: Pulmonary effort is normal.     Breath sounds: Normal breath sounds. No wheezing, rhonchi or rales.  Abdominal:     General: Abdomen is flat.     Palpations: Abdomen is soft.  Musculoskeletal:     Right lower leg: Edema present.  Neurological:     Mental Status: He is alert.           Assessment & Plan:  Leg swelling  For the next 3 days I would like the patient to use Lasix 80 mg in the morning.  He has tried 40 mg daily for the last 3 days with no success.  Recheck on Monday.  Discontinue amlodipine as this is only likely adding to the issue with his leg swelling.[name]

## 2019-10-10 ENCOUNTER — Encounter: Payer: Self-pay | Admitting: Family Medicine

## 2019-10-12 ENCOUNTER — Encounter: Payer: Self-pay | Admitting: Family Medicine

## 2019-10-12 ENCOUNTER — Ambulatory Visit (INDEPENDENT_AMBULATORY_CARE_PROVIDER_SITE_OTHER): Payer: PPO | Admitting: Family Medicine

## 2019-10-12 ENCOUNTER — Other Ambulatory Visit: Payer: Self-pay

## 2019-10-12 VITALS — BP 126/58 | HR 70 | Temp 98.2°F | Resp 18 | Ht 65.0 in

## 2019-10-12 DIAGNOSIS — M7989 Other specified soft tissue disorders: Secondary | ICD-10-CM | POA: Diagnosis not present

## 2019-10-12 NOTE — Progress Notes (Signed)
Subjective:    Patient ID: Eric [name]., male    DOB: 1928-04-08, 86 y.o.   MRN: [id number]  HPI  At the patient's last visit, we stopped his amlodipine.  We also started the patient on Lasix 80 mg a day.  The swelling in his right leg has improved dramatically.  Patient denies any chest pain or shortness of breath or dyspnea on exertion.  Despite stopping the amlodipine, his blood pressure still well controlled at 126/58.  The patient has decreased the Lasix to 40 mg once a day.  However he is still taking the diuretic. Past Medical History:  Diagnosis Date  . Atrial flutter ([place])    s/p RFCA  . CAD (coronary artery disease)    cath 2003, occluded S-RCA, occluded S-Dx, L-LAD ok, s/p PTCA to LAD  . Cataract   . CKD (chronic kidney disease) stage 3, GFR 30-59 ml/min   . Diabetes mellitus    diet controlled  . Hearing aid worn    bilateral  . History of kidney stones   . Long term (current) use of anticoagulants   . Neuromuscular disorder ([place])   . OSA (obstructive sleep apnea) 12/11   very mild, AHI 7/hr  . Persistent atrial fibrillation ([place]) 09/26/2015  . Pure hyperglyceridemia   . PVD (peripheral vascular disease) ([place])    angioplasty of his right lower extremity in [place] by Dr.[name] 2013  . Unspecified essential hypertension   . Wears dentures    full upper and lower   Past Surgical History:  Procedure Laterality Date  . ABDOMINAL AORTOGRAM W/LOWER EXTREMITY N/A 11/15/2017   Procedure: ABDOMINAL AORTOGRAM W/LOWER EXTREMITY;  Surgeon: [name], MD;  Location: [facility];  Service: Cardiovascular;  Laterality: N/A;  . Adenosine Myoview  3/06   EF 56%, neg. Ischemia  . Adenosine Myoview  02/18/07   nml  . AMPUTATION Left 12/15/2017   Procedure: AMPUTATION BELOW KNEE;  Surgeon: Algernon [name], MD;  Location: [facility];  Service: Vascular;  Laterality: Left;  . ANGIOPLASTY  1/99   CAD- diogonal with rotational artherectomy  . Arthrectomy     of LAD & PTCA   . BLEPHAROPLASTY Bilateral   . CARDIAC CATHETERIZATION  1/00  . CARDIOVERSION  1/04  . CARDIOVERSION  5/07   hospital- a flutter  . CARPAL TUNNEL RELEASE     ? bilateral  . CATARACT EXTRACTION W/PHACO Right 07/28/2017   Procedure: CATARACT EXTRACTION PHACO AND INTRAOCULAR LENS PLACEMENT ([place]) RIGHT DIABETIC;  Surgeon: [name], MD;  Location: [facility];  Service: Ophthalmology;  Laterality: Right;  Diabetic - diet controlled  . CATARACT EXTRACTION W/PHACO Left 08/18/2017   Procedure: CATARACT EXTRACTION PHACO AND INTRAOCULAR LENS PLACEMENT (IOC);  Surgeon: [name], MD;  Location: [facility];  Service: Ophthalmology;  Laterality: Left;  DIABETES - oral meds  . COLONOSCOPY W/ BIOPSIES  10/01/06   sigmoid polyp bx neg, 3 years  . CORONARY ANGIOPLASTY  4/03   cutting balloon PTCA pLAD into Diag  . CORONARY ARTERY BYPASS GRAFT  2000   LIMA-LAD, SVG-RCA, SVG-Diag; SVG-Diag & SVG-RCA occluded 2003  . FRACTURE SURGERY    . HAND SURGERY  08/27/09   R thumb procedure wit Scaphoid Gragt and screws, Dr [name]  . LOWER EXTREMITY ANGIOGRAPHY Right 01/25/2019   Procedure: LOWER EXTREMITY ANGIOGRAPHY;  Surgeon: Katha Cabal, MD;  Location: [facility];  Service: Cardiovascular;  Laterality: Right;  . NM MYOVIEW LTD  4/11   normal  . PERIPHERAL VASCULAR CATHETERIZATION Right 06/27/2015   Procedure: Lower Extremity Angiography;  Surgeon: Algernon [name], MD;  Location: [facility];  Service: Cardiovascular;  Laterality: Right;  . PERIPHERAL VASCULAR CATHETERIZATION  06/27/2015   Procedure: Lower Extremity Intervention;  Surgeon: Algernon [name], MD;  Location: [facility];  Service: Cardiovascular;;   Current Outpatient Medications on File Prior to Visit  Medication Sig Dispense Refill  . acetaminophen (TYLENOL) 325 MG tablet Take 2 tablets (650 mg total) by mouth every 6 (six) hours as needed for mild pain (or temp >/= 101 F).    [name]  apixaban (ELIQUIS) 5 MG TABS tablet Take 1 tablet (5 mg total) by mouth 2 (two) times daily. 180 tablet 3  . B Complex Vitamins (B COMPLEX-B12) TABS Take 1 tablet by mouth daily.     . cholecalciferol (VITAMIN D) 1000 UNITS tablet Take 1,000 Units by mouth 2 (two) times a week. Takes on Mon and Fri    . Cinnamon 500 MG capsule Take 1,000 mg by mouth 2 (two) times daily.     . ferrous sulfate 325 (65 FE) MG EC tablet Take 325 mg by mouth 3 (three) times daily with meals.    . finasteride (PROSCAR) 5 MG tablet TAKE 1 TABLET BY MOUTH ONCE DAILY 90 tablet 3  . fish oil-omega-3 fatty acids 1000 MG capsule Take 1,000 mg by mouth daily.     . furosemide (LASIX) 40 MG tablet Take 1 tablet (40 mg total) by mouth daily as needed (leg swelling). 30 tablet 0  . hydrALAZINE (APRESOLINE) 25 MG tablet Take 3 tablets (75 mg total) by mouth every 8 (eight) hours. 270 tablet 3  . lisinopril (PRINIVIL,ZESTRIL) 20 MG tablet Take 1 tablet (20 mg total) by mouth daily. 30 tablet 0  . Multiple Vitamin (DAILY MULTIVITAMIN PO) Take 1 tablet by mouth daily.      [name] ULTRA test strip USE 1 STRIP TO CHECK GLUCOSE ONCE DAILY 50 each 5  . pentoxifylline (TRENTAL) 400 MG CR tablet Per patient he is only taking it every 12 hrs. 90 tablet 2  . rosuvastatin (CRESTOR) 40 MG tablet Take 1 tablet by mouth once daily 90 tablet 3  . saccharomyces boulardii (FLORASTOR) 250 MG capsule Take 1 capsule (250 mg total) by mouth daily. 90 capsule 3  . tamsulosin (FLOMAX) 0.4 MG CAPS capsule Take 1 capsule by mouth once daily 90 capsule 3   No current facility-administered medications on file prior to visit.   Allergies  Allergen Reactions  . Isosorbide Mononitrate Other (See Comments)    Unknown, doesn't remember   . Ramipril Cough and Other (See Comments)  . Quinidine Gluconate Rash  . Quinidine Gluconate Other (See Comments) and Rash   Social History   Socioeconomic History  . Marital status: Married    Spouse name: Not on  file  . Number of children: 5  . Years of education: Not on file  . Highest education level: Not on file  Occupational History  . Occupation: [employer]    Employer: RETIRED  Tobacco Use  . Smoking status: Never Smoker  . Smokeless tobacco: Former Network engineer and Sexual Activity  . Alcohol use: No  . Drug use: No  . Sexual activity: Never  Other Topics Concern  . Not on file  Social History Narrative   Retired now does some antiques   Retired from Theatre manager and dye work  Married 1951   5 kids   Social Determinants of Health   Financial Resource Strain:   . Difficulty of Paying Living Expenses: Not on file  Food Insecurity:   . Worried About Charity fundraiser in the Last Year: Not on file  . Ran Out of Food in the Last Year: Not on file  Transportation Needs:   . Lack of Transportation (Medical): Not on file  . Lack of Transportation (Non-Medical): Not on file  Physical Activity:   . Days of Exercise per Week: Not on file  . Minutes of Exercise per Session: Not on file  Stress:   . Feeling of Stress : Not on file  Social Connections:   . Frequency of Communication with Friends and Family: Not on file  . Frequency of Social Gatherings with Friends and Family: Not on file  . Attends Religious Services: Not on file  . Active Member of Clubs or Organizations: Not on file  . Attends Archivist Meetings: Not on file  . Marital Status: Not on file  Intimate Partner Violence:   . Fear of Current or Ex-Partner: Not on file  . Emotionally Abused: Not on file  . Physically Abused: Not on file  . Sexually Abused: Not on file      Review of Systems  All other systems reviewed and are negative.      Objective:   Physical Exam Vitals reviewed.  Constitutional:      Appearance: Normal appearance.  Cardiovascular:     Rate and Rhythm: Normal rate and regular rhythm.     Pulses: Normal pulses.     Heart sounds: Normal heart sounds.  Pulmonary:      Effort: Pulmonary effort is normal.     Breath sounds: Normal breath sounds. No wheezing, rhonchi or rales.  Abdominal:     General: Abdomen is flat.     Palpations: Abdomen is soft.  Musculoskeletal:     Right lower leg: No edema.  Neurological:     Mental Status: He is alert.           Assessment & Plan:  Leg swelling - Plan: BASIC METABOLIC PANEL WITH GFR  Patient can discontinue Lasix now.  There is no evidence of fluid overload on exam.  He will monitor his leg on a daily basis.  If the swelling returns he prefers to take Lasix on an as-needed basis rather than take a low-dose every day.  Hopefully discontinuing the amlodipine will prevent the peripheral edema from reoccurring.  Recheck bmp.

## 2019-10-13 DIAGNOSIS — M7989 Other specified soft tissue disorders: Secondary | ICD-10-CM

## 2019-10-13 LAB — BASIC METABOLIC PANEL WITH GFR
BUN/Creatinine Ratio: 19 (calc) (ref 6–22)
BUN: 33 mg/dL — ABNORMAL HIGH (ref 7–25)
CO2: 25 mmol/L (ref 20–32)
Calcium: 9.6 mg/dL (ref 8.6–10.3)
Chloride: 104 mmol/L (ref 98–110)
Creat: 1.76 mg/dL — ABNORMAL HIGH (ref 0.70–1.11)
GFR, Est African American: 39 mL/min/{1.73_m2} — ABNORMAL LOW (ref >=60)
GFR, Est Non African American: 34 mL/min/{1.73_m2} — ABNORMAL LOW (ref >=60)
Glucose, Bld: 74 mg/dL (ref 65–99)
Potassium: 5.6 mmol/L — ABNORMAL HIGH (ref 3.5–5.3)
Sodium: 138 mmol/L (ref 135–146)

## 2019-10-13 MED ORDER — FUROSEMIDE 40 MG PO TABS: 40.0000 mg | ORAL_TABLET | Freq: Every day | ORAL | 0 refills | Status: DC | PRN

## 2019-11-13 ENCOUNTER — Ambulatory Visit (INDEPENDENT_AMBULATORY_CARE_PROVIDER_SITE_OTHER): Payer: PPO | Admitting: Vascular Surgery

## 2019-11-13 ENCOUNTER — Other Ambulatory Visit: Payer: Self-pay

## 2019-11-13 ENCOUNTER — Encounter (INDEPENDENT_AMBULATORY_CARE_PROVIDER_SITE_OTHER): Payer: Self-pay | Admitting: Vascular Surgery

## 2019-11-13 ENCOUNTER — Ambulatory Visit (INDEPENDENT_AMBULATORY_CARE_PROVIDER_SITE_OTHER): Payer: PPO

## 2019-11-13 VITALS — BP 149/64 | HR 75 | Ht 65.0 in | Wt 169.0 lb

## 2019-11-13 DIAGNOSIS — I7090 Unspecified atherosclerosis: Secondary | ICD-10-CM

## 2019-11-13 DIAGNOSIS — I2510 Atherosclerotic heart disease of native coronary artery without angina pectoris: Secondary | ICD-10-CM

## 2019-11-13 DIAGNOSIS — E1152 Type 2 diabetes mellitus with diabetic peripheral angiopathy with gangrene: Secondary | ICD-10-CM

## 2019-11-13 DIAGNOSIS — I7025 Atherosclerosis of native arteries of other extremities with ulceration: Secondary | ICD-10-CM | POA: Diagnosis not present

## 2019-11-13 DIAGNOSIS — I10 Essential (primary) hypertension: Secondary | ICD-10-CM

## 2019-11-13 DIAGNOSIS — Z89512 Acquired absence of left leg below knee: Secondary | ICD-10-CM | POA: Diagnosis not present

## 2019-11-13 NOTE — Progress Notes (Signed)
MRN : [id number]  Eric [name]. is a 86 y.o. (1928/08/06) male who presents with chief complaint of  Chief Complaint  Patient presents with  . Follow-up    U/S Follow up  .  History of Present Illness:   Atherosclerotic occlusive disease bilateral lower extremities; he is status post left below-knee amputation.  The patient is here for followup. There have been no interval changes in lower extremity symptoms. No interval shortening of the patient's claudication distance or development of rest pain symptoms. No new ulcers or wounds have occurred since the last visit.  There have been no significant changes to the patient's overall health care.  The patient denies amaurosis fugax or recent TIA symptoms. There are no recent neurological changes noted. The patient denies history of DVT, PE or superficial thrombophlebitis. The patient denies recent episodes of angina or shortness of breath.    Current Meds  Medication Sig  . acetaminophen (TYLENOL) 325 MG tablet Take 2 tablets (650 mg total) by mouth every 6 (six) hours as needed for mild pain (or temp >/= 101 F).  . B Complex Vitamins (B COMPLEX-B12) TABS Take 1 tablet by mouth daily.   . cholecalciferol (VITAMIN D) 1000 UNITS tablet Take 1,000 Units by mouth 2 (two) times a week. Takes on Mon and Fri  . Cinnamon 500 MG capsule Take 1,000 mg by mouth 2 (two) times daily.   . ferrous sulfate 325 (65 FE) MG EC tablet Take 325 mg by mouth 3 (three) times daily with meals.  . finasteride (PROSCAR) 5 MG tablet TAKE 1 TABLET BY MOUTH ONCE DAILY  . fish oil-omega-3 fatty acids 1000 MG capsule Take 1,000 mg by mouth daily.   . furosemide (LASIX) 40 MG tablet Take 1 tablet (40 mg total) by mouth daily as needed (leg swelling).  [name] lisinopril (PRINIVIL,ZESTRIL) 20 MG tablet Take 1 tablet (20 mg total) by mouth daily.  . Multiple Vitamin (DAILY MULTIVITAMIN PO) Take 1 tablet by mouth daily.    [name] ULTRA test strip USE 1 STRIP  TO CHECK GLUCOSE ONCE DAILY  . pentoxifylline (TRENTAL) 400 MG CR tablet Per patient he is only taking it every 12 hrs.  . rosuvastatin (CRESTOR) 40 MG tablet Take 1 tablet by mouth once daily  . saccharomyces boulardii (FLORASTOR) 250 MG capsule Take 1 capsule (250 mg total) by mouth daily.  . tamsulosin (FLOMAX) 0.4 MG CAPS capsule Take 1 capsule by mouth once daily    Past Medical History:  Diagnosis Date  . Atrial flutter ([place])    s/p RFCA  . CAD (coronary artery disease)    cath 2003, occluded S-RCA, occluded S-Dx, L-LAD ok, s/p PTCA to LAD  . Cataract   . CKD (chronic kidney disease) stage 3, GFR 30-59 ml/min   . Diabetes mellitus    diet controlled  . Hearing aid worn    bilateral  . History of kidney stones   . Long term (current) use of anticoagulants   . Neuromuscular disorder ([place])   . OSA (obstructive sleep apnea) 12/11   very mild, AHI 7/hr  . Persistent atrial fibrillation ([place]) 09/26/2015  . Pure hyperglyceridemia   . PVD (peripheral vascular disease) ([place])    angioplasty of his right lower extremity in [place] by Dr.[name] 2013  . Unspecified essential hypertension   . Wears dentures    full upper and lower    Past Surgical History:  Procedure Laterality Date  . ABDOMINAL AORTOGRAM W/LOWER EXTREMITY  N/A 11/15/2017   Procedure: ABDOMINAL AORTOGRAM W/LOWER EXTREMITY;  Surgeon: [name], MD;  Location: [facility];  Service: Cardiovascular;  Laterality: N/A;  . Adenosine Myoview  3/06   EF 56%, neg. Ischemia  . Adenosine Myoview  02/18/07   nml  . AMPUTATION Left 12/15/2017   Procedure: AMPUTATION BELOW KNEE;  Surgeon: Algernon [name], MD;  Location: [facility];  Service: Vascular;  Laterality: Left;  . ANGIOPLASTY  1/99   CAD- diogonal with rotational artherectomy  . Arthrectomy     of LAD & PTCA  . BLEPHAROPLASTY Bilateral   . CARDIAC CATHETERIZATION  1/00  . CARDIOVERSION  1/04  . CARDIOVERSION  5/07   hospital- a flutter  . CARPAL TUNNEL  RELEASE     ? bilateral  . CATARACT EXTRACTION W/PHACO Right 07/28/2017   Procedure: CATARACT EXTRACTION PHACO AND INTRAOCULAR LENS PLACEMENT ([place]) RIGHT DIABETIC;  Surgeon: [name], MD;  Location: [facility];  Service: Ophthalmology;  Laterality: Right;  Diabetic - diet controlled  . CATARACT EXTRACTION W/PHACO Left 08/18/2017   Procedure: CATARACT EXTRACTION PHACO AND INTRAOCULAR LENS PLACEMENT ([facility]);  Surgeon: [name], MD;  Location: [facility];  Service: Ophthalmology;  Laterality: Left;  DIABETES - oral meds  . COLONOSCOPY W/ BIOPSIES  10/01/06   sigmoid polyp bx neg, 3 years  . CORONARY ANGIOPLASTY  4/03   cutting balloon PTCA pLAD into Diag  . CORONARY ARTERY BYPASS GRAFT  2000   LIMA-LAD, SVG-RCA, SVG-Diag; SVG-Diag & SVG-RCA occluded 2003  . FRACTURE SURGERY    . HAND SURGERY  08/27/09   R thumb procedure wit Scaphoid Gragt and screws, Dr [name]  . LOWER EXTREMITY ANGIOGRAPHY Right 01/25/2019   Procedure: LOWER EXTREMITY ANGIOGRAPHY;  Surgeon: Katha Cabal, MD;  Location: [facility];  Service: Cardiovascular;  Laterality: Right;  . NM MYOVIEW LTD  4/11   normal  . PERIPHERAL VASCULAR CATHETERIZATION Right 06/27/2015   Procedure: Lower Extremity Angiography;  Surgeon: Algernon [name], MD;  Location: [facility];  Service: Cardiovascular;  Laterality: Right;  . PERIPHERAL VASCULAR CATHETERIZATION  06/27/2015   Procedure: Lower Extremity Intervention;  Surgeon: Algernon [name], MD;  Location: [facility];  Service: Cardiovascular;;    Social History Social History   Tobacco Use  . Smoking status: Never Smoker  . Smokeless tobacco: Former Network engineer Use Topics  . Alcohol use: No  . Drug use: No    Family History Family History  Problem Relation Age of Onset  . Stroke Father   . Aneurysm Father   . Hypertension Father   . Prostate cancer Brother   . Hypertension Mother   . Lung cancer Brother         smoker  . Thrombosis Brother   . Other Brother        RF valve disorder (smoker)  . Lung cancer Brother        smoker  . Breast cancer Sister   . Liver cancer Brother   . Other Sister        cerebral hemorrhage    Allergies  Allergen Reactions  . Isosorbide Mononitrate Other (See Comments)    Unknown, doesn't remember   . Ramipril Cough and Other (See Comments)  . Quinidine Gluconate Rash  . Quinidine Gluconate Other (See Comments) and Rash     REVIEW OF SYSTEMS (Negative unless checked)  Constitutional: [] Weight loss  [] Fever  [] Chills Cardiac: [] Chest pain   []   Chest pressure   [] Palpitations   [] Shortness of breath when laying flat   [] Shortness of breath with exertion. Vascular:  [] Pain in legs with walking   [] Pain in legs at rest  [] History of DVT   [] Phlebitis   [] Swelling in legs   [] Varicose veins   [] Non-healing ulcers Pulmonary:   [] Uses home oxygen   [] Productive cough   [] Hemoptysis   [] Wheeze  [] COPD   [] Asthma Neurologic:  [] Dizziness   [] Seizures   [] History of stroke   [] History of TIA  [] Aphasia   [] Vissual changes   [] Weakness or numbness in arm   [] Weakness or numbness in leg Musculoskeletal:   [] Joint swelling   [] Joint pain   [] Low back pain Hematologic:  [] Easy bruising  [] Easy bleeding   [] Hypercoagulable state   [] Anemic Gastrointestinal:  [] Diarrhea   [] Vomiting  [] Gastroesophageal reflux/heartburn   [] Difficulty swallowing. Genitourinary:  [] Chronic kidney disease   [] Difficult urination  [] Frequent urination   [] Blood in urine Skin:  [] Rashes   [] Ulcers  Psychological:  [] History of anxiety   []  History of major depression.  Physical Examination  Vitals:   11/13/19 1010  BP: (!) 149/64  Pulse: 75  Weight: 169 lb (76.7 kg)  Height: 5\' 5"  (1.651 m)   Body mass index is 28.12 kg/m. Gen: WD/WN, NAD Head: [place]/AT, No temporalis wasting.  Ear/Nose/Throat: Hearing grossly intact, nares w/o erythema or drainage Eyes: PER, EOMI, sclera nonicteric.    Neck: Supple, no large masses.   Pulmonary:  Good air movement, no audible wheezing bilaterally, no use of accessory muscles.  Cardiac: RRR, no JVD Vascular: right foot intact no wounds; left BKA Vessel Right Left  Radial Palpable Palpable  PT Not Palpable BKA  DP Not Palpable BKA  Gastrointestinal: Non-distended. No guarding/no peritoneal signs.  Musculoskeletal: M/S 5/5 throughout.  Left BKA.  Neurologic: CN 2-12 intact. Symmetrical.  Speech is fluent. Motor exam as listed above. Psychiatric: Judgment intact, Mood & affect appropriate for pt's clinical situation. Dermatologic: No rashes or ulcers noted.  No changes consistent with cellulitis.   CBC Lab Results  Component Value Date   WBC 10.9 (H) 05/03/2019   HGB 11.5 (L) 05/03/2019   HCT 34.6 (L) 05/03/2019   MCV 92.8 05/03/2019   PLT 201 05/03/2019    BMET    Component Value Date/Time   NA 138 10/12/2019 1444   NA 139 04/28/2012 0658   K 5.6 (H) 10/12/2019 1444   K 4.2 04/28/2012 0658   CL 104 10/12/2019 1444   CL 106 04/28/2012 0658   CO2 25 10/12/2019 1444   CO2 25 04/28/2012 0658   GLUCOSE 74 10/12/2019 1444   GLUCOSE 114 (H) 04/28/2012 0658   BUN 33 (H) 10/12/2019 1444   BUN 32 (H) 04/28/2012 0658   CREATININE 1.76 (H) 10/12/2019 1444   CALCIUM 9.6 10/12/2019 1444   CALCIUM 8.9 04/28/2012 0658   GFRNONAA 34 (L) 10/12/2019 1444   GFRAA 39 (L) 10/12/2019 1444   CrCl cannot be calculated (Patient's most recent lab result is older than the maximum 21 days allowed.).  COAG Lab Results  Component Value Date   INR 1.11 12/15/2017   INR 1.46 12/13/2017   INR 1.27 11/15/2017   PROTIME 19.9 01/10/2009    Radiology No results found.   Assessment/Plan 1. ASO (arteriosclerosis obliterans)  Recommend:  The patient has evidence of atherosclerosis of the lower extremities with claudication.  The patient does not voice lifestyle limiting changes at this point in time.  Noninvasive studies do not suggest  clinically significant change.  No invasive studies, angiography or surgery at this time The patient should continue walking and begin a more formal exercise program.  The patient should continue antiplatelet therapy and aggressive treatment of the lipid abnormalities  No changes in the patient's medications at this time  The patient should continue wearing graduated compression socks 10-15 mmHg strength to control the mild edema.   - VAS [place] ABI WITH/WO TBI; Future  2. Hx of BKA, left ([place]) Prosthesis working well  3. Essential hypertension Continue antihypertensive medications as already ordered, these medications have been reviewed and there are no changes at this time.   4. Chronic coronary artery disease Continue cardiac and antihypertensive medications as already ordered and reviewed, no changes at this time.  Continue statin as ordered and reviewed, no changes at this time  Nitrates PRN for chest pain   5. Type 2 diabetes mellitus with diabetic peripheral angiopathy with gangrene, unspecified whether long term insulin use (HCC) Continue hypoglycemic medications as already ordered, these medications have been reviewed and there are no changes at this time.  Hgb A1C to be monitored as already arranged by primary service     Hortencia Pilar, MD  11/13/2019 10:14 AM

## 2019-12-12 DIAGNOSIS — C44319 Basal cell carcinoma of skin of other parts of face: Secondary | ICD-10-CM | POA: Diagnosis not present

## 2019-12-12 DIAGNOSIS — D485 Neoplasm of uncertain behavior of skin: Secondary | ICD-10-CM | POA: Diagnosis not present

## 2019-12-12 DIAGNOSIS — L821 Other seborrheic keratosis: Secondary | ICD-10-CM | POA: Diagnosis not present

## 2019-12-12 DIAGNOSIS — C44622 Squamous cell carcinoma of skin of right upper limb, including shoulder: Secondary | ICD-10-CM | POA: Diagnosis not present

## 2019-12-12 DIAGNOSIS — C44311 Basal cell carcinoma of skin of nose: Secondary | ICD-10-CM | POA: Diagnosis not present

## 2020-01-03 DIAGNOSIS — Z85828 Personal history of other malignant neoplasm of skin: Secondary | ICD-10-CM | POA: Diagnosis not present

## 2020-01-03 DIAGNOSIS — C44311 Basal cell carcinoma of skin of nose: Secondary | ICD-10-CM | POA: Diagnosis not present

## 2020-01-04 DIAGNOSIS — Z4801 Encounter for change or removal of surgical wound dressing: Secondary | ICD-10-CM | POA: Diagnosis not present

## 2020-01-13 ENCOUNTER — Other Ambulatory Visit: Payer: Self-pay | Admitting: Family Medicine

## 2020-02-13 ENCOUNTER — Other Ambulatory Visit: Payer: Self-pay

## 2020-02-13 ENCOUNTER — Ambulatory Visit (INDEPENDENT_AMBULATORY_CARE_PROVIDER_SITE_OTHER): Payer: PPO | Admitting: Family Medicine

## 2020-02-13 VITALS — BP 172/70 | HR 66 | Temp 95.8°F | Wt 172.0 lb

## 2020-02-13 DIAGNOSIS — I482 Chronic atrial fibrillation, unspecified: Secondary | ICD-10-CM | POA: Diagnosis not present

## 2020-02-13 DIAGNOSIS — E1151 Type 2 diabetes mellitus with diabetic peripheral angiopathy without gangrene: Secondary | ICD-10-CM | POA: Diagnosis not present

## 2020-02-13 DIAGNOSIS — I10 Essential (primary) hypertension: Secondary | ICD-10-CM | POA: Diagnosis not present

## 2020-02-13 NOTE — Progress Notes (Signed)
Subjective:    Patient ID: [name], male    DOB: June 29, 1928, 86 y.o.   MRN: [id number]  HPI  10/12/19 At the patient's last visit, we stopped his amlodipine.  We also started the patient on Lasix 80 mg a day.  The swelling in his right leg has improved dramatically.  Patient denies any chest pain or shortness of breath or dyspnea on exertion.  Despite stopping the amlodipine, his blood pressure still well controlled at 126/58.  The patient has decreased the Lasix to 40 mg once a day.  However he is still taking the diuretic.  At that time, my plan was:  Patient can discontinue Lasix now.  There is no evidence of fluid overload on exam.  He will monitor his leg on a daily basis.  If the swelling returns he prefers to take Lasix on an as-needed basis rather than take a low-dose every day.  Hopefully discontinuing the amlodipine will prevent the peripheral edema from reoccurring.  Recheck bmp.  02/13/20 Around that same time, the patient reduced his hydralazine from 75 mg 3 times a day to 75 mg twice a day.  Due to stopping the amlodipine and reducing the frequency of hydralazine, his blood pressure has risen.  Over the last week, he has been awakening every morning with a slight dull headache.  This prompted him to check his blood pressure.  At home his systolic blood pressure has been as high as 180.  Here today I got 172/70 in his right arm and 170/70 in his left arm confirming similar readings.  He denies any chest pain or shortness of breath or dyspnea on exertion.  He has only use the Lasix twice since I last saw him Past Medical History:  Diagnosis Date  . Atrial flutter ([place])    s/p RFCA  . CAD (coronary artery disease)    cath 2003, occluded S-RCA, occluded S-Dx, L-LAD ok, s/p PTCA to LAD  . Cataract   . CKD (chronic kidney disease) stage 3, GFR 30-59 ml/min   . Diabetes mellitus    diet controlled  . Hearing aid worn    bilateral  . History of kidney stones   . Long term  (current) use of anticoagulants   . Neuromuscular disorder ([place])   . OSA (obstructive sleep apnea) 12/11   very mild, AHI 7/hr  . Persistent atrial fibrillation ([place]) 09/26/2015  . Pure hyperglyceridemia   . PVD (peripheral vascular disease) ([place])    angioplasty of his right lower extremity in [place] by Dr.[name] 2013  . Unspecified essential hypertension   . Wears dentures    full upper and lower   Past Surgical History:  Procedure Laterality Date  . ABDOMINAL AORTOGRAM W/LOWER EXTREMITY N/A 11/15/2017   Procedure: ABDOMINAL AORTOGRAM W/LOWER EXTREMITY;  Surgeon: [name], MD;  Location: [facility];  Service: Cardiovascular;  Laterality: N/A;  . Adenosine Myoview  3/06   EF 56%, neg. Ischemia  . Adenosine Myoview  02/18/07   nml  . AMPUTATION Left 12/15/2017   Procedure: AMPUTATION BELOW KNEE;  Surgeon: Algernon [name], MD;  Location: [facility];  Service: Vascular;  Laterality: Left;  . ANGIOPLASTY  1/99   CAD- diogonal with rotational artherectomy  . Arthrectomy     of LAD & PTCA  . BLEPHAROPLASTY Bilateral   . CARDIAC CATHETERIZATION  1/00  . CARDIOVERSION  1/04  . CARDIOVERSION  5/07   hospital- a flutter  . CARPAL TUNNEL  RELEASE     ? bilateral  . CATARACT EXTRACTION W/PHACO Right 07/28/2017   Procedure: CATARACT EXTRACTION PHACO AND INTRAOCULAR LENS PLACEMENT ([place]) RIGHT DIABETIC;  Surgeon: [name], MD;  Location: [facility];  Service: Ophthalmology;  Laterality: Right;  Diabetic - diet controlled  . CATARACT EXTRACTION W/PHACO Left 08/18/2017   Procedure: CATARACT EXTRACTION PHACO AND INTRAOCULAR LENS PLACEMENT (IOC);  Surgeon: [name], MD;  Location: [facility];  Service: Ophthalmology;  Laterality: Left;  DIABETES - oral meds  . COLONOSCOPY W/ BIOPSIES  10/01/06   sigmoid polyp bx neg, 3 years  . CORONARY ANGIOPLASTY  4/03   cutting balloon PTCA pLAD into Diag  . CORONARY ARTERY BYPASS GRAFT  2000   LIMA-LAD, SVG-RCA,  SVG-Diag; SVG-Diag & SVG-RCA occluded 2003  . FRACTURE SURGERY    . HAND SURGERY  08/27/09   R thumb procedure wit Scaphoid Gragt and screws, Dr [name]  . LOWER EXTREMITY ANGIOGRAPHY Right 01/25/2019   Procedure: LOWER EXTREMITY ANGIOGRAPHY;  Surgeon: Katha Cabal, MD;  Location: [facility];  Service: Cardiovascular;  Laterality: Right;  . NM MYOVIEW LTD  4/11   normal  . PERIPHERAL VASCULAR CATHETERIZATION Right 06/27/2015   Procedure: Lower Extremity Angiography;  Surgeon: Algernon [name], MD;  Location: [facility];  Service: Cardiovascular;  Laterality: Right;  . PERIPHERAL VASCULAR CATHETERIZATION  06/27/2015   Procedure: Lower Extremity Intervention;  Surgeon: Algernon [name], MD;  Location: [facility];  Service: Cardiovascular;;   Current Outpatient Medications on File Prior to Visit  Medication Sig Dispense Refill  . acetaminophen (TYLENOL) 325 MG tablet Take 2 tablets (650 mg total) by mouth every 6 (six) hours as needed for mild pain (or temp >/= 101 F).    [name] apixaban (ELIQUIS) 5 MG TABS tablet Take 1 tablet (5 mg total) by mouth 2 (two) times daily. 180 tablet 3  . B Complex Vitamins (B COMPLEX-B12) TABS Take 1 tablet by mouth daily.     . cholecalciferol (VITAMIN D) 1000 UNITS tablet Take 1,000 Units by mouth 2 (two) times a week. Takes on Mon and Fri    . Cinnamon 500 MG capsule Take 1,000 mg by mouth 2 (two) times daily.     . ferrous sulfate 325 (65 FE) MG EC tablet Take 325 mg by mouth 3 (three) times daily with meals.    . finasteride (PROSCAR) 5 MG tablet TAKE 1 TABLET BY MOUTH ONCE DAILY 90 tablet 3  . fish oil-omega-3 fatty acids 1000 MG capsule Take 1,000 mg by mouth daily.     . furosemide (LASIX) 40 MG tablet Take 1 tablet (40 mg total) by mouth daily as needed (leg swelling). 30 tablet 0  . hydrALAZINE (APRESOLINE) 25 MG tablet Take 3 tablets (75 mg total) by mouth every 8 (eight) hours. 270 tablet 3  . lisinopril (PRINIVIL,ZESTRIL) 20 MG  tablet Take 1 tablet (20 mg total) by mouth daily. 30 tablet 0  . Multiple Vitamin (DAILY MULTIVITAMIN PO) Take 1 tablet by mouth daily.      [name] ULTRA test strip USE 1 STRIP TO CHECK GLUCOSE ONCE DAILY 50 each 5  . pentoxifylline (TRENTAL) 400 MG CR tablet TAKE 1 TABLET BY MOUTH EVERY 12 HOURS 180 tablet 0  . rosuvastatin (CRESTOR) 40 MG tablet Take 1 tablet by mouth once daily 90 tablet 3  . saccharomyces boulardii (FLORASTOR) 250 MG capsule Take 1 capsule (250 mg total) by mouth daily. 90 capsule  3  . tamsulosin (FLOMAX) 0.4 MG CAPS capsule Take 1 capsule by mouth once daily 90 capsule 3   No current facility-administered medications on file prior to visit.   Allergies  Allergen Reactions  . Isosorbide Mononitrate Other (See Comments)    Unknown, doesn't remember   . Ramipril Cough and Other (See Comments)  . Quinidine Gluconate Rash  . Quinidine Gluconate Other (See Comments) and Rash   Social History   Socioeconomic History  . Marital status: Married    Spouse name: Not on file  . Number of children: 5  . Years of education: Not on file  . Highest education level: Not on file  Occupational History  . Occupation: [employer]    Employer: RETIRED  Tobacco Use  . Smoking status: Never Smoker  . Smokeless tobacco: Former Network engineer  . Vaping Use: Never used  Substance and Sexual Activity  . Alcohol use: No  . Drug use: No  . Sexual activity: Never  Other Topics Concern  . Not on file  Social History Narrative   Retired now does some antiques   Retired from Theatre manager and dye work   Married 1951   5 kids   Social Determinants of Radio broadcast assistant Strain:   . Difficulty of Paying Living Expenses:   Food Insecurity:   . Worried About Charity fundraiser in the Last Year:   . Arboriculturist in the Last Year:   Transportation Needs:   . Film/video editor (Medical):   [name] Lack of Transportation (Non-Medical):   Physical Activity:   . Days  of Exercise per Week:   . Minutes of Exercise per Session:   Stress:   . Feeling of Stress :   Social Connections:   . Frequency of Communication with Friends and Family:   . Frequency of Social Gatherings with Friends and Family:   . Attends Religious Services:   . Active Member of Clubs or Organizations:   . Attends Archivist Meetings:   [name] Marital Status:   Intimate Partner Violence:   . Fear of Current or Ex-Partner:   . Emotionally Abused:   [name] Physically Abused:   . Sexually Abused:       Review of Systems  All other systems reviewed and are negative.      Objective:   Physical Exam Vitals reviewed.  Constitutional:      Appearance: Normal appearance.  Cardiovascular:     Rate and Rhythm: Normal rate and regular rhythm.     Pulses: Normal pulses.     Heart sounds: Normal heart sounds.  Pulmonary:     Effort: Pulmonary effort is normal.     Breath sounds: Normal breath sounds. No wheezing, rhonchi or rales.  Abdominal:     General: Abdomen is flat.     Palpations: Abdomen is soft.  Musculoskeletal:     Right lower leg: No edema.  Neurological:     Mental Status: He is alert.           Assessment & Plan:  Diabetes mellitus type 2 with peripheral artery disease ([place]) - Plan: Hemoglobin A1c, COMPLETE METABOLIC PANEL WITH GFR  Essential hypertension  Resume hydralazine 75 mg 3 times a day.  Recheck blood pressure over the next 3 days.  If systolic blood pressure does not fall below 150, I would then add amlodipine 5 mg a day.  I would like to see the  patient back next week to recheck his blood pressure and to reassess his edema in his extremities.  At that time I would like him to come in fasting so that we can also check an A1c, CMP to monitor his renal function, and a fasting lipid panel.  Patient is also requesting to transfer to Dr. Harrington Challenger.  Previously he has been seeing Dr. Harl Bowie, his cardiologist in [place].  Due to his advancing age, he is  having a difficult time driving to [place].  He would prefer to drive to [place].  He is hoping that he can see the same cardiologist as his wife so that they can hopefully consolidate their appointments to the same time to avoid multiple trips.

## 2020-02-15 ENCOUNTER — Other Ambulatory Visit: Payer: Self-pay

## 2020-02-15 ENCOUNTER — Ambulatory Visit: Payer: PPO | Admitting: Family Medicine

## 2020-02-15 ENCOUNTER — Encounter: Payer: Self-pay | Admitting: Family Medicine

## 2020-02-15 ENCOUNTER — Emergency Department (HOSPITAL_COMMUNITY)
Admission: EM | Admit: 2020-02-15 | Discharge: 2020-02-15 | Disposition: A | Discharge status: Home / Self Care | Payer: PPO | Attending: Emergency Medicine | Admitting: Emergency Medicine

## 2020-02-15 ENCOUNTER — Telehealth: Payer: Self-pay | Admitting: Cardiology

## 2020-02-15 ENCOUNTER — Encounter (HOSPITAL_COMMUNITY): Payer: Self-pay | Admitting: Emergency Medicine

## 2020-02-15 DIAGNOSIS — E1122 Type 2 diabetes mellitus with diabetic chronic kidney disease: Secondary | ICD-10-CM | POA: Insufficient documentation

## 2020-02-15 DIAGNOSIS — Z7901 Long term (current) use of anticoagulants: Secondary | ICD-10-CM | POA: Insufficient documentation

## 2020-02-15 DIAGNOSIS — I10 Essential (primary) hypertension: Secondary | ICD-10-CM | POA: Diagnosis not present

## 2020-02-15 DIAGNOSIS — Z79899 Other long term (current) drug therapy: Secondary | ICD-10-CM | POA: Diagnosis not present

## 2020-02-15 DIAGNOSIS — R519 Headache, unspecified: Secondary | ICD-10-CM | POA: Diagnosis present

## 2020-02-15 DIAGNOSIS — I4891 Unspecified atrial fibrillation: Secondary | ICD-10-CM | POA: Diagnosis not present

## 2020-02-15 DIAGNOSIS — I1310 Hypertensive heart and chronic kidney disease without heart failure, with stage 1 through stage 4 chronic kidney disease, or unspecified chronic kidney disease: Secondary | ICD-10-CM | POA: Insufficient documentation

## 2020-02-15 DIAGNOSIS — N183 Chronic kidney disease, stage 3 unspecified: Secondary | ICD-10-CM | POA: Insufficient documentation

## 2020-02-15 DIAGNOSIS — Z955 Presence of coronary angioplasty implant and graft: Secondary | ICD-10-CM | POA: Diagnosis not present

## 2020-02-15 DIAGNOSIS — I2510 Atherosclerotic heart disease of native coronary artery without angina pectoris: Secondary | ICD-10-CM | POA: Diagnosis not present

## 2020-02-15 NOTE — ED Triage Notes (Signed)
Pt c/o hypertension of 734'K systolic

## 2020-02-15 NOTE — Telephone Encounter (Signed)
Ok with me  Zandra Abts MD

## 2020-02-15 NOTE — Telephone Encounter (Signed)
Patient is requesting to switch providers from Dr. Harl Bowie to Dr. Harrington Challenger. Please advise.

## 2020-02-15 NOTE — Discharge Instructions (Addendum)
Be sure to take your amlodipine 5mg  daily and see [name] next week

## 2020-02-16 NOTE — ED Provider Notes (Signed)
[facility] EMERGENCY DEPARTMENT Provider Note   CSN: [id number] Arrival date & time: 02/15/20  2024     History Chief Complaint  Patient presents with   Hypertension    Eric [name]. is a 86 y.o. male.  The history is provided by the patient and a relative.  Hypertension This is a chronic problem. The problem occurs constantly. The problem has been gradually worsening. Associated symptoms include headaches. Pertinent negatives include no chest pain, no abdominal pain and no shortness of breath. Nothing aggravates the symptoms. Nothing relieves the symptoms.  Patient reports recent issues with his blood pressure. He reports his PCP has been monitoring this. He has recently been taking hydralazine 3 times daily. However checked his blood pressure today and it was elevated in the 200s.  He checked it multiple times and it kept increasing.  He reports very mild headache, but is otherwise asymptomatic.  He has no other acute complaints. He has PCP follow-up next week He had been instructed to restart his amlodipine 5 mg, but he is only taken 1 dose.  He did not take tonight's dose     Past Medical History:  Diagnosis Date   Atrial flutter ([place])    s/p RFCA   CAD (coronary artery disease)    cath 2003, occluded S-RCA, occluded S-Dx, L-LAD ok, s/p PTCA to LAD   Cataract    CKD (chronic kidney disease) stage 3, GFR 30-59 ml/min    Diabetes mellitus    diet controlled   Hearing aid worn    bilateral   History of kidney stones    Long term (current) use of anticoagulants    Neuromuscular disorder (H[facility])    OSA (obstructive sleep apnea) 12/11   very mild, AHI 7/hr   Persistent atrial fibrillation ([place]) 09/26/2015   Pure hyperglyceridemia    PVD (peripheral vascular disease) ([place])    angioplasty of his right lower extremity in [place] by Dr.[name] 2013   Unspecified essential hypertension    Wears dentures    full upper and lower    Patient Active Problem  List   Diagnosis Date Noted   Hypertensive urgency 03/08/2019   Hx of BKA, left ([place]) 01/13/2018   Nausea and vomiting in adult 12/26/2017   Pressure injury of skin 12/23/2017   C. difficile colitis 12/22/2017   UTI (urinary tract infection) 12/22/2017   Hyponatremia 12/22/2017   Normocytic anemia 12/22/2017   ASO (arteriosclerosis obliterans) 11/23/2017   Chronic anticoagulation 09/26/2015   History of anticoagulant therapy 09/26/2015   PVD (peripheral vascular disease) ([place]) 06/02/2012   Type 2 diabetes mellitus ([place]) 05/17/2012   Benign prostatic hyperplasia 11/16/2011   AKI (acute kidney injury) ([place]) 08/26/2011   Chronic kidney disease (CKD), stage III (moderate) 08/26/2011   Chronic coronary artery disease 03/25/2011   Obstructive sleep apnea 09/15/2010   Other specified extrapyramidal and movement disorders 09/15/2010   PERSONAL HISTORY OF COLONIC POLYPS 10/25/2009   Vitamin B-complex deficiency 03/04/2009   Atrial flutter ([place]) 12/25/2008   Hypertriglyceridemia 11/24/2006   Essential hypertension 11/24/2006    Past Surgical History:  Procedure Laterality Date   ABDOMINAL AORTOGRAM W/LOWER EXTREMITY N/A 11/15/2017   Procedure: ABDOMINAL AORTOGRAM W/LOWER EXTREMITY;  Surgeon: [name], MD;  Location: [facility];  Service: Cardiovascular;  Laterality: N/A;   Adenosine Myoview  3/06   EF 56%, neg. Ischemia   Adenosine Myoview  02/18/07   nml   AMPUTATION Left 12/15/2017   Procedure: AMPUTATION BELOW KNEE;  Surgeon: Algernon [name], MD;  Location: [facility];  Service: Vascular;  Laterality: Left;   ANGIOPLASTY  1/99   CAD- diogonal with rotational artherectomy   Arthrectomy     of LAD & PTCA   BLEPHAROPLASTY Bilateral    CARDIAC CATHETERIZATION  1/00   CARDIOVERSION  1/04   CARDIOVERSION  5/07   hospital- a flutter   CARPAL TUNNEL RELEASE     ? bilateral   CATARACT EXTRACTION W/PHACO Right 07/28/2017   Procedure:  CATARACT EXTRACTION PHACO AND INTRAOCULAR LENS PLACEMENT ([place]) RIGHT DIABETIC;  Surgeon: [name], MD;  Location: [facility];  Service: Ophthalmology;  Laterality: Right;  Diabetic - diet controlled   CATARACT EXTRACTION W/PHACO Left 08/18/2017   Procedure: CATARACT EXTRACTION PHACO AND INTRAOCULAR LENS PLACEMENT ([facility]);  Surgeon: [name], MD;  Location: [facility];  Service: Ophthalmology;  Laterality: Left;  DIABETES - oral meds   COLONOSCOPY W/ BIOPSIES  10/01/06   sigmoid polyp bx neg, 3 years   CORONARY ANGIOPLASTY  4/03   cutting balloon PTCA pLAD into Diag   CORONARY ARTERY BYPASS GRAFT  2000   LIMA-LAD, SVG-RCA, SVG-Diag; SVG-Diag & SVG-RCA occluded 2003   FRACTURE SURGERY     HAND SURGERY  08/27/09   R thumb procedure wit Scaphoid Gragt and screws, Dr [name]   LOWER EXTREMITY ANGIOGRAPHY Right 01/25/2019   Procedure: LOWER EXTREMITY ANGIOGRAPHY;  Surgeon: Katha Cabal, MD;  Location: [facility];  Service: Cardiovascular;  Laterality: Right;   NM MYOVIEW LTD  4/11   normal   PERIPHERAL VASCULAR CATHETERIZATION Right 06/27/2015   Procedure: Lower Extremity Angiography;  Surgeon: Algernon [name], MD;  Location: [facility];  Service: Cardiovascular;  Laterality: Right;   PERIPHERAL VASCULAR CATHETERIZATION  06/27/2015   Procedure: Lower Extremity Intervention;  Surgeon: Algernon [name], MD;  Location: [facility];  Service: Cardiovascular;;       Family History  Problem Relation Age of Onset   Stroke Father    Aneurysm Father    Hypertension Father    Prostate cancer Brother    Hypertension Mother    Lung cancer Brother        smoker   Thrombosis Brother    Other Brother        RF valve disorder (smoker)   Lung cancer Brother        smoker   Breast cancer Sister    Liver cancer Brother    Other Sister        cerebral hemorrhage    Social History   Tobacco Use   Smoking status:  Never Smoker   Smokeless tobacco: Former Counsellor Use: Never used  Substance Use Topics   Alcohol use: No   Drug use: No    Home Medications Prior to Admission medications   Medication Sig Start Date End Date Taking? Authorizing Provider  acetaminophen (TYLENOL) 325 MG tablet Take 2 tablets (650 mg total) by mouth every 6 (six) hours as needed for mild pain (or temp >/= 101 F). 12/21/17   Loletha Grayer, MD  apixaban (ELIQUIS) 5 MG TABS tablet Take 1 tablet (5 mg total) by mouth 2 (two) times daily. 09/15/19 10/15/19  [name], MD  B Complex Vitamins (B COMPLEX-B12) TABS Take 1 tablet by mouth daily.     [provider]  cholecalciferol (VITAMIN D) 1000 UNITS tablet Take 1,000 Units by mouth 2 (two) times a week.  Takes on Mon and Fri    [provider]  Cinnamon 500 MG capsule Take 1,000 mg by mouth 2 (two) times daily.     [provider]  ferrous sulfate 325 (65 FE) MG EC tablet Take 325 mg by mouth 3 (three) times daily with meals.    [provider]  finasteride (PROSCAR) 5 MG tablet TAKE 1 TABLET BY MOUTH ONCE DAILY 03/28/18   [name], MD  fish oil-omega-3 fatty acids 1000 MG capsule Take 1,000 mg by mouth daily.     [provider]  furosemide (LASIX) 40 MG tablet Take 1 tablet (40 mg total) by mouth daily as needed (leg swelling). 10/13/19   [name], MD  hydrALAZINE (APRESOLINE) 25 MG tablet Take 3 tablets (75 mg total) by mouth every 8 (eight) hours. 09/15/19 10/15/19  [name], MD  lisinopril (PRINIVIL,ZESTRIL) 20 MG tablet Take 1 tablet (20 mg total) by mouth daily. 12/21/17   Loletha Grayer, MD  Multiple Vitamin (DAILY MULTIVITAMIN PO) Take 1 tablet by mouth daily.      [provider]  [facility] ULTRA test strip USE 1 STRIP TO CHECK GLUCOSE ONCE DAILY 03/20/19   [name], MD  pentoxifylline (TRENTAL) 400 MG CR tablet TAKE 1 TABLET BY MOUTH EVERY 12 HOURS 01/15/20    [name], MD  rosuvastatin (CRESTOR) 40 MG tablet Take 1 tablet by mouth once daily 04/05/19   [name], MD  saccharomyces boulardii (FLORASTOR) 250 MG capsule Take 1 capsule (250 mg total) by mouth daily. 06/10/18   [name], MD  tamsulosin (FLOMAX) 0.4 MG CAPS capsule Take 1 capsule by mouth once daily 04/05/19   [name], MD    Allergies    Isosorbide mononitrate, Ramipril, Quinidine gluconate, and Quinidine gluconate  Review of Systems   Review of Systems  Constitutional: Negative for fever.  Respiratory: Negative for shortness of breath.   Cardiovascular: Negative for chest pain.  Gastrointestinal: Negative for abdominal pain.  Neurological: Positive for headaches. Negative for dizziness and weakness.  All other systems reviewed and are negative.   Physical Exam Updated Vital Signs BP (!) 201/79    Pulse (!) 58    Temp 98 F (36.7 C) (Oral)    Resp 18    Ht 1.651 m (5\' 5" )    Wt 77.1 kg    SpO2 99%    BMI 28.29 kg/m   Physical Exam CONSTITUTIONAL: Elderly, no acute distress HEAD: Normocephalic/atraumatic EYES: EOMI/PERRL ENMT: Mucous membranes moist NECK: supple no meningeal signs SPINE/BACK:entire spine nontender CV: Irregular, no murmur LUNGS: Lungs are clear to auscultation bilaterally, no apparent distress ABDOMEN: soft, nontender, no rebound or guarding, bowel sounds noted throughout abdomen GU:no cva tenderness NEURO: Pt is awake/alert/appropriate, moves all extremitiesx4.  No facial droop. No arm or leg drift EXTREMITIES: pulses normal/equal, full ROM, no lower extremity edema.  Left leg prosthetic noted SKIN: warm, color normal PSYCH: no abnormalities of mood noted, alert and oriented to situation  ED Results / Procedures / Treatments   Labs (all labs ordered are listed, but only abnormal results are displayed) Labs Reviewed - No data to display  EKG None  Radiology No results found.  Procedures Procedures     Medications Ordered in ED Medications - No data to display  ED Course  I have reviewed the triage vital signs and the nursing notes.     MDM Rules/Calculators/A&P  Patient very well-appearing.  He is here for asymptomatic hypertension.  Per his PCP notes, he was instructed to restart amlodipine 5 mg daily I advised him he can restart this nightly.  Advised him to check his blood pressure twice a day, but if he is otherwise asymptomatic he is safe to stay at home until he sees his PCP.  I advised it may take several days for his blood pressure medicines to fully take effect. Discussed this with patient and his son. Final Clinical Impression(s) / ED Diagnoses Final diagnoses:  Essential hypertension    Rx / DC Orders ED Discharge Orders    None       Ripley Fraise, MD 02/16/20 0028

## 2020-02-16 NOTE — Telephone Encounter (Signed)
OK with me.

## 2020-02-19 DIAGNOSIS — R3914 Feeling of incomplete bladder emptying: Secondary | ICD-10-CM | POA: Diagnosis not present

## 2020-02-19 DIAGNOSIS — N3021 Other chronic cystitis with hematuria: Secondary | ICD-10-CM | POA: Diagnosis not present

## 2020-02-19 DIAGNOSIS — N401 Enlarged prostate with lower urinary tract symptoms: Secondary | ICD-10-CM | POA: Diagnosis not present

## 2020-02-19 DIAGNOSIS — R3121 Asymptomatic microscopic hematuria: Secondary | ICD-10-CM | POA: Diagnosis not present

## 2020-02-19 NOTE — Telephone Encounter (Signed)
Notified patient of approval by both providers to switch. Pt has been scheduled with Dr. Harrington Challenger 04/19/20 at 11:00 am

## 2020-02-22 ENCOUNTER — Other Ambulatory Visit: Payer: Self-pay

## 2020-02-22 ENCOUNTER — Ambulatory Visit (INDEPENDENT_AMBULATORY_CARE_PROVIDER_SITE_OTHER): Payer: PPO | Admitting: Family Medicine

## 2020-02-22 VITALS — BP 150/64 | HR 75 | Temp 97.7°F | Ht 65.0 in | Wt 171.0 lb

## 2020-02-22 DIAGNOSIS — I10 Essential (primary) hypertension: Secondary | ICD-10-CM | POA: Diagnosis not present

## 2020-02-22 NOTE — Progress Notes (Signed)
Subjective:    Patient ID: Eric [name]., male    DOB: 11/05/1927, 86 y.o.   MRN: [id number]  HPI  10/12/19 At the patient's last visit, we stopped his amlodipine.  We also started the patient on Lasix 80 mg a day.  The swelling in his right leg has improved dramatically.  Patient denies any chest pain or shortness of breath or dyspnea on exertion.  Despite stopping the amlodipine, his blood pressure still well controlled at 126/58.  The patient has decreased the Lasix to 40 mg once a day.  However he is still taking the diuretic.  At that time, my plan was:  Patient can discontinue Lasix now.  There is no evidence of fluid overload on exam.  He will monitor his leg on a daily basis.  If the swelling returns he prefers to take Lasix on an as-needed basis rather than take a low-dose every day.  Hopefully discontinuing the amlodipine will prevent the peripheral edema from reoccurring.  Recheck bmp.  02/13/20 Around that same time, the patient reduced his hydralazine from 75 mg 3 times a day to 75 mg twice a day.  Due to stopping the amlodipine and reducing the frequency of hydralazine, his blood pressure has risen.  Over the last week, he has been awakening every morning with a slight dull headache.  This prompted him to check his blood pressure.  At home his systolic blood pressure has been as high as 180.  Here today I got 172/70 in his right arm and 170/70 in his left arm confirming similar readings.  He denies any chest pain or shortness of breath or dyspnea on exertion.  He has only use the Lasix twice since I last saw him.  At that time, my plan was: Resume hydralazine 75 mg 3 times a day.  Recheck blood pressure over the next 3 days.  If systolic blood pressure does not fall below 150, I would then add amlodipine 5 mg a day.  I would like to see the patient back next week to recheck his blood pressure and to reassess his edema in his extremities.  At that time I would like him to come in fasting  so that we can also check an A1c, CMP to monitor his renal function, and a fasting lipid panel.  Patient is also requesting to transfer to Dr. Harrington Challenger.  Previously he has been seeing Dr. Harl Bowie, his cardiologist in [place].  Due to his advancing age, he is having a difficult time driving to [place].  He would prefer to drive to [place].  He is hoping that he can see the same cardiologist as his wife so that they can hopefully consolidate their appointments to the same time to avoid multiple trips.  02/22/20 Over the weekend, the patient had to go to the emergency room due to an elevation in his blood pressure greater than 200/100.  At that time he started amlodipine.  Therefore he is currently on the hydralazine 75 mg 3 times daily comfortable with the overloaded pain 5 mg daily.  His blood pressure today is 150/64.  He does have +1 pitting edema in his right leg.  He denies any chest pain or shortness of breath or dyspnea on exertion. Past Medical History:  Diagnosis Date  . Atrial flutter ([place])    s/p RFCA  . CAD (coronary artery disease)    cath 2003, occluded S-RCA, occluded S-Dx, L-LAD ok, s/p PTCA to LAD  . Cataract   .  CKD (chronic kidney disease) stage 3, GFR 30-59 ml/min   . Diabetes mellitus    diet controlled  . Hearing aid worn    bilateral  . History of kidney stones   . Long term (current) use of anticoagulants   . Neuromuscular disorder ([place])   . OSA (obstructive sleep apnea) 12/11   very mild, AHI 7/hr  . Persistent atrial fibrillation ([place]) 09/26/2015  . Pure hyperglyceridemia   . PVD (peripheral vascular disease) ([place])    angioplasty of his right lower extremity in [place] by Dr.[name] 2013  . Unspecified essential hypertension   . Wears dentures    full upper and lower   Past Surgical History:  Procedure Laterality Date  . ABDOMINAL AORTOGRAM W/LOWER EXTREMITY N/A 11/15/2017   Procedure: ABDOMINAL AORTOGRAM W/LOWER EXTREMITY;  Surgeon: [name], MD;   Location: [place] [facility];  Service: Cardiovascular;  Laterality: N/A;  . Adenosine Myoview  3/06   EF 56%, neg. Ischemia  . Adenosine Myoview  02/18/07   nml  . AMPUTATION Left 12/15/2017   Procedure: AMPUTATION BELOW KNEE;  Surgeon: Algernon [name], MD;  Location: [facility];  Service: Vascular;  Laterality: Left;  . ANGIOPLASTY  1/99   CAD- diogonal with rotational artherectomy  . Arthrectomy     of LAD & PTCA  . BLEPHAROPLASTY Bilateral   . CARDIAC CATHETERIZATION  1/00  . CARDIOVERSION  1/04  . CARDIOVERSION  5/07   hospital- a flutter  . CARPAL TUNNEL RELEASE     ? bilateral  . CATARACT EXTRACTION W/PHACO Right 07/28/2017   Procedure: CATARACT EXTRACTION PHACO AND INTRAOCULAR LENS PLACEMENT ([place]) RIGHT DIABETIC;  Surgeon: [name], MD;  Location: [facility];  Service: Ophthalmology;  Laterality: Right;  Diabetic - diet controlled  . CATARACT EXTRACTION W/PHACO Left 08/18/2017   Procedure: CATARACT EXTRACTION PHACO AND INTRAOCULAR LENS PLACEMENT (IOC);  Surgeon: [name], MD;  Location: [facility];  Service: Ophthalmology;  Laterality: Left;  DIABETES - oral meds  . COLONOSCOPY W/ BIOPSIES  10/01/06   sigmoid polyp bx neg, 3 years  . CORONARY ANGIOPLASTY  4/03   cutting balloon PTCA pLAD into Diag  . CORONARY ARTERY BYPASS GRAFT  2000   LIMA-LAD, SVG-RCA, SVG-Diag; SVG-Diag & SVG-RCA occluded 2003  . FRACTURE SURGERY    . HAND SURGERY  08/27/09   R thumb procedure wit Scaphoid Gragt and screws, Dr [name]  . LOWER EXTREMITY ANGIOGRAPHY Right 01/25/2019   Procedure: LOWER EXTREMITY ANGIOGRAPHY;  Surgeon: Katha Cabal, MD;  Location: [facility];  Service: Cardiovascular;  Laterality: Right;  . NM MYOVIEW LTD  4/11   normal  . PERIPHERAL VASCULAR CATHETERIZATION Right 06/27/2015   Procedure: Lower Extremity Angiography;  Surgeon: Algernon [name], MD;  Location: [facility];  Service: Cardiovascular;  Laterality: Right;  .  PERIPHERAL VASCULAR CATHETERIZATION  06/27/2015   Procedure: Lower Extremity Intervention;  Surgeon: Algernon [name], MD;  Location: [facility];  Service: Cardiovascular;;   Current Outpatient Medications on File Prior to Visit  Medication Sig Dispense Refill  . acetaminophen (TYLENOL) 325 MG tablet Take 2 tablets (650 mg total) by mouth every 6 (six) hours as needed for mild pain (or temp >/= 101 F).    [name] amLODipine (NORVASC) 5 MG tablet Take 5 mg by mouth daily.    . B Complex Vitamins (B COMPLEX-B12) TABS Take 1 tablet by mouth daily.     . cholecalciferol (VITAMIN D) 1000  UNITS tablet Take 1,000 Units by mouth 2 (two) times a week. Takes on Mon and Fri    . Cinnamon 500 MG capsule Take 1,000 mg by mouth 2 (two) times daily.     . ferrous sulfate 325 (65 FE) MG EC tablet Take 325 mg by mouth 3 (three) times daily with meals.    . finasteride (PROSCAR) 5 MG tablet TAKE 1 TABLET BY MOUTH ONCE DAILY 90 tablet 3  . fish oil-omega-3 fatty acids 1000 MG capsule Take 1,000 mg by mouth daily.     . furosemide (LASIX) 40 MG tablet Take 1 tablet (40 mg total) by mouth daily as needed (leg swelling). 30 tablet 0  . lisinopril (PRINIVIL,ZESTRIL) 20 MG tablet Take 1 tablet (20 mg total) by mouth daily. 30 tablet 0  . Multiple Vitamin (DAILY MULTIVITAMIN PO) Take 1 tablet by mouth daily.      [name] ULTRA test strip USE 1 STRIP TO CHECK GLUCOSE ONCE DAILY 50 each 5  . pentoxifylline (TRENTAL) 400 MG CR tablet TAKE 1 TABLET BY MOUTH EVERY 12 HOURS 180 tablet 0  . rosuvastatin (CRESTOR) 40 MG tablet Take 1 tablet by mouth once daily 90 tablet 3  . saccharomyces boulardii (FLORASTOR) 250 MG capsule Take 1 capsule (250 mg total) by mouth daily. 90 capsule 3  . tamsulosin (FLOMAX) 0.4 MG CAPS capsule Take 1 capsule by mouth once daily 90 capsule 3  . apixaban (ELIQUIS) 5 MG TABS tablet Take 1 tablet (5 mg total) by mouth 2 (two) times daily. 180 tablet 3  . hydrALAZINE (APRESOLINE) 25 MG tablet Take  3 tablets (75 mg total) by mouth every 8 (eight) hours. 270 tablet 3   No current facility-administered medications on file prior to visit.   Allergies  Allergen Reactions  . Isosorbide Mononitrate Other (See Comments)    Unknown, doesn't remember   . Ramipril Cough and Other (See Comments)  . Quinidine Gluconate Rash  . Quinidine Gluconate Other (See Comments) and Rash   Social History   Socioeconomic History  . Marital status: Married    Spouse name: Not on file  . Number of children: 5  . Years of education: Not on file  . Highest education level: Not on file  Occupational History  . Occupation: [employer]    Employer: RETIRED  Tobacco Use  . Smoking status: Never Smoker  . Smokeless tobacco: Former Network engineer  . Vaping Use: Never used  Substance and Sexual Activity  . Alcohol use: No  . Drug use: No  . Sexual activity: Never  Other Topics Concern  . Not on file  Social History Narrative   Retired now does some antiques   Retired from Theatre manager and dye work   Married 1951   5 kids   Social Determinants of Radio broadcast assistant Strain:   . Difficulty of Paying Living Expenses:   Food Insecurity:   . Worried About Charity fundraiser in the Last Year:   . Arboriculturist in the Last Year:   Transportation Needs:   . Film/video editor (Medical):   [name] Lack of Transportation (Non-Medical):   Physical Activity:   . Days of Exercise per Week:   . Minutes of Exercise per Session:   Stress:   . Feeling of Stress :   Social Connections:   . Frequency of Communication with Friends and Family:   . Frequency of Social Gatherings with Friends  and Family:   . Attends Religious Services:   . Active Member of Clubs or Organizations:   . Attends Archivist Meetings:   [name] Marital Status:   Intimate Partner Violence:   . Fear of Current or Ex-Partner:   . Emotionally Abused:   [name] Physically Abused:   . Sexually Abused:       Review of  Systems  All other systems reviewed and are negative.      Objective:   Physical Exam Vitals reviewed.  Constitutional:      Appearance: Normal appearance.  Cardiovascular:     Rate and Rhythm: Normal rate and regular rhythm.     Pulses: Normal pulses.     Heart sounds: Normal heart sounds.  Pulmonary:     Effort: Pulmonary effort is normal.     Breath sounds: Normal breath sounds. No wheezing, rhonchi or rales.  Abdominal:     General: Abdomen is flat.     Palpations: Abdomen is soft.  Musculoskeletal:     Right lower leg: No edema.  Neurological:     Mental Status: He is alert.           Assessment & Plan:  Benign essential HTN - Plan: BASIC METABOLIC PANEL WITH GFR  Increase amlodipine to 10 mg a day.  Recheck blood pressure next week.  I believe this will get his blood pressure to his goal.  We will likely have to start Lasix 40 mg a day on a scheduled basis to help control the swelling caused by the amlodipine.  Check a baseline renal function today.  Plan to check a baseline renal function 1 week after starting Lasix 40 mg a day if in fact we have to do that next week.

## 2020-02-23 LAB — BASIC METABOLIC PANEL WITH GFR
BUN/Creatinine Ratio: 14 (calc) (ref 6–22)
BUN: 18 mg/dL (ref 7–25)
CO2: 25 mmol/L (ref 20–32)
Calcium: 8.9 mg/dL (ref 8.6–10.3)
Chloride: 106 mmol/L (ref 98–110)
Creat: 1.26 mg/dL — ABNORMAL HIGH (ref 0.70–1.11)
GFR, Est African American: 58 mL/min/{1.73_m2} — ABNORMAL LOW (ref >=60)
GFR, Est Non African American: 50 mL/min/{1.73_m2} — ABNORMAL LOW (ref >=60)
Glucose, Bld: 95 mg/dL (ref 65–99)
Potassium: 4 mmol/L (ref 3.5–5.3)
Sodium: 139 mmol/L (ref 135–146)

## 2020-02-26 ENCOUNTER — Other Ambulatory Visit: Payer: Self-pay | Admitting: Family Medicine

## 2020-02-28 ENCOUNTER — Telehealth: Payer: Self-pay | Admitting: *Deleted

## 2020-02-28 NOTE — Telephone Encounter (Signed)
Call placed to patient to discuss lab results: Renal function is at his baseline. I would like him to call us next week with his blood pressures. IF THE SWELLING GETS WORSE we will start the patient on Lasix 40 mg a day and then recheck his renal function in 1 week .  Reports that BP readings are as follows: Blood Pressure/ Heart Rate       AM  Noon  PM   20-Jul 139/61 61 159/ 67 67 152/ 59 67  21-Jul 133/60 70       Reports that he has noted slight edema to BLE for the past 2 days.   MD please advise.

## 2020-02-29 MED ORDER — ATENOLOL 25 MG PO TABS: 25.0000 mg | ORAL_TABLET | Freq: Every day | ORAL | 3 refills | Status: DC

## 2020-02-29 NOTE — Telephone Encounter (Signed)
If swelling is bad, I would not start lasix and only use as needed.  BP still too high in pm.  I would add atenolol 25 mg poqday to lower bp.

## 2020-02-29 NOTE — Telephone Encounter (Signed)
Call placed to patient and patient made aware.  

## 2020-03-19 ENCOUNTER — Encounter: Payer: Self-pay | Admitting: Family Medicine

## 2020-03-20 ENCOUNTER — Encounter: Payer: Self-pay | Admitting: Family Medicine

## 2020-03-20 ENCOUNTER — Other Ambulatory Visit: Payer: Self-pay | Admitting: Family Medicine

## 2020-03-20 DIAGNOSIS — Z85828 Personal history of other malignant neoplasm of skin: Secondary | ICD-10-CM | POA: Diagnosis not present

## 2020-03-20 DIAGNOSIS — M7989 Other specified soft tissue disorders: Secondary | ICD-10-CM

## 2020-03-20 DIAGNOSIS — L821 Other seborrheic keratosis: Secondary | ICD-10-CM | POA: Diagnosis not present

## 2020-03-20 DIAGNOSIS — C44319 Basal cell carcinoma of skin of other parts of face: Secondary | ICD-10-CM | POA: Diagnosis not present

## 2020-03-20 DIAGNOSIS — D1721 Benign lipomatous neoplasm of skin and subcutaneous tissue of right arm: Secondary | ICD-10-CM | POA: Diagnosis not present

## 2020-03-20 DIAGNOSIS — D485 Neoplasm of uncertain behavior of skin: Secondary | ICD-10-CM | POA: Diagnosis not present

## 2020-03-26 ENCOUNTER — Other Ambulatory Visit: Payer: Self-pay | Admitting: Family Medicine

## 2020-04-04 ENCOUNTER — Other Ambulatory Visit: Payer: Self-pay | Admitting: Family Medicine

## 2020-04-11 ENCOUNTER — Telehealth: Payer: Self-pay | Admitting: Family Medicine

## 2020-04-11 NOTE — Progress Notes (Signed)
  Chronic Care Management   Note  04/11/2020 Name: Eric [name]. MRN: [id number] DOB: 09/30/1927  Eric Fissel. is a 86 y.o. year old male who is a primary care patient of [name], MD. I reached out to [name] by phone today in response to a referral sent by [name]'s PCP, [name], MD.   Mr. Birky was given information about Chronic Care Management services today including:  1. CCM service includes personalized support from designated clinical staff supervised by his physician, including individualized plan of care and coordination with other care providers 2. 24/7 contact phone numbers for assistance for urgent and routine care needs. 3. Service will only be billed when office clinical staff spend 20 minutes or more in a month to coordinate care. 4. Only one practitioner may furnish and bill the service in a calendar month. 5. The patient may stop CCM services at any time (effective at the end of the month) by phone call to the office staff.   Patient agreed to services and verbal consent obtained.   Follow up plan:   Carley Perdue [employer] Scheduler

## 2020-04-19 ENCOUNTER — Encounter: Payer: Self-pay | Admitting: Internal Medicine

## 2020-04-19 ENCOUNTER — Other Ambulatory Visit: Payer: Self-pay

## 2020-04-19 ENCOUNTER — Ambulatory Visit: Payer: PPO | Admitting: Internal Medicine

## 2020-04-19 VITALS — BP 122/54 | HR 47 | Ht 65.5 in | Wt 168.2 lb

## 2020-04-19 DIAGNOSIS — I2510 Atherosclerotic heart disease of native coronary artery without angina pectoris: Secondary | ICD-10-CM

## 2020-04-19 NOTE — Patient Instructions (Signed)
Medication Instructions:  No changes *If you need a refill on your cardiac medications before your next appointment, please call your pharmacy*   Lab Work: none If you have labs (blood work) drawn today and your tests are completely normal, you will receive your results only by: [name] MyChart Message (if you have MyChart) OR . A paper copy in the mail If you have any lab test that is abnormal or we need to change your treatment, we will call you to review the results.   Testing/Procedures: none   Follow-Up: At [facility], you and your health needs are our priority.  As part of our continuing mission to provide you with exceptional heart care, we have created designated Provider Care Teams.  These Care Teams include your primary Cardiologist (physician) and Advanced Practice Providers (APPs -  Physician Assistants and Nurse Practitioners) who all work together to provide you with the care you need, when you need it.     Your next appointment:   6 month(s)  The format for your next appointment:   In Person  Provider:   You may see Dr. Dorris Carnes or one of the following Advanced Practice Providers on your designated Care Team:    Richardson Dopp, PA-C  Robbie Lis, [place]    Other Instructions

## 2020-04-19 NOTE — Progress Notes (Signed)
Cardiology Office Note   Date:  04/19/2020   ID:  Eric [name]., DOB October 28, 1927, MRN [id number]  PCP:  [name], MD  Cardiologist:   Dorris Carnes, MD       History of Present Illness: Eric [name]. is a 86 y.o. male with a history of CAD   He is s/p CABG in 2000 (LIMA to LAD; SVG to Diag; SVG to PDA)   In 2003 he underwent PTCA of LAD The pt also has a hx of HTN, permanent atrial fib; CKD Stage III, DM, PAD (followed at [facility]; s/p BKA L) He wa previously followed by Zandra Abts  Seen in Dec 2020  The pt was seen in ED in July 2021 for severe elevation of BP (200s)  Note he had not been on his amlodipine.   That was restarted at 5 mg daily     Since this visit his BP has been up and down   Up to 170s  He denies CP  Breathing is steady         Current Meds  Medication Sig  . acetaminophen (TYLENOL) 325 MG tablet Take 2 tablets (650 mg total) by mouth every 6 (six) hours as needed for mild pain (or temp >/= 101 F).  [name] amLODipine (NORVASC) 5 MG tablet Take 5 mg by mouth daily.  [name] apixaban (ELIQUIS) 5 MG TABS tablet Take 1 tablet (5 mg total) by mouth 2 (two) times daily.  [name] atenolol (TENORMIN) 25 MG tablet Take 1 tablet (25 mg total) by mouth daily.  . B Complex Vitamins (B COMPLEX-B12) TABS Take 1 tablet by mouth daily.   . cholecalciferol (VITAMIN D) 1000 UNITS tablet Take 1,000 Units by mouth 2 (two) times a week. Takes on Mon and Fri  . Cinnamon 500 MG capsule Take 1,000 mg by mouth 2 (two) times daily.   . ferrous sulfate 325 (65 FE) MG EC tablet Take 325 mg by mouth 3 (three) times daily with meals.  . finasteride (PROSCAR) 5 MG tablet TAKE 1 TABLET BY MOUTH ONCE DAILY  . fish oil-omega-3 fatty acids 1000 MG capsule Take 1,000 mg by mouth daily.   . furosemide (LASIX) 40 MG tablet TAKE 1 TABLET BY MOUTH ONCE DAILY AS NEEDED FOR  LEG  SWELLING  . hydrALAZINE (APRESOLINE) 25 MG tablet TAKE 3 TABLETS BY MOUTH EVERY 8 HOURS  . lisinopril (PRINIVIL,ZESTRIL) 20  MG tablet Take 1 tablet (20 mg total) by mouth daily.  . Multiple Vitamin (DAILY MULTIVITAMIN PO) Take 1 tablet by mouth daily.    [name] ULTRA test strip USE 1 STRIP TO CHECK GLUCOSE ONCE DAILY  . pentoxifylline (TRENTAL) 400 MG CR tablet TAKE 1 TABLET BY MOUTH EVERY 12 HOURS  . rosuvastatin (CRESTOR) 40 MG tablet Take 1 tablet by mouth once daily  . saccharomyces boulardii (FLORASTOR) 250 MG capsule Take 1 capsule (250 mg total) by mouth daily.  . tamsulosin (FLOMAX) 0.4 MG CAPS capsule Take 1 capsule by mouth once daily     Allergies:   Isosorbide mononitrate, Ramipril, Quinidine gluconate, and Quinidine gluconate   Past Medical History:  Diagnosis Date  . Atrial flutter ([place])    s/p RFCA  . CAD (coronary artery disease)    cath 2003, occluded S-RCA, occluded S-Dx, L-LAD ok, s/p PTCA to LAD  . Cataract   . CKD (chronic kidney disease) stage 3, GFR 30-59 ml/min   . Diabetes mellitus    diet controlled  .  Hearing aid worn    bilateral  . History of kidney stones   . Long term (current) use of anticoagulants   . Neuromuscular disorder ([place])   . OSA (obstructive sleep apnea) 12/11   very mild, AHI 7/hr  . Persistent atrial fibrillation ([place]) 09/26/2015  . Pure hyperglyceridemia   . PVD (peripheral vascular disease) ([place])    angioplasty of his right lower extremity in [place] by Dr.[name] 2013  . Unspecified essential hypertension   . Wears dentures    full upper and lower    Past Surgical History:  Procedure Laterality Date  . ABDOMINAL AORTOGRAM W/LOWER EXTREMITY N/A 11/15/2017   Procedure: ABDOMINAL AORTOGRAM W/LOWER EXTREMITY;  Surgeon: [name], MD;  Location: [facility];  Service: Cardiovascular;  Laterality: N/A;  . Adenosine Myoview  3/06   EF 56%, neg. Ischemia  . Adenosine Myoview  02/18/07   nml  . AMPUTATION Left 12/15/2017   Procedure: AMPUTATION BELOW KNEE;  Surgeon: Algernon [name], MD;  Location: [facility];  Service: Vascular;  Laterality: Left;   . ANGIOPLASTY  1/99   CAD- diogonal with rotational artherectomy  . Arthrectomy     of LAD & PTCA  . BLEPHAROPLASTY Bilateral   . CARDIAC CATHETERIZATION  1/00  . CARDIOVERSION  1/04  . CARDIOVERSION  5/07   hospital- a flutter  . CARPAL TUNNEL RELEASE     ? bilateral  . CATARACT EXTRACTION W/PHACO Right 07/28/2017   Procedure: CATARACT EXTRACTION PHACO AND INTRAOCULAR LENS PLACEMENT ([place]) RIGHT DIABETIC;  Surgeon: [name], MD;  Location: [facility];  Service: Ophthalmology;  Laterality: Right;  Diabetic - diet controlled  . CATARACT EXTRACTION W/PHACO Left 08/18/2017   Procedure: CATARACT EXTRACTION PHACO AND INTRAOCULAR LENS PLACEMENT ([facility]);  Surgeon: [name], MD;  Location: [facility];  Service: Ophthalmology;  Laterality: Left;  DIABETES - oral meds  . COLONOSCOPY W/ BIOPSIES  10/01/06   sigmoid polyp bx neg, 3 years  . CORONARY ANGIOPLASTY  4/03   cutting balloon PTCA pLAD into Diag  . CORONARY ARTERY BYPASS GRAFT  2000   LIMA-LAD, SVG-RCA, SVG-Diag; SVG-Diag & SVG-RCA occluded 2003  . FRACTURE SURGERY    . HAND SURGERY  08/27/09   R thumb procedure wit Scaphoid Gragt and screws, Dr [name]  . LOWER EXTREMITY ANGIOGRAPHY Right 01/25/2019   Procedure: LOWER EXTREMITY ANGIOGRAPHY;  Surgeon: Katha Cabal, MD;  Location: [facility];  Service: Cardiovascular;  Laterality: Right;  . NM MYOVIEW LTD  4/11   normal  . PERIPHERAL VASCULAR CATHETERIZATION Right 06/27/2015   Procedure: Lower Extremity Angiography;  Surgeon: Algernon [name], MD;  Location: [facility];  Service: Cardiovascular;  Laterality: Right;  . PERIPHERAL VASCULAR CATHETERIZATION  06/27/2015   Procedure: Lower Extremity Intervention;  Surgeon: Algernon [name], MD;  Location: [facility] [facility];  Service: Cardiovascular;;     Social History:  The patient  reports that he has never smoked. He quit smokeless tobacco use about 27 years ago. He reports that he does  not drink alcohol and does not use drugs.   Family History:  The patient's family history includes Aneurysm in his father; Breast cancer in his sister; Hypertension in his father and mother; Liver cancer in his brother; Lung cancer in his brother and brother; Other in his brother and sister; Prostate cancer in his brother; Stroke in his father; Thrombosis in his brother.    ROS:  Please see the history of  present illness. All other systems are reviewed and  Negative to the above problem except as noted.    PHYSICAL EXAM: VS:  BP (!) 122/54   Pulse (!) 47   Ht 5' 5.5" (1.664 m)   Wt 168 lb 3.2 oz (76.3 kg)   BMI 27.56 kg/m   GEN: Well nourished, well developed, in no acute distress  HEENT: normal  Neck: no JVD, carotid bruits Cardiac: RRR; no murmurs   No LE edema  Respiratory:  clear to auscultation bilaterally, GII: soft, nontender, nondistended, + BS  No hepatomegaly  MS:  S/p L BKA   Skin: warm and dry, no rash Neuro:  Strength and sensation are intact Psych: euthymic mood, full affect   EKG:  EKG is ordered today.  Sinus bradycardia  47 bpm     Lipid Panel    Component Value Date/Time   CHOL 98 05/03/2019 0840   TRIG 120 05/03/2019 0840   HDL 33 (L) 05/03/2019 0840   CHOLHDL 3.0 05/03/2019 0840   VLDL 39 (H) 03/01/2017 0828   LDLCALC 44 05/03/2019 0840   LDLDIRECT 99.1 02/19/2011 0823      Wt Readings from Last 3 Encounters:  04/19/20 168 lb 3.2 oz (76.3 kg)  02/22/20 171 lb (77.6 kg)  02/15/20 170 lb (77.1 kg)      ASSESSMENT AND PLAN:  1  CAD   I am not convinced of active angina.  Continue to follow   2  Bradycardia   Need to confirm if he is taking atenolol   His list does not have it   If he is on it then I would taper back  3  HTN  BP is OK today   Keep on same medsd  4  HL  Las lipids LDL 44  HDL 33  5  PAD   Follow[facility]    Current medicines are reviewed at length with the patient today.  The patient does not have concerns regarding  medicines.  Signed, Dorris Carnes, MD  04/19/2020 11:36 AM    [address], [place], [place]  [address] Phone: [phone]; Fax: [phone]

## 2020-04-25 ENCOUNTER — Telehealth: Payer: Self-pay | Admitting: *Deleted

## 2020-04-25 MED ORDER — ATENOLOL 25 MG PO TABS
12.5000 mg | ORAL_TABLET | Freq: Every day | ORAL | 3 refills | Status: DC
Start: 2020-04-25 — End: 2021-06-20

## 2020-04-25 NOTE — Telephone Encounter (Signed)
-----   Message from Fay Records, MD sent at 04/19/2020 10:21 PM EDT ----- Please confirm if patient taking atenolol   Two lists werre conflicting   Patient wasn't sure If he is on it then I would cut in half.   His HR was quite low

## 2020-04-25 NOTE — Telephone Encounter (Signed)
Called patient. He was taking atenolol 25 mg and since the last visit w Dr. Harrington Challenger he started to take every other day. Recent BP/HR: 146/67, 61   147/75, 60   148/67 59  I adv him to start taking 12.5 mg daily so that he doesn't have to remember which days he has taken it and which days he hasn't. Medication list updated.

## 2020-04-26 ENCOUNTER — Other Ambulatory Visit: Payer: Self-pay | Admitting: Family Medicine

## 2020-04-29 ENCOUNTER — Other Ambulatory Visit: Payer: Self-pay | Admitting: Family Medicine

## 2020-05-01 ENCOUNTER — Other Ambulatory Visit: Payer: Self-pay | Admitting: Family Medicine

## 2020-05-01 DIAGNOSIS — M7989 Other specified soft tissue disorders: Secondary | ICD-10-CM

## 2020-05-10 ENCOUNTER — Other Ambulatory Visit: Payer: Self-pay | Admitting: Family Medicine

## 2020-05-13 ENCOUNTER — Ambulatory Visit (INDEPENDENT_AMBULATORY_CARE_PROVIDER_SITE_OTHER): Payer: PPO | Admitting: Vascular Surgery

## 2020-05-13 ENCOUNTER — Encounter (INDEPENDENT_AMBULATORY_CARE_PROVIDER_SITE_OTHER): Payer: PPO

## 2020-05-17 ENCOUNTER — Other Ambulatory Visit: Payer: Self-pay | Admitting: Family Medicine

## 2020-05-27 ENCOUNTER — Other Ambulatory Visit: Payer: Self-pay | Admitting: Family Medicine

## 2020-05-28 NOTE — Chronic Care Management (AMB) (Addendum)
Chronic Care Management Pharmacy  Name: Eric [name].  MRN: [id number] DOB: 1928/05/27   Chief Complaint/ HPI  Eric [name].,  86 y.o. , male presents for their Initial CCM visit with the clinical pharmacist In office.  PCP : [name], MD  Their chronic conditions include: HTN, Type II DM w/ CKD, Hypertriglyceridemia, Atrial flutter  Office Visits: 02/22/2020 ([facility]) -  Reported to ED over the weekend due to BP in the 200s.  Edema in R leg.  Amlodipine was increased to 81m daily and blood pressure followed.  Likely have to start Lasix 420mdaily to control swelling.  Plan to check baseline renal function 1 week post lasix initiation.  02/13/2020 ([facility]) -  Hydralazine was increased back to tid, based on blood pressures he would also plan to add amlodipine 47m62maily if BP > 150130stolic.  Consult Visit: 04/19/2020 ([facility]) -  He was seen in the ED in July for elevated BP amlodipine was restarted at 47mg547mily, BP has been fluctuating ever since.  No medication changes currently, if he was taking atenolol then plans were to taper back.  Medications: Outpatient Encounter Medications as of 05/29/2020  Medication Sig   acetaminophen (TYLENOL) 325 MG tablet Take 2 tablets (650 mg total) by mouth every 6 (six) hours as needed for mild pain (or temp >/= 101 F).   amLODipine (NORVASC) 5 MG tablet Take 10 mg by mouth daily.    apixaban (ELIQUIS) 5 MG TABS tablet Take 1 tablet (5 mg total) by mouth 2 (two) times daily.   atenolol (TENORMIN) 25 MG tablet Take 0.5 tablets (12.5 mg total) by mouth daily.   B Complex Vitamins (B COMPLEX-B12) TABS Take 1 tablet by mouth daily.    cholecalciferol (VITAMIN D) 1000 UNITS tablet Take 1,000 Units by mouth 2 (two) times a week. Takes on Mon and Fri   Cinnamon 500 MG capsule Take 1,000 mg by mouth 2 (two) times daily.    ferrous sulfate 325 (65 FE) MG EC tablet Take 325 mg by mouth 3 (three) times daily with meals.    finasteride (PROSCAR) 5 MG tablet TAKE 1 TABLET BY MOUTH ONCE DAILY   fish oil-omega-3 fatty acids 1000 MG capsule Take 1,000 mg by mouth daily.    furosemide (LASIX) 40 MG tablet TAKE 1 TABLET BY MOUTH ONCE DAILY AS NEEDED FOR  LEG  SWELLING   hydrALAZINE (APRESOLINE) 25 MG tablet TAKE 3 TABLETS BY MOUTH EVERY 8 HOURS   lisinopril (PRINIVIL,ZESTRIL) 20 MG tablet Take 1 tablet (20 mg total) by mouth daily. (Patient not taking: Reported on 05/29/2020)   lisinopril (ZESTRIL) 40 MG tablet Take 1 tablet by mouth once daily   Multiple Vitamin (DAILY MULTIVITAMIN PO) Take 1 tablet by mouth daily.     ONETOUCH ULTRA test strip USE 1 STRIP TO CHECK GLUCOSE ONCE DAILY   pentoxifylline (TRENTAL) 400 MG CR tablet TAKE 1 TABLET BY MOUTH EVERY 12 HOURS   rosuvastatin (CRESTOR) 40 MG tablet Take 1 tablet by mouth once daily   saccharomyces boulardii (FLORASTOR) 250 MG capsule Take 1 capsule (250 mg total) by mouth daily.   tamsulosin (FLOMAX) 0.4 MG CAPS capsule Take 1 capsule by mouth once daily   No facility-administered encounter medications on file as of 05/29/2020.     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   Hypertension BP Readings from Last 3 Encounters:  04/19/20 (!) 122/54  02/22/20 (!) 150/64  02/15/20 (!) 201/79   Pharmacist Clinical Goal(s): Over the next 180 days, patient will work with PharmD and providers to achieve BP goal <140/90 Current regimen:  Amlodipine 68m Atenolol 227mone-half tablet daily Lisinopril  4042mydralazine 10m79mree tablets three  times daily Interventions: Reviewed home monitoring procedures Discussed medication adherence Patient self care activities - Over the next 180 days, patient will: Check BP daily, document, and provide at future appointments Ensure daily salt intake < 2300 mg/day  Hyperlipidemia Lab Results  Component Value Date/Time   LDLCALC 44 05/03/2019 08:40 AM   LDLDIRECT 99.1 02/19/2011 08:23 AM   Pharmacist Clinical Goal(s): Over the next 180 days, patient will work with PharmD and providers to maintain LDL goal < 70 Current regimen:  Rosuvastatin 40mg53mterventions: Reviewed most recent lipid panel Discussed importance of stating medications Patient self care activities - Over the next 180 days, patient will: Continue to focus on medication adherence via pill box  Diabetes Lab Results  Component Value Date/Time   HGBA1C 5.3 05/03/2019 08:40 AM   HGBA1C 5.5 10/20/2018 08:55 AM   Pharmacist Clinical Goal(s): Over the next 180 days, patient will work with PharmD and providers to maintain A1c goal <7% Current regimen:  No medications Interventions: Reviewed home blood sugar readings  Discussed importance of diet and exercise Patient self care activities - Over the next 180 days, patient will: Check blood sugar once daily, document, and provide at future appointments Contact provider with any episodes of hypoglycemia  Atrial Flutter Pharmacist Clinical Goal(s) Over the next 180 days, patient will work with PharmD and providers to optimize medication and minimize symptoms of atrial flutter. Current regimen:  Eliquis 5mg t37me daily Interventions: Reviewed current dose for safety Discussed medication affordability Patient self care activities - Over the next 180 days, patient will: Continue to take medication as directed Contact providers with signs of abnormal bleeding   Initial goal documentation         Hypertension   BP goal is:  <130/80  Office blood pressures  are  BP Readings from Last 3 Encounters:  04/19/20 (!) 122/54  02/22/20 (!) 150/64  02/15/20 (!) 201/79   Patient checks BP at home daily Patient home BP readings are ranging: 150s/80s P around 55  Patient has failed these meds in the past: Ramipril (cough) , isosorbide Patient is currently uncontrolled on the following medications:  Amlodipine 10mg A61mlol 10mg on38mlf tablet po daily Lisinopril  40mg Hyd41mzine 10mg thre55mblets po tid  We discussed  Still checking BP daily, denies headaches or dizziness Denies any extremely high readings lately Will be 150s/80 sy947M/54and then when he calms down it will go closer to 130s He is now taking Amlodipine 10mg and s36m reporting some higher BP readings Reports some swelling for which he takes prn lasix Encouraged him to continue checking and report consistent systolic readings > 150  Plan  650tinue current medications     Hypertriglyceridemia   LDL goal < 70  Lipid Panel     Component Value Date/Time   CHOL 98 05/03/2019 0840   TRIG 120  05/03/2019 0840   HDL 33 (L) 05/03/2019 0840   LDLCALC 44 05/03/2019 0840   LDLDIRECT 99.1 02/19/2011 0823    Hepatic Function Latest Ref Rng & Units 05/03/2019 10/20/2018 01/26/2018  Total Protein 6.1 - 8.1 g/dL 6.5 6.5 6.0(L)  Albumin 3.5 - 5.0 g/dL - - -  AST 10 - 35 U/L 27 29 32  ALT 9 - 46 U/L '19 16 23  ' Alk Phosphatase 38 - 126 U/L - - -  Total Bilirubin 0.2 - 1.2 mg/dL 0.5 0.6 0.5  Bilirubin, Direct 0.0 - 0.3 mg/dL - - -     The ASCVD Risk score Mikey Bussing DC [name], et al., 2013) failed to calculate for the following reasons:   The 2013 ASCVD risk score is only valid for ages 40 to 57   Patient has failed these meds in past: none noted Patient is currently controlled on the following medications:  Rosuvastatin 9m  We discussed:   Denies myalgias Reports adherence to medication LDL and TG controlled at last lipid panel Recommend repeat lipid panel at next  CPE  Plan  Continue current medications, recommend repeat lipid panel  . Diabetes   A1c goal <7%  Recent Relevant Labs: Lab Results  Component Value Date/Time   HGBA1C 5.3 05/03/2019 08:40 AM   HGBA1C 5.5 10/20/2018 08:55 AM   GFR 48.95 (L) 10/10/2013 08:59 AM   GFR 47.20 (L) 09/30/2012 08:38 AM   MICROALBUR 2.4 03/01/2017 08:42 AM   MICROALBUR 4.8 08/13/2016 08:00 AM    Last diabetic Eye exam:  Lab Results  Component Value Date/Time   HMDIABEYEEXA No Retinopathy 03/29/2018 12:00 AM    Last diabetic Foot exam:  Lab Results  Component Value Date/Time   HMDIABFOOTEX yes 01/29/2010 12:00 AM     Checking BG: Daily  Recent FBG Readings: 100-120   Patient has failed these meds in past: none noted Patient is currently controlled on the following medications: No medications at this time  We discussed:  He still monitors blood sugar almost daily if he remembers Denies hypoglycemia He reports fasting sugars within goal Educated on goals for FBG < 130  Plan  Continue current medications Atrial Flutter   Patient has failed these meds in past: none noted Patient is currently controlled on the following medications:  Eliquis 516mtwice daily  We discussed:  He is taking medication as directed Reports some bruising and was interested in decreasing dose Counseled that he is currently on the appropriate dose for his current kidney function and body weight. Counseled to notify providers if any abnormal bleeding occurs.  Plan  Continue current medications Vaccines   Reviewed and discussed patient's vaccination history.    Immunization History  Administered Date(s) Administered   Fluad Quad(high Dose 65+) 05/03/2019   Influenza Split 07/14/2011, 06/02/2012   Influenza Whole 05/24/2005, 05/20/2007, 05/21/2008, 07/19/2009, 05/28/2010   Influenza, High Dose Seasonal PF 05/03/2017   Influenza,inj,Quad PF,6+ Mos 04/11/2013, 04/20/2014, 05/06/2015, 06/02/2016,  05/13/2018   Influenza-Unspecified 05/03/2017   PFIZER SARS-COV-2 Vaccination 10/05/2019   Pneumococcal Conjugate-13 05/06/2015   Pneumococcal Polysaccharide-23 10/17/2013   Td 03/13/2004   Tdap 04/14/2019    Plan  Recommended patient receive Shingrix vaccine in pharmacy, reports flu vaccine already recieved.  Medication Management   Miscellaneous medications: Tamsulosin 0.26m26mapsules Pentoxifylline 400m18mC's:  Vitamin D  Cinnamon 500mg23mrous Sulfate 325 B-complex tabs Patient currently uses Walma[employer]/process development scientistatient reports using pill box  method to organize medications and promote adherence. Patient denies missed  doses of medication.   Beverly Milch, PharmD Clinical Pharmacist [facility] [phone]   I have collaborated with the care management provider regarding care management and care coordination activities outlined in this encounter and have reviewed this encounter including documentation in the note and care plan. I am certifying that I agree with the content of this note and encounter as supervising physician.

## 2020-05-29 ENCOUNTER — Other Ambulatory Visit: Payer: Self-pay | Admitting: Family Medicine

## 2020-05-29 ENCOUNTER — Ambulatory Visit: Payer: PPO | Admitting: Pharmacist

## 2020-05-29 ENCOUNTER — Other Ambulatory Visit: Payer: Self-pay

## 2020-05-29 DIAGNOSIS — E1152 Type 2 diabetes mellitus with diabetic peripheral angiopathy with gangrene: Secondary | ICD-10-CM

## 2020-05-29 DIAGNOSIS — I4892 Unspecified atrial flutter: Secondary | ICD-10-CM

## 2020-05-29 DIAGNOSIS — I10 Essential (primary) hypertension: Secondary | ICD-10-CM

## 2020-05-29 DIAGNOSIS — E781 Pure hyperglyceridemia: Secondary | ICD-10-CM

## 2020-05-29 NOTE — Patient Instructions (Addendum)
Visit Information Thank you for meeting with me today!  I look forward to working with you to help you meet all of your healthcare goals and answer any questions you may have.  Feel free to contact me anytime!  Goals Addressed            This Visit's Progress   . Pharmacy Care Plan:       CARE PLAN ENTRY (see longitudinal plan of care for additional care plan information)  Current Barriers:  . Chronic Disease Management support, education, and care coordination needs related to Hypertension, Hyperlipidemia, Diabetes, and atrial flutter.   Hypertension BP Readings from Last 3 Encounters:  04/19/20 (!) 122/54  02/22/20 (!) 150/64  02/15/20 (!) 201/79   . Pharmacist Clinical Goal(s): o Over the next 180 days, patient will work with PharmD and providers to achieve BP goal <140/90 . Current regimen:  . Amlodipine 10mg  . Atenolol 25mg  one-half tablet daily . Lisinopril  40mg  . Hydralazine 25mg  three tablets three times daily . Interventions: o Reviewed home monitoring procedures o Discussed medication adherence . Patient self care activities - Over the next 180 days, patient will: o Check BP daily, document, and provide at future appointments o Ensure daily salt intake < 2300 mg/day  Hyperlipidemia Lab Results  Component Value Date/Time   LDLCALC 44 05/03/2019 08:40 AM   LDLDIRECT 99.1 02/19/2011 08:23 AM   . Pharmacist Clinical Goal(s): o Over the next 180 days, patient will work with PharmD and providers to maintain LDL goal < 70 . Current regimen:  o Rosuvastatin 40mg   . Interventions: o Reviewed most recent lipid panel o Discussed importance of stating medications . Patient self care activities - Over the next 180 days, patient will: o Continue to focus on medication adherence via pill box  Diabetes Lab Results  Component Value Date/Time   HGBA1C 5.3 05/03/2019 08:40 AM   HGBA1C 5.5 10/20/2018 08:55 AM   . Pharmacist Clinical Goal(s): o Over the next 180 days,  patient will work with PharmD and providers to maintain A1c goal <7% . Current regimen:  o No medications . Interventions: o Reviewed home blood sugar readings  o Discussed importance of diet and exercise . Patient self care activities - Over the next 180 days, patient will: o Check blood sugar once daily, document, and provide at future appointments o Contact provider with any episodes of hypoglycemia  Atrial Flutter . Pharmacist Clinical Goal(s) o Over the next 180 days, patient will work with PharmD and providers to optimize medication and minimize symptoms of atrial flutter. . Current regimen:  o Eliquis 5mg  twice daily . Interventions: o Reviewed current dose for safety o Discussed medication affordability . Patient self care activities - Over the next 180 days, patient will: o Continue to take medication as directed o Contact providers with signs of abnormal bleeding   Initial goal documentation        Eric [name] was given information about Chronic Care Management services today including:  1. CCM service includes personalized support from designated clinical staff supervised by his physician, including individualized plan of care and coordination with other care providers 2. 24/7 contact phone numbers for assistance for urgent and routine care needs. 3. Standard insurance, coinsurance, copays and deductibles apply for chronic care management only during months in which we provide at least 20 minutes of these services. Most insurances cover these services at 100%, however patients may be responsible for any copay, coinsurance and/or deductible if applicable. This service  may help you avoid the need for more expensive face-to-face services. 4. Only one practitioner may furnish and bill the service in a calendar month. 5. The patient may stop CCM services at any time (effective at the end of the month) by phone call to the office staff.  Patient agreed to services and verbal  consent obtained.   The patient verbalized understanding of instructions provided today and agreed to receive a mailed copy of patient instruction and/or educational materials. Telephone follow up appointment with pharmacy team member scheduled for: 6 months  Beverly Milch, PharmD Clinical Pharmacist [name] [phone]   Hypertension, Adult High blood pressure (hypertension) is when the force of blood pumping through the arteries is too strong. The arteries are the blood vessels that carry blood from the heart throughout the body. Hypertension forces the heart to work harder to pump blood and may cause arteries to become narrow or stiff. Untreated or uncontrolled hypertension can cause a heart attack, heart failure, a stroke, kidney disease, and other problems. A blood pressure reading consists of a higher number over a lower number. Ideally, your blood pressure should be below 120/80. The first ("top") number is called the systolic pressure. It is a measure of the pressure in your arteries as your heart beats. The second ("bottom") number is called the diastolic pressure. It is a measure of the pressure in your arteries as the heart relaxes. What are the causes? The exact cause of this condition is not known. There are some conditions that result in or are related to high blood pressure. What increases the risk? Some risk factors for high blood pressure are under your control. The following factors may make you more likely to develop this condition:  Smoking.  Having type 2 diabetes mellitus, high cholesterol, or both.  Not getting enough exercise or physical activity.  Being overweight.  Having too much fat, sugar, calories, or salt (sodium) in your diet.  Drinking too much alcohol. Some risk factors for high blood pressure may be difficult or impossible to change. Some of these factors include:  Having chronic kidney disease.  Having a family history of  high blood pressure.  Age. Risk increases with age.  Race. You may be at higher risk if you are African American.  Gender. Men are at higher risk than women before age 26. After age 72, women are at higher risk than men.  Having obstructive sleep apnea.  Stress. What are the signs or symptoms? High blood pressure may not cause symptoms. Very high blood pressure (hypertensive crisis) may cause:  Headache.  Anxiety.  Shortness of breath.  Nosebleed.  Nausea and vomiting.  Vision changes.  Severe chest pain.  Seizures. How is this diagnosed? This condition is diagnosed by measuring your blood pressure while you are seated, with your arm resting on a flat surface, your legs uncrossed, and your feet flat on the floor. The cuff of the blood pressure monitor will be placed directly against the skin of your upper arm at the level of your heart. It should be measured at least twice using the same arm. Certain conditions can cause a difference in blood pressure between your right and left arms. Certain factors can cause blood pressure readings to be lower or higher than normal for a short period of time:  When your blood pressure is higher when you are in a health care provider's office than when you are at home, this is called white coat hypertension.  Most people with this condition do not need medicines.  When your blood pressure is higher at home than when you are in a health care provider's office, this is called masked hypertension. Most people with this condition may need medicines to control blood pressure. If you have a high blood pressure reading during one visit or you have normal blood pressure with other risk factors, you may be asked to:  Return on a different day to have your blood pressure checked again.  Monitor your blood pressure at home for 1 week or longer. If you are diagnosed with hypertension, you may have other blood or imaging tests to help your health care  provider understand your overall risk for other conditions. How is this treated? This condition is treated by making healthy lifestyle changes, such as eating healthy foods, exercising more, and reducing your alcohol intake. Your health care provider may prescribe medicine if lifestyle changes are not enough to get your blood pressure under control, and if:  Your systolic blood pressure is above 130.  Your diastolic blood pressure is above 80. Your personal target blood pressure may vary depending on your medical conditions, your age, and other factors. Follow these instructions at home: Eating and drinking   Eat a diet that is high in fiber and potassium, and low in sodium, added sugar, and fat. An example eating plan is called the DASH (Dietary Approaches to Stop Hypertension) diet. To eat this way: ? Eat plenty of fresh fruits and vegetables. Try to fill one half of your plate at each meal with fruits and vegetables. ? Eat whole grains, such as whole-wheat pasta, brown rice, or whole-grain bread. Fill about one fourth of your plate with whole grains. ? Eat or drink low-fat dairy products, such as skim milk or low-fat yogurt. ? Avoid fatty cuts of meat, processed or cured meats, and poultry with skin. Fill about one fourth of your plate with lean proteins, such as fish, chicken without skin, beans, eggs, or tofu. ? Avoid pre-made and processed foods. These tend to be higher in sodium, added sugar, and fat.  Reduce your daily sodium intake. Most people with hypertension should eat less than 1,500 mg of sodium a day.  Do not drink alcohol if: ? Your health care provider tells you not to drink. ? You are pregnant, may be pregnant, or are planning to become pregnant.  If you drink alcohol: ? Limit how much you use to:  0-1 drink a day for women.  0-2 drinks a day for men. ? Be aware of how much alcohol is in your drink. In the [place] one drink equals one 12 oz bottle of beer (355 mL), one  5 oz glass of wine (148 mL), or one 1 oz glass of hard liquor (44 mL). Lifestyle   Work with your health care provider to maintain a healthy body weight or to lose weight. Ask what an ideal weight is for you.  Get at least 30 minutes of exercise most days of the week. Activities may include walking, swimming, or biking.  Include exercise to strengthen your muscles (resistance exercise), such as Pilates or lifting weights, as part of your weekly exercise routine. Try to do these types of exercises for 30 minutes at least 3 days a week.  Do not use any products that contain nicotine or tobacco, such as cigarettes, e-cigarettes, and chewing tobacco. If you need help quitting, ask your health care provider.  Monitor your blood pressure at home  as told by your health care provider.  Keep all follow-up visits as told by your health care provider. This is important. Medicines  Take over-the-counter and prescription medicines only as told by your health care provider. Follow directions carefully. Blood pressure medicines must be taken as prescribed.  Do not skip doses of blood pressure medicine. Doing this puts you at risk for problems and can make the medicine less effective.  Ask your health care provider about side effects or reactions to medicines that you should watch for. Contact a health care provider if you:  Think you are having a reaction to a medicine you are taking.  Have headaches that keep coming back (recurring).  Feel dizzy.  Have swelling in your ankles.  Have trouble with your vision. Get help right away if you:  Develop a severe headache or confusion.  Have unusual weakness or numbness.  Feel faint.  Have severe pain in your chest or abdomen.  Vomit repeatedly.  Have trouble breathing. Summary  Hypertension is when the force of blood pumping through your arteries is too strong. If this condition is not controlled, it may put you at risk for serious  complications.  Your personal target blood pressure may vary depending on your medical conditions, your age, and other factors. For most people, a normal blood pressure is less than 120/80.  Hypertension is treated with lifestyle changes, medicines, or a combination of both. Lifestyle changes include losing weight, eating a healthy, low-sodium diet, exercising more, and limiting alcohol. This information is not intended to replace advice given to you by your health care provider. Make sure you discuss any questions you have with your health care provider. Document Revised: 04/06/2018 Document Reviewed: 04/06/2018 Elsevier Patient Education  2020 [employer].

## 2020-06-06 DIAGNOSIS — C44319 Basal cell carcinoma of skin of other parts of face: Secondary | ICD-10-CM | POA: Diagnosis not present

## 2020-06-11 ENCOUNTER — Other Ambulatory Visit: Payer: Self-pay | Admitting: *Deleted

## 2020-06-11 DIAGNOSIS — I10 Essential (primary) hypertension: Secondary | ICD-10-CM

## 2020-06-11 DIAGNOSIS — E1152 Type 2 diabetes mellitus with diabetic peripheral angiopathy with gangrene: Secondary | ICD-10-CM

## 2020-06-11 DIAGNOSIS — H6121 Impacted cerumen, right ear: Secondary | ICD-10-CM

## 2020-06-11 DIAGNOSIS — E1151 Type 2 diabetes mellitus with diabetic peripheral angiopathy without gangrene: Secondary | ICD-10-CM

## 2020-06-11 DIAGNOSIS — I482 Chronic atrial fibrillation, unspecified: Secondary | ICD-10-CM

## 2020-06-11 DIAGNOSIS — I739 Peripheral vascular disease, unspecified: Secondary | ICD-10-CM

## 2020-06-11 DIAGNOSIS — E781 Pure hyperglyceridemia: Secondary | ICD-10-CM

## 2020-06-11 DIAGNOSIS — I4892 Unspecified atrial flutter: Secondary | ICD-10-CM

## 2020-06-14 ENCOUNTER — Other Ambulatory Visit: Payer: Self-pay | Admitting: Family Medicine

## 2020-06-14 ENCOUNTER — Other Ambulatory Visit: Payer: Self-pay

## 2020-06-14 DIAGNOSIS — I10 Essential (primary) hypertension: Secondary | ICD-10-CM

## 2020-06-14 MED ORDER — AMLODIPINE BESYLATE 10 MG PO TABS: 10.0000 mg | ORAL_TABLET | Freq: Every day | ORAL | 3 refills | Status: DC

## 2020-07-02 ENCOUNTER — Other Ambulatory Visit: Payer: Self-pay | Admitting: Family Medicine

## 2020-07-05 ENCOUNTER — Other Ambulatory Visit: Payer: Self-pay | Admitting: Family Medicine

## 2020-07-11 ENCOUNTER — Other Ambulatory Visit: Payer: Self-pay

## 2020-07-11 ENCOUNTER — Telehealth: Payer: Self-pay

## 2020-07-11 ENCOUNTER — Encounter: Payer: Self-pay | Admitting: Family Medicine

## 2020-07-11 ENCOUNTER — Emergency Department (HOSPITAL_COMMUNITY)
Admission: EM | Admit: 2020-07-11 | Discharge: 2020-07-11 | Disposition: A | Discharge status: Home / Self Care | Payer: PPO | Attending: Emergency Medicine | Admitting: Emergency Medicine

## 2020-07-11 ENCOUNTER — Encounter (HOSPITAL_COMMUNITY): Payer: Self-pay

## 2020-07-11 DIAGNOSIS — R319 Hematuria, unspecified: Secondary | ICD-10-CM

## 2020-07-11 DIAGNOSIS — Z79899 Other long term (current) drug therapy: Secondary | ICD-10-CM | POA: Diagnosis not present

## 2020-07-11 DIAGNOSIS — I2510 Atherosclerotic heart disease of native coronary artery without angina pectoris: Secondary | ICD-10-CM | POA: Insufficient documentation

## 2020-07-11 DIAGNOSIS — Z951 Presence of aortocoronary bypass graft: Secondary | ICD-10-CM | POA: Insufficient documentation

## 2020-07-11 DIAGNOSIS — E1122 Type 2 diabetes mellitus with diabetic chronic kidney disease: Secondary | ICD-10-CM | POA: Insufficient documentation

## 2020-07-11 DIAGNOSIS — N3001 Acute cystitis with hematuria: Secondary | ICD-10-CM | POA: Insufficient documentation

## 2020-07-11 DIAGNOSIS — Z955 Presence of coronary angioplasty implant and graft: Secondary | ICD-10-CM | POA: Diagnosis not present

## 2020-07-11 DIAGNOSIS — N1832 Chronic kidney disease, stage 3b: Secondary | ICD-10-CM | POA: Diagnosis not present

## 2020-07-11 DIAGNOSIS — Z7901 Long term (current) use of anticoagulants: Secondary | ICD-10-CM | POA: Insufficient documentation

## 2020-07-11 DIAGNOSIS — E1169 Type 2 diabetes mellitus with other specified complication: Secondary | ICD-10-CM | POA: Insufficient documentation

## 2020-07-11 DIAGNOSIS — I4891 Unspecified atrial fibrillation: Secondary | ICD-10-CM | POA: Diagnosis not present

## 2020-07-11 DIAGNOSIS — I129 Hypertensive chronic kidney disease with stage 1 through stage 4 chronic kidney disease, or unspecified chronic kidney disease: Secondary | ICD-10-CM | POA: Insufficient documentation

## 2020-07-11 DIAGNOSIS — Z86010 Personal history of colonic polyps: Secondary | ICD-10-CM | POA: Diagnosis not present

## 2020-07-11 DIAGNOSIS — E781 Pure hyperglyceridemia: Secondary | ICD-10-CM | POA: Diagnosis not present

## 2020-07-11 LAB — CBC WITH DIFFERENTIAL/PLATELET
Abs Immature Granulocytes: 0.02 10*3/uL (ref 0.00–0.07)
Basophils Absolute: 0.1 10*3/uL (ref 0.0–0.1)
Basophils Relative: 1 %
Eosinophils Absolute: 0.2 10*3/uL (ref 0.0–0.5)
Eosinophils Relative: 2 %
HCT: 31.4 % — ABNORMAL LOW (ref 39.0–52.0)
Hemoglobin: 10 g/dL — ABNORMAL LOW (ref 13.0–17.0)
Immature Granulocytes: 0 %
Lymphocytes Relative: 36 %
Lymphs Abs: 2.8 10*3/uL (ref 0.7–4.0)
MCH: 30.9 pg (ref 26.0–34.0)
MCHC: 31.8 g/dL (ref 30.0–36.0)
MCV: 96.9 fL (ref 80.0–100.0)
Monocytes Absolute: 0.7 10*3/uL (ref 0.1–1.0)
Monocytes Relative: 8 %
Neutro Abs: 4.2 10*3/uL (ref 1.7–7.7)
Neutrophils Relative %: 53 %
Platelets: 129 10*3/uL — ABNORMAL LOW (ref 150–400)
RBC: 3.24 MIL/uL — ABNORMAL LOW (ref 4.22–5.81)
RDW: 16.3 % — ABNORMAL HIGH (ref 11.5–15.5)
WBC: 7.8 10*3/uL (ref 4.0–10.5)
nRBC: 0 % (ref 0.0–0.2)

## 2020-07-11 LAB — COMPREHENSIVE METABOLIC PANEL
ALT: 35 U/L (ref 0–44)
AST: 50 U/L — ABNORMAL HIGH (ref 15–41)
Albumin: 3.3 g/dL — ABNORMAL LOW (ref 3.5–5.0)
Alkaline Phosphatase: 40 U/L (ref 38–126)
Anion gap: 5 (ref 5–15)
BUN: 21 mg/dL (ref 8–23)
CO2: 23 mmol/L (ref 22–32)
Calcium: 8.4 mg/dL — ABNORMAL LOW (ref 8.9–10.3)
Chloride: 106 mmol/L (ref 98–111)
Creatinine, Ser: 1.12 mg/dL (ref 0.61–1.24)
GFR, Estimated: >60 mL/min (ref >60)
Glucose, Bld: 86 mg/dL (ref 70–99)
Potassium: 4.6 mmol/L (ref 3.5–5.1)
Sodium: 134 mmol/L — ABNORMAL LOW (ref 135–145)
Total Bilirubin: 0.7 mg/dL (ref 0.3–1.2)
Total Protein: 6.5 g/dL (ref 6.5–8.1)

## 2020-07-11 LAB — URINALYSIS, ROUTINE W REFLEX MICROSCOPIC
Bilirubin Urine: NEGATIVE
Glucose, UA: 50 mg/dL — AB
Ketones, ur: 5 mg/dL — AB
Nitrite: POSITIVE — AB
Protein, ur: 100 mg/dL — AB
RBC / HPF: >50 RBC/hpf — ABNORMAL HIGH (ref 0–5)
Specific Gravity, Urine: 1.012 (ref 1.005–1.030)
pH: 6 (ref 5.0–8.0)

## 2020-07-11 MED ORDER — CEPHALEXIN 500 MG PO CAPS: 500.0000 mg | ORAL_CAPSULE | Freq: Four times a day (QID) | ORAL | 0 refills | Status: AC

## 2020-07-11 MED ORDER — CEPHALEXIN 500 MG PO CAPS
500.0000 mg | ORAL_CAPSULE | Freq: Once | ORAL | Status: AC
  Administered 2020-07-11: 500 mg via ORAL
  Filled 2020-07-11: qty 1

## 2020-07-11 NOTE — Telephone Encounter (Signed)
sure

## 2020-07-11 NOTE — Telephone Encounter (Signed)
Call placed to patient.   Was advised that patient has gone to ER as he is concerned about the level of bleeding noted win urine. States that he is concerned D/T Eliquis use.   Noted patient has had labs in ER at this time.

## 2020-07-11 NOTE — ED Provider Notes (Signed)
  Face-to-face evaluation   History: He is here for evaluation of blood in urine.  He reports that this problem started today.  He had this a year ago but cannot recall the details of that episode.  He takes Eliquis apparently for atrial flutter.  He denies recent fever, chills, cough, shortness of breath, weakness or dizziness.  He presents today for evaluation by private vehicle.  He has been ambulatory.   Physical exam: Alert elderly male who is calm and comfortable.  Heart regular rate and rhythm without murmur.  Lungs clear anteriorly.  Abdomen soft nontender without palpable mass  Medical screening examination/treatment/procedure(s) were conducted as a shared visit with non-physician practitioner(s) and myself.  I personally evaluated the patient during the encounter    Daleen Bo, MD 07/12/20 [phone]

## 2020-07-11 NOTE — Discharge Instructions (Addendum)
Return if any problems.

## 2020-07-11 NOTE — Telephone Encounter (Signed)
Mr. Ludvigsen thinks he has blood in his urine. Ok for urine specimen ?

## 2020-07-11 NOTE — ED Provider Notes (Signed)
[facility] EMERGENCY DEPARTMENT Provider Note   CSN: [id number] Arrival date & time: 07/11/20  1330     History Chief Complaint  Patient presents with  . Hematuria    Eric [name]. is a 86 y.o. male.  The history is provided by the patient. No language interpreter was used.  Hematuria This is a new problem. The current episode started 6 to 12 hours ago. The problem occurs constantly. The problem has not changed since onset.Nothing aggravates the symptoms. Nothing relieves the symptoms. He has tried nothing for the symptoms. The treatment provided no relief.   Pt complains of blood in his urine.  Pt reports he is on eliquis for atrial fibrillation     Past Medical History:  Diagnosis Date  . Atrial flutter ([place])    s/p RFCA  . CAD (coronary artery disease)    cath 2003, occluded S-RCA, occluded S-Dx, L-LAD ok, s/p PTCA to LAD  . Cataract   . CKD (chronic kidney disease) stage 3, GFR 30-59 ml/min (HCC)   . Diabetes mellitus    diet controlled  . Hearing aid worn    bilateral  . History of kidney stones   . Long term (current) use of anticoagulants   . Neuromuscular disorder ([place])   . OSA (obstructive sleep apnea) 12/11   very mild, AHI 7/hr  . Persistent atrial fibrillation ([place]) 09/26/2015  . Pure hyperglyceridemia   . PVD (peripheral vascular disease) ([place])    angioplasty of his right lower extremity in [place] by Dr.[name] 2013  . Unspecified essential hypertension   . Wears dentures    full upper and lower    Patient Active Problem List   Diagnosis Date Noted  . Hypertensive urgency 03/08/2019  . Hx of BKA, left ([place]) 01/13/2018  . Nausea and vomiting in adult 12/26/2017  . Pressure injury of skin 12/23/2017  . C. difficile colitis 12/22/2017  . UTI (urinary tract infection) 12/22/2017  . Hyponatremia 12/22/2017  . Normocytic anemia 12/22/2017  . ASO (arteriosclerosis obliterans) 11/23/2017  . Chronic anticoagulation 09/26/2015  . History of  anticoagulant therapy 09/26/2015  . PVD (peripheral vascular disease) ([place]) 06/02/2012  . Type 2 diabetes mellitus ([place]) 05/17/2012  . Benign prostatic hyperplasia 11/16/2011  . AKI (acute kidney injury) ([place]) 08/26/2011  . Chronic kidney disease (CKD), stage III (moderate) ([place]) 08/26/2011  . Chronic coronary artery disease 03/25/2011  . Obstructive sleep apnea 09/15/2010  . Other specified extrapyramidal and movement disorders 09/15/2010  . PERSONAL HISTORY OF COLONIC POLYPS 10/25/2009  . Vitamin B-complex deficiency 03/04/2009  . Atrial flutter ([place]) 12/25/2008  . Hypertriglyceridemia 11/24/2006  . Essential hypertension 11/24/2006    Past Surgical History:  Procedure Laterality Date  . ABDOMINAL AORTOGRAM W/LOWER EXTREMITY N/A 11/15/2017   Procedure: ABDOMINAL AORTOGRAM W/LOWER EXTREMITY;  Surgeon: [name], MD;  Location: [place] [facility];  Service: Cardiovascular;  Laterality: N/A;  . Adenosine Myoview  3/06   EF 56%, neg. Ischemia  . Adenosine Myoview  02/18/07   nml  . AMPUTATION Left 12/15/2017   Procedure: AMPUTATION BELOW KNEE;  Surgeon: Algernon [name], MD;  Location: [facility] ORS;  Service: Vascular;  Laterality: Left;  . ANGIOPLASTY  1/99   CAD- diogonal with rotational artherectomy  . Arthrectomy     of LAD & PTCA  . BLEPHAROPLASTY Bilateral   . CARDIAC CATHETERIZATION  1/00  . CARDIOVERSION  1/04  . CARDIOVERSION  5/07   hospital- a flutter  . CARPAL TUNNEL RELEASE     ?  bilateral  . CATARACT EXTRACTION W/PHACO Right 07/28/2017   Procedure: CATARACT EXTRACTION PHACO AND INTRAOCULAR LENS PLACEMENT ([place]) RIGHT DIABETIC;  Surgeon: [name], MD;  Location: [place];  Service: Ophthalmology;  Laterality: Right;  Diabetic - diet controlled  . CATARACT EXTRACTION W/PHACO Left 08/18/2017   Procedure: CATARACT EXTRACTION PHACO AND INTRAOCULAR LENS PLACEMENT (IOC);  Surgeon: [name], MD;  Location: [place];  Service:  Ophthalmology;  Laterality: Left;  DIABETES - oral meds  . COLONOSCOPY W/ BIOPSIES  10/01/06   sigmoid polyp bx neg, 3 years  . CORONARY ANGIOPLASTY  4/03   cutting balloon PTCA pLAD into Diag  . CORONARY ARTERY BYPASS GRAFT  2000   LIMA-LAD, SVG-RCA, SVG-Diag; SVG-Diag & SVG-RCA occluded 2003  . FRACTURE SURGERY    . HAND SURGERY  08/27/09   R thumb procedure wit Scaphoid Gragt and screws, Dr [name]  . LOWER EXTREMITY ANGIOGRAPHY Right 01/25/2019   Procedure: LOWER EXTREMITY ANGIOGRAPHY;  Surgeon: Katha Cabal, MD;  Location: [place] [facility];  Service: Cardiovascular;  Laterality: Right;  . NM MYOVIEW LTD  4/11   normal  . PERIPHERAL VASCULAR CATHETERIZATION Right 06/27/2015   Procedure: Lower Extremity Angiography;  Surgeon: Algernon [name], MD;  Location: [place] [facility];  Service: Cardiovascular;  Laterality: Right;  . PERIPHERAL VASCULAR CATHETERIZATION  06/27/2015   Procedure: Lower Extremity Intervention;  Surgeon: Algernon [name], MD;  Location: [place] [facility];  Service: Cardiovascular;;       Family History  Problem Relation Age of Onset  . Stroke Father   . Aneurysm Father   . Hypertension Father   . Prostate cancer Brother   . Hypertension Mother   . Lung cancer Brother        smoker  . Thrombosis Brother   . Other Brother        RF valve disorder (smoker)  . Lung cancer Brother        smoker  . Breast cancer Sister   . Liver cancer Brother   . Other Sister        cerebral hemorrhage    Social History   Tobacco Use  . Smoking status: Never Smoker  . Smokeless tobacco: Former Network engineer  . Vaping Use: Never used  Substance Use Topics  . Alcohol use: No  . Drug use: No    Home Medications Prior to Admission medications   Medication Sig Start Date End Date Taking? Authorizing Provider  acetaminophen (TYLENOL) 325 MG tablet Take 2 tablets (650 mg total) by mouth every 6 (six) hours as needed for mild pain (or temp >/= 101 F). 12/21/17   Yes Wieting, Richard, MD  amLODipine (NORVASC) 10 MG tablet Take 1 tablet (10 mg total) by mouth daily. 06/14/20  Yes [name], MD  apixaban (ELIQUIS) 5 MG TABS tablet Take 1 tablet (5 mg total) by mouth 2 (two) times daily. 09/15/19 07/11/20 Yes [name], MD  atenolol (TENORMIN) 25 MG tablet Take 0.5 tablets (12.5 mg total) by mouth daily. 04/25/20  Yes Fay Records, MD  B Complex Vitamins (B COMPLEX-B12) TABS Take 1 tablet by mouth daily.    Yes [provider]  cholecalciferol (VITAMIN D) 1000 UNITS tablet Take 1,000 Units by mouth 2 (two) times a week. Takes on Mon and Fri   Yes [provider]  Cinnamon 500 MG capsule Take 1,000 mg by mouth 2 (two) times daily.    Yes [provider]  ferrous sulfate 325 (65 FE) MG EC tablet Take 325 mg by mouth 3 (three) times daily with meals.   Yes [provider]  finasteride (PROSCAR) 5 MG tablet TAKE 1 TABLET BY MOUTH ONCE DAILY 03/28/18  Yes [name], MD  fish oil-omega-3 fatty acids 1000 MG capsule Take 1,000 mg by mouth daily.    Yes [provider]  furosemide (LASIX) 40 MG tablet TAKE 1 TABLET BY MOUTH ONCE DAILY AS NEEDED FOR  LEG  SWELLING Patient taking differently: Take 40 mg by mouth daily.  05/01/20  Yes [name], MD  hydrALAZINE (APRESOLINE) 25 MG tablet TAKE 3 TABLETS BY MOUTH EVERY 8 HOURS Patient taking differently: Take 75 mg by mouth every 8 (eight) hours.  05/29/20  Yes [place], Modena Nunnery, MD  lisinopril (ZESTRIL) 40 MG tablet Take 1 tablet by mouth once daily 05/27/20  Yes Pickard, Cammie Mcgee, MD  Multiple Vitamin (DAILY MULTIVITAMIN PO) Take 1 tablet by mouth daily.     Yes [provider]  pentoxifylline (TRENTAL) 400 MG CR tablet TAKE 1 TABLET BY MOUTH EVERY 12 HOURS 05/13/20  Yes [name], MD  rosuvastatin (CRESTOR) 40 MG tablet Take 1 tablet by mouth once daily 04/29/20  Yes Pickard, Cammie Mcgee, MD  saccharomyces boulardii (FLORASTOR) 250 MG  capsule Take 1 capsule (250 mg total) by mouth daily. 06/10/18  Yes [name], MD  tamsulosin (FLOMAX) 0.4 MG CAPS capsule Take 1 capsule by mouth once daily 07/08/20  Yes Pickard, Cammie Mcgee, MD  lisinopril (PRINIVIL,ZESTRIL) 20 MG tablet Take 1 tablet (20 mg total) by mouth daily. Patient not taking: Reported on 05/29/2020 12/21/17   Loletha Grayer, MD  [facility] ULTRA test strip USE 1 STRIP TO CHECK GLUCOSE ONCE DAILY 07/03/20   [name], MD    Allergies    Isosorbide mononitrate, Ramipril, Quinidine gluconate, and Quinidine gluconate  Review of Systems   Review of Systems  Genitourinary: Positive for hematuria.  All other systems reviewed and are negative.   Physical Exam Updated Vital Signs BP (!) 163/54   Pulse 62   Temp 98.1 F (36.7 C) (Oral)   Resp 14   Ht 5\' 5"  (1.651 m)   Wt 72.6 kg   SpO2 97%   BMI 26.63 kg/m   Physical Exam Vitals and nursing note reviewed.  Constitutional:      Appearance: Normal appearance. He is well-developed.  HENT:     Head: Normocephalic.  Cardiovascular:     Rate and Rhythm: Normal rate.  Pulmonary:     Effort: Pulmonary effort is normal.  Abdominal:     General: Abdomen is flat. There is no distension.  Musculoskeletal:        General: Normal range of motion.     Cervical back: Normal range of motion.  Neurological:     General: No focal deficit present.     Mental Status: He is alert and oriented to person, place, and time.  Psychiatric:        Mood and Affect: Mood normal.     ED Results / Procedures / Treatments   Labs (all labs ordered are listed, but only abnormal results are displayed) Labs Reviewed  URINALYSIS, ROUTINE W REFLEX MICROSCOPIC - Abnormal; Notable for the following components:      Result Value   Color, Urine AMBER (*)    APPearance TURBID (*)    Glucose, UA 50 (*)    Hgb urine dipstick  LARGE (*)    Ketones, ur 5 (*)    Protein, ur 100 (*)    Nitrite POSITIVE (*)    Leukocytes,Ua  MODERATE (*)    RBC / HPF >50 (*)    Bacteria, UA FEW (*)    All other components within normal limits  CBC WITH DIFFERENTIAL/PLATELET - Abnormal; Notable for the following components:   RBC 3.24 (*)    Hemoglobin 10.0 (*)    HCT 31.4 (*)    RDW 16.3 (*)    Platelets 129 (*)    All other components within normal limits  COMPREHENSIVE METABOLIC PANEL - Abnormal; Notable for the following components:   Sodium 134 (*)    Calcium 8.4 (*)    Albumin 3.3 (*)    AST 50 (*)    All other components within normal limits    EKG EKG Interpretation  Date/Time:  Thursday July 11 2020 14:38:34 EST Ventricular Rate:  49 PR Interval:    QRS Duration: 114 QT Interval:  435 QTC Calculation: 393 R Axis:   49 Text Interpretation: Atrial fibrillation Ventricular premature complex Borderline intraventricular conduction delay Since last tracing rate slower Otherwise no significant change Confirmed by Daleen Bo [phone]) on 07/11/2020 4:28:14 PM   Radiology No results found.  Procedures Procedures (including critical care time)  Medications Ordered in ED Medications - No data to display  ED Course  I have reviewed the triage vital signs and the nursing notes.  Pertinent labs & imaging results that were available during my care of the patient were reviewed by me and considered in my medical decision making (see chart for details).    MDM Rules/Calculators/A&P                          MDM:  Pt's urine shows bacteria, wbc and rbc.  I will culture and treat with Keflex.  Pt given 1st dosage here.  Pt advised to follow up with his urologist for recheck  Final Clinical Impression(s) / ED Diagnoses Final diagnoses:  Hematuria, unspecified type  Acute cystitis with hematuria    Rx / DC Orders ED Discharge Orders         Ordered    cephALEXin (KEFLEX) 500 MG capsule  4 times daily        07/11/20 1704        An After Visit Summary was printed and given to the patient.      Fransico Meadow, [place] 07/11/20 1704    Daleen Bo, MD 07/12/20 [phone]

## 2020-07-11 NOTE — ED Triage Notes (Signed)
Pt reports he noticed blood in his urine yesterday. Also reports slight burning

## 2020-07-12 NOTE — Telephone Encounter (Signed)
Patient was seen in E.R with concerns of the amount of blood in urine

## 2020-07-22 ENCOUNTER — Ambulatory Visit: Payer: Self-pay | Admitting: Pharmacist

## 2020-07-22 ENCOUNTER — Other Ambulatory Visit: Payer: Self-pay | Admitting: Family Medicine

## 2020-07-22 NOTE — Chronic Care Management (AMB) (Signed)
Chronic Care Management   Follow Up Note   07/22/2020 Name: Joaovictor Krone. MRN: [id number] DOB: [birth year]  Referred by: [name], MD Reason for referral : No chief complaint on file.   Henson Fraticelli. is a 86 y.o. year old male who is a primary care patient of Pickard, Cammie Mcgee, MD. The CCM team was consulted for assistance with chronic disease management and care coordination needs.    Review of patient status, including review of consultants reports, relevant laboratory and other test results, and collaboration with appropriate care team members and the patient's provider was performed as part of comprehensive patient evaluation and provision of chronic care management services.    SDOH (Social Determinants of Health) assessments performed: No See Care Plan activities for detailed interventions related to [facility])     Outpatient Encounter Medications as of 07/22/2020  Medication Sig  . acetaminophen (TYLENOL) 325 MG tablet Take 2 tablets (650 mg total) by mouth every 6 (six) hours as needed for mild pain (or temp >/= 101 F).  [name] amLODipine (NORVASC) 10 MG tablet Take 1 tablet (10 mg total) by mouth daily.  [name] apixaban (ELIQUIS) 5 MG TABS tablet Take 1 tablet (5 mg total) by mouth 2 (two) times daily.  [name] atenolol (TENORMIN) 25 MG tablet Take 0.5 tablets (12.5 mg total) by mouth daily.  . B Complex Vitamins (B COMPLEX-B12) TABS Take 1 tablet by mouth daily.   . cholecalciferol (VITAMIN D) 1000 UNITS tablet Take 1,000 Units by mouth 2 (two) times a week. Takes on Mon and Fri  . Cinnamon 500 MG capsule Take 1,000 mg by mouth 2 (two) times daily.   . ferrous sulfate 325 (65 FE) MG EC tablet Take 325 mg by mouth 3 (three) times daily with meals.  . finasteride (PROSCAR) 5 MG tablet TAKE 1 TABLET BY MOUTH ONCE DAILY  . fish oil-omega-3 fatty acids 1000 MG capsule Take 1,000 mg by mouth daily.   . furosemide (LASIX) 40 MG tablet TAKE 1 TABLET BY MOUTH ONCE DAILY AS NEEDED FOR   LEG  SWELLING (Patient taking differently: Take 40 mg by mouth daily. )  . hydrALAZINE (APRESOLINE) 25 MG tablet TAKE 3 TABLETS BY MOUTH EVERY 8 HOURS (Patient taking differently: Take 75 mg by mouth every 8 (eight) hours. )  . lisinopril (PRINIVIL,ZESTRIL) 20 MG tablet Take 1 tablet (20 mg total) by mouth daily. (Patient not taking: Reported on 05/29/2020)  . lisinopril (ZESTRIL) 40 MG tablet Take 1 tablet by mouth once daily  . Multiple Vitamin (DAILY MULTIVITAMIN PO) Take 1 tablet by mouth daily.    [name] ULTRA test strip USE 1 STRIP TO CHECK GLUCOSE ONCE DAILY  . pentoxifylline (TRENTAL) 400 MG CR tablet TAKE 1 TABLET BY MOUTH EVERY 12 HOURS  . rosuvastatin (CRESTOR) 40 MG tablet Take 1 tablet by mouth once daily  . saccharomyces boulardii (FLORASTOR) 250 MG capsule Take 1 capsule (250 mg total) by mouth daily.  . tamsulosin (FLOMAX) 0.4 MG CAPS capsule Take 1 capsule by mouth once daily   No facility-administered encounter medications on file as of 07/22/2020.     Objective:  Returning patients call regarding cost burden of Eliquis and request for samples.  Goals Addressed   None     There are no care plans to display for this patient.  Patient called to report that he needs about two weeks worth of samples of his Eliquis to avoid having to pay a high cost  for refill due to donut hole.    Informed patient that we did have enough Eliquis 5mg  to provide two weeks worth of samples to in order the get him to the end of the year until his insurance year starts over.    #28 tablets of Eliquis 5mg  samples prepared and left with front desk for pickup.  Patient to come by anytime to retrieve them.   Also reviewed care gaps and adherence for STAR measures:  Care Gaps:  AWV not performed for this year  Adherence Information  Cholesterol - 100% adherence on Rosuvastatin 40mg   HTN - 94.5% adherence on lisinopril 40mg   Patient meets 5 star category for both medications, he  continues to get 90 days supply Plan:   The patient has been provided with contact information for the care management team and has been advised to call with any health related questions or concerns.    Beverly Milch, PharmD Clinical Pharmacist [place] [phone]

## 2020-07-24 ENCOUNTER — Ambulatory Visit (INDEPENDENT_AMBULATORY_CARE_PROVIDER_SITE_OTHER): Payer: PPO | Admitting: Nurse Practitioner

## 2020-07-24 ENCOUNTER — Other Ambulatory Visit: Payer: Self-pay

## 2020-07-24 ENCOUNTER — Encounter: Payer: Self-pay | Admitting: Nurse Practitioner

## 2020-07-24 VITALS — BP 130/70 | HR 60 | Temp 97.9°F | Ht 65.0 in | Wt 176.6 lb

## 2020-07-24 DIAGNOSIS — R319 Hematuria, unspecified: Secondary | ICD-10-CM | POA: Diagnosis not present

## 2020-07-24 LAB — URINALYSIS, ROUTINE W REFLEX MICROSCOPIC
Bacteria, UA: NONE SEEN /HPF
Bilirubin Urine: NEGATIVE
Glucose, UA: NEGATIVE
Hyaline Cast: NONE SEEN /LPF
Ketones, ur: NEGATIVE
Nitrite: NEGATIVE
RBC / HPF: >=60 /HPF — AB (ref 0–2)
Specific Gravity, Urine: 1.015 (ref 1.001–1.03)
Squamous Epithelial / HPF: NONE SEEN /HPF (ref <=5)
pH: 5.5 (ref 5.0–8.0)

## 2020-07-24 LAB — CBC WITH DIFFERENTIAL/PLATELET
Absolute Monocytes: 830 cells/uL (ref 200–950)
Basophils Absolute: 105 cells/uL (ref 0–200)
Basophils Relative: 1 %
Eosinophils Absolute: 242 cells/uL (ref 15–500)
Eosinophils Relative: 2.3 %
HCT: 29.7 % — ABNORMAL LOW (ref 38.5–50.0)
Hemoglobin: 9.8 g/dL — ABNORMAL LOW (ref 13.2–17.1)
Lymphs Abs: 3959 cells/uL — ABNORMAL HIGH (ref 850–3900)
MCH: 30.6 pg (ref 27.0–33.0)
MCHC: 33 g/dL (ref 32.0–36.0)
MCV: 92.8 fL (ref 80.0–100.0)
MPV: 10.3 fL (ref 7.5–12.5)
Monocytes Relative: 7.9 %
Neutro Abs: 5366 cells/uL (ref 1500–7800)
Neutrophils Relative %: 51.1 %
Platelets: 162 10*3/uL (ref 140–400)
RBC: 3.2 10*6/uL — ABNORMAL LOW (ref 4.20–5.80)
RDW: 13.7 % (ref 11.0–15.0)
Total Lymphocyte: 37.7 %
WBC: 10.5 10*3/uL (ref 3.8–10.8)

## 2020-07-24 LAB — MICROSCOPIC MESSAGE

## 2020-07-24 MED ORDER — CIPROFLOXACIN HCL 250 MG PO TABS: 250.0000 mg | ORAL_TABLET | Freq: Two times a day (BID) | ORAL | 0 refills | Status: DC

## 2020-07-24 NOTE — Progress Notes (Signed)
Subjective:    Patient ID: Eric Hough., male    DOB: 22-Apr-1928, 86 y.o.   MRN: [id number]  HPI: Eric [name]. is a 86 y.o. male presenting for blood in his urine.  Chief Complaint  Patient presents with  . Hematuria  . Dysuria    Painful and burning while urination which began on Monday. Bleeding not as much but still sees some blood while urinating. Given cephalexin in the past for this issue   HEMATURIA Duration: days; has history of hematuria and has followed with Urologist, Dr. Jeffie Pollock in the past.  Is unable to recall the last time he saw Urologist.  Was seen in ED on 07/11/2020 with same and was treated for UTI with cephalexin.  Symptoms improved slowly but recurred prior to finishing course of antibiotics.  This episode started Sunday evening and was "pretty bad" on Monday. Gross hematuria: yes Microscopic hematuria: yes Dipstick hematuria: yes Circumstances of discovering hematuria: woke up Sunday Proteinuria: yes Red cell casts: no Dark brown/cola colored urine: yes Clots in urine: no  Recent urine culture: no Dysuria/urinary frequency: yes Urinary incontinence: no Urinary hesistancy/dribbling: yes; chronic for "some time" - years Back pain:no Suprapubic pain/pressure: no Flank pain: no Recent URI: yes; treated with cephalexin  Recent vigorous exercise/trauma: no Status: fluctuating; has been getting better  Allergies  Allergen Reactions  . Isosorbide Mononitrate Other (See Comments)    Unknown, doesn't remember   . Ramipril Cough and Other (See Comments)  . Quinidine Gluconate Rash  . Quinidine Gluconate Other (See Comments) and Rash    Outpatient Encounter Medications as of 07/24/2020  Medication Sig  . acetaminophen (TYLENOL) 325 MG tablet Take 2 tablets (650 mg total) by mouth every 6 (six) hours as needed for mild pain (or temp >/= 101 F).  [name] amLODipine (NORVASC) 10 MG tablet Take 1 tablet (10 mg total) by mouth daily.  [name] atenolol  (TENORMIN) 25 MG tablet Take 0.5 tablets (12.5 mg total) by mouth daily.  . B Complex Vitamins (B COMPLEX-B12) TABS Take 1 tablet by mouth daily.   . cholecalciferol (VITAMIN D) 1000 UNITS tablet Take 1,000 Units by mouth 2 (two) times a week. Takes on Mon and Fri  . Cinnamon 500 MG capsule Take 1,000 mg by mouth 2 (two) times daily.  . ferrous sulfate 325 (65 FE) MG EC tablet Take 325 mg by mouth 3 (three) times daily with meals.  . finasteride (PROSCAR) 5 MG tablet TAKE 1 TABLET BY MOUTH ONCE DAILY  . fish oil-omega-3 fatty acids 1000 MG capsule Take 1,000 mg by mouth daily.  . furosemide (LASIX) 40 MG tablet TAKE 1 TABLET BY MOUTH ONCE DAILY AS NEEDED FOR  LEG  SWELLING (Patient taking differently: Take 40 mg by mouth daily.)  . hydrALAZINE (APRESOLINE) 25 MG tablet TAKE 3 TABLETS BY MOUTH EVERY 8 HOURS (Patient taking differently: Take 75 mg by mouth every 8 (eight) hours.)  . lisinopril (PRINIVIL,ZESTRIL) 20 MG tablet Take 1 tablet (20 mg total) by mouth daily.  [name] lisinopril (ZESTRIL) 40 MG tablet Take 1 tablet by mouth once daily  . Multiple Vitamin (DAILY MULTIVITAMIN PO) Take 1 tablet by mouth daily.  [name] ULTRA test strip USE 1 STRIP TO CHECK GLUCOSE ONCE DAILY  . pentoxifylline (TRENTAL) 400 MG CR tablet TAKE 1 TABLET BY MOUTH EVERY 12 HOURS  . rosuvastatin (CRESTOR) 40 MG tablet TAKE 1 TABLET BY MOUTH ONCE DAILY (CALL  CLINIC  TO  SCHEDULE  AN  APPOINTMENT  FOR  FUTURE  REFILLS)  . saccharomyces boulardii (FLORASTOR) 250 MG capsule Take 1 capsule (250 mg total) by mouth daily.  . tamsulosin (FLOMAX) 0.4 MG CAPS capsule Take 1 capsule by mouth once daily  . apixaban (ELIQUIS) 5 MG TABS tablet Take 1 tablet (5 mg total) by mouth 2 (two) times daily.  . ciprofloxacin (CIPRO) 250 MG tablet Take 1 tablet (250 mg total) by mouth 2 (two) times daily.   No facility-administered encounter medications on file as of 07/24/2020.    Patient Active Problem List   Diagnosis Date Noted  .  Hematuria 07/24/2020  . Hypertensive urgency 03/08/2019  . Hx of BKA, left ([place]) 01/13/2018  . Nausea and vomiting in adult 12/26/2017  . Pressure injury of skin 12/23/2017  . C. difficile colitis 12/22/2017  . UTI (urinary tract infection) 12/22/2017  . Hyponatremia 12/22/2017  . Normocytic anemia 12/22/2017  . ASO (arteriosclerosis obliterans) 11/23/2017  . Chronic anticoagulation 09/26/2015  . History of anticoagulant therapy 09/26/2015  . PVD (peripheral vascular disease) ([place]) 06/02/2012  . Type 2 diabetes mellitus ([place]) 05/17/2012  . Benign prostatic hyperplasia 11/16/2011  . AKI (acute kidney injury) ([place]) 08/26/2011  . Chronic kidney disease (CKD), stage III (moderate) ([place]) 08/26/2011  . Chronic coronary artery disease 03/25/2011  . Obstructive sleep apnea 09/15/2010  . Other specified extrapyramidal and movement disorders 09/15/2010  . PERSONAL HISTORY OF COLONIC POLYPS 10/25/2009  . Vitamin B-complex deficiency 03/04/2009  . Atrial flutter ([place]) 12/25/2008  . Hypertriglyceridemia 11/24/2006  . Essential hypertension 11/24/2006    Past Medical History:  Diagnosis Date  . Atrial flutter ([place])    s/p RFCA  . CAD (coronary artery disease)    cath 2003, occluded S-RCA, occluded S-Dx, L-LAD ok, s/p PTCA to LAD  . Cataract   . CKD (chronic kidney disease) stage 3, GFR 30-59 ml/min (HCC)   . Diabetes mellitus    diet controlled  . Hearing aid worn    bilateral  . History of kidney stones   . Long term (current) use of anticoagulants   . Neuromuscular disorder ([place])   . OSA (obstructive sleep apnea) 12/11   very mild, AHI 7/hr  . Persistent atrial fibrillation ([place]) 09/26/2015  . Pure hyperglyceridemia   . PVD (peripheral vascular disease) ([place])    angioplasty of his right lower extremity in [place] by Dr.[name] 2013  . Unspecified essential hypertension   . Wears dentures    full upper and lower    Relevant past medical, surgical, family and social history  reviewed and updated as indicated. Interim medical history since our last visit reviewed.  Review of Systems  Constitutional: Negative.  Negative for activity change, appetite change, fatigue and fever.  Gastrointestinal: Negative.  Negative for abdominal pain, diarrhea, nausea and vomiting.  Endocrine: Negative.   Genitourinary: Positive for decreased urine volume, difficulty urinating, dysuria, frequency and hematuria. Negative for enuresis, flank pain, genital sores, penile discharge, penile pain, penile swelling, scrotal swelling, testicular pain and urgency.  Skin: Negative.  Negative for rash.  Neurological: Negative.   Psychiatric/Behavioral: Negative.     Per HPI unless specifically indicated above     Objective:    BP 130/70   Pulse 60   Temp 97.9 F (36.6 C)   Ht 5\' 5"  (1.651 m)   Wt 176 lb 9.6 oz (80.1 kg)   SpO2 94%   BMI 29.39 kg/m   Wt Readings from Last 3 Encounters:  07/24/20 176 lb  9.6 oz (80.1 kg)  07/11/20 160 lb (72.6 kg)  04/19/20 168 lb 3.2 oz (76.3 kg)    Physical Exam Vitals and nursing note reviewed.  Constitutional:      General: He is not in acute distress.    Appearance: Normal appearance. He is not toxic-appearing.  Eyes:     General: No scleral icterus.    Extraocular Movements: Extraocular movements intact.     Conjunctiva/sclera: Conjunctivae normal.  Abdominal:     General: Abdomen is flat. Bowel sounds are normal. There is no distension.     Palpations: Abdomen is soft. There is no mass.     Tenderness: There is no abdominal tenderness. There is no right CVA tenderness or left CVA tenderness.  Skin:    General: Skin is warm and dry.     Capillary Refill: Capillary refill takes less than 2 seconds.     Coloration: Skin is not jaundiced or pale.     Findings: No erythema.  Neurological:     General: No focal deficit present.     Mental Status: He is alert and oriented to person, place, and time.  Psychiatric:        Mood and Affect:  Mood normal.        Behavior: Behavior normal.        Thought Content: Thought content normal.        Judgment: Judgment normal.        Assessment & Plan:   Problem List Items Addressed This Visit      Other   Hematuria - Primary    Acute on chronic, ongoing.  Appears patient has been seen in past for the same with Urologist but unable to locate office previous Urology visit notes today.  Also has a history of BPH.  Physical examination without evidence for kidney stones or acute abdomen today. UA showed 1+ bilirubin, 3+ blood, 3+ protein, and 1+ leukocytes.  Will send urine for culture and treat as UTI vs. ?prostatitis with ciprofloxacin in order to penetrate prostate.  Will contact Urologist for urgent appointment.  Will check CBC today, may consider addressing eliquis at future appointment.      Relevant Orders   Urinalysis, Routine w reflex microscopic (Completed)   Urine Culture   CBC with Differential       Follow up plan: Return if symptoms worsen or fail to improve.

## 2020-07-24 NOTE — Patient Instructions (Signed)
Hematuria, Adult Hematuria is blood in the urine. Blood may be visible in the urine, or it may be identified with a test. This condition can be caused by infections of the bladder, urethra, kidney, or prostate. Other possible causes include:  Kidney stones.  Cancer of the urinary tract.  Too much calcium in the urine.  Conditions that are passed from parent to child (inherited conditions).  Exercise that requires a lot of energy. Infections can usually be treated with medicine, and a kidney stone usually will pass through your urine. If neither of these is the cause of your hematuria, more tests may be needed to identify the cause of your symptoms. It is very important to tell your health care provider about any blood in your urine, even if it is painless or the blood stops without treatment. Blood in the urine, when it happens and then stops and then happens again, can be a symptom of a very serious condition, including cancer. There is no pain in the initial stages of many urinary cancers. Follow these instructions at home: Medicines  Take over-the-counter and prescription medicines only as told by your health care provider.  If you were prescribed an antibiotic medicine, take it as told by your health care provider. Do not stop taking the antibiotic even if you start to feel better. Eating and drinking  Drink enough fluid to keep your urine clear or pale yellow. It is recommended that you drink 3-4 quarts (2.8-3.8 L) a day. If you have been diagnosed with an infection, it is recommended that you drink cranberry juice in addition to large amounts of water.  Avoid caffeine, tea, and carbonated beverages. These tend to irritate the bladder.  Avoid alcohol because it may irritate the prostate (men). General instructions  If you have been diagnosed with a kidney stone, follow your health care provider's instructions about straining your urine to catch the stone.  Empty your bladder  often. Avoid holding urine for long periods of time.  If you are male: ? After a bowel movement, wipe from front to back and use each piece of toilet paper only once. ? Empty your bladder before and after sex.  Pay attention to any changes in your symptoms. Tell your health care provider about any changes or any new symptoms.  It is your responsibility to get your test results. Ask your health care provider, or the department performing the test, when your results will be ready.  Keep all follow-up visits as told by your health care provider. This is important. Contact a health care provider if:  You develop back pain.  You have a fever.  You have nausea or vomiting.  Your symptoms do not improve after 3 days.  Your symptoms get worse. Get help right away if:  You develop severe vomiting and are unable take medicine without vomiting.  You develop severe pain in your back or abdomen even though you are taking medicine.  You pass a large amount of blood in your urine.  You pass blood clots in your urine.  You feel very weak or like you might faint.  You faint. Summary  Hematuria is blood in the urine. It has many possible causes.  It is very important that you tell your health care provider about any blood in your urine, even if it is painless or the blood stops without treatment.  Take over-the-counter and prescription medicines only as told by your health care provider.  Drink enough fluid to keep   your urine clear or pale yellow. This information is not intended to replace advice given to you by your health care provider. Make sure you discuss any questions you have with your health care provider. Document Revised: 12/21/2018 Document Reviewed: 08/29/2016 Elsevier Patient Education  2020 Elsevier Inc.  

## 2020-07-24 NOTE — Assessment & Plan Note (Addendum)
Acute on chronic, ongoing.  Appears patient has been seen in past for the same with Urologist but unable to locate office previous Urology visit notes today.  Also has a history of BPH.  Physical examination without evidence for kidney stones or acute abdomen today. UA showed 1+ bilirubin, 3+ blood, 3+ protein, and 1+ leukocytes.  Will send urine for culture and treat as UTI vs. ?prostatitis with ciprofloxacin in order to penetrate prostate.  Will contact Urologist for urgent appointment.  Will check CBC today, may consider addressing eliquis at future appointment.

## 2020-07-25 DIAGNOSIS — N3021 Other chronic cystitis with hematuria: Secondary | ICD-10-CM | POA: Diagnosis not present

## 2020-07-26 LAB — URINE CULTURE
MICRO NUMBER:: 11321175
SPECIMEN QUALITY:: ADEQUATE

## 2020-07-31 ENCOUNTER — Other Ambulatory Visit: Payer: Self-pay

## 2020-07-31 DIAGNOSIS — R319 Hematuria, unspecified: Secondary | ICD-10-CM

## 2020-08-04 ENCOUNTER — Other Ambulatory Visit: Payer: Self-pay | Admitting: Family Medicine

## 2020-08-04 DIAGNOSIS — M7989 Other specified soft tissue disorders: Secondary | ICD-10-CM

## 2020-08-06 ENCOUNTER — Telehealth: Payer: Self-pay | Admitting: Pharmacist

## 2020-08-06 NOTE — Progress Notes (Signed)
Chronic Care Management Pharmacy Assistant   Name: Eric [name].  MRN: [id number] DOB: 1928/03/26  Reason for Encounter: Disease State for HTN  Patient Questions:  1.  Have you seen any other providers since your last visit? Yes.   2.  Any changes in your medicines or health? Yes.    PCP : [name], MD   Their chronic conditions include: HTN, Type II DM w/ CKD, Hypertriglyceridemia, Atrial flutter  Office Visits: 07/24/20 with [name], NP for hematuria. Started Ciprofloxacin 250 mg   Consults: None since 07/22/20  Allergies:   Allergies  Allergen Reactions  . Isosorbide Mononitrate Other (See Comments)    Unknown, doesn't remember   . Ramipril Cough and Other (See Comments)  . Quinidine Gluconate Rash  . Quinidine Gluconate Other (See Comments) and Rash    Medications: Outpatient Encounter Medications as of 08/06/2020  Medication Sig  . acetaminophen (TYLENOL) 325 MG tablet Take 2 tablets (650 mg total) by mouth every 6 (six) hours as needed for mild pain (or temp >/= 101 F).  [name] amLODipine (NORVASC) 10 MG tablet Take 1 tablet (10 mg total) by mouth daily.  [name] apixaban (ELIQUIS) 5 MG TABS tablet Take 1 tablet (5 mg total) by mouth 2 (two) times daily.  [name] atenolol (TENORMIN) 25 MG tablet Take 0.5 tablets (12.5 mg total) by mouth daily.  . B Complex Vitamins (B COMPLEX-B12) TABS Take 1 tablet by mouth daily.   . cholecalciferol (VITAMIN D) 1000 UNITS tablet Take 1,000 Units by mouth 2 (two) times a week. Takes on Mon and Fri  . Cinnamon 500 MG capsule Take 1,000 mg by mouth 2 (two) times daily.  . ciprofloxacin (CIPRO) 250 MG tablet Take 1 tablet (250 mg total) by mouth 2 (two) times daily.  . ferrous sulfate 325 (65 FE) MG EC tablet Take 325 mg by mouth 3 (three) times daily with meals.  . finasteride (PROSCAR) 5 MG tablet TAKE 1 TABLET BY MOUTH ONCE DAILY  . fish oil-omega-3 fatty acids 1000 MG capsule Take 1,000 mg by mouth daily.  .  furosemide (LASIX) 40 MG tablet TAKE 1 TABLET BY MOUTH ONCE DAILY AS NEEDED FOR  LEG  SWELLING  . hydrALAZINE (APRESOLINE) 25 MG tablet TAKE 3 TABLETS BY MOUTH EVERY 8 HOURS (Patient taking differently: Take 75 mg by mouth every 8 (eight) hours.)  . lisinopril (PRINIVIL,ZESTRIL) 20 MG tablet Take 1 tablet (20 mg total) by mouth daily.  [name] lisinopril (ZESTRIL) 40 MG tablet Take 1 tablet by mouth once daily  . Multiple Vitamin (DAILY MULTIVITAMIN PO) Take 1 tablet by mouth daily.  [name] ULTRA test strip USE 1 STRIP TO CHECK GLUCOSE ONCE DAILY  . pentoxifylline (TRENTAL) 400 MG CR tablet TAKE 1 TABLET BY MOUTH EVERY 12 HOURS  . rosuvastatin (CRESTOR) 40 MG tablet TAKE 1 TABLET BY MOUTH ONCE DAILY (CALL  CLINIC  TO  SCHEDULE  AN  APPOINTMENT  FOR  FUTURE  REFILLS)  . saccharomyces boulardii (FLORASTOR) 250 MG capsule Take 1 capsule (250 mg total) by mouth daily.  . tamsulosin (FLOMAX) 0.4 MG CAPS capsule Take 1 capsule by mouth once daily   No facility-administered encounter medications on file as of 08/06/2020.    Current Diagnosis: Patient Active Problem List   Diagnosis Date Noted  . Hematuria 07/24/2020  . Hypertensive urgency 03/08/2019  . Hx of BKA, left (HCC) 01/13/2018  . Nausea and vomiting in adult 12/26/2017  . Pressure injury  of skin 12/23/2017  . C. difficile colitis 12/22/2017  . UTI (urinary tract infection) 12/22/2017  . Hyponatremia 12/22/2017  . Normocytic anemia 12/22/2017  . ASO (arteriosclerosis obliterans) 11/23/2017  . Chronic anticoagulation 09/26/2015  . History of anticoagulant therapy 09/26/2015  . PVD (peripheral vascular disease) ([place]) 06/02/2012  . Type 2 diabetes mellitus ([place]) 05/17/2012  . Benign prostatic hyperplasia 11/16/2011  . AKI (acute kidney injury) ([place]) 08/26/2011  . Chronic kidney disease (CKD), stage III (moderate) ([place]) 08/26/2011  . Chronic coronary artery disease 03/25/2011  . Obstructive sleep apnea 09/15/2010  . Other specified  extrapyramidal and movement disorders 09/15/2010  . PERSONAL HISTORY OF COLONIC POLYPS 10/25/2009  . Vitamin B-complex deficiency 03/04/2009  . Atrial flutter ([place]) 12/25/2008  . Hypertriglyceridemia 11/24/2006  . Essential hypertension 11/24/2006    Goals Addressed   None    Reviewed chart prior to disease state call. Spoke with patient regarding BP  Recent Office Vitals: BP Readings from Last 3 Encounters:  07/24/20 130/70  07/11/20 (!) 142/68  04/19/20 (!) 122/54   Pulse Readings from Last 3 Encounters:  07/24/20 60  07/11/20 60  04/19/20 (!) 47    Wt Readings from Last 3 Encounters:  07/24/20 176 lb 9.6 oz (80.1 kg)  07/11/20 160 lb (72.6 kg)  04/19/20 168 lb 3.2 oz (76.3 kg)     Kidney Function Lab Results  Component Value Date/Time   CREATININE 1.12 07/11/2020 03:09 PM   CREATININE 1.26 (H) 02/22/2020 10:20 AM   CREATININE 1.76 (H) 10/12/2019 02:44 PM   GFR 48.95 (L) 10/10/2013 08:59 AM   GFRNONAA >60 07/11/2020 03:09 PM   GFRNONAA 50 (L) 02/22/2020 10:20 AM   GFRAA 58 (L) 02/22/2020 10:20 AM    BMP Latest Ref Rng & Units 07/11/2020 02/22/2020 10/12/2019  Glucose 70 - 99 mg/dL 86 95 74  BUN 8 - 23 mg/dL 21 18 33(H)  Creatinine 0.61 - 1.24 mg/dL 1.12 1.26(H) 1.76(H)  BUN/Creat Ratio 6 - 22 (calc) - 14 19  Sodium 135 - 145 mmol/L 134(L) 139 138  Potassium 3.5 - 5.1 mmol/L 4.6 4.0 5.6(H)  Chloride 98 - 111 mmol/L 106 106 104  CO2 22 - 32 mmol/L 23 25 25   Calcium 8.9 - 10.3 mg/dL 8.4(L) 8.9 9.6    . Current antihypertensive regimen:   Amlodipine 10mg   Atenolol 25mg  one-half tablet daily  Lisinopril  40mg   Hydralazine 25mg  three tablets three times daily  . How often are you checking your Blood Pressure? twice daily   . Current home BP readings: 08/06/20 139/56        08/06/20 165/79        08/06/20 144/67                  08/05/20 129/69                                                          08/05/20 143/51                                                          08/04/20 146/61  08/04/20 147/68 . What recent interventions/DTPs have been made by any provider to improve Blood Pressure control since last CPP Visit: None.  . Any recent hospitalizations or ED visits since last visit with CPP? No  . What diet changes have been made to improve Blood Pressure Control?  o Patient stated he is trying to drink more water and cranberry juice.  . What exercise is being done to improve your Blood Pressure Control?  o Patient stated he walks to the shop and back, also around the house.  Adherence Review: Is the patient currently on ACE/ARB medication? Yes, Lisinopril 40 mg  Does the patient have >5 day gap between last estimated fill dates? No, CPP Please Check.    Follow-Up:  Pharmacist Review   Hulen Luster, RMA Clinical Pharmacist Assistant [phone]

## 2020-08-08 ENCOUNTER — Ambulatory Visit: Payer: Self-pay | Admitting: Pharmacist

## 2020-08-08 NOTE — Chronic Care Management (AMB) (Signed)
Chronic Care Management   Follow Up Note   08/08/2020 Name: Eric [name]. MRN: [id number] DOB: 02/10/1928  Referred by: [name], MD Reason for referral : No chief complaint on file.   Eric [name]. is a 86 y.o. year old male who is a primary care patient of Pickard, Cammie Mcgee, MD. The CCM team was consulted for assistance with chronic disease management and care coordination needs.    Review of patient status, including review of consultants reports, relevant laboratory and other test results, and collaboration with appropriate care team members and the patient's provider was performed as part of comprehensive patient evaluation and provision of chronic care management services.    SDOH (Social Determinants of Health) assessments performed: No See Care Plan activities for detailed interventions related to [facility])     Outpatient Encounter Medications as of 08/08/2020  Medication Sig  . acetaminophen (TYLENOL) 325 MG tablet Take 2 tablets (650 mg total) by mouth every 6 (six) hours as needed for mild pain (or temp >/= 101 F).  [name] amLODipine (NORVASC) 10 MG tablet Take 1 tablet (10 mg total) by mouth daily.  [name] apixaban (ELIQUIS) 5 MG TABS tablet Take 1 tablet (5 mg total) by mouth 2 (two) times daily.  [name] atenolol (TENORMIN) 25 MG tablet Take 0.5 tablets (12.5 mg total) by mouth daily.  . B Complex Vitamins (B COMPLEX-B12) TABS Take 1 tablet by mouth daily.   . cholecalciferol (VITAMIN D) 1000 UNITS tablet Take 1,000 Units by mouth 2 (two) times a week. Takes on Mon and Fri  . Cinnamon 500 MG capsule Take 1,000 mg by mouth 2 (two) times daily.  . ciprofloxacin (CIPRO) 250 MG tablet Take 1 tablet (250 mg total) by mouth 2 (two) times daily.  . ferrous sulfate 325 (65 FE) MG EC tablet Take 325 mg by mouth 3 (three) times daily with meals.  . finasteride (PROSCAR) 5 MG tablet TAKE 1 TABLET BY MOUTH ONCE DAILY  . fish oil-omega-3 fatty acids 1000 MG capsule Take 1,000 mg by  mouth daily.  . furosemide (LASIX) 40 MG tablet TAKE 1 TABLET BY MOUTH ONCE DAILY AS NEEDED FOR  LEG  SWELLING  . hydrALAZINE (APRESOLINE) 25 MG tablet TAKE 3 TABLETS BY MOUTH EVERY 8 HOURS (Patient taking differently: Take 75 mg by mouth every 8 (eight) hours.)  . lisinopril (PRINIVIL,ZESTRIL) 20 MG tablet Take 1 tablet (20 mg total) by mouth daily.  [name] lisinopril (ZESTRIL) 40 MG tablet Take 1 tablet by mouth once daily  . Multiple Vitamin (DAILY MULTIVITAMIN PO) Take 1 tablet by mouth daily.  [name] ULTRA test strip USE 1 STRIP TO CHECK GLUCOSE ONCE DAILY  . pentoxifylline (TRENTAL) 400 MG CR tablet TAKE 1 TABLET BY MOUTH EVERY 12 HOURS  . rosuvastatin (CRESTOR) 40 MG tablet TAKE 1 TABLET BY MOUTH ONCE DAILY (CALL  CLINIC  TO  SCHEDULE  AN  APPOINTMENT  FOR  FUTURE  REFILLS)  . saccharomyces boulardii (FLORASTOR) 250 MG capsule Take 1 capsule (250 mg total) by mouth daily.  . tamsulosin (FLOMAX) 0.4 MG CAPS capsule Take 1 capsule by mouth once daily   No facility-administered encounter medications on file as of 08/08/2020.     Objective:  Reviewed chart for care gaps and analyzed adherence information for medicare.  Goals Addressed   None     There are no care plans to display for this patient. Care gaps: No AWV for 2021  Met: Cholesterol compliance 100%,  HTN compliance 90-99%.  Adherence:  Lisinopril 5m - patient 94.5% adherent to lisinopril  Rosuvastatin 454m- patient 100% adherent to rosuvastatin  Plan:   The patient has been provided with contact information for the care management team and has been advised to call with any health related questions or concerns.    ChBeverly MilchPharmD Clinical Pharmacist [phone]

## 2020-08-13 ENCOUNTER — Other Ambulatory Visit: Payer: Self-pay | Admitting: Nurse Practitioner

## 2020-08-13 ENCOUNTER — Encounter: Payer: Self-pay | Admitting: Family Medicine

## 2020-08-13 ENCOUNTER — Other Ambulatory Visit: Payer: Self-pay | Admitting: Family Medicine

## 2020-08-14 ENCOUNTER — Other Ambulatory Visit: Payer: Self-pay

## 2020-08-14 ENCOUNTER — Ambulatory Visit (INDEPENDENT_AMBULATORY_CARE_PROVIDER_SITE_OTHER): Payer: PPO | Admitting: Urology

## 2020-08-14 VITALS — BP 138/61 | HR 59 | Ht 65.0 in | Wt 172.0 lb

## 2020-08-14 DIAGNOSIS — R319 Hematuria, unspecified: Secondary | ICD-10-CM

## 2020-08-14 DIAGNOSIS — N390 Urinary tract infection, site not specified: Secondary | ICD-10-CM

## 2020-08-14 DIAGNOSIS — R310 Gross hematuria: Secondary | ICD-10-CM

## 2020-08-14 DIAGNOSIS — N138 Other obstructive and reflux uropathy: Secondary | ICD-10-CM | POA: Diagnosis not present

## 2020-08-14 DIAGNOSIS — N401 Enlarged prostate with lower urinary tract symptoms: Secondary | ICD-10-CM

## 2020-08-14 MED ORDER — PENTOXIFYLLINE ER 400 MG PO TBCR
400.0000 mg | EXTENDED_RELEASE_TABLET | Freq: Two times a day (BID) | ORAL | 0 refills | Status: DC
Start: 2020-08-14 — End: 2021-01-20

## 2020-08-14 NOTE — Progress Notes (Signed)
08/14/2020 2:32 PM   Eric [name]. [birth year] [id number]  Referring provider: [name], MD [address] [facility] [place],  [place] [address]  Chief Complaint  Patient presents with  . Hematuria    HPI: 87 year old male who presents today for further evaluation of hematuria.  He was seen and evaluated in the emergency room on 07/11/2020.  At that time, he noted to have blood in his urine.  He has had a similar episode a year prior and was possibly seen by urologist in [place] although unable to locate these notes.  Urinalysis in the emergency room at that time showed nitrate positive, few bacteria with moderate leukocytes.  He was diagnosed and treated for UTI, given Keflex 500 mg 4 times a day.  No urine culture was sent from the emergency room.  He was seen in follow-up by his PCP.  At that time, urine culture was sent and growing Pseudomonas, pansensitive.  His urine remained grossly positive.  He was prescribed Cipro on this occasion.  He does have multiple medical issues as outlined below.  He is on chronic Eliquis.  He is also on Flomax.  Most recent cross-sectional imaging the form of CT abdomen pelvis with contrast on 06/16/2019 which shows a large left bladder diverticulum measuring 2.3 cm with associated stranding and wall thickening.  He also had trabeculated appearing bladder.  He reports today that he has been seeing Dr. Jeffie Pollock on a fairly frequent basis last a couple months ago.  He requested to see a urologist in network.  Records from Dr. Ralene Muskrat office have been requested today.  He reports he underwent cystoscopy in the recent past and also had some sort of fulguration type procedure based on his description where something was "burned".  Today, he reports he has not had any recent gross hematuria.  He feels like his urinary symptoms are fairly stable.  He was unable to urinate today in our office thus was catheterized for specimen.  It did show  greater than 30 red blood cells per high-power field, 6-10 white blood cells, nitrate negative with moderate bacteria.  Unable to urinate here in the office, bladder scan indicates he is greater than 200 and is not able to void.  He was catheterized for specimen today.   PMH: Past Medical History:  Diagnosis Date  . Atrial flutter ([place])    s/p RFCA  . CAD (coronary artery disease)    cath 2003, occluded S-RCA, occluded S-Dx, L-LAD ok, s/p PTCA to LAD  . Cataract   . CKD (chronic kidney disease) stage 3, GFR 30-59 ml/min (HCC)   . Diabetes mellitus    diet controlled  . Hearing aid worn    bilateral  . History of kidney stones   . Long term (current) use of anticoagulants   . Neuromuscular disorder ([place])   . OSA (obstructive sleep apnea) 12/11   very mild, AHI 7/hr  . Persistent atrial fibrillation ([place]) 09/26/2015  . Pure hyperglyceridemia   . PVD (peripheral vascular disease) ([place])    angioplasty of his right lower extremity in [place] by Dr.[name] 2013  . Unspecified essential hypertension   . Wears dentures    full upper and lower    Surgical History: Past Surgical History:  Procedure Laterality Date  . ABDOMINAL AORTOGRAM W/LOWER EXTREMITY N/A 11/15/2017   Procedure: ABDOMINAL AORTOGRAM W/LOWER EXTREMITY;  Surgeon: [name], MD;  Location: [place] [facility];  Service: Cardiovascular;  Laterality: N/A;  .  Adenosine Myoview  3/06   EF 56%, neg. Ischemia  . Adenosine Myoview  02/18/07   nml  . AMPUTATION Left 12/15/2017   Procedure: AMPUTATION BELOW KNEE;  Surgeon: Annice Needy, MD;  Location: [facility];  Service: Vascular;  Laterality: Left;  . ANGIOPLASTY  1/99   CAD- diogonal with rotational artherectomy  . Arthrectomy     of LAD & PTCA  . BLEPHAROPLASTY Bilateral   . CARDIAC CATHETERIZATION  1/00  . CARDIOVERSION  1/04  . CARDIOVERSION  5/07   hospital- a flutter  . CARPAL TUNNEL RELEASE     ? bilateral  . CATARACT EXTRACTION W/PHACO Right 07/28/2017    Procedure: CATARACT EXTRACTION PHACO AND INTRAOCULAR LENS PLACEMENT (IOC) RIGHT DIABETIC;  Surgeon: Lockie Mola, MD;  Location: [facility];  Service: Ophthalmology;  Laterality: Right;  Diabetic - diet controlled  . CATARACT EXTRACTION W/PHACO Left 08/18/2017   Procedure: CATARACT EXTRACTION PHACO AND INTRAOCULAR LENS PLACEMENT (IOC);  Surgeon: Lockie Mola, MD;  Location: [facility];  Service: Ophthalmology;  Laterality: Left;  DIABETES - oral meds  . COLONOSCOPY W/ BIOPSIES  10/01/06   sigmoid polyp bx neg, 3 years  . CORONARY ANGIOPLASTY  4/03   cutting balloon PTCA pLAD into Diag  . CORONARY ARTERY BYPASS GRAFT  2000   LIMA-LAD, SVG-RCA, SVG-Diag; SVG-Diag & SVG-RCA occluded 2003  . FRACTURE SURGERY    . HAND SURGERY  08/27/09   R thumb procedure wit Scaphoid Gragt and screws, Dr Merlyn Lot  . LOWER EXTREMITY ANGIOGRAPHY Right 01/25/2019   Procedure: LOWER EXTREMITY ANGIOGRAPHY;  Surgeon: Renford Dills, MD;  Location: [facility];  Service: Cardiovascular;  Laterality: Right;  . NM MYOVIEW LTD  4/11   normal  . PERIPHERAL VASCULAR CATHETERIZATION Right 06/27/2015   Procedure: Lower Extremity Angiography;  Surgeon: Annice Needy, MD;  Location: [facility];  Service: Cardiovascular;  Laterality: Right;  . PERIPHERAL VASCULAR CATHETERIZATION  06/27/2015   Procedure: Lower Extremity Intervention;  Surgeon: Annice Needy, MD;  Location: [facility];  Service: Cardiovascular;;    Home Medications:  Allergies as of 08/14/2020      Reactions   Isosorbide Mononitrate Other (See Comments)   Unknown, doesn't remember    Ramipril Cough, Other (See Comments)   Quinidine Gluconate Rash   Quinidine Gluconate Other (See Comments), Rash      Medication List       Accurate as of August 14, 2020  2:32 PM. If you have any questions, ask your nurse or doctor.        acetaminophen 325 MG tablet Commonly known as: TYLENOL Take 2 tablets (650  mg total) by mouth every 6 (six) hours as needed for mild pain (or temp >/= 101 F).   amLODipine 10 MG tablet Commonly known as: NORVASC Take 1 tablet (10 mg total) by mouth daily.   apixaban 5 MG Tabs tablet Commonly known as: ELIQUIS Take 1 tablet (5 mg total) by mouth 2 (two) times daily.   atenolol 25 MG tablet Commonly known as: TENORMIN Take 0.5 tablets (12.5 mg total) by mouth daily.   B Complex-B12 Tabs Take 1 tablet by mouth daily.   cholecalciferol 1000 units tablet Commonly known as: VITAMIN D Take 1,000 Units by mouth 2 (two) times a week. Takes on Mon and Fri   Cinnamon 500 MG capsule Take 1,000 mg by mouth 2 (two) times daily.   ciprofloxacin 250 MG tablet Commonly known as: Cipro  Take 1 tablet (250 mg total) by mouth 2 (two) times daily.   DAILY MULTIVITAMIN PO Take 1 tablet by mouth daily.   ferrous sulfate 325 (65 FE) MG EC tablet Take 325 mg by mouth 3 (three) times daily with meals.   finasteride 5 MG tablet Commonly known as: PROSCAR TAKE 1 TABLET BY MOUTH ONCE DAILY   fish oil-omega-3 fatty acids 1000 MG capsule Take 1,000 mg by mouth daily.   furosemide 40 MG tablet Commonly known as: LASIX TAKE 1 TABLET BY MOUTH ONCE DAILY AS NEEDED FOR  LEG  SWELLING   hydrALAZINE 25 MG tablet Commonly known as: APRESOLINE TAKE 3 TABLETS BY MOUTH EVERY 8 HOURS What changed: See the new instructions.   lisinopril 20 MG tablet Commonly known as: ZESTRIL Take 1 tablet (20 mg total) by mouth daily.   lisinopril 40 MG tablet Commonly known as: ZESTRIL Take 1 tablet by mouth once daily   OneTouch Ultra test strip Generic drug: glucose blood USE 1 STRIP TO CHECK GLUCOSE ONCE DAILY   pentoxifylline 400 MG CR tablet Commonly known as: TRENTAL Take 1 tablet (400 mg total) by mouth every 12 (twelve) hours.   rosuvastatin 40 MG tablet Commonly known as: CRESTOR TAKE 1 TABLET BY MOUTH ONCE DAILY (CALL  CLINIC  TO  SCHEDULE  AN  APPOINTMENT  FOR  FUTURE   REFILLS)   saccharomyces boulardii 250 MG capsule Commonly known as: Florastor Take 1 capsule (250 mg total) by mouth daily.   tamsulosin 0.4 MG Caps capsule Commonly known as: FLOMAX Take 1 capsule by mouth once daily       Allergies:  Allergies  Allergen Reactions  . Isosorbide Mononitrate Other (See Comments)    Unknown, doesn't remember   . Ramipril Cough and Other (See Comments)  . Quinidine Gluconate Rash  . Quinidine Gluconate Other (See Comments) and Rash    Family History: Family History  Problem Relation Age of Onset  . Stroke Father   . Aneurysm Father   . Hypertension Father   . Prostate cancer Brother   . Hypertension Mother   . Lung cancer Brother        smoker  . Thrombosis Brother   . Other Brother        RF valve disorder (smoker)  . Lung cancer Brother        smoker  . Breast cancer Sister   . Liver cancer Brother   . Other Sister        cerebral hemorrhage    Social History:  reports that he has never smoked. He quit smokeless tobacco use about 28 years ago. He reports that he does not drink alcohol and does not use drugs.   Physical Exam: There were no vitals taken for this visit.  Constitutional:  Alert and oriented, No acute distress. HEENT: [place] AT, moist mucus membranes.  Trachea midline, no masses. Cardiovascular: No clubbing, cyanosis, or edema. Respiratory: Normal respiratory effort, no increased work of breathing. GI: Abdomen is soft, nontender, nondistended, no abdominal masses GU: No CVA tenderness Lymph: No cervical or inguinal lymphadenopathy. Skin: No rashes, bruises or suspicious lesions. Neurologic: Grossly intact, no focal deficits, moving all 4 extremities. Psychiatric: Normal mood and affect.  Laboratory Data: Lab Results  Component Value Date   WBC 10.5 07/24/2020   HGB 9.8 (L) 07/24/2020   HCT 29.7 (L) 07/24/2020   MCV 92.8 07/24/2020   PLT 162 07/24/2020    Lab Results  Component Value Date  CREATININE  1.12 07/11/2020    Lab Results  Component Value Date   PSA 0.70 12/16/2015   PSA 0.40 04/30/2015   PSA 0.60 09/20/2009    Results for orders placed or performed in visit on 07/24/20  Urine Culture   Specimen: Urine  Result Value Ref Range   MICRO NUMBER: [id number]    SPECIMEN QUALITY: Adequate    Sample Source NOT GIVEN    STATUS: FINAL    ISOLATE 1: Pseudomonas aeruginosa (A)       Susceptibility   Pseudomonas aeruginosa - URINE CULTURE, REFLEX    CEFTAZIDIME 4 Sensitive     CEFEPIME 2 Sensitive     CIPROFLOXACIN <=0.25 Sensitive     LEVOFLOXACIN 0.5 Sensitive     GENTAMICIN <=1 Sensitive     IMIPENEM 2 Sensitive     PIP/TAZO 16 Sensitive     TOBRAMYCIN* <=1 Sensitive      * Legend:S = Susceptible  I = IntermediateR = Resistant  NS = Not susceptible* = Not tested  NR = Not reported**NN = See antimicrobic comments  Urinalysis, Routine w reflex microscopic  Result Value Ref Range   Color, Urine BROWN (A) YELLOW   APPearance CLOUDY (A) CLEAR   Specific Gravity, Urine 1.015 1.001 - 1.03   pH 5.5 5.0 - 8.0   Glucose, UA NEGATIVE NEGATIVE   Bilirubin Urine NEGATIVE NEGATIVE   Ketones, ur NEGATIVE NEGATIVE   Hgb urine dipstick 3+ (A) NEGATIVE   Protein, ur 3+ (A) NEGATIVE   Nitrite NEGATIVE NEGATIVE   Leukocytes,Ua 1+ (A) NEGATIVE   WBC, UA 6-10 (A) 0 - 5 /HPF   RBC / HPF > OR = 60 (A) 0 - 2 /HPF   Squamous Epithelial / LPF NONE SEEN < OR = 5 /HPF   Bacteria, UA NONE SEEN NONE SEEN /HPF   Hyaline Cast NONE SEEN NONE SEEN /LPF  CBC with Differential  Result Value Ref Range   WBC 10.5 3.8 - 10.8 Thousand/uL   RBC 3.20 (L) 4.20 - 5.80 Million/uL   Hemoglobin 9.8 (L) 13.2 - 17.1 g/dL   HCT 29.7 (L) 38.5 - 50.0 %   MCV 92.8 80.0 - 100.0 fL   MCH 30.6 27.0 - 33.0 pg   MCHC 33.0 32.0 - 36.0 g/dL   RDW 13.7 11.0 - 15.0 %   Platelets 162 140 - 400 Thousand/uL   MPV 10.3 7.5 - 12.5 fL   Neutro Abs 5,366 1,500 - 7,800 cells/uL   Lymphs Abs 3,959 (H) 850 - 3,900 cells/uL    Absolute Monocytes 830 200 - 950 cells/uL   Eosinophils Absolute 242 15 - 500 cells/uL   Basophils Absolute 105 0 - 200 cells/uL   Neutrophils Relative % 51.1 %   Total Lymphocyte 37.7 %   Monocytes Relative 7.9 %   Eosinophils Relative 2.3 %   Basophils Relative 1.0 %  Microscopic Message  Result Value Ref Range   Note      Assessment & Plan:    1. Gross hematuria Records from Dr. Ralene Muskrat office requested, sounds like he is undergone cystoscopy in the recent past thus will hold off on further intervention at this time until we are able to review those  No active bleeding currently  May be related to recurrent infections versus prostamegaly in the setting of anticoagulation however high risk for stop these medications thus recommend continuing for the time being - Urinalysis, Complete - CULTURE, URINE COMPREHENSIVE   2. BPH with urinary obstruction  Chronically on Flomax with sequela of chronic outlet obstruction based on previous CT scans  Continue this medication   - Urinalysis, Complete - CULTURE, URINE COMPREHENSIVE  3. Recurrent UTI Several recurrent Pseudomonas infections question chronic bacterial colonization versus true infection  His symptoms are relatively stable and not necessarily consistent with UTI today.  Urinalysis is moderately suspicious.  We will send culture and treat if he becomes more symptomatic.  Follow-up in 1 month for records review, return sooner if he develops any further or more severe gross hematuria.   Hollice Espy, MD  [facility] [address], [place], [place] [address] [phone]

## 2020-08-14 NOTE — Progress Notes (Signed)
In and Out Catheterization  Patient is present today for a I & O catheterization due to needing urinalysis. Patient was cleaned and prepped in a sterile fashion with betadine . A 14FR cath was inserted no complications were noted , of urine return was noted, urine was brown in color. A clean urine sample was collected for urinalysis. Bladder was drained  And catheter was removed with out difficulty.    Performed by: Franchot Erichsen, CMA

## 2020-08-15 LAB — URINALYSIS, COMPLETE
Bilirubin, UA: NEGATIVE
Glucose, UA: NEGATIVE
Ketones, UA: NEGATIVE
Leukocytes,UA: NEGATIVE
Nitrite, UA: NEGATIVE
Specific Gravity, UA: 1.025 (ref 1.005–1.030)
Urobilinogen, Ur: 0.2 mg/dL (ref 0.2–1.0)
pH, UA: 5.5 (ref 5.0–7.5)

## 2020-08-15 LAB — MICROSCOPIC EXAMINATION: RBC, Urine: >30 /hpf — AB (ref 0–2)

## 2020-08-16 LAB — CULTURE, URINE COMPREHENSIVE

## 2020-09-05 ENCOUNTER — Encounter: Payer: Self-pay | Admitting: Family Medicine

## 2020-09-05 ENCOUNTER — Other Ambulatory Visit: Payer: Self-pay | Admitting: Family Medicine

## 2020-09-11 ENCOUNTER — Ambulatory Visit (INDEPENDENT_AMBULATORY_CARE_PROVIDER_SITE_OTHER): Payer: PPO | Admitting: Urology

## 2020-09-11 ENCOUNTER — Other Ambulatory Visit: Payer: Self-pay

## 2020-09-11 VITALS — BP 143/66 | HR 49 | Ht 65.0 in | Wt 165.0 lb

## 2020-09-11 DIAGNOSIS — Z87448 Personal history of other diseases of urinary system: Secondary | ICD-10-CM

## 2020-09-11 DIAGNOSIS — N138 Other obstructive and reflux uropathy: Secondary | ICD-10-CM

## 2020-09-11 DIAGNOSIS — N390 Urinary tract infection, site not specified: Secondary | ICD-10-CM | POA: Diagnosis not present

## 2020-09-11 DIAGNOSIS — N401 Enlarged prostate with lower urinary tract symptoms: Secondary | ICD-10-CM

## 2020-09-11 DIAGNOSIS — Z87898 Personal history of other specified conditions: Secondary | ICD-10-CM

## 2020-09-11 NOTE — Progress Notes (Signed)
09/11/2020 11:35 AM   Eric [name]. Dec 29, 1927 [id number]  Referring provider: [name], MD [address] [facility] [place],  [place] [address]  Chief Complaint  Patient presents with  . Hematuria  . Follow-up    HPI: 87 year old male who returns today with records from [facility].   He initially presented with gross hematuria and is seeking to transfer care.  He has a known history of prostamegaly, bladder diverticulum and recurrent gross hematuria.  Review of records today indicate that he did in fact have a cystoscopy on 08/21/2019 with by Dr. Jeffie Pollock that showed a left lateral wall diverticulum with chronic inflammatory changes.  The repeat urine culture on that particular day was negative along with a urine cytology.  He is chronically on Flomax and finasteride.  He was seen in follow-up in just July 25, 2020 for exacerbation of his gross hematuria.  Plan for follow-up on that occasion involved adjustment of medications as he typically grows Pseudomonas.  He is scheduled to follow-up in 6 months with a PVR.  Today, he denies any ongoing gross hematuria.  He is not had an episode since December.  He has chronic obstructive urinary symptoms including weak stream and some emptying issues but otherwise things are relatively stable.  He denies any burning with urination today.   PMH: Past Medical History:  Diagnosis Date  . Atrial flutter ([place])    s/p RFCA  . CAD (coronary artery disease)    cath 2003, occluded S-RCA, occluded S-Dx, L-LAD ok, s/p PTCA to LAD  . Cataract   . CKD (chronic kidney disease) stage 3, GFR 30-59 ml/min (HCC)   . Diabetes mellitus    diet controlled  . Hearing aid worn    bilateral  . History of kidney stones   . Long term (current) use of anticoagulants   . Neuromuscular disorder ([place])   . OSA (obstructive sleep apnea) 12/11   very mild, AHI 7/hr  . Persistent atrial fibrillation ([place]) 09/26/2015  . Pure  hyperglyceridemia   . PVD (peripheral vascular disease) ([place])    angioplasty of his right lower extremity in [place] by Dr.[name] 2013  . Unspecified essential hypertension   . Wears dentures    full upper and lower    Surgical History: Past Surgical History:  Procedure Laterality Date  . ABDOMINAL AORTOGRAM W/LOWER EXTREMITY N/A 11/15/2017   Procedure: ABDOMINAL AORTOGRAM W/LOWER EXTREMITY;  Surgeon: [name], MD;  Location: [place] [facility];  Service: Cardiovascular;  Laterality: N/A;  . Adenosine Myoview  3/06   EF 56%, neg. Ischemia  . Adenosine Myoview  02/18/07   nml  . AMPUTATION Left 12/15/2017   Procedure: AMPUTATION BELOW KNEE;  Surgeon: Algernon [name], MD;  Location: [facility];  Service: Vascular;  Laterality: Left;  . ANGIOPLASTY  1/99   CAD- diogonal with rotational artherectomy  . Arthrectomy     of LAD & PTCA  . BLEPHAROPLASTY Bilateral   . CARDIAC CATHETERIZATION  1/00  . CARDIOVERSION  1/04  . CARDIOVERSION  5/07   hospital- a flutter  . CARPAL TUNNEL RELEASE     ? bilateral  . CATARACT EXTRACTION W/PHACO Right 07/28/2017   Procedure: CATARACT EXTRACTION PHACO AND INTRAOCULAR LENS PLACEMENT ([place]) RIGHT DIABETIC;  Surgeon: [name], MD;  Location: [place];  Service: Ophthalmology;  Laterality: Right;  Diabetic - diet controlled  . CATARACT EXTRACTION W/PHACO Left 08/18/2017   Procedure: CATARACT EXTRACTION PHACO AND INTRAOCULAR LENS  PLACEMENT (IOC);  Surgeon: [name], MD;  Location: [place];  Service: Ophthalmology;  Laterality: Left;  DIABETES - oral meds  . COLONOSCOPY W/ BIOPSIES  10/01/06   sigmoid polyp bx neg, 3 years  . CORONARY ANGIOPLASTY  4/03   cutting balloon PTCA pLAD into Diag  . CORONARY ARTERY BYPASS GRAFT  2000   LIMA-LAD, SVG-RCA, SVG-Diag; SVG-Diag & SVG-RCA occluded 2003  . FRACTURE SURGERY    . HAND SURGERY  08/27/09   R thumb procedure wit Scaphoid Gragt and screws, Dr [name]  . LOWER  EXTREMITY ANGIOGRAPHY Right 01/25/2019   Procedure: LOWER EXTREMITY ANGIOGRAPHY;  Surgeon: Katha Cabal, MD;  Location: [place] [facility];  Service: Cardiovascular;  Laterality: Right;  . NM MYOVIEW LTD  4/11   normal  . PERIPHERAL VASCULAR CATHETERIZATION Right 06/27/2015   Procedure: Lower Extremity Angiography;  Surgeon: Algernon [name], MD;  Location: [place] [facility];  Service: Cardiovascular;  Laterality: Right;  . PERIPHERAL VASCULAR CATHETERIZATION  06/27/2015   Procedure: Lower Extremity Intervention;  Surgeon: Algernon [name], MD;  Location: [place] [facility];  Service: Cardiovascular;;    Home Medications:  Allergies as of 09/11/2020      Reactions   Isosorbide Mononitrate Other (See Comments)   Unknown, doesn't remember    Ramipril Cough, Other (See Comments)   Quinidine Gluconate Rash   Quinidine Gluconate Other (See Comments), Rash      Medication List       Accurate as of September 11, 2020 11:35 AM. If you have any questions, ask your nurse or doctor.        acetaminophen 325 MG tablet Commonly known as: TYLENOL Take 2 tablets (650 mg total) by mouth every 6 (six) hours as needed for mild pain (or temp >/= 101 F).   amLODipine 10 MG tablet Commonly known as: NORVASC Take 1 tablet (10 mg total) by mouth daily.   apixaban 5 MG Tabs tablet Commonly known as: ELIQUIS Take 1 tablet (5 mg total) by mouth 2 (two) times daily.   atenolol 25 MG tablet Commonly known as: TENORMIN Take 0.5 tablets (12.5 mg total) by mouth daily.   B Complex-B12 Tabs Take 1 tablet by mouth daily.   cholecalciferol 1000 units tablet Commonly known as: VITAMIN D Take 1,000 Units by mouth 2 (two) times a week. Takes on Mon and Fri   Cinnamon 500 MG capsule Take 1,000 mg by mouth 2 (two) times daily.   DAILY MULTIVITAMIN PO Take 1 tablet by mouth daily.   ferrous sulfate 325 (65 FE) MG EC tablet Take 325 mg by mouth 3 (three) times daily with meals.   finasteride 5 MG  tablet Commonly known as: PROSCAR TAKE 1 TABLET BY MOUTH ONCE DAILY   fish oil-omega-3 fatty acids 1000 MG capsule Take 1,000 mg by mouth daily.   furosemide 40 MG tablet Commonly known as: LASIX TAKE 1 TABLET BY MOUTH ONCE DAILY AS NEEDED FOR  LEG  SWELLING   hydrALAZINE 25 MG tablet Commonly known as: APRESOLINE TAKE 3 TABLETS BY MOUTH EVERY 8 HOURS   lisinopril 20 MG tablet Commonly known as: ZESTRIL Take 1 tablet (20 mg total) by mouth daily.   lisinopril 40 MG tablet Commonly known as: ZESTRIL Take 1 tablet by mouth once daily   OneTouch Ultra test strip Generic drug: glucose blood USE 1 STRIP TO CHECK GLUCOSE ONCE DAILY   pentoxifylline 400 MG CR tablet Commonly known as: TRENTAL Take 1 tablet (  400 mg total) by mouth every 12 (twelve) hours.   rosuvastatin 40 MG tablet Commonly known as: CRESTOR TAKE 1 TABLET BY MOUTH ONCE DAILY (CALL  CLINIC  TO  SCHEDULE  AN  APPOINTMENT  FOR  FUTURE  REFILLS)   saccharomyces boulardii 250 MG capsule Commonly known as: Florastor Take 1 capsule (250 mg total) by mouth daily.   tamsulosin 0.4 MG Caps capsule Commonly known as: FLOMAX Take 1 capsule by mouth once daily       Allergies:  Allergies  Allergen Reactions  . Isosorbide Mononitrate Other (See Comments)    Unknown, doesn't remember   . Ramipril Cough and Other (See Comments)  . Quinidine Gluconate Rash  . Quinidine Gluconate Other (See Comments) and Rash    Family History: Family History  Problem Relation Age of Onset  . Stroke Father   . Aneurysm Father   . Hypertension Father   . Prostate cancer Brother   . Hypertension Mother   . Lung cancer Brother        smoker  . Thrombosis Brother   . Other Brother        RF valve disorder (smoker)  . Lung cancer Brother        smoker  . Breast cancer Sister   . Liver cancer Brother   . Other Sister        cerebral hemorrhage    Social History:  reports that he has never smoked. He quit smokeless  tobacco use about 28 years ago. He reports that he does not drink alcohol and does not use drugs.   Physical Exam: BP (!) 143/66   Pulse (!) 49   Ht 5\' 5"  (1.651 m)   Wt 165 lb (74.8 kg)   BMI 27.46 kg/m   Constitutional:  Alert and oriented, No acute distress.  Elderly, in wheelchair, holding cane. HEENT:  AT, moist mucus membranes.  Trachea midline, no masses. Cardiovascular: No clubbing, cyanosis, or edema. Respiratory: Normal respiratory effort, no increased work of breathing. Skin: No rashes, bruises or suspicious lesions. Neurologic: Grossly intact, no focal deficits, moving all 4 extremities. Psychiatric: Normal mood and affect.   Assessment & Plan:    1. BPH with urinary obstruction Chronic severe outlet obstruction on maximal medical therapy  Given his age and comorbidities, his relatively poor surgical candidate for outlet procedure.  We will continue to monitor, ensure that he is emptying and only intervene if absolutely necessary.  2. Recurrent UTI Personal history of "recurrent" UTI however he may be chronically colonized with Pseudomonas  In light of his history of C. difficile, would strongly recommend against treating unless he is having severe dysuria or gross hematuria or any other systemic signs of infection.  Will not check a urine today.  3. History of gross hematuria No further episodes of gross hematuria  Most recent cystoscopy was approximately a year ago with Dr. Jeffie Pollock which showed some chronic inflammation around his left-sided bladder diverticulum which may in fact be the source of #2  We had a risk-benefit discussion about whether or not to repeat the cystoscopy today.  He would like to abstain from this at this time but will consider if he has another episode.    We will have him follow-up in 6 months with one of our PAs for urine recheck, IPSS/PVR.  He return sooner if he has any issues.    Hollice Espy, MD  [facility] [address] [place], [place]  [address] [phone]  I spent 30 total minutes on the day of the encounter including pre-visit review of the medical record, face-to-face time with the patient, and post visit ordering of labs/imaging/tests.  Much of this time was spent reviewing records received from Dr. Jeffie Pollock along with counseling the patient.

## 2020-09-13 ENCOUNTER — Other Ambulatory Visit: Payer: Self-pay | Admitting: Family Medicine

## 2020-09-13 ENCOUNTER — Encounter: Payer: Self-pay | Admitting: Family Medicine

## 2020-09-17 ENCOUNTER — Telehealth: Payer: Self-pay | Admitting: *Deleted

## 2020-09-17 ENCOUNTER — Telehealth: Payer: Self-pay | Admitting: Pharmacist

## 2020-09-17 MED ORDER — LISINOPRIL 40 MG PO TABS
40.0000 mg | ORAL_TABLET | Freq: Every day | ORAL | 0 refills | Status: DC
Start: 2020-09-17 — End: 2020-09-17

## 2020-09-17 NOTE — Telephone Encounter (Signed)
-----   Message from Rosetta Posner sent at 09/17/2020 11:56 AM EST ----- Regarding: Medication I recently spoke with the patient in regards of his medication. His medication list states he's suppose to be taking lisinopril, is it supposed to be 20 or 40 mg tablet. Called the patient to verify and the patient stated he doesn't have any of that medication at home with him, is he supposed to be taking this medication and if so which mg ?

## 2020-09-17 NOTE — Telephone Encounter (Signed)
Patient should not be taking Lisinopril.   He should be on Amlodipine 10mg , Atenolol 12.5mg , Hydralazine 75mg  PO TID, Furosemide 40mg .  If patient has concerns about BP medications, he should contact cardiology.

## 2020-09-17 NOTE — Progress Notes (Addendum)
Chronic Care Management Pharmacy Assistant   Name: Eric [name].  MRN: [id number] DOB: 06-Jan-1928  Reason for Encounter: Disease State for HTN.  Patient Questions:  1.  Have you seen any other providers since your last visit? Yes.   2.  Any changes in your medicines or health? No.    PCP : [name], MD   Their chronic conditions include: HTN, Type II DM w/ CKD, Hypertriglyceridemia, Atrial flutter  Office visits: None since 08/08/20  Consults: 09/11/20 Urology. Bryna Colander, MD. No medications changes.  08/14/20 Urology. Bryna Colander, MD. No medication changes.   Allergies:   Allergies  Allergen Reactions   Isosorbide Mononitrate Other (See Comments)    Unknown, doesn't remember    Ramipril Cough and Other (See Comments)   Quinidine Gluconate Rash   Quinidine Gluconate Other (See Comments) and Rash    Medications: Outpatient Encounter Medications as of 09/17/2020  Medication Sig   acetaminophen (TYLENOL) 325 MG tablet Take 2 tablets (650 mg total) by mouth every 6 (six) hours as needed for mild pain (or temp >/= 101 F).   amLODipine (NORVASC) 10 MG tablet Take 1 tablet (10 mg total) by mouth daily.   atenolol (TENORMIN) 25 MG tablet Take 0.5 tablets (12.5 mg total) by mouth daily.   B Complex Vitamins (B COMPLEX-B12) TABS Take 1 tablet by mouth daily.    cholecalciferol (VITAMIN D) 1000 UNITS tablet Take 1,000 Units by mouth 2 (two) times a week. Takes on Mon and Fri   Cinnamon 500 MG capsule Take 1,000 mg by mouth 2 (two) times daily.   ELIQUIS 5 MG TABS tablet Take 1 tablet by mouth twice daily   ferrous sulfate 325 (65 FE) MG EC tablet Take 325 mg by mouth 3 (three) times daily with meals.   finasteride (PROSCAR) 5 MG tablet TAKE 1 TABLET BY MOUTH ONCE DAILY   fish oil-omega-3 fatty acids 1000 MG capsule Take 1,000 mg by mouth daily.   furosemide (LASIX) 40 MG tablet TAKE 1 TABLET BY MOUTH ONCE DAILY AS NEEDED FOR  LEG  SWELLING   hydrALAZINE  (APRESOLINE) 25 MG tablet TAKE 3 TABLETS BY MOUTH EVERY 8 HOURS   lisinopril (PRINIVIL,ZESTRIL) 20 MG tablet Take 1 tablet (20 mg total) by mouth daily.   lisinopril (ZESTRIL) 40 MG tablet Take 1 tablet by mouth once daily   Multiple Vitamin (DAILY MULTIVITAMIN PO) Take 1 tablet by mouth daily.   ONETOUCH ULTRA test strip USE 1 STRIP TO CHECK GLUCOSE ONCE DAILY   pentoxifylline (TRENTAL) 400 MG CR tablet Take 1 tablet (400 mg total) by mouth every 12 (twelve) hours.   rosuvastatin (CRESTOR) 40 MG tablet TAKE 1 TABLET BY MOUTH ONCE DAILY (CALL  CLINIC  TO  SCHEDULE  AN  APPOINTMENT  FOR  FUTURE  REFILLS)   saccharomyces boulardii (FLORASTOR) 250 MG capsule Take 1 capsule (250 mg total) by mouth daily.   tamsulosin (FLOMAX) 0.4 MG CAPS capsule Take 1 capsule by mouth once daily   No facility-administered encounter medications on file as of 09/17/2020.    Current Diagnosis: Patient Active Problem List   Diagnosis Date Noted   Hematuria 07/24/2020   Hypertensive urgency 03/08/2019   Hx of BKA, left ([place]) 01/13/2018   Nausea and vomiting in adult 12/26/2017   Pressure injury of skin 12/23/2017   C. difficile colitis 12/22/2017   UTI (urinary tract infection) 12/22/2017   Hyponatremia 12/22/2017   Normocytic anemia 12/22/2017   ASO (  arteriosclerosis obliterans) 11/23/2017   Chronic anticoagulation 09/26/2015   History of anticoagulant therapy 09/26/2015   PVD (peripheral vascular disease) ([place]) 06/02/2012   Type 2 diabetes mellitus ([place]) 05/17/2012   Benign prostatic hyperplasia 11/16/2011   AKI (acute kidney injury) ([place]) 08/26/2011   Chronic kidney disease (CKD), stage III (moderate) ([place]) 08/26/2011   Chronic coronary artery disease 03/25/2011   Obstructive sleep apnea 09/15/2010   Other specified extrapyramidal and movement disorders 09/15/2010   PERSONAL HISTORY OF COLONIC POLYPS 10/25/2009   Vitamin B-complex deficiency 03/04/2009   Atrial flutter ([place]) 12/25/2008    Hypertriglyceridemia 11/24/2006   Essential hypertension 11/24/2006    Goals Addressed   None    Reviewed chart prior to disease state call. Spoke with patient regarding BP  Recent Office Vitals: BP Readings from Last 3 Encounters:  09/11/20 (!) 143/66  08/14/20 138/61  07/24/20 130/70   Pulse Readings from Last 3 Encounters:  09/11/20 (!) 49  08/14/20 (!) 59  07/24/20 60    Wt Readings from Last 3 Encounters:  09/11/20 165 lb (74.8 kg)  08/14/20 172 lb (78 kg)  07/24/20 176 lb 9.6 oz (80.1 kg)     Kidney Function Lab Results  Component Value Date/Time   CREATININE 1.12 07/11/2020 03:09 PM   CREATININE 1.26 (H) 02/22/2020 10:20 AM   CREATININE 1.76 (H) 10/12/2019 02:44 PM   GFR 48.95 (L) 10/10/2013 08:59 AM   GFRNONAA >60 07/11/2020 03:09 PM   GFRNONAA 50 (L) 02/22/2020 10:20 AM   GFRAA 58 (L) 02/22/2020 10:20 AM    BMP Latest Ref Rng & Units 07/11/2020 02/22/2020 10/12/2019  Glucose 70 - 99 mg/dL 86 95 74  BUN 8 - 23 mg/dL 21 18 33(H)  Creatinine 0.61 - 1.24 mg/dL 1.12 1.26(H) 1.76(H)  BUN/Creat Ratio 6 - 22 (calc) - 14 19  Sodium 135 - 145 mmol/L 134(L) 139 138  Potassium 3.5 - 5.1 mmol/L 4.6 4.0 5.6(H)  Chloride 98 - 111 mmol/L 106 106 104  CO2 22 - 32 mmol/L 23 25 25   Calcium 8.9 - 10.3 mg/dL 8.4(L) 8.9 9.6    Current antihypertensive regimen:  Amlodipine 10 mg Hydralazine 25 mg Atenolol 25 mg  How often are you checking your Blood Pressure? Patient stated he checks his blood pressure about 1-3 times daily   Current home BP readings: 139/61 09/16/20 PM  165/68 09/16/20 AM  09/15/20 135/61 PM  09/13/20 159/69 PM  What recent interventions/DTPs have been made by any provider to improve Blood Pressure control since last CPP Visit: None.  Any recent hospitalizations or ED visits since last visit with CPP? Patient stated no.  What diet changes have been made to improve Blood Pressure Control? Patient stated he eats whatever he feels like eating he's not on any  particular diet. He stated he only eats two times a day and snacks in between times. Patient stated he drinks mostly water but he does drink orange juice and apple juice.   What exercise is being done to improve your Blood Pressure Control? Patient stated he doesn't do much work or any exercise   Adherence Review: Is the patient currently on ACE/ARB medication? No.  Does the patient have >5 day gap between last estimated fill dates? No  Patient stated he does not have any concerns about his medication at this time.   Follow-Up:  Pharmacist Review   Charlann Lange, RMA Clinical Pharmacist Assistant [phone]  8 minutes spent in review, coordination, and documentation.  Reviewed by: Darrick Meigs  Rosana Hoes, PharmD Clinical Pharmacist [place] [phone]

## 2020-09-26 DIAGNOSIS — D485 Neoplasm of uncertain behavior of skin: Secondary | ICD-10-CM | POA: Diagnosis not present

## 2020-09-26 DIAGNOSIS — Z85828 Personal history of other malignant neoplasm of skin: Secondary | ICD-10-CM | POA: Diagnosis not present

## 2020-09-26 DIAGNOSIS — C44629 Squamous cell carcinoma of skin of left upper limb, including shoulder: Secondary | ICD-10-CM | POA: Diagnosis not present

## 2020-10-03 ENCOUNTER — Other Ambulatory Visit: Payer: Self-pay | Admitting: Family Medicine

## 2020-10-09 NOTE — Progress Notes (Signed)
Chronic Care Management Pharmacy Note  10/11/2020 Name:  Eric [name]. MRN:  [id number] DOB:  1928/02/16  Subjective: Eric Bua. is an 87 y.o. year old male who is a primary patient of Pickard, Cammie Mcgee, MD.  The CCM team was consulted for assistance with disease management and care coordination needs.    Engaged with patient face to face for follow up visit in response to provider referral for pharmacy case management and/or care coordination services.   Consent to Services:  The patient was given the following information about Chronic Care Management services today, agreed to services, and gave verbal consent: 1. CCM service includes personalized support from designated clinical staff supervised by the primary care provider, including individualized plan of care and coordination with other care providers 2. 24/7 contact phone numbers for assistance for urgent and routine care needs. 3. Service will only be billed when office clinical staff spend 20 minutes or more in a month to coordinate care. 4. Only one practitioner may furnish and bill the service in a calendar month. 5.The patient may stop CCM services at any time (effective at the end of the month) by phone call to the office staff. 6. The patient will be responsible for cost sharing (co-pay) of up to 20% of the service fee (after annual deductible is met). Patient agreed to services and consent obtained.  Patient Care Team: [name], MD as PCP - General (Family Medicine) Edythe Clarity, [facility] as Pharmacist (Pharmacist)  Recent office visits: 02/22/2020 Dennard Schaumann) - Reported to ED over the weekend due to BP in the 200s.  Edema in R leg.  Amlodipine was increased to 38m daily and blood pressure followed.  Likely have to start Lasix 476mdaily to control swelling.  Plan to check baseline renal function 1 week post lasix initiation.  Recent consult visits: 09/11/20 Urology. BrBryna ColanderMD. No medications  changes.  08/14/20 Urology. BrBryna ColanderMD. No medication changes.   Hospital visits: None since last CCM visit  Objective:  Lab Results  Component Value Date   CREATININE 1.12 07/11/2020   BUN 21 07/11/2020   GFR 48.95 (L) 10/10/2013   GFRNONAA >60 07/11/2020   GFRAA 58 (L) 02/22/2020   NA 134 (L) 07/11/2020   K 4.6 07/11/2020   CALCIUM 8.4 (L) 07/11/2020   CO2 23 07/11/2020    Lab Results  Component Value Date/Time   HGBA1C 5.3 05/03/2019 08:40 AM   HGBA1C 5.5 10/20/2018 08:55 AM   GFR 48.95 (L) 10/10/2013 08:59 AM   GFR 47.20 (L) 09/30/2012 08:38 AM   MICROALBUR 2.4 03/01/2017 08:42 AM   MICROALBUR 4.8 08/13/2016 08:00 AM    Last diabetic Eye exam:  Lab Results  Component Value Date/Time   HMDIABEYEEXA No Retinopathy 03/29/2018 12:00 AM    Last diabetic Foot exam:  Lab Results  Component Value Date/Time   HMDIABFOOTEX yes 01/29/2010 12:00 AM     Lab Results  Component Value Date   CHOL 98 05/03/2019   HDL 33 (L) 05/03/2019   LDLCALC 44 05/03/2019   LDLDIRECT 99.1 02/19/2011   TRIG 120 05/03/2019   CHOLHDL 3.0 05/03/2019    Hepatic Function Latest Ref Rng & Units 07/11/2020 05/03/2019 10/20/2018  Total Protein 6.5 - 8.1 g/dL 6.5 6.5 6.5  Albumin 3.5 - 5.0 g/dL 3.3(L) - -  AST 15 - 41 U/L 50(H) 27 29  ALT 0 - 44 U/L 35 19 16  Alk Phosphatase 38 - 126 U/L  40 - -  Total Bilirubin 0.3 - 1.2 mg/dL 0.7 0.5 0.6  Bilirubin, Direct 0.0 - 0.3 mg/dL - - -    Lab Results  Component Value Date/Time   TSH 3.428 04/30/2015 08:54 AM   TSH 1.09 02/19/2011 08:23 AM    CBC Latest Ref Rng & Units 07/24/2020 07/11/2020 05/03/2019  WBC 3.8 - 10.8 Thousand/uL 10.5 7.8 10.9(H)  Hemoglobin 13.2 - 17.1 g/dL 9.8(L) 10.0(L) 11.5(L)  Hematocrit 38.5 - 50.0 % 29.7(L) 31.4(L) 34.6(L)  Platelets 140 - 400 Thousand/uL 162 129(L) 201    Lab Results  Component Value Date/Time   VD25OH 26 (L) 09/20/2009 08:55 PM    Clinical ASCVD: No  The ASCVD Risk score Mikey Bussing DC [name],  et al., 2013) failed to calculate for the following reasons:   The 2013 ASCVD risk score is only valid for ages 43 to 32    Depression screen PHQ 2/9 07/24/2020 10/24/2018 10/20/2018  Decreased Interest 0 0 0  Down, Depressed, Hopeless 0 0 0  PHQ - 2 Score 0 0 0  Altered sleeping - 0 -  Tired, decreased energy - 0 -  Change in appetite - 0 -  Feeling bad or failure about yourself  - 0 -  Trouble concentrating - 0 -  Moving slowly or fidgety/restless - 0 -  Suicidal thoughts - 0 -  PHQ-9 Score - 0 -  Difficult doing work/chores - Not difficult at all -  Some recent data might be hidden     Social History   Tobacco Use  Smoking Status Never Smoker  Smokeless Tobacco Former User   BP Readings from Last 3 Encounters:  09/11/20 (!) 143/66  08/14/20 138/61  07/24/20 130/70   Pulse Readings from Last 3 Encounters:  09/11/20 (!) 49  08/14/20 (!) 59  07/24/20 60   Wt Readings from Last 3 Encounters:  09/11/20 165 lb (74.8 kg)  08/14/20 172 lb (78 kg)  07/24/20 176 lb 9.6 oz (80.1 kg)    Assessment/Interventions: Review of patient past medical history, allergies, medications, health status, including review of consultants reports, laboratory and other test data, was performed as part of comprehensive evaluation and provision of chronic care management services.   SDOH:  (Social Determinants of Health) assessments and interventions performed: No   CCM Care Plan  Allergies  Allergen Reactions  . Isosorbide Mononitrate Other (See Comments)    Unknown, doesn't remember   . Ramipril Cough and Other (See Comments)  . Quinidine Gluconate Rash  . Quinidine Gluconate Other (See Comments) and Rash    Medications Reviewed Today    Reviewed by Edythe Clarity, [facility] (Pharmacist) on 10/11/20 at 1041  Med List Status: <None>  Medication Order Taking? Sig Documenting Provider Last Dose Status Informant  acetaminophen (TYLENOL) 325 MG tablet [id number] Yes Take 2 tablets (650 mg  total) by mouth every 6 (six) hours as needed for mild pain (or temp >/= 101 F). Loletha Grayer, MD Taking Active Self  amLODipine (NORVASC) 10 MG tablet [id number] Yes Take 1 tablet (10 mg total) by mouth daily. [name], MD Taking Active Self  atenolol (TENORMIN) 25 MG tablet [id number] Yes Take 0.5 tablets (12.5 mg total) by mouth daily. Fay Records, MD Taking Active Self  B Complex Vitamins (B COMPLEX-B12) TABS [id number] Yes Take 1 tablet by mouth daily.  [provider] Taking Active Self  cholecalciferol (VITAMIN D) 1000 UNITS tablet [id number] Yes Take 1,000 Units by mouth 2 (two) times a week.  Takes on Mon and Fri [provider] Taking Active Self  Cinnamon 500 MG capsule [id number] Yes Take 1,000 mg by mouth 2 (two) times daily. [provider] Taking Active Self  ELIQUIS 5 MG TABS tablet [id number] Yes Take 1 tablet by mouth twice daily [name], MD Taking Active   ferrous sulfate 325 (65 FE) MG EC tablet [id number] Yes Take 325 mg by mouth 3 (three) times daily with meals. [provider] Taking Active Self  finasteride (PROSCAR) 5 MG tablet [id number] Yes TAKE 1 TABLET BY MOUTH ONCE DAILY [name], MD Taking Active Self  fish oil-omega-3 fatty acids 1000 MG capsule [id number] Yes Take 1,000 mg by mouth daily. [provider] Taking Active Self  furosemide (LASIX) 40 MG tablet [id number] Yes TAKE 1 TABLET BY MOUTH ONCE DAILY AS NEEDED FOR  LEG  SWELLING [name], MD Taking Active   hydrALAZINE (APRESOLINE) 25 MG tablet [id number] Yes TAKE 3 TABLETS BY MOUTH EVERY 8 HOURS [name], MD Taking Active   Multiple Vitamin (DAILY MULTIVITAMIN PO) [id number] Yes Take 1 tablet by mouth daily. [provider] Taking Active Self  Donald Siva test strip [id number] Yes USE 1 STRIP TO CHECK GLUCOSE ONCE DAILY [name], MD Taking Active Self  pentoxifylline (TRENTAL) 400 MG CR tablet [id number] Yes Take 1  tablet (400 mg total) by mouth every 12 (twelve) hours. [name], MD Taking Active   rosuvastatin (CRESTOR) 40 MG tablet [id number] Yes TAKE 1 TABLET BY MOUTH ONCE DAILY (CALL  CLINIC  TO  SCHEDULE  AN  APPOINTMENT  FOR  FUTURE  REFILLS) [name], MD Taking Active   saccharomyces boulardii (FLORASTOR) 250 MG capsule [id number] Yes Take 1 capsule (250 mg total) by mouth daily. [name], MD Taking Active Self  tamsulosin [facility]) 0.4 MG CAPS capsule [id number] Yes Take 1 capsule by mouth once daily [name], MD Taking Active           Patient Active Problem List   Diagnosis Date Noted  . Hematuria 07/24/2020  . Hypertensive urgency 03/08/2019  . Hx of BKA, left ([place]) 01/13/2018  . Nausea and vomiting in adult 12/26/2017  . Pressure injury of skin 12/23/2017  . C. difficile colitis 12/22/2017  . UTI (urinary tract infection) 12/22/2017  . Hyponatremia 12/22/2017  . Normocytic anemia 12/22/2017  . ASO (arteriosclerosis obliterans) 11/23/2017  . Chronic anticoagulation 09/26/2015  . History of anticoagulant therapy 09/26/2015  . PVD (peripheral vascular disease) ([place]) 06/02/2012  . Type 2 diabetes mellitus ([place]) 05/17/2012  . Benign prostatic hyperplasia 11/16/2011  . AKI (acute kidney injury) ([place]) 08/26/2011  . Chronic kidney disease (CKD), stage III (moderate) ([place]) 08/26/2011  . Chronic coronary artery disease 03/25/2011  . Obstructive sleep apnea 09/15/2010  . Other specified extrapyramidal and movement disorders 09/15/2010  . PERSONAL HISTORY OF COLONIC POLYPS 10/25/2009  . Vitamin B-complex deficiency 03/04/2009  . Atrial flutter ([place]) 12/25/2008  . Hypertriglyceridemia 11/24/2006  . Essential hypertension 11/24/2006    Immunization History  Administered Date(s) Administered  . Fluad Quad(high Dose 65+) 05/03/2019  . Influenza Split 07/14/2011, 06/02/2012  . Influenza Whole 05/24/2005, 05/20/2007, 05/21/2008, 07/19/2009, 05/28/2010  .  Influenza, High Dose Seasonal PF 05/03/2017  . Influenza,inj,Quad PF,6+ Mos 04/11/2013, 04/20/2014, 05/06/2015, 06/02/2016, 05/13/2018  . Influenza-Unspecified 05/03/2017  . PFIZER(Purple Top)SARS-COV-2 Vaccination 10/05/2019  . Pneumococcal Conjugate-13 05/06/2015  . Pneumococcal Polysaccharide-23 10/17/2013  . Td 03/13/2004  . Tdap 04/14/2019  Conditions to be addressed/monitored:  HTN, Type II DM w/ CKD, Hypertriglyceridemia, Atrial flutter  Care Plan : General Pharmacy (Adult)  Updates made by Edythe Clarity, RPH since 10/11/2020 12:00 AM    Problem: HTN, Type II DM w/ CKD, Hypertriglyceridemia, Atrial flutter   Priority: High  Onset Date: 10/11/2020    Long-Range Goal: Patient-Specific Goal   Start Date: 10/11/2020  Expected End Date: 04/13/2021  This Visit's Progress: On track  Priority: High  Note:   Current Barriers:  . Unable to independently afford treatment regimen  Pharmacist Clinical Goal(s):  [name] Over the next 180 days, patient will verbalize ability to afford treatment regimen . maintain control of blood pressure as evidenced by home monitoring  . adhere to prescribed medication regimen as evidenced by fill dates . contact provider office for questions/concerns as evidenced notation of same in electronic health record through collaboration with PharmD and provider.   Interventions: . 1:1 collaboration with [name], MD regarding development and update of comprehensive plan of care as evidenced by provider attestation and co-signature . Inter-disciplinary care team collaboration (see longitudinal plan of care) . Comprehensive medication review performed; medication list updated in electronic medical record  Hypertension (BP goal <130/80) -Controlled -Current treatment:  Amlodipine 76m  Atenolol 279mone-half tablet po daily  Hydralazine 2560mhree tablets po tid -Medications previously tried: ramipril (cough), isosorbide  -Current home readings:  N/A -Denies hypotensive/hypertensive symptoms -Educated on BP goals and benefits of medications for prevention of heart attack, stroke and kidney damage; Daily salt intake goal < 2300 mg; Importance of home blood pressure monitoring; -Counseled to monitor BP at home daily, document, and provide log at future appointments -Recommended to continue current medication  Hypertriglyceridemia: (LDL goal < 70, TG < 150) -Controlled -Current treatment:  Rosuvastatin 83m33medications previously tried: none noted   -Educated on Cholesterol goals;  Benefits of statin for ASCVD risk reduction;  -patient still needs updated lipid panel -Recommended to continue current medication  Diabetes (A1c goal <7%) -Controlled -Current medications: . None -Medications previously tried: none noted   -Denies hypoglycemic/hyperglycemic symptoms -Current meal patterns: patient is watching portion sizes -Current exercise: minimal -Educated onA1c and blood sugar goals; -Counseled to check feet daily and get yearly eye exams -Recommended continue current management strategies  Atrial Flutter(Goal: Control HR) -Controlled -Current treatment   Eliquis 5mg 12mce daily -Medications previously tried: none noted -Assessed patient finances. He presents to the office today with this as main concern.  Brings in paperwork for eliquis PAP program through [employer].  Paperwork completed and patient signatures obtained.  Will have Dr. PickaDennard Schaumann Rx portion.  They have still not met 3% OOP spend requirement, but provided the number they need to reach and they will contact me once they have done this. -Denies abnormal bleeding or bruising. -Currently taking as prescribed. -Recommend continue current medications.  Patient Goals/Self-Care Activities . Over the next 180 days, patient will:  - take medications as prescribed check blood pressure daily, document, and provide at future appointments collaborate with provider  on medication access solutions  Follow Up Plan: The care management team will reach out to the patient again over the next 180 days.         Medication Assistance: Application for Eliquis  medication assistance program. in process.  Anticipated assistance start date unknown.  Patient must meet 3% OOP to qualify..  See plan of care for additional detail.  Patient's preferred pharmacy is:  [facility] [address]  [place] [address] Phone: [phone] Fax: [phone]  [facility] - [place], [place] [address] Phone: [phone] Fax: [phone]  Uses pill box? Yes Pt endorses 100% compliance  We discussed: Benefits of medication synchronization, packaging and delivery as well as enhanced pharmacist oversight with [employer]. Patient decided to: Continue current medication management strategy  Care Plan and Follow Up Patient Decision:  Patient agrees to Care Plan and Follow-up.  Plan: The care management team will reach out to the patient again over the next 180 days.  Beverly Milch, PharmD Clinical Pharmacist [place] [phone]

## 2020-10-11 ENCOUNTER — Ambulatory Visit (INDEPENDENT_AMBULATORY_CARE_PROVIDER_SITE_OTHER): Payer: PPO | Admitting: Pharmacist

## 2020-10-11 DIAGNOSIS — E781 Pure hyperglyceridemia: Secondary | ICD-10-CM

## 2020-10-11 DIAGNOSIS — I4892 Unspecified atrial flutter: Secondary | ICD-10-CM

## 2020-10-11 DIAGNOSIS — E1152 Type 2 diabetes mellitus with diabetic peripheral angiopathy with gangrene: Secondary | ICD-10-CM | POA: Diagnosis not present

## 2020-10-11 DIAGNOSIS — I10 Essential (primary) hypertension: Secondary | ICD-10-CM

## 2020-10-11 NOTE — Patient Instructions (Addendum)
Visit Information  Goals Addressed            This Visit's Progress   . Manage My Medicine       Timeframe:  Long-Range Goal Priority:  High Start Date:  10/11/20                           Expected End Date: 04/13/21                      Follow Up Date 12/07/20   - call for medicine refill 2 or 3 days before it runs out - keep a list of all the medicines I take; vitamins and herbals too - use a pillbox to sort medicine    Why is this important?   . These steps will help you keep on track with your medicines.   Notes: Contact PharmD when 3% spend is met for patient assistance programs.      Patient Care Plan: General Pharmacy (Adult)    Problem Identified: HTN, Type II DM w/ CKD, Hypertriglyceridemia, Atrial flutter   Priority: High  Onset Date: 10/11/2020    Long-Range Goal: Patient-Specific Goal   Start Date: 10/11/2020  Expected End Date: 04/13/2021  This Visit's Progress: On track  Priority: High  Note:   Current Barriers:  . Unable to independently afford treatment regimen  Pharmacist Clinical Goal(s):  [name] Over the next 180 days, patient will verbalize ability to afford treatment regimen . maintain control of blood pressure as evidenced by home monitoring  . adhere to prescribed medication regimen as evidenced by fill dates . contact provider office for questions/concerns as evidenced notation of same in electronic health record through collaboration with PharmD and provider.   Interventions: . 1:1 collaboration with [name], MD regarding development and update of comprehensive plan of care as evidenced by provider attestation and co-signature . Inter-disciplinary care team collaboration (see longitudinal plan of care) . Comprehensive medication review performed; medication list updated in electronic medical record  Hypertension (BP goal <130/80) -Controlled -Current treatment:  Amlodipine 45m  Atenolol 242mone-half tablet po daily  Hydralazine  257mhree tablets po tid -Medications previously tried: ramipril (cough), isosorbide  -Current home readings: N/A -Denies hypotensive/hypertensive symptoms -Educated on BP goals and benefits of medications for prevention of heart attack, stroke and kidney damage; Daily salt intake goal < 2300 mg; Importance of home blood pressure monitoring; -Counseled to monitor BP at home daily, document, and provide log at future appointments -Recommended to continue current medication  Hypertriglyceridemia: (LDL goal < 70, TG < 150) -Controlled -Current treatment:  Rosuvastatin 22m69medications previously tried: none noted   -Educated on Cholesterol goals;  Benefits of statin for ASCVD risk reduction;  -patient still needs updated lipid panel -Recommended to continue current medication  Diabetes (A1c goal <7%) -Controlled -Current medications: . None -Medications previously tried: none noted   -Denies hypoglycemic/hyperglycemic symptoms -Current meal patterns: patient is watching portion sizes -Current exercise: minimal -Educated onA1c and blood sugar goals; -Counseled to check feet daily and get yearly eye exams -Recommended continue current management strategies  Atrial Flutter(Goal: Control HR) -Controlled -Current treatment   Eliquis 5mg 73mce daily -Medications previously tried: none noted -Assessed patient finances. He presents to the office today with this as main concern.  Brings in paperwork for eliquis PAP program through [employer].  Paperwork completed and patient signatures obtained.  Will have Dr. PickaDennard Schaumann Rx portion.  They  have still not met 3% OOP spend requirement, but provided the number they need to reach and they will contact me once they have done this. -Denies abnormal bleeding or bruising. -Currently taking as prescribed. -Recommend continue current medications.  Patient Goals/Self-Care Activities . Over the next 180 days, patient will:  - take medications as  prescribed check blood pressure daily, document, and provide at future appointments collaborate with provider on medication access solutions  Follow Up Plan: The care management team will reach out to the patient again over the next 180 days.         The patient verbalized understanding of instructions, educational materials, and care plan provided today and agreed to receive a mailed copy of patient instructions, educational materials, and care plan.  Telephone follow up appointment with pharmacy team member scheduled for: Next month  Edythe Clarity, [facility]  Preventing Hypertension Hypertension, also called high blood pressure, is when the force of blood pumping through the arteries is too strong. Arteries are blood vessels that carry blood from the heart throughout the body. Often, hypertension does not cause symptoms until blood pressure is very high. It is important to have your blood pressure checked regularly. Diet and lifestyle changes can help you prevent hypertension, and they may make you feel better overall and improve your quality of life. If you already have hypertension, you may control it with diet and lifestyle changes, as well as with medicine. How can this condition affect me? Over time, hypertension can damage the arteries and decrease blood flow to important parts of the body, including the brain, heart, and kidneys. By keeping your blood pressure in a healthy range, you can help prevent complications like heart attack, heart failure, stroke, kidney failure, and vascular dementia. What can increase my risk?  Being an older adult. Older people are more often affected.  Having family members who have had high blood pressure.  Being obese.  Being male. Males are more likely to have high blood pressure.  Drinking too much alcohol or caffeine.  Smoking or using illegal drugs.  Taking certain medicines, such as antidepressants, decongestants, birth control pills, and  NSAIDs, such as ibuprofen.  Having thyroid problems.  Having certain tumors. What actions can I take to prevent or manage this condition? Work with your health care provider to make a hypertension prevention plan that works for you. Follow your plan and keep all follow-up visits as told by your health care provider. Diet changes Maintain a healthy diet. This includes:  Eating less salt (sodium). Ask your health care provider how much sodium is safe for you to have. The general recommendation is to have less than 1 tsp (2,300 mg) of sodium a day. ? Do not add salt to your food. ? Choose low-sodium options when grocery shopping and eating out.  Limiting fats in your diet. You can do this by eating low-fat or fat-free dairy products and by eating less red meat.  Eating more fruits, vegetables, and whole grains. Make a goal to eat: ? 1-2 cups of fresh fruits and vegetables each day. ? 3-4 servings of whole grains each day.  Avoiding foods and beverages that have added sugars.  Eating fish that contain healthy fats (omega-3 fatty acids), such as mackerel or salmon. If you need help putting together a healthy eating plan, try the DASH diet. This diet is high in fruits, vegetables, and whole grains. It is low in sodium, red meat, and added sugars. DASH stands for Dietary  Approaches to Stop Hypertension.   Lifestyle changes Lose weight if you are overweight. Losing just 3?5% of your body weight can help prevent or control hypertension. For example, if your present weight is 200 lb (91 kg), a loss of 3-5% of your weight means losing 6-10 lb (2.7-4.5 kg). Ask your health care provider to help you with a diet and exercise plan to safely lose weight. Other recommendations usually include:  Get enough exercise. Do at least 150 minutes of moderate-intensity exercise each week. You could do this in short exercise sessions several times a day, or you could do longer exercise sessions a few times a week.  For example, you could take a brisk 10-minute walk or bike ride, 3 times a day, for 5 days a week.  Find ways to reduce stress, such as exercising, meditating, listening to music, or taking a yoga class. If you need help reducing stress, ask your health care provider.  Do not use any products that contain nicotine or tobacco, such as cigarettes, e-cigarettes, and chewing tobacco. If you need help quitting, ask your health care provider. Chemicals in tobacco and nicotine products raise your blood pressure each time you use them. If you need help quitting, ask your health care provider.  Learn how to check your blood pressure at home. Make sure that you know your personal target blood pressure, as told by your health care provider.  Try to sleep 7-9 hours per night.   Alcohol use  Do not drink alcohol if: ? Your health care provider tells you not to drink. ? You are pregnant, may be pregnant, or are planning to become pregnant.  If you drink alcohol: ? Limit how much you use to:  0-1 drink a day for women.  0-2 drinks a day for men. ? Be aware of how much alcohol is in your drink. In the [place] one drink equals one 12 oz bottle of beer (355 mL), one 5 oz glass of wine (148 mL), or one 1 oz glass of hard liquor (44 mL). Medicines In addition to diet and lifestyle changes, your health care provider may recommend medicines to help lower your blood pressure. In general:  You may need to try a few different medicines to find what works best for you.  You may need to take more than one medicine.  Take over-the-counter and prescription medicines only as told by your health care provider. Questions to ask your health care provider  What is my blood pressure goal?  How can I lower my risk for high blood pressure?  How should I monitor my blood pressure at home? Where to find support Your health care provider can help you prevent hypertension and help you keep your blood pressure at a  healthy level. Your local hospital or your community may also provide support services and prevention programs. The American Heart Association offers an online support network at supportnetwork.heart.org Where to find more information Learn more about hypertension from:  [place], Lung, and [place]: https://wilson-eaton.com/  Centers for Disease Control and Prevention: http://www.wolf.info/  American Academy of Family Physicians: familydoctor.org Learn more about the DASH diet from:  [place], Lung, and [place]: https://wilson-eaton.com/ Contact a health care provider if:  You think you are having a reaction to medicines you have taken.  You have recurrent headaches or feel dizzy.  You have swelling in your ankles.  You have trouble with your vision. Get help right away if:  You have sudden, severe  chest, back, or abdominal pain or discomfort.  You have shortness of breath.  You have a sudden, severe headache. These symptoms may represent a serious problem that is an emergency. Do not wait to see if the symptoms will go away. Get medical help right away. Call your local emergency services (911 in the [place] Do not drive yourself to the hospital.  Summary  Hypertension often does not cause any symptoms until blood pressure is very high. It is important to get your blood pressure checked regularly.  Diet and lifestyle changes are important steps in preventing hypertension.  By keeping your blood pressure in a healthy range, you may prevent complications like heart attack, heart failure, stroke, and kidney failure.  Work with your health care provider to make a hypertension prevention plan that works for you. This information is not intended to replace advice given to you by your health care provider. Make sure you discuss any questions you have with your health care provider. Document Revised: 06/27/2019 Document Reviewed: 06/27/2019 Elsevier Patient Education  2021 [employer].

## 2020-11-01 ENCOUNTER — Other Ambulatory Visit: Payer: Self-pay | Admitting: Family Medicine

## 2020-11-15 ENCOUNTER — Other Ambulatory Visit: Payer: Self-pay | Admitting: Family Medicine

## 2020-11-22 DIAGNOSIS — D6869 Other thrombophilia: Secondary | ICD-10-CM | POA: Diagnosis not present

## 2020-11-22 DIAGNOSIS — I70203 Unspecified atherosclerosis of native arteries of extremities, bilateral legs: Secondary | ICD-10-CM | POA: Diagnosis not present

## 2020-11-22 DIAGNOSIS — E1159 Type 2 diabetes mellitus with other circulatory complications: Secondary | ICD-10-CM | POA: Diagnosis not present

## 2020-11-22 DIAGNOSIS — I480 Paroxysmal atrial fibrillation: Secondary | ICD-10-CM | POA: Diagnosis not present

## 2020-11-22 DIAGNOSIS — N183 Chronic kidney disease, stage 3 unspecified: Secondary | ICD-10-CM | POA: Diagnosis not present

## 2020-11-22 DIAGNOSIS — Z7901 Long term (current) use of anticoagulants: Secondary | ICD-10-CM | POA: Diagnosis not present

## 2020-11-22 DIAGNOSIS — E1151 Type 2 diabetes mellitus with diabetic peripheral angiopathy without gangrene: Secondary | ICD-10-CM | POA: Diagnosis not present

## 2020-11-22 DIAGNOSIS — E1122 Type 2 diabetes mellitus with diabetic chronic kidney disease: Secondary | ICD-10-CM | POA: Diagnosis not present

## 2020-11-22 DIAGNOSIS — I2510 Atherosclerotic heart disease of native coronary artery without angina pectoris: Secondary | ICD-10-CM | POA: Diagnosis not present

## 2020-11-22 DIAGNOSIS — Z89512 Acquired absence of left leg below knee: Secondary | ICD-10-CM | POA: Diagnosis not present

## 2020-11-22 DIAGNOSIS — D692 Other nonthrombocytopenic purpura: Secondary | ICD-10-CM | POA: Diagnosis not present

## 2020-11-22 DIAGNOSIS — Z951 Presence of aortocoronary bypass graft: Secondary | ICD-10-CM | POA: Diagnosis not present

## 2020-11-23 ENCOUNTER — Other Ambulatory Visit: Payer: Self-pay | Admitting: Family Medicine

## 2020-11-23 DIAGNOSIS — M7989 Other specified soft tissue disorders: Secondary | ICD-10-CM

## 2020-11-25 NOTE — Progress Notes (Signed)
Chronic Care Management Pharmacy Note  11/29/2020 Name:  Eric [name]. MRN:  [id number] DOB:  11-11-1927  Subjective: Eric [name]. is an 87 y.o. year old male who is a primary patient of Pickard, Cammie Mcgee, MD.  The CCM team was consulted for assistance with disease management and care coordination needs.    Engaged with patient face to face for follow up visit in response to provider referral for pharmacy case management and/or care coordination services.   Consent to Services:  The patient was given the following information about Chronic Care Management services today, agreed to services, and gave verbal consent: 1. CCM service includes personalized support from designated clinical staff supervised by the primary care provider, including individualized plan of care and coordination with other care providers 2. 24/7 contact phone numbers for assistance for urgent and routine care needs. 3. Service will only be billed when office clinical staff spend 20 minutes or more in a month to coordinate care. 4. Only one practitioner may furnish and bill the service in a calendar month. 5.The patient may stop CCM services at any time (effective at the end of the month) by phone call to the office staff. 6. The patient will be responsible for cost sharing (co-pay) of up to 20% of the service fee (after annual deductible is met). Patient agreed to services and consent obtained.  Patient Care Team: [name], MD as PCP - General (Family Medicine) Edythe Clarity, [facility] as Pharmacist (Pharmacist)  Recent office visits: 02/22/2020 Dennard Schaumann) - Reported to ED over the weekend due to BP in the 200s.  Edema in R leg.  Amlodipine was increased to 29m daily and blood pressure followed.  Likely have to start Lasix 423mdaily to control swelling.  Plan to check baseline renal function 1 week post lasix initiation.  Recent consult visits: 09/11/20 Urology. BrBryna ColanderMD. No medications  changes.  08/14/20 Urology. BrBryna ColanderMD. No medication changes.   Hospital visits: None since last CCM visit  Objective:  Lab Results  Component Value Date   CREATININE 1.12 07/11/2020   BUN 21 07/11/2020   GFR 48.95 (L) 10/10/2013   GFRNONAA >60 07/11/2020   GFRAA 58 (L) 02/22/2020   NA 134 (L) 07/11/2020   K 4.6 07/11/2020   CALCIUM 8.4 (L) 07/11/2020   CO2 23 07/11/2020    Lab Results  Component Value Date/Time   HGBA1C 5.3 05/03/2019 08:40 AM   HGBA1C 5.5 10/20/2018 08:55 AM   GFR 48.95 (L) 10/10/2013 08:59 AM   GFR 47.20 (L) 09/30/2012 08:38 AM   MICROALBUR 2.4 03/01/2017 08:42 AM   MICROALBUR 4.8 08/13/2016 08:00 AM    Last diabetic Eye exam:  Lab Results  Component Value Date/Time   HMDIABEYEEXA No Retinopathy 03/29/2018 12:00 AM    Last diabetic Foot exam:  Lab Results  Component Value Date/Time   HMDIABFOOTEX yes 01/29/2010 12:00 AM     Lab Results  Component Value Date   CHOL 98 05/03/2019   HDL 33 (L) 05/03/2019   LDLCALC 44 05/03/2019   LDLDIRECT 99.1 02/19/2011   TRIG 120 05/03/2019   CHOLHDL 3.0 05/03/2019    Hepatic Function Latest Ref Rng & Units 07/11/2020 05/03/2019 10/20/2018  Total Protein 6.5 - 8.1 g/dL 6.5 6.5 6.5  Albumin 3.5 - 5.0 g/dL 3.3(L) - -  AST 15 - 41 U/L 50(H) 27 29  ALT 0 - 44 U/L 35 19 16  Alk Phosphatase 38 - 126 U/L  40 - -  Total Bilirubin 0.3 - 1.2 mg/dL 0.7 0.5 0.6  Bilirubin, Direct 0.0 - 0.3 mg/dL - - -    Lab Results  Component Value Date/Time   TSH 3.428 04/30/2015 08:54 AM   TSH 1.09 02/19/2011 08:23 AM    CBC Latest Ref Rng & Units 07/24/2020 07/11/2020 05/03/2019  WBC 3.8 - 10.8 Thousand/uL 10.5 7.8 10.9(H)  Hemoglobin 13.2 - 17.1 g/dL 9.8(L) 10.0(L) 11.5(L)  Hematocrit 38.5 - 50.0 % 29.7(L) 31.4(L) 34.6(L)  Platelets 140 - 400 Thousand/uL 162 129(L) 201    Lab Results  Component Value Date/Time   VD25OH 26 (L) 09/20/2009 08:55 PM    Clinical ASCVD: No  The ASCVD Risk score Mikey Bussing DC [name],  et al., 2013) failed to calculate for the following reasons:   The 2013 ASCVD risk score is only valid for ages 42 to 31    Depression screen PHQ 2/9 07/24/2020 10/24/2018 10/20/2018  Decreased Interest 0 0 0  Down, Depressed, Hopeless 0 0 0  PHQ - 2 Score 0 0 0  Altered sleeping - 0 -  Tired, decreased energy - 0 -  Change in appetite - 0 -  Feeling bad or failure about yourself  - 0 -  Trouble concentrating - 0 -  Moving slowly or fidgety/restless - 0 -  Suicidal thoughts - 0 -  PHQ-9 Score - 0 -  Difficult doing work/chores - Not difficult at all -  Some recent data might be hidden     Social History   Tobacco Use  Smoking Status Never Smoker  Smokeless Tobacco Former User   BP Readings from Last 3 Encounters:  09/11/20 (!) 143/66  08/14/20 138/61  07/24/20 130/70   Pulse Readings from Last 3 Encounters:  09/11/20 (!) 49  08/14/20 (!) 59  07/24/20 60   Wt Readings from Last 3 Encounters:  09/11/20 165 lb (74.8 kg)  08/14/20 172 lb (78 kg)  07/24/20 176 lb 9.6 oz (80.1 kg)    Assessment/Interventions: Review of patient past medical history, allergies, medications, health status, including review of consultants reports, laboratory and other test data, was performed as part of comprehensive evaluation and provision of chronic care management services.   SDOH:  (Social Determinants of Health) assessments and interventions performed: No   CCM Care Plan  Allergies  Allergen Reactions  . Isosorbide Mononitrate Other (See Comments)    Unknown, doesn't remember   . Ramipril Cough and Other (See Comments)  . Quinidine Gluconate Rash  . Quinidine Gluconate Other (See Comments) and Rash    Medications Reviewed Today    Reviewed by Edythe Clarity, [facility] (Pharmacist) on 11/28/20 at 1644  Med List Status: <None>  Medication Order Taking? Sig Documenting Provider Last Dose Status Informant  acetaminophen (TYLENOL) 325 MG tablet [id number] Yes Take 2 tablets (650 mg  total) by mouth every 6 (six) hours as needed for mild pain (or temp >/= 101 F). Loletha Grayer, MD Taking Active Self  amLODipine (NORVASC) 10 MG tablet [id number] Yes Take 1 tablet (10 mg total) by mouth daily. [name], MD Taking Active Self  atenolol (TENORMIN) 25 MG tablet [id number] Yes Take 0.5 tablets (12.5 mg total) by mouth daily. Fay Records, MD Taking Active Self  B Complex Vitamins (B COMPLEX-B12) TABS [id number] Yes Take 1 tablet by mouth daily.  [provider] Taking Active Self  cholecalciferol (VITAMIN D) 1000 UNITS tablet [id number] Yes Take 1,000 Units by mouth 2 (two) times a week.  Takes on Mon and Fri [provider] Taking Active Self  Cinnamon 500 MG capsule [id number] Yes Take 1,000 mg by mouth 2 (two) times daily. [provider] Taking Active Self  ELIQUIS 5 MG TABS tablet [id number] Yes Take 1 tablet by mouth twice daily [name], MD Taking Active   ferrous sulfate 325 (65 FE) MG EC tablet [id number] Yes Take 325 mg by mouth 3 (three) times daily with meals. [provider] Taking Active Self  finasteride (PROSCAR) 5 MG tablet [id number] Yes TAKE 1 TABLET BY MOUTH ONCE DAILY [name], MD Taking Active Self  fish oil-omega-3 fatty acids 1000 MG capsule [id number] Yes Take 1,000 mg by mouth daily. [provider] Taking Active Self  furosemide (LASIX) 40 MG tablet [id number] Yes TAKE 1 TABLET BY MOUTH ONCE DAILY AS NEEDED FOR  LEG  SWELLING [name], MD Taking Active   hydrALAZINE (APRESOLINE) 25 MG tablet [id number] Yes TAKE 3 TABLETS BY MOUTH EVERY 8 HOURS Pickard, Cammie Mcgee, MD Taking Active   lisinopril (ZESTRIL) 40 MG tablet [id number] Yes Take 1 tablet by mouth once daily [name], MD Taking Active   Multiple Vitamin (DAILY MULTIVITAMIN PO) [id number] Yes Take 1 tablet by mouth daily. [provider] Taking Active Self  Donald Siva test strip [id number] Yes USE 1 STRIP TO CHECK GLUCOSE  ONCE DAILY [name], MD Taking Active Self  pentoxifylline (TRENTAL) 400 MG CR tablet [id number] Yes Take 1 tablet (400 mg total) by mouth every 12 (twelve) hours. [name], MD Taking Active   rosuvastatin (CRESTOR) 40 MG tablet [id number] Yes TAKE 1 TABLET BY MOUTH ONCE DAILY (CALL  CLINIC  TO  SCHEDULE  AN  APPOINTMENT  FOR  FUTURE  REFILLS) [name], MD Taking Active   saccharomyces boulardii (FLORASTOR) 250 MG capsule [id number] Yes Take 1 capsule (250 mg total) by mouth daily. [name], MD Taking Active Self  tamsulosin [facility]) 0.4 MG CAPS capsule [id number] Yes Take 1 capsule by mouth once daily [name], MD Taking Active           Patient Active Problem List   Diagnosis Date Noted  . Hematuria 07/24/2020  . Hypertensive urgency 03/08/2019  . Hx of BKA, left ([place]) 01/13/2018  . Nausea and vomiting in adult 12/26/2017  . Pressure injury of skin 12/23/2017  . C. difficile colitis 12/22/2017  . UTI (urinary tract infection) 12/22/2017  . Hyponatremia 12/22/2017  . Normocytic anemia 12/22/2017  . ASO (arteriosclerosis obliterans) 11/23/2017  . Chronic anticoagulation 09/26/2015  . History of anticoagulant therapy 09/26/2015  . PVD (peripheral vascular disease) ([place]) 06/02/2012  . Type 2 diabetes mellitus ([place]) 05/17/2012  . Benign prostatic hyperplasia 11/16/2011  . AKI (acute kidney injury) ([place]) 08/26/2011  . Chronic kidney disease (CKD), stage III (moderate) ([place]) 08/26/2011  . Chronic coronary artery disease 03/25/2011  . Obstructive sleep apnea 09/15/2010  . Other specified extrapyramidal and movement disorders 09/15/2010  . PERSONAL HISTORY OF COLONIC POLYPS 10/25/2009  . Vitamin B-complex deficiency 03/04/2009  . Atrial flutter ([place]) 12/25/2008  . Hypertriglyceridemia 11/24/2006  . Essential hypertension 11/24/2006    Immunization History  Administered Date(s) Administered  . Fluad Quad(high Dose 65+) 05/03/2019  . Influenza  Split 07/14/2011, 06/02/2012  . Influenza Whole 05/24/2005, 05/20/2007, 05/21/2008, 07/19/2009, 05/28/2010  . Influenza, High Dose Seasonal PF 05/03/2017  . Influenza,inj,Quad PF,6+ Mos 04/11/2013, 04/20/2014, 05/06/2015, 06/02/2016, 05/13/2018  . Influenza-Unspecified 05/03/2017  . PFIZER(Purple Top)SARS-COV-2 Vaccination  10/05/2019  . Pneumococcal Conjugate-13 05/06/2015  . Pneumococcal Polysaccharide-23 10/17/2013  . Td 03/13/2004  . Tdap 04/14/2019    Conditions to be addressed/monitored:  HTN, Type II DM w/ CKD, Hypertriglyceridemia, Atrial flutter  Goals Addressed            This Visit's Progress   . Manage My Medicine       Timeframe:  Long-Range Goal Priority:  High Start Date:  10/11/20                           Expected End Date: 04/13/21                      Follow Up Date 12/07/20   - call for medicine refill 2 or 3 days before it runs out - keep a list of all the medicines I take; vitamins and herbals too - use a pillbox to sort medicine    Why is this important?   . These steps will help you keep on track with your medicines.   Notes: 11/29/20 - still has not met 3% OOP    . Track and Manage My Blood Pressure-Hypertension   On track    Timeframe:  Long-Range Goal Priority:  High Start Date:   11/29/20                          Expected End Date:    05/31/21                   Follow Up Date 03/09/21   - check blood pressure daily - choose a place to take my blood pressure (home, clinic or office, retail store) - write blood pressure results in a log or diary    Why is this important?    You won't feel high blood pressure, but it can still hurt your blood vessels.   High blood pressure can cause heart or kidney problems. It can also cause a stroke.   Making lifestyle changes like losing a little weight or eating less salt will help.   Checking your blood pressure at home and at different times of the day can help to control blood pressure.   If  the doctor prescribes medicine remember to take it the way the doctor ordered.   Call the office if you cannot afford the medicine or if there are questions about it.     Notes:        Care Plan : General Pharmacy (Adult)  Updates made by Edythe Clarity, RPH since 11/29/2020 12:00 AM    Problem: HTN, Type II DM w/ CKD, Hypertriglyceridemia, Atrial flutter   Priority: High  Onset Date: 10/11/2020    Long-Range Goal: Patient-Specific Goal   Start Date: 10/11/2020  Expected End Date: 04/13/2021  Recent Progress: On track  Priority: High  Note:   Current Barriers:  . Unable to independently afford treatment regimen  Pharmacist Clinical Goal(s):  [name] Over the next 180 days, patient will verbalize ability to afford treatment regimen . maintain control of blood pressure as evidenced by home monitoring  . adhere to prescribed medication regimen as evidenced by fill dates . contact provider office for questions/concerns as evidenced notation of same in electronic health record through collaboration with PharmD and provider.   Interventions: . 1:1 collaboration with [name], MD regarding development and update of comprehensive plan of care  as evidenced by provider attestation and co-signature . Inter-disciplinary care team collaboration (see longitudinal plan of care) . Comprehensive medication review performed; medication list updated in electronic medical record  Hypertension (BP goal <130/80) -Controlled -Current treatment:  Amlodipine 35m  Atenolol 277mone-half tablet po daily  Hydralazine 2551mhree tablets po tid  Lisinopril 51m61medications previously tried: ramipril (cough), isosorbide  -Current home readings: 163/70 55P, 160/73 54P, 163/78 61P -Denies hypotensive/hypertensive symptoms -Educated on BP goals and benefits of medications for prevention of heart attack, stroke and kidney damage; Daily salt intake goal < 2300 mg; Importance of home blood pressure  monitoring; -Counseled to monitor BP at home daily, document, and provide log at future appointments -Adherence data from 2021 showed 76.7% PDC for HTN medications - confirmed medications taking, will continue to follow for adherence purposed -Recommended to continue current medication, unclear if patient was adherent to all BP meds.  Have asked him to monitor and record BP closely and to contact me if it is consistently > 140/90.  Will initiate monitoring plan to follow BP.  Hypertriglyceridemia: (LDL goal < 70, TG < 150) -Controlled -Current treatment:  Rosuvastatin 51mg65mdications previously tried: none noted   -Educated on Cholesterol goals;  Benefits of statin for ASCVD risk reduction;  -patient still needs updated lipid panel -Recommended to continue current medication  Diabetes (A1c goal <7%) -Controlled -Current medications: . None -Medications previously tried: none noted   -Denies hypoglycemic/hyperglycemic symptoms -Current meal patterns: patient is watching portion sizes -Current exercise: minimal -Educated on A1c and blood sugar goals; -Counseled to check feet daily and get yearly eye exams -Recommended continue current management strategies  Atrial Flutter(Goal: Control HR) -Controlled -Current treatment   Eliquis 5mg t11me daily -Medications previously tried: none noted -Assessed patient finances. He presents to the office today with this as main concern.  Brings in paperwork for eliquis PAP program through [employer].  Paperwork completed and patient signatures obtained.  Will have Dr. PickarDennard SchaumannRx portion.  They have still not met 3% OOP spend requirement, but provided the number they need to reach and they will contact me once they have done this. -Denies abnormal bleeding or bruising. -Currently taking as prescribed. -Recommend continue current medications.  11/28/20 Patient has still not med 3% OOP.  Continues to be able to afford medication. Continue to  watch Scr.  Due to age if Scr > 1.5 patient would need to have dose of Eliquis decreased to 2.5mg BI69mRecommend continue current medications for now, contact me when 3% OOP has been met.  Patient Goals/Self-Care Activities . Over the next 180 days, patient will:  - take medications as prescribed check blood pressure daily, document, and provide at future appointments collaborate with provider on medication access solutions  Follow Up Plan: The care management team will reach out to the patient again over the next 180 days.             Medication Assistance: Application for Eliquis  medication assistance program. in process.  Anticipated assistance start date unknown.  Patient must meet 3% OOP to qualify..  See plan of care for additional detail.  [facility] [address] [phone] (MAIL [place]Galva[place] [phone] box? Yes Pt endorses 100% compliance  We discussed:  Benefits of medication synchronization, packaging and delivery as well as enhanced pharmacist oversight with [employer]. Patient decided to: Continue current medication management strategy  Care Plan and Follow Up Patient Decision:  Patient agrees to Care Plan and Follow-up.  Plan: The care management team will reach out to the patient again over the next 180 days.  Beverly Milch, PharmD Clinical Pharmacist [place] [phone]

## 2020-11-28 ENCOUNTER — Ambulatory Visit (INDEPENDENT_AMBULATORY_CARE_PROVIDER_SITE_OTHER): Payer: PPO | Admitting: Pharmacist

## 2020-11-28 ENCOUNTER — Encounter: Payer: Self-pay | Admitting: Family Medicine

## 2020-11-28 DIAGNOSIS — I10 Essential (primary) hypertension: Secondary | ICD-10-CM

## 2020-11-28 DIAGNOSIS — I4892 Unspecified atrial flutter: Secondary | ICD-10-CM

## 2020-11-28 NOTE — Patient Instructions (Addendum)
Visit Information  Goals Addressed            This Visit's Progress   . Manage My Medicine       Timeframe:  Long-Range Goal Priority:  High Start Date:  10/11/20                           Expected End Date: 04/13/21                      Follow Up Date 12/07/20   - call for medicine refill 2 or 3 days before it runs out - keep a list of all the medicines I take; vitamins and herbals too - use a pillbox to sort medicine    Why is this important?   . These steps will help you keep on track with your medicines.   Notes: 11/29/20 - still has not met 3% OOP    . Track and Manage My Blood Pressure-Hypertension   On track    Timeframe:  Long-Range Goal Priority:  High Start Date:   11/29/20                          Expected End Date:    05/31/21                   Follow Up Date 03/09/21   - check blood pressure daily - choose a place to take my blood pressure (home, clinic or office, retail store) - write blood pressure results in a log or diary    Why is this important?    You won't feel high blood pressure, but it can still hurt your blood vessels.   High blood pressure can cause heart or kidney problems. It can also cause a stroke.   Making lifestyle changes like losing a little weight or eating less salt will help.   Checking your blood pressure at home and at different times of the day can help to control blood pressure.   If the doctor prescribes medicine remember to take it the way the doctor ordered.   Call the office if you cannot afford the medicine or if there are questions about it.     Notes:       Patient Care Plan: General Pharmacy (Adult)    Problem Identified: HTN, Type II DM w/ CKD, Hypertriglyceridemia, Atrial flutter   Priority: High  Onset Date: 10/11/2020    Long-Range Goal: Patient-Specific Goal   Start Date: 10/11/2020  Expected End Date: 04/13/2021  Recent Progress: On track  Priority: High  Note:   Current Barriers:  . Unable to  independently afford treatment regimen  Pharmacist Clinical Goal(s):  [name] Over the next 180 days, patient will verbalize ability to afford treatment regimen . maintain control of blood pressure as evidenced by home monitoring  . adhere to prescribed medication regimen as evidenced by fill dates . contact provider office for questions/concerns as evidenced notation of same in electronic health record through collaboration with PharmD and provider.   Interventions: . 1:1 collaboration with [name], MD regarding development and update of comprehensive plan of care as evidenced by provider attestation and co-signature . Inter-disciplinary care team collaboration (see longitudinal plan of care) . Comprehensive medication review performed; medication list updated in electronic medical record  Hypertension (BP goal <130/80) -Controlled -Current treatment:  Amlodipine $RemoveBef'10mg'GDbRumUfCb$   Atenolol $RemoveB'25mg'OimpKlNu$  one-half tablet po  daily  Hydralazine 23m three tablets po tid  Lisinopril 437m-Medications previously tried: ramipril (cough), isosorbide  -Current home readings: 163/70 55P, 160/73 54P, 163/78 61P -Denies hypotensive/hypertensive symptoms -Educated on BP goals and benefits of medications for prevention of heart attack, stroke and kidney damage; Daily salt intake goal < 2300 mg; Importance of home blood pressure monitoring; -Counseled to monitor BP at home daily, document, and provide log at future appointments -Adherence data from 2021 showed 76.7% PDC for HTN medications - confirmed medications taking, will continue to follow for adherence purposed -Recommended to continue current medication, unclear if patient was adherent to all BP meds.  Have asked him to monitor and record BP closely and to contact me if it is consistently > 140/90.  Will initiate monitoring plan to follow BP.  Hypertriglyceridemia: (LDL goal < 70, TG < 150) -Controlled -Current treatment:  Rosuvastatin  4058mMedications previously tried: none noted   -Educated on Cholesterol goals;  Benefits of statin for ASCVD risk reduction;  -patient still needs updated lipid panel -Recommended to continue current medication  Diabetes (A1c goal <7%) -Controlled -Current medications: . None -Medications previously tried: none noted   -Denies hypoglycemic/hyperglycemic symptoms -Current meal patterns: patient is watching portion sizes -Current exercise: minimal -Educated on A1c and blood sugar goals; -Counseled to check feet daily and get yearly eye exams -Recommended continue current management strategies  Atrial Flutter(Goal: Control HR) -Controlled -Current treatment   Eliquis 5mg44mice daily -Medications previously tried: none noted -Assessed patient finances. He presents to the office today with this as main concern.  Brings in paperwork for eliquis PAP program through [employer].  Paperwork completed and patient signatures obtained.  Will have Dr. PickDennard Schaumannn Rx portion.  They have still not met 3% OOP spend requirement, but provided the number they need to reach and they will contact me once they have done this. -Denies abnormal bleeding or bruising. -Currently taking as prescribed. -Recommend continue current medications.  11/28/20 Patient has still not med 3% OOP.  Continues to be able to afford medication. Continue to watch Scr.  Due to age if Scr > 1.5 patient would need to have dose of Eliquis decreased to 2.5mg 52m. Recommend continue current medications for now, contact me when 3% OOP has been met.  Patient Goals/Self-Care Activities . Over the next 180 days, patient will:  - take medications as prescribed check blood pressure daily, document, and provide at future appointments collaborate with provider on medication access solutions  Follow Up Plan: The care management team will reach out to the patient again over the next 180 days.             The patient verbalized  understanding of instructions, educational materials, and care plan provided today and agreed to receive a mailed copy of patient instructions, educational materials, and care plan.  Telephone follow up appointment with pharmacy team member scheduled for: 6 months  ChrisEdy[facility]  [facility]ension, Adult High blood pressure (hypertension) is when the force of blood pumping through the arteries is too strong. The arteries are the blood vessels that carry blood from the heart throughout the body. Hypertension forces the heart to work harder to pump blood and may cause arteries to become narrow or stiff. Untreated or uncontrolled hypertension can cause a heart attack, heart failure, a stroke, kidney disease, and other problems. A blood pressure reading consists of a higher number over a lower number. Ideally, your blood pressure should be below 120/80. The first ("  top") number is called the systolic pressure. It is a measure of the pressure in your arteries as your heart beats. The second ("bottom") number is called the diastolic pressure. It is a measure of the pressure in your arteries as the heart relaxes. What are the causes? The exact cause of this condition is not known. There are some conditions that result in or are related to high blood pressure. What increases the risk? Some risk factors for high blood pressure are under your control. The following factors may make you more likely to develop this condition:  Smoking.  Having type 2 diabetes mellitus, high cholesterol, or both.  Not getting enough exercise or physical activity.  Being overweight.  Having too much fat, sugar, calories, or salt (sodium) in your diet.  Drinking too much alcohol. Some risk factors for high blood pressure may be difficult or impossible to change. Some of these factors include:  Having chronic kidney disease.  Having a family history of high blood pressure.  Age. Risk increases with age.  Race.  You may be at higher risk if you are African American.  Gender. Men are at higher risk than women before age 58. After age 58, women are at higher risk than men.  Having obstructive sleep apnea.  Stress. What are the signs or symptoms? High blood pressure may not cause symptoms. Very high blood pressure (hypertensive crisis) may cause:  Headache.  Anxiety.  Shortness of breath.  Nosebleed.  Nausea and vomiting.  Vision changes.  Severe chest pain.  Seizures. How is this diagnosed? This condition is diagnosed by measuring your blood pressure while you are seated, with your arm resting on a flat surface, your legs uncrossed, and your feet flat on the floor. The cuff of the blood pressure monitor will be placed directly against the skin of your upper arm at the level of your heart. It should be measured at least twice using the same arm. Certain conditions can cause a difference in blood pressure between your right and left arms. Certain factors can cause blood pressure readings to be lower or higher than normal for a short period of time:  When your blood pressure is higher when you are in a health care provider's office than when you are at home, this is called white coat hypertension. Most people with this condition do not need medicines.  When your blood pressure is higher at home than when you are in a health care provider's office, this is called masked hypertension. Most people with this condition may need medicines to control blood pressure. If you have a high blood pressure reading during one visit or you have normal blood pressure with other risk factors, you may be asked to:  Return on a different day to have your blood pressure checked again.  Monitor your blood pressure at home for 1 week or longer. If you are diagnosed with hypertension, you may have other blood or imaging tests to help your health care provider understand your overall risk for other conditions. How  is this treated? This condition is treated by making healthy lifestyle changes, such as eating healthy foods, exercising more, and reducing your alcohol intake. Your health care provider may prescribe medicine if lifestyle changes are not enough to get your blood pressure under control, and if:  Your systolic blood pressure is above 130.  Your diastolic blood pressure is above 80. Your personal target blood pressure may vary depending on your medical conditions, your age, and  other factors. Follow these instructions at home: Eating and drinking  Eat a diet that is high in fiber and potassium, and low in sodium, added sugar, and fat. An example eating plan is called the DASH (Dietary Approaches to Stop Hypertension) diet. To eat this way: ? Eat plenty of fresh fruits and vegetables. Try to fill one half of your plate at each meal with fruits and vegetables. ? Eat whole grains, such as whole-wheat pasta, brown rice, or whole-grain bread. Fill about one fourth of your plate with whole grains. ? Eat or drink low-fat dairy products, such as skim milk or low-fat yogurt. ? Avoid fatty cuts of meat, processed or cured meats, and poultry with skin. Fill about one fourth of your plate with lean proteins, such as fish, chicken without skin, beans, eggs, or tofu. ? Avoid pre-made and processed foods. These tend to be higher in sodium, added sugar, and fat.  Reduce your daily sodium intake. Most people with hypertension should eat less than 1,500 mg of sodium a day.  Do not drink alcohol if: ? Your health care provider tells you not to drink. ? You are pregnant, may be pregnant, or are planning to become pregnant.  If you drink alcohol: ? Limit how much you use to:  0-1 drink a day for women.  0-2 drinks a day for men. ? Be aware of how much alcohol is in your drink. In the [place] one drink equals one 12 oz bottle of beer (355 mL), one 5 oz glass of wine (148 mL), or one 1 oz glass of hard liquor (44  mL).   Lifestyle  Work with your health care provider to maintain a healthy body weight or to lose weight. Ask what an ideal weight is for you.  Get at least 30 minutes of exercise most days of the week. Activities may include walking, swimming, or biking.  Include exercise to strengthen your muscles (resistance exercise), such as Pilates or lifting weights, as part of your weekly exercise routine. Try to do these types of exercises for 30 minutes at least 3 days a week.  Do not use any products that contain nicotine or tobacco, such as cigarettes, e-cigarettes, and chewing tobacco. If you need help quitting, ask your health care provider.  Monitor your blood pressure at home as told by your health care provider.  Keep all follow-up visits as told by your health care provider. This is important.   Medicines  Take over-the-counter and prescription medicines only as told by your health care provider. Follow directions carefully. Blood pressure medicines must be taken as prescribed.  Do not skip doses of blood pressure medicine. Doing this puts you at risk for problems and can make the medicine less effective.  Ask your health care provider about side effects or reactions to medicines that you should watch for. Contact a health care provider if you:  Think you are having a reaction to a medicine you are taking.  Have headaches that keep coming back (recurring).  Feel dizzy.  Have swelling in your ankles.  Have trouble with your vision. Get help right away if you:  Develop a severe headache or confusion.  Have unusual weakness or numbness.  Feel faint.  Have severe pain in your chest or abdomen.  Vomit repeatedly.  Have trouble breathing. Summary  Hypertension is when the force of blood pumping through your arteries is too strong. If this condition is not controlled, it may put you at risk for  serious complications.  Your personal target blood pressure may vary depending  on your medical conditions, your age, and other factors. For most people, a normal blood pressure is less than 120/80.  Hypertension is treated with lifestyle changes, medicines, or a combination of both. Lifestyle changes include losing weight, eating a healthy, low-sodium diet, exercising more, and limiting alcohol. This information is not intended to replace advice given to you by your health care provider. Make sure you discuss any questions you have with your health care provider. Document Revised: 04/06/2018 Document Reviewed: 04/06/2018 Elsevier Patient Education  2021 [employer].

## 2020-12-03 ENCOUNTER — Other Ambulatory Visit: Payer: Self-pay | Admitting: Family Medicine

## 2020-12-08 NOTE — Progress Notes (Signed)
Cardiology Office Note   Date:  12/09/2020   ID:  Eric Hough., DOB 1927/09/15, MRN [id number]  PCP:  [name], MD  Cardiologist:   Dorris Carnes, MD   Pt presents fro f/u of CAD      History of Present Illness: Eric Gallo. is a 87 y.o. male with a history of CAD   He is s/p CABG in 2000 (LIMA to LAD; SVG to Diag; SVG to PDA)   In 2003 he underwent PTCA of LAD The pt also has a hx of HTN, permanent atrial fib; CKD Stage III, DM, PAD (followed at [facility]; s/p L BKA )  The pt was seen in ED in July 2021 for severe elevation of BP (200s)  Note he had not been on his amlodipine.   That was restarted at 5 mg daily     The pt was last in clinic in September  2021  Patient says since seen he has done okay.  His breathing is stable.  He denies chest pain.  No dizziness.   Current Meds  Medication Sig  . acetaminophen (TYLENOL) 325 MG tablet Take 2 tablets (650 mg total) by mouth every 6 (six) hours as needed for mild pain (or temp >/= 101 F).  [name] amLODipine (NORVASC) 10 MG tablet Take 1 tablet (10 mg total) by mouth daily.  [name] atenolol (TENORMIN) 25 MG tablet Take 0.5 tablets (12.5 mg total) by mouth daily.  . B Complex Vitamins (B COMPLEX-B12) TABS Take 1 tablet by mouth daily.   . cholecalciferol (VITAMIN D) 1000 UNITS tablet Take 1,000 Units by mouth 2 (two) times a week. Takes on Mon and Fri  . Cinnamon 500 MG capsule Take 1,000 mg by mouth 2 (two) times daily.  [name] ELIQUIS 5 MG TABS tablet Take 1 tablet by mouth twice daily  . ferrous sulfate 325 (65 FE) MG EC tablet Take 325 mg by mouth 3 (three) times daily with meals.  . finasteride (PROSCAR) 5 MG tablet TAKE 1 TABLET BY MOUTH ONCE DAILY  . fish oil-omega-3 fatty acids 1000 MG capsule Take 1,000 mg by mouth daily.  . furosemide (LASIX) 40 MG tablet TAKE 1 TABLET BY MOUTH ONCE DAILY AS NEEDED FOR  LEG  SWELLING  . hydrALAZINE (APRESOLINE) 25 MG tablet TAKE 3 TABLETS BY MOUTH EVERY 8 HOURS  . lisinopril (ZESTRIL) 40  MG tablet Take 1 tablet by mouth once daily  . Multiple Vitamin (DAILY MULTIVITAMIN PO) Take 1 tablet by mouth daily.  [name] ULTRA test strip USE 1 STRIP TO CHECK GLUCOSE ONCE DAILY  . pentoxifylline (TRENTAL) 400 MG CR tablet Take 1 tablet (400 mg total) by mouth every 12 (twelve) hours.  . rosuvastatin (CRESTOR) 40 MG tablet TAKE 1 TABLET BY MOUTH ONCE DAILY (CALL  CLINIC  TO  SCHEDULE  AN  APPOINTMENT  FOR  FUTURE  REFILLS)  . saccharomyces boulardii (FLORASTOR) 250 MG capsule Take 1 capsule (250 mg total) by mouth daily.  . tamsulosin (FLOMAX) 0.4 MG CAPS capsule Take 1 capsule by mouth once daily     Allergies:   Isosorbide mononitrate, Ramipril, Quinidine gluconate, and Quinidine gluconate   Past Medical History:  Diagnosis Date  . Atrial flutter ([place])    s/p RFCA  . CAD (coronary artery disease)    cath 2003, occluded S-RCA, occluded S-Dx, L-LAD ok, s/p PTCA to LAD  . Cataract   . CKD (chronic kidney disease) stage 3, GFR 30-59 ml/min (  [place])   . Diabetes mellitus    diet controlled  . Hearing aid worn    bilateral  . History of kidney stones   . Long term (current) use of anticoagulants   . Neuromuscular disorder ([place])   . OSA (obstructive sleep apnea) 12/11   very mild, AHI 7/hr  . Persistent atrial fibrillation ([place]) 09/26/2015  . Pure hyperglyceridemia   . PVD (peripheral vascular disease) ([place])    angioplasty of his right lower extremity in [place] by Dr.[name] 2013  . Unspecified essential hypertension   . Wears dentures    full upper and lower    Past Surgical History:  Procedure Laterality Date  . ABDOMINAL AORTOGRAM W/LOWER EXTREMITY N/A 11/15/2017   Procedure: ABDOMINAL AORTOGRAM W/LOWER EXTREMITY;  Surgeon: [name], MD;  Location: [place] [facility];  Service: Cardiovascular;  Laterality: N/A;  . Adenosine Myoview  3/06   EF 56%, neg. Ischemia  . Adenosine Myoview  02/18/07   nml  . AMPUTATION Left 12/15/2017   Procedure: AMPUTATION BELOW KNEE;   Surgeon: Algernon [name], MD;  Location: [facility];  Service: Vascular;  Laterality: Left;  . ANGIOPLASTY  1/99   CAD- diogonal with rotational artherectomy  . Arthrectomy     of LAD & PTCA  . BLEPHAROPLASTY Bilateral   . CARDIAC CATHETERIZATION  1/00  . CARDIOVERSION  1/04  . CARDIOVERSION  5/07   hospital- a flutter  . CARPAL TUNNEL RELEASE     ? bilateral  . CATARACT EXTRACTION W/PHACO Right 07/28/2017   Procedure: CATARACT EXTRACTION PHACO AND INTRAOCULAR LENS PLACEMENT ([place]) RIGHT DIABETIC;  Surgeon: [name], MD;  Location: [facility];  Service: Ophthalmology;  Laterality: Right;  Diabetic - diet controlled  . CATARACT EXTRACTION W/PHACO Left 08/18/2017   Procedure: CATARACT EXTRACTION PHACO AND INTRAOCULAR LENS PLACEMENT ([facility]);  Surgeon: [name], MD;  Location: [place];  Service: Ophthalmology;  Laterality: Left;  DIABETES - oral meds  . COLONOSCOPY W/ BIOPSIES  10/01/06   sigmoid polyp bx neg, 3 years  . CORONARY ANGIOPLASTY  4/03   cutting balloon PTCA pLAD into Diag  . CORONARY ARTERY BYPASS GRAFT  2000   LIMA-LAD, SVG-RCA, SVG-Diag; SVG-Diag & SVG-RCA occluded 2003  . FRACTURE SURGERY    . HAND SURGERY  08/27/09   R thumb procedure wit Scaphoid Gragt and screws, Dr [name]  . LOWER EXTREMITY ANGIOGRAPHY Right 01/25/2019   Procedure: LOWER EXTREMITY ANGIOGRAPHY;  Surgeon: Katha Cabal, MD;  Location: [place] [facility];  Service: Cardiovascular;  Laterality: Right;  . NM MYOVIEW LTD  4/11   normal  . PERIPHERAL VASCULAR CATHETERIZATION Right 06/27/2015   Procedure: Lower Extremity Angiography;  Surgeon: Algernon [name], MD;  Location: [place] [facility];  Service: Cardiovascular;  Laterality: Right;  . PERIPHERAL VASCULAR CATHETERIZATION  06/27/2015   Procedure: Lower Extremity Intervention;  Surgeon: Algernon [name], MD;  Location: [place] [facility];  Service: Cardiovascular;;     Social History:  The patient  reports that he has  never smoked. He quit smokeless tobacco use about 28 years ago. He reports that he does not drink alcohol and does not use drugs.   Family History:  The patient's family history includes Aneurysm in his father; Breast cancer in his sister; Hypertension in his father and mother; Liver cancer in his brother; Lung cancer in his brother and brother; Other in his brother and sister; Prostate cancer in his brother; Stroke in his father; Thrombosis  in his brother.    ROS:  Please see the history of present illness. All other systems are reviewed and  Negative to the above problem except as noted.    PHYSICAL EXAM: VS:  BP 140/72   Pulse 61   Ht 5\' 5"  (1.651 m)   Wt 170 lb 6.4 oz (77.3 kg)   SpO2 97%   BMI 28.36 kg/m   GEN: Well nourished, well developed, in no acute distress  \ Neck: no JVD, carotid bruits Cardiac: RRR; no murmurs   No LE edema  Respiratory:  clear to auscultation bilaterally, GII: soft, nontender, nondistended, + BS  No hepatomegaly  MS:  S/p L BKA   Skin: warm and dry, no rash Neuro:  Strength and sensation are intact Psych: euthymic mood, full affect   EKG:  EKG is not ordered today.    Lipid Panel    Component Value Date/Time   CHOL 98 05/03/2019 0840   TRIG 120 05/03/2019 0840   HDL 33 (L) 05/03/2019 0840   CHOLHDL 3.0 05/03/2019 0840   VLDL 39 (H) 03/01/2017 0828   LDLCALC 44 05/03/2019 0840   LDLDIRECT 99.1 02/19/2011 0823      Wt Readings from Last 3 Encounters:  12/09/20 170 lb 6.4 oz (77.3 kg)  09/11/20 165 lb (74.8 kg)  08/14/20 172 lb (78 kg)      ASSESSMENT AND PLAN:  1  CAD   remote CABG.  Remote intervention (2003).  The pt has no symptoms to sugg angain   Keep on current regimen  Follow    2  Bradycardia heart rate appears okay on current regimen.  Would continue. Pt is without dizziness.  3  HTN  BP is adequate for age.  No dizziness.    4 atrial fibrillation.  Will check CBC.  Remains on Eliquis.  HL we will check lipids  today.  5  PAD   Followed at [facility].  Check lipids.  Plan for follow-up in the winter.  We will also check A1c and TSH.    Current medicines are reviewed at length with the patient today.  The patient does not have concerns regarding medicines.  Signed, Dorris Carnes, MD  12/09/2020 11:38 AM    [address] [place] [address], [place], [place]  [address] Phone: [phone]; Fax: [phone]

## 2020-12-09 ENCOUNTER — Ambulatory Visit: Payer: PPO | Admitting: Internal Medicine

## 2020-12-09 ENCOUNTER — Encounter: Payer: Self-pay | Admitting: Internal Medicine

## 2020-12-09 ENCOUNTER — Other Ambulatory Visit: Payer: Self-pay

## 2020-12-09 VITALS — BP 140/72 | HR 61 | Ht 65.0 in | Wt 170.4 lb

## 2020-12-09 DIAGNOSIS — E782 Mixed hyperlipidemia: Secondary | ICD-10-CM | POA: Diagnosis not present

## 2020-12-09 DIAGNOSIS — I10 Essential (primary) hypertension: Secondary | ICD-10-CM | POA: Diagnosis not present

## 2020-12-09 DIAGNOSIS — I2510 Atherosclerotic heart disease of native coronary artery without angina pectoris: Secondary | ICD-10-CM

## 2020-12-09 DIAGNOSIS — Z79899 Other long term (current) drug therapy: Secondary | ICD-10-CM

## 2020-12-09 DIAGNOSIS — I4891 Unspecified atrial fibrillation: Secondary | ICD-10-CM | POA: Diagnosis not present

## 2020-12-09 NOTE — Patient Instructions (Signed)
Medication Instructions:  Your physician recommends that you continue on your current medications as directed. Please refer to the Current Medication list given to you today.  *If you need a refill on your cardiac medications before your next appointment, please call your pharmacy*   Lab Work: CBC,BMET,LIPID,PRO BNP, HGBA1C, TSH, VIT D If you have labs (blood work) drawn today and your tests are completely normal, you will receive your results only by: [name] MyChart Message (if you have MyChart) OR . A paper copy in the mail If you have any lab test that is abnormal or we need to change your treatment, we will call you to review the results.   Testing/Procedures: NONE   Follow-Up: At [facility], you and your health needs are our priority.  As part of our continuing mission to provide you with exceptional heart care, we have created designated Provider Care Teams.  These Care Teams include your primary Cardiologist (physician) and Advanced Practice Providers (APPs -  Physician Assistants and Nurse Practitioners) who all work together to provide you with the care you need, when you need it.  We recommend signing up for the patient portal called "MyChart".  Sign up information is provided on this After Visit Summary.  MyChart is used to connect with patients for Virtual Visits (Telemedicine).  Patients are able to view lab/test results, encounter notes, upcoming appointments, etc.  Non-urgent messages can be sent to your provider as well.   To learn more about what you can do with MyChart, go to [email or web address].    Your next appointment:   7 month(s)  The format for your next appointment:   In Person  Provider:   Dorris Carnes, MD   Other Instructions

## 2020-12-10 LAB — CBC
Hematocrit: 33.2 % — ABNORMAL LOW (ref 37.5–51.0)
Hemoglobin: 10.7 g/dL — ABNORMAL LOW (ref 13.0–17.7)
MCH: 29.4 pg (ref 26.6–33.0)
MCHC: 32.2 g/dL (ref 31.5–35.7)
MCV: 91 fL (ref 79–97)
Platelets: 158 10*3/uL (ref 150–450)
RBC: 3.64 x10E6/uL — ABNORMAL LOW (ref 4.14–5.80)
RDW: 14.4 % (ref 11.6–15.4)
WBC: 9 10*3/uL (ref 3.4–10.8)

## 2020-12-10 LAB — HEMOGLOBIN A1C
Est. average glucose Bld gHb Est-mCnc: 117 mg/dL
Hgb A1c MFr Bld: 5.7 % — ABNORMAL HIGH (ref 4.8–5.6)

## 2020-12-10 LAB — PRO B NATRIURETIC PEPTIDE: NT-Pro BNP: 1983 pg/mL — ABNORMAL HIGH (ref 0–486)

## 2020-12-10 LAB — LIPID PANEL
Chol/HDL Ratio: 2.8 ratio (ref 0.0–5.0)
Cholesterol, Total: 83 mg/dL — ABNORMAL LOW (ref 100–199)
HDL: 30 mg/dL — ABNORMAL LOW (ref >39)
LDL Chol Calc (NIH): 33 mg/dL (ref 0–99)
Triglycerides: 108 mg/dL (ref 0–149)
VLDL Cholesterol Cal: 20 mg/dL (ref 5–40)

## 2020-12-10 LAB — TSH: TSH: 6.3 u[IU]/mL — ABNORMAL HIGH (ref 0.450–4.500)

## 2020-12-10 LAB — VITAMIN D 25 HYDROXY (VIT D DEFICIENCY, FRACTURES): Vit D, 25-Hydroxy: 40 ng/mL (ref 30.0–100.0)

## 2020-12-11 NOTE — Addendum Note (Signed)
Addended by: Willeen Cass on: 12/11/2020 08:47 AM   Modules accepted: Orders

## 2020-12-21 ENCOUNTER — Encounter: Payer: Self-pay | Admitting: Family Medicine

## 2020-12-30 ENCOUNTER — Other Ambulatory Visit: Payer: PPO | Admitting: *Deleted

## 2020-12-30 ENCOUNTER — Other Ambulatory Visit: Payer: Self-pay

## 2020-12-30 DIAGNOSIS — Z79899 Other long term (current) drug therapy: Secondary | ICD-10-CM

## 2020-12-30 DIAGNOSIS — D1801 Hemangioma of skin and subcutaneous tissue: Secondary | ICD-10-CM | POA: Diagnosis not present

## 2020-12-30 DIAGNOSIS — Z85828 Personal history of other malignant neoplasm of skin: Secondary | ICD-10-CM | POA: Diagnosis not present

## 2020-12-30 DIAGNOSIS — L570 Actinic keratosis: Secondary | ICD-10-CM | POA: Diagnosis not present

## 2020-12-30 LAB — BASIC METABOLIC PANEL
BUN/Creatinine Ratio: 17 (ref 10–24)
BUN: 28 mg/dL (ref 10–36)
CO2: 19 mmol/L — ABNORMAL LOW (ref 20–29)
Calcium: 8.9 mg/dL (ref 8.6–10.2)
Chloride: 104 mmol/L (ref 96–106)
Creatinine, Ser: 1.69 mg/dL — ABNORMAL HIGH (ref 0.76–1.27)
Glucose: 116 mg/dL — ABNORMAL HIGH (ref 65–99)
Potassium: 4.6 mmol/L (ref 3.5–5.2)
Sodium: 138 mmol/L (ref 134–144)
eGFR: 38 mL/min/{1.73_m2} — ABNORMAL LOW (ref >59)

## 2020-12-30 LAB — T4, FREE: Free T4: 0.95 ng/dL (ref 0.82–1.77)

## 2020-12-30 LAB — T3, FREE: T3, Free: 2.6 pg/mL (ref 2.0–4.4)

## 2020-12-30 LAB — PRO B NATRIURETIC PEPTIDE: NT-Pro BNP: 2570 pg/mL — ABNORMAL HIGH (ref 0–486)

## 2020-12-31 ENCOUNTER — Telehealth: Payer: Self-pay | Admitting: Pharmacist

## 2020-12-31 ENCOUNTER — Ambulatory Visit (INDEPENDENT_AMBULATORY_CARE_PROVIDER_SITE_OTHER): Payer: PPO | Admitting: Family Medicine

## 2020-12-31 ENCOUNTER — Encounter: Payer: Self-pay | Admitting: Family Medicine

## 2020-12-31 VITALS — BP 130/68 | HR 66 | Temp 98.7°F | Resp 16 | Ht 65.0 in | Wt 169.0 lb

## 2020-12-31 DIAGNOSIS — E1152 Type 2 diabetes mellitus with diabetic peripheral angiopathy with gangrene: Secondary | ICD-10-CM | POA: Diagnosis not present

## 2020-12-31 DIAGNOSIS — I482 Chronic atrial fibrillation, unspecified: Secondary | ICD-10-CM | POA: Diagnosis not present

## 2020-12-31 DIAGNOSIS — I10 Essential (primary) hypertension: Secondary | ICD-10-CM

## 2020-12-31 DIAGNOSIS — L84 Corns and callosities: Secondary | ICD-10-CM

## 2020-12-31 DIAGNOSIS — E11628 Type 2 diabetes mellitus with other skin complications: Secondary | ICD-10-CM

## 2020-12-31 DIAGNOSIS — E781 Pure hyperglyceridemia: Secondary | ICD-10-CM | POA: Diagnosis not present

## 2020-12-31 NOTE — Progress Notes (Addendum)
Chronic Care Management Pharmacy Assistant   Name: Eric [name].  MRN: [id number] DOB: Sep 05, 1927  Reason for Encounter: Disease State For HTN.   Conditions to be addressed/monitored: HTN, CAD, Type 2 DM.  Recent office visits:  12/31/20 [name] For follow-up. Labs drawn. No medication changes.   Recent consult visits:  12/09/20 Cardiology Fay Records, MD. For follow-up. Labs drawn. No medication changes.  Hospital visits:  None since 11/28/20   Medications: Outpatient Encounter Medications as of 12/31/2020  Medication Sig   acetaminophen (TYLENOL) 325 MG tablet Take 2 tablets (650 mg total) by mouth every 6 (six) hours as needed for mild pain (or temp >/= 101 F).   amLODipine (NORVASC) 10 MG tablet Take 1 tablet (10 mg total) by mouth daily.   atenolol (TENORMIN) 25 MG tablet Take 0.5 tablets (12.5 mg total) by mouth daily.   B Complex Vitamins (B COMPLEX-B12) TABS Take 1 tablet by mouth daily.    cholecalciferol (VITAMIN D) 1000 UNITS tablet Take 1,000 Units by mouth 2 (two) times a week. Takes on Mon and Fri   Cinnamon 500 MG capsule Take 1,000 mg by mouth 2 (two) times daily.   ELIQUIS 5 MG TABS tablet Take 1 tablet by mouth twice daily   ferrous sulfate 325 (65 FE) MG EC tablet Take 325 mg by mouth 3 (three) times daily with meals.   finasteride (PROSCAR) 5 MG tablet TAKE 1 TABLET BY MOUTH ONCE DAILY   fish oil-omega-3 fatty acids 1000 MG capsule Take 1,000 mg by mouth daily.   furosemide (LASIX) 40 MG tablet TAKE 1 TABLET BY MOUTH ONCE DAILY AS NEEDED FOR  LEG  SWELLING   hydrALAZINE (APRESOLINE) 25 MG tablet TAKE 3 TABLETS BY MOUTH EVERY 8 HOURS   lisinopril (ZESTRIL) 40 MG tablet Take 1 tablet by mouth once daily   Multiple Vitamin (DAILY MULTIVITAMIN PO) Take 1 tablet by mouth daily.   ONETOUCH ULTRA test strip USE 1 STRIP TO CHECK GLUCOSE ONCE DAILY   pentoxifylline (TRENTAL) 400 MG CR tablet Take 1 tablet (400 mg total) by mouth every 12 (twelve) hours.    rosuvastatin (CRESTOR) 40 MG tablet TAKE 1 TABLET BY MOUTH ONCE DAILY (CALL  CLINIC  TO  SCHEDULE  AN  APPOINTMENT  FOR  FUTURE  REFILLS)   saccharomyces boulardii (FLORASTOR) 250 MG capsule Take 1 capsule (250 mg total) by mouth daily.   tamsulosin (FLOMAX) 0.4 MG CAPS capsule Take 1 capsule by mouth once daily   No facility-administered encounter medications on file as of 12/31/2020.    Reviewed chart prior to disease state call. Spoke with patient regarding BP  Recent Office Vitals: BP Readings from Last 3 Encounters:  12/31/20 130/68  12/09/20 140/72  09/11/20 (!) 143/66   Pulse Readings from Last 3 Encounters:  12/31/20 66  12/09/20 61  09/11/20 (!) 49    Wt Readings from Last 3 Encounters:  12/31/20 169 lb (76.7 kg)  12/09/20 170 lb 6.4 oz (77.3 kg)  09/11/20 165 lb (74.8 kg)     Kidney Function Lab Results  Component Value Date/Time   CREATININE 1.69 (H) 12/30/2020 11:59 AM   CREATININE 1.12 07/11/2020 03:09 PM   CREATININE 1.26 (H) 02/22/2020 10:20 AM   CREATININE 1.76 (H) 10/12/2019 02:44 PM   GFR 48.95 (L) 10/10/2013 08:59 AM   GFRNONAA >60 07/11/2020 03:09 PM   GFRNONAA 50 (L) 02/22/2020 10:20 AM   GFRAA 58 (L) 02/22/2020 10:20 AM  BMP Latest Ref Rng & Units 12/30/2020 07/11/2020 02/22/2020  Glucose 65 - 99 mg/dL 116(H) 86 95  BUN 10 - 36 mg/dL 28 21 18   Creatinine 0.76 - 1.27 mg/dL 1.69(H) 1.12 1.26(H)  BUN/Creat Ratio 10 - 24 17 - 14  Sodium 134 - 144 mmol/L 138 134(L) 139  Potassium 3.5 - 5.2 mmol/L 4.6 4.6 4.0  Chloride 96 - 106 mmol/L 104 106 106  CO2 20 - 29 mmol/L 19(L) 23 25  Calcium 8.6 - 10.2 mg/dL 8.9 8.4(L) 8.9    Current antihypertensive regimen:  Hydralazine 25 mg 3 tablets every 8 hours Lisinopril 40 mg 1 tablet daily   How often are you checking your Blood Pressure? Patient stated twice daily   Current home BP readings:  150/70 12/31/20  156/75 12/30/20 140/66 12/30/20 144/55 12/29/20  What recent interventions/DTPs have been made by  any provider to improve Blood Pressure control since last CPP Visit: None  Any recent hospitalizations or ED visits since last visit with CPP? Patient stated no.  What diet changes have been made to improve Blood Pressure Control?  Patient stated he watches what he eats. He stated he goes out to eat for breakfast most of the time and eats a full lunch and for dinner he snacks most of the time. He stated he drinks about 3-4 glasses of water daily.   What exercise is being done to improve your Blood Pressure Control?  Patient stated he walks around sometimes.   Adherence Review: Is the patient currently on ACE/ARB medication? Lisinopril 40 mg    Does the patient have >5 day gap between last estimated fill dates? Per misc rpts, no.  Star Rating Drugs: Lisinopril 40 mg 90 DS 11/15/20 Rosuvastatin 40 mg 90 DS 10/25/20  Follow-Up:Pharmacist Review  Patient stated he does not have any current questions or concerns about his medications at this time but he did want to drop off some paperwork for chris the pharmacist to look at. He planned on dropping it off Friday 01/03/21  Charlann Lange, [place] Pharmacist Assistant [phone]  10 minutes spent in review, coordination, and documentation.  Reviewed by: Beverly Milch, PharmD Clinical Pharmacist [facility] [phone]

## 2020-12-31 NOTE — Progress Notes (Signed)
Subjective:    Patient ID: Eric [name]., male    DOB: 1927/08/31, 87 y.o.   MRN: [id number]  HPI  Patient is a very pleasant 87 year old Caucasian gentleman with a history of a below the knee amputation in the left leg due to a nonhealing ulcer on his left foot and osteomyelitis.  He also has diabetes mellitus.  He still has his right leg.  However on diabetic foot exam today, he has a large preulcerative callus or corn forming on the plantar aspect of the fifth MTP joint.  I debrided a large amount of hyperkeratotic tissue from the plantar surface of the joint using a razor blade.  However the patient has no sensation in his feet and therefore he is at high risk for complications from this.  He has not seen a podiatrist or wound care specialist in over a year.  Otherwise he is doing well.  He is in atrial fibrillation today but his heart rate is well controlled and he is appropriately anticoagulated.  He denies any chest pain shortness of breath or dyspnea on exertion.  He has been checking his blood sugars and his blood sugars are typically between 101 111.  Blood pressure today is excellent. Past Medical History:  Diagnosis Date  . Atrial flutter ([place])    s/p RFCA  . CAD (coronary artery disease)    cath 2003, occluded S-RCA, occluded S-Dx, L-LAD ok, s/p PTCA to LAD  . Cataract   . CKD (chronic kidney disease) stage 3, GFR 30-59 ml/min (HCC)   . Diabetes mellitus    diet controlled  . Hearing aid worn    bilateral  . History of kidney stones   . Long term (current) use of anticoagulants   . Neuromuscular disorder ([place])   . OSA (obstructive sleep apnea) 12/11   very mild, AHI 7/hr  . Persistent atrial fibrillation ([place]) 09/26/2015  . Pure hyperglyceridemia   . PVD (peripheral vascular disease) ([place])    angioplasty of his right lower extremity in [place] by Dr.[name] 2013  . Unspecified essential hypertension   . Wears dentures    full upper and lower   Past Surgical History:   Procedure Laterality Date  . ABDOMINAL AORTOGRAM W/LOWER EXTREMITY N/A 11/15/2017   Procedure: ABDOMINAL AORTOGRAM W/LOWER EXTREMITY;  Surgeon: [name], MD;  Location: [place] [facility];  Service: Cardiovascular;  Laterality: N/A;  . Adenosine Myoview  3/06   EF 56%, neg. Ischemia  . Adenosine Myoview  02/18/07   nml  . AMPUTATION Left 12/15/2017   Procedure: AMPUTATION BELOW KNEE;  Surgeon: Algernon [name], MD;  Location: [facility];  Service: Vascular;  Laterality: Left;  . ANGIOPLASTY  1/99   CAD- diogonal with rotational artherectomy  . Arthrectomy     of LAD & PTCA  . BLEPHAROPLASTY Bilateral   . CARDIAC CATHETERIZATION  1/00  . CARDIOVERSION  1/04  . CARDIOVERSION  5/07   hospital- a flutter  . CARPAL TUNNEL RELEASE     ? bilateral  . CATARACT EXTRACTION W/PHACO Right 07/28/2017   Procedure: CATARACT EXTRACTION PHACO AND INTRAOCULAR LENS PLACEMENT ([place]) RIGHT DIABETIC;  Surgeon: [name], MD;  Location: [place];  Service: Ophthalmology;  Laterality: Right;  Diabetic - diet controlled  . CATARACT EXTRACTION W/PHACO Left 08/18/2017   Procedure: CATARACT EXTRACTION PHACO AND INTRAOCULAR LENS PLACEMENT ([facility]);  Surgeon: [name], MD;  Location: [place];  Service: Ophthalmology;  Laterality: Left;  DIABETES - oral  meds  . COLONOSCOPY W/ BIOPSIES  10/01/06   sigmoid polyp bx neg, 3 years  . CORONARY ANGIOPLASTY  4/03   cutting balloon PTCA pLAD into Diag  . CORONARY ARTERY BYPASS GRAFT  2000   LIMA-LAD, SVG-RCA, SVG-Diag; SVG-Diag & SVG-RCA occluded 2003  . FRACTURE SURGERY    . HAND SURGERY  08/27/09   R thumb procedure wit Scaphoid Gragt and screws, Dr [name]  . LOWER EXTREMITY ANGIOGRAPHY Right 01/25/2019   Procedure: LOWER EXTREMITY ANGIOGRAPHY;  Surgeon: Katha Cabal, MD;  Location: [place] [facility];  Service: Cardiovascular;  Laterality: Right;  . NM MYOVIEW LTD  4/11   normal  . PERIPHERAL VASCULAR CATHETERIZATION Right  06/27/2015   Procedure: Lower Extremity Angiography;  Surgeon: Algernon [name], MD;  Location: [place] [facility];  Service: Cardiovascular;  Laterality: Right;  . PERIPHERAL VASCULAR CATHETERIZATION  06/27/2015   Procedure: Lower Extremity Intervention;  Surgeon: Algernon [name], MD;  Location: [place] [facility];  Service: Cardiovascular;;   Current Outpatient Medications on File Prior to Visit  Medication Sig Dispense Refill  . acetaminophen (TYLENOL) 325 MG tablet Take 2 tablets (650 mg total) by mouth every 6 (six) hours as needed for mild pain (or temp >/= 101 F).    [name] amLODipine (NORVASC) 10 MG tablet Take 1 tablet (10 mg total) by mouth daily. 90 tablet 3  . atenolol (TENORMIN) 25 MG tablet Take 0.5 tablets (12.5 mg total) by mouth daily. 45 tablet 3  . B Complex Vitamins (B COMPLEX-B12) TABS Take 1 tablet by mouth daily.     . cholecalciferol (VITAMIN D) 1000 UNITS tablet Take 1,000 Units by mouth 2 (two) times a week. Takes on Mon and Fri    . Cinnamon 500 MG capsule Take 1,000 mg by mouth 2 (two) times daily.    [name] ELIQUIS 5 MG TABS tablet Take 1 tablet by mouth twice daily 180 tablet 0  . ferrous sulfate 325 (65 FE) MG EC tablet Take 325 mg by mouth 3 (three) times daily with meals.    . finasteride (PROSCAR) 5 MG tablet TAKE 1 TABLET BY MOUTH ONCE DAILY 90 tablet 3  . fish oil-omega-3 fatty acids 1000 MG capsule Take 1,000 mg by mouth daily.    . furosemide (LASIX) 40 MG tablet TAKE 1 TABLET BY MOUTH ONCE DAILY AS NEEDED FOR  LEG  SWELLING 30 tablet 0  . hydrALAZINE (APRESOLINE) 25 MG tablet TAKE 3 TABLETS BY MOUTH EVERY 8 HOURS 270 tablet 0  . lisinopril (ZESTRIL) 40 MG tablet Take 1 tablet by mouth once daily 90 tablet 0  . Multiple Vitamin (DAILY MULTIVITAMIN PO) Take 1 tablet by mouth daily.    [name] ULTRA test strip USE 1 STRIP TO CHECK GLUCOSE ONCE DAILY 50 each 0  . pentoxifylline (TRENTAL) 400 MG CR tablet Take 1 tablet (400 mg total) by mouth every 12 (twelve) hours. 180  tablet 0  . rosuvastatin (CRESTOR) 40 MG tablet TAKE 1 TABLET BY MOUTH ONCE DAILY (CALL  CLINIC  TO  SCHEDULE  AN  APPOINTMENT  FOR  FUTURE  REFILLS) 90 tablet 3  . saccharomyces boulardii (FLORASTOR) 250 MG capsule Take 1 capsule (250 mg total) by mouth daily. 90 capsule 3  . tamsulosin (FLOMAX) 0.4 MG CAPS capsule Take 1 capsule by mouth once daily 90 capsule 0   No current facility-administered medications on file prior to visit.   Allergies  Allergen Reactions  . Isosorbide Mononitrate Other (See  Comments)    Unknown, doesn't remember   . Ramipril Cough and Other (See Comments)  . Quinidine Gluconate Rash  . Quinidine Gluconate Other (See Comments) and Rash   Social History   Socioeconomic History  . Marital status: Married    Spouse name: Not on file  . Number of children: 5  . Years of education: Not on file  . Highest education level: Not on file  Occupational History  . Occupation: [employer]    Employer: RETIRED  Tobacco Use  . Smoking status: Never Smoker  . Smokeless tobacco: Former Network engineer  . Vaping Use: Never used  Substance and Sexual Activity  . Alcohol use: No  . Drug use: No  . Sexual activity: Never  Other Topics Concern  . Not on file  Social History Narrative   Retired now does some antiques   Retired from Theatre manager and dye work   Married 1951   5 kids   Social Determinants of Radio broadcast assistant Strain: [place]   . Difficulty of Paying Living Expenses: Not very hard  Food Insecurity: Not on file  Transportation Needs: Not on file  Physical Activity: Not on file  Stress: Not on file  Social Connections: Not on file  Intimate Partner Violence: Not on file      Review of Systems  All other systems reviewed and are negative.      Objective:   Physical Exam Vitals reviewed.  Constitutional:      Appearance: Normal appearance.  Cardiovascular:     Rate and Rhythm: Normal rate. Rhythm irregular.     Pulses: Normal  pulses.     Heart sounds: Normal heart sounds.  Pulmonary:     Effort: Pulmonary effort is normal.     Breath sounds: Normal breath sounds. No wheezing, rhonchi or rales.  Abdominal:     General: Abdomen is flat.     Palpations: Abdomen is soft.  Musculoskeletal:     Right lower leg: No edema.  Neurological:     Mental Status: He is alert.           Assessment & Plan:  Type 2 diabetes mellitus with diabetic peripheral angiopathy with gangrene, unspecified whether long term insulin use ([place]) - Plan: Hemoglobin A1c, CBC with Differential/Platelet, COMPLETE METABOLIC PANEL WITH GFR, Lipid panel  Essential hypertension  Chronic atrial fibrillation (HCC)  Hypertriglyceridemia  Type 2 diabetes mellitus with pressure callus (HCC)  My biggest concern is the callus forming on the fifth MTP joint.  Recommended a wound care consultation.  Previously this is led to amputation in this patient so I want to treat this aggressively.  Blood pressure today is excellent.  Heart rate is well controlled although he is in atrial fibrillation.  However he is appropriately anticoagulated.  Therefore we will check a CBC, CMP, lipid panel, and an A1c to ensure adequate control of his chronic medical conditions.  Consult wound care

## 2021-01-01 LAB — CBC WITH DIFFERENTIAL/PLATELET
Absolute Monocytes: 581 cells/uL (ref 200–950)
Basophils Absolute: 79 cells/uL (ref 0–200)
Basophils Relative: 0.9 %
Eosinophils Absolute: 317 cells/uL (ref 15–500)
Eosinophils Relative: 3.6 %
HCT: 35.3 % — ABNORMAL LOW (ref 38.5–50.0)
Hemoglobin: 11 g/dL — ABNORMAL LOW (ref 13.2–17.1)
Lymphs Abs: 2851 cells/uL (ref 850–3900)
MCH: 29.1 pg (ref 27.0–33.0)
MCHC: 31.2 g/dL — ABNORMAL LOW (ref 32.0–36.0)
MCV: 93.4 fL (ref 80.0–100.0)
MPV: 10.9 fL (ref 7.5–12.5)
Monocytes Relative: 6.6 %
Neutro Abs: 4972 cells/uL (ref 1500–7800)
Neutrophils Relative %: 56.5 %
Platelets: 166 10*3/uL (ref 140–400)
RBC: 3.78 10*6/uL — ABNORMAL LOW (ref 4.20–5.80)
RDW: 14.3 % (ref 11.0–15.0)
Total Lymphocyte: 32.4 %
WBC: 8.8 10*3/uL (ref 3.8–10.8)

## 2021-01-01 LAB — HEMOGLOBIN A1C
Hgb A1c MFr Bld: 5.7 % of total Hgb — ABNORMAL HIGH (ref <5.7)
Mean Plasma Glucose: 117 mg/dL
eAG (mmol/L): 6.5 mmol/L

## 2021-01-01 LAB — LIPID PANEL
Cholesterol: 84 mg/dL (ref <200)
HDL: 28 mg/dL — ABNORMAL LOW (ref >=40)
LDL Cholesterol (Calc): 35 mg/dL (calc)
Non-HDL Cholesterol (Calc): 56 mg/dL (calc) (ref <130)
Total CHOL/HDL Ratio: 3 (calc) (ref <5.0)
Triglycerides: 119 mg/dL (ref <150)

## 2021-01-01 LAB — COMPLETE METABOLIC PANEL WITH GFR
AG Ratio: 1.5 (calc) (ref 1.0–2.5)
ALT: 37 U/L (ref 9–46)
AST: 44 U/L — ABNORMAL HIGH (ref 10–35)
Albumin: 3.7 g/dL (ref 3.6–5.1)
Alkaline phosphatase (APISO): 60 U/L (ref 35–144)
BUN/Creatinine Ratio: 16 (calc) (ref 6–22)
BUN: 25 mg/dL (ref 7–25)
CO2: 26 mmol/L (ref 20–32)
Calcium: 8.9 mg/dL (ref 8.6–10.3)
Chloride: 105 mmol/L (ref 98–110)
Creat: 1.56 mg/dL — ABNORMAL HIGH (ref 0.70–1.11)
GFR, Est African American: 45 mL/min/{1.73_m2} — ABNORMAL LOW (ref >=60)
GFR, Est Non African American: 39 mL/min/{1.73_m2} — ABNORMAL LOW (ref >=60)
Globulin: 2.4 g/dL (calc) (ref 1.9–3.7)
Glucose, Bld: 160 mg/dL — ABNORMAL HIGH (ref 65–99)
Potassium: 4.6 mmol/L (ref 3.5–5.3)
Sodium: 136 mmol/L (ref 135–146)
Total Bilirubin: 0.5 mg/dL (ref 0.2–1.2)
Total Protein: 6.1 g/dL (ref 6.1–8.1)

## 2021-01-02 ENCOUNTER — Other Ambulatory Visit: Payer: Self-pay

## 2021-01-02 ENCOUNTER — Encounter: Payer: Self-pay | Admitting: *Deleted

## 2021-01-02 ENCOUNTER — Encounter: Payer: PPO | Attending: Internal Medicine | Admitting: Internal Medicine

## 2021-01-02 DIAGNOSIS — L84 Corns and callosities: Secondary | ICD-10-CM | POA: Diagnosis not present

## 2021-01-02 DIAGNOSIS — Z89512 Acquired absence of left leg below knee: Secondary | ICD-10-CM | POA: Diagnosis not present

## 2021-01-02 DIAGNOSIS — E1140 Type 2 diabetes mellitus with diabetic neuropathy, unspecified: Secondary | ICD-10-CM | POA: Insufficient documentation

## 2021-01-02 DIAGNOSIS — I10 Essential (primary) hypertension: Secondary | ICD-10-CM | POA: Insufficient documentation

## 2021-01-02 DIAGNOSIS — I2510 Atherosclerotic heart disease of native coronary artery without angina pectoris: Secondary | ICD-10-CM | POA: Diagnosis not present

## 2021-01-02 DIAGNOSIS — E1151 Type 2 diabetes mellitus with diabetic peripheral angiopathy without gangrene: Secondary | ICD-10-CM | POA: Insufficient documentation

## 2021-01-02 DIAGNOSIS — E11621 Type 2 diabetes mellitus with foot ulcer: Secondary | ICD-10-CM | POA: Insufficient documentation

## 2021-01-02 DIAGNOSIS — Z7901 Long term (current) use of anticoagulants: Secondary | ICD-10-CM | POA: Diagnosis not present

## 2021-01-02 DIAGNOSIS — L97519 Non-pressure chronic ulcer of other part of right foot with unspecified severity: Secondary | ICD-10-CM | POA: Diagnosis not present

## 2021-01-03 ENCOUNTER — Other Ambulatory Visit: Payer: Self-pay | Admitting: Family Medicine

## 2021-01-07 NOTE — Progress Notes (Signed)
YORDY, MATTON ([id number]) Visit Report for 01/02/2021 HPI Details Patient Name: Eric [name], Eric [name] Date of Service: 01/02/2021 8:30 AM Medical Record Number: [id number] Patient Account Number: [id number] Date of Birth/Sex: 1928-06-21 (87 y.o. M) Treating RN: Dolan Amen Primary Care Provider: Jenna Luo Other Clinician: Referring Provider: Jenna Luo Treating Provider/Extender: Tito Dine in Treatment: 0 History of Present Illness HPI Description: ADMISSION 01/02/2021 This is a 87 year old independent man who came in with his wife. He has a history of a difficult callused area over the right fifth MTP he describes recurrent callus over this area dating back years. He has a previous left BKA secondary to osteomyelitis he thinks in the left foot but he thinks the callus on the right fifth MTP predates that. He has diabetic shoes with custom inserts. He was sent here by his primary physician concerned about preulcerative callus on the plantar aspect of the fifth right MTP. Primary doctor has been debriding the callus which is appropriate. The patient does not describe an open wound. He has a history of PAD on the left however he had noninvasive studies on 11/13/2019 on the right which showed a ABI of 1.25 however his TBI was only 0.33 with monophasic waveforms Past medical history includes type 2 diabetes, PAD as noted above, coronary artery disease, atrial fib on Eliquis, history of squamous cell CA on his left arm, left BKA is noted perhaps with gangrene at that time, stage III chronic renal failure. ABI in our clinic was 0.86 on the right Electronic Signature(s) Signed: 01/03/2021 4:35:25 PM By: Linton Ham MD Entered By: Linton Ham on 01/02/2021 09:47:19 Khamis, Eric [name] ([id number]) -------------------------------------------------------------------------------- Callus Pairing Details Patient Name: Eric [name] Date of Service: 01/02/2021 8:30  AM Medical Record Number: [id number] Patient Account Number: [id number] Date of Birth/Sex: 1927/11/20 (87 y.o. M) Treating RN: Dolan Amen Primary Care Provider: Jenna Luo Other Clinician: Referring Provider: Jenna Luo Treating Provider/Extender: Tito Dine in Treatment: 0 Procedure Performed for: Non-Wound Location Performed By: Physician [name], MD Post Procedure Diagnosis Same as Pre-procedure Notes MD used #11 scalpel Electronic Signature(s) Signed: 01/02/2021 4:56:26 PM By: Georges Mouse, Minus Breeding RN Entered By: Georges Mouse, Minus Breeding on 01/02/2021 09:39:31 Perritt, Eric [name] ([id number]) -------------------------------------------------------------------------------- Physical Exam Details Patient Name: Eric [name] Date of Service: 01/02/2021 8:30 AM Medical Record Number: [id number] Patient Account Number: [id number] Date of Birth/Sex: 1927-08-27 (87 y.o. M) Treating RN: Dolan Amen Primary Care Provider: Jenna Luo Other Clinician: Referring Provider: Jenna Luo Treating Provider/Extender: Tito Dine in Treatment: 0 Constitutional Sitting or standing Blood Pressure is within target range for patient.. Pulse regular and within target range for patient.[name] Respirations regular, non- labored and within target range.. Temperature is normal and within the target range for the patient.[name] appears in no distress. Cardiovascular Pedal pulses are reduced but palpable. Neurological Completely failed the monofilament test on the right foot. Notes Wound exam; the patient does not have an open wound however he had raised hyperkeratotic tissue on the right fifth metatarsal head as described. I used a #11 scalpel to remove this underneath specifically there is no bleeding [not a plantar wart] and no open wound o I looked at his shoe. He has not inappropriately cut out an area on his fifth met head however this is much too large  and I think the met head is actually probably down on the bottom of the shoe as a result. He also has probably some degree of subluxation of the  fifth met head itself the whole thing complicated by the left BKA. Electronic Signature(s) Signed: 01/03/2021 4:35:25 PM By: Linton Ham MD Entered By: Linton Ham on 01/02/2021 09:49:17 Eric [name], Eric [name] ([id number]) -------------------------------------------------------------------------------- Physician Orders Details Patient Name: Eric [name] Date of Service: 01/02/2021 8:30 AM Medical Record Number: [id number] Patient Account Number: [id number] Date of Birth/Sex: 11/05/1927 (87 y.o. M) Treating RN: Dolan Amen Primary Care Provider: Jenna Luo Other Clinician: Referring Provider: Jenna Luo Treating Provider/Extender: Tito Dine in Treatment: 0 Verbal / Phone Orders: No Diagnosis Coding Discharge From [place] Only - Pt to follow up with podiatry if problem continues Electronic Signature(s) Signed: 01/02/2021 4:56:26 PM By: Georges Mouse, Minus Breeding RN Signed: 01/03/2021 4:35:25 PM By: Linton Ham MD Entered By: Georges Mouse, Minus Breeding on 01/02/2021 09:40:37 Eric [name] ([id number]) -------------------------------------------------------------------------------- Problem List Details Patient Name: Eric [name] Date of Service: 01/02/2021 8:30 AM Medical Record Number: [id number] Patient Account Number: [id number] Date of Birth/Sex: 06-20-1928 (87 y.o. M) Treating RN: Dolan Amen Primary Care Provider: Jenna Luo Other Clinician: Referring Provider: Jenna Luo Treating Provider/Extender: Tito Dine in Treatment: 0 Active Problems ICD-10 Encounter Code Description Active Date MDM Diagnosis E11.621 Type 2 diabetes mellitus with foot ulcer 01/02/2021 No Yes L84 Corns and callosities 01/02/2021 No Yes Inactive Problems Resolved Problems Electronic  Signature(s) Signed: 01/03/2021 4:35:25 PM By: Linton Ham MD Entered By: Linton Ham on 01/02/2021 09:43:19 Eric [name], Eric [name] ([id number]) -------------------------------------------------------------------------------- Progress Note Details Patient Name: Eric [name] Date of Service: 01/02/2021 8:30 AM Medical Record Number: [id number] Patient Account Number: [id number] Date of Birth/Sex: 12/01/1927 (87 y.o. M) Treating RN: Dolan Amen Primary Care Provider: Jenna Luo Other Clinician: Referring Provider: Jenna Luo Treating Provider/Extender: Tito Dine in Treatment: 0 Subjective History of Present Illness (HPI) ADMISSION 01/02/2021 This is a 87 year old independent man who came in with his wife. He has a history of a difficult callused area over the right fifth MTP he describes recurrent callus over this area dating back years. He has a previous left BKA secondary to osteomyelitis he thinks in the left foot but he thinks the callus on the right fifth MTP predates that. He has diabetic shoes with custom inserts. He was sent here by his primary physician concerned about preulcerative callus on the plantar aspect of the fifth right MTP. Primary doctor has been debriding the callus which is appropriate. The patient does not describe an open wound. He has a history of PAD on the left however he had noninvasive studies on 11/13/2019 on the right which showed a ABI of 1.25 however his TBI was only 0.33 with monophasic waveforms Past medical history includes type 2 diabetes, PAD as noted above, coronary artery disease, atrial fib on Eliquis, history of squamous cell CA on his left arm, left BKA is noted perhaps with gangrene at that time, stage III chronic renal failure. ABI in our clinic was 0.86 on the right Patient History Information obtained from Patient. Allergies isosorbide, ramipril, quinidine Social History Never smoker, Marital Status - Married,  Alcohol Use - Never, Drug Use - No History, Caffeine Use - Moderate. Medical History Eyes Denies history of Cataracts, Glaucoma, Optic Neuritis Ear/Nose/Mouth/Throat Denies history of Chronic sinus problems/congestion, Middle ear problems Hematologic/Lymphatic Denies history of Anemia, Hemophilia, Human Immunodeficiency Virus, Lymphedema, Sickle Cell Disease Respiratory Denies history of Aspiration, Asthma, Chronic Obstructive Pulmonary Disease (COPD), Pneumothorax, Sleep Apnea, Tuberculosis Cardiovascular Patient has history of Hypertension, Peripheral Venous Disease Denies history of Angina, Arrhythmia,  Congestive Heart Failure, Coronary Artery Disease, Deep Vein Thrombosis, Hypotension, Myocardial Infarction, Peripheral Arterial Disease, Phlebitis, Vasculitis Gastrointestinal Denies history of Colitis, Crohn s, Hepatitis A, Hepatitis B, Hepatitis C Endocrine Patient has history of Type II Diabetes Denies history of Type I Diabetes Genitourinary Denies history of End Stage Renal Disease Immunological Denies history of Lupus Erythematosus, Raynaud s, Scleroderma Integumentary (Skin) Denies history of History of Burn, History of pressure wounds Musculoskeletal Denies history of Gout, Rheumatoid Arthritis, Osteoarthritis, Osteomyelitis Neurologic Patient has history of Neuropathy Denies history of Dementia, Quadriplegia, Paraplegia, Seizure Disorder Oncologic Denies history of Received Chemotherapy, Received Radiation Psychiatric Denies history of Anorexia/bulimia, Confinement Anxiety Patient is treated with Controlled Diet. Blood sugar is not tested. Review of Systems (ROS) Constitutional Symptoms (General Health) Denies complaints or symptoms of Fatigue, Fever, Chills, Marked Weight Change. Eric [name], Eric [name] ([id number]) Eyes Denies complaints or symptoms of Dry Eyes, Vision Changes, Glasses / Contacts. Ear/Nose/Mouth/Throat Denies complaints or symptoms of Difficult  clearing ears, Sinusitis. Hematologic/Lymphatic Denies complaints or symptoms of Bleeding / Clotting Disorders, Human Immunodeficiency Virus. Respiratory Denies complaints or symptoms of Chronic or frequent coughs, Shortness of Breath. Cardiovascular Denies complaints or symptoms of Chest pain, LE edema. Gastrointestinal Denies complaints or symptoms of Frequent diarrhea, Nausea, Vomiting. Endocrine Denies complaints or symptoms of Hepatitis, Thyroid disease, Polydypsia (Excessive Thirst). Genitourinary Denies complaints or symptoms of Kidney failure/ Dialysis, Incontinence/dribbling. Immunological Denies complaints or symptoms of Hives, Itching. Integumentary (Skin) Complains or has symptoms of Wounds. Denies complaints or symptoms of Bleeding or bruising tendency, Breakdown, Swelling. Musculoskeletal Denies complaints or symptoms of Muscle Pain, Muscle Weakness. Neurologic Denies complaints or symptoms of Numbness/parasthesias, Focal/Weakness. Psychiatric Denies complaints or symptoms of Anxiety, Claustrophobia. Objective Constitutional Sitting or standing Blood Pressure is within target range for patient.. Pulse regular and within target range for patient.[name] Respirations regular, non- labored and within target range.. Temperature is normal and within the target range for the patient.[name] appears in no distress. Vitals Time Taken: 8:56 AM, Height: 65 in, Source: Stated, Weight: 170 lbs, Source: Stated, BMI: 28.3, Temperature: 97.9 F, Pulse: 58 bpm, Respiratory Rate: 18 breaths/min, Blood Pressure: 129/60 mmHg. Cardiovascular Pedal pulses are reduced but palpable. Neurological Completely failed the monofilament test on the right foot. General Notes: Wound exam; the patient does not have an open wound however he had raised hyperkeratotic tissue on the right fifth metatarsal head as described. I used a #11 scalpel to remove this underneath specifically there is no bleeding [not a  plantar wart] and no open wound I looked at his shoe. He has not inappropriately cut out an area on his fifth met head however this is much too large and I think the met head is actually probably down on the bottom of the shoe as a result. He also has probably some degree of subluxation of the fifth met head itself the whole thing complicated by the left BKA. Assessment Active Problems ICD-10 Type 2 diabetes mellitus with foot ulcer Corns and callosities Eric [name], Eric [name]. ([id number]) Procedures A Callus Pairing procedure was performed. by [name], MD. Post procedure Diagnosis Wound #: Same as Pre-Procedure Notes: MD used #11 scalpel Plan Discharge From [facility] Services: Consult Only - Pt to follow up with podiatry if problem continues 1. I think this is callus caused by chronic pressure and friction on this area of many years duration according to the patient. He thinks this even was occurring before his BKA on the left. I have asked him to use a callus pad  on this area he is going to need to replace the insole in his shoe. Cutting a hole in this area is not inappropriate however is going to have to be done a little more carefully than what he is done and I think the area that he is concerned about is actually down on the sole of his shoe as a result 2. He does not have a wound or a history of wounds at this point I do not think he needs to be followed in this clinic. HOWEVER he does have peripheral neuropathy and potentially a subluxed fifth metatarsal head. He is certainly at risk for a wound in this area 3. If none of the offloading including a callus pad replacing his insole works then he may need to see podiatry. I am doubtful that there would be any surgical procedure to help with the subluxation. 4. He does have PAD although no symptoms and no wound history. Based on his ABIs of last year this probably should be followed especially if he develops an open wound 5. There is no  need for him to be followed here unless he develops an open wound. I explained to him that he is at some risk of this because of a number of factors including the left prosthesis pushing them on the lateral part of his foot. However podiatry would be a good option for follow-up otherwise I spent 30 minutes in review of this patient's past medical history, face-to-face evaluation and preparation of this record Electronic Signature(s) Signed: 01/03/2021 4:35:25 PM By: Linton Ham MD Entered By: Linton Ham on 01/02/2021 09:52:32 Eric [name] ([id number]) -------------------------------------------------------------------------------- ROS/PFSH Details Patient Name: Eric [name] Date of Service: 01/02/2021 8:30 AM Medical Record Number: [id number] Patient Account Number: [id number] Date of Birth/Sex: 05-Nov-1927 (87 y.o. M) Treating RN: Carlene Coria Primary Care Provider: Jenna Luo Other Clinician: Referring Provider: Jenna Luo Treating Provider/Extender: Tito Dine in Treatment: 0 Information Obtained From Patient Constitutional Symptoms (General Health) Complaints and Symptoms: Negative for: Fatigue; Fever; Chills; Marked Weight Change Eyes Complaints and Symptoms: Negative for: Dry Eyes; Vision Changes; Glasses / Contacts Medical History: Negative for: Cataracts; Glaucoma; Optic Neuritis Ear/Nose/Mouth/Throat Complaints and Symptoms: Negative for: Difficult clearing ears; Sinusitis Medical History: Negative for: Chronic sinus problems/congestion; Middle ear problems Hematologic/Lymphatic Complaints and Symptoms: Negative for: Bleeding / Clotting Disorders; Human Immunodeficiency Virus Medical History: Negative for: Anemia; Hemophilia; Human Immunodeficiency Virus; Lymphedema; Sickle Cell Disease Respiratory Complaints and Symptoms: Negative for: Chronic or frequent coughs; Shortness of Breath Medical History: Negative for: Aspiration;  Asthma; Chronic Obstructive Pulmonary Disease (COPD); Pneumothorax; Sleep Apnea; Tuberculosis Cardiovascular Complaints and Symptoms: Negative for: Chest pain; LE edema Medical History: Positive for: Hypertension; Peripheral Venous Disease Negative for: Angina; Arrhythmia; Congestive Heart Failure; Coronary Artery Disease; Deep Vein Thrombosis; Hypotension; Myocardial Infarction; Peripheral Arterial Disease; Phlebitis; Vasculitis Gastrointestinal Complaints and Symptoms: Negative for: Frequent diarrhea; Nausea; Vomiting Medical History: Negative for: Colitis; Crohnos; Hepatitis A; Hepatitis B; Hepatitis C Endocrine Eric [name], Eric [name]. ([id number]) Complaints and Symptoms: Negative for: Hepatitis; Thyroid disease; Polydypsia (Excessive Thirst) Medical History: Positive for: Type II Diabetes Negative for: Type I Diabetes Time with diabetes: 5 Treated with: Diet Blood sugar tested every day: No Genitourinary Complaints and Symptoms: Negative for: Kidney failure/ Dialysis; Incontinence/dribbling Medical History: Negative for: End Stage Renal Disease Immunological Complaints and Symptoms: Negative for: Hives; Itching Medical History: Negative for: Lupus Erythematosus; Raynaudos; Scleroderma Integumentary (Skin) Complaints and Symptoms: Positive for: Wounds Negative for: Bleeding or bruising tendency; Breakdown; Swelling  Medical History: Negative for: History of Burn; History of pressure wounds Musculoskeletal Complaints and Symptoms: Negative for: Muscle Pain; Muscle Weakness Medical History: Negative for: Gout; Rheumatoid Arthritis; Osteoarthritis; Osteomyelitis Neurologic Complaints and Symptoms: Negative for: Numbness/parasthesias; Focal/Weakness Medical History: Positive for: Neuropathy Negative for: Dementia; Quadriplegia; Paraplegia; Seizure Disorder Psychiatric Complaints and Symptoms: Negative for: Anxiety; Claustrophobia Medical History: Negative for:  Anorexia/bulimia; Confinement Anxiety Oncologic Medical History: Negative for: Received Chemotherapy; Received Radiation Immunizations Pneumococcal Vaccine: Received Pneumococcal Vaccination: Yes Eric [name], Eric [name] ([id number]) Implantable Devices None Family and Social History Never smoker; Marital Status - Married; Alcohol Use: Never; Drug Use: No History; Caffeine Use: Moderate; Financial Concerns: No; Food, Clothing or Shelter Needs: No; Support System Lacking: No; Transportation Concerns: No Electronic Signature(s) Signed: 01/03/2021 4:35:25 PM By: Linton Ham MD Signed: 01/07/2021 3:50:09 PM By: Carlene Coria RN Entered By: Carlene Coria on 01/02/2021 09:02:02 Eric [name], Eric [name] ([id number]) -------------------------------------------------------------------------------- [place] Details Patient Name: Eric [name] Date of Service: 01/02/2021 Medical Record Number: [id number] Patient Account Number: [id number] Date of Birth/Sex: 11/11/1927 (87 y.o. M) Treating RN: Dolan Amen Primary Care Provider: Jenna Luo Other Clinician: Referring Provider: Jenna Luo Treating Provider/Extender: Tito Dine in Treatment: 0 Diagnosis Coding ICD-10 Codes Code Description [phone] Type 2 diabetes mellitus with foot ulcer L84 Corns and callosities Facility Procedures CPT4 Code: 70110034 Description: 96116 - WOUND CARE VISIT-LEV 3 EST PT Modifier: Quantity: 1 CPT4 Code: 43539122 Description: 58346 - PARE BENIGN LES; SGL Modifier: Quantity: 1 CPT4 Code: Description: ICD-10 Diagnosis Description E11.621 Type 2 diabetes mellitus with foot ulcer Modifier: Quantity: Physician Procedures CPT4 Code: 2194712 Description: WC PHYS LEVEL 3 o NEW PT Modifier: 25 Quantity: 1 CPT4 Code: Description: ICD-10 Diagnosis Description E11.621 Type 2 diabetes mellitus with foot ulcer L84 Corns and callosities Modifier: Quantity: CPT4 Code: 5271292 Description: 90903 - WC  PHYS PARE BENIGN LES; SGL Modifier: Quantity: 1 CPT4 Code: Description: ICD-10 Diagnosis Description E11.621 Type 2 diabetes mellitus with foot ulcer Modifier: Quantity: Electronic Signature(s) Signed: 01/03/2021 4:35:25 PM By: Linton Ham MD Entered By: Linton Ham on 01/02/2021 09:52:58

## 2021-01-07 NOTE — Progress Notes (Signed)
HEATHER, STREEPER ([id number]) Visit Report for 01/02/2021 Abuse/Suicide Risk Screen Details Patient Name: Eric [name], Eric [name] Date of Service: 01/02/2021 8:30 AM Medical Record Number: [id number] Patient Account Number: [id number] Date of Birth/Sex: 03-Jun-1928 (87 y.o. M) Treating RN: Carlene Coria Primary Care Onisha Cedeno: Jenna Luo Other Clinician: Referring Trequan Marsolek: Jenna Luo Treating Olivene Cookston/Extender: Tito Dine in Treatment: 0 Abuse/Suicide Risk Screen Items Answer ABUSE RISK SCREEN: Has anyone close to you tried to hurt or harm you recentlyo No Do you feel uncomfortable with anyone in your familyo No Has anyone forced you do things that you didnot want to doo No Electronic Signature(s) Signed: 01/07/2021 3:50:09 PM By: Carlene Coria RN Entered By: Carlene Coria on 01/02/2021 09:02:15 Eric [name] ([id number]) -------------------------------------------------------------------------------- Activities of Daily Living Details Patient Name: Eric [name] Date of Service: 01/02/2021 8:30 AM Medical Record Number: [id number] Patient Account Number: [id number] Date of Birth/Sex: [birth year] (87 y.o. M) Treating RN: Carlene Coria Primary Care Kely Dohn: Jenna Luo Other Clinician: Referring Isidora Laham: Jenna Luo Treating Deundre Thong/Extender: Tito Dine in Treatment: 0 Activities of Daily Living Items Answer Activities of Daily Living (Please select one for each item) Drive Automobile Completely Able Take Medications Completely Able Use Telephone Completely Able Care for Appearance Completely Able Use Toilet Completely Able Bath / Shower Completely Able Dress Self Completely Able Feed Self Completely Able Walk Completely Able Get In / Out Bed Completely Able Housework Completely Able Prepare Meals Completely [place] for Self Completely Able Electronic Signature(s) Signed: 01/07/2021 3:50:09 PM By: Carlene Coria RN Entered By: Carlene Coria on 01/02/2021 09:03:08 Eric [name] ([id number]) -------------------------------------------------------------------------------- Education Screening Details Patient Name: Eric [name] Date of Service: 01/02/2021 8:30 AM Medical Record Number: [id number] Patient Account Number: [id number] Date of Birth/Sex: 1927/11/03 (87 y.o. M) Treating RN: Carlene Coria Primary Care Kenshin Splawn: Jenna Luo Other Clinician: Referring Sande Pickert: Jenna Luo Treating Larkyn Greenberger/Extender: Tito Dine in Treatment: 0 Primary Learner Assessed: Patient Learning Preferences/Education Level/Primary Language Learning Preference: Explanation Highest Education Level: Grade School Preferred Language: English Cognitive Barrier Language Barrier: No Translator Needed: No Memory Deficit: No Emotional Barrier: No Cultural/Religious Beliefs Affecting Medical Care: No Physical Barrier Impaired Vision: No Impaired Hearing: Yes Hearing Aid Decreased Hand dexterity: No Knowledge/Comprehension Knowledge Level: Medium Comprehension Level: Medium Ability to understand written instructions: Low Ability to understand verbal instructions: High Motivation Anxiety Level: Anxious Cooperation: Cooperative Education Importance: Acknowledges Need Interest in Health Problems: Asks Questions Perception: Coherent Willingness to Engage in Self-Management High Activities: Readiness to Engage in Self-Management High Activities: Electronic Signature(s) Signed: 01/07/2021 3:50:09 PM By: Carlene Coria RN Entered By: Carlene Coria on 01/02/2021 09:03:52 JALEEL, ALLEN ([id number]) -------------------------------------------------------------------------------- Fall Risk Assessment Details Patient Name: Eric [name] Date of Service: 01/02/2021 8:30 AM Medical Record Number: [id number] Patient Account Number: [id number] Date of Birth/Sex: 1928-02-18 (87 y.o.  M) Treating RN: Carlene Coria Primary Care Alianys Chacko: Jenna Luo Other Clinician: Referring Luellen Howson: Jenna Luo Treating Artesha Wemhoff/Extender: Tito Dine in Treatment: 0 Fall Risk Assessment Items Have you had 2 or more falls in the last 12 monthso 0 No Have you had any fall that resulted in injury in the last 12 monthso 0 No FALLS RISK SCREEN History of falling - immediate or within 3 months 0 No Secondary diagnosis (Do you have 2 or more medical diagnoseso) 0 No Ambulatory aid None/bed rest/wheelchair/nurse 0 No Crutches/cane/walker 0 No Furniture 0 No Intravenous therapy Access/Saline/Heparin Lock 0 No Gait/Transferring Normal/ bed rest/ wheelchair 0  No Weak (short steps with or without shuffle, stooped but able to lift head while walking, may 0 No seek support from furniture) Impaired (short steps with shuffle, may have difficulty arising from chair, head down, impaired 0 No balance) Mental Status Oriented to own ability 0 No Electronic Signature(s) Signed: 01/07/2021 3:50:09 PM By: Carlene Coria RN Entered By: Carlene Coria on 01/02/2021 09:04:33 Eric [name] ([id number]) -------------------------------------------------------------------------------- Foot Assessment Details Patient Name: Eric [name] Date of Service: 01/02/2021 8:30 AM Medical Record Number: [id number] Patient Account Number: [id number] Date of Birth/Sex: 12-31-1927 (87 y.o. M) Treating RN: Carlene Coria Primary Care Willye Javier: Jenna Luo Other Clinician: Referring Geralene Afshar: Jenna Luo Treating Danyka Merlin/Extender: Tito Dine in Treatment: 0 Foot Assessment Items Site Locations + = Sensation present, - = Sensation absent, C = Callus, U = Ulcer R = Redness, W = Warmth, M = Maceration, PU = Pre-ulcerative lesion F = Fissure, S = Swelling, D = Dryness Assessment Right: Left: Other Deformity: No No Prior Foot Ulcer: No Yes Prior Amputation: No Yes Charcot  Joint: No No Ambulatory Status: Ambulatory With Help Assistance Device: Cane Gait: Steady Electronic Signature(s) Signed: 01/07/2021 3:50:09 PM By: Carlene Coria RN Entered By: Carlene Coria on 01/02/2021 09:11:26 Eric [name] ([id number]) -------------------------------------------------------------------------------- Nutrition Risk Screening Details Patient Name: Eric [name] Date of Service: 01/02/2021 8:30 AM Medical Record Number: [id number] Patient Account Number: [id number] Date of Birth/Sex: [birth year] (87 y.o. M) Treating RN: Carlene Coria Primary Care Marche Hottenstein: Jenna Luo Other Clinician: Referring Oliana Gowens: Jenna Luo Treating Bradely Rudin/Extender: Tito Dine in Treatment: 0 Height (in): 65 Weight (lbs): 170 Body Mass Index (BMI): 28.3 Nutrition Risk Screening Items Score Screening NUTRITION RISK SCREEN: I have an illness or condition that made me change the kind and/or amount of food I eat 0 No I eat fewer than two meals per day 0 No I eat few fruits and vegetables, or milk products 0 No I have three or more drinks of beer, liquor or wine almost every day 0 No I have tooth or mouth problems that make it hard for me to eat 0 No I don't always have enough money to buy the food I need 0 No I eat alone most of the time 0 No I take three or more different prescribed or over-the-counter drugs a day 1 Yes Without wanting to, I have lost or gained 10 pounds in the last six months 0 No I am not always physically able to shop, cook and/or feed myself 2 Yes Nutrition Protocols Good Risk Protocol Moderate Risk Protocol 0 Provide education on nutrition High Risk Proctocol Risk Level: Moderate Risk Score: 3 Electronic Signature(s) Signed: 01/07/2021 3:50:09 PM By: Carlene Coria RN Entered By: Carlene Coria on 01/02/2021 09:05:05

## 2021-01-07 NOTE — Progress Notes (Signed)
CAIDEN, ARTEAGA ([id number]) Visit Report for 01/02/2021 Allergy List Details Patient Name: Eric [name], Eric [name] Date of Service: 01/02/2021 8:30 AM Medical Record Number: [id number] Patient Account Number: [id number] Date of Birth/Sex: 04-Nov-1927 (87 y.o. M) Treating RN: Carlene Coria Primary Care Neelie Welshans: Jenna Luo Other Clinician: Referring Lawernce Earll: Jenna Luo Treating Ziyan Hillmer/Extender: [name] Weeks in Treatment: 0 Allergies Active Allergies isosorbide ramipril quinidine Allergy Notes Electronic Signature(s) Signed: 01/07/2021 3:50:09 PM By: Carlene Coria RN Entered By: Carlene Coria on 01/02/2021 08:58:34 Eric [name] ([id number]) -------------------------------------------------------------------------------- Arrival Information Details Patient Name: Eric [name] Date of Service: 01/02/2021 8:30 AM Medical Record Number: [id number] Patient Account Number: [id number] Date of Birth/Sex: 05-Jan-1928 (87 y.o. M) Treating RN: Carlene Coria Primary Care Jakiah Goree: Jenna Luo Other Clinician: Referring Karilynn Carranza: Jenna Luo Treating Brenna Friesenhahn/Extender: Tito Dine in Treatment: 0 Visit Information Patient Arrived: Eric [name] Time: 08:54 Accompanied By: wife Transfer Assistance: None Patient Identification Verified: Yes Secondary Verification Process Completed: Yes Patient Requires Transmission-Based No Precautions: Patient Has Alerts: Yes Patient Alerts: Patient on Blood Thinner Electronic Signature(s) Signed: 01/07/2021 3:50:09 PM By: Carlene Coria RN Entered By: Carlene Coria on 01/02/2021 08:55:12 Eric [name], Eric [name] ([id number]) -------------------------------------------------------------------------------- Clinic Level of Care Assessment Details Patient Name: Eric [name] Date of Service: 01/02/2021 8:30 AM Medical Record Number: [id number] Patient Account Number: [id number] Date of Birth/Sex: [birth year] (87 y.o.  M) Treating RN: Dolan Amen Primary Care Sarahy Creedon: Jenna Luo Other Clinician: Referring Ercel Pepitone: Jenna Luo Treating Mamadou Breon/Extender: Tito Dine in Treatment: 0 Clinic Level of Care Assessment Items TOOL 1 Quantity Score X - Use when EandM and Procedure is performed on INITIAL visit 1 0 ASSESSMENTS - Nursing Assessment / Reassessment X - General Physical Exam (combine w/ comprehensive assessment (listed just below) when performed on new 1 20 pt. evals) X- 1 25 Comprehensive Assessment (HX, ROS, Risk Assessments, Wounds Hx, etc.) ASSESSMENTS - Wound and Skin Assessment / Reassessment []  - Dermatologic / Skin Assessment (not related to wound area) 0 ASSESSMENTS - Ostomy and/or Continence Assessment and Care []  - Incontinence Assessment and Management 0 []  - 0 Ostomy Care Assessment and Management (repouching, etc.) PROCESS - Coordination of Care X - Simple Patient / Family Education for ongoing care 1 15 []  - 0 Complex (extensive) Patient / Family Education for ongoing care X- 1 10 Staff obtains Programmer, systems, Records, Test Results / Process Orders []  - 0 Staff telephones HHA, Nursing Homes / Clarify orders / etc []  - 0 Routine Transfer to another Facility (non-emergent condition) []  - 0 Routine Hospital Admission (non-emergent condition) []  - 0 New Admissions / Biomedical engineer / Ordering NPWT, Apligraf, etc. []  - 0 Emergency Hospital Admission (emergent condition) PROCESS - Special Needs []  - Pediatric / Minor Patient Management 0 []  - 0 Isolation Patient Management []  - 0 Hearing / Language / Visual special needs []  - 0 Assessment of Community assistance (transportation, D/C planning, etc.) []  - 0 Additional assistance / Altered mentation []  - 0 Support Surface(s) Assessment (bed, cushion, seat, etc.) INTERVENTIONS - Miscellaneous []  - External ear exam 0 []  - 0 Patient Transfer (multiple staff / Civil Service fast streamer / Similar devices) []  -  0 Simple Staple / Suture removal (25 or less) []  - 0 Complex Staple / Suture removal (26 or more) []  - 0 Hypo/Hyperglycemic Management (do not check if billed separately) X- 1 15 Ankle / Brachial Index (ABI) - do not check if billed separately Has the patient been seen at the hospital within the last  three years: Yes Total Score: 85 Level Of Care: New/Established - Level [address] BOSCO, PAPARELLA ([id number]) Electronic Signature(s) Signed: 01/02/2021 4:56:26 PM By: Georges Mouse, Minus Breeding RN Entered By: Georges Mouse, Minus Breeding on 01/02/2021 09:44:09 Eric [name] ([id number]) -------------------------------------------------------------------------------- Encounter Discharge Information Details Patient Name: Eric [name] Date of Service: 01/02/2021 8:30 AM Medical Record Number: [id number] Patient Account Number: [id number] Date of Birth/Sex: 10/04/1927 (87 y.o. M) Treating RN: Dolan Amen Primary Care Dequavion Follette: Jenna Luo Other Clinician: Referring Esker Dever: Jenna Luo Treating Gionni Vaca/Extender: Tito Dine in Treatment: 0 Encounter Discharge Information Items Discharge Condition: Stable Ambulatory Status: Cane Discharge Destination: Home Transportation: Private Auto Accompanied By: wife Schedule Follow-up Appointment: No Clinical Summary of Care: Electronic Signature(s) Signed: 01/02/2021 4:56:26 PM By: Georges Mouse, Minus Breeding RN Entered By: Georges Mouse, Minus Breeding on 01/02/2021 09:45:23 Eric [name] ([id number]) -------------------------------------------------------------------------------- Lower Extremity Assessment Details Patient Name: Eric [name] Date of Service: 01/02/2021 8:30 AM Medical Record Number: [id number] Patient Account Number: [id number] Date of Birth/Sex: June 24, 1928 (87 y.o. M) Treating RN: Carlene Coria Primary Care Jobani Sabado: Jenna Luo Other Clinician: Referring Jud Fanguy: Jenna Luo Treating Kostas Marrow/Extender:  Tito Dine in Treatment: 0 Edema Assessment Assessed: [Left: No] [Right: Yes] [Left: Edema] [Right: :] Calf Left: Right: Point of Measurement: 34 cm From Medial Instep 37 cm Ankle Left: Right: Point of Measurement: 10 cm From Medial Instep 24 cm Knee To Floor Left: Right: From Medial Instep 42 cm Vascular Assessment Pulses: Dorsalis Pedis Palpable: [Right:Yes] Blood Pressure: Brachial: [Right:129] Ankle: [Right:Dorsalis Pedis: 111 0.86] Electronic Signature(s) Signed: 01/07/2021 3:50:09 PM By: Carlene Coria RN Entered By: Carlene Coria on 01/02/2021 09:20:05 Eric [name] ([id number]) -------------------------------------------------------------------------------- Multi Wound Chart Details Patient Name: Eric [name] Date of Service: 01/02/2021 8:30 AM Medical Record Number: [id number] Patient Account Number: [id number] Date of Birth/Sex: 1928-05-03 (87 y.o. M) Treating RN: Dolan Amen Primary Care Symeon Puleo: Jenna Luo Other Clinician: Referring Jaiden Dinkins: Jenna Luo Treating Mendi Constable/Extender: Tito Dine in Treatment: 0 Vital Signs Height(in): 65 Pulse(bpm): 58 Weight(lbs): 170 Blood Pressure(mmHg): 129/60 Body Mass Index(BMI): 28 Temperature(F): 97.9 Respiratory Rate(breaths/min): 18 Wound Assessments Treatment Notes Electronic Signature(s) Signed: 01/03/2021 4:35:25 PM By: Linton Ham MD Entered By: Linton Ham on 01/02/2021 09:43:38 Eric [name], Eric [name] ([id number]) -------------------------------------------------------------------------------- [place] Details Patient Name: Eric [name] Date of Service: 01/02/2021 8:30 AM Medical Record Number: [id number] Patient Account Number: [id number] Date of Birth/Sex: Feb 06, 1928 (87 y.o. M) Treating RN: Dolan Amen Primary Care Charda Janis: Jenna Luo Other Clinician: Referring Edythe Riches: Jenna Luo Treating Mycheal Veldhuizen/Extender: Tito Dine in Treatment: 0 Active Inactive Electronic Signature(s) Signed: 01/02/2021 4:56:26 PM By: Georges Mouse, Minus Breeding RN Entered By: Georges Mouse, Minus Breeding on 01/02/2021 09:37:29 Eric [name] ([id number]) -------------------------------------------------------------------------------- Pain Assessment Details Patient Name: Eric [name] Date of Service: 01/02/2021 8:30 AM Medical Record Number: [id number] Patient Account Number: [id number] Date of Birth/Sex: [birth year] (87 y.o. M) Treating RN: Carlene Coria Primary Care Jecenia Leamer: Jenna Luo Other Clinician: Referring Marcus Schwandt: Jenna Luo Treating Makella Buckingham/Extender: Tito Dine in Treatment: 0 Active Problems Location of Pain Severity and Description of Pain Patient Has Paino No Site Locations Pain Management and Medication Current Pain Management: Electronic Signature(s) Signed: 01/07/2021 3:50:09 PM By: Carlene Coria RN Entered By: Carlene Coria on 01/02/2021 08:56:01 Eric [name], Eric [name] ([id number]) -------------------------------------------------------------------------------- Patient/Caregiver Education Details Patient Name: Eric [name] Date of Service: 01/02/2021 8:30 AM Medical Record Number: [id number] Patient Account Number: [id number] Date of Birth/Gender: 02-14-1928 (87 y.o. M) Treating RN: Dolan Amen Primary Care  Physician: Jenna Luo Other Clinician: Referring Physician: Jenna Luo Treating Physician/Extender: Tito Dine in Treatment: 0 Education Assessment Education Provided To: Patient Education Topics Provided Offloading: Methods: Explain/Verbal Responses: State content correctly Electronic Signature(s) Signed: 01/02/2021 4:56:26 PM By: Georges Mouse, Minus Breeding RN Entered By: Georges Mouse, Minus Breeding on 01/02/2021 09:44:53 Eric [name], Eric [name] ([id number]) -------------------------------------------------------------------------------- Vitals  Details Patient Name: Eric [name] Date of Service: 01/02/2021 8:30 AM Medical Record Number: [id number] Patient Account Number: [id number] Date of Birth/Sex: 1928-06-12 (87 y.o. M) Treating RN: Carlene Coria Primary Care Lynnleigh Soden: Jenna Luo Other Clinician: Referring Aireal Slater: Jenna Luo Treating Muneeb Veras/Extender: Tito Dine in Treatment: 0 Vital Signs Time Taken: 08:56 Temperature (F): 97.9 Height (in): 65 Pulse (bpm): 58 Source: Stated Respiratory Rate (breaths/min): 18 Weight (lbs): 170 Blood Pressure (mmHg): 129/60 Source: Stated Reference Range: 80 - 120 mg / dl Body Mass Index (BMI): 28.3 Electronic Signature(s) Signed: 01/07/2021 3:50:09 PM By: Carlene Coria RN Entered By: Carlene Coria on 01/02/2021 08:57:00

## 2021-01-09 ENCOUNTER — Telehealth: Payer: Self-pay | Admitting: *Deleted

## 2021-01-09 DIAGNOSIS — R609 Edema, unspecified: Secondary | ICD-10-CM

## 2021-01-09 MED ORDER — FUROSEMIDE 40 MG PO TABS: ORAL_TABLET | ORAL | 3 refills | Status: DC

## 2021-01-09 NOTE — Telephone Encounter (Signed)
-----   Message from Fay Records, MD sent at 01/01/2021 10:45 PM EDT ----- Thyroid function is normal Kidney function is decreased from December 2021 but similar to [place] Fluid remains elevated  Would recomm: Watch salt intake   Limit to 2.5 g per day Can try 1.5 lasix 2x per week   (every 3rd day)     F/U BMET and BNP in 3  wks

## 2021-01-09 NOTE — Telephone Encounter (Signed)
Patient has been informed of results and recommendations.  Appointment scheduled for return labs.  He was taking lasix 40 mg twice weekly.  He will increase to 60 mg twice weekly.   Discussed limiting sodium to 2000-2500 mg daily and reading nutrition labels.

## 2021-01-17 ENCOUNTER — Other Ambulatory Visit: Payer: Self-pay | Admitting: Family Medicine

## 2021-01-17 ENCOUNTER — Telehealth: Payer: Self-pay | Admitting: Pharmacist

## 2021-01-17 NOTE — Progress Notes (Addendum)
    Chronic Care Management Pharmacy Assistant   Name: Eric [name].  MRN: [id number] DOB: 08/19/1927  Reason for Encounter: Adherence Review   Conditions to be addressed/monitored: HTN, CAD, Type 2 DM.  Recent office visits:  None since 12/31/20  Recent consult visits:  01/09/21 Cardiology (Telephone) Per note:He was taking lasix 40 mg twice weekly.  He will increase to 60 mg twice weekly.   Discussed limiting sodium to 2000-2500 mg daily and reading nutrition labels 01/02/21 Wound Care [name], MD. No medication changes.   Hospital visits:  None since 12/31/20  Medications: Outpatient Encounter Medications as of 01/17/2021  Medication Sig   acetaminophen (TYLENOL) 325 MG tablet Take 2 tablets (650 mg total) by mouth every 6 (six) hours as needed for mild pain (or temp >/= 101 F).   amLODipine (NORVASC) 10 MG tablet Take 1 tablet (10 mg total) by mouth daily.   atenolol (TENORMIN) 25 MG tablet Take 0.5 tablets (12.5 mg total) by mouth daily.   B Complex Vitamins (B COMPLEX-B12) TABS Take 1 tablet by mouth daily.    cholecalciferol (VITAMIN D) 1000 UNITS tablet Take 1,000 Units by mouth 2 (two) times a week. Takes on Mon and Fri   Cinnamon 500 MG capsule Take 1,000 mg by mouth 2 (two) times daily.   ELIQUIS 5 MG TABS tablet Take 1 tablet by mouth twice daily   ferrous sulfate 325 (65 FE) MG EC tablet Take 325 mg by mouth 3 (three) times daily with meals.   finasteride (PROSCAR) 5 MG tablet TAKE 1 TABLET BY MOUTH ONCE DAILY   fish oil-omega-3 fatty acids 1000 MG capsule Take 1,000 mg by mouth daily.   furosemide (LASIX) 40 MG tablet Take 1.5 tabs by mouth every Mon and Thursday   hydrALAZINE (APRESOLINE) 25 MG tablet TAKE 3 TABLETS BY MOUTH EVERY 8 HOURS (NEEDS  OFFICE  VISIT  BEFORE  ANY  FURTHER  REFILLS)   lisinopril (ZESTRIL) 40 MG tablet Take 1 tablet by mouth once daily   Multiple Vitamin (DAILY MULTIVITAMIN PO) Take 1 tablet by mouth daily.   ONETOUCH ULTRA test  strip USE 1 STRIP TO CHECK GLUCOSE ONCE DAILY   pentoxifylline (TRENTAL) 400 MG CR tablet Take 1 tablet (400 mg total) by mouth every 12 (twelve) hours.   rosuvastatin (CRESTOR) 40 MG tablet TAKE 1 TABLET BY MOUTH ONCE DAILY (CALL  CLINIC  TO  SCHEDULE  AN  APPOINTMENT  FOR  FUTURE  REFILLS)   saccharomyces boulardii (FLORASTOR) 250 MG capsule Take 1 capsule (250 mg total) by mouth daily.   tamsulosin (FLOMAX) 0.4 MG CAPS capsule Take 1 capsule by mouth once daily   No facility-administered encounter medications on file as of 01/17/2021.    Care Gaps: Patient stated he gets out and walks a little bit, He stated he works out in the shop sometimes. Patient stated he drinks a lot of water during the day. He stated he has missed medications but it doesn't happen often enough to be behind on his refills. He stated he eats about 2 meals a day and snacks late in evening. We discussed getting assistance with his Eliquis, and I explained the process and he voiced understanding.   Star Rating Drugs: Lisinopril 40 mg 90 DS 11/15/20, Rosuvastatin 40 mg 90 DS 10/25/20.   Follow-Up:Pharmacist Review   Charlann Lange, [place] Pharmacist Assistant [phone]

## 2021-01-21 ENCOUNTER — Telehealth: Payer: Self-pay | Admitting: Pharmacist

## 2021-01-21 NOTE — Progress Notes (Addendum)
    Chronic Care Management Pharmacy Assistant   Name: Mutasim Tuckey.  MRN: [id number] DOB: 13-Jul-1928  Reason for Encounter: PAP  Medications: Outpatient Encounter Medications as of 01/21/2021  Medication Sig   acetaminophen (TYLENOL) 325 MG tablet Take 2 tablets (650 mg total) by mouth every 6 (six) hours as needed for mild pain (or temp >/= 101 F).   amLODipine (NORVASC) 10 MG tablet Take 1 tablet (10 mg total) by mouth daily.   atenolol (TENORMIN) 25 MG tablet Take 0.5 tablets (12.5 mg total) by mouth daily.   B Complex Vitamins (B COMPLEX-B12) TABS Take 1 tablet by mouth daily.    cholecalciferol (VITAMIN D) 1000 UNITS tablet Take 1,000 Units by mouth 2 (two) times a week. Takes on Mon and Fri   Cinnamon 500 MG capsule Take 1,000 mg by mouth 2 (two) times daily.   ELIQUIS 5 MG TABS tablet Take 1 tablet by mouth twice daily   ferrous sulfate 325 (65 FE) MG EC tablet Take 325 mg by mouth 3 (three) times daily with meals.   finasteride (PROSCAR) 5 MG tablet TAKE 1 TABLET BY MOUTH ONCE DAILY   fish oil-omega-3 fatty acids 1000 MG capsule Take 1,000 mg by mouth daily.   furosemide (LASIX) 40 MG tablet Take 1.5 tabs by mouth every Mon and Thursday   hydrALAZINE (APRESOLINE) 25 MG tablet TAKE 3 TABLETS BY MOUTH EVERY 8 HOURS (NEEDS  OFFICE  VISIT  BEFORE  ANY  FURTHER  REFILLS)   lisinopril (ZESTRIL) 40 MG tablet Take 1 tablet by mouth once daily   Multiple Vitamin (DAILY MULTIVITAMIN PO) Take 1 tablet by mouth daily.   ONETOUCH ULTRA test strip USE 1 STRIP TO CHECK GLUCOSE ONCE DAILY   pentoxifylline (TRENTAL) 400 MG CR tablet TAKE 1 TABLET BY MOUTH EVERY 12 HOURS   rosuvastatin (CRESTOR) 40 MG tablet TAKE 1 TABLET BY MOUTH ONCE DAILY (CALL  CLINIC  TO  SCHEDULE  AN  APPOINTMENT  FOR  FUTURE  REFILLS)   saccharomyces boulardii (FLORASTOR) 250 MG capsule Take 1 capsule (250 mg total) by mouth daily.   tamsulosin (FLOMAX) 0.4 MG CAPS capsule Take 1 capsule by mouth once daily   No  facility-administered encounter medications on file as of 01/21/2021.   New patient assistance application form filled out to [employer] for [employer]. Waiting for patient and provider to complete and sign documentation. Called patient to inquire if they wanted the application mailed to them or if they wanted to come into the office. Patient is required to sign application and to bring/have proof of income. He stated  he/she would be willing to come into office to bring proof of income and sign application once the paperwork comes in the mail he would like it mailed to their residence address [place] [address]  Follow-Up:Pharmacist Review   Veronica Mclemore, [place] Pharmacist Assistant [phone]

## 2021-02-03 ENCOUNTER — Other Ambulatory Visit: Payer: PPO | Admitting: *Deleted

## 2021-02-03 ENCOUNTER — Other Ambulatory Visit: Payer: Self-pay

## 2021-02-03 DIAGNOSIS — R609 Edema, unspecified: Secondary | ICD-10-CM | POA: Diagnosis not present

## 2021-02-03 LAB — BASIC METABOLIC PANEL
BUN/Creatinine Ratio: 18 (ref 10–24)
BUN: 27 mg/dL (ref 10–36)
CO2: 20 mmol/L (ref 20–29)
Calcium: 9.2 mg/dL (ref 8.6–10.2)
Chloride: 103 mmol/L (ref 96–106)
Creatinine, Ser: 1.5 mg/dL — ABNORMAL HIGH (ref 0.76–1.27)
Glucose: 101 mg/dL — ABNORMAL HIGH (ref 65–99)
Potassium: 4.5 mmol/L (ref 3.5–5.2)
Sodium: 137 mmol/L (ref 134–144)
eGFR: 44 mL/min/{1.73_m2} — ABNORMAL LOW (ref >59)

## 2021-02-03 LAB — PRO B NATRIURETIC PEPTIDE: NT-Pro BNP: 2204 pg/mL — ABNORMAL HIGH (ref 0–486)

## 2021-02-04 ENCOUNTER — Telehealth: Payer: Self-pay

## 2021-02-04 DIAGNOSIS — I10 Essential (primary) hypertension: Secondary | ICD-10-CM

## 2021-02-04 DIAGNOSIS — I4891 Unspecified atrial fibrillation: Secondary | ICD-10-CM

## 2021-02-04 DIAGNOSIS — I2510 Atherosclerotic heart disease of native coronary artery without angina pectoris: Secondary | ICD-10-CM

## 2021-02-04 NOTE — Telephone Encounter (Signed)
Pt advised his lab results and agreed to having an Echo done.

## 2021-02-04 NOTE — Telephone Encounter (Signed)
-----   Message from Victor, MD sent at 02/03/2021  9:54 PM EDT ----- Eric [name] function is stable from 1 month ago at 1.5   Still up from 1 year ag Fluid is still up I would recomm the pt be set up for an echo to evaluate pumpoing function of the hear

## 2021-02-04 NOTE — Progress Notes (Signed)
Done

## 2021-02-05 ENCOUNTER — Telehealth: Payer: Self-pay | Admitting: Pharmacist

## 2021-02-05 NOTE — Progress Notes (Addendum)
    Chronic Care Management Pharmacy Assistant   Name: Eric [name].  MRN: [id number] DOB: 03/06/1928  Reason for Encounter: Adherence Review  Reviewed the patients chart for any medical/health and/or medication changes there were not any changes at this time.    Follow-Up:Pharmacist Review   Charlann Lange, [place] Pharmacist Assistant [phone]

## 2021-02-06 ENCOUNTER — Other Ambulatory Visit: Payer: Self-pay | Admitting: Family Medicine

## 2021-02-14 ENCOUNTER — Other Ambulatory Visit: Payer: Self-pay | Admitting: Family Medicine

## 2021-03-04 ENCOUNTER — Other Ambulatory Visit: Payer: Self-pay

## 2021-03-04 ENCOUNTER — Ambulatory Visit (HOSPITAL_COMMUNITY): Payer: PPO | Attending: Internal Medicine

## 2021-03-04 DIAGNOSIS — I10 Essential (primary) hypertension: Secondary | ICD-10-CM

## 2021-03-04 DIAGNOSIS — I4891 Unspecified atrial fibrillation: Secondary | ICD-10-CM

## 2021-03-04 DIAGNOSIS — I2510 Atherosclerotic heart disease of native coronary artery without angina pectoris: Secondary | ICD-10-CM | POA: Diagnosis not present

## 2021-03-04 LAB — ECHOCARDIOGRAM COMPLETE
Area-P 1/2: 3.33 cm2
P 1/2 time: 638 msec
S' Lateral: 3.5 cm

## 2021-03-07 ENCOUNTER — Other Ambulatory Visit: Payer: Self-pay | Admitting: Family Medicine

## 2021-03-11 NOTE — Progress Notes (Signed)
03/12/2021 12:07 PM   Eric [name]. 03/01/1928 [id number]  Referring provider: [name], MD [address] [place],  [place] [address]  Urological history: 1. High risk hematuria -non-smoker -contrast CT 2020 -distended urinary bladder with bladder wall trabeculation and multiple bladder diverticula. There is a left lateral bladder wall diverticulum that demonstrates adjacent fat stranding and wall thickening. Large partially visualized right-sided hydrocele -cysto 2021 by Dr. Jeffie Pollock that showed a left lateral wall diverticulum with chronic inflammatory changes.  The repeat urine culture on that particular day was negative along with a urine cytology -no reports of gross heme -UA 3-10 RBC's   2. BPH with LU TS -contrast CT 2020 -distended urinary bladder with bladder wall trabeculation and multiple bladder diverticula. There is a left lateral bladder wall diverticulum that demonstrates adjacent fat stranding and wall thickening -cysto 2021 -left lateral wall diverticulum with chronic inflammatory changes -I PSS 23/2 -PVR 258 mL -managed with tamsulosin 0.4 mg daily and finasteride 5 mg daily   3. Large partially visualized right-sided hydrocele on 2020 contrast CT  Chief Complaint  Patient presents with   Benign Prostatic Hypertrophy     HPI: Eric Mckayis a 87 y.o. male who presents today for a 6 month follow up.  He has no new complaints at this visit.  He continues to have a weak urinary stream at times.  Patient denies any modifying or aggravating factors.  Patient denies any gross hematuria, dysuria or suprapubic/flank pain.  Patient denies any fevers, chills, nausea or vomiting.    His UA is nitrate positive, greater than 30 WBCs, 3-10 RBCs and many bacteria.  His PVR is 258 mL.    IPSS     Row Name 03/12/21 1100         International Prostate Symptom Score   How often have you had the sensation of not emptying your bladder? About  half the time     How often have you had to urinate less than every two hours? About half the time     How often have you found you stopped and started again several times when you urinated? Almost always     How often have you found it difficult to postpone urination? Less than 1 in 5 times     How often have you had a weak urinary stream? Almost always     How often have you had to strain to start urination? More than half the time     How many times did you typically get up at night to urinate? 2 Times     Total IPSS Score 23           Quality of Life due to urinary symptoms     If you were to spend the rest of your life with your urinary condition just the way it is now how would you feel about that? Mostly Satisfied             Score:  1-7 Mild 8-19 Moderate 20-35 Severe    PMH: Past Medical History:  Diagnosis Date   Atrial flutter (HCC)    s/p RFCA   CAD (coronary artery disease)    cath 2003, occluded S-RCA, occluded S-Dx, L-LAD ok, s/p PTCA to LAD   Cataract    CKD (chronic kidney disease) stage 3, GFR 30-59 ml/min (HCC)    Diabetes mellitus    diet controlled   Hearing aid worn  bilateral   History of kidney stones    Long term (current) use of anticoagulants    Neuromuscular disorder (HCC)    OSA (obstructive sleep apnea) 12/11   very mild, AHI 7/hr   Persistent atrial fibrillation ([place]) 09/26/2015   Pure hyperglyceridemia    PVD (peripheral vascular disease) ([place])    angioplasty of his right lower extremity in [place] by Dr.[name] 2013   Unspecified essential hypertension    Wears dentures    full upper and lower    Surgical History: Past Surgical History:  Procedure Laterality Date   ABDOMINAL AORTOGRAM W/LOWER EXTREMITY N/A 11/15/2017   Procedure: ABDOMINAL AORTOGRAM W/LOWER EXTREMITY;  Surgeon: [name], MD;  Location: [facility];  Service: Cardiovascular;  Laterality: N/A;   Adenosine Myoview  3/06   EF 56%, neg. Ischemia    Adenosine Myoview  02/18/07   nml   AMPUTATION Left 12/15/2017   Procedure: AMPUTATION BELOW KNEE;  Surgeon: Algernon [name], MD;  Location: [facility];  Service: Vascular;  Laterality: Left;   ANGIOPLASTY  1/99   CAD- diogonal with rotational artherectomy   Arthrectomy     of LAD & PTCA   BLEPHAROPLASTY Bilateral    CARDIAC CATHETERIZATION  1/00   CARDIOVERSION  1/04   CARDIOVERSION  5/07   hospital- a flutter   CARPAL TUNNEL RELEASE     ? bilateral   CATARACT EXTRACTION W/PHACO Right 07/28/2017   Procedure: CATARACT EXTRACTION PHACO AND INTRAOCULAR LENS PLACEMENT ([place]) RIGHT DIABETIC;  Surgeon: [name], MD;  Location: [facility];  Service: Ophthalmology;  Laterality: Right;  Diabetic - diet controlled   CATARACT EXTRACTION W/PHACO Left 08/18/2017   Procedure: CATARACT EXTRACTION PHACO AND INTRAOCULAR LENS PLACEMENT ([facility]);  Surgeon: [name], MD;  Location: [facility];  Service: Ophthalmology;  Laterality: Left;  DIABETES - oral meds   COLONOSCOPY W/ BIOPSIES  10/01/06   sigmoid polyp bx neg, 3 years   CORONARY ANGIOPLASTY  4/03   cutting balloon PTCA pLAD into Diag   CORONARY ARTERY BYPASS GRAFT  2000   LIMA-LAD, SVG-RCA, SVG-Diag; SVG-Diag & SVG-RCA occluded 2003   FRACTURE SURGERY     HAND SURGERY  08/27/09   R thumb procedure wit Scaphoid Gragt and screws, Dr [name]   LOWER EXTREMITY ANGIOGRAPHY Right 01/25/2019   Procedure: LOWER EXTREMITY ANGIOGRAPHY;  Surgeon: Katha Cabal, MD;  Location: [facility];  Service: Cardiovascular;  Laterality: Right;   NM MYOVIEW LTD  4/11   normal   PERIPHERAL VASCULAR CATHETERIZATION Right 06/27/2015   Procedure: Lower Extremity Angiography;  Surgeon: Algernon [name], MD;  Location: [facility];  Service: Cardiovascular;  Laterality: Right;   PERIPHERAL VASCULAR CATHETERIZATION  06/27/2015   Procedure: Lower Extremity Intervention;  Surgeon: Algernon [name], MD;  Location: [facility];   Service: Cardiovascular;;    Home Medications:  Allergies as of 03/12/2021       Reactions   Isosorbide Mononitrate Other (See Comments)   Unknown, doesn't remember    Ramipril Cough, Other (See Comments)   Quinidine Gluconate Rash   Quinidine Gluconate Other (See Comments), Rash        Medication List        Accurate as of March 12, 2021 12:07 PM. If you have any questions, ask your nurse or doctor.          acetaminophen 325 MG tablet Commonly known as: TYLENOL Take 2 tablets (650 mg total) by  mouth every 6 (six) hours as needed for mild pain (or temp >/= 101 F).   amLODipine 10 MG tablet Commonly known as: NORVASC Take 1 tablet (10 mg total) by mouth daily.   atenolol 25 MG tablet Commonly known as: TENORMIN Take 0.5 tablets (12.5 mg total) by mouth daily.   B Complex-B12 Tabs Take 1 tablet by mouth daily.   cholecalciferol 1000 units tablet Commonly known as: VITAMIN D Take 1,000 Units by mouth 2 (two) times a week. Takes on Mon and Fri   Cinnamon 500 MG capsule Take 1,000 mg by mouth 2 (two) times daily.   DAILY MULTIVITAMIN PO Take 1 tablet by mouth daily.   Eliquis 5 MG Tabs tablet Generic drug: apixaban Take 1 tablet by mouth twice daily   ferrous sulfate 325 (65 FE) MG EC tablet Take 325 mg by mouth 3 (three) times daily with meals.   finasteride 5 MG tablet Commonly known as: PROSCAR TAKE 1 TABLET BY MOUTH ONCE DAILY   fish oil-omega-3 fatty acids 1000 MG capsule Take 1,000 mg by mouth daily.   furosemide 40 MG tablet Commonly known as: LASIX TAKE 1 TABLET BY MOUTH ONCE DAILY AS NEEDED FOR  LEG  SWELLING   hydrALAZINE 25 MG tablet Commonly known as: APRESOLINE TAKE 3 TABLETS BY MOUTH EVERY 8 HOURS NEEDS  OFFICE  VISIT  BEFORE  ANY  FURTHER  REFILLS   lisinopril 40 MG tablet Commonly known as: ZESTRIL Take 1 tablet by mouth once daily   OneTouch Ultra test strip Generic drug: glucose blood USE 1 STRIP TO CHECK GLUCOSE ONCE DAILY    pentoxifylline 400 MG CR tablet Commonly known as: TRENTAL TAKE 1 TABLET BY MOUTH EVERY 12 HOURS   rosuvastatin 40 MG tablet Commonly known as: CRESTOR TAKE 1 TABLET BY MOUTH ONCE DAILY (CALL  CLINIC  TO  SCHEDULE  AN  APPOINTMENT  FOR  FUTURE  REFILLS)   saccharomyces boulardii 250 MG capsule Commonly known as: Florastor Take 1 capsule (250 mg total) by mouth daily.   tamsulosin 0.4 MG Caps capsule Commonly known as: FLOMAX Take 1 capsule by mouth once daily        Allergies:  Allergies  Allergen Reactions   Isosorbide Mononitrate Other (See Comments)    Unknown, doesn't remember    Ramipril Cough and Other (See Comments)   Quinidine Gluconate Rash   Quinidine Gluconate Other (See Comments) and Rash    Family History: Family History  Problem Relation Age of Onset   Stroke Father    Aneurysm Father    Hypertension Father    Prostate cancer Brother    Hypertension Mother    Lung cancer Brother        smoker   Thrombosis Brother    Other Brother        RF valve disorder (smoker)   Lung cancer Brother        smoker   Breast cancer Sister    Liver cancer Brother    Other Sister        cerebral hemorrhage    Social History:  reports that he has never smoked. He quit smokeless tobacco use about 28 years ago. He reports that he does not drink alcohol and does not use drugs.   Physical Exam: BP (!) 127/54   Pulse 67   Ht '5\' 5"'  (1.651 m)   Wt 163 lb (73.9 kg)   BMI 27.12 kg/m   Constitutional:  Well nourished. Alert and oriented,  No acute distress. HEENT: [place] AT, mask in place.  Trachea midline Cardiovascular: No clubbing, cyanosis, or edema. Respiratory: Normal respiratory effort, no increased work of breathing. Neurologic: Grossly intact, no focal deficits, moving all 4 extremities. Psychiatric: Normal mood and affect.   Laboratory Data: Recent Results (from the past 2160 hour(s))  Basic Metabolic Panel (BMET)     Status: Abnormal   Collection Time:  12/30/20 11:59 AM  Result Value Ref Range   Glucose 116 (H) 65 - 99 mg/dL   BUN 28 10 - 36 mg/dL   Creatinine, Ser 1.69 (H) 0.76 - 1.27 mg/dL   eGFR 38 (L) >59 mL/min/1.73   BUN/Creatinine Ratio 17 10 - 24   Sodium 138 134 - 144 mmol/L   Potassium 4.6 3.5 - 5.2 mmol/L   Chloride 104 96 - 106 mmol/L   CO2 19 (L) 20 - 29 mmol/L   Calcium 8.9 8.6 - 10.2 mg/dL  Pro b natriuretic peptide     Status: Abnormal   Collection Time: 12/30/20 11:59 AM  Result Value Ref Range   NT-Pro BNP 2,570 (H) 0 - 486 pg/mL    Comment: The following cut-points have been suggested for the use of proBNP for the diagnostic evaluation of heart failure (HF) in patients with acute dyspnea: Modality                     Age           Optimal Cut                            (years)            Point ------------------------------------------------------ Diagnosis (rule in HF)        <50            450 pg/mL                           50 - 75            900 pg/mL                               >75           1800 pg/mL Exclusion (rule out HF)  Age independent     300 pg/mL   T3, free     Status: None   Collection Time: 12/30/20 11:59 AM  Result Value Ref Range   T3, Free 2.6 2.0 - 4.4 pg/mL  T4, free     Status: None   Collection Time: 12/30/20 11:59 AM  Result Value Ref Range   Free T4 0.95 0.82 - 1.77 ng/dL  Hemoglobin A1c     Status: Abnormal   Collection Time: 12/31/20  8:33 AM  Result Value Ref Range   Hgb A1c MFr Bld 5.7 (H) <5.7 % of total Hgb    Comment: For someone without known diabetes, a hemoglobin  A1c value between 5.7% and 6.4% is consistent with prediabetes and should be confirmed with a  follow-up test. . For someone with known diabetes, a value <7% indicates that their diabetes is well controlled. A1c targets should be individualized based on duration of diabetes, age, comorbid conditions, and other considerations. . This assay result is consistent with an increased risk of  diabetes. . Currently, no consensus exists regarding use of hemoglobin  A1c for diagnosis of diabetes for children. .    Mean Plasma Glucose 117 mg/dL   eAG (mmol/L) 6.5 mmol/L  CBC with Differential/Platelet     Status: Abnormal   Collection Time: 12/31/20  8:33 AM  Result Value Ref Range   WBC 8.8 3.8 - 10.8 Thousand/uL   RBC 3.78 (L) 4.20 - 5.80 Million/uL   Hemoglobin 11.0 (L) 13.2 - 17.1 g/dL   HCT 35.3 (L) 38.5 - 50.0 %   MCV 93.4 80.0 - 100.0 fL   MCH 29.1 27.0 - 33.0 pg   MCHC 31.2 (L) 32.0 - 36.0 g/dL   RDW 14.3 11.0 - 15.0 %   Platelets 166 140 - 400 Thousand/uL   MPV 10.9 7.5 - 12.5 fL   Neutro Abs 4,972 1,500 - 7,800 cells/uL   Lymphs Abs 2,851 850 - 3,900 cells/uL   Absolute Monocytes 581 200 - 950 cells/uL   Eosinophils Absolute 317 15 - 500 cells/uL   Basophils Absolute 79 0 - 200 cells/uL   Neutrophils Relative % 56.5 %   Total Lymphocyte 32.4 %   Monocytes Relative 6.6 %   Eosinophils Relative 3.6 %   Basophils Relative 0.9 %  COMPLETE METABOLIC PANEL WITH GFR     Status: Abnormal   Collection Time: 12/31/20  8:33 AM  Result Value Ref Range   Glucose, Bld 160 (H) 65 - 99 mg/dL    Comment: .            Fasting reference interval . For someone without known diabetes, a glucose value >125 mg/dL indicates that they may have diabetes and this should be confirmed with a follow-up test. .    BUN 25 7 - 25 mg/dL   Creat 1.56 (H) 0.70 - 1.11 mg/dL    Comment: For patients >64 years of age, the reference limit for Creatinine is approximately 13% higher for people identified as African-American. .    GFR, Est Non African American 39 (L) > OR = 60 mL/min/1.38m   GFR, Est African American 45 (L) > OR = 60 mL/min/1.764m  BUN/Creatinine Ratio 16 6 - 22 (calc)   Sodium 136 135 - 146 mmol/L   Potassium 4.6 3.5 - 5.3 mmol/L   Chloride 105 98 - 110 mmol/L   CO2 26 20 - 32 mmol/L   Calcium 8.9 8.6 - 10.3 mg/dL   Total Protein 6.1 6.1 - 8.1 g/dL   Albumin 3.7  3.6 - 5.1 g/dL   Globulin 2.4 1.9 - 3.7 g/dL (calc)   AG Ratio 1.5 1.0 - 2.5 (calc)   Total Bilirubin 0.5 0.2 - 1.2 mg/dL   Alkaline phosphatase (APISO) 60 35 - 144 U/L   AST 44 (H) 10 - 35 U/L   ALT 37 9 - 46 U/L  Lipid panel     Status: Abnormal   Collection Time: 12/31/20  8:33 AM  Result Value Ref Range   Cholesterol 84 <200 mg/dL   HDL 28 (L) > OR = 40 mg/dL   Triglycerides 119 <150 mg/dL   LDL Cholesterol (Calc) 35 mg/dL (calc)    Comment: Reference range: <100 . Desirable range <100 mg/dL for primary prevention;   <70 mg/dL for patients with CHD or diabetic patients  with > or = 2 CHD risk factors. . Marland KitchenDL-C is now calculated using the Martin-Hopkins  calculation, which is a validated novel method providing  better accuracy than the Friedewald equation in the  estimation of LDL-C.  MaCresenciano Genret al.  JAMA. 2836;629(47): [phone]  (http://education.[employer].com/faq/FAQ164)    Total CHOL/HDL Ratio 3.0 <5.0 (calc)   Non-HDL Cholesterol (Calc) 56 <130 mg/dL (calc)    Comment: For patients with diabetes plus 1 major ASCVD risk  factor, treating to a non-HDL-C goal of <100 mg/dL  (LDL-C of <70 mg/dL) is considered a therapeutic  option.   Basic metabolic panel     Status: Abnormal   Collection Time: 02/03/21  9:20 AM  Result Value Ref Range   Glucose 101 (H) 65 - 99 mg/dL   BUN 27 10 - 36 mg/dL   Creatinine, Ser 1.50 (H) 0.76 - 1.27 mg/dL   eGFR 44 (L) >59 mL/min/1.73   BUN/Creatinine Ratio 18 10 - 24   Sodium 137 134 - 144 mmol/L   Potassium 4.5 3.5 - 5.2 mmol/L   Chloride 103 96 - 106 mmol/L   CO2 20 20 - 29 mmol/L   Calcium 9.2 8.6 - 10.2 mg/dL  Pro b natriuretic peptide (BNP)     Status: Abnormal   Collection Time: 02/03/21  9:20 AM  Result Value Ref Range   NT-Pro BNP 2,204 (H) 0 - 486 pg/mL    Comment: The following cut-points have been suggested for the use of proBNP for the diagnostic evaluation of heart failure (HF) in patients with acute  dyspnea: Modality                     Age           Optimal Cut                            (years)            Point ------------------------------------------------------ Diagnosis (rule in HF)        <50            450 pg/mL                           50 - 75            900 pg/mL                               >75           1800 pg/mL Exclusion (rule out HF)  Age independent     300 pg/mL   ECHOCARDIOGRAM COMPLETE     Status: None   Collection Time: 03/04/21 11:48 AM  Result Value Ref Range   Area-P 1/2 3.33 cm2   S' Lateral 3.50 cm   P 1/2 time 638 msec  Bladder Scan (Post Void Residual) in office     Status: None   Collection Time: 03/12/21 11:59 AM  Result Value Ref Range   Scan Result 297m   Urinalysis  Component     Latest Ref Rng & Units 03/12/2021  Specific Gravity, UA     1.005 - 1.030 1.015  pH, UA     5.0 - 7.5 5.5  Color, UA     Yellow Yellow  Appearance Ur     Clear Cloudy (A)  Leukocytes,UA     Negative 3+ (A)  Protein,UA     Negative/Trace 1+ (A)  Glucose, UA     Negative Negative  Ketones, UA     Negative Negative  RBC, UA     Negative Trace (A)  Bilirubin, UA     Negative Negative  Urobilinogen, Ur     0.2 - 1.0 mg/dL 0.2  Nitrite, UA     Negative Positive (A)  Microscopic Examination      See below:   Component     Latest Ref Rng & Units 03/12/2021          WBC, UA     0 - 5 /hpf >30 (A)  RBC     0 - 2 /hpf 3-10 (A)  Epithelial Cells (non renal)     0 - 10 /hpf 0-10  Bacteria, UA     None seen/Few Many (A)  I have reviewed the labs.   Pertinent Imaging Results for RAYSHAWN, MANEY (MRN [id number]) as of 03/12/2021 12:29  Ref. Range 03/12/2021 11:59  Scan Result Unknown 239m    Assessment & Plan:    1. BPH with urinary obstruction -Chronic severe outlet obstruction on maximal medical therapy -Given his age and comorbidities, his relatively poor surgical candidate for outlet procedure. -We will continue to monitor, ensure that he is  emptying and only intervene if absolutely necessary. -Continue tamsulosin 0.4 mg daily and finasteride 5 mg daily  2. Recurrent UTI -Personal history of "recurrent" UTI however he may be chronically colonized with Pseudomonas -In light of his history of C. difficile, would strongly recommend against treating unless he is having severe dysuria or gross hematuria or any other systemic signs of infection.  -UA is nitrate positive with pyuria and microscopic hematuria, I will send for culture, but we will not treat unless he becomes symptomatic   3. History of gross hematuria/microscopic hematuria -No further episodes of gross hematuria -Most recent cystoscopy was approximately a year ago with Dr. WJeffie Pollockwhich showed some chronic inflammation around his left-sided bladder diverticulum which may in fact be the source of #2  Return in about 6 months (around 09/12/2021) for UA, I PSS and PVR .     SZara Council PA-C  [facility] [address] [place] [phone]

## 2021-03-12 ENCOUNTER — Ambulatory Visit: Payer: PPO | Admitting: Urology

## 2021-03-12 ENCOUNTER — Encounter: Payer: Self-pay | Admitting: Urology

## 2021-03-12 ENCOUNTER — Other Ambulatory Visit: Payer: Self-pay

## 2021-03-12 VITALS — BP 127/54 | HR 67 | Ht 65.0 in | Wt 163.0 lb

## 2021-03-12 DIAGNOSIS — N401 Enlarged prostate with lower urinary tract symptoms: Secondary | ICD-10-CM

## 2021-03-12 DIAGNOSIS — R319 Hematuria, unspecified: Secondary | ICD-10-CM | POA: Diagnosis not present

## 2021-03-12 DIAGNOSIS — N138 Other obstructive and reflux uropathy: Secondary | ICD-10-CM

## 2021-03-12 DIAGNOSIS — N433 Hydrocele, unspecified: Secondary | ICD-10-CM

## 2021-03-12 LAB — URINALYSIS, COMPLETE
Bilirubin, UA: NEGATIVE
Glucose, UA: NEGATIVE
Ketones, UA: NEGATIVE
Nitrite, UA: POSITIVE — AB
Specific Gravity, UA: 1.015 (ref 1.005–1.030)
Urobilinogen, Ur: 0.2 mg/dL (ref 0.2–1.0)
pH, UA: 5.5 (ref 5.0–7.5)

## 2021-03-12 LAB — MICROSCOPIC EXAMINATION: WBC, UA: >30 /hpf — AB (ref 0–5)

## 2021-03-12 LAB — BLADDER SCAN AMB NON-IMAGING

## 2021-03-16 LAB — CULTURE, URINE COMPREHENSIVE

## 2021-04-04 ENCOUNTER — Telehealth: Payer: Self-pay

## 2021-04-09 ENCOUNTER — Other Ambulatory Visit: Payer: Self-pay | Admitting: Family Medicine

## 2021-04-10 ENCOUNTER — Telehealth: Payer: Self-pay | Admitting: Pharmacist

## 2021-04-10 NOTE — Progress Notes (Addendum)
    Chronic Care Management Pharmacy Assistant   Name: Eric [name].  MRN: [id number] DOB: 10-24-1927  Reason for Encounter: General Disease State Call   Conditions to be addressed/monitored: HTN, CAD, Type 2 DM.  Recent office visits:  None since 02/05/21  Recent consult visits:  03/12/21 Urology Ernestine Conrad, Hunt Oris, PA-C. For Benign prostatic Hypertrophy. No medication changes.   Hospital visits:  None since 02/05/21  Medications: Outpatient Encounter Medications as of 04/10/2021  Medication Sig   acetaminophen (TYLENOL) 325 MG tablet Take 2 tablets (650 mg total) by mouth every 6 (six) hours as needed for mild pain (or temp >/= 101 F).   amLODipine (NORVASC) 10 MG tablet Take 1 tablet (10 mg total) by mouth daily.   atenolol (TENORMIN) 25 MG tablet Take 0.5 tablets (12.5 mg total) by mouth daily.   B Complex Vitamins (B COMPLEX-B12) TABS Take 1 tablet by mouth daily.    cholecalciferol (VITAMIN D) 1000 UNITS tablet Take 1,000 Units by mouth 2 (two) times a week. Takes on Mon and Fri   Cinnamon 500 MG capsule Take 1,000 mg by mouth 2 (two) times daily.   ELIQUIS 5 MG TABS tablet Take 1 tablet by mouth twice daily   ferrous sulfate 325 (65 FE) MG EC tablet Take 325 mg by mouth 3 (three) times daily with meals.   finasteride (PROSCAR) 5 MG tablet TAKE 1 TABLET BY MOUTH ONCE DAILY   fish oil-omega-3 fatty acids 1000 MG capsule Take 1,000 mg by mouth daily.   furosemide (LASIX) 40 MG tablet TAKE 1 TABLET BY MOUTH ONCE DAILY AS NEEDED FOR  LEG  SWELLING   hydrALAZINE (APRESOLINE) 25 MG tablet TAKE 3 TABLETS BY MOUTH EVERY 8 HOURS NEEDS  OFFICE  VISIT  BEFORE  ANY  FURTHER  REFILLS   lisinopril (ZESTRIL) 40 MG tablet Take 1 tablet by mouth once daily   Multiple Vitamin (DAILY MULTIVITAMIN PO) Take 1 tablet by mouth daily.   ONETOUCH ULTRA test strip USE 1 STRIP TO CHECK GLUCOSE ONCE DAILY   pentoxifylline (TRENTAL) 400 MG CR tablet TAKE 1 TABLET BY MOUTH EVERY 12 HOURS   rosuvastatin  (CRESTOR) 40 MG tablet TAKE 1 TABLET BY MOUTH ONCE DAILY (CALL  CLINIC  TO  SCHEDULE  AN  APPOINTMENT  FOR  FUTURE  REFILLS)   saccharomyces boulardii (FLORASTOR) 250 MG capsule Take 1 capsule (250 mg total) by mouth daily.   tamsulosin (FLOMAX) 0.4 MG CAPS capsule Take 1 capsule by mouth once daily   No facility-administered encounter medications on file as of 04/10/2021.   GEN CALL: Patient stated he is overall doing great. He stated he is still able to get around pretty good. He stated he does not have any trouble eating, he stated his appetite is very good. He stated he does not have any questions or concerns about his medication at this time. The patient stated all of his medications are within his budget at this time. Reminded him that he has a follow-up with the Pharm D Leata Mouse.  Star Rating Drugs: Lisinopril 40 mg 02/06/21 90 DS, Rosuvastatin 40 mg 01/24/21 90 DS.   Follow-Up:Pharmacist Review  Charlann Lange, RMA Clinical Pharmacist Assistant [phone]  10 minutes spent in review, coordination, and documentation.  Reviewed by: Beverly Milch, PharmD Clinical Pharmacist [phone]

## 2021-04-11 ENCOUNTER — Other Ambulatory Visit: Payer: Self-pay | Admitting: Family Medicine

## 2021-04-25 ENCOUNTER — Other Ambulatory Visit: Payer: Self-pay | Admitting: Family Medicine

## 2021-04-27 ENCOUNTER — Inpatient Hospital Stay (HOSPITAL_COMMUNITY)
Admission: EM | Admit: 2021-04-27 | Discharge: 2021-05-01 | DRG: 378 | Disposition: A | Discharge status: Home / Self Care | Payer: PPO | Attending: Internal Medicine | Admitting: Internal Medicine

## 2021-04-27 DIAGNOSIS — N138 Other obstructive and reflux uropathy: Secondary | ICD-10-CM | POA: Diagnosis not present

## 2021-04-27 DIAGNOSIS — I272 Pulmonary hypertension, unspecified: Secondary | ICD-10-CM | POA: Diagnosis present

## 2021-04-27 DIAGNOSIS — I4891 Unspecified atrial fibrillation: Secondary | ICD-10-CM | POA: Diagnosis not present

## 2021-04-27 DIAGNOSIS — L89152 Pressure ulcer of sacral region, stage 2: Secondary | ICD-10-CM | POA: Diagnosis present

## 2021-04-27 DIAGNOSIS — I2510 Atherosclerotic heart disease of native coronary artery without angina pectoris: Secondary | ICD-10-CM | POA: Diagnosis not present

## 2021-04-27 DIAGNOSIS — I4892 Unspecified atrial flutter: Secondary | ICD-10-CM | POA: Diagnosis not present

## 2021-04-27 DIAGNOSIS — I10 Essential (primary) hypertension: Secondary | ICD-10-CM | POA: Diagnosis present

## 2021-04-27 DIAGNOSIS — Z800 Family history of malignant neoplasm of digestive organs: Secondary | ICD-10-CM

## 2021-04-27 DIAGNOSIS — K648 Other hemorrhoids: Secondary | ICD-10-CM | POA: Diagnosis not present

## 2021-04-27 DIAGNOSIS — Z803 Family history of malignant neoplasm of breast: Secondary | ICD-10-CM

## 2021-04-27 DIAGNOSIS — K922 Gastrointestinal hemorrhage, unspecified: Secondary | ICD-10-CM | POA: Diagnosis not present

## 2021-04-27 DIAGNOSIS — I4821 Permanent atrial fibrillation: Secondary | ICD-10-CM | POA: Diagnosis not present

## 2021-04-27 DIAGNOSIS — D72829 Elevated white blood cell count, unspecified: Secondary | ICD-10-CM | POA: Diagnosis not present

## 2021-04-27 DIAGNOSIS — E875 Hyperkalemia: Secondary | ICD-10-CM | POA: Diagnosis present

## 2021-04-27 DIAGNOSIS — G4733 Obstructive sleep apnea (adult) (pediatric): Secondary | ICD-10-CM | POA: Diagnosis present

## 2021-04-27 DIAGNOSIS — Z20822 Contact with and (suspected) exposure to covid-19: Secondary | ICD-10-CM | POA: Diagnosis not present

## 2021-04-27 DIAGNOSIS — D6832 Hemorrhagic disorder due to extrinsic circulating anticoagulants: Secondary | ICD-10-CM | POA: Diagnosis not present

## 2021-04-27 DIAGNOSIS — D124 Benign neoplasm of descending colon: Secondary | ICD-10-CM | POA: Diagnosis not present

## 2021-04-27 DIAGNOSIS — D62 Acute posthemorrhagic anemia: Secondary | ICD-10-CM | POA: Diagnosis not present

## 2021-04-27 DIAGNOSIS — E782 Mixed hyperlipidemia: Secondary | ICD-10-CM | POA: Diagnosis present

## 2021-04-27 DIAGNOSIS — Z7901 Long term (current) use of anticoagulants: Secondary | ICD-10-CM | POA: Diagnosis not present

## 2021-04-27 DIAGNOSIS — N400 Enlarged prostate without lower urinary tract symptoms: Secondary | ICD-10-CM | POA: Diagnosis not present

## 2021-04-27 DIAGNOSIS — N189 Chronic kidney disease, unspecified: Secondary | ICD-10-CM | POA: Diagnosis not present

## 2021-04-27 DIAGNOSIS — Z801 Family history of malignant neoplasm of trachea, bronchus and lung: Secondary | ICD-10-CM | POA: Diagnosis not present

## 2021-04-27 DIAGNOSIS — E11621 Type 2 diabetes mellitus with foot ulcer: Secondary | ICD-10-CM | POA: Diagnosis not present

## 2021-04-27 DIAGNOSIS — T45515A Adverse effect of anticoagulants, initial encounter: Secondary | ICD-10-CM | POA: Diagnosis present

## 2021-04-27 DIAGNOSIS — E852 Heredofamilial amyloidosis, unspecified: Secondary | ICD-10-CM | POA: Diagnosis present

## 2021-04-27 DIAGNOSIS — Z888 Allergy status to other drugs, medicaments and biological substances status: Secondary | ICD-10-CM

## 2021-04-27 DIAGNOSIS — D123 Benign neoplasm of transverse colon: Secondary | ICD-10-CM

## 2021-04-27 DIAGNOSIS — K644 Residual hemorrhoidal skin tags: Secondary | ICD-10-CM | POA: Diagnosis not present

## 2021-04-27 DIAGNOSIS — L89616 Pressure-induced deep tissue damage of right heel: Secondary | ICD-10-CM | POA: Diagnosis present

## 2021-04-27 DIAGNOSIS — K635 Polyp of colon: Secondary | ICD-10-CM | POA: Diagnosis not present

## 2021-04-27 DIAGNOSIS — Z89512 Acquired absence of left leg below knee: Secondary | ICD-10-CM

## 2021-04-27 DIAGNOSIS — E1151 Type 2 diabetes mellitus with diabetic peripheral angiopathy without gangrene: Secondary | ICD-10-CM | POA: Diagnosis not present

## 2021-04-27 DIAGNOSIS — Z951 Presence of aortocoronary bypass graft: Secondary | ICD-10-CM

## 2021-04-27 DIAGNOSIS — Z8042 Family history of malignant neoplasm of prostate: Secondary | ICD-10-CM

## 2021-04-27 DIAGNOSIS — N179 Acute kidney failure, unspecified: Secondary | ICD-10-CM

## 2021-04-27 DIAGNOSIS — K5731 Diverticulosis of large intestine without perforation or abscess with bleeding: Principal | ICD-10-CM

## 2021-04-27 DIAGNOSIS — K2901 Acute gastritis with bleeding: Secondary | ICD-10-CM | POA: Diagnosis not present

## 2021-04-27 DIAGNOSIS — N401 Enlarged prostate with lower urinary tract symptoms: Secondary | ICD-10-CM | POA: Diagnosis not present

## 2021-04-27 DIAGNOSIS — Z9861 Coronary angioplasty status: Secondary | ICD-10-CM

## 2021-04-27 DIAGNOSIS — Z823 Family history of stroke: Secondary | ICD-10-CM

## 2021-04-27 DIAGNOSIS — Z8249 Family history of ischemic heart disease and other diseases of the circulatory system: Secondary | ICD-10-CM

## 2021-04-27 DIAGNOSIS — Z79899 Other long term (current) drug therapy: Secondary | ICD-10-CM

## 2021-04-27 LAB — CBC
HCT: 27.7 % — ABNORMAL LOW (ref 39.0–52.0)
Hemoglobin: 9 g/dL — ABNORMAL LOW (ref 13.0–17.0)
MCH: 30.8 pg (ref 26.0–34.0)
MCHC: 32.5 g/dL (ref 30.0–36.0)
MCV: 94.9 fL (ref 80.0–100.0)
Platelets: 150 10*3/uL (ref 150–400)
RBC: 2.92 MIL/uL — ABNORMAL LOW (ref 4.22–5.81)
RDW: 15.3 % (ref 11.5–15.5)
WBC: 12 10*3/uL — ABNORMAL HIGH (ref 4.0–10.5)
nRBC: 0 % (ref 0.0–0.2)

## 2021-04-27 LAB — CBC WITH DIFFERENTIAL/PLATELET
Abs Immature Granulocytes: 0.04 10*3/uL (ref 0.00–0.07)
Basophils Absolute: 0.1 10*3/uL (ref 0.0–0.1)
Basophils Relative: 1 %
Eosinophils Absolute: 0.2 10*3/uL (ref 0.0–0.5)
Eosinophils Relative: 1 %
HCT: 30.1 % — ABNORMAL LOW (ref 39.0–52.0)
Hemoglobin: 9.6 g/dL — ABNORMAL LOW (ref 13.0–17.0)
Immature Granulocytes: 0 %
Lymphocytes Relative: 27 %
Lymphs Abs: 3.4 10*3/uL (ref 0.7–4.0)
MCH: 30.7 pg (ref 26.0–34.0)
MCHC: 31.9 g/dL (ref 30.0–36.0)
MCV: 96.2 fL (ref 80.0–100.0)
Monocytes Absolute: 0.9 10*3/uL (ref 0.1–1.0)
Monocytes Relative: 7 %
Neutro Abs: 8 10*3/uL — ABNORMAL HIGH (ref 1.7–7.7)
Neutrophils Relative %: 64 %
Platelets: 178 10*3/uL (ref 150–400)
RBC: 3.13 MIL/uL — ABNORMAL LOW (ref 4.22–5.81)
RDW: 15.4 % (ref 11.5–15.5)
WBC: 12.6 10*3/uL — ABNORMAL HIGH (ref 4.0–10.5)
nRBC: 0 % (ref 0.0–0.2)

## 2021-04-27 LAB — COMPREHENSIVE METABOLIC PANEL
ALT: 37 U/L (ref 0–44)
AST: 44 U/L — ABNORMAL HIGH (ref 15–41)
Albumin: 2.8 g/dL — ABNORMAL LOW (ref 3.5–5.0)
Alkaline Phosphatase: 52 U/L (ref 38–126)
Anion gap: 9 (ref 5–15)
BUN: 40 mg/dL — ABNORMAL HIGH (ref 8–23)
CO2: 20 mmol/L — ABNORMAL LOW (ref 22–32)
Calcium: 9.1 mg/dL (ref 8.9–10.3)
Chloride: 105 mmol/L (ref 98–111)
Creatinine, Ser: 2.12 mg/dL — ABNORMAL HIGH (ref 0.61–1.24)
GFR, Estimated: 29 mL/min — ABNORMAL LOW (ref >60)
Glucose, Bld: 130 mg/dL — ABNORMAL HIGH (ref 70–99)
Potassium: 5.3 mmol/L — ABNORMAL HIGH (ref 3.5–5.1)
Sodium: 134 mmol/L — ABNORMAL LOW (ref 135–145)
Total Bilirubin: 0.5 mg/dL (ref 0.3–1.2)
Total Protein: 6.3 g/dL — ABNORMAL LOW (ref 6.5–8.1)

## 2021-04-27 LAB — LIPASE, BLOOD: Lipase: 25 U/L (ref 11–51)

## 2021-04-27 LAB — LACTIC ACID, PLASMA: Lactic Acid, Venous: 1.2 mmol/L (ref 0.5–1.9)

## 2021-04-27 LAB — PROTIME-INR
INR: 1.7 — ABNORMAL HIGH (ref 0.8–1.2)
Prothrombin Time: 19.8 seconds — ABNORMAL HIGH (ref 11.4–15.2)

## 2021-04-27 MED ORDER — SODIUM CHLORIDE 0.9 % IV SOLN: INTRAVENOUS | Status: AC

## 2021-04-27 MED ORDER — ACETAMINOPHEN 650 MG RE SUPP: 650.0000 mg | Freq: Four times a day (QID) | RECTAL | Status: DC | PRN

## 2021-04-27 MED ORDER — FINASTERIDE 5 MG PO TABS
5.0000 mg | ORAL_TABLET | Freq: Every day | ORAL | Status: DC
  Administered 2021-04-28 – 2021-05-01 (×4): 5 mg via ORAL
  Filled 2021-04-27 (×4): qty 1

## 2021-04-27 MED ORDER — ROSUVASTATIN CALCIUM 20 MG PO TABS
40.0000 mg | ORAL_TABLET | Freq: Every day | ORAL | Status: DC
  Administered 2021-04-28 – 2021-05-01 (×4): 40 mg via ORAL
  Filled 2021-04-27 (×4): qty 2

## 2021-04-27 MED ORDER — PANTOPRAZOLE 80MG IVPB - SIMPLE MED
80.0000 mg | Freq: Once | INTRAVENOUS | Status: AC
  Administered 2021-04-27: 80 mg via INTRAVENOUS
  Filled 2021-04-27: qty 80

## 2021-04-27 MED ORDER — ACETAMINOPHEN 325 MG PO TABS: 650.0000 mg | ORAL_TABLET | Freq: Four times a day (QID) | ORAL | Status: DC | PRN

## 2021-04-27 MED ORDER — PEG 3350-KCL-NA BICARB-NACL 420 G PO SOLR
4000.0000 mL | Freq: Once | ORAL | Status: AC
  Administered 2021-04-28: 4000 mL via ORAL
  Filled 2021-04-27: qty 4000

## 2021-04-27 MED ORDER — TAMSULOSIN HCL 0.4 MG PO CAPS
0.4000 mg | ORAL_CAPSULE | Freq: Every day | ORAL | Status: DC
  Administered 2021-04-28 – 2021-05-01 (×4): 0.4 mg via ORAL
  Filled 2021-04-27 (×4): qty 1

## 2021-04-27 NOTE — ED Provider Notes (Signed)
[facility] EMERGENCY DEPARTMENT Provider Note   CSN: [id number] Arrival date & time: 04/27/21  1857     History Chief Complaint  Patient presents with   Rectal Bleeding    Eric [name]. is a 87 y.o. male with PMHx atrial fibrillation (on Eliquis), HTN, HLD, CAD,  CKD, left BKA who presents for evaluation of rectal bleeding.  Patient reports that he began to experience passage of bright red blood per rectum on Thursday.  Over the following days, he notes that he has had increased bleeding, primarily with bowel movements.  He noted today that he had 4 bowel movements, with a mixture of bright red blood, clots, and melanotic stool.  He notes that he has been feeling increasingly short of breath over this time.  He does take Eliquis for atrial fibrillation, and he states that he took his last dose this morning.  He took his blood pressure multiple times at home today and was normotensive.  After discussion with his family, who presented to our emergency department for further evaluation.  Of note, patient states that his last colonoscopy was over 10 years ago.  Upon chart review, it appears that he had a colonoscopy in 2008 with low-power gastroenterology.  He denies any prior EGD.  No history of cirrhosis.    Past Medical History:  Diagnosis Date   Atrial flutter ([place])    s/p RFCA   CAD (coronary artery disease)    cath 2003, occluded S-RCA, occluded S-Dx, L-LAD ok, s/p PTCA to LAD   Cataract    CKD (chronic kidney disease) stage 3, GFR 30-59 ml/min (HCC)    Diabetes mellitus    diet controlled   Hearing aid worn    bilateral   History of kidney stones    Long term (current) use of anticoagulants    Neuromuscular disorder (HCC)    OSA (obstructive sleep apnea) 12/11   very mild, AHI 7/hr   Persistent atrial fibrillation ([place]) 09/26/2015   Pure hyperglyceridemia    PVD (peripheral vascular disease) ([place])    angioplasty of his right lower extremity in  [place] by Dr.[name] 2013   Unspecified essential hypertension    Wears dentures    full upper and lower    Patient Active Problem List   Diagnosis Date Noted   GI bleed 04/27/2021   Hematuria 07/24/2020   Hypertensive urgency 03/08/2019   Hx of BKA, left ([place]) 01/13/2018   Nausea and vomiting in adult 12/26/2017   Pressure injury of skin 12/23/2017   C. difficile colitis 12/22/2017   UTI (urinary tract infection) 12/22/2017   Hyponatremia 12/22/2017   Normocytic anemia 12/22/2017   ASO (arteriosclerosis obliterans) 11/23/2017   Chronic anticoagulation 09/26/2015   History of anticoagulant therapy 09/26/2015   PVD (peripheral vascular disease) ([place]) 06/02/2012   Type 2 diabetes mellitus ([place]) 05/17/2012   Benign prostatic hyperplasia 11/16/2011   AKI (acute kidney injury) ([place]) 08/26/2011   Chronic kidney disease (CKD), stage III (moderate) ([place]) 08/26/2011   Chronic coronary artery disease 03/25/2011   Obstructive sleep apnea 09/15/2010   Other specified extrapyramidal and movement disorders 09/15/2010   PERSONAL HISTORY OF COLONIC POLYPS 10/25/2009   Vitamin B-complex deficiency 03/04/2009   Atrial flutter ([place]) 12/25/2008   Hypertriglyceridemia 11/24/2006   Essential hypertension 11/24/2006    Past Surgical History:  Procedure Laterality Date   ABDOMINAL AORTOGRAM W/LOWER EXTREMITY N/A 11/15/2017   Procedure: ABDOMINAL AORTOGRAM W/LOWER EXTREMITY;  Surgeon: [name], MD;  Location:  [place] INVASIVE CV LAB;  Service: Cardiovascular;  Laterality: N/A;   Adenosine Myoview  3/06   EF 56%, neg. Ischemia   Adenosine Myoview  02/18/07   nml   AMPUTATION Left 12/15/2017   Procedure: AMPUTATION BELOW KNEE;  Surgeon: Algernon [name], MD;  Location: [facility] ORS;  Service: Vascular;  Laterality: Left;   ANGIOPLASTY  1/99   CAD- diogonal with rotational artherectomy   Arthrectomy     of LAD & PTCA   BLEPHAROPLASTY Bilateral    CARDIAC CATHETERIZATION  1/00   CARDIOVERSION  1/04    CARDIOVERSION  5/07   hospital- a flutter   CARPAL TUNNEL RELEASE     ? bilateral   CATARACT EXTRACTION W/PHACO Right 07/28/2017   Procedure: CATARACT EXTRACTION PHACO AND INTRAOCULAR LENS PLACEMENT ([place]) RIGHT DIABETIC;  Surgeon: [name], MD;  Location: [place];  Service: Ophthalmology;  Laterality: Right;  Diabetic - diet controlled   CATARACT EXTRACTION W/PHACO Left 08/18/2017   Procedure: CATARACT EXTRACTION PHACO AND INTRAOCULAR LENS PLACEMENT (I[facility]);  Surgeon: [name], MD;  Location: [place];  Service: Ophthalmology;  Laterality: Left;  DIABETES - oral meds   COLONOSCOPY W/ BIOPSIES  10/01/06   sigmoid polyp bx neg, 3 years   CORONARY ANGIOPLASTY  4/03   cutting balloon PTCA pLAD into Diag   CORONARY ARTERY BYPASS GRAFT  2000   LIMA-LAD, SVG-RCA, SVG-Diag; SVG-Diag & SVG-RCA occluded 2003   FRACTURE SURGERY     HAND SURGERY  08/27/09   R thumb procedure wit Scaphoid Gragt and screws, Dr [name]   LOWER EXTREMITY ANGIOGRAPHY Right 01/25/2019   Procedure: LOWER EXTREMITY ANGIOGRAPHY;  Surgeon: Katha Cabal, MD;  Location: [place] CV LAB;  Service: Cardiovascular;  Laterality: Right;   NM MYOVIEW LTD  4/11   normal   PERIPHERAL VASCULAR CATHETERIZATION Right 06/27/2015   Procedure: Lower Extremity Angiography;  Surgeon: Algernon [name], MD;  Location: [place] CV LAB;  Service: Cardiovascular;  Laterality: Right;   PERIPHERAL VASCULAR CATHETERIZATION  06/27/2015   Procedure: Lower Extremity Intervention;  Surgeon: Algernon [name], MD;  Location: [place] CV LAB;  Service: Cardiovascular;;       Family History  Problem Relation Age of Onset   Stroke Father    Aneurysm Father    Hypertension Father    Prostate cancer Brother    Hypertension Mother    Lung cancer Brother        smoker   Thrombosis Brother    Other Brother        RF valve disorder (smoker)   Lung cancer Brother        smoker   Breast cancer Sister     Liver cancer Brother    Other Sister        cerebral hemorrhage    Social History   Tobacco Use   Smoking status: Never   Smokeless tobacco: Former    Quit date: 1994  Scientific laboratory technician Use: Never used  Substance Use Topics   Alcohol use: No   Drug use: No    Home Medications Prior to Admission medications   Medication Sig Start Date End Date Taking? Authorizing Provider  acetaminophen (TYLENOL) 325 MG tablet Take 2 tablets (650 mg total) by mouth every 6 (six) hours as needed for mild pain (or temp >/= 101 F). 12/21/17  Yes Wieting, Richard, MD  amLODipine (NORVASC) 10 MG tablet Take 1 tablet (10 mg total) by mouth daily. 06/14/20  Yes [name], MD  atenolol (TENORMIN) 25 MG tablet Take 0.5 tablets (12.5 mg total) by mouth daily. Patient taking differently: Take 12.5 mg by mouth at bedtime. 04/25/20  Yes Fay Records, MD  B Complex Vitamins (B COMPLEX-B12) TABS Take 1 tablet by mouth daily.    Yes [provider]  cholecalciferol (VITAMIN D) 1000 UNITS tablet Take 1,000 Units by mouth 2 (two) times a week. Takes on Mon and Fri   Yes [provider]  Cinnamon 500 MG capsule Take 1,000 mg by mouth 2 (two) times daily.   Yes [provider]  ELIQUIS 5 MG TABS tablet Take 1 tablet by mouth twice daily Patient taking differently: Take 5 mg by mouth 2 (two) times daily. 11/15/20  Yes [name], MD  ferrous sulfate 325 (65 FE) MG EC tablet Take 325 mg by mouth at bedtime.   Yes [provider]  finasteride (PROSCAR) 5 MG tablet TAKE 1 TABLET BY MOUTH ONCE DAILY Patient taking differently: Take 5 mg by mouth daily. 03/28/18  Yes [name], MD  fish oil-omega-3 fatty acids 1000 MG capsule Take 1,000 mg by mouth in the morning and at bedtime.   Yes [provider]  furosemide (LASIX) 40 MG tablet TAKE 1 TABLET BY MOUTH ONCE DAILY AS NEEDED FOR  LEG  SWELLING Patient taking differently: Take 40 mg by mouth daily as needed for  fluid or edema. 04/11/21  Yes [name], MD  hydrALAZINE (APRESOLINE) 25 MG tablet TAKE 3 TABLETS BY MOUTH EVERY 8 HOURS (NEEDS  OFFICE  VISIT  BEFORE  ANY  FURTHER  REFILLS) Patient taking differently: Take 75 mg by mouth every 8 (eight) hours. 04/10/21  Yes [name], MD  lisinopril (ZESTRIL) 40 MG tablet Take 1 tablet by mouth once daily Patient taking differently: Take 40 mg by mouth daily. 02/06/21  Yes [name], MD  Multiple Vitamin (DAILY MULTIVITAMIN PO) Take 1 tablet by mouth daily.   Yes [provider]  ONETOUCH ULTRA test strip USE 1 STRIP TO CHECK GLUCOSE ONCE DAILY 07/03/20  Yes [name], MD  pentoxifylline (TRENTAL) 400 MG CR tablet TAKE 1 TABLET BY MOUTH EVERY 12 HOURS Patient taking differently: Take 400 mg by mouth in the morning and at bedtime. 04/25/21  Yes [name], MD  rosuvastatin (CRESTOR) 40 MG tablet TAKE 1 TABLET BY MOUTH ONCE DAILY (CALL  CLINIC  TO  SCHEDULE  AN  APPOINTMENT  FOR  FUTURE  REFILLS) Patient taking differently: Take 40 mg by mouth daily. 07/23/20  Yes [name], MD  saccharomyces boulardii (FLORASTOR) 250 MG capsule Take 1 capsule (250 mg total) by mouth daily. 06/10/18  Yes [name], MD  tamsulosin (FLOMAX) 0.4 MG CAPS capsule Take 1 capsule by mouth once daily Patient taking differently: Take 0.4 mg by mouth daily. 04/25/21  Yes [name], MD    Allergies    Isosorbide mononitrate, Ramipril, Quinidine gluconate, and Quinidine gluconate  Review of Systems   Review of Systems  Constitutional:  Negative for chills and fever.  HENT:  Negative for ear pain and sore throat.   Eyes:  Negative for pain and visual disturbance.  Respiratory:  Negative for cough and shortness of breath.   Cardiovascular:  Negative for chest pain and palpitations.  Gastrointestinal:  Positive for anal bleeding and blood in stool. Negative for abdominal pain and vomiting.  Genitourinary:  Negative for  dysuria  and hematuria.  Musculoskeletal:  Negative for arthralgias and back pain.  Skin:  Negative for color change and rash.  Neurological:  Negative for seizures and syncope.  All other systems reviewed and are negative.  Physical Exam Updated Vital Signs BP (!) 140/47 (BP Location: Right Arm)   Pulse 70   Temp 98.5 F (36.9 C) (Oral)   Resp 16   SpO2 98%   Physical Exam Vitals and nursing note reviewed.  Constitutional:      Appearance: He is well-developed.  HENT:     Head: Normocephalic and atraumatic.  Eyes:     Conjunctiva/sclera: Conjunctivae normal.  Cardiovascular:     Rate and Rhythm: Normal rate and regular rhythm.     Heart sounds: No murmur heard. Pulmonary:     Effort: Pulmonary effort is normal. No respiratory distress.     Breath sounds: Normal breath sounds.  Abdominal:     Palpations: Abdomen is soft.     Tenderness: There is no abdominal tenderness.  Genitourinary:    Comments: Frank blood on DRE. Musculoskeletal:     Cervical back: Neck supple.  Skin:    General: Skin is warm and dry.  Neurological:     Mental Status: He is alert.    ED Results / Procedures / Treatments   Labs (all labs ordered are listed, but only abnormal results are displayed) Labs Reviewed  CBC WITH DIFFERENTIAL/PLATELET - Abnormal; Notable for the following components:      Result Value   WBC 12.6 (*)    RBC 3.13 (*)    Hemoglobin 9.6 (*)    HCT 30.1 (*)    Neutro Abs 8.0 (*)    All other components within normal limits  COMPREHENSIVE METABOLIC PANEL - Abnormal; Notable for the following components:   Sodium 134 (*)    Potassium 5.3 (*)    CO2 20 (*)    Glucose, Bld 130 (*)    BUN 40 (*)    Creatinine, Ser 2.12 (*)    Total Protein 6.3 (*)    Albumin 2.8 (*)    AST 44 (*)    GFR, Estimated 29 (*)    All other components within normal limits  PROTIME-INR - Abnormal; Notable for the following components:   Prothrombin Time 19.8 (*)    INR 1.7 (*)    All  other components within normal limits  LIPASE, BLOOD  LACTIC ACID, PLASMA  LACTIC ACID, PLASMA  TYPE AND SCREEN    EKG None  Radiology No results found.  Procedures Procedures   Medications Ordered in ED Medications  pantoprazole (PROTONIX) 80 mg /NS 100 mL IVPB (80 mg Intravenous New Bag/Given 04/27/21 2114)    ED Course  I have reviewed the triage vital signs and the nursing notes.  Pertinent labs & imaging results that were available during my care of the patient were reviewed by me and considered in my medical decision making (see chart for details).  Clinical Course as of 04/27/21 2130  Nancy Fetter [birth year]  [id number] 87 year old male brought in by his son for evaluation of rectal bleeding.  He has had multiple episodes over the last few days and for today.  He is on blood thinners.  He cannot recall his last colonoscopy but thinks is probably at least 39 or 87 years old.  Getting labs fluids and will consult GI. [MB]    Clinical Course User Index [MB] Hayden Rasmussen, MD   MDM Rules/Calculators/A&P  87 y.o. male with past medical history as above who presents for evaluation of GI bleeding.  His symptoms began on Thursday and have gradually worsened over this time.  He is feeling short of breath today.  He takes Eliquis for atrial fibrillation, with last dose this morning.  Afebrile and hemodynamically stable.  Exam as detailed above, frank blood on DRE.  CBC demonstrates hemoglobin drop from 11 to 9.6.  AKI on CKD.  No transfusion indication at this time.  Patient was given 80 mg of IV Protonix.  No indication for octreotide or Rocephin given no prior history of cirrhosis.  Gastroenterology was consulted for further recommendations, which included n.p.o. and bowel prep for colonoscopy tomorrow.  He recommends holding off on CTA at this time.  Patient was discussed with hospitalist who accepted the patient for admission.  Final Clinical Impression(s) / ED  Diagnoses Final diagnoses:  Acute GI bleeding    Rx / DC Orders ED Discharge Orders     None        Violet Baldy, MD 04/27/21 2131    Hayden Rasmussen, MD 04/28/21 1025    Hayden Rasmussen, MD 05/12/21 1740

## 2021-04-27 NOTE — ED Provider Notes (Signed)
Emergency Medicine Provider Triage Evaluation Note  Eric [name]. , a 87 y.o. male  was evaluated in triage.  Pt complains of of the abdominal pain.  States that it has been achy since last night states that he has had bright red blood in stool multiple episodes yesterday and today denies any lightheadedness or dizziness chest pain or shortness of breath.  Denies any nausea or vomiting  Review of Systems  Positive: Abdominal pain, BRBPR Negative: Fever  Physical Exam  BP 128/61 (BP Location: Right Arm)   Pulse 68   Temp 98.5 F (36.9 C) (Oral)   Resp 18   SpO2 99%  Gen:   Awake, no distress   Resp:  Normal effort  MSK:   Moves extremities without difficulty  Other:  Abdomen is tender on the right lower quadrant left lower quadrant and some suprapubically.  Medical Decision Making  Medically screening exam initiated at 7:24 PM.  Appropriate orders placed.  Eric [name]. was informed that the remainder of the evaluation will be completed by another provider, this initial triage assessment does not replace that evaluation, and the importance of remaining in the ED until their evaluation is complete.  I reviewed patient's CT abdomen pelvis with contrast from 06/15/2019 which showed rectosigmoid diverticulosis.  I do have some concern that his BRBPR is from this today.  He is afebrile but did take some Tylenol earlier.   Eric [name], [place] [id number]    Eric Cure, DO [id number]

## 2021-04-27 NOTE — ED Triage Notes (Signed)
Pt reports that he has been having bright red rectal bleeding since last night with some clots, also having lower abdominal pain, pt is on Eliquis

## 2021-04-27 NOTE — H&P (Signed)
History and Physical    Eric [name]. KQ:[id number] DOB: 03/14/1928 DOA: 04/27/2021  PCP: [name], MD Patient coming from: Home  Chief Complaint: Rectal bleeding  HPI: Eric [name]. is a 87 y.o. male with medical history significant of diverticulosis, permanent Afib on Eliquis, CAD status post CABG, CKD stage III, diet-controlled type 2 diabetes, OSA, PAD status post left BKA, hypertension, hyperlipidemia, BPH presented to the ED for evaluation of multiple episodes of bright red blood per rectum x3 days.  Hemodynamically stable.  Labs notable for hemoglobin 9.6, was 11.0 on 12/31/2020.  WBC 12.6.  Potassium borderline elevated at 5.3.  Bicarb 20, anion gap 9.  BUN 40, creatinine 2.1 (baseline 1.1).  No significant elevation of LFTs.  Lipase normal.  Lactic acid normal. Patient was given IV Protonix 80 mg.  ED physician discussed the case with on-call physician for [facility] gastroenterology, planning on colonoscopy tomorrow.  He recommended holding off on CTA at this time given AKI on CKD.  Patient reports 3-day history of several episodes of bright red blood per rectum with dark clots mixed in.  No hematemesis.  Today he experienced some mild bilateral lower quadrant abdominal discomfort.  Last dose of Eliquis was 8 AM today.  Denies lightheadedness/dizziness, fatigue, shortness of breath, or chest pain.  No other complaints.  Review of Systems:  All systems reviewed and apart from history of presenting illness, are negative.  Past Medical History:  Diagnosis Date   Atrial flutter ([place])    s/p RFCA   CAD (coronary artery disease)    cath 2003, occluded S-RCA, occluded S-Dx, L-LAD ok, s/p PTCA to LAD   Cataract    CKD (chronic kidney disease) stage 3, GFR 30-59 ml/min (HCC)    Diabetes mellitus    diet controlled   Hearing aid worn    bilateral   History of kidney stones    Long term (current) use of anticoagulants    Neuromuscular disorder (HCC)    OSA (obstructive  sleep apnea) 12/11   very mild, AHI 7/hr   Persistent atrial fibrillation ([place]) 09/26/2015   Pure hyperglyceridemia    PVD (peripheral vascular disease) ([place])    angioplasty of his right lower extremity in [place] by Dr.[name] 2013   Unspecified essential hypertension    Wears dentures    full upper and lower    Past Surgical History:  Procedure Laterality Date   ABDOMINAL AORTOGRAM W/LOWER EXTREMITY N/A 11/15/2017   Procedure: ABDOMINAL AORTOGRAM W/LOWER EXTREMITY;  Surgeon: [name], MD;  Location: [place] [facility];  Service: Cardiovascular;  Laterality: N/A;   Adenosine Myoview  3/06   EF 56%, neg. Ischemia   Adenosine Myoview  02/18/07   nml   AMPUTATION Left 12/15/2017   Procedure: AMPUTATION BELOW KNEE;  Surgeon: Algernon [name], MD;  Location: [facility];  Service: Vascular;  Laterality: Left;   ANGIOPLASTY  1/99   CAD- diogonal with rotational artherectomy   Arthrectomy     of LAD & PTCA   BLEPHAROPLASTY Bilateral    CARDIAC CATHETERIZATION  1/00   CARDIOVERSION  1/04   CARDIOVERSION  5/07   hospital- a flutter   CARPAL TUNNEL RELEASE     ? bilateral   CATARACT EXTRACTION W/PHACO Right 07/28/2017   Procedure: CATARACT EXTRACTION PHACO AND INTRAOCULAR LENS PLACEMENT ([place]) RIGHT DIABETIC;  Surgeon: [name], MD;  Location: [place];  Service: Ophthalmology;  Laterality: Right;  Diabetic - diet controlled   CATARACT  EXTRACTION W/PHACO Left 08/18/2017   Procedure: CATARACT EXTRACTION PHACO AND INTRAOCULAR LENS PLACEMENT ([facility]);  Surgeon: [name], MD;  Location: [place];  Service: Ophthalmology;  Laterality: Left;  DIABETES - oral meds   COLONOSCOPY W/ BIOPSIES  10/01/06   sigmoid polyp bx neg, 3 years   CORONARY ANGIOPLASTY  4/03   cutting balloon PTCA pLAD into Diag   CORONARY ARTERY BYPASS GRAFT  2000   LIMA-LAD, SVG-RCA, SVG-Diag; SVG-Diag & SVG-RCA occluded 2003   FRACTURE SURGERY     HAND SURGERY  08/27/09   R thumb  procedure wit Scaphoid Gragt and screws, Dr [name]   LOWER EXTREMITY ANGIOGRAPHY Right 01/25/2019   Procedure: LOWER EXTREMITY ANGIOGRAPHY;  Surgeon: Katha Cabal, MD;  Location: [place] [facility];  Service: Cardiovascular;  Laterality: Right;   NM MYOVIEW LTD  4/11   normal   PERIPHERAL VASCULAR CATHETERIZATION Right 06/27/2015   Procedure: Lower Extremity Angiography;  Surgeon: Algernon [name], MD;  Location: [place] [facility];  Service: Cardiovascular;  Laterality: Right;   PERIPHERAL VASCULAR CATHETERIZATION  06/27/2015   Procedure: Lower Extremity Intervention;  Surgeon: Algernon [name], MD;  Location: [place] [facility];  Service: Cardiovascular;;     reports that he has never smoked. He quit smokeless tobacco use about 28 years ago. He reports that he does not drink alcohol and does not use drugs.  Allergies  Allergen Reactions   Isosorbide Mononitrate Other (See Comments)    Unknown, doesn't remember    Ramipril Cough and Other (See Comments)   Quinidine Gluconate Rash   Quinidine Gluconate Other (See Comments) and Rash    Family History  Problem Relation Age of Onset   Stroke Father    Aneurysm Father    Hypertension Father    Prostate cancer Brother    Hypertension Mother    Lung cancer Brother        smoker   Thrombosis Brother    Other Brother        RF valve disorder (smoker)   Lung cancer Brother        smoker   Breast cancer Sister    Liver cancer Brother    Other Sister        cerebral hemorrhage    Prior to Admission medications   Medication Sig Start Date End Date Taking? Authorizing Provider  acetaminophen (TYLENOL) 325 MG tablet Take 2 tablets (650 mg total) by mouth every 6 (six) hours as needed for mild pain (or temp >/= 101 F). 12/21/17  Yes Wieting, Richard, MD  amLODipine (NORVASC) 10 MG tablet Take 1 tablet (10 mg total) by mouth daily. 06/14/20  Yes [name], MD  atenolol (TENORMIN) 25 MG tablet Take 0.5 tablets (12.5 mg total) by  mouth daily. Patient taking differently: Take 12.5 mg by mouth at bedtime. 04/25/20  Yes Fay Records, MD  B Complex Vitamins (B COMPLEX-B12) TABS Take 1 tablet by mouth daily.    Yes [provider]  cholecalciferol (VITAMIN D) 1000 UNITS tablet Take 1,000 Units by mouth 2 (two) times a week. Takes on Mon and Fri   Yes [provider]  Cinnamon 500 MG capsule Take 1,000 mg by mouth 2 (two) times daily.   Yes [provider]  ELIQUIS 5 MG TABS tablet Take 1 tablet by mouth twice daily Patient taking differently: Take 5 mg by mouth 2 (two) times daily. 11/15/20  Yes [name], MD  ferrous sulfate 325 (  65 FE) MG EC tablet Take 325 mg by mouth at bedtime.   Yes [provider]  finasteride (PROSCAR) 5 MG tablet TAKE 1 TABLET BY MOUTH ONCE DAILY Patient taking differently: Take 5 mg by mouth daily. 03/28/18  Yes [name], MD  fish oil-omega-3 fatty acids 1000 MG capsule Take 1,000 mg by mouth in the morning and at bedtime.   Yes [provider]  furosemide (LASIX) 40 MG tablet TAKE 1 TABLET BY MOUTH ONCE DAILY AS NEEDED FOR  LEG  SWELLING Patient taking differently: Take 40 mg by mouth daily as needed for fluid or edema. 04/11/21  Yes [name], MD  hydrALAZINE (APRESOLINE) 25 MG tablet TAKE 3 TABLETS BY MOUTH EVERY 8 HOURS (NEEDS  OFFICE  VISIT  BEFORE  ANY  FURTHER  REFILLS) Patient taking differently: Take 75 mg by mouth every 8 (eight) hours. 04/10/21  Yes [name], MD  lisinopril (ZESTRIL) 40 MG tablet Take 1 tablet by mouth once daily Patient taking differently: Take 40 mg by mouth daily. 02/06/21  Yes [name], MD  Multiple Vitamin (DAILY MULTIVITAMIN PO) Take 1 tablet by mouth daily.   Yes [provider]  ONETOUCH ULTRA test strip USE 1 STRIP TO CHECK GLUCOSE ONCE DAILY 07/03/20  Yes [name], MD  pentoxifylline (TRENTAL) 400 MG CR tablet TAKE 1 TABLET BY MOUTH EVERY 12 HOURS Patient taking  differently: Take 400 mg by mouth in the morning and at bedtime. 04/25/21  Yes [name], MD  rosuvastatin (CRESTOR) 40 MG tablet TAKE 1 TABLET BY MOUTH ONCE DAILY (CALL  CLINIC  TO  SCHEDULE  AN  APPOINTMENT  FOR  FUTURE  REFILLS) Patient taking differently: Take 40 mg by mouth daily. 07/23/20  Yes [name], MD  saccharomyces boulardii (FLORASTOR) 250 MG capsule Take 1 capsule (250 mg total) by mouth daily. 06/10/18  Yes [name], MD  tamsulosin (FLOMAX) 0.4 MG CAPS capsule Take 1 capsule by mouth once daily Patient taking differently: Take 0.4 mg by mouth daily. 04/25/21  Yes [name], MD    Physical Exam: Vitals:   04/27/21 1923 04/27/21 2120  BP: 128/61 (!) 140/47  Pulse: 68 70  Resp: 18 16  Temp: 98.5 F (36.9 C)   TempSrc: Oral   SpO2: 99% 98%    Physical Exam Constitutional:      General: He is not in acute distress. HENT:     Head: Normocephalic and atraumatic.  Eyes:     Extraocular Movements: Extraocular movements intact.     Conjunctiva/sclera: Conjunctivae normal.  Cardiovascular:     Rate and Rhythm: Normal rate and regular rhythm.     Pulses: Normal pulses.  Pulmonary:     Effort: Pulmonary effort is normal. No respiratory distress.     Breath sounds: Normal breath sounds. No wheezing.  Abdominal:     General: Bowel sounds are normal. There is no distension.     Palpations: Abdomen is soft.     Tenderness: There is abdominal tenderness. There is no guarding or rebound.     Comments: Mild bilateral lower quadrant tenderness  Musculoskeletal:        General: No swelling or tenderness.     Cervical back: Normal range of motion and neck supple.  Skin:    General: Skin is warm and dry.  Neurological:     General: No focal deficit present.     Mental Status: He is alert  and oriented to person, place, and time.     Labs on Admission: I have personally reviewed following labs and imaging studies  CBC: Recent Labs  Lab  04/27/21 1932  WBC 12.6*  NEUTROABS 8.0*  HGB 9.6*  HCT 30.1*  MCV 96.2  PLT 0000000   Basic Metabolic Panel: Recent Labs  Lab 04/27/21 1932  NA 134*  K 5.3*  CL 105  CO2 20*  GLUCOSE 130*  BUN 40*  CREATININE 2.12*  CALCIUM 9.1   GFR: CrCl cannot be calculated (Unknown ideal weight.). Liver Function Tests: Recent Labs  Lab 04/27/21 1932  AST 44*  ALT 37  ALKPHOS 52  BILITOT 0.5  PROT 6.3*  ALBUMIN 2.8*   Recent Labs  Lab 04/27/21 1932  LIPASE 25   No results for input(s): AMMONIA in the last 168 hours. Coagulation Profile: Recent Labs  Lab 04/27/21 1932  INR 1.7*   Cardiac Enzymes: No results for input(s): CKTOTAL, CKMB, CKMBINDEX, TROPONINI in the last 168 hours. BNP (last 3 results) Recent Labs    12/09/20 1140 12/30/20 1159 02/03/21 0920  PROBNP 1,983* 2,570* 2,204*   HbA1C: No results for input(s): HGBA1C in the last 72 hours. CBG: No results for input(s): GLUCAP in the last 168 hours. Lipid Profile: No results for input(s): CHOL, HDL, LDLCALC, TRIG, CHOLHDL, LDLDIRECT in the last 72 hours. Thyroid Function Tests: No results for input(s): TSH, T4TOTAL, FREET4, T3FREE, THYROIDAB in the last 72 hours. Anemia Panel: No results for input(s): VITAMINB12, FOLATE, FERRITIN, TIBC, IRON, RETICCTPCT in the last 72 hours. Urine analysis:    Component Value Date/Time   COLORURINE BROWN (A) 07/24/2020 1006   APPEARANCEUR Cloudy (A) 03/12/2021 1147   LABSPEC 1.015 07/24/2020 1006   PHURINE 5.5 07/24/2020 1006   GLUCOSEU Negative 03/12/2021 1147   HGBUR 3+ (A) 07/24/2020 1006   HGBUR trace-lysed 02/07/2008 1101   BILIRUBINUR Negative 03/12/2021 1147   KETONESUR NEGATIVE 07/24/2020 1006   PROTEINUR 1+ (A) 03/12/2021 1147   PROTEINUR 3+ (A) 07/24/2020 1006   UROBILINOGEN 0.2 02/07/2008 1101   NITRITE Positive (A) 03/12/2021 1147   NITRITE NEGATIVE 07/24/2020 1006   LEUKOCYTESUR 3+ (A) 03/12/2021 1147   LEUKOCYTESUR 1+ (A) 07/24/2020 1006     Radiological Exams on Admission: No results found.  EKG: EKG ordered and currently pending.  Assessment/Plan Principal Problem:   GI bleed Active Problems:   Essential hypertension   Benign prostatic hyperplasia   Acute blood loss anemia   Acute kidney injury superimposed on CKD (HCC)   Acute lower GI bleed Acute blood loss anemia Patient presenting with multiple episodes of complaint of bright red blood per rectum x3 days.  Endorsing mild bilateral lower quadrant abdominal discomfort.  Hemodynamically stable.  Hemoglobin was 11.0 on 12/31/2020 and now down to 9.6.  Moderate diverticulosis seen throughout the colon on colonoscopy done in 2011.  He is on Eliquis for A. fib, last dose at 8 AM today.  Patient was given IV Protonix 80 mg in the ED.   -Keep NPO.  GI planning on colonoscopy tomorrow, no CTA at this time given AKI on CKD.  IV fluid hydration.  Type and screen, serial CBCs.  Transfuse PRBCs if Hgb <8 (patient has given verbal consent).  Hold home Eliquis.  AKI on CKD stage III Likely prerenal in the setting of acute GI bleed.  BUN 40, creatinine 2.1 (baseline 1.1). -IV fluid hydration.  Monitor renal function and urine output.  Avoid nephrotoxic agents/contrast.  Hold home Lasix  and lisinopril.  Mild normal anion gap metabolic acidosis Likely due to home diuretic use and AKI. -IV fluid hydration, continue to monitor  Mild leukocytosis Likely reactive.  No fever or signs of sepsis. -Continue to monitor CBC  Borderline hyperkalemia -EKG ordered.  Hold ACE inhibitor and repeat labs in a.m.  Permanent A. Fib -Cardiac monitoring, EKG ordered.  Hold atenolol at this time to avoid hypotension.  Also hold Eliquis in the setting of acute GI bleed.  CAD status post CABG -Not on antiplatelet therapy.  Hold beta-blocker at this time to avoid hypotension.  Continue statin.  Diet-controlled type 2 diabetes A1c 5.7 on 12/31/2020.  Hypertension -Hold antihypertensives at this  time in the setting of acute GI bleed.  Hyperlipidemia -Continue crestor  BPH -Continue finasteride and tamsulosin  DVT prophylaxis: SCDs Code Status: Patient wishes to be full code. Family Communication: No family available at this time. Disposition Plan: Status is: Inpatient  Remains inpatient appropriate because:Inpatient level of care appropriate due to severity of illness  Dispo: The patient is from: Home              Anticipated d/c is to: Home              Patient currently is not medically stable to d/c.   Difficult to place patient No  Level of care: Level of care: Telemetry Medical  The medical decision making on this patient was of high complexity and the patient is at high risk for clinical deterioration, therefore this is a level 3 visit.  Shela Leff MD [facility]  If 7PM-7AM, please contact night-coverage [email or web address]  04/27/2021, 10:59 PM

## 2021-04-28 ENCOUNTER — Encounter (HOSPITAL_COMMUNITY): Payer: Self-pay | Admitting: Internal Medicine

## 2021-04-28 ENCOUNTER — Inpatient Hospital Stay (HOSPITAL_COMMUNITY): Payer: PPO | Admitting: Anesthesiology

## 2021-04-28 ENCOUNTER — Encounter (HOSPITAL_COMMUNITY)
Admission: EM | Disposition: A | Discharge status: Home / Self Care | Payer: Self-pay | Source: Home / Self Care | Attending: Internal Medicine

## 2021-04-28 DIAGNOSIS — N179 Acute kidney failure, unspecified: Secondary | ICD-10-CM

## 2021-04-28 DIAGNOSIS — N189 Chronic kidney disease, unspecified: Secondary | ICD-10-CM

## 2021-04-28 DIAGNOSIS — K2960 Other gastritis without bleeding: Secondary | ICD-10-CM | POA: Insufficient documentation

## 2021-04-28 DIAGNOSIS — N401 Enlarged prostate with lower urinary tract symptoms: Secondary | ICD-10-CM

## 2021-04-28 DIAGNOSIS — D124 Benign neoplasm of descending colon: Secondary | ICD-10-CM

## 2021-04-28 DIAGNOSIS — D123 Benign neoplasm of transverse colon: Secondary | ICD-10-CM

## 2021-04-28 DIAGNOSIS — K2901 Acute gastritis with bleeding: Secondary | ICD-10-CM

## 2021-04-28 DIAGNOSIS — D62 Acute posthemorrhagic anemia: Secondary | ICD-10-CM

## 2021-04-28 DIAGNOSIS — N138 Other obstructive and reflux uropathy: Secondary | ICD-10-CM

## 2021-04-28 HISTORY — PX: POLYPECTOMY: SHX5525

## 2021-04-28 HISTORY — PX: COLONOSCOPY WITH PROPOFOL: SHX5780

## 2021-04-28 LAB — CBC
HCT: 26.7 % — ABNORMAL LOW (ref 39.0–52.0)
HCT: 26.4 % — ABNORMAL LOW (ref 39.0–52.0)
Hemoglobin: 8.5 g/dL — ABNORMAL LOW (ref 13.0–17.0)
Hemoglobin: 8.3 g/dL — ABNORMAL LOW (ref 13.0–17.0)
MCH: 30.6 pg (ref 26.0–34.0)
MCH: 29.9 pg (ref 26.0–34.0)
MCHC: 31.8 g/dL (ref 30.0–36.0)
MCHC: 31.4 g/dL (ref 30.0–36.0)
MCV: 96 fL (ref 80.0–100.0)
MCV: 95 fL (ref 80.0–100.0)
Platelets: 161 10*3/uL (ref 150–400)
Platelets: 164 10*3/uL (ref 150–400)
RBC: 2.78 MIL/uL — ABNORMAL LOW (ref 4.22–5.81)
RBC: 2.78 MIL/uL — ABNORMAL LOW (ref 4.22–5.81)
RDW: 15.3 % (ref 11.5–15.5)
RDW: 15.3 % (ref 11.5–15.5)
WBC: 11.1 10*3/uL — ABNORMAL HIGH (ref 4.0–10.5)
WBC: 9.5 10*3/uL (ref 4.0–10.5)
nRBC: 0 % (ref 0.0–0.2)
nRBC: 0 % (ref 0.0–0.2)

## 2021-04-28 LAB — BASIC METABOLIC PANEL
Anion gap: 9 (ref 5–15)
BUN: 39 mg/dL — ABNORMAL HIGH (ref 8–23)
CO2: 18 mmol/L — ABNORMAL LOW (ref 22–32)
Calcium: 8.7 mg/dL — ABNORMAL LOW (ref 8.9–10.3)
Chloride: 108 mmol/L (ref 98–111)
Creatinine, Ser: 1.81 mg/dL — ABNORMAL HIGH (ref 0.61–1.24)
GFR, Estimated: 35 mL/min — ABNORMAL LOW (ref >60)
Glucose, Bld: 98 mg/dL (ref 70–99)
Potassium: 4.7 mmol/L (ref 3.5–5.1)
Sodium: 135 mmol/L (ref 135–145)

## 2021-04-28 LAB — HEMOGLOBIN AND HEMATOCRIT, BLOOD
HCT: 27 % — ABNORMAL LOW (ref 39.0–52.0)
Hemoglobin: 8.8 g/dL — ABNORMAL LOW (ref 13.0–17.0)

## 2021-04-28 LAB — SARS CORONAVIRUS 2 (TAT 6-24 HRS): SARS Coronavirus 2: NEGATIVE

## 2021-04-28 LAB — LACTIC ACID, PLASMA: Lactic Acid, Venous: 0.8 mmol/L (ref 0.5–1.9)

## 2021-04-28 SURGERY — COLONOSCOPY WITH PROPOFOL
Anesthesia: Monitor Anesthesia Care

## 2021-04-28 MED ORDER — PHENYLEPHRINE 40 MCG/ML (10ML) SYRINGE FOR IV PUSH (FOR BLOOD PRESSURE SUPPORT)
PREFILLED_SYRINGE | INTRAVENOUS | Status: DC | PRN
  Administered 2021-04-28 (×3): 80 ug via INTRAVENOUS

## 2021-04-28 MED ORDER — EPHEDRINE SULFATE-NACL 50-0.9 MG/10ML-% IV SOSY
PREFILLED_SYRINGE | INTRAVENOUS | Status: DC | PRN
  Administered 2021-04-28 (×3): 10 mg via INTRAVENOUS

## 2021-04-28 MED ORDER — SODIUM CHLORIDE 0.9 % IV SOLN: INTRAVENOUS | Status: AC

## 2021-04-28 MED ORDER — PROPOFOL 500 MG/50ML IV EMUL
INTRAVENOUS | Status: DC | PRN
  Administered 2021-04-28: 75 ug/kg/min via INTRAVENOUS

## 2021-04-28 MED ORDER — LIDOCAINE 2% (20 MG/ML) 5 ML SYRINGE
INTRAMUSCULAR | Status: DC | PRN
  Administered 2021-04-28: 50 mg via INTRAVENOUS

## 2021-04-28 MED ORDER — PROPOFOL 10 MG/ML IV BOLUS
INTRAVENOUS | Status: DC | PRN
  Administered 2021-04-28: 50 mg via INTRAVENOUS

## 2021-04-28 SURGICAL SUPPLY — 22 items

## 2021-04-28 NOTE — Progress Notes (Addendum)
PROGRESS NOTE    Eric [name].  KQ:[id number] DOB: 06-20-1928 DOA: 04/27/2021 PCP: [name], MD  Brief Narrative:Eric [name]. is a 87 y.o. male with medical history significant of diverticulosis, permanent Afib on Eliquis, CAD status post CABG, CKD stage III, diet-controlled type 2 diabetes, OSA, PAD status post left BKA, hypertension, hyperlipidemia, BPH presented to the ED for evaluation of multiple episodes of bright red blood per rectum x3 days associated with moderate lower abdominal pain. -Hemoglobin 9.6 on admission from baseline around 11, WBC was 12.6, creatinine 2.1 up from baseline of 1.1, EDP discussed with gastroenterology, plan for colonoscopy, CT Abd deferred in the setting of AKI last night  Assessment & Plan:   Hematochezia Acute blood loss anemia -Continues to have ongoing lower GI bleed for 3 days and several episodes in the ED overnight -Hemoglobin down to 8.5, baseline around 11, continue to trend -Clinically suspect ischemic colitis -history of moderate abdominal pain, mild tenderness on exam, diverticular bleeding is also possibility -Eliquis on hold, gastroenterology consulting, started colonoscopy prep overnight   AKI  Metabolic acidosis Hyperkalemia -Likely prerenal in the setting of acute GI bleed.  BUN 40, creatinine 2.1 (baseline 1.1). -Continue IV fluids today, hold Lasix and lisinopril -Monitor urine output, kidney function   Permanent A. Fib -Atenolol on hold, resume tomorrow, Eliquis also on hold    CAD status post CABG -Continue statin, atenolol on hold as above   Diet-controlled type 2 diabetes A1c 5.7 on 12/31/2020.   Hypertension -Hold antihypertensives at this time in the setting of acute GI bleed.   Hyperlipidemia -Continue crestor   BPH -Continue finasteride and tamsulosin  Peripheral vascular disease Left BKA   DVT prophylaxis: SCDs Code Status: full code per pts wishes Family Communication: No family at  bedside, will update spouse Disposition Plan: Status is: Inpatient  Remains inpatient appropriate because:Inpatient level of care appropriate due to severity of illness  Dispo: The patient is from: Home              Anticipated d/c is to: Home              Patient currently is not medically stable to d/c.   Difficult to place patient No   Consultants:  Gastroenterology  Procedures:   Antimicrobials:    Subjective: -Continues to have lower GI bleeding, multiple episodes overnight and this morning  Objective: Vitals:   04/28/21 0335 04/28/21 0430 04/28/21 0600 04/28/21 0930  BP: 133/62 138/74 123/64 119/61  Pulse: 64 76 66 75  Resp: '15 14 17 17  '$ Temp:      TempSrc:      SpO2: 98% 99% 100% 100%    Intake/Output Summary (Last 24 hours) at 04/28/2021 1138 Last data filed at 04/27/2021 2144 Gross per 24 hour  Intake 100 ml  Output --  Net 100 ml   There were no vitals filed for this visit.  Examination:  General exam: Pleasant elderly male, laying in bed, hard of hearing, awake alert oriented x3, no distress HEENT: No JVD CVS: S1-S2, regular rate rhythm Lungs: Decreased breath sounds the bases otherwise clear Abdomen: Soft, mildly distended, mild lower abdominal tenderness, no rigidity or rebound, bowel sounds present but decreased Extremities: No edema, left BKA Skin: No rashes Psychiatry: Mood & affect appropriate.     Data Reviewed:   CBC: Recent Labs  Lab 04/27/21 1932 04/27/21 2327 04/28/21 0117 04/28/21 0424  WBC 12.6* 12.0*  --  11.1*  NEUTROABS 8.0*  --   --   --  HGB 9.6* 9.0* 8.8* 8.5*  HCT 30.1* 27.7* 27.0* 26.7*  MCV 96.2 94.9  --  96.0  PLT 178 150  --  [id number]   Basic Metabolic Panel: Recent Labs  Lab 04/27/21 1932 04/28/21 0424  NA 134* 135  K 5.3* 4.7  CL 105 108  CO2 20* 18*  GLUCOSE 130* 98  BUN 40* 39*  CREATININE 2.12* 1.81*  CALCIUM 9.1 8.7*   GFR: CrCl cannot be calculated (Unknown ideal weight.). Liver Function  Tests: Recent Labs  Lab 04/27/21 1932  AST 44*  ALT 37  ALKPHOS 52  BILITOT 0.5  PROT 6.3*  ALBUMIN 2.8*   Recent Labs  Lab 04/27/21 1932  LIPASE 25   No results for input(s): AMMONIA in the last 168 hours. Coagulation Profile: Recent Labs  Lab 04/27/21 1932  INR 1.7*   Cardiac Enzymes: No results for input(s): CKTOTAL, CKMB, CKMBINDEX, TROPONINI in the last 168 hours. BNP (last 3 results) Recent Labs    12/09/20 1140 12/30/20 1159 02/03/21 0920  PROBNP 1,983* 2,570* 2,204*   HbA1C: No results for input(s): HGBA1C in the last 72 hours. CBG: No results for input(s): GLUCAP in the last 168 hours. Lipid Profile: No results for input(s): CHOL, HDL, LDLCALC, TRIG, CHOLHDL, LDLDIRECT in the last 72 hours. Thyroid Function Tests: No results for input(s): TSH, T4TOTAL, FREET4, T3FREE, THYROIDAB in the last 72 hours. Anemia Panel: No results for input(s): VITAMINB12, FOLATE, FERRITIN, TIBC, IRON, RETICCTPCT in the last 72 hours. Urine analysis:    Component Value Date/Time   COLORURINE BROWN (A) 07/24/2020 1006   APPEARANCEUR Cloudy (A) 03/12/2021 1147   LABSPEC 1.015 07/24/2020 1006   PHURINE 5.5 07/24/2020 1006   GLUCOSEU Negative 03/12/2021 1147   HGBUR 3+ (A) 07/24/2020 1006   HGBUR trace-lysed 02/07/2008 1101   BILIRUBINUR Negative 03/12/2021 1147   KETONESUR NEGATIVE 07/24/2020 1006   PROTEINUR 1+ (A) 03/12/2021 1147   PROTEINUR 3+ (A) 07/24/2020 1006   UROBILINOGEN 0.2 02/07/2008 1101   NITRITE Positive (A) 03/12/2021 1147   NITRITE NEGATIVE 07/24/2020 1006   LEUKOCYTESUR 3+ (A) 03/12/2021 1147   LEUKOCYTESUR 1+ (A) 07/24/2020 1006   Sepsis Labs: '@LABRCNTIP'$ (procalcitonin:4,lacticidven:4)  ) Recent Results (from the past 240 hour(s))  SARS CORONAVIRUS 2 (TAT 6-24 HRS) Nasopharyngeal Nasopharyngeal Swab     Status: None   Collection Time: 04/27/21 10:32 PM   Specimen: Nasopharyngeal Swab  Result Value Ref Range Status   SARS Coronavirus 2 NEGATIVE  NEGATIVE Final    Comment: (NOTE) SARS-CoV-2 target nucleic acids are NOT DETECTED.  The SARS-CoV-2 RNA is generally detectable in upper and lower respiratory specimens during the acute phase of infection. Negative results do not preclude SARS-CoV-2 infection, do not rule out co-infections with other pathogens, and should not be used as the sole basis for treatment or other patient management decisions. Negative results must be combined with clinical observations, patient history, and epidemiological information. The expected result is Negative.  Fact Sheet for Patients: SugarRoll.be  Fact Sheet for Healthcare Providers: https://www.woods-mathews.com/  This test is not yet approved or cleared by the [place] FDA and  has been authorized for detection and/or diagnosis of SARS-CoV-2 by FDA under an Emergency Use Authorization (EUA). This EUA will remain  in effect (meaning this test can be used) for the duration of the COVID-19 declaration under Se ction 564(b)(1) of the Act, 21 U.S.C. section 360bbb-3(b)(1), unless the authorization is terminated or revoked sooner.  Performed at [facility], [place]  [address], [place], [place] [address]     Scheduled Meds:  finasteride  5 mg Oral Daily   rosuvastatin  40 mg Oral Daily   tamsulosin  0.4 mg Oral Daily   Continuous Infusions:   LOS: 1 day    Time spent: 58mn    PDomenic Polite MD [facility]   04/28/2021, 11:38 AM

## 2021-04-28 NOTE — Anesthesia Preprocedure Evaluation (Signed)
Anesthesia Evaluation  Patient identified by MRN, date of birth, ID band Patient awake    Reviewed: Allergy & Precautions, NPO status , Patient's Chart, lab work & pertinent test results  Airway Mallampati: II  TM Distance: >3 FB Neck ROM: Full    Dental   Pulmonary sleep apnea ,    breath sounds clear to auscultation       Cardiovascular hypertension, Pt. on medications and Pt. on home beta blockers + CAD, + CABG and + Peripheral Vascular Disease  + dysrhythmias Atrial Fibrillation  Rhythm:Irregular Rate:Normal     Neuro/Psych  Neuromuscular disease    GI/Hepatic Neg liver ROS, PUD, GI bleed   Endo/Other  diabetes, Type 2, Oral Hypoglycemic Agents  Renal/GU Renal disease     Musculoskeletal   Abdominal   Peds  Hematology  (+) anemia ,   Anesthesia Other Findings   Reproductive/Obstetrics                             Anesthesia Physical Anesthesia Plan  ASA: 3  Anesthesia Plan: MAC   Post-op Pain Management:    Induction:   PONV Risk Score and Plan: 1 and Ondansetron, Propofol infusion and Treatment may vary due to age or medical condition  Airway Management Planned: Natural Airway and Simple Face Mask  Additional Equipment:   Intra-op Plan:   Post-operative Plan:   Informed Consent: I have reviewed the patients History and Physical, chart, labs and discussed the procedure including the risks, benefits and alternatives for the proposed anesthesia with the patient or authorized representative who has indicated his/her understanding and acceptance.       Plan Discussed with: CRNA  Anesthesia Plan Comments:         Anesthesia Quick Evaluation

## 2021-04-28 NOTE — ED Notes (Signed)
Admitting at bedside 

## 2021-04-28 NOTE — Op Note (Signed)
[facility] Patient Name: Eric [name] Procedure Date : 04/28/2021 MRN: [id number] Attending MD: Gatha Mayer , MD Date of Birth: 06-07-1928 CSN: [id number] Age: 87 Admit Type: Inpatient Procedure:                Colonoscopy Indications:              Hematochezia Providers:                Gatha Mayer, MD, Mariana Arn, Faustina                            Mbumina, Technician Referring MD:              Medicines:                Propofol per Anesthesia, Monitored Anesthesia Care Complications:            No immediate complications. Estimated Blood Loss:     Estimated blood loss was minimal. Procedure:                Pre-Anesthesia Assessment:                           - Prior to the procedure, a History and Physical                            was performed, and patient medications and                            allergies were reviewed. The patient's tolerance of                            previous anesthesia was also reviewed. The risks                            and benefits of the procedure and the sedation                            options and risks were discussed with the patient.                            All questions were answered, and informed consent                            was obtained. Prior Anticoagulants: The patient                            last took Eliquis (apixaban) 1 day prior to the                            procedure. ASA Grade Assessment: III - A patient                            with severe systemic disease. After reviewing the  risks and benefits, the patient was deemed in                            satisfactory condition to undergo the procedure.                           After obtaining informed consent, the colonoscope                            was passed under direct vision. Throughout the                            procedure, the patient's blood pressure, pulse, and                            oxygen  saturations were monitored continuously. The                            [id number] Olympus colonoscope was                            introduced through the anus and advanced to the the                            cecum, identified by appendiceal orifice and                            ileocecal valve. The colonoscopy was performed                            without difficulty. The patient tolerated the                            procedure well. The quality of the bowel                            preparation was good. The ileocecal valve,                            appendiceal orifice, and rectum were photographed. Scope In: 12:55:32 PM Scope Out: 1:08:32 PM Scope Withdrawal Time: 0 hours 10 minutes 33 seconds  Total Procedure Duration: 0 hours 13 minutes 0 seconds  Findings:      The perianal and digital rectal examinations were normal.      Five sessile polyps were found in the descending colon and transverse       colon. The polyps were diminutive in size. These polyps were removed       with a cold snare. Resection and retrieval were complete. Verification       of patient identification for the specimen was done. Estimated blood       loss was minimal.      Many small and large-mouthed diverticula were found in the sigmoid       colon, descending colon, transverse colon and ascending colon. There was       no evidence of diverticular bleeding.  External and internal hemorrhoids were found. Impression:               - Five diminutive polyps in the descending colon                            and in the transverse colon, removed with a cold                            snare. Max size 5 mm. Resected and retrieved.                           - Severe diverticulosis in the sigmoid colon, in                            the descending colon, in the transverse colon and                            in the ascending colon. There was no evidence of                             diverticular bleeding now but i think he has had a                            diverticular bleed.                           - External and internal hemorrhoids. Not a bleeding                            source in my opinion Recommendation:           - Return patient to hospital ward for ongoing care.                           - Full liquid diet.                           - Continue to hold Eliquis - anticipate                            recommending leaving off x 1 week - if well                            tomorrow may be dc I think                           - No repeat colonoscopy due to age.                           - Await pathology results.                           I have updated son Claiborne Billings Procedure Code(s):        --- Professional ---  45385, Colonoscopy, flexible; with removal of                            tumor(s), polyp(s), or other lesion(s) by snare                            technique Diagnosis Code(s):        --- Professional ---                           K63.5, Polyp of colon                           K64.8, Other hemorrhoids                           K92.1, Melena (includes Hematochezia)                           K57.30, Diverticulosis of large intestine without                            perforation or abscess without bleeding CPT copyright 2019 American Medical Association. All rights reserved. The codes documented in this report are preliminary and upon coder review may  be revised to meet current compliance requirements. Gatha Mayer, MD 04/28/2021 1:24:13 PM This report has been signed electronically. Number of Addenda: 0

## 2021-04-28 NOTE — ED Notes (Signed)
Assisted pt in transferring from stretcher to [facility] where he had a large BM that was almost completely frank blood and clots. MD notified.

## 2021-04-28 NOTE — ED Notes (Signed)
Endo called for report, patient to be transferred around 12:30

## 2021-04-28 NOTE — Transfer of Care (Signed)
Immediate Anesthesia Transfer of Care Note  Patient: Eric [name].  Procedure(s) Performed: COLONOSCOPY WITH PROPOFOL POLYPECTOMY  Patient Location: PACU  Anesthesia Type:MAC  Level of Consciousness: awake and oriented  Airway & Oxygen Therapy: Patient Spontanous Breathing  Post-op Assessment: Report given to RN and Post -op Vital signs reviewed and stable  Post vital signs: Reviewed and stable  Last Vitals:  Vitals Value Taken Time  BP 103/36 04/28/21 1316  Temp 36.6 C 04/28/21 1315  Pulse 74 04/28/21 1323  Resp 22 04/28/21 1323  SpO2 98 % 04/28/21 1323  Vitals shown include unvalidated device data.  Last Pain:  Vitals:   04/28/21 1315  TempSrc: Oral  PainSc: 0-No pain         Complications: No notable events documented.

## 2021-04-28 NOTE — Consult Note (Addendum)
[facility] Consult: 8:48 AM 04/28/2021  LOS: 1 day    Referring Provider: Dr Broadus John  Primary Care Physician:  Eric Frizzle, MD Primary Gastroenterologist:  Dr. Owens Loffler Son Eric [name]:  [phone] cell   Reason for Consultation: Lower abdominal pain, hematochezia.   HPI: Eric [name]. is a 87 y.o. male.  PMH peripheral vascular disease.  Previous angioplasty, 12/2017 left BKA.  Atrial ablation/flutter, previous ablation, CAD, CABG 2000, angioplasty 1999.  CKD 3.  Hypertension.  Mild pulmonary hypertension.  Chronic Eliquis, last dose 9/18/220 in AM.    09/2006 colonoscopy.  For history colon polyps.  3 polyps (path: Adenomatous without HGD) removed size 2 to 4 mm.  Pandiverticulosis. 01/2010 colonoscopy.  Pandiverticulosis.  4 sessile polyps (TA wo HGD), 3 to 6 mm in size removed.  Patient generally healthy, lives independently with his wife in [place]. Beginning late Friday he noticed blood mixed into his stools.  This persisted on Saturday and Sunday along with progressive bilateral but  R >> L lower abdominal pain.  No nausea, vomiting.  The only prodrome was 3 or 4 days prior to that when he felt very tired for a day but had no GI symptoms.  Denies starting any new medications.  Did take a couple of Tylenol yesterday which helped.  Presented yesterday afternoon to the ED.  Hgb 9.6 >> 8.5.  Was ~ 64 in May 2022.  MCV 96.  WBCs 12.6 >> 11.1.  Normal platelets.   AKI at 40/2.1, 39/1.8 on recheck.  LFTs mostly normal though AST is 44, same measure as in May 2022.  INR 1.7.  Patient does not smoke or drink.  Lives with his wife.    Past Medical History:  Diagnosis Date   Atrial flutter ([place])    s/p RFCA   CAD (coronary artery disease)    cath 2003, occluded S-RCA, occluded S-Dx,  L-LAD ok, s/p PTCA to LAD   Cataract    CKD (chronic kidney disease) stage 3, GFR 30-59 ml/min (HCC)    Diabetes mellitus    diet controlled   Hearing aid worn    bilateral   History of kidney stones    Long term (current) use of anticoagulants    Neuromuscular disorder (HCC)    OSA (obstructive sleep apnea) 12/11   very mild, AHI 7/hr   Persistent atrial fibrillation ([place]) 09/26/2015   Pure hyperglyceridemia    PVD (peripheral vascular disease) ([place])    angioplasty of his right lower extremity in [place] by Dr.[name] 2013   Unspecified essential hypertension    Wears dentures    full upper and lower    Past Surgical History:  Procedure Laterality Date   ABDOMINAL AORTOGRAM W/LOWER EXTREMITY N/A 11/15/2017   Procedure: ABDOMINAL AORTOGRAM W/LOWER EXTREMITY;  Surgeon: [name], MD;  Location: [facility];  Service: Cardiovascular;  Laterality: N/A;   Adenosine Myoview  3/06   EF 56%, neg. Ischemia   Adenosine Myoview  02/18/07   nml   AMPUTATION Left 12/15/2017  Procedure: AMPUTATION BELOW KNEE;  Surgeon: Algernon [name], MD;  Location: [facility];  Service: Vascular;  Laterality: Left;   ANGIOPLASTY  1/99   CAD- diogonal with rotational artherectomy   Arthrectomy     of LAD & PTCA   BLEPHAROPLASTY Bilateral    CARDIAC CATHETERIZATION  1/00   CARDIOVERSION  1/04   CARDIOVERSION  5/07   hospital- a flutter   CARPAL TUNNEL RELEASE     ? bilateral   CATARACT EXTRACTION W/PHACO Right 07/28/2017   Procedure: CATARACT EXTRACTION PHACO AND INTRAOCULAR LENS PLACEMENT ([place]) RIGHT DIABETIC;  Surgeon: Eric Koyanagi, MD;  Location: [facility];  Service: Ophthalmology;  Laterality: Right;  Diabetic - diet controlled   CATARACT EXTRACTION W/PHACO Left 08/18/2017   Procedure: CATARACT EXTRACTION PHACO AND INTRAOCULAR LENS PLACEMENT ([facility]);  Surgeon: Eric Koyanagi, MD;  Location: [facility];  Service: Ophthalmology;  Laterality: Left;  DIABETES - oral  meds   COLONOSCOPY W/ BIOPSIES  10/01/06   sigmoid polyp bx neg, 3 years   CORONARY ANGIOPLASTY  4/03   cutting balloon PTCA pLAD into Diag   CORONARY ARTERY BYPASS GRAFT  2000   LIMA-LAD, SVG-RCA, SVG-Diag; SVG-Diag & SVG-RCA occluded 2003   FRACTURE SURGERY     HAND SURGERY  08/27/09   R thumb procedure wit Scaphoid Gragt and screws, Dr Eric [name]   LOWER EXTREMITY ANGIOGRAPHY Right 01/25/2019   Procedure: LOWER EXTREMITY ANGIOGRAPHY;  Surgeon: Katha Cabal, MD;  Location: [facility];  Service: Cardiovascular;  Laterality: Right;   NM MYOVIEW LTD  4/11   normal   PERIPHERAL VASCULAR CATHETERIZATION Right 06/27/2015   Procedure: Lower Extremity Angiography;  Surgeon: Algernon [name], MD;  Location: [facility];  Service: Cardiovascular;  Laterality: Right;   PERIPHERAL VASCULAR CATHETERIZATION  06/27/2015   Procedure: Lower Extremity Intervention;  Surgeon: Algernon [name], MD;  Location: [facility];  Service: Cardiovascular;;    Prior to Admission medications   Medication Sig Start Date End Date Taking? Authorizing Provider  acetaminophen (TYLENOL) 325 MG tablet Take 2 tablets (650 mg total) by mouth every 6 (six) hours as needed for mild pain (or temp >/= 101 F). 12/21/17  Yes [name], Richard, MD  amLODipine (NORVASC) 10 MG tablet Take 1 tablet (10 mg total) by mouth daily. 06/14/20  Yes Eric Frizzle, MD  atenolol (TENORMIN) 25 MG tablet Take 0.5 tablets (12.5 mg total) by mouth daily. Patient taking differently: Take 12.5 mg by mouth at bedtime. 04/25/20  Yes Eric Records, MD  B Complex Vitamins (B COMPLEX-B12) TABS Take 1 tablet by mouth daily.    Yes [provider]  cholecalciferol (VITAMIN D) 1000 UNITS tablet Take 1,000 Units by mouth 2 (two) times a week. Takes on Mon and Fri   Yes [provider]  Cinnamon 500 MG capsule Take 1,000 mg by mouth 2 (two) times daily.   Yes [provider]  ELIQUIS 5 MG TABS tablet Take 1 tablet by  mouth twice daily Patient taking differently: Take 5 mg by mouth 2 (two) times daily. 11/15/20  Yes Eric Frizzle, MD  ferrous sulfate 325 (65 FE) MG EC tablet Take 325 mg by mouth at bedtime.   Yes [provider]  finasteride (PROSCAR) 5 MG tablet TAKE 1 TABLET BY MOUTH ONCE DAILY Patient taking differently: Take 5 mg by mouth daily. 03/28/18  Yes Eric Frizzle, MD  fish oil-omega-3 fatty acids 1000 MG capsule Take 1,000  mg by mouth in the morning and at bedtime.   Yes [provider]  furosemide (LASIX) 40 MG tablet TAKE 1 TABLET BY MOUTH ONCE DAILY AS NEEDED FOR  LEG  SWELLING Patient taking differently: Take 40 mg by mouth daily as needed for fluid or edema. 04/11/21  Yes Eric Frizzle, MD  hydrALAZINE (APRESOLINE) 25 MG tablet TAKE 3 TABLETS BY MOUTH EVERY 8 HOURS (NEEDS  OFFICE  VISIT  BEFORE  ANY  FURTHER  REFILLS) Patient taking differently: Take 75 mg by mouth every 8 (eight) hours. 04/10/21  Yes Eric Frizzle, MD  lisinopril (ZESTRIL) 40 MG tablet Take 1 tablet by mouth once daily Patient taking differently: Take 40 mg by mouth daily. 02/06/21  Yes Eric Frizzle, MD  Multiple Vitamin (DAILY MULTIVITAMIN PO) Take 1 tablet by mouth daily.   Yes [provider]  ONETOUCH ULTRA test strip USE 1 STRIP TO CHECK GLUCOSE ONCE DAILY 07/03/20  Yes Eric Frizzle, MD  pentoxifylline (TRENTAL) 400 MG CR tablet TAKE 1 TABLET BY MOUTH EVERY 12 HOURS Patient taking differently: Take 400 mg by mouth in the morning and at bedtime. 04/25/21  Yes Eric Frizzle, MD  rosuvastatin (CRESTOR) 40 MG tablet TAKE 1 TABLET BY MOUTH ONCE DAILY (CALL  CLINIC  TO  SCHEDULE  AN  APPOINTMENT  FOR  FUTURE  REFILLS) Patient taking differently: Take 40 mg by mouth daily. 07/23/20  Yes Eric Frizzle, MD  saccharomyces boulardii (FLORASTOR) 250 MG capsule Take 1 capsule (250 mg total) by mouth daily. 06/10/18  Yes Eric Frizzle, MD  tamsulosin (FLOMAX) 0.4 MG CAPS capsule  Take 1 capsule by mouth once daily Patient taking differently: Take 0.4 mg by mouth daily. 04/25/21  Yes Eric Frizzle, MD    Scheduled Meds:  finasteride  5 mg Oral Daily   rosuvastatin  40 mg Oral Daily   tamsulosin  0.4 mg Oral Daily   Infusions:  sodium chloride 125 mL/hr at 04/28/21 0001   PRN Meds: acetaminophen **OR** acetaminophen   Allergies as of 04/27/2021 - Review Complete 04/27/2021  Allergen Reaction Noted   Isosorbide mononitrate Other (See Comments) 12/15/2006   Ramipril Cough and Other (See Comments) 12/15/2006   Quinidine gluconate Rash 12/15/2006   Quinidine gluconate Other (See Comments) and Rash 12/15/2006    Family History  Problem Relation Age of Onset   Stroke Father    Aneurysm Father    Hypertension Father    Prostate cancer Brother    Hypertension Mother    Lung cancer Brother        smoker   Thrombosis Brother    Other Brother        RF valve disorder (smoker)   Lung cancer Brother        smoker   Breast cancer Sister    Liver cancer Brother    Other Sister        cerebral hemorrhage    Social History   Socioeconomic History   Marital status: Married    Spouse name: Not on file   Number of children: 5   Years of education: Not on file   Highest education level: Not on file  Occupational History   Occupation: [employer]    Employer: RETIRED  Tobacco Use   Smoking status: Never   Smokeless tobacco: Former    Quit date: 1994  Vaping Use   Vaping Use: Never used  Substance and Sexual Activity  Alcohol use: No   Drug use: No   Sexual activity: Never  Other Topics Concern   Not on file  Social History Narrative   Retired now does some antiques   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 ENT:  No nose bleeds.  HOH at baseline Pulm: Denies shortness of breath and cough. CV:  No  palpitations, no LE edema.  Denies angina GU:  No hematuria, no frequency GI: See HPI. Heme: Has seen purpura chronically but no excessive bleeding or bruising. Transfusions: None in past. Neuro:  No headaches, no peripheral tingling or numbness.  No dizziness, no syncope.  No seizures. Derm:  No itching, no rash or sores.  Endocrine:  No sweats or chills.  No polyuria or dysuria Immunization: Reviewed.   PHYSICAL EXAM: Vital signs in last 24 hours: Vitals:   04/28/21 0430 04/28/21 0600  BP: 138/74 123/64  Pulse: 76 66  Resp: 14 17  Temp:    SpO2: 99% 100%   Wt Readings from Last 3 Encounters:  03/12/21 73.9 kg  12/31/20 76.7 kg  12/09/20 77.3 kg    General: Pleasant, alert, comfortable.  Does not look ill.  Aged but not frail appearing Head: No facial asymmetry or swelling Eyes: No scleral icterus, no conjunctival pallor. Ears: Somewhat hard of hearing Nose: No congestion or discharge Mouth: Dentures in place.  Tongue midline.  Mucosa moist, pink, clear.  No lesions, no blood. Neck: No JVD, no masses. Lungs: Clear bilaterally.  No labored breathing or cough. Heart: Subtly irregular, rate controlled.  No MRG.  S1, S2 present. Abdomen: Soft w hypoactive but normal quality bowel sounds.  Slight lower abdominal tenderness but no guarding or rebound.  No HSM, masses, bruits, hernias..   Rectal: Did not perform DRE.  There is burgundy red blood with some clots in the bedside commode upon which the patient is seated. Musc/Skeltl: No joint redness, swelling.  Status post left BKA with stump site healthy. Extremities: No CCE. Neurologic: Alert.  Oriented x3.  No gross weakness, no tremors. Skin: No rash, no sores, no telangiectasia. Nodes: No cervical adenopathy. Psych: Cooperative, calm, pleasant.  Fluid speech.  Good historian.   LAB RESULTS: Recent Labs    04/27/21 1932 04/27/21 2327 04/28/21 0117 04/28/21 0424  WBC 12.6* 12.0*  --  11.1*  HGB 9.6* 9.0* 8.8* 8.5*   HCT 30.1* 27.7* 27.0* 26.7*  PLT 178 150  --  161   BMET Lab Results  Component Value Date   NA 135 04/28/2021   NA 134 (L) 04/27/2021   NA 137 02/03/2021   K 4.7 04/28/2021   K 5.3 (H) 04/27/2021   K 4.5 02/03/2021   CL 108 04/28/2021   CL 105 04/27/2021   CL 103 02/03/2021   CO2 18 (L) 04/28/2021   CO2 20 (L) 04/27/2021   CO2 20 02/03/2021   GLUCOSE 98 04/28/2021   GLUCOSE 130 (H) 04/27/2021   GLUCOSE 101 (H) 02/03/2021   BUN 39 (H) 04/28/2021   BUN 40 (H) 04/27/2021   BUN 27 02/03/2021   CREATININE 1.81 (H) 04/28/2021   CREATININE 2.12 (H) 04/27/2021   CREATININE 1.50 (H) 02/03/2021   CALCIUM 8.7 (L) 04/28/2021   CALCIUM 9.1 04/27/2021   CALCIUM 9.2 02/03/2021   LFT Recent Labs    04/27/21  1932  PROT 6.3*  ALBUMIN 2.8*  AST 44*  ALT 37  ALKPHOS 52  BILITOT 0.5   PT/INR Lab Results  Component Value Date   INR 1.7 (H) 04/27/2021   INR 1.11 12/15/2017   INR 1.46 12/13/2017   PROTIME 19.9 01/10/2009    Lipase     Component Value Date/Time   LIPASE 25 04/27/2021 1932      IMPRESSION:      Hematochezia with lower abdominal pain.  Rule out diverticular source, rule out ischemic colitis.  Rule out angiodysplasia.     [place] anemia.  Hgb currently down ~ 2 gm c/w 4 months ago.    A fib, flutter.  Currently rate controlled    PLAN:        Patient set for colonoscopy at 130 today.  Have not initiated antibiotic.   Azucena Freed  04/28/2021, 8:48 AM Phone [place] Attending   I have taken an interval history, reviewed the chart and examined the patient. I agree with the Advanced Practitioner's note, impression and recommendations.  Majority the medical decision-making in the formulation of the assessment and plan were performed by me.   Gatha Mayer, MD, [facility] 04/28/2021 12:34 PM

## 2021-04-28 NOTE — ED Notes (Signed)
Pt returned from ENDO, alert, oriented, has no complaints at this time

## 2021-04-28 NOTE — ED Notes (Signed)
Receiving RN called for report, report given, patient ready to move

## 2021-04-28 NOTE — Anesthesia Postprocedure Evaluation (Signed)
Anesthesia Post Note  Patient: Aditya Nastasi.  Procedure(s) Performed: COLONOSCOPY WITH PROPOFOL POLYPECTOMY     Patient location during evaluation: PACU Anesthesia Type: MAC Level of consciousness: awake and alert Pain management: pain level controlled Vital Signs Assessment: post-procedure vital signs reviewed and stable Respiratory status: spontaneous breathing, nonlabored ventilation, respiratory function stable and patient connected to nasal cannula oxygen Cardiovascular status: stable and blood pressure returned to baseline Postop Assessment: no apparent nausea or vomiting Anesthetic complications: no   No notable events documented.  Last Vitals:  Vitals:   04/28/21 1344 04/28/21 1417  BP: 134/74 (!) 128/54  Pulse: 66 63  Resp: 16 18  Temp:    SpO2: 100% 100%    Last Pain:  Vitals:   04/28/21 1344  TempSrc:   PainSc: 0-No pain                 Tiajuana Amass

## 2021-04-28 NOTE — ED Notes (Signed)
Called to attempt report d/t purple man being green, RN has not yet reviewed chart and will call back to receive report. Name and number left with receiving RN.

## 2021-04-28 NOTE — Plan of Care (Signed)
  Problem: Education: Goal: Knowledge of General Education information will improve Description: Including pain rating scale, medication(s)/side effects and non-pharmacologic comfort measures Outcome: Progressing   Problem: Clinical Measurements: Goal: Will remain free from infection Outcome: Progressing   Problem: Nutrition: Goal: Adequate nutrition will be maintained Outcome: Progressing   Problem: Elimination: Goal: Will not experience complications related to bowel motility Outcome: Progressing   

## 2021-04-29 ENCOUNTER — Ambulatory Visit: Payer: PPO | Admitting: Family Medicine

## 2021-04-29 ENCOUNTER — Encounter (HOSPITAL_COMMUNITY): Payer: Self-pay | Admitting: Internal Medicine

## 2021-04-29 DIAGNOSIS — K5731 Diverticulosis of large intestine without perforation or abscess with bleeding: Principal | ICD-10-CM

## 2021-04-29 LAB — CBC
HCT: 24.5 % — ABNORMAL LOW (ref 39.0–52.0)
HCT: 25.2 % — ABNORMAL LOW (ref 39.0–52.0)
Hemoglobin: 7.9 g/dL — ABNORMAL LOW (ref 13.0–17.0)
Hemoglobin: 7.9 g/dL — ABNORMAL LOW (ref 13.0–17.0)
MCH: 30.4 pg (ref 26.0–34.0)
MCH: 30.2 pg (ref 26.0–34.0)
MCHC: 32.2 g/dL (ref 30.0–36.0)
MCHC: 31.3 g/dL (ref 30.0–36.0)
MCV: 94.2 fL (ref 80.0–100.0)
MCV: 96.2 fL (ref 80.0–100.0)
Platelets: 169 10*3/uL (ref 150–400)
Platelets: 190 10*3/uL (ref 150–400)
RBC: 2.6 MIL/uL — ABNORMAL LOW (ref 4.22–5.81)
RBC: 2.62 MIL/uL — ABNORMAL LOW (ref 4.22–5.81)
RDW: 15.5 % (ref 11.5–15.5)
RDW: 15.5 % (ref 11.5–15.5)
WBC: 8 10*3/uL (ref 4.0–10.5)
WBC: 9.2 10*3/uL (ref 4.0–10.5)
nRBC: 0 % (ref 0.0–0.2)
nRBC: 0 % (ref 0.0–0.2)

## 2021-04-29 LAB — COMPREHENSIVE METABOLIC PANEL
ALT: 28 U/L (ref 0–44)
AST: 32 U/L (ref 15–41)
Albumin: 2.7 g/dL — ABNORMAL LOW (ref 3.5–5.0)
Alkaline Phosphatase: 52 U/L (ref 38–126)
Anion gap: 8 (ref 5–15)
BUN: 26 mg/dL — ABNORMAL HIGH (ref 8–23)
CO2: 20 mmol/L — ABNORMAL LOW (ref 22–32)
Calcium: 8.7 mg/dL — ABNORMAL LOW (ref 8.9–10.3)
Chloride: 108 mmol/L (ref 98–111)
Creatinine, Ser: 1.49 mg/dL — ABNORMAL HIGH (ref 0.61–1.24)
GFR, Estimated: 44 mL/min — ABNORMAL LOW (ref >60)
Glucose, Bld: 129 mg/dL — ABNORMAL HIGH (ref 70–99)
Potassium: 5.1 mmol/L (ref 3.5–5.1)
Sodium: 136 mmol/L (ref 135–145)
Total Bilirubin: 0.4 mg/dL (ref 0.3–1.2)
Total Protein: 5.2 g/dL — ABNORMAL LOW (ref 6.5–8.1)

## 2021-04-29 LAB — BASIC METABOLIC PANEL
Anion gap: 7 (ref 5–15)
BUN: 27 mg/dL — ABNORMAL HIGH (ref 8–23)
CO2: 19 mmol/L — ABNORMAL LOW (ref 22–32)
Calcium: 8.6 mg/dL — ABNORMAL LOW (ref 8.9–10.3)
Chloride: 111 mmol/L (ref 98–111)
Creatinine, Ser: 1.46 mg/dL — ABNORMAL HIGH (ref 0.61–1.24)
GFR, Estimated: 45 mL/min — ABNORMAL LOW (ref >60)
Glucose, Bld: 93 mg/dL (ref 70–99)
Potassium: 4.6 mmol/L (ref 3.5–5.1)
Sodium: 137 mmol/L (ref 135–145)

## 2021-04-29 LAB — IRON AND TIBC
Iron: 35 ug/dL — ABNORMAL LOW (ref 45–182)
Saturation Ratios: 18 % (ref 17.9–39.5)
TIBC: 190 ug/dL — ABNORMAL LOW (ref 250–450)
UIBC: 155 ug/dL

## 2021-04-29 LAB — RETICULOCYTES
Immature Retic Fract: 23.7 % — ABNORMAL HIGH (ref 2.3–15.9)
RBC.: 2.59 MIL/uL — ABNORMAL LOW (ref 4.22–5.81)
Retic Count, Absolute: 54.6 10*3/uL (ref 19.0–186.0)
Retic Ct Pct: 2.1 % (ref 0.4–3.1)

## 2021-04-29 LAB — FERRITIN: Ferritin: 93 ng/mL (ref 24–336)

## 2021-04-29 LAB — VITAMIN B12: Vitamin B-12: 722 pg/mL (ref 180–914)

## 2021-04-29 LAB — SURGICAL PATHOLOGY

## 2021-04-29 LAB — FOLATE: Folate: 27.4 ng/mL (ref >5.9)

## 2021-04-29 MED ORDER — ATENOLOL 25 MG PO TABS
12.5000 mg | ORAL_TABLET | Freq: Every day | ORAL | Status: DC
  Administered 2021-04-29 – 2021-05-01 (×3): 12.5 mg via ORAL
  Filled 2021-04-29 (×3): qty 0.5

## 2021-04-29 MED ORDER — NA FERRIC GLUC CPLX IN SUCROSE 12.5 MG/ML IV SOLN
250.0000 mg | Freq: Every day | INTRAVENOUS | Status: AC
Start: 2021-04-29 — End: 2021-04-30
  Administered 2021-04-29 – 2021-04-30 (×2): 250 mg via INTRAVENOUS
  Filled 2021-04-29 (×2): qty 20

## 2021-04-29 NOTE — TOC Initial Note (Signed)
Transition of Care [facility]) - Initial/Assessment Note    Patient Details  Name: Eric [name]. MRN: [id number] Date of Birth: 12-16-1927  Transition of Care [facility]) CM/SW Contact:    Tom-Johnson, Renea Ee, RN Phone Number: 04/29/2021, 12:55 PM  Clinical Narrative:                 CM spoke with patient at bedside. States he lives with wife at home. Able to drive self, uses back roads and not interstate. Has five children and are supportive. Uses cane at home and walker when going out to the store or appointments. Has a shower bench as well. No financial needs.No PT/OT order at this time. Has not used home health services in the past. Son to transport at discharge. CM will continue to follow with TOC needs.   Expected Discharge Plan: Home/Self Care Barriers to Discharge: Continued Medical Work up   Patient Goals and CMS Choice Patient states their goals for this hospitalization and ongoing recovery are:: To go home CMS Medicare.gov Compare Post Acute Care list provided to:: Patient    Expected Discharge Plan and Services Expected Discharge Plan: Home/Self Care   Discharge Planning Services: CM Consult   Living arrangements for the past 2 months: Single Family Home                 DME Arranged: N/A DME Agency: NA       HH Arranged: NA HH Agency: NA        Prior Living Arrangements/Services Living arrangements for the past 2 months: Single Family Home Lives with:: Spouse Patient language and need for interpreter reviewed:: Yes Do you feel safe going back to the place where you live?: Yes      Need for Family Participation in Patient Care: Yes (Comment) Care giver support system in place?: Yes (comment) Current home services: DME (Cane, walker, shower bench.) Criminal Activity/Legal Involvement Pertinent to Current Situation/Hospitalization: No - Comment as needed  Activities of Daily Living      Permission Sought/Granted Permission sought to share  information with : Case Manager, Family Supports Permission granted to share information with : Yes, Verbal Permission Granted              Emotional Assessment Appearance:: Appears stated age Attitude/Demeanor/Rapport: Engaged Affect (typically observed): Accepting, Appropriate, Hopeful Orientation: : Oriented to Self, Oriented to Place, Oriented to  Time, Oriented to Situation Alcohol / Substance Use: Not Applicable Psych Involvement: No (comment)  Admission diagnosis:  GI bleed [K92.2] Acute GI bleeding [K92.2] Patient Active Problem List   Diagnosis Date Noted   Erosive gastritis    Benign neoplasm of transverse colon    Benign neoplasm of descending colon    Acute GI bleeding 04/27/2021   Acute blood loss anemia 04/27/2021   Acute kidney injury superimposed on CKD ([place]) 04/27/2021   Hematuria 07/24/2020   Hypertensive urgency 03/08/2019   Hx of BKA, left (HCC) 01/13/2018   Nausea and vomiting in adult 12/26/2017   Pressure injury of skin 12/23/2017   C. difficile colitis 12/22/2017   UTI (urinary tract infection) 12/22/2017   Hyponatremia 12/22/2017   Normocytic anemia 12/22/2017   ASO (arteriosclerosis obliterans) 11/23/2017   Chronic anticoagulation 09/26/2015   History of anticoagulant therapy 09/26/2015   PVD (peripheral vascular disease) ([place]) 06/02/2012   Type 2 diabetes mellitus ([place]) 05/17/2012   Benign prostatic hyperplasia 11/16/2011   AKI (acute kidney injury) ([place]) 08/26/2011   Chronic kidney disease (CKD), stage III (moderate) (  [place]) 08/26/2011   Chronic coronary artery disease 03/25/2011   Obstructive sleep apnea 09/15/2010   Other specified extrapyramidal and movement disorders 09/15/2010   PERSONAL HISTORY OF COLONIC POLYPS 10/25/2009   Vitamin B-complex deficiency 03/04/2009   Atrial flutter ([place]) 12/25/2008   Hypertriglyceridemia 11/24/2006   Essential hypertension 11/24/2006   PCP:  [name], MD Pharmacy:   [facility] [address], [place] [address] Phone: [phone] Fax: [phone]  [facility] ([place]) [place], [place] [address] Phone: [phone] Fax: [phone]     Social Determinants of Health (SDOH) Interventions    Readmission Risk Interventions No flowsheet data found.

## 2021-04-29 NOTE — Plan of Care (Signed)

## 2021-04-29 NOTE — Progress Notes (Addendum)
          Daily Rounding Note  04/29/2021, 12:22 PM  LOS: 2 days   SUBJECTIVE:   Chief complaint: hematochezia.       Stools are now brown.  He has been walking in the hallway for prolonged periods of times without dizziness, shortness of breath, weakness, angina.  OBJECTIVE:         Vital signs in last 24 hours:    Temp:  [97.2 F (36.2 C)-98 F (36.7 C)] 97.7 F (36.5 C) (09/20 0500) Pulse Rate:  [58-68] 67 (09/20 0500) Resp:  [14-19] 18 (09/20 0500) BP: (103-142)/(36-74) 137/62 (09/20 0500) SpO2:  [97 %-100 %] 99 % (09/20 0500) Weight:  [74.8 kg] 74.8 kg (09/19 1235) Last BM Date: 04/28/21 Filed Weights   04/28/21 1235  Weight: 74.8 kg   General: Looks well.  Elderly but not frail. Heart: irreg, irreg. rate controlled. Chest: Clear bilaterally.  No labored breathing.  No cough. Abdomen: Not tender or distended.  Active bowel sounds. Extremities: No CCE. Neuro/Psych: Pleasant, calm, fluid speech.  Fully alert and oriented.  No tremors or gross deficits.  I  Lab Results: Recent Labs    04/28/21 0424 04/28/21 1831 04/29/21 0709  WBC 11.1* 9.5 8.0  HGB 8.5* 8.3* 7.9*  HCT 26.7* 26.4* 24.5*  PLT 161 164 169   BMET Recent Labs    04/27/21 1932 04/28/21 0424 04/29/21 0709  NA 134* 135 137  K 5.3* 4.7 4.6  CL 105 108 111  CO2 20* 18* 19*  GLUCOSE 130* 98 93  BUN 40* 39* 27*  CREATININE 2.12* 1.81* 1.46*  CALCIUM 9.1 8.7* 8.6*     ASSESMENT:   Painless hematochezia. 04/28/2021 colonoscopy.  5 diminutive polyps resected and retrieved.  Severe diverticulosis involving sigmoid, descending, transverse, and ascending colon.  Though no active bleeding, suspect diverticular bleed with source of hematochezia.  Nonbleeding external/internal hemorrhoids.  Previous tubular adenomatous and adenomatous colon polyps on colonoscopies 2008, 2011.    [place] anemia.  Hgb 9.6 >>7.9.  no prbcs to date.      AKI,  improved.  Chronic Eliquis.  On hold.  INR 1.7.    PLAN   Await pathology from the colon polyps.     Restart Eliquis in ~ 1 week, ~ 9/26  Question safe to discharge patient today.  Clinically he is doing great but his Hgb has been steadily declining.  Note that labs for iron studies, B12, folate have been collected this morning.  If deficient, add supplementation.    Azucena Freed  04/29/2021, 12:22 PM Phone [phone]     [place] Attending   I have taken an interval history, reviewed the chart and examined the patient. I agree with the Advanced Practitioner's note, impression and recommendations.  Majority the medical decision-making in the formulation of the assessment and plan were performed by me.  No further bleeding from presumed diverticular hemorrhage.  I am not concerned about the Hgb decline but it is prudent to observe and see if he will need a transfusion.  Am confident that he is not bleeding or we would have seen it.  He does not need a GI visit as outpatient.  He needs to see PCP and get Hgb chceked and have f/u there. Would dc on ferrous sulfate 325 mg daily.  I will f/u polyp pathology and let him know.  Gatha Mayer, MD, [facility] 04/29/2021 3:59 PM

## 2021-04-29 NOTE — Evaluation (Signed)
Physical Therapy Evaluation & Discharge Patient Details Name: Eric [name]. MRN: [id number] DOB: 04/22/1928 Today's Date: 04/29/2021  History of Present Illness  Pt is a 87 y.o. male admitted 04/27/21 for multiple episodes of hematochezia, abdominal pain, suspect lower GIB. S/p colonoscopy 9/19 showing polyps, severe diverticulosis, hemorrhoids. PMH includes PAD s/p L BKA (12/2017), HTN, DM2, CKD 3, CAD s/p CABG, afib on Eliquis, BPH, HLD.   Clinical Impression  Patient evaluated by Physical Therapy with no further acute PT needs identified. PTA, pt mod indep with SPC, lives with wife, drives. Today, pt moving well with [facility] and supervision for safety. All education has been completed and the patient has no further questions. Acute PT is signing off. Thank you for this referral.     Recommendations for follow up therapy are one component of a multi-disciplinary discharge planning process, led by the attending physician.  Recommendations may be updated based on patient status, additional functional criteria and insurance authorization.  Follow Up Recommendations No PT follow up;Supervision - Intermittent    Equipment Recommendations  None recommended by PT    Recommendations for Other Services       Precautions / Restrictions Precautions Precautions: Fall;Other (comment) Precaution Comments: H/o L BKA (12/2017) with prosthetic in room Restrictions Weight Bearing Restrictions: No      Mobility  Bed Mobility Overal bed mobility: Modified Independent             General bed mobility comments: HOB slightly elevated    Transfers Overall transfer level: Modified independent Equipment used: Straight cane                Ambulation/Gait Ambulation/Gait assistance: Supervision Gait Distance (Feet): 360 Feet Assistive device: Straight cane Gait Pattern/deviations: Step-through pattern;Decreased stride length Gait velocity: Decreased   General Gait Details: Slow,  steady gait with SPC and supervision for safety  Stairs            Wheelchair Mobility    Modified Rankin (Stroke Patients Only)       Balance Overall balance assessment: Needs assistance   Sitting balance-Leahy Scale: Good       Standing balance-Leahy Scale: Fair Standing balance comment: can static stand and walk without UE support; static and dynamic stability improved with SPC                             Pertinent Vitals/Pain Pain Assessment: No/denies pain    Home Living Family/patient expects to be discharged to:: Private residence Living Arrangements: Spouse/significant other Available Help at Discharge: Family;Available 24 hours/day Type of Home: House Home Access: Level entry     Home Layout: Laundry or work area in [place]: Environmental consultant - 2 wheels;Cane - single point;Transport chair      Prior Function Level of Independence: Independent with assistive device(s)         Comments: Household ambulation without DME; use of SPC for community distances; drives     Hand Dominance   Dominant Hand: Right    Extremity/Trunk Assessment   Upper Extremity Assessment Upper Extremity Assessment: Overall WFL for tasks assessed    Lower Extremity Assessment Lower Extremity Assessment: Overall WFL for tasks assessed (h/o L BKA; good functional strength with prosthetic wear)       Communication   Communication: No difficulties  Cognition Arousal/Alertness: Awake/alert Behavior During Therapy: WFL for tasks assessed/performed Overall Cognitive Status: Within Functional Limits for tasks assessed  General Comments General comments (skin integrity, edema, etc.): Reports no issues or concerns regarding L BKA prosthetic fit and use    Exercises     Assessment/Plan    PT Assessment Patent does not need any further PT services  PT Problem List         PT Treatment  Interventions      PT Goals (Current goals can be found in the Care Plan section)  Acute Rehab PT Goals PT Goal Formulation: All assessment and education complete, DC therapy    Frequency     Barriers to discharge        Co-evaluation               AM-PAC PT "6 Clicks" Mobility  Outcome Measure Help needed turning from your back to your side while in a flat bed without using bedrails?: None Help needed moving from lying on your back to sitting on the side of a flat bed without using bedrails?: None Help needed moving to and from a bed to a chair (including a wheelchair)?: None Help needed standing up from a chair using your arms (e.g., wheelchair or bedside chair)?: None Help needed to walk in hospital room?: A Little Help needed climbing 3-5 steps with a railing? : A Little 6 Click Score: 22    End of Session Equipment Utilized During Treatment: Gait belt Activity Tolerance: Patient tolerated treatment well Patient left: in chair;with call bell/phone within reach Nurse Communication: Mobility status (RN agreement that pt does not need chair alarm) PT Visit Diagnosis: Other abnormalities of gait and mobility (R26.89)    Time: 4076-8088 PT Time Calculation (min) (ACUTE ONLY): 18 min   Charges:   PT Evaluation $PT Eval Low Complexity: Dix, PT, DPT Acute Rehabilitation Services  Pager [phone] Office [name] 04/29/2021, 12:11 PM

## 2021-04-29 NOTE — Progress Notes (Signed)
Patient seen for PT Evaluation imminent d/c order. Patient presents at baseline mobility, ambulating well with SPC. No acute PT needs identified. Patient will not require DME or follow-up PT for d/c.  Formal PT Evaluation note to follow.  Mabeline Caras, PT, DPT Acute Rehabilitation Services  Pager [phone] Office [phone]

## 2021-04-29 NOTE — Progress Notes (Addendum)
PROGRESS NOTE    Eric [name].  FXT:[id number] DOB: 1928/05/27 DOA: 04/27/2021 PCP: [name], MD  Brief Narrative:Eric W Elsworth Ledin. is a 87 y.o. male with medical history significant of diverticulosis, permanent Afib on Eliquis, CAD status post CABG, CKD stage III, diet-controlled type 2 diabetes, OSA, PAD status post left BKA, hypertension, hyperlipidemia, BPH presented to the ED for evaluation of multiple episodes of bright red blood per rectum x3 days associated with moderate lower abdominal pain. -Hemoglobin 9.6 on admission from baseline around 11, WBC was 12.6, creatinine 2.1 up from baseline of 1.1, EDP discussed with gastroenterology, plan for colonoscopy, CT Abd deferred in the setting of AKI last night  Assessment & Plan:   Hematochezia, diverticular bleed Acute blood loss anemia -Admitted with persistent lower GI bleed for 3 days, had multiple episodes in the ED since admission -Hemoglobin down to 7.9 today, baseline around 11, will repeat -No further active bleeding, colonoscopy yesterday noted severe diverticulosis also had 5 polyps that were removed, recommended to hold Eliquis for 5 days  -Check anemia panel, will repeat labs -Ambulate with PT -Discharge planning, home later today versus a.m.   AKI  Metabolic acidosis Hyperkalemia -Likely prerenal in the setting of acute GI bleed.  BUN 40, creatinine 2.1 (baseline 1.1). -Improving, lisinopril and Lasix on hold   Permanent A. Fib -Atenolol on hold, resume today, Eliquis also on hold for 5 days total   CAD status post CABG -Continue statin, atenolol resumed   Diet-controlled type 2 diabetes A1c 5.7 on 12/31/2020.   Hypertension -Restarting atenolol, lisinopril on hold   Hyperlipidemia -Continue crestor   BPH -Continue finasteride and tamsulosin  Peripheral vascular disease Left BKA   DVT prophylaxis: SCDs Code Status: full code per pts wishes Family Communication: No family at bedside, left  message for patient's son [name] Disposition Plan: Status is: Inpatient  Remains inpatient appropriate because:Inpatient level of care appropriate due to severity of illness  Dispo: The patient is from: Home              Anticipated d/c is to: Home              Patient currently is not medically stable to d/c.   Difficult to place patient No   Consultants:  Gastroenterology  Procedures:  Impression:               - Five diminutive polyps in the descending colon                            and in the transverse colon, removed with a cold                            snare. Max size 5 mm. Resected and retrieved.                           - Severe diverticulosis in the sigmoid colon, in                            the descending colon, in the transverse colon and                            in the ascending colon. There was no evidence of  diverticular bleeding now but i think he has had a                            diverticular bleed.                           - External and internal hemorrhoids. Not a bleeding                            source in my opinion Recommendation:           - Return patient to hospital ward for ongoing care.                           - Full liquid diet.                           - Continue to hold Eliquis - anticipate                            recommending leaving off x 1 week - if well                            tomorrow may be dc I think                           - No repeat colonoscopy due to age.  Antimicrobials:    Subjective: -Feels okay, no further bleeding  Objective: Vitals:   04/28/21 1417 04/28/21 1727 04/28/21 2019 04/29/21 0500  BP: (!) 128/54 (!) 142/43 (!) 130/55 137/62  Pulse: 63 (!) 58 61 67  Resp: 18 18 18 18   Temp:  98 F (36.7 C) 97.7 F (36.5 C) 97.7 F (36.5 C)  TempSrc:   Oral Oral  SpO2: 100% 100% 97% 99%  Weight:      Height:        Intake/Output Summary (Last 24 hours) at 04/29/2021 1443 Last  data filed at 04/29/2021 1300 Gross per 24 hour  Intake 720 ml  Output --  Net 720 ml   Filed Weights   04/28/21 1235  Weight: 74.8 kg    Examination:  General exam: Pleasant elderly male, laying in bed, hard of hearing, AAOx3, no distress HEENT: No JVD CVS: S1-S2, regular rate rhythm Lungs: Decreased breath sounds to bases otherwise clear Abdomen: Soft, less distended, mild lower abdominal tenderness, no rigidity or rebound, bowel sounds present Extremities: No edema, left BKA Skin: No rashes Psychiatry: Mood & affect appropriate.     Data Reviewed:   CBC: Recent Labs  Lab 04/27/21 1932 04/27/21 2327 04/28/21 0117 04/28/21 0424 04/28/21 1831 04/29/21 0709  WBC 12.6* 12.0*  --  11.1* 9.5 8.0  NEUTROABS 8.0*  --   --   --   --   --   HGB 9.6* 9.0* 8.8* 8.5* 8.3* 7.9*  HCT 30.1* 27.7* 27.0* 26.7* 26.4* 24.5*  MCV 96.2 94.9  --  96.0 95.0 94.2  PLT 178 150  --  161 164 144   Basic Metabolic Panel: Recent Labs  Lab 04/27/21 1932 04/28/21 0424 04/29/21 0709  NA 134* 135 137  K 5.3* 4.7 4.6  CL 105 108  111  CO2 20* 18* 19*  GLUCOSE 130* 98 93  BUN 40* 39* 27*  CREATININE 2.12* 1.81* 1.46*  CALCIUM 9.1 8.7* 8.6*   GFR: Estimated Creatinine Clearance: 31.1 mL/min (A) (by C-G formula based on SCr of 1.46 mg/dL (H)). Liver Function Tests: Recent Labs  Lab 04/27/21 1932  AST 44*  ALT 37  ALKPHOS 52  BILITOT 0.5  PROT 6.3*  ALBUMIN 2.8*   Recent Labs  Lab 04/27/21 1932  LIPASE 25   No results for input(s): AMMONIA in the last 168 hours. Coagulation Profile: Recent Labs  Lab 04/27/21 1932  INR 1.7*   Cardiac Enzymes: No results for input(s): CKTOTAL, CKMB, CKMBINDEX, TROPONINI in the last 168 hours. BNP (last 3 results) Recent Labs    12/09/20 1140 12/30/20 1159 02/03/21 0920  PROBNP 1,983* 2,570* 2,204*   HbA1C: No results for input(s): HGBA1C in the last 72 hours. CBG: No results for input(s): GLUCAP in the last 168 hours. Lipid  Profile: No results for input(s): CHOL, HDL, LDLCALC, TRIG, CHOLHDL, LDLDIRECT in the last 72 hours. Thyroid Function Tests: No results for input(s): TSH, T4TOTAL, FREET4, T3FREE, THYROIDAB in the last 72 hours. Anemia Panel: No results for input(s): VITAMINB12, FOLATE, FERRITIN, TIBC, IRON, RETICCTPCT in the last 72 hours. Urine analysis:    Component Value Date/Time   COLORURINE BROWN (A) 07/24/2020 1006   APPEARANCEUR Cloudy (A) 03/12/2021 1147   LABSPEC 1.015 07/24/2020 1006   PHURINE 5.5 07/24/2020 1006   GLUCOSEU Negative 03/12/2021 1147   HGBUR 3+ (A) 07/24/2020 1006   HGBUR trace-lysed 02/07/2008 1101   BILIRUBINUR Negative 03/12/2021 1147   KETONESUR NEGATIVE 07/24/2020 1006   PROTEINUR 1+ (A) 03/12/2021 1147   PROTEINUR 3+ (A) 07/24/2020 1006   UROBILINOGEN 0.2 02/07/2008 1101   NITRITE Positive (A) 03/12/2021 1147   NITRITE NEGATIVE 07/24/2020 1006   LEUKOCYTESUR 3+ (A) 03/12/2021 1147   LEUKOCYTESUR 1+ (A) 07/24/2020 1006   Sepsis Labs: @LABRCNTIP (procalcitonin:4,lacticidven:4)  ) Recent Results (from the past 240 hour(s))  SARS CORONAVIRUS 2 (TAT 6-24 HRS) Nasopharyngeal Nasopharyngeal Swab     Status: None   Collection Time: 04/27/21 10:32 PM   Specimen: Nasopharyngeal Swab  Result Value Ref Range Status   SARS Coronavirus 2 NEGATIVE NEGATIVE Final    Comment: (NOTE) SARS-CoV-2 target nucleic acids are NOT DETECTED.  The SARS-CoV-2 RNA is generally detectable in upper and lower respiratory specimens during the acute phase of infection. Negative results do not preclude SARS-CoV-2 infection, do not rule out co-infections with other pathogens, and should not be used as the sole basis for treatment or other patient management decisions. Negative results must be combined with clinical observations, patient history, and epidemiological information. The expected result is Negative.  Fact Sheet for Patients: SugarRoll.be  Fact  Sheet for Healthcare Providers: https://www.woods-mathews.com/  This test is not yet approved or cleared by the [place] FDA and  has been authorized for detection and/or diagnosis of SARS-CoV-2 by FDA under an Emergency Use Authorization (EUA). This EUA will remain  in effect (meaning this test can be used) for the duration of the COVID-19 declaration under Se ction 564(b)(1) of the Act, 21 U.S.C. section 360bbb-3(b)(1), unless the authorization is terminated or revoked sooner.  Performed at [facility], [facility] [address] [place], [place] [address]     Scheduled Meds:  finasteride  5 mg Oral Daily   rosuvastatin  40 mg Oral Daily   tamsulosin  0.4 mg Oral Daily   Continuous Infusions:  LOS: 2 days   Time spent: 41min  Domenic Polite, MD [facility]   04/29/2021, 2:43 PM

## 2021-04-30 LAB — BASIC METABOLIC PANEL
Anion gap: 6 (ref 5–15)
BUN: 22 mg/dL (ref 8–23)
CO2: 20 mmol/L — ABNORMAL LOW (ref 22–32)
Calcium: 8.5 mg/dL — ABNORMAL LOW (ref 8.9–10.3)
Chloride: 110 mmol/L (ref 98–111)
Creatinine, Ser: 1.49 mg/dL — ABNORMAL HIGH (ref 0.61–1.24)
GFR, Estimated: 44 mL/min — ABNORMAL LOW (ref >60)
Glucose, Bld: 102 mg/dL — ABNORMAL HIGH (ref 70–99)
Potassium: 5 mmol/L (ref 3.5–5.1)
Sodium: 136 mmol/L (ref 135–145)

## 2021-04-30 LAB — PREPARE RBC (CROSSMATCH)

## 2021-04-30 LAB — CBC
HCT: 22.4 % — ABNORMAL LOW (ref 39.0–52.0)
Hemoglobin: 7.2 g/dL — ABNORMAL LOW (ref 13.0–17.0)
MCH: 30.6 pg (ref 26.0–34.0)
MCHC: 32.1 g/dL (ref 30.0–36.0)
MCV: 95.3 fL (ref 80.0–100.0)
Platelets: 163 10*3/uL (ref 150–400)
RBC: 2.35 MIL/uL — ABNORMAL LOW (ref 4.22–5.81)
RDW: 15.6 % — ABNORMAL HIGH (ref 11.5–15.5)
WBC: 8.8 10*3/uL (ref 4.0–10.5)
nRBC: 0 % (ref 0.0–0.2)

## 2021-04-30 LAB — FERRITIN: Ferritin: 89 ng/mL (ref 24–336)

## 2021-04-30 MED ORDER — SODIUM CHLORIDE 0.9% IV SOLUTION: Freq: Once | INTRAVENOUS | Status: AC

## 2021-04-30 NOTE — Progress Notes (Signed)
   Patient Name: Eric [name]. Date of Encounter: 04/30/2021, 12:09 PM    Subjective  No bleeding seen   Objective  BP (!) 127/45 (BP Location: Right Arm)   Pulse (!) 51   Temp 98.1 F (36.7 C) (Oral)   Resp 18   Ht 5\' 5"  (1.651 m)   Wt 74.8 kg   SpO2 99%   BMI 27.46 kg/m   Vigorously eating spaghetti  CBC Latest Ref Rng & Units 04/30/2021 04/29/2021 04/29/2021  WBC 4.0 - 10.5 K/uL 8.8 9.2 8.0  Hemoglobin 13.0 - 17.0 g/dL 7.2(L) 7.9(L) 7.9(L)  Hematocrit 39.0 - 52.0 % 22.4(L) 25.2(L) 24.5(L)  Platelets 150 - 400 K/uL 163 190 169      Assessment and Plan  Diverticulosis w/ hemorrhage Benign colon adenomas removed  Hgb has drifted lower - agree w/ transfusion and home tomorrow and off Eliquis x 1 week total  W/ ferritin 93 though NL I think oral OTC iron x 2 months snsible  Signing off - he will need PCP to recheck HGb  Gatha Mayer, MD, [facility] 04/30/2021 12:09 PM

## 2021-04-30 NOTE — Progress Notes (Signed)
PROGRESS NOTE    Eric [name].  QMG:[id number] DOB: [birth year] DOA: 04/27/2021 PCP: [name], MD  Brief Narrative:Eric [name]. is a 86 y.o. male with medical history significant of diverticulosis, permanent Afib on Eliquis, CAD status post CABG, CKD stage III, diet-controlled type 2 diabetes, OSA, PAD status post left BKA, hypertension, hyperlipidemia, BPH presented to the ED for evaluation of multiple episodes of bright red blood per rectum x3 days associated with moderate lower abdominal pain. -Hemoglobin 9.6 on admission from baseline around 11, WBC was 12.6, creatinine 2.1 up from baseline of 1.1, EDP discussed with gastroenterology, plan for colonoscopy, CT Abd deferred in the setting of AKI last night  Assessment & Plan:   Hematochezia, diverticular bleed Acute blood loss anemia -Admitted with persistent lower GI bleed for 3 days, had multiple episodes in the ED since admission -No further active bleeding, colonoscopy 9/19 noted severe diverticulosis also had 5 polyps that were resected, recommended to hold Plavix for 5 days total -Hemoglobin down to 7.2 today, has been slowly drifting down, baseline around 11, no active bleeding any longer, will transfuse 1 unit of PRBC today, continue IV iron -Ambulating with PT -Home tomorrow if hemoglobin is stable   AKI  Metabolic acidosis Hyperkalemia -Likely prerenal in the setting of acute GI bleed.  BUN 40, creatinine 2.1 (baseline 1.1). -Improving, lisinopril and Lasix on hold -Resume low-dose Lasix at discharge   Permanent A. Fib -Restarted atenolol, Eliquis on hold for 5 days total, restart 9/25   CAD status post CABG -Continue statin, atenolol resumed   Diet-controlled type 2 diabetes A1c 5.7 on 12/31/2020.   Hypertension -Restarting atenolol, lisinopril on hold   Hyperlipidemia -Continue crestor   BPH -Continue finasteride and tamsulosin  Peripheral vascular disease Left BKA   DVT prophylaxis:  SCDs Code Status: full code per pts wishes Family Communication: No family at bedside, updated son Mazen Marcin Disposition Plan: Status is: Inpatient  Remains inpatient appropriate because:Inpatient level of care appropriate due to severity of illness  Dispo: The patient is from: Home              Anticipated d/c is to: Home hopefully tomorrow              Patient currently is not medically stable to d/c.   Difficult to place patient No   Consultants:  Gastroenterology  Procedures:  Impression:               - Five diminutive polyps in the descending colon                            and in the transverse colon, removed with a cold                            snare. Max size 5 mm. Resected and retrieved.                           - Severe diverticulosis in the sigmoid colon, in                            the descending colon, in the transverse colon and  in the ascending colon. There was no evidence of                            diverticular bleeding now but i think he has had a                            diverticular bleed.                           - External and internal hemorrhoids. Not a bleeding                            source in my opinion Recommendation:           - Return patient to hospital ward for ongoing care.                           - Full liquid diet.                           - Continue to hold Eliquis - anticipate                            recommending leaving off x 1 week - if well                            tomorrow may be dc I think                           - No repeat colonoscopy due to age.  Antimicrobials:    Subjective: -Feels okay, no further bleeding  Objective: Vitals:   04/30/21 0843 04/30/21 0915 04/30/21 0952 04/30/21 1002  BP: (!) 121/48 (!) 117/57 115/62 (!) 127/45  Pulse: (!) 59 (!) 53 97 (!) 51  Resp: 18 18 18 18   Temp: 97.9 F (36.6 C) (!) 97.5 F (36.4 C) 98.4 F (36.9 C) 98.1 F (36.7 C)  TempSrc:  Oral Oral  Oral  SpO2: 99% 96% 92% 99%  Weight:      Height:        Intake/Output Summary (Last 24 hours) at 04/30/2021 1118 Last data filed at 04/29/2021 2117 Gross per 24 hour  Intake 440 ml  Output 1 ml  Net 439 ml   Filed Weights   04/28/21 1235 04/29/21 2117  Weight: 74.8 kg 74.8 kg    Examination:  General exam: Pleasant elderly male sitting up in bed, hard of hearing, AAOx3, no distress HEENT: No JVD CVS: S1-S2, regular rate rhythm Lungs: Decreased breath sounds to bases otherwise clear Abdomen: Soft, less distended, nontender today, bowel sounds present Extremities: No edema, left BKA  Skin: No rashes Psychiatry: Mood & affect appropriate.     Data Reviewed:   CBC: Recent Labs  Lab 04/27/21 1932 04/27/21 2327 04/28/21 0424 04/28/21 1831 04/29/21 0709 04/29/21 1435 04/30/21 0317  WBC 12.6*   < > 11.1* 9.5 8.0 9.2 8.8  NEUTROABS 8.0*  --   --   --   --   --   --   HGB 9.6*   < > 8.5* 8.3* 7.9* 7.9* 7.2*  HCT 30.1*   < >  26.7* 26.4* 24.5* 25.2* 22.4*  MCV 96.2   < > 96.0 95.0 94.2 96.2 95.3  PLT 178   < > 161 164 169 190 163   < > = values in this interval not displayed.   Basic Metabolic Panel: Recent Labs  Lab 04/27/21 1932 04/28/21 0424 04/29/21 0709 04/29/21 1609 04/30/21 0317  NA 134* 135 137 136 136  K 5.3* 4.7 4.6 5.1 5.0  CL 105 108 111 108 110  CO2 20* 18* 19* 20* 20*  GLUCOSE 130* 98 93 129* 102*  BUN 40* 39* 27* 26* 22  CREATININE 2.12* 1.81* 1.46* 1.49* 1.49*  CALCIUM 9.1 8.7* 8.6* 8.7* 8.5*   GFR: Estimated Creatinine Clearance: 30.5 mL/min (A) (by C-G formula based on SCr of 1.49 mg/dL (H)). Liver Function Tests: Recent Labs  Lab 04/27/21 1932 04/29/21 1609  AST 44* 32  ALT 37 28  ALKPHOS 52 52  BILITOT 0.5 0.4  PROT 6.3* 5.2*  ALBUMIN 2.8* 2.7*   Recent Labs  Lab 04/27/21 1932  LIPASE 25   No results for input(s): AMMONIA in the last 168 hours. Coagulation Profile: Recent Labs  Lab 04/27/21 1932  INR 1.7*    Cardiac Enzymes: No results for input(s): CKTOTAL, CKMB, CKMBINDEX, TROPONINI in the last 168 hours. BNP (last 3 results) Recent Labs    12/09/20 1140 12/30/20 1159 02/03/21 0920  PROBNP 1,983* 2,570* 2,204*   HbA1C: No results for input(s): HGBA1C in the last 72 hours. CBG: No results for input(s): GLUCAP in the last 168 hours. Lipid Profile: No results for input(s): CHOL, HDL, LDLCALC, TRIG, CHOLHDL, LDLDIRECT in the last 72 hours. Thyroid Function Tests: No results for input(s): TSH, T4TOTAL, FREET4, T3FREE, THYROIDAB in the last 72 hours. Anemia Panel: Recent Labs    04/29/21 1435 04/29/21 1609  VITAMINB12 722  --   FOLATE 27.4  --   FERRITIN 89 93  TIBC 190*  --   IRON 35*  --   RETICCTPCT 2.1  --    Urine analysis:    Component Value Date/Time   COLORURINE BROWN (A) 07/24/2020 1006   APPEARANCEUR Cloudy (A) 03/12/2021 1147   LABSPEC 1.015 07/24/2020 1006   PHURINE 5.5 07/24/2020 1006   GLUCOSEU Negative 03/12/2021 1147   HGBUR 3+ (A) 07/24/2020 1006   HGBUR trace-lysed 02/07/2008 1101   BILIRUBINUR Negative 03/12/2021 1147   KETONESUR NEGATIVE 07/24/2020 1006   PROTEINUR 1+ (A) 03/12/2021 1147   PROTEINUR 3+ (A) 07/24/2020 1006   UROBILINOGEN 0.2 02/07/2008 1101   NITRITE Positive (A) 03/12/2021 1147   NITRITE NEGATIVE 07/24/2020 1006   LEUKOCYTESUR 3+ (A) 03/12/2021 1147   LEUKOCYTESUR 1+ (A) 07/24/2020 1006   Sepsis Labs: @LABRCNTIP (procalcitonin:4,lacticidven:4)  ) Recent Results (from the past 240 hour(s))  SARS CORONAVIRUS 2 (TAT 6-24 HRS) Nasopharyngeal Nasopharyngeal Swab     Status: None   Collection Time: 04/27/21 10:32 PM   Specimen: Nasopharyngeal Swab  Result Value Ref Range Status   SARS Coronavirus 2 NEGATIVE NEGATIVE Final    Comment: (NOTE) SARS-CoV-2 target nucleic acids are NOT DETECTED.  The SARS-CoV-2 RNA is generally detectable in upper and lower respiratory specimens during the acute phase of infection. Negative results  do not preclude SARS-CoV-2 infection, do not rule out co-infections with other pathogens, and should not be used as the sole basis for treatment or other patient management decisions. Negative results must be combined with clinical observations, patient history, and epidemiological information. The expected result is Negative.  Fact Sheet for Patients:  SugarRoll.be  Fact Sheet for Healthcare Providers: https://www.woods-mathews.com/  This test is not yet approved or cleared by the [place] FDA and  has been authorized for detection and/or diagnosis of SARS-CoV-2 by FDA under an Emergency Use Authorization (EUA). This EUA will remain  in effect (meaning this test can be used) for the duration of the COVID-19 declaration under Se ction 564(b)(1) of the Act, 21 U.S.C. section 360bbb-3(b)(1), unless the authorization is terminated or revoked sooner.  Performed at [facility], [place] [address] [place], [place] [address]     Scheduled Meds:  atenolol  12.5 mg Oral Daily   finasteride  5 mg Oral Daily   rosuvastatin  40 mg Oral Daily   tamsulosin  0.4 mg Oral Daily   Continuous Infusions:  ferric gluconate (FERRLECIT) IVPB 250 mg (04/29/21 1826)     LOS: 3 days   Time spent: 53min  Domenic Polite, MD [facility]   04/30/2021, 11:18 AM

## 2021-04-30 NOTE — Care Management Important Message (Signed)
Important Message  Patient Details  Name: Eric [name]. MRN: [id number] Date of Birth: 01-09-1928   Medicare Important Message Given:  Yes     Marvina Danner 04/30/2021, 12:02 PM

## 2021-05-01 ENCOUNTER — Telehealth: Payer: Self-pay

## 2021-05-01 DIAGNOSIS — K922 Gastrointestinal hemorrhage, unspecified: Secondary | ICD-10-CM

## 2021-05-01 LAB — BASIC METABOLIC PANEL
Anion gap: 5 (ref 5–15)
BUN: 21 mg/dL (ref 8–23)
CO2: 21 mmol/L — ABNORMAL LOW (ref 22–32)
Calcium: 8.5 mg/dL — ABNORMAL LOW (ref 8.9–10.3)
Chloride: 112 mmol/L — ABNORMAL HIGH (ref 98–111)
Creatinine, Ser: 1.48 mg/dL — ABNORMAL HIGH (ref 0.61–1.24)
GFR, Estimated: 44 mL/min — ABNORMAL LOW (ref >60)
Glucose, Bld: 90 mg/dL (ref 70–99)
Potassium: 4.7 mmol/L (ref 3.5–5.1)
Sodium: 138 mmol/L (ref 135–145)

## 2021-05-01 LAB — TYPE AND SCREEN
ABO/RH(D): B POS
Antibody Screen: NEGATIVE
Unit division: 0

## 2021-05-01 LAB — BPAM RBC
Blood Product Expiration Date: 202210062359
ISSUE DATE / TIME: 202209210855
Unit Type and Rh: 7300

## 2021-05-01 LAB — CBC
HCT: 28.7 % — ABNORMAL LOW (ref 39.0–52.0)
Hemoglobin: 9.2 g/dL — ABNORMAL LOW (ref 13.0–17.0)
MCH: 29.9 pg (ref 26.0–34.0)
MCHC: 32.1 g/dL (ref 30.0–36.0)
MCV: 93.2 fL (ref 80.0–100.0)
Platelets: 190 10*3/uL (ref 150–400)
RBC: 3.08 MIL/uL — ABNORMAL LOW (ref 4.22–5.81)
RDW: 17.8 % — ABNORMAL HIGH (ref 11.5–15.5)
WBC: 11.6 10*3/uL — ABNORMAL HIGH (ref 4.0–10.5)
nRBC: 0 % (ref 0.0–0.2)

## 2021-05-01 MED ORDER — APIXABAN 5 MG PO TABS: 5.0000 mg | ORAL_TABLET | Freq: Two times a day (BID) | ORAL | 0 refills | Status: DC

## 2021-05-01 NOTE — TOC Transition Note (Addendum)
Transition of Care [facility]) - CM/SW Discharge Note   Patient Details  Name: Eric [name]. MRN: [id number] Date of Birth: [birth year]  Transition of Care [facility]) CM/SW Contact:  Tom-Johnson, Renea Ee, RN Phone Number: 05/01/2021, 12:25 PM   Clinical Narrative:    Patient is medically stable for discharge. No further needs from PT/OT and no recommendations noted. Son to transport at discharge. No further needs form TOC.    Final next level of care: Home/Self Care Barriers to Discharge: No Barriers Identified   Patient Goals and CMS Choice Patient states their goals for this hospitalization and ongoing recovery are:: To go home CMS Medicare.gov Compare Post Acute Care list provided to:: Patient Choice offered to / list presented to : NA  Discharge Placement                       Discharge Plan and Services   Discharge Planning Services: CM Consult            DME Arranged: N/A DME Agency: NA       HH Arranged: NA HH Agency: NA        Social Determinants of Health (SDOH) Interventions     Readmission Risk Interventions No flowsheet data found.

## 2021-05-01 NOTE — Discharge Summary (Signed)
Physician Discharge Summary  Eric [name]. [name] DOB: 03-Nov-1927 DOA: 04/27/2021  PCP: Eric Frizzle, MD  Admit date: 04/27/2021 Discharge date: 05/01/2021  Admitted From: home Disposition:  home  Recommendations for Outpatient Follow-up:  Follow up with PCP in 1-2 weeks Please obtain BMP/CBC in one week Please follow up on the following pending results:  Home Health:no  Equipment/Devices: none  Discharge Condition: Stable Code Status:   Code Status: Full Code Diet recommendation:  Diet Order             Diet regular Room service appropriate? Yes; Fluid consistency: Thin  Diet effective now                    Brief/Interim Summary: a 87 y.o. male with medical history significant of diverticulosis, permanent Afib on Eliquis, CAD status post CABG, CKD stage III, diet-controlled type 2 diabetes, OSA, PAD status post left BKA, hypertension, hyperlipidemia, BPH presented to the ED for evaluation of multiple episodes of bright red blood per rectum x3 days associated with moderate lower abdominal pain. -Hemoglobin 9.6 on admission from baseline around 11, WBC was 12.6, creatinine 2.1 up from baseline of 1.1, EDP discussed with gastroenterology, plan for colonoscopy, CT Abd deferred in the setting of AKI. Patient was seen by GI underwent colonoscopy 9/19-severe diverticulosis and had 5 polyps that were resected advised to Eliquis for 1 week.  Hemoglobin was drifting down 7.2 g received PRBC transfusion and appropriately increased to 9.2 g.  Seen by PT OT no further needs.  Cleared for discharge to home and follow-up CBC BMP in 1 week.  Discharge Diagnoses:  Hematochezia, diverticular bleed Acute blood loss anemia - had colonoscopy 9/19 noted severe diverticulosis also had 5 polyps that were resected. Seen by GI underwent further 1 unit PRBC hemoglobin 9.2 g okay for discharge as per GI.  No more bleeding.  To hold Eliquis for 1 week.  Also given iron.  AKI   Metabolic acidosis Hyperkalemia -AKI likely prerenal due to GI bleeding baseline 1.1.  Now stable at 1.4 g .  Hyperkalemia  resolved Hold lisinopril and on as needed Lasix f at home, ollow-up BMP in 1 week Recent Labs  Lab 04/28/21 0424 04/29/21 0709 04/29/21 1609 04/30/21 0317 05/01/21 0443  BUN 39* 27* 26* 22 21  CREATININE 1.81* 1.46* 1.49* 1.49* 1.48*    Permanent A. Fib: Hold Eliquis for 1 wk per last GI note on 9/21   CAD status post CABG on statin, atenolol Diet-controlled type 2 diabetes:A1c 5.7 on 12/31/2020. Hypertension: on atenolol, can resume hydralazine and amlodipine at home but need to monitor blood pressure every day and may need to hold his meds if blood pressure less than 110 or 100, lisinopril on hold-resume after following up with PCP Hyperlipidemia on crestor BPH- cont finasteride and tamsulosin Peripheral vascular disease Left BKA  Pressure Ulcer: Pressure Injury 12/23/17 Stage II -  Partial thickness loss of dermis presenting as a shallow open ulcer with a red, pink wound bed without slough. stage 2 10cm x.5 (Active)  12/23/17 0115  Location: Sacrum  Location Orientation:   Staging: Stage II -  Partial thickness loss of dermis presenting as a shallow open ulcer with a red, pink wound bed without slough.  Wound Description (Comments): stage 2 10cm x.5  Present on Admission: Yes     Pressure Injury 12/23/17 Deep Tissue Injury - Purple or maroon localized area of discolored intact skin or blood-filled blister due to damage  of underlying soft tissue from pressure and/or shear. (Active)  12/23/17 0117  Location: Foot  Location Orientation: Right  Staging: Deep Tissue Injury - Purple or maroon localized area of discolored intact skin or blood-filled blister due to damage of underlying soft tissue from pressure and/or shear.  Wound Description (Comments):   Present on Admission: Yes    Consults: GI  Subjective: Alert,awake,resting No new  complaints Feels ready for home today. No more bleeding  Discharge Exam: Vitals:   05/01/21 0559 05/01/21 0909  BP: (!) 146/61 126/69  Pulse: (!) 54 (!) 106  Resp: 18 17  Temp: 98.7 F (37.1 C) 98 F (36.7 C)  SpO2: 98% 100%   General: Pt is alert, awake, not in acute distress Cardiovascular: RRR, S1/S2 +, no rubs, no gallops Respiratory: CTA bilaterally, no wheezing, no rhonchi Abdominal: Soft, NT, ND, bowel sounds + Extremities: no edema, no cyanosis  Discharge Instructions  Discharge Instructions     Discharge instructions   Complete by: As directed    Resume your Eliquis on 9/26.  Follow-up with PCP to check CBC and BMP in 1 week. Monitor blood pressure at home and if blood pressure is less than 081 systolic consider cutting back on your antihypertensives, discuss with your primary care doctor  Please call call MD or return to ER for similar or worsening recurring problem that brought you to hospital or if any fever,nausea/vomiting,abdominal pain, uncontrolled pain, chest pain,  shortness of breath or any other alarming symptoms.  Please follow-up your doctor as instructed in a week time and call the office for appointment.  Please avoid alcohol, smoking, or any other illicit substance and maintain healthy habits including taking your regular medications as prescribed.  You were cared for by a hospitalist during your hospital stay. If you have any questions about your discharge medications or the care you received while you were in the hospital after you are discharged, you can call the unit and ask to speak with the hospitalist on call if the hospitalist that took care of you is not available.  Once you are discharged, your primary care physician will handle any further medical issues. Please note that NO REFILLS for any discharge medications will be authorized once you are discharged, as it is imperative that you return to your primary care physician (or establish a  relationship with a primary care physician if you do not have one) for your aftercare needs so that they can reassess your need for medications and monitor your lab values   Increase activity slowly   Complete by: As directed       Allergies as of 05/01/2021       Reactions   Isosorbide Mononitrate Other (See Comments)   Unknown, doesn't remember    Ramipril Cough, Other (See Comments)   Quinidine Gluconate Rash   Quinidine Gluconate Other (See Comments), Rash        Medication List     STOP taking these medications    lisinopril 40 MG tablet Commonly known as: ZESTRIL       TAKE these medications    acetaminophen 325 MG tablet Commonly known as: TYLENOL Take 2 tablets (650 mg total) by mouth every 6 (six) hours as needed for mild pain (or temp >/= 101 F).   amLODipine 10 MG tablet Commonly known as: NORVASC Take 1 tablet (10 mg total) by mouth daily.   apixaban 5 MG Tabs tablet Commonly known as: Eliquis Take 1 tablet (5 mg total)  by mouth 2 (two) times daily. Resume on 9/26 Start taking on: May 05, 2021 What changed:  how much to take additional instructions These instructions start on May 05, 2021. If you are unsure what to do until then, ask your doctor or other care provider.   atenolol 25 MG tablet Commonly known as: TENORMIN Take 0.5 tablets (12.5 mg total) by mouth daily. What changed: when to take this   B Complex-B12 Tabs Take 1 tablet by mouth daily.   cholecalciferol 1000 units tablet Commonly known as: VITAMIN D Take 1,000 Units by mouth 2 (two) times a week. Takes on Mon and Fri   Cinnamon 500 MG capsule Take 1,000 mg by mouth 2 (two) times daily.   DAILY MULTIVITAMIN PO Take 1 tablet by mouth daily.   ferrous sulfate 325 (65 FE) MG EC tablet Take 325 mg by mouth at bedtime.   finasteride 5 MG tablet Commonly known as: PROSCAR TAKE 1 TABLET BY MOUTH ONCE DAILY   fish oil-omega-3 fatty acids 1000 MG capsule Take 1,000  mg by mouth in the morning and at bedtime.   furosemide 40 MG tablet Commonly known as: LASIX TAKE 1 TABLET BY MOUTH ONCE DAILY AS NEEDED FOR  LEG  SWELLING What changed: See the new instructions.   hydrALAZINE 25 MG tablet Commonly known as: APRESOLINE TAKE 3 TABLETS BY MOUTH EVERY 8 HOURS (NEEDS  OFFICE  VISIT  BEFORE  ANY  FURTHER  REFILLS) What changed: See the new instructions.   OneTouch Ultra test strip Generic drug: glucose blood USE 1 STRIP TO CHECK GLUCOSE ONCE DAILY   pentoxifylline 400 MG CR tablet Commonly known as: TRENTAL TAKE 1 TABLET BY MOUTH EVERY 12 HOURS What changed: when to take this   rosuvastatin 40 MG tablet Commonly known as: CRESTOR TAKE 1 TABLET BY MOUTH ONCE DAILY (CALL  CLINIC  TO  SCHEDULE  AN  APPOINTMENT  FOR  FUTURE  REFILLS) What changed: See the new instructions.   saccharomyces boulardii 250 MG capsule Commonly known as: Florastor Take 1 capsule (250 mg total) by mouth daily.   tamsulosin 0.4 MG Caps capsule Commonly known as: FLOMAX Take 1 capsule by mouth once daily        Follow-up Information     Eric Frizzle, MD Follow up in 1 week(s).   Specialty: Family Medicine Why: For CBC and BMP check Contact information: [address] [place] [address] [phone]                Allergies  Allergen Reactions   Isosorbide Mononitrate Other (See Comments)    Unknown, doesn't remember    Ramipril Cough and Other (See Comments)   Quinidine Gluconate Rash   Quinidine Gluconate Other (See Comments) and Rash    The results of significant diagnostics from this hospitalization (including imaging, microbiology, ancillary and laboratory) are listed below for reference.    Microbiology: Recent Results (from the past 240 hour(s))  SARS CORONAVIRUS 2 (TAT 6-24 HRS) Nasopharyngeal Nasopharyngeal Swab     Status: None   Collection Time: 04/27/21 10:32 PM   Specimen: Nasopharyngeal Swab  Result Value Ref Range  Status   SARS Coronavirus 2 NEGATIVE NEGATIVE Final    Comment: (NOTE) SARS-CoV-2 target nucleic acids are NOT DETECTED.  The SARS-CoV-2 RNA is generally detectable in upper and lower respiratory specimens during the acute phase of infection. Negative results do not preclude SARS-CoV-2 infection, do not rule out co-infections with other pathogens,  and should not be used as the sole basis for treatment or other patient management decisions. Negative results must be combined with clinical observations, patient history, and epidemiological information. The expected result is Negative.  Fact Sheet for Patients: SugarRoll.be  Fact Sheet for Healthcare Providers: https://www.woods-mathews.com/  This test is not yet approved or cleared by the [place] FDA and  has been authorized for detection and/or diagnosis of SARS-CoV-2 by FDA under an Emergency Use Authorization (EUA). This EUA will remain  in effect (meaning this test can be used) for the duration of the COVID-19 declaration under Se ction 564(b)(1) of the Act, 21 U.S.C. section 360bbb-3(b)(1), unless the authorization is terminated or revoked sooner.  Performed at [facility], [place] [address], [place], [place] [address]     Procedures/Studies: No results found.  Labs: BNP (last 3 results) No results for input(s): BNP in the last 8760 hours. Basic Metabolic Panel: Recent Labs  Lab 04/28/21 0424 04/29/21 0709 04/29/21 1609 04/30/21 0317 05/01/21 0443  NA 135 137 136 136 138  K 4.7 4.6 5.1 5.0 4.7  CL 108 111 108 110 112*  CO2 18* 19* 20* 20* 21*  GLUCOSE 98 93 129* 102* 90  BUN 39* 27* 26* 22 21  CREATININE 1.81* 1.46* 1.49* 1.49* 1.48*  CALCIUM 8.7* 8.6* 8.7* 8.5* 8.5*   Liver Function Tests: Recent Labs  Lab 04/27/21 1932 04/29/21 1609  AST 44* 32  ALT 37 28  ALKPHOS 52 52  BILITOT 0.5 0.4  PROT 6.3* 5.2*  ALBUMIN 2.8* 2.7*   Recent Labs  Lab  04/27/21 1932  LIPASE 25   No results for input(s): AMMONIA in the last 168 hours. CBC: Recent Labs  Lab 04/27/21 1932 04/27/21 2327 04/28/21 1831 04/29/21 0709 04/29/21 1435 04/30/21 0317 05/01/21 0443  WBC 12.6*   < > 9.5 8.0 9.2 8.8 11.6*  NEUTROABS 8.0*  --   --   --   --   --   --   HGB 9.6*   < > 8.3* 7.9* 7.9* 7.2* 9.2*  HCT 30.1*   < > 26.4* 24.5* 25.2* 22.4* 28.7*  MCV 96.2   < > 95.0 94.2 96.2 95.3 93.2  PLT 178   < > 164 169 190 163 190   < > = values in this interval not displayed.   Cardiac Enzymes: No results for input(s): CKTOTAL, CKMB, CKMBINDEX, TROPONINI in the last 168 hours. BNP: Invalid input(s): POCBNP CBG: No results for input(s): GLUCAP in the last 168 hours. D-Dimer No results for input(s): DDIMER in the last 72 hours. Hgb A1c No results for input(s): HGBA1C in the last 72 hours. Lipid Profile No results for input(s): CHOL, HDL, LDLCALC, TRIG, CHOLHDL, LDLDIRECT in the last 72 hours. Thyroid function studies No results for input(s): TSH, T4TOTAL, T3FREE, THYROIDAB in the last 72 hours.  Invalid input(s): FREET3 Anemia work up Recent Labs    04/29/21 1435 04/29/21 1609  VITAMINB12 722  --   FOLATE 27.4  --   FERRITIN 89 93  TIBC 190*  --   IRON 35*  --   RETICCTPCT 2.1  --    Urinalysis    Component Value Date/Time   COLORURINE BROWN (A) 07/24/2020 1006   APPEARANCEUR Cloudy (A) 03/12/2021 1147   LABSPEC 1.015 07/24/2020 1006   PHURINE 5.5 07/24/2020 1006   GLUCOSEU Negative 03/12/2021 1147   HGBUR 3+ (A) 07/24/2020 1006   HGBUR trace-lysed 02/07/2008 1101   BILIRUBINUR Negative 03/12/2021 1147  KETONESUR NEGATIVE 07/24/2020 1006   PROTEINUR 1+ (A) 03/12/2021 1147   PROTEINUR 3+ (A) 07/24/2020 1006   UROBILINOGEN 0.2 02/07/2008 1101   NITRITE Positive (A) 03/12/2021 1147   NITRITE NEGATIVE 07/24/2020 1006   LEUKOCYTESUR 3+ (A) 03/12/2021 1147   LEUKOCYTESUR 1+ (A) 07/24/2020 1006   Sepsis Labs Invalid input(s):  PROCALCITONIN,  WBC,  LACTICIDVEN Microbiology Recent Results (from the past 240 hour(s))  SARS CORONAVIRUS 2 (TAT 6-24 HRS) Nasopharyngeal Nasopharyngeal Swab     Status: None   Collection Time: 04/27/21 10:32 PM   Specimen: Nasopharyngeal Swab  Result Value Ref Range Status   SARS Coronavirus 2 NEGATIVE NEGATIVE Final    Comment: (NOTE) SARS-CoV-2 target nucleic acids are NOT DETECTED.  The SARS-CoV-2 RNA is generally detectable in upper and lower respiratory specimens during the acute phase of infection. Negative results do not preclude SARS-CoV-2 infection, do not rule out co-infections with other pathogens, and should not be used as the sole basis for treatment or other patient management decisions. Negative results must be combined with clinical observations, patient history, and epidemiological information. The expected result is Negative.  Fact Sheet for Patients: SugarRoll.be  Fact Sheet for Healthcare Providers: https://www.woods-mathews.com/  This test is not yet approved or cleared by the [place] FDA and  has been authorized for detection and/or diagnosis of SARS-CoV-2 by FDA under an Emergency Use Authorization (EUA). This EUA will remain  in effect (meaning this test can be used) for the duration of the COVID-19 declaration under Se ction 564(b)(1) of the Act, 21 U.S.C. section 360bbb-3(b)(1), unless the authorization is terminated or revoked sooner.  Performed at [facility], [place] [address] [place],  [address]      Time coordinating discharge: 25 minutes  SIGNED: Antonieta Pert, MD  [facility] 05/01/2021, 9:32 AM  If 7PM-7AM, please contact night-coverage [email or web address]

## 2021-05-01 NOTE — Telephone Encounter (Signed)
Transition Care Management Follow-up Telephone Call Date of discharge and from where: [facility] Diagnosis: ACUTE GI BLEED How have you been since you were released from the hospital? Pt states he is doing better. Any questions or concerns? No  Items Reviewed: Did the pt receive and understand the discharge instructions provided? Yes  Medications obtained and verified? Yes  Other? No  Any new allergies since your discharge? No  Dietary orders reviewed? Yes Do you have support at home? Yes   Home Care and Equipment/Supplies: Were home health services ordered? no If so, what is the name of the agency? N/A  Has the agency set up a time to come to the patient's home? not applicable Were any new equipment or medical supplies ordered?  No What is the name of the medical supply agency? N/A Were you able to get the supplies/equipment? not applicable Do you have any questions related to the use of the equipment or supplies? No  Functional Questionnaire: (I = Independent and D = Dependent) ADLs: I  Bathing/Dressing- I  Meal Prep- I  Eating- I  Maintaining continence- I  Transferring/Ambulation- I  Managing Meds- I  Follow up appointments reviewed:  PCP Hospital f/u appt confirmed? Yes  Scheduled to see [name] on 05/08/21 @ 11:15. [facility] f/u appt confirmed?  N/A   Are transportation arrangements needed? No  If their condition worsens, is the pt aware to call PCP or go to the Emergency Dept.? Yes Was the patient provided with contact information for the PCP's office or ED? Yes Was to pt encouraged to call back with questions or concerns? Yes

## 2021-05-01 NOTE — Progress Notes (Addendum)
Pt is discharged to home. DC instructions given with son at bedside. No concerns voiced. Ptr left unit in wheelchair pushed by a hospital volunteer. Left in stable condition.  Hale Bogus.

## 2021-05-08 ENCOUNTER — Other Ambulatory Visit: Payer: Self-pay

## 2021-05-08 ENCOUNTER — Ambulatory Visit (INDEPENDENT_AMBULATORY_CARE_PROVIDER_SITE_OTHER): Payer: PPO | Admitting: Family Medicine

## 2021-05-08 VITALS — BP 110/58 | HR 78 | Temp 97.2°F | Ht 65.0 in | Wt 163.0 lb

## 2021-05-08 DIAGNOSIS — K5790 Diverticulosis of intestine, part unspecified, without perforation or abscess without bleeding: Secondary | ICD-10-CM | POA: Diagnosis not present

## 2021-05-08 DIAGNOSIS — I482 Chronic atrial fibrillation, unspecified: Secondary | ICD-10-CM | POA: Diagnosis not present

## 2021-05-08 DIAGNOSIS — K5731 Diverticulosis of large intestine without perforation or abscess with bleeding: Secondary | ICD-10-CM

## 2021-05-08 NOTE — Progress Notes (Signed)
Subjective:    Patient ID: Eric [name]., male    DOB: Nov 12, 1927, 87 y.o.   MRN: [id number]  HPI  Patient is a very pleasant 87 year old Caucasian gentleman with a history of a below the knee amputation in the left leg due to a nonhealing ulcer on his left foot and osteomyelitis.  He also has diabetes mellitus.  He was recently admitted to the hospital with acute lower GI bleed.  I have copied the discharge summary below for my reference:  Admit date: 04/27/2021 Discharge date: 05/01/2021   Admitted From: home Disposition:  home   Recommendations for Outpatient Follow-up:  Follow up with PCP in 1-2 weeks Please obtain BMP/CBC in one week Please follow up on the following pending results:   Home Health:no  Equipment/Devices: none   Discharge Condition: Stable Code Status:   Code Status: Full Code Diet recommendation:  Diet Order                  Diet regular Room service appropriate? Yes; Fluid consistency: Thin  Diet effective now                         Brief/Interim Summary: a 87 y.o. male with medical history significant of diverticulosis, permanent Afib on Eliquis, CAD status post CABG, CKD stage III, diet-controlled type 2 diabetes, OSA, PAD status post left BKA, hypertension, hyperlipidemia, BPH presented to the ED for evaluation of multiple episodes of bright red blood per rectum x3 days associated with moderate lower abdominal pain. -Hemoglobin 9.6 on admission from baseline around 11, WBC was 12.6, creatinine 2.1 up from baseline of 1.1, EDP discussed with gastroenterology, plan for colonoscopy, CT Abd deferred in the setting of AKI. Patient was seen by GI underwent colonoscopy 9/19-severe diverticulosis and had 5 polyps that were resected advised to Eliquis for 1 week.  Hemoglobin was drifting down 7.2 g received PRBC transfusion and appropriately increased to 9.2 g.  Seen by PT OT no further needs.  Cleared for discharge to home and follow-up CBC BMP in 1  week.   Discharge Diagnoses:  Hematochezia, diverticular bleed Acute blood loss anemia - had colonoscopy 9/19 noted severe diverticulosis also had 5 polyps that were resected. Seen by GI underwent further 1 unit PRBC hemoglobin 9.2 g okay for discharge as per GI.  No more bleeding.  To hold Eliquis for 1 week.  Also given iron.   AKI  Metabolic acidosis Hyperkalemia -AKI likely prerenal due to GI bleeding baseline 1.1.  Now stable at 1.4 g .  Hyperkalemia  resolved Hold lisinopril and on as needed Lasix f at home, ollow-up BMP in 1 week Last Labs          Recent Labs  Lab 04/28/21 0424 04/29/21 0709 04/29/21 1609 04/30/21 0317 05/01/21 0443  BUN 39* 27* 26* 22 21  CREATININE 1.81* 1.46* 1.49* 1.49* 1.48*      Permanent A. Fib: Hold Eliquis for 1 wk per last GI note on 9/21   CAD status post CABG on statin, atenolol Diet-controlled type 2 diabetes:A1c 5.7 on 12/31/2020. Hypertension: on atenolol, can resume hydralazine and amlodipine at home but need to monitor blood pressure every day and may need to hold his meds if blood pressure less than 110 or 100, lisinopril on hold-resume after following up with PCP Hyperlipidemia on crestor BPH- cont finasteride and tamsulosin Peripheral vascular disease Left BKA   Pressure Ulcer: Pressure Injury 12/23/17 Stage II -  Partial thickness loss of dermis presenting as a shallow open ulcer with a red, pink wound bed without slough. stage 2 10cm x.5 (Active)  12/23/17 0115  Location: Sacrum  Location Orientation:   Staging: Stage II -  Partial thickness loss of dermis presenting as a shallow open ulcer with a red, pink wound bed without slough.  Wound Description (Comments): stage 2 10cm x.5  Present on Admission: Yes     Pressure Injury 12/23/17 Deep Tissue Injury - Purple or maroon localized area of discolored intact skin or blood-filled blister due to damage of underlying soft tissue from pressure and/or shear. (Active)  12/23/17 0117   Location: Foot  Location Orientation: Right  Staging: Deep Tissue Injury - Purple or maroon localized area of discolored intact skin or blood-filled blister due to damage of underlying soft tissue from pressure and/or shear.  Wound Description (Comments):   Present on Admission: Yes      Consults: GI  He resumed his Eliquis yesterday.  He denies any melena or hematochezia.  He denies any lower abdominal pain.  He denies any chest pain or shortness of breath or dyspnea on exertion.  No one has checked a CBC since his transfusion.  Hemoglobin had dropped to 7.2 and was up to 9.2 after transfusion when he was discharged from the hospital.  He denies any pleurisy.  He denies any cough.  He denies any strokelike symptoms off the Eliquis. Past Medical History:  Diagnosis Date   Atrial flutter ([place])    s/p RFCA   CAD (coronary artery disease)    cath 2003, occluded S-RCA, occluded S-Dx, L-LAD ok, s/p PTCA to LAD   Cataract    CKD (chronic kidney disease) stage 3, GFR 30-59 ml/min (HCC)    Diabetes mellitus    diet controlled   Hearing aid worn    bilateral   History of kidney stones    Long term (current) use of anticoagulants    Neuromuscular disorder (HCC)    OSA (obstructive sleep apnea) 12/11   very mild, AHI 7/hr   Persistent atrial fibrillation ([place]) 09/26/2015   Pure hyperglyceridemia    PVD (peripheral vascular disease) ([place])    angioplasty of his right lower extremity in [place] by Dr.[name] 2013   Unspecified essential hypertension    Wears dentures    full upper and lower   Past Surgical History:  Procedure Laterality Date   ABDOMINAL AORTOGRAM W/LOWER EXTREMITY N/A 11/15/2017   Procedure: ABDOMINAL AORTOGRAM W/LOWER EXTREMITY;  Surgeon: [name], MD;  Location: [place] [facility];  Service: Cardiovascular;  Laterality: N/A;   Adenosine Myoview  3/06   EF 56%, neg. Ischemia   Adenosine Myoview  02/18/07   nml   AMPUTATION Left 12/15/2017   Procedure: AMPUTATION  BELOW KNEE;  Surgeon: Algernon [name], MD;  Location: [facility];  Service: Vascular;  Laterality: Left;   ANGIOPLASTY  1/99   CAD- diogonal with rotational artherectomy   Arthrectomy     of LAD & PTCA   BLEPHAROPLASTY Bilateral    CARDIAC CATHETERIZATION  1/00   CARDIOVERSION  1/04   CARDIOVERSION  5/07   hospital- a flutter   CARPAL TUNNEL RELEASE     ? bilateral   CATARACT EXTRACTION W/PHACO Right 07/28/2017   Procedure: CATARACT EXTRACTION PHACO AND INTRAOCULAR LENS PLACEMENT ([place]) RIGHT DIABETIC;  Surgeon: [name], MD;  Location: [place];  Service: Ophthalmology;  Laterality: Right;  Diabetic - diet controlled   CATARACT EXTRACTION  W/PHACO Left 08/18/2017   Procedure: CATARACT EXTRACTION PHACO AND INTRAOCULAR LENS PLACEMENT (I[facility]);  Surgeon: [name], MD;  Location: [place];  Service: Ophthalmology;  Laterality: Left;  DIABETES - oral meds   COLONOSCOPY W/ BIOPSIES  10/01/06   sigmoid polyp bx neg, 3 years   COLONOSCOPY WITH PROPOFOL N/A 04/28/2021   Procedure: COLONOSCOPY WITH PROPOFOL;  Surgeon: Gatha Mayer, MD;  Location: [facility];  Service: Endoscopy;  Laterality: N/A;   CORONARY ANGIOPLASTY  4/03   cutting balloon PTCA pLAD into Diag   CORONARY ARTERY BYPASS GRAFT  2000   LIMA-LAD, SVG-RCA, SVG-Diag; SVG-Diag & SVG-RCA occluded 2003   FRACTURE SURGERY     HAND SURGERY  08/27/09   R thumb procedure wit Scaphoid Gragt and screws, Dr [name]   LOWER EXTREMITY ANGIOGRAPHY Right 01/25/2019   Procedure: LOWER EXTREMITY ANGIOGRAPHY;  Surgeon: Katha Cabal, MD;  Location: [place] [facility];  Service: Cardiovascular;  Laterality: Right;   NM MYOVIEW LTD  4/11   normal   PERIPHERAL VASCULAR CATHETERIZATION Right 06/27/2015   Procedure: Lower Extremity Angiography;  Surgeon: Algernon [name], MD;  Location: [place] [facility];  Service: Cardiovascular;  Laterality: Right;   PERIPHERAL VASCULAR CATHETERIZATION  06/27/2015   Procedure:  Lower Extremity Intervention;  Surgeon: Algernon [name], MD;  Location: [place] [facility];  Service: Cardiovascular;;   POLYPECTOMY  04/28/2021   Procedure: POLYPECTOMY;  Surgeon: Gatha Mayer, MD;  Location: [facility] ENDOSCOPY;  Service: Endoscopy;;   Current Outpatient Medications on File Prior to Visit  Medication Sig Dispense Refill   acetaminophen (TYLENOL) 325 MG tablet Take 2 tablets (650 mg total) by mouth every 6 (six) hours as needed for mild pain (or temp >/= 101 F).     amLODipine (NORVASC) 10 MG tablet Take 1 tablet (10 mg total) by mouth daily. 90 tablet 3   apixaban (ELIQUIS) 5 MG TABS tablet Take 1 tablet (5 mg total) by mouth 2 (two) times daily. Resume on 9/26 1 tablet 0   atenolol (TENORMIN) 25 MG tablet Take 0.5 tablets (12.5 mg total) by mouth daily. (Patient taking differently: Take 12.5 mg by mouth at bedtime.) 45 tablet 3   B Complex Vitamins (B COMPLEX-B12) TABS Take 1 tablet by mouth daily.      cholecalciferol (VITAMIN D) 1000 UNITS tablet Take 1,000 Units by mouth 2 (two) times a week. Takes on Mon and Fri     Cinnamon 500 MG capsule Take 1,000 mg by mouth 2 (two) times daily.     ferrous sulfate 325 (65 FE) MG EC tablet Take 325 mg by mouth at bedtime.     finasteride (PROSCAR) 5 MG tablet TAKE 1 TABLET BY MOUTH ONCE DAILY (Patient taking differently: Take 5 mg by mouth daily.) 90 tablet 3   fish oil-omega-3 fatty acids 1000 MG capsule Take 1,000 mg by mouth in the morning and at bedtime.     furosemide (LASIX) 40 MG tablet TAKE 1 TABLET BY MOUTH ONCE DAILY AS NEEDED FOR  LEG  SWELLING (Patient taking differently: Take 40 mg by mouth daily as needed for fluid or edema.) 30 tablet 0   hydrALAZINE (APRESOLINE) 25 MG tablet TAKE 3 TABLETS BY MOUTH EVERY 8 HOURS (NEEDS  OFFICE  VISIT  BEFORE  ANY  FURTHER  REFILLS) (Patient taking differently: Take 75 mg by mouth every 8 (eight) hours.) 270 tablet 0   Multiple Vitamin (DAILY MULTIVITAMIN PO) Take 1 tablet by mouth daily.  ONETOUCH ULTRA test strip USE 1 STRIP TO CHECK GLUCOSE ONCE DAILY 50 each 0   pentoxifylline (TRENTAL) 400 MG CR tablet TAKE 1 TABLET BY MOUTH EVERY 12 HOURS (Patient taking differently: Take 400 mg by mouth in the morning and at bedtime.) 180 tablet 0   rosuvastatin (CRESTOR) 40 MG tablet TAKE 1 TABLET BY MOUTH ONCE DAILY (CALL  CLINIC  TO  SCHEDULE  AN  APPOINTMENT  FOR  FUTURE  REFILLS) (Patient taking differently: Take 40 mg by mouth daily.) 90 tablet 3   saccharomyces boulardii (FLORASTOR) 250 MG capsule Take 1 capsule (250 mg total) by mouth daily. 90 capsule 3   tamsulosin (FLOMAX) 0.4 MG CAPS capsule Take 1 capsule by mouth once daily (Patient taking differently: Take 0.4 mg by mouth daily.) 90 capsule 0   No current facility-administered medications on file prior to visit.   Allergies  Allergen Reactions   Isosorbide Mononitrate Other (See Comments)    Unknown, doesn't remember    Ramipril Cough and Other (See Comments)   Quinidine Gluconate Rash   Quinidine Gluconate Other (See Comments) and Rash   Social History   Socioeconomic History   Marital status: Married    Spouse name: Not on file   Number of children: 5   Years of education: Not on file   Highest education level: Not on file  Occupational History   Occupation: [employer]    Employer: RETIRED  Tobacco Use   Smoking status: Never   Smokeless tobacco: Former    Quit date: 1994  Scientific laboratory technician Use: Never used  Substance and Sexual Activity   Alcohol use: No   Drug use: No   Sexual activity: Never  Other Topics Concern   Not on file  Social History Narrative   Retired now does some antiques   Retired from Theatre manager and dye work   Married 1951   5 kids   Social Determinants of Radio broadcast assistant Strain: Low Risk    Difficulty of Paying Living Expenses: Not very hard  Food Insecurity: Not on file  Transportation Needs: Not on file  Physical Activity: Not on file  Stress: Not on file   Social Connections: Not on file  Intimate Partner Violence: Not on file      Review of Systems  All other systems reviewed and are negative.     Objective:   Physical Exam Vitals reviewed.  Constitutional:      Appearance: Normal appearance.  Cardiovascular:     Rate and Rhythm: Normal rate. Rhythm irregular.     Pulses: Normal pulses.     Heart sounds: Normal heart sounds.  Pulmonary:     Effort: Pulmonary effort is normal.     Breath sounds: Normal breath sounds. No wheezing, rhonchi or rales.  Abdominal:     General: Abdomen is flat.     Palpations: Abdomen is soft.  Musculoskeletal:     Right lower leg: No edema.  Neurological:     Mental Status: He is alert.          Assessment & Plan:  Diverticulosis - Plan: CBC with Differential/Platelet, COMPLETE METABOLIC PANEL WITH GFR  Chronic atrial fibrillation (HCC)  Diverticulosis of colon with hemorrhage Complicated situation.  If hemoglobin is trending down from 9.2 in the hospital, obviously we will hold his Eliquis.  May need to revisit whether he is a good candidate for anticoagulation if this continues to drift down.  In  the meantime continue the Eliquis and check a CBC CMP.  Blood pressure today is soft so I will continue to hold the lisinopril.  Monitor for recovery of his renal function which was attributed to prerenal azotemia after acute blood loss.  Otherwise the patient states he is doing well with no complications since his hospitalization

## 2021-05-09 LAB — CBC WITH DIFFERENTIAL/PLATELET
Absolute Monocytes: 479 cells/uL (ref 200–950)
Basophils Absolute: 68 cells/uL (ref 0–200)
Basophils Relative: 0.6 %
Eosinophils Absolute: 148 cells/uL (ref 15–500)
Eosinophils Relative: 1.3 %
HCT: 30.5 % — ABNORMAL LOW (ref 38.5–50.0)
Hemoglobin: 9.8 g/dL — ABNORMAL LOW (ref 13.2–17.1)
Lymphs Abs: 2907 cells/uL (ref 850–3900)
MCH: 30.2 pg (ref 27.0–33.0)
MCHC: 32.1 g/dL (ref 32.0–36.0)
MCV: 93.8 fL (ref 80.0–100.0)
MPV: 10 fL (ref 7.5–12.5)
Monocytes Relative: 4.2 %
Neutro Abs: 7798 cells/uL (ref 1500–7800)
Neutrophils Relative %: 68.4 %
Platelets: 218 10*3/uL (ref 140–400)
RBC: 3.25 10*6/uL — ABNORMAL LOW (ref 4.20–5.80)
RDW: 15.4 % — ABNORMAL HIGH (ref 11.0–15.0)
Total Lymphocyte: 25.5 %
WBC: 11.4 10*3/uL — ABNORMAL HIGH (ref 3.8–10.8)

## 2021-05-09 LAB — COMPLETE METABOLIC PANEL WITH GFR
AG Ratio: 1.4 (calc) (ref 1.0–2.5)
ALT: 29 U/L (ref 9–46)
AST: 32 U/L (ref 10–35)
Albumin: 3.2 g/dL — ABNORMAL LOW (ref 3.6–5.1)
Alkaline phosphatase (APISO): 51 U/L (ref 35–144)
BUN/Creatinine Ratio: 13 (calc) (ref 6–22)
BUN: 19 mg/dL (ref 7–25)
CO2: 25 mmol/L (ref 20–32)
Calcium: 8.8 mg/dL (ref 8.6–10.3)
Chloride: 107 mmol/L (ref 98–110)
Creat: 1.49 mg/dL — ABNORMAL HIGH (ref 0.70–1.22)
Globulin: 2.3 g/dL (calc) (ref 1.9–3.7)
Glucose, Bld: 165 mg/dL — ABNORMAL HIGH (ref 65–99)
Potassium: 4.3 mmol/L (ref 3.5–5.3)
Sodium: 139 mmol/L (ref 135–146)
Total Bilirubin: 0.4 mg/dL (ref 0.2–1.2)
Total Protein: 5.5 g/dL — ABNORMAL LOW (ref 6.1–8.1)
eGFR: 44 mL/min/{1.73_m2} — ABNORMAL LOW (ref >=60)

## 2021-05-16 ENCOUNTER — Other Ambulatory Visit: Payer: Self-pay | Admitting: Family Medicine

## 2021-05-20 ENCOUNTER — Ambulatory Visit (INDEPENDENT_AMBULATORY_CARE_PROVIDER_SITE_OTHER): Payer: PPO | Admitting: Family Medicine

## 2021-05-20 ENCOUNTER — Other Ambulatory Visit: Payer: Self-pay

## 2021-05-20 VITALS — BP 112/58 | HR 61 | Temp 97.9°F | Ht 65.0 in | Wt 163.0 lb

## 2021-05-20 DIAGNOSIS — E11628 Type 2 diabetes mellitus with other skin complications: Secondary | ICD-10-CM | POA: Diagnosis not present

## 2021-05-20 DIAGNOSIS — Z23 Encounter for immunization: Secondary | ICD-10-CM | POA: Diagnosis not present

## 2021-05-20 DIAGNOSIS — L84 Corns and callosities: Secondary | ICD-10-CM | POA: Diagnosis not present

## 2021-05-20 NOTE — Progress Notes (Signed)
Subjective:    Patient ID: Eric [name]., male    DOB: [birth year], 87 y.o.   MRN: [id number]  HPI  Patient presents today with a preulcerative callus on the plantar aspect of his right fifth MTP joint.  Is approximately 1.5 cm in diameter.  It is a thick hyperkeratotic plaque-like callus.  There is capillary hemorrhage in the center.  There is no skin breakdown.  There is no evidence of any cellulitis or infection.  Patient has neuropathy and therefore he denies any pain.  He also reports bilateral hearing loss and he has bilateral cerumen impactions in both ears that he would like irrigated to remove. Past Medical History:  Diagnosis Date   Atrial flutter ([place])    s/p RFCA   CAD (coronary artery disease)    cath 2003, occluded S-RCA, occluded S-Dx, L-LAD ok, s/p PTCA to LAD   Cataract    CKD (chronic kidney disease) stage 3, GFR 30-59 ml/min (HCC)    Diabetes mellitus    diet controlled   Hearing aid worn    bilateral   History of kidney stones    Long term (current) use of anticoagulants    Neuromuscular disorder (HCC)    OSA (obstructive sleep apnea) 12/11   very mild, AHI 7/hr   Persistent atrial fibrillation ([place]) 09/26/2015   Pure hyperglyceridemia    PVD (peripheral vascular disease) ([place])    angioplasty of his right lower extremity in [place] by Dr.[name] 2013   Unspecified essential hypertension    Wears dentures    full upper and lower   Past Surgical History:  Procedure Laterality Date   ABDOMINAL AORTOGRAM W/LOWER EXTREMITY N/A 11/15/2017   Procedure: ABDOMINAL AORTOGRAM W/LOWER EXTREMITY;  Surgeon: [name], MD;  Location: [place] [facility];  Service: Cardiovascular;  Laterality: N/A;   Adenosine Myoview  3/06   EF 56%, neg. Ischemia   Adenosine Myoview  02/18/07   nml   AMPUTATION Left 12/15/2017   Procedure: AMPUTATION BELOW KNEE;  Surgeon: Algernon [name], MD;  Location: [facility];  Service: Vascular;  Laterality: Left;   ANGIOPLASTY  1/99   CAD-  diogonal with rotational artherectomy   Arthrectomy     of LAD & PTCA   BLEPHAROPLASTY Bilateral    CARDIAC CATHETERIZATION  1/00   CARDIOVERSION  1/04   CARDIOVERSION  5/07   hospital- a flutter   CARPAL TUNNEL RELEASE     ? bilateral   CATARACT EXTRACTION W/PHACO Right 07/28/2017   Procedure: CATARACT EXTRACTION PHACO AND INTRAOCULAR LENS PLACEMENT ([place]) RIGHT DIABETIC;  Surgeon: [name], MD;  Location: [place];  Service: Ophthalmology;  Laterality: Right;  Diabetic - diet controlled   CATARACT EXTRACTION W/PHACO Left 08/18/2017   Procedure: CATARACT EXTRACTION PHACO AND INTRAOCULAR LENS PLACEMENT ([facility]);  Surgeon: [name], MD;  Location: [place];  Service: Ophthalmology;  Laterality: Left;  DIABETES - oral meds   COLONOSCOPY W/ BIOPSIES  10/01/06   sigmoid polyp bx neg, 3 years   COLONOSCOPY WITH PROPOFOL N/A 04/28/2021   Procedure: COLONOSCOPY WITH PROPOFOL;  Surgeon: Gatha Mayer, MD;  Location: [facility] ENDOSCOPY;  Service: Endoscopy;  Laterality: N/A;   CORONARY ANGIOPLASTY  4/03   cutting balloon PTCA pLAD into Diag   CORONARY ARTERY BYPASS GRAFT  2000   LIMA-LAD, SVG-RCA, SVG-Diag; SVG-Diag & SVG-RCA occluded 2003   FRACTURE SURGERY     HAND SURGERY  08/27/09   R thumb procedure wit Scaphoid Gragt and  screws, Dr [name]   LOWER EXTREMITY ANGIOGRAPHY Right 01/25/2019   Procedure: LOWER EXTREMITY ANGIOGRAPHY;  Surgeon: Katha Cabal, MD;  Location: [place] [facility];  Service: Cardiovascular;  Laterality: Right;   NM MYOVIEW LTD  4/11   normal   PERIPHERAL VASCULAR CATHETERIZATION Right 06/27/2015   Procedure: Lower Extremity Angiography;  Surgeon: Algernon [name], MD;  Location: [place] [facility];  Service: Cardiovascular;  Laterality: Right;   PERIPHERAL VASCULAR CATHETERIZATION  06/27/2015   Procedure: Lower Extremity Intervention;  Surgeon: Algernon [name], MD;  Location: [place] [facility];  Service: Cardiovascular;;    POLYPECTOMY  04/28/2021   Procedure: POLYPECTOMY;  Surgeon: Gatha Mayer, MD;  Location: [facility] ENDOSCOPY;  Service: Endoscopy;;   Current Outpatient Medications on File Prior to Visit  Medication Sig Dispense Refill   acetaminophen (TYLENOL) 325 MG tablet Take 2 tablets (650 mg total) by mouth every 6 (six) hours as needed for mild pain (or temp >/= 101 F).     amLODipine (NORVASC) 10 MG tablet Take 1 tablet (10 mg total) by mouth daily. 90 tablet 3   apixaban (ELIQUIS) 5 MG TABS tablet Take 1 tablet (5 mg total) by mouth 2 (two) times daily. Resume on 9/26 1 tablet 0   atenolol (TENORMIN) 25 MG tablet Take 0.5 tablets (12.5 mg total) by mouth daily. (Patient taking differently: Take 12.5 mg by mouth at bedtime.) 45 tablet 3   B Complex Vitamins (B COMPLEX-B12) TABS Take 1 tablet by mouth daily.      cholecalciferol (VITAMIN D) 1000 UNITS tablet Take 1,000 Units by mouth 2 (two) times a week. Takes on Mon and Fri     Cinnamon 500 MG capsule Take 1,000 mg by mouth 2 (two) times daily.     ferrous sulfate 325 (65 FE) MG EC tablet Take 325 mg by mouth at bedtime.     finasteride (PROSCAR) 5 MG tablet TAKE 1 TABLET BY MOUTH ONCE DAILY (Patient taking differently: Take 5 mg by mouth daily.) 90 tablet 3   fish oil-omega-3 fatty acids 1000 MG capsule Take 1,000 mg by mouth in the morning and at bedtime.     furosemide (LASIX) 40 MG tablet TAKE 1 TABLET BY MOUTH ONCE DAILY AS NEEDED FOR  LEG  SWELLING (Patient taking differently: Take 40 mg by mouth daily as needed for fluid or edema.) 30 tablet 0   hydrALAZINE (APRESOLINE) 25 MG tablet Take 3 tablets (75 mg total) by mouth 3 (three) times daily. 270 tablet 0   Multiple Vitamin (DAILY MULTIVITAMIN PO) Take 1 tablet by mouth daily.     ONETOUCH ULTRA test strip USE 1 STRIP TO CHECK GLUCOSE ONCE DAILY 50 each 0   pentoxifylline (TRENTAL) 400 MG CR tablet TAKE 1 TABLET BY MOUTH EVERY 12 HOURS (Patient taking differently: Take 400 mg by mouth in the morning  and at bedtime.) 180 tablet 0   rosuvastatin (CRESTOR) 40 MG tablet TAKE 1 TABLET BY MOUTH ONCE DAILY (CALL  CLINIC  TO  SCHEDULE  AN  APPOINTMENT  FOR  FUTURE  REFILLS) (Patient taking differently: Take 40 mg by mouth daily.) 90 tablet 3   saccharomyces boulardii (FLORASTOR) 250 MG capsule Take 1 capsule (250 mg total) by mouth daily. 90 capsule 3   tamsulosin (FLOMAX) 0.4 MG CAPS capsule Take 1 capsule by mouth once daily (Patient taking differently: Take 0.4 mg by mouth daily.) 90 capsule 0   No current facility-administered medications on file prior to visit.  Allergies  Allergen Reactions   Isosorbide Mononitrate Other (See Comments)    Unknown, doesn't remember    Ramipril Cough and Other (See Comments)   Quinidine Gluconate Rash   Quinidine Gluconate Other (See Comments) and Rash   Social History   Socioeconomic History   Marital status: Married    Spouse name: Not on file   Number of children: 5   Years of education: Not on file   Highest education level: Not on file  Occupational History   Occupation: [employer]    Employer: RETIRED  Tobacco Use   Smoking status: Never   Smokeless tobacco: Former    Quit date: 1994  Scientific laboratory technician Use: Never used  Substance and Sexual Activity   Alcohol use: No   Drug use: No   Sexual activity: Never  Other Topics Concern   Not on file  Social History Narrative   Retired now does some antiques   Retired from Theatre manager and dye work   Married 1951   5 kids   Social Determinants of Radio broadcast assistant Strain: Low Risk    Difficulty of Paying Living Expenses: Not very hard  Food Insecurity: Not on file  Transportation Needs: Not on file  Physical Activity: Not on file  Stress: Not on file  Social Connections: Not on file  Intimate Partner Violence: Not on file      Review of Systems  All other systems reviewed and are negative.     Objective:   Physical Exam Vitals reviewed.  Constitutional:       Appearance: Normal appearance.  Cardiovascular:     Rate and Rhythm: Normal rate. Rhythm irregular.     Pulses: Normal pulses.     Heart sounds: Normal heart sounds.  Pulmonary:     Effort: Pulmonary effort is normal.     Breath sounds: Normal breath sounds. No wheezing, rhonchi or rales.  Abdominal:     General: Abdomen is flat.     Palpations: Abdomen is soft.  Musculoskeletal:     Right lower leg: No edema.       Feet:  Feet:     Right foot:     Skin integrity: No ulcer, blister or skin breakdown.  Neurological:     Mental Status: He is alert.          Assessment & Plan:  Immunization due - Plan: Flu Vaccine QUAD High Dose(Fluad)  Pre-ulcerative corn or callous  Type 2 diabetes mellitus with pressure callus (HCC)  Using a razor blade, I pared away the hyperkeratotic tissue to level the callus back with the surrounding skin.  I then applied Silvadene and a bandage.  Recommended that the patient contact his podiatrist as I believe that he would benefit from modification of his orthotic to remove a point of pressure.  We also removed the wax from his ear using irrigation and lavage.  He received his flu shot today.

## 2021-05-23 ENCOUNTER — Other Ambulatory Visit: Payer: Self-pay | Admitting: Family Medicine

## 2021-06-12 ENCOUNTER — Telehealth: Payer: PPO

## 2021-06-16 ENCOUNTER — Other Ambulatory Visit: Payer: Self-pay | Admitting: Family Medicine

## 2021-06-20 ENCOUNTER — Other Ambulatory Visit: Payer: Self-pay | Admitting: Family Medicine

## 2021-06-20 DIAGNOSIS — I10 Essential (primary) hypertension: Secondary | ICD-10-CM

## 2021-07-11 ENCOUNTER — Ambulatory Visit: Payer: PPO

## 2021-07-21 ENCOUNTER — Other Ambulatory Visit: Payer: Self-pay | Admitting: Family Medicine

## 2021-07-24 ENCOUNTER — Ambulatory Visit (INDEPENDENT_AMBULATORY_CARE_PROVIDER_SITE_OTHER): Payer: PPO

## 2021-07-24 ENCOUNTER — Telehealth: Payer: Self-pay

## 2021-07-24 ENCOUNTER — Other Ambulatory Visit: Payer: Self-pay

## 2021-07-24 ENCOUNTER — Telehealth: Payer: Self-pay | Admitting: Family Medicine

## 2021-07-24 VITALS — BP 124/62 | HR 63 | Ht 65.0 in | Wt 163.0 lb

## 2021-07-24 DIAGNOSIS — E11628 Type 2 diabetes mellitus with other skin complications: Secondary | ICD-10-CM | POA: Diagnosis not present

## 2021-07-24 DIAGNOSIS — Z0000 Encounter for general adult medical examination without abnormal findings: Secondary | ICD-10-CM

## 2021-07-24 DIAGNOSIS — I10 Essential (primary) hypertension: Secondary | ICD-10-CM

## 2021-07-24 DIAGNOSIS — L84 Corns and callosities: Secondary | ICD-10-CM | POA: Diagnosis not present

## 2021-07-24 DIAGNOSIS — H539 Unspecified visual disturbance: Secondary | ICD-10-CM | POA: Diagnosis not present

## 2021-07-24 NOTE — Telephone Encounter (Signed)
Patient returned missed call to nurse.   Please advise at [phone].

## 2021-07-24 NOTE — Patient Instructions (Signed)
Eric [name] , Thank you for taking time to come for your Medicare Wellness Visit. I appreciate your ongoing commitment to your health goals. Please review the following plan we discussed and let me know if I can assist you in the future.   Screening recommendations/referrals: Colonoscopy: Done 04/28/2021 Recommended yearly ophthalmology/optometry visit for glaucoma screening and checkup Recommended yearly dental visit for hygiene and checkup  Vaccinations: Influenza vaccine: Done 05/20/2021 Repeat annually  Pneumococcal vaccine: Done 10/17/2013 and 05/05/2014 Tdap vaccine: Done 04/14/2019 Shingles vaccine: Shingrix discussed. Please contact your pharmacy for coverage information.     Covid-19: Done 10/05/2019 and 10/20/2019  Advanced directives: Copy in chart.  Conditions/risks identified: Aim for 30 minutes of exercise or walking each day, drink 6-8 glasses of water and eat lots of fruits and vegetables.   Next appointment: Follow up in one year for your annual wellness visit. 2023.  Preventive Care 13 Years and Older, Male  Preventive care refers to lifestyle choices and visits with your health care provider that can promote health and wellness. What does preventive care include? A yearly physical exam. This is also called an annual well check. Dental exams once or twice a year. Routine eye exams. Ask your health care provider how often you should have your eyes checked. Personal lifestyle choices, including: Daily care of your teeth and gums. Regular physical activity. Eating a healthy diet. Avoiding tobacco and drug use. Limiting alcohol use. Practicing safe sex. Taking low doses of aspirin every day. Taking vitamin and mineral supplements as recommended by your health care provider. What happens during an annual well check? The services and screenings done by your health care provider during your annual well check will depend on your age, overall health, lifestyle risk factors,  and family history of disease. Counseling  Your health care provider may ask you questions about your: Alcohol use. Tobacco use. Drug use. Emotional well-being. Home and relationship well-being. Sexual activity. Eating habits. History of falls. Memory and ability to understand (cognition). Work and work Statistician. Screening  You may have the following tests or measurements: Height, weight, and BMI. Blood pressure. Lipid and cholesterol levels. These may be checked every 5 years, or more frequently if you are over 66 years old. Skin check. Lung cancer screening. You may have this screening every year starting at age 48 if you have a 30-pack-year history of smoking and currently smoke or have quit within the past 15 years. Fecal occult blood test (FOBT) of the stool. You may have this test every year starting at age 42. Flexible sigmoidoscopy or colonoscopy. You may have a sigmoidoscopy every 5 years or a colonoscopy every 10 years starting at age 89. Prostate cancer screening. Recommendations will vary depending on your family history and other risks. Hepatitis C blood test. Hepatitis B blood test. Sexually transmitted disease (STD) testing. Diabetes screening. This is done by checking your blood sugar (glucose) after you have not eaten for a while (fasting). You may have this done every 1-3 years. Abdominal aortic aneurysm (AAA) screening. You may need this if you are a current or former smoker. Osteoporosis. You may be screened starting at age 60 if you are at high risk. Talk with your health care provider about your test results, treatment options, and if necessary, the need for more tests. Vaccines  Your health care provider may recommend certain vaccines, such as: Influenza vaccine. This is recommended every year. Tetanus, diphtheria, and acellular pertussis (Tdap, Td) vaccine. You may need a Td booster  every 10 years. Zoster vaccine. You may need this after age  60. Pneumococcal 13-valent conjugate (PCV13) vaccine. One dose is recommended after age 19. Pneumococcal polysaccharide (PPSV23) vaccine. One dose is recommended after age 37. Talk to your health care provider about which screenings and vaccines you need and how often you need them. This information is not intended to replace advice given to you by your health care provider. Make sure you discuss any questions you have with your health care provider. Document Released: 08/23/2015 Document Revised: 04/15/2016 Document Reviewed: 05/28/2015 Elsevier Interactive Patient Education  2017 [place] Prevention in the Home Falls can cause injuries. They can happen to people of all ages. There are many things you can do to make your home safe and to help prevent falls. What can I do on the outside of my home? Regularly fix the edges of walkways and driveways and fix any cracks. Remove anything that might make you trip as you walk through a door, such as a raised step or threshold. Trim any bushes or trees on the path to your home. Use bright outdoor lighting. Clear any walking paths of anything that might make someone trip, such as rocks or tools. Regularly check to see if handrails are loose or broken. Make sure that both sides of any steps have handrails. Any raised decks and porches should have guardrails on the edges. Have any leaves, snow, or ice cleared regularly. Use sand or salt on walking paths during winter. Clean up any spills in your garage right away. This includes oil or grease spills. What can I do in the bathroom? Use night lights. Install grab bars by the toilet and in the tub and shower. Do not use towel bars as grab bars. Use non-skid mats or decals in the tub or shower. If you need to sit down in the shower, use a plastic, non-slip stool. Keep the floor dry. Clean up any water that spills on the floor as soon as it happens. Remove soap buildup in the tub or shower  regularly. Attach bath mats securely with double-sided non-slip rug tape. Do not have throw rugs and other things on the floor that can make you trip. What can I do in the bedroom? Use night lights. Make sure that you have a light by your bed that is easy to reach. Do not use any sheets or blankets that are too big for your bed. They should not hang down onto the floor. Have a firm chair that has side arms. You can use this for support while you get dressed. Do not have throw rugs and other things on the floor that can make you trip. What can I do in the kitchen? Clean up any spills right away. Avoid walking on wet floors. Keep items that you use a lot in easy-to-reach places. If you need to reach something above you, use a strong step stool that has a grab bar. Keep electrical cords out of the way. Do not use floor polish or wax that makes floors slippery. If you must use wax, use non-skid floor wax. Do not have throw rugs and other things on the floor that can make you trip. What can I do with my stairs? Do not leave any items on the stairs. Make sure that there are handrails on both sides of the stairs and use them. Fix handrails that are broken or loose. Make sure that handrails are as long as the stairways. Check any carpeting to  make sure that it is firmly attached to the stairs. Fix any carpet that is loose or worn. Avoid having throw rugs at the top or bottom of the stairs. If you do have throw rugs, attach them to the floor with carpet tape. Make sure that you have a light switch at the top of the stairs and the bottom of the stairs. If you do not have them, ask someone to add them for you. What else can I do to help prevent falls? Wear shoes that: Do not have high heels. Have rubber bottoms. Are comfortable and fit you well. Are closed at the toe. Do not wear sandals. If you use a stepladder: Make sure that it is fully opened. Do not climb a closed stepladder. Make sure that  both sides of the stepladder are locked into place. Ask someone to hold it for you, if possible. Clearly mark and make sure that you can see: Any grab bars or handrails. First and last steps. Where the edge of each step is. Use tools that help you move around (mobility aids) if they are needed. These include: Canes. Walkers. Scooters. Crutches. Turn on the lights when you go into a dark area. Replace any light bulbs as soon as they burn out. Set up your furniture so you have a clear path. Avoid moving your furniture around. If any of your floors are uneven, fix them. If there are any pets around you, be aware of where they are. Review your medicines with your doctor. Some medicines can make you feel dizzy. This can increase your chance of falling. Ask your doctor what other things that you can do to help prevent falls. This information is not intended to replace advice given to you by your health care provider. Make sure you discuss any questions you have with your health care provider. Document Released: 05/23/2009 Document Revised: 01/02/2016 Document Reviewed: 08/31/2014 Elsevier Interactive Patient Education  2017 [employer].

## 2021-07-24 NOTE — Progress Notes (Signed)
Subjective:   Eric [name]. is a 87 y.o. male who presents for Medicare Annual/Subsequent preventive examination.  Review of Systems     Cardiac Risk Factors include: advanced age (>76men, >73 women);diabetes mellitus;dyslipidemia;hypertension;sedentary lifestyle     Objective:    Today's Vitals   07/24/21 0946  BP: 124/62  Pulse: 63  SpO2: 99%  Weight: 163 lb (73.9 kg)  Height: 5\' 5"  (1.651 m)   Body mass index is 27.12 kg/m.  Advanced Directives 07/24/2021 04/28/2021 02/15/2020 03/08/2019 01/25/2019 06/07/2018 02/16/2018  Does Patient Have a Medical Advance Directive? Yes Yes No No No No No  Type of Paramedic of [place];Living will [place];Living will - - - - -  Does patient want to make changes to medical advance directive? - - - - - - Yes (MAU/Ambulatory/Procedural Areas - Information given)  Copy of [place] in Chart? No - copy requested - - - - - -  Would patient like information on creating a medical advance directive? - - No - Patient declined No - Patient declined No - Patient declined No - Patient declined -    Current Medications (verified) Outpatient Encounter Medications as of 07/24/2021  Medication Sig   acetaminophen (TYLENOL) 325 MG tablet Take 2 tablets (650 mg total) by mouth every 6 (six) hours as needed for mild pain (or temp >/= 101 F).   amLODipine (NORVASC) 10 MG tablet Take 1 tablet by mouth once daily   atenolol (TENORMIN) 25 MG tablet Take 1 tablet by mouth once daily   ELIQUIS 5 MG TABS tablet Take 1 tablet by mouth twice daily   furosemide (LASIX) 40 MG tablet TAKE 1 TABLET BY MOUTH ONCE DAILY AS NEEDED FOR  LEG  SWELLING   Multiple Vitamin (DAILY MULTIVITAMIN PO) Take 1 tablet by mouth daily.   pentoxifylline (TRENTAL) 400 MG CR tablet TAKE 1 TABLET BY MOUTH EVERY 12 HOURS (Patient taking differently: Take 400 mg by mouth in the morning and at bedtime.)   rosuvastatin (CRESTOR)  40 MG tablet TAKE 1 TABLET BY MOUTH ONCE DAILY -  NEED  APPT   tamsulosin (FLOMAX) 0.4 MG CAPS capsule Take 1 capsule by mouth once daily   B Complex Vitamins (B COMPLEX-B12) TABS Take 1 tablet by mouth daily.    cholecalciferol (VITAMIN D) 1000 UNITS tablet Take 1,000 Units by mouth 2 (two) times a week. Takes on Mon and Fri   Cinnamon 500 MG capsule Take 1,000 mg by mouth 2 (two) times daily.   ferrous sulfate 325 (65 FE) MG EC tablet Take 325 mg by mouth at bedtime. (Patient not taking: Reported on 07/24/2021)   finasteride (PROSCAR) 5 MG tablet TAKE 1 TABLET BY MOUTH ONCE DAILY (Patient not taking: Reported on 07/24/2021)   fish oil-omega-3 fatty acids 1000 MG capsule Take 1,000 mg by mouth in the morning and at bedtime.   hydrALAZINE (APRESOLINE) 25 MG tablet TAKE 3 TABLETS BY MOUTH THREE TIMES DAILY (Patient not taking: Reported on 07/24/2021)   ONETOUCH ULTRA test strip USE 1 STRIP TO CHECK GLUCOSE ONCE DAILY (Patient not taking: Reported on 07/24/2021)   saccharomyces boulardii (FLORASTOR) 250 MG capsule Take 1 capsule (250 mg total) by mouth daily.   No facility-administered encounter medications on file as of 07/24/2021.    Allergies (verified) Isosorbide mononitrate, Ramipril, Quinidine gluconate, and Quinidine gluconate   History: Past Medical History:  Diagnosis Date   Atrial flutter ([place])    s/p  RFCA   CAD (coronary artery disease)    cath 2003, occluded S-RCA, occluded S-Dx, L-LAD ok, s/p PTCA to LAD   Cataract    CKD (chronic kidney disease) stage 3, GFR 30-59 ml/min (HCC)    Diabetes mellitus    diet controlled   Hearing aid worn    bilateral   History of kidney stones    Long term (current) use of anticoagulants    Neuromuscular disorder (HCC)    OSA (obstructive sleep apnea) 12/11   very mild, AHI 7/hr   Persistent atrial fibrillation ([place]) 09/26/2015   Pure hyperglyceridemia    PVD (peripheral vascular disease) ([place])    angioplasty of his right lower  extremity in [place] by Dr.[name] 2013   Unspecified essential hypertension    Wears dentures    full upper and lower   Past Surgical History:  Procedure Laterality Date   ABDOMINAL AORTOGRAM W/LOWER EXTREMITY N/A 11/15/2017   Procedure: ABDOMINAL AORTOGRAM W/LOWER EXTREMITY;  Surgeon: [name], MD;  Location: [facility];  Service: Cardiovascular;  Laterality: N/A;   Adenosine Myoview  3/06   EF 56%, neg. Ischemia   Adenosine Myoview  02/18/07   nml   AMPUTATION Left 12/15/2017   Procedure: AMPUTATION BELOW KNEE;  Surgeon: Algernon [name], MD;  Location: [facility];  Service: Vascular;  Laterality: Left;   ANGIOPLASTY  1/99   CAD- diogonal with rotational artherectomy   Arthrectomy     of LAD & PTCA   BLEPHAROPLASTY Bilateral    CARDIAC CATHETERIZATION  1/00   CARDIOVERSION  1/04   CARDIOVERSION  5/07   hospital- a flutter   CARPAL TUNNEL RELEASE     ? bilateral   CATARACT EXTRACTION W/PHACO Right 07/28/2017   Procedure: CATARACT EXTRACTION PHACO AND INTRAOCULAR LENS PLACEMENT ([place]) RIGHT DIABETIC;  Surgeon: [name], MD;  Location: [facility];  Service: Ophthalmology;  Laterality: Right;  Diabetic - diet controlled   CATARACT EXTRACTION W/PHACO Left 08/18/2017   Procedure: CATARACT EXTRACTION PHACO AND INTRAOCULAR LENS PLACEMENT ([facility]);  Surgeon: [name], MD;  Location: [facility];  Service: Ophthalmology;  Laterality: Left;  DIABETES - oral meds   COLONOSCOPY W/ BIOPSIES  10/01/06   sigmoid polyp bx neg, 3 years   COLONOSCOPY WITH PROPOFOL N/A 04/28/2021   Procedure: COLONOSCOPY WITH PROPOFOL;  Surgeon: Gatha Mayer, MD;  Location: [facility];  Service: Endoscopy;  Laterality: N/A;   CORONARY ANGIOPLASTY  4/03   cutting balloon PTCA pLAD into Diag   CORONARY ARTERY BYPASS GRAFT  2000   LIMA-LAD, SVG-RCA, SVG-Diag; SVG-Diag & SVG-RCA occluded 2003   FRACTURE SURGERY     HAND SURGERY  08/27/09   R thumb procedure wit Scaphoid  Gragt and screws, Dr [name]   LOWER EXTREMITY ANGIOGRAPHY Right 01/25/2019   Procedure: LOWER EXTREMITY ANGIOGRAPHY;  Surgeon: Katha Cabal, MD;  Location: [facility];  Service: Cardiovascular;  Laterality: Right;   NM MYOVIEW LTD  4/11   normal   PERIPHERAL VASCULAR CATHETERIZATION Right 06/27/2015   Procedure: Lower Extremity Angiography;  Surgeon: Algernon [name], MD;  Location: [facility];  Service: Cardiovascular;  Laterality: Right;   PERIPHERAL VASCULAR CATHETERIZATION  06/27/2015   Procedure: Lower Extremity Intervention;  Surgeon: Algernon [name], MD;  Location: [facility];  Service: Cardiovascular;;   POLYPECTOMY  04/28/2021   Procedure: POLYPECTOMY;  Surgeon: Gatha Mayer, MD;  Location: [facility];  Service: Endoscopy;;   Family History  Problem Relation Age of Onset   Stroke Father    Aneurysm Father    Hypertension Father    Prostate cancer Brother    Hypertension Mother    Lung cancer Brother        smoker   Thrombosis Brother    Other Brother        RF valve disorder (smoker)   Lung cancer Brother        smoker   Breast cancer Sister    Liver cancer Brother    Other Sister        cerebral hemorrhage   Social History   Socioeconomic History   Marital status: Married    Spouse name: [name]   Number of children: 5   Years of education: Not on file   Highest education level: Not on file  Occupational History   Occupation: [employer]    Employer: RETIRED  Tobacco Use   Smoking status: Never   Smokeless tobacco: Former    Quit date: 1994  Scientific laboratory technician Use: Never used  Substance and Sexual Activity   Alcohol use: No   Drug use: No   Sexual activity: Never  Other Topics Concern   Not on file  Social History Narrative   Retired now does some Restaurant manager, 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 Married x 73 years in 2022.   5 kids   11 grandchildren.   20 great grandchilren.   Social Determinants of Health   Financial  Resource Strain: Low Risk    Difficulty of Paying Living Expenses: Not hard at all  Food Insecurity: No Food Insecurity   Worried About Charity fundraiser in the Last Year: Never true   [place] in the Last Year: Never true  Transportation Needs: No Transportation Needs   Lack of Transportation (Medical): No   Lack of Transportation (Non-Medical): No  Physical Activity: Insufficiently Active   Days of Exercise per Week: 3 days   Minutes of Exercise per Session: 20 min  Stress: No Stress Concern Present   Feeling of Stress : Not at all  Social Connections: Socially Integrated   Frequency of Communication with Friends and Family: More than three times a week   Frequency of Social Gatherings with Friends and Family: More than three times a week   Attends Religious Services: More than 4 times per year   Active Member of [employer] or Organizations: Yes   Attends Music therapist: More than 4 times per year   Marital Status: Married    Tobacco Counseling Counseling given: Not Answered   Clinical Intake:  Pre-visit preparation completed: Yes  Pain : No/denies pain     BMI - recorded: 27.12 Nutritional Status: BMI 25 -29 Overweight Nutritional Risks: None Diabetes: No  How often do you need to have someone help you when you read instructions, pamphlets, or other written materials from your doctor or pharmacy?: 1 - Never  Diabetic?Pt states he has been in the past but does not currently take medication for diabetes.   Interpreter Needed?: No  Information entered by :: mj Kamyra Schroeck,lpn   Activities of Daily Living In your present state of health, do you have any difficulty performing the following activities: 07/24/2021  Hearing? Y  Vision? N  Difficulty concentrating or making decisions? Y  Walking or climbing stairs? Y  Dressing or bathing? N  Doing errands, shopping? N  Preparing Food and eating ? N  Using the  Toilet? N  In the past six months, have  you accidently leaked urine? Y  Do you have problems with loss of bowel control? N  Managing your Medications? N  Managing your Finances? N  Housekeeping or managing your Housekeeping? N  Some recent data might be hidden    Patient Care Team: [name], MD as PCP - General (Family Medicine) Edythe Clarity, [facility] as Pharmacist (Pharmacist)  Indicate any recent Medical Services you may have received from other than [facility] providers in the past year (date may be approximate).     Assessment:   This is a routine wellness examination for Eric [name].  Hearing/Vision screen Hearing Screening - Comments:: Hearing aids. Vision Screening - Comments:: Glasses. 2020.  Dietary issues and exercise activities discussed: Current Exercise Habits: Home exercise routine, Type of exercise: walking, Time (Minutes): 20, Frequency (Times/Week): 3, Weekly Exercise (Minutes/Week): 60, Intensity: Mild, Exercise limited by: cardiac condition(s);orthopedic condition(s)   Goals Addressed             This Visit's Progress    Exercise 3x per week (30 min per time)       Try to increase exercise as tolerated.        Depression Screen PHQ 2/9 Scores 07/24/2021 05/20/2021 05/08/2021 12/31/2020 07/24/2020 10/24/2018 10/20/2018  PHQ - 2 Score 0 0 0 0 0 0 0  PHQ- 9 Score - - - - - 0 -    Fall Risk Fall Risk  07/24/2021 05/20/2021 05/08/2021 12/31/2020 07/24/2020  Falls in the past year? 0 0 0 1 0  Number falls in past yr: 0 0 0 0 0  Injury with Fall? 0 0 0 0 0  Risk Factor Category  - - - - -  Risk for fall due to : Impaired balance/gait No Fall Risks No Fall Risks Impaired balance/gait;Impaired mobility;History of fall(s) Impaired balance/gait  Risk for fall due to: Comment - - - - -  Follow up Falls prevention discussed Falls evaluation completed Falls evaluation completed Falls evaluation completed -    FALL RISK PREVENTION PERTAINING TO THE HOME:  Any stairs in or around the home? Yes  If so,  are there any without handrails? No  Home free of loose throw rugs in walkways, pet beds, electrical cords, etc? Yes  Adequate lighting in your home to reduce risk of falls? Yes   ASSISTIVE DEVICES UTILIZED TO PREVENT FALLS:  Life alert? No  Use of a cane, walker or w/c? Yes  Grab bars in the bathroom? Yes  Shower chair or bench in shower? Yes  Elevated toilet seat or a handicapped toilet? Yes   TIMED UP AND GO:  Was the test performed? Yes .  Length of time to ambulate 10 feet: 10 sec.   Gait slow and steady with assistive device  Cognitive Function:     6CIT Screen 07/24/2021  What Year? 0 points  What month? 0 points  What time? 0 points  Count back from 20 0 points  Months in reverse 4 points  Repeat phrase 2 points  Total Score 6    Immunizations Immunization History  Administered Date(s) Administered   Fluad Quad(high Dose 65+) 05/03/2019, 05/20/2021   Influenza Split 07/14/2011, 06/02/2012   Influenza Whole 05/24/2005, 05/20/2007, 05/21/2008, 07/19/2009, 05/28/2010   Influenza, High Dose Seasonal PF 05/03/2017   Influenza,inj,Quad PF,6+ Mos 04/11/2013, 04/20/2014, 05/06/2015, 06/02/2016, 05/13/2018   Influenza-Unspecified 05/03/2017   PFIZER(Purple Top)SARS-COV-2 Vaccination 10/05/2019, 10/20/2019   Pneumococcal Conjugate-13 05/06/2015   Pneumococcal Polysaccharide-23  10/17/2013   Td 03/13/2004   Tdap 04/14/2019    TDAP status: Up to date  Flu Vaccine status: Up to date  Pneumococcal vaccine status: Up to date  Covid-19 vaccine status: Completed vaccines  Qualifies for Shingles Vaccine? Yes   Zostavax completed No   Shingrix Completed?: No.    Education has been provided regarding the importance of this vaccine. Patient has been advised to call insurance company to determine out of pocket expense if they have not yet received this vaccine. Advised may also receive vaccine at local pharmacy or Health Dept. Verbalized acceptance and  understanding.  Screening Tests Health Maintenance  Topic Date Due   Zoster Vaccines- Shingrix (1 of 2) Never done   FOOT EXAM  03/01/2018   OPHTHALMOLOGY EXAM  03/30/2019   COVID-19 Vaccine (3 - Mixed Product risk series) 11/17/2019   HEMOGLOBIN A1C  07/03/2021   TETANUS/TDAP  04/13/2029   Pneumonia Vaccine 74+ Years old  Completed   INFLUENZA VACCINE  Completed   HPV VACCINES  Aged Out    Health Maintenance  Health Maintenance Due  Topic Date Due   Zoster Vaccines- Shingrix (1 of 2) Never done   FOOT EXAM  03/01/2018   OPHTHALMOLOGY EXAM  03/30/2019   COVID-19 Vaccine (3 - Mixed Product risk series) 11/17/2019   HEMOGLOBIN A1C  07/03/2021    Colorectal cancer screening: Type of screening: Colonoscopy. Completed 04/28/2021. Repeat every 10 years  Lung Cancer Screening: (Low Dose CT Chest recommended if Age 71-80 years, 30 pack-year currently smoking OR have quit w/in 15years.) does not qualify.    Additional Screening:  Hepatitis C Screening: does not qualify;   Vision Screening: Recommended annual ophthalmology exams for early detection of glaucoma and other disorders of the eye. Is the patient up to date with their annual eye exam?  No  Who is the provider or what is the name of the office in which the patient attends annual eye exams? [facility] If pt is not established with a provider, would they like to be referred to a provider to establish care? No .   Dental Screening: Recommended annual dental exams for proper oral hygiene  Community Resource Referral / Chronic Care Management: CRR required this visit?  No   CCM required this visit?  No      Plan:     I have personally reviewed and noted the following in the patients chart:   Medical and social history Use of alcohol, tobacco or illicit drugs  Current medications and supplements including opioid prescriptions. Patient is not currently taking opioid prescriptions. Functional ability and  status Nutritional status Physical activity Advanced directives List of other physicians Hospitalizations, surgeries, and ER visits in previous 12 months Vitals Screenings to include cognitive, depression, and falls Referrals and appointments  In addition, I have reviewed and discussed with patient certain preventive protocols, quality metrics, and best practice recommendations. A written personalized care plan for preventive services as well as general preventive health recommendations were provided to patient.     Chriss Driver, LPN   [id number]   Nurse Notes: Pt states he is doing well. Overdue for eye exam. Referral placed today at pt's request. Discussed shingrix vaccine and how to obtain. 6CIT score of 6.

## 2021-07-24 NOTE — Telephone Encounter (Signed)
Pt has question regarding a medication that was on his chart. Pt had Finasteride 5 mg on his list of medications but states he has not been taking for over 1 year. Pt asks if it is okay to continue to not take the medication? I advised the patient that I would double check with you. Thank you.

## 2021-08-13 NOTE — Progress Notes (Signed)
Chronic Care Management Pharmacy Note  08/15/2021 Name:  Eric [name]. MRN:  [id number] DOB:  August 02, 1928  Subjective: Eric [name]. is an 88 y.o. year old male who is a primary patient of Pickard, Cammie Mcgee, MD.  The CCM team was consulted for assistance with disease management and care coordination needs.    Engaged with patient face to face for follow up visit in response to provider referral for pharmacy case management and/or care coordination services.   Consent to Services:  The patient was given the following information about Chronic Care Management services today, agreed to services, and gave verbal consent: 1. CCM service includes personalized support from designated clinical staff supervised by the primary care provider, including individualized plan of care and coordination with other care providers 2. 24/7 contact phone numbers for assistance for urgent and routine care needs. 3. Service will only be billed when office clinical staff spend 20 minutes or more in a month to coordinate care. 4. Only one practitioner may furnish and bill the service in a calendar month. 5.The patient may stop CCM services at any time (effective at the end of the month) by phone call to the office staff. 6. The patient will be responsible for cost sharing (co-pay) of up to 20% of the service fee (after annual deductible is met). Patient agreed to services and consent obtained.  Patient Care Team: [name], MD as PCP - General (Family Medicine) Edythe Clarity, [facility] as Pharmacist (Pharmacist)  Recent office visits:  None since 02/05/21   Recent consult visits:  03/12/21 Urology Ernestine Conrad, Hunt Oris, PA-C. For Benign prostatic Hypertrophy. No medication changes.    Hospital visits:  None since 02/05/21  Objective:  Lab Results  Component Value Date   CREATININE 1.49 (H) 05/08/2021   BUN 19 05/08/2021   GFR 48.95 (L) 10/10/2013   GFRNONAA 44 (L) 05/01/2021   GFRAA 45 (L)  12/31/2020   NA 139 05/08/2021   K 4.3 05/08/2021   CALCIUM 8.8 05/08/2021   CO2 25 05/08/2021    Lab Results  Component Value Date/Time   HGBA1C 5.7 (H) 12/31/2020 08:33 AM   HGBA1C 5.7 (H) 12/09/2020 11:40 AM   GFR 48.95 (L) 10/10/2013 08:59 AM   GFR 47.20 (L) 09/30/2012 08:38 AM   MICROALBUR 2.4 03/01/2017 08:42 AM   MICROALBUR 4.8 08/13/2016 08:00 AM    Last diabetic Eye exam:  Lab Results  Component Value Date/Time   HMDIABEYEEXA No Retinopathy 03/29/2018 12:00 AM    Last diabetic Foot exam:  Lab Results  Component Value Date/Time   HMDIABFOOTEX yes 01/29/2010 12:00 AM     Lab Results  Component Value Date   CHOL 84 12/31/2020   HDL 28 (L) 12/31/2020   LDLCALC 35 12/31/2020   LDLDIRECT 99.1 02/19/2011   TRIG 119 12/31/2020   CHOLHDL 3.0 12/31/2020    Hepatic Function Latest Ref Rng & Units 05/08/2021 04/29/2021 04/27/2021  Total Protein 6.1 - 8.1 g/dL 5.5(L) 5.2(L) 6.3(L)  Albumin 3.5 - 5.0 g/dL - 2.7(L) 2.8(L)  AST 10 - 35 U/L 32 32 44(H)  ALT 9 - 46 U/L 29 28 37  Alk Phosphatase 38 - 126 U/L - 52 52  Total Bilirubin 0.2 - 1.2 mg/dL 0.4 0.4 0.5  Bilirubin, Direct 0.0 - 0.3 mg/dL - - -    Lab Results  Component Value Date/Time   TSH 6.300 (H) 12/09/2020 11:40 AM   TSH 3.428 04/30/2015 08:54 AM   FREET4  0.95 12/30/2020 11:59 AM    CBC Latest Ref Rng & Units 05/08/2021 05/01/2021 04/30/2021  WBC 3.8 - 10.8 Thousand/uL 11.4(H) 11.6(H) 8.8  Hemoglobin 13.2 - 17.1 g/dL 9.8(L) 9.2(L) 7.2(L)  Hematocrit 38.5 - 50.0 % 30.5(L) 28.7(L) 22.4(L)  Platelets 140 - 400 Thousand/uL 218 190 163    Lab Results  Component Value Date/Time   VD25OH 40.0 12/09/2020 11:40 AM   VD25OH 26 (L) 09/20/2009 08:55 PM    Clinical ASCVD: No  The ASCVD Risk score (Arnett DK, et al., 2019) failed to calculate for the following reasons:   The 2019 ASCVD risk score is only valid for ages 44 to 63    Depression screen PHQ 2/9 07/24/2021 05/20/2021 05/08/2021  Decreased Interest 0 0 0   Down, Depressed, Hopeless 0 0 0  PHQ - 2 Score 0 0 0  Altered sleeping - - -  Tired, decreased energy - - -  Change in appetite - - -  Feeling bad or failure about yourself  - - -  Trouble concentrating - - -  Moving slowly or fidgety/restless - - -  Suicidal thoughts - - -  PHQ-9 Score - - -  Difficult doing work/chores - - -  Some recent data might be hidden     Social History   Tobacco Use  Smoking Status Never  Smokeless Tobacco Former   Quit date: 1994   BP Readings from Last 3 Encounters:  07/24/21 124/62  05/20/21 (!) 112/58  05/08/21 (!) 110/58   Pulse Readings from Last 3 Encounters:  07/24/21 63  05/20/21 61  05/08/21 78   Wt Readings from Last 3 Encounters:  07/24/21 163 lb (73.9 kg)  05/20/21 163 lb (73.9 kg)  05/08/21 163 lb (73.9 kg)    Assessment/Interventions: Review of patient past medical history, allergies, medications, health status, including review of consultants reports, laboratory and other test data, was performed as part of comprehensive evaluation and provision of chronic care management services.   SDOH:  (Social Determinants of Health) assessments and interventions performed: No   CCM Care Plan  Allergies  Allergen Reactions   Isosorbide Mononitrate Other (See Comments)    Unknown, doesn't remember    Ramipril Cough and Other (See Comments)   Quinidine Gluconate Rash   Quinidine Gluconate Other (See Comments) and Rash    Medications Reviewed Today     Reviewed by Edythe Clarity, [facility] (Pharmacist) on 08/14/21 at [place] List Status: <None>   Medication Order Taking? Sig Documenting Provider Last Dose Status Informant  acetaminophen (TYLENOL) 325 MG tablet [id number] Yes Take 2 tablets (650 mg total) by mouth every 6 (six) hours as needed for mild pain (or temp >/= 101 F). Loletha Grayer, MD Taking Active Self  amLODipine (NORVASC) 10 MG tablet [id number] Yes Take 1 tablet by mouth once daily [name], MD Taking  Active   atenolol (TENORMIN) 25 MG tablet [id number] Yes Take 1 tablet by mouth once daily [name], MD Taking Active            Med Note Langston Masker, Toney Rakes Jul 24, 2021  9:56 AM) Pt advised by Cardiologist to take 1/2 tab once a day. 07/24/2021.  ELIQUIS 5 MG TABS tablet [id number] Yes Take 1 tablet by mouth twice daily [name], MD Taking Active   ferrous sulfate 325 (65 FE) MG EC tablet [id number] Yes Take 325 mg by mouth at bedtime. [provider] Taking Active  Self  finasteride (PROSCAR) 5 MG tablet [id number] Yes TAKE 1 TABLET BY MOUTH ONCE DAILY [name], MD Taking Active   furosemide (LASIX) 40 MG tablet [id number] Yes TAKE 1 TABLET BY MOUTH ONCE DAILY AS NEEDED FOR  LEG  SWELLING [name], MD Taking Active   hydrALAZINE (APRESOLINE) 25 MG tablet [id number] Yes TAKE 3 TABLETS BY MOUTH THREE TIMES DAILY [name], MD Taking Active   Multiple Vitamin (DAILY MULTIVITAMIN PO) [id number] Yes Take 1 tablet by mouth daily. [provider] Taking Active Self  Donald Siva test strip [id number] Yes USE 1 STRIP TO CHECK GLUCOSE ONCE DAILY [name], MD Taking Active Self  pentoxifylline (TRENTAL) 400 MG CR tablet [id number] Yes TAKE 1 TABLET BY MOUTH EVERY 12 HOURS  Patient taking differently: Take 400 mg by mouth in the morning and at bedtime.   [name], MD Taking Active Self  rosuvastatin (CRESTOR) 40 MG tablet [id number] Yes TAKE 1 TABLET BY MOUTH ONCE DAILY -  NEED  APPT [name], MD Taking Active   tamsulosin [facility]) 0.4 MG CAPS capsule [id number] Yes Take 1 capsule by mouth once daily [name], MD Taking Active             Patient Active Problem List   Diagnosis Date Noted   Diverticulosis of colon with hemorrhage    Erosive gastritis    Benign neoplasm of transverse colon    Benign neoplasm of descending colon    Acute GI bleeding 04/27/2021   Acute blood loss anemia 04/27/2021    Acute kidney injury superimposed on CKD ([place]) 04/27/2021   Hematuria 07/24/2020   Hypertensive urgency 03/08/2019   Hx of BKA, left ([place]) 01/13/2018   Nausea and vomiting in adult 12/26/2017   Pressure injury of skin 12/23/2017   C. difficile colitis 12/22/2017   UTI (urinary tract infection) 12/22/2017   Hyponatremia 12/22/2017   Normocytic anemia 12/22/2017   ASO (arteriosclerosis obliterans) 11/23/2017   Chronic anticoagulation 09/26/2015   History of anticoagulant therapy 09/26/2015   PVD (peripheral vascular disease) ([place]) 06/02/2012   Type 2 diabetes mellitus ([place]) 05/17/2012   Benign prostatic hyperplasia 11/16/2011   AKI (acute kidney injury) ([place]) 08/26/2011   Chronic kidney disease (CKD), stage III (moderate) ([place]) 08/26/2011   Chronic coronary artery disease 03/25/2011   Obstructive sleep apnea 09/15/2010   Other specified extrapyramidal and movement disorders 09/15/2010   PERSONAL HISTORY OF COLONIC POLYPS 10/25/2009   Vitamin B-complex deficiency 03/04/2009   Atrial flutter ([place]) 12/25/2008   Hypertriglyceridemia 11/24/2006   Essential hypertension 11/24/2006    Immunization History  Administered Date(s) Administered   Fluad Quad(high Dose 65+) 05/03/2019, 05/20/2021   Influenza Split 07/14/2011, 06/02/2012   Influenza Whole 05/24/2005, 05/20/2007, 05/21/2008, 07/19/2009, 05/28/2010   Influenza, High Dose Seasonal PF 05/03/2017   Influenza,inj,Quad PF,6+ Mos 04/11/2013, 04/20/2014, 05/06/2015, 06/02/2016, 05/13/2018   Influenza-Unspecified 05/03/2017   PFIZER(Purple Top)SARS-COV-2 Vaccination 10/05/2019, 10/20/2019   Pneumococcal Conjugate-13 05/06/2015   Pneumococcal Polysaccharide-23 10/17/2013   Td 03/13/2004   Tdap 04/14/2019    Conditions to be addressed/monitored:  HTN, Type II DM w/ CKD, Hypertriglyceridemia, Atrial flutter   Goals Addressed             This Visit's Progress    Track and Manage My Blood Pressure-Hypertension   On track     Timeframe:  Long-Range Goal Priority:  High Start Date:   11/29/20  Expected End Date:    05/31/21                   Follow Up Date 03/09/21   - check blood pressure daily - choose a place to take my blood pressure (home, clinic or office, retail store) - write blood pressure results in a log or diary    Why is this important?   You won't feel high blood pressure, but it can still hurt your blood vessels.  High blood pressure can cause heart or kidney problems. It can also cause a stroke.  Making lifestyle changes like losing a little weight or eating less salt will help.  Checking your blood pressure at home and at different times of the day can help to control blood pressure.  If the doctor prescribes medicine remember to take it the way the doctor ordered.  Call the office if you cannot afford the medicine or if there are questions about it.     Notes:         Care Plan : General Pharmacy (Adult)  Updates made by Edythe Clarity, RPH since 08/15/2021 12:00 AM     Problem: HTN, Type II DM w/ CKD, Hypertriglyceridemia, Atrial flutter   Priority: High  Onset Date: 10/11/2020     Long-Range Goal: Patient-Specific Goal   Start Date: 10/11/2020  Expected End Date: 04/13/2021  Recent Progress: On track  Priority: High  Note:    Current Barriers:  Unable to independently afford treatment regimen  Pharmacist Clinical Goal(s):  Over the next 180 days, patient will verbalize ability to afford treatment regimen maintain control of blood pressure as evidenced by home monitoring  adhere to prescribed medication regimen as evidenced by fill dates contact provider office for questions/concerns as evidenced notation of same in electronic health record through collaboration with PharmD and provider.   Interventions: 1:1 collaboration with [name], MD regarding development and update of comprehensive plan of care as evidenced by provider attestation and  co-signature Inter-disciplinary care team collaboration (see longitudinal plan of care) Comprehensive medication review performed; medication list updated in electronic medical record  Hypertension (BP goal <130/80) -Controlled -Current treatment: Amlodipine 10m daily - Appropriate, Query effective Atenolol 272mone-half tablet po daily  Appropriate, Query effective Hydralazine 2560mhree tablets po tid  Appropriate, Query effective -Medications previously tried: ramipril (cough), isosorbide  -Current home readings: 163/70 55P, 160/73 54P, 163/78 61P -Denies hypotensive/hypertensive symptoms -Educated on BP goals and benefits of medications for prevention of heart attack, stroke and kidney damage; Daily salt intake goal < 2300 mg; Importance of home blood pressure monitoring; -Counseled to monitor BP at home daily, document, and provide log at future appointments -Adherence data from 2021 showed 76.7% PDCK[place]konongr HTN medications - confirmed medications taking, will continue to follow for adherence purposed -Recommended to continue current medication, unclear if patient was adherent to all BP meds.  Have asked him to monitor and record BP closely and to contact me if it is consistently > 140/90.  Will initiate monitoring plan to follow BP.  Update 08/14/21 Home readings - 157/65 P 56, 155/71 P 47, 158/80 P 57, 135/47 P 66, 154/61, 135/70 P 59, 169/81 P59 BP readings seem to be consistently elevated.  However, for his age they are not overly elevated and and he has a history of low BP recently in office. At this time I have asked he simply continue to monitor at home, contact me with any consistent significant elevations.  Will have CMA check up in 30 days to see how BP is. Also recommended he make appt with PCP for physical which looks like is due in about May of this year. No changes to meds at this time.  Hypertriglyceridemia: (LDL goal < 70, TG < 150) -Controlled -Current  treatment: Rosuvastatin 37m -Medications previously tried: none noted   -Educated on Cholesterol goals;  Benefits of statin for ASCVD risk reduction;  -patient still needs updated lipid panel -Recommended to continue current medication  Diabetes (A1c goal <7%) -Controlled -Current medications: None -Medications previously tried: none noted   -Denies hypoglycemic/hyperglycemic symptoms -Current meal patterns: patient is watching portion sizes -Current exercise: minimal -Educated on A1c and blood sugar goals; -Counseled to check feet daily and get yearly eye exams -Recommended continue current management strategies  Atrial Flutter(Goal: Control HR) -Controlled -Current treatment  Eliquis 510mtwice daily - Query Appropriate -Medications previously tried: none noted -Assessed patient finances. He presents to the office today with this as main concern.  Brings in paperwork for eliquis PAP program through [employer].  Paperwork completed and patient signatures obtained.  Will have Dr. PiDennard Schaumannign Rx portion.  They have still not met 3% OOP spend requirement, but provided the number they need to reach and they will contact me once they have done this. -Denies abnormal bleeding or bruising. -Currently taking as prescribed. -Recommend continue current medications.  11/28/20 Patient has still not med 3% OOP.  Continues to be able to afford medication. Continue to watch Scr.  Due to age if Scr > 1.5 patient would need to have dose of Eliquis decreased to 2.35m74mID. Recommend continue current medications for now, contact me when 3% OOP has been met.  Update 08/14/21 Patient last Scr was 1.49 - it has previously been above the 1.5 threshhold as he is > 82 96ars old. At this point with his fluctuating Hgb I would recommend we switch him to the 2.35mg81miquis BID out of precaution for his fluctuating Scr and his low Hgb previously. He denies any bleeding at this time, will consult with PCP on this  change. Patient due to pickup rx this week and have asked that he wait until he hears from me before picking up new rx.   Patient Goals/Self-Care Activities Over the next 180 days, patient will:  - take medications as prescribed check blood pressure daily, document, and provide at future appointments collaborate with provider on medication access solutions  Follow Up Plan: The care management team will reach out to the patient again over the next 180 days.                Medication Assistance: Application for Eliquis  medication assistance program. in process.  Anticipated assistance start date unknown.  Patient must meet 3% OOP to qualify..  See plan of care for additional detail.  Patient's preferred pharmacy is:  [facility]: [phone]: [phone] ([place] -[place]: [phone]: [phone] pill box? Yes Pt endorses 100% compliance  We discussed: Benefits of medication synchronization, packaging and delivery as well as enhanced pharmacist oversight with [employer]. Patient decided to: Continue current medication management strategy  Care Plan and Follow Up Patient Decision:  Patient agrees to Care Plan and Follow-up.  Plan: The care management team will reach out to  the patient again over the next 180 days.  Beverly Milch, PharmD Clinical Pharmacist [place] [phone]

## 2021-08-14 ENCOUNTER — Ambulatory Visit (INDEPENDENT_AMBULATORY_CARE_PROVIDER_SITE_OTHER): Payer: PPO | Admitting: Pharmacist

## 2021-08-14 ENCOUNTER — Other Ambulatory Visit: Payer: Self-pay | Admitting: Family Medicine

## 2021-08-14 DIAGNOSIS — I482 Chronic atrial fibrillation, unspecified: Secondary | ICD-10-CM

## 2021-08-14 DIAGNOSIS — I10 Essential (primary) hypertension: Secondary | ICD-10-CM

## 2021-08-14 NOTE — Patient Instructions (Addendum)
Visit Information   Goals Addressed             This Visit's Progress    Track and Manage My Blood Pressure-Hypertension   On track    Timeframe:  Long-Range Goal Priority:  High Start Date:   11/29/20                          Expected End Date:    05/31/21                   Follow Up Date 03/09/21   - check blood pressure daily - choose a place to take my blood pressure (home, clinic or office, retail store) - write blood pressure results in a log or diary    Why is this important?   You won't feel high blood pressure, but it can still hurt your blood vessels.  High blood pressure can cause heart or kidney problems. It can also cause a stroke.  Making lifestyle changes like losing a little weight or eating less salt will help.  Checking your blood pressure at home and at different times of the day can help to control blood pressure.  If the doctor prescribes medicine remember to take it the way the doctor ordered.  Call the office if you cannot afford the medicine or if there are questions about it.     Notes:        Patient Care Plan: General Pharmacy (Adult)     Problem Identified: HTN, Type II DM w/ CKD, Hypertriglyceridemia, Atrial flutter   Priority: High  Onset Date: 10/11/2020     Long-Range Goal: Patient-Specific Goal   Start Date: 10/11/2020  Expected End Date: 04/13/2021  Recent Progress: On track  Priority: High  Note:    Current Barriers:  Unable to independently afford treatment regimen  Pharmacist Clinical Goal(s):  Over the next 180 days, patient will verbalize ability to afford treatment regimen maintain control of blood pressure as evidenced by home monitoring  adhere to prescribed medication regimen as evidenced by fill dates contact provider office for questions/concerns as evidenced notation of same in electronic health record through collaboration with PharmD and provider.   Interventions: 1:1 collaboration with [name], MD  regarding development and update of comprehensive plan of care as evidenced by provider attestation and co-signature Inter-disciplinary care team collaboration (see longitudinal plan of care) Comprehensive medication review performed; medication list updated in electronic medical record  Hypertension (BP goal <130/80) -Controlled -Current treatment: Amlodipine 27m daily - Appropriate, Query effective Atenolol 243mone-half tablet po daily  Appropriate, Query effective Hydralazine 2590mhree tablets po tid  Appropriate, Query effective -Medications previously tried: ramipril (cough), isosorbide  -Current home readings: 163/70 55P, 160/73 54P, 163/78 61P -Denies hypotensive/hypertensive symptoms -Educated on BP goals and benefits of medications for prevention of heart attack, stroke and kidney damage; Daily salt intake goal < 2300 mg; Importance of home blood pressure monitoring; -Counseled to monitor BP at home daily, document, and provide log at future appointments -Adherence data from 2021 showed 76.7% PDCClarksr HTN medications - confirmed medications taking, will continue to follow for adherence purposed -Recommended to continue current medication, unclear if patient was adherent to all BP meds.  Have asked him to monitor and record BP closely and to contact me if it is consistently > 140/90.  Will initiate monitoring plan to follow BP.  Update 08/14/21 Home readings - 157/65 P 56, 155/71 P  47, 158/80 P 57, 135/47 P 66, 154/61, 135/70 P 59, 169/81 P59 BP readings seem to be consistently elevated.  However, for his age they are not overly elevated and and he has a history of low BP recently in office. At this time I have asked he simply continue to monitor at home, contact me with any consistent significant elevations. Will have CMA check up in 30 days to see how BP is. Also recommended he make appt with PCP for physical which looks like is due in about May of this year. No changes to meds  at this time.  Hypertriglyceridemia: (LDL goal < 70, TG < 150) -Controlled -Current treatment: Rosuvastatin 9m -Medications previously tried: none noted   -Educated on Cholesterol goals;  Benefits of statin for ASCVD risk reduction;  -patient still needs updated lipid panel -Recommended to continue current medication  Diabetes (A1c goal <7%) -Controlled -Current medications: None -Medications previously tried: none noted   -Denies hypoglycemic/hyperglycemic symptoms -Current meal patterns: patient is watching portion sizes -Current exercise: minimal -Educated on A1c and blood sugar goals; -Counseled to check feet daily and get yearly eye exams -Recommended continue current management strategies  Atrial Flutter(Goal: Control HR) -Controlled -Current treatment  Eliquis 564mtwice daily - Query Appropriate -Medications previously tried: none noted -Assessed patient finances. He presents to the office today with this as main concern.  Brings in paperwork for eliquis PAP program through [employer].  Paperwork completed and patient signatures obtained.  Will have Dr. PiDennard Schaumannign Rx portion.  They have still not met 3% OOP spend requirement, but provided the number they need to reach and they will contact me once they have done this. -Denies abnormal bleeding or bruising. -Currently taking as prescribed. -Recommend continue current medications.  11/28/20 Patient has still not med 3% OOP.  Continues to be able to afford medication. Continue to watch Scr.  Due to age if Scr > 1.5 patient would need to have dose of Eliquis decreased to 2.27m70mID. Recommend continue current medications for now, contact me when 3% OOP has been met.  Update 08/14/21 Patient last Scr was 1.49 - it has previously been above the 1.5 threshhold as he is > 80 78ars old. At this point with his fluctuating Hgb I would recommend we switch him to the 2.27mg91miquis BID out of precaution for his fluctuating Scr and his  low Hgb previously. He denies any bleeding at this time, will consult with PCP on this change. Patient due to pickup rx this week and have asked that he wait until he hears from me before picking up new rx.   Patient Goals/Self-Care Activities Over the next 180 days, patient will:  - take medications as prescribed check blood pressure daily, document, and provide at future appointments collaborate with provider on medication access solutions  Follow Up Plan: The care management team will reach out to the patient again over the next 180 days.               Patient verbalizes understanding of instructions provided today and agrees to view in MyChMartinsburgelephone follow up appointment with pharmacy team member scheduled for: 4 months  ChriEdythe ClarityH Fox LakearmD, CPP Demorestnical Pharmacist Practitioner [phone]

## 2021-08-15 ENCOUNTER — Telehealth: Payer: Self-pay | Admitting: Pharmacist

## 2021-08-15 DIAGNOSIS — I4892 Unspecified atrial flutter: Secondary | ICD-10-CM

## 2021-08-15 MED ORDER — APIXABAN 2.5 MG PO TABS: 2.5000 mg | ORAL_TABLET | Freq: Two times a day (BID) | ORAL | 11 refills | Status: DC

## 2021-08-15 NOTE — Telephone Encounter (Signed)
-----   Message from [name], MD sent at 08/15/2021  1:32 PM EST ----- I agree.  Thank you.  ----- Message ----- From: Edythe Clarity, [facility] Sent: 08/15/2021   8:30 AM EST To: [name], MD  Visit with Eric [name] yesterday.  Currently on Eliquis 5mg  BID.  Hgb 05/08/21 was 9.8. Scr 05/08/21 1.49, as high as 2.2 on 04/27/21.  Recommended to decrease Eliquis to 2.5mg  BID if age > 80 and Scr > 1.5.  With low Hgb would you consider going ahead and decreasing to this dose?   I will follow up and call in rx if so.  Beverly Milch, PharmD, CPP Clinical Pharmacist Practitioner [place] [phone]

## 2021-08-15 NOTE — Chronic Care Management (AMB) (Signed)
See PCP consultation below.   Will call in new rx for Eliquis 2.5mg  BID to preferred pharmacy. Patient called and notified.  Beverly Milch, PharmD, CPP Clinical Pharmacist Practitioner [place] [phone]

## 2021-08-29 DIAGNOSIS — N401 Enlarged prostate with lower urinary tract symptoms: Secondary | ICD-10-CM | POA: Diagnosis not present

## 2021-08-29 DIAGNOSIS — R3914 Feeling of incomplete bladder emptying: Secondary | ICD-10-CM | POA: Diagnosis not present

## 2021-08-29 DIAGNOSIS — N3021 Other chronic cystitis with hematuria: Secondary | ICD-10-CM | POA: Diagnosis not present

## 2021-08-29 DIAGNOSIS — R310 Gross hematuria: Secondary | ICD-10-CM | POA: Diagnosis not present

## 2021-09-04 DIAGNOSIS — Z20822 Contact with and (suspected) exposure to covid-19: Secondary | ICD-10-CM | POA: Diagnosis not present

## 2021-09-05 ENCOUNTER — Encounter: Payer: Self-pay | Admitting: Nurse Practitioner

## 2021-09-05 ENCOUNTER — Ambulatory Visit
Admission: RE | Admit: 2021-09-05 | Discharge: 2021-09-05 | Disposition: A | Discharge status: Home / Self Care | Payer: PPO | Source: Ambulatory Visit | Attending: Nurse Practitioner | Admitting: Nurse Practitioner

## 2021-09-05 ENCOUNTER — Ambulatory Visit (INDEPENDENT_AMBULATORY_CARE_PROVIDER_SITE_OTHER): Payer: PPO | Admitting: Nurse Practitioner

## 2021-09-05 ENCOUNTER — Other Ambulatory Visit: Payer: Self-pay

## 2021-09-05 ENCOUNTER — Ambulatory Visit
Admission: RE | Admit: 2021-09-05 | Discharge: 2021-09-05 | Disposition: A | Discharge status: Home / Self Care | Payer: PPO | Attending: Nurse Practitioner | Admitting: Nurse Practitioner

## 2021-09-05 VITALS — BP 114/64 | HR 64 | Ht 65.0 in | Wt 168.0 lb

## 2021-09-05 DIAGNOSIS — R062 Wheezing: Secondary | ICD-10-CM | POA: Insufficient documentation

## 2021-09-05 DIAGNOSIS — J40 Bronchitis, not specified as acute or chronic: Secondary | ICD-10-CM | POA: Diagnosis not present

## 2021-09-05 DIAGNOSIS — R0602 Shortness of breath: Secondary | ICD-10-CM | POA: Diagnosis not present

## 2021-09-05 DIAGNOSIS — R059 Cough, unspecified: Secondary | ICD-10-CM | POA: Diagnosis not present

## 2021-09-05 DIAGNOSIS — I517 Cardiomegaly: Secondary | ICD-10-CM | POA: Diagnosis not present

## 2021-09-05 MED ORDER — ALBUTEROL SULFATE HFA 108 (90 BASE) MCG/ACT IN AERS: 2.0000 | INHALATION_SPRAY | Freq: Four times a day (QID) | RESPIRATORY_TRACT | 0 refills | Status: DC | PRN

## 2021-09-05 MED ORDER — PREDNISONE 20 MG PO TABS: 40.0000 mg | ORAL_TABLET | Freq: Every day | ORAL | 0 refills | Status: DC

## 2021-09-05 MED ORDER — AZITHROMYCIN 250 MG PO TABS: ORAL_TABLET | ORAL | 0 refills | Status: AC

## 2021-09-05 MED ORDER — ALBUTEROL SULFATE (2.5 MG/3ML) 0.083% IN NEBU
2.5000 mg | INHALATION_SOLUTION | Freq: Once | RESPIRATORY_TRACT | Status: AC
  Administered 2021-09-05: 2.5 mg via RESPIRATORY_TRACT

## 2021-09-05 NOTE — Progress Notes (Signed)
Subjective:    Patient ID: Eric Hough., male    DOB: 1927-10-03, 88 y.o.   MRN: [id number]  HPI: Eric Spisak. is a 88 y.o. male presenting with son for sinus infection.  Chief Complaint  Patient presents with   Sinusitis   UPPER RESPIRATORY TRACT INFECTION Onset: 2 weeks ago Fever: no Body aches: no Chills: no Cough: yes; productive and thick - clear/white Shortness of breath: no Wheezing: no Chest pain: no Chest tightness: no Chest congestion: no Nasal congestion: yes Runny nose: yes Post nasal drip: yes Sneezing: yes Sore throat: no Swollen glands: no Sinus pressure: yes Headache: yes Face pain: no Toothache: no Ear pain: yes  Ear pressure: no  Eyes red/itching: yes; itchy Eye drainage/crusting: no  Nausea: no  Vomiting: no Diarrhea: no  Change in appetite: no  Loss of taste/smell: no  Rash: no Fatigue: no Sick contacts: no Strep contacts: no  Context: better Recurrent sinusitis: no Treatments attempted: nothing Relief with OTC medications: n/a  Allergies  Allergen Reactions   Isosorbide Mononitrate Other (See Comments)    Unknown, doesn't remember    Ramipril Cough and Other (See Comments)   Quinidine Gluconate Rash   Quinidine Gluconate Other (See Comments) and Rash    Outpatient Encounter Medications as of 09/05/2021  Medication Sig Note   acetaminophen (TYLENOL) 325 MG tablet Take 2 tablets (650 mg total) by mouth every 6 (six) hours as needed for mild pain (or temp >/= 101 F).    albuterol (VENTOLIN HFA) 108 (90 Base) MCG/ACT inhaler Inhale 2 puffs into the lungs every 6 (six) hours as needed for wheezing or shortness of breath.    amLODipine (NORVASC) 10 MG tablet Take 1 tablet by mouth once daily    apixaban (ELIQUIS) 2.5 MG TABS tablet Take 1 tablet (2.5 mg total) by mouth 2 (two) times daily.    atenolol (TENORMIN) 25 MG tablet Take 1 tablet by mouth once daily 07/24/2021: Pt advised by Cardiologist to take 1/2 tab once a  day. 07/24/2021.   azithromycin (ZITHROMAX) 250 MG tablet Take 2 tablets on day 1, then 1 tablet daily on days 2 through 5    ferrous sulfate 325 (65 FE) MG EC tablet Take 325 mg by mouth at bedtime.    finasteride (PROSCAR) 5 MG tablet TAKE 1 TABLET BY MOUTH ONCE DAILY    furosemide (LASIX) 40 MG tablet TAKE 1 TABLET BY MOUTH ONCE DAILY AS NEEDED FOR  LEG  SWELLING    hydrALAZINE (APRESOLINE) 25 MG tablet TAKE 3 TABLETS BY MOUTH THREE TIMES DAILY    Multiple Vitamin (DAILY MULTIVITAMIN PO) Take 1 tablet by mouth daily.    ONETOUCH ULTRA test strip USE 1 STRIP TO CHECK GLUCOSE ONCE DAILY    pentoxifylline (TRENTAL) 400 MG CR tablet TAKE 1 TABLET BY MOUTH EVERY 12 HOURS    predniSONE (DELTASONE) 20 MG tablet Take 2 tablets (40 mg total) by mouth daily with breakfast.    rosuvastatin (CRESTOR) 40 MG tablet TAKE 1 TABLET BY MOUTH ONCE DAILY -  NEED  APPT    tamsulosin (FLOMAX) 0.4 MG CAPS capsule Take 1 capsule by mouth once daily    [EXPIRED] albuterol (PROVENTIL) (2.5 MG/3ML) 0.083% nebulizer solution 2.5 mg     No facility-administered encounter medications on file as of 09/05/2021.    Patient Active Problem List   Diagnosis Date Noted   Diverticulosis of colon with hemorrhage    Erosive gastritis  Benign neoplasm of transverse colon    Benign neoplasm of descending colon    Acute GI bleeding 04/27/2021   Acute blood loss anemia 04/27/2021   Acute kidney injury superimposed on CKD ([place]) 04/27/2021   Hematuria 07/24/2020   Hypertensive urgency 03/08/2019   Hx of BKA, left ([place]) 01/13/2018   Nausea and vomiting in adult 12/26/2017   Pressure injury of skin 12/23/2017   C. difficile colitis 12/22/2017   UTI (urinary tract infection) 12/22/2017   Hyponatremia 12/22/2017   Normocytic anemia 12/22/2017   ASO (arteriosclerosis obliterans) 11/23/2017   Chronic anticoagulation 09/26/2015   History of anticoagulant therapy 09/26/2015   PVD (peripheral vascular disease) ([place]) 06/02/2012    Type 2 diabetes mellitus ([place]) 05/17/2012   Benign prostatic hyperplasia 11/16/2011   AKI (acute kidney injury) ([place]) 08/26/2011   Chronic kidney disease (CKD), stage III (moderate) ([place]) 08/26/2011   Chronic coronary artery disease 03/25/2011   Obstructive sleep apnea 09/15/2010   Other specified extrapyramidal and movement disorders 09/15/2010   PERSONAL HISTORY OF COLONIC POLYPS 10/25/2009   Vitamin B-complex deficiency 03/04/2009   Atrial flutter ([place]) 12/25/2008   Hypertriglyceridemia 11/24/2006   Essential hypertension 11/24/2006    Past Medical History:  Diagnosis Date   Atrial flutter ([place])    s/p RFCA   CAD (coronary artery disease)    cath 2003, occluded S-RCA, occluded S-Dx, L-LAD ok, s/p PTCA to LAD   Cataract    CKD (chronic kidney disease) stage 3, GFR 30-59 ml/min (HCC)    Diabetes mellitus    diet controlled   Hearing aid worn    bilateral   History of kidney stones    Long term (current) use of anticoagulants    Neuromuscular disorder ([place])    OSA (obstructive sleep apnea) 12/11   very mild, AHI 7/hr   Persistent atrial fibrillation ([place]) 09/26/2015   Pure hyperglyceridemia    PVD (peripheral vascular disease) ([place])    angioplasty of his right lower extremity in [place] by Dr.[name] 2013   Unspecified essential hypertension    Wears dentures    full upper and lower    Relevant past medical, surgical, family and social history reviewed and updated as indicated. Interim medical history since our last visit reviewed.  Review of Systems Per HPI unless specifically indicated above     Objective:    BP 114/64    Pulse 64    Ht 5\' 5"  (1.651 m)    Wt 168 lb (76.2 kg)    SpO2 97%    BMI 27.96 kg/m   Wt Readings from Last 3 Encounters:  09/05/21 168 lb (76.2 kg)  07/24/21 163 lb (73.9 kg)  05/20/21 163 lb (73.9 kg)    Physical Exam Vitals and nursing note reviewed.  Constitutional:      General: He is not in acute distress.    Appearance: Normal  appearance. He is not toxic-appearing.  HENT:     Head: Normocephalic and atraumatic.     Right Ear: Tympanic membrane, ear canal and external ear normal. There is no impacted cerumen.     Left Ear: Tympanic membrane, ear canal and external ear normal. There is no impacted cerumen.     Ears:     Comments: Wearing hearing aids    Nose: Nose normal. No congestion or rhinorrhea.     Mouth/Throat:     Mouth: Mucous membranes are moist.     Pharynx: Oropharynx is clear. No posterior oropharyngeal erythema.  Eyes:  General:        Right eye: No discharge.        Left eye: No discharge.  Cardiovascular:     Rate and Rhythm: Normal rate. Rhythm irregular.  Pulmonary:     Effort: Pulmonary effort is normal. No respiratory distress.     Breath sounds: Wheezing and rhonchi present. No rales.  Musculoskeletal:     Cervical back: Normal range of motion.  Lymphadenopathy:     Cervical: No cervical adenopathy.  Skin:    General: Skin is warm and dry.     Coloration: Skin is not jaundiced or pale.     Findings: No erythema.  Neurological:     Mental Status: He is alert and oriented to person, place, and time.     Gait: Gait abnormal (walks with cane).      Assessment & Plan:  1. Wheezing Acute.  Albuterol nebulizer given today in office with relief in inspiratory wheezing.  Start albuterol inhaler at home every 4-6 hours as needed for wheezing or shortness of breath.  Obtain chest x-ray to rule out pneumonia given length of symptoms.    - albuterol (PROVENTIL) (2.5 MG/3ML) 0.083% nebulizer solution 2.5 mg - predniSONE (DELTASONE) 20 MG tablet; Take 2 tablets (40 mg total) by mouth daily with breakfast.  Dispense: 10 tablet; Refill: 0 - DG Chest 2 View; Future - albuterol (VENTOLIN HFA) 108 (90 Base) MCG/ACT inhaler; Inhale 2 puffs into the lungs every 6 (six) hours as needed for wheezing or shortness of breath.  Dispense: 8 g; Refill: 0  2. Bronchitis Acute on chronic.  Patient does  have a smoking history and I do suspect some underlying chronic bronchitis, although patient has not had any breathing issues since January 2020.  Start prednisone 40 mg daily for 5 days for wheezing.  Rescue inhaler given to have on hand.  Check chest x-ray to rule out pneumonia today.  If negative, will still treat for likely exacerbation of chronic bronchitis with azithromycin although patient has not been formally diagnosed.  Follow up early next week if no improvement.  If worsening sooner, go to ER.   - predniSONE (DELTASONE) 20 MG tablet; Take 2 tablets (40 mg total) by mouth daily with breakfast.  Dispense: 10 tablet; Refill: 0 - albuterol (VENTOLIN HFA) 108 (90 Base) MCG/ACT inhaler; Inhale 2 puffs into the lungs every 6 (six) hours as needed for wheezing or shortness of breath.  Dispense: 8 g; Refill: 0    Follow up plan: Return if symptoms worsen or fail to improve.

## 2021-09-05 NOTE — Patient Instructions (Signed)
[place], [place], [place] [address]

## 2021-09-09 DIAGNOSIS — I10 Essential (primary) hypertension: Secondary | ICD-10-CM | POA: Diagnosis not present

## 2021-09-09 DIAGNOSIS — I482 Chronic atrial fibrillation, unspecified: Secondary | ICD-10-CM | POA: Diagnosis not present

## 2021-09-11 ENCOUNTER — Telehealth: Payer: Self-pay | Admitting: Pharmacist

## 2021-09-11 NOTE — Progress Notes (Addendum)
Chronic Care Management Pharmacy Assistant   Name: Eric [name].  MRN: [id number] DOB: 31-Oct-1927   Reason for Encounter: Disease State - Hypertension Call    Recent office visits:  09/05/21 Noemi Chapel, NP - Family Medicine - Wheezing - albuterol (VENTOLIN HFA) 108 (90 Base) MCG/ACT inhaler, azithromycin (ZITHROMAX) 250 MG tablet, and predniSONE (DELTASONE) 20 MG tablet prescribed. Chest xray ordered. Follow up if no improvement.   Recent consult visits:  None   Hospital visits: 04/27/21 Medication Reconciliation was completed by comparing discharge summary, patients EMR and Pharmacy list, and upon discussion with patient.  Admitted to the hospital on 04/27/21 due to Acute GI bleeding. Discharge date was 05/01/21. Discharged from [facility]?Medications Started at [facility] Discharge:?? None noted.   Medication Changes at Hospital Discharge: apixaban (Eliquis)  Medications Discontinued at Hospital Discharge: lisinopril 40 MG tablet (ZESTRIL)  Medications that remain the same after Hospital Discharge:??  All other medications will remain the same.    Medications: Outpatient Encounter Medications as of 09/11/2021  Medication Sig Note   acetaminophen (TYLENOL) 325 MG tablet Take 2 tablets (650 mg total) by mouth every 6 (six) hours as needed for mild pain (or temp >/= 101 F).    albuterol (VENTOLIN HFA) 108 (90 Base) MCG/ACT inhaler Inhale 2 puffs into the lungs every 6 (six) hours as needed for wheezing or shortness of breath.    amLODipine (NORVASC) 10 MG tablet Take 1 tablet by mouth once daily    apixaban (ELIQUIS) 2.5 MG TABS tablet Take 1 tablet (2.5 mg total) by mouth 2 (two) times daily.    atenolol (TENORMIN) 25 MG tablet Take 1 tablet by mouth once daily 07/24/2021: Pt advised by Cardiologist to take 1/2 tab once a day. 07/24/2021.   ferrous sulfate 325 (65 FE) MG EC tablet Take 325 mg by mouth at bedtime.    finasteride (PROSCAR) 5 MG tablet  TAKE 1 TABLET BY MOUTH ONCE DAILY    furosemide (LASIX) 40 MG tablet TAKE 1 TABLET BY MOUTH ONCE DAILY AS NEEDED FOR  LEG  SWELLING    hydrALAZINE (APRESOLINE) 25 MG tablet TAKE 3 TABLETS BY MOUTH THREE TIMES DAILY    Multiple Vitamin (DAILY MULTIVITAMIN PO) Take 1 tablet by mouth daily.    ONETOUCH ULTRA test strip USE 1 STRIP TO CHECK GLUCOSE ONCE DAILY    pentoxifylline (TRENTAL) 400 MG CR tablet TAKE 1 TABLET BY MOUTH EVERY 12 HOURS    predniSONE (DELTASONE) 20 MG tablet Take 2 tablets (40 mg total) by mouth daily with breakfast.    rosuvastatin (CRESTOR) 40 MG tablet TAKE 1 TABLET BY MOUTH ONCE DAILY -  NEED  APPT    tamsulosin (FLOMAX) 0.4 MG CAPS capsule Take 1 capsule by mouth once daily    No facility-administered encounter medications on file as of 09/11/2021.   Current antihypertensive regimen:  Amlodipine 10mg  daily  Atenolol 25mg  one-half tablet po daily Hydralazine 25mg  three tablets po tid   How often are you checking your Blood Pressure?  Patient reported he checks his blood pressure a couple times throughout the day.    Current home BP readings: 150/80 (pt reported average)   What recent interventions/DTPs have been made by any provider to improve Blood Pressure control since last CPP Visit:  Patient reported he has not had any recent changes to his regimen.    Any recent hospitalizations or ED visits since last visit with CPP?  Patient did have  hospitalization on 04/27/21-05/01/21 for Acute GI Bleeding.    What diet changes have been made to improve Blood Pressure Control?  Patient reported he doesn't watch salt intake as good as he should.    What exercise is being done to improve your Blood Pressure Control?  Patient reported he does not get a lot of exercise.     Adherence Review: Is the patient currently on ACE/ARB medication? No Does the patient have >5 day gap between last estimated fill dates? No   Care Gaps  GYI:RSWN 07/24/21 Colonoscopy: done  04/28/21 DM Eye Exam: due 03/30/19 DM Foot Exam: due 03/01/18 Microalbumin: N/A HbgAIC: done 12/31/20 (5.7) DEXA:  N/A Mammogram: N/A   Star Rating Drugs: rosuvastatin (CRESTOR) 40 MG tablet - last filled 07/21/21 90 days   Future Appointments  Date Time Provider [place]  09/16/2021 11:30 AM McGowan, Larene Beach A, PA-C [facility] None  01/01/2022 12:30 PM BSFM-CCM PHARMACIST [facility] None  07/30/2022  9:45 AM BSFM-NURSE HEALTH ADVISOR [facility] None    Jobe Gibbon, CCMA Clinical Pharmacist Assistant  [phone]   7 minutes spent in review, coordination, and documentation.  Recent office Bp show control, continue to monitor.  Reviewed by: Beverly Milch, PharmD Clinical Pharmacist [phone]

## 2021-09-12 DIAGNOSIS — N3021 Other chronic cystitis with hematuria: Secondary | ICD-10-CM | POA: Diagnosis not present

## 2021-09-12 DIAGNOSIS — R3914 Feeling of incomplete bladder emptying: Secondary | ICD-10-CM | POA: Diagnosis not present

## 2021-09-12 DIAGNOSIS — N401 Enlarged prostate with lower urinary tract symptoms: Secondary | ICD-10-CM | POA: Diagnosis not present

## 2021-09-15 ENCOUNTER — Ambulatory Visit: Payer: Self-pay | Admitting: Urology

## 2021-09-16 ENCOUNTER — Ambulatory Visit: Payer: Self-pay | Admitting: Urology

## 2021-09-18 ENCOUNTER — Other Ambulatory Visit: Payer: Self-pay | Admitting: Family Medicine

## 2021-09-18 DIAGNOSIS — I10 Essential (primary) hypertension: Secondary | ICD-10-CM

## 2021-09-26 DIAGNOSIS — N183 Chronic kidney disease, stage 3 unspecified: Secondary | ICD-10-CM | POA: Diagnosis not present

## 2021-09-26 DIAGNOSIS — I509 Heart failure, unspecified: Secondary | ICD-10-CM | POA: Diagnosis not present

## 2021-09-26 DIAGNOSIS — Z9714 Presence of artificial left leg (complete) (partial): Secondary | ICD-10-CM | POA: Diagnosis not present

## 2021-09-26 DIAGNOSIS — I480 Paroxysmal atrial fibrillation: Secondary | ICD-10-CM | POA: Diagnosis not present

## 2021-09-26 DIAGNOSIS — Z6827 Body mass index (BMI) 27.0-27.9, adult: Secondary | ICD-10-CM | POA: Diagnosis not present

## 2021-09-26 DIAGNOSIS — D692 Other nonthrombocytopenic purpura: Secondary | ICD-10-CM | POA: Diagnosis not present

## 2021-09-26 DIAGNOSIS — Z89512 Acquired absence of left leg below knee: Secondary | ICD-10-CM | POA: Diagnosis not present

## 2021-10-30 DIAGNOSIS — H353131 Nonexudative age-related macular degeneration, bilateral, early dry stage: Secondary | ICD-10-CM | POA: Diagnosis not present

## 2021-10-30 LAB — HM DIABETES EYE EXAM

## 2021-10-31 ENCOUNTER — Other Ambulatory Visit: Payer: Self-pay | Admitting: Family Medicine

## 2021-11-07 ENCOUNTER — Other Ambulatory Visit: Payer: Self-pay | Admitting: Family Medicine

## 2021-11-15 ENCOUNTER — Other Ambulatory Visit: Payer: Self-pay | Admitting: Family Medicine

## 2021-11-26 ENCOUNTER — Telehealth: Payer: Self-pay | Admitting: Pharmacist

## 2021-11-26 NOTE — Progress Notes (Signed)
? ? ?Chronic Care Management ?Pharmacy Assistant  ? ?Name: Esgar Barnick.  MRN: [id number] DOB: 10-14-1927 ? ? ?Reason for Encounter: Disease State - Hypertension Call  ?  ? ?Recent office visits:  ?None noted.  ? ? ?Recent consult visits:  ?09/26/21 Jerline Pain - Paroxysmal Afib - No notes available.  ? ?09/12/21 Jiles Crocker - Incomplete bladder emptying - No notes available.  ? ? ?Hospital visits:  ?None in previous 6 months ? ?Medications: ?Outpatient Encounter Medications as of 11/26/2021  ?Medication Sig Note  ? acetaminophen (TYLENOL) 325 MG tablet Take 2 tablets (650 mg total) by mouth every 6 (six) hours as needed for mild pain (or temp >/= 101 F).   ? albuterol (VENTOLIN HFA) 108 (90 Base) MCG/ACT inhaler Inhale 2 puffs into the lungs every 6 (six) hours as needed for wheezing or shortness of breath.   ? amLODipine (NORVASC) 10 MG tablet Take 1 tablet by mouth once daily   ? apixaban (ELIQUIS) 2.5 MG TABS tablet Take 1 tablet (2.5 mg total) by mouth 2 (two) times daily.   ? atenolol (TENORMIN) 25 MG tablet Take 1 tablet by mouth once daily 07/24/2021: Pt advised by Cardiologist to take 1/2 tab once a day. 07/24/2021.  ? ferrous sulfate 325 (65 FE) MG EC tablet Take 325 mg by mouth at bedtime.   ? finasteride (PROSCAR) 5 MG tablet TAKE 1 TABLET BY MOUTH ONCE DAILY   ? furosemide (LASIX) 40 MG tablet TAKE 1 TABLET BY MOUTH ONCE DAILY AS NEEDED FOR  LEG  SWELLING   ? hydrALAZINE (APRESOLINE) 25 MG tablet TAKE 3 TABLETS BY MOUTH THREE TIMES DAILY   ? Multiple Vitamin (DAILY MULTIVITAMIN PO) Take 1 tablet by mouth daily.   ? ONETOUCH ULTRA test strip USE 1 STRIP TO CHECK GLUCOSE ONCE DAILY   ? pentoxifylline (TRENTAL) 400 MG CR tablet TAKE 1 TABLET BY MOUTH EVERY 12 HOURS   ? predniSONE (DELTASONE) 20 MG tablet Take 2 tablets (40 mg total) by mouth daily with breakfast.   ? rosuvastatin (CRESTOR) 40 MG tablet Take 1 tablet (40 mg total) by mouth daily.   ? tamsulosin (FLOMAX) 0.4 MG CAPS capsule Take 1 capsule  by mouth once daily   ? ?No facility-administered encounter medications on file as of 11/26/2021.  ? ? ?Current antihypertensive regimen:  ?Amlodipine '10mg'$  daily  ?Atenolol '25mg'$  one-half tablet po daily ?Hydralazine '25mg'$  three tablets po tid  ?  ?How often are you checking your Blood Pressure?  ?Patient reported he checks his blood pressure a couple times throughout the day.  ?  ?  ?Current home BP readings: 152/70 p=62 (yesterday pt reported) ?  ?  ?What recent interventions/DTPs have been made by any provider to improve Blood Pressure control since last CPP Visit:  ?Patient reported he has not had any recent changes to his regimen.  ?  ?  ?Any recent hospitalizations or ED visits since last visit with CPP?  ?Patient has not had any ED visits or hospitalizations since last visit with CPP. ?  ?  ?What diet changes have been made to improve Blood Pressure Control?  ?Patient reported he doesn't watch salt intake as good as he should but he continues to work on it.  ?  ?  ?What exercise is being done to improve your Blood Pressure Control?  ?Patient reported he does not get a lot of exercise. He tries to be as active as he is able.  ?  ?  ?  ?  Adherence Review: ?Is the patient currently on ACE/ARB medication? No ?Does the patient have >5 day gap between last estimated fill dates? No ?  ?  ?Care Gaps ?  ?POI:PPGF 07/24/21 ?Colonoscopy: done 04/28/21 ?DM Eye Exam: due 03/30/19 ?DM Foot Exam: due 03/01/18 ?Microalbumin: N/A ?HbgAIC: done 12/31/20 (5.7) ?DEXA:  N/A ?Mammogram: N/A ?  ?  ?Star Rating Drugs: ?Rosuvastatin (CRESTOR) 40 MG tablet - last filled 07/21/21 90 days  ? ? ? ?Future Appointments  ?Date Time Provider [place]  ?01/01/2022 12:30 PM BSFM-CCM PHARMACIST [facility]  ?07/30/2022  9:45 AM BSFM-NURSE HEALTH ADVISOR [facility]  ? ? ? ?Liza Showfety, CCMA ?Clinical Pharmacist Assistant  ?[phone] ? ? ?

## 2021-12-11 ENCOUNTER — Other Ambulatory Visit: Payer: Self-pay | Admitting: Family Medicine

## 2021-12-11 NOTE — Telephone Encounter (Signed)
Requested Prescriptions  ?Pending Prescriptions Disp Refills  ?? atenolol (TENORMIN) 25 MG tablet [Pharmacy Med Name: Atenolol 25 MG Oral Tablet] 90 tablet 0  ?  Sig: Take 1 tablet by mouth once daily  ?  ? Cardiovascular: Beta Blockers 2 Failed - 12/11/2021  2:27 PM  ?  ?  Failed - Cr in normal range and within 360 days  ?  Creat  ?Date Value Ref Range Status  ?05/08/2021 1.49 (H) 0.70 - 1.22 mg/dL Final  ? ?Creatinine,U  ?Date Value Ref Range Status  ?02/19/2011 48.6 mg/dL Final  ? ?Creatinine, Urine  ?Date Value Ref Range Status  ?08/13/2016 62 20 - 370 mg/dL Final  ?   ?  ?  Passed - Last BP in normal range  ?  BP Readings from Last 1 Encounters:  ?09/05/21 114/64  ?   ?  ?  Passed - Last Heart Rate in normal range  ?  Pulse Readings from Last 1 Encounters:  ?09/05/21 64  ?   ?  ?  Passed - Valid encounter within last 6 months  ?  Recent Outpatient Visits   ?      ? 3 months ago Wheezing  ? [place], Jessica A, NP  ? 6 months ago Immunization due  ? [facility] Pickard, Cammie Mcgee, MD  ? 7 months ago Diverticulosis  ? [facility] Pickard, Cammie Mcgee, MD  ? 11 months ago Type 2 diabetes mellitus with diabetic peripheral angiopathy with gangrene, unspecified whether long term insulin use ([place])  ? [facility] Pickard, Cammie Mcgee, MD  ? 1 year ago Hematuria, unspecified type  ? [place] Eulogio Bear, NP  ?  ?  ? ?  ?  ?  ? ?

## 2021-12-18 NOTE — Progress Notes (Signed)
Chronic Care Management Pharmacy Note  01/01/2022 Name:  Eric [name]. MRN:  [id number] DOB:  Sep 11, 1927  Subjective: Eric [name]. is an 88 y.o. year old male who is a primary patient of Pickard, Cammie Mcgee, MD.  The CCM team was consulted for assistance with disease management and care coordination needs.    Engaged with patient face to face for follow up visit in response to provider referral for pharmacy case management and/or care coordination services.   Consent to Services:  The patient was given the following information about Chronic Care Management services today, agreed to services, and gave verbal consent: 1. CCM service includes personalized support from designated clinical staff supervised by the primary care provider, including individualized plan of care and coordination with other care providers 2. 24/7 contact phone numbers for assistance for urgent and routine care needs. 3. Service will only be billed when office clinical staff spend 20 minutes or more in a month to coordinate care. 4. Only one practitioner may furnish and bill the service in a calendar month. 5.The patient may stop CCM services at any time (effective at the end of the month) by phone call to the office staff. 6. The patient will be responsible for cost sharing (co-pay) of up to 20% of the service fee (after annual deductible is met). Patient agreed to services and consent obtained.  Patient Care Team: [name], MD as PCP - General (Family Medicine) Edythe Clarity, [facility] as Pharmacist (Pharmacist)  Recent office visits:  None noted.      Recent consult visits:  09/26/21 Jerline Pain - Paroxysmal Afib - No notes available.    09/12/21 Jiles Crocker - Incomplete bladder emptying - No notes available.      Hospital visits:  None in previous 6 months  Objective:  Lab Results  Component Value Date   CREATININE 1.49 (H) 05/08/2021   BUN 19 05/08/2021   GFR 48.95 (L) 10/10/2013    GFRNONAA 44 (L) 05/01/2021   GFRAA 45 (L) 12/31/2020   NA 139 05/08/2021   K 4.3 05/08/2021   CALCIUM 8.8 05/08/2021   CO2 25 05/08/2021    Lab Results  Component Value Date/Time   HGBA1C 5.7 (H) 12/31/2020 08:33 AM   HGBA1C 5.7 (H) 12/09/2020 11:40 AM   GFR 48.95 (L) 10/10/2013 08:59 AM   GFR 47.20 (L) 09/30/2012 08:38 AM   MICROALBUR 2.4 03/01/2017 08:42 AM   MICROALBUR 4.8 08/13/2016 08:00 AM    Last diabetic Eye exam:  Lab Results  Component Value Date/Time   HMDIABEYEEXA No Retinopathy 03/29/2018 12:00 AM    Last diabetic Foot exam:  Lab Results  Component Value Date/Time   HMDIABFOOTEX yes 01/29/2010 12:00 AM     Lab Results  Component Value Date   CHOL 84 12/31/2020   HDL 28 (L) 12/31/2020   LDLCALC 35 12/31/2020   LDLDIRECT 99.1 02/19/2011   TRIG 119 12/31/2020   CHOLHDL 3.0 12/31/2020       Latest Ref Rng & Units 05/08/2021   11:36 AM 04/29/2021    4:09 PM 04/27/2021    7:32 PM  Hepatic Function  Total Protein 6.1 - 8.1 g/dL 5.5   5.2   6.3    Albumin 3.5 - 5.0 g/dL  2.7   2.8    AST 10 - 35 U/L 32   32   44    ALT 9 - 46 U/L 29   28   37  Alk Phosphatase 38 - 126 U/L  52   52    Total Bilirubin 0.2 - 1.2 mg/dL 0.4   0.4   0.5      Lab Results  Component Value Date/Time   TSH 6.300 (H) 12/09/2020 11:40 AM   TSH 3.428 04/30/2015 08:54 AM   FREET4 0.95 12/30/2020 11:59 AM       Latest Ref Rng & Units 05/08/2021   11:36 AM 05/01/2021    4:43 AM 04/30/2021    3:17 AM  CBC  WBC 3.8 - 10.8 Thousand/uL 11.4   11.6   8.8    Hemoglobin 13.2 - 17.1 g/dL 9.8   9.2   7.2    Hematocrit 38.5 - 50.0 % 30.5   28.7   22.4    Platelets 140 - 400 Thousand/uL 218   190   163      Lab Results  Component Value Date/Time   VD25OH 40.0 12/09/2020 11:40 AM   VD25OH 26 (L) 09/20/2009 08:55 PM    Clinical ASCVD: No  The ASCVD Risk score (Arnett DK, et al., 2019) failed to calculate for the following reasons:   The 2019 ASCVD risk score is only valid for ages  83 to 34       07/24/2021    9:58 AM 05/20/2021   12:13 PM 05/08/2021   11:18 AM  Depression screen PHQ 2/9  Decreased Interest 0 0 0  Down, Depressed, Hopeless 0 0 0  PHQ - 2 Score 0 0 0     Social History   Tobacco Use  Smoking Status Never  Smokeless Tobacco Former   Quit date: 1994   BP Readings from Last 3 Encounters:  09/05/21 114/64  07/24/21 124/62  05/20/21 (!) 112/58   Pulse Readings from Last 3 Encounters:  09/05/21 64  07/24/21 63  05/20/21 61   Wt Readings from Last 3 Encounters:  09/05/21 168 lb (76.2 kg)  07/24/21 163 lb (73.9 kg)  05/20/21 163 lb (73.9 kg)    Assessment/Interventions: Review of patient past medical history, allergies, medications, health status, including review of consultants reports, laboratory and other test data, was performed as part of comprehensive evaluation and provision of chronic care management services.   SDOH:  (Social Determinants of Health) assessments and interventions performed: No   CCM Care Plan  Allergies  Allergen Reactions   Isosorbide Mononitrate Other (See Comments)    Unknown, doesn't remember    Ramipril Cough and Other (See Comments)   Quinidine Gluconate Rash   Quinidine Gluconate Other (See Comments) and Rash    Medications Reviewed Today     Reviewed by Edythe Clarity, [facility] (Pharmacist) on 01/01/22 at 1246  Med List Status: <None>   Medication Order Taking? Sig Documenting Provider Last Dose Status Informant  acetaminophen (TYLENOL) 325 MG tablet [id number] Yes Take 2 tablets (650 mg total) by mouth every 6 (six) hours as needed for mild pain (or temp >/= 101 F). Loletha Grayer, MD Taking Active Self  albuterol (VENTOLIN HFA) 108 (90 Base) MCG/ACT inhaler [id number] Yes Inhale 2 puffs into the lungs every 6 (six) hours as needed for wheezing or shortness of breath. Eulogio Bear, NP Taking Active   amLODipine (NORVASC) 10 MG tablet [id number] Yes Take 1 tablet by mouth once daily [name], MD Taking Active   apixaban (ELIQUIS) 2.5 MG TABS tablet [id number] Yes Take 1 tablet (2.5 mg total) by mouth 2 (two) times daily. [name], MD  Taking Active   atenolol (TENORMIN) 25 MG tablet [id number] Yes Take 1 tablet by mouth once daily [name], MD Taking Active   ferrous sulfate 325 (65 FE) MG EC tablet [id number] Yes Take 325 mg by mouth at bedtime. [provider] Taking Active Self  finasteride (PROSCAR) 5 MG tablet [id number] Yes TAKE 1 TABLET BY MOUTH ONCE DAILY [name], MD Taking Active   furosemide (LASIX) 40 MG tablet [id number] Yes TAKE 1 TABLET BY MOUTH ONCE DAILY AS NEEDED FOR  LEG  SWELLING [name], MD Taking Active   hydrALAZINE (APRESOLINE) 25 MG tablet [id number] Yes TAKE 3 TABLETS BY MOUTH THREE TIMES DAILY [name], MD Taking Active   Multiple Vitamin (DAILY MULTIVITAMIN PO) [id number] Yes Take 1 tablet by mouth daily. [provider] Taking Active Self  Donald Siva test strip [id number] Yes USE 1 STRIP TO CHECK GLUCOSE ONCE DAILY [name], MD Taking Active Self  pentoxifylline (TRENTAL) 400 MG CR tablet [id number] Yes TAKE 1 TABLET BY MOUTH EVERY 12 HOURS Pickard, Cammie Mcgee, MD Taking Active   predniSONE (DELTASONE) 20 MG tablet [id number] Yes Take 2 tablets (40 mg total) by mouth daily with breakfast. Eulogio Bear, NP Taking Active   rosuvastatin (CRESTOR) 40 MG tablet [id number] Yes Take 1 tablet (40 mg total) by mouth daily. [name], MD Taking Active   tamsulosin [facility]) 0.4 MG CAPS capsule [id number] Yes Take 1 capsule by mouth once daily [name], MD Taking Active             Patient Active Problem List   Diagnosis Date Noted   Diverticulosis of colon with hemorrhage    Erosive gastritis    Benign neoplasm of transverse colon    Benign neoplasm of descending colon    Acute GI bleeding 04/27/2021   Acute blood loss anemia 04/27/2021   Acute kidney injury  superimposed on CKD ([place]) 04/27/2021   Hematuria 07/24/2020   Hypertensive urgency 03/08/2019   Hx of BKA, left ([place]) 01/13/2018   Nausea and vomiting in adult 12/26/2017   Pressure injury of skin 12/23/2017   C. difficile colitis 12/22/2017   UTI (urinary tract infection) 12/22/2017   Hyponatremia 12/22/2017   Normocytic anemia 12/22/2017   ASO (arteriosclerosis obliterans) 11/23/2017   Chronic anticoagulation 09/26/2015   History of anticoagulant therapy 09/26/2015   PVD (peripheral vascular disease) ([place]) 06/02/2012   Type 2 diabetes mellitus ([place]) 05/17/2012   Benign prostatic hyperplasia 11/16/2011   AKI (acute kidney injury) ([place]) 08/26/2011   Chronic kidney disease (CKD), stage III (moderate) ([place]) 08/26/2011   Chronic coronary artery disease 03/25/2011   Obstructive sleep apnea 09/15/2010   Other specified extrapyramidal and movement disorders 09/15/2010   PERSONAL HISTORY OF COLONIC POLYPS 10/25/2009   Vitamin B-complex deficiency 03/04/2009   Atrial flutter ([place]) 12/25/2008   Hypertriglyceridemia 11/24/2006   Essential hypertension 11/24/2006    Immunization History  Administered Date(s) Administered   Fluad Quad(high Dose 65+) 05/03/2019, 05/20/2021   Influenza Split 07/14/2011, 06/02/2012   Influenza Whole 05/24/2005, 05/20/2007, 05/21/2008, 07/19/2009, 05/28/2010   Influenza, High Dose Seasonal PF 05/03/2017   Influenza,inj,Quad PF,6+ Mos 04/11/2013, 04/20/2014, 05/06/2015, 06/02/2016, 05/13/2018   Influenza-Unspecified 05/03/2017   PFIZER(Purple Top)SARS-COV-2 Vaccination 10/05/2019, 10/20/2019   Pneumococcal Conjugate-13 05/06/2015   Pneumococcal Polysaccharide-23 10/17/2013   Td 03/13/2004   Tdap 04/14/2019    Conditions to be addressed/monitored:  HTN, Type II DM w/ CKD, Hypertriglyceridemia, Atrial flutter   Goals Addressed  This Visit's Progress    Track and Manage My Blood Pressure-Hypertension   On track    Timeframe:  Long-Range  Goal Priority:  High Start Date:   11/29/20                          Expected End Date:    05/31/21                   Follow Up Date 07/04/22   - check blood pressure daily - choose a place to take my blood pressure (home, clinic or office, retail store) - write blood pressure results in a log or diary    Why is this important?   You won't feel high blood pressure, but it can still hurt your blood vessels.  High blood pressure can cause heart or kidney problems. It can also cause a stroke.  Making lifestyle changes like losing a little weight or eating less salt will help.  Checking your blood pressure at home and at different times of the day can help to control blood pressure.  If the doctor prescribes medicine remember to take it the way the doctor ordered.  Call the office if you cannot afford the medicine or if there are questions about it.     Notes:   Controlled as of 01/01/22        Care Plan : General Pharmacy (Adult)  Updates made by Edythe Clarity, RPH since 01/01/2022 12:00 AM     Problem: HTN, Type II DM w/ CKD, Hypertriglyceridemia, Atrial flutter   Priority: High  Onset Date: 10/11/2020     Long-Range Goal: Patient-Specific Goal   Start Date: 10/11/2020  Expected End Date: 04/13/2021  Recent Progress: On track  Priority: High  Note:    Current Barriers:  Unable to independently afford treatment regimen  Pharmacist Clinical Goal(s):  Over the next 180 days, patient will verbalize ability to afford treatment regimen maintain control of blood pressure as evidenced by home monitoring  adhere to prescribed medication regimen as evidenced by fill dates contact provider office for questions/concerns as evidenced notation of same in electronic health record through collaboration with PharmD and provider.   Interventions: 1:1 collaboration with [name], MD regarding development and update of comprehensive plan of care as evidenced by provider  attestation and co-signature Inter-disciplinary care team collaboration (see longitudinal plan of care) Comprehensive medication review performed; medication list updated in electronic medical record  Hypertension (BP goal <130/80) -Controlled -Current treatment: Amlodipine 10mg  daily - Appropriate, Query effective Atenolol 25mg  one-half tablet po daily  Appropriate, Query effective Hydralazine 25mg  three tablets po tid  Appropriate, Query effective -Medications previously tried: ramipril (cough), isosorbide  -Current home readings: 163/70 55P, 160/73 54P, 163/78 61P -Denies hypotensive/hypertensive symptoms -Educated on BP goals and benefits of medications for prevention of heart attack, stroke and kidney damage; Daily salt intake goal < 2300 mg; Importance of home blood pressure monitoring; -Counseled to monitor BP at home daily, document, and provide log at future appointments -Adherence data from 2021 showed 76.7% [place] for HTN medications - confirmed medications taking, will continue to follow for adherence purposed -Recommended to continue current medication, unclear if patient was adherent to all BP meds.  Have asked him to monitor and record BP closely and to contact me if it is consistently > 140/90.  Will initiate monitoring plan to follow BP.  Update 08/14/21 Home readings - 157/65 P 56, 155/71 P  47, 158/80 P 57, 135/47 P 66, 154/61, 135/70 P 59, 169/81 P59 BP readings seem to be consistently elevated.  However, for his age they are not overly elevated and and he has a history of low BP recently in office. At this time I have asked he simply continue to monitor at home, contact me with any consistent significant elevations. Will have CMA check up in 30 days to see how BP is. Also recommended he make appt with PCP for physical which looks like is due in about May of this year. No changes to meds at this time.  Update 01/01/22 Home BP - 130s-140s/70-80s He denies any dizziness or  HA, medications remain unchanged. Patient felling well overall and is checking his BP at home. He does not have specific readings available today. Continue current medications as BP is normal for patient age.  Hypertriglyceridemia: (LDL goal < 70, TG < 150) -Controlled -Current treatment: Rosuvastatin 40mg  -Medications previously tried: none noted   -Educated on Cholesterol goals;  Benefits of statin for ASCVD risk reduction;  -patient still needs updated lipid panel -Recommended to continue current medication  Diabetes (A1c goal <7%) -Controlled -Current medications: None -Medications previously tried: none noted   -Denies hypoglycemic/hyperglycemic symptoms -Current meal patterns: patient is watching portion sizes -Current exercise: minimal -Educated on A1c and blood sugar goals; -Counseled to check feet daily and get yearly eye exams -Recommended continue current management strategies  Atrial Flutter(Goal: Control HR) -Controlled -Current treatment  Eliquis 2.5 twice daily - Appropriate, Effective, Safe, Accessible -Medications previously tried: none noted -Assessed patient finances. He presents to the office today with this as main concern.  Brings in paperwork for eliquis PAP program through [employer].  Paperwork completed and patient signatures obtained.  Will have [name] sign Rx portion.  They have still not met 3% OOP spend requirement, but provided the number they need to reach and they will contact me once they have done this. -Denies abnormal bleeding or bruising. -Currently taking as prescribed. -Recommend continue current medications.  11/28/20 Patient has still not med 3% OOP.  Continues to be able to afford medication. Continue to watch Scr.  Due to age if Scr > 1.5 patient would need to have dose of Eliquis decreased to 2.5mg  BID. Recommend continue current medications for now, contact me when 3% OOP has been met.  Update 08/14/21 Patient last Scr was 1.49  - it has previously been above the 1.5 threshhold as he is > 51 years old. At this point with his fluctuating Hgb I would recommend we switch him to the 2.5mg  Eliquis BID out of precaution for his fluctuating Scr and his low Hgb previously. He denies any bleeding at this time, will consult with PCP on this change. Patient due to pickup rx this week and have asked that he wait until he hears from me before picking up new rx.  Update 01/01/22 His dose of Eliquis was decreased to 2.5mg  twice daily. He reports this is going well, denies any abnormal bleeding or bruising. Has not had labs since this change, want to follow Hgb. Currently the price is affordable, had donut hole trouble last year. He knows to reach out if this scenario happens again, must meet 3% OOP spend to qualify for patient assistance.    Patient Goals/Self-Care Activities Over the next 180 days, patient will:  - take medications as prescribed check blood pressure daily, document, and provide at future appointments collaborate with provider on medication access solutions  Follow  Up Plan: The care management team will reach out to the patient again over the next 180 days.                 Medication Assistance: Application for Eliquis  medication assistance program. in process.  Anticipated assistance start date unknown.  Patient must meet 3% OOP to qualify..  See plan of care for additional detail.  Patient's preferred pharmacy is:  [facility] [address] [place] - [place] [address] [place] [address] Phone: [phone] Fax: [phone]  [facility] [place], [place] [address] Phone: [phone] Fax: [phone]  Uses pill box? Yes Pt endorses 100% compliance  We discussed: Benefits of medication synchronization, packaging and delivery as well as enhanced pharmacist oversight with  [employer]. Patient decided to: Continue current medication management strategy  Care Plan and Follow Up Patient Decision:  Patient agrees to Care Plan and Follow-up.  Plan: The care management team will reach out to the patient again over the next 180 days.  Beverly Milch, PharmD Clinical Pharmacist [place] [phone]

## 2021-12-25 DIAGNOSIS — Z89512 Acquired absence of left leg below knee: Secondary | ICD-10-CM | POA: Diagnosis not present

## 2021-12-25 DIAGNOSIS — I10 Essential (primary) hypertension: Secondary | ICD-10-CM | POA: Diagnosis not present

## 2022-01-01 ENCOUNTER — Ambulatory Visit: Payer: PPO | Admitting: Pharmacist

## 2022-01-01 DIAGNOSIS — I10 Essential (primary) hypertension: Secondary | ICD-10-CM

## 2022-01-01 DIAGNOSIS — I4892 Unspecified atrial flutter: Secondary | ICD-10-CM

## 2022-01-01 NOTE — Patient Instructions (Addendum)
Visit Information   Goals Addressed             This Visit's Progress    Track and Manage My Blood Pressure-Hypertension   On track    Timeframe:  Long-Range Goal Priority:  High Start Date:   11/29/20                          Expected End Date:    05/31/21                   Follow Up Date 07/04/22   - check blood pressure daily - choose a place to take my blood pressure (home, clinic or office, retail store) - write blood pressure results in a log or diary    Why is this important?   You won't feel high blood pressure, but it can still hurt your blood vessels.  High blood pressure can cause heart or kidney problems. It can also cause a stroke.  Making lifestyle changes like losing a little weight or eating less salt will help.  Checking your blood pressure at home and at different times of the day can help to control blood pressure.  If the doctor prescribes medicine remember to take it the way the doctor ordered.  Call the office if you cannot afford the medicine or if there are questions about it.     Notes:   Controlled as of 01/01/22       Patient Care Plan: General Pharmacy (Adult)     Problem Identified: HTN, Type II DM w/ CKD, Hypertriglyceridemia, Atrial flutter   Priority: High  Onset Date: 10/11/2020     Long-Range Goal: Patient-Specific Goal   Start Date: 10/11/2020  Expected End Date: 04/13/2021  Recent Progress: On track  Priority: High  Note:    Current Barriers:  Unable to independently afford treatment regimen  Pharmacist Clinical Goal(s):  Over the next 180 days, patient will verbalize ability to afford treatment regimen maintain control of blood pressure as evidenced by home monitoring  adhere to prescribed medication regimen as evidenced by fill dates contact provider office for questions/concerns as evidenced notation of same in electronic health record through collaboration with PharmD and provider.   Interventions: 1:1 collaboration with  [name], MD regarding development and update of comprehensive plan of care as evidenced by provider attestation and co-signature Inter-disciplinary care team collaboration (see longitudinal plan of care) Comprehensive medication review performed; medication list updated in electronic medical record  Hypertension (BP goal <130/80) -Controlled -Current treatment: Amlodipine 55m daily - Appropriate, Query effective Atenolol 263mone-half tablet po daily  Appropriate, Query effective Hydralazine 2519mhree tablets po tid  Appropriate, Query effective -Medications previously tried: ramipril (cough), isosorbide  -Current home readings: 163/70 55P, 160/73 54P, 163/78 61P -Denies hypotensive/hypertensive symptoms -Educated on BP goals and benefits of medications for prevention of heart attack, stroke and kidney damage; Daily salt intake goal < 2300 mg; Importance of home blood pressure monitoring; -Counseled to monitor BP at home daily, document, and provide log at future appointments -Adherence data from 2021 showed 76.7% [place] HTN medications - confirmed medications taking, will continue to follow for adherence purposed -Recommended to continue current medication, unclear if patient was adherent to all BP meds.  Have asked him to monitor and record BP closely and to contact me if it is consistently > 140/90.  Will initiate monitoring plan to follow BP.  Update 08/14/21 Home readings -  157/65 P 56, 155/71 P 47, 158/80 P 57, 135/47 P 66, 154/61, 135/70 P 59, 169/81 P59 BP readings seem to be consistently elevated.  However, for his age they are not overly elevated and and he has a history of low BP recently in office. At this time I have asked he simply continue to monitor at home, contact me with any consistent significant elevations. Will have CMA check up in 30 days to see how BP is. Also recommended he make appt with PCP for physical which looks like is due in about May of this  year. No changes to meds at this time.  Update 01/01/22 Home BP - 130s-140s/70-80s He denies any dizziness or HA, medications remain unchanged. Patient felling well overall and is checking his BP at home. He does not have specific readings available today. Continue current medications as BP is normal for patient age.  Hypertriglyceridemia: (LDL goal < 70, TG < 150) -Controlled -Current treatment: Rosuvastatin 66m -Medications previously tried: none noted   -Educated on Cholesterol goals;  Benefits of statin for ASCVD risk reduction;  -patient still needs updated lipid panel -Recommended to continue current medication  Diabetes (A1c goal <7%) -Controlled -Current medications: None -Medications previously tried: none noted   -Denies hypoglycemic/hyperglycemic symptoms -Current meal patterns: patient is watching portion sizes -Current exercise: minimal -Educated on A1c and blood sugar goals; -Counseled to check feet daily and get yearly eye exams -Recommended continue current management strategies  Atrial Flutter(Goal: Control HR) -Controlled -Current treatment  Eliquis 2.5 twice daily - Appropriate, Effective, Safe, Accessible -Medications previously tried: none noted -Assessed patient finances. He presents to the office today with this as main concern.  Brings in paperwork for eliquis PAP program through [employer].  Paperwork completed and patient signatures obtained.  Will have Dr. PDennard Schaumannsign Rx portion.  They have still not met 3% OOP spend requirement, but provided the number they need to reach and they will contact me once they have done this. -Denies abnormal bleeding or bruising. -Currently taking as prescribed. -Recommend continue current medications.  11/28/20 Patient has still not med 3% OOP.  Continues to be able to afford medication. Continue to watch Scr.  Due to age if Scr > 1.5 patient would need to have dose of Eliquis decreased to 2.548mBID. Recommend  continue current medications for now, contact me when 3% OOP has been met.  Update 08/14/21 Patient last Scr was 1.49 - it has previously been above the 1.5 threshhold as he is > [age]ears old. At this point with his fluctuating Hgb I would recommend we switch him to the 2.36m36mliquis BID out of precaution for his fluctuating Scr and his low Hgb previously. He denies any bleeding at this time, will consult with PCP on this change. Patient due to pickup rx this week and have asked that he wait until he hears from me before picking up new rx.  Update 01/01/22 His dose of Eliquis was decreased to 2.36mg56mice daily. He reports this is going well, denies any abnormal bleeding or bruising. Has not had labs since this change, want to follow Hgb. Currently the price is affordable, had donut hole trouble last year. He knows to reach out if this scenario happens again, must meet 3% OOP spend to qualify for patient assistance.    Patient Goals/Self-Care Activities Over the next 180 days, patient will:  - take medications as prescribed check blood pressure daily, document, and provide at future appointments collaborate with provider on  medication access solutions  Follow Up Plan: The care management team will reach out to the patient again over the next 180 days.               The patient verbalized understanding of instructions, educational materials, and care plan provided today and DECLINED offer to receive copy of patient instructions, educational materials, and care plan.  Telephone follow up appointment with pharmacy team member scheduled for: 6 months  Edythe Clarity, [place], PharmD, [place] Clinical Pharmacist Practitioner [place] [phone]

## 2022-01-15 ENCOUNTER — Encounter: Payer: Self-pay | Admitting: Family Medicine

## 2022-01-15 ENCOUNTER — Ambulatory Visit: Payer: PPO | Admitting: Podiatry

## 2022-01-15 DIAGNOSIS — E1152 Type 2 diabetes mellitus with diabetic peripheral angiopathy with gangrene: Secondary | ICD-10-CM

## 2022-01-15 DIAGNOSIS — Q828 Other specified congenital malformations of skin: Secondary | ICD-10-CM

## 2022-01-15 NOTE — Progress Notes (Signed)
Subjective:  Patient ID: Eric Hough., male    DOB: Mar 21, 1928,  MRN: [id number]  Chief Complaint  Patient presents with   Callouses     Right foot callus , possible nail trim    88 y.o. male presents with the above complaint. Patient presents with right submetatarsal 5 porokeratotic lesion painful to touch.  Patient has a BKA on the left side.  He is a diabetic diet controlled.  He states that he wants to make sure that there is no ulceration.  He states is painful to touch.  He really does not want to lose the right leg.  He denies any other acute complaints.  He has not seen anyone else prior to seeing me.   Review of Systems: Negative except as noted in the HPI. Denies N/V/F/Ch.  Past Medical History:  Diagnosis Date   Atrial flutter ([place])    s/p RFCA   CAD (coronary artery disease)    cath 2003, occluded S-RCA, occluded S-Dx, L-LAD ok, s/p PTCA to LAD   Cataract    CKD (chronic kidney disease) stage 3, GFR 30-59 ml/min (HCC)    Diabetes mellitus    diet controlled   Hearing aid worn    bilateral   History of kidney stones    Long term (current) use of anticoagulants    Neuromuscular disorder (HCC)    OSA (obstructive sleep apnea) 12/11   very mild, AHI 7/hr   Persistent atrial fibrillation ([place]) 09/26/2015   Pure hyperglyceridemia    PVD (peripheral vascular disease) ([place])    angioplasty of his right lower extremity in [place] by Dr.[name] 2013   Unspecified essential hypertension    Wears dentures    full upper and lower    Current Outpatient Medications:    acetaminophen (TYLENOL) 325 MG tablet, Take 2 tablets (650 mg total) by mouth every 6 (six) hours as needed for mild pain (or temp >/= 101 F)., Disp: , Rfl:    albuterol (VENTOLIN HFA) 108 (90 Base) MCG/ACT inhaler, Inhale 2 puffs into the lungs every 6 (six) hours as needed for wheezing or shortness of breath., Disp: 8 g, Rfl: 0   amLODipine (NORVASC) 10 MG tablet, Take 1 tablet by mouth once daily, Disp:  90 tablet, Rfl: 3   apixaban (ELIQUIS) 2.5 MG TABS tablet, Take 1 tablet (2.5 mg total) by mouth 2 (two) times daily., Disp: 60 tablet, Rfl: 11   atenolol (TENORMIN) 25 MG tablet, Take 1 tablet by mouth once daily, Disp: 90 tablet, Rfl: 0   ferrous sulfate 325 (65 FE) MG EC tablet, Take 325 mg by mouth at bedtime., Disp: , Rfl:    finasteride (PROSCAR) 5 MG tablet, TAKE 1 TABLET BY MOUTH ONCE DAILY, Disp: 90 tablet, Rfl: 3   furosemide (LASIX) 40 MG tablet, TAKE 1 TABLET BY MOUTH ONCE DAILY AS NEEDED FOR  LEG  SWELLING, Disp: 30 tablet, Rfl: 1   hydrALAZINE (APRESOLINE) 25 MG tablet, TAKE 3 TABLETS BY MOUTH THREE TIMES DAILY, Disp: 270 tablet, Rfl: 5   Multiple Vitamin (DAILY MULTIVITAMIN PO), Take 1 tablet by mouth daily., Disp: , Rfl:    ONETOUCH ULTRA test strip, USE 1 STRIP TO CHECK GLUCOSE ONCE DAILY, Disp: 50 each, Rfl: 0   pentoxifylline (TRENTAL) 400 MG CR tablet, TAKE 1 TABLET BY MOUTH EVERY 12 HOURS, Disp: 180 tablet, Rfl: 0   predniSONE (DELTASONE) 20 MG tablet, Take 2 tablets (40 mg total) by mouth daily with breakfast., Disp: 10 tablet, Rfl:  0   rosuvastatin (CRESTOR) 40 MG tablet, Take 1 tablet (40 mg total) by mouth daily., Disp: 90 tablet, Rfl: 1   tamsulosin (FLOMAX) 0.4 MG CAPS capsule, Take 1 capsule by mouth once daily, Disp: 90 capsule, Rfl: 1  Social History   Tobacco Use  Smoking Status Never  Smokeless Tobacco Former   Quit date: 1994    Allergies  Allergen Reactions   Isosorbide Mononitrate Other (See Comments)    Unknown, doesn't remember    Ramipril Cough and Other (See Comments)   Quinidine Gluconate Rash   Quinidine Gluconate Other (See Comments) and Rash   Objective:  There were no vitals filed for this visit. There is no height or weight on file to calculate BMI. Constitutional Well developed. Well nourished.  Vascular Dorsalis pedis pulses palpable right side.  BKA on the left.  BKA on the left Posterior tibial pulses palpable right side Capillary  refill normal to all digits.  No cyanosis or clubbing noted. Pedal hair growth normal.  Neurologic Normal speech. Oriented to person, place, and time. Epicritic sensation to light touch grossly present right side.  BKA on the left  Dermatologic Nails thickened elongated dystrophic toenails x5 to the right side.  Painful hyperkeratotic lesion with central nucleated core noted to right submet 5.  Mild pain on palpation Skin within normal limits  Orthopedic: Normal joint ROM without pain or crepitus right side only.  BKA on the left No visible deformities. No bony tenderness.   Radiographs: None Assessment:   1. Porokeratosis    Plan:  Patient was evaluated and treated and all questions answered.  Right submetatarsal 5 porokeratosis -All questions and concerns were discussed with the patient extensive detail.  Given the him nature of his diabetes I believe he will benefit from debridement of the lesion to evaluate and make sure there is no preulcerative lesion underneath it.  I discussed with patient he would like to proceed with that.  Using chisel blade handle the lesion was debrided down to healthy striated tissue.  No signs of underlying skin breakdown noted.  I discussed shoe gear modification extensive detail he states understanding  No follow-ups on file.

## 2022-02-12 ENCOUNTER — Other Ambulatory Visit: Payer: Self-pay | Admitting: Family Medicine

## 2022-02-13 NOTE — Telephone Encounter (Signed)
Requested medications are due for refill today.  yes  Requested medications are on the active medications list.  yes  Last refill. 11/17/2021 #180 0 refills  Future visit scheduled.   no  Notes to clinic.  Failed protocol - expired/abnormal labs    Requested Prescriptions  Pending Prescriptions Disp Refills   pentoxifylline (TRENTAL) 400 MG CR tablet [Pharmacy Med Name: Pentoxifylline ER 400 MG Oral Tablet Extended Release] 180 tablet 0    Sig: TAKE 1 TABLET BY MOUTH EVERY 12 HOURS     Hematology:  Antiplatelets - pentoxifylline Failed - 02/12/2022 12:32 PM      Failed - Cr in normal range and within 360 days    Creat  Date Value Ref Range Status  05/08/2021 1.49 (H) 0.70 - 1.22 mg/dL Final   Creatinine,U  Date Value Ref Range Status  02/19/2011 48.6 mg/dL Final   Creatinine, Urine  Date Value Ref Range Status  08/13/2016 62 20 - 370 mg/dL Final         Failed - PLT in normal range and within 180 days    Platelets  Date Value Ref Range Status  05/08/2021 218 140 - 400 Thousand/uL Final  12/09/2020 158 150 - 450 x10E3/uL Final         Failed - HGB in normal range and within 180 days    Hemoglobin  Date Value Ref Range Status  05/08/2021 9.8 (L) 13.2 - 17.1 g/dL Final  12/09/2020 10.7 (L) 13.0 - 17.7 g/dL Final         Failed - HCT in normal range and within 180 days    HCT  Date Value Ref Range Status  05/08/2021 30.5 (L) 38.5 - 50.0 % Final   Hematocrit  Date Value Ref Range Status  12/09/2020 33.2 (L) 37.5 - 51.0 % Final         Passed - Valid encounter within last 6 months    Recent Outpatient Visits           5 months ago [place] Eulogio Bear, NP   8 months ago Immunization due   [place], Cammie Mcgee, MD   9 months ago Diverticulosis   [place] [name], MD   1 year ago Type 2 diabetes mellitus with diabetic peripheral angiopathy with gangrene, unspecified  whether long term insulin use ([place])   [place] Pickard, Cammie Mcgee, MD   1 year ago Hematuria, unspecified type   [place] Eulogio Bear, NP

## 2022-02-26 NOTE — Telephone Encounter (Signed)
Please called this pt to make cpe for future med refills

## 2022-02-27 ENCOUNTER — Telehealth: Payer: Self-pay

## 2022-02-27 NOTE — Telephone Encounter (Signed)
Rec' form on 02/23/22 for dis placard from pt Completed form 02/27/22 per [name]. Form is up front to be picked up

## 2022-03-13 ENCOUNTER — Other Ambulatory Visit: Payer: Self-pay | Admitting: Family Medicine

## 2022-03-13 NOTE — Telephone Encounter (Signed)
Requested medication (s) are due for refill today: yes  Requested medication (s) are on the active medication list: yes  Last refill:  02/26/22 #30/0  Future visit scheduled: yes 03/16/22  Notes to clinic:  Unable to refill per protocol due to failed labs, no updated results.      Requested Prescriptions  Pending Prescriptions Disp Refills   pentoxifylline (TRENTAL) 400 MG CR tablet [Pharmacy Med Name: Pentoxifylline ER 400 MG Oral Tablet Extended Release] 30 tablet 0    Sig: TAKE 1 TABLET BY MOUTH EVERY 12 HOURS . APPOINTMENT REQUIRED FOR FUTURE REFILLS     Hematology:  Antiplatelets - pentoxifylline Failed - 03/13/2022 12:56 PM      Failed - Cr in normal range and within 360 days    Creat  Date Value Ref Range Status  05/08/2021 1.49 (H) 0.70 - 1.22 mg/dL Final   Creatinine,U  Date Value Ref Range Status  02/19/2011 48.6 mg/dL Final   Creatinine, Urine  Date Value Ref Range Status  08/13/2016 62 20 - 370 mg/dL Final         Failed - PLT in normal range and within 180 days    Platelets  Date Value Ref Range Status  05/08/2021 218 140 - 400 Thousand/uL Final  12/09/2020 158 150 - 450 x10E3/uL Final         Failed - HGB in normal range and within 180 days    Hemoglobin  Date Value Ref Range Status  05/08/2021 9.8 (L) 13.2 - 17.1 g/dL Final  12/09/2020 10.7 (L) 13.0 - 17.7 g/dL Final         Failed - HCT in normal range and within 180 days    HCT  Date Value Ref Range Status  05/08/2021 30.5 (L) 38.5 - 50.0 % Final   Hematocrit  Date Value Ref Range Status  12/09/2020 33.2 (L) 37.5 - 51.0 % Final         Failed - Valid encounter within last 6 months    Recent Outpatient Visits           6 months ago [place] Eulogio Bear, NP   9 months ago Immunization due   [place] Pickard, Cammie Mcgee, MD   10 months ago Diverticulosis   [place] [name], MD   1 year ago Type 2 diabetes  mellitus with diabetic peripheral angiopathy with gangrene, unspecified whether long term insulin use ([place])   [place] Pickard, Cammie Mcgee, MD   1 year ago Hematuria, unspecified type   [place] Eulogio Bear, NP

## 2022-03-16 ENCOUNTER — Ambulatory Visit (INDEPENDENT_AMBULATORY_CARE_PROVIDER_SITE_OTHER): Payer: PPO | Admitting: Family Medicine

## 2022-03-16 VITALS — BP 120/64 | HR 82 | Temp 98.2°F

## 2022-03-16 DIAGNOSIS — I482 Chronic atrial fibrillation, unspecified: Secondary | ICD-10-CM | POA: Diagnosis not present

## 2022-03-16 DIAGNOSIS — L84 Corns and callosities: Secondary | ICD-10-CM | POA: Diagnosis not present

## 2022-03-16 DIAGNOSIS — L989 Disorder of the skin and subcutaneous tissue, unspecified: Secondary | ICD-10-CM

## 2022-03-16 DIAGNOSIS — E11628 Type 2 diabetes mellitus with other skin complications: Secondary | ICD-10-CM

## 2022-03-16 NOTE — Progress Notes (Signed)
Subjective:    Patient ID: Eric [name]., male    DOB: 1927/09/28, 88 y.o.   MRN: [id number]  Medication Refill    Patient was admitted to the hospital with lower GI bleed in September.  He is overdue to recheck a CBC to monitor his hemoglobin.  He still taking Eliquis but he denies any hematochezia or melena.  His blood pressure today is well controlled at 120/64.  He denies any chest pain or shortness of breath or dyspnea on exertion.  He is still taking an iron supplement.  He denies any syncope or near syncope.  He denies any nausea vomiting or abdominal pain.  He is due to recheck his hemoglobin A1c.  He denies any polyuria polydipsia or blurry vision. Past Medical History:  Diagnosis Date   Atrial flutter ([place])    s/p RFCA   CAD (coronary artery disease)    cath 2003, occluded S-RCA, occluded S-Dx, L-LAD ok, s/p PTCA to LAD   Cataract    CKD (chronic kidney disease) stage 3, GFR 30-59 ml/min (HCC)    Diabetes mellitus    diet controlled   Hearing aid worn    bilateral   History of kidney stones    Long term (current) use of anticoagulants    Neuromuscular disorder (HCC)    OSA (obstructive sleep apnea) 12/11   very mild, AHI 7/hr   Persistent atrial fibrillation ([place]) 09/26/2015   Pure hyperglyceridemia    PVD (peripheral vascular disease) ([place])    angioplasty of his right lower extremity in [place] by Dr.[name] 2013   Unspecified essential hypertension    Wears dentures    full upper and lower   Past Surgical History:  Procedure Laterality Date   ABDOMINAL AORTOGRAM W/LOWER EXTREMITY N/A 11/15/2017   Procedure: ABDOMINAL AORTOGRAM W/LOWER EXTREMITY;  Surgeon: [name], MD;  Location: [place];  Service: Cardiovascular;  Laterality: N/A;   Adenosine Myoview  3/06   EF 56%, neg. Ischemia   Adenosine Myoview  02/18/07   nml   AMPUTATION Left 12/15/2017   Procedure: AMPUTATION BELOW KNEE;  Surgeon: Algernon [name], MD;  Location: [facility];  Service:  Vascular;  Laterality: Left;   ANGIOPLASTY  1/99   CAD- diogonal with rotational artherectomy   Arthrectomy     of LAD & PTCA   BLEPHAROPLASTY Bilateral    CARDIAC CATHETERIZATION  1/00   CARDIOVERSION  1/04   CARDIOVERSION  5/07   hospital- a flutter   CARPAL TUNNEL RELEASE     ? bilateral   CATARACT EXTRACTION W/PHACO Right 07/28/2017   Procedure: CATARACT EXTRACTION PHACO AND INTRAOCULAR LENS PLACEMENT ([place]) RIGHT DIABETIC;  Surgeon: [name], MD;  Location: [place];  Service: Ophthalmology;  Laterality: Right;  Diabetic - diet controlled   CATARACT EXTRACTION W/PHACO Left 08/18/2017   Procedure: CATARACT EXTRACTION PHACO AND INTRAOCULAR LENS PLACEMENT ([facility]);  Surgeon: [name], MD;  Location: [facility];  Service: Ophthalmology;  Laterality: Left;  DIABETES - oral meds   COLONOSCOPY W/ BIOPSIES  10/01/06   sigmoid polyp bx neg, 3 years   COLONOSCOPY WITH PROPOFOL N/A 04/28/2021   Procedure: COLONOSCOPY WITH PROPOFOL;  Surgeon: Gatha Mayer, MD;  Location: [facility];  Service: Endoscopy;  Laterality: N/A;   CORONARY ANGIOPLASTY  4/03   cutting balloon PTCA pLAD into Diag   CORONARY ARTERY BYPASS GRAFT  2000   LIMA-LAD, SVG-RCA, SVG-Diag; SVG-Diag & SVG-RCA occluded 2003   FRACTURE SURGERY  HAND SURGERY  08/27/09   R thumb procedure wit Scaphoid Gragt and screws, Dr [name]   LOWER EXTREMITY ANGIOGRAPHY Right 01/25/2019   Procedure: LOWER EXTREMITY ANGIOGRAPHY;  Surgeon: Katha Cabal, MD;  Location: [place] [facility];  Service: Cardiovascular;  Laterality: Right;   NM MYOVIEW LTD  4/11   normal   PERIPHERAL VASCULAR CATHETERIZATION Right 06/27/2015   Procedure: Lower Extremity Angiography;  Surgeon: Algernon [name], MD;  Location: [place] [facility];  Service: Cardiovascular;  Laterality: Right;   PERIPHERAL VASCULAR CATHETERIZATION  06/27/2015   Procedure: Lower Extremity Intervention;  Surgeon: Algernon [name], MD;  Location: [place] [facility];  Service: Cardiovascular;;   POLYPECTOMY  04/28/2021   Procedure: POLYPECTOMY;  Surgeon: Gatha Mayer, MD;  Location: [facility] ENDOSCOPY;  Service: Endoscopy;;   Current Outpatient Medications on File Prior to Visit  Medication Sig Dispense Refill   acetaminophen (TYLENOL) 325 MG tablet Take 2 tablets (650 mg total) by mouth every 6 (six) hours as needed for mild pain (or temp >/= 101 F).     albuterol (VENTOLIN HFA) 108 (90 Base) MCG/ACT inhaler Inhale 2 puffs into the lungs every 6 (six) hours as needed for wheezing or shortness of breath. 8 g 0   amLODipine (NORVASC) 10 MG tablet Take 1 tablet by mouth once daily 90 tablet 3   apixaban (ELIQUIS) 2.5 MG TABS tablet Take 1 tablet (2.5 mg total) by mouth 2 (two) times daily. 60 tablet 11   atenolol (TENORMIN) 25 MG tablet Take 1 tablet by mouth once daily 90 tablet 0   ferrous sulfate 325 (65 FE) MG EC tablet Take 325 mg by mouth at bedtime.     finasteride (PROSCAR) 5 MG tablet TAKE 1 TABLET BY MOUTH ONCE DAILY 90 tablet 3   furosemide (LASIX) 40 MG tablet TAKE 1 TABLET BY MOUTH ONCE DAILY AS NEEDED FOR  LEG  SWELLING 30 tablet 1   hydrALAZINE (APRESOLINE) 25 MG tablet TAKE 3 TABLETS BY MOUTH THREE TIMES DAILY 270 tablet 5   Multiple Vitamin (DAILY MULTIVITAMIN PO) Take 1 tablet by mouth daily.     ONETOUCH ULTRA test strip USE 1 STRIP TO CHECK GLUCOSE ONCE DAILY 50 each 0   pentoxifylline (TRENTAL) 400 MG CR tablet TAKE 1 TABLET BY MOUTH EVERY 12 HOURS . APPOINTMENT REQUIRED FOR FUTURE REFILLS 30 tablet 0   rosuvastatin (CRESTOR) 40 MG tablet Take 1 tablet (40 mg total) by mouth daily. 90 tablet 1   tamsulosin (FLOMAX) 0.4 MG CAPS capsule Take 1 capsule by mouth once daily 90 capsule 1   No current facility-administered medications on file prior to visit.   Allergies  Allergen Reactions   Isosorbide Mononitrate Other (See Comments)    Unknown, doesn't remember    Ramipril Cough and Other (See Comments)   Quinidine Gluconate  Rash   Quinidine Gluconate Other (See Comments) and Rash   Social History   Socioeconomic History   Marital status: Married    Spouse name: [name]   Number of children: 5   Years of education: Not on file   Highest education level: Not on file  Occupational History   Occupation: [employer]    Employer: RETIRED  Tobacco Use   Smoking status: Never   Smokeless tobacco: Former    Quit date: 1994  Scientific laboratory technician Use: Never used  Substance and Sexual Activity   Alcohol use: No   Drug use: No  Sexual activity: Never  Other Topics Concern   Not on file  Social History Narrative   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 Married x 73 years in 2022.   5 kids   11 grandchildren.   20 great grandchilren.   Social Determinants of Health   Financial Resource Strain: Low Risk  (07/24/2021)   Overall Financial Resource Strain (CARDIA)    Difficulty of Paying Living Expenses: Not hard at all  Food Insecurity: No Food Insecurity (07/24/2021)   Hunger Vital Sign    Worried About Running Out of Food in the Last Year: Never true    Ran Out of Food in the Last Year: Never true  Transportation Needs: No Transportation Needs (07/24/2021)   PRAPARE - Hydrologist (Medical): No    Lack of Transportation (Non-Medical): No  Physical Activity: Insufficiently Active (07/24/2021)   Exercise Vital Sign    Days of Exercise per Week: 3 days    Minutes of Exercise per Session: 20 min  Stress: No Stress Concern Present (07/24/2021)   [place]    Feeling of Stress : Not at all  Social Connections: [place] (07/24/2021)   Social Connection and Isolation Panel [NHANES]    Frequency of Communication with Friends and Family: More than three times a week    Frequency of Social Gatherings with Friends and Family: More than three times a week    Attends Religious  Services: More than 4 times per year    Active Member of [employer] or Organizations: Yes    Attends Archivist Meetings: More than 4 times per year    Marital Status: Married  Human resources officer Violence: Not At Risk (07/24/2021)   Humiliation, Afraid, Rape, and Kick questionnaire    Fear of Current or Ex-Partner: No    Emotionally Abused: No    Physically Abused: No    Sexually Abused: No      Review of Systems  All other systems reviewed and are negative.      Objective:   Physical Exam Vitals reviewed.  Constitutional:      Appearance: Normal appearance.  Cardiovascular:     Rate and Rhythm: Normal rate. Rhythm irregular.     Pulses: Normal pulses.     Heart sounds: Normal heart sounds.  Pulmonary:     Effort: Pulmonary effort is normal.     Breath sounds: Normal breath sounds. No wheezing, rhonchi or rales.  Abdominal:     General: Abdomen is flat.     Palpations: Abdomen is soft.  Musculoskeletal:     Right lower leg: No edema.  Neurological:     Mental Status: He is alert.           Assessment & Plan:  Type 2 diabetes mellitus with pressure callus (HCC) - Plan: CBC with Differential/Platelet, Lipid panel, COMPLETE METABOLIC PANEL WITH GFR, Hemoglobin A1c  Chronic atrial fibrillation (HCC) - Plan: CBC with Differential/Platelet, Lipid panel, COMPLETE METABOLIC PANEL WITH GFR, Hemoglobin A1c  Skin lesion of face - Plan: Ambulatory referral to Dermatology There is a lesion on his left temple.  Its about 7 mm in diameter.  It appears to be a basal cell.  It has raised rolled edges with a central ulceration.  There are telangiectasias.  Therefore [facility] consult dermatology for biopsy.  He is currently rate controlled and anticoagulated on Eliquis.  I will check a CBC to  ensure that there is no further drop in his hemoglobin.  If his hemoglobin is back above 12 we will discontinue iron.  Meanwhile I will check an A1c along with a CMP to monitor his renal function  regarding his diabetes.  Ideally I like to see his A1c below 7 and his baseline creatinine stable around 1.5.

## 2022-03-17 LAB — CBC WITH DIFFERENTIAL/PLATELET
Absolute Monocytes: 800 cells/uL (ref 200–950)
Basophils Absolute: 56 cells/uL (ref 0–200)
Basophils Relative: 0.6 %
Eosinophils Absolute: 158 cells/uL (ref 15–500)
Eosinophils Relative: 1.7 %
HCT: 37.3 % — ABNORMAL LOW (ref 38.5–50.0)
Hemoglobin: 12.2 g/dL — ABNORMAL LOW (ref 13.2–17.1)
Lymphs Abs: 3841 cells/uL (ref 850–3900)
MCH: 29.9 pg (ref 27.0–33.0)
MCHC: 32.7 g/dL (ref 32.0–36.0)
MCV: 91.4 fL (ref 80.0–100.0)
MPV: 10.1 fL (ref 7.5–12.5)
Monocytes Relative: 8.6 %
Neutro Abs: 4445 cells/uL (ref 1500–7800)
Neutrophils Relative %: 47.8 %
Platelets: 155 10*3/uL (ref 140–400)
RBC: 4.08 10*6/uL — ABNORMAL LOW (ref 4.20–5.80)
RDW: 14.4 % (ref 11.0–15.0)
Total Lymphocyte: 41.3 %
WBC: 9.3 10*3/uL (ref 3.8–10.8)

## 2022-03-17 LAB — COMPLETE METABOLIC PANEL WITH GFR
AG Ratio: 1.3 (calc) (ref 1.0–2.5)
ALT: 26 U/L (ref 9–46)
AST: 37 U/L — ABNORMAL HIGH (ref 10–35)
Albumin: 3.8 g/dL (ref 3.6–5.1)
Alkaline phosphatase (APISO): 49 U/L (ref 35–144)
BUN/Creatinine Ratio: 16 (calc) (ref 6–22)
BUN: 21 mg/dL (ref 7–25)
CO2: 26 mmol/L (ref 20–32)
Calcium: 9.4 mg/dL (ref 8.6–10.3)
Chloride: 105 mmol/L (ref 98–110)
Creat: 1.35 mg/dL — ABNORMAL HIGH (ref 0.70–1.22)
Globulin: 2.9 g/dL (calc) (ref 1.9–3.7)
Glucose, Bld: 89 mg/dL (ref 65–99)
Potassium: 4.3 mmol/L (ref 3.5–5.3)
Sodium: 139 mmol/L (ref 135–146)
Total Bilirubin: 0.5 mg/dL (ref 0.2–1.2)
Total Protein: 6.7 g/dL (ref 6.1–8.1)
eGFR: 50 mL/min/{1.73_m2} — ABNORMAL LOW (ref >=60)

## 2022-03-17 LAB — LIPID PANEL
Cholesterol: 90 mg/dL
HDL: 35 mg/dL — ABNORMAL LOW
LDL Cholesterol (Calc): 30 mg/dL
Non-HDL Cholesterol (Calc): 55 mg/dL
Total CHOL/HDL Ratio: 2.6 (calc)
Triglycerides: 168 mg/dL — ABNORMAL HIGH

## 2022-03-17 LAB — HEMOGLOBIN A1C
Hgb A1c MFr Bld: 5.4 %{Hb}
Mean Plasma Glucose: 108 mg/dL
eAG (mmol/L): 6 mmol/L

## 2022-03-27 ENCOUNTER — Other Ambulatory Visit: Payer: Self-pay | Admitting: Family Medicine

## 2022-04-06 ENCOUNTER — Telehealth: Payer: Self-pay | Admitting: Family Medicine

## 2022-04-06 NOTE — Telephone Encounter (Signed)
Patient returned messed call; unsure of who was trying to contact him or his wife.

## 2022-04-06 NOTE — Telephone Encounter (Signed)
Left message to return call to schedule cpe.

## 2022-04-07 DIAGNOSIS — D485 Neoplasm of uncertain behavior of skin: Secondary | ICD-10-CM | POA: Diagnosis not present

## 2022-04-07 DIAGNOSIS — L570 Actinic keratosis: Secondary | ICD-10-CM | POA: Diagnosis not present

## 2022-04-07 DIAGNOSIS — C44319 Basal cell carcinoma of skin of other parts of face: Secondary | ICD-10-CM | POA: Diagnosis not present

## 2022-04-09 DIAGNOSIS — I130 Hypertensive heart and chronic kidney disease with heart failure and stage 1 through stage 4 chronic kidney disease, or unspecified chronic kidney disease: Secondary | ICD-10-CM | POA: Diagnosis not present

## 2022-04-09 DIAGNOSIS — Z7984 Long term (current) use of oral hypoglycemic drugs: Secondary | ICD-10-CM | POA: Diagnosis not present

## 2022-04-09 DIAGNOSIS — I25708 Atherosclerosis of coronary artery bypass graft(s), unspecified, with other forms of angina pectoris: Secondary | ICD-10-CM | POA: Diagnosis not present

## 2022-04-09 DIAGNOSIS — E1122 Type 2 diabetes mellitus with diabetic chronic kidney disease: Secondary | ICD-10-CM | POA: Diagnosis not present

## 2022-04-09 DIAGNOSIS — Z7901 Long term (current) use of anticoagulants: Secondary | ICD-10-CM | POA: Diagnosis not present

## 2022-04-09 DIAGNOSIS — E1159 Type 2 diabetes mellitus with other circulatory complications: Secondary | ICD-10-CM | POA: Diagnosis not present

## 2022-04-09 DIAGNOSIS — Z6826 Body mass index (BMI) 26.0-26.9, adult: Secondary | ICD-10-CM | POA: Diagnosis not present

## 2022-04-09 DIAGNOSIS — Z89512 Acquired absence of left leg below knee: Secondary | ICD-10-CM | POA: Diagnosis not present

## 2022-04-09 DIAGNOSIS — I509 Heart failure, unspecified: Secondary | ICD-10-CM | POA: Diagnosis not present

## 2022-04-09 DIAGNOSIS — N183 Chronic kidney disease, stage 3 unspecified: Secondary | ICD-10-CM | POA: Diagnosis not present

## 2022-04-09 DIAGNOSIS — D692 Other nonthrombocytopenic purpura: Secondary | ICD-10-CM | POA: Diagnosis not present

## 2022-04-09 DIAGNOSIS — E785 Hyperlipidemia, unspecified: Secondary | ICD-10-CM | POA: Diagnosis not present

## 2022-04-23 ENCOUNTER — Ambulatory Visit: Payer: PPO | Admitting: Podiatry

## 2022-04-23 ENCOUNTER — Encounter: Payer: Self-pay | Admitting: Podiatry

## 2022-04-23 DIAGNOSIS — E1152 Type 2 diabetes mellitus with diabetic peripheral angiopathy with gangrene: Secondary | ICD-10-CM | POA: Diagnosis not present

## 2022-04-23 DIAGNOSIS — Q828 Other specified congenital malformations of skin: Secondary | ICD-10-CM | POA: Diagnosis not present

## 2022-04-23 DIAGNOSIS — N183 Chronic kidney disease, stage 3 unspecified: Secondary | ICD-10-CM

## 2022-04-23 DIAGNOSIS — M79674 Pain in right toe(s): Secondary | ICD-10-CM | POA: Diagnosis not present

## 2022-04-23 DIAGNOSIS — M2041 Other hammer toe(s) (acquired), right foot: Secondary | ICD-10-CM

## 2022-04-23 DIAGNOSIS — M2042 Other hammer toe(s) (acquired), left foot: Secondary | ICD-10-CM

## 2022-04-23 DIAGNOSIS — B351 Tinea unguium: Secondary | ICD-10-CM | POA: Diagnosis not present

## 2022-04-23 DIAGNOSIS — I739 Peripheral vascular disease, unspecified: Secondary | ICD-10-CM | POA: Diagnosis not present

## 2022-04-23 DIAGNOSIS — M79675 Pain in left toe(s): Secondary | ICD-10-CM

## 2022-04-23 DIAGNOSIS — Z7901 Long term (current) use of anticoagulants: Secondary | ICD-10-CM

## 2022-04-23 NOTE — Progress Notes (Signed)
This patient returns to my office for at risk foot care.  This patient requires this care by a professional since this patient will be at risk due to having PAD, PVD,DM and CKD.  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  This patient is unable to cut nails himself since the patient cannot reach his nails.These nails are painful walking and wearing shoes.  This patient presents for at risk foot care today.  General Appearance  Alert, conversant and in no acute stress.  Vascular  Dorsalis pedis and posterior tibial  pulses are weakly palpable  right.  Capillary return is within normal limits  right  Cold feet. right  Neurologic  Senn-Weinstein monofilament wire test diminished . Muscle power within normal limits right.  Nails Thick disfigured discolored nails with subungual debris  from hallux to fifth toes  right foot. No evidence of bacterial infection or drainage bilaterally.  Orthopedic  No limitations of motion  feet .  No crepitus or effusions noted.  No bony pathology or digital deformities noted.  Hammer toes 2-5  right.  Skin  normotropic skin with  noted bilaterally.  No signs of infections or ulcers noted.   Porokeratosis sub 5th met right foot.  Onychomycosis  Pain in right toes  Pain in left toes  Callus right foot.  Consent was obtained for treatment procedures.   Mechanical debridement of nails 1-5  bilaterally performed with a nail nipper.  Filed with dremel without incident. Debride porokeratosis sub 5th met right.   Return office visit   3 months                  Told patient to return for periodic foot care and evaluation due to potential at risk complications.   Gregory Mayer DPM   

## 2022-04-24 ENCOUNTER — Other Ambulatory Visit: Payer: Self-pay | Admitting: Family Medicine

## 2022-04-24 ENCOUNTER — Telehealth: Payer: Self-pay | Admitting: Pharmacist

## 2022-04-24 NOTE — Progress Notes (Signed)
  Chronic Care Management Pharmacy Assistant   Name: Eric W Chenier [name]  MRN: [id number] DOB: 05/07/1928   Reason for Encounter: Disease State - Hypertension Call    Recent office visits:  03/16/22 Warren Pickard, MD - Diabetes - Family Medicine - Labs were ordered. Referral to Dermatology. Follow up as scheduled.   Recent consult visits:  04/23/22 Gregory Mayer DPM - Podiatry - Calluses - Mechanical debridement of nails 1-5  bilaterally performed with a nail nipper.  Filed with dremel without incident. Debride porokeratosis sub 5th met right. Follow up as scheduled.  01/15/22 Kevin Patel, DPM - Porokeratosis - [facility] - debridement of the lesion done in office without complication. Follow up as scheduled.   Hospital visits:  None in previous 6 months  Medications: Outpatient Encounter Medications as of 04/24/2022  Medication Sig   acetaminophen (TYLENOL) 325 MG tablet Take 2 tablets (650 mg total) by mouth every 6 (six) hours as needed for mild pain (or temp >/= 101 F).   albuterol (VENTOLIN HFA) 108 (90 Base) MCG/ACT inhaler Inhale 2 puffs into the lungs every 6 (six) hours as needed for wheezing or shortness of breath.   amLODipine (NORVASC) 10 MG tablet Take 1 tablet by mouth once daily   apixaban (ELIQUIS) 2.5 MG TABS tablet Take 1 tablet (2.5 mg total) by mouth 2 (two) times daily.   atenolol (TENORMIN) 25 MG tablet Take 1 tablet by mouth once daily   ferrous sulfate 325 (65 FE) MG EC tablet Take 325 mg by mouth at bedtime.   finasteride (PROSCAR) 5 MG tablet TAKE 1 TABLET BY MOUTH ONCE DAILY   furosemide (LASIX) 40 MG tablet TAKE 1 TABLET BY MOUTH ONCE DAILY AS NEEDED FOR  LEG  SWELLING   hydrALAZINE (APRESOLINE) 25 MG tablet TAKE 3 TABLETS BY MOUTH THREE TIMES DAILY   Multiple Vitamin (DAILY MULTIVITAMIN PO) Take 1 tablet by mouth daily.   ONETOUCH ULTRA test strip USE 1 STRIP TO CHECK GLUCOSE ONCE DAILY   pentoxifylline (TRENTAL) 400 MG CR tablet TAKE 1 TABLET BY  MOUTH EVERY 12 HOURS . APPOINTMENT REQUIRED FOR FUTURE REFILLS   rosuvastatin (CRESTOR) 40 MG tablet Take 1 tablet (40 mg total) by mouth daily.   tamsulosin (FLOMAX) 0.4 MG CAPS capsule Take 1 capsule by mouth once daily   No facility-administered encounter medications on file as of 04/24/2022.    Current antihypertensive regimen:  Amlodipine 10mg daily  Atenolol 25mg one-half tablet po daily Hydralazine 25mg three tablets po tid   How often are you checking your Blood Pressure?  Patient reported checking blood pressures periodically throughout the day.He denies having any current concerns.   Current home BP readings: 131/69 p=51 (04/24/22)    What recent interventions/DTPs have been made by any provider to improve Blood Pressure control since last CPP Visit:  Patient reported he has not had any recent changes to his regimen.    Any recent hospitalizations or ED visits since last visit with CPP? Patient has not had any ED visits or hospitalizations since last visit with CPP   What diet changes have been made to improve Blood Pressure Control?  Patient reported he doesn't watch salt intake as good as he should but he continues to work on it   What exercise is being done to improve your Blood Pressure Control?   Patient reported he does not get a lot of exercise. He tries to be as active as he is able.      Adherence Review: Is the patient currently on ACE/ARB medication? No Does the patient have >5 day gap between last estimated fill dates? No     Care Gaps   AWV:done 07/24/21 Colonoscopy: done 04/28/21 DM Eye Exam: due 03/30/19 DM Foot Exam: due 03/01/18 Microalbumin: N/A HbgAIC: done 03/16/22 (5.7) DEXA:  N/A Mammogram: N/A     Star Rating Drugs: Rosuvastatin (CRESTOR) 40 MG tablet - last filled 02/12/22 90 days    Future Appointments  Date Time Provider Department Center  07/27/2022  9:45 AM Mayer, Gregory, DPM [facility]  07/30/2022  9:45 AM [facility]  ADVISOR [facility]    Liza Showfety, CCMA Clinical Pharmacist Assistant  [phone]   

## 2022-04-27 DIAGNOSIS — R3914 Feeling of incomplete bladder emptying: Secondary | ICD-10-CM | POA: Diagnosis not present

## 2022-04-27 DIAGNOSIS — R3912 Poor urinary stream: Secondary | ICD-10-CM | POA: Diagnosis not present

## 2022-04-27 DIAGNOSIS — R310 Gross hematuria: Secondary | ICD-10-CM | POA: Diagnosis not present

## 2022-04-27 DIAGNOSIS — R3911 Hesitancy of micturition: Secondary | ICD-10-CM | POA: Diagnosis not present

## 2022-04-27 DIAGNOSIS — N401 Enlarged prostate with lower urinary tract symptoms: Secondary | ICD-10-CM | POA: Diagnosis not present

## 2022-04-28 DIAGNOSIS — C44319 Basal cell carcinoma of skin of other parts of face: Secondary | ICD-10-CM | POA: Diagnosis not present

## 2022-05-08 ENCOUNTER — Other Ambulatory Visit: Payer: Self-pay | Admitting: Family Medicine

## 2022-05-08 NOTE — Telephone Encounter (Signed)
Requested Prescriptions  Pending Prescriptions Disp Refills  . tamsulosin (FLOMAX) 0.4 MG CAPS capsule [Pharmacy Med Name: Tamsulosin HCl 0.4 MG Oral Capsule] 90 capsule 0    Sig: Take 1 capsule by mouth once daily     Urology: Alpha-Adrenergic Blocker Failed - 05/08/2022  1:42 PM      Failed - PSA in normal range and within 360 days    PSA  Date Value Ref Range Status  12/16/2015 0.70 <=4.00 ng/mL Final    Comment:    Test Methodology: ECLIA PSA (Electrochemiluminescence Immunoassay)   For PSA values from 2.5-4.0, particularly in younger men <42 years old, the AUA and NCCN suggest testing for % Free PSA (3515) and evaluation of the rate of increase in PSA (PSA velocity).          Passed - Last BP in normal range    BP Readings from Last 1 Encounters:  03/16/22 120/64         Passed - Valid encounter within last 12 months    Recent Outpatient Visits          8 months ago [place] Eulogio Bear, NP   11 months ago Immunization due   [place] Pickard, Cammie Mcgee, MD   1 year ago Diverticulosis   [place] [name], MD   1 year ago Type 2 diabetes mellitus with diabetic peripheral angiopathy with gangrene, unspecified whether long term insulin use ([place])   [place] Pickard, Cammie Mcgee, MD   1 year ago Hematuria, unspecified type   [place] Noemi Chapel A, NP             . pentoxifylline (TRENTAL) 400 MG CR tablet [Pharmacy Med Name: Pentoxifylline ER 400 MG Oral Tablet Extended Release] 180 tablet 1    Sig: TAKE 1 TABLET BY MOUTH EVERY 12 HOURS . APPOINTMENT REQUIRED FOR FUTURE REFILLS     Hematology:  Antiplatelets - pentoxifylline Failed - 05/08/2022  1:42 PM      Failed - Cr in normal range and within 360 days    Creat  Date Value Ref Range Status  03/16/2022 1.35 (H) 0.70 - 1.22 mg/dL Final   Creatinine,U  Date Value Ref Range Status   02/19/2011 48.6 mg/dL Final   Creatinine, Urine  Date Value Ref Range Status  08/13/2016 62 20 - 370 mg/dL Final         Failed - HGB in normal range and within 180 days    Hemoglobin  Date Value Ref Range Status  03/16/2022 12.2 (L) 13.2 - 17.1 g/dL Final  12/09/2020 10.7 (L) 13.0 - 17.7 g/dL Final         Failed - HCT in normal range and within 180 days    HCT  Date Value Ref Range Status  03/16/2022 37.3 (L) 38.5 - 50.0 % Final   Hematocrit  Date Value Ref Range Status  12/09/2020 33.2 (L) 37.5 - 51.0 % Final         Failed - Valid encounter within last 6 months    Recent Outpatient Visits          8 months ago [place] Eulogio Bear, NP   11 months ago Immunization due   [place] Pickard, Cammie Mcgee, MD   1 year ago Diverticulosis   [place] Pickard, Cammie Mcgee, MD  1 year ago Type 2 diabetes mellitus with diabetic peripheral angiopathy with gangrene, unspecified whether long term insulin use ([place])   [place] Pickard, Cammie Mcgee, MD   1 year ago Hematuria, unspecified type   [place] Noemi Chapel A, NP             Passed - PLT in normal range and within 180 days    Platelets  Date Value Ref Range Status  03/16/2022 155 140 - 400 Thousand/uL Final  12/09/2020 158 150 - 450 x10E3/uL Final

## 2022-05-08 NOTE — Telephone Encounter (Signed)
Requested medications are due for refill today.  yes  Requested medications are on the active medications list.  yes  Last refill. 11/03/2021 #90 1 rf  Future visit scheduled.   no  Notes to clinic.  Missing/expired lab.    Requested Prescriptions  Pending Prescriptions Disp Refills   tamsulosin (FLOMAX) 0.4 MG CAPS capsule [Pharmacy Med Name: Tamsulosin HCl 0.4 MG Oral Capsule] 90 capsule 0    Sig: Take 1 capsule by mouth once daily     Urology: Alpha-Adrenergic Blocker Failed - 05/08/2022  1:42 PM      Failed - PSA in normal range and within 360 days    PSA  Date Value Ref Range Status  12/16/2015 0.70 <=4.00 ng/mL Final    Comment:    Test Methodology: ECLIA PSA (Electrochemiluminescence Immunoassay)   For PSA values from 2.5-4.0, particularly in younger men <105 years old, the AUA and NCCN suggest testing for % Free PSA (3515) and evaluation of the rate of increase in PSA (PSA velocity).          Passed - Last BP in normal range    BP Readings from Last 1 Encounters:  03/16/22 120/64         Passed - Valid encounter within last 12 months    Recent Outpatient Visits           8 months ago [place] Eulogio Bear, NP   11 months ago Immunization due   [place] Pickard, Cammie Mcgee, MD   1 year ago Diverticulosis   [place] [name], MD   1 year ago Type 2 diabetes mellitus with diabetic peripheral angiopathy with gangrene, unspecified whether long term insulin use ([place])   [place] Pickard, Cammie Mcgee, MD   1 year ago Hematuria, unspecified type   [facility] Eulogio Bear, NP              Signed Prescriptions Disp Refills   pentoxifylline (TRENTAL) 400 MG CR tablet 180 tablet 1    Sig: TAKE 1 TABLET BY MOUTH EVERY 12 HOURS . APPOINTMENT REQUIRED FOR FUTURE REFILLS     Hematology:  Antiplatelets - pentoxifylline Failed - 05/08/2022  1:42  PM      Failed - Cr in normal range and within 360 days    Creat  Date Value Ref Range Status  03/16/2022 1.35 (H) 0.70 - 1.22 mg/dL Final   Creatinine,U  Date Value Ref Range Status  02/19/2011 48.6 mg/dL Final   Creatinine, Urine  Date Value Ref Range Status  08/13/2016 62 20 - 370 mg/dL Final         Failed - HGB in normal range and within 180 days    Hemoglobin  Date Value Ref Range Status  03/16/2022 12.2 (L) 13.2 - 17.1 g/dL Final  12/09/2020 10.7 (L) 13.0 - 17.7 g/dL Final         Failed - HCT in normal range and within 180 days    HCT  Date Value Ref Range Status  03/16/2022 37.3 (L) 38.5 - 50.0 % Final   Hematocrit  Date Value Ref Range Status  12/09/2020 33.2 (L) 37.5 - 51.0 % Final         Failed - Valid encounter within last 6 months    Recent Outpatient Visits           8 months ago Wheezing   [place]  [facility] Eulogio Bear, NP   11 months ago Immunization due   [place] Pickard, Cammie Mcgee, MD   1 year ago Diverticulosis   [place] [name], MD   1 year ago Type 2 diabetes mellitus with diabetic peripheral angiopathy with gangrene, unspecified whether long term insulin use ([place])   [place] Pickard, Cammie Mcgee, MD   1 year ago Hematuria, unspecified type   [place] Noemi Chapel A, NP              Passed - PLT in normal range and within 180 days    Platelets  Date Value Ref Range Status  03/16/2022 155 140 - 400 Thousand/uL Final  12/09/2020 158 150 - 450 x10E3/uL Final

## 2022-05-16 ENCOUNTER — Other Ambulatory Visit: Payer: Self-pay | Admitting: Family Medicine

## 2022-05-18 ENCOUNTER — Ambulatory Visit (INDEPENDENT_AMBULATORY_CARE_PROVIDER_SITE_OTHER): Payer: PPO

## 2022-05-18 DIAGNOSIS — Z23 Encounter for immunization: Secondary | ICD-10-CM | POA: Diagnosis not present

## 2022-05-19 NOTE — Progress Notes (Signed)
Pt given flu vaccine Inj in the r-upper arm. Pt tol well. Pt left amublatroy w/no c/o .

## 2022-06-08 ENCOUNTER — Encounter (INDEPENDENT_AMBULATORY_CARE_PROVIDER_SITE_OTHER): Payer: Self-pay

## 2022-06-26 ENCOUNTER — Other Ambulatory Visit: Payer: Self-pay | Admitting: Family Medicine

## 2022-06-26 NOTE — Telephone Encounter (Signed)
Requested Prescriptions  Pending Prescriptions Disp Refills   atenolol (TENORMIN) 25 MG tablet [Pharmacy Med Name: Atenolol 25 MG Oral Tablet] 90 tablet 0    Sig: Take 1 tablet by mouth once daily     Cardiovascular: Beta Blockers 2 Failed - 06/26/2022  3:31 PM      Failed - Cr in normal range and within 360 days    Creat  Date Value Ref Range Status  03/16/2022 1.35 (H) 0.70 - 1.22 mg/dL Final   Creatinine,U  Date Value Ref Range Status  02/19/2011 48.6 mg/dL Final   Creatinine, Urine  Date Value Ref Range Status  08/13/2016 62 20 - 370 mg/dL Final         Failed - Valid encounter within last 6 months    Recent Outpatient Visits           9 months ago [place] Eulogio Bear, NP   1 year ago Immunization due   [place] Pickard, Cammie Mcgee, MD   1 year ago Diverticulosis   [place] [name], MD   1 year ago Type 2 diabetes mellitus with diabetic peripheral angiopathy with gangrene, unspecified whether long term insulin use ([place])   [place] Pickard, Cammie Mcgee, MD   1 year ago Hematuria, unspecified type   [place] Noemi Chapel A, NP              Passed - Last BP in normal range    BP Readings from Last 1 Encounters:  03/16/22 120/64         Passed - Last Heart Rate in normal range    Pulse Readings from Last 1 Encounters:  03/16/22 82

## 2022-07-10 DIAGNOSIS — Z6827 Body mass index (BMI) 27.0-27.9, adult: Secondary | ICD-10-CM | POA: Diagnosis not present

## 2022-07-10 DIAGNOSIS — I70203 Unspecified atherosclerosis of native arteries of extremities, bilateral legs: Secondary | ICD-10-CM | POA: Diagnosis not present

## 2022-07-10 DIAGNOSIS — D692 Other nonthrombocytopenic purpura: Secondary | ICD-10-CM | POA: Diagnosis not present

## 2022-07-10 DIAGNOSIS — Z515 Encounter for palliative care: Secondary | ICD-10-CM | POA: Diagnosis not present

## 2022-07-10 DIAGNOSIS — Z9861 Coronary angioplasty status: Secondary | ICD-10-CM | POA: Diagnosis not present

## 2022-07-10 DIAGNOSIS — Z7901 Long term (current) use of anticoagulants: Secondary | ICD-10-CM | POA: Diagnosis not present

## 2022-07-10 DIAGNOSIS — I2510 Atherosclerotic heart disease of native coronary artery without angina pectoris: Secondary | ICD-10-CM | POA: Diagnosis not present

## 2022-07-10 DIAGNOSIS — Z89512 Acquired absence of left leg below knee: Secondary | ICD-10-CM | POA: Diagnosis not present

## 2022-07-10 DIAGNOSIS — Z951 Presence of aortocoronary bypass graft: Secondary | ICD-10-CM | POA: Diagnosis not present

## 2022-07-13 DIAGNOSIS — S0031XD Abrasion of nose, subsequent encounter: Secondary | ICD-10-CM | POA: Diagnosis not present

## 2022-07-13 DIAGNOSIS — Z89512 Acquired absence of left leg below knee: Secondary | ICD-10-CM | POA: Diagnosis not present

## 2022-07-13 DIAGNOSIS — S0081XD Abrasion of other part of head, subsequent encounter: Secondary | ICD-10-CM | POA: Diagnosis not present

## 2022-07-13 DIAGNOSIS — S8991XD Unspecified injury of right lower leg, subsequent encounter: Secondary | ICD-10-CM | POA: Diagnosis not present

## 2022-07-13 DIAGNOSIS — W19XXXD Unspecified fall, subsequent encounter: Secondary | ICD-10-CM | POA: Diagnosis not present

## 2022-07-14 ENCOUNTER — Telehealth: Payer: Self-pay | Admitting: Family Medicine

## 2022-07-14 NOTE — Telephone Encounter (Signed)
Received call from patient's son [name] to follow up on patient's fall on 07/12/22. [name] reports patient's R leg was swollen on Monday,07/13/22 when visiting nurse came to examine him. [name] stated visiting nurse would arrange for a mobile x-ray for the patient, so he called to ask if films would be sent to the orthopedic provider the patient has seen in the past.   Please advise [name] at [phone].

## 2022-07-14 NOTE — Telephone Encounter (Signed)
Returned missed call to Dr. Cathie Olden. Spoke with her for an update. She stated she doesn't see a need for an MRI so she will send a request for an xray of the right knee and femur. If  the x-ray doesn't show anything and the patient continues to experience sx, she stated [name] can either refer him to an orthopedist or order additional imaging at his discretion.  Dr. Cathie Olden stated it's ok to call her for additional details and information at [phone].

## 2022-07-15 DIAGNOSIS — M25561 Pain in right knee: Secondary | ICD-10-CM | POA: Diagnosis not present

## 2022-07-15 DIAGNOSIS — M79651 Pain in right thigh: Secondary | ICD-10-CM | POA: Diagnosis not present

## 2022-07-16 DIAGNOSIS — S8991XD Unspecified injury of right lower leg, subsequent encounter: Secondary | ICD-10-CM | POA: Diagnosis not present

## 2022-07-16 DIAGNOSIS — S0081XD Abrasion of other part of head, subsequent encounter: Secondary | ICD-10-CM | POA: Diagnosis not present

## 2022-07-16 DIAGNOSIS — W19XXXD Unspecified fall, subsequent encounter: Secondary | ICD-10-CM | POA: Diagnosis not present

## 2022-07-16 DIAGNOSIS — S0031XD Abrasion of nose, subsequent encounter: Secondary | ICD-10-CM | POA: Diagnosis not present

## 2022-07-20 DIAGNOSIS — S8991XD Unspecified injury of right lower leg, subsequent encounter: Secondary | ICD-10-CM | POA: Diagnosis not present

## 2022-07-27 ENCOUNTER — Telehealth: Payer: Self-pay | Admitting: Pharmacist

## 2022-07-27 ENCOUNTER — Encounter: Payer: Self-pay | Admitting: Podiatry

## 2022-07-27 ENCOUNTER — Ambulatory Visit (INDEPENDENT_AMBULATORY_CARE_PROVIDER_SITE_OTHER): Payer: PPO | Admitting: Podiatry

## 2022-07-27 DIAGNOSIS — B351 Tinea unguium: Secondary | ICD-10-CM

## 2022-07-27 DIAGNOSIS — M79674 Pain in right toe(s): Secondary | ICD-10-CM | POA: Diagnosis not present

## 2022-07-27 DIAGNOSIS — Q828 Other specified congenital malformations of skin: Secondary | ICD-10-CM | POA: Diagnosis not present

## 2022-07-27 DIAGNOSIS — N183 Chronic kidney disease, stage 3 unspecified: Secondary | ICD-10-CM

## 2022-07-27 DIAGNOSIS — I739 Peripheral vascular disease, unspecified: Secondary | ICD-10-CM

## 2022-07-27 DIAGNOSIS — Z7901 Long term (current) use of anticoagulants: Secondary | ICD-10-CM

## 2022-07-27 DIAGNOSIS — M79675 Pain in left toe(s): Secondary | ICD-10-CM | POA: Diagnosis not present

## 2022-07-27 NOTE — Progress Notes (Signed)
This patient returns to my office for at risk foot care.  This patient requires this care by a professional since this patient will be at risk due to having PAD, PVD,DM and CKD. Patient has BKA left foot. 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  This patient is unable to cut nails himself since the patient cannot reach his nails.These nails are painful walking and wearing shoes.  This patient presents for at risk foot care today.  General Appearance  Alert, conversant and in no acute stress.  Vascular  Dorsalis pedis and posterior tibial  pulses are weakly palpable  right.  Capillary return is within normal limits  right  Cold feet. right  Neurologic  Senn-Weinstein monofilament wire test diminished . Muscle power within normal limits right.  Nails Thick disfigured discolored nails with subungual debris  from hallux to fifth toes  right foot. No evidence of bacterial infection or drainage bilaterally.  Orthopedic  No limitations of motion  feet .  No crepitus or effusions noted.  No bony pathology or digital deformities noted.  Hammer toes 2-5  right.  Skin  normotropic skin with  noted bilaterally.  No signs of infections or ulcers noted.   Porokeratosis sub 5th met right foot.  Onychomycosis  Pain in right toes  Pain in left toes  Callus right foot.  Consent was obtained for treatment procedures.   Mechanical debridement of nails 1-5  bilaterally performed with a nail nipper.  Filed with dremel without incident. Debride porokeratosis sub 5th met right.   Return office visit   3 months                  Told patient to return for periodic foot care and evaluation due to potential at risk complications.   Gregory Mayer DPM   

## 2022-07-27 NOTE — Progress Notes (Signed)
Chronic Care Management Pharmacy Assistant   Name: Eric [name].  MRN: [id number] DOB: March 31, 1928   Reason for Encounter: Disease State - Hypertension Call      Recent office visits:  None noted.   Recent consult visits:  None noted.  Hospital visits:  None in previous 6 months  Medications: Outpatient Encounter Medications as of 07/27/2022  Medication Sig   acetaminophen (TYLENOL) 325 MG tablet Take 2 tablets (650 mg total) by mouth every 6 (six) hours as needed for mild pain (or temp >/= 101 F).   albuterol (VENTOLIN HFA) 108 (90 Base) MCG/ACT inhaler Inhale 2 puffs into the lungs every 6 (six) hours as needed for wheezing or shortness of breath.   amLODipine (NORVASC) 10 MG tablet Take 1 tablet by mouth once daily   apixaban (ELIQUIS) 2.5 MG TABS tablet Take 1 tablet (2.5 mg total) by mouth 2 (two) times daily.   atenolol (TENORMIN) 25 MG tablet Take 1 tablet by mouth once daily   ferrous sulfate 325 (65 FE) MG EC tablet Take 325 mg by mouth at bedtime.   finasteride (PROSCAR) 5 MG tablet TAKE 1 TABLET BY MOUTH ONCE DAILY   furosemide (LASIX) 40 MG tablet TAKE 1 TABLET BY MOUTH ONCE DAILY AS NEEDED FOR  LEG  SWELLING   hydrALAZINE (APRESOLINE) 25 MG tablet TAKE 3 TABLETS BY MOUTH THREE TIMES DAILY   Multiple Vitamin (DAILY MULTIVITAMIN PO) Take 1 tablet by mouth daily.   ONETOUCH ULTRA test strip USE 1 STRIP TO CHECK GLUCOSE ONCE DAILY   pentoxifylline (TRENTAL) 400 MG CR tablet TAKE 1 TABLET BY MOUTH EVERY 12 HOURS . APPOINTMENT REQUIRED FOR FUTURE REFILLS   rosuvastatin (CRESTOR) 40 MG tablet Take 1 tablet by mouth once daily   tamsulosin (FLOMAX) 0.4 MG CAPS capsule Take 1 capsule by mouth once daily   No facility-administered encounter medications on file as of 07/27/2022.    Current antihypertensive regimen:  Amlodipine '10mg'$  daily  Atenolol '25mg'$  one-half tablet po daily Hydralazine '25mg'$  three tablets po tid   How often are you checking your Blood  Pressure?  Patient reported checking blood pressures  Current home BP readings:     What recent interventions/DTPs have been made by any provider to improve Blood Pressure control since last CPP Visit:  Patient denied any medication changes since last visit with CPP    Any recent hospitalizations or ED visits since last visit with CPP? Patient has not had any hospitalizations or ED visits since last CPP   What diet changes have been made to improve Blood Pressure Control?  Patient reported he has been limiting his salt intake in his diet.   What exercise is being done to improve your Blood Pressure Control?  Patient reported  trying to remain active when able.   Adherence Review: Is the patient currently on ACE/ARB medication? No Does the patient have >5 day gap between last estimated fill dates?  No    Care Gaps   MBW:GYKZ 07/24/21 Colonoscopy: done 04/28/21 DM Eye Exam: due 03/30/19 DM Foot Exam: due 03/01/18 Microalbumin: N/A HbgAIC: done 03/16/22 (5.7) DEXA:  N/A Mammogram: N/A     Star Rating Drugs: Rosuvastatin (CRESTOR) 40 MG tablet - last filled 05/18/22 90 days     Future Appointments  Date Time Provider [place]  07/27/2022  9:45 AM Gardiner Barefoot, DPM [facility]  07/29/2022  9:30 AM [facility]-NURSE HEALTH ADVISOR [facility] [place], [place] Clinical Pharmacist  Assistant  [phone]

## 2022-07-29 ENCOUNTER — Other Ambulatory Visit: Payer: Self-pay | Admitting: Family Medicine

## 2022-07-29 DIAGNOSIS — Z89612 Acquired absence of left leg above knee: Secondary | ICD-10-CM | POA: Diagnosis not present

## 2022-07-29 DIAGNOSIS — I509 Heart failure, unspecified: Secondary | ICD-10-CM | POA: Diagnosis not present

## 2022-07-31 ENCOUNTER — Encounter: Payer: Self-pay | Admitting: Nurse Practitioner

## 2022-07-31 ENCOUNTER — Ambulatory Visit: Payer: PPO | Attending: Nurse Practitioner | Admitting: Nurse Practitioner

## 2022-07-31 VITALS — BP 140/61 | HR 67 | Ht 65.5 in | Wt 163.0 lb

## 2022-07-31 DIAGNOSIS — R609 Edema, unspecified: Secondary | ICD-10-CM

## 2022-07-31 DIAGNOSIS — I2510 Atherosclerotic heart disease of native coronary artery without angina pectoris: Secondary | ICD-10-CM

## 2022-07-31 DIAGNOSIS — I4821 Permanent atrial fibrillation: Secondary | ICD-10-CM | POA: Diagnosis not present

## 2022-07-31 DIAGNOSIS — E1152 Type 2 diabetes mellitus with diabetic peripheral angiopathy with gangrene: Secondary | ICD-10-CM | POA: Diagnosis not present

## 2022-07-31 DIAGNOSIS — E785 Hyperlipidemia, unspecified: Secondary | ICD-10-CM

## 2022-07-31 DIAGNOSIS — N183 Chronic kidney disease, stage 3 unspecified: Secondary | ICD-10-CM

## 2022-07-31 DIAGNOSIS — I10 Essential (primary) hypertension: Secondary | ICD-10-CM | POA: Diagnosis not present

## 2022-07-31 DIAGNOSIS — I509 Heart failure, unspecified: Secondary | ICD-10-CM | POA: Diagnosis not present

## 2022-07-31 DIAGNOSIS — I739 Peripheral vascular disease, unspecified: Secondary | ICD-10-CM

## 2022-07-31 NOTE — Progress Notes (Signed)
Office Visit    Patient Name: Eric [name]. Date of Encounter: 07/31/2022  Primary Care Provider:  [name], MD Primary Cardiologist:  Dorris Carnes, MD  Chief Complaint    88 year old male with a history of CAD s/p CABG x 3 (LIMA-LAD, SVG-diagonal, SVG-PDA) in 2000, PTCA-LAD in 2003, permanent atrial fibrillation, PAD s/p L BKA, hypertension, hyperlipidemia, CKD stage III, and type 2 diabetes who presents for follow-up related to CAD and atrial fibrillation.   Past Medical History    Past Medical History:  Diagnosis Date   Atrial flutter ([place])    s/p RFCA   CAD (coronary artery disease)    cath 2003, occluded S-RCA, occluded S-Dx, L-LAD ok, s/p PTCA to LAD   Cataract    CKD (chronic kidney disease) stage 3, GFR 30-59 ml/min (HCC)    Diabetes mellitus    diet controlled   Hearing aid worn    bilateral   History of kidney stones    Long term (current) use of anticoagulants    Neuromuscular disorder (HCC)    OSA (obstructive sleep apnea) 12/11   very mild, AHI 7/hr   Persistent atrial fibrillation ([place]) 09/26/2015   Pure hyperglyceridemia    PVD (peripheral vascular disease) ([place])    angioplasty of his right lower extremity in [place] by Dr.[name] 2013   Unspecified essential hypertension    Wears dentures    full upper and lower   Past Surgical History:  Procedure Laterality Date   ABDOMINAL AORTOGRAM W/LOWER EXTREMITY N/A 11/15/2017   Procedure: ABDOMINAL AORTOGRAM W/LOWER EXTREMITY;  Surgeon: [name], MD;  Location: [place] [facility];  Service: Cardiovascular;  Laterality: N/A;   Adenosine Myoview  3/06   EF 56%, neg. Ischemia   Adenosine Myoview  02/18/07   nml   AMPUTATION Left 12/15/2017   Procedure: AMPUTATION BELOW KNEE;  Surgeon: Algernon [name], MD;  Location: [facility];  Service: Vascular;  Laterality: Left;   ANGIOPLASTY  1/99   CAD- diogonal with rotational artherectomy   Arthrectomy     of LAD & PTCA   BLEPHAROPLASTY Bilateral     CARDIAC CATHETERIZATION  1/00   CARDIOVERSION  1/04   CARDIOVERSION  5/07   hospital- a flutter   CARPAL TUNNEL RELEASE     ? bilateral   CATARACT EXTRACTION W/PHACO Right 07/28/2017   Procedure: CATARACT EXTRACTION PHACO AND INTRAOCULAR LENS PLACEMENT ([place]) RIGHT DIABETIC;  Surgeon: [name], MD;  Location: [place];  Service: Ophthalmology;  Laterality: Right;  Diabetic - diet controlled   CATARACT EXTRACTION W/PHACO Left 08/18/2017   Procedure: CATARACT EXTRACTION PHACO AND INTRAOCULAR LENS PLACEMENT ([facility]);  Surgeon: [name], MD;  Location: [place];  Service: Ophthalmology;  Laterality: Left;  DIABETES - oral meds   COLONOSCOPY W/ BIOPSIES  10/01/06   sigmoid polyp bx neg, 3 years   COLONOSCOPY WITH PROPOFOL N/A 04/28/2021   Procedure: COLONOSCOPY WITH PROPOFOL;  Surgeon: Gatha Mayer, MD;  Location: [facility] ENDOSCOPY;  Service: Endoscopy;  Laterality: N/A;   CORONARY ANGIOPLASTY  4/03   cutting balloon PTCA pLAD into Diag   CORONARY ARTERY BYPASS GRAFT  2000   LIMA-LAD, SVG-RCA, SVG-Diag; SVG-Diag & SVG-RCA occluded 2003   FRACTURE SURGERY     HAND SURGERY  08/27/09   R thumb procedure wit Scaphoid Gragt and screws, Dr [name]   LOWER EXTREMITY ANGIOGRAPHY Right 01/25/2019   Procedure: LOWER EXTREMITY ANGIOGRAPHY;  Surgeon: Katha Cabal, MD;  [facility];  Service: Cardiovascular;  Laterality: Right;   NM MYOVIEW LTD  4/11   normal   PERIPHERAL VASCULAR CATHETERIZATION Right 06/27/2015   Procedure: Lower Extremity Angiography;  Surgeon: Algernon [name], MD;  Location: [place] [facility];  Service: Cardiovascular;  Laterality: Right;   PERIPHERAL VASCULAR CATHETERIZATION  06/27/2015   Procedure: Lower Extremity Intervention;  Surgeon: Algernon [name], MD;  Location: [place] [facility];  Service: Cardiovascular;;   POLYPECTOMY  04/28/2021   Procedure: POLYPECTOMY;  Surgeon: Gatha Mayer, MD;  Location: [facility] ENDOSCOPY;   Service: Endoscopy;;    Allergies  Allergies  Allergen Reactions   Isosorbide Mononitrate Other (See Comments)    Unknown, doesn't remember    Ramipril Cough and Other (See Comments)   Quinidine Gluconate Rash   Quinidine Gluconate Other (See Comments) and Rash    History of Present Illness    88 year old male with the above past medical history including CAD s/p CABG x 3 (LIMA-LAD, SVG-diagonal, SVG-PDA) in 2000, PTCA-LAD in 2003, permanent atrial fibrillation, PAD s/p L BKA (follows at [facility]), hypertension, hyperlipidemia, CKD stage III, and type 2 diabetes.  He was last seen in the office on 12/09/2020 and was stable from a cardiac standpoint.  Most recent echocardiogram in 02/2021 showed EF 60 to 65%, no RWMA, normal LV function, mild LVH, normal RV systolic function, mildly elevated PASP, severely dilated left atrium, moderately dilated right atrium, mild to moderate mitral valve regurgitation, mild to moderate tricuspid valve regurgitation, trivial aortic valve regurgitation.  He presents today for follow-up accompanied by his son.  Since his last visit he has been stable from a cardiac standpoint he has noted an increase in R lower extremity edema.  His home health agency increased his Lasix to 40 mg daily (he was previously taking it as needed).  He has noticed an improvement in his swelling.  He denies symptoms concerning for angina, denies dyspnea, PND, orthopnea, weight gain.  Other than his lower extremity edema, he denies any additional concerns today.  Home Medications    Current Outpatient Medications  Medication Sig Dispense Refill   acetaminophen (TYLENOL) 325 MG tablet Take 2 tablets (650 mg total) by mouth every 6 (six) hours as needed for mild pain (or temp >/= 101 F).     amLODipine (NORVASC) 10 MG tablet Take 1 tablet by mouth once daily 90 tablet 3   apixaban (ELIQUIS) 2.5 MG TABS tablet Take 1 tablet (2.5 mg total) by mouth 2 (two) times daily. 60 tablet 11   atenolol  (TENORMIN) 25 MG tablet Take 1 tablet by mouth once daily 90 tablet 0   ferrous sulfate 325 (65 FE) MG EC tablet Take 325 mg by mouth at bedtime.     finasteride (PROSCAR) 5 MG tablet TAKE 1 TABLET BY MOUTH ONCE DAILY 90 tablet 3   furosemide (LASIX) 40 MG tablet TAKE 1 TABLET BY MOUTH ONCE DAILY AS NEEDED FOR  LEG  SWELLING (Patient taking differently: Take 40 mg daily. May take an additional 20 mg on the days of swelling or shortness of breath.) 30 tablet 0   hydrALAZINE (APRESOLINE) 25 MG tablet TAKE 3 TABLETS BY MOUTH THREE TIMES DAILY 270 tablet 5   Multiple Vitamin (DAILY MULTIVITAMIN PO) Take 1 tablet by mouth daily.     ONETOUCH ULTRA test strip USE 1 STRIP TO CHECK GLUCOSE ONCE DAILY 50 each 0   pentoxifylline (TRENTAL) 400 MG CR tablet TAKE 1 TABLET BY MOUTH EVERY 12  HOURS . APPOINTMENT REQUIRED FOR FUTURE REFILLS 180 tablet 1   rosuvastatin (CRESTOR) 40 MG tablet Take 1 tablet by mouth once daily 90 tablet 0   tamsulosin (FLOMAX) 0.4 MG CAPS capsule Take 1 capsule by mouth once daily 90 capsule 1   No current facility-administered medications for this visit.     Review of Systems    He denies chest pain, palpitations, dyspnea, pnd, orthopnea, n, v, dizziness, syncope, weight gain, or early satiety. All other systems reviewed and are otherwise negative except as noted above.   Physical Exam    VS:  BP (!) 140/61   Pulse 67   Ht 5' 5.5" (1.664 m)   Wt 163 lb (73.9 kg)   SpO2 96%   BMI 26.71 kg/m  GEN: Well nourished, well developed, in no acute distress. HEENT: normal. Neck: Supple, no JVD, carotid bruits, or masses. Cardiac: IRIR, no murmurs, rubs, or gallops. No clubbing, cyanosis, 1+ pitting right lower extremity edema.  Radials/DP/PT 2+ and equal bilaterally.  Respiratory:  Respirations regular and unlabored, clear to auscultation bilaterally. GI: Soft, nontender, nondistended, BS + x 4. MS: L BKA, otherwise no deformity or atrophy. Skin: warm and dry, no rash. Neuro:   Strength and sensation are intact. Psych: Normal affect.  Accessory Clinical Findings    ECG personally reviewed by me today -atrial fibrillation, 67 bpm- no acute changes.   Lab Results  Component Value Date   WBC 9.3 03/16/2022   HGB 12.2 (L) 03/16/2022   HCT 37.3 (L) 03/16/2022   MCV 91.4 03/16/2022   PLT 155 03/16/2022   Lab Results  Component Value Date   CREATININE 1.35 (H) 03/16/2022   BUN 21 03/16/2022   NA 139 03/16/2022   K 4.3 03/16/2022   CL 105 03/16/2022   CO2 26 03/16/2022   Lab Results  Component Value Date   ALT 26 03/16/2022   AST 37 (H) 03/16/2022   ALKPHOS 52 04/29/2021   BILITOT 0.5 03/16/2022   Lab Results  Component Value Date   CHOL 90 03/16/2022   HDL 35 (L) 03/16/2022   LDLCALC 30 03/16/2022   LDLDIRECT 99.1 02/19/2011   TRIG 168 (H) 03/16/2022   CHOLHDL 2.6 03/16/2022    Lab Results  Component Value Date   HGBA1C 5.4 03/16/2022    Assessment & Plan    1. Lower extremity edema: He notes a recent increase in right lower extremity edema, improved with increased Lasix dosing.  He denies palpitations, dyspnea, PND, orthopnea, weight gain.  Will increase Lasix to 40 mg daily.  He may take an additional 40 mg (for total of 80 mg daily) as needed for increased swelling, weight gain.  Discussed ED precautions.  Will check BNP, BMET today.  Continue atenolol, Lasix.  2. CAD: S/p CABG x 3 in 2000, PTCA-LAD in 2003.  Stable with no anginal symptoms, no indication for ischemic evaluation at this time.  Continue amlodipine, atenolol, hydralazine, Crestor.  3. Permanent atrial fibrillation: Rate controlled, continue atenolol, Eliquis.  4. PAD: S/p L BKA.  Follows with [facility].  5. Hypertension: BP well controlled. Continue current antihypertensive regimen.   6. Hyperlipidemia: LDL was 30 in 03/2022.  Continue Crestor.  7. CKD stage III: Creatinine was 1.35 in 03/2022.  Repeat BMET pending as above.  8. Type 2 diabetes: A1c was 5.4 in 03/2022.   Monitor managed per PCP.  9. Disposition: Follow-up in 2 months.  Lenna Sciara, NP 07/31/2022, 6:25 PM

## 2022-07-31 NOTE — Patient Instructions (Addendum)
Medication Instructions:  Lasix 40 mg daily. May take an additional 20 mg on the days of increased weight gain of 3 lb over night or 5 lb in 1 week and shortness of breath.   *If you need a refill on your cardiac medications before your next appointment, please call your pharmacy*   Lab Work: Your physician recommends that you complete lab work today. BMET & BNP   If you have labs (blood work) drawn today and your tests are completely normal, you will receive your results only by: [place] (if you have MyChart) OR A paper copy in the mail If you have any lab test that is abnormal or we need to change your treatment, we will call you to review the results.   Testing/Procedures: NONE ordered at this time of appointment     Follow-Up: At [facility], you and your health needs are our priority.  As part of our continuing mission to provide you with exceptional heart care, we have created designated Provider Care Teams.  These Care Teams include your primary Cardiologist (physician) and Advanced Practice Providers (APPs -  Physician Assistants and Nurse Practitioners) who all work together to provide you with the care you need, when you need it.  We recommend signing up for the patient portal called "MyChart".  Sign up information is provided on this After Visit Summary.  MyChart is used to connect with patients for Virtual Visits (Telemedicine).  Patients are able to view lab/test results, encounter notes, upcoming appointments, etc.  Non-urgent messages can be sent to your provider as well.   To learn more about what you can do with MyChart, go to [email or web address].    Your next appointment:   2 month(s)  The format for your next appointment:   In Person  Provider:   Any APP     Other Instructions   Important Information About Sugar

## 2022-08-06 DIAGNOSIS — I10 Essential (primary) hypertension: Secondary | ICD-10-CM | POA: Diagnosis not present

## 2022-08-06 DIAGNOSIS — I4821 Permanent atrial fibrillation: Secondary | ICD-10-CM | POA: Diagnosis not present

## 2022-08-06 DIAGNOSIS — N183 Chronic kidney disease, stage 3 unspecified: Secondary | ICD-10-CM | POA: Diagnosis not present

## 2022-08-06 DIAGNOSIS — E1152 Type 2 diabetes mellitus with diabetic peripheral angiopathy with gangrene: Secondary | ICD-10-CM | POA: Diagnosis not present

## 2022-08-06 DIAGNOSIS — I739 Peripheral vascular disease, unspecified: Secondary | ICD-10-CM | POA: Diagnosis not present

## 2022-08-06 DIAGNOSIS — I2510 Atherosclerotic heart disease of native coronary artery without angina pectoris: Secondary | ICD-10-CM | POA: Diagnosis not present

## 2022-08-06 DIAGNOSIS — E785 Hyperlipidemia, unspecified: Secondary | ICD-10-CM | POA: Diagnosis not present

## 2022-08-07 LAB — BRAIN NATRIURETIC PEPTIDE: BNP: 319.5 pg/mL — ABNORMAL HIGH (ref 0.0–100.0)

## 2022-08-07 LAB — BASIC METABOLIC PANEL
BUN/Creatinine Ratio: 15 (ref 10–24)
BUN: 18 mg/dL (ref 10–36)
CO2: 22 mmol/L (ref 20–29)
Calcium: 9 mg/dL (ref 8.6–10.2)
Chloride: 101 mmol/L (ref 96–106)
Creatinine, Ser: 1.18 mg/dL (ref 0.76–1.27)
Glucose: 166 mg/dL — ABNORMAL HIGH (ref 70–99)
Potassium: 4.2 mmol/L (ref 3.5–5.2)
Sodium: 137 mmol/L (ref 134–144)
eGFR: 58 mL/min/{1.73_m2} — ABNORMAL LOW (ref >59)

## 2022-08-12 ENCOUNTER — Ambulatory Visit (INDEPENDENT_AMBULATORY_CARE_PROVIDER_SITE_OTHER): Payer: PPO

## 2022-08-12 VITALS — BP 120/68 | HR 75 | Ht 65.0 in | Wt 162.4 lb

## 2022-08-12 DIAGNOSIS — Z0000 Encounter for general adult medical examination without abnormal findings: Secondary | ICD-10-CM | POA: Diagnosis not present

## 2022-08-12 NOTE — Patient Instructions (Signed)
[name] , Thank you for taking time to come for your Medicare Wellness Visit. I appreciate your ongoing commitment to your health goals. Please review the following plan we discussed and let me know if I can assist you in the future.   These are the goals we discussed:  Goals      Exercise 3x per week (30 min per time)     Try to increase exercise as tolerated.      Manage My Medicine     Timeframe:  Long-Range Goal Priority:  High Start Date:  10/11/20                           Expected End Date: 04/13/21                      Follow Up Date 12/07/20   - call for medicine refill 2 or 3 days before it runs out - keep a list of all the medicines I take; vitamins and herbals too - use a pillbox to sort medicine    Why is this important?   These steps will help you keep on track with your medicines.   Notes: 11/29/20 - still has not met 3% OOP     Track and Manage My Blood Pressure-Hypertension     Timeframe:  Long-Range Goal Priority:  High Start Date:   11/29/20                          Expected End Date:    05/31/21                   Follow Up Date 07/04/22   - check blood pressure daily - choose a place to take my blood pressure (home, clinic or office, retail store) - write blood pressure results in a log or diary    Why is this important?   You won't feel high blood pressure, but it can still hurt your blood vessels.  High blood pressure can cause heart or kidney problems. It can also cause a stroke.  Making lifestyle changes like losing a little weight or eating less salt will help.  Checking your blood pressure at home and at different times of the day can help to control blood pressure.  If the doctor prescribes medicine remember to take it the way the doctor ordered.  Call the office if you cannot afford the medicine or if there are questions about it.     Notes:   Controlled as of 01/01/22        This is a list of the screening recommended for you and  due dates:  Health Maintenance  Topic Date Due   COVID-19 Vaccine (3 - Pfizer risk series) 10/09/2022*   Hemoglobin A1C  09/16/2022   Eye exam for diabetics  10/31/2022   Complete foot exam   04/24/2023   Medicare Annual Wellness Visit  08/13/2023   DTaP/Tdap/Td vaccine (3 - Td or Tdap) 04/13/2029   Pneumonia Vaccine  Completed   Flu Shot  Completed   HPV Vaccine  Aged Out   Zoster (Shingles) Vaccine  Discontinued  *Topic was postponed. The date shown is not the original due date.    Advanced directives: COPY IN PT'S CHART.  Conditions/risks identified: Aim for 30 minutes of exercise or brisk walking, 6-8 glasses of water, and 5 servings of fruits and vegetables each  day.   Next appointment: Follow up in one year for your annual wellness visit. 08/2023.  Preventive Care 28 Years and Older, Male  Preventive care refers to lifestyle choices and visits with your health care provider that can promote health and wellness. What does preventive care include? A yearly physical exam. This is also called an annual well check. Dental exams once or twice a year. Routine eye exams. Ask your health care provider how often you should have your eyes checked. Personal lifestyle choices, including: Daily care of your teeth and gums. Regular physical activity. Eating a healthy diet. Avoiding tobacco and drug use. Limiting alcohol use. Practicing safe sex. Taking low doses of aspirin every day. Taking vitamin and mineral supplements as recommended by your health care provider. What happens during an annual well check? The services and screenings done by your health care provider during your annual well check will depend on your age, overall health, lifestyle risk factors, and family history of disease. Counseling  Your health care provider may ask you questions about your: Alcohol use. Tobacco use. Drug use. Emotional well-being. Home and relationship well-being. Sexual activity. Eating  habits. History of falls. Memory and ability to understand (cognition). Work and work Statistician. Screening  You may have the following tests or measurements: Height, weight, and BMI. Blood pressure. Lipid and cholesterol levels. These may be checked every 5 years, or more frequently if you are over 54 years old. Skin check. Lung cancer screening. You may have this screening every year starting at age 28 if you have a 30-pack-year history of smoking and currently smoke or have quit within the past 15 years. Fecal occult blood test (FOBT) of the stool. You may have this test every year starting at age 39. Flexible sigmoidoscopy or colonoscopy. You may have a sigmoidoscopy every 5 years or a colonoscopy every 10 years starting at age 76. Prostate cancer screening. Recommendations will vary depending on your family history and other risks. Hepatitis C blood test. Hepatitis B blood test. Sexually transmitted disease (STD) testing. Diabetes screening. This is done by checking your blood sugar (glucose) after you have not eaten for a while (fasting). You may have this done every 1-3 years. Abdominal aortic aneurysm (AAA) screening. You may need this if you are a current or former smoker. Osteoporosis. You may be screened starting at age 62 if you are at high risk. Talk with your health care provider about your test results, treatment options, and if necessary, the need for more tests. Vaccines  Your health care provider may recommend certain vaccines, such as: Influenza vaccine. This is recommended every year. Tetanus, diphtheria, and acellular pertussis (Tdap, Td) vaccine. You may need a Td booster every 10 years. Zoster vaccine. You may need this after age 31. Pneumococcal 13-valent conjugate (PCV13) vaccine. One dose is recommended after age 1. Pneumococcal polysaccharide (PPSV23) vaccine. One dose is recommended after age 38. Talk to your health care provider about which screenings and  vaccines you need and how often you need them. This information is not intended to replace advice given to you by your health care provider. Make sure you discuss any questions you have with your health care provider. Document Released: 08/23/2015 Document Revised: 04/15/2016 Document Reviewed: 05/28/2015 Elsevier Interactive Patient Education  2017 [employer] Prevention in the Home Falls can cause injuries. They can happen to people of all ages. There are many things you can do to make your home safe and to help prevent falls.  What can I do on the outside of my home? Regularly fix the edges of walkways and driveways and fix any cracks. Remove anything that might make you trip as you walk through a door, such as a raised step or threshold. Trim any bushes or trees on the path to your home. Use bright outdoor lighting. Clear any walking paths of anything that might make someone trip, such as rocks or tools. Regularly check to see if handrails are loose or broken. Make sure that both sides of any steps have handrails. Any raised decks and porches should have guardrails on the edges. Have any leaves, snow, or ice cleared regularly. Use sand or salt on walking paths during winter. Clean up any spills in your garage right away. This includes oil or grease spills. What can I do in the bathroom? Use night lights. Install grab bars by the toilet and in the tub and shower. Do not use towel bars as grab bars. Use non-skid mats or decals in the tub or shower. If you need to sit down in the shower, use a plastic, non-slip stool. Keep the floor dry. Clean up any water that spills on the floor as soon as it happens. Remove soap buildup in the tub or shower regularly. Attach bath mats securely with double-sided non-slip rug tape. Do not have throw rugs and other things on the floor that can make you trip. What can I do in the bedroom? Use night lights. Make sure that you have a light by your  bed that is easy to reach. Do not use any sheets or blankets that are too big for your bed. They should not hang down onto the floor. Have a firm chair that has side arms. You can use this for support while you get dressed. Do not have throw rugs and other things on the floor that can make you trip. What can I do in the kitchen? Clean up any spills right away. Avoid walking on wet floors. Keep items that you use a lot in easy-to-reach places. If you need to reach something above you, use a strong step stool that has a grab bar. Keep electrical cords out of the way. Do not use floor polish or wax that makes floors slippery. If you must use wax, use non-skid floor wax. Do not have throw rugs and other things on the floor that can make you trip. What can I do with my stairs? Do not leave any items on the stairs. Make sure that there are handrails on both sides of the stairs and use them. Fix handrails that are broken or loose. Make sure that handrails are as long as the stairways. Check any carpeting to make sure that it is firmly attached to the stairs. Fix any carpet that is loose or worn. Avoid having throw rugs at the top or bottom of the stairs. If you do have throw rugs, attach them to the floor with carpet tape. Make sure that you have a light switch at the top of the stairs and the bottom of the stairs. If you do not have them, ask someone to add them for you. What else can I do to help prevent falls? Wear shoes that: Do not have high heels. Have rubber bottoms. Are comfortable and fit you well. Are closed at the toe. Do not wear sandals. If you use a stepladder: Make sure that it is fully opened. Do not climb a closed stepladder. Make sure that both sides of  the stepladder are locked into place. Ask someone to hold it for you, if possible. Clearly mark and make sure that you can see: Any grab bars or handrails. First and last steps. Where the edge of each step is. Use tools that  help you move around (mobility aids) if they are needed. These include: Canes. Walkers. Scooters. Crutches. Turn on the lights when you go into a dark area. Replace any light bulbs as soon as they burn out. Set up your furniture so you have a clear path. Avoid moving your furniture around. If any of your floors are uneven, fix them. If there are any pets around you, be aware of where they are. Review your medicines with your doctor. Some medicines can make you feel dizzy. This can increase your chance of falling. Ask your doctor what other things that you can do to help prevent falls. This information is not intended to replace advice given to you by your health care provider. Make sure you discuss any questions you have with your health care provider. Document Released: 05/23/2009 Document Revised: 01/02/2016 Document Reviewed: 08/31/2014 Elsevier Interactive Patient Education  2017 [employer].

## 2022-08-12 NOTE — Progress Notes (Signed)
Subjective:   Eric Amos. is a 89 y.o. male who presents for Medicare Annual/Subsequent preventive examination.  Review of Systems    Cardiac Risk Factors include: advanced age (>40mn, >>45women);diabetes mellitus;hypertension;dyslipidemia;male gender;sedentary lifestyle     Objective:    Today's Vitals   08/12/22 1526  BP: 120/68  Pulse: 75  SpO2: 98%  Weight: 162 lb 6.4 oz (73.7 kg)  Height: '5\' 5"'$  (1.651 m)   Body mass index is 27.02 kg/m.     08/12/2022    3:36 PM 07/24/2021   10:05 AM 04/28/2021   12:32 PM 02/15/2020    9:03 PM 03/08/2019    4:05 PM 01/25/2019    8:56 AM 06/07/2018    9:35 AM  Advanced Directives  Does Patient Have a Medical Advance Directive? Yes Yes Yes No No No No  Type of AParamedicof A[place]Living will [place]adiseLiving will [place] will      Does patient want to make changes to medical advance directive? No - Patient declined        Copy of [place] Chart? Yes - validated most recent copy scanned in chart (See row information) No - copy requested       Would patient like information on creating a medical advance directive?    No - Patient declined No - Patient declined No - Patient declined No - Patient declined    Current Medications (verified) Outpatient Encounter Medications as of 08/12/2022  Medication Sig   acetaminophen (TYLENOL) 325 MG tablet Take 2 tablets (650 mg total) by mouth every 6 (six) hours as needed for mild pain (or temp >/= 101 F).   amLODipine (NORVASC) 10 MG tablet Take 1 tablet by mouth once daily   apixaban (ELIQUIS) 2.5 MG TABS tablet Take 1 tablet (2.5 mg total) by mouth 2 (two) times daily.   atenolol (TENORMIN) 25 MG tablet Take 1 tablet by mouth once daily   ferrous sulfate 325 (65 FE) MG EC tablet Take 325 mg by mouth at bedtime.   finasteride (PROSCAR) 5 MG tablet TAKE 1 TABLET BY MOUTH ONCE DAILY   furosemide  (LASIX) 40 MG tablet TAKE 1 TABLET BY MOUTH ONCE DAILY AS NEEDED FOR  LEG  SWELLING (Patient taking differently: Take 40 mg daily. May take an additional 20 mg on the days of swelling or shortness of breath.)   hydrALAZINE (APRESOLINE) 25 MG tablet TAKE 3 TABLETS BY MOUTH THREE TIMES DAILY   Multiple Vitamin (DAILY MULTIVITAMIN PO) Take 1 tablet by mouth daily.   ONETOUCH ULTRA test strip USE 1 STRIP TO CHECK GLUCOSE ONCE DAILY   pentoxifylline (TRENTAL) 400 MG CR tablet TAKE 1 TABLET BY MOUTH EVERY 12 HOURS . APPOINTMENT REQUIRED FOR FUTURE REFILLS   rosuvastatin (CRESTOR) 40 MG tablet Take 1 tablet by mouth once daily   tamsulosin (FLOMAX) 0.4 MG CAPS capsule Take 1 capsule by mouth once daily   No facility-administered encounter medications on file as of 08/12/2022.    Allergies (verified) Isosorbide mononitrate, Ramipril, Quinidine gluconate, and Quinidine gluconate   History: Past Medical History:  Diagnosis Date   Atrial flutter (HCC)    s/p RFCA   CAD (coronary artery disease)    cath 2003, occluded S-RCA, occluded S-Dx, L-LAD ok, s/p PTCA to LAD   Cataract    CKD (chronic kidney disease) stage 3, GFR 30-59 ml/min (HCC)    Diabetes mellitus    diet  controlled   Hearing aid worn    bilateral   History of kidney stones    Long term (current) use of anticoagulants    Neuromuscular disorder (HCC)    OSA (obstructive sleep apnea) 12/11   very mild, AHI 7/hr   Persistent atrial fibrillation ([place]) 09/26/2015   Pure hyperglyceridemia    PVD (peripheral vascular disease) ([place])    angioplasty of his right lower extremity in [place] by Dr.[name] 2013   Unspecified essential hypertension    Wears dentures    full upper and lower   Past Surgical History:  Procedure Laterality Date   ABDOMINAL AORTOGRAM W/LOWER EXTREMITY N/A 11/15/2017   Procedure: ABDOMINAL AORTOGRAM W/LOWER EXTREMITY;  Surgeon: [name], MD;  Location: [place] [facility];  Service: Cardiovascular;   Laterality: N/A;   Adenosine Myoview  3/06   EF 56%, neg. Ischemia   Adenosine Myoview  02/18/07   nml   AMPUTATION Left 12/15/2017   Procedure: AMPUTATION BELOW KNEE;  Surgeon: Algernon [name], MD;  Location: [facility];  Service: Vascular;  Laterality: Left;   ANGIOPLASTY  1/99   CAD- diogonal with rotational artherectomy   Arthrectomy     of LAD & PTCA   BLEPHAROPLASTY Bilateral    CARDIAC CATHETERIZATION  1/00   CARDIOVERSION  1/04   CARDIOVERSION  5/07   hospital- a flutter   CARPAL TUNNEL RELEASE     ? bilateral   CATARACT EXTRACTION W/PHACO Right 07/28/2017   Procedure: CATARACT EXTRACTION PHACO AND INTRAOCULAR LENS PLACEMENT ([place]) RIGHT DIABETIC;  Surgeon: [name], MD;  Location: [place];  Service: Ophthalmology;  Laterality: Right;  Diabetic - diet controlled   CATARACT EXTRACTION W/PHACO Left 08/18/2017   Procedure: CATARACT EXTRACTION PHACO AND INTRAOCULAR LENS PLACEMENT ([facility]);  Surgeon: [name], MD;  Location: [place];  Service: Ophthalmology;  Laterality: Left;  DIABETES - oral meds   COLONOSCOPY W/ BIOPSIES  10/01/06   sigmoid polyp bx neg, 3 years   COLONOSCOPY WITH PROPOFOL N/A 04/28/2021   Procedure: COLONOSCOPY WITH PROPOFOL;  Surgeon: Gatha Mayer, MD;  Location: [facility]COPY;  Service: Endoscopy;  Laterality: N/A;   CORONARY ANGIOPLASTY  4/03   cutting balloon PTCA pLAD into Diag   CORONARY ARTERY BYPASS GRAFT  2000   LIMA-LAD, SVG-RCA, SVG-Diag; SVG-Diag & SVG-RCA occluded 2003   FRACTURE SURGERY     HAND SURGERY  08/27/09   R thumb procedure wit Scaphoid Gragt and screws, Dr [name]   LOWER EXTREMITY ANGIOGRAPHY Right 01/25/2019   Procedure: LOWER EXTREMITY ANGIOGRAPHY;  Surgeon: Katha Cabal, MD;  Location: [place] [facility];  Service: Cardiovascular;  Laterality: Right;   NM MYOVIEW LTD  4/11   normal   PERIPHERAL VASCULAR CATHETERIZATION Right 06/27/2015   Procedure: Lower Extremity Angiography;  Surgeon:  Algernon [name], MD;  Location: [place] [facility];  Service: Cardiovascular;  Laterality: Right;   PERIPHERAL VASCULAR CATHETERIZATION  06/27/2015   Procedure: Lower Extremity Intervention;  Surgeon: Algernon [name], MD;  Location: [place] [facility];  Service: Cardiovascular;;   POLYPECTOMY  04/28/2021   Procedure: POLYPECTOMY;  Surgeon: Gatha Mayer, MD;  [facility];  Service: Endoscopy;;   Family History  Problem Relation Age of Onset   Stroke Father    Aneurysm Father    Hypertension Father    Prostate cancer Brother    Hypertension Mother    Lung cancer Brother        smoker  Thrombosis Brother    Other Brother        RF valve disorder (smoker)   Lung cancer Brother        smoker   Breast cancer Sister    Liver cancer Brother    Other Sister        cerebral hemorrhage   Social History   Socioeconomic History   Marital status: Married    Spouse name: [name]   Number of children: 5   Years of education: Not on file   Highest education level: Not on file  Occupational History   Occupation: [employer]    Employer: RETIRED  Tobacco Use   Smoking status: Never   Smokeless tobacco: Former    Quit date: 1994  Scientific laboratory technician Use: Never used  Substance and Sexual Activity   Alcohol use: No   Drug use: No   Sexual activity: Never  Other Topics Concern   Not on file  Social History Narrative   Retired now does some Restaurant manager, 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 Married x 73 years in 2022.   5 kids   11 grandchildren.   20 great grandchilren.   Social Determinants of Health   Financial Resource Strain: Low Risk  (08/12/2022)   Overall Financial Resource Strain (CARDIA)    Difficulty of Paying Living Expenses: Not hard at all  Food Insecurity: No Food Insecurity (08/12/2022)   Hunger Vital Sign    Worried About Running Out of Food in the Last Year: Never true    Ran Out of Food in the Last Year: Never true  Transportation Needs: No Transportation  Needs (08/12/2022)   PRAPARE - Hydrologist (Medical): No    Lack of Transportation (Non-Medical): No  Physical Activity: Insufficiently Active (08/12/2022)   Exercise Vital Sign    Days of Exercise per Week: 5 days    Minutes of Exercise per Session: 20 min  Stress: No Stress Concern Present (08/12/2022)   [place]    Feeling of Stress : Not at all  Social Connections: [place] (08/12/2022)   Social Connection and Isolation Panel [NHANES]    Frequency of Communication with Friends and Family: More than three times a week    Frequency of Social Gatherings with Friends and Family: More than three times a week    Attends Religious Services: More than 4 times per year    Active Member of [employer] or Organizations: Yes    Attends Music therapist: More than 4 times per year    Marital Status: Married    Tobacco Counseling Counseling given: Not Answered   Clinical Intake:  Pre-visit preparation completed: Yes  Pain : No/denies pain     BMI - recorded: 27.02 Nutritional Status: BMI 25 -29 Overweight Nutritional Risks: None Diabetes: Yes  How often do you need to have someone help you when you read instructions, pamphlets, or other written materials from your doctor or pharmacy?: 1 - Never  Diabetic?Nutrition Risk Assessment:  Has the patient had any N/V/D within the last 2 months?  No  Does the patient have any non-healing wounds?  No  Has the patient had any unintentional weight loss or weight gain?  No   Diabetes:  Is the patient diabetic?  Yes  If diabetic, was a CBG obtained today?  No  Did the patient bring in their glucometer from home?  No  How often do you monitor your CBG's? DAILY.   Financial Strains and Diabetes Management:  Are you having any financial strains with the device, your supplies or your medication? No .  Does the patient want to be seen  by Chronic Care Management for management of their diabetes?  No  Would the patient like to be referred to a Nutritionist or for Diabetic Management?  No   Diabetic Exams:  Diabetic Eye Exam: Completed 10/30/2021. Pt has been advised about the importance in completing this exam.  Diabetic Foot Exam: Completed 04/24/2023. Pt has been advised about the importance in completing this exam.   Interpreter Needed?: No  Information entered by :: mj Gaberiel Youngblood, lpn   Activities of Daily Living    08/12/2022    3:36 PM  In your present state of health, do you have any difficulty performing the following activities:  Hearing? 1  Vision? 0  Difficulty concentrating or making decisions? 0  Walking or climbing stairs? 0  Dressing or bathing? 0  Doing errands, shopping? 0  Preparing Food and eating ? N  Using the Toilet? N  In the past six months, have you accidently leaked urine? N  Do you have problems with loss of bowel control? N  Managing your Medications? N  Managing your Finances? N  Housekeeping or managing your Housekeeping? N    Patient Care Team: [name], MD as PCP - General (Family Medicine) Fay Records, MD as PCP - Cardiology (Cardiology) Edythe Clarity, [facility] as Pharmacist (Pharmacist)  Indicate any recent Medical Services you may have received from other than [facility] providers in the past year (date may be approximate).     Assessment:   This is a routine wellness examination for Shamir.  Hearing/Vision screen Hearing Screening - Comments:: Wears hearing aids. Vision Screening - Comments:: Readers. [employer] [place].  Dietary issues and exercise activities discussed: Current Exercise Habits: Home exercise routine, Type of exercise: walking, Time (Minutes): 20, Frequency (Times/Week): 5, Weekly Exercise (Minutes/Week): 100, Exercise limited by: cardiac condition(s);orthopedic condition(s)   Goals Addressed             This Visit's Progress    Exercise 3x per  week (30 min per time)   On track    Try to increase exercise as tolerated.        Depression Screen    08/12/2022    3:31 PM 07/24/2021    9:58 AM 05/20/2021   12:13 PM 05/08/2021   11:18 AM 12/31/2020    8:13 AM 07/24/2020    9:26 AM 10/24/2018   11:37 AM  PHQ 2/9 Scores  PHQ - 2 Score 0 0 0 0 0 0 0  PHQ- 9 Score       0    Fall Risk    08/12/2022    3:36 PM 07/24/2021   10:05 AM 05/20/2021   12:13 PM 05/08/2021   11:18 AM 12/31/2020    8:13 AM  [place] in the past year? 1 0 0 0 1  Number falls in past yr: 1 0 0 0 0  Injury with Fall? 1 0 0 0 0  Risk for fall due to : History of fall(s);Impaired balance/gait;Orthopedic patient Impaired balance/gait No Fall Risks No Fall Risks Impaired balance/gait;Impaired mobility;History of fall(s)  Follow up Education provided;Falls prevention discussed Falls prevention discussed Falls evaluation completed Falls evaluation completed Falls evaluation completed    FALL RISK PREVENTION PERTAINING TO THE HOME:  Any  stairs in or around the home? Yes  If so, are there any without handrails? No  Home free of loose throw rugs in walkways, pet beds, electrical cords, etc? Yes  Adequate lighting in your home to reduce risk of falls? Yes   ASSISTIVE DEVICES UTILIZED TO PREVENT FALLS:  Life alert? No  Use of a cane, walker or w/c? Yes  Grab bars in the bathroom? Yes  Shower chair or bench in shower? Yes  Elevated toilet seat or a handicapped toilet? Yes   TIMED UP AND GO:  Was the test performed? Yes .  Length of time to ambulate 10 feet: 15 sec.   Gait slow and steady with assistive device  Cognitive Function:        08/12/2022    3:37 PM 07/24/2021   10:10 AM  6CIT Screen  What Year? 0 points 0 points  What month? 0 points 0 points  What time? 0 points 0 points  Count back from 20 0 points 0 points  Months in reverse 4 points 4 points  Repeat phrase 6 points 2 points  Total Score 10 points 6 points     Immunizations Immunization History  Administered Date(s) Administered   Fluad Quad(high Dose 65+) 05/03/2019, 05/20/2021, 05/18/2022   Influenza Split 07/14/2011, 06/02/2012   Influenza Whole 05/24/2005, 05/20/2007, 05/21/2008, 07/19/2009, 05/28/2010   Influenza, High Dose Seasonal PF 05/03/2017   Influenza,inj,Quad PF,6+ Mos 04/11/2013, 04/20/2014, 05/06/2015, 06/02/2016, 05/13/2018   Influenza-Unspecified 05/03/2017   PFIZER(Purple Top)SARS-COV-2 Vaccination 10/05/2019, 10/20/2019   Pneumococcal Conjugate-13 05/06/2015   Pneumococcal Polysaccharide-23 10/17/2013   Td 03/13/2004   Tdap 04/14/2019    TDAP status: Up to date  Flu Vaccine status: Up to date  Pneumococcal vaccine status: Up to date  Covid-19 vaccine status: Completed vaccines  Qualifies for Shingles Vaccine? Yes   Zostavax completed No   Shingrix Completed?: No.    Education has been provided regarding the importance of this vaccine. Patient has been advised to call insurance company to determine out of pocket expense if they have not yet received this vaccine. Advised may also receive vaccine at local pharmacy or Health Dept. Verbalized acceptance and understanding.  Screening Tests Health Maintenance  Topic Date Due   COVID-19 Vaccine (3 - Pfizer risk series) 10/09/2022 (Originally 11/17/2019)   HEMOGLOBIN A1C  09/16/2022   OPHTHALMOLOGY EXAM  10/31/2022   FOOT EXAM  04/24/2023   Medicare Annual Wellness (AWV)  08/13/2023   DTaP/Tdap/Td (3 - Td or Tdap) 04/13/2029   Pneumonia Vaccine 19+ Years old  Completed   INFLUENZA VACCINE  Completed   HPV VACCINES  Aged Out   Zoster Vaccines- Shingrix  Discontinued    Health Maintenance  There are no preventive care reminders to display for this patient.   Colorectal cancer screening: No longer required.   Lung Cancer Screening: (Low Dose CT Chest recommended if Age 42-80 years, 30 pack-year currently smoking OR have quit w/in 15years.) does not qualify.    Lung Cancer Screening Referral: N/A  Additional Screening:  Hepatitis C Screening: does not qualify; Completed N/A  Vision Screening: Recommended annual ophthalmology exams for early detection of glaucoma and other disorders of the eye. Is the patient up to date with their annual eye exam?  Yes  Who is the provider or what is the name of the office in which the patient attends annual eye exams? [place] If pt is not established with a provider, would they like to be referred to a provider to  establish care? No .   Dental Screening: Recommended annual dental exams for proper oral hygiene  Community Resource Referral / Chronic Care Management: CRR required this visit?  No   CCM required this visit?  No      Plan:     I have personally reviewed and noted the following in the patient's chart:   Medical and social history Use of alcohol, tobacco or illicit drugs  Current medications and supplements including opioid prescriptions. Patient is not currently taking opioid prescriptions. Functional ability and status Nutritional status Physical activity Advanced directives List of other physicians Hospitalizations, surgeries, and ER visits in previous 12 months Vitals Screenings to include cognitive, depression, and falls Referrals and appointments  In addition, I have reviewed and discussed with patient certain preventive protocols, quality metrics, and best practice recommendations. A written personalized care plan for preventive services as well as general preventive health recommendations were provided to patient.     Chriss Driver, LPN   12/13/9792   Nurse Notes: Pt is up to date on age appropriate health maintenance.

## 2022-08-14 ENCOUNTER — Telehealth: Payer: Self-pay

## 2022-08-14 NOTE — Telephone Encounter (Signed)
Spoke with pt. Pt was notified of lab results and recommendations. Pt will continue his current medication and follow up as planned.

## 2022-08-16 ENCOUNTER — Other Ambulatory Visit: Payer: Self-pay | Admitting: Family Medicine

## 2022-08-28 ENCOUNTER — Encounter: Payer: Self-pay | Admitting: Podiatry

## 2022-08-28 DIAGNOSIS — R3912 Poor urinary stream: Secondary | ICD-10-CM | POA: Diagnosis not present

## 2022-08-28 DIAGNOSIS — N3021 Other chronic cystitis with hematuria: Secondary | ICD-10-CM | POA: Diagnosis not present

## 2022-08-28 DIAGNOSIS — N401 Enlarged prostate with lower urinary tract symptoms: Secondary | ICD-10-CM | POA: Diagnosis not present

## 2022-08-28 DIAGNOSIS — R351 Nocturia: Secondary | ICD-10-CM | POA: Diagnosis not present

## 2022-08-28 DIAGNOSIS — R3914 Feeling of incomplete bladder emptying: Secondary | ICD-10-CM | POA: Diagnosis not present

## 2022-08-31 ENCOUNTER — Encounter: Payer: Self-pay | Admitting: Podiatry

## 2022-08-31 ENCOUNTER — Ambulatory Visit (INDEPENDENT_AMBULATORY_CARE_PROVIDER_SITE_OTHER): Payer: PPO | Admitting: Podiatry

## 2022-08-31 VITALS — BP 130/65 | HR 80

## 2022-08-31 DIAGNOSIS — L989 Disorder of the skin and subcutaneous tissue, unspecified: Secondary | ICD-10-CM

## 2022-08-31 NOTE — Progress Notes (Signed)
Chief Complaint  Patient presents with   Pain    Patient is here for left lower extremity pain.patient states that he has not been able to put pressure on it with prothesis.     HPI: 89 y.o. male presenting today presenting for evaluation of a symptomatic callus to the plantar aspect of the right foot.  Patient has had this callus debrided in the past with good success and long-term alleviation.  Slowly calluses returned and he presents today for further treatment and evaluation  Patient also has history of BKA left lower extremity.  He states that he is having some pain and tenderness associated to the amputation stump of the left lower extremity.  Recommend that he follows up with his original surgeon  Past Medical History:  Diagnosis Date   Atrial flutter ([place])    s/p RFCA   CAD (coronary artery disease)    cath 2003, occluded S-RCA, occluded S-Dx, L-LAD ok, s/p PTCA to LAD   Cataract    CKD (chronic kidney disease) stage 3, GFR 30-59 ml/min (HCC)    Diabetes mellitus    diet controlled   Hearing aid worn    bilateral   History of kidney stones    Long term (current) use of anticoagulants    Neuromuscular disorder (HCC)    OSA (obstructive sleep apnea) 12/11   very mild, AHI 7/hr   Persistent atrial fibrillation ([place]) 09/26/2015   Pure hyperglyceridemia    PVD (peripheral vascular disease) ([place])    angioplasty of his right lower extremity in [place] by Dr.[name] 2013   Unspecified essential hypertension    Wears dentures    full upper and lower    Past Surgical History:  Procedure Laterality Date   ABDOMINAL AORTOGRAM W/LOWER EXTREMITY N/A 11/15/2017   Procedure: ABDOMINAL AORTOGRAM W/LOWER EXTREMITY;  Surgeon: [name], MD;  Location: [place] [facility];  Service: Cardiovascular;  Laterality: N/A;   Adenosine Myoview  3/06   EF 56%, neg. Ischemia   Adenosine Myoview  02/18/07   nml   AMPUTATION Left 12/15/2017   Procedure: AMPUTATION BELOW KNEE;  Surgeon: Algernon [name], MD;  Location: [facility] ORS;  Service: Vascular;  Laterality: Left;   ANGIOPLASTY  1/99   CAD- diogonal with rotational artherectomy   Arthrectomy     of LAD & PTCA   BLEPHAROPLASTY Bilateral    CARDIAC CATHETERIZATION  1/00   CARDIOVERSION  1/04   CARDIOVERSION  5/07   hospital- a flutter   CARPAL TUNNEL RELEASE     ? bilateral   CATARACT EXTRACTION W/PHACO Right 07/28/2017   Procedure: CATARACT EXTRACTION PHACO AND INTRAOCULAR LENS PLACEMENT ([place]) RIGHT DIABETIC;  Surgeon: [name], MD;  Location: [place];  Service: Ophthalmology;  Laterality: Right;  Diabetic - diet controlled   CATARACT EXTRACTION W/PHACO Left 08/18/2017   Procedure: CATARACT EXTRACTION PHACO AND INTRAOCULAR LENS PLACEMENT ([facility]);  Surgeon: [name], MD;  Location: [place];  Service: Ophthalmology;  Laterality: Left;  DIABETES - oral meds   COLONOSCOPY W/ BIOPSIES  10/01/06   sigmoid polyp bx neg, 3 years   COLONOSCOPY WITH PROPOFOL N/A 04/28/2021   Procedure: COLONOSCOPY WITH PROPOFOL;  Surgeon: Gatha Mayer, MD;  Location: [facility] ENDOSCOPY;  Service: Endoscopy;  Laterality: N/A;   CORONARY ANGIOPLASTY  4/03   cutting balloon PTCA pLAD into Diag   CORONARY ARTERY BYPASS GRAFT  2000   LIMA-LAD, SVG-RCA, SVG-Diag; SVG-Diag & SVG-RCA occluded 2003   FRACTURE SURGERY  HAND SURGERY  08/27/09   R thumb procedure wit Scaphoid Gragt and screws, Dr [name]   LOWER EXTREMITY ANGIOGRAPHY Right 01/25/2019   Procedure: LOWER EXTREMITY ANGIOGRAPHY;  Surgeon: Katha Cabal, MD;  Location: [place] [facility];  Service: Cardiovascular;  Laterality: Right;   NM MYOVIEW LTD  4/11   normal   PERIPHERAL VASCULAR CATHETERIZATION Right 06/27/2015   Procedure: Lower Extremity Angiography;  Surgeon: Algernon [name], MD;  Location: [place] [facility];  Service: Cardiovascular;  Laterality: Right;   PERIPHERAL VASCULAR CATHETERIZATION  06/27/2015   Procedure: Lower Extremity  Intervention;  Surgeon: Algernon [name], MD;  Location: [place] [facility];  Service: Cardiovascular;;   POLYPECTOMY  04/28/2021   Procedure: POLYPECTOMY;  Surgeon: Gatha Mayer, MD;  Location: [facility] ENDOSCOPY;  Service: Endoscopy;;    Allergies  Allergen Reactions   Isosorbide Mononitrate Other (See Comments)    Unknown, doesn't remember    Ramipril Cough and Other (See Comments)   Quinidine Gluconate Rash   Quinidine Gluconate Other (See Comments) and Rash     Physical Exam: General: The patient is alert and oriented x3 in no acute distress.  Dermatology: Skin is warm, dry and supple bilateral lower extremities. Negative for open lesions or macerations.  There is a hyperkeratotic callus noted to the plantar aspect of the fifth MTP right foot  Vascular: Palpable pedal pulses bilaterally. Capillary refill within normal limits.  Negative for any significant edema or erythema  Neurological: Light touch and protective threshold grossly intact  Musculoskeletal Exam: History of BKA LLE   Assessment: 1.  Symptomatic callus plantar aspect of the right fifth MTP -Excisional debridement of the hyperkeratotic callus was performed today as a courtesy to the patient.  He did feel significant relief -Continue wearing good supportive shoes and sneakers -Return to clinic as needed      Edrick Kins, DPM [facility]  Dr. Edrick Kins, DPM    [address], [place] [address]                Office [phone]  Fax [phone]

## 2022-09-08 ENCOUNTER — Telehealth (INDEPENDENT_AMBULATORY_CARE_PROVIDER_SITE_OTHER): Payer: Self-pay | Admitting: Nurse Practitioner

## 2022-09-08 NOTE — Telephone Encounter (Signed)
Patient's son called and wanted to bring his father in for a sore on his stomp.  [name] preformed the surgery. I scheduled him for Monday at 10am. Is that ok? Please advise

## 2022-09-08 NOTE — Telephone Encounter (Signed)
That's fine

## 2022-09-12 ENCOUNTER — Other Ambulatory Visit: Payer: Self-pay | Admitting: Family Medicine

## 2022-09-12 DIAGNOSIS — I10 Essential (primary) hypertension: Secondary | ICD-10-CM

## 2022-09-14 ENCOUNTER — Encounter (INDEPENDENT_AMBULATORY_CARE_PROVIDER_SITE_OTHER): Payer: Self-pay | Admitting: Nurse Practitioner

## 2022-09-14 ENCOUNTER — Ambulatory Visit (INDEPENDENT_AMBULATORY_CARE_PROVIDER_SITE_OTHER): Payer: PPO | Admitting: Nurse Practitioner

## 2022-09-14 ENCOUNTER — Ambulatory Visit
Admission: RE | Admit: 2022-09-14 | Discharge: 2022-09-14 | Disposition: A | Discharge status: Home / Self Care | Payer: PPO | Source: Ambulatory Visit | Attending: Nurse Practitioner | Admitting: Nurse Practitioner

## 2022-09-14 VITALS — BP 142/66 | HR 55 | Ht 65.0 in | Wt 162.0 lb

## 2022-09-14 DIAGNOSIS — Z89512 Acquired absence of left leg below knee: Secondary | ICD-10-CM

## 2022-09-14 DIAGNOSIS — M1712 Unilateral primary osteoarthritis, left knee: Secondary | ICD-10-CM | POA: Diagnosis not present

## 2022-09-14 DIAGNOSIS — R600 Localized edema: Secondary | ICD-10-CM | POA: Diagnosis not present

## 2022-09-14 DIAGNOSIS — M25462 Effusion, left knee: Secondary | ICD-10-CM | POA: Diagnosis not present

## 2022-09-27 NOTE — Progress Notes (Unsigned)
Cardiology Office Note   Date:  09/28/2022   ID:  Eric Hough., DOB 1928-08-06, MRN [id number]  PCP:  [name], MD  Cardiologist:   Dorris Carnes, MD   Pt presents fro f/u of CAD      History of Present Illness: Eric Pullian. is a 89 y.o. male with a history of CAD   He is s/p CABG in 2000 (LIMA to LAD; SVG to Diag; SVG to PDA)   In 2003 he underwent PTCA of LAD.  Pt also has a Hx of HTN, permanent atrial fib; CKD Stage III, DM, PAD (followed at [facility]; s/p L BKA )  I saw the pt in 2022   Hew as seen by E Monge in the interval   He denies CP   Breathing is OK   No dizziness    He usually takes lasix 3x per week   last week took it every day   Am sausage, egg, toast OJ Lunch  [place] stew   or veggies   Dinner:   Snack     Current Meds  Medication Sig   acetaminophen (TYLENOL) 325 MG tablet Take 2 tablets (650 mg total) by mouth every 6 (six) hours as needed for mild pain (or temp >/= 101 F).   amLODipine (NORVASC) 10 MG tablet Take 1 tablet by mouth once daily   apixaban (ELIQUIS) 2.5 MG TABS tablet Take 1 tablet (2.5 mg total) by mouth 2 (two) times daily.   atenolol (TENORMIN) 25 MG tablet Take 1 tablet by mouth once daily   ferrous sulfate 325 (65 FE) MG EC tablet Take 325 mg by mouth at bedtime.   finasteride (PROSCAR) 5 MG tablet TAKE 1 TABLET BY MOUTH ONCE DAILY   furosemide (LASIX) 40 MG tablet TAKE 1 TABLET BY MOUTH ONCE DAILY AS NEEDED FOR  LEG  SWELLING   Multiple Vitamin (DAILY MULTIVITAMIN PO) Take 1 tablet by mouth daily.   ONETOUCH ULTRA test strip USE 1 STRIP TO CHECK GLUCOSE ONCE DAILY   pentoxifylline (TRENTAL) 400 MG CR tablet TAKE 1 TABLET BY MOUTH EVERY 12 HOURS . APPOINTMENT REQUIRED FOR FUTURE REFILLS   rosuvastatin (CRESTOR) 40 MG tablet Take 1 tablet by mouth once daily   tamsulosin (FLOMAX) 0.4 MG CAPS capsule Take 1 capsule by mouth once daily   [DISCONTINUED] hydrALAZINE (APRESOLINE) 25 MG tablet TAKE 3 TABLETS BY MOUTH THREE TIMES  DAILY     Allergies:   Isosorbide mononitrate, Ramipril, Quinidine gluconate, and Quinidine gluconate   Past Medical History:  Diagnosis Date   Atrial flutter (HCC)    s/p RFCA   CAD (coronary artery disease)    cath 2003, occluded S-RCA, occluded S-Dx, L-LAD ok, s/p PTCA to LAD   Cataract    CKD (chronic kidney disease) stage 3, GFR 30-59 ml/min (HCC)    Diabetes mellitus    diet controlled   Hearing aid worn    bilateral   History of kidney stones    Long term (current) use of anticoagulants    Neuromuscular disorder (HCC)    OSA (obstructive sleep apnea) 12/11   very mild, AHI 7/hr   Persistent atrial fibrillation ([place]) 09/26/2015   Pure hyperglyceridemia    PVD (peripheral vascular disease) ([place])    angioplasty of his right lower extremity in [place] by Dr.[name] 2013   Unspecified essential hypertension    Wears dentures    full upper and lower    Past Surgical History:  Procedure Laterality Date   ABDOMINAL AORTOGRAM W/LOWER EXTREMITY N/A 11/15/2017   Procedure: ABDOMINAL AORTOGRAM W/LOWER EXTREMITY;  Surgeon: [name], MD;  Location: [place] [facility];  Service: Cardiovascular;  Laterality: N/A;   Adenosine Myoview  3/06   EF 56%, neg. Ischemia   Adenosine Myoview  02/18/07   nml   AMPUTATION Left 12/15/2017   Procedure: AMPUTATION BELOW KNEE;  Surgeon: Algernon [name], MD;  Location: [facility] ORS;  Service: Vascular;  Laterality: Left;   ANGIOPLASTY  1/99   CAD- diogonal with rotational artherectomy   Arthrectomy     of LAD & PTCA   BLEPHAROPLASTY Bilateral    CARDIAC CATHETERIZATION  1/00   CARDIOVERSION  1/04   CARDIOVERSION  5/07   hospital- a flutter   CARPAL TUNNEL RELEASE     ? bilateral   CATARACT EXTRACTION W/PHACO Right 07/28/2017   Procedure: CATARACT EXTRACTION PHACO AND INTRAOCULAR LENS PLACEMENT ([place]) RIGHT DIABETIC;  Surgeon: [name], MD;  Location: [place];  Service: Ophthalmology;  Laterality: Right;  Diabetic - diet  controlled   CATARACT EXTRACTION W/PHACO Left 08/18/2017   Procedure: CATARACT EXTRACTION PHACO AND INTRAOCULAR LENS PLACEMENT (IOC);  Surgeon: [name], MD;  Location: [place];  Service: Ophthalmology;  Laterality: Left;  DIABETES - oral meds   COLONOSCOPY W/ BIOPSIES  10/01/06   sigmoid polyp bx neg, 3 years   COLONOSCOPY WITH PROPOFOL N/A 04/28/2021   Procedure: COLONOSCOPY WITH PROPOFOL;  Surgeon: Gatha Mayer, MD;  Location: [facility];  Service: Endoscopy;  Laterality: N/A;   CORONARY ANGIOPLASTY  4/03   cutting balloon PTCA pLAD into Diag   CORONARY ARTERY BYPASS GRAFT  2000   LIMA-LAD, SVG-RCA, SVG-Diag; SVG-Diag & SVG-RCA occluded 2003   FRACTURE SURGERY     HAND SURGERY  08/27/09   R thumb procedure wit Scaphoid Gragt and screws, Dr [name]   LOWER EXTREMITY ANGIOGRAPHY Right 01/25/2019   Procedure: LOWER EXTREMITY ANGIOGRAPHY;  Surgeon: Katha Cabal, MD;  Location: [place] [facility];  Service: Cardiovascular;  Laterality: Right;   NM MYOVIEW LTD  4/11   normal   PERIPHERAL VASCULAR CATHETERIZATION Right 06/27/2015   Procedure: Lower Extremity Angiography;  Surgeon: Algernon [name], MD;  Location: [place] [facility];  Service: Cardiovascular;  Laterality: Right;   PERIPHERAL VASCULAR CATHETERIZATION  06/27/2015   Procedure: Lower Extremity Intervention;  Surgeon: Algernon [name], MD;  Location: [place] [facility];  Service: Cardiovascular;;   POLYPECTOMY  04/28/2021   Procedure: POLYPECTOMY;  Surgeon: Gatha Mayer, MD;  Location: [facility] ENDOSCOPY;  Service: Endoscopy;;     Social History:  The patient  reports that he has never smoked. He quit smokeless tobacco use about 30 years ago. He reports that he does not drink alcohol and does not use drugs.   Family History:  The patient's family history includes Aneurysm in his father; Breast cancer in his sister; Hypertension in his father and mother; Liver cancer in his brother; Lung cancer in his brother and  brother; Other in his brother and sister; Prostate cancer in his brother; Stroke in his father; Thrombosis in his brother.    ROS:  Please see the history of present illness. All other systems are reviewed and  Negative to the above problem except as noted.    PHYSICAL EXAM: VS:  BP (!) 120/54   Pulse 66   Ht 5' 5"$  (1.651 m)   Wt 166 lb 9.6 oz (75.6 kg)  SpO2 99%   BMI 27.72 kg/m   GEN: Well nourished, well developed, in no acute distress  \ Neck: no JVD, carotid bruit Cardiac: Irreg  no murmurs  1-2 + RLE edema   Respiratory:  clear to auscultation bilaterally, GII: soft, nontender, nondistended, + BS  No hepatomegaly  MS:  S/p L BKA   Skin: warm and dry, no rash Neuro:  Strength and sensation are intact Psych: euthymic mood, full affect   EKG:  EKG is not ordered today.    Lipid Panel    Component Value Date/Time   CHOL 90 03/16/2022 1041   CHOL 83 (L) 12/09/2020 1140   TRIG 168 (H) 03/16/2022 1041   HDL 35 (L) 03/16/2022 1041   HDL 30 (L) 12/09/2020 1140   CHOLHDL 2.6 03/16/2022 1041   VLDL 39 (H) 03/01/2017 0828   LDLCALC 30 03/16/2022 1041   LDLDIRECT 99.1 02/19/2011 0823      Wt Readings from Last 3 Encounters:  09/28/22 166 lb 9.6 oz (75.6 kg)  09/14/22 162 lb (73.5 kg)  08/12/22 162 lb 6.4 oz (73.7 kg)      ASSESSMENT AND PLAN:  1  CAD   remote CABG.  Remote intervention (2003).  Remains sympttom free.  2  Bradycardia heart rate appears okay on current regimen.   3  HTN  BP is well controlled  He deneis dizziness     I would recomm cutting hydralazine to bid  for ease of taking   Follow BP     4 atrial fibrillation.  Permanent  Check CBC     5  PAD   Followed at [facility].  Continue Crestor    Plan for follow-up in the winter.     Current medicines are reviewed at length with the patient today.  The patient does not have concerns regarding medicines.  Signed, Dorris Carnes, MD  09/28/2022 10:02 PM    [facility] [place], [place], [place]  [address] Phone: [phone]; Fax: [phone]

## 2022-09-28 ENCOUNTER — Encounter (INDEPENDENT_AMBULATORY_CARE_PROVIDER_SITE_OTHER): Payer: Self-pay | Admitting: Nurse Practitioner

## 2022-09-28 ENCOUNTER — Encounter: Payer: Self-pay | Admitting: Internal Medicine

## 2022-09-28 ENCOUNTER — Ambulatory Visit: Payer: PPO | Attending: General Practice | Admitting: Internal Medicine

## 2022-09-28 VITALS — BP 120/54 | HR 66 | Ht 65.0 in | Wt 166.6 lb

## 2022-09-28 DIAGNOSIS — I4821 Permanent atrial fibrillation: Secondary | ICD-10-CM | POA: Diagnosis not present

## 2022-09-28 DIAGNOSIS — N183 Chronic kidney disease, stage 3 unspecified: Secondary | ICD-10-CM

## 2022-09-28 DIAGNOSIS — E785 Hyperlipidemia, unspecified: Secondary | ICD-10-CM

## 2022-09-28 DIAGNOSIS — I2510 Atherosclerotic heart disease of native coronary artery without angina pectoris: Secondary | ICD-10-CM

## 2022-09-28 DIAGNOSIS — I10 Essential (primary) hypertension: Secondary | ICD-10-CM

## 2022-09-28 DIAGNOSIS — I739 Peripheral vascular disease, unspecified: Secondary | ICD-10-CM | POA: Diagnosis not present

## 2022-09-28 DIAGNOSIS — Z79899 Other long term (current) drug therapy: Secondary | ICD-10-CM

## 2022-09-28 DIAGNOSIS — I4891 Unspecified atrial fibrillation: Secondary | ICD-10-CM

## 2022-09-28 MED ORDER — HYDRALAZINE HCL 25 MG PO TABS: 75.0000 mg | ORAL_TABLET | Freq: Two times a day (BID) | ORAL | 3 refills | Status: DC

## 2022-09-28 NOTE — Progress Notes (Signed)
Subjective:    Patient ID: Eric [name]., male    DOB: 04/03/1928, 89 y.o.   MRN: [id number] Chief Complaint  Patient presents with   Wound Check    Eric [name] is a 89 year old male who presents today following pain and discomfort and some occasional swelling of his left residual limb.  He recently fell on it several weeks ago and had a subsequent fall a couple of weeks ago.  He has been having some swelling in a certain area.  There is no open wounds or ulcerations but it is very tender to the touch.  There is no significant redness in the area.    Review of Systems  Cardiovascular:  Positive for leg swelling.  All other systems reviewed and are negative.      Objective:   Physical Exam Vitals reviewed.  HENT:     Head: Normocephalic.  Cardiovascular:     Rate and Rhythm: Normal rate.  Pulmonary:     Effort: Pulmonary effort is normal.  Musculoskeletal:     Left Lower Extremity: Left leg is amputated below knee.  Skin:    General: Skin is warm and dry.  Neurological:     Mental Status: He is alert and oriented to person, place, and time.  Psychiatric:        Mood and Affect: Mood normal.        Behavior: Behavior normal.        Thought Content: Thought content normal.        Judgment: Judgment normal.     BP (!) 142/66   Pulse (!) 55   Ht 5' 5"$  (1.651 m)   Wt 162 lb (73.5 kg)   BMI 26.96 kg/m   Past Medical History:  Diagnosis Date   Atrial flutter (HCC)    s/p RFCA   CAD (coronary artery disease)    cath 2003, occluded S-RCA, occluded S-Dx, L-LAD ok, s/p PTCA to LAD   Cataract    CKD (chronic kidney disease) stage 3, GFR 30-59 ml/min (HCC)    Diabetes mellitus    diet controlled   Hearing aid worn    bilateral   History of kidney stones    Long term (current) use of anticoagulants    Neuromuscular disorder (HCC)    OSA (obstructive sleep apnea) 12/11   very mild, AHI 7/hr   Persistent atrial fibrillation ([place]) 09/26/2015   Pure  hyperglyceridemia    PVD (peripheral vascular disease) ([employer])    angioplasty of his right lower extremity in [place] by Dr.[name] 2013   Unspecified essential hypertension    Wears dentures    full upper and lower    Social History   Socioeconomic History   Marital status: Married    Spouse name: Production manager   Number of children: 5   Years of education: Not on file   Highest education level: Not on file  Occupational History   Occupation: [employer]    Employer: RETIRED  Tobacco Use   Smoking status: Never   Smokeless tobacco: Former    Quit date: 1994  Scientific laboratory technician Use: Never used  Substance and Sexual Activity   Alcohol use: No   Drug use: No   Sexual activity: Never  Other Topics Concern   Not on file  Social History Narrative   Retired now does some Restaurant manager, 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 Married x 73 years in 2022.   5 kids   34  grandchildren.   20 great grandchilren.   Social Determinants of Health   Financial Resource Strain: Low Risk  (08/12/2022)   Overall Financial Resource Strain (CARDIA)    Difficulty of Paying Living Expenses: Not hard at all  Food Insecurity: No Food Insecurity (08/12/2022)   Hunger Vital Sign    Worried About Running Out of Food in the Last Year: Never true    Ran Out of Food in the Last Year: Never true  Transportation Needs: No Transportation Needs (08/12/2022)   PRAPARE - Hydrologist (Medical): No    Lack of Transportation (Non-Medical): No  Physical Activity: Insufficiently Active (08/12/2022)   Exercise Vital Sign    Days of Exercise per Week: 5 days    Minutes of Exercise per Session: 20 min  Stress: No Stress Concern Present (08/12/2022)   [place]    Feeling of Stress : Not at all  Social Connections: [place] (08/12/2022)   Social Connection and Isolation Panel [NHANES]    Frequency of Communication with Friends and  Family: More than three times a week    Frequency of Social Gatherings with Friends and Family: More than three times a week    Attends Religious Services: More than 4 times per year    Active Member of Clubs or Organizations: Yes    Attends Archivist Meetings: More than 4 times per year    Marital Status: Married  Human resources officer Violence: Not At Risk (08/12/2022)   Humiliation, Afraid, Rape, and Kick questionnaire    Fear of Current or Ex-Partner: No    Emotionally Abused: No    Physically Abused: No    Sexually Abused: No    Past Surgical History:  Procedure Laterality Date   ABDOMINAL AORTOGRAM W/LOWER EXTREMITY N/A 11/15/2017   Procedure: ABDOMINAL AORTOGRAM W/LOWER EXTREMITY;  Surgeon: [name], MD;  Location: [place] [facility];  Service: Cardiovascular;  Laterality: N/A;   Adenosine Myoview  3/06   EF 56%, neg. Ischemia   Adenosine Myoview  02/18/07   nml   AMPUTATION Left 12/15/2017   Procedure: AMPUTATION BELOW KNEE;  Surgeon: Algernon [name], MD;  Location: [facility] ORS;  Service: Vascular;  Laterality: Left;   ANGIOPLASTY  1/99   CAD- diogonal with rotational artherectomy   Arthrectomy     of LAD & PTCA   BLEPHAROPLASTY Bilateral    CARDIAC CATHETERIZATION  1/00   CARDIOVERSION  1/04   CARDIOVERSION  5/07   hospital- a flutter   CARPAL TUNNEL RELEASE     ? bilateral   CATARACT EXTRACTION W/PHACO Right 07/28/2017   Procedure: CATARACT EXTRACTION PHACO AND INTRAOCULAR LENS PLACEMENT ([place]) RIGHT DIABETIC;  Surgeon: [name], MD;  Location: [place];  Service: Ophthalmology;  Laterality: Right;  Diabetic - diet controlled   CATARACT EXTRACTION W/PHACO Left 08/18/2017   Procedure: CATARACT EXTRACTION PHACO AND INTRAOCULAR LENS PLACEMENT (IOC);  Surgeon: [name], MD;  Location: [place];  Service: Ophthalmology;  Laterality: Left;  DIABETES - oral meds   COLONOSCOPY W/ BIOPSIES  10/01/06   sigmoid polyp bx neg, 3 years    COLONOSCOPY WITH PROPOFOL N/A 04/28/2021   Procedure: COLONOSCOPY WITH PROPOFOL;  Surgeon: Gatha Mayer, MD;  Location[facility];  Service: Endoscopy;  Laterality: N/A;   CORONARY ANGIOPLASTY  4/03   cutting balloon PTCA pLAD into Diag   CORONARY ARTERY BYPASS GRAFT  2000  LIMA-LAD, SVG-RCA, SVG-Diag; SVG-Diag & SVG-RCA occluded 2003   FRACTURE SURGERY     HAND SURGERY  08/27/09   R thumb procedure wit Scaphoid Gragt and screws, Dr [name]   LOWER EXTREMITY ANGIOGRAPHY Right 01/25/2019   Procedure: LOWER EXTREMITY ANGIOGRAPHY;  Surgeon: Katha Cabal, MD;  Location: [place] [facility];  Service: Cardiovascular;  Laterality: Right;   NM MYOVIEW LTD  4/11   normal   PERIPHERAL VASCULAR CATHETERIZATION Right 06/27/2015   Procedure: Lower Extremity Angiography;  Surgeon: Algernon [name], MD;  Location: [place] [facility];  Service: Cardiovascular;  Laterality: Right;   PERIPHERAL VASCULAR CATHETERIZATION  06/27/2015   Procedure: Lower Extremity Intervention;  Surgeon: Algernon [name], MD;  Location: [place] [facility];  Service: Cardiovascular;;   POLYPECTOMY  04/28/2021   Procedure: POLYPECTOMY;  Surgeon: Gatha Mayer, MD;  Location: [facility] ENDOSCOPY;  Service: Endoscopy;;    Family History  Problem Relation Age of Onset   Stroke Father    Aneurysm Father    Hypertension Father    Prostate cancer Brother    Hypertension Mother    Lung cancer Brother        smoker   Thrombosis Brother    Other Brother        RF valve disorder (smoker)   Lung cancer Brother        smoker   Breast cancer Sister    Liver cancer Brother    Other Sister        cerebral hemorrhage    Allergies  Allergen Reactions   Isosorbide Mononitrate Other (See Comments)    Unknown, doesn't remember    Ramipril Cough and Other (See Comments)   Quinidine Gluconate Rash   Quinidine Gluconate Other (See Comments) and Rash       Latest Ref Rng & Units 03/16/2022   10:41 AM 05/08/2021   11:36 AM  05/01/2021    4:43 AM  CBC  WBC 3.8 - 10.8 Thousand/uL 9.3  11.4  11.6   Hemoglobin 13.2 - 17.1 g/dL 12.2  9.8  9.2   Hematocrit 38.5 - 50.0 % 37.3  30.5  28.7   Platelets 140 - 400 Thousand/uL 155  218  190       CMP     Component Value Date/Time   NA 137 08/06/2022 1045   NA 139 04/28/2012 0658   K 4.2 08/06/2022 1045   K 4.2 04/28/2012 0658   CL 101 08/06/2022 1045   CL 106 04/28/2012 0658   CO2 22 08/06/2022 1045   CO2 25 04/28/2012 0658   GLUCOSE 166 (H) 08/06/2022 1045   GLUCOSE 89 03/16/2022 1041   GLUCOSE 114 (H) 04/28/2012 0658   BUN 18 08/06/2022 1045   BUN 32 (H) 04/28/2012 0658   CREATININE 1.18 08/06/2022 1045   CREATININE 1.35 (H) 03/16/2022 1041   CALCIUM 9.0 08/06/2022 1045   CALCIUM 8.9 04/28/2012 0658   PROT 6.7 03/16/2022 1041   ALBUMIN 2.7 (L) 04/29/2021 1609   AST 37 (H) 03/16/2022 1041   ALT 26 03/16/2022 1041   ALKPHOS 52 04/29/2021 1609   BILITOT 0.5 03/16/2022 1041   GFRNONAA 44 (L) 05/01/2021 0443   GFRNONAA 39 (L) 12/31/2020 0833   GFRAA 45 (L) 12/31/2020 0833     No results found.     Assessment & Plan:   1. Hx of BKA, left ([place]) X-ray shows no evidence of fracture.  There is some edema in the soft tissue, which would  be consistent with recent falls.  I suspect the area of pain and discomfort that he is feeling is due to the trauma from recent falls.  Currently no role for intervention.  Patient is advised to continue with use of shrinker and to try to use ice and elevation when not utilizing prosthesis.  Will have patient return in 6 weeks for evaluation. - DG Tibia/Fibula Left; Future   Current Outpatient Medications on File Prior to Visit  Medication Sig Dispense Refill   acetaminophen (TYLENOL) 325 MG tablet Take 2 tablets (650 mg total) by mouth every 6 (six) hours as needed for mild pain (or temp >/= 101 F).     apixaban (ELIQUIS) 2.5 MG TABS tablet Take 1 tablet (2.5 mg total) by mouth 2 (two) times daily. 60 tablet 11    atenolol (TENORMIN) 25 MG tablet Take 1 tablet by mouth once daily 90 tablet 0   ferrous sulfate 325 (65 FE) MG EC tablet Take 325 mg by mouth at bedtime.     finasteride (PROSCAR) 5 MG tablet TAKE 1 TABLET BY MOUTH ONCE DAILY 90 tablet 3   hydrALAZINE (APRESOLINE) 25 MG tablet TAKE 3 TABLETS BY MOUTH THREE TIMES DAILY 270 tablet 5   Multiple Vitamin (DAILY MULTIVITAMIN PO) Take 1 tablet by mouth daily.     ONETOUCH ULTRA test strip USE 1 STRIP TO CHECK GLUCOSE ONCE DAILY 50 each 0   pentoxifylline (TRENTAL) 400 MG CR tablet TAKE 1 TABLET BY MOUTH EVERY 12 HOURS . APPOINTMENT REQUIRED FOR FUTURE REFILLS 180 tablet 1   rosuvastatin (CRESTOR) 40 MG tablet Take 1 tablet by mouth once daily 90 tablet 0   tamsulosin (FLOMAX) 0.4 MG CAPS capsule Take 1 capsule by mouth once daily 90 capsule 1   amLODipine (NORVASC) 10 MG tablet Take 1 tablet by mouth once daily 60 tablet 0   furosemide (LASIX) 40 MG tablet TAKE 1 TABLET BY MOUTH ONCE DAILY AS NEEDED FOR  LEG  SWELLING 30 tablet 0   No current facility-administered medications on file prior to visit.    There are no Patient Instructions on file for this visit. No follow-ups on file.   Kris Hartmann, NP

## 2022-09-28 NOTE — Patient Instructions (Signed)
Medication Instructions:  TAKE HYDRALAZINE TWICE DAY  *If you need a refill on your cardiac medications before your next appointment, please call your pharmacy*   Lab Work: BMET, PRO BNP, CBC, NMR  If you have labs (blood work) drawn today and your tests are completely normal, you will receive your results only by: MyChart Message (if you have MyChart) OR A paper copy in the mail If you have any lab test that is abnormal or we need to change your treatment, we will call you to review the results.   Testing/Procedures:    Follow-Up: At [facility], you and your health needs are our priority.  As part of our continuing mission to provide you with exceptional heart care, we have created designated Provider Care Teams.  These Care Teams include your primary Cardiologist (physician) and Advanced Practice Providers (APPs -  Physician Assistants and Nurse Practitioners) who all work together to provide you with the care you need, when you need it.  We recommend signing up for the patient portal called "MyChart".  Sign up information is provided on this After Visit Summary.  MyChart is used to connect with patients for Virtual Visits (Telemedicine).  Patients are able to view lab/test results, encounter notes, upcoming appointments, etc.  Non-urgent messages can be sent to your provider as well.   To learn more about what you can do with MyChart, go to [email or web address].    Your next appointment:   7 month(s)  Provider:   Dorris Carnes, MD     Other Instructions

## 2022-09-30 ENCOUNTER — Telehealth: Payer: Self-pay

## 2022-09-30 LAB — NMR, LIPOPROFILE
Cholesterol, Total: 113 mg/dL (ref 100–199)
HDL Particle Number: 25.7 umol/L — ABNORMAL LOW (ref >=30.5)
HDL-C: 46 mg/dL (ref >39)
LDL Particle Number: 588 nmol/L (ref <1000)
LDL Size: 20.2 nm — ABNORMAL LOW (ref >20.5)
LDL-C (NIH Calc): 47 mg/dL (ref 0–99)
LP-IR Score: 25 (ref <=45)
Small LDL Particle Number: 256 nmol/L (ref <=527)
Triglycerides: 111 mg/dL (ref 0–149)

## 2022-09-30 LAB — CBC
Hematocrit: 35.2 % — ABNORMAL LOW (ref 37.5–51.0)
Hemoglobin: 11.7 g/dL — ABNORMAL LOW (ref 13.0–17.7)
MCH: 29.5 pg (ref 26.6–33.0)
MCHC: 33.2 g/dL (ref 31.5–35.7)
MCV: 89 fL (ref 79–97)
Platelets: 216 10*3/uL (ref 150–450)
RBC: 3.97 x10E6/uL — ABNORMAL LOW (ref 4.14–5.80)
RDW: 14.6 % (ref 11.6–15.4)
WBC: 9.8 10*3/uL (ref 3.4–10.8)

## 2022-09-30 LAB — BASIC METABOLIC PANEL
BUN/Creatinine Ratio: 11 (ref 10–24)
BUN: 28 mg/dL (ref 10–36)
CO2: 23 mmol/L (ref 20–29)
Calcium: 9.1 mg/dL (ref 8.6–10.2)
Chloride: 106 mmol/L (ref 96–106)
Creatinine, Ser: 2.5 mg/dL — ABNORMAL HIGH (ref 0.76–1.27)
Glucose: 79 mg/dL (ref 70–99)
Potassium: 3.4 mmol/L — ABNORMAL LOW (ref 3.5–5.2)
Sodium: 145 mmol/L — ABNORMAL HIGH (ref 134–144)
eGFR: 24 mL/min/{1.73_m2} — ABNORMAL LOW (ref >59)

## 2022-09-30 LAB — PRO B NATRIURETIC PEPTIDE: NT-Pro BNP: 2173 pg/mL — ABNORMAL HIGH (ref 0–486)

## 2022-09-30 NOTE — Telephone Encounter (Signed)
-----   Message from Deberah Pelton, NP sent at 09/29/2022 12:20 PM EST ----- Sent to me by mistake, forwarding to you for review .  Jossie Ng. Cleaver NP-C    09/29/2022, 12:20 PM [place] [address] Office [phone] Fax [phone]  ----- Message ----- From: Lavone Neri Lab Results In Sent: 09/28/2022   5:36 PM EST To: Deberah Pelton, NP

## 2022-09-30 NOTE — Telephone Encounter (Signed)
Spoke with Dr Ali Lowe, DOD who reviewed pt's labs.  He advises pt hold his Lasix for the next "few days and repeat lab."   Attempted phone call to pt and left voicemail message to contact triage at [phone].

## 2022-09-30 NOTE — Addendum Note (Signed)
Addended by: Callie Fielding D on: 09/30/2022 03:50 PM   Modules accepted: Orders

## 2022-10-01 ENCOUNTER — Ambulatory Visit: Payer: PPO | Admitting: General Practice

## 2022-10-02 ENCOUNTER — Other Ambulatory Visit: Payer: Self-pay | Admitting: Family Medicine

## 2022-10-02 DIAGNOSIS — I4892 Unspecified atrial flutter: Secondary | ICD-10-CM

## 2022-10-02 NOTE — Telephone Encounter (Signed)
Requested Prescriptions  Pending Prescriptions Disp Refills   apixaban (ELIQUIS) 2.5 MG TABS tablet [Pharmacy Med Name: Eliquis 2.5 MG Oral Tablet] 180 tablet 1    Sig: Take 1 tablet (2.5 mg total) by mouth 2 (two) times daily. OFFICE VISIT NEEDED FOR ADDITIONAL REFILLS     Hematology:  Anticoagulants - apixaban Failed - 10/02/2022  4:12 PM      Failed - HGB in normal range and within 360 days    Hemoglobin  Date Value Ref Range Status  09/28/2022 11.7 (L) 13.0 - 17.7 g/dL Final         Failed - HCT in normal range and within 360 days    Hematocrit  Date Value Ref Range Status  09/28/2022 35.2 (L) 37.5 - 51.0 % Final         Failed - Cr in normal range and within 360 days    Creat  Date Value Ref Range Status  03/16/2022 1.35 (H) 0.70 - 1.22 mg/dL Final   Creatinine, Ser  Date Value Ref Range Status  09/28/2022 2.50 (H) 0.76 - 1.27 mg/dL Final   Creatinine,U  Date Value Ref Range Status  02/19/2011 48.6 mg/dL Final   Creatinine, Urine  Date Value Ref Range Status  08/13/2016 62 20 - 370 mg/dL Final         Failed - AST in normal range and within 360 days    AST  Date Value Ref Range Status  03/16/2022 37 (H) 10 - 35 U/L Final         Failed - Valid encounter within last 12 months    Recent Outpatient Visits           1 year ago [place], Jessica A, NP   1 year ago Immunization due   [place] Pick[place], Cammie Mcgee, MD   1 year ago Diverticulosis   [place], Warren T, MD   1 year ago Type 2 diabetes mellitus with diabetic peripheral angiopathy with gangrene, unspecified whether long term insulin use ([place])   [place] [name], MD   2 years ago Hematuria, unspecified type   [place], Jessica A, NP              Passed - PLT in normal range and within 360 days    Platelets  Date Value Ref Range Status  09/28/2022 216  150 - 450 x10E3/uL Final         Passed - ALT in normal range and within 360 days    ALT  Date Value Ref Range Status  03/16/2022 26 9 - 46 U/L Final

## 2022-10-05 ENCOUNTER — Ambulatory Visit: Payer: PPO | Attending: Internal Medicine

## 2022-10-05 ENCOUNTER — Other Ambulatory Visit: Payer: Self-pay

## 2022-10-05 DIAGNOSIS — I10 Essential (primary) hypertension: Secondary | ICD-10-CM | POA: Diagnosis not present

## 2022-10-05 DIAGNOSIS — Z79899 Other long term (current) drug therapy: Secondary | ICD-10-CM | POA: Diagnosis not present

## 2022-10-05 DIAGNOSIS — R7989 Other specified abnormal findings of blood chemistry: Secondary | ICD-10-CM

## 2022-10-05 NOTE — Progress Notes (Signed)
Lab unable to release lab orders.  Requesting labs be re-ordered as per Dr Harrington Challenger requested

## 2022-10-06 ENCOUNTER — Telehealth: Payer: Self-pay

## 2022-10-06 DIAGNOSIS — Z79899 Other long term (current) drug therapy: Secondary | ICD-10-CM

## 2022-10-06 DIAGNOSIS — E1152 Type 2 diabetes mellitus with diabetic peripheral angiopathy with gangrene: Secondary | ICD-10-CM

## 2022-10-06 DIAGNOSIS — R7989 Other specified abnormal findings of blood chemistry: Secondary | ICD-10-CM

## 2022-10-06 DIAGNOSIS — R609 Edema, unspecified: Secondary | ICD-10-CM

## 2022-10-06 DIAGNOSIS — I739 Peripheral vascular disease, unspecified: Secondary | ICD-10-CM

## 2022-10-06 DIAGNOSIS — I2510 Atherosclerotic heart disease of native coronary artery without angina pectoris: Secondary | ICD-10-CM

## 2022-10-06 DIAGNOSIS — N183 Chronic kidney disease, stage 3 unspecified: Secondary | ICD-10-CM

## 2022-10-06 DIAGNOSIS — I10 Essential (primary) hypertension: Secondary | ICD-10-CM

## 2022-10-06 LAB — BASIC METABOLIC PANEL
BUN/Creatinine Ratio: 20 (ref 10–24)
BUN: 24 mg/dL (ref 10–36)
CO2: 19 mmol/L — ABNORMAL LOW (ref 20–29)
Calcium: 8.9 mg/dL (ref 8.6–10.2)
Chloride: 106 mmol/L (ref 96–106)
Creatinine, Ser: 1.21 mg/dL (ref 0.76–1.27)
Glucose: 111 mg/dL — ABNORMAL HIGH (ref 70–99)
Potassium: 4.6 mmol/L (ref 3.5–5.2)
Sodium: 139 mmol/L (ref 134–144)
eGFR: 56 mL/min/{1.73_m2} — ABNORMAL LOW (ref >59)

## 2022-10-06 LAB — PRO B NATRIURETIC PEPTIDE: NT-Pro BNP: 2429 pg/mL — ABNORMAL HIGH (ref 0–486)

## 2022-10-06 MED ORDER — FUROSEMIDE 40 MG PO TABS: 40.0000 mg | ORAL_TABLET | Freq: Every day | ORAL | 0 refills | Status: DC

## 2022-10-06 NOTE — Telephone Encounter (Signed)
-----   Message from Fay Records, MD sent at 10/06/2022  7:24 AM EST ----- Kidney function is back at baseline   Cr 1.21    Fluid is still up He had lasix 40 mg as needed then went to daily   I would recomm 60 mg  2x per week   times per week   Watch/limit salt  Follow ankles    BMET / BNP in 2 wks

## 2022-10-06 NOTE — Telephone Encounter (Signed)
Pt says he has been taking the lasix 40 mg Mon, Wed, and Fri only... he will start taking daily.Marland KitchenMarland Kitchen I will forward to Dr Harrington Challenger to see if need to still add the extra 20 mg twice a week.   He will have labs in 2 weeks at Dr Samella Parr office.

## 2022-10-06 NOTE — Telephone Encounter (Signed)
Take daily

## 2022-10-19 ENCOUNTER — Other Ambulatory Visit: Payer: PPO

## 2022-10-19 DIAGNOSIS — N179 Acute kidney failure, unspecified: Secondary | ICD-10-CM

## 2022-10-19 DIAGNOSIS — E871 Hypo-osmolality and hyponatremia: Secondary | ICD-10-CM

## 2022-10-19 DIAGNOSIS — I160 Hypertensive urgency: Secondary | ICD-10-CM

## 2022-10-19 DIAGNOSIS — I10 Essential (primary) hypertension: Secondary | ICD-10-CM

## 2022-10-19 NOTE — Addendum Note (Signed)
Addended by: Randal Buba K on: 10/19/2022 02:45 PM   Modules accepted: Orders

## 2022-10-20 LAB — BASIC METABOLIC PANEL
BUN/Creatinine Ratio: 18 (calc) (ref 6–22)
BUN: 31 mg/dL — ABNORMAL HIGH (ref 7–25)
CO2: 30 mmol/L (ref 20–32)
Calcium: 9.5 mg/dL (ref 8.6–10.3)
Chloride: 102 mmol/L (ref 98–110)
Creat: 1.71 mg/dL — ABNORMAL HIGH (ref 0.70–1.22)
Glucose, Bld: 120 mg/dL — ABNORMAL HIGH (ref 65–99)
Potassium: 3.7 mmol/L (ref 3.5–5.3)
Sodium: 142 mmol/L (ref 135–146)

## 2022-10-20 LAB — BRAIN NATRIURETIC PEPTIDE: Brain Natriuretic Peptide: 147 pg/mL — ABNORMAL HIGH (ref <100)

## 2022-11-02 ENCOUNTER — Ambulatory Visit: Payer: PPO | Admitting: Podiatry

## 2022-11-06 ENCOUNTER — Other Ambulatory Visit: Payer: Self-pay | Admitting: Family Medicine

## 2022-11-06 DIAGNOSIS — I10 Essential (primary) hypertension: Secondary | ICD-10-CM

## 2022-11-06 NOTE — Telephone Encounter (Signed)
Requested medication (s) are due for refill today: yes  Requested medication (s) are on the active medication list: yes  Last refill:  multiple dates  Future visit scheduled: no  Notes to clinic:  Unable to refill per protocol, courtesy refill already given, routing for provider approval.      Requested Prescriptions  Pending Prescriptions Disp Refills   rosuvastatin (CRESTOR) 40 MG tablet [Pharmacy Med Name: Rosuvastatin Calcium 40 MG Oral Tablet] 90 tablet 0    Sig: TAKE 1 TABLET BY MOUTH ONCE DAILY (NEEDS  CPE  APPOINTMENT  WITH  PCP  FOR  FUTURE  REFILLS)     Cardiovascular:  Antilipid - Statins 2 Failed - 11/06/2022 12:53 PM      Failed - Cr in normal range and within 360 days    Creat  Date Value Ref Range Status  10/19/2022 1.71 (H) 0.70 - 1.22 mg/dL Final   Creatinine,U  Date Value Ref Range Status  02/19/2011 48.6 mg/dL Final   Creatinine, Urine  Date Value Ref Range Status  08/13/2016 62 20 - 370 mg/dL Final         Failed - Valid encounter within last 12 months    Recent Outpatient Visits           1 year ago [place] Eulogio Bear, NP   1 year ago Immunization due   [place] Pickard, Cammie Mcgee, MD   1 year ago Diverticulosis   [place] [name], MD   1 year ago Type 2 diabetes mellitus with diabetic peripheral angiopathy with gangrene, unspecified whether long term insulin use ([place])   [place] [name], MD   2 years ago Hematuria, unspecified type   [place] Noemi Chapel A, NP              Failed - Lipid Panel in normal range within the last 12 months    Cholesterol, Total  Date Value Ref Range Status  12/09/2020 83 (L) 100 - 199 mg/dL Final   Cholesterol  Date Value Ref Range Status  03/16/2022 90 <200 mg/dL Final   LDL Cholesterol (Calc)  Date Value Ref Range Status  03/16/2022 30 mg/dL (calc) Final     Comment:    Reference range: <100 . Desirable range <100 mg/dL for primary prevention;   <70 mg/dL for patients with CHD or diabetic patients  with > or = 2 CHD risk factors. [name] LDL-C is now calculated using the Martin-Hopkins  calculation, which is a validated novel method providing  better accuracy than the Friedewald equation in the  estimation of LDL-C.  Cresenciano Genre et al. Annamaria Helling. [id number]): 2061-2068  (http://education.[facility].com/faq/FAQ164)    Direct LDL  Date Value Ref Range Status  02/19/2011 99.1 mg/dL Final    Comment:    Optimal:  <100 mg/dLNear or Above Optimal:  100-129 mg/dLBorderline High:  130-159 mg/dLHigh:  160-189 mg/dLVery High:  >190 mg/dL   HDL  Date Value Ref Range Status  03/16/2022 35 (L) > OR = 40 mg/dL Final  12/09/2020 30 (L) >39 mg/dL Final   Triglycerides  Date Value Ref Range Status  03/16/2022 168 (H) <150 mg/dL Final         Passed - Patient is not pregnant       pentoxifylline (TRENTAL) 400 MG CR tablet [Pharmacy Med Name: Pentoxifylline ER 400 MG Oral Tablet Extended Release] 180 tablet 0  Sig: TAKE 1 TABLET BY MOUTH EVERY 12 HOURS . APPOINTMENT REQUIRED FOR FUTURE REFILLS     Hematology:  Antiplatelets - pentoxifylline Failed - 11/06/2022 12:53 PM      Failed - Cr in normal range and within 360 days    Creat  Date Value Ref Range Status  10/19/2022 1.71 (H) 0.70 - 1.22 mg/dL Final   Creatinine,U  Date Value Ref Range Status  02/19/2011 48.6 mg/dL Final   Creatinine, Urine  Date Value Ref Range Status  08/13/2016 62 20 - 370 mg/dL Final         Failed - HGB in normal range and within 180 days    Hemoglobin  Date Value Ref Range Status  09/28/2022 11.7 (L) 13.0 - 17.7 g/dL Final         Failed - HCT in normal range and within 180 days    Hematocrit  Date Value Ref Range Status  09/28/2022 35.2 (L) 37.5 - 51.0 % Final         Failed - Valid encounter within last 6 months    Recent Outpatient Visits            1 year ago [place], Jessica A, NP   1 year ago Immunization due   [place] Pickard, Cammie Mcgee, MD   1 year ago Diverticulosis   [place] [name], MD   1 year ago Type 2 diabetes mellitus with diabetic peripheral angiopathy with gangrene, unspecified whether long term insulin use ([place])   [place] [name], MD   2 years ago Hematuria, unspecified type   [place] Noemi Chapel A, NP              Passed - PLT in normal range and within 180 days    Platelets  Date Value Ref Range Status  09/28/2022 216 150 - 450 x10E3/uL Final          amLODipine (NORVASC) 10 MG tablet [Pharmacy Med Name: amLODIPine Besylate 10 MG Oral Tablet] 60 tablet 0    Sig: Take 1 tablet by mouth once daily     Cardiovascular: Calcium Channel Blockers 2 Failed - 11/06/2022 12:53 PM      Failed - Valid encounter within last 6 months    Recent Outpatient Visits           1 year ago [place], Jessica A, NP   1 year ago Immunization due   [place] Pickard, Cammie Mcgee, MD   1 year ago Diverticulosis   [place] [name], MD   1 year ago Type 2 diabetes mellitus with diabetic peripheral angiopathy with gangrene, unspecified whether long term insulin use ([place])   [place] Pickard, Cammie Mcgee, MD   2 years ago Hematuria, unspecified type   [place] Noemi Chapel A, NP              Passed - Last BP in normal range    BP Readings from Last 1 Encounters:  09/28/22 (!) 120/54         Passed - Last Heart Rate in normal range    Pulse Readings from Last 1 Encounters:  09/28/22 66          tamsulosin (FLOMAX) 0.4 MG CAPS capsule [Pharmacy Med Name: Tamsulosin HCl 0.4 MG  Oral Capsule] 90 capsule 0    Sig: Take 1 capsule by mouth once  daily     Urology: Alpha-Adrenergic Blocker Failed - 11/06/2022 12:53 PM      Failed - PSA in normal range and within 360 days    PSA  Date Value Ref Range Status  12/16/2015 0.70 <=4.00 ng/mL Final    Comment:    Test Methodology: ECLIA PSA (Electrochemiluminescence Immunoassay)   For PSA values from 2.5-4.0, particularly in younger men <48 years old, the AUA and NCCN suggest testing for % Free PSA (3515) and evaluation of the rate of increase in PSA (PSA velocity).          Failed - Valid encounter within last 12 months    Recent Outpatient Visits           1 year ago [place], Jessica A, NP   1 year ago Immunization due   [place] Pickard, Cammie Mcgee, MD   1 year ago Diverticulosis   [place] [name], MD   1 year ago Type 2 diabetes mellitus with diabetic peripheral angiopathy with gangrene, unspecified whether long term insulin use ([place])   [place] [name], MD   2 years ago Hematuria, unspecified type   [place] Noemi Chapel A, NP              Passed - Last BP in normal range    BP Readings from Last 1 Encounters:  09/28/22 (!) 120/54

## 2022-11-13 ENCOUNTER — Other Ambulatory Visit: Payer: Self-pay

## 2022-11-13 ENCOUNTER — Other Ambulatory Visit: Payer: Self-pay | Admitting: Family Medicine

## 2022-11-16 ENCOUNTER — Encounter: Payer: Self-pay | Admitting: Family Medicine

## 2022-11-16 ENCOUNTER — Ambulatory Visit (INDEPENDENT_AMBULATORY_CARE_PROVIDER_SITE_OTHER): Payer: PPO | Admitting: Family Medicine

## 2022-11-16 VITALS — BP 128/68 | HR 76 | Temp 98.1°F | Ht 65.0 in | Wt 166.8 lb

## 2022-11-16 DIAGNOSIS — E11628 Type 2 diabetes mellitus with other skin complications: Secondary | ICD-10-CM | POA: Diagnosis not present

## 2022-11-16 DIAGNOSIS — R0600 Dyspnea, unspecified: Secondary | ICD-10-CM | POA: Diagnosis not present

## 2022-11-16 DIAGNOSIS — I482 Chronic atrial fibrillation, unspecified: Secondary | ICD-10-CM

## 2022-11-16 DIAGNOSIS — L84 Corns and callosities: Secondary | ICD-10-CM

## 2022-11-16 NOTE — Progress Notes (Signed)
Subjective:    Patient ID: Eric MontaneJoseph W Bingley [name], male    DOB: Nov 13, 1927, 89 y.o.   MRN: [id number]  Medication Refill  Patient has a history of diet-controlled diabetes, coronary artery disease, chronic kidney disease stage III, peripheral vascular disease status post left BKA.  He is here today for recheck.  Last creatinine was 1.7 in March.  He is currently taking Lasix every other day.  He has +1 pitting edema in his right leg to above the knee.  He also reports dyspnea on exertion.  He denies any orthopnea or paroxysmal nocturnal dyspnea.  He denies any chest pain.  He has no sensation in his right foot although he does have palpable dorsalis pedis and posterior tibialis pulses Past Medical History:  Diagnosis Date   Atrial flutter    s/p RFCA   CAD (coronary artery disease)    cath 2003, occluded S-RCA, occluded S-Dx, L-LAD ok, s/p PTCA to LAD   Cataract    CKD (chronic kidney disease) stage 3, GFR 30-59 ml/min    Diabetes mellitus    diet controlled   Hearing aid worn    bilateral   History of kidney stones    Long term (current) use of anticoagulants    Neuromuscular disorder    OSA (obstructive sleep apnea) 12/11   very mild, AHI 7/hr   Persistent atrial fibrillation 09/26/2015   Pure hyperglyceridemia    PVD (peripheral vascular disease)    angioplasty of his right lower extremity in [place] by Dr.[name] 2013   Unspecified essential hypertension    Wears dentures    full upper and lower   Past Surgical History:  Procedure Laterality Date   ABDOMINAL AORTOGRAM W/LOWER EXTREMITY N/A 11/15/2017   Procedure: ABDOMINAL AORTOGRAM W/LOWER EXTREMITY;  Surgeon: Sherren KernsFields, Charles E, MD;  Location: [facility];  Service: Cardiovascular;  Laterality: N/A;   Adenosine Myoview  3/06   EF 56%, neg. Ischemia   Adenosine Myoview  02/18/07   nml   AMPUTATION Left 12/15/2017   Procedure: AMPUTATION BELOW KNEE;  Surgeon: Annice Needyew, Jason S, MD;  Location: [facility] ORS;  Service: Vascular;   Laterality: Left;   ANGIOPLASTY  1/99   CAD- diogonal with rotational artherectomy   Arthrectomy     of LAD & PTCA   BLEPHAROPLASTY Bilateral    CARDIAC CATHETERIZATION  1/00   CARDIOVERSION  1/04   CARDIOVERSION  5/07   hospital- a flutter   CARPAL TUNNEL RELEASE     ? bilateral   CATARACT EXTRACTION W/PHACO Right 07/28/2017   Procedure: CATARACT EXTRACTION PHACO AND INTRAOCULAR LENS PLACEMENT (IOC) RIGHT DIABETIC;  Surgeon: Lockie MolaBrasington, Chadwick, MD;  [facility];  Service: Ophthalmology;  Laterality: Right;  Diabetic - diet controlled   CATARACT EXTRACTION W/PHACO Left 08/18/2017   Procedure: CATARACT EXTRACTION PHACO AND INTRAOCULAR LENS PLACEMENT (IOC);  Surgeon: Lockie MolaBrasington, Chadwick, MD;  [facility];  Service: Ophthalmology;  Laterality: Left;  DIABETES - oral meds   COLONOSCOPY W/ BIOPSIES  10/01/06   sigmoid polyp bx neg, 3 years   COLONOSCOPY WITH PROPOFOL N/A 04/28/2021   Procedure: COLONOSCOPY WITH PROPOFOL;  Surgeon: Iva BoopGessner, Carl E, MD;  [facility];  Service: Endoscopy;  Laterality: N/A;   CORONARY ANGIOPLASTY  4/03   cutting balloon PTCA pLAD into Diag   CORONARY ARTERY BYPASS GRAFT  2000   LIMA-LAD, SVG-RCA, SVG-Diag; SVG-Diag & SVG-RCA occluded 2003   FRACTURE SURGERY     HAND SURGERY  08/27/09  R thumb procedure wit Scaphoid Gragt and screws, Dr Merlyn Lot   LOWER EXTREMITY ANGIOGRAPHY Right 01/25/2019   Procedure: LOWER EXTREMITY ANGIOGRAPHY;  Surgeon: Renford Dills, MD;  Location: [facility] INVASIVE CV LAB;  Service: Cardiovascular;  Laterality: Right;   NM MYOVIEW LTD  4/11   normal   PERIPHERAL VASCULAR CATHETERIZATION Right 06/27/2015   Procedure: Lower Extremity Angiography;  Surgeon: Annice Needy, MD;  Location: [facility] INVASIVE CV LAB;  Service: Cardiovascular;  Laterality: Right;   PERIPHERAL VASCULAR CATHETERIZATION  06/27/2015   Procedure: Lower Extremity Intervention;  Surgeon: Annice Needy, MD;  Location: [facility] INVASIVE  CV LAB;  Service: Cardiovascular;;   POLYPECTOMY  04/28/2021   Procedure: POLYPECTOMY;  Surgeon: Iva Boop, MD;  Location: [facility] ENDOSCOPY;  Service: Endoscopy;;   Current Outpatient Medications on File Prior to Visit  Medication Sig Dispense Refill   acetaminophen (TYLENOL) 325 MG tablet Take 2 tablets (650 mg total) by mouth every 6 (six) hours as needed for mild pain (or temp >/= 101 F).     amLODipine (NORVASC) 10 MG tablet Take 1 tablet by mouth once daily 60 tablet 0   apixaban (ELIQUIS) 2.5 MG TABS tablet Take 1 tablet (2.5 mg total) by mouth 2 (two) times daily. OFFICE VISIT NEEDED FOR ADDITIONAL REFILLS 180 tablet 1   atenolol (TENORMIN) 25 MG tablet Take 1 tablet by mouth once daily 90 tablet 0   ferrous sulfate 325 (65 FE) MG EC tablet Take 325 mg by mouth at bedtime.     finasteride (PROSCAR) 5 MG tablet TAKE 1 TABLET BY MOUTH ONCE DAILY 90 tablet 3   furosemide (LASIX) 40 MG tablet Take 1 tablet (40 mg total) by mouth daily. 90 tablet 0   hydrALAZINE (APRESOLINE) 25 MG tablet Take 3 tablets (75 mg total) by mouth in the morning and at bedtime. 180 tablet 3   Multiple Vitamin (DAILY MULTIVITAMIN PO) Take 1 tablet by mouth daily.     ONETOUCH ULTRA test strip USE 1 STRIP TO CHECK GLUCOSE ONCE DAILY 50 each 0   pentoxifylline (TRENTAL) 400 MG CR tablet TAKE 1 TABLET BY MOUTH EVERY 12 HOURS . APPOINTMENT REQUIRED FOR FUTURE REFILLS 180 tablet 1   rosuvastatin (CRESTOR) 40 MG tablet Take 1 tablet by mouth once daily 90 tablet 0   tamsulosin (FLOMAX) 0.4 MG CAPS capsule Take 1 capsule by mouth once daily 90 capsule 1   No current facility-administered medications on file prior to visit.   Allergies  Allergen Reactions   Isosorbide Mononitrate Other (See Comments)    Unknown, doesn't remember    Ramipril Cough and Other (See Comments)   Quinidine Gluconate Rash   Quinidine Gluconate Other (See Comments) and Rash   Social History   Socioeconomic History   Marital status:  Married    Spouse name: Eric [name]   Number of children: 5   Years of education: Not on file   Highest education level: Not on file  Occupational History   Occupation: [employer]    Employer: RETIRED  Tobacco Use   Smoking status: Never   Smokeless tobacco: Former    Quit date: 1994  Building services engineer Use: Never used  Substance and Sexual Activity   Alcohol use: No   Drug use: No   Sexual activity: Never  Other Topics Concern   Not on file  Social History Narrative   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 Married x 73 years  in 2022.   5 kids   11 grandchildren.   20 great grandchilren.   Social Determinants of Health   Financial Resource Strain: Low Risk  (08/12/2022)   Overall Financial Resource Strain (CARDIA)    Difficulty of Paying Living Expenses: Not hard at all  Food Insecurity: No Food Insecurity (08/12/2022)   Hunger Vital Sign    Worried About Running Out of Food in the Last Year: Never true    Ran Out of Food in the Last Year: Never true  Transportation Needs: No Transportation Needs (08/12/2022)   PRAPARE - Administrator, Civil Service (Medical): No    Lack of Transportation (Non-Medical): No  Physical Activity: Insufficiently Active (08/12/2022)   Exercise Vital Sign    Days of Exercise per Week: 5 days    Minutes of Exercise per Session: 20 min  Stress: No Stress Concern Present (08/12/2022)   [employer] of Occupational Health - Occupational Stress Questionnaire    Feeling of Stress : Not at all  Social Connections: Socially Integrated (08/12/2022)   Social Connection and Isolation Panel [NHANES]    Frequency of Communication with Friends and Family: More than three times a week    Frequency of Social Gatherings with Friends and Family: More than three times a week    Attends Religious Services: More than 4 times per year    Active Member of [employer] or Organizations: Yes    Attends Banker Meetings: More  than 4 times per year    Marital Status: Married  Catering manager Violence: Not At Risk (08/12/2022)   Humiliation, Afraid, Rape, and Kick questionnaire    Fear of Current or Ex-Partner: No    Emotionally Abused: No    Physically Abused: No    Sexually Abused: No      Review of Systems  All other systems reviewed and are negative.      Objective:   Physical Exam Vitals reviewed.  Constitutional:      Appearance: Normal appearance.  Cardiovascular:     Rate and Rhythm: Normal rate. Rhythm irregular.     Pulses: Normal pulses.     Heart sounds: Normal heart sounds.  Pulmonary:     Effort: Pulmonary effort is normal.     Breath sounds: Normal breath sounds. No wheezing, rhonchi or rales.  Abdominal:     General: Abdomen is flat.     Palpations: Abdomen is soft.  Musculoskeletal:     Right lower leg: Edema present.  Neurological:     Mental Status: He is alert.           Assessment & Plan:  Type 2 diabetes mellitus with pressure callus - Plan: Hemoglobin A1c, CBC with Differential/Platelet, Lipid panel, COMPLETE METABOLIC PANEL WITH GFR  Chronic atrial fibrillation - Plan: Brain natriuretic peptide Patient's heart rate is well-controlled.  He is appropriately anticoagulated.  His blood pressure is good.  Check CBC CMP lipid panel and A1c.  Also check BNP.  I am concerned that the patient is showing signs of fluid overload his renal function can tolerate it I would like to increase his Lasix to every day.

## 2022-11-17 LAB — CBC WITH DIFFERENTIAL/PLATELET
Hemoglobin: 12 g/dL — ABNORMAL LOW (ref 13.2–17.1)
Lymphs Abs: 3294 cells/uL (ref 850–3900)
MCH: 29.5 pg (ref 27.0–33.0)
MCHC: 33 g/dL (ref 32.0–36.0)
Monocytes Relative: 5.9 %

## 2022-11-17 LAB — COMPLETE METABOLIC PANEL WITH GFR
ALT: 39 U/L (ref 9–46)
AST: 45 U/L — ABNORMAL HIGH (ref 10–35)
Calcium: 8.9 mg/dL (ref 8.6–10.3)
Glucose, Bld: 103 mg/dL — ABNORMAL HIGH (ref 65–99)
Sodium: 142 mmol/L (ref 135–146)
eGFR: 49 mL/min/{1.73_m2} — ABNORMAL LOW (ref >=60)

## 2022-11-17 LAB — BRAIN NATRIURETIC PEPTIDE: Brain Natriuretic Peptide: 140 pg/mL — ABNORMAL HIGH (ref <100)

## 2022-11-17 LAB — LIPID PANEL: Non-HDL Cholesterol (Calc): 62 mg/dL (calc) (ref <130)

## 2022-11-18 LAB — LIPID PANEL
Cholesterol: 104 mg/dL (ref <200)
HDL: 42 mg/dL (ref >=40)
LDL Cholesterol (Calc): 44 mg/dL (calc)
Total CHOL/HDL Ratio: 2.5 (calc) (ref <5.0)
Triglycerides: 95 mg/dL (ref <150)

## 2022-11-18 LAB — CBC WITH DIFFERENTIAL/PLATELET
Absolute Monocytes: 531 cells/uL (ref 200–950)
Basophils Absolute: 63 cells/uL (ref 0–200)
Basophils Relative: 0.7 %
Eosinophils Absolute: 171 cells/uL (ref 15–500)
Eosinophils Relative: 1.9 %
HCT: 36.4 % — ABNORMAL LOW (ref 38.5–50.0)
MCV: 89.4 fL (ref 80.0–100.0)
MPV: 10.5 fL (ref 7.5–12.5)
Neutro Abs: 4941 cells/uL (ref 1500–7800)
Neutrophils Relative %: 54.9 %
Platelets: 164 10*3/uL (ref 140–400)
RBC: 4.07 10*6/uL — ABNORMAL LOW (ref 4.20–5.80)
RDW: 14.6 % (ref 11.0–15.0)
Total Lymphocyte: 36.6 %
WBC: 9 10*3/uL (ref 3.8–10.8)

## 2022-11-18 LAB — COMPLETE METABOLIC PANEL WITH GFR
AG Ratio: 1.3 (calc) (ref 1.0–2.5)
Albumin: 3.4 g/dL — ABNORMAL LOW (ref 3.6–5.1)
Alkaline phosphatase (APISO): 57 U/L (ref 35–144)
BUN/Creatinine Ratio: 14 (calc) (ref 6–22)
BUN: 19 mg/dL (ref 7–25)
CO2: 29 mmol/L (ref 20–32)
Chloride: 106 mmol/L (ref 98–110)
Creat: 1.35 mg/dL — ABNORMAL HIGH (ref 0.70–1.22)
Globulin: 2.7 g/dL (calc) (ref 1.9–3.7)
Potassium: 4.7 mmol/L (ref 3.5–5.3)
Total Bilirubin: 0.5 mg/dL (ref 0.2–1.2)
Total Protein: 6.1 g/dL (ref 6.1–8.1)

## 2022-11-18 LAB — HEMOGLOBIN A1C
Hgb A1c MFr Bld: 5.8 % of total Hgb — ABNORMAL HIGH (ref <5.7)
Mean Plasma Glucose: 120 mg/dL
eAG (mmol/L): 6.6 mmol/L

## 2022-11-23 ENCOUNTER — Other Ambulatory Visit: Payer: PPO

## 2022-11-23 DIAGNOSIS — N183 Chronic kidney disease, stage 3 unspecified: Secondary | ICD-10-CM | POA: Diagnosis not present

## 2022-11-23 DIAGNOSIS — I10 Essential (primary) hypertension: Secondary | ICD-10-CM | POA: Diagnosis not present

## 2022-11-24 LAB — BASIC METABOLIC PANEL
BUN/Creatinine Ratio: 17 (calc) (ref 6–22)
BUN: 29 mg/dL — ABNORMAL HIGH (ref 7–25)
CO2: 29 mmol/L (ref 20–32)
Calcium: 9.2 mg/dL (ref 8.6–10.3)
Chloride: 99 mmol/L (ref 98–110)
Creat: 1.7 mg/dL — ABNORMAL HIGH (ref 0.70–1.22)
Glucose, Bld: 114 mg/dL — ABNORMAL HIGH (ref 65–99)
Potassium: 4 mmol/L (ref 3.5–5.3)
Sodium: 139 mmol/L (ref 135–146)

## 2022-11-30 ENCOUNTER — Inpatient Hospital Stay (HOSPITAL_COMMUNITY)
Admission: EM | Admit: 2022-11-30 | Discharge: 2022-12-04 | DRG: 536 | Disposition: A | Discharge status: Rehabilitation Facility | Payer: PPO | Attending: Internal Medicine | Admitting: Internal Medicine

## 2022-11-30 ENCOUNTER — Other Ambulatory Visit: Payer: Self-pay

## 2022-11-30 ENCOUNTER — Emergency Department (HOSPITAL_COMMUNITY): Payer: PPO

## 2022-11-30 ENCOUNTER — Encounter (HOSPITAL_COMMUNITY): Payer: Self-pay

## 2022-11-30 ENCOUNTER — Telehealth: Payer: Self-pay | Admitting: Family Medicine

## 2022-11-30 DIAGNOSIS — I10 Essential (primary) hypertension: Secondary | ICD-10-CM | POA: Diagnosis present

## 2022-11-30 DIAGNOSIS — I4892 Unspecified atrial flutter: Secondary | ICD-10-CM | POA: Diagnosis not present

## 2022-11-30 DIAGNOSIS — Z800 Family history of malignant neoplasm of digestive organs: Secondary | ICD-10-CM

## 2022-11-30 DIAGNOSIS — S72002A Fracture of unspecified part of neck of left femur, initial encounter for closed fracture: Principal | ICD-10-CM

## 2022-11-30 DIAGNOSIS — I4819 Other persistent atrial fibrillation: Secondary | ICD-10-CM | POA: Diagnosis present

## 2022-11-30 DIAGNOSIS — S72115D Nondisplaced fracture of greater trochanter of left femur, subsequent encounter for closed fracture with routine healing: Secondary | ICD-10-CM | POA: Diagnosis not present

## 2022-11-30 DIAGNOSIS — N401 Enlarged prostate with lower urinary tract symptoms: Secondary | ICD-10-CM | POA: Diagnosis not present

## 2022-11-30 DIAGNOSIS — R338 Other retention of urine: Secondary | ICD-10-CM | POA: Diagnosis not present

## 2022-11-30 DIAGNOSIS — E781 Pure hyperglyceridemia: Secondary | ICD-10-CM

## 2022-11-30 DIAGNOSIS — R102 Pelvic and perineal pain: Secondary | ICD-10-CM | POA: Diagnosis not present

## 2022-11-30 DIAGNOSIS — E1122 Type 2 diabetes mellitus with diabetic chronic kidney disease: Secondary | ICD-10-CM | POA: Diagnosis present

## 2022-11-30 DIAGNOSIS — Z9861 Coronary angioplasty status: Secondary | ICD-10-CM

## 2022-11-30 DIAGNOSIS — G4700 Insomnia, unspecified: Secondary | ICD-10-CM | POA: Diagnosis not present

## 2022-11-30 DIAGNOSIS — Z89512 Acquired absence of left leg below knee: Secondary | ICD-10-CM

## 2022-11-30 DIAGNOSIS — F54 Psychological and behavioral factors associated with disorders or diseases classified elsewhere: Secondary | ICD-10-CM | POA: Diagnosis not present

## 2022-11-30 DIAGNOSIS — W1830XA Fall on same level, unspecified, initial encounter: Secondary | ICD-10-CM | POA: Diagnosis present

## 2022-11-30 DIAGNOSIS — N179 Acute kidney failure, unspecified: Secondary | ICD-10-CM | POA: Diagnosis present

## 2022-11-30 DIAGNOSIS — Z87442 Personal history of urinary calculi: Secondary | ICD-10-CM

## 2022-11-30 DIAGNOSIS — Z801 Family history of malignant neoplasm of trachea, bronchus and lung: Secondary | ICD-10-CM

## 2022-11-30 DIAGNOSIS — Y92009 Unspecified place in unspecified non-institutional (private) residence as the place of occurrence of the external cause: Secondary | ICD-10-CM

## 2022-11-30 DIAGNOSIS — I2510 Atherosclerotic heart disease of native coronary artery without angina pectoris: Secondary | ICD-10-CM | POA: Diagnosis present

## 2022-11-30 DIAGNOSIS — Z87891 Personal history of nicotine dependence: Secondary | ICD-10-CM | POA: Diagnosis not present

## 2022-11-30 DIAGNOSIS — W19XXXA Unspecified fall, initial encounter: Secondary | ICD-10-CM | POA: Diagnosis not present

## 2022-11-30 DIAGNOSIS — S7292XA Unspecified fracture of left femur, initial encounter for closed fracture: Secondary | ICD-10-CM | POA: Diagnosis present

## 2022-11-30 DIAGNOSIS — M25552 Pain in left hip: Secondary | ICD-10-CM | POA: Diagnosis not present

## 2022-11-30 DIAGNOSIS — I160 Hypertensive urgency: Secondary | ICD-10-CM

## 2022-11-30 DIAGNOSIS — I739 Peripheral vascular disease, unspecified: Secondary | ICD-10-CM

## 2022-11-30 DIAGNOSIS — N183 Chronic kidney disease, stage 3 unspecified: Secondary | ICD-10-CM | POA: Diagnosis not present

## 2022-11-30 DIAGNOSIS — Z89612 Acquired absence of left leg above knee: Secondary | ICD-10-CM | POA: Diagnosis not present

## 2022-11-30 DIAGNOSIS — B9620 Unspecified Escherichia coli [E. coli] as the cause of diseases classified elsewhere: Secondary | ICD-10-CM | POA: Diagnosis not present

## 2022-11-30 DIAGNOSIS — S72109A Unspecified trochanteric fracture of unspecified femur, initial encounter for closed fracture: Secondary | ICD-10-CM | POA: Diagnosis not present

## 2022-11-30 DIAGNOSIS — I482 Chronic atrial fibrillation, unspecified: Secondary | ICD-10-CM | POA: Diagnosis not present

## 2022-11-30 DIAGNOSIS — Z8042 Family history of malignant neoplasm of prostate: Secondary | ICD-10-CM

## 2022-11-30 DIAGNOSIS — E1151 Type 2 diabetes mellitus with diabetic peripheral angiopathy without gangrene: Secondary | ICD-10-CM | POA: Diagnosis not present

## 2022-11-30 DIAGNOSIS — N189 Chronic kidney disease, unspecified: Secondary | ICD-10-CM | POA: Diagnosis not present

## 2022-11-30 DIAGNOSIS — S72112D Displaced fracture of greater trochanter of left femur, subsequent encounter for closed fracture with routine healing: Secondary | ICD-10-CM | POA: Diagnosis not present

## 2022-11-30 DIAGNOSIS — H9190 Unspecified hearing loss, unspecified ear: Secondary | ICD-10-CM | POA: Diagnosis present

## 2022-11-30 DIAGNOSIS — Z823 Family history of stroke: Secondary | ICD-10-CM

## 2022-11-30 DIAGNOSIS — Z961 Presence of intraocular lens: Secondary | ICD-10-CM | POA: Diagnosis present

## 2022-11-30 DIAGNOSIS — S72115A Nondisplaced fracture of greater trochanter of left femur, initial encounter for closed fracture: Secondary | ICD-10-CM | POA: Diagnosis not present

## 2022-11-30 DIAGNOSIS — S72102D Unspecified trochanteric fracture of left femur, subsequent encounter for closed fracture with routine healing: Secondary | ICD-10-CM | POA: Diagnosis not present

## 2022-11-30 DIAGNOSIS — N390 Urinary tract infection, site not specified: Secondary | ICD-10-CM | POA: Diagnosis present

## 2022-11-30 DIAGNOSIS — Z79899 Other long term (current) drug therapy: Secondary | ICD-10-CM | POA: Diagnosis not present

## 2022-11-30 DIAGNOSIS — R001 Bradycardia, unspecified: Secondary | ICD-10-CM | POA: Diagnosis not present

## 2022-11-30 DIAGNOSIS — Z888 Allergy status to other drugs, medicaments and biological substances status: Secondary | ICD-10-CM | POA: Diagnosis not present

## 2022-11-30 DIAGNOSIS — E119 Type 2 diabetes mellitus without complications: Secondary | ICD-10-CM

## 2022-11-30 DIAGNOSIS — Z8249 Family history of ischemic heart disease and other diseases of the circulatory system: Secondary | ICD-10-CM

## 2022-11-30 DIAGNOSIS — D631 Anemia in chronic kidney disease: Secondary | ICD-10-CM | POA: Diagnosis present

## 2022-11-30 DIAGNOSIS — N1832 Chronic kidney disease, stage 3b: Secondary | ICD-10-CM | POA: Diagnosis not present

## 2022-11-30 DIAGNOSIS — Z7901 Long term (current) use of anticoagulants: Secondary | ICD-10-CM | POA: Diagnosis not present

## 2022-11-30 DIAGNOSIS — F4320 Adjustment disorder, unspecified: Secondary | ICD-10-CM | POA: Diagnosis not present

## 2022-11-30 DIAGNOSIS — G4733 Obstructive sleep apnea (adult) (pediatric): Secondary | ICD-10-CM | POA: Diagnosis not present

## 2022-11-30 DIAGNOSIS — R52 Pain, unspecified: Secondary | ICD-10-CM | POA: Diagnosis not present

## 2022-11-30 DIAGNOSIS — M160 Bilateral primary osteoarthritis of hip: Secondary | ICD-10-CM | POA: Diagnosis present

## 2022-11-30 DIAGNOSIS — S72112S Displaced fracture of greater trochanter of left femur, sequela: Secondary | ICD-10-CM | POA: Diagnosis not present

## 2022-11-30 DIAGNOSIS — Z951 Presence of aortocoronary bypass graft: Secondary | ICD-10-CM | POA: Diagnosis not present

## 2022-11-30 DIAGNOSIS — B965 Pseudomonas (aeruginosa) (mallei) (pseudomallei) as the cause of diseases classified elsewhere: Secondary | ICD-10-CM | POA: Diagnosis not present

## 2022-11-30 DIAGNOSIS — Z803 Family history of malignant neoplasm of breast: Secondary | ICD-10-CM

## 2022-11-30 DIAGNOSIS — Z9841 Cataract extraction status, right eye: Secondary | ICD-10-CM

## 2022-11-30 DIAGNOSIS — K5900 Constipation, unspecified: Secondary | ICD-10-CM | POA: Diagnosis not present

## 2022-11-30 DIAGNOSIS — S72112A Displaced fracture of greater trochanter of left femur, initial encounter for closed fracture: Secondary | ICD-10-CM | POA: Diagnosis not present

## 2022-11-30 DIAGNOSIS — R339 Retention of urine, unspecified: Secondary | ICD-10-CM | POA: Diagnosis not present

## 2022-11-30 DIAGNOSIS — I959 Hypotension, unspecified: Secondary | ICD-10-CM | POA: Diagnosis not present

## 2022-11-30 DIAGNOSIS — A499 Bacterial infection, unspecified: Secondary | ICD-10-CM | POA: Diagnosis not present

## 2022-11-30 DIAGNOSIS — D6959 Other secondary thrombocytopenia: Secondary | ICD-10-CM | POA: Diagnosis not present

## 2022-11-30 DIAGNOSIS — R319 Hematuria, unspecified: Secondary | ICD-10-CM | POA: Diagnosis not present

## 2022-11-30 DIAGNOSIS — Z9842 Cataract extraction status, left eye: Secondary | ICD-10-CM

## 2022-11-30 DIAGNOSIS — I129 Hypertensive chronic kidney disease with stage 1 through stage 4 chronic kidney disease, or unspecified chronic kidney disease: Secondary | ICD-10-CM | POA: Diagnosis not present

## 2022-11-30 DIAGNOSIS — N138 Other obstructive and reflux uropathy: Secondary | ICD-10-CM

## 2022-11-30 DIAGNOSIS — D649 Anemia, unspecified: Secondary | ICD-10-CM | POA: Diagnosis not present

## 2022-11-30 LAB — CBC WITH DIFFERENTIAL/PLATELET
Abs Immature Granulocytes: 0.04 10*3/uL (ref 0.00–0.07)
Basophils Absolute: 0.1 10*3/uL (ref 0.0–0.1)
Basophils Relative: 1 %
Eosinophils Absolute: 0.2 10*3/uL (ref 0.0–0.5)
Eosinophils Relative: 3 %
HCT: 34 % — ABNORMAL LOW (ref 39.0–52.0)
Hemoglobin: 10.9 g/dL — ABNORMAL LOW (ref 13.0–17.0)
Immature Granulocytes: 0 %
Lymphocytes Relative: 29 %
Lymphs Abs: 2.8 10*3/uL (ref 0.7–4.0)
MCH: 29.8 pg (ref 26.0–34.0)
MCHC: 32.1 g/dL (ref 30.0–36.0)
MCV: 92.9 fL (ref 80.0–100.0)
Monocytes Absolute: 0.8 10*3/uL (ref 0.1–1.0)
Monocytes Relative: 9 %
Neutro Abs: 5.5 10*3/uL (ref 1.7–7.7)
Neutrophils Relative %: 58 %
Platelets: 178 10*3/uL (ref 150–400)
RBC: 3.66 MIL/uL — ABNORMAL LOW (ref 4.22–5.81)
RDW: 16.4 % — ABNORMAL HIGH (ref 11.5–15.5)
WBC: 9.4 10*3/uL (ref 4.0–10.5)
nRBC: 0 % (ref 0.0–0.2)

## 2022-11-30 LAB — BASIC METABOLIC PANEL
Anion gap: 9 (ref 5–15)
BUN: 33 mg/dL — ABNORMAL HIGH (ref 8–23)
CO2: 27 mmol/L (ref 22–32)
Calcium: 8.9 mg/dL (ref 8.9–10.3)
Chloride: 102 mmol/L (ref 98–111)
Creatinine, Ser: 1.87 mg/dL — ABNORMAL HIGH (ref 0.61–1.24)
GFR, Estimated: 33 mL/min — ABNORMAL LOW (ref >60)
Glucose, Bld: 124 mg/dL — ABNORMAL HIGH (ref 70–99)
Potassium: 3.7 mmol/L (ref 3.5–5.1)
Sodium: 138 mmol/L (ref 135–145)

## 2022-11-30 MED ORDER — MORPHINE SULFATE (PF) 4 MG/ML IV SOLN
6.0000 mg | Freq: Once | INTRAVENOUS | Status: AC
  Administered 2022-11-30: 6 mg via INTRAVENOUS
  Filled 2022-11-30: qty 2

## 2022-11-30 MED ORDER — AMLODIPINE BESYLATE 10 MG PO TABS
10.0000 mg | ORAL_TABLET | Freq: Every day | ORAL | 1 refills | Status: DC
Start: 2022-11-30 — End: 2023-06-08

## 2022-11-30 MED ORDER — TAMSULOSIN HCL 0.4 MG PO CAPS
0.4000 mg | ORAL_CAPSULE | Freq: Every day | ORAL | 1 refills | Status: DC
Start: 2022-11-30 — End: 2022-12-18

## 2022-11-30 MED ORDER — ROSUVASTATIN CALCIUM 40 MG PO TABS
40.0000 mg | ORAL_TABLET | Freq: Every day | ORAL | 1 refills | Status: DC
Start: 2022-11-30 — End: 2023-06-08

## 2022-11-30 MED ORDER — PENTOXIFYLLINE ER 400 MG PO TBCR
EXTENDED_RELEASE_TABLET | ORAL | 1 refills | Status: DC
Start: 2022-11-30 — End: 2023-06-15

## 2022-11-30 MED ORDER — LACTATED RINGERS IV SOLN: INTRAVENOUS | Status: DC

## 2022-11-30 NOTE — Consult Note (Signed)
89 y/o male s/p L BKA in the remote past has a non displaced fracture of the left hip greater troch.  Full consult note to follow.  Please hold blood thinners.  Pt may eat.

## 2022-11-30 NOTE — ED Provider Notes (Signed)
[place] EMERGENCY DEPARTMENT AT [facility] Provider Note   CSN: [id number] Arrival date & time: 11/30/22  1930     History  Chief Complaint  Patient presents with   Hip Injury    Eric [name]. is a 89 y.o. male.  89 year old male who is here with mechanical fall.  Patient states he lowered himself to the ground onto his left hip.  Patient takes Eliquis but did not strike his head.  Did not have any loss of consciousness.  Only complains of pain to his left hip.  Does have a history of a left above-the-knee amputation and does use a prosthesis.  Pain is characterized as sharp and worse with movement.  Presents via EMS       Home Medications Prior to Admission medications   Medication Sig Start Date End Date Taking? Authorizing Provider  acetaminophen (TYLENOL) 325 MG tablet Take 2 tablets (650 mg total) by mouth every 6 (six) hours as needed for mild pain (or temp >/= 101 F). 12/21/17   Alford Highland, MD  amLODipine (NORVASC) 10 MG tablet Take 1 tablet (10 mg total) by mouth daily. 11/30/22   [name], MD  apixaban (ELIQUIS) 2.5 MG TABS tablet Take 1 tablet (2.5 mg total) by mouth 2 (two) times daily. OFFICE VISIT NEEDED FOR ADDITIONAL REFILLS 10/02/22   [name], MD  atenolol (TENORMIN) 25 MG tablet Take 1 tablet by mouth once daily 06/26/22   [name], MD  ferrous sulfate 325 (65 FE) MG EC tablet Take 325 mg by mouth at bedtime.    [provider]  finasteride (PROSCAR) 5 MG tablet TAKE 1 TABLET BY MOUTH ONCE DAILY 03/28/18   [name], MD  furosemide (LASIX) 40 MG tablet Take 1 tablet (40 mg total) by mouth daily. 10/06/22   Pricilla Riffle, MD  hydrALAZINE (APRESOLINE) 25 MG tablet Take 3 tablets (75 mg total) by mouth in the morning and at bedtime. 09/28/22   Pricilla Riffle, MD  Multiple Vitamin (DAILY MULTIVITAMIN PO) Take 1 tablet by mouth daily.    [provider]  [facility] ULTRA test strip USE 1 STRIP TO  CHECK GLUCOSE ONCE DAILY 07/03/20   [name], MD  pentoxifylline (TRENTAL) 400 MG CR tablet TAKE 1 TABLET BY MOUTH EVERY 12 HOURS . 11/30/22   [name], MD  rosuvastatin (CRESTOR) 40 MG tablet Take 1 tablet (40 mg total) by mouth daily. 11/30/22   [name], MD  tamsulosin (FLOMAX) 0.4 MG CAPS capsule Take 1 capsule (0.4 mg total) by mouth daily. 11/30/22   [name], MD      Allergies    Isosorbide mononitrate, Ramipril, Quinidine gluconate, and Quinidine gluconate    Review of Systems   Review of Systems  All other systems reviewed and are negative.   Physical Exam Updated Vital Signs Pulse 67   Temp 98.3 F (36.8 C) (Oral)   Resp 16   SpO2 97%  Physical Exam Vitals and nursing note reviewed.  Constitutional:      General: He is not in acute distress.    Appearance: Normal appearance. He is well-developed. He is not toxic-appearing.  HENT:     Head: Normocephalic and atraumatic.  Eyes:     General: Lids are normal.     Conjunctiva/sclera: Conjunctivae normal.     Pupils: Pupils are equal, round, and reactive to light.  Neck:     Thyroid: No  thyroid mass.     Trachea: No tracheal deviation.  Cardiovascular:     Rate and Rhythm: Normal rate and regular rhythm.     Heart sounds: Normal heart sounds. No murmur heard.    No gallop.  Pulmonary:     Effort: Pulmonary effort is normal. No respiratory distress.     Breath sounds: Normal breath sounds. No stridor. No decreased breath sounds, wheezing, rhonchi or rales.  Abdominal:     General: There is no distension.     Palpations: Abdomen is soft.     Tenderness: There is no abdominal tenderness. There is no rebound.  Musculoskeletal:     Cervical back: Normal range of motion and neck supple.     Left hip: Tenderness and bony tenderness present. Decreased range of motion.       Legs:  Skin:    General: Skin is warm and dry.     Findings: No abrasion or rash.  Neurological:     Mental  Status: He is alert and oriented to person, place, and time. Mental status is at baseline.     GCS: GCS eye subscore is 4. GCS verbal subscore is 5. GCS motor subscore is 6.     Cranial Nerves: No cranial nerve deficit.     Sensory: No sensory deficit.     Motor: Motor function is intact.  Psychiatric:        Attention and Perception: Attention normal.        Speech: Speech normal.        Behavior: Behavior normal.     ED Results / Procedures / Treatments   Labs (all labs ordered are listed, but only abnormal results are displayed) Labs Reviewed - No data to display  EKG None  Radiology No results found.  Procedures Procedures    Medications Ordered in ED Medications - No data to display  ED Course/ Medical Decision Making/ A&P                             Medical Decision Making Amount and/or Complexity of Data Reviewed Labs: ordered. Radiology: ordered.  Risk Prescription drug management.   Patient medicated for pain here.  X-ray of patient's left hip shows positive fracture at the left greater trochanter.  Discussed with Dr. Victorino Dike from orthopedics and he will see the patient consultation.  Will admit to the medicine service        Final Clinical Impression(s) / ED Diagnoses Final diagnoses:  None    Rx / DC Orders ED Discharge Orders     None         Lorre Nick, MD 11/30/22 2110

## 2022-11-30 NOTE — ED Triage Notes (Signed)
Pt to ED by EMS from home with c/o L hip pain. Pt stood up from a seated position when he felt a pop causing him to stumble forward. Denies any trauma or LOC. Pt is taking eliquis. Arrives with a sheet binder in place. Pt has a L sided BKA. Arrives A+O, VSS, NADN.

## 2022-11-30 NOTE — H&P (Signed)
History and Physical    Patient: Eric [name]. ZOX:[id number] DOB: 06-02-1928 DOA: 11/30/2022 DOS: the patient was seen and examined on 11/30/2022 PCP: [name], MD  Patient coming from: Home  Chief Complaint:  Chief Complaint  Patient presents with   Hip Injury   HPI: Eric [name]. is a 89 y.o. male with medical history significant of diet-controlled diabetes, coronary artery disease, essential hypertension, obstructive sleep apnea, peripheral vascular disease status post BKA on the left, essential hypertension, atrial fibrillation, who sustained mechanical fall at home and came Left hip pain.  Patient evaluated in the ER and found to have nondisplaced fracture of the left greater trochanter.  Patient is on Eliquis for atrial fibrillation.  Orthopedic consulted Dr. Victorino Dike.  Will evaluate patient.  Patient unfortunately because of the Eliquis cannot get surgery immediately.  Patient being admitted to the medical service pending surgery.  Review of Systems: As mentioned in the history of present illness. All other systems reviewed and are negative. Past Medical History:  Diagnosis Date   Atrial flutter    s/p RFCA   CAD (coronary artery disease)    cath 2003, occluded S-RCA, occluded S-Dx, L-LAD ok, s/p PTCA to LAD   Cataract    CKD (chronic kidney disease) stage 3, GFR 30-59 ml/min    Diabetes mellitus    diet controlled   Hearing aid worn    bilateral   History of kidney stones    Long term (current) use of anticoagulants    Neuromuscular disorder    OSA (obstructive sleep apnea) 12/11   very mild, AHI 7/hr   Persistent atrial fibrillation 09/26/2015   Pure hyperglyceridemia    PVD (peripheral vascular disease)    angioplasty of his right lower extremity in [place] by Dr.[name] 2013   Unspecified essential hypertension    Wears dentures    full upper and lower   Past Surgical History:  Procedure Laterality Date   ABDOMINAL AORTOGRAM W/LOWER EXTREMITY  N/A 11/15/2017   Procedure: ABDOMINAL AORTOGRAM W/LOWER EXTREMITY;  Surgeon: Sherren Kerns, MD;  [facility];  Service: Cardiovascular;  Laterality: N/A;   Adenosine Myoview  3/06   EF 56%, neg. Ischemia   Adenosine Myoview  02/18/07   nml   AMPUTATION Left 12/15/2017   Procedure: AMPUTATION BELOW KNEE;  Surgeon: Annice Needy, MD;  Location: [facility] ORS;  Service: Vascular;  Laterality: Left;   ANGIOPLASTY  1/99   CAD- diogonal with rotational artherectomy   Arthrectomy     of LAD & PTCA   BLEPHAROPLASTY Bilateral    CARDIAC CATHETERIZATION  1/00   CARDIOVERSION  1/04   CARDIOVERSION  5/07   hospital- a flutter   CARPAL TUNNEL RELEASE     ? bilateral   CATARACT EXTRACTION W/PHACO Right 07/28/2017   Procedure: CATARACT EXTRACTION PHACO AND INTRAOCULAR LENS PLACEMENT (IOC) RIGHT DIABETIC;  Surgeon: Lockie Mola, MD;  Location: [facility];  Service: Ophthalmology;  Laterality: Right;  Diabetic - diet controlled   CATARACT EXTRACTION W/PHACO Left 08/18/2017   Procedure: CATARACT EXTRACTION PHACO AND INTRAOCULAR LENS PLACEMENT (IOC);  Surgeon: Lockie Mola, MD;  Location: [facility];  Service: Ophthalmology;  Laterality: Left;  DIABETES - oral meds   COLONOSCOPY W/ BIOPSIES  10/01/06   sigmoid polyp bx neg, 3 years   COLONOSCOPY WITH PROPOFOL N/A 04/28/2021   Procedure: COLONOSCOPY WITH PROPOFOL;  Surgeon: Iva Boop, MD;  Location: [facility] ENDOSCOPY;  Service: Endoscopy;  Laterality:  N/A;   CORONARY ANGIOPLASTY  4/03   cutting balloon PTCA pLAD into Diag   CORONARY ARTERY BYPASS GRAFT  2000   LIMA-LAD, SVG-RCA, SVG-Diag; SVG-Diag & SVG-RCA occluded 2003   FRACTURE SURGERY     HAND SURGERY  08/27/09   R thumb procedure wit Scaphoid Gragt and screws, Dr Merlyn Lot   LOWER EXTREMITY ANGIOGRAPHY Right 01/25/2019   Procedure: LOWER EXTREMITY ANGIOGRAPHY;  Surgeon: Renford Dills, MD;  Location: [facility];  Service: Cardiovascular;   Laterality: Right;   NM MYOVIEW LTD  4/11   normal   PERIPHERAL VASCULAR CATHETERIZATION Right 06/27/2015   Procedure: Lower Extremity Angiography;  Surgeon: Annice Needy, MD;  Location: [facility];  Service: Cardiovascular;  Laterality: Right;   PERIPHERAL VASCULAR CATHETERIZATION  06/27/2015   Procedure: Lower Extremity Intervention;  Surgeon: Annice Needy, MD;  Location: [facility];  Service: Cardiovascular;;   POLYPECTOMY  04/28/2021   Procedure: POLYPECTOMY;  Surgeon: Iva Boop, MD;  Location: [facility] ENDOSCOPY;  Service: Endoscopy;;   Social History:  reports that he has never smoked. He quit smokeless tobacco use about 30 years ago. He reports that he does not drink alcohol and does not use drugs.  Allergies  Allergen Reactions   Isosorbide Mononitrate Other (See Comments)    Unknown, doesn't remember    Ramipril Cough and Other (See Comments)   Quinidine Gluconate Rash   Quinidine Gluconate Other (See Comments) and Rash    Family History  Problem Relation Age of Onset   Stroke Father    Aneurysm Father    Hypertension Father    Prostate cancer Brother    Hypertension Mother    Lung cancer Brother        smoker   Thrombosis Brother    Other Brother        RF valve disorder (smoker)   Lung cancer Brother        smoker   Breast cancer Sister    Liver cancer Brother    Other Sister        cerebral hemorrhage    Prior to Admission medications   Medication Sig Start Date End Date Taking? Authorizing Provider  acetaminophen (TYLENOL) 325 MG tablet Take 2 tablets (650 mg total) by mouth every 6 (six) hours as needed for mild pain (or temp >/= 101 F). 12/21/17   Alford Highland, MD  amLODipine (NORVASC) 10 MG tablet Take 1 tablet (10 mg total) by mouth daily. 11/30/22   [name], MD  apixaban (ELIQUIS) 2.5 MG TABS tablet Take 1 tablet (2.5 mg total) by mouth 2 (two) times daily. OFFICE VISIT NEEDED FOR ADDITIONAL REFILLS 10/02/22   [name], MD  atenolol (TENORMIN) 25 MG tablet Take 1 tablet by mouth once daily 06/26/22   [name], MD  ferrous sulfate 325 (65 FE) MG EC tablet Take 325 mg by mouth at bedtime.    [provider]  finasteride (PROSCAR) 5 MG tablet TAKE 1 TABLET BY MOUTH ONCE DAILY 03/28/18   [name], MD  furosemide (LASIX) 40 MG tablet Take 1 tablet (40 mg total) by mouth daily. 10/06/22   Pricilla Riffle, MD  hydrALAZINE (APRESOLINE) 25 MG tablet Take 3 tablets (75 mg total) by mouth in the morning and at bedtime. 09/28/22   Pricilla Riffle, MD  Multiple Vitamin (DAILY MULTIVITAMIN PO) Take 1 tablet by mouth daily.    [provider]  [employer]  ULTRA test strip USE 1 STRIP TO CHECK GLUCOSE ONCE DAILY 07/03/20   [name], MD  pentoxifylline (TRENTAL) 400 MG CR tablet TAKE 1 TABLET BY MOUTH EVERY 12 HOURS . 11/30/22   [name], MD  rosuvastatin (CRESTOR) 40 MG tablet Take 1 tablet (40 mg total) by mouth daily. 11/30/22   [name], MD  tamsulosin (FLOMAX) 0.4 MG CAPS capsule Take 1 capsule (0.4 mg total) by mouth daily. 11/30/22   [name], MD    Physical Exam: Vitals:   11/30/22 1935 11/30/22 2000 11/30/22 2200  BP:  (!) 150/62 136/74  Pulse: 67 65 65  Resp: 16 16 16   Temp: 98.3 F (36.8 C)    TempSrc: Oral    SpO2: 97% 94% 94%   Constitutional: Acutely ill looking no distress NAD, calm, comfortable Eyes: PERRL, lids and conjunctivae normal ENMT: Mucous membranes are moist. Posterior pharynx clear of any exudate or lesions.Normal dentition.  Neck: normal, supple, no masses, no thyromegaly Respiratory: clear to auscultation bilaterally, no wheezing, no crackles. Normal respiratory effort. No accessory muscle use.  Cardiovascular: Regular rate and rhythm, no murmurs / rubs / gallops. No extremity edema. 2+ pedal pulses. No carotid bruits.  Abdomen: no tenderness, no masses palpated. No hepatosplenomegaly. Bowel sounds positive.  Musculoskeletal:  Status post left BKA, left hip laterally rotated,  skin: no rashes, lesions, ulcers. No induration Neurologic: CN 2-12 grossly intact. Sensation intact, DTR normal. Strength 5/5 in all 4.  Psychiatric: Normal judgment and insight. Alert and oriented x 3. Normal mood  Data Reviewed:  White count 9.4 hemoglobin 10.9, platelets 178, sodium 138 potassium 3.7 chloride 102 CO2 27, BUN 33 creatinine 1.87.  Glucose 124.  X-ray of the hip showed minimally displaced fracture through the left femoral greater trochanter no femoral neck involvement.  Assessment and Plan:  #1 left greater trochanter fracture: Patient will be admitted.  Pain control, holding Eliquis.  Orthopedics to see patient for planned surgery.  At this point appears stable.  We will monitor closely.  #2 status post fall: Patient will need extensive PT and OT.  More than likely skilled placement.  He did not hit his head after the fall.  #3 diet-controlled diabetes: Last recorded A1c was 5.8.  Sliding scale insulin.  #4 controlled atrial fibrillation: Holding anticoagulation.  Continue amlodipine and atenolol.  #5 acute on chronic kidney disease stage III: Hydrate and monitor.  Hold Lasix.  #6 essential hypertension: Continue amlodipine and atenolol.  #7 peripheral vascular disease: On [place].  Temporarily holding.  #8 hyperlipidemia: Continue Crestor    Advance Care Planning:   Code Status: Prior full code  Consults: Dr. Victorino Dike, orthopedics  Family Communication: No family at bedside  Severity of Illness: The appropriate patient status for this patient is INPATIENT. Inpatient status is judged to be reasonable and necessary in order to provide the required intensity of service to ensure the patient's safety. The patient's presenting symptoms, physical exam findings, and initial radiographic and laboratory data in the context of their chronic comorbidities is felt to place them at high risk for further clinical deterioration.  Furthermore, it is not anticipated that the patient will be medically stable for discharge from the hospital within 2 midnights of admission.   * I certify that at the point of admission it is my clinical judgment that the patient will require inpatient hospital care spanning beyond 2 midnights from the point of admission due to high intensity of service, high risk for  further deterioration and high frequency of surveillance required.*  AuthorLonia Blood, MD 11/30/2022 10:50 PM  For on call review [email or web address].

## 2022-11-30 NOTE — Telephone Encounter (Signed)
Prescription Request  11/30/2022  LOV: 11/16/2022  What is the name of the medication or equipment?   pentoxifylline (TRENTAL) 400 MG CR tablet   rosuvastatin (CRESTOR) 40 MG tablet   tamsulosin (FLOMAX) 0.4 MG CAPS capsule    amLODipine (NORVASC) 10 MG tablet   **PATIENT HAS BEEN OUT OF MEDICATION FOR ABOUT 1 WEEK; UNSUCCESSFUL IN GETTING PHARMACY TO GIVE HIM REFILLS**  Have you contacted your pharmacy to request a refill? Yes   Which pharmacy would you like this sent to?   [facility] [address] [place] - [address] [place] [address] Phone: [phone]  Fax: [phone]   Patient notified that their request is being sent to the clinical staff for review and that they should receive a response within 2 business days.   Please advise patient when refills sent to pharmacy at [phone].

## 2022-12-01 ENCOUNTER — Inpatient Hospital Stay (HOSPITAL_COMMUNITY): Payer: PPO

## 2022-12-01 DIAGNOSIS — Z89512 Acquired absence of left leg below knee: Secondary | ICD-10-CM

## 2022-12-01 DIAGNOSIS — S72115A Nondisplaced fracture of greater trochanter of left femur, initial encounter for closed fracture: Secondary | ICD-10-CM | POA: Diagnosis not present

## 2022-12-01 DIAGNOSIS — I10 Essential (primary) hypertension: Secondary | ICD-10-CM | POA: Diagnosis not present

## 2022-12-01 DIAGNOSIS — N179 Acute kidney failure, unspecified: Secondary | ICD-10-CM | POA: Diagnosis not present

## 2022-12-01 DIAGNOSIS — Z7901 Long term (current) use of anticoagulants: Secondary | ICD-10-CM

## 2022-12-01 LAB — COMPREHENSIVE METABOLIC PANEL
ALT: 20 U/L (ref 0–44)
AST: 24 U/L (ref 15–41)
Albumin: 2.7 g/dL — ABNORMAL LOW (ref 3.5–5.0)
Alkaline Phosphatase: 44 U/L (ref 38–126)
Anion gap: 7 (ref 5–15)
BUN: 30 mg/dL — ABNORMAL HIGH (ref 8–23)
CO2: 25 mmol/L (ref 22–32)
Calcium: 8.6 mg/dL — ABNORMAL LOW (ref 8.9–10.3)
Chloride: 107 mmol/L (ref 98–111)
Creatinine, Ser: 1.6 mg/dL — ABNORMAL HIGH (ref 0.61–1.24)
GFR, Estimated: 40 mL/min — ABNORMAL LOW (ref >60)
Glucose, Bld: 80 mg/dL (ref 70–99)
Potassium: 3.7 mmol/L (ref 3.5–5.1)
Sodium: 139 mmol/L (ref 135–145)
Total Bilirubin: 0.9 mg/dL (ref 0.3–1.2)
Total Protein: 5.8 g/dL — ABNORMAL LOW (ref 6.5–8.1)

## 2022-12-01 LAB — URINALYSIS, ROUTINE W REFLEX MICROSCOPIC
Bilirubin Urine: NEGATIVE
Glucose, UA: NEGATIVE mg/dL
Ketones, ur: NEGATIVE mg/dL
Nitrite: NEGATIVE
Protein, ur: 100 mg/dL — AB
Specific Gravity, Urine: 1.014 (ref 1.005–1.030)
WBC, UA: >50 WBC/hpf (ref 0–5)
pH: 5 (ref 5.0–8.0)

## 2022-12-01 LAB — CBC
HCT: 32.7 % — ABNORMAL LOW (ref 39.0–52.0)
Hemoglobin: 10.2 g/dL — ABNORMAL LOW (ref 13.0–17.0)
MCH: 29.1 pg (ref 26.0–34.0)
MCHC: 31.2 g/dL (ref 30.0–36.0)
MCV: 93.2 fL (ref 80.0–100.0)
Platelets: 158 10*3/uL (ref 150–400)
RBC: 3.51 MIL/uL — ABNORMAL LOW (ref 4.22–5.81)
RDW: 16.5 % — ABNORMAL HIGH (ref 11.5–15.5)
WBC: 11.1 10*3/uL — ABNORMAL HIGH (ref 4.0–10.5)
nRBC: 0 % (ref 0.0–0.2)

## 2022-12-01 LAB — GLUCOSE, CAPILLARY
Glucose-Capillary: 133 mg/dL — ABNORMAL HIGH (ref 70–99)
Glucose-Capillary: 133 mg/dL — ABNORMAL HIGH (ref 70–99)

## 2022-12-01 LAB — CBG MONITORING, ED
Glucose-Capillary: 90 mg/dL (ref 70–99)
Glucose-Capillary: 98 mg/dL (ref 70–99)

## 2022-12-01 MED ORDER — AMLODIPINE BESYLATE 10 MG PO TABS
10.0000 mg | ORAL_TABLET | Freq: Every day | ORAL | Status: DC
  Administered 2022-12-01 – 2022-12-04 (×3): 10 mg via ORAL
  Filled 2022-12-01: qty 1
  Filled 2022-12-01: qty 2
  Filled 2022-12-01: qty 1

## 2022-12-01 MED ORDER — CHLORHEXIDINE GLUCONATE CLOTH 2 % EX PADS
6.0000 | MEDICATED_PAD | Freq: Every day | CUTANEOUS | Status: DC
  Administered 2022-12-01: 6 via TOPICAL

## 2022-12-01 MED ORDER — LACTATED RINGERS IV SOLN: INTRAVENOUS | Status: DC

## 2022-12-01 MED ORDER — INSULIN ASPART 100 UNIT/ML IJ SOLN
0.0000 [IU] | Freq: Three times a day (TID) | INTRAMUSCULAR | Status: DC
  Administered 2022-12-01 – 2022-12-02 (×2): 1 [IU] via SUBCUTANEOUS

## 2022-12-01 MED ORDER — HYDRALAZINE HCL 50 MG PO TABS
75.0000 mg | ORAL_TABLET | Freq: Two times a day (BID) | ORAL | Status: DC
  Administered 2022-12-01 – 2022-12-04 (×6): 75 mg via ORAL
  Filled 2022-12-01 (×6): qty 1

## 2022-12-01 MED ORDER — MORPHINE SULFATE (PF) 2 MG/ML IV SOLN
2.0000 mg | INTRAVENOUS | Status: DC | PRN
  Administered 2022-12-01 – 2022-12-04 (×9): 2 mg via INTRAVENOUS
  Filled 2022-12-01 (×9): qty 1

## 2022-12-01 MED ORDER — ATENOLOL 25 MG PO TABS
25.0000 mg | ORAL_TABLET | Freq: Every day | ORAL | Status: DC
  Administered 2022-12-01 – 2022-12-04 (×3): 25 mg via ORAL
  Filled 2022-12-01 (×3): qty 1

## 2022-12-01 MED ORDER — ONDANSETRON HCL 4 MG/2ML IJ SOLN
4.0000 mg | Freq: Four times a day (QID) | INTRAMUSCULAR | Status: DC | PRN
  Administered 2022-12-01: 4 mg via INTRAVENOUS
  Filled 2022-12-01: qty 2

## 2022-12-01 MED ORDER — INSULIN ASPART 100 UNIT/ML IJ SOLN: 0.0000 [IU] | Freq: Every day | INTRAMUSCULAR | Status: DC

## 2022-12-01 MED ORDER — ROSUVASTATIN CALCIUM 20 MG PO TABS
40.0000 mg | ORAL_TABLET | Freq: Every day | ORAL | Status: DC
  Administered 2022-12-01 – 2022-12-04 (×4): 40 mg via ORAL
  Filled 2022-12-01 (×4): qty 2

## 2022-12-01 MED ORDER — ONDANSETRON HCL 4 MG PO TABS: 4.0000 mg | ORAL_TABLET | Freq: Four times a day (QID) | ORAL | Status: DC | PRN

## 2022-12-01 MED ORDER — TAMSULOSIN HCL 0.4 MG PO CAPS
0.4000 mg | ORAL_CAPSULE | Freq: Every day | ORAL | Status: DC
  Administered 2022-12-01 – 2022-12-04 (×4): 0.4 mg via ORAL
  Filled 2022-12-01 (×4): qty 1

## 2022-12-01 MED ORDER — FINASTERIDE 5 MG PO TABS
5.0000 mg | ORAL_TABLET | Freq: Every day | ORAL | Status: DC
  Administered 2022-12-01 – 2022-12-04 (×4): 5 mg via ORAL
  Filled 2022-12-01 (×4): qty 1

## 2022-12-01 MED ORDER — ACETAMINOPHEN 325 MG PO TABS
650.0000 mg | ORAL_TABLET | Freq: Four times a day (QID) | ORAL | Status: DC | PRN
  Administered 2022-12-01 – 2022-12-04 (×4): 650 mg via ORAL
  Filled 2022-12-01 (×4): qty 2

## 2022-12-01 MED ORDER — FERROUS SULFATE 325 (65 FE) MG PO TABS
325.0000 mg | ORAL_TABLET | Freq: Every day | ORAL | Status: DC
  Administered 2022-12-01 – 2022-12-03 (×3): 325 mg via ORAL
  Filled 2022-12-01 (×3): qty 1

## 2022-12-01 NOTE — ED Notes (Signed)
Pt to CT

## 2022-12-01 NOTE — Consult Note (Addendum)
Reason for Consult:Left greater troch fx Referring Physician: Glade Lloyd Time called: 0730 Time at bedside: 0854   Eric [name]. is an 89 y.o. male.  HPI: Stclair was getting up from a chair when he had sudden left hip pain that caused him to fall. He did not fall hard as he was able to brace himself with his hands. However, he could not get up and was brought to the ED. X-rays showed a greater troch fx and orthopedic surgery was consulted. He lives at home with his wife and generally uses some sort of assistive device to ambulate.  Past Medical History:  Diagnosis Date   Atrial flutter    s/p RFCA   CAD (coronary artery disease)    cath 2003, occluded S-RCA, occluded S-Dx, L-LAD ok, s/p PTCA to LAD   Cataract    CKD (chronic kidney disease) stage 3, GFR 30-59 ml/min    Diabetes mellitus    diet controlled   Hearing aid worn    bilateral   History of kidney stones    Long term (current) use of anticoagulants    Neuromuscular disorder    OSA (obstructive sleep apnea) 12/11   very mild, AHI 7/hr   Persistent atrial fibrillation 09/26/2015   Pure hyperglyceridemia    PVD (peripheral vascular disease)    angioplasty of his right lower extremity in [place] by Dr.[name] 2013   Unspecified essential hypertension    Wears dentures    full upper and lower    Past Surgical History:  Procedure Laterality Date   ABDOMINAL AORTOGRAM W/LOWER EXTREMITY N/A 11/15/2017   Procedure: ABDOMINAL AORTOGRAM W/LOWER EXTREMITY;  Surgeon: Sherren Kerns, MD;  [facility];  Service: Cardiovascular;  Laterality: N/A;   Adenosine Myoview  3/06   EF 56%, neg. Ischemia   Adenosine Myoview  02/18/07   nml   AMPUTATION Left 12/15/2017   Procedure: AMPUTATION BELOW KNEE;  Surgeon: Annice Needy, MD;  Location: [facility] OR[facility];  Service: Vascular;  Laterality: Left;   ANGIOPLASTY  1/99   CAD- diogonal with rotational artherectomy   Arthrectomy     of LAD & PTCA   BLEPHAROPLASTY Bilateral     CARDIAC CATHETERIZATION  1/00   CARDIOVERSION  1/04   CARDIOVERSION  5/07   hospital- a flutter   CARPAL TUNNEL RELEASE     ? bilateral   CATARACT EXTRACTION W/PHACO Right 07/28/2017   Procedure: CATARACT EXTRACTION PHACO AND INTRAOCULAR LENS PLACEMENT (IOC) RIGHT DIABETIC;  Surgeon: Lockie Mola, MD;  Location: [facility];  Service: Ophthalmology;  Laterality: Right;  Diabetic - diet controlled   CATARACT EXTRACTION W/PHACO Left 08/18/2017   Procedure: CATARACT EXTRACTION PHACO AND INTRAOCULAR LENS PLACEMENT (IOC);  Surgeon: Lockie Mola, MD;  Location: [facility];  Service: Ophthalmology;  Laterality: Left;  DIABETES - oral meds   COLONOSCOPY W/ BIOPSIES  10/01/06   sigmoid polyp bx neg, 3 years   COLONOSCOPY WITH PROPOFOL N/A 04/28/2021   Procedure: COLONOSCOPY WITH PROPOFOL;  Surgeon: Iva Boop, MD;  Location: [facility] ENDOSCOPY;  Service: Endoscopy;  Laterality: N/A;   CORONARY ANGIOPLASTY  4/03   cutting balloon PTCA pLAD into Diag   CORONARY ARTERY BYPASS GRAFT  2000   LIMA-LAD, SVG-RCA, SVG-Diag; SVG-Diag & SVG-RCA occluded 2003   FRACTURE SURGERY     HAND SURGERY  08/27/09   R thumb procedure wit Scaphoid Gragt and screws, Dr Merlyn Lot   LOWER EXTREMITY ANGIOGRAPHY Right 01/25/2019   Procedure: LOWER  EXTREMITY ANGIOGRAPHY;  Surgeon: Renford Dills, MD;  Location: [facility];  Service: Cardiovascular;  Laterality: Right;   NM MYOVIEW LTD  4/11   normal   PERIPHERAL VASCULAR CATHETERIZATION Right 06/27/2015   Procedure: Lower Extremity Angiography;  Surgeon: Annice Needy, MD;  Location: [facility];  Service: Cardiovascular;  Laterality: Right;   PERIPHERAL VASCULAR CATHETERIZATION  06/27/2015   Procedure: Lower Extremity Intervention;  Surgeon: Annice Needy, MD;  Location: [facility];  Service: Cardiovascular;;   POLYPECTOMY  04/28/2021   Procedure: POLYPECTOMY;  Surgeon: Iva Boop, MD;  Location: [facility] ENDOSCOPY;   Service: Endoscopy;;    Family History  Problem Relation Age of Onset   Stroke Father    Aneurysm Father    Hypertension Father    Prostate cancer Brother    Hypertension Mother    Lung cancer Brother        smoker   Thrombosis Brother    Other Brother        RF valve disorder (smoker)   Lung cancer Brother        smoker   Breast cancer Sister    Liver cancer Brother    Other Sister        cerebral hemorrhage    Social History:  reports that he has never smoked. He quit smokeless tobacco use about 30 years ago. He reports that he does not drink alcohol and does not use drugs.  Allergies:  Allergies  Allergen Reactions   Isosorbide Mononitrate Other (See Comments)    Unknown, doesn't remember    Ramipril Cough and Other (See Comments)   Quinidine Gluconate Rash   Quinidine Gluconate Other (See Comments) and Rash    Medications: I have reviewed the patient's current medications.  Results for orders placed or performed during the hospital encounter of 11/30/22 (from the past 48 hour(s))  CBC with Differential/Platelet     Status: Abnormal   Collection Time: 11/30/22  8:50 PM  Result Value Ref Range   WBC 9.4 4.0 - 10.5 K/uL   RBC 3.66 (L) 4.22 - 5.81 MIL/uL   Hemoglobin 10.9 (L) 13.0 - 17.0 g/dL   HCT 16.1 (L) 09.6 - 04.5 %   MCV 92.9 80.0 - 100.0 fL   MCH 29.8 26.0 - 34.0 pg   MCHC 32.1 30.0 - 36.0 g/dL   RDW 40.9 (H) 81.1 - 91.4 %   Platelets 178 150 - 400 K/uL   nRBC 0.0 0.0 - 0.2 %   Neutrophils Relative % 58 %   Neutro Abs 5.5 1.7 - 7.7 K/uL   Lymphocytes Relative 29 %   Lymphs Abs 2.8 0.7 - 4.0 K/uL   Monocytes Relative 9 %   Monocytes Absolute 0.8 0.1 - 1.0 K/uL   Eosinophils Relative 3 %   Eosinophils Absolute 0.2 0.0 - 0.5 K/uL   Basophils Relative 1 %   Basophils Absolute 0.1 0.0 - 0.1 K/uL   Immature Granulocytes 0 %   Abs Immature Granulocytes 0.04 0.00 - 0.07 K/uL    Comment: Performed at [facility], [address], [place], [place]  [address]  Basic metabolic panel     Status: Abnormal   Collection Time: 11/30/22  8:50 PM  Result Value Ref Range   Sodium 138 135 - 145 mmol/L   Potassium 3.7 3.5 - 5.1 mmol/L   Chloride 102 98 - 111 mmol/L   CO2 27 22 - 32 mmol/L  Glucose, Bld 124 (H) 70 - 99 mg/dL    Comment: Glucose reference range applies only to samples taken after fasting for at least 8 hours.   BUN 33 (H) 8 - 23 mg/dL   Creatinine, Ser 9.60 (H) 0.61 - 1.24 mg/dL   Calcium 8.9 8.9 - 45.4 mg/dL   GFR, Estimated 33 (L) >60 mL/min    Comment: (NOTE) Calculated using the CKD-EPI Creatinine Equation (2021)    Anion gap 9 5 - 15    Comment: Performed at [facility], [address], [place], [place] [address]  CBC     Status: Abnormal   Collection Time: 12/01/22  6:45 AM  Result Value Ref Range   WBC 11.1 (H) 4.0 - 10.5 K/uL   RBC 3.51 (L) 4.22 - 5.81 MIL/uL   Hemoglobin 10.2 (L) 13.0 - 17.0 g/dL   HCT 91.4 (L) 78.2 - 95.6 %   MCV 93.2 80.0 - 100.0 fL   MCH 29.1 26.0 - 34.0 pg   MCHC 31.2 30.0 - 36.0 g/dL   RDW 21.3 (H) 08.6 - 57.8 %   Platelets 158 150 - 400 K/uL   nRBC 0.0 0.0 - 0.2 %    Comment: Performed at [facility], [address], [place], [place] [address]  Comprehensive metabolic panel     Status: Abnormal   Collection Time: 12/01/22  6:45 AM  Result Value Ref Range   Sodium 139 135 - 145 mmol/L   Potassium 3.7 3.5 - 5.1 mmol/L   Chloride 107 98 - 111 mmol/L   CO2 25 22 - 32 mmol/L   Glucose, Bld 80 70 - 99 mg/dL    Comment: Glucose reference range applies only to samples taken after fasting for at least 8 hours.   BUN 30 (H) 8 - 23 mg/dL   Creatinine, Ser 9.52 (H) 0.61 - 1.24 mg/dL   Calcium 8.6 (L) 8.9 - 10.3 mg/dL   Total Protein 5.8 (L) 6.5 - 8.1 g/dL   Albumin 2.7 (L) 3.5 - 5.0 g/dL   AST 24 15 - 41 U/L   ALT 20 0 - 44 U/L   Alkaline Phosphatase 44 38 - 126 U/L   Total Bilirubin 0.9 0.3 - 1.2 mg/dL   GFR, Estimated 40 (L) >60 mL/min    Comment: (NOTE) Calculated using  the CKD-EPI Creatinine Equation (2021)    Anion gap 7 5 - 15    Comment: Performed at [facility], [address], [place], [place] [address]  CBG monitoring, ED     Status: None   Collection Time: 12/01/22  8:07 AM  Result Value Ref Range   Glucose-Capillary 90 70 - 99 mg/dL    Comment: Glucose reference range applies only to samples taken after fasting for at least 8 hours.   *Note: Due to a large number of results and/or encounters for the requested time period, some results have not been displayed. A complete set of results can be found in Results Review.    DG Pelvis 1-2 Views  Result Date: 11/30/2022 CLINICAL DATA:  Fall, left hip pain EXAM: PELVIS - 1-2 VIEW COMPARISON:  Left femur series today FINDINGS: There is a minimally displaced fracture through the left greater trochanter. No visible involvement of the femoral neck. No subluxation or dislocation. Mild symmetric degenerative changes in the hips. IMPRESSION: Left greater trochanter fracture. No visible femoral neck involvement. Electronically Signed   By: Charlett Nose M.D.   On: 11/30/2022  20:37   DG Femur Min 2 Views Left  Result Date: 11/30/2022 CLINICAL DATA:  Fall, left hip pain EXAM: LEFT FEMUR 2 VIEWS COMPARISON:  CT abdomen and pelvis 06/15/2019 FINDINGS: Irregularity in the greater trochanter compatible with fracture. No visible fracture crossing the femoral neck. No subluxation or dislocation. Mild degenerative changes in the left hip and left knee. No joint effusion within the left knee. IMPRESSION: Minimally displaced fracture through the left femoral greater trochanter. No visible femoral neck involvement. Electronically Signed   By: Charlett Nose M.D.   On: 11/30/2022 20:36    Review of Systems  HENT:  Negative for ear discharge, ear pain, hearing loss and tinnitus.   Eyes:  Negative for photophobia and pain.  Respiratory:  Negative for cough and shortness of breath.   Cardiovascular:  Negative for chest pain.   Gastrointestinal:  Negative for abdominal pain, nausea and vomiting.  Genitourinary:  Negative for dysuria, flank pain, frequency and urgency.  Musculoskeletal:  Positive for arthralgias (Left hip). Negative for back pain, myalgias and neck pain.  Neurological:  Negative for dizziness and headaches.  Hematological:  Does not bruise/bleed easily.  Psychiatric/Behavioral:  The patient is not nervous/anxious.    Blood pressure (!) 142/54, pulse 62, temperature 99.1 F (37.3 C), temperature source Oral, resp. rate 16, SpO2 99 %. Physical Exam Constitutional:      General: He is not in acute distress.    Appearance: He is well-developed. He is not diaphoretic.  HENT:     Head: Normocephalic and atraumatic.  Eyes:     General: No scleral icterus.       Right eye: No discharge.        Left eye: No discharge.     Conjunctiva/sclera: Conjunctivae normal.  Cardiovascular:     Rate and Rhythm: Normal rate and regular rhythm.  Pulmonary:     Effort: Pulmonary effort is normal. No respiratory distress.  Musculoskeletal:     Cervical back: Normal range of motion.     Comments: LLE No traumatic wounds, ecchymosis, or rash  Mod TTP hip, s/p BKA  No knee effusion  Knee stable to varus/ valgus and anterior/posterior stress  Skin:    General: Skin is warm and dry.  Neurological:     Mental Status: He is alert.  Psychiatric:        Mood and Affect: Mood normal.        Behavior: Behavior normal.     Assessment/Plan: Left greater troch fx -- Will get CT to ensure this does not extend across troch. As long as it's confined to the greater troch he may be WBAT with no active hip abduction. F/u with Dr. Victorino Dike in 2-3 weeks.    Freeman Caldron, PA-C Orthopedic Surgery [phone] 12/01/2022, 9:04 AM   Pt seen and examined.  Agree with findings documented above.  CT scan shows a greater troch fracture that is displaced 1 cm.  There is no evident IT fracture, but an MRI was recommended.   The patient has substantial pain with any motion of the left hip.  I will order the MRI to eval for occult IT fracture.  The patient understands the plan and agrees.

## 2022-12-01 NOTE — Progress Notes (Signed)
Pt arrived to room 5N15 via bed from the ED. Received report from [place], Charity fundraiser. See assessment. Will continue to monitor.

## 2022-12-01 NOTE — Plan of Care (Signed)

## 2022-12-01 NOTE — ED Notes (Signed)
This NT drained pt urinary bag pt voided 1300cc

## 2022-12-01 NOTE — Progress Notes (Addendum)
PROGRESS NOTE    Eric [name].  ZOX:[id number] DOB: Sep 27, 1927 DOA: 11/30/2022 PCP: [name], MD   Brief Narrative:   89 y.o. male with medical history significant of diet-controlled diabetes, coronary artery disease, essential hypertension, obstructive sleep apnea, peripheral vascular disease status post BKA on the left, essential hypertension, atrial fibrillation presented with mechanical fall and left hip pain.  He was found to have nondisplaced fracture of the left greater trochanter.  Orthopedics was consulted.  Assessment & Plan:   Nondisplaced fracture of left greater trochanter status post mechanical fall -Orthopedics has been consulted: Follow recommendations.  Pain management.  Fall precautions.    Chronic atrial fibrillation--rate controlled.  Eliquis on hold.  Continue atenolol  Hypertension Hyperlipidemia CAD with history of CABG: Stable -Continue amlodipine, atenolol, hydralazine and statin.  Outpatient follow-up with cardiology.  AKI on chronic kidney disease stage IIIb -Creatinine 1.87 on presentation.  No labs today.  Continue hydration.  Repeat a.m. labs.  BPH--continue tamsulosin  Diabetes mellitus type 2 -continue CBGs with SSI.  Leukocytosis -Possibly reactive.  Mild.  Monitor intermittently  Anemia chronic disease -From chronic illnesses.  Hemoglobin stable.  Monitor intermittently  Peripheral arterial disease status post left BKA -Trental on hold   DVT prophylaxis: SCDs Code Status: Full Family Communication: Son at bedside Disposition Plan: Status is: Inpatient Remains inpatient appropriate because: Of failure to progress    Consultants: Orthopedics  Procedures: None  Antimicrobials: None   Subjective: Patient seen and examined at bedside.  Complains of intermittent left hip pain.  No fever, vomiting, shortness of breath reported.  Objective: Vitals:   12/01/22 0000 12/01/22 0100 12/01/22 0313 12/01/22 0400  BP: (!)  123/59 133/64  137/67  Pulse: 64 62  60  Resp: Temp:   99.4 F (37.4 C)   TempSrc:   Oral   SpO2: 95% 98%  96%    Intake/Output Summary (Last 24 hours) at 12/01/2022 0713 Last data filed at 12/01/2022 0457 Gross per 24 hour  Intake 979.17 ml  Output --  Net 979.17 ml   There were no vitals filed for this visit.  Examination:  General exam: Appears calm and comfortable.  Currently on 2 L oxygen via nasal cannula intermittently.  Looks chronically ill and deconditioned.  Elderly male lying in bed. Respiratory system: Bilateral decreased breath sounds at bases Crackles Cardiovascular system: S1 & S2 heard, Rate controlled Gastrointestinal system: Abdomen is nondistended, soft and nontender. Normal bowel sounds heard. Extremities: Trace right lower extremity edema present; left BKA present  Central nervous system: Alert and oriented.  Slow to respond.  No focal neurological deficits. Moving extremities Skin: No rashes, lesions or ulcers Psychiatry: Flat affect.  Not agitated.   Data Reviewed: I have personally reviewed following labs and imaging studies  CBC: Recent Labs  Lab 11/30/22 2050 12/01/22 0645  WBC 9.4 11.1*  NEUTROABS 5.5  --   HGB 10.9* 10.2*  HCT 34.0* 32.7*  MCV 92.9 93.2  PLT 178 158   Basic Metabolic Panel: Recent Labs  Lab 11/30/22 2050  NA 138  K 3.7  CL 102  CO2 27  GLUCOSE 124*  BUN 33*  CREATININE 1.87*  CALCIUM 8.9   GFR: CrCl cannot be calculated (Unknown ideal weight.). Liver Function Tests: No results for input(s): "AST", "ALT", "ALKPHOS", "BILITOT", "PROT", "ALBUMIN" in the last 168 hours. No results for input(s): "LIPASE", "AMYLASE" in the last 168 hours. No results for input(s): "AMMONIA" in the last  168 hours. Coagulation Profile: No results for input(s): "INR", "PROTIME" in the last 168 hours. Cardiac Enzymes: No results for input(s): "CKTOTAL", "CKMB", "CKMBINDEX", "TROPONINI" in the last 168 hours. BNP (last 3  results) Recent Labs    09/28/22 1024 10/05/22 1412  PROBNP 2,173* 2,429*   HbA1C: No results for input(s): "HGBA1C" in the last 72 hours. CBG: No results for input(s): "GLUCAP" in the last 168 hours. Lipid Profile: No results for input(s): "CHOL", "HDL", "LDLCALC", "TRIG", "CHOLHDL", "LDLDIRECT" in the last 72 hours. Thyroid Function Tests: No results for input(s): "TSH", "T4TOTAL", "FREET4", "T3FREE", "THYROIDAB" in the last 72 hours. Anemia Panel: No results for input(s): "VITAMINB12", "FOLATE", "FERRITIN", "TIBC", "IRON", "RETICCTPCT" in the last 72 hours. Sepsis Labs: No results for input(s): "PROCALCITON", "LATICACIDVEN" in the last 168 hours.  No results found for this or any previous visit (from the past 240 hour(s)).       Radiology Studies: DG Pelvis 1-2 Views  Result Date: 11/30/2022 CLINICAL DATA:  Fall, left hip pain EXAM: PELVIS - 1-2 VIEW COMPARISON:  Left femur series today FINDINGS: There is a minimally displaced fracture through the left greater trochanter. No visible involvement of the femoral neck. No subluxation or dislocation. Mild symmetric degenerative changes in the hips. IMPRESSION: Left greater trochanter fracture. No visible femoral neck involvement. Electronically Signed   By: Charlett Nose M.D.   On: 11/30/2022 20:37   DG Femur Min 2 Views Left  Result Date: 11/30/2022 CLINICAL DATA:  Fall, left hip pain EXAM: LEFT FEMUR 2 VIEWS COMPARISON:  CT abdomen and pelvis 06/15/2019 FINDINGS: Irregularity in the greater trochanter compatible with fracture. No visible fracture crossing the femoral neck. No subluxation or dislocation. Mild degenerative changes in the left hip and left knee. No joint effusion within the left knee. IMPRESSION: Minimally displaced fracture through the left femoral greater trochanter. No visible femoral neck involvement. Electronically Signed   By: Charlett Nose M.D.   On: 11/30/2022 20:36        Scheduled Meds:  amLODipine  10 mg  Oral Daily   atenolol  25 mg Oral Daily   ferrous sulfate  325 mg Oral QHS   finasteride  5 mg Oral Daily   hydrALAZINE  75 mg Oral BID   insulin aspart  0-5 Units Subcutaneous QHS   insulin aspart  0-9 Units Subcutaneous TID WC   rosuvastatin  40 mg Oral Daily   tamsulosin  0.4 mg Oral Daily   Continuous Infusions:  lactated ringers Stopped (12/01/22 0457)   lactated ringers 100 mL/hr at 12/01/22 0457          Glade Lloyd, MD [facility] 12/01/2022, 7:13 AM

## 2022-12-01 NOTE — ED Notes (Signed)
Ortho and med tech rec at [facility]

## 2022-12-02 ENCOUNTER — Inpatient Hospital Stay (HOSPITAL_COMMUNITY): Payer: PPO

## 2022-12-02 DIAGNOSIS — S72115A Nondisplaced fracture of greater trochanter of left femur, initial encounter for closed fracture: Secondary | ICD-10-CM | POA: Diagnosis not present

## 2022-12-02 LAB — CBC WITH DIFFERENTIAL/PLATELET
Abs Immature Granulocytes: 0.04 10*3/uL (ref 0.00–0.07)
Basophils Absolute: 0.1 10*3/uL (ref 0.0–0.1)
Basophils Relative: 1 %
Eosinophils Absolute: 0.1 10*3/uL (ref 0.0–0.5)
Eosinophils Relative: 1 %
HCT: 29.4 % — ABNORMAL LOW (ref 39.0–52.0)
Hemoglobin: 9.8 g/dL — ABNORMAL LOW (ref 13.0–17.0)
Immature Granulocytes: 0 %
Lymphocytes Relative: 31 %
Lymphs Abs: 3.8 10*3/uL (ref 0.7–4.0)
MCH: 30.6 pg (ref 26.0–34.0)
MCHC: 33.3 g/dL (ref 30.0–36.0)
MCV: 91.9 fL (ref 80.0–100.0)
Monocytes Absolute: 1.1 10*3/uL — ABNORMAL HIGH (ref 0.1–1.0)
Monocytes Relative: 9 %
Neutro Abs: 7.2 10*3/uL (ref 1.7–7.7)
Neutrophils Relative %: 58 %
Platelets: 136 10*3/uL — ABNORMAL LOW (ref 150–400)
RBC: 3.2 MIL/uL — ABNORMAL LOW (ref 4.22–5.81)
RDW: 16.4 % — ABNORMAL HIGH (ref 11.5–15.5)
WBC: 12.3 10*3/uL — ABNORMAL HIGH (ref 4.0–10.5)
nRBC: 0 % (ref 0.0–0.2)

## 2022-12-02 LAB — BASIC METABOLIC PANEL
Anion gap: 8 (ref 5–15)
BUN: 30 mg/dL — ABNORMAL HIGH (ref 8–23)
CO2: 24 mmol/L (ref 22–32)
Calcium: 8.6 mg/dL — ABNORMAL LOW (ref 8.9–10.3)
Chloride: 101 mmol/L (ref 98–111)
Creatinine, Ser: 1.43 mg/dL — ABNORMAL HIGH (ref 0.61–1.24)
GFR, Estimated: 46 mL/min — ABNORMAL LOW (ref >60)
Glucose, Bld: 92 mg/dL (ref 70–99)
Potassium: 4.6 mmol/L (ref 3.5–5.1)
Sodium: 133 mmol/L — ABNORMAL LOW (ref 135–145)

## 2022-12-02 LAB — URINE CULTURE

## 2022-12-02 LAB — GLUCOSE, CAPILLARY
Glucose-Capillary: 78 mg/dL (ref 70–99)
Glucose-Capillary: 126 mg/dL — ABNORMAL HIGH (ref 70–99)
Glucose-Capillary: 114 mg/dL — ABNORMAL HIGH (ref 70–99)
Glucose-Capillary: 180 mg/dL — ABNORMAL HIGH (ref 70–99)

## 2022-12-02 LAB — MAGNESIUM: Magnesium: 1.7 mg/dL (ref 1.7–2.4)

## 2022-12-02 NOTE — Hospital Course (Addendum)
89 y.o. male with medical history significant of diet-controlled diabetes, coronary artery disease, essential hypertension, obstructive sleep apnea, peripheral vascular disease status post BKA on the left, essential hypertension, atrial fibrillation presented with mechanical fall and left hip pain.  He was found to have nondisplaced fracture of the left greater trochanter.  Orthopedics was consulted  and admitted. Seen by Dr. Victorino Dike had MRI of the left hip showing acute fracture of the greater trochanter with superior displacement up to 2 cm, mild bilateral hip osteoarthritis no other fractures.  Discussed Dr. Victorino Dike 4/25- plan for non operative treatment, resumed Eliquis and cont weightbearing as tolerated.  PT OT has evaluated and recommending inpatient rehab. Awaiting CIR

## 2022-12-02 NOTE — Evaluation (Signed)
Physical Therapy Evaluation Patient Details Name: Eric [name]. MRN: [id number] DOB: [birth year] Today's Date: 12/02/2022  History of Present Illness  Pt is a 89 y.o. male admitted 11/30/22 after sudden onset L hip pain resulted in fall causing L greater trochanteric fx. Per ortho, plan for non-operative management WBAT with no active hip abduction. PMH includes L BKA (2019), HTN, DM2, CKD 3, CAD s/p CABG, afib on Eliquis, BPH, HLD.   Clinical Impression  Pt presents with an overall decrease in functional mobility secondary to above. PTA, pt independent ambulating with LLE prosthetic, intermittent use of wheelchair, lives with wife; pt's children live nearby and check in daily. Initiated educ re: precautions, positioning, and importance of mobility. Today, pt able to initiate transfer training, including hopping a few times on RLE with RW and modA+2. Pt motivated to participate and regain PLOF. Pt would benefit from intensive post-acute rehab (>3hrs/day) to maximize functional mobility and independence prior to return home.        Recommendations for follow up therapy are one component of a multi-disciplinary discharge planning process, led by the attending physician.  Recommendations may be updated based on patient status, additional functional criteria and insurance authorization.  Follow Up Recommendations       Assistance Recommended at Discharge Frequent or constant Supervision/Assistance  Patient can return home with the following  A lot of help with walking and/or transfers;A lot of help with bathing/dressing/bathroom;Assistance with cooking/housework;Assist for transportation;Help with stairs or ramp for entrance    Equipment Recommendations Other (comment) (TBD)  Recommendations for Other Services       Functional Status Assessment Patient has had a recent decline in their functional status and demonstrates the ability to make significant improvements in function in a  reasonable and predictable amount of time.     Precautions / Restrictions Precautions Precautions: Fall;Other (comment) Precaution Comments: h/o L BKA (does not have prosthetic in room) Restrictions Weight Bearing Restrictions: Yes LLE Weight Bearing: Weight bearing as tolerated Other Position/Activity Restrictions: per secure chat with ortho PA Charma Igo) 12/02/22 8A - confirmed LLE WBAT, no active hip abduction      Mobility  Bed Mobility Overal bed mobility: Needs Assistance Bed Mobility: Supine to Sit     Supine to sit: Mod assist, +2 for safety/equipment, HOB elevated     General bed mobility comments: modA+2 for trunk elevation and scooting L hip towards EOB, increased time and effort secondary to pain    Transfers Overall transfer level: Needs assistance Equipment used: Rolling walker (2 wheels) Transfers: Sit to/from Stand, Bed to chair/wheelchair/BSC Sit to Stand: Mod assist, +2 physical assistance   Step pivot transfers: Mod assist, +2 physical assistance       General transfer comment: pt able to stand on second attempt from elevated bed height to RW with modA+2, pt able to minimally hop on RLE with modA for stability and RW management in order to hop pivotally from bed>recliner; modA for eccentric control to sit    Ambulation/Gait                  Stairs            Wheelchair Mobility    Modified Rankin (Stroke Patients Only)       Balance Overall balance assessment: Needs assistance   Sitting balance-Leahy Scale: Fair Sitting balance - Comments: initial modA for sitting balance progressing to min guard with intermittent UE support   Standing balance support: Bilateral upper extremity supported, Reliant on  assistive device for balance Standing balance-Leahy Scale: Poor Standing balance comment: reliant on BUE support and external assist standing with RW on RLE                             Pertinent Vitals/Pain Pain  Assessment Pain Assessment: Faces Faces Pain Scale: Hurts even more Pain Location: L hip with movement Pain Descriptors / Indicators: Discomfort, Grimacing, Guarding Pain Intervention(s): Monitored during session, Repositioned, Limited activity within patient's tolerance    Home Living Family/patient expects to be discharged to:: Private residence Living Arrangements: Spouse/significant other Available Help at Discharge: Family;Available PRN/intermittently (son that checks in regularly) Type of Home: House Home Access: Level entry     Alternate Level Stairs-Number of Steps: stair lift from basement entrance (level entry) to main level Home Layout: Two level Home Equipment: Agricultural consultant (2 wheels);Rollator (4 wheels);Wheelchair - Engineer, technical sales - power;Shower seat;Hand held shower head;Grab bars - tub/shower Additional Comments: wife mod indep with SPC; daughter and son live nearby, check on pt near daily    Prior Function Prior Level of Function : Independent/Modified Independent             Mobility Comments: typically independent ambulating with LLE prosthetic donned, no DME. reports using w/c first thing in the morning ADLs Comments: independent and manages own meds, participates in IADL     Hand Dominance   Dominant Hand: Right    Extremity/Trunk Assessment   Upper Extremity Assessment Upper Extremity Assessment: RUE deficits/detail;Generalized weakness RUE Deficits / Details: h/o R hand numbness and noted arthritic changes in fingers    Lower Extremity Assessment Lower Extremity Assessment: Generalized weakness;LLE deficits/detail LLE Deficits / Details: h/o L BKA; hip and knee flex/ext >/ 3/5 strength       Communication   Communication: HOH  Cognition Arousal/Alertness: Awake/alert Behavior During Therapy: WFL for tasks assessed/performed Overall Cognitive Status: Within Functional Limits for tasks assessed                                  General Comments: WFL for simple tasks, not formally assesesd. good sense of humor; sequencing mobility very well with minimal cuing        General Comments General comments (skin integrity, edema, etc.): O2 Flat Lick removed for session, SpO2 94-98% on RA, HR 70s. initiated educ re: precautions, positioning, activity recommendations, importance of OOB mobility, discharge planning. pt interested in post-acute rehab    Exercises General Exercises - Lower Extremity Straight Leg Raises: AROM, AAROM, Left, Right, Supine Hip Flexion/Marching: AROM, AAROM, Left, Seated   Assessment/Plan    PT Assessment Patient needs continued PT services  PT Problem List Decreased strength;Decreased activity tolerance;Decreased balance;Decreased range of motion;Decreased mobility;Decreased knowledge of use of DME;Decreased knowledge of precautions;Pain       PT Treatment Interventions DME instruction;Gait training;Functional mobility training;Therapeutic activities;Therapeutic exercise;Balance training;Patient/family education;Wheelchair mobility training    PT Goals (Current goals can be found in the Care Plan section)  Acute Rehab PT Goals Patient Stated Goal: regain independence PT Goal Formulation: With patient Time For Goal Achievement: 12/16/22 Potential to Achieve Goals: Good    Frequency Min 5X/week     Co-evaluation PT/OT/SLP Co-Evaluation/Treatment: Yes Reason for Co-Treatment: For patient/therapist safety;To address functional/ADL transfers PT goals addressed during session: Mobility/safety with mobility;Balance;Proper use of DME         AM-PAC PT "6 Clicks" Mobility  Outcome Measure Help  needed turning from your back to your side while in a flat bed without using bedrails?: A Lot Help needed moving from lying on your back to sitting on the side of a flat bed without using bedrails?: A Lot Help needed moving to and from a bed to a chair (including a wheelchair)?: A Lot Help needed  standing up from a chair using your arms (e.g., wheelchair or bedside chair)?: A Lot Help needed to walk in hospital room?: Total Help needed climbing 3-5 steps with a railing? : Total 6 Click Score: 10    End of Session Equipment Utilized During Treatment: Gait belt Activity Tolerance: Patient tolerated treatment well Patient left: in chair;with call bell/phone within reach;with chair alarm set Nurse Communication: Mobility status PT Visit Diagnosis: Other abnormalities of gait and mobility (R26.89);Muscle weakness (generalized) (M62.81)    Time: 1610-9604 PT Time Calculation (min) (ACUTE ONLY): 28 min   Charges:   PT Evaluation $PT Eval Moderate Complexity: 1 Mod        Ina Homes, PT, DPT Acute Rehabilitation Services  Personal: Secure Chat Rehab Office: [phone]  Malachy Chamber 12/02/2022, 11:52 AM

## 2022-12-02 NOTE — Evaluation (Signed)
Occupational Therapy Evaluation Patient Details Name: Eric [name]. MRN: [id number] DOB: 08/01/1928 Today's Date: 12/02/2022   History of Present Illness Pt is a 89 y.o. male admitted 11/30/22 after sudden onset L hip pain resulted in fall causing L greater trochanteric fx. Per ortho, plan for non-operative management WBAT with no active hip abduction. PMH includes L BKA (2019), HTN, DM2, CKD 3, CAD s/p CABG, afib on Eliquis, BPH, HLD.   Clinical Impression   Pt is typically independent in ADL and mobility with prosthetic. Manages his own medications, participates in IADL. Pt today is mod A +2 for bed mobility, unable to reach down to RLE for donning sock (max A),  Able to tolerate sitting EOB for light grooming and UB bathing. Pt then mod A +2 to pivot to recliner (no prosthetic in room). Pt will benefit from skilled OT in the acute setting as well as afterwards at post-acute rehab of >3 hours/day as Pt has excellent family support to return to PLOF.      Recommendations for follow up therapy are one component of a multi-disciplinary discharge planning process, led by the attending physician.  Recommendations may be updated based on patient status, additional functional criteria and insurance authorization.   Assistance Recommended at Discharge Frequent or constant Supervision/Assistance  Patient can return home with the following A lot of help with walking and/or transfers;A lot of help with bathing/dressing/bathroom;Assist for transportation;Help with stairs or ramp for entrance    Functional Status Assessment  Patient has had a recent decline in their functional status and demonstrates the ability to make significant improvements in function in a reasonable and predictable amount of time.  Equipment Recommendations  None recommended by OT (Pt has appropriate DME)    Recommendations for Other Services Rehab consult     Precautions / Restrictions Precautions Precautions: Fall;Other  (comment) Precaution Comments: h/o L BKA (does not have prosthetic in room) Restrictions Weight Bearing Restrictions: Yes LLE Weight Bearing: Weight bearing as tolerated Other Position/Activity Restrictions: per secure chat with ortho PA Charma Igo) 12/02/22 8A - confirmed LLE WBAT, no active hip abduction      Mobility Bed Mobility Overal bed mobility: Needs Assistance Bed Mobility: Supine to Sit     Supine to sit: Mod assist, +2 for safety/equipment, HOB elevated     General bed mobility comments: modA+2 for trunk elevation and scooting L hip towards EOB, increased time and effort secondary to pain    Transfers Overall transfer level: Needs assistance Equipment used: Rolling walker (2 wheels) Transfers: Sit to/from Stand, Bed to chair/wheelchair/BSC Sit to Stand: Mod assist, +2 physical assistance     Step pivot transfers: Mod assist, +2 physical assistance     General transfer comment: pt able to stand on second attempt from elevated bed height to RW with modA+2, pt able to minimally hop on RLE with modA for stability and RW management in order to hop pivotally from bed>recliner; modA for eccentric control to sit      Balance Overall balance assessment: Needs assistance   Sitting balance-Leahy Scale: Fair Sitting balance - Comments: initial modA for sitting balance progressing to min guard with intermittent UE support   Standing balance support: Bilateral upper extremity supported, Reliant on assistive device for balance Standing balance-Leahy Scale: Poor Standing balance comment: reliant on BUE support and external assist standing with RW on RLE  ADL either performed or assessed with clinical judgement   ADL Overall ADL's : Needs assistance/impaired Eating/Feeding: Set up;Sitting   Grooming: Wash/dry hands;Wash/dry face;Set up;Sitting Grooming Details (indicate cue type and reason): EOB Upper Body Bathing: Moderate  assistance Upper Body Bathing Details (indicate cue type and reason): for back Lower Body Bathing: Moderate assistance Lower Body Bathing Details (indicate cue type and reason): assist for knees down Upper Body Dressing : Moderate assistance;Sitting Upper Body Dressing Details (indicate cue type and reason): UE needed to support balance at this time Lower Body Dressing: Maximal assistance;Bed level;Sitting/lateral leans   Toilet Transfer: Moderate assistance;+2 for physical assistance;+2 for safety/equipment;Stand-pivot;BSC/3in1;Rolling walker (2 wheels) Toilet Transfer Details (indicate cue type and reason): assist for boost Toileting- Clothing Manipulation and Hygiene: Maximal assistance       Functional mobility during ADLs: Moderate assistance;+2 for physical assistance;+2 for safety/equipment;Cueing for safety;Cueing for sequencing;Rolling walker (2 wheels) General ADL Comments: decreased access to LB for ADL, pain, decreased knowledge of AE, balance     Vision Patient Visual Report: No change from baseline       Perception     Praxis      Pertinent Vitals/Pain Pain Assessment Pain Assessment: Faces Faces Pain Scale: Hurts even more Pain Location: L hip with movement Pain Descriptors / Indicators: Discomfort, Grimacing, Guarding Pain Intervention(s): Monitored during session, Repositioned     Hand Dominance Right   Extremity/Trunk Assessment Upper Extremity Assessment Upper Extremity Assessment: Overall WFL for tasks assessed RUE Deficits / Details: h/o R hand numbness and noted arthritic changes in fingers   Lower Extremity Assessment Lower Extremity Assessment: Defer to PT evaluation LLE Deficits / Details: h/o L BKA; hip and knee flex/ext >/ 3/5 strength   Cervical / Trunk Assessment Cervical / Trunk Assessment: Normal   Communication Communication Communication: HOH   Cognition Arousal/Alertness: Awake/alert Behavior During Therapy: WFL for tasks  assessed/performed Overall Cognitive Status: Within Functional Limits for tasks assessed                                 General Comments: WFL for simple tasks, not formally assesesd. good sense of humor; sequencing mobility very well with minimal cuing     General Comments  O2 [place] removed for session, SpO2 94-98% on RA, HR 70s. initiated educ re: precautions, positioning, activity recommendations, importance of OOB mobility, discharge planning. pt interested in post-acute rehab    Exercises     Shoulder Instructions      Home Living Family/patient expects to be discharged to:: Private residence Living Arrangements: Spouse/significant other Available Help at Discharge: Family;Available PRN/intermittently (son that checks in regularly) Type of Home: House Home Access: Level entry     Home Layout: Two level Alternate Level Stairs-Number of Steps: stair lift from basement entrance (level entry) to main level   Bathroom Shower/Tub: Producer, television/film/video: Handicapped height     Home Equipment: Agricultural consultant (2 wheels);Rollator (4 wheels);Wheelchair - Engineer, technical sales - power;Shower seat;Hand held shower head;Grab bars - tub/shower   Additional Comments: wife mod indep with SPC; daughter and son live nearby, check on pt near daily      Prior Functioning/Environment Prior Level of Function : Independent/Modified Independent             Mobility Comments: typically independent ambulating with LLE prosthetic donned, no DME. reports using w/c first thing in the morning ADLs Comments: independent and manages own meds, participates in IADL  OT Problem List: Decreased range of motion;Decreased activity tolerance;Impaired balance (sitting and/or standing);Decreased knowledge of use of DME or AE;Decreased knowledge of precautions;Pain      OT Treatment/Interventions: Self-care/ADL training;DME and/or AE instruction;Therapeutic  activities;Patient/family education;Balance training    OT Goals(Current goals can be found in the care plan section) Acute Rehab OT Goals Patient Stated Goal: return to PLOF OT Goal Formulation: With patient Time For Goal Achievement: 12/16/22 Potential to Achieve Goals: Good ADL Goals Pt Will Perform Grooming: with modified independence;sitting Pt Will Perform Upper Body Dressing: with modified independence;sitting Pt Will Perform Lower Body Dressing: sit to/from stand;with min assist Pt Will Transfer to Toilet: with min guard assist;stand pivot transfer Pt Will Perform Toileting - Clothing Manipulation and hygiene: with supervision;sitting/lateral leans  OT Frequency: Min 2X/week    Co-evaluation   Reason for Co-Treatment: For patient/therapist safety;To address functional/ADL transfers PT goals addressed during session: Mobility/safety with mobility;Balance;Proper use of DME        AM-PAC OT "6 Clicks" Daily Activity     Outcome Measure Help from another person eating meals?: None Help from another person taking care of personal grooming?: A Little Help from another person toileting, which includes using toliet, bedpan, or urinal?: A Lot Help from another person bathing (including washing, rinsing, drying)?: A Lot Help from another person to put on and taking off regular upper body clothing?: A Little Help from another person to put on and taking off regular lower body clothing?: A Lot 6 Click Score: 16   End of Session Equipment Utilized During Treatment: Gait belt;Rolling walker (2 wheels) Nurse Communication: Mobility status;Precautions;Weight bearing status  Activity Tolerance: Patient tolerated treatment well Patient left: in chair;with call bell/phone within reach;with chair alarm set  OT Visit Diagnosis: Unsteadiness on feet (R26.81);Muscle weakness (generalized) (M62.81);Pain Pain - Right/Left: Left Pain - part of body: Hip                Time: 1610-9604 OT Time  Calculation (min): 28 min Charges:  OT General Charges $OT Visit: 1 Visit OT Evaluation $OT Eval Moderate Complexity: 1 Mod  Nyoka Cowden OTR/L Acute Rehabilitation Services Office: [phone]  Emelda Fear 12/02/2022, 2:20 PM

## 2022-12-02 NOTE — Plan of Care (Signed)

## 2022-12-02 NOTE — Progress Notes (Signed)
PROGRESS NOTE Eric [name].  WUJ:[id number] DOB: 1927/10/06 DOA: 11/30/2022 PCP: [name], MD  Brief Narrative/Hospital Course: 89 y.o. male with medical history significant of diet-controlled diabetes, coronary artery disease, essential hypertension, obstructive sleep apnea, peripheral vascular disease status post BKA on the left, essential hypertension, atrial fibrillation presented with mechanical fall and left hip pain.  He was found to have nondisplaced fracture of the left greater trochanter.  Orthopedics was consulted  and admitted. Seen by Dr. Victorino Dike     Subjective: Seen and examined this morning he is very hard of hearing, worked with PT OT now on bedside chair Overnight heart rate in 50s mostly noted 1 time 44, BP stable 120s/40s, on 2 L nasal cannula Labs shows mild leukocytosis/anemia and mild thrombocytopenia at 12.3 WBC, 9.8 hemoglobin, 136 platelet Creatinine at 1.8>1.6>1.4 improving   Assessment and Plan: Principal Problem:   Closed left femoral fracture Active Problems:   Essential hypertension   Obstructive sleep apnea   Chronic coronary artery disease   Type 2 diabetes mellitus   PVD (peripheral vascular disease) (HCC)   Chronic anticoagulation   Atrial flutter   Hx of BKA, left   Acute kidney injury superimposed on CKD   Acute left greater trochanter fracture nondisplaced secondary to fall mechanical w/ lt BKA: Seen by Dr. Victorino Dike.  MRI has been ordered, continue pain management, fall precaution and await further input from orthopedics-likely nonoperative intervention in that case we will resume Eliquis following MRI  Chronic atrial fibrillation--rate controlled.  Eliquis on hold.  Continue atenolol   Hypertension Hyperlipidemia CAD with history of CABG: Continue amlodipine, atenolol, hydralazine as BP tolerates, held this morning, continue statin.   AKI on chronic kidney disease stage IIIb:Creatinine at 1.8>1.6>1.4 improving  Recent Labs     03/16/22 1041 08/06/22 1045 09/28/22 1024 10/05/22 1412 10/19/22 1422 11/16/22 1113 11/23/22 1003 11/30/22 2050 12/01/22 0645 12/02/22 0245  BUN 21 18 28 24  31* 19 29* 33* 30* 30*  CREATININE 1.35* 1.18 2.50* 1.21 1.71* 1.35* 1.70* 1.87* 1.60* 1.43*    BPH--continue tamsulosin   T2DM:continue CBGs with SSI. Recent Labs  Lab 12/01/22 1239 12/01/22 1646 12/01/22 2056 12/02/22 0731 12/02/22 1114  GLUCAP 98 133* 133* 78 126*      Leukocytosis:Possibly reactive.  Improving.  Monitor Recent Labs  Lab 11/30/22 2050 12/01/22 0645 12/02/22 0245  WBC 9.4 11.1* 12.3*     Anemia chronic disease:From chronic illnesses.  Hemoglobin stable.  Monitor intermittently  Mild thrombocytopenia in the setting of acute illness monitor  Peripheral arterial disease status post left NFA:OZHYQMV on hold  DVT prophylaxis: SCDs Start: 12/01/22 0353 Code Status:   Code Status: Full Code Family Communication: plan of care discussed with patient at bedside. Patient status is: Inpatient because of due to fracture and fall Level of care: Med-Surg   Dispo: The patient is from: home            Anticipated disposition: TBD Objective: Vitals last 24 hrs: Vitals:   12/01/22 1830 12/01/22 2113 12/02/22 0434 12/02/22 0733  BP:  (!) 132/47 (!) 123/51 (!) 126/47  Pulse:  (!) 53 (!) 57 (!) 54  Resp:  18 18 18   Temp:  98.4 F (36.9 C) 98.7 F (37.1 C) 99.1 F (37.3 C)  TempSrc:  Oral Oral Oral  SpO2:  97% 95% 93%  Weight: 77.2 kg     Height: 5\' 5"  (1.651 m)      Weight change:   Physical Examination: General exam: alert  awake, older than stated age HEENT:Oral mucosa moist, Ear/Nose WNL grossly Respiratory system: bilaterally clear BS, no use of accessory muscle Cardiovascular system: S1 & S2 +, No JVD. Gastrointestinal system: Abdomen soft,NT,ND, BS+ Nervous System:Alert, awake, moving extremities. Extremities: LE edema neg, lt BKA,distal peripheral pulses palpable.  Skin: No rashes,no  icterus. MSK: Normal muscle bulk,tone, power  Medications reviewed:  Scheduled Meds:  amLODipine  10 mg Oral Daily   atenolol  25 mg Oral Daily   ferrous sulfate  325 mg Oral QHS   finasteride  5 mg Oral Daily   hydrALAZINE  75 mg Oral BID   insulin aspart  0-5 Units Subcutaneous QHS   insulin aspart  0-9 Units Subcutaneous TID WC   rosuvastatin  40 mg Oral Daily   tamsulosin  0.4 mg Oral Daily   Continuous Infusions:  lactated ringers 100 mL/hr at 12/02/22 1118    Diet Order             Diet heart healthy/carb modified Room service appropriate? Yes; Fluid consistency: Thin  Diet effective now                   Intake/Output Summary (Last 24 hours) at 12/02/2022 1133 Last data filed at 12/02/2022 0436 Gross per 24 hour  Intake 1796.69 ml  Output 850 ml  Net 946.69 ml   Net IO Since Admission: 1,925.86 mL [12/02/22 1133]  Wt Readings from Last 3 Encounters:  12/01/22 77.2 kg  11/16/22 75.7 kg  09/28/22 75.6 kg     Unresulted Labs (From admission, onward)     Start     Ordered   12/01/22 1857  Urine Culture  (Urine Culture)  Once,   R       Comments: Sediment in urine   Question:  Indication  Answer:  Bacteriuria screening (OB/GYN or Uro)   12/01/22 1857          Data Reviewed: I have personally reviewed following labs and imaging studies CBC: Recent Labs  Lab 11/30/22 2050 12/01/22 0645 12/02/22 0245  WBC 9.4 11.1* 12.3*  NEUTROABS 5.5  --  7.2  HGB 10.9* 10.2* 9.8*  HCT 34.0* 32.7* 29.4*  MCV 92.9 93.2 91.9  PLT 178 158 136*   Basic Metabolic Panel: Recent Labs  Lab 11/30/22 2050 12/01/22 0645 12/02/22 0245  NA 138 139 133*  K 3.7 3.7 4.6  CL 102 107 101  CO2 GLUCOSE 124* 80 92  BUN 33* 30* 30*  CREATININE 1.87* 1.60* 1.43*  CALCIUM 8.9 8.6* 8.6*  MG  --   --  1.7   GFR: Estimated Creatinine Clearance: 31.6 mL/min (A) (by C-G formula based on SCr of 1.43 mg/dL (H)). Liver Function Tests: Recent Labs  Lab 12/01/22 0645   AST 24  ALT 20  ALKPHOS 44  BILITOT 0.9  PROT 5.8*  ALBUMIN 2.7*  BNP (last 3 results) Recent Labs    09/28/22 1024 10/05/22 1412  PROBNP 2,173* 2,429*   HbA1C: No results for input(s): "HGBA1C" in the last 72 hours. CBG: Recent Labs  Lab 12/01/22 1239 12/01/22 1646 12/01/22 2056 12/02/22 0731 12/02/22 1114  GLUCAP 98 133* 133* 78 126*   No results found for this or any previous visit (from the past 240 hour(s)).  Antimicrobials: Anti-infectives (From admission, onward)    None      Culture/Microbiology None this admission  CT HIP LEFT WO CONTRAST  Result Date: 12/01/2022 CLINICAL DATA:  Left hip fracture  EXAM: CT OF THE LEFT HIP WITHOUT CONTRAST TECHNIQUE: Multidetector CT imaging of the left hip was performed according to the standard protocol. Multiplanar CT image reconstructions were also generated. RADIATION DOSE REDUCTION: This exam was performed according to the departmental dose-optimization program which includes automated exposure control, adjustment of the mA and/or kV according to patient size and/or use of iterative reconstruction technique. COMPARISON:  Radiographs 11/30/2022 and CT pelvis from 06/15/2019 FINDINGS: Bones/Joint/Cartilage Comminuted fracture the greater trochanter with up to 0.8 cm proximal displacement components of the greater trochanter. This type of fracture is somewhat unusual and typically associated with an intertrochanteric fracture; however in this case, CT does not conclusively reveal a full intertrochanteric component of the fracture. No other regional fracture of the left hemipelvis. 8 mm anterolisthesis of L5 on S1 associated with known bilateral pars defects. Ligaments Suboptimally assessed by CT. Muscles and Tendons Indistinctness of tissue planes and hazy density along the gluteal musculature and quadratus obturator externus muscles. Abnormal expansion and density of the left hamstring tendon proximally suggesting hamstring tendon  tear or injury. Soft tissues Foley catheter in the somewhat thick-walled but decompressed urinary bladder. Bladder diverticula are noted and demonstrate wall thickening and marginal stranding questionable prominent lymph nodes versus additional new diverticula of the urinary bladder on both sides, for example on image 43 of series 5. Descending and sigmoid colon diverticulosis. IMPRESSION: 1. Comminuted fracture of the greater trochanter left femur with up to 0.8 cm proximal displacement components of the greater trochanter. This type of fracture is somewhat unusual and typically associated with an intertrochanteric fracture; however in this case, CT does not conclusively reveal a full intertrochanteric component of the fracture. The possibility of a nondisplaced occult full intertrochanteric fracture cannot be totally dismissed, and MRI left hip without contrast follow up to ensure the lack of such an occult fracture would be reasonable in some clinical circumstances. 2. Abnormal expansion and density of the left hamstring tendon proximally suggesting hamstring tendon tear or injury. 3. Indistinctness of tissue planes and hazy density along the gluteal musculature and quadratus obturator externus muscles, compatible with local hematoma 4. Foley catheter in the urinary bladder. Bladder diverticula are noted and demonstrate wall thickening and marginal stranding. There are questionable prominent lymph nodes versus additional new diverticula of the urinary bladder on both sides. Overall the appearance favors cystitis with reactive lymph nodes and inflamed associated bladder diverticula. 5. Descending and sigmoid colon diverticulosis. 6. 8 mm anterolisthesis of L5 on S1 associated with known bilateral pars defects. Electronically Signed   By: Gaylyn Rong M.D.   On: 12/01/2022 10:35   DG Pelvis 1-2 Views  Result Date: 11/30/2022 CLINICAL DATA:  Fall, left hip pain EXAM: PELVIS - 1-2 VIEW COMPARISON:  Left  femur series today FINDINGS: There is a minimally displaced fracture through the left greater trochanter. No visible involvement of the femoral neck. No subluxation or dislocation. Mild symmetric degenerative changes in the hips. IMPRESSION: Left greater trochanter fracture. No visible femoral neck involvement. Electronically Signed   By: Charlett Nose M.D.   On: 11/30/2022 20:37   DG Femur Min 2 Views Left  Result Date: 11/30/2022 CLINICAL DATA:  Fall, left hip pain EXAM: LEFT FEMUR 2 VIEWS COMPARISON:  CT abdomen and pelvis 06/15/2019 FINDINGS: Irregularity in the greater trochanter compatible with fracture. No visible fracture crossing the femoral neck. No subluxation or dislocation. Mild degenerative changes in the left hip and left knee. No joint effusion within the left knee. IMPRESSION: Minimally displaced fracture  through the left femoral greater trochanter. No visible femoral neck involvement. Electronically Signed   By: Charlett Nose M.D.   On: 11/30/2022 20:36     LOS: 2 days   Lanae Boast, MD [facility]  12/02/2022, 11:33 AM

## 2022-12-02 NOTE — Progress Notes (Signed)
Inpatient Rehab Coordinator Note:  I met with patient and his son, [name], at bedside to discuss CIR recommendations and goals/expectations of CIR stay.  We reviewed 3 hrs/day of therapy, physician follow up, and average length of stay 2 weeks (dependent upon progress) with goals of supervision.  We discussed insurance authorization process.  We discussed caregiver support and [name] states there are several children in the area that could provide supervision at discharge if needed.  Pt lives with spouse, and she is independent in her mobility/ADLs at home but likely cannot provide much in the way of support for pt. Will start insurance auth request today.   Estill Dooms, PT, DPT Admissions Coordinator [phone] 12/02/22  1:50 PM

## 2022-12-02 NOTE — Progress Notes (Signed)
Physical Therapy Treatment Patient Details Name: Eric [name]. MRN: [id number] DOB: December 05, 1927 Today's Date: 12/02/2022   History of Present Illness Pt is a 89 y.o. male admitted 11/30/22 after sudden onset L hip pain resulted in fall causing L greater trochanteric fx. Per ortho, plan for non-operative management WBAT with no active hip abduction. PMH includes L BKA (2019), HTN, DM2, CKD 3, CAD s/p CABG, afib on Eliquis, BPH, HLD.   PT Comments    Pt progressing with mobility, seen for additional session for transfer back to bed due to transport arrival for imaging. Pt demonstrates improved sitting balance at edge of recliner and sitting EOB, requiring modA for stand pivot transfer from bed>recliner with UE support on bedrail. Despite pain, pt motivated to participate and happy to be able to move out of bed. Pt limited by generalized weakness, decreased activity tolerance, pain and impaired balance strategies/postural reactions. Continue to recommend intensive post-acute rehab services (>3 hrs/day) to maximize functional mobility and independence prior to return home.    Recommendations for follow up therapy are one component of a multi-disciplinary discharge planning process, led by the attending physician.  Recommendations may be updated based on patient status, additional functional criteria and insurance authorization.     Assistance Recommended at Discharge Frequent or constant Supervision/Assistance  Patient can return home with the following A lot of help with walking and/or transfers;A lot of help with bathing/dressing/bathroom;Assistance with cooking/housework;Assist for transportation;Help with stairs or ramp for entrance   Equipment Recommendations   (defer to next venue; well-equipped)    Recommendations for Other Services       Precautions / Restrictions Precautions Precautions: Fall;Other (comment) Precaution Comments: h/o L BKA (does not have prosthetic in  room) Restrictions Weight Bearing Restrictions: Yes LLE Weight Bearing: Weight bearing as tolerated Other Position/Activity Restrictions: per secure chat with ortho PA Charma Igo) 12/02/22 8A - confirmed LLE WBAT, no active hip abduction     Mobility  Bed Mobility Overal bed mobility: Needs Assistance Bed Mobility: Sit to Supine     Sit to supine: Mod assist   General bed mobility comments: modA for LE management with return to supine    Transfers Overall transfer level: Needs assistance Equipment used: None Transfers: Bed to chair/wheelchair/BSC Sit to Stand: Mod assist, +2 physical assistance Stand pivot transfers: Mod assist       General transfer comment: assist for transfer set up from bed>recliner towards RLE, assist to scoot hips to EOB then pt with good anterior weight translation using armrests, modA for trunk elevation with pt assisting by pulling on bed rail, modA for pivoting hips and maintaining upright posture, pt with good awareness of transfer sequencing with min cues. further mobility limited by arrival of transport team for imaging    Ambulation/Gait                   Stairs             Wheelchair Mobility    Modified Rankin (Stroke Patients Only)       Balance Overall balance assessment: Needs assistance Sitting-balance support: No upper extremity supported Sitting balance-Leahy Scale: Fair Sitting balance - Comments: initial modA for sitting balance progressing to min guard with intermittent UE support   Standing balance support: Bilateral upper extremity supported, Reliant on assistive device for balance, Single extremity supported Standing balance-Leahy Scale: Poor Standing balance comment: reliant on UE support and external assist to maintain standing balance  Cognition Arousal/Alertness: Awake/alert Behavior During Therapy: WFL for tasks assessed/performed Overall Cognitive Status:  Within Functional Limits for tasks assessed                                 General Comments: WFL for simple tasks, not formally assesesd. good sense of humor; sequencing mobility very well with minimal cuing        Exercises General Exercises - Lower Extremity Ankle Circles/Pumps: AROM, Right, Seated Long Arc Quad: AROM, Right, Seated Hip Flexion/Marching: AROM, Left, Seated, AAROM    General Comments General comments (skin integrity, edema, etc.):       Pertinent Vitals/Pain Pain Assessment Pain Assessment: Faces Faces Pain Scale: Hurts little more Pain Location: L hip with movement Pain Descriptors / Indicators: Discomfort, Grimacing, Guarding Pain Intervention(s): Monitored during session, Limited activity within patient's tolerance    Home Living Family/patient expects to be discharged to:: Private residence Living Arrangements: Spouse/significant other Available Help at Discharge: Family;Available PRN/intermittently (son that checks in regularly) Type of Home: House Home Access: Level entry     Alternate Level Stairs-Number of Steps: stair lift from basement entrance (level entry) to main level Home Layout: Two level Home Equipment: Agricultural consultant (2 wheels);Rollator (4 wheels);Wheelchair - Engineer, technical sales - power;Shower seat;Hand held shower head;Grab bars - tub/shower Additional Comments: wife mod indep with SPC; daughter and son live nearby, check on pt near daily    Prior Function            PT Goals (current goals can now be found in the care plan section) Progress towards PT goals: Progressing toward goals    Frequency    Min 5X/week      PT Plan Current plan remains appropriate    Co-evaluation   Reason for Co-Treatment: For patient/therapist safety;To address functional/ADL transfers PT goals addressed during session: Mobility/safety with mobility;Balance;Proper use of DME        AM-PAC PT "6 Clicks" Mobility   Outcome  Measure  Help needed turning from your back to your side while in a flat bed without using bedrails?: A Lot Help needed moving from lying on your back to sitting on the side of a flat bed without using bedrails?: A Lot Help needed moving to and from a bed to a chair (including a wheelchair)?: A Lot Help needed standing up from a chair using your arms (e.g., wheelchair or bedside chair)?: A Lot Help needed to walk in hospital room?: Total Help needed climbing 3-5 steps with a railing? : Total 6 Click Score: 10    End of Session Equipment Utilized During Treatment: Gait belt Activity Tolerance: Patient tolerated treatment well Patient left: in bed;with call bell/phone within reach;with nursing/sitter in room;Other (comment) (with transport tech) Nurse Communication: Mobility status PT Visit Diagnosis: Other abnormalities of gait and mobility (R26.89);Muscle weakness (generalized) (M62.81)     Time: 1610-9604 PT Time Calculation (min) (ACUTE ONLY): 13 min  Charges:  $Therapeutic Activity: 8-22 mins                     Ina Homes, PT, DPT Acute Rehabilitation Services  Personal: Secure Chat Rehab Office: [phone]  Malachy Chamber 12/02/2022, 5:12 PM

## 2022-12-02 NOTE — Progress Notes (Signed)
? ?  Inpatient Rehab Admissions Coordinator : ? ?Per therapy recommendations, patient was screened for CIR candidacy by Jedi Catalfamo RN MSN.  At this time patient appears to be a potential candidate for CIR. I will place a rehab consult per protocol for full assessment. Please call me with any questions. ? ?Annaliah Rivenbark RN MSN ?Admissions Coordinator ?[phone] ?  ?

## 2022-12-02 NOTE — Progress Notes (Signed)
Chart reviewed and patient would be an excellent CIR candidate- has family support from wife and several children who live locally, has demonstrated ability to tolerate 3 hours of daily therapy, has potential to discharge home in 2 weeks with goals of supervision. Has medical need due to recent left greater trochanteric fracture on 4/22 with significant co-morbidities of L BKA in 2019, HTN, type 2 diabetes, CKD 3, CAD s/p CABG, atrial fibrillation, BPH, and HLD. Patient is currently modA x2 in hopping on right lower extremity. Active medical issues that will be monitored and treated as needed are bradycardia, leukocytosis, anemia, thrombocytopenia, hyperglycemia secondary to type two diabetes, hyponatremia, hypoalbuminemia, hypocalcemia, and acute on chronic kidney injury.

## 2022-12-02 NOTE — PMR Pre-admission (Signed)
PMR Admission Coordinator Pre-Admission Assessment  Patient: Eric [name]. is an 89 y.o., male MRN: [id number] DOB: 1928-06-16 Height: 5\' 5"  (165.1 cm) Weight: 77.2 kg  Insurance Information HMO:     PPO: yes     PCP:      IPA:      80/20:      OTHER:  PRIMARY: [employer]      Policy#: [id number]      Subscriber: pt CM Name: Judeth Cornfield      Phone#: [phone]     Fax#: epic access Pre-Cert#: TBD      Employer:  per conversation between Dr. Riley Kill and Dr Logan Bores at [facility], Pt. Approved for CIR.  Benefits:  Phone #: [phone]     Name:  Eff. Date: 08/10/22     Deduct: $0      Out of Pocket Max: $3200 ($65 met)      Life Max:  CIR: $295/day for days 1-5      SNF: 20 full days Outpatient:      Co-Pay: $15/visit Home Health: 100%      Co-Pay:  DME: 80%     Co-Pay: 20% Providers:  SECONDARY:       Policy#:      Phone#:   Artist:       Phone#:   The Engineer, materials Information Summary" for patients in Inpatient Rehabilitation Facilities with attached "Privacy Act Statement-Health Care Records" was provided and verbally reviewed with: Patient and Family  Emergency Contact Information Contact Information     Name Relation Home Work Mobile   [place] Son   [phone]   Mohon,Lillie Spouse [phone]  [phone]   Mamie Laurel Daughter   [phone]   Verner, Mccrone   [phone]       Current Medical History  Patient Admitting Diagnosis: hip fracture   History of Present Illness: Pt is a 89 y/o male with PMH of L BKA (2019), HTN, DM, CKD, CAD s/p CABG, AF (on eliquis), BPH, and HLD.  He was admitted to [facility] on 11/30/22 following a fall at home precipitated by sudden hip pain in his L hip.  After lowering to the ground he was unable to stand 2/2 L hip pain.  Vitals in the ED unremarkable.  Labs notable for Hgb 10.9, creatinine 1.87.  Hip xray showed minimally displaced fracture through left femoral greater trochanter.  Orthopedics was  consulted and recommended CT, which showed greater trochanter fracture that is displaced by 1cm with no evidence of IT fracture. Recommended MRI for further assessment which showed acute fracture of the greater trochanter with superior displacement of fragments up to 2 cm and surrounding hematoma.  Orthopedics recommended nonoperative management and WBAT, with NO hip abduction.  Therapy ongoing and pt was recommended for CIR.      Patient's medical record from [facility] has been reviewed by the rehabilitation admission coordinator and physician.  Past Medical History  Past Medical History:  Diagnosis Date   Atrial flutter    s/p RFCA   CAD (coronary artery disease)    cath 2003, occluded S-RCA, occluded S-Dx, L-LAD ok, s/p PTCA to LAD   Cataract    CKD (chronic kidney disease) stage 3, GFR 30-59 ml/min    Diabetes mellitus    diet controlled   Hearing aid worn    bilateral   History of kidney stones    Long term (current) use of anticoagulants    Neuromuscular disorder    OSA (obstructive  sleep apnea) 12/11   very mild, AHI 7/hr   Persistent atrial fibrillation 09/26/2015   Pure hyperglyceridemia    PVD (peripheral vascular disease)    angioplasty of his right lower extremity in [place] by Dr.[name] 2013   Unspecified essential hypertension    Wears dentures    full upper and lower    Has the patient had major surgery during 100 days prior to admission? No  Family History   family history includes Aneurysm in his father; Breast cancer in his sister; Hypertension in his father and mother; Liver cancer in his brother; Lung cancer in his brother and brother; Other in his brother and sister; Prostate cancer in his brother; Stroke in his father; Thrombosis in his brother.  Current Medications  Current Facility-Administered Medications:    acetaminophen (TYLENOL) tablet 650 mg, 650 mg, Oral, Q6H PRN, Earlie Lou L, MD, 650 mg at 12/01/22 0929   amLODipine (NORVASC) tablet 10  mg, 10 mg, Oral, Daily, Mikeal Hawthorne, Mohammad L, MD, 10 mg at 12/01/22 0929   atenolol (TENORMIN) tablet 25 mg, 25 mg, Oral, Daily, Mikeal Hawthorne, Mohammad L, MD, 25 mg at 12/01/22 1610   ferrous sulfate tablet 325 mg, 325 mg, Oral, QHS, Garba, Mohammad L, MD, 325 mg at 12/01/22 2110   finasteride (PROSCAR) tablet 5 mg, 5 mg, Oral, Daily, Mikeal Hawthorne, Mohammad L, MD, 5 mg at 12/02/22 1108   hydrALAZINE (APRESOLINE) tablet 75 mg, 75 mg, Oral, BID, Mikeal Hawthorne, Mohammad L, MD, 75 mg at 12/01/22 2110   insulin aspart (novoLOG) injection 0-5 Units, 0-5 Units, Subcutaneous, QHS, Garba, Mohammad L, MD   insulin aspart (novoLOG) injection 0-9 Units, 0-9 Units, Subcutaneous, TID WC, Garba, Mohammad L, MD, 1 Units at 12/02/22 1122   morphine (PF) 2 MG/ML injection 2 mg, 2 mg, Intravenous, Q2H PRN, Earlie Lou L, MD, 2 mg at 12/01/22 1947   ondansetron (ZOFRAN) tablet 4 mg, 4 mg, Oral, Q6H PRN **OR** ondansetron (ZOFRAN) injection 4 mg, 4 mg, Intravenous, Q6H PRN, Mikeal Hawthorne, Mohammad L, MD, 4 mg at 12/01/22 1212   rosuvastatin (CRESTOR) tablet 40 mg, 40 mg, Oral, Daily, Garba, Mohammad L, MD, 40 mg at 12/02/22 1107   tamsulosin (FLOMAX) capsule 0.4 mg, 0.4 mg, Oral, Daily, Mikeal Hawthorne, Mohammad L, MD, 0.4 mg at 12/02/22 1108  Patients Current Diet:  Diet Order             Diet heart healthy/carb modified Room service appropriate? Yes; Fluid consistency: Thin  Diet effective now                   Precautions / Restrictions Precautions Precautions: Fall, Other (comment) Precaution Comments: h/o L BKA (does not have prosthetic in room) Restrictions Weight Bearing Restrictions: Yes LLE Weight Bearing: Weight bearing as tolerated Other Position/Activity Restrictions: per secure chat with ortho PA Charma Igo) 12/02/22 8A - confirmed LLE WBAT, no active hip abduction   Has the patient had 2 or more falls or a fall with injury in the past year? Yes  Prior Activity Level Limited Community (1-2x/wk): indep prior to admit,  driving, using a SPC and prosthetic for mobility, no assist for ADLs/iADLs  Prior Functional Level Self Care: Did the patient need help bathing, dressing, using the toilet or eating? Independent  Indoor Mobility: Did the patient need assistance with walking from room to room (with or without device)? Independent  Stairs: Did the patient need assistance with internal or external stairs (with or without device)? Independent  Functional Cognition: Did the patient need  help planning regular tasks such as shopping or remembering to take medications? Independent  Patient Information Are you of Hispanic, Latino/a,or Spanish origin?: A. No, not of Hispanic, Latino/a, or Spanish origin What is your race?: A. White Do you need or want an interpreter to communicate with a doctor or health care staff?: 0. No  Patient's Response To:  Health Literacy and Transportation Is the patient able to respond to health literacy and transportation needs?: Yes Health Literacy - How often do you need to have someone help you when you read instructions, pamphlets, or other written material from your doctor or pharmacy?: Never In the past 12 months, has lack of transportation kept you from medical appointments or from getting medications?: No In the past 12 months, has lack of transportation kept you from meetings, work, or from getting things needed for daily living?: No  Home Assistive Devices / Equipment Home Assistive Devices/Equipment: Medical laboratory scientific officer (specify quad or straight), Dentures (specify type), Eyeglasses, Prosthesis, Walker (specify type), Wheelchair Home Equipment: Agricultural consultant (2 wheels), Rollator (4 wheels), Wheelchair - manual, Wheelchair - power, Information systems manager, Hand held shower head, Grab bars - tub/shower  Prior Device Use: Indicate devices/aids used by the patient prior to current illness, exacerbation or injury? Walker, Orthotics/Prosthetics, and cane  Current Functional Level Cognition  Overall  Cognitive Status: Within Functional Limits for tasks assessed Orientation Level: Oriented X4 General Comments: WFL for simple tasks, not formally assesesd. good sense of humor; sequencing mobility very well with minimal cuing    Extremity Assessment (includes Sensation/Coordination)  Upper Extremity Assessment: Overall WFL for tasks assessed RUE Deficits / Details: h/o R hand numbness and noted arthritic changes in fingers  Lower Extremity Assessment: Defer to PT evaluation LLE Deficits / Details: h/o L BKA; hip and knee flex/ext >/ 3/5 strength    ADLs  Overall ADL's : Needs assistance/impaired Eating/Feeding: Set up, Sitting Grooming: Wash/dry hands, Wash/dry face, Set up, Sitting Grooming Details (indicate cue type and reason): EOB Upper Body Bathing: Moderate assistance Upper Body Bathing Details (indicate cue type and reason): for back Lower Body Bathing: Moderate assistance Lower Body Bathing Details (indicate cue type and reason): assist for knees down Upper Body Dressing : Moderate assistance, Sitting Upper Body Dressing Details (indicate cue type and reason): UE needed to support balance at this time Lower Body Dressing: Maximal assistance, Bed level, Sitting/lateral leans Toilet Transfer: Moderate assistance, +2 for physical assistance, +2 for safety/equipment, Stand-pivot, BSC/3in1, Rolling walker (2 wheels) Toilet Transfer Details (indicate cue type and reason): assist for boost Toileting- Clothing Manipulation and Hygiene: Maximal assistance Functional mobility during ADLs: Moderate assistance, +2 for physical assistance, +2 for safety/equipment, Cueing for safety, Cueing for sequencing, Rolling walker (2 wheels) General ADL Comments: decreased access to LB for ADL, pain, decreased knowledge of AE, balance    Mobility  Overal bed mobility: Needs Assistance Bed Mobility: Supine to Sit Supine to sit: Mod assist, +2 for safety/equipment, HOB elevated General bed mobility  comments: modA+2 for trunk elevation and scooting L hip towards EOB, increased time and effort secondary to pain    Transfers  Overall transfer level: Needs assistance Equipment used: Rolling walker (2 wheels) Transfers: Sit to/from Stand, Bed to chair/wheelchair/BSC Sit to Stand: Mod assist, +2 physical assistance Bed to/from chair/wheelchair/BSC transfer type:: Step pivot Step pivot transfers: Mod assist, +2 physical assistance General transfer comment: pt able to stand on second attempt from elevated bed height to RW with modA+2, pt able to minimally hop on RLE with modA for  stability and RW management in order to hop pivotally from bed>recliner; modA for eccentric control to sit    Ambulation / Gait / Stairs / Wheelchair Mobility       Posture / Balance Dynamic Sitting Balance Sitting balance - Comments: initial modA for sitting balance progressing to min guard with intermittent UE support Balance Overall balance assessment: Needs assistance Sitting balance-Leahy Scale: Fair Sitting balance - Comments: initial modA for sitting balance progressing to min guard with intermittent UE support Standing balance support: Bilateral upper extremity supported, Reliant on assistive device for balance Standing balance-Leahy Scale: Poor Standing balance comment: reliant on BUE support and external assist standing with RW on RLE    Special needs/care consideration Diabetic management yes   Previous Home Environment (from acute therapy documentation) Living Arrangements: Spouse/significant other Available Help at Discharge: Family, Available PRN/intermittently (son that checks in regularly) Type of Home: House Home Layout: Two level Alternate Level Stairs-Number of Steps: stair lift from basement entrance (level entry) to main level Home Access: Level entry Bathroom Shower/Tub: Health visitor: Handicapped height Home Care Services: Yes Type of Home Care Services: Home RN Home  Care Agency (if known): [facility] Additional Comments: wife mod indep with SPC; daughter and son live nearby, check on pt near daily  Discharge Living Setting Plans for Discharge Living Setting: Patient's home, Lives with (comment) (spouse) Type of Home at Discharge: House Discharge Home Layout: Two level, Able to live on main level with bedroom/bathroom, Other (Comment) (stair lift from basement to main level) Discharge Home Access: Level entry Discharge Bathroom Shower/Tub: Walk-in shower Discharge Bathroom Toilet: Handicapped height Discharge Bathroom Accessibility: Yes How Accessible: Accessible via walker Does the patient have any problems obtaining your medications?: No  Social/Family/Support Systems Patient Roles: Spouse Anticipated Caregiver: sons/daughters/spouse Anticipated Caregiver's Contact Information: [name] (main contact [phone]); Tresa Endo (son) [phone]; Clydie Braun (dtr) [phone] Ability/Limitations of Caregiver: spouse cannot provide assist, children can provide supervision if needed Caregiver Availability: 24/7 Discharge Plan Discussed with Primary Caregiver: Yes Is Caregiver In Agreement with Plan?: Yes Does Caregiver/Family have Issues with Lodging/Transportation while Pt is in Rehab?: No  Goals Patient/Family Goal for Rehab: PT/OT supervision to mod I, SLP n/a Expected length of stay: 10-12 days Additional Information: Discharge plan: return to pt's home where he lives with his spouse, they have several children in the area who can provide 24/7 supervision if needed at discharge Pt/Family Agrees to Admission and willing to participate: Yes Program Orientation Provided & Reviewed with Pt/Caregiver Including Roles  & Responsibilities: Yes Additional Information Needs: Dr. Carlis Abbott reviewed on 12/02/22  Decrease burden of Care through IP rehab admission: n/a  Possible need for SNF placement upon discharge: Not anticipated.  Plan for d/c to pt's home at  intermittent mod I level with spouse and pt's children planning to provide any supervision needed.   Patient Condition: I have reviewed medical records from [facility], spoken with CM, and patient and son. I met with patient at the bedside for inpatient rehabilitation assessment.  Patient will benefit from ongoing PT and OT, can actively participate in 3 hours of therapy a day 5 days of the week, and can make measurable gains during the admission.  Patient will also benefit from the coordinated team approach during an Inpatient Acute Rehabilitation admission.  The patient will receive intensive therapy as well as Rehabilitation physician, nursing, social worker, and care management interventions.  Due to safety, disease management, medication administration, pain management, and patient education the patient requires 24 hour  a day rehabilitation nursing.  The patient is currently mod assist with mobility and basic ADLs.  Discharge setting and therapy post discharge at home with home health is anticipated.  Patient has agreed to participate in the Acute Inpatient Rehabilitation Program and will admit today.  Preadmission Screen Completed By:  Stephania Fragmin, PT, DPT 12/02/2022 4:25 PM ______________________________________________________________________   Discussed status with Dr. Riley Kill on 12/04/22  at 11:36 AM  and received approval for admission today.  Admission Coordinator:  Stephania Fragmin, PT, DPT time 11:36 AM Dorna Bloom 12/04/22    Assessment/Plan: Diagnosis: left greater troch hip fx Does the need for close, 24 hr/day Medical supervision in concert with the patient's rehab needs make it unreasonable for this patient to be served in a less intensive setting? Yes Co-Morbidities requiring supervision/potential complications: Left BKA with prosthesis, HTN, DM, CKD, bac uti Due to bladder management, bowel management, safety, skin/wound care, disease management, medication administration, pain  management, and patient education, does the patient require 24 hr/day rehab nursing? Yes Does the patient require coordinated care of a physician, rehab nurse, PT, OT to address physical and functional deficits in the context of the above medical diagnosis(es)? Yes Addressing deficits in the following areas: balance, endurance, locomotion, strength, transferring, bowel/bladder control, bathing, dressing, feeding, grooming, toileting, and psychosocial support Can the patient actively participate in an intensive therapy program of at least 3 hrs of therapy 5 days a week? Yes The potential for patient to make measurable gains while on inpatient rehab is excellent Anticipated functional outcomes upon discharge from inpatient rehab: modified independent and supervision PT, modified independent and supervision OT, modified independent and supervision SLP Estimated rehab length of stay to reach the above functional goals is: 10 days Anticipated discharge destination: Home 10. Overall Rehab/Functional Prognosis: excellent   MD Signature: Ranelle Oyster, MD, [facility] Physical Medicine & Rehabilitation Medical Director Rehabilitation Services 12/04/2022

## 2022-12-02 NOTE — Progress Notes (Signed)
PT Cancellation Note  Patient Details Name: Eric [name]. MRN: [id number] DOB: 1927-09-17   Cancelled Treatment:    Reason Eval/Treat Not Completed: Patient awaiting MRI to eval for occult IT fracture. Per ortho note, " As long as it's confined to the greater troch he may be WBAT with no active hip abduction." Will wait for MRI results and confirm WB/ROM restrictions before proceeding with PT evaluation.  Ina Homes, PT, DPT Acute Rehabilitation Services  Personal: Secure Chat Rehab Office: [phone]  Malachy Chamber 12/02/2022, 7:47 AM

## 2022-12-03 DIAGNOSIS — S72112A Displaced fracture of greater trochanter of left femur, initial encounter for closed fracture: Secondary | ICD-10-CM

## 2022-12-03 LAB — GLUCOSE, CAPILLARY
Glucose-Capillary: 98 mg/dL (ref 70–99)
Glucose-Capillary: 160 mg/dL — ABNORMAL HIGH (ref 70–99)
Glucose-Capillary: 101 mg/dL — ABNORMAL HIGH (ref 70–99)
Glucose-Capillary: 148 mg/dL — ABNORMAL HIGH (ref 70–99)

## 2022-12-03 LAB — BASIC METABOLIC PANEL
Anion gap: 8 (ref 5–15)
BUN: 27 mg/dL — ABNORMAL HIGH (ref 8–23)
CO2: 25 mmol/L (ref 22–32)
Calcium: 8.6 mg/dL — ABNORMAL LOW (ref 8.9–10.3)
Chloride: 101 mmol/L (ref 98–111)
Creatinine, Ser: 1.31 mg/dL — ABNORMAL HIGH (ref 0.61–1.24)
GFR, Estimated: 51 mL/min — ABNORMAL LOW (ref >60)
Glucose, Bld: 93 mg/dL (ref 70–99)
Potassium: 4.4 mmol/L (ref 3.5–5.1)
Sodium: 134 mmol/L — ABNORMAL LOW (ref 135–145)

## 2022-12-03 LAB — CBC
HCT: 29.9 % — ABNORMAL LOW (ref 39.0–52.0)
Hemoglobin: 9.4 g/dL — ABNORMAL LOW (ref 13.0–17.0)
MCH: 29.2 pg (ref 26.0–34.0)
MCHC: 31.4 g/dL (ref 30.0–36.0)
MCV: 92.9 fL (ref 80.0–100.0)
Platelets: 139 10*3/uL — ABNORMAL LOW (ref 150–400)
RBC: 3.22 MIL/uL — ABNORMAL LOW (ref 4.22–5.81)
RDW: 16.2 % — ABNORMAL HIGH (ref 11.5–15.5)
WBC: 11.5 10*3/uL — ABNORMAL HIGH (ref 4.0–10.5)
nRBC: 0 % (ref 0.0–0.2)

## 2022-12-03 MED ORDER — HYDROCODONE-ACETAMINOPHEN 5-325 MG PO TABS: 1.0000 | ORAL_TABLET | Freq: Four times a day (QID) | ORAL | 0 refills | Status: DC | PRN

## 2022-12-03 MED ORDER — PENTOXIFYLLINE ER 400 MG PO TBCR
400.0000 mg | EXTENDED_RELEASE_TABLET | Freq: Two times a day (BID) | ORAL | Status: DC
  Administered 2022-12-03 – 2022-12-04 (×2): 400 mg via ORAL
  Filled 2022-12-03 (×4): qty 1

## 2022-12-03 MED ORDER — VITAMIN B-12 1000 MCG PO TABS
500.0000 ug | ORAL_TABLET | Freq: Every day | ORAL | Status: DC
  Administered 2022-12-03 – 2022-12-04 (×2): 500 ug via ORAL
  Filled 2022-12-03 (×2): qty 1

## 2022-12-03 MED ORDER — APIXABAN 2.5 MG PO TABS
2.5000 mg | ORAL_TABLET | Freq: Two times a day (BID) | ORAL | Status: DC
  Administered 2022-12-03 – 2022-12-04 (×3): 2.5 mg via ORAL
  Filled 2022-12-03 (×3): qty 1

## 2022-12-03 NOTE — Progress Notes (Signed)
Inpatient Rehab Admissions Coordinator:   Awaiting determination from [facility] regarding prior auth request for CIR.    Estill Dooms, PT, DPT Admissions Coordinator [phone] 12/03/22  10:00 AM

## 2022-12-03 NOTE — Progress Notes (Signed)
Inpatient Rehab Admissions Coordinator:   [facility] requested peer to peer which Dr. Berline Chough completed with their medical director.  He said he would reconsider pending therapy session with prosthesis.  When I went to update pt/family he was working with PT already and prosthesis was donned.  Will f/u once note is in.   Estill Dooms, PT, DPT Admissions Coordinator [phone] 12/03/22  2:08 PM

## 2022-12-03 NOTE — Progress Notes (Signed)
PROGRESS NOTE Eric [name].  ZOX:[id number] DOB: Apr 21, 1928 DOA: 11/30/2022 PCP: [name], MD  Brief Narrative/Hospital Course: 89 y.o. male with medical history significant of diet-controlled diabetes, coronary artery disease, essential hypertension, obstructive sleep apnea, peripheral vascular disease status post BKA on the left, essential hypertension, atrial fibrillation presented with mechanical fall and left hip pain.  He was found to have nondisplaced fracture of the left greater trochanter.  Orthopedics was consulted  and admitted. Seen by Dr. Victorino Dike had MRI of the left hip showing acute fracture of the greater trochanter with superior displacement up to 2 cm, mild bilateral hip osteoarthritis no other fractures.  Discussed Dr. Victorino Dike, advised to resume Eliquis and weightbearing as tolerated.  PT OT has evaluated and recommending inpatient rehab.    Subjective: Seen and examined this morning resting comfortably no pain while not moving.   Son is at the bedside.     Assessment and Plan: Principal Problem:   Closed left femoral fracture Active Problems:   Essential hypertension   Obstructive sleep apnea   Chronic coronary artery disease   Type 2 diabetes mellitus   PVD (peripheral vascular disease) (HCC)   Chronic anticoagulation   Atrial flutter   Hx of BKA, left   Acute kidney injury superimposed on CKD   Acute left greater trochanter fracture nondisplaced secondary to fall mechanical w/ lt BKA: Seen by Dr. Victorino Dike had MRI of the left hip showing acute fracture of the greater trochanter with superior displacement up to 2 cm, mild bilateral hip osteoarthritis no other fractures.  Discussed Dr. Victorino Dike, advised to resume Eliquis and weightbearing as tolerated.  PT OT has evaluated and recommending inpatient rehab. Continue pain management with oral and IV opiates and nonopiate, bowel regimen.  Chronic atrial fibrillation--rate controlled.  Eliquis resumed, Cont  atenolol   Hypertension Hyperlipidemia CAD with history of CABG: BP well-controlled, no chest pain.  Continue amlodipine, atenolol, hydralazine as BP tolerate. Continue statin. Lasix on hold will resume on discharge   AKI on chronic kidney disease stage IIIb:Creatinine at 1.8>1.6>>1.3.  Encourage oral hydration  Recent Labs    08/06/22 1045 09/28/22 1024 10/05/22 1412 10/19/22 1422 11/16/22 1113 11/23/22 1003 11/30/22 2050 12/01/22 0645 12/02/22 0245 12/03/22 0306  BUN 31* 19 29* 33* 30* 30* 27*  CREATININE 1.18 2.50* 1.21 1.71* 1.35* 1.70* 1.87* 1.60* 1.43* 1.31*    BPH--continue tamsulosin   T2DM:continue SSI. Stable sugar. Recent Labs  Lab 12/02/22 1114 12/02/22 1621 12/02/22 2051 12/03/22 0731 12/03/22 1119  GLUCAP 126* 114* 180* 98 160*    Leukocytosis:Possibly reactive. Resolving Recent Labs  Lab 11/30/22 2050 12/01/22 0645 12/02/22 0245 12/03/22 0306  WBC 9.4 11.1* 12.3* 11.5*     Anemia chronic disease:From chronic illnesses.  Hemoglobin stable.  Monitor intermittently  Mild thrombocytopenia in the setting of acute illness monitor  Peripheral arterial disease status post left WJX:BJYNWGN will be resumed  DVT prophylaxis: SCDs Start: 12/01/22 0353 Code Status:   Code Status: Full Code Family Communication: plan of care discussed with patient at bedside. Patient status is: Inpatient because of due to fracture and fall Level of care: Med-Surg   Dispo: The patient is from: home            Anticipated disposition: TBD Objective: Vitals last 24 hrs: Vitals:   12/02/22 1500 12/02/22 2050 12/03/22 0425 12/03/22 0732  BP: (!) 124/52 (!) 138/51 (!) 141/55 (!) 149/56  Pulse: (!) 58 62 (!) 55 (!) 57  Resp: 18  Temp: 98.9 F (37.2 C) 98.3 F (36.8 C) 98 F (36.7 C) 98.6 F (37 C)  TempSrc: Oral   Oral  SpO2:  93% 96% 95%  Weight:      Height:       Weight change:   Physical Examination: General exam: AAox3, weak,older  appearing HEENT:Oral mucosa moist, Ear/Nose WNL grossly, dentition normal. Respiratory system: bilaterally clear BS, no use of accessory muscle Cardiovascular system: S1 & S2 +, regular rate. Gastrointestinal system: Abdomen soft, NT,ND,BS+ Nervous System:Alert, awake, moving extremities and grossly nonfocal Extremities: LE ankle edema neg, lower extremities warm Skin: No rashes,no icterus. MSK: Normal muscle bulk,tone, power   Medications reviewed:  Scheduled Meds:  amLODipine  10 mg Oral Daily   atenolol  25 mg Oral Daily   ferrous sulfate  325 mg Oral QHS   finasteride  5 mg Oral Daily   hydrALAZINE  75 mg Oral BID   insulin aspart  0-5 Units Subcutaneous QHS   insulin aspart  0-9 Units Subcutaneous TID WC   rosuvastatin  40 mg Oral Daily   tamsulosin  0.4 mg Oral Daily   Continuous Infusions:    Diet Order             Diet heart healthy/carb modified Room service appropriate? Yes; Fluid consistency: Thin  Diet effective now                   Intake/Output Summary (Last 24 hours) at 12/03/2022 1136 Last data filed at 12/03/2022 0700 Gross per 24 hour  Intake 446.92 ml  Output 1900 ml  Net -1453.08 ml   Net IO Since Admission: 1,991.03 mL [12/03/22 1136]  Wt Readings from Last 3 Encounters:  12/01/22 77.2 kg  11/16/22 75.7 kg  09/28/22 75.6 kg     Unresulted Labs (From admission, onward)     Start     Ordered   12/03/22 0500  Basic metabolic panel  Daily,   R      12/02/22 1135   12/03/22 0500  CBC  Daily,   R      12/02/22 1135          Data Reviewed: I have personally reviewed following labs and imaging studies CBC: Recent Labs  Lab 11/30/22 2050 12/01/22 0645 12/02/22 0245 12/03/22 0306  WBC 9.4 11.1* 12.3* 11.5*  NEUTROABS 5.5  --  7.2  --   HGB 10.9* 10.2* 9.8* 9.4*  HCT 34.0* 32.7* 29.4* 29.9*  MCV 92.9 93.2 91.9 92.9  PLT 178 158 136* 139*   Basic Metabolic Panel: Recent Labs  Lab 11/30/22 2050 12/01/22 0645 12/02/22 0245  12/03/22 0306  NA 138 139 133* 134*  K 3.7 3.7 4.6 4.4  CL 102 107 101 101  CO2 GLUCOSE 124* 80 92 93  BUN 33* 30* 30* 27*  CREATININE 1.87* 1.60* 1.43* 1.31*  CALCIUM 8.9 8.6* 8.6* 8.6*  MG  --   --  1.7  --    GFR: Estimated Creatinine Clearance: 34.5 mL/min (A) (by C-G formula based on SCr of 1.31 mg/dL (H)). Liver Function Tests: Recent Labs  Lab 12/01/22 0645  AST 24  ALT 20  ALKPHOS 44  BILITOT 0.9  PROT 5.8*  ALBUMIN 2.7*  BNP (last 3 results) Recent Labs    09/28/22 1024 10/05/22 1412  PROBNP 2,173* 2,429*   HbA1C: No results for input(s): "HGBA1C" in the last 72 hours. CBG: Recent Labs  Lab  12/02/22 1114 12/02/22 1621 12/02/22 2051 12/03/22 0731 12/03/22 1119  GLUCAP 126* 114* 180* 98 160*   Recent Results (from the past 240 hour(s))  Urine Culture     Status: Abnormal (Preliminary result)   Collection Time: 12/01/22  6:57 PM   Specimen: Urine, Catheterized  Result Value Ref Range Status   Specimen Description URINE, CATHETERIZED  Final   Special Requests NONE  Final   Culture (A)  Final    >=100,000 COLONIES/mL GRAM NEGATIVE RODS IDENTIFICATION AND SUSCEPTIBILITIES TO FOLLOW CULTURE REINCUBATED FOR BETTER GROWTH Performed at [facility], [address], [place], [place] [address]    Report Status PENDING  Incomplete    Antimicrobials: Anti-infectives (From admission, onward)    None      Culture/Microbiology None this admission  MR HIP LEFT WO CONTRAST  Result Date: 12/02/2022 CLINICAL DATA:  The patient suffered a fall with onset of left hip pain he was getting up a chair. EXAM: MR OF THE LEFT HIP WITHOUT CONTRAST TECHNIQUE: Multiplanar, multisequence MR imaging was performed. No intravenous contrast was administered. COMPARISON:  Plain films and CT left hip 11/30/2022. FINDINGS: Bones: As on the prior exams, the patient has an acute fracture of the greater trochanter with associated hematoma formation. The fracture  does not extend into the femoral neck or intertrochanteric femur. Fracture fragments are superiorly displaced up to approximately 2 cm. No other fracture is identified. There is mild degenerative change about the hips. Articular cartilage and labrum Articular cartilage:  Thinned without focal defect. Labrum:  Intact. Joint or bursal effusion Joint effusion:  None. Bursae: Negative. Muscles and tendons Muscles and tendons: Intact. The gluteus medius tendon and attaches to a greater trochanter fracture fragment. Other findings Miscellaneous:   None. IMPRESSION: 1. Acute fracture of the greater trochanter with superior displacement of fracture fragments up to 2 cm and surrounding hematoma. Negative for extension into the femoral neck or intertrochanteric femur. 2. Mild bilateral hip osteoarthritis. Electronically Signed   By: Drusilla Kanner M.D.   On: 12/02/2022 11:54     LOS: 3 days   Lanae Boast, MD [facility]  12/03/2022, 11:36 AM

## 2022-12-03 NOTE — Progress Notes (Addendum)
Subjective: Pt denies any pain while sitting still.  He has worn his prosthesis for PT.  Son at bedside.  HD 3 for L hip fracture.  NWB so far pending MRI results.   Objective: Vital signs in last 24 hours: Temp:  [98 F (36.7 C)-98.9 F (37.2 C)] 98.6 F (37 C) (04/25 0732) Pulse Rate:  [55-62] 57 (04/25 0732) Resp:  [17-19] 18 (04/25 0732) BP: (124-149)/(51-56) 149/56 (04/25 0732) SpO2:  [93 %-96 %] 95 % (04/25 0732)  Intake/Output from previous day: 04/24 0701 - 04/25 0700 In: 2265.2 [P.O.:600; I.V.:1665.2] Out: 2200 [Urine:2200] Intake/Output this shift: No intake/output data recorded.  Recent Labs    11/30/22 2050 12/01/22 0645 12/02/22 0245 12/03/22 0306  HGB 10.9* 10.2* 9.8* 9.4*   Recent Labs    12/02/22 0245 12/03/22 0306  WBC 12.3* 11.5*  RBC 3.20* 3.22*  HCT 29.4* 29.9*  PLT 136* 139*   Recent Labs    12/02/22 0245 12/03/22 0306  NA 133* 134*  K 4.6 4.4  CL 101 101  CO2 24 25  BUN 30* 27*  CREATININE 1.43* 1.31*  GLUCOSE 92 93  CALCIUM 8.6* 8.6*   No results for input(s): "LABPT", "INR" in the last 72 hours.  PE:  wn wd male in nad.  L LE prosthesis fitting well.    Assessment/Plan: Left hip greater trochanteric fracture - MRI shows no IT or subtroch component to the fracture.  He is safe to bear weight as tolerated with his prosthesis.  OK to resume WB in PT.  OK to restart Eliquis.  D/w Dr. Jonathon Bellows.  OK to discharge to rehab or SNF when bed available.  F/u with me in the office in two to three weeks.  I'll sign off.  Please call [phone] with any questions.     Toni Arthurs 12/03/2022, 10:07 AM

## 2022-12-03 NOTE — Progress Notes (Signed)
Physical Therapy Treatment Patient Details Name: Eric [name]. MRN: [id number] DOB: May 16, 1928 Today's Date: 12/03/2022   History of Present Illness Pt is a 89 y.o. male admitted 11/30/22 after sudden onset L hip pain resulted in fall causing L greater trochanteric fx. Per ortho, plan for non-operative management WBAT with no active hip abduction. PMH includes L BKA (2019), HTN, DM2, CKD 3, CAD s/p CABG, afib on Eliquis, BPH, HLD.    PT Comments    Excellent session; managed to donne LLE prosthesis on EOB at a min guard level with very little assistance with set-up. Stood with mod assist from bed, and min assist from recliner today. Tolerated upright stance with prosthesis nearly 10 minutes focusing on weight shift and balance. Able to standing unsupported several times up to 30 seconds. Reviewed precautions (WBAT with no active Left hip abduction.) Son present and very supportive during session. Pt highly motivated and eager to work with therapies. Likely tolerate pre-gait and early gait training next visit. Patient will continue to benefit from skilled physical therapy services to further improve independence with functional mobility.  Patient will benefit from intensive inpatient follow up therapy, >3 hours/day    Recommendations for follow up therapy are one component of a multi-disciplinary discharge planning process, led by the attending physician.  Recommendations may be updated based on patient status, additional functional criteria and insurance authorization.     Assistance Recommended at Discharge Frequent or constant Supervision/Assistance  Patient can return home with the following Assistance with cooking/housework;Assist for transportation;Help with stairs or ramp for entrance;A little help with walking and/or transfers;A little help with bathing/dressing/bathroom   Equipment Recommendations   (defer to next venue; well-equipped)    Recommendations for Other Services        Precautions / Restrictions Precautions Precautions: Fall;Other (comment) Precaution Comments: h/o L BKA (does have prosthetic in room) Restrictions Weight Bearing Restrictions: Yes LLE Weight Bearing: Weight bearing as tolerated Other Position/Activity Restrictions: per secure chat with ortho PA Charma Igo) 12/02/22 8A - confirmed LLE WBAT, no active hip abduction     Mobility  Bed Mobility Overal bed mobility: Needs Assistance Bed Mobility: Supine to Sit     Supine to sit: Mod assist, HOB elevated     General bed mobility comments: Mod assist for LLE support (very little) and trunk support to rise to EOB. Cues for technique throughout. unable to tolerate roll towards Rt side to utilize rail yet.    Transfers Overall transfer level: Needs assistance Equipment used: Rolling walker (2 wheels) Transfers: Sit to/from Stand, Bed to chair/wheelchair/BSC Sit to Stand: Mod assist, From elevated surface Stand pivot transfers: Min assist         General transfer comment: Mod, nearing min assist for sit<>stand transfer performed from bed and recliner today with cues for technique, while wearing LLE prosthesis. Pivots on Rt foot with notable amount of effort, cues for technique and sequencing with increased UE support to unload RLE for pivot. Tolerated min/no weight through LLE. Cues for precautions to preven active hip abduction.    Ambulation/Gait                   Stairs             Wheelchair Mobility    Modified Rankin (Stroke Patients Only)       Balance Overall balance assessment: Needs assistance Sitting-balance support: No upper extremity supported Sitting balance-Leahy Scale: Fair Sitting balance - Comments: Sat EOB with min guard to  donne LLE prosthesis with minimal assist for set-up   Standing balance support: No upper extremity supported, During functional activity Standing balance-Leahy Scale: Fair Standing balance comment: Stood for up to  30 seconds without AD support, prosthesis was on.                            Cognition Arousal/Alertness: Awake/alert Behavior During Therapy: WFL for tasks assessed/performed Overall Cognitive Status: Within Functional Limits for tasks assessed                                          Exercises Other Exercises Other Exercises: Standing balance and tolerance. Upwards of nearly 10 minutes, weight shifting, bending knees slightly, unsupported at times. Min guard for safety.    General Comments General comments (skin integrity, edema, etc.): Reviewed precautions.      Pertinent Vitals/Pain Pain Assessment Pain Assessment: Faces Faces Pain Scale: Hurts even more Pain Location: L hip with movement Pain Descriptors / Indicators: Discomfort, Grimacing, Guarding Pain Intervention(s): Limited activity within patient's tolerance, Monitored during session, Premedicated before session, Repositioned    Home Living                          Prior Function            PT Goals (current goals can now be found in the care plan section) Acute Rehab PT Goals Patient Stated Goal: regain independence PT Goal Formulation: With patient Time For Goal Achievement: 12/16/22 Potential to Achieve Goals: Good Progress towards PT goals: Progressing toward goals    Frequency    Min 5X/week      PT Plan Current plan remains appropriate    Co-evaluation              AM-PAC PT "6 Clicks" Mobility   Outcome Measure  Help needed turning from your back to your side while in a flat bed without using bedrails?: A Lot Help needed moving from lying on your back to sitting on the side of a flat bed without using bedrails?: A Lot Help needed moving to and from a bed to a chair (including a wheelchair)?: A Little Help needed standing up from a chair using your arms (e.g., wheelchair or bedside chair)?: A Little Help needed to walk in hospital room?:  Total Help needed climbing 3-5 steps with a railing? : Total 6 Click Score: 12    End of Session Equipment Utilized During Treatment: Gait belt;Other (comment) (LLE prosthesis) Activity Tolerance: Patient tolerated treatment well Patient left: with call bell/phone within reach;in chair;with chair alarm set;with family/visitor present;with SCD's reapplied Nurse Communication: Mobility status PT Visit Diagnosis: Other abnormalities of gait and mobility (R26.89);Muscle weakness (generalized) (M62.81);Difficulty in walking, not elsewhere classified (R26.2);Pain Pain - Right/Left: Left Pain - part of body: Hip     Time: 1237-1309 PT Time Calculation (min) (ACUTE ONLY): 32 min  Charges:  $Therapeutic Activity: 8-22 mins $Neuromuscular Re-education: 8-22 mins                     Kathlyn Sacramento, PT, DPT Physical Therapist Acute Rehabilitation Services  [facility] [address] [place], [place] [address]  [phone] office [phone] fax [email or web address]     12/03/2022, 2:19 PM

## 2022-12-04 ENCOUNTER — Inpatient Hospital Stay (HOSPITAL_COMMUNITY)
Admission: RE | Admit: 2022-12-04 | Discharge: 2022-12-18 | DRG: 560 | Disposition: A | Discharge status: Home Health | Payer: PPO | Source: Intra-hospital | Attending: Physical Medicine and Rehabilitation | Admitting: Physical Medicine and Rehabilitation

## 2022-12-04 ENCOUNTER — Other Ambulatory Visit: Payer: Self-pay

## 2022-12-04 DIAGNOSIS — S72115D Nondisplaced fracture of greater trochanter of left femur, subsequent encounter for closed fracture with routine healing: Secondary | ICD-10-CM

## 2022-12-04 DIAGNOSIS — Z803 Family history of malignant neoplasm of breast: Secondary | ICD-10-CM

## 2022-12-04 DIAGNOSIS — I4819 Other persistent atrial fibrillation: Secondary | ICD-10-CM | POA: Diagnosis not present

## 2022-12-04 DIAGNOSIS — R339 Retention of urine, unspecified: Secondary | ICD-10-CM | POA: Diagnosis not present

## 2022-12-04 DIAGNOSIS — N401 Enlarged prostate with lower urinary tract symptoms: Secondary | ICD-10-CM | POA: Diagnosis not present

## 2022-12-04 DIAGNOSIS — R338 Other retention of urine: Secondary | ICD-10-CM | POA: Diagnosis not present

## 2022-12-04 DIAGNOSIS — Z801 Family history of malignant neoplasm of trachea, bronchus and lung: Secondary | ICD-10-CM | POA: Diagnosis not present

## 2022-12-04 DIAGNOSIS — Z823 Family history of stroke: Secondary | ICD-10-CM

## 2022-12-04 DIAGNOSIS — D649 Anemia, unspecified: Secondary | ICD-10-CM | POA: Diagnosis not present

## 2022-12-04 DIAGNOSIS — S72102D Unspecified trochanteric fracture of left femur, subsequent encounter for closed fracture with routine healing: Secondary | ICD-10-CM

## 2022-12-04 DIAGNOSIS — Z7901 Long term (current) use of anticoagulants: Secondary | ICD-10-CM | POA: Diagnosis not present

## 2022-12-04 DIAGNOSIS — Z89512 Acquired absence of left leg below knee: Secondary | ICD-10-CM | POA: Diagnosis not present

## 2022-12-04 DIAGNOSIS — N390 Urinary tract infection, site not specified: Secondary | ICD-10-CM | POA: Diagnosis present

## 2022-12-04 DIAGNOSIS — N179 Acute kidney failure, unspecified: Secondary | ICD-10-CM | POA: Diagnosis present

## 2022-12-04 DIAGNOSIS — Z87891 Personal history of nicotine dependence: Secondary | ICD-10-CM | POA: Diagnosis not present

## 2022-12-04 DIAGNOSIS — M25552 Pain in left hip: Secondary | ICD-10-CM | POA: Diagnosis not present

## 2022-12-04 DIAGNOSIS — Z951 Presence of aortocoronary bypass graft: Secondary | ICD-10-CM

## 2022-12-04 DIAGNOSIS — G4700 Insomnia, unspecified: Secondary | ICD-10-CM | POA: Diagnosis not present

## 2022-12-04 DIAGNOSIS — E1151 Type 2 diabetes mellitus with diabetic peripheral angiopathy without gangrene: Secondary | ICD-10-CM | POA: Diagnosis present

## 2022-12-04 DIAGNOSIS — S72112A Displaced fracture of greater trochanter of left femur, initial encounter for closed fracture: Secondary | ICD-10-CM | POA: Diagnosis not present

## 2022-12-04 DIAGNOSIS — E781 Pure hyperglyceridemia: Secondary | ICD-10-CM | POA: Diagnosis present

## 2022-12-04 DIAGNOSIS — A499 Bacterial infection, unspecified: Secondary | ICD-10-CM

## 2022-12-04 DIAGNOSIS — Z79899 Other long term (current) drug therapy: Secondary | ICD-10-CM

## 2022-12-04 DIAGNOSIS — I129 Hypertensive chronic kidney disease with stage 1 through stage 4 chronic kidney disease, or unspecified chronic kidney disease: Secondary | ICD-10-CM | POA: Diagnosis not present

## 2022-12-04 DIAGNOSIS — R0602 Shortness of breath: Secondary | ICD-10-CM | POA: Diagnosis not present

## 2022-12-04 DIAGNOSIS — G4733 Obstructive sleep apnea (adult) (pediatric): Secondary | ICD-10-CM | POA: Diagnosis not present

## 2022-12-04 DIAGNOSIS — E1122 Type 2 diabetes mellitus with diabetic chronic kidney disease: Secondary | ICD-10-CM | POA: Diagnosis not present

## 2022-12-04 DIAGNOSIS — I482 Chronic atrial fibrillation, unspecified: Secondary | ICD-10-CM

## 2022-12-04 DIAGNOSIS — N183 Chronic kidney disease, stage 3 unspecified: Secondary | ICD-10-CM | POA: Diagnosis present

## 2022-12-04 DIAGNOSIS — I10 Essential (primary) hypertension: Secondary | ICD-10-CM

## 2022-12-04 DIAGNOSIS — Z888 Allergy status to other drugs, medicaments and biological substances status: Secondary | ICD-10-CM

## 2022-12-04 DIAGNOSIS — J90 Pleural effusion, not elsewhere classified: Secondary | ICD-10-CM | POA: Diagnosis not present

## 2022-12-04 DIAGNOSIS — Z9861 Coronary angioplasty status: Secondary | ICD-10-CM

## 2022-12-04 DIAGNOSIS — S72112D Displaced fracture of greater trochanter of left femur, subsequent encounter for closed fracture with routine healing: Secondary | ICD-10-CM | POA: Diagnosis not present

## 2022-12-04 DIAGNOSIS — S72112S Displaced fracture of greater trochanter of left femur, sequela: Secondary | ICD-10-CM | POA: Diagnosis not present

## 2022-12-04 DIAGNOSIS — F54 Psychological and behavioral factors associated with disorders or diseases classified elsewhere: Secondary | ICD-10-CM

## 2022-12-04 DIAGNOSIS — I2510 Atherosclerotic heart disease of native coronary artery without angina pectoris: Secondary | ICD-10-CM | POA: Diagnosis present

## 2022-12-04 DIAGNOSIS — Z800 Family history of malignant neoplasm of digestive organs: Secondary | ICD-10-CM

## 2022-12-04 DIAGNOSIS — B9620 Unspecified Escherichia coli [E. coli] as the cause of diseases classified elsewhere: Secondary | ICD-10-CM | POA: Diagnosis not present

## 2022-12-04 DIAGNOSIS — S72113A Displaced fracture of greater trochanter of unspecified femur, initial encounter for closed fracture: Principal | ICD-10-CM | POA: Diagnosis present

## 2022-12-04 DIAGNOSIS — R001 Bradycardia, unspecified: Secondary | ICD-10-CM | POA: Diagnosis not present

## 2022-12-04 DIAGNOSIS — K5900 Constipation, unspecified: Secondary | ICD-10-CM | POA: Diagnosis present

## 2022-12-04 DIAGNOSIS — R52 Pain, unspecified: Secondary | ICD-10-CM

## 2022-12-04 DIAGNOSIS — I4892 Unspecified atrial flutter: Secondary | ICD-10-CM | POA: Diagnosis present

## 2022-12-04 DIAGNOSIS — S72109A Unspecified trochanteric fracture of unspecified femur, initial encounter for closed fracture: Secondary | ICD-10-CM | POA: Diagnosis not present

## 2022-12-04 DIAGNOSIS — Z8042 Family history of malignant neoplasm of prostate: Secondary | ICD-10-CM

## 2022-12-04 DIAGNOSIS — Z87442 Personal history of urinary calculi: Secondary | ICD-10-CM

## 2022-12-04 DIAGNOSIS — R319 Hematuria, unspecified: Secondary | ICD-10-CM | POA: Diagnosis not present

## 2022-12-04 DIAGNOSIS — F4320 Adjustment disorder, unspecified: Secondary | ICD-10-CM | POA: Diagnosis not present

## 2022-12-04 DIAGNOSIS — R32 Unspecified urinary incontinence: Secondary | ICD-10-CM | POA: Diagnosis not present

## 2022-12-04 DIAGNOSIS — R310 Gross hematuria: Secondary | ICD-10-CM | POA: Diagnosis not present

## 2022-12-04 DIAGNOSIS — Z8249 Family history of ischemic heart disease and other diseases of the circulatory system: Secondary | ICD-10-CM

## 2022-12-04 LAB — GLUCOSE, CAPILLARY
Glucose-Capillary: 94 mg/dL (ref 70–99)
Glucose-Capillary: 95 mg/dL (ref 70–99)
Glucose-Capillary: 171 mg/dL — ABNORMAL HIGH (ref 70–99)
Glucose-Capillary: 105 mg/dL — ABNORMAL HIGH (ref 70–99)
Glucose-Capillary: 171 mg/dL — ABNORMAL HIGH (ref 70–99)

## 2022-12-04 LAB — CBC
HCT: 29.6 % — ABNORMAL LOW (ref 39.0–52.0)
Hemoglobin: 9.7 g/dL — ABNORMAL LOW (ref 13.0–17.0)
MCH: 30 pg (ref 26.0–34.0)
MCHC: 32.8 g/dL (ref 30.0–36.0)
MCV: 91.6 fL (ref 80.0–100.0)
Platelets: 156 10*3/uL (ref 150–400)
RBC: 3.23 MIL/uL — ABNORMAL LOW (ref 4.22–5.81)
RDW: 16.3 % — ABNORMAL HIGH (ref 11.5–15.5)
WBC: 12.4 10*3/uL — ABNORMAL HIGH (ref 4.0–10.5)
nRBC: 0 % (ref 0.0–0.2)

## 2022-12-04 LAB — BASIC METABOLIC PANEL
Anion gap: 8 (ref 5–15)
BUN: 30 mg/dL — ABNORMAL HIGH (ref 8–23)
CO2: 23 mmol/L (ref 22–32)
Calcium: 8.6 mg/dL — ABNORMAL LOW (ref 8.9–10.3)
Chloride: 104 mmol/L (ref 98–111)
Creatinine, Ser: 1.36 mg/dL — ABNORMAL HIGH (ref 0.61–1.24)
GFR, Estimated: 49 mL/min — ABNORMAL LOW (ref >60)
Glucose, Bld: 104 mg/dL — ABNORMAL HIGH (ref 70–99)
Potassium: 4.5 mmol/L (ref 3.5–5.1)
Sodium: 135 mmol/L (ref 135–145)

## 2022-12-04 LAB — URINE CULTURE: Culture: >=100000 — AB

## 2022-12-04 MED ORDER — APIXABAN 2.5 MG PO TABS
2.5000 mg | ORAL_TABLET | Freq: Two times a day (BID) | ORAL | Status: DC
  Administered 2022-12-04 – 2022-12-18 (×28): 2.5 mg via ORAL
  Filled 2022-12-04 (×29): qty 1

## 2022-12-04 MED ORDER — HYDRALAZINE HCL 50 MG PO TABS
75.0000 mg | ORAL_TABLET | Freq: Two times a day (BID) | ORAL | Status: DC
  Administered 2022-12-04 – 2022-12-18 (×28): 75 mg via ORAL
  Filled 2022-12-04 (×36): qty 1

## 2022-12-04 MED ORDER — PIPERACILLIN-TAZOBACTAM 3.375 G IVPB: 3.3750 g | Freq: Three times a day (TID) | INTRAVENOUS | Status: DC

## 2022-12-04 MED ORDER — FERROUS SULFATE 325 (65 FE) MG PO TABS
325.0000 mg | ORAL_TABLET | Freq: Every day | ORAL | Status: DC
  Administered 2022-12-05 – 2022-12-17 (×11): 325 mg via ORAL
  Filled 2022-12-04 (×13): qty 1

## 2022-12-04 MED ORDER — AMLODIPINE BESYLATE 10 MG PO TABS
10.0000 mg | ORAL_TABLET | Freq: Every day | ORAL | Status: DC
  Administered 2022-12-05 – 2022-12-18 (×14): 10 mg via ORAL
  Filled 2022-12-04 (×15): qty 1

## 2022-12-04 MED ORDER — INSULIN ASPART 100 UNIT/ML IJ SOLN
0.0000 [IU] | Freq: Three times a day (TID) | INTRAMUSCULAR | Status: DC
  Administered 2022-12-05 – 2022-12-09 (×2): 1 [IU] via SUBCUTANEOUS

## 2022-12-04 MED ORDER — ROSUVASTATIN CALCIUM 20 MG PO TABS
40.0000 mg | ORAL_TABLET | Freq: Every day | ORAL | Status: DC
  Administered 2022-12-05 – 2022-12-18 (×14): 40 mg via ORAL
  Filled 2022-12-04 (×14): qty 2

## 2022-12-04 MED ORDER — METHOCARBAMOL 500 MG PO TABS
500.0000 mg | ORAL_TABLET | Freq: Three times a day (TID) | ORAL | Status: DC | PRN
  Administered 2022-12-06 – 2022-12-14 (×5): 500 mg via ORAL
  Filled 2022-12-04 (×6): qty 1

## 2022-12-04 MED ORDER — ACETAMINOPHEN 325 MG PO TABS
650.0000 mg | ORAL_TABLET | Freq: Four times a day (QID) | ORAL | Status: DC | PRN
  Administered 2022-12-05 – 2022-12-17 (×8): 650 mg via ORAL
  Filled 2022-12-04 (×9): qty 2

## 2022-12-04 MED ORDER — ATENOLOL 25 MG PO TABS
25.0000 mg | ORAL_TABLET | Freq: Every day | ORAL | Status: DC
  Administered 2022-12-05 – 2022-12-13 (×9): 25 mg via ORAL
  Filled 2022-12-04 (×9): qty 1

## 2022-12-04 MED ORDER — VITAMIN B-12 1000 MCG PO TABS
500.0000 ug | ORAL_TABLET | Freq: Every day | ORAL | Status: DC
  Administered 2022-12-05 – 2022-12-18 (×14): 500 ug via ORAL
  Filled 2022-12-04 (×14): qty 1

## 2022-12-04 MED ORDER — PIPERACILLIN-TAZOBACTAM 3.375 G IVPB
3.3750 g | Freq: Three times a day (TID) | INTRAVENOUS | Status: AC
  Administered 2022-12-04 – 2022-12-07 (×8): 3.375 g via INTRAVENOUS
  Filled 2022-12-04 (×8): qty 50

## 2022-12-04 MED ORDER — HYDROCODONE-ACETAMINOPHEN 5-325 MG PO TABS
1.0000 | ORAL_TABLET | Freq: Four times a day (QID) | ORAL | Status: DC | PRN
  Administered 2022-12-05 – 2022-12-18 (×20): 1 via ORAL
  Filled 2022-12-04 (×24): qty 1

## 2022-12-04 MED ORDER — PENTOXIFYLLINE ER 400 MG PO TBCR
400.0000 mg | EXTENDED_RELEASE_TABLET | Freq: Two times a day (BID) | ORAL | Status: DC
  Administered 2022-12-04 – 2022-12-18 (×28): 400 mg via ORAL
  Filled 2022-12-04 (×29): qty 1

## 2022-12-04 MED ORDER — INSULIN ASPART 100 UNIT/ML IJ SOLN: 0.0000 [IU] | Freq: Every day | INTRAMUSCULAR | Status: DC

## 2022-12-04 MED ORDER — FINASTERIDE 5 MG PO TABS
5.0000 mg | ORAL_TABLET | Freq: Every day | ORAL | Status: DC
  Administered 2022-12-05 – 2022-12-18 (×14): 5 mg via ORAL
  Filled 2022-12-04 (×14): qty 1

## 2022-12-04 MED ORDER — TAMSULOSIN HCL 0.4 MG PO CAPS
0.4000 mg | ORAL_CAPSULE | Freq: Every day | ORAL | Status: DC
  Administered 2022-12-05 – 2022-12-06 (×2): 0.4 mg via ORAL
  Filled 2022-12-04 (×2): qty 1

## 2022-12-04 MED ORDER — PIPERACILLIN-TAZOBACTAM 3.375 G IVPB
3.3750 g | Freq: Three times a day (TID) | INTRAVENOUS | Status: DC
  Administered 2022-12-04: 3.375 g via INTRAVENOUS
  Filled 2022-12-04: qty 50

## 2022-12-04 NOTE — Progress Notes (Signed)
Occupational Therapy Treatment Patient Details Name: Eric [name]. MRN: [id number] DOB: 17-Sep-1927 Today's Date: 12/04/2022   History of present illness Pt is a 89 y.o. male admitted 11/30/22 after sudden onset L hip pain resulted in fall causing L greater trochanteric fx. Per ortho, plan for non-operative management WBAT with no active hip abduction. PMH includes L BKA (2019), HTN, DM2, CKD 3, CAD s/p CABG, afib on Eliquis, BPH, HLD.   OT comments  Pt agreeable to work with OT this AM. Due to high level of pain reported during LLE movement, mobility, and weightbearing, pain meds were requested and administered by nursing. Pt was able to don Left prothesis with min assist from either Son or OT. Due to high level of pain, limited weightbearing was tolerated on LLE. Geo mat was placed in recliner to help with hip pain along with ice pack. Regardless of pain, pt continues to be motivated to progress with therapy and is appropriate for  intensive inpatient follow up therapy, >3 hours/day. OT will continue to follow patient acutely.     Recommendations for follow up therapy are one component of a multi-disciplinary discharge planning process, led by the attending physician.  Recommendations may be updated based on patient status, additional functional criteria and insurance authorization.    Assistance Recommended at Discharge Frequent or constant Supervision/Assistance  Patient can return home with the following  A lot of help with walking and/or transfers;A lot of help with bathing/dressing/bathroom;Assist for transportation;Help with stairs or ramp for entrance   Equipment Recommendations  None recommended by OT    Recommendations for Other Services Rehab consult    Precautions / Restrictions Precautions Precautions: Fall;Other (comment) Precaution Comments: h/o L BKA (does have prosthetic in room) Restrictions Weight Bearing Restrictions: Yes LLE Weight Bearing: Weight bearing as  tolerated Other Position/Activity Restrictions: per secure chat with ortho PA Charma Igo) 12/02/22 8A - confirmed LLE WBAT, no active hip abduction       Mobility Bed Mobility Overal bed mobility: Needs Assistance Bed Mobility: Supine to Sit     Supine to sit: Mod assist     General bed mobility comments: Min A with bed pad used to slide hips towards EOB. Pillow remained under LLE for majority of transition due to pain. Mod assist provided to bring trunk off HOB in order to sit on edge. Patient Response: Cooperative  Transfers Overall transfer level: Needs assistance Equipment used: Rolling walker (2 wheels) Transfers: Sit to/from Stand, Bed to chair/wheelchair/BSC Sit to Stand: Min guard, From elevated surface Stand pivot transfers: Min assist, Mod assist, From elevated surface         General transfer comment: Min guard sit to stand with min-mod assist for balance. VC for of off loading left leg and weight bearing into BUEs on RW in order to pivot towards the right. Due to LLE pain, pt was unable to tolerate much weight bearing.     Balance Overall balance assessment: Needs assistance Sitting-balance support: No upper extremity supported, Feet supported Sitting balance-Leahy Scale: Fair Sitting balance - Comments: Stand by assist for balance while seated EOB to don LLE prothesis.   Standing balance support: Bilateral upper extremity supported, During functional activity, Reliant on assistive device for balance Standing balance-Leahy Scale: Zero Standing balance comment: Due to pain, pt was unable to place a lot of weight into LLE. Relied on BUEs with RW and RLE to pivot towards recliner placed to the right of the patient.  ADL either performed or assessed with clinical judgement   ADL       Lower Body Dressing: Moderate assistance;Sit to/from stand;Sitting/lateral leans Lower Body Dressing Details (indicate cue type and reason): provided assist with  donning right shoe and fastening velcro tight enough. Pt completed majority of left prosthesis donning with very minimal assist from either OT or Son. Provided assist for balance while standing.         Cognition Arousal/Alertness: Awake/alert Behavior During Therapy: WFL for tasks assessed/performed Overall Cognitive Status: Within Functional Limits for tasks assessed                      Pertinent Vitals/ Pain       Pain Assessment Pain Assessment: 0-10 Pain Score: 9  Pain Location: L hip with movement and WB Pain Descriptors / Indicators: Discomfort, Grimacing, Guarding, Sharp Pain Intervention(s): Limited activity within patient's tolerance, Monitored during session, Repositioned, Ice applied, Patient requesting pain meds-RN notified, RN gave pain meds during session         Frequency  Min 2X/week        Progress Toward Goals  OT Goals(current goals can now be found in the care plan section)  Progress towards OT goals: Progressing toward goals     Plan Discharge plan remains appropriate;Frequency remains appropriate       AM-PAC OT "6 Clicks" Daily Activity     Outcome Measure   Help from another person eating meals?: None Help from another person taking care of personal grooming?: A Little Help from another person toileting, which includes using toliet, bedpan, or urinal?: A Lot Help from another person bathing (including washing, rinsing, drying)?: A Lot Help from another person to put on and taking off regular upper body clothing?: A Little Help from another person to put on and taking off regular lower body clothing?: A Lot 6 Click Score: 16    End of Session Equipment Utilized During Treatment: Gait belt;Rolling walker (2 wheels)  OT Visit Diagnosis: Unsteadiness on feet (R26.81);Muscle weakness (generalized) (M62.81);Pain Pain - Right/Left: Left Pain - part of body: Hip   Activity Tolerance Patient limited by pain   Patient Left in chair;with  call bell/phone within reach;with chair alarm set;with family/visitor present   Nurse Communication Patient requests pain meds        Time: 1610-9604 OT Time Calculation (min): 43 min  Charges: OT General Charges $OT Visit: 1 Visit OT Treatments $Self Care/Home Management : 8-22 mins $Therapeutic Activity: 23-37 mins  [employer], OTR/L,CBIS  Supplemental OT - [facility] and [facility] Secure Chat Preferred    Nicolas Banh, Charisse March 12/04/2022, 12:08 PM

## 2022-12-04 NOTE — Discharge Summary (Signed)
Physician Discharge Summary  Eric [name]. HYQ:[id number] DOB: [birth year] DOA: 11/30/2022  PCP: [name], MD  Admit date: 11/30/2022 Discharge date: 12/04/2022 Recommendations for Outpatient Follow-up:  Follow up with PCP in 1 weeks-call for appointment Please obtain BMP/CBC in one week  Discharge Dispo: CIR Discharge Condition: Stable Code Status:   Code Status: Full Code Diet recommendation:  Diet Order             Diet heart healthy/carb modified Room service appropriate? Yes; Fluid consistency: Thin  Diet effective now                    Brief/Interim Summary: 89 y.o. male with medical history significant of diet-controlled diabetes, coronary artery disease, essential hypertension, obstructive sleep apnea, peripheral vascular disease status post BKA on the left, essential hypertension, atrial fibrillation presented with mechanical fall and left hip pain.  He was found to have nondisplaced fracture of the left greater trochanter.  Orthopedics was consulted  and admitted. Seen by Dr. Victorino Dike had MRI of the left hip showing acute fracture of the greater trochanter with superior displacement up to 2 cm, mild bilateral hip osteoarthritis no other fractures.  Discussed Dr. Victorino Dike 4/25- plan for non operative treatment, resumed Eliquis and cont weightbearing as tolerated.  PT OT has evaluated and recommending inpatient rehab. Awaiting CIR    Discharge Diagnoses:  Principal Problem:   Closed left femoral fracture (HCC) Active Problems:   Essential hypertension   Obstructive sleep apnea   Chronic coronary artery disease   Type 2 diabetes mellitus (HCC)   PVD (peripheral vascular disease) (HCC)   Chronic anticoagulation   Atrial flutter (HCC)   Hx of BKA, left (HCC)   Acute kidney injury superimposed on CKD (HCC)  Acute left greater trochanter fracture displaced, secondary to mechanical fall w/ lt BKA: Seen by Dr. Victorino Dike got MRI of the left hip- plan for non  operative treatment, eliquis resumed. Cont ptot CIR pending. Cont pain control  IV/PO opiates and nonopiates, bowel regimen.   Chronic atrial fibrillation--rate controlled. Cont atenolol and AC w/ home Eliquis   Hypertension Hyperlipidemia CAD with history of CABG: BP stable.No chest pain.  Continue amlodipine, atenolol, hydralazine as BP tolerates. Continue statin. Lasix on hold will resume on discharge   AKI on chronic kidney disease stage IIIb:Creatinine at 1.8>1.6>>1.3 stable- monitor Recent Labs (within last 365 days)              Recent Labs    09/28/22 1024 10/05/22 1412 10/19/22 1422 11/16/22 1113 11/23/22 1003 11/30/22 2050 12/01/22 0645 12/02/22 0245 12/03/22 0306 12/04/22 0431  BUN 28 24 31* 19 29* 33* 30* 30* 27* 30*  CREATININE 2.50* 1.21 1.71* 1.35* 1.70* 1.87* 1.60* 1.43* 1.31* 1.36*      BPH--continue tamsulosin   T2DM:well controlled on SSI Last Labs         Recent Labs  Lab 12/03/22 1119 12/03/22 1622 12/03/22 2145 12/04/22 0757 12/04/22 1118  GLUCAP 160* 101* 148* 94 95      Anemia chronic disease:From chronic illnesses.  Hemoglobin stable in 9s trend   Mild thrombocytopenia in the setting of acute illness-resolved   Peripheral arterial disease status post left BKA: cont trental and crestor   UTI- has Pyuria/bacteriuria: WBC>50 moderate leukocytes nitrate negative-100,000 Pseudomonas  and 10,000 E. Coli, labs has leucocytosis. When asked today he says he had  difficulty initialing urine, some dysuria. We will give zosyn iv x 3 days at least then po  cipro x 3 more days. Son agreeable  Consults: ORTHOPEDICS Subjective: AAOX3  Discharge Exam: Vitals:   12/04/22 0435 12/04/22 0800  BP: (!) 141/62 (!) 124/55  Pulse: 60 82  Resp: 16 18  Temp: 98.7 F (37.1 C) 98.6 F (37 C)  SpO2: 95% 92%   General: Pt is alert, awake, not in acute distress Cardiovascular: RRR, S1/S2 +, no rubs, no gallops Respiratory: CTA bilaterally, no wheezing,  no rhonchi Abdominal: Soft, NT, ND, bowel sounds + Extremities: no edema, no cyanosis  Discharge Instructions  Discharge Instructions     Discharge instructions   Complete by: As directed    Inpatient rehab   Increase activity slowly   Complete by: As directed    Weight bearing as tolerated   Complete by: As directed    In BK prosthesis.   Laterality: left   Extremity: Lower      Allergies as of 12/04/2022       Reactions   Isosorbide Mononitrate Other (See Comments)   Unknown, doesn't remember    Ramipril Cough, Other (See Comments)   Quinidine Gluconate Rash   Quinidine Gluconate Other (See Comments), Rash        Medication List     TAKE these medications    acetaminophen 325 MG tablet Commonly known as: TYLENOL Take 2 tablets (650 mg total) by mouth every 6 (six) hours as needed for mild pain (or temp >/= 101 F).   amLODipine 10 MG tablet Commonly known as: NORVASC Take 1 tablet (10 mg total) by mouth daily.   apixaban 2.5 MG Tabs tablet Commonly known as: Eliquis Take 1 tablet (2.5 mg total) by mouth 2 (two) times daily. OFFICE VISIT NEEDED FOR ADDITIONAL REFILLS   atenolol 25 MG tablet Commonly known as: TENORMIN Take 1 tablet by mouth once daily   Cinnamon 500 MG Tabs Take 500 mg by mouth daily.   Cranberry 500 MG Caps Take 500 mg by mouth daily.   cyanocobalamin 500 MCG tablet Commonly known as: VITAMIN B12 Take 500 mcg by mouth daily.   DAILY MULTIVITAMIN PO Take 1 tablet by mouth daily.   ferrous sulfate 325 (65 FE) MG EC tablet Take 325 mg by mouth at bedtime.   finasteride 5 MG tablet Commonly known as: PROSCAR TAKE 1 TABLET BY MOUTH ONCE DAILY   furosemide 40 MG tablet Commonly known as: LASIX Take 1 tablet (40 mg total) by mouth daily.   hydrALAZINE 25 MG tablet Commonly known as: APRESOLINE Take 3 tablets (75 mg total) by mouth in the morning and at bedtime.   HYDROcodone-acetaminophen 5-325 MG tablet Commonly known as:  Norco Take 1 tablet by mouth every 6 (six) hours as needed for up to 5 days for severe pain.   OneTouch Ultra test strip Generic drug: glucose blood USE 1 STRIP TO CHECK GLUCOSE ONCE DAILY   pentoxifylline 400 MG CR tablet Commonly known as: TRENTAL TAKE 1 TABLET BY MOUTH EVERY 12 HOURS . What changed:  how much to take how to take this when to take this   piperacillin-tazobactam 3.375 GM/50ML IVPB Commonly known as: ZOSYN Inject 50 mLs (3.375 g total) into the vein every 8 (eight) hours for 3 days.   rosuvastatin 40 MG tablet Commonly known as: CRESTOR Take 1 tablet (40 mg total) by mouth daily.   tamsulosin 0.4 MG Caps capsule Commonly known as: FLOMAX Take 1 capsule (0.4 mg total) by mouth daily.  Discharge Care Instructions  (From admission, onward)           Start     Ordered   12/03/22 0000  Weight bearing as tolerated       Comments: In BK prosthesis.  Question Answer Comment  Laterality left   Extremity Lower      12/03/22 1346            Follow-up Information     Toni Arthurs, MD. Schedule an appointment as soon as possible for a visit in 2 week(s).   Specialty: Orthopedic Surgery Contact information: [address] [place] [address] [phone]                Allergies  Allergen Reactions   Isosorbide Mononitrate Other (See Comments)    Unknown, doesn't remember    Ramipril Cough and Other (See Comments)   Quinidine Gluconate Rash   Quinidine Gluconate Other (See Comments) and Rash    The results of significant diagnostics from this hospitalization (including imaging, microbiology, ancillary and laboratory) are listed below for reference.    Microbiology: Recent Results (from the past 240 hour(s))  Urine Culture     Status: Abnormal   Collection Time: 12/01/22  6:57 PM   Specimen: Urine, Catheterized  Result Value Ref Range Status   Specimen Description URINE, CATHETERIZED  Final    Special Requests   Final    NONE Performed at [facility], [address], [place], [place] [address]    Culture (A)  Final    >=100,000 COLONIES/mL PSEUDOMONAS AERUGINOSA 10,000 COLONIES/mL ESCHERICHIA COLI Confirmed Extended Spectrum Beta-Lactamase Producer (ESBL).  In bloodstream infections from ESBL organisms, carbapenems are preferred over piperacillin/tazobactam. They are shown to have a lower risk of mortality.    Report Status 12/04/2022 FINAL  Final   Organism ID, Bacteria PSEUDOMONAS AERUGINOSA (A)  Final   Organism ID, Bacteria ESCHERICHIA COLI (A)  Final      Susceptibility   Escherichia coli - MIC*    AMPICILLIN >=32 RESISTANT Resistant     CEFAZOLIN >=64 RESISTANT Resistant     CEFEPIME >=32 RESISTANT Resistant     CEFTRIAXONE >=64 RESISTANT Resistant     CIPROFLOXACIN >=4 RESISTANT Resistant     GENTAMICIN >=16 RESISTANT Resistant     IMIPENEM <=0.25 SENSITIVE Sensitive     NITROFURANTOIN 128 RESISTANT Resistant     TRIMETH/SULFA >=320 RESISTANT Resistant     AMPICILLIN/SULBACTAM >=32 RESISTANT Resistant     PIP/TAZO 16 SENSITIVE Sensitive     * 10,000 COLONIES/mL ESCHERICHIA COLI   Pseudomonas aeruginosa - MIC*    CEFTAZIDIME <=1 SENSITIVE Sensitive     CIPROFLOXACIN <=0.25 SENSITIVE Sensitive     GENTAMICIN <=1 SENSITIVE Sensitive     IMIPENEM 1 SENSITIVE Sensitive     PIP/TAZO <=4 SENSITIVE Sensitive     CEFEPIME 0.5 SENSITIVE Sensitive     * >=100,000 COLONIES/mL PSEUDOMONAS AERUGINOSA    Procedures/Studies: MR HIP LEFT WO CONTRAST  Result Date: 12/02/2022 CLINICAL DATA:  The patient suffered a fall with onset of left hip pain he was getting up a chair. EXAM: MR OF THE LEFT HIP WITHOUT CONTRAST TECHNIQUE: Multiplanar, multisequence MR imaging was performed. No intravenous contrast was administered. COMPARISON:  Plain films and CT left hip 11/30/2022. FINDINGS: Bones: As on the prior exams, the patient has an acute fracture of the greater trochanter with  associated hematoma formation. The fracture does not extend into the femoral neck or intertrochanteric femur. Fracture  fragments are superiorly displaced up to approximately 2 cm. No other fracture is identified. There is mild degenerative change about the hips. Articular cartilage and labrum Articular cartilage:  Thinned without focal defect. Labrum:  Intact. Joint or bursal effusion Joint effusion:  None. Bursae: Negative. Muscles and tendons Muscles and tendons: Intact. The gluteus medius tendon and attaches to a greater trochanter fracture fragment. Other findings Miscellaneous:   None. IMPRESSION: 1. Acute fracture of the greater trochanter with superior displacement of fracture fragments up to 2 cm and surrounding hematoma. Negative for extension into the femoral neck or intertrochanteric femur. 2. Mild bilateral hip osteoarthritis. Electronically Signed   By: Drusilla Kanner M.D.   On: 12/02/2022 11:54   CT HIP LEFT WO CONTRAST  Result Date: 12/01/2022 CLINICAL DATA:  Left hip fracture EXAM: CT OF THE LEFT HIP WITHOUT CONTRAST TECHNIQUE: Multidetector CT imaging of the left hip was performed according to the standard protocol. Multiplanar CT image reconstructions were also generated. RADIATION DOSE REDUCTION: This exam was performed according to the departmental dose-optimization program which includes automated exposure control, adjustment of the mA and/or kV according to patient size and/or use of iterative reconstruction technique. COMPARISON:  Radiographs 11/30/2022 and CT pelvis from 06/15/2019 FINDINGS: Bones/Joint/Cartilage Comminuted fracture the greater trochanter with up to 0.8 cm proximal displacement components of the greater trochanter. This type of fracture is somewhat unusual and typically associated with an intertrochanteric fracture; however in this case, CT does not conclusively reveal a full intertrochanteric component of the fracture. No other regional fracture of the left hemipelvis.  8 mm anterolisthesis of L5 on S1 associated with known bilateral pars defects. Ligaments Suboptimally assessed by CT. Muscles and Tendons Indistinctness of tissue planes and hazy density along the gluteal musculature and quadratus obturator externus muscles. Abnormal expansion and density of the left hamstring tendon proximally suggesting hamstring tendon tear or injury. Soft tissues Foley catheter in the somewhat thick-walled but decompressed urinary bladder. Bladder diverticula are noted and demonstrate wall thickening and marginal stranding questionable prominent lymph nodes versus additional new diverticula of the urinary bladder on both sides, for example on image 43 of series 5. Descending and sigmoid colon diverticulosis. IMPRESSION: 1. Comminuted fracture of the greater trochanter left femur with up to 0.8 cm proximal displacement components of the greater trochanter. This type of fracture is somewhat unusual and typically associated with an intertrochanteric fracture; however in this case, CT does not conclusively reveal a full intertrochanteric component of the fracture. The possibility of a nondisplaced occult full intertrochanteric fracture cannot be totally dismissed, and MRI left hip without contrast follow up to ensure the lack of such an occult fracture would be reasonable in some clinical circumstances. 2. Abnormal expansion and density of the left hamstring tendon proximally suggesting hamstring tendon tear or injury. 3. Indistinctness of tissue planes and hazy density along the gluteal musculature and quadratus obturator externus muscles, compatible with local hematoma 4. Foley catheter in the urinary bladder. Bladder diverticula are noted and demonstrate wall thickening and marginal stranding. There are questionable prominent lymph nodes versus additional new diverticula of the urinary bladder on both sides. Overall the appearance favors cystitis with reactive lymph nodes and inflamed associated  bladder diverticula. 5. Descending and sigmoid colon diverticulosis. 6. 8 mm anterolisthesis of L5 on S1 associated with known bilateral pars defects. Electronically Signed   By: Gaylyn Rong M.D.   On: 12/01/2022 10:35   DG Pelvis 1-2 Views  Result Date: 11/30/2022 CLINICAL DATA:  Fall, left hip  pain EXAM: PELVIS - 1-2 VIEW COMPARISON:  Left femur series today FINDINGS: There is a minimally displaced fracture through the left greater trochanter. No visible involvement of the femoral neck. No subluxation or dislocation. Mild symmetric degenerative changes in the hips. IMPRESSION: Left greater trochanter fracture. No visible femoral neck involvement. Electronically Signed   By: Charlett Nose M.D.   On: 11/30/2022 20:37   DG Femur Min 2 Views Left  Result Date: 11/30/2022 CLINICAL DATA:  Fall, left hip pain EXAM: LEFT FEMUR 2 VIEWS COMPARISON:  CT abdomen and pelvis 06/15/2019 FINDINGS: Irregularity in the greater trochanter compatible with fracture. No visible fracture crossing the femoral neck. No subluxation or dislocation. Mild degenerative changes in the left hip and left knee. No joint effusion within the left knee. IMPRESSION: Minimally displaced fracture through the left femoral greater trochanter. No visible femoral neck involvement. Electronically Signed   By: Charlett Nose M.D.   On: 11/30/2022 20:36    Labs: BNP (last 3 results) Recent Labs    08/06/22 1045 10/19/22 1447 11/16/22 1113  BNP 319.5* 147* 140*   Basic Metabolic Panel: Recent Labs  Lab 11/30/22 2050 12/01/22 0645 12/02/22 0245 12/03/22 0306 12/04/22 0431  NA 138 139 133* 134* 135  K 3.7 3.7 4.6 4.4 4.5  CL 102 107 101 101 104  CO2 27 25 24 25 23   GLUCOSE 124* 80 92 93 104*  BUN 33* 30* 30* 27* 30*  CREATININE 1.87* 1.60* 1.43* 1.31* 1.36*  CALCIUM 8.9 8.6* 8.6* 8.6* 8.6*  MG  --   --  1.7  --   --    Liver Function Tests: Recent Labs  Lab 12/01/22 0645  AST 24  ALT 20  ALKPHOS 44  BILITOT 0.9   PROT 5.8*  ALBUMIN 2.7*   No results for input(s): "LIPASE", "AMYLASE" in the last 168 hours. No results for input(s): "AMMONIA" in the last 168 hours. CBC: Recent Labs  Lab 11/30/22 2050 12/01/22 0645 12/02/22 0245 12/03/22 0306 12/04/22 0431  WBC 9.4 11.1* 12.3* 11.5* 12.4*  NEUTROABS 5.5  --  7.2  --   --   HGB 10.9* 10.2* 9.8* 9.4* 9.7*  HCT 34.0* 32.7* 29.4* 29.9* 29.6*  MCV 92.9 93.2 91.9 92.9 91.6  PLT 178 158 136* 139* 156   Cardiac Enzymes: No results for input(s): "CKTOTAL", "CKMB", "CKMBINDEX", "TROPONINI" in the last 168 hours. BNP: Invalid input(s): "POCBNP" CBG: Recent Labs  Lab 12/03/22 1119 12/03/22 1622 12/03/22 2145 12/04/22 0757 12/04/22 1118  GLUCAP 160* 101* 148* 94 95   D-Dimer No results for input(s): "DDIMER" in the last 72 hours. Hgb A1c No results for input(s): "HGBA1C" in the last 72 hours. Lipid Profile No results for input(s): "CHOL", "HDL", "LDLCALC", "TRIG", "CHOLHDL", "LDLDIRECT" in the last 72 hours. Thyroid function studies No results for input(s): "TSH", "T4TOTAL", "T3FREE", "THYROIDAB" in the last 72 hours.  Invalid input(s): "FREET3" Anemia work up No results for input(s): "VITAMINB12", "FOLATE", "FERRITIN", "TIBC", "IRON", "RETICCTPCT" in the last 72 hours. Urinalysis    Component Value Date/Time   COLORURINE YELLOW 12/01/2022 1856   APPEARANCEUR TURBID (A) 12/01/2022 1856   APPEARANCEUR Cloudy (A) 03/12/2021 1147   LABSPEC 1.014 12/01/2022 1856   PHURINE 5.0 12/01/2022 1856   GLUCOSEU NEGATIVE 12/01/2022 1856   HGBUR SMALL (A) 12/01/2022 1856   HGBUR trace-lysed 02/07/2008 1101   BILIRUBINUR NEGATIVE 12/01/2022 1856   BILIRUBINUR Negative 03/12/2021 1147   KETONESUR NEGATIVE 12/01/2022 1856   PROTEINUR 100 (A) 12/01/2022 1856  UROBILINOGEN 0.2 02/07/2008 1101   NITRITE NEGATIVE 12/01/2022 1856   LEUKOCYTESUR MODERATE (A) 12/01/2022 1856   Sepsis Labs Recent Labs  Lab 12/01/22 0645 12/02/22 0245  12/03/22 0306 12/04/22 0431  WBC 11.1* 12.3* 11.5* 12.4*   Microbiology Recent Results (from the past 240 hour(s))  Urine Culture     Status: Abnormal   Collection Time: 12/01/22  6:57 PM   Specimen: Urine, Catheterized  Result Value Ref Range Status   Specimen Description URINE, CATHETERIZED  Final   Special Requests   Final    NONE Performed at [facility], [address], [place], [place] [address]    Culture (A)  Final    >=100,000 COLONIES/mL PSEUDOMONAS AERUGINOSA 10,000 COLONIES/mL ESCHERICHIA COLI Confirmed Extended Spectrum Beta-Lactamase Producer (ESBL).  In bloodstream infections from ESBL organisms, carbapenems are preferred over piperacillin/tazobactam. They are shown to have a lower risk of mortality.    Report Status 12/04/2022 FINAL  Final   Organism ID, Bacteria PSEUDOMONAS AERUGINOSA (A)  Final   Organism ID, Bacteria ESCHERICHIA COLI (A)  Final      Susceptibility   Escherichia coli - MIC*    AMPICILLIN >=32 RESISTANT Resistant     CEFAZOLIN >=64 RESISTANT Resistant     CEFEPIME >=32 RESISTANT Resistant     CEFTRIAXONE >=64 RESISTANT Resistant     CIPROFLOXACIN >=4 RESISTANT Resistant     GENTAMICIN >=16 RESISTANT Resistant     IMIPENEM <=0.25 SENSITIVE Sensitive     NITROFURANTOIN 128 RESISTANT Resistant     TRIMETH/SULFA >=320 RESISTANT Resistant     AMPICILLIN/SULBACTAM >=32 RESISTANT Resistant     PIP/TAZO 16 SENSITIVE Sensitive     * 10,000 COLONIES/mL ESCHERICHIA COLI   Pseudomonas aeruginosa - MIC*    CEFTAZIDIME <=1 SENSITIVE Sensitive     CIPROFLOXACIN <=0.25 SENSITIVE Sensitive     GENTAMICIN <=1 SENSITIVE Sensitive     IMIPENEM 1 SENSITIVE Sensitive     PIP/TAZO <=4 SENSITIVE Sensitive     CEFEPIME 0.5 SENSITIVE Sensitive     * >=100,000 COLONIES/mL PSEUDOMONAS AERUGINOSA     Time coordinating discharge: 25 minutes  SIGNED: Lanae Boast, MD  [facility] 12/04/2022, 3:03 PM  If 7PM-7AM, please contact  night-coverage [email or web address]

## 2022-12-04 NOTE — H&P (Signed)
Physical Medicine and Rehabilitation Admission H&P   CC: Functional deficits secondary to left greater trochanter fracture; prior left BKA  HPI: Eric [name], Eric [name] is a 89 year old male presented to the ED on 11/30/2022 complaining of left hip pain. He evidently got up from a chair, felt left hip pain and lowered himself to the floor. No other injury or LOC. He is s/p left BKA in 2019 and does use a prosthesis.  X-ray of patient's left hip shows positive fracture at the left greater trochanter.  Discussed with Dr. Victorino Dike from orthopedics and he saw the patient consultation.  Admitted to the medicine service. He is on Eliquis for atrial fibrillation. This was initially held for possible surgical intervention. Oral anti-hypotensives continued. Found to have AKI atop CKD stage III. Improved with hydration. PMH significant for chronic anemia, CAD, OSA, PVD, hyperlipidemia. DM-2 diet controlled.  UA positive for GNR and started on Zosyn today. Plan is to convert to Cipro. Follow-up culture results.  Able to stand from recliner with min assist, very little WB through LLE while wearing prosthesis. Min assist with small pivotal steps to bed, requires extra time but gradually putting more pressure through LLE. Good control with descent. The patient requires inpatient medicine and rehabilitation evaluations and services for ongoing dysfunction secondary to left greater trochanter fracture; prior left BKA.    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 Married x 73 years      5 kids    11 grandchildren.    20 great grandchilren.    Review of Systems  Constitutional:  Positive for malaise/fatigue.  HENT:  Negative for hearing loss.   Eyes:  Negative for blurred vision.  Respiratory:  Negative for cough.   Cardiovascular:  Negative for chest pain.  Gastrointestinal:  Negative for heartburn.  Genitourinary:  Positive for urgency.  Musculoskeletal:  Positive for joint pain and myalgias.   Neurological:  Positive for weakness. Negative for dizziness.  Psychiatric/Behavioral:  Negative for depression.    Past Medical History:  Diagnosis Date   Atrial flutter (HCC)    s/p RFCA   CAD (coronary artery disease)    cath 2003, occluded S-RCA, occluded S-Dx, L-LAD ok, s/p PTCA to LAD   Cataract    CKD (chronic kidney disease) stage 3, GFR 30-59 ml/min (HCC)    Diabetes mellitus    diet controlled   Hearing aid worn    bilateral   History of kidney stones    Long term (current) use of anticoagulants    Neuromuscular disorder (HCC)    OSA (obstructive sleep apnea) 12/11   very mild, AHI 7/hr   Persistent atrial fibrillation (HCC) 09/26/2015   Pure hyperglyceridemia    PVD (peripheral vascular disease) (HCC)    angioplasty of his right lower extremity in [place] by Dr.[name] 2013   Unspecified essential hypertension    Wears dentures    full upper and lower   Past Surgical History:  Procedure Laterality Date   ABDOMINAL AORTOGRAM W/LOWER EXTREMITY N/A 11/15/2017   Procedure: ABDOMINAL AORTOGRAM W/LOWER EXTREMITY;  Surgeon: Sherren Kerns, MD;  Location: [facility];  Service: Cardiovascular;  Laterality: N/A;   Adenosine Myoview  3/06   EF 56%, neg. Ischemia   Adenosine Myoview  02/18/07   nml   AMPUTATION Left 12/15/2017   Procedure: AMPUTATION BELOW KNEE;  Surgeon: Annice Needy, MD;  Location: [facility];  Service: Vascular;  Laterality: Left;   ANGIOPLASTY  1/99  CAD- diogonal with rotational artherectomy   Arthrectomy     of LAD & PTCA   BLEPHAROPLASTY Bilateral    CARDIAC CATHETERIZATION  1/00   CARDIOVERSION  1/04   CARDIOVERSION  5/07   hospital- a flutter   CARPAL TUNNEL RELEASE     ? bilateral   CATARACT EXTRACTION W/PHACO Right 07/28/2017   Procedure: CATARACT EXTRACTION PHACO AND INTRAOCULAR LENS PLACEMENT (IOC) RIGHT DIABETIC;  Surgeon: Lockie Mola, MD;  Location: [facility];  Service: Ophthalmology;  Laterality: Right;   Diabetic - diet controlled   CATARACT EXTRACTION W/PHACO Left 08/18/2017   Procedure: CATARACT EXTRACTION PHACO AND INTRAOCULAR LENS PLACEMENT (IOC);  Surgeon: Lockie Mola, MD;  Location: [facility];  Service: Ophthalmology;  Laterality: Left;  DIABETES - oral meds   COLONOSCOPY W/ BIOPSIES  10/01/06   sigmoid polyp bx neg, 3 years   COLONOSCOPY WITH PROPOFOL N/A 04/28/2021   Procedure: COLONOSCOPY WITH PROPOFOL;  Surgeon: Iva Boop, MD;  Location: [facility];  Service: Endoscopy;  Laterality: N/A;   CORONARY ANGIOPLASTY  4/03   cutting balloon PTCA pLAD into Diag   CORONARY ARTERY BYPASS GRAFT  2000   LIMA-LAD, SVG-RCA, SVG-Diag; SVG-Diag & SVG-RCA occluded 2003   FRACTURE SURGERY     HAND SURGERY  08/27/09   R thumb procedure wit Scaphoid Gragt and screws, Dr Merlyn Lot   LOWER EXTREMITY ANGIOGRAPHY Right 01/25/2019   Procedure: LOWER EXTREMITY ANGIOGRAPHY;  Surgeon: Renford Dills, MD;  Location: [facility];  Service: Cardiovascular;  Laterality: Right;   NM MYOVIEW LTD  4/11   normal   PERIPHERAL VASCULAR CATHETERIZATION Right 06/27/2015   Procedure: Lower Extremity Angiography;  Surgeon: Annice Needy, MD;  Location: [facility];  Service: Cardiovascular;  Laterality: Right;   PERIPHERAL VASCULAR CATHETERIZATION  06/27/2015   Procedure: Lower Extremity Intervention;  Surgeon: Annice Needy, MD;  Location: [facility];  Service: Cardiovascular;;   POLYPECTOMY  04/28/2021   Procedure: POLYPECTOMY;  Surgeon: Iva Boop, MD;  Location: [facility];  Service: Endoscopy;;   Family History  Problem Relation Age of Onset   Stroke Father    Aneurysm Father    Hypertension Father    Prostate cancer Brother    Hypertension Mother    Lung cancer Brother        smoker   Thrombosis Brother    Other Brother        RF valve disorder (smoker)   Lung cancer Brother        smoker   Breast cancer Sister    Liver cancer Brother    Other Sister         cerebral hemorrhage   Social History:  reports that he has never smoked. He quit smokeless tobacco use about 30 years ago. He reports that he does not drink alcohol and does not use drugs. Allergies:  Allergies  Allergen Reactions   Isosorbide Mononitrate Other (See Comments)    Unknown, doesn't remember    Ramipril Cough and Other (See Comments)   Quinidine Gluconate Rash   Quinidine Gluconate Other (See Comments) and Rash   Medications Prior to Admission  Medication Sig Dispense Refill   acetaminophen (TYLENOL) 325 MG tablet Take 2 tablets (650 mg total) by mouth every 6 (six) hours as needed for mild pain (or temp >/= 101 F).     amLODipine (NORVASC) 10 MG tablet Take 1 tablet (10 mg total) by mouth daily. 90 tablet 1  apixaban (ELIQUIS) 2.5 MG TABS tablet Take 1 tablet (2.5 mg total) by mouth 2 (two) times daily. OFFICE VISIT NEEDED FOR ADDITIONAL REFILLS 180 tablet 1   atenolol (TENORMIN) 25 MG tablet Take 1 tablet by mouth once daily 90 tablet 0   Cinnamon 500 MG TABS Take 500 mg by mouth daily.     Cranberry 500 MG CAPS Take 500 mg by mouth daily.     cyanocobalamin (VITAMIN B12) 500 MCG tablet Take 500 mcg by mouth daily.     ferrous sulfate 325 (65 FE) MG EC tablet Take 325 mg by mouth at bedtime.     finasteride (PROSCAR) 5 MG tablet TAKE 1 TABLET BY MOUTH ONCE DAILY (Patient taking differently: Take 5 mg by mouth daily.) 90 tablet 3   furosemide (LASIX) 40 MG tablet Take 1 tablet (40 mg total) by mouth daily. 90 tablet 0   hydrALAZINE (APRESOLINE) 25 MG tablet Take 3 tablets (75 mg total) by mouth in the morning and at bedtime. 180 tablet 3   Multiple Vitamin (DAILY MULTIVITAMIN PO) Take 1 tablet by mouth daily.     pentoxifylline (TRENTAL) 400 MG CR tablet TAKE 1 TABLET BY MOUTH EVERY 12 HOURS . (Patient taking differently: Take 400 mg by mouth every 12 (twelve) hours. TAKE 1 TABLET BY MOUTH EVERY 12 HOURS .) 180 tablet 1   rosuvastatin (CRESTOR) 40 MG tablet Take 1  tablet (40 mg total) by mouth daily. 90 tablet 1   tamsulosin (FLOMAX) 0.4 MG CAPS capsule Take 1 capsule (0.4 mg total) by mouth daily. 90 capsule 1   ONETOUCH ULTRA test strip USE 1 STRIP TO CHECK GLUCOSE ONCE DAILY 50 each 0      Home: Home Living Family/patient expects to be discharged to:: Private residence Living Arrangements: Spouse/significant other Available Help at Discharge: Family, Available PRN/intermittently (son that checks in regularly) Type of Home: House Home Access: Level entry Home Layout: Two level Alternate Level Stairs-Number of Steps: stair lift from basement entrance (level entry) to main level Bathroom Shower/Tub: Health visitor: Handicapped height Home Equipment: Agricultural consultant (2 wheels), Rollator (4 wheels), Wheelchair - manual, Wheelchair - power, Information systems manager, Hand held shower head, Grab bars - tub/shower Additional Comments: wife mod indep with SPC; daughter and son live nearby, check on pt near daily   Functional History: Prior Function Prior Level of Function : Independent/Modified Independent Mobility Comments: typically independent ambulating with LLE prosthetic donned, no DME. reports using w/c first thing in the morning ADLs Comments: independent and manages own meds, participates in IADL  Functional Status:  Mobility: Bed Mobility Overal bed mobility: Needs Assistance Bed Mobility: Supine to Sit Supine to sit: Mod assist Sit to supine: Mod assist General bed mobility comments: Min A with bed pad used to slide hips towards EOB. Pillow remained under LLE for majority of transition due to pain. Mod assist provided to bring trunk off HOB in order to sit on edge. Transfers Overall transfer level: Needs assistance Equipment used: Rolling walker (2 wheels) Transfers: Sit to/from Stand, Bed to chair/wheelchair/BSC Sit to Stand: Min guard, From elevated surface Bed to/from chair/wheelchair/BSC transfer type:: Stand pivot Stand  pivot transfers: Min assist, Mod assist, From elevated surface Step pivot transfers: Mod assist, +2 physical assistance General transfer comment: Min guard sit to stand with min-mod assist for balance. VC for of off loading left leg and weight bearing into BUEs on RW in order to pivot towards the right. Due to LLE pain, pt was  unable to tolerate much weight bearing.      ADL: ADL Overall ADL's : Needs assistance/impaired Eating/Feeding: Set up, Sitting Grooming: Wash/dry hands, Wash/dry face, Set up, Sitting Grooming Details (indicate cue type and reason): EOB Upper Body Bathing: Moderate assistance Upper Body Bathing Details (indicate cue type and reason): for back Lower Body Bathing: Moderate assistance Lower Body Bathing Details (indicate cue type and reason): assist for knees down Upper Body Dressing : Moderate assistance, Sitting Upper Body Dressing Details (indicate cue type and reason): UE needed to support balance at this time Lower Body Dressing: Moderate assistance, Sit to/from stand, Sitting/lateral leans Lower Body Dressing Details (indicate cue type and reason): provided assist with donning right shoe and fastening velcro tight enough. Pt completed majority of left prosthesis donning with very minimal assist from either OT or Son. Provided assist for balance while standing. Toilet Transfer: Moderate assistance, +2 for physical assistance, +2 for safety/equipment, Stand-pivot, BSC/3in1, Rolling walker (2 wheels) Toilet Transfer Details (indicate cue type and reason): assist for boost Toileting- Clothing Manipulation and Hygiene: Maximal assistance Functional mobility during ADLs: Moderate assistance, +2 for physical assistance, +2 for safety/equipment, Cueing for safety, Cueing for sequencing, Rolling walker (2 wheels) General ADL Comments: decreased access to LB for ADL, pain, decreased knowledge of AE, balance  Cognition: Cognition Overall Cognitive Status: Within Functional  Limits for tasks assessed Orientation Level: Oriented X4 Cognition Arousal/Alertness: Awake/alert Behavior During Therapy: WFL for tasks assessed/performed Overall Cognitive Status: Within Functional Limits for tasks assessed General Comments: WFL for simple tasks, not formally assesesd. good sense of humor; sequencing mobility very well with minimal cuing  Physical Exam: Blood pressure (!) 124/55, pulse 82, temperature 98.6 F (37 C), temperature source Oral, resp. rate 18, height 5\' 5"  (1.651 m), weight 77.2 kg, SpO2 92 %. Physical Exam Constitutional:      General: He is not in acute distress.    Comments: Uncomfortable d/t pain  HENT:     Head: Normocephalic.     Right Ear: External ear normal.     Left Ear: External ear normal.     Mouth/Throat:     Mouth: Mucous membranes are moist.  Eyes:     Extraocular Movements: Extraocular movements intact.     Pupils: Pupils are equal, round, and reactive to light.  Cardiovascular:     Rate and Rhythm: Normal rate and regular rhythm.     Heart sounds: No murmur heard.    No gallop.  Pulmonary:     Effort: Pulmonary effort is normal. No respiratory distress.  Abdominal:     General: Bowel sounds are normal. There is no distension.     Palpations: Abdomen is soft. There is no mass.  Musculoskeletal:     Cervical back: Normal range of motion.     Comments: Mild swelling LLE. Left hip with significant tenderness with movement and palpation.   Skin:    General: Skin is warm and dry.  Neurological:     Mental Status: He is alert.     Comments: Alert and oriented x 3. Normal insight and awareness. Intact Memory. Normal language and speech. Cranial nerve exam unremarkable. MMT: UE 5/5. RLE 4/5. LLE 3/5 HF, KE d/t pain. Sensory exam normal for light touch and pain in all 4 limbs. No limb ataxia or cerebellar signs. No abnormal tone appreciated.  [name]    Psychiatric:        Mood and Affect: Mood normal.        Behavior: Behavior normal.  Results for orders placed or performed during the hospital encounter of 11/30/22 (from the past 48 hour(s))  Glucose, capillary     Status: Abnormal   Collection Time: 12/02/22  4:21 PM  Result Value Ref Range   Glucose-Capillary 114 (H) 70 - 99 mg/dL    Comment: Glucose reference range applies only to samples taken after fasting for at least 8 hours.  Glucose, capillary     Status: Abnormal   Collection Time: 12/02/22  8:51 PM  Result Value Ref Range   Glucose-Capillary 180 (H) 70 - 99 mg/dL    Comment: Glucose reference range applies only to samples taken after fasting for at least 8 hours.  Basic metabolic panel     Status: Abnormal   Collection Time: 12/03/22  3:06 AM  Result Value Ref Range   Sodium 134 (L) 135 - 145 mmol/L   Potassium 4.4 3.5 - 5.1 mmol/L   Chloride 101 98 - 111 mmol/L   CO2 25 22 - 32 mmol/L   Glucose, Bld 93 70 - 99 mg/dL    Comment: Glucose reference range applies only to samples taken after fasting for at least 8 hours.   BUN 27 (H) 8 - 23 mg/dL   Creatinine, Ser 1.61 (H) 0.61 - 1.24 mg/dL   Calcium 8.6 (L) 8.9 - 10.3 mg/dL   GFR, Estimated 51 (L) >60 mL/min    Comment: (NOTE) Calculated using the CKD-EPI Creatinine Equation (2021)    Anion gap 8 5 - 15    Comment: Performed at [facility], [address], [place], [place] [address]  CBC     Status: Abnormal   Collection Time: 12/03/22  3:06 AM  Result Value Ref Range   WBC 11.5 (H) 4.0 - 10.5 K/uL   RBC 3.22 (L) 4.22 - 5.81 MIL/uL   Hemoglobin 9.4 (L) 13.0 - 17.0 g/dL   HCT 54.0 (L) 98.1 - 19.1 %   MCV 92.9 80.0 - 100.0 fL   MCH 29.2 26.0 - 34.0 pg   MCHC 31.4 30.0 - 36.0 g/dL   RDW 47.8 (H) 29.5 - 62.1 %   Platelets 139 (L) 150 - 400 K/uL   nRBC 0.0 0.0 - 0.2 %    Comment: Performed at [facility], [address] [place], [place] [address]  Glucose, capillary     Status: None   Collection Time: 12/03/22  7:31 AM  Result Value Ref Range   Glucose-Capillary 98 70 - 99  mg/dL    Comment: Glucose reference range applies only to samples taken after fasting for at least 8 hours.  Glucose, capillary     Status: Abnormal   Collection Time: 12/03/22 11:19 AM  Result Value Ref Range   Glucose-Capillary 160 (H) 70 - 99 mg/dL    Comment: Glucose reference range applies only to samples taken after fasting for at least 8 hours.  Glucose, capillary     Status: Abnormal   Collection Time: 12/03/22  4:22 PM  Result Value Ref Range   Glucose-Capillary 101 (H) 70 - 99 mg/dL    Comment: Glucose reference range applies only to samples taken after fasting for at least 8 hours.  Glucose, capillary     Status: Abnormal   Collection Time: 12/03/22  9:45 PM  Result Value Ref Range   Glucose-Capillary 148 (H) 70 - 99 mg/dL    Comment: Glucose reference range applies only to samples taken after fasting for at least 8 hours.  Basic  metabolic panel     Status: Abnormal   Collection Time: 12/04/22  4:31 AM  Result Value Ref Range   Sodium 135 135 - 145 mmol/L   Potassium 4.5 3.5 - 5.1 mmol/L   Chloride 104 98 - 111 mmol/L   CO2 23 22 - 32 mmol/L   Glucose, Bld 104 (H) 70 - 99 mg/dL    Comment: Glucose reference range applies only to samples taken after fasting for at least 8 hours.   BUN 30 (H) 8 - 23 mg/dL   Creatinine, Ser 1.61 (H) 0.61 - 1.24 mg/dL   Calcium 8.6 (L) 8.9 - 10.3 mg/dL   GFR, Estimated 49 (L) >60 mL/min    Comment: (NOTE) Calculated using the CKD-EPI Creatinine Equation (2021)    Anion gap 8 5 - 15    Comment: Performed at [facility], [address], [place], [place] [address]  CBC     Status: Abnormal   Collection Time: 12/04/22  4:31 AM  Result Value Ref Range   WBC 12.4 (H) 4.0 - 10.5 K/uL   RBC 3.23 (L) 4.22 - 5.81 MIL/uL   Hemoglobin 9.7 (L) 13.0 - 17.0 g/dL   HCT 54.0 (L) 98.1 - 19.1 %   MCV 91.6 80.0 - 100.0 fL   MCH 30.0 26.0 - 34.0 pg   MCHC 32.8 30.0 - 36.0 g/dL   RDW 47.8 (H) 29.5 - 62.1 %   Platelets 156 150 - 400 K/uL   nRBC  0.0 0.0 - 0.2 %    Comment: Performed at [facility], [address], [place], [place] [address]  Glucose, capillary     Status: None   Collection Time: 12/04/22  7:57 AM  Result Value Ref Range   Glucose-Capillary 94 70 - 99 mg/dL    Comment: Glucose reference range applies only to samples taken after fasting for at least 8 hours.   Comment 1 Notify RN    Comment 2 Document in Chart   Glucose, capillary     Status: None   Collection Time: 12/04/22 11:18 AM  Result Value Ref Range   Glucose-Capillary 95 70 - 99 mg/dL    Comment: Glucose reference range applies only to samples taken after fasting for at least 8 hours.   Comment 1 Notify RN    Comment 2 Document in Chart    *Note: Due to a large number of results and/or encounters for the requested time period, some results have not been displayed. A complete set of results can be found in Results Review.   No results found.    Blood pressure (!) 124/55, pulse 82, temperature 98.6 F (37 C), temperature source Oral, resp. rate 18, height 5\' 5"  (1.651 m), weight 77.2 kg, SpO2 92 %.  Medical Problem List and Plan: 1. Functional deficits secondary to left greater trochanter fracture  -pt with prior left BKA and prosthesis- incorporate donning/doffing, use of prosthetic into therapy plan/goals. Socket a little tighter than usual due to sl swelling in LLE from fracture and not wearing prosthesis as much.  -patient may  shower  -ELOS/Goals: 10 days, mod I to supervision with PT, OT   2.  Antithrombotics: -DVT/anticoagulation:  Pharmaceutical: Eliquis  -antiplatelet therapy:   3. Pain Management: Tylenol as needed for mild pain  -hydrocodone prn for severe pain  -robaxin prn for spasms  -prn ice  4. Mood/Behavior/Sleep: LCSW to evaluate and provide emotional support  -antipsychotic agents: n/a  5. Neuropsych/cognition: This patient  is capable of making decisions on his own behalf.  6. Skin/Wound Care: Routine skin care  checks   7. Fluids/Electrolytes/Nutrition: Routine Is and Os and follow-up chemistries  -continue vitamin B12 supplements, oral irom  8: Hypertension: monitor TID and prn (lasix 40 mg not restarted)  -continue amlodipine 10 mg daily  -continue atenolol 25 mg daily  -continue hydralazine 75 mg BID  9: Hyperlipidemia: continue statin  10: Atrial fibrillation, chronic: rate controlled; continue Eliquis, BB  11: CAD: stable; s/p CABG 2000; balloon angioplasty 2003  -follows with Dr. Tenny Craw  12: OSA:   13: Anemia: continue oral iron; follow-up CBC  14: Left greater trochanter displacement; non-opertive management  -WBAT  15: Urinary retention/BPH: continue Proscar and Flomax  -monitor output/PVR's  -OOB to void when possible 16: DM-2: CBGs QID; carb modified diet; A1c 4.8% on 4/08 (diet controlled at home)  -on SSI but not requiring coverage  17: AKI atop CKD 3: baseline creatinine ~1.3 ??  -follow-up BMP  18: PAD: s/p left BKA 2019 Dr. Wyn Quaker  -continue Trental  19: Leukocytosis: afebrile; on Zosyn for UTI, started today 4/26  -follow-up sensitives -follow-up CBC  21: Pseudomonas (and E Coli) UTI  -continue Zosyn iv x 3 days at least then po cipro x 3 more days per [facility]      Milinda Antis, PA-C 12/04/2022

## 2022-12-04 NOTE — Progress Notes (Signed)
Inpatient Rehab Admissions Coordinator:   I have a Cir bed for this Pt. Today. RN may call report to [phone].    Megan Salon, MS, CCC-SLP Rehab Admissions Coordinator  [phone] (celll) [phone] (office)

## 2022-12-04 NOTE — Progress Notes (Signed)
INPATIENT REHABILITATION ADMISSION NOTE   Arrival Method: Bed     Mental Orientation: Oreinted X4    Assessment: done   Skin: done   IV'S: infiltrated. IV team consulted for new line   Pain: none   Tubes and Drains: none   Safety Measures: done   Vital Signs: done   Height and Weight: done   Rehab Orientation: done   Family: at bedside     Notes: done  Marylu Lund, RN

## 2022-12-04 NOTE — Progress Notes (Signed)
Physical Therapy Treatment Patient Details Name: Eric [name]. MRN: [id number] DOB: September 03, 1927 Today's Date: 12/04/2022   History of Present Illness Pt is a 89 y.o. male admitted 11/30/22 after sudden onset L hip pain resulted in fall causing L greater trochanteric fx. Per ortho, plan for non-operative management WBAT with no active hip abduction. PMH includes L BKA (2019), HTN, DM2, CKD 3, CAD s/p CABG, afib on Eliquis, BPH, HLD.    PT Comments    Sat up in recliner for a couple of hours following OT session this morning. Eager to stand and change position with PT, decided to get back in bed following session due to malaise and increased Lt hip pain today suspect UTI contributing. Able to stand from recliner with min assist, very little WB through LLE while wearing prosthesis. Min assist with small pivotal steps to bed, requires extra time but gradually putting more pressure through LLE. Good control with descent. Son present and very supportive. Repositioned comfortably in bed with ice applied to Lt hip. Patient will continue to benefit from skilled physical therapy services to further improve independence with functional mobility. Patient will benefit from intensive inpatient follow up therapy, >3 hours/day    Recommendations for follow up therapy are one component of a multi-disciplinary discharge planning process, led by the attending physician.  Recommendations may be updated based on patient status, additional functional criteria and insurance authorization.  Follow Up Recommendations       Assistance Recommended at Discharge Frequent or constant Supervision/Assistance  Patient can return home with the following Assistance with cooking/housework;Assist for transportation;Help with stairs or ramp for entrance;A little help with walking and/or transfers;A little help with bathing/dressing/bathroom   Equipment Recommendations  Other (comment) (defer to next venue; well-equipped)     Recommendations for Other Services       Precautions / Restrictions Precautions Precautions: Fall;Other (comment) Precaution Comments: h/o L BKA (does have prosthetic in room) Restrictions Weight Bearing Restrictions: Yes LLE Weight Bearing: Weight bearing as tolerated Other Position/Activity Restrictions: per secure chat with ortho PA Charma Igo) 12/02/22 8A - confirmed LLE WBAT, no active hip abduction     Mobility  Bed Mobility Overal bed mobility: Needs Assistance Bed Mobility: Sit to Supine       Sit to supine: Min assist, HOB elevated   General bed mobility comments: Min assist to support LLE back into bed. VC for technique, some challenge sequencing however may be more due to guarding of Lt hip. Cues to avoid active hip abduction per order.    Transfers Overall transfer level: Needs assistance Equipment used: Rolling walker (2 wheels) Transfers: Sit to/from Stand, Bed to chair/wheelchair/BSC Sit to Stand: Min assist   Step pivot transfers: Min assist       General transfer comment: Min assist for balance with transition from hands on arm rests of chair to walker. Limited WB tolerated on Lt, guarded. Cues for sequencing throughout. Min assist for pivot and small step pivot steps to bed from recliner, light WB through LLE tolerated with prosthesis on. Cues for sequencing, upright posture and UE engagement for increased support. Good control with descent onto bed.    Ambulation/Gait               General Gait Details: Deferred due to increased pain this afternoon - suspect UTI contributing to malaise   Stairs             Wheelchair Mobility    Modified Rankin (Stroke Patients Only)  Balance Overall balance assessment: Needs assistance Sitting-balance support: No upper extremity supported, Feet supported Sitting balance-Leahy Scale: Fair Sitting balance - Comments: sits unsupported EOB   Standing balance support: Bilateral upper  extremity supported, During functional activity, Reliant on assistive device for balance Standing balance-Leahy Scale: Poor                              Cognition Arousal/Alertness: Awake/alert Behavior During Therapy: WFL for tasks assessed/performed Overall Cognitive Status: Within Functional Limits for tasks assessed                                 General Comments: Moving slower, takes a bit of time to think through things today. Could be IV pain med v UTI v increased pain today.        Exercises      General Comments        Pertinent Vitals/Pain Pain Assessment Pain Assessment: Faces Faces Pain Scale: Hurts whole lot Pain Location: L hip with movement and WB Pain Descriptors / Indicators: Discomfort, Grimacing, Guarding, Sharp Pain Intervention(s): Limited activity within patient's tolerance, Monitored during session, Premedicated before session, Repositioned, Patient requesting pain meds-RN notified    Home Living Family/patient expects to be discharged to:: Other (Comment) Living Arrangements: Alone Available Help at Discharge: Family;Available PRN/intermittently (son that checks in regularly) Type of Home: House Home Access: Level entry     Alternate Level Stairs-Number of Steps: stair lift from basement entrance (level entry) to main level Home Layout: Two level Home Equipment: Agricultural consultant (2 wheels);Rollator (4 wheels);Wheelchair - Engineer, technical sales - power;Shower seat;Hand held shower head;Grab bars - tub/shower Additional Comments: wife mod indep with SPC; daughter and son live nearby, check on pt near daily    Prior Function            PT Goals (current goals can now be found in the care plan section) Acute Rehab PT Goals Patient Stated Goal: regain independence PT Goal Formulation: With patient Time For Goal Achievement: 12/16/22 Potential to Achieve Goals: Good Progress towards PT goals: Progressing toward goals     Frequency    Min 5X/week      PT Plan Current plan remains appropriate    Co-evaluation              AM-PAC PT "6 Clicks" Mobility   Outcome Measure  Help needed turning from your back to your side while in a flat bed without using bedrails?: A Lot Help needed moving from lying on your back to sitting on the side of a flat bed without using bedrails?: A Lot Help needed moving to and from a bed to a chair (including a wheelchair)?: A Little Help needed standing up from a chair using your arms (e.g., wheelchair or bedside chair)?: A Little Help needed to walk in hospital room?: Total Help needed climbing 3-5 steps with a railing? : Total 6 Click Score: 12    End of Session Equipment Utilized During Treatment: Gait belt;Other (comment) (LLE prosthesis) Activity Tolerance: Patient limited by pain Patient left: with call bell/phone within reach;with family/visitor present;in bed;with bed alarm set;with nursing/sitter in room Nurse Communication: Mobility status PT Visit Diagnosis: Other abnormalities of gait and mobility (R26.89);Muscle weakness (generalized) (M62.81);Difficulty in walking, not elsewhere classified (R26.2);Pain Pain - Right/Left: Left Pain - part of body: Hip     Time: 1610-9604 PT Time Calculation (  min) (ACUTE ONLY): 21 min  Charges:  $Therapeutic Activity: 8-22 mins                     Kathlyn Sacramento, PT, DPT Physical Therapist Acute Rehabilitation Services [facility] [phone]    Berton Mount 12/04/2022, 3:24 PM

## 2022-12-04 NOTE — Progress Notes (Signed)
Pharmacy Antibiotic Note  Eric [name]. is a 89 y.o. male admitted on 11/30/2022 with for a mechanical fall now showing positive urine cultures.  Pharmacy has been consulted for zosyn dosing. Noted plans to follow up with cipro  Plan: Zosyn 3.375gm continuous infusion IV q8h  x 3 days  Height: 5\' 5"  (165.1 cm) Weight: 77.2 kg (170 lb 3.1 oz) IBW/kg (Calculated) : 61.5  Temp (24hrs), Avg:98.5 F (36.9 C), Min:98.3 F (36.8 C), Max:98.7 F (37.1 C)  Recent Labs  Lab 11/30/22 2050 12/01/22 0645 12/02/22 0245 12/03/22 0306 12/04/22 0431  WBC 9.4 11.1* 12.3* 11.5* 12.4*  CREATININE 1.87* 1.60* 1.43* 1.31* 1.36*    Estimated Creatinine Clearance: 33.2 mL/min (A) (by C-G formula based on SCr of 1.36 mg/dL (H)).    Antimicrobials this admission: None  Microbiology results: 4/23 UCx: Pseud >100K panS; ESBL Ecoli 10K S zosyn  Thank you for allowing pharmacy to be a part of this patient's care.  Caryl Asp, PharmD Clinical Pharmacist 12/04/2022 12:16 PM

## 2022-12-04 NOTE — Progress Notes (Signed)
PMR Admission Coordinator Pre-Admission Assessment   Patient: Eric [name]. is an 89 y.o., male MRN: [id number] DOB: February 01, 1928 Height: 5\' 5"  (165.1 cm) Weight: 77.2 kg   Insurance Information HMO:     PPO: yes     PCP:      IPA:      80/20:      OTHER:  PRIMARY: [employer]      Policy#: [id number]      Subscriber: pt CM Name: Judeth Cornfield      Phone#: [phone]     Fax#: epic access Pre-Cert#: [id number]      Employer:  Judeth Cornfield with [employer] called with approval for 7 days on 12/04/22 Benefits:  Phone #: [phone]     Name:  Eff. Date: 08/10/22     Deduct: $0      Out of Pocket Max: $3200 ($65 met)      Life Max:  CIR: $295/day for days 1-5      SNF: 20 full days Outpatient:      Co-Pay: $15/visit Home Health: 100%      Co-Pay:  DME: 80%     Co-Pay: 20% Providers:  SECONDARY:       Policy#:      Phone#:    Artist:       Phone#:    The Engineer, materials Information Summary" for patients in Inpatient Rehabilitation Facilities with attached "Privacy Act Statement-Health Care Records" was provided and verbally reviewed with: Patient and Family   Emergency Contact Information Contact Information       Name Relation Home Work Mobile    Bradner Son     [phone]    Matich,Lillie Spouse [phone]   [phone]    Mamie Laurel Daughter     [phone]    Yeison, Sippel     [phone]           Current Medical History  Patient Admitting Diagnosis: hip fracture    History of Present Illness: Pt is a 89 y/o male with PMH of L BKA (2019), HTN, DM, CKD, CAD s/p CABG, AF (on eliquis), BPH, and HLD.  He was admitted to [facility] on 11/30/22 following a fall at home precipitated by sudden hip pain in his L hip.  After lowering to the ground he was unable to stand 2/2 L hip pain.  Vitals in the ED unremarkable.  Labs notable for Hgb 10.9, creatinine 1.87.  Hip xray showed minimally displaced fracture through left femoral greater trochanter.  Orthopedics  was consulted and recommended CT, which showed greater trochanter fracture that is displaced by 1cm with no evidence of IT fracture. Recommended MRI for further assessment which showed acute fracture of the greater trochanter with superior displacement of fragments up to 2 cm and surrounding hematoma.  Orthopedics recommended nonoperative management and WBAT, with NO hip abduction.  Therapy ongoing and pt was recommended for CIR.     Patient's medical record from [facility] has been reviewed by the rehabilitation admission coordinator and physician.   Past Medical History      Past Medical History:  Diagnosis Date   Atrial flutter      s/p RFCA   CAD (coronary artery disease)      cath 2003, occluded S-RCA, occluded S-Dx, L-LAD ok, s/p PTCA to LAD   Cataract     CKD (chronic kidney disease) stage 3, GFR 30-59 ml/min     Diabetes mellitus      diet controlled   Hearing  aid worn      bilateral   History of kidney stones     Long term (current) use of anticoagulants     Neuromuscular disorder     OSA (obstructive sleep apnea) 12/11    very mild, AHI 7/hr   Persistent atrial fibrillation 09/26/2015   Pure hyperglyceridemia     PVD (peripheral vascular disease)      angioplasty of his right lower extremity in [place] by Dr.[name] 2013   Unspecified essential hypertension     Wears dentures      full upper and lower      Has the patient had major surgery during 100 days prior to admission? No   Family History   family history includes Aneurysm in his father; Breast cancer in his sister; Hypertension in his father and mother; Liver cancer in his brother; Lung cancer in his brother and brother; Other in his brother and sister; Prostate cancer in his brother; Stroke in his father; Thrombosis in his brother.   Current Medications   Current Facility-Administered Medications:    acetaminophen (TYLENOL) tablet 650 mg, 650 mg, Oral, Q6H PRN, Earlie Lou L, MD, 650 mg at 12/01/22 0929    amLODipine (NORVASC) tablet 10 mg, 10 mg, Oral, Daily, Mikeal Hawthorne, Mohammad L, MD, 10 mg at 12/01/22 0929   atenolol (TENORMIN) tablet 25 mg, 25 mg, Oral, Daily, Mikeal Hawthorne, Mohammad L, MD, 25 mg at 12/01/22 1610   ferrous sulfate tablet 325 mg, 325 mg, Oral, QHS, Garba, Mohammad L, MD, 325 mg at 12/01/22 2110   finasteride (PROSCAR) tablet 5 mg, 5 mg, Oral, Daily, Mikeal Hawthorne, Mohammad L, MD, 5 mg at 12/02/22 1108   hydrALAZINE (APRESOLINE) tablet 75 mg, 75 mg, Oral, BID, Mikeal Hawthorne, Mohammad L, MD, 75 mg at 12/01/22 2110   insulin aspart (novoLOG) injection 0-5 Units, 0-5 Units, Subcutaneous, QHS, Garba, Mohammad L, MD   insulin aspart (novoLOG) injection 0-9 Units, 0-9 Units, Subcutaneous, TID WC, Garba, Mohammad L, MD, 1 Units at 12/02/22 1122   morphine (PF) 2 MG/ML injection 2 mg, 2 mg, Intravenous, Q2H PRN, Earlie Lou L, MD, 2 mg at 12/01/22 1947   ondansetron (ZOFRAN) tablet 4 mg, 4 mg, Oral, Q6H PRN **OR** ondansetron (ZOFRAN) injection 4 mg, 4 mg, Intravenous, Q6H PRN, Mikeal Hawthorne, Mohammad L, MD, 4 mg at 12/01/22 1212   rosuvastatin (CRESTOR) tablet 40 mg, 40 mg, Oral, Daily, Garba, Mohammad L, MD, 40 mg at 12/02/22 1107   tamsulosin (FLOMAX) capsule 0.4 mg, 0.4 mg, Oral, Daily, Mikeal Hawthorne, Mohammad L, MD, 0.4 mg at 12/02/22 1108   Patients Current Diet:  Diet Order                  Diet heart healthy/carb modified Room service appropriate? Yes; Fluid consistency: Thin  Diet effective now                         Precautions / Restrictions Precautions Precautions: Fall, Other (comment) Precaution Comments: h/o L BKA (does not have prosthetic in room) Restrictions Weight Bearing Restrictions: Yes LLE Weight Bearing: Weight bearing as tolerated Other Position/Activity Restrictions: per secure chat with ortho PA Charma Igo) 12/02/22 8A - confirmed LLE WBAT, no active hip abduction    Has the patient had 2 or more falls or a fall with injury in the past year? Yes   Prior Activity Level Limited  Community (1-2x/wk): indep prior to admit, driving, using a SPC and prosthetic for mobility, no assist for ADLs/iADLs  Prior Functional Level Self Care: Did the patient need help bathing, dressing, using the toilet or eating? Independent   Indoor Mobility: Did the patient need assistance with walking from room to room (with or without device)? Independent   Stairs: Did the patient need assistance with internal or external stairs (with or without device)? Independent   Functional Cognition: Did the patient need help planning regular tasks such as shopping or remembering to take medications? Independent   Patient Information Are you of Hispanic, Latino/a,or Spanish origin?: A. No, not of Hispanic, Latino/a, or Spanish origin What is your race?: A. White Do you need or want an interpreter to communicate with a doctor or health care staff?: 0. No   Patient's Response To:  Health Literacy and Transportation Is the patient able to respond to health literacy and transportation needs?: Yes Health Literacy - How often do you need to have someone help you when you read instructions, pamphlets, or other written material from your doctor or pharmacy?: Never In the past 12 months, has lack of transportation kept you from medical appointments or from getting medications?: No In the past 12 months, has lack of transportation kept you from meetings, work, or from getting things needed for daily living?: No   Home Assistive Devices / Equipment Home Assistive Devices/Equipment: Medical laboratory scientific officer (specify quad or straight), Dentures (specify type), Eyeglasses, Prosthesis, Walker (specify type), Wheelchair Home Equipment: Agricultural consultant (2 wheels), Rollator (4 wheels), Wheelchair - manual, Wheelchair - power, Information systems manager, Hand held shower head, Grab bars - tub/shower   Prior Device Use: Indicate devices/aids used by the patient prior to current illness, exacerbation or injury? Walker, Orthotics/Prosthetics, and cane    Current Functional Level Cognition   Overall Cognitive Status: Within Functional Limits for tasks assessed Orientation Level: Oriented X4 General Comments: WFL for simple tasks, not formally assesesd. good sense of humor; sequencing mobility very well with minimal cuing    Extremity Assessment (includes Sensation/Coordination)   Upper Extremity Assessment: Overall WFL for tasks assessed RUE Deficits / Details: h/o R hand numbness and noted arthritic changes in fingers  Lower Extremity Assessment: Defer to PT evaluation LLE Deficits / Details: h/o L BKA; hip and knee flex/ext >/ 3/5 strength     ADLs   Overall ADL's : Needs assistance/impaired Eating/Feeding: Set up, Sitting Grooming: Wash/dry hands, Wash/dry face, Set up, Sitting Grooming Details (indicate cue type and reason): EOB Upper Body Bathing: Moderate assistance Upper Body Bathing Details (indicate cue type and reason): for back Lower Body Bathing: Moderate assistance Lower Body Bathing Details (indicate cue type and reason): assist for knees down Upper Body Dressing : Moderate assistance, Sitting Upper Body Dressing Details (indicate cue type and reason): UE needed to support balance at this time Lower Body Dressing: Maximal assistance, Bed level, Sitting/lateral leans Toilet Transfer: Moderate assistance, +2 for physical assistance, +2 for safety/equipment, Stand-pivot, BSC/3in1, Rolling walker (2 wheels) Toilet Transfer Details (indicate cue type and reason): assist for boost Toileting- Clothing Manipulation and Hygiene: Maximal assistance Functional mobility during ADLs: Moderate assistance, +2 for physical assistance, +2 for safety/equipment, Cueing for safety, Cueing for sequencing, Rolling walker (2 wheels) General ADL Comments: decreased access to LB for ADL, pain, decreased knowledge of AE, balance     Mobility   Overal bed mobility: Needs Assistance Bed Mobility: Supine to Sit Supine to sit: Mod assist, +2 for  safety/equipment, HOB elevated General bed mobility comments: modA+2 for trunk elevation and scooting L hip towards EOB, increased time and effort secondary to  pain     Transfers   Overall transfer level: Needs assistance Equipment used: Rolling walker (2 wheels) Transfers: Sit to/from Stand, Bed to chair/wheelchair/BSC Sit to Stand: Mod assist, +2 physical assistance Bed to/from chair/wheelchair/BSC transfer type:: Step pivot Step pivot transfers: Mod assist, +2 physical assistance General transfer comment: pt able to stand on second attempt from elevated bed height to RW with modA+2, pt able to minimally hop on RLE with modA for stability and RW management in order to hop pivotally from bed>recliner; modA for eccentric control to sit     Ambulation / Gait / Stairs / Wheelchair Mobility         Posture / Balance Dynamic Sitting Balance Sitting balance - Comments: initial modA for sitting balance progressing to min guard with intermittent UE support Balance Overall balance assessment: Needs assistance Sitting balance-Leahy Scale: Fair Sitting balance - Comments: initial modA for sitting balance progressing to min guard with intermittent UE support Standing balance support: Bilateral upper extremity supported, Reliant on assistive device for balance Standing balance-Leahy Scale: Poor Standing balance comment: reliant on BUE support and external assist standing with RW on RLE     Special needs/care consideration Diabetic management yes    Previous Home Environment (from acute therapy documentation) Living Arrangements: Spouse/significant other Available Help at Discharge: Family, Available PRN/intermittently (son that checks in regularly) Type of Home: House Home Layout: Two level Alternate Level Stairs-Number of Steps: stair lift from basement entrance (level entry) to main level Home Access: Level entry Bathroom Shower/Tub: Health visitor: Handicapped height Home  Care Services: Yes Type of Home Care Services: Home RN Home Care Agency (if known): [facility] Additional Comments: wife mod indep with SPC; daughter and son live nearby, check on pt near daily   Discharge Living Setting Plans for Discharge Living Setting: Patient's home, Lives with (comment) (spouse) Type of Home at Discharge: House Discharge Home Layout: Two level, Able to live on main level with bedroom/bathroom, Other (Comment) (stair lift from basement to main level) Discharge Home Access: Level entry Discharge Bathroom Shower/Tub: Walk-in shower Discharge Bathroom Toilet: Handicapped height Discharge Bathroom Accessibility: Yes How Accessible: Accessible via walker Does the patient have any problems obtaining your medications?: No   Social/Family/Support Systems Patient Roles: Spouse Anticipated Caregiver: sons/daughters/spouse Anticipated Caregiver's Contact Information: [name] (main contact [phone]); Tresa Endo (son) [phone]; Clydie Braun (dtr) [phone] Ability/Limitations of Caregiver: spouse cannot provide assist, children can provide supervision if needed Caregiver Availability: 24/7 Discharge Plan Discussed with Primary Caregiver: Yes Is Caregiver In Agreement with Plan?: Yes Does Caregiver/Family have Issues with Lodging/Transportation while Pt is in Rehab?: No   Goals Patient/Family Goal for Rehab: PT/OT supervision to mod I, SLP n/a Expected length of stay: 10-12 days Additional Information: Discharge plan: return to pt's home where he lives with his spouse, they have several children in the area who can provide 24/7 supervision if needed at discharge Pt/Family Agrees to Admission and willing to participate: Yes Program Orientation Provided & Reviewed with Pt/Caregiver Including Roles  & Responsibilities: Yes Additional Information Needs: Dr. Carlis Abbott reviewed on 12/02/22   Decrease burden of Care through IP rehab admission: n/a   Possible need for SNF placement upon  discharge: Not anticipated.  Plan for d/c to pt's home at intermittent mod I level with spouse and pt's children planning to provide any supervision needed.    Patient Condition: I have reviewed medical records from [facility], spoken with CM, and patient and son. I met with patient at the bedside for  inpatient rehabilitation assessment.  Patient will benefit from ongoing PT and OT, can actively participate in 3 hours of therapy a day 5 days of the week, and can make measurable gains during the admission.  Patient will also benefit from the coordinated team approach during an Inpatient Acute Rehabilitation admission.  The patient will receive intensive therapy as well as Rehabilitation physician, nursing, social worker, and care management interventions.  Due to safety, disease management, medication administration, pain management, and patient education the patient requires 24 hour a day rehabilitation nursing.  The patient is currently mod assist with mobility and basic ADLs.  Discharge setting and therapy post discharge at home with home health is anticipated.  Patient has agreed to participate in the Acute Inpatient Rehabilitation Program and will admit today.   Preadmission Screen Completed By:  Stephania Fragmin, PT, DPT 12/02/2022 4:25 PM ______________________________________________________________________   Discussed status with Dr. Riley Kill on 12/04/22  at 11:36 AM  and received approval for admission today.   Admission Coordinator:  Stephania Fragmin, PT, DPT time 11:36 AM Dorna Bloom 12/04/22     Assessment/Plan: Diagnosis: left greater troch hip fx Does the need for close, 24 hr/day Medical supervision in concert with the patient's rehab needs make it unreasonable for this patient to be served in a less intensive setting? Yes Co-Morbidities requiring supervision/potential complications: Left BKA with prosthesis, HTN, DM, CKD, bac uti Due to bladder management, bowel management, safety, skin/wound  care, disease management, medication administration, pain management, and patient education, does the patient require 24 hr/day rehab nursing? Yes Does the patient require coordinated care of a physician, rehab nurse, PT, OT to address physical and functional deficits in the context of the above medical diagnosis(es)? Yes Addressing deficits in the following areas: balance, endurance, locomotion, strength, transferring, bowel/bladder control, bathing, dressing, feeding, grooming, toileting, and psychosocial support Can the patient actively participate in an intensive therapy program of at least 3 hrs of therapy 5 days a week? Yes The potential for patient to make measurable gains while on inpatient rehab is excellent Anticipated functional outcomes upon discharge from inpatient rehab: modified independent and supervision PT, modified independent and supervision OT, modified independent and supervision SLP Estimated rehab length of stay to reach the above functional goals is: 10 days Anticipated discharge destination: Home 10. Overall Rehab/Functional Prognosis: excellent     MD Signature: Ranelle Oyster, MD, [facility] Physical Medicine & Rehabilitation Medical Director Rehabilitation Services 12/04/2022

## 2022-12-04 NOTE — Progress Notes (Signed)
PROGRESS NOTE Eric [name].  ZOX:[id number] DOB: 1927/09/30 DOA: 11/30/2022 PCP: [name], MD  Brief Narrative/Hospital Course: 89 y.o. male with medical history significant of diet-controlled diabetes, coronary artery disease, essential hypertension, obstructive sleep apnea, peripheral vascular disease status post BKA on the left, essential hypertension, atrial fibrillation presented with mechanical fall and left hip pain.  He was found to have nondisplaced fracture of the left greater trochanter.  Orthopedics was consulted  and admitted. Seen by Dr. Victorino Dike had MRI of the left hip showing acute fracture of the greater trochanter with superior displacement up to 2 cm, mild bilateral hip osteoarthritis no other fractures.  Discussed Dr. Victorino Dike 4/25- plan for non operative treatment, resumed Eliquis and cont weightbearing as tolerated.  PT OT has evaluated and recommending inpatient rehab. Awaiting CIR    Subjective: Patient seen and examined this morning resting comfortably he does have pain on his left lateral thigh on palpation and movement Overnight afebrile BP stable Labs w/ creat 1.3, stbale hb at 9.7 g, wbc at 12.5   Assessment and Plan: Principal Problem:   Closed left femoral fracture (HCC) Active Problems:   Essential hypertension   Obstructive sleep apnea   Chronic coronary artery disease   Type 2 diabetes mellitus (HCC)   PVD (peripheral vascular disease) (HCC)   Chronic anticoagulation   Atrial flutter (HCC)   Hx of BKA, left (HCC)   Acute kidney injury superimposed on CKD (HCC)   Acute left greater trochanter fracture displaced, secondary to mechanical fall w/ lt BKA: Seen by Dr. Victorino Dike got MRI of the left hip- plan for non operative treatment, eliquis resumed. Cont ptot CIR pending. Cont pain control  IV/PO opiates and nonopiates, bowel regimen.  Chronic atrial fibrillation--rate controlled. Cont atenolol and AC w/ home Eliquis    Hypertension Hyperlipidemia CAD with history of CABG: BP stable.No chest pain.  Continue amlodipine, atenolol, hydralazine as BP tolerates. Continue statin. Lasix on hold will resume on discharge   AKI on chronic kidney disease stage IIIb:Creatinine at 1.8>1.6>>1.3 stable- monitor Recent Labs    09/28/22 1024 10/05/22 1412 10/19/22 1422 11/16/22 1113 11/23/22 1003 11/30/22 2050 12/01/22 0645 12/02/22 0245 12/03/22 0306 12/04/22 0431  BUN 28 24 31* 19 29* 33* 30* 30* 27* 30*  CREATININE 2.50* 1.21 1.71* 1.35* 1.70* 1.87* 1.60* 1.43* 1.31* 1.36*    BPH--continue tamsulosin   T2DM:well controlled on SSI Recent Labs  Lab 12/03/22 1119 12/03/22 1622 12/03/22 2145 12/04/22 0757 12/04/22 1118  GLUCAP 160* 101* 148* 94 95   Anemia chronic disease:From chronic illnesses.  Hemoglobin stable in 9s trend  Mild thrombocytopenia in the setting of acute illness-resolved  Peripheral arterial disease status post left BKA: cont trental and crestor  UTI- has Pyuria/bacteriuria: WBC>50 moderate leukocytes nitrate negative-100,000 Pseudomonas  and 10,000 E. Coli, labs has leucocytosis. When asked today he says he had  difficulty initialing urine, some dysuria. We will give zosyn iv x 3 days at least then po cipro x 3 more days. Son agreeable  DVT prophylaxis: apixaban (ELIQUIS) tablet 2.5 mg Start: 12/03/22 1230 SCDs Start: 12/01/22 0353 Code Status:   Code Status: Full Code Family Communication: plan of care discussed with patient at bedside. Patient status is: Inpatient because of due to fracture and fall Level of care: Med-Surg   Dispo: The patient is from: home            Anticipated disposition: awaiting CIR Objective: Vitals last 24 hrs: Vitals:   12/03/22 1446 12/03/22 2125  12/04/22 0435 12/04/22 0800  BP: (!) 123/54 128/64 (!) 141/62 (!) 124/55  Pulse: (!) 47 66 60 82  Resp: 18 17 16 18   Temp: 98.4 F (36.9 C) 98.3 F (36.8 C) 98.7 F (37.1 C) 98.6 F (37 C)   TempSrc: Oral Oral Oral Oral  SpO2: 100% 95% 95% 92%  Weight:      Height:       Weight change:   Physical Examination: General exam: AAOx3, weak,older appearing HEENT:Oral mucosa moist, Ear/Nose WNL grossly, dentition normal. Respiratory system: bilaterally diminished BS, no use of accessory muscle Cardiovascular system: S1 & S2 +, regular rate Gastrointestinal system: Abdomen soft, NT,ND,BS+ Nervous System:Alert, awake, moving extremities and grossly nonfocal Extremities: Lt BKA w/ tender left lateral thigh Skin: No rashes,no icterus. MSK: Normal muscle bulk,tone, power   Medications reviewed:  Scheduled Meds:  amLODipine  10 mg Oral Daily   apixaban  2.5 mg Oral BID   atenolol  25 mg Oral Daily   cyanocobalamin  500 mcg Oral Daily   ferrous sulfate  325 mg Oral QHS   finasteride  5 mg Oral Daily   hydrALAZINE  75 mg Oral BID   insulin aspart  0-5 Units Subcutaneous QHS   insulin aspart  0-9 Units Subcutaneous TID WC   pentoxifylline  400 mg Oral Q12H   rosuvastatin  40 mg Oral Daily   tamsulosin  0.4 mg Oral Daily   Continuous Infusions:    Diet Order             Diet heart healthy/carb modified Room service appropriate? Yes; Fluid consistency: Thin  Diet effective now                   Intake/Output Summary (Last 24 hours) at 12/04/2022 1135 Last data filed at 12/03/2022 1700 Gross per 24 hour  Intake 480 ml  Output 700 ml  Net -220 ml   Net IO Since Admission: 2,011.03 mL [12/04/22 1135]  Wt Readings from Last 3 Encounters:  12/01/22 77.2 kg  11/16/22 75.7 kg  09/28/22 75.6 kg     Unresulted Labs (From admission, onward)     Start     Ordered   12/03/22 0500  Basic metabolic panel  Daily,   R      12/02/22 1135   12/03/22 0500  CBC  Daily,   R      12/02/22 1135          Data Reviewed: I have personally reviewed following labs and imaging studies CBC: Recent Labs  Lab 11/30/22 2050 12/01/22 0645 12/02/22 0245 12/03/22 0306  12/04/22 0431  WBC 9.4 11.1* 12.3* 11.5* 12.4*  NEUTROABS 5.5  --  7.2  --   --   HGB 10.9* 10.2* 9.8* 9.4* 9.7*  HCT 34.0* 32.7* 29.4* 29.9* 29.6*  MCV 92.9 93.2 91.9 92.9 91.6  PLT 178 158 136* 139* 156   Basic Metabolic Panel: Recent Labs  Lab 11/30/22 2050 12/01/22 0645 12/02/22 0245 12/03/22 0306 12/04/22 0431  NA 138 139 133* 134* 135  K 3.7 3.7 4.6 4.4 4.5  CL 102 107 101 101 104  CO2 27 25 24 25 23   GLUCOSE 124* 80 92 93 104*  BUN 33* 30* 30* 27* 30*  CREATININE 1.87* 1.60* 1.43* 1.31* 1.36*  CALCIUM 8.9 8.6* 8.6* 8.6* 8.6*  MG  --   --  1.7  --   --    GFR: Estimated Creatinine Clearance: 33.2 mL/min (A) (by C-G formula  based on SCr of 1.36 mg/dL (H)). Liver Function Tests: Recent Labs  Lab 12/01/22 0645  AST 24  ALT 20  ALKPHOS 44  BILITOT 0.9  PROT 5.8*  ALBUMIN 2.7*  BNP (last 3 results) Recent Labs    09/28/22 1024 10/05/22 1412  PROBNP 2,173* 2,429*   HbA1C: No results for input(s): "HGBA1C" in the last 72 hours. CBG: Recent Labs  Lab 12/03/22 1119 12/03/22 1622 12/03/22 2145 12/04/22 0757 12/04/22 1118  GLUCAP 160* 101* 148* 94 95   Recent Results (from the past 240 hour(s))  Urine Culture     Status: Abnormal   Collection Time: 12/01/22  6:57 PM   Specimen: Urine, Catheterized  Result Value Ref Range Status   Specimen Description URINE, CATHETERIZED  Final   Special Requests   Final    NONE Performed at [facility], [address], [place], [place] [address]    Culture (A)  Final    >=100,000 COLONIES/mL PSEUDOMONAS AERUGINOSA 10,000 COLONIES/mL ESCHERICHIA COLI Confirmed Extended Spectrum Beta-Lactamase Producer (ESBL).  In bloodstream infections from ESBL organisms, carbapenems are preferred over piperacillin/tazobactam. They are shown to have a lower risk of mortality.    Report Status 12/04/2022 FINAL  Final   Organism ID, Bacteria PSEUDOMONAS AERUGINOSA (A)  Final   Organism ID, Bacteria ESCHERICHIA COLI (A)  Final       Susceptibility   Escherichia coli - MIC*    AMPICILLIN >=32 RESISTANT Resistant     CEFAZOLIN >=64 RESISTANT Resistant     CEFEPIME >=32 RESISTANT Resistant     CEFTRIAXONE >=64 RESISTANT Resistant     CIPROFLOXACIN >=4 RESISTANT Resistant     GENTAMICIN >=16 RESISTANT Resistant     IMIPENEM <=0.25 SENSITIVE Sensitive     NITROFURANTOIN 128 RESISTANT Resistant     TRIMETH/SULFA >=320 RESISTANT Resistant     AMPICILLIN/SULBACTAM >=32 RESISTANT Resistant     PIP/TAZO 16 SENSITIVE Sensitive     * 10,000 COLONIES/mL ESCHERICHIA COLI   Pseudomonas aeruginosa - MIC*    CEFTAZIDIME <=1 SENSITIVE Sensitive     CIPROFLOXACIN <=0.25 SENSITIVE Sensitive     GENTAMICIN <=1 SENSITIVE Sensitive     IMIPENEM 1 SENSITIVE Sensitive     PIP/TAZO <=4 SENSITIVE Sensitive     CEFEPIME 0.5 SENSITIVE Sensitive     * >=100,000 COLONIES/mL PSEUDOMONAS AERUGINOSA    Antimicrobials: Anti-infectives (From admission, onward)    None      Culture/Microbiology None this admission  No results found.   LOS: 4 days   Lanae Boast, MD [facility]  12/04/2022, 11:35 AM

## 2022-12-04 NOTE — Progress Notes (Addendum)
Inpatient Rehabilitation Admission Medication Review by a Pharmacist  A complete drug regimen review was completed for this patient to identify any potential clinically significant medication issues.  High Risk Drug Classes Is patient taking? Indication by Medication  Antipsychotic No   Anticoagulant Yes Apixaban- Afib  Antibiotic Yes, as an intravenous medication Zosyn- UTI (ESBL E. Coli) x 3 days , last dose due 4/29 6AM.   Opioid Yes Hydrocodone/APAP-pain management  Antiplatelet No   Hypoglycemics/insulin Yes Insulin aspart SSI- DM   Vasoactive Medication Yes Amlodipine, Atenolol, Hydralazine- HTN  Chemotherapy No   Other Yes Acetaminophen- pain management Cyanocobalamin - Vitamin B12 supplement Ferrous sulfate- chronic anemia. Pentoxifylline -PAD Methocarbamol- muscle spasms Proscar, Tamsulosin- BPH, urinary retention.  Rosuvastatin- HLD     Type of Medication Issue Identified Description of Issue Recommendation(s)  Drug Interaction(s) (clinically significant)     Duplicate Therapy     Allergy     No Medication Administration End Date     Incorrect Dose     Additional Drug Therapy Needed     Significant med changes from prior encounter (inform family/care partners about these prior to discharge). PTA furosemide noted to continue on discharge orders from [facility] acute admit. Not resumed on CIR admit. Restart PTA meds when and if necessary during CIR admission or at time of discharge, if warranted.   Other       Clinically significant medication issues were identified that warrant physician communication and completion of prescribed/recommended actions by midnight of the next day:  No  Name of provider notified for urgent issues identified:   Provider Method of Notification:     Pharmacist comments:   Time spent performing this drug regimen review (minutes):  20  Thank you for allowing pharmacy to be part of this patients care team.  Noah Delaine, RPh Clinical  Pharmacist 12/04/2022 8:57 PM

## 2022-12-04 NOTE — Plan of Care (Signed)
PT D/c to CIR. BP (!) 124/55 (BP Location: Right Arm)   Pulse 82   Temp 98.6 F (37 C) (Oral)   Resp 18   Ht 5\' 5"  (1.651 m)   Wt 77.2 kg   SpO2 92%   BMI 28.32 kg/m  Pain 10/10 morphine given after PT worked wit pt. No skin issues. Pt A&OX4. Takes meds whole. Left BKA with prostethesis. AVS sent with pt to CIR. Reva Bores 12/04/22 3:01 PM

## 2022-12-04 NOTE — Plan of Care (Signed)

## 2022-12-04 NOTE — H&P (Addendum)
Physical Medicine and Rehabilitation Admission H&P     CC: Functional deficits secondary to left greater trochanter fracture; prior left BKA   HPI: Eric [name], Eric [name] is a 89 year old male presented to the ED on 11/30/2022 complaining of left hip pain. He evidently got up from a chair, felt left hip pain and lowered himself to the floor. No other injury or LOC. He is s/p left BKA in 2019 and does use a prosthesis.  X-ray of patient's left hip shows positive fracture at the left greater trochanter.  Discussed with Dr. Victorino Dike from orthopedics and he saw the patient consultation.  Admitted to the medicine service. He is on Eliquis for atrial fibrillation. This was initially held for possible surgical intervention. Oral anti-hypotensives continued. Found to have AKI atop CKD stage III. Improved with hydration. PMH significant for chronic anemia, CAD, OSA, PVD, hyperlipidemia. DM-2 diet controlled.  UA positive for GNR and started on Zosyn today. Plan is to convert to Cipro. Follow-up culture results.  Able to stand from recliner with min assist, very little WB through LLE while wearing prosthesis. Min assist with small pivotal steps to bed, requires extra time but gradually putting more pressure through LLE. Good control with descent. The patient requires inpatient medicine and rehabilitation evaluations and services for ongoing dysfunction secondary to left greater trochanter fracture; prior left BKA.          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 Married x 73 years       5 kids    11 grandchildren.    20 great grandchilren.      Review of Systems  Constitutional:  Positive for malaise/fatigue.  HENT:  Negative for hearing loss.   Eyes:  Negative for blurred vision.  Respiratory:  Negative for cough.   Cardiovascular:  Negative for chest pain.  Gastrointestinal:  Negative for heartburn.  Genitourinary:  Positive for urgency.  Musculoskeletal:  Positive for joint pain and  myalgias.  Neurological:  Positive for weakness. Negative for dizziness.  Psychiatric/Behavioral:  Negative for depression.         Past Medical History:  Diagnosis Date   Atrial flutter (HCC)      s/p RFCA   CAD (coronary artery disease)      cath 2003, occluded S-RCA, occluded S-Dx, L-LAD ok, s/p PTCA to LAD   Cataract     CKD (chronic kidney disease) stage 3, GFR 30-59 ml/min (HCC)     Diabetes mellitus      diet controlled   Hearing aid worn      bilateral   History of kidney stones     Long term (current) use of anticoagulants     Neuromuscular disorder (HCC)     OSA (obstructive sleep apnea) 12/11    very mild, AHI 7/hr   Persistent atrial fibrillation (HCC) 09/26/2015   Pure hyperglyceridemia     PVD (peripheral vascular disease) (HCC)      angioplasty of his right lower extremity in [place] by Dr.[name] 2013   Unspecified essential hypertension     Wears dentures      full upper and lower         Past Surgical History:  Procedure Laterality Date   ABDOMINAL AORTOGRAM W/LOWER EXTREMITY N/A 11/15/2017    Procedure: ABDOMINAL AORTOGRAM W/LOWER EXTREMITY;  Surgeon: Sherren Kerns, MD;  Location: [facility];  Service: Cardiovascular;  Laterality: N/A;   Adenosine Myoview   3/06    EF  56%, neg. Ischemia   Adenosine Myoview   02/18/07    nml   AMPUTATION Left 12/15/2017    Procedure: AMPUTATION BELOW KNEE;  Surgeon: Annice Needy, MD;  Location: [facility] ORS;  Service: Vascular;  Laterality: Left;   ANGIOPLASTY   1/99    CAD- diogonal with rotational artherectomy   Arthrectomy        of LAD & PTCA   BLEPHAROPLASTY Bilateral     CARDIAC CATHETERIZATION   1/00   CARDIOVERSION   1/04   CARDIOVERSION   5/07    hospital- a flutter   CARPAL TUNNEL RELEASE        ? bilateral   CATARACT EXTRACTION W/PHACO Right 07/28/2017    Procedure: CATARACT EXTRACTION PHACO AND INTRAOCULAR LENS PLACEMENT (IOC) RIGHT DIABETIC;  Surgeon: Lockie Mola, MD;  Location: [facility];  Service: Ophthalmology;  Laterality: Right;  Diabetic - diet controlled   CATARACT EXTRACTION W/PHACO Left 08/18/2017    Procedure: CATARACT EXTRACTION PHACO AND INTRAOCULAR LENS PLACEMENT (IOC);  Surgeon: Lockie Mola, MD;  Location: [facility];  Service: Ophthalmology;  Laterality: Left;  DIABETES - oral meds   COLONOSCOPY W/ BIOPSIES   10/01/06    sigmoid polyp bx neg, 3 years   COLONOSCOPY WITH PROPOFOL N/A 04/28/2021    Procedure: COLONOSCOPY WITH PROPOFOL;  Surgeon: Iva Boop, MD;  Location: [facility] ENDOSCOPY;  Service: Endoscopy;  Laterality: N/A;   CORONARY ANGIOPLASTY   4/03    cutting balloon PTCA pLAD into Diag   CORONARY ARTERY BYPASS GRAFT   2000    LIMA-LAD, SVG-RCA, SVG-Diag; SVG-Diag & SVG-RCA occluded 2003   FRACTURE SURGERY       HAND SURGERY   08/27/09    R thumb procedure wit Scaphoid Gragt and screws, Dr Merlyn Lot   LOWER EXTREMITY ANGIOGRAPHY Right 01/25/2019    Procedure: LOWER EXTREMITY ANGIOGRAPHY;  Surgeon: Renford Dills, MD;  Location: [facility] INV[facility]IVE CV LAB;  Service: Cardiovascular;  Laterality: Right;   NM MYOVIEW LTD   4/11    normal   PERIPHERAL VASCULAR CATHETERIZATION Right 06/27/2015    Procedure: Lower Extremity Angiography;  Surgeon: Annice Needy, MD;  Location: [facility] INV[facility] CV [facility];  Service: Cardiovascular;  Laterality: Right;   PERIPHERAL VASCULAR CATHETERIZATION   06/27/2015    Procedure: Lower Extremity Intervention;  Surgeon: Annice Needy, MD;  Location: [facility] INV[facility] CV [facility];  Service: Cardiovascular;;   POLYPECTOMY   04/28/2021    Procedure: POLYPECTOMY;  Surgeon: Iva Boop, MD;  Location: [facility] ENDOSCOPY;  Service: Endoscopy;;         Family History  Problem Relation Age of Onset   Stroke Father     Aneurysm Father     Hypertension Father     Prostate cancer Brother     Hypertension Mother     Lung cancer Brother          smoker   Thrombosis Brother     Other Brother          RF valve disorder (smoker)   Lung  cancer Brother          smoker   Breast cancer Sister     Liver cancer Brother     Other Sister          cerebral hemorrhage    Social History:  reports that he has never smoked. He quit smokeless tobacco use about 30 years ago. He reports that he does not drink alcohol and does  not use drugs. Allergies:       Allergies  Allergen Reactions   Isosorbide Mononitrate Other (See Comments)      Unknown, doesn't remember    Ramipril Cough and Other (See Comments)   Quinidine Gluconate Rash   Quinidine Gluconate Other (See Comments) and Rash          Medications Prior to Admission  Medication Sig Dispense Refill   acetaminophen (TYLENOL) 325 MG tablet Take 2 tablets (650 mg total) by mouth every 6 (six) hours as needed for mild pain (or temp >/= 101 F).       amLODipine (NORVASC) 10 MG tablet Take 1 tablet (10 mg total) by mouth daily. 90 tablet 1   apixaban (ELIQUIS) 2.5 MG TABS tablet Take 1 tablet (2.5 mg total) by mouth 2 (two) times daily. OFFICE VISIT NEEDED FOR ADDITIONAL REFILLS 180 tablet 1   atenolol (TENORMIN) 25 MG tablet Take 1 tablet by mouth once daily 90 tablet 0   Cinnamon 500 MG TABS Take 500 mg by mouth daily.       Cranberry 500 MG CAPS Take 500 mg by mouth daily.       cyanocobalamin (VITAMIN B12) 500 MCG tablet Take 500 mcg by mouth daily.       ferrous sulfate 325 (65 FE) MG EC tablet Take 325 mg by mouth at bedtime.       finasteride (PROSCAR) 5 MG tablet TAKE 1 TABLET BY MOUTH ONCE DAILY (Patient taking differently: Take 5 mg by mouth daily.) 90 tablet 3   furosemide (LASIX) 40 MG tablet Take 1 tablet (40 mg total) by mouth daily. 90 tablet 0   hydrALAZINE (APRESOLINE) 25 MG tablet Take 3 tablets (75 mg total) by mouth in the morning and at bedtime. 180 tablet 3   Multiple Vitamin (DAILY MULTIVITAMIN PO) Take 1 tablet by mouth daily.       pentoxifylline (TRENTAL) 400 MG CR tablet TAKE 1 TABLET BY MOUTH EVERY 12 HOURS . (Patient taking differently: Take 400 mg by  mouth every 12 (twelve) hours. TAKE 1 TABLET BY MOUTH EVERY 12 HOURS .) 180 tablet 1   rosuvastatin (CRESTOR) 40 MG tablet Take 1 tablet (40 mg total) by mouth daily. 90 tablet 1   tamsulosin (FLOMAX) 0.4 MG CAPS capsule Take 1 capsule (0.4 mg total) by mouth daily. 90 capsule 1   ONETOUCH ULTRA test strip USE 1 STRIP TO CHECK GLUCOSE ONCE DAILY 50 each 0          Home: Home Living Family/patient expects to be discharged to:: Private residence Living Arrangements: Spouse/significant other Available Help at Discharge: Family, Available PRN/intermittently (son that checks in regularly) Type of Home: House Home Access: Level entry Home Layout: Two level Alternate Level Stairs-Number of Steps: stair lift from basement entrance (level entry) to main level Bathroom Shower/Tub: Health visitor: Handicapped height Home Equipment: Agricultural consultant (2 wheels), Rollator (4 wheels), Wheelchair - manual, Wheelchair - power, Information systems manager, Hand held shower head, Grab bars - tub/shower Additional Comments: wife mod indep with SPC; daughter and son live nearby, check on pt near daily   Functional History: Prior Function Prior Level of Function : Independent/Modified Independent Mobility Comments: typically independent ambulating with LLE prosthetic donned, no DME. reports using w/c first thing in the morning ADLs Comments: independent and manages own meds, participates in IADL   Functional Status:  Mobility: Bed Mobility Overal bed mobility: Needs Assistance Bed Mobility: Supine to Sit Supine to sit: Mod  assist Sit to supine: Mod assist General bed mobility comments: Min A with bed pad used to slide hips towards EOB. Pillow remained under LLE for majority of transition due to pain. Mod assist provided to bring trunk off HOB in order to sit on edge. Transfers Overall transfer level: Needs assistance Equipment used: Rolling walker (2 wheels) Transfers: Sit to/from Stand, Bed to  chair/wheelchair/BSC Sit to Stand: Min guard, From elevated surface Bed to/from chair/wheelchair/BSC transfer type:: Stand pivot Stand pivot transfers: Min assist, Mod assist, From elevated surface Step pivot transfers: Mod assist, +2 physical assistance General transfer comment: Min guard sit to stand with min-mod assist for balance. VC for of off loading left leg and weight bearing into BUEs on RW in order to pivot towards the right. Due to LLE pain, pt was unable to tolerate much weight bearing.   ADL: ADL Overall ADL's : Needs assistance/impaired Eating/Feeding: Set up, Sitting Grooming: Wash/dry hands, Wash/dry face, Set up, Sitting Grooming Details (indicate cue type and reason): EOB Upper Body Bathing: Moderate assistance Upper Body Bathing Details (indicate cue type and reason): for back Lower Body Bathing: Moderate assistance Lower Body Bathing Details (indicate cue type and reason): assist for knees down Upper Body Dressing : Moderate assistance, Sitting Upper Body Dressing Details (indicate cue type and reason): UE needed to support balance at this time Lower Body Dressing: Moderate assistance, Sit to/from stand, Sitting/lateral leans Lower Body Dressing Details (indicate cue type and reason): provided assist with donning right shoe and fastening velcro tight enough. Pt completed majority of left prosthesis donning with very minimal assist from either OT or Son. Provided assist for balance while standing. Toilet Transfer: Moderate assistance, +2 for physical assistance, +2 for safety/equipment, Stand-pivot, BSC/3in1, Rolling walker (2 wheels) Toilet Transfer Details (indicate cue type and reason): assist for boost Toileting- Clothing Manipulation and Hygiene: Maximal assistance Functional mobility during ADLs: Moderate assistance, +2 for physical assistance, +2 for safety/equipment, Cueing for safety, Cueing for sequencing, Rolling walker (2 wheels) General ADL Comments: decreased  access to LB for ADL, pain, decreased knowledge of AE, balance   Cognition: Cognition Overall Cognitive Status: Within Functional Limits for tasks assessed Orientation Level: Oriented X4 Cognition Arousal/Alertness: Awake/alert Behavior During Therapy: WFL for tasks assessed/performed Overall Cognitive Status: Within Functional Limits for tasks assessed General Comments: WFL for simple tasks, not formally assesesd. good sense of humor; sequencing mobility very well with minimal cuing   Physical Exam: Blood pressure (!) 124/55, pulse 82, temperature 98.6 F (37 C), temperature source Oral, resp. rate 18, height 5\' 5"  (1.651 m), weight 77.2 kg, SpO2 92 %. Physical Exam Constitutional:      General: He is not in acute distress.    Comments: Uncomfortable d/t pain  HENT:     Head: Normocephalic.     Right Ear: External ear normal.     Left Ear: External ear normal.     Mouth/Throat:     Mouth: Mucous membranes are moist.  Eyes:     Extraocular Movements: Extraocular movements intact.     Pupils: Pupils are equal, round, and reactive to light.  Cardiovascular:     Rate and Rhythm: Normal rate and regular rhythm.     Heart sounds: No murmur heard.    No gallop.  Pulmonary:     Effort: Pulmonary effort is normal. No respiratory distress.  Abdominal:     General: Bowel sounds are normal. There is no distension.     Palpations: Abdomen is soft. There is no  mass.  Musculoskeletal:     Cervical back: Normal range of motion.     Comments: Mild swelling LLE. Left hip with significant tenderness with movement and palpation.   Skin:    General: Skin is warm and dry.  Neurological:     Mental Status: He is alert.     Comments: Alert and oriented x 3. Normal insight and awareness. Intact Memory. Normal language and speech. Cranial nerve exam unremarkable. MMT: UE 5/5. RLE 4/5. LLE 3/5 HF, KE d/t pain. Sensory exam normal for light touch and pain in all 4 limbs. No limb ataxia or cerebellar  signs. No abnormal tone appreciated.  [name]    Psychiatric:        Mood and Affect: Mood normal.        Behavior: Behavior normal.        Lab Results Last 48 Hours        Results for orders placed or performed during the hospital encounter of 11/30/22 (from the past 48 hour(s))  Glucose, capillary     Status: Abnormal    Collection Time: 12/02/22  4:21 PM  Result Value Ref Range    Glucose-Capillary 114 (H) 70 - 99 mg/dL      Comment: Glucose reference range applies only to samples taken after fasting for at least 8 hours.  Glucose, capillary     Status: Abnormal    Collection Time: 12/02/22  8:51 PM  Result Value Ref Range    Glucose-Capillary 180 (H) 70 - 99 mg/dL      Comment: Glucose reference range applies only to samples taken after fasting for at least 8 hours.  Basic metabolic panel     Status: Abnormal    Collection Time: 12/03/22  3:06 AM  Result Value Ref Range    Sodium 134 (L) 135 - 145 mmol/L    Potassium 4.4 3.5 - 5.1 mmol/L    Chloride 101 98 - 111 mmol/L    CO2 25 22 - 32 mmol/L    Glucose, Bld 93 70 - 99 mg/dL      Comment: Glucose reference range applies only to samples taken after fasting for at least 8 hours.    BUN 27 (H) 8 - 23 mg/dL    Creatinine, Ser 1.61 (H) 0.61 - 1.24 mg/dL    Calcium 8.6 (L) 8.9 - 10.3 mg/dL    GFR, Estimated 51 (L) >60 mL/min      Comment: (NOTE) Calculated using the CKD-EPI Creatinine Equation (2021)      Anion gap 8 5 - 15      Comment: Performed at [facility], [address] [place], [place] [address]  CBC     Status: Abnormal    Collection Time: 12/03/22  3:06 AM  Result Value Ref Range    WBC 11.5 (H) 4.0 - 10.5 K/uL    RBC 3.22 (L) 4.22 - 5.81 MIL/uL    Hemoglobin 9.4 (L) 13.0 - 17.0 g/dL    HCT 54.0 (L) 98.1 - 52.0 %    MCV 92.9 80.0 - 100.0 fL    MCH 29.2 26.0 - 34.0 pg    MCHC 31.4 30.0 - 36.0 g/dL    RDW 19.1 (H) 47.8 - 15.5 %    Platelets 139 (L) 150 - 400 K/uL    nRBC 0.0 0.0 - 0.2 %      Comment:  Performed at [facility], [address], [place], [place] [address]  Glucose, capillary  Status: None    Collection Time: 12/03/22  7:31 AM  Result Value Ref Range    Glucose-Capillary 98 70 - 99 mg/dL      Comment: Glucose reference range applies only to samples taken after fasting for at least 8 hours.  Glucose, capillary     Status: Abnormal    Collection Time: 12/03/22 11:19 AM  Result Value Ref Range    Glucose-Capillary 160 (H) 70 - 99 mg/dL      Comment: Glucose reference range applies only to samples taken after fasting for at least 8 hours.  Glucose, capillary     Status: Abnormal    Collection Time: 12/03/22  4:22 PM  Result Value Ref Range    Glucose-Capillary 101 (H) 70 - 99 mg/dL      Comment: Glucose reference range applies only to samples taken after fasting for at least 8 hours.  Glucose, capillary     Status: Abnormal    Collection Time: 12/03/22  9:45 PM  Result Value Ref Range    Glucose-Capillary 148 (H) 70 - 99 mg/dL      Comment: Glucose reference range applies only to samples taken after fasting for at least 8 hours.  Basic metabolic panel     Status: Abnormal    Collection Time: 12/04/22  4:31 AM  Result Value Ref Range    Sodium 135 135 - 145 mmol/L    Potassium 4.5 3.5 - 5.1 mmol/L    Chloride 104 98 - 111 mmol/L    CO2 23 22 - 32 mmol/L    Glucose, Bld 104 (H) 70 - 99 mg/dL      Comment: Glucose reference range applies only to samples taken after fasting for at least 8 hours.    BUN 30 (H) 8 - 23 mg/dL    Creatinine, Ser 4.09 (H) 0.61 - 1.24 mg/dL    Calcium 8.6 (L) 8.9 - 10.3 mg/dL    GFR, Estimated 49 (L) >60 mL/min      Comment: (NOTE) Calculated using the CKD-EPI Creatinine Equation (2021)      Anion gap 8 5 - 15      Comment: Performed at [facility], [address] [place], [place] [address]  CBC     Status: Abnormal    Collection Time: 12/04/22  4:31 AM  Result Value Ref Range    WBC 12.4 (H) 4.0 - 10.5 K/uL    RBC 3.23  (L) 4.22 - 5.81 MIL/uL    Hemoglobin 9.7 (L) 13.0 - 17.0 g/dL    HCT 47.8 (L) 29.5 - 52.0 %    MCV 91.6 80.0 - 100.0 fL    MCH 30.0 26.0 - 34.0 pg    MCHC 32.8 30.0 - 36.0 g/dL    RDW 62.1 (H) 30.8 - 15.5 %    Platelets 156 150 - 400 K/uL    nRBC 0.0 0.0 - 0.2 %      Comment: Performed at [facility], [address], [place], [place] [address]  Glucose, capillary     Status: None    Collection Time: 12/04/22  7:57 AM  Result Value Ref Range    Glucose-Capillary 94 70 - 99 mg/dL      Comment: Glucose reference range applies only to samples taken after fasting for at least 8 hours.    Comment 1 Notify RN      Comment 2 Document in Chart    Glucose, capillary     Status:  None    Collection Time: 12/04/22 11:18 AM  Result Value Ref Range    Glucose-Capillary 95 70 - 99 mg/dL      Comment: Glucose reference range applies only to samples taken after fasting for at least 8 hours.    Comment 1 Notify RN      Comment 2 Document in Chart      *Note: Due to a large number of results and/or encounters for the requested time period, some results have not been displayed. A complete set of results can be found in Results Review.      Imaging Results (Last 48 hours)  No results found.         Blood pressure (!) 124/55, pulse 82, temperature 98.6 F (37 C), temperature source Oral, resp. rate 18, height 5\' 5"  (1.651 m), weight 77.2 kg, SpO2 92 %.   Medical Problem List and Plan: 1. Functional deficits secondary to left greater trochanter fracture             -pt with prior left BKA and prosthesis- incorporate donning/doffing, use of prosthetic into therapy plan/goals. Socket a little tighter than usual due to sl swelling in LLE from fracture and not wearing prosthesis as much.             -patient may  shower             -ELOS/Goals: 10 days, mod I to supervision with PT, OT     2.  Antithrombotics: -DVT/anticoagulation:  Pharmaceutical: Eliquis             -antiplatelet therapy:     3. Pain Management: Tylenol as needed for mild pain             -hydrocodone prn for severe pain             -robaxin prn for spasms             -prn ice   4. Mood/Behavior/Sleep: LCSW to evaluate and provide emotional support             -antipsychotic agents: n/a   5. Neuropsych/cognition: This patient is capable of making decisions on his own behalf.   6. Skin/Wound Care: Routine skin care checks   7. Fluids/Electrolytes/Nutrition: Routine Is and Os and follow-up chemistries             -continue vitamin B12 supplements, oral irom   8: Hypertension: monitor TID and prn (lasix 40 mg not restarted)             -continue amlodipine 10 mg daily             -continue atenolol 25 mg daily             -continue hydralazine 75 mg BID   9: Hyperlipidemia: continue statin   10: Atrial fibrillation, chronic: rate controlled; continue Eliquis, BB   11: CAD: stable; s/p CABG 2000; balloon angioplasty 2003             -follows with Dr. Tenny Craw   12: OSA:    13: Anemia: continue oral iron; follow-up CBC   14: Left greater trochanter displacement; non-opertive management             -WBAT   15: Urinary retention/BPH: continue Proscar and Flomax             -monitor output/PVR's             -OOB to void when possible 16: DM-2: CBGs  QID; carb modified diet; A1c 4.8% on 4/08 (diet controlled at home)             -on SSI but not requiring coverage   17: AKI atop CKD 3: baseline creatinine ~1.3 ??             -follow-up BMP   18: PAD: s/p left BKA 2019 Dr. Wyn Quaker             -continue Trental   19: Leukocytosis: afebrile; on Zosyn for UTI, started today 4/26             -follow-up sensitives -follow-up CBC   21: Pseudomonas (and E Coli) UTI             -continue Zosyn iv x 3 days at least then po cipro x 3 more days per [facility]         Milinda Antis, PA-C 12/04/2022  I have personally performed a face to face diagnostic evaluation of this patient and formulated the key components  of the plan.  Additionally, I have personally reviewed laboratory data, imaging studies, as well as relevant notes and concur with the physician assistant's documentation above.  The patient's status has not changed from the original H&P.  Any changes in documentation from the acute care chart have been noted above.  Ranelle Oyster, MD, [place]

## 2022-12-05 DIAGNOSIS — S72109A Unspecified trochanteric fracture of unspecified femur, initial encounter for closed fracture: Secondary | ICD-10-CM | POA: Diagnosis not present

## 2022-12-05 DIAGNOSIS — S72102D Unspecified trochanteric fracture of left femur, subsequent encounter for closed fracture with routine healing: Secondary | ICD-10-CM

## 2022-12-05 LAB — COMPREHENSIVE METABOLIC PANEL
ALT: 19 U/L (ref 0–44)
AST: 26 U/L (ref 15–41)
Albumin: 2.2 g/dL — ABNORMAL LOW (ref 3.5–5.0)
Alkaline Phosphatase: 49 U/L (ref 38–126)
Anion gap: 8 (ref 5–15)
BUN: 28 mg/dL — ABNORMAL HIGH (ref 8–23)
CO2: 23 mmol/L (ref 22–32)
Calcium: 8.2 mg/dL — ABNORMAL LOW (ref 8.9–10.3)
Chloride: 102 mmol/L (ref 98–111)
Creatinine, Ser: 1.34 mg/dL — ABNORMAL HIGH (ref 0.61–1.24)
GFR, Estimated: 50 mL/min — ABNORMAL LOW (ref >60)
Glucose, Bld: 96 mg/dL (ref 70–99)
Potassium: 4.4 mmol/L (ref 3.5–5.1)
Sodium: 133 mmol/L — ABNORMAL LOW (ref 135–145)
Total Bilirubin: 0.9 mg/dL (ref 0.3–1.2)
Total Protein: 5.4 g/dL — ABNORMAL LOW (ref 6.5–8.1)

## 2022-12-05 LAB — GLUCOSE, CAPILLARY
Glucose-Capillary: 98 mg/dL (ref 70–99)
Glucose-Capillary: 126 mg/dL — ABNORMAL HIGH (ref 70–99)
Glucose-Capillary: 108 mg/dL — ABNORMAL HIGH (ref 70–99)
Glucose-Capillary: 124 mg/dL — ABNORMAL HIGH (ref 70–99)

## 2022-12-05 MED ORDER — SORBITOL 70 % SOLN
30.0000 mL | Freq: Every day | Status: DC | PRN
  Administered 2022-12-06 – 2022-12-10 (×3): 30 mL via ORAL
  Filled 2022-12-05 (×3): qty 30

## 2022-12-05 MED ORDER — ONDANSETRON HCL 4 MG/2ML IJ SOLN: 4.0000 mg | Freq: Four times a day (QID) | INTRAMUSCULAR | Status: DC | PRN

## 2022-12-05 MED ORDER — ONDANSETRON HCL 4 MG PO TABS
4.0000 mg | ORAL_TABLET | Freq: Four times a day (QID) | ORAL | Status: DC | PRN
  Administered 2022-12-06: 4 mg via ORAL
  Filled 2022-12-05: qty 1

## 2022-12-05 MED ORDER — GUAIFENESIN-DM 100-10 MG/5ML PO SYRP: 5.0000 mL | ORAL_SOLUTION | Freq: Four times a day (QID) | ORAL | Status: DC | PRN

## 2022-12-05 MED ORDER — ALUM & MAG HYDROXIDE-SIMETH 200-200-20 MG/5ML PO SUSP: 30.0000 mL | ORAL | Status: DC | PRN

## 2022-12-05 NOTE — Plan of Care (Signed)
  Problem: RH Balance Goal: LTG Patient will maintain dynamic standing with ADLs (OT) Description: LTG:  Patient will maintain dynamic standing balance with assist during activities of daily living (OT)  Flowsheets (Taken 12/05/2022 1545) LTG: Pt will maintain dynamic standing balance during ADLs with: Supervision/Verbal cueing   Problem: Sit to Stand Goal: LTG:  Patient will perform sit to stand in prep for activites of daily living with assistance level (OT) Description: LTG:  Patient will perform sit to stand in prep for activites of daily living with assistance level (OT) Flowsheets (Taken 12/05/2022 1545) LTG: PT will perform sit to stand in prep for activites of daily living with assistance level: Supervision/Verbal cueing   Problem: RH Bathing Goal: LTG Patient will bathe all body parts with assist levels (OT) Description: LTG: Patient will bathe all body parts with assist levels (OT) Flowsheets (Taken 12/05/2022 1545) LTG: Pt will perform bathing with assistance level/cueing: Supervision/Verbal cueing   Problem: RH Dressing Goal: LTG Patient will perform lower body dressing w/assist (OT) Description: LTG: Patient will perform lower body dressing with assist, with/without cues in positioning using equipment (OT) Flowsheets (Taken 12/05/2022 1545) LTG: Pt will perform lower body dressing with assistance level of: Supervision/Verbal cueing   Problem: RH Toileting Goal: LTG Patient will perform toileting task (3/3 steps) with assistance level (OT) Description: LTG: Patient will perform toileting task (3/3 steps) with assistance level (OT)  Flowsheets (Taken 12/05/2022 1545) LTG: Pt will perform toileting task (3/3 steps) with assistance level: Supervision/Verbal cueing   Problem: RH Toilet Transfers Goal: LTG Patient will perform toilet transfers w/assist (OT) Description: LTG: Patient will perform toilet transfers with assist, with/without cues using equipment (OT) Flowsheets (Taken  12/05/2022 1545) LTG: Pt will perform toilet transfers with assistance level of: Supervision/Verbal cueing   Problem: RH Tub/Shower Transfers Goal: LTG Patient will perform tub/shower transfers w/assist (OT) Description: LTG: Patient will perform tub/shower transfers with assist, with/without cues using equipment (OT) Flowsheets (Taken 12/05/2022 1545) LTG: Pt will perform tub/shower stall transfers with assistance level of: Supervision/Verbal cueing

## 2022-12-05 NOTE — Progress Notes (Signed)
Physical Therapy Session Note  Patient Details  Name: Eric [name]. MRN: [id number] Date of Birth: 1928/06/18  Today's Date: 12/05/2022 PT Individual Time: 4098-1191 PT Individual Time Calculation (min): 63 min   Short Term Goals: Week 1:  PT Short Term Goal 1 (Week 1): Pt will perform supine<>sit with no more than CGA PT Short Term Goal 2 (Week 1): Pt will perform sit<>stands using LRAD with no more than CGA PT Short Term Goal 3 (Week 1): Pt will perform bed<>chair transfers using LRAD with CGA PT Short Term Goal 4 (Week 1): Pt will ambulate at least 74ft using LRAD with CGA  Skilled Therapeutic Interventions/Progress Updates:    Pt received sitting in w/c with his son, [name], present and pt agreeable to therapy session. B UE w/c propulsion ~166ft to main therapy gym with supervision - pt occasionally getting close to bumping into items on R side but is able to correct without cuing nor assistance.   Sit>stand w/c>RW requiring light mod assist for lifting to stand this afternoon compared to this morning with pt having more difficulty powering up into standing - improved to min assist throughout session.  Gait training 88ft+ 73ft using RW with min assist for balance and AD management and +2 bringing w/c follow in event of fatigue and/or increased pain (but pt able to turn and sit on mat after 2nd walk). Pt demonstrating the following gait deviations with therapist providing the described cuing and facilitation for improvement: - antalgic gait pattern with very limited WBing through L LE, pt compensating with B UE support through AD - very small, shuffled steps forward throughout with excessive forward trunk flexed posture - keeps L knee partially flexed throughout with decreased quad activation during stance (would benefit from standing TKE training with weight shifting to promote increased WBing tolerance)   Seated L LE long arc quads through decreased knee extension ROM (lacking  ~20-30degrees of extension due to pt reporting increased pain when attempting to straighten more) - x15reps. Seated L LE knee flexion AROM against slight manual resistance x12reps with no pain.  Seated R LE ankle DF and PF against level 3 green theraband resistance x12 reps each (lessened the resistance for DF due to weakness).  L stand pivot EOM>w/c using RW with heavy min assist for coming to stand and then light min assist for balance while turning. Transported back to room and pt requesting to return to bed due to fatigue and increased pain.   R stand pivot w/c>EOB using RW with min assist. Sit>supine via reverse logroll with pillow between pt's knees to avoid active L hip abduction movement due to precautions - min assist for LE management onto bed. Therapist reinforced education on importance of not keeping L LE elevated into hip flexion to avoid risk of contracture. Pt left supine in bed with needs in reach, bed alarm on, and family present.    Therapy Documentation Precautions:  Precautions Precautions: Fall, Other (comment) Precaution Comments: h/o L BKA (has prosthetic), L LE WBAT, no active hip abduction Restrictions Weight Bearing Restrictions: Yes LLE Weight Bearing: Weight bearing as tolerated Other Position/Activity Restrictions: no active hip abduction   Pain:  Reports L hip pain rated as 6/10 that stays at that level throughout, with activity, pt had been premedicated - provided seated rest breaks, distractions, and change of scenery for pain management.   Therapy/Group: Individual Therapy  Ginny Forth , PT, DPT, NCS, CSRS 12/05/2022, 6:03 PM

## 2022-12-05 NOTE — Plan of Care (Signed)
  Problem: RH Balance Goal: LTG Patient will maintain dynamic sitting balance (PT) Description: LTG:  Patient will maintain dynamic sitting balance with assistance during mobility activities (PT) Flowsheets (Taken 12/05/2022 1851) LTG: Pt will maintain dynamic sitting balance during mobility activities with:: Independent with assistive device  Goal: LTG Patient will maintain dynamic standing balance (PT) Description: LTG:  Patient will maintain dynamic standing balance with assistance during mobility activities (PT) Flowsheets (Taken 12/05/2022 1851) LTG: Pt will maintain dynamic standing balance during mobility activities with:: Contact Guard/Touching assist   Problem: Sit to Stand Goal: LTG:  Patient will perform sit to stand with assistance level (PT) Description: LTG:  Patient will perform sit to stand with assistance level (PT) Flowsheets (Taken 12/05/2022 1851) LTG: PT will perform sit to stand in preparation for functional mobility with assistance level: Supervision/Verbal cueing   Problem: RH Bed Mobility Goal: LTG Patient will perform bed mobility with assist (PT) Description: LTG: Patient will perform bed mobility with assistance, with/without cues (PT). Flowsheets (Taken 12/05/2022 1851) LTG: Pt will perform bed mobility with assistance level of: Supervision/Verbal cueing   Problem: RH Bed to Chair Transfers Goal: LTG Patient will perform bed/chair transfers w/assist (PT) Description: LTG: Patient will perform bed to chair transfers with assistance (PT). Flowsheets (Taken 12/05/2022 1851) LTG: Pt will perform Bed to Chair Transfers with assistance level: Supervision/Verbal cueing   Problem: RH Car Transfers Goal: LTG Patient will perform car transfers with assist (PT) Description: LTG: Patient will perform car transfers with assistance (PT). Flowsheets (Taken 12/05/2022 1851) LTG: Pt will perform car transfers with assist:: Contact Guard/Touching assist   Problem: RH  Ambulation Goal: LTG Patient will ambulate in controlled environment (PT) Description: LTG: Patient will ambulate in a controlled environment, # of feet with assistance (PT). Flowsheets (Taken 12/05/2022 1851) LTG: Pt will ambulate in controlled environ  assist needed:: Supervision/Verbal cueing LTG: Ambulation distance in controlled environment: 96ft using LRAD Goal: LTG Patient will ambulate in home environment (PT) Description: LTG: Patient will ambulate in home environment, # of feet with assistance (PT). Flowsheets (Taken 12/05/2022 1851) LTG: Pt will ambulate in home environ  assist needed:: Supervision/Verbal cueing LTG: Ambulation distance in home environment: 51ft using LRAD   Problem: RH Wheelchair Mobility Goal: LTG Patient will propel w/c in controlled environment (PT) Description: LTG: Patient will propel wheelchair in controlled environment, # of feet with assist (PT) Flowsheets (Taken 12/05/2022 1851) LTG: Pt will propel w/c in controlled environ  assist needed:: Independent with assistive device LTG: Propel w/c distance in controlled environment: 157ft Goal: LTG Patient will propel w/c in home environment (PT) Description: LTG: Patient will propel wheelchair in home environment, # of feet with assistance (PT). Flowsheets (Taken 12/05/2022 1851) LTG: Pt will propel w/c in home environ  assist needed:: Independent with assistive device LTG: Propel w/c distance in home environment: 58ft

## 2022-12-05 NOTE — Discharge Instructions (Addendum)
Inpatient Rehab Discharge Instructions  Eric [name]. Discharge date and time: 12/18/2022  Activities/Precautions/ Functional Status: Activity: no lifting, driving, or strenuous exercise until cleared by MD; weight bearing as tolerated Diet: diabetic diet Wound Care: none  Functional status:  ___ No restrictions     ___ Walk up steps independently ___ 24/7 supervision/assistance   ___ Walk up steps with assistance __x_ Intermittent supervision/assistance  ___ Bathe/dress independently ___ Walk with walker     ___ Bathe/dress with assistance ___ Walk Independently    ___ Shower independently ___ Walk with assistance    __x_ Shower with assistance _x__ No alcohol     ___ Return to work/school ________  Special Instructions: No driving, alcohol consumption or tobacco use.  COMMUNITY REFERRALS UPON DISCHARGE:    Home Health:   PT   OT   RN                Agency:[facility] Phone:[phone]    Medical Equipment/Items Ordered: HAS ALL NEEDED EQUIPMENT FROM PAST ADMISSIONS                                                 Agency/Supplier; NA  [facility]-REFERRAL FOR [facility] IF NEEDED-WILL SHIP TO HOME    My questions have been answered and I understand these instructions. I will adhere to these goals and the provided educational materials after my discharge from the hospital.  Patient/Caregiver Signature _______________________________ Date __________  Clinician Signature _______________________________________ Date __________  Please bring this form and your medication list with you to all your follow-up doctor's appointments.    Information on my medicine - ELIQUIS (apixaban)  This medication education was reviewed with me or my healthcare representative as part of my discharge preparation.  The pharmacist that spoke with me during my hospital stay was:    Why was Eliquis prescribed for you? Eliquis was prescribed for you to reduce the risk of a blood  clot forming that can cause a stroke if you have a medical condition called atrial fibrillation (a type of irregular heartbeat).  What do You need to know about Eliquis ? Take your Eliquis TWICE DAILY - one tablet in the morning and one tablet in the evening with or without food. If you have difficulty swallowing the tablet whole please discuss with your pharmacist how to take the medication safely.  Take Eliquis exactly as prescribed by your doctor and DO NOT stop taking Eliquis without talking to the doctor who prescribed the medication.  Stopping may increase your risk of developing a stroke.  Refill your prescription before you run out.  After discharge, you should have regular check-up appointments with your healthcare provider that is prescribing your Eliquis.  In the future your dose may need to be changed if your kidney function or weight changes by a significant amount or as you get older.  What do you do if you miss a dose? If you miss a dose, take it as soon as you remember on the same day and resume taking twice daily.  Do not take more than one dose of ELIQUIS at the same time to make up a missed dose.  Important Safety Information A possible side effect of Eliquis is bleeding. You should call your healthcare provider right away if you experience any of the following: Bleeding from  an injury or your nose that does not stop. Unusual colored urine (red or dark brown) or unusual colored stools (red or black). Unusual bruising for unknown reasons. A serious fall or if you hit your head (even if there is no bleeding).  Some medicines may interact with Eliquis and might increase your risk of bleeding or clotting while on Eliquis. To help avoid this, consult your healthcare provider or pharmacist prior to using any new prescription or non-prescription medications, including herbals, vitamins, non-steroidal anti-inflammatory drugs (NSAIDs) and supplements.  This website has more  information on Eliquis (apixaban): http://[email or web address].[email or web address]/home

## 2022-12-05 NOTE — Evaluation (Signed)
Occupational Therapy Assessment and Plan  Patient Details  Name: Eric [name]. MRN: [id number] Date of Birth: Sep 21, 1927  OT Diagnosis: acute pain and muscle weakness (generalized) Rehab Potential: Rehab Potential (ACUTE ONLY): Good ELOS: 10-12 days   Today's Date: 12/05/2022 OT Individual Time: 1305-1405 OT Individual Time Calculation (min): 60 min     Hospital Problem: Principal Problem:   Greater trochanter fracture (HCC) Active Problems:   Traumatic closed trochanteric fracture of femur with minimal displacement with routine healing, left   Past Medical History:  Past Medical History:  Diagnosis Date   Atrial flutter (HCC)    s/p RFCA   CAD (coronary artery disease)    cath 2003, occluded S-RCA, occluded S-Dx, L-LAD ok, s/p PTCA to LAD   Cataract    CKD (chronic kidney disease) stage 3, GFR 30-59 ml/min (HCC)    Diabetes mellitus    diet controlled   Hearing aid worn    bilateral   History of kidney stones    Long term (current) use of anticoagulants    Neuromuscular disorder (HCC)    OSA (obstructive sleep apnea) 12/11   very mild, AHI 7/hr   Persistent atrial fibrillation (HCC) 09/26/2015   Pure hyperglyceridemia    PVD (peripheral vascular disease) (HCC)    angioplasty of his right lower extremity in [place] by Dr.[name] 2013   Unspecified essential hypertension    Wears dentures    full upper and lower   Past Surgical History:  Past Surgical History:  Procedure Laterality Date   ABDOMINAL AORTOGRAM W/LOWER EXTREMITY N/A 11/15/2017   Procedure: ABDOMINAL AORTOGRAM W/LOWER EXTREMITY;  Surgeon: Sherren Kerns, MD;  Location: [facility];  Service: Cardiovascular;  Laterality: N/A;   Adenosine Myoview  3/06   EF 56%, neg. Ischemia   Adenosine Myoview  02/18/07   nml   AMPUTATION Left 12/15/2017   Procedure: AMPUTATION BELOW KNEE;  Surgeon: Annice Needy, MD;  Location: [facility] OR[facility];  Service: Vascular;  Laterality: Left;   ANGIOPLASTY  1/99   CAD-  diogonal with rotational artherectomy   Arthrectomy     of LAD & PTCA   BLEPHAROPLASTY Bilateral    CARDIAC CATHETERIZATION  1/00   CARDIOVERSION  1/04   CARDIOVERSION  5/07   hospital- a flutter   CARPAL TUNNEL RELEASE     ? bilateral   CATARACT EXTRACTION W/PHACO Right 07/28/2017   Procedure: CATARACT EXTRACTION PHACO AND INTRAOCULAR LENS PLACEMENT (IOC) RIGHT DIABETIC;  Surgeon: Lockie Mola, MD;  Location: [facility];  Service: Ophthalmology;  Laterality: Right;  Diabetic - diet controlled   CATARACT EXTRACTION W/PHACO Left 08/18/2017   Procedure: CATARACT EXTRACTION PHACO AND INTRAOCULAR LENS PLACEMENT (IOC);  Surgeon: Lockie Mola, MD;  Location: [facility];  Service: Ophthalmology;  Laterality: Left;  DIABETES - oral meds   COLONOSCOPY W/ BIOPSIES  10/01/06   sigmoid polyp bx neg, 3 years   COLONOSCOPY WITH PROPOFOL N/A 04/28/2021   Procedure: COLONOSCOPY WITH PROPOFOL;  Surgeon: Iva Boop, MD;  Location: [facility] ENDOSCOPY;  Service: Endoscopy;  Laterality: N/A;   CORONARY ANGIOPLASTY  4/03   cutting balloon PTCA pLAD into Diag   CORONARY ARTERY BYPASS GRAFT  2000   LIMA-LAD, SVG-RCA, SVG-Diag; SVG-Diag & SVG-RCA occluded 2003   FRACTURE SURGERY     HAND SURGERY  08/27/09   R thumb procedure wit Scaphoid Gragt and screws, Dr Merlyn Lot   LOWER EXTREMITY ANGIOGRAPHY Right 01/25/2019   Procedure: LOWER EXTREMITY ANGIOGRAPHY;  Surgeon: Gilda Crease,  Latina Craver, MD;  Location: [facility];  Service: Cardiovascular;  Laterality: Right;   NM MYOVIEW LTD  4/11   normal   PERIPHERAL VASCULAR CATHETERIZATION Right 06/27/2015   Procedure: Lower Extremity Angiography;  Surgeon: Annice Needy, MD;  Location: [facility];  Service: Cardiovascular;  Laterality: Right;   PERIPHERAL VASCULAR CATHETERIZATION  06/27/2015   Procedure: Lower Extremity Intervention;  Surgeon: Annice Needy, MD;  Location: [facility];  Service: Cardiovascular;;    POLYPECTOMY  04/28/2021   Procedure: POLYPECTOMY;  Surgeon: Iva Boop, MD;  Location: [facility] ENDOSCOPY;  Service: Endoscopy;;    Assessment & Plan Clinical Impression: .Eric [name] is a 89 year old male presented to the ED on 11/30/2022 complaining of left hip pain. He evidently got up from a chair, felt left hip pain and lowered himself to the floor. No other injury or LOC. He is s/p left BKA in 2019 and does use a prosthesis.  X-ray of patient's left hip shows positive fracture at the left greater trochanter.  Discussed with Dr. Victorino Dike from orthopedics and he saw the patient consultation.  Admitted to the medicine service. He is on Eliquis for atrial fibrillation. This was initially held for possible surgical intervention. Oral anti-hypotensives continued. Found to have AKI atop CKD stage III. Improved with hydration. PMH significant for chronic anemia, CAD, OSA, PVD, hyperlipidemia. DM-2 diet controlled.  UA positive for GNR and started on Zosyn today. Plan is to convert to Cipro. Follow-up culture results.  Able to stand from recliner with min assist, very little WB through LLE while wearing prosthesis. Min assist with small pivotal steps to bed, requires extra time but gradually putting more pressure through LLE. Good control with descent. The patient requires inpatient medicine and rehabilitation evaluations and services for ongoing dysfunction secondary to left greater trochanter fracture; prior left BKA.   Patient transferred to CIR on 12/04/2022 .    Patient currently requires max with LB basic self-care skills secondary to muscle weakness, muscle joint tightness, and acute pain and decreased standing balance, decreased balance strategies, and difficulty with weight bearing through LLE .  Prior to hospitalization, patient could complete ADLs with modified independent .  Patient will benefit from skilled intervention to increase independence with basic self-care skills prior to discharge home  with care partner.  Anticipate patient will require intermittent supervision and follow up home health.  OT - End of Session Activity Tolerance: Tolerates 10 - 20 min activity with multiple rests Endurance Deficit: Yes Endurance Deficit Description: pt felt slightly dizzy atter sitting up, BP 147/ 77 OT Assessment Rehab Potential (ACUTE ONLY): Good OT Patient demonstrates impairments in the following area(s): Balance;Endurance;Motor;Pain OT Basic ADL's Functional Problem(s): Grooming;Bathing;Dressing;Toileting OT Transfers Functional Problem(s): Toilet;Tub/Shower OT Additional Impairment(s): None OT Plan OT Intensity: Minimum of 1-2 x/day, 45 to 90 minutes OT Frequency: 5 out of 7 days OT Duration/Estimated Length of Stay: 10-12 days OT Treatment/Interventions: Social worker;Discharge planning;Pain management;Patient/family education;Psychosocial support;Therapeutic Exercise;Self Care/advanced ADL retraining;Therapeutic Activities;UE/LE Strength taining/ROM;Functional mobility training OT Self Feeding Anticipated Outcome(s): no goal, pt is independent OT Basic Self-Care Anticipated Outcome(s): supervision OT Toileting Anticipated Outcome(s): supervision OT Bathroom Transfers Anticipated Outcome(s): supervision OT Recommendation Patient destination: Home Follow Up Recommendations: Home health OT   OT Evaluation Precautions/Restrictions  Precautions Precautions: Fall;Other (comment) Precaution Comments: h/o L BKA (has prosthetic), L LE WBAT, no active hip abduction Restrictions LLE Weight Bearing: Weight bearing as tolerated Other Position/Activity Restrictions: per secure chat  with ortho PA Charma Igo) 12/02/22 8A - confirmed LLE WBAT, no active hip abduction   Vital Signs Therapy Vitals Temp: 98 F (36.7 C) Pulse Rate: (Abnormal) 54 Resp: 16 BP: (Abnormal) 143/55 Patient Position (if appropriate): Lying Oxygen  Therapy SpO2: 98 % O2 Device: Room Air Pain Pain Assessment Pain Scale: 0-10 Pain Score: 4  Pain Type: Acute pain Pain Location: Hip Pain Orientation: Left Pain Descriptors / Indicators: Stabbing Pain Frequency: Intermittent Pain Onset: Progressive Pain Intervention(s): Medication (See eMAR) Home Living/Prior Functioning Home Living Available Help at Discharge: Family, Available 24 hours/day Type of Home: House Home Access: Ramped entrance Home Layout: Two level Alternate Level Stairs-Number of Steps: stair lift from basement entrance (level entry) - this is how pt usually enters/exits the home Bathroom Shower/Tub: Health visitor: Handicapped height Additional Comments: wife mod indep with SPC in house or rollator in community; pt wears prosthetic and mod-I with cane in house or uses wheelchair when takes prosthetic off;  has rollator he uses to walk to mailbox or to go out of the house  Lives With: Spouse Prior Function Level of Independence: Independent with gait, Independent with transfers, Independent with basic ADLs  Able to Take Stairs?: No Driving: Yes Vision Baseline Vision/History: 1 Wears glasses Ability to See in Adequate Light: 0 Adequate Patient Visual Report: No change from baseline Vision Assessment?: No apparent visual deficits Perception  Perception: Within Functional Limits Praxis Praxis: Intact Cognition Cognition Overall Cognitive Status: Within Functional Limits for tasks assessed Arousal/Alertness: Awake/alert Orientation Level: Person;Place;Situation Person: Oriented Place: Oriented Situation: Oriented Memory: Appears intact Attention: Focused;Sustained Focused Attention: Appears intact Sustained Attention: Appears intact Awareness: Appears intact Problem Solving: Appears intact Safety/Judgment: Appears intact Brief Interview for Mental Status (BIMS) Repetition of Three Words (First Attempt): 3 Temporal Orientation: Year:  Correct Temporal Orientation: Month: Accurate within 5 days Temporal Orientation: Day: Correct Recall: "Sock": Yes, no cue required Recall: "Blue": Yes, no cue required Recall: "Bed": Yes, no cue required BIMS Summary Score: 15 Sensation Sensation Light Touch Impaired Details: Impaired RLE Hot/Cold: Appears Intact Proprioception Impaired Details: Impaired RLE Stereognosis: Not tested Coordination Gross Motor Movements are Fluid and Coordinated: No Fine Motor Movements are Fluid and Coordinated: Yes Motor  Motor Motor: Other (comment) Motor - Skilled Clinical Observations: limited LLE movement due to hip pain  Trunk/Postural Assessment  Postural Control Postural Control: Within Functional Limits  Balance Static Sitting Balance Static Sitting - Level of Assistance: 5: Stand by assistance Dynamic Sitting Balance Dynamic Sitting - Level of Assistance: 4: Min assist Sitting balance - Comments: dificulty with forward reaching due to hip pain Static Standing Balance Static Standing - Level of Assistance: 4: Min assist Dynamic Standing Balance Dynamic Standing - Level of Assistance: 3: Mod assist Extremity/Trunk Assessment RUE Assessment RUE Assessment: Within Functional Limits LUE Assessment LUE Assessment: Within Functional Limits  Care Tool Care Tool Self Care Eating   Eating Assist Level: Independent    Oral Care    Oral Care Assist Level: Set up assist    Bathing   Body parts bathed by patient: Right arm;Left arm;Chest;Abdomen;Front perineal area;Right upper leg;Left upper leg Body parts bathed by helper: Left lower leg;Right lower leg;Buttocks   Assist Level: Moderate Assistance - Patient 50 - 74%    Upper Body Dressing(including orthotics)   What is the patient wearing?: Pull over shirt   Assist Level: Set up assist    Lower Body Dressing (excluding footwear)   What is the patient wearing?: Incontinence brief;Pants Assist  for lower body dressing: Maximal  Assistance - Patient 25 - 49%    Putting on/Taking off footwear   What is the patient wearing?: Orthosis;Socks;Shoes Assist for footwear: Maximal Assistance - Patient 25 - 49%       Care Tool Toileting Toileting activity   Assist for toileting: Maximal Assistance - Patient 25 - 49%     Care Tool Bed Mobility Roll left and right activity   Roll left and right assist level: Contact Guard/Touching assist    Sit to lying activity        Lying to sitting on side of bed activity   Lying to sitting on side of bed assist level: the ability to move from lying on the back to sitting on the side of the bed with no back support.: Moderate Assistance - Patient 50 - 74%     Care Tool Transfers Sit to stand transfer   Sit to stand assist level: Minimal Assistance - Patient > 75%    Chair/bed transfer   Chair/bed transfer assist level: Minimal Assistance - Patient > 75%     Toilet transfer   Assist Level: Minimal Assistance - Patient > 75%     Care Tool Cognition  Expression of Ideas and Wants Expression of Ideas and Wants: 4. Without difficulty (complex and basic) - expresses complex messages without difficulty and with speech that is clear and easy to understand  Understanding Verbal and Non-Verbal Content Understanding Verbal and Non-Verbal Content: 4. Understands (complex and basic) - clear comprehension without cues or repetitions   Memory/Recall Ability Memory/Recall Ability : Current season;That he or she is in a hospital/hospital unit   Refer to Care Plan for Long Term Goals  SHORT TERM GOAL WEEK 1 OT Short Term Goal 1 (Week 1): Pt will be able to ambulate to bathroom with RW with CGA. OT Short Term Goal 2 (Week 1): Pt will complete stand pivot transfer wc to toilet with Supervision with RW. OT Short Term Goal 3 (Week 1): Pt will be able to don pants over feet with CGA. OT Short Term Goal 4 (Week 1): Pt will be able to pull pants over hips with CGA.  Recommendations for other  services: None    Skilled Therapeutic Intervention ADL ADL Eating: Independent Grooming: Setup Upper Body Bathing: Setup Lower Body Bathing: Moderate assistance Upper Body Dressing: Setup Lower Body Dressing: Maximal assistance Toileting: Maximal assistance Toilet Transfer: Minimal assistance Toilet Transfer Method: Stand pivot Toilet Transfer Equipment: Raised toilet seat     Pt received in bed ready for therapy.  Focus of therapy session on  initial evaluation.  Pt's son present. Reviewed role of OT, discussed POC, ELOS, pt's goals.  Discussed OT intervention of Balance/vestibular training;DME/adaptive equipment instruction;Discharge planning;Pain management;Patient/family education;Psychosocial support;Therapeutic Exercise;Self Care/advanced ADL retraining;Therapeutic Activities;UE/LE Strength taining/ROM;Functional mobility training.  Pt engaged in bed mobility (mod A due to hip pain), sit to stands with RW min A,  stand pivot transfers with RW min A.  Due to hip pain , he needed increased A with managing clothing over feet and hips.  Pt completed sponge bathe EOB, dressing and toileting.  Tried to initiate squat pivot transfers as an option to stand pivot with RW but pt did not feel comfortable with method.  He said at home he does a stand pivot on RLE even with prosthesis off.    Pt resting in w/c with RN in room with pt.     Discharge Criteria: Patient will be discharged from OT  if patient refuses treatment 3 consecutive times without medical reason, if treatment goals not met, if there is a change in medical status, if patient makes no progress towards goals or if patient is discharged from hospital.  The above assessment, treatment plan, treatment alternatives and goals were discussed and mutually agreed upon: by patient and by family  Gabriela Giannelli 12/05/2022, 3:36 PM

## 2022-12-05 NOTE — Progress Notes (Signed)
PROGRESS NOTE   Subjective/Complaints:  Pt reports SCDs's are tight and wants them removed- esp near knee, it's tight.  Small BM this AM- feels like could have another one "any time".   ROS:  Pt denies SOB, abd pain, CP, N/V/C/D, and vision changes Except for HPI  Objective:   No results found. Recent Labs    12/03/22 0306 12/04/22 0431  WBC 11.5* 12.4*  HGB 9.4* 9.7*  HCT 29.9* 29.6*  PLT 139* 156   Recent Labs    12/04/22 0431 12/05/22 0647  NA 135 133*  K 4.5 4.4  CL 104 102  CO2 23 23  GLUCOSE 104* 96  BUN 30* 28*  CREATININE 1.36* 1.34*  CALCIUM 8.6* 8.2*    Intake/Output Summary (Last 24 hours) at 12/05/2022 1213 Last data filed at 12/05/2022 9147 Gross per 24 hour  Intake 120 ml  Output 350 ml  Net -230 ml        Physical Exam: Vital Signs Blood pressure (!) 161/71, pulse 71, temperature 98.4 F (36.9 C), temperature source Oral, resp. rate 18, height 5\' 5"  (1.651 m), weight 76.6 kg, SpO2 95 %.    General: awake, alert, appropriate, sitting up in bed; ate 75% of tray; nurse in room; NAD HENT: conjugate gaze; oropharynx moist CV: regular rate; no JVD Pulmonary: CTA B/L; no W/R/R- good air movement GI: soft, NT, ND, (+)BS- slightly hyperactive Psychiatric: appropriate Neurological: Ox3 Musculoskeletal:     Cervical back: Normal range of motion.     Comments: Mild swelling LLE. Left hip with significant tenderness with movement and palpation.  No improvement so far Skin:    General: Skin is warm and dry.  Neurological:     Mental Status: He is alert.     Comments: Alert and oriented x 3. Normal insight and awareness. Intact Memory. Normal language and speech. Cranial nerve exam unremarkable. MMT: UE 5/5. RLE 4/5. LLE 3/5 HF, KE d/t pain. Sensory exam normal for light touch and pain in all 4 limbs. No limb ataxia or cerebellar signs. No abnormal tone appreciated.  .      Assessment/Plan: 1. Functional deficits which require 3+ hours per day of interdisciplinary therapy in a comprehensive inpatient rehab setting. Physiatrist is providing close team supervision and 24 hour management of active medical problems listed below. Physiatrist and rehab team continue to assess barriers to discharge/monitor patient progress toward functional and medical goals  Care Tool:  Bathing              Bathing assist       Upper Body Dressing/Undressing Upper body dressing        Upper body assist      Lower Body Dressing/Undressing Lower body dressing            Lower body assist       Toileting Toileting    Toileting assist       Transfers Chair/bed transfer  Transfers assist           Locomotion Ambulation   Ambulation assist              Walk 10 feet activity   Assist  Walk 50 feet activity   Assist           Walk 150 feet activity   Assist           Walk 10 feet on uneven surface  activity   Assist           Wheelchair     Assist               Wheelchair 50 feet with 2 turns activity    Assist            Wheelchair 150 feet activity     Assist          Blood pressure (!) 161/71, pulse 71, temperature 98.4 F (36.9 C), temperature source Oral, resp. rate 18, height 5\' 5"  (1.651 m), weight 76.6 kg, SpO2 95 %.  Medical Problem List and Plan: 1. Functional deficits secondary to left greater trochanter fracture             -pt with prior left BKA and prosthesis- incorporate donning/doffing, use of prosthetic into therapy plan/goals. Socket a little tighter than usual due to sl swelling in LLE from fracture and not wearing prosthesis as much.             -patient may  shower             -ELOS/Goals: 10 days, mod I to supervision with PT, OT   First day of evaluations- con't CIR PT and OT   2.  Antithrombotics: -DVT/anticoagulation:  Pharmaceutical:  Eliquis             -antiplatelet therapy:    3. Pain Management: Tylenol as needed for mild pain             -hydrocodone prn for severe pain             -robaxin prn for spasms             -prn ice   4/27- pain usually controlled per pt- con't regimen- see how he does in therapies 4. Mood/Behavior/Sleep: LCSW to evaluate and provide emotional support             -antipsychotic agents: n/a   5. Neuropsych/cognition: This patient is capable of making decisions on his own behalf.   6. Skin/Wound Care: Routine skin care checks   7. Fluids/Electrolytes/Nutrition: Routine Is and Os and follow-up chemistries             -continue vitamin B12 supplements, oral irom   8: Hypertension: monitor TID and prn (lasix 40 mg not restarted)             -continue amlodipine 10 mg daily             -continue atenolol 25 mg daily             -continue hydralazine 75 mg BID   4/27- BP somewhat elevated in 160s this Am systolic- usually 140s systolic- will monitor for trend for another 24 hours before changing meds 9: Hyperlipidemia: continue statin   10: Atrial fibrillation, chronic: rate controlled; continue Eliquis, BB   11: CAD: stable; s/p CABG 2000; balloon angioplasty 2003             -follows with Dr. Tenny Craw   12: OSA:    13: Anemia: continue oral iron; follow-up CBC   14: Left greater trochanter displacement; non-opertive management             -WBAT   15:  Urinary retention/BPH: continue Proscar and Flomax             -monitor output/PVR's             -OOB to void when possible 16: DM-2: CBGs QID; carb modified diet; A1c 4.8% on 4/08 (diet controlled at home)             -on SSI but not requiring coverage   4/27- CBG's- 98-171- usually below 130- will reduce CBGs to BID-AC since doing well and A1c is 4.8 17: AKI atop CKD 3: baseline creatinine ~1.3 ??             -follow-up BMP  4/27- will recheck Sunday AM and con't to check weekly   18: PAD: s/p left BKA 2019 Dr. Wyn Quaker              -continue Trental   19: Leukocytosis: afebrile; on Zosyn for UTI, started today 4/26             -follow-up sensitives -follow-up CBC 4/27- will recheck in AM/Sunday - is afebrile   21: Pseudomonas (and E Coli) UTI             -continue Zosyn iv x 3 days at least then po cipro x 3 more days per TRH    I spent a total of  35  minutes on total care today- >50% coordination of care- due to  D/w nursing about BP as well as IV ABX and bowels- wait for now for intervention into bowels.      LOS: 1 days A FACE TO FACE EVALUATION WAS PERFORMED  Eric [name] 12/05/2022, 12:13 PM

## 2022-12-05 NOTE — Evaluation (Signed)
Physical Therapy Assessment and Plan  Patient Details  Name: Eric [name]. MRN: [id number] Date of Birth: Dec 29, 1927  PT Diagnosis: Abnormal posture, Abnormality of gait, Difficulty walking, Edema, Impaired sensation, Muscle weakness, and Pain in L hip Rehab Potential: Good ELOS: ~1.5 weeks   Today's Date: 12/05/2022 PT Individual Time: 0910-1029 PT Individual Time Calculation (min): 79 min    Hospital Problem: Principal Problem:   Greater trochanter fracture (HCC) Active Problems:   Traumatic closed trochanteric fracture of femur with minimal displacement with routine healing, left   Past Medical History:  Past Medical History:  Diagnosis Date   Atrial flutter (HCC)    s/p RFCA   CAD (coronary artery disease)    cath 2003, occluded S-RCA, occluded S-Dx, L-LAD ok, s/p PTCA to LAD   Cataract    CKD (chronic kidney disease) stage 3, GFR 30-59 ml/min (HCC)    Diabetes mellitus    diet controlled   Hearing aid worn    bilateral   History of kidney stones    Long term (current) use of anticoagulants    Neuromuscular disorder (HCC)    OSA (obstructive sleep apnea) 12/11   very mild, AHI 7/hr   Persistent atrial fibrillation (HCC) 09/26/2015   Pure hyperglyceridemia    PVD (peripheral vascular disease) (HCC)    angioplasty of his right lower extremity in [place] by Dr.[name] 2013   Unspecified essential hypertension    Wears dentures    full upper and lower   Past Surgical History:  Past Surgical History:  Procedure Laterality Date   ABDOMINAL AORTOGRAM W/LOWER EXTREMITY N/A 11/15/2017   Procedure: ABDOMINAL AORTOGRAM W/LOWER EXTREMITY;  Surgeon: Sherren Kerns, MD;  Location: [facility];  Service: Cardiovascular;  Laterality: N/A;   Adenosine Myoview  3/06   EF 56%, neg. Ischemia   Adenosine Myoview  02/18/07   nml   AMPUTATION Left 12/15/2017   Procedure: AMPUTATION BELOW KNEE;  Surgeon: Annice Needy, MD;  Location: [facility] ORS;  Service: Vascular;   Laterality: Left;   ANGIOPLASTY  1/99   CAD- diogonal with rotational artherectomy   Arthrectomy     of LAD & PTCA   BLEPHAROPLASTY Bilateral    CARDIAC CATHETERIZATION  1/00   CARDIOVERSION  1/04   CARDIOVERSION  5/07   hospital- a flutter   CARPAL TUNNEL RELEASE     ? bilateral   CATARACT EXTRACTION W/PHACO Right 07/28/2017   Procedure: CATARACT EXTRACTION PHACO AND INTRAOCULAR LENS PLACEMENT (IOC) RIGHT DIABETIC;  Surgeon: Lockie Mola, MD;  Location: [facility];  Service: Ophthalmology;  Laterality: Right;  Diabetic - diet controlled   CATARACT EXTRACTION W/PHACO Left 08/18/2017   Procedure: CATARACT EXTRACTION PHACO AND INTRAOCULAR LENS PLACEMENT (IOC);  Surgeon: Lockie Mola, MD;  Location: [facility];  Service: Ophthalmology;  Laterality: Left;  DIABETES - oral meds   COLONOSCOPY W/ BIOPSIES  10/01/06   sigmoid polyp bx neg, 3 years   COLONOSCOPY WITH PROPOFOL N/A 04/28/2021   Procedure: COLONOSCOPY WITH PROPOFOL;  Surgeon: Iva Boop, MD;  Location: [facility] ENDOSCOPY;  Service: Endoscopy;  Laterality: N/A;   CORONARY ANGIOPLASTY  4/03   cutting balloon PTCA pLAD into Diag   CORONARY ARTERY BYPASS GRAFT  2000   LIMA-LAD, SVG-RCA, SVG-Diag; SVG-Diag & SVG-RCA occluded 2003   FRACTURE SURGERY     HAND SURGERY  08/27/09   R thumb procedure wit Scaphoid Gragt and screws, Dr Merlyn Lot   LOWER EXTREMITY ANGIOGRAPHY Right 01/25/2019   Procedure:  LOWER EXTREMITY ANGIOGRAPHY;  Surgeon: Renford Dills, MD;  Location: [facility] INVASIVE CV LAB;  Service: Cardiovascular;  Laterality: Right;   NM MYOVIEW LTD  4/11   normal   PERIPHERAL VASCULAR CATHETERIZATION Right 06/27/2015   Procedure: Lower Extremity Angiography;  Surgeon: Annice Needy, MD;  Location: [facility] INVASIVE CV LAB;  Service: Cardiovascular;  Laterality: Right;   PERIPHERAL VASCULAR CATHETERIZATION  06/27/2015   Procedure: Lower Extremity Intervention;  Surgeon: Annice Needy, MD;  Location: [facility] INVASIVE  CV LAB;  Service: Cardiovascular;;   POLYPECTOMY  04/28/2021   Procedure: POLYPECTOMY;  Surgeon: Iva Boop, MD;  Location: [facility] ENDOSCOPY;  Service: Endoscopy;;    Assessment & Plan Clinical Impression: Patient is a 89 y.o. male presented to the ED on 11/30/2022 complaining of left hip pain. He evidently got up from a chair, felt left hip pain and lowered himself to the floor. No other injury or LOC. He is s/p left BKA in 2019 and does use a prosthesis.  X-ray of patient's left hip shows positive fracture at the left greater trochanter.  Discussed with Dr. Victorino Dike from orthopedics and he saw the patient consultation - non-operative management.  Admitted to the medicine service. He is on Eliquis for atrial fibrillation. This was initially held for possible surgical intervention. Oral anti-hypotensives continued. Found to have AKI atop CKD stage III. Improved with hydration. PMH significant for chronic anemia, CAD, OSA, PVD, hyperlipidemia. DM-2 diet controlled.  UA positive for GNR and started on Zosyn today. Plan is to convert to Cipro. Follow-up culture results.  Able to stand from recliner with min assist, very little WB through LLE while wearing prosthesis. Min assist with small pivotal steps to bed, requires extra time but gradually putting more pressure through LLE. Good control with descent. The patient requires inpatient medicine and rehabilitation evaluations and services for ongoing dysfunction secondary to left greater trochanter fracture; prior left BKA. Patient transferred to CIR on 12/04/2022 .   Patient currently requires mod assist with mobility secondary to muscle weakness, decreased cardiorespiratoy endurance, and decreased standing balance, decreased balance strategies, and difficulty maintaining precautions.  Prior to hospitalization, patient was independent  with mobility and lived with Spouse (wife, Eric [name]) in a House home.  Home access is  Ramped entrance.  Patient will benefit  from skilled PT intervention to maximize safe functional mobility, minimize fall risk, and decrease caregiver burden for planned discharge home with 24 hour supervision.  Anticipate patient will benefit from follow up HH vs OP pending progress and community accessibility at discharge.  PT - End of Session Activity Tolerance: Tolerates 30+ min activity with multiple rests Endurance Deficit: Yes Endurance Deficit Description: requires seated rest breaks due to fatigue and pain PT Assessment Rehab Potential (ACUTE/IP ONLY): Good PT Barriers to Discharge: Decreased caregiver support;Home environment access/layout;Weight bearing restrictions PT Patient demonstrates impairments in the following area(s): Balance;Safety;Sensory;Behavior;Edema;Skin Integrity;Endurance;Motor;Nutrition;Pain;Perception PT Transfers Functional Problem(s): Bed Mobility;Bed to Chair;Car;Furniture PT Locomotion Functional Problem(s): Ambulation;Wheelchair Mobility PT Plan PT Intensity: Minimum of 1-2 x/day ,45 to 90 minutes PT Frequency: 5 out of 7 days PT Duration Estimated Length of Stay: ~1.5 weeks PT Treatment/Interventions: Ambulation/gait training;Community reintegration;DME/adaptive equipment instruction;Neuromuscular re-education;Psychosocial support;UE/LE Strength taining/ROM;Wheelchair propulsion/positioning;Balance/vestibular training;Discharge planning;Skin care/wound Teacher, early years/pre;Therapeutic Activities;UE/LE Coordination activities;Cognitive remediation/compensation;Disease management/prevention;Functional mobility training;Patient/family education;Therapeutic Exercise;Splinting/orthotics PT Transfers Anticipated Outcome(s): CGA using LRAD PT Locomotion Anticipated Outcome(s): CGA limited distances using LRAD otherwise using wheelchair PT Recommendation Follow Up Recommendations: Home health PT;24 hour supervision/assistance;Outpatient PT Patient destination: Home Equipment Recommended: To be  determined   PT Evaluation Precautions/Restrictions Precautions Precautions: Fall;Other (comment) Precaution Comments: h/o L BKA (has prosthetic), L LE WBAT, no active hip abduction Restrictions Weight Bearing Restrictions: Yes LLE Weight Bearing: Weight bearing as tolerated Pain Pain Assessment Pain Scale: 0-10 Pain Score: 9  (when rolling in bed, but goes down to a 2/10 when resting supine in bed after) Pain Type: Acute pain Pain Location: Hip Pain Orientation: Left Pain Descriptors / Indicators: Stabbing (states when pain occurs sometimes it feels like his his is still "breaking") Pain Frequency: Intermittent Pain Onset: With Activity Patients Stated Pain Goal: 0 Pain Intervention(s): Medication (See eMAR);Repositioned;Emotional support;Distraction;Relaxation;Rest Pain Interference Pain Interference Pain Effect on Sleep: 3. Frequently Pain Interference with Therapy Activities: 4. Almost constantly Pain Interference with Day-to-Day Activities: 4. Almost constantly Home Living/Prior Functioning Home Living Available Help at Discharge: Family;Available 24 hours/day (3 children live close by; oldest son comes by almost every day; wife is there 24/7) Type of Home: House Home Access: Ramped entrance Home Layout: Two level Alternate Level Stairs-Number of Steps: stair lift from basement entrance (level entry) - this is how pt usually enters/exits the home Additional Comments: wife mod indep with SPC in house or rollator in community; pt wears prosthetic and mod-I with cane in house or uses wheelchair when takes prosthetic off;  has rollator he uses to walk to mailbox or to go out of the house  Lives With: Spouse (wife, Eric [name]) Prior Function Level of Independence: Independent with gait;Independent with transfers;Independent with basic ADLs  Able to Take Stairs?: No Driving: Yes Vision/Perception  Perception Perception: Within Functional Limits Praxis Praxis: Intact   Cognition Overall Cognitive Status: Within Functional Limits for tasks assessed Arousal/Alertness: Awake/alert Orientation Level: Oriented X4 Year: 2024 Month: April Day of Week: Correct Attention: Focused;Sustained Focused Attention: Appears intact Sustained Attention: Appears intact Awareness: Appears intact Safety/Judgment: Appears intact Sensation Sensation Light Touch: Impaired Detail Peripheral sensation comments: hx of significant peripheral neuropathy in R LE to right up above his knee and has decreased sensation in L LE to right above the knee Light Touch Impaired Details: Impaired RLE Hot/Cold: Not tested Proprioception: Impaired Detail Proprioception Impaired Details: Impaired RLE (pt reports not knowing where his ankle is at unless he looks at it) Stereognosis: Not tested Coordination Gross Motor Movements are Fluid and Coordinated: No Coordination and Movement Description: impaired due to pain limiting with generalized weakness Motor  Motor Motor: Other (comment) Motor - Skilled Clinical Observations: limited LLE movement due to hip pain and generalized weakness   Trunk/Postural Assessment  Cervical Assessment Cervical Assessment: Exceptions to [facility] (slight forward head) Thoracic Assessment Thoracic Assessment: Exceptions to [facility] (slight rounded shoulders with kyphosis) Lumbar Assessment Lumbar Assessment: Exceptions to [facility] (posterior pelvic tilt sitting) Postural Control Postural Control: Deficits on evaluation  Balance Balance Balance Assessed: Yes Static Sitting Balance Static Sitting - Balance Support: Feet supported Static Sitting - Level of Assistance: 5: Stand by assistance Dynamic Sitting Balance Dynamic Sitting - Balance Support: Feet supported Dynamic Sitting - Level of Assistance: 4: Min assist Sitting balance - Comments: dificulty with forward reaching due to hip pain Static Standing Balance Static Standing - Balance Support: During functional  activity;Bilateral upper extremity supported Static Standing - Level of Assistance: 4: Min assist Dynamic Standing Balance Dynamic Standing - Balance Support: During functional activity;Bilateral upper extremity supported Dynamic Standing - Level of Assistance: 3: Mod assist Extremity Assessment      RLE Assessment RLE Assessment: Exceptions to [facility] Active Range of Motion (AROM) Comments:  [facility] General Strength Comments: assessed in supine RLE Strength Right Hip Flexion: 4/5 Right Knee Flexion: 4/5 Right Knee Extension: 4/5 Right Ankle Dorsiflexion: 3+/5 Right Ankle Plantar Flexion: 4-/5 LLE Assessment LLE Assessment: Exceptions to [facility] General Strength Comments: assessed in supine - no resistance applied to hip movements to avoid increased pain LLE AROM (degrees) Left Hip Extension:  (pt wants to keep it flexed into ~45-50degrees for pain management but educated on working towards lying with hip extended, with increased time could get to neutral hip alignment but didn't move into extension) Left Hip Flexion:  (WFL) Left Knee Extension:  (full extension) Left Knee Flexion:  (lacks ~5-10degrees from 90 degrees of flexion) LLE Strength LLE Overall Strength Comments: unable to assess L hip abduction due to restrictions Left Hip Flexion: 3/5 Left Hip Extension: 3/5 (towards extension, but not into extension) Left Knee Flexion: 3+/5 Left Knee Extension: 3+/5  Care Tool Care Tool Bed Mobility Roll left and right activity   Roll left and right assist level: Minimal Assistance - Patient > 75%    Sit to lying activity   Sit to lying assist level: Maximal Assistance - Patient 25 - 49%    Lying to sitting on side of bed activity   Lying to sitting on side of bed assist level: the ability to move from lying on the back to sitting on the side of the bed with no back support.: Moderate Assistance - Patient 50 - 74%     Care Tool Transfers Sit to stand transfer   Sit to stand assist  level: Moderate Assistance - Patient 50 - 74% Sit to stand assistive device: Walker;Armrests  Chair/bed transfer   Chair/bed transfer assist level: Moderate Assistance - Patient 50 - 74% Chair/bed transfer assistive device: Archivist transfer assist level: Moderate Assistance - Patient 50 - 74% Architect Comment: RW    Care Tool Locomotion Ambulation Ambulation activity did not occur: Safety/medical concerns        Walk 10 feet activity Walk 10 feet activity did not occur: Safety/medical concerns       Walk 50 feet with 2 turns activity Walk 50 feet with 2 turns activity did not occur: Safety/medical concerns      Walk 150 feet activity Walk 150 feet activity did not occur: Safety/medical concerns      Walk 10 feet on uneven surfaces activity Walk 10 feet on uneven surfaces activity did not occur: Safety/medical concerns      Stairs Stair activity did not occur: N/A (will not be safe to attempt based on pt's WBing precautions and pt did not perform at baseline, has chair lift)        Walk up/down 1 step activity Walk up/down 1 step or curb (drop down) activity did not occur: N/A      Walk up/down 4 steps activity Walk up/down 4 steps activity did not occur: N/A      Walk up/down 12 steps activity Walk up/down 12 steps activity did not occur: N/A      Pick up small objects from floor   Pick up small object from the floor assist level: Total Assistance - Patient < 25% Pick up small object from the floor assistive device: RW  Wheelchair Is the patient using a wheelchair?: Yes Type of Wheelchair: Manual   Wheelchair assist level: Supervision/Verbal cueing Max wheelchair distance: 178ft  Wheel 50 feet with  2 turns activity   Assist Level: Supervision/Verbal cueing  Wheel 150 feet activity   Assist Level: Supervision/Verbal cueing    Refer to Care Plan for Long Term Goals  SHORT TERM GOAL WEEK  1 PT Short Term Goal 1 (Week 1): Pt will perform supine<>sit with no more than CGA PT Short Term Goal 2 (Week 1): Pt will perform sit<>stands using LRAD with no more than CGA PT Short Term Goal 3 (Week 1): Pt will perform bed<>chair transfers using LRAD with CGA PT Short Term Goal 4 (Week 1): Pt will ambulate at least 28ft using LRAD with CGA  Recommendations for other services: None   Skilled Therapeutic Intervention Pt received supine in bed on the bedpan and agreeable to therapy session. Assisted pt with getting off bedpan and performed LB clothing management and peri-care dependently. Evaluation completed (see details above) with patient education regarding purpose of PT evaluation, PT POC and goals, therapy schedule, weekly team meetings, and other CIR information including safety plan and fall risk safety. Pt performed the below functional mobility tasks with the specified levels of skilled cuing and assistance. Pt noted to be resting L LE elevated significantly on pillows - educated pt on importance of lying L LE flat on bed to avoid hip flexion contracture - with increased time pt able to reach L hip extended to neutral. Pt with significant pain rolling R/L in bed, avoided rolling fully towards L side due to injury. Sitting EOB, pt able to don L LE prosthetic with set-up assistance. Therapist provided pt with different RW for correct height and wheelchair cushion to promote improved upright, OOB activity tolerance. Deferred initiating gait training at this time due to pt's pain level. At end of session, pt left seated in w/c with needs in reach and seat belt alarm on.  Mobility Bed Mobility Bed Mobility: Sit to Supine;Supine to Sit Supine to Sit: Maximal Assistance - Patient - Patient 25-49% Sit to Supine: Moderate Assistance - Patient 50-74% Transfers Transfers: Sit to Stand;Stand to Sit;Stand Pivot Transfers Sit to Stand: Minimal Assistance - Patient > 75%;Moderate Assistance - Patient  50-74% Stand to Sit: Minimal Assistance - Patient > 75%;Moderate Assistance - Patient 50-74% (decreased eccentric control) Stand Pivot Transfers: Minimal Assistance - Patient > 75%;Moderate Assistance - Patient 50 - 74% Stand Pivot Transfer Details: Verbal cues for precautions/safety;Visual cues/gestures for sequencing;Verbal cues for technique;Verbal cues for safe use of DME/AE;Verbal cues for gait pattern;Verbal cues for sequencing;Visual cues/gestures for precautions/safety Transfer (Assistive device): Rolling walker Locomotion  Gait Ambulation: No (deferred due to pain) Gait Gait: No Stairs / Additional Locomotion Stairs: No Wheelchair Mobility Wheelchair Mobility: Yes Wheelchair Assistance: Doctor, general practice: Both upper extremities Wheelchair Parts Management: Needs assistance (pt doesn't use his leg rests at home) Distance: 159ft   Discharge Criteria: Patient will be discharged from PT if patient refuses treatment 3 consecutive times without medical reason, if treatment goals not met, if there is a change in medical status, if patient makes no progress towards goals or if patient is discharged from hospital.  The above assessment, treatment plan, treatment alternatives and goals were discussed and mutually agreed upon: by patient  Ginny Forth , PT, DPT, NCS, CSRS 12/05/2022, 9:51 AM

## 2022-12-06 DIAGNOSIS — S72102D Unspecified trochanteric fracture of left femur, subsequent encounter for closed fracture with routine healing: Secondary | ICD-10-CM

## 2022-12-06 LAB — CBC WITH DIFFERENTIAL/PLATELET
Abs Immature Granulocytes: 0.03 10*3/uL (ref 0.00–0.07)
Basophils Absolute: 0.1 10*3/uL (ref 0.0–0.1)
Basophils Relative: 1 %
Eosinophils Absolute: 0.3 10*3/uL (ref 0.0–0.5)
Eosinophils Relative: 3 %
HCT: 30.2 % — ABNORMAL LOW (ref 39.0–52.0)
Hemoglobin: 9.9 g/dL — ABNORMAL LOW (ref 13.0–17.0)
Immature Granulocytes: 0 %
Lymphocytes Relative: 35 %
Lymphs Abs: 3.7 10*3/uL (ref 0.7–4.0)
MCH: 29.6 pg (ref 26.0–34.0)
MCHC: 32.8 g/dL (ref 30.0–36.0)
MCV: 90.4 fL (ref 80.0–100.0)
Monocytes Absolute: 0.9 10*3/uL (ref 0.1–1.0)
Monocytes Relative: 8 %
Neutro Abs: 5.6 10*3/uL (ref 1.7–7.7)
Neutrophils Relative %: 53 %
Platelets: 186 10*3/uL (ref 150–400)
RBC: 3.34 MIL/uL — ABNORMAL LOW (ref 4.22–5.81)
RDW: 15.9 % — ABNORMAL HIGH (ref 11.5–15.5)
WBC: 10.5 10*3/uL (ref 4.0–10.5)
nRBC: 0 % (ref 0.0–0.2)

## 2022-12-06 LAB — BASIC METABOLIC PANEL
Anion gap: 10 (ref 5–15)
BUN: 30 mg/dL — ABNORMAL HIGH (ref 8–23)
CO2: 22 mmol/L (ref 22–32)
Calcium: 8.6 mg/dL — ABNORMAL LOW (ref 8.9–10.3)
Chloride: 102 mmol/L (ref 98–111)
Creatinine, Ser: 1.41 mg/dL — ABNORMAL HIGH (ref 0.61–1.24)
GFR, Estimated: 47 mL/min — ABNORMAL LOW (ref >60)
Glucose, Bld: 101 mg/dL — ABNORMAL HIGH (ref 70–99)
Potassium: 4.4 mmol/L (ref 3.5–5.1)
Sodium: 134 mmol/L — ABNORMAL LOW (ref 135–145)

## 2022-12-06 LAB — GLUCOSE, CAPILLARY
Glucose-Capillary: 91 mg/dL (ref 70–99)
Glucose-Capillary: 86 mg/dL (ref 70–99)
Glucose-Capillary: 114 mg/dL — ABNORMAL HIGH (ref 70–99)
Glucose-Capillary: 116 mg/dL — ABNORMAL HIGH (ref 70–99)

## 2022-12-06 MED ORDER — SORBITOL 70 % SOLN: 45.0000 mL | Freq: Once | Status: DC

## 2022-12-06 MED ORDER — TAMSULOSIN HCL 0.4 MG PO CAPS
0.8000 mg | ORAL_CAPSULE | Freq: Every day | ORAL | Status: DC
  Administered 2022-12-06 – 2022-12-17 (×12): 0.8 mg via ORAL
  Filled 2022-12-06 (×12): qty 2

## 2022-12-06 MED ORDER — TAMSULOSIN HCL 0.4 MG PO CAPS
0.4000 mg | ORAL_CAPSULE | Freq: Once | ORAL | Status: AC
  Administered 2022-12-06: 0.4 mg via ORAL
  Filled 2022-12-06: qty 1

## 2022-12-06 MED ORDER — TAMSULOSIN HCL 0.4 MG PO CAPS: 0.4000 mg | ORAL_CAPSULE | Freq: Every day | ORAL | Status: DC

## 2022-12-06 NOTE — Progress Notes (Addendum)
Physical Therapy Session Note  Patient Details  Name: Eric [name]. MRN: [id number] Date of Birth: January 17, 1928  Today's Date: 12/06/2022 PT Individual Time: 1300-1358, 4098-1191 PT Individual Time Calculation (min): 58 min, 74 min  Short Term Goals: Week 1:  PT Short Term Goal 1 (Week 1): Pt will perform supine<>sit with no more than CGA PT Short Term Goal 2 (Week 1): Pt will perform sit<>stands using LRAD with no more than CGA PT Short Term Goal 3 (Week 1): Pt will perform bed<>chair transfers using LRAD with CGA PT Short Term Goal 4 (Week 1): Pt will ambulate at least 56ft using LRAD with CGA  Skilled Therapeutic Interventions/Progress Updates:      Treatment session 1 Pt supine in bed asleep upon arrival. Pt awoken and agreeable to therapy. Pt reports 5/10 pain L hip pain and fatigue due to limited sleep last night.   Pt performed supine to sit with pt using functions of bed to raise it to 90 deg with supervision. Therapist suggested laying back down and trialing methods without bed flat, but pt complains of dizziness with sitting EOB. BP 138/59, HR 63, O2 98-100. Pt reports today is the worese he has felt.  Pt reports and demos SOB with donning L LE prosthesis while sitting EOB. O2 sat remains 98-100. Tech in room to assess blood sugar-86. Nursing notified of patients symptoms.   Pt performed sit<>stand from elevated bed and stand pivot transfer bed<> WC with CGA for stability.   Pt transported dependent in WC to day room. Pt performed stand pivot transfer x5 initially with mod A initially, progressing to CGA. Pt reports 8/10 pain with L LE in standing, pt adjusted prosthetic and reports mild pain reduction. Pt reports difficulty weight bearing on L LE to put prosthetic on correctly.   Pt reports seeing black after 3rd sit<>stand BP 136/61, HR 61. Pt reports decreased dizziness with sitting.   Pt performed seated therex: 1x10 LAQ, alternating hip flexion, glute sets. Pt  performed R side with 2.5# ankle weight and L side with body weight only.   Pt performed sit to stand with RW and CGA. Pt performed unilateral arm raises x10 B, pt clapped x3 with B UE.   Pt seated in WC at end of session with all needs within reach, seatbelt alarm on and son in room.    Treatment session 2  Pt supine in bed with HOB elevated with IV in L UE. Pt agreeable to therapy but reports urgent need to use bathroom, requesting bed pan. Pt reports 6/10 L LE pain intermittently but did not interfere with therapy. Therapist provided rest breaks, and seated rest breaks as needed for pain.   Therapist lowered HOB and pt rolled to L with min A with use of handrail as pt therapist placed bed pan. Pt continent of bowel, however shirt and pants slightly soiled from overflow.   Pt attempted rolling L and R with mod A for clean up but unable to tolerate due to pain. Pt performed supine to sit with HOB elevated (therapist attempted with HOB flat and therapist providing mod-max A but pt unable to tolerate due to L LE pain.). Pt donned L LE prosthesis with supervision while sitting EOB. Therapist donned pants/brief sitting EOB, and standing with RW and performed pericare in standing; pt performed sit<>stand with CGA, pt static standing with CGA for stability, therapist performed pericare and donned clothing with max A. Nursing present to detach IV to doff/don shirt. Tech present  to assist with clean up.   Pt transported dependent in [facility] for time/energy conservation.   Pt ambulated 1x10,1x12, 1x25 feet with RW and CGA, with therapist managing IV pole, verbal cuing provided for RW advancement so pt doesn't step into walker, and reciprocal gait as tolerated.   Pt performed static standing with RW as pt placed horseshoe x10 with R UE onto cone positioned to L of pt to facilitate weight shifting to L.   Pt seated in WC at end of session with seatbelt alarm on, and all needs within reach.    Therapy  Documentation Precautions:  Precautions Precautions: Fall, Other (comment) Precaution Comments: h/o L BKA (has prosthetic), L LE WBAT, no active hip abduction Restrictions Weight Bearing Restrictions: No LLE Weight Bearing: Weight bearing as tolerated Other Position/Activity Restrictions: no active hip abduction  Therapy/Group: Individual Therapy  Ambrose [place] 12/06/2022, 7:51 AM

## 2022-12-06 NOTE — Progress Notes (Signed)
Occupational Therapy Session Note  Patient Details  Name: Eric [name]. MRN: [id number] Date of Birth: January 26, 1928  Today's Date: 12/06/2022 OT Individual Time: 1191-4782 OT Individual Time Calculation (min): 58 min    Short Term Goals: Week 1:  OT Short Term Goal 1 (Week 1): Pt will be able to ambulate to bathroom with RW with CGA. OT Short Term Goal 2 (Week 1): Pt will complete stand pivot transfer wc to toilet with Supervision with RW. OT Short Term Goal 3 (Week 1): Pt will be able to don pants over feet with CGA. OT Short Term Goal 4 (Week 1): Pt will be able to pull pants over hips with CGA.  Skilled Therapeutic Interventions/Progress Updates:  Pt received resting in bed for skilled OT session with focus on BADL participation. Pt agreeable to interventions, demonstrating overall pleasant mood. Pt with un-rated pain at beginning of session, sharing "It's about fine right now. . .  I had a really tough time last night. . . By stomach swelled up. . ..". OT offering intermediate rest breaks and positioning suggestions throughout session to address pain/fatigue and maximize participation/safety in session.   Pt comes to EOB with HOB at highest elevation + bed rail. Pt threads sweatpants and dons prosthetic with item-retrieval. Pt stands with bed elevated, requiring overall CGA + RW, donning sweatpants over bottom/hips with min A for material management. Pt ambulates from EOB>BSC in bathroom with CGA + RW, min verbal cuing needed for stepping over threshold. Pt performs 3/3 toileting activities with Min A, using RW and grab bar for transfer. Pt unsuccessful with void despite extended time sitting on toilet and need to return to toilet after standing to don pants, genital bleeding noted, RN made aware.   Pt's vitals noted below, at rest: BP=142/53 HR=61 O2=99  Pt with no reports of dizziness with room-level mobility.   Pt remained on toilet with RN present for cathing. Pt continues to  be appropriate for skilled OT intervention to promote further functional independence.   Therapy Documentation Precautions:  Precautions Precautions: Fall, Other (comment) Precaution Comments: h/o L BKA (has prosthetic), L LE WBAT, no active hip abduction Restrictions Weight Bearing Restrictions: No LLE Weight Bearing: Weight bearing as tolerated Other Position/Activity Restrictions: no active hip abduction   Therapy/Group: Individual Therapy  Lou Cal, OTR/L, MSOT  12/06/2022, 5:22 AM

## 2022-12-06 NOTE — Progress Notes (Signed)
PROGRESS NOTE   Subjective/Complaints:  Abd feels firm and feels constipation- last night required cath -felt like would pop, so tight in bladder- cathed for 850cc.   Will increase Flomax, since on 0.4 mg at home.   Had a lot of clots also in cath- hadn't had urinary retention that he was aware of at home, but has intermittently since here.  Also having some bleeding form penis per OT.   Small BM yesterday but only 1x this week since Monday.   Admits only drinking 3 cups/day.   Feels like needs to "take stool out of rectum".    ROS:  Pt denies SOB, abd pain, CP, N/V/ (+)C/D, and vision changes Urinary retention and constipation Except for HPI  Objective:   No results found. Recent Labs    12/04/22 0431 12/06/22 0634  WBC 12.4* 10.5  HGB 9.7* 9.9*  HCT 29.6* 30.2*  PLT 156 186   Recent Labs    12/05/22 0647 12/06/22 0634  NA 133* 134*  K 4.4 4.4  CL 102 102  CO2 23 22  GLUCOSE 96 101*  BUN 28* 30*  CREATININE 1.34* 1.41*  CALCIUM 8.2* 8.6*    Intake/Output Summary (Last 24 hours) at 12/06/2022 0917 Last data filed at 12/06/2022 0749 Gross per 24 hour  Intake 840 ml  Output 2875 ml  Net -2035 ml        Physical Exam: Vital Signs Blood pressure (!) 137/57, pulse 67, temperature 98.3 F (36.8 C), temperature source Oral, resp. rate 18, height 5\' 5"  (1.651 m), weight 76.6 kg, SpO2 97 %.     General: awake, alert, appropriate, sitting up slightly in bed; working on breakfast; on IV ABX-Zosyn; NAD HENT: conjugate gaze; oropharynx moist CV: regular rate; no JVD Pulmonary: CTA B/L; no W/R/R- good air movement GI: firm- slightly TTP due to being firm it appears; distended; hypoactive BS Psychiatric: appropriate Neurological: Ox3 GU_ a little blood at meatus Musculoskeletal:     Cervical back: Normal range of motion.     Comments: Mild swelling LLE. Left hip with significant tenderness with  movement and palpation.  No improvement so far Skin:    General: Skin is warm and dry.  Neurological:     Mental Status: He is alert.     Comments: Alert and oriented x 3. Normal insight and awareness. Intact Memory. Normal language and speech. Cranial nerve exam unremarkable. MMT: UE 5/5. RLE 4/5. LLE 3/5 HF, KE d/t pain. Sensory exam normal for light touch and pain in all 4 limbs. No limb ataxia or cerebellar signs. No abnormal tone appreciated.  .     Assessment/Plan: 1. Functional deficits which require 3+ hours per day of interdisciplinary therapy in a comprehensive inpatient rehab setting. Physiatrist is providing close team supervision and 24 hour management of active medical problems listed below. Physiatrist and rehab team continue to assess barriers to discharge/monitor patient progress toward functional and medical goals  Care Tool:  Bathing    Body parts bathed by patient: Right arm, Left arm, Chest, Abdomen, Front perineal area, Right upper leg, Left upper leg   Body parts bathed by helper: Left lower leg, Right lower leg,  Buttocks     Bathing assist Assist Level: Moderate Assistance - Patient 50 - 74%     Upper Body Dressing/Undressing Upper body dressing   What is the patient wearing?: Pull over shirt    Upper body assist Assist Level: Set up assist    Lower Body Dressing/Undressing Lower body dressing      What is the patient wearing?: Incontinence brief, Pants     Lower body assist Assist for lower body dressing: Maximal Assistance - Patient 25 - 49%     Toileting Toileting    Toileting assist Assist for toileting: Maximal Assistance - Patient 25 - 49%     Transfers Chair/bed transfer  Transfers assist     Chair/bed transfer assist level: Moderate Assistance - Patient 50 - 74% Chair/bed transfer assistive device: Walker, Armrests   Locomotion Ambulation   Ambulation assist   Ambulation activity did not occur: Safety/medical concerns           Walk 10 feet activity   Assist  Walk 10 feet activity did not occur: Safety/medical concerns        Walk 50 feet activity   Assist Walk 50 feet with 2 turns activity did not occur: Safety/medical concerns         Walk 150 feet activity   Assist Walk 150 feet activity did not occur: Safety/medical concerns         Walk 10 feet on uneven surface  activity   Assist Walk 10 feet on uneven surfaces activity did not occur: Safety/medical concerns         Wheelchair     Assist Is the patient using a wheelchair?: Yes Type of Wheelchair: Manual    Wheelchair assist level: Supervision/Verbal cueing Max wheelchair distance: 182ft    Wheelchair 50 feet with 2 turns activity    Assist        Assist Level: Supervision/Verbal cueing   Wheelchair 150 feet activity     Assist      Assist Level: Supervision/Verbal cueing   Blood pressure (!) 137/57, pulse 67, temperature 98.3 F (36.8 C), temperature source Oral, resp. rate 18, height 5\' 5"  (1.651 m), weight 76.6 kg, SpO2 97 %.  Medical Problem List and Plan: 1. Functional deficits secondary to left greater trochanter fracture             -pt with prior left BKA and prosthesis- incorporate donning/doffing, use of prosthetic into therapy plan/goals. Socket a little tighter than usual due to sl swelling in LLE from fracture and not wearing prosthesis as much.             -patient may  shower             -ELOS/Goals: 10 days, mod I to supervision with PT, OT   Con't CIR PT and OT- pt's B/B is limiting time in therapy today 2.  Antithrombotics: -DVT/anticoagulation:  Pharmaceutical: Eliquis             -antiplatelet therapy:    3. Pain Management: Tylenol as needed for mild pain             -hydrocodone prn for severe pain             -robaxin prn for spasms             -prn ice   4/27- pain usually controlled per pt- con't regimen- see how he does in therapies 4. Mood/Behavior/Sleep: LCSW  to evaluate and provide emotional support             -  antipsychotic agents: n/a   5. Neuropsych/cognition: This patient is capable of making decisions on his own behalf.   6. Skin/Wound Care: Routine skin care checks   7. Fluids/Electrolytes/Nutrition: Routine Is and Os and follow-up chemistries             -continue vitamin B12 supplements, oral irom   8: Hypertension: monitor TID and prn (lasix 40 mg not restarted)             -continue amlodipine 10 mg daily             -continue atenolol 25 mg daily             -continue hydralazine 75 mg BID   4/27- BP somewhat elevated in 160s this Am systolic- usually 140s systolic- will monitor for trend for another 24 hours before changing meds  4/28- BP doing better- con't regimen 9: Hyperlipidemia: continue statin   10: Atrial fibrillation, chronic: rate controlled; continue Eliquis, BB   11: CAD: stable; s/p CABG 2000; balloon angioplasty 2003             -follows with Dr. Tenny Craw   12: OSA:    13: Anemia: continue oral iron; follow-up CBC   14: Left greater trochanter displacement; non-opertive management             -WBAT   15: Urinary retention/BPH: continue Proscar and Flomax             -monitor output/PVR's             -OOB to void when possible  4/28- Changed Flomax to q supper- also on Proscar- having bleeding form penis- could be why had blood clots- UTI can explain, but will recheck labs in AM to make sure Hb stable- so far, is 9.9 and stable from prior days.  16: DM-2: CBGs QID; carb modified diet; A1c 4.8% on 4/08 (diet controlled at home)             -on SSI but not requiring coverage   4/27- CBG's- 98-171- usually below 130- will reduce CBGs to BID-AC since doing well and A1c is 4.8 17: AKI atop CKD 3: baseline creatinine ~1.3 ??             -follow-up BMP  4/27- will recheck Sunday AM and con't to check weekly   4/28- Cr up slightly to 1.41 from 1.34- baseline Cr 1.3- pt drinking ~ 3 cups/water /day- asked him to drink  6-8 cups/day and will recheck in AM 18: PAD: s/p left BKA 2019 Dr. Wyn Quaker             -continue Trental   19: Leukocytosis: afebrile; on Zosyn for UTI, started today 4/26             -follow-up sensitives -follow-up CBC 4/27- will recheck in AM/Sunday - is afebrile 4/28- WBC down to 10.5 from 12.4 21: Pseudomonas (and E Coli) UTI             -continue Zosyn iv x 3 days at least then po cipro x 3 more days per TRH  22. Urinary retention  4/28- started Flomax- needed in/out cath due to retention- likely due to UTI and constipation 23. Constipation  4/28- pt has stool ball, it sounds like- trying to break up- also ordered Sorbitol 45cc this AM- and asked nursing to do digital disimpaction if need be.    I spent a total of  52  minutes on total care today- >50%  coordination of care- due to  D/w OT as well as nursing x2 and d/w pt about drinking water and bleeding from penis.   LOS: 2 days A FACE TO FACE EVALUATION WAS PERFORMED  Eric [name] 12/06/2022, 9:17 AM

## 2022-12-07 DIAGNOSIS — S72109A Unspecified trochanteric fracture of unspecified femur, initial encounter for closed fracture: Secondary | ICD-10-CM | POA: Diagnosis not present

## 2022-12-07 LAB — BASIC METABOLIC PANEL
Anion gap: 6 (ref 5–15)
BUN: 26 mg/dL — ABNORMAL HIGH (ref 8–23)
CO2: 24 mmol/L (ref 22–32)
Calcium: 8.2 mg/dL — ABNORMAL LOW (ref 8.9–10.3)
Chloride: 104 mmol/L (ref 98–111)
Creatinine, Ser: 1.42 mg/dL — ABNORMAL HIGH (ref 0.61–1.24)
GFR, Estimated: 46 mL/min — ABNORMAL LOW (ref >60)
Glucose, Bld: 85 mg/dL (ref 70–99)
Potassium: 4.3 mmol/L (ref 3.5–5.1)
Sodium: 134 mmol/L — ABNORMAL LOW (ref 135–145)

## 2022-12-07 LAB — CBC
HCT: 28.8 % — ABNORMAL LOW (ref 39.0–52.0)
Hemoglobin: 9.3 g/dL — ABNORMAL LOW (ref 13.0–17.0)
MCH: 29.8 pg (ref 26.0–34.0)
MCHC: 32.3 g/dL (ref 30.0–36.0)
MCV: 92.3 fL (ref 80.0–100.0)
Platelets: 197 10*3/uL (ref 150–400)
RBC: 3.12 MIL/uL — ABNORMAL LOW (ref 4.22–5.81)
RDW: 16 % — ABNORMAL HIGH (ref 11.5–15.5)
WBC: 9.7 10*3/uL (ref 4.0–10.5)
nRBC: 0 % (ref 0.0–0.2)

## 2022-12-07 LAB — GLUCOSE, CAPILLARY
Glucose-Capillary: 83 mg/dL (ref 70–99)
Glucose-Capillary: 105 mg/dL — ABNORMAL HIGH (ref 70–99)
Glucose-Capillary: 107 mg/dL — ABNORMAL HIGH (ref 70–99)
Glucose-Capillary: 137 mg/dL — ABNORMAL HIGH (ref 70–99)

## 2022-12-07 MED ORDER — SENNOSIDES-DOCUSATE SODIUM 8.6-50 MG PO TABS
1.0000 | ORAL_TABLET | Freq: Two times a day (BID) | ORAL | Status: DC
  Administered 2022-12-07 – 2022-12-18 (×22): 1 via ORAL
  Filled 2022-12-07 (×24): qty 1

## 2022-12-07 NOTE — Progress Notes (Signed)
Physical Therapy Session Note  Patient Details  Name: Eric [name]. MRN: [id number] Date of Birth: [birth year]  Today's Date: 12/07/2022 PT Individual Time: 0803-0917 PT Individual Time Calculation (min): 74 min   Short Term Goals: Week 1:  PT Short Term Goal 1 (Week 1): Pt will perform supine<>sit with no more than CGA PT Short Term Goal 2 (Week 1): Pt will perform sit<>stands using LRAD with no more than CGA PT Short Term Goal 3 (Week 1): Pt will perform bed<>chair transfers using LRAD with CGA PT Short Term Goal 4 (Week 1): Pt will ambulate at least 69ft using LRAD with CGA  Skilled Therapeutic Interventions/Progress Updates:    Patient received resting in bed and agreeable to therapy session. Reports no discomfort in Lt LE at rest but need to void bladder. Pt preferring to void with urinal in bed, pt taking extra time to manage brief but able to unhook velcro sides and set up urinal with supervision only. Cues required to sequence hand placement on bed rail to move supine>sit EOB; pt able to pivot hips and scoot to edge with CGA for safety. Pt donned Lt BKA liner and socket at EOB with supervision and donned prosthetic to first several notches. Sit<>stand 2x from EOB for fulling seating/donning prosthetic. Pt c/o improper fit and sat EOB to remove and change sock thickness then re-don socket and prosthesis. Stand pivot transfer then completed with RW bed>WC, CGA. Pt self propelled in WC from room to day room gym with bil UE and supervision. Cues needed to release wheel while rolling. Pt completed stan pivot transfer WC>EOM table with CGA and seated LE exercises performed at [facility].  Seated & Standing LE strengthening at EOM: - LAQ bil LE 3x10 reps,6lbs on Rt LE, no weight on Lt LE (just prosthetic) - Hamstring Curl 2x10 reps Bil LE, red TB for resistance bil - Marching bil LE 2x10 reps, no weight on bil LE  - 2x5 reps sit<>stand to RW  Gait training completed from with WC follow, pt amb  89' with RW and Min Assist to CGA, pt's gait antalgic with shortened step length Rt>Lt. Pt required 1x standing rest break during bout. Pt self propelled back to room with bil UE use and supervision. Once returned to room pt requesting to adjust prosthesis and exchanged sock for thicker sock then re-donned prosthetic and performed 1x sit<>stand from [facility] to fully don; CGA to power up with min assist to steady in standing. EOS pt resting in WC with Alarm on and call bell within reach.      Therapy Documentation Precautions:  Precautions Precautions: Fall, Other (comment) Precaution Comments: h/o L BKA (has prosthetic), L LE WBAT, no active hip abduction Restrictions Weight Bearing Restrictions: No LLE Weight Bearing: Weight bearing as tolerated Other Position/Activity Restrictions: no active hip abduction  Pain:  Pt reports 2-3/10 at rest and 6-8/10 with weight bearing on Lt LE. RN provided medication during session and therapeutic rest provided.    Therapy/Group: Individual Therapy   Wynn Maudlin, DPT Acute Rehabilitation Services Office [phone]  12/07/22 7:40 AM

## 2022-12-07 NOTE — IPOC Note (Signed)
Overall Plan of Care [facility]) Patient Details Name: Eric [name]. MRN: [id number] DOB: 04-02-1928  Admitting Diagnosis: Greater trochanter fracture [facility])  Hospital Problems: Principal Problem:   Greater trochanter fracture (HCC) Active Problems:   Traumatic closed trochanteric fracture of femur with minimal displacement with routine healing, left     Functional Problem List: Nursing Safety, Bladder, Sensory, Edema, Endurance, Pain  PT Balance, Safety, Sensory, Behavior, Edema, Skin Integrity, Endurance, Motor, Nutrition, Pain, Perception  OT Balance, Endurance, Motor, Pain  SLP    TR         Basic ADL's: OT Grooming, Bathing, Dressing, Toileting     Advanced  ADL's: OT       Transfers: PT Bed Mobility, Bed to Chair, Car, Occupational psychologist, Research scientist (life sciences): PT Ambulation, Psychologist, prison and probation services     Additional Impairments: OT None  SLP        TR      Anticipated Outcomes Item Anticipated Outcome  Self Feeding no goal, pt is independent  Swallowing      Basic self-care  supervision  Toileting  supervision   Bathroom Transfers supervision  Bowel/Bladder  continent B/B  Transfers  CGA using LRAD  Locomotion  CGA limited distances using LRAD otherwise using wheelchair  Communication     Cognition     Pain  less than 4  Safety/Judgment  remain fall free while in rehab   Therapy Plan: PT Intensity: Minimum of 1-2 x/day ,45 to 90 minutes PT Frequency: 5 out of 7 days PT Duration Estimated Length of Stay: ~1.5 weeks OT Intensity: Minimum of 1-2 x/day, 45 to 90 minutes OT Frequency: 5 out of 7 days OT Duration/Estimated Length of Stay: 10-12 days     Team Interventions: Nursing Interventions Patient/Family Education, Pain Management, Bladder Management, Discharge Planning, Disease Management/Prevention  PT interventions Ambulation/gait training, Community reintegration, DME/adaptive equipment instruction, Neuromuscular re-education,  Psychosocial support, UE/LE Strength taining/ROM, Wheelchair propulsion/positioning, Warden/ranger, Discharge planning, Skin care/wound management, Pain management, Therapeutic Activities, UE/LE Coordination activities, Cognitive remediation/compensation, Disease management/prevention, Functional mobility training, Patient/family education, Therapeutic Exercise, Splinting/orthotics  OT Interventions Warden/ranger, DME/adaptive equipment instruction, Discharge planning, Pain management, Patient/family education, Psychosocial support, Therapeutic Exercise, Self Care/advanced ADL retraining, Therapeutic Activities, UE/LE Strength taining/ROM, Functional mobility training  SLP Interventions    TR Interventions    SW/CM Interventions     Barriers to Discharge MD  Medical stability, Home enviroment access/loayout, IV antibiotics, Lack of/limited family support, Weight, and Weight bearing restrictions  Nursing Decreased caregiver support, Weight bearing restrictions home with wife; uses chair lift to reach B/B  PT Decreased caregiver support, Home environment access/layout, Weight bearing restrictions    OT      SLP      SW       Team Discharge Planning: Destination: PT-Home ,OT- Home , SLP-  Projected Follow-up: PT-Home health PT, 24 hour supervision/assistance, Outpatient PT, OT-  Home health OT, SLP-  Projected Equipment Needs: PT-To be determined, OT- no equipment needs , SLP-  Equipment Details: PT- , OT- n/a Patient/family involved in discharge planning: PT- Patient,  OT-Patient, Family member/caregiver, SLP-   MD ELOS: 10-12 days Medical Rehab Prognosis:  Good Assessment: The patient has been admitted for CIR therapies with the diagnosis of Great trochanteric fx with UTI on IV ABX. The team will be addressing functional mobility, strength, stamina, balance, safety, adaptive techniques and equipment, self-care, bowel and bladder mgt, patient and caregiver  education, IV ABX. Goals have been  set at supervision to CGA. Anticipated discharge destination is home.        See Team Conference Notes for weekly updates to the plan of care

## 2022-12-07 NOTE — Progress Notes (Signed)
PROGRESS NOTE   Subjective/Complaints:  Pt reports urinated this AM, but required caths overnight.  Had 2 Bms yesterday with Sorbitol- feels much better.   Ate most of tray.     ROS:   Pt denies SOB, abd pain, CP, N/V/C/D, and vision changes Urinary retention (+) Except for HPI  Objective:   No results found. Recent Labs    12/06/22 0634 12/07/22 0545  WBC 10.5 9.7  HGB 9.9* 9.3*  HCT 30.2* 28.8*  PLT 186 197   Recent Labs    12/06/22 0634 12/07/22 0545  NA 134* 134*  K 4.4 4.3  CL 102 104  CO2 22 24  GLUCOSE 101* 85  BUN 30* 26*  CREATININE 1.41* 1.42*  CALCIUM 8.6* 8.2*    Intake/Output Summary (Last 24 hours) at 12/07/2022 1005 Last data filed at 12/07/2022 0816 Gross per 24 hour  Intake 413 ml  Output 1540 ml  Net -1127 ml        Physical Exam: Vital Signs Blood pressure (!) 137/50, pulse (!) 53, temperature 98.5 F (36.9 C), resp. rate 16, height 5\' 5"  (1.651 m), weight 76.6 kg, SpO2 98 %.      General: awake, alert, appropriate, sitting up in bed; looks more comfortable; on IV ABX; NAD HENT: conjugate gaze; oropharynx moist CV: bradycardic rate; no JVD Pulmonary: CTA B/L; no W/R/R- good air movement GI: soft, NT, ND, (+)BS-more normoactive Psychiatric: appropriate Neurological: Ox3  Musculoskeletal:     Cervical back: Normal range of motion.     Comments: Mild swelling LLE. Left hip with significant tenderness with movement and palpation.  No improvement so far Skin:    General: Skin is warm and dry.  Neurological:     Mental Status: He is alert.     Comments: Alert and oriented x 3. Normal insight and awareness. Intact Memory. Normal language and speech. Cranial nerve exam unremarkable. MMT: UE 5/5. RLE 4/5. LLE 3/5 HF, KE d/t pain. Sensory exam normal for light touch and pain in all 4 limbs. No limb ataxia or cerebellar signs. No abnormal tone appreciated.  .      Assessment/Plan: 1. Functional deficits which require 3+ hours per day of interdisciplinary therapy in a comprehensive inpatient rehab setting. Physiatrist is providing close team supervision and 24 hour management of active medical problems listed below. Physiatrist and rehab team continue to assess barriers to discharge/monitor patient progress toward functional and medical goals  Care Tool:  Bathing    Body parts bathed by patient: Right arm, Left arm, Chest, Abdomen, Front perineal area, Right upper leg, Left upper leg   Body parts bathed by helper: Left lower leg, Right lower leg, Buttocks     Bathing assist Assist Level: Moderate Assistance - Patient 50 - 74%     Upper Body Dressing/Undressing Upper body dressing   What is the patient wearing?: Pull over shirt    Upper body assist Assist Level: Set up assist    Lower Body Dressing/Undressing Lower body dressing      What is the patient wearing?: Incontinence brief, Pants     Lower body assist Assist for lower body dressing: Maximal Assistance - Patient 25 -  49%     Toileting Toileting    Toileting assist Assist for toileting: Maximal Assistance - Patient 25 - 49%     Transfers Chair/bed transfer  Transfers assist  Chair/bed transfer activity did not occur: Safety/medical concerns  Chair/bed transfer assist level: Moderate Assistance - Patient 50 - 74% Chair/bed transfer assistive device: Walker, Armrests   Locomotion Ambulation   Ambulation assist   Ambulation activity did not occur: Safety/medical concerns          Walk 10 feet activity   Assist  Walk 10 feet activity did not occur: Safety/medical concerns        Walk 50 feet activity   Assist Walk 50 feet with 2 turns activity did not occur: Safety/medical concerns         Walk 150 feet activity   Assist Walk 150 feet activity did not occur: Safety/medical concerns         Walk 10 feet on uneven surface   activity   Assist Walk 10 feet on uneven surfaces activity did not occur: Safety/medical concerns         Wheelchair     Assist Is the patient using a wheelchair?: Yes Type of Wheelchair: Manual    Wheelchair assist level: Supervision/Verbal cueing Max wheelchair distance: 133ft    Wheelchair 50 feet with 2 turns activity    Assist        Assist Level: Supervision/Verbal cueing   Wheelchair 150 feet activity     Assist      Assist Level: Supervision/Verbal cueing   Blood pressure (!) 137/50, pulse (!) 53, temperature 98.5 F (36.9 C), resp. rate 16, height 5\' 5"  (1.651 m), weight 76.6 kg, SpO2 98 %.  Medical Problem List and Plan: 1. Functional deficits secondary to left greater trochanter fracture             -pt with prior left BKA and prosthesis- incorporate donning/doffing, use of prosthetic into therapy plan/goals. Socket a little tighter than usual due to sl swelling in LLE from fracture and not wearing prosthesis as much.             -patient may  shower             -ELOS/Goals: 10 days, mod I to supervision with PT, OT   Con't CIR PT and OT- IPOC today 2.  Antithrombotics: -DVT/anticoagulation:  Pharmaceutical: Eliquis             -antiplatelet therapy:    3. Pain Management: Tylenol as needed for mild pain             -hydrocodone prn for severe pain             -robaxin prn for spasms             -prn ice   4/27- pain usually controlled per pt- con't regimen- see how he does in therapies 4. Mood/Behavior/Sleep: LCSW to evaluate and provide emotional support             -antipsychotic agents: n/a   5. Neuropsych/cognition: This patient is capable of making decisions on his own behalf.   6. Skin/Wound Care: Routine skin care checks   7. Fluids/Electrolytes/Nutrition: Routine Is and Os and follow-up chemistries             -continue vitamin B12 supplements, oral irom   8: Hypertension: monitor TID and prn (lasix 40 mg not restarted)              -  continue amlodipine 10 mg daily             -continue atenolol 25 mg daily             -continue hydralazine 75 mg BID   4/27- BP somewhat elevated in 160s this Am systolic- usually 140s systolic- will monitor for trend for another 24 hours before changing meds  4/28-4/29 BP doing better- con't regimen 9: Hyperlipidemia: continue statin   10: Atrial fibrillation, chronic: rate controlled; continue Eliquis, BB   11: CAD: stable; s/p CABG 2000; balloon angioplasty 2003             -follows with Dr. Tenny Craw   12: OSA:    13: Anemia: continue oral iron; follow-up CBC   14: Left greater trochanter displacement; non-opertive management             -WBAT   15: Urinary retention/BPH: continue Proscar and Flomax             -monitor output/PVR's             -OOB to void when possible  4/28- Changed Flomax to q supper- also on Proscar- having bleeding form penis- could be why had blood clots- UTI can explain, but will recheck labs in AM to make sure Hb stable- so far, is 9.9 and stable from prior days. 4/29-  Hb has dropped some 9.3- will recheck in Am and if drops again, will call Urology.  16: DM-2: CBGs QID; carb modified diet; A1c 4.8% on 4/08 (diet controlled at home)             -on SSI but not requiring coverage   4/27- CBG's- 98-171- usually below 130- will reduce CBGs to BID-AC since doing well and A1c is 4.8 17: AKI atop CKD 3: baseline creatinine ~1.3 ??             -follow-up BMP  4/27- will recheck Sunday AM and con't to check weekly   4/28- Cr up slightly to 1.41 from 1.34- baseline Cr 1.3- pt drinking ~ 3 cups/water /day- asked him to drink 6-8 cups/day and will recheck in AM  4/29- Cr stable at 1.4- will con't topush fluids by mouth-recheck Thursday 18: PAD: s/p left BKA 2019 Dr. Wyn Quaker             -continue Trental   19: Leukocytosis: afebrile; on Zosyn for UTI, started today 4/26             -follow-up sensitives -follow-up CBC 4/27- will recheck in AM/Sunday - is  afebrile 4/28- WBC down to 10.5 from 12.4 4/29- WBC down to 9.7k 21: Pseudomonas (and E Coli) UTI             -continue Zosyn iv x 3 days at least then po cipro x 3 more days per [facility]  4/29- changing to Cipro today- having blood clots and sediment in urine- likely due to UTI  22. Urinary retention  4/28- started Flomax- needed in/out cath due to retention- likely due to UTI and constipation  4/29- peed this Am; required cath last night-overnight- still has blood clots- per documentation. But voiding better 23. Constipation  4/28- pt has stool ball, it sounds like- trying to break up- also ordered Sorbitol 45cc this AM- and asked nursing to do digital disimpaction if need be.   4/29- LBM yesterday x2- feels better- will increase bowel meds- add Senokot-S 1 tab BID  I spent a total of  41  minutes  on total care today- >50% coordination of care- due to  D/w pt about Urination/needing cathing as well as d/w nursing- also IPOC.   LOS: 3 days A FACE TO FACE EVALUATION WAS PERFORMED  Meridith Romick 12/07/2022, 10:05 AM

## 2022-12-07 NOTE — Progress Notes (Signed)
Physical Therapy Session Note  Patient Details  Name: Eric [name]. MRN: [id number] Date of Birth: January 16, 1928  Today's Date: 12/07/2022 PT Individual Time: 1415-1457 PT Individual Time Calculation (min): 42 min   Short Term Goals: Week 1:  PT Short Term Goal 1 (Week 1): Pt will perform supine<>sit with no more than CGA PT Short Term Goal 2 (Week 1): Pt will perform sit<>stands using LRAD with no more than CGA PT Short Term Goal 3 (Week 1): Pt will perform bed<>chair transfers using LRAD with CGA PT Short Term Goal 4 (Week 1): Pt will ambulate at least 15ft using LRAD with CGA  Skilled Therapeutic Interventions/Progress Updates:   Received pt sitting in WC, pt agreeable to PT treatment, and reported pain 7-8/10 in L hip and along distal aspect of residual limb - RN notified and present to administer pain medication. Pt stood x 2 trials from [facility] with RW and CGA. Upon standing, pt reported increased pain in L hip and along tibial tuberosity of LLE when weight bearing. Attempted to "loosen" leg while standing by performing active hip flexion ROM with no relief. Returned to sitting and removed prosthetic - no areas of irritation/redness noted and pt reported he was able to walk fine this morning. Donned prosthetic again and attempted to stand - again, continued pain. Pt reported it has been ~5 years since receiving current prosthetic - encouraged pt to talk with primary PT and reach out to [employer] to make sure socket is fitting properly (provided pt with contact info). Pt ultimately unable to ambulate this afternoon, and upset stating "I would love to walk with you, but I just can't right now" due to pain. Pt requested to return to bed and transferred WC<>bed stand<>pivot to R without AD and CGA. Doffed prosthetic and transferred sit<>supine with min A for LLE management. Concluded session with pt semi-reclined in bed, needs within reach, and bed alarm on.   Therapy Documentation Precautions:   Precautions Precautions: Fall, Other (comment) Precaution Comments: h/o L BKA (has prosthetic), L LE WBAT, no active hip abduction Restrictions Weight Bearing Restrictions: No LLE Weight Bearing: Weight bearing as tolerated Other Position/Activity Restrictions: no active hip abduction  Therapy/Group: Individual Therapy Martin Majestic PT, DPT  12/07/2022, 7:20 AM

## 2022-12-07 NOTE — Progress Notes (Signed)
Met with patient. Oriented to rehab and team conference. Discussed bone health and diet modifications for DMII. Doesn't take insulin at home. Said that had increased pain today but had just taken something for pain. Offered heating pad for pain but declined at this time. Said that tried ice and that really didn't work. Also reported that prosthesis didn't feel right today. Was given the number to [employer] to see if can make some adjustments. Prosthesis is 87 years old. Also discussed his being cath'd at [facility] and encouraged to get up to the toilet to empty bladder. Reports that already uses message when going and has been an ongoing issue for while. Again encouraged to get up. ABT completed for UTI. Placed SCD to right lower extremity.  All needs met, call bell in reach.

## 2022-12-07 NOTE — Progress Notes (Signed)
Occupational Therapy Session Note  Patient Details  Name: Eric [name]. MRN: [id number] Date of Birth: 03-12-1928  Today's Date: 12/07/2022 OT Individual Time: 1300-1345 OT Individual Time Calculation (min): 45 min    Short Term Goals: Week 1:  OT Short Term Goal 1 (Week 1): Pt will be able to ambulate to bathroom with RW with CGA. OT Short Term Goal 2 (Week 1): Pt will complete stand pivot transfer wc to toilet with Supervision with RW. OT Short Term Goal 3 (Week 1): Pt will be able to don pants over feet with CGA. OT Short Term Goal 4 (Week 1): Pt will be able to pull pants over hips with CGA.  Skilled Therapeutic Interventions/Progress Updates:    At start of session pt sitting on toilet with stedy lift in front of him.  On eval, pt was min A with stand pivot with RW toilet >< w/c.  Safety plan had been changed to stedy +2.  Pt reported that Sunday nursing staff had difficulty with his transfer and felt he was safer with the stedy.   Pt able to lean to self cleanse.  He stood up from [facility] over toilet to RW with min A.  Pt held balance with S for therapist to manage clothing over his hips.  Pt stated he had increased pain in L hip from mobility earlier today.  Pt able to step transfer to wc with min A but needed mod A on last step as his L hip was less stable due to pain.  Pt requesting pain medication but too early per nursing but he was given a muscle relaxant.  Due to hip pain and another PT session in less than 1 hour, had pt focus on UE strengthening exercises.  Pt taken to ortho gym to work on arm bike at resistance 6 for 2 min on, 30 sec rest for 10 min. He then self propelled to next gym to work with 4 lb dowel bar and to show me his HEP using 4 lb hand weights. Pt does very high reps at home with 3 lbs.  Recommended he break down his 40 reps at once into 3 sets of 12 -15.  Pt self propelled back to his room. Pt resting in room with belt alarm on and all needs met.    Therapy  Documentation Precautions:  Precautions Precautions: Fall, Other (comment) Precaution Comments: h/o L BKA (has prosthetic), L LE WBAT, no active hip abduction Restrictions Weight Bearing Restrictions: No LLE Weight Bearing: Weight bearing as tolerated Other Position/Activity Restrictions: no active hip abduction    Vital Signs: Therapy Vitals Temp: 97.9 F (36.6 C) Pulse Rate: (Abnormal) 54 Resp: 17 BP: (Abnormal) 141/53 Patient Position (if appropriate): Lying Oxygen Therapy SpO2: 98 % O2 Device: Room Air Pain: Pain Assessment Pain Scale: 0-10 Pain Score: 9  Pain Location: Hip Pain Orientation: Left Pain Intervention(s): Medication (See eMAR) ADL: ADL Eating: Independent Grooming: Setup Upper Body Bathing: Setup Lower Body Bathing: Moderate assistance Upper Body Dressing: Setup Lower Body Dressing: Maximal assistance Toileting: Maximal assistance Toilet Transfer: Minimal assistance Toilet Transfer Method: Stand pivot Toilet Transfer Equipment: Raised toilet seat   Therapy/Group: Individual Therapy  Sache Sane 12/07/2022, 4:40 PM

## 2022-12-07 NOTE — Progress Notes (Signed)
Patient ID: Eric [name]., male   DOB: Dec 08, 1927, 89 y.o.   MRN: [id number]  This SW covering for primary SW, Becky Dupree.   SW  made efforts to contact pt son [name] [phone]) however when calling number, the person would continue to hang up the phone. SW called and spoke with pt son Tresa Endo to introduce self, explain role, inform on ELOS, and discuss discharge process. Confirms that [name] is primary. SW shared above and he states he will have him call this SW.   0946-SW received a phone call from pt son [name] to discuss above. Confirms d/c plan is for their dad to d/c to home with their mother. States one of the children will be there almost daily. SW shared will follow-up with updates after team conference, and Kriste Basque will coordinate care thereafter.   Cecile Sheerer, MSW, LCSWA Office: [phone] Cell: [phone] Fax: [phone]

## 2022-12-07 NOTE — Progress Notes (Signed)
Inpatient Rehabilitation  Patient information reviewed and entered into eRehab system by Dvid Pendry Lorna Strother, OTR/L, Rehab Quality Coordinator.   Information including medical coding, functional ability and quality indicators will be reviewed and updated through discharge.   

## 2022-12-07 NOTE — Care Management (Signed)
Inpatient Rehabilitation Center Individual Statement of Services  Patient Name:  Daveyon Kitchings.  Date:  12/07/2022  Welcome to the Inpatient Rehabilitation Center.  Our goal is to provide you with an individualized program based on your diagnosis and situation, designed to meet your specific needs.  With this comprehensive rehabilitation program, you will be expected to participate in at least 3 hours of rehabilitation therapies Monday-Friday, with modified therapy programming on the weekends.  Your rehabilitation program will include the following services:  Physical Therapy (PT), Occupational Therapy (OT), 24 hour per day rehabilitation nursing, Therapeutic Recreaction (TR), Psychology, Neuropsychology, Care Coordinator, Rehabilitation Medicine, Nutrition Services, Pharmacy Services, and Other  Weekly team conferences will be held on Tuesdays to discuss your progress.  Your Inpatient Rehabilitation Care Coordinator will talk with you frequently to get your input and to update you on team discussions.  Team conferences with you and your family in attendance may also be held.  Expected length of stay: 1.5 weeks    Overall anticipated outcome: Supervision  Depending on your progress and recovery, your program may change. Your Inpatient Rehabilitation Care Coordinator will coordinate services and will keep you informed of any changes. Your Inpatient Rehabilitation Care Coordinator's name and contact numbers are listed  below.  The following services may also be recommended but are not provided by the Inpatient Rehabilitation Center:  Driving Evaluations Home Health Rehabiltiation Services Outpatient Rehabilitation Services Vocational Rehabilitation   Arrangements will be made to provide these services after discharge if needed.  Arrangements include referral to agencies that provide these services.  Your insurance has been verified to be:  [employer]  Your primary doctor is:   Lynnea Ferrier  Pertinent information will be shared with your doctor and your insurance company.  Inpatient Rehabilitation Care Coordinator:  Dossie Der, [place] [phone] or Luna Glasgow  Information discussed with and copy given to patient by: Gretchen Short, 12/07/2022, 9:27 AM

## 2022-12-07 NOTE — Discharge Summary (Signed)
Physician Discharge Summary  Patient ID: Eric [name]. MRN: [id number] DOB/AGE: 87-Sep-1929 87 y.o.  Admit date: 12/04/2022 Discharge date: 12/18/2022  Discharge Diagnoses:  Principal Problem:   Greater trochanter fracture [facility]) Active Problems:   Traumatic closed trochanteric fracture of femur with minimal displacement with routine healing, left   Coping style affecting medical condition Hypertension Hyperlipidemia Atrial fibrillation CAD/CABG OSA Anemia BPH Diabetes mellitus AKI/CKD stage III UTI Constipation History of left BKA  Discharged Condition: stable  Significant Diagnostic Studies:  Narrative & Impression  CLINICAL DATA:  Shortness breath   EXAM: PORTABLE CHEST 1 VIEW   COMPARISON:  CXR 09/05/21   FINDINGS: Status post median sternotomy and CABG. Cardiomegaly pleural effusion. No pneumothorax redemonstrated flattening of the bilateral hemidiaphragms. No focal airspace opacity. No radiographically apparent displaced rib fractures. Visualized upper abdomen is unremarkable.   IMPRESSION: 1.  No focal airspace opacity.   2.  Cardiomegaly.     Electronically Signed   By: Lorenza Cambridge M.D.   On: 12/15/2022 12:42   Labs:  Basic Metabolic Panel: Recent Labs  Lab 12/14/22 0435 12/16/22 0502  NA 136 135  K 4.9 4.5  CL 107 108  CO2 19* 21*  GLUCOSE 91 90  BUN 40* 37*  CREATININE 1.49* 1.18  CALCIUM 8.9 8.8*    CBC: Recent Labs  Lab 12/14/22 0435 12/16/22 0502  WBC 11.5* 9.2  NEUTROABS  --  5.6  HGB 9.8* 9.7*  HCT 30.3* 30.1*  MCV 91.5 91.2  PLT 255 254    CBG: No results for input(s): "GLUCAP" in the last 168 hours.   Brief HPI:   Eric [name]. is a 89 y.o. male presented to the ED on 11/30/2022 complaining of left hip pain. He evidently got up from a chair, felt left hip pain and lowered himself to the floor. No other injury or LOC. He is s/p left BKA in 2019 and does use a prosthesis.  X-ray of patient's left hip shows  positive fracture at the left greater trochanter.  Discussed with Dr. Victorino Dike from orthopedics and he saw the patient consultation.  Admitted to the medicine service. He is on Eliquis for atrial fibrillation. This was initially held for possible surgical intervention. Oral anti-hypotensives continued. Found to have AKI atop CKD stage III. Improved with hydration. PMH significant for chronic anemia, CAD, OSA, PVD, hyperlipidemia. DM-2 diet controlled.  UA positive for GNR and started on Zosyn today. Plan is to convert to Cipro. Follow-up culture results.  Able to stand from recliner with min assist, very little WB through LLE while wearing prosthesis. Min assist with small pivotal steps to bed, requires extra time but gradually putting more pressure through LLE. Good control with descent.   Hospital Course: Eric [name]. was admitted to rehab 12/04/2022 for inpatient therapies to consist of PT, ST and OT at least three hours five days a week. Past admission physiatrist, therapy team and rehab RN have worked together to provide customized collaborative inpatient rehab. Follow-up labs on 4/27: Ns 133, BUN/Cr 1.34/28. Slightly worsening BUN/Cr and improvement in Na on 4/28. Urinary retention requiring in and out cath 4/28 and Flomax change to q supper. Proscar continues.  Gross hematuria noted. Hgb down from 9.9 to 9.3. Constipation improved. Continued to encourage PO liquids as Cr up slightly to 1.4. WBC continued downward trend>>9.7 on 4/29, back up to 12.9 on 4/30. Now on oral Cipro for UTI. Follow up by [facility] on 5/01 and adjusted left LE  prosthesis. Continued to require intermittent caths. Neuropsychology eval on 5/03. Patient and son educated on in and out catheterization. Urecholine added. Instructed to double void. Still requiring pain medication for left hip pain with therapies.    Blood pressures were monitored on TID basis and  amlodipine 10 mg daily, atenolol 25 mg daily, hydralazine 75 mg BID  continued. Atenolol decreased to 12.5 mg daily on 5/05 due to soft BP and mild bradycardia.  Diabetes has been monitored with ac/hs CBG checks and SSI was use prn for tighter BS control. Reduced CBGs to BID. Not requiring coverage therefore SSI discontinued 5/01.   Rehab course: During patient's stay in rehab weekly team conferences were held to monitor patient's progress, set goals and discuss barriers to discharge. At admission, patient required mod assist with mobility,  max with LB basic self-care skills.  Physical exam.  Blood pressure 124/55 pulse 82 temperature 98.6 respirations 18 oxygen saturation is 92% room air Constitutional.  No acute distress HEENT Head.  Normocephalic and atraumatic Eyes.  Pupils round and reactive to light no discharge without nystagmus Neck.  Supple nontender no JVD without thyromegaly Cardiac regular rate and rhythm without any extra sounds or murmur heard Abdomen.  Soft nontender positive bowel sounds without rebound Respiratory effort normal no respiratory distress without wheeze Neurologic.  Alert oriented x 3 normal insight and awareness    He has had improvement in activity tolerance, balance, postural control as well as ability to compensate for deficits. He has had improvement in functional use RUE/LUE  and RLE/LLE as well as improvement in awareness. Walked 50 feet X 2 with PT on 5/06. Walked 153 feet on 5/08. He will have home health through [facility] for PT, OT and RN.       Disposition: Discharge to home    Diet: heart healthy/carb modified  Special Instructions: No driving, alcohol consumption or tobacco use.  Weightbearing as tolerated left lower extremity  Medications at discharge 1.  Tylenol as needed 2.  Norvasc 10 mg p.o. daily 3.  Eliquis 2.5 mg p.o. twice daily 4.  Tenormin 12.5 mg p.o. daily 5.  Urecholine 5 mg p.o. 3 times daily 6.  Vitamin B12 500 mcg p.o. daily 7.  Ferrous sulfate 325 mg p.o. daily 8.  Proscar 5 mg  p.o. daily 9.  Hydralazine 75 mg p.o. twice daily 10.  Hydrocodone 5-325 mg 1 tablet every 6 hours as needed pain 11.  Robaxin 500 mg p.o. every 8 hours as needed 12.  Trental 400 mg p.o. every 12 hours 13.  MiraLAX daily hold for loose stools 14.  Crestor 40 mg p.o. daily 15.  Restoril 7.5 mg p.o. nightly as needed 16.  Flomax 0.8 mg p.o. daily   30-35 minutes were spent on discharge planning and discharge summary. Discharge Instructions     Ambulatory referral to Physical Medicine Rehab   Complete by: As directed    Ambulatory referral to Physical Medicine Rehab   Complete by: As directed    Hospital follow-up        Follow-up Information     [name], MD Follow up.   Specialty: Family Medicine Why: Call in 1-2 days to make arrangements for hospital follow-up appointment. Contact information: [address] [place] [address] [phone]         Pricilla Riffle, MD. Go to.   Specialty: Cardiology Contact information: [address] [place] [address] [phone]  Toni Arthurs, MD Follow up.   Specialty: Orthopedic Surgery Why: Call in 1-2 days to make arrangements for hospital follow-up appointment. Contact information: [address] [place] [address] [phone]         Genice Rouge, MD Follow up.   Specialty: Physical Medicine and Rehabilitation Why: office will call you to arrange your appt (sent) Contact information: [address] [place] [address] [phone]                 Signed: Mcarthur Rossetti Vi Biddinger 12/18/2022, 5:08 AM

## 2022-12-07 NOTE — Progress Notes (Signed)
Occupational Therapy Session Note  Patient Details  Name: Eric [name]. MRN: [id number] Date of Birth: [birth year]  {CHL IP REHAB OT TIME CALCULATIONS:[id number]}   Short Term Goals: Week 1:  OT Short Term Goal 1 (Week 1): Pt will be able to ambulate to bathroom with RW with CGA. OT Short Term Goal 2 (Week 1): Pt will complete stand pivot transfer wc to toilet with Supervision with RW. OT Short Term Goal 3 (Week 1): Pt will be able to don pants over feet with CGA. OT Short Term Goal 4 (Week 1): Pt will be able to pull pants over hips with CGA.  Skilled Therapeutic Interventions/Progress Updates:  Pt received *** for skilled OT session with focus on ***. Pt agreeable to interventions, demonstrating overall *** mood. Pt reported ***/10 pain, stating "***" in reference to ***. OT offering intermediate rest breaks and positioning suggestions throughout session to address pain/fatigue and maximize participation/safety in session.    Pt remained *** with all immediate needs met at end of session. Pt continues to be appropriate for skilled OT intervention to promote further functional independence.   Therapy Documentation Precautions:  Precautions Precautions: Fall, Other (comment) Precaution Comments: h/o L BKA (has prosthetic), L LE WBAT, no active hip abduction Restrictions Weight Bearing Restrictions: No LLE Weight Bearing: Weight bearing as tolerated Other Position/Activity Restrictions: no active hip abduction   Therapy/Group: Individual Therapy  Lou Cal, OTR/L, MSOT  12/07/2022, 10:51 PM

## 2022-12-08 DIAGNOSIS — S72102D Unspecified trochanteric fracture of left femur, subsequent encounter for closed fracture with routine healing: Secondary | ICD-10-CM | POA: Diagnosis not present

## 2022-12-08 LAB — BASIC METABOLIC PANEL
Anion gap: 9 (ref 5–15)
BUN: 28 mg/dL — ABNORMAL HIGH (ref 8–23)
CO2: 22 mmol/L (ref 22–32)
Calcium: 8.8 mg/dL — ABNORMAL LOW (ref 8.9–10.3)
Chloride: 104 mmol/L (ref 98–111)
Creatinine, Ser: 1.43 mg/dL — ABNORMAL HIGH (ref 0.61–1.24)
GFR, Estimated: 46 mL/min — ABNORMAL LOW (ref >60)
Glucose, Bld: 97 mg/dL (ref 70–99)
Potassium: 4.4 mmol/L (ref 3.5–5.1)
Sodium: 135 mmol/L (ref 135–145)

## 2022-12-08 LAB — CBC WITH DIFFERENTIAL/PLATELET
Abs Immature Granulocytes: 0.05 10*3/uL (ref 0.00–0.07)
Basophils Absolute: 0.1 10*3/uL (ref 0.0–0.1)
Basophils Relative: 1 %
Eosinophils Absolute: 0.3 10*3/uL (ref 0.0–0.5)
Eosinophils Relative: 2 %
HCT: 32.2 % — ABNORMAL LOW (ref 39.0–52.0)
Hemoglobin: 10.1 g/dL — ABNORMAL LOW (ref 13.0–17.0)
Immature Granulocytes: 0 %
Lymphocytes Relative: 34 %
Lymphs Abs: 4.3 10*3/uL — ABNORMAL HIGH (ref 0.7–4.0)
MCH: 29.3 pg (ref 26.0–34.0)
MCHC: 31.4 g/dL (ref 30.0–36.0)
MCV: 93.3 fL (ref 80.0–100.0)
Monocytes Absolute: 0.9 10*3/uL (ref 0.1–1.0)
Monocytes Relative: 7 %
Neutro Abs: 7.3 10*3/uL (ref 1.7–7.7)
Neutrophils Relative %: 56 %
Platelets: 244 10*3/uL (ref 150–400)
RBC: 3.45 MIL/uL — ABNORMAL LOW (ref 4.22–5.81)
RDW: 16.1 % — ABNORMAL HIGH (ref 11.5–15.5)
WBC: 12.9 10*3/uL — ABNORMAL HIGH (ref 4.0–10.5)
nRBC: 0 % (ref 0.0–0.2)

## 2022-12-08 LAB — GLUCOSE, CAPILLARY
Glucose-Capillary: 110 mg/dL — ABNORMAL HIGH (ref 70–99)
Glucose-Capillary: 107 mg/dL — ABNORMAL HIGH (ref 70–99)
Glucose-Capillary: 113 mg/dL — ABNORMAL HIGH (ref 70–99)

## 2022-12-08 MED ORDER — CIPROFLOXACIN HCL 250 MG PO TABS
250.0000 mg | ORAL_TABLET | Freq: Two times a day (BID) | ORAL | Status: AC
  Administered 2022-12-08 – 2022-12-11 (×8): 250 mg via ORAL
  Filled 2022-12-08 (×8): qty 1

## 2022-12-08 NOTE — Patient Care Conference (Signed)
Inpatient RehabilitationTeam Conference and Plan of Care Update Date: 12/08/2022   Time: 11:52 AM   Patient Name: Eric [name].      Medical Record Number: [id number]  Date of Birth: 01-04-1928 Sex: Male         Room/Bed: 4W01C/4W01C-01 Payor Info: Payor: [employer] / Plan: [employer] PPO / Product Type: *No Product type* /    Admit Date/Time:  12/04/2022  5:19 PM  Primary Diagnosis:  Greater trochanter fracture [facility])  Hospital Problems: Principal Problem:   Greater trochanter fracture (HCC) Active Problems:   Traumatic closed trochanteric fracture of femur with minimal displacement with routine healing, left    Expected Discharge Date: Expected Discharge Date: 12/18/22  Team Members Present: Physician leading conference: Dr. Genice Rouge Social Worker Present: Cecile Sheerer, LCSWA Nurse Present: Vedia Pereyra, RN PT Present: [name], PT OT Present: Other (comment) Lou Cal, OT) PPS Coordinator present : Fae Pippin, SLP     Current Status/Progress Goal Weekly Team Focus  Bowel/Bladder   continent of b/b; LBM: 4/29: Q6hr PVR i/o <344ml   continue empting bladder   assist with toileting needs PRN    Swallow/Nutrition/ Hydration               ADL's   Setup UB ADLs, Min A LB ADLs & toileting   Supervision   Activity tolerance, general conditioning, and AE as appropriate    Mobility   mod/max A bed mobility , CGA STS & stand pivot using RW , CGA gait up to 50 ft using RW +2 w/c follow, supervision 178ft w/c propulsion   supervsion  prosthesis fitting, bed mobility, transfers, gait, pain management, pt/fam education    Communication                Safety/Cognition/ Behavioral Observations               Pain   c/o pain to L hip; PRN norco   pain level <6/10   assess pain QS and prn    Skin   abrasion RUE, foam in place   maintain skin integrity  assess skin QS and prn      Discharge Planning:   Pt will d/c to home with support from wife. One of their children will be at the home daily. SW will confirm there are no barriers to discharge.   Team Discussion: Greater trochanter fracture. Encourage to get up to [facility] to empty bladder, uses massage to assist.  UTI treatment continues. No gait at this time. Encouraging use of PRN medications for pain. Contact for ESBL in urine this admission.  Encouraging AE to assist with ADLs.  Patient on target to meet rehab goals: Therapies have been limited due to prosthesis not fitting properly.   *See Care Plan and progress notes for long and short-term goals.   Revisions to Treatment Plan:   [facility] contacted due to prothesis not fitting properly. Teaching Needs: Medications, safety, gait/transfer training, self care, skin care, etc.   Current Barriers to Discharge: Decreased caregiver support and Neurogenic bowel and bladder  Possible Resolutions to Barriers: Family education, bladder retraining, order recommended DME     Medical Summary Current Status: urinary retention better for last 24 hours-  no caths- doing better- urine now clear from bloody - Hb up to 10.1 so doing better;  Barriers to Discharge: Medical stability;Neurogenic Bowel & Bladder;Self-care education;Weight bearing restrictions;Uncontrolled Pain;Inadequate Nutritional Intake;Other (comments)  Barriers to Discharge Comments: hematuria better so far; more pain-  needs foam on nub - has redness- pt doesn't realize that prosthesis rubbing-   reached out to [facility]- prosthesis ill fitting; Possible Resolutions to [employer] Focus: [facility] to come work on prosthesis; needs to take prosthesis off to sit or sit up in bed; we need him to summarize to make sure we know what's going on- 5/10   Continued Need for Acute Rehabilitation Level of Care: The patient requires daily medical management by a physician with specialized training in physical medicine and rehabilitation for the  following reasons: Direction of a multidisciplinary physical rehabilitation program to maximize functional independence : Yes Medical management of patient stability for increased activity during participation in an intensive rehabilitation regime.: Yes Analysis of laboratory values and/or radiology reports with any subsequent need for medication adjustment and/or medical intervention. : Yes   I attest that I was present, lead the team conference, and concur with the assessment and plan of the team.   Jearld Adjutant 12/08/2022, 6:23 PM

## 2022-12-08 NOTE — Progress Notes (Signed)
Occupational Therapy Session Note  Patient Details  Name: Eric [name]. MRN: [id number] Date of Birth: Feb 28, 1928  Today's Date: 12/08/2022 OT Individual Time: 1300-1345 OT Individual Time Calculation (min): 45 min    Short Term Goals: Week 1:  OT Short Term Goal 1 (Week 1): Pt will be able to ambulate to bathroom with RW with CGA. OT Short Term Goal 2 (Week 1): Pt will complete stand pivot transfer wc to toilet with Supervision with RW. OT Short Term Goal 3 (Week 1): Pt will be able to don pants over feet with CGA. OT Short Term Goal 4 (Week 1): Pt will be able to pull pants over hips with CGA. Week 2:     Skilled Therapeutic Interventions/Progress Updates:    1:1 Self care retraining. Pt received on the [facility] and was having trouble getting up off the commode with nursing. Discussed transfer plans. Pt decided to don prosthesis to stand- pt not fully weight bearing through LE. Pt able to come into standing with min A. Clothing management with total A while needed bilateral UE support on RW. Pt then ambulated !~ 5 feet to the EOB. Assisted with shaving sitting EOB. Pt then transferred with RW and prosthesis to recliner with min A and then prosthesis doffed and limb shrinker donned and elevated.   Left sitting up with call bell.  Therapy Documentation Precautions:  Precautions Precautions: Fall, Other (comment) Precaution Comments: h/o L BKA (has prosthetic), L LE WBAT, no active hip abduction Restrictions Weight Bearing Restrictions: No LLE Weight Bearing: Weight bearing as tolerated Other Position/Activity Restrictions: no active hip abduction  Pain: Ongoing pain in left hip  Therapy/Group: Individual Therapy  Roney Mans [facility] 12/08/2022, 3:35 PM

## 2022-12-08 NOTE — Progress Notes (Signed)
Physical Therapy Session Note  Patient Details  Name: Eric [name]. MRN: [id number] Date of Birth: 1927/11/26  Today's Date: 12/08/2022 PT Individual Time: 0900-0950 PT Individual Time Calculation (min): 50 min   Short Term Goals: Week 1:  PT Short Term Goal 1 (Week 1): Pt will perform supine<>sit with no more than CGA PT Short Term Goal 2 (Week 1): Pt will perform sit<>stands using LRAD with no more than CGA PT Short Term Goal 3 (Week 1): Pt will perform bed<>chair transfers using LRAD with CGA PT Short Term Goal 4 (Week 1): Pt will ambulate at least 74ft using LRAD with CGA  Skilled Therapeutic Interventions/Progress Updates:      Therapy Documentation Precautions:  Precautions Precautions: Fall, Other (comment) Precaution Comments: h/o L BKA (has prosthetic), L LE WBAT, no active hip abduction Restrictions Weight Bearing Restrictions: No LLE Weight Bearing: Weight bearing as tolerated Other Position/Activity Restrictions: no active hip abduction   Pt received seated in w/c at bedside and reports 7-8/10 left hip pain at rest. Pt reports he received pain medication ~10 minutes prior to session but he has yet to feel relief. PT had pt remove prosthesis and assessed skin and pt with redness to left residual limb lateral to tibial tuberosity. PT performed wound care and applied foam dressing to affected area. Pt also with increased edema to area and PT applied shrinker to assist. Pt with residual limb down positioned downward while sitting in wheelchair and PT provided elevating via amputee board and padded towels to address edema. Pt left seated in w/c at bedside with chair alarm on and all needs within reach.   Therapy/Group: Individual Therapy  [name] [name] PT, DPT  12/08/2022, 7:56 AM

## 2022-12-08 NOTE — Progress Notes (Signed)
Occupational Therapy Session Note  Patient Details  Name: Eric [name]. MRN: [id number] Date of Birth: 1928-05-15  Today's Date: 12/08/2022 OT Individual Time: 1435-1530 OT Individual Time Calculation (min): 55 min   Short Term Goals: Week 1:  OT Short Term Goal 1 (Week 1): Pt will be able to ambulate to bathroom with RW with CGA. OT Short Term Goal 2 (Week 1): Pt will complete stand pivot transfer wc to toilet with Supervision with RW. OT Short Term Goal 3 (Week 1): Pt will be able to don pants over feet with CGA. OT Short Term Goal 4 (Week 1): Pt will be able to pull pants over hips with CGA.  Skilled Therapeutic Interventions/Progress Updates:     Pt received sitting up in recliner presenting to be mildly uncomfortable reporting 3/10 pain- OT offering intermittent rest breaks, repositioning, and therapeutic support maximal participation in session. Pt reporting he received pain medications prior to session. Engaged Pt in lighthearted conversation discussing d/c plans and meaningful activities he participated in prior to hospitalization including attending church and going out to eat with noted improvement in moral. Pt reporting he will be d/c'ing home with his wife and his son and daughter will be available to assist as needed. Discussed medication management with Pt receptive to using pill box upon d/c and having family member check over medications to ensure correct dosage is taken at correct time of day. Attempted to engage Pt in simulate medication management task, however unable to read medication bottles without his glasses. Encouraged Pt to have family members bring in glasses to allow optimal participation in sessions with Pt receptive. Offered to assist Pt into wc and to gym or outdoor location for remainder of therapy session, however Pt requesting to stay in his room for entirety of session to prevent increased hip pain and fatigue. Pt receptive to completing UB exercises in room  seated in chair. Guided Pt through completing UB exercises using green (moderate resistance) therband seated in recliner with RLE on floor to facilitate wearing bearing through RLE and improved postural alignment. Pt completed 2x10 reps of the following exercises with skilled feedback provided for technique, body mechanics, and breathing with movements:  -Horizontal chest pulls (shoulder retraction) -PNF diagonal pulls R/L -Bicep curls R/L -Tricep extensions R/L -Overhead lateral pulls Pt reporting no pain following. Guided Pt through gentle neck and arm stretches to prevent soreness with Pt able to complete each stretch with OT modeling movement. Pt left resting in recliner, with call bell in reach, seat belt alarm on, and all needs met.   Therapy Documentation Precautions:  Precautions Precautions: Fall, Other (comment) Precaution Comments: h/o L BKA (has prosthetic), L LE WBAT, no active hip abduction Restrictions Weight Bearing Restrictions: No LLE Weight Bearing: Weight bearing as tolerated Other Position/Activity Restrictions: no active hip abduction  Therapy/Group: Individual Therapy  Eric [name] 12/08/2022, 7:55 AM

## 2022-12-08 NOTE — Progress Notes (Signed)
PROGRESS NOTE   Subjective/Complaints:   Pt reports didn't require a cath in last 24 hours. Voiding well- urine clear per pt/and nursing.  LBM yesterday Walked well yesterday AM, but hurting more in PM, so didn't walk well.   Hurting on numb of L BKA, doesn't know why- doesn't think has skin breakdown- wearing L BKA prosthesis.   ROS:   Pt denies SOB, abd pain, CP, N/V/C/D, and vision changes  Except for HPI  Objective:   No results found. Recent Labs    12/07/22 0545 12/08/22 0652  WBC 9.7 12.9*  HGB 9.3* 10.1*  HCT 28.8* 32.2*  PLT 197 244   Recent Labs    12/07/22 0545 12/08/22 0652  NA 134* 135  K 4.3 4.4  CL 104 104  CO2 24 22  GLUCOSE 85 97  BUN 26* 28*  CREATININE 1.42* 1.43*  CALCIUM 8.2* 8.8*    Intake/Output Summary (Last 24 hours) at 12/08/2022 0851 Last data filed at 12/08/2022 0736 Gross per 24 hour  Intake 928 ml  Output 875 ml  Net 53 ml        Physical Exam: Vital Signs Blood pressure (!) 143/81, pulse 61, temperature 98 F (36.7 C), resp. rate 17, height 5\' 5"  (1.651 m), weight 76.6 kg, SpO2 98 %.       General: awake, alert, appropriate,  sitting up in bedside chair; eating breakfast; NAD HENT: conjugate gaze; oropharynx moist CV: regular to borderline bradycardic  rate; no JVD Pulmonary: CTA B/L; no W/R/R- good air movement GI: soft, NT, ND, (+)BS- more normoactive Psychiatric: appropriate Neurological: Ox3  Musculoskeletal: wearing L BKA prosthesis     Cervical back: Normal range of motion.     Comments: Mild swelling LLE. Left hip with significant tenderness with movement and palpation.  No improvement so far TTP around L hip where has fx.  Skin:    General: Skin is warm and dry.  Neurological:     Mental Status: He is alert.     Comments: Alert and oriented x 3. Normal insight and awareness. Intact Memory. Normal language and speech. Cranial nerve exam  unremarkable. MMT: UE 5/5. RLE 4/5. LLE 3/5 HF, KE d/t pain. Sensory exam normal for light touch and pain in all 4 limbs. No limb ataxia or cerebellar signs. No abnormal tone appreciated.  .     Assessment/Plan: 1. Functional deficits which require 3+ hours per day of interdisciplinary therapy in a comprehensive inpatient rehab setting. Physiatrist is providing close team supervision and 24 hour management of active medical problems listed below. Physiatrist and rehab team continue to assess barriers to discharge/monitor patient progress toward functional and medical goals  Care Tool:  Bathing    Body parts bathed by patient: Right arm, Left arm, Chest, Abdomen, Front perineal area, Right upper leg, Left upper leg   Body parts bathed by helper: Left lower leg, Right lower leg, Buttocks     Bathing assist Assist Level: Moderate Assistance - Patient 50 - 74%     Upper Body Dressing/Undressing Upper body dressing   What is the patient wearing?: Pull over shirt    Upper body assist Assist Level: Set up  assist    Lower Body Dressing/Undressing Lower body dressing      What is the patient wearing?: Incontinence brief, Pants     Lower body assist Assist for lower body dressing: Maximal Assistance - Patient 25 - 49%     Toileting Toileting    Toileting assist Assist for toileting: Maximal Assistance - Patient 25 - 49%     Transfers Chair/bed transfer  Transfers assist  Chair/bed transfer activity did not occur: Safety/medical concerns  Chair/bed transfer assist level: Moderate Assistance - Patient 50 - 74% Chair/bed transfer assistive device: Walker, Armrests   Locomotion Ambulation   Ambulation assist   Ambulation activity did not occur: Safety/medical concerns          Walk 10 feet activity   Assist  Walk 10 feet activity did not occur: Safety/medical concerns        Walk 50 feet activity   Assist Walk 50 feet with 2 turns activity did not occur:  Safety/medical concerns         Walk 150 feet activity   Assist Walk 150 feet activity did not occur: Safety/medical concerns         Walk 10 feet on uneven surface  activity   Assist Walk 10 feet on uneven surfaces activity did not occur: Safety/medical concerns         Wheelchair     Assist Is the patient using a wheelchair?: Yes Type of Wheelchair: Manual    Wheelchair assist level: Supervision/Verbal cueing Max wheelchair distance: 119ft    Wheelchair 50 feet with 2 turns activity    Assist        Assist Level: Supervision/Verbal cueing   Wheelchair 150 feet activity     Assist      Assist Level: Supervision/Verbal cueing   Blood pressure (!) 143/81, pulse 61, temperature 98 F (36.7 C), resp. rate 17, height 5\' 5"  (1.651 m), weight 76.6 kg, SpO2 98 %.  Medical Problem List and Plan: 1. Functional deficits secondary to left greater trochanter fracture             -pt with prior left BKA and prosthesis- incorporate donning/doffing, use of prosthetic into therapy plan/goals. Socket a little tighter than usual due to sl swelling in LLE from fracture and not wearing prosthesis as much.             -patient may  shower             -ELOS/Goals: 10 days, mod I to supervision with PT, OT   Con't CIR PT and OT  Team conference today to determine length of stay 2.  Antithrombotics: -DVT/anticoagulation:  Pharmaceutical: Eliquis             -antiplatelet therapy:    3. Pain Management: Tylenol as needed for mild pain             -hydrocodone prn for severe pain             -robaxin prn for spasms             -prn ice   4/27- pain usually controlled per pt- con't regimen- see how he does in therapies  4/30- Pain this AM- waiting for pain meds-  4. Mood/Behavior/Sleep: LCSW to evaluate and provide emotional support             -antipsychotic agents: n/a   5. Neuropsych/cognition: This patient is capable of making decisions on his own behalf.    6. Skin/Wound  Care: Routine skin care checks   7. Fluids/Electrolytes/Nutrition: Routine Is and Os and follow-up chemistries             -continue vitamin B12 supplements, oral irom   8: Hypertension: monitor TID and prn (lasix 40 mg not restarted)             -continue amlodipine 10 mg daily             -continue atenolol 25 mg daily             -continue hydralazine 75 mg BID   4/27- BP somewhat elevated in 160s this Am systolic- usually 140s systolic- will monitor for trend for another 24 hours before changing meds  4/28-4/29 BP doing better- con't regimen 9: Hyperlipidemia: continue statin   10: Atrial fibrillation, chronic: rate controlled; continue Eliquis, BB   11: CAD: stable; s/p CABG 2000; balloon angioplasty 2003             -follows with Dr. Tenny Craw   12: OSA:    13: Anemia: continue oral iron; follow-up CBC   14: Left greater trochanter displacement; non-opertive management             -WBAT   15: Urinary retention/BPH: continue Proscar and Flomax             -monitor output/PVR's             -OOB to void when possible  4/28- Changed Flomax to q supper- also on Proscar- having bleeding form penis- could be why had blood clots- UTI can explain, but will recheck labs in AM to make sure Hb stable- so far, is 9.9 and stable from prior days. 4/29-  Hb has dropped some 9.3- will recheck in Am and if drops again, will call Urology.   4/30- Hb up to 10.1 and urine now clear 16: DM-2: CBGs QID; carb modified diet; A1c 4.8% on 4/08 (diet controlled at home)             -on SSI but not requiring coverage   4/27- CBG's- 98-171- usually below 130- will reduce CBGs to BID-AC since doing well and A1c is 4.8 17: AKI atop CKD 3: baseline creatinine ~1.3 ??             -follow-up BMP  4/27- will recheck Sunday AM and con't to check weekly   4/28- Cr up slightly to 1.41 from 1.34- baseline Cr 1.3- pt drinking ~ 3 cups/water /day- asked him to drink 6-8 cups/day and will recheck in  AM  4/29- Cr stable at 1.4- will con't topush fluids by [facility] Thursday  4/30- will recheck in AM since BUN/Cr slightly rising.  18: PAD: s/p left BKA 2019 Dr. Wyn Quaker             -continue Trental   19: Leukocytosis: afebrile; on Zosyn for UTI, started today 4/26             -follow-up sensitives -follow-up CBC 4/27- will recheck in AM/Sunday - is afebrile 4/28- WBC down to 10.5 from 12.4 4/29- WBC down to 9.7k 4/30- WBC back up to 12.9- likely because didn't get Cipro treatment as recommended- will recheck in AM 21: Pseudomonas (and E Coli) UTI             -continue Zosyn iv x 3 days at least then po cipro x 3 more days per [facility]  4/29- changing to Cipro today- having blood clots and sediment in urine- likely due  to UTI  4/30- Cipro didn't start yesterday when Zosyn stopped for some reason- will start- since WBC up to 12.9- -will recheck labs in AM   22. Urinary retention  4/28- started Flomax- needed in/out cath due to retention- likely due to UTI and constipation  4/29- peed this Am; required cath last night-overnight- still has blood clots- per documentation. But voiding better  4/30- no cath since yesterday am and pt reports not bloody when pees.  23. Constipation  4/28- pt has stool ball, it sounds like- trying to break up- also ordered Sorbitol 45cc this AM- and asked nursing to do digital disimpaction if need be.   4/29- LBM yesterday x2- feels better- will increase bowel meds- add Senokot-S 1 tab BID  4/30- LBM yesterday   I spent a total of 51    minutes on total care today- >50% coordination of care- due to  D/w pt about urination and cathing- hasn't needed in 24 hours- however d/w nursing and pharmacy about restarting Cipro-  and review of labs today and comparison. Also team conference to determine length of stay  LOS: 4 days A FACE TO FACE EVALUATION WAS PERFORMED  Kristopher Delk 12/08/2022, 8:51 AM

## 2022-12-08 NOTE — Progress Notes (Signed)
Patient ID: Eric Rengel., male   DOB: 1927-12-26, 89 y.o.   MRN: [id number]  This SW covering for primary SW, Becky Dupree.   SW met with pt in room to provide updates from team conference, and d/c date 5/10. He is aware there will continue to be updates, and SW will follow-up with his son Eric [name].   1600-SW spoke with pt son Eric [name] to provide updates from team conference, and d/c date 5/10. SW discussed family edu later this week or early next week. He will follow-up once he confirms a time he and two brothers can come in.   Cecile Sheerer, MSW, LCSWA Office: [phone] Cell: [phone] Fax: [phone]

## 2022-12-09 DIAGNOSIS — S72109A Unspecified trochanteric fracture of unspecified femur, initial encounter for closed fracture: Secondary | ICD-10-CM | POA: Diagnosis not present

## 2022-12-09 LAB — CBC WITH DIFFERENTIAL/PLATELET
Abs Immature Granulocytes: 0.04 10*3/uL (ref 0.00–0.07)
Basophils Absolute: 0.1 10*3/uL (ref 0.0–0.1)
Basophils Relative: 1 %
Eosinophils Absolute: 0.2 10*3/uL (ref 0.0–0.5)
Eosinophils Relative: 2 %
HCT: 29.8 % — ABNORMAL LOW (ref 39.0–52.0)
Hemoglobin: 9.4 g/dL — ABNORMAL LOW (ref 13.0–17.0)
Immature Granulocytes: 0 %
Lymphocytes Relative: 33 %
Lymphs Abs: 3.6 10*3/uL (ref 0.7–4.0)
MCH: 28.9 pg (ref 26.0–34.0)
MCHC: 31.5 g/dL (ref 30.0–36.0)
MCV: 91.7 fL (ref 80.0–100.0)
Monocytes Absolute: 0.8 10*3/uL (ref 0.1–1.0)
Monocytes Relative: 7 %
Neutro Abs: 6 10*3/uL (ref 1.7–7.7)
Neutrophils Relative %: 57 %
Platelets: 232 10*3/uL (ref 150–400)
RBC: 3.25 MIL/uL — ABNORMAL LOW (ref 4.22–5.81)
RDW: 16.1 % — ABNORMAL HIGH (ref 11.5–15.5)
WBC: 10.7 10*3/uL — ABNORMAL HIGH (ref 4.0–10.5)
nRBC: 0 % (ref 0.0–0.2)

## 2022-12-09 LAB — BASIC METABOLIC PANEL
Anion gap: 5 (ref 5–15)
BUN: 35 mg/dL — ABNORMAL HIGH (ref 8–23)
CO2: 23 mmol/L (ref 22–32)
Calcium: 8.5 mg/dL — ABNORMAL LOW (ref 8.9–10.3)
Chloride: 107 mmol/L (ref 98–111)
Creatinine, Ser: 1.33 mg/dL — ABNORMAL HIGH (ref 0.61–1.24)
GFR, Estimated: 50 mL/min — ABNORMAL LOW (ref >60)
Glucose, Bld: 88 mg/dL (ref 70–99)
Potassium: 4.2 mmol/L (ref 3.5–5.1)
Sodium: 135 mmol/L (ref 135–145)

## 2022-12-09 LAB — GLUCOSE, CAPILLARY
Glucose-Capillary: 87 mg/dL (ref 70–99)
Glucose-Capillary: 121 mg/dL — ABNORMAL HIGH (ref 70–99)
Glucose-Capillary: 112 mg/dL — ABNORMAL HIGH (ref 70–99)

## 2022-12-09 NOTE — Progress Notes (Signed)
Physical Therapy Session Note  Patient Details  Name: Eric [name]. MRN: [id number] Date of Birth: [birth year]  Today's Date: 12/09/2022 PT Individual Time: 0919-1000 PT Individual Time Calculation (min): 41 min   Short Term Goals: Week 1:  PT Short Term Goal 1 (Week 1): Pt will perform supine<>sit with no more than CGA PT Short Term Goal 2 (Week 1): Pt will perform sit<>stands using LRAD with no more than CGA PT Short Term Goal 3 (Week 1): Pt will perform bed<>chair transfers using LRAD with CGA PT Short Term Goal 4 (Week 1): Pt will ambulate at least 35ft using LRAD with CGA  Skilled Therapeutic Interventions/Progress Updates:      Therapy Documentation Precautions:  Precautions Precautions: Fall, Other (comment) Precaution Comments: h/o L BKA (has prosthetic), L LE WBAT, no active hip abduction Restrictions Weight Bearing Restrictions: No LLE Weight Bearing: Weight bearing as tolerated Other Position/Activity Restrictions: no active hip abduction  Pt received seated in recliner at bedside with prosthesis donned. PT removed prosthesis and inspected skin with patient's nurse. Pt without red spot and skin intact. Pt encouraged to only wear prosthesis for transfers and to not walk until prosthesis inspected by [employer]. Pt agreeable to plan. Pt reports 3-4/10 L hip pain, pre-medicated and provided repositioning for relief. Pt performed left straight leg raises 3 x 10 to address quad strength deficits. PT passively performed 3 x 1 min hamstring stretches as pt reports right LE "tigthness". Pt concluded session with 1 x 10 of chair push up's to increase triceps strength to better transfers. Safety plan discussed with nursing and pt left semi-reclined in recliner with all needs in reach and chair alarm on.    Therapy/Group: Individual Therapy  [name] [name] PT, DPT  12/09/2022, 7:42 AM

## 2022-12-09 NOTE — Progress Notes (Signed)
Inpatient Rehabilitation Care Coordinator Assessment and Plan Patient Details  Name: Eric [name]. MRN: [id number] Date of Birth: [birth year]  Today's Date: 12/09/2022  Hospital Problems: Principal Problem:   Greater trochanter fracture [facility]) Active Problems:   Traumatic closed trochanteric fracture of femur with minimal displacement with routine healing, left  Past Medical History:  Past Medical History:  Diagnosis Date   Atrial flutter (HCC)    s/p RFCA   CAD (coronary artery disease)    cath 2003, occluded S-RCA, occluded S-Dx, L-LAD ok, s/p PTCA to LAD   Cataract    CKD (chronic kidney disease) stage 3, GFR 30-59 ml/min (HCC)    Diabetes mellitus    diet controlled   Hearing aid worn    bilateral   History of kidney stones    Long term (current) use of anticoagulants    Neuromuscular disorder (HCC)    OSA (obstructive sleep apnea) 12/11   very mild, AHI 7/hr   Persistent atrial fibrillation (HCC) 09/26/2015   Pure hyperglyceridemia    PVD (peripheral vascular disease) (HCC)    angioplasty of his right lower extremity in [place] by Dr.[name] 2013   Unspecified essential hypertension    Wears dentures    full upper and lower   Past Surgical History:  Past Surgical History:  Procedure Laterality Date   ABDOMINAL AORTOGRAM W/LOWER EXTREMITY N/A 11/15/2017   Procedure: ABDOMINAL AORTOGRAM W/LOWER EXTREMITY;  Surgeon: Sherren Kerns, MD;  Location: [facility] INVASIVE CV LAB;  Service: Cardiovascular;  Laterality: N/A;   Adenosine Myoview  3/06   EF 56%, neg. Ischemia   Adenosine Myoview  02/18/07   nml   AMPUTATION Left 12/15/2017   Procedure: AMPUTATION BELOW KNEE;  Surgeon: Annice Needy, MD;  Location: [facility] ORS;  Service: Vascular;  Laterality: Left;   ANGIOPLASTY  1/99   CAD- diogonal with rotational artherectomy   Arthrectomy     of LAD & PTCA   BLEPHAROPLASTY Bilateral    CARDIAC CATHETERIZATION  1/00   CARDIOVERSION  1/04   CARDIOVERSION  5/07   hospital- a  flutter   CARPAL TUNNEL RELEASE     ? bilateral   CATARACT EXTRACTION W/PHACO Right 07/28/2017   Procedure: CATARACT EXTRACTION PHACO AND INTRAOCULAR LENS PLACEMENT (IOC) RIGHT DIABETIC;  Surgeon: Lockie Mola, MD;  Location: [facility];  Service: Ophthalmology;  Laterality: Right;  Diabetic - diet controlled   CATARACT EXTRACTION W/PHACO Left 08/18/2017   Procedure: CATARACT EXTRACTION PHACO AND INTRAOCULAR LENS PLACEMENT (IOC);  Surgeon: Lockie Mola, MD;  Location: [facility];  Service: Ophthalmology;  Laterality: Left;  DIABETES - oral meds   COLONOSCOPY W/ BIOPSIES  10/01/06   sigmoid polyp bx neg, 3 years   COLONOSCOPY WITH PROPOFOL N/A 04/28/2021   Procedure: COLONOSCOPY WITH PROPOFOL;  Surgeon: Iva Boop, MD;  Location: [facility] ENDOSCOPY;  Service: Endoscopy;  Laterality: N/A;   CORONARY ANGIOPLASTY  4/03   cutting balloon PTCA pLAD into Diag   CORONARY ARTERY BYPASS GRAFT  2000   LIMA-LAD, SVG-RCA, SVG-Diag; SVG-Diag & SVG-RCA occluded 2003   FRACTURE SURGERY     HAND SURGERY  08/27/09   R thumb procedure wit Scaphoid Gragt and screws, Dr Merlyn Lot   LOWER EXTREMITY ANGIOGRAPHY Right 01/25/2019   Procedure: LOWER EXTREMITY ANGIOGRAPHY;  Surgeon: Renford Dills, MD;  Location: [facility] INVASIVE CV LAB;  Service: Cardiovascular;  Laterality: Right;   NM MYOVIEW LTD  4/11   normal   PERIPHERAL VASCULAR CATHETERIZATION Right 06/27/2015  Procedure: Lower Extremity Angiography;  Surgeon: Annice Needy, MD;  Location: [facility] INVASIVE CV LAB;  Service: Cardiovascular;  Laterality: Right;   PERIPHERAL VASCULAR CATHETERIZATION  06/27/2015   Procedure: Lower Extremity Intervention;  Surgeon: Annice Needy, MD;  Location: [facility] INVASIVE CV LAB;  Service: Cardiovascular;;   POLYPECTOMY  04/28/2021   Procedure: POLYPECTOMY;  Surgeon: Iva Boop, MD;  Location: [facility] ENDOSCOPY;  Service: Endoscopy;;   Social History:  reports that he has never smoked. He quit smokeless  tobacco use about 30 years ago. He reports that he does not drink alcohol and does not use drugs.  Family / Support Systems Marital Status: Married How Long?: 73 years Patient Roles: Spouse, Parent Spouse/Significant Other: Eric [name] (wife) Children: 5 adult children. Eric [name] is main contact. Other Supports: various family members Anticipated Caregiver: All family members will be helping care for patient Ability/Limitations of Caregiver: None reported Caregiver Availability: 24/7 Family Dynamics: Pt lives with his wife, and there is a family member at the home daily.  Social History Preferred language: English Religion: Baptist Cultural Background: Pt worked in Cabin crew until retirement Education: 6th  grade Health Literacy - How often do you need to have someone help you when you read instructions, pamphlets, or other written material from your doctor or pharmacy?: Never Writes: Yes Employment Status: Retired Age Retired: 75 Marine scientist Issues: Denies Guardian/Conservator: No HCPOA   Abuse/Neglect Abuse/Neglect Assessment Can Be Completed: Yes Physical Abuse: Denies Verbal Abuse: Denies Sexual Abuse: Denies Exploitation of patient/patient's resources: Denies Self-Neglect: Denies  Patient response to: Social Isolation - How often do you feel lonely or isolated from those around you?: Never  Emotional Status Pt's affect, behavior and adjustment status: Pt in good spirits at time of visit Recent Psychosocial Issues: Denies Psychiatric History: Denies Substance Abuse History: Denies  Patient / Family Perceptions, Expectations & Goals Pt/Family understanding of illness & functional limitations: Pt and family have a general understanding of pt care needs Premorbid pt/family roles/activities: Independent Anticipated changes in roles/activities/participation: Assistance with ADLs/IADLs Pt/family expectations/goals: pt goal is to "feel safe with walking on  leg and get a little steady"  [employer]: None Premorbid Home Care/DME Agencies: None Transportation available at discharge: TBD Is the patient able to respond to transportation needs?: Yes In the past 12 months, has lack of transportation kept you from medical appointments or from getting medications?: No In the past 12 months, has lack of transportation kept you from meetings, work, or from getting things needed for daily living?: No Resource referrals recommended: Neuropsychology  Discharge Planning Living Arrangements: Spouse/significant other Support Systems: Spouse/significant other, Children, Other relatives Type of Residence: Private residence Insurance Resources: Media planner (specify) (healthteam advantage) Financial Resources: Restaurant manager, fast food Screen Referred: No Living Expenses: Own Money Management: Patient, Spouse Does the patient have any problems obtaining your medications?: No Home Management: Pt reports he manages home care needs, and has assistance from their dtr with meals. states his sons help with running errands such as transportation to grocery store, appointments, etc. Patient/Family Preliminary Plans: TBD Care Coordinator Barriers to Discharge: Insurance for SNF coverage Care Coordinator Anticipated Follow Up Needs: HH/OP  Clinical Impression SW met with pt in room to introduce self, explain role, and discuss discharge process. Pt is not a Cytogeneticist. No HCPOA. DME: quad cane, crutches, w/c, power chair, rollator, RW, and TTB.   Rhealyn Cullen A Morissa Obeirne 12/09/2022, 12:57 PM

## 2022-12-09 NOTE — Progress Notes (Signed)
Occupational Therapy Session Note  Patient Details  Name: Eric [name]. MRN: [id number] Date of Birth: [birth year]  Today's Date: 12/09/2022 OT Individual Time: 1400-1530 OT Individual Time Calculation (min): 90 min    Short Term Goals: Week 1:  OT Short Term Goal 1 (Week 1): Pt will be able to ambulate to bathroom with RW with CGA. OT Short Term Goal 2 (Week 1): Pt will complete stand pivot transfer wc to toilet with Supervision with RW. OT Short Term Goal 3 (Week 1): Pt will be able to don pants over feet with CGA. OT Short Term Goal 4 (Week 1): Pt will be able to pull pants over hips with CGA.  Skilled Therapeutic Interventions/Progress Updates:   Pt seen for skilled OT session with focus on toilet transfer, skin and joint protection and light UE strength via w/c mobility. Pt able to don L LE prosthesis as orthotist came and checked prosthesis and cleared for use. Pt indp for donning after set up. Transfer recliner to and from w/c with CGA with RW via SPT. Toilet transfer with min A clothing mngt and SPT with RW for voiding. Pt used hand gel and OT transported pt off unit outdoors for well being and B UE strengthening. 3 sets of seated push ups for 5 reps with rest betwee sets. 45 ft, 35 ft then 75 ft total w/c mobility. Once back in recliner in room, pt left with chair alarm set, needs and nurse call button in reach.   Pain:  Pt reported 4/10 pain in L hip and residual limb at start of session then 1/10 after time outdoors and repositioning   Therapy Documentation Precautions:  Precautions Precautions: Fall, Other (comment) Precaution Comments: h/o L BKA (has prosthetic), L LE WBAT, no active hip abduction Restrictions Weight Bearing Restrictions: No LLE Weight Bearing: Weight bearing as tolerated Other Position/Activity Restrictions: no active hip abduction    Therapy/Group: Individual Therapy  Vicenta Dunning 12/09/2022, 7:52 AM

## 2022-12-09 NOTE — Progress Notes (Signed)
Had a quite night. Voided and PVR done and documented. Will encourage void this am as patient. Denies pain. Remains alert and oriented. Safety maintained.

## 2022-12-09 NOTE — Progress Notes (Signed)
Patient requesting to stop insulin, SS. He is not requiring coverage so will discontinue.

## 2022-12-09 NOTE — Progress Notes (Signed)
PROGRESS NOTE   Subjective/Complaints:   Pt reports nub feeling better this AM=  just still having pain on L hip where fx'd.  Denies feeling ill- feels good actually.  Hasn't taken pain meds yet this AM, and pain OK at rest.  Only bothers him when he transfers, walks short distances.  LBM yesterday after laxative.  X2.  [facility] came by yesterday per pt.    ROS:  Pt denies SOB, abd pain, CP, N/V/C/D, and vision changes  Except for HPI  Objective:   No results found. Recent Labs    12/08/22 0652 12/09/22 0622  WBC 12.9* 10.7*  HGB 10.1* 9.4*  HCT 32.2* 29.8*  PLT 244 232   Recent Labs    12/08/22 0652 12/09/22 0622  NA 135 135  K 4.4 4.2  CL 104 107  CO2 22 23  GLUCOSE 97 88  BUN 28* 35*  CREATININE 1.43* 1.33*  CALCIUM 8.8* 8.5*    Intake/Output Summary (Last 24 hours) at 12/09/2022 1837 Last data filed at 12/09/2022 1300 Gross per 24 hour  Intake 340 ml  Output 650 ml  Net -310 ml        Physical Exam: Vital Signs Blood pressure (!) 133/51, pulse (!) 53, temperature 98.3 F (36.8 C), temperature source Oral, resp. rate 16, height 5\' 5"  (1.651 m), weight 76.6 kg, SpO2 98 %.        General: awake, alert, appropriate, eating breakfast;  NAD HENT: conjugate gaze; oropharynx moist CV: bradycardic rate; no JVD Pulmonary: CTA B/L; no W/R/R- good air movement GI: soft, NT, ND, (+)BS- normoactive Psychiatric: appropriate Neurological: Ox3 L BKA - wearing prosthesis-  Musculoskeletal: wearing L BKA prosthesis     Cervical back: Normal range of motion.     Comments: Mild swelling LLE. Left hip with significant tenderness with movement and palpation.  No improvement so far TTP around L hip where has fx.  Skin:    General: Skin is warm and dry.  Neurological:     Mental Status: He is alert.     Comments: Alert and oriented x 3. Normal insight and awareness. Intact Memory. Normal language and  speech. Cranial nerve exam unremarkable. MMT: UE 5/5. RLE 4/5. LLE 3/5 HF, KE d/t pain. Sensory exam normal for light touch and pain in all 4 limbs. No limb ataxia or cerebellar signs. No abnormal tone appreciated.  .     Assessment/Plan: 1. Functional deficits which require 3+ hours per day of interdisciplinary therapy in a comprehensive inpatient rehab setting. Physiatrist is providing close team supervision and 24 hour management of active medical problems listed below. Physiatrist and rehab team continue to assess barriers to discharge/monitor patient progress toward functional and medical goals  Care Tool:  Bathing    Body parts bathed by patient: Right arm, Left arm, Chest, Abdomen, Front perineal area, Right upper leg, Left upper leg   Body parts bathed by helper: Left lower leg, Right lower leg, Buttocks     Bathing assist Assist Level: Moderate Assistance - Patient 50 - 74%     Upper Body Dressing/Undressing Upper body dressing   What is the patient wearing?: Pull over shirt  Upper body assist Assist Level: Set up assist    Lower Body Dressing/Undressing Lower body dressing      What is the patient wearing?: Incontinence brief, Pants     Lower body assist Assist for lower body dressing: Maximal Assistance - Patient 25 - 49%     Toileting Toileting    Toileting assist Assist for toileting: Maximal Assistance - Patient 25 - 49%     Transfers Chair/bed transfer  Transfers assist  Chair/bed transfer activity did not occur: Safety/medical concerns  Chair/bed transfer assist level: Moderate Assistance - Patient 50 - 74% Chair/bed transfer assistive device: Walker, Armrests   Locomotion Ambulation   Ambulation assist   Ambulation activity did not occur: Safety/medical concerns          Walk 10 feet activity   Assist  Walk 10 feet activity did not occur: Safety/medical concerns        Walk 50 feet activity   Assist Walk 50 feet with 2 turns  activity did not occur: Safety/medical concerns         Walk 150 feet activity   Assist Walk 150 feet activity did not occur: Safety/medical concerns         Walk 10 feet on uneven surface  activity   Assist Walk 10 feet on uneven surfaces activity did not occur: Safety/medical concerns         Wheelchair     Assist Is the patient using a wheelchair?: Yes Type of Wheelchair: Manual    Wheelchair assist level: Supervision/Verbal cueing Max wheelchair distance: 163ft    Wheelchair 50 feet with 2 turns activity    Assist        Assist Level: Supervision/Verbal cueing   Wheelchair 150 feet activity     Assist      Assist Level: Supervision/Verbal cueing   Blood pressure (!) 133/51, pulse (!) 53, temperature 98.3 F (36.8 C), temperature source Oral, resp. rate 16, height 5\' 5"  (1.651 m), weight 76.6 kg, SpO2 98 %.  Medical Problem List and Plan: 1. Functional deficits secondary to left greater trochanter fracture             -pt with prior left BKA and prosthesis- incorporate donning/doffing, use of prosthetic into therapy plan/goals. Socket a little tighter than usual due to sl swelling in LLE from fracture and not wearing prosthesis as much.             -patient may  shower             -ELOS/Goals: 10 days, mod I to supervision with PT, OT   D/c- 5/10  Con't CIR PT and OT 2.  Antithrombotics: -DVT/anticoagulation:  Pharmaceutical: Eliquis             -antiplatelet therapy:    3. Pain Management: Tylenol as needed for mild pain             -hydrocodone prn for severe pain             -robaxin prn for spasms             -prn ice   4/27- pain usually controlled per pt- con't regimen- see how he does in therapies  4/30- Pain this AM- waiting for pain meds-  4. Mood/Behavior/Sleep: LCSW to evaluate and provide emotional support             -antipsychotic agents: n/a   5. Neuropsych/cognition: This patient is capable of making decisions on  his own  behalf.   6. Skin/Wound Care: Routine skin care checks   7. Fluids/Electrolytes/Nutrition: Routine Is and Os and follow-up chemistries             -continue vitamin B12 supplements, oral irom   8: Hypertension: monitor TID and prn (lasix 40 mg not restarted)             -continue amlodipine 10 mg daily             -continue atenolol 25 mg daily             -continue hydralazine 75 mg BID   4/27- BP somewhat elevated in 160s this Am systolic- usually 140s systolic- will monitor for trend for another 24 hours before changing meds  4/28-4/29 BP doing better- con't regimen 9: Hyperlipidemia: continue statin   10: Atrial fibrillation, chronic: rate controlled; continue Eliquis, BB   11: CAD: stable; s/p CABG 2000; balloon angioplasty 2003             -follows with Dr. Tenny Craw   12: OSA:    13: Anemia: continue oral iron; follow-up CBC   14: Left greater trochanter displacement; non-opertive management             -WBAT   5/1- pain really impacting therapy -  15: Urinary retention/BPH: continue Proscar and Flomax             -monitor output/PVR's             -OOB to void when possible  4/28- Changed Flomax to q supper- also on Proscar- having bleeding form penis- could be why had blood clots- UTI can explain, but will recheck labs in AM to make sure Hb stable- so far, is 9.9 and stable from prior days. 4/29-  Hb has dropped some 9.3- will recheck in Am and if drops again, will call Urology.   4/30- Hb up to 10.1 and urine now clear 16: DM-2: CBGs QID; carb modified diet; A1c 4.8% on 4/08 (diet controlled at home)             -on SSI but not requiring coverage   4/27- CBG's- 98-171- usually below 130- will reduce CBGs to BID-AC since doing well and A1c is 4.8 17: AKI atop CKD 3: baseline creatinine ~1.3 ??             -follow-up BMP  4/27- will recheck Sunday AM and con't to check weekly   4/28- Cr up slightly to 1.41 from 1.34- baseline Cr 1.3- pt drinking ~ 3 cups/water /day-  asked him to drink 6-8 cups/day and will recheck in AM  4/29- Cr stable at 1.4- will con't topush fluids by [facility] Thursday  4/30- will recheck in AM since BUN/Cr slightly rising.   5/1- Cr down to 1.33 and BUN up to 35- likely dry- since was scared of being cathed.  18: PAD: s/p left BKA 2019 Dr. Wyn Quaker             -continue Trental   19: Leukocytosis: afebrile; on Zosyn for UTI, started today 4/26             -follow-up sensitives -follow-up CBC 4/27- will recheck in AM/Sunday - is afebrile 4/28- WBC down to 10.5 from 12.4 4/29- WBC down to 9.7k 4/30- WBC back up to 12.9- likely because didn't get Cipro treatment as recommended- will recheck in AM 21: Pseudomonas (and E Coli) UTI             -  continue Zosyn iv x 3 days at least then po cipro x 3 more days per [facility]  4/29- changing to Cipro today- having blood clots and sediment in urine- likely due to UTI  4/30- Cipro didn't start yesterday when Zosyn stopped for some reason- will start- since WBC up to 12.9- -will recheck labs in AM  5/1- WBC down to 10.7- so appears like right medicine  22. Urinary retention  4/28- started Flomax- needed in/out cath due to retention- likely due to UTI and constipation  4/29- peed this Am; required cath last night-overnight- still has blood clots- per documentation. But voiding better  4/30- no cath since yesterday am and pt reports not bloody when pees.   5/1- no caths in 48 hours 23. Constipation  4/28- pt has stool ball, it sounds like- trying to break up- also ordered Sorbitol 45cc this AM- and asked nursing to do digital disimpaction if need be.   4/29- LBM yesterday x2- feels better- will increase bowel meds- add Senokot-S 1 tab BID  4/30- LBM yesterday 24. L BKA- old  5/1- had some redness 4/30 so foam placed- looks better today- spoke with PT- will con't to keep an eye on it- pain is doing better   I spent a total of  39  minutes on total care today- >50% coordination of care- due to   D/w OT and PT multiple times today and PA about CBGs- stopped   LOS: 5 days A FACE TO FACE EVALUATION WAS PERFORMED  Phyllistine Domingos 12/09/2022, 6:37 PM

## 2022-12-09 NOTE — Progress Notes (Signed)
Occupational Therapy Weekly Progress Note  Patient Details  Name: Eric [name]. MRN: [id number] Date of Birth: Jan 24, 1928  Beginning of progress report period: December 05, 2022 End of progress report period: Dec 09, 2022  Today's Date: 12/09/2022 OT Individual Time: 1050-1200 OT Individual Time Calculation (min): 70 min    Patient making progress towards LTGs this reporting period. Pt currently requires CGA-Min A + RW for functional transfers and LB dressing, functioning at setup/supervision for UB ADLs. Son present periodically during sessions, formal caregiver education to be scheduled closer to discharge.   Patient continues to demonstrate the following deficits: muscle weakness, decreased cardiorespiratoy endurance, and decreased standing balance, decreased postural control, and decreased balance strategies and therefore will continue to benefit from skilled OT intervention to enhance overall performance with BADL and Reduce care partner burden.  Patient progressing toward long term goals..  Continue plan of care.  OT Short Term Goals Week 2:  OT Short Term Goal 1 (Week 2): STGs=LTGs due to patient's length of stay.  Skilled Therapeutic Interventions/Progress Updates:  Pt received sitting in recliner for skilled OT session with focus on functional transfers and discharge planning. Pt agreeable to interventions, demonstrating overall pleasant mood. Pt with un-rated pain. OT offering intermediate rest breaks and positioning suggestions throughout session to address pain/fatigue and maximize participation/safety in session.  Pt performs recliner>WC stand-pivot with use of prothesis for balance as patient does not bare weight through LLE. In main therapy gym, pt and OT review current DME at home, pt sharing he has sliding shower seat and anticipates no issues with transfer at discharge. In ADL apartment, pt then performs squat-pivot transfer from WC<>BSC with overall CGA, pt stating this is  the type of transfer he does to access toilet a night when his prothesis is not on.   Back in patient's room, pt performs squat pivot transfer to recliner with CGA. Pt does report pain with squat pivot transfers, but does feel confident with technique. Prosthetician performs evaluation during this session, findings communicated to treatment team, but patient cleared to use as normal.   Pt ambulates into bathroom with min A + verbal cuing for threshold management, +2 present for safety.   Pt remained sitting on BSC with nursing present, all immediate needs met at end of session. Pt continues to be appropriate for skilled OT intervention to promote further functional independence.   Therapy Documentation Precautions:  Precautions Precautions: Fall, Other (comment) Precaution Comments: h/o L BKA (has prosthetic), L LE WBAT, no active hip abduction Restrictions Weight Bearing Restrictions: No LLE Weight Bearing: Weight bearing as tolerated Other Position/Activity Restrictions: no active hip abduction   Therapy/Group: Individual Therapy  Lou Cal, OTR/L, MSOT  12/09/2022, 5:20 AM

## 2022-12-10 DIAGNOSIS — S72109A Unspecified trochanteric fracture of unspecified femur, initial encounter for closed fracture: Secondary | ICD-10-CM | POA: Diagnosis not present

## 2022-12-10 LAB — GLUCOSE, CAPILLARY
Glucose-Capillary: 132 mg/dL — ABNORMAL HIGH (ref 70–99)
Glucose-Capillary: 96 mg/dL (ref 70–99)

## 2022-12-10 MED ORDER — TEMAZEPAM 7.5 MG PO CAPS
7.5000 mg | ORAL_CAPSULE | Freq: Every evening | ORAL | Status: DC | PRN
  Administered 2022-12-10 – 2022-12-18 (×8): 7.5 mg via ORAL
  Filled 2022-12-10 (×8): qty 1

## 2022-12-10 MED ORDER — POLYETHYLENE GLYCOL 3350 17 G PO PACK
17.0000 g | PACK | Freq: Every day | ORAL | Status: DC
  Administered 2022-12-10 – 2022-12-18 (×8): 17 g via ORAL
  Filled 2022-12-10 (×9): qty 1

## 2022-12-10 MED ORDER — BETHANECHOL CHLORIDE 10 MG PO TABS
5.0000 mg | ORAL_TABLET | Freq: Three times a day (TID) | ORAL | Status: DC
  Administered 2022-12-10 – 2022-12-18 (×25): 5 mg via ORAL
  Filled 2022-12-10 (×24): qty 1
  Filled 2022-12-10: qty 0.5
  Filled 2022-12-10: qty 1

## 2022-12-10 MED ORDER — PROSOURCE PLUS PO LIQD: 60.0000 mL | Freq: Two times a day (BID) | ORAL | Status: DC

## 2022-12-10 NOTE — Progress Notes (Signed)
Physical Therapy Session Note  Patient Details  Name: Eric [name]. MRN: [id number] Date of Birth: [birth year]  Today's Date: 12/10/2022 PT Individual Time: 0800-0915,1115-1130 PT Individual Time Calculation (min): 75 min, 15 min   Short Term Goals: Week 1:  PT Short Term Goal 1 (Week 1): Pt will perform supine<>sit with no more than CGA PT Short Term Goal 2 (Week 1): Pt will perform sit<>stands using LRAD with no more than CGA PT Short Term Goal 3 (Week 1): Pt will perform bed<>chair transfers using LRAD with CGA PT Short Term Goal 4 (Week 1): Pt will ambulate at least 5ft using LRAD with CGA  Skilled Therapeutic Interventions/Progress Updates:    Session 1: Pt seated EOB, finishing breakfast and agreeable to therapy. Upon mobility, pt reports up to 9/10 pain in standing. Nsg made aware. Therapist removed foams to inspect skin of residual limb d/t reports of redness. Noted small area of discoloration, but blanchable and unsure if related to surgical scarring. Pt does report that distal lateral aspect appears swollen. Whole area was blanchable and not tender to palpation at this time, however pt has had prosthetic off over night. Reapplied foam with effort to minimize wrinkles to prevent pressure points, plan to reassess later in the day.   Prosthetic donned with set up A. Did note genu valgum in sitting and standing. Pt ambulates with antalgic step to pattern. Pt did not report increase in pressure with prosthetic donned in sitting, but did report some in standing. Will assess in later session.   Pt ambulated to/from bathroom with min A for balance and RW management. Pt with continent B+B void, reported to nsg. Pt able to assist with clothing management in standing, but required tot A for hygiene d/t pain. Pt washed hands at w/c level at sink.   Pt propelled w/c with BUE to/from day room for endurance and functional mobility. Pt then participated in 3x 10 w/c push ups for UE strength and  improved transfers with RW. Pt then returned to room and remained in w/c, was left with all needs in reach and alarm active.    Session 2: Pt recd sitting on commode with NT present. Pt missed 15 minutes while completing toileting. Pt performed Stand pivot transfer to recliner with RW and CGA for balance. Pt reports that larger foam dressing is giving more pressure on residual limb, so therapist switched back to 2 long dressings. Improved appearance over larger dressing. No areas of redness noted, but pt has done minimal amounts of standing and his more limited by pain in L hip, unrated, rest and positioning as needed. Pt remained in recliner at end of session and was left with all needs in reach and alarm active.   Therapy Documentation Precautions:  Precautions Precautions: Fall, Other (comment) Precaution Comments: h/o L BKA (has prosthetic), L LE WBAT, no active hip abduction Restrictions Weight Bearing Restrictions: No LLE Weight Bearing: Weight bearing as tolerated Other Position/Activity Restrictions: no active hip abduction General: PT Amount of Missed Time (min): 15 Minutes PT Missed Treatment Reason: Toileting      Therapy/Group: Individual Therapy  [name] 12/10/2022, 8:37 AM

## 2022-12-10 NOTE — Progress Notes (Signed)
PROGRESS NOTE   Subjective/Complaints:   Pt reports nub not hurting (BKA) but has had a little stinging intermittently- however L hip is hurting this AM.  Didn't sleep "at all" last night- doesn't know why- didn't think was pain related- only sleept from 6am to 7am this AM   Required a cath this AM due to not being able to pee-  LBM 4/30- per nurse, going ot give Sorbitol today   ROS:   Pt denies SOB, abd pain, CP, N/V/C/D, and vision changes   Except for HPI  Objective:   No results found. Recent Labs    12/08/22 0652 12/09/22 0622  WBC 12.9* 10.7*  HGB 10.1* 9.4*  HCT 32.2* 29.8*  PLT 244 232   Recent Labs    12/08/22 0652 12/09/22 0622  NA 135 135  K 4.4 4.2  CL 104 107  CO2 22 23  GLUCOSE 97 88  BUN 28* 35*  CREATININE 1.43* 1.33*  CALCIUM 8.8* 8.5*    Intake/Output Summary (Last 24 hours) at 12/10/2022 0823 Last data filed at 12/10/2022 0810 Gross per 24 hour  Intake 576 ml  Output 1200 ml  Net -624 ml        Physical Exam: Vital Signs Blood pressure (!) 146/54, pulse 80, temperature 98 F (36.7 C), resp. rate 17, height 5\' 5"  (1.651 m), weight 76.6 kg, SpO2 98 %.         General: awake, alert, appropriate, initially asleep in bed; breakfast not touched; NAD HENT: conjugate gaze; oropharynx moist CV: regular rate; no JVD Pulmonary: CTA B/L; no W/R/R- good air movement GI: soft, NT, ND, (+)BS- no distension Psychiatric: appropriate but upset about lack of sleep Neurological: Ox3 Skin: L BKA healed- no signs of redness on tip where foam dressings are- or anywhere else on L BKA Musculoskeletal: wearing L BKA prosthesis     Cervical back: Normal range of motion.     Comments: Mild swelling LLE. Left hip with significant tenderness with movement and palpation.  No improvement so far TTP around L hip where has fx.  Skin:    General: Skin is warm and dry.  Neurological:     Mental  Status: He is alert.     Comments: Alert and oriented x 3. Normal insight and awareness. Intact Memory. Normal language and speech. Cranial nerve exam unremarkable. MMT: UE 5/5. RLE 4/5. LLE 3/5 HF, KE d/t pain. Sensory exam normal for light touch and pain in all 4 limbs. No limb ataxia or cerebellar signs. No abnormal tone appreciated.  .     Assessment/Plan: 1. Functional deficits which require 3+ hours per day of interdisciplinary therapy in a comprehensive inpatient rehab setting. Physiatrist is providing close team supervision and 24 hour management of active medical problems listed below. Physiatrist and rehab team continue to assess barriers to discharge/monitor patient progress toward functional and medical goals  Care Tool:  Bathing    Body parts bathed by patient: Right arm, Left arm, Chest, Abdomen, Front perineal area, Right upper leg, Left upper leg   Body parts bathed by helper: Left lower leg, Right lower leg, Buttocks     Bathing assist Assist Level:  Moderate Assistance - Patient 50 - 74%     Upper Body Dressing/Undressing Upper body dressing   What is the patient wearing?: Pull over shirt    Upper body assist Assist Level: Set up assist    Lower Body Dressing/Undressing Lower body dressing      What is the patient wearing?: Incontinence brief, Pants     Lower body assist Assist for lower body dressing: Maximal Assistance - Patient 25 - 49%     Toileting Toileting    Toileting assist Assist for toileting: Maximal Assistance - Patient 25 - 49%     Transfers Chair/bed transfer  Transfers assist  Chair/bed transfer activity did not occur: Safety/medical concerns  Chair/bed transfer assist level: Moderate Assistance - Patient 50 - 74% Chair/bed transfer assistive device: Walker, Armrests   Locomotion Ambulation   Ambulation assist   Ambulation activity did not occur: Safety/medical concerns          Walk 10 feet activity   Assist  Walk 10  feet activity did not occur: Safety/medical concerns        Walk 50 feet activity   Assist Walk 50 feet with 2 turns activity did not occur: Safety/medical concerns         Walk 150 feet activity   Assist Walk 150 feet activity did not occur: Safety/medical concerns         Walk 10 feet on uneven surface  activity   Assist Walk 10 feet on uneven surfaces activity did not occur: Safety/medical concerns         Wheelchair     Assist Is the patient using a wheelchair?: Yes Type of Wheelchair: Manual    Wheelchair assist level: Supervision/Verbal cueing Max wheelchair distance: 117ft    Wheelchair 50 feet with 2 turns activity    Assist        Assist Level: Supervision/Verbal cueing   Wheelchair 150 feet activity     Assist      Assist Level: Supervision/Verbal cueing   Blood pressure (!) 146/54, pulse 80, temperature 98 F (36.7 C), resp. rate 17, height 5\' 5"  (1.651 m), weight 76.6 kg, SpO2 98 %.  Medical Problem List and Plan: 1. Functional deficits secondary to left greater trochanter fracture             -pt with prior left BKA and prosthesis- incorporate donning/doffing, use of prosthetic into therapy plan/goals. Socket a little tighter than usual due to sl swelling in LLE from fracture and not wearing prosthesis as much.             -patient may  shower             -ELOS/Goals: 10 days, mod I to supervision with PT, OT   D/c- 5/10  Con't CIR PT and OT 2.  Antithrombotics: -DVT/anticoagulation:  Pharmaceutical: Eliquis             -antiplatelet therapy:    3. Pain Management: Tylenol as needed for mild pain             -hydrocodone prn for severe pain             -robaxin prn for spasms             -prn ice   4/27- pain usually controlled per pt- con't regimen- see how he does in therapies  4/30- Pain this AM- waiting for pain meds-  4. Mood/Behavior/Sleep: LCSW to evaluate and provide emotional support 5/2- insomnia- called  pharmacy- will start Restoril 7.5 mg QHS prn               -antipsychotic agents: n/a   5. Neuropsych/cognition: This patient is capable of making decisions on his own behalf.   6. Skin/Wound Care: Routine skin care checks   7. Fluids/Electrolytes/Nutrition: Routine Is and Os and follow-up chemistries             -continue vitamin B12 supplements, oral irom   8: Hypertension: monitor TID and prn (lasix 40 mg not restarted)             -continue amlodipine 10 mg daily             -continue atenolol 25 mg daily             -continue hydralazine 75 mg BID   4/27- BP somewhat elevated in 160s this Am systolic- usually 140s systolic- will monitor for trend for another 24 hours before changing meds  4/28-4/29 BP doing better- con't regimen  5/2- BP very slightly elevated, but usually controlled- con't regimen 9: Hyperlipidemia: continue statin   10: Atrial fibrillation, chronic: rate controlled; continue Eliquis, BB   11: CAD: stable; s/p CABG 2000; balloon angioplasty 2003             -follows with Dr. Tenny Craw   12: OSA:    13: Anemia: continue oral iron; follow-up CBC   14: Left greater trochanter displacement; non-opertive management             -WBAT   5/1- pain really impacting therapy -  15: Urinary retention/BPH: continue Proscar and Flomax             -monitor output/PVR's             -OOB to void when possible  4/28- Changed Flomax to q supper- also on Proscar- having bleeding form penis- could be why had blood clots- UTI can explain, but will recheck labs in AM to make sure Hb stable- so far, is 9.9 and stable from prior days. 4/29-  Hb has dropped some 9.3- will recheck in Am and if drops again, will call Urology.   4/30- Hb up to 10.1 and urine now clear 5/2- required a cath last night/this AM - x1- on max dose of Flomax- will add Urecholine 5 mg TID to help-  16: DM-2: CBGs QID; carb modified diet; A1c 4.8% on 4/08 (diet controlled at home)             -on SSI but not  requiring coverage   4/27- CBG's- 98-171- usually below 130- will reduce CBGs to BID-AC since doing well and A1c is 4.8  5/2- stopped SSI/CBGs 17: AKI atop CKD 3: baseline creatinine ~1.3 ??             -follow-up BMP  4/27- will recheck Sunday AM and con't to check weekly   4/28- Cr up slightly to 1.41 from 1.34- baseline Cr 1.3- pt drinking ~ 3 cups/water /day- asked him to drink 6-8 cups/day and will recheck in AM  4/29- Cr stable at 1.4- will con't topush fluids by [facility] Thursday  4/30- will recheck in AM since BUN/Cr slightly rising.   5/1- Cr down to 1.33 and BUN up to 35- likely dry- since was scared of being cathed.  18: PAD: s/p left BKA 2019 Dr. Wyn Quaker             -continue Trental   19: Leukocytosis: afebrile; on Zosyn  for UTI, started today 4/26             -follow-up sensitives -follow-up CBC 4/27- will recheck in AM/Sunday - is afebrile 4/28- WBC down to 10.5 from 12.4 4/29- WBC down to 9.7k 4/30- WBC back up to 12.9- likely because didn't get Cipro treatment as recommended- will recheck in AM 21: Pseudomonas (and E Coli) UTI             -continue Zosyn iv x 3 days at least then po cipro x 3 more days per [facility]  4/29- changing to Cipro today- having blood clots and sediment in urine- likely due to UTI  4/30- Cipro didn't start yesterday when Zosyn stopped for some reason- will start- since WBC up to 12.9- -will recheck labs in AM  5/1- WBC down to 10.7- so appears like right medicine  22. Urinary retention  4/28- started Flomax- needed in/out cath due to retention- likely due to UTI and constipation  4/29- peed this Am; required cath last night-overnight- still has blood clots- per documentation. But voiding better  4/30- no cath since yesterday am and pt reports not bloody when pees.   5/1- no caths in 48 hours 23. Constipation  4/28- pt has stool ball, it sounds like- trying to break up- also ordered Sorbitol 45cc this AM- and asked nursing to do digital  disimpaction if need be.   4/29- LBM yesterday x2- feels better- will increase bowel meds- add Senokot-S 1 tab BID  4/30- LBM yesterday  5/2- LBM 4/30- nursing ot give Sorbitol today after therapy-will add Miralax to meds 24. L BKA- old  5/1- had some redness 4/30 so foam placed- looks better today- spoke with PT- will con't to keep an eye on it- pain is doing better  5/2- skin looks great this AM- no pain on "nub".    I spent a total of 43   minutes on total care today- >50% coordination of care- due to  Called pharmacy to help figure out sleeping medicine due to age and urinary retention- also d/w nursing  LOS: 6 days A FACE TO FACE EVALUATION WAS PERFORMED  Garison Genova 12/10/2022, 8:23 AM

## 2022-12-10 NOTE — Progress Notes (Signed)
Patient ID: Eric [name]., male   DOB: 31-Jan-1928, 89 y.o.   MRN: [id number]  SW returned phone call to pt son Eric [name] who wanted to schedule family edu. Scheduled for Fri (5/3) 1pm-4pm with his two daughters, and Mon (5/6) 1pm-4pm with his two sons. He is aware SW will inform primary SW on what has been scheduled and she will follow-up moving forward.   Cecile Sheerer, MSW, LCSWA Office: [phone] Cell: [phone] Fax: [phone]

## 2022-12-10 NOTE — Progress Notes (Signed)
Occupational Therapy Session Note  Patient Details  Name: Eric [name]. MRN: [id number] Date of Birth: Jul 24, 1928  Today's Date: 12/10/2022 OT Individual Time: 0930-1050 OT Individual Time Calculation (min): 80 min    Short Term Goals: Week 1:  OT Short Term Goal 1 (Week 1): Pt will be able to ambulate to bathroom with RW with CGA. OT Short Term Goal 1 - Progress (Week 1): Progressing toward goal OT Short Term Goal 2 (Week 1): Pt will complete stand pivot transfer wc to toilet with Supervision with RW. OT Short Term Goal 2 - Progress (Week 1): Progressing toward goal OT Short Term Goal 3 (Week 1): Pt will be able to don pants over feet with CGA. OT Short Term Goal 3 - Progress (Week 1): Progressing toward goal OT Short Term Goal 4 (Week 1): Pt will be able to pull pants over hips with CGA. OT Short Term Goal 4 - Progress (Week 1): Progressing toward goal  Skilled Therapeutic Interventions/Progress Updates:    Pt resting in w/c upon arrival with son present. OT intervention with focus on bathing/dressing with sit<>stand at sink, w/c mgmt, standing balance, and BUE therex to increase independence with bADLs. Sit<>stand from w/c with min A and min A for standing balance. Pt requires assistance to bathe buttocks and pull pants up. Pt continues to required BUE support on RW when standing. Pt propelled w/c to day room and completed BUE therex circuit reaching up and outward with 4# bar bells. Pt propelled w/c back to room. Squat pivot transfer to recliner with mod A. Pt remained in recliner. Belt alarm actiated. All needs within reach. Son present.   Therapy Documentation Precautions:  Precautions Precautions: Fall, Other (comment) Precaution Comments: h/o L BKA (has prosthetic), L LE WBAT, no active hip abduction Restrictions Weight Bearing Restrictions: No LLE Weight Bearing: Weight bearing as tolerated Other Position/Activity Restrictions: no active hip abduction  Pain: Pt reports  Lt hip pain (unrated); RN admin meds during session   Therapy/Group: Individual Therapy  Rich Brave 12/10/2022, 12:02 PM

## 2022-12-10 NOTE — Progress Notes (Signed)
Patient c/o pelvic pressure and difficulty starting urine stream. Patient was able to void however, abdomen appears distended and patient states that her feels the need to still void. PVR bladder scan was greater than 397. Patient attempted to double void but was unsuccessful after several minutes. Intermittent catheterization was done and volume was 700. Urine cloudy with sediments

## 2022-12-10 NOTE — Progress Notes (Signed)
Physical Therapy Session Note  Patient Details  Name: Eric [name]. MRN: [id number] Date of Birth: 1928/07/04  Today's Date: 12/10/2022 PT Individual Time: 7829-5621 PT Individual Time Calculation (min): 45 min  and Today's Date: 12/10/2022 PT Missed Time: 15 Minutes (staff meeting) Missed Time Reason: Other (Comment)  Short Term Goals: Week 1:  PT Short Term Goal 1 (Week 1): Pt will perform supine<>sit with no more than CGA PT Short Term Goal 2 (Week 1): Pt will perform sit<>stands using LRAD with no more than CGA PT Short Term Goal 3 (Week 1): Pt will perform bed<>chair transfers using LRAD with CGA PT Short Term Goal 4 (Week 1): Pt will ambulate at least 16ft using LRAD with CGA  Skilled Therapeutic Interventions/Progress Updates:      Therapy Documentation Precautions:  Precautions Precautions: Fall, Other (comment) Precaution Comments: h/o L BKA (has prosthetic), L LE WBAT, no active hip abduction Restrictions Weight Bearing Restrictions: No LLE Weight Bearing: Weight bearing as tolerated Other Position/Activity Restrictions: no active hip abduction   Pt and son present for PT session. Pt reports his prosthesis has not been adjusted in the past five years. Prior to admission pt describes fluctuating edema and he states he cut his liner to help decrease pain and improve edema. Pt with unrated residual limb and hip pain with weight bearing during gait and with redness to skin a few days prior following ambulation. PT observed gait with prosthesis and pt requires CGA for ambulation ~10 ft. Pt presents with left knee valgus and decreased hip extension with gait. PT notified prosthetist and they plan to assess prosthesis with mobility on 5/3 at 1. Pt left seated in recliner with nurse tech present for toileting.    Therapy/Group: Individual Therapy  [name] [name] PT, DPT  12/10/2022, 7:57 AM

## 2022-12-10 NOTE — Progress Notes (Signed)
Patient ID: Eric [name]., male   DOB: 1928/01/26, 89 y.o.   MRN: [id number]  Family education arranged for son's to come in Monday 1-4 pm and the daughter's next Thursday 1-4 pm. Have let team know of this plan. Will work on discharge needs for next Friday.

## 2022-12-11 DIAGNOSIS — S72112S Displaced fracture of greater trochanter of left femur, sequela: Secondary | ICD-10-CM

## 2022-12-11 DIAGNOSIS — F54 Psychological and behavioral factors associated with disorders or diseases classified elsewhere: Secondary | ICD-10-CM

## 2022-12-11 MED ORDER — CAMPHOR-MENTHOL 0.5-0.5 % EX LOTN: TOPICAL_LOTION | CUTANEOUS | Status: DC | PRN

## 2022-12-11 NOTE — Progress Notes (Signed)
Noted small amount of blood after I/O cath. Notified Setzer, PA.   Tilden Dome, LPN

## 2022-12-11 NOTE — Progress Notes (Signed)
Physical Therapy Session Note  Patient Details  Name: Eric [name]. MRN: [id number] Date of Birth: [birth year]  Today's Date: 12/11/2022 PT Individual Time: 0845-1000 PT Individual Time Calculation (min): 75 min   Short Term Goals: Week 1:  PT Short Term Goal 1 (Week 1): Pt will perform supine<>sit with no more than CGA PT Short Term Goal 2 (Week 1): Pt will perform sit<>stands using LRAD with no more than CGA PT Short Term Goal 3 (Week 1): Pt will perform bed<>chair transfers using LRAD with CGA PT Short Term Goal 4 (Week 1): Pt will ambulate at least 68ft using LRAD with CGA  Skilled Therapeutic Interventions/Progress Updates:    Pt received in recliner and agreeable to therapy.  Pt reports up to 5/10 pain in hip, rest and positioning as needed. Pt performed Sit to stand and Stand pivot transfer with CGA for balance during session. Pt propelled w/c with BUE to/from gym, for endurance and functional mobility.   Pt participated in stepping activity with LLE for improved confidence and balance. Progressed from BUE support to UUE 2 finger support on RW, with pt demoing improved confidence and balance throughout course of session.   Discussed pt's home exercise routine, discussed modifying for intensity and safe exercise and conditioning principles. Pt performed self directed stretching while discussing his home exercise routine. Pt also demoed exercises with 5lb weight. Pt normally uses lighter weights, but discussed that because he is able to do so many reps (up to 40) he could do a heavier weight and do less reps for improved efficiency, muscle strength, etc.  Home routine as follows with 5lb weights: Pot stirs out and in Bicep curls Modified russian twist Provided feed back on several exercises that pt invented himself, for improved efficiency and safety.  Pt then performed 3 bouts of Sit to stand with challenge to stand without RW. Pt fading from RW only at top of stand to achieving x  1 during each of last two sets. Pt then stood for ~68min without UE support while testing his balance. Pt returned to room and recliner, was left with all needs in reach and alarm active.    Therapy Documentation Precautions:  Precautions Precautions: Fall, Other (comment) Precaution Comments: h/o L BKA (has prosthetic), L LE WBAT, no active hip abduction Restrictions Weight Bearing Restrictions: No LLE Weight Bearing: Weight bearing as tolerated Other Position/Activity Restrictions: no active hip abduction General:       Therapy/Group: Individual Therapy  [name] 12/11/2022, 9:20 AM

## 2022-12-11 NOTE — Progress Notes (Signed)
Pt is voiding but not emptying completely. Pt voided , post residual bladder scan of 392. Cath for . Pt voiced relief but was still complaining of feeling full. Post void residual 2ml. No further concerns at this time. Call bell in reach, bed alarm on.

## 2022-12-11 NOTE — Progress Notes (Signed)
Educated patient and son on I/O Cath procedure and technique. Patient and son will need reinforcement. POC in progress. Will Notify oncoming nurse.    Tilden Dome, LPN

## 2022-12-11 NOTE — Consult Note (Signed)
Neuropsychological Consultation Comprehensive Inpatient Rehab   Patient:   Eric [name].   DOB:   18-Jul-1928  MR Number:  [id number]  Location:  [facility] [address] CENTER A [id number] [place] [address] Dept: [phone] Loc: [phone]           Date of Service:   12/11/2022  Start Time:   10 AM End Time:   11 AM  Provider/Observer:  Arley Phenix, Psy.D.       Clinical Neuropsychologist       Billing Code/Service: [phone]  Reason for Service:    Eric Tawil. is a 89 year old male referred for neuropsychological consultation during his ongoing inpatient rehabilitation admission.  Patient had a past medical history including left BKA in 2019 and use of prosthetic.  Patient presented to the emergency department on 11/30/2022 complaining of left hip pain.  Patient noted today that he knew something bad it happened and instructed his son to call EMS as soon as it happened.  Patient reports that he was getting up for his lift chair as usual and did not note any particular differences.  Patient reports that he noted sharp pain in his left hip.  He had his prosthetic on.  Patient reports that as he went to the ground due to pain that he twisted over to his side and felt an even greater acute pain and immediately knew that he had broken a bone.  Patient reports that he is not sure if there has been an actual break before or during his dissent to the ground.  Patient denied striking the ground with anything other than his arms and his lower body and suffered no head trauma or loss of consciousness.  Patient was noted to be positive for fracture of the left greater Trochanter.  Patient was admitted to medical service and on Eliquis for A-fib.  During visit today patient was oriented with good cognition and recall of events.  Patient reports that he has maintained his normal baseline cognitive functioning and  was able to accurately describe not only events that happened prior to hospital admission but also detailed information about his ongoing rehab admission.  Patient noted some swelling at his prior BKA surgical site and had noticed some swelling prior to his fall.  It is being monitored by attending.  Patient notes that he feels like he is improving and maintaining strength with expected return to use of his prosthetic.  Patient denies any significant alteration in mood.  HPI for the current admission:    HPI: Eric [name], Eric [name] is a 89 year old male presented to the ED on 11/30/2022 complaining of left hip pain. He evidently got up from a chair, felt left hip pain and lowered himself to the floor. No other injury or LOC. He is s/p left BKA in 2019 and does use a prosthesis.  X-ray of patient's left hip shows positive fracture at the left greater trochanter.  Discussed with Dr. Victorino Dike from orthopedics and he saw the patient consultation.  Admitted to the medicine service. He is on Eliquis for atrial fibrillation. This was initially held for possible surgical intervention. Oral anti-hypotensives continued. Found to have AKI atop CKD stage III. Improved with hydration. PMH significant for chronic anemia, CAD, OSA, PVD, hyperlipidemia. DM-2 diet controlled.  UA positive for GNR and started on Zosyn today. Plan is to convert to Cipro. Follow-up culture results.  Able to stand from  recliner with min assist, very little WB through LLE while wearing prosthesis. Min assist with small pivotal steps to bed, requires extra time but gradually putting more pressure through LLE. Good control with descent. The patient requires inpatient medicine and rehabilitation evaluations and services for ongoing dysfunction secondary to left greater trochanter fracture; prior left BKA.   Medical History:   Past Medical History:  Diagnosis Date   Atrial flutter (HCC)    s/p RFCA   CAD (coronary artery disease)    cath 2003, occluded  S-RCA, occluded S-Dx, L-LAD ok, s/p PTCA to LAD   Cataract    CKD (chronic kidney disease) stage 3, GFR 30-59 ml/min (HCC)    Diabetes mellitus    diet controlled   Hearing aid worn    bilateral   History of kidney stones    Long term (current) use of anticoagulants    Neuromuscular disorder (HCC)    OSA (obstructive sleep apnea) 12/11   very mild, AHI 7/hr   Persistent atrial fibrillation (HCC) 09/26/2015   Pure hyperglyceridemia    PVD (peripheral vascular disease) (HCC)    angioplasty of his right lower extremity in [place] by Dr.[name] 2013   Unspecified essential hypertension    Wears dentures    full upper and lower         Patient Active Problem List   Diagnosis Date Noted   Traumatic closed trochanteric fracture of femur with minimal displacement with routine healing, left 12/05/2022   Greater trochanter fracture (HCC) 12/04/2022   Closed left femoral fracture (HCC) 11/30/2022   Pain due to onychomycosis of toenails of both feet 04/23/2022   Hammer toes, bilateral 04/23/2022   Porokeratosis 04/23/2022   Diverticulosis of colon with hemorrhage    Erosive gastritis    Benign neoplasm of transverse colon    Benign neoplasm of descending colon    Acute GI bleeding 04/27/2021   Acute blood loss anemia 04/27/2021   Acute kidney injury superimposed on CKD (HCC) 04/27/2021   Hematuria 07/24/2020   Hypertensive urgency 03/08/2019   Hx of BKA, left (HCC) 01/13/2018   Nausea and vomiting in adult 12/26/2017   Pressure injury of skin 12/23/2017   C. 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  presents as a 89 y.o.-year-old Right handed Caucasian Male who appeared his stated age. his dress was Appropriate and he was Well Groomed and his manners were Appropriate to the situation.  his participation was indicative of Appropriate and Attentive behaviors.  There were physical disabilities noted.  he displayed an appropriate level of cooperation and motivation.    Interactions:    Active Appropriate  Attention:   within normal limits and attention span and concentration were age  appropriate  Memory:   within normal limits; recent and remote memory intact  Visuo-spatial:   not examined  Speech (Volume):  normal  Speech:   normal; normal  Thought Process:  Coherent and Relevant  Coherent, Linear, and Logical  Though Content:  WNL; not suicidal and not homicidal  Orientation:   person, place, time/date, and situation  Judgment:   Good  Planning:   Good  Affect:    Appropriate  Mood:    Euthymic  Insight:   Good  Intelligence:   normal  Psychiatric History:  No prior psychiatric history noted.  Family Med/Psych History:  Family History  Problem Relation Age of Onset   Stroke Father    Aneurysm Father    Hypertension Father    Prostate cancer Brother    Hypertension Mother    Lung cancer Brother        smoker   Thrombosis Brother    Other Brother        RF valve disorder (smoker)   Lung cancer Brother        smoker   Breast cancer Sister    Liver cancer Brother     Other Sister        cerebral hemorrhage    Impression/DX:   Eric [name]. is a 88 year old male referred for neuropsychological consultation during his ongoing inpatient rehabilitation admission.  Patient had a past medical history including left BKA in 2019 and use of prosthetic.  Patient presented to the emergency department on 11/30/2022 complaining of left hip pain.  Patient noted today that he knew something bad it happened and instructed his son to call EMS as soon as it happened.  Patient reports that he was getting up for his lift chair as usual and did not note any particular differences.  Patient reports that he noted sharp pain in his left hip.  He had his prosthetic on.  Patient reports that as he went to the ground due to pain that he twisted over to his side and felt an even greater acute pain and immediately knew that he had broken a bone.  Patient reports that he is not sure if there has been an actual break before or during his dissent to the ground.  Patient denied striking the ground with anything other than his arms and his lower body and suffered no head trauma or loss of consciousness.  Patient was noted to be positive for fracture of the left greater Trochanter.  Patient was admitted to medical service and on Eliquis for A-fib.  During visit today patient was oriented with good cognition and recall of events.  Patient reports that he has maintained his normal baseline cognitive functioning and was able to accurately describe not only events that happened prior to hospital admission but also detailed information about his ongoing rehab admission.  Patient noted some swelling at his prior BKA surgical site and had noticed some swelling prior to his fall.  It is being monitored by attending.  Patient notes that he feels like he is improving and maintaining strength with expected return to use of his prosthetic.  Patient denies any significant alteration in  mood.  Disposition/Plan:  Today we worked on coping and adjustment issues dealing with a recent hip fracture with previous BKA (left) in 2019.         Electronically Signed   _______________________ Arley Phenix, Psy.D. Clinical Neuropsychologist

## 2022-12-11 NOTE — Progress Notes (Signed)
Physical Therapy Session Note  Patient Details  Name: Eric [name]. MRN: [id number] Date of Birth: 08-03-1928  Today's Date: 12/11/2022 PT Individual Time: 1300-1355 PT Individual Time Calculation (min): 55 min   Short Term Goals: Week 1:  PT Short Term Goal 1 (Week 1): Pt will perform supine<>sit with no more than CGA PT Short Term Goal 2 (Week 1): Pt will perform sit<>stands using LRAD with no more than CGA PT Short Term Goal 3 (Week 1): Pt will perform bed<>chair transfers using LRAD with CGA PT Short Term Goal 4 (Week 1): Pt will ambulate at least 79ft using LRAD with CGA  Skilled Therapeutic Interventions/Progress Updates:   Received pt sitting in recliner with family present at bedside. Pt agreeable to PT treatment and reported pain 6/10 in L hip - RN notified of pt's request for pain medicine. Olivia from [place] present to inspect L prosthetic. Pt transferred recliner<>WC stand<>pivot with RW and CGA (pt reporting increased pain weight bearing onto L leg). Pt transported to dayroom in [facility] dependently for time management purposes.   Removed prosthetic to inspect L residual limb - noted edema pocket on L lateral aspect of limb and red pressure area along tibia. Donned prosthetic and stood from [facility] with RW and CGA and ambulated 31ft with RW and CGA - limited by pain in L hip and distal aspect of residual limb. Removed prosthetic again and [employer] took prosthetic and padding to cut hole along tibia and L lateral aspect to relieve pressure/swelling; therefore remainder of session completed without prosthetic. Donned L shrinker with total A and RN arrived to [employer] medication. Pt performed RLE strengthening on Kinetron at 20 cm/sec for 1 minute x 4 trials with therapy providing manual counter resistance with emphasis on glute/quad strength. Pt performed WC push ups 2x8 with emphasis on UE strength, then stood from [facility] with RW and heavy min A x 1 rep. Pt hesitant to stand without prosthetic,  reporting that his L leg has always been his strong leg and he doesn't trust his balance on his R leg. RN requesting to cath pt and pt performed WC mobility 160ft using BUE and supervision back to room. Transferred WC<>bed squat<>pivot to R with min A and sit<>semi-reclined with supervision. Concluded session with pt semi-reclined in bed, needs within reach, and bed alarm on.    Therapy Documentation Precautions:  Precautions Precautions: Fall, Other (comment) Precaution Comments: h/o L BKA (has prosthetic), L LE WBAT, no active hip abduction Restrictions Weight Bearing Restrictions: No LLE Weight Bearing: Weight bearing as tolerated Other Position/Activity Restrictions: no active hip abduction  Therapy/Group: Individual Therapy Martin Majestic PT, DPT  12/11/2022, 7:07 AM

## 2022-12-11 NOTE — Progress Notes (Signed)
PROGRESS NOTE   Subjective/Complaints:   Pt reports 2 Bms yesterday after Sorbitol- 1 was real good one  Slept great- feels the best has in days.  Was incontinent of urine this AM- didn't know it per OTA.  Has a newly seen bruise back of L thigh- was painful yesterday but better today.  Itching from laying on back so much.  Required a cath this AM- they got out 800cc, even though voided 300cc- likely urinary incontinence was overflow.   York Spaniel has had issues with bladder and not emptying for months if not longer.    ROS:   Pt denies SOB, abd pain, CP, N/V/C/D, and vision changes Requiring in/out caths  Except for HPI  Objective:   No results found. Recent Labs    12/09/22 0622  WBC 10.7*  HGB 9.4*  HCT 29.8*  PLT 232   Recent Labs    12/09/22 0622  NA 135  K 4.2  CL 107  CO2 23  GLUCOSE 88  BUN 35*  CREATININE 1.33*  CALCIUM 8.5*    Intake/Output Summary (Last 24 hours) at 12/11/2022 0758 Last data filed at 12/11/2022 0519 Gross per 24 hour  Intake 716 ml  Output 1375 ml  Net -659 ml        Physical Exam: Vital Signs Blood pressure (!) 148/59, pulse (!) 50, temperature 98 F (36.7 C), resp. rate 17, height 5\' 5"  (1.651 m), weight 76.6 kg, SpO2 99 %.          General: awake, alert, appropriate, sitting EOB getting cleaned up with OTA;  NAD HENT: conjugate gaze; oropharynx moist CV: bradycardic rate; no JVD Pulmonary: CTA B/L; no W/R/R- good air movement GI: soft, NT, ND, (+)BS- more normoactive Psychiatric: appropriate- brighter affect- slept well per pt Neurological: Ox3 Skin: L BKA healed- no signs of redness on tip where foam dressings are- or anywhere else on L BKA - has large deep purple bruise on posterior aspect of L distal thigh.  Musculoskeletal: wearing L BKA prosthesis     Cervical back: Normal range of motion.     Comments: Mild swelling LLE. Left hip with significant  tenderness with movement and palpation.  No improvement so far TTP around L hip where has fx.  Skin:    General: Skin is warm and dry.  Neurological:     Mental Status: He is alert.     Comments: Alert and oriented x 3. Normal insight and awareness. Intact Memory. Normal language and speech. Cranial nerve exam unremarkable. MMT: UE 5/5. RLE 4/5. LLE 3/5 HF, KE d/t pain. Sensory exam normal for light touch and pain in all 4 limbs. No limb ataxia or cerebellar signs. No abnormal tone appreciated.  .     Assessment/Plan: 1. Functional deficits which require 3+ hours per day of interdisciplinary therapy in a comprehensive inpatient rehab setting. Physiatrist is providing close team supervision and 24 hour management of active medical problems listed below. Physiatrist and rehab team continue to assess barriers to discharge/monitor patient progress toward functional and medical goals  Care Tool:  Bathing    Body parts bathed by patient: Right arm, Left arm, Chest, Abdomen, Front perineal  area, Right upper leg, Left upper leg   Body parts bathed by helper: Right lower leg, Buttocks Body parts n/a: Left lower leg   Bathing assist Assist Level: Moderate Assistance - Patient 50 - 74%     Upper Body Dressing/Undressing Upper body dressing   What is the patient wearing?: Pull over shirt    Upper body assist Assist Level: Set up assist    Lower Body Dressing/Undressing Lower body dressing      What is the patient wearing?: Incontinence brief, Pants     Lower body assist Assist for lower body dressing: Maximal Assistance - Patient 25 - 49%     Toileting Toileting    Toileting assist Assist for toileting: Maximal Assistance - Patient 25 - 49%     Transfers Chair/bed transfer  Transfers assist  Chair/bed transfer activity did not occur: Safety/medical concerns  Chair/bed transfer assist level: Moderate Assistance - Patient 50 - 74% Chair/bed transfer assistive device: Walker,  Armrests   Locomotion Ambulation   Ambulation assist   Ambulation activity did not occur: Safety/medical concerns          Walk 10 feet activity   Assist  Walk 10 feet activity did not occur: Safety/medical concerns        Walk 50 feet activity   Assist Walk 50 feet with 2 turns activity did not occur: Safety/medical concerns         Walk 150 feet activity   Assist Walk 150 feet activity did not occur: Safety/medical concerns         Walk 10 feet on uneven surface  activity   Assist Walk 10 feet on uneven surfaces activity did not occur: Safety/medical concerns         Wheelchair     Assist Is the patient using a wheelchair?: Yes Type of Wheelchair: Manual    Wheelchair assist level: Supervision/Verbal cueing Max wheelchair distance: 145ft    Wheelchair 50 feet with 2 turns activity    Assist        Assist Level: Supervision/Verbal cueing   Wheelchair 150 feet activity     Assist      Assist Level: Supervision/Verbal cueing   Blood pressure (!) 148/59, pulse (!) 50, temperature 98 F (36.7 C), resp. rate 17, height 5\' 5"  (1.651 m), weight 76.6 kg, SpO2 99 %.  Medical Problem List and Plan: 1. Functional deficits secondary to left greater trochanter fracture             -pt with prior left BKA and prosthesis- incorporate donning/doffing, use of prosthetic into therapy plan/goals. Socket a little tighter than usual due to sl swelling in LLE from fracture and not wearing prosthesis as much.             -patient may  shower             -ELOS/Goals: 10 days, mod I to supervision with PT, OT   D/c- 5/10  Con't CIR PT and OT 2.  Antithrombotics: -DVT/anticoagulation:  Pharmaceutical: Eliquis             -antiplatelet therapy:    3. Pain Management: Tylenol as needed for mild pain             -hydrocodone prn for severe pain             -robaxin prn for spasms             -prn ice   4/27- pain usually controlled per pt-  con't regimen- see how he does in therapies  4/30- Pain this AM- waiting for pain meds-  4. Mood/Behavior/Sleep: LCSW to evaluate and provide emotional support 5/2- insomnia- called pharmacy- will start Restoril 7.5 mg QHS prn  5/3- slept much better- wants ot con't Restoril             -antipsychotic agents: n/a   5. Neuropsych/cognition: This patient is capable of making decisions on his own behalf.   6. Skin/Wound Care: Routine skin care checks   7. Fluids/Electrolytes/Nutrition: Routine Is and Os and follow-up chemistries             -continue vitamin B12 supplements, oral irom   8: Hypertension: monitor TID and prn (lasix 40 mg not restarted)             -continue amlodipine 10 mg daily             -continue atenolol 25 mg daily             -continue hydralazine 75 mg BID   4/27- BP somewhat elevated in 160s this Am systolic- usually 140s systolic- will monitor for trend for another 24 hours before changing meds  4/28-4/29 BP doing better- con't regimen  5/3- BP very slightly elevated usually controlled- could be pain? Will monitor trend 9: Hyperlipidemia: continue statin   10: Atrial fibrillation, chronic: rate controlled; continue Eliquis, BB   11: CAD: stable; s/p CABG 2000; balloon angioplasty 2003             -follows with Dr. Tenny Craw   12: OSA:    13: Anemia: continue oral iron; follow-up CBC   14: Left greater trochanter displacement; non-opertive management             -WBAT   5/1- pain really impacting therapy -  5/3- pain doing better per pt/staff 15: Urinary retention/BPH: continue Proscar and Flomax             -monitor output/PVR's             -OOB to void when possible  4/28- Changed Flomax to q supper- also on Proscar- having bleeding form penis- could be why had blood clots- UTI can explain, but will recheck labs in AM to make sure Hb stable- so far, is 9.9 and stable from prior days. 4/29-  Hb has dropped some 9.3- will recheck in Am and if drops again, will  call Urology.   4/30- Hb up to 10.1 and urine now clear 5/2- required a cath last night/this AM - x1- on max dose of Flomax- will add Urecholine 5 mg TID to help-   5/3- per pt, has hx of urinary retention- not emptying- for "months"- asked nursing and wrote order to teach pt in/out caths. Could increase Urecholine this weekend if need be.  16: DM-2: CBGs QID; carb modified diet; A1c 4.8% on 4/08 (diet controlled at home)             -on SSI but not requiring coverage   4/27- CBG's- 98-171- usually below 130- will reduce CBGs to BID-AC since doing well and A1c is 4.8  5/2- stopped SSI/CBGs 17: AKI atop CKD 3: baseline creatinine ~1.3 ??             -follow-up BMP  4/27- will recheck Sunday AM and con't to check weekly   4/28- Cr up slightly to 1.41 from 1.34- baseline Cr 1.3- pt drinking ~ 3 cups/water /day- asked him to drink 6-8 cups/day  and will recheck in AM  4/29- Cr stable at 1.4- will con't topush fluids by [facility] Thursday  4/30- will recheck in AM since BUN/Cr slightly rising.   5/1- Cr down to 1.33 and BUN up to 35- likely dry- since was scared of being cathed.   5/3- Labs Monday 18: PAD: s/p left BKA 2019 Dr. Wyn Quaker             -continue Trental   19: Leukocytosis: afebrile; on Zosyn for UTI, started today 4/26             -follow-up sensitives -follow-up CBC 4/27- will recheck in AM/Sunday - is afebrile 4/28- WBC down to 10.5 from 12.4 4/29- WBC down to 9.7k 4/30- WBC back up to 12.9- likely because didn't get Cipro treatment as recommended- will recheck in AM 21: Pseudomonas (and E Coli) UTI             -continue Zosyn iv x 3 days at least then po cipro x 3 more days per [facility]  4/29- changing to Cipro today- having blood clots and sediment in urine- likely due to UTI  4/30- Cipro didn't start yesterday when Zosyn stopped for some reason- will start- since WBC up to 12.9- -will recheck labs in AM  5/1- WBC down to 10.7- so appears like right medicine  22. Urinary  retention  4/28- started Flomax- needed in/out cath due to retention- likely due to UTI and constipation  4/29- peed this Am; required cath last night-overnight- still has blood clots- per documentation. But voiding better  4/30- no cath since yesterday am and pt reports not bloody when pees.   5/3- still requiring caths- will start teaching- and let SW know will likely go home cathing.  23. Constipation  4/28- pt has stool ball, it sounds like- trying to break up- also ordered Sorbitol 45cc this AM- and asked nursing to do digital disimpaction if need be.   4/29- LBM yesterday x2- feels better- will increase bowel meds- add Senokot-S 1 tab BID  4/30- LBM yesterday  5/2- LBM 4/30- nursing ot give Sorbitol today after therapy-will add Miralax to meds  5/3- LBM yesterday x2 with sorbitol-  24. L BKA- old  5/1- had some redness 4/30 so foam placed- looks better today- spoke with PT- will con't to keep an eye on it- pain is doing better  5/2- skin looks great this AM- no pain on "nub".    I spent a total of  51   minutes on total care today- >50% coordination of care- due to  D/w SW about cath supplies; d/w nursing to teach caths and with OTA about urinary retention and overflow.     LOS: 7 days A FACE TO FACE EVALUATION WAS PERFORMED  Enijah Furr 12/11/2022, 7:58 AM

## 2022-12-11 NOTE — Progress Notes (Signed)
Occupational Therapy Session Note  Patient Details  Name: Eric [name]. MRN: [id number] Date of Birth: Dec 11, 1927  Today's Date: 12/11/2022 OT Individual Time: 0700-0810 OT Individual Time Calculation (min): 70 min    Short Term Goals: Week 1:  OT Short Term Goal 1 (Week 1): Pt will be able to ambulate to bathroom with RW with CGA. OT Short Term Goal 1 - Progress (Week 1): Progressing toward goal OT Short Term Goal 2 (Week 1): Pt will complete stand pivot transfer wc to toilet with Supervision with RW. OT Short Term Goal 2 - Progress (Week 1): Progressing toward goal OT Short Term Goal 3 (Week 1): Pt will be able to don pants over feet with CGA. OT Short Term Goal 3 - Progress (Week 1): Progressing toward goal OT Short Term Goal 4 (Week 1): Pt will be able to pull pants over hips with CGA. OT Short Term Goal 4 - Progress (Week 1): Progressing toward goal  Skilled Therapeutic Interventions/Progress Updates:    Pt resting in bed upon arrival. Supine>sit EOB with min A and HOB elevated. Pt incontinent of bladder (unaware). Sit<>stand from EOB with min A. Pt assisted with hygiene. Brief donned. Pt threaded BLE into pants and stood from EOB to pull pants over hips. Pt required assistance to pull pants over hips. Once pt stood up, he reported he needed to change sock on prosthesis. Pt returned to EOB. Sit<>stand with CGA. Pt amb with RW to recliner to eat breakfast at end of session. Pt remained in recliner with belt alarm activated. All needs within reach.   Therapy Documentation Precautions:  Precautions Precautions: Fall, Other (comment) Precaution Comments: h/o L BKA (has prosthetic), L LE WBAT, no active hip abduction Restrictions Weight Bearing Restrictions: No LLE Weight Bearing: Weight bearing as tolerated Other Position/Activity Restrictions: no active hip abduction Pain:  Pt reports NO pain at rest; pt c/o "some" pain when standing and with amb, but "better then yesterday";  repositioned    Therapy/Group: Individual Therapy  Rich Brave 12/11/2022, 8:14 AM

## 2022-12-12 DIAGNOSIS — F4320 Adjustment disorder, unspecified: Secondary | ICD-10-CM

## 2022-12-12 DIAGNOSIS — K5900 Constipation, unspecified: Secondary | ICD-10-CM

## 2022-12-12 DIAGNOSIS — M25552 Pain in left hip: Secondary | ICD-10-CM

## 2022-12-12 DIAGNOSIS — R339 Retention of urine, unspecified: Secondary | ICD-10-CM

## 2022-12-12 DIAGNOSIS — S72112D Displaced fracture of greater trochanter of left femur, subsequent encounter for closed fracture with routine healing: Principal | ICD-10-CM

## 2022-12-12 NOTE — Progress Notes (Signed)
Patient continues to have blood-tinge urine due to prior catheterizations. MD aware. Prior PA aware.    Tilden Dome, LPN

## 2022-12-12 NOTE — Progress Notes (Signed)
PROGRESS NOTE   Subjective/Complaints:   New complaints or concerns this morning.  Reports he slept well.  He had straight cath this morning.  Reports he had a bowel movement today.  Reports left hip pain is a little better today.   ROS:   Pt denies SOB, abd pain, chills, CP, N/V/C/D, headache and vision changes Requiring in/out caths  Except for HPI  Objective:   No results found. No results for input(s): "WBC", "HGB", "HCT", "PLT" in the last 72 hours.  No results for input(s): "NA", "K", "CL", "CO2", "GLUCOSE", "BUN", "CREATININE", "CALCIUM" in the last 72 hours.   Intake/Output Summary (Last 24 hours) at 12/12/2022 1209 Last data filed at 12/12/2022 0700 Gross per 24 hour  Intake 1070 ml  Output 1200 ml  Net -130 ml         Physical Exam: Vital Signs Blood pressure (!) 131/50, pulse 68, temperature 98.4 F (36.9 C), temperature source Oral, resp. rate 18, height 5\' 5"  (1.651 m), weight 76.6 kg, SpO2 97 %.          General: awake, alert, appropriate, NAD, sitting in wheelchair HENT: conjugate gaze; oropharynx moist CV: bradycardic rate; no JVD Pulmonary: CTA B/L; no W/R/R- good air movement GI: soft, NT, ND, (+)BS- more normoactive Psychiatric: appropriate-pleasant Neurological: Ox3 Skin: L BKA healed- no signs of redness on tip where foam dressings are- or anywhere else on L BKA - has large deep purple bruise on posterior aspect of L distal thigh. -Not visualized today Musculoskeletal: wearing L BKA prosthesis     Cervical back: Normal range of motion.     Comments: Mild swelling LLE. Left hip with significant tenderness with movement and palpation.   TTP around L hip where has fx. -reports this is improved today Skin:    General: Skin is warm and dry.  Neurological:     Mental Status: He is alert.     Comments: Alert and oriented x 3. Normal insight and awareness. Intact Memory. Normal language  and speech. Cranial nerve exam unremarkable. MMT: UE 5/5. RLE 4/5. LLE 3/5 HF, KE d/t pain. Sensory exam normal for light touch and pain in all 4 limbs. No limb ataxia or cerebellar signs. No abnormal tone appreciated.  .     Assessment/Plan: 1. Functional deficits which require 3+ hours per day of interdisciplinary therapy in a comprehensive inpatient rehab setting. Physiatrist is providing close team supervision and 24 hour management of active medical problems listed below. Physiatrist and rehab team continue to assess barriers to discharge/monitor patient progress toward functional and medical goals  Care Tool:  Bathing    Body parts bathed by patient: Right arm, Left arm, Chest, Abdomen, Front perineal area, Right upper leg, Left upper leg   Body parts bathed by helper: Right lower leg, Buttocks Body parts n/a: Left lower leg   Bathing assist Assist Level: Moderate Assistance - Patient 50 - 74%     Upper Body Dressing/Undressing Upper body dressing   What is the patient wearing?: Pull over shirt    Upper body assist Assist Level: Set up assist    Lower Body Dressing/Undressing Lower body dressing  What is the patient wearing?: Incontinence brief, Pants     Lower body assist Assist for lower body dressing: Moderate Assistance - Patient 50 - 74%     Toileting Toileting    Toileting assist Assist for toileting: Maximal Assistance - Patient 25 - 49%     Transfers Chair/bed transfer  Transfers assist  Chair/bed transfer activity did not occur: Safety/medical concerns  Chair/bed transfer assist level: Moderate Assistance - Patient 50 - 74% Chair/bed transfer assistive device: Walker, Archivist   Ambulation assist   Ambulation activity did not occur: Safety/medical concerns  Assist level: Contact Guard/Touching assist Assistive device: Walker-rolling Max distance: 63ft   Walk 10 feet activity   Assist  Walk 10 feet activity  did not occur: Safety/medical concerns  Assist level: Contact Guard/Touching assist Assistive device: Walker-rolling   Walk 50 feet activity   Assist Walk 50 feet with 2 turns activity did not occur: Safety/medical concerns         Walk 150 feet activity   Assist Walk 150 feet activity did not occur: Safety/medical concerns         Walk 10 feet on uneven surface  activity   Assist Walk 10 feet on uneven surfaces activity did not occur: Safety/medical concerns         Wheelchair     Assist Is the patient using a wheelchair?: Yes Type of Wheelchair: Manual    Wheelchair assist level: Supervision/Verbal cueing Max wheelchair distance: 212ft    Wheelchair 50 feet with 2 turns activity    Assist        Assist Level: Supervision/Verbal cueing   Wheelchair 150 feet activity     Assist      Assist Level: Supervision/Verbal cueing   Blood pressure (!) 131/50, pulse 68, temperature 98.4 F (36.9 C), temperature source Oral, resp. rate 18, height 5\' 5"  (1.651 m), weight 76.6 kg, SpO2 97 %.  Medical Problem List and Plan: 1. Functional deficits secondary to left greater trochanter fracture             -pt with prior left BKA and prosthesis- incorporate donning/doffing, use of prosthetic into therapy plan/goals. Socket a little tighter than usual due to sl swelling in LLE from fracture and not wearing prosthesis as much.             -patient may  shower             -ELOS/Goals: 10 days, mod I to supervision with PT, OT   D/c- 5/10  Con't CIR PT and OT 2.  Antithrombotics: -DVT/anticoagulation:  Pharmaceutical: Eliquis             -antiplatelet therapy:    3. Pain Management: Tylenol as needed for mild pain             -hydrocodone prn for severe pain             -robaxin prn for spasms             -prn ice   4/27- pain usually controlled per pt- con't regimen- see how he does in therapies  4/30- Pain this AM- waiting for pain meds-   -5/4  reports pain is doing a lot better left hip 4. Mood/Behavior/Sleep: LCSW to evaluate and provide emotional support 5/2- insomnia- called pharmacy- will start Restoril 7.5 mg QHS prn  5/3- slept much better- wants ot con't Restoril             -  antipsychotic agents: n/a   -5/4 seen by neuropsych for coping and adjustment 5. Neuropsych/cognition: This patient is capable of making decisions on his own behalf.   6. Skin/Wound Care: Routine skin care checks   7. Fluids/Electrolytes/Nutrition: Routine Is and Os and follow-up chemistries             -continue vitamin B12 supplements, oral irom   8: Hypertension: monitor TID and prn (lasix 40 mg not restarted)             -continue amlodipine 10 mg daily             -continue atenolol 25 mg daily             -continue hydralazine 75 mg BID   4/27- BP somewhat elevated in 160s this Am systolic- usually 140s systolic- will monitor for trend for another 24 hours before changing meds  4/28-4/29 BP doing better- con't regimen  5/3- BP very slightly elevated usually controlled- could be pain? Will monitor trend  -5/4 BP controlled today, continue to monitor      12/12/2022    8:00 AM 12/12/2022    4:08 AM 12/11/2022    7:26 PM  Vitals with BMI  Systolic  131 137  Diastolic  50 53  Pulse 68 47 54    9: Hyperlipidemia: continue statin   10: Atrial fibrillation, chronic: rate controlled; continue Eliquis, BB   11: CAD: stable; s/p CABG 2000; balloon angioplasty 2003             -follows with Dr. Tenny Craw   12: OSA:    13: Anemia: continue oral iron; follow-up CBC   14: Left greater trochanter displacement; non-opertive management             -WBAT   5/1- pain really impacting therapy -  5/3- pain doing better per pt/staff 15: Urinary retention/BPH: continue Proscar and Flomax             -monitor output/PVR's             -OOB to void when possible  4/28- Changed Flomax to q supper- also on Proscar- having bleeding form penis- could be why had  blood clots- UTI can explain, but will recheck labs in AM to make sure Hb stable- so far, is 9.9 and stable from prior days. 4/29-  Hb has dropped some 9.3- will recheck in Am and if drops again, will call Urology.   4/30- Hb up to 10.1 and urine now clear 5/2- required a cath last night/this AM - x1- on max dose of Flomax- will add Urecholine 5 mg TID to help-   5/3- per pt, has hx of urinary retention- not emptying- for "months"- asked nursing and wrote order to teach pt in/out caths. Could increase Urecholine this weekend if need be.  16: DM-2: CBGs QID; carb modified diet; A1c 4.8% on 4/08 (diet controlled at home)             -on SSI but not requiring coverage   4/27- CBG's- 98-171- usually below 130- will reduce CBGs to BID-AC since doing well and A1c is 4.8  5/2- stopped SSI/CBGs 17: AKI atop CKD 3: baseline creatinine ~1.3 ??             -follow-up BMP  4/27- will recheck Sunday AM and con't to check weekly   4/28- Cr up slightly to 1.41 from 1.34- baseline Cr 1.3- pt drinking ~ 3 cups/water /day- asked him to drink  6-8 cups/day and will recheck in AM  4/29- Cr stable at 1.4- will con't topush fluids by [facility] Thursday  4/30- will recheck in AM since BUN/Cr slightly rising.   5/1- Cr down to 1.33 and BUN up to 35- likely dry- since was scared of being cathed.   5/3- Labs Monday 18: PAD: s/p left BKA 2019 Dr. Wyn Quaker             -continue Trental   19: Leukocytosis: afebrile; on Zosyn for UTI, started today 4/26             -follow-up sensitives -follow-up CBC 4/27- will recheck in AM/Sunday - is afebrile 4/28- WBC down to 10.5 from 12.4 4/29- WBC down to 9.7k 4/30- WBC back up to 12.9- likely because didn't get Cipro treatment as recommended- will recheck in AM 21: Pseudomonas (and E Coli) UTI             -continue Zosyn iv x 3 days at least then po cipro x 3 more days per [facility]  4/29- changing to Cipro today- having blood clots and sediment in urine- likely due to UTI  4/30-  Cipro didn't start yesterday when Zosyn stopped for some reason- will start- since WBC up to 12.9- -will recheck labs in AM  5/1- WBC down to 10.7- so appears like right medicine  22. Urinary retention  4/28- started Flomax- needed in/out cath due to retention- likely due to UTI and constipation  4/29- peed this Am; required cath last night-overnight- still has blood clots- per documentation. But voiding better  4/30- no cath since yesterday am and pt reports not bloody when pees.   5/3- still requiring caths- will start teaching- and let SW know will likely go home cathing.   5/4 continues to require IC, continue current plan 23. Constipation  4/28- pt has stool ball, it sounds like- trying to break up- also ordered Sorbitol 45cc this AM- and asked nursing to do digital disimpaction if need be.   4/29- LBM yesterday x2- feels better- will increase bowel meds- add Senokot-S 1 tab BID  4/30- LBM yesterday  5/2- LBM 4/30- nursing ot give Sorbitol today after therapy-will add Miralax to meds  5/3- LBM yesterday x2 with sorbitol-   5/4 LBM today improved 24. L BKA- old  5/1- had some redness 4/30 so foam placed- looks better today- spoke with PT- will con't to keep an eye on it- pain is doing better  5/2- skin looks great this AM- no pain on "nub".      LOS: 8 days A FACE TO FACE EVALUATION WAS PERFORMED  Fanny Dance 12/12/2022, 12:09 PM

## 2022-12-12 NOTE — Progress Notes (Signed)
Occupational Therapy Session Note  Patient Details  Name: Eric [name]. MRN: [id number] Date of Birth: [birth year]  Today's Date: 12/12/2022 OT Individual Time: 0100-0215 OT Individual Time Calculation (min): 75 min    Short Term Goals: Week 1:  OT Short Term Goal 1 (Week 1): Pt will be able to ambulate to bathroom with RW with CGA. OT Short Term Goal 1 - Progress (Week 1): Progressing toward goal OT Short Term Goal 2 (Week 1): Pt will complete stand pivot transfer wc to toilet with Supervision with RW. OT Short Term Goal 2 - Progress (Week 1): Progressing toward goal OT Short Term Goal 3 (Week 1): Pt will be able to don pants over feet with CGA. OT Short Term Goal 3 - Progress (Week 1): Progressing toward goal OT Short Term Goal 4 (Week 1): Pt will be able to pull pants over hips with CGA. OT Short Term Goal 4 - Progress (Week 1): Progressing toward goal  Skilled Therapeutic Interventions/Progress Updates:    The pt was finishing up with toileting and was able to have a bowel mvmt, nursing was made aware. The pt iwent on to indicate that he rested well and, he had a pain response of a 5 on a 0-10 scale associated with her L hip which he reported to nursing prior to my arrival.   The pt was transported back to his living quarters using the stedy with MinA.  The pt was instructed in simulated task in LB dressing using medium grade theraband to improve his compliance in relation to LB dressing.  The pt was instructed to donn the LLE first using the opposite hand to improve reach.  The pt was able to demonstrate carryover at [facility] secondary to challenges with lifting the prosthetic foot for clearance, the pt completed the task 3x.     The pt went on to practice coming from using the [facility] with CGA.  The pt was transported to the gym and was able to complete UB exercises. The pt indicated that he rested well and had a pain response of a 5 on a 0-10 scale, which was reported as well. The pt went  on to complete towel exercises 2 sets of 15 for shld flexion, horizontal abduction, shld rotation and large circles.  The pt was instructed in relaxation breathing to improve compliance. The pt required 1 rest break with each exercise. The pt went on to complete a resistive exercise maintaining the shld in flexion with external force exerted on the 1lb dowel while BUE were maintained in  shld flexion , 4x for a count of 10, the pt required 1 rest break with each set. The pt complete a resistive exercise using the 1lb and maintaining the position of the dowel in shld flexion with external force exerted on the dowel .  The pt maintained position 4x for a count of 10 with 1 rest break following each set.   At the end of the session, the pt was returned to his room and remained at w/c LOF with his call light and bedside table within reach.  All additional needs of the pt were addressed prior to exiting the room.  Therapy Documentation Precautions:  Precautions Precautions: Fall, Other (comment) Precaution Comments: h/o L BKA (has prosthetic), L LE WBAT, no active hip abduction Restrictions Weight Bearing Restrictions: No LLE Weight Bearing: Weight bearing as tolerated Other Position/Activity Restrictions: no active hip abduction  Therapy/Group: Individual Therapy  Lavona Mound 12/12/2022, 3:49 PM

## 2022-12-12 NOTE — Progress Notes (Signed)
Physical Therapy Session Note  Patient Details  Name: Eric [name]. MRN: [id number] Date of Birth: 06-09-1928  Today's Date: 12/12/2022 PT Individual Time: 0731-0813 PT Individual Time Calculation (min): 42 min   Short Term Goals: Week 1:  PT Short Term Goal 1 (Week 1): Pt will perform supine<>sit with no more than CGA PT Short Term Goal 2 (Week 1): Pt will perform sit<>stands using LRAD with no more than CGA PT Short Term Goal 3 (Week 1): Pt will perform bed<>chair transfers using LRAD with CGA PT Short Term Goal 4 (Week 1): Pt will ambulate at least 85ft using LRAD with CGA  Skilled Therapeutic Interventions/Progress Updates:   Received pt sitting on commode with NT present - PT took over with care. Pt agreeable to PT treatment and reported pain 5/10 in L hip - RN notified of request for pain medicine and present at end of session. [employer] brought prosthetic back with holes cut out along L lateral aspect of limb and at tibia. Pt continent of bowel and bladder and stood in [place] (already in front of pt) with CGA and dependent for pericare. Pt transferred to [facility] dependently in [place] and donned pants sitting in WC with max A. Stood from [facility] with RW and CGA and required max A to pull pants over hips. In hallway, pt stood with RW and CGA and ambulated 2ft with RW and CGA. Pt limited by pain in L hip but reported that the pain in L residual limb was "much better this morning than it has been" and that since [employer] made those modifications "standing and sitting back down has been easier".   Worked on blocked practice sit<>stands at sink with 1 UE support fading to no UE support x 5 reps with CGA for balance, once in standing worked on weight shifting onto LLE without UE support - pt reports this is the best his balance has been and that he hasn't been able to put that much weight on his L leg since his first few days of therapy. Pt very pleased with progress this morning. Pt performed WC mobility  ~258ft using BUE and supervision/mod I with emphasis on UE strength/endurance. Returned to room and concluded session with pt sitting in WC, needs within reach, and seatbelt alarm on.   Therapy Documentation Precautions:  Precautions Precautions: Fall, Other (comment) Precaution Comments: h/o L BKA (has prosthetic), L LE WBAT, no active hip abduction Restrictions Weight Bearing Restrictions: No LLE Weight Bearing: Weight bearing as tolerated Other Position/Activity Restrictions: no active hip abduction  Therapy/Group: Individual Therapy Martin Majestic PT, DPT  12/12/2022, 6:50 AM

## 2022-12-13 DIAGNOSIS — R001 Bradycardia, unspecified: Secondary | ICD-10-CM

## 2022-12-13 DIAGNOSIS — R319 Hematuria, unspecified: Secondary | ICD-10-CM

## 2022-12-13 MED ORDER — ATENOLOL 25 MG PO TABS
12.5000 mg | ORAL_TABLET | Freq: Every day | ORAL | Status: DC
  Administered 2022-12-14 – 2022-12-18 (×5): 12.5 mg via ORAL
  Filled 2022-12-13 (×5): qty 1

## 2022-12-13 NOTE — Progress Notes (Signed)
PROGRESS NOTE   Subjective/Complaints:   Pt reports he worked hard with PT this AM.  Reports continued hip pain during therapy but overall controlled with norco. Walked about 50 feet with RW today.  Had BM today and yesterday.  Had some blood tinged urine with IC yesterday, today recorded as yellow/straw.    ROS:   Pt denies SOB, abd pain, chills, CP, N/V/C/D, headache and vision changes Blood tinged urine after IC, denies dysuria Requiring in/out caths  Except for HPI  Objective:   No results found. No results for input(s): "WBC", "HGB", "HCT", "PLT" in the last 72 hours.  No results for input(s): "NA", "K", "CL", "CO2", "GLUCOSE", "BUN", "CREATININE", "CALCIUM" in the last 72 hours.   Intake/Output Summary (Last 24 hours) at 12/13/2022 1514 Last data filed at 12/13/2022 1309 Gross per 24 hour  Intake 320 ml  Output 950 ml  Net -630 ml         Physical Exam: Vital Signs Blood pressure (!) 132/53, pulse (!) 50, temperature 97.6 F (36.4 C), resp. rate 18, height 5\' 5"  (1.651 m), weight 76.6 kg, SpO2 100 %.          General: awake, alert, appropriate, NAD, sitting in wheelchair HENT: conjugate gaze; oropharynx moist CV: bradycardic rate; no JVD Pulmonary: CTA B/L; no W/R/R- good air movement GI: soft, NT, ND, (+)BS- more normoactive Psychiatric: appropriate-pleasant Neurological: Ox3 Skin: L BKA healed- no signs of redness on tip where foam dressings are- or anywhere else on L BKA - has large deep purple bruise on posterior aspect of L distal thigh. -Not visualized today, wearing L BKA prosthesis  Musculoskeletal:     Cervical back: Normal range of motion.     Comments: Mild swelling LLE. Left hip with significant tenderness with movement and palpation.   TTP around L hip where has fx. -reports this is gradually improving Skin:    General: Skin is warm and dry.  Neurological:     Mental Status: He is  alert.     Comments: Alert and oriented x 3. Normal insight and awareness. Follows commands, Intact Memory. Normal language and speech. Cranial nerve exam unremarkable. MMT: UE 5/5. RLE 4/5. LLE 3/5 HF, KE d/t pain. Sensory exam normal for light touch and pain in all 4 limbs. No limb ataxia or cerebellar signs. No abnormal tone appreciated.  .     Assessment/Plan: 1. Functional deficits which require 3+ hours per day of interdisciplinary therapy in a comprehensive inpatient rehab setting. Physiatrist is providing close team supervision and 24 hour management of active medical problems listed below. Physiatrist and rehab team continue to assess barriers to discharge/monitor patient progress toward functional and medical goals  Care Tool:  Bathing    Body parts bathed by patient: Right arm, Left arm, Chest, Abdomen, Front perineal area, Right upper leg, Left upper leg   Body parts bathed by helper: Right lower leg, Buttocks Body parts n/a: Left lower leg   Bathing assist Assist Level: Moderate Assistance - Patient 50 - 74%     Upper Body Dressing/Undressing Upper body dressing   What is the patient wearing?: Pull over shirt    Upper body  assist Assist Level: Set up assist    Lower Body Dressing/Undressing Lower body dressing      What is the patient wearing?: Incontinence brief, Pants     Lower body assist Assist for lower body dressing: Moderate Assistance - Patient 50 - 74%     Toileting Toileting    Toileting assist Assist for toileting: Maximal Assistance - Patient 25 - 49%     Transfers Chair/bed transfer  Transfers assist  Chair/bed transfer activity did not occur: Safety/medical concerns  Chair/bed transfer assist level: Contact Guard/Touching assist Chair/bed transfer assistive device: Geologist, engineering   Ambulation assist   Ambulation activity did not occur: Safety/medical concerns  Assist level: Contact Guard/Touching assist Assistive  device: Walker-rolling Max distance: 50   Walk 10 feet activity   Assist  Walk 10 feet activity did not occur: Safety/medical concerns  Assist level: Contact Guard/Touching assist Assistive device: Walker-rolling   Walk 50 feet activity   Assist Walk 50 feet with 2 turns activity did not occur: Safety/medical concerns  Assist level: Contact Guard/Touching assist Assistive device: Walker-rolling    Walk 150 feet activity   Assist Walk 150 feet activity did not occur: Safety/medical concerns         Walk 10 feet on uneven surface  activity   Assist Walk 10 feet on uneven surfaces activity did not occur: Safety/medical concerns         Wheelchair     Assist Is the patient using a wheelchair?: Yes Type of Wheelchair: Manual    Wheelchair assist level: Supervision/Verbal cueing Max wheelchair distance: 150    Wheelchair 50 feet with 2 turns activity    Assist        Assist Level: Supervision/Verbal cueing   Wheelchair 150 feet activity     Assist      Assist Level: Supervision/Verbal cueing   Blood pressure (!) 132/53, pulse (!) 50, temperature 97.6 F (36.4 C), resp. rate 18, height 5\' 5"  (1.651 m), weight 76.6 kg, SpO2 100 %.  Medical Problem List and Plan: 1. Functional deficits secondary to left greater trochanter fracture             -pt with prior left BKA and prosthesis- incorporate donning/doffing, use of prosthetic into therapy plan/goals. Socket a little tighter than usual due to sl swelling in LLE from fracture and not wearing prosthesis as much.             -patient may  shower             -ELOS/Goals: 10 days, mod I to supervision with PT, OT   D/c- 5/10  Con't CIR PT and OT  -Walked 16ft with RW with PT today 2.  Antithrombotics: -DVT/anticoagulation:  Pharmaceutical: Eliquis             -antiplatelet therapy:    3. Pain Management: Tylenol as needed for mild pain             -hydrocodone prn for severe pain              -robaxin prn for spasms             -prn ice   4/27- pain usually controlled per pt- con't regimen- see how he does in therapies  4/30- Pain this AM- waiting for pain meds-   -5/5 hip pain controlled with hydrocodone, reports gradually improving 4. Mood/Behavior/Sleep: LCSW to evaluate and provide emotional support 5/2- insomnia- called pharmacy- will start Restoril 7.5  mg QHS prn  5/3- slept much better- wants ot con't Restoril             -antipsychotic agents: n/a   -5/4 seen by neuropsych for coping and adjustment 5. Neuropsych/cognition: This patient is capable of making decisions on his own behalf.   6. Skin/Wound Care: Routine skin care checks   7. Fluids/Electrolytes/Nutrition: Routine Is and Os and follow-up chemistries             -continue vitamin B12 supplements, oral irom   8: Hypertension: monitor TID and prn (lasix 40 mg not restarted)             -continue amlodipine 10 mg daily             -continue atenolol 25 mg daily             -continue hydralazine 75 mg BID   4/27- BP somewhat elevated in 160s this Am systolic- usually 140s systolic- will monitor for trend for another 24 hours before changing meds  4/28-4/29 BP doing better- con't regimen  5/3- BP very slightly elevated usually controlled- could be pain? Will monitor trend  -5/5 BP controlled but a little soft at times, dc atenolol to 12.5mg  daily      12/13/2022    1:05 PM 12/13/2022    3:52 AM 12/12/2022    7:22 PM  Vitals with BMI  Systolic 132 135 161  Diastolic 53 61 51  Pulse 50 56 53    9: Hyperlipidemia: continue statin   10: Atrial fibrillation, chronic: rate controlled; continue Eliquis, BB  -5/5 HR bradycardic at times-appears to be somewhat chronic, will decrease atenolol to 12.5   11: CAD: stable; s/p CABG 2000; balloon angioplasty 2003             -follows with Dr. Tenny Craw   12: OSA:    13: Anemia: continue oral iron; follow-up CBC   14: Left greater trochanter displacement; non-opertive  management             -WBAT   5/1- pain really impacting therapy -  5/3- pain doing better per pt/staff 15: Urinary retention/BPH: continue Proscar and Flomax             -monitor output/PVR's             -OOB to void when possible  4/28- Changed Flomax to q supper- also on Proscar- having bleeding form penis- could be why had blood clots- UTI can explain, but will recheck labs in AM to make sure Hb stable- so far, is 9.9 and stable from prior days. 4/29-  Hb has dropped some 9.3- will recheck in Am and if drops again, will call Urology.   4/30- Hb up to 10.1 and urine now clear 5/2- required a cath last night/this AM - x1- on max dose of Flomax- will add Urecholine 5 mg TID to help-   5/3- per pt, has hx of urinary retention- not emptying- for "months"- asked nursing and wrote order to teach pt in/out caths. Could increase Urecholine this weekend if need be.   5/5 will hold off on increase urecholine today as intermittently bradycardic, blood tinged urine/hematuria with traumatic cath yesterday-appears improving. Try coude cath for IC 16: DM-2: CBGs QID; carb modified diet; A1c 4.8% on 4/08 (diet controlled at home)             -on SSI but not requiring coverage   4/27- CBG's- 98-171- usually below 130- will reduce  CBGs to BID-AC since doing well and A1c is 4.8  5/2- stopped SSI/CBGs 17: AKI atop CKD 3: baseline creatinine ~1.3 ??             -follow-up BMP  4/27- will recheck Sunday AM and con't to check weekly   4/28- Cr up slightly to 1.41 from 1.34- baseline Cr 1.3- pt drinking ~ 3 cups/water /day- asked him to drink 6-8 cups/day and will recheck in AM  4/29- Cr stable at 1.4- will con't topush fluids by [facility] Thursday  4/30- will recheck in AM since BUN/Cr slightly rising.   5/1- Cr down to 1.33 and BUN up to 35- likely dry- since was scared of being cathed.   5/3- Labs Monday 18: PAD: s/p left BKA 2019 Dr. Wyn Quaker             -continue Trental   19: Leukocytosis: afebrile; on  Zosyn for UTI, started today 4/26             -follow-up sensitives -follow-up CBC 4/27- will recheck in AM/Sunday - is afebrile 4/28- WBC down to 10.5 from 12.4 4/29- WBC down to 9.7k 4/30- WBC back up to 12.9- likely because didn't get Cipro treatment as recommended- will recheck in AM 21: Pseudomonas (and E Coli) UTI             -continue Zosyn iv x 3 days at least then po cipro x 3 more days per [facility]  4/29- changing to Cipro today- having blood clots and sediment in urine- likely due to UTI  4/30- Cipro didn't start yesterday when Zosyn stopped for some reason- will start- since WBC up to 12.9- -will recheck labs in AM  5/1- WBC down to 10.7- so appears like right medicine  22. Urinary retention  4/28- started Flomax- needed in/out cath due to retention- likely due to UTI and constipation  4/29- peed this Am; required cath last night-overnight- still has blood clots- per documentation. But voiding better  4/30- no cath since yesterday am and pt reports not bloody when pees.   5/3- still requiring caths- will start teaching- and let SW know will likely go home cathing.   5/4 continues to require IC, continue current plan 23. Constipation  4/28- pt has stool ball, it sounds like- trying to break up- also ordered Sorbitol 45cc this AM- and asked nursing to do digital disimpaction if need be.   4/29- LBM yesterday x2- feels better- will increase bowel meds- add Senokot-S 1 tab BID  4/30- LBM yesterday  5/2- LBM 4/30- nursing ot give Sorbitol today after therapy-will add Miralax to meds  5/3- LBM yesterday x2 with sorbitol-   5/4 LBM today improved 24. L BKA- old  5/1- had some redness 4/30 so foam placed- looks better today- spoke with PT- will con't to keep an eye on it- pain is doing better  5/2- skin looks great this AM- no pain on "nub".      LOS: 9 days A FACE TO FACE EVALUATION WAS PERFORMED  Fanny Dance 12/13/2022, 3:14 PM

## 2022-12-13 NOTE — Progress Notes (Signed)
Physical Therapy Session Note  Patient Details  Name: Eric [name]. MRN: [id number] Date of Birth: September 09, 1927  Today's Date: 12/13/2022 PT Individual Time: 1000-1059 PT Individual Time Calculation (min): 59 min   Short Term Goals: Week 1:  PT Short Term Goal 1 (Week 1): Pt will perform supine<>sit with no more than CGA PT Short Term Goal 1 - Progress (Week 1): Progressing toward goal PT Short Term Goal 2 (Week 1): Pt will perform sit<>stands using LRAD with no more than CGA PT Short Term Goal 2 - Progress (Week 1): Met PT Short Term Goal 3 (Week 1): Pt will perform bed<>chair transfers using LRAD with CGA PT Short Term Goal 3 - Progress (Week 1): Met PT Short Term Goal 4 (Week 1): Pt will ambulate at least 84ft using LRAD with CGA PT Short Term Goal 4 - Progress (Week 1): Met  Skilled Therapeutic Interventions/Progress Updates:  Pt was seen bedside in the am. Pt propelled w/c about 150 feet with B UEs and S. Pt performed multiple sit to stand and stand pivot transfers with rolling walker and c/g. Pt transferred edge of mat to supine and supine to edge of mat with c/g. Pt maintain balance on edge of mat with S. Pt performed B heel slides, R hip abd/add, and LAQs (on edge of mat), 3 sets x 10 reps each. Pt ambulated 30 and 50 feet with rolling walker and c/g. Pt returned to room following treatment and left sitting up in chair alarm on and all needs within reach.   Therapy Documentation Precautions:  Precautions Precautions: Fall, Other (comment) Precaution Comments: h/o L BKA (has prosthetic), L LE WBAT, no active hip abduction Restrictions Weight Bearing Restrictions: No LLE Weight Bearing: Weight bearing as tolerated Other Position/Activity Restrictions: no active hip abduction General:  Pain: Pt c/o 3/10 L hip pain at beginning of therapy, increased to 7/10 with ambulation, was 2/10 pain L hip at end of therapy.     Therapy/Group: Individual Therapy  Rayford Halsted 12/13/2022, 12:13 PM

## 2022-12-13 NOTE — Progress Notes (Signed)
Unable to demonstrate I/O on patient as he has been voiding and PVR <350 but verbal explanation was attempted. Patient is encourage to continue using the urinal to urinate and call for assistance when needed.  Patient is in the chair eating dinner, alarm on, call-light within reach - other safety measures implemented.

## 2022-12-13 NOTE — Progress Notes (Signed)
Physical Therapy Weekly Progress Note  Patient Details  Name: Eric [name]. MRN: [id number] Date of Birth: Mar 09, 1928  Beginning of progress report period: December 05, 2022 End of progress report period: Dec 13, 2022  Today's Date: 12/13/2022 PT Individual Time: 0800-0900 PT Individual Time Calculation (min): 60 min   Patient has met 3 of 4 short term goals.  Patient is making steady progress to long term goals. Pt largely limited due to left hip and residual limb pain. Pt evaluated by prosthetist from [facility] and adjustments made to prosthesis to improve comfort and protect skin integrity. Pt largely requires CGA with STS and gait ~40-50 ft with RW. Plan to continue patient and family education to prepare for discharge.    Patient continues to demonstrate the following deficits muscle weakness, decreased cardiorespiratoy endurance, and decreased standing balance, decreased postural control, and decreased balance strategies and therefore will continue to benefit from skilled PT intervention to increase functional independence with mobility.  Patient progressing toward long term goals..  Continue plan of care.  PT Short Term Goals Week 1:  PT Short Term Goal 1 (Week 1): Pt will perform supine<>sit with no more than CGA PT Short Term Goal 1 - Progress (Week 1): Progressing toward goal PT Short Term Goal 2 (Week 1): Pt will perform sit<>stands using LRAD with no more than CGA PT Short Term Goal 2 - Progress (Week 1): Met PT Short Term Goal 3 (Week 1): Pt will perform bed<>chair transfers using LRAD with CGA PT Short Term Goal 3 - Progress (Week 1): Met PT Short Term Goal 4 (Week 1): Pt will ambulate at least 46ft using LRAD with CGA PT Short Term Goal 4 - Progress (Week 1): Met Week 2:  PT Short Term Goal 1 (Week 2): STG=LTG due to ELOS  Skilled Therapeutic Interventions/Progress Updates:      Therapy Documentation Precautions:  Precautions Precautions: Fall, Other  (comment) Precaution Comments: h/o L BKA (has prosthetic), L LE WBAT, no active hip abduction Restrictions Weight Bearing Restrictions: No LLE Weight Bearing: Weight bearing as tolerated Other Position/Activity Restrictions: no active hip abduction  Pt received seated edge of bed and with unrated left hip pain. Pt received pain medications prior to session and provided with rest and repositioning for relief.   Pt requests need to toilet and requires supervision for prosthesis management and CGA with STS from elevated bed with RW. PT provided verbal cues for hand placement on AD with transfer. Pt ambulated ~10 ft to toilet and continent of bowel and bladder, documented in flowsheet. Pt requires supervision for peri-care and CGA with STS as pt provided total A for donning of brief. Pt ambulated ~10 ft to w/c and provided with rest break due to increase in left hip pain prior to putting on pants. Pt requires max A for lower body dressing. Nurse arrived mid session and provided patient with morning medications. Pt performed hand hygiene and facial washing seated in w/c with supervision. Pt left seated in w/c at bedside with all needs in reach and chair alarm on.    Therapy/Group: Individual Therapy  [name] [name] PT, DPT  12/13/2022, 7:13 AM

## 2022-12-14 LAB — BASIC METABOLIC PANEL
Anion gap: 10 (ref 5–15)
BUN: 40 mg/dL — ABNORMAL HIGH (ref 8–23)
CO2: 19 mmol/L — ABNORMAL LOW (ref 22–32)
Calcium: 8.9 mg/dL (ref 8.9–10.3)
Chloride: 107 mmol/L (ref 98–111)
Creatinine, Ser: 1.49 mg/dL — ABNORMAL HIGH (ref 0.61–1.24)
GFR, Estimated: 44 mL/min — ABNORMAL LOW (ref >60)
Glucose, Bld: 91 mg/dL (ref 70–99)
Potassium: 4.9 mmol/L (ref 3.5–5.1)
Sodium: 136 mmol/L (ref 135–145)

## 2022-12-14 LAB — CBC
HCT: 30.3 % — ABNORMAL LOW (ref 39.0–52.0)
Hemoglobin: 9.8 g/dL — ABNORMAL LOW (ref 13.0–17.0)
MCH: 29.6 pg (ref 26.0–34.0)
MCHC: 32.3 g/dL (ref 30.0–36.0)
MCV: 91.5 fL (ref 80.0–100.0)
Platelets: 255 10*3/uL (ref 150–400)
RBC: 3.31 MIL/uL — ABNORMAL LOW (ref 4.22–5.81)
RDW: 16.4 % — ABNORMAL HIGH (ref 11.5–15.5)
WBC: 11.5 10*3/uL — ABNORMAL HIGH (ref 4.0–10.5)
nRBC: 0 % (ref 0.0–0.2)

## 2022-12-14 MED ORDER — FLUTICASONE PROPIONATE 50 MCG/ACT NA SUSP
1.0000 | Freq: Every day | NASAL | Status: DC
  Administered 2022-12-14 – 2022-12-18 (×5): 1 via NASAL
  Filled 2022-12-14: qty 16

## 2022-12-14 NOTE — Progress Notes (Signed)
Physical Therapy Session Note  Patient Details  Name: Eric [name]. MRN: [id number] Date of Birth: 11/22/1927  Today's Date: 12/14/2022 PT Individual Time: 1115-1204, 1191-4782  PT Individual Time Calculation (min): 49 min , 56 min   Short Term Goals: Week 2:  PT Short Term Goal 1 (Week 2): STG=LTG due to ELOS  Skilled Therapeutic Interventions/Progress Updates:      Therapy Documentation Precautions:  Precautions Precautions: Fall, Other (comment) Precaution Comments: h/o L BKA (has prosthetic), L LE WBAT, no active hip abduction Restrictions Weight Bearing Restrictions: No LLE Weight Bearing: Weight bearing as tolerated Other Position/Activity Restrictions: no active hip abduction  Treatment Session 1:   Pt received seated in w/c at bedside and reports fatigue and agreeable to PT session with emphasis on gait training, car transfers and discharge planning. Pt transported total A for time management and energy conservation to main gym. PT engaged in discussion with patient regarding discharge planning. Pt reports he has no steps to enter home through basement and utilizes chair lift to access the upper level of his house. Pt attempted bed transfer from elevated height to simulate his home set-up and unable due to left hip pain, therefore pt plans to sleep in lift chair. Pt ambulated ~75 feet with RW and CGA with good speed and cadence. Pt reports left hip pain and provided with rest break for relief. Pt agreeable to practice car transfer and transported by w/c to ortho gym. Pt (S) for ambulatory transfer to car with RW. Pt limited 2/2 left hip pain and unable to mobilize L LE into car unless prosthesis removed. Pt requires supervision for prosthesis removal and for seated car transfer. Plan to donn/doff prosthesis for car transfer upon discharge. Pt left seated in w/c at bedside with son present and chair alarm on.    Treatment Session 2:   Pt received seated in w/c at bedside  with 2 son's present for family education. Pt with unrated hip pain, provided rest and repositioning for relief.   Pt propelled w/c ~120 ft (S) and transported remaining distance to ortho gym for energy conservation. PT educated family and family on optimal transfer set-up and sequencing.   Pt requires CGA with STS and gait ~5 ft to car. Pt sat down and removed L LE prosthesis and requires supervision and increased time for placement of LE's into car simulator. Pt requires min A for STS from car simulated height due to fatigue.   PT held discussion with patient and family regarding bed mobility. Pt's plan was to sleep in lift chair as his current bed is flat and elevated. Pt and family educated on adding bed rails and pt's son inquired if hospital bed would be feasible. PT informed SW.   Pt agreeable to UE exercises to prevent further hip pain and fatigue. Pt performed 3 x 12 chest press and 3 x 8 lateral raises with 4# dumb bells to address strength deficits.   Pt transported to room and left seated in w/c at bedside with all needs in reach and chair alarm on.   Therapy/Group: Individual Therapy  [name] [name] PT, DPT  12/14/2022, 7:46 AM

## 2022-12-14 NOTE — Progress Notes (Signed)
PROGRESS NOTE   Subjective/Complaints:   Pt seen sitting up in w/c at bedside.  Pt reports was choking a little when ate this AM, but "calmed down".  Basically has a lot of phlegm- also having sinus drainage.    Pain better, but not resolved. Walked 50 ft x2- with therapy Asking if can  stay on this hall- and wheel w/c down hall to nurses station and back- had been able, but then nursing wouldn't let him yesterday.   LBM x3 in last 24 hours- 2 small and 1 medium.  Cathed last yesterday early AM- not since- highest bladder scan 240s, but otherwise, pretty good.   ROS:  Pt denies SOB, abd pain, CP, N/V/C/D, and vision changes  Requiring in/out caths  Except for HPI  Objective:   No results found. Recent Labs    12/14/22 0435  WBC 11.5*  HGB 9.8*  HCT 30.3*  PLT 255   Recent Labs    12/14/22 0435  NA 136  K 4.9  CL 107  CO2 19*  GLUCOSE 91  BUN 40*  CREATININE 1.49*  CALCIUM 8.9    Intake/Output Summary (Last 24 hours) at 12/14/2022 1478 Last data filed at 12/14/2022 0400 Gross per 24 hour  Intake 800 ml  Output 150 ml  Net 650 ml        Physical Exam: Vital Signs Blood pressure (!) 144/66, pulse 65, temperature 97.7 F (36.5 C), resp. rate 18, height 5\' 5"  (1.651 m), weight 76.6 kg, SpO2 99 %.           General: awake, alert, appropriate, sitting up w/c eating breakfast - almost 100% tray; NAD HENT: conjugate gaze; oropharynx moist CV: regular rate; no JVD Pulmonary:CTA b/L- no W/R/R- has obvious sinus drainage- sounds nasal GI: soft, NT, ND, (+)BS Psychiatric: appropriate Neurological: Ox3 - has large deep purple bruise on posterior aspect of L distal thigh. -Not visualized today, wearing L BKA prosthesis  Musculoskeletal:     Cervical back: Normal range of motion.     Comments: Mild swelling LLE. Left hip with significant tenderness with movement and palpation.   TTP around L hip  where has fx. -reports this is gradually improving Skin:    General: Skin is warm and dry.  Neurological:     Mental Status: He is alert.     Comments: Alert and oriented x 3. Normal insight and awareness. Follows commands, Intact Memory. Normal language and speech. Cranial nerve exam unremarkable. MMT: UE 5/5. RLE 4/5. LLE 3/5 HF, KE d/t pain. Sensory exam normal for light touch and pain in all 4 limbs. No limb ataxia or cerebellar signs. No abnormal tone appreciated.  .     Assessment/Plan: 1. Functional deficits which require 3+ hours per day of interdisciplinary therapy in a comprehensive inpatient rehab setting. Physiatrist is providing close team supervision and 24 hour management of active medical problems listed below. Physiatrist and rehab team continue to assess barriers to discharge/monitor patient progress toward functional and medical goals  Care Tool:  Bathing    Body parts bathed by patient: Right arm, Left arm, Chest, Abdomen, Front perineal area, Right upper leg, Left upper leg  Body parts bathed by helper: Right lower leg, Buttocks Body parts n/a: Left lower leg   Bathing assist Assist Level: Moderate Assistance - Patient 50 - 74%     Upper Body Dressing/Undressing Upper body dressing   What is the patient wearing?: Pull over shirt    Upper body assist Assist Level: Set up assist    Lower Body Dressing/Undressing Lower body dressing      What is the patient wearing?: Incontinence brief, Pants     Lower body assist Assist for lower body dressing: Moderate Assistance - Patient 50 - 74%     Toileting Toileting    Toileting assist Assist for toileting: Maximal Assistance - Patient 25 - 49%     Transfers Chair/bed transfer  Transfers assist  Chair/bed transfer activity did not occur: Safety/medical concerns  Chair/bed transfer assist level: Contact Guard/Touching assist Chair/bed transfer assistive device: Civil engineer, contracting   Ambulation assist   Ambulation activity did not occur: Safety/medical concerns  Assist level: Contact Guard/Touching assist Assistive device: Walker-rolling Max distance: 50   Walk 10 feet activity   Assist  Walk 10 feet activity did not occur: Safety/medical concerns  Assist level: Contact Guard/Touching assist Assistive device: Walker-rolling   Walk 50 feet activity   Assist Walk 50 feet with 2 turns activity did not occur: Safety/medical concerns  Assist level: Contact Guard/Touching assist Assistive device: Walker-rolling    Walk 150 feet activity   Assist Walk 150 feet activity did not occur: Safety/medical concerns         Walk 10 feet on uneven surface  activity   Assist Walk 10 feet on uneven surfaces activity did not occur: Safety/medical concerns         Wheelchair     Assist Is the patient using a wheelchair?: Yes Type of Wheelchair: Manual    Wheelchair assist level: Supervision/Verbal cueing Max wheelchair distance: 150    Wheelchair 50 feet with 2 turns activity    Assist        Assist Level: Supervision/Verbal cueing   Wheelchair 150 feet activity     Assist      Assist Level: Supervision/Verbal cueing   Blood pressure (!) 144/66, pulse 65, temperature 97.7 F (36.5 C), resp. rate 18, height 5\' 5"  (1.651 m), weight 76.6 kg, SpO2 99 %.  Medical Problem List and Plan: 1. Functional deficits secondary to left greater trochanter fracture             -pt with prior left BKA and prosthesis- incorporate donning/doffing, use of prosthetic into therapy plan/goals. Socket a little tighter than usual due to sl swelling in LLE from fracture and not wearing prosthesis as much.             -patient may  shower             -ELOS/Goals: 10 days, mod I to supervision with PT, OT   D/c- 5/10  Con't CIR PT and OT Can wheel down hall on own since had been doing- but only to nurses station 2.   Antithrombotics: -DVT/anticoagulation:  Pharmaceutical: Eliquis             -antiplatelet therapy:    3. Pain Management: Tylenol as needed for mild pain             -hydrocodone prn for severe pain             -robaxin prn for spasms             -  prn ice   4/27- pain usually controlled per pt- con't regimen- see how he does in therapies  4/30- Pain this AM- waiting for pain meds-   -5/5 hip pain controlled with hydrocodone, reports gradually improving  5/6- pain doing better, not resolved- meds more helpful 4. Mood/Behavior/Sleep: LCSW to evaluate and provide emotional support 5/2- insomnia- called pharmacy- will start Restoril 7.5 mg QHS prn  5/3- slept much better- wants ot con't Restoril             -antipsychotic agents: n/a   -5/4 seen by neuropsych for coping and adjustment 5. Neuropsych/cognition: This patient is capable of making decisions on his own behalf.   6. Skin/Wound Care: Routine skin care checks   7. Fluids/Electrolytes/Nutrition: Routine Is and Os and follow-up chemistries             -continue vitamin B12 supplements, oral irom   8: Hypertension: monitor TID and prn (lasix 40 mg not restarted)             -continue amlodipine 10 mg daily             -continue atenolol 25 mg daily             -continue hydralazine 75 mg BID   4/27- BP somewhat elevated in 160s this Am systolic- usually 140s systolic- will monitor for trend for another 24 hours before changing meds  4/28-4/29 BP doing better- con't regimen  5/3- BP very slightly elevated usually controlled- could be pain? Will monitor trend  -5/5 BP controlled but a little soft at times, dc atenolol to 12.5mg  daily  5/6- BP a little elevated this AM-will monitor for trend    12/14/2022    5:05 AM 12/13/2022    7:54 PM 12/13/2022    1:05 PM  Vitals with BMI  Systolic 144 128 161  Diastolic 66 54 53  Pulse 65 52 50    9: Hyperlipidemia: continue statin   10: Atrial fibrillation, chronic: rate controlled;  continue Eliquis, BB  -5/5 HR bradycardic at times-appears to be somewhat chronic, will decrease atenolol to 12.5   11: CAD: stable; s/p CABG 2000; balloon angioplasty 2003             -follows with Dr. Tenny Craw   12: OSA:    13: Anemia: continue oral iron; follow-up CBC   14: Left greater trochanter displacement; non-opertive management             -WBAT   5/1- pain really impacting therapy -  5/3- pain doing better per pt/staff 15: Urinary retention/BPH: continue Proscar and Flomax             -monitor output/PVR's             -OOB to void when possible  4/28- Changed Flomax to q supper- also on Proscar- having bleeding form penis- could be why had blood clots- UTI can explain, but will recheck labs in AM to make sure Hb stable- so far, is 9.9 and stable from prior days. 4/29-  Hb has dropped some 9.3- will recheck in Am and if drops again, will call Urology.   4/30- Hb up to 10.1 and urine now clear 5/2- required a cath last night/this AM - x1- on max dose of Flomax- will add Urecholine 5 mg TID to help-   5/3- per pt, has hx of urinary retention- not emptying- for "months"- asked nursing and wrote order to teach pt in/out caths. Could increase Urecholine  this weekend if need be.   5/5 will hold off on increase urecholine today as intermittently bradycardic, blood tinged urine/hematuria with traumatic cath yesterday-appears improving. Try coude cath for IC 5/6- No cath since yesterday AM- voiding better 16: DM-2: CBGs QID; carb modified diet; A1c 4.8% on 4/08 (diet controlled at home)             -on SSI but not requiring coverage   4/27- CBG's- 98-171- usually below 130- will reduce CBGs to BID-AC since doing well and A1c is 4.8  5/2- stopped SSI/CBGs 17: AKI atop CKD 3: baseline creatinine ~1.3 ??             -follow-up BMP  4/27- will recheck Sunday AM and con't to check weekly   4/28- Cr up slightly to 1.41 from 1.34- baseline Cr 1.3- pt drinking ~ 3 cups/water /day- asked him to  drink 6-8 cups/day and will recheck in AM  4/29- Cr stable at 1.4- will con't topush fluids by [place] Thursday  4/30- will recheck in AM since BUN/Cr slightly rising.   5/1- Cr down to 1.33 and BUN up to 35- likely dry- since was scared of being cathed.   5/6- Cr up slightly to 1.49- think pt is drinking less to void less- will d/w pt  18: PAD: s/p left BKA 2019 Dr. Wyn Quaker             -continue Trental   19: Leukocytosis: afebrile; on Zosyn for UTI, started today 4/26             -follow-up sensitives -follow-up CBC 4/27- will recheck in AM/Sunday - is afebrile 4/28- WBC down to 10.5 from 12.4 4/29- WBC down to 9.7k 4/30- WBC back up to 12.9- likely because didn't get Cipro treatment as recommended- will recheck in AM 5/6- WBC up slightly to 11.5- will recheck Wednesday 21: Pseudomonas (and E Coli) UTI             -continue Zosyn iv x 3 days at least then po cipro x 3 more days per [facility]  4/29- changing to Cipro today- having blood clots and sediment in urine- likely due to UTI  4/30- Cipro didn't start yesterday when Zosyn stopped for some reason- will start- since WBC up to 12.9- -will recheck labs in AM  5/1- WBC down to 10.7- so appears like right medicine  22. Urinary retention  4/28- started Flomax- needed in/out cath due to retention- likely due to UTI and constipation  4/29- peed this Am; required cath last night-overnight- still has blood clots- per documentation. But voiding better  4/30- no cath since yesterday am and pt reports not bloody when pees.   5/3- still requiring caths- will start teaching- and let SW know will likely go home cathing.   5/4 continues to require IC, continue current plan 23. Constipation  4/28- pt has stool ball, it sounds like- trying to break up- also ordered Sorbitol 45cc this AM- and asked nursing to do digital disimpaction if need be.   4/29- LBM yesterday x2- feels better- will increase bowel meds- add Senokot-S 1 tab BID  4/30- LBM  yesterday  5/2- LBM 4/30- nursing ot give Sorbitol today after therapy-will add Miralax to meds  5/3- LBM yesterday x2 with sorbitol-   5/4 LBM today improved  5/6- LBM x3 yesterday 24. L BKA- old  5/1- had some redness 4/30 so foam placed- looks better today- spoke with PT- will con't to keep an eye on  it- pain is doing better  5/2- skin looks great this AM- no pain on "nub".  25. Sinus drainage  5/6- will add Flonase daily.   I spent a total of  38  minutes on total care today- >50% coordination of care- due to  D/w pt about needing to f/u with [facility] after d/c; also review of labs, determination of plan.    LOS: 10 days A FACE TO FACE EVALUATION WAS PERFORMED  Genee Rann 12/14/2022, 8:08 AM

## 2022-12-14 NOTE — Progress Notes (Signed)
Occupational Therapy Session Note  Patient Details  Name: Eric [name]. MRN: [id number] Date of Birth: [birth year]  Today's Date: 12/14/2022 OT Individual Time: 0916-1000 Session 1,   1303-1400 Session 2 OT Individual Time Calculation (min): 44 min, 57 min     Short Term Goals: Week 1:  OT Short Term Goal 1 (Week 1): Pt will be able to ambulate to bathroom with RW with CGA. OT Short Term Goal 1 - Progress (Week 1): Progressing toward goal OT Short Term Goal 2 (Week 1): Pt will complete stand pivot transfer wc to toilet with Supervision with RW. OT Short Term Goal 2 - Progress (Week 1): Progressing toward goal OT Short Term Goal 3 (Week 1): Pt will be able to don pants over feet with CGA. OT Short Term Goal 3 - Progress (Week 1): Progressing toward goal OT Short Term Goal 4 (Week 1): Pt will be able to pull pants over hips with CGA. OT Short Term Goal 4 - Progress (Week 1): Progressing toward goal Week 2:  OT Short Term Goal 1 (Week 2): STGs=LTGs due to patient's length of stay.  Skilled Therapeutic Interventions/Progress Updates:   Session 1:  Pt seen for skilled OT session. Pt up in recliner and requesting OT assist with BM and voiding. Pt performed sit to stand to RW with close S. Pt amb with RW with L LE prosthesis to and from bathroom. CGA clothing mngt, Peri and buttocks hygeien with min A. Stood sink side for hand washing with close S. Transported via w/c to and from demo apt space for energy conservation. Issued and trained in RW bag use for light item transport. Amb to pantry 15 ft x 2 with CGA and reach for items with min A for dyn balance. Pt left up in w/c as per request with chair pad alarm, needs and nurse call button in reach.   Pain: L hip 3/10 with decreased to 2/10 with repositioning     Session 2:  Pt seen for skilled OT with focus on Family Education. Pt in bathroom with NT upon OT arrival and OT upgraded safety plan to include pt amb with prosthesis and RW with  min A x 1 A versus STEDY unless fatigue or pain limitations. NT and nurse as well as PT in agreement. Safety plan upgraded. Pt amb from toilet to sink with CGA and sons present for education. Educ on skin inspection, issued skin inspection mirror and added thin stockinette under R shoe as pt did not have a sock on which risk breakdown. Use of gait belt with amb, RW bag for light item transport. OT transported pt with family to demo apt space for TTB transfer with son's assisting safely. Amb in kitchen with RW with CGA 20 ft x 2. Transported back to room and OT training continued with light UE tband therex with demo and understanding teach back. Left pt w/c level with chair alarm set, needs, and nurse call button in reach.    Pain: mild 2/10 L hip with heat applied and meds requested     Therapy Documentation Precautions:  Precautions Precautions: Fall, Other (comment) Precaution Comments: h/o L BKA (has prosthetic), L LE WBAT, no active hip abduction Restrictions Weight Bearing Restrictions: No LLE Weight Bearing: Weight bearing as tolerated Other Position/Activity Restrictions: no active hip abduction    Therapy/Group: Individual Therapy  Vicenta Dunning 12/14/2022, 7:57 AM

## 2022-12-14 NOTE — Progress Notes (Addendum)
Patient ID: Eric [name]., male   DOB: 12/30/1927, 89 y.o.   MRN: [id number] Referral made to [facility] for I & O cath's for discharge home. All four children to come in this week for hands on education. Will see them when here.  12:30 PM Son here awaiting his brother's arrival will be present for hands on education with Dad in therapies this afternoon. Sister's are coming Thursday. Discussed home health preference he had none, he does have a RN from [facility] come to monitor his chronic illnesses. Have made referral with [facility] to provide PT OT RN follow up. Pt is aware and agreeable to this  3:07 pm PT recommends getting a rental hospital bed for home for short time. Pt and son's are in agreement with this and worker has made referral to [facility] for hospital bed.

## 2022-12-14 NOTE — Progress Notes (Signed)
Restless night, mostly related to going to bathroom. At 2100, up to BR to void, scan=67cc's. [id number], up to BR, void and small type 6 BM, bladder scan=248.   At 0024, PRN restoril given per patient's request. At 0215, PRN vicodin given for complaint of left hip pain. Rating pain 7-8.   2-3 plus pitting edema to RLE. SCD applied. Alfredo Martinez A

## 2022-12-15 ENCOUNTER — Inpatient Hospital Stay (HOSPITAL_COMMUNITY): Payer: PPO

## 2022-12-15 NOTE — Progress Notes (Signed)
Physical Therapy Session Note  Patient Details  Name: Eric [name]. MRN: [id number] Date of Birth: 1928/02/05  Today's Date: 12/15/2022 PT Individual Time: 0733-0800 PT Individual Time Calculation (min): 27 min   Short Term Goals: Week 1:  PT Short Term Goal 1 (Week 1): Pt will perform supine<>sit with no more than CGA PT Short Term Goal 1 - Progress (Week 1): Progressing toward goal PT Short Term Goal 2 (Week 1): Pt will perform sit<>stands using LRAD with no more than CGA PT Short Term Goal 2 - Progress (Week 1): Met PT Short Term Goal 3 (Week 1): Pt will perform bed<>chair transfers using LRAD with CGA PT Short Term Goal 3 - Progress (Week 1): Met PT Short Term Goal 4 (Week 1): Pt will ambulate at least 48ft using LRAD with CGA PT Short Term Goal 4 - Progress (Week 1): Met Week 2:  PT Short Term Goal 1 (Week 2): STG=LTG due to ELOS  Skilled Therapeutic Interventions/Progress Updates:   Received pt sitting in WC finishing breakfast. Pt agreeable to PT treatment and reported pain 3/10 in L hip. Donned pants sitting in WC with max A for time management purposes and pt stood from [facility] at sink and CGA to pull pants over hip and rinse hands. Stood again in hallway with RW and close supervision and pt ambulated 141ft with RW and close supervision from room to dayroom - pt reported prosthetic feels "much better" and able to weight shift more onto LLE. MD arrived for morning rounds and pt left sitting in WC in dayroom - handoff to primary PT.   Therapy Documentation Precautions:  Precautions Precautions: Fall, Other (comment) Precaution Comments: h/o L BKA (has prosthetic), L LE WBAT, no active hip abduction Restrictions Weight Bearing Restrictions: Yes LLE Weight Bearing: Weight bearing as tolerated Other Position/Activity Restrictions: no active hip abduction  Therapy/Group: Individual Therapy Marlana Salvage Zaunegger Blima Rich PT, DPT 12/15/2022, 7:11 AM

## 2022-12-15 NOTE — Plan of Care (Signed)
  Problem: Consults Goal: RH GENERAL PATIENT EDUCATION Description: See Patient Education module for education specifics. Outcome: Progressing   Problem: RH BOWEL ELIMINATION Goal: RH STG MANAGE BOWEL WITH ASSISTANCE Description: STG Manage Bowel with min Assistance. Outcome: Progressing Goal: RH STG MANAGE BOWEL W/MEDICATION W/ASSISTANCE Description: STG Manage Bowel with Medication with min Assistance. Outcome: Progressing   Problem: RH BLADDER ELIMINATION Goal: RH STG MANAGE BLADDER WITH ASSISTANCE Description: STG Manage Bladder With min Assistance Outcome: Progressing   Problem: RH SKIN INTEGRITY Goal: RH STG SKIN FREE OF INFECTION/BREAKDOWN Description: Skin will remain intact and be free of any additional breakdown/infection min assist  Outcome: Progressing   Problem: RH SAFETY Goal: RH STG ADHERE TO SAFETY PRECAUTIONS W/ASSISTANCE/DEVICE Description: STG Adhere to Safety Precautions With cueing Assistance/Device. Outcome: Progressing   Problem: RH PAIN MANAGEMENT Goal: RH STG PAIN MANAGED AT OR BELOW PT'S PAIN GOAL Description: Pain will be managed at 4 out of 10 on pain scale with PRN medications min assist.  Outcome: Progressing   Problem: RH KNOWLEDGE DEFICIT GENERAL Goal: RH STG INCREASE KNOWLEDGE OF SELF CARE AFTER HOSPITALIZATION Description: Patient/caregiver will be able to manage medications and self care from nursing education and nursing handouts independently  Outcome: Progressing   Problem: RH BOWEL ELIMINATION Goal: RH STG MANAGE BOWEL WITH ASSISTANCE Description: STG Manage Bowel with Assistance. Outcome: Progressing

## 2022-12-15 NOTE — Progress Notes (Signed)
PROGRESS NOTE   Subjective/Complaints:  Pt reports feels like L thigh is swollen- however measured- it's not bigger than R side.  Also c/o mild SOB sometimes- is intermittent.  L BKA fitting better with [employer] making changes to L prosthesis.   Had fierce pain with swelling of L thigh last night -better this AM.   Required cath again this Am for 400cc.   ROS:  Pt denies SOB, abd pain, CP, N/V/C/D, and vision changes  Requiring in/out caths still  Except for HPI  Objective:   No results found. Recent Labs    12/14/22 0435  WBC 11.5*  HGB 9.8*  HCT 30.3*  PLT 255   Recent Labs    12/14/22 0435  NA 136  K 4.9  CL 107  CO2 19*  GLUCOSE 91  BUN 40*  CREATININE 1.49*  CALCIUM 8.9    Intake/Output Summary (Last 24 hours) at 12/15/2022 0902 Last data filed at 12/15/2022 0505 Gross per 24 hour  Intake 600 ml  Output 1437 ml  Net -837 ml        Physical Exam: Vital Signs Blood pressure (!) 143/62, pulse 65, temperature 98.3 F (36.8 C), resp. rate 18, height 5\' 5"  (1.651 m), weight 76.6 kg, SpO2 99 %.           General: awake, alert, appropriate, walking with therapy- great step over step- but slowed, as expected; NAD HENT: conjugate gaze; oropharynx moist CV: regular rate; no JVD Pulmonary: coarse breath sounds- L and R upper lobes and decreased breath sounds B/L bases GI: soft, NT, ND, (+)BS- normoactive Psychiatric: appropriate Neurological: Ox3 - has large deep purple bruise on posterior aspect of L distal thigh. -- is noticeable- , wearing L BKA prosthesis  Musculoskeletal:  Measured B/L mid thigh- L side is 17.5 inches and R side 17.75 inches    Cervical back: Normal range of motion.     Comments: Mild swelling LLE. Left hip with significant tenderness with movement and palpation.   TTP around L hip where has fx. -reports this is gradually improving Skin:    General: Skin is warm and  dry.  Neurological:     Mental Status: He is alert.     Comments: Alert and oriented x 3. Normal insight and awareness. Follows commands, Intact Memory. Normal language and speech. Cranial nerve exam unremarkable. MMT: UE 5/5. RLE 4/5. LLE 3/5 HF, KE d/t pain. Sensory exam normal for light touch and pain in all 4 limbs. No limb ataxia or cerebellar signs. No abnormal tone appreciated.  .     Assessment/Plan: 1. Functional deficits which require 3+ hours per day of interdisciplinary therapy in a comprehensive inpatient rehab setting. Physiatrist is providing close team supervision and 24 hour management of active medical problems listed below. Physiatrist and rehab team continue to assess barriers to discharge/monitor patient progress toward functional and medical goals  Care Tool:  Bathing    Body parts bathed by patient: Right arm, Left arm, Chest, Abdomen, Front perineal area, Right upper leg, Left upper leg   Body parts bathed by helper: Right lower leg, Buttocks Body parts n/a: Left lower leg   Bathing assist Assist  Level: Moderate Assistance - Patient 50 - 74%     Upper Body Dressing/Undressing Upper body dressing   What is the patient wearing?: Pull over shirt    Upper body assist Assist Level: Set up assist    Lower Body Dressing/Undressing Lower body dressing      What is the patient wearing?: Incontinence brief, Pants     Lower body assist Assist for lower body dressing: Moderate Assistance - Patient 50 - 74%     Toileting Toileting    Toileting assist Assist for toileting: Maximal Assistance - Patient 25 - 49%     Transfers Chair/bed transfer  Transfers assist  Chair/bed transfer activity did not occur: Safety/medical concerns  Chair/bed transfer assist level: Contact Guard/Touching assist Chair/bed transfer assistive device: Geologist, engineering   Ambulation assist   Ambulation activity did not occur: Safety/medical concerns  Assist  level: Supervision/Verbal cueing Assistive device: Walker-rolling Max distance: 145ft   Walk 10 feet activity   Assist  Walk 10 feet activity did not occur: Safety/medical concerns  Assist level: Supervision/Verbal cueing Assistive device: Walker-rolling   Walk 50 feet activity   Assist Walk 50 feet with 2 turns activity did not occur: Safety/medical concerns  Assist level: Supervision/Verbal cueing Assistive device: Walker-rolling    Walk 150 feet activity   Assist Walk 150 feet activity did not occur: Safety/medical concerns  Assist level: Supervision/Verbal cueing Assistive device: Walker-rolling    Walk 10 feet on uneven surface  activity   Assist Walk 10 feet on uneven surfaces activity did not occur: Safety/medical concerns         Wheelchair     Assist Is the patient using a wheelchair?: Yes Type of Wheelchair: Manual    Wheelchair assist level: Supervision/Verbal cueing Max wheelchair distance: 150    Wheelchair 50 feet with 2 turns activity    Assist        Assist Level: Supervision/Verbal cueing   Wheelchair 150 feet activity     Assist      Assist Level: Supervision/Verbal cueing   Blood pressure (!) 143/62, pulse 65, temperature 98.3 F (36.8 C), resp. rate 18, height 5\' 5"  (1.651 m), weight 76.6 kg, SpO2 99 %.  Medical Problem List and Plan: 1. Functional deficits secondary to left greater trochanter fracture             -pt with prior left BKA and prosthesis- incorporate donning/doffing, use of prosthetic into therapy plan/goals. Socket a little tighter than usual due to sl swelling in LLE from fracture and not wearing prosthesis as much.             -patient may  shower             -ELOS/Goals: 10 days, mod I to supervision with PT, OT   D/c- 5/10  Con't CIR PT and OT  Walked 153 ft this Am with RW and L BKA  Team conference today to f/u on progress 2.  Antithrombotics: -DVT/anticoagulation:  Pharmaceutical:  Eliquis             -antiplatelet therapy:    3. Pain Management: Tylenol as needed for mild pain             -hydrocodone prn for severe pain             -robaxin prn for spasms             -prn ice   4/27- pain usually controlled per pt- con't regimen-  see how he does in therapies  4/30- Pain this AM- waiting for pain meds-   -5/5 hip pain controlled with hydrocodone, reports gradually improving  5/6- pain doing better, not resolved- meds more helpful  5/7- pain doing much better 4. Mood/Behavior/Sleep: LCSW to evaluate and provide emotional support 5/2- insomnia- called pharmacy- will start Restoril 7.5 mg QHS prn  5/3- slept much better- wants ot con't Restoril             -antipsychotic agents: n/a   -5/4 seen by neuropsych for coping and adjustment 5. Neuropsych/cognition: This patient is capable of making decisions on his own behalf.   6. Skin/Wound Care: Routine skin care checks   7. Fluids/Electrolytes/Nutrition: Routine Is and Os and follow-up chemistries             -continue vitamin B12 supplements, oral irom   8: Hypertension: monitor TID and prn (lasix 40 mg not restarted)             -continue amlodipine 10 mg daily             -continue atenolol 25 mg daily             -continue hydralazine 75 mg BID   4/27- BP somewhat elevated in 160s this Am systolic- usually 140s systolic- will monitor for trend for another 24 hours before changing meds  4/28-4/29 BP doing better- con't regimen  5/3- BP very slightly elevated usually controlled- could be pain? Will monitor trend  -5/5 BP controlled but a little soft at times, dc atenolol to 12.5mg  daily  5/6- BP a little elevated this AM-will monitor for trend  5/7- BP slightly elevated with reduction in Atenolol- will wait to increase, since was bradycardic- now in 60s HR    12/15/2022    4:41 AM 12/14/2022    7:38 PM 12/14/2022    1:27 PM  Vitals with BMI  Systolic 143 137 161  Diastolic 62 49 57  Pulse 65 62 65    9:  Hyperlipidemia: continue statin   10: Atrial fibrillation, chronic: rate controlled; continue Eliquis, BB  -5/5 HR bradycardic at times-appears to be somewhat chronic, will decrease atenolol to 12.5   11: CAD: stable; s/p CABG 2000; balloon angioplasty 2003             -follows with Dr. Tenny Craw   12: OSA:    13: Anemia: continue oral iron; follow-up CBC   14: Left greater trochanter displacement; non-opertive management             -WBAT   5/1- pain really impacting therapy -  5/3- pain doing better per pt/staff  5/7- pain doing much better- walked 153 ft this AM 15: Urinary retention/BPH: continue Proscar and Flomax             -monitor output/PVR's             -OOB to void when possible  4/28- Changed Flomax to q supper- also on Proscar- having bleeding form penis- could be why had blood clots- UTI can explain, but will recheck labs in AM to make sure Hb stable- so far, is 9.9 and stable from prior days. 4/29-  Hb has dropped some 9.3- will recheck in Am and if drops again, will call Urology.   4/30- Hb up to 10.1 and urine now clear 5/2- required a cath last night/this AM - x1- on max dose of Flomax- will add Urecholine 5 mg TID to help-  5/3- per pt, has hx of urinary retention- not emptying- for "months"- asked nursing and wrote order to teach pt in/out caths. Could increase Urecholine this weekend if need be.   5/5 will hold off on increase urecholine today as intermittently bradycardic, blood tinged urine/hematuria with traumatic cath yesterday-appears improving. Try coude cath for IC 5/6- No cath since yesterday AM- voiding better 5/7- cathed last night for 400cc at 1am- still requiring intermittently- maxxed out on meds 16: DM-2: CBGs QID; carb modified diet; A1c 4.8% on 4/08 (diet controlled at home)             -on SSI but not requiring coverage   4/27- CBG's- 98-171- usually below 130- will reduce CBGs to BID-AC since doing well and A1c is 4.8  5/2- stopped SSI/CBGs 17: AKI  atop CKD 3: baseline creatinine ~1.3 ??             -follow-up BMP  4/27- will recheck Sunday AM and con't to check weekly   4/28- Cr up slightly to 1.41 from 1.34- baseline Cr 1.3- pt drinking ~ 3 cups/water /day- asked him to drink 6-8 cups/day and will recheck in AM  4/29- Cr stable at 1.4- will con't topush fluids by [facility] Thursday  4/30- will recheck in AM since BUN/Cr slightly rising.   5/1- Cr down to 1.33 and BUN up to 35- likely dry- since was scared of being cathed.   5/6- Cr up slightly to 1.49- think pt is drinking less to void less- will d/w pt  18: PAD: s/p left BKA 2019 Dr. Wyn Quaker             -continue Trental   19: Leukocytosis: afebrile; on Zosyn for UTI, started today 4/26             -follow-up sensitives -follow-up CBC 4/27- will recheck in AM/Sunday - is afebrile 4/28- WBC down to 10.5 from 12.4 4/29- WBC down to 9.7k 4/30- WBC back up to 12.9- likely because didn't get Cipro treatment as recommended- will recheck in AM 5/6- WBC up slightly to 11.5- will recheck Wednesday 5/7- Checking CXR since sounds coarse- mildly SOB- will monitor closely.  21: Pseudomonas (and E Coli) UTI             -continue Zosyn iv x 3 days at least then po cipro x 3 more days per [facility]  4/29- changing to Cipro today- having blood clots and sediment in urine- likely due to UTI  4/30- Cipro didn't start yesterday when Zosyn stopped for some reason- will start- since WBC up to 12.9- -will recheck labs in AM  5/1- WBC down to 10.7- so appears like right medicine  22. Urinary retention  4/28- started Flomax- needed in/out cath due to retention- likely due to UTI and constipation  4/29- peed this Am; required cath last night-overnight- still has blood clots- per documentation. But voiding better  4/30- no cath since yesterday am and pt reports not bloody when pees.   5/3- still requiring caths- will start teaching- and let SW know will likely go home cathing.   5/4 continues to require IC,  continue current plan 23. Constipation  4/28- pt has stool ball, it sounds like- trying to break up- also ordered Sorbitol 45cc this AM- and asked nursing to do digital disimpaction if need be.   4/29- LBM yesterday x2- feels better- will increase bowel meds- add Senokot-S 1 tab BID  4/30- LBM yesterday  5/2- LBM 4/30- nursing ot give  Sorbitol today after therapy-will add Miralax to meds  5/3- LBM yesterday x2 with sorbitol-   5/4 LBM today improved  5/6- LBM x3 yesterday 24. L BKA- old  5/1- had some redness 4/30 so foam placed- looks better today- spoke with PT- will con't to keep an eye on it- pain is doing better  5/2- skin looks great this AM- no pain on "nub".   5/7- doing MUCH better since [employer] made "cutouts" on prosthesis. Fitting much better and not rubbing  25. Sinus drainage/mild SOB  5/6- will add Flonase daily.   5/7- sounds very coarse- will get CXR and go over- might need Lasix? 26. L thigh swelling  5/7- pt insists L thigh is swollen, however per measurements- L thigh is 17.5 inches and R thigh in same locations is 17.75 inches- does have large purple bruising- Hb stable over last 1 week.   I spent a total of 52   minutes on total care today- >50% coordination of care- due to  Monitoring pt walking; measuring pt's thighs due to swelling; and CXR- reading independently - also team conference to f/u on progress    LOS: 11 days A FACE TO FACE EVALUATION WAS PERFORMED  Massimo Hartland 12/15/2022, 9:02 AM

## 2022-12-15 NOTE — Progress Notes (Signed)
Physical Therapy Session Note  Patient Details  Name: Eric [name]. MRN: [id number] Date of Birth: 03-22-1928  Today's Date: 12/15/2022 PT Individual Time: 0800-0900 PT Individual Time Calculation (min): 60 min   Short Term Goals: Week 1:  PT Short Term Goal 1 (Week 1): Pt will perform supine<>sit with no more than CGA PT Short Term Goal 1 - Progress (Week 1): Progressing toward goal PT Short Term Goal 2 (Week 1): Pt will perform sit<>stands using LRAD with no more than CGA PT Short Term Goal 2 - Progress (Week 1): Met PT Short Term Goal 3 (Week 1): Pt will perform bed<>chair transfers using LRAD with CGA PT Short Term Goal 3 - Progress (Week 1): Met PT Short Term Goal 4 (Week 1): Pt will ambulate at least 16ft using LRAD with CGA PT Short Term Goal 4 - Progress (Week 1): Met  Skilled Therapeutic Interventions/Progress Updates:      Therapy Documentation Precautions:  Precautions Precautions: Fall, Other (comment) Precaution Comments: h/o L BKA (has prosthetic), L LE WBAT, no active hip abduction Restrictions Weight Bearing Restrictions: Yes LLE Weight Bearing: Weight bearing as tolerated Other Position/Activity Restrictions: no active hip abduction  Pt received in therapy gym with previous PT and current therapist present for handoff. Pt reports 2/10 left hip pain and provided rest for relief. MD present for AM rounds.   Pt propelled w/c ~200 ft to ortho gym with supervision for safety. Pt utilized UE arm bike at level 1 x 10 minutes to improve UE strength and activity tolerance.   PT provided pt with online amputee suppport group and pt requested time to consider attending.   Pt performed following chest press 3 x 10 with tidal tank to improve UE activity tolerance and strength.   Pt propelled w/c to room and left seated at bedside with all needs in reach and alarm on.    Therapy/Group: Individual Therapy  [name] [name] PT, DPT  12/15/2022, 7:39 AM

## 2022-12-15 NOTE — Patient Care Conference (Signed)
Inpatient RehabilitationTeam Conference and Plan of Care Update Date: 12/15/2022   Time: 11:27 AM    Patient Name: Eric [name].      Medical Record Number: [id number]  Date of Birth: [birth year] Sex: Male         Room/Bed: 4W01C/4W01C-01 Payor Info: Payor: [employer] / Plan: [employer] PPO / Product Type: *No Product type* /    Admit Date/Time:  12/04/2022  5:19 PM  Primary Diagnosis:  Greater trochanter fracture [facility])  Hospital Problems: Principal Problem:   Greater trochanter fracture (HCC) Active Problems:   Traumatic closed trochanteric fracture of femur with minimal displacement with routine healing, left   Coping style affecting medical condition    Expected Discharge Date: Expected Discharge Date: 12/18/22  Team Members Present: Physician leading conference: Dr. Genice Rouge Social Worker Present: Dossie Der, LCSW Nurse Present: Vedia Pereyra, RN PT Present: [name], PT OT Present: Roney Mans, OT PPS Coordinator present : Edson Snowball, PT     Current Status/Progress Goal Weekly Team Focus  Bowel/Bladder   Continent of B&B, LBM 5/6. Pvr q6h , i /o >   Regain continence of bladder   Assess toileting q shift and prn    Swallow/Nutrition/ Hydration               ADL's   Mod I UB self care seated, close S UB standing, min A LB self care, CGA toilet and shower access   Supervision   progress standing and amb ADL's and mobility, Family Educ    Mobility               Communication                Safety/Cognition/ Behavioral Observations               Pain   c/o left hip pain, prn Norco q 8hr prn   pain < or = 5   Assess pain q shift and as needed    Skin   Bruising to left thigh, bka left lower extremity   Maintain good skin integrity  Assess skin q shift and as needed      Discharge Planning:  All four children will be coming in this week to learn pt's care in prepartion for DC home Friday.  Wife can be there but not assist has own health issues. Pt learning how to I & O cath will need 1-2x per day according to MD. [facility]   Team Discussion: Greater trochanter fracture. I/O cath education in process. Video at bedside. Hip pain managed with PRN medications. Sleeping better. SOB-chest xray pending results. Family education with sons went well.  Rest of children will be in later for family training.  Patient on target to meet rehab goals: yes, on track to meet goals  *See Care Plan and progress notes for long and short-term goals.   Revisions to Treatment Plan:  [employer] to follow up outpatient regarding prothesis.   Teaching Needs: Medications, safety, self care, gait/transfer training, skin care, etc.  Current Barriers to Discharge: Decreased caregiver support and Neurogenic bowel and bladder  Possible Resolutions to Barriers: Family education, independent with self cath, order recommenced DME     Medical Summary Current Status: needs to have night shift to teach caths- requiring caths usually at night- L BKA prosthesis fitting better since [employer] got it fixed-somewhat for him  Barriers to Discharge: Medical stability;Self-care education;Neurogenic Bowel & Bladder;Uncontrolled Pain;Weight bearing restrictions  Barriers to Discharge Comments: family training this week; most done- pt wants ot cath himself- Possible Resolutions to [employer] Focus: teaching cathing- CXR pending for mild SOB- waiting for results- d/c 5/10- Friday   Continued Need for Acute Rehabilitation Level of Care: The patient requires daily medical management by a physician with specialized training in physical medicine and rehabilitation for the following reasons: Direction of a multidisciplinary physical rehabilitation program to maximize functional independence : Yes Medical management of patient stability for increased activity during participation in an intensive rehabilitation regime.:  Yes Analysis of laboratory values and/or radiology reports with any subsequent need for medication adjustment and/or medical intervention. : Yes   I attest that I was present, lead the team conference, and concur with the assessment and plan of the team.   Jearld Adjutant 12/15/2022, 3:58 PM

## 2022-12-15 NOTE — Progress Notes (Signed)
Occupational Therapy Session Note  Patient Details  Name: Eric [name]. MRN: [id number] Date of Birth: 15-Dec-1927  Today's Date: 12/15/2022 OT Individual Time: 7829-5621 OT Individual Time Calculation (min): 55 min    Short Term Goals: Week 1:  OT Short Term Goal 1 (Week 1): Pt will be able to ambulate to bathroom with RW with CGA. OT Short Term Goal 1 - Progress (Week 1): Progressing toward goal OT Short Term Goal 2 (Week 1): Pt will complete stand pivot transfer wc to toilet with Supervision with RW. OT Short Term Goal 2 - Progress (Week 1): Progressing toward goal OT Short Term Goal 3 (Week 1): Pt will be able to don pants over feet with CGA. OT Short Term Goal 3 - Progress (Week 1): Progressing toward goal OT Short Term Goal 4 (Week 1): Pt will be able to pull pants over hips with CGA. OT Short Term Goal 4 - Progress (Week 1): Progressing toward goal  Skilled Therapeutic Interventions/Progress Updates:     Pt received in w/c with no pain.Pt requesting BADL at sink level but agreeable to trial dry run of TTB transfer in room shower in prep for shower level BADL. OT lowers shower seat height to improve abiltiy to reach floor. ADL: Pt completes ADL at overall min-CGA Level. Skilled interventions include: issue of reacher to use for LB dressing EOB with instructional cues for threading RLE first, sit to stand from low bed with MIN A and set up for sink side UB ADLs. Pt shaves with set up seated at sink and copleted oral care with set up. TTB transfer with CGA using grab bar. Instructedd use of towel on seat at home since pt states it is slick  Pt left at end of session in w/c with exit alarm on, call light in reach and all needs met   Therapy Documentation Precautions:  Precautions Precautions: Fall, Other (comment) Precaution Comments: h/o L BKA (has prosthetic), L LE WBAT, no active hip abduction Restrictions Weight Bearing Restrictions: Yes LLE Weight Bearing: Weight  bearing as tolerated Other Position/Activity Restrictions: no active hip abduction Therapy/Group: Individual Therapy  Shon Hale 12/15/2022, 6:49 AM

## 2022-12-15 NOTE — Progress Notes (Signed)
Occupational Therapy Session Note  Patient Details  Name: Eric [name]. MRN: [id number] Date of Birth: 10-Mar-1928  Today's Date: 12/15/2022 OT Individual Time: 1100-1200 OT Individual Time Calculation (min): 60 min    Short Term Goals: Week 2:  OT Short Term Goal 1 (Week 2): STGs=LTGs due to patient's length of stay.  Skilled Therapeutic Interventions/Progress Updates:    Pt greeted seated in wc and agreeable to OT treatment session. Pt propelled wc to therapy gym and ambulated in the gym 10 feet w/ RW to therapy mat. Simulated clothing management task using clothes pins placed around waist, then placing clothes pins on basketball goals net. Pt needed CGA for balance in standing. UB there-ex using 3 lb dowel rod, 3 setss of 10 chest press, bicep curl, straight arm raise. Pt ambulated 5 feet in gym, but then his L knee began to buckle and he had to return to sitting. Pt stated his legs were tired from ambulating earlier in the morning. Pt returned to room and ambulated 5 feet to recliner with CGA and RW. Pt left seated in recliner with alarm belt on, call bell in reach, and needs met.   Therapy Documentation Precautions:  Precautions Precautions: Fall, Other (comment) Precaution Comments: h/o L BKA (has prosthetic), L LE WBAT, no active hip abduction Restrictions Weight Bearing Restrictions: Yes LLE Weight Bearing: Weight bearing as tolerated Other Position/Activity Restrictions: no active hip abduction Pain: Denies pain  Therapy/Group: Individual Therapy  [name] 12/15/2022, 11:23 AM

## 2022-12-15 NOTE — Progress Notes (Signed)
Patient ID: Eric [name]., male   DOB: 1927-12-26, 89 y.o.   MRN: [id number]  Met with pt to give him team conference update and progress toward his goals for discharge Friday 5/10. He feels he is doing well and will be ready for Friday. Have made Home health and DME arrangements. He has decided to get his own bed and to cancel the hospital bed ordered via [employer]. His daughter's are coming in Thursday for hands on education son's were here yesterday and it went well.

## 2022-12-16 LAB — CBC WITH DIFFERENTIAL/PLATELET
Abs Immature Granulocytes: 0.04 10*3/uL (ref 0.00–0.07)
Basophils Absolute: 0.1 10*3/uL (ref 0.0–0.1)
Basophils Relative: 1 %
Eosinophils Absolute: 0.2 10*3/uL (ref 0.0–0.5)
Eosinophils Relative: 2 %
HCT: 30.1 % — ABNORMAL LOW (ref 39.0–52.0)
Hemoglobin: 9.7 g/dL — ABNORMAL LOW (ref 13.0–17.0)
Immature Granulocytes: 0 %
Lymphocytes Relative: 29 %
Lymphs Abs: 2.7 10*3/uL (ref 0.7–4.0)
MCH: 29.4 pg (ref 26.0–34.0)
MCHC: 32.2 g/dL (ref 30.0–36.0)
MCV: 91.2 fL (ref 80.0–100.0)
Monocytes Absolute: 0.6 10*3/uL (ref 0.1–1.0)
Monocytes Relative: 7 %
Neutro Abs: 5.6 10*3/uL (ref 1.7–7.7)
Neutrophils Relative %: 61 %
Platelets: 254 10*3/uL (ref 150–400)
RBC: 3.3 MIL/uL — ABNORMAL LOW (ref 4.22–5.81)
RDW: 16.6 % — ABNORMAL HIGH (ref 11.5–15.5)
WBC: 9.2 10*3/uL (ref 4.0–10.5)
nRBC: 0 % (ref 0.0–0.2)

## 2022-12-16 LAB — BASIC METABOLIC PANEL
Anion gap: 6 (ref 5–15)
BUN: 37 mg/dL — ABNORMAL HIGH (ref 8–23)
CO2: 21 mmol/L — ABNORMAL LOW (ref 22–32)
Calcium: 8.8 mg/dL — ABNORMAL LOW (ref 8.9–10.3)
Chloride: 108 mmol/L (ref 98–111)
Creatinine, Ser: 1.18 mg/dL (ref 0.61–1.24)
GFR, Estimated: 58 mL/min — ABNORMAL LOW (ref >60)
Glucose, Bld: 90 mg/dL (ref 70–99)
Potassium: 4.5 mmol/L (ref 3.5–5.1)
Sodium: 135 mmol/L (ref 135–145)

## 2022-12-16 NOTE — Patient Instructions (Signed)

## 2022-12-16 NOTE — Progress Notes (Signed)
Physical Therapy Session Note  Patient Details  Name: Eric [name]. MRN: [id number] Date of Birth: [birth year]  Today's Date: 12/16/2022 PT Individual Time: 1025-1132, 1410-1535  PT Individual Time Calculation (min): 67 min  , 85 min  Today's Date: 12/16/2022 PT Missed Time: 8 Minutes Missed Time Reason: Other (Comment) (time management, missed minutes made up with 2nd session)  Short Term Goals: Week 2:  PT Short Term Goal 1 (Week 2): STG=LTG due to ELOS  Skilled Therapeutic Interventions/Progress Updates:      Therapy Documentation Precautions:  Precautions Precautions: Fall, Other (comment) Precaution Comments: h/o L BKA (has prosthetic), L LE WBAT, no active hip abduction Restrictions Weight Bearing Restrictions: Yes LLE Weight Bearing: Weight bearing as tolerated Other Position/Activity Restrictions: no active hip abduction  Treatment Session 1:   Pt received seated in w/c at bedside and with unrated hip pain, pre-medicated and provided rest for relief. Pt agreeable to PT session with emphasis on UE strength training and dynamic standing balance training.   Pt propelled w/c ~150 ft to dayroom and performed fine motor tasks with R UE (Wii remote) while engaging in Wii bowling and table tennis from seated position.   Pt participated in bowling and alternated hands for tossing bowling ball in sitting and standing position with (S) for safety. Pt able to tolerate standing position ~5 minutes.   Pt requires supervision for sit to stand and dynamic standing balance while reaching with L UE in multiple directions for bean bag retrieval and tossing to corn hole target.   Pt propelled w/c to room and left seated in w/c at bedside with all needs in reach and alarm on.    Treatment Session 2:   Pt received seated in w/c at bedside and reports 8/10 left hip pain that decreased to 6/10 with rest. Pt also received pain medications prior to session.   Pt propelled w/c ~300 ft to  ortho gym and requires distant supervision for mobility and w/c part management.   Pt engaged in blocked practice of shadow boxing 5 x 1 minute with contralateral reaching to address UE strength and activity tolerance deficits.   Pt (S) with ambulatory transfer from w/c to mat and requires distant supervision for dynamic sitting balance as pt performed following exercises on dynamic disc:   -chest press 3 x 10 with tidal tank   -bicep curls 3 x 10 with 4# weighted bar   -scapular retractions 2 x 10 with 4# weighted bar  Pt propelled w/c (S) and ambulated remaining ~132 feet to room with close (S) for safety with RW.   Pt left seated in recliner at bedside with all needs in reach and alarm on.   Therapy/Group: Individual Therapy  [name] [name] PT, DPT  12/16/2022, 7:53 AM

## 2022-12-16 NOTE — Progress Notes (Signed)
Occupational Therapy Session Note  Patient Details  Name: Eric [name]. MRN: [id number] Date of Birth: 1928/08/07  Today's Date: 12/16/2022 OT Individual Time: 4098-1191 OT Individual Time Calculation (min): 40 min    Short Term Goals: Week 2:  OT Short Term Goal 1 (Week 2): STGs=LTGs due to patient's length of stay.  Skilled Therapeutic Interventions/Progress Updates:    Patient agreeable to participate in OT session. Reports 1/10 pain level in right hip. States that last night was his best night he's had so far. Reports that his pain level has been much better than it originally was.    Pt completed functional mobility utilizing RW and Min guard assist (w/c following) from pt's room to half way to Day room.  Patient participated in skilled OT session focusing on functional mobility, UB strengthening. Therapist integrated UB strengthening while performing lateral scoots seated on mat table from left to right side. Then completed same lateral scoot to the right ending at half way mark in order to improve ability to complete functional transfers and sit to stands with increased independence and less difficulty.  UB strengthening: - seated, shoulder, BUE, horizontal abduction/adduction, IR/er, PNF 1&2, adduction,flexion; triceps extension, 12X, 1 set. VC provided with visual demonstration for proper form and technique.  Pt completed w/c management while self propelling himself from day room back to room focusing on UB strengthening. No rest breaks needed.   Therapy Documentation Precautions:  Precautions Precautions: Fall, Other (comment) Precaution Comments: h/o L BKA (has prosthetic), L LE WBAT, no active hip abduction Restrictions Weight Bearing Restrictions: Yes LLE Weight Bearing: Weight bearing as tolerated Other Position/Activity Restrictions: no active hip abduction   Therapy/Group: Individual Therapy  Limmie Patricia, OTR/L,CBIS  Supplemental OT - [facility] and [facility]  Chat Preferred   12/16/2022, 7:54 AM

## 2022-12-16 NOTE — Progress Notes (Signed)
PROGRESS NOTE   Subjective/Complaints:  Pt reports didn't sleep last night very well- but doesn't feel sleepy this AM.  Had the best night, pain wise- didn't go to sleep til 1 am-  Didn't require cath last night.    ROS:   Pt denies SOB, abd pain, CP, N/V/C/D, and vision changes   Except for HPI  Objective:   DG CHEST PORT 1 VIEW  Result Date: 12/15/2022 CLINICAL DATA:  Shortness breath EXAM: PORTABLE CHEST 1 VIEW COMPARISON:  CXR 09/05/21 FINDINGS: Status post median sternotomy and CABG. Cardiomegaly pleural effusion. No pneumothorax redemonstrated flattening of the bilateral hemidiaphragms. No focal airspace opacity. No radiographically apparent displaced rib fractures. Visualized upper abdomen is unremarkable. IMPRESSION: 1.  No focal airspace opacity. 2.  Cardiomegaly. Electronically Signed   By: Lorenza Cambridge M.D.   On: 12/15/2022 12:42   Recent Labs    12/14/22 0435 12/16/22 0502  WBC 11.5* 9.2  HGB 9.8* 9.7*  HCT 30.3* 30.1*  PLT 255 254   Recent Labs    12/14/22 0435 12/16/22 0502  NA 136 135  K 4.9 4.5  CL 107 108  CO2 19* 21*  GLUCOSE 91 90  BUN 40* 37*  CREATININE 1.49* 1.18  CALCIUM 8.9 8.8*    Intake/Output Summary (Last 24 hours) at 12/16/2022 0809 Last data filed at 12/16/2022 0700 Gross per 24 hour  Intake 1633 ml  Output 1400 ml  Net 233 ml        Physical Exam: Vital Signs Blood pressure (!) 140/62, pulse 68, temperature 98.6 F (37 C), temperature source Oral, resp. rate 16, height 5\' 5"  (1.651 m), weight 76.6 kg, SpO2 97 %.            General: awake, alert, appropriate, sitting up in bed; asking to ho to bathroom; NAD HENT: conjugate gaze; oropharynx moist CV: regular rate; no JVD Pulmonary: CTA B/L; no W/R/R- good air movement GI: soft, NT, ND, (+)BS- normoactive Psychiatric: appropriate- brighter this AM Neurological: Ox3  - still has large deep purple bruise on  posterior aspect of L distal thigh. -- is noticeable- , not wearing L BKA prosthesis yet Musculoskeletal:  Measured B/L mid thigh- L side is 17.5 inches and R side 17.75 inches    Cervical back: Normal range of motion.     Comments: Mild swelling LLE. Left hip with significant tenderness with movement and palpation.   TTP around L hip where has fx. -reports this is gradually improving Skin:    General: Skin is warm and dry.  Neurological:     Mental Status: He is alert.     Comments: Alert and oriented x 3. Normal insight and awareness. Follows commands, Intact Memory. Normal language and speech. Cranial nerve exam unremarkable. MMT: UE 5/5. RLE 4/5. LLE 3/5 HF, KE d/t pain. Sensory exam normal for light touch and pain in all 4 limbs. No limb ataxia or cerebellar signs. No abnormal tone appreciated.  .     Assessment/Plan: 1. Functional deficits which require 3+ hours per day of interdisciplinary therapy in a comprehensive inpatient rehab setting. Physiatrist is providing close team supervision and 24 hour management of active medical problems listed  below. Physiatrist and rehab team continue to assess barriers to discharge/monitor patient progress toward functional and medical goals  Care Tool:  Bathing    Body parts bathed by patient: Right arm, Left arm, Chest, Abdomen, Front perineal area, Right upper leg, Left upper leg   Body parts bathed by helper: Right lower leg, Buttocks Body parts n/a: Left lower leg   Bathing assist Assist Level: Moderate Assistance - Patient 50 - 74%     Upper Body Dressing/Undressing Upper body dressing   What is the patient wearing?: Pull over shirt    Upper body assist Assist Level: Set up assist    Lower Body Dressing/Undressing Lower body dressing      What is the patient wearing?: Incontinence brief, Pants     Lower body assist Assist for lower body dressing: Moderate Assistance - Patient 50 - 74%     Toileting Toileting    Toileting  assist Assist for toileting: Maximal Assistance - Patient 25 - 49%     Transfers Chair/bed transfer  Transfers assist  Chair/bed transfer activity did not occur: Safety/medical concerns  Chair/bed transfer assist level: Contact Guard/Touching assist Chair/bed transfer assistive device: Geologist, engineering   Ambulation assist   Ambulation activity did not occur: Safety/medical concerns  Assist level: Supervision/Verbal cueing Assistive device: Walker-rolling Max distance: 128ft   Walk 10 feet activity   Assist  Walk 10 feet activity did not occur: Safety/medical concerns  Assist level: Supervision/Verbal cueing Assistive device: Walker-rolling   Walk 50 feet activity   Assist Walk 50 feet with 2 turns activity did not occur: Safety/medical concerns  Assist level: Supervision/Verbal cueing Assistive device: Walker-rolling    Walk 150 feet activity   Assist Walk 150 feet activity did not occur: Safety/medical concerns  Assist level: Supervision/Verbal cueing Assistive device: Walker-rolling    Walk 10 feet on uneven surface  activity   Assist Walk 10 feet on uneven surfaces activity did not occur: Safety/medical concerns         Wheelchair     Assist Is the patient using a wheelchair?: Yes Type of Wheelchair: Manual    Wheelchair assist level: Supervision/Verbal cueing Max wheelchair distance: 150    Wheelchair 50 feet with 2 turns activity    Assist        Assist Level: Supervision/Verbal cueing   Wheelchair 150 feet activity     Assist      Assist Level: Supervision/Verbal cueing   Blood pressure (!) 140/62, pulse 68, temperature 98.6 F (37 C), temperature source Oral, resp. rate 16, height 5\' 5"  (1.651 m), weight 76.6 kg, SpO2 97 %.  Medical Problem List and Plan: 1. Functional deficits secondary to left greater trochanter fracture             -pt with prior left BKA and prosthesis- incorporate  donning/doffing, use of prosthetic into therapy plan/goals. Socket a little tighter than usual due to sl swelling in LLE from fracture and not wearing prosthesis as much.             -patient may  shower             -ELOS/Goals: 10 days, mod I to supervision with PT, OT   D/c- 5/10  Co't CIR PT and OT 2.  Antithrombotics: -DVT/anticoagulation:  Pharmaceutical: Eliquis             -antiplatelet therapy:    3. Pain Management: Tylenol as needed for mild pain             -  hydrocodone prn for severe pain             -robaxin prn for spasms             -prn ice   4/27- pain usually controlled per pt- con't regimen- see how he does in therapies  4/30- Pain this AM- waiting for pain meds-   -5/5 hip pain controlled with hydrocodone, reports gradually improving  5/6- pain doing better, not resolved- meds more helpful  5/8- pain doing better- best night of all 4. Mood/Behavior/Sleep: LCSW to evaluate and provide emotional support 5/2- insomnia- called pharmacy- will start Restoril 7.5 mg QHS prn  5/3- slept much better- wants ot con't Restoril             -antipsychotic agents: n/a   -5/4 seen by neuropsych for coping and adjustment 5. Neuropsych/cognition: This patient is capable of making decisions on his own behalf.   6. Skin/Wound Care: Routine skin care checks   7. Fluids/Electrolytes/Nutrition: Routine Is and Os and follow-up chemistries             -continue vitamin B12 supplements, oral irom   8: Hypertension: monitor TID and prn (lasix 40 mg not restarted)             -continue amlodipine 10 mg daily             -continue atenolol 25 mg daily             -continue hydralazine 75 mg BID   4/27- BP somewhat elevated in 160s this Am systolic- usually 140s systolic- will monitor for trend for another 24 hours before changing meds  4/28-4/29 BP doing better- con't regimen  5/3- BP very slightly elevated usually controlled- could be pain? Will monitor trend  -5/5 BP controlled but a  little soft at times, dc atenolol to 12.5mg  daily  5/6- BP a little elevated this AM-will monitor for trend  5/7- BP slightly elevated with reduction in Atenolol- will wait to increase, since was bradycardic- now in 60s HR    12/15/2022    8:16 PM 12/15/2022   12:55 PM 12/15/2022    4:41 AM  Vitals with BMI  Systolic 140 146 161  Diastolic 62 60 62  Pulse 68 57 65    9: Hyperlipidemia: continue statin   10: Atrial fibrillation, chronic: rate controlled; continue Eliquis, BB  -5/5 HR bradycardic at times-appears to be somewhat chronic, will decrease atenolol to 12.5   11: CAD: stable; s/p CABG 2000; balloon angioplasty 2003             -follows with Dr. Tenny Craw   12: OSA:    13: Anemia: continue oral iron; follow-up CBC   14: Left greater trochanter displacement; non-opertive management             -WBAT   5/1- pain really impacting therapy -  5/3- pain doing better per pt/staff  5/7- pain doing much better- walked 153 ft this AM 15: Urinary retention/BPH: continue Proscar and Flomax             -monitor output/PVR's             -OOB to void when possible  4/28- Changed Flomax to q supper- also on Proscar- having bleeding form penis- could be why had blood clots- UTI can explain, but will recheck labs in AM to make sure Hb stable- so far, is 9.9 and stable from prior days. 4/29-  Hb has dropped some  9.3- will recheck in Am and if drops again, will call Urology.   4/30- Hb up to 10.1 and urine now clear 5/2- required a cath last night/this AM - x1- on max dose of Flomax- will add Urecholine 5 mg TID to help-   5/3- per pt, has hx of urinary retention- not emptying- for "months"- asked nursing and wrote order to teach pt in/out caths. Could increase Urecholine this weekend if need be.   5/5 will hold off on increase urecholine today as intermittently bradycardic, blood tinged urine/hematuria with traumatic cath yesterday-appears improving. Try coude cath for IC 5/6- No cath since yesterday  AM- voiding better 5/7- cathed last night for 400cc at 1am- still requiring intermittently- maxxed out on meds 5/8- wasn't cathed last night- is trying to double void and says been doing this for awhile, even before admission 16: DM-2: CBGs QID; carb modified diet; A1c 4.8% on 4/08 (diet controlled at home)             -on SSI but not requiring coverage   4/27- CBG's- 98-171- usually below 130- will reduce CBGs to BID-AC since doing well and A1c is 4.8  5/2- stopped SSI/CBGs 17: AKI atop CKD 3: baseline creatinine ~1.3 ??             -follow-up BMP  4/27- will recheck Sunday AM and con't to check weekly   4/28- Cr up slightly to 1.41 from 1.34- baseline Cr 1.3- pt drinking ~ 3 cups/water /day- asked him to drink 6-8 cups/day and will recheck in AM  4/29- Cr stable at 1.4- will con't topush fluids by [facility] Thursday  4/30- will recheck in AM since BUN/Cr slightly rising.   5/1- Cr down to 1.33 and BUN up to 35- likely dry- since was scared of being cathed.   5/6- Cr up slightly to 1.49- think pt is drinking less to void less- will d/w pt   5/8- Cr down to 1.18 and BUN 40 (37)- will d/w pt to drink more 18: PAD: s/p left BKA 2019 Dr. Wyn Quaker             -continue Trental   19: Leukocytosis: afebrile; on Zosyn for UTI, started today 4/26             -follow-up sensitives -follow-up CBC 4/27- will recheck in AM/Sunday - is afebrile 4/28- WBC down to 10.5 from 12.4 4/29- WBC down to 9.7k 4/30- WBC back up to 12.9- likely because didn't get Cipro treatment as recommended- will recheck in AM 5/6- WBC up slightly to 11.5- will recheck Wednesday 5/7- Checking CXR since sounds coarse- mildly SOB- will monitor closely.  21: Pseudomonas (and E Coli) UTI             -continue Zosyn iv x 3 days at least then po cipro x 3 more days per [facility]  4/29- changing to Cipro today- having blood clots and sediment in urine- likely due to UTI  4/30- Cipro didn't start yesterday when Zosyn stopped for some  reason- will start- since WBC up to 12.9- -will recheck labs in AM  5/1- WBC down to 10.7- so appears like right medicine 5/8- WBC 9.2- doing better  22. Urinary retention  4/28- started Flomax- needed in/out cath due to retention- likely due to UTI and constipation  4/29- peed this Am; required cath last night-overnight- still has blood clots- per documentation. But voiding better  4/30- no cath since yesterday am and pt reports not bloody when  pees.   5/3- still requiring caths- will start teaching- and let SW know will likely go home cathing.   5/8- still requiring caths intermittently 23. Constipation  4/28- pt has stool ball, it sounds like- trying to break up- also ordered Sorbitol 45cc this AM- and asked nursing to do digital disimpaction if need be.   4/29- LBM yesterday x2- feels better- will increase bowel meds- add Senokot-S 1 tab BID  4/30- LBM yesterday  5/2- LBM 4/30- nursing ot give Sorbitol today after therapy-will add Miralax to meds  5/3- LBM yesterday x2 with sorbitol-   5/4 LBM today improved  5/6- LBM x3 yesterday 24. L BKA- old  5/1- had some redness 4/30 so foam placed- looks better today- spoke with PT- will con't to keep an eye on it- pain is doing better  5/2- skin looks great this AM- no pain on "nub".   5/7- doing MUCH better since [employer] made "cutouts" on prosthesis. Fitting much better and not rubbing  25. Sinus drainage/mild SOB  5/6- will add Flonase daily.   5/7- sounds very coarse- will get CXR and go over- might need Lasix? 26. L thigh swelling  5/7- pt insists L thigh is swollen, however per measurements- L thigh is 17.5 inches and R thigh in same locations is 17.75 inches- does have large purple bruising- Hb stable over last 1 week.  26. Insomnia  5/8- Didn't sleep last night- doesn't want to change sleeping meds.   I spent a total of 35   minutes on total care today- >50% coordination of care- due to  Prolonged d/w pt-about cahting and double  voiding   LOS: 12 days A FACE TO FACE EVALUATION WAS PERFORMED  Chrislyn Seedorf 12/16/2022, 8:09 AM

## 2022-12-16 NOTE — Progress Notes (Deleted)
INPATIENT REHABILITATION ADMISSION NOTE   Arrival Method:wheelchair     Mental Orientation: alert  Assessment:done   Skin:done   IV'S:   Pain: when moving   Tubes and Drains: none  Safety Measures: done  Vital Signs:done   Height and Weight: done  Rehab Orientation:done   Family:wife and grand daughter    Notes:

## 2022-12-17 ENCOUNTER — Other Ambulatory Visit (HOSPITAL_COMMUNITY): Payer: Self-pay

## 2022-12-17 MED ORDER — TAMSULOSIN HCL 0.4 MG PO CAPS
0.8000 mg | ORAL_CAPSULE | Freq: Every day | ORAL | 0 refills | Status: DC
  Filled 2022-12-17: qty 60, 30d supply, fill #0

## 2022-12-17 MED ORDER — HYDROCODONE-ACETAMINOPHEN 5-325 MG PO TABS
1.0000 | ORAL_TABLET | Freq: Four times a day (QID) | ORAL | 0 refills | Status: AC | PRN
  Filled 2022-12-17: qty 20, 5d supply, fill #0

## 2022-12-17 MED ORDER — ATENOLOL 25 MG PO TABS
12.5000 mg | ORAL_TABLET | Freq: Every day | ORAL | 0 refills | Status: DC
  Filled 2022-12-17: qty 15, 30d supply, fill #0

## 2022-12-17 MED ORDER — FLUTICASONE PROPIONATE 50 MCG/ACT NA SUSP: 1.0000 | Freq: Every day | NASAL | 2 refills | Status: DC

## 2022-12-17 MED ORDER — BETHANECHOL CHLORIDE 10 MG PO TABS
5.0000 mg | ORAL_TABLET | Freq: Three times a day (TID) | ORAL | 0 refills | Status: DC
  Filled 2022-12-17: qty 45, 30d supply, fill #0

## 2022-12-17 MED ORDER — TEMAZEPAM 7.5 MG PO CAPS
7.5000 mg | ORAL_CAPSULE | Freq: Every evening | ORAL | 0 refills | Status: DC | PRN
  Filled 2022-12-17 (×2): qty 15, 15d supply, fill #0

## 2022-12-17 NOTE — Progress Notes (Addendum)
Nursing education done with daughters. RN went over patient's book. Clarified re: education self cath. No further questions noted.

## 2022-12-17 NOTE — Progress Notes (Signed)
PROGRESS NOTE   Subjective/Complaints:  Pt able to void again and so avoided cath last night- Pt and son have been trained to do cath sif need be at home.   LBM yesterday- was tiny, but wants to go again.   Ate 100% tray.  Ready for d/c tomorrow.    ROS:   Pt denies SOB, abd pain, CP, N/V/C/D, and vision changes   Except for HPI  Objective:   No results found. Recent Labs    12/16/22 0502  WBC 9.2  HGB 9.7*  HCT 30.1*  PLT 254   Recent Labs    12/16/22 0502  NA 135  K 4.5  CL 108  CO2 21*  GLUCOSE 90  BUN 37*  CREATININE 1.18  CALCIUM 8.8*    Intake/Output Summary (Last 24 hours) at 12/17/2022 0949 Last data filed at 12/17/2022 0981 Gross per 24 hour  Intake 915 ml  Output 700 ml  Net 215 ml        Physical Exam: Vital Signs Blood pressure (!) 147/55, pulse (!) 58, temperature 98.1 F (36.7 C), resp. rate 18, height 5\' 5"  (1.651 m), weight 76.6 kg, SpO2 99 %.             General: awake, alert, appropriate, sitting up in bed; transferring to go to bathroom with nursing; NAD HENT: conjugate gaze; oropharynx moist CV: regular rate; no JVD Pulmonary: CTA B/L; no W/R/R- good air movement GI: soft, NT, ND, (+)BS- more normoactive Psychiatric: appropriate Neurological: Ox3  - still has large deep purple bruise on posterior aspect of L distal thigh. -- is noticeable- , not wearing L BKA prosthesis yet Musculoskeletal:  Measured B/L mid thigh- L side is 17.5 inches and R side 17.75 inches    Cervical back: Normal range of motion.     Comments: Mild swelling LLE. Left hip with significant tenderness with movement and palpation.   TTP around L hip where has fx. -reports this is gradually improving Skin:    General: Skin is warm and dry.  Neurological:     Mental Status: He is alert.     Comments: Alert and oriented x 3. Normal insight and awareness. Follows commands, Intact Memory.  Normal language and speech. Cranial nerve exam unremarkable. MMT: UE 5/5. RLE 4/5. LLE 3/5 HF, KE d/t pain. Sensory exam normal for light touch and pain in all 4 limbs. No limb ataxia or cerebellar signs. No abnormal tone appreciated.  .     Assessment/Plan: 1. Functional deficits which require 3+ hours per day of interdisciplinary therapy in a comprehensive inpatient rehab setting. Physiatrist is providing close team supervision and 24 hour management of active medical problems listed below. Physiatrist and rehab team continue to assess barriers to discharge/monitor patient progress toward functional and medical goals  Care Tool:  Bathing    Body parts bathed by patient: Right arm, Left arm, Chest, Abdomen, Front perineal area, Right upper leg, Left upper leg   Body parts bathed by helper: Right lower leg, Buttocks Body parts n/a: Left lower leg   Bathing assist Assist Level: Moderate Assistance - Patient 50 - 74%     Upper Body Dressing/Undressing Upper  body dressing   What is the patient wearing?: Pull over shirt    Upper body assist Assist Level: Set up assist    Lower Body Dressing/Undressing Lower body dressing      What is the patient wearing?: Incontinence brief, Pants     Lower body assist Assist for lower body dressing: Moderate Assistance - Patient 50 - 74%     Toileting Toileting    Toileting assist Assist for toileting: Maximal Assistance - Patient 25 - 49%     Transfers Chair/bed transfer  Transfers assist  Chair/bed transfer activity did not occur: Safety/medical concerns  Chair/bed transfer assist level: Contact Guard/Touching assist Chair/bed transfer assistive device: Geologist, engineering   Ambulation assist   Ambulation activity did not occur: Safety/medical concerns  Assist level: Supervision/Verbal cueing Assistive device: Walker-rolling Max distance: 168ft   Walk 10 feet activity   Assist  Walk 10 feet activity did not  occur: Safety/medical concerns  Assist level: Supervision/Verbal cueing Assistive device: Walker-rolling   Walk 50 feet activity   Assist Walk 50 feet with 2 turns activity did not occur: Safety/medical concerns  Assist level: Supervision/Verbal cueing Assistive device: Walker-rolling    Walk 150 feet activity   Assist Walk 150 feet activity did not occur: Safety/medical concerns  Assist level: Supervision/Verbal cueing Assistive device: Walker-rolling    Walk 10 feet on uneven surface  activity   Assist Walk 10 feet on uneven surfaces activity did not occur: Safety/medical concerns         Wheelchair     Assist Is the patient using a wheelchair?: Yes Type of Wheelchair: Manual    Wheelchair assist level: Supervision/Verbal cueing Max wheelchair distance: 150    Wheelchair 50 feet with 2 turns activity    Assist        Assist Level: Supervision/Verbal cueing   Wheelchair 150 feet activity     Assist      Assist Level: Supervision/Verbal cueing   Blood pressure (!) 147/55, pulse (!) 58, temperature 98.1 F (36.7 C), resp. rate 18, height 5\' 5"  (1.651 m), weight 76.6 kg, SpO2 99 %.  Medical Problem List and Plan: 1. Functional deficits secondary to left greater trochanter fracture             -pt with prior left BKA and prosthesis- incorporate donning/doffing, use of prosthetic into therapy plan/goals. Socket a little tighter than usual due to sl swelling in LLE from fracture and not wearing prosthesis as much.             -patient may  shower             -ELOS/Goals: 10 days, mod I to supervision with PT, OT   D/c- 5/10  Con't CIR PT and OT 2.  Antithrombotics: -DVT/anticoagulation:  Pharmaceutical: Eliquis             -antiplatelet therapy:    3. Pain Management: Tylenol as needed for mild pain             -hydrocodone prn for severe pain             -robaxin prn for spasms             -prn ice   4/27- pain usually controlled per  pt- con't regimen- see how he does in therapies  4/30- Pain this AM- waiting for pain meds-   -5/5 hip pain controlled with hydrocodone, reports gradually improving  5/6- pain doing better, not resolved- meds  more helpful  5/8- pain doing better- best night of all  5/9- will need to go home with pain meds- 7 days and then can call my clinic for refills 4. Mood/Behavior/Sleep: LCSW to evaluate and provide emotional support 5/2- insomnia- called pharmacy- will start Restoril 7.5 mg QHS prn  5/3- slept much better- wants ot con't Restoril             -antipsychotic agents: n/a   -5/4 seen by neuropsych for coping and adjustment 5. Neuropsych/cognition: This patient is capable of making decisions on his own behalf.   6. Skin/Wound Care: Routine skin care checks   7. Fluids/Electrolytes/Nutrition: Routine Is and Os and follow-up chemistries             -continue vitamin B12 supplements, oral irom   8: Hypertension: monitor TID and prn (lasix 40 mg not restarted)             -continue amlodipine 10 mg daily             -continue atenolol 25 mg daily             -continue hydralazine 75 mg BID   4/27- BP somewhat elevated in 160s this Am systolic- usually 140s systolic- will monitor for trend for another 24 hours before changing meds  4/28-4/29 BP doing better- con't regimen  5/3- BP very slightly elevated usually controlled- could be pain? Will monitor trend  -5/5 BP controlled but a little soft at times, dc atenolol to 12.5mg  daily  5/6- BP a little elevated this AM-will monitor for trend  5/7- BP slightly elevated with reduction in Atenolol- will wait to increase, since was bradycardic- now in 60s HR    12/17/2022    4:56 AM 12/16/2022    9:00 PM 12/16/2022   12:59 PM  Vitals with BMI  Systolic 147 147 161  Diastolic 55 58 49  Pulse 58 54 45    9: Hyperlipidemia: continue statin   10: Atrial fibrillation, chronic: rate controlled; continue Eliquis, BB  -5/5 HR bradycardic at  times-appears to be somewhat chronic, will decrease atenolol to 12.5   11: CAD: stable; s/p CABG 2000; balloon angioplasty 2003             -follows with Dr. Tenny Craw   12: OSA:    13: Anemia: continue oral iron; follow-up CBC   14: Left greater trochanter displacement; non-opertive management             -WBAT   5/1- pain really impacting therapy -  5/3- pain doing better per pt/staff  5/7- pain doing much better- walked 153 ft this AM 15: Urinary retention/BPH: continue Proscar and Flomax             -monitor output/PVR's             -OOB to void when possible  4/28- Changed Flomax to q supper- also on Proscar- having bleeding form penis- could be why had blood clots- UTI can explain, but will recheck labs in AM to make sure Hb stable- so far, is 9.9 and stable from prior days. 4/29-  Hb has dropped some 9.3- will recheck in Am and if drops again, will call Urology.   4/30- Hb up to 10.1 and urine now clear 5/2- required a cath last night/this AM - x1- on max dose of Flomax- will add Urecholine 5 mg TID to help-   5/3- per pt, has hx of urinary retention- not emptying- for "months"-  asked nursing and wrote order to teach pt in/out caths. Could increase Urecholine this weekend if need be.   5/5 will hold off on increase urecholine today as intermittently bradycardic, blood tinged urine/hematuria with traumatic cath yesterday-appears improving. Try coude cath for IC 5/6- No cath since yesterday AM- voiding better 5/7- cathed last night for 400cc at 1am- still requiring intermittently- maxxed out on meds 5/8- wasn't cathed last night- is trying to double void and says been doing this for awhile, even before admission 5/9- no cath for 48 hours- but pt/son trained in caths to go home with supplies 16: DM-2: CBGs QID; carb modified diet; A1c 4.8% on 4/08 (diet controlled at home)             -on SSI but not requiring coverage   4/27- CBG's- 98-171- usually below 130- will reduce CBGs to BID-AC  since doing well and A1c is 4.8  5/2- stopped SSI/CBGs 17: AKI atop CKD 3: baseline creatinine ~1.3 ??             -follow-up BMP  4/27- will recheck Sunday AM and con't to check weekly   4/28- Cr up slightly to 1.41 from 1.34- baseline Cr 1.3- pt drinking ~ 3 cups/water /day- asked him to drink 6-8 cups/day and will recheck in AM  4/29- Cr stable at 1.4- will con't topush fluids by [facility] Thursday  4/30- will recheck in AM since BUN/Cr slightly rising.   5/1- Cr down to 1.33 and BUN up to 35- likely dry- since was scared of being cathed.   5/6- Cr up slightly to 1.49- think pt is drinking less to void less- will d/w pt   5/8- Cr down to 1.18 and BUN 40 (37)- will d/w pt to drink more  5/9- pt doesn't want to drink more- advised to try, please 18: PAD: s/p left BKA 2019 Dr. Wyn Quaker             -continue Trental   19: Leukocytosis: afebrile; on Zosyn for UTI, started today 4/26             -follow-up sensitives -follow-up CBC 4/27- will recheck in AM/Sunday - is afebrile 4/28- WBC down to 10.5 from 12.4 4/29- WBC down to 9.7k 4/30- WBC back up to 12.9- likely because didn't get Cipro treatment as recommended- will recheck in AM 5/6- WBC up slightly to 11.5- will recheck Wednesday 5/7- Checking CXR since sounds coarse- mildly SOB- will monitor closely.  5/9- CXR looks good- nothing acute 21: Pseudomonas (and E Coli) UTI             -continue Zosyn iv x 3 days at least then po cipro x 3 more days per [facility]  4/29- changing to Cipro today- having blood clots and sediment in urine- likely due to UTI  4/30- Cipro didn't start yesterday when Zosyn stopped for some reason- will start- since WBC up to 12.9- -will recheck labs in AM  5/1- WBC down to 10.7- so appears like right medicine 5/8- WBC 9.2- doing better  22. Urinary retention  4/28- started Flomax- needed in/out cath due to retention- likely due to UTI and constipation  4/29- peed this Am; required cath last night-overnight- still  has blood clots- per documentation. But voiding better  4/30- no cath since yesterday am and pt reports not bloody when pees.   5/3- still requiring caths- will start teaching- and let SW know will likely go home cathing.   5/8- still requiring caths  intermittently  5/9- last cath 48 hours ago- will go home with cath supplies and has been trained 23. Constipation  4/28- pt has stool ball, it sounds like- trying to break up- also ordered Sorbitol 45cc this AM- and asked nursing to do digital disimpaction if need be.   4/29- LBM yesterday x2- feels better- will increase bowel meds- add Senokot-S 1 tab BID  4/30- LBM yesterday  5/2- LBM 4/30- nursing ot give Sorbitol today after therapy-will add Miralax to meds  5/3- LBM yesterday x2 with sorbitol-   5/4 LBM today improved  5/6- LBM x3 yesterday 24. L BKA- old  5/1- had some redness 4/30 so foam placed- looks better today- spoke with PT- will con't to keep an eye on it- pain is doing better  5/2- skin looks great this AM- no pain on "nub".   5/7- doing MUCH better since [employer] made "cutouts" on prosthesis. Fitting much better and not rubbing  25. Sinus drainage/mild SOB  5/6- will add Flonase daily.   5/7- sounds very coarse- will get CXR and go over- might need Lasix? 26. L thigh swelling  5/7- pt insists L thigh is swollen, however per measurements- L thigh is 17.5 inches and R thigh in same locations is 17.75 inches- does have large purple bruising- Hb stable over last 1 week.  26. Insomnia  5/8- Didn't sleep last night- doesn't want to change sleeping meds.   5/9- slept better   LOS: 13 days A FACE TO FACE EVALUATION WAS PERFORMED  Eric [name] 12/17/2022, 9:49 AM

## 2022-12-17 NOTE — Progress Notes (Signed)
Inpatient Rehabilitation Discharge Medication Review by a Pharmacist  A complete drug regimen review was completed for this patient to identify any potential clinically significant medication issues.  High Risk Drug Classes Is patient taking? Indication by Medication  Antipsychotic No   Anticoagulant Yes Apixaban- Afib  Antibiotic No   Opioid Yes Hydrocodone/APAP-pain management  Antiplatelet No   Hypoglycemics/insulin No   Vasoactive Medication Yes Amlodipine, Atenolol, Hydralazine- HTN  Chemotherapy No   Other Yes Cyanocobalamin - Vitamin B12 supplement Ferrous sulfate- chronic anemia. Pentoxifylline - PAD Proscar, Tamsulosin, bethanacol- BPH, urinary retention.  Rosuvastatin- HLD Flonase - allergy Restoril - sleep     Type of Medication Issue Identified Description of Issue Recommendation(s)  Drug Interaction(s) (clinically significant)     Duplicate Therapy     Allergy     No Medication Administration End Date     Incorrect Dose     Additional Drug Therapy Needed     Significant med changes from prior encounter (inform family/care partners about these prior to discharge).     Other       Clinically significant medication issues were identified that warrant physician communication and completion of prescribed/recommended actions by midnight of the next day:  No  Name of provider notified for urgent issues identified:   Provider Method of Notification:     Pharmacist comments:   Time spent performing this drug regimen review (minutes):  20  Ulyses Southward, PharmD, [place], AAHIVP, CPP Infectious Disease Pharmacist 12/17/2022 9:41 AM

## 2022-12-17 NOTE — Progress Notes (Signed)
Physical Therapy Discharge Summary  Patient Details  Name: Eric [name]. MRN: [id number] Date of Birth: 04-26-1928  Date of Discharge from PT service:Dec 17, 2022  Today's Date: 12/17/2022 PT Individual Time: 1020-1100, 1400-1430  PT Individual Time Calculation (min): 40 min , 30 min  Missed 30 minutes due to fatigue    Patient has met 9 of 10 long term goals due to improved activity tolerance, improved balance, improved postural control, increased strength, increased range of motion, decreased pain, ability to compensate for deficits, and improved coordination.  Patient to discharge at a wheelchair level Modified Independent. Pt is ambulatory at supervision level for household distances with RW. Pt's family participated in education and demonstrate competency with providing necessary assistance to patient.   Reasons goals not met: Unable to meet bed mobility goal 2/2 pain.   Recommendation:  Patient will benefit from ongoing skilled PT services in home health setting to continue to advance safe functional mobility, address ongoing impairments in coordination, balance, , and minimize fall risk.  Equipment: No equipment provided  Reasons for discharge: treatment goals met and discharge from hospital  Patient/family agrees with progress made and goals achieved: Yes  PT Discharge  Pain Interference Pain Interference Pain Effect on Sleep: 2. Occasionally Pain Interference with Therapy Activities: 0. Does not apply - I have not received rehabilitationtherapy in the past 5 days Pain Interference with Day-to-Day Activities: 1. Rarely or not at all Cognition Overall Cognitive Status: Within Functional Limits for tasks assessed Arousal/Alertness: Awake/alert Orientation Level: Oriented X4 Memory: Appears intact Awareness: Appears intact Problem Solving: Appears intact Safety/Judgment: Appears intact Sensation Sensation Light Touch: Impaired Detail Peripheral sensation comments:  peirpheral neruopathy R LE Coordination Gross Motor Movements are Fluid and Coordinated: Yes Fine Motor Movements are Fluid and Coordinated: Yes Coordination and Movement Description: grossly coordinated with mobility Finger Nose Finger Test: [facility] Heel Shin Test: R LE intact, L LE limited ROM Motor  Motor Motor: Within Functional Limits Motor - Skilled Clinical Observations: Grossly coordinated with functional mobility  Mobility Bed Mobility Bed Mobility: Sit to Supine;Supine to Sit;Rolling Right;Rolling Left Rolling Left: Moderate Assistance - Patient 50-74% Supine to Sit: Moderate Assistance - Patient 50-74% Sit to Supine: Moderate Assistance - Patient 50-74% Transfers Transfers: Sit to Stand;Stand to Sit;Stand Pivot Transfers Sit to Stand: Supervision/Verbal cueing Stand to Sit: Supervision/Verbal cueing Stand Pivot Transfers: Supervision/Verbal cueing Transfer (Assistive device): Rolling walker Locomotion  Gait Ambulation: Yes Gait Assistance: Supervision/Verbal cueing Gait Distance (Feet): 150 Feet Assistive device: Rolling walker Gait Gait: Yes Gait Pattern: Impaired Gait Pattern: Antalgic Stairs / Additional Locomotion Stairs: No Ramp: Contact Guard/touching assist Wheelchair Mobility Wheelchair Mobility: Yes Wheelchair Assistance: Independent with assistive device Wheelchair Propulsion: Both upper extremities Wheelchair Parts Management: Independent Distance: 300 ft  Trunk/Postural Assessment  Cervical Assessment Cervical Assessment: Exceptions to [facility] (forward head) Thoracic Assessment Thoracic Assessment: Exceptions to [facility] (rounded shoulders) Lumbar Assessment Lumbar Assessment: Exceptions to [facility] (posterior pelvic tilt) Postural Control Postural Control: Within Functional Limits  Balance Balance Balance Assessed: Yes Static Sitting Balance Static Sitting - Balance Support: Feet supported Static Sitting - Level of Assistance: 7: Independent Dynamic  Sitting Balance Dynamic Sitting - Balance Support: Feet supported Dynamic Sitting - Level of Assistance: 6: Modified independent (Device/Increase time) Static Standing Balance Static Standing - Balance Support: During functional activity;Bilateral upper extremity supported Static Standing - Level of Assistance: 5: Stand by assistance (Supervision) Dynamic Standing Balance Dynamic Standing - Balance Support: During functional activity;Bilateral upper extremity supported Dynamic Standing - Level  of Assistance: 5: Stand by assistance (CGA) Dynamic Standing - Balance Activities: Lateral lean/weight shifting;Forward lean/weight shifting Extremity Assessment  RLE Assessment RLE Assessment: Exceptions to [facility] RLE Strength Right Hip Flexion: 4/5 Right Knee Flexion: 4/5 Right Knee Extension: 4/5 Right Ankle Dorsiflexion: 4/5 Right Ankle Plantar Flexion: 4/5 LLE Assessment LLE Assessment: Exceptions to WFL LLE Strength LLE Overall Strength Comments: unable to assess L hip abduction due to restrictions Left Hip Extension: 3/5 Left Knee Flexion: 3+/5 Left Knee Extension: 3+/5   Treatment Session 1:   Pt received seated in w/c at bedside, agreeable to PT session. Pt with unrated L LE pain that decreased with repositioning.   PT assessed pain interference, sensation, coordination, strength and balance in preparation for discharge.   Pt Mod I with w/c management and propulsion ~ 150 ft to dayroom. PT provided pt with handout with information on how to obtain skin inspection mirror at patient's request.   Pt ambulated ~170 ft with RW (S) for safety with 3 brief standing rest breaks for pain relief. Pt propelled w/c to room and left seated at bedside with all needs in reach and alarm on.   Treatment Session 2:   Pt received seated in w/c at bedside and agreeable to PT session. Pt's daughter present for education. Pt with unrated L LE pain, provided rest and repositioning for relief. Pt  propelled w/c >300 ft mod I to ortho gym and requires (S) for ambulatory car transfer by patient's family. Pt removed prosthesis and able to sit within simulated car. Pt transitioned to gait training with family supervision and ambulated ~50 ft and requested to rest due to fatigue and pain. Pt propelled w/c to hospital room and left seated in recliner at bedside with all needs in reach and chair alarm on.   [name] [name] PT, DPT  12/17/2022, 10:34 AM

## 2022-12-17 NOTE — Plan of Care (Signed)
  Problem: RH Balance Goal: LTG Patient will maintain dynamic standing with ADLs (OT) Description: LTG:  Patient will maintain dynamic standing balance with assist during activities of daily living (OT)  Outcome: Completed/Met   Problem: Sit to Stand Goal: LTG:  Patient will perform sit to stand in prep for activites of daily living with assistance level (OT) Description: LTG:  Patient will perform sit to stand in prep for activites of daily living with assistance level (OT) Outcome: Completed/Met   Problem: RH Bathing Goal: LTG Patient will bathe all body parts with assist levels (OT) Description: LTG: Patient will bathe all body parts with assist levels (OT) Outcome: Completed/Met   Problem: RH Dressing Goal: LTG Patient will perform lower body dressing w/assist (OT) Description: LTG: Patient will perform lower body dressing with assist, with/without cues in positioning using equipment (OT) Outcome: Completed/Met   Problem: RH Toileting Goal: LTG Patient will perform toileting task (3/3 steps) with assistance level (OT) Description: LTG: Patient will perform toileting task (3/3 steps) with assistance level (OT)  Outcome: Completed/Met   Problem: RH Toilet Transfers Goal: LTG Patient will perform toilet transfers w/assist (OT) Description: LTG: Patient will perform toilet transfers with assist, with/without cues using equipment (OT) Outcome: Completed/Met   Problem: RH Tub/Shower Transfers Goal: LTG Patient will perform tub/shower transfers w/assist (OT) Description: LTG: Patient will perform tub/shower transfers with assist, with/without cues using equipment (OT) Outcome: Completed/Met   

## 2022-12-17 NOTE — Progress Notes (Signed)
Occupational Therapy Discharge Summary  Patient Details  Name: Eric [name]. MRN: [id number] Date of Birth: [birth year]  Date of Discharge from OT service:Dec 17, 2022  Today's Date: 12/17/2022 OT Individual Time: 1300-1355 1st Session, 1300-1355 2nd Session  OT Individual Time Calculation (min): 55 min, 55 min    Patient has met 7 of 7 long term goals due to improved activity tolerance, improved balance, postural control, ability to compensate for deficits, and functional use of  LEFT upper and LEFT lower extremity.  Patient to discharge at overall Supervision level.  Patient's care partner is independent to provide the necessary physical assistance at discharge.    Reasons goals not met: n/a  Recommendation:  Patient will benefit from ongoing skilled OT services in home health setting to continue to advance functional skills in the area of BADL, iADL, and Reduce care partner burden.  Equipment: RW bag, LH sponge   Reasons for discharge: treatment goals met  Patient/family agrees with progress made and goals achieved: Yes  OT Discharge Precautions/Restrictions  Precautions Precautions: Fall;Other (comment) Precaution Comments: h/o L BKA (has prosthetic), L LE WBAT, no active hip abduction Restrictions Weight Bearing Restrictions: Yes LLE Weight Bearing: Weight bearing as tolerated General PT Missed Treatment Reason: Patient fatigue Vital Signs  Pain Pain Assessment Pain Scale: 0-10 Pain Score: 1  Pain Type: Acute pain Pain Location: Hip Pain Orientation: Left Pain Descriptors / Indicators: Aching Pain Onset: On-going Patients Stated Pain Goal: 0 Pain Intervention(s): Pain med given for lower pain score than stated, per patient request;Rest;Repositioned ADL ADL Equipment Provided: Long-handled shoe horn, Other (comment) (skin inspection mirror and RW bag) Eating: Independent Where Assessed-Eating: Wheelchair Grooming: Modified independent Where  Assessed-Grooming: Sitting at sink Upper Body Bathing: Modified independent Where Assessed-Upper Body Bathing: Sitting at sink Lower Body Bathing: Modified independent Where Assessed-Lower Body Bathing: Standing at sink, Sitting at sink Upper Body Dressing: Independent Lower Body Dressing: Modified independent Where Assessed-Lower Body Dressing: Standing at sink, Sitting at sink Toileting: Modified independent Where Assessed-Toileting: Teacher, adult education: Close supervision Statistician Method: Proofreader: Chiropractor Transfer: Close supervison Web designer Method: Ship broker: Insurance underwriter: Close supervision Film/video editor Method: Designer, industrial/product: Shower seat with back ADL Comments: pt now Mod I with dressing, toileting and grooming. Close S for TTB and toilet transfers amb level. Vision Baseline Vision/History: 1 Wears glasses Patient Visual Report: No change from baseline Vision Assessment?: No apparent visual deficits Perception  Perception: Within Functional Limits Praxis Praxis: Intact Cognition Cognition Overall Cognitive Status: Within Functional Limits for tasks assessed Arousal/Alertness: Awake/alert Orientation Level: Person;Place;Situation Person: Oriented Memory: Appears intact Attention: Focused;Sustained Focused Attention: Appears intact Sustained Attention: Appears intact Awareness: Appears intact Problem Solving: Appears intact Safety/Judgment: Appears intact Brief Interview for Mental Status (BIMS) Repetition of Three Words (First Attempt): 3 Temporal Orientation: Year: Correct Temporal Orientation: Month: Accurate within 5 days Temporal Orientation: Day: Correct Recall: "Sock": Yes, no cue required Recall: "Blue": Yes, no cue required Recall: "Bed": Yes, no cue required BIMS Summary Score: 15 Sensation Sensation Light  Touch: Impaired Detail Peripheral sensation comments: peirpheral neruopathy R LE Light Touch Impaired Details: Impaired RLE Hot/Cold: Appears Intact Proprioception: Appears Intact Proprioception Impaired Details: Impaired RLE Stereognosis: Appears Intact Coordination Gross Motor Movements are Fluid and Coordinated: Yes Fine Motor Movements are Fluid and Coordinated: Yes Coordination and Movement Description: grossly coordinated with mobility and WNL's UE's Finger Nose Finger Test: [facility] Motor  Motor Motor:  Within Functional Limits Motor - Skilled Clinical Observations: Grossly coordinated with functional mobility Mobility  Bed Mobility Bed Mobility: Sit to Supine;Supine to Sit;Rolling Right;Rolling Left Rolling Right: Contact Guard/Touching assist Rolling Left: Contact Guard/Touching assist Supine to Sit: Contact Guard/Touching assist Sit to Supine: Contact Guard/Touching assist Transfers Sit to Stand: Supervision/Verbal cueing Stand to Sit: Supervision/Verbal cueing  Trunk/Postural Assessment  Cervical Assessment Cervical Assessment: Exceptions to [facility] Thoracic Assessment Thoracic Assessment: Exceptions to [facility] Lumbar Assessment Lumbar Assessment: Exceptions to [facility] Postural Control Postural Control: Within Functional Limits  Balance Balance Balance Assessed: Yes Static Sitting Balance Static Sitting - Balance Support: Feet supported Static Sitting - Level of Assistance: 7: Independent Dynamic Sitting Balance Dynamic Sitting - Balance Support: Feet supported Dynamic Sitting - Level of Assistance: 6: Modified independent (Device/Increase time) Sitting balance - Comments: dificulty with forward reaching due to hip pain Static Standing Balance Static Standing - Balance Support: During functional activity;Bilateral upper extremity supported Static Standing - Level of Assistance: 5: Stand by assistance Dynamic Standing Balance Dynamic Standing - Balance Support: During  functional activity;Bilateral upper extremity supported Dynamic Standing - Level of Assistance: 5: Stand by assistance Dynamic Standing - Balance Activities: Lateral lean/weight shifting;Forward lean/weight shifting Extremity/Trunk Assessment RUE Assessment RUE Assessment: Within Functional Limits LUE Assessment LUE Assessment: Within Functional Limits Session 1: Pt seen for initial part of d/c sessions this day for skilled OT services. Pt up in w/c in room at atart of session. Able to demo all basic self care including dressing, grooming and urinal use with L prosthesis and RW support.  Pain: 1/10 at rest with increased to 4/10 with amb in L hip region with pain meds taken earlier and repositioning    Session 2: Pt seen for skilled OT with focus on 2nd round of Family Education with 2 other children, 2 dtrs, who will also care for pt at home.  OT initiated training with toileting and pt able to amb with dtr into bathroom from w/c and back with transfer to toilet with close S and rails surround. OT educ on clothing mngt support as needed. Pt amb from toilet to sink with close S and dtr support for hand washing.  Educ on skin inspection, including skin inspection mirror as well as use of gait belt with amb, RW bag for light item transport. OT transported pt with family to demo apt space for TTB transfer with dtrs assisting safely. Amb in kitchen with RW with close S 20 ft x 2. Transported back to room and OT training continued with light UE tband therex with demo and understanding teach back. Left pt w/c level with chair alarm set, needs, and nurse call button in reach. No further questions or concerns reported and d/c from CIR OT.   Pain: 2/10 at rest in L hip region with 4/10 increased during transfers and kitchen mobility. Repositioning relieves  Vicenta Dunning 12/17/2022, 4:24 PM

## 2022-12-17 NOTE — Progress Notes (Signed)
Patient ID: Eric [name]., male   DOB: [birth year], 89 y.o.   MRN: [id number]  Met with pt and two daughter's who reports family education went well and both are comfortable with his care at discharge. Son coming tomorrow to go over DC instructions aware of time between 9-11. See in am for any last minute questions.

## 2022-12-17 NOTE — Progress Notes (Signed)
Inpatient Rehabilitation Care Coordinator Discharge Note   Patient Details  Name: Eric [name]. MRN: [id number] Date of Birth: 20-Feb-1928   Discharge location: HOME WITH WIFE AND FOUR CHILDRE WILL BE COMING IN AND OUT  Length of Stay: 14 DAYS  Discharge activity level: SUPERVISION-CGA LEVEL  Home/community participation: ACTIVE  Patient response WU:[id number] Literacy - How often do you need to have someone help you when you read instructions, pamphlets, or other written material from your doctor or pharmacy?: Never  Patient response YN:WGNFAO Isolation - How often do you feel lonely or isolated from those around you?: Never  Services provided included: MD, RD, PT, OT, RN, CM, TR, Pharmacy, Neuropsych, SW  Financial Services:  Field seismologist Utilized: [employer]VANTAGE  Choices offered to/list presented to: PT AND SON  Follow-up services arranged:  Home Health, Patient/Family has no preference for HH/DME agencies Home Health Agency: [facility]  PT  OT  RN   HAS ALL NEEDED EQUIPMENT ORDERED AN ADJUSTABLE BED FOR HOME.      Patient response to transportation need: Is the patient able to respond to transportation needs?: Yes In the past 12 months, has lack of transportation kept you from medical appointments or from getting medications?: No In the past 12 months, has lack of transportation kept you from meetings, work, or from getting things needed for daily living?: No   Patient/Family verbalized understanding of follow-up arrangements:  Yes  Individual responsible for coordination of the follow-up plan: SELF AND KEITH-SON [phone]  Confirmed correct DME delivered: Lucy Chris 12/17/2022    Comments (or additional information): PT HAS STARTED VOIDING ON HIS OWN BUT HAVE ORDERED CATH VIA [facility]ERO [facility] WHICH WILL BE SHIPPED TO HIS HOME, IF NEEDED. FAMILY-ALL FOUR CHILDREN WERE HERE FOR HANDS ON EDUCATION AND FEEL COMFORTABLE WITH CARE  OF DAD AT DISCHARGE.  Summary of Stay    Date/Time Discharge Planning CSW  12/15/22 [phone] All four children will be coming in this week to learn pt's care in prepartion for DC home Friday. Wife can be there but not assist has own health issues. Pt learning how to I & O cath will need 1-2x per day according to MD. Ordering DME and HH RGD  12/08/22 1143 Pt will d/c to home with support from wife. One of their children will be at the home daily. SW will confirm there are no barriers to discharge. AAC       Lynnox Girten, Lemar Livings

## 2022-12-18 DIAGNOSIS — G4700 Insomnia, unspecified: Secondary | ICD-10-CM

## 2022-12-18 NOTE — Progress Notes (Signed)
PROGRESS NOTE   Subjective/Complaints:  Patient getting ready to go home this morning.  Reports pain under control.  No additional concerns or complaints.  He denies constipation reports he had recent bowel movement this morning.   ROS:   Pt denies fever, chills, SOB, abd pain, CP, N/V/C/D, and vision changes   Except for HPI  Objective:   No results found. Recent Labs    12/16/22 0502  WBC 9.2  HGB 9.7*  HCT 30.1*  PLT 254    Recent Labs    12/16/22 0502  NA 135  K 4.5  CL 108  CO2 21*  GLUCOSE 90  BUN 37*  CREATININE 1.18  CALCIUM 8.8*     Intake/Output Summary (Last 24 hours) at 12/18/2022 1517 Last data filed at 12/18/2022 0830 Gross per 24 hour  Intake 654 ml  Output 600 ml  Net 54 ml         Physical Exam: Vital Signs Blood pressure 139/70, pulse 66, temperature 98.4 F (36.9 C), resp. rate 18, height 5\' 5"  (1.651 m), weight 76.6 kg, SpO2 98 %.             General: awake, alert, appropriate, NAD, getting his items together for discharge HENT: conjugate gaze; oropharynx moist CV: regular rate; no JVD Pulmonary: CTA B/L; no W/R/R- good air movement GI: soft, NT, ND, (+)BS Psychiatric: appropriate, pleasant Neurological: Ox3  - still has large deep purple bruise on posterior aspect of L distal thigh. -- is noticeable- , not wearing L BKA prosthesis yet Musculoskeletal:     Cervical back: Normal range of motion.     Comments: Mild swelling LLE. Left hip with significant tenderness with movement and palpation.   TTP around L hip where has fx. -reports this is gradually improving Skin:    General: Skin is warm and dry.  Neurological:     Mental Status: He is alert.     Comments: Alert and awake, normal insight and awareness. Follows commands, Intact Memory. Normal language and speech. Cranial nerve exam unremarkable. MMT: UE 5/5. RLE 4/5. LLE 3/5 HF, KE d/t pain. Sensory exam  normal for light touch  in all 4 limbs. No limb ataxia or cerebellar signs. No abnormal tone appreciated.  .     Assessment/Plan: 1. Functional deficits which require 3+ hours per day of interdisciplinary therapy in a comprehensive inpatient rehab setting. Physiatrist is providing close team supervision and 24 hour management of active medical problems listed below. Physiatrist and rehab team continue to assess barriers to discharge/monitor patient progress toward functional and medical goals  Care Tool:  Bathing    Body parts bathed by patient: Right arm, Left arm, Chest, Abdomen, Front perineal area, Right upper leg, Left upper leg, Buttocks, Right lower leg, Face   Body parts bathed by helper: Right lower leg, Buttocks Body parts n/a: Left lower leg   Bathing assist Assist Level: Independent with assistive device     Upper Body Dressing/Undressing Upper body dressing   What is the patient wearing?: Pull over shirt    Upper body assist Assist Level: Independent    Lower Body Dressing/Undressing Lower body dressing  What is the patient wearing?: Incontinence brief, Pants     Lower body assist Assist for lower body dressing: Independent with assitive device     Toileting Toileting    Toileting assist Assist for toileting: Independent with assistive device     Transfers Chair/bed transfer  Transfers assist  Chair/bed transfer activity did not occur: Safety/medical concerns  Chair/bed transfer assist level: Supervision/Verbal cueing Chair/bed transfer assistive device: Geologist, engineering   Ambulation assist   Ambulation activity did not occur: Safety/medical concerns  Assist level: Supervision/Verbal cueing Assistive device: Walker-rolling Max distance: 173ft   Walk 10 feet activity   Assist  Walk 10 feet activity did not occur: Safety/medical concerns  Assist level: Supervision/Verbal cueing Assistive device: Walker-rolling   Walk  50 feet activity   Assist Walk 50 feet with 2 turns activity did not occur: Safety/medical concerns  Assist level: Supervision/Verbal cueing Assistive device: Walker-rolling    Walk 150 feet activity   Assist Walk 150 feet activity did not occur: Safety/medical concerns  Assist level: Supervision/Verbal cueing Assistive device: Walker-rolling    Walk 10 feet on uneven surface  activity   Assist Walk 10 feet on uneven surfaces activity did not occur: Safety/medical concerns   Assist level: Contact Guard/Touching assist Assistive device: Walker-rolling   Wheelchair     Assist Is the patient using a wheelchair?: Yes Type of Wheelchair: Manual    Wheelchair assist level: Independent Max wheelchair distance: 150    Wheelchair 50 feet with 2 turns activity    Assist        Assist Level: Independent   Wheelchair 150 feet activity     Assist      Assist Level: Independent   Blood pressure 139/70, pulse 66, temperature 98.4 F (36.9 C), resp. rate 18, height 5\' 5"  (1.651 m), weight 76.6 kg, SpO2 98 %.  Medical Problem List and Plan: 1. Functional deficits secondary to left greater trochanter fracture             -pt with prior left BKA and prosthesis- incorporate donning/doffing, use of prosthetic into therapy plan/goals. Socket a little tighter than usual due to sl swelling in LLE from fracture and not wearing prosthesis as much.             -patient may  shower             -ELOS/Goals: 10 days, mod I to supervision with PT, OT   D/c- 5/10  -Okay for discharge today 2.  Antithrombotics: -DVT/anticoagulation:  Pharmaceutical: Eliquis             -antiplatelet therapy:    3. Pain Management: Tylenol as needed for mild pain             -hydrocodone prn for severe pain             -robaxin prn for spasms             -prn ice   4/27- pain usually controlled per pt- con't regimen- see how he does in therapies  4/30- Pain this AM- waiting for pain  meds-   -5/5 hip pain controlled with hydrocodone, reports gradually improving  5/6- pain doing better, not resolved- meds more helpful  5/8- pain doing better- best night of all  5/9- will need to go home with pain meds- 7 days and then can call my clinic for refills 4. Mood/Behavior/Sleep: LCSW to evaluate and provide emotional support 5/2- insomnia- called pharmacy- will start  Restoril 7.5 mg QHS prn  5/3- slept much better- wants ot con't Restoril             -antipsychotic agents: n/a   -5/4 seen by neuropsych for coping and adjustment 5. Neuropsych/cognition: This patient is capable of making decisions on his own behalf.   6. Skin/Wound Care: Routine skin care checks   7. Fluids/Electrolytes/Nutrition: Routine Is and Os and follow-up chemistries             -continue vitamin B12 supplements, oral irom   8: Hypertension: monitor TID and prn (lasix 40 mg not restarted)             -continue amlodipine 10 mg daily             -continue atenolol 25 mg daily             -continue hydralazine 75 mg BID   4/27- BP somewhat elevated in 160s this Am systolic- usually 140s systolic- will monitor for trend for another 24 hours before changing meds  4/28-4/29 BP doing better- con't regimen  5/3- BP very slightly elevated usually controlled- could be pain? Will monitor trend  -5/5 BP controlled but a little soft at times, dc atenolol to 12.5mg  daily  5/6- BP a little elevated this AM-will monitor for trend  5/7- BP slightly elevated with reduction in Atenolol- will wait to increase, since was bradycardic- now in 60s HR  -5/10 BP controlled overall, continue current regimen and follow-up with PCP    12/18/2022    5:24 AM 12/17/2022    7:48 PM 12/17/2022    4:56 AM  Vitals with BMI  Systolic 139 134 161  Diastolic 70 46 55  Pulse 66 52 58    9: Hyperlipidemia: continue statin   10: Atrial fibrillation, chronic: rate controlled; continue Eliquis, BB  -5/5 HR bradycardic at times-appears to  be somewhat chronic, will decrease atenolol to 12.5   11: CAD: stable; s/p CABG 2000; balloon angioplasty 2003             -follows with Dr. Tenny Craw   12: OSA:    13: Anemia: continue oral iron; follow-up CBC   14: Left greater trochanter displacement; non-opertive management             -WBAT   5/1- pain really impacting therapy -  5/3- pain doing better per pt/staff  5/7- pain doing much better- walked 153 ft this AM 15: Urinary retention/BPH: continue Proscar and Flomax             -monitor output/PVR's             -OOB to void when possible  4/28- Changed Flomax to q supper- also on Proscar- having bleeding form penis- could be why had blood clots- UTI can explain, but will recheck labs in AM to make sure Hb stable- so far, is 9.9 and stable from prior days. 4/29-  Hb has dropped some 9.3- will recheck in Am and if drops again, will call Urology.   4/30- Hb up to 10.1 and urine now clear 5/2- required a cath last night/this AM - x1- on max dose of Flomax- will add Urecholine 5 mg TID to help-   5/3- per pt, has hx of urinary retention- not emptying- for "months"- asked nursing and wrote order to teach pt in/out caths. Could increase Urecholine this weekend if need be.   5/5 will hold off on increase urecholine today as intermittently bradycardic, blood tinged  urine/hematuria with traumatic cath yesterday-appears improving. Try coude cath for IC 5/6- No cath since yesterday AM- voiding better 5/7- cathed last night for 400cc at 1am- still requiring intermittently- maxxed out on meds 5/8- wasn't cathed last night- is trying to double void and says been doing this for awhile, even before admission 5/9- no cath for 48 hours- but pt/son trained in caths to go home with supplies 16: DM-2: CBGs QID; carb modified diet; A1c 4.8% on 4/08 (diet controlled at home)             -on SSI but not requiring coverage   4/27- CBG's- 98-171- usually below 130- will reduce CBGs to BID-AC since doing well  and A1c is 4.8  5/2- stopped SSI/CBGs 17: AKI atop CKD 3: baseline creatinine ~1.3 ??             -follow-up BMP  4/27- will recheck Sunday AM and con't to check weekly   4/28- Cr up slightly to 1.41 from 1.34- baseline Cr 1.3- pt drinking ~ 3 cups/water /day- asked him to drink 6-8 cups/day and will recheck in AM  4/29- Cr stable at 1.4- will con't topush fluids by [facility] Thursday  4/30- will recheck in AM since BUN/Cr slightly rising.   5/1- Cr down to 1.33 and BUN up to 35- likely dry- since was scared of being cathed.   5/6- Cr up slightly to 1.49- think pt is drinking less to void less- will d/w pt   5/8- Cr down to 1.18 and BUN 40 (37)- will d/w pt to drink more  5/9- pt doesn't want to drink more- advised to try, please 18: PAD: s/p left BKA 2019 Dr. Wyn Quaker             -continue Trental   19: Leukocytosis: afebrile; on Zosyn for UTI, started today 4/26             -follow-up sensitives -follow-up CBC 4/27- will recheck in AM/Sunday - is afebrile 4/28- WBC down to 10.5 from 12.4 4/29- WBC down to 9.7k 4/30- WBC back up to 12.9- likely because didn't get Cipro treatment as recommended- will recheck in AM 5/6- WBC up slightly to 11.5- will recheck Wednesday 5/7- Checking CXR since sounds coarse- mildly SOB- will monitor closely.  5/9- CXR looks good- nothing acute 21: Pseudomonas (and E Coli) UTI             -continue Zosyn iv x 3 days at least then po cipro x 3 more days per [facility]  4/29- changing to Cipro today- having blood clots and sediment in urine- likely due to UTI  4/30- Cipro didn't start yesterday when Zosyn stopped for some reason- will start- since WBC up to 12.9- -will recheck labs in AM  5/1- WBC down to 10.7- so appears like right medicine 5/8- WBC 9.2- doing better  22. Urinary retention  4/28- started Flomax- needed in/out cath due to retention- likely due to UTI and constipation  4/29- peed this Am; required cath last night-overnight- still has blood clots-  per documentation. But voiding better  4/30- no cath since yesterday am and pt reports not bloody when pees.   5/3- still requiring caths- will start teaching- and let SW know will likely go home cathing.   5/8- still requiring caths intermittently  5/9- last cath 48 hours ago- will go home with cath supplies and has been trained  -5/10 PVRs 134 to 282 mL, has not required recent IC though he reports  he has been trained on this 23. Constipation  4/28- pt has stool ball, it sounds like- trying to break up- also ordered Sorbitol 45cc this AM- and asked nursing to do digital disimpaction if need be.   4/29- LBM yesterday x2- feels better- will increase bowel meds- add Senokot-S 1 tab BID  4/30- LBM yesterday  5/2- LBM 4/30- nursing ot give Sorbitol today after therapy-will add Miralax to meds  5/3- LBM yesterday x2 with sorbitol-   5/4 LBM today improved  5/6- LBM x3 yesterday  -5/10 last BM today improved 24. L BKA- old  5/1- had some redness 4/30 so foam placed- looks better today- spoke with PT- will con't to keep an eye on it- pain is doing better  5/2- skin looks great this AM- no pain on "nub".   5/7- doing MUCH better since [employer] made "cutouts" on prosthesis. Fitting much better and not rubbing  25. Sinus drainage/mild SOB  5/6- will add Flonase daily.   5/7- sounds very coarse- will get CXR and go over- might need Lasix? 26. L thigh swelling  5/7- pt insists L thigh is swollen, however per measurements- L thigh is 17.5 inches and R thigh in same locations is 17.75 inches- does have large purple bruising- Hb stable over last 1 week.  26. Insomnia  5/8- Didn't sleep last night- doesn't want to change sleeping meds.   5/10 reports he is sleeping better   LOS: 14 days A FACE TO FACE EVALUATION WAS PERFORMED  Fanny Dance 12/18/2022, 3:17 PM

## 2022-12-18 NOTE — Progress Notes (Signed)
Had a quite night. Slept after dose of temazepam given. Required pain medication this shift. Assisted to the bathroom as requested. Post Void residuals recorded. Gait steady with prosthesis and rolling walker. Safety maintained at all times.

## 2022-12-19 DIAGNOSIS — I129 Hypertensive chronic kidney disease with stage 1 through stage 4 chronic kidney disease, or unspecified chronic kidney disease: Secondary | ICD-10-CM | POA: Diagnosis not present

## 2022-12-19 DIAGNOSIS — I4891 Unspecified atrial fibrillation: Secondary | ICD-10-CM | POA: Diagnosis not present

## 2022-12-19 DIAGNOSIS — I739 Peripheral vascular disease, unspecified: Secondary | ICD-10-CM | POA: Diagnosis not present

## 2022-12-19 DIAGNOSIS — N183 Chronic kidney disease, stage 3 unspecified: Secondary | ICD-10-CM | POA: Diagnosis not present

## 2022-12-19 DIAGNOSIS — G4733 Obstructive sleep apnea (adult) (pediatric): Secondary | ICD-10-CM | POA: Diagnosis not present

## 2022-12-19 DIAGNOSIS — D638 Anemia in other chronic diseases classified elsewhere: Secondary | ICD-10-CM | POA: Diagnosis not present

## 2022-12-19 DIAGNOSIS — S72112D Displaced fracture of greater trochanter of left femur, subsequent encounter for closed fracture with routine healing: Secondary | ICD-10-CM | POA: Diagnosis not present

## 2022-12-19 DIAGNOSIS — Z7901 Long term (current) use of anticoagulants: Secondary | ICD-10-CM | POA: Diagnosis not present

## 2022-12-19 DIAGNOSIS — E1122 Type 2 diabetes mellitus with diabetic chronic kidney disease: Secondary | ICD-10-CM | POA: Diagnosis not present

## 2022-12-19 DIAGNOSIS — I2510 Atherosclerotic heart disease of native coronary artery without angina pectoris: Secondary | ICD-10-CM | POA: Diagnosis not present

## 2022-12-25 ENCOUNTER — Telehealth: Payer: Self-pay

## 2022-12-25 NOTE — Telephone Encounter (Signed)
Will Occupational Therapist with [facility] called to verbal orders for pt:  OT for 1x a week for 4 weeks  Please call Will OT with [facility] on secure line at [phone]

## 2022-12-28 ENCOUNTER — Ambulatory Visit (INDEPENDENT_AMBULATORY_CARE_PROVIDER_SITE_OTHER): Payer: PPO | Admitting: Family Medicine

## 2022-12-28 ENCOUNTER — Encounter: Payer: Self-pay | Admitting: Family Medicine

## 2022-12-28 VITALS — BP 122/62 | HR 57 | Temp 97.5°F | Ht 65.0 in | Wt 168.0 lb

## 2022-12-28 DIAGNOSIS — M25552 Pain in left hip: Secondary | ICD-10-CM | POA: Diagnosis not present

## 2022-12-28 DIAGNOSIS — D649 Anemia, unspecified: Secondary | ICD-10-CM | POA: Diagnosis not present

## 2022-12-28 DIAGNOSIS — S72115D Nondisplaced fracture of greater trochanter of left femur, subsequent encounter for closed fracture with routine healing: Secondary | ICD-10-CM

## 2022-12-28 DIAGNOSIS — I482 Chronic atrial fibrillation, unspecified: Secondary | ICD-10-CM | POA: Diagnosis not present

## 2022-12-28 DIAGNOSIS — M25531 Pain in right wrist: Secondary | ICD-10-CM | POA: Diagnosis not present

## 2022-12-28 DIAGNOSIS — R339 Retention of urine, unspecified: Secondary | ICD-10-CM

## 2022-12-28 DIAGNOSIS — I10 Essential (primary) hypertension: Secondary | ICD-10-CM | POA: Diagnosis not present

## 2022-12-28 DIAGNOSIS — S72115A Nondisplaced fracture of greater trochanter of left femur, initial encounter for closed fracture: Secondary | ICD-10-CM | POA: Diagnosis not present

## 2022-12-28 LAB — CBC WITH DIFFERENTIAL/PLATELET
Eosinophils Absolute: 192 cells/uL (ref 15–500)
Eosinophils Relative: 1.7 %
Lymphs Abs: 3074 cells/uL (ref 850–3900)
MCV: 89 fL (ref 80.0–100.0)
Neutro Abs: 7096 cells/uL (ref 1500–7800)
Neutrophils Relative %: 62.8 %
Platelets: 234 10*3/uL (ref 140–400)

## 2022-12-28 NOTE — Telephone Encounter (Signed)
Will Occupational Therapist with [phone] after completing OT evaluation, called in to get additional verbal order for OT for this pt:  Asking for verbal orders for OT of   1x a week for 5 weeks  Please call on secure line at [phone]

## 2022-12-28 NOTE — Progress Notes (Signed)
Subjective:    Patient ID: Eric Montane., male    DOB: 11/11/1927, 89 y.o.   MRN: [id number]  Expand All Collapse All  Physician Discharge Summary  Patient ID: Eric [name]. MRN: [id number] DOB/AGE: 1927-10-01 89 y.o.   Admit date: 12/04/2022 Discharge date: 12/18/2022   Discharge Diagnoses:  Principal Problem:   Greater trochanter fracture [facility]) Active Problems:   Traumatic closed trochanteric fracture of femur with minimal displacement with routine healing, left   Coping style affecting medical condition Hypertension Hyperlipidemia Atrial fibrillation CAD/CABG OSA Anemia BPH Diabetes mellitus AKI/CKD stage III UTI Constipation History of left BKA   Discharged Condition: stable   Significant Diagnostic Studies:   Narrative & Impression  CLINICAL DATA:  Shortness breath   EXAM: PORTABLE CHEST 1 VIEW   COMPARISON:  CXR 09/05/21   FINDINGS: Status post median sternotomy and CABG. Cardiomegaly pleural effusion. No pneumothorax redemonstrated flattening of the bilateral hemidiaphragms. No focal airspace opacity. No radiographically apparent displaced rib fractures. Visualized upper abdomen is unremarkable.   IMPRESSION: 1.  No focal airspace opacity.   2.  Cardiomegaly.     Electronically Signed   By: Lorenza Cambridge M.D.   On: 12/15/2022 12:42    Labs:  Basic Metabolic Panel: Last Labs      Recent Labs  Lab 12/14/22 0435 12/16/22 0502  NA 136 135  K 4.9 4.5  CL 107 108  CO2 19* 21*  GLUCOSE 91 90  BUN 40* 37*  CREATININE 1.49* 1.18  CALCIUM 8.9 8.8*        CBC: Last Labs      Recent Labs  Lab 12/14/22 0435 12/16/22 0502  WBC 11.5* 9.2  NEUTROABS  --  5.6  HGB 9.8* 9.7*  HCT 30.3* 30.1*  MCV 91.5 91.2  PLT 255 254        CBG: Last Labs  No results for input(s): "GLUCAP" in the last 168 hours.       Brief HPI:   Eric Furnish. is a 89 y.o. male presented to the ED on 11/30/2022 complaining of left hip pain.  He evidently got up from a chair, felt left hip pain and lowered himself to the floor. No other injury or LOC. He is s/p left BKA in 2019 and does use a prosthesis.  X-ray of patient's left hip shows positive fracture at the left greater trochanter.  Discussed with Dr. Victorino [name] from orthopedics and he saw the patient consultation.  Admitted to the medicine service. He is on Eliquis for atrial fibrillation. This was initially held for possible surgical intervention. Oral anti-hypotensives continued. Found to have AKI atop CKD stage III. Improved with hydration. PMH significant for chronic anemia, CAD, OSA, PVD, hyperlipidemia. DM-2 diet controlled.  UA positive for GNR and started on Zosyn today. Plan is to convert to Cipro. Follow-up culture results.  Able to stand from recliner with min assist, very little WB through LLE while wearing prosthesis. Min assist with small pivotal steps to bed, requires extra time but gradually putting more pressure through LLE. Good control with descent.     Hospital Course: Eric [name]. was admitted to rehab 12/04/2022 for inpatient therapies to consist of PT, ST and OT at least three hours five days a week. Past admission physiatrist, therapy team and rehab RN have worked together to provide customized collaborative inpatient rehab. Follow-up labs on 4/27: Ns 133, BUN/Cr 1.34/28. Slightly worsening BUN/Cr and improvement in Na on 4/28. Urinary  retention requiring in and out cath 4/28 and Flomax change to q supper. Proscar continues.  Gross hematuria noted. Hgb down from 9.9 to 9.3. Constipation improved. Continued to encourage PO liquids as Cr up slightly to 1.4. WBC continued downward trend>>9.7 on 4/29, back up to 12.9 on 4/30. Now on oral Cipro for UTI. Follow up by [facility] on 5/01 and adjusted left LE prosthesis. Continued to require intermittent caths. Neuropsychology eval on 5/03. Patient and son educated on in and out catheterization. Urecholine added. Instructed to  double void. Still requiring pain medication for left hip pain with therapies.      Blood pressures were monitored on TID basis and  amlodipine 10 mg daily, atenolol 25 mg daily, hydralazine 75 mg BID continued. Atenolol decreased to 12.5 mg daily on 5/05 due to soft BP and mild bradycardia.   Diabetes has been monitored with ac/hs CBG checks and SSI was use prn for tighter BS control. Reduced CBGs to BID. Not requiring coverage therefore SSI discontinued 5/01.     Rehab course: During patient's stay in rehab weekly team conferences were held to monitor patient's progress, set goals and discuss barriers to discharge. At admission, patient required mod assist with mobility,  max with LB basic self-care skills.   Physical exam.  Blood pressure 124/55 pulse 82 temperature 98.6 respirations 18 oxygen saturation is 92% room air Constitutional.  No acute distress HEENT Head.  Normocephalic and atraumatic Eyes.  Pupils round and reactive to light no discharge without nystagmus Neck.  Supple nontender no JVD without thyromegaly Cardiac regular rate and rhythm without any extra sounds or murmur heard Abdomen.  Soft nontender positive bowel sounds without rebound Respiratory effort normal no respiratory distress without wheeze Neurologic.  Alert oriented x 3 normal insight and awareness       He has had improvement in activity tolerance, balance, postural control as well as ability to compensate for deficits. He has had improvement in functional use RUE/LUE  and RLE/LLE as well as improvement in awareness. Walked 50 feet X 2 with PT on 5/06. Walked 153 feet on 5/08. He will have home health through [facility] for PT, OT and RN.           Disposition: Discharge to home   Patient states that he is no longer having to catheterize himself.  He is going to the bathroom and urinating every 2-3 hours.  He reports a weak stream.  He denies any dysuria.  He denies any fevers or chills.  There is no bladder  distention on exam.  He is soft and nontender in his lower abdomen upon palpation.  He denies any hematuria.  He is still taking bethanechol, finasteride, Flomax.  He denies any side effects on the medication.  He has 4 children who are alternating staying with him.  Therefore he is never by himself.  He is ambulating around his home with a walker.  He does have some pain in his right wrist at the [facility] joint however he had an x-ray today that showed arthritis but no fracture.  He is wearing a wrist splint for this.  He denies any chest pain, shortness of breath, dyspnea on exertion.  He denies any pain in his right leg to suggest DVT.  He has negative Homans' sign.  He is having some problems with constipation.  He is taking a pain pill once a day perhaps may be less.  He is also using Tylenol to help manage the pain.  This is sufficient most of the time Past Medical History:  Diagnosis Date   Atrial flutter (HCC)    s/p RFCA   CAD (coronary artery disease)    cath 2003, occluded S-RCA, occluded S-Dx, L-LAD ok, s/p PTCA to LAD   Cataract    CKD (chronic kidney disease) stage 3, GFR 30-59 ml/min (HCC)    Diabetes mellitus    diet controlled   Hearing aid worn    bilateral   History of kidney stones    Long term (current) use of anticoagulants    Neuromuscular disorder (HCC)    OSA (obstructive sleep apnea) 12/11   very mild, AHI 7/hr   Persistent atrial fibrillation (HCC) 09/26/2015   Pure hyperglyceridemia    PVD (peripheral vascular disease) (HCC)    angioplasty of his right lower extremity in [place] by Dr.[name] 2013   Unspecified essential hypertension    Wears dentures    full upper and lower   Past Surgical History:  Procedure Laterality Date   ABDOMINAL AORTOGRAM W/LOWER EXTREMITY N/A 11/15/2017   Procedure: ABDOMINAL AORTOGRAM W/LOWER EXTREMITY;  Surgeon: Sherren Kerns, MD;  [facility]: [facility] INV[facility]IVE CV LAB;  Service: Cardiovascular;  Laterality: N/A;   Adenosine Myoview  3/06   EF  56%, neg. Ischemia   Adenosine Myoview  02/18/07   nml   AMPUTATION Left 12/15/2017   Procedure: AMPUTATION BELOW KNEE;  Surgeon: Annice Needy, MD;  Location: [facility] ORS;  Service: Vascular;  Laterality: Left;   ANGIOPLASTY  1/99   CAD- diogonal with rotational artherectomy   Arthrectomy     of LAD & PTCA   BLEPHAROPLASTY Bilateral    CARDIAC CATHETERIZATION  1/00   CARDIOVERSION  1/04   CARDIOVERSION  5/07   hospital- a flutter   CARPAL TUNNEL RELEASE     ? bilateral   CATARACT EXTRACTION W/PHACO Right 07/28/2017   Procedure: CATARACT EXTRACTION PHACO AND INTRAOCULAR LENS PLACEMENT (IOC) RIGHT DIABETIC;  Surgeon: Lockie Mola, MD;  Location: [facility];  Service: Ophthalmology;  Laterality: Right;  Diabetic - diet controlled   CATARACT EXTRACTION W/PHACO Left 08/18/2017   Procedure: CATARACT EXTRACTION PHACO AND INTRAOCULAR LENS PLACEMENT (IOC);  Surgeon: Lockie Mola, MD;  Location: [facility];  Service: Ophthalmology;  Laterality: Left;  DIABETES - oral meds   COLONOSCOPY W/ BIOPSIES  10/01/06   sigmoid polyp bx neg, 3 years   COLONOSCOPY WITH PROPOFOL N/A 04/28/2021   Procedure: COLONOSCOPY WITH PROPOFOL;  Surgeon: Iva Boop, MD;  Location: [facility] ENDOSCOPY;  Service: Endoscopy;  Laterality: N/A;   CORONARY ANGIOPLASTY  4/03   cutting balloon PTCA pLAD into Diag   CORONARY ARTERY BYPASS GRAFT  2000   LIMA-LAD, SVG-RCA, SVG-Diag; SVG-Diag & SVG-RCA occluded 2003   FRACTURE SURGERY     HAND SURGERY  08/27/09   R thumb procedure wit Scaphoid Gragt and screws, Dr Merlyn Lot   LOWER EXTREMITY ANGIOGRAPHY Right 01/25/2019   Procedure: LOWER EXTREMITY ANGIOGRAPHY;  Surgeon: Renford Dills, MD;  Location: [facility] INVASIVE CV LAB;  Service: Cardiovascular;  Laterality: Right;   NM MYOVIEW LTD  4/11   normal   PERIPHERAL VASCULAR CATHETERIZATION Right 06/27/2015   Procedure: Lower Extremity Angiography;  Surgeon: Annice Needy, MD;  Location: [facility] INVASIVE CV LAB;   Service: Cardiovascular;  Laterality: Right;   PERIPHERAL VASCULAR CATHETERIZATION  06/27/2015   Procedure: Lower Extremity Intervention;  Surgeon: Annice Needy, MD;  Location: [facility] INVASIVE CV LAB;  Service: Cardiovascular;;   POLYPECTOMY  04/28/2021   Procedure: POLYPECTOMY;  Surgeon: Iva Boop, MD;  Location: [facility] ENDOSCOPY;  Service: Endoscopy;;   Current Outpatient Medications on File Prior to Visit  Medication Sig Dispense Refill   acetaminophen (TYLENOL) 325 MG tablet Take 2 tablets (650 mg total) by mouth every 6 (six) hours as needed for mild pain (or temp >/= 101 F).     amLODipine (NORVASC) 10 MG tablet Take 1 tablet (10 mg total) by mouth daily. 90 tablet 1   apixaban (ELIQUIS) 2.5 MG TABS tablet Take 1 tablet (2.5 mg total) by mouth 2 (two) times daily. OFFICE VISIT NEEDED FOR ADDITIONAL REFILLS 180 tablet 1   atenolol (TENORMIN) 25 MG tablet Take 1/2 tablet (12.5 mg total) by mouth daily. 15 tablet 0   bethanechol (URECHOLINE) 10 MG tablet Take 0.5 tablets (5 mg total) by mouth 3 (three) times daily. 45 tablet 0   Cinnamon 500 MG TABS Take 500 mg by mouth daily.     Cranberry 500 MG CAPS Take 500 mg by mouth daily.     cyanocobalamin (VITAMIN B12) 500 MCG tablet Take 500 mcg by mouth daily.     ferrous sulfate 325 (65 FE) MG EC tablet Take 325 mg by mouth at bedtime.     finasteride (PROSCAR) 5 MG tablet TAKE 1 TABLET BY MOUTH ONCE DAILY (Patient taking differently: Take 5 mg by mouth daily.) 90 tablet 3   fluticasone (FLONASE) 50 MCG/ACT nasal spray Place 1 spray into both nostrils daily.  2   hydrALAZINE (APRESOLINE) 25 MG tablet Take 3 tablets (75 mg total) by mouth in the morning and at bedtime. 180 tablet 3   Multiple Vitamin (DAILY MULTIVITAMIN PO) Take 1 tablet by mouth daily.     ONETOUCH ULTRA test strip USE 1 STRIP TO CHECK GLUCOSE ONCE DAILY 50 each 0   pentoxifylline (TRENTAL) 400 MG CR tablet TAKE 1 TABLET BY MOUTH EVERY 12 HOURS . (Patient taking differently: Take  400 mg by mouth every 12 (twelve) hours. TAKE 1 TABLET BY MOUTH EVERY 12 HOURS .) 180 tablet 1   rosuvastatin (CRESTOR) 40 MG tablet Take 1 tablet (40 mg total) by mouth daily. 90 tablet 1   tamsulosin (FLOMAX) 0.4 MG CAPS capsule Take 2 capsules (0.8 mg total) by mouth daily after supper. 60 capsule 0   temazepam (RESTORIL) 7.5 MG capsule Take 1 capsule (7.5 mg total) by mouth at bedtime as needed for sleep. 15 capsule 0   No current facility-administered medications on file prior to visit.   Allergies  Allergen Reactions   Altace [Ramipril] Cough   Isosorbide Mononitrate Other (See Comments)    Unknown reaction   Quinidine Gluconate Rash   Social History   Socioeconomic History   Marital status: Married    Spouse name: [name]   Number of children: 5   Years of education: Not on file   Highest education level: Not on file  Occupational History   Occupation: [employer]    Employer: RETIRED  Tobacco Use   Smoking status: Never   Smokeless tobacco: Former    Quit date: 1994  Building services engineer Use: Never used  Substance and Sexual Activity   Alcohol use: No   Drug use: No   Sexual activity: Never  Other Topics Concern   Not on file  Social History Narrative   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   5 kids  11 grandchildren.   20 great grandchilren.   Social Determinants of Health   Financial Resource Strain: Low Risk  (08/12/2022)   Overall Financial Resource Strain (CARDIA)    Difficulty of Paying Living Expenses: Not hard at all  Food Insecurity: No Food Insecurity (12/01/2022)   Hunger Vital Sign    Worried About Running Out of Food in the Last Year: Never true    Ran Out of Food in the Last Year: Never true  Transportation Needs: No Transportation Needs (12/01/2022)   PRAPARE - Administrator, Civil Service (Medical): No    Lack of Transportation (Non-Medical): No  Physical Activity:  Insufficiently Active (08/12/2022)   Exercise Vital Sign    Days of Exercise per Week: 5 days    Minutes of Exercise per Session: 20 min  Stress: No Stress Concern Present (08/12/2022)   [employer] of Occupational Health - Occupational Stress Questionnaire    Feeling of Stress : Not at all  Social Connections: Socially Integrated (08/12/2022)   Social Connection and Isolation Panel [NHANES]    Frequency of Communication with Friends and Family: More than three times a week    Frequency of Social Gatherings with Friends and Family: More than three times a week    Attends Religious Services: More than 4 times per year    Active Member of [employer] or Organizations: Yes    Attends Banker Meetings: More than 4 times per year    Marital Status: Married  Catering manager Violence: Not At Risk (12/01/2022)   Humiliation, Afraid, Rape, and Kick questionnaire    Fear of Current or Ex-Partner: No    Emotionally Abused: No    Physically Abused: No    Sexually Abused: No      Review of Systems  All other systems reviewed and are negative.      Objective:   Physical Exam Vitals reviewed.  Constitutional:      Appearance: Normal appearance.  Cardiovascular:     Rate and Rhythm: Normal rate. Rhythm irregular.     Pulses: Normal pulses.     Heart sounds: Normal heart sounds.  Pulmonary:     Effort: Pulmonary effort is normal.     Breath sounds: Normal breath sounds. No wheezing, rhonchi or rales.  Abdominal:     General: Abdomen is flat.     Palpations: Abdomen is soft.  Musculoskeletal:     Left hip: Tenderness present. No bony tenderness. Decreased range of motion.     Right lower leg: Edema present.       Legs:  Neurological:     Mental Status: He is alert.   Patient is sitting in a wheelchair.  He has a left below the knee amputation.  There is a hematoma over his lateral left thigh just above the prosthesis.  The lateral thigh is firm in that area suggesting a  hematoma or tenderness or evidence of compartment syndrome.  There is no erythema or warmth to suggest an abscess.  Patient states the swelling in that area has improved        Assessment & Plan:  Nondisplaced fracture of greater trochanter of left femur, subsequent encounter for closed fracture with routine healing - Plan: CBC with Differential/Platelet, COMPLETE METABOLIC PANEL WITH GFR  Chronic atrial fibrillation (HCC)  Urinary retention  Anemia, unspecified type With regards to his anemia, I will check a CBC.  As long as hemoglobin is trending upward, no further workup is required.  I do not feel that there is any active bleeding in his left thigh.  He is no longer seeing any hematuria.  With regards to his urinary retention, continue bethanechol, Flomax, and finasteride.  With patient's urine output and urine flow improved, may try to wean off bethanechol.  Pain is being managed primarily with Tylenol with occasional hydrocodone for breakthrough.  Physical therapy is working with the patient.  He is walking with a walker using his prosthesis.  His family is staying with them.  He has a shower chair with a renovated shower, toilet lift, and grab bars in his home.  Therefore I believe that his home environment is optimized for the situation.

## 2022-12-29 LAB — COMPLETE METABOLIC PANEL WITH GFR
AG Ratio: 1.1 (calc) (ref 1.0–2.5)
ALT: 20 U/L (ref 9–46)
AST: 29 U/L (ref 10–35)
Albumin: 3.2 g/dL — ABNORMAL LOW (ref 3.6–5.1)
Alkaline phosphatase (APISO): 99 U/L (ref 35–144)
BUN/Creatinine Ratio: 24 (calc) — ABNORMAL HIGH (ref 6–22)
BUN: 30 mg/dL — ABNORMAL HIGH (ref 7–25)
CO2: 21 mmol/L (ref 20–32)
Calcium: 8.8 mg/dL (ref 8.6–10.3)
Chloride: 106 mmol/L (ref 98–110)
Creat: 1.23 mg/dL — ABNORMAL HIGH (ref 0.70–1.22)
Globulin: 3 g/dL (calc) (ref 1.9–3.7)
Glucose, Bld: 102 mg/dL — ABNORMAL HIGH (ref 65–99)
Potassium: 5.3 mmol/L (ref 3.5–5.3)
Sodium: 135 mmol/L (ref 135–146)
Total Bilirubin: 0.4 mg/dL (ref 0.2–1.2)
Total Protein: 6.2 g/dL (ref 6.1–8.1)
eGFR: 55 mL/min/{1.73_m2} — ABNORMAL LOW (ref >=60)

## 2022-12-29 LAB — CBC WITH DIFFERENTIAL/PLATELET
Absolute Monocytes: 881 cells/uL (ref 200–950)
Basophils Absolute: 57 cells/uL (ref 0–200)
Basophils Relative: 0.5 %
HCT: 31.6 % — ABNORMAL LOW (ref 38.5–50.0)
Hemoglobin: 10.3 g/dL — ABNORMAL LOW (ref 13.2–17.1)
MCH: 29 pg (ref 27.0–33.0)
MCHC: 32.6 g/dL (ref 32.0–36.0)
MPV: 10 fL (ref 7.5–12.5)
Monocytes Relative: 7.8 %
RBC: 3.55 10*6/uL — ABNORMAL LOW (ref 4.20–5.80)
RDW: 14.5 % (ref 11.0–15.0)
Total Lymphocyte: 27.2 %
WBC: 11.3 10*3/uL — ABNORMAL HIGH (ref 3.8–10.8)

## 2023-01-05 ENCOUNTER — Other Ambulatory Visit: Payer: Self-pay

## 2023-01-05 ENCOUNTER — Telehealth: Payer: Self-pay

## 2023-01-05 DIAGNOSIS — R339 Retention of urine, unspecified: Secondary | ICD-10-CM

## 2023-01-05 MED ORDER — BETHANECHOL CHLORIDE 10 MG PO TABS
5.0000 mg | ORAL_TABLET | Freq: Three times a day (TID) | ORAL | 1 refills | Status: DC
Start: 2023-01-05 — End: 2023-03-10

## 2023-01-05 NOTE — Telephone Encounter (Signed)
Prescription Request  01/05/2023  LOV: 12/28/22  What is the name of the medication or equipment? Rx #: [id number]  bethanechol (URECHOLINE) 10 MG tablet [[id number]]   Have you contacted your pharmacy to request a refill? No   Which pharmacy would you like this sent to?  [facility] [address], [place] - [address] [place] [address] Phone: [phone]  Fax: [phone] DEA #: --    Patient notified that their request is being sent to the clinical staff for review and that they should receive a response within 2 business days.   Please advise at [facility] [phone]

## 2023-02-03 DIAGNOSIS — S72115A Nondisplaced fracture of greater trochanter of left femur, initial encounter for closed fracture: Secondary | ICD-10-CM | POA: Diagnosis not present

## 2023-02-03 DIAGNOSIS — Z89512 Acquired absence of left leg below knee: Secondary | ICD-10-CM | POA: Diagnosis not present

## 2023-02-08 ENCOUNTER — Other Ambulatory Visit: Payer: Self-pay | Admitting: Family Medicine

## 2023-02-08 NOTE — Telephone Encounter (Signed)
Prescription Request  02/08/2023  LOV: 12/28/2022  What is the name of the medication or equipment?   tamsulosin (FLOMAX) 0.4 MG CAPS capsule  **Dose doubled; pharmacy needs a new script**  Patient prescribed atenolol (TENORMIN) 25 MG tablet [[id number]]  in the hospital. If provider wants him to continue taking it, new script needed.   Have you contacted your pharmacy to request a refill? Yes   Which pharmacy would you like this sent to?    [facility] [address] [place] - [address] [place] [address] Phone: [phone]  Fax: [phone]   Patient notified that their request is being sent to the clinical staff for review and that they should receive a response within 2 business days.   Please advise patient's daughter Clydie Braun when refill sent in at [phone].

## 2023-02-09 NOTE — Telephone Encounter (Signed)
Requested medication (s) are due for refill today: yes  Requested medication (s) are on the active medication list: yes  Last refill:  both 12/17/22   Future visit scheduled: no  Notes to clinic:  the last provider was at HOSPITAL DISCHARGE- please forward to provider.    Requested Prescriptions  Pending Prescriptions Disp Refills   atenolol (TENORMIN) 25 MG tablet 15 tablet 0    Sig: Take 1/2 tablet (12.5 mg total) by mouth daily.     Cardiovascular: Beta Blockers 2 Failed - 02/08/2023  5:58 PM      Failed - Cr in normal range and within 360 days    Creat  Date Value Ref Range Status  12/28/2022 1.23 (H) 0.70 - 1.22 mg/dL Final   Creatinine,U  Date Value Ref Range Status  02/19/2011 48.6 mg/dL Final   Creatinine, Urine  Date Value Ref Range Status  08/13/2016 62 20 - 370 mg/dL Final         Failed - Valid encounter within last 6 months    Recent Outpatient Visits           1 year ago Wheezing   [facility] [name], NP   1 year ago Immunization due   [facility] Pickard, Priscille Heidelberg, MD   1 year ago Diverticulosis   [facility] [name], MD   2 years ago Type 2 diabetes mellitus with diabetic peripheral angiopathy with gangrene, unspecified whether long term insulin use (HCC)   [facility] Pickard, Priscille Heidelberg, MD   2 years ago Hematuria, unspecified type   [facility] [name], NP       Future Appointments             In 3 days Lovorn, Aundra Millet, MD [facility] Physical Medicine & Rehabilitation, CPR   In 1 month Pricilla Riffle, MD [facility]            Passed - Last BP in normal range    BP Readings from Last 1 Encounters:  12/28/22 122/62         Passed - Last Heart Rate in normal range    Pulse Readings from Last 1 Encounters:  12/28/22 (!) 57          tamsulosin (FLOMAX) 0.4 MG CAPS capsule 60 capsule  0    Sig: Take 2 capsules (0.8 mg total) by mouth daily after supper.     Urology: Alpha-Adrenergic Blocker Failed - 02/08/2023  5:58 PM      Failed - PSA in normal range and within 360 days    PSA  Date Value Ref Range Status  12/16/2015 0.70 <=4.00 ng/mL Final    Comment:    Test Methodology: ECLIA PSA (Electrochemiluminescence Immunoassay)   For PSA values from 2.5-4.0, particularly in younger men <17 years old, the AUA and NCCN suggest testing for % Free PSA (3515) and evaluation of the rate of increase in PSA (PSA velocity).          Failed - Valid encounter within last 12 months    Recent Outpatient Visits           1 year ago Wheezing   [facility] [name], NP   1 year ago Immunization due   [facility] Pickard, Priscille Heidelberg, MD   1 year ago Diverticulosis   [facility] [name], MD   2 years ago Type 2 diabetes mellitus with diabetic peripheral angiopathy with gangrene, unspecified whether long term insulin use (HCC)   [facility] Tanya Nones, Priscille Heidelberg, MD   2 years ago Hematuria, unspecified type   [facility] [name], NP       Future Appointments             In 3 days Lovorn, Aundra Millet, MD [facility] Physical Medicine & Rehabilitation, CPR   In 1 month Pricilla Riffle, MD [facility] at [facility]            Passed - Last BP in normal range    BP Readings from Last 1 Encounters:  12/28/22 122/62

## 2023-02-12 ENCOUNTER — Encounter: Payer: PPO | Attending: Physical Medicine and Rehabilitation | Admitting: Physical Medicine and Rehabilitation

## 2023-02-12 ENCOUNTER — Encounter: Payer: Self-pay | Admitting: Physical Medicine and Rehabilitation

## 2023-02-12 VITALS — BP 173/70 | HR 60 | Ht 65.0 in | Wt 166.2 lb

## 2023-02-12 DIAGNOSIS — R269 Unspecified abnormalities of gait and mobility: Secondary | ICD-10-CM | POA: Diagnosis not present

## 2023-02-12 DIAGNOSIS — S72142D Displaced intertrochanteric fracture of left femur, subsequent encounter for closed fracture with routine healing: Secondary | ICD-10-CM | POA: Diagnosis not present

## 2023-02-12 DIAGNOSIS — Z89512 Acquired absence of left leg below knee: Secondary | ICD-10-CM

## 2023-02-12 NOTE — Patient Instructions (Addendum)
Pt is a 89 yr old male with prior L BKA and L great trochanteric fx- also has: BPH causing urinary retention, HTN; Afib; CAD; OSA; anemia- DM2- A1c 4.8; CKD stage III; PAD s/p L BKA;  Here for hospital f/u on Fx and old L BKA.    Call me if you want me to order more therapy. Since insurance issues/codes going problems going on.   2. Has appointment with Urology 7/17 about urinary retention.    3. Doesn't need pain meds. Stopped taking weeks ago- and not even taking tylenol.    4. F/U as needed- only if needs me- for anything related to prosthesis or therapy.

## 2023-02-12 NOTE — Progress Notes (Signed)
Subjective:    Patient ID: Eric [name]., male    DOB: 07/22/1928, 89 y.o.   MRN: [id number]  HPI  Pt is a 89 yr old male with prior L BKA and L great trochanteric fx- also has: BPH causing urinary retention, HTN; Afib; CAD; OSA; anemia- DM2- A1c 4.8; CKD stage III; PAD s/p L BKA;  Here for hospital f/u on Fx and old L BKA.   Off RW when he "wants to be"-  Sometimes takes RW- but sometimes using cool single point cane.   If went to grocery store, would use RW-  And actually gets scooter;   Has appt 7/16 for Prosthesis- [facility]-  Wore his current BKA prosthesis out- needs to be replaced q 5 years.   Needs top to be made smaller so can get britches up easier.  Has Rx for prosthesis- got from Ortho surgeon-  [facility].    Not taking pain meds for weeks.  Took 2 of them, then took only tylenol- and then has stopped that as well.   Urinary retention- still a slow stream-  has appointment with Urology- July 17th- 2024-[facility]-  Put on Flomax and Urecholine- no in/out caths since went home.   They stopped H/H this week- doesn't think needs to go to Outpt PT per pt Might need to  work on, walking more- wants to do better with steadiness.   Everything has been put in, has been turned down.   UD 402- the code that son mentioned was the code saying "insurance won't be approved"- for acute rehab and acute stay at [facility]- the way insurance turned down  hospital stay- the whole hospital stay and rehab stay was not paid for!      Pain Inventory Average Pain 0 Pain Right Now 0 My pain is no pain  In the last 24 hours, has pain interfered with the following? General activity 0 Relation with others 0 Enjoyment of life 0 What TIME of day is your pain at its worst?no pain Sleep (in general) Fair  Pain is worse with:  no pain Pain improves with:  no pain Relief from Meds:  no pain  walk without assistance walk with assistance use a cane use a walker ability to  climb steps?  no do you drive?  no use a wheelchair transfers alone  retired  bladder control problems trouble walking  Any changes since last visit?  no  Any changes since last visit?  no    Family History  Problem Relation Age of Onset   Stroke Father    Aneurysm Father    Hypertension Father    Prostate cancer Brother    Hypertension Mother    Lung cancer Brother        smoker   Thrombosis Brother    Other Brother        RF valve disorder (smoker)   Lung cancer Brother        smoker   Breast cancer Sister    Liver cancer Brother    Other Sister        cerebral hemorrhage   Social History   Socioeconomic History   Marital status: Married    Spouse name: [name]   Number of children: 5   Years of education: Not on file   Highest education level: Not on file  Occupational History   Occupation: [employer]    Employer: RETIRED  Tobacco Use   Smoking status: Never  Smokeless tobacco: Former    Quit date: 1994  Building services engineer Use: Never used  Substance and Sexual Activity   Alcohol use: No   Drug use: No   Sexual activity: Never  Other Topics Concern   Not on file  Social History Narrative   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   5 kids   11 grandchildren.   20 great grandchilren.   Social Determinants of Health   Financial Resource Strain: Low Risk  (08/12/2022)   Overall Financial Resource Strain (CARDIA)    Difficulty of Paying Living Expenses: Not hard at all  Food Insecurity: No Food Insecurity (12/01/2022)   Hunger Vital Sign    Worried About Running Out of Food in the Last Year: Never true    Ran Out of Food in the Last Year: Never true  Transportation Needs: No Transportation Needs (12/01/2022)   PRAPARE - Administrator, Civil Service (Medical): No    Lack of Transportation (Non-Medical): No  Physical Activity: Insufficiently Active (08/12/2022)   Exercise Vital Sign     Days of Exercise per Week: 5 days    Minutes of Exercise per Session: 20 min  Stress: No Stress Concern Present (08/12/2022)   [employer] of Occupational Health - Occupational Stress Questionnaire    Feeling of Stress : Not at all  Social Connections: Socially Integrated (08/12/2022)   Social Connection and Isolation Panel [NHANES]    Frequency of Communication with Friends and Family: More than three times a week    Frequency of Social Gatherings with Friends and Family: More than three times a week    Attends Religious Services: More than 4 times per year    Active Member of Clubs or Organizations: Yes    Attends Banker Meetings: More than 4 times per year    Marital Status: Married   Past Surgical History:  Procedure Laterality Date   ABDOMINAL AORTOGRAM W/LOWER EXTREMITY N/A 11/15/2017   Procedure: ABDOMINAL AORTOGRAM W/LOWER EXTREMITY;  Surgeon: Sherren Kerns, MD;  Location: [facility];  Service: Cardiovascular;  Laterality: N/A;   Adenosine Myoview  3/06   EF 56%, neg. Ischemia   Adenosine Myoview  02/18/07   nml   AMPUTATION Left 12/15/2017   Procedure: AMPUTATION BELOW KNEE;  Surgeon: Annice Needy, MD;  Location: [facility] OR[facility];  Service: Vascular;  Laterality: Left;   ANGIOPLASTY  1/99   CAD- diogonal with rotational artherectomy   Arthrectomy     of LAD & PTCA   BLEPHAROPLASTY Bilateral    CARDIAC CATHETERIZATION  1/00   CARDIOVERSION  1/04   CARDIOVERSION  5/07   hospital- a flutter   CARPAL TUNNEL RELEASE     ? bilateral   CATARACT EXTRACTION W/PHACO Right 07/28/2017   Procedure: CATARACT EXTRACTION PHACO AND INTRAOCULAR LENS PLACEMENT (IOC) RIGHT DIABETIC;  Surgeon: Lockie Mola, MD;  Location: [facility];  Service: Ophthalmology;  Laterality: Right;  Diabetic - diet controlled   CATARACT EXTRACTION W/PHACO Left 08/18/2017   Procedure: CATARACT EXTRACTION PHACO AND INTRAOCULAR LENS PLACEMENT (IOC);  Surgeon: Lockie Mola,  MD;  Location: [facility];  Service: Ophthalmology;  Laterality: Left;  DIABETES - oral meds   COLONOSCOPY W/ BIOPSIES  10/01/06   sigmoid polyp bx neg, 3 years   COLONOSCOPY WITH PROPOFOL N/A 04/28/2021   Procedure: COLONOSCOPY WITH PROPOFOL;  Surgeon: Iva Boop, MD;  Location:  [facility];  Service: Endoscopy;  Laterality: N/A;   CORONARY ANGIOPLASTY  4/03   cutting balloon PTCA pLAD into Diag   CORONARY ARTERY BYPASS GRAFT  2000   LIMA-LAD, SVG-RCA, SVG-Diag; SVG-Diag & SVG-RCA occluded 2003   FRACTURE SURGERY     HAND SURGERY  08/27/09   R thumb procedure wit Scaphoid Gragt and screws, Dr Merlyn Lot   LOWER EXTREMITY ANGIOGRAPHY Right 01/25/2019   Procedure: LOWER EXTREMITY ANGIOGRAPHY;  Surgeon: Renford Dills, MD;  Location: [facility];  Service: Cardiovascular;  Laterality: Right;   NM MYOVIEW LTD  4/11   normal   PERIPHERAL VASCULAR CATHETERIZATION Right 06/27/2015   Procedure: Lower Extremity Angiography;  Surgeon: Annice Needy, MD;  Location: [facility];  Service: Cardiovascular;  Laterality: Right;   PERIPHERAL VASCULAR CATHETERIZATION  06/27/2015   Procedure: Lower Extremity Intervention;  Surgeon: Annice Needy, MD;  Location: [facility];  Service: Cardiovascular;;   POLYPECTOMY  04/28/2021   Procedure: POLYPECTOMY;  Surgeon: Iva Boop, MD;  Location: [facility];  Service: Endoscopy;;   Past Medical History:  Diagnosis Date   Atrial flutter (HCC)    s/p RFCA   CAD (coronary artery disease)    cath 2003, occluded S-RCA, occluded S-Dx, L-LAD ok, s/p PTCA to LAD   Cataract    CKD (chronic kidney disease) stage 3, GFR 30-59 ml/min (HCC)    Diabetes mellitus    diet controlled   Hearing aid worn    bilateral   History of kidney stones    Long term (current) use of anticoagulants    Neuromuscular disorder (HCC)    OSA (obstructive sleep apnea) 12/11   very mild, AHI 7/hr   Persistent atrial fibrillation (HCC) 09/26/2015    Pure hyperglyceridemia    PVD (peripheral vascular disease) (HCC)    angioplasty of his right lower extremity in [place] by Dr.[name] 2013   Unspecified essential hypertension    Wears dentures    full upper and lower   BP (!) 173/70   Pulse 60   Ht 5\' 5"  (1.651 m)   Wt 166 lb 3.2 oz (75.4 kg)   SpO2 97%   BMI 27.66 kg/m   Opioid Risk Score:   Fall Risk Score:  `1  Depression screen PHQ 2/9     02/12/2023    1:05 PM 08/12/2022    3:31 PM 07/24/2021    9:58 AM 05/20/2021   12:13 PM 05/08/2021   11:18 AM 12/31/2020    8:13 AM 07/24/2020    9:26 AM  Depression screen PHQ 2/9  Decreased Interest 0 0 0 0 0 0 0  Down, Depressed, Hopeless 0 0 0 0 0 0 0  PHQ - 2 Score 0 0 0 0 0 0 0  Altered sleeping 1        Tired, decreased energy 0        Change in appetite 0        Feeling bad or failure about yourself  0        Trouble concentrating 0        Moving slowly or fidgety/restless 0        Suicidal thoughts 0        PHQ-9 Score 1            Review of Systems  Constitutional: Negative.   HENT: Negative.    Eyes: Negative.   Respiratory: Negative.    Cardiovascular: Negative.   Gastrointestinal:  Negative.   Endocrine: Negative.   Genitourinary:        Bladder control  Musculoskeletal:  Positive for gait problem.  Skin: Negative.   Allergic/Immunologic: Negative.   Hematological:  Adenopathy: eliquis. Bruises/bleeds easily.  Psychiatric/Behavioral: Negative.    All other systems reviewed and are negative.      Objective:   Physical Exam  Awake, alert, appropriate, accompanied by son; using cool single point cane; doesn't have RW today; NAD L BKA prosthesis in place  RLE 3-4+ LE edema to mid calf MS; Ue's 5-/5 B/L RLE 5-/5 in HF/KE/KF/DF and PF LLE- HF 4+/5; KE/KF 4+/5-  Has L BKA- wearing prosthesis.         Assessment & Plan:   Pt is a 89 yr old male with prior L BKA and L great trochanteric fx- also has: BPH causing urinary retention, HTN; Afib; CAD;  OSA; anemia- DM2- A1c 4.8; CKD stage III; PAD s/p L BKA;  Here for hospital f/u on Fx and old L BKA.    Call me if you want me to order more therapy. Since insurance issues/codes going problems going on.   2. Has appointment with Urology 7/17 about urinary retention.    3. Doesn't need pain meds. Stopped taking weeks ago- and not even taking tylenol.    4. F/U as needed- only if needs me   I spent a total of  24  minutes on total care today- >50% coordination of care- due to d/w pt about getting new prosthesis- and needing more therapy- also d/w pt about urinary retention.

## 2023-02-15 ENCOUNTER — Other Ambulatory Visit: Payer: Self-pay | Admitting: Family Medicine

## 2023-02-15 ENCOUNTER — Other Ambulatory Visit: Payer: Self-pay | Admitting: Internal Medicine

## 2023-02-15 NOTE — Telephone Encounter (Signed)
Requested medications are due for refill today.  yes  Requested medications are on the active medications list.  yes  Last refill. 12/17/2022 #15 0 rf  Future visit scheduled.   no  Notes to clinic.  It does appear that this pt sees Dr. Tanya Nones. Please review for refill.    Requested Prescriptions  Pending Prescriptions Disp Refills   atenolol (TENORMIN) 25 MG tablet [Pharmacy Med Name: Atenolol 25 MG Oral Tablet] 90 tablet 0    Sig: Take 1 tablet by mouth once daily     Cardiovascular: Beta Blockers 2 Failed - 02/15/2023 10:51 AM      Failed - Cr in normal range and within 360 days    Creat  Date Value Ref Range Status  12/28/2022 1.23 (H) 0.70 - 1.22 mg/dL Final   Creatinine,U  Date Value Ref Range Status  02/19/2011 48.6 mg/dL Final   Creatinine, Urine  Date Value Ref Range Status  08/13/2016 62 20 - 370 mg/dL Final         Failed - Last BP in normal range    BP Readings from Last 1 Encounters:  02/12/23 (!) 173/70         Failed - Valid encounter within last 6 months    Recent Outpatient Visits           1 year ago Wheezing   [facility] [name], NP   1 year ago Immunization due   [facility] Pickard, Priscille Heidelberg, MD   1 year ago Diverticulosis   [facility] [name], MD   2 years ago Type 2 diabetes mellitus with diabetic peripheral angiopathy with gangrene, unspecified whether long term insulin use (HCC)   [facility] [name], MD   2 years ago Hematuria, unspecified type   [facility] [name], NP       Future Appointments             In 1 month Pricilla Riffle, MD [facility]            Passed - Last Heart Rate in normal range    Pulse Readings from Last 1 Encounters:  02/12/23 60

## 2023-02-16 DIAGNOSIS — E119 Type 2 diabetes mellitus without complications: Secondary | ICD-10-CM | POA: Diagnosis not present

## 2023-02-16 DIAGNOSIS — H35372 Puckering of macula, left eye: Secondary | ICD-10-CM | POA: Diagnosis not present

## 2023-02-16 DIAGNOSIS — H35712 Central serous chorioretinopathy, left eye: Secondary | ICD-10-CM | POA: Diagnosis not present

## 2023-02-16 DIAGNOSIS — Z961 Presence of intraocular lens: Secondary | ICD-10-CM | POA: Diagnosis not present

## 2023-02-16 LAB — HM DIABETES EYE EXAM

## 2023-02-17 ENCOUNTER — Other Ambulatory Visit: Payer: Self-pay

## 2023-02-17 ENCOUNTER — Telehealth: Payer: Self-pay | Admitting: Family Medicine

## 2023-02-17 DIAGNOSIS — I10 Essential (primary) hypertension: Secondary | ICD-10-CM

## 2023-02-17 DIAGNOSIS — R339 Retention of urine, unspecified: Secondary | ICD-10-CM

## 2023-02-17 MED ORDER — TAMSULOSIN HCL 0.4 MG PO CAPS
0.8000 mg | ORAL_CAPSULE | Freq: Every day | ORAL | 3 refills | Status: DC
Start: 2023-02-17 — End: 2023-06-15

## 2023-02-17 MED ORDER — ATENOLOL 25 MG PO TABS
12.5000 mg | ORAL_TABLET | Freq: Every day | ORAL | 3 refills | Status: DC
Start: 2023-02-17 — End: 2023-06-15

## 2023-02-17 NOTE — Telephone Encounter (Signed)
Patients son called back states that they need a new prescription for flomax 0.4mg  [facility] 2 capsules a [facility] of on3 and he also needs atenelol.  CB# [phone]

## 2023-02-24 DIAGNOSIS — N3020 Other chronic cystitis without hematuria: Secondary | ICD-10-CM | POA: Diagnosis not present

## 2023-02-24 DIAGNOSIS — N401 Enlarged prostate with lower urinary tract symptoms: Secondary | ICD-10-CM | POA: Diagnosis not present

## 2023-02-24 DIAGNOSIS — R3914 Feeling of incomplete bladder emptying: Secondary | ICD-10-CM | POA: Diagnosis not present

## 2023-02-24 DIAGNOSIS — N3021 Other chronic cystitis with hematuria: Secondary | ICD-10-CM | POA: Diagnosis not present

## 2023-02-24 DIAGNOSIS — R3912 Poor urinary stream: Secondary | ICD-10-CM | POA: Diagnosis not present

## 2023-03-01 DIAGNOSIS — H353221 Exudative age-related macular degeneration, left eye, with active choroidal neovascularization: Secondary | ICD-10-CM | POA: Diagnosis not present

## 2023-03-01 DIAGNOSIS — H35712 Central serous chorioretinopathy, left eye: Secondary | ICD-10-CM | POA: Diagnosis not present

## 2023-03-01 DIAGNOSIS — Z961 Presence of intraocular lens: Secondary | ICD-10-CM | POA: Diagnosis not present

## 2023-03-04 DIAGNOSIS — Z89512 Acquired absence of left leg below knee: Secondary | ICD-10-CM | POA: Diagnosis not present

## 2023-03-05 ENCOUNTER — Encounter: Payer: Self-pay | Admitting: Family Medicine

## 2023-03-08 ENCOUNTER — Observation Stay (HOSPITAL_COMMUNITY): Payer: PPO

## 2023-03-08 ENCOUNTER — Other Ambulatory Visit: Payer: Self-pay

## 2023-03-08 ENCOUNTER — Inpatient Hospital Stay (HOSPITAL_COMMUNITY)
Admission: EM | Admit: 2023-03-08 | Discharge: 2023-03-13 | DRG: 057 | Disposition: A | Discharge status: Home Health | Payer: PPO | Attending: Internal Medicine | Admitting: Internal Medicine

## 2023-03-08 ENCOUNTER — Emergency Department (HOSPITAL_COMMUNITY): Payer: PPO

## 2023-03-08 ENCOUNTER — Encounter (HOSPITAL_COMMUNITY): Payer: Self-pay

## 2023-03-08 DIAGNOSIS — E781 Pure hyperglyceridemia: Secondary | ICD-10-CM | POA: Diagnosis present

## 2023-03-08 DIAGNOSIS — N1832 Chronic kidney disease, stage 3b: Secondary | ICD-10-CM | POA: Diagnosis not present

## 2023-03-08 DIAGNOSIS — N183 Chronic kidney disease, stage 3 unspecified: Secondary | ICD-10-CM

## 2023-03-08 DIAGNOSIS — I739 Peripheral vascular disease, unspecified: Secondary | ICD-10-CM | POA: Diagnosis not present

## 2023-03-08 DIAGNOSIS — R9082 White matter disease, unspecified: Secondary | ICD-10-CM | POA: Diagnosis not present

## 2023-03-08 DIAGNOSIS — Z89612 Acquired absence of left leg above knee: Secondary | ICD-10-CM

## 2023-03-08 DIAGNOSIS — I2510 Atherosclerotic heart disease of native coronary artery without angina pectoris: Secondary | ICD-10-CM

## 2023-03-08 DIAGNOSIS — R29898 Other symptoms and signs involving the musculoskeletal system: Secondary | ICD-10-CM | POA: Diagnosis not present

## 2023-03-08 DIAGNOSIS — I6521 Occlusion and stenosis of right carotid artery: Secondary | ICD-10-CM | POA: Diagnosis not present

## 2023-03-08 DIAGNOSIS — I4891 Unspecified atrial fibrillation: Secondary | ICD-10-CM | POA: Diagnosis present

## 2023-03-08 DIAGNOSIS — E871 Hypo-osmolality and hyponatremia: Secondary | ICD-10-CM

## 2023-03-08 DIAGNOSIS — N400 Enlarged prostate without lower urinary tract symptoms: Secondary | ICD-10-CM | POA: Diagnosis present

## 2023-03-08 DIAGNOSIS — I6389 Other cerebral infarction: Secondary | ICD-10-CM | POA: Diagnosis not present

## 2023-03-08 DIAGNOSIS — Z7901 Long term (current) use of anticoagulants: Secondary | ICD-10-CM | POA: Diagnosis not present

## 2023-03-08 DIAGNOSIS — D696 Thrombocytopenia, unspecified: Secondary | ICD-10-CM

## 2023-03-08 DIAGNOSIS — I639 Cerebral infarction, unspecified: Secondary | ICD-10-CM

## 2023-03-08 DIAGNOSIS — Z89512 Acquired absence of left leg below knee: Secondary | ICD-10-CM

## 2023-03-08 DIAGNOSIS — D649 Anemia, unspecified: Secondary | ICD-10-CM | POA: Diagnosis not present

## 2023-03-08 DIAGNOSIS — E119 Type 2 diabetes mellitus without complications: Secondary | ICD-10-CM

## 2023-03-08 DIAGNOSIS — G992 Myelopathy in diseases classified elsewhere: Secondary | ICD-10-CM | POA: Diagnosis present

## 2023-03-08 DIAGNOSIS — R531 Weakness: Secondary | ICD-10-CM | POA: Diagnosis not present

## 2023-03-08 DIAGNOSIS — Z87442 Personal history of urinary calculi: Secondary | ICD-10-CM

## 2023-03-08 DIAGNOSIS — Z87891 Personal history of nicotine dependence: Secondary | ICD-10-CM

## 2023-03-08 DIAGNOSIS — I482 Chronic atrial fibrillation, unspecified: Secondary | ICD-10-CM | POA: Diagnosis not present

## 2023-03-08 DIAGNOSIS — R9089 Other abnormal findings on diagnostic imaging of central nervous system: Secondary | ICD-10-CM | POA: Diagnosis not present

## 2023-03-08 DIAGNOSIS — G8191 Hemiplegia, unspecified affecting right dominant side: Secondary | ICD-10-CM | POA: Diagnosis not present

## 2023-03-08 DIAGNOSIS — I6501 Occlusion and stenosis of right vertebral artery: Secondary | ICD-10-CM | POA: Diagnosis not present

## 2023-03-08 DIAGNOSIS — R29707 NIHSS score 7: Secondary | ICD-10-CM | POA: Diagnosis present

## 2023-03-08 DIAGNOSIS — Z9861 Coronary angioplasty status: Secondary | ICD-10-CM

## 2023-03-08 DIAGNOSIS — E1151 Type 2 diabetes mellitus with diabetic peripheral angiopathy without gangrene: Secondary | ICD-10-CM | POA: Diagnosis present

## 2023-03-08 DIAGNOSIS — Z8249 Family history of ischemic heart disease and other diseases of the circulatory system: Secondary | ICD-10-CM

## 2023-03-08 DIAGNOSIS — Z951 Presence of aortocoronary bypass graft: Secondary | ICD-10-CM

## 2023-03-08 DIAGNOSIS — I6782 Cerebral ischemia: Secondary | ICD-10-CM | POA: Diagnosis not present

## 2023-03-08 DIAGNOSIS — I10 Essential (primary) hypertension: Secondary | ICD-10-CM | POA: Diagnosis not present

## 2023-03-08 DIAGNOSIS — Z823 Family history of stroke: Secondary | ICD-10-CM

## 2023-03-08 DIAGNOSIS — G1220 Motor neuron disease, unspecified: Secondary | ICD-10-CM

## 2023-03-08 DIAGNOSIS — I672 Cerebral atherosclerosis: Secondary | ICD-10-CM | POA: Diagnosis not present

## 2023-03-08 DIAGNOSIS — E1152 Type 2 diabetes mellitus with diabetic peripheral angiopathy with gangrene: Secondary | ICD-10-CM

## 2023-03-08 DIAGNOSIS — I6503 Occlusion and stenosis of bilateral vertebral arteries: Secondary | ICD-10-CM | POA: Diagnosis not present

## 2023-03-08 DIAGNOSIS — Z8719 Personal history of other diseases of the digestive system: Secondary | ICD-10-CM

## 2023-03-08 DIAGNOSIS — I4819 Other persistent atrial fibrillation: Secondary | ICD-10-CM | POA: Diagnosis present

## 2023-03-08 DIAGNOSIS — I4892 Unspecified atrial flutter: Secondary | ICD-10-CM | POA: Diagnosis present

## 2023-03-08 DIAGNOSIS — Z961 Presence of intraocular lens: Secondary | ICD-10-CM | POA: Diagnosis present

## 2023-03-08 DIAGNOSIS — I129 Hypertensive chronic kidney disease with stage 1 through stage 4 chronic kidney disease, or unspecified chronic kidney disease: Secondary | ICD-10-CM | POA: Diagnosis present

## 2023-03-08 DIAGNOSIS — G8252 Quadriplegia, C1-C4 incomplete: Principal | ICD-10-CM | POA: Diagnosis present

## 2023-03-08 DIAGNOSIS — E1122 Type 2 diabetes mellitus with diabetic chronic kidney disease: Secondary | ICD-10-CM | POA: Diagnosis present

## 2023-03-08 DIAGNOSIS — H9190 Unspecified hearing loss, unspecified ear: Secondary | ICD-10-CM | POA: Diagnosis present

## 2023-03-08 DIAGNOSIS — Z9841 Cataract extraction status, right eye: Secondary | ICD-10-CM

## 2023-03-08 DIAGNOSIS — Z9842 Cataract extraction status, left eye: Secondary | ICD-10-CM

## 2023-03-08 DIAGNOSIS — Z888 Allergy status to other drugs, medicaments and biological substances status: Secondary | ICD-10-CM

## 2023-03-08 DIAGNOSIS — Z79899 Other long term (current) drug therapy: Secondary | ICD-10-CM

## 2023-03-08 DIAGNOSIS — Z86010 Personal history of colonic polyps: Secondary | ICD-10-CM

## 2023-03-08 DIAGNOSIS — M4802 Spinal stenosis, cervical region: Secondary | ICD-10-CM | POA: Diagnosis present

## 2023-03-08 DIAGNOSIS — G4733 Obstructive sleep apnea (adult) (pediatric): Secondary | ICD-10-CM | POA: Diagnosis present

## 2023-03-08 DIAGNOSIS — E785 Hyperlipidemia, unspecified: Secondary | ICD-10-CM

## 2023-03-08 LAB — ECHOCARDIOGRAM COMPLETE
AR max vel: 2.19 cm2
AV Area VTI: 2.27 cm2
AV Area mean vel: 2.15 cm2
AV Mean grad: 9 mmHg
AV Peak grad: 16.9 mmHg
AV Vena cont: 0.8 cm
Ao pk vel: 2.06 m/s
Area-P 1/2: 4.43 cm2
Height: 65 in
S' Lateral: 3.2 cm
Weight: 2560 oz

## 2023-03-08 LAB — URINALYSIS, MICROSCOPIC (REFLEX)

## 2023-03-08 LAB — COMPREHENSIVE METABOLIC PANEL
ALT: 32 U/L (ref 0–44)
AST: 56 U/L — ABNORMAL HIGH (ref 15–41)
Albumin: 3 g/dL — ABNORMAL LOW (ref 3.5–5.0)
Alkaline Phosphatase: 53 U/L (ref 38–126)
Anion gap: 9 (ref 5–15)
BUN: 16 mg/dL (ref 8–23)
CO2: 22 mmol/L (ref 22–32)
Calcium: 8.4 mg/dL — ABNORMAL LOW (ref 8.9–10.3)
Chloride: 99 mmol/L (ref 98–111)
Creatinine, Ser: 1.19 mg/dL (ref 0.61–1.24)
GFR, Estimated: 57 mL/min — ABNORMAL LOW (ref >60)
Glucose, Bld: 106 mg/dL — ABNORMAL HIGH (ref 70–99)
Potassium: 3.7 mmol/L (ref 3.5–5.1)
Sodium: 130 mmol/L — ABNORMAL LOW (ref 135–145)
Total Bilirubin: 0.7 mg/dL (ref 0.3–1.2)
Total Protein: 6.1 g/dL — ABNORMAL LOW (ref 6.5–8.1)

## 2023-03-08 LAB — I-STAT CHEM 8, ED
BUN: 16 mg/dL (ref 8–23)
Calcium, Ion: 1.17 mmol/L (ref 1.15–1.40)
Chloride: 100 mmol/L (ref 98–111)
Creatinine, Ser: 1.2 mg/dL (ref 0.61–1.24)
Glucose, Bld: 108 mg/dL — ABNORMAL HIGH (ref 70–99)
HCT: 34 % — ABNORMAL LOW (ref 39.0–52.0)
Hemoglobin: 11.6 g/dL — ABNORMAL LOW (ref 13.0–17.0)
Potassium: 3.6 mmol/L (ref 3.5–5.1)
Sodium: 135 mmol/L (ref 135–145)
TCO2: 24 mmol/L (ref 22–32)

## 2023-03-08 LAB — ETHANOL: Alcohol, Ethyl (B): <10 mg/dL (ref <10)

## 2023-03-08 LAB — RAPID URINE DRUG SCREEN, HOSP PERFORMED
Amphetamines: NOT DETECTED
Barbiturates: NOT DETECTED
Benzodiazepines: NOT DETECTED
Cocaine: NOT DETECTED
Opiates: NOT DETECTED
Tetrahydrocannabinol: NOT DETECTED

## 2023-03-08 LAB — APTT: aPTT: 38 seconds — ABNORMAL HIGH (ref 24–36)

## 2023-03-08 LAB — DIFFERENTIAL
Abs Immature Granulocytes: 0.02 10*3/uL (ref 0.00–0.07)
Basophils Absolute: 0.1 10*3/uL (ref 0.0–0.1)
Basophils Relative: 1 %
Eosinophils Absolute: 0.1 10*3/uL (ref 0.0–0.5)
Eosinophils Relative: 2 %
Immature Granulocytes: 0 %
Lymphocytes Relative: 40 %
Lymphs Abs: 2.6 10*3/uL (ref 0.7–4.0)
Monocytes Absolute: 0.9 10*3/uL (ref 0.1–1.0)
Monocytes Relative: 14 %
Neutro Abs: 2.7 10*3/uL (ref 1.7–7.7)
Neutrophils Relative %: 43 %

## 2023-03-08 LAB — CBC
HCT: 35 % — ABNORMAL LOW (ref 39.0–52.0)
Hemoglobin: 11 g/dL — ABNORMAL LOW (ref 13.0–17.0)
MCH: 28.3 pg (ref 26.0–34.0)
MCHC: 31.4 g/dL (ref 30.0–36.0)
MCV: 90 fL (ref 80.0–100.0)
Platelets: 107 10*3/uL — ABNORMAL LOW (ref 150–400)
RBC: 3.89 MIL/uL — ABNORMAL LOW (ref 4.22–5.81)
RDW: 17.2 % — ABNORMAL HIGH (ref 11.5–15.5)
WBC: 6.4 10*3/uL (ref 4.0–10.5)
nRBC: 0 % (ref 0.0–0.2)

## 2023-03-08 LAB — URINALYSIS, ROUTINE W REFLEX MICROSCOPIC
Bilirubin Urine: NEGATIVE
Glucose, UA: NEGATIVE mg/dL
Hgb urine dipstick: NEGATIVE
Ketones, ur: NEGATIVE mg/dL
Leukocytes,Ua: NEGATIVE
Nitrite: NEGATIVE
Protein, ur: 100 mg/dL — AB
Specific Gravity, Urine: 1.01 (ref 1.005–1.030)
pH: 6 (ref 5.0–8.0)

## 2023-03-08 LAB — LIPID PANEL
Cholesterol: 88 mg/dL (ref 0–200)
HDL: 33 mg/dL — ABNORMAL LOW (ref >40)
LDL Cholesterol: 39 mg/dL (ref 0–99)
Total CHOL/HDL Ratio: 2.7 RATIO
Triglycerides: 80 mg/dL (ref <150)
VLDL: 16 mg/dL (ref 0–40)

## 2023-03-08 LAB — PROTIME-INR
INR: 1.5 — ABNORMAL HIGH (ref 0.8–1.2)
Prothrombin Time: 18.1 seconds — ABNORMAL HIGH (ref 11.4–15.2)

## 2023-03-08 LAB — CBG MONITORING, ED: Glucose-Capillary: 119 mg/dL — ABNORMAL HIGH (ref 70–99)

## 2023-03-08 MED ORDER — ROSUVASTATIN CALCIUM 20 MG PO TABS
40.0000 mg | ORAL_TABLET | Freq: Every day | ORAL | Status: DC
  Administered 2023-03-08 – 2023-03-13 (×6): 40 mg via ORAL
  Filled 2023-03-08 (×6): qty 2

## 2023-03-08 MED ORDER — AMLODIPINE BESYLATE 5 MG PO TABS
10.0000 mg | ORAL_TABLET | Freq: Every day | ORAL | Status: DC
  Administered 2023-03-09 – 2023-03-13 (×5): 10 mg via ORAL
  Filled 2023-03-08 (×5): qty 2

## 2023-03-08 MED ORDER — ACETAMINOPHEN 650 MG RE SUPP: 650.0000 mg | RECTAL | Status: DC | PRN

## 2023-03-08 MED ORDER — ATENOLOL 25 MG PO TABS
12.5000 mg | ORAL_TABLET | Freq: Every day | ORAL | Status: DC
  Administered 2023-03-09 – 2023-03-12 (×3): 12.5 mg via ORAL
  Filled 2023-03-08 (×5): qty 0.5

## 2023-03-08 MED ORDER — TAMSULOSIN HCL 0.4 MG PO CAPS
0.8000 mg | ORAL_CAPSULE | Freq: Every day | ORAL | Status: DC
  Administered 2023-03-08 – 2023-03-12 (×5): 0.8 mg via ORAL
  Filled 2023-03-08 (×5): qty 2

## 2023-03-08 MED ORDER — STROKE: EARLY STAGES OF RECOVERY BOOK
Freq: Once | Status: AC
  Filled 2023-03-08 (×2): qty 1

## 2023-03-08 MED ORDER — ACETAMINOPHEN 160 MG/5ML PO SOLN: 650.0000 mg | ORAL | Status: DC | PRN

## 2023-03-08 MED ORDER — SODIUM CHLORIDE 0.9 % IV SOLN: INTRAVENOUS | Status: DC

## 2023-03-08 MED ORDER — PENTOXIFYLLINE ER 400 MG PO TBCR
400.0000 mg | EXTENDED_RELEASE_TABLET | Freq: Two times a day (BID) | ORAL | Status: DC
  Administered 2023-03-08 – 2023-03-13 (×10): 400 mg via ORAL
  Filled 2023-03-08 (×10): qty 1

## 2023-03-08 MED ORDER — ACETAMINOPHEN 325 MG PO TABS
650.0000 mg | ORAL_TABLET | ORAL | Status: DC | PRN
  Administered 2023-03-11 – 2023-03-12 (×2): 650 mg via ORAL
  Filled 2023-03-08 (×2): qty 2

## 2023-03-08 MED ORDER — SODIUM CHLORIDE 0.9 % IV SOLN: INTRAVENOUS | Status: AC

## 2023-03-08 MED ORDER — FLUTICASONE PROPIONATE 50 MCG/ACT NA SUSP
1.0000 | Freq: Every day | NASAL | Status: DC
  Administered 2023-03-10 – 2023-03-13 (×4): 1 via NASAL
  Filled 2023-03-08: qty 16

## 2023-03-08 MED ORDER — IOHEXOL 350 MG/ML SOLN
75.0000 mL | Freq: Once | INTRAVENOUS | Status: AC | PRN
  Administered 2023-03-08: 75 mL via INTRAVENOUS

## 2023-03-08 MED ORDER — FINASTERIDE 5 MG PO TABS
5.0000 mg | ORAL_TABLET | Freq: Every day | ORAL | Status: DC
  Administered 2023-03-09 – 2023-03-13 (×5): 5 mg via ORAL
  Filled 2023-03-08 (×5): qty 1

## 2023-03-08 MED ORDER — SENNOSIDES-DOCUSATE SODIUM 8.6-50 MG PO TABS: 1.0000 | ORAL_TABLET | Freq: Every evening | ORAL | Status: DC | PRN

## 2023-03-08 MED ORDER — HYDRALAZINE HCL 25 MG PO TABS
75.0000 mg | ORAL_TABLET | Freq: Two times a day (BID) | ORAL | Status: DC
  Administered 2023-03-09 – 2023-03-13 (×9): 75 mg via ORAL
  Filled 2023-03-08 (×9): qty 3

## 2023-03-08 NOTE — H&P (Addendum)
History and Physical    Patient: Eric [name]. ION:[id number] DOB: [birth year] DOA: 03/08/2023 DOS: the patient was seen and examined on 03/08/2023 PCP: [name], MD  Patient coming from: Via EMS  Chief Complaint:  Chief Complaint  Patient presents with   Weakness   HPI: Eric [name]. is a 89 y.o. male with medical history significant of atrial fibrillation/flutter Eliquis, CAD s/p CABG, diet-controlled diabetes mellitus type 2, PVD s/p right BKA , and CKD stage III presents with right-sided weakness starting 3 days ago.  Normally ambulates with the use of a walker, but noted having progressive difficulty since symptoms started.  He noted weakness in his right arm as well as leg.  Denies having any fever, palpitations, or chest pain.  He had not had any falls, but reported it being extremely difficult in order to get up or go to the restroom.  He reports missing a dose of Eliquis yesterday morning, but reports only taking the medication as prescribed with last dose taken this morning.  In the emergency department patient was noted to be afebrile with pulse 53 and atrial fibrillation, blood pressures elevated to 151/99, and all other vital signs maintained.  Labs noted WBC 6.4, hemoglobin 11, sodium 130, glucose 106, AST 56, ALT 32, total bilirubin 0.7, alcohol level undetectable,and  INR 1.5.  CT scan noted no acute abnormality.  Neurology had been consulted and thought patient likely had concern for basilar infarct on imaging.  Patient was not a thrombolytic candidate due to being outside the window.  Neurology recommended admission for completion of stroke workup.  Review of Systems: As mentioned in the history of present illness. All other systems reviewed and are negative. Past Medical History:  Diagnosis Date   Atrial flutter (HCC)    s/p RFCA   CAD (coronary artery disease)    cath 2003, occluded S-RCA, occluded S-Dx, L-LAD ok, s/p PTCA to LAD   Cataract    CKD  (chronic kidney disease) stage 3, GFR 30-59 ml/min (HCC)    Diabetes mellitus    diet controlled   Hearing aid worn    bilateral   History of kidney stones    Long term (current) use of anticoagulants    Neuromuscular disorder (HCC)    OSA (obstructive sleep apnea) 12/11   very mild, AHI 7/hr   Persistent atrial fibrillation (HCC) 09/26/2015   Pure hyperglyceridemia    PVD (peripheral vascular disease) (HCC)    angioplasty of his right lower extremity in [place] by Dr.[name] 2013   Unspecified essential hypertension    Wears dentures    full upper and lower   Past Surgical History:  Procedure Laterality Date   ABDOMINAL AORTOGRAM W/LOWER EXTREMITY N/A 11/15/2017   Procedure: ABDOMINAL AORTOGRAM W/LOWER EXTREMITY;  Surgeon: Sherren Kerns, MD;  Location: [facility];  Service: Cardiovascular;  Laterality: N/A;   Adenosine Myoview  3/06   EF 56%, neg. Ischemia   Adenosine Myoview  02/18/07   nml   AMPUTATION Left 12/15/2017   Procedure: AMPUTATION BELOW KNEE;  Surgeon: Annice Needy, MD;  Location: [facility] OR[facility];  Service: Vascular;  Laterality: Left;   ANGIOPLASTY  1/99   CAD- diogonal with rotational artherectomy   Arthrectomy     of LAD & PTCA   BLEPHAROPLASTY Bilateral    CARDIAC CATHETERIZATION  1/00   CARDIOVERSION  1/04   CARDIOVERSION  5/07   hospital- a flutter   CARPAL TUNNEL RELEASE     ?  bilateral   CATARACT EXTRACTION W/PHACO Right 07/28/2017   Procedure: CATARACT EXTRACTION PHACO AND INTRAOCULAR LENS PLACEMENT (IOC) RIGHT DIABETIC;  Surgeon: Lockie Mola, MD;  Location: [facility];  Service: Ophthalmology;  Laterality: Right;  Diabetic - diet controlled   CATARACT EXTRACTION W/PHACO Left 08/18/2017   Procedure: CATARACT EXTRACTION PHACO AND INTRAOCULAR LENS PLACEMENT (IOC);  Surgeon: Lockie Mola, MD;  Location: [facility];  Service: Ophthalmology;  Laterality: Left;  DIABETES - oral meds   COLONOSCOPY W/ BIOPSIES  10/01/06    sigmoid polyp bx neg, 3 years   COLONOSCOPY WITH PROPOFOL N/A 04/28/2021   Procedure: COLONOSCOPY WITH PROPOFOL;  Surgeon: Iva Boop, MD;  Location: [facility] ENDOSCOPY;  Service: Endoscopy;  Laterality: N/A;   CORONARY ANGIOPLASTY  4/03   cutting balloon PTCA pLAD into Diag   CORONARY ARTERY BYPASS GRAFT  2000   LIMA-LAD, SVG-RCA, SVG-Diag; SVG-Diag & SVG-RCA occluded 2003   FRACTURE SURGERY     HAND SURGERY  08/27/09   R thumb procedure wit Scaphoid Gragt and screws, Dr Merlyn Lot   LOWER EXTREMITY ANGIOGRAPHY Right 01/25/2019   Procedure: LOWER EXTREMITY ANGIOGRAPHY;  Surgeon: Renford Dills, MD;  Location: [facility];  Service: Cardiovascular;  Laterality: Right;   NM MYOVIEW LTD  4/11   normal   PERIPHERAL VASCULAR CATHETERIZATION Right 06/27/2015   Procedure: Lower Extremity Angiography;  Surgeon: Annice Needy, MD;  Location: [facility];  Service: Cardiovascular;  Laterality: Right;   PERIPHERAL VASCULAR CATHETERIZATION  06/27/2015   Procedure: Lower Extremity Intervention;  Surgeon: Annice Needy, MD;  Location: [facility];  Service: Cardiovascular;;   POLYPECTOMY  04/28/2021   Procedure: POLYPECTOMY;  Surgeon: Iva Boop, MD;  Location: [facility] ENDOSCOPY;  Service: Endoscopy;;   Social History:  reports that he has never smoked. He quit smokeless tobacco use about 30 years ago. He reports that he does not drink alcohol and does not use drugs.  Allergies  Allergen Reactions   Altace [Ramipril] Cough   Isosorbide Mononitrate Other (See Comments)    Unknown reaction   Quinidine Gluconate Rash    Family History  Problem Relation Age of Onset   Stroke Father    Aneurysm Father    Hypertension Father    Prostate cancer Brother    Hypertension Mother    Lung cancer Brother        smoker   Thrombosis Brother    Other Brother        RF valve disorder (smoker)   Lung cancer Brother        smoker   Breast cancer Sister    Liver cancer Brother    Other  Sister        cerebral hemorrhage    Prior to Admission medications   Medication Sig Start Date End Date Taking? Authorizing Provider  acetaminophen (TYLENOL) 325 MG tablet Take 2 tablets (650 mg total) by mouth every 6 (six) hours as needed for mild pain (or temp >/= 101 F). 12/21/17   Alford Highland, MD  amLODipine (NORVASC) 10 MG tablet Take 1 tablet (10 mg total) by mouth daily. 11/30/22   [name], MD  apixaban (ELIQUIS) 2.5 MG TABS tablet Take 1 tablet (2.5 mg total) by mouth 2 (two) times daily. OFFICE VISIT NEEDED FOR ADDITIONAL REFILLS 10/02/22   [name], MD  atenolol (TENORMIN) 25 MG tablet Take 1/2 tablet (12.5 mg total) by mouth daily. 02/17/23   [name],  MD  bethanechol (URECHOLINE) 10 MG tablet Take 0.5 tablets (5 mg total) by mouth 3 (three) times daily. 01/05/23   [name], MD  Cinnamon 500 MG TABS Take 500 mg by mouth daily.    [provider]  Cranberry 500 MG CAPS Take 500 mg by mouth daily.    [provider]  cyanocobalamin (VITAMIN B12) 500 MCG tablet Take 500 mcg by mouth daily.    [provider]  ferrous sulfate 325 (65 FE) MG EC tablet Take 325 mg by mouth at bedtime.    [provider]  finasteride (PROSCAR) 5 MG tablet TAKE 1 TABLET BY MOUTH ONCE DAILY Patient taking differently: Take 5 mg by mouth daily. 03/28/18   [name], MD  fluticasone (FLONASE) 50 MCG/ACT nasal spray Place 1 spray into both nostrils daily. 12/17/22   Angiulli, Mcarthur Rossetti, PA-C  hydrALAZINE (APRESOLINE) 25 MG tablet TAKE 3 TABLETS BY MOUTH IN THE MORNING AND AT BEDTIME 02/15/23   Pricilla Riffle, MD  Multiple Vitamin (DAILY MULTIVITAMIN PO) Take 1 tablet by mouth daily.    [provider]  [facility] ULTRA test strip USE 1 STRIP TO CHECK GLUCOSE ONCE DAILY 07/03/20   [name], MD  pentoxifylline (TRENTAL) 400 MG CR tablet TAKE 1 TABLET BY MOUTH EVERY 12 HOURS . Patient taking differently: Take 400 mg by mouth  every 12 (twelve) hours. TAKE 1 TABLET BY MOUTH EVERY 12 HOURS . 11/30/22   [name], MD  rosuvastatin (CRESTOR) 40 MG tablet Take 1 tablet (40 mg total) by mouth daily. 11/30/22   [name], MD  tamsulosin (FLOMAX) 0.4 MG CAPS capsule Take 2 capsules (0.8 mg total) by mouth daily after supper. 02/17/23   [name], MD  temazepam (RESTORIL) 7.5 MG capsule Take 1 capsule (7.5 mg total) by mouth at bedtime as needed for sleep. 12/17/22   Charlton Amor, PA-C    Physical Exam: Vitals:   03/08/23 1036 03/08/23 1038 03/08/23 1040 03/08/23 1042  BP:  (!) 151/99  (!) 149/71  Pulse:    (!) 53  Resp:  18  14  Temp:   97.6 F (36.4 C)   TempSrc:   Oral   SpO2:    99%  Weight: 72.6 kg     Height: 5\' 5"  (1.651 m)         Constitutional: Elderly male currently in no acute distress Eyes: PERRL, lids and conjunctivae normal ENMT: Mucous membranes are moist.   Neck: normal, supple, no masses, no thyromegaly Respiratory: clear to auscultation bilaterally, no wheezing, no crackles. Normal respiratory effort. No accessory muscle use.  Cardiovascular: Irregular irregular.  No extremity edema. 2+ pedal pulses. No carotid bruits.  Abdomen: no tenderness, no masses palpated.   Bowel sounds positive.  Musculoskeletal: no clubbing / cyanosis.Left leg BKA Skin: no rashes, lesions, ulcers. No induration Neurologic: CN 2-12 grossly intact.  Strength 2/5 in the right upper and lower extremity. Psychiatric: Normal judgment and insight. Alert and oriented x 3. Normal mood.   Data Reviewed:  EKG revealed atrial fibrillation at 67 bpm.  Reviewed labs, imaging, and pertinent records as noted in this document for  Assessment and Plan: Suspected CVA Acute.  Patient presents with right-sided weakness starting 3 days ago.  Initial CT of the brain reported as negative, but neurology thought patient likely had concern for basal ganglia stroke.  Patient out of the window for  thrombolytics. -Admit to a telemetry bed -Stroke  order set utilized -Neurochecks -Check hemoglobin A1c and lipid panel -Check CTA of the head and neck -Check MRI of the brain -Check echocardiogram -PT/OT/speech to evaluate and treat -Appreciate neurology consultative services we will follow-up for any further recommendations.  Chronic atrial fibrillation on chronic anticoagulation Patient appears to be rate controlled in atrial fibrillation.  Last dose of Eliquis was this morning. -Holding Eliquis.  Resume when cleared  Essential hypertension Blood pressures elevated up to 164/66.   -Continue home blood pressure regimen  Hyponatremia Acute.  Sodium 130.  Patient does not appear grossly fluid overloaded on physical exam.  He had previously been on Lasix. -Normal saline IV fluids at 75 mL for 1 hour. -Continue to monitor sodium levels  Normocytic anemia Hemoglobin 11 g/dL which appears near baseline. -Continue to monitor  Chronic kidney disease stage IIIb Creatinine 1.19 appears around patient's baseline. -Continue to monitor  Diet-controlled diabetes mellitus type 2 On admission glucose 106.  Last available hemoglobin A1c was 5.8 on 11/16/2022.  Patient not on any medications for treatment. -Continue to monitor  Coronary artery disease Patient with prior CABG in 2000 and subsequent PCI.  Peripheral vascular disease S/p left BKA -Continue Trental  Thrombocytopenia Platelet count 107 which appears low, but has intermittently been low previously in the past..  No reports of bleeding.  Hyperlipidemia -Follow-up lipid panel -Continue Crestor  BPH  -Continue finasteride and Flomax  DVT prophylaxis: SCDs Advance Care Planning:   Code Status: Full Code   Consults: Neurology  Family Communication: Patient's son updated over the phone  Severity of Illness: The appropriate patient status for this patient is INPATIENT. Inpatient status is judged to be reasonable and  necessary in order to provide the required intensity of service to ensure the patient's safety. The patient's presenting symptoms, physical exam findings, and initial radiographic and laboratory data in the context of their chronic comorbidities is felt to place them at high risk for further clinical deterioration. Furthermore, it is not anticipated that the patient will be medically stable for discharge from the hospital within 2 midnights of admission.   * I certify that at the point of admission it is my clinical judgment that the patient will require inpatient hospital care spanning beyond 2 midnights from the point of admission due to high intensity of service, high risk for further deterioration and high frequency of surveillance required.*  Author: Clydie Braun, MD 03/08/2023 1:51 PM  For on call review [email or web address].

## 2023-03-08 NOTE — Consult Note (Incomplete Revision)
Neurology Consultation  Reason for Consult: Right-sided weakness Referring Physician: Dr. Katrinka Blazing   CC: Weakness  History is obtained from:medical record and patient   HPI: Eric [name]. is a 89 y.o. male with past medical history of a flutter/fib on Eliquis, hyperlipidemia, hypertension, diabetes, CKD, CAD, OSA, PVD s/p BKA, normally ambulates with walker who presents to the emergency department evaluated for progressive right-sided weakness since Friday.  He states weakness started on Friday slowly became worse where he was unable to use his right arm and hand and ambulate well.  He endorses having some numbness on the right arm.  He does endorse missing his morning dose of Eliquis yesterday.  Son is at the bedside and states that he did not really see any symptoms until Sunday when patient complained of not being able to use his right side.  Patient denies facial droop, slurred speech, vision changes or headache.  CT head with no acute process.  Neurology consulted for stroke workup   LKW: Friday IV thrombolysis given?: no, outside window EVT:  No LVO Premorbid modified Rankin scale (mRS):   3-Moderate disability-requires help but walks WITHOUT assistance  ROS: Full ROS was performed and is negative except as noted in the HPI.    Past Medical History:  Diagnosis Date   Atrial flutter (HCC)    s/p RFCA   CAD (coronary artery disease)    cath 2003, occluded S-RCA, occluded S-Dx, L-LAD ok, s/p PTCA to LAD   Cataract    CKD (chronic kidney disease) stage 3, GFR 30-59 ml/min (HCC)    Diabetes mellitus    diet controlled   Hearing aid worn    bilateral   History of kidney stones    Long term (current) use of anticoagulants    Neuromuscular disorder (HCC)    OSA (obstructive sleep apnea) 12/11   very mild, AHI 7/hr   Persistent atrial fibrillation (HCC) 09/26/2015   Pure hyperglyceridemia    PVD (peripheral vascular disease) (HCC)    angioplasty of his right lower extremity in  [place] by Dr.[name] 2013   Unspecified essential hypertension    Wears dentures    full upper and lower     Family History  Problem Relation Age of Onset   Stroke Father    Aneurysm Father    Hypertension Father    Prostate cancer Brother    Hypertension Mother    Lung cancer Brother        smoker   Thrombosis Brother    Other Brother        RF valve disorder (smoker)   Lung cancer Brother        smoker   Breast cancer Sister    Liver cancer Brother    Other Sister        cerebral hemorrhage     Social History:   reports that he has never smoked. He quit smokeless tobacco use about 30 years ago. He reports that he does not drink alcohol and does not use drugs.  Medications  Current Facility-Administered Medications:    [START ON 03/09/2023]  stroke: early stages of recovery book, , Does not apply, Once, [name], [name], MD   0.9 %  sodium chloride infusion, , Intravenous, Continuous, [name], [name], MD, Last Rate: 75 mL/hr at 03/08/23 1630, New Bag at 03/08/23 1630   acetaminophen (TYLENOL) tablet 650 mg, 650 mg, Oral, Q4H PRN **OR** acetaminophen (TYLENOL) 160 MG/5ML solution 650 mg, 650 mg, Per Tube, Q4H PRN **  OR** acetaminophen (TYLENOL) suppository 650 mg, 650 mg, Rectal, Q4H PRN, [name], [name], MD   rosuvastatin (CRESTOR) tablet 40 mg, 40 mg, Oral, Daily, [name], [name], MD   senna-docusate (Senokot-S) tablet 1 tablet, 1 tablet, Oral, QHS PRN, Clydie Braun, MD  Current Outpatient Medications:    acetaminophen (TYLENOL) 325 MG tablet, Take 2 tablets (650 mg total) by mouth every 6 (six) hours as needed for mild pain (or temp >/= 101 F)., Disp: , Rfl:    amLODipine (NORVASC) 10 MG tablet, Take 1 tablet (10 mg total) by mouth daily., Disp: 90 tablet, Rfl: 1   apixaban (ELIQUIS) 2.5 MG TABS tablet, Take 1 tablet (2.5 mg total) by mouth 2 (two) times daily. OFFICE VISIT NEEDED FOR ADDITIONAL REFILLS, Disp: 180 tablet, Rfl: 1   atenolol (TENORMIN) 25 MG tablet,  Take 1/2 tablet (12.5 mg total) by mouth daily., Disp: 15 tablet, Rfl: 3   bethanechol (URECHOLINE) 10 MG tablet, Take 0.5 tablets (5 mg total) by mouth 3 (three) times daily., Disp: 45 tablet, Rfl: 1   Cinnamon 500 MG TABS, Take 500 mg by mouth daily., Disp: , Rfl:    Cranberry 500 MG CAPS, Take 500 mg by mouth daily., Disp: , Rfl:    cyanocobalamin (VITAMIN B12) 500 MCG tablet, Take 500 mcg by mouth daily., Disp: , Rfl:    ferrous sulfate 325 (65 FE) MG EC tablet, Take 325 mg by mouth at bedtime., Disp: , Rfl:    finasteride (PROSCAR) 5 MG tablet, TAKE 1 TABLET BY MOUTH ONCE DAILY (Patient taking differently: Take 5 mg by mouth daily.), Disp: 90 tablet, Rfl: 3   fluticasone (FLONASE) 50 MCG/ACT nasal spray, Place 1 spray into both nostrils daily., Disp: , Rfl: 2   furosemide (LASIX) 40 MG tablet, Take 40 mg by mouth daily., Disp: , Rfl:    hydrALAZINE (APRESOLINE) 25 MG tablet, TAKE 3 TABLETS BY MOUTH IN THE MORNING AND AT BEDTIME (Patient taking differently: Take 75 mg by mouth 2 (two) times daily.), Disp: 180 tablet, Rfl: 2   Multiple Vitamin (DAILY MULTIVITAMIN PO), Take 1 tablet by mouth daily., Disp: , Rfl:    pentoxifylline (TRENTAL) 400 MG CR tablet, TAKE 1 TABLET BY MOUTH EVERY 12 HOURS . (Patient taking differently: Take 400 mg by mouth every 12 (twelve) hours. TAKE 1 TABLET BY MOUTH EVERY 12 HOURS .), Disp: 180 tablet, Rfl: 1   rosuvastatin (CRESTOR) 40 MG tablet, Take 1 tablet (40 mg total) by mouth daily., Disp: 90 tablet, Rfl: 1   tamsulosin (FLOMAX) 0.4 MG CAPS capsule, Take 2 capsules (0.8 mg total) by mouth daily after supper., Disp: 60 capsule, Rfl: 3   ONETOUCH ULTRA test strip, USE 1 STRIP TO CHECK GLUCOSE ONCE DAILY (Patient taking differently: 1 each by Other route See admin instructions. USE 1 STRIP TO CHECK GLUCOSE ONCE DAILY), Disp: 50 each, Rfl: 0   temazepam (RESTORIL) 7.5 MG capsule, Take 1 capsule (7.5 mg total) by mouth at bedtime as needed for sleep. (Patient not  taking: Reported on 03/08/2023), Disp: 15 capsule, Rfl: 0   Exam: Current vital signs: BP (!) 147/89 (BP Location: Right Arm)   Pulse 72   Temp 98.3 F (36.8 C) (Oral)   Resp 16   Ht 5\' 5"  (1.651 m)   Wt 72.6 kg   SpO2 98%   BMI 26.63 kg/m  Vital signs in last 24 hours: Temp:  [97.6 F (36.4 C)-98.3 F (36.8 C)] 98.3 F (36.8 C) (07/29 1529)  Pulse Rate:  [53-74] 72 (07/29 1529) Resp:  [14-18] 16 (07/29 1529) BP: (147-164)/(66-99) 147/89 (07/29 1529) SpO2:  [97 %-99 %] 98 % (07/29 1529) Weight:  [72.6 kg] 72.6 kg (07/29 1036)  GENERAL: Awake, alert in NAD HEENT: - Normocephalic and atraumatic, dry mm LUNGS - Clear to auscultation bilaterally with no wheezes CV - S1S2 RRR, no m/r/g, equal pulses bilaterally. ABDOMEN - Soft, nontender, nondistended with normoactive BS Ext: warm, well perfused, intact peripheral pulses, no edema, Left BKA  NEURO:  Mental Status: AA&Ox4. HOH follows commands  Language: speech is clear. HOH.  Naming, repetition, fluency, and comprehension intact. Cranial Nerves: PERRL EOMI, visual fields full, slight right facial asymmetry, facial sensation intact, hearing intact, tongue/uvula/soft palate midline, normal sternocleidomastoid and trapezius muscle strength. No evidence of tongue atrophy or fibrillations Motor: Left arm 5/5, right arm 3/5, left BKA, right leg 3/5 Tone: is normal and bulk is normal Sensation- Intact to light touch bilaterally Coordination: FTN intact bilaterally, no ataxia in BLE. Gait- deferred  NIHSS 1a Level of Conscious.: 0 1b LOC Questions: 0 1c LOC Commands: 0 2 Best Gaze: 0 3 Visual: 0 4 Facial Palsy: 1 5a Motor Arm - left: 0 5b Motor Arm - Right: 3 6a Motor Leg - Left: 0 6b Motor Leg - Right: 3 7 Limb Ataxia: 0 8 Sensory: 0 9 Best Language: 0 10 Dysarthria: 0 11 Extinct. and Inatten.: 0 TOTAL: 7   Labs I have reviewed labs in epic and the results pertinent to this consultation are:  CBC    Component  Value Date/Time   WBC 6.4 03/08/2023 1051   RBC 3.89 (L) 03/08/2023 1051   HGB 11.6 (L) 03/08/2023 1137   HGB 11.7 (L) 09/28/2022 1024   HCT 34.0 (L) 03/08/2023 1137   HCT 35.2 (L) 09/28/2022 1024   PLT 107 (L) 03/08/2023 1051   PLT 216 09/28/2022 1024   MCV 90.0 03/08/2023 1051   MCV 89 09/28/2022 1024   MCH 28.3 03/08/2023 1051   MCHC 31.4 03/08/2023 1051   RDW 17.2 (H) 03/08/2023 1051   RDW 14.6 09/28/2022 1024   LYMPHSABS 2.6 03/08/2023 1051   MONOABS 0.9 03/08/2023 1051   EOSABS 0.1 03/08/2023 1051   BASOSABS 0.1 03/08/2023 1051    CMP     Component Value Date/Time   NA 135 03/08/2023 1137   NA 139 10/05/2022 1412   NA 139 04/28/2012 0658   K 3.6 03/08/2023 1137   K 4.2 04/28/2012 0658   CL 100 03/08/2023 1137   CL 106 04/28/2012 0658   CO2 22 03/08/2023 1050   CO2 25 04/28/2012 0658   GLUCOSE 108 (H) 03/08/2023 1137   GLUCOSE 114 (H) 04/28/2012 0658   BUN 16 03/08/2023 1137   BUN 24 10/05/2022 1412   BUN 32 (H) 04/28/2012 0658   CREATININE 1.20 03/08/2023 1137   CREATININE 1.23 (H) 12/28/2022 1648   CALCIUM 8.4 (L) 03/08/2023 1050   CALCIUM 8.9 04/28/2012 0658   PROT 6.1 (L) 03/08/2023 1050   ALBUMIN 3.0 (L) 03/08/2023 1050   AST 56 (H) 03/08/2023 1050   ALT 32 03/08/2023 1050   ALKPHOS 53 03/08/2023 1050   BILITOT 0.7 03/08/2023 1050   GFRNONAA 57 (L) 03/08/2023 1050   GFRNONAA 39 (L) 12/31/2020 0833   GFRAA 45 (L) 12/31/2020 0833    Lipid Panel     Component Value Date/Time   CHOL 88 03/08/2023 1049   CHOL 83 (L) 12/09/2020 1140   TRIG 80 03/08/2023  1049   HDL 33 (L) 03/08/2023 1049   HDL 30 (L) 12/09/2020 1140   CHOLHDL 2.7 03/08/2023 1049   VLDL 16 03/08/2023 1049   LDLCALC 39 03/08/2023 1049   LDLCALC 44 11/16/2022 1113   LDLDIRECT 99.1 02/19/2011 0823    Lab Results  Component Value Date   HGBA1C 5.8 (H) 11/16/2022      Imaging I have reviewed the images obtained:  CT-head no acute process  LDL 39  Assessment:  89 y.o. male  with past medical history of a flutter/fib on Eliquis, hyperlipidemia, hypertension, diabetes, CKD, CAD, OSA, PVD s/p BKA, normally ambulates with walker who presents to the emergency department evaluated for progressive right-sided weakness since Friday.    Impression:   Recommendations: - HgbA1c -Continue home statin - MRI of the brain without contrast - Frequent neuro checks -Bedside swallow screen.  If passes may have a diet - Echocardiogram - CTA head and neck -Hold home Eliquis for now.   - Risk factor modification - Telemetry monitoring - PT consult, OT consult, Speech consult - Stroke team to follow  Gevena Mart DNP, ACNPC-AG  [facility]

## 2023-03-08 NOTE — ED Provider Notes (Signed)
[place] [facility] AT [facility] Provider Note   CSN: [id number] Arrival date & time: 03/08/23  1023     History  Chief Complaint  Patient presents with   Weakness   Patient is a 89 year old male with past medical history significant of atrial fibrillation/flutter Eliquis, CAD s/p CABG, diet-controlled diabetes mellitus type 2, PVD s/p right BKA , and CKD stage III, presenting today for right-sided weakness.  He states that symptoms began 3 days ago.  He initially noted increased weakness of his right lower extremity, followed by weakness in his right arm.  He admits to associated numbness as well.  He denies any facial weakness or numbness.  He has not had any slurred speech.  He is on Eliquis.  He denies any falls or injuries.  He has not struck his head.  He denies any headache.  He did not have any dizziness, nausea, or vomiting at this time.  He has no chest pain or shortness of breath.  He states he came in today after symptoms had been ongoing for 3 days as he is no longer able to perform his normal activities such as safe ambulation.     Home Medications Prior to Admission medications   Medication Sig Start Date End Date Taking? Authorizing Provider  acetaminophen (TYLENOL) 325 MG tablet Take 2 tablets (650 mg total) by mouth every 6 (six) hours as needed for mild pain (or temp >/= 101 F). 12/21/17  Yes Wieting, Richard, MD  amLODipine (NORVASC) 10 MG tablet Take 1 tablet (10 mg total) by mouth daily. 11/30/22  Yes [name], MD  apixaban (ELIQUIS) 2.5 MG TABS tablet Take 1 tablet (2.5 mg total) by mouth 2 (two) times daily. OFFICE VISIT NEEDED FOR ADDITIONAL REFILLS 10/02/22  Yes [name], MD  atenolol (TENORMIN) 25 MG tablet Take 1/2 tablet (12.5 mg total) by mouth daily. 02/17/23  Yes [name], MD  bethanechol (URECHOLINE) 10 MG tablet Take 0.5 tablets (5 mg total) by mouth 3 (three) times daily. 01/05/23  Yes [name], MD   Cinnamon 500 MG TABS Take 500 mg by mouth daily.   Yes [provider]  Cranberry 500 MG CAPS Take 500 mg by mouth daily.   Yes [provider]  cyanocobalamin (VITAMIN B12) 500 MCG tablet Take 500 mcg by mouth daily.   Yes [provider]  ferrous sulfate 325 (65 FE) MG EC tablet Take 325 mg by mouth at bedtime.   Yes [provider]  finasteride (PROSCAR) 5 MG tablet TAKE 1 TABLET BY MOUTH ONCE DAILY Patient taking differently: Take 5 mg by mouth daily. 03/28/18  Yes [name], MD  fluticasone (FLONASE) 50 MCG/ACT nasal spray Place 1 spray into both nostrils daily. 12/17/22  Yes Angiulli, Mcarthur Rossetti, PA-C  furosemide (LASIX) 40 MG tablet Take 40 mg by mouth daily.   Yes [provider]  hydrALAZINE (APRESOLINE) 25 MG tablet TAKE 3 TABLETS BY MOUTH IN THE MORNING AND AT BEDTIME Patient taking differently: Take 75 mg by mouth 2 (two) times daily. 02/15/23  Yes Pricilla Riffle, MD  Multiple Vitamin (DAILY MULTIVITAMIN PO) Take 1 tablet by mouth daily.   Yes [provider]  pentoxifylline (TRENTAL) 400 MG CR tablet TAKE 1 TABLET BY MOUTH EVERY 12 HOURS . Patient taking differently: Take 400 mg by mouth every 12 (twelve) hours. TAKE 1 TABLET BY MOUTH EVERY 12 HOURS . 11/30/22  Yes [name], MD  rosuvastatin (CRESTOR) 40 MG tablet Take 1 tablet (40 mg total) by mouth daily. 11/30/22  Yes [name], MD  tamsulosin (FLOMAX) 0.4 MG CAPS capsule Take 2 capsules (0.8 mg total) by mouth daily after supper. 02/17/23  Yes [name], MD  ONETOUCH ULTRA test strip USE 1 STRIP TO CHECK GLUCOSE ONCE DAILY Patient taking differently: 1 each by Other route See admin instructions. USE 1 STRIP TO CHECK GLUCOSE ONCE DAILY 07/03/20   [name], MD  temazepam (RESTORIL) 7.5 MG capsule Take 1 capsule (7.5 mg total) by mouth at bedtime as needed for sleep. Patient not taking: Reported on 03/08/2023 12/17/22   Angiulli, Mcarthur Rossetti, PA-C       Allergies    Altace [ramipril], Isosorbide mononitrate, and Quinidine gluconate    Review of Systems   Negative except for as noted above in HPI  Physical Exam Updated Vital Signs BP (!) 147/89 (BP Location: Right Arm)   Pulse 72   Temp 98.3 F (36.8 C) (Oral)   Resp 16   Ht 5\' 5"  (1.651 m)   Wt 72.6 kg   SpO2 98%   BMI 26.63 kg/m  Physical Exam Vitals and nursing note reviewed.  Constitutional:      General: He is not in acute distress.    Appearance: Normal appearance. He is well-developed.  HENT:     Head: Normocephalic and atraumatic.     Mouth/Throat:     Mouth: Mucous membranes are moist.  Eyes:     Extraocular Movements: Extraocular movements intact.     Conjunctiva/sclera: Conjunctivae normal.     Pupils: Pupils are equal, round, and reactive to light.  Neck:     Comments: No cervical spine tenderness and no nuchal rigidity Cardiovascular:     Rate and Rhythm: Normal rate. Rhythm irregular.     Heart sounds: No murmur heard. Pulmonary:     Effort: Pulmonary effort is normal. No respiratory distress.     Breath sounds: Normal breath sounds.     Comments: Saturating well on room air Abdominal:     General: There is no distension.     Palpations: Abdomen is soft.     Tenderness: There is no abdominal tenderness. There is no guarding.  Musculoskeletal:        General: No swelling or tenderness.     Cervical back: Neck supple.     Comments: Left-sided BKA  Skin:    General: Skin is warm and dry.     Capillary Refill: Capillary refill takes less than 2 seconds.  Neurological:     Mental Status: He is alert and oriented to person, place, and time.     Comments: Cranial nerves II through XII intact, strength exam reveals 2/5 strength in the right upper and lower extremities.  Strength 5/5 in the left upper and lower extremities.  Decreased sensation present over the right upper and lower extremities.  Unable to perform finger-nose on the right due to weakness   Psychiatric:        Mood and Affect: Mood normal.     ED Results / Procedures / Treatments   Labs (all labs ordered are listed, but only abnormal results are displayed) Labs Reviewed  PROTIME-INR - Abnormal; Notable for the following components:      Result Value   Prothrombin Time 18.1 (*)    INR 1.5 (*)    All other components within normal limits  APTT - Abnormal; Notable for the following components:  aPTT 38 (*)    All other components within normal limits  COMPREHENSIVE METABOLIC PANEL - Abnormal; Notable for the following components:   Sodium 130 (*)    Glucose, Bld 106 (*)    Calcium 8.4 (*)    Total Protein 6.1 (*)    Albumin 3.0 (*)    AST 56 (*)    GFR, Estimated 57 (*)    All other components within normal limits  URINALYSIS, ROUTINE W REFLEX MICROSCOPIC - Abnormal; Notable for the following components:   Protein, ur 100 (*)    All other components within normal limits  CBC - Abnormal; Notable for the following components:   RBC 3.89 (*)    Hemoglobin 11.0 (*)    HCT 35.0 (*)    RDW 17.2 (*)    Platelets 107 (*)    All other components within normal limits  LIPID PANEL - Abnormal; Notable for the following components:   HDL 33 (*)    All other components within normal limits  URINALYSIS, MICROSCOPIC (REFLEX) - Abnormal; Notable for the following components:   Bacteria, UA RARE (*)    All other components within normal limits  I-STAT CHEM 8, ED - Abnormal; Notable for the following components:   Glucose, Bld 108 (*)    Hemoglobin 11.6 (*)    HCT 34.0 (*)    All other components within normal limits  CBG MONITORING, ED - Abnormal; Notable for the following components:   Glucose-Capillary 119 (*)    All other components within normal limits  ETHANOL  RAPID URINE DRUG SCREEN, HOSP PERFORMED  DIFFERENTIAL  HEMOGLOBIN A1C  CBC  COMPREHENSIVE METABOLIC PANEL    EKG EKG Interpretation Date/Time:  Monday March 08 2023 10:41:08 EDT Ventricular Rate:   67 PR Interval:    QRS Duration:  103 QT Interval:  451 QTC Calculation: 477 R Axis:   45  Text Interpretation: Atrial fibrillation Borderline low voltage, extremity leads Borderline prolonged QT interval No significant change since last tracing Confirmed by Melene Plan [phone]) on 03/08/2023 10:42:20 AM  Radiology ECHOCARDIOGRAM COMPLETE  Result Date: 03/08/2023    ECHOCARDIOGRAM REPORT   Patient Name:   Arbor Bolosan. Date of Exam: 03/08/2023 Medical Rec #:  [id number]           Height:       65.0 in Accession #:    [id number]          Weight:       160.0 lb Date of Birth:  07-21-1928           BSA:          1.799 m Patient Age:    89 years            BP:           147/89 mmHg Patient Gender: M                   HR:           53 bpm. Exam Location:  Inpatient Procedure: 2D Echo, Cardiac Doppler and Color Doppler Indications:    Stroke I63.9  History:        Patient has prior history of Echocardiogram examinations, most                 recent 03/04/2021. CAD, PAD, Stroke and CKD, Arrythmias:Atrial                 Fibrillation; Risk Factors:Dyslipidemia, Diabetes,  Non-Smoker,                 Hypertension and Sleep Apnea.  Sonographer:    Dondra Prader RVT RCS Referring Phys: [phone] RONDELL A SMITH IMPRESSIONS  1. Left ventricular ejection fraction, by estimation, is 55 to 60%. The left ventricle has normal function. The left ventricle has no regional wall motion abnormalities. There is moderate concentric left ventricular hypertrophy. Left ventricular diastolic parameters are consistent with Grade III diastolic dysfunction (restrictive).  2. Right ventricular systolic function is normal. The right ventricular size is moderately enlarged. There is mildly elevated pulmonary artery systolic pressure. The estimated right ventricular systolic pressure is 42.0 mmHg.  3. Left atrial size was severely dilated.  4. Right atrial size was severely dilated.  5. The mitral valve is grossly normal. Trivial mitral valve  regurgitation. Moderate mitral annular calcification.  6. Tricuspid valve regurgitation is moderate.  7. The aortic valve is calcified. There is severe calcifcation of the aortic valve. There is moderate thickening of the aortic valve. Aortic valve regurgitation is mild. Aortic valve mean gradient measures 9.0 mmHg. Aortic valve Vmax measures 2.06 m/s.  8. The inferior vena cava is normal in size with greater than 50% respiratory variability, suggesting right atrial pressure of 3 mmHg. FINDINGS  Left Ventricle: Left ventricular ejection fraction, by estimation, is 55 to 60%. The left ventricle has normal function. The left ventricle has no regional wall motion abnormalities. The left ventricular internal cavity size was normal in size. There is  moderate concentric left ventricular hypertrophy. Left ventricular diastolic function could not be evaluated due to atrial fibrillation. Left ventricular diastolic parameters are consistent with Grade III diastolic dysfunction (restrictive). Right Ventricle: The right ventricular size is moderately enlarged. No increase in right ventricular wall thickness. Right ventricular systolic function is normal. There is mildly elevated pulmonary artery systolic pressure. The tricuspid regurgitant velocity is 3.04 m/s, and with an assumed right atrial pressure of 5 mmHg, the estimated right ventricular systolic pressure is 42.0 mmHg. Left Atrium: Left atrial size was severely dilated. Right Atrium: Right atrial size was severely dilated. Pericardium: There is no evidence of pericardial effusion. Mitral Valve: The mitral valve is grossly normal. There is mild calcification of the mitral valve leaflet(s). Moderate mitral annular calcification. Trivial mitral valve regurgitation. Tricuspid Valve: The tricuspid valve is normal in structure. Tricuspid valve regurgitation is moderate. Aortic Valve: The aortic valve is calcified. There is severe calcifcation of the aortic valve. There is  moderate thickening of the aortic valve. There is moderate aortic valve annular calcification. Aortic valve regurgitation is mild. Aortic valve mean gradient measures 9.0 mmHg. Aortic valve peak gradient measures 16.9 mmHg. Aortic valve area, by VTI measures 2.27 cm. Pulmonic Valve: The pulmonic valve was normal in structure. Pulmonic valve regurgitation is trivial. Aorta: The aortic root and ascending aorta are structurally normal, with no evidence of dilitation. Venous: The inferior vena cava is normal in size with greater than 50% respiratory variability, suggesting right atrial pressure of 3 mmHg. IAS/Shunts: No atrial level shunt detected by color flow Doppler.  LEFT VENTRICLE PLAX 2D LVIDd:         4.70 cm   Diastology LVIDs:         3.20 cm   LV e' medial:    7.40 cm/s LV PW:         1.20 cm   LV E/e' medial:  17.7 LV IVS:        1.30 cm  LV e' lateral:   13.95 cm/s LVOT diam:     1.90 cm   LV E/e' lateral: 9.4 LV SV:         96 LV SV Index:   53 LVOT Area:     2.84 cm  RIGHT VENTRICLE             IVC RV Basal diam:  4.20 cm     IVC diam: 1.60 cm RV S prime:     13.50 cm/s TAPSE (M-mode): 1.9 cm LEFT ATRIUM              Index        RIGHT ATRIUM           Index LA diam:        5.40 cm  3.00 cm/m   RA Area:     38.20 cm LA Vol (A2C):   155.0 ml 86.15 ml/m  RA Volume:   149.00 ml 82.82 ml/m LA Vol (A4C):   130.0 ml 72.26 ml/m LA Biplane Vol: 157.0 ml 87.26 ml/m  AORTIC VALVE                     PULMONIC VALVE AV Area (Vmax):    2.19 cm      PV Vmax:       1.13 m/s AV Area (Vmean):   2.15 cm      PV Peak grad:  5.1 mmHg AV Area (VTI):     2.27 cm AV Vmax:           205.67 cm/s AV Vmean:          140.000 cm/s AV VTI:            0.422 m AV Peak Grad:      16.9 mmHg AV Mean Grad:      9.0 mmHg LVOT Vmax:         159.00 cm/s LVOT Vmean:        106.333 cm/s LVOT VTI:          0.337 m LVOT/AV VTI ratio: 0.80 AR Vena Contracta: 0.80 cm  AORTA Ao Root diam: 3.60 cm Ao Asc diam:  3.60 cm MITRAL VALVE                 TRICUSPID VALVE MV Area (PHT): 4.43 cm     TR Peak grad:   37.0 mmHg MV Decel Time: 171 msec     TR Vmax:        304.00 cm/s MV E velocity: 130.67 cm/s MV A velocity: 46.00 cm/s   SHUNTS MV E/A ratio:  2.84         Systemic VTI:  0.34 m                             Systemic Diam: 1.90 cm Aditya Sabharwal Electronically signed by Dorthula Nettles Signature Date/Time: 03/08/2023/5:39:27 PM    Final    CT HEAD WO CONTRAST  Result Date: 03/08/2023 CLINICAL DATA:  Stroke suspected EXAM: CT HEAD WITHOUT CONTRAST TECHNIQUE: Contiguous axial images were obtained from the base of the skull through the vertex without intravenous contrast. RADIATION DOSE REDUCTION: This exam was performed according to the departmental dose-optimization program which includes automated exposure control, adjustment of the mA and/or kV according to patient size and/or use of iterative reconstruction technique. COMPARISON:  None Available. FINDINGS: Brain: No evidence of acute infarction, hemorrhage, hydrocephalus, extra-axial collection  or mass lesion/mass effect. Periventricular white matter hypodensity. Vascular: No hyperdense vessel or unexpected calcification. Skull: Normal. Negative for fracture or focal lesion. Sinuses/Orbits: No acute finding. Other: None. IMPRESSION: No acute intracranial pathology. Small-vessel white matter disease. MRI may be used to more sensitively evaluate for acute infarction if clinically suspected. Electronically Signed   By: Jearld Lesch M.D.   On: 03/08/2023 12:47      Medications Ordered in ED Medications   stroke: early stages of recovery book (has no administration in time range)  acetaminophen (TYLENOL) tablet 650 mg (has no administration in time range)    Or  acetaminophen (TYLENOL) 160 MG/5ML solution 650 mg (has no administration in time range)    Or  acetaminophen (TYLENOL) suppository 650 mg (has no administration in time range)  senna-docusate (Senokot-S) tablet 1 tablet (has no  administration in time range)  0.9 %  sodium chloride infusion ( Intravenous New Bag/Given 03/08/23 1630)  rosuvastatin (CRESTOR) tablet 40 mg (40 mg Oral Given 03/08/23 1742)    ED Course/ Medical Decision Making/ A&P                             Medical Decision Making Problems Addressed: Cerebrovascular accident (CVA), unspecified mechanism (HCC): complicated acute illness or injury  Amount and/or Complexity of Data Reviewed External Data Reviewed: notes. Labs: ordered. Radiology: ordered and independent interpretation performed. ECG/medicine tests: ordered and independent interpretation performed.  Risk Decision regarding hospitalization.   Patient is a 89 year old male with past medical history significant of atrial fibrillation/flutter Eliquis, CAD s/p CABG, diet-controlled diabetes mellitus type 2, PVD s/p right BKA , and CKD stage III, presenting today for right-sided weakness.  On exam, patient is alert and oriented.  He does have decreased strength of the right upper and lower extremities at 2/5 as compared to the left (5/5).  He also has decree sensation over the right upper and lower extremities.  Presentation most concerning for CVA.  Code stroke not alerted as symptoms have been ongoing for 72 hours at this point.  He does not have any fevers, nuchal rigidity, or changes in mental status to increase concern for meningitis or encephalitis.  He does not have any trauma to raise concern for spinal cord injury.  Will also evaluate for electrolyte derangements and UTI.  Lab work reveals mild hyponatremia at 130.  Hemoglobin is low at 11.  UA is unremarkable.  EKG reveals atrial fibrillation at 67 bpm without any acute ST or T wave abnormalities.  Initial head CT reveals no acute intracranial abnormalities.  Discussed case with neurology regarding concern for possible CVA 3 days ago, causing significant debilitation with weakness of his right sided extremities.  They have also  reviewed the CT imaging with concern for possible basal ganglia stroke.  Recommend admission for further evaluation and MRI.  MRI ordered.  Discussed case with hospitalist who will admit for right-sided weakness likely secondary to basal ganglia stroke.  He is hemodynamically stable and appropriate for transfer to the floor.  Final Clinical Impression(s) / ED Diagnoses Final diagnoses:  Cerebrovascular accident (CVA), unspecified mechanism [facility])    Rx / DC Orders ED Discharge Orders     None         Rhys Martini, DO 03/08/23 1748    Melene Plan, DO 03/08/23 1906

## 2023-03-08 NOTE — Consult Note (Cosign Needed)
Neurology Consultation  Reason for Consult: Right-sided weakness Referring Physician: Dr. Katrinka Blazing   CC: Weakness  History is obtained from:medical record and patient   HPI: Eric Rahlf. is a 89 y.o. male with past medical history of a flutter/fib on Eliquis, hyperlipidemia, hypertension, diabetes, CKD, CAD, OSA, PVD s/p BKA, normally ambulates with walker who presents to the emergency department evaluated for progressive right-sided weakness. He initially reported R sided weakness that started last Thursday/Friday about a week ago. Upon further questioning, reports that this has been going on for about a year. Gradually progressed over the last year. He is right handed and eats with his R hand and has noted progressive weakness and trouble with R hand. This acutely worsened over the last week. He also reports that R leg has been giving him trouble going as far back as 7 years. He had L leg below knee amputation about 5 years ago but has noted that even prior to that, his R leg was gradually getting weaker. He does endorse missing his morning dose of Eliquis yesterday.  He got MRI brain without contrast which was negative for an acute stroke.  LKW: unclear. IV thrombolysis given?: no, outside window EVT:  No LVO Premorbid modified Rankin scale (mRS):   3-Moderate disability-requires help but walks WITHOUT assistance  ROS: Full ROS was performed and is negative except as noted in the HPI.    Past Medical History:  Diagnosis Date   Atrial flutter (HCC)    s/p RFCA   CAD (coronary artery disease)    cath 2003, occluded S-RCA, occluded S-Dx, L-LAD ok, s/p PTCA to LAD   Cataract    CKD (chronic kidney disease) stage 3, GFR 30-59 ml/min (HCC)    Diabetes mellitus    diet controlled   Hearing aid worn    bilateral   History of kidney stones    Long term (current) use of anticoagulants    Neuromuscular disorder (HCC)    OSA (obstructive sleep apnea) 12/11   very mild, AHI 7/hr    Persistent atrial fibrillation (HCC) 09/26/2015   Pure hyperglyceridemia    PVD (peripheral vascular disease) (HCC)    angioplasty of his right lower extremity in [place] by Dr.[name] 2013   Unspecified essential hypertension    Wears dentures    full upper and lower     Family History  Problem Relation Age of Onset   Stroke Father    Aneurysm Father    Hypertension Father    Prostate cancer Brother    Hypertension Mother    Lung cancer Brother        smoker   Thrombosis Brother    Other Brother        RF valve disorder (smoker)   Lung cancer Brother        smoker   Breast cancer Sister    Liver cancer Brother    Other Sister        cerebral hemorrhage     Social History:   reports that he has never smoked. He quit smokeless tobacco use about 30 years ago. He reports that he does not drink alcohol and does not use drugs.  Medications  Current Facility-Administered Medications:    [START ON 03/09/2023]  stroke: early stages of recovery book, , Does not apply, Once, Smith, Rondell A, MD   0.9 %  sodium chloride infusion, , Intravenous, Continuous, Smith, Rondell A, MD, Last Rate: 75 mL/hr at 03/08/23 1630, New Bag at 03/08/23  1630   acetaminophen (TYLENOL) tablet 650 mg, 650 mg, Oral, Q4H PRN **OR** acetaminophen (TYLENOL) 160 MG/5ML solution 650 mg, 650 mg, Per Tube, Q4H PRN **OR** acetaminophen (TYLENOL) suppository 650 mg, 650 mg, Rectal, Q4H PRN, Smith, Rondell A, MD   rosuvastatin (CRESTOR) tablet 40 mg, 40 mg, Oral, Daily, Smith, Rondell A, MD   senna-docusate (Senokot-S) tablet 1 tablet, 1 tablet, Oral, QHS PRN, Clydie Braun, MD  Current Outpatient Medications:    acetaminophen (TYLENOL) 325 MG tablet, Take 2 tablets (650 mg total) by mouth every 6 (six) hours as needed for mild pain (or temp >/= 101 F)., Disp: , Rfl:    amLODipine (NORVASC) 10 MG tablet, Take 1 tablet (10 mg total) by mouth daily., Disp: 90 tablet, Rfl: 1   apixaban (ELIQUIS) 2.5 MG TABS tablet,  Take 1 tablet (2.5 mg total) by mouth 2 (two) times daily. OFFICE VISIT NEEDED FOR ADDITIONAL REFILLS, Disp: 180 tablet, Rfl: 1   atenolol (TENORMIN) 25 MG tablet, Take 1/2 tablet (12.5 mg total) by mouth daily., Disp: 15 tablet, Rfl: 3   bethanechol (URECHOLINE) 10 MG tablet, Take 0.5 tablets (5 mg total) by mouth 3 (three) times daily., Disp: 45 tablet, Rfl: 1   Cinnamon 500 MG TABS, Take 500 mg by mouth daily., Disp: , Rfl:    Cranberry 500 MG CAPS, Take 500 mg by mouth daily., Disp: , Rfl:    cyanocobalamin (VITAMIN B12) 500 MCG tablet, Take 500 mcg by mouth daily., Disp: , Rfl:    ferrous sulfate 325 (65 FE) MG EC tablet, Take 325 mg by mouth at bedtime., Disp: , Rfl:    finasteride (PROSCAR) 5 MG tablet, TAKE 1 TABLET BY MOUTH ONCE DAILY (Patient taking differently: Take 5 mg by mouth daily.), Disp: 90 tablet, Rfl: 3   fluticasone (FLONASE) 50 MCG/ACT nasal spray, Place 1 spray into both nostrils daily., Disp: , Rfl: 2   furosemide (LASIX) 40 MG tablet, Take 40 mg by mouth daily., Disp: , Rfl:    hydrALAZINE (APRESOLINE) 25 MG tablet, TAKE 3 TABLETS BY MOUTH IN THE MORNING AND AT BEDTIME (Patient taking differently: Take 75 mg by mouth 2 (two) times daily.), Disp: 180 tablet, Rfl: 2   Multiple Vitamin (DAILY MULTIVITAMIN PO), Take 1 tablet by mouth daily., Disp: , Rfl:    pentoxifylline (TRENTAL) 400 MG CR tablet, TAKE 1 TABLET BY MOUTH EVERY 12 HOURS . (Patient taking differently: Take 400 mg by mouth every 12 (twelve) hours. TAKE 1 TABLET BY MOUTH EVERY 12 HOURS .), Disp: 180 tablet, Rfl: 1   rosuvastatin (CRESTOR) 40 MG tablet, Take 1 tablet (40 mg total) by mouth daily., Disp: 90 tablet, Rfl: 1   tamsulosin (FLOMAX) 0.4 MG CAPS capsule, Take 2 capsules (0.8 mg total) by mouth daily after supper., Disp: 60 capsule, Rfl: 3   ONETOUCH ULTRA test strip, USE 1 STRIP TO CHECK GLUCOSE ONCE DAILY (Patient taking differently: 1 each by Other route See admin instructions. USE 1 STRIP TO CHECK GLUCOSE  ONCE DAILY), Disp: 50 each, Rfl: 0   temazepam (RESTORIL) 7.5 MG capsule, Take 1 capsule (7.5 mg total) by mouth at bedtime as needed for sleep. (Patient not taking: Reported on 03/08/2023), Disp: 15 capsule, Rfl: 0   Exam: Current vital signs: BP (!) 147/89 (BP Location: Right Arm)   Pulse 72   Temp 98.3 F (36.8 C) (Oral)   Resp 16   Ht 5\' 5"  (1.651 m)   Wt 72.6 kg   SpO2 98%  BMI 26.63 kg/m  Vital signs in last 24 hours: Temp:  [97.6 F (36.4 C)-98.3 F (36.8 C)] 98.3 F (36.8 C) (07/29 1529) Pulse Rate:  [53-74] 72 (07/29 1529) Resp:  [14-18] 16 (07/29 1529) BP: (147-164)/(66-99) 147/89 (07/29 1529) SpO2:  [97 %-99 %] 98 % (07/29 1529) Weight:  [72.6 kg] 72.6 kg (07/29 1036)  GENERAL: Awake, alert in NAD HEENT: - Normocephalic and atraumatic, dry mm LUNGS - Clear to auscultation bilaterally with no wheezes CV - S1S2 RRR, no m/r/g, equal pulses bilaterally. ABDOMEN - Soft, nontender, nondistended with normoactive BS Ext: warm, well perfused, intact peripheral pulses, no edema, Left BKA  NEURO:  Mental Status: AA&Ox4. HOH follows commands  Language: speech is clear. HOH.  Naming, repetition, fluency, and comprehension intact. Cranial Nerves: PERRL EOMI, visual fields full, slight right facial asymmetry, facial sensation intact, hearing intact, tongue/uvula/soft palate midline, normal sternocleidomastoid and trapezius muscle strength. No evidence of tongue atrophy or fibrillations Motor: Left arm 5/5, right arm 3/5, left BKA, right leg 2/5 in R hip flexion and Knee extension. 3/5 in R foot dorsiflexion and R foot plantarflexion. Tone: is normal and bulk is poor with some muscle wasting and atrophy of thenar eminence more pronounced on the right than left. He also has notable fasciculations in R hand. Sensation- Intact to light touch bilaterally Coordination: FTN intact on the left, unable to do with RUE, no ataxia in BLE. Gait- deferred  NIHSS 1a Level of Conscious.:  0 1b LOC Questions: 0 1c LOC Commands: 0 2 Best Gaze: 0 3 Visual: 0 4 Facial Palsy: 1 5a Motor Arm - left: 0 5b Motor Arm - Right: 3 6a Motor Leg - Left: 0 6b Motor Leg - Right: 3 7 Limb Ataxia: 0 8 Sensory: 0 9 Best Language: 0 10 Dysarthria: 0 11 Extinct. and Inatten.: 0 TOTAL: 7   Labs I have reviewed labs in epic and the results pertinent to this consultation are:  CBC    Component Value Date/Time   WBC 6.4 03/08/2023 1051   RBC 3.89 (L) 03/08/2023 1051   HGB 11.6 (L) 03/08/2023 1137   HGB 11.7 (L) 09/28/2022 1024   HCT 34.0 (L) 03/08/2023 1137   HCT 35.2 (L) 09/28/2022 1024   PLT 107 (L) 03/08/2023 1051   PLT 216 09/28/2022 1024   MCV 90.0 03/08/2023 1051   MCV 89 09/28/2022 1024   MCH 28.3 03/08/2023 1051   MCHC 31.4 03/08/2023 1051   RDW 17.2 (H) 03/08/2023 1051   RDW 14.6 09/28/2022 1024   LYMPHSABS 2.6 03/08/2023 1051   MONOABS 0.9 03/08/2023 1051   EOSABS 0.1 03/08/2023 1051   BASOSABS 0.1 03/08/2023 1051    CMP     Component Value Date/Time   NA 135 03/08/2023 1137   NA 139 10/05/2022 1412   NA 139 04/28/2012 0658   K 3.6 03/08/2023 1137   K 4.2 04/28/2012 0658   CL 100 03/08/2023 1137   CL 106 04/28/2012 0658   CO2 22 03/08/2023 1050   CO2 25 04/28/2012 0658   GLUCOSE 108 (H) 03/08/2023 1137   GLUCOSE 114 (H) 04/28/2012 0658   BUN 16 03/08/2023 1137   BUN 24 10/05/2022 1412   BUN 32 (H) 04/28/2012 0658   CREATININE 1.20 03/08/2023 1137   CREATININE 1.23 (H) 12/28/2022 1648   CALCIUM 8.4 (L) 03/08/2023 1050   CALCIUM 8.9 04/28/2012 0658   PROT 6.1 (L) 03/08/2023 1050   ALBUMIN 3.0 (L) 03/08/2023 1050   AST 56 (  H) 03/08/2023 1050   ALT 32 03/08/2023 1050   ALKPHOS 53 03/08/2023 1050   BILITOT 0.7 03/08/2023 1050   GFRNONAA 57 (L) 03/08/2023 1050   GFRNONAA 39 (L) 12/31/2020 0833   GFRAA 45 (L) 12/31/2020 0833    Lipid Panel     Component Value Date/Time   CHOL 88 03/08/2023 1049   CHOL 83 (L) 12/09/2020 1140   TRIG 80  03/08/2023 1049   HDL 33 (L) 03/08/2023 1049   HDL 30 (L) 12/09/2020 1140   CHOLHDL 2.7 03/08/2023 1049   VLDL 16 03/08/2023 1049   LDLCALC 39 03/08/2023 1049   LDLCALC 44 11/16/2022 1113   LDLDIRECT 99.1 02/19/2011 0823    Lab Results  Component Value Date   HGBA1C 5.8 (H) 11/16/2022      Imaging I have reviewed the images obtained:  CT-head no acute process  LDL 39  Assessment:  89 y.o. male with past medical history of a flutter/fib on Eliquis, hyperlipidemia, hypertension, diabetes, CKD, CAD, OSA, PVD s/p BKA, normally ambulates with walker who presents to the emergency department evaluated for progressive right-sided weakness over the last year in RUE and last several years in RLE. This acutely worsened over the last week and now difficulty with walking.  Impression: Concern for motor neuron disease vs intraspinal tumors vs other lesions.  Recommendations: - MRI C spine. - if MRI c spine is negative, will need outpatient EMG/NCS.  Gevena Mart DNP, ACNPC-AG  [facility]   NEUROHOSPITALIST ADDENDUM Performed a face to face diagnostic evaluation.   I have reviewed the contents of history and physical exam as documented by PA/ARNP/Resident and agree with above documentation.  I have discussed and formulated the above plan as documented. Edits to the note have been made as needed.  Erick Blinks, MD [facility] [phone]   If 7pm to 7am, please call on call as listed on AMION.

## 2023-03-08 NOTE — ED Notes (Signed)
ED TO INPATIENT HANDOFF REPORT  ED Nurse Name and Phone #: Francene Castle, RN [phone]  S Name/Age/Gender Eric [name]. 89 y.o. male Room/Bed: 009C/009C  Code Status   Code Status: Full Code  Home/SNF/Other Home Patient oriented to: self, place, and situation Is this baseline? Yes   Triage Complete: Triage complete  Chief Complaint CVA (cerebral vascular accident) [facility]) [I63.9]  Triage Note Pt was having some right sided weakness starting Friday. Pt did not report this to family until Sunday  . Pt reports that today it started getting worse.  Ems vitals Hr 65 Spo2 96% cbg 159 Bp 150/60     Allergies Allergies  Allergen Reactions   Altace [Ramipril] Cough   Isosorbide Mononitrate Other (See Comments)    Unknown reaction   Quinidine Gluconate Rash    Level of Care/Admitting Diagnosis ED Disposition     ED Disposition  Admit   Condition  --   Comment  Hospital Area: [facility] [[id number]]  Level of Care: Telemetry Medical [104]  May place patient in observation at [facility] or [facility] if equivalent level of care is available:: No  Covid Evaluation: Asymptomatic - no recent exposure (last 10 days) testing not required  Diagnosis: CVA (cerebral vascular accident) [facility]) [[id number]]  Admitting Physician: Clydie Braun [[id number]]  Attending Physician: Clydie Braun [[id number]]          B Medical/Surgery History Past Medical History:  Diagnosis Date   Atrial flutter (HCC)    s/p RFCA   CAD (coronary artery disease)    cath 2003, occluded S-RCA, occluded S-Dx, L-LAD ok, s/p PTCA to LAD   Cataract    CKD (chronic kidney disease) stage 3, GFR 30-59 ml/min (HCC)    Diabetes mellitus    diet controlled   Hearing aid worn    bilateral   History of kidney stones    Long term (current) use of anticoagulants    Neuromuscular disorder (HCC)    OSA (obstructive sleep apnea) 12/11   very mild, AHI 7/hr   Persistent atrial  fibrillation (HCC) 09/26/2015   Pure hyperglyceridemia    PVD (peripheral vascular disease) (HCC)    angioplasty of his right lower extremity in [place] by Dr.[name] 2013   Unspecified essential hypertension    Wears dentures    full upper and lower   Past Surgical History:  Procedure Laterality Date   ABDOMINAL AORTOGRAM W/LOWER EXTREMITY N/A 11/15/2017   Procedure: ABDOMINAL AORTOGRAM W/LOWER EXTREMITY;  Surgeon: Sherren Kerns, MD;  [facility] INVASIVE CV LAB;  Service: Cardiovascular;  Laterality: N/A;   Adenosine Myoview  3/06   EF 56%, neg. Ischemia   Adenosine Myoview  02/18/07   nml   AMPUTATION Left 12/15/2017   Procedure: AMPUTATION BELOW KNEE;  Surgeon: Annice Needy, MD;  Location: [facility] ORS;  Service: Vascular;  Laterality: Left;   ANGIOPLASTY  1/99   CAD- diogonal with rotational artherectomy   Arthrectomy     of LAD & PTCA   BLEPHAROPLASTY Bilateral    CARDIAC CATHETERIZATION  1/00   CARDIOVERSION  1/04   CARDIOVERSION  5/07   hospital- a flutter   CARPAL TUNNEL RELEASE     ? bilateral   CATARACT EXTRACTION W/PHACO Right 07/28/2017   Procedure: CATARACT EXTRACTION PHACO AND INTRAOCULAR LENS PLACEMENT (IOC) RIGHT DIABETIC;  Surgeon: Lockie Mola, MD;  Location: [facility];  Service: Ophthalmology;  Laterality: Right;  Diabetic - diet controlled   CATARACT EXTRACTION  W/PHACO Left 08/18/2017   Procedure: CATARACT EXTRACTION PHACO AND INTRAOCULAR LENS PLACEMENT (IOC);  Surgeon: Lockie Mola, MD;  Location: [facility];  Service: Ophthalmology;  Laterality: Left;  DIABETES - oral meds   COLONOSCOPY W/ BIOPSIES  10/01/06   sigmoid polyp bx neg, 3 years   COLONOSCOPY WITH PROPOFOL N/A 04/28/2021   Procedure: COLONOSCOPY WITH PROPOFOL;  Surgeon: Iva Boop, MD;  Location: [facility] ENDOSCOPY;  Service: Endoscopy;  Laterality: N/A;   CORONARY ANGIOPLASTY  4/03   cutting balloon PTCA pLAD into Diag   CORONARY ARTERY BYPASS GRAFT  2000   LIMA-LAD,  SVG-RCA, SVG-Diag; SVG-Diag & SVG-RCA occluded 2003   FRACTURE SURGERY     HAND SURGERY  08/27/09   R thumb procedure wit Scaphoid Gragt and screws, Dr Merlyn Lot   LOWER EXTREMITY ANGIOGRAPHY Right 01/25/2019   Procedure: LOWER EXTREMITY ANGIOGRAPHY;  Surgeon: Renford Dills, MD;  Location: [facility] INVASIVE CV LAB;  Service: Cardiovascular;  Laterality: Right;   NM MYOVIEW LTD  4/11   normal   PERIPHERAL VASCULAR CATHETERIZATION Right 06/27/2015   Procedure: Lower Extremity Angiography;  Surgeon: Annice Needy, MD;  Location: [facility] INVASIVE CV LAB;  Service: Cardiovascular;  Laterality: Right;   PERIPHERAL VASCULAR CATHETERIZATION  06/27/2015   Procedure: Lower Extremity Intervention;  Surgeon: Annice Needy, MD;  Location: [facility] INVASIVE CV LAB;  Service: Cardiovascular;;   POLYPECTOMY  04/28/2021   Procedure: POLYPECTOMY;  Surgeon: Iva Boop, MD;  Location: [facility] ENDOSCOPY;  Service: Endoscopy;;     A IV Location/Drains/Wounds Patient Lines/Drains/Airways Status     Active Line/Drains/Airways     Name Placement date Placement time Site Days   Peripheral IV 03/08/23 20 G Left Antecubital 03/08/23  1031  Antecubital  less than 1            Intake/Output Last 24 hours  Intake/Output Summary (Last 24 hours) at 03/08/2023 2134 Last data filed at 03/08/2023 2129 Gross per 24 hour  Intake 161.99 ml  Output --  Net 161.99 ml    Labs/Imaging Results for orders placed or performed during the hospital encounter of 03/08/23 (from the past 48 hour(s))  CBG monitoring, ED     Status: Abnormal   Collection Time: 03/08/23 10:45 AM  Result Value Ref Range   Glucose-Capillary 119 (H) 70 - 99 mg/dL    Comment: Glucose reference range applies only to samples taken after fasting for at least 8 hours.  Lipid panel     Status: Abnormal   Collection Time: 03/08/23 10:49 AM  Result Value Ref Range   Cholesterol 88 0 - 200 mg/dL   Triglycerides 80 <161 mg/dL   HDL 33 (L) >09 mg/dL   Total CHOL/HDL  Ratio 2.7 RATIO   VLDL 16 0 - 40 mg/dL   LDL Cholesterol 39 0 - 99 mg/dL    Comment:        Total Cholesterol/HDL:CHD Risk Coronary Heart Disease Risk Table                     Men   Women  1/2 Average Risk   3.4   3.3  Average Risk       5.0   4.4  2 X Average Risk   9.6   7.1  3 X Average Risk  23.4   11.0        Use the calculated Patient Ratio above and the CHD Risk Table to determine the patient's CHD Risk.  ATP III CLASSIFICATION (LDL):  <100     mg/dL   Optimal  161-096  mg/dL   Near or Above                    Optimal  130-159  mg/dL   Borderline  045-409  mg/dL   High  >811     mg/dL   Very High Performed at [facility] Lab, [address], [place], [place] [address]   Ethanol     Status: None   Collection Time: 03/08/23 10:50 AM  Result Value Ref Range   Alcohol, Ethyl (B) <10 <10 mg/dL    Comment: (NOTE) Lowest detectable limit for serum alcohol is 10 mg/dL.  For medical purposes only. Performed at [facility] Lab, [address], [place], [place] [address]   Protime-INR     Status: Abnormal   Collection Time: 03/08/23 10:50 AM  Result Value Ref Range   Prothrombin Time 18.1 (H) 11.4 - 15.2 seconds   INR 1.5 (H) 0.8 - 1.2    Comment: (NOTE) INR goal varies based on device and disease states. Performed at [facility] Lab, [address], [place], [place] [address]   APTT     Status: Abnormal   Collection Time: 03/08/23 10:50 AM  Result Value Ref Range   aPTT 38 (H) 24 - 36 seconds    Comment:        IF BASELINE aPTT IS ELEVATED, SUGGEST PATIENT RISK ASSESSMENT BE USED TO DETERMINE APPROPRIATE ANTICOAGULANT THERAPY. Performed at [facility], [address], [place], [place] [address]   Comprehensive metabolic panel     Status: Abnormal   Collection Time: 03/08/23 10:50 AM  Result Value Ref Range   Sodium 130 (L) 135 - 145 mmol/L   Potassium 3.7 3.5 - 5.1 mmol/L   Chloride 99 98 - 111 mmol/L   CO2 22 22 - 32 mmol/L    Glucose, Bld 106 (H) 70 - 99 mg/dL    Comment: Glucose reference range applies only to samples taken after fasting for at least 8 hours.   BUN 16 8 - 23 mg/dL   Creatinine, Ser 9.62 0.61 - 1.24 mg/dL   Calcium 8.4 (L) 8.9 - 10.3 mg/dL   Total Protein 6.1 (L) 6.5 - 8.1 g/dL   Albumin 3.0 (L) 3.5 - 5.0 g/dL   AST 56 (H) 15 - 41 U/L   ALT 32 0 - 44 U/L   Alkaline Phosphatase 53 38 - 126 U/L   Total Bilirubin 0.7 0.3 - 1.2 mg/dL   GFR, Estimated 57 (L) >60 mL/min    Comment: (NOTE) Calculated using the CKD-EPI Creatinine Equation (2021)    Anion gap 9 5 - 15    Comment: Performed at [facility], [address], [place], [place] [address]  CBC     Status: Abnormal   Collection Time: 03/08/23 10:51 AM  Result Value Ref Range   WBC 6.4 4.0 - 10.5 K/uL   RBC 3.89 (L) 4.22 - 5.81 MIL/uL   Hemoglobin 11.0 (L) 13.0 - 17.0 g/dL   HCT 13.2 (L) 44.0 - 10.2 %   MCV 90.0 80.0 - 100.0 fL   MCH 28.3 26.0 - 34.0 pg   MCHC 31.4 30.0 - 36.0 g/dL   RDW 72.5 (H) 36.6 - 44.0 %   Platelets 107 (L) 150 - 400 K/uL    Comment: REPEATED TO VERIFY   nRBC 0.0 0.0 -  0.2 %    Comment: Performed at [facility] Lab, [address], [place], [place] [address]  Differential     Status: None   Collection Time: 03/08/23 10:51 AM  Result Value Ref Range   Neutrophils Relative % 43 %   Neutro Abs 2.7 1.7 - 7.7 K/uL   Lymphocytes Relative 40 %   Lymphs Abs 2.6 0.7 - 4.0 K/uL   Monocytes Relative 14 %   Monocytes Absolute 0.9 0.1 - 1.0 K/uL   Eosinophils Relative 2 %   Eosinophils Absolute 0.1 0.0 - 0.5 K/uL   Basophils Relative 1 %   Basophils Absolute 0.1 0.0 - 0.1 K/uL   Immature Granulocytes 0 %   Abs Immature Granulocytes 0.02 0.00 - 0.07 K/uL    Comment: Performed at [facility], [address] [place], [place] [address]  I-stat chem 8, ED     Status: Abnormal   Collection Time: 03/08/23 11:37 AM  Result Value Ref Range   Sodium 135 135 - 145 mmol/L   Potassium 3.6 3.5 - 5.1 mmol/L    Chloride 100 98 - 111 mmol/L   BUN 16 8 - 23 mg/dL   Creatinine, Ser 6.06 0.61 - 1.24 mg/dL   Glucose, Bld 301 (H) 70 - 99 mg/dL    Comment: Glucose reference range applies only to samples taken after fasting for at least 8 hours.   Calcium, Ion 1.17 1.15 - 1.40 mmol/L   TCO2 24 22 - 32 mmol/L   Hemoglobin 11.6 (L) 13.0 - 17.0 g/dL   HCT 60.1 (L) 09.3 - 23.5 %  Urine rapid drug screen (hosp performed)     Status: None   Collection Time: 03/08/23  1:35 PM  Result Value Ref Range   Opiates NONE DETECTED NONE DETECTED   Cocaine NONE DETECTED NONE DETECTED   Benzodiazepines NONE DETECTED NONE DETECTED   Amphetamines NONE DETECTED NONE DETECTED   Tetrahydrocannabinol NONE DETECTED NONE DETECTED   Barbiturates NONE DETECTED NONE DETECTED    Comment: (NOTE) DRUG SCREEN FOR MEDICAL PURPOSES ONLY.  IF CONFIRMATION IS NEEDED FOR ANY PURPOSE, NOTIFY LAB WITHIN 5 DAYS.  LOWEST DETECTABLE LIMITS FOR URINE DRUG SCREEN Drug Class                     Cutoff (ng/mL) Amphetamine and metabolites    1000 Barbiturate and metabolites    200 Benzodiazepine                 200 Opiates and metabolites        300 Cocaine and metabolites        300 THC                            50 Performed at [facility] Lab, [address] [place], [place] [address]   Urinalysis, Routine w reflex microscopic -Urine, Clean Catch     Status: Abnormal   Collection Time: 03/08/23  1:36 PM  Result Value Ref Range   Color, Urine YELLOW YELLOW   APPearance CLEAR CLEAR   Specific Gravity, Urine 1.010 1.005 - 1.030   pH 6.0 5.0 - 8.0   Glucose, UA NEGATIVE NEGATIVE mg/dL   Hgb urine dipstick NEGATIVE NEGATIVE   Bilirubin Urine NEGATIVE NEGATIVE   Ketones, ur NEGATIVE NEGATIVE mg/dL   Protein, ur 025 (A) NEGATIVE mg/dL   Nitrite NEGATIVE NEGATIVE   Leukocytes,Ua NEGATIVE NEGATIVE  Comment: Performed at [facility], [address], [place], [place] [address]  Urinalysis, Microscopic (reflex)      Status: Abnormal   Collection Time: 03/08/23  1:36 PM  Result Value Ref Range   RBC / HPF 0-5 0 - 5 RBC/hpf   WBC, UA 0-5 0 - 5 WBC/hpf   Bacteria, UA RARE (A) NONE SEEN   Squamous Epithelial / HPF 0-5 0 - 5 /HPF   Mucus PRESENT     Comment: Performed at [facility], [address], [place], [place] [address]   *Note: Due to a large number of results and/or encounters for the requested time period, some results have not been displayed. A complete set of results can be found in Results Review.   MR BRAIN WO CONTRAST  Result Date: 03/08/2023 CLINICAL DATA:  Right lower extremity weakness EXAM: MRI HEAD WITHOUT CONTRAST TECHNIQUE: Multiplanar, multiecho pulse sequences of the brain and surrounding structures were obtained without intravenous contrast. COMPARISON:  None Available. FINDINGS: Brain: No acute infarct, mass effect or extra-axial collection. No acute or chronic hemorrhage. There is multifocal hyperintense T2-weighted signal within the white matter. Generalized volume loss. The midline structures are normal. Vascular: Occluded right vertebral artery. Skull and upper cervical spine: Normal calvarium and skull base. Visualized upper cervical spine and soft tissues are normal. Sinuses/Orbits:No paranasal sinus fluid levels or advanced mucosal thickening. No mastoid or middle ear effusion. Normal orbits. IMPRESSION: 1. No acute intracranial abnormality. 2. Occluded right vertebral artery. 3. Findings of chronic small vessel ischemia and volume loss. Electronically Signed   By: Deatra Robinson M.D.   On: 03/08/2023 20:22   CT ANGIO HEAD NECK W WO CM  Result Date: 03/08/2023 CLINICAL DATA:  Right-sided weakness EXAM: CT ANGIOGRAPHY HEAD AND NECK WITH AND WITHOUT CONTRAST TECHNIQUE: Multidetector CT imaging of the head and neck was performed using the standard protocol during bolus administration of intravenous contrast. Multiplanar CT image reconstructions and MIPs were obtained to evaluate  the vascular anatomy. Carotid stenosis measurements (when applicable) are obtained utilizing NASCET criteria, using the distal internal carotid diameter as the denominator. RADIATION DOSE REDUCTION: This exam was performed according to the departmental dose-optimization program which includes automated exposure control, adjustment of the mA and/or kV according to patient size and/or use of iterative reconstruction technique. CONTRAST:  75mL OMNIPAQUE IOHEXOL 350 MG/ML SOLN COMPARISON:  None Available. FINDINGS: CTA NECK FINDINGS SKELETON: There is no bony spinal canal stenosis. No lytic or blastic lesion. OTHER NECK: Normal pharynx, larynx and major salivary glands. No cervical lymphadenopathy. Unremarkable thyroid gland. UPPER CHEST: No pneumothorax or pleural effusion. No nodules or masses. AORTIC ARCH: There is calcific atherosclerosis of the aortic arch. Conventional 3 vessel aortic branching pattern. RIGHT CAROTID SYSTEM: Qualitatively severe stenosis of the proximal right ICA due to mixed density atherosclerosis. By NASCET criteria this measures approximately 80%. LEFT CAROTID SYSTEM: Atherosclerotic calcification causes moderate stenosis of the distal common carotid artery. No stenosis of the internal carotid artery. VERTEBRAL ARTERIES: Left dominant configuration.The right vertebral artery is occluded along its entire course. The left vertebral artery is normal. CTA HEAD FINDINGS POSTERIOR CIRCULATION: --Vertebral arteries: Occluded right V4 segment. Bulky atherosclerosis with severe stenosis of the left V4 segment. --Inferior cerebellar arteries: Normal left. Right not clearly visualized. --Basilar artery: Mild atherosclerotic calcification without stenosis or occlusion. --Superior cerebellar arteries: Normal. --Posterior cerebral arteries (PCA): Normal. ANTERIOR CIRCULATION: --Intracranial internal carotid arteries: Bilateral atherosclerotic calcification causing moderate stenosis of the distal right  cavernous  segment. --Anterior cerebral arteries (ACA): Patent --Middle cerebral arteries (MCA): Normal. VENOUS SINUSES: As permitted by contrast timing, patent. ANATOMIC VARIANTS: None Review of the MIP images confirms the above findings. IMPRESSION: 1. No intracranial large vessel occlusion. 2. Qualitatively severe stenosis of the proximal right ICA due to mixed density atherosclerosis. 3. Occlusion of the right vertebral artery along its entire course, age indeterminate. 4. Severe stenosis of the left vertebral artery V4 segment. 5. Moderate stenosis of the distal right ICA cavernous segment. Aortic Atherosclerosis (ICD10-I70.0). Electronically Signed   By: Deatra Robinson M.D.   On: 03/08/2023 20:17   ECHOCARDIOGRAM COMPLETE  Result Date: 03/08/2023    ECHOCARDIOGRAM REPORT   Patient Name:   Eric [name]. Date of Exam: 03/08/2023 Medical Rec #:  [id number]           Height:       65.0 in Accession #:    [id number]          Weight:       160.0 lb Date of Birth:  [birth year]           BSA:          1.799 m Patient Age:    89 years            BP:           147/89 mmHg Patient Gender: M                   HR:           53 bpm. Exam Location:  Inpatient Procedure: 2D Echo, Cardiac Doppler and Color Doppler Indications:    Stroke I63.9  History:        Patient has prior history of Echocardiogram examinations, most                 recent 03/04/2021. CAD, PAD, Stroke and CKD, Arrythmias:Atrial                 Fibrillation; Risk Factors:Dyslipidemia, Diabetes, Non-Smoker,                 Hypertension and Sleep Apnea.  Sonographer:    Dondra Prader RVT RCS Referring Phys: [phone] RONDELL A SMITH IMPRESSIONS  1. Left ventricular ejection fraction, by estimation, is 55 to 60%. The left ventricle has normal function. The left ventricle has no regional wall motion abnormalities. There is moderate concentric left ventricular hypertrophy. Left ventricular diastolic parameters are consistent with Grade III diastolic dysfunction  (restrictive).  2. Right ventricular systolic function is normal. The right ventricular size is moderately enlarged. There is mildly elevated pulmonary artery systolic pressure. The estimated right ventricular systolic pressure is 42.0 mmHg.  3. Left atrial size was severely dilated.  4. Right atrial size was severely dilated.  5. The mitral valve is grossly normal. Trivial mitral valve regurgitation. Moderate mitral annular calcification.  6. Tricuspid valve regurgitation is moderate.  7. The aortic valve is calcified. There is severe calcifcation of the aortic valve. There is moderate thickening of the aortic valve. Aortic valve regurgitation is mild. Aortic valve mean gradient measures 9.0 mmHg. Aortic valve Vmax measures 2.06 m/s.  8. The inferior vena cava is normal in size with greater than 50% respiratory variability, suggesting right atrial pressure of 3 mmHg. FINDINGS  Left Ventricle: Left ventricular ejection fraction, by estimation, is 55 to 60%. The left ventricle has normal function. The left ventricle has no regional wall motion abnormalities. The left ventricular internal  cavity size was normal in size. There is  moderate concentric left ventricular hypertrophy. Left ventricular diastolic function could not be evaluated due to atrial fibrillation. Left ventricular diastolic parameters are consistent with Grade III diastolic dysfunction (restrictive). Right Ventricle: The right ventricular size is moderately enlarged. No increase in right ventricular wall thickness. Right ventricular systolic function is normal. There is mildly elevated pulmonary artery systolic pressure. The tricuspid regurgitant velocity is 3.04 m/s, and with an assumed right atrial pressure of 5 mmHg, the estimated right ventricular systolic pressure is 42.0 mmHg. Left Atrium: Left atrial size was severely dilated. Right Atrium: Right atrial size was severely dilated. Pericardium: There is no evidence of pericardial effusion. Mitral  Valve: The mitral valve is grossly normal. There is mild calcification of the mitral valve leaflet(s). Moderate mitral annular calcification. Trivial mitral valve regurgitation. Tricuspid Valve: The tricuspid valve is normal in structure. Tricuspid valve regurgitation is moderate. Aortic Valve: The aortic valve is calcified. There is severe calcifcation of the aortic valve. There is moderate thickening of the aortic valve. There is moderate aortic valve annular calcification. Aortic valve regurgitation is mild. Aortic valve mean gradient measures 9.0 mmHg. Aortic valve peak gradient measures 16.9 mmHg. Aortic valve area, by VTI measures 2.27 cm. Pulmonic Valve: The pulmonic valve was normal in structure. Pulmonic valve regurgitation is trivial. Aorta: The aortic root and ascending aorta are structurally normal, with no evidence of dilitation. Venous: The inferior vena cava is normal in size with greater than 50% respiratory variability, suggesting right atrial pressure of 3 mmHg. IAS/Shunts: No atrial level shunt detected by color flow Doppler.  LEFT VENTRICLE PLAX 2D LVIDd:         4.70 cm   Diastology LVIDs:         3.20 cm   LV e' medial:    7.40 cm/s LV PW:         1.20 cm   LV E/e' medial:  17.7 LV IVS:        1.30 cm   LV e' lateral:   13.95 cm/s LVOT diam:     1.90 cm   LV E/e' lateral: 9.4 LV SV:         96 LV SV Index:   53 LVOT Area:     2.84 cm  RIGHT VENTRICLE             IVC RV Basal diam:  4.20 cm     IVC diam: 1.60 cm RV S prime:     13.50 cm/s TAPSE (M-mode): 1.9 cm LEFT ATRIUM              Index        RIGHT ATRIUM           Index LA diam:        5.40 cm  3.00 cm/m   RA Area:     38.20 cm LA Vol (A2C):   155.0 ml 86.15 ml/m  RA Volume:   149.00 ml 82.82 ml/m LA Vol (A4C):   130.0 ml 72.26 ml/m LA Biplane Vol: 157.0 ml 87.26 ml/m  AORTIC VALVE                     PULMONIC VALVE AV Area (Vmax):    2.19 cm      PV Vmax:       1.13 m/s AV Area (Vmean):   2.15 cm      PV Peak grad:  5.1 mmHg AV  Area (  VTI):     2.27 cm AV Vmax:           205.67 cm/s AV Vmean:          140.000 cm/s AV VTI:            0.422 m AV Peak Grad:      16.9 mmHg AV Mean Grad:      9.0 mmHg LVOT Vmax:         159.00 cm/s LVOT Vmean:        106.333 cm/s LVOT VTI:          0.337 m LVOT/AV VTI ratio: 0.80 AR Vena Contracta: 0.80 cm  AORTA Ao Root diam: 3.60 cm Ao Asc diam:  3.60 cm MITRAL VALVE                TRICUSPID VALVE MV Area (PHT): 4.43 cm     TR Peak grad:   37.0 mmHg MV Decel Time: 171 msec     TR Vmax:        304.00 cm/s MV E velocity: 130.67 cm/s MV A velocity: 46.00 cm/s   SHUNTS MV E/A ratio:  2.84         Systemic VTI:  0.34 m                             Systemic Diam: 1.90 cm Aditya Sabharwal Electronically signed by Dorthula Nettles Signature Date/Time: 03/08/2023/5:39:27 PM    Final    CT HEAD WO CONTRAST  Result Date: 03/08/2023 CLINICAL DATA:  Stroke suspected EXAM: CT HEAD WITHOUT CONTRAST TECHNIQUE: Contiguous axial images were obtained from the base of the skull through the vertex without intravenous contrast. RADIATION DOSE REDUCTION: This exam was performed according to the departmental dose-optimization program which includes automated exposure control, adjustment of the mA and/or kV according to patient size and/or use of iterative reconstruction technique. COMPARISON:  None Available. FINDINGS: Brain: No evidence of acute infarction, hemorrhage, hydrocephalus, extra-axial collection or mass lesion/mass effect. Periventricular white matter hypodensity. Vascular: No hyperdense vessel or unexpected calcification. Skull: Normal. Negative for fracture or focal lesion. Sinuses/Orbits: No acute finding. Other: None. IMPRESSION: No acute intracranial pathology. Small-vessel white matter disease. MRI may be used to more sensitively evaluate for acute infarction if clinically suspected. Electronically Signed   By: Jearld Lesch M.D.   On: 03/08/2023 12:47    Pending Labs Unresulted Labs (From admission, onward)      Start     Ordered   03/09/23 0500  CBC  Tomorrow morning,   R        03/08/23 1435   03/09/23 0500  Comprehensive metabolic panel  Tomorrow morning,   R        03/08/23 1435   03/08/23 1404  Hemoglobin A1c  (Labs)  Add-on,   AD       Comments: To assess prior glycemic control    03/08/23 1407            Vitals/Pain Today's Vitals   03/08/23 1800 03/08/23 1830 03/08/23 2030 03/08/23 2118  BP: (!) 160/83 (!) 157/60  (!) 138/90  Pulse: 73 72  77  Resp: (!) 22 19  17   Temp:   98 F (36.7 C)   TempSrc:   Oral   SpO2: 97% 96%  97%  Weight:      Height:      PainSc:        Isolation Precautions  No active isolations  Medications Medications   stroke: early stages of recovery book (has no administration in time range)  acetaminophen (TYLENOL) tablet 650 mg (has no administration in time range)    Or  acetaminophen (TYLENOL) 160 MG/5ML solution 650 mg (has no administration in time range)    Or  acetaminophen (TYLENOL) suppository 650 mg (has no administration in time range)  senna-docusate (Senokot-S) tablet 1 tablet (has no administration in time range)  0.9 %  sodium chloride infusion (0 mLs Intravenous Stopped 03/08/23 2129)  rosuvastatin (CRESTOR) tablet 40 mg (40 mg Oral Given 03/08/23 1742)  atenolol (TENORMIN) tablet 12.5 mg (has no administration in time range)  amLODipine (NORVASC) tablet 10 mg (has no administration in time range)  hydrALAZINE (APRESOLINE) tablet 75 mg (has no administration in time range)  finasteride (PROSCAR) tablet 5 mg (has no administration in time range)  tamsulosin (FLOMAX) capsule 0.8 mg (0.8 mg Oral Given 03/08/23 2126)  pentoxifylline (TRENTAL) CR tablet 400 mg (has no administration in time range)  fluticasone (FLONASE) 50 MCG/ACT nasal spray 1 spray (has no administration in time range)  iohexol (OMNIPAQUE) 350 MG/ML injection 75 mL (75 mLs Intravenous Contrast Given 03/08/23 2000)    Mobility walks with device, uses a  prosthetic lower left leg and a cane at home normally.     Focused Assessments  Last NIHSS of 7.   R Recommendations: See Admitting Provider Note  Report given to:   Additional Notes:

## 2023-03-08 NOTE — ED Triage Notes (Signed)
Pt was having some right sided weakness starting Friday. Pt did not report this to family until Sunday  . Pt reports that today it started getting worse.  Ems vitals Hr 65 Spo2 96% cbg 159 Bp 150/60

## 2023-03-08 NOTE — Progress Notes (Signed)
*  PRELIMINARY RESULTS* Echocardiogram 2D Echocardiogram has been performed.  Eric [name] 03/08/2023, 5:34 PM

## 2023-03-09 ENCOUNTER — Observation Stay (HOSPITAL_COMMUNITY): Payer: PPO

## 2023-03-09 ENCOUNTER — Other Ambulatory Visit: Payer: Self-pay | Admitting: Family Medicine

## 2023-03-09 DIAGNOSIS — G8193 Hemiplegia, unspecified affecting right nondominant side: Secondary | ICD-10-CM | POA: Diagnosis not present

## 2023-03-09 DIAGNOSIS — E871 Hypo-osmolality and hyponatremia: Secondary | ICD-10-CM | POA: Diagnosis not present

## 2023-03-09 DIAGNOSIS — I739 Peripheral vascular disease, unspecified: Secondary | ICD-10-CM | POA: Diagnosis not present

## 2023-03-09 DIAGNOSIS — G8252 Quadriplegia, C1-C4 incomplete: Secondary | ICD-10-CM | POA: Diagnosis not present

## 2023-03-09 DIAGNOSIS — I129 Hypertensive chronic kidney disease with stage 1 through stage 4 chronic kidney disease, or unspecified chronic kidney disease: Secondary | ICD-10-CM | POA: Diagnosis not present

## 2023-03-09 DIAGNOSIS — I2510 Atherosclerotic heart disease of native coronary artery without angina pectoris: Secondary | ICD-10-CM | POA: Diagnosis not present

## 2023-03-09 DIAGNOSIS — D696 Thrombocytopenia, unspecified: Secondary | ICD-10-CM | POA: Diagnosis not present

## 2023-03-09 DIAGNOSIS — N1832 Chronic kidney disease, stage 3b: Secondary | ICD-10-CM | POA: Diagnosis not present

## 2023-03-09 DIAGNOSIS — E781 Pure hyperglyceridemia: Secondary | ICD-10-CM | POA: Diagnosis not present

## 2023-03-09 DIAGNOSIS — E785 Hyperlipidemia, unspecified: Secondary | ICD-10-CM | POA: Diagnosis not present

## 2023-03-09 DIAGNOSIS — G992 Myelopathy in diseases classified elsewhere: Secondary | ICD-10-CM | POA: Diagnosis not present

## 2023-03-09 DIAGNOSIS — N400 Enlarged prostate without lower urinary tract symptoms: Secondary | ICD-10-CM | POA: Diagnosis not present

## 2023-03-09 DIAGNOSIS — M5001 Cervical disc disorder with myelopathy,  high cervical region: Secondary | ICD-10-CM | POA: Diagnosis not present

## 2023-03-09 DIAGNOSIS — G4733 Obstructive sleep apnea (adult) (pediatric): Secondary | ICD-10-CM | POA: Diagnosis not present

## 2023-03-09 DIAGNOSIS — R339 Retention of urine, unspecified: Secondary | ICD-10-CM

## 2023-03-09 DIAGNOSIS — Z89512 Acquired absence of left leg below knee: Secondary | ICD-10-CM

## 2023-03-09 DIAGNOSIS — I482 Chronic atrial fibrillation, unspecified: Secondary | ICD-10-CM | POA: Diagnosis not present

## 2023-03-09 DIAGNOSIS — E1151 Type 2 diabetes mellitus with diabetic peripheral angiopathy without gangrene: Secondary | ICD-10-CM | POA: Diagnosis not present

## 2023-03-09 DIAGNOSIS — Z7901 Long term (current) use of anticoagulants: Secondary | ICD-10-CM | POA: Diagnosis not present

## 2023-03-09 DIAGNOSIS — D649 Anemia, unspecified: Secondary | ICD-10-CM | POA: Diagnosis not present

## 2023-03-09 DIAGNOSIS — H9190 Unspecified hearing loss, unspecified ear: Secondary | ICD-10-CM | POA: Diagnosis not present

## 2023-03-09 DIAGNOSIS — G8191 Hemiplegia, unspecified affecting right dominant side: Secondary | ICD-10-CM | POA: Diagnosis not present

## 2023-03-09 DIAGNOSIS — I4892 Unspecified atrial flutter: Secondary | ICD-10-CM | POA: Diagnosis not present

## 2023-03-09 DIAGNOSIS — I639 Cerebral infarction, unspecified: Secondary | ICD-10-CM | POA: Diagnosis not present

## 2023-03-09 DIAGNOSIS — G959 Disease of spinal cord, unspecified: Secondary | ICD-10-CM | POA: Diagnosis not present

## 2023-03-09 DIAGNOSIS — Z8249 Family history of ischemic heart disease and other diseases of the circulatory system: Secondary | ICD-10-CM | POA: Diagnosis not present

## 2023-03-09 DIAGNOSIS — M4802 Spinal stenosis, cervical region: Secondary | ICD-10-CM | POA: Diagnosis not present

## 2023-03-09 DIAGNOSIS — Z951 Presence of aortocoronary bypass graft: Secondary | ICD-10-CM | POA: Diagnosis not present

## 2023-03-09 DIAGNOSIS — Z961 Presence of intraocular lens: Secondary | ICD-10-CM | POA: Diagnosis not present

## 2023-03-09 DIAGNOSIS — G8250 Quadriplegia, unspecified: Secondary | ICD-10-CM

## 2023-03-09 DIAGNOSIS — Z89612 Acquired absence of left leg above knee: Secondary | ICD-10-CM | POA: Diagnosis not present

## 2023-03-09 DIAGNOSIS — I10 Essential (primary) hypertension: Secondary | ICD-10-CM | POA: Diagnosis not present

## 2023-03-09 DIAGNOSIS — R29707 NIHSS score 7: Secondary | ICD-10-CM | POA: Diagnosis not present

## 2023-03-09 DIAGNOSIS — I4819 Other persistent atrial fibrillation: Secondary | ICD-10-CM | POA: Diagnosis not present

## 2023-03-09 DIAGNOSIS — Z87891 Personal history of nicotine dependence: Secondary | ICD-10-CM | POA: Diagnosis not present

## 2023-03-09 DIAGNOSIS — E1152 Type 2 diabetes mellitus with diabetic peripheral angiopathy with gangrene: Secondary | ICD-10-CM | POA: Diagnosis not present

## 2023-03-09 DIAGNOSIS — E1122 Type 2 diabetes mellitus with diabetic chronic kidney disease: Secondary | ICD-10-CM | POA: Diagnosis not present

## 2023-03-09 DIAGNOSIS — M47811 Spondylosis without myelopathy or radiculopathy, occipito-atlanto-axial region: Secondary | ICD-10-CM | POA: Diagnosis not present

## 2023-03-09 MED ORDER — APIXABAN 2.5 MG PO TABS
2.5000 mg | ORAL_TABLET | Freq: Two times a day (BID) | ORAL | Status: DC
  Administered 2023-03-09 – 2023-03-13 (×9): 2.5 mg via ORAL
  Filled 2023-03-09 (×9): qty 1

## 2023-03-09 NOTE — Evaluation (Signed)
Occupational Therapy Evaluation Patient Details Name: Eric [name]. MRN: [id number] DOB: 11-23-1927 Today's Date: 03/09/2023   History of Present Illness 89 y.o. male presents 03/08/23 with right-sided weakness starting 3 days ago. MRI brain negative for acute stroke; MRI C spine showing multi level spinal stenosis though no abnormal cord signal change. PMH significant of atrial fibrillation/flutter Eliquis, CAD s/p CABG, diet-controlled diabetes mellitus type 2, PVD s/p right BKA , and CKD stage III   Clinical Impression   PTA, pt lives with spouse and typically Modified Independent with ADLs, most IADLs, driving and mobility (cane inside & walker in community). Pt presents now with deficits in R sided strength (including dominant R hand), coordination, sensation and standing balance. Pt requires Min A for UB ADLs, Mod A for LB ADLs and min A for transfers with RW. With pt's R sided weakness, pt is concerned about fall risk and not yet comfortable ambulating - will further assess in next sessions. Based on high PLOF and rehab potential, pt may benefit from intensive rehab services at [facility].      Recommendations for follow up therapy are one component of a multi-disciplinary discharge planning process, led by the attending physician.  Recommendations may be updated based on patient status, additional functional criteria and insurance authorization.   Assistance Recommended at Discharge Intermittent Supervision/Assistance  Patient can return home with the following A little help with bathing/dressing/bathroom;A little help with walking and/or transfers;Assistance with cooking/housework;Assist for transportation    Functional Status Assessment  Patient has had a recent decline in their functional status and demonstrates the ability to make significant improvements in function in a reasonable and predictable amount of time.  Equipment Recommendations  None recommended by OT    Recommendations  for Other Services Rehab consult     Precautions / Restrictions Precautions Precautions: Fall Precaution Comments: 2 recent falls; states broke L hip 6-8 wks ago (non-surgical); 2 days PTA Rt knee gave out Restrictions Weight Bearing Restrictions: No      Mobility Bed Mobility               General bed mobility comments: in recliner on entry    Transfers Overall transfer level: Needs assistance Equipment used: Rolling walker (2 wheels) Transfers: Sit to/from Stand, Bed to chair/wheelchair/BSC Sit to Stand: Min assist, Min guard     Step pivot transfers: Min guard     General transfer comment: Min A from recliner, min guard from BSC pushing from armrests      Balance Overall balance assessment: Needs assistance, History of Falls Sitting-balance support: No upper extremity supported, Feet supported Sitting balance-Leahy Scale: Good     Standing balance support: Bilateral upper extremity supported, Reliant on assistive device for balance Standing balance-Leahy Scale: Poor                             ADL either performed or assessed with clinical judgement   ADL Overall ADL's : Needs assistance/impaired Eating/Feeding: Set up;Sitting;Minimal assistance Eating/Feeding Details (indicate cue type and reason): some issues attempting to use L UE for task as R dominant UE with difficulty maintaining grasp Grooming: Minimal assistance;Sitting   Upper Body Bathing: Minimal assistance;Sitting   Lower Body Bathing: Moderate assistance;Sit to/from stand   Upper Body Dressing : Minimal assistance;Sitting   Lower Body Dressing: Moderate assistance;Sit to/from stand   Toilet Transfer: Minimal assistance;Stand-pivot;BSC/3in1;Rolling walker (2 wheels) Toilet Transfer Details (indicate cue type and reason): minor cues  for sequencing, standing from recliner with assist but able to stand from [facility] pushing from armrests fairly easily Toileting- Clothing Manipulation  and Hygiene: Sit to/from stand;Moderate assistance Toileting - Clothing Manipulation Details (indicate cue type and reason): assisting with clothing mgmt but required assist for posterior hygiene in standing             Vision Ability to See in Adequate Light: 0 Adequate Patient Visual Report: No change from baseline Vision Assessment?: No apparent visual deficits     Perception     Praxis      Pertinent Vitals/Pain Pain Assessment Pain Assessment: No/denies pain     Hand Dominance Right   Extremity/Trunk Assessment Upper Extremity Assessment Upper Extremity Assessment: RUE deficits/detail RUE Deficits / Details: deficits in strength, sensation; reports dropping items but able to make light grasp. may benefit from built upfoam grisp on utensils RUE Sensation: decreased light touch RUE Coordination: decreased fine motor   Lower Extremity Assessment Lower Extremity Assessment: Defer to PT evaluation   Cervical / Trunk Assessment Cervical / Trunk Assessment: Normal   Communication Communication Communication: HOH   Cognition Arousal/Alertness: Awake/alert Behavior During Therapy: WFL for tasks assessed/performed Overall Cognitive Status: Within Functional Limits for tasks assessed                                       General Comments       Exercises     Shoulder Instructions      Home Living Family/patient expects to be discharged to:: Private residence Living Arrangements: Spouse/significant other Available Help at Discharge: Family;Available 24 hours/day Type of Home: House Home Access: Ramped entrance;Level entry     Home Layout: Two level Alternate Level Stairs-Number of Steps: stair lift from basement entrance (level entry) - this is how pt usually enters/exits the home   Bathroom Shower/Tub: Walk-in shower   Bathroom Toilet: Handicapped height Bathroom Accessibility: Yes   Home Equipment: Agricultural consultant (2 wheels);Rollator (4  wheels);Wheelchair - Engineer, technical sales - power;Shower seat;Hand held shower head;Grab bars - tub/shower;Other (comment) (lift chair; rolling shower chair)          Prior Functioning/Environment Prior Level of Function : Independent/Modified Independent             Mobility Comments: uses cane and prosthetic primarily for mobility. use of RW when out in community (keeps in car) ADLs Comments: MOD I for ADLs, goes out to eat with wife, drives, does some cooking at home. family assist with groceries more recently, med mgmt for pt and his spouse        OT Problem List: Decreased strength;Decreased activity tolerance;Impaired balance (sitting and/or standing);Decreased coordination;Impaired UE functional use;Impaired sensation      OT Treatment/Interventions: Self-care/ADL training;Therapeutic exercise;Energy conservation;DME and/or AE instruction;Therapeutic activities;Patient/family education;Balance training    OT Goals(Current goals can be found in the care plan section) Acute Rehab OT Goals Patient Stated Goal: regain independence OT Goal Formulation: With patient Time For Goal Achievement: 03/23/23 Potential to Achieve Goals: Good ADL Goals Pt Will Perform Grooming: with modified independence;standing Pt Will Perform Lower Body Bathing: with modified independence;sit to/from stand;sitting/lateral leans Pt Will Perform Lower Body Dressing: with modified independence;sit to/from stand;sitting/lateral leans Pt Will Transfer to Toilet: with modified independence;ambulating Pt/caregiver will Perform Home Exercise Program: Increased strength;Right Upper extremity;Independently;With written HEP provided  OT Frequency: Min 2X/week    Co-evaluation  AM-PAC OT "6 Clicks" Daily Activity     Outcome Measure Help from another person eating meals?: A Little Help from another person taking care of personal grooming?: A Little Help from another person toileting, which  includes using toliet, bedpan, or urinal?: A Lot Help from another person bathing (including washing, rinsing, drying)?: A Lot Help from another person to put on and taking off regular upper body clothing?: A Little Help from another person to put on and taking off regular lower body clothing?: A Lot 6 Click Score: 15   End of Session Equipment Utilized During Treatment: Gait belt;Rolling walker (2 wheels) Nurse Communication: Mobility status  Activity Tolerance: Patient tolerated treatment well Patient left: in chair;with call bell/phone within reach;with chair alarm set  OT Visit Diagnosis: Other abnormalities of gait and mobility (R26.89);Unsteadiness on feet (R26.81);Muscle weakness (generalized) (M62.81);History of falling (Z91.81)                Time: 1233-1300 OT Time Calculation (min): 27 min Charges:  OT General Charges $OT Visit: 1 Visit OT Evaluation $OT Eval Low Complexity: 1 Low OT Treatments $Self Care/Home Management : 8-22 mins  Bradd Canary, OTR/L Acute Rehab Services Office: [phone]   Lorre Munroe 03/09/2023, 2:07 PM

## 2023-03-09 NOTE — Progress Notes (Signed)
   Inpatient Rehab Admissions Coordinator :  Per therapy recommendations, patient was screened for CIR candidacy by Ottie Glazier RN MSN. Noted Neurosurgical consult pending. At this time patient appears to be a potential candidate for CIR. I will place a rehab consult per protocol for full assessment. Please call me with any questions.  Ottie Glazier RN MSN Admissions Coordinator [phone]

## 2023-03-09 NOTE — Evaluation (Signed)
Speech Language Pathology Evaluation Patient Details Name: Eric [name]. MRN: [id number] DOB: 12-Feb-1928 Today's Date: 03/09/2023 Time: 0865-7846 SLP Time Calculation (min) (ACUTE ONLY): 15 min  Problem List:  Patient Active Problem List   Diagnosis Date Noted   CVA (cerebral vascular accident) (HCC) 03/08/2023   Thrombocytopenia (HCC) 03/08/2023   Abnormal gait 02/12/2023   Coping style affecting medical condition 12/11/2022   Traumatic closed trochanteric fracture of femur with minimal displacement with routine healing, left 12/05/2022   Greater trochanter fracture (HCC) 12/04/2022   Closed left femoral fracture (HCC) 11/30/2022   Pain due to onychomycosis of toenails of both feet 04/23/2022   Hammer toes, bilateral 04/23/2022   Porokeratosis 04/23/2022   Diverticulosis of colon with hemorrhage    Erosive gastritis    Benign neoplasm of transverse colon    Benign neoplasm of descending colon    Acute GI bleeding 04/27/2021   Acute blood loss anemia 04/27/2021   Acute kidney injury superimposed on CKD (HCC) 04/27/2021   Hematuria 07/24/2020   Hypertensive urgency 03/08/2019   Hx of BKA, left (HCC) 01/13/2018   Nausea and vomiting in adult 12/26/2017   Pressure injury of skin 12/23/2017   C. difficile colitis 12/22/2017   UTI (urinary tract infection) 12/22/2017   Hyponatremia 12/22/2017   Normocytic anemia 12/22/2017   ASO (arteriosclerosis obliterans) 11/23/2017   Chronic anticoagulation 09/26/2015   History of anticoagulant therapy 09/26/2015   PVD (peripheral vascular disease) (HCC) 06/02/2012   Type 2 diabetes mellitus (HCC) 05/17/2012   Benign prostatic hyperplasia 11/16/2011   AKI (acute kidney injury) (HCC) 08/26/2011   Chronic kidney disease (CKD), stage III (moderate) (HCC) 08/26/2011   Chronic coronary artery disease 03/25/2011   Obstructive sleep apnea 09/15/2010   Other specified extrapyramidal and movement disorders 09/15/2010   PERSONAL HISTORY OF  COLONIC POLYPS 10/25/2009   Vitamin B-complex deficiency 03/04/2009   ATRIAL FIBRILLATION 12/25/2008   Atrial flutter (HCC) 12/25/2008   Hyperlipidemia 11/24/2006   Essential hypertension 11/24/2006   Past Medical History:  Past Medical History:  Diagnosis Date   Atrial flutter (HCC)    s/p RFCA   CAD (coronary artery disease)    cath 2003, occluded S-RCA, occluded S-Dx, L-LAD ok, s/p PTCA to LAD   Cataract    CKD (chronic kidney disease) stage 3, GFR 30-59 ml/min (HCC)    Diabetes mellitus    diet controlled   Hearing aid worn    bilateral   History of kidney stones    Long term (current) use of anticoagulants    Neuromuscular disorder (HCC)    OSA (obstructive sleep apnea) 12/11   very mild, AHI 7/hr   Persistent atrial fibrillation (HCC) 09/26/2015   Pure hyperglyceridemia    PVD (peripheral vascular disease) (HCC)    angioplasty of his right lower extremity in [place] by Dr.[name] 2013   Unspecified essential hypertension    Wears dentures    full upper and lower   Past Surgical History:  Past Surgical History:  Procedure Laterality Date   ABDOMINAL AORTOGRAM W/LOWER EXTREMITY N/A 11/15/2017   Procedure: ABDOMINAL AORTOGRAM W/LOWER EXTREMITY;  Surgeon: Sherren Kerns, MD;  Location: [facility];  Service: Cardiovascular;  Laterality: N/A;   Adenosine Myoview  3/06   EF 56%, neg. Ischemia   Adenosine Myoview  02/18/07   nml   AMPUTATION Left 12/15/2017   Procedure: AMPUTATION BELOW KNEE;  Surgeon: Annice Needy, MD;  Location: [facility] ORS;  Service: Vascular;  Laterality: Left;  ANGIOPLASTY  1/99   CAD- diogonal with rotational artherectomy   Arthrectomy     of LAD & PTCA   BLEPHAROPLASTY Bilateral    CARDIAC CATHETERIZATION  1/00   CARDIOVERSION  1/04   CARDIOVERSION  5/07   hospital- a flutter   CARPAL TUNNEL RELEASE     ? bilateral   CATARACT EXTRACTION W/PHACO Right 07/28/2017   Procedure: CATARACT EXTRACTION PHACO AND INTRAOCULAR LENS PLACEMENT (IOC)  RIGHT DIABETIC;  Surgeon: Lockie Mola, MD;  Location: [facility];  Service: Ophthalmology;  Laterality: Right;  Diabetic - diet controlled   CATARACT EXTRACTION W/PHACO Left 08/18/2017   Procedure: CATARACT EXTRACTION PHACO AND INTRAOCULAR LENS PLACEMENT (IOC);  Surgeon: Lockie Mola, MD;  Location: [facility];  Service: Ophthalmology;  Laterality: Left;  DIABETES - oral meds   COLONOSCOPY W/ BIOPSIES  10/01/06   sigmoid polyp bx neg, 3 years   COLONOSCOPY WITH PROPOFOL N/A 04/28/2021   Procedure: COLONOSCOPY WITH PROPOFOL;  Surgeon: Iva Boop, MD;  Location: [facility] ENDOSCOPY;  Service: Endoscopy;  Laterality: N/A;   CORONARY ANGIOPLASTY  4/03   cutting balloon PTCA pLAD into Diag   CORONARY ARTERY BYPASS GRAFT  2000   LIMA-LAD, SVG-RCA, SVG-Diag; SVG-Diag & SVG-RCA occluded 2003   FRACTURE SURGERY     HAND SURGERY  08/27/09   R thumb procedure wit Scaphoid Gragt and screws, Dr Merlyn Lot   LOWER EXTREMITY ANGIOGRAPHY Right 01/25/2019   Procedure: LOWER EXTREMITY ANGIOGRAPHY;  Surgeon: Renford Dills, MD;  Location: [facility] INVASIVE CV [facility];  Service: Cardiovascular;  Laterality: Right;   NM MYOVIEW LTD  4/11   normal   PERIPHERAL VASCULAR CATHETERIZATION Right 06/27/2015   Procedure: Lower Extremity Angiography;  Surgeon: Annice Needy, MD;  Location: [facility] INV[facility] CV [facility];  Service: Cardiovascular;  Laterality: Right;   PERIPHERAL VASCULAR CATHETERIZATION  06/27/2015   Procedure: Lower Extremity Intervention;  Surgeon: Annice Needy, MD;  Location: [facility] INV[facility] CV LAB;  Service: Cardiovascular;;   POLYPECTOMY  04/28/2021   Procedure: POLYPECTOMY;  Surgeon: Iva Boop, MD;  Location: [facility] ENDOSCOPY;  Service: Endoscopy;;   HPI:  Patient is a 89 y.o. male with PMH: a-fib/flutter, CAD s/p CABG, DM-2 (diet controlled), PVD s/p right BKA, CKD stage III. He presented to the hospital on 03/08/23 with right sided weakness starting three days prior. MRI negative for acute  stroke. MRI C Spine showed multi level spinal stenosis though no abnormal cord signal change.   Assessment / Plan / Recommendation Clinical Impression  Patient appears to be WNL for cognition, language, speech. He is oriented x4 and is able to recall and describe recent therapeutic and medical interventions and recommendations. He does appear to be hard of hearing but comprehension is WNL. He demonstrates good awareness, safety awareness, reasoning, problem solving and memory. He lives at home with his wife who he reports has memory impairment. (sounds like dementia) His son comes by frequently and does errands for them and he has daughters that come over to take care of medications. When asked about needing extra help now, patient did state that his son had mentioned possibility of staying with him more.    SLP Assessment  SLP Recommendation/Assessment: Patient does not need any further Speech Lanaguage Pathology Services    Recommendations for follow up therapy are one component of a multi-disciplinary discharge planning process, led by the attending physician.  Recommendations may be updated based on patient status, additional functional criteria and insurance authorization.    Follow  Up Recommendations  No SLP follow up    Assistance Recommended at Discharge  None  Functional Status Assessment Patient has not had a recent decline in their functional status  Frequency and Duration           SLP Evaluation Cognition  Overall Cognitive Status: Within Functional Limits for tasks assessed Arousal/Alertness: Awake/alert Orientation Level: Oriented X4       Comprehension  Auditory Comprehension Overall Auditory Comprehension: Appears within functional limits for tasks assessed Yes/No Questions: Within Functional Limits Commands: Within Functional Limits    Expression Expression Primary Mode of Expression: Verbal Verbal Expression Overall Verbal Expression: Appears within functional  limits for tasks assessed Written Expression Dominant Hand: Right   Oral / Motor  Oral Motor/Sensory Function Overall Oral Motor/Sensory Function: Within functional limits Motor Speech Overall Motor Speech: Appears within functional limits for tasks assessed Respiration: Within functional limits Phonation: Normal Resonance: Within functional limits Articulation: Within functional limitis Intelligibility: Intelligible Motor Planning: Witnin functional limits           Angela Nevin, MA, CCC-SLP Speech Therapy

## 2023-03-09 NOTE — Progress Notes (Signed)
OT Cancellation Note  Patient Details Name: Eric [name]. MRN: [id number] DOB: 1927-08-26   Cancelled Treatment:    Reason Eval/Treat Not Completed: Other (comment) Pt just received lunch tray. Will follow up for OT eval as schedule permits.  Lorre Munroe 03/09/2023, 11:45 AM

## 2023-03-09 NOTE — Progress Notes (Signed)
Subjective: No sig changes  Exam: Vitals:   03/09/23 0640 03/09/23 0800  BP: 137/66   Pulse: 63   Resp: 20   Temp:  97.6 F (36.4 C)  SpO2: 95%    Gen: In bed, NAD Resp: non-labored breathing, no acute distress Abd: soft, nt  Neuro: MS: Awake, alert, oriented CN: Face is symmetric, EOMI Motor: He has flexor > extensor weakness of the right upper extremity as well as significant weakness of the right lower extremity Sensory: Diminished light touch in the right upper extremity, intact in the right lower extremity and left side DTR: Diminished throughout the right side, he does have 2+ reflexes in his left biceps and brachial radialis  Pertinent Labs: Creatinine 1.04  Impression: 89 year old male with acute on chronic right-sided hemiparesis in the setting of severe compressive spinal stenosis.  His exam is slightly unusual with difficulty obtaining reflexes on the right side where I would usually expect him to be quite brisk, but it is possible that it is just too early at this point.  Given the involvement of both the arm and leg, I think a peripheral etiology is less likely.  Given the degree of stenosis, I would recommend getting neurosurgery to evaluate.   He is normally fairly active, and does the cooking, etc. him and his wife at home.  Recommendations: 1) neurosurgical evaluation  Ritta Slot, MD [facility] [phone]  If 7pm- 7am, please page neurology on call as listed in AMION.

## 2023-03-09 NOTE — Progress Notes (Signed)
Inpatient Rehab Coordinator Note:  I met with pt and his son, [name], at bedside to discuss CIR recommendations and goals/expectations of CIR stay.  We reviewed 3 hrs/day of therapy, physician follow up, and average length of stay 2 weeks (dependent upon progress) with goals tbd based on PMR MD recommendations.  Pt previously on CIR in April 2024 for a hip fx ipsilateral to his BKA and did well.  Has good family support at home.  We discussed need for prior auth and I will request this as soon as rehab MD has a chance to weigh in on goals/ELOS.  Estill Dooms, PT, DPT Admissions Coordinator [phone] 03/09/23  2:39 PM

## 2023-03-09 NOTE — Consult Note (Signed)
CC: Central cervical stenosis  HPI:     Patient is a 89 y.o. male who presents with PMH as indicated in chart, including but not limited to, a flutter/fib on Eliquis, hyperlipidemia, hypertension, diabetes, CKD, CAD, OSA, PVD s/p BKA. Presented to ED on 03/08/23 w/ CC of acute on chronic worsening weakness to RUE/RLE. Patient states this has been occurring gradually over the last few years, w/ a noticeable increase the last week or so. Denies any UE/LE pain, N/T, B&B dysfunction, SA, altered gait, increasing falls. Ambulates w/ walker at baseline. H/o L AKA ~24yr ago.    Patient Active Problem List   Diagnosis Date Noted   CVA (cerebral vascular accident) (HCC) 03/08/2023   Thrombocytopenia (HCC) 03/08/2023   Abnormal gait 02/12/2023   Coping style affecting medical condition 12/11/2022   Traumatic closed trochanteric fracture of femur with minimal displacement with routine healing, left 12/05/2022   Greater trochanter fracture (HCC) 12/04/2022   Closed left femoral fracture (HCC) 11/30/2022   Pain due to onychomycosis of toenails of both feet 04/23/2022   Hammer toes, bilateral 04/23/2022   Porokeratosis 04/23/2022   Diverticulosis of colon with hemorrhage    Erosive gastritis    Benign neoplasm of transverse colon    Benign neoplasm of descending colon    Acute GI bleeding 04/27/2021   Acute blood loss anemia 04/27/2021   Acute kidney injury superimposed on CKD (HCC) 04/27/2021   Hematuria 07/24/2020   Hypertensive urgency 03/08/2019   Hx of BKA, left (HCC) 01/13/2018   Nausea and vomiting in adult 12/26/2017   Pressure injury of skin 12/23/2017   C. difficile colitis 12/22/2017   UTI (urinary tract infection) 12/22/2017   Hyponatremia 12/22/2017   Normocytic anemia 12/22/2017   ASO (arteriosclerosis obliterans) 11/23/2017   Chronic anticoagulation 09/26/2015   History of anticoagulant therapy 09/26/2015   PVD (peripheral vascular disease) (HCC) 06/02/2012   Type 2 diabetes  mellitus (HCC) 05/17/2012   Benign prostatic hyperplasia 11/16/2011   AKI (acute kidney injury) (HCC) 08/26/2011   Chronic kidney disease (CKD), stage III (moderate) (HCC) 08/26/2011   Chronic coronary artery disease 03/25/2011   Obstructive sleep apnea 09/15/2010   Other specified extrapyramidal and movement disorders 09/15/2010   PERSONAL HISTORY OF COLONIC POLYPS 10/25/2009   Vitamin B-complex deficiency 03/04/2009   ATRIAL FIBRILLATION 12/25/2008   Atrial flutter (HCC) 12/25/2008   Hyperlipidemia 11/24/2006   Essential hypertension 11/24/2006   Past Medical History:  Diagnosis Date   Atrial flutter (HCC)    s/p RFCA   CAD (coronary artery disease)    cath 2003, occluded S-RCA, occluded S-Dx, L-LAD ok, s/p PTCA to LAD   Cataract    CKD (chronic kidney disease) stage 3, GFR 30-59 ml/min (HCC)    Diabetes mellitus    diet controlled   Hearing aid worn    bilateral   History of kidney stones    Long term (current) use of anticoagulants    Neuromuscular disorder (HCC)    OSA (obstructive sleep apnea) 12/11   very mild, AHI 7/hr   Persistent atrial fibrillation (HCC) 09/26/2015   Pure hyperglyceridemia    PVD (peripheral vascular disease) (HCC)    angioplasty of his right lower extremity in  by Dr.[name] 2013   Unspecified essential hypertension    Wears dentures    full upper and lower    Past Surgical History:  Procedure Laterality Date   ABDOMINAL AORTOGRAM W/LOWER EXTREMITY N/A 11/15/2017   Procedure: ABDOMINAL AORTOGRAM W/LOWER EXTREMITY;  Surgeon: Darrick Penna,  Janetta Hora, MD;  Location: [facility];  Service: Cardiovascular;  Laterality: N/A;   Adenosine Myoview  3/06   EF 56%, neg. Ischemia   Adenosine Myoview  02/18/07   nml   AMPUTATION Left 12/15/2017   Procedure: AMPUTATION BELOW KNEE;  Surgeon: Annice Needy, MD;  Location: [facility];  Service: Vascular;  Laterality: Left;   ANGIOPLASTY  1/99   CAD- diogonal with rotational artherectomy   Arthrectomy      of LAD & PTCA   BLEPHAROPLASTY Bilateral    CARDIAC CATHETERIZATION  1/00   CARDIOVERSION  1/04   CARDIOVERSION  5/07   hospital- a flutter   CARPAL TUNNEL RELEASE     ? bilateral   CATARACT EXTRACTION W/PHACO Right 07/28/2017   Procedure: CATARACT EXTRACTION PHACO AND INTRAOCULAR LENS PLACEMENT (IOC) RIGHT DIABETIC;  Surgeon: Lockie Mola, MD;  Location: [facility];  Service: Ophthalmology;  Laterality: Right;  Diabetic - diet controlled   CATARACT EXTRACTION W/PHACO Left 08/18/2017   Procedure: CATARACT EXTRACTION PHACO AND INTRAOCULAR LENS PLACEMENT (IOC);  Surgeon: Lockie Mola, MD;  Location: [facility];  Service: Ophthalmology;  Laterality: Left;  DIABETES - oral meds   COLONOSCOPY W/ BIOPSIES  10/01/06   sigmoid polyp bx neg, 3 years   COLONOSCOPY WITH PROPOFOL N/A 04/28/2021   Procedure: COLONOSCOPY WITH PROPOFOL;  Surgeon: Iva Boop, MD;  Location: [facility];  Service: Endoscopy;  Laterality: N/A;   CORONARY ANGIOPLASTY  4/03   cutting balloon PTCA pLAD into Diag   CORONARY ARTERY BYPASS GRAFT  2000   LIMA-LAD, SVG-RCA, SVG-Diag; SVG-Diag & SVG-RCA occluded 2003   FRACTURE SURGERY     HAND SURGERY  08/27/09   R thumb procedure wit Scaphoid Gragt and screws, Dr Merlyn Lot   LOWER EXTREMITY ANGIOGRAPHY Right 01/25/2019   Procedure: LOWER EXTREMITY ANGIOGRAPHY;  Surgeon: Renford Dills, MD;  Location: [facility];  Service: Cardiovascular;  Laterality: Right;   NM MYOVIEW LTD  4/11   normal   PERIPHERAL VASCULAR CATHETERIZATION Right 06/27/2015   Procedure: Lower Extremity Angiography;  Surgeon: Annice Needy, MD;  Location: [facility];  Service: Cardiovascular;  Laterality: Right;   PERIPHERAL VASCULAR CATHETERIZATION  06/27/2015   Procedure: Lower Extremity Intervention;  Surgeon: Annice Needy, MD;  Location: [facility];  Service: Cardiovascular;;   POLYPECTOMY  04/28/2021   Procedure: POLYPECTOMY;  Surgeon: Iva Boop, MD;  Location: [facility];  Service: Endoscopy;;    Medications Prior to Admission  Medication Sig Dispense Refill Last Dose   acetaminophen (TYLENOL) 325 MG tablet Take 2 tablets (650 mg total) by mouth every 6 (six) hours as needed for mild pain (or temp >/= 101 F).   03/07/2023   amLODipine (NORVASC) 10 MG tablet Take 1 tablet (10 mg total) by mouth daily. 90 tablet 1 03/07/2023   apixaban (ELIQUIS) 2.5 MG TABS tablet Take 1 tablet (2.5 mg total) by mouth 2 (two) times daily. OFFICE VISIT NEEDED FOR ADDITIONAL REFILLS 180 tablet 1 03/08/2023 at 8 am   atenolol (TENORMIN) 25 MG tablet Take 1/2 tablet (12.5 mg total) by mouth daily. 15 tablet 3 03/08/2023   bethanechol (URECHOLINE) 10 MG tablet Take 0.5 tablets (5 mg total) by mouth 3 (three) times daily. 45 tablet 1 03/08/2023   Cinnamon 500 MG TABS Take 500 mg by mouth daily.   03/08/2023   Cranberry 500 MG CAPS Take 500 mg by mouth daily.   03/08/2023  cyanocobalamin (VITAMIN B12) 500 MCG tablet Take 500 mcg by mouth daily.   03/08/2023   ferrous sulfate 325 (65 FE) MG EC tablet Take 325 mg by mouth at bedtime.   03/07/2023   finasteride (PROSCAR) 5 MG tablet TAKE 1 TABLET BY MOUTH ONCE DAILY (Patient taking differently: Take 5 mg by mouth daily.) 90 tablet 3 03/08/2023   fluticasone (FLONASE) 50 MCG/ACT nasal spray Place 1 spray into both nostrils daily.  2 03/08/2023   furosemide (LASIX) 40 MG tablet Take 40 mg by mouth daily.   03/08/2023   hydrALAZINE (APRESOLINE) 25 MG tablet TAKE 3 TABLETS BY MOUTH IN THE MORNING AND AT BEDTIME (Patient taking differently: Take 75 mg by mouth 2 (two) times daily.) 180 tablet 2 03/08/2023   Multiple Vitamin (DAILY MULTIVITAMIN PO) Take 1 tablet by mouth daily.   03/08/2023   pentoxifylline (TRENTAL) 400 MG CR tablet TAKE 1 TABLET BY MOUTH EVERY 12 HOURS . (Patient taking differently: Take 400 mg by mouth every 12 (twelve) hours. TAKE 1 TABLET BY MOUTH EVERY 12 HOURS .) 180 tablet 1 03/08/2023   rosuvastatin  (CRESTOR) 40 MG tablet Take 1 tablet (40 mg total) by mouth daily. 90 tablet 1 03/07/2023   tamsulosin (FLOMAX) 0.4 MG CAPS capsule Take 2 capsules (0.8 mg total) by mouth daily after supper. 60 capsule 3 03/07/2023   ONETOUCH ULTRA test strip USE 1 STRIP TO CHECK GLUCOSE ONCE DAILY (Patient taking differently: 1 each by Other route See admin instructions. USE 1 STRIP TO CHECK GLUCOSE ONCE DAILY) 50 each 0    temazepam (RESTORIL) 7.5 MG capsule Take 1 capsule (7.5 mg total) by mouth at bedtime as needed for sleep. (Patient not taking: Reported on 03/08/2023) 15 capsule 0 Not Taking   Allergies  Allergen Reactions   Altace [Ramipril] Cough   Isosorbide Mononitrate Other (See Comments)    Unknown reaction   Quinidine Gluconate Rash    Social History   Tobacco Use   Smoking status: Never   Smokeless tobacco: Former    Quit date: 1994  Substance Use Topics   Alcohol use: No    Family History  Problem Relation Age of Onset   Stroke Father    Aneurysm Father    Hypertension Father    Prostate cancer Brother    Hypertension Mother    Lung cancer Brother        smoker   Thrombosis Brother    Other Brother        RF valve disorder (smoker)   Lung cancer Brother        smoker   Breast cancer Sister    Liver cancer Brother    Other Sister        cerebral hemorrhage    Objective:   Patient Vitals for the past 8 hrs:  BP Temp Temp src Pulse Resp SpO2  03/09/23 0800 -- 97.6 F (36.4 C) Oral -- -- --  03/09/23 0640 137/66 -- -- 63 20 95 %   I/O last 3 completed shifts: In: 402 [P.O.:240; I.V.:162] Out: 700 [Urine:700] Total I/O In: 480 [P.O.:480] Out: -    Awake, alert, oriented Speech fluent, appropriate CN grossly intact 5/5 LUE/LLE proximally. 4/5 RUE grip/triceps, 3/5 biceps/deltoid. 4/5 diffusely RLE Neg hoffman, clonus SILTx4, MAEx4, FCx4   MRI Cervical Spine: CLINICAL DATA:  Myelopathy, chronic, cervical spine.   EXAM: MRI CERVICAL SPINE WITHOUT CONTRAST    TECHNIQUE: Multiplanar, multisequence MR imaging of the cervical spine was performed. No intravenous  contrast was administered.   COMPARISON:  CT done yesterday.   FINDINGS: Alignment: No malalignment.   Vertebrae: No fracture or focal bone lesion. Degenerative endplate marrow changes, with mild edema at the C3-4 and C5-6 levels, which could relate to regional pain.   Cord: No primary cord lesion. See below regarding stenosis from C3-4 through C5-6.   Posterior Fossa, vertebral arteries, paraspinal tissues: Negative   Disc levels:   The foramen magnum is widely patent. There is ordinary mild osteoarthritis of the C1-2 articulation but no encroachment upon the neural structures.   C2-3: Endplate osteophytes and bulging of the disc. Mild facet and ligamentous hypertrophy. Mild canal narrowing but no compressive effect upon the cord. Bilateral foraminal narrowing which could affect the C3 nerves.   C3-4: Endplate osteophytes and central disc herniation. Severe spinal stenosis in the midline with AP diameter of the canal only 4 mm. Effacement of the subarachnoid space and deformity of the cord. Bilateral foraminal stenosis could affect the C4 nerves.   C4-5: Endplate osteophytes and central disc herniation. AP diameter of the canal in the midline as narrow as 6 mm. Slight indentation of the cord. Bilateral foraminal narrowing that could affect the C5 nerves.   C5-6: Endplate osteophytes and bulging of the disc. Narrowing of the ventral subarachnoid space but no compressive effect upon the cord. AP diameter of the canal in the midline 7.5 mm. Bilateral foraminal stenosis that could affect either C6 nerve.   C6-7: Mild bulging of the disc. No canal stenosis. Foraminal narrowing on the left that could affect the C7 nerve.   C7-T1: Mild bulging of the disc. No central canal stenosis. No compressive foraminal narrowing.   IMPRESSION: 1. Severe multifactorial spinal  stenosis at C3-4 with AP diameter of the canal only 4 mm. Effacement of the subarachnoid space and deformity of the cord. No abnormal cord signal at this time however. None the less, this finding could result in compressive myelopathy. 2. Spinal stenosis at C4-5 and C5-6 with AP diameter of the canal only 6 mm at C4-5 and 7.5 mm at C5-6. 3. Foraminal stenosis that could cause neural compression bilaterally at C2-3, bilateral at C3-4, bilateral at C4-5, bilateral at C5-6 and on the left at C6-7.    Assessment:   This is a 89 yo male with chronic, severe multilevel, multifactorial cervical stenosis with numerous comorbidities, currently on Eliquis.   Plan:   Given patient age as well as numerous comorbidities, do not recommend neurosurgical intervention at this time. Discussed the natural progression of central cervical stenosis and patient displayed understanding. Please call w/ questions/concerns.   Patrici Ranks, [facility]

## 2023-03-09 NOTE — Evaluation (Addendum)
Physical Therapy Evaluation Patient Details Name: Eric [name]. MRN: [id number] DOB: 24-Feb-1928 Today's Date: 03/09/2023  History of Present Illness  89 y.o. male presents 03/08/23 with right-sided weakness starting 3 days ago. MRI brain negative for acute stroke; Neuro Consult ?motor neuron disease vs intraspinal tumors vs other; MRI cervical spine pendingPMH significant of atrial fibrillation/flutter Eliquis, CAD s/p CABG, diet-controlled diabetes mellitus type 2, PVD s/p right BKA , and CKD stage III  Clinical Impression   Pt admitted secondary to problem above with deficits below. PTA patient was modified independent with all ADLs and mobility with use of prosthesis and RW until a few days ago with increased weakness.  Pt currently requires mod assist for bed mobility, mod assist for donning prosthesis, min assist for sit to stand and transfer bed to chair. Patient is highly motivated and can benefit from intensive inpatient rehab.  Anticipate patient will benefit from PT to address problems listed below.Will continue to follow acutely to maximize functional mobility independence and safety.           If plan is discharge home, recommend the following: A lot of help with walking and/or transfers;Assistance with cooking/housework;Assist for transportation   Can travel by private vehicle        Equipment Recommendations None recommended by PT  Recommendations for Other Services  Rehab consult;OT consult    Functional Status Assessment Patient has had a recent decline in their functional status and demonstrates the ability to make significant improvements in function in a reasonable and predictable amount of time.     Precautions / Restrictions Precautions Precautions: Fall Precaution Comments: 2 recent falls; states broke L hip 6-8 wks ago (non-surgical); 2 days PTA Rt knee gave out Restrictions Weight Bearing Restrictions: No      Mobility  Bed Mobility Overal bed  mobility: Needs Assistance Bed Mobility: Rolling, Sidelying to Sit Rolling: Min assist Sidelying to sit: Mod assist, HOB elevated       General bed mobility comments: pt unable to come to sit wtih HOB flat; requested elevated HOB and with rail mod assist to come to sit    Transfers Overall transfer level: Needs assistance Equipment used: Rolling walker (2 wheels) Transfers: Sit to/from Stand, Bed to chair/wheelchair/BSC Sit to Stand: Min assist, From elevated surface (bed elevated ~2")   Step pivot transfers: Min guard       General transfer comment: unable to stand from bed at lowest height (has been using lift chair since hip fracture); bed elevated only 2" and then min assist    Ambulation/Gait               General Gait Details: deferred due to degree of RLE weakness; pivotal steps to chair only  Stairs            Wheelchair Mobility     Tilt Bed    Modified Rankin (Stroke Patients Only)       Balance Overall balance assessment: Needs assistance, History of Falls Sitting-balance support: No upper extremity supported, Feet supported Sitting balance-Leahy Scale: Good Sitting balance - Comments: able to assist with donning lt BKA prosthesis using LUE (required assist due to RUE weakness)   Standing balance support: Bilateral upper extremity supported, Reliant on assistive device for balance Standing balance-Leahy Scale: Poor                               Pertinent Vitals/Pain Pain Assessment Pain  Assessment: No/denies pain    Home Living Family/patient expects to be discharged to:: Private residence Living Arrangements: Spouse/significant other Available Help at Discharge: Family;Available 24 hours/day (children also in/out throughout the week) Type of Home: House Home Access: Ramped entrance;Level entry (level entrance from basement)     Alternate Level Stairs-Number of Steps: stair lift from basement entrance (level entry) - this  is how pt usually enters/exits the home Home Layout: Two level Home Equipment: Rolling Walker (2 wheels);Rollator (4 wheels);Wheelchair - Engineer, technical sales - power;Shower seat;Hand held shower head;Grab bars - tub/shower;Other (comment) (lift chair)      Prior Function Prior Level of Function : Independent/Modified Independent             Mobility Comments: in house uses prosthesis and RW; outside home uses rollator but recently got power chair he used at a reunion ADLs Comments: modified independent     Hand Dominance   Dominant Hand: Right    Extremity/Trunk Assessment   Upper Extremity Assessment Upper Extremity Assessment: Defer to OT evaluation    Lower Extremity Assessment Lower Extremity Assessment: RLE deficits/detail RLE Deficits / Details: quads 2+, ankle DF 3    Cervical / Trunk Assessment Cervical / Trunk Assessment: Normal  Communication   Communication: HOH  Cognition Arousal/Alertness: Awake/alert Behavior During Therapy: WFL for tasks assessed/performed Overall Cognitive Status: Within Functional Limits for tasks assessed                                          General Comments General comments (skin integrity, edema, etc.): Reports normally can don prosthesis himself, but unable currently due to RUE weakness    Exercises     Assessment/Plan    PT Assessment Patient needs continued PT services  PT Problem List Decreased strength;Decreased balance;Decreased mobility       PT Treatment Interventions DME instruction;Gait training    PT Goals (Current goals can be found in the Care Plan section)  Acute Rehab PT Goals Patient Stated Goal: return home after rehab PT Goal Formulation: With patient Time For Goal Achievement: 03/23/23 Potential to Achieve Goals: Good    Frequency Min 1X/week     Co-evaluation               AM-PAC PT "6 Clicks" Mobility  Outcome Measure Help needed turning from your back to your side  while in a flat bed without using bedrails?: A Little Help needed moving from lying on your back to sitting on the side of a flat bed without using bedrails?: A Lot Help needed moving to and from a bed to a chair (including a wheelchair)?: A Little Help needed standing up from a chair using your arms (e.g., wheelchair or bedside chair)?: A Little Help needed to walk in hospital room?: Total (<20 ft) Help needed climbing 3-5 steps with a railing? : Total 6 Click Score: 13    End of Session Equipment Utilized During Treatment: Gait belt Activity Tolerance: Patient tolerated treatment well Patient left: in chair;with call bell/phone within reach;with chair alarm set Nurse Communication: Mobility status PT Visit Diagnosis: Repeated falls (R29.6);Muscle weakness (generalized) (M62.81);Difficulty in walking, not elsewhere classified (R26.2)    Time: 6213-0865 PT Time Calculation (min) (ACUTE ONLY): 36 min   Charges:   PT Evaluation $PT Eval Moderate Complexity: 1 Mod PT Treatments $Therapeutic Activity: 8-22 mins PT General Charges $$ ACUTE PT VISIT: 1 Visit  Jerolyn Center, PT Acute Rehabilitation Services  Office [phone]   Zena Amos 03/09/2023, 10:20 AM

## 2023-03-09 NOTE — Plan of Care (Signed)
  Problem: Education: Goal: Knowledge of disease or condition will improve Outcome: Progressing   Problem: Ischemic Stroke/TIA Tissue Perfusion: Goal: Complications of ischemic stroke/TIA will be minimized Outcome: Progressing   Problem: Health Behavior/Discharge Planning: Goal: Ability to manage health-related needs will improve Outcome: Progressing   

## 2023-03-09 NOTE — Progress Notes (Signed)
PROGRESS NOTE    Eric [name].  ZOX:[id number] DOB: January 13, 1928 DOA: 03/08/2023 PCP: [name], MD    Chief Complaint  Patient presents with   Weakness    Brief Narrative:   Eric [name]. is a 89 y.o. male with medical history significant of atrial fibrillation/flutter Eliquis, CAD s/p CABG, diet-controlled diabetes mellitus type 2, PVD s/p right BKA , and CKD stage III presents with right-sided weakness starting 3 days ago.  Normally ambulates with the use of a walker, but noted having progressive difficulty since symptoms started.  He noted weakness in his right arm as well as leg, overall the symptoms has been progressive over months, but with significant worsening over last few days, patient was admitted to the hospital for further workup.   Assessment & Plan:   Principal Problem:   CVA (cerebral vascular accident) (HCC) Active Problems:   ATRIAL FIBRILLATION   Chronic anticoagulation   Essential hypertension   Hyponatremia   Normocytic anemia   Chronic kidney disease (CKD), stage III (moderate) (HCC)   Type 2 diabetes mellitus (HCC)   Chronic coronary artery disease   PVD (peripheral vascular disease) (HCC)   Thrombocytopenia (HCC)   Hyperlipidemia   Right-sided hemiparesis -In the setting of severe compressive spinal stenosis -Neurology input greatly appreciated -Neurosurgery consulted, to evaluate today PT/OT consulted -recommendation for CIR   Chronic atrial fibrillation on chronic anticoagulation Patient appears to be rate controlled in atrial fibrillation.   -resumed on ELIQUIS   Essential hypertension Blood pressures elevated up to 164/66.   -Continue home blood pressure regimen   Hyponatremia Acute.  Sodium 130. Improved with IV fluids, it is 134 today.   Normocytic anemia Hemoglobin 11 g/dL which appears near baseline. -Continue to monitor   Chronic kidney disease stage IIIb Creatinine 1.19 appears around patient's  baseline. -Continue to monitor   Diet-controlled diabetes mellitus type 2 - Last available hemoglobin A1c was 5.8 on 11/16/2022.  Patient not on any medications for treatment. -Continue to monitor   Coronary artery disease Patient with prior CABG in 2000 and subsequent PCI.   Peripheral vascular disease S/p left BKA -Continue Trental   Thrombocytopenia - has been intermittently been low previously in the past..  No reports of bleeding.   Hyperlipidemia -Continue Crestor   BPH  -Continue finasteride and Flomax     DVT prophylaxis: Apixaban Code Status: Full Family Communication: none at bedside Disposition:      Consultants:  Neurology  Neurosurgery Dr Danie Binder   Subjective:  No significant events overnight he denies any significant changes in right-sided weakness.  Objective: Vitals:   03/09/23 0000 03/09/23 0400 03/09/23 0640 03/09/23 0800  BP: (!) 145/54 137/63 137/66   Pulse: 68 63 63   Resp: 16 17 20    Temp: 98.3 F (36.8 C)   97.6 F (36.4 C)  TempSrc: Oral   Oral  SpO2: 93% 92% 95%   Weight:      Height:        Intake/Output Summary (Last 24 hours) at 03/09/2023 1205 Last data filed at 03/09/2023 0930 Gross per 24 hour  Intake 881.99 ml  Output 700 ml  Net 181.99 ml   Filed Weights   03/08/23 1036  Weight: 72.6 kg    Examination:  Awake Alert, Oriented X 3, No new F.N deficits, Normal affect Symmetrical Chest wall movement, Good air movement bilaterally, CTAB RRR,No Gallops,Rubs or new Murmurs, No Parasternal Heave +ve B.Sounds, Abd Soft, No tenderness, No rebound -  guarding or rigidity. No Cyanosis, Clubbing or edema, No new Rash or bruise, patient with significant right-sided weakness    Data Reviewed: I have personally reviewed following labs and imaging studies  CBC: Recent Labs  Lab 03/08/23 1051 03/08/23 1137 03/09/23 0116  WBC 6.4  --  4.7  NEUTROABS 2.7  --   --   HGB 11.0* 11.6* 11.1*  HCT 35.0* 34.0* 33.7*  MCV 90.0   --  89.6  PLT 107*  --  100*    Basic Metabolic Panel: Recent Labs  Lab 03/08/23 1050 03/08/23 1137 03/09/23 0116  NA 130* 135 134*  K 3.7 3.6 3.5  CL 99 100 104  CO2 22  --  20*  GLUCOSE 106* 108* 81  BUN 16 16 15   CREATININE 1.19 1.20 1.04  CALCIUM 8.4*  --  8.1*    GFR: Estimated Creatinine Clearance: 39.4 mL/min (by C-G formula based on SCr of 1.04 mg/dL).  Liver Function Tests: Recent Labs  Lab 03/08/23 1050 03/09/23 0116  AST 56* 50*  ALT 32 29  ALKPHOS 53 48  BILITOT 0.7 0.6  PROT 6.1* 5.6*  ALBUMIN 3.0* 2.7*    CBG: Recent Labs  Lab 03/08/23 1045  GLUCAP 119*     No results found for this or any previous visit (from the past 240 hour(s)).       Radiology Studies: MR CERVICAL SPINE WO CONTRAST  Result Date: 03/09/2023 CLINICAL DATA:  Myelopathy, chronic, cervical spine. EXAM: MRI CERVICAL SPINE WITHOUT CONTRAST TECHNIQUE: Multiplanar, multisequence MR imaging of the cervical spine was performed. No intravenous contrast was administered. COMPARISON:  CT done yesterday. FINDINGS: Alignment: No malalignment. Vertebrae: No fracture or focal bone lesion. Degenerative endplate marrow changes, with mild edema at the C3-4 and C5-6 levels, which could relate to regional pain. Cord: No primary cord lesion. See below regarding stenosis from C3-4 through C5-6. Posterior Fossa, vertebral arteries, paraspinal tissues: Negative Disc levels: The foramen magnum is widely patent. There is ordinary mild osteoarthritis of the C1-2 articulation but no encroachment upon the neural structures. C2-3: Endplate osteophytes and bulging of the disc. Mild facet and ligamentous hypertrophy. Mild canal narrowing but no compressive effect upon the cord. Bilateral foraminal narrowing which could affect the C3 nerves. C3-4: Endplate osteophytes and central disc herniation. Severe spinal stenosis in the midline with AP diameter of the canal only 4 mm. Effacement of the subarachnoid space and  deformity of the cord. Bilateral foraminal stenosis could affect the C4 nerves. C4-5: Endplate osteophytes and central disc herniation. AP diameter of the canal in the midline as narrow as 6 mm. Slight indentation of the cord. Bilateral foraminal narrowing that could affect the C5 nerves. C5-6: Endplate osteophytes and bulging of the disc. Narrowing of the ventral subarachnoid space but no compressive effect upon the cord. AP diameter of the canal in the midline 7.5 mm. Bilateral foraminal stenosis that could affect either C6 nerve. C6-7: Mild bulging of the disc. No canal stenosis. Foraminal narrowing on the left that could affect the C7 nerve. C7-T1: Mild bulging of the disc. No central canal stenosis. No compressive foraminal narrowing. IMPRESSION: 1. Severe multifactorial spinal stenosis at C3-4 with AP diameter of the canal only 4 mm. Effacement of the subarachnoid space and deformity of the cord. No abnormal cord signal at this time however. None the less, this finding could result in compressive myelopathy. 2. Spinal stenosis at C4-5 and C5-6 with AP diameter of the canal only 6 mm at C4-5 and  7.5 mm at C5-6. 3. Foraminal stenosis that could cause neural compression bilaterally at C2-3, bilateral at C3-4, bilateral at C4-5, bilateral at C5-6 and on the left at C6-7. Electronically Signed   By: Paulina Fusi M.D.   On: 03/09/2023 10:13   MR BRAIN WO CONTRAST  Result Date: 03/08/2023 CLINICAL DATA:  Right lower extremity weakness EXAM: MRI HEAD WITHOUT CONTRAST TECHNIQUE: Multiplanar, multiecho pulse sequences of the brain and surrounding structures were obtained without intravenous contrast. COMPARISON:  None Available. FINDINGS: Brain: No acute infarct, mass effect or extra-axial collection. No acute or chronic hemorrhage. There is multifocal hyperintense T2-weighted signal within the white matter. Generalized volume loss. The midline structures are normal. Vascular: Occluded right vertebral artery. Skull  and upper cervical spine: Normal calvarium and skull base. Visualized upper cervical spine and soft tissues are normal. Sinuses/Orbits:No paranasal sinus fluid levels or advanced mucosal thickening. No mastoid or middle ear effusion. Normal orbits. IMPRESSION: 1. No acute intracranial abnormality. 2. Occluded right vertebral artery. 3. Findings of chronic small vessel ischemia and volume loss. Electronically Signed   By: Deatra Robinson M.D.   On: 03/08/2023 20:22   CT ANGIO HEAD NECK W WO CM  Result Date: 03/08/2023 CLINICAL DATA:  Right-sided weakness EXAM: CT ANGIOGRAPHY HEAD AND NECK WITH AND WITHOUT CONTRAST TECHNIQUE: Multidetector CT imaging of the head and neck was performed using the standard protocol during bolus administration of intravenous contrast. Multiplanar CT image reconstructions and MIPs were obtained to evaluate the vascular anatomy. Carotid stenosis measurements (when applicable) are obtained utilizing NASCET criteria, using the distal internal carotid diameter as the denominator. RADIATION DOSE REDUCTION: This exam was performed according to the departmental dose-optimization program which includes automated exposure control, adjustment of the mA and/or kV according to patient size and/or use of iterative reconstruction technique. CONTRAST:  75mL OMNIPAQUE IOHEXOL 350 MG/ML SOLN COMPARISON:  None Available. FINDINGS: CTA NECK FINDINGS SKELETON: There is no bony spinal canal stenosis. No lytic or blastic lesion. OTHER NECK: Normal pharynx, larynx and major salivary glands. No cervical lymphadenopathy. Unremarkable thyroid gland. UPPER CHEST: No pneumothorax or pleural effusion. No nodules or masses. AORTIC ARCH: There is calcific atherosclerosis of the aortic arch. Conventional 3 vessel aortic branching pattern. RIGHT CAROTID SYSTEM: Qualitatively severe stenosis of the proximal right ICA due to mixed density atherosclerosis. By NASCET criteria this measures approximately 80%. LEFT CAROTID  SYSTEM: Atherosclerotic calcification causes moderate stenosis of the distal common carotid artery. No stenosis of the internal carotid artery. VERTEBRAL ARTERIES: Left dominant configuration.The right vertebral artery is occluded along its entire course. The left vertebral artery is normal. CTA HEAD FINDINGS POSTERIOR CIRCULATION: --Vertebral arteries: Occluded right V4 segment. Bulky atherosclerosis with severe stenosis of the left V4 segment. --Inferior cerebellar arteries: Normal left. Right not clearly visualized. --Basilar artery: Mild atherosclerotic calcification without stenosis or occlusion. --Superior cerebellar arteries: Normal. --Posterior cerebral arteries (PCA): Normal. ANTERIOR CIRCULATION: --Intracranial internal carotid arteries: Bilateral atherosclerotic calcification causing moderate stenosis of the distal right cavernous segment. --Anterior cerebral arteries (ACA): Patent --Middle cerebral arteries (MCA): Normal. VENOUS SINUSES: As permitted by contrast timing, patent. ANATOMIC VARIANTS: None Review of the MIP images confirms the above findings. IMPRESSION: 1. No intracranial large vessel occlusion. 2. Qualitatively severe stenosis of the proximal right ICA due to mixed density atherosclerosis. 3. Occlusion of the right vertebral artery along its entire course, age indeterminate. 4. Severe stenosis of the left vertebral artery V4 segment. 5. Moderate stenosis of the distal right ICA cavernous segment. Aortic Atherosclerosis (ICD10-I70.0).  Electronically Signed   By: Deatra Robinson M.D.   On: 03/08/2023 20:17   ECHOCARDIOGRAM COMPLETE  Result Date: 03/08/2023    ECHOCARDIOGRAM REPORT   Patient Name:   Eric [name]. Date of Exam: 03/08/2023 Medical Rec #:  [id number]           Height:       65.0 in Accession #:    [id number]          Weight:       160.0 lb Date of Birth:  [birth year]           BSA:          1.799 m Patient Age:    89 years            BP:           147/89 mmHg Patient Gender:  M                   HR:           53 bpm. Exam Location:  Inpatient Procedure: 2D Echo, Cardiac Doppler and Color Doppler Indications:    Stroke I63.9  History:        Patient has prior history of Echocardiogram examinations, most                 recent 03/04/2021. CAD, PAD, Stroke and CKD, Arrythmias:Atrial                 Fibrillation; Risk Factors:Dyslipidemia, Diabetes, Non-Smoker,                 Hypertension and Sleep Apnea.  Sonographer:    Dondra Prader RVT RCS Referring Phys: [phone] RONDELL A SMITH IMPRESSIONS  1. Left ventricular ejection fraction, by estimation, is 55 to 60%. The left ventricle has normal function. The left ventricle has no regional wall motion abnormalities. There is moderate concentric left ventricular hypertrophy. Left ventricular diastolic parameters are consistent with Grade III diastolic dysfunction (restrictive).  2. Right ventricular systolic function is normal. The right ventricular size is moderately enlarged. There is mildly elevated pulmonary artery systolic pressure. The estimated right ventricular systolic pressure is 42.0 mmHg.  3. Left atrial size was severely dilated.  4. Right atrial size was severely dilated.  5. The mitral valve is grossly normal. Trivial mitral valve regurgitation. Moderate mitral annular calcification.  6. Tricuspid valve regurgitation is moderate.  7. The aortic valve is calcified. There is severe calcifcation of the aortic valve. There is moderate thickening of the aortic valve. Aortic valve regurgitation is mild. Aortic valve mean gradient measures 9.0 mmHg. Aortic valve Vmax measures 2.06 m/s.  8. The inferior vena cava is normal in size with greater than 50% respiratory variability, suggesting right atrial pressure of 3 mmHg. FINDINGS  Left Ventricle: Left ventricular ejection fraction, by estimation, is 55 to 60%. The left ventricle has normal function. The left ventricle has no regional wall motion abnormalities. The left ventricular internal  cavity size was normal in size. There is  moderate concentric left ventricular hypertrophy. Left ventricular diastolic function could not be evaluated due to atrial fibrillation. Left ventricular diastolic parameters are consistent with Grade III diastolic dysfunction (restrictive). Right Ventricle: The right ventricular size is moderately enlarged. No increase in right ventricular wall thickness. Right ventricular systolic function is normal. There is mildly elevated pulmonary artery systolic pressure. The tricuspid regurgitant velocity is 3.04 m/s, and with an assumed right atrial pressure of 5 mmHg, the estimated  right ventricular systolic pressure is 42.0 mmHg. Left Atrium: Left atrial size was severely dilated. Right Atrium: Right atrial size was severely dilated. Pericardium: There is no evidence of pericardial effusion. Mitral Valve: The mitral valve is grossly normal. There is mild calcification of the mitral valve leaflet(s). Moderate mitral annular calcification. Trivial mitral valve regurgitation. Tricuspid Valve: The tricuspid valve is normal in structure. Tricuspid valve regurgitation is moderate. Aortic Valve: The aortic valve is calcified. There is severe calcifcation of the aortic valve. There is moderate thickening of the aortic valve. There is moderate aortic valve annular calcification. Aortic valve regurgitation is mild. Aortic valve mean gradient measures 9.0 mmHg. Aortic valve peak gradient measures 16.9 mmHg. Aortic valve area, by VTI measures 2.27 cm. Pulmonic Valve: The pulmonic valve was normal in structure. Pulmonic valve regurgitation is trivial. Aorta: The aortic root and ascending aorta are structurally normal, with no evidence of dilitation. Venous: The inferior vena cava is normal in size with greater than 50% respiratory variability, suggesting right atrial pressure of 3 mmHg. IAS/Shunts: No atrial level shunt detected by color flow Doppler.  LEFT VENTRICLE PLAX 2D LVIDd:          4.70 cm   Diastology LVIDs:         3.20 cm   LV e' medial:    7.40 cm/s LV PW:         1.20 cm   LV E/e' medial:  17.7 LV IVS:        1.30 cm   LV e' lateral:   13.95 cm/s LVOT diam:     1.90 cm   LV E/e' lateral: 9.4 LV SV:         96 LV SV Index:   53 LVOT Area:     2.84 cm  RIGHT VENTRICLE             IVC RV Basal diam:  4.20 cm     IVC diam: 1.60 cm RV S prime:     13.50 cm/s TAPSE (M-mode): 1.9 cm LEFT ATRIUM              Index        RIGHT ATRIUM           Index LA diam:        5.40 cm  3.00 cm/m   RA Area:     38.20 cm LA Vol (A2C):   155.0 ml 86.15 ml/m  RA Volume:   149.00 ml 82.82 ml/m LA Vol (A4C):   130.0 ml 72.26 ml/m LA Biplane Vol: 157.0 ml 87.26 ml/m  AORTIC VALVE                     PULMONIC VALVE AV Area (Vmax):    2.19 cm      PV Vmax:       1.13 m/s AV Area (Vmean):   2.15 cm      PV Peak grad:  5.1 mmHg AV Area (VTI):     2.27 cm AV Vmax:           205.67 cm/s AV Vmean:          140.000 cm/s AV VTI:            0.422 m AV Peak Grad:      16.9 mmHg AV Mean Grad:      9.0 mmHg LVOT Vmax:         159.00 cm/s LVOT Vmean:  106.333 cm/s LVOT VTI:          0.337 m LVOT/AV VTI ratio: 0.80 AR Vena Contracta: 0.80 cm  AORTA Ao Root diam: 3.60 cm Ao Asc diam:  3.60 cm MITRAL VALVE                TRICUSPID VALVE MV Area (PHT): 4.43 cm     TR Peak grad:   37.0 mmHg MV Decel Time: 171 msec     TR Vmax:        304.00 cm/s MV E velocity: 130.67 cm/s MV A velocity: 46.00 cm/s   SHUNTS MV E/A ratio:  2.84         Systemic VTI:  0.34 m                             Systemic Diam: 1.90 cm Aditya Sabharwal Electronically signed by Dorthula Nettles Signature Date/Time: 03/08/2023/5:39:27 PM    Final    CT HEAD WO CONTRAST  Result Date: 03/08/2023 CLINICAL DATA:  Stroke suspected EXAM: CT HEAD WITHOUT CONTRAST TECHNIQUE: Contiguous axial images were obtained from the base of the skull through the vertex without intravenous contrast. RADIATION DOSE REDUCTION: This exam was performed according to the  departmental dose-optimization program which includes automated exposure control, adjustment of the mA and/or kV according to patient size and/or use of iterative reconstruction technique. COMPARISON:  None Available. FINDINGS: Brain: No evidence of acute infarction, hemorrhage, hydrocephalus, extra-axial collection or mass lesion/mass effect. Periventricular white matter hypodensity. Vascular: No hyperdense vessel or unexpected calcification. Skull: Normal. Negative for fracture or focal lesion. Sinuses/Orbits: No acute finding. Other: None. IMPRESSION: No acute intracranial pathology. Small-vessel white matter disease. MRI may be used to more sensitively evaluate for acute infarction if clinically suspected. Electronically Signed   By: Jearld Lesch M.D.   On: 03/08/2023 12:47        Scheduled Meds:  amLODipine  10 mg Oral Daily   apixaban  2.5 mg Oral BID   atenolol  12.5 mg Oral Daily   finasteride  5 mg Oral Daily   fluticasone  1 spray Each Nare Daily   hydrALAZINE  75 mg Oral BID   pentoxifylline  400 mg Oral Q12H   rosuvastatin  40 mg Oral Daily   tamsulosin  0.8 mg Oral QPC supper   Continuous Infusions:   LOS: 0 days     Huey Bienenstock, MD [facility]   To contact the attending provider between 7A-7P or the covering provider during after hours 7P-7A, please log into the web site [email or web address] and access using universal [place] password for that web site. If you do not have the password, please call the hospital operator.  03/09/2023, 12:05 PM

## 2023-03-09 NOTE — Consult Note (Signed)
Physical Medicine and Rehabilitation Consult Reason for Consult:CIR/acute rehab Referring Physician: Dr Huey Bienenstock   HPI: Eric [name]. is a 89 y.o. male with hx of Afib, on Eliquis, HTN, HOLD, DM, CKD3B, CAD s/p CABG in 2000, and PCI since then; OSA on CPAP; PVD s/p L BKA 5 years ago who was admitted after recent fall when developed R sided hemiparesis- it wa sin last week, but admitted has been getting progressively weaker all over for a couple of months, but MUCH worse just for last week since fell.  Came into hospital 7/29- was seen by Neurology after scan of brain was negative for stroke and scanned his cervical spine- found to have 4 MM of canal diameter at C3/4- with severe canal stenosis; also C4/5 had 6 MM and C5/6 was 7.5 mm- all were normally surgically treated, however due to his medical comorbidities and his advanced age, it was felt pt wasn't a good surgical candidate.  He was also found to have hyponatremia Usually walks with RW and L BKA prosthesis at home- still drives.    Review of Systems  Constitutional: Negative.   HENT:  Positive for hearing loss. Negative for congestion, ear discharge and nosebleeds.        Doesn't have hearing aids  Eyes: Negative.   Respiratory: Negative.    Cardiovascular:  Negative for chest pain, palpitations, orthopnea and leg swelling.  Gastrointestinal: Negative.   Genitourinary:  Positive for frequency. Negative for dysuria and hematuria.  Musculoskeletal:  Positive for back pain and neck pain.  Skin: Negative.   Neurological:  Positive for sensory change and weakness.  Endo/Heme/Allergies: Negative.   Psychiatric/Behavioral: Negative.    All other systems reviewed and are negative.  Past Medical History:  Diagnosis Date   Atrial flutter (HCC)    s/p RFCA   CAD (coronary artery disease)    cath 2003, occluded S-RCA, occluded S-Dx, L-LAD ok, s/p PTCA to LAD   Cataract    CKD (chronic kidney disease) stage 3, GFR  30-59 ml/min (HCC)    Diabetes mellitus    diet controlled   Hearing aid worn    bilateral   History of kidney stones    Long term (current) use of anticoagulants    Neuromuscular disorder (HCC)    OSA (obstructive sleep apnea) 12/11   very mild, AHI 7/hr   Persistent atrial fibrillation (HCC) 09/26/2015   Pure hyperglyceridemia    PVD (peripheral vascular disease) (HCC)    angioplasty of his right lower extremity in [place] by Dr.[name] 2013   Unspecified essential hypertension    Wears dentures    full upper and lower   Past Surgical History:  Procedure Laterality Date   ABDOMINAL AORTOGRAM W/LOWER EXTREMITY N/A 11/15/2017   Procedure: ABDOMINAL AORTOGRAM W/LOWER EXTREMITY;  Surgeon: Sherren Kerns, MD;  Location: [facility];  Service: Cardiovascular;  Laterality: N/A;   Adenosine Myoview  3/06   EF 56%, neg. Ischemia   Adenosine Myoview  02/18/07   nml   AMPUTATION Left 12/15/2017   Procedure: AMPUTATION BELOW KNEE;  Surgeon: Annice Needy, MD;  Location: [facility];  Service: Vascular;  Laterality: Left;   ANGIOPLASTY  1/99   CAD- diogonal with rotational artherectomy   Arthrectomy     of LAD & PTCA   BLEPHAROPLASTY Bilateral    CARDIAC CATHETERIZATION  1/00   CARDIOVERSION  1/04   CARDIOVERSION  5/07   hospital- a flutter   CARPAL  TUNNEL RELEASE     ? bilateral   CATARACT EXTRACTION W/PHACO Right 07/28/2017   Procedure: CATARACT EXTRACTION PHACO AND INTRAOCULAR LENS PLACEMENT (IOC) RIGHT DIABETIC;  Surgeon: Lockie Mola, MD;  Location: [facility];  Service: Ophthalmology;  Laterality: Right;  Diabetic - diet controlled   CATARACT EXTRACTION W/PHACO Left 08/18/2017   Procedure: CATARACT EXTRACTION PHACO AND INTRAOCULAR LENS PLACEMENT (IOC);  Surgeon: Lockie Mola, MD;  Location: [facility];  Service: Ophthalmology;  Laterality: Left;  DIABETES - oral meds   COLONOSCOPY W/ BIOPSIES  10/01/06   sigmoid polyp bx neg, 3 years   COLONOSCOPY  WITH PROPOFOL N/A 04/28/2021   Procedure: COLONOSCOPY WITH PROPOFOL;  Surgeon: Iva Boop, MD;  Location: [facility] ENDOSCOPY;  Service: Endoscopy;  Laterality: N/A;   CORONARY ANGIOPLASTY  4/03   cutting balloon PTCA pLAD into Diag   CORONARY ARTERY BYPASS GRAFT  2000   LIMA-LAD, SVG-RCA, SVG-Diag; SVG-Diag & SVG-RCA occluded 2003   FRACTURE SURGERY     HAND SURGERY  08/27/09   R thumb procedure wit Scaphoid Gragt and screws, Dr Merlyn Lot   LOWER EXTREMITY ANGIOGRAPHY Right 01/25/2019   Procedure: LOWER EXTREMITY ANGIOGRAPHY;  Surgeon: Renford Dills, MD;  Location: [facility];  Service: Cardiovascular;  Laterality: Right;   NM MYOVIEW LTD  4/11   normal   PERIPHERAL VASCULAR CATHETERIZATION Right 06/27/2015   Procedure: Lower Extremity Angiography;  Surgeon: Annice Needy, MD;  Location: [facility];  Service: Cardiovascular;  Laterality: Right;   PERIPHERAL VASCULAR CATHETERIZATION  06/27/2015   Procedure: Lower Extremity Intervention;  Surgeon: Annice Needy, MD;  Location: [facility];  Service: Cardiovascular;;   POLYPECTOMY  04/28/2021   Procedure: POLYPECTOMY;  Surgeon: Iva Boop, MD;  Location: [facility];  Service: Endoscopy;;   Family History  Problem Relation Age of Onset   Stroke Father    Aneurysm Father    Hypertension Father    Prostate cancer Brother    Hypertension Mother    Lung cancer Brother        smoker   Thrombosis Brother    Other Brother        RF valve disorder (smoker)   Lung cancer Brother        smoker   Breast cancer Sister    Liver cancer Brother    Other Sister        cerebral hemorrhage   Social History:  reports that he has never smoked. He quit smokeless tobacco use about 30 years ago. He reports that he does not drink alcohol and does not use drugs. Allergies:  Allergies  Allergen Reactions   Altace [Ramipril] Cough   Isosorbide Mononitrate Other (See Comments)    Unknown reaction   Quinidine Gluconate Rash    Medications Prior to Admission  Medication Sig Dispense Refill   acetaminophen (TYLENOL) 325 MG tablet Take 2 tablets (650 mg total) by mouth every 6 (six) hours as needed for mild pain (or temp >/= 101 F).     amLODipine (NORVASC) 10 MG tablet Take 1 tablet (10 mg total) by mouth daily. 90 tablet 1   apixaban (ELIQUIS) 2.5 MG TABS tablet Take 1 tablet (2.5 mg total) by mouth 2 (two) times daily. OFFICE VISIT NEEDED FOR ADDITIONAL REFILLS 180 tablet 1   atenolol (TENORMIN) 25 MG tablet Take 1/2 tablet (12.5 mg total) by mouth daily. 15 tablet 3   bethanechol (URECHOLINE) 10 MG tablet Take 0.5 tablets (5  mg total) by mouth 3 (three) times daily. 45 tablet 1   Cinnamon 500 MG TABS Take 500 mg by mouth daily.     Cranberry 500 MG CAPS Take 500 mg by mouth daily.     cyanocobalamin (VITAMIN B12) 500 MCG tablet Take 500 mcg by mouth daily.     ferrous sulfate 325 (65 FE) MG EC tablet Take 325 mg by mouth at bedtime.     finasteride (PROSCAR) 5 MG tablet TAKE 1 TABLET BY MOUTH ONCE DAILY (Patient taking differently: Take 5 mg by mouth daily.) 90 tablet 3   fluticasone (FLONASE) 50 MCG/ACT nasal spray Place 1 spray into both nostrils daily.  2   furosemide (LASIX) 40 MG tablet Take 40 mg by mouth daily.     hydrALAZINE (APRESOLINE) 25 MG tablet TAKE 3 TABLETS BY MOUTH IN THE MORNING AND AT BEDTIME (Patient taking differently: Take 75 mg by mouth 2 (two) times daily.) 180 tablet 2   Multiple Vitamin (DAILY MULTIVITAMIN PO) Take 1 tablet by mouth daily.     pentoxifylline (TRENTAL) 400 MG CR tablet TAKE 1 TABLET BY MOUTH EVERY 12 HOURS . (Patient taking differently: Take 400 mg by mouth every 12 (twelve) hours. TAKE 1 TABLET BY MOUTH EVERY 12 HOURS .) 180 tablet 1   rosuvastatin (CRESTOR) 40 MG tablet Take 1 tablet (40 mg total) by mouth daily. 90 tablet 1   tamsulosin (FLOMAX) 0.4 MG CAPS capsule Take 2 capsules (0.8 mg total) by mouth daily after supper. 60 capsule 3   ONETOUCH ULTRA test strip USE 1  STRIP TO CHECK GLUCOSE ONCE DAILY (Patient taking differently: 1 each by Other route See admin instructions. USE 1 STRIP TO CHECK GLUCOSE ONCE DAILY) 50 each 0   temazepam (RESTORIL) 7.5 MG capsule Take 1 capsule (7.5 mg total) by mouth at bedtime as needed for sleep. (Patient not taking: Reported on 03/08/2023) 15 capsule 0    Home: Home Living Family/patient expects to be discharged to:: Private residence Living Arrangements: Spouse/significant other Available Help at Discharge: Family Type of Home: House Home Access: Ramped entrance, Level entry Home Layout: Two level Alternate Level Stairs-Number of Steps: stair lift from basement entrance (level entry) - this is how pt usually enters/exits the home Bathroom Shower/Tub: Walk-in shower Bathroom Toilet: Handicapped height Bathroom Accessibility: Yes Home Equipment: Agricultural consultant (2 wheels), Rollator (4 wheels), Wheelchair - manual, Wheelchair - power, Shower seat, Hand held shower head, Grab bars - tub/shower, Other (comment) (lift chair; rolling shower chair)  Lives With: Spouse  Functional History: Prior Function Prior Level of Function : Independent/Modified Independent Mobility Comments: uses cane and prosthetic primarily for mobility. use of RW when out in community (keeps in car) ADLs Comments: MOD I for ADLs, goes out to eat with wife, drives, does some cooking at home. family assist with groceries more recently, med mgmt for pt and his spouse Functional Status:  Mobility: Bed Mobility Overal bed mobility: Needs Assistance Bed Mobility: Rolling, Sidelying to Sit Rolling: Min assist Sidelying to sit: Mod assist, HOB elevated General bed mobility comments: in recliner on entry Transfers Overall transfer level: Needs assistance Equipment used: Rolling walker (2 wheels) Transfers: Sit to/from Stand, Bed to chair/wheelchair/BSC Sit to Stand: Min assist, Min guard Bed to/from chair/wheelchair/BSC transfer type:: Step  pivot Step pivot transfers: Min guard General transfer comment: Min A from recliner, min guard from [facility] pushing from armrests Ambulation/Gait General Gait Details: deferred due to degree of RLE weakness; pivotal steps to chair only  ADL: ADL Overall ADL's : Needs assistance/impaired Eating/Feeding: Set up, Sitting, Minimal assistance Eating/Feeding Details (indicate cue type and reason): some issues attempting to use L UE for task as R dominant UE with difficulty maintaining grasp Grooming: Minimal assistance, Sitting Upper Body Bathing: Minimal assistance, Sitting Lower Body Bathing: Moderate assistance, Sit to/from stand Upper Body Dressing : Minimal assistance, Sitting Lower Body Dressing: Moderate assistance, Sit to/from stand Toilet Transfer: Minimal assistance, Stand-pivot, BSC/3in1, Rolling walker (2 wheels) Toilet Transfer Details (indicate cue type and reason): minor cues for sequencing, standing from recliner with assist but able to stand from [facility] pushing from armrests fairly easily Toileting- Clothing Manipulation and Hygiene: Sit to/from stand, Moderate assistance Toileting - Clothing Manipulation Details (indicate cue type and reason): assisting with clothing mgmt but required assist for posterior hygiene in standing  Cognition: Cognition Overall Cognitive Status: Within Functional Limits for tasks assessed Arousal/Alertness: Awake/alert Orientation Level: Oriented X4 Cognition Arousal/Alertness: Awake/alert Behavior During Therapy: WFL for tasks assessed/performed Overall Cognitive Status: Within Functional Limits for tasks assessed  Blood pressure 137/66, pulse 63, temperature 97.6 F (36.4 C), temperature source Oral, resp. rate 20, height 5\' 5"  (1.651 m), weight 72.6 kg, SpO2 95%. Physical Exam Vitals and nursing note reviewed. Exam conducted with a chaperone present.  Constitutional:      General: He is not in acute distress.    Comments: Pt sitting up in  bedside chair; awake, alert, appropriate, very HOH, son in room, NAD  HENT:     Head: Normocephalic and atraumatic.     Right Ear: External ear normal.     Left Ear: External ear normal.     Nose: Nose normal.     Mouth/Throat:     Mouth: Mucous membranes are dry.     Pharynx: Oropharynx is clear. No oropharyngeal exudate.  Eyes:     General:        Right eye: No discharge.        Left eye: No discharge.     Extraocular Movements: Extraocular movements intact.  Cardiovascular:     Rate and Rhythm: Normal rate. Rhythm irregular.     Heart sounds: Normal heart sounds. No murmur heard.    No gallop.  Pulmonary:     Effort: Pulmonary effort is normal. No respiratory distress.     Breath sounds: Normal breath sounds. No wheezing, rhonchi or rales.  Abdominal:     General: Bowel sounds are normal. There is no distension.     Palpations: Abdomen is soft.     Tenderness: There is no abdominal tenderness.  Musculoskeletal:     Cervical back: Tenderness present.     Comments: RUE- Deltoid 2/5; Biceps 2/5; Triceps 2+/5; Grip 3/5; and FA 4+/5 LUE 4+/5 in same muscles RLE- 2/5 in HF, KE, DF and PF LLE- HF 4+/5 and KE/KF 4+/5- has L BKA with prosthesis off  Skin:    General: Skin is warm and dry.     Comments: No skin breakdown seen  Neurological:     Mental Status: He is alert.     Comments: Decreased to light touch from C3 to T1 on RUE and decreased below Knee on R_ but otherwise intact on R torso and LUE and L torso- and L thigh  Psychiatric:        Mood and Affect: Mood normal.        Behavior: Behavior normal.     Results for orders placed or performed during the hospital encounter of 03/08/23 (from the  past 24 hour(s))  CBC     Status: Abnormal   Collection Time: 03/09/23  1:16 AM  Result Value Ref Range   WBC 4.7 4.0 - 10.5 K/uL   RBC 3.76 (L) 4.22 - 5.81 MIL/uL   Hemoglobin 11.1 (L) 13.0 - 17.0 g/dL   HCT 32.4 (L) 40.1 - 02.7 %   MCV 89.6 80.0 - 100.0 fL   MCH 29.5 26.0  - 34.0 pg   MCHC 32.9 30.0 - 36.0 g/dL   RDW 25.3 (H) 66.4 - 40.3 %   Platelets 100 (L) 150 - 400 K/uL   nRBC 0.0 0.0 - 0.2 %  Comprehensive metabolic panel     Status: Abnormal   Collection Time: 03/09/23  1:16 AM  Result Value Ref Range   Sodium 134 (L) 135 - 145 mmol/L   Potassium 3.5 3.5 - 5.1 mmol/L   Chloride 104 98 - 111 mmol/L   CO2 20 (L) 22 - 32 mmol/L   Glucose, Bld 81 70 - 99 mg/dL   BUN 15 8 - 23 mg/dL   Creatinine, Ser 4.74 0.61 - 1.24 mg/dL   Calcium 8.1 (L) 8.9 - 10.3 mg/dL   Total Protein 5.6 (L) 6.5 - 8.1 g/dL   Albumin 2.7 (L) 3.5 - 5.0 g/dL   AST 50 (H) 15 - 41 U/L   ALT 29 0 - 44 U/L   Alkaline Phosphatase 48 38 - 126 U/L   Total Bilirubin 0.6 0.3 - 1.2 mg/dL   GFR, Estimated >25 >95 mL/min   Anion gap 10 5 - 15   *Note: Due to a large number of results and/or encounters for the requested time period, some results have not been displayed. A complete set of results can be found in Results Review.   MR CERVICAL SPINE WO CONTRAST  Result Date: 03/09/2023 CLINICAL DATA:  Myelopathy, chronic, cervical spine. EXAM: MRI CERVICAL SPINE WITHOUT CONTRAST TECHNIQUE: Multiplanar, multisequence MR imaging of the cervical spine was performed. No intravenous contrast was administered. COMPARISON:  CT done yesterday. FINDINGS: Alignment: No malalignment. Vertebrae: No fracture or focal bone lesion. Degenerative endplate marrow changes, with mild edema at the C3-4 and C5-6 levels, which could relate to regional pain. Cord: No primary cord lesion. See below regarding stenosis from C3-4 through C5-6. Posterior Fossa, vertebral arteries, paraspinal tissues: Negative Disc levels: The foramen magnum is widely patent. There is ordinary mild osteoarthritis of the C1-2 articulation but no encroachment upon the neural structures. C2-3: Endplate osteophytes and bulging of the disc. Mild facet and ligamentous hypertrophy. Mild canal narrowing but no compressive effect upon the cord. Bilateral  foraminal narrowing which could affect the C3 nerves. C3-4: Endplate osteophytes and central disc herniation. Severe spinal stenosis in the midline with AP diameter of the canal only 4 mm. Effacement of the subarachnoid space and deformity of the cord. Bilateral foraminal stenosis could affect the C4 nerves. C4-5: Endplate osteophytes and central disc herniation. AP diameter of the canal in the midline as narrow as 6 mm. Slight indentation of the cord. Bilateral foraminal narrowing that could affect the C5 nerves. C5-6: Endplate osteophytes and bulging of the disc. Narrowing of the ventral subarachnoid space but no compressive effect upon the cord. AP diameter of the canal in the midline 7.5 mm. Bilateral foraminal stenosis that could affect either C6 nerve. C6-7: Mild bulging of the disc. No canal stenosis. Foraminal narrowing on the left that could affect the C7 nerve. C7-T1: Mild bulging of the disc. No central canal  stenosis. No compressive foraminal narrowing. IMPRESSION: 1. Severe multifactorial spinal stenosis at C3-4 with AP diameter of the canal only 4 mm. Effacement of the subarachnoid space and deformity of the cord. No abnormal cord signal at this time however. None the less, this finding could result in compressive myelopathy. 2. Spinal stenosis at C4-5 and C5-6 with AP diameter of the canal only 6 mm at C4-5 and 7.5 mm at C5-6. 3. Foraminal stenosis that could cause neural compression bilaterally at C2-3, bilateral at C3-4, bilateral at C4-5, bilateral at C5-6 and on the left at C6-7. Electronically Signed   By: Paulina Fusi M.D.   On: 03/09/2023 10:13   MR BRAIN WO CONTRAST  Result Date: 03/08/2023 CLINICAL DATA:  Right lower extremity weakness EXAM: MRI HEAD WITHOUT CONTRAST TECHNIQUE: Multiplanar, multiecho pulse sequences of the brain and surrounding structures were obtained without intravenous contrast. COMPARISON:  None Available. FINDINGS: Brain: No acute infarct, mass effect or extra-axial  collection. No acute or chronic hemorrhage. There is multifocal hyperintense T2-weighted signal within the white matter. Generalized volume loss. The midline structures are normal. Vascular: Occluded right vertebral artery. Skull and upper cervical spine: Normal calvarium and skull base. Visualized upper cervical spine and soft tissues are normal. Sinuses/Orbits:No paranasal sinus fluid levels or advanced mucosal thickening. No mastoid or middle ear effusion. Normal orbits. IMPRESSION: 1. No acute intracranial abnormality. 2. Occluded right vertebral artery. 3. Findings of chronic small vessel ischemia and volume loss. Electronically Signed   By: Deatra Robinson M.D.   On: 03/08/2023 20:22   CT ANGIO HEAD NECK W WO CM  Result Date: 03/08/2023 CLINICAL DATA:  Right-sided weakness EXAM: CT ANGIOGRAPHY HEAD AND NECK WITH AND WITHOUT CONTRAST TECHNIQUE: Multidetector CT imaging of the head and neck was performed using the standard protocol during bolus administration of intravenous contrast. Multiplanar CT image reconstructions and MIPs were obtained to evaluate the vascular anatomy. Carotid stenosis measurements (when applicable) are obtained utilizing NASCET criteria, using the distal internal carotid diameter as the denominator. RADIATION DOSE REDUCTION: This exam was performed according to the departmental dose-optimization program which includes automated exposure control, adjustment of the mA and/or kV according to patient size and/or use of iterative reconstruction technique. CONTRAST:  75mL OMNIPAQUE IOHEXOL 350 MG/ML SOLN COMPARISON:  None Available. FINDINGS: CTA NECK FINDINGS SKELETON: There is no bony spinal canal stenosis. No lytic or blastic lesion. OTHER NECK: Normal pharynx, larynx and major salivary glands. No cervical lymphadenopathy. Unremarkable thyroid gland. UPPER CHEST: No pneumothorax or pleural effusion. No nodules or masses. AORTIC ARCH: There is calcific atherosclerosis of the aortic arch.  Conventional 3 vessel aortic branching pattern. RIGHT CAROTID SYSTEM: Qualitatively severe stenosis of the proximal right ICA due to mixed density atherosclerosis. By NASCET criteria this measures approximately 80%. LEFT CAROTID SYSTEM: Atherosclerotic calcification causes moderate stenosis of the distal common carotid artery. No stenosis of the internal carotid artery. VERTEBRAL ARTERIES: Left dominant configuration.The right vertebral artery is occluded along its entire course. The left vertebral artery is normal. CTA HEAD FINDINGS POSTERIOR CIRCULATION: --Vertebral arteries: Occluded right V4 segment. Bulky atherosclerosis with severe stenosis of the left V4 segment. --Inferior cerebellar arteries: Normal left. Right not clearly visualized. --Basilar artery: Mild atherosclerotic calcification without stenosis or occlusion. --Superior cerebellar arteries: Normal. --Posterior cerebral arteries (PCA): Normal. ANTERIOR CIRCULATION: --Intracranial internal carotid arteries: Bilateral atherosclerotic calcification causing moderate stenosis of the distal right cavernous segment. --Anterior cerebral arteries (ACA): Patent --Middle cerebral arteries (MCA): Normal. VENOUS SINUSES: As permitted by contrast timing, patent.  ANATOMIC VARIANTS: None Review of the MIP images confirms the above findings. IMPRESSION: 1. No intracranial large vessel occlusion. 2. Qualitatively severe stenosis of the proximal right ICA due to mixed density atherosclerosis. 3. Occlusion of the right vertebral artery along its entire course, age indeterminate. 4. Severe stenosis of the left vertebral artery V4 segment. 5. Moderate stenosis of the distal right ICA cavernous segment. Aortic Atherosclerosis (ICD10-I70.0). Electronically Signed   By: Deatra Robinson M.D.   On: 03/08/2023 20:17   ECHOCARDIOGRAM COMPLETE  Result Date: 03/08/2023    ECHOCARDIOGRAM REPORT   Patient Name:   Eric [name]. Date of Exam: 03/08/2023 Medical Rec #:   [id number]           Height:       65.0 in Accession #:    [id number]          Weight:       160.0 lb Date of Birth:  May 30, 1928           BSA:          1.799 m Patient Age:    89 years            BP:           147/89 mmHg Patient Gender: M                   HR:           53 bpm. Exam Location:  Inpatient Procedure: 2D Echo, Cardiac Doppler and Color Doppler Indications:    Stroke I63.9  History:        Patient has prior history of Echocardiogram examinations, most                 recent 03/04/2021. CAD, PAD, Stroke and CKD, Arrythmias:Atrial                 Fibrillation; Risk Factors:Dyslipidemia, Diabetes, Non-Smoker,                 Hypertension and Sleep Apnea.  Sonographer:    Dondra Prader RVT RCS Referring Phys: [phone] RONDELL A SMITH IMPRESSIONS  1. Left ventricular ejection fraction, by estimation, is 55 to 60%. The left ventricle has normal function. The left ventricle has no regional wall motion abnormalities. There is moderate concentric left ventricular hypertrophy. Left ventricular diastolic parameters are consistent with Grade III diastolic dysfunction (restrictive).  2. Right ventricular systolic function is normal. The right ventricular size is moderately enlarged. There is mildly elevated pulmonary artery systolic pressure. The estimated right ventricular systolic pressure is 42.0 mmHg.  3. Left atrial size was severely dilated.  4. Right atrial size was severely dilated.  5. The mitral valve is grossly normal. Trivial mitral valve regurgitation. Moderate mitral annular calcification.  6. Tricuspid valve regurgitation is moderate.  7. The aortic valve is calcified. There is severe calcifcation of the aortic valve. There is moderate thickening of the aortic valve. Aortic valve regurgitation is mild. Aortic valve mean gradient measures 9.0 mmHg. Aortic valve Vmax measures 2.06 m/s.  8. The inferior vena cava is normal in size with greater than 50% respiratory variability, suggesting right atrial  pressure of 3 mmHg. FINDINGS  Left Ventricle: Left ventricular ejection fraction, by estimation, is 55 to 60%. The left ventricle has normal function. The left ventricle has no regional wall motion abnormalities. The left ventricular internal cavity size was normal in size. There is  moderate concentric left ventricular hypertrophy. Left ventricular diastolic function  could not be evaluated due to atrial fibrillation. Left ventricular diastolic parameters are consistent with Grade III diastolic dysfunction (restrictive). Right Ventricle: The right ventricular size is moderately enlarged. No increase in right ventricular wall thickness. Right ventricular systolic function is normal. There is mildly elevated pulmonary artery systolic pressure. The tricuspid regurgitant velocity is 3.04 m/s, and with an assumed right atrial pressure of 5 mmHg, the estimated right ventricular systolic pressure is 42.0 mmHg. Left Atrium: Left atrial size was severely dilated. Right Atrium: Right atrial size was severely dilated. Pericardium: There is no evidence of pericardial effusion. Mitral Valve: The mitral valve is grossly normal. There is mild calcification of the mitral valve leaflet(s). Moderate mitral annular calcification. Trivial mitral valve regurgitation. Tricuspid Valve: The tricuspid valve is normal in structure. Tricuspid valve regurgitation is moderate. Aortic Valve: The aortic valve is calcified. There is severe calcifcation of the aortic valve. There is moderate thickening of the aortic valve. There is moderate aortic valve annular calcification. Aortic valve regurgitation is mild. Aortic valve mean gradient measures 9.0 mmHg. Aortic valve peak gradient measures 16.9 mmHg. Aortic valve area, by VTI measures 2.27 cm. Pulmonic Valve: The pulmonic valve was normal in structure. Pulmonic valve regurgitation is trivial. Aorta: The aortic root and ascending aorta are structurally normal, with no evidence of dilitation.  Venous: The inferior vena cava is normal in size with greater than 50% respiratory variability, suggesting right atrial pressure of 3 mmHg. IAS/Shunts: No atrial level shunt detected by color flow Doppler.  LEFT VENTRICLE PLAX 2D LVIDd:         4.70 cm   Diastology LVIDs:         3.20 cm   LV e' medial:    7.40 cm/s LV PW:         1.20 cm   LV E/e' medial:  17.7 LV IVS:        1.30 cm   LV e' lateral:   13.95 cm/s LVOT diam:     1.90 cm   LV E/e' lateral: 9.4 LV SV:         96 LV SV Index:   53 LVOT Area:     2.84 cm  RIGHT VENTRICLE             IVC RV Basal diam:  4.20 cm     IVC diam: 1.60 cm RV S prime:     13.50 cm/s TAPSE (M-mode): 1.9 cm LEFT ATRIUM              Index        RIGHT ATRIUM           Index LA diam:        5.40 cm  3.00 cm/m   RA Area:     38.20 cm LA Vol (A2C):   155.0 ml 86.15 ml/m  RA Volume:   149.00 ml 82.82 ml/m LA Vol (A4C):   130.0 ml 72.26 ml/m LA Biplane Vol: 157.0 ml 87.26 ml/m  AORTIC VALVE                     PULMONIC VALVE AV Area (Vmax):    2.19 cm      PV Vmax:       1.13 m/s AV Area (Vmean):   2.15 cm      PV Peak grad:  5.1 mmHg AV Area (VTI):     2.27 cm AV Vmax:  205.67 cm/s AV Vmean:          140.000 cm/s AV VTI:            0.422 m AV Peak Grad:      16.9 mmHg AV Mean Grad:      9.0 mmHg LVOT Vmax:         159.00 cm/s LVOT Vmean:        106.333 cm/s LVOT VTI:          0.337 m LVOT/AV VTI ratio: 0.80 AR Vena Contracta: 0.80 cm  AORTA Ao Root diam: 3.60 cm Ao Asc diam:  3.60 cm MITRAL VALVE                TRICUSPID VALVE MV Area (PHT): 4.43 cm     TR Peak grad:   37.0 mmHg MV Decel Time: 171 msec     TR Vmax:        304.00 cm/s MV E velocity: 130.67 cm/s MV A velocity: 46.00 cm/s   SHUNTS MV E/A ratio:  2.84         Systemic VTI:  0.34 m                             Systemic Diam: 1.90 cm Aditya Sabharwal Electronically signed by Dorthula Nettles Signature Date/Time: 03/08/2023/5:39:27 PM    Final    CT HEAD WO CONTRAST  Result Date: 03/08/2023 CLINICAL  DATA:  Stroke suspected EXAM: CT HEAD WITHOUT CONTRAST TECHNIQUE: Contiguous axial images were obtained from the base of the skull through the vertex without intravenous contrast. RADIATION DOSE REDUCTION: This exam was performed according to the departmental dose-optimization program which includes automated exposure control, adjustment of the mA and/or kV according to patient size and/or use of iterative reconstruction technique. COMPARISON:  None Available. FINDINGS: Brain: No evidence of acute infarction, hemorrhage, hydrocephalus, extra-axial collection or mass lesion/mass effect. Periventricular white matter hypodensity. Vascular: No hyperdense vessel or unexpected calcification. Skull: Normal. Negative for fracture or focal lesion. Sinuses/Orbits: No acute finding. Other: None. IMPRESSION: No acute intracranial pathology. Small-vessel white matter disease. MRI may be used to more sensitively evaluate for acute infarction if clinically suspected. Electronically Signed   By: Jearld Lesch M.D.   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 Yes Does the patient require coordinated care of a physician, rehab nurse, therapy disciplines of PT and OT to address physical and functional deficits in the context of the above medical diagnosis(es)? Yes Addressing deficits in the following areas: balance,  endurance, locomotion, strength, transferring, bowel/bladder control, bathing, dressing, feeding, grooming, and toileting Can the patient actively participate in an intensive therapy program of at least 3 hrs of therapy per day at least 5 days per week? Yes The potential for patient to make measurable gains while on inpatient rehab is good Anticipated functional outcomes upon discharge from inpatient rehab are supervision  with PT, supervision and min assist with OT,  n/a with SLP. Estimated rehab length of stay to reach the above functional goals is: ~ 3 weeks Anticipated discharge destination: Home Overall Rehab/Functional Prognosis: good  RECOMMENDATIONS: This patient's condition is appropriate for continued rehabilitative care in the following setting: CIR Patient has agreed to participate in recommended program. Yes Note that insurance prior authorization may be required for reimbursement for recommended care.  Comment:  pt is a great candidate for CIR/acute rehab- he cannot get surgical intervention due to multiple medical comorbidities and has a high high risk of becoming a progressive quadriplegic if we don't work on strengthening him - he's been functioning at [facility] a high level, even after his hip fracture, that's it would be awful if he couldn't get the opportunity to get even close back to his prior level of function.  2. Might be worth seeing if can do a Prednisone taper for Sx's- to see if can decrease inflammation 3. Pt developing a hemiplegic presentation, but is due to severe cervical myelopathy- due to a recent fall- he needs acute CIR since hasn't had CIR for his incomplete quadriplegia-  4. Since having a lot of urinary hesitancy, will need bladder scans, to see if has neurogenic bladder.Will do in CIR.  5. Will submit for admission to CIR- for admissions coordinator-  6. Thanks for this consult- I know this patient well- took care of him after hip fracture- he's a HIGH level  functioning 89 year old- learned to walk with a BKA prosthesis at 89 yrs old and was still caring for his wife at home until hip fracture, and went BACK to it, after CIR admission- he's just a patient who pushes through almost everything- he needs the chance to do so here.     I spent a total of 66   minutes on total care today- >50% coordination of care- due to  Review of chart, including my notes from his admission to CIR last time; vitals and labs- and imaging- and interview and exam of pt- and typing up note Genice Rouge, MD 03/09/2023

## 2023-03-10 DIAGNOSIS — E785 Hyperlipidemia, unspecified: Secondary | ICD-10-CM | POA: Diagnosis not present

## 2023-03-10 DIAGNOSIS — N1832 Chronic kidney disease, stage 3b: Secondary | ICD-10-CM | POA: Diagnosis not present

## 2023-03-10 DIAGNOSIS — I10 Essential (primary) hypertension: Secondary | ICD-10-CM | POA: Diagnosis not present

## 2023-03-10 DIAGNOSIS — I739 Peripheral vascular disease, unspecified: Secondary | ICD-10-CM | POA: Diagnosis not present

## 2023-03-10 NOTE — Telephone Encounter (Signed)
Requested Prescriptions  Pending Prescriptions Disp Refills   bethanechol (URECHOLINE) 10 MG tablet [Pharmacy Med Name: Bethanechol Chloride 10 MG Oral Tablet] 45 tablet 0    Sig: TAKE 1/2 (ONE-HALF) TABLET BY MOUTH THREE TIMES DAILY     Urology:  Bladder Agents Failed - 03/09/2023  9:42 AM      Failed - Valid encounter within last 12 months    Recent Outpatient Visits           1 year ago Wheezing   [facility] [name], NP   1 year ago Immunization due   [facility] Pickard, Priscille Heidelberg, MD   1 year ago Diverticulosis   [facility] [name], MD   2 years ago Type 2 diabetes mellitus with diabetic peripheral angiopathy with gangrene, unspecified whether long term insulin use (HCC)   [facility] [name], MD   2 years ago Hematuria, unspecified type   [facility] [name], NP       Future Appointments             In 2 weeks Pricilla Riffle, MD [facility]

## 2023-03-10 NOTE — Progress Notes (Signed)
Inpatient Rehab Admissions Coordinator:   Insurance auth pending.  Will follow.   Estill Dooms, PT, DPT Admissions Coordinator [phone] 03/10/23  11:08 AM

## 2023-03-10 NOTE — Progress Notes (Signed)
Physical Therapy Treatment Patient Details Name: Eric [name]. MRN: [id number] DOB: 11-Nov-1927 Today's Date: 03/10/2023   History of Present Illness 89 y.o. male presents 03/08/23 with right-sided weakness starting 3 days ago. MRI brain negative for acute stroke; MRI C spine showing multi level spinal stenosis though no abnormal cord signal change. PMH significant of atrial fibrillation/flutter Eliquis, CAD s/p CABG, diet-controlled diabetes mellitus type 2, PVD s/p right BKA , and CKD stage III    PT Comments  Pt received in recliner, pleasantly agreeable to therapy session, with improved tolerance for gait training this session. Pt with much improved RUE and RLE strength but still having difficulty with fine motor coordination on R hand, pt unable to doff his prosthetic unassisted. Pt needing up to minA for transfers and min guard for gait with RW with emphasis on activity pacing and close VS monitoring, HR/SpO2 WFL on RA and no c/o dizziness. Pt continues to benefit from PT services to progress toward functional mobility goals.     If plan is discharge home, recommend the following: A lot of help with walking and/or transfers;Assistance with cooking/housework;Assist for transportation   Can travel by private vehicle        Equipment Recommendations  None recommended by PT    Recommendations for Other Services       Precautions / Restrictions Precautions Precautions: Fall Precaution Comments: 2 recent falls; states broke L hip 6-8 wks ago (non-surgical); 2 days PTA Rt knee gave out Restrictions Weight Bearing Restrictions: No     Mobility  Bed Mobility               General bed mobility comments: in recliner on entry    Transfers Overall transfer level: Needs assistance Equipment used: Rolling walker (2 wheels) Transfers: Sit to/from Stand, Bed to chair/wheelchair/BSC Sit to Stand: Min assist, Min guard           General transfer comment: Min A from chair  with no arm rests, pt heavily relying on locked chair frame to assist wtih BLE stability when standing from recliner with min guard assist; x2 from recliner, x1 from chair without arm rests    Ambulation/Gait Ambulation/Gait assistance: Min guard Gait Distance (Feet): 300 Feet (137ft, seated break, 360ft) Assistive device: Rolling walker (2 wheels) Gait Pattern/deviations: Step-through pattern, Decreased stride length       General Gait Details: pt with much improved standing/gait tolerance today, reviewed activity pacing and RPE scale, pt reports 3/10 modified RPE after first gait trial, and appears closer to 5-6/10 after second trial. Fair RW management, pt intermittently needing assist with RW in narrower spaces in the room. No buckling or LOB   Stairs             Wheelchair Mobility     Tilt Bed    Modified Rankin (Stroke Patients Only)       Balance Overall balance assessment: Needs assistance, History of Falls Sitting-balance support: No upper extremity supported, Feet supported Sitting balance-Leahy Scale: Good     Standing balance support: Bilateral upper extremity supported, Reliant on assistive device for balance Standing balance-Leahy Scale: Poor Standing balance comment: reliant on RW                            Cognition Arousal/Alertness: Awake/alert Behavior During Therapy: WFL for tasks assessed/performed Overall Cognitive Status: Within Functional Limits for tasks assessed  Exercises Other Exercises Other Exercises: standing hip flexion x10 reps ea prior to gait trial Other Exercises: seated RLE AROM: ankle pumps x10 reps    General Comments General comments (skin integrity, edema, etc.): PTA assisted him to doff prosthetic and check skin, no areas of redness or chafing observed; Reports normally can don/doff prosthesis himself, but unable to push unlock button in currently due  to RUE weakness. Pt needed assist to place sock over neoprene sleeve but placed neoprene sleeve himself.      Pertinent Vitals/Pain Pain Assessment Pain Assessment: No/denies pain     PT Goals (current goals can now be found in the care plan section) Acute Rehab PT Goals Patient Stated Goal: return home after rehab PT Goal Formulation: With patient Time For Goal Achievement: 03/23/23 Progress towards PT goals: Progressing toward goals    Frequency    Min 1X/week      PT Plan Current plan remains appropriate       AM-PAC PT "6 Clicks" Mobility   Outcome Measure  Help needed turning from your back to your side while in a flat bed without using bedrails?: A Little Help needed moving from lying on your back to sitting on the side of a flat bed without using bedrails?: A Little Help needed moving to and from a bed to a chair (including a wheelchair)?: A Little Help needed standing up from a chair using your arms (e.g., wheelchair or bedside chair)?: A Little Help needed to walk in hospital room?: A Little Help needed climbing 3-5 steps with a railing? : A Lot 6 Click Score: 17    End of Session Equipment Utilized During Treatment: Gait belt Activity Tolerance: Patient tolerated treatment well Patient left: in chair;with call bell/phone within reach;with chair alarm set Nurse Communication: Mobility status PT Visit Diagnosis: Repeated falls (R29.6);Muscle weakness (generalized) (M62.81);Difficulty in walking, not elsewhere classified (R26.2)     Time: 4098-1191 PT Time Calculation (min) (ACUTE ONLY): 42 min  Charges:    $Gait Training: 23-37 mins $Therapeutic Activity: 8-22 mins PT General Charges $$ ACUTE PT VISIT: 1 Visit                     Donell Sliwinski P., PTA Acute Rehabilitation Services Secure Chat Preferred 9a-5:30pm Office: [phone]    Dorathy Kinsman [facility] 03/10/2023, 7:26 PM

## 2023-03-10 NOTE — Plan of Care (Signed)
  Problem: Education: Goal: Knowledge of disease or condition will improve Outcome: Progressing   Problem: Ischemic Stroke/TIA Tissue Perfusion: Goal: Complications of ischemic stroke/TIA will be minimized Outcome: Progressing   Problem: Health Behavior/Discharge Planning: Goal: Ability to manage health-related needs will improve Outcome: Progressing   

## 2023-03-10 NOTE — Progress Notes (Addendum)
PROGRESS NOTE        PATIENT DETAILS Name: Eric [name]. Age: 89 y.o. Sex: male Date of Birth: 1928/02/19 Admit Date: 03/08/2023 Admitting Physician Starleen Arms, MD UJW:[id number], Priscille Heidelberg, MD  Brief Summary: Patient is a 89 y.o.  male with a history of atrial fibrillation on Eliquis, CAD s/p CABG, PAD-s/p left BKA, CKD stage IIIb, DM-2-who presented with right-sided weakness-upon further evaluation-patient was found to have severe compressive cervical spinal stenosis.  Significant events: 7/29>> admit to [facility]  Significant studies: 7/29>> CT head: No acute intracranial pathology 7/29>> MRI brain: No acute CVA. 7/29>> CT angio head/neck: No LVO, severe stenosis of proximal right ICA due to mixed density atherosclerosis, occlusion of right vertebral artery, severe stenosis of left vertebral artery V4 segment. 7/29>> echo: EF 55-60%, grade 3 diastolic dysfunction. 7/29>> LDL: 39 7/29>> A1c: 5.6 7/30>> MRI C-spine: Severe multifactorial spinal stenosis.  Significant microbiology data: None  Procedures: None  Consults: Neurology CIR  Subjective: Lying comfortably in bed-denies any chest pain or shortness of breath.  Claims he is not able to move his right upper extremity much more freely compared to yesterday.  Objective: Vitals: Blood pressure (!) 124/50, pulse (!) 55, temperature 98.1 F (36.7 C), temperature source Oral, resp. rate (!) 21, height 5\' 5"  (1.651 m), weight 72.6 kg, SpO2 97%.   Exam: Gen Exam:Alert awake-not in any distress HEENT:atraumatic, normocephalic Chest: B/L clear to auscultation anteriorly CVS:S1S2 regular Abdomen:soft non tender, non distended Extremities:no edema Neurology: Slight right upper extremity/right lower extremity weakness-but per patient report better than yesterday. Skin: no rash  Pertinent Labs/Radiology:    Latest Ref Rng & Units 03/10/2023    1:40 AM 03/09/2023    1:16 AM 03/08/2023   11:37  AM  CBC  WBC 4.0 - 10.5 K/uL 6.0  4.7    Hemoglobin 13.0 - 17.0 g/dL 29.5  62.1  30.8   Hematocrit 39.0 - 52.0 % 33.3  33.7  34.0   Platelets 150 - 400 K/uL 93  100      Lab Results  Component Value Date   NA 135 03/10/2023   K 4.1 03/10/2023   CL 103 03/10/2023   CO2 20 (L) 03/10/2023      Assessment/Plan: Severe compressive cervical spinal stenosis causing right-sided hemiparesis Evaluated by neurology and then by neurosurgery-not a candidate for decompressive surgery Per patient report-significant improvement in his right upper extremity/right lower extremity strength today Continue PT/OT-CIR evaluation ongoing  Chronic atrial fibrillation Rate controlled Eliquis  HTN BP stable Continue amlodipine, atenolol, hydralazine  CAD s/p CABG in 2000 with subsequent PCI in 2020 PAD s/p left BKA Stable Not on antiplatelets due to patient being on Eliquis Remains on statin/Trental  DM-2 Diet controlled CBG stable  Recent Labs    03/08/23 1045  GLUCAP 119*     CKD stage IIIb At baseline  BPH Flomax/finasteride  BMI: Estimated body mass index is 26.63 kg/m as calculated from the following:   Height as of this encounter: 5\' 5"  (1.651 m).   Weight as of this encounter: 72.6 kg.   Code status:   Code Status: Full Code   DVT Prophylaxis: apixaban (ELIQUIS) tablet 2.5 mg Start: 03/09/23 1000 Place and maintain sequential compression device Start: 03/08/23 1917 apixaban (ELIQUIS) tablet 2.5 mg    Family Communication: None at bedside   Disposition Plan:  Status is: Inpatient Remains inpatient appropriate because: Severity of illness   Planned Discharge Destination:Rehabilitation facility   Diet: Diet Order             Diet Heart Room service appropriate? Yes; Fluid consistency: Thin  Diet effective now                     Antimicrobial agents: Anti-infectives (From admission, onward)    None        MEDICATIONS: Scheduled Meds:   amLODipine  10 mg Oral Daily   apixaban  2.5 mg Oral BID   atenolol  12.5 mg Oral Daily   finasteride  5 mg Oral Daily   fluticasone  1 spray Each Nare Daily   hydrALAZINE  75 mg Oral BID   pentoxifylline  400 mg Oral Q12H   rosuvastatin  40 mg Oral Daily   tamsulosin  0.8 mg Oral QPC supper   Continuous Infusions: PRN Meds:.acetaminophen **OR** acetaminophen (TYLENOL) oral liquid 160 mg/5 mL **OR** acetaminophen, senna-docusate   I have personally reviewed following labs and imaging studies  LABORATORY DATA: CBC: Recent Labs  Lab 03/08/23 1051 03/08/23 1137 03/09/23 0116 03/10/23 0140  WBC 6.4  --  4.7 6.0  NEUTROABS 2.7  --   --   --   HGB 11.0* 11.6* 11.1* 11.1*  HCT 35.0* 34.0* 33.7* 33.3*  MCV 90.0  --  89.6 86.5  PLT 107*  --  100* 93*    Basic Metabolic Panel: Recent Labs  Lab 03/08/23 1050 03/08/23 1137 03/09/23 0116 03/10/23 0140  NA 130* 135 134* 135  K 3.7 3.6 3.5 4.1  CL 99 100 104 103  CO2 22  --  20* 20*  GLUCOSE 106* 108* 81 77  BUN 16 16 15 20   CREATININE 1.19 1.20 1.04 1.20  CALCIUM 8.4*  --  8.1* 8.1*    GFR: Estimated Creatinine Clearance: 34.2 mL/min (by C-G formula based on SCr of 1.2 mg/dL).  Liver Function Tests: Recent Labs  Lab 03/08/23 1050 03/09/23 0116  AST 56* 50*  ALT 32 29  ALKPHOS 53 48  BILITOT 0.7 0.6  PROT 6.1* 5.6*  ALBUMIN 3.0* 2.7*   No results for input(s): "LIPASE", "AMYLASE" in the last 168 hours. No results for input(s): "AMMONIA" in the last 168 hours.  Coagulation Profile: Recent Labs  Lab 03/08/23 1050  INR 1.5*    Cardiac Enzymes: No results for input(s): "CKTOTAL", "CKMB", "CKMBINDEX", "TROPONINI" in the last 168 hours.  BNP (last 3 results) Recent Labs    09/28/22 1024 10/05/22 1412  PROBNP 2,173* 2,429*    Lipid Profile: Recent Labs    03/08/23 1049  CHOL 88  HDL 33*  LDLCALC 39  TRIG 80  CHOLHDL 2.7    Thyroid Function Tests: No results for input(s): "TSH", "T4TOTAL",  "FREET4", "T3FREE", "THYROIDAB" in the last 72 hours.  Anemia Panel: No results for input(s): "VITAMINB12", "FOLATE", "FERRITIN", "TIBC", "IRON", "RETICCTPCT" in the last 72 hours.  Urine analysis:    Component Value Date/Time   COLORURINE YELLOW 03/08/2023 1336   APPEARANCEUR CLEAR 03/08/2023 1336   APPEARANCEUR Cloudy (A) 03/12/2021 1147   LABSPEC 1.010 03/08/2023 1336   PHURINE 6.0 03/08/2023 1336   GLUCOSEU NEGATIVE 03/08/2023 1336   HGBUR NEGATIVE 03/08/2023 1336   HGBUR trace-lysed 02/07/2008 1101   BILIRUBINUR NEGATIVE 03/08/2023 1336   BILIRUBINUR Negative 03/12/2021 1147   KETONESUR NEGATIVE 03/08/2023 1336   PROTEINUR 100 (A) 03/08/2023 1336   UROBILINOGEN 0.2  02/07/2008 1101   NITRITE NEGATIVE 03/08/2023 1336   LEUKOCYTESUR NEGATIVE 03/08/2023 1336    Sepsis Labs: Lactic Acid, Venous    Component Value Date/Time   LATICACIDVEN 0.8 04/27/2021 2327    MICROBIOLOGY: No results found for this or any previous visit (from the past 240 hour(s)).  RADIOLOGY STUDIES/RESULTS: MR CERVICAL SPINE WO CONTRAST  Result Date: 03/09/2023 CLINICAL DATA:  Myelopathy, chronic, cervical spine. EXAM: MRI CERVICAL SPINE WITHOUT CONTRAST TECHNIQUE: Multiplanar, multisequence MR imaging of the cervical spine was performed. No intravenous contrast was administered. COMPARISON:  CT done yesterday. FINDINGS: Alignment: No malalignment. Vertebrae: No fracture or focal bone lesion. Degenerative endplate marrow changes, with mild edema at the C3-4 and C5-6 levels, which could relate to regional pain. Cord: No primary cord lesion. See below regarding stenosis from C3-4 through C5-6. Posterior Fossa, vertebral arteries, paraspinal tissues: Negative Disc levels: The foramen magnum is widely patent. There is ordinary mild osteoarthritis of the C1-2 articulation but no encroachment upon the neural structures. C2-3: Endplate osteophytes and bulging of the disc. Mild facet and ligamentous hypertrophy. Mild  canal narrowing but no compressive effect upon the cord. Bilateral foraminal narrowing which could affect the C3 nerves. C3-4: Endplate osteophytes and central disc herniation. Severe spinal stenosis in the midline with AP diameter of the canal only 4 mm. Effacement of the subarachnoid space and deformity of the cord. Bilateral foraminal stenosis could affect the C4 nerves. C4-5: Endplate osteophytes and central disc herniation. AP diameter of the canal in the midline as narrow as 6 mm. Slight indentation of the cord. Bilateral foraminal narrowing that could affect the C5 nerves. C5-6: Endplate osteophytes and bulging of the disc. Narrowing of the ventral subarachnoid space but no compressive effect upon the cord. AP diameter of the canal in the midline 7.5 mm. Bilateral foraminal stenosis that could affect either C6 nerve. C6-7: Mild bulging of the disc. No canal stenosis. Foraminal narrowing on the left that could affect the C7 nerve. C7-T1: Mild bulging of the disc. No central canal stenosis. No compressive foraminal narrowing. IMPRESSION: 1. Severe multifactorial spinal stenosis at C3-4 with AP diameter of the canal only 4 mm. Effacement of the subarachnoid space and deformity of the cord. No abnormal cord signal at this time however. None the less, this finding could result in compressive myelopathy. 2. Spinal stenosis at C4-5 and C5-6 with AP diameter of the canal only 6 mm at C4-5 and 7.5 mm at C5-6. 3. Foraminal stenosis that could cause neural compression bilaterally at C2-3, bilateral at C3-4, bilateral at C4-5, bilateral at C5-6 and on the left at C6-7. Electronically Signed   By: Paulina Fusi M.D.   On: 03/09/2023 10:13   MR BRAIN WO CONTRAST  Result Date: 03/08/2023 CLINICAL DATA:  Right lower extremity weakness EXAM: MRI HEAD WITHOUT CONTRAST TECHNIQUE: Multiplanar, multiecho pulse sequences of the brain and surrounding structures were obtained without intravenous contrast. COMPARISON:  None  Available. FINDINGS: Brain: No acute infarct, mass effect or extra-axial collection. No acute or chronic hemorrhage. There is multifocal hyperintense T2-weighted signal within the white matter. Generalized volume loss. The midline structures are normal. Vascular: Occluded right vertebral artery. Skull and upper cervical spine: Normal calvarium and skull base. Visualized upper cervical spine and soft tissues are normal. Sinuses/Orbits:No paranasal sinus fluid levels or advanced mucosal thickening. No mastoid or middle ear effusion. Normal orbits. IMPRESSION: 1. No acute intracranial abnormality. 2. Occluded right vertebral artery. 3. Findings of chronic small vessel ischemia and volume loss. Electronically  Signed   By: Deatra Robinson M.D.   On: 03/08/2023 20:22   CT ANGIO HEAD NECK W WO CM  Result Date: 03/08/2023 CLINICAL DATA:  Right-sided weakness EXAM: CT ANGIOGRAPHY HEAD AND NECK WITH AND WITHOUT CONTRAST TECHNIQUE: Multidetector CT imaging of the head and neck was performed using the standard protocol during bolus administration of intravenous contrast. Multiplanar CT image reconstructions and MIPs were obtained to evaluate the vascular anatomy. Carotid stenosis measurements (when applicable) are obtained utilizing NASCET criteria, using the distal internal carotid diameter as the denominator. RADIATION DOSE REDUCTION: This exam was performed according to the departmental dose-optimization program which includes automated exposure control, adjustment of the mA and/or kV according to patient size and/or use of iterative reconstruction technique. CONTRAST:  75mL OMNIPAQUE IOHEXOL 350 MG/ML SOLN COMPARISON:  None Available. FINDINGS: CTA NECK FINDINGS SKELETON: There is no bony spinal canal stenosis. No lytic or blastic lesion. OTHER NECK: Normal pharynx, larynx and major salivary glands. No cervical lymphadenopathy. Unremarkable thyroid gland. UPPER CHEST: No pneumothorax or pleural effusion. No nodules or  masses. AORTIC ARCH: There is calcific atherosclerosis of the aortic arch. Conventional 3 vessel aortic branching pattern. RIGHT CAROTID SYSTEM: Qualitatively severe stenosis of the proximal right ICA due to mixed density atherosclerosis. By NASCET criteria this measures approximately 80%. LEFT CAROTID SYSTEM: Atherosclerotic calcification causes moderate stenosis of the distal common carotid artery. No stenosis of the internal carotid artery. VERTEBRAL ARTERIES: Left dominant configuration.The right vertebral artery is occluded along its entire course. The left vertebral artery is normal. CTA HEAD FINDINGS POSTERIOR CIRCULATION: --Vertebral arteries: Occluded right V4 segment. Bulky atherosclerosis with severe stenosis of the left V4 segment. --Inferior cerebellar arteries: Normal left. Right not clearly visualized. --Basilar artery: Mild atherosclerotic calcification without stenosis or occlusion. --Superior cerebellar arteries: Normal. --Posterior cerebral arteries (PCA): Normal. ANTERIOR CIRCULATION: --Intracranial internal carotid arteries: Bilateral atherosclerotic calcification causing moderate stenosis of the distal right cavernous segment. --Anterior cerebral arteries (ACA): Patent --Middle cerebral arteries (MCA): Normal. VENOUS SINUSES: As permitted by contrast timing, patent. ANATOMIC VARIANTS: None Review of the MIP images confirms the above findings. IMPRESSION: 1. No intracranial large vessel occlusion. 2. Qualitatively severe stenosis of the proximal right ICA due to mixed density atherosclerosis. 3. Occlusion of the right vertebral artery along its entire course, age indeterminate. 4. Severe stenosis of the left vertebral artery V4 segment. 5. Moderate stenosis of the distal right ICA cavernous segment. Aortic Atherosclerosis (ICD10-I70.0). Electronically Signed   By: Deatra Robinson M.D.   On: 03/08/2023 20:17   ECHOCARDIOGRAM COMPLETE  Result Date: 03/08/2023    ECHOCARDIOGRAM REPORT   Patient  Name:   Roberts Larralde. Date of Exam: 03/08/2023 Medical Rec #:  [id number]           Height:       65.0 in Accession #:    [id number]          Weight:       160.0 lb Date of Birth:  Jun 09, 1928           BSA:          1.799 m Patient Age:    89 years            BP:           147/89 mmHg Patient Gender: M                   HR:           53 bpm. Exam  Location:  Inpatient Procedure: 2D Echo, Cardiac Doppler and Color Doppler Indications:    Stroke I63.9  History:        Patient has prior history of Echocardiogram examinations, most                 recent 03/04/2021. CAD, PAD, Stroke and CKD, Arrythmias:Atrial                 Fibrillation; Risk Factors:Dyslipidemia, Diabetes, Non-Smoker,                 Hypertension and Sleep Apnea.  Sonographer:    Dondra Prader RVT RCS Referring Phys: [phone] RONDELL A SMITH IMPRESSIONS  1. Left ventricular ejection fraction, by estimation, is 55 to 60%. The left ventricle has normal function. The left ventricle has no regional wall motion abnormalities. There is moderate concentric left ventricular hypertrophy. Left ventricular diastolic parameters are consistent with Grade III diastolic dysfunction (restrictive).  2. Right ventricular systolic function is normal. The right ventricular size is moderately enlarged. There is mildly elevated pulmonary artery systolic pressure. The estimated right ventricular systolic pressure is 42.0 mmHg.  3. Left atrial size was severely dilated.  4. Right atrial size was severely dilated.  5. The mitral valve is grossly normal. Trivial mitral valve regurgitation. Moderate mitral annular calcification.  6. Tricuspid valve regurgitation is moderate.  7. The aortic valve is calcified. There is severe calcifcation of the aortic valve. There is moderate thickening of the aortic valve. Aortic valve regurgitation is mild. Aortic valve mean gradient measures 9.0 mmHg. Aortic valve Vmax measures 2.06 m/s.  8. The inferior vena cava is normal in size with  greater than 50% respiratory variability, suggesting right atrial pressure of 3 mmHg. FINDINGS  Left Ventricle: Left ventricular ejection fraction, by estimation, is 55 to 60%. The left ventricle has normal function. The left ventricle has no regional wall motion abnormalities. The left ventricular internal cavity size was normal in size. There is  moderate concentric left ventricular hypertrophy. Left ventricular diastolic function could not be evaluated due to atrial fibrillation. Left ventricular diastolic parameters are consistent with Grade III diastolic dysfunction (restrictive). Right Ventricle: The right ventricular size is moderately enlarged. No increase in right ventricular wall thickness. Right ventricular systolic function is normal. There is mildly elevated pulmonary artery systolic pressure. The tricuspid regurgitant velocity is 3.04 m/s, and with an assumed right atrial pressure of 5 mmHg, the estimated right ventricular systolic pressure is 42.0 mmHg. Left Atrium: Left atrial size was severely dilated. Right Atrium: Right atrial size was severely dilated. Pericardium: There is no evidence of pericardial effusion. Mitral Valve: The mitral valve is grossly normal. There is mild calcification of the mitral valve leaflet(s). Moderate mitral annular calcification. Trivial mitral valve regurgitation. Tricuspid Valve: The tricuspid valve is normal in structure. Tricuspid valve regurgitation is moderate. Aortic Valve: The aortic valve is calcified. There is severe calcifcation of the aortic valve. There is moderate thickening of the aortic valve. There is moderate aortic valve annular calcification. Aortic valve regurgitation is mild. Aortic valve mean gradient measures 9.0 mmHg. Aortic valve peak gradient measures 16.9 mmHg. Aortic valve area, by VTI measures 2.27 cm. Pulmonic Valve: The pulmonic valve was normal in structure. Pulmonic valve regurgitation is trivial. Aorta: The aortic root and ascending  aorta are structurally normal, with no evidence of dilitation. Venous: The inferior vena cava is normal in size with greater than 50% respiratory variability, suggesting right atrial pressure of 3 mmHg. IAS/Shunts: No atrial level  shunt detected by color flow Doppler.  LEFT VENTRICLE PLAX 2D LVIDd:         4.70 cm   Diastology LVIDs:         3.20 cm   LV e' medial:    7.40 cm/s LV PW:         1.20 cm   LV E/e' medial:  17.7 LV IVS:        1.30 cm   LV e' lateral:   13.95 cm/s LVOT diam:     1.90 cm   LV E/e' lateral: 9.4 LV SV:         96 LV SV Index:   53 LVOT Area:     2.84 cm  RIGHT VENTRICLE             IVC RV Basal diam:  4.20 cm     IVC diam: 1.60 cm RV S prime:     13.50 cm/s TAPSE (M-mode): 1.9 cm LEFT ATRIUM              Index        RIGHT ATRIUM           Index LA diam:        5.40 cm  3.00 cm/m   RA Area:     38.20 cm LA Vol (A2C):   155.0 ml 86.15 ml/m  RA Volume:   149.00 ml 82.82 ml/m LA Vol (A4C):   130.0 ml 72.26 ml/m LA Biplane Vol: 157.0 ml 87.26 ml/m  AORTIC VALVE                     PULMONIC VALVE AV Area (Vmax):    2.19 cm      PV Vmax:       1.13 m/s AV Area (Vmean):   2.15 cm      PV Peak grad:  5.1 mmHg AV Area (VTI):     2.27 cm AV Vmax:           205.67 cm/s AV Vmean:          140.000 cm/s AV VTI:            0.422 m AV Peak Grad:      16.9 mmHg AV Mean Grad:      9.0 mmHg LVOT Vmax:         159.00 cm/s LVOT Vmean:        106.333 cm/s LVOT VTI:          0.337 m LVOT/AV VTI ratio: 0.80 AR Vena Contracta: 0.80 cm  AORTA Ao Root diam: 3.60 cm Ao Asc diam:  3.60 cm MITRAL VALVE                TRICUSPID VALVE MV Area (PHT): 4.43 cm     TR Peak grad:   37.0 mmHg MV Decel Time: 171 msec     TR Vmax:        304.00 cm/s MV E velocity: 130.67 cm/s MV A velocity: 46.00 cm/s   SHUNTS MV E/A ratio:  2.84         Systemic VTI:  0.34 m                             Systemic Diam: 1.90 cm Aditya Sabharwal Electronically signed by Dorthula Nettles Signature Date/Time: 03/08/2023/5:39:27 PM    Final       LOS: 1 day  Jeoffrey Massed, MD  [facility]    To contact the attending provider between 7A-7P or the covering provider during after hours 7P-7A, please log into the web site [email or web address] and access using universal [place] password for that web site. If you do not have the password, please call the hospital operator.  03/10/2023, 2:14 PM

## 2023-03-11 DIAGNOSIS — I639 Cerebral infarction, unspecified: Secondary | ICD-10-CM | POA: Diagnosis not present

## 2023-03-11 DIAGNOSIS — E1152 Type 2 diabetes mellitus with diabetic peripheral angiopathy with gangrene: Secondary | ICD-10-CM | POA: Diagnosis not present

## 2023-03-11 DIAGNOSIS — N1832 Chronic kidney disease, stage 3b: Secondary | ICD-10-CM | POA: Diagnosis not present

## 2023-03-11 NOTE — Plan of Care (Signed)
  Problem: Self-Care: Goal: Ability to participate in self-care as condition permits will improve Outcome: Progressing   Problem: Education: Goal: Knowledge of General Education information will improve Description: Including pain rating scale, medication(s)/side effects and non-pharmacologic comfort measures Outcome: Progressing   Problem: Activity: Goal: Risk for activity intolerance will decrease Outcome: Progressing

## 2023-03-11 NOTE — TOC Initial Note (Signed)
Transition of Care [facility]) - Initial/Assessment Note    Patient Details  Name: Eric [name]. MRN: [id number] Date of Birth: [birth year]  Transition of Care [facility]) CM/SW Contact:    Mearl Latin, LCSW Phone Number: 03/11/2023, 12:36 PM  Clinical Narrative:                 TOC following for insurance results for CIR.   Expected Discharge Plan: IP Rehab Facility Barriers to Discharge: Continued Medical Work up, English as a second language teacher   Patient Goals and CMS Choice Patient states their goals for this hospitalization and ongoing recovery are:: Rehab CMS Medicare.gov Compare Post Acute Care list provided to:: Patient Choice offered to / list presented to : Patient [place] ownership interest in [facility].provided to:: Patient    Expected Discharge Plan and Services In-house Referral: Clinical Social Work   Post Acute Care Choice: IP Rehab Living arrangements for the past 2 months: Single Family Home                                      Prior Living Arrangements/Services Living arrangements for the past 2 months: Single Family Home   Patient language and need for interpreter reviewed:: Yes        Need for Family Participation in Patient Care: Yes (Comment) Care giver support system in place?: Yes (comment) Current home services: DME (Cane, walker, shower bench.) Criminal Activity/Legal Involvement Pertinent to Current Situation/Hospitalization: No - Comment as needed  Activities of Daily Living Home Assistive Devices/Equipment: Prosthesis, Walker (specify type) ADL Screening (condition at time of admission) Patient's cognitive ability adequate to safely complete daily activities?: Yes Is the patient deaf or have difficulty hearing?: Yes Does the patient have difficulty seeing, even when wearing glasses/contacts?: No Does the patient have difficulty concentrating, remembering, or making decisions?: No Patient able to express need for assistance with  ADLs?: Yes Does the patient have difficulty dressing or bathing?: Yes Independently performs ADLs?: No Communication: Independent Dressing (OT): Needs assistance Is this a change from baseline?: Pre-admission baseline Grooming: Independent Feeding: Independent Bathing: Independent with device (comment) Toileting: Independent with device (comment) In/Out Bed: Independent with device (comment) Walks in Home: Independent with device (comment) Does the patient have difficulty walking or climbing stairs?: Yes Weakness of Legs: Right (left BKA) Weakness of Arms/Hands: Right  Permission Sought/Granted                  Emotional Assessment Appearance:: Appears stated age Attitude/Demeanor/Rapport: Engaged Affect (typically observed): Accepting Orientation: : Oriented to Self, Oriented to Place, Oriented to  Time, Oriented to Situation Alcohol / Substance Use: Not Applicable Psych Involvement: No (comment)  Admission diagnosis:  CVA (cerebral vascular accident) (HCC) [I63.9] Cerebrovascular accident (CVA), unspecified mechanism (HCC) [I63.9] Patient Active Problem List   Diagnosis Date Noted   CVA (cerebral vascular accident) (HCC) 03/08/2023   Thrombocytopenia (HCC) 03/08/2023   Abnormal gait 02/12/2023   Coping style affecting medical condition 12/11/2022   Traumatic closed trochanteric fracture of femur with minimal displacement with routine healing, left 12/05/2022   Greater trochanter fracture (HCC) 12/04/2022   Closed left femoral fracture (HCC) 11/30/2022   Pain due to onychomycosis of toenails of both feet 04/23/2022   Hammer toes, bilateral 04/23/2022   Porokeratosis 04/23/2022   Diverticulosis of colon with hemorrhage    Erosive gastritis    Benign neoplasm of transverse colon    Benign  neoplasm of descending colon    Acute GI bleeding 04/27/2021   Acute blood loss anemia 04/27/2021   Acute kidney injury superimposed on CKD (HCC) 04/27/2021   Hematuria 07/24/2020    Hypertensive urgency 03/08/2019   Hx of BKA, left (HCC) 01/13/2018   Nausea and vomiting in adult 12/26/2017   Pressure injury of skin 12/23/2017   C. difficile colitis 12/22/2017   UTI (urinary tract infection) 12/22/2017   Hyponatremia 12/22/2017   Normocytic anemia 12/22/2017   ASO (arteriosclerosis obliterans) 11/23/2017   Chronic anticoagulation 09/26/2015   History of anticoagulant therapy 09/26/2015   PVD (peripheral vascular disease) (HCC) 06/02/2012   Type 2 diabetes mellitus (HCC) 05/17/2012   Benign prostatic hyperplasia 11/16/2011   AKI (acute kidney injury) (HCC) 08/26/2011   Chronic kidney disease (CKD), stage III (moderate) (HCC) 08/26/2011   Chronic coronary artery disease 03/25/2011   Obstructive sleep apnea 09/15/2010   Other specified extrapyramidal and movement disorders 09/15/2010   PERSONAL HISTORY OF COLONIC POLYPS 10/25/2009   Vitamin B-complex deficiency 03/04/2009   ATRIAL FIBRILLATION 12/25/2008   Atrial flutter (HCC) 12/25/2008   Hyperlipidemia 11/24/2006   Essential hypertension 11/24/2006   PCP:  [name], MD Pharmacy:   [facility] [address], [place] - [address] [place] [address] Phone: [phone] Fax: [phone]  [facility] - [place], [place] - [address] [place] [address] Phone: [phone] Fax: [phone]     Social Determinants of Health (SDOH) Social History: SDOH Screenings   Food Insecurity: No Food Insecurity (03/08/2023)  Housing: Low Risk  (03/08/2023)  Transportation Needs: No Transportation Needs (03/08/2023)  Utilities: Not At Risk (03/08/2023)  Alcohol Screen: Low Risk  (08/12/2022)  Depression (PHQ2-9): Low Risk  (02/12/2023)  Financial Resource Strain: Low Risk  (08/12/2022)  Physical Activity: Insufficiently Active (08/12/2022)  Social Connections: Socially Integrated (08/12/2022)  Stress: No Stress Concern Present  (08/12/2022)  Tobacco Use: Medium Risk (03/08/2023)   SDOH Interventions:     Readmission Risk Interventions     No data to display

## 2023-03-11 NOTE — Progress Notes (Signed)
PROGRESS NOTE        PATIENT DETAILS Name: Eric [name]. Age: 89 y.o. Sex: male Date of Birth: 02-09-1928 Admit Date: 03/08/2023 Admitting Physician Starleen Arms, MD ZOX:[id number], Priscille Heidelberg, MD  Brief Summary: Patient is a 89 y.o.  male with a history of atrial fibrillation on Eliquis, CAD s/p CABG, PAD-s/p left BKA, CKD stage IIIb, DM-2-who presented with right-sided weakness-upon further evaluation-patient was found to have severe compressive cervical spinal stenosis.  Significant events: 7/29>> admit to [facility]  Significant studies: 7/29>> CT head: No acute intracranial pathology 7/29>> MRI brain: No acute CVA. 7/29>> CT angio head/neck: No LVO, severe stenosis of proximal right ICA due to mixed density atherosclerosis, occlusion of right vertebral artery, severe stenosis of left vertebral artery V4 segment. 7/29>> echo: EF 55-60%, grade 3 diastolic dysfunction. 7/29>> LDL: 39 7/29>> A1c: 5.6 7/30>> MRI C-spine: Severe multifactorial spinal stenosis.  Significant microbiology data: None  Procedures: None  Consults: Neurology CIR  Subjective: No issues overnight-awaiting CIR bed.  Continued improvement in right upper extremity chest pain/right lower extremity strength.  Objective: Vitals: Blood pressure 137/66, pulse 70, temperature 98.3 F (36.8 C), temperature source Oral, resp. rate (!) 21, height 5\' 5"  (1.651 m), weight 72.6 kg, SpO2 95%.   Exam: Gen Exam:Alert awake-not in any distress HEENT:atraumatic, normocephalic Chest: B/L clear to auscultation anteriorly CVS:S1S2 regular Abdomen:soft non tender, non distended Extremities:no edema Neurology: Right upper/right lower extremity weakness.  But overall better. Skin: no rash  Pertinent Labs/Radiology:    Latest Ref Rng & Units 03/10/2023    1:40 AM 03/09/2023    1:16 AM 03/08/2023   11:37 AM  CBC  WBC 4.0 - 10.5 K/uL 6.0  4.7    Hemoglobin 13.0 - 17.0 g/dL 09.8  11.9   14.7   Hematocrit 39.0 - 52.0 % 33.3  33.7  34.0   Platelets 150 - 400 K/uL 93  100      Lab Results  Component Value Date   NA 135 03/10/2023   K 4.1 03/10/2023   CL 103 03/10/2023   CO2 20 (L) 03/10/2023      Assessment/Plan: Severe compressive cervical spinal stenosis causing right-sided hemiparesis Evaluated by neurology and then by neurosurgery-not a candidate for decompressive surgery Per patient report-significant improvement in his right upper extremity/right lower extremity strength today Continue PT/OT-CIR evaluation ongoing-awaiting insurance authorization.  Chronic atrial fibrillation Rate controlled Eliquis  HTN BP stable Continue amlodipine, atenolol, hydralazine  CAD s/p CABG in 2000 with subsequent PCI in 2020 PAD s/p left BKA Stable Not on antiplatelets due to patient being on Eliquis Remains on statin/Trental  DM-2 Diet controlled CBG stable  No results for input(s): "GLUCAP" in the last 72 hours.    CKD stage IIIb At baseline  BPH Flomax/finasteride  BMI: Estimated body mass index is 26.63 kg/m as calculated from the following:   Height as of this encounter: 5\' 5"  (1.651 m).   Weight as of this encounter: 72.6 kg.   Code status:   Code Status: Full Code   DVT Prophylaxis: apixaban (ELIQUIS) tablet 2.5 mg Start: 03/09/23 1000 Place and maintain sequential compression device Start: 03/08/23 1917 apixaban (ELIQUIS) tablet 2.5 mg    Family Communication: None at bedside   Disposition Plan: Status is: Inpatient Remains inpatient appropriate because: Severity of illness   Planned Discharge Destination:Rehabilitation facility  Diet: Diet Order             Diet Heart Room service appropriate? Yes; Fluid consistency: Thin  Diet effective now                     Antimicrobial agents: Anti-infectives (From admission, onward)    None        MEDICATIONS: Scheduled Meds:  amLODipine  10 mg Oral Daily   apixaban  2.5  mg Oral BID   atenolol  12.5 mg Oral Daily   finasteride  5 mg Oral Daily   fluticasone  1 spray Each Nare Daily   hydrALAZINE  75 mg Oral BID   pentoxifylline  400 mg Oral Q12H   rosuvastatin  40 mg Oral Daily   tamsulosin  0.8 mg Oral QPC supper   Continuous Infusions: PRN Meds:.acetaminophen **OR** acetaminophen (TYLENOL) oral liquid 160 mg/5 mL **OR** acetaminophen, senna-docusate   I have personally reviewed following labs and imaging studies  LABORATORY DATA: CBC: Recent Labs  Lab 03/08/23 1051 03/08/23 1137 03/09/23 0116 03/10/23 0140  WBC 6.4  --  4.7 6.0  NEUTROABS 2.7  --   --   --   HGB 11.0* 11.6* 11.1* 11.1*  HCT 35.0* 34.0* 33.7* 33.3*  MCV 90.0  --  89.6 86.5  PLT 107*  --  100* 93*    Basic Metabolic Panel: Recent Labs  Lab 03/08/23 1050 03/08/23 1137 03/09/23 0116 03/10/23 0140  NA 130* 135 134* 135  K 3.7 3.6 3.5 4.1  CL 99 100 104 103  CO2 22  --  20* 20*  GLUCOSE 106* 108* 81 77  BUN 16 16 15 20   CREATININE 1.19 1.20 1.04 1.20  CALCIUM 8.4*  --  8.1* 8.1*    GFR: Estimated Creatinine Clearance: 34.2 mL/min (by C-G formula based on SCr of 1.2 mg/dL).  Liver Function Tests: Recent Labs  Lab 03/08/23 1050 03/09/23 0116  AST 56* 50*  ALT 32 29  ALKPHOS 53 48  BILITOT 0.7 0.6  PROT 6.1* 5.6*  ALBUMIN 3.0* 2.7*   No results for input(s): "LIPASE", "AMYLASE" in the last 168 hours. No results for input(s): "AMMONIA" in the last 168 hours.  Coagulation Profile: Recent Labs  Lab 03/08/23 1050  INR 1.5*    Cardiac Enzymes: No results for input(s): "CKTOTAL", "CKMB", "CKMBINDEX", "TROPONINI" in the last 168 hours.  BNP (last 3 results) Recent Labs    09/28/22 1024 10/05/22 1412  PROBNP 2,173* 2,429*    Lipid Profile: No results for input(s): "CHOL", "HDL", "LDLCALC", "TRIG", "CHOLHDL", "LDLDIRECT" in the last 72 hours.   Thyroid Function Tests: No results for input(s): "TSH", "T4TOTAL", "FREET4", "T3FREE", "THYROIDAB" in  the last 72 hours.  Anemia Panel: No results for input(s): "VITAMINB12", "FOLATE", "FERRITIN", "TIBC", "IRON", "RETICCTPCT" in the last 72 hours.  Urine analysis:    Component Value Date/Time   COLORURINE YELLOW 03/08/2023 1336   APPEARANCEUR CLEAR 03/08/2023 1336   APPEARANCEUR Cloudy (A) 03/12/2021 1147   LABSPEC 1.010 03/08/2023 1336   PHURINE 6.0 03/08/2023 1336   GLUCOSEU NEGATIVE 03/08/2023 1336   HGBUR NEGATIVE 03/08/2023 1336   HGBUR trace-lysed 02/07/2008 1101   BILIRUBINUR NEGATIVE 03/08/2023 1336   BILIRUBINUR Negative 03/12/2021 1147   KETONESUR NEGATIVE 03/08/2023 1336   PROTEINUR 100 (A) 03/08/2023 1336   UROBILINOGEN 0.2 02/07/2008 1101   NITRITE NEGATIVE 03/08/2023 1336   LEUKOCYTESUR NEGATIVE 03/08/2023 1336    Sepsis Labs: Lactic Acid, Venous    Component  Value Date/Time   LATICACIDVEN 0.8 04/27/2021 2327    MICROBIOLOGY: No results found for this or any previous visit (from the past 240 hour(s)).  RADIOLOGY STUDIES/RESULTS: No results found.   LOS: 2 days   Jeoffrey Massed, MD  [facility]    To contact the attending provider between 7A-7P or the covering provider during after hours 7P-7A, please log into the web site [email or web address] and access using universal [place] password for that web site. If you do not have the password, please call the hospital operator.  03/11/2023, 10:52 AM

## 2023-03-11 NOTE — Progress Notes (Signed)
Occupational Therapy Treatment Patient Details Name: Eric [name]. MRN: [id number] DOB: [birth year] Today's Date: 03/11/2023   History of present illness 89 y.o. male presents 03/08/23 with right-sided weakness starting 3 days ago. MRI brain negative for acute stroke; MRI C spine showing multi level spinal stenosis though no abnormal cord signal change. PMH significant of atrial fibrillation/flutter Eliquis, CAD s/p CABG, diet-controlled diabetes mellitus type 2, PVD s/p right BKA , and CKD stage III   OT comments  Pt progressing toward established OT goals. Focus session on RUE strength, coordination, and neuromuscular re-ed to optimize normalized movement patterns and functional use. Pt performing self-feeding with built up grip on spoon with increased time for new learning and cueing for proper body mechanics. Engagement in additional exercises with RUE with focus on proper body mechanics and allowed rest breaks as necessary. Pt reporting still significantly different than baseline and motivated to be able to cook for his wife again. Due to significant change in status recommending intensive multidisciplinary rehabilitation >3 hours/day to optimize safety and independence in ADL.    Recommendations for follow up therapy are one component of a multi-disciplinary discharge planning process, led by the attending physician.  Recommendations may be updated based on patient status, additional functional criteria and insurance authorization.    Assistance Recommended at Discharge Intermittent Supervision/Assistance  Patient can return home with the following  A little help with bathing/dressing/bathroom;A little help with walking and/or transfers;Assistance with cooking/housework;Assist for transportation   Equipment Recommendations  None recommended by OT    Recommendations for Other Services Rehab consult    Precautions / Restrictions Precautions Precautions: Fall Precaution Comments: 2  recent falls; states broke L hip 6-8 wks ago (non-surgical); 2 days PTA Rt knee gave out Restrictions Weight Bearing Restrictions: No       Mobility Bed Mobility               General bed mobility comments: in recliner on arrival and departure    Transfers Overall transfer level: Needs assistance Equipment used: Rolling walker (2 wheels) Transfers: Sit to/from Stand Sit to Stand: Min guard           General transfer comment: pt heavily relying on locked chair frame to assist wtih BLE stability when standing from recliner with min guard assist with armrests     Balance Overall balance assessment: Needs assistance, History of Falls Sitting-balance support: No upper extremity supported, Feet supported Sitting balance-Leahy Scale: Good Sitting balance - Comments: edge of recliner                                   ADL either performed or assessed with clinical judgement   ADL Overall ADL's : Needs assistance/impaired Eating/Feeding: Sitting;Minimal assistance;Set up Eating/Feeding Details (indicate cue type and reason): providing built up grip for self feeding and pt initially requiring max education for functional use as pt with awkward grasp, but with education and initally min A, able to progress to set-up. Feeding self 10 grapes with ergonomic positioning of table and supporting plastic bowl with L hand while sefl feeding with R hand and built up grasp. Needing 3 rest breaks between repetitions Grooming: Set up;Sitting;Oral care Grooming Details (indicate cue type and reason): taking dentures out with set-up of cup of water to put them in. Able to tear one denture tablet package off but unable to tear again to open, requring mod A  General ADL Comments: focus session on RUE. Self feeding and exercises, see exercises below.    Extremity/Trunk Assessment Upper Extremity Assessment Upper Extremity Assessment: RUE  deficits/detail RUE Deficits / Details: deficits in dexterity due to poor sensation. Generally weak. Able to oppose thumb to all digits. Difficulty picking up small items; heavily reliant on vision. Able to pick up toothpaste cap, but max compensation for flat heart monitor sticker. RUE Sensation: decreased light touch RUE Coordination: decreased fine motor   Lower Extremity Assessment Lower Extremity Assessment: Defer to PT evaluation        Vision   Vision Assessment?: No apparent visual deficits   Perception     Praxis      Cognition Arousal/Alertness: Awake/alert Behavior During Therapy: WFL for tasks assessed/performed Overall Cognitive Status: Within Functional Limits for tasks assessed                                          Exercises Exercises: Other exercises Other Exercises Other Exercises: Challenging FM/GM control and strength/endurance with pt using built up grip and spoon to self feed grapes (see above), then using spoon with built up grip and spoon to transfer grapes one by one from one container to next. Spoon in R hand and stabilizing cup with L hand while retrieving grapes. Other Exercises: finger to thumb opposition 10x Other Exercises: picking up heart monitor stickers as well as toothpaste cap with set-up A. Pt heavily reliant on vision for toothpaste cap reporting he cannot feel it in his hand, however, max compensatory techniques when attempting heart monitor stickers lying flat. Pulling to edge of table to pick up. Cued to perform without and requiring >30 seconds to pick up 2 attempts (reporting this happens when attempting to pick up form at lunch time as well) Other Exercises: Shoulder FF 20x; squeeze ball 30x    Shoulder Instructions       General Comments      Pertinent Vitals/ Pain       Pain Assessment Pain Assessment: Faces Faces Pain Scale: Hurts little more Pain Location: with RUE exercise Pain Descriptors / Indicators:  Aching, Burning Pain Intervention(s): Limited activity within patient's tolerance, Monitored during session  Home Living                                          Prior Functioning/Environment              Frequency  Min 2X/week        Progress Toward Goals  OT Goals(current goals can now be found in the care plan section)  Progress towards OT goals: Progressing toward goals  Acute Rehab OT Goals Patient Stated Goal: regain independence/be able to cook for wife OT Goal Formulation: With patient Time For Goal Achievement: 03/23/23 Potential to Achieve Goals: Good ADL Goals Pt Will Perform Grooming: with modified independence;standing Pt Will Perform Lower Body Bathing: with modified independence;sit to/from stand;sitting/lateral leans Pt Will Perform Lower Body Dressing: with modified independence;sit to/from stand;sitting/lateral leans Pt Will Transfer to Toilet: with modified independence;ambulating Pt/caregiver will Perform Home Exercise Program: Increased strength;Right Upper extremity;Independently;With written HEP provided  Plan Discharge plan remains appropriate;Frequency remains appropriate    Co-evaluation  AM-PAC OT "6 Clicks" Daily Activity     Outcome Measure   Help from another person eating meals?: A Little Help from another person taking care of personal grooming?: A Little Help from another person toileting, which includes using toliet, bedpan, or urinal?: A Lot Help from another person bathing (including washing, rinsing, drying)?: A Lot Help from another person to put on and taking off regular upper body clothing?: A Little Help from another person to put on and taking off regular lower body clothing?: A Lot 6 Click Score: 15    End of Session Equipment Utilized During Treatment: Gait belt;Rolling walker (2 wheels)  OT Visit Diagnosis: Other abnormalities of gait and mobility (R26.89);Unsteadiness on feet  (R26.81);Muscle weakness (generalized) (M62.81);History of falling (Z91.81)   Activity Tolerance Patient tolerated treatment well   Patient Left in chair;with call bell/phone within reach;with chair alarm set   Nurse Communication Mobility status        Time: 1510-1549 OT Time Calculation (min): 39 min  Charges: OT General Charges $OT Visit: 1 Visit OT Treatments $Self Care/Home Management : 8-22 mins $Neuromuscular Re-education: 8-22 mins $Therapeutic Exercise: 8-22 mins  Myrla Halsted, OTD, OTR/L [facility] Acute Rehabilitation Office: [phone]   Myrla Halsted 03/11/2023, 5:13 PM

## 2023-03-11 NOTE — Progress Notes (Signed)
Physical Therapy Treatment Patient Details Name: Calven Sanker. MRN: [id number] DOB: 02-08-1928 Today's Date: 03/11/2023   History of Present Illness 89 y.o. male presents 03/08/23 with right-sided weakness starting 3 days ago. MRI brain negative for acute stroke; MRI C spine showing multi level spinal stenosis though no abnormal cord signal change. PMH significant of atrial fibrillation/flutter Eliquis, CAD s/p CABG, diet-controlled diabetes mellitus type 2, PVD s/p right BKA , and CKD stage III    PT Comments  Patient continues to improve with RUE and RLE strength. Pt had prosthesis donned on arrival and reports he did need assist to don it due to RUE weakness. Able to tolerate min resistance with knee extension exercise. Highly motivated and making excellent progress.     If plan is discharge home, recommend the following: Assistance with cooking/housework;Assist for transportation;A little help with walking and/or transfers   Can travel by private vehicle        Equipment Recommendations  None recommended by PT    Recommendations for Other Services       Precautions / Restrictions Precautions Precautions: Fall Precaution Comments: 2 recent falls; states broke L hip 6-8 wks ago (non-surgical); 2 days PTA Rt knee gave out Restrictions Weight Bearing Restrictions: No     Mobility  Bed Mobility               General bed mobility comments: in recliner on entry    Transfers Overall transfer level: Needs assistance Equipment used: Rolling walker (2 wheels) Transfers: Sit to/from Stand Sit to Stand: Min guard           General transfer comment: pt heavily relying on locked chair frame to assist wtih BLE stability when standing from recliner with min guard assist with armrests    Ambulation/Gait Ambulation/Gait assistance: Min guard Gait Distance (Feet): 500 Feet Assistive device: Rolling walker (2 wheels) Gait Pattern/deviations: Step-through pattern,  Decreased stride length   Gait velocity interpretation: 1.31 - 2.62 ft/sec, indicative of limited community ambulator   General Gait Details: Fair RW management, pt intermittently needing assist with RW in narrower spaces in the room. No buckling or LOB   Stairs             Wheelchair Mobility     Tilt Bed    Modified Rankin (Stroke Patients Only)       Balance Overall balance assessment: Needs assistance, History of Falls Sitting-balance support: No upper extremity supported, Feet supported Sitting balance-Leahy Scale: Good Sitting balance - Comments: able to assist with donning lt BKA prosthesis using LUE (required assist due to RUE weakness)   Standing balance support: Bilateral upper extremity supported, Reliant on assistive device for balance Standing balance-Leahy Scale: Poor Standing balance comment: reliant on RW                            Cognition Arousal/Alertness: Awake/alert Behavior During Therapy: WFL for tasks assessed/performed Overall Cognitive Status: Within Functional Limits for tasks assessed                                          Exercises General Exercises - Lower Extremity Long Arc Quad: AROM, Both, 20 reps, Seated (initial 10 no resistanc; second 10 min resistance) Hip ABduction/ADduction: AROM, Both, 10 reps, Seated Hip Flexion/Marching: AROM, 10 reps, Seated    General Comments  Pertinent Vitals/Pain Pain Assessment Pain Assessment: No/denies pain    Home Living                          Prior Function            PT Goals (current goals can now be found in the care plan section) Acute Rehab PT Goals Patient Stated Goal: return home after rehab Time For Goal Achievement: 03/23/23 Potential to Achieve Goals: Good Progress towards PT goals: Progressing toward goals    Frequency    Min 1X/week      PT Plan Current plan remains appropriate    Co-evaluation               AM-PAC PT "6 Clicks" Mobility   Outcome Measure  Help needed turning from your back to your side while in a flat bed without using bedrails?: A Little Help needed moving from lying on your back to sitting on the side of a flat bed without using bedrails?: A Little Help needed moving to and from a bed to a chair (including a wheelchair)?: A Little Help needed standing up from a chair using your arms (e.g., wheelchair or bedside chair)?: A Little Help needed to walk in hospital room?: A Little Help needed climbing 3-5 steps with a railing? : A Lot 6 Click Score: 17    End of Session Equipment Utilized During Treatment: Gait belt Activity Tolerance: Patient tolerated treatment well Patient left: in chair;with call bell/phone within reach;with chair alarm set Nurse Communication: Mobility status PT Visit Diagnosis: Repeated falls (R29.6);Muscle weakness (generalized) (M62.81);Difficulty in walking, not elsewhere classified (R26.2)     Time: 4098-1191 PT Time Calculation (min) (ACUTE ONLY): 26 min  Charges:    $Gait Training: 8-22 mins $Therapeutic Exercise: 8-22 mins PT General Charges $$ ACUTE PT VISIT: 1 Visit                      Jerolyn Center, PT Acute Rehabilitation Services  Office [phone]    Zena Amos 03/11/2023, 12:18 PM

## 2023-03-11 NOTE — Progress Notes (Signed)
Inpatient Rehab Admissions Coordinator:   Continue to await a determination from HTA regarding CIR prior auth request.   Estill Dooms, PT, DPT Admissions Coordinator [phone] 03/11/23  12:11 PM

## 2023-03-12 DIAGNOSIS — E1152 Type 2 diabetes mellitus with diabetic peripheral angiopathy with gangrene: Secondary | ICD-10-CM | POA: Diagnosis not present

## 2023-03-12 DIAGNOSIS — N1832 Chronic kidney disease, stage 3b: Secondary | ICD-10-CM | POA: Diagnosis not present

## 2023-03-12 DIAGNOSIS — I639 Cerebral infarction, unspecified: Secondary | ICD-10-CM | POA: Diagnosis not present

## 2023-03-12 NOTE — Progress Notes (Signed)
PROGRESS NOTE        PATIENT DETAILS Name: Eric [name]. Age: 89 y.o. Sex: male Date of Birth: [birth year] Admit Date: 03/08/2023 Admitting Physician Starleen Arms, MD RUE:[id number], Priscille Heidelberg, MD  Brief Summary: Patient is a 89 y.o.  male with a history of atrial fibrillation on Eliquis, CAD s/p CABG, PAD-s/p left BKA, CKD stage IIIb, DM-2-who presented with right-sided weakness-upon further evaluation-patient was found to have severe compressive cervical spinal stenosis.  Significant events: 7/29>> admit to [facility]  Significant studies: 7/29>> CT head: No acute intracranial pathology 7/29>> MRI brain: No acute CVA. 7/29>> CT angio head/neck: No LVO, severe stenosis of proximal right ICA due to mixed density atherosclerosis, occlusion of right vertebral artery, severe stenosis of left vertebral artery V4 segment. 7/29>> echo: EF 55-60%, grade 3 diastolic dysfunction. 7/29>> LDL: 39 7/29>> A1c: 5.6 7/30>> MRI C-spine: Severe multifactorial spinal stenosis.  Significant microbiology data: None  Procedures: None  Consults: Neurology CIR  Subjective: No acute issues-awaiting insurance authorization for CIR.  Objective: Vitals: Blood pressure (!) 158/60, pulse 61, temperature 97.7 F (36.5 C), temperature source Oral, resp. rate 13, height 5\' 5"  (1.651 m), weight 72.6 kg, SpO2 96%.   Exam: Gen Exam:Alert awake-not in any distress HEENT:atraumatic, normocephalic Chest: B/L clear to auscultation anteriorly CVS:S1S2 regular Abdomen:soft non tender, non distended Extremities:no edema Neurology: Right upper/right lower extremity weakness.  But overall better. Skin: no rash  Pertinent Labs/Radiology:    Latest Ref Rng & Units 03/10/2023    1:40 AM 03/09/2023    1:16 AM 03/08/2023   11:37 AM  CBC  WBC 4.0 - 10.5 K/uL 6.0  4.7    Hemoglobin 13.0 - 17.0 g/dL 19.1  47.8  29.5   Hematocrit 39.0 - 52.0 % 33.3  33.7  34.0   Platelets 150 - 400  K/uL 93  100      Lab Results  Component Value Date   NA 135 03/10/2023   K 4.1 03/10/2023   CL 103 03/10/2023   CO2 20 (L) 03/10/2023      Assessment/Plan: Severe compressive cervical spinal stenosis causing right-sided hemiparesis Evaluated by neurology and then by neurosurgery-not a candidate for decompressive surgery Per patient report-significant improvement in his right upper extremity/right lower extremity strength today CIR evaluation in progress.  Chronic atrial fibrillation Rate controlled Eliquis  HTN BP stable Continue amlodipine, atenolol, hydralazine  CAD s/p CABG in 2000 with subsequent PCI in 2020 PAD s/p left BKA Stable Not on antiplatelets due to patient being on Eliquis Remains on statin/Trental  DM-2 Diet controlled CBG stable  No results for input(s): "GLUCAP" in the last 72 hours.    CKD stage IIIb At baseline  BPH Flomax/finasteride  BMI: Estimated body mass index is 26.63 kg/m as calculated from the following:   Height as of this encounter: 5\' 5"  (1.651 m).   Weight as of this encounter: 72.6 kg.   Code status:   Code Status: Full Code   DVT Prophylaxis: apixaban (ELIQUIS) tablet 2.5 mg Start: 03/09/23 1000 Place and maintain sequential compression device Start: 03/08/23 1917 apixaban (ELIQUIS) tablet 2.5 mg    Family Communication: Son-Keith-[phone] updated 8/2   Disposition Plan: Status is: Inpatient Remains inpatient appropriate because: Severity of illness   Planned Discharge Destination:Rehabilitation facility   Diet: Diet Order  Diet Heart Room service appropriate? Yes; Fluid consistency: Thin  Diet effective now                     Antimicrobial agents: Anti-infectives (From admission, onward)    None        MEDICATIONS: Scheduled Meds:  amLODipine  10 mg Oral Daily   apixaban  2.5 mg Oral BID   atenolol  12.5 mg Oral Daily   finasteride  5 mg Oral Daily   fluticasone  1  spray Each Nare Daily   hydrALAZINE  75 mg Oral BID   pentoxifylline  400 mg Oral Q12H   rosuvastatin  40 mg Oral Daily   tamsulosin  0.8 mg Oral QPC supper   Continuous Infusions: PRN Meds:.acetaminophen **OR** acetaminophen (TYLENOL) oral liquid 160 mg/5 mL **OR** acetaminophen, senna-docusate   I have personally reviewed following labs and imaging studies  LABORATORY DATA: CBC: Recent Labs  Lab 03/08/23 1051 03/08/23 1137 03/09/23 0116 03/10/23 0140  WBC 6.4  --  4.7 6.0  NEUTROABS 2.7  --   --   --   HGB 11.0* 11.6* 11.1* 11.1*  HCT 35.0* 34.0* 33.7* 33.3*  MCV 90.0  --  89.6 86.5  PLT 107*  --  100* 93*    Basic Metabolic Panel: Recent Labs  Lab 03/08/23 1050 03/08/23 1137 03/09/23 0116 03/10/23 0140  NA 130* 135 134* 135  K 3.7 3.6 3.5 4.1  CL 99 100 104 103  CO2 22  --  20* 20*  GLUCOSE 106* 108* 81 77  BUN 16 16 15 20   CREATININE 1.19 1.20 1.04 1.20  CALCIUM 8.4*  --  8.1* 8.1*    GFR: Estimated Creatinine Clearance: 34.2 mL/min (by C-G formula based on SCr of 1.2 mg/dL).  Liver Function Tests: Recent Labs  Lab 03/08/23 1050 03/09/23 0116  AST 56* 50*  ALT 32 29  ALKPHOS 53 48  BILITOT 0.7 0.6  PROT 6.1* 5.6*  ALBUMIN 3.0* 2.7*   No results for input(s): "LIPASE", "AMYLASE" in the last 168 hours. No results for input(s): "AMMONIA" in the last 168 hours.  Coagulation Profile: Recent Labs  Lab 03/08/23 1050  INR 1.5*    Cardiac Enzymes: No results for input(s): "CKTOTAL", "CKMB", "CKMBINDEX", "TROPONINI" in the last 168 hours.  BNP (last 3 results) Recent Labs    09/28/22 1024 10/05/22 1412  PROBNP 2,173* 2,429*    Lipid Profile: No results for input(s): "CHOL", "HDL", "LDLCALC", "TRIG", "CHOLHDL", "LDLDIRECT" in the last 72 hours.   Thyroid Function Tests: No results for input(s): "TSH", "T4TOTAL", "FREET4", "T3FREE", "THYROIDAB" in the last 72 hours.  Anemia Panel: No results for input(s): "VITAMINB12", "FOLATE",  "FERRITIN", "TIBC", "IRON", "RETICCTPCT" in the last 72 hours.  Urine analysis:    Component Value Date/Time   COLORURINE YELLOW 03/08/2023 1336   APPEARANCEUR CLEAR 03/08/2023 1336   APPEARANCEUR Cloudy (A) 03/12/2021 1147   LABSPEC 1.010 03/08/2023 1336   PHURINE 6.0 03/08/2023 1336   GLUCOSEU NEGATIVE 03/08/2023 1336   HGBUR NEGATIVE 03/08/2023 1336   HGBUR trace-lysed 02/07/2008 1101   BILIRUBINUR NEGATIVE 03/08/2023 1336   BILIRUBINUR Negative 03/12/2021 1147   KETONESUR NEGATIVE 03/08/2023 1336   PROTEINUR 100 (A) 03/08/2023 1336   UROBILINOGEN 0.2 02/07/2008 1101   NITRITE NEGATIVE 03/08/2023 1336   LEUKOCYTESUR NEGATIVE 03/08/2023 1336    Sepsis Labs: Lactic Acid, Venous    Component Value Date/Time   LATICACIDVEN 0.8 04/27/2021 2327    MICROBIOLOGY: No results found  for this or any previous visit (from the past 240 hour(s)).  RADIOLOGY STUDIES/RESULTS: No results found.   LOS: 3 days   Jeoffrey Massed, MD  [facility]    To contact the attending provider between 7A-7P or the covering provider during after hours 7P-7A, please log into the web site [email or web address] and access using universal [place] password for that web site. If you do not have the password, please call the hospital operator.  03/12/2023, 11:53 AM

## 2023-03-12 NOTE — Plan of Care (Signed)
  Problem: Education: Goal: Knowledge of disease or condition will improve Outcome: Progressing   Problem: Ischemic Stroke/TIA Tissue Perfusion: Goal: Complications of ischemic stroke/TIA will be minimized Outcome: Progressing   Problem: Coping: Goal: Will verbalize positive feelings about self Outcome: Progressing Goal: Will identify appropriate support needs Outcome: Progressing   Problem: Health Behavior/Discharge Planning: Goal: Goals will be collaboratively established with patient/family Outcome: Progressing   Problem: Self-Care: Goal: Ability to participate in self-care as condition permits will improve Outcome: Progressing Goal: Verbalization of feelings and concerns over difficulty with self-care will improve Outcome: Progressing Goal: Ability to communicate needs accurately will improve Outcome: Progressing   Problem: Nutrition: Goal: Risk of aspiration will decrease Outcome: Progressing   Problem: Clinical Measurements: Goal: Will remain free from infection Outcome: Progressing Goal: Respiratory complications will improve Outcome: Progressing

## 2023-03-12 NOTE — Progress Notes (Signed)
Physical Therapy Treatment Patient Details Name: Eric [name]. MRN: [id number] DOB: February 14, 1928 Today's Date: 03/12/2023   History of Present Illness 89 y.o. male presents 03/08/23 with right-sided weakness starting 3 days ago. MRI brain negative for acute stroke; MRI C spine showing multi level spinal stenosis though no abnormal cord signal change. PMH significant of atrial fibrillation/flutter Eliquis, CAD s/p CABG, diet-controlled diabetes mellitus type 2, PVD s/p right BKA , and CKD stage III    PT Comments  Pt has show steady progress toward goals with notable improvement in R side weakness, though states that his finger strength is still not sufficient to doff his prosthesis.  Otherwise, pt able to show good safety and ability to stand from the recliner, transfer to the bsc and ambulate with the RW for >350 in the hall with confidence.  Pt was also able to donn the prosthesis.  With absence of the lines, pt should be safe with PRN assist of wife and children.  Can benefit from HHPT to see how he fares in his environment with his limited assist.     If plan is discharge home, recommend the following: Assistance with cooking/housework;Assist for transportation;Other (comment) (doffing prosthesis)   Can travel by private vehicle        Equipment Recommendations  None recommended by PT    Recommendations for Other Services       Precautions / Restrictions Precautions Precautions: Fall Precaution Comments: 2 recent falls; states broke L hip 6-8 wks ago (non-surgical); 2 days PTA Rt knee gave out     Mobility  Bed Mobility               General bed mobility comments: in recliner on arrival and departure    Transfers Overall transfer level: Needs assistance Equipment used: Rolling walker (2 wheels) Transfers: Sit to/from Stand Sit to Stand: Supervision   Step pivot transfers: Min guard       General transfer comment: ascent from the recliner relatively easily.   Appropriately safe    Ambulation/Gait Ambulation/Gait assistance: Supervision Gait Distance (Feet): 350 Feet Assistive device: Rolling walker (2 wheels) Gait Pattern/deviations: Step-through pattern, Decreased stride length       General Gait Details: steady with good use of the RW.  stable gait pattern.   Stairs             Wheelchair Mobility     Tilt Bed    Modified Rankin (Stroke Patients Only)       Balance Overall balance assessment: Needs assistance   Sitting balance-Leahy Scale: Good       Standing balance-Leahy Scale: Poor Standing balance comment: reliant on RW                            Cognition Arousal/Alertness: Awake/alert Behavior During Therapy: WFL for tasks assessed/performed Overall Cognitive Status: Within Functional Limits for tasks assessed                                          Exercises      General Comments General comments (skin integrity, edema, etc.): Reports normally can don prosthesis himself, but unable currently due to RUE weakness.  During the whole session appeared to be very safety conscious.      Pertinent Vitals/Pain Pain Assessment Pain Assessment: Faces Pain Score: 0-No pain Faces Pain  Scale: No hurt Pain Intervention(s): Monitored during session    Home Living                          Prior Function            PT Goals (current goals can now be found in the care plan section) Acute Rehab PT Goals PT Goal Formulation: With patient Time For Goal Achievement: 03/23/23 Potential to Achieve Goals: Good Progress towards PT goals: Progressing toward goals    Frequency    Min 1X/week      PT Plan Current plan remains appropriate    Co-evaluation              AM-PAC PT "6 Clicks" Mobility   Outcome Measure  Help needed turning from your back to your side while in a flat bed without using bedrails?: A Little Help needed moving from lying on your  back to sitting on the side of a flat bed without using bedrails?: A Little Help needed moving to and from a bed to a chair (including a wheelchair)?: A Little Help needed standing up from a chair using your arms (e.g., wheelchair or bedside chair)?: A Little Help needed to walk in hospital room?: A Little Help needed climbing 3-5 steps with a railing? : A Lot 6 Click Score: 17    End of Session   Activity Tolerance: Patient tolerated treatment well Patient left: in chair;with call bell/phone within reach;with chair alarm set Nurse Communication: Mobility status PT Visit Diagnosis: Repeated falls (R29.6);Muscle weakness (generalized) (M62.81);Difficulty in walking, not elsewhere classified (R26.2)     Time: 4098-1191 PT Time Calculation (min) (ACUTE ONLY): 41 min  Charges:    $Gait Training: 8-22 mins $Therapeutic Activity: 23-37 mins PT General Charges $$ ACUTE PT VISIT: 1 Visit                     03/12/2023  Jacinto Halim., PT Acute Rehabilitation Services [phone]  (office)   Eliseo Gum  03/12/2023, 1:52 PM

## 2023-03-12 NOTE — Discharge Instructions (Signed)

## 2023-03-12 NOTE — Progress Notes (Addendum)
TOC Expediter  Escalated to [facility] for assistance in CIR auth response.  Update: Berkley Harvey is in Well[employer] geologist Review and should receive response today.  Raynelle Highland MSW CMSW CCM

## 2023-03-12 NOTE — Progress Notes (Signed)
Inpatient Rehab Admissions Coordinator:   Notified by insurance this AM of intent to deny.  Spoke to pt regarding denial and he would prefer to discharge home with [facility].  Note he is mobilizing well, and we know from previous CIR stays that he has excellent support at home.  We will sign off at this time.  TOC and MD aware.   Estill Dooms, PT, DPT Admissions Coordinator [phone] 03/12/23  12:20 PM

## 2023-03-12 NOTE — Plan of Care (Signed)
  Problem: Education: Goal: Knowledge of disease or condition will improve Outcome: Progressing   Problem: Ischemic Stroke/TIA Tissue Perfusion: Goal: Complications of ischemic stroke/TIA will be minimized 03/12/2023 0554 by Crisoforo Oxford, RN Outcome: Progressing 03/12/2023 0410 by Crisoforo Oxford, RN Outcome: Progressing   Problem: Coping: Goal: Will verbalize positive feelings about self Outcome: Progressing Goal: Will identify appropriate support needs Outcome: Progressing   Problem: Health Behavior/Discharge Planning: Goal: Goals will be collaboratively established with patient/family Outcome: Progressing   Problem: Self-Care: Goal: Ability to participate in self-care as condition permits will improve Outcome: Progressing Goal: Verbalization of feelings and concerns over difficulty with self-care will improve Outcome: Progressing Goal: Ability to communicate needs accurately will improve Outcome: Progressing   Problem: Nutrition: Goal: Risk of aspiration will decrease Outcome: Progressing   Problem: Clinical Measurements: Goal: Will remain free from infection Outcome: Progressing Goal: Respiratory complications will improve Outcome: Progressing

## 2023-03-12 NOTE — TOC Progression Note (Addendum)
Transition of Care [facility]) - Progression Note    Patient Details  Name: Eric [name]. MRN: [id number] Date of Birth: [birth year]  Transition of Care [facility]) CM/SW Contact  Lockie Pares, RN Phone Number: 03/12/2023, 12:29 PM  Clinical Narrative:    Patient ambulating with walker 500 feet. Messaged with CIR who reassessed and does not qualify for CIR, Patient will go home with home health. Spoke to patient regarding this, he is amenable to home health and would like [facility], he has had them previously. Reached out to Amy at [facility] for acceptance. Patient states he has all DME needed at home.  TOC will continue to follow likely DC tomorrow Amy accepted for [facility] and will set up services for Monday   Expected Discharge Plan: Home w Home Health Services Barriers to Discharge: Continued Medical Work up, English as a second language teacher  Expected Discharge Plan and Services In-house Referral: Clinical Social Work   Post Acute Care Choice: IP Rehab Living arrangements for the past 2 months: Single Family Home                           HH Arranged: PT, OT, Social Work [employer] Agency: [facility] Date [facility] Agency Contacted: 03/12/23 Time HH Agency Contacted: 1228 Representative spoke with at [facility] Agency: Amy   Social Determinants of Health (SDOH) Interventions SDOH Screenings   Food Insecurity: No Food Insecurity (03/08/2023)  Housing: Low Risk  (03/08/2023)  Transportation Needs: No Transportation Needs (03/08/2023)  Utilities: Not At Risk (03/08/2023)  Alcohol Screen: Low Risk  (08/12/2022)  Depression (PHQ2-9): Low Risk  (02/12/2023)  Financial Resource Strain: Low Risk  (08/12/2022)  Physical Activity: Insufficiently Active (08/12/2022)  Social Connections: Socially Integrated (08/12/2022)  Stress: No Stress Concern Present (08/12/2022)  Tobacco Use: Medium Risk (03/08/2023)    Readmission Risk Interventions     No data to display

## 2023-03-13 DIAGNOSIS — I10 Essential (primary) hypertension: Secondary | ICD-10-CM | POA: Diagnosis not present

## 2023-03-13 DIAGNOSIS — Z7901 Long term (current) use of anticoagulants: Secondary | ICD-10-CM | POA: Diagnosis not present

## 2023-03-13 DIAGNOSIS — I639 Cerebral infarction, unspecified: Secondary | ICD-10-CM | POA: Diagnosis not present

## 2023-03-13 DIAGNOSIS — I482 Chronic atrial fibrillation, unspecified: Secondary | ICD-10-CM | POA: Diagnosis not present

## 2023-03-13 MED ORDER — GUAIFENESIN 100 MG/5ML PO LIQD
5.0000 mL | ORAL | Status: DC | PRN
  Administered 2023-03-13: 5 mL via ORAL
  Filled 2023-03-13: qty 5

## 2023-03-13 NOTE — Discharge Summary (Addendum)
PATIENT DETAILS Name: Eric [name]. Age: 89 y.o. Sex: male Date of Birth: 1927/10/10 MRN: [id number]. Admitting Physician: Starleen Arms, MD [id number], Priscille Heidelberg, MD  Admit Date: 03/08/2023 Discharge date: 03/13/2023  Recommendations for Outpatient Follow-up:  Follow up with PCP in 1-2 weeks Please obtain CMP/CBC in one week  Admitted From:  Home  Disposition: Home health   Discharge Condition: good  CODE STATUS:   Code Status: Full Code   Diet recommendation:  Diet Order             Diet - low sodium heart healthy           Diet Heart Room service appropriate? Yes; Fluid consistency: Thin  Diet effective now                    Brief Summary: Patient is a 89 y.o.  male with a history of atrial fibrillation on Eliquis, CAD s/p CABG, PAD-s/p left BKA, CKD stage IIIb, DM-2-who presented with right-sided weakness-upon further evaluation-patient was found to have severe compressive cervical spinal stenosis.   Significant events: 7/29>> admit to [facility]   Significant studies: 7/29>> CT head: No acute intracranial pathology 7/29>> MRI brain: No acute CVA. 7/29>> CT angio head/neck: No LVO, severe stenosis of proximal right ICA due to mixed density atherosclerosis, occlusion of right vertebral artery, severe stenosis of left vertebral artery V4 segment. 7/29>> echo: EF 55-60%, grade 3 diastolic dysfunction. 7/29>> LDL: 39 7/29>> A1c: 5.6 7/30>> MRI C-spine: Severe multifactorial spinal stenosis.   Significant microbiology data: None   Procedures: None   Consults: Neurology Neurosurgery CIR  Brief Hospital Course: Severe compressive cervical spinal stenosis causing right-sided hemiparesis Evaluated by neurology and then by neurosurgery-not a candidate for decompressive surgery Per patient report-significant improvement in his right upper extremity/right lower extremity strength over the past several days Initially thought to require CIR-but  insurance authorization was denied-thankfully patient has improved and is now thought to be stable to go to home health.   Chronic atrial fibrillation Rate controlled Eliquis   HTN BP stable Continue amlodipine, atenolol, hydralazine   CAD s/p CABG in 2000 with subsequent PCI in 2020 PAD s/p left BKA Stable Not on antiplatelets due to patient being on Eliquis Remains on statin/Trental   DM-2 Diet controlled-A1c 5.6 CBG stable while inpatient    CKD stage IIIb At baseline   BPH Flomax/finasteride   BMI: Estimated body mass index is 26.63 kg/m as calculated from the following:   Height as of this encounter: 5\' 5"  (1.651 m).   Weight as of this encounter: 72.6 kg.   Discharge Diagnoses:  Principal Problem:   CVA (cerebral vascular accident) (HCC) Active Problems:   ATRIAL FIBRILLATION   Chronic anticoagulation   Essential hypertension   Hyponatremia   Normocytic anemia   Chronic kidney disease (CKD), stage III (moderate) (HCC)   Type 2 diabetes mellitus (HCC)   Chronic coronary artery disease   PVD (peripheral vascular disease) (HCC)   Thrombocytopenia (HCC)   Hyperlipidemia   Discharge Instructions:  Activity:  As tolerated with Full fall precautions use walker/cane & assistance as needed  Discharge Instructions     Call MD for:  extreme fatigue   Complete by: As directed    Call MD for:  persistant dizziness or light-headedness   Complete by: As directed    Call MD for:  severe uncontrolled pain   Complete by: As directed    Diet - low sodium heart healthy  Complete by: As directed    Discharge instructions   Complete by: As directed    Follow with Primary MD  [name], MD in 1-2 weeks  Please get a complete blood count and chemistry panel checked by your Primary MD at your next visit, and again as instructed by your Primary MD.  Get Medicines reviewed and adjusted: Please take all your medications with you for your next visit with your  Primary MD  Laboratory/radiological data: Please request your Primary MD to go over all hospital tests and procedure/radiological results at the follow up, please ask your Primary MD to get all Hospital records sent to his/her office.  In some cases, they will be blood work, cultures and biopsy results pending at the time of your discharge. Please request that your primary care M.D. follows up on these results.  Also Note the following: If you experience worsening of your admission symptoms, develop shortness of breath, life threatening emergency, suicidal or homicidal thoughts you must seek medical attention immediately by calling 911 or calling your MD immediately  if symptoms less severe.  You must read complete instructions/literature along with all the possible adverse reactions/side effects for all the Medicines you take and that have been prescribed to you. Take any new Medicines after you have completely understood and accpet all the possible adverse reactions/side effects.   Do not drive when taking Pain medications or sleeping medications (Benzodaizepines)  Do not take more than prescribed Pain, Sleep and Anxiety Medications. It is not advisable to combine anxiety,sleep and pain medications without talking with your primary care practitioner  Special Instructions: If you have smoked or chewed Tobacco  in the last 2 yrs please stop smoking, stop any regular Alcohol  and or any Recreational drug use.  Wear Seat belts while driving.  Please note: You were cared for by a hospitalist during your hospital stay. Once you are discharged, your primary care physician will handle any further medical issues. Please note that NO REFILLS for any discharge medications will be authorized once you are discharged, as it is imperative that you return to your primary care physician (or establish a relationship with a primary care physician if you do not have one) for your post hospital discharge needs so  that they can reassess your need for medications and monitor your lab values.   Increase activity slowly   Complete by: As directed       Allergies as of 03/13/2023       Reactions   Altace [ramipril] Cough   Isosorbide Mononitrate Other (See Comments)   Unknown reaction   Quinidine Gluconate Rash        Medication List     STOP taking these medications    temazepam 7.5 MG capsule Commonly known as: RESTORIL       TAKE these medications    acetaminophen 325 MG tablet Commonly known as: TYLENOL Take 2 tablets (650 mg total) by mouth every 6 (six) hours as needed for mild pain (or temp >/= 101 F).   amLODipine 10 MG tablet Commonly known as: NORVASC Take 1 tablet (10 mg total) by mouth daily.   apixaban 2.5 MG Tabs tablet Commonly known as: Eliquis Take 1 tablet (2.5 mg total) by mouth 2 (two) times daily. OFFICE VISIT NEEDED FOR ADDITIONAL REFILLS   atenolol 25 MG tablet Commonly known as: TENORMIN Take 1/2 tablet (12.5 mg total) by mouth daily.   bethanechol 10 MG tablet Commonly known as: URECHOLINE TAKE  1/2 (ONE-HALF) TABLET BY MOUTH THREE TIMES DAILY What changed: See the new instructions.   Cinnamon 500 MG Tabs Take 500 mg by mouth daily.   Cranberry 500 MG Caps Take 500 mg by mouth daily.   cyanocobalamin 500 MCG tablet Commonly known as: VITAMIN B12 Take 500 mcg by mouth daily.   DAILY MULTIVITAMIN PO Take 1 tablet by mouth daily.   ferrous sulfate 325 (65 FE) MG EC tablet Take 325 mg by mouth at bedtime.   finasteride 5 MG tablet Commonly known as: PROSCAR TAKE 1 TABLET BY MOUTH ONCE DAILY   fluticasone 50 MCG/ACT nasal spray Commonly known as: FLONASE Place 1 spray into both nostrils daily.   furosemide 40 MG tablet Commonly known as: LASIX Take 40 mg by mouth daily.   hydrALAZINE 25 MG tablet Commonly known as: APRESOLINE TAKE 3 TABLETS BY MOUTH IN THE MORNING AND AT BEDTIME What changed: See the new instructions.   OneTouch  Ultra test strip Generic drug: glucose blood USE 1 STRIP TO CHECK GLUCOSE ONCE DAILY What changed: See the new instructions.   pentoxifylline 400 MG CR tablet Commonly known as: TRENTAL TAKE 1 TABLET BY MOUTH EVERY 12 HOURS . What changed:  how much to take how to take this when to take this   rosuvastatin 40 MG tablet Commonly known as: CRESTOR Take 1 tablet (40 mg total) by mouth daily.   tamsulosin 0.4 MG Caps capsule Commonly known as: FLOMAX Take 2 capsules (0.8 mg total) by mouth daily after supper.        Follow-up Information     [facility] Follow up.   Why: They will call you to set up services        Pickard, Priscille Heidelberg, MD. Schedule an appointment as soon as possible for a visit.   Specialty: Family Medicine Contact information: [address] [place] [address] [phone]                Allergies  Allergen Reactions   Altace [Ramipril] Cough   Isosorbide Mononitrate Other (See Comments)    Unknown reaction   Quinidine Gluconate Rash     Other Procedures/Studies: MR CERVICAL SPINE WO CONTRAST  Result Date: 03/09/2023 CLINICAL DATA:  Myelopathy, chronic, cervical spine. EXAM: MRI CERVICAL SPINE WITHOUT CONTRAST TECHNIQUE: Multiplanar, multisequence MR imaging of the cervical spine was performed. No intravenous contrast was administered. COMPARISON:  CT done yesterday. FINDINGS: Alignment: No malalignment. Vertebrae: No fracture or focal bone lesion. Degenerative endplate marrow changes, with mild edema at the C3-4 and C5-6 levels, which could relate to regional pain. Cord: No primary cord lesion. See below regarding stenosis from C3-4 through C5-6. Posterior Fossa, vertebral arteries, paraspinal tissues: Negative Disc levels: The foramen magnum is widely patent. There is ordinary mild osteoarthritis of the C1-2 articulation but no encroachment upon the neural structures. C2-3: Endplate osteophytes and bulging of the disc. Mild  facet and ligamentous hypertrophy. Mild canal narrowing but no compressive effect upon the cord. Bilateral foraminal narrowing which could affect the C3 nerves. C3-4: Endplate osteophytes and central disc herniation. Severe spinal stenosis in the midline with AP diameter of the canal only 4 mm. Effacement of the subarachnoid space and deformity of the cord. Bilateral foraminal stenosis could affect the C4 nerves. C4-5: Endplate osteophytes and central disc herniation. AP diameter of the canal in the midline as narrow as 6 mm. Slight indentation of the cord. Bilateral foraminal narrowing that could affect the C5 nerves.  C5-6: Endplate osteophytes and bulging of the disc. Narrowing of the ventral subarachnoid space but no compressive effect upon the cord. AP diameter of the canal in the midline 7.5 mm. Bilateral foraminal stenosis that could affect either C6 nerve. C6-7: Mild bulging of the disc. No canal stenosis. Foraminal narrowing on the left that could affect the C7 nerve. C7-T1: Mild bulging of the disc. No central canal stenosis. No compressive foraminal narrowing. IMPRESSION: 1. Severe multifactorial spinal stenosis at C3-4 with AP diameter of the canal only 4 mm. Effacement of the subarachnoid space and deformity of the cord. No abnormal cord signal at this time however. None the less, this finding could result in compressive myelopathy. 2. Spinal stenosis at C4-5 and C5-6 with AP diameter of the canal only 6 mm at C4-5 and 7.5 mm at C5-6. 3. Foraminal stenosis that could cause neural compression bilaterally at C2-3, bilateral at C3-4, bilateral at C4-5, bilateral at C5-6 and on the left at C6-7. Electronically Signed   By: Paulina Fusi M.D.   On: 03/09/2023 10:13   MR BRAIN WO CONTRAST  Result Date: 03/08/2023 CLINICAL DATA:  Right lower extremity weakness EXAM: MRI HEAD WITHOUT CONTRAST TECHNIQUE: Multiplanar, multiecho pulse sequences of the brain and surrounding structures were obtained without  intravenous contrast. COMPARISON:  None Available. FINDINGS: Brain: No acute infarct, mass effect or extra-axial collection. No acute or chronic hemorrhage. There is multifocal hyperintense T2-weighted signal within the white matter. Generalized volume loss. The midline structures are normal. Vascular: Occluded right vertebral artery. Skull and upper cervical spine: Normal calvarium and skull base. Visualized upper cervical spine and soft tissues are normal. Sinuses/Orbits:No paranasal sinus fluid levels or advanced mucosal thickening. No mastoid or middle ear effusion. Normal orbits. IMPRESSION: 1. No acute intracranial abnormality. 2. Occluded right vertebral artery. 3. Findings of chronic small vessel ischemia and volume loss. Electronically Signed   By: Deatra Robinson M.D.   On: 03/08/2023 20:22   CT ANGIO HEAD NECK W WO CM  Result Date: 03/08/2023 CLINICAL DATA:  Right-sided weakness EXAM: CT ANGIOGRAPHY HEAD AND NECK WITH AND WITHOUT CONTRAST TECHNIQUE: Multidetector CT imaging of the head and neck was performed using the standard protocol during bolus administration of intravenous contrast. Multiplanar CT image reconstructions and MIPs were obtained to evaluate the vascular anatomy. Carotid stenosis measurements (when applicable) are obtained utilizing NASCET criteria, using the distal internal carotid diameter as the denominator. RADIATION DOSE REDUCTION: This exam was performed according to the departmental dose-optimization program which includes automated exposure control, adjustment of the mA and/or kV according to patient size and/or use of iterative reconstruction technique. CONTRAST:  75mL OMNIPAQUE IOHEXOL 350 MG/ML SOLN COMPARISON:  None Available. FINDINGS: CTA NECK FINDINGS SKELETON: There is no bony spinal canal stenosis. No lytic or blastic lesion. OTHER NECK: Normal pharynx, larynx and major salivary glands. No cervical lymphadenopathy. Unremarkable thyroid gland. UPPER CHEST: No  pneumothorax or pleural effusion. No nodules or masses. AORTIC ARCH: There is calcific atherosclerosis of the aortic arch. Conventional 3 vessel aortic branching pattern. RIGHT CAROTID SYSTEM: Qualitatively severe stenosis of the proximal right ICA due to mixed density atherosclerosis. By NASCET criteria this measures approximately 80%. LEFT CAROTID SYSTEM: Atherosclerotic calcification causes moderate stenosis of the distal common carotid artery. No stenosis of the internal carotid artery. VERTEBRAL ARTERIES: Left dominant configuration.The right vertebral artery is occluded along its entire course. The left vertebral artery is normal. CTA HEAD FINDINGS POSTERIOR CIRCULATION: --Vertebral arteries: Occluded right V4 segment. Bulky atherosclerosis with severe  stenosis of the left V4 segment. --Inferior cerebellar arteries: Normal left. Right not clearly visualized. --Basilar artery: Mild atherosclerotic calcification without stenosis or occlusion. --Superior cerebellar arteries: Normal. --Posterior cerebral arteries (PCA): Normal. ANTERIOR CIRCULATION: --Intracranial internal carotid arteries: Bilateral atherosclerotic calcification causing moderate stenosis of the distal right cavernous segment. --Anterior cerebral arteries (ACA): Patent --Middle cerebral arteries (MCA): Normal. VENOUS SINUSES: As permitted by contrast timing, patent. ANATOMIC VARIANTS: None Review of the MIP images confirms the above findings. IMPRESSION: 1. No intracranial large vessel occlusion. 2. Qualitatively severe stenosis of the proximal right ICA due to mixed density atherosclerosis. 3. Occlusion of the right vertebral artery along its entire course, age indeterminate. 4. Severe stenosis of the left vertebral artery V4 segment. 5. Moderate stenosis of the distal right ICA cavernous segment. Aortic Atherosclerosis (ICD10-I70.0). Electronically Signed   By: Deatra Robinson M.D.   On: 03/08/2023 20:17   ECHOCARDIOGRAM COMPLETE  Result Date:  03/08/2023    ECHOCARDIOGRAM REPORT   Patient Name:   Eric [name]. Date of Exam: 03/08/2023 Medical Rec #:  [id number]           Height:       65.0 in Accession #:    [id number]          Weight:       160.0 lb Date of Birth:  1927-12-09           BSA:          1.799 m Patient Age:    89 years            BP:           147/89 mmHg Patient Gender: M                   HR:           53 bpm. Exam Location:  Inpatient Procedure: 2D Echo, Cardiac Doppler and Color Doppler Indications:    Stroke I63.9  History:        Patient has prior history of Echocardiogram examinations, most                 recent 03/04/2021. CAD, PAD, Stroke and CKD, Arrythmias:Atrial                 Fibrillation; Risk Factors:Dyslipidemia, Diabetes, Non-Smoker,                 Hypertension and Sleep Apnea.  Sonographer:    Dondra Prader RVT RCS Referring Phys: [id number] RONDELL A SMITH IMPRESSIONS  1. Left ventricular ejection fraction, by estimation, is 55 to 60%. The left ventricle has normal function. The left ventricle has no regional wall motion abnormalities. There is moderate concentric left ventricular hypertrophy. Left ventricular diastolic parameters are consistent with Grade III diastolic dysfunction (restrictive).  2. Right ventricular systolic function is normal. The right ventricular size is moderately enlarged. There is mildly elevated pulmonary artery systolic pressure. The estimated right ventricular systolic pressure is 42.0 mmHg.  3. Left atrial size was severely dilated.  4. Right atrial size was severely dilated.  5. The mitral valve is grossly normal. Trivial mitral valve regurgitation. Moderate mitral annular calcification.  6. Tricuspid valve regurgitation is moderate.  7. The aortic valve is calcified. There is severe calcifcation of the aortic valve. There is moderate thickening of the aortic valve. Aortic valve regurgitation is mild. Aortic valve mean gradient measures 9.0 mmHg. Aortic valve Vmax measures 2.06 m/s.  8. The  inferior vena  cava is normal in size with greater than 50% respiratory variability, suggesting right atrial pressure of 3 mmHg. FINDINGS  Left Ventricle: Left ventricular ejection fraction, by estimation, is 55 to 60%. The left ventricle has normal function. The left ventricle has no regional wall motion abnormalities. The left ventricular internal cavity size was normal in size. There is  moderate concentric left ventricular hypertrophy. Left ventricular diastolic function could not be evaluated due to atrial fibrillation. Left ventricular diastolic parameters are consistent with Grade III diastolic dysfunction (restrictive). Right Ventricle: The right ventricular size is moderately enlarged. No increase in right ventricular wall thickness. Right ventricular systolic function is normal. There is mildly elevated pulmonary artery systolic pressure. The tricuspid regurgitant velocity is 3.04 m/s, and with an assumed right atrial pressure of 5 mmHg, the estimated right ventricular systolic pressure is 42.0 mmHg. Left Atrium: Left atrial size was severely dilated. Right Atrium: Right atrial size was severely dilated. Pericardium: There is no evidence of pericardial effusion. Mitral Valve: The mitral valve is grossly normal. There is mild calcification of the mitral valve leaflet(s). Moderate mitral annular calcification. Trivial mitral valve regurgitation. Tricuspid Valve: The tricuspid valve is normal in structure. Tricuspid valve regurgitation is moderate. Aortic Valve: The aortic valve is calcified. There is severe calcifcation of the aortic valve. There is moderate thickening of the aortic valve. There is moderate aortic valve annular calcification. Aortic valve regurgitation is mild. Aortic valve mean gradient measures 9.0 mmHg. Aortic valve peak gradient measures 16.9 mmHg. Aortic valve area, by VTI measures 2.27 cm. Pulmonic Valve: The pulmonic valve was normal in structure. Pulmonic valve regurgitation is  trivial. Aorta: The aortic root and ascending aorta are structurally normal, with no evidence of dilitation. Venous: The inferior vena cava is normal in size with greater than 50% respiratory variability, suggesting right atrial pressure of 3 mmHg. IAS/Shunts: No atrial level shunt detected by color flow Doppler.  LEFT VENTRICLE PLAX 2D LVIDd:         4.70 cm   Diastology LVIDs:         3.20 cm   LV e' medial:    7.40 cm/s LV PW:         1.20 cm   LV E/e' medial:  17.7 LV IVS:        1.30 cm   LV e' lateral:   13.95 cm/s LVOT diam:     1.90 cm   LV E/e' lateral: 9.4 LV SV:         96 LV SV Index:   53 LVOT Area:     2.84 cm  RIGHT VENTRICLE             IVC RV Basal diam:  4.20 cm     IVC diam: 1.60 cm RV S prime:     13.50 cm/s TAPSE (M-mode): 1.9 cm LEFT ATRIUM              Index        RIGHT ATRIUM           Index LA diam:        5.40 cm  3.00 cm/m   RA Area:     38.20 cm LA Vol (A2C):   155.0 ml 86.15 ml/m  RA Volume:   149.00 ml 82.82 ml/m LA Vol (A4C):   130.0 ml 72.26 ml/m LA Biplane Vol: 157.0 ml 87.26 ml/m  AORTIC VALVE  PULMONIC VALVE AV Area (Vmax):    2.19 cm      PV Vmax:       1.13 m/s AV Area (Vmean):   2.15 cm      PV Peak grad:  5.1 mmHg AV Area (VTI):     2.27 cm AV Vmax:           205.67 cm/s AV Vmean:          140.000 cm/s AV VTI:            0.422 m AV Peak Grad:      16.9 mmHg AV Mean Grad:      9.0 mmHg LVOT Vmax:         159.00 cm/s LVOT Vmean:        106.333 cm/s LVOT VTI:          0.337 m LVOT/AV VTI ratio: 0.80 AR Vena Contracta: 0.80 cm  AORTA Ao Root diam: 3.60 cm Ao Asc diam:  3.60 cm MITRAL VALVE                TRICUSPID VALVE MV Area (PHT): 4.43 cm     TR Peak grad:   37.0 mmHg MV Decel Time: 171 msec     TR Vmax:        304.00 cm/s MV E velocity: 130.67 cm/s MV A velocity: 46.00 cm/s   SHUNTS MV E/A ratio:  2.84         Systemic VTI:  0.34 m                             Systemic Diam: 1.90 cm Aditya Sabharwal Electronically signed by Dorthula Nettles  Signature Date/Time: 03/08/2023/5:39:27 PM    Final    CT HEAD WO CONTRAST  Result Date: 03/08/2023 CLINICAL DATA:  Stroke suspected EXAM: CT HEAD WITHOUT CONTRAST TECHNIQUE: Contiguous axial images were obtained from the base of the skull through the vertex without intravenous contrast. RADIATION DOSE REDUCTION: This exam was performed according to the departmental dose-optimization program which includes automated exposure control, adjustment of the mA and/or kV according to patient size and/or use of iterative reconstruction technique. COMPARISON:  None Available. FINDINGS: Brain: No evidence of acute infarction, hemorrhage, hydrocephalus, extra-axial collection or mass lesion/mass effect. Periventricular white matter hypodensity. Vascular: No hyperdense vessel or unexpected calcification. Skull: Normal. Negative for fracture or focal lesion. Sinuses/Orbits: No acute finding. Other: None. IMPRESSION: No acute intracranial pathology. Small-vessel white matter disease. MRI may be used to more sensitively evaluate for acute infarction if clinically suspected. Electronically Signed   By: Jearld Lesch M.D.   On: 03/08/2023 12:47     TODAY-DAY OF DISCHARGE:  Subjective:   Eric [name] today has no headache,no chest abdominal pain,no new weakness tingling or numbness, feels much better wants to go home today.   Objective:   Blood pressure (!) 159/71, pulse 61, temperature (!) 97.4 F (36.3 C), temperature source Oral, resp. rate 20, height 5\' 5"  (1.651 m), weight 72.6 kg, SpO2 97%.  Intake/Output Summary (Last 24 hours) at 03/13/2023 0855 Last data filed at 03/12/2023 2120 Gross per 24 hour  Intake 480 ml  Output 1100 ml  Net -620 ml   Filed Weights   03/08/23 1036  Weight: 72.6 kg    Exam: Awake Alert, Oriented *3, No new F.N deficits, Normal affect [place].AT,PERRAL Supple Neck,No JVD, No cervical lymphadenopathy appriciated.  Symmetrical Chest wall movement, Good air movement bilaterally,  CTAB RRR,No Gallops,Rubs or new Murmurs, No Parasternal Heave +ve B.Sounds, Abd Soft, Non tender, No organomegaly appriciated, No rebound -guarding or rigidity. No Cyanosis, Clubbing or edema, No new Rash or bruise   PERTINENT RADIOLOGIC STUDIES: No results found.   PERTINENT LAB RESULTS: CBC: No results for input(s): "WBC", "HGB", "HCT", "PLT" in the last 72 hours. CMET CMP     Component Value Date/Time   NA 135 03/10/2023 0140   NA 139 10/05/2022 1412   NA 139 04/28/2012 0658   K 4.1 03/10/2023 0140   K 4.2 04/28/2012 0658   CL 103 03/10/2023 0140   CL 106 04/28/2012 0658   CO2 20 (L) 03/10/2023 0140   CO2 25 04/28/2012 0658   GLUCOSE 77 03/10/2023 0140   GLUCOSE 114 (H) 04/28/2012 0658   BUN 20 03/10/2023 0140   BUN 24 10/05/2022 1412   BUN 32 (H) 04/28/2012 0658   CREATININE 1.20 03/10/2023 0140   CREATININE 1.23 (H) 12/28/2022 1648   CALCIUM 8.1 (L) 03/10/2023 0140   CALCIUM 8.9 04/28/2012 0658   PROT 5.6 (L) 03/09/2023 0116   ALBUMIN 2.7 (L) 03/09/2023 0116   AST 50 (H) 03/09/2023 0116   ALT 29 03/09/2023 0116   ALKPHOS 48 03/09/2023 0116   BILITOT 0.6 03/09/2023 0116   GFR 48.95 (L) 10/10/2013 0859   EGFR 55 (L) 12/28/2022 1648   EGFR 56 (L) 10/05/2022 1412   GFRNONAA 57 (L) 03/10/2023 0140   GFRNONAA 39 (L) 12/31/2020 0833    GFR Estimated Creatinine Clearance: 34.2 mL/min (by C-G formula based on SCr of 1.2 mg/dL). No results for input(s): "LIPASE", "AMYLASE" in the last 72 hours. No results for input(s): "CKTOTAL", "CKMB", "CKMBINDEX", "TROPONINI" in the last 72 hours. Invalid input(s): "POCBNP" No results for input(s): "DDIMER" in the last 72 hours. No results for input(s): "HGBA1C" in the last 72 hours. No results for input(s): "CHOL", "HDL", "LDLCALC", "TRIG", "CHOLHDL", "LDLDIRECT" in the last 72 hours. No results for input(s): "TSH", "T4TOTAL", "T3FREE", "THYROIDAB" in the last 72 hours.  Invalid input(s): "FREET3" No results for input(s):  "VITAMINB12", "FOLATE", "FERRITIN", "TIBC", "IRON", "RETICCTPCT" in the last 72 hours. Coags: No results for input(s): "INR" in the last 72 hours.  Invalid input(s): "PT" Microbiology: No results found for this or any previous visit (from the past 240 hour(s)).  FURTHER DISCHARGE INSTRUCTIONS:  Get Medicines reviewed and adjusted: Please take all your medications with you for your next visit with your Primary MD  Laboratory/radiological data: Please request your Primary MD to go over all hospital tests and procedure/radiological results at the follow up, please ask your Primary MD to get all Hospital records sent to his/her office.  In some cases, they will be blood work, cultures and biopsy results pending at the time of your discharge. Please request that your primary care M.D. goes through all the records of your hospital data and follows up on these results.  Also Note the following: If you experience worsening of your admission symptoms, develop shortness of breath, life threatening emergency, suicidal or homicidal thoughts you must seek medical attention immediately by calling 911 or calling your MD immediately  if symptoms less severe.  You must read complete instructions/literature along with all the possible adverse reactions/side effects for all the Medicines you take and that have been prescribed to you. Take any new Medicines after you have completely understood and accpet all the possible adverse reactions/side effects.   Do not drive when taking Pain medications or sleeping medications (  Benzodaizepines)  Do not take more than prescribed Pain, Sleep and Anxiety Medications. It is not advisable to combine anxiety,sleep and pain medications without talking with your primary care practitioner  Special Instructions: If you have smoked or chewed Tobacco  in the last 2 yrs please stop smoking, stop any regular Alcohol  and or any Recreational drug use.  Wear Seat belts while  driving.  Please note: You were cared for by a hospitalist during your hospital stay. Once you are discharged, your primary care physician will handle any further medical issues. Please note that NO REFILLS for any discharge medications will be authorized once you are discharged, as it is imperative that you return to your primary care physician (or establish a relationship with a primary care physician if you do not have one) for your post hospital discharge needs so that they can reassess your need for medications and monitor your lab values.  Total Time spent coordinating discharge including counseling, education and face to face time equals greater than 30 minutes.  SignedJeoffrey Massed 03/13/2023 8:55 AM

## 2023-03-13 NOTE — Plan of Care (Signed)
  Problem: Education: Goal: Knowledge of disease or condition will improve Outcome: Progressing   Problem: Ischemic Stroke/TIA Tissue Perfusion: Goal: Complications of ischemic stroke/TIA will be minimized Outcome: Progressing   Problem: Coping: Goal: Will verbalize positive feelings about self Outcome: Progressing Goal: Will identify appropriate support needs Outcome: Progressing   Problem: Clinical Measurements: Goal: Ability to maintain clinical measurements within normal limits will improve Outcome: Progressing Goal: Will remain free from infection Outcome: Progressing Goal: Respiratory complications will improve Outcome: Progressing   Problem: Activity: Goal: Risk for activity intolerance will decrease Outcome: Progressing   Problem: Elimination: Goal: Will not experience complications related to bowel motility Outcome: Progressing   Problem: Pain Managment: Goal: General experience of comfort will improve Outcome: Progressing

## 2023-03-13 NOTE — TOC Transition Note (Signed)
Transition of Care [facility]) - CM/SW Discharge Note   Patient Details  Name: Eric [name]. MRN: [id number] Date of Birth: 1928-07-11  Transition of Care [facility]) CM/SW Contact:  Lawerance Sabal, RN Phone Number: 03/13/2023, 9:09 AM   Clinical Narrative:     Notified HH agency that patient will DC today.   Final next level of care: Home w Home Health Services Barriers to Discharge: No Barriers Identified   Patient Goals and CMS Choice CMS Medicare.gov Compare Post Acute Care list provided to:: Patient Choice offered to / list presented to : Patient  Discharge Placement                         Discharge Plan and Services Additional resources added to the After Visit Summary for   In-house Referral: Clinical Social Work   Post Acute Care Choice: IP Rehab                    HH Arranged: PT, OT, Social Work [employer] Agency: [facility] Date [facility] Agency Contacted: 03/13/23 Time HH Agency Contacted: [phone] Representative spoke with at [facility] Agency: Amy  Social Determinants of Health (SDOH) Interventions SDOH Screenings   Food Insecurity: No Food Insecurity (03/08/2023)  Housing: Low Risk  (03/08/2023)  Transportation Needs: No Transportation Needs (03/08/2023)  Utilities: Not At Risk (03/08/2023)  Alcohol Screen: Low Risk  (08/12/2022)  Depression (PHQ2-9): Low Risk  (02/12/2023)  Financial Resource Strain: Low Risk  (08/12/2022)  Physical Activity: Insufficiently Active (08/12/2022)  Social Connections: Socially Integrated (08/12/2022)  Stress: No Stress Concern Present (08/12/2022)  Tobacco Use: Medium Risk (03/08/2023)     Readmission Risk Interventions     No data to display

## 2023-03-16 ENCOUNTER — Telehealth: Payer: Self-pay

## 2023-03-16 ENCOUNTER — Telehealth: Payer: Self-pay | Admitting: Family Medicine

## 2023-03-16 ENCOUNTER — Ambulatory Visit: Payer: Self-pay

## 2023-03-16 DIAGNOSIS — G8191 Hemiplegia, unspecified affecting right dominant side: Secondary | ICD-10-CM | POA: Diagnosis not present

## 2023-03-16 DIAGNOSIS — I129 Hypertensive chronic kidney disease with stage 1 through stage 4 chronic kidney disease, or unspecified chronic kidney disease: Secondary | ICD-10-CM | POA: Diagnosis not present

## 2023-03-16 DIAGNOSIS — I2510 Atherosclerotic heart disease of native coronary artery without angina pectoris: Secondary | ICD-10-CM | POA: Diagnosis not present

## 2023-03-16 DIAGNOSIS — N1832 Chronic kidney disease, stage 3b: Secondary | ICD-10-CM | POA: Diagnosis not present

## 2023-03-16 DIAGNOSIS — Z951 Presence of aortocoronary bypass graft: Secondary | ICD-10-CM | POA: Diagnosis not present

## 2023-03-16 DIAGNOSIS — Z89511 Acquired absence of right leg below knee: Secondary | ICD-10-CM | POA: Diagnosis not present

## 2023-03-16 DIAGNOSIS — I739 Peripheral vascular disease, unspecified: Secondary | ICD-10-CM | POA: Diagnosis not present

## 2023-03-16 DIAGNOSIS — E1122 Type 2 diabetes mellitus with diabetic chronic kidney disease: Secondary | ICD-10-CM | POA: Diagnosis not present

## 2023-03-16 DIAGNOSIS — Z7901 Long term (current) use of anticoagulants: Secondary | ICD-10-CM | POA: Diagnosis not present

## 2023-03-16 DIAGNOSIS — I482 Chronic atrial fibrillation, unspecified: Secondary | ICD-10-CM | POA: Diagnosis not present

## 2023-03-16 NOTE — Telephone Encounter (Signed)
Received call from Gretchen with [facility] to request verbal orders as follows:  - 2x a week x4 weeks - 1x a week x4 weeks.  Please advise at [phone].

## 2023-03-16 NOTE — Chronic Care Management (AMB) (Signed)
   03/16/2023  Eric [name]. June 27, 1928 [id number]   Reason for Encounter: Patient is not currently enrolled in the CCM program. CCM status changed to previously enrolled  Alto Denver RN, MSN, CCM RN Care Manager  [facility]  Ambulatory Care Management  Direct Number: [phone]

## 2023-03-16 NOTE — Telephone Encounter (Signed)
Ok to leave message with verbal orders on secured voicemail. Please see previous message in thread.

## 2023-03-16 NOTE — Transitions of Care (Post Inpatient/ED Visit) (Signed)
   03/16/2023  Name: Eric [name]. MRN: [id number] DOB: [birth year]  Today's TOC FU Call Status: Today's TOC FU Call Status:: Unsuccessful Call (2nd Attempt) Unsuccessful Call (2nd Attempt) Date: 03/16/23  Attempted to reach the patient regarding the most recent Inpatient/ED visit.  Follow Up Plan: Additional outreach attempts will be made to reach the patient to complete the Transitions of Care (Post Inpatient/ED visit) call.   Jodelle Gross, RN, BSN, CCM Care Management Coordinator [place] Phone: [phone]/Fax: [phone]

## 2023-03-16 NOTE — Transitions of Care (Post Inpatient/ED Visit) (Signed)
   03/16/2023  Name: Eric [name]. MRN: [id number] DOB: 09/20/1927  Today's TOC FU Call Status: Today's TOC FU Call Status:: Unsuccessful Call (1st Attempt) Unsuccessful Call (1st Attempt) Date: 03/16/23  Attempted to reach the patient regarding the most recent Inpatient/ED visit.  Follow Up Plan: Additional outreach attempts will be made to reach the patient to complete the Transitions of Care (Post Inpatient/ED visit) call.   Jodelle Gross, RN, BSN, CCM Care Management Coordinator [place] Phone: [phone]/Fax: [phone]

## 2023-03-17 ENCOUNTER — Telehealth: Payer: Self-pay | Admitting: Family Medicine

## 2023-03-17 ENCOUNTER — Telehealth: Payer: Self-pay

## 2023-03-17 DIAGNOSIS — M4802 Spinal stenosis, cervical region: Secondary | ICD-10-CM | POA: Diagnosis not present

## 2023-03-17 NOTE — Telephone Encounter (Signed)
Will from [facility] calling to get verbal orders for Occupational Therapy.  CB# [phone]

## 2023-03-17 NOTE — Transitions of Care (Post Inpatient/ED Visit) (Signed)
   03/17/2023  Name: Eric [name]. MRN: [id number] DOB: [birth year]  Today's TOC FU Call Status: Today's TOC FU Call Status:: Unsuccessful Call (3rd Attempt) Unsuccessful Call (3rd Attempt) Date: 03/17/23  Attempted to reach the patient regarding the most recent Inpatient/ED visit.  Follow Up Plan: No further outreach attempts will be made at this time. We have been unable to contact the patient.  Jodelle Gross, RN, BSN, CCM Care Management Coordinator [place] Phone: [phone]/Fax: [phone]

## 2023-03-19 ENCOUNTER — Ambulatory Visit (INDEPENDENT_AMBULATORY_CARE_PROVIDER_SITE_OTHER): Payer: PPO | Admitting: Family Medicine

## 2023-03-19 ENCOUNTER — Encounter: Payer: Self-pay | Admitting: Family Medicine

## 2023-03-19 VITALS — BP 132/64 | HR 60 | Temp 98.0°F | Ht 65.0 in | Wt 159.6 lb

## 2023-03-19 DIAGNOSIS — L84 Corns and callosities: Secondary | ICD-10-CM

## 2023-03-19 DIAGNOSIS — E11628 Type 2 diabetes mellitus with other skin complications: Secondary | ICD-10-CM | POA: Diagnosis not present

## 2023-03-19 DIAGNOSIS — I6521 Occlusion and stenosis of right carotid artery: Secondary | ICD-10-CM

## 2023-03-19 DIAGNOSIS — M4802 Spinal stenosis, cervical region: Secondary | ICD-10-CM

## 2023-03-19 DIAGNOSIS — I482 Chronic atrial fibrillation, unspecified: Secondary | ICD-10-CM | POA: Diagnosis not present

## 2023-03-19 NOTE — Progress Notes (Signed)
Subjective:    Patient ID: Eric [name]., male    DOB: [birth year], 89 y.o.   MRN: [id number]     Admit Date: 03/08/2023 Discharge date: 03/13/2023    Brief Summary: Patient is a 89 y.o.  male with a history of atrial fibrillation on Eliquis, CAD s/p CABG, PAD-s/p left BKA, CKD stage IIIb, DM-2-who presented with right-sided weakness-upon further evaluation-patient was found to have severe compressive cervical spinal stenosis.   Significant events: 7/29>> admit to [facility]   Significant studies: 7/29>> CT head: No acute intracranial pathology 7/29>> MRI brain: No acute CVA. 7/29>> CT angio head/neck: No LVO, severe stenosis of proximal right ICA due to mixed density atherosclerosis, occlusion of right vertebral artery, severe stenosis of left vertebral artery V4 segment. 7/29>> echo: EF 55-60%, grade 3 diastolic dysfunction. 7/29>> LDL: 39 7/29>> A1c: 5.6 7/30>> MRI C-spine: Severe multifactorial spinal stenosis.   Brief Hospital Course: Severe compressive cervical spinal stenosis causing right-sided hemiparesis Evaluated by neurology and then by neurosurgery-not a candidate for decompressive surgery Per patient report-significant improvement in his right upper extremity/right lower extremity strength over the past several days Initially thought to require CIR-but insurance authorization was denied-thankfully patient has improved and is now thought to be stable to go to home health.   Chronic atrial fibrillation Rate controlled Eliquis   HTN BP stable Continue amlodipine, atenolol, hydralazine   CAD s/p CABG in 2000 with subsequent PCI in 2020 PAD s/p left BKA Stable Not on antiplatelets due to patient being on Eliquis Remains on statin/Trental   DM-2 Diet controlled-A1c 5.6 CBG stable while inpatient    CKD stage IIIb At baseline   BPH Flomax/finasteride   BMI: Estimated body mass index is 26.63 kg/m as calculated from the following:   Height as of this  encounter: 5\' 5"  (1.651 m).   Weight as of this encounter: 72.6 kg.    Discharge Diagnoses:  Principal Problem:   CVA (cerebral vascular accident) (HCC) Active Problems:   ATRIAL FIBRILLATION   Chronic anticoagulation   Essential hypertension   Hyponatremia   Normocytic anemia   Chronic kidney disease (CKD), stage III (moderate) (HCC)   Type 2 diabetes mellitus (HCC)   Chronic coronary artery disease   PVD (peripheral vascular disease) (HCC)   Thrombocytopenia (HCC)   Hyperlipidemia       Patient is here today for follow-up.  He states that he suddenly developed weakness in his right arm and right leg.  He was convinced that he was having a stroke.  However MRI of the brain confirmed no stroke.  He does have a stenosis in the right internal carotid artery on CT angiogram as well as a vertebral artery gnosis.  However MRI of the neck confirms severe spinal stenosis at C3-C4.  Surprisingly, the patient denies any pain in his neck.  The strength is returning in his arm and his leg.  Muscle strength is 5/5 in the left arm and 3-4/5 in the right arm and in the right leg.  He is walking using a walker.  He denies any falling.  He denies any cough or fever or chest pain or shortness of breath since his hospitalization.  Past Medical History:  Diagnosis Date   Atrial flutter (HCC)    s/p RFCA   CAD (coronary artery disease)    cath 2003, occluded S-RCA, occluded S-Dx, L-LAD ok, s/p PTCA to LAD   Cataract    CKD (chronic kidney disease) stage 3, GFR 30-59 ml/min (  HCC)    Diabetes mellitus    diet controlled   Hearing aid worn    bilateral   History of kidney stones    Long term (current) use of anticoagulants    Neuromuscular disorder (HCC)    OSA (obstructive sleep apnea) 12/11   very mild, AHI 7/hr   Persistent atrial fibrillation (HCC) 09/26/2015   Pure hyperglyceridemia    PVD (peripheral vascular disease) (HCC)    angioplasty of his right lower extremity in [place] by Dr.[name]  2013   Unspecified essential hypertension    Wears dentures    full upper and lower   Past Surgical History:  Procedure Laterality Date   ABDOMINAL AORTOGRAM W/LOWER EXTREMITY N/A 11/15/2017   Procedure: ABDOMINAL AORTOGRAM W/LOWER EXTREMITY;  Surgeon: Sherren Kerns, MD;  Location: [facility] INVASIVE CV [facility];  Service: Cardiovascular;  Laterality: N/A;   Adenosine Myoview  3/06   EF 56%, neg. Ischemia   Adenosine Myoview  02/18/07   nml   AMPUTATION Left 12/15/2017   Procedure: AMPUTATION BELOW KNEE;  Surgeon: Annice Needy, MD;  Location: [facility] ORS;  Service: Vascular;  Laterality: Left;   ANGIOPLASTY  1/99   CAD- diogonal with rotational artherectomy   Arthrectomy     of LAD & PTCA   BLEPHAROPLASTY Bilateral    CARDIAC CATHETERIZATION  1/00   CARDIOVERSION  1/04   CARDIOVERSION  5/07   hospital- a flutter   CARPAL TUNNEL RELEASE     ? bilateral   CATARACT EXTRACTION W/PHACO Right 07/28/2017   Procedure: CATARACT EXTRACTION PHACO AND INTRAOCULAR LENS PLACEMENT (IOC) RIGHT DIABETIC;  Surgeon: Lockie Mola, MD;  Location: [facility];  Service: Ophthalmology;  Laterality: Right;  Diabetic - diet controlled   CATARACT EXTRACTION W/PHACO Left 08/18/2017   Procedure: CATARACT EXTRACTION PHACO AND INTRAOCULAR LENS PLACEMENT (IOC);  Surgeon: Lockie Mola, MD;  Location: [facility];  Service: Ophthalmology;  Laterality: Left;  DIABETES - oral meds   COLONOSCOPY W/ BIOPSIES  10/01/06   sigmoid polyp bx neg, 3 years   COLONOSCOPY WITH PROPOFOL N/A 04/28/2021   Procedure: COLONOSCOPY WITH PROPOFOL;  Surgeon: Iva Boop, MD;  Location: [facility] ENDOSCOPY;  Service: Endoscopy;  Laterality: N/A;   CORONARY ANGIOPLASTY  4/03   cutting balloon PTCA pLAD into Diag   CORONARY ARTERY BYPASS GRAFT  2000   LIMA-LAD, SVG-RCA, SVG-Diag; SVG-Diag & SVG-RCA occluded 2003   FRACTURE SURGERY     HAND SURGERY  08/27/09   R thumb procedure wit Scaphoid Gragt and screws, Dr Merlyn Lot   LOWER  EXTREMITY ANGIOGRAPHY Right 01/25/2019   Procedure: LOWER EXTREMITY ANGIOGRAPHY;  Surgeon: Renford Dills, MD;  Location: [facility] INVASIVE CV LAB;  Service: Cardiovascular;  Laterality: Right;   NM MYOVIEW LTD  4/11   normal   PERIPHERAL VASCULAR CATHETERIZATION Right 06/27/2015   Procedure: Lower Extremity Angiography;  Surgeon: Annice Needy, MD;  Location: [facility] INVASIVE CV LAB;  Service: Cardiovascular;  Laterality: Right;   PERIPHERAL VASCULAR CATHETERIZATION  06/27/2015   Procedure: Lower Extremity Intervention;  Surgeon: Annice Needy, MD;  Location: [facility] INVASIVE CV LAB;  Service: Cardiovascular;;   POLYPECTOMY  04/28/2021   Procedure: POLYPECTOMY;  Surgeon: Iva Boop, MD;  Location: [facility] ENDOSCOPY;  Service: Endoscopy;;   Current Outpatient Medications on File Prior to Visit  Medication Sig Dispense Refill   acetaminophen (TYLENOL) 325 MG tablet Take 2 tablets (650 mg total) by mouth every 6 (six) hours as needed for mild pain (or temp >/=  101 F).     amLODipine (NORVASC) 10 MG tablet Take 1 tablet (10 mg total) by mouth daily. 90 tablet 1   apixaban (ELIQUIS) 2.5 MG TABS tablet Take 1 tablet (2.5 mg total) by mouth 2 (two) times daily. OFFICE VISIT NEEDED FOR ADDITIONAL REFILLS 180 tablet 1   atenolol (TENORMIN) 25 MG tablet Take 1/2 tablet (12.5 mg total) by mouth daily. 15 tablet 3   bethanechol (URECHOLINE) 10 MG tablet TAKE 1/2 (ONE-HALF) TABLET BY MOUTH THREE TIMES DAILY 45 tablet 0   Cinnamon 500 MG TABS Take 500 mg by mouth daily.     Cranberry 500 MG CAPS Take 500 mg by mouth daily.     cyanocobalamin (VITAMIN B12) 500 MCG tablet Take 500 mcg by mouth daily.     ferrous sulfate 325 (65 FE) MG EC tablet Take 325 mg by mouth at bedtime.     finasteride (PROSCAR) 5 MG tablet TAKE 1 TABLET BY MOUTH ONCE DAILY (Patient taking differently: Take 5 mg by mouth daily.) 90 tablet 3   fluticasone (FLONASE) 50 MCG/ACT nasal spray Place 1 spray into both nostrils daily.  2   furosemide  (LASIX) 40 MG tablet Take 40 mg by mouth daily.     hydrALAZINE (APRESOLINE) 25 MG tablet TAKE 3 TABLETS BY MOUTH IN THE MORNING AND AT BEDTIME (Patient taking differently: Take 75 mg by mouth 2 (two) times daily.) 180 tablet 2   Multiple Vitamin (DAILY MULTIVITAMIN PO) Take 1 tablet by mouth daily.     ONETOUCH ULTRA test strip USE 1 STRIP TO CHECK GLUCOSE ONCE DAILY (Patient taking differently: 1 each by Other route See admin instructions. USE 1 STRIP TO CHECK GLUCOSE ONCE DAILY) 50 each 0   pentoxifylline (TRENTAL) 400 MG CR tablet TAKE 1 TABLET BY MOUTH EVERY 12 HOURS . (Patient taking differently: Take 400 mg by mouth every 12 (twelve) hours. TAKE 1 TABLET BY MOUTH EVERY 12 HOURS .) 180 tablet 1   rosuvastatin (CRESTOR) 40 MG tablet Take 1 tablet (40 mg total) by mouth daily. 90 tablet 1   tamsulosin (FLOMAX) 0.4 MG CAPS capsule Take 2 capsules (0.8 mg total) by mouth daily after supper. 60 capsule 3   No current facility-administered medications on file prior to visit.   Allergies  Allergen Reactions   Altace [Ramipril] Cough   Isosorbide Mononitrate Other (See Comments)    Unknown reaction   Quinidine Gluconate Rash   Social History   Socioeconomic History   Marital status: Married    Spouse name: [name]   Number of children: 5   Years of education: Not on file   Highest education level: Not on file  Occupational History   Occupation: [employer]    Employer: RETIRED  Tobacco Use   Smoking status: Never   Smokeless tobacco: Former    Quit date: 1994  Vaping Use   Vaping status: Never Used  Substance and Sexual Activity   Alcohol use: No   Drug use: No   Sexual activity: Never  Other Topics Concern   Not on file  Social History Narrative   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   5 kids   11 grandchildren.   20 great grandchilren.   Social Determinants of Health   Financial Resource Strain: Low Risk   (08/12/2022)   Overall Financial Resource Strain (CARDIA)    Difficulty of Paying Living Expenses: Not hard at all  Food Insecurity: No Food Insecurity (03/08/2023)   Hunger Vital Sign    Worried About Running Out of Food in the Last Year: Never true    Ran Out of Food in the Last Year: Never true  Transportation Needs: No Transportation Needs (03/08/2023)   PRAPARE - Administrator, Civil Service (Medical): No    Lack of Transportation (Non-Medical): No  Physical Activity: Insufficiently Active (08/12/2022)   Exercise Vital Sign    Days of Exercise per Week: 5 days    Minutes of Exercise per Session: 20 min  Stress: No Stress Concern Present (08/12/2022)   [employer] of Occupational Health - Occupational Stress Questionnaire    Feeling of Stress : Not at all  Social Connections: Socially Integrated (08/12/2022)   Social Connection and Isolation Panel [NHANES]    Frequency of Communication with Friends and Family: More than three times a week    Frequency of Social Gatherings with Friends and Family: More than three times a week    Attends Religious Services: More than 4 times per year    Active Member of [employer] or Organizations: Yes    Attends Banker Meetings: More than 4 times per year    Marital Status: Married  Catering manager Violence: Not At Risk (03/08/2023)   Humiliation, Afraid, Rape, and Kick questionnaire    Fear of Current or Ex-Partner: No    Emotionally Abused: No    Physically Abused: No    Sexually Abused: No      Review of Systems  All other systems reviewed and are negative.      Objective:   Physical Exam Vitals reviewed.  Constitutional:      Appearance: Normal appearance.  Cardiovascular:     Rate and Rhythm: Normal rate. Rhythm irregular.     Pulses: Normal pulses.     Heart sounds: Normal heart sounds.  Pulmonary:     Effort: Pulmonary effort is normal.     Breath sounds: Normal breath sounds. No wheezing, rhonchi or  rales.  Abdominal:     General: Abdomen is flat.     Palpations: Abdomen is soft.  Musculoskeletal:     Right lower leg: Edema present.  Neurological:     Mental Status: He is alert.           Assessment & Plan:  Chronic atrial fibrillation (HCC)  Type 2 diabetes mellitus with pressure callus (HCC)  Stenosis of right carotid artery  Spinal stenosis of cervical region Patient is doing remarkably well.  His pain is minimal.  The strength in his arm and his leg is returning.  He is already working with physical therapy.  Given his age she is not a candidate for CEA of the right internal carotid artery.  His blood pressure is excellent.  His A1c is outstanding.  His LDL cholesterol is well below 55.  He is appropriately coagulated on Eliquis.  Therefore I feel we are doing everything possible to control his risk factors.  We discussed this at length.  Patient is doing well at the present time denies any concerns that need to be addressed

## 2023-03-22 NOTE — Telephone Encounter (Signed)
Disregard, open in error.

## 2023-03-28 ENCOUNTER — Encounter: Payer: Self-pay | Admitting: Pharmacist

## 2023-03-28 NOTE — Progress Notes (Signed)
Pharmacy Quality Measure Review  This patient is appearing on a report for being at risk of failing the adherence measure for cholesterol (statin) medications this calendar year.   Medication: rosuvastatin 40 mg daily Last fill date: 02/22/23 for 90 day supply  Insurance report was not up to date. No action needed at this time.   Adam Phenix, PharmD PGY-1 Pharmacy Resident

## 2023-03-29 NOTE — Progress Notes (Unsigned)
Cardiology Office Note   Date:  03/30/2023   ID:  [name], DOB 15-Apr-1928, MRN [id number]  PCP:  [name], MD  Cardiologist:   Dietrich Pates, MD   Pt presents fro f/u of CAD      History of Present Illness: Eric [name]. is a 89 y.o. male with a history of CAD   He is s/p CABG in 2000 (LIMA to LAD; SVG to Diag; SVG to PDA)   In 2003 he underwent PTCA of LAD.  Pt also has a Hx of HTN, permanent atrial fib; CKD Stage III, DM, PAD (followed at [facility]; s/p L BKA )  I saw the pt in Feb 2024  He was admitted at the end of July 2024 for R sided weakness  Found to have severe cervical stenosis   No CVA  Not a candidate for surgery   He denies CP  Breathing is stable   No palpitations    He says his R side is getting stronger   Current Meds  Medication Sig   acetaminophen (TYLENOL) 325 MG tablet Take 2 tablets (650 mg total) by mouth every 6 (six) hours as needed for mild pain (or temp >/= 101 F).   amLODipine (NORVASC) 10 MG tablet Take 1 tablet (10 mg total) by mouth daily.   apixaban (ELIQUIS) 2.5 MG TABS tablet Take 1 tablet (2.5 mg total) by mouth 2 (two) times daily. OFFICE VISIT NEEDED FOR ADDITIONAL REFILLS   atenolol (TENORMIN) 25 MG tablet Take 1/2 tablet (12.5 mg total) by mouth daily.   bethanechol (URECHOLINE) 10 MG tablet TAKE 1/2 (ONE-HALF) TABLET BY MOUTH THREE TIMES DAILY   Cinnamon 500 MG TABS Take 500 mg by mouth daily.   Cranberry 500 MG CAPS Take 500 mg by mouth daily.   cyanocobalamin (VITAMIN B12) 500 MCG tablet Take 500 mcg by mouth daily.   ferrous sulfate 325 (65 FE) MG EC tablet Take 325 mg by mouth at bedtime.   finasteride (PROSCAR) 5 MG tablet TAKE 1 TABLET BY MOUTH ONCE DAILY (Patient taking differently: Take 5 mg by mouth daily.)   fluticasone (FLONASE) 50 MCG/ACT nasal spray Place 1 spray into both nostrils daily.   furosemide (LASIX) 40 MG tablet Take 40 mg by mouth daily.   hydrALAZINE (APRESOLINE) 25 MG tablet TAKE 3 TABLETS BY  MOUTH IN THE MORNING AND AT BEDTIME (Patient taking differently: Take 75 mg by mouth 2 (two) times daily.)   Multiple Vitamin (DAILY MULTIVITAMIN PO) Take 1 tablet by mouth daily.   ONETOUCH ULTRA test strip USE 1 STRIP TO CHECK GLUCOSE ONCE DAILY (Patient taking differently: 1 each by Other route See admin instructions. USE 1 STRIP TO CHECK GLUCOSE ONCE DAILY)   pentoxifylline (TRENTAL) 400 MG CR tablet TAKE 1 TABLET BY MOUTH EVERY 12 HOURS . (Patient taking differently: Take 400 mg by mouth every 12 (twelve) hours. TAKE 1 TABLET BY MOUTH EVERY 12 HOURS .)   rosuvastatin (CRESTOR) 40 MG tablet Take 1 tablet (40 mg total) by mouth daily.   tamsulosin (FLOMAX) 0.4 MG CAPS capsule Take 2 capsules (0.8 mg total) by mouth daily after supper.     Allergies:   Altace [ramipril], Isosorbide mononitrate, and Quinidine gluconate   Past Medical History:  Diagnosis Date   Atrial flutter (HCC)    s/p RFCA   CAD (coronary artery disease)    cath 2003, occluded S-RCA, occluded S-Dx, L-LAD ok, s/p PTCA to LAD   Cataract  CKD (chronic kidney disease) stage 3, GFR 30-59 ml/min (HCC)    Diabetes mellitus    diet controlled   Hearing aid worn    bilateral   History of kidney stones    Long term (current) use of anticoagulants    Neuromuscular disorder (HCC)    OSA (obstructive sleep apnea) 12/11   very mild, AHI 7/hr   Persistent atrial fibrillation (HCC) 09/26/2015   Pure hyperglyceridemia    PVD (peripheral vascular disease) (HCC)    angioplasty of his right lower extremity in [place] by Dr.[name] 2013   Unspecified essential hypertension    Wears dentures    full upper and lower    Past Surgical History:  Procedure Laterality Date   ABDOMINAL AORTOGRAM W/LOWER EXTREMITY N/A 11/15/2017   Procedure: ABDOMINAL AORTOGRAM W/LOWER EXTREMITY;  Surgeon: Sherren Kerns, MD;  Location: [facility];  Service: Cardiovascular;  Laterality: N/A;   Adenosine Myoview  3/06   EF 56%, neg. Ischemia    Adenosine Myoview  02/18/07   nml   AMPUTATION Left 12/15/2017   Procedure: AMPUTATION BELOW KNEE;  Surgeon: Annice Needy, MD;  Location: [facility] ORS;  Service: Vascular;  Laterality: Left;   ANGIOPLASTY  1/99   CAD- diogonal with rotational artherectomy   Arthrectomy     of LAD & PTCA   BLEPHAROPLASTY Bilateral    CARDIAC CATHETERIZATION  1/00   CARDIOVERSION  1/04   CARDIOVERSION  5/07   hospital- a flutter   CARPAL TUNNEL RELEASE     ? bilateral   CATARACT EXTRACTION W/PHACO Right 07/28/2017   Procedure: CATARACT EXTRACTION PHACO AND INTRAOCULAR LENS PLACEMENT (IOC) RIGHT DIABETIC;  Surgeon: Lockie Mola, MD;  Location: [facility];  Service: Ophthalmology;  Laterality: Right;  Diabetic - diet controlled   CATARACT EXTRACTION W/PHACO Left 08/18/2017   Procedure: CATARACT EXTRACTION PHACO AND INTRAOCULAR LENS PLACEMENT (IOC);  Surgeon: Lockie Mola, MD;  Location: [facility];  Service: Ophthalmology;  Laterality: Left;  DIABETES - oral meds   COLONOSCOPY W/ BIOPSIES  10/01/06   sigmoid polyp bx neg, 3 years   COLONOSCOPY WITH PROPOFOL N/A 04/28/2021   Procedure: COLONOSCOPY WITH PROPOFOL;  Surgeon: Iva Boop, MD;  Location: [facility] ENDOSCOPY;  Service: Endoscopy;  Laterality: N/A;   CORONARY ANGIOPLASTY  4/03   cutting balloon PTCA pLAD into Diag   CORONARY ARTERY BYPASS GRAFT  2000   LIMA-LAD, SVG-RCA, SVG-Diag; SVG-Diag & SVG-RCA occluded 2003   FRACTURE SURGERY     HAND SURGERY  08/27/09   R thumb procedure wit Scaphoid Gragt and screws, Dr Merlyn Lot   LOWER EXTREMITY ANGIOGRAPHY Right 01/25/2019   Procedure: LOWER EXTREMITY ANGIOGRAPHY;  Surgeon: Renford Dills, MD;  Location: [facility] INVASIVE CV LAB;  Service: Cardiovascular;  Laterality: Right;   NM MYOVIEW LTD  4/11   normal   PERIPHERAL VASCULAR CATHETERIZATION Right 06/27/2015   Procedure: Lower Extremity Angiography;  Surgeon: Annice Needy, MD;  Location: [facility] INVASIVE CV LAB;  Service:  Cardiovascular;  Laterality: Right;   PERIPHERAL VASCULAR CATHETERIZATION  06/27/2015   Procedure: Lower Extremity Intervention;  Surgeon: Annice Needy, MD;  Location: [facility] INVASIVE CV LAB;  Service: Cardiovascular;;   POLYPECTOMY  04/28/2021   Procedure: POLYPECTOMY;  Surgeon: Iva Boop, MD;  Location: [facility] ENDOSCOPY;  Service: Endoscopy;;     Social History:  The patient  reports that he has never smoked. He quit smokeless tobacco use about 30 years ago. He reports that he does not  drink alcohol and does not use drugs.   Family History:  The patient's family history includes Aneurysm in his father; Breast cancer in his sister; Hypertension in his father and mother; Liver cancer in his brother; Lung cancer in his brother and brother; Other in his brother and sister; Prostate cancer in his brother; Stroke in his father; Thrombosis in his brother.    ROS:  Please see the history of present illness. All other systems are reviewed and  Negative to the above problem except as noted.    PHYSICAL EXAM: VS:  BP (!) 132/54   Pulse 68   Ht 5\' 5"  (1.651 m)   Wt 160 lb (72.6 kg)   SpO2 98%   BMI 26.63 kg/m   GEN: Well nourished, well developed, in no acute distress  Neck: no JVD, Cardiac: Irreg  irreg  NO murmurs  No RLE edema     Respiratory:  clear to auscultation  GII: soft, nontender, nondistended   No hepatomegaly  MS:  S/p L BKA    EKG:  EKG is not ordered today.    Lipid Panel    Component Value Date/Time   CHOL 88 03/08/2023 1049   CHOL 83 (L) 12/09/2020 1140   TRIG 80 03/08/2023 1049   HDL 33 (L) 03/08/2023 1049   HDL 30 (L) 12/09/2020 1140   CHOLHDL 2.7 03/08/2023 1049   VLDL 16 03/08/2023 1049   LDLCALC 39 03/08/2023 1049   LDLCALC 44 11/16/2022 1113   LDLDIRECT 99.1 02/19/2011 0823      Wt Readings from Last 3 Encounters:  03/30/23 160 lb (72.6 kg)  03/19/23 159 lb 9.6 oz (72.4 kg)  03/08/23 160 lb (72.6 kg)      ASSESSMENT AND PLAN:  1  CAD Pt s/p  remote  CABG and remote percutaneous intervention (2003).  Denies anginal symptoms   Volume status is good    Follow  2  Bradycardia  Rates OK   3  Permanent afib   Continue rate control  Continue H/H  4    HTN  BP is ok     5  PAD   Followed at [facility].  Continue Crestor    6  HL  LDL 39 HDL 33 Trig 80  F/U in 1 year   SOoner for problems      Current medicines are reviewed at length with the patient today.  The patient does not have concerns regarding medicines.  Signed, Dietrich Pates, MD  03/30/2023 8:58 AM    [facility] [address] [place], [place], [place]  [address] Phone: [phone]; Fax: [phone]

## 2023-03-30 ENCOUNTER — Encounter: Payer: Self-pay | Admitting: Internal Medicine

## 2023-03-30 ENCOUNTER — Ambulatory Visit: Payer: PPO | Attending: Internal Medicine | Admitting: Internal Medicine

## 2023-03-30 VITALS — BP 132/54 | HR 68 | Ht 65.0 in | Wt 160.0 lb

## 2023-03-30 DIAGNOSIS — I2510 Atherosclerotic heart disease of native coronary artery without angina pectoris: Secondary | ICD-10-CM | POA: Diagnosis not present

## 2023-03-30 NOTE — Patient Instructions (Signed)
Medication Instructions:   *If you need a refill on your cardiac medications before your next appointment, please call your pharmacy*   Lab Work:  If you have labs (blood work) drawn today and your tests are completely normal, you will receive your results only by: MyChart Message (if you have MyChart) OR A paper copy in the mail If you have any lab test that is abnormal or we need to change your treatment, we will call you to review the results.   Testing/Procedures:    Follow-Up: At [facility], you and your health needs are our priority.  As part of our continuing mission to provide you with exceptional heart care, we have created designated Provider Care Teams.  These Care Teams include your primary Cardiologist (physician) and Advanced Practice Providers (APPs -  Physician Assistants and Nurse Practitioners) who all work together to provide you with the care you need, when you need it.  We recommend signing up for the patient portal called "MyChart".  Sign up information is provided on this After Visit Summary.  MyChart is used to connect with patients for Virtual Visits (Telemedicine).  Patients are able to view lab/test results, encounter notes, upcoming appointments, etc.  Non-urgent messages can be sent to your provider as well.   To learn more about what you can do with MyChart, go to [email or web address].    Your next appointment:   1 year(s)  Provider:   Paula Ross, MD     Other Instructions   

## 2023-04-06 ENCOUNTER — Other Ambulatory Visit: Payer: Self-pay | Admitting: Family Medicine

## 2023-04-06 DIAGNOSIS — R339 Retention of urine, unspecified: Secondary | ICD-10-CM

## 2023-04-07 NOTE — Telephone Encounter (Signed)
Due to a system glitch the last office visit for this practice is not detected.    LOV 03/19/2023.  Requested Prescriptions  Pending Prescriptions Disp Refills   bethanechol (URECHOLINE) 10 MG tablet [Pharmacy Med Name: Bethanechol Chloride 10 MG Oral Tablet] 45 tablet 1    Sig: TAKE 1/2 (ONE-HALF) TABLET BY MOUTH THREE TIMES DAILY     Urology:  Bladder Agents Failed - 04/06/2023  9:25 AM      Failed - Valid encounter within last 12 months    Recent Outpatient Visits           1 year ago Wheezing   [facility] [name], NP   1 year ago Immunization due   [facility] Pickard, Priscille Heidelberg, MD   1 year ago Diverticulosis   [facility] [name], MD   2 years ago Type 2 diabetes mellitus with diabetic peripheral angiopathy with gangrene, unspecified whether long term insulin use (HCC)   [facility] Pickard, Priscille Heidelberg, MD   2 years ago Hematuria, unspecified type   [facility] [name], NP

## 2023-04-20 DIAGNOSIS — H35712 Central serous chorioretinopathy, left eye: Secondary | ICD-10-CM | POA: Diagnosis not present

## 2023-04-20 DIAGNOSIS — H353221 Exudative age-related macular degeneration, left eye, with active choroidal neovascularization: Secondary | ICD-10-CM | POA: Diagnosis not present

## 2023-04-26 ENCOUNTER — Other Ambulatory Visit: Payer: Self-pay | Admitting: Family Medicine

## 2023-04-26 DIAGNOSIS — I4892 Unspecified atrial flutter: Secondary | ICD-10-CM

## 2023-04-27 NOTE — Telephone Encounter (Signed)
Requested medication (s) are due for refill today: yes  Requested medication (s) are on the active medication list: yes    Last refill: 10/02/22  #180  1 refill  Future visit scheduled No  Notes to clinic:Failed due to labs, please review. Thank you.  Requested Prescriptions  Pending Prescriptions Disp Refills   ELIQUIS 2.5 MG TABS tablet [Pharmacy Med Name: Eliquis 2.5 MG Oral Tablet] 180 tablet 0    Sig: TAKE 1 TABLET BY MOUTH TWICE DAILY (OFFICE  VISIT  NEEDED  FOR  ADDITIONAL  REFILLS)     Hematology:  Anticoagulants - apixaban Failed - 04/26/2023 12:32 PM      Failed - PLT in normal range and within 360 days    Platelets  Date Value Ref Range Status  03/10/2023 93 (L) 150 - 400 K/uL Final    Comment:    Immature Platelet Fraction may be clinically indicated, consider ordering this additional test [id number] REPEATED TO VERIFY   09/28/2022 216 150 - 450 x10E3/uL Final         Failed - HGB in normal range and within 360 days    Hemoglobin  Date Value Ref Range Status  03/10/2023 11.1 (L) 13.0 - 17.0 g/dL Final  47/42/5956 38.7 (L) 13.0 - 17.7 g/dL Final         Failed - HCT in normal range and within 360 days    HCT  Date Value Ref Range Status  03/10/2023 33.3 (L) 39.0 - 52.0 % Final   Hematocrit  Date Value Ref Range Status  09/28/2022 35.2 (L) 37.5 - 51.0 % Final         Failed - AST in normal range and within 360 days    AST  Date Value Ref Range Status  03/09/2023 50 (H) 15 - 41 U/L Final         Failed - Valid encounter within last 12 months    Recent Outpatient Visits           1 year ago Wheezing   [facility] [name], NP   1 year ago Immunization due   [facility] Pickard, Priscille Heidelberg, MD   1 year ago Diverticulosis   [facility] [name], MD   2 years ago Type 2 diabetes mellitus with diabetic peripheral angiopathy with gangrene, unspecified whether long term insulin use (HCC)    [facility] Pickard, Priscille Heidelberg, MD   2 years ago Hematuria, unspecified type   [facility] [name], NP              Passed - Cr in normal range and within 360 days    Creat  Date Value Ref Range Status  12/28/2022 1.23 (H) 0.70 - 1.22 mg/dL Final   Creatinine, Ser  Date Value Ref Range Status  03/10/2023 1.20 0.61 - 1.24 mg/dL Final   Creatinine,U  Date Value Ref Range Status  02/19/2011 48.6 mg/dL Final   Creatinine, Urine  Date Value Ref Range Status  08/13/2016 62 20 - 370 mg/dL Final         Passed - ALT in normal range and within 360 days    ALT  Date Value Ref Range Status  03/09/2023 29 0 - 44 U/L Final

## 2023-05-03 NOTE — Telephone Encounter (Signed)
Pharmacy sent script to follow up on refill of  apixaban (ELIQUIS) 2.5 MG TABS tablet SIG: TAKE 1 TABLET BY MOUTH TWICE DAILY  (office visit needed for additional refills)  **LOV 03/19/2023 (HFU)**  Pharmacist requesting 90 day supply (180)  Pharmacy:  [facility] [address], [place] - [address] [place] [address] Phone: [phone]  Fax: [phone]   Please advise pharmacist.

## 2023-05-06 DIAGNOSIS — Z89512 Acquired absence of left leg below knee: Secondary | ICD-10-CM | POA: Diagnosis not present

## 2023-05-23 ENCOUNTER — Other Ambulatory Visit: Payer: Self-pay | Admitting: Internal Medicine

## 2023-05-24 ENCOUNTER — Other Ambulatory Visit: Payer: Self-pay

## 2023-05-24 ENCOUNTER — Encounter (HOSPITAL_COMMUNITY): Payer: Self-pay

## 2023-05-24 ENCOUNTER — Emergency Department (HOSPITAL_COMMUNITY)
Admission: EM | Admit: 2023-05-24 | Discharge: 2023-05-24 | Disposition: A | Discharge status: Home / Self Care | Payer: PPO

## 2023-05-24 ENCOUNTER — Emergency Department (HOSPITAL_COMMUNITY): Payer: PPO

## 2023-05-24 DIAGNOSIS — R0602 Shortness of breath: Secondary | ICD-10-CM | POA: Insufficient documentation

## 2023-05-24 DIAGNOSIS — Z7901 Long term (current) use of anticoagulants: Secondary | ICD-10-CM | POA: Insufficient documentation

## 2023-05-24 DIAGNOSIS — R519 Headache, unspecified: Secondary | ICD-10-CM | POA: Diagnosis not present

## 2023-05-24 DIAGNOSIS — R42 Dizziness and giddiness: Secondary | ICD-10-CM | POA: Diagnosis not present

## 2023-05-24 DIAGNOSIS — R059 Cough, unspecified: Secondary | ICD-10-CM | POA: Diagnosis not present

## 2023-05-24 DIAGNOSIS — Z20822 Contact with and (suspected) exposure to covid-19: Secondary | ICD-10-CM | POA: Insufficient documentation

## 2023-05-24 DIAGNOSIS — I10 Essential (primary) hypertension: Secondary | ICD-10-CM | POA: Insufficient documentation

## 2023-05-24 DIAGNOSIS — Z79899 Other long term (current) drug therapy: Secondary | ICD-10-CM | POA: Insufficient documentation

## 2023-05-24 DIAGNOSIS — R509 Fever, unspecified: Secondary | ICD-10-CM | POA: Diagnosis not present

## 2023-05-24 DIAGNOSIS — I672 Cerebral atherosclerosis: Secondary | ICD-10-CM | POA: Diagnosis not present

## 2023-05-24 LAB — CBC
HCT: 35.5 % — ABNORMAL LOW (ref 39.0–52.0)
Hemoglobin: 11.6 g/dL — ABNORMAL LOW (ref 13.0–17.0)
MCH: 30.5 pg (ref 26.0–34.0)
MCHC: 32.7 g/dL (ref 30.0–36.0)
MCV: 93.4 fL (ref 80.0–100.0)
Platelets: 167 10*3/uL (ref 150–400)
RBC: 3.8 MIL/uL — ABNORMAL LOW (ref 4.22–5.81)
RDW: 15.9 % — ABNORMAL HIGH (ref 11.5–15.5)
WBC: 10.5 10*3/uL (ref 4.0–10.5)
nRBC: 0 % (ref 0.0–0.2)

## 2023-05-24 LAB — BASIC METABOLIC PANEL WITH GFR
Anion gap: 11 (ref 5–15)
BUN: 21 mg/dL (ref 8–23)
CO2: 25 mmol/L (ref 22–32)
Calcium: 9.3 mg/dL (ref 8.9–10.3)
Chloride: 103 mmol/L (ref 98–111)
Creatinine, Ser: 1.45 mg/dL — ABNORMAL HIGH (ref 0.61–1.24)
GFR, Estimated: 45 mL/min — ABNORMAL LOW
Glucose, Bld: 132 mg/dL — ABNORMAL HIGH (ref 70–99)
Potassium: 3.9 mmol/L (ref 3.5–5.1)
Sodium: 139 mmol/L (ref 135–145)

## 2023-05-24 LAB — RESP PANEL BY RT-PCR (RSV, FLU A&B, COVID)  RVPGX2
Influenza A by PCR: NEGATIVE
Influenza B by PCR: NEGATIVE
Resp Syncytial Virus by PCR: NEGATIVE
SARS Coronavirus 2 by RT PCR: NEGATIVE

## 2023-05-24 LAB — CBG MONITORING, ED: Glucose-Capillary: 133 mg/dL — ABNORMAL HIGH (ref 70–99)

## 2023-05-24 MED ORDER — MECLIZINE HCL 25 MG PO TABS
12.5000 mg | ORAL_TABLET | Freq: Once | ORAL | Status: AC
  Administered 2023-05-24: 12.5 mg via ORAL
  Filled 2023-05-24: qty 1

## 2023-05-24 MED ORDER — MECLIZINE HCL 12.5 MG PO TABS: 12.5000 mg | ORAL_TABLET | Freq: Three times a day (TID) | ORAL | 0 refills | Status: AC | PRN

## 2023-05-24 MED ORDER — ONDANSETRON HCL 4 MG/2ML IJ SOLN
4.0000 mg | Freq: Once | INTRAMUSCULAR | Status: DC
  Filled 2023-05-24: qty 2

## 2023-05-24 MED ORDER — ONDANSETRON HCL 4 MG PO TABS: 4.0000 mg | ORAL_TABLET | Freq: Four times a day (QID) | ORAL | 0 refills | Status: AC

## 2023-05-24 MED ORDER — SODIUM CHLORIDE 0.9 % IV BOLUS
500.0000 mL | Freq: Once | INTRAVENOUS | Status: AC
  Administered 2023-05-24: 500 mL via INTRAVENOUS

## 2023-05-24 NOTE — Evaluation (Signed)
Physical Therapy Evaluation Patient Details Name: Eric [name]. MRN: [id number] DOB: [birth year] Today's Date: 05/24/2023  History of Present Illness  89 y.o. male presents to [facility] on 05/24/2023 with dizziness. CT head negative. PMH includes HTN, HLD, L BKA, CAD, AKI, DMII, PVD, CVA.  Clinical Impression  Vestibular assessment: Pt reports symptom onset this morning when moving to left side to get out of bed, describes symptoms at the room spinning 190 mph. Pt also reports onset of congestion in head along with intermittent tinnitus in L ear on Friday. puruits WFL, saccades WFL. VOR horizontal with reproduction of symptoms and saccadic corrections. Dix-hallpike left with very mild symptoms and no nystagmus. Dix-hallpike R with reproduction of symptoms and upbreating nystagmus. Epley maneuver for R BPPV attemped with improvement in symptoms. PT provides instruction for gaze stabilization and reduced speed of changes in direction to limit symptoms.   Pt was able to ambulate with support of RW for household distances. Pt does own a wheelchair and is encouraged to utilize this to navigate around the home if he does not feel comfortable ambulating or if symptoms become more significant. PT recommends further outpatient vestibular PT services in an effort to restore mobility and reduce symptoms.        If plan is discharge home, recommend the following: A little help with walking and/or transfers;A little help with bathing/dressing/bathroom;Assistance with cooking/housework;Assist for transportation;Help with stairs or ramp for entrance   Can travel by private vehicle        Equipment Recommendations None recommended by PT  Recommendations for Other Services       Functional Status Assessment Patient has had a recent decline in their functional status and demonstrates the ability to make significant improvements in function in a reasonable and predictable amount of time.      Precautions / Restrictions Precautions Precautions: Fall Precaution Comments: vestibular symptoms Restrictions Weight Bearing Restrictions: No      Mobility  Bed Mobility Overal bed mobility: Modified Independent             General bed mobility comments: increased time. Pt reports symptoms with supine to long sitting, also intermittent symptoms with vestibular assessment    Transfers Overall transfer level: Modified independent Equipment used: Rolling walker (2 wheels)                    Ambulation/Gait Ambulation/Gait assistance: Contact guard assist Gait Distance (Feet): 150 Feet Assistive device: Rolling walker (2 wheels) Gait Pattern/deviations: Step-through pattern Gait velocity: reduced Gait velocity interpretation: <1.8 ft/sec, indicate of risk for recurrent falls   General Gait Details: slowed step-through gait, verbal cues for slowed turns and changes in direction. Pt with one instance of acute dizzy exacerbation when quickly turning head to left to look out of door. Otherwise symptoms were minimized with reduced speed when changing direction  Stairs            Wheelchair Mobility     Tilt Bed    Modified Rankin (Stroke Patients Only)       Balance Overall balance assessment: Needs assistance Sitting-balance support: No upper extremity supported, Feet supported Sitting balance-Leahy Scale: Good     Standing balance support: Single extremity supported, Reliant on assistive device for balance Standing balance-Leahy Scale: Poor                               Pertinent Vitals/Pain Pain Assessment Pain Assessment: Faces  Faces Pain Scale: Hurts even more Pain Location: neck Pain Descriptors / Indicators: Aching Pain Intervention(s): Monitored during session    Home Living Family/patient expects to be discharged to:: Private residence Living Arrangements: Spouse/significant other Available Help at Discharge:  Family Type of Home: House Home Access: Ramped entrance;Level entry     Alternate Level Stairs-Number of Steps: stair lift from basement entrance (level entry) - this is how pt usually enters/exits the home Home Layout: Two level Home Equipment: Rolling Walker (2 wheels);Wheelchair - Engineer, technical sales - power;Shower seat;Hand held shower head;Grab bars - tub/shower;Other (comment)      Prior Function Prior Level of Function : Independent/Modified Independent             Mobility Comments: ambulatory with RW and prosthetic, utilizes wheelchair when going to bathroom at night       Extremity/Trunk Assessment   Upper Extremity Assessment Upper Extremity Assessment: Overall WFL for tasks assessed    Lower Extremity Assessment Lower Extremity Assessment: RLE deficits/detail RLE Sensation: history of peripheral neuropathy    Cervical / Trunk Assessment Cervical / Trunk Assessment: Normal  Communication   Communication Communication: Hearing impairment Cueing Techniques: Verbal cues  Cognition Arousal: Alert Behavior During Therapy: WFL for tasks assessed/performed Overall Cognitive Status: Within Functional Limits for tasks assessed                                          General Comments General comments (skin integrity, edema, etc.): VSS on RA. Vestibular assessment: Pt reports symptom onset this morning when moving to left side to get out of bed, describes symptoms at the room spinning 190 mph. Pt also reports onset of congestion in head along with intermittent tinnitus in L ear on Friday. puruits WFL, saccades WFL. VOR horizontal with reproduction of symptoms and saccadic corrections. Dix-hallpike left with very mild symptoms and no nystagmus. Dix-hallpike R with reproduction of symptoms and upbreating nystagmus. Epley maneuver for R BPPV attemped with improvement in symptoms. PT provides instruction for gaze stabilization and reduced speed of changes in  direction to limit symptoms    Exercises     Assessment/Plan    PT Assessment Patient needs continued PT services  PT Problem List Decreased activity tolerance;Decreased balance;Decreased mobility       PT Treatment Interventions DME instruction;Gait training;Stair training;Functional mobility training;Therapeutic activities;Balance training;Neuromuscular re-education;Patient/family education;Therapeutic exercise    PT Goals (Current goals can be found in the Care Plan section)  Acute Rehab PT Goals Patient Stated Goal: to stop dizziness PT Goal Formulation: With patient/family Time For Goal Achievement: 06/07/23 Potential to Achieve Goals: Good Additional Goals Additional Goal #1: Pt will independently verbalize compensatory mechanisms to mitigate potential vestibular symptoms    Frequency Min 1X/week     Co-evaluation               AM-PAC PT "6 Clicks" Mobility  Outcome Measure Help needed turning from your back to your side while in a flat bed without using bedrails?: None Help needed moving from lying on your back to sitting on the side of a flat bed without using bedrails?: None Help needed moving to and from a bed to a chair (including a wheelchair)?: None Help needed standing up from a chair using your arms (e.g., wheelchair or bedside chair)?: None Help needed to walk in hospital room?: A Little Help needed climbing 3-5 steps with a railing? :  A Lot 6 Click Score: 21    End of Session Equipment Utilized During Treatment: Gait belt Activity Tolerance: Patient tolerated treatment well Patient left: in bed;with call bell/phone within reach Nurse Communication: Mobility status PT Visit Diagnosis: Other abnormalities of gait and mobility (R26.89);Dizziness and giddiness (R42);BPPV BPPV - Right/Left : Right    Time: 1610-9604 PT Time Calculation (min) (ACUTE ONLY): 53 min   Charges:   PT Evaluation $PT Eval Low Complexity: 1 Low PT Treatments $Gait  Training: 8-22 mins $Canalith Rep Proc: 8-22 mins PT General Charges $$ ACUTE PT VISIT: 1 Visit         Arlyss Gandy, PT, DPT Acute Rehabilitation Office [phone]   Arlyss Gandy 05/24/2023, 11:21 AM

## 2023-05-24 NOTE — ED Triage Notes (Signed)
Pt BIBEMS from home w/ c/o dizziness which started tonight. Inaddition to nasal congestion w/ headache since Friday. Denies any chest pain, N/V/D.

## 2023-05-24 NOTE — Discharge Instructions (Addendum)
Please take the meclizine as needed for dizziness.  Take the Zofran as needed for nausea.  I have placed a referral to physical therapy so you should receive a call regarding this in the next few days.  You may also try calling your home physical therapy team to see if they could provide vestibular therapy to see if they could come to your home.  If not going to the physical therapist outpatient would be the next best option.  Please return to the emergency department for worsening symptoms.

## 2023-05-24 NOTE — ED Notes (Signed)
Patient transported to CT 

## 2023-05-24 NOTE — ED Notes (Signed)
PT has been called and will let me know as soon as they can get someone down here to complete the consult

## 2023-05-24 NOTE — ED Provider Notes (Signed)
[place] EMERGENCY DEPARTMENT AT [facility] Provider Note   CSN: [id number] Arrival date & time: 05/24/23  4098     History  Chief Complaint  Patient presents with   Dizziness    Eric [name]. is a 89 y.o. male.  89 year old male with past medical history of hypertension and hyperlipidemia presents emergency department today with dizziness.  The patient states that he was feeling very unstable at around 4:30 or 5 when he woke up this morning.  He states that he did have a mild headache when he woke up and does feel congested.  He states he has been felt congested now for the past few days.  He went to bed at 8:30 PM and felt fine when he went to bed.  He states that he woke up around midnight and felt fine walking to the bathroom at that point.  He denies any focal weakness, numbness, or tingling.  He states the headache has since improved without any intervention.  He is still feeling very dizzy.  He reports that this occurs when he tries to get up and walk but states that when he sits still that he feels okay.  He does not normally get headaches.   Dizziness      Home Medications Prior to Admission medications   Medication Sig Start Date End Date Taking? Authorizing Provider  meclizine (ANTIVERT) 12.5 MG tablet Take 1 tablet (12.5 mg total) by mouth 3 (three) times daily as needed for dizziness. 05/24/23  Yes Durwin Glaze, MD  ondansetron (ZOFRAN) 4 MG tablet Take 1 tablet (4 mg total) by mouth every 6 (six) hours. 05/24/23  Yes Durwin Glaze, MD  acetaminophen (TYLENOL) 325 MG tablet Take 2 tablets (650 mg total) by mouth every 6 (six) hours as needed for mild pain (or temp >/= 101 F). 12/21/17   Alford Highland, MD  amLODipine (NORVASC) 10 MG tablet Take 1 tablet (10 mg total) by mouth daily. 11/30/22   [name], MD  apixaban (ELIQUIS) 2.5 MG TABS tablet TAKE 1 TABLET BY MOUTH TWICE DAILY (OFFICE  VISIT  NEEDED  FOR  ADDITIONAL  REFILLS) 05/13/23    [name], MD  atenolol (TENORMIN) 25 MG tablet Take 1/2 tablet (12.5 mg total) by mouth daily. 02/17/23   [name], MD  bethanechol (URECHOLINE) 10 MG tablet TAKE 1/2 (ONE-HALF) TABLET BY MOUTH THREE TIMES DAILY 04/07/23   [name], MD  Cinnamon 500 MG TABS Take 500 mg by mouth daily.    [provider]  Cranberry 500 MG CAPS Take 500 mg by mouth daily.    [provider]  cyanocobalamin (VITAMIN B12) 500 MCG tablet Take 500 mcg by mouth daily.    [provider]  ferrous sulfate 325 (65 FE) MG EC tablet Take 325 mg by mouth at bedtime.    [provider]  finasteride (PROSCAR) 5 MG tablet TAKE 1 TABLET BY MOUTH ONCE DAILY 03/28/18   [name], MD  fluticasone [facility]) 50 MCG/ACT nasal spray Place 1 spray into both nostrils daily. 12/17/22   Angiulli, Mcarthur Rossetti, PA-C  furosemide (LASIX) 40 MG tablet Take 40 mg by mouth daily.    [provider]  hydrALAZINE (APRESOLINE) 25 MG tablet TAKE 3 TABLETS BY MOUTH IN THE MORNING AND AT BEDTIME 02/15/23   Pricilla Riffle, MD  Multiple Vitamin (DAILY MULTIVITAMIN PO) Take 1 tablet by mouth daily.    [provider]  pentoxifylline (TRENTAL) 400 MG CR tablet TAKE 1 TABLET BY MOUTH EVERY 12 HOURS . 11/30/22   [name], MD  rosuvastatin (CRESTOR) 40 MG tablet Take 1 tablet (40 mg total) by mouth daily. 11/30/22   [name], MD  tamsulosin (FLOMAX) 0.4 MG CAPS capsule Take 2 capsules (0.8 mg total) by mouth daily after supper. 02/17/23   [name], MD      Allergies    Altace [ramipril], Isosorbide mononitrate, and Quinidine gluconate    Review of Systems   Review of Systems  Neurological:  Positive for dizziness.  All other systems reviewed and are negative.   Physical Exam Updated Vital Signs BP 138/84   Pulse 61   Temp 98.1 F (36.7 C)   Resp 19   Ht 5\' 5"  (1.651 m)   Wt 69.5 kg   SpO2 100%   BMI 25.50 kg/m  Physical Exam Vitals and  nursing note reviewed.   Gen: NAD Eyes: PERRL, EOMI HEENT: no oropharyngeal swelling Neck: trachea midline Resp: clear to auscultation bilaterally Card: RRR, no murmurs, rubs, or gallops Abd: nontender, nondistended Extremities: no calf tenderness, no edema Vascular: 2+ radial pulses bilaterally, 2+ DP pulses bilaterally Neuro: NIH stroke scale of zero, no vertical nystagmus, L horizontal nystagmus Skin: no rashes Psyc: acting appropriately   ED Results / Procedures / Treatments   Labs (all labs ordered are listed, but only abnormal results are displayed) Labs Reviewed  BASIC METABOLIC PANEL - Abnormal; Notable for the following components:      Result Value   Glucose, Bld 132 (*)    Creatinine, Ser 1.45 (*)    GFR, Estimated 45 (*)    All other components within normal limits  CBC - Abnormal; Notable for the following components:   RBC 3.80 (*)    Hemoglobin 11.6 (*)    HCT 35.5 (*)    RDW 15.9 (*)    All other components within normal limits  CBG MONITORING, ED - Abnormal; Notable for the following components:   Glucose-Capillary 133 (*)    All other components within normal limits  RESP PANEL BY RT-PCR (RSV, FLU A&B, COVID)  [id number]    EKG None  Radiology CT Head Wo Contrast  Result Date: 05/24/2023 CLINICAL DATA:  89 year old male with headache. History of right vertebral artery occlusion, severe atherosclerotic stenoses. EXAM: CT HEAD WITHOUT CONTRAST TECHNIQUE: Contiguous axial images were obtained from the base of the skull through the vertex without intravenous contrast. RADIATION DOSE REDUCTION: This exam was performed according to the departmental dose-optimization program which includes automated exposure control, adjustment of the mA and/or kV according to patient size and/or use of iterative reconstruction technique. COMPARISON:  Brain MRI and Head CT 03/08/2023. FINDINGS: Brain: Stable cerebral volume. No midline shift, ventriculomegaly, mass effect, evidence  of mass lesion, intracranial hemorrhage or evidence of cortically based acute infarction. Stable and normal for age gray-white differentiation, minimal to mild white matter hypodensity. No cortical encephalomalacia identified. Vascular: Severe Calcified atherosclerosis at the skull base. No suspicious intracranial vascular hyperdensity. Skull: No acute osseous abnormality identified. Sinuses/Orbits: Stable paranasal sinuses, well aerated except for moderate mucosal thickening and polypoid opacity in the right sphenoid. Some superimposed periosteal thickening there. Tympanic cavities and mastoids are clear. Other: Extensive calcified scalp vessel atherosclerosis. Visualized orbits and scalp soft tissues otherwise within normal limits. IMPRESSION: 1. No acute intracranial abnormality. Largely negative for age noncontrast CT appearance of the brain parenchyma. 2. Chronic severe calcified atherosclerosis. 3. Chronic  right sphenoid sinus disease. Electronically Signed   By: Odessa Fleming M.D.   On: 05/24/2023 09:30    Procedures Procedures    Medications Ordered in ED Medications  ondansetron [facility] injection 4 mg (0 mg Intravenous Hold 05/24/23 0957)  meclizine (ANTIVERT) tablet 12.5 mg (12.5 mg Oral Given 05/24/23 0840)  sodium chloride 0.9 % bolus 500 mL (0 mLs Intravenous Stopped 05/24/23 1024)    ED Course/ Medical Decision Making/ A&P                                 Medical Decision Making 89 year old male presents the emergency department today with dizziness.  Further evaluate the patient here with basic labs to evaluate for electrolyte abnormalities or anemia.  Will obtain a CT scan of his head eval for intracranial hemorrhage or mass lesion.  I will give patient meclizine here in the event this is due to peripheral vertigo.  Will reevaluate for ultimate disposition.  The patient's NIH stroke scale is 0.  He does not have any focal neurological deficits here on exam.  His last known normal was  midnight last night so he is clearly outside the window for thrombolytics at this time.  He does not have findings on exam consistent with large vessel occlusion.  Will hold off on a stroke code at this time.  The patient's workup is reassuring.  He was evaluated by physical therapy here.  They did some vestibular therapy and the patient is feeling much better.  I do think this is consistent with peripheral vertigo.  I did offer the patient admission for further observation and even SNF placement for intensive treatment but he declines and would rather follow-up.  He is referred to vestibular therapy as an outpatient.  He is encouraged to return to the ER for worsening symptoms.  He is ultimately discharged through shared decision making.  Amount and/or Complexity of Data Reviewed Labs: ordered. Radiology: ordered.  Risk Prescription drug management.           Final Clinical Impression(s) / ED Diagnoses Final diagnoses:  Dizziness    Rx / DC Orders ED Discharge Orders          Ordered    Ambulatory referral to Physical Therapy        05/24/23 1128    meclizine (ANTIVERT) 12.5 MG tablet  3 times daily PRN        05/24/23 1128    Ambulatory referral to Physical Therapy       Comments: Eval and treat for vestibular therapy   05/24/23 1130    ondansetron (ZOFRAN) 4 MG tablet  Every 6 hours        05/24/23 1130              Durwin Glaze, MD 05/24/23 1656

## 2023-05-31 ENCOUNTER — Other Ambulatory Visit: Payer: Self-pay | Admitting: Internal Medicine

## 2023-06-01 DIAGNOSIS — H353221 Exudative age-related macular degeneration, left eye, with active choroidal neovascularization: Secondary | ICD-10-CM | POA: Diagnosis not present

## 2023-06-07 ENCOUNTER — Other Ambulatory Visit: Payer: Self-pay | Admitting: Family Medicine

## 2023-06-07 DIAGNOSIS — I10 Essential (primary) hypertension: Secondary | ICD-10-CM

## 2023-06-07 DIAGNOSIS — E781 Pure hyperglyceridemia: Secondary | ICD-10-CM

## 2023-06-08 ENCOUNTER — Ambulatory Visit: Payer: PPO | Admitting: Physical Therapy

## 2023-06-08 DIAGNOSIS — H8113 Benign paroxysmal vertigo, bilateral: Secondary | ICD-10-CM | POA: Diagnosis not present

## 2023-06-08 NOTE — Therapy (Unsigned)
OUTPATIENT PHYSICAL THERAPY VESTIBULAR EVALUATION     Patient Name: Eric [name]. MRN: [id number] DOB:[birth year], 89 y.o., male Today's Date: 06/09/2023  END OF SESSION:  PT End of Session - 06/09/23 1740     Visit Number 1    Number of Visits 1    Authorization Type HTA Medicare    PT Start Time 1405    PT Stop Time 1446    PT Time Calculation (min) 41 min    Activity Tolerance Patient tolerated treatment well    Behavior During Therapy WFL for tasks assessed/performed             Past Medical History:  Diagnosis Date   Atrial flutter (HCC)    s/p RFCA   CAD (coronary artery disease)    cath 2003, occluded S-RCA, occluded S-Dx, L-LAD ok, s/p PTCA to LAD   Cataract    CKD (chronic kidney disease) stage 3, GFR 30-59 ml/min (HCC)    Diabetes mellitus    diet controlled   Hearing aid worn    bilateral   History of kidney stones    Long term (current) use of anticoagulants    Neuromuscular disorder (HCC)    OSA (obstructive sleep apnea) 12/11   very mild, AHI 7/hr   Persistent atrial fibrillation (HCC) 09/26/2015   Pure hyperglyceridemia    PVD (peripheral vascular disease) (HCC)    angioplasty of his right lower extremity in [place] by Dr.[name] 2013   Unspecified essential hypertension    Wears dentures    full upper and lower   Past Surgical History:  Procedure Laterality Date   ABDOMINAL AORTOGRAM W/LOWER EXTREMITY N/A 11/15/2017   Procedure: ABDOMINAL AORTOGRAM W/LOWER EXTREMITY;  Surgeon: Sherren Kerns, MD;  Location: [facility] INVASIVE CV [facility];  Service: Cardiovascular;  Laterality: N/A;   Adenosine Myoview  3/06   EF 56%, neg. Ischemia   Adenosine Myoview  02/18/07   nml   AMPUTATION Left 12/15/2017   Procedure: AMPUTATION BELOW KNEE;  Surgeon: Annice Needy, MD;  Location: [facility] ORS;  Service: Vascular;  Laterality: Left;   ANGIOPLASTY  1/99   CAD- diogonal with rotational artherectomy   Arthrectomy     of LAD & PTCA   BLEPHAROPLASTY Bilateral     CARDIAC CATHETERIZATION  1/00   CARDIOVERSION  1/04   CARDIOVERSION  5/07   hospital- a flutter   CARPAL TUNNEL RELEASE     ? bilateral   CATARACT EXTRACTION W/PHACO Right 07/28/2017   Procedure: CATARACT EXTRACTION PHACO AND INTRAOCULAR LENS PLACEMENT (IOC) RIGHT DIABETIC;  Surgeon: Lockie Mola, MD;  Location: [facility];  Service: Ophthalmology;  Laterality: Right;  Diabetic - diet controlled   CATARACT EXTRACTION W/PHACO Left 08/18/2017   Procedure: CATARACT EXTRACTION PHACO AND INTRAOCULAR LENS PLACEMENT (IOC);  Surgeon: Lockie Mola, MD;  Location: [facility];  Service: Ophthalmology;  Laterality: Left;  DIABETES - oral meds   COLONOSCOPY W/ BIOPSIES  10/01/06   sigmoid polyp bx neg, 3 years   COLONOSCOPY WITH PROPOFOL N/A 04/28/2021   Procedure: COLONOSCOPY WITH PROPOFOL;  Surgeon: Iva Boop, MD;  Location: [facility] ENDOSCOPY;  Service: Endoscopy;  Laterality: N/A;   CORONARY ANGIOPLASTY  4/03   cutting balloon PTCA pLAD into Diag   CORONARY ARTERY BYPASS GRAFT  2000   LIMA-LAD, SVG-RCA, SVG-Diag; SVG-Diag & SVG-RCA occluded 2003   FRACTURE SURGERY     HAND SURGERY  08/27/09   R thumb procedure wit Scaphoid Gragt and screws, Dr Merlyn Lot  LOWER EXTREMITY ANGIOGRAPHY Right 01/25/2019   Procedure: LOWER EXTREMITY ANGIOGRAPHY;  Surgeon: Renford Dills, MD;  Location: [facility] INVASIVE CV LAB;  Service: Cardiovascular;  Laterality: Right;   NM MYOVIEW LTD  4/11   normal   PERIPHERAL VASCULAR CATHETERIZATION Right 06/27/2015   Procedure: Lower Extremity Angiography;  Surgeon: Annice Needy, MD;  Location: [facility] INVASIVE CV LAB;  Service: Cardiovascular;  Laterality: Right;   PERIPHERAL VASCULAR CATHETERIZATION  06/27/2015   Procedure: Lower Extremity Intervention;  Surgeon: Annice Needy, MD;  Location: [facility] INVASIVE CV LAB;  Service: Cardiovascular;;   POLYPECTOMY  04/28/2021   Procedure: POLYPECTOMY;  Surgeon: Iva Boop, MD;  Location: [facility] ENDOSCOPY;   Service: Endoscopy;;   Patient Active Problem List   Diagnosis Date Noted   CVA (cerebral vascular accident) (HCC) 03/08/2023   Thrombocytopenia (HCC) 03/08/2023   Abnormal gait 02/12/2023   Coping style affecting medical condition 12/11/2022   Traumatic closed trochanteric fracture of femur with minimal displacement with routine healing, left 12/05/2022   Greater trochanter fracture (HCC) 12/04/2022   Closed left femoral fracture (HCC) 11/30/2022   Pain due to onychomycosis of toenails of both feet 04/23/2022   Hammer toes, bilateral 04/23/2022   Porokeratosis 04/23/2022   Diverticulosis of colon with hemorrhage    Erosive gastritis    Benign neoplasm of transverse colon    Benign neoplasm of descending colon    Acute GI bleeding 04/27/2021   Acute blood loss anemia 04/27/2021   Acute kidney injury superimposed on CKD (HCC) 04/27/2021   Hematuria 07/24/2020   Hypertensive urgency 03/08/2019   Hx of BKA, left (HCC) 01/13/2018   Nausea and vomiting in adult 12/26/2017   Pressure injury of skin 12/23/2017   C. difficile colitis 12/22/2017   UTI (urinary tract infection) 12/22/2017   Hyponatremia 12/22/2017   Normocytic anemia 12/22/2017   ASO (arteriosclerosis obliterans) 11/23/2017   Chronic anticoagulation 09/26/2015   History of anticoagulant therapy 09/26/2015   PVD (peripheral vascular disease) (HCC) 06/02/2012   Type 2 diabetes mellitus (HCC) 05/17/2012   Benign prostatic hyperplasia 11/16/2011   AKI (acute kidney injury) (HCC) 08/26/2011   Chronic kidney disease (CKD), stage III (moderate) (HCC) 08/26/2011   Chronic coronary artery disease 03/25/2011   Obstructive sleep apnea 09/15/2010   Other specified extrapyramidal and movement disorders 09/15/2010   History of colonic polyps 10/25/2009   Vitamin B-complex deficiency 03/04/2009   ATRIAL FIBRILLATION 12/25/2008   Atrial flutter (HCC) 12/25/2008   Hyperlipidemia 11/24/2006   Essential hypertension 11/24/2006     PCP: Lynnea Ferrier, MD REFERRING PROVIDER: Durwin Glaze, MD  REFERRING DIAG: Vertigo  THERAPY DIAG:  BPPV (benign paroxysmal positional vertigo), bilateral  ONSET DATE: 05-24-23  Rationale for Evaluation and Treatment: Rehabilitation  SUBJECTIVE:   SUBJECTIVE STATEMENT: Pt presents to PT eval accompanied by his son - pt reports he has not had any vertigo since treatment was received during hospitalization (no dizziness in about 2 weeks) Pt accompanied by: family member - son  PERTINENT HISTORY: h/o Lt BKA, h/o CVA July 2024  PAIN:  Are you having pain? No  PRECAUTIONS: None  RED FLAGS: None   WEIGHT BEARING RESTRICTIONS: No  FALLS: Has patient fallen in last 6 months? No  LIVING ENVIRONMENT: Lives with: lives with their spouse Lives in: House/apartment  PLOF: Independent with basic ADLs, Independent with household mobility with device, Independent with community mobility with device, and Independent with transfers  PATIENT GOALS: "make sure the vertigo is gone"  OBJECTIVE:  Note: Objective measures were completed at Evaluation unless otherwise noted.  DIAGNOSTIC FINDINGS: IMPRESSION: 1. No acute intracranial abnormality. Largely negative for age noncontrast CT appearance of the brain parenchyma. 2. Chronic severe calcified atherosclerosis. 3. Chronic right sphenoid sinus disease.  COGNITION: Overall cognitive status: Within functional limits for tasks assessed  Cervical ROM:  WFL's   TRANSFERS: Assistive device utilized: Environmental consultant - 2 wheeled  Sit to stand: Modified independence Stand to sit: Modified independence  GAIT: Gait pattern: step through pattern Distance walked: 50' Assistive device utilized: Environmental consultant - 2 wheeled Level of assistance: SBA Comments: pt used RW prior to vertiginous episode on 05-24-23  VESTIBULAR ASSESSMENT:  GENERAL OBSERVATION: pt is a 89 yr old gentleman amb. With RW with no c/o vertigo at this time   SYMPTOM  BEHAVIOR:  Subjective history: pt was in ED on 05-24-23 for 5 hours due to vertigo; pt was treated by PT with Epley maneuver and reports no vertigo at this time  Non-Vestibular symptoms:  N/A  Type of dizziness: Spinning/Vertigo  Frequency: None at current time  Duration: None  Aggravating factors:  none at this time  Relieving factors: head stationary  Progression of symptoms: better  OCULOMOTOR EXAM:  Ocular Alignment: normal  Ocular ROM: No Limitations  Spontaneous Nystagmus: absent  Gaze-Induced Nystagmus: absent  Smooth Pursuits: intact  Saccades: intact POSITIONAL TESTING: Right Dix-Hallpike: no nystagmus Left Dix-Hallpike: no nystagmus  MOTION SENSITIVITY:  Motion Sensitivity Quotient Intensity: 0 = none, 1 = Lightheaded, 2 = Mild, 3 = Moderate, 4 = Severe, 5 = Vomiting  Intensity  1. Sitting to supine   2. Supine to L side   3. Supine to R side   4. Supine to sitting   5. L Hallpike-Dix 0  6. Up from L  1  7. R Hallpike-Dix 0  8. Up from R  1  9. Sitting, head tipped to L knee   10. Head up from L knee   11. Sitting, head tipped to R knee   12. Head up from R knee   13. Sitting head turns x5   14.Sitting head nods x5   15. In stance, 180 turn to L    16. In stance, 180 turn to R    VESTIBULAR TREATMENT:                                                                                                   DATE: 06-08-23  N/A - BPPV has resolved as of present time  PATIENT EDUCATION: Education details: eval results  Person educated: Patient and Child(ren) Education method: Explanation Education comprehension: verbalized understanding  HOME EXERCISE PROGRAM:  N/A  GOALS:  N/A - eval only due to BPPV resolved   ASSESSMENT:  CLINICAL IMPRESSION: Patient is a 89 y.o. gentleman who was seen today for physical therapy evaluation and treatment for c/o vertigo with symptoms consistent with BPPV.  Pt reports he received treatment (Epley maneuver) in ED on 05-24-23  and has not had any vertigo since that time.  Pt reports he thought about cancelling today's PT eval but wanted  to be sure that vertigo was fully resolved. No treatment indicated due to resolution of BPPV.   OBJECTIVE IMPAIRMENTS:  N/A .   ACTIVITY LIMITATIONS:  N/A  PARTICIPATION LIMITATIONS:  N/A  PERSONAL FACTORS:  N/A  are also affecting patient's functional outcome.   REHAB POTENTIAL: Good  CLINICAL DECISION MAKING: Stable/uncomplicated  EVALUATION COMPLEXITY: Low   PLAN:  PT FREQUENCY: one time visit  PT DURATION: 1 week  PLANNED INTERVENTIONS:  Initial eval as no vertigo reported at this time  PLAN FOR NEXT SESSION: N/A - eval only   Desarea Ohagan, Donavan Burnet, PT 06/09/2023, 5:42 PM

## 2023-06-08 NOTE — Telephone Encounter (Signed)
Requested Prescriptions  Pending Prescriptions Disp Refills   rosuvastatin (CRESTOR) 40 MG tablet [Pharmacy Med Name: Rosuvastatin Calcium 40 MG Oral Tablet] 90 tablet 1    Sig: Take 1 tablet by mouth once daily     Cardiovascular:  Antilipid - Statins 2 Failed - 06/07/2023 12:08 PM      Failed - Cr in normal range and within 360 days    Creat  Date Value Ref Range Status  12/28/2022 1.23 (H) 0.70 - 1.22 mg/dL Final   Creatinine, Ser  Date Value Ref Range Status  05/24/2023 1.45 (H) 0.61 - 1.24 mg/dL Final   Creatinine,U  Date Value Ref Range Status  02/19/2011 48.6 mg/dL Final   Creatinine, Urine  Date Value Ref Range Status  08/13/2016 62 20 - 370 mg/dL Final         Failed - Valid encounter within last 12 months    Recent Outpatient Visits           1 year ago Wheezing   [facility] [name], NP   2 years ago Immunization due   [facility] Pickard, Priscille Heidelberg, MD   2 years ago Diverticulosis   [facility] [name], MD   2 years ago Type 2 diabetes mellitus with diabetic peripheral angiopathy with gangrene, unspecified whether long term insulin use (HCC)   [facility] [name], MD   2 years ago Hematuria, unspecified type   [facility] Cathlean Marseilles A, NP              Failed - Lipid Panel in normal range within the last 12 months    Cholesterol, Total  Date Value Ref Range Status  12/09/2020 83 (L) 100 - 199 mg/dL Final   Cholesterol  Date Value Ref Range Status  03/08/2023 88 0 - 200 mg/dL Final   LDL Cholesterol (Calc)  Date Value Ref Range Status  11/16/2022 44 mg/dL (calc) Final    Comment:    Reference range: <100 . Desirable range <100 mg/dL for primary prevention;   <70 mg/dL for patients with CHD or diabetic patients  with > or = 2 CHD risk factors. [name] LDL-C is now calculated using the Martin-Hopkins  calculation, which is a  validated novel method providing  better accuracy than the Friedewald equation in the  estimation of LDL-C.  Horald Pollen et al. Lenox Ahr. 4782;956(21): 2061-2068  (http://education.[facility].com/faq/FAQ164)    LDL Cholesterol  Date Value Ref Range Status  03/08/2023 39 0 - 99 mg/dL Final    Comment:           Total Cholesterol/HDL:CHD Risk Coronary Heart Disease Risk Table                     Men   Women  1/2 Average Risk   3.4   3.3  Average Risk       5.0   4.4  2 X Average Risk   9.6   7.1  3 X Average Risk  23.4   11.0        Use the calculated Patient Ratio above and the CHD Risk Table to determine the patient's CHD Risk.        ATP III CLASSIFICATION (LDL):  <100     mg/dL   Optimal  308-657  mg/dL   Near or Above  Optimal  130-159  mg/dL   Borderline  295-621  mg/dL   High  >308     mg/dL   Very High Performed at [facility], [address], [place], [place] [address]    Direct LDL  Date Value Ref Range Status  02/19/2011 99.1 mg/dL Final    Comment:    Optimal:  <100 mg/dLNear or Above Optimal:  100-129 mg/dLBorderline High:  130-159 mg/dLHigh:  160-189 mg/dLVery High:  >190 mg/dL   HDL  Date Value Ref Range Status  03/08/2023 33 (L) >40 mg/dL Final  69/62/9528 30 (L) >39 mg/dL Final   Triglycerides  Date Value Ref Range Status  03/08/2023 80 <150 mg/dL Final         Passed - Patient is not pregnant       amLODipine (NORVASC) 10 MG tablet [Pharmacy Med Name: amLODIPine Besylate 10 MG Oral Tablet] 90 tablet 1    Sig: Take 1 tablet by mouth once daily     Cardiovascular: Calcium Channel Blockers 2 Failed - 06/07/2023 12:08 PM      Failed - Valid encounter within last 6 months    Recent Outpatient Visits           1 year ago Wheezing   [facility] [name], NP   2 years ago Immunization due   [facility] Pickard, Priscille Heidelberg, MD   2 years ago Diverticulosis   [facility] [name], MD   2 years ago Type 2 diabetes mellitus with diabetic peripheral angiopathy with gangrene, unspecified whether long term insulin use (HCC)   [facility] Pickard, Priscille Heidelberg, MD   2 years ago Hematuria, unspecified type   [facility] Cathlean Marseilles A, NP              Passed - Last BP in normal range    BP Readings from Last 1 Encounters:  05/24/23 138/84         Passed - Last Heart Rate in normal range    Pulse Readings from Last 1 Encounters:  05/24/23 61

## 2023-06-09 ENCOUNTER — Encounter: Payer: Self-pay | Admitting: Physical Therapy

## 2023-06-14 ENCOUNTER — Other Ambulatory Visit: Payer: Self-pay | Admitting: Internal Medicine

## 2023-06-14 ENCOUNTER — Other Ambulatory Visit: Payer: Self-pay | Admitting: Family Medicine

## 2023-06-14 DIAGNOSIS — R339 Retention of urine, unspecified: Secondary | ICD-10-CM

## 2023-06-14 DIAGNOSIS — I739 Peripheral vascular disease, unspecified: Secondary | ICD-10-CM

## 2023-06-14 DIAGNOSIS — I10 Essential (primary) hypertension: Secondary | ICD-10-CM

## 2023-06-15 NOTE — Telephone Encounter (Signed)
Requested Prescriptions  Pending Prescriptions Disp Refills   tamsulosin (FLOMAX) 0.4 MG CAPS capsule [Pharmacy Med Name: Tamsulosin HCl 0.4 MG Oral Capsule] 60 capsule 0    Sig: TAKE 2 CAPSULES BY MOUTH ONCE DAILY AFTER SUPPER     Urology: Alpha-Adrenergic Blocker Failed - 06/14/2023 12:29 PM      Failed - PSA in normal range and within 360 days    PSA  Date Value Ref Range Status  12/16/2015 0.70 <=4.00 ng/mL Final    Comment:    Test Methodology: ECLIA PSA (Electrochemiluminescence Immunoassay)   For PSA values from 2.5-4.0, particularly in younger men <8 years old, the AUA and NCCN suggest testing for % Free PSA (3515) and evaluation of the rate of increase in PSA (PSA velocity).          Failed - Valid encounter within last 12 months    Recent Outpatient Visits           1 year ago Wheezing   [facility] [name], NP   2 years ago Immunization due   [facility] Pickard, Priscille Heidelberg, MD   2 years ago Diverticulosis   [facility] [name], MD   2 years ago Type 2 diabetes mellitus with diabetic peripheral angiopathy with gangrene, unspecified whether long term insulin use (HCC)   [facility] [name], MD   2 years ago Hematuria, unspecified type   [facility] [name], NP              Passed - Last BP in normal range    BP Readings from Last 1 Encounters:  05/24/23 138/84          atenolol (TENORMIN) 25 MG tablet [Pharmacy Med Name: Atenolol 25 MG Oral Tablet] 15 tablet 0    Sig: Take 1/2 (one-half) tablet by mouth once daily     Cardiovascular: Beta Blockers 2 Failed - 06/14/2023 12:29 PM      Failed - Cr in normal range and within 360 days    Creat  Date Value Ref Range Status  12/28/2022 1.23 (H) 0.70 - 1.22 mg/dL Final   Creatinine, Ser  Date Value Ref Range Status  05/24/2023 1.45 (H) 0.61 - 1.24 mg/dL Final   Creatinine,U  Date  Value Ref Range Status  02/19/2011 48.6 mg/dL Final   Creatinine, Urine  Date Value Ref Range Status  08/13/2016 62 20 - 370 mg/dL Final         Failed - Valid encounter within last 6 months    Recent Outpatient Visits           1 year ago Wheezing   [facility] [name], NP   2 years ago Immunization due   [facility] Pickard, Priscille Heidelberg, MD   2 years ago Diverticulosis   [facility] [name], MD   2 years ago Type 2 diabetes mellitus with diabetic peripheral angiopathy with gangrene, unspecified whether long term insulin use (HCC)   [facility] Pickard, Priscille Heidelberg, MD   2 years ago Hematuria, unspecified type   [facility] Cathlean Marseilles A, NP              Passed - Last BP in normal range    BP Readings from Last 1 Encounters:  05/24/23 138/84         Passed -  Last Heart Rate in normal range    Pulse Readings from Last 1 Encounters:  05/24/23 61          pentoxifylline (TRENTAL) 400 MG CR tablet [Pharmacy Med Name: Pentoxifylline ER 400 MG Oral Tablet Extended Release] 180 tablet 0    Sig: TAKE 1 TABLET BY MOUTH EVERY 12 HOURS     Hematology:  Antiplatelets - pentoxifylline Failed - 06/14/2023 12:29 PM      Failed - Cr in normal range and within 360 days    Creat  Date Value Ref Range Status  12/28/2022 1.23 (H) 0.70 - 1.22 mg/dL Final   Creatinine, Ser  Date Value Ref Range Status  05/24/2023 1.45 (H) 0.61 - 1.24 mg/dL Final   Creatinine,U  Date Value Ref Range Status  02/19/2011 48.6 mg/dL Final   Creatinine, Urine  Date Value Ref Range Status  08/13/2016 62 20 - 370 mg/dL Final         Failed - HGB in normal range and within 180 days    Hemoglobin  Date Value Ref Range Status  05/24/2023 11.6 (L) 13.0 - 17.0 g/dL Final  16/05/9603 54.0 (L) 13.0 - 17.7 g/dL Final         Failed - HCT in normal range and within 180 days    HCT  Date Value Ref  Range Status  05/24/2023 35.5 (L) 39.0 - 52.0 % Final   Hematocrit  Date Value Ref Range Status  09/28/2022 35.2 (L) 37.5 - 51.0 % Final         Failed - Valid encounter within last 6 months    Recent Outpatient Visits           1 year ago Wheezing   [facility] [name], NP   2 years ago Immunization due   [facility] Pickard, Priscille Heidelberg, MD   2 years ago Diverticulosis   [facility] [name], MD   2 years ago Type 2 diabetes mellitus with diabetic peripheral angiopathy with gangrene, unspecified whether long term insulin use (HCC)   [facility] [name], MD   2 years ago Hematuria, unspecified type   [facility] Cathlean Marseilles A, NP              Passed - PLT in normal range and within 180 days    Platelets  Date Value Ref Range Status  05/24/2023 167 150 - 400 K/uL Final  09/28/2022 216 150 - 450 x10E3/uL Final

## 2023-06-17 ENCOUNTER — Other Ambulatory Visit: Payer: Self-pay | Admitting: Family Medicine

## 2023-06-17 DIAGNOSIS — R339 Retention of urine, unspecified: Secondary | ICD-10-CM

## 2023-06-17 NOTE — Telephone Encounter (Signed)
Requested Prescriptions  Pending Prescriptions Disp Refills   bethanechol (URECHOLINE) 10 MG tablet [Pharmacy Med Name: Bethanechol Chloride 10 MG Oral Tablet] 135 tablet 0    Sig: TAKE 1/2 (ONE-HALF) TABLET BY MOUTH THREE TIMES DAILY     Urology:  Bladder Agents Failed - 06/17/2023  9:20 AM      Failed - Valid encounter within last 12 months    Recent Outpatient Visits           1 year ago Wheezing   [facility] [name], NP   2 years ago Immunization due   [facility] Pickard, Priscille Heidelberg, MD   2 years ago Diverticulosis   [facility] [name], MD   2 years ago Type 2 diabetes mellitus with diabetic peripheral angiopathy with gangrene, unspecified whether long term insulin use (HCC)   [facility] Pickard, Priscille Heidelberg, MD   2 years ago Hematuria, unspecified type   [facility] [name], NP

## 2023-06-21 ENCOUNTER — Other Ambulatory Visit: Payer: Self-pay | Admitting: Family Medicine

## 2023-06-21 ENCOUNTER — Other Ambulatory Visit: Payer: Self-pay | Admitting: Internal Medicine

## 2023-06-21 DIAGNOSIS — R339 Retention of urine, unspecified: Secondary | ICD-10-CM

## 2023-06-22 NOTE — Telephone Encounter (Signed)
Refused urecholine because this is a duplicate request.

## 2023-07-06 DIAGNOSIS — H35712 Central serous chorioretinopathy, left eye: Secondary | ICD-10-CM | POA: Diagnosis not present

## 2023-07-06 DIAGNOSIS — H353221 Exudative age-related macular degeneration, left eye, with active choroidal neovascularization: Secondary | ICD-10-CM | POA: Diagnosis not present

## 2023-07-12 ENCOUNTER — Other Ambulatory Visit: Payer: Self-pay | Admitting: Family Medicine

## 2023-07-12 DIAGNOSIS — R339 Retention of urine, unspecified: Secondary | ICD-10-CM

## 2023-07-12 DIAGNOSIS — I10 Essential (primary) hypertension: Secondary | ICD-10-CM

## 2023-07-15 ENCOUNTER — Encounter: Payer: Self-pay | Admitting: *Deleted

## 2023-07-15 NOTE — Telephone Encounter (Signed)
Requested medication (s) are due for refill today:   Yes for both  Requested medication (s) are on the active medication list:   Yes for both  Future visit scheduled:   No  LOV 03/19/2023   Message sent to call in and make an appointment.    Last ordered: Atenolol 06/15/2023 #15, 0 refills;   Flomax 06/15/2023 #60, 0 refills  Unable to refill because labs and an OV are due.      Requested Prescriptions  Pending Prescriptions Disp Refills   atenolol (TENORMIN) 25 MG tablet [Pharmacy Med Name: Atenolol 25 MG Oral Tablet] 15 tablet 0    Sig: Take 1/2 (one-half) tablet by mouth once daily     Cardiovascular: Beta Blockers 2 Failed - 07/12/2023 11:37 AM      Failed - Cr in normal range and within 360 days    Creat  Date Value Ref Range Status  12/28/2022 1.23 (H) 0.70 - 1.22 mg/dL Final   Creatinine, Ser  Date Value Ref Range Status  05/24/2023 1.45 (H) 0.61 - 1.24 mg/dL Final   Creatinine,U  Date Value Ref Range Status  02/19/2011 48.6 mg/dL Final   Creatinine, Urine  Date Value Ref Range Status  08/13/2016 62 20 - 370 mg/dL Final         Failed - Valid encounter within last 6 months    Recent Outpatient Visits           1 year ago Wheezing   [facility] [name], NP   2 years ago Immunization due   [facility] Pickard, Priscille Heidelberg, MD   2 years ago Diverticulosis   [facility] [name], MD   2 years ago Type 2 diabetes mellitus with diabetic peripheral angiopathy with gangrene, unspecified whether long term insulin use (HCC)   [facility] [name], MD   2 years ago Hematuria, unspecified type   [facility] Cathlean Marseilles A, NP              Passed - Last BP in normal range    BP Readings from Last 1 Encounters:  05/24/23 138/84         Passed - Last Heart Rate in normal range    Pulse Readings from Last 1 Encounters:  05/24/23 61           tamsulosin (FLOMAX) 0.4 MG CAPS capsule [Pharmacy Med Name: Tamsulosin HCl 0.4 MG Oral Capsule] 60 capsule 0    Sig: TAKE 2 CAPSULES BY MOUTH ONCE DAILY AFTER SUPPER     Urology: Alpha-Adrenergic Blocker Failed - 07/12/2023 11:37 AM      Failed - PSA in normal range and within 360 days    PSA  Date Value Ref Range Status  12/16/2015 0.70 <=4.00 ng/mL Final    Comment:    Test Methodology: ECLIA PSA (Electrochemiluminescence Immunoassay)   For PSA values from 2.5-4.0, particularly in younger men <75 years old, the AUA and NCCN suggest testing for % Free PSA (3515) and evaluation of the rate of increase in PSA (PSA velocity).          Failed - Valid encounter within last 12 months    Recent Outpatient Visits           1 year ago Wheezing   [facility] [name], NP   2 years ago Immunization due   [facility] [place],  Priscille Heidelberg, MD   2 years ago Diverticulosis   [facility] Tanya Nones, Priscille Heidelberg, MD   2 years ago Type 2 diabetes mellitus with diabetic peripheral angiopathy with gangrene, unspecified whether long term insulin use (HCC)   [facility] Tanya Nones, Priscille Heidelberg, MD   2 years ago Hematuria, unspecified type   [facility] Cathlean Marseilles A, NP              Passed - Last BP in normal range    BP Readings from Last 1 Encounters:  05/24/23 138/84

## 2023-07-27 ENCOUNTER — Telehealth: Payer: Self-pay

## 2023-07-27 ENCOUNTER — Other Ambulatory Visit: Payer: Self-pay

## 2023-07-27 DIAGNOSIS — R339 Retention of urine, unspecified: Secondary | ICD-10-CM

## 2023-07-27 MED ORDER — TAMSULOSIN HCL 0.4 MG PO CAPS: 0.4000 mg | ORAL_CAPSULE | Freq: Every day | ORAL | 0 refills | Status: DC

## 2023-07-27 NOTE — Telephone Encounter (Signed)
Prescription Request  07/27/2023  LOV: 03/19/23  What is the name of the medication or equipment? tamsulosin (FLOMAX) 0.4 MG CAPS capsule [[id number]]   Have you contacted your pharmacy to request a refill? Yes   Which pharmacy would you like this sent to?  [facility] [address], [place] - [address] [place] [address] Phone: [phone] Fax: [phone]    Patient notified that their request is being sent to the clinical staff for review and that they should receive a response within 2 business days.   Please advise at [facility] [phone]

## 2023-08-06 DIAGNOSIS — Z515 Encounter for palliative care: Secondary | ICD-10-CM | POA: Diagnosis not present

## 2023-08-06 DIAGNOSIS — Z89512 Acquired absence of left leg below knee: Secondary | ICD-10-CM | POA: Diagnosis not present

## 2023-08-06 DIAGNOSIS — Z9714 Presence of artificial left leg (complete) (partial): Secondary | ICD-10-CM | POA: Diagnosis not present

## 2023-08-06 DIAGNOSIS — M4802 Spinal stenosis, cervical region: Secondary | ICD-10-CM | POA: Diagnosis not present

## 2023-08-06 DIAGNOSIS — D692 Other nonthrombocytopenic purpura: Secondary | ICD-10-CM | POA: Diagnosis not present

## 2023-08-10 DIAGNOSIS — H353221 Exudative age-related macular degeneration, left eye, with active choroidal neovascularization: Secondary | ICD-10-CM | POA: Diagnosis not present

## 2023-08-12 ENCOUNTER — Telehealth: Payer: Self-pay

## 2023-08-12 NOTE — Telephone Encounter (Signed)
 Copied from CRM [id number]. Topic: Clinical - Medical Advice >> Aug 12, 2023 12:53 PM Montie POUR wrote: Reason for CRM: Keinan's son Francis called and they need a prescription from the doctor to purchase a lift chair that goes up and down the steps from basement (they go out and in basement door) to upper floor that is his primary floor due to mobility and getting up and down steps. Please call and son will pick the prescription up at the office. Call [place] at [phone]

## 2023-09-06 DIAGNOSIS — N401 Enlarged prostate with lower urinary tract symptoms: Secondary | ICD-10-CM | POA: Diagnosis not present

## 2023-09-06 DIAGNOSIS — R3914 Feeling of incomplete bladder emptying: Secondary | ICD-10-CM | POA: Diagnosis not present

## 2023-09-06 DIAGNOSIS — N3020 Other chronic cystitis without hematuria: Secondary | ICD-10-CM | POA: Diagnosis not present

## 2023-09-08 ENCOUNTER — Other Ambulatory Visit: Payer: Self-pay | Admitting: Family Medicine

## 2023-09-08 DIAGNOSIS — I10 Essential (primary) hypertension: Secondary | ICD-10-CM

## 2023-09-08 DIAGNOSIS — I739 Peripheral vascular disease, unspecified: Secondary | ICD-10-CM

## 2023-09-08 NOTE — Telephone Encounter (Signed)
Requested Prescriptions  Pending Prescriptions Disp Refills   pentoxifylline (TRENTAL) 400 MG CR tablet [Pharmacy Med Name: Pentoxifylline ER 400 MG Oral Tablet Extended Release] 180 tablet 0    Sig: TAKE 1 TABLET BY MOUTH EVERY 12 HOURS     Hematology:  Antiplatelets - pentoxifylline Failed - 09/08/2023  3:37 PM      Failed - Cr in normal range and within 360 days    Creat  Date Value Ref Range Status  12/28/2022 1.23 (H) 0.70 - 1.22 mg/dL Final   Creatinine, Ser  Date Value Ref Range Status  05/24/2023 1.45 (H) 0.61 - 1.24 mg/dL Final   Creatinine,U  Date Value Ref Range Status  02/19/2011 48.6 mg/dL Final   Creatinine, Urine  Date Value Ref Range Status  08/13/2016 62 20 - 370 mg/dL Final         Failed - HGB in normal range and within 180 days    Hemoglobin  Date Value Ref Range Status  05/24/2023 11.6 (L) 13.0 - 17.0 g/dL Final  78/29/5621 30.8 (L) 13.0 - 17.7 g/dL Final         Failed - HCT in normal range and within 180 days    HCT  Date Value Ref Range Status  05/24/2023 35.5 (L) 39.0 - 52.0 % Final   Hematocrit  Date Value Ref Range Status  09/28/2022 35.2 (L) 37.5 - 51.0 % Final         Failed - Valid encounter within last 6 months    Recent Outpatient Visits           2 years ago Wheezing   [facility] [name], NP   2 years ago Immunization due   [facility] Pickard, Priscille Heidelberg, MD   2 years ago Diverticulosis   [facility] [name], MD   2 years ago Type 2 diabetes mellitus with diabetic peripheral angiopathy with gangrene, unspecified whether long term insulin use (HCC)   [facility] [name], MD   3 years ago Hematuria, unspecified type   [facility] Cathlean Marseilles A, NP              Passed - PLT in normal range and within 180 days    Platelets  Date Value Ref Range Status  05/24/2023 167 150 - 400 K/uL Final  09/28/2022 216  150 - 450 x10E3/uL Final

## 2023-09-13 ENCOUNTER — Other Ambulatory Visit: Payer: Self-pay | Admitting: Family Medicine

## 2023-09-13 DIAGNOSIS — H353221 Exudative age-related macular degeneration, left eye, with active choroidal neovascularization: Secondary | ICD-10-CM | POA: Diagnosis not present

## 2023-09-13 DIAGNOSIS — I10 Essential (primary) hypertension: Secondary | ICD-10-CM

## 2023-09-13 DIAGNOSIS — I739 Peripheral vascular disease, unspecified: Secondary | ICD-10-CM

## 2023-09-14 ENCOUNTER — Other Ambulatory Visit: Payer: Self-pay | Admitting: Family Medicine

## 2023-09-14 DIAGNOSIS — R339 Retention of urine, unspecified: Secondary | ICD-10-CM

## 2023-10-18 ENCOUNTER — Other Ambulatory Visit: Payer: Self-pay | Admitting: Family Medicine

## 2023-10-18 DIAGNOSIS — I10 Essential (primary) hypertension: Secondary | ICD-10-CM

## 2023-10-19 NOTE — Telephone Encounter (Signed)
 Requested medication (s) are due for refill today - yes  Requested medication (s) are on the active medication list -yes  Future visit scheduled -no  Last refill: 09/30/23 #15  Notes to clinic: last refill was courtesy refill- sent for request review   Requested Prescriptions  Pending Prescriptions Disp Refills   atenolol (TENORMIN) 25 MG tablet [Pharmacy Med Name: Atenolol 25 MG Oral Tablet] 15 tablet 0    Sig: Take 1/2 (one-half) tablet by mouth once daily     Cardiovascular: Beta Blockers 2 Failed - 10/19/2023  2:23 PM      Failed - Cr in normal range and within 360 days    Creat  Date Value Ref Range Status  12/28/2022 1.23 (H) 0.70 - 1.22 mg/dL Final   Creatinine, Ser  Date Value Ref Range Status  05/24/2023 1.45 (H) 0.61 - 1.24 mg/dL Final   Creatinine,U  Date Value Ref Range Status  02/19/2011 48.6 mg/dL Final   Creatinine, Urine  Date Value Ref Range Status  08/13/2016 62 20 - 370 mg/dL Final         Failed - Valid encounter within last 6 months    Recent Outpatient Visits           2 years ago Wheezing   [facility] [name], NP   2 years ago Immunization due   [facility] Pickard, Priscille Heidelberg, MD   2 years ago Diverticulosis   [facility] [name], MD   2 years ago Type 2 diabetes mellitus with diabetic peripheral angiopathy with gangrene, unspecified whether long term insulin use (HCC)   [facility] Tanya Nones, Priscille Heidelberg, MD   3 years ago Hematuria, unspecified type   [facility] Cathlean Marseilles A, NP              Passed - Last BP in normal range    BP Readings from Last 1 Encounters:  05/24/23 138/84         Passed - Last Heart Rate in normal range    Pulse Readings from Last 1 Encounters:  05/24/23 61            Requested Prescriptions  Pending Prescriptions Disp Refills   atenolol (TENORMIN) 25 MG tablet [Pharmacy Med Name: Atenolol 25  MG Oral Tablet] 15 tablet 0    Sig: Take 1/2 (one-half) tablet by mouth once daily     Cardiovascular: Beta Blockers 2 Failed - 10/19/2023  2:23 PM      Failed - Cr in normal range and within 360 days    Creat  Date Value Ref Range Status  12/28/2022 1.23 (H) 0.70 - 1.22 mg/dL Final   Creatinine, Ser  Date Value Ref Range Status  05/24/2023 1.45 (H) 0.61 - 1.24 mg/dL Final   Creatinine,U  Date Value Ref Range Status  02/19/2011 48.6 mg/dL Final   Creatinine, Urine  Date Value Ref Range Status  08/13/2016 62 20 - 370 mg/dL Final         Failed - Valid encounter within last 6 months    Recent Outpatient Visits           2 years ago Wheezing   [facility] [name], NP   2 years ago Immunization due   [facility] Pickard, Priscille Heidelberg, MD   2 years ago Diverticulosis   [facility] Pickard, Priscille Heidelberg, MD  2 years ago Type 2 diabetes mellitus with diabetic peripheral angiopathy with gangrene, unspecified whether long term insulin use (HCC)   [facility] Pickard, Priscille Heidelberg, MD   3 years ago Hematuria, unspecified type   [facility] Cathlean Marseilles A, NP              Passed - Last BP in normal range    BP Readings from Last 1 Encounters:  05/24/23 138/84         Passed - Last Heart Rate in normal range    Pulse Readings from Last 1 Encounters:  05/24/23 61

## 2023-10-20 ENCOUNTER — Other Ambulatory Visit (HOSPITAL_COMMUNITY): Payer: Self-pay

## 2023-10-25 DIAGNOSIS — H353221 Exudative age-related macular degeneration, left eye, with active choroidal neovascularization: Secondary | ICD-10-CM | POA: Diagnosis not present

## 2023-10-28 ENCOUNTER — Emergency Department (HOSPITAL_COMMUNITY)

## 2023-10-28 ENCOUNTER — Emergency Department (HOSPITAL_COMMUNITY)
Admission: EM | Admit: 2023-10-28 | Discharge: 2023-10-29 | Disposition: A | Discharge status: Home / Self Care | Attending: Emergency Medicine | Admitting: Emergency Medicine

## 2023-10-28 ENCOUNTER — Other Ambulatory Visit: Payer: Self-pay

## 2023-10-28 ENCOUNTER — Encounter (HOSPITAL_COMMUNITY): Payer: Self-pay

## 2023-10-28 DIAGNOSIS — Z79899 Other long term (current) drug therapy: Secondary | ICD-10-CM | POA: Diagnosis not present

## 2023-10-28 DIAGNOSIS — E119 Type 2 diabetes mellitus without complications: Secondary | ICD-10-CM | POA: Insufficient documentation

## 2023-10-28 DIAGNOSIS — R0602 Shortness of breath: Secondary | ICD-10-CM | POA: Insufficient documentation

## 2023-10-28 DIAGNOSIS — N183 Chronic kidney disease, stage 3 unspecified: Secondary | ICD-10-CM | POA: Insufficient documentation

## 2023-10-28 DIAGNOSIS — J9811 Atelectasis: Secondary | ICD-10-CM | POA: Diagnosis not present

## 2023-10-28 DIAGNOSIS — I517 Cardiomegaly: Secondary | ICD-10-CM | POA: Diagnosis not present

## 2023-10-28 DIAGNOSIS — R600 Localized edema: Secondary | ICD-10-CM | POA: Diagnosis not present

## 2023-10-28 DIAGNOSIS — I5030 Unspecified diastolic (congestive) heart failure: Secondary | ICD-10-CM | POA: Diagnosis not present

## 2023-10-28 DIAGNOSIS — R39198 Other difficulties with micturition: Secondary | ICD-10-CM | POA: Diagnosis not present

## 2023-10-28 DIAGNOSIS — Z7901 Long term (current) use of anticoagulants: Secondary | ICD-10-CM | POA: Insufficient documentation

## 2023-10-28 DIAGNOSIS — I130 Hypertensive heart and chronic kidney disease with heart failure and stage 1 through stage 4 chronic kidney disease, or unspecified chronic kidney disease: Secondary | ICD-10-CM | POA: Insufficient documentation

## 2023-10-28 DIAGNOSIS — J90 Pleural effusion, not elsewhere classified: Secondary | ICD-10-CM | POA: Diagnosis not present

## 2023-10-28 DIAGNOSIS — Z8679 Personal history of other diseases of the circulatory system: Secondary | ICD-10-CM

## 2023-10-28 DIAGNOSIS — I2510 Atherosclerotic heart disease of native coronary artery without angina pectoris: Secondary | ICD-10-CM | POA: Diagnosis not present

## 2023-10-28 DIAGNOSIS — R339 Retention of urine, unspecified: Secondary | ICD-10-CM | POA: Insufficient documentation

## 2023-10-28 DIAGNOSIS — Z85038 Personal history of other malignant neoplasm of large intestine: Secondary | ICD-10-CM | POA: Insufficient documentation

## 2023-10-28 LAB — CBC WITH DIFFERENTIAL/PLATELET
Abs Immature Granulocytes: 0.03 10*3/uL (ref 0.00–0.07)
Basophils Absolute: 0.1 10*3/uL (ref 0.0–0.1)
Basophils Relative: 1 %
Eosinophils Absolute: 0.2 10*3/uL (ref 0.0–0.5)
Eosinophils Relative: 2 %
HCT: 34 % — ABNORMAL LOW (ref 39.0–52.0)
Hemoglobin: 10.9 g/dL — ABNORMAL LOW (ref 13.0–17.0)
Immature Granulocytes: 0 %
Lymphocytes Relative: 31 %
Lymphs Abs: 3.2 10*3/uL (ref 0.7–4.0)
MCH: 30.1 pg (ref 26.0–34.0)
MCHC: 32.1 g/dL (ref 30.0–36.0)
MCV: 93.9 fL (ref 80.0–100.0)
Monocytes Absolute: 0.7 10*3/uL (ref 0.1–1.0)
Monocytes Relative: 7 %
Neutro Abs: 6.1 10*3/uL (ref 1.7–7.7)
Neutrophils Relative %: 59 %
Platelets: 175 10*3/uL (ref 150–400)
RBC: 3.62 MIL/uL — ABNORMAL LOW (ref 4.22–5.81)
RDW: 16 % — ABNORMAL HIGH (ref 11.5–15.5)
WBC: 10.3 10*3/uL (ref 4.0–10.5)
nRBC: 0 % (ref 0.0–0.2)

## 2023-10-28 LAB — BASIC METABOLIC PANEL
Anion gap: 8 (ref 5–15)
BUN: 28 mg/dL — ABNORMAL HIGH (ref 8–23)
CO2: 23 mmol/L (ref 22–32)
Calcium: 8.9 mg/dL (ref 8.9–10.3)
Chloride: 105 mmol/L (ref 98–111)
Creatinine, Ser: 1.47 mg/dL — ABNORMAL HIGH (ref 0.61–1.24)
GFR, Estimated: 44 mL/min — ABNORMAL LOW (ref >60)
Glucose, Bld: 93 mg/dL (ref 70–99)
Potassium: 3.8 mmol/L (ref 3.5–5.1)
Sodium: 136 mmol/L (ref 135–145)

## 2023-10-28 LAB — BRAIN NATRIURETIC PEPTIDE: B Natriuretic Peptide: 436.2 pg/mL — ABNORMAL HIGH (ref 0.0–100.0)

## 2023-10-28 NOTE — ED Triage Notes (Addendum)
 Pt reports shortness of breath and fluid retention in his right leg "my leg feel heavy." Onset yesterday. Left leg is BKA. He reports trouble urinating, last void 3 hours ago, "the stream is small, down to nothing." Denies chest pain.

## 2023-10-28 NOTE — ED Provider Triage Note (Signed)
 Emergency Medicine Provider Triage Evaluation Note  Eric [name]. , a 89 y.o. male  was evaluated in triage.  Pt complains of sob. Report increased sob and swelling to R leg (has L BKA) x 2 days, not improved with diuretic.  No fever, cp, productive cough.  No hx of DVT  Review of Systems  Positive: As above Negative: As above  Physical Exam  BP (!) 117/103 (BP Location: Right Arm)   Pulse 61   Temp 98.4 F (36.9 C) (Oral)   Resp 16   Ht 5\' 5"  (1.651 m)   Wt 69.4 kg   SpO2 97%   BMI 25.46 kg/m  Gen:   Awake, no distress   Resp:  Normal effort  MSK:   Moves extremities without difficulty  Other:    Medical Decision Making  Medically screening exam initiated at 3:17 PM.  Appropriate orders placed.  [name] was informed that the remainder of the evaluation will be completed by another provider, this initial triage assessment does not replace that evaluation, and the importance of remaining in the ED until their evaluation is complete.     Fayrene Helper, PA-C 10/28/23 [phone]

## 2023-10-29 LAB — URINALYSIS, W/ REFLEX TO CULTURE (INFECTION SUSPECTED)
Bilirubin Urine: NEGATIVE
Glucose, UA: NEGATIVE mg/dL
Ketones, ur: NEGATIVE mg/dL
Nitrite: NEGATIVE
Protein, ur: 30 mg/dL — AB
Specific Gravity, Urine: 1.009 (ref 1.005–1.030)
WBC, UA: >50 WBC/hpf (ref 0–5)
pH: 6 (ref 5.0–8.0)

## 2023-10-29 MED ORDER — CEPHALEXIN 500 MG PO CAPS
500.0000 mg | ORAL_CAPSULE | Freq: Two times a day (BID) | ORAL | 0 refills | Status: DC
Start: 2023-10-29 — End: 2023-11-08

## 2023-10-29 MED ORDER — CEPHALEXIN 250 MG PO CAPS
500.0000 mg | ORAL_CAPSULE | Freq: Once | ORAL | Status: AC
  Administered 2023-10-29: 500 mg via ORAL
  Filled 2023-10-29: qty 2

## 2023-10-29 NOTE — Discharge Instructions (Addendum)
 Thank you for allowing [place] to take care of you today.  We hope you begin feeling better soon.  To-Do: Please follow-up with your primary doctor. Please take your antibiotics as prescribed for your urinary tract infection. Follow up with Urology for catheter removal assessment. Please take an additional 20 mg dose of your Lasix around 5 PM (in addition to the 40 mg in the morning) for the next 3 days to help with your peripheral edema.  On Monday you can return to your normal dosing. Please return to the Emergency Department or call 911 if you experience chest pain, shortness of breath, severe pain, severe fever, altered mental status, or have any reason to think that you need emergency medical care.  Thank you again.  Hope you feel better soon.  Department of Emergency Medicine [facility]

## 2023-10-29 NOTE — ED Notes (Signed)
 Previous  RN reviewed discharge instructions with patient. SPT well appearing upon discharge and reports no pain. Pt ambulated with stable gait to exit. Pt endorses ride home.

## 2023-10-29 NOTE — ED Provider Notes (Signed)
 [place] EMERGENCY DEPARTMENT AT [facility] Provider Note  CSN: [id number] Arrival date & time: 10/28/23 1358  Chief Complaint(s) Shortness of Breath  HPI Eric [name]. is a 89 y.o. male with a past medical history listed below including hypertension, atrial flutter/A-fib on blood thinners, diastolic heart failure here for shortness of breath and increased peripheral edema gradually worsening over the past several days.  Yesterday patient reported that he had difficulty urinating.  Reports that he is compliant with his medications including his diuretics.  He denied any associated chest pain.  Endorses shortness of breath with bending down.  No exertional shortness of breath.  No fevers or chills.  No coughing or congestion.  While in the waiting room, patient reports having had 3 good urine outputs and feeling much better.  The history is provided by the patient.    Past Medical History Past Medical History:  Diagnosis Date   Atrial flutter (HCC)    s/p RFCA   CAD (coronary artery disease)    cath 2003, occluded S-RCA, occluded S-Dx, L-LAD ok, s/p PTCA to LAD   Cataract    CKD (chronic kidney disease) stage 3, GFR 30-59 ml/min (HCC)    Diabetes mellitus    diet controlled   Hearing aid worn    bilateral   History of kidney stones    Long term (current) use of anticoagulants    Neuromuscular disorder (HCC)    OSA (obstructive sleep apnea) 12/11   very mild, AHI 7/hr   Persistent atrial fibrillation (HCC) 09/26/2015   Pure hyperglyceridemia    PVD (peripheral vascular disease) (HCC)    angioplasty of his right lower extremity in [place] by Dr.[name] 2013   Unspecified essential hypertension    Wears dentures    full upper and lower   Patient Active Problem List   Diagnosis Date Noted   CVA (cerebral vascular accident) (HCC) 03/08/2023   Thrombocytopenia (HCC) 03/08/2023   Abnormal gait 02/12/2023   Coping style affecting medical condition 12/11/2022    Traumatic closed trochanteric fracture of femur with minimal displacement with routine healing, left 12/05/2022   Greater trochanter fracture (HCC) 12/04/2022   Closed left femoral fracture (HCC) 11/30/2022   Pain due to onychomycosis of toenails of both feet 04/23/2022   Hammer toes, bilateral 04/23/2022   Porokeratosis 04/23/2022   Diverticulosis of colon with hemorrhage    Erosive gastritis    Benign neoplasm of transverse colon    Benign neoplasm of descending colon    Acute GI bleeding 04/27/2021   Acute blood loss anemia 04/27/2021   Acute kidney injury superimposed on CKD (HCC) 04/27/2021   Hematuria 07/24/2020   Hypertensive urgency 03/08/2019   Hx of BKA, left (HCC) 01/13/2018   Nausea and vomiting in adult 12/26/2017   Pressure injury of skin 12/23/2017   C. difficile colitis 12/22/2017   UTI (urinary tract infection) 12/22/2017   Hyponatremia 12/22/2017   Normocytic anemia 12/22/2017   ASO (arteriosclerosis obliterans) 11/23/2017   Chronic anticoagulation 09/26/2015   History of anticoagulant therapy 09/26/2015   PVD (peripheral vascular disease) (HCC) 06/02/2012   Type 2 diabetes mellitus (HCC) 05/17/2012   Benign prostatic hyperplasia 11/16/2011   AKI (acute kidney injury) (HCC) 08/26/2011   Chronic kidney disease (CKD), stage III (moderate) (HCC) 08/26/2011   Chronic coronary artery disease 03/25/2011   Obstructive sleep apnea 09/15/2010   Other specified extrapyramidal and movement disorders 09/15/2010   History of colonic polyps 10/25/2009   Vitamin B-complex deficiency  03/04/2009   ATRIAL FIBRILLATION 12/25/2008   Atrial flutter (HCC) 12/25/2008   Hyperlipidemia 11/24/2006   Essential hypertension 11/24/2006   Home Medication(s) Prior to Admission medications   Medication Sig Start Date End Date Taking? Authorizing Provider  cephALEXin (KEFLEX) 500 MG capsule Take 1 capsule (500 mg total) by mouth 2 (two) times daily for 10 days. 10/29/23 11/08/23 Yes  Jovi Zavadil, Amadeo Garnet, MD  acetaminophen (TYLENOL) 325 MG tablet Take 2 tablets (650 mg total) by mouth every 6 (six) hours as needed for mild pain (or temp >/= 101 F). 12/21/17   Alford Highland, MD  amLODipine (NORVASC) 10 MG tablet Take 1 tablet by mouth once daily 06/08/23   [name], MD  apixaban (ELIQUIS) 2.5 MG TABS tablet TAKE 1 TABLET BY MOUTH TWICE DAILY (OFFICE  VISIT  NEEDED  FOR  ADDITIONAL  REFILLS) 05/13/23   [name], MD  atenolol (TENORMIN) 25 MG tablet Take 1/2 (one-half) tablet by mouth once daily 10/19/23   [name], MD  bethanechol (URECHOLINE) 10 MG tablet TAKE 1/2 (ONE-HALF) TABLET BY MOUTH THREE TIMES DAILY 09/14/23   [name], MD  Cinnamon 500 MG TABS Take 500 mg by mouth daily.    [provider]  Cranberry 500 MG CAPS Take 500 mg by mouth daily.    [provider]  cyanocobalamin (VITAMIN B12) 500 MCG tablet Take 500 mcg by mouth daily.    [provider]  ferrous sulfate 325 (65 FE) MG EC tablet Take 325 mg by mouth at bedtime.    [provider]  finasteride (PROSCAR) 5 MG tablet TAKE 1 TABLET BY MOUTH ONCE DAILY 03/28/18   [name], MD  fluticasone [facility]) 50 MCG/ACT nasal spray Place 1 spray into both nostrils daily. 12/17/22   Angiulli, Mcarthur Rossetti, PA-C  furosemide (LASIX) 40 MG tablet Take 1 tablet by mouth once daily 06/21/23   Pricilla Riffle, MD  hydrALAZINE (APRESOLINE) 25 MG tablet TAKE 3 TABLETS BY MOUTH IN THE MORNING AND 3 AT BEDTIME 06/15/23   Pricilla Riffle, MD  meclizine (ANTIVERT) 12.5 MG tablet Take 1 tablet (12.5 mg total) by mouth 3 (three) times daily as needed for dizziness. 05/24/23   Durwin Glaze, MD  Multiple Vitamin (DAILY MULTIVITAMIN PO) Take 1 tablet by mouth daily.    [provider]  ondansetron (ZOFRAN) 4 MG tablet Take 1 tablet (4 mg total) by mouth every 6 (six) hours. 05/24/23   Durwin Glaze, MD  pentoxifylline (TRENTAL) 400 MG CR tablet TAKE 1 TABLET BY  MOUTH EVERY 12 HOURS 09/13/23   [name], MD  rosuvastatin (CRESTOR) 40 MG tablet Take 1 tablet by mouth once daily 06/08/23   [name], MD  tamsulosin (FLOMAX) 0.4 MG CAPS capsule TAKE 2 CAPSULES BY MOUTH ONCE DAILY AFTER SUPPER 09/30/23   [name], MD  tamsulosin (FLOMAX) 0.4 MG CAPS capsule Take 1 capsule (0.4 mg total) by mouth daily after supper. 07/27/23   [name], MD  Allergies Altace [ramipril], Isosorbide mononitrate, and Quinidine gluconate  Review of Systems Review of Systems As noted in HPI  Physical Exam Vital Signs  I have reviewed the triage vital signs BP (!) 143/50   Pulse 66   Temp 98.1 F (36.7 C) (Oral)   Resp 15   Ht 5\' 5"  (1.651 m)   Wt 69.4 kg   SpO2 94%   BMI 25.46 kg/m   Physical Exam Vitals reviewed.  Constitutional:      General: He is not in acute distress.    Appearance: He is well-developed. He is not diaphoretic.  HENT:     Head: Normocephalic and atraumatic.     Nose: Nose normal.  Eyes:     General: No scleral icterus.       Right eye: No discharge.        Left eye: No discharge.     Conjunctiva/sclera: Conjunctivae normal.     Pupils: Pupils are equal, round, and reactive to light.  Cardiovascular:     Rate and Rhythm: Normal rate and regular rhythm.     Heart sounds: No murmur heard.    No friction rub. No gallop.  Pulmonary:     Effort: Pulmonary effort is normal. No respiratory distress.     Breath sounds: Normal breath sounds. No stridor. No rales.  Abdominal:     General: There is no distension.     Palpations: Abdomen is soft.     Tenderness: There is no abdominal tenderness.  Musculoskeletal:        General: No tenderness.     Cervical back: Normal range of motion and neck supple.     Right lower leg: 2+ Edema present.     Left Lower Extremity: Left leg is  amputated below knee.  Skin:    General: Skin is warm and dry.     Findings: No erythema or rash.  Neurological:     Mental Status: He is alert and oriented to person, place, and time.     ED Results and Treatments Labs (all labs ordered are listed, but only abnormal results are displayed) Labs Reviewed  BASIC METABOLIC PANEL - Abnormal; Notable for the following components:      Result Value   BUN 28 (*)    Creatinine, Ser 1.47 (*)    GFR, Estimated 44 (*)    All other components within normal limits  CBC WITH DIFFERENTIAL/PLATELET - Abnormal; Notable for the following components:   RBC 3.62 (*)    Hemoglobin 10.9 (*)    HCT 34.0 (*)    RDW 16.0 (*)    All other components within normal limits  BRAIN NATRIURETIC PEPTIDE - Abnormal; Notable for the following components:   B Natriuretic Peptide 436.2 (*)    All other components within normal limits  URINALYSIS, W/ REFLEX TO CULTURE (INFECTION SUSPECTED) - Abnormal; Notable for the following components:   APPearance CLOUDY (*)    Hgb urine dipstick SMALL (*)    Protein, ur 30 (*)    Leukocytes,Ua LARGE (*)    Bacteria, UA RARE (*)    All other components within normal limits  URINE CULTURE  EKG  EKG Interpretation Date/Time:  Thursday October 28 2023 15:16:27 EDT Ventricular Rate:  66 PR Interval:    QRS Duration:  94 QT Interval:  430 QTC Calculation: 450 R Axis:   4  Text Interpretation: Atrial fibrillation Possible Anterior infarct , age undetermined Abnormal ECG When compared with ECG of 24-May-2023 06:39, PREVIOUS ECG IS PRESENT Confirmed by Drema Pry [phone]) on 10/28/2023 11:52:30 PM       Radiology DG Chest 2 View Result Date: 10/28/2023 CLINICAL DATA:  Shortness of breath. EXAM: CHEST - 2 VIEW COMPARISON:  Dec 15, 2022. FINDINGS: Stable cardiomegaly. Status post coronary artery bypass  graft. Minimal bibasilar subsegmental atelectasis is noted with small pleural effusions. Bony thorax is unremarkable. IMPRESSION: Minimal bibasilar subsegmental atelectasis with small pleural effusions. Electronically Signed   By: Lupita Raider M.D.   On: 10/28/2023 16:20    Medications Ordered in ED Medications  cephALEXin (KEFLEX) capsule 500 mg (500 mg Oral Given 10/29/23 0510)   Procedures Procedures  (including critical care time) Medical Decision Making / ED Course   Medical Decision Making Amount and/or Complexity of Data Reviewed Labs: ordered. Decision-making details documented in ED Course. Radiology: ordered and independent interpretation performed. Decision-making details documented in ED Course. ECG/medicine tests: ordered and independent interpretation performed. Decision-making details documented in ED Course.  Risk Prescription drug management. Decision regarding hospitalization.    Patient presents with shortness of breath with bending down and increased peripheral edema.  Also reports decreased urine output.  Differential diagnosis considered workup below.  CHF exacerbation, pleural effusions, pneumonia, pneumothorax, severe anemia.  Urinary retention and AKI considered for decreased UOP (already voiding on his own)  CBC without leukocytosis.  Mild anemia with stable hemoglobin. Metabolic panel without significant electrolyte derangements or renal sufficiency.  Renal insufficiency without evidence of AKI. BNP 430.  No prior for comparison. Chest x-ray with small bilateral pleural effusions.  No evidence of pulmonary edema, pneumonia, pneumothorax. EKG notable for A-fib with rate control. UA with evidence of infection. Keflex given. PVR approx 600cc after voiding 200cc. Foley catheter placed. Urology f/u recommended.  Patient is able to ambulate without significant increased work of breathing.  Feel he is stable for continued outpatient management.  Will  increase dose of Lasix for the next 3 days.    Final Clinical Impression(s) / ED Diagnoses Final diagnoses:  Peripheral edema  History of chronic CHF  Urinary retention   The patient appears reasonably screened and/or stabilized for discharge and I doubt any other medical condition or other [facility] requiring further screening, evaluation, or treatment in the ED at this time. I have discussed the findings, Dx and Tx plan with the patient/family who expressed understanding and agree(s) with the plan. Discharge instructions discussed at length. The patient/family was given strict return precautions who verbalized understanding of the instructions. No further questions at time of discharge.  Disposition: Discharge  Condition: Good  ED Discharge Orders          Ordered    cephALEXin (KEFLEX) 500 MG capsule  2 times daily        10/29/23 0620              Follow Up: [name], MD [facility] [address] [place] [address] [phone]  Call  to schedule an appointment for close follow up  [facility] [address] [place] [address] [place] [address] [phone] Call  to schedule an appointment for close follow up  This chart was dictated using voice recognition software.  Despite best efforts to proofread,  errors can occur which can change the documentation meaning.    Nira Conn, MD 10/29/23 [phone]

## 2023-10-31 ENCOUNTER — Other Ambulatory Visit: Payer: Self-pay | Admitting: Family Medicine

## 2023-10-31 DIAGNOSIS — I4892 Unspecified atrial flutter: Secondary | ICD-10-CM

## 2023-10-31 LAB — URINE CULTURE: Culture: >=100000 — AB

## 2023-11-01 ENCOUNTER — Telehealth (HOSPITAL_BASED_OUTPATIENT_CLINIC_OR_DEPARTMENT_OTHER): Payer: Self-pay

## 2023-11-01 NOTE — Progress Notes (Signed)
 ED Antimicrobial Stewardship Positive Culture Follow Up   Eric [name]. is an 89 y.o. male who presented to [facility] on 10/28/2023 with a chief complaint of  Chief Complaint  Patient presents with   Shortness of Breath    Recent Results (from the past 720 hours)  Urine Culture     Status: Abnormal   Collection Time: 10/29/23  1:53 AM   Specimen: Urine, Random  Result Value Ref Range Status   Specimen Description URINE, RANDOM  Final   Special Requests   Final    NONE Reflexed from [id number] Performed at [facility], [address] [place], [place] [address]    Culture >=100,000 COLONIES/mL PSEUDOMONAS AERUGINOSA (A)  Final   Report Status 10/31/2023 FINAL  Final   Organism ID, Bacteria PSEUDOMONAS AERUGINOSA (A)  Final      Susceptibility   Pseudomonas aeruginosa - MIC*    CEFTAZIDIME 2 SENSITIVE Sensitive     CIPROFLOXACIN <=0.25 SENSITIVE Sensitive     GENTAMICIN <=1 SENSITIVE Sensitive     IMIPENEM <=0.25 SENSITIVE Sensitive     PIP/TAZO <=4 SENSITIVE Sensitive ug/mL    CEFEPIME 1 SENSITIVE Sensitive     * >=100,000 COLONIES/mL PSEUDOMONAS AERUGINOSA    [x]  Treated with cephalexin, organism resistant to prescribed antimicrobial  New antibiotic prescription: Ciprofloxacin 500 mg PO once daily for 5 days  ED Provider: Jannifer Hick, PA-C   Lennie Muckle, PharmD PGY1 Pharmacy Resident 11/01/2023 9:23 AM Monday - Friday phone -  [phone] Saturday - Sunday phone - [phone]

## 2023-11-01 NOTE — Telephone Encounter (Signed)
 Post ED Visit - Positive Culture Follow-up: Successful Patient Follow-Up  Culture assessed and recommendations reviewed by:  [x]  Lennie Muckle, Pharm.D. []  Celedonio Miyamoto, Pharm.D., BCPS AQ-ID []  Garvin Fila, Pharm.D., BCPS []  Georgina Pillion, Pharm.D., BCPS []  [place], [address].D., BCPS, AAHIVP []  Estella Husk, Pharm.D., BCPS, AAHIVP []  Lysle Pearl, PharmD, BCPS []  Phillips Climes, PharmD, BCPS []  Agapito Games, PharmD, BCPS []  Verlan Friends, PharmD  Positive urine culture  []  Patient discharged without antimicrobial prescription and treatment is now indicated [x]  Organism is resistant to prescribed ED discharge antimicrobial []  Patient with positive blood cultures  Changes discussed with ED provider: Jannifer Hick, PA-C New antibiotic prescription Ciprofloxacin 500 mg po x 5 days Called/faxed to [facility] staff at [facility], date 11/01/23, time 10:15 am   Sandria Senter 11/01/2023, 10:33 AM

## 2023-11-02 ENCOUNTER — Telehealth: Payer: Self-pay

## 2023-11-02 NOTE — Telephone Encounter (Signed)
 Requested medication (s) are due for refill today: yes  Requested medication (s) are on the active medication list: yes  Last refill:  05/13/23  Future visit scheduled: no  Notes to clinic: Unable to refill per protocol, courtesy refill already given, routing for provider approval.      Requested Prescriptions  Pending Prescriptions Disp Refills   ELIQUIS 2.5 MG TABS tablet [Pharmacy Med Name: Eliquis 2.5 MG Oral Tablet] 180 tablet 0    Sig: TAKE 1 TABLET BY MOUTH TWICE DAILY (OFFICE  VISIT  NEEDED  FOR  ADDITIONAL  REFILLS)     Hematology:  Anticoagulants - apixaban Failed - 11/02/2023  9:02 AM      Failed - HGB in normal range and within 360 days    Hemoglobin  Date Value Ref Range Status  10/28/2023 10.9 (L) 13.0 - 17.0 g/dL Final  04/54/0981 19.1 (L) 13.0 - 17.7 g/dL Final         Failed - HCT in normal range and within 360 days    HCT  Date Value Ref Range Status  10/28/2023 34.0 (L) 39.0 - 52.0 % Final   Hematocrit  Date Value Ref Range Status  09/28/2022 35.2 (L) 37.5 - 51.0 % Final         Failed - Cr in normal range and within 360 days    Creat  Date Value Ref Range Status  12/28/2022 1.23 (H) 0.70 - 1.22 mg/dL Final   Creatinine, Ser  Date Value Ref Range Status  10/28/2023 1.47 (H) 0.61 - 1.24 mg/dL Final   Creatinine,U  Date Value Ref Range Status  02/19/2011 48.6 mg/dL Final   Creatinine, Urine  Date Value Ref Range Status  08/13/2016 62 20 - 370 mg/dL Final         Failed - AST in normal range and within 360 days    AST  Date Value Ref Range Status  03/09/2023 50 (H) 15 - 41 U/L Final         Failed - Valid encounter within last 12 months    Recent Outpatient Visits           2 years ago Wheezing   [facility] [name], NP   2 years ago Immunization due   [facility] Pickard, Priscille Heidelberg, MD   2 years ago Diverticulosis   [facility] [name], MD   2 years ago Type 2  diabetes mellitus with diabetic peripheral angiopathy with gangrene, unspecified whether long term insulin use (HCC)   [facility] [name], MD   3 years ago Hematuria, unspecified type   [facility] Cathlean Marseilles A, NP              Passed - PLT in normal range and within 360 days    Platelets  Date Value Ref Range Status  10/28/2023 175 150 - 400 K/uL Final  09/28/2022 216 150 - 450 x10E3/uL Final         Passed - ALT in normal range and within 360 days    ALT  Date Value Ref Range Status  03/09/2023 29 0 - 44 U/L Final

## 2023-11-02 NOTE — Transitions of Care (Post Inpatient/ED Visit) (Signed)
 11/02/2023  Name: Eric [name]. MRN: [id number] DOB: 10/26/1927  Today's TOC FU Call Status: Today's TOC FU Call Status:: Successful TOC FU Call Completed TOC FU Call Complete Date: 11/02/23 Patient's Name and Date of Birth confirmed.  Transition Care Management Follow-up Telephone Call Date of Discharge: 10/29/23 Discharge Facility: [facility]) Type of Discharge: Emergency Department Reason for ED Visit: Other: (edema) How have you been since you were released from the hospital?: Better Any questions or concerns?: No  Items Reviewed: Did you receive and understand the discharge instructions provided?: Yes Medications obtained,verified, and reconciled?: Yes (Medications Reviewed) Any new allergies since your discharge?: No Dietary orders reviewed?: Yes Do you have support at home?: Yes People in Home: child(ren), adult  Medications Reviewed Today: Medications Reviewed Today     Reviewed by Karena Addison, LPN (Licensed Practical Nurse) on 11/02/23 at 1419  Med List Status: <None>   Medication Order Taking? Sig Documenting Provider Last Dose Status Informant  acetaminophen (TYLENOL) 325 MG tablet [id number] No Take 2 tablets (650 mg total) by mouth every 6 (six) hours as needed for mild pain (or temp >/= 101 F). Alford Highland, MD Taking Active Self, Child  amLODipine (NORVASC) 10 MG tablet [id number]  Take 1 tablet by mouth once daily [name], MD  Active   apixaban (ELIQUIS) 2.5 MG TABS tablet [id number]  TAKE 1 TABLET BY MOUTH TWICE DAILY (OFFICE  VISIT  NEEDED  FOR  ADDITIONAL  REFILLS) [name], MD  Active   atenolol (TENORMIN) 25 MG tablet [id number]  Take 1/2 (one-half) tablet by mouth once daily [name], MD  Active   bethanechol (URECHOLINE) 10 MG tablet [id number]  TAKE 1/2 (ONE-HALF) TABLET BY MOUTH THREE TIMES DAILY [name], MD  Active   cephALEXin (KEFLEX) 500 MG capsule [id number]  Take 1 capsule (500 mg total) by mouth 2  (two) times daily for 10 days. Nira Conn, MD  Active   Cinnamon 500 MG TABS [id number] No Take 500 mg by mouth daily. [provider] Taking Active Self, Child  Cranberry 500 MG CAPS [id number] No Take 500 mg by mouth daily. [provider] Taking Active Self, Child  cyanocobalamin (VITAMIN B12) 500 MCG tablet [id number] No Take 500 mcg by mouth daily. [provider] Taking Active Self, Child  ferrous sulfate 325 (65 FE) MG EC tablet [id number] No Take 325 mg by mouth at bedtime. [provider] Taking Active Self, Child  finasteride (PROSCAR) 5 MG tablet [id number] No TAKE 1 TABLET BY MOUTH ONCE DAILY Pickard, Priscille Heidelberg, MD Taking Active Self, Child  fluticasone (FLONASE) 50 MCG/ACT nasal spray [id number] No Place 1 spray into both nostrils daily. Charlton Amor, PA-C Taking Active Child, Self  furosemide (LASIX) 40 MG tablet [id number]  Take 1 tablet by mouth once daily Pricilla Riffle, MD  Active   hydrALAZINE (APRESOLINE) 25 MG tablet [id number]  TAKE 3 TABLETS BY MOUTH IN THE MORNING AND 3 AT BEDTIME Pricilla Riffle, MD  Active   meclizine (ANTIVERT) 12.5 MG tablet [id number]  Take 1 tablet (12.5 mg total) by mouth 3 (three) times daily as needed for dizziness. Durwin Glaze, MD  Active   Multiple Vitamin (DAILY MULTIVITAMIN PO) [id number] No Take 1 tablet by mouth daily. [provider] Taking Active Self, Child  ondansetron (ZOFRAN) 4 MG tablet [id number]  Take 1 tablet (4 mg total) by mouth every 6 (six) hours. Durwin Glaze, MD  Active  pentoxifylline (TRENTAL) 400 MG CR tablet [id number]  TAKE 1 TABLET BY MOUTH EVERY 12 HOURS Pickard, Priscille Heidelberg, MD  Active   rosuvastatin (CRESTOR) 40 MG tablet [id number]  Take 1 tablet by mouth once daily [name], MD  Active   tamsulosin (FLOMAX) 0.4 MG CAPS capsule [id number]  TAKE 2 CAPSULES BY MOUTH ONCE DAILY AFTER SUPPER [name], MD  Active   tamsulosin [facility]) 0.4 MG CAPS capsule [id number]   Take 1 capsule (0.4 mg total) by mouth daily after supper. [name], MD  Active             Home Care and Equipment/Supplies: Were Home Health Services Ordered?: NA Any new equipment or medical supplies ordered?: NA  Functional Questionnaire: Do you need assistance with bathing/showering or dressing?: No Do you need assistance with meal preparation?: No Do you need assistance with eating?: No Do you have difficulty maintaining continence: No Do you need assistance with getting out of bed/getting out of a chair/moving?: No Do you have difficulty managing or taking your medications?: No  Follow up appointments reviewed: PCP Follow-up appointment confirmed?: Yes Date of PCP follow-up appointment?: 11/08/23 Follow-up Provider: [facility] Follow-up appointment confirmed?: No Reason Specialist Follow-Up Not Confirmed: Patient has Specialist Provider Number and will Call for Appointment Do you need transportation to your follow-up appointment?: No Do you understand care options if your condition(s) worsen?: Yes-patient verbalized understanding    SIGNATURE Karena Addison, LPN [facility] Nurse Health Advisor Direct Dial [phone]

## 2023-11-08 ENCOUNTER — Ambulatory Visit (INDEPENDENT_AMBULATORY_CARE_PROVIDER_SITE_OTHER): Admitting: Family Medicine

## 2023-11-08 ENCOUNTER — Encounter: Payer: Self-pay | Admitting: Family Medicine

## 2023-11-08 VITALS — BP 126/62 | HR 52 | Temp 97.7°F | Ht 65.0 in | Wt 164.0 lb

## 2023-11-08 DIAGNOSIS — I482 Chronic atrial fibrillation, unspecified: Secondary | ICD-10-CM | POA: Diagnosis not present

## 2023-11-08 DIAGNOSIS — I5031 Acute diastolic (congestive) heart failure: Secondary | ICD-10-CM | POA: Diagnosis not present

## 2023-11-08 NOTE — Progress Notes (Signed)
 Subjective:    Patient ID: Eric [name]., male    DOB: 1928/05/11, 89 y.o.   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  He was increased from his baseline Lasix of 40 mg a day to 60 mg a day x 3 days.  He states that he diuresed over 10 pounds.  However weight today is up according to our scales although this is wearing his prosthesis.  He is back to taking Lasix 40 mg a day.  He is also on atenolol one half of a 25 mg pill a day.  He states that at home his heart rate is typically in the 40s and occasionally gets into the high 30s.  He denies any syncope or near syncope.  Today on physical exam he is in normal sinus rhythm.  He denies any shortness of breath today.  He denies any orthopnea.  Past Medical History:  Diagnosis Date   Atrial flutter (HCC)    s/p RFCA   CAD (coronary artery disease)    cath 2003, occluded S-RCA, occluded S-Dx, L-LAD ok, s/p PTCA to LAD   Cataract    CKD (chronic kidney disease) stage 3, GFR 30-59 ml/min (HCC)    Diabetes mellitus    diet controlled   Hearing aid worn    bilateral   History of kidney stones    Long term (current) use of anticoagulants    Neuromuscular disorder (HCC)    OSA (obstructive sleep apnea) 12/11   very mild, AHI 7/hr   Persistent atrial fibrillation (HCC) 09/26/2015   Pure hyperglyceridemia    PVD (peripheral vascular disease) (HCC)    angioplasty of his right lower extremity in [place] by Dr.[name] 2013   Unspecified essential hypertension    Wears dentures    full upper and lower   Past Surgical History:  Procedure Laterality Date   ABDOMINAL AORTOGRAM W/LOWER EXTREMITY N/A 11/15/2017   Procedure: ABDOMINAL AORTOGRAM W/LOWER EXTREMITY;  Surgeon: Sherren Kerns, MD;  [facility];  Service: Cardiovascular;  Laterality: N/A;   Adenosine Myoview   3/06   EF 56%, neg. Ischemia   Adenosine Myoview  02/18/07   nml   AMPUTATION Left 12/15/2017   Procedure: AMPUTATION BELOW KNEE;  Surgeon: Annice Needy, MD;  Location: [facility];  Service: Vascular;  Laterality: Left;   ANGIOPLASTY  1/99   CAD- diogonal with rotational artherectomy   Arthrectomy     of LAD & PTCA   BLEPHAROPLASTY Bilateral    CARDIAC CATHETERIZATION  1/00   CARDIOVERSION  1/04   CARDIOVERSION  5/07   hospital- a flutter   CARPAL TUNNEL RELEASE     ? bilateral   CATARACT EXTRACTION W/PHACO Right 07/28/2017   Procedure: CATARACT EXTRACTION PHACO AND INTRAOCULAR LENS PLACEMENT (IOC) RIGHT DIABETIC;  Surgeon: Lockie Mola, MD;  Location: [facility];  Service: Ophthalmology;  Laterality: Right;  Diabetic - diet controlled   CATARACT EXTRACTION W/PHACO Left 08/18/2017   Procedure: CATARACT EXTRACTION PHACO AND INTRAOCULAR LENS PLACEMENT (IOC);  Surgeon: Lockie Mola, MD;  Location: [facility];  Service: Ophthalmology;  Laterality: Left;  DIABETES - oral meds   COLONOSCOPY W/ BIOPSIES  10/01/06   sigmoid polyp bx neg, 3 years   COLONOSCOPY WITH PROPOFOL N/A 04/28/2021   Procedure: COLONOSCOPY WITH PROPOFOL;  Surgeon: Leone Payor,  Maryjean Morn, MD;  Location: [facility] ENDOSCOPY;  Service: Endoscopy;  Laterality: N/A;   CORONARY ANGIOPLASTY  4/03   cutting balloon PTCA pLAD into Diag   CORONARY ARTERY BYPASS GRAFT  2000   LIMA-LAD, SVG-RCA, SVG-Diag; SVG-Diag & SVG-RCA occluded 2003   FRACTURE SURGERY     HAND SURGERY  08/27/09   R thumb procedure wit Scaphoid Gragt and screws, Dr Merlyn Lot   LOWER EXTREMITY ANGIOGRAPHY Right 01/25/2019   Procedure: LOWER EXTREMITY ANGIOGRAPHY;  Surgeon: Renford Dills, MD;  Location: [facility];  Service: Cardiovascular;  Laterality: Right;   NM MYOVIEW LTD  4/11   normal   PERIPHERAL VASCULAR CATHETERIZATION Right 06/27/2015   Procedure: Lower Extremity Angiography;  Surgeon: Annice Needy, MD;  Location: [facility]  CV [facility];  Service: Cardiovascular;  Laterality: Right;   PERIPHERAL VASCULAR CATHETERIZATION  06/27/2015   Procedure: Lower Extremity Intervention;  Surgeon: Annice Needy, MD;  Location: [facility];  Service: Cardiovascular;;   POLYPECTOMY  04/28/2021   Procedure: POLYPECTOMY;  Surgeon: Iva Boop, MD;  Location: [facility] ENDOSCOPY;  Service: Endoscopy;;   Current Outpatient Medications on File Prior to Visit  Medication Sig Dispense Refill   acetaminophen (TYLENOL) 325 MG tablet Take 2 tablets (650 mg total) by mouth every 6 (six) hours as needed for mild pain (or temp >/= 101 F).     amLODipine (NORVASC) 10 MG tablet Take 1 tablet by mouth once daily 90 tablet 1   atenolol (TENORMIN) 25 MG tablet Take 1/2 (one-half) tablet by mouth once daily 15 tablet 0   bethanechol (URECHOLINE) 10 MG tablet TAKE 1/2 (ONE-HALF) TABLET BY MOUTH THREE TIMES DAILY 135 tablet 0   cephALEXin (KEFLEX) 500 MG capsule Take 1 capsule (500 mg total) by mouth 2 (two) times daily for 10 days. 20 capsule 0   Cinnamon 500 MG TABS Take 500 mg by mouth daily.     Cranberry 500 MG CAPS Take 500 mg by mouth daily.     cyanocobalamin (VITAMIN B12) 500 MCG tablet Take 500 mcg by mouth daily.     ELIQUIS 2.5 MG TABS tablet TAKE 1 TABLET BY MOUTH TWICE DAILY (OFFICE  VISIT  NEEDED  FOR  ADDITIONAL  REFILLS) 180 tablet 0   ferrous sulfate 325 (65 FE) MG EC tablet Take 325 mg by mouth at bedtime.     finasteride (PROSCAR) 5 MG tablet TAKE 1 TABLET BY MOUTH ONCE DAILY 90 tablet 3   fluticasone (FLONASE) 50 MCG/ACT nasal spray Place 1 spray into both nostrils daily.  2   furosemide (LASIX) 40 MG tablet Take 1 tablet by mouth once daily 90 tablet 3   hydrALAZINE (APRESOLINE) 25 MG tablet TAKE 3 TABLETS BY MOUTH IN THE MORNING AND 3 AT BEDTIME 180 tablet 9   meclizine (ANTIVERT) 12.5 MG tablet Take 1 tablet (12.5 mg total) by mouth 3 (three) times daily as needed for dizziness. 30 tablet 0   Multiple Vitamin (DAILY MULTIVITAMIN  PO) Take 1 tablet by mouth daily.     ondansetron (ZOFRAN) 4 MG tablet Take 1 tablet (4 mg total) by mouth every 6 (six) hours. 12 tablet 0   pentoxifylline (TRENTAL) 400 MG CR tablet TAKE 1 TABLET BY MOUTH EVERY 12 HOURS 180 tablet 0   rosuvastatin (CRESTOR) 40 MG tablet Take 1 tablet by mouth once daily 90 tablet 1   tamsulosin (FLOMAX) 0.4 MG CAPS capsule TAKE 2 CAPSULES BY MOUTH ONCE DAILY AFTER SUPPER 60 capsule  0   No current facility-administered medications on file prior to visit.   Allergies  Allergen Reactions   Altace [Ramipril] Cough   Isosorbide Mononitrate Other (See Comments)    Unknown reaction   Quinidine Gluconate Rash   Social History   Socioeconomic History   Marital status: Married    Spouse name: [name]   Number of children: 5   Years of education: Not on file   Highest education level: Not on file  Occupational History   Occupation: [employer]    Employer: RETIRED  Tobacco Use   Smoking status: Never   Smokeless tobacco: Former    Quit date: 1994  Vaping Use   Vaping status: Never Used  Substance and Sexual Activity   Alcohol use: No   Drug use: No   Sexual activity: Never  Other Topics Concern   Not on file  Social History Narrative   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   5 kids   11 grandchildren.   20 great grandchilren.   Social Drivers of Corporate investment banker Strain: Low Risk  (08/12/2022)   Overall Financial Resource Strain (CARDIA)    Difficulty of Paying Living Expenses: Not hard at all  Food Insecurity: No Food Insecurity (03/08/2023)   Hunger Vital Sign    Worried About Running Out of Food in the Last Year: Never true    Ran Out of Food in the Last Year: Never true  Transportation Needs: No Transportation Needs (03/08/2023)   PRAPARE - Administrator, Civil Service (Medical): No    Lack of Transportation (Non-Medical): No  Physical Activity:  Insufficiently Active (08/12/2022)   Exercise Vital Sign    Days of Exercise per Week: 5 days    Minutes of Exercise per Session: 20 min  Stress: No Stress Concern Present (08/12/2022)   [employer] of Occupational Health - Occupational Stress Questionnaire    Feeling of Stress : Not at all  Social Connections: Socially Integrated (08/12/2022)   Social Connection and Isolation Panel [NHANES]    Frequency of Communication with Friends and Family: More than three times a week    Frequency of Social Gatherings with Friends and Family: More than three times a week    Attends Religious Services: More than 4 times per year    Active Member of [employer] or Organizations: Yes    Attends Banker Meetings: More than 4 times per year    Marital Status: Married  Catering manager Violence: Not At Risk (03/08/2023)   Humiliation, Afraid, Rape, and Kick questionnaire    Fear of Current or Ex-Partner: No    Emotionally Abused: No    Physically Abused: No    Sexually Abused: No      Review of Systems  All other systems reviewed and are negative.      Objective:   Physical Exam Vitals reviewed.  Constitutional:      Appearance: Normal appearance.  Cardiovascular:     Rate and Rhythm: Normal rate and regular rhythm.     Pulses: Normal pulses.     Heart sounds: Normal heart sounds.  Pulmonary:     Effort: Pulmonary effort is normal.     Breath sounds: Normal breath sounds. No wheezing, rhonchi or rales.  Abdominal:     General: Abdomen is flat.     Palpations: Abdomen is soft.  Musculoskeletal:     Right  lower leg: No edema.  Neurological:     Mental Status: He is alert.           Assessment & Plan:  Chronic atrial fibrillation (HCC) - Plan: CBC with Differential/Platelet, Basic Metabolic Panel Without GFR  Acute heart failure with preserved ejection fraction (HCC) Patient has heart failure with preserved EF.  Echocardiogram showed an ejection fraction of 60% with  grade 3 diastolic dysfunction in July 2024.  Recommended discontinuation of atenolol given his bradycardia.  That is the only change I would make at this point.  Continue Lasix 40 mg a day.  However I recommended the patient weigh himself daily and if he gains 3 pounds or more to take an extra half dose of Lasix.  Also recommended that he check his heart rate daily and if greater than 90 take his atenolol otherwise hold the medication.  If he continues to have trouble with fluid retention, consider adding spironolactone given his history of heart failure with preserved EF versus Jardiance

## 2023-11-09 DIAGNOSIS — R338 Other retention of urine: Secondary | ICD-10-CM | POA: Diagnosis not present

## 2023-11-09 DIAGNOSIS — N401 Enlarged prostate with lower urinary tract symptoms: Secondary | ICD-10-CM | POA: Diagnosis not present

## 2023-11-09 LAB — CBC WITH DIFFERENTIAL/PLATELET
Absolute Lymphocytes: 2683 {cells}/uL (ref 850–3900)
Absolute Monocytes: 634 {cells}/uL (ref 200–950)
Basophils Absolute: 79 {cells}/uL (ref 0–200)
Basophils Relative: 0.8 %
Eosinophils Absolute: 198 {cells}/uL (ref 15–500)
Eosinophils Relative: 2 %
HCT: 33.4 % — ABNORMAL LOW (ref 38.5–50.0)
Hemoglobin: 10.8 g/dL — ABNORMAL LOW (ref 13.2–17.1)
MCH: 29.7 pg (ref 27.0–33.0)
MCHC: 32.3 g/dL (ref 32.0–36.0)
MCV: 91.8 fL (ref 80.0–100.0)
MPV: 10.7 fL (ref 7.5–12.5)
Monocytes Relative: 6.4 %
Neutro Abs: 6306 {cells}/uL (ref 1500–7800)
Neutrophils Relative %: 63.7 %
Platelets: 176 10*3/uL (ref 140–400)
RBC: 3.64 10*6/uL — ABNORMAL LOW (ref 4.20–5.80)
RDW: 14 % (ref 11.0–15.0)
Total Lymphocyte: 27.1 %
WBC: 9.9 10*3/uL (ref 3.8–10.8)

## 2023-11-09 LAB — BASIC METABOLIC PANEL WITHOUT GFR
BUN/Creatinine Ratio: 19 (calc) (ref 6–22)
BUN: 27 mg/dL — ABNORMAL HIGH (ref 7–25)
CO2: 26 mmol/L (ref 20–32)
Calcium: 9.1 mg/dL (ref 8.6–10.3)
Chloride: 103 mmol/L (ref 98–110)
Creat: 1.43 mg/dL — ABNORMAL HIGH (ref 0.70–1.22)
Glucose, Bld: 96 mg/dL (ref 65–99)
Potassium: 4.1 mmol/L (ref 3.5–5.3)
Sodium: 137 mmol/L (ref 135–146)

## 2023-11-29 DIAGNOSIS — H353221 Exudative age-related macular degeneration, left eye, with active choroidal neovascularization: Secondary | ICD-10-CM | POA: Diagnosis not present

## 2023-12-09 ENCOUNTER — Encounter: Admitting: Family Medicine

## 2023-12-10 ENCOUNTER — Ambulatory Visit (INDEPENDENT_AMBULATORY_CARE_PROVIDER_SITE_OTHER): Admitting: Family Medicine

## 2023-12-10 ENCOUNTER — Encounter: Payer: Self-pay | Admitting: Family Medicine

## 2023-12-10 VITALS — BP 136/70 | HR 82 | Temp 97.9°F | Ht 65.0 in | Wt 159.6 lb

## 2023-12-10 DIAGNOSIS — R339 Retention of urine, unspecified: Secondary | ICD-10-CM

## 2023-12-10 DIAGNOSIS — L84 Corns and callosities: Secondary | ICD-10-CM

## 2023-12-10 DIAGNOSIS — E781 Pure hyperglyceridemia: Secondary | ICD-10-CM

## 2023-12-10 DIAGNOSIS — I10 Essential (primary) hypertension: Secondary | ICD-10-CM

## 2023-12-10 DIAGNOSIS — T7840XS Allergy, unspecified, sequela: Secondary | ICD-10-CM | POA: Diagnosis not present

## 2023-12-10 DIAGNOSIS — I4892 Unspecified atrial flutter: Secondary | ICD-10-CM

## 2023-12-10 DIAGNOSIS — I739 Peripheral vascular disease, unspecified: Secondary | ICD-10-CM

## 2023-12-10 DIAGNOSIS — E11628 Type 2 diabetes mellitus with other skin complications: Secondary | ICD-10-CM

## 2023-12-10 MED ORDER — FINASTERIDE 5 MG PO TABS: 5.0000 mg | ORAL_TABLET | Freq: Every day | ORAL | 3 refills | Status: AC

## 2023-12-10 MED ORDER — ROSUVASTATIN CALCIUM 40 MG PO TABS: 40.0000 mg | ORAL_TABLET | Freq: Every day | ORAL | 1 refills | Status: DC

## 2023-12-10 MED ORDER — PENTOXIFYLLINE ER 400 MG PO TBCR: 400.0000 mg | EXTENDED_RELEASE_TABLET | Freq: Two times a day (BID) | ORAL | 0 refills | Status: DC

## 2023-12-10 MED ORDER — FLUTICASONE PROPIONATE 50 MCG/ACT NA SUSP: 1.0000 | Freq: Every day | NASAL | 2 refills | Status: AC

## 2023-12-10 MED ORDER — BETHANECHOL CHLORIDE 10 MG PO TABS: 5.0000 mg | ORAL_TABLET | Freq: Three times a day (TID) | ORAL | 3 refills | Status: AC

## 2023-12-10 MED ORDER — TAMSULOSIN HCL 0.4 MG PO CAPS: 0.4000 mg | ORAL_CAPSULE | Freq: Every day | ORAL | 1 refills | Status: AC

## 2023-12-10 MED ORDER — APIXABAN 2.5 MG PO TABS: 2.5000 mg | ORAL_TABLET | Freq: Two times a day (BID) | ORAL | 3 refills | Status: DC

## 2023-12-10 MED ORDER — AMLODIPINE BESYLATE 10 MG PO TABS: 10.0000 mg | ORAL_TABLET | Freq: Every day | ORAL | 1 refills | Status: DC

## 2023-12-10 NOTE — Progress Notes (Signed)
 Subjective:    Patient ID: Eric Simper., male    DOB: 1927/08/12, 89 y.o.   MRN: [id number]  Medication Refill  Patient has a history of diet-controlled diabetes, coronary artery disease, chronic kidney disease stage III, peripheral vascular disease status post left BKA.  He is here today for a complete physical exam.  Today on exam, the patient appears to be in atrial fibrillation.  His heart rate is irregular.  He does have +1 pitting edema in his right leg distal to the knee.  However his lungs are clear and he denies any short breath.  He is appreciative On the Plantar Surface of His Fifth MPJ on His Left Foot.  I Debrided This Today for the Patient Using a Razor Blade.  He Has Diminished Sensation to 10 G Monofilament and Diminished Pulses in His Right Foot.  He Is Due for a Flu Shot and a Photographer in the Fall.  He Is Due for the RSV Vaccine Anytime.  Based on His Age He Does Not Require a Colonoscopy or Prostate Cancer Screening Past Medical History:  Diagnosis Date   Atrial flutter (HCC)    s/p RFCA   CAD (coronary artery disease)    cath 2003, occluded S-RCA, occluded S-Dx, L-LAD ok, s/p PTCA to LAD   Cataract    CKD (chronic kidney disease) stage 3, GFR 30-59 ml/min (HCC)    Diabetes mellitus    diet controlled   Hearing aid worn    bilateral   History of kidney stones    Long term (current) use of anticoagulants    Neuromuscular disorder (HCC)    OSA (obstructive sleep apnea) 12/11   very mild, AHI 7/hr   Persistent atrial fibrillation (HCC) 09/26/2015   Pure hyperglyceridemia    PVD (peripheral vascular disease) (HCC)    angioplasty of his right lower extremity in [place] by Dr.[name] 2013   Unspecified essential hypertension    Wears dentures    full upper and lower   Past Surgical History:  Procedure Laterality Date   ABDOMINAL AORTOGRAM W/LOWER EXTREMITY N/A 11/15/2017   Procedure: ABDOMINAL AORTOGRAM W/LOWER EXTREMITY;  Surgeon: Richrd Char, MD;   Location: [facility];  Service: Cardiovascular;  Laterality: N/A;   Adenosine Myoview   3/06   EF 56%, neg. Ischemia   Adenosine Myoview   02/18/07   nml   AMPUTATION Left 12/15/2017   Procedure: AMPUTATION BELOW KNEE;  Surgeon: Celso College, MD;  Location: [facility];  Service: Vascular;  Laterality: Left;   ANGIOPLASTY  1/99   CAD- diogonal with rotational artherectomy   Arthrectomy     of LAD & PTCA   BLEPHAROPLASTY Bilateral    CARDIAC CATHETERIZATION  1/00   CARDIOVERSION  1/04   CARDIOVERSION  5/07   hospital- a flutter   CARPAL TUNNEL RELEASE     ? bilateral   CATARACT EXTRACTION W/PHACO Right 07/28/2017   Procedure: CATARACT EXTRACTION PHACO AND INTRAOCULAR LENS PLACEMENT (IOC) RIGHT DIABETIC;  Surgeon: Annell Kidney, MD;  Location: [facility];  Service: Ophthalmology;  Laterality: Right;  Diabetic - diet controlled   CATARACT EXTRACTION W/PHACO Left 08/18/2017   Procedure: CATARACT EXTRACTION PHACO AND INTRAOCULAR LENS PLACEMENT (IOC);  Surgeon: Annell Kidney, MD;  Location: [name];  Service: Ophthalmology;  Laterality: Left;  DIABETES - oral meds   COLONOSCOPY W/ BIOPSIES  10/01/06   sigmoid polyp bx neg, 3 years   COLONOSCOPY WITH PROPOFOL  N/A 04/28/2021   Procedure:  COLONOSCOPY WITH PROPOFOL ;  Surgeon: Kenney Peacemaker, MD;  Location: [facility] ENDOSCOPY;  Service: Endoscopy;  Laterality: N/A;   CORONARY ANGIOPLASTY  4/03   cutting balloon PTCA pLAD into Diag   CORONARY ARTERY BYPASS GRAFT  2000   LIMA-LAD, SVG-RCA, SVG-Diag; SVG-Diag & SVG-RCA occluded 2003   FRACTURE SURGERY     HAND SURGERY  08/27/09   R thumb procedure wit Scaphoid Gragt and screws, Dr Huntley Mai   LOWER EXTREMITY ANGIOGRAPHY Right 01/25/2019   Procedure: LOWER EXTREMITY ANGIOGRAPHY;  Surgeon: Jackquelyn Mass, MD;  Location: [facility];  Service: Cardiovascular;  Laterality: Right;   NM MYOVIEW  LTD  4/11   normal   PERIPHERAL VASCULAR CATHETERIZATION Right 06/27/2015    Procedure: Lower Extremity Angiography;  Surgeon: Celso College, MD;  Location: [facility];  Service: Cardiovascular;  Laterality: Right;   PERIPHERAL VASCULAR CATHETERIZATION  06/27/2015   Procedure: Lower Extremity Intervention;  Surgeon: Celso College, MD;  Location: [facility];  Service: Cardiovascular;;   POLYPECTOMY  04/28/2021   Procedure: POLYPECTOMY;  Surgeon: Kenney Peacemaker, MD;  Location: [facility] ENDOSCOPY;  Service: Endoscopy;;   Current Outpatient Medications on File Prior to Visit  Medication Sig Dispense Refill   acetaminophen  (TYLENOL ) 325 MG tablet Take 2 tablets (650 mg total) by mouth every 6 (six) hours as needed for mild pain (or temp >/= 101 F).     Cinnamon  500 MG TABS Take 500 mg by mouth daily.     Cranberry 500 MG CAPS Take 500 mg by mouth daily.     cyanocobalamin  (VITAMIN B12) 500 MCG tablet Take 500 mcg by mouth daily.     ferrous sulfate  325 (65 FE) MG EC tablet Take 325 mg by mouth at bedtime.     furosemide  (LASIX ) 40 MG tablet Take 1 tablet by mouth once daily 90 tablet 3   hydrALAZINE  (APRESOLINE ) 25 MG tablet TAKE 3 TABLETS BY MOUTH IN THE MORNING AND 3 AT BEDTIME 180 tablet 9   meclizine  (ANTIVERT ) 12.5 MG tablet Take 1 tablet (12.5 mg total) by mouth 3 (three) times daily as needed for dizziness. 30 tablet 0   Multiple Vitamin (DAILY MULTIVITAMIN PO) Take 1 tablet by mouth daily.     ondansetron  (ZOFRAN ) 4 MG tablet Take 1 tablet (4 mg total) by mouth every 6 (six) hours. 12 tablet 0   No current facility-administered medications on file prior to visit.   Allergies  Allergen Reactions   Altace [Ramipril] Cough   Isosorbide Mononitrate Other (See Comments)    Unknown reaction   Quinidine Gluconate Rash   Social History   Socioeconomic History   Marital status: Married    Spouse name: [name]   Number of children: 5   Years of education: Not on file   Highest education level: Not on file  Occupational History   Occupation: [employer]    Employer: RETIRED  Tobacco Use   Smoking status: Never   Smokeless tobacco: Former    Quit date: 1994  Vaping Use   Vaping status: Never Used  Substance and Sexual Activity   Alcohol use: No   Drug use: No   Sexual activity: Never  Other Topics Concern   Not on file  Social History Narrative   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   5 kids   11 grandchildren.   20 great grandchilren.   Social  Drivers of Health   Financial Resource Strain: Low Risk  (08/12/2022)   Overall Financial Resource Strain (CARDIA)    Difficulty of Paying Living Expenses: Not hard at all  Food Insecurity: No Food Insecurity (03/08/2023)   Hunger Vital Sign    Worried About Running Out of Food in the Last Year: Never true    Ran Out of Food in the Last Year: Never true  Transportation Needs: No Transportation Needs (03/08/2023)   PRAPARE - Administrator, Civil Service (Medical): No    Lack of Transportation (Non-Medical): No  Physical Activity: Insufficiently Active (08/12/2022)   Exercise Vital Sign    Days of Exercise per Week: 5 days    Minutes of Exercise per Session: 20 min  Stress: No Stress Concern Present (08/12/2022)   [employer] of Occupational Health - Occupational Stress Questionnaire    Feeling of Stress : Not at all  Social Connections: Socially Integrated (08/12/2022)   Social Connection and Isolation Panel [NHANES]    Frequency of Communication with Friends and Family: More than three times a week    Frequency of Social Gatherings with Friends and Family: More than three times a week    Attends Religious Services: More than 4 times per year    Active Member of [employer] or Organizations: Yes    Attends Banker Meetings: More than 4 times per year    Marital Status: Married  Catering manager Violence: Not At Risk (03/08/2023)   Humiliation, Afraid, Rape, and Kick questionnaire    Fear of Current or  Ex-Partner: No    Emotionally Abused: No    Physically Abused: No    Sexually Abused: No      Review of Systems  All other systems reviewed and are negative.      Objective:   Physical Exam Vitals reviewed.  Constitutional:      General: He is not in acute distress.    Appearance: Normal appearance. He is normal weight. He is not ill-appearing or toxic-appearing.  HENT:     Head: Normocephalic and atraumatic.     Nose: No congestion or rhinorrhea.     Mouth/Throat:     Pharynx: Oropharynx is clear. No oropharyngeal exudate or posterior oropharyngeal erythema.  Eyes:     General:        Right eye: No discharge.        Left eye: No discharge.     Conjunctiva/sclera: Conjunctivae normal.  Neck:     Vascular: No carotid bruit.  Cardiovascular:     Rate and Rhythm: Normal rate. Rhythm irregular.     Pulses:          Dorsalis pedis pulses are 1+ on the right side.       Posterior tibial pulses are 1+ on the right side.     Heart sounds: Normal heart sounds.  Pulmonary:     Effort: Pulmonary effort is normal.     Breath sounds: Normal breath sounds. No wheezing, rhonchi or rales.  Abdominal:     General: Abdomen is flat. Bowel sounds are normal. There is no distension.     Palpations: Abdomen is soft.     Tenderness: There is no abdominal tenderness. There is no guarding or rebound.  Musculoskeletal:     Cervical back: Neck supple. No rigidity or tenderness.     Right lower leg: Edema present.       Feet:     Left Lower Extremity: Left leg  is amputated below knee.  Feet:     Right foot:     Skin integrity: Callus present.  Lymphadenopathy:     Cervical: No cervical adenopathy.  Skin:    Coloration: Skin is not jaundiced.     Findings: No bruising or rash.  Neurological:     General: No focal deficit present.     Mental Status: He is alert and oriented to person, place, and time. Mental status is at baseline.     Cranial Nerves: No cranial nerve deficit.      Sensory: Sensory deficit present.     Motor: No weakness.     Gait: Gait abnormal.  Psychiatric:        Mood and Affect: Mood normal.        Behavior: Behavior normal.        Thought Content: Thought content normal.        Judgment: Judgment normal.           Assessment & Plan:  Allergy, sequela - Plan: fluticasone  (FLONASE ) 50 MCG/ACT nasal spray  Essential hypertension - Plan: amLODipine  (NORVASC ) 10 MG tablet, pentoxifylline  (TRENTAL ) 400 MG CR tablet  Urinary retention - Plan: bethanechol  (URECHOLINE ) 10 MG tablet, finasteride  (PROSCAR ) 5 MG tablet, tamsulosin  (FLOMAX ) 0.4 MG CAPS capsule  Atrial flutter, unspecified type (HCC) - Plan: apixaban  (ELIQUIS ) 2.5 MG TABS tablet  PVD (peripheral vascular disease) (HCC) - Plan: pentoxifylline  (TRENTAL ) 400 MG CR tablet  Hypertriglyceridemia - Plan: rosuvastatin  (CRESTOR ) 40 MG tablet  Type 2 diabetes mellitus with pressure callus (HCC) - Plan: Hemoglobin A1c, CBC with Differential/Platelet, COMPLETE METABOLIC PANEL WITHOUT GFR, Lipid panel Patient's heart rate is well-controlled.  He is appropriately anticoagulated.  His blood pressure is good.  Check CBC CMP lipid panel and A1c.  Recommended a flu shot and a COVID shot in the fall.  Recommended RSV vaccine for now.  Goal hemoglobin A1c is less than 6.5.  Goal LDL cholesterol is less than 100.  I debrided the preulcerative callus on the plantar surface of the right foot.

## 2023-12-11 LAB — LIPID PANEL
Cholesterol: 109 mg/dL (ref <200)
HDL: 39 mg/dL — ABNORMAL LOW (ref >=40)
LDL Cholesterol (Calc): 49 mg/dL
Non-HDL Cholesterol (Calc): 70 mg/dL (ref <130)
Total CHOL/HDL Ratio: 2.8 (calc) (ref <5.0)
Triglycerides: 131 mg/dL (ref <150)

## 2023-12-11 LAB — COMPLETE METABOLIC PANEL WITHOUT GFR
AG Ratio: 1.3 (calc) (ref 1.0–2.5)
ALT: 28 U/L (ref 9–46)
AST: 36 U/L — ABNORMAL HIGH (ref 10–35)
Albumin: 3.8 g/dL (ref 3.6–5.1)
Alkaline phosphatase (APISO): 55 U/L (ref 35–144)
BUN/Creatinine Ratio: 17 (calc) (ref 6–22)
BUN: 23 mg/dL (ref 7–25)
CO2: 29 mmol/L (ref 20–32)
Calcium: 9.4 mg/dL (ref 8.6–10.3)
Chloride: 104 mmol/L (ref 98–110)
Creat: 1.38 mg/dL — ABNORMAL HIGH (ref 0.70–1.22)
Globulin: 3 g/dL (ref 1.9–3.7)
Glucose, Bld: 89 mg/dL (ref 65–99)
Potassium: 3.8 mmol/L (ref 3.5–5.3)
Sodium: 139 mmol/L (ref 135–146)
Total Bilirubin: 0.6 mg/dL (ref 0.2–1.2)
Total Protein: 6.8 g/dL (ref 6.1–8.1)

## 2023-12-11 LAB — CBC WITH DIFFERENTIAL/PLATELET
Absolute Lymphocytes: 3533 {cells}/uL (ref 850–3900)
Absolute Monocytes: 614 {cells}/uL (ref 200–950)
Basophils Absolute: 86 {cells}/uL (ref 0–200)
Basophils Relative: 0.9 %
Eosinophils Absolute: 182 {cells}/uL (ref 15–500)
Eosinophils Relative: 1.9 %
HCT: 36.8 % — ABNORMAL LOW (ref 38.5–50.0)
Hemoglobin: 11.9 g/dL — ABNORMAL LOW (ref 13.2–17.1)
MCH: 29.7 pg (ref 27.0–33.0)
MCHC: 32.3 g/dL (ref 32.0–36.0)
MCV: 91.8 fL (ref 80.0–100.0)
MPV: 10.7 fL (ref 7.5–12.5)
Monocytes Relative: 6.4 %
Neutro Abs: 5184 {cells}/uL (ref 1500–7800)
Neutrophils Relative %: 54 %
Platelets: 171 10*3/uL (ref 140–400)
RBC: 4.01 10*6/uL — ABNORMAL LOW (ref 4.20–5.80)
RDW: 14.1 % (ref 11.0–15.0)
Total Lymphocyte: 36.8 %
WBC: 9.6 10*3/uL (ref 3.8–10.8)

## 2023-12-11 LAB — HEMOGLOBIN A1C
Hgb A1c MFr Bld: 5.8 % — ABNORMAL HIGH (ref <5.7)
Mean Plasma Glucose: 120 mg/dL
eAG (mmol/L): 6.6 mmol/L

## 2023-12-27 DIAGNOSIS — H353221 Exudative age-related macular degeneration, left eye, with active choroidal neovascularization: Secondary | ICD-10-CM | POA: Diagnosis not present

## 2024-01-11 ENCOUNTER — Encounter: Payer: Self-pay | Admitting: Podiatry

## 2024-01-11 ENCOUNTER — Ambulatory Visit (INDEPENDENT_AMBULATORY_CARE_PROVIDER_SITE_OTHER)

## 2024-01-11 ENCOUNTER — Ambulatory Visit: Admitting: Podiatry

## 2024-01-11 VITALS — Ht 65.0 in | Wt 159.6 lb

## 2024-01-11 DIAGNOSIS — M7671 Peroneal tendinitis, right leg: Secondary | ICD-10-CM

## 2024-01-11 DIAGNOSIS — L989 Disorder of the skin and subcutaneous tissue, unspecified: Secondary | ICD-10-CM

## 2024-01-11 MED ORDER — BETAMETHASONE SOD PHOS & ACET 6 (3-3) MG/ML IJ SUSP
3.0000 mg | Freq: Once | INTRAMUSCULAR | Status: AC
  Administered 2024-01-11: 3 mg via INTRA_ARTICULAR

## 2024-01-11 NOTE — Progress Notes (Signed)
 Chief Complaint  Patient presents with   Foot Pain    Pt is here due to right foot pain that has been going on for a few week the side of foot is painful to touch, no prior injury.    HPI: 89 y.o. male presenting today with his son for evaluation of pain and tenderness to the lateral column of the right foot x 2 weeks.  Idiopathic acute onset.  No history of injury.  Very sensitive with weightbearing and to touch.  He currently wears diabetic shoes with custom molded Plastizote insoles  Past Medical History:  Diagnosis Date   Atrial flutter (HCC)    s/p RFCA   CAD (coronary artery disease)    cath 2003, occluded S-RCA, occluded S-Dx, L-LAD ok, s/p PTCA to LAD   Cataract    CKD (chronic kidney disease) stage 3, GFR 30-59 ml/min (HCC)    Diabetes mellitus    diet controlled   Hearing aid worn    bilateral   History of kidney stones    Long term (current) use of anticoagulants    Neuromuscular disorder (HCC)    OSA (obstructive sleep apnea) 12/11   very mild, AHI 7/hr   Persistent atrial fibrillation (HCC) 09/26/2015   Pure hyperglyceridemia    PVD (peripheral vascular disease) (HCC)    angioplasty of his right lower extremity in [place] by Dr.[name] 2013   Unspecified essential hypertension    Wears dentures    full upper and lower    Past Surgical History:  Procedure Laterality Date   ABDOMINAL AORTOGRAM W/LOWER EXTREMITY N/A 11/15/2017   Procedure: ABDOMINAL AORTOGRAM W/LOWER EXTREMITY;  Surgeon: Richrd Char, MD;  [facility];  Service: Cardiovascular;  Laterality: N/A;   Adenosine Myoview   3/06   EF 56%, neg. Ischemia   Adenosine Myoview   02/18/07   nml   AMPUTATION Left 12/15/2017   Procedure: AMPUTATION BELOW KNEE;  Surgeon: Celso College, MD;  Location: [facility] ORS;  Service: Vascular;  Laterality: Left;   ANGIOPLASTY  1/99   CAD- diogonal with rotational artherectomy   Arthrectomy     of LAD & PTCA   BLEPHAROPLASTY Bilateral    CARDIAC  CATHETERIZATION  1/00   CARDIOVERSION  1/04   CARDIOVERSION  5/07   hospital- a flutter   CARPAL TUNNEL RELEASE     ? bilateral   CATARACT EXTRACTION W/PHACO Right 07/28/2017   Procedure: CATARACT EXTRACTION PHACO AND INTRAOCULAR LENS PLACEMENT (IOC) RIGHT DIABETIC;  Surgeon: Annell Kidney, MD;  Location: [facility];  Service: Ophthalmology;  Laterality: Right;  Diabetic - diet controlled   CATARACT EXTRACTION W/PHACO Left 08/18/2017   Procedure: CATARACT EXTRACTION PHACO AND INTRAOCULAR LENS PLACEMENT (IOC);  Surgeon: Annell Kidney, MD;  Location: [facility];  Service: Ophthalmology;  Laterality: Left;  DIABETES - oral meds   COLONOSCOPY W/ BIOPSIES  10/01/06   sigmoid polyp bx neg, 3 years   COLONOSCOPY WITH PROPOFOL  N/A 04/28/2021   Procedure: COLONOSCOPY WITH PROPOFOL ;  Surgeon: Kenney Peacemaker, MD;  Location: [facility] ENDOSCOPY;  Service: Endoscopy;  Laterality: N/A;   CORONARY ANGIOPLASTY  4/03   cutting balloon PTCA pLAD into Diag   CORONARY ARTERY BYPASS GRAFT  2000   LIMA-LAD, SVG-RCA, SVG-Diag; SVG-Diag & SVG-RCA occluded 2003   FRACTURE SURGERY     HAND SURGERY  08/27/09   R thumb procedure wit Scaphoid Gragt and screws, Dr Huntley Mai   LOWER EXTREMITY ANGIOGRAPHY Right 01/25/2019   Procedure: LOWER  EXTREMITY ANGIOGRAPHY;  Surgeon: Jackquelyn Mass, MD;  Location: [facility];  Service: Cardiovascular;  Laterality: Right;   NM MYOVIEW  LTD  4/11   normal   PERIPHERAL VASCULAR CATHETERIZATION Right 06/27/2015   Procedure: Lower Extremity Angiography;  Surgeon: Celso College, MD;  Location: [facility];  Service: Cardiovascular;  Laterality: Right;   PERIPHERAL VASCULAR CATHETERIZATION  06/27/2015   Procedure: Lower Extremity Intervention;  Surgeon: Celso College, MD;  Location: [facility];  Service: Cardiovascular;;   POLYPECTOMY  04/28/2021   Procedure: POLYPECTOMY;  Surgeon: Kenney Peacemaker, MD;  Location: [facility] ENDOSCOPY;  Service:  Endoscopy;;    Allergies  Allergen Reactions   Altace [Ramipril] Cough   Isosorbide Mononitrate Other (See Comments)    Unknown reaction   Quinidine Gluconate Rash     Physical Exam: General: The patient is alert and oriented x3 in no acute distress.  Dermatology: Skin is warm, dry and supple bilateral lower extremities.   Vascular: Palpable pedal pulses bilaterally. Capillary refill within normal limits.  No appreciable edema.  No erythema.  Neurological: Grossly intact via light touch  Musculoskeletal Exam: History of BKA LLE.  Tenderness with palpation noted at the fifth metatarsal tubercle of the right foot at the insertion of the peroneal tendon  Radiographic Exam RT foot 01/11/2024:  Normal osseous mineralization. Joint spaces preserved.  No fractures or osseous irregularities noted.  Assessment/Plan of Care: 1.  Insertional peroneal tendinitis right foot  -Patient evaluated.  X-rays reviewed -Injection of 0.5 cc Celestone Soluspan injected into the fifth metatarsal tubercle region right foot -Refrain from going barefoot.  Continue diabetic shoes with Plastizote insoles -Return to clinic PRN       Dot Gazella, DPM [facility]  Dr. Dot Gazella, DPM    [address] [place], [place] [address]                Office [phone]  Fax [phone]

## 2024-02-07 DIAGNOSIS — H353221 Exudative age-related macular degeneration, left eye, with active choroidal neovascularization: Secondary | ICD-10-CM | POA: Diagnosis not present

## 2024-03-06 ENCOUNTER — Other Ambulatory Visit: Payer: Self-pay | Admitting: Internal Medicine

## 2024-03-06 DIAGNOSIS — R3914 Feeling of incomplete bladder emptying: Secondary | ICD-10-CM | POA: Diagnosis not present

## 2024-03-06 DIAGNOSIS — N401 Enlarged prostate with lower urinary tract symptoms: Secondary | ICD-10-CM | POA: Diagnosis not present

## 2024-03-06 DIAGNOSIS — N3020 Other chronic cystitis without hematuria: Secondary | ICD-10-CM | POA: Diagnosis not present

## 2024-03-20 DIAGNOSIS — H353221 Exudative age-related macular degeneration, left eye, with active choroidal neovascularization: Secondary | ICD-10-CM | POA: Diagnosis not present

## 2024-03-20 DIAGNOSIS — H3322 Serous retinal detachment, left eye: Secondary | ICD-10-CM | POA: Diagnosis not present

## 2024-03-21 DIAGNOSIS — H338 Other retinal detachments: Secondary | ICD-10-CM | POA: Diagnosis not present

## 2024-03-21 DIAGNOSIS — Z961 Presence of intraocular lens: Secondary | ICD-10-CM | POA: Diagnosis not present

## 2024-03-21 DIAGNOSIS — H353110 Nonexudative age-related macular degeneration, right eye, stage unspecified: Secondary | ICD-10-CM | POA: Diagnosis not present

## 2024-03-21 DIAGNOSIS — H353220 Exudative age-related macular degeneration, left eye, stage unspecified: Secondary | ICD-10-CM | POA: Diagnosis not present

## 2024-03-21 LAB — HM DIABETES EYE EXAM

## 2024-03-27 DIAGNOSIS — E1122 Type 2 diabetes mellitus with diabetic chronic kidney disease: Secondary | ICD-10-CM | POA: Diagnosis not present

## 2024-03-27 DIAGNOSIS — Z79899 Other long term (current) drug therapy: Secondary | ICD-10-CM | POA: Diagnosis not present

## 2024-03-27 DIAGNOSIS — G4733 Obstructive sleep apnea (adult) (pediatric): Secondary | ICD-10-CM | POA: Diagnosis not present

## 2024-03-27 DIAGNOSIS — Z961 Presence of intraocular lens: Secondary | ICD-10-CM | POA: Diagnosis not present

## 2024-03-27 DIAGNOSIS — I2510 Atherosclerotic heart disease of native coronary artery without angina pectoris: Secondary | ICD-10-CM | POA: Diagnosis not present

## 2024-03-27 DIAGNOSIS — H3322 Serous retinal detachment, left eye: Secondary | ICD-10-CM | POA: Diagnosis not present

## 2024-03-27 DIAGNOSIS — N183 Chronic kidney disease, stage 3 unspecified: Secondary | ICD-10-CM | POA: Diagnosis not present

## 2024-03-27 DIAGNOSIS — H538 Other visual disturbances: Secondary | ICD-10-CM | POA: Diagnosis not present

## 2024-03-27 DIAGNOSIS — H33002 Unspecified retinal detachment with retinal break, left eye: Secondary | ICD-10-CM | POA: Diagnosis not present

## 2024-03-27 DIAGNOSIS — Z7901 Long term (current) use of anticoagulants: Secondary | ICD-10-CM | POA: Diagnosis not present

## 2024-03-27 DIAGNOSIS — N189 Chronic kidney disease, unspecified: Secondary | ICD-10-CM | POA: Diagnosis not present

## 2024-03-27 DIAGNOSIS — I129 Hypertensive chronic kidney disease with stage 1 through stage 4 chronic kidney disease, or unspecified chronic kidney disease: Secondary | ICD-10-CM | POA: Diagnosis not present

## 2024-04-03 ENCOUNTER — Other Ambulatory Visit: Payer: Self-pay | Admitting: Internal Medicine

## 2024-05-01 ENCOUNTER — Other Ambulatory Visit: Payer: Self-pay | Admitting: Internal Medicine

## 2024-05-09 ENCOUNTER — Other Ambulatory Visit: Payer: Self-pay | Admitting: Internal Medicine

## 2024-05-11 ENCOUNTER — Other Ambulatory Visit: Payer: Self-pay | Admitting: Internal Medicine

## 2024-05-16 DIAGNOSIS — H33002 Unspecified retinal detachment with retinal break, left eye: Secondary | ICD-10-CM | POA: Diagnosis not present

## 2024-05-16 NOTE — Progress Notes (Unsigned)
 Cardiology Office Note:    Date:  05/17/2024   ID:  Eric [name]., DOB 1927/09/09, MRN [id number]  PCP:  Duanne Butler DASEN, MD   [facility] Providers Cardiologist:  Vina Gull, MD     Referring MD: Duanne Butler DASEN, MD   Chief complaint: 43-month follow-up  History of Present Illness:    Eric [name]. is a 89 y.o. male with a hx of CAD s/p CABG x 3 (LIMA-LAD, SVG-diagonal, SVG-PDA) in 2000, PTCA-LAD in 2003, permanent atrial fibrillation, PAD s/p L BKA, hypertension, hyperlipidemia, CKD stage III, and type 2 diabetes who presents for follow-up related to CAD and atrial fibrillation.   Previously admitted in 02/27/2023 for right-sided weakness, found to have severe cervical stenosis, no CVA, not a candidate for surgery.  Echo 03/08/2023: LVEF 55-60%, normal LV function, no RWMA, moderate concentric LVH, G3 DD (restrictive).  Moderately enlarged RV.  Mildly elevated PASP.  RVSP 42 mmHg.  LA and RA severely dilated.  Trivial MV regurg.  Moderate mitral annular calcification.  Tricuspid regurg moderate.  Severe calcification of the aortic valve, moderate thickening of aortic valve, aortic valve regurg mild.  Aortic valve mean gradient measuring 9 mmHg.  RA pressure 3 mmHg.  Last evaluated by Dr. Gull in 03/30/2023, was stable from a cardiovascular standpoint at that time.   Patient presents to the clinic today with his son, appears to be doing well from a cardiovascular standpoint. He denies chest pain, palpitations, dyspnea, orthopnea, n, v,  dark/tarry/bloody stools, hematuria, dizziness, syncope, weight gain, edema. BPs at home average in the 130s-140s as he checks a couple times a day, HR 55-60s. Uses a walker and cane, does exercises daily from his walker.    Past Medical History:  Diagnosis Date   Atrial flutter (HCC)    s/p RFCA   CAD (coronary artery disease)    cath 2003, occluded S-RCA, occluded S-Dx, L-LAD ok, s/p PTCA to LAD   Cataract    CKD (chronic  kidney disease) stage 3, GFR 30-59 ml/min (HCC)    Diabetes mellitus    diet controlled   Hearing aid worn    bilateral   History of kidney stones    Long term (current) use of anticoagulants    Neuromuscular disorder (HCC)    OSA (obstructive sleep apnea) 12/11   very mild, AHI 7/hr   Persistent atrial fibrillation (HCC) 09/26/2015   Pure hyperglyceridemia    PVD (peripheral vascular disease)    angioplasty of his right lower extremity in [place] by Dr.[name] 2013   Unspecified essential hypertension    Wears dentures    full upper and lower    Past Surgical History:  Procedure Laterality Date   ABDOMINAL AORTOGRAM W/LOWER EXTREMITY N/A 11/15/2017   Procedure: ABDOMINAL AORTOGRAM W/LOWER EXTREMITY;  Surgeon: Harvey Carlin BRAVO, MD;  [facility];  Service: Cardiovascular;  Laterality: N/A;   Adenosine Myoview   3/06   EF 56%, neg. Ischemia   Adenosine Myoview   02/18/07   nml   AMPUTATION Left 12/15/2017   Procedure: AMPUTATION BELOW KNEE;  Surgeon: Marea Selinda RAMAN, MD;  Location: [facility];  Service: Vascular;  Laterality: Left;   ANGIOPLASTY  1/99   CAD- diogonal with rotational artherectomy   Arthrectomy     of LAD & PTCA   BLEPHAROPLASTY Bilateral    CARDIAC CATHETERIZATION  1/00   CARDIOVERSION  1/04   CARDIOVERSION  5/07   hospital- a flutter   CARPAL TUNNEL  RELEASE     ? bilateral   CATARACT EXTRACTION W/PHACO Right 07/28/2017   Procedure: CATARACT EXTRACTION PHACO AND INTRAOCULAR LENS PLACEMENT (IOC) RIGHT DIABETIC;  Surgeon: Mittie Gaskin, MD;  Location: [facility];  Service: Ophthalmology;  Laterality: Right;  Diabetic - diet controlled   CATARACT EXTRACTION W/PHACO Left 08/18/2017   Procedure: CATARACT EXTRACTION PHACO AND INTRAOCULAR LENS PLACEMENT (IOC);  Surgeon: Mittie Gaskin, MD;  Location: [facility];  Service: Ophthalmology;  Laterality: Left;  DIABETES - oral meds   COLONOSCOPY W/ BIOPSIES  10/01/06   sigmoid polyp bx  neg, 3 years   COLONOSCOPY WITH PROPOFOL  N/A 04/28/2021   Procedure: COLONOSCOPY WITH PROPOFOL ;  Surgeon: Avram Lupita BRAVO, MD;  Location: [facility] ENDOSCOPY;  Service: Endoscopy;  Laterality: N/A;   CORONARY ANGIOPLASTY  4/03   cutting balloon PTCA pLAD into Diag   CORONARY ARTERY BYPASS GRAFT  2000   LIMA-LAD, SVG-RCA, SVG-Diag; SVG-Diag & SVG-RCA occluded 2003   FRACTURE SURGERY     HAND SURGERY  08/27/09   R thumb procedure wit Scaphoid Gragt and screws, Dr Murrell   LOWER EXTREMITY ANGIOGRAPHY Right 01/25/2019   Procedure: LOWER EXTREMITY ANGIOGRAPHY;  Surgeon: Jama Cordella MATSU, MD;  Location: [facility];  Service: Cardiovascular;  Laterality: Right;   NM MYOVIEW  LTD  4/11   normal   PERIPHERAL VASCULAR CATHETERIZATION Right 06/27/2015   Procedure: Lower Extremity Angiography;  Surgeon: Selinda GORMAN Gu, MD;  Location: [facility];  Service: Cardiovascular;  Laterality: Right;   PERIPHERAL VASCULAR CATHETERIZATION  06/27/2015   Procedure: Lower Extremity Intervention;  Surgeon: Selinda GORMAN Gu, MD;  Location: [facility];  Service: Cardiovascular;;   POLYPECTOMY  04/28/2021   Procedure: POLYPECTOMY;  Surgeon: Avram Lupita BRAVO, MD;  Location: [facility] ENDOSCOPY;  Service: Endoscopy;;    Current Medications: Current Meds  Medication Sig   acetaminophen  (TYLENOL ) 325 MG tablet Take 2 tablets (650 mg total) by mouth every 6 (six) hours as needed for mild pain (or temp >/= 101 F).   amLODipine  (NORVASC ) 10 MG tablet Take 1 tablet (10 mg total) by mouth daily.   apixaban  (ELIQUIS ) 2.5 MG TABS tablet Take 1 tablet (2.5 mg total) by mouth 2 (two) times daily.   bethanechol  (URECHOLINE ) 10 MG tablet Take 0.5 tablets (5 mg total) by mouth 3 (three) times daily.   Cinnamon  500 MG TABS Take 500 mg by mouth daily.   Cranberry 500 MG CAPS Take 500 mg by mouth daily.   cyanocobalamin  (VITAMIN B12) 500 MCG tablet Take 500 mcg by mouth daily.   ferrous sulfate  325 (65 FE) MG EC tablet Take 325  mg by mouth at bedtime.   finasteride  (PROSCAR ) 5 MG tablet Take 1 tablet (5 mg total) by mouth daily.   fluticasone  (FLONASE ) 50 MCG/ACT nasal spray Place 1 spray into both nostrils daily.   furosemide  (LASIX ) 40 MG tablet Take 1 tablet by mouth once daily   meclizine  (ANTIVERT ) 12.5 MG tablet Take 1 tablet (12.5 mg total) by mouth 3 (three) times daily as needed for dizziness.   Multiple Vitamin (DAILY MULTIVITAMIN PO) Take 1 tablet by mouth daily.   ondansetron  (ZOFRAN ) 4 MG tablet Take 1 tablet (4 mg total) by mouth every 6 (six) hours.   pentoxifylline  (TRENTAL ) 400 MG CR tablet Take 1 tablet (400 mg total) by mouth every 12 (twelve) hours.   rosuvastatin  (CRESTOR ) 40 MG tablet Take 1 tablet (40 mg total) by mouth daily.   tamsulosin  (FLOMAX )  0.4 MG CAPS capsule Take 1 capsule (0.4 mg total) by mouth daily after supper.   [DISCONTINUED] hydrALAZINE  (APRESOLINE ) 25 MG tablet TAKE 3 TABLETS BY MOUTH IN THE MORNING AND 3 TABLETS AT BEDTIME     Allergies:   Altace [ramipril], Isosorbide mononitrate, and Quinidine gluconate   Social History   Socioeconomic History   Marital status: Married    Spouse name: Management consultant   Number of children: 5   Years of education: Not on file   Highest education level: Not on file  Occupational History   Occupation: [employer]    Employer: RETIRED  Tobacco Use   Smoking status: Never   Smokeless tobacco: Former    Quit date: 1994  Vaping Use   Vaping status: Never Used  Substance and Sexual Activity   Alcohol use: No   Drug use: No   Sexual activity: Never  Other Topics Concern   Not on file  Social History Narrative   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   5 kids   11 grandchildren.   20 great grandchilren.   Social Drivers of Corporate investment banker Strain: Low Risk  (08/12/2022)   Overall Financial Resource Strain (CARDIA)    Difficulty of Paying Living Expenses: Not hard at all   Food Insecurity: No Food Insecurity (03/08/2023)   Hunger Vital Sign    Worried About Running Out of Food in the Last Year: Never true    Ran Out of Food in the Last Year: Never true  Transportation Needs: No Transportation Needs (03/08/2023)   PRAPARE - Administrator, Civil Service (Medical): No    Lack of Transportation (Non-Medical): No  Physical Activity: Insufficiently Active (08/12/2022)   Exercise Vital Sign    Days of Exercise per Week: 5 days    Minutes of Exercise per Session: 20 min  Stress: No Stress Concern Present (08/12/2022)   [employer] of Occupational Health - Occupational Stress Questionnaire    Feeling of Stress : Not at all  Social Connections: Socially Integrated (08/12/2022)   Social Connection and Isolation Panel    Frequency of Communication with Friends and Family: More than three times a week    Frequency of Social Gatherings with Friends and Family: More than three times a week    Attends Religious Services: More than 4 times per year    Active Member of [employer] or Organizations: Yes    Attends Engineer, structural: More than 4 times per year    Marital Status: Married     Family History: The patient's family history includes Aneurysm in his father; Breast cancer in his sister; Hypertension in his father and mother; Liver cancer in his brother; Lung cancer in his brother and brother; Other in his brother and sister; Prostate cancer in his brother; Stroke in his father; Thrombosis in his brother.  ROS:   Please see the history of present illness.    All other systems reviewed and are negative.  EKGs/Labs/Other Studies Reviewed:    The following studies were reviewed today:  EKG Interpretation Date/Time:  Wednesday May 17 2024 11:49:07 EDT Ventricular Rate:  66 PR Interval:    QRS Duration:  88 QT Interval:  402 QTC Calculation: 421 R Axis:   17  Text Interpretation: Atrial fibrillation Nonspecific T wave abnormality No  significant change since prior study When compared with ECG of 28-Oct-2023 15:16, Current undetermined  rhythm precludes rhythm comparison, needs review Confirmed by Tishina Lown [phone]) on 05/17/2024 12:03:52 PM    Recent Labs: 10/28/2023: B Natriuretic Peptide 436.2 12/10/2023: ALT 28; BUN 23; Creat 1.38; Hemoglobin 11.9; Platelets 171; Potassium 3.8; Sodium 139  Recent Lipid Panel    Component Value Date/Time   CHOL 109 12/10/2023 0942   CHOL 83 (L) 12/09/2020 1140   TRIG 131 12/10/2023 0942   HDL 39 (L) 12/10/2023 0942   HDL 30 (L) 12/09/2020 1140   CHOLHDL 2.8 12/10/2023 0942   VLDL 16 03/08/2023 1049   LDLCALC 49 12/10/2023 0942   LDLDIRECT 99.1 02/19/2011 0823     Risk Assessment/Calculations:    CHA2DS2-VASc Score = 5   This indicates a 7.2% annual risk of stroke. The patient's score is based upon: CHF History: 0 HTN History: 1 Diabetes History: 1 Stroke History: 0 Vascular Disease History: 1 Age Score: 2 Gender Score: 0           Physical Exam:    VS:  BP (!) 144/66   Pulse 66   Ht 5' 5 (1.651 m)   Wt 154 lb (69.9 kg)   SpO2 98%   BMI 25.63 kg/m     Wt Readings from Last 3 Encounters:  05/17/24 154 lb (69.9 kg)  01/11/24 159 lb 9.6 oz (72.4 kg)  12/10/23 159 lb 9.6 oz (72.4 kg)     GEN:  Well nourished, well developed in no acute distress HEENT: Normal NECK:  No carotid bruits CARDIAC: RRR, no murmurs, rubs, gallops RESPIRATORY:  Clear to auscultation without rales, wheezing or rhonchi  MUSCULOSKELETAL:  1+ nonpitting edema to RLE, LBKA; No deformity  SKIN: Warm and dry NEUROLOGIC:  Alert and oriented x 3 PSYCHIATRIC:  Normal affect   ASSESSMENT:    1. Coronary artery disease involving native coronary artery of native heart, unspecified whether angina present   2. S/P CABG x 3   3. Primary hypertension   4. Permanent atrial fibrillation (HCC)   5. Hyperlipidemia, unspecified hyperlipidemia type   6. PAD (peripheral artery disease)   7.  Essential hypertension    PLAN:    In order of problems listed above:  CAD s/p CABG X3 in 2000, PCI 2003 EKG: Atrial fibrillation, nonspecific T wave abnormality, 66 bpm Denies CP, SOB, dizziness, edema Continue Crestor  40 mg daily Continue amlodipine  10 mg daily Continue Lasix  40 mg daily Not on aspirin  secondary to Eliquis  for PAF and advanced age Order BMP to evaluate potassium while on Lasix  Order CBC for eliquis  monitoring  HTN with lower extremity edema Has 1+ RLE edema previously documented in prior notes as well, patient is unconcerned with this BPs well controlled at home Continue amlodipine  10 mg daily -> if he requires blood pressure medicine reduction in the future, this would probably be the best one to start with given his edema Continue Lasix  40 mg daily Continue hydralazine  75 mg twice daily - refilled today  Permanent atrial fibrillation EKG as above, patient is in Atrial fibrillation Patient meets criteria for increased Eliquis  dosing to 5 mg twice daily, as this is the correct weight for his age (84), weight (72.4 kg), and creatinine (1.3). However, last BMET was in 12/2023. We will recheck today. If creatinine is still <1.5, will reach out to primary cardiologist Dr. Vina Gull to discuss dose adjustment.  HLD, LDL goal <55 Lipid panel 12/10/2023: Cholesterol 109, HDL 39, LDL 49, triglycerides 130 CMP 12/10/2023: AST 36, ALT 28 Continue Crestor  40 mg  daily  PAD Followed at [facility] Continue Crestor  40 mg daily   Follow up in 6 months      Medication Adjustments/Labs and Tests Ordered: Current medicines are reviewed at length with the patient today.  Concerns regarding medicines are outlined above.  Orders Placed This Encounter  Procedures   Basic metabolic panel with GFR   CBC   EKG 12-Lead   Meds ordered this encounter  Medications   hydrALAZINE  (APRESOLINE ) 25 MG tablet    Sig: Take 3 tablets (75 mg total) by mouth in the morning and at bedtime.     Dispense:  540 tablet    Refill:  1    Supervising Provider:   ELMIRA NEWMAN PARAS [[id number]]    Patient Instructions  Medication Instructions:  No changes   *If you need a refill on your cardiac medications before your next appointment, please call your pharmacy*   Lab Work: BMET CBC If you have labs (blood work) drawn today and your tests are completely normal, you will receive your results only by: MyChart Message (if you have MyChart) OR A paper copy in the mail If you have any lab test that is abnormal or we need to change your treatment, we will call you to review the results.   Testing/Procedures: Not needed   Follow-Up: At [facility], you and your health needs are our priority.  As part of our continuing mission to provide you with exceptional heart care, we have created designated Provider Care Teams.  These Care Teams include your primary Cardiologist (physician) and Advanced Practice Providers (APPs -  Physician Assistants and Nurse Practitioners) who all work together to provide you with the care you need, when you need it.     Your next appointment:   6 month(s)  The format for your next appointment:   In Person  Provider:   Vina Gull, MD     Signed, Miriam FORBES Shams, NP  05/17/2024 5:45 PM     [facility]

## 2024-05-17 ENCOUNTER — Encounter: Payer: Self-pay | Admitting: Emergency Medicine

## 2024-05-17 ENCOUNTER — Ambulatory Visit: Attending: Cardiology | Admitting: Emergency Medicine

## 2024-05-17 VITALS — BP 144/66 | HR 66 | Ht 65.0 in | Wt 154.0 lb

## 2024-05-17 DIAGNOSIS — I4892 Unspecified atrial flutter: Secondary | ICD-10-CM | POA: Diagnosis not present

## 2024-05-17 DIAGNOSIS — I2510 Atherosclerotic heart disease of native coronary artery without angina pectoris: Secondary | ICD-10-CM | POA: Diagnosis not present

## 2024-05-17 DIAGNOSIS — I4821 Permanent atrial fibrillation: Secondary | ICD-10-CM

## 2024-05-17 DIAGNOSIS — E785 Hyperlipidemia, unspecified: Secondary | ICD-10-CM

## 2024-05-17 DIAGNOSIS — Z951 Presence of aortocoronary bypass graft: Secondary | ICD-10-CM

## 2024-05-17 DIAGNOSIS — I10 Essential (primary) hypertension: Secondary | ICD-10-CM

## 2024-05-17 DIAGNOSIS — I739 Peripheral vascular disease, unspecified: Secondary | ICD-10-CM | POA: Diagnosis not present

## 2024-05-17 MED ORDER — HYDRALAZINE HCL 25 MG PO TABS: 75.0000 mg | ORAL_TABLET | Freq: Two times a day (BID) | ORAL | 1 refills | Status: DC

## 2024-05-17 NOTE — Patient Instructions (Addendum)
 Medication Instructions:  No changes   *If you need a refill on your cardiac medications before your next appointment, please call your pharmacy*   Lab Work: BMET CBC If you have labs (blood work) drawn today and your tests are completely normal, you will receive your results only by: MyChart Message (if you have MyChart) OR A paper copy in the mail If you have any lab test that is abnormal or we need to change your treatment, we will call you to review the results.   Testing/Procedures: Not needed   Follow-Up: At [facility], you and your health needs are our priority.  As part of our continuing mission to provide you with exceptional heart care, we have created designated Provider Care Teams.  These Care Teams include your primary Cardiologist (physician) and Advanced Practice Providers (APPs -  Physician Assistants and Nurse Practitioners) who all work together to provide you with the care you need, when you need it.     Your next appointment:   6 month(s)  The format for your next appointment:   In Person  Provider:   Vina Gull, MD

## 2024-05-18 ENCOUNTER — Ambulatory Visit: Payer: Self-pay | Admitting: Emergency Medicine

## 2024-05-18 LAB — BASIC METABOLIC PANEL WITH GFR
BUN/Creatinine Ratio: 15 (ref 10–24)
BUN: 22 mg/dL (ref 10–36)
CO2: 23 mmol/L (ref 20–29)
Calcium: 9.5 mg/dL (ref 8.6–10.2)
Chloride: 101 mmol/L (ref 96–106)
Creatinine, Ser: 1.48 mg/dL — AB (ref 0.76–1.27)
Glucose: 123 mg/dL — AB (ref 70–99)
Potassium: 4.6 mmol/L (ref 3.5–5.2)
Sodium: 140 mmol/L (ref 134–144)
eGFR: 44 mL/min/1.73 — AB (ref >59)

## 2024-05-18 LAB — CBC
Hematocrit: 36 % — ABNORMAL LOW (ref 37.5–51.0)
Hemoglobin: 11.4 g/dL — ABNORMAL LOW (ref 13.0–17.7)
MCH: 30.2 pg (ref 26.6–33.0)
MCHC: 31.7 g/dL (ref 31.5–35.7)
MCV: 96 fL (ref 79–97)
Platelets: 181 x10E3/uL (ref 150–450)
RBC: 3.77 x10E6/uL — ABNORMAL LOW (ref 4.14–5.80)
RDW: 14.2 % (ref 11.6–15.4)
WBC: 11 x10E3/uL — ABNORMAL HIGH (ref 3.4–10.8)

## 2024-05-18 MED ORDER — APIXABAN 2.5 MG PO TABS
2.5000 mg | ORAL_TABLET | Freq: Two times a day (BID) | ORAL | 3 refills | Status: AC
Start: 2024-05-18 — End: ?

## 2024-05-18 MED ORDER — APIXABAN 5 MG PO TABS: 5.0000 mg | ORAL_TABLET | Freq: Two times a day (BID) | ORAL | 3 refills | Status: DC

## 2024-05-18 NOTE — Addendum Note (Signed)
 Addended by: ELAINE MIRIAM BRAVO on: 05/18/2024 06:23 PM   Modules accepted: Orders

## 2024-05-18 NOTE — Addendum Note (Signed)
 Addended by: Nature Vogelsang E on: 05/18/2024 04:03 AM   Modules accepted: Orders

## 2024-05-23 ENCOUNTER — Other Ambulatory Visit: Payer: Self-pay | Admitting: Internal Medicine

## 2024-05-27 ENCOUNTER — Other Ambulatory Visit: Payer: Self-pay | Admitting: Family Medicine

## 2024-05-27 DIAGNOSIS — I10 Essential (primary) hypertension: Secondary | ICD-10-CM

## 2024-05-27 DIAGNOSIS — I739 Peripheral vascular disease, unspecified: Secondary | ICD-10-CM

## 2024-05-29 ENCOUNTER — Other Ambulatory Visit: Payer: Self-pay | Admitting: Internal Medicine

## 2024-05-29 ENCOUNTER — Other Ambulatory Visit: Payer: Self-pay | Admitting: Family Medicine

## 2024-05-29 DIAGNOSIS — E781 Pure hyperglyceridemia: Secondary | ICD-10-CM

## 2024-05-31 ENCOUNTER — Ambulatory Visit

## 2024-05-31 NOTE — Telephone Encounter (Signed)
 Late entry. Spoke with patient and Dr. Okey on 05/18/2024. Dr. Okey agreed we will not increase patient's eliquis  dosing at this time. He is to stay on the reduced dosing (2.5 mg BID) given borderline kidney function (Cr. 1.48), increased fall/bleeding risk. Called and discussed the risks and benefits with the patient, who was agreeable to the plan.

## 2024-06-12 ENCOUNTER — Other Ambulatory Visit: Payer: Self-pay | Admitting: Family Medicine

## 2024-06-12 DIAGNOSIS — I10 Essential (primary) hypertension: Secondary | ICD-10-CM

## 2024-08-23 ENCOUNTER — Other Ambulatory Visit: Payer: Self-pay | Admitting: Family Medicine

## 2024-08-23 DIAGNOSIS — I10 Essential (primary) hypertension: Secondary | ICD-10-CM

## 2024-08-23 DIAGNOSIS — I739 Peripheral vascular disease, unspecified: Secondary | ICD-10-CM

## 2024-08-28 ENCOUNTER — Other Ambulatory Visit: Payer: Self-pay | Admitting: Family Medicine

## 2024-08-28 DIAGNOSIS — E781 Pure hyperglyceridemia: Secondary | ICD-10-CM

## 2024-09-07 ENCOUNTER — Other Ambulatory Visit: Payer: Self-pay | Admitting: Family Medicine

## 2024-09-07 DIAGNOSIS — I10 Essential (primary) hypertension: Secondary | ICD-10-CM
